# Patient Record
Sex: Male | Born: 1945
Health system: Southern US, Community
[De-identification: ages and names within clinical notes are randomized; demographics above are authoritative.]

## PROBLEM LIST (undated history)

## (undated) DIAGNOSIS — N183 Chronic kidney disease, stage 3 unspecified: Secondary | ICD-10-CM

## (undated) DIAGNOSIS — I10 Essential (primary) hypertension: Secondary | ICD-10-CM

## (undated) DIAGNOSIS — Z0189 Encounter for other specified special examinations: Secondary | ICD-10-CM

## (undated) DIAGNOSIS — D332 Benign neoplasm of brain, unspecified: Secondary | ICD-10-CM

## (undated) DIAGNOSIS — J9600 Acute respiratory failure, unspecified whether with hypoxia or hypercapnia: Secondary | ICD-10-CM

## (undated) DIAGNOSIS — N189 Chronic kidney disease, unspecified: Secondary | ICD-10-CM

## (undated) DIAGNOSIS — J189 Pneumonia, unspecified organism: Secondary | ICD-10-CM

## (undated) DIAGNOSIS — I469 Cardiac arrest, cause unspecified: Secondary | ICD-10-CM

## (undated) DIAGNOSIS — Z452 Encounter for adjustment and management of vascular access device: Secondary | ICD-10-CM

## (undated) DIAGNOSIS — R0602 Shortness of breath: Secondary | ICD-10-CM

## (undated) DIAGNOSIS — I4901 Ventricular fibrillation: Secondary | ICD-10-CM

## (undated) DIAGNOSIS — I5020 Unspecified systolic (congestive) heart failure: Secondary | ICD-10-CM

## (undated) DIAGNOSIS — Z86711 Personal history of pulmonary embolism: Secondary | ICD-10-CM

## (undated) DIAGNOSIS — I499 Cardiac arrhythmia, unspecified: Secondary | ICD-10-CM

## (undated) HISTORY — DX: Cardiac arrest, cause unspecified: I49.01

## (undated) HISTORY — DX: Chronic kidney disease, unspecified: N18.9

## (undated) HISTORY — DX: Pneumonia, unspecified organism: J18.9

## (undated) HISTORY — DX: Cardiac arrhythmia, unspecified: I49.9

## (undated) HISTORY — DX: Cardiac arrest, cause unspecified: I46.9

## (undated) HISTORY — DX: Unspecified systolic (congestive) heart failure: I50.20

## (undated) HISTORY — DX: Acute respiratory failure, unspecified whether with hypoxia or hypercapnia: J96.00

## (undated) HISTORY — DX: Encounter for other specified special examinations: Z01.89

## (undated) HISTORY — DX: Shortness of breath: R06.02

## (undated) HISTORY — DX: Chronic kidney disease, stage 3 unspecified: N18.30

## (undated) HISTORY — DX: Encounter for adjustment and management of vascular access device: Z45.2

## (undated) HISTORY — DX: Personal history of pulmonary embolism: Z86.711

## (undated) HISTORY — PX: CARDIAC CATHETERIZATION: SHX172

## (undated) HISTORY — DX: Benign neoplasm of brain, unspecified: D33.2

## (undated) HISTORY — PX: APPENDECTOMY: SHX54

## (undated) HISTORY — PX: HERNIA REPAIR: SHX51

---

## 2016-01-12 DIAGNOSIS — R42 Dizziness and giddiness: Secondary | ICD-10-CM | POA: Diagnosis not present

## 2016-01-12 DIAGNOSIS — D496 Neoplasm of unspecified behavior of brain: Secondary | ICD-10-CM | POA: Diagnosis not present

## 2016-03-30 DIAGNOSIS — E785 Hyperlipidemia, unspecified: Secondary | ICD-10-CM | POA: Diagnosis not present

## 2016-03-30 DIAGNOSIS — R42 Dizziness and giddiness: Secondary | ICD-10-CM | POA: Diagnosis not present

## 2016-03-30 DIAGNOSIS — E782 Mixed hyperlipidemia: Secondary | ICD-10-CM | POA: Diagnosis not present

## 2016-03-30 DIAGNOSIS — I2510 Atherosclerotic heart disease of native coronary artery without angina pectoris: Secondary | ICD-10-CM | POA: Diagnosis not present

## 2016-03-30 DIAGNOSIS — I447 Left bundle-branch block, unspecified: Secondary | ICD-10-CM | POA: Diagnosis not present

## 2016-03-30 DIAGNOSIS — I10 Essential (primary) hypertension: Secondary | ICD-10-CM | POA: Diagnosis not present

## 2016-03-30 DIAGNOSIS — N400 Enlarged prostate without lower urinary tract symptoms: Secondary | ICD-10-CM | POA: Diagnosis not present

## 2016-03-30 DIAGNOSIS — I82409 Acute embolism and thrombosis of unspecified deep veins of unspecified lower extremity: Secondary | ICD-10-CM | POA: Diagnosis not present

## 2016-04-27 DIAGNOSIS — I2510 Atherosclerotic heart disease of native coronary artery without angina pectoris: Secondary | ICD-10-CM | POA: Diagnosis not present

## 2016-04-27 DIAGNOSIS — I447 Left bundle-branch block, unspecified: Secondary | ICD-10-CM | POA: Diagnosis not present

## 2016-05-23 DIAGNOSIS — D496 Neoplasm of unspecified behavior of brain: Secondary | ICD-10-CM | POA: Diagnosis not present

## 2016-05-25 DIAGNOSIS — D496 Neoplasm of unspecified behavior of brain: Secondary | ICD-10-CM | POA: Diagnosis not present

## 2016-05-25 DIAGNOSIS — D320 Benign neoplasm of cerebral meninges: Secondary | ICD-10-CM | POA: Diagnosis not present

## 2016-06-17 DIAGNOSIS — I447 Left bundle-branch block, unspecified: Secondary | ICD-10-CM | POA: Diagnosis not present

## 2016-06-17 DIAGNOSIS — E785 Hyperlipidemia, unspecified: Secondary | ICD-10-CM | POA: Diagnosis not present

## 2016-06-17 DIAGNOSIS — I10 Essential (primary) hypertension: Secondary | ICD-10-CM | POA: Diagnosis not present

## 2016-06-17 DIAGNOSIS — I2510 Atherosclerotic heart disease of native coronary artery without angina pectoris: Secondary | ICD-10-CM | POA: Diagnosis not present

## 2016-08-11 DIAGNOSIS — Z23 Encounter for immunization: Secondary | ICD-10-CM | POA: Diagnosis not present

## 2016-10-13 DIAGNOSIS — I2510 Atherosclerotic heart disease of native coronary artery without angina pectoris: Secondary | ICD-10-CM | POA: Diagnosis not present

## 2016-10-17 DIAGNOSIS — E785 Hyperlipidemia, unspecified: Secondary | ICD-10-CM | POA: Diagnosis not present

## 2016-10-17 DIAGNOSIS — I447 Left bundle-branch block, unspecified: Secondary | ICD-10-CM | POA: Diagnosis not present

## 2016-10-17 DIAGNOSIS — I2510 Atherosclerotic heart disease of native coronary artery without angina pectoris: Secondary | ICD-10-CM | POA: Diagnosis not present

## 2016-10-17 DIAGNOSIS — E781 Pure hyperglyceridemia: Secondary | ICD-10-CM | POA: Diagnosis not present

## 2016-10-17 DIAGNOSIS — I10 Essential (primary) hypertension: Secondary | ICD-10-CM | POA: Diagnosis not present

## 2016-11-14 DIAGNOSIS — I447 Left bundle-branch block, unspecified: Secondary | ICD-10-CM | POA: Diagnosis not present

## 2017-01-13 DIAGNOSIS — M545 Low back pain: Secondary | ICD-10-CM | POA: Diagnosis not present

## 2017-02-15 DIAGNOSIS — L821 Other seborrheic keratosis: Secondary | ICD-10-CM | POA: Diagnosis not present

## 2017-04-17 DIAGNOSIS — I447 Left bundle-branch block, unspecified: Secondary | ICD-10-CM | POA: Diagnosis not present

## 2017-04-17 DIAGNOSIS — I10 Essential (primary) hypertension: Secondary | ICD-10-CM | POA: Diagnosis not present

## 2017-04-17 DIAGNOSIS — E781 Pure hyperglyceridemia: Secondary | ICD-10-CM | POA: Diagnosis not present

## 2017-04-17 DIAGNOSIS — I2510 Atherosclerotic heart disease of native coronary artery without angina pectoris: Secondary | ICD-10-CM | POA: Diagnosis not present

## 2017-04-17 DIAGNOSIS — E785 Hyperlipidemia, unspecified: Secondary | ICD-10-CM | POA: Diagnosis not present

## 2017-06-23 DIAGNOSIS — Z23 Encounter for immunization: Secondary | ICD-10-CM | POA: Diagnosis not present

## 2017-12-04 ENCOUNTER — Emergency Department (HOSPITAL_BASED_OUTPATIENT_CLINIC_OR_DEPARTMENT_OTHER): Payer: Medicare Other

## 2017-12-04 ENCOUNTER — Other Ambulatory Visit: Payer: Self-pay

## 2017-12-04 ENCOUNTER — Encounter (HOSPITAL_BASED_OUTPATIENT_CLINIC_OR_DEPARTMENT_OTHER): Payer: Self-pay | Admitting: Emergency Medicine

## 2017-12-04 ENCOUNTER — Observation Stay (HOSPITAL_BASED_OUTPATIENT_CLINIC_OR_DEPARTMENT_OTHER)
Admission: EM | Admit: 2017-12-04 | Discharge: 2017-12-05 | Disposition: A | Discharge status: Home / Self Care | Payer: Medicare Other | Attending: Internal Medicine | Admitting: Internal Medicine

## 2017-12-04 DIAGNOSIS — R9431 Abnormal electrocardiogram [ECG] [EKG]: Secondary | ICD-10-CM | POA: Insufficient documentation

## 2017-12-04 DIAGNOSIS — I493 Ventricular premature depolarization: Secondary | ICD-10-CM | POA: Insufficient documentation

## 2017-12-04 DIAGNOSIS — I700 Atherosclerosis of aorta: Secondary | ICD-10-CM | POA: Insufficient documentation

## 2017-12-04 DIAGNOSIS — Z86711 Personal history of pulmonary embolism: Secondary | ICD-10-CM | POA: Insufficient documentation

## 2017-12-04 DIAGNOSIS — R079 Chest pain, unspecified: Secondary | ICD-10-CM

## 2017-12-04 DIAGNOSIS — R911 Solitary pulmonary nodule: Secondary | ICD-10-CM

## 2017-12-04 DIAGNOSIS — I2510 Atherosclerotic heart disease of native coronary artery without angina pectoris: Secondary | ICD-10-CM | POA: Insufficient documentation

## 2017-12-04 DIAGNOSIS — I447 Left bundle-branch block, unspecified: Secondary | ICD-10-CM | POA: Insufficient documentation

## 2017-12-04 DIAGNOSIS — Z79899 Other long term (current) drug therapy: Secondary | ICD-10-CM | POA: Insufficient documentation

## 2017-12-04 DIAGNOSIS — I10 Essential (primary) hypertension: Secondary | ICD-10-CM | POA: Insufficient documentation

## 2017-12-04 DIAGNOSIS — R0789 Other chest pain: Secondary | ICD-10-CM | POA: Diagnosis not present

## 2017-12-04 HISTORY — DX: Essential (primary) hypertension: I10

## 2017-12-04 HISTORY — DX: Chest pain, unspecified: R07.9

## 2017-12-04 LAB — BASIC METABOLIC PANEL
ANION GAP: 8 (ref 5–15)
BUN: 24 mg/dL — ABNORMAL HIGH (ref 6–20)
CHLORIDE: 105 mmol/L (ref 101–111)
CO2: 23 mmol/L (ref 22–32)
Calcium: 8.8 mg/dL — ABNORMAL LOW (ref 8.9–10.3)
Creatinine, Ser: 1.38 mg/dL — ABNORMAL HIGH (ref 0.61–1.24)
GFR calc non Af Amer: 49 mL/min — ABNORMAL LOW (ref >60)
GFR, EST AFRICAN AMERICAN: 57 mL/min — AB (ref >60)
Glucose, Bld: 122 mg/dL — ABNORMAL HIGH (ref 65–99)
Potassium: 3.6 mmol/L (ref 3.5–5.1)
Sodium: 136 mmol/L (ref 135–145)

## 2017-12-04 LAB — CBC WITH DIFFERENTIAL/PLATELET
BASOS ABS: 0 10*3/uL (ref 0.0–0.1)
BASOS PCT: 0 %
Eosinophils Absolute: 0.1 10*3/uL (ref 0.0–0.7)
Eosinophils Relative: 2 %
HEMATOCRIT: 39.2 % (ref 39.0–52.0)
Hemoglobin: 13.5 g/dL (ref 13.0–17.0)
Lymphocytes Relative: 35 %
Lymphs Abs: 2.3 10*3/uL (ref 0.7–4.0)
MCH: 30.9 pg (ref 26.0–34.0)
MCHC: 34.4 g/dL (ref 30.0–36.0)
MCV: 89.7 fL (ref 78.0–100.0)
MONO ABS: 0.6 10*3/uL (ref 0.1–1.0)
MONOS PCT: 9 %
NEUTROS ABS: 3.5 10*3/uL (ref 1.7–7.7)
Neutrophils Relative %: 54 %
Platelets: 242 10*3/uL (ref 150–400)
RBC: 4.37 MIL/uL (ref 4.22–5.81)
RDW: 13.4 % (ref 11.5–15.5)
WBC: 6.5 10*3/uL (ref 4.0–10.5)

## 2017-12-04 LAB — TROPONIN I: Troponin I: <0.03 ng/mL (ref <0.03)

## 2017-12-04 MED ORDER — ASPIRIN 81 MG PO CHEW
324.0000 mg | CHEWABLE_TABLET | Freq: Once | ORAL | Status: AC
  Administered 2017-12-04: 324 mg via ORAL
  Filled 2017-12-04: qty 4

## 2017-12-04 MED ORDER — IOPAMIDOL (ISOVUE-370) INJECTION 76%
100.0000 mL | Freq: Once | INTRAVENOUS | Status: AC | PRN
  Administered 2017-12-04: 80 mL via INTRAVENOUS

## 2017-12-04 MED ORDER — KETOROLAC TROMETHAMINE 30 MG/ML IJ SOLN
15.0000 mg | Freq: Once | INTRAMUSCULAR | Status: AC
  Administered 2017-12-04: 15 mg via INTRAVENOUS
  Filled 2017-12-04: qty 1

## 2017-12-04 NOTE — ED Notes (Signed)
Pt. Wife came to RN and asked about Pt. Being admitted RN explained that it is a process and as soon as I hear about a bed I  Will come to them and let them know.

## 2017-12-04 NOTE — ED Provider Notes (Signed)
[facility] Provider Note   CSN: [id number] Arrival date & time: 12/04/17  1100     History   Chief Complaint Chief Complaint  Patient presents with  . Chest Pain    HPI Timothy Moran is a 72 y.o. male.  The history is provided by the patient.  Chest Pain   This is a new problem. The current episode started more than 2 days ago. The problem occurs rarely. The problem has not changed since onset.Associated with: UNKNOWN. The pain is present in the substernal region. The pain is moderate. The quality of the pain is described as dull. The pain does not radiate. Pertinent negatives include no abdominal pain, no back pain, no claudication, no cough, no diaphoresis, no dizziness, no exertional chest pressure, no fever, no headaches, no hemoptysis, no irregular heartbeat, no leg pain, no lower extremity edema, no malaise/fatigue, no nausea, no near-syncope, no numbness, no orthopnea, no palpitations, no PND, no shortness of breath, no sputum production, no syncope, no vomiting and no weakness. He has tried nothing for the symptoms. The treatment provided no relief. Risk factors include being elderly and male gender.  His past medical history is significant for hypertension and PE.  Pertinent negatives for past medical history include no valve disorder.  Pertinent negatives for family medical history include: no early MI.  Procedure history is negative for cardiac catheterization.    Past Medical History:  Diagnosis Date  . Hypertension     There are no active problems to display for this patient.     Home Medications    Prior to Admission medications   Medication Sig Start Date End Date Taking? Authorizing Provider  losartan (COZAAR) 25 MG tablet Take 25 mg by mouth daily.   Yes [provider]    Family History History reviewed. No pertinent family history.  Social History Social History   Tobacco Use  . Smoking status: Never  Smoker  . Smokeless tobacco: Never Used  Substance Use Topics  . Alcohol use: Not on file  . Drug use: Not on file     Allergies   Patient has no known allergies.   Review of Systems Review of Systems  Constitutional: Negative for diaphoresis, fever and malaise/fatigue.  Respiratory: Negative for cough, hemoptysis, sputum production and shortness of breath.   Cardiovascular: Positive for chest pain. Negative for palpitations, orthopnea, claudication, leg swelling, syncope, PND and near-syncope.  Gastrointestinal: Negative for abdominal pain, nausea and vomiting.  Musculoskeletal: Negative for back pain.  Neurological: Negative for dizziness, weakness, numbness and headaches.  All other systems reviewed and are negative.    Physical Exam Updated Vital Signs BP 128/87 (BP Location: Right Arm)   Pulse (!) 52   Temp 98.2 F (36.8 C) (Oral)   Resp 18   Ht 6\' 2"  (1.88 m)   Wt 95.3 kg (210 lb)   SpO2 95%   BMI 26.96 kg/m   Physical Exam  Constitutional: He is oriented to person, place, and time. He appears well-developed and well-nourished. No distress.  HENT:  Head: Normocephalic and atraumatic.  Mouth/Throat: No oropharyngeal exudate.  Eyes: Pupils are equal, round, and reactive to light. Conjunctivae are normal.  Neck: Normal range of motion. Neck supple.  Cardiovascular: Normal rate, regular rhythm, normal heart sounds and intact distal pulses.  Pulmonary/Chest: Effort normal and breath sounds normal. No stridor. He has no wheezes. He has no rales.  Abdominal: Soft. Bowel sounds are normal. He exhibits no mass. There  is no tenderness. There is no rebound and no guarding.  Musculoskeletal: Normal range of motion.  Neurological: He is alert and oriented to person, place, and time. He displays normal reflexes.  Skin: Skin is warm and dry. Capillary refill takes less than 2 seconds.  Psychiatric: He has a normal mood and affect.     ED Treatments / Results  Labs (all  labs ordered are listed, but only abnormal results are displayed)  Results for orders placed or performed during the hospital encounter of 12/04/17  CBC with Differential/Platelet  Result Value Ref Range   WBC 6.5 4.0 - 10.5 K/uL   RBC 4.37 4.22 - 5.81 MIL/uL   Hemoglobin 13.5 13.0 - 17.0 g/dL   HCT 39.2 39.0 - 52.0 %   MCV 89.7 78.0 - 100.0 fL   MCH 30.9 26.0 - 34.0 pg   MCHC 34.4 30.0 - 36.0 g/dL   RDW 13.4 11.5 - 15.5 %   Platelets 242 150 - 400 K/uL   Neutrophils Relative % 54 %   Neutro Abs 3.5 1.7 - 7.7 K/uL   Lymphocytes Relative 35 %   Lymphs Abs 2.3 0.7 - 4.0 K/uL   Monocytes Relative 9 %   Monocytes Absolute 0.6 0.1 - 1.0 K/uL   Eosinophils Relative 2 %   Eosinophils Absolute 0.1 0.0 - 0.7 K/uL   Basophils Relative 0 %   Basophils Absolute 0.0 0.0 - 0.1 K/uL  Basic metabolic panel  Result Value Ref Range   Sodium 136 135 - 145 mmol/L   Potassium 3.6 3.5 - 5.1 mmol/L   Chloride 105 101 - 111 mmol/L   CO2 23 22 - 32 mmol/L   Glucose, Bld 122 (H) 65 - 99 mg/dL   BUN 24 (H) 6 - 20 mg/dL   Creatinine, Ser 1.38 (H) 0.61 - 1.24 mg/dL   Calcium 8.8 (L) 8.9 - 10.3 mg/dL   GFR calc non Af Amer 49 (L) >60 mL/min   GFR calc Af Amer 57 (L) >60 mL/min   Anion gap 8 5 - 15  Troponin I  Result Value Ref Range   Troponin I <0.03 <0.03 ng/mL   Ct Angio Chest Pe W And/or Wo Contrast  Result Date: 12/04/2017 CLINICAL DATA:  Chest pain EXAM: CT ANGIOGRAPHY CHEST WITH CONTRAST TECHNIQUE: Multidetector CT imaging of the chest was performed using the standard protocol during bolus administration of intravenous contrast. Multiplanar CT image reconstructions and MIPs were obtained to evaluate the vascular anatomy. CONTRAST:  30mL ISOVUE-370 IOPAMIDOL (ISOVUE-370) INJECTION 76% COMPARISON:  None. FINDINGS: Cardiovascular: There is no demonstrable pulmonary embolus. There is no thoracic aortic aneurysm. No dissection is evident. It should be noted that the contrast bolus is less than optimal  for assessment for potential dissection. There are foci of atherosclerotic calcification in visualized great vessels. There is also atherosclerotic calcification in the aorta. There are multiple foci of coronary artery calcification. There is no pericardial effusion or pericardial thickening evident. Mediastinum/Nodes: Thyroid appears normal. There are subcentimeter mediastinal and hilar lymph nodes. There is no adenopathy size criteria in the thoracic region. No esophageal lesions are evident. Lungs/Pleura: There is a 1.0 x 0.8 x 0.7 cm nodular opacity in the posterior segment of the right upper lobe. This opacity is best seen on axial slice 24 series 5, coronal slice 96 series 7, and sagittal slice 55 series 8. No other focal parenchymal lung nodular lesions are evident. There is slight bibasilar atelectasis. No pleural effusion or pleural thickening evident.  Upper Abdomen: There is mild reflux of contrast into the inferior vena cava and hepatic veins. Visualized upper abdominal structures otherwise appear unremarkable. Musculoskeletal: There is degenerative change in the thoracic spine with a degree of diffuse idiopathic skeletal hyperostosis. No blastic or lytic bone lesions are evident. No chest wall lesions are appreciable. Review of the MIP images confirms the above findings. IMPRESSION: 1. 1.0 x 0.8 x 0.7 cm nodular opacity in the posterior segment right upper lobe. Consider one of the following in 3 months for both low-risk and high-risk individuals: (a) repeat chest CT, (b) follow-up PET-CT, or (c) tissue sampling. This recommendation follows the consensus statement: Guidelines for Management of Incidental Pulmonary Nodules Detected on CT Images: From the Fleischner Society 2017; Radiology 2017; 284:228-243. 2. No demonstrable pulmonary embolus. No thoracic aortic aneurysm. No dissection seen, although it must be noted that the contrast bolus is less than optimal for assessment for potential thoracic  aortic dissection. 3. No evident thoracic adenopathy by size criteria. Several subcentimeter mediastinal and hilar lymph nodes are noted. 4. There is aortic atherosclerosis. There is great vessel and coronary artery calcification. 5. Mild reflux of contrast into the inferior vena cava and hepatic veins may indicate a degree of increase in right heart pressure. 6. Diffuse idiopathic skeletal hyperostosis in the thoracic spine noted. Aortic Atherosclerosis (ICD10-I70.0). Electronically Signed   By: Lowella Grip III M.D.   On: 12/04/2017 12:31    EKG EKG Interpretation  Date/Time:  Monday December 04 2017 11:16:45 EDT Ventricular Rate:  60 PR Interval:  196 QRS Duration: 142 QT Interval:  446 QTC Calculation: 446 R Axis:   -39 Text Interpretation:  Sinus rhythm with occasional Premature ventricular complexes Left axis deviation Left bundle branch block Confirmed by Lamara Brecht ([id number]) on 12/04/2017 11:22:28 AM   Radiology Ct Angio Chest Pe W And/or Wo Contrast  Result Date: 12/04/2017 CLINICAL DATA:  Chest pain EXAM: CT ANGIOGRAPHY CHEST WITH CONTRAST TECHNIQUE: Multidetector CT imaging of the chest was performed using the standard protocol during bolus administration of intravenous contrast. Multiplanar CT image reconstructions and MIPs were obtained to evaluate the vascular anatomy. CONTRAST:  69mL ISOVUE-370 IOPAMIDOL (ISOVUE-370) INJECTION 76% COMPARISON:  None. FINDINGS: Cardiovascular: There is no demonstrable pulmonary embolus. There is no thoracic aortic aneurysm. No dissection is evident. It should be noted that the contrast bolus is less than optimal for assessment for potential dissection. There are foci of atherosclerotic calcification in visualized great vessels. There is also atherosclerotic calcification in the aorta. There are multiple foci of coronary artery calcification. There is no pericardial effusion or pericardial thickening evident. Mediastinum/Nodes: Thyroid appears  normal. There are subcentimeter mediastinal and hilar lymph nodes. There is no adenopathy size criteria in the thoracic region. No esophageal lesions are evident. Lungs/Pleura: There is a 1.0 x 0.8 x 0.7 cm nodular opacity in the posterior segment of the right upper lobe. This opacity is best seen on axial slice 24 series 5, coronal slice 96 series 7, and sagittal slice 55 series 8. No other focal parenchymal lung nodular lesions are evident. There is slight bibasilar atelectasis. No pleural effusion or pleural thickening evident. Upper Abdomen: There is mild reflux of contrast into the inferior vena cava and hepatic veins. Visualized upper abdominal structures otherwise appear unremarkable. Musculoskeletal: There is degenerative change in the thoracic spine with a degree of diffuse idiopathic skeletal hyperostosis. No blastic or lytic bone lesions are evident. No chest wall lesions are appreciable. Review of the MIP images confirms the  above findings. IMPRESSION: 1. 1.0 x 0.8 x 0.7 cm nodular opacity in the posterior segment right upper lobe. Consider one of the following in 3 months for both low-risk and high-risk individuals: (a) repeat chest CT, (b) follow-up PET-CT, or (c) tissue sampling. This recommendation follows the consensus statement: Guidelines for Management of Incidental Pulmonary Nodules Detected on CT Images: From the Fleischner Society 2017; Radiology 2017; 284:228-243. 2. No demonstrable pulmonary embolus. No thoracic aortic aneurysm. No dissection seen, although it must be noted that the contrast bolus is less than optimal for assessment for potential thoracic aortic dissection. 3. No evident thoracic adenopathy by size criteria. Several subcentimeter mediastinal and hilar lymph nodes are noted. 4. There is aortic atherosclerosis. There is great vessel and coronary artery calcification. 5. Mild reflux of contrast into the inferior vena cava and hepatic veins may indicate a degree of increase in  right heart pressure. 6. Diffuse idiopathic skeletal hyperostosis in the thoracic spine noted. Aortic Atherosclerosis (ICD10-I70.0). Electronically Signed   By: Lowella Grip III M.D.   On: 12/04/2017 12:31    Procedures Procedures (including critical care time)  Medications Ordered in ED Medications  iopamidol (ISOVUE-370) 76 % injection 100 mL (80 mLs Intravenous Contrast Given 12/04/17 1210)  aspirin chewable tablet 324 mg (324 mg Oral Given 12/04/17 1321)  ketorolac (TORADOL) 30 MG/ML injection 15 mg (15 mg Intravenous Given 12/04/17 1323)       Final Clinical Impressions(s) / ED Diagnoses   Final diagnoses:  Other chest pain  Pulmonary nodule  Abnormal EKG    Heart SCORE IS 6, GIVEN THIS SCORE WILL NEED INPATIENT RULE OUT.    [facility], no doctor in this area and will need medication management   Santana Edell, MD 12/04/17 1429

## 2017-12-04 NOTE — ED Notes (Signed)
Lights turned out for Pt. And comfort measures done for Pt. To assist for Pt. To rest while he awaits transport to [facility].

## 2017-12-04 NOTE — Progress Notes (Signed)
Patient is a 72yo who recently moved to [place] from [place].  Episodic CP lasting minutes x 2-3 days.  No DOE.  He has LBBB - ?new.  H/o PE, CTA negative.  Negative troponin.  Dr. Randal Buba does not feel comfortable with outpatient f/u.  Will observe on telemetry.  It is not clear that inpatient cardiology consult will be necessary based on the current information available.  Carlyon Shadow, M.D.

## 2017-12-04 NOTE — ED Notes (Signed)
Pt. Wife name is Delcie Roch and her number is [phone]

## 2017-12-04 NOTE — ED Triage Notes (Addendum)
Reports intermittent left sided chest pain, worsening in length today for the past week.  Denies shortness of breath, nausea, vomiting.  Sent from urgent care

## 2017-12-04 NOTE — ED Notes (Signed)
Placed pink mattress under Pt. For comfort due to Pt. Staying in ED so long.  Pt. Has been offered food and drink due to long stay in ED.

## 2017-12-05 DIAGNOSIS — R0789 Other chest pain: Secondary | ICD-10-CM | POA: Diagnosis not present

## 2017-12-05 LAB — CBC WITH DIFFERENTIAL/PLATELET
BASOS ABS: 0 10*3/uL (ref 0.0–0.1)
BASOS PCT: 0 %
EOS ABS: 0.2 10*3/uL (ref 0.0–0.7)
EOS PCT: 2 %
HEMATOCRIT: 40.2 % (ref 39.0–52.0)
Hemoglobin: 13.7 g/dL (ref 13.0–17.0)
Lymphocytes Relative: 30 %
Lymphs Abs: 1.9 10*3/uL (ref 0.7–4.0)
MCH: 30.9 pg (ref 26.0–34.0)
MCHC: 34.1 g/dL (ref 30.0–36.0)
MCV: 90.7 fL (ref 78.0–100.0)
MONO ABS: 0.5 10*3/uL (ref 0.1–1.0)
Monocytes Relative: 7 %
NEUTROS ABS: 3.8 10*3/uL (ref 1.7–7.7)
Neutrophils Relative %: 60 %
PLATELETS: 245 10*3/uL (ref 150–400)
RBC: 4.43 MIL/uL (ref 4.22–5.81)
RDW: 13.8 % (ref 11.5–15.5)
WBC: 6.4 10*3/uL (ref 4.0–10.5)

## 2017-12-05 LAB — BASIC METABOLIC PANEL
Anion gap: 9 (ref 5–15)
BUN: 27 mg/dL — ABNORMAL HIGH (ref 6–20)
CALCIUM: 8.9 mg/dL (ref 8.9–10.3)
CO2: 24 mmol/L (ref 22–32)
CREATININE: 1.35 mg/dL — AB (ref 0.61–1.24)
Chloride: 107 mmol/L (ref 101–111)
GFR, EST AFRICAN AMERICAN: 58 mL/min — AB (ref >60)
GFR, EST NON AFRICAN AMERICAN: 50 mL/min — AB (ref >60)
Glucose, Bld: 147 mg/dL — ABNORMAL HIGH (ref 65–99)
Potassium: 3.7 mmol/L (ref 3.5–5.1)
SODIUM: 140 mmol/L (ref 135–145)

## 2017-12-05 LAB — TROPONIN I: Troponin I: <0.03 ng/mL (ref <0.03)

## 2017-12-05 NOTE — ED Notes (Signed)
ED Provider at bedside. 

## 2017-12-05 NOTE — ED Provider Notes (Signed)
71 yo M who is awaiting admission for moderate risk chest pain with a heart score of 6.  The patient has had no continued symptoms while in the ED, he had a second troponin that was repeated 24 hours after he had had his first one.  Both of these are negative.  I feel that this is unlikely to be a acute cardiac event and feel that this is essentially ruled him out.  He had a PE scan done yesterday that was negative for PE as well.  The patient boarded in the emergency department for over 24 hours.  I discussed with him the risks and benefits of continued waiting for admission for possible stress testing.  At this time the patient elects for outpatient follow-up with a cardiologist.  Gave him the information for the cardiologist on call.  1:14 PM:  I have discussed the diagnosis/risks/treatment options with the patient and family and believe the pt to be eligible for discharge home to follow-up with Cards. We also discussed returning to the ED immediately if new or worsening sx occur. We discussed the sx which are most concerning (e.g., sudden worsening pain, fever, inability to tolerate by mouth) that necessitate immediate return. Medications administered to the patient during their visit and any new prescriptions provided to the patient are listed below.  Medications given during this visit Medications  iopamidol (ISOVUE-370) 76 % injection 100 mL (80 mLs Intravenous Contrast Given 12/04/17 1210)  aspirin chewable tablet 324 mg (324 mg Oral Given 12/04/17 1321)  ketorolac (TORADOL) 30 MG/ML injection 15 mg (15 mg Intravenous Given 12/04/17 1323)     The patient appears reasonably screen and/or stabilized for discharge and I doubt any other medical condition or other [facility] requiring further screening, evaluation, or treatment in the ED at this time prior to discharge.     Deno Etienne, DO 12/05/17 1314

## 2017-12-05 NOTE — Discharge Instructions (Signed)
Try Zantac or Pepcid twice a day for the next week.  Please follow-up with a cardiologist.  He had a lung nodule that was seen incidentally on your CT scan.  This needs follow-up with your family doctor.  You need a repeat image.

## 2017-12-13 DIAGNOSIS — N401 Enlarged prostate with lower urinary tract symptoms: Secondary | ICD-10-CM | POA: Diagnosis not present

## 2017-12-13 DIAGNOSIS — Z86711 Personal history of pulmonary embolism: Secondary | ICD-10-CM | POA: Diagnosis not present

## 2017-12-13 DIAGNOSIS — I447 Left bundle-branch block, unspecified: Secondary | ICD-10-CM | POA: Diagnosis not present

## 2017-12-13 DIAGNOSIS — N183 Chronic kidney disease, stage 3 (moderate): Secondary | ICD-10-CM | POA: Diagnosis not present

## 2017-12-13 DIAGNOSIS — D332 Benign neoplasm of brain, unspecified: Secondary | ICD-10-CM | POA: Diagnosis not present

## 2017-12-13 DIAGNOSIS — R079 Chest pain, unspecified: Secondary | ICD-10-CM | POA: Diagnosis not present

## 2017-12-13 DIAGNOSIS — I10 Essential (primary) hypertension: Secondary | ICD-10-CM | POA: Diagnosis not present

## 2017-12-19 DIAGNOSIS — D332 Benign neoplasm of brain, unspecified: Secondary | ICD-10-CM | POA: Insufficient documentation

## 2017-12-19 DIAGNOSIS — N183 Chronic kidney disease, stage 3 unspecified: Secondary | ICD-10-CM | POA: Insufficient documentation

## 2017-12-19 DIAGNOSIS — Z86711 Personal history of pulmonary embolism: Secondary | ICD-10-CM | POA: Insufficient documentation

## 2017-12-19 DIAGNOSIS — I447 Left bundle-branch block, unspecified: Secondary | ICD-10-CM | POA: Insufficient documentation

## 2017-12-19 HISTORY — DX: Personal history of pulmonary embolism: Z86.711

## 2017-12-19 HISTORY — DX: Left bundle-branch block, unspecified: I44.7

## 2017-12-19 HISTORY — DX: Benign neoplasm of brain, unspecified: D33.2

## 2017-12-19 HISTORY — DX: Chronic kidney disease, stage 3 unspecified: N18.30

## 2017-12-22 ENCOUNTER — Ambulatory Visit (INDEPENDENT_AMBULATORY_CARE_PROVIDER_SITE_OTHER): Payer: Medicare Other | Admitting: Cardiology

## 2017-12-22 ENCOUNTER — Encounter: Payer: Self-pay | Admitting: Cardiology

## 2017-12-22 VITALS — BP 122/72 | HR 66 | Ht 73.0 in | Wt 212.8 lb

## 2017-12-22 DIAGNOSIS — R072 Precordial pain: Secondary | ICD-10-CM | POA: Diagnosis not present

## 2017-12-22 DIAGNOSIS — I493 Ventricular premature depolarization: Secondary | ICD-10-CM

## 2017-12-22 DIAGNOSIS — N183 Chronic kidney disease, stage 3 unspecified: Secondary | ICD-10-CM

## 2017-12-22 DIAGNOSIS — I447 Left bundle-branch block, unspecified: Secondary | ICD-10-CM

## 2017-12-22 DIAGNOSIS — R001 Bradycardia, unspecified: Secondary | ICD-10-CM | POA: Diagnosis not present

## 2017-12-22 HISTORY — DX: Bradycardia, unspecified: R00.1

## 2017-12-22 MED ORDER — ASPIRIN EC 81 MG PO TBEC: 81.0000 mg | DELAYED_RELEASE_TABLET | Freq: Every day | ORAL | 3 refills | Status: DC

## 2017-12-22 NOTE — Patient Instructions (Signed)
Medication Instructions:  Your physician has recommended you make the following change in your medication:  START aspirin 81 mg daily   Labwork: Your physician recommends that you return for lab work when you come back to the office for your holter monitor appointment: lipid panel. Please fast before this appointment.   Testing/Procedures: You had an EKG today.   Your physician has requested that you have an echocardiogram. Echocardiography is a painless test that uses sound waves to create images of your heart. It provides your doctor with information about the size and shape of your heart and how well your heart's chambers and valves are working. This procedure takes approximately one hour. There are no restrictions for this procedure.  Your physician has recommended that you wear a holter monitor. Holter monitors are medical devices that record the heart's electrical activity. Doctors most often use these monitors to diagnose arrhythmias. Arrhythmias are problems with the speed or rhythm of the heartbeat. The monitor is a small, portable device. You can wear one while you do your normal daily activities. This is usually used to diagnose what is causing palpitations/syncope (passing out). Wear for 24 hours.   Follow-Up: Your physician recommends that you schedule a follow-up appointment in: 1 month.  Any Other Special Instructions Will Be Listed Below (If Applicable).     If you need a refill on your cardiac medications before your next appointment, please call your pharmacy.

## 2017-12-22 NOTE — Progress Notes (Signed)
Cardiology Consultation:    Date:  12/22/2017   ID:  Timothy Moran, DOB Feb 24, 1946, MRN [id number]  PCP:  Timothy Kid, MD  Cardiologist:  Timothy Campus, MD   Referring MD: Timothy Kid, MD   Chief Complaint  Patient presents with  . Chest Pain  I have chest pain  History of Present Illness:    Timothy Moran is a 72 y.o. male who is being seen today for the evaluation of atypical chest pain at the request of Via, [name], MD.  He recently relocated from [place].  Few days ago he started experiencing some chest pain.  Pain is short lasting only split-second not related to exertion happening at different situation 1 of the day he woke up in the morning he had back pain.  Again very short lasting not related to exercise.  There is no shortness of breath no tightness squeezing pressure burning in the chest.  He does have history of pulmonary emboli that happened in 2010 and then a year later.  It was assessed as being provoked pulmonary emboli secondary to the fact that he travels a lot and spent a lot of time in the plane.  Interestingly his cardiologist taking care of him wanted to continue his anticoagulation and indefinitely.  Patient did not like this idea make a long story short he ended up going hypercoagulation workup which was perfectly normal and that anticoagulant she has been discontinued.  He does not travel anymore.  Denies having any shortness of breath right now the symptoms while he was having his pulmonary emboli was shortness of breath.  He is very active he wears Fitbit and every single day he does at least 10,000 steps.  He does not have any exertional chest pain or shortness of breath.  Is not have swelling of lower extremities no pain in the calf. He never have any heart trouble.  He was told however a few years ago he does have extrasystole now he reports when he check his blood pressure he would feel sometimes skipped beats.  Past Medical History:  Diagnosis Date    . Benign brain tumor ([place])   . Chronic kidney disease    Stage 3  . History of pulmonary embolus (PE)   . Hypertension       Current Medications: Current Meds  Medication Sig  . B Complex-C (SUPER B COMPLEX PO) Take 1 tablet by mouth daily.  . cetirizine (ZYRTEC) 10 MG tablet Take 10 mg by mouth daily.  . Cholecalciferol (VITAMIN D3) 50000 units TABS Take 1 tablet by mouth once a week.  . losartan (COZAAR) 25 MG tablet Take 1 tablet by mouth daily.  . Magnesium 400 MG CAPS Take 400 mg by mouth daily.   Marland Kitchen oxymetazoline (AFRIN) 0.05 % nasal spray Place 1 spray into both nostrils daily as needed for congestion.  . Saw Palmetto, Serenoa repens, (SAW PALMETTO BERRY PO) Take 1 tablet by mouth daily.     Allergies:   Patient has no known allergies.   Social History   Socioeconomic History  . Marital status: Married    Spouse name: Not on file  . Number of children: Not on file  . Years of education: Not on file  . Highest education level: Not on file  Occupational History  . Not on file  Social Needs  . Financial resource strain: Not on file  . Food insecurity:    Worry: Not on file    Inability: Not on file  .  Transportation needs:    Medical: Not on file    Non-medical: Not on file  Tobacco Use  . Smoking status: Never Smoker  . Smokeless tobacco: Never Used  Substance and Sexual Activity  . Alcohol use: Yes  . Drug use: Never  . Sexual activity: Not on file  Lifestyle  . Physical activity:    Days per week: Not on file    Minutes per session: Not on file  . Stress: Not on file  Relationships  . Social connections:    Talks on phone: Not on file    Gets together: Not on file    Attends religious service: Not on file    Active member of club or organization: Not on file    Attends meetings of clubs or organizations: Not on file    Relationship status: Not on file  Other Topics Concern  . Not on file  Social History Narrative  . Not on file     Family  History: The patient's family history includes Brain cancer in his sister; Diabetes in his mother; Leukemia in his brother; Melanoma in his brother. ROS:   Please see the history of present illness.    All 14 point review of systems negative except as described per history of present illness.  EKGs/Labs/Other Studies Reviewed:    The following studies were reviewed today:   EKG:  EKG is  ordered today.  The ekg ordered today demonstrates EKG done in our office today shows sinus bradycardia PVCs left bundle branch block  Recent Labs: 12/05/2017: BUN 27; Creatinine, Ser 1.35; Hemoglobin 13.7; Platelets 245; Potassium 3.7; Sodium 140  Recent Lipid Panel No results found for: CHOL, TRIG, HDL, CHOLHDL, VLDL, LDLCALC, LDLDIRECT  Physical Exam:    VS:  BP 122/72 (BP Location: Right Arm)   Pulse 66   Ht 6\' 1"  (1.854 m)   Wt 212 lb 12.8 oz (96.5 kg)   SpO2 95%   BMI 28.08 kg/m     Wt Readings from Last 3 Encounters:  12/22/17 212 lb 12.8 oz (96.5 kg)  12/04/17 210 lb (95.3 kg)     GEN:  Well nourished, well developed in no acute distress HEENT: Normal NECK: No JVD; No carotid bruits LYMPHATICS: No lymphadenopathy CARDIAC: RRR, no murmurs, no rubs, no gallops RESPIRATORY:  Clear to auscultation without rales, wheezing or rhonchi  ABDOMEN: Soft, non-tender, non-distended MUSCULOSKELETAL:  No edema; No deformity  SKIN: Warm and dry NEUROLOGIC:  Alert and oriented x 3 PSYCHIATRIC:  Normal affect   ASSESSMENT:    1. Left bundle branch block   2. Chronic kidney disease, stage III (moderate) (HCC)   3. Precordial pain   4. Extrasystole, ventricular   5. Sinus bradycardia    PLAN:    In order of problems listed above:  1. Left bundle branch block: Incidental discovery he is not sure if this is new or old.  He remembers vaguely that he did have some extrasystole and somebody make some comments about abnormal EKG.  He did have a stress test done a few years ago and he was able to  walk on the treadmill which make me to think that he did not have left bundle branch block at that time.  I think the key will be trying to assess the significance of this finding.  Therefore I will ask him to have an echocardiogram.  I will also ask him to wear 24 hours Holter monitor to see how much extrasystole he  has.  In the future after those 2 tests will be done he will most likely require stress test which can be done only one way Lexiscan.  But decision about this test will be made after those first 2 test will come back.  I will ask him to start taking one baby aspirin every single day I will no change any other medications. 2. Chronic kidney disease his creatinine at baseline is 1.35 with GFR of 50.  That need to be followed we need to avoid any nephrotoxic medications. 3. Sinus bradycardia with no episode of syncope.  Still he does have to wear Holter monitor to see if he got any significant slowing down of his heart rate. 4. History of pulmonary emboli which was assessed as being provoked secondary to frequent long flights that he took.  On top of that he did have anticoagulation workup done which was negative we will try to get a copy of that workup.  Would like to see him back in my office in about 1 month or sooner if he has any problem also ask him to let me know if pain change characteristic if you have dizziness or passing out.   Medication Adjustments/Labs and Tests Ordered: Current medicines are reviewed at length with the patient today.  Concerns regarding medicines are outlined above.  No orders of the defined types were placed in this encounter.  No orders of the defined types were placed in this encounter.   Signed, Park Liter, MD, [facility]. 12/22/2017 3:28 PM    [place]

## 2017-12-27 ENCOUNTER — Ambulatory Visit (HOSPITAL_BASED_OUTPATIENT_CLINIC_OR_DEPARTMENT_OTHER)
Admission: RE | Admit: 2017-12-27 | Discharge: 2017-12-27 | Disposition: A | Discharge status: Home / Self Care | Payer: Medicare Other | Source: Ambulatory Visit | Attending: Cardiology | Admitting: Cardiology

## 2017-12-27 ENCOUNTER — Ambulatory Visit: Payer: Medicare Other

## 2017-12-27 DIAGNOSIS — I083 Combined rheumatic disorders of mitral, aortic and tricuspid valves: Secondary | ICD-10-CM | POA: Diagnosis not present

## 2017-12-27 DIAGNOSIS — N183 Chronic kidney disease, stage 3 unspecified: Secondary | ICD-10-CM

## 2017-12-27 DIAGNOSIS — I493 Ventricular premature depolarization: Secondary | ICD-10-CM

## 2017-12-27 DIAGNOSIS — I447 Left bundle-branch block, unspecified: Secondary | ICD-10-CM

## 2017-12-27 DIAGNOSIS — R001 Bradycardia, unspecified: Secondary | ICD-10-CM

## 2017-12-27 DIAGNOSIS — R072 Precordial pain: Secondary | ICD-10-CM

## 2017-12-27 NOTE — Progress Notes (Signed)
Echocardiogram 2D Echocardiogram has been performed.  Joelene Millin 12/27/2017, 3:41 PM

## 2018-01-08 ENCOUNTER — Telehealth: Payer: Self-pay | Admitting: *Deleted

## 2018-01-08 NOTE — Telephone Encounter (Signed)
Reminded patient's wife, Delcie Roch, to have patient come back into the office one day this week for repeat lab work. Monika verbalized understanding. No further questions.

## 2018-01-10 DIAGNOSIS — R072 Precordial pain: Secondary | ICD-10-CM | POA: Diagnosis not present

## 2018-01-10 DIAGNOSIS — N183 Chronic kidney disease, stage 3 (moderate): Secondary | ICD-10-CM | POA: Diagnosis not present

## 2018-01-10 DIAGNOSIS — I447 Left bundle-branch block, unspecified: Secondary | ICD-10-CM | POA: Diagnosis not present

## 2018-01-10 DIAGNOSIS — R001 Bradycardia, unspecified: Secondary | ICD-10-CM | POA: Diagnosis not present

## 2018-01-10 DIAGNOSIS — I493 Ventricular premature depolarization: Secondary | ICD-10-CM | POA: Diagnosis not present

## 2018-01-11 LAB — LIPID PANEL
CHOL/HDL RATIO: 5.1 ratio — AB (ref 0.0–5.0)
Cholesterol, Total: 203 mg/dL — ABNORMAL HIGH (ref 100–199)
HDL: 40 mg/dL (ref >39)
LDL CALC: 116 mg/dL — AB (ref 0–99)
TRIGLYCERIDES: 236 mg/dL — AB (ref 0–149)
VLDL CHOLESTEROL CAL: 47 mg/dL — AB (ref 5–40)

## 2018-01-24 DIAGNOSIS — L247 Irritant contact dermatitis due to plants, except food: Secondary | ICD-10-CM | POA: Diagnosis not present

## 2018-01-24 DIAGNOSIS — I447 Left bundle-branch block, unspecified: Secondary | ICD-10-CM | POA: Diagnosis not present

## 2018-01-24 DIAGNOSIS — D332 Benign neoplasm of brain, unspecified: Secondary | ICD-10-CM | POA: Diagnosis not present

## 2018-01-24 DIAGNOSIS — I10 Essential (primary) hypertension: Secondary | ICD-10-CM | POA: Diagnosis not present

## 2018-01-29 ENCOUNTER — Encounter: Payer: Self-pay | Admitting: Cardiology

## 2018-01-29 ENCOUNTER — Ambulatory Visit (INDEPENDENT_AMBULATORY_CARE_PROVIDER_SITE_OTHER): Payer: Medicare Other | Admitting: Cardiology

## 2018-01-29 ENCOUNTER — Encounter: Payer: Self-pay | Admitting: *Deleted

## 2018-01-29 VITALS — BP 130/66 | HR 68 | Ht 73.0 in | Wt 213.1 lb

## 2018-01-29 DIAGNOSIS — I712 Thoracic aortic aneurysm, without rupture: Secondary | ICD-10-CM | POA: Insufficient documentation

## 2018-01-29 DIAGNOSIS — N183 Chronic kidney disease, stage 3 unspecified: Secondary | ICD-10-CM

## 2018-01-29 DIAGNOSIS — Z86711 Personal history of pulmonary embolism: Secondary | ICD-10-CM | POA: Diagnosis not present

## 2018-01-29 DIAGNOSIS — I7121 Aneurysm of the ascending aorta, without rupture: Secondary | ICD-10-CM

## 2018-01-29 DIAGNOSIS — R001 Bradycardia, unspecified: Secondary | ICD-10-CM | POA: Diagnosis not present

## 2018-01-29 DIAGNOSIS — I447 Left bundle-branch block, unspecified: Secondary | ICD-10-CM

## 2018-01-29 HISTORY — DX: Thoracic aortic aneurysm, without rupture: I71.2

## 2018-01-29 HISTORY — DX: Aneurysm of the ascending aorta, without rupture: I71.21

## 2018-01-29 NOTE — Patient Instructions (Addendum)
Medication Instructions:  Your physician recommends that you continue on your current medications as directed. Please refer to the Current Medication list given to you today.   Labwork: Your physician recommends that you return for lab work today: BMP.  Testing/Procedures: Non-Cardiac CT scanning, (CAT scanning), is a noninvasive, special x-ray that produces cross-sectional images of the body using x-rays and a computer. CT scans help physicians diagnose and treat medical conditions. For some CT exams, a contrast material is used to enhance visibility in the area of the body being studied. CT scans provide greater clarity and reveal more details than regular x-ray exams.  Your physician has requested that you have a lexiscan myoview. For further information please visit [email or web address]. Please follow instruction sheet, as given.   Follow-Up: Your physician recommends that you schedule a follow-up appointment in: 1 month.   If you need a refill on your cardiac medications before your next appointment, please call your pharmacy.   Thank you for choosing [facility]! Robyne Peers, RN [phone]

## 2018-01-29 NOTE — Progress Notes (Signed)
Cardiology Office Note:    Date:  01/29/2018   ID:  Timothy Moran, DOB 1946-01-13, MRN [id number]  PCP:  Dineen Kid, MD  Cardiologist:  Jenne Campus, MD    Referring MD: Dineen Kid, MD   Chief Complaint  Patient presents with  . 1 month follow up  Doing well  History of Present Illness:    Timothy Moran is a 72 y.o. male with left bundle branch block.  Echocardiogram was done which showed ejection fraction 40 to 45%.  He was also identified to have a ascending aortic aneurysm measuring 4.3 cm.  Overall he is asymptomatic no chest pain tightness squeezing pressure burning chest.  He is very active.  He did have to monitor which showed plenty of supraventricular as well as ventricle ectopy.  No sustained arrhythmias.  First-degree AV block with bradycardia but no critical.  I will ask him to have a Lexiscan to make sure there is no inducible ischemia.  Because of the aneurysm of the ascending orotate will schedule him to have a CAT scan of the chest after Chem-7 will be checked.  Past Medical History:  Diagnosis Date  . Benign brain tumor ([place])   . Chronic kidney disease    Stage 3  . History of pulmonary embolus (PE)   . Hypertension       Current Medications: Current Meds  Medication Sig  . aspirin EC 81 MG tablet Take 1 tablet (81 mg total) by mouth daily.  . B Complex-C (SUPER B COMPLEX PO) Take 1 tablet by mouth daily.  . cetirizine (ZYRTEC) 10 MG tablet Take 10 mg by mouth daily.  . Cholecalciferol (VITAMIN D3) 50000 units TABS Take 1 tablet by mouth once a week.  . Magnesium 400 MG CAPS Take 400 mg by mouth daily.   Marland Kitchen oxymetazoline (AFRIN) 0.05 % nasal spray Place 1 spray into both nostrils daily as needed for congestion.  . Saw Palmetto, Serenoa repens, (SAW PALMETTO BERRY PO) Take 1 tablet by mouth daily.  . valsartan (DIOVAN) 160 MG tablet Take 1 tablet by mouth daily.     Allergies:   Patient has no known allergies.   Social History   Socioeconomic  History  . Marital status: Married    Spouse name: Not on file  . Number of children: Not on file  . Years of education: Not on file  . Highest education level: Not on file  Occupational History  . Not on file  Social Needs  . Financial resource strain: Not on file  . Food insecurity:    Worry: Not on file    Inability: Not on file  . Transportation needs:    Medical: Not on file    Non-medical: Not on file  Tobacco Use  . Smoking status: Never Smoker  . Smokeless tobacco: Never Used  Substance and Sexual Activity  . Alcohol use: Yes  . Drug use: Never  . Sexual activity: Not on file  Lifestyle  . Physical activity:    Days per week: Not on file    Minutes per session: Not on file  . Stress: Not on file  Relationships  . Social connections:    Talks on phone: Not on file    Gets together: Not on file    Attends religious service: Not on file    Active member of club or organization: Not on file    Attends meetings of clubs or organizations: Not on file    Relationship status: Not  on file  Other Topics Concern  . Not on file  Social History Narrative  . Not on file     Family History: The patient's family history includes Brain cancer in his sister; Diabetes in his mother; Leukemia in his brother; Melanoma in his brother. ROS:   Please see the history of present illness.    All 14 point review of systems negative except as described per history of present illness  EKGs/Labs/Other Studies Reviewed:      Recent Labs: 12/05/2017: BUN 27; Creatinine, Ser 1.35; Hemoglobin 13.7; Platelets 245; Potassium 3.7; Sodium 140  Recent Lipid Panel    Component Value Date/Time   CHOL 203 (H) 01/10/2018 0000   TRIG 236 (H) 01/10/2018 0000   HDL 40 01/10/2018 0000   CHOLHDL 5.1 (H) 01/10/2018 0000   LDLCALC 116 (H) 01/10/2018 0000    Physical Exam:    VS:  BP 130/66   Pulse 68   Ht 6\' 1"  (1.854 m)   Wt 213 lb 1.9 oz (96.7 kg)   SpO2 (!) 69%   BMI 28.12 kg/m      Wt Readings from Last 3 Encounters:  01/29/18 213 lb 1.9 oz (96.7 kg)  12/22/17 212 lb 12.8 oz (96.5 kg)  12/04/17 210 lb (95.3 kg)     GEN:  Well nourished, well developed in no acute distress HEENT: Normal NECK: No JVD; No carotid bruits LYMPHATICS: No lymphadenopathy CARDIAC: RRR, no murmurs, no rubs, no gallops RESPIRATORY:  Clear to auscultation without rales, wheezing or rhonchi  ABDOMEN: Soft, non-tender, non-distended MUSCULOSKELETAL:  No edema; No deformity  SKIN: Warm and dry LOWER EXTREMITIES: no swelling NEUROLOGIC:  Alert and oriented x 3 PSYCHIATRIC:  Normal affect   ASSESSMENT:    1. Left bundle branch block   2. Sinus bradycardia   3. Personal history of pulmonary embolism   4. Ascending aortic aneurysm (HCC)    PLAN:    In order of problems listed above:  1. Left bundle branch block: Ejection fraction 30 to 45%.  On ARB.  Will not put him on beta-blocker because of bradycardia and first-degree AV block. 2. History of PE.  Stable no evidence of pulmonary hypertension. 3. Ascending aortic aneurysm: Chem-7 will be done and then CT Angie of his chest to check on the size of the aneurysm.  We will see him back in 1 month or sooner if he has a problem   Medication Adjustments/Labs and Tests Ordered: Current medicines are reviewed at length with the patient today.  Concerns regarding medicines are outlined above.  No orders of the defined types were placed in this encounter.  Medication changes: No orders of the defined types were placed in this encounter.   Signed, Park Liter, MD, [facility] 01/29/2018 4:27 PM    [place]

## 2018-01-30 ENCOUNTER — Telehealth: Payer: Self-pay | Admitting: *Deleted

## 2018-01-30 LAB — BASIC METABOLIC PANEL
BUN/Creatinine Ratio: 16 (ref 10–24)
BUN: 19 mg/dL (ref 8–27)
CALCIUM: 9.7 mg/dL (ref 8.6–10.2)
CHLORIDE: 105 mmol/L (ref 96–106)
CO2: 22 mmol/L (ref 20–29)
Creatinine, Ser: 1.21 mg/dL (ref 0.76–1.27)
GFR calc Af Amer: 68 mL/min/{1.73_m2} (ref >59)
GFR, EST NON AFRICAN AMERICAN: 59 mL/min/{1.73_m2} — AB (ref >59)
GLUCOSE: 98 mg/dL (ref 65–99)
POTASSIUM: 4.8 mmol/L (ref 3.5–5.2)
Sodium: 142 mmol/L (ref 134–144)

## 2018-01-30 NOTE — Telephone Encounter (Signed)
Opened in error

## 2018-02-01 ENCOUNTER — Telehealth (HOSPITAL_COMMUNITY): Payer: Self-pay | Admitting: *Deleted

## 2018-02-01 NOTE — Telephone Encounter (Signed)
Left message on voicemail per DPR in reference to upcoming appointment scheduled on 02/06/18 with detailed instructions given per Myocardial Perfusion Study Information Sheet for the test. LM to arrive 15 minutes early, and that it is imperative to arrive on time for appointment to keep from having the test rescheduled. If you need to cancel or reschedule your appointment, please call the office within 24 hours of your appointment. Failure to do so may result in a cancellation of your appointment, and a $50 no show fee. Phone number given for call back for any questions. Samrat Hayward Jacqueline    

## 2018-02-06 ENCOUNTER — Telehealth (HOSPITAL_COMMUNITY): Payer: Self-pay | Admitting: *Deleted

## 2018-02-06 ENCOUNTER — Encounter (HOSPITAL_COMMUNITY): Payer: Medicare Other

## 2018-02-06 NOTE — Telephone Encounter (Signed)
Patient was a no show for nuclear appointment on 02/06/18. Called patient to reschedule appointment and reviewed instructions with patient.  Kirstie Peri, RN

## 2018-02-12 ENCOUNTER — Ambulatory Visit (HOSPITAL_COMMUNITY): Payer: Medicare Other | Attending: Cardiovascular Disease

## 2018-02-12 DIAGNOSIS — R9431 Abnormal electrocardiogram [ECG] [EKG]: Secondary | ICD-10-CM | POA: Diagnosis not present

## 2018-02-12 DIAGNOSIS — R079 Chest pain, unspecified: Secondary | ICD-10-CM | POA: Insufficient documentation

## 2018-02-12 DIAGNOSIS — I447 Left bundle-branch block, unspecified: Secondary | ICD-10-CM | POA: Diagnosis not present

## 2018-02-12 DIAGNOSIS — R9439 Abnormal result of other cardiovascular function study: Secondary | ICD-10-CM | POA: Diagnosis not present

## 2018-02-12 LAB — MYOCARDIAL PERFUSION IMAGING
CHL CUP NUCLEAR SDS: 1
CHL CUP NUCLEAR SRS: 14
CHL CUP RESTING HR STRESS: 54 {beats}/min
LHR: 0.31
LV dias vol: 147 mL (ref 62–150)
LV sys vol: 81 mL
NUC STRESS TID: 1.03
Peak HR: 78 {beats}/min
SSS: 15

## 2018-02-12 MED ORDER — REGADENOSON 0.4 MG/5ML IV SOLN
0.4000 mg | Freq: Once | INTRAVENOUS | Status: AC
  Administered 2018-02-12: 0.4 mg via INTRAVENOUS

## 2018-02-12 MED ORDER — TECHNETIUM TC 99M TETROFOSMIN IV KIT
32.0000 | PACK | Freq: Once | INTRAVENOUS | Status: AC | PRN
  Administered 2018-02-12: 32 via INTRAVENOUS
  Filled 2018-02-12: qty 32

## 2018-02-12 MED ORDER — TECHNETIUM TC 99M TETROFOSMIN IV KIT
10.9000 | PACK | Freq: Once | INTRAVENOUS | Status: AC | PRN
  Administered 2018-02-12: 10.9 via INTRAVENOUS
  Filled 2018-02-12: qty 11

## 2018-02-13 ENCOUNTER — Ambulatory Visit (INDEPENDENT_AMBULATORY_CARE_PROVIDER_SITE_OTHER)
Admission: RE | Admit: 2018-02-13 | Discharge: 2018-02-13 | Disposition: A | Discharge status: Home / Self Care | Payer: Medicare Other | Source: Ambulatory Visit | Attending: Cardiology | Admitting: Cardiology

## 2018-02-13 ENCOUNTER — Telehealth: Payer: Self-pay | Admitting: *Deleted

## 2018-02-13 DIAGNOSIS — I712 Thoracic aortic aneurysm, without rupture: Secondary | ICD-10-CM

## 2018-02-13 DIAGNOSIS — R911 Solitary pulmonary nodule: Secondary | ICD-10-CM

## 2018-02-13 MED ORDER — IOPAMIDOL (ISOVUE-370) INJECTION 76%
100.0000 mL | Freq: Once | INTRAVENOUS | Status: AC | PRN
  Administered 2018-02-13: 100 mL via INTRAVENOUS

## 2018-02-13 NOTE — Telephone Encounter (Signed)
Left message to return call regarding lexiscan results.

## 2018-02-14 NOTE — Telephone Encounter (Signed)
Patient informed of lexiscan results and CTA chest results. Patient advised of pulmonology referral per [name]. Informed patient that he would be contacted to schedule this appointment. Patient verbalized understanding. No further questions.

## 2018-02-14 NOTE — Addendum Note (Signed)
Addended by: Austin Miles on: 02/14/2018 08:56 AM   Modules accepted: Orders

## 2018-03-01 ENCOUNTER — Ambulatory Visit: Payer: Medicare Other | Admitting: Cardiology

## 2018-03-02 ENCOUNTER — Encounter: Payer: Self-pay | Admitting: Cardiology

## 2018-03-02 ENCOUNTER — Ambulatory Visit (INDEPENDENT_AMBULATORY_CARE_PROVIDER_SITE_OTHER): Payer: Medicare Other | Admitting: Cardiology

## 2018-03-02 VITALS — BP 140/80 | HR 58 | Ht 73.0 in | Wt 212.0 lb

## 2018-03-02 DIAGNOSIS — I447 Left bundle-branch block, unspecified: Secondary | ICD-10-CM

## 2018-03-02 DIAGNOSIS — Z86711 Personal history of pulmonary embolism: Secondary | ICD-10-CM

## 2018-03-02 DIAGNOSIS — I712 Thoracic aortic aneurysm, without rupture: Secondary | ICD-10-CM

## 2018-03-02 DIAGNOSIS — I7121 Aneurysm of the ascending aorta, without rupture: Secondary | ICD-10-CM

## 2018-03-02 DIAGNOSIS — N400 Enlarged prostate without lower urinary tract symptoms: Secondary | ICD-10-CM | POA: Diagnosis not present

## 2018-03-02 NOTE — Progress Notes (Signed)
Cardiology Office Note:    Date:  03/02/2018   ID:  Timothy Moran, DOB 1946-05-16, MRN [id number]  PCP:  Dineen Kid, MD  Cardiologist:  Jenne Campus, MD    Referring MD: Dineen Kid, MD   Chief Complaint  Patient presents with  . Follow-up  Doing well  History of Present Illness:    Timothy Moran is a 72 y.o. male with left bundle branch block.  He was sent to [place] for evaluation echo cardiac and was done showed ejection fraction 40 to 45%.  Stress test was done which showed no evidence of ischemia.  However, on echocardiogram he was identified to have enlargement of the aorta CT of his chest was done after that showed mild ascending aortic aneurysm measuring 4.1 cm.  Incidental finding was some nodule suspicious for cancer.  He is scheduled to have PET scan. Clinically he is doing well denies have any chest pain tightness squeezing pressure burning chest.  Work hard and have no difficulty doing it he is also planning to start exercise on the regular basis which I encouraged him to do.  Past Medical History:  Diagnosis Date  . Benign brain tumor ([place])   . Chronic kidney disease    Stage 3  . History of pulmonary embolus (PE)   . Hypertension     Past Surgical History:  Procedure Laterality Date  . APPENDECTOMY    . HERNIA REPAIR      Current Medications: Current Meds  Medication Sig  . aspirin EC 81 MG tablet Take 1 tablet (81 mg total) by mouth daily.  . B Complex-C (SUPER B COMPLEX PO) Take 1 tablet by mouth daily.  . cetirizine (ZYRTEC) 10 MG tablet Take 10 mg by mouth daily.  . Cholecalciferol (VITAMIN D3) 5000 units TABS Take 1 tablet by mouth daily.   . Magnesium 400 MG CAPS Take 400 mg by mouth daily.   Marland Kitchen oxymetazoline (AFRIN) 0.05 % nasal spray Place 1 spray into both nostrils daily as needed for congestion.  . Saw Palmetto, Serenoa repens, (SAW PALMETTO BERRY PO) Take 1 tablet by mouth daily.  . valsartan (DIOVAN) 160 MG tablet Take 1 tablet by mouth  daily.     Allergies:   Patient has no known allergies.   Social History   Socioeconomic History  . Marital status: Married    Spouse name: Not on file  . Number of children: Not on file  . Years of education: Not on file  . Highest education level: Not on file  Occupational History  . Not on file  Social Needs  . Financial resource strain: Not on file  . Food insecurity:    Worry: Not on file    Inability: Not on file  . Transportation needs:    Medical: Not on file    Non-medical: Not on file  Tobacco Use  . Smoking status: Never Smoker  . Smokeless tobacco: Never Used  Substance and Sexual Activity  . Alcohol use: Yes  . Drug use: Never  . Sexual activity: Not on file  Lifestyle  . Physical activity:    Days per week: Not on file    Minutes per session: Not on file  . Stress: Not on file  Relationships  . Social connections:    Talks on phone: Not on file    Gets together: Not on file    Attends religious service: Not on file    Active member of club or organization: Not on file  Attends meetings of clubs or organizations: Not on file    Relationship status: Not on file  Other Topics Concern  . Not on file  Social History Narrative  . Not on file     Family History: The patient's family history includes Brain cancer in his sister; Diabetes in his mother; Leukemia in his brother; Melanoma in his brother. ROS:   Please see the history of present illness.    All 14 point review of systems negative except as described per history of present illness  EKGs/Labs/Other Studies Reviewed:      Recent Labs: 12/05/2017: Hemoglobin 13.7; Platelets 245 01/29/2018: BUN 19; Creatinine, Ser 1.21; Potassium 4.8; Sodium 142  Recent Lipid Panel    Component Value Date/Time   CHOL 203 (H) 01/10/2018 0000   TRIG 236 (H) 01/10/2018 0000   HDL 40 01/10/2018 0000   CHOLHDL 5.1 (H) 01/10/2018 0000   LDLCALC 116 (H) 01/10/2018 0000    Physical Exam:    VS:  BP 140/80  (BP Location: Right Arm, Patient Position: Sitting, Cuff Size: Normal)   Pulse (!) 58   Ht 6\' 1"  (1.854 m)   Wt 212 lb (96.2 kg)   SpO2 98%   BMI 27.97 kg/m     Wt Readings from Last 3 Encounters:  03/02/18 212 lb (96.2 kg)  02/12/18 213 lb (96.6 kg)  01/29/18 213 lb 1.9 oz (96.7 kg)     GEN:  Well nourished, well developed in no acute distress HEENT: Normal NECK: No JVD; No carotid bruits LYMPHATICS: No lymphadenopathy CARDIAC: RRR, no murmurs, no rubs, no gallops RESPIRATORY:  Clear to auscultation without rales, wheezing or rhonchi  ABDOMEN: Soft, non-tender, non-distended MUSCULOSKELETAL:  No edema; No deformity  SKIN: Warm and dry LOWER EXTREMITIES: no swelling NEUROLOGIC:  Alert and oriented x 3 PSYCHIATRIC:  Normal affect   ASSESSMENT:    1. Left bundle branch block   2. Ascending aortic aneurysm (HCC)   3. Personal history of pulmonary embolism    PLAN:    In order of problems listed above:  1. Left bundle branch block: Ejection fraction 4045%.  We will get Chem-7 today and based on that we will decide if we can increase dose of Diovan.  He is not a candidate for beta-blocker because of bradycardia. 2. Stenting aortic aneurysm 4.1 cm will require yearly CT.  In the meantime will get his blood pressure under control. 3. History of pulmonary emboli.  Off anticoagulation feeling was that his PE was provoked.  He is to travel a lot spent a lot of time in the plane.  Now he does not do anymore.   Overall clinically he seems to be doing well concern obviously is a pulmonary nodule he will have a PET scan to clarify that.   Medication Adjustments/Labs and Tests Ordered: Current medicines are reviewed at length with the patient today.  Concerns regarding medicines are outlined above.  No orders of the defined types were placed in this encounter.  Medication changes: No orders of the defined types were placed in this encounter.   Signed, Park Liter, MD,  [facility] 03/02/2018 10:09 AM    [place]

## 2018-03-02 NOTE — Patient Instructions (Signed)
Medication Instructions:  Your physician recommends that you continue on your current medications as directed. Please refer to the Current Medication list given to you today.   Labwork: Your physician recommends that you return for lab work today: BMP.   Testing/Procedures: None  Follow-Up: Your physician wants you to follow-up in: 3 months. You will receive a reminder letter in the mail two months in advance. If you don't receive a letter, please call our office to schedule the follow-up appointment.   If you need a refill on your cardiac medications before your next appointment, please call your pharmacy.   Thank you for choosing [facility]! Catherine Lockhart, RN [phone]    

## 2018-03-03 LAB — BASIC METABOLIC PANEL
BUN/Creatinine Ratio: 14 (ref 10–24)
BUN: 18 mg/dL (ref 8–27)
CALCIUM: 9.6 mg/dL (ref 8.6–10.2)
CHLORIDE: 106 mmol/L (ref 96–106)
CO2: 23 mmol/L (ref 20–29)
Creatinine, Ser: 1.27 mg/dL (ref 0.76–1.27)
GFR calc Af Amer: 64 mL/min/{1.73_m2} (ref >59)
GFR calc non Af Amer: 55 mL/min/{1.73_m2} — ABNORMAL LOW (ref >59)
GLUCOSE: 113 mg/dL — AB (ref 65–99)
POTASSIUM: 4.9 mmol/L (ref 3.5–5.2)
Sodium: 145 mmol/L — ABNORMAL HIGH (ref 134–144)

## 2018-03-07 ENCOUNTER — Telehealth: Payer: Self-pay | Admitting: *Deleted

## 2018-03-07 DIAGNOSIS — I447 Left bundle-branch block, unspecified: Secondary | ICD-10-CM

## 2018-03-07 MED ORDER — VALSARTAN 320 MG PO TABS: 320.0000 mg | ORAL_TABLET | Freq: Every day | ORAL | 6 refills | Status: DC

## 2018-03-07 NOTE — Telephone Encounter (Signed)
Patient informed of results and advised to increase valsartan (diovan) from 160 mg to 320 mg daily. New prescription sent in to [facility] in [place] as requested. Patient informed to come into our office on Monday, 03/12/18 for follow up lab work. Patient verbalized understanding. No further questions.

## 2018-03-07 NOTE — Addendum Note (Signed)
Addended by: Austin Miles on: 03/07/2018 09:22 AM   Modules accepted: Orders

## 2018-03-07 NOTE — Telephone Encounter (Signed)
-----   Message from Park Liter, MD sent at 03/05/2018  8:28 AM EDT ----- Double Diovan to 320 mg every day, Chem-7 within next few days

## 2018-03-08 ENCOUNTER — Ambulatory Visit (INDEPENDENT_AMBULATORY_CARE_PROVIDER_SITE_OTHER): Payer: Medicare Other | Admitting: Internal Medicine

## 2018-03-08 ENCOUNTER — Encounter: Payer: Self-pay | Admitting: Internal Medicine

## 2018-03-08 VITALS — BP 112/66 | HR 54 | Ht 73.5 in | Wt 212.0 lb

## 2018-03-08 DIAGNOSIS — I2729 Other secondary pulmonary hypertension: Secondary | ICD-10-CM

## 2018-03-08 DIAGNOSIS — R911 Solitary pulmonary nodule: Secondary | ICD-10-CM | POA: Diagnosis not present

## 2018-03-08 DIAGNOSIS — I2699 Other pulmonary embolism without acute cor pulmonale: Secondary | ICD-10-CM

## 2018-03-08 HISTORY — DX: Solitary pulmonary nodule: R91.1

## 2018-03-08 NOTE — Assessment & Plan Note (Addendum)
CT 12/04/17 1.0 x 0.8 x 0.7 cm nodular opacity in the RUL vs not seen 03/16/10 posterior segment of the right upper lobe. Marland KitchenSpirometry 03/08/2018    FEV1 3.86 (108%)  Ratio 96 s prior rx  - PET ordered 03/08/2018   He is 10 years out from smoking cessation which places him at risk of lung ca which is the most likely dx here and PET scan is the next step though if neg due to relatively small size  would not get him completely cleared and still would need serial  f/u and consideration for early excisional bx (as a neg pet would also indicate no mets)   Discussed in detail all the  indications, usual  risks and alternatives  relative to the benefits with patient who agrees to proceed with w/u as outlined.     Total time devoted to counseling  > 50 % of initial 60 min office visit:  review case with pt/wife discussion of options/alternatives/ personally creating written customized instructions  in presence of pt  then going over those specific  Instructions directly with the pt including how to use all of the meds but in particular covering each new medication in detail and the difference between the maintenance= "automatic" meds and the prns using an action plan format for the latter (If this problem/symptom => do that organization reading Left to right).  Please see AVS from this visit for a full list of these instructions which I personally wrote for this pt and  are unique to this visit.

## 2018-03-08 NOTE — Progress Notes (Signed)
Timothy Moran, male    DOB: [birth year],   MRN: [id number]   Brief patient profile:  74 yowm  Quit smoking s/p PE in 2009 then again in 2011 eval at [facility] rec lifelong anticoag but after [place] hematologist did neg hemophilia w/u rec ok to stop as long as avoided freq travel and had CTa 12/04/17  due to transient non pleuritic cp showing RUL nodule  Which had not changed as of 02/14/18 so referred to pulmonary clinic 03/08/2018 by Dr   Agustin Cree   03/08/2018  ov/Timothy Moran re:  consultatoin re SPN  Chief Complaint  Patient presents with  . Pulmonary Consult    Referred by [name] for eval of incidental pulmonary nodule.   He denies any respiratory co's.   Dyspnea:  Not limited by breathing from desired activities  / very active no problem with yardwork including with pushing mower  Cough: none  SABA use: none    No obvious day to day or daytime variability or assoc excess/ purulent sputum or mucus plugs or hemoptysis or cp or chest tightness, subjective wheeze or overt sinus or hb symptoms.   Sleep: lie flat ok without nocturnal  or early am exacerbation  of respiratory  c/o's or need for noct saba. Also denies any obvious fluctuation of symptoms with weather or environmental changes or other aggravating or alleviating factors except as outlined above   No unusual exposure hx or h/o childhood pna/ asthma or knowledge of premature birth.  Current Allergies, Complete Past Medical History, Past Surgical History, Family History, and Social History were reviewed in [employer] record.  ROS  The following are not active complaints unless bolded Hoarseness, sore throat, dysphagia, dental problems, itching, sneezing,  nasal congestion or discharge of excess mucus or purulent secretions, ear ache,   fever, chills, sweats, unintended wt loss or wt gain, classically pleuritic or exertional cp,  orthopnea pnd or arm/hand swelling  or leg swelling, presyncope, palpitations,  abdominal pain, anorexia, nausea, vomiting, diarrhea  or change in bowel habits or change in bladder habits, change in stools or change in urine, dysuria, hematuria,  rash, arthralgias, visual complaints, headache, numbness, weakness or ataxia or problems with walking or coordination,  change in mood or  memory.            Past Medical History:  Diagnosis Date  . Benign brain tumor ([place])   . Chronic kidney disease    Stage 3  . History of pulmonary embolus (PE)   . Hypertension     Outpatient Medications Prior to Visit  Medication Sig Dispense Refill  . aspirin EC 81 MG tablet Take 1 tablet (81 mg total) by mouth daily. 90 tablet 3  . B Complex-C (SUPER B COMPLEX PO) Take 1 tablet by mouth daily.    . cetirizine (ZYRTEC) 10 MG tablet Take 10 mg by mouth daily.    . Cholecalciferol (VITAMIN D3) 5000 units TABS Take 1 tablet by mouth daily.     . Magnesium 400 MG CAPS Take 400 mg by mouth daily.     Marland Kitchen oxymetazoline (AFRIN) 0.05 % nasal spray Place 1 spray into both nostrils daily as needed for congestion.    . Saw Palmetto, Serenoa repens, (SAW PALMETTO BERRY PO) Take 1 tablet by mouth daily.    . valsartan (DIOVAN) 320 MG tablet Take 1 tablet (320 mg total) by mouth daily. 30 tablet 6   No facility-administered medications prior to visit.  Objective:     BP 112/66 (BP Location: Left Arm, Cuff Size: Normal)   Pulse (!) 54   Ht 6' 1.5" (1.867 m)   Wt 212 lb (96.2 kg)   SpO2 94%   BMI 27.59 kg/m   SpO2: 94 %  RA   Wt Readings from Last 3 Encounters:  03/08/18 212 lb (96.2 kg)  03/02/18 212 lb (96.2 kg)  02/12/18 213 lb (96.6 kg)      HEENT: nl dentition, turbinates bilaterally, and oropharynx. Nl external ear canals without cough reflex   NECK :  without JVD/Nodes/TM/ nl carotid upstrokes bilaterally   LUNGS: no acc muscle use,  Nl contour chest which is clear to A and P bilaterally without cough on insp or exp maneuvers   CV:  RRR  no s3 or murmur or  increase in P2, and no edema   ABD:  soft and nontender with nl inspiratory excursion in the supine position. No bruits or organomegaly appreciated, bowel sounds nl  MS:  Nl gait/ ext warm without deformities, calf tenderness, cyanosis or clubbing No obvious joint restrictions   SKIN: warm and dry without lesions    NEURO:  alert, approp, nl sensorium with  no motor or cerebellar deficits apparent.     I personally reviewed images and agree with radiology impression as follows:   Chest CTa 02/13/18 1. Ascending aortic aneurysm, stable  2. Right upper lobe nodule persists and is worrisome for primary bronchogenic carcinoma. Other lesion size is borderline for PET resolution, PET is recommended to guide further workup.   3. Oblong smoothly marginated nodule along the right hemidiaphragm. A subpleural lymph node could have this appearance. Continued attention on follow-up exams is warranted. 4. Enlarged pulmonary arteries, indicative of pulmonary arterial hypertension. 5. Aortic atherosclerosis Coronary artery calcification.      Assessment   Solitary pulmonary nodule on lung CT CT 12/04/17 1.0 x 0.8 x 0.7 cm nodular opacity in the RUL vs not seen 03/16/10 posterior segment of the right upper lobe. Marland KitchenSpirometry 03/08/2018    FEV1 3.86 (108%)  Ratio 96 s prior rx  - PET ordered 03/08/2018   He is 10 years out from smoking cessation which places him at risk of lung ca which is the most likely dx here and PET scan is the next step though if neg due to relatively small size  would not get him completely cleared and still would need serial  f/u and consideration for early excisional bx (as a neg pet would also indicate no mets)   Discussed in detail all the  indications, usual  risks and alternatives  relative to the benefits with patient who agrees to proceed with w/u as outlined.     Total time devoted to counseling  > 50 % of initial 60 min office visit:  review case with pt/wife  discussion of options/alternatives/ personally creating written customized instructions  in presence of pt  then going over those specific  Instructions directly with the pt including how to use all of the meds but in particular covering each new medication in detail and the difference between the maintenance= "automatic" meds and the prns using an action plan format for the latter (If this problem/symptom => do that organization reading Left to right).  Please see AVS from this visit for a full list of these instructions which I personally wrote for this pt and  are unique to this visit.   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 Marland KitchenSpirometry 03/08/2018    FEV1 3.86 (108%)  Ratio 96 s prior rx  - PET 03/08/2018 >>>    I suspect he did have [place] related to recurrent PE's but with echo now nl and exercise tolerance excellent he does not need further w/u but would be at risk for post op dvt/ PE which would need to be addressed.     Christinia Gully, MD 03/08/2018

## 2018-03-08 NOTE — Patient Instructions (Addendum)
Please see patient coordinator before you leave today  to schedule PET scan and I will call you with results and arrange appropriate follow up then

## 2018-03-09 ENCOUNTER — Encounter: Payer: Self-pay | Admitting: Internal Medicine

## 2018-03-09 DIAGNOSIS — I2729 Other secondary pulmonary hypertension: Secondary | ICD-10-CM

## 2018-03-09 DIAGNOSIS — I2699 Other pulmonary embolism without acute cor pulmonale: Secondary | ICD-10-CM | POA: Insufficient documentation

## 2018-03-09 HISTORY — DX: Other pulmonary embolism without acute cor pulmonale: I26.99

## 2018-03-09 HISTORY — DX: Other secondary pulmonary hypertension: I27.29

## 2018-03-09 NOTE — Assessment & Plan Note (Signed)
CT 12/04/17 1.0 x 0.8 x 0.7 cm nodular opacity in the RUL vs not seen 03/16/10 posterior segment of the right upper lobe. Marland KitchenSpirometry 03/08/2018    FEV1 3.86 (108%)  Ratio 96 s prior rx  - PET 03/08/2018 >>>    I suspect he did have [place] related to recurrent PE's but with echo now nl and exercise tolerance excellent he does not need further w/u but would be at risk for post op dvt/ PE which would need to be addressed.

## 2018-03-13 ENCOUNTER — Ambulatory Visit (HOSPITAL_COMMUNITY)
Admission: RE | Admit: 2018-03-13 | Discharge: 2018-03-13 | Disposition: A | Discharge status: Home / Self Care | Payer: Medicare Other | Source: Ambulatory Visit | Attending: Internal Medicine | Admitting: Internal Medicine

## 2018-03-13 ENCOUNTER — Telehealth: Payer: Self-pay | Admitting: *Deleted

## 2018-03-13 DIAGNOSIS — I447 Left bundle-branch block, unspecified: Secondary | ICD-10-CM | POA: Diagnosis not present

## 2018-03-13 DIAGNOSIS — R911 Solitary pulmonary nodule: Secondary | ICD-10-CM | POA: Insufficient documentation

## 2018-03-13 DIAGNOSIS — R918 Other nonspecific abnormal finding of lung field: Secondary | ICD-10-CM | POA: Diagnosis not present

## 2018-03-13 DIAGNOSIS — I712 Thoracic aortic aneurysm, without rupture: Secondary | ICD-10-CM | POA: Diagnosis not present

## 2018-03-13 LAB — GLUCOSE, CAPILLARY: GLUCOSE-CAPILLARY: 105 mg/dL — AB (ref 70–99)

## 2018-03-13 MED ORDER — FLUDEOXYGLUCOSE F - 18 (FDG) INJECTION
10.3000 | Freq: Once | INTRAVENOUS | Status: AC | PRN
  Administered 2018-03-13: 10.3 via INTRAVENOUS

## 2018-03-13 NOTE — Telephone Encounter (Signed)
Patient informed to come to our office for follow up lab work today or tomorrow. Patient advised he does not have to fast beforehand as we are checking a BMP. Patient verbalized understanding. No further questions.

## 2018-03-13 NOTE — Telephone Encounter (Signed)
Pt was supposed to get lab work 03/12/18 after increasing Valsartan but was unable to make it. Would like to get it done today but has been injected with dye for cat scan should he wait until tomorrow for labs, also should he be fasting. Please advise

## 2018-03-14 ENCOUNTER — Other Ambulatory Visit: Payer: Self-pay | Admitting: *Deleted

## 2018-03-14 DIAGNOSIS — R911 Solitary pulmonary nodule: Secondary | ICD-10-CM

## 2018-03-14 LAB — BASIC METABOLIC PANEL
BUN / CREAT RATIO: 16 (ref 10–24)
BUN: 20 mg/dL (ref 8–27)
CO2: 22 mmol/L (ref 20–29)
CREATININE: 1.27 mg/dL (ref 0.76–1.27)
Calcium: 9.3 mg/dL (ref 8.6–10.2)
Chloride: 102 mmol/L (ref 96–106)
GFR calc Af Amer: 64 mL/min/{1.73_m2} (ref >59)
GFR calc non Af Amer: 55 mL/min/{1.73_m2} — ABNORMAL LOW (ref >59)
Glucose: 95 mg/dL (ref 65–99)
Potassium: 4.6 mmol/L (ref 3.5–5.2)
Sodium: 140 mmol/L (ref 134–144)

## 2018-03-23 ENCOUNTER — Telehealth: Payer: Self-pay | Admitting: Internal Medicine

## 2018-03-23 NOTE — Telephone Encounter (Signed)
Called and spoke with pt regarding scheduling a PET scan; Pt verbalized this has already been handled; he had no further concerns. Nothing further needed.

## 2018-03-23 NOTE — Telephone Encounter (Signed)
PET Scan was done on 03/13/2018 maybe he wants the results

## 2018-03-28 ENCOUNTER — Encounter: Payer: Self-pay | Admitting: Thoracic Surgery (Cardiothoracic Vascular Surgery)

## 2018-03-28 ENCOUNTER — Institutional Professional Consult (permissible substitution) (INDEPENDENT_AMBULATORY_CARE_PROVIDER_SITE_OTHER): Payer: Medicare Other | Admitting: Thoracic Surgery (Cardiothoracic Vascular Surgery)

## 2018-03-28 ENCOUNTER — Other Ambulatory Visit: Payer: Self-pay

## 2018-03-28 VITALS — BP 130/77 | HR 62 | Resp 16 | Ht 73.5 in | Wt 212.0 lb

## 2018-03-28 DIAGNOSIS — I712 Thoracic aortic aneurysm, without rupture, unspecified: Secondary | ICD-10-CM

## 2018-03-28 DIAGNOSIS — D381 Neoplasm of uncertain behavior of trachea, bronchus and lung: Secondary | ICD-10-CM | POA: Diagnosis not present

## 2018-03-28 NOTE — Progress Notes (Signed)
PCP is Via, Timothy Bihari, MD Referring Provider is Timothy Rockers, MD  Chief Complaint  Patient presents with  . Lung Lesion    RULobe per CTA CHEST 02/13/18, PET 03/13/18...ECHO 12/27/17 detected TAA which led to CT CHEST...SPIROMETRY IN OFFICE ONLY    HPI: [name] is sent for consultation regarding a right upper lobe lung nodule.  Timothy Moran is a 72 year old retired gentleman with a past history of pulmonary embolus, possible pulmonary hypertension, hypertension, stage III chronic kidney disease, a benign brain tumor, and a left bundle branch block.  In March of this year he was experiencing chest pain.  That turned out to be reflux.  As part of his work-up he had a CT angiogram.  There is no evidence of pulmonary embolism.  There was a 10 x 8 x 7 mm opacity in the posterior segment of the right upper lobe.  There was a larger opacity in the right lower lobe along the diaphragm.  A follow-up CT measured the nodule at 5 x 12 mm but was essentially unchanged.  The lower lobe nodule measured 9 x 21 mm.  He was referred to Dr. Melvyn Moran.  A PET/CT was done.  It showed mild uptake in the right upper lobe nodule with an SUV of 1.7.  There is no uptake in the right lower lobe nodule.  There was faint uptake below the level of mediastinal blood pool and a right paratracheal lymph node.  He had an EKG done as well.  It showed a left bundle branch block.  An echocardiogram in April showed an ejection fraction of 40 to 45%.  There was dyssynergy of the septum.  Right ventricular function was normal and there was no evidence of pulmonary hypertension.  He smoked for while in college in early 68s.  He quit 50 years ago.  He does not have any shortness of breath with exertion and can walk up 2 flights of stairs easily.  He had the episode of reflux back in March but has not had any chest pain, pressure, or tightness since then.  His appetite is good.  He denies weight loss.  He does have some occasional dizziness  particular when he is up on a ladder. Zubrod Score: At the time of surgery this patient's most appropriate activity status/level should be described as: [x]     0    Normal activity, no symptoms []     1    Restricted in physical strenuous activity but ambulatory, able to do out light work []     2    Ambulatory and capable of self care, unable to do work activities, up and about >50 % of waking hours                              []     3    Only limited self care, in bed greater than 50% of waking hours []     4    Completely disabled, no self care, confined to bed or chair []     5    Moribund  Past Medical History:  Diagnosis Date  . Benign brain tumor ([place])   . Chronic kidney disease    Stage 3  . History of pulmonary embolus (PE)   . Hypertension     Past Surgical History:  Procedure Laterality Date  . APPENDECTOMY    . HERNIA REPAIR      Family History  Problem  Relation Age of Onset  . Diabetes Mother   . Brain cancer Sister   . Melanoma Brother        Radiation Exposure  . Leukemia Brother        Agent Orange    Social History Social History   Tobacco Use  . Smoking status: Former Smoker    Packs/day: 0.50    Years: 5.00    Pack years: 2.50    Types: Cigarettes    Last attempt to quit: 09/12/1968    Years since quitting: 49.5  . Smokeless tobacco: Never Used  Substance Use Topics  . Alcohol use: Yes    Comment: ONE PER WEEK  . Drug use: Never    Current Outpatient Medications  Medication Sig Dispense Refill  . aspirin EC 81 MG tablet Take 1 tablet (81 mg total) by mouth daily. 90 tablet 3  . B Complex-C (SUPER B COMPLEX PO) Take 1 tablet by mouth daily.    . cetirizine (ZYRTEC) 10 MG tablet Take 10 mg by mouth daily.    . Cholecalciferol (VITAMIN D3) 5000 units TABS Take 1 tablet by mouth daily.     . Magnesium 400 MG CAPS Take 400 mg by mouth daily.     Marland Kitchen oxymetazoline (AFRIN) 0.05 % nasal spray Place 1 spray into both nostrils daily as needed for  congestion.    . Saw Palmetto, Serenoa repens, (SAW PALMETTO BERRY PO) Take 1 tablet by mouth daily.    . valsartan (DIOVAN) 320 MG tablet Take 1 tablet (320 mg total) by mouth daily. 30 tablet 6   No current facility-administered medications for this visit.     No Known Allergies  Review of Systems  Constitutional: Negative for activity change, appetite change and unexpected weight change.  HENT: Negative for trouble swallowing.   Eyes: Negative for visual disturbance.  Respiratory: Negative for chest tightness, shortness of breath and wheezing.   Cardiovascular: Positive for chest pain (March 2019). Negative for palpitations and leg swelling.  Gastrointestinal: Positive for abdominal pain. Negative for abdominal distention and blood in stool.  Endocrine: Negative for polydipsia and polyphagia.  Genitourinary: Positive for difficulty urinating (BPH). Negative for dysuria.  Musculoskeletal: Negative for arthralgias and myalgias.  Neurological: Positive for dizziness. Negative for seizures and syncope.  Hematological: Negative for adenopathy. Does not bruise/bleed easily.  All other systems reviewed and are negative.   BP 130/77 (BP Location: Right Arm, Patient Position: Sitting, Cuff Size: Large)   Pulse 62   Resp 16   Ht 6' 1.5" (1.867 m)   Wt 212 lb (96.2 kg)   SpO2 94% Comment: ON RA  BMI 27.59 kg/m  Physical Exam  Constitutional: He is oriented to person, place, and time. He appears well-developed and well-nourished. No distress.  HENT:  Head: Normocephalic and atraumatic.  Mouth/Throat: No oropharyngeal exudate.  Eyes: Conjunctivae and EOM are normal. No scleral icterus.  Neck: Neck supple. No thyromegaly present.  Cardiovascular: Normal rate, regular rhythm and normal heart sounds. Exam reveals no gallop and no friction rub.  No murmur heard. Pulmonary/Chest: Effort normal and breath sounds normal. No respiratory distress. He has no wheezes. He has no rales.   Abdominal: Soft. He exhibits no distension. There is no tenderness.  Musculoskeletal: He exhibits no edema or deformity.  Lymphadenopathy:    He has no cervical adenopathy.  Neurological: He is alert and oriented to person, place, and time. No cranial nerve deficit. He exhibits normal muscle tone. Coordination normal.  Skin:  Skin is warm and dry.  Vitals reviewed.    Diagnostic Tests: CT ANGIOGRAPHY CHEST WITH CONTRAST  TECHNIQUE: Multidetector CT imaging of the chest was performed using the standard protocol during bolus administration of intravenous contrast. Multiplanar CT image reconstructions and MIPs were obtained to evaluate the vascular anatomy.  CONTRAST:  177mL ISOVUE-370 IOPAMIDOL (ISOVUE-370) INJECTION 76%  COMPARISON:  12/04/2017.  FINDINGS: Cardiovascular: Atherosclerotic calcification of the arterial vasculature, including three-vessel involvement of the coronary arteries. Ascending aorta measures 4.1 cm, as before. Pulmonary arteries are enlarged. Heart is at the upper limits of normal in size. No pericardial effusion.  Mediastinum/Nodes: Mediastinal lymph nodes measure up to 10 mm in the low right paratracheal station, as before. No hilar or axillary adenopathy. Esophagus is grossly unremarkable.  Lungs/Pleura: A 5 x 12 mm nodule in the right upper lobe (series 5, image 30) is unchanged from 12/04/2017. Oblong subpleural nodule along the right hemidiaphragm measures 9 x 21 mm, also unchanged. Lungs are otherwise clear. No pleural fluid. Airway is unremarkable.  Upper Abdomen: Visualized portions of the liver, gallbladder, adrenal glands, kidneys, spleen, pancreas, stomach and bowel are grossly unremarkable.  Musculoskeletal: Degenerative changes in the spine. No worrisome lytic or sclerotic lesions.  Review of the MIP images confirms the above findings.  IMPRESSION: 1. Ascending aortic aneurysm, stable. Recommend annual imaging followup  by CTA or MRA. This recommendation follows 2010 ACCF/AHA/AATS/ACR/ASA/SCA/SCAI/SIR/STS/SVM Guidelines for the Diagnosis and Management of Patients with Thoracic Aortic Disease. Circulation. 2010; 121: T625-W389. 2. Right upper lobe nodule persists and is worrisome for primary bronchogenic carcinoma. Other lesion size is borderline for PET resolution, PET is recommended to guide further workup. These results will be called to the ordering clinician or representative by the Radiologist Assistant, and communication documented in the PACS or zVision Dashboard. 3. Oblong smoothly marginated nodule along the right hemidiaphragm. A subpleural lymph node could have this appearance. Continued attention on follow-up exams is warranted. 4. Enlarged pulmonary arteries, indicative of pulmonary arterial hypertension. 5. Aortic atherosclerosis (ICD10-170.0). Coronary artery calcification.   Electronically Signed   By: Lorin Picket M.D.   On: 02/14/2018 07:07 NUCLEAR MEDICINE PET SKULL BASE TO THIGH  TECHNIQUE: 10.3 mCi F-18 FDG was injected intravenously. Full-ring PET imaging was performed from the skull base to thigh after the radiotracer. CT data was obtained and used for attenuation correction and anatomic localization.  Fasting blood glucose: 105 mg/dl  COMPARISON:  Chest CT on 02/13/2018  FINDINGS: (Reference/background mediastinal blood pool activity: SUV max = 2.7)  NECK:  No hypermetabolic lymph nodes or masses.  Incidental CT findings:  None.  CHEST: 12 mm ill-defined pulmonary nodule in the posterior right upper lobe shows mild FDG uptake with SUV max of 1.7. Oblong pulmonary nodule in the right lower lobe abutting the diaphragm on image 45/8 measures 2.0 x 0.8 cm, which is stable in size and shows no FDG uptake.  A 9 mm right paratracheal lymph node is seen with SUV max 2.6, which is slightly below mediastinal blood pool. No hypermetabolic lymph nodes  identified within the thorax.  Incidental CT findings: Stable aneurysm of ascending aorta measuring 4.1 cm. Aortic and coronary artery atherosclerosis.  ABDOMEN/PELVIS: No abnormal hypermetabolic activity within the liver, pancreas, adrenal glands, or spleen. No hypermetabolic lymph nodes in the abdomen or pelvis.  Incidental CT findings: Aortic atherosclerosis. Surgical mesh seen within the right lower quadrant anterior abdominal wall and inguinal region.  SKELETON: No focal hypermetabolic bone lesions to suggest skeletal metastasis.  Incidental CT  findings:  None.  IMPRESSION: 12 mm posterior right upper lobe pulmonary nodule shows mild FDG uptake, suspicious for low-grade adenocarcinoma.  9 mm right paratracheal lymph node shows minimal FDG uptake which is less than mediastinal blood pool. No hypermetabolic thoracic lymph nodes or evidence of distant metastatic disease.  2 cm right lower lobe nodule shows no FDG uptake, suggesting benign etiology such as a intrapulmonary lymph node. Recommend continued attention on follow-up imaging.  Stable 4.1 cm ascending thoracic aortic aneurysm. Recommend annual imaging followup by CTA or MRA. This recommendation follows 2010 ACCF/AHA/AATS/ACR/ASA/SCA/SCAI/SIR/STS/SVM Guidelines for the Diagnosis and Management of Patients with Thoracic Aortic Disease. Circulation. 2010; 121: C789-F810   Electronically Signed   By: Earle Gell M.D.   On: 03/13/2018 10:07 I personally reviewed the CT and PET/CT images and concur with the findings noted above.  Impression: [name] is a 72 year old non-smoker with a past history of pulmonary embolus, benign brain tumor, left bundle branch block, hypertension, and stage III chronic kidney disease.  He was found to have a 5 x 12 mm right upper lobe lung nodule on a CT was done to evaluate chest pain back in March.  There was a second 9 x 21 mm nodule in the right lower lobe.  On PET the  right upper lobe nodule has minimal uptake with an SUV of 1.7.  We do not have the films from his CT scan in 2011 where he was diagnosed with pulmonary emboli, but by report there were no parenchymal lesions present at that time.  Differential diagnosis includes primary bronchogenic carcinoma as well as infectious and inflammatory nodules.  I had a long discussion with Mr. Mrs. Snyder.  I reviewed the films with them.  We discussed 3 potential options.  First would be continued radiographic follow-up.  Second would be bronchoscopic biopsy.  And finally surgical resection.  Given the central location of the lesion he would need a lobectomy I do not think the lesion is amenable to a wedge resection.  We discussed the relative advantages and disadvantages of each of those approaches.  I did discuss the possibility of right VATS and right upper lobectomy with Schneiders.  I informed them of the general nature of the operation, the incisions to be used, the need for general anesthesia, use of a drainage tube postoperatively, the expected hospital stay, and the overall recovery.  He understands this would be amenable to a minimally invasive approach.  I reviewed the indications, risk, benefits, and alternatives.  They understand the risks include, but not limited to death, MI, DVT, PE, bleeding, possible need for transfusion, stroke, infection, prolonged air leak, cardiac arrhythmias, as well as possibility of other procedural complications.  They have a trip to [place] planned in late September.  He does not want to have any intervention done before that time.  He understands that there is a small possibility of progression at this is a carcinoma.  He is willing to accept that risk and wants to wait until after he returns from [place] before doing anything.  Given that that will be 3 months since his last scan I think he will need another CT of the chest, so we will do that when he gets back and I will see him back  in the office and we will discuss how to proceed.  Plan: Return in 3 months with CT chest  Melrose Nakayama, MD [facility] [phone]

## 2018-04-04 DIAGNOSIS — N130 Hydronephrosis with ureteropelvic junction obstruction: Secondary | ICD-10-CM | POA: Diagnosis not present

## 2018-04-04 DIAGNOSIS — R3914 Feeling of incomplete bladder emptying: Secondary | ICD-10-CM | POA: Diagnosis not present

## 2018-04-04 DIAGNOSIS — R3912 Poor urinary stream: Secondary | ICD-10-CM | POA: Diagnosis not present

## 2018-04-04 DIAGNOSIS — R350 Frequency of micturition: Secondary | ICD-10-CM | POA: Diagnosis not present

## 2018-04-05 DIAGNOSIS — S86811A Strain of other muscle(s) and tendon(s) at lower leg level, right leg, initial encounter: Secondary | ICD-10-CM | POA: Diagnosis not present

## 2018-05-03 DIAGNOSIS — N130 Hydronephrosis with ureteropelvic junction obstruction: Secondary | ICD-10-CM | POA: Diagnosis not present

## 2018-05-03 DIAGNOSIS — R3914 Feeling of incomplete bladder emptying: Secondary | ICD-10-CM | POA: Diagnosis not present

## 2018-05-03 DIAGNOSIS — R3912 Poor urinary stream: Secondary | ICD-10-CM | POA: Diagnosis not present

## 2018-05-23 ENCOUNTER — Other Ambulatory Visit: Payer: Self-pay | Admitting: Thoracic Surgery (Cardiothoracic Vascular Surgery)

## 2018-05-23 DIAGNOSIS — R918 Other nonspecific abnormal finding of lung field: Secondary | ICD-10-CM

## 2018-07-02 ENCOUNTER — Encounter: Payer: Self-pay | Admitting: Cardiology

## 2018-07-02 ENCOUNTER — Ambulatory Visit (INDEPENDENT_AMBULATORY_CARE_PROVIDER_SITE_OTHER): Payer: Medicare Other | Admitting: Cardiology

## 2018-07-02 VITALS — BP 138/78 | HR 60 | Ht 73.0 in | Wt 219.8 lb

## 2018-07-02 DIAGNOSIS — R911 Solitary pulmonary nodule: Secondary | ICD-10-CM | POA: Diagnosis not present

## 2018-07-02 DIAGNOSIS — I712 Thoracic aortic aneurysm, without rupture: Secondary | ICD-10-CM | POA: Diagnosis not present

## 2018-07-02 DIAGNOSIS — I7121 Aneurysm of the ascending aorta, without rupture: Secondary | ICD-10-CM

## 2018-07-02 DIAGNOSIS — Z86711 Personal history of pulmonary embolism: Secondary | ICD-10-CM | POA: Diagnosis not present

## 2018-07-02 DIAGNOSIS — I447 Left bundle-branch block, unspecified: Secondary | ICD-10-CM | POA: Diagnosis not present

## 2018-07-02 NOTE — Progress Notes (Signed)
Cardiology Office Note:    Date:  07/02/2018   ID:  Timothy Moran, DOB [birth year], MRN [id number]  PCP:  Dineen Kid, MD  Cardiologist:  Jenne Campus, MD    Referring MD: Dineen Kid, MD   No chief complaint on file. Doing well  History of Present Illness:    Timothy Moran is a 72 y.o. male who was originally referred to me because of left bundle branch block.  Echocardiogram was done showed ejection fraction 45% which is most likely related to bundle.  Stress test was done which showed no evidence of ischemia as a part of work-up echocardiogram was done which show aneurysm then CT of his chest was done which showed some nodule PET scan was done which showed some activity of the nodule and then he seen cardiothoracic surgery with consideration of potentially lobectomy.  He did not want to do it right away he preferred to go to [place] for vacation now he is back is scheduled to have repeated CT of his chest tomorrow.  And the decision about future therapy will be made based on results of those tests.  Cardiac wise doing well no chest pain tightness squeezing pressure burning chest.  No swelling of lower extremities no dizziness no passing out  Past Medical History:  Diagnosis Date  . Benign brain tumor ([place] [facility])   . Chronic kidney disease    Stage 3  . History of pulmonary embolus (PE)   . Hypertension     Past Surgical History:  Procedure Laterality Date  . APPENDECTOMY    . HERNIA REPAIR      Current Medications: Current Meds  Medication Sig  . aspirin EC 81 MG tablet Take 1 tablet (81 mg total) by mouth daily.  . B Complex-C (SUPER B COMPLEX PO) Take 1 tablet by mouth daily.  . cetirizine (ZYRTEC) 10 MG tablet Take 10 mg by mouth daily.  . Cholecalciferol (VITAMIN D3) 5000 units TABS Take 1 tablet by mouth daily.   . Magnesium 400 MG CAPS Take 400 mg by mouth daily.   Marland Kitchen oxymetazoline (AFRIN) 0.05 % nasal spray Place 1 spray into both nostrils daily as  needed for congestion.  . tamsulosin (FLOMAX) 0.4 MG CAPS capsule Take 2 capsules by mouth daily.  . valsartan (DIOVAN) 320 MG tablet Take 1 tablet (320 mg total) by mouth daily.     Allergies:   Patient has no known allergies.   Social History   Socioeconomic History  . Marital status: Married    Spouse name: Not on file  . Number of children: Not on file  . Years of education: Not on file  . Highest education level: Not on file  Occupational History  . Not on file  Social Needs  . Financial resource strain: Not on file  . Food insecurity:    Worry: Not on file    Inability: Not on file  . Transportation needs:    Medical: Not on file    Non-medical: Not on file  Tobacco Use  . Smoking status: Former Smoker    Packs/day: 0.50    Years: 5.00    Pack years: 2.50    Types: Cigarettes    Last attempt to quit: 09/12/1968    Years since quitting: 49.8  . Smokeless tobacco: Never Used  Substance and Sexual Activity  . Alcohol use: Yes    Comment: ONE PER WEEK  . Drug use: Never  . Sexual activity: Not on file  Lifestyle  . Physical activity:    Days per week: Not on file    Minutes per session: Not on file  . Stress: Not on file  Relationships  . Social connections:    Talks on phone: Not on file    Gets together: Not on file    Attends religious service: Not on file    Active member of club or organization: Not on file    Attends meetings of clubs or organizations: Not on file    Relationship status: Not on file  Other Topics Concern  . Not on file  Social History Narrative  . Not on file     Family History: The patient's family history includes Brain cancer in his sister; Diabetes in his mother; Leukemia in his brother; Melanoma in his brother. ROS:   Please see the history of present illness.    All 14 point review of systems negative except as described per history of present illness  EKGs/Labs/Other Studies Reviewed:      Recent Labs: 12/05/2017:  Hemoglobin 13.7; Platelets 245 03/13/2018: BUN 20; Creatinine, Ser 1.27; Potassium 4.6; Sodium 140  Recent Lipid Panel    Component Value Date/Time   CHOL 203 (H) 01/10/2018 0000   TRIG 236 (H) 01/10/2018 0000   HDL 40 01/10/2018 0000   CHOLHDL 5.1 (H) 01/10/2018 0000   LDLCALC 116 (H) 01/10/2018 0000    Physical Exam:    VS:  BP 138/78   Pulse 60   Ht 6\' 1"  (1.854 m)   Wt 219 lb 12.8 oz (99.7 kg)   SpO2 96%   BMI 29.00 kg/m     Wt Readings from Last 3 Encounters:  07/02/18 219 lb 12.8 oz (99.7 kg)  03/28/18 212 lb (96.2 kg)  03/08/18 212 lb (96.2 kg)     GEN:  Well nourished, well developed in no acute distress HEENT: Normal NECK: No JVD; No carotid bruits LYMPHATICS: No lymphadenopathy CARDIAC: RRR, no murmurs, no rubs, no gallops RESPIRATORY:  Clear to auscultation without rales, wheezing or rhonchi  ABDOMEN: Soft, non-tender, non-distended MUSCULOSKELETAL:  No edema; No deformity  SKIN: Warm and dry LOWER EXTREMITIES: no swelling NEUROLOGIC:  Alert and oriented x 3 PSYCHIATRIC:  Normal affect   ASSESSMENT:    No diagnosis found. PLAN:    In order of problems listed above:  1. Left bundle branch block.  I will schedule him to have an echocardiogram next April.  Last ejection fraction 45% on appropriate medications which I will continue not on beta-blocker because of bradycardia. 2. Ascending aortic aneurysm.  Will repeat CT year after last test has been taken.  Echo will concentrate on controlling his blood pressure. 3. Pulmonary nodule seen by CT surgeons.  He is scheduled to have CAT scan of his chest tomorrow to reevaluate the lesion and make a decision about management. 4. Enlargement of the pulmonary artery on the CT however echocardiogram did not show any evidence of pulmonary hypertension.   Medication Adjustments/Labs and Tests Ordered: Current medicines are reviewed at length with the patient today.  Concerns regarding medicines are outlined above.    No orders of the defined types were placed in this encounter.  Medication changes: No orders of the defined types were placed in this encounter.   Signed, Park Liter, MD, [facility] 07/02/2018 10:44 AM    [place]

## 2018-07-02 NOTE — Patient Instructions (Signed)
Medication Instructions:  Your physician recommends that you continue on your current medications as directed. Please refer to the Current Medication list given to you today.  If you need a refill on your cardiac medications before your next appointment, please call your pharmacy.   Lab work: None ordered If you have labs (blood work) drawn today and your tests are completely normal, you will receive your results only by: Marland Kitchen MyChart Message (if you have MyChart) OR . A paper copy in the mail If you have any lab test that is abnormal or we need to change your treatment, we will call you to review the results.  Testing/Procedures: Your physician has requested that you have an echocardiogram. Echocardiography is a painless test that uses sound waves to create images of your heart. It provides your doctor with information about the size and shape of your heart and how well your heart's chambers and valves are working. This procedure takes approximately one hour. There are no restrictions for this procedure. You will have this in 6 months (April)  Follow-Up: At [facility], you and your health needs are our priority.  As part of our continuing mission to provide you with exceptional heart care, we have created designated Provider Care Teams.  These Care Teams include your primary Cardiologist (physician) and Advanced Practice Providers (APPs -  Physician Assistants and Nurse Practitioners) who all work together to provide you with the care you need, when you need it. You will need a follow up appointment in 6 months.  Please call our office 2 months in advance to schedule this appointment.  You may see Jenne Campus, MD or another member of our [place] Provider Team in [place]: Shirlee More, MD . Jyl Heinz, MD  Any Other Special Instructions Will Be Listed Below (If Applicable).

## 2018-07-02 NOTE — Addendum Note (Signed)
Addended by: Aleatha Borer on: 07/02/2018 10:53 AM   Modules accepted: Orders

## 2018-07-03 ENCOUNTER — Ambulatory Visit
Admission: RE | Admit: 2018-07-03 | Discharge: 2018-07-03 | Disposition: A | Discharge status: Home / Self Care | Payer: Medicare Other | Source: Ambulatory Visit | Attending: Thoracic Surgery (Cardiothoracic Vascular Surgery) | Admitting: Thoracic Surgery (Cardiothoracic Vascular Surgery)

## 2018-07-03 ENCOUNTER — Ambulatory Visit: Payer: Medicare Other | Admitting: Thoracic Surgery (Cardiothoracic Vascular Surgery)

## 2018-07-03 DIAGNOSIS — R918 Other nonspecific abnormal finding of lung field: Secondary | ICD-10-CM

## 2018-07-03 DIAGNOSIS — R911 Solitary pulmonary nodule: Secondary | ICD-10-CM | POA: Diagnosis not present

## 2018-07-11 DIAGNOSIS — Z23 Encounter for immunization: Secondary | ICD-10-CM | POA: Diagnosis not present

## 2018-07-17 ENCOUNTER — Other Ambulatory Visit: Payer: Self-pay

## 2018-07-17 ENCOUNTER — Ambulatory Visit (INDEPENDENT_AMBULATORY_CARE_PROVIDER_SITE_OTHER): Payer: Medicare Other | Admitting: Thoracic Surgery (Cardiothoracic Vascular Surgery)

## 2018-07-17 ENCOUNTER — Encounter: Payer: Self-pay | Admitting: Thoracic Surgery (Cardiothoracic Vascular Surgery)

## 2018-07-17 VITALS — BP 126/72 | HR 66 | Resp 16 | Ht 73.0 in | Wt 215.0 lb

## 2018-07-17 DIAGNOSIS — I712 Thoracic aortic aneurysm, without rupture, unspecified: Secondary | ICD-10-CM

## 2018-07-17 DIAGNOSIS — D381 Neoplasm of uncertain behavior of trachea, bronchus and lung: Secondary | ICD-10-CM

## 2018-07-17 NOTE — Progress Notes (Signed)
[address]       [place] [address]             [phone]    HPI: Timothy Moran returns for follow-up regarding his right lung nodules.  Timothy Moran is a 72 year old man with a past history of bilateral pulmonary emboli, possible pulmonary hypertension, hypertension, stage III chronic kidney disease, benign brain tumor, and a left bundle branch block.  In March 2019 he experienced chest pain which turned out to be reflux.  During his work-up he had a CT angiogram which showed a 10 x 8 x 7 mm opacity in the posterior segment of the right upper lobe.  There was a larger nodule in the right lower lobe along the diaphragm.  On follow-up these nodules were essentially the same size.  PET/CT showed mild uptake in the right upper lobe nodule with an SUV of 1.7 there was no uptake in the lower lobe nodule.  I saw him in July.  We discussed the options of radiographic follow-up versus attempting to biopsy versus surgical resection.  He wished to opt for radiographic follow-up due to a trip he had planned to go to [place]  He feels well.  He has no complaints.    Past Medical History:  Diagnosis Date  . Benign brain tumor ([place])   . Chronic kidney disease    Stage 3  . History of pulmonary embolus (PE)   . Hypertension     Current Outpatient Medications  Medication Sig Dispense Refill  . aspirin EC 81 MG tablet Take 1 tablet (81 mg total) by mouth daily. 90 tablet 3  . B Complex-C (SUPER B COMPLEX PO) Take 1 tablet by mouth daily.    . cetirizine (ZYRTEC) 10 MG tablet Take 10 mg by mouth daily.    . Cholecalciferol (VITAMIN D3) 5000 units TABS Take 1 tablet by mouth daily.     . Magnesium 400 MG CAPS Take 400 mg by mouth daily.     Marland Kitchen oxymetazoline (AFRIN) 0.05 % nasal spray Place 1 spray into both nostrils daily as needed for congestion.    . tamsulosin (FLOMAX) 0.4 MG CAPS capsule Take 2 capsules by mouth daily.    . valsartan (DIOVAN) 320 MG tablet Take 1  tablet (320 mg total) by mouth daily. 30 tablet 6   No current facility-administered medications for this visit.     Physical Exam BP 126/72 (BP Location: Right Arm, Patient Position: Sitting, Cuff Size: Large)   Pulse 66   Resp 16   Ht 6\' 1"  (1.854 m)   Wt 215 lb (97.5 kg)   SpO2 93% Comment: ON RA  BMI 28.48 kg/m  72 year old man in no acute distress Alert and oriented x3 with no focal deficits Lungs clear with equal breath sounds bilaterally Cardiac regular rate and rhythm  Diagnostic Tests: CT CHEST WITHOUT CONTRAST  TECHNIQUE: Multidetector CT imaging of the chest was performed following the standard protocol without IV contrast.  COMPARISON:  Multiple exams, including 03/13/2018 PET-CT  FINDINGS: Cardiovascular: Coronary, aortic arch, and branch vessel atherosclerotic vascular disease. Ascending aortic aneurysm, 4.2 cm in diameter, previously measured 4.1 cm. Trace fluid anteriorly in the pericardium.  Mediastinum/Nodes: Small mediastinal lymph nodes are similar to prior not currently pathologically enlarged.  Lungs/Pleura: 1.3 by 0.7 by 0.6 cm right upper lobe pulmonary nodule on image 139/4 observed. On recent PET-CT this had a maximum SUV of 1.7. On the earliest available comparison chest  CT of 12/04/2017, I measure this nodule at 1.3 by 0.7 by 0.6 cm (stable).  The oblong pleural-based nodule along the right hemidiaphragm measures 2.2 by 0.8 cm on image 110/8, and was 2.1 by 0.9 cm on 12/04/2017, essentially stable. This did not have accentuated metabolic activity on the recent PET-CT.  No new nodules are identified.  No new pleural abnormality.  Upper Abdomen: Unremarkable  Musculoskeletal: Possible free osteochondral fragments in the right glenohumeral joint with advanced degenerative arthropathy. Thoracic spondylosis.  The T8 vertebral level, there is abnormal accentuated density in the spinal canal eccentric to the right measuring 1.1 by  0.7 by 1.8 cm, most likely to be a faintly calcified meningioma but not entirely specific.  A 9 mm faintly sclerotic lesion eccentric to the left in the posterior portion of the T9 vertebral body was not previously hypermetabolic and is likely benign.  IMPRESSION: 1. Stable 1.3 by 0.7 by 0.6 cm right upper lobe pulmonary nodule which previously had low-grade metabolic activity. A nodule is stable in size based on my measurements compared to the earliest available comparison of 12/04/2017. Given the activity, low-grade adenocarcinoma is a distinct possibility is despite the lack of growth. No appreciable pathologic adenopathy. 2. 2.2 by 0.8 cm pleural-based nodule along the right hemidiaphragm is not changed from the earliest available comparison and was not appreciably hypermetabolic. This may warrant surveillance but is most likely benign. 3. There is a dense lesion eccentric to the right in the spinal canal at the T8 vertebral level. I would favor this as being a meningioma based on morphology and appearance, although other etiologies are possible. Consider thoracic spine MRI with and without contrast for further characterization. 4. Advanced degenerative glenohumeral arthropathy on the right with moderate degenerative glenohumeral arthropathy on the left. Possible free osteochondral fragments especially on the right. 5.  Aortic Atherosclerosis.   Coronary atherosclerosis. 6. Ascending aortic aneurysm, 4.2 cm in diameter. Recommend annual imaging followup by CTA or MRA. This recommendation follows 2010 ACCF/AHA/AATS/ACR/ASA/SCA/SCAI/SIR/STS/SVM Guidelines for the Diagnosis and Management of Patients with Thoracic Aortic Disease. Circulation. 2010; 121: N361-W431   Electronically Signed   By: Van Clines M.D.   On: 07/03/2018 09:40 I personally reviewed the CT images and concur with the findings noted above  Impression: [name] is a 71 year old gentleman with a  past medical history of hypertension, bilateral pulmonary emboli, a benign brain tumor, stage III chronic kidney disease in the left bundle branch block.  He also has 2 right lung nodules and an ascending aneurysm.  Ascending aneurysm-stable at 4.1 to 4.2 cm.  Needs continued follow-up.  Lung nodules-nodules are unchanged going back to his CT in March 2019.  Low level activity in the upper lobe nodule on PET and no activity in the lower lobe nodule.  We again discussed intervention in the form of biopsy or resection versus radiographic follow-up.  He wishes to continue with radiographic follow-up.  Spinal canal lesion-added on CT.  In retrospect this was present on his CTs and PET going back to March.  I offered to do an MR and refer to neurosurgery.  He does not wish to do that at this time.  Plan: Return in 6 months with CT angiogram to follow-up ascending aneurysm, lung nodules, and thoracic spine abnormality.  Timothy Nakayama, MD [facility] [phone]

## 2018-08-02 DIAGNOSIS — I10 Essential (primary) hypertension: Secondary | ICD-10-CM | POA: Diagnosis not present

## 2018-08-02 DIAGNOSIS — N183 Chronic kidney disease, stage 3 (moderate): Secondary | ICD-10-CM | POA: Diagnosis not present

## 2018-08-02 DIAGNOSIS — N401 Enlarged prostate with lower urinary tract symptoms: Secondary | ICD-10-CM | POA: Diagnosis not present

## 2018-08-02 DIAGNOSIS — J069 Acute upper respiratory infection, unspecified: Secondary | ICD-10-CM | POA: Diagnosis not present

## 2018-08-06 DIAGNOSIS — Z1159 Encounter for screening for other viral diseases: Secondary | ICD-10-CM | POA: Diagnosis not present

## 2018-08-06 DIAGNOSIS — Z1389 Encounter for screening for other disorder: Secondary | ICD-10-CM | POA: Diagnosis not present

## 2018-08-06 DIAGNOSIS — Z0000 Encounter for general adult medical examination without abnormal findings: Secondary | ICD-10-CM | POA: Diagnosis not present

## 2018-08-06 DIAGNOSIS — Z7189 Other specified counseling: Secondary | ICD-10-CM | POA: Diagnosis not present

## 2018-08-13 DIAGNOSIS — Z23 Encounter for immunization: Secondary | ICD-10-CM | POA: Diagnosis not present

## 2018-08-13 DIAGNOSIS — H6983 Other specified disorders of Eustachian tube, bilateral: Secondary | ICD-10-CM | POA: Diagnosis not present

## 2018-08-20 ENCOUNTER — Telehealth: Payer: Self-pay | Admitting: Cardiology

## 2018-08-20 MED ORDER — VALSARTAN 320 MG PO TABS: 320.0000 mg | ORAL_TABLET | Freq: Every day | ORAL | 1 refills | Status: DC

## 2018-08-20 NOTE — Telephone Encounter (Signed)
Valsartan 320 mg daily refilled

## 2018-08-20 NOTE — Telephone Encounter (Signed)
° ° ° °  1. Which medications need to be refilled? (please list name of each medication and dose if known) valsartan 320mg  tablet  2. Which pharmacy/location (including street and city if local pharmacy) is medication to be sent to?[facility] on [address]  3. Do they need a 30 day or 90 day supply? [place]

## 2018-08-24 DIAGNOSIS — H6980 Other specified disorders of Eustachian tube, unspecified ear: Secondary | ICD-10-CM | POA: Diagnosis not present

## 2018-08-24 DIAGNOSIS — H6990 Unspecified Eustachian tube disorder, unspecified ear: Secondary | ICD-10-CM

## 2018-08-24 DIAGNOSIS — H900 Conductive hearing loss, bilateral: Secondary | ICD-10-CM | POA: Insufficient documentation

## 2018-08-24 HISTORY — DX: Unspecified eustachian tube disorder, unspecified ear: H69.90

## 2018-08-24 HISTORY — DX: Conductive hearing loss, bilateral: H90.0

## 2018-08-27 DIAGNOSIS — M25512 Pain in left shoulder: Secondary | ICD-10-CM | POA: Diagnosis not present

## 2018-09-13 DIAGNOSIS — Z713 Dietary counseling and surveillance: Secondary | ICD-10-CM | POA: Diagnosis not present

## 2018-09-13 DIAGNOSIS — Z6831 Body mass index (BMI) 31.0-31.9, adult: Secondary | ICD-10-CM | POA: Diagnosis not present

## 2018-09-13 DIAGNOSIS — M25512 Pain in left shoulder: Secondary | ICD-10-CM | POA: Diagnosis not present

## 2018-09-24 ENCOUNTER — Other Ambulatory Visit: Payer: Self-pay

## 2018-09-24 ENCOUNTER — Ambulatory Visit: Payer: Medicare Other | Attending: Family Medicine | Admitting: Physical Therapy

## 2018-09-24 DIAGNOSIS — M25612 Stiffness of left shoulder, not elsewhere classified: Secondary | ICD-10-CM | POA: Insufficient documentation

## 2018-09-24 DIAGNOSIS — M25512 Pain in left shoulder: Secondary | ICD-10-CM | POA: Insufficient documentation

## 2018-09-24 NOTE — Patient Instructions (Signed)
Access Code: [email or web address]  URL: [email or web address]  Date: 09/24/2018  Prepared by: Almyra Free Anneli Bing   Exercises  Scapular Retraction with Resistance - 10 reps - 3 sets - 1x daily - 7x weekly  Scapular Retraction with Resistance Advanced - 10 reps - 3 sets - 1x daily - 7x weekly

## 2018-09-24 NOTE — Therapy (Signed)
[address] [place], [place], [address] Phone: [phone]   Fax:  [phone]  Physical Therapy Evaluation  Patient Details  Name: Timothy Moran MRN: [id number] Date of Birth: March 14, 1946 Referring Provider (PT): Cari Caraway   Encounter Date: 09/24/2018  PT End of Session - 09/24/18 1104    Visit Number  1    Date for PT Re-Evaluation  11/19/18    Authorization Type  add KX    PT Start Time  1104    PT Stop Time  1150    PT Time Calculation (min)  46 min    Activity Tolerance  Patient tolerated treatment well    Behavior During Therapy  [facility] for tasks assessed/performed       Past Medical History:  Diagnosis Date  . Benign brain tumor ([place])   . Chronic kidney disease    Stage 3  . History of pulmonary embolus (PE)   . Hypertension     Past Surgical History:  Procedure Laterality Date  . APPENDECTOMY    . HERNIA REPAIR      There were no vitals filed for this visit.   Subjective Assessment - 09/24/18 1105    Subjective  Patient began experiencing left shoulder pain in December after using a sledge hammer to break up a toilet. He had xrays which were neg for fx at urgent care.     Pertinent History  benign brain tumor, left THA 2005    Diagnostic tests  xray    Patient Stated Goals  get rid of pain and get movment back    Currently in Pain?  Yes    Pain Score  6     Pain Location  Shoulder    Pain Orientation  Left;Anterior    Pain Descriptors / Indicators  Stabbing    Pain Type  Acute pain    Pain Onset  More than a month ago    Pain Frequency  Intermittent    Aggravating Factors   flexion from extension, protraction    Pain Relieving Factors  rest    Effect of Pain on Daily Activities  donning coats/shirts, lefting         [facility] PT Assessment - 09/24/18 0001      Assessment   Medical Diagnosis  left anterior shoulder pain    Referring Provider (PT)  Cari Caraway    Onset Date/Surgical Date  08/12/18    Hand Dominance  Right    Next MD Visit  none    Prior Therapy  no      Precautions   Precautions  None      Balance Screen   Has the patient fallen in the past 6 months  No    Has the patient had a decrease in activity level because of a fear of falling?   No    Is the patient reluctant to leave their home because of a fear of falling?   No      Home Film/video editor residence    Living Arrangements  Spouse/significant other      Prior Function   Level of Independence  Independent    Vocation  Retired    Leisure  Haematologist, putting xmas decs away in attic      Posture/Postural Control   Posture/Postural Control  Postural limitations    Postural Limitations  Rounded Shoulders;Forward head      ROM /  Strength   AROM / PROM / Strength  AROM;PROM;Strength      AROM   Overall AROM Comments  pain with flexion and at end range ER and with active IR in supine; pain with active ER in supine from IR position    AROM Assessment Site  Shoulder    Right/Left Shoulder  Left    Left Shoulder Extension  --   full   Left Shoulder Flexion  138 Degrees   right shoulder 143   Left Shoulder ABduction  132 Degrees    Left Shoulder Internal Rotation  75 Degrees    Left Shoulder External Rotation  75 Degrees      PROM   Overall PROM Comments  pain with active ER in supine from IR position    PROM Assessment Site  Shoulder    Right/Left Shoulder  Left    Left Shoulder Flexion  145 Degrees    Left Shoulder ABduction  138 Degrees    Left Shoulder Internal Rotation  78 Degrees    Left Shoulder External Rotation  77 Degrees      Strength   Overall Strength Comments  pain with resisted flex, abd and ER    Strength Assessment Site  Shoulder    Right/Left Shoulder  Left    Left Shoulder Flexion  4/5    Left Shoulder Extension  5/5    Left Shoulder ABduction  4+/5    Left Shoulder Internal Rotation  5/5    Left Shoulder External  Rotation  4+/5      Flexibility   Soft Tissue Assessment /Muscle Length  --   tight pectorals bil     Palpation   Spinal mobility  decreased thoracic extension    Palpation comment  mild tenderness with deep palpation to anterior shoulder, else unremarkable      Special Tests    Special Tests  Rotator Cuff Impingement    Rotator Cuff Impingment tests  Neer impingement test;Hawkins- Kennedy test;Lift- off test      Neer Impingement test    Findings  Positive    Side  Left      Hawkins-Kennedy test   Findings  Negative    Side  Left      Lift-Off test   Findings  Negative    Side  Left                Objective measurements completed on examination: See above findings.              PT Education - 09/24/18 1151    Education Details  HEP; doorway stretch painful to shoulder    Person(s) Educated  Patient    Methods  Explanation;Handout;Demonstration    Comprehension  Verbalized understanding;Returned demonstration       PT Short Term Goals - 09/24/18 1204      PT SHORT TERM GOAL #1   Title  Ind with initial HEP    Time  2    Period  Weeks    Status  New    Target Date  10/08/18      PT SHORT TERM GOAL #2   Title  decreased left shoulder pain with ADLS by 01%    Time  4    Period  Weeks    Status  New    Target Date  10/22/18        PT Long Term Goals - 09/24/18 1205      PT LONG TERM GOAL #1  Title  Patient to report decreased shoulder pain by 75% or more with ADLS.    Time  8    Period  Weeks    Status  New    Target Date  11/19/18      PT LONG TERM GOAL #2   Title  Patient able to don/doff clothing without pain in the left shoulder    Time  8    Period  Weeks    Status  New      PT LONG TERM GOAL #3   Title  Patient able to perform overhead ADLS with 8/29 pain or less in the left shoulder.    Time  8    Period  Weeks    Status  New      PT LONG TERM GOAL #4   Title  Patient to demo 5/5 left shoulder strength without  pain.    Time  8    Period  Weeks    Status  New      PT LONG TERM GOAL #5   Title  Patient ind with postural exercises to prevent further injury.    Time  8    Period  Weeks    Status  New             Plan - 09/24/18 1157    Clinical Impression Statement  Patient presents with complaints of left shoulder pain with activity after using a sledgehammer in December. He has pain with resisted flex, ABD and ER resulting in weakness with MMT and limiting his ADLS including donning/doffing clothing and lifting even light to med weight items. He has significant rounded shoulders, tight pectorals and decreased thoracic extension resulting in decreased shoulder flexion bil.      History and Personal Factors relevant to plan of care:  benign brain tumor, left THA 2005    Clinical Presentation  Stable    Clinical Decision Making  Low    Rehab Potential  Excellent    PT Frequency  2x / week    PT Duration  8 weeks    PT Treatment/Interventions  ADLs/Self Care Home Management;Electrical Stimulation;Cryotherapy;Iontophoresis 4mg /ml Dexamethasone;Moist Heat;Therapeutic exercise;Neuromuscular re-education;Patient/family education;Dry needling;Manual techniques    PT Next Visit Plan  begin shoulder/scapular strengthening; chest flexibility, thoracic extension; possibly ionto    PT Home Exercise Plan  [email or web address] rows/extension red band    Consulted and Agree with Plan of Care  Patient       Patient will benefit from skilled therapeutic intervention in order to improve the following deficits and impairments:  Pain, Postural dysfunction, Decreased range of motion, Decreased strength, Impaired UE functional use  Visit Diagnosis: Acute pain of left shoulder - Plan: PT plan of care cert/re-cert  Stiffness of left shoulder, not elsewhere classified - Plan: PT plan of care cert/re-cert     Problem List Patient Active Problem List   Diagnosis Date Noted  . Pulmonary hypertension due to  thromboembolism ([place]) 03/09/2018  . Solitary pulmonary nodule on lung CT 03/08/2018  . Ascending aortic aneurysm ([place]) 01/29/2018  . Sinus bradycardia 12/22/2017  . Left bundle branch block 12/19/2017  . Personal history of pulmonary embolism 12/19/2017  . Chronic kidney disease, stage III (moderate) ([place]) 12/19/2017  . Benign neoplasm of brain ([place]) 12/19/2017  . Chest pain 12/04/2017    Madelyn Flavors PT 09/24/2018, 12:13 PM  [address] [place], [place], [address] Phone: [phone]   Fax:  [phone]  Name: Dago Jungwirth MRN: [id number] Date of Birth: 09-26-1945

## 2018-09-26 ENCOUNTER — Ambulatory Visit: Payer: Medicare Other | Admitting: Physical Therapy

## 2018-09-26 DIAGNOSIS — M25512 Pain in left shoulder: Secondary | ICD-10-CM

## 2018-09-26 DIAGNOSIS — M25612 Stiffness of left shoulder, not elsewhere classified: Secondary | ICD-10-CM

## 2018-09-26 NOTE — Therapy (Signed)
[address] [place], [place], [address] Phone: [phone]   Fax:  [phone]  Physical Therapy Treatment  Patient Details  Name: Timothy Moran MRN: [id number] Date of Birth: 15-Dec-1945 Referring Provider (PT): Cari Caraway   Encounter Date: 09/26/2018  PT End of Session - 09/26/18 1511    Visit Number  2    Date for PT Re-Evaluation  11/19/18    PT Start Time  6767    PT Stop Time  1545    PT Time Calculation (min)  60 min       Past Medical History:  Diagnosis Date  . Benign brain tumor ([place])   . Chronic kidney disease    Stage 3  . History of pulmonary embolus (PE)   . Hypertension     Past Surgical History:  Procedure Laterality Date  . APPENDECTOMY    . HERNIA REPAIR      There were no vitals filed for this visit.  Subjective Assessment - 09/26/18 1439    Subjective  i was doing great and ex were helping until I lifted bags of mulch yesterday    Currently in Pain?  Yes    Pain Score  6     Pain Location  Shoulder    Pain Orientation  Anterior;Lateral                       [facility] Adult PT Treatment/Exercise - 09/26/18 0001      Exercises   Exercises  Shoulder      Shoulder Exercises: Standing   External Rotation  Strengthening;Both;15 reps;Theraband    Theraband Level (Shoulder External Rotation)  Level 2 (Red)    ABduction  Strengthening;Both;15 reps;Theraband    Theraband Level (Shoulder ABduction)  Level 2 (Red)    ABduction Limitations  some pain    Extension  Strengthening;Both;15 reps;Theraband    Theraband Level (Shoulder Extension)  Level 2 (Red)    Retraction  Strengthening;12 reps;Both;Theraband    Theraband Level (Shoulder Retraction)  Level 2 (Red)    Other Standing Exercises  empty can 10 times -very painful    Other Standing Exercises  PNF 10 times , 2# 10 times      Shoulder Exercises: ROM/Strengthening   UBE (Upper Arm Bike)  L 3 2 fwd/2  back      Modalities   Modalities  Electrical Stimulation;Iontophoresis;Cryotherapy      Cryotherapy   Number Minutes Cryotherapy  15 Minutes    Cryotherapy Location  Shoulder    Type of Cryotherapy  Ice pack      Electrical Stimulation   Electrical Stimulation Location  left shoulder    Electrical Stimulation Action  IFC    Electrical Stimulation Goals  Pain;Edema      Iontophoresis   Type of Iontophoresis  Dexamethasone    Location  left shld    Dose  1.2 cc dex    Time  4 hour patch 80 mA      Manual Therapy   Manual Therapy  Passive ROM;Joint mobilization    Manual therapy comments  difficulty relaxing    Passive ROM  left shld             PT Education - 09/26/18 1510    Education Details  added scap stab ex    Person(s) Educated  Patient    Methods  Explanation;Demonstration;Verbal cues;Handout    Comprehension  Verbalized understanding;Returned demonstration  PT Short Term Goals - 09/24/18 1204      PT SHORT TERM GOAL #1   Title  Ind with initial HEP    Time  2    Period  Weeks    Status  New    Target Date  10/08/18      PT SHORT TERM GOAL #2   Title  decreased left shoulder pain with ADLS by 56%    Time  4    Period  Weeks    Status  New    Target Date  10/22/18        PT Long Term Goals - 09/24/18 1205      PT LONG TERM GOAL #1   Title  Patient to report decreased shoulder pain by 75% or more with ADLS.    Time  8    Period  Weeks    Status  New    Target Date  11/19/18      PT LONG TERM GOAL #2   Title  Patient able to don/doff clothing without pain in the left shoulder    Time  8    Period  Weeks    Status  New      PT LONG TERM GOAL #3   Title  Patient able to perform overhead ADLS with 3/89 pain or less in the left shoulder.    Time  8    Period  Weeks    Status  New      PT LONG TERM GOAL #4   Title  Patient to demo 5/5 left shoulder strength without pain.    Time  8    Period  Weeks    Status  New      PT LONG  TERM GOAL #5   Title  Patient ind with postural exercises to prevent further injury.    Time  8    Period  Weeks    Status  New            Plan - 09/26/18 1511    Clinical Impression Statement  pt very guarded with mxmt esp laterally and IR. added to HEP . needed to modify ex in clinic d/t pain/weakness    PT Treatment/Interventions  ADLs/Self Care Home Management;Electrical Stimulation;Cryotherapy;Iontophoresis 4mg /ml Dexamethasone;Moist Heat;Therapeutic exercise;Neuromuscular re-education;Patient/family education;Dry needling;Manual techniques    PT Next Visit Plan  asses and progress       Patient will benefit from skilled therapeutic intervention in order to improve the following deficits and impairments:  Pain, Postural dysfunction, Decreased range of motion, Decreased strength, Impaired UE functional use  Visit Diagnosis: Acute pain of left shoulder  Stiffness of left shoulder, not elsewhere classified     Problem List Patient Active Problem List   Diagnosis Date Noted  . Pulmonary hypertension due to thromboembolism ([place]) 03/09/2018  . Solitary pulmonary nodule on lung CT 03/08/2018  . Ascending aortic aneurysm ([place]) 01/29/2018  . Sinus bradycardia 12/22/2017  . Left bundle branch block 12/19/2017  . Personal history of pulmonary embolism 12/19/2017  . Chronic kidney disease, stage III (moderate) ([place]) 12/19/2017  . Benign neoplasm of brain ([place]) 12/19/2017  . Chest pain 12/04/2017    PAYSEUR,ANGIE PTA 09/26/2018, 3:13 PM  [place] [address] [place], [place], [address] Phone: [phone]   Fax:  [phone]  Name: Murrell Dome MRN: [id number] Date of Birth: [birth year]

## 2018-10-01 ENCOUNTER — Ambulatory Visit: Payer: Medicare Other | Admitting: Physical Therapy

## 2018-10-01 ENCOUNTER — Encounter: Payer: Self-pay | Admitting: Physical Therapy

## 2018-10-01 DIAGNOSIS — M25512 Pain in left shoulder: Secondary | ICD-10-CM | POA: Diagnosis not present

## 2018-10-01 DIAGNOSIS — M25612 Stiffness of left shoulder, not elsewhere classified: Secondary | ICD-10-CM

## 2018-10-01 NOTE — Therapy (Signed)
[place] [address] [place], [place], [address] Phone: [phone]   Fax:  [phone]  Physical Therapy Treatment  Patient Details  Name: Timothy Moran MRN: [id number] Date of Birth: 1946/04/01 Referring Provider (PT): Cari Caraway   Encounter Date: 10/01/2018  PT End of Session - 10/01/18 0850    Visit Number  3    Date for PT Re-Evaluation  11/19/18    PT Start Time  0846    PT Stop Time  0940    PT Time Calculation (min)  54 min    Activity Tolerance  Patient limited by pain    Behavior During Therapy  [facility] for tasks assessed/performed       Past Medical History:  Diagnosis Date  . Benign brain tumor ([place])   . Chronic kidney disease    Stage 3  . History of pulmonary embolus (PE)   . Hypertension     Past Surgical History:  Procedure Laterality Date  . APPENDECTOMY    . HERNIA REPAIR      There were no vitals filed for this visit.  Subjective Assessment - 10/01/18 0848    Subjective  Pt reports he still has pain up to 7/10 in Lt shoulder with certain motions, but at rest it's a 0/10.  He has been doing his band exercises 2x/day, but is having difficulty with some.      Patient Stated Goals  get rid of pain and get movement back    Currently in Pain?  No/denies    Pain Score  0-No pain   no pain at rest.    Pain Relieving Factors  rest    Effect of Pain on Daily Activities  donning coats/shirts; lifting arm out to side.          [facility] PT Assessment - 10/01/18 0001      Assessment   Medical Diagnosis  left anterior shoulder pain    Referring Provider (PT)  Cari Caraway    Onset Date/Surgical Date  08/12/18    Hand Dominance  Right    Next MD Visit  none    Prior Therapy  no      [facility] Adult PT Treatment/Exercise - 10/01/18 0001      Shoulder Exercises: Supine   Horizontal ABduction  Both;15 reps;Theraband   painfree   Theraband Level (Shoulder Horizontal ABduction)  Level 2  (Red)    External Rotation  --    External Rotation Limitations  trial of red, then yellow - both increased pain -- switched to standing isometric    Diagonals  Left;Right;10 reps;Theraband    Theraband Level (Shoulder Diagonals)  Level 2 (Red)    Other Supine Exercises  trial of sustained horiz abdct stretch with arms ~30 deg (painful with LUE).       Shoulder Exercises: Standing   External Rotation  Strengthening;Left;10 reps   5 sec isometric   External Rotation Limitations  painful; switched to isometric    Extension  Strengthening;Both;10 reps;Theraband    Theraband Level (Shoulder Extension)  Level 2 (Red)    Row  Both;10 reps;Theraband    Theraband Level (Shoulder Row)  Level 2 (Red)      Shoulder Exercises: ROM/Strengthening   UBE (Upper Arm Bike)  L2:  2 fwd/2 back      Shoulder Exercises: Stretch   Other Shoulder Stretches  3 position doorway stretch x 20 sec, only able to tolerate low position, others were to  painful to sustain.     Other Shoulder Stretches  bilat shoulder ext AAROM with cane x 10, 5 sec pause (for stretch); shoulder flexion with cane x 10 in supine (some pain ~50 deg)      Modalities   Modalities  Electrical Stimulation;Iontophoresis;Moist Heat      Moist Heat Therapy   Number Minutes Moist Heat  15 Minutes    Moist Heat Location  Shoulder   Lt     Electrical Stimulation   Electrical Stimulation Location  Lt ant/lateral shoulder    Electrical Stimulation Action  IFC    Electrical Stimulation Parameters  to pt tolerance    Electrical Stimulation Goals  Pain      Iontophoresis   Type of Iontophoresis  Dexamethasone    Location  Lt lateral shoulder     Dose  1.0 cc    Time  80 mA patch - 4 Hrs             PT Education - 10/01/18 0933    Education Details  updated HEP     Person(s) Educated  Patient    Methods  Explanation;Handout;Demonstration;Verbal cues    Comprehension  Returned demonstration;Verbalized understanding       PT  Short Term Goals - 09/24/18 1204      PT SHORT TERM GOAL #1   Title  Ind with initial HEP    Time  2    Period  Weeks    Status  New    Target Date  10/08/18      PT SHORT TERM GOAL #2   Title  decreased left shoulder pain with ADLS by 85%    Time  4    Period  Weeks    Status  New    Target Date  10/22/18        PT Long Term Goals - 09/24/18 1205      PT LONG TERM GOAL #1   Title  Patient to report decreased shoulder pain by 75% or more with ADLS.    Time  8    Period  Weeks    Status  New    Target Date  11/19/18      PT LONG TERM GOAL #2   Title  Patient able to don/doff clothing without pain in the left shoulder    Time  8    Period  Weeks    Status  New      PT LONG TERM GOAL #3   Title  Patient able to perform overhead ADLS with 0/27 pain or less in the left shoulder.    Time  8    Period  Weeks    Status  New      PT LONG TERM GOAL #4   Title  Patient to demo 5/5 left shoulder strength without pain.    Time  8    Period  Weeks    Status  New      PT LONG TERM GOAL #5   Title  Patient ind with postural exercises to prevent further injury.    Time  8    Period  Weeks    Status  New            Plan - 10/01/18 7412    Clinical Impression Statement  Pt continues to have painful arc in Lt shoulder with active and active assisted flexion/ abdct in supine and standing.  HEP updated with modifications of resisted horiz abdct in supine  instead of standing, and ER isometric instead of band resistance; both modifications due to increased pain.  Pt reported decrease in pain with estim/MHP at end of session.  Progressing gradually towards therapy goals.     Rehab Potential  Excellent    PT Frequency  2x / week    PT Duration  8 weeks    PT Treatment/Interventions  ADLs/Self Care Home Management;Electrical Stimulation;Cryotherapy;Iontophoresis 4mg /ml Dexamethasone;Moist Heat;Therapeutic exercise;Neuromuscular re-education;Patient/family education;Dry  needling;Manual techniques    PT Next Visit Plan  continue progressive ROM / gentle strengthening exercises for Lt shoulder.     PT Home Exercise Plan  medbridge: access code [email or web address]    Consulted and Agree with Plan of Care  Patient       Patient will benefit from skilled therapeutic intervention in order to improve the following deficits and impairments:  Pain, Postural dysfunction, Decreased range of motion, Decreased strength, Impaired UE functional use  Visit Diagnosis: Acute pain of left shoulder  Stiffness of left shoulder, not elsewhere classified     Problem List Patient Active Problem List   Diagnosis Date Noted  . Pulmonary hypertension due to thromboembolism ([place]) 03/09/2018  . Solitary pulmonary nodule on lung CT 03/08/2018  . Ascending aortic aneurysm ([place]) 01/29/2018  . Sinus bradycardia 12/22/2017  . Left bundle branch block 12/19/2017  . Personal history of pulmonary embolism 12/19/2017  . Chronic kidney disease, stage III (moderate) ([place]) 12/19/2017  . Benign neoplasm of brain ([place]) 12/19/2017  . Chest pain 12/04/2017   Kerin Perna, PTA 10/01/18 11:19 AM  [place] [address] [place], [place], [address] Phone: [phone]   Fax:  [phone]  Name: Timothy Moran MRN: [id number] Date of Birth: [birth year]

## 2018-10-01 NOTE — Patient Instructions (Signed)
Access Code: [email or web address]  URL: [email or web address]  Date: 10/01/2018  Prepared by: Kerin Perna   Exercises  Scapular Retraction with Resistance - 10 reps - 2 sets - 1x daily - 7x weekly  Scapular Retraction with Resistance Advanced - 10 reps - 2 sets - 1x daily - 7x weekly  Isometric Shoulder External Rotation at Wall - 10 reps - 1 sets - 5 hold - 1-2x daily - 7x weekly  Supine Shoulder Horizontal Abduction with Resistance - 10 reps - 2 sets - 1x daily - 7x weekly  Doorway Pec Stretch at 60 Elevation - 3 reps - 1 sets - 20 hold - 1-2x daily - 7x weekly  Standing Shoulder Extension with [place] - 10 reps - 1 sets - 5-10 hold - 1-2x daily - 7x weekly

## 2018-10-03 ENCOUNTER — Ambulatory Visit: Payer: Medicare Other | Admitting: Physical Therapy

## 2018-10-03 ENCOUNTER — Encounter: Payer: Self-pay | Admitting: Physical Therapy

## 2018-10-03 DIAGNOSIS — M25612 Stiffness of left shoulder, not elsewhere classified: Secondary | ICD-10-CM | POA: Diagnosis not present

## 2018-10-03 DIAGNOSIS — M25512 Pain in left shoulder: Secondary | ICD-10-CM

## 2018-10-03 NOTE — Therapy (Signed)
[place] [address] [place], [place], [address] Phone: [phone]   Fax:  [phone]  Physical Therapy Treatment  Patient Details  Name: Timothy Moran MRN: [id number] Date of Birth: Jul 02, 1946 Referring Provider (PT): Cari Caraway   Encounter Date: 10/03/2018  PT End of Session - 10/03/18 0921    Visit Number  4    Date for PT Re-Evaluation  11/19/18    Authorization Type  add KX    PT Start Time  0844    PT Stop Time  0940    PT Time Calculation (min)  56 min    Activity Tolerance  Patient limited by pain;Patient tolerated treatment well    Behavior During Therapy  [facility] for tasks assessed/performed       Past Medical History:  Diagnosis Date  . Benign brain tumor ([place])   . Chronic kidney disease    Stage 3  . History of pulmonary embolus (PE)   . Hypertension     Past Surgical History:  Procedure Laterality Date  . APPENDECTOMY    . HERNIA REPAIR      There were no vitals filed for this visit.  Subjective Assessment - 10/03/18 0842    Subjective  "It seems to be getting better"    Currently in Pain?  No/denies         [facility] PT Assessment - 10/03/18 0001      AROM   Left Shoulder Flexion  140 Degrees    Left Shoulder ABduction  135 Degrees                   [facility] Adult PT Treatment/Exercise - 10/03/18 0001      Shoulder Exercises: Standing   External Rotation  Left;10 reps;Other (comment)   isometrics 3''   External Rotation Limitations  painful; switched to isometric    Internal Rotation  Left;Theraband;20 reps    Theraband Level (Shoulder Internal Rotation)  Level 2 (Red)    Flexion  Both;Weights;10 reps    Shoulder Flexion Weight (lbs)  2    ABduction  AROM;20 reps;Both    Extension  Strengthening;Both;10 reps;Theraband    Theraband Level (Shoulder Extension)  Level 2 (Red)    Row  Both;Theraband;20 reps    Theraband Level (Shoulder Row)  Level 3 (Green)      Shoulder Exercises: ROM/Strengthening   UBE (Upper Arm Bike)  L2:  68fwd/23back    Other ROM/Strengthening Exercises  25lb Rows & Lats 2x10       Modalities   Modalities  Electrical Stimulation;Iontophoresis;Moist Heat      Moist Heat Therapy   Number Minutes Moist Heat  15 Minutes    Moist Heat Location  Shoulder      Electrical Stimulation   Electrical Stimulation Location  Lt ant/lateral shoulder    Electrical Stimulation Action  IFC    Electrical Stimulation Parameters  to pt tolerance    Electrical Stimulation Goals  Pain      Iontophoresis   Type of Iontophoresis  Dexamethasone    Location  Lt lateral shoulder     Dose  1.0 cc    Time  80 mA patch - 4 Hrs      Manual Therapy   Manual Therapy  Passive ROM;Joint mobilization    Manual therapy comments  difficulty relaxing    Joint Mobilization  grades 3 all directions    Passive ROM  left shld  PT Short Term Goals - 10/03/18 3557      PT SHORT TERM GOAL #1   Title  Ind with initial HEP    Status  Achieved      PT SHORT TERM GOAL #2   Title  decreased left shoulder pain with ADLS by 32%    Status  On-going        PT Long Term Goals - 10/03/18 2025      PT LONG TERM GOAL #1   Title  Patient to report decreased shoulder pain by 75% or more with ADLS.    Status  On-going      PT LONG TERM GOAL #2   Title  Patient able to don/doff clothing without pain in the left shoulder    Status  On-going            Plan - 10/03/18 4270    Clinical Impression Statement  Overall pt did well with the activities. He does report pain with tband external rotation. Switches to ER isometrics with less pain. Pt also reported pain with L shoulder flexion more so when muscles fatigue. Cues needed to keep shoulder back with standing extensions and rows.     Rehab Potential  Excellent    PT Frequency  2x / week    PT Duration  8 weeks    PT Treatment/Interventions  ADLs/Self Care Home Management;Electrical  Stimulation;Cryotherapy;Iontophoresis 4mg /ml Dexamethasone;Moist Heat;Therapeutic exercise;Neuromuscular re-education;Patient/family education;Dry needling;Manual techniques    PT Next Visit Plan  continue progressive ROM / gentle strengthening exercises for Lt shoulder.        Patient will benefit from skilled therapeutic intervention in order to improve the following deficits and impairments:  Pain, Postural dysfunction, Decreased range of motion, Decreased strength, Impaired UE functional use  Visit Diagnosis: Acute pain of left shoulder  Stiffness of left shoulder, not elsewhere classified     Problem List Patient Active Problem List   Diagnosis Date Noted  . Pulmonary hypertension due to thromboembolism ([place]) 03/09/2018  . Solitary pulmonary nodule on lung CT 03/08/2018  . Ascending aortic aneurysm ([place]) 01/29/2018  . Sinus bradycardia 12/22/2017  . Left bundle branch block 12/19/2017  . Personal history of pulmonary embolism 12/19/2017  . Chronic kidney disease, stage III (moderate) ([place]) 12/19/2017  . Benign neoplasm of brain ([place]) 12/19/2017  . Chest pain 12/04/2017    Scot Jun, PTA 10/03/2018, 9:26 AM  [place] [address] [place], [place], [address] Phone: [phone]   Fax:  [phone]  Name: Timothy Moran MRN: [id number] Date of Birth: [birth year]

## 2018-10-08 ENCOUNTER — Encounter: Payer: Self-pay | Admitting: Physical Therapy

## 2018-10-08 ENCOUNTER — Ambulatory Visit: Payer: Medicare Other | Admitting: Physical Therapy

## 2018-10-08 DIAGNOSIS — M25612 Stiffness of left shoulder, not elsewhere classified: Secondary | ICD-10-CM

## 2018-10-08 DIAGNOSIS — M25512 Pain in left shoulder: Secondary | ICD-10-CM

## 2018-10-08 NOTE — Therapy (Signed)
[address] [place], [place], [address] Phone: [phone]   Fax:  [phone]  Physical Therapy Treatment  Patient Details  Name: Timothy Moran MRN: [id number] Date of Birth: Sep 30, 1945 Referring Provider (PT): Cari Caraway   Encounter Date: 10/08/2018  PT End of Session - 10/08/18 1004    Visit Number  5    Date for PT Re-Evaluation  11/19/18    PT Start Time  0930    PT Stop Time  1026    PT Time Calculation (min)  56 min    Activity Tolerance  Patient limited by pain;Patient tolerated treatment well    Behavior During Therapy  [facility] for tasks assessed/performed       Past Medical History:  Diagnosis Date  . Benign brain tumor ([place])   . Chronic kidney disease    Stage 3  . History of pulmonary embolus (PE)   . Hypertension     Past Surgical History:  Procedure Laterality Date  . APPENDECTOMY    . HERNIA REPAIR      There were no vitals filed for this visit.  Subjective Assessment - 10/08/18 0933    Subjective  "More pain than normal, but it is more at night when I am trying to sleep"    Currently in Pain?  No/denies                       [facility] Adult PT Treatment/Exercise - 10/08/18 0001      Shoulder Exercises: Standing   Flexion  Both;Weights;5 reps;Strengthening   x2   Shoulder Flexion Weight (lbs)  2    ABduction  AROM;20 reps;Both    Shoulder ABduction Weight (lbs)  2    Row  Both;Theraband;15 reps;Strengthening   x2   Theraband Level (Shoulder Row)  Level 4 (Blue)    Other Standing Exercises  Flex with cane 2x10      Shoulder Exercises: ROM/Strengthening   UBE (Upper Arm Bike)  L2.5:  29fd/23back    Other ROM/Strengthening Exercises  25lb Rows & Lats 2x10       Modalities   Modalities  Electrical Stimulation;Iontophoresis;Moist Heat      Moist Heat Therapy   Number Minutes Moist Heat  15 Minutes    Moist Heat Location  Shoulder      Electrical  Stimulation   Electrical Stimulation Location  Lt ant/lateral shoulder    Electrical Stimulation Action  IFC    Electrical Stimulation Parameters  supinr, to pt tolerance    Electrical Stimulation Goals  Pain      Iontophoresis   Type of Iontophoresis  Dexamethasone    Location  Lt lateral shoulder     Dose  1.0 cc    Time  80 mA patch - 4 Hrs      Manual Therapy   Manual Therapy  Passive ROM;Joint mobilization    Manual therapy comments  difficulty relaxing, some PROM taken to end range and held    Joint Mobilization  grades 3 all directions    Passive ROM  left shld               PT Short Term Goals - 10/03/18 09357     PT SHORT TERM GOAL #1   Title  Ind with initial HEP    Status  Achieved      PT SHORT TERM GOAL #2   Title  decreased left shoulder pain  with ADLS by 17%    Status  On-going        PT Long Term Goals - 10/08/18 5102      PT LONG TERM GOAL #1   Title  Patient to report decreased shoulder pain by 75% or more with ADLS.    Status  Partially Met      PT LONG TERM GOAL #2   Title  Patient able to don/doff clothing without pain in the left shoulder    Status  Partially Met      PT LONG TERM GOAL #5   Title  Patient ind with postural exercises to prevent further injury.    Status  On-going            Plan - 10/08/18 1005    Clinical Impression Statement  Pt tolerated most of the exercises well. Good AAROM with standing flexion but some L shoulder pain at end range. Only postural cues needed with seated and standing rows to keep shoulder back and head up. Positive response to MT evident by increase tissue elasticity and increase PROM.    Rehab Potential  Excellent    PT Frequency  2x / week    PT Duration  8 weeks    PT Treatment/Interventions  ADLs/Self Care Home Management;Electrical Stimulation;Cryotherapy;Iontophoresis 60m/ml Dexamethasone;Moist Heat;Therapeutic exercise;Neuromuscular re-education;Patient/family education;Dry  needling;Manual techniques    PT Next Visit Plan  continue progressive ROM / gentle strengthening exercises for Lt shoulder.        Patient will benefit from skilled therapeutic intervention in order to improve the following deficits and impairments:  Pain, Postural dysfunction, Decreased range of motion, Decreased strength, Impaired UE functional use  Visit Diagnosis: Acute pain of left shoulder  Stiffness of left shoulder, not elsewhere classified     Problem List Patient Active Problem List   Diagnosis Date Noted  . Pulmonary hypertension due to [place]boembolism ([place] 03/09/2018  . Solitary pulmonary nodule on lung CT 03/08/2018  . Ascending aortic aneurysm ([place] 01/29/2018  . Sinus bradycardia 12/22/2017  . Left bundle branch block 12/19/2017  . Personal history of pulmonary embolism 12/19/2017  . Chronic kidney disease, stage III (moderate) ([place] 12/19/2017  . Benign neoplasm of brain ([place] 12/19/2017  . Chest pain 12/04/2017    RScot Jun PTA 10/08/2018, 10:14 AM  [place] [address]: [phone]  Fax:  [phone] Name: RFabrizio FilipMRN: [id number] of Birth: [birth year]

## 2018-10-10 ENCOUNTER — Ambulatory Visit: Payer: Medicare Other | Admitting: Physical Therapy

## 2018-10-10 DIAGNOSIS — M25612 Stiffness of left shoulder, not elsewhere classified: Secondary | ICD-10-CM

## 2018-10-10 DIAGNOSIS — M25512 Pain in left shoulder: Secondary | ICD-10-CM

## 2018-10-10 NOTE — Therapy (Signed)
[address] [place], [place], [address] Phone: [phone]   Fax:  [phone]  Physical Therapy Treatment  Patient Details  Name: Timothy Moran MRN: [id number] Date of Birth: 04-Mar-1946 Referring Provider (PT): Cari Caraway   Encounter Date: 10/10/2018  PT End of Session - 10/10/18 1011    Visit Number  6    Date for PT Re-Evaluation  11/19/18    PT Start Time  0930    PT Stop Time  1030    PT Time Calculation (min)  60 min       Past Medical History:  Diagnosis Date  . Benign brain tumor ([place])   . Chronic kidney disease    Stage 3  . History of pulmonary embolus (PE)   . Hypertension     Past Surgical History:  Procedure Laterality Date  . APPENDECTOMY    . HERNIA REPAIR      There were no vitals filed for this visit.  Subjective Assessment - 10/10/18 0934    Subjective  better, pain with certain activities ( twisting) ie swept garage out and felt it. tx is helping- sleeping on shld, no more heat at night 60% better    Currently in Pain?  No/denies         [facility] PT Assessment - 10/10/18 0001      AROM   Right/Left Shoulder  Left    Left Shoulder Flexion  155 Degrees    Left Shoulder ABduction  165 Degrees    Left Shoulder Internal Rotation  70 Degrees   with pain   Left Shoulder External Rotation  90 Degrees      Strength   Left Shoulder Flexion  5/5    Left Shoulder Extension  5/5    Left Shoulder Internal Rotation  5/5    Left Shoulder External Rotation  4-/5   d/t pain                  [facility] Adult PT Treatment/Exercise - 10/10/18 0001      Shoulder Exercises: Standing   Other Standing Exercises  ER red tband 2 sets 10   weakness/pain   Other Standing Exercises  3# PNF 2 sets 10   3# 90/90 elbow shld ER 2 sets 10     Shoulder Exercises: Pulleys   Flexion  1 minute      Shoulder Exercises: ROM/Strengthening   UBE (Upper Arm Bike)  L 3 :  62fd/3back     Other ROM/Strengthening Exercises  25lb Rows & Lats 2x15      Electrical Stimulation   Electrical Stimulation Location  Lt ant/lateral shoulder    Electrical Stimulation Action  IFC    Electrical Stimulation Parameters  seated    Electrical Stimulation Goals  Pain      Manual Therapy   Manual Therapy  Passive ROM;Joint mobilization    Manual therapy comments  catch with IR/ER but full motion, very painful    Joint Mobilization  grades 3 all directions    Passive ROM  left shld               PT Short Term Goals - 10/10/18 0942      PT SHORT TERM GOAL #2   Title  decreased left shoulder pain with ADLS by 598%   Status  Achieved        PT Long Term Goals - 10/10/18 09211  PT LONG TERM GOAL #1   Title  Patient to report decreased shoulder pain by 75% or more with ADLS.    Status  On-going      PT LONG TERM GOAL #2   Title  Patient able to don/doff clothing without pain in the left shoulder    Status  Partially Met      PT LONG TERM GOAL #3   Title  Patient able to perform overhead ADLS with 1/77 pain or less in the left shoulder.    Status  Partially Met      PT LONG TERM GOAL #4   Title  Patient to demo 5/5 left shoulder strength without pain.    Baseline  all but ER weakness d/t pain    Status  Partially Met      PT LONG TERM GOAL #5   Title  Patient ind with postural exercises to prevent further injury.    Status  Achieved            Plan - 10/10/18 1012    Clinical Impression Statement  improved ROM and strength csince SOC and progressing withgoals however painful and limited rotation with catch and pain,pain with IR/ER exercises ? if pt has a tear rec calling PCP for MRI or ortho    PT Treatment/Interventions  ADLs/Self Care Home Management;Electrical Stimulation;Cryotherapy;Iontophoresis '4mg'$ /ml Dexamethasone;Moist Heat;Therapeutic exercise;Neuromuscular re-education;Patient/family education;Dry needling;Manual techniques    PT Next Visit Plan   see if called MD for MRI or follow up       Patient will benefit from skilled therapeutic intervention in order to improve the following deficits and impairments:  Pain, Postural dysfunction, Decreased range of motion, Decreased strength, Impaired UE functional use  Visit Diagnosis: Acute pain of left shoulder  Stiffness of left shoulder, not elsewhere classified     Problem List Patient Active Problem List   Diagnosis Date Noted  . Pulmonary hypertension due to thromboembolism ([place]) 03/09/2018  . Solitary pulmonary nodule on lung CT 03/08/2018  . Ascending aortic aneurysm ([place]) 01/29/2018  . Sinus bradycardia 12/22/2017  . Left bundle branch block 12/19/2017  . Personal history of pulmonary embolism 12/19/2017  . Chronic kidney disease, stage III (moderate) ([place]) 12/19/2017  . Benign neoplasm of brain ([place]) 12/19/2017  . Chest pain 12/04/2017    ,ANGIE PTA 10/10/2018, 10:14 AM  [place], [place], [address] Phone: [phone]   Fax:  [phone]  Name: Timothy Moran MRN: [id number] Date of Birth: 1946/01/11

## 2018-10-11 DIAGNOSIS — R3912 Poor urinary stream: Secondary | ICD-10-CM | POA: Diagnosis not present

## 2018-10-11 DIAGNOSIS — R3914 Feeling of incomplete bladder emptying: Secondary | ICD-10-CM | POA: Diagnosis not present

## 2018-10-17 ENCOUNTER — Other Ambulatory Visit: Payer: Self-pay | Admitting: Family Medicine

## 2018-10-17 DIAGNOSIS — M25512 Pain in left shoulder: Secondary | ICD-10-CM

## 2018-10-17 DIAGNOSIS — G8929 Other chronic pain: Secondary | ICD-10-CM

## 2018-10-19 ENCOUNTER — Ambulatory Visit
Admission: RE | Admit: 2018-10-19 | Discharge: 2018-10-19 | Disposition: A | Discharge status: Home / Self Care | Payer: Medicare Other | Source: Ambulatory Visit | Attending: Family Medicine | Admitting: Family Medicine

## 2018-10-19 DIAGNOSIS — M25512 Pain in left shoulder: Secondary | ICD-10-CM

## 2018-10-19 DIAGNOSIS — M75122 Complete rotator cuff tear or rupture of left shoulder, not specified as traumatic: Secondary | ICD-10-CM | POA: Diagnosis not present

## 2018-10-19 DIAGNOSIS — G8929 Other chronic pain: Secondary | ICD-10-CM

## 2018-10-24 DIAGNOSIS — M7542 Impingement syndrome of left shoulder: Secondary | ICD-10-CM | POA: Diagnosis not present

## 2018-10-24 DIAGNOSIS — M25512 Pain in left shoulder: Secondary | ICD-10-CM | POA: Diagnosis not present

## 2018-11-21 DIAGNOSIS — M25512 Pain in left shoulder: Secondary | ICD-10-CM | POA: Diagnosis not present

## 2018-11-21 DIAGNOSIS — M7542 Impingement syndrome of left shoulder: Secondary | ICD-10-CM | POA: Diagnosis not present

## 2018-12-13 ENCOUNTER — Other Ambulatory Visit: Payer: Self-pay | Admitting: *Deleted

## 2018-12-13 DIAGNOSIS — I712 Thoracic aortic aneurysm, without rupture, unspecified: Secondary | ICD-10-CM

## 2018-12-25 ENCOUNTER — Other Ambulatory Visit: Payer: Self-pay | Admitting: Cardiology

## 2018-12-25 MED ORDER — VALSARTAN 320 MG PO TABS: 320.0000 mg | ORAL_TABLET | Freq: Every day | ORAL | 1 refills | Status: DC

## 2018-12-25 NOTE — Telephone Encounter (Signed)
Valsartan 320 sent to [facility]

## 2018-12-25 NOTE — Telephone Encounter (Signed)
°*  STAT* If patient is at the pharmacy, call can be transferred to refill team.   1. Which medications need to be refilled? (please list name of each medication and dose if known) valsartan (DIOVAN) 320 MG    2. Which pharmacy/location (including street and city if local pharmacy) is medication to be sent to?  [place], [place]. [phone] (Phone) [phone] (Fax)    3. Do they need a 30 day or 90 day supply? 90 day

## 2018-12-31 ENCOUNTER — Other Ambulatory Visit: Payer: Self-pay

## 2018-12-31 ENCOUNTER — Encounter: Payer: Self-pay | Admitting: Cardiology

## 2018-12-31 ENCOUNTER — Telehealth: Payer: Self-pay | Admitting: Cardiology

## 2018-12-31 ENCOUNTER — Telehealth (INDEPENDENT_AMBULATORY_CARE_PROVIDER_SITE_OTHER): Payer: Medicare Other | Admitting: Cardiology

## 2018-12-31 VITALS — Wt 221.0 lb

## 2018-12-31 DIAGNOSIS — Z86711 Personal history of pulmonary embolism: Secondary | ICD-10-CM

## 2018-12-31 DIAGNOSIS — I447 Left bundle-branch block, unspecified: Secondary | ICD-10-CM | POA: Diagnosis not present

## 2018-12-31 DIAGNOSIS — I7121 Aneurysm of the ascending aorta, without rupture: Secondary | ICD-10-CM

## 2018-12-31 DIAGNOSIS — R079 Chest pain, unspecified: Secondary | ICD-10-CM

## 2018-12-31 DIAGNOSIS — I712 Thoracic aortic aneurysm, without rupture: Secondary | ICD-10-CM

## 2018-12-31 DIAGNOSIS — R001 Bradycardia, unspecified: Secondary | ICD-10-CM

## 2018-12-31 NOTE — Telephone Encounter (Signed)
° °  Cardiac Questionnaire: °  ° Since your last visit or hospitalization: ° °  1. Have you been having new or worsening chest pain? no °  2. Have you been having new or worsening shortness of breath? no °3. Have you been having new or worsening leg swelling, wt gain, or increase in abdominal girth (pants fitting more tightly)? no °  4. Have you had any passing out spells? no °   °*A YES to any of these questions would result in the appointment being kept. °*If all the answers to these questions are NO, we should indicate that given the current situation regarding the worldwide coronarvirus pandemic, at the recommendation of the CDC, we are looking to limit gatherings in our waiting area, and thus will reschedule their appointment beyond four weeks from today.   °_____________  ° °COVID-19 Pre-Screening Questions: ° °• Do you currently have a fever? no (yes = cancel and refer to pcp for e-visit) °• Have you recently travelled on a cruise, internationally, or to [place], [place], [place], [place], [place], or [place], [place] ([employer]) ? no (yes = cancel, stay home, monitor symptoms, and contact pcp or initiate e-visit if symptoms develop) °• Have you been in contact with someone that is currently pending confirmation of Covid19 testing or has been confirmed to have the Covid19 virus?  no (yes = cancel, stay home, away from tested individual, monitor symptoms, and contact pcp or initiate e-visit if symptoms develop) °• Are you currently experiencing fatigue or cough? no (yes = pt should be prepared to have a mask placed at the time of their visit). ° ° °   ° ° ° ° °

## 2018-12-31 NOTE — Patient Instructions (Signed)
Medication Instructions:  Your physician recommends that you continue on your current medications as directed. Please refer to the Current Medication list given to you today.  If you need a refill on your cardiac medications before your next appointment, please call your pharmacy.   Lab work: Your physician recommends that you return for lab work in:  [facility] BMP, LIPIDS(fasting)  If you have labs (blood work) drawn today and your tests are completely normal, you will receive your results only by: Marland Kitchen MyChart Message (if you have MyChart) OR . A paper copy in the mail If you have any lab test that is abnormal or we need to change your treatment, we will call you to review the results.  Testing/Procedures:None  Follow-Up: At [facility], you and your health needs are our priority.  As part of our continuing mission to provide you with exceptional heart care, we have created designated Provider Care Teams.  These Care Teams include your primary Cardiologist (physician) and Advanced Practice Providers (APPs -  Physician Assistants and Nurse Practitioners) who all work together to provide you with the care you need, when you need it. You will need a follow up appointment in 5 months.   Any Other Special Instructions Will Be Listed Below (If Applicable).

## 2018-12-31 NOTE — Progress Notes (Signed)
Virtual Visit via Video Note   This visit type was conducted due to national recommendations for restrictions regarding the COVID-19 Pandemic (e.g. social distancing) in an effort to limit this patient's exposure and mitigate transmission in our community.  Due to his co-morbid illnesses, this patient is at least at moderate risk for complications without adequate follow up.  This format is felt to be most appropriate for this patient at this time.  All issues noted in this document were discussed and addressed.  A limited physical exam was performed with this format.  Please refer to the patient's chart for his consent to telehealth for [facility].  Evaluation Performed:  Follow-up visit  This visit type was conducted due to national recommendations for restrictions regarding the COVID-19 Pandemic (e.g. social distancing).  This format is felt to be most appropriate for this patient at this time.  All issues noted in this document were discussed and addressed.  No physical exam was performed (except for noted visual exam findings with Video Visits).  Please refer to the patient's chart (MyChart message for video visits and phone note for telephone visits) for the patient's consent to telehealth for [facility].  Date:  12/31/2018  ID: Timothy Moran, DOB September 11, 1946, MRN [id number]   Patient Location: [place] [address]   Provider location:   [place] Office  PCP:  Via, [name], MD  Cardiologist:  Jenne Campus, MD     Chief Complaint:  Doing well  History of Present Illness:    Timothy Moran is a 73 y.o. male  who presents via audio/video conferencing for a telehealth visit today.  With history of pulmonary emboli, also ascending aortic aneurysm identified on CT, hypertension, dyslipidemia video visit today.  Overall he is doing well denies have any chest pain, tightness, squeezing, pressure, burning in the chest.  He sustained injury of  his shoulder he was scheduled to have surgery however surgery was canceled because of coronavirus situation.  He is doing some exercises at home but however he reinjured his shoulder again cardiac wise no complaints.   The patient does not have symptoms concerning for COVID-19 infection (fever, chills, cough, or new SHORTNESS OF BREATH).    Prior CV studies:   The following studies were reviewed today:  CT reviewed     Past Medical History:  Diagnosis Date  . Benign brain tumor ([place])   . Chronic kidney disease    Stage 3  . History of pulmonary embolus (PE)   . Hypertension     Past Surgical History:  Procedure Laterality Date  . APPENDECTOMY    . HERNIA REPAIR       Current Meds  Medication Sig  . aspirin EC 81 MG tablet Take 1 tablet (81 mg total) by mouth daily.  . B Complex-C (SUPER B COMPLEX PO) Take 1 tablet by mouth daily.  . cetirizine (ZYRTEC) 10 MG tablet Take 10 mg by mouth daily.  . Cholecalciferol (VITAMIN D3) 5000 units TABS Take 1 tablet by mouth daily.   . Magnesium 400 MG CAPS Take 400 mg by mouth daily.   Marland Kitchen oxymetazoline (AFRIN) 0.05 % nasal spray Place 1 spray into both nostrils daily as needed for congestion.  . tamsulosin (FLOMAX) 0.4 MG CAPS capsule Take 2 capsules by mouth daily.  . valsartan (DIOVAN) 320 MG tablet Take 1 tablet (320 mg total) by mouth daily.      Family History: The patient's family history includes  Brain cancer in his sister; Diabetes in his mother; Leukemia in his brother; Melanoma in his brother.   ROS:   Please see the history of present illness.     All other systems reviewed and are negative.   Labs/Other Tests and Data Reviewed:     Recent Labs: 03/13/2018: BUN 20; Creatinine, Ser 1.27; Potassium 4.6; Sodium 140  Recent Lipid Panel    Component Value Date/Time   CHOL 203 (H) 01/10/2018 0000   TRIG 236 (H) 01/10/2018 0000   HDL 40 01/10/2018 0000   CHOLHDL 5.1 (H) 01/10/2018 0000   LDLCALC 116 (H) 01/10/2018  0000      Exam:    Vital Signs:  Wt 221 lb (100.2 kg)   BMI 29.16 kg/m     Wt Readings from Last 3 Encounters:  12/31/18 221 lb (100.2 kg)  07/17/18 215 lb (97.5 kg)  07/02/18 219 lb 12.8 oz (99.7 kg)     Well nourished, well developed in no acute distress. I am talking to him to the video link is sitting in his living room alert awake oriented x3 without any problems there is no JVD there is no swelling of lower extremities who is quite cheerful and happy to be able to talk to me.  Diagnosis for this visit:   1. Left bundle branch block   2. Ascending aortic aneurysm ([place])   3. Sinus bradycardia   4. Personal history of pulmonary embolism      ASSESSMENT & PLAN:    1.  Left bundle branch block with diminished left ventricular ejection fraction.  Likely stress test negative for ischemia.  He is on appropriate medications which I will continue.  We had difficulty putting him on beta-blocker because of bradycardia but overall hemodynamically compensated. 2.  Ascending octagon was follow-up by CT surgeon and he is scheduled to have CT on 11 June with follow-up visit.  He asked me question if he can cut down his ARB I told him absolutely not we need to keep his blood pressure at reasonable numbers to prevent worsening of his aneurysm.  He understood that he will continue. 3.  Sinus bradycardia asymptomatic no dizziness no passing out. 4.  History of pulmonary emboli.  Echocardiogram did not show any pulmonary hypertension.  We will continue monitoring.  COVID-19 Education: The signs and symptoms of COVID-19 were discussed with the patient and how to seek care for testing (follow up with PCP or arrange E-visit).  The importance of social distancing was discussed today.  Patient Risk:   After full review of this patients clinical status, I feel that they are at least moderate risk at this time.  Time:   Today, I have spent 18 minutes with the patient with telehealth technology  discussing pt health issues.  I spent 5 minutes reviewing her chart before the visit.  Visit was finished at 10:14 AM.    Medication Adjustments/Labs and Tests Ordered: Current medicines are reviewed at length with the patient today.  Concerns regarding medicines are outlined above.  No orders of the defined types were placed in this encounter.  Medication changes: No orders of the defined types were placed in this encounter.    Disposition: Follow-up in 5 months.  He will be scheduled to have Chem-7 as well as fasting lipid profile  Signed, Park Liter, MD, [facility] 12/31/2018 10:16 AM    [place]

## 2019-01-02 DIAGNOSIS — M7542 Impingement syndrome of left shoulder: Secondary | ICD-10-CM | POA: Diagnosis not present

## 2019-01-31 ENCOUNTER — Encounter: Payer: Self-pay | Admitting: Physical Therapy

## 2019-01-31 ENCOUNTER — Other Ambulatory Visit: Payer: Self-pay

## 2019-01-31 ENCOUNTER — Ambulatory Visit: Payer: Medicare Other | Attending: Orthopedic Surgery | Admitting: Physical Therapy

## 2019-01-31 DIAGNOSIS — M25612 Stiffness of left shoulder, not elsewhere classified: Secondary | ICD-10-CM | POA: Insufficient documentation

## 2019-01-31 DIAGNOSIS — M25512 Pain in left shoulder: Secondary | ICD-10-CM

## 2019-01-31 NOTE — Therapy (Signed)
[place], [place], [address] Phone: [phone]   Fax:  [phone]  Physical Therapy Evaluation  Patient Details  Name: Timothy Moran MRN: [id number] Date of Birth: 01-20-1946 Referring Provider (PT): Jenetta Loges   Encounter Date: 01/31/2019  PT End of Session - 01/31/19 1426    Visit Number  1    Date for PT Re-Evaluation  04/02/19    PT Start Time  0947    PT Stop Time  1435    PT Time Calculation (min)  40 min    Activity Tolerance  Patient tolerated treatment well    Behavior During Therapy  [facility] for tasks assessed/performed       Past Medical History:  Diagnosis Date  . Benign brain tumor ([place])   . Chronic kidney disease    Stage 3  . History of pulmonary embolus (PE)   . Hypertension     Past Surgical History:  Procedure Laterality Date  . APPENDECTOMY    . HERNIA REPAIR      There were no vitals filed for this visit.   Subjective Assessment - 01/31/19 1359    Subjective  Patient was seen here 7 x in January for the left shoulder pain and decreased ROM.  He was going to have surgery on the left shoulder, Covid stopped that.  He reports that he was using a pole saw in February and felt a pop and pain in the left shoulder.  He reports that he has continued to have pain and some decreased ROM.  He spoke with the MD and feels like things have changed with his pain pattern from the shoulder joint in January to now in the left upper arm.    Pertinent History  benign brain tumor, left THA 2005    Patient Stated Goals  get rid of pain and get movement back    Currently in Pain?  Yes    Pain Score  0-No pain    Pain Location  Arm    Pain Orientation  Left;Upper;Lateral    Pain Descriptors / Indicators  Aching    Pain Type  Acute pain    Pain Onset  More than a month ago    Pain Frequency  Intermittent    Aggravating Factors   lying down, rest, reaching to put jacket on pain  up to 6-7/10    Pain Relieving Factors  Tylenol, pain cream activity, pain can be a 1-2/10    Effect of Pain on Daily Activities  difficulty sleeping, difficulty donning and donning shirts and jackets         [facility] PT Assessment - 01/31/19 0001      Assessment   Medical Diagnosis  left shoulder biceps tear and RC tear    Referring Provider (PT)  Olivia Mackie Shuford    Onset Date/Surgical Date  01/01/19    Hand Dominance  Right    Prior Therapy  about 3-4 months ago      Precautions   Precautions  None      Balance Screen   Has the patient fallen in the past 6 months  No    Has the patient had a decrease in activity level because of a fear of falling?   No    Is the patient reluctant to leave their home because of a fear of falling?   No      Home Environment   Additional Comments  does  yardowrk and housework      Prior Function   Level of Independence  Independent    Vocation  Retired      Mining engineer Comments  rounded shoulders and fwd head      AROM   Left Shoulder Flexion  115 Degrees    Left Shoulder ABduction  130 Degrees    Left Shoulder Internal Rotation  70 Degrees    Left Shoulder External Rotation  80 Degrees      Strength   Left Shoulder Flexion  4-/5    Left Shoulder ABduction  3+/5    Left Shoulder Internal Rotation  4+/5    Left Shoulder External Rotation  4-/5   pain     Palpation   Palpation comment  has a knot in the left upper/lateral arm      Special Tests   Other special tests  has pain with empty can test                Objective measurements completed on examination: See above findings.      [place] Adult PT Treatment/Exercise - 01/31/19 0001      Iontophoresis   Type of Iontophoresis  Dexamethasone    Location  left upper arm laterally    Dose  60mA dose    Time  4 hour patch             PT Education - 01/31/19 1425    Education Details  reviewed his HEP, my only concern was abduction and  flexion he was doing above 90 degrees I asked him to no go that high    Person(s) Educated  Patient    Methods  Explanation;Demonstration    Comprehension  Verbalized understanding       PT Short Term Goals - 01/31/19 1435      PT SHORT TERM GOAL #1   Title  Ind with initial HEP    Time  2    Period  Weeks    Status  New        PT Long Term Goals - 01/31/19 1435      PT LONG TERM GOAL #1   Title  Patient to report decreased shoulder pain by 75% or more with ADLS.    Time  8    Period  Weeks    Status  New      PT LONG TERM GOAL #2   Title  Patient able to don/doff clothing without pain in the left shoulder    Time  8    Period  Weeks    Status  New      PT LONG TERM GOAL #3   Title  Patient able to perform overhead ADLS with 1/61 pain or less in the left shoulder.    Time  8    Period  Weeks    Status  New      PT LONG TERM GOAL #4   Title  Patient to demo 5/5 left shoulder strength without pain.    Time  8    Period  Weeks    Status  New             Plan - 01/31/19 1427    Clinical Impression Statement  Patient was seen here earlier in the year for left shoulder pain.  He was scheduled for surgery but that was cancelled due to Covid.  He reports that in February he was using a pole saw  and felt a pop and pain in th eleft lateral upper arm.  The MD order speaks of small RC tear and a biceps tear.  HE lost ROM for flexion, other motions were about what they were.  He is very painful with ER resisted.  His biggest issue is trying to put on a jacket.      Stability/Clinical Decision Making  Stable/Uncomplicated    Clinical Decision Making  Low    Rehab Potential  Good    PT Frequency  2x / week    PT Duration  8 weeks    PT Treatment/Interventions  ADLs/Self Care Home Management;Electrical Stimulation;Cryotherapy;Iontophoresis 4mg /ml Dexamethasone;Moist Heat;Therapeutic exercise;Neuromuscular re-education;Patient/family education;Dry needling;Manual techniques     PT Next Visit Plan  See if we can help his ROM and his pain    Consulted and Agree with Plan of Care  Patient       Patient will benefit from skilled therapeutic intervention in order to improve the following deficits and impairments:  Pain, Postural dysfunction, Decreased range of motion, Decreased strength, Impaired UE functional use, Increased muscle spasms  Visit Diagnosis: Acute pain of left shoulder - Plan: PT plan of care cert/re-cert  Stiffness of left shoulder, not elsewhere classified - Plan: PT plan of care cert/re-cert     Problem List Patient Active Problem List   Diagnosis Date Noted  . Pulmonary hypertension due to thromboembolism ([place]) 03/09/2018  . Solitary pulmonary nodule on lung CT 03/08/2018  . Ascending aortic aneurysm ([place]) 01/29/2018  . Sinus bradycardia 12/22/2017  . Left bundle branch block 12/19/2017  . Personal history of pulmonary embolism 12/19/2017  . Chronic kidney disease, stage III (moderate) ([place]) 12/19/2017  . Benign neoplasm of brain ([place]) 12/19/2017  . Chest pain 12/04/2017    Sumner Boast., PT 01/31/2019, 2:38 PM  [place] [address] [place], [place], [address] Phone: [phone]   Fax:  [phone]  Name: Timothy Moran MRN: [id number] Date of Birth: [birth year]

## 2019-02-05 ENCOUNTER — Other Ambulatory Visit: Payer: Self-pay

## 2019-02-05 ENCOUNTER — Ambulatory Visit: Payer: Medicare Other | Admitting: Physical Therapy

## 2019-02-05 ENCOUNTER — Encounter: Payer: Self-pay | Admitting: Physical Therapy

## 2019-02-05 DIAGNOSIS — M25612 Stiffness of left shoulder, not elsewhere classified: Secondary | ICD-10-CM

## 2019-02-05 DIAGNOSIS — M25512 Pain in left shoulder: Secondary | ICD-10-CM

## 2019-02-05 NOTE — Therapy (Signed)
[address] [place], [place], [address] Phone: [phone]   Fax:  [phone]  Physical Therapy Treatment  Patient Details  Name: Timothy Moran MRN: [id number] Date of Birth: June 14, 1946 Referring Provider (PT): Jenetta Loges   Encounter Date: 02/05/2019  PT End of Session - 02/05/19 1648    Visit Number  2    Date for PT Re-Evaluation  04/02/19    PT Start Time  9407    PT Stop Time  1640    PT Time Calculation (min)  42 min       Past Medical History:  Diagnosis Date  . Benign brain tumor ([place])   . Chronic kidney disease    Stage 3  . History of pulmonary embolus (PE)   . Hypertension     Past Surgical History:  Procedure Laterality Date  . APPENDECTOMY    . HERNIA REPAIR      There were no vitals filed for this visit.  Subjective Assessment - 02/05/19 1606    Subjective  Pt reports no change since evaluation    Currently in Pain?  No/denies                       [facility] Adult PT Treatment/Exercise - 02/05/19 0001      Shoulder Exercises: Standing   External Rotation  Theraband;20 reps;Both;Strengthening    Theraband Level (Shoulder External Rotation)  Level 1 (Yellow)    Internal Rotation  Theraband;15 reps;Left   x2   Flexion  Weights;20 reps;Both    Shoulder Flexion Weight (lbs)  2    ABduction  20 reps;Both;Weights    Shoulder ABduction Weight (lbs)  2    Extension  Theraband;15 reps;Both   x2   Theraband Level (Shoulder Extension)  Level 2 (Red)    Row  Theraband;15 reps;Both   x2   Theraband Level (Shoulder Row)  Level 2 (Red)    Other Standing Exercises  triceps ext 25lb 2x10    Other Standing Exercises  biceps curls 15lb 2x10       Shoulder Exercises: ROM/Strengthening   UBE (Upper Arm Bike)  L1.5 3 min each       Iontophoresis   Type of Iontophoresis  Dexamethasone    Location  left upper arm laterally    Dose  24m dose    Time  4 hour patch       Manual Therapy   Manual Therapy  Passive ROM;Joint mobilization    Manual therapy comments  Full L shoulder ER/IR    Joint Mobilization  Inferior L GH glides grades 2-3    Passive ROM  L shoulder all Directions               PT Short Term Goals - 02/05/19 1649      PT SHORT TERM GOAL #1   Title  Ind with initial HEP    Status  Partially Met        PT Long Term Goals - 01/31/19 1435      PT LONG TERM GOAL #1   Title  Patient to report decreased shoulder pain by 75% or more with ADLS.    Time  8    Period  Weeks    Status  New      PT LONG TERM GOAL #2   Title  Patient able to don/doff clothing without pain in the left shoulder    Time  8    Period  Weeks    Status  New      PT LONG TERM GOAL #3   Title  Patient able to perform overhead ADLS with 4/23 pain or less in the left shoulder.    Time  8    Period  Weeks    Status  New      PT LONG TERM GOAL #4   Title  Patient to demo 5/5 left shoulder strength without pain.    Time  8    Period  Weeks    Status  New            Plan - 02/05/19 1649    Clinical Impression Statement  Pt tolerated an initial  progression to TE well. He did have some L shoulder elevation with flexion and abduction with some pain at end range. Postural compensation noted with external rotation. Some guarding with MT pain at end range of flex, scap, and abduction. Pt had full L shoulder internal and external rotation.     Rehab Potential  Good    PT Frequency  2x / week    PT Duration  8 weeks    PT Treatment/Interventions  ADLs/Self Care Home Management;Electrical Stimulation;Cryotherapy;Iontophoresis 38m/ml Dexamethasone;Moist Heat;Therapeutic exercise;Neuromuscular re-education;Patient/family education;Dry needling;Manual techniques    PT Next Visit Plan  Try to progress with ROM       Patient will benefit from skilled therapeutic intervention in order to improve the following deficits and impairments:  Pain, Postural  dysfunction, Decreased range of motion, Decreased strength, Impaired UE functional use, Increased muscle spasms  Visit Diagnosis: Stiffness of left shoulder, not elsewhere classified  Acute pain of left shoulder     Problem List Patient Active Problem List   Diagnosis Date Noted  . Pulmonary hypertension due to thromboembolism ([place] 03/09/2018  . Solitary pulmonary nodule on lung CT 03/08/2018  . Ascending aortic aneurysm ([place] 01/29/2018  . Sinus bradycardia 12/22/2017  . Left bundle branch block 12/19/2017  . Personal history of pulmonary embolism 12/19/2017  . Chronic kidney disease, stage III (moderate) ([place] 12/19/2017  . Benign [place] of brain [place] 12/19/2017  . Chest pain 12/04/2017    RScot Jun PTA 02/05/2019, 4:55 PM  [address] [place] [address]Phone: [phone]  Fax:  [phone] Name: Timothy QuintoMRN: [id number] of Birth: [birth year]

## 2019-02-12 ENCOUNTER — Other Ambulatory Visit: Payer: Self-pay

## 2019-02-12 ENCOUNTER — Encounter: Payer: Self-pay | Admitting: Physical Therapy

## 2019-02-12 ENCOUNTER — Ambulatory Visit: Payer: Medicare Other | Attending: Orthopedic Surgery | Admitting: Physical Therapy

## 2019-02-12 DIAGNOSIS — M25612 Stiffness of left shoulder, not elsewhere classified: Secondary | ICD-10-CM

## 2019-02-12 DIAGNOSIS — M25512 Pain in left shoulder: Secondary | ICD-10-CM | POA: Diagnosis not present

## 2019-02-12 NOTE — Therapy (Signed)
[address] [place], [place], [address] Phone: [phone]   Fax:  [phone]  Physical Therapy Treatment  Patient Details  Name: Jasyn Mey MRN: [id number] Date of Birth: 1946-08-30 Referring Provider (PT): Jenetta Loges   Encounter Date: 02/12/2019  PT End of Session - 02/12/19 5701    Visit Number  3    Date for PT Re-Evaluation  04/02/19    PT Start Time  7793    PT Stop Time  1100    PT Time Calculation (min)  45 min    Activity Tolerance  Patient tolerated treatment well    Behavior During Therapy  [facility] for tasks assessed/performed       Past Medical History:  Diagnosis Date  . Benign brain tumor ([place])   . Chronic kidney disease    Stage 3  . History of pulmonary embolus (PE)   . Hypertension     Past Surgical History:  Procedure Laterality Date  . APPENDECTOMY    . HERNIA REPAIR      There were no vitals filed for this visit.  Subjective Assessment - 02/12/19 1017    Subjective  Patient reports that he feels like it is improving, he is waking up lying on the left side which he was not able to, he also reports doing yardwork yesterday with no increase of pain, just taking Advil    Currently in Pain?  Yes    Pain Score  2     Pain Location  Arm    Pain Orientation  Left;Upper    Aggravating Factors   lying on the left side, twisting motions                       [facility] Adult PT Treatment/Exercise - 02/12/19 0001      Shoulder Exercises: Standing   External Rotation  Theraband;20 reps;Both;Strengthening    Theraband Level (Shoulder External Rotation)  Level 2 (Red)    Internal Rotation  Theraband;15 reps;Left    Theraband Level (Shoulder Internal Rotation)  Level 2 (Red)    Extension  Both;20 reps;Weights    Extension Weight (lbs)  10    Row  Both;20 reps;Weights    Row Weight (lbs)  10    Other Standing Exercises  triceps ext 25lb 2x10    Other Standing  Exercises  biceps curls 15lb 2x10 , some weighted ball throwing, this was difficult for him      Shoulder Exercises: ROM/Strengthening   UBE (Upper Arm Bike)  L4 3 min each     Other ROM/Strengthening Exercises  seated row 25#, lats 25# 2x10, chest press 15# 2x10      Iontophoresis   Type of Iontophoresis  Dexamethasone    Location  left upper arm laterally    Dose  54m dose    Time  4 hour patch      Manual Therapy   Manual Therapy  Passive ROM;Joint mobilization    Passive ROM  L shoulder all Directions focus on ER/IR and horizontal adduction               PT Short Term Goals - 02/05/19 1649      PT SHORT TERM GOAL #1   Title  Ind with initial HEP    Status  Partially Met        PT Long Term Goals - 01/31/19 1435      PT LONG TERM  GOAL #1   Title  Patient to report decreased shoulder pain by 75% or more with ADLS.    Time  8    Period  Weeks    Status  New      PT LONG TERM GOAL #2   Title  Patient able to don/doff clothing without pain in the left shoulder    Time  8    Period  Weeks    Status  New      PT LONG TERM GOAL #3   Title  Patient able to perform overhead ADLS with 0/10 pain or less in the left shoulder.    Time  8    Period  Weeks    Status  New      PT LONG TERM GOAL #4   Title  Patient to demo 5/5 left shoulder strength without pain.    Time  8    Period  Weeks    Status  New            Plan - 02/12/19 1241    Clinical Impression Statement  Patient reports that he feels like he is having some less pain, reports that he has been sleeping on the left side which he was not able to do this due to pain.  Reports a little less pain with dressing.  He is very tender in the lateral left upper arm    PT Next Visit Plan  Try to progress with ROM    Consulted and Agree with Plan of Care  Patient       Patient will benefit from skilled therapeutic intervention in order to improve the following deficits and impairments:  Pain, Postural  dysfunction, Decreased range of motion, Decreased strength, Impaired UE functional use, Increased muscle spasms  Visit Diagnosis: Stiffness of left shoulder, not elsewhere classified  Acute pain of left shoulder     Problem List Patient Active Problem List   Diagnosis Date Noted  . Pulmonary hypertension due to thromboembolism ([place]) 03/09/2018  . Solitary pulmonary nodule on lung CT 03/08/2018  . Ascending aortic aneurysm ([place]) 01/29/2018  . Sinus bradycardia 12/22/2017  . Left bundle branch block 12/19/2017  . Personal history of pulmonary embolism 12/19/2017  . Chronic kidney disease, stage III (moderate) ([place]) 12/19/2017  . Benign neoplasm of brain ([place]) 12/19/2017  . Chest pain 12/04/2017    Sumner Boast., PT 02/12/2019, 12:43 PM  [address] [place], [place], [address] Phone: [phone]   Fax:  [phone]  Name: Harvest Stanco MRN: [id number] Date of Birth: August 18, 1946

## 2019-02-19 ENCOUNTER — Ambulatory Visit: Payer: Medicare Other | Admitting: Physical Therapy

## 2019-02-19 ENCOUNTER — Encounter: Payer: Self-pay | Admitting: Physical Therapy

## 2019-02-19 ENCOUNTER — Other Ambulatory Visit: Payer: Self-pay

## 2019-02-19 DIAGNOSIS — M25612 Stiffness of left shoulder, not elsewhere classified: Secondary | ICD-10-CM | POA: Diagnosis not present

## 2019-02-19 DIAGNOSIS — M25512 Pain in left shoulder: Secondary | ICD-10-CM

## 2019-02-19 NOTE — Therapy (Signed)
[address] [place], [place], [address] Phone: [phone]   Fax:  [phone]  Physical Therapy Treatment  Patient Details  Name: Colon Rueth MRN: [id number] Date of Birth: October 15, 1945 Referring Provider (PT): Jenetta Loges   Encounter Date: 02/19/2019  PT End of Session - 02/19/19 1003    Visit Number  4    Date for PT Re-Evaluation  04/02/19    PT Start Time  0923    PT Stop Time  1018    PT Time Calculation (min)  55 min    Activity Tolerance  Patient tolerated treatment well    Behavior During Therapy  [facility] for tasks assessed/performed       Past Medical History:  Diagnosis Date  . Benign brain tumor ([place])   . Chronic kidney disease    Stage 3  . History of pulmonary embolus (PE)   . Hypertension     Past Surgical History:  Procedure Laterality Date  . APPENDECTOMY    . HERNIA REPAIR      There were no vitals filed for this visit.  Subjective Assessment - 02/19/19 0927    Subjective  Patient continues to report improvement, still reports difficulty sleeping and with any twisting motions    Currently in Pain?  Yes    Pain Score  3     Pain Location  Arm    Pain Orientation  Left;Upper    Pain Descriptors / Indicators  Aching    Aggravating Factors   twisting, trying to sleep                       [facility] Adult PT Treatment/Exercise - 02/19/19 0001      Shoulder Exercises: Standing   Extension  Both;20 reps;Weights    Extension Weight (lbs)  10    Row  Both;20 reps;Weights    Row Weight (lbs)  10    Other Standing Exercises  triceps ext 25lb 2x10    Other Standing Exercises  biceps curls 15lb 2x10 , some weighted ball throwing, this was difficult for him, overhead hang for end ROM, wand behind back extension with PT overpressure      Shoulder Exercises: ROM/Strengthening   UBE (Upper Arm Bike)  L4 3 min each     Other ROM/Strengthening Exercises  seated row  25#, lats 25# 2x10, chest press 15# 2x10      Moist Heat Therapy   Number Minutes Moist Heat  15 Minutes    Moist Heat Location  Shoulder      Electrical Stimulation   Electrical Stimulation Location  left shoulder    Electrical Stimulation Action  IFC    Electrical Stimulation Parameters  sitting    Electrical Stimulation Goals  Pain      Manual Therapy   Manual Therapy  Passive ROM;Joint mobilization    Passive ROM  L shoulder all Directions focus on ER/IR and horizontal adduction, did some contract relax at end ranges             PT Education - 02/19/19 1003    Education Details  added green tband    Person(s) Educated  Patient    Comprehension  Verbalized understanding       PT Short Term Goals - 02/05/19 1649      PT SHORT TERM GOAL #1   Title  Ind with initial HEP    Status  Partially Met  PT Long Term Goals - 02/19/19 1005      PT LONG TERM GOAL #1   Title  Patient to report decreased shoulder pain by 75% or more with ADLS.    Status  Partially Met      PT LONG TERM GOAL #2   Title  Patient able to don/doff clothing without pain in the left shoulder    Status  On-going      PT LONG TERM GOAL #3   Title  Patient able to perform overhead ADLS with 5/91 pain or less in the left shoulder.    Status  On-going            Plan - 02/19/19 1003    Clinical Impression Statement  Patient with reports of doing "much better", he still has pain at end ranges and with small ER/IR type motions, tried more contract relax at end ranges today, this did cause some increased soreness and pain at the end of the treatment soe we did modalities at the end    PT Next Visit Plan  work on ER/IR strength    Consulted and Agree with Plan of Care  Patient       Patient will benefit from skilled therapeutic intervention in order to improve the following deficits and impairments:  Pain, Postural dysfunction, Decreased range of motion, Decreased strength, Impaired UE  functional use, Increased muscle spasms  Visit Diagnosis: Stiffness of left shoulder, not elsewhere classified  Acute pain of left shoulder     Problem List Patient Active Problem List   Diagnosis Date Noted  . Pulmonary hypertension due to thromboembolism ([place]) 03/09/2018  . Solitary pulmonary nodule on lung CT 03/08/2018  . Ascending aortic aneurysm ([place]) 01/29/2018  . Sinus bradycardia 12/22/2017  . Left bundle branch block 12/19/2017  . Personal history of pulmonary embolism 12/19/2017  . Chronic kidney disease, stage III (moderate) ([place]) 12/19/2017  . Benign neoplasm of brain ([place]) 12/19/2017  . Chest pain 12/04/2017    Sumner Boast., PT 02/19/2019, 10:05 AM  [address] [place], [place], [address] Phone: [phone]   Fax:  [phone]  Name: Rajah Tagliaferro MRN: [id number] Date of Birth: 12/05/1945

## 2019-02-21 ENCOUNTER — Ambulatory Visit
Admission: RE | Admit: 2019-02-21 | Discharge: 2019-02-21 | Disposition: A | Discharge status: Home / Self Care | Payer: Medicare Other | Source: Ambulatory Visit | Attending: Thoracic Surgery (Cardiothoracic Vascular Surgery) | Admitting: Thoracic Surgery (Cardiothoracic Vascular Surgery)

## 2019-02-21 DIAGNOSIS — I712 Thoracic aortic aneurysm, without rupture, unspecified: Secondary | ICD-10-CM

## 2019-02-21 MED ORDER — IOPAMIDOL (ISOVUE-370) INJECTION 76%
75.0000 mL | Freq: Once | INTRAVENOUS | Status: AC | PRN
  Administered 2019-02-21: 75 mL via INTRAVENOUS

## 2019-02-25 ENCOUNTER — Other Ambulatory Visit: Payer: Self-pay

## 2019-02-26 ENCOUNTER — Other Ambulatory Visit: Payer: Self-pay | Admitting: *Deleted

## 2019-02-26 ENCOUNTER — Ambulatory Visit (INDEPENDENT_AMBULATORY_CARE_PROVIDER_SITE_OTHER): Payer: Medicare Other | Admitting: Thoracic Surgery (Cardiothoracic Vascular Surgery)

## 2019-02-26 ENCOUNTER — Encounter: Payer: Self-pay | Admitting: Thoracic Surgery (Cardiothoracic Vascular Surgery)

## 2019-02-26 VITALS — BP 129/83 | HR 70 | Temp 87.0°F | Resp 16 | Ht 72.0 in | Wt 224.0 lb

## 2019-02-26 DIAGNOSIS — I712 Thoracic aortic aneurysm, without rupture, unspecified: Secondary | ICD-10-CM

## 2019-02-26 DIAGNOSIS — M899 Disorder of bone, unspecified: Secondary | ICD-10-CM

## 2019-02-26 NOTE — Progress Notes (Unsigned)
mri

## 2019-02-26 NOTE — Progress Notes (Signed)
[address]       [place] [address]             [phone]     HPI: Timothy Moran returns for follow-up of his ascending aorta and lung nodules  Timothy Moran is a 73 year old gentleman with a past history of hypertension, pulmonary embolus, benign brain tumor, stage III chronic kidney disease, lung nodules, left bundle branch block, and ascending aortic ectasia.  He was evaluated for chest pain in March 2019.  A CT angios showed a 10 x 8 x 7 mm nodule in the right upper lobe.  There also was a nodule along the diaphragm in the lower lobe.  A follow-up CT showed no significant change in size.  On PET CT there was mild uptake in the upper lobe nodule with an SUV of 1.7.  There was no uptake in the lower lobe nodule.  I saw him in July 2019.  We discussed attempting biopsy or surgical resection versus radiographic follow-up.  [place] had [place] trip planned to [place] and opted for radiographic follow-up.  I saw him back in November and the nodules were unchanged.  His CT report did note lesion in the spinal canal but he refused any further work-up at that time.  He is has been feeling well.  He is not having any chest pain, pressure, tightness, or shortness of breath.  He has not had any weakness or back pain.  Past Medical History:  Diagnosis Date  . Benign brain tumor (Timothy Moran)   . Chronic kidney disease    Stage 3  . History of pulmonary embolus (PE)   . Hypertension     Current Outpatient Medications  Medication Sig Dispense Refill  . aspirin EC 81 MG tablet Take 1 tablet (81 mg total) by mouth daily. 90 tablet 3  . B Complex-C (SUPER B COMPLEX PO) Take 1 tablet by mouth daily.    . cetirizine (ZYRTEC) 10 MG tablet Take 10 mg by mouth daily.    . Cholecalciferol (VITAMIN D3) 5000 units TABS Take 1 tablet by mouth daily.     . Magnesium 400 MG CAPS Take 400 mg by mouth daily.     Marland Kitchen oxymetazoline (AFRIN) 0.05 % nasal spray Place 1 spray into both nostrils daily as  needed for congestion.    . tamsulosin (FLOMAX) 0.4 MG CAPS capsule Take 2 capsules by mouth daily.    . valsartan (DIOVAN) 320 MG tablet Take 1 tablet (320 mg total) by mouth daily. 60 tablet 1   No current facility-administered medications for this visit.     Physical Exam BP 129/83 (BP Location: Right Arm, Patient Position: Sitting, Cuff Size: Normal)   Pulse 70   Temp (!) 87 F (30.6 C) (Other (Comment)) Comment (Src): thermal  Resp 16   Ht 6' (1.829 m)   Wt 224 lb (101.6 kg)   SpO2 93% Comment: ON RA  BMI 30.53 kg/m  73 year old man in no acute distress, wearing surgical mask Alert and oriented x3 with no focal deficits No carotid bruits Cardiac regular rate and rhythm normal S1 and S2 Lungs clear with equal breath sounds bilaterally No peripheral edema  Diagnostic Tests: CT ANGIOGRAPHY CHEST WITH CONTRAST  TECHNIQUE: Multidetector CT imaging of the chest was performed using the standard protocol during bolus administration of intravenous contrast. Multiplanar CT image reconstructions and MIPs were obtained to evaluate the vascular anatomy.  Creatinine was obtained on site at [facility]  at [address]  Results: Creatinine 1.4 mg/dL.  GFR 49.  CONTRAST:  48mL ISOVUE-370 IOPAMIDOL (ISOVUE-370) INJECTION 76%  COMPARISON:  07/03/2018 and 02/13/2018, PET-CT 03/13/2018  FINDINGS: Cardiovascular: Mild cardiomegaly. Minimal calcified plaque over the left main and left anterior descending as well as lateral circumflex coronary arteries unchanged. Mild aneurysmal dilatation of the proximal and mid ascending thoracic aorta measuring 4.1 cm in AP diameter unchanged. Pulmonary arterial system is within normal with stable small with at a right lower lobar pulmonary artery bifurcation.  Mediastinum/Nodes: No mediastinal or hilar adenopathy. Remaining mediastinal structures are unremarkable.  Lungs/Pleura: Lungs are adequately inflated without  lobar consolidation or effusion. Stable 6 x 12 mm nodule over the right upper lobe and stable 0.7 x 2 cm nodule over the subpleural location of the right lower lobe. No new nodules. Airways are normal. Minimal atelectasis/scarring over the lingula.  Upper Abdomen: No acute findings.  Musculoskeletal: Degenerative change of the spine. Stable sclerotic focus over the left side of T9 vertebral body. There is a stable hyperdense mass over the right side of the spinal canal at the approximate T8 level measuring 6 x 9 mm.  Review of the MIP images confirms the above findings.  IMPRESSION: 1. Stable aneurysmal dilatation of the ascending thoracic aorta measuring 4.1 cm. Recommend annual imaging followup by CTA or MRA. This recommendation follows 2010 ACCF/AHA/AATS/ACR/ASA/SCA/SCAI/SIR/STS/SVM Guidelines for the Diagnosis and Management of Patients with Thoracic Aortic Disease. Circulation. 2010; 121: M086-P619. Aortic aneurysm NOS (ICD10-I71.9).  2. Stable 1.2 cm right upper lobe nodule and stable 2 cm peripheral right lower lobe nodule. These were previous evaluated by PET-CT as the upper lobe nodule has low level activity and remain slightly suspicious for low-grade adenocarcinoma. The lower lobe nodule had no significant metabolic activity.  3.  Atherosclerotic coronary artery disease.  Mild cardiomegaly.  4. Stable 9 mm hyperdense mass over the right side of the spinal canal at the T8 level. Continue to recommend MRI with without contrast for better characterization.  5. Stable sclerotic focus of T9 which is nonspecific and may represent a bone island versus metastatic disease. Recommend clinical correlation.   Electronically Signed   By: Marin Olp M.D.   On: 02/21/2019 11:33 I personally reviewed the CT images and concur with the findings noted above.  Impression: Timothy Moran is a 73 year old man with a history ofhistory of hypertension, pulmonary  emboli, benign brain tumor, stage III chronic kidney disease, lung nodules, left bundle branch block, and ascending aortic ectasia.  He was first found to have lung nodules a little over a year ago on CT of the chest.  On PET CT the upper lobe nodule was mildly hypermetabolic.  There was no activity in the lower lobe nodule.  He also was noted to have ascending aortic ectasia.  Lung nodules-stable dating back to March 2019.  Needs continued follow-up particularly in regards to the upper lobe nodule which was mildly hypermetabolic.  This still could turn out to be a low-grade adenocarcinoma.  He is not interested in a more aggressive approach to that nodule the present time.  Ascending aortic ectasia-stable at 4.1 cm.  Will need continued annual follow-up.  Spinal canal mass-noted previously on CT.  Stable today.  He refused further work-up of that at his last visit.  I was insistent that he get additional work-up of this area with an MR with and without contrast and neurosurgery evaluation.  He is amenable to that at this time.  Plan:  MR thoracic and lumbar spine with and without contrast Referral to neurosurgery I will plan to see him back in 6 months with CT of the chest  Melrose Nakayama, MD [facility] [phone]

## 2019-02-27 ENCOUNTER — Ambulatory Visit: Payer: Medicare Other | Admitting: Physical Therapy

## 2019-02-27 ENCOUNTER — Encounter: Payer: Self-pay | Admitting: Physical Therapy

## 2019-02-27 ENCOUNTER — Other Ambulatory Visit: Payer: Self-pay

## 2019-02-27 DIAGNOSIS — M25612 Stiffness of left shoulder, not elsewhere classified: Secondary | ICD-10-CM | POA: Diagnosis not present

## 2019-02-27 DIAGNOSIS — M25512 Pain in left shoulder: Secondary | ICD-10-CM

## 2019-02-27 NOTE — Therapy (Signed)
[place] [address] [place], [place], [address] Phone: [phone]   Fax:  [phone]  Physical Therapy Treatment  Patient Details  Name: Timothy Moran MRN: [id number] Date of Birth: [birth year] Referring Provider (PT): Jenetta Loges   Encounter Date: 02/27/2019  PT End of Session - 02/27/19 1050    Visit Number  5    Date for PT Re-Evaluation  04/02/19    PT Start Time  1010    PT Stop Time  1050    PT Time Calculation (min)  40 min    Activity Tolerance  Patient tolerated treatment well    Behavior During Therapy  [facility] for tasks assessed/performed       Past Medical History:  Diagnosis Date  . Benign brain tumor ([place])   . Chronic kidney disease    Stage 3  . History of pulmonary embolus (PE)   . Hypertension     Past Surgical History:  Procedure Laterality Date  . APPENDECTOMY    . HERNIA REPAIR      There were no vitals filed for this visit.  Subjective Assessment - 02/27/19 1016    Subjective  Patient reports that he still feels like he is getting better    Currently in Pain?  Yes    Pain Score  1     Pain Location  Arm    Pain Orientation  Left;Upper    Pain Descriptors / Indicators  Aching;Sore    Pain Relieving Factors  the treatment seems to be helping         [facility] PT Assessment - 02/27/19 0001      AROM   Left Shoulder Flexion  132 Degrees    Left Shoulder ABduction  142 Degrees    Left Shoulder Internal Rotation  70 Degrees    Left Shoulder External Rotation  80 Degrees                   [facility] Adult PT Treatment/Exercise - 02/27/19 0001      Shoulder Exercises: Standing   External Rotation  Theraband;20 reps;Both;Strengthening    Theraband Level (Shoulder External Rotation)  Level 3 (Green)    Other Standing Exercises  triceps ext 35lb 2x10    Other Standing Exercises  biceps curls 15lb 2x10 , some weighted ball throwing, this was difficult for him, overhead  hang for end ROM, wand behind back extension with PT overpressure      Shoulder Exercises: ROM/Strengthening   UBE (Upper Arm Bike)  L4 3 min each     Other ROM/Strengthening Exercises  seated row 25#, lats 25# 2x10, chest press 15# 2x10      Iontophoresis   Type of Iontophoresis  Dexamethasone    Location  left upper arm laterally    Dose  65m dose    Time  4 hour patch      Manual Therapy   Manual Therapy  Passive ROM;Joint mobilization    Joint Mobilization  all GH joint glides    Passive ROM  L shoulder all Directions focus on ER/IR and horizontal adduction, did some contract relax at end ranges               PT Short Term Goals - 02/05/19 1649      PT SHORT TERM GOAL #1   Title  Ind with initial HEP    Status  Partially Met        PT Long  Term Goals - 02/27/19 1056      PT LONG TERM GOAL #1   Title  Patient to report decreased shoulder pain by 75% or more with ADLS.    Status  Partially Met      PT LONG TERM GOAL #2   Title  Patient able to don/doff clothing without pain in the left shoulder    Status  Partially Met            Plan - 02/27/19 1051    Clinical Impression Statement  Patient continues to report improvement, his ROM is much better functionally and he reports no difficlty with putting on a jacket.  He continues to have pain with the doorway stretch    PT Next Visit Plan  continue 1x/week and see if he gets to the point he feels he can do on his own    Consulted and Agree with Plan of Care  Patient       Patient will benefit from skilled therapeutic intervention in order to improve the following deficits and impairments:  Pain, Postural dysfunction, Decreased range of motion, Decreased strength, Impaired UE functional use, Increased muscle spasms  Visit Diagnosis: 1. Stiffness of left shoulder, not elsewhere classified   2. Acute pain of left shoulder        Problem List Patient Active Problem List   Diagnosis Date Noted  .  Pulmonary hypertension due to thromboembolism ([place]) 03/09/2018  . Solitary pulmonary nodule on lung CT 03/08/2018  . Ascending aortic aneurysm ([place]) 01/29/2018  . Sinus bradycardia 12/22/2017  . Left bundle branch block 12/19/2017  . Personal history of pulmonary embolism 12/19/2017  . Chronic kidney disease, stage III (moderate) ([place]) 12/19/2017  . Benign neoplasm of brain ([place]) 12/19/2017  . Chest pain 12/04/2017    Sumner Boast., PT 02/27/2019, 10:57 AM  [address] [place], [place], [address] Phone: [phone]   Fax:  [phone]  Name: Timothy Moran MRN: [id number] Date of Birth: 04/10/1946

## 2019-03-06 ENCOUNTER — Ambulatory Visit: Payer: Medicare Other | Admitting: Physical Therapy

## 2019-03-06 ENCOUNTER — Encounter: Payer: Self-pay | Admitting: Physical Therapy

## 2019-03-06 ENCOUNTER — Other Ambulatory Visit: Payer: Self-pay

## 2019-03-06 DIAGNOSIS — M25512 Pain in left shoulder: Secondary | ICD-10-CM | POA: Diagnosis not present

## 2019-03-06 DIAGNOSIS — M25612 Stiffness of left shoulder, not elsewhere classified: Secondary | ICD-10-CM

## 2019-03-06 NOTE — Therapy (Signed)
[place] [address] [place], [place], [address] Phone: [phone]   Fax:  [phone]  Physical Therapy Treatment  Patient Details  Name: Timothy Moran MRN: [id number] Date of Birth: 1946/05/03 Referring Provider (PT): Jenetta Loges   Encounter Date: 03/06/2019  PT End of Session - 03/06/19 1201    Visit Number  6    Date for PT Re-Evaluation  04/02/19    PT Start Time  1012    PT Stop Time  1155    PT Time Calculation (min)  103 min    Activity Tolerance  Patient tolerated treatment well    Behavior During Therapy  [facility] for tasks assessed/performed       Past Medical History:  Diagnosis Date  . Benign brain tumor ([place])   . Chronic kidney disease    Stage 3  . History of pulmonary embolus (PE)   . Hypertension     Past Surgical History:  Procedure Laterality Date  . APPENDECTOMY    . HERNIA REPAIR      There were no vitals filed for this visit.  Subjective Assessment - 03/06/19 1013    Subjective  Patient reports that he has been focusing on doing better with exercises and feels he is getting much better    Currently in Pain?  Yes    Pain Score  1     Pain Location  Shoulder    Pain Orientation  Left    Aggravating Factors   reaching up, sleeping                       [facility] Adult PT Treatment/Exercise - 03/06/19 0001      Shoulder Exercises: Standing   Flexion  AAROM;10 reps    Shoulder Flexion Weight (lbs)  wall slides with stretch at top    ABduction  AAROM;20 reps    ABduction Limitations  wall slides with stretch at the top    Other Standing Exercises  cabinet reaching with 3 and 4# flexion and abduction      Shoulder Exercises: ROM/Strengthening   UBE (Upper Arm Bike)  L4 5 min each     Other ROM/Strengthening Exercises  seated row 35#, lats 35# 2x10, chest press 15# 2x10      Iontophoresis   Type of Iontophoresis  Dexamethasone    Location  left upper arm  laterally    Dose  64m dose    Time  4 hour patch      Manual Therapy   Manual Therapy  Passive ROM;Joint mobilization    Joint Mobilization  all GH joint glides    Passive ROM  L shoulder all Directions focus on ER/IR and horizontal adduction, did some contract relax at end ranges               PT Short Term Goals - 02/05/19 1649      PT SHORT TERM GOAL #1   Title  Ind with initial HEP    Status  Partially Met        PT Long Term Goals - 03/06/19 1203      PT LONG TERM GOAL #1   Title  Patient to report decreased shoulder pain by 75% or more with ADLS.    Status  Achieved      PT LONG TERM GOAL #2   Title  Patient able to don/doff clothing without pain in the left shoulder  PT LONG TERM GOAL #3   Title  Patient able to perform overhead ADLS with 3/55 pain or less in the left shoulder.    Status  Partially Met      PT LONG TERM GOAL #4   Title  Patient to demo 5/5 left shoulder strength without pain.    Status  Partially Met            Plan - 03/06/19 1201    Clinical Impression Statement  Patient with increase ROM, doing better with his pain and his sleep, reports that he is still taking anti-inflammatory prior to bed.  I gave him some new exercises for home and we discussed his progress    PT Next Visit Plan  he is to try this next week on his own, continue with the HEP and he is going to try to decrease the medication, if no issues we will d/c, he is to call if he has issues    Consulted and Agree with Plan of Care  Patient       Patient will benefit from skilled therapeutic intervention in order to improve the following deficits and impairments:  Pain, Postural dysfunction, Decreased range of motion, Decreased strength, Impaired UE functional use, Increased muscle spasms  Visit Diagnosis: 1. Stiffness of left shoulder, not elsewhere classified   2. Acute pain of left shoulder        Problem List Patient Active Problem List   Diagnosis  Date Noted  . Pulmonary hypertension due to thromboembolism ([place]) 03/09/2018  . Solitary pulmonary nodule on lung CT 03/08/2018  . Ascending aortic aneurysm ([place]) 01/29/2018  . Sinus bradycardia 12/22/2017  . Left bundle branch block 12/19/2017  . Personal history of pulmonary embolism 12/19/2017  . Chronic kidney disease, stage III (moderate) ([place]) 12/19/2017  . Benign neoplasm of brain ([place]) 12/19/2017  . Chest pain 12/04/2017    Sumner Boast., PT 03/06/2019, 12:04 PM  [place] [address] [place], [place], [address] Phone: [phone]   Fax:  [phone]  Name: Zakariyah Freimark MRN: [id number] Date of Birth: 12-15-1945

## 2019-03-20 ENCOUNTER — Ambulatory Visit
Admission: RE | Admit: 2019-03-20 | Discharge: 2019-03-20 | Disposition: A | Discharge status: Home / Self Care | Payer: Medicare Other | Source: Ambulatory Visit | Attending: Thoracic Surgery (Cardiothoracic Vascular Surgery) | Admitting: Thoracic Surgery (Cardiothoracic Vascular Surgery)

## 2019-03-20 ENCOUNTER — Other Ambulatory Visit: Payer: Self-pay

## 2019-03-20 DIAGNOSIS — G959 Disease of spinal cord, unspecified: Secondary | ICD-10-CM | POA: Diagnosis not present

## 2019-03-20 DIAGNOSIS — M899 Disorder of bone, unspecified: Secondary | ICD-10-CM

## 2019-03-20 MED ORDER — GADOBENATE DIMEGLUMINE 529 MG/ML IV SOLN
20.0000 mL | Freq: Once | INTRAVENOUS | Status: AC | PRN
  Administered 2019-03-20: 20 mL via INTRAVENOUS

## 2019-03-21 ENCOUNTER — Telehealth: Payer: Self-pay | Admitting: Thoracic Surgery (Cardiothoracic Vascular Surgery)

## 2019-03-21 NOTE — Telephone Encounter (Signed)
      [address]       [place] [address]             [phone]      I called Mr. Lord to update him on MR report.  IMPRESSION: 1. Homogeneously enhancing 12 x 9 x 22 mm intradural extramedullary mass occupying the right half the spinal canal at the lower T8 level. This is most compatible with Thoracic Meningioma. There is associated spinal stenosis and cord compression, but no abnormal cord signal. 2. Probable benign hemangioma with atypical T1 signal in the left posterior T9 vertebral body which appears unrelated. 3. Mild for age thoracic spine degeneration.   Electronically Signed   By: Genevie Ann M.D.   On: 03/21/2019 08:21  Initially very resistant but ultimately agreed to Neurosurgery evaluation.  Will make referral to Dr. Quentin Cornwall C. Roxan Hockey, MD [facility] [phone]

## 2019-03-30 IMAGING — CT CT ANGIO CHEST
2 of 7 series · 18 of 46 positions shown · IV contrast (iopamidol)
Comparison: 12/04/2017.

CLINICAL DATA: Ascending aortic aneurysm.

EXAM:
CT ANGIOGRAPHY CHEST WITH CONTRAST
TECHNIQUE: Multidetector CT imaging of the chest was performed using the
standard protocol during bolus administration of intravenous
contrast. Multiplanar CT image reconstructions and MIPs were
obtained to evaluate the vascular anatomy.
CONTRAST:  100mL K0INH2-9WB IOPAMIDOL (K0INH2-9WB) INJECTION 76%

[Series 4: aorta 3.0 i31f 2 · axial · 0.79mm/px · z∈[-341,-44]mm · 15 of 107 slices shown]
[im 4/107  lung]
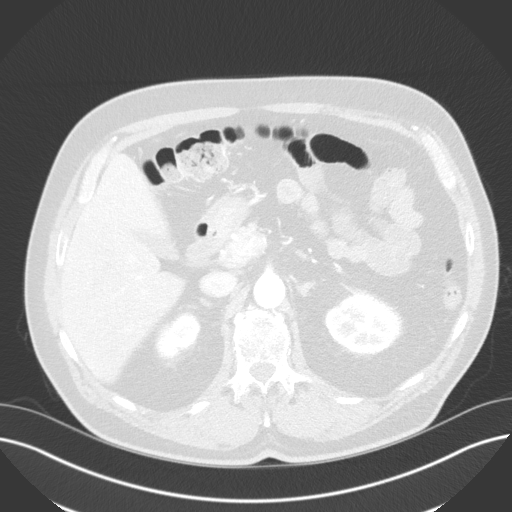
[im 12/107  soft-tissue]
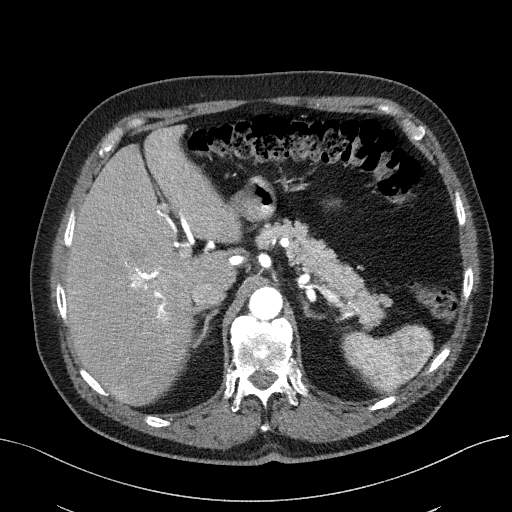
[im 20/107  lung]
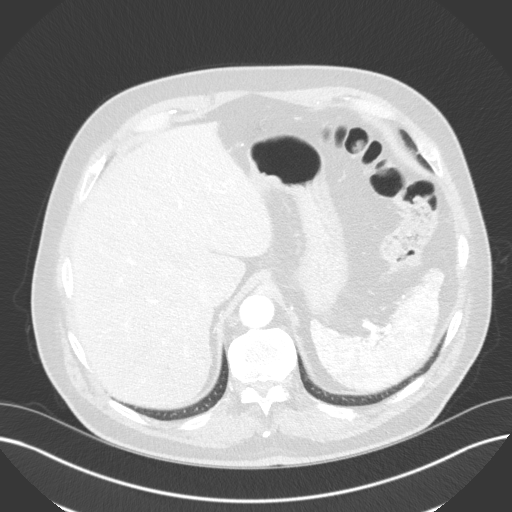
[im 28/107  soft-tissue]
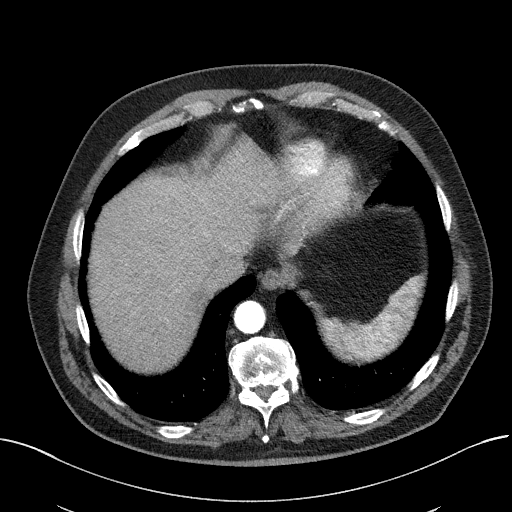
[im 32/107  lung]
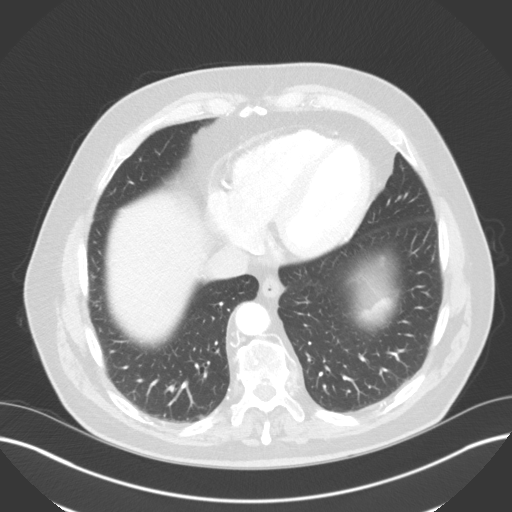
[im 40/107  soft-tissue]
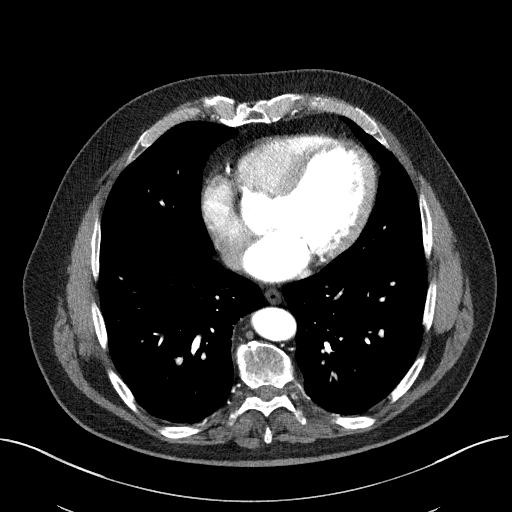
[im 48/107  lung]
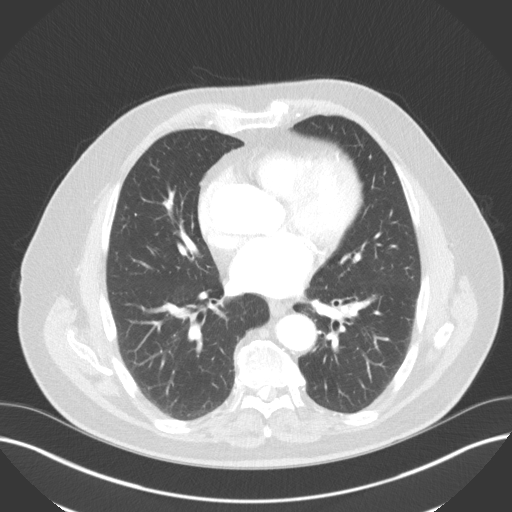
[im 55/107  soft-tissue]
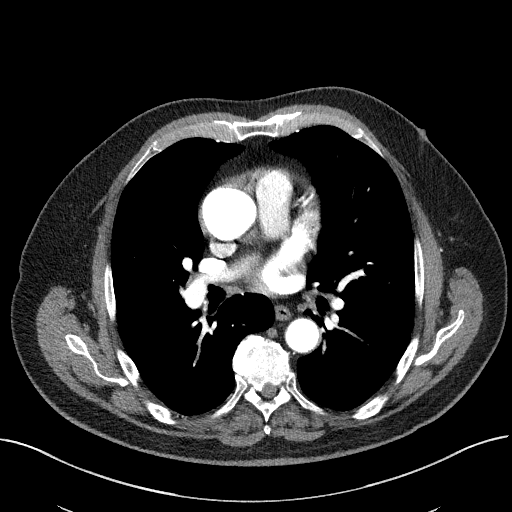
[im 59/107  lung]
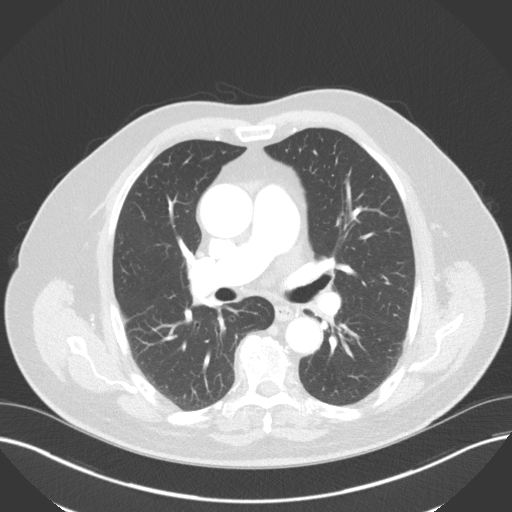
[im 67/107  soft-tissue]
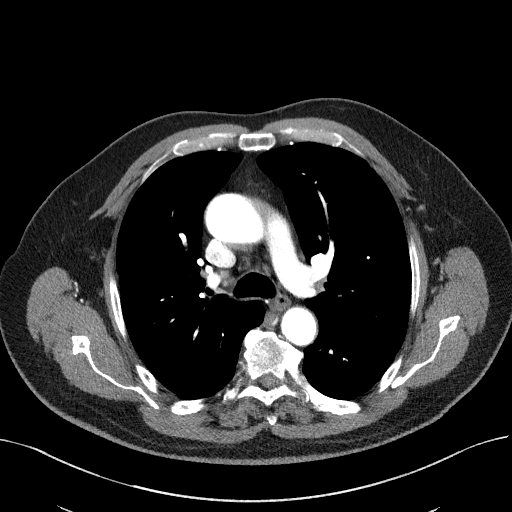
[im 75/107  lung]
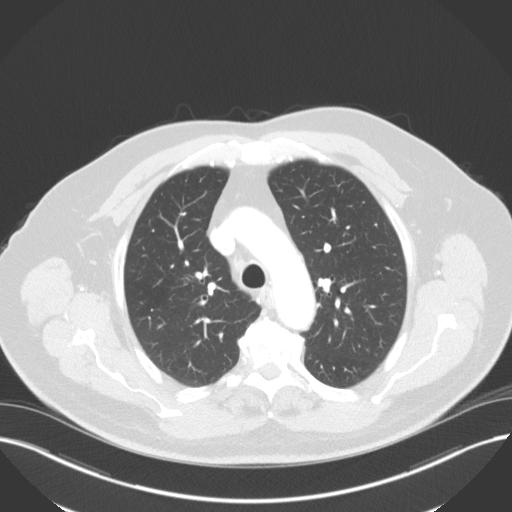
[im 79/107  soft-tissue]
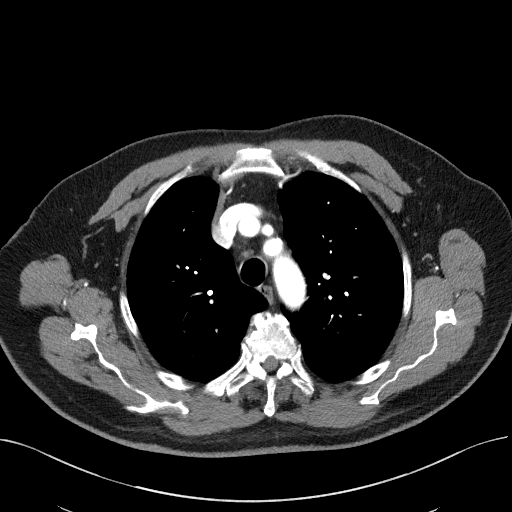
[im 87/107  lung]
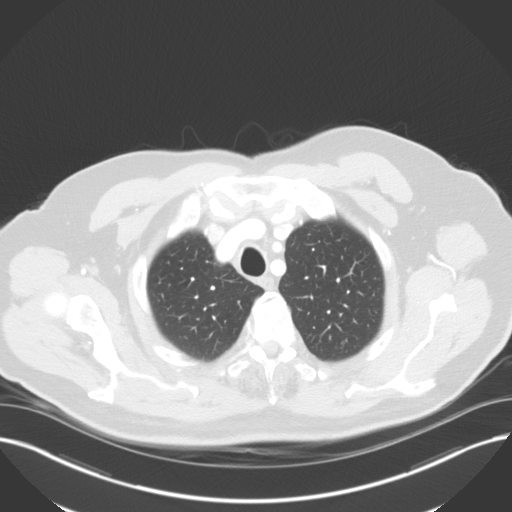
[im 95/107  soft-tissue]
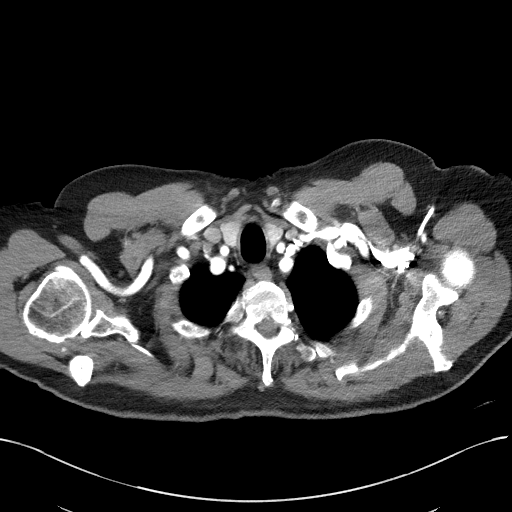
[im 103/107  lung]
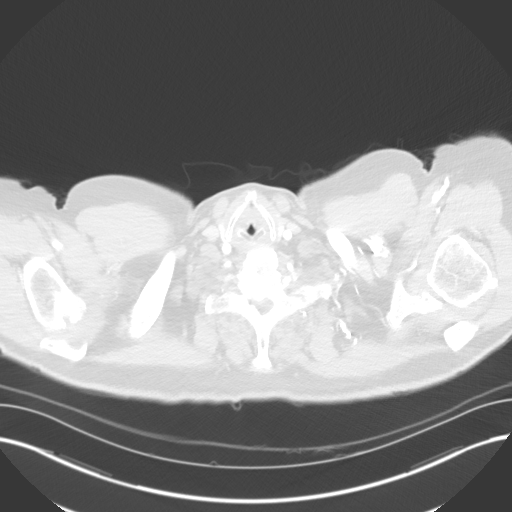

[Series 7: coronals · coronal · 0.63mm/px · 3 of 138 slices shown]
[im 35/138  soft-tissue]
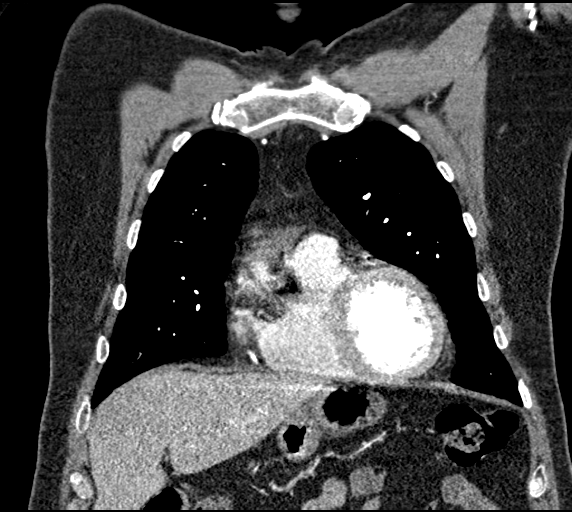
[im 69/138  soft-tissue]
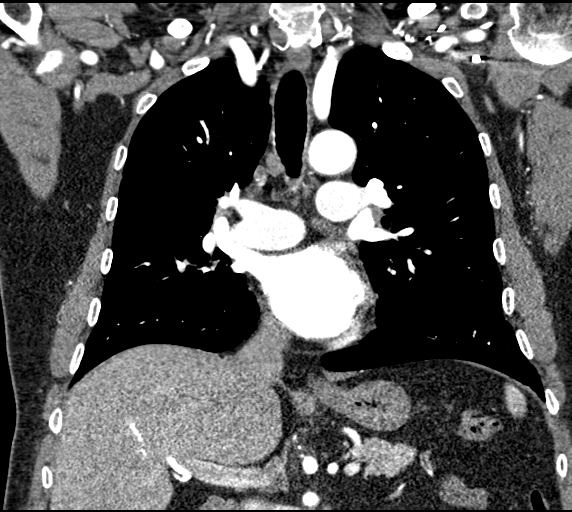
[im 103/138  soft-tissue]
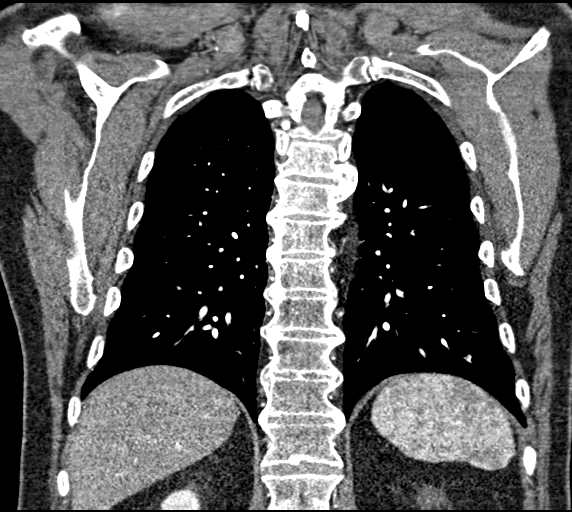

[18 of 46 positions shown; findings below may reference images not displayed]

FINDINGS: Cardiovascular: Atherosclerotic calcification of the arterial
vasculature, including three-vessel involvement of the coronary
arteries. Ascending aorta measures 4.1 cm, as before. Pulmonary
arteries are enlarged. Heart is at the upper limits of normal in
size. No pericardial effusion.

Mediastinum/Nodes: Mediastinal lymph nodes measure up to 10 mm in
the low right paratracheal station, as before. No hilar or axillary
adenopathy. Esophagus is grossly unremarkable.

Lungs/Pleura: A 5 x 12 mm nodule in the right upper lobe (series 5,
image 30) is unchanged from 12/04/2017. Oblong subpleural nodule
along the right hemidiaphragm measures 9 x 21 mm, also unchanged.
Lungs are otherwise clear. No pleural fluid. Airway is unremarkable.

Upper Abdomen: Visualized portions of the liver, gallbladder,
adrenal glands, kidneys, spleen, pancreas, stomach and bowel are
grossly unremarkable.

Musculoskeletal: Degenerative changes in the spine. No worrisome
lytic or sclerotic lesions.

Review of the MIP images confirms the above findings.
IMPRESSION: 1. Ascending aortic aneurysm, stable. Recommend annual imaging
followup by CTA or MRA. This recommendation follows 5272
ACCF/AHA/AATS/ACR/ASA/SCA/ESSING/OTONIEL/SUMATHI/ESADA Guidelines for the
Diagnosis and Management of Patients with Thoracic Aortic Disease.
Circulation. 5272; 121: e266-e369.
2. Right upper lobe nodule persists and is worrisome for primary
bronchogenic carcinoma. Other lesion size is borderline for PET
resolution, PET is recommended to guide further workup. These
results will be called to the ordering clinician or representative
by the Radiologist Assistant, and communication documented in the
PACS or zVision Dashboard.
3. Oblong smoothly marginated nodule along the right hemidiaphragm.
A subpleural lymph node could have this appearance. Continued
attention on follow-up exams is warranted.
4. Enlarged pulmonary arteries, indicative of pulmonary arterial
hypertension.
5. Aortic atherosclerosis ([id number]). Coronary artery
calcification.

## 2019-04-02 DIAGNOSIS — D492 Neoplasm of unspecified behavior of bone, soft tissue, and skin: Secondary | ICD-10-CM | POA: Diagnosis not present

## 2019-04-02 DIAGNOSIS — I10 Essential (primary) hypertension: Secondary | ICD-10-CM | POA: Diagnosis not present

## 2019-04-02 DIAGNOSIS — Z6829 Body mass index (BMI) 29.0-29.9, adult: Secondary | ICD-10-CM | POA: Diagnosis not present

## 2019-04-26 ENCOUNTER — Telehealth: Payer: Self-pay | Admitting: Cardiology

## 2019-04-26 NOTE — Telephone Encounter (Signed)
Needs Refill Valsartan

## 2019-04-29 ENCOUNTER — Other Ambulatory Visit: Payer: Self-pay | Admitting: Cardiology

## 2019-04-29 NOTE — Telephone Encounter (Signed)
Medication already refilled.

## 2019-05-28 DIAGNOSIS — R079 Chest pain, unspecified: Secondary | ICD-10-CM | POA: Diagnosis not present

## 2019-05-28 DIAGNOSIS — Z23 Encounter for immunization: Secondary | ICD-10-CM | POA: Diagnosis not present

## 2019-05-29 LAB — LIPID PANEL
Chol/HDL Ratio: 7.5 ratio — ABNORMAL HIGH (ref 0.0–5.0)
Cholesterol, Total: 234 mg/dL — ABNORMAL HIGH (ref 100–199)
HDL: 31 mg/dL — ABNORMAL LOW (ref >39)
LDL Chol Calc (NIH): 118 mg/dL — ABNORMAL HIGH (ref 0–99)
Triglycerides: 481 mg/dL — ABNORMAL HIGH (ref 0–149)
VLDL Cholesterol Cal: 85 mg/dL — ABNORMAL HIGH (ref 5–40)

## 2019-05-29 LAB — BASIC METABOLIC PANEL
BUN/Creatinine Ratio: 16 (ref 10–24)
BUN: 29 mg/dL — ABNORMAL HIGH (ref 8–27)
CO2: 21 mmol/L (ref 20–29)
Calcium: 9.3 mg/dL (ref 8.6–10.2)
Chloride: 104 mmol/L (ref 96–106)
Creatinine, Ser: 1.8 mg/dL — ABNORMAL HIGH (ref 0.76–1.27)
GFR calc Af Amer: 42 mL/min/{1.73_m2} — ABNORMAL LOW (ref >59)
GFR calc non Af Amer: 36 mL/min/{1.73_m2} — ABNORMAL LOW (ref >59)
Glucose: 103 mg/dL — ABNORMAL HIGH (ref 65–99)
Potassium: 4.7 mmol/L (ref 3.5–5.2)
Sodium: 140 mmol/L (ref 134–144)

## 2019-05-30 ENCOUNTER — Ambulatory Visit (INDEPENDENT_AMBULATORY_CARE_PROVIDER_SITE_OTHER): Payer: Medicare Other | Admitting: Cardiology

## 2019-05-30 ENCOUNTER — Other Ambulatory Visit: Payer: Self-pay

## 2019-05-30 ENCOUNTER — Encounter: Payer: Self-pay | Admitting: Cardiology

## 2019-05-30 VITALS — BP 122/70 | HR 60 | Ht 73.0 in | Wt 220.0 lb

## 2019-05-30 DIAGNOSIS — I447 Left bundle-branch block, unspecified: Secondary | ICD-10-CM | POA: Diagnosis not present

## 2019-05-30 DIAGNOSIS — I712 Thoracic aortic aneurysm, without rupture: Secondary | ICD-10-CM

## 2019-05-30 DIAGNOSIS — R001 Bradycardia, unspecified: Secondary | ICD-10-CM | POA: Diagnosis not present

## 2019-05-30 DIAGNOSIS — I2729 Other secondary pulmonary hypertension: Secondary | ICD-10-CM | POA: Diagnosis not present

## 2019-05-30 DIAGNOSIS — I2699 Other pulmonary embolism without acute cor pulmonale: Secondary | ICD-10-CM

## 2019-05-30 DIAGNOSIS — I7121 Aneurysm of the ascending aorta, without rupture: Secondary | ICD-10-CM

## 2019-05-30 NOTE — Addendum Note (Signed)
Addended by: Ashok Norris on: 05/30/2019 10:07 AM   Modules accepted: Orders

## 2019-05-30 NOTE — Patient Instructions (Signed)
Medication Instructions:  Your physician recommends that you continue on your current medications as directed. Please refer to the Current Medication list given to you today.  If you need a refill on your cardiac medications before your next appointment, please call your pharmacy.   Lab work: Your physician recommends that you return for lab work in 2 weeks: bmp and in 3 months: fasting lipids.   If you have labs (blood work) drawn today and your tests are completely normal, you will receive your results only by: Marland Kitchen MyChart Message (if you have MyChart) OR . A paper copy in the mail If you have any lab test that is abnormal or we need to change your treatment, we will call you to review the results.  Testing/Procedures: Your physician has requested that you have an echocardiogram. Echocardiography is a painless test that uses sound waves to create images of your heart. It provides your doctor with information about the size and shape of your heart and how well your heart's chambers and valves are working. This procedure takes approximately one hour. There are no restrictions for this procedure.    Follow-Up: At [facility], you and your health needs are our priority.  As part of our continuing mission to provide you with exceptional heart care, we have created designated Provider Care Teams.  These Care Teams include your primary Cardiologist (physician) and Advanced Practice Providers (APPs -  Physician Assistants and Nurse Practitioners) who all work together to provide you with the care you need, when you need it. You will need a follow up appointment in 5 months.  Please call our office 2 months in advance to schedule this appointment.  You may see Jenne Campus, MD or another member of our [place] Provider Team in [place]: Shirlee More, MD . Jyl Heinz, MD  Any Other Special Instructions Will Be Listed Below (If Applicable).   Echocardiogram An echocardiogram is a  procedure that uses painless sound waves (ultrasound) to produce an image of the heart. Images from an echocardiogram can provide important information about:  Signs of coronary artery disease (CAD).  Aneurysm detection. An aneurysm is a weak or damaged part of an artery wall that bulges out from the normal force of blood pumping through the body.  Heart size and shape. Changes in the size or shape of the heart can be associated with certain conditions, including heart failure, aneurysm, and CAD.  Heart muscle function.  Heart valve function.  Signs of a past heart attack.  Fluid buildup around the heart.  Thickening of the heart muscle.  A tumor or infectious growth around the heart valves. Tell a health care provider about:  Any allergies you have.  All medicines you are taking, including vitamins, herbs, eye drops, creams, and over-the-counter medicines.  Any blood disorders you have.  Any surgeries you have had.  Any medical conditions you have.  Whether you are pregnant or may be pregnant. What are the risks? Generally, this is a safe procedure. However, problems may occur, including:  Allergic reaction to dye (contrast) that may be used during the procedure. What happens before the procedure? No specific preparation is needed. You may eat and drink normally. What happens during the procedure?   An IV tube may be inserted into one of your veins.  You may receive contrast through this tube. A contrast is an injection that improves the quality of the pictures from your heart.  A gel will be applied to your  chest.  A wand-like tool (transducer) will be moved over your chest. The gel will help to transmit the sound waves from the transducer.  The sound waves will harmlessly bounce off of your heart to allow the heart images to be captured in real-time motion. The images will be recorded on a computer. The procedure may vary among health care providers and hospitals.  What happens after the procedure?  You may return to your normal, everyday life, including diet, activities, and medicines, unless your health care provider tells you not to do that. Summary  An echocardiogram is a procedure that uses painless sound waves (ultrasound) to produce an image of the heart.  Images from an echocardiogram can provide important information about the size and shape of your heart, heart muscle function, heart valve function, and fluid buildup around your heart.  You do not need to do anything to prepare before this procedure. You may eat and drink normally.  After the echocardiogram is completed, you may return to your normal, everyday life, unless your health care provider tells you not to do that. This information is not intended to replace advice given to you by your health care provider. Make sure you discuss any questions you have with your health care provider. Document Released: 08/26/2000 Document Revised: 12/20/2018 Document Reviewed: 10/01/2016 Elsevier Patient Education  2020 [employer].

## 2019-05-30 NOTE — Progress Notes (Signed)
Cardiology Office Note:    Date:  05/30/2019   ID:  Timothy Moran, DOB 05-Apr-1946, MRN [id number]  PCP:  Cari Caraway, MD  Cardiologist:  Jenne Campus, MD    Referring MD: Dineen Kid, MD   Chief Complaint  Patient presents with  . Follow-up  Doing very well  History of Present Illness:    Timothy Moran is a 73 y.o. male with history of pulmonary emboli, left bundle branch block, ejection fraction 45%.  Comes today to my office for follow-up will doing well he does have multiple questions we talked in length about high blood pressure.  He is worried about side effects of his medications.  We also noticed that his kidney function is diminished I offered him either to switching to different medications or simply recheck kidney function within next few weeks to make sure that we get stable creatinine he preferred to do later.  He does have ascending aortic aneurysm that being checked recently it stable we will continue present management.  Overall doing well try to exercise on a regular basis but admits that he needs to do more.  He does wear Fitbit and on the Fitbit he usually got 10,000 steps a day yesterday he had 20.000  Past Medical History:  Diagnosis Date  . Benign brain tumor ([place])   . Chronic kidney disease    Stage 3  . History of pulmonary embolus (PE)   . Hypertension     Past Surgical History:  Procedure Laterality Date  . APPENDECTOMY    . HERNIA REPAIR      Current Medications: Current Meds  Medication Sig  . aspirin EC 81 MG tablet Take 1 tablet (81 mg total) by mouth daily.  . B Complex-C (SUPER B COMPLEX PO) Take 1 tablet by mouth daily.  . Cholecalciferol (VITAMIN D3) 5000 units TABS Take 1 tablet by mouth daily.   . Magnesium 400 MG CAPS Take 400 mg by mouth daily.   Marland Kitchen oxymetazoline (AFRIN) 0.05 % nasal spray Place 1 spray into both nostrils daily as needed for congestion.  . tamsulosin (FLOMAX) 0.4 MG CAPS capsule Take 2 capsules by mouth  daily.  . valsartan (DIOVAN) 320 MG tablet Take 1 tablet by mouth once daily     Allergies:   Patient has no known allergies.   Social History   Socioeconomic History  . Marital status: Married    Spouse name: Not on file  . Number of children: Not on file  . Years of education: Not on file  . Highest education level: Not on file  Occupational History  . Not on file  Social Needs  . Financial resource strain: Not on file  . Food insecurity    Worry: Not on file    Inability: Not on file  . Transportation needs    Medical: Not on file    Non-medical: Not on file  Tobacco Use  . Smoking status: Former Smoker    Packs/day: 0.50    Years: 5.00    Pack years: 2.50    Types: Cigarettes    Quit date: 09/12/1968    Years since quitting: 50.7  . Smokeless tobacco: Never Used  Substance and Sexual Activity  . Alcohol use: Yes    Comment: ONE PER WEEK  . Drug use: Never  . Sexual activity: Not on file  Lifestyle  . Physical activity    Days per week: Not on file    Minutes per session: Not on file  .  Stress: Not on file  Relationships  . Social Herbalist on phone: Not on file    Gets together: Not on file    Attends religious service: Not on file    Active member of club or organization: Not on file    Attends meetings of clubs or organizations: Not on file    Relationship status: Not on file  Other Topics Concern  . Not on file  Social History Narrative  . Not on file     Family History: The patient's family history includes Brain cancer in his sister; Diabetes in his mother; Leukemia in his brother; Melanoma in his brother. ROS:   Please see the history of present illness.    All 14 point review of systems negative except as described per history of present illness  EKGs/Labs/Other Studies Reviewed:      Recent Labs: 05/28/2019: BUN 29; Creatinine, Ser 1.80; Potassium 4.7; Sodium 140  Recent Lipid Panel    Component Value Date/Time   CHOL 234 (H)  05/28/2019 0947   TRIG 481 (H) 05/28/2019 0947   HDL 31 (L) 05/28/2019 0947   CHOLHDL 7.5 (H) 05/28/2019 0947   LDLCALC 116 (H) 01/10/2018 0000    Physical Exam:    VS:  BP 122/70   Pulse 60   Ht 6\' 1"  (1.854 m)   Wt 220 lb (99.8 kg)   SpO2 98%   BMI 29.03 kg/m     Wt Readings from Last 3 Encounters:  05/30/19 220 lb (99.8 kg)  02/26/19 224 lb (101.6 kg)  12/31/18 221 lb (100.2 kg)     GEN:  Well nourished, well developed in no acute distress HEENT: Normal NECK: No JVD; No carotid bruits LYMPHATICS: No lymphadenopathy CARDIAC: RRR, no murmurs, no rubs, no gallops RESPIRATORY:  Clear to auscultation without rales, wheezing or rhonchi  ABDOMEN: Soft, non-tender, non-distended MUSCULOSKELETAL:  No edema; No deformity  SKIN: Warm and dry LOWER EXTREMITIES: no swelling NEUROLOGIC:  Alert and oriented x 3 PSYCHIATRIC:  Normal affect   ASSESSMENT:    1. Left bundle branch block   2. Pulmonary hypertension due to thromboembolism ([place])   3. Sinus bradycardia   4. Ascending aortic aneurysm ([facility])    PLAN:    In order of problems listed above:  1. Left bundle branch was noted.  A echocardiogram will be repeated to recheck left ventricular ejection fraction 2. Pulmonary hypertension echocardiogram will be repeated to check on that overall he is doing well asymptomatic no shortness of breath 3. Sinus bradycardia denies having any dizziness 4. Ascending aortic aneurysm recent CT showed no worsening of the problem continue present management 5. Dyslipidemia with high triglycerides we talked about need to exercise change in diet reducing sugar intake he will try to do it and then will recheck cholesterol.  I also told him he may start taking fish oil.  He is interested in it we will do it.   Medication Adjustments/Labs and Tests Ordered: Current medicines are reviewed at length with the patient today.  Concerns regarding medicines are outlined above.  No orders of the defined  types were placed in this encounter.  Medication changes: No orders of the defined types were placed in this encounter.   Signed, Park Liter, MD, [facility] 05/30/2019 9:58 AM    [place]

## 2019-06-05 ENCOUNTER — Ambulatory Visit (HOSPITAL_BASED_OUTPATIENT_CLINIC_OR_DEPARTMENT_OTHER)
Admission: RE | Admit: 2019-06-05 | Discharge: 2019-06-05 | Disposition: A | Discharge status: Home / Self Care | Payer: Medicare Other | Source: Ambulatory Visit | Attending: Cardiology | Admitting: Cardiology

## 2019-06-05 ENCOUNTER — Other Ambulatory Visit: Payer: Self-pay

## 2019-06-05 DIAGNOSIS — I447 Left bundle-branch block, unspecified: Secondary | ICD-10-CM | POA: Insufficient documentation

## 2019-06-05 DIAGNOSIS — R001 Bradycardia, unspecified: Secondary | ICD-10-CM

## 2019-06-05 NOTE — Progress Notes (Signed)
  Echocardiogram 2D Echocardiogram has been performed.    Timothy Moran 06/05/2019, 11:47 AM

## 2019-06-07 ENCOUNTER — Telehealth: Payer: Self-pay | Admitting: *Deleted

## 2019-06-07 ENCOUNTER — Other Ambulatory Visit: Payer: Self-pay

## 2019-06-07 DIAGNOSIS — N183 Chronic kidney disease, stage 3 unspecified: Secondary | ICD-10-CM

## 2019-06-07 DIAGNOSIS — R931 Abnormal findings on diagnostic imaging of heart and coronary circulation: Secondary | ICD-10-CM

## 2019-06-07 HISTORY — DX: Abnormal findings on diagnostic imaging of heart and coronary circulation: R93.1

## 2019-06-07 NOTE — Telephone Encounter (Signed)
-----   Message from Park Liter, MD sent at 06/06/2019  9:44 AM EDT ----- Echo showed normal lvef  lvh  But possibility of LV thrombus The best to clarify is MRI of the heart, so if he is willing to do it, please schedule. I have low level of suspicion that it is true

## 2019-06-07 NOTE — Telephone Encounter (Signed)
Patient informed of echocardiogram results and advised that [name] recommends further evaluation with a cardiac MRI. Patient is agreeable. Order has been placed and precert message has been sent. Patient advised he will be contacted to schedule this appointment once insurance approval has been received. Patient verbalized understanding. No further questions.

## 2019-06-20 ENCOUNTER — Telehealth: Payer: Self-pay | Admitting: *Deleted

## 2019-06-20 ENCOUNTER — Encounter: Payer: Self-pay | Admitting: *Deleted

## 2019-06-20 NOTE — Telephone Encounter (Signed)
Spokew ith patient regarding Cardiac MRI scheduled for Monday 07/01/19 at 12:00 pm.  Arrival time is 11:15 am at 1st floor admissions office at [facility].  Will also mail information to patient.

## 2019-06-28 ENCOUNTER — Telehealth (HOSPITAL_COMMUNITY): Payer: Self-pay | Admitting: Emergency Medicine

## 2019-06-28 NOTE — Telephone Encounter (Signed)
Reaching out to patient to offer assistance regarding upcoming cardiac imaging study; pt verbalizes understanding of appt date/time, parking situation and where to check in,  and verified current allergies; name and call back number provided for further questions should they arise Marchia Bond RN Navigator Cardiac Imaging [facility] [phone] office [phone] cell  Orthopedic implants from hip replacement 15 yrs ago

## 2019-07-01 ENCOUNTER — Other Ambulatory Visit: Payer: Self-pay

## 2019-07-01 ENCOUNTER — Ambulatory Visit (HOSPITAL_COMMUNITY)
Admission: RE | Admit: 2019-07-01 | Discharge: 2019-07-01 | Disposition: A | Discharge status: Home / Self Care | Payer: Medicare Other | Source: Ambulatory Visit | Attending: Cardiology | Admitting: Cardiology

## 2019-07-01 DIAGNOSIS — R9439 Abnormal result of other cardiovascular function study: Secondary | ICD-10-CM | POA: Diagnosis not present

## 2019-07-01 DIAGNOSIS — I447 Left bundle-branch block, unspecified: Secondary | ICD-10-CM | POA: Diagnosis not present

## 2019-07-01 DIAGNOSIS — R931 Abnormal findings on diagnostic imaging of heart and coronary circulation: Secondary | ICD-10-CM | POA: Diagnosis not present

## 2019-07-01 MED ORDER — GADOBUTROL 1 MMOL/ML IV SOLN
10.0000 mL | Freq: Once | INTRAVENOUS | Status: AC | PRN
  Administered 2019-07-01: 14:00:00 10 mL via INTRAVENOUS

## 2019-07-24 DIAGNOSIS — S6991XA Unspecified injury of right wrist, hand and finger(s), initial encounter: Secondary | ICD-10-CM | POA: Diagnosis not present

## 2019-07-24 DIAGNOSIS — S62101A Fracture of unspecified carpal bone, right wrist, initial encounter for closed fracture: Secondary | ICD-10-CM | POA: Diagnosis not present

## 2019-07-29 DIAGNOSIS — L814 Other melanin hyperpigmentation: Secondary | ICD-10-CM | POA: Diagnosis not present

## 2019-07-29 DIAGNOSIS — L821 Other seborrheic keratosis: Secondary | ICD-10-CM | POA: Diagnosis not present

## 2019-07-29 DIAGNOSIS — D1801 Hemangioma of skin and subcutaneous tissue: Secondary | ICD-10-CM | POA: Diagnosis not present

## 2019-07-29 DIAGNOSIS — D225 Melanocytic nevi of trunk: Secondary | ICD-10-CM | POA: Diagnosis not present

## 2019-07-29 DIAGNOSIS — D485 Neoplasm of uncertain behavior of skin: Secondary | ICD-10-CM | POA: Diagnosis not present

## 2019-07-31 ENCOUNTER — Other Ambulatory Visit: Payer: Self-pay | Admitting: Thoracic Surgery (Cardiothoracic Vascular Surgery)

## 2019-07-31 DIAGNOSIS — I7121 Aneurysm of the ascending aorta, without rupture: Secondary | ICD-10-CM

## 2019-07-31 DIAGNOSIS — I712 Thoracic aortic aneurysm, without rupture, unspecified: Secondary | ICD-10-CM

## 2019-08-21 ENCOUNTER — Ambulatory Visit
Admission: RE | Admit: 2019-08-21 | Discharge: 2019-08-21 | Disposition: A | Discharge status: Home / Self Care | Payer: Medicare Other | Source: Ambulatory Visit | Attending: Thoracic Surgery (Cardiothoracic Vascular Surgery) | Admitting: Thoracic Surgery (Cardiothoracic Vascular Surgery)

## 2019-08-21 DIAGNOSIS — I712 Thoracic aortic aneurysm, without rupture, unspecified: Secondary | ICD-10-CM

## 2019-08-21 MED ORDER — IOPAMIDOL (ISOVUE-370) INJECTION 76%
75.0000 mL | Freq: Once | INTRAVENOUS | Status: AC | PRN
  Administered 2019-08-21: 50 mL via INTRAVENOUS

## 2019-08-27 ENCOUNTER — Ambulatory Visit: Payer: Medicare Other | Admitting: Thoracic Surgery (Cardiothoracic Vascular Surgery)

## 2019-08-29 ENCOUNTER — Ambulatory Visit (INDEPENDENT_AMBULATORY_CARE_PROVIDER_SITE_OTHER): Payer: Medicare Other | Admitting: Thoracic Surgery (Cardiothoracic Vascular Surgery)

## 2019-08-29 ENCOUNTER — Other Ambulatory Visit: Payer: Self-pay

## 2019-08-29 ENCOUNTER — Encounter: Payer: Self-pay | Admitting: Thoracic Surgery (Cardiothoracic Vascular Surgery)

## 2019-08-29 VITALS — BP 160/79 | HR 84 | Temp 97.8°F | Resp 20 | Ht 73.0 in | Wt 220.0 lb

## 2019-08-29 DIAGNOSIS — I712 Thoracic aortic aneurysm, without rupture, unspecified: Secondary | ICD-10-CM

## 2019-08-29 DIAGNOSIS — R911 Solitary pulmonary nodule: Secondary | ICD-10-CM

## 2019-08-29 NOTE — Progress Notes (Signed)
[address]       [place] [address]             [phone]      HPI: [name] returns for a scheduled follow-up visit  Timothy Moran is a 73 year old man hypertension, PE, a benign brain tumor, stage III chronic kidney disease, lung nodules, ascending aortic ectasia, left bundle branch block, and a benign spinal tumor.  His first found to have lung nodules in March 2019.  He presented with chest pain.  A CT angiogram showed 10 x 8 x 7 mm right upper lobe lung nodule.  There was a second elongated nodule in the right lower lobe adjacent to the diaphragm which was approximately 5 x 12 mm.  On PET CT there was some mild uptake in the right upper lobe nodule with SUV of 1.7.  There was no uptake in the lower lobe nodule.  I saw him in consultation recommended biopsy, but he will had a trip [place] planned and opted for radiographic follow-up.  On follow-up the nodules were unchanged so we have been following him since then.  I last saw him in June 2020.  He was doing well at that time.  In the interim since his last visit he has been feeling well.  He slipped and fell about a week ago on a muddy spot.  He hit his head and was "dazed."  He had some neck pain but that seems to be getting better.  He has not had any chest pain, pressure, tightness, or shortness of breath.  Past Medical History:  Diagnosis Date  . Benign brain tumor ([place])   . Chronic kidney disease    Stage 3  . History of pulmonary embolus (PE)   . Hypertension     Current Outpatient Medications  Medication Sig Dispense Refill  . aspirin EC 81 MG tablet Take 1 tablet (81 mg total) by mouth daily. 90 tablet 3  . B Complex-C (SUPER B COMPLEX PO) Take 1 tablet by mouth daily.    . Cholecalciferol (VITAMIN D3) 5000 units TABS Take 1 tablet by mouth daily.     . Magnesium 400 MG CAPS Take 400 mg by mouth daily.     Marland Kitchen oxymetazoline (AFRIN) 0.05 % nasal spray Place 1 spray into both nostrils  daily as needed for congestion.    . tamsulosin (FLOMAX) 0.4 MG CAPS capsule Take 2 capsules by mouth daily.    . valsartan (DIOVAN) 320 MG tablet Take 1 tablet by mouth once daily 90 tablet 1   No current facility-administered medications for this visit.    Physical Exam BP (!) 160/79   Pulse 84   Temp 97.8 F (36.6 C) (Skin)   Resp 20   Ht 6\' 1"  (1.854 m)   Wt 220 lb (99.8 kg)   SpO2 93% Comment: RA  BMI 29.78 kg/m  73 year old man in no acute distress Alert and oriented x3 with no focal deficits Lungs clear with equal breath sounds bilaterally Cardiac regular rate and rhythm normal S1 and S2 No peripheral edema  Diagnostic Tests: CT ANGIOGRAPHY CHEST WITH CONTRAST  TECHNIQUE: Multidetector CT imaging of the chest was performed using the standard protocol during bolus administration of intravenous contrast. Multiplanar CT image reconstructions and MIPs were obtained to evaluate the vascular anatomy.  CONTRAST:  24mL ISOVUE-370 IOPAMIDOL (ISOVUE-370) INJECTION 76%  Creatinine was obtained on site at [facility] at [address]  Results:  Creatinine 1.6 mg/dL.  COMPARISON:  02/21/2019, 07/03/2018, 12/04/2017 and PET 03/13/2018. MR thoracic spine 03/20/2019.  FINDINGS: Cardiovascular: Ascending aorta measures 4.2 cm, similar. Atherosclerotic calcification of the aorta and coronary arteries. Pulmonic trunk and heart are enlarged. No pericardial effusion.  Mediastinum/Nodes: Mediastinal lymph nodes are not enlarged by CT size criteria and are unchanged from 02/21/2019. No hilar or axillary adenopathy. Esophagus is unremarkable.  Lungs/Pleura: Right upper lobe nodule measures 0.5 x 1.2 cm (6/38), stable from baseline examination of 12/04/2017. Ovoid nodule along the right hemidiaphragm measures 0.8 x 1.7 cm (6/98), also stable from 12/04/2017 and likely a subpleural lymph node. No new or worrisome pulmonary nodules. No pleural fluid. Airway  is unremarkable.  Upper Abdomen: Visualized portions of the liver, gallbladder and adrenal glands are unremarkable. Suspect tiny stones in the upper pole right kidney. Visualized portions of the kidneys, spleen, pancreas, stomach and bowel are otherwise unremarkable. No upper abdominal adenopathy.  Musculoskeletal: 8 mm hyperdense lesion in the right aspect of the spinal canal, at approximately the T8 level, as on 03/20/2019. Degenerative changes in the spine. No worrisome lytic or sclerotic lesions. Flowing anterior osteophytosis in the thoracic spine.  Review of the MIP images confirms the above findings.  IMPRESSION: 1. Right upper lobe nodule is unchanged from 12/04/2017, was not shown to be hypermetabolic on 0000000 and is therefore considered benign. 2. Ascending Aortic aneurysm NOS (ICD10-I71.9), stable. Recommend annual imaging followup by CTA or MRA. This recommendation follows 2010 ACCF/AHA/AATS/ACR/ASA/SCA/SCAI/SIR/STS/SVM Guidelines for the Diagnosis and Management of Patients with Thoracic Aortic Disease. Circulation. 2010; [id number]. Aortic aneurysm NOS (ICD10-I71.9). 3. Suspect tiny stones in the upper pole right kidney. 4. Hyperattenuating lesion in the spinal canal, the T8 level, better seen and described on MR thoracic spine 03/21/2019. 5. Aortic atherosclerosis (ICD10-170.0). Coronary artery calcification. 6. Enlarged pulmonic trunk, indicative of pulmonary arterial hypertension.   Electronically Signed   By: Lorin Picket M.D.   On: 08/21/2019 12:18 I personally reviewed the CT images and concur with the findings noted above  Impression: Timothy Moran is a 73 year old man with a past medical history significant for pulmonary embolus, benign brain tumor, hypertension, benign spinal tumor, lung nodules, and a 4.2 cm ascending aneurysm.  Lung nodules-right upper and lower lobe nodules.  Both elongated, cigar shaped.  Have been stable  dating back to March 2019.  We will continue to follow as he will be getting CT scans to follow his aneurysm.  Ascending aneurysm-stable at 4.2 cm.  Given the stability of the lung nodules that we can go to annual imaging at this point.  Importance of blood pressure control was emphasized.  Hypertension-blood pressure elevated today.  He said that he was in a hurry coming to the office.  He checks himself at home on a fairly regular basis and rarely sees a number above 130.  No change in medications.  Continue home blood pressure monitoring.  Plan: Return in 1 year with CT angiogram of chest to follow-up ascending aneurysm and also lung nodules.  Melrose Nakayama, MD [facility] [phone]

## 2019-09-11 ENCOUNTER — Other Ambulatory Visit: Payer: Self-pay

## 2019-09-11 ENCOUNTER — Ambulatory Visit (INDEPENDENT_AMBULATORY_CARE_PROVIDER_SITE_OTHER): Payer: Medicare Other | Admitting: Cardiology

## 2019-09-11 ENCOUNTER — Ambulatory Visit: Payer: Medicare Other

## 2019-09-11 ENCOUNTER — Encounter: Payer: Self-pay | Admitting: Cardiology

## 2019-09-11 VITALS — BP 152/92 | HR 66 | Ht 72.0 in | Wt 226.0 lb

## 2019-09-11 DIAGNOSIS — I2699 Other pulmonary embolism without acute cor pulmonale: Secondary | ICD-10-CM

## 2019-09-11 DIAGNOSIS — R001 Bradycardia, unspecified: Secondary | ICD-10-CM

## 2019-09-11 DIAGNOSIS — R42 Dizziness and giddiness: Secondary | ICD-10-CM

## 2019-09-11 DIAGNOSIS — I2729 Other secondary pulmonary hypertension: Secondary | ICD-10-CM | POA: Diagnosis not present

## 2019-09-11 DIAGNOSIS — I447 Left bundle-branch block, unspecified: Secondary | ICD-10-CM

## 2019-09-11 NOTE — Progress Notes (Signed)
Cardiology Office Note:    Date:  09/11/2019   ID:  Timothy Moran, DOB 1946-05-26, MRN [id number]  PCP:  Cari Caraway, MD  Cardiologist:  Jenne Campus, MD    Referring MD: Cari Caraway, MD   Chief Complaint  Patient presents with  . Follow-up    5 MO FU    History of Present Illness:    Timothy Moran is a 73 y.o. male with past medical history significant left bundle branch block, also history of pulmonary emboli, chronic kidney disease, comes today to my office for follow-up.  Overall doing well still exercise on the regular basis and doing well when asking if he can for himself today with himself a year ago how is he doing he said he is doing better.  He has few concerns 1 is dizziness he thinks is related to Flomax which could be the case he said when he gets up quickly he will get dizzy as well as he turned his head quickly he will get dizzy.  Denies having any passing out.  He fell down few weeks ago walking on the wet grass however he did not passed out.  Denies have any chest pain, tightness, pressure, burning the chest.  Recently he did see cardiothoracic surgeon for evaluation of his aneurysm.  It is a stable size.  Past Medical History:  Diagnosis Date  . Benign brain tumor ([place])   . Chronic kidney disease    Stage 3  . History of pulmonary embolus (PE)   . Hypertension     Past Surgical History:  Procedure Laterality Date  . APPENDECTOMY    . HERNIA REPAIR      Current Medications: Current Meds  Medication Sig  . aspirin EC 81 MG tablet Take 1 tablet (81 mg total) by mouth daily.  . B Complex-C (SUPER B COMPLEX PO) Take 1 tablet by mouth daily.  . Cholecalciferol (VITAMIN D3) 5000 units TABS Take 1 tablet by mouth daily.   . Magnesium 400 MG CAPS Take 400 mg by mouth daily.   . tamsulosin (FLOMAX) 0.4 MG CAPS capsule Take 2 capsules by mouth daily.  . valsartan (DIOVAN) 320 MG tablet Take 1 tablet by mouth once daily     Allergies:   Patient  has no known allergies.   Social History   Socioeconomic History  . Marital status: Married    Spouse name: Not on file  . Number of children: Not on file  . Years of education: Not on file  . Highest education level: Not on file  Occupational History  . Not on file  Tobacco Use  . Smoking status: Former Smoker    Packs/day: 0.50    Years: 5.00    Pack years: 2.50    Types: Cigarettes    Quit date: 09/12/1968    Years since quitting: 51.0  . Smokeless tobacco: Never Used  Substance and Sexual Activity  . Alcohol use: Yes    Comment: ONE PER WEEK  . Drug use: Never  . Sexual activity: Not on file  Other Topics Concern  . Not on file  Social History Narrative  . Not on file   Social Determinants of Health   Financial Resource Strain:   . Difficulty of Paying Living Expenses: Not on file  Food Insecurity:   . Worried About Charity fundraiser in the Last Year: Not on file  . Ran Out of Food in the Last Year: Not on file  Transportation Needs:   .  Lack of Transportation (Medical): Not on file  . Lack of Transportation (Non-Medical): Not on file  Physical Activity:   . Days of Exercise per Week: Not on file  . Minutes of Exercise per Session: Not on file  Stress:   . Feeling of Stress : Not on file  Social Connections:   . Frequency of Communication with Friends and Family: Not on file  . Frequency of Social Gatherings with Friends and Family: Not on file  . Attends Religious Services: Not on file  . Active Member of Clubs or Organizations: Not on file  . Attends Archivist Meetings: Not on file  . Marital Status: Not on file     Family History: The patient's family history includes Brain cancer in his sister; Diabetes in his mother; Leukemia in his brother; Melanoma in his brother. ROS:   Please see the history of present illness.    All 14 point review of systems negative except as described per history of present illness  EKGs/Labs/Other Studies  Reviewed:    EKG done today shows sinus rhythm with sinus arrhythmia, first-degree AV block, left bundle branch block  Recent Labs: 05/28/2019: BUN 29; Creatinine, Ser 1.80; Potassium 4.7; Sodium 140  Recent Lipid Panel    Component Value Date/Time   CHOL 234 (H) 05/28/2019 0947   TRIG 481 (H) 05/28/2019 0947   HDL 31 (L) 05/28/2019 0947   CHOLHDL 7.5 (H) 05/28/2019 0947   LDLCALC 118 (H) 05/28/2019 0947    Physical Exam:    VS:  BP (!) 152/92   Pulse 66   Ht 6' (1.829 m)   Wt 226 lb (102.5 kg)   SpO2 97%   BMI 30.65 kg/m     Wt Readings from Last 3 Encounters:  09/11/19 226 lb (102.5 kg)  08/29/19 220 lb (99.8 kg)  05/30/19 220 lb (99.8 kg)     GEN:  Well nourished, well developed in no acute distress HEENT: Normal NECK: No JVD; No carotid bruits LYMPHATICS: No lymphadenopathy CARDIAC: RRR, no murmurs, no rubs, no gallops RESPIRATORY:  Clear to auscultation without rales, wheezing or rhonchi  ABDOMEN: Soft, non-tender, non-distended MUSCULOSKELETAL:  No edema; No deformity  SKIN: Warm and dry LOWER EXTREMITIES: no swelling NEUROLOGIC:  Alert and oriented x 3 PSYCHIATRIC:  Normal affect   ASSESSMENT:    1. Left bundle branch block   2. Pulmonary hypertension due to thromboembolism ([place])   3. Sinus bradycardia   4. Dizziness    PLAN:    In order of problems listed above:  1. Left bundle branch block noted continue present management. 2. Pulmonary hypertension due to pulmonary emboli last echocardiogram did not show any evidence of pulmonary hypertension.  There was some suspicion for LV thrombus however MRI done after that did not confirm that. 3. Sinus bradycardia with left bundle branch block and some dizziness.  I will ask him to wear Zio patch for a week to make sure there is no significant bradycardia. 4. Dizziness plan as outlined above   Medication Adjustments/Labs and Tests Ordered: Current medicines are reviewed at length with the patient today.   Concerns regarding medicines are outlined above.  Orders Placed This Encounter  Procedures  . EKG 12-Lead   Medication changes: No orders of the defined types were placed in this encounter.   Signed, Park Liter, MD, [facility] 09/11/2019 9:26 AM    [place]

## 2019-09-11 NOTE — Patient Instructions (Addendum)
Medication Instructions:  Your physician recommends that you continue on your current medications as directed. Please refer to the Current Medication list given to you today.  *If you need a refill on your cardiac medications before your next appointment, please call your pharmacy*  Lab Work: None ordered  If you have labs (blood work) drawn today and your tests are completely normal, you will receive your results only by: Marland Kitchen MyChart Message (if you have MyChart) OR . A paper copy in the mail If you have any lab test that is abnormal or we need to change your treatment, we will call you to review the results.  Testing/Procedures: .ZIO XT- Long Term Monitor Instructions   Your physician has requested you wear your ZIO patch monitor_______days.   This is a single patch monitor.  [employer] supplies one patch monitor per enrollment.  Additional stickers are not available.   Please do not apply patch if you will be having a Nuclear Stress Test, Echocardiogram, Cardiac CT, MRI, or Chest Xray during the time frame you would be wearing the monitor. The patch cannot be worn during these tests.  You cannot remove and re-apply the ZIO XT patch monitor.   Your ZIO patch monitor will be sent USPS Priority mail from [facility] directly to your home address. The monitor may also be mailed to a PO BOX if home delivery is not available.   It may take 3-5 days to receive your monitor after you have been enrolled.   Once you have received you monitor, please review enclosed instructions.  Your monitor has already been registered assigning a specific monitor serial # to you.   Applying the monitor   Shave hair from upper left chest.   Hold abrader disc by orange tab.  Rub abrader in 40 strokes over left upper chest as indicated in your monitor instructions.   Clean area with 4 enclosed alcohol pads .  Use all pads to assure are is cleaned thoroughly.  Let dry.   Apply patch as indicated in  monitor instructions.  Patch will be place under collarbone on left side of chest with arrow pointing upward.   Rub patch adhesive wings for 2 minutes.Remove white label marked "1".  Remove white label marked "2".  Rub patch adhesive wings for 2 additional minutes.   While looking in a mirror, press and release button in center of patch.  A small green light will flash 3-4 times .  This will be your only indicator the monitor has been turned on.     Do not shower for the first 24 hours.  You may shower after the first 24 hours.   Press button if you feel a symptom. You will hear a small click.  Record Date, Time and Symptom in the Patient Log Book.   When you are ready to remove patch, follow instructions on last 2 pages of Patient Log Book.  Stick patch monitor onto last page of Patient Log Book.   Place Patient Log Book in Ong box.  Use locking tab on box and tape box closed securely.  The [employer] has [employer] on it.  Please place in mailbox as soon as possible.  Your physician should have your test results approximately 7 days after the monitor has been mailed back to [facility].   Call [place] at [phone] if you have questions regarding your ZIO XT patch monitor.  Call them immediately if you see an orange  light blinking on your monitor.   If your monitor falls off in less than 4 days contact our Monitor department at [phone].  If your monitor becomes loose or falls off after 4 days call [employer] at [phone] for suggestions on securing your monitor.    Follow-Up: At [facility], you and your health needs are our priority.  As part of our continuing mission to provide you with exceptional heart care, we have created designated Provider Care Teams.  These Care Teams include your primary Cardiologist (physician) and Advanced Practice Providers (APPs -  Physician Assistants and Nurse Practitioners) who all work together to provide  you with the care you need, when you need it.  Your next appointment:   6 month(s)  The format for your next appointment:   In Person  Provider:   Jenne Campus, MD  Other Instructions

## 2019-09-16 ENCOUNTER — Telehealth: Payer: Self-pay | Admitting: Cardiology

## 2019-09-16 ENCOUNTER — Other Ambulatory Visit: Payer: Self-pay | Admitting: *Deleted

## 2019-09-16 MED ORDER — VALSARTAN 320 MG PO TABS: 320.0000 mg | ORAL_TABLET | Freq: Every day | ORAL | 1 refills | Status: DC

## 2019-09-16 NOTE — Telephone Encounter (Signed)
Refill sent to [facility]  

## 2019-09-16 NOTE — Telephone Encounter (Signed)
*  STAT* If patient is at the pharmacy, call can be transferred to refill team.   1. Which medications need to be refilled? (please list name of each medication and dose if known) Valsartan 320mg   2. Which pharmacy/location (including street and city if local pharmacy) is medication to be sent to? [facility]  3. Do they need a 30 day or 90 day supply? [place]

## 2019-10-03 ENCOUNTER — Other Ambulatory Visit: Payer: Self-pay

## 2019-10-03 ENCOUNTER — Encounter: Payer: Self-pay | Admitting: Cardiology

## 2019-10-03 MED ORDER — VALSARTAN 320 MG PO TABS: 320.0000 mg | ORAL_TABLET | Freq: Every day | ORAL | 1 refills | Status: DC

## 2019-10-03 NOTE — Progress Notes (Signed)
[facility]

## 2019-10-03 NOTE — Telephone Encounter (Signed)
Patient is calling to follow up in regards to medication prescription refill because he has 1 week worth of medication remaining. Patient states medication prescription was sent to incorrect pharmacy [facility]) and he is unable to get it from there. Patient states medication prescription order for Valsartan should have been sent through [place]. Patient states [facility] member ID with [facility] is [id number].   Patient is also requesting long term monitor results. Please call.

## 2019-10-03 NOTE — Telephone Encounter (Signed)
error 

## 2019-10-03 NOTE — Telephone Encounter (Signed)
Called patient and apologized that rx was sent to wrong pharmacy. I told him [facility] and he thanked me. I told him that the monitor report has been sent to his Dr. And once it has been reviewed we will give him a call with those results. He thanked me.

## 2019-10-11 ENCOUNTER — Encounter: Payer: Self-pay | Admitting: Cardiology

## 2019-10-11 ENCOUNTER — Ambulatory Visit (INDEPENDENT_AMBULATORY_CARE_PROVIDER_SITE_OTHER): Payer: Medicare Other | Admitting: Cardiology

## 2019-10-11 ENCOUNTER — Other Ambulatory Visit: Payer: Self-pay

## 2019-10-11 VITALS — BP 146/92 | HR 59 | Ht 72.0 in | Wt 222.0 lb

## 2019-10-11 DIAGNOSIS — I447 Left bundle-branch block, unspecified: Secondary | ICD-10-CM

## 2019-10-11 DIAGNOSIS — R001 Bradycardia, unspecified: Secondary | ICD-10-CM | POA: Diagnosis not present

## 2019-10-11 DIAGNOSIS — R0600 Dyspnea, unspecified: Secondary | ICD-10-CM

## 2019-10-11 DIAGNOSIS — I472 Ventricular tachycardia: Secondary | ICD-10-CM

## 2019-10-11 DIAGNOSIS — I2699 Other pulmonary embolism without acute cor pulmonale: Secondary | ICD-10-CM

## 2019-10-11 DIAGNOSIS — I7121 Aneurysm of the ascending aorta, without rupture: Secondary | ICD-10-CM

## 2019-10-11 DIAGNOSIS — I4729 Other ventricular tachycardia: Secondary | ICD-10-CM

## 2019-10-11 DIAGNOSIS — I712 Thoracic aortic aneurysm, without rupture: Secondary | ICD-10-CM | POA: Diagnosis not present

## 2019-10-11 DIAGNOSIS — R42 Dizziness and giddiness: Secondary | ICD-10-CM

## 2019-10-11 DIAGNOSIS — I2729 Other secondary pulmonary hypertension: Secondary | ICD-10-CM

## 2019-10-11 DIAGNOSIS — R0609 Other forms of dyspnea: Secondary | ICD-10-CM

## 2019-10-11 HISTORY — DX: Dizziness and giddiness: R42

## 2019-10-11 HISTORY — DX: Other ventricular tachycardia: I47.29

## 2019-10-11 HISTORY — DX: Ventricular tachycardia: I47.2

## 2019-10-11 MED ORDER — VALSARTAN 160 MG PO TABS: 160.0000 mg | ORAL_TABLET | Freq: Every day | ORAL | 1 refills | Status: DC

## 2019-10-11 NOTE — Patient Instructions (Signed)
Medication Instructions:  Your physician has recommended you make the following change in your medication:   DECREASE: Valsartan to 160 mg daily   *If you need a refill on your cardiac medications before your next appointment, please call your pharmacy*  Lab Work: None.  If you have labs (blood work) drawn today and your tests are completely normal, you will receive your results only by: Marland Kitchen MyChart Message (if you have MyChart) OR . A paper copy in the mail If you have any lab test that is abnormal or we need to change your treatment, we will call you to review the results.  Testing/Procedures: Your physician has requested that you have a lexiscan myoview. For further information please visit [email or web address]. Please follow instruction sheet, as given.    Follow-Up: At [facility], you and your health needs are our priority.  As part of our continuing mission to provide you with exceptional heart care, we have created designated Provider Care Teams.  These Care Teams include your primary Cardiologist (physician) and Advanced Practice Providers (APPs -  Physician Assistants and Nurse Practitioners) who all work together to provide you with the care you need, when you need it.  Your next appointment:   3 month(s)  The format for your next appointment:   In Person  Provider:   You will see Jenne Campus, MD.  Or, you can be scheduled with the following Advanced Practice Provider on your designated Care Team (at our [facility]):  Laurann Montana, FNP    Other Instructions   Cardiac Nuclear Scan A cardiac nuclear scan is a test that measures blood flow to the heart when a person is resting and when he or she is exercising. The test looks for problems such as:  Not enough blood reaching a portion of the heart.  The heart muscle not working normally. You may need this test if:  You have heart disease.  You have had abnormal lab results.  You have had heart  surgery or a balloon procedure to open up blocked arteries (angioplasty).  You have chest pain.  You have shortness of breath. In this test, a radioactive dye (tracer) is injected into your bloodstream. After the tracer has traveled to your heart, an imaging device is used to measure how much of the tracer is absorbed by or distributed to various areas of your heart. This procedure is usually done at a hospital and takes 2-4 hours. Tell a health care provider about:  Any allergies you have.  All medicines you are taking, including vitamins, herbs, eye drops, creams, and over-the-counter medicines.  Any problems you or family members have had with anesthetic medicines.  Any blood disorders you have.  Any surgeries you have had.  Any medical conditions you have.  Whether you are pregnant or may be pregnant. What are the risks? Generally, this is a safe procedure. However, problems may occur, including:  Serious chest pain and heart attack. This is only a risk if the stress portion of the test is done.  Rapid heartbeat.  Sensation of warmth in your chest. This usually passes quickly.  Allergic reaction to the tracer. What happens before the procedure?  Ask your health care provider about changing or stopping your regular medicines. This is especially important if you are taking diabetes medicines or blood thinners.  Follow instructions from your health care provider about eating or drinking restrictions.  Remove your jewelry on the day of the procedure. What happens during the procedure?  An IV will be inserted into one of your veins.  Your health care provider will inject a small amount of radioactive tracer through the IV.  You will wait for 20-40 minutes while the tracer travels through your bloodstream.  Your heart activity will be monitored with an electrocardiogram (ECG).  You will lie down on an exam table.  Images of your heart will be taken for about 15-20  minutes.  You may also have a stress test. For this test, one of the following may be done: ? You will exercise on a treadmill or stationary bike. While you exercise, your heart's activity will be monitored with an ECG, and your blood pressure will be checked. ? You will be given medicines that will increase blood flow to parts of your heart. This is done if you are unable to exercise.  When blood flow to your heart has peaked, a tracer will again be injected through the IV.  After 20-40 minutes, you will get back on the exam table and have more images taken of your heart.  Depending on the type of tracer used, scans may need to be repeated 3-4 hours later.  Your IV line will be removed when the procedure is over. The procedure may vary among health care providers and hospitals. What happens after the procedure?  Unless your health care provider tells you otherwise, you may return to your normal schedule, including diet, activities, and medicines.  Unless your health care provider tells you otherwise, you may increase your fluid intake. This will help to flush the contrast dye from your body. Drink enough fluid to keep your urine pale yellow.  Ask your health care provider, or the department that is doing the test: ? When will my results be ready? ? How will I get my results? Summary  A cardiac nuclear scan measures the blood flow to the heart when a person is resting and when he or she is exercising.  Tell your health care provider if you are pregnant.  Before the procedure, ask your health care provider about changing or stopping your regular medicines. This is especially important if you are taking diabetes medicines or blood thinners.  After the procedure, unless your health care provider tells you otherwise, increase your fluid intake. This will help flush the contrast dye from your body.  After the procedure, unless your health care provider tells you otherwise, you may return  to your normal schedule, including diet, activities, and medicines. This information is not intended to replace advice given to you by your health care provider. Make sure you discuss any questions you have with your health care provider. Document Revised: 02/12/2018 Document Reviewed: 02/12/2018 Elsevier Patient Education  [place].

## 2019-10-11 NOTE — Progress Notes (Signed)
Cardiology Office Note:    Date:  10/11/2019   ID:  Timothy Moran, DOB 06-28-1946, MRN [id number]  PCP:  Cari Caraway, MD  Cardiologist:  Jenne Campus, MD    Referring MD: Cari Caraway, MD   Chief Complaint  Patient presents with  . Follow-up    Discuss Monitor Results     History of Present Illness:    Timothy Moran is a 74 y.o. male with past medical history significant for left bundle branch block, also history of pulmonary emboli, chronic kidney disease, essential hypertension.  Comes today to my office for follow-up.  He did have monitor done which showed some evidence of ventricular ectopy as well as 1 short run of nonsustained ventricular tachycardia.  Only 4 beats in the row.  He does feel some palpitations but very rarely.  He also complained of having dizziness.  Dizziness typically happen when he gets up very quickly.  He does not have any dizziness while sitting or laying down.  Denies have any chest pain tightness squeezing pressure burning chest he will try to be active but obviously coronavirus situation make this situation somewhat difficult.  Past Medical History:  Diagnosis Date  . Benign brain tumor ([place])   . Chronic kidney disease    Stage 3  . History of pulmonary embolus (PE)   . Hypertension     Past Surgical History:  Procedure Laterality Date  . APPENDECTOMY    . HERNIA REPAIR      Current Medications: Current Meds  Medication Sig  . B Complex-C (SUPER B COMPLEX PO) Take 1 tablet by mouth daily.  . Cholecalciferol (VITAMIN D3) 5000 units TABS Take 1 tablet by mouth daily.   . Magnesium 400 MG CAPS Take 400 mg by mouth daily.   Marland Kitchen oxymetazoline (AFRIN) 0.05 % nasal spray Place 1 spray into both nostrils daily as needed for congestion.  . tamsulosin (FLOMAX) 0.4 MG CAPS capsule Take 2 capsules by mouth daily.  . valsartan (DIOVAN) 320 MG tablet Take 1 tablet (320 mg total) by mouth daily.     Allergies:   Patient has no known  allergies.   Social History   Socioeconomic History  . Marital status: Married    Spouse name: Not on file  . Number of children: Not on file  . Years of education: Not on file  . Highest education level: Not on file  Occupational History  . Not on file  Tobacco Use  . Smoking status: Former Smoker    Packs/day: 0.50    Years: 5.00    Pack years: 2.50    Types: Cigarettes    Quit date: 09/12/1968    Years since quitting: 51.1  . Smokeless tobacco: Never Used  Substance and Sexual Activity  . Alcohol use: Yes    Comment: ONE PER WEEK  . Drug use: Never  . Sexual activity: Not on file  Other Topics Concern  . Not on file  Social History Narrative  . Not on file   Social Determinants of Health   Financial Resource Strain:   . Difficulty of Paying Living Expenses: Not on file  Food Insecurity:   . Worried About Charity fundraiser in the Last Year: Not on file  . Ran Out of Food in the Last Year: Not on file  Transportation Needs:   . Lack of Transportation (Medical): Not on file  . Lack of Transportation (Non-Medical): Not on file  Physical Activity:   . Days of  Exercise per Week: Not on file  . Minutes of Exercise per Session: Not on file  Stress:   . Feeling of Stress : Not on file  Social Connections:   . Frequency of Communication with Friends and Family: Not on file  . Frequency of Social Gatherings with Friends and Family: Not on file  . Attends Religious Services: Not on file  . Active Member of Clubs or Organizations: Not on file  . Attends Archivist Meetings: Not on file  . Marital Status: Not on file     Family History: The patient's family history includes Brain cancer in his sister; Diabetes in his mother; Leukemia in his brother; Melanoma in his brother. ROS:   Please see the history of present illness.    All 14 point review of systems negative except as described per history of present illness  EKGs/Labs/Other Studies Reviewed:       Recent Labs: 05/28/2019: BUN 29; Creatinine, Ser 1.80; Potassium 4.7; Sodium 140  Recent Lipid Panel    Component Value Date/Time   CHOL 234 (H) 05/28/2019 0947   TRIG 481 (H) 05/28/2019 0947   HDL 31 (L) 05/28/2019 0947   CHOLHDL 7.5 (H) 05/28/2019 0947   LDLCALC 118 (H) 05/28/2019 0947    Physical Exam:    VS:  BP (!) 146/92   Pulse (!) 59   Ht 6' (1.829 m)   Wt 222 lb (100.7 kg)   SpO2 96%   BMI 30.11 kg/m     Wt Readings from Last 3 Encounters:  10/11/19 222 lb (100.7 kg)  09/11/19 226 lb (102.5 kg)  08/29/19 220 lb (99.8 kg)     GEN:  Well nourished, well developed in no acute distress HEENT: Normal NECK: No JVD; No carotid bruits LYMPHATICS: No lymphadenopathy CARDIAC: RRR, no murmurs, no rubs, no gallops RESPIRATORY:  Clear to auscultation without rales, wheezing or rhonchi  ABDOMEN: Soft, non-tender, non-distended MUSCULOSKELETAL:  No edema; No deformity  SKIN: Warm and dry LOWER EXTREMITIES: no swelling NEUROLOGIC:  Alert and oriented x 3 PSYCHIATRIC:  Normal affect   ASSESSMENT:    1. Left bundle branch block   2. Ascending aortic aneurysm (HCC) 4.2 cm based on CT from December 2020   3. Pulmonary hypertension due to thromboembolism ([place])   4. Sinus bradycardia   5. Nonsustained ventricular tachycardia ([place])   6. Dizziness    PLAN:    In order of problems listed above:  1. Left bundle branch block.  Chronic finding. 2. Ascending aneurysm measuring 4.2 cm based on CT from December 2020.  Stable we will continue yearly checked. 3. Pulmonary hypertension stable, last echocardiogram did not show any tricuspid regurgitation, therefore I doubt the significance of pulmonary hypertension. 4. Sinus bradycardia described to have some dizziness but dizziness look like it is orthostatic may be related to Flomax. 5. Nonsustained ventricular tachycardia.  Incidental finding on the monitor.  Obviously very concerning.  His ejection fraction is mildly  diminished which is related to left bundle branch block.  He did have a stress test done more than a year ago.  Asking to repeat a nuclear stress test to make sure he does not have any significant ischemia.  That should be a part of stratification of his findings on the monitor and namely ventricular tachycardia. 6. Dizziness look like orthostasis.  Interestingly because his blood pressure today in the office 146/92 but he brought in show me blood pressure measurements from home in the situations majority  of time his blood pressure is less than 120/70.  Therefore, he does have appointment with his urologist next week I asked him to discuss the issue potentially switching him from Flomax to some different medications that help with his prostate without significant drop in blood pressure.  I will also cut down his Diovan to only 180 mg daily.   Medication Adjustments/Labs and Tests Ordered: Current medicines are reviewed at length with the patient today.  Concerns regarding medicines are outlined above.  No orders of the defined types were placed in this encounter.  Medication changes: No orders of the defined types were placed in this encounter.   Signed, Park Liter, MD, [facility] 10/11/2019 11:09 AM    [place]

## 2019-10-15 ENCOUNTER — Telehealth (HOSPITAL_COMMUNITY): Payer: Self-pay

## 2019-10-15 NOTE — Telephone Encounter (Signed)
Encounter complete. 

## 2019-10-17 ENCOUNTER — Other Ambulatory Visit: Payer: Self-pay

## 2019-10-17 ENCOUNTER — Ambulatory Visit (HOSPITAL_COMMUNITY)
Admission: RE | Admit: 2019-10-17 | Discharge: 2019-10-17 | Disposition: A | Discharge status: Home / Self Care | Payer: Medicare Other | Source: Ambulatory Visit | Attending: Cardiovascular Disease | Admitting: Cardiovascular Disease

## 2019-10-17 DIAGNOSIS — R0600 Dyspnea, unspecified: Secondary | ICD-10-CM | POA: Diagnosis not present

## 2019-10-17 DIAGNOSIS — I472 Ventricular tachycardia: Secondary | ICD-10-CM | POA: Insufficient documentation

## 2019-10-17 DIAGNOSIS — I712 Thoracic aortic aneurysm, without rupture: Secondary | ICD-10-CM | POA: Diagnosis not present

## 2019-10-17 DIAGNOSIS — I447 Left bundle-branch block, unspecified: Secondary | ICD-10-CM | POA: Insufficient documentation

## 2019-10-17 LAB — MYOCARDIAL PERFUSION IMAGING
LV dias vol: 163 mL (ref 62–150)
LV sys vol: 85 mL
Peak HR: 73 {beats}/min
Rest HR: 59 {beats}/min
SDS: 2
SRS: 10
SSS: 12
TID: 1.15

## 2019-10-17 MED ORDER — TECHNETIUM TC 99M TETROFOSMIN IV KIT
10.6000 | PACK | Freq: Once | INTRAVENOUS | Status: AC | PRN
  Administered 2019-10-17: 10.6 via INTRAVENOUS
  Filled 2019-10-17: qty 11

## 2019-10-17 MED ORDER — REGADENOSON 0.4 MG/5ML IV SOLN
0.4000 mg | Freq: Once | INTRAVENOUS | Status: AC
  Administered 2019-10-17: 0.4 mg via INTRAVENOUS

## 2019-10-17 MED ORDER — TECHNETIUM TC 99M TETROFOSMIN IV KIT
31.6000 | PACK | Freq: Once | INTRAVENOUS | Status: AC | PRN
  Administered 2019-10-17: 31.6 via INTRAVENOUS
  Filled 2019-10-17: qty 32

## 2019-12-06 ENCOUNTER — Other Ambulatory Visit: Payer: Self-pay

## 2019-12-06 ENCOUNTER — Encounter: Payer: Self-pay | Admitting: Cardiology

## 2019-12-06 ENCOUNTER — Ambulatory Visit (INDEPENDENT_AMBULATORY_CARE_PROVIDER_SITE_OTHER): Payer: Medicare Other | Admitting: Cardiology

## 2019-12-06 VITALS — BP 136/84 | HR 56 | Ht 73.0 in | Wt 223.5 lb

## 2019-12-06 DIAGNOSIS — I712 Thoracic aortic aneurysm, without rupture: Secondary | ICD-10-CM | POA: Diagnosis not present

## 2019-12-06 DIAGNOSIS — R001 Bradycardia, unspecified: Secondary | ICD-10-CM | POA: Diagnosis not present

## 2019-12-06 DIAGNOSIS — I447 Left bundle-branch block, unspecified: Secondary | ICD-10-CM | POA: Diagnosis not present

## 2019-12-06 DIAGNOSIS — I472 Ventricular tachycardia: Secondary | ICD-10-CM | POA: Diagnosis not present

## 2019-12-06 DIAGNOSIS — I4729 Other ventricular tachycardia: Secondary | ICD-10-CM

## 2019-12-06 DIAGNOSIS — I7121 Aneurysm of the ascending aorta, without rupture: Secondary | ICD-10-CM

## 2019-12-06 DIAGNOSIS — Z86711 Personal history of pulmonary embolism: Secondary | ICD-10-CM

## 2019-12-06 NOTE — Progress Notes (Signed)
Cardiology Office Note:    Date:  12/06/2019   ID:  Mick Achter, DOB 02-Mar-1946, MRN [id number]  PCP:  Cari Caraway, MD  Cardiologist:  Jenne Campus, MD    Referring MD: Cari Caraway, MD   No chief complaint on file. Doing very well  History of Present Illness:    Timothy Moran is a 74 y.o. male with past medical history significant for left bundle branch block, diminished ejection fraction 48%, history of nonsustained ventricular tachycardia, sinus bradycardia, history of pulmonary emboli a long time ago only on aspirin now.  Also essential hypertension.  Comes today to my office for follow-up.  Overall doing great.  Asymptomatic trying to exercise on the regular basis does get about 10,000 steps every single day.  Denies having any chest pain tightness squeezing pressure burning chest no shortness of breath no dizziness no passing out.  Last time I see him there was some issue with dizziness while getting up.  And that is what prompted [place] to put monitor on him and wife led to a diagnosis of ventricle tachycardia.  Some medication has been changed that he is taking different medication for his prostate and dizziness is practically gone.  He is doing well.  Past Medical History:  Diagnosis Date  . Benign brain tumor ([place])   . Chronic kidney disease    Stage 3  . History of pulmonary embolus (PE)   . Hypertension     Past Surgical History:  Procedure Laterality Date  . APPENDECTOMY    . HERNIA REPAIR      Current Medications: Current Meds  Medication Sig  . aspirin EC 81 MG tablet Take 1 tablet (81 mg total) by mouth daily.  . B Complex-C (SUPER B COMPLEX PO) Take 1 tablet by mouth daily.  . Cholecalciferol (VITAMIN D3) 5000 units TABS Take 1 tablet by mouth daily.   . finasteride (PROSCAR) 5 MG tablet Take 5 mg by mouth daily.  . Magnesium 400 MG CAPS Take 400 mg by mouth daily.   Marland Kitchen oxymetazoline (AFRIN) 0.05 % nasal spray Place 1 spray into both nostrils  daily as needed for congestion.  . tamsulosin (FLOMAX) 0.4 MG CAPS capsule Take 2 capsules by mouth daily.  . valsartan (DIOVAN) 160 MG tablet Take 1 tablet (160 mg total) by mouth daily.     Allergies:   Patient has no known allergies.   Social History   Socioeconomic History  . Marital status: Married    Spouse name: Not on file  . Number of children: Not on file  . Years of education: Not on file  . Highest education level: Not on file  Occupational History  . Not on file  Tobacco Use  . Smoking status: Former Smoker    Packs/day: 0.50    Years: 5.00    Pack years: 2.50    Types: Cigarettes    Quit date: 09/12/1968    Years since quitting: 51.2  . Smokeless tobacco: Never Used  Substance and Sexual Activity  . Alcohol use: Yes    Comment: ONE PER WEEK  . Drug use: Never  . Sexual activity: Not on file  Other Topics Concern  . Not on file  Social History Narrative  . Not on file   Social Determinants of Health   Financial Resource Strain:   . Difficulty of Paying Living Expenses:   Food Insecurity:   . Worried About Charity fundraiser in the Last Year:   . Ran  Out of Food in the Last Year:   Transportation Needs:   . Lack of Transportation (Medical):   Marland Kitchen Lack of Transportation (Non-Medical):   Physical Activity:   . Days of Exercise per Week:   . Minutes of Exercise per Session:   Stress:   . Feeling of Stress :   Social Connections:   . Frequency of Communication with Friends and Family:   . Frequency of Social Gatherings with Friends and Family:   . Attends Religious Services:   . Active Member of Clubs or Organizations:   . Attends Archivist Meetings:   Marland Kitchen Marital Status:      Family History: The patient's family history includes Brain cancer in his sister; Diabetes in his mother; Leukemia in his brother; Melanoma in his brother. ROS:   Please see the history of present illness.    All 14 point review of systems negative except as  described per history of present illness  EKGs/Labs/Other Studies Reviewed:      Recent Labs: 05/28/2019: BUN 29; Creatinine, Ser 1.80; Potassium 4.7; Sodium 140  Recent Lipid Panel    Component Value Date/Time   CHOL 234 (H) 05/28/2019 0947   TRIG 481 (H) 05/28/2019 0947   HDL 31 (L) 05/28/2019 0947   CHOLHDL 7.5 (H) 05/28/2019 0947   LDLCALC 118 (H) 05/28/2019 0947    Physical Exam:    VS:  BP 136/84   Pulse (!) 56   Ht 6\' 1"  (1.854 m)   Wt 223 lb 8 oz (101.4 kg)   SpO2 97%   BMI 29.49 kg/m     Wt Readings from Last 3 Encounters:  12/06/19 223 lb 8 oz (101.4 kg)  10/17/19 222 lb (100.7 kg)  10/11/19 222 lb (100.7 kg)     GEN:  Well nourished, well developed in no acute distress HEENT: Normal NECK: No JVD; No carotid bruits LYMPHATICS: No lymphadenopathy CARDIAC: RRR, no murmurs, no rubs, no gallops RESPIRATORY:  Clear to auscultation without rales, wheezing or rhonchi  ABDOMEN: Soft, non-tender, non-distended MUSCULOSKELETAL:  No edema; No deformity  SKIN: Warm and dry LOWER EXTREMITIES: no swelling NEUROLOGIC:  Alert and oriented x 3 PSYCHIATRIC:  Normal affect   ASSESSMENT:    1. Left bundle branch block   2. Sinus bradycardia   3. Ascending aortic aneurysm (HCC) 4.2 cm based on CT from December 2020   4. Nonsustained ventricular tachycardia (HCC)   5. Personal history of pulmonary embolism    PLAN:    In order of problems listed above:  1. Left bundle branch of chronic known ejection fraction 48% we will continue monitoring. 2. Sinus bradycardia, not critical not on beta-blocker because of that continue present management. 3. Ascending aortic monitor is measuring 4.2 cm continue present management her last assessment in December 2020. 4. Questionable history of pulmonary hypertension.  Last echocardiogram did not show any tricuspid regurgitation, therefore I doubt very much in significant/severe pulmonary hypertension. 5. Nonsustained ventricular  tachycardia, denies having any dizziness or palpitation.  His ejection fraction is mildly diminished because of an left bundle branch block, stress test showed no evidence of ischemia overall is a low risk.  We will continue monitoring. 6. History of pulmonary emboli felt to be provoked.  Not anticoagulated. 7. Dyslipidemia with high triglycerides, not controlled.  We talked about diet to talk about exercises.  I will ask him to start taking fish oil.  Fasting lipid profile will need to be rechecked.  If LDL will  be still elevated thereafter we will add some statin.   Medication Adjustments/Labs and Tests Ordered: Current medicines are reviewed at length with the patient today.  Concerns regarding medicines are outlined above.  No orders of the defined types were placed in this encounter.  Medication changes: No orders of the defined types were placed in this encounter.   Signed, Park Liter, MD, [facility] 12/06/2019 8:30 AM    [place]

## 2019-12-06 NOTE — Patient Instructions (Signed)
Medication Instructions:  Your physician recommends that you continue on your current medications as directed. Please refer to the Current Medication list given to you today.  *If you need a refill on your cardiac medications before your next appointment, please call your pharmacy*   Lab Work: Your physician recommends that you return for lab work in 3 months fasting: LIPIDS,  no appointment needed please come to office between 8am -430 pm Monday through Friday. Please avoids 1230 pm -130 pm hour   If you have labs (blood work) drawn today and your tests are completely normal, you will receive your results only by: Marland Kitchen MyChart Message (if you have MyChart) OR . A paper copy in the mail If you have any lab test that is abnormal or we need to change your treatment, we will call you to review the results.   Testing/Procedures: None.    Follow-Up: At [facility], you and your health needs are our priority.  As part of our continuing mission to provide you with exceptional heart care, we have created designated Provider Care Teams.  These Care Teams include your primary Cardiologist (physician) and Advanced Practice Providers (APPs -  Physician Assistants and Nurse Practitioners) who all work together to provide you with the care you need, when you need it.  We recommend signing up for the patient portal called "MyChart".  Sign up information is provided on this After Visit Summary.  MyChart is used to connect with patients for Virtual Visits (Telemedicine).  Patients are able to view lab/test results, encounter notes, upcoming appointments, etc.  Non-urgent messages can be sent to your provider as well.   To learn more about what you can do with MyChart, go to [email or web address].    Your next appointment:   6 month(s)  The format for your next appointment:   In Person  Provider:   Jenne Campus, MD   Other Instructions

## 2020-01-02 ENCOUNTER — Ambulatory Visit: Payer: Medicare Other | Admitting: Cardiology

## 2020-01-13 ENCOUNTER — Encounter: Payer: Self-pay | Admitting: Cardiology

## 2020-04-10 ENCOUNTER — Other Ambulatory Visit: Payer: Self-pay | Admitting: Cardiology

## 2020-05-27 ENCOUNTER — Telehealth: Payer: Self-pay | Admitting: Cardiology

## 2020-05-27 MED ORDER — NITROGLYCERIN 0.4 MG SL SUBL: 0.4000 mg | SUBLINGUAL_TABLET | SUBLINGUAL | 11 refills | Status: DC | PRN

## 2020-05-27 NOTE — Telephone Encounter (Signed)
Called patient. He reports that he has been having intermittent chest pain on the left side for 2 weeks. He described it as twinges. He reports no radiation of the pain and no shortness of breath associated with the pain. The pain is random at times but sometimes associated with exertion. No current chest pain while on the phone. Patient blood pressure on the phone was 163/89 and heart rate 58. Patient aware if having active chest pain to go to the emergency department. For further review, will consult with dod.

## 2020-05-27 NOTE — Telephone Encounter (Signed)
Attempted to call patient. No answer and no voicemail.

## 2020-05-27 NOTE — Telephone Encounter (Signed)
Called patient informed him per Dr. Harriet Masson that we will move up appointment and that we are sending in nitro for him. Patient verbally understands to only use nitro if having chest pain. Patient understands to go to the emergency department if chest pain returns. No further questions.

## 2020-05-27 NOTE — Telephone Encounter (Signed)
Follow Up:; ° ° °Returning your call. °

## 2020-05-27 NOTE — Telephone Encounter (Signed)
STAT if patient feels like he/she is going to faint   1) Are you dizzy now? Not now but has been getting more dizzy  2) Do you feel faint or have you passed out? no  3)  Do you have any other symptoms? Some twinges around his heart  4) Have you checked your HR and BP (record if available)? BP 150/84 HR 57

## 2020-05-29 DIAGNOSIS — N189 Chronic kidney disease, unspecified: Secondary | ICD-10-CM | POA: Insufficient documentation

## 2020-05-29 DIAGNOSIS — I10 Essential (primary) hypertension: Secondary | ICD-10-CM | POA: Insufficient documentation

## 2020-05-29 DIAGNOSIS — D332 Benign neoplasm of brain, unspecified: Secondary | ICD-10-CM | POA: Insufficient documentation

## 2020-05-29 DIAGNOSIS — Z86711 Personal history of pulmonary embolism: Secondary | ICD-10-CM | POA: Insufficient documentation

## 2020-05-29 DIAGNOSIS — M25552 Pain in left hip: Secondary | ICD-10-CM

## 2020-05-29 HISTORY — DX: Pain in left hip: M25.552

## 2020-06-01 ENCOUNTER — Encounter: Payer: Self-pay | Admitting: Cardiology

## 2020-06-01 ENCOUNTER — Other Ambulatory Visit: Payer: Self-pay

## 2020-06-01 ENCOUNTER — Ambulatory Visit (INDEPENDENT_AMBULATORY_CARE_PROVIDER_SITE_OTHER): Payer: Medicare Other | Admitting: Cardiology

## 2020-06-01 VITALS — BP 138/82 | HR 70 | Ht 73.5 in | Wt 222.1 lb

## 2020-06-01 DIAGNOSIS — I712 Thoracic aortic aneurysm, without rupture: Secondary | ICD-10-CM | POA: Diagnosis not present

## 2020-06-01 DIAGNOSIS — I7121 Aneurysm of the ascending aorta, without rupture: Secondary | ICD-10-CM

## 2020-06-01 DIAGNOSIS — R0600 Dyspnea, unspecified: Secondary | ICD-10-CM

## 2020-06-01 DIAGNOSIS — I472 Ventricular tachycardia: Secondary | ICD-10-CM | POA: Diagnosis not present

## 2020-06-01 DIAGNOSIS — R002 Palpitations: Secondary | ICD-10-CM | POA: Diagnosis not present

## 2020-06-01 DIAGNOSIS — R0609 Other forms of dyspnea: Secondary | ICD-10-CM

## 2020-06-01 DIAGNOSIS — R42 Dizziness and giddiness: Secondary | ICD-10-CM

## 2020-06-01 DIAGNOSIS — R001 Bradycardia, unspecified: Secondary | ICD-10-CM

## 2020-06-01 DIAGNOSIS — Z86711 Personal history of pulmonary embolism: Secondary | ICD-10-CM | POA: Diagnosis not present

## 2020-06-01 DIAGNOSIS — I4729 Other ventricular tachycardia: Secondary | ICD-10-CM

## 2020-06-01 NOTE — Patient Instructions (Signed)
Medication Instructions:  Your physician recommends that you continue on your current medications as directed. Please refer to the Current Medication list given to you today.  *If you need a refill on your cardiac medications before your next appointment, please call your pharmacy*   Lab Work: Your physician recommends that you return for lab work today: bmp, lipid   If you have labs (blood work) drawn today and your tests are completely normal, you will receive your results only by: Marland Kitchen MyChart Message (if you have MyChart) OR . A paper copy in the mail If you have any lab test that is abnormal or we need to change your treatment, we will call you to review the results.   Testing/Procedures: Your physician has requested that you have an echocardiogram. Echocardiography is a painless test that uses sound waves to create images of your heart. It provides your doctor with information about the size and shape of your heart and how well your heart's chambers and valves are working. This procedure takes approximately one hour. There are no restrictions for this procedure.  A zio monitor was ordered today. It will remain on for 7 days. You will then return monitor and event diary in provided box. It takes 1-2 weeks for report to be downloaded and returned to [place]. We will call you with the results. If monitor falls off or has orange flashing light, please call Zio for further instructions.      Follow-Up: At [facility], you and your health needs are our priority.  As part of our continuing mission to provide you with exceptional heart care, we have created designated Provider Care Teams.  These Care Teams include your primary Cardiologist (physician) and Advanced Practice Providers (APPs -  Physician Assistants and Nurse Practitioners) who all work together to provide you with the care you need, when you need it.  We recommend signing up for the patient portal called "MyChart".  Sign up  information is provided on this After Visit Summary.  MyChart is used to connect with patients for Virtual Visits (Telemedicine).  Patients are able to view lab/test results, encounter notes, upcoming appointments, etc.  Non-urgent messages can be sent to your provider as well.   To learn more about what you can do with MyChart, go to [email or web address].    Your next appointment:   2 month(s)  The format for your next appointment:   In Person  Provider:   Jenne Campus, MD   Other Instructions   Echocardiogram An echocardiogram is a procedure that uses painless sound waves (ultrasound) to produce an image of the heart. Images from an echocardiogram can provide important information about:  Signs of coronary artery disease (CAD).  Aneurysm detection. An aneurysm is a weak or damaged part of an artery wall that bulges out from the normal force of blood pumping through the body.  Heart size and shape. Changes in the size or shape of the heart can be associated with certain conditions, including heart failure, aneurysm, and CAD.  Heart muscle function.  Heart valve function.  Signs of a past heart attack.  Fluid buildup around the heart.  Thickening of the heart muscle.  A tumor or infectious growth around the heart valves. Tell a health care provider about:  Any allergies you have.  All medicines you are taking, including vitamins, herbs, eye drops, creams, and over-the-counter medicines.  Any blood disorders you have.  Any surgeries you have had.  Any medical conditions you have.  Whether you are pregnant or may be pregnant. What are the risks? Generally, this is a safe procedure. However, problems may occur, including:  Allergic reaction to dye (contrast) that may be used during the procedure. What happens before the procedure? No specific preparation is needed. You may eat and drink normally. What happens during the procedure?   An IV tube may be  inserted into one of your veins.  You may receive contrast through this tube. A contrast is an injection that improves the quality of the pictures from your heart.  A gel will be applied to your chest.  A wand-like tool (transducer) will be moved over your chest. The gel will help to transmit the sound waves from the transducer.  The sound waves will harmlessly bounce off of your heart to allow the heart images to be captured in real-time motion. The images will be recorded on a computer. The procedure may vary among health care providers and hospitals. What happens after the procedure?  You may return to your normal, everyday life, including diet, activities, and medicines, unless your health care provider tells you not to do that. Summary  An echocardiogram is a procedure that uses painless sound waves (ultrasound) to produce an image of the heart.  Images from an echocardiogram can provide important information about the size and shape of your heart, heart muscle function, heart valve function, and fluid buildup around your heart.  You do not need to do anything to prepare before this procedure. You may eat and drink normally.  After the echocardiogram is completed, you may return to your normal, everyday life, unless your health care provider tells you not to do that. This information is not intended to replace advice given to you by your health care provider. Make sure you discuss any questions you have with your health care provider. Document Revised: 12/20/2018 Document Reviewed: 10/01/2016 Elsevier Patient Education  [place].

## 2020-06-01 NOTE — Progress Notes (Signed)
Cardiology Office Note:    Date:  06/01/2020   ID:  Timothy Moran, DOB August 26, 1946, MRN [id number]  PCP:  Cari Caraway, MD  Cardiologist:  Jenne Campus, MD    Referring MD: Cari Caraway, MD   No chief complaint on file. I am weak and dizzy  History of Present Illness:    Timothy Moran is a 74 y.o. male with past medical history significant for left bundle branch block, ejection fraction 48%, history of nonsustained ventricular tachycardia, work-up has been negative which include stress test showing no evidence of ischemia, history of pulmonary emboli long time ago felt to be provoked, not long-term anticoagulation.  He is being always very active always walking a lot and doing at least 10,000 steps every single day have no difficulty doing it.  He also have a new friend with whom he walks every single day in the morning, however, lately he started noticing some issues #1 he described to have episode of chest pain pain is tingling like located on the left side of his chest typically happen at night when he is trying to lay down in the bed.  It never happened with exercise.  Does not have any characteristic of angina.  I did review stress test which was done in spring which showed no evidence of ischemia. Another issue is the fact that he said when he walks especially uphill he will get tired and exhausted and also some short of breath.  Not to the point that he cannot continue but he clearly noticed some changes.  No chest pain tightness squeezing pressure burning chest.  Also sometimes when he check his heart rate he will feel some skipping.  Also noted slow heart rate.  Another issue is the fact that he was stretching or doing something and he sustained some injury to his left hip.  In the matter-of-fact today he scheduled to have an x-ray of his hip with appoint with orthopedics.  Past Medical History:  Diagnosis Date  . Abnormal echocardiogram 06/07/2019  . Ascending aortic  aneurysm (HCC) 4.2 cm based on CT from December 2020 01/29/2018   4.3 cm by echo in 2019  . Benign brain tumor ([place])   . Benign neoplasm of brain ([place]) 12/19/2017  . Chest pain 12/04/2017  . Chronic kidney disease    Stage 3  . Chronic kidney disease, stage III (moderate) 12/19/2017  . Dizziness 10/11/2019  . History of pulmonary embolus (PE)   . Hypertension   . Left bundle branch block 12/19/2017  . Nonsustained ventricular tachycardia ([place]) 10/11/2019  . Personal history of pulmonary embolism 12/19/2017  . Pulmonary hypertension due to thromboembolism ([place]) 03/09/2018   See echo 12/27/17 with nl PAS vs CTa chest 02/23/18   . Sinus bradycardia 12/22/2017  . Solitary pulmonary nodule on lung CT 03/08/2018   CT 12/04/17 1.0 x 0.8 x 0.7 cm nodular opacity in the RUL vs not seen 03/16/10 posterior segment of the right upper lobe. Marland KitchenSpirometry 03/08/2018    FEV1 3.86 (108%)  Ratio 96 s prior rx  - PET  03/13/18   Low grade c/w adenoca > rec T surgery eval      Past Surgical History:  Procedure Laterality Date  . APPENDECTOMY    . HERNIA REPAIR      Current Medications: Current Meds  Medication Sig  . aspirin EC 81 MG tablet Take 81 mg by mouth every other day. Swallow whole.  . Azelastine HCl 137 MCG/SPRAY SOLN Place 1 spray  into both nostrils at bedtime.  . B Complex-C (SUPER B COMPLEX PO) Take 1 tablet by mouth daily.  . Cholecalciferol (VITAMIN D3) 5000 units TABS Take 1 tablet by mouth daily.   . finasteride (PROSCAR) 5 MG tablet Take 5 mg by mouth daily.  . Magnesium 400 MG CAPS Take 400 mg by mouth daily.   . nitroGLYCERIN (NITROSTAT) 0.4 MG SL tablet Place 1 tablet (0.4 mg total) under the tongue every 5 (five) minutes as needed for chest pain.  . Omega-3 Fatty Acids (FISH OIL) 1360 MG CAPS Take 2 capsules by mouth daily.  . tamsulosin (FLOMAX) 0.4 MG CAPS capsule Take 2 capsules by mouth daily.  . valsartan (DIOVAN) 160 MG tablet TAKE 1 TABLET DAILY     Allergies:   Patient has no known  allergies.   Social History   Socioeconomic History  . Marital status: Married    Spouse name: Not on file  . Number of children: Not on file  . Years of education: Not on file  . Highest education level: Not on file  Occupational History  . Not on file  Tobacco Use  . Smoking status: Former Smoker    Packs/day: 0.50    Years: 5.00    Pack years: 2.50    Types: Cigarettes    Quit date: 09/12/1968    Years since quitting: 51.7  . Smokeless tobacco: Never Used  Vaping Use  . Vaping Use: Never used  Substance and Sexual Activity  . Alcohol use: Yes    Comment: ONE PER WEEK  . Drug use: Never  . Sexual activity: Not on file  Other Topics Concern  . Not on file  Social History Narrative  . Not on file   Social Determinants of Health   Financial Resource Strain:   . Difficulty of Paying Living Expenses: Not on file  Food Insecurity:   . Worried About Charity fundraiser in the Last Year: Not on file  . Ran Out of Food in the Last Year: Not on file  Transportation Needs:   . Lack of Transportation (Medical): Not on file  . Lack of Transportation (Non-Medical): Not on file  Physical Activity:   . Days of Exercise per Week: Not on file  . Minutes of Exercise per Session: Not on file  Stress:   . Feeling of Stress : Not on file  Social Connections:   . Frequency of Communication with Friends and Family: Not on file  . Frequency of Social Gatherings with Friends and Family: Not on file  . Attends Religious Services: Not on file  . Active Member of Clubs or Organizations: Not on file  . Attends Archivist Meetings: Not on file  . Marital Status: Not on file     Family History: The patient's family history includes Brain cancer in his sister; Diabetes in his mother; Leukemia in his brother; Melanoma in his brother. ROS:   Please see the history of present illness.    All 14 point review of systems negative except as described per history of present  illness  EKGs/Labs/Other Studies Reviewed:      Recent Labs: No results found for requested labs within last 8760 hours.  Recent Lipid Panel    Component Value Date/Time   CHOL 234 (H) 05/28/2019 0947   TRIG 481 (H) 05/28/2019 0947   HDL 31 (L) 05/28/2019 0947   CHOLHDL 7.5 (H) 05/28/2019 0947   LDLCALC 118 (H) 05/28/2019 3500  Physical Exam:    VS:  BP 138/82   Pulse 70   Ht 6' 1.5" (1.867 m)   Wt 222 lb 1.3 oz (100.7 kg)   SpO2 95%   BMI 28.90 kg/m     Wt Readings from Last 3 Encounters:  06/01/20 222 lb 1.3 oz (100.7 kg)  12/06/19 223 lb 8 oz (101.4 kg)  10/17/19 222 lb (100.7 kg)     GEN:  Well nourished, well developed in no acute distress HEENT: Normal NECK: No JVD; No carotid bruits LYMPHATICS: No lymphadenopathy CARDIAC: RRR, no murmurs, no rubs, no gallops RESPIRATORY:  Clear to auscultation without rales, wheezing or rhonchi  ABDOMEN: Soft, non-tender, non-distended MUSCULOSKELETAL:  No edema; No deformity  SKIN: Warm and dry LOWER EXTREMITIES: no swelling NEUROLOGIC:  Alert and oriented x 3 PSYCHIATRIC:  Normal affect   ASSESSMENT:    1. Sinus bradycardia   2. Ascending aortic aneurysm (HCC) 4.2 cm based on CT from December 2020   3. History of pulmonary embolus (PE)   4. Dizziness   5. Nonsustained ventricular tachycardia (HCC)    PLAN:    In order of problems listed above:  1. Sinus bradycardia with weakness fatigue on top of that he does have some extrasystole.  I will ask him to wear Zio patch to see how much bradycardia he have and if he does have significant ventricular ectopy.  I will also ask him to have an echocardiogram done to recheck left ventricle ejection fraction as well as look at the ascending aortic aneurysm size. 2. Ascending aortic aneurysm again echocardiogram will be done to look into it. 3. History of pulmonary emboli no new issues.  Will wait for echocardiogram. 4. Dizziness probably multifactorial he does have  baseline bradycardia on top of that if he truly got frequent ventricular ectopy that could be responsible for his symptoms. 5. History of nonsustained ventricular tachycardia no passing out but dizziness of course is concerning.  Again monitor will be placed to delineate significance of this finding. 6. Dyslipidemia: Asking to have fasting lipid profile done again.  His LDL it to be probably better controlled.   Medication Adjustments/Labs and Tests Ordered: Current medicines are reviewed at length with the patient today.  Concerns regarding medicines are outlined above.  No orders of the defined types were placed in this encounter.  Medication changes: No orders of the defined types were placed in this encounter.   Signed, Park Liter, MD, [facility] 06/01/2020 8:48 AM    [place]

## 2020-06-02 LAB — BASIC METABOLIC PANEL
BUN/Creatinine Ratio: 13 (ref 10–24)
BUN: 20 mg/dL (ref 8–27)
CO2: 20 mmol/L (ref 20–29)
Calcium: 9.8 mg/dL (ref 8.6–10.2)
Chloride: 102 mmol/L (ref 96–106)
Creatinine, Ser: 1.51 mg/dL — ABNORMAL HIGH (ref 0.76–1.27)
GFR calc Af Amer: 51 mL/min/{1.73_m2} — ABNORMAL LOW (ref >59)
GFR calc non Af Amer: 44 mL/min/{1.73_m2} — ABNORMAL LOW (ref >59)
Glucose: 127 mg/dL — ABNORMAL HIGH (ref 65–99)
Potassium: 4.6 mmol/L (ref 3.5–5.2)
Sodium: 139 mmol/L (ref 134–144)

## 2020-06-02 LAB — LIPID PANEL
Chol/HDL Ratio: 7.5 ratio — ABNORMAL HIGH (ref 0.0–5.0)
Cholesterol, Total: 247 mg/dL — ABNORMAL HIGH (ref 100–199)
HDL: 33 mg/dL — ABNORMAL LOW (ref >39)
LDL Chol Calc (NIH): 151 mg/dL — ABNORMAL HIGH (ref 0–99)
Triglycerides: 338 mg/dL — ABNORMAL HIGH (ref 0–149)
VLDL Cholesterol Cal: 63 mg/dL — ABNORMAL HIGH (ref 5–40)

## 2020-06-03 ENCOUNTER — Ambulatory Visit: Payer: Medicare Other | Admitting: Cardiology

## 2020-06-05 ENCOUNTER — Telehealth: Payer: Self-pay | Admitting: Emergency Medicine

## 2020-06-05 ENCOUNTER — Ambulatory Visit (INDEPENDENT_AMBULATORY_CARE_PROVIDER_SITE_OTHER): Payer: Medicare Other

## 2020-06-05 ENCOUNTER — Telehealth: Payer: Self-pay | Admitting: Cardiology

## 2020-06-05 DIAGNOSIS — R002 Palpitations: Secondary | ICD-10-CM

## 2020-06-05 DIAGNOSIS — R0600 Dyspnea, unspecified: Secondary | ICD-10-CM

## 2020-06-05 DIAGNOSIS — R001 Bradycardia, unspecified: Secondary | ICD-10-CM

## 2020-06-05 DIAGNOSIS — R0609 Other forms of dyspnea: Secondary | ICD-10-CM

## 2020-06-05 DIAGNOSIS — E785 Hyperlipidemia, unspecified: Secondary | ICD-10-CM

## 2020-06-05 MED ORDER — ROSUVASTATIN CALCIUM 10 MG PO TABS: 10.0000 mg | ORAL_TABLET | Freq: Every day | ORAL | 1 refills | Status: DC

## 2020-06-05 MED ORDER — ROSUVASTATIN CALCIUM 10 MG PO TABS: 10.0000 mg | ORAL_TABLET | Freq: Every day | ORAL | 3 refills | Status: DC

## 2020-06-05 NOTE — Telephone Encounter (Signed)
-----   Message from Park Liter, MD sent at 06/04/2020 11:08 AM EDT ----- Cholesterol is clearly poorly controlled, I would suggest to start taking 10 mg of Crestor every day, fasting lipid profile, AST ALT need to be repeated in 6 weeks.  Kidney function actually improved compared to before

## 2020-06-05 NOTE — Telephone Encounter (Signed)
Spoke with patient and reviewed his lab results and recommendations. Advised that I would send in prescription to mail order pharmacy and confirmed follow up appointment in November. He verbalized understanding with no further questions at this time.

## 2020-06-05 NOTE — Telephone Encounter (Signed)
Called patient. Informed him of results advised him to start crestor 10 mg daily and in 6 weeks to have his lab work redrawn. He verbally understood no further questions.

## 2020-06-05 NOTE — Telephone Encounter (Signed)
Patient returning call for lab results. 

## 2020-06-08 ENCOUNTER — Telehealth: Payer: Self-pay | Admitting: Cardiology

## 2020-06-08 NOTE — Telephone Encounter (Signed)
New Message:     Pt wants to know if it will be alright for him to take a 8 hours trip, he will be driving.

## 2020-06-09 ENCOUNTER — Other Ambulatory Visit: Payer: Self-pay

## 2020-06-09 ENCOUNTER — Ambulatory Visit: Payer: Medicare Other | Attending: Student

## 2020-06-09 DIAGNOSIS — M6281 Muscle weakness (generalized): Secondary | ICD-10-CM

## 2020-06-09 DIAGNOSIS — M25652 Stiffness of left hip, not elsewhere classified: Secondary | ICD-10-CM | POA: Insufficient documentation

## 2020-06-09 DIAGNOSIS — M25552 Pain in left hip: Secondary | ICD-10-CM | POA: Insufficient documentation

## 2020-06-09 DIAGNOSIS — R29898 Other symptoms and signs involving the musculoskeletal system: Secondary | ICD-10-CM | POA: Diagnosis present

## 2020-06-09 NOTE — Telephone Encounter (Signed)
Should be fine as we discussed before as long as he takes frequent stops and rest

## 2020-06-09 NOTE — Therapy (Signed)
[address] [place], [place], [address] Phone: [phone]   Fax:  [phone]  Physical Therapy Evaluation  Patient Details  Name: Timothy Moran MRN: [id number] Date of Birth: 1945/12/13 Referring Provider (PT): Theresa Duty [place]   Encounter Date: 06/09/2020   PT End of Session - 06/09/20 1302    Visit Number 1    Number of Visits 12    Date for PT Re-Evaluation 07/21/20    Authorization Type Medicare    Progress Note Due on Visit 10    PT Start Time 0930    PT Stop Time 1015    PT Time Calculation (min) 45 min    Activity Tolerance Patient tolerated treatment well    Behavior During Therapy [facility] for tasks assessed/performed           Past Medical History:  Diagnosis Date   Abnormal echocardiogram 06/07/2019   Arrhythmia    Ascending aortic aneurysm ([place]) 4.2 cm based on CT from December 2020 01/29/2018   4.3 cm by echo in 2019   Benign brain tumor ([place])    Benign neoplasm of brain ([place]) 12/19/2017   Chest pain 12/04/2017   Chronic kidney disease    Stage 3   Chronic kidney disease, stage III (moderate) 12/19/2017   Dizziness 10/11/2019   History of pulmonary embolus (PE)    Hypertension    Left bundle branch block 12/19/2017   Nonsustained ventricular tachycardia ([place]) 10/11/2019   Personal history of pulmonary embolism 12/19/2017   Pulmonary hypertension due to thromboembolism ([place]) 03/09/2018   See echo 12/27/17 with nl PAS vs CTa chest 02/23/18    Sinus bradycardia 12/22/2017   Solitary pulmonary nodule on lung CT 03/08/2018   CT 12/04/17 1.0 x 0.8 x 0.7 cm nodular opacity in the RUL vs not seen 03/16/10 posterior segment of the right upper lobe. Marland KitchenSpirometry 03/08/2018    FEV1 3.86 (108%)  Ratio 96 s prior rx  - PET  03/13/18   Low grade c/w adenoca > rec T surgery eval      Past Surgical History:  Procedure Laterality Date   APPENDECTOMY     HERNIA REPAIR      There were no vitals  filed for this visit.    Subjective Assessment - 06/09/20 0934    Subjective Pt reports he had a hip replacement in 2005 and it was perfect from day 1. Pt began walking 3 months ago and was stretching on a step and thinks he pulled something a couple of weeks ago. He had imaging last Monday, which was negative for anything significant in his hip. Pain onset in side of hip which worried pt about his hip. Sitting to standing after sitting for a bit is irritating, a swell as ambulating. He has to walk "gingerly, being careful not to go too fast or twist". pt does not do any strengthening. Pt is wearing a heart monitor right now because pt has begun to have some dizziness and weakness after negotiating a few steps and having to stop and catch his breath.    Pertinent History L THA 2005, benign brain tumor    Limitations Walking;Other (comment)   stairs, inclines and declines   How long can you walk comfortably? feels pain the entire time    Diagnostic tests XR: negative    Patient Stated Goals I would like to continue walking    Pain Location Hip    Pain Orientation Left  [facility] PT Assessment - 06/09/20 0001      Assessment   Medical Diagnosis Left Hip Pain    Referring Provider (PT) Theresa Duty PA    Onset Date/Surgical Date 05/26/20    Hand Dominance Right    Next MD Visit PRN    Prior Therapy May 2020 R shoulder      Balance Screen   Has the patient fallen in the past 6 months No    Has the patient had a decrease in activity level because of a fear of falling?  Yes   last couple of weeks   Is the patient reluctant to leave their home because of a fear of falling?  No      ROM / Strength   AROM / PROM / Strength AROM;PROM;Strength      AROM   AROM Assessment Site Hip;Knee    Right/Left Hip Left    Left Hip Flexion 104   tender   Right/Left Knee Left    Left Knee Extension 0    Left Knee Flexion 140      PROM   PROM Assessment Site Hip    Right/Left Hip  Left    Left Hip External Rotation  55    Left Hip Internal Rotation  23                      Objective measurements completed on examination: See above findings.               PT Education - 06/09/20 1302    Education Details Diagnosis, Prognosis, POC, HEP    Person(s) Educated Patient    Methods Explanation;Demonstration;Tactile cues;Handout;Verbal cues    Comprehension Tactile cues required;Verbalized understanding;Returned demonstration;Verbal cues required;Need further instruction            PT Short Term Goals - 06/09/20 1303      PT SHORT TERM GOAL #1   Title Pt will be I and compliant with initial HEP.    Time 2    Period Weeks    Status New    Target Date 06/23/20             PT Long Term Goals - 06/09/20 1303      PT LONG TERM GOAL #1   Title Pt will return to walking for 45 minutes as daily exercise without pain.    Time 6    Period Weeks    Status New    Target Date 07/21/20      PT LONG TERM GOAL #2   Title Pt will increase L hip extensor/abductor/ER MMT to 4+/5.    Time 6    Period Weeks    Status New    Target Date 07/21/20      PT LONG TERM GOAL #3   Title Pt will negotiate 1 flight of stairs with no pain, as needed for home access.    Time 6    Period Weeks    Status New    Target Date 07/21/20      PT LONG TERM GOAL #4   Title Pt will demo proper squat form with no pain, for ADLs/IADLs.    Baseline quad dominance, minimal hip hinging, increased lumbar flexion    Time 6    Period Weeks    Status New    Target Date 07/21/20      PT LONG TERM GOAL #5   Title Pt will be I and compliant with long  term HEP for injury prevention.    Time 6    Period Weeks    Status New    Target Date 07/21/20                  Plan - 06/09/20 0949    Clinical Impression Statement Pt is a 74 yo male who presents to OP PT with L hip pain following instance of overstretching 2 weeks ago. Pt is currently being monitored for  potential heart issue and wearing a monitor. He did no c/o dizziness of weakness during evaluation (reports this some recently). He has history of a L THA in 2005 with no problems until now. Radiographs were negative. Pt demo impairments in posture, body mechanics in squatting with overuse of low back and knees, weakness in L hip ER/ABD/EXT, pain with AROM in L hip, and stiffness. Pt was educated on diagnosis, prognosis, POC and HEP verbalizing understanding and consent. Pt will benefit from skilled PT 2x/week for 6 weeks to address impairments and restore PLOF.    Personal Factors and Comorbidities Comorbidity 3+;Past/Current Experience;Time since onset of injury/illness/exacerbation;Age    Comorbidities Heart Arrhythmia, HTN, Kidney Disease    Examination-Activity Limitations Squat;Lift;Locomotion Level;Stairs    Examination-Participation Restrictions Interpersonal Relationship;Community Activity    Stability/Clinical Decision Making Evolving/Moderate complexity    Clinical Decision Making Moderate    Rehab Potential Good    PT Frequency 2x / week    PT Duration 6 weeks    PT Treatment/Interventions ADLs/Self Care Home Management;Electrical Stimulation;Cryotherapy;Iontophoresis 4mg /ml Dexamethasone;Moist Heat;Therapeutic exercise;Neuromuscular re-education;Patient/family education;Dry needling;Manual techniques;Vasopneumatic Device;Passive range of motion;Functional mobility training;Stair training;Gait training;Therapeutic activities    PT Next Visit Plan FOTO Hip, assess HEP, progress L hip flexibility and strength, manual as indicated    PT Home Exercise Plan [id number]: PPT, bridge, H/L Clams c RTB, Clams c RTB    Consulted and Agree with Plan of Care Patient           Patient will benefit from skilled therapeutic intervention in order to improve the following deficits and impairments:  Pain, Postural dysfunction, Increased fascial restricitons, Impaired flexibility, Decreased strength,  Decreased activity tolerance, Decreased range of motion, Impaired perceived functional ability, Improper body mechanics, Hypomobility, Difficulty walking, Decreased endurance  Visit Diagnosis: Pain in left hip - Plan: PT plan of care cert/re-cert  Stiffness of left hip, not elsewhere classified - Plan: PT plan of care cert/re-cert  Muscle weakness (generalized) - Plan: PT plan of care cert/re-cert  Poor body mechanics - Plan: PT plan of care cert/re-cert     Problem List Patient Active Problem List   Diagnosis Date Noted   Benign brain tumor [facility])    Chronic kidney disease    History of pulmonary embolus (PE)    Hypertension    Nonsustained ventricular tachycardia ([place]) 10/11/2019   Dizziness 10/11/2019   Abnormal echocardiogram 06/07/2019   Pulmonary hypertension due to thromboembolism ([place]) 03/09/2018   Solitary pulmonary nodule on lung CT 03/08/2018   Ascending aortic aneurysm (HCC) 4.2 cm based on CT from December 2020 01/29/2018   Sinus bradycardia 12/22/2017   Left bundle branch block 12/19/2017   Personal history of pulmonary embolism 12/19/2017   Chronic kidney disease, stage III (moderate) 12/19/2017   Benign neoplasm of brain ([place]) 12/19/2017   Chest pain 12/04/2017    Izell [place], PT, DPT 06/09/2020, 2:10 PM  [address] [place], [place], [address] Phone: [phone]   Fax:  [phone]  Name: Timothy Moran MRN: [id number] Date of Birth: [birth year]

## 2020-06-09 NOTE — Telephone Encounter (Signed)
Patient called to check status of call.

## 2020-06-09 NOTE — Telephone Encounter (Signed)
Called patient informed him of Dr. Wendy Poet note. No further questions.

## 2020-06-11 ENCOUNTER — Ambulatory Visit: Payer: Medicare Other | Admitting: Cardiology

## 2020-06-16 ENCOUNTER — Telehealth: Payer: Self-pay | Admitting: Cardiology

## 2020-06-16 NOTE — Telephone Encounter (Signed)
Called patient back. Informed him there are now new results yet once monitor results come in we will call him with them.

## 2020-06-16 NOTE — Telephone Encounter (Signed)
Patient states he is returning a call for results.

## 2020-06-17 ENCOUNTER — Other Ambulatory Visit: Payer: Self-pay

## 2020-06-17 ENCOUNTER — Ambulatory Visit: Payer: Medicare Other | Attending: Student | Admitting: Physical Therapy

## 2020-06-17 ENCOUNTER — Ambulatory Visit: Payer: Medicare Other | Admitting: Cardiology

## 2020-06-17 ENCOUNTER — Encounter: Payer: Self-pay | Admitting: Physical Therapy

## 2020-06-17 DIAGNOSIS — M25652 Stiffness of left hip, not elsewhere classified: Secondary | ICD-10-CM

## 2020-06-17 DIAGNOSIS — M25552 Pain in left hip: Secondary | ICD-10-CM | POA: Diagnosis present

## 2020-06-17 DIAGNOSIS — R29898 Other symptoms and signs involving the musculoskeletal system: Secondary | ICD-10-CM | POA: Insufficient documentation

## 2020-06-17 DIAGNOSIS — M6281 Muscle weakness (generalized): Secondary | ICD-10-CM

## 2020-06-17 NOTE — Therapy (Signed)
[place], [place], [address] Phone: [phone]   Fax:  [phone]  Physical Therapy Treatment  Patient Details  Name: Timothy Moran MRN: [id number] Date of Birth: 1946-08-26 Referring Provider (PT): Theresa Duty [place]   Encounter Date: 06/17/2020   PT End of Session - 06/17/20 0923    Visit Number 2    Number of Visits 12    Authorization Type Medicare    PT Start Time 0840    PT Stop Time 0923    PT Time Calculation (min) 43 min    Activity Tolerance Patient tolerated treatment well;Patient limited by fatigue    Behavior During Therapy [facility] for tasks assessed/performed           Past Medical History:  Diagnosis Date  . Abnormal echocardiogram 06/07/2019  . Arrhythmia   . Ascending aortic aneurysm (HCC) 4.2 cm based on CT from December 2020 01/29/2018   4.3 cm by echo in 2019  . Benign brain tumor ([place])   . Benign neoplasm of brain ([place]) 12/19/2017  . Chest pain 12/04/2017  . Chronic kidney disease    Stage 3  . Chronic kidney disease, stage III (moderate) ([place]) 12/19/2017  . Dizziness 10/11/2019  . History of pulmonary embolus (PE)   . Hypertension   . Left bundle branch block 12/19/2017  . Nonsustained ventricular tachycardia ([place]) 10/11/2019  . Personal history of pulmonary embolism 12/19/2017  . Pulmonary hypertension due to thromboembolism ([place]) 03/09/2018   See echo 12/27/17 with nl PAS vs CTa chest 02/23/18   . Sinus bradycardia 12/22/2017  . Solitary pulmonary nodule on lung CT 03/08/2018   CT 12/04/17 1.0 x 0.8 x 0.7 cm nodular opacity in the RUL vs not seen 03/16/10 posterior segment of the right upper lobe. Marland KitchenSpirometry 03/08/2018    FEV1 3.86 (108%)  Ratio 96 s prior rx  - PET  03/13/18   Low grade c/w adenoca > rec T surgery eval      Past Surgical History:  Procedure Laterality Date  . APPENDECTOMY    . HERNIA REPAIR      There were no vitals filed for this visit.   Subjective  Assessment - 06/17/20 0843    Subjective Pt reports that his hip is better overall, Main issues not is dizziness with exertion    Currently in Pain? No/denies                             [facility] Adult PT Treatment/Exercise - 06/17/20 0001      Exercises   Exercises Knee/Hip      Knee/Hip Exercises: Aerobic   Nustep L4 x 6 min       Knee/Hip Exercises: Machines for Strengthening   Cybex Knee Extension 10lb 2x10     Cybex Knee Flexion 25lb 2x10       Knee/Hip Exercises: Standing   Hip Flexion Both;2 sets;10 reps;Knee bent    Hip Abduction Both;2 sets;10 reps;Knee straight   HHA x2   Hip Extension 10 reps;Knee straight;Stengthening;2 sets    Forward Step Up 2 sets;Both;10 reps;Hand Hold: 1;Step Height: 4"      Knee/Hip Exercises: Seated   Ball Squeeze 2x10 3 sec hold     Sit to Sand 2 sets;5 reps;without UE support   from elevated mat table  PT Short Term Goals - 06/17/20 5997      PT SHORT TERM GOAL #1   Title Pt will be I and compliant with initial HEP.    Status Achieved             PT Long Term Goals - 06/17/20 7414      PT LONG TERM GOAL #1   Title Pt will return to walking for 45 minutes as daily exercise without pain.    Status On-going      PT LONG TERM GOAL #2   Title Pt will increase L hip extensor/abductor/ER MMT to 4+/5.    Status On-going                 Plan - 06/17/20 2395    Clinical Impression Statement Pt able to complete all the interventions. $ inch step ups was really taxing on patient requiring a length rest break afterwards. He seems to become unsteady after standing from sitting. SpO2 checked throughout session 94% -97%. Cues to complete the full ROM with curls and extensions.    Personal Factors and Comorbidities Comorbidity 3+;Past/Current Experience;Time since onset of injury/illness/exacerbation;Age    Comorbidities Heart Arrhythmia, HTN, Kidney Disease    Examination-Activity  Limitations Squat;Lift;Locomotion Level;Stairs    Examination-Participation Restrictions Interpersonal Relationship;Community Activity    Stability/Clinical Decision Making Evolving/Moderate complexity    Rehab Potential Good    PT Frequency 2x / week    PT Duration 6 weeks    PT Treatment/Interventions ADLs/Self Care Home Management;Electrical Stimulation;Cryotherapy;Iontophoresis 4mg /ml Dexamethasone;Moist Heat;Therapeutic exercise;Neuromuscular re-education;Patient/family education;Dry needling;Manual techniques;Vasopneumatic Device;Passive range of motion;Functional mobility training;Stair training;Gait training;Therapeutic activities    PT Next Visit Plan FOTO Hip,  progress L hip flexibility and strength, manual as indicated           Patient will benefit from skilled therapeutic intervention in order to improve the following deficits and impairments:  Pain, Postural dysfunction, Increased fascial restricitons, Impaired flexibility, Decreased strength, Decreased activity tolerance, Decreased range of motion, Impaired perceived functional ability, Improper body mechanics, Hypomobility, Difficulty walking, Decreased endurance  Visit Diagnosis: Stiffness of left hip, not elsewhere classified  Muscle weakness (generalized)  Pain in left hip     Problem List Patient Active Problem List   Diagnosis Date Noted  . Benign brain tumor ([place])   . Chronic kidney disease   . History of pulmonary embolus (PE)   . Hypertension   . Nonsustained ventricular tachycardia ([place]) 10/11/2019  . Dizziness 10/11/2019  . Abnormal echocardiogram 06/07/2019  . Pulmonary hypertension due to thromboembolism ([place]) 03/09/2018  . Solitary pulmonary nodule on lung CT 03/08/2018  . Ascending aortic aneurysm (HCC) 4.2 cm based on CT from December 2020 01/29/2018  . Sinus bradycardia 12/22/2017  . Left bundle branch block 12/19/2017  . Personal history of pulmonary embolism 12/19/2017  . Chronic kidney  disease, stage III (moderate) ([place]) 12/19/2017  . Benign neoplasm of brain ([place]) 12/19/2017  . Chest pain 12/04/2017    Scot Jun, PTA 06/17/2020, 9:26 AM  [place] [place], [place], [address] Phone: [phone]   Fax:  [phone]  Name: Timothy Moran MRN: [id number] Date of Birth: 1946/08/27

## 2020-06-19 ENCOUNTER — Ambulatory Visit (HOSPITAL_BASED_OUTPATIENT_CLINIC_OR_DEPARTMENT_OTHER)
Admission: RE | Admit: 2020-06-19 | Discharge: 2020-06-19 | Disposition: A | Discharge status: Home / Self Care | Payer: Medicare Other | Source: Ambulatory Visit | Attending: Cardiology | Admitting: Cardiology

## 2020-06-19 ENCOUNTER — Encounter: Payer: Self-pay | Admitting: Cardiology

## 2020-06-19 ENCOUNTER — Other Ambulatory Visit: Payer: Self-pay

## 2020-06-19 ENCOUNTER — Ambulatory Visit (INDEPENDENT_AMBULATORY_CARE_PROVIDER_SITE_OTHER): Payer: Medicare Other | Admitting: Cardiology

## 2020-06-19 VITALS — BP 160/88 | HR 70 | Ht 73.5 in | Wt 224.0 lb

## 2020-06-19 DIAGNOSIS — R42 Dizziness and giddiness: Secondary | ICD-10-CM

## 2020-06-19 DIAGNOSIS — R002 Palpitations: Secondary | ICD-10-CM | POA: Diagnosis present

## 2020-06-19 DIAGNOSIS — R0600 Dyspnea, unspecified: Secondary | ICD-10-CM

## 2020-06-19 DIAGNOSIS — R001 Bradycardia, unspecified: Secondary | ICD-10-CM | POA: Insufficient documentation

## 2020-06-19 DIAGNOSIS — Z86711 Personal history of pulmonary embolism: Secondary | ICD-10-CM

## 2020-06-19 DIAGNOSIS — I7121 Aneurysm of the ascending aorta, without rupture: Secondary | ICD-10-CM

## 2020-06-19 DIAGNOSIS — I10 Essential (primary) hypertension: Secondary | ICD-10-CM

## 2020-06-19 DIAGNOSIS — I447 Left bundle-branch block, unspecified: Secondary | ICD-10-CM | POA: Diagnosis not present

## 2020-06-19 DIAGNOSIS — R0609 Other forms of dyspnea: Secondary | ICD-10-CM

## 2020-06-19 DIAGNOSIS — I712 Thoracic aortic aneurysm, without rupture: Secondary | ICD-10-CM | POA: Diagnosis not present

## 2020-06-19 DIAGNOSIS — R079 Chest pain, unspecified: Secondary | ICD-10-CM

## 2020-06-19 MED ORDER — APIXABAN 5 MG PO TABS: 5.0000 mg | ORAL_TABLET | Freq: Two times a day (BID) | ORAL | 2 refills | Status: DC

## 2020-06-19 NOTE — Patient Instructions (Signed)
Medication Instructions:  Your physician has recommended you make the following change in your medication:   START: Eliquis 5 mg twice daily  *If you need a refill on your cardiac medications before your next appointment, please call your pharmacy*   Lab Work: Your physician recommends that you return for lab work today: cbc, cmp, ddimer   If you have labs (blood work) drawn today and your tests are completely normal, you will receive your results only by: Marland Kitchen MyChart Message (if you have MyChart) OR . A paper copy in the mail If you have any lab test that is abnormal or we need to change your treatment, we will call you to review the results.   Testing/Procedures: You will have a VQ scan.    Follow-Up: At [facility], you and your health needs are our priority.  As part of our continuing mission to provide you with exceptional heart care, we have created designated Provider Care Teams.  These Care Teams include your primary Cardiologist (physician) and Advanced Practice Providers (APPs -  Physician Assistants and Nurse Practitioners) who all work together to provide you with the care you need, when you need it.  We recommend signing up for the patient portal called "MyChart".  Sign up information is provided on this After Visit Summary.  MyChart is used to connect with patients for Virtual Visits (Telemedicine).  Patients are able to view lab/test results, encounter notes, upcoming appointments, etc.  Non-urgent messages can be sent to your provider as well.   To learn more about what you can do with MyChart, go to [email or web address].    Your next appointment:   1 month(s)  The format for your next appointment:   In Person  Provider:   Jenne Campus, MD   Other Instructions  Apixaban oral tablets What is this medicine? APIXABAN (a PIX a ban) is an anticoagulant (blood thinner). It is used to lower the chance of stroke in people with a medical condition called atrial  fibrillation. It is also used to treat or prevent blood clots in the lungs or in the veins. This medicine may be used for other purposes; ask your health care provider or pharmacist if you have questions. COMMON BRAND NAME(S): Eliquis What should I tell my health care provider before I take this medicine? They need to know if you have any of these conditions:  antiphospholipid antibody syndrome  bleeding disorders  bleeding in the brain  blood in your stools (black or tarry stools) or if you have blood in your vomit  history of blood clots  history of stomach bleeding  kidney disease  liver disease  mechanical heart valve  an unusual or allergic reaction to apixaban, other medicines, foods, dyes, or preservatives  pregnant or trying to get pregnant  breast-feeding How should I use this medicine? Take this medicine by mouth with a glass of water. Follow the directions on the prescription label. You can take it with or without food. If it upsets your stomach, take it with food. Take your medicine at regular intervals. Do not take it more often than directed. Do not stop taking except on your doctor's advice. Stopping this medicine may increase your risk of a blood clot. Be sure to refill your prescription before you run out of medicine. Talk to your pediatrician regarding the use of this medicine in children. Special care may be needed. Overdosage: If you think you have taken too much of this medicine contact a poison control center  or emergency room at once. NOTE: This medicine is only for you. Do not share this medicine with others. What if I miss a dose? If you miss a dose, take it as soon as you can. If it is almost time for your next dose, take only that dose. Do not take double or extra doses. What may interact with this medicine? This medicine may interact with the following:  aspirin and aspirin-like medicines  certain medicines for fungal infections like ketoconazole  and itraconazole  certain medicines for seizures like carbamazepine and phenytoin  certain medicines that treat or prevent blood clots like warfarin, enoxaparin, and dalteparin  clarithromycin  NSAIDs, medicines for pain and inflammation, like ibuprofen or naproxen  rifampin  ritonavir  St. John's wort This list may not describe all possible interactions. Give your health care provider a list of all the medicines, herbs, non-prescription drugs, or dietary supplements you use. Also tell them if you smoke, drink alcohol, or use illegal drugs. Some items may interact with your medicine. What should I watch for while using this medicine? Visit your healthcare professional for regular checks on your progress. You may need blood work done while you are taking this medicine. Your condition will be monitored carefully while you are receiving this medicine. It is important not to miss any appointments. Avoid sports and activities that might cause injury while you are using this medicine. Severe falls or injuries can cause unseen bleeding. Be careful when using sharp tools or knives. Consider using an Copy. Take special care brushing or flossing your teeth. Report any injuries, bruising, or red spots on the skin to your healthcare professional. If you are going to need surgery or other procedure, tell your healthcare professional that you are taking this medicine. Wear a medical ID bracelet or chain. Carry a card that describes your disease and details of your medicine and dosage times. What side effects may I notice from receiving this medicine? Side effects that you should report to your doctor or health care professional as soon as possible:  allergic reactions like skin rash, itching or hives, swelling of the face, lips, or tongue  signs and symptoms of bleeding such as bloody or black, tarry stools; red or dark-brown urine; spitting up blood or brown material that looks like coffee  grounds; red spots on the skin; unusual bruising or bleeding from the eye, gums, or nose  signs and symptoms of a blood clot such as chest pain; shortness of breath; pain, swelling, or warmth in the leg  signs and symptoms of a stroke such as changes in vision; confusion; trouble speaking or understanding; severe headaches; sudden numbness or weakness of the face, arm or leg; trouble walking; dizziness; loss of coordination This list may not describe all possible side effects. Call your doctor for medical advice about side effects. You may report side effects to FDA at 1-800-FDA-1088. Where should I keep my medicine? Keep out of the reach of children. Store at room temperature between 20 and 25 degrees C (68 and 77 degrees F). Throw away any unused medicine after the expiration date. NOTE: This sheet is a summary. It may not cover all possible information. If you have questions about this medicine, talk to your doctor, pharmacist, or health care provider.  2020 Elsevier/Gold Standard (2018-05-09 17:39:34)

## 2020-06-19 NOTE — Progress Notes (Signed)
Cardiology Office Note:    Date:  06/19/2020   ID:  Timothy Moran, DOB Nov 22, 1945, MRN [id number]  PCP:  Cari Caraway, MD  Cardiologist:  Jenne Campus, MD    Referring MD: Cari Caraway, MD   No chief complaint on file. I am short of breath  History of Present Illness:    Timothy Moran is a 74 y.o. male with past medical history significant for pulmonary emboli that he suffered from few years ago.  It was felt to be provoked, however when he ask his cardiologist to stop Eliquis and his cardiologist had some reservations.  Apparently hypercoagulability work-up has been done which was negative and his Eliquis has been withdrawn.  He has been experiencing progressive shortness of breath for last few weeks.  He used to walk under the bases with no difficulty now it became very difficult for him including he is having some dizziness.  He recently was in [place] he drove back from [place] on Monday when he was driving he had some difficulties he stopped only 1 time he complained of not feeling well he also complained of having some double vision when it happened.  Denies having any chest pain tightness squeezing pressure burning chest except for some atypical tingling in front of his left chest, no pain in his legs.  Past Medical History:  Diagnosis Date  . Abnormal echocardiogram 06/07/2019  . Arrhythmia   . Ascending aortic aneurysm (HCC) 4.2 cm based on CT from December 2020 01/29/2018   4.3 cm by echo in 2019  . Benign brain tumor ([place])   . Benign neoplasm of brain ([place]) 12/19/2017  . Chest pain 12/04/2017  . Chronic kidney disease    Stage 3  . Chronic kidney disease, stage III (moderate) ([place]) 12/19/2017  . Dizziness 10/11/2019  . History of pulmonary embolus (PE)   . Hypertension   . Left bundle branch block 12/19/2017  . Nonsustained ventricular tachycardia ([place]) 10/11/2019  . Personal history of pulmonary embolism 12/19/2017  . Pulmonary hypertension due to thromboembolism ([place])  03/09/2018   See echo 12/27/17 with nl PAS vs CTa chest 02/23/18   . Sinus bradycardia 12/22/2017  . Solitary pulmonary nodule on lung CT 03/08/2018   CT 12/04/17 1.0 x 0.8 x 0.7 cm nodular opacity in the RUL vs not seen 03/16/10 posterior segment of the right upper lobe. Marland KitchenSpirometry 03/08/2018    FEV1 3.86 (108%)  Ratio 96 s prior rx  - PET  03/13/18   Low grade c/w adenoca > rec T surgery eval      Past Surgical History:  Procedure Laterality Date  . APPENDECTOMY    . HERNIA REPAIR      Current Medications: Current Meds  Medication Sig  . aspirin EC 81 MG tablet Take 81 mg by mouth every other day. Swallow whole.  . Azelastine HCl 137 MCG/SPRAY SOLN Place 1 spray into both nostrils at bedtime.  . B Complex-C (SUPER B COMPLEX PO) Take 1 tablet by mouth daily.  . Cholecalciferol (VITAMIN D3) 5000 units TABS Take 1 tablet by mouth daily.   . finasteride (PROSCAR) 5 MG tablet Take 5 mg by mouth daily.  . Magnesium 400 MG CAPS Take 400 mg by mouth daily.   . nitroGLYCERIN (NITROSTAT) 0.4 MG SL tablet Place 1 tablet (0.4 mg total) under the tongue every 5 (five) minutes as needed for chest pain.  . Omega-3 Fatty Acids (FISH OIL) 1360 MG CAPS Take 2 capsules by mouth daily.  Marland Kitchen  rosuvastatin (CRESTOR) 10 MG tablet Take 1 tablet (10 mg total) by mouth daily.  . tamsulosin (FLOMAX) 0.4 MG CAPS capsule Take 2 capsules by mouth daily.   . valsartan (DIOVAN) 160 MG tablet TAKE 1 TABLET DAILY     Allergies:   Patient has no known allergies.   Social History   Socioeconomic History  . Marital status: Married    Spouse name: Not on file  . Number of children: Not on file  . Years of education: Not on file  . Highest education level: Not on file  Occupational History  . Not on file  Tobacco Use  . Smoking status: Former Smoker    Packs/day: 0.50    Years: 5.00    Pack years: 2.50    Types: Cigarettes    Quit date: 09/12/1968    Years since quitting: 51.8  . Smokeless tobacco: Never Used  Vaping  Use  . Vaping Use: Never used  Substance and Sexual Activity  . Alcohol use: Yes    Comment: ONE PER WEEK  . Drug use: Never  . Sexual activity: Not on file  Other Topics Concern  . Not on file  Social History Narrative  . Not on file   Social Determinants of Health   Financial Resource Strain:   . Difficulty of Paying Living Expenses: Not on file  Food Insecurity:   . Worried About Charity fundraiser in the Last Year: Not on file  . Ran Out of Food in the Last Year: Not on file  Transportation Needs:   . Lack of Transportation (Medical): Not on file  . Lack of Transportation (Non-Medical): Not on file  Physical Activity:   . Days of Exercise per Week: Not on file  . Minutes of Exercise per Session: Not on file  Stress:   . Feeling of Stress : Not on file  Social Connections:   . Frequency of Communication with Friends and Family: Not on file  . Frequency of Social Gatherings with Friends and Family: Not on file  . Attends Religious Services: Not on file  . Active Member of Clubs or Organizations: Not on file  . Attends Archivist Meetings: Not on file  . Marital Status: Not on file     Family History: The patient's family history includes Brain cancer in his sister; Diabetes in his mother; Leukemia in his brother; Melanoma in his brother. ROS:   Please see the history of present illness.    All 14 point review of systems negative except as described per history of present illness  EKGs/Labs/Other Studies Reviewed:      Recent Labs: 06/01/2020: BUN 20; Creatinine, Ser 1.51; Potassium 4.6; Sodium 139  Recent Lipid Panel    Component Value Date/Time   CHOL 247 (H) 06/01/2020 0858   TRIG 338 (H) 06/01/2020 0858   HDL 33 (L) 06/01/2020 0858   CHOLHDL 7.5 (H) 06/01/2020 0858   LDLCALC 151 (H) 06/01/2020 0858    Physical Exam:    VS:  BP (!) 160/88   Pulse 70   Ht 6' 1.5" (1.867 m)   Wt 224 lb (101.6 kg)   SpO2 91%   BMI 29.15 kg/m     Wt  Readings from Last 3 Encounters:  06/19/20 224 lb (101.6 kg)  06/01/20 222 lb 1.3 oz (100.7 kg)  12/06/19 223 lb 8 oz (101.4 kg)     GEN:  Well nourished, well developed in no acute distress HEENT: Normal NECK:  No JVD; No carotid bruits LYMPHATICS: No lymphadenopathy CARDIAC: RRR, no murmurs, no rubs, no gallops RESPIRATORY:  Clear to auscultation without rales, wheezing or rhonchi  ABDOMEN: Soft, non-tender, non-distended MUSCULOSKELETAL:  No edema; No deformity  SKIN: Warm and dry LOWER EXTREMITIES: no swelling NEUROLOGIC:  Alert and oriented x 3 PSYCHIATRIC:  Normal affect   ASSESSMENT:    1. Ascending aortic aneurysm (HCC) 4.2 cm based on CT from December 2020   2. Left bundle branch block   3. Primary hypertension   4. History of pulmonary embolus (PE)   5. Dizziness    PLAN:    In order of problems listed above:  1. Dyspnea on exertion in this gentleman with history of pulmonary emboli that was assessed as provoked.  I am worried about him.  He is does have pretty decent saturation with no critical drop with exercises but he clearly does have some shortness of breath.  I want him to be evaluated for PE.  Because of his kidney dysfunction we will go for VQ scan.  I will also put him on anticoagulation.  I will start Eliquis 5 mg twice daily we will do CBC proBNP today.  D-dimer will be done as well. 2. Primary hypertension blood pressure seems to be elevated today but he is excited and upset about this.  I will continue monitoring. 3. Dizziness, multifactorial.  I am worried about pulmonary emboli that need to be ruled out first.  I told him if shortness of breath get worse he need to go to the emergency room. 4. History of left bundle branch block with mild diminished ejection fraction echocardiogram will be done today.   Medication Adjustments/Labs and Tests Ordered: Current medicines are reviewed at length with the patient today.  Concerns regarding medicines are  outlined above.  No orders of the defined types were placed in this encounter.  Medication changes: No orders of the defined types were placed in this encounter.   Signed, Park Liter, MD, [facility] 06/19/2020 10:01 AM    [place]

## 2020-06-20 LAB — CBC
Hematocrit: 45.3 % (ref 37.5–51.0)
Hemoglobin: 15.4 g/dL (ref 13.0–17.7)
MCH: 30.8 pg (ref 26.6–33.0)
MCHC: 34 g/dL (ref 31.5–35.7)
MCV: 91 fL (ref 79–97)
Platelets: 245 10*3/uL (ref 150–450)
RBC: 5 x10E6/uL (ref 4.14–5.80)
RDW: 13.2 % (ref 11.6–15.4)
WBC: 8.3 10*3/uL (ref 3.4–10.8)

## 2020-06-20 LAB — COMPREHENSIVE METABOLIC PANEL
ALT: 30 IU/L (ref 0–44)
AST: 31 IU/L (ref 0–40)
Albumin/Globulin Ratio: 1.7 (ref 1.2–2.2)
Albumin: 4.7 g/dL (ref 3.7–4.7)
Alkaline Phosphatase: 71 IU/L (ref 44–121)
BUN/Creatinine Ratio: 12 (ref 10–24)
BUN: 16 mg/dL (ref 8–27)
Bilirubin Total: 0.4 mg/dL (ref 0.0–1.2)
CO2: 22 mmol/L (ref 20–29)
Calcium: 9.8 mg/dL (ref 8.6–10.2)
Chloride: 105 mmol/L (ref 96–106)
Creatinine, Ser: 1.38 mg/dL — ABNORMAL HIGH (ref 0.76–1.27)
GFR calc Af Amer: 57 mL/min/{1.73_m2} — ABNORMAL LOW (ref >59)
GFR calc non Af Amer: 49 mL/min/{1.73_m2} — ABNORMAL LOW (ref >59)
Globulin, Total: 2.8 g/dL (ref 1.5–4.5)
Glucose: 121 mg/dL — ABNORMAL HIGH (ref 65–99)
Potassium: 4.6 mmol/L (ref 3.5–5.2)
Sodium: 142 mmol/L (ref 134–144)
Total Protein: 7.5 g/dL (ref 6.0–8.5)

## 2020-06-20 LAB — D-DIMER, QUANTITATIVE: D-DIMER: 7.81 mg/L FEU — ABNORMAL HIGH (ref 0.00–0.49)

## 2020-06-20 LAB — ECHOCARDIOGRAM COMPLETE
Area-P 1/2: 2.03 cm2
Height: 73.5 in
P 1/2 time: 576 msec
S' Lateral: 3.54 cm
Weight: 3584 oz

## 2020-06-22 ENCOUNTER — Telehealth: Payer: Self-pay | Admitting: Emergency Medicine

## 2020-06-22 NOTE — Telephone Encounter (Signed)
Called patient informed him of results of echo, labs and monitor. Patient wanted to know Dr. Wendy Poet thoughts on fish oil causing dizziness ? HE has read this somewhere and wanted to know what [name] thought. Informed him [name] is off but I will ask him when he returns. He verbally understood. No further questions.

## 2020-06-23 ENCOUNTER — Other Ambulatory Visit: Payer: Self-pay

## 2020-06-23 ENCOUNTER — Encounter: Payer: Self-pay | Admitting: Physical Therapy

## 2020-06-23 ENCOUNTER — Ambulatory Visit: Payer: Medicare Other | Admitting: Physical Therapy

## 2020-06-23 DIAGNOSIS — M25652 Stiffness of left hip, not elsewhere classified: Secondary | ICD-10-CM

## 2020-06-23 DIAGNOSIS — M6281 Muscle weakness (generalized): Secondary | ICD-10-CM

## 2020-06-23 DIAGNOSIS — M25552 Pain in left hip: Secondary | ICD-10-CM

## 2020-06-23 NOTE — Therapy (Signed)
[place] [place], [place], [address] Phone: [phone]   Fax:  [phone]  Physical Therapy Treatment  Patient Details  Name: Timothy Moran MRN: [id number] Date of Birth: 1946-01-10 Referring Provider (PT): Theresa Duty [place]   Encounter Date: 06/23/2020   PT End of Session - 06/23/20 1012    Visit Number 3    Date for PT Re-Evaluation 07/21/20    Authorization Type Medicare    PT Start Time 0930    PT Stop Time 1013    PT Time Calculation (min) 43 min    Activity Tolerance Patient tolerated treatment well;Patient limited by fatigue    Behavior During Therapy [facility] for tasks assessed/performed           Past Medical History:  Diagnosis Date  . Abnormal echocardiogram 06/07/2019  . Arrhythmia   . Ascending aortic aneurysm (HCC) 4.2 cm based on CT from December 2020 01/29/2018   4.3 cm by echo in 2019  . Benign brain tumor ([place])   . Benign neoplasm of brain ([place]) 12/19/2017  . Chest pain 12/04/2017  . Chronic kidney disease    Stage 3  . Chronic kidney disease, stage III (moderate) ([place]) 12/19/2017  . Dizziness 10/11/2019  . History of pulmonary embolus (PE)   . Hypertension   . Left bundle branch block 12/19/2017  . Nonsustained ventricular tachycardia ([place]) 10/11/2019  . Personal history of pulmonary embolism 12/19/2017  . Pulmonary hypertension due to thromboembolism ([place]) 03/09/2018   See echo 12/27/17 with nl PAS vs CTa chest 02/23/18   . Sinus bradycardia 12/22/2017  . Solitary pulmonary nodule on lung CT 03/08/2018   CT 12/04/17 1.0 x 0.8 x 0.7 cm nodular opacity in the RUL vs not seen 03/16/10 posterior segment of the right upper lobe. [place] KitchenSpirometry 03/08/2018    FEV1 3.86 (108%)  Ratio 96 s prior rx  - PET  03/13/18   Low grade c/w adenoca > rec T surgery eval      Past Surgical History:  Procedure Laterality Date  . APPENDECTOMY    . HERNIA REPAIR      There were no vitals filed for this visit.    Subjective Assessment - 06/23/20 0931    Subjective Doing ok    Currently in Pain? No/denies                             [facility] Adult PT Treatment/Exercise - 06/23/20 0001      Knee/Hip Exercises: Aerobic   Recumbent Bike L1 x 4 min     Nustep L4 x 6 min       Knee/Hip Exercises: Machines for Strengthening   Cybex Leg Press 40lb 2x10       Knee/Hip Exercises: Standing   Hip Abduction Both;2 sets;10 reps;Knee straight    Abduction Limitations 2lb    Forward Step Up Both;2 sets;10 reps;Hand Hold: 1;Step Height: 6"   at stairs    Walking with Sports Cord 30lb 4 way x 3 each     Other Standing Knee Exercises Standing march 2lb 2x10                     PT Short Term Goals - 06/17/20 9622      PT SHORT TERM GOAL #1   Title Pt will be I and compliant with initial HEP.    Status Achieved  PT Long Term Goals - 06/17/20 3254      PT LONG TERM GOAL #1   Title Pt will return to walking for 45 minutes as daily exercise without pain.    Status On-going      PT LONG TERM GOAL #2   Title Pt will increase L hip extensor/abductor/ER MMT to 4+/5.    Status On-going                 Plan - 06/23/20 1012    Clinical Impression Statement Pt did well today, added additional aerobic warm up without issue. Progressed to leg press with some weakness. Some instability with resisted gait. Tactile cues needed for pt not to sway with hip abduction. Cues to increase hip flex with marches.    Personal Factors and Comorbidities Comorbidity 3+;Past/Current Experience;Time since onset of injury/illness/exacerbation;Age    Comorbidities Heart Arrhythmia, HTN, Kidney Disease    Examination-Activity Limitations Squat;Lift;Locomotion Level;Stairs    Examination-Participation Restrictions Interpersonal Relationship;Community Activity    Stability/Clinical Decision Making Evolving/Moderate complexity    Rehab Potential Good    PT Frequency 2x / week    PT  Duration 6 weeks    PT Treatment/Interventions ADLs/Self Care Home Management;Electrical Stimulation;Cryotherapy;Iontophoresis 4mg /ml Dexamethasone;Moist Heat;Therapeutic exercise;Neuromuscular re-education;Patient/family education;Dry needling;Manual techniques;Vasopneumatic Device;Passive range of motion;Functional mobility training;Stair training;Gait training;Therapeutic activities    PT Next Visit Plan FOTO Hip,  progress L hip flexibility and strength, manual as indicated           Patient will benefit from skilled therapeutic intervention in order to improve the following deficits and impairments:  Pain, Postural dysfunction, Increased fascial restricitons, Impaired flexibility, Decreased strength, Decreased activity tolerance, Decreased range of motion, Impaired perceived functional ability, Improper body mechanics, Hypomobility, Difficulty walking, Decreased endurance  Visit Diagnosis: Stiffness of left hip, not elsewhere classified  Muscle weakness (generalized)  Pain in left hip     Problem List Patient Active Problem List   Diagnosis Date Noted  . Benign brain tumor ([place])   . Chronic kidney disease   . History of pulmonary embolus (PE)   . Hypertension   . Nonsustained ventricular tachycardia ([place]) 10/11/2019  . Dizziness 10/11/2019  . Abnormal echocardiogram 06/07/2019  . Pulmonary hypertension due to thromboembolism ([place]) 03/09/2018  . Solitary pulmonary nodule on lung CT 03/08/2018  . Ascending aortic aneurysm (HCC) 4.2 cm based on CT from December 2020 01/29/2018  . Sinus bradycardia 12/22/2017  . Left bundle branch block 12/19/2017  . Personal history of pulmonary embolism 12/19/2017  . Chronic kidney disease, stage III (moderate) ([place]) 12/19/2017  . Benign neoplasm of brain ([place]) 12/19/2017  . Chest pain 12/04/2017    Scot Jun, PTA 06/23/2020, 10:14 AM  [place]. [place], [place], [address] Phone: [phone]   Fax:  [phone]  Name: Timothy Moran MRN: [id number] Date of Birth: 19-Sep-1945

## 2020-06-23 NOTE — Telephone Encounter (Signed)
I doubt very much that fish oil is responsible for all his symptoms.  Please continue anticoagulation lets wait for VQ scan which I understand this will be done in 2 days

## 2020-06-24 NOTE — Telephone Encounter (Signed)
Called patient. Informed him that [facility] does not think the dizziness is related to fish oil. He verbally understood. Confirmed patient aware appointment for VQ scan is tomorrow. No further questions.

## 2020-06-25 ENCOUNTER — Other Ambulatory Visit: Payer: Self-pay

## 2020-06-25 ENCOUNTER — Ambulatory Visit (HOSPITAL_COMMUNITY)
Admission: RE | Admit: 2020-06-25 | Discharge: 2020-06-25 | Disposition: A | Discharge status: Home / Self Care | Payer: Medicare Other | Source: Ambulatory Visit | Attending: Cardiology | Admitting: Cardiology

## 2020-06-25 ENCOUNTER — Other Ambulatory Visit: Payer: Self-pay | Admitting: Cardiology

## 2020-06-25 ENCOUNTER — Encounter: Payer: Self-pay | Admitting: Physical Therapy

## 2020-06-25 ENCOUNTER — Ambulatory Visit: Payer: Medicare Other | Admitting: Physical Therapy

## 2020-06-25 DIAGNOSIS — I712 Thoracic aortic aneurysm, without rupture: Secondary | ICD-10-CM | POA: Insufficient documentation

## 2020-06-25 DIAGNOSIS — M25652 Stiffness of left hip, not elsewhere classified: Secondary | ICD-10-CM

## 2020-06-25 DIAGNOSIS — Z86711 Personal history of pulmonary embolism: Secondary | ICD-10-CM

## 2020-06-25 DIAGNOSIS — I447 Left bundle-branch block, unspecified: Secondary | ICD-10-CM

## 2020-06-25 DIAGNOSIS — R079 Chest pain, unspecified: Secondary | ICD-10-CM | POA: Insufficient documentation

## 2020-06-25 DIAGNOSIS — R42 Dizziness and giddiness: Secondary | ICD-10-CM

## 2020-06-25 DIAGNOSIS — I10 Essential (primary) hypertension: Secondary | ICD-10-CM

## 2020-06-25 DIAGNOSIS — I7121 Aneurysm of the ascending aorta, without rupture: Secondary | ICD-10-CM

## 2020-06-25 DIAGNOSIS — M25552 Pain in left hip: Secondary | ICD-10-CM

## 2020-06-25 DIAGNOSIS — M6281 Muscle weakness (generalized): Secondary | ICD-10-CM

## 2020-06-25 MED ORDER — TECHNETIUM TO 99M ALBUMIN AGGREGATED
4.3000 | Freq: Once | INTRAVENOUS | Status: AC | PRN
  Administered 2020-06-25: 4.3 via INTRAVENOUS

## 2020-06-25 NOTE — Therapy (Signed)
[place] [place], [place], [address] Phone: [phone]   Fax:  [phone]  Physical Therapy Treatment  Patient Details  Name: Timothy Moran MRN: [id number] Date of Birth: [birth year] Referring Provider (PT): Theresa Duty [place]   Encounter Date: 06/25/2020   PT End of Session - 06/25/20 1007    Visit Number 4    Number of Visits 12    Date for PT Re-Evaluation 07/21/20    Authorization Type Medicare    PT Start Time 0930    PT Stop Time 1011    PT Time Calculation (min) 41 min    Activity Tolerance Patient tolerated treatment well    Behavior During Therapy [facility] for tasks assessed/performed           Past Medical History:  Diagnosis Date  . Abnormal echocardiogram 06/07/2019  . Arrhythmia   . Ascending aortic aneurysm (HCC) 4.2 cm based on CT from December 2020 01/29/2018   4.3 cm by echo in 2019  . Benign brain tumor ([place])   . Benign neoplasm of brain ([place]) 12/19/2017  . Chest pain 12/04/2017  . Chronic kidney disease    Stage 3  . Chronic kidney disease, stage III (moderate) ([place]) 12/19/2017  . Dizziness 10/11/2019  . History of pulmonary embolus (PE)   . Hypertension   . Left bundle branch block 12/19/2017  . Nonsustained ventricular tachycardia ([place]) 10/11/2019  . Personal history of pulmonary embolism 12/19/2017  . Pulmonary hypertension due to thromboembolism ([place]) 03/09/2018   See echo 12/27/17 with nl PAS vs CTa chest 02/23/18   . Sinus bradycardia 12/22/2017  . Solitary pulmonary nodule on lung CT 03/08/2018   CT 12/04/17 1.0 x 0.8 x 0.7 cm nodular opacity in the RUL vs not seen 03/16/10 posterior segment of the right upper lobe. [place] KitchenSpirometry 03/08/2018    FEV1 3.86 (108%)  Ratio 96 s prior rx  - PET  03/13/18   Low grade c/w adenoca > rec T surgery eval      Past Surgical History:  Procedure Laterality Date  . APPENDECTOMY    . HERNIA REPAIR      There were no vitals filed for this visit.    Subjective Assessment - 06/25/20 0926    Subjective "Not too bad" "Mowed the lawn yesterday, up and down the hill, not too much trouble"    Currently in Pain? No/denies                             [facility] Adult PT Treatment/Exercise - 06/25/20 0001      Knee/Hip Exercises: Aerobic   Recumbent Bike L2 x4 min     Nustep L5 x 6 min       Knee/Hip Exercises: Machines for Strengthening   Cybex Knee Extension 10lb 2x10     Cybex Knee Flexion 25lb 2x10     Cybex Leg Press 40lb 2x10       Knee/Hip Exercises: Standing   Heel Raises 2 sets;2 seconds;15 reps;Both    Hip Extension Both;2 sets;10 reps;Knee straight    Extension Limitations 2    Forward Step Up Both;2 sets;10 reps;Hand Hold: 1;Step Height: 8"    Walking with Sports Cord 30lb side ste x5 each     Other Standing Knee Exercises attempted sit to stands but were too painful  PT Short Term Goals - 06/17/20 1157      PT SHORT TERM GOAL #1   Title Pt will be I and compliant with initial HEP.    Status Achieved             PT Long Term Goals - 06/25/20 0940      PT LONG TERM GOAL #1   Title Pt will return to walking for 45 minutes as daily exercise without pain.    Status On-going      PT LONG TERM GOAL #2   Title Pt will increase L hip extensor/abductor/ER MMT to 4+/5.    Status On-going      PT LONG TERM GOAL #3   Title Pt will negotiate 1 flight of stairs with no pain, as needed for home access.    Status Partially Met                 Plan - 06/25/20 1008    Clinical Impression Statement Pt continues to have some fatigue with activity. Report some brain tumors in the past that he thinks needs to be checked out. Some instability today noted with some mobility between interventions, so SBA given throughout session. Reports hip pain has mostly resolved.    Personal Factors and Comorbidities Comorbidity 3+;Past/Current Experience;Time since onset of  injury/illness/exacerbation;Age    Comorbidities Heart Arrhythmia, HTN, Kidney Disease    Examination-Activity Limitations Squat;Lift;Locomotion Level;Stairs    Examination-Participation Restrictions Interpersonal Relationship;Community Activity    Stability/Clinical Decision Making Evolving/Moderate complexity    Rehab Potential Good    PT Frequency 2x / week    PT Duration 6 weeks    PT Treatment/Interventions ADLs/Self Care Home Management;Electrical Stimulation;Cryotherapy;Iontophoresis 4mg /ml Dexamethasone;Moist Heat;Therapeutic exercise;Neuromuscular re-education;Patient/family education;Dry needling;Manual techniques;Vasopneumatic Device;Passive range of motion;Functional mobility training;Stair training;Gait training;Therapeutic activities    PT Next Visit Plan progress L hip flexibility and strength, manual as indicated           Patient will benefit from skilled therapeutic intervention in order to improve the following deficits and impairments:     Visit Diagnosis: Stiffness of left hip, not elsewhere classified  Muscle weakness (generalized)  Pain in left hip     Problem List Patient Active Problem List   Diagnosis Date Noted  . Benign brain tumor ([place])   . Chronic kidney disease   . History of pulmonary embolus (PE)   . Hypertension   . Nonsustained ventricular tachycardia ([place]) 10/11/2019  . Dizziness 10/11/2019  . Abnormal echocardiogram 06/07/2019  . Pulmonary hypertension due to thromboembolism ([place]) 03/09/2018  . Solitary pulmonary nodule on lung CT 03/08/2018  . Ascending aortic aneurysm (HCC) 4.2 cm based on CT from December 2020 01/29/2018  . Sinus bradycardia 12/22/2017  . Left bundle branch block 12/19/2017  . Personal history of pulmonary embolism 12/19/2017  . Chronic kidney disease, stage III (moderate) ([place]) 12/19/2017  . Benign neoplasm of brain ([place]) 12/19/2017  . Chest pain 12/04/2017    Scot Jun 06/25/2020, 10:11 AM  [address] [place], [place], [address] Phone: [phone]   Fax:  [phone]  Name: Johnhenry Tippin MRN: [id number] Date of Birth: 1946-06-21

## 2020-06-30 ENCOUNTER — Encounter: Payer: Self-pay | Admitting: Physical Therapy

## 2020-06-30 ENCOUNTER — Ambulatory Visit: Payer: Medicare Other | Admitting: Physical Therapy

## 2020-06-30 ENCOUNTER — Other Ambulatory Visit: Payer: Self-pay

## 2020-06-30 DIAGNOSIS — R29898 Other symptoms and signs involving the musculoskeletal system: Secondary | ICD-10-CM

## 2020-06-30 DIAGNOSIS — M25652 Stiffness of left hip, not elsewhere classified: Secondary | ICD-10-CM

## 2020-06-30 DIAGNOSIS — M25552 Pain in left hip: Secondary | ICD-10-CM

## 2020-06-30 DIAGNOSIS — M6281 Muscle weakness (generalized): Secondary | ICD-10-CM

## 2020-06-30 NOTE — Therapy (Signed)
[place]. [place], [place], [address] Phone: [phone]   Fax:  [phone]  Physical Therapy Treatment  Patient Details  Name: Timothy Moran MRN: [id number] Date of Birth: [birth year] Referring Provider (PT): Theresa Duty [place]   Encounter Date: 06/30/2020   PT End of Session - 06/30/20 0959    Visit Number 5    Number of Visits 12    Date for PT Re-Evaluation 07/21/20    Authorization Type Medicare    PT Start Time 0922    PT Stop Time 1005    PT Time Calculation (min) 43 min    Activity Tolerance Patient tolerated treatment well    Behavior During Therapy [facility] for tasks assessed/performed           Past Medical History:  Diagnosis Date  . Abnormal echocardiogram 06/07/2019  . Arrhythmia   . Ascending aortic aneurysm (HCC) 4.2 cm based on CT from December 2020 01/29/2018   4.3 cm by echo in 2019  . Benign brain tumor ([place])   . Benign neoplasm of brain ([place]) 12/19/2017  . Chest pain 12/04/2017  . Chronic kidney disease    Stage 3  . Chronic kidney disease, stage III (moderate) ([place]) 12/19/2017  . Dizziness 10/11/2019  . History of pulmonary embolus (PE)   . Hypertension   . Left bundle branch block 12/19/2017  . Nonsustained ventricular tachycardia ([place]) 10/11/2019  . Personal history of pulmonary embolism 12/19/2017  . Pulmonary hypertension due to thromboembolism ([place]) 03/09/2018   See echo 12/27/17 with nl PAS vs CTa chest 02/23/18   . Sinus bradycardia 12/22/2017  . Solitary pulmonary nodule on lung CT 03/08/2018   CT 12/04/17 1.0 x 0.8 x 0.7 cm nodular opacity in the RUL vs not seen 03/16/10 posterior segment of the right upper lobe. [place] KitchenSpirometry 03/08/2018    FEV1 3.86 (108%)  Ratio 96 s prior rx  - PET  03/13/18   Low grade c/w adenoca > rec T surgery eval      Past Surgical History:  Procedure Laterality Date  . APPENDECTOMY    . HERNIA REPAIR      There were no vitals filed for this visit.    Subjective Assessment - 06/30/20 0923    Subjective Reports that he is feeling better, no issues over the weekend, does report some dizziness with activity    Currently in Pain? No/denies                             [facility] Adult PT Treatment/Exercise - 06/30/20 0001      Knee/Hip Exercises: Stretches   Passive Hamstring Stretch Both;3 reps;20 seconds    Piriformis Stretch Both;3 reps;20 seconds      Knee/Hip Exercises: Aerobic   Recumbent Bike L5 x6 min       Knee/Hip Exercises: Machines for Strengthening   Cybex Knee Extension 10lb 3x10     Cybex Knee Flexion 25lb 3x10     Cybex Leg Press 40lb 3x10       Knee/Hip Exercises: Supine   Other Supine Knee/Hip Exercises feet on ball K2C, trunk rotation, small bridge and isometric abs                    PT Short Term Goals - 06/17/20 1497      PT SHORT TERM GOAL #1   Title Pt will be I and compliant with initial HEP.  Status Achieved             PT Long Term Goals - 06/25/20 0940      PT LONG TERM GOAL #1   Title Pt will return to walking for 45 minutes as daily exercise without pain.    Status On-going      PT LONG TERM GOAL #2   Title Pt will increase L hip extensor/abductor/ER MMT to 4+/5.    Status On-going      PT LONG TERM GOAL #3   Title Pt will negotiate 1 flight of stairs with no pain, as needed for home access.    Status Partially Met                 Plan - 06/30/20 1000    Clinical Impression Statement I added a set to all exercises and some very specific hip extension and abduction, this was difficulty especially with the stance leg.  I also added some stretches.  He does have to pause with getting up due to some dizziness    PT Next Visit Plan will see later this week if all is good could do 1x the following week    Consulted and Agree with Plan of Care Patient           Patient will benefit from skilled therapeutic intervention in order to improve the following  deficits and impairments:  Pain, Postural dysfunction, Increased fascial restricitons, Impaired flexibility, Decreased strength, Decreased activity tolerance, Decreased range of motion, Impaired perceived functional ability, Improper body mechanics, Hypomobility, Difficulty walking, Decreased endurance  Visit Diagnosis: Stiffness of left hip, not elsewhere classified  Muscle weakness (generalized)  Pain in left hip  Poor body mechanics     Problem List Patient Active Problem List   Diagnosis Date Noted  . Benign brain tumor (HCC)   . Chronic kidney disease   . History of pulmonary embolus (PE)   . Hypertension   . Nonsustained ventricular tachycardia (HCC) 10/11/2019  . Dizziness 10/11/2019  . Abnormal echocardiogram 06/07/2019  . Pulmonary hypertension due to thromboembolism (HCC) 03/09/2018  . Solitary pulmonary nodule on lung CT 03/08/2018  . Ascending aortic aneurysm (HCC) 4.2 cm based on CT from December 2020 01/29/2018  . Sinus bradycardia 12/22/2017  . Left bundle branch block 12/19/2017  . Personal history of pulmonary embolism 12/19/2017  . Chronic kidney disease, stage III (moderate) (HCC) 12/19/2017  . Benign neoplasm of brain (HCC) 12/19/2017  . Chest pain 12/04/2017    , W., PT 06/30/2020, 10:07 AM  [facility] [address] [place], [place], [address] Phone: [phone]   Fax:  [phone]  Name: Timothy Moran MRN: [id number] Date of Birth: 07/10/1946   

## 2020-07-02 ENCOUNTER — Other Ambulatory Visit: Payer: Self-pay

## 2020-07-02 ENCOUNTER — Encounter: Payer: Self-pay | Admitting: Physical Therapy

## 2020-07-02 ENCOUNTER — Ambulatory Visit: Payer: Medicare Other | Admitting: Physical Therapy

## 2020-07-02 DIAGNOSIS — M25552 Pain in left hip: Secondary | ICD-10-CM

## 2020-07-02 DIAGNOSIS — M25652 Stiffness of left hip, not elsewhere classified: Secondary | ICD-10-CM

## 2020-07-02 DIAGNOSIS — M6281 Muscle weakness (generalized): Secondary | ICD-10-CM

## 2020-07-02 NOTE — Therapy (Signed)
[place]. [place], [place], [address] Phone: [phone]   Fax:  [phone]  Physical Therapy Treatment  Patient Details  Name: Timothy Moran MRN: [id number] Date of Birth: 1946/07/31 Referring Provider (PT): Theresa Duty [place]   Encounter Date: 07/02/2020   PT End of Session - 07/02/20 1223    Visit Number 6    Date for PT Re-Evaluation 07/21/20    Authorization Type Medicare    PT Start Time 1145    PT Stop Time 1226    PT Time Calculation (min) 41 min    Activity Tolerance Patient tolerated treatment well    Behavior During Therapy [facility] for tasks assessed/performed           Past Medical History:  Diagnosis Date  . Abnormal echocardiogram 06/07/2019  . Arrhythmia   . Ascending aortic aneurysm (HCC) 4.2 cm based on CT from December 2020 01/29/2018   4.3 cm by echo in 2019  . Benign brain tumor ([place])   . Benign neoplasm of brain ([place]) 12/19/2017  . Chest pain 12/04/2017  . Chronic kidney disease    Stage 3  . Chronic kidney disease, stage III (moderate) ([place]) 12/19/2017  . Dizziness 10/11/2019  . History of pulmonary embolus (PE)   . Hypertension   . Left bundle branch block 12/19/2017  . Nonsustained ventricular tachycardia ([place]) 10/11/2019  . Personal history of pulmonary embolism 12/19/2017  . Pulmonary hypertension due to thromboembolism ([place]) 03/09/2018   See echo 12/27/17 with nl PAS vs CTa chest 02/23/18   . Sinus bradycardia 12/22/2017  . Solitary pulmonary nodule on lung CT 03/08/2018   CT 12/04/17 1.0 x 0.8 x 0.7 cm nodular opacity in the RUL vs not seen 03/16/10 posterior segment of the right upper lobe. [place] KitchenSpirometry 03/08/2018    FEV1 3.86 (108%)  Ratio 96 s prior rx  - PET  03/13/18   Low grade c/w adenoca > rec T surgery eval      Past Surgical History:  Procedure Laterality Date  . APPENDECTOMY    . HERNIA REPAIR      There were no vitals filed for this visit.   Subjective Assessment -  07/02/20 1146    Subjective Doing ok, some dizziness when he closes his eyes    Currently in Pain? No/denies                             [facility] Adult PT Treatment/Exercise - 07/02/20 0001      High Level Balance   High Level Balance Activities Side stepping;Tandem walking   on balance beam with UE assist      Knee/Hip Exercises: Aerobic   Recumbent Bike L5 x6 min       Knee/Hip Exercises: Machines for Strengthening   Cybex Knee Extension 10lb 3x10     Cybex Knee Flexion 35lb 3x10     Cybex Leg Press 50lb 3x10       Knee/Hip Exercises: Standing   Heel Raises 2 sets;2 seconds;15 reps;Both    Lateral Step Up Both;1 set;10 reps;Hand Hold: 1;Step Height: 6"    Other Standing Knee Exercises Alt 8 inch box taps 2x10       Knee/Hip Exercises: Seated   Sit to Sand 1 set;10 reps;with UE support;without UE support   on Airex from elevated surface  PT Short Term Goals - 06/17/20 2229      PT SHORT TERM GOAL #1   Title Pt will be I and compliant with initial HEP.    Status Achieved             PT Long Term Goals - 06/25/20 0940      PT LONG TERM GOAL #1   Title Pt will return to walking for 45 minutes as daily exercise without pain.    Status On-going      PT LONG TERM GOAL #2   Title Pt will increase L hip extensor/abductor/ER MMT to 4+/5.    Status On-going      PT LONG TERM GOAL #3   Title Pt will negotiate 1 flight of stairs with no pain, as needed for home access.    Status Partially Met                 Plan - 07/02/20 1224    Clinical Impression Statement Pt continues to pause one standing before any mobility to establish balance. He reports that hip pain has resolved. Sone instability noted throughout session on non compliant surfaces. Increase fatigue and shortness of breath noted with functional activities.    Personal Factors and Comorbidities Comorbidity 3+;Past/Current Experience;Time since onset of  injury/illness/exacerbation;Age    Comorbidities Heart Arrhythmia, HTN, Kidney Disease    Examination-Activity Limitations Squat;Lift;Locomotion Level;Stairs    Examination-Participation Restrictions Interpersonal Relationship;Community Activity    Stability/Clinical Decision Making Evolving/Moderate complexity    PT Frequency 2x / week    PT Duration 2 weeks    PT Treatment/Interventions ADLs/Self Care Home Management;Electrical Stimulation;Cryotherapy;Iontophoresis 14m/ml Dexamethasone;Moist Heat;Therapeutic exercise;Neuromuscular re-education;Patient/family education;Dry needling;Manual techniques;Vasopneumatic Device;Passive range of motion;Functional mobility training;Stair training;Gait training;Therapeutic activities    PT Next Visit Plan possibel decrease to once per week.           Patient will benefit from skilled therapeutic intervention in order to improve the following deficits and impairments:  Pain, Postural dysfunction, Increased fascial restricitons, Impaired flexibility, Decreased strength, Decreased activity tolerance, Decreased range of motion, Impaired perceived functional ability, Improper body mechanics, Hypomobility, Difficulty walking, Decreased endurance  Visit Diagnosis: Pain in left hip  Muscle weakness (generalized)  Stiffness of left hip, not elsewhere classified     Problem List Patient Active Problem List   Diagnosis Date Noted  . Benign brain tumor ([place]   . Chronic kidney disease   . History of pulmonary embolus (PE)   . Hypertension   . Nonsustained ventricular tachycardia ([place] 10/11/2019  . Dizziness 10/11/2019  . Abnormal echocardiogram 06/07/2019  . Pulmonary hypertension due to thromboembolism ([place] 03/09/2018  . Solitary pulmonary nodule on lung CT 03/08/2018  . Ascending aortic aneurysm (HCC) 4.2 cm based on CT from December 2020 01/29/2018  . Sinus bradycardia 12/22/2017  . Left bundle branch block 12/19/2017  . Personal history of  pulmonary embolism 12/19/2017  . Chronic kidney disease, stage III (moderate) ([place] 12/19/2017  . Benign neoplasm of brain ([place] 12/19/2017  . Chest pain 12/04/2017    RScot Jun PTA 07/02/2020, 12:27 PM  [place] [address]Phone: [phone]  Fax:  [phone] Name: RZaydan PapeshMRN: [id number] of Birth: [birth year]

## 2020-07-03 ENCOUNTER — Other Ambulatory Visit: Payer: Self-pay | Admitting: Family Medicine

## 2020-07-03 DIAGNOSIS — D329 Benign neoplasm of meninges, unspecified: Secondary | ICD-10-CM

## 2020-07-08 ENCOUNTER — Ambulatory Visit: Payer: Medicare Other | Admitting: Physical Therapy

## 2020-07-08 ENCOUNTER — Encounter: Payer: Self-pay | Admitting: Physical Therapy

## 2020-07-08 ENCOUNTER — Other Ambulatory Visit: Payer: Self-pay

## 2020-07-08 DIAGNOSIS — R29898 Other symptoms and signs involving the musculoskeletal system: Secondary | ICD-10-CM

## 2020-07-08 DIAGNOSIS — M6281 Muscle weakness (generalized): Secondary | ICD-10-CM

## 2020-07-08 DIAGNOSIS — M25652 Stiffness of left hip, not elsewhere classified: Secondary | ICD-10-CM

## 2020-07-08 DIAGNOSIS — M25552 Pain in left hip: Secondary | ICD-10-CM

## 2020-07-08 NOTE — Therapy (Signed)
[place] [place], [place], [address] Phone: [phone]   Fax:  [phone]  Physical Therapy Treatment  Patient Details  Name: Timothy Moran MRN: [id number] Date of Birth: [birth year] Referring Provider (PT): Theresa Duty [place]   Encounter Date: 07/08/2020   PT End of Session - 07/08/20 0914    Visit Number 7    PT Start Time 0833    PT Stop Time 0918    PT Time Calculation (min) 45 min    Activity Tolerance Patient tolerated treatment well    Behavior During Therapy [facility] for tasks assessed/performed           Past Medical History:  Diagnosis Date   Abnormal echocardiogram 06/07/2019   Arrhythmia    Ascending aortic aneurysm ([place]) 4.2 cm based on CT from December 2020 01/29/2018   4.3 cm by echo in 2019   Benign brain tumor ([place])    Benign neoplasm of brain ([place]) 12/19/2017   Chest pain 12/04/2017   Chronic kidney disease    Stage 3   Chronic kidney disease, stage III (moderate) ([place]) 12/19/2017   Dizziness 10/11/2019   History of pulmonary embolus (PE)    Hypertension    Left bundle branch block 12/19/2017   Nonsustained ventricular tachycardia ([place]) 10/11/2019   Personal history of pulmonary embolism 12/19/2017   Pulmonary hypertension due to thromboembolism ([place]) 03/09/2018   See echo 12/27/17 with nl PAS vs CTa chest 02/23/18    Sinus bradycardia 12/22/2017   Solitary pulmonary nodule on lung CT 03/08/2018   CT 12/04/17 1.0 x 0.8 x 0.7 cm nodular opacity in the RUL vs not seen 03/16/10 posterior segment of the right upper lobe. Marland KitchenSpirometry 03/08/2018    FEV1 3.86 (108%)  Ratio 96 s prior rx  - PET  03/13/18   Low grade c/w adenoca > rec T surgery eval      Past Surgical History:  Procedure Laterality Date   APPENDECTOMY     HERNIA REPAIR      There were no vitals filed for this visit.   Subjective Assessment - 07/08/20 0837    Subjective No pain.  "I think the balance is a little  better"                             [facility] Adult PT Treatment/Exercise - 07/08/20 0001      Ambulation/Gait   Gait Comments outside around the back building, with a brisk pace, no issues      Knee/Hip Exercises: Stretches   Passive Hamstring Stretch Both;3 reps;20 seconds    Piriformis Stretch Both;3 reps;20 seconds    Gastroc Stretch Both;3 reps;20 seconds      Knee/Hip Exercises: Aerobic   Recumbent Bike L5 x6 min LE only      Knee/Hip Exercises: Machines for Strengthening   Cybex Knee Extension 10lb 3x10     Cybex Knee Flexion 35lb 3x10     Cybex Leg Press 50lb 3x10       Knee/Hip Exercises: Standing   Other Standing Knee Exercises core work with green tband straight arm pulls with cues to set core and then AR press, for his HEP                  PT Education - 07/08/20 0843    Education Details Went over HEP with tband for some UE and core, and for LEs, also went over  stretches, went over return to walking and the need to alternate sna not walk everyday, mixing things up    Person(s) Educated Patient    Methods Explanation;Demonstration;Tactile cues;Verbal cues    Comprehension Verbalized understanding;Returned demonstration            PT Short Term Goals - 06/17/20 0923      PT SHORT TERM GOAL #1   Title Pt will be I and compliant with initial HEP.    Status Achieved             PT Long Term Goals - 07/08/20 0917      PT LONG TERM GOAL #1   Title Pt will return to walking for 45 minutes as daily exercise without pain.    Status Achieved      PT LONG TERM GOAL #2   Title Pt will increase L hip extensor/abductor/ER MMT to 4+/5.    Status Achieved      PT LONG TERM GOAL #3   Title Pt will negotiate 1 flight of stairs with no pain, as needed for home access.    Status Achieved      PT LONG TERM GOAL #4   Title Pt will demo proper squat form with no pain, for ADLs/IADLs.    Status Achieved      PT LONG TERM GOAL #5   Title Pt  will be I and compliant with long term HEP for injury prevention.    Status Achieved                 Plan - 07/08/20 0915    Clinical Impression Statement Patient is still having some balance issues, he will be having a brain scan in two weeks, he describes some vision issues.  He feels like he can do the HEP on his own, we went over this very well, I think he knows that he pushes at times too hard and this is how he hurt his hip.  We reviewed light low load long duration stretches, core and hip strength and how to do at home    PT Next Visit Plan D/C with goals met    Consulted and Agree with Plan of Care Patient           Patient will benefit from skilled therapeutic intervention in order to improve the following deficits and impairments:  Pain, Postural dysfunction, Increased fascial restricitons, Impaired flexibility, Decreased strength, Decreased activity tolerance, Decreased range of motion, Impaired perceived functional ability, Improper body mechanics, Hypomobility, Difficulty walking, Decreased endurance  Visit Diagnosis: Pain in left hip  Muscle weakness (generalized)  Stiffness of left hip, not elsewhere classified  Poor body mechanics     Problem List Patient Active Problem List   Diagnosis Date Noted   Benign brain tumor [facility])    Chronic kidney disease    History of pulmonary embolus (PE)    Hypertension    Nonsustained ventricular tachycardia ([place]) 10/11/2019   Dizziness 10/11/2019   Abnormal echocardiogram 06/07/2019   Pulmonary hypertension due to thromboembolism ([place]) 03/09/2018   Solitary pulmonary nodule on lung CT 03/08/2018   Ascending aortic aneurysm (H[facility]) 4.2 cm based on CT from December 2020 01/29/2018   Sinus bradycardia 12/22/2017   Left bundle branch block 12/19/2017   Personal history of pulmonary embolism 12/19/2017   Chronic kidney disease, stage III (moderate) ([place]) 12/19/2017   Benign neoplasm of brain ([place]) 12/19/2017    Chest pain 12/04/2017    Sumner Boast., PT 07/08/2020, 9:19  [place]. [place], [place], [address] Phone: [phone]   Fax:  [phone]  Name: Timothy Moran MRN: [id number] Date of Birth: [birth year]

## 2020-07-26 ENCOUNTER — Other Ambulatory Visit: Payer: Self-pay

## 2020-07-26 ENCOUNTER — Ambulatory Visit
Admission: RE | Admit: 2020-07-26 | Discharge: 2020-07-26 | Disposition: A | Discharge status: Home / Self Care | Payer: Medicare Other | Source: Ambulatory Visit | Attending: Family Medicine | Admitting: Family Medicine

## 2020-07-26 DIAGNOSIS — D329 Benign neoplasm of meninges, unspecified: Secondary | ICD-10-CM

## 2020-07-26 MED ORDER — GADOBENATE DIMEGLUMINE 529 MG/ML IV SOLN
20.0000 mL | Freq: Once | INTRAVENOUS | Status: AC | PRN
  Administered 2020-07-26: 20 mL via INTRAVENOUS

## 2020-07-30 ENCOUNTER — Other Ambulatory Visit: Payer: Self-pay | Admitting: Thoracic Surgery (Cardiothoracic Vascular Surgery)

## 2020-07-30 DIAGNOSIS — I712 Thoracic aortic aneurysm, without rupture, unspecified: Secondary | ICD-10-CM

## 2020-07-31 DIAGNOSIS — I469 Cardiac arrest, cause unspecified: Secondary | ICD-10-CM | POA: Insufficient documentation

## 2020-07-31 DIAGNOSIS — I499 Cardiac arrhythmia, unspecified: Secondary | ICD-10-CM | POA: Insufficient documentation

## 2020-07-31 DIAGNOSIS — I4901 Ventricular fibrillation: Secondary | ICD-10-CM | POA: Insufficient documentation

## 2020-08-03 ENCOUNTER — Ambulatory Visit (INDEPENDENT_AMBULATORY_CARE_PROVIDER_SITE_OTHER): Payer: Medicare Other | Admitting: Cardiology

## 2020-08-03 ENCOUNTER — Encounter: Payer: Self-pay | Admitting: Cardiology

## 2020-08-03 ENCOUNTER — Other Ambulatory Visit: Payer: Self-pay

## 2020-08-03 VITALS — BP 122/84 | HR 44 | Ht 73.5 in | Wt 225.0 lb

## 2020-08-03 DIAGNOSIS — I447 Left bundle-branch block, unspecified: Secondary | ICD-10-CM | POA: Diagnosis not present

## 2020-08-03 DIAGNOSIS — I712 Thoracic aortic aneurysm, without rupture: Secondary | ICD-10-CM | POA: Diagnosis not present

## 2020-08-03 DIAGNOSIS — Z86711 Personal history of pulmonary embolism: Secondary | ICD-10-CM | POA: Diagnosis not present

## 2020-08-03 DIAGNOSIS — I7121 Aneurysm of the ascending aorta, without rupture: Secondary | ICD-10-CM

## 2020-08-03 MED ORDER — ROSUVASTATIN CALCIUM 20 MG PO TABS: 20.0000 mg | ORAL_TABLET | Freq: Every day | ORAL | 1 refills | Status: DC

## 2020-08-03 NOTE — Addendum Note (Signed)
Addended by: Senaida Ores on: 08/03/2020 09:14 AM   Modules accepted: Orders

## 2020-08-03 NOTE — Patient Instructions (Signed)
Medication Instructions:  Your physician has recommended you make the following change in your medication:   STOP: Eliquis in January 2022   INCREASE: Crestor to 20 mg daily   *If you need a refill on your cardiac medications before your next appointment, please call your pharmacy*   Lab Work: Your physician recommends that you return for lab work in 6 weeks fasting: lipid , ast , alt   If you have labs (blood work) drawn today and your tests are completely normal, you will receive your results only by: Marland Kitchen MyChart Message (if you have MyChart) OR . A paper copy in the mail If you have any lab test that is abnormal or we need to change your treatment, we will call you to review the results.   Testing/Procedures: None   Follow-Up: At [facility], you and your health needs are our priority.  As part of our continuing mission to provide you with exceptional heart care, we have created designated Provider Care Teams.  These Care Teams include your primary Cardiologist (physician) and Advanced Practice Providers (APPs -  Physician Assistants and Nurse Practitioners) who all work together to provide you with the care you need, when you need it.  We recommend signing up for the patient portal called "MyChart".  Sign up information is provided on this After Visit Summary.  MyChart is used to connect with patients for Virtual Visits (Telemedicine).  Patients are able to view lab/test results, encounter notes, upcoming appointments, etc.  Non-urgent messages can be sent to your provider as well.   To learn more about what you can do with MyChart, go to [email or web address].    Your next appointment:   5 month(s)  The format for your next appointment:   In Person  Provider:   Jenne Campus, MD   Other Instructions

## 2020-08-03 NOTE — Progress Notes (Signed)
Cardiology Office Note:    Date:  08/03/2020   ID:  Timothy Moran, DOB 12-16-1945, MRN [id number]  PCP:  Cari Caraway, MD  Cardiologist:  Jenne Campus, MD    Referring MD: Cari Caraway, MD   Chief Complaint  Patient presents with  . Follow-up  I am doing fine  History of Present Illness:    Timothy Moran is a 74 y.o. male with past medical history significant for 2 pulmonary emboli's both were felt to be provoked by long traveling, essential hypertension, ascending aortic aneurysm measuring 44 mm by echo from this year, left bundle branch block, ejection fraction 4045%.  Dyslipidemia.  Comes today 2 months of follow-up.  Last time I see her 1-1/34-month ago when he came back from a trip to [place] complained of having some shortness of breath.  Also interesting when he was driving he had some neurological event he said he was not able to see on the left eye he had to drive halfway with one eye closed. I did a D-dimer on him warning about potentially DVT and pulmonary emboli D-dimer came very elevated after that VQ scan was done which was negative.  I still put him on Eliquis prophylactically and we discussed this issue today.  He is taking Eliquis but he does not like it.  On top of that he does have history of meningioma in the recently he did have some MRI done which showed some new areas of concerns and he is scheduled to have discussion about it.  He also got scheduled appointment for CT of his chest to follow-up on the aneurysm.  Past Medical History:  Diagnosis Date  . Abnormal echocardiogram 06/07/2019  . Arrhythmia   . Ascending aortic aneurysm ([facility]) 4.2 cm based on CT from December 2020 01/29/2018   4.3 cm by echo in 2019  . Benign brain tumor ([place])   . Benign neoplasm of brain ([place]) 12/19/2017  . Chest pain 12/04/2017  . Chronic kidney disease    Stage 3  . Chronic kidney disease, stage III (moderate) ([place]) 12/19/2017  . Dizziness 10/11/2019  . History of pulmonary  embolus (PE)   . Hypertension   . Left bundle branch block 12/19/2017  . Nonsustained ventricular tachycardia ([place]) 10/11/2019  . Pain of left hip joint 05/29/2020  . Personal history of pulmonary embolism 12/19/2017  . Pulmonary hypertension due to thromboembolism ([place]) 03/09/2018   See echo 12/27/17 with nl PAS vs CTa chest 02/23/18   . Sinus bradycardia 12/22/2017  . Solitary pulmonary nodule on lung CT 03/08/2018   CT 12/04/17 1.0 x 0.8 x 0.7 cm nodular opacity in the RUL vs not seen 03/16/10 posterior segment of the right upper lobe. Marland KitchenSpirometry 03/08/2018    FEV1 3.86 (108%)  Ratio 96 s prior rx  - PET  03/13/18   Low grade c/w adenoca > rec T surgery eval      Past Surgical History:  Procedure Laterality Date  . APPENDECTOMY    . HERNIA REPAIR      Current Medications: Current Meds  Medication Sig  . apixaban (ELIQUIS) 5 MG TABS tablet Take 1 tablet (5 mg total) by mouth 2 (two) times daily.  . Azelastine HCl 137 MCG/SPRAY SOLN Place 1 spray into both nostrils at bedtime.  . B Complex-C (SUPER B COMPLEX PO) Take 1 tablet by mouth daily.  . Cholecalciferol (VITAMIN D3) 5000 units TABS Take 1 tablet by mouth daily.   . finasteride (PROSCAR) 5 MG tablet Take  5 mg by mouth daily.  . Magnesium 400 MG CAPS Take 400 mg by mouth daily.   . nitroGLYCERIN (NITROSTAT) 0.4 MG SL tablet Place 1 tablet (0.4 mg total) under the tongue every 5 (five) minutes as needed for chest pain.  . Omega-3 Fatty Acids (FISH OIL) 1360 MG CAPS Take 2 capsules by mouth daily.  . rosuvastatin (CRESTOR) 10 MG tablet Take 1 tablet (10 mg total) by mouth daily.  . tamsulosin (FLOMAX) 0.4 MG CAPS capsule Take 2 capsules by mouth daily.   . valsartan (DIOVAN) 160 MG tablet TAKE 1 TABLET DAILY     Allergies:   Patient has no known allergies.   Social History   Socioeconomic History  . Marital status: Married    Spouse name: Not on file  . Number of children: Not on file  . Years of education: Not on file  . Highest  education level: Not on file  Occupational History  . Not on file  Tobacco Use  . Smoking status: Former Smoker    Packs/day: 0.50    Years: 5.00    Pack years: 2.50    Types: Cigarettes    Quit date: 09/12/1968    Years since quitting: 51.9  . Smokeless tobacco: Never Used  Vaping Use  . Vaping Use: Never used  Substance and Sexual Activity  . Alcohol use: Yes    Comment: ONE PER WEEK  . Drug use: Never  . Sexual activity: Not on file  Other Topics Concern  . Not on file  Social History Narrative  . Not on file   Social Determinants of Health   Financial Resource Strain:   . Difficulty of Paying Living Expenses: Not on file  Food Insecurity:   . Worried About Charity fundraiser in the Last Year: Not on file  . Ran Out of Food in the Last Year: Not on file  Transportation Needs:   . Lack of Transportation (Medical): Not on file  . Lack of Transportation (Non-Medical): Not on file  Physical Activity:   . Days of Exercise per Week: Not on file  . Minutes of Exercise per Session: Not on file  Stress:   . Feeling of Stress : Not on file  Social Connections:   . Frequency of Communication with Friends and Family: Not on file  . Frequency of Social Gatherings with Friends and Family: Not on file  . Attends Religious Services: Not on file  . Active Member of Clubs or Organizations: Not on file  . Attends Archivist Meetings: Not on file  . Marital Status: Not on file     Family History: The patient's family history includes Brain cancer in his sister; Diabetes in his mother; Leukemia in his brother; Melanoma in his brother. ROS:   Please see the history of present illness.    All 14 point review of systems negative except as described per history of present illness  EKGs/Labs/Other Studies Reviewed:      Recent Labs: 06/19/2020: ALT 30; BUN 16; Creatinine, Ser 1.38; Hemoglobin 15.4; Platelets 245; Potassium 4.6; Sodium 142  Recent Lipid Panel      Component Value Date/Time   CHOL 247 (H) 06/01/2020 0858   TRIG 338 (H) 06/01/2020 0858   HDL 33 (L) 06/01/2020 0858   CHOLHDL 7.5 (H) 06/01/2020 0858   LDLCALC 151 (H) 06/01/2020 8299    Physical Exam:    VS:  BP 122/84 (BP Location: Right Arm, Patient Position: Sitting, Cuff  Size: Timothy)   Pulse (!) 44   Ht 6' 1.5" (1.867 m)   Wt 225 lb (102.1 kg)   SpO2 98%   BMI 29.28 kg/m     Wt Readings from Last 3 Encounters:  08/03/20 225 lb (102.1 kg)  06/19/20 224 lb (101.6 kg)  06/01/20 222 lb 1.3 oz (100.7 kg)     GEN:  Well nourished, well developed in no acute distress HEENT: Timothy NECK: No JVD; No carotid bruits LYMPHATICS: No lymphadenopathy CARDIAC: RRR, no murmurs, no rubs, no gallops RESPIRATORY:  Clear to auscultation without rales, wheezing or rhonchi  ABDOMEN: Soft, non-tender, non-distended MUSCULOSKELETAL:  No edema; No deformity  SKIN: Warm and dry LOWER EXTREMITIES: no swelling NEUROLOGIC:  Alert and oriented x 3 PSYCHIATRIC:  Timothy affect   ASSESSMENT:    1. Ascending aortic aneurysm (HCC) 4.2 cm based on CT from December 2020   2. Left bundle branch block   3. History of pulmonary embolus (PE)    PLAN:    In order of problems listed above:  1. Ascending arctic aneurysm CT being scheduled.  Will wait for results of it. 2. Left bundle branch block: Chronic noted. 3. History of pulmonary emboli he is anticoagulated right now he did have very elevated D-dimer likely VQ scan negative.  Had a long discussion with him regarding that issue.  He said he was put on anticoagulation twice secondary to pulmonary emboli but his cardiologist before thought that was provoked pulmonary emboli because he did travel a lot.  He is anticoagulation has been withdrawn his been doing well until last time I seen him.  I told him in his situation there is no clear-cut indication for anticoagulation in my opinion I think it would be beneficial to continue, however, he does not  like it.  He preferred to discontinue anticoagulation.  Therefore I asked him to complete 3 months of therapy and then discontinue. 4. Meningioma that be followed by internal medicine team and neurology. 5. Essential hypertension blood pressure well controlled. 6. Dyslipidemia: I will double his Crestor from 10-20 he will have fasting lipid profile done.  I did review K PN which show me his LDL 151 this is from June 01, 2020 with HDL 33.   Medication Adjustments/Labs and Tests Ordered: Current medicines are reviewed at length with the patient today.  Concerns regarding medicines are outlined above.  No orders of the defined types were placed in this encounter.  Medication changes: No orders of the defined types were placed in this encounter.   Signed, Park Liter, MD, [facility] 08/03/2020 9:08 AM    [place]

## 2020-08-14 DIAGNOSIS — D329 Benign neoplasm of meninges, unspecified: Secondary | ICD-10-CM | POA: Insufficient documentation

## 2020-08-14 DIAGNOSIS — D333 Benign neoplasm of cranial nerves: Secondary | ICD-10-CM | POA: Insufficient documentation

## 2020-08-14 HISTORY — DX: Benign neoplasm of meninges, unspecified: D32.9

## 2020-08-14 HISTORY — DX: Benign neoplasm of cranial nerves: D33.3

## 2020-08-20 ENCOUNTER — Other Ambulatory Visit: Payer: Medicare Other

## 2020-08-25 ENCOUNTER — Ambulatory Visit: Payer: Medicare Other | Admitting: Thoracic Surgery (Cardiothoracic Vascular Surgery)

## 2020-09-04 ENCOUNTER — Other Ambulatory Visit: Payer: Self-pay | Admitting: Cardiology

## 2020-09-18 ENCOUNTER — Ambulatory Visit
Admission: RE | Admit: 2020-09-18 | Discharge: 2020-09-18 | Disposition: A | Discharge status: Home / Self Care | Payer: Medicare Other | Source: Ambulatory Visit | Attending: Thoracic Surgery (Cardiothoracic Vascular Surgery) | Admitting: Thoracic Surgery (Cardiothoracic Vascular Surgery)

## 2020-09-18 ENCOUNTER — Other Ambulatory Visit: Payer: Self-pay

## 2020-09-18 DIAGNOSIS — I712 Thoracic aortic aneurysm, without rupture, unspecified: Secondary | ICD-10-CM

## 2020-09-18 DIAGNOSIS — I2510 Atherosclerotic heart disease of native coronary artery without angina pectoris: Secondary | ICD-10-CM | POA: Diagnosis not present

## 2020-09-18 DIAGNOSIS — I517 Cardiomegaly: Secondary | ICD-10-CM | POA: Diagnosis not present

## 2020-09-18 DIAGNOSIS — I700 Atherosclerosis of aorta: Secondary | ICD-10-CM | POA: Diagnosis not present

## 2020-09-18 MED ORDER — IOPAMIDOL (ISOVUE-370) INJECTION 76%
75.0000 mL | Freq: Once | INTRAVENOUS | Status: AC | PRN
  Administered 2020-09-18: 75 mL via INTRAVENOUS

## 2020-09-21 DIAGNOSIS — M5126 Other intervertebral disc displacement, lumbar region: Secondary | ICD-10-CM | POA: Diagnosis not present

## 2020-09-21 DIAGNOSIS — D320 Benign neoplasm of cerebral meninges: Secondary | ICD-10-CM | POA: Diagnosis not present

## 2020-09-21 DIAGNOSIS — R937 Abnormal findings on diagnostic imaging of other parts of musculoskeletal system: Secondary | ICD-10-CM | POA: Diagnosis not present

## 2020-09-21 DIAGNOSIS — G9589 Other specified diseases of spinal cord: Secondary | ICD-10-CM | POA: Diagnosis not present

## 2020-09-21 DIAGNOSIS — M47894 Other spondylosis, thoracic region: Secondary | ICD-10-CM | POA: Diagnosis not present

## 2020-09-21 DIAGNOSIS — R42 Dizziness and giddiness: Secondary | ICD-10-CM | POA: Diagnosis not present

## 2020-09-22 ENCOUNTER — Encounter: Payer: Self-pay | Admitting: Thoracic Surgery (Cardiothoracic Vascular Surgery)

## 2020-09-22 ENCOUNTER — Other Ambulatory Visit: Payer: Self-pay

## 2020-09-22 ENCOUNTER — Ambulatory Visit (INDEPENDENT_AMBULATORY_CARE_PROVIDER_SITE_OTHER): Payer: Medicare Other | Admitting: Thoracic Surgery (Cardiothoracic Vascular Surgery)

## 2020-09-22 VITALS — BP 150/88 | HR 65 | Resp 20 | Ht 73.0 in | Wt 227.0 lb

## 2020-09-22 DIAGNOSIS — I712 Thoracic aortic aneurysm, without rupture: Secondary | ICD-10-CM

## 2020-09-22 DIAGNOSIS — I7121 Aneurysm of the ascending aorta, without rupture: Secondary | ICD-10-CM

## 2020-09-22 DIAGNOSIS — R911 Solitary pulmonary nodule: Secondary | ICD-10-CM | POA: Diagnosis not present

## 2020-09-22 NOTE — Progress Notes (Signed)
[address]       [place] [address]             [phone]    HPI: Mr. Ellers returns for follow-up of his ascending aneurysm  Filbert Eckart is a 75 year old gentleman with a past medical history significant for hypertension, pulmonary embolus, meningiomas, stage III chronic kidney disease, right upper and lower lobe lung nodules, ascending aortic aneurysm, left bundle branch block, and a benign spinal tumor.    He presented with chest pain in March 2019.  CT angiogram showed slightly enlarged ascending aorta.  He also had nodules in the right upper lobe and right lower lobe.  On PET CT there was some mild uptake in the upper lobe nodule.  I recommended a biopsy but he opted for radiographic follow-up.  We have been following those and the ascending aneurysm since then.  I last saw him a year ago.  He recently had an episode where he was driving and had some dizziness and some visual changes and had to close his left eye to keep driving.  Work-up revealed 3 meningiomas.  He is scheduled to start radiation on those soon.  He also complained of some shortness of breath.  His D-dimer was very elevated.  His VQ scan was low probability.  he was started on Eliquis anyway.  He subsequently has stopped that medication.  Past Medical History:  Diagnosis Date  . Abnormal echocardiogram 06/07/2019  . Arrhythmia   . Ascending aortic aneurysm ([facility]) 4.2 cm based on CT from December 2020 01/29/2018   4.3 cm by echo in 2019  . Benign brain tumor ([place])   . Benign neoplasm of brain ([place]) 12/19/2017  . Chest pain 12/04/2017  . Chronic kidney disease    Stage 3  . Chronic kidney disease, stage III (moderate) ([place]) 12/19/2017  . Dizziness 10/11/2019  . History of pulmonary embolus (PE)   . Hypertension   . Left bundle branch block 12/19/2017  . Nonsustained ventricular tachycardia ([place]) 10/11/2019  . Pain of left hip joint 05/29/2020  . Personal history of pulmonary embolism  12/19/2017  . Pulmonary hypertension due to thromboembolism ([place]) 03/09/2018   See echo 12/27/17 with nl PAS vs CTa chest 02/23/18   . Sinus bradycardia 12/22/2017  . Solitary pulmonary nodule on lung CT 03/08/2018   CT 12/04/17 1.0 x 0.8 x 0.7 cm nodular opacity in the RUL vs not seen 03/16/10 posterior segment of the right upper lobe. Marland KitchenSpirometry 03/08/2018    FEV1 3.86 (108%)  Ratio 96 s prior rx  - PET  03/13/18   Low grade c/w adenoca > rec T surgery eval      Current Outpatient Medications  Medication Sig Dispense Refill  . Azelastine HCl 137 MCG/SPRAY SOLN Place 1 spray into both nostrils at bedtime.    . B Complex-C (SUPER B COMPLEX PO) Take 1 tablet by mouth daily.    . Cholecalciferol (VITAMIN D3) 5000 units TABS Take 1 tablet by mouth daily.     . finasteride (PROSCAR) 5 MG tablet Take 5 mg by mouth daily.    . Magnesium 400 MG CAPS Take 400 mg by mouth daily.     . nitroGLYCERIN (NITROSTAT) 0.4 MG SL tablet Place 1 tablet (0.4 mg total) under the tongue every 5 (five) minutes as needed for chest pain. 25 tablet 11  . Omega-3 Fatty Acids (FISH OIL) 1360 MG CAPS Take 2 capsules by mouth daily.    Marland Kitchen  rosuvastatin (CRESTOR) 20 MG tablet Take 1 tablet (20 mg total) by mouth daily. 90 tablet 1  . tamsulosin (FLOMAX) 0.4 MG CAPS capsule Take 2 capsules by mouth daily.     . valsartan (DIOVAN) 160 MG tablet Take 1 tablet (160 mg total) by mouth daily. 90 tablet 3   No current facility-administered medications for this visit.    Physical Exam BP (!) 150/88   Pulse 65   Resp 20   Ht 6\' 1"  (1.854 m)   Wt 227 lb (103 kg)   SpO2 95% Comment: RA  BMI 29.77 kg/m  75 year old man in no acute distress Alert and oriented x3 with no focal motor deficits Lungs clear with equal breath sounds bilaterally Cardiac regular rate and rhythm normal S1 and S2 no rubs or murmurs No carotid bruits  Diagnostic Tests: CT ANGIOGRAPHY CHEST WITH CONTRAST  TECHNIQUE: Multidetector CT imaging of the chest was  performed using the standard protocol during bolus administration of intravenous contrast. Multiplanar CT image reconstructions and MIPs were obtained to evaluate the vascular anatomy.  CONTRAST:  46mL ISOVUE-370 IOPAMIDOL (ISOVUE-370) INJECTION 76%  COMPARISON:  August 21, 2019.  FINDINGS: Cardiovascular: Grossly stable 4.2 cm ascending thoracic aortic aneurysm is noted. No dissection is noted. Great vessels are widely patent without significant stenosis. Stable cardiomegaly. No pericardial effusion. Mild coronary artery calcifications are noted.  Mediastinum/Nodes: No enlarged mediastinal, hilar, or axillary lymph nodes. Thyroid gland, trachea, and esophagus demonstrate no significant findings.  Lungs/Pleura: No pneumothorax or pleural effusion is noted. Left lung is clear. Grossly stable 12 x 6 mm nodule is noted in right upper lobe best seen on image number 38 of series 8; this can be considered benign at this point as noted on prior exam. Stable 19 x 7 mm nodule is seen along right hemidiaphragm most consistent with subpleural lymph node. This is best seen on image number 101 of series 8 and can be considered benign at this point as noted on prior exam.  Upper Abdomen: No acute abnormality.  Musculoskeletal: No chest wall abnormality. No acute or significant osseous findings.  Review of the MIP images confirms the above findings.  IMPRESSION: 1. Grossly stable 4.2 cm ascending thoracic aortic aneurysm. No dissection is noted. Recommend annual imaging followup by CTA or MRA. This recommendation follows 2010 ACCF/AHA/AATS/ACR/ASA/SCA/SCAI/SIR/STS/SVM Guidelines for the Diagnosis and Management of Patients with Thoracic Aortic Disease. Circulation. 2010; 121: A416-S063. Aortic aneurysm NOS (ICD10-I71.9). 2. Mild coronary artery calcifications are noted suggesting coronary artery disease. 3. Grossly stable right lung nodules are noted, with the  largest measuring 12 x 6 mm in the right upper lobe. These can be considered benign at this point as noted on prior exam. 4. Aortic atherosclerosis.  Aortic Atherosclerosis (ICD10-I70.0).   Electronically Signed   By: Marijo Conception M.D.   On: 09/18/2020 10:48 I personally reviewed the CT images.  The ascending aneurysm is unchanged as are the right lung nodules.  Impression: Timothy Moran is a 75 year old man with a past medical history significant for hypertension, pulmonary embolus, meningiomas, stage III chronic kidney disease, right upper and lower lobe lung nodules, ascending aortic aneurysm, left bundle branch block, and a benign spinal tumor.    Thoracic aortic atherosclerosis/ascending thoracic aneurysm.  Aorta stable at 4.2 cm.  Needs continued annual follow-up.  Blood pressure control is paramount.  Hypertension-blood pressure is elevated today.  It was normal at 122/84 when he saw [name] in November.  Advised him to check that at  home and if he is seeing values above 130 consistently might need additional medications.  Lung nodules-stable for 2 years.  Consistent with benign nodules.  Plan: Monitor blood pressure at home Return in 1 year with CT angiogram of chest  [place] Nakayama, MD [facility] [phone]

## 2020-10-04 ENCOUNTER — Emergency Department (HOSPITAL_COMMUNITY): Payer: Medicare Other

## 2020-10-04 ENCOUNTER — Encounter (HOSPITAL_COMMUNITY): Payer: Self-pay

## 2020-10-04 ENCOUNTER — Inpatient Hospital Stay (HOSPITAL_COMMUNITY)
Admission: EM | Admit: 2020-10-04 | Discharge: 2020-10-29 | DRG: 246 | Disposition: A | Discharge status: Rehabilitation Facility | Payer: Medicare Other | Attending: Internal Medicine | Admitting: Internal Medicine

## 2020-10-04 ENCOUNTER — Inpatient Hospital Stay (HOSPITAL_COMMUNITY): Payer: Medicare Other

## 2020-10-04 ENCOUNTER — Encounter (HOSPITAL_COMMUNITY)
Admission: EM | Disposition: A | Discharge status: Rehabilitation Facility | Payer: Self-pay | Source: Home / Self Care | Attending: Cardiology

## 2020-10-04 DIAGNOSIS — N1831 Chronic kidney disease, stage 3a: Secondary | ICD-10-CM | POA: Diagnosis present

## 2020-10-04 DIAGNOSIS — I2121 ST elevation (STEMI) myocardial infarction involving left circumflex coronary artery: Secondary | ICD-10-CM

## 2020-10-04 DIAGNOSIS — E119 Type 2 diabetes mellitus without complications: Secondary | ICD-10-CM

## 2020-10-04 DIAGNOSIS — D631 Anemia in chronic kidney disease: Secondary | ICD-10-CM | POA: Diagnosis present

## 2020-10-04 DIAGNOSIS — Z452 Encounter for adjustment and management of vascular access device: Secondary | ICD-10-CM | POA: Insufficient documentation

## 2020-10-04 DIAGNOSIS — R319 Hematuria, unspecified: Secondary | ICD-10-CM | POA: Diagnosis present

## 2020-10-04 DIAGNOSIS — J9600 Acute respiratory failure, unspecified whether with hypoxia or hypercapnia: Secondary | ICD-10-CM | POA: Diagnosis present

## 2020-10-04 DIAGNOSIS — R0600 Dyspnea, unspecified: Secondary | ICD-10-CM

## 2020-10-04 DIAGNOSIS — E872 Acidosis: Secondary | ICD-10-CM | POA: Diagnosis not present

## 2020-10-04 DIAGNOSIS — I213 ST elevation (STEMI) myocardial infarction of unspecified site: Secondary | ICD-10-CM | POA: Diagnosis present

## 2020-10-04 DIAGNOSIS — R4020 Unspecified coma: Secondary | ICD-10-CM | POA: Diagnosis not present

## 2020-10-04 DIAGNOSIS — D509 Iron deficiency anemia, unspecified: Secondary | ICD-10-CM | POA: Diagnosis not present

## 2020-10-04 DIAGNOSIS — I5043 Acute on chronic combined systolic (congestive) and diastolic (congestive) heart failure: Secondary | ICD-10-CM | POA: Diagnosis not present

## 2020-10-04 DIAGNOSIS — E1129 Type 2 diabetes mellitus with other diabetic kidney complication: Secondary | ICD-10-CM | POA: Diagnosis not present

## 2020-10-04 DIAGNOSIS — I462 Cardiac arrest due to underlying cardiac condition: Secondary | ICD-10-CM | POA: Diagnosis present

## 2020-10-04 DIAGNOSIS — R911 Solitary pulmonary nodule: Secondary | ICD-10-CM | POA: Diagnosis present

## 2020-10-04 DIAGNOSIS — Z86711 Personal history of pulmonary embolism: Secondary | ICD-10-CM | POA: Diagnosis present

## 2020-10-04 DIAGNOSIS — Z20822 Contact with and (suspected) exposure to covid-19: Secondary | ICD-10-CM | POA: Diagnosis present

## 2020-10-04 DIAGNOSIS — Z806 Family history of leukemia: Secondary | ICD-10-CM

## 2020-10-04 DIAGNOSIS — I7121 Aneurysm of the ascending aorta, without rupture: Secondary | ICD-10-CM | POA: Diagnosis present

## 2020-10-04 DIAGNOSIS — Z79899 Other long term (current) drug therapy: Secondary | ICD-10-CM

## 2020-10-04 DIAGNOSIS — R339 Retention of urine, unspecified: Secondary | ICD-10-CM | POA: Diagnosis not present

## 2020-10-04 DIAGNOSIS — Z87891 Personal history of nicotine dependence: Secondary | ICD-10-CM

## 2020-10-04 DIAGNOSIS — I472 Ventricular tachycardia: Secondary | ICD-10-CM | POA: Diagnosis present

## 2020-10-04 DIAGNOSIS — I5042 Chronic combined systolic (congestive) and diastolic (congestive) heart failure: Secondary | ICD-10-CM | POA: Diagnosis not present

## 2020-10-04 DIAGNOSIS — I517 Cardiomegaly: Secondary | ICD-10-CM | POA: Diagnosis not present

## 2020-10-04 DIAGNOSIS — D321 Benign neoplasm of spinal meninges: Secondary | ICD-10-CM | POA: Diagnosis present

## 2020-10-04 DIAGNOSIS — R34 Anuria and oliguria: Secondary | ICD-10-CM | POA: Diagnosis present

## 2020-10-04 DIAGNOSIS — G931 Anoxic brain damage, not elsewhere classified: Secondary | ICD-10-CM | POA: Diagnosis not present

## 2020-10-04 DIAGNOSIS — Z808 Family history of malignant neoplasm of other organs or systems: Secondary | ICD-10-CM | POA: Diagnosis not present

## 2020-10-04 DIAGNOSIS — I469 Cardiac arrest, cause unspecified: Secondary | ICD-10-CM | POA: Diagnosis present

## 2020-10-04 DIAGNOSIS — J9601 Acute respiratory failure with hypoxia: Secondary | ICD-10-CM | POA: Diagnosis present

## 2020-10-04 DIAGNOSIS — N170 Acute kidney failure with tubular necrosis: Secondary | ICD-10-CM | POA: Diagnosis not present

## 2020-10-04 DIAGNOSIS — D320 Benign neoplasm of cerebral meninges: Secondary | ICD-10-CM | POA: Diagnosis present

## 2020-10-04 DIAGNOSIS — I25118 Atherosclerotic heart disease of native coronary artery with other forms of angina pectoris: Secondary | ICD-10-CM | POA: Diagnosis present

## 2020-10-04 DIAGNOSIS — R1312 Dysphagia, oropharyngeal phase: Secondary | ICD-10-CM | POA: Diagnosis present

## 2020-10-04 DIAGNOSIS — G929 Unspecified toxic encephalopathy: Secondary | ICD-10-CM | POA: Diagnosis not present

## 2020-10-04 DIAGNOSIS — E870 Hyperosmolality and hypernatremia: Secondary | ICD-10-CM | POA: Diagnosis not present

## 2020-10-04 DIAGNOSIS — G9340 Encephalopathy, unspecified: Secondary | ICD-10-CM | POA: Diagnosis not present

## 2020-10-04 DIAGNOSIS — I4901 Ventricular fibrillation: Principal | ICD-10-CM | POA: Diagnosis present

## 2020-10-04 DIAGNOSIS — E1169 Type 2 diabetes mellitus with other specified complication: Secondary | ICD-10-CM | POA: Diagnosis not present

## 2020-10-04 DIAGNOSIS — D649 Anemia, unspecified: Secondary | ICD-10-CM | POA: Diagnosis not present

## 2020-10-04 DIAGNOSIS — I499 Cardiac arrhythmia, unspecified: Secondary | ICD-10-CM | POA: Diagnosis not present

## 2020-10-04 DIAGNOSIS — Y93H1 Activity, digging, shoveling and raking: Secondary | ICD-10-CM

## 2020-10-04 DIAGNOSIS — Z95810 Presence of automatic (implantable) cardiac defibrillator: Secondary | ICD-10-CM | POA: Diagnosis not present

## 2020-10-04 DIAGNOSIS — G4700 Insomnia, unspecified: Secondary | ICD-10-CM | POA: Diagnosis present

## 2020-10-04 DIAGNOSIS — Z4682 Encounter for fitting and adjustment of non-vascular catheter: Secondary | ICD-10-CM | POA: Diagnosis not present

## 2020-10-04 DIAGNOSIS — Z8674 Personal history of sudden cardiac arrest: Secondary | ICD-10-CM | POA: Diagnosis not present

## 2020-10-04 DIAGNOSIS — Z7982 Long term (current) use of aspirin: Secondary | ICD-10-CM

## 2020-10-04 DIAGNOSIS — Z833 Family history of diabetes mellitus: Secondary | ICD-10-CM | POA: Diagnosis not present

## 2020-10-04 DIAGNOSIS — N183 Chronic kidney disease, stage 3 unspecified: Secondary | ICD-10-CM | POA: Diagnosis not present

## 2020-10-04 DIAGNOSIS — J189 Pneumonia, unspecified organism: Secondary | ICD-10-CM | POA: Diagnosis present

## 2020-10-04 DIAGNOSIS — I272 Pulmonary hypertension, unspecified: Secondary | ICD-10-CM | POA: Diagnosis present

## 2020-10-04 DIAGNOSIS — I447 Left bundle-branch block, unspecified: Secondary | ICD-10-CM | POA: Diagnosis present

## 2020-10-04 DIAGNOSIS — I10 Essential (primary) hypertension: Secondary | ICD-10-CM | POA: Diagnosis not present

## 2020-10-04 DIAGNOSIS — J9602 Acute respiratory failure with hypercapnia: Secondary | ICD-10-CM | POA: Diagnosis not present

## 2020-10-04 DIAGNOSIS — I2510 Atherosclerotic heart disease of native coronary artery without angina pectoris: Secondary | ICD-10-CM | POA: Diagnosis not present

## 2020-10-04 DIAGNOSIS — J3489 Other specified disorders of nose and nasal sinuses: Secondary | ICD-10-CM | POA: Diagnosis not present

## 2020-10-04 DIAGNOSIS — R0789 Other chest pain: Secondary | ICD-10-CM | POA: Diagnosis not present

## 2020-10-04 DIAGNOSIS — I2119 ST elevation (STEMI) myocardial infarction involving other coronary artery of inferior wall: Secondary | ICD-10-CM | POA: Diagnosis present

## 2020-10-04 DIAGNOSIS — R579 Shock, unspecified: Secondary | ICD-10-CM | POA: Diagnosis present

## 2020-10-04 DIAGNOSIS — Z4901 Encounter for fitting and adjustment of extracorporeal dialysis catheter: Secondary | ICD-10-CM | POA: Diagnosis not present

## 2020-10-04 DIAGNOSIS — R4182 Altered mental status, unspecified: Secondary | ICD-10-CM | POA: Diagnosis not present

## 2020-10-04 DIAGNOSIS — N179 Acute kidney failure, unspecified: Secondary | ICD-10-CM | POA: Diagnosis present

## 2020-10-04 DIAGNOSIS — R0602 Shortness of breath: Secondary | ICD-10-CM

## 2020-10-04 DIAGNOSIS — E785 Hyperlipidemia, unspecified: Secondary | ICD-10-CM | POA: Diagnosis present

## 2020-10-04 DIAGNOSIS — D333 Benign neoplasm of cranial nerves: Secondary | ICD-10-CM | POA: Diagnosis present

## 2020-10-04 DIAGNOSIS — Z0189 Encounter for other specified special examinations: Secondary | ICD-10-CM | POA: Insufficient documentation

## 2020-10-04 DIAGNOSIS — R0683 Snoring: Secondary | ICD-10-CM | POA: Diagnosis present

## 2020-10-04 DIAGNOSIS — E1122 Type 2 diabetes mellitus with diabetic chronic kidney disease: Secondary | ICD-10-CM | POA: Diagnosis present

## 2020-10-04 DIAGNOSIS — Z86011 Personal history of benign neoplasm of the brain: Secondary | ICD-10-CM | POA: Diagnosis not present

## 2020-10-04 DIAGNOSIS — R42 Dizziness and giddiness: Secondary | ICD-10-CM | POA: Diagnosis present

## 2020-10-04 DIAGNOSIS — I509 Heart failure, unspecified: Secondary | ICD-10-CM

## 2020-10-04 DIAGNOSIS — I712 Thoracic aortic aneurysm, without rupture: Secondary | ICD-10-CM | POA: Diagnosis present

## 2020-10-04 DIAGNOSIS — I4891 Unspecified atrial fibrillation: Secondary | ICD-10-CM | POA: Diagnosis not present

## 2020-10-04 DIAGNOSIS — I130 Hypertensive heart and chronic kidney disease with heart failure and stage 1 through stage 4 chronic kidney disease, or unspecified chronic kidney disease: Secondary | ICD-10-CM | POA: Diagnosis not present

## 2020-10-04 DIAGNOSIS — R4189 Other symptoms and signs involving cognitive functions and awareness: Secondary | ICD-10-CM | POA: Diagnosis present

## 2020-10-04 DIAGNOSIS — I6782 Cerebral ischemia: Secondary | ICD-10-CM | POA: Diagnosis not present

## 2020-10-04 DIAGNOSIS — R079 Chest pain, unspecified: Secondary | ICD-10-CM | POA: Diagnosis not present

## 2020-10-04 DIAGNOSIS — Z955 Presence of coronary angioplasty implant and graft: Secondary | ICD-10-CM | POA: Diagnosis not present

## 2020-10-04 DIAGNOSIS — D638 Anemia in other chronic diseases classified elsewhere: Secondary | ICD-10-CM | POA: Diagnosis present

## 2020-10-04 DIAGNOSIS — G259 Extrapyramidal and movement disorder, unspecified: Secondary | ICD-10-CM | POA: Diagnosis not present

## 2020-10-04 DIAGNOSIS — D62 Acute posthemorrhagic anemia: Secondary | ICD-10-CM | POA: Diagnosis present

## 2020-10-04 DIAGNOSIS — J387 Other diseases of larynx: Secondary | ICD-10-CM | POA: Diagnosis not present

## 2020-10-04 DIAGNOSIS — J358 Other chronic diseases of tonsils and adenoids: Secondary | ICD-10-CM | POA: Diagnosis not present

## 2020-10-04 DIAGNOSIS — I255 Ischemic cardiomyopathy: Secondary | ICD-10-CM | POA: Diagnosis present

## 2020-10-04 DIAGNOSIS — Z95820 Peripheral vascular angioplasty status with implants and grafts: Secondary | ICD-10-CM

## 2020-10-04 DIAGNOSIS — R41840 Attention and concentration deficit: Secondary | ICD-10-CM | POA: Diagnosis present

## 2020-10-04 DIAGNOSIS — R0609 Other forms of dyspnea: Secondary | ICD-10-CM | POA: Diagnosis present

## 2020-10-04 DIAGNOSIS — E1165 Type 2 diabetes mellitus with hyperglycemia: Secondary | ICD-10-CM | POA: Diagnosis present

## 2020-10-04 DIAGNOSIS — J90 Pleural effusion, not elsewhere classified: Secondary | ICD-10-CM | POA: Diagnosis not present

## 2020-10-04 DIAGNOSIS — Z781 Physical restraint status: Secondary | ICD-10-CM

## 2020-10-04 DIAGNOSIS — D492 Neoplasm of unspecified behavior of bone, soft tissue, and skin: Secondary | ICD-10-CM | POA: Diagnosis present

## 2020-10-04 DIAGNOSIS — R2689 Other abnormalities of gait and mobility: Secondary | ICD-10-CM | POA: Diagnosis not present

## 2020-10-04 DIAGNOSIS — D72829 Elevated white blood cell count, unspecified: Secondary | ICD-10-CM | POA: Diagnosis not present

## 2020-10-04 HISTORY — PX: LEFT HEART CATH AND CORONARY ANGIOGRAPHY: CATH118249

## 2020-10-04 HISTORY — PX: CORONARY/GRAFT ACUTE MI REVASCULARIZATION: CATH118305

## 2020-10-04 HISTORY — DX: ST elevation (STEMI) myocardial infarction of unspecified site: I21.3

## 2020-10-04 LAB — MRSA PCR SCREENING: MRSA by PCR: NEGATIVE

## 2020-10-04 LAB — COMPREHENSIVE METABOLIC PANEL
ALT: 165 U/L — ABNORMAL HIGH (ref 0–44)
AST: 189 U/L — ABNORMAL HIGH (ref 15–41)
Albumin: 3.6 g/dL (ref 3.5–5.0)
Alkaline Phosphatase: 55 U/L (ref 38–126)
Anion gap: 13 (ref 5–15)
BUN: 20 mg/dL (ref 8–23)
CO2: 19 mmol/L — ABNORMAL LOW (ref 22–32)
Calcium: 8.6 mg/dL — ABNORMAL LOW (ref 8.9–10.3)
Chloride: 107 mmol/L (ref 98–111)
Creatinine, Ser: 1.86 mg/dL — ABNORMAL HIGH (ref 0.61–1.24)
GFR, Estimated: 37 mL/min — ABNORMAL LOW (ref >60)
Glucose, Bld: 217 mg/dL — ABNORMAL HIGH (ref 70–99)
Potassium: 4.3 mmol/L (ref 3.5–5.1)
Sodium: 139 mmol/L (ref 135–145)
Total Bilirubin: 0.7 mg/dL (ref 0.3–1.2)
Total Protein: 6.9 g/dL (ref 6.5–8.1)

## 2020-10-04 LAB — CBC WITH DIFFERENTIAL/PLATELET
Abs Immature Granulocytes: 0.21 10*3/uL — ABNORMAL HIGH (ref 0.00–0.07)
Basophils Absolute: 0.1 10*3/uL (ref 0.0–0.1)
Basophils Relative: 1 %
Eosinophils Absolute: 0.2 10*3/uL (ref 0.0–0.5)
Eosinophils Relative: 2 %
HCT: 45.2 % (ref 39.0–52.0)
Hemoglobin: 14.4 g/dL (ref 13.0–17.0)
Immature Granulocytes: 2 %
Lymphocytes Relative: 29 %
Lymphs Abs: 3.7 10*3/uL (ref 0.7–4.0)
MCH: 30.3 pg (ref 26.0–34.0)
MCHC: 31.9 g/dL (ref 30.0–36.0)
MCV: 95 fL (ref 80.0–100.0)
Monocytes Absolute: 0.6 10*3/uL (ref 0.1–1.0)
Monocytes Relative: 4 %
Neutro Abs: 8 10*3/uL — ABNORMAL HIGH (ref 1.7–7.7)
Neutrophils Relative %: 62 %
Platelets: 223 10*3/uL (ref 150–400)
RBC: 4.76 MIL/uL (ref 4.22–5.81)
RDW: 13.2 % (ref 11.5–15.5)
WBC: 12.7 10*3/uL — ABNORMAL HIGH (ref 4.0–10.5)
nRBC: 0 % (ref 0.0–0.2)

## 2020-10-04 LAB — PROTIME-INR
INR: 1.1 (ref 0.8–1.2)
Prothrombin Time: 13.5 seconds (ref 11.4–15.2)

## 2020-10-04 LAB — I-STAT ARTERIAL BLOOD GAS, ED
Acid-base deficit: 5 mmol/L — ABNORMAL HIGH (ref 0.0–2.0)
Bicarbonate: 21 mmol/L (ref 20.0–28.0)
Calcium, Ion: 1.21 mmol/L (ref 1.15–1.40)
HCT: 41 % (ref 39.0–52.0)
Hemoglobin: 13.9 g/dL (ref 13.0–17.0)
O2 Saturation: 100 %
Potassium: 4.2 mmol/L (ref 3.5–5.1)
Sodium: 139 mmol/L (ref 135–145)
TCO2: 22 mmol/L (ref 22–32)
pCO2 arterial: 40 mmHg (ref 32.0–48.0)
pH, Arterial: 7.329 — ABNORMAL LOW (ref 7.350–7.450)
pO2, Arterial: 201 mmHg — ABNORMAL HIGH (ref 83.0–108.0)

## 2020-10-04 LAB — I-STAT VENOUS BLOOD GAS, ED
Acid-base deficit: 7 mmol/L — ABNORMAL HIGH (ref 0.0–2.0)
Bicarbonate: 20 mmol/L (ref 20.0–28.0)
Calcium, Ion: 1.09 mmol/L — ABNORMAL LOW (ref 1.15–1.40)
HCT: 43 % (ref 39.0–52.0)
Hemoglobin: 14.6 g/dL (ref 13.0–17.0)
O2 Saturation: 86 %
Potassium: 4.3 mmol/L (ref 3.5–5.1)
Sodium: 140 mmol/L (ref 135–145)
TCO2: 21 mmol/L — ABNORMAL LOW (ref 22–32)
pCO2, Ven: 43.1 mmHg — ABNORMAL LOW (ref 44.0–60.0)
pH, Ven: 7.276 (ref 7.250–7.430)
pO2, Ven: 58 mmHg — ABNORMAL HIGH (ref 32.0–45.0)

## 2020-10-04 LAB — I-STAT CHEM 8, ED
BUN: 26 mg/dL — ABNORMAL HIGH (ref 8–23)
Calcium, Ion: 1.11 mmol/L — ABNORMAL LOW (ref 1.15–1.40)
Chloride: 109 mmol/L (ref 98–111)
Creatinine, Ser: 1.8 mg/dL — ABNORMAL HIGH (ref 0.61–1.24)
Glucose, Bld: 209 mg/dL — ABNORMAL HIGH (ref 70–99)
HCT: 43 % (ref 39.0–52.0)
Hemoglobin: 14.6 g/dL (ref 13.0–17.0)
Potassium: 4.3 mmol/L (ref 3.5–5.1)
Sodium: 141 mmol/L (ref 135–145)
TCO2: 20 mmol/L — ABNORMAL LOW (ref 22–32)

## 2020-10-04 LAB — SARS CORONAVIRUS 2 BY RT PCR (HOSPITAL ORDER, PERFORMED IN ~~LOC~~ HOSPITAL LAB): SARS Coronavirus 2: NEGATIVE

## 2020-10-04 LAB — BRAIN NATRIURETIC PEPTIDE: B Natriuretic Peptide: 51.6 pg/mL (ref 0.0–100.0)

## 2020-10-04 LAB — GLUCOSE, CAPILLARY: Glucose-Capillary: 179 mg/dL — ABNORMAL HIGH (ref 70–99)

## 2020-10-04 LAB — TROPONIN I (HIGH SENSITIVITY): Troponin I (High Sensitivity): 75 ng/L — ABNORMAL HIGH (ref <18)

## 2020-10-04 SURGERY — CORONARY/GRAFT ACUTE MI REVASCULARIZATION
Anesthesia: LOCAL

## 2020-10-04 MED ORDER — IOHEXOL 350 MG/ML SOLN
INTRAVENOUS | Status: DC | PRN
  Administered 2020-10-04: 130 mL

## 2020-10-04 MED ORDER — NITROGLYCERIN 1 MG/10 ML FOR IR/CATH LAB
INTRA_ARTERIAL | Status: AC
  Filled 2020-10-04: qty 10

## 2020-10-04 MED ORDER — VECURONIUM BROMIDE 10 MG IV SOLR
INTRAVENOUS | Status: AC
  Administered 2020-10-04: 10 mg
  Filled 2020-10-04: qty 10

## 2020-10-04 MED ORDER — SODIUM CHLORIDE 0.9% FLUSH
3.0000 mL | INTRAVENOUS | Status: DC | PRN
Start: 2020-10-05 — End: 2020-10-29

## 2020-10-04 MED ORDER — SODIUM CHLORIDE 0.9% FLUSH
3.0000 mL | Freq: Two times a day (BID) | INTRAVENOUS | Status: DC
  Administered 2020-10-05 – 2020-10-29 (×42): 3 mL via INTRAVENOUS

## 2020-10-04 MED ORDER — ADENOSINE 6 MG/2ML IV SOLN
INTRAVENOUS | Status: AC
  Filled 2020-10-04: qty 2

## 2020-10-04 MED ORDER — VERAPAMIL HCL 2.5 MG/ML IV SOLN
INTRAVENOUS | Status: AC
  Filled 2020-10-04: qty 2

## 2020-10-04 MED ORDER — FENTANYL CITRATE (PF) 100 MCG/2ML IJ SOLN: 25.0000 ug | Freq: Once | INTRAMUSCULAR | Status: DC

## 2020-10-04 MED ORDER — HEPARIN SODIUM (PORCINE) 1000 UNIT/ML IJ SOLN
INTRAMUSCULAR | Status: DC | PRN
  Administered 2020-10-04: 4000 [IU] via INTRAVENOUS
  Administered 2020-10-04: 5000 [IU] via INTRAVENOUS
  Administered 2020-10-04: 4000 [IU] via INTRAVENOUS

## 2020-10-04 MED ORDER — SODIUM CHLORIDE 0.9 % IV SOLN: INTRAVENOUS | Status: AC

## 2020-10-04 MED ORDER — CANGRELOR BOLUS VIA INFUSION
INTRAVENOUS | Status: DC | PRN
  Administered 2020-10-04: 3090 ug via INTRAVENOUS

## 2020-10-04 MED ORDER — ASPIRIN 300 MG RE SUPP
300.0000 mg | Freq: Once | RECTAL | Status: AC
  Administered 2020-10-04: 300 mg via RECTAL
  Filled 2020-10-04: qty 1

## 2020-10-04 MED ORDER — HEPARIN SODIUM (PORCINE) 5000 UNIT/ML IJ SOLN
4000.0000 [IU] | Freq: Once | INTRAMUSCULAR | Status: AC
  Administered 2020-10-04: 4000 [IU] via INTRAVENOUS
  Filled 2020-10-04: qty 1

## 2020-10-04 MED ORDER — NOREPINEPHRINE 4 MG/250ML-% IV SOLN: 0.0000 ug/min | INTRAVENOUS | Status: DC

## 2020-10-04 MED ORDER — CANGRELOR TETRASODIUM 50 MG IV SOLR
INTRAVENOUS | Status: AC
  Filled 2020-10-04: qty 50

## 2020-10-04 MED ORDER — FENTANYL BOLUS VIA INFUSION
25.0000 ug | INTRAVENOUS | Status: DC | PRN
  Filled 2020-10-04: qty 25

## 2020-10-04 MED ORDER — MIDAZOLAM HCL 2 MG/2ML IJ SOLN
INTRAMUSCULAR | Status: DC | PRN
  Administered 2020-10-04: 2 mg via INTRAVENOUS

## 2020-10-04 MED ORDER — TIROFIBAN HCL IV 12.5 MG/250 ML
0.0750 ug/kg/min | INTRAVENOUS | Status: AC
  Filled 2020-10-04: qty 250

## 2020-10-04 MED ORDER — FENTANYL 2500MCG IN NS 250ML (10MCG/ML) PREMIX INFUSION
25.0000 ug/h | INTRAVENOUS | Status: DC
  Administered 2020-10-05 (×3): 200 ug/h via INTRAVENOUS
  Filled 2020-10-04 (×4): qty 250

## 2020-10-04 MED ORDER — ENOXAPARIN SODIUM 40 MG/0.4ML ~~LOC~~ SOLN
40.0000 mg | SUBCUTANEOUS | Status: DC
  Administered 2020-10-06: 40 mg via SUBCUTANEOUS
  Filled 2020-10-04: qty 0.4

## 2020-10-04 MED ORDER — ASPIRIN 81 MG PO CHEW: 81.0000 mg | CHEWABLE_TABLET | Freq: Every day | ORAL | Status: DC

## 2020-10-04 MED ORDER — TICAGRELOR 90 MG PO TABS: 90.0000 mg | ORAL_TABLET | Freq: Two times a day (BID) | ORAL | Status: DC

## 2020-10-04 MED ORDER — MIDAZOLAM HCL 2 MG/2ML IJ SOLN
INTRAMUSCULAR | Status: AC
  Filled 2020-10-04: qty 2

## 2020-10-04 MED ORDER — NITROGLYCERIN 1 MG/10 ML FOR IR/CATH LAB
INTRA_ARTERIAL | Status: DC | PRN
  Administered 2020-10-04 (×2): 100 ug

## 2020-10-04 MED ORDER — HEPARIN (PORCINE) 25000 UT/250ML-% IV SOLN
1400.0000 [IU]/h | INTRAVENOUS | Status: DC
  Filled 2020-10-04: qty 250

## 2020-10-04 MED ORDER — LABETALOL HCL 5 MG/ML IV SOLN: 10.0000 mg | INTRAVENOUS | Status: AC | PRN

## 2020-10-04 MED ORDER — SODIUM CHLORIDE 0.9 % IV BOLUS: 1000.0000 mL | Freq: Once | INTRAVENOUS | Status: DC

## 2020-10-04 MED ORDER — FENTANYL CITRATE (PF) 100 MCG/2ML IJ SOLN
100.0000 ug | Freq: Once | INTRAMUSCULAR | Status: AC
  Administered 2020-10-04: 100 ug via INTRAVENOUS
  Filled 2020-10-04: qty 2

## 2020-10-04 MED ORDER — LIDOCAINE HCL (PF) 1 % IJ SOLN
INTRAMUSCULAR | Status: AC
  Filled 2020-10-04: qty 30

## 2020-10-04 MED ORDER — CHLORHEXIDINE GLUCONATE CLOTH 2 % EX PADS
6.0000 | MEDICATED_PAD | Freq: Every day | CUTANEOUS | Status: DC
  Administered 2020-10-06 – 2020-10-20 (×14): 6 via TOPICAL

## 2020-10-04 MED ORDER — ONDANSETRON HCL 4 MG/2ML IJ SOLN: 4.0000 mg | Freq: Four times a day (QID) | INTRAMUSCULAR | Status: DC | PRN

## 2020-10-04 MED ORDER — SODIUM CHLORIDE 0.9 % IV SOLN
250.0000 mL | INTRAVENOUS | Status: DC | PRN
  Administered 2020-10-05: 250 mL via INTRAVENOUS

## 2020-10-04 MED ORDER — SODIUM CHLORIDE 0.9 % IV SOLN
INTRAVENOUS | Status: DC | PRN
  Administered 2020-10-04: 4 ug/kg/min via INTRAVENOUS

## 2020-10-04 MED ORDER — "THROMBI-PAD 3""X3"" EX PADS"
1.0000 | MEDICATED_PAD | Freq: Once | CUTANEOUS | Status: DC
  Filled 2020-10-04 (×3): qty 1

## 2020-10-04 MED ORDER — VERAPAMIL HCL 2.5 MG/ML IV SOLN
INTRAVENOUS | Status: DC | PRN
  Administered 2020-10-04: 10 mL via INTRA_ARTERIAL

## 2020-10-04 MED ORDER — MIDAZOLAM HCL 2 MG/2ML IJ SOLN
2.0000 mg | INTRAMUSCULAR | Status: DC | PRN
  Administered 2020-10-04 – 2020-10-06 (×6): 2 mg via INTRAVENOUS
  Filled 2020-10-04 (×6): qty 2

## 2020-10-04 MED ORDER — LIDOCAINE HCL (PF) 1 % IJ SOLN
INTRAMUSCULAR | Status: DC | PRN
  Administered 2020-10-04: 2 mL

## 2020-10-04 MED ORDER — AMIODARONE HCL IN DEXTROSE 360-4.14 MG/200ML-% IV SOLN
30.0000 mg/h | INTRAVENOUS | Status: DC
  Administered 2020-10-04 – 2020-10-11 (×15): 30 mg/h via INTRAVENOUS
  Filled 2020-10-04 (×16): qty 200

## 2020-10-04 MED ORDER — ACETAMINOPHEN 325 MG PO TABS: 650.0000 mg | ORAL_TABLET | ORAL | Status: DC | PRN

## 2020-10-04 MED ORDER — TIROFIBAN HCL IN NACL 5-0.9 MG/100ML-% IV SOLN
INTRAVENOUS | Status: AC
  Filled 2020-10-04: qty 100

## 2020-10-04 MED ORDER — HEPARIN (PORCINE) IN NACL 1000-0.9 UT/500ML-% IV SOLN
INTRAVENOUS | Status: DC | PRN
  Administered 2020-10-04 (×2): 500 mL

## 2020-10-04 MED ORDER — TICAGRELOR 90 MG PO TABS
180.0000 mg | ORAL_TABLET | Freq: Once | ORAL | Status: AC
  Administered 2020-10-04: 180 mg
  Filled 2020-10-04: qty 2

## 2020-10-04 MED ORDER — TIROFIBAN (AGGRASTAT) BOLUS VIA INFUSION
INTRAVENOUS | Status: DC | PRN
  Administered 2020-10-04: 2575 ug via INTRAVENOUS

## 2020-10-04 MED ORDER — ADENOSINE (DIAGNOSTIC) FOR INTRACORONARY USE
INTRAVENOUS | Status: DC | PRN
  Administered 2020-10-04: 30 ug via INTRACORONARY
  Administered 2020-10-04 (×2): 60 ug via INTRACORONARY
  Administered 2020-10-04 (×3): 30 ug via INTRACORONARY

## 2020-10-04 MED ORDER — TIROFIBAN HCL IN NACL 5-0.9 MG/100ML-% IV SOLN
INTRAVENOUS | Status: AC | PRN
  Administered 2020-10-04: 0.075 ug/kg/min via INTRAVENOUS

## 2020-10-04 MED ORDER — HEPARIN SODIUM (PORCINE) 1000 UNIT/ML IJ SOLN
INTRAMUSCULAR | Status: AC
  Filled 2020-10-04: qty 1

## 2020-10-04 MED ORDER — PANTOPRAZOLE SODIUM 40 MG IV SOLR
40.0000 mg | Freq: Every day | INTRAVENOUS | Status: DC
  Administered 2020-10-05 – 2020-10-06 (×3): 40 mg via INTRAVENOUS
  Filled 2020-10-04 (×3): qty 40

## 2020-10-04 MED ORDER — VECURONIUM BROMIDE 10 MG IV SOLR: 10.0000 mg | Freq: Once | INTRAVENOUS | Status: AC

## 2020-10-04 MED ORDER — AMIODARONE HCL IN DEXTROSE 360-4.14 MG/200ML-% IV SOLN
60.0000 mg/h | INTRAVENOUS | Status: DC
  Administered 2020-10-04: 60 mg/h via INTRAVENOUS
  Filled 2020-10-04 (×2): qty 200

## 2020-10-04 MED ORDER — HYDRALAZINE HCL 20 MG/ML IJ SOLN: 10.0000 mg | INTRAMUSCULAR | Status: AC | PRN

## 2020-10-04 MED ORDER — ROSUVASTATIN CALCIUM 20 MG PO TABS: 20.0000 mg | ORAL_TABLET | Freq: Every day | ORAL | Status: DC

## 2020-10-04 MED ORDER — HEPARIN (PORCINE) IN NACL 1000-0.9 UT/500ML-% IV SOLN
INTRAVENOUS | Status: AC
  Filled 2020-10-04: qty 1000

## 2020-10-04 SURGICAL SUPPLY — 19 items
BAG SNAP BAND KOVER 36X36 (MISCELLANEOUS) ×2 IMPLANT
BALLN SAPPHIRE 2.0X12 (BALLOONS) ×2
BALLN SAPPHIRE ~~LOC~~ 2.5X15 (BALLOONS) ×2 IMPLANT
BALLOON SAPPHIRE 2.0X12 (BALLOONS) ×1 IMPLANT
CATH EXTRAC PRONTO 5.5F 138CM (CATHETERS) ×2 IMPLANT
CATH INFINITI 5 FR JL3.5 (CATHETERS) ×2 IMPLANT
CATH INFINITI JR4 5F (CATHETERS) ×2 IMPLANT
CATH LAUNCHER 6FR EBU 4 (CATHETERS) ×2 IMPLANT
COVER DOME SNAP 22 D (MISCELLANEOUS) ×2 IMPLANT
DEVICE RAD COMP TR BAND LRG (VASCULAR PRODUCTS) ×4 IMPLANT
GLIDESHEATH SLEND SS 6F .021 (SHEATH) ×2 IMPLANT
GUIDEWIRE INQWIRE 1.5J.035X260 (WIRE) ×1 IMPLANT
INQWIRE 1.5J .035X260CM (WIRE) ×2
KIT HEART LEFT (KITS) ×2 IMPLANT
PACK CARDIAC CATHETERIZATION (CUSTOM PROCEDURE TRAY) ×2 IMPLANT
STENT RESOLUTE ONYX 2.5X30 (Permanent Stent) ×2 IMPLANT
TRANSDUCER W/STOPCOCK (MISCELLANEOUS) ×2 IMPLANT
TUBING CIL FLEX 10 FLL-RA (TUBING) ×2 IMPLANT
WIRE RUNTHROUGH .014X180CM (WIRE) ×2 IMPLANT

## 2020-10-04 NOTE — Progress Notes (Signed)
ANTICOAGULATION CONSULT NOTE - Initial Consult  Pharmacy Consult for IV heparin > aggrastat Indication: STEMI  No Known Allergies  Patient Measurements: Height: 6\' 1"  (185.4 cm) Weight: 103 kg (227 lb 1.2 oz) IBW/kg (Calculated) : 79.9 Heparin Dosing Weight: 100 kg  Vital Signs: Temp: 97.4 F (36.3 C) (01/23 1830) Temp Source: Rectal (01/23 1756) BP: 102/69 (01/23 2130) Pulse Rate: 62 (01/23 2130)  Labs: Recent Labs    10/04/20 1802 10/04/20 1803 10/04/20 1808 10/04/20 1821  HGB 14.6 14.6 14.4 13.9  HCT 43.0 43.0 45.2 41.0  PLT  --   --  223  --   LABPROT  --   --  13.5  --   INR  --   --  1.1  --   CREATININE 1.80*  --  1.86*  --   TROPONINIHS  --   --  75*  --     Estimated Creatinine Clearance: 42.6 mL/min (A) (by C-G formula based on SCr of 1.86 mg/dL (H)).   Medical History: Past Medical History:  Diagnosis Date  . Abnormal echocardiogram 06/07/2019  . Arrhythmia   . Ascending aortic aneurysm (HCC) 4.2 cm based on CT from December 2020 01/29/2018   4.3 cm by echo in 2019  . Benign brain tumor ([place])   . Benign neoplasm of brain ([place]) 12/19/2017  . Chest pain 12/04/2017  . Chronic kidney disease    Stage 3  . Chronic kidney disease, stage III (moderate) ([place]) 12/19/2017  . Dizziness 10/11/2019  . History of pulmonary embolus (PE)   . Hypertension   . Left bundle branch block 12/19/2017  . Nonsustained ventricular tachycardia ([place]) 10/11/2019  . Pain of left hip joint 05/29/2020  . Personal history of pulmonary embolism 12/19/2017  . Pulmonary hypertension due to thromboembolism ([place]) 03/09/2018   See echo 12/27/17 with nl PAS vs CTa chest 02/23/18   . Sinus bradycardia 12/22/2017  . Solitary pulmonary nodule on lung CT 03/08/2018   CT 12/04/17 1.0 x 0.8 x 0.7 cm nodular opacity in the RUL vs not seen 03/16/10 posterior segment of the right upper lobe. Marland KitchenSpirometry 03/08/2018    FEV1 3.86 (108%)  Ratio 96 s prior rx  - PET  03/13/18   Low grade c/w adenoca > rec T surgery eval       Medications:  Infusions:  . sodium chloride    . [START ON 10/05/2020] sodium chloride    . amiodarone 60 mg/hr (10/04/20 1809)   Followed by  . [START ON 10/05/2020] amiodarone    . fentaNYL infusion INTRAVENOUS    . tirofiban      Assessment: 75 yo male s/p VF arrest,  STEMI, pharmacy asked to begin anticoagulation with IV heparin.  Awaiting cath lab.  Now s/p cath lab, heparin not resumed, planning tirofiban x total 4 hrs.  Goal of Therapy:  Heparin level 0.3-0.7 units/ml Monitor platelets by anticoagulation protocol: Yes   Plan:  Heparin off Tirofiban 0.075 mcg/kg/min x total 4 hrs.  [place] Crane, Roylene Reason, BCCP Clinical Pharmacist  10/04/2020 9:42 PM   [facility] pharmacy phone numbers are listed on [email or web address]

## 2020-10-04 NOTE — Progress Notes (Signed)
ETT tube advanced 2 cm per MD order now secured at 26 cm at lips.

## 2020-10-04 NOTE — Progress Notes (Signed)
Patient transported to CT and back to 030 without incidence by RT.

## 2020-10-04 NOTE — ED Provider Notes (Signed)
[facility] EMERGENCY DEPARTMENT Provider Note   CSN: [id number] Arrival date & time: 10/04/20  1746     History Chief Complaint  Patient presents with  . post cpr    Timothy Moran is a 75 y.o. male.  The history is provided by medical records and the spouse.   Timothy Moran is a 75 y.o. male who presents to the Emergency Department complaining of cardiac arrest. Level V caveat due to unresponsiveness. History is provided by EMS since the patient's wife. He presents the emergency department by EMS following witnessed cardiac arrest. Per report he complained of headache, chest pain to his wife as well as shortness of breath and EMS was called. While the wife was on the phone with dispatch the patient had a cardiac arrest and she started compressions per recommendations by dispatch. Fire was on scene within 2 to 3 minutes of call and he was different related once by fire and three times by EMS for a total of four different relations for V. fib arrest. Estimated downtime is 2 to 3 minutes per EMS. He received one epinephrine, 300 mg amnio and 2.5 mg and for said due to binding of the tube. He did have an endotracheal tube placed, 7-0 prior to ED arrival.      Past Medical History:  Diagnosis Date  . Abnormal echocardiogram 06/07/2019  . Arrhythmia   . Ascending aortic aneurysm (HCC) 4.2 cm based on CT from December 2020 01/29/2018   4.3 cm by echo in 2019  . Benign brain tumor ([place])   . Benign neoplasm of brain ([place]) 12/19/2017  . Chest pain 12/04/2017  . Chronic kidney disease    Stage 3  . Chronic kidney disease, stage III (moderate) ([place]) 12/19/2017  . Dizziness 10/11/2019  . History of pulmonary embolus (PE)   . Hypertension   . Left bundle branch block 12/19/2017  . Nonsustained ventricular tachycardia ([place]) 10/11/2019  . Pain of left hip joint 05/29/2020  . Personal history of pulmonary embolism 12/19/2017  . Pulmonary hypertension due to thromboembolism  ([place]) 03/09/2018   See echo 12/27/17 with nl PAS vs CTa chest 02/23/18   . Sinus bradycardia 12/22/2017  . Solitary pulmonary nodule on lung CT 03/08/2018   CT 12/04/17 1.0 x 0.8 x 0.7 cm nodular opacity in the RUL vs not seen 03/16/10 posterior segment of the right upper lobe. Marland KitchenSpirometry 03/08/2018    FEV1 3.86 (108%)  Ratio 96 s prior rx  - PET  03/13/18   Low grade c/w adenoca > rec T surgery eval      Patient Active Problem List   Diagnosis Date Noted  . Arrhythmia   . Pain of left hip joint 05/29/2020  . Benign brain tumor ([place])   . Chronic kidney disease   . History of pulmonary embolus (PE)   . Hypertension   . Nonsustained ventricular tachycardia ([place]) 10/11/2019  . Dizziness 10/11/2019  . Abnormal echocardiogram 06/07/2019  . Pulmonary hypertension due to thromboembolism ([place]) 03/09/2018  . Solitary pulmonary nodule on lung CT 03/08/2018  . Ascending aortic aneurysm (HCC) 4.2 cm based on CT from December 2020 01/29/2018  . Sinus bradycardia 12/22/2017  . Left bundle branch block 12/19/2017  . Personal history of pulmonary embolism 12/19/2017  . Chronic kidney disease, stage III (moderate) ([place]) 12/19/2017  . Benign neoplasm of brain ([place]) 12/19/2017  . Chest pain 12/04/2017    Past Surgical History:  Procedure Laterality Date  . APPENDECTOMY    .  HERNIA REPAIR         Family History  Problem Relation Age of Onset  . Diabetes Mother   . Brain cancer Sister   . Melanoma Brother        Radiation Exposure  . Leukemia Brother        Agent Orange    Social History   Tobacco Use  . Smoking status: Former Smoker    Packs/day: 0.50    Years: 5.00    Pack years: 2.50    Types: Cigarettes    Quit date: 09/12/1968    Years since quitting: 52.0  . Smokeless tobacco: Never Used  Vaping Use  . Vaping Use: Never used  Substance Use Topics  . Alcohol use: Yes    Comment: ONE PER WEEK  . Drug use: Never    Home Medications Prior to Admission medications   Medication Sig  Start Date End Date Taking? Authorizing Provider  Azelastine HCl 137 MCG/SPRAY SOLN Place 1 spray into both nostrils at bedtime. 08/13/18   [provider]  B Complex-C (SUPER B COMPLEX PO) Take 1 tablet by mouth daily.    [provider]  Cholecalciferol (VITAMIN D3) 5000 units TABS Take 1 tablet by mouth daily.     [provider]  finasteride (PROSCAR) 5 MG tablet Take 5 mg by mouth daily. 10/17/19   [provider]  Magnesium 400 MG CAPS Take 400 mg by mouth daily.     [provider]  nitroGLYCERIN (NITROSTAT) 0.4 MG SL tablet Place 1 tablet (0.4 mg total) under the tongue every 5 (five) minutes as needed for chest pain. 05/27/20 08/25/20  Tobb, Godfrey Pick, DO  Omega-3 Fatty Acids (FISH OIL) 1360 MG CAPS Take 2 capsules by mouth daily.    [provider]  rosuvastatin (CRESTOR) 20 MG tablet Take 1 tablet (20 mg total) by mouth daily. 08/03/20 11/01/20  Park Liter, MD  tamsulosin (FLOMAX) 0.4 MG CAPS capsule Take 2 capsules by mouth daily.  06/29/18   [provider]  valsartan (DIOVAN) 160 MG tablet Take 1 tablet (160 mg total) by mouth daily. 09/07/20   Park Liter, MD    Allergies    Patient has no known allergies.  Review of Systems   Review of Systems  Unable to perform ROS: Patient unresponsive    Physical Exam Updated Vital Signs BP 122/84   Pulse 73   Temp (!) 96.7 F (35.9 C) (Rectal)   Resp 18   Ht 6\' 1"  (1.854 m)   Wt 103 kg   SpO2 99%   BMI 29.96 kg/m   Physical Exam Vitals and nursing note reviewed.  Constitutional:      General: He is in acute distress.     Appearance: He is well-developed and well-nourished. He is ill-appearing.  HENT:     Head: Normocephalic and atraumatic.  Eyes:     Comments: Pupils midsize and reactive  Cardiovascular:     Rate and Rhythm: Normal rate and regular rhythm.     Heart sounds: No murmur heard.   Pulmonary:     Effort: Pulmonary effort is normal.  No respiratory distress.     Breath sounds: Normal breath sounds.     Comments: 7-0 endotracheal tube in place with good air movement bilaterally Abdominal:     Palpations: Abdomen is soft.     Tenderness: There is no abdominal tenderness. There is no guarding or rebound.  Musculoskeletal:  General: No tenderness or edema.     Comments: No significant lower extremity edema. There is an I/O in the left anterior tibia.  Skin:    General: Skin is warm and dry.     Capillary Refill: Capillary refill takes less than 2 seconds.  Neurological:     Comments: GCS 1-1-1, positive gag.  Psychiatric:        Mood and Affect: Mood and affect normal.     Comments: Unable to assess     ED Results / Procedures / Treatments   Labs (all labs ordered are listed, but only abnormal results are displayed) Labs Reviewed  I-STAT CHEM 8, ED - Abnormal; Notable for the following components:      Result Value   BUN 26 (*)    Creatinine, Ser 1.80 (*)    Glucose, Bld 209 (*)    Calcium, Ion 1.11 (*)    TCO2 20 (*)    All other components within normal limits  I-STAT VENOUS BLOOD GAS, ED - Abnormal; Notable for the following components:   pCO2, Ven 43.1 (*)    pO2, Ven 58.0 (*)    TCO2 21 (*)    Acid-base deficit 7.0 (*)    Calcium, Ion 1.09 (*)    All other components within normal limits  SARS CORONAVIRUS 2 BY RT PCR (HOSPITAL ORDER, [facility])  COMPREHENSIVE METABOLIC PANEL  BRAIN NATRIURETIC PEPTIDE  CBC WITH DIFFERENTIAL/PLATELET  PROTIME-INR  BLOOD GAS, VENOUS  TROPONIN I (HIGH SENSITIVITY)    EKG None  Radiology No results found.  Procedures OG placement  Date/Time: 10/04/2020 7:20 PM Performed by: Quintella Reichert, MD Authorized by: Quintella Reichert, MD  Consent: The procedure was performed in an emergent situation. Patient identity confirmed: arm band Local anesthesia used: no  Anesthesia: Local anesthesia used: no  Sedation: Patient  sedated: no  Patient tolerance: patient tolerated the procedure well with no immediate complications    (including critical care time) CRITICAL CARE Performed by: Quintella Reichert   Total critical care time: 45 minutes  Critical care time was exclusive of separately billable procedures and treating other patients.  Critical care was necessary to treat or prevent imminent or life-threatening deterioration.  Critical care was time spent personally by me on the following activities: development of treatment plan with patient and/or surrogate as well as nursing, discussions with consultants, evaluation of patient's response to treatment, examination of patient, obtaining history from patient or surrogate, ordering and performing treatments and interventions, ordering and review of laboratory studies, ordering and review of radiographic studies, pulse oximetry and re-evaluation of patient's condition.  Medications Ordered in ED Medications  amiodarone (NEXTERONE PREMIX) 360-4.14 MG/200ML-% (1.8 mg/mL) IV infusion (60 mg/hr Intravenous New Bag/Given 10/04/20 1809)    Followed by  amiodarone (NEXTERONE PREMIX) 360-4.14 MG/200ML-% (1.8 mg/mL) IV infusion (has no administration in time range)    ED Course  I have reviewed the triage vital signs and the nursing notes.  Pertinent labs & imaging results that were available during my care of the patient were reviewed by me and considered in my medical decision making (see chart for details).    MDM Rules/Calculators/A&P                         Patient presents to the emergency department status post V. fib arrest with return of spontaneous circulation. EKG on ED presentation with left bundle branch block with new ST elevations concerning  for acute ST elevation MI and setting of recent arrest. Discussed with STEMI physician, Dr. Saunders Revel, team will see the patient in the emergency department. He was treated with amiodarone drip plan to obtain CT head  prior to transfer to Cath Lab for emergent cath given reported history of meningiomas and recent headaches. Discussed with patient's wife critical nature of illness and plan to admit for further treatment.  Final Clinical Impression(s) / ED Diagnoses Final diagnoses:  Cardiac arrest with ventricular fibrillation [facility]    Rx / DC Orders ED Discharge Orders    None       Quintella Reichert, MD 10/04/20 2345

## 2020-10-04 NOTE — Consult Note (Signed)
NAME:  Quindarrius Joplin, MRN:  [id number], DOB:  [birth year], LOS: 0 ADMISSION DATE:  10/04/2020, CONSULTATION DATE: October 05, 2019 REFERRING MD: Cardiology, CHIEF COMPLAINT: Cardiac arrest  Brief History:  Patient 75 year old [place] male postcardiac arrest witnessed by wife V. fib shocked 4x2 times epi patient went to the Cath Lab he had stent and circumflex came up unresponsive nonpurposeful movements initiated hypothermia protocol.  History of Present Illness:  He is a 52 [place] male pulmonary embolus hypertension angioma stage III chronic kidney disease lung nodules ascending aortic aneurysm left bundle branch block Spine internal tumor was seen by cardiothoracic surgery for possible biopsy wedge resection today he was shoveling snow chest pain wife called EMS he had V. fib arrest estimated downtime 3 minutes received multiple epi 300 apnea and was shocked 4 times came here code STEMI was initiated he had went to the Cath Lab where he had stent in the circumflex artery  Past Medical History:  History of meningioma spinal tumor benign lung nodules PET positive chronic kidney disease stage IV hypertension hyperlipidemia ascending aortic aneurysm left bundle branch block  Significant Hospital Events:  Cardiac cath with cardiac stent  Consults:  Cardiology  Procedures:  Cardiac cath stent ET tube brain CT neck collar central line NG tube Foley catheter  Objective   Blood pressure 104/87, pulse 82, temperature (!) 96.7 F (35.9 C), temperature source Axillary, resp. rate 16, height 6\' 1"  (1.854 m), weight 103 kg, SpO2 100 %.    Vent Mode: PRVC FiO2 (%):  [100 %] 100 % Set Rate:  [16 bmp] 16 bmp Vt Set:  [630 mL] 630 mL PEEP:  [5 cmH20] 5 cmH20 Plateau Pressure:  [16 cmH20] 16 cmH20  No intake or output data in the 24 hours ending 10/04/20 2249 Filed Weights   10/04/20 1758  Weight: 103 kg    Examination: General:  Intubated sedated very unresponsive nonpurposeful  movements HEENT: Normal           Neck: Neck collar no evidence of trauma Lungs:  Resp regular and unlabored, CTA bilaterally. Heart: Regular rhythm, no s3, s4, or murmurs. Abdomen: Soft, non-tender, non-distended, BS +.  Extremities: Warm. No clubbing, cyanosis or edema. DP/PT/Radials 2+ and equal bilaterally. Psych: Cannot be assessed Neuro: Irritable agitated not responsive no purposeful movements   Assessment & Plan:  --Postcardiac arrest STEMI with post stent continue with anticoagulation cardiology is driving the plan. --Possible brain anoxia male with agitation nonpurposeful movement we will utilize hypothermia protocol 36 degrees line is applied. --Neck collar keep for now when patient more stable can go for cervical CAT scan. --Continue with Aggrastat Lovenox and Brilinta. --For lung nodules meningioma spinal cord tumor will can be managed as an outpatient. --We will start the patient on Protonix and Lovenox critical care time spent in the care of this patient is 49 minutes.    Labs   CBC: Recent Labs  Lab 10/04/20 1802 10/04/20 1803 10/04/20 1808 10/04/20 1821  WBC  --   --  12.7*  --   NEUTROABS  --   --  8.0*  --   HGB 14.6 14.6 14.4 13.9  HCT 43.0 43.0 45.2 41.0  MCV  --   --  95.0  --   PLT  --   --  223  --     Basic Metabolic Panel: Recent Labs  Lab 10/04/20 1802 10/04/20 1803 10/04/20 1808 10/04/20 1821  NA 141 140 139 139  K 4.3 4.3 4.3 4.2  CL 109  --  107  --   CO2  --   --  19*  --   GLUCOSE 209*  --  217*  --   BUN 26*  --  20  --   CREATININE 1.80*  --  1.86*  --   CALCIUM  --   --  8.6*  --    GFR: Estimated Creatinine Clearance: 42.6 mL/min (A) (by C-G formula based on SCr of 1.86 mg/dL (H)). Recent Labs  Lab 10/04/20 1808  WBC 12.7*    Liver Function Tests: Recent Labs  Lab 10/04/20 1808  AST 189*  ALT 165*  ALKPHOS 55  BILITOT 0.7  PROT 6.9  ALBUMIN 3.6   No results for input(s): LIPASE, AMYLASE in the last 168  hours. No results for input(s): AMMONIA in the last 168 hours.  ABG    Component Value Date/Time   PHART 7.329 (L) 10/04/2020 1821   PCO2ART 40.0 10/04/2020 1821   PO2ART 201 (H) 10/04/2020 1821   HCO3 21.0 10/04/2020 1821   TCO2 22 10/04/2020 1821   ACIDBASEDEF 5.0 (H) 10/04/2020 1821   O2SAT 100.0 10/04/2020 1821     Coagulation Profile: Recent Labs  Lab 10/04/20 1808  INR 1.1    Cardiac Enzymes: No results for input(s): CKTOTAL, CKMB, CKMBINDEX, TROPONINI in the last 168 hours.  HbA1C: No results found for: HGBA1C  CBG: No results for input(s): GLUCAP in the last 168 hours.  Review of Systems:   Unable to obtain due to patient condition  Past Medical History:  He,  has a past medical history of Abnormal echocardiogram (06/07/2019), Arrhythmia, Ascending aortic aneurysm (HCC) 4.2 cm based on CT from December 2020 (01/29/2018), Benign brain tumor [facility]), Benign neoplasm of brain ([place]) (12/19/2017), Chest pain (12/04/2017), Chronic kidney disease, Chronic kidney disease, stage III (moderate) ([place]) (12/19/2017), Dizziness (10/11/2019), History of pulmonary embolus (PE), Hypertension, Left bundle branch block (12/19/2017), Nonsustained ventricular tachycardia ([place]) (10/11/2019), Pain of left hip joint (05/29/2020), Personal history of pulmonary embolism (12/19/2017), Pulmonary hypertension due to thromboembolism ([place]) (03/09/2018), Sinus bradycardia (12/22/2017), and Solitary pulmonary nodule on lung CT (03/08/2018).   Surgical History:   Past Surgical History:  Procedure Laterality Date  . APPENDECTOMY    . HERNIA REPAIR       Social History:   reports that he quit smoking about 52 years ago. His smoking use included cigarettes. He has a 2.50 pack-year smoking history. He has never used smokeless tobacco. He reports current alcohol use. He reports that he does not use drugs.   Family History:  His family history includes Brain cancer in his sister; Diabetes in his mother; Leukemia in  his brother; Melanoma in his brother.   Allergies No Known Allergies   Home Medications  Prior to Admission medications   Medication Sig Start Date End Date Taking? Authorizing Provider  Azelastine HCl 137 MCG/SPRAY SOLN Place 1 spray into both nostrils at bedtime. 08/13/18   [provider]  B Complex-C (SUPER B COMPLEX PO) Take 1 tablet by mouth daily.    [provider]  Cholecalciferol (VITAMIN D3) 5000 units TABS Take 1 tablet by mouth daily.     [provider]  finasteride (PROSCAR) 5 MG tablet Take 5 mg by mouth daily. 10/17/19   [provider]  Magnesium 400 MG CAPS Take 400 mg by mouth daily.     [provider]  nitroGLYCERIN (NITROSTAT) 0.4 MG SL tablet Place 1 tablet (0.4 mg total) under the  tongue every 5 (five) minutes as needed for chest pain. 05/27/20 08/25/20  Tobb, Godfrey Pick, DO  Omega-3 Fatty Acids (FISH OIL) 1360 MG CAPS Take 2 capsules by mouth daily.    [provider]  rosuvastatin (CRESTOR) 20 MG tablet Take 1 tablet (20 mg total) by mouth daily. 08/03/20 11/01/20  Park Liter, MD  tamsulosin (FLOMAX) 0.4 MG CAPS capsule Take 2 capsules by mouth daily.  06/29/18   [provider]  valsartan (DIOVAN) 160 MG tablet Take 1 tablet (160 mg total) by mouth daily. 09/07/20   Park Liter, MD     Critical care time: Critical care time spent care of this patient is 49 minutes.

## 2020-10-04 NOTE — ED Triage Notes (Addendum)
Pt arrived via [facility] from home. Per EMS pt's wife told EMS pt c/o HA, chest pain then 10 mins later went unresponsive. CPR was performed on pt by wife then fire took over CPR. Pt had agonal respirations on scene and was in v-tach. Pt was shocked 4 times. EMS gave epi 1 mg, amiodarone 300 mg and ns 1 L. EMS gave versed 2.5 mg for intubation. EMS intubated pt with a 7.0 tube and 26 at the lips. Per EMS pt was down just a couple of mins. Pt is sinus w/ST elevation on monitor at present. VSS.

## 2020-10-04 NOTE — Progress Notes (Signed)
Patient transported to Cath Lab by RT.

## 2020-10-04 NOTE — Progress Notes (Signed)
ANTICOAGULATION CONSULT NOTE - Initial Consult  Pharmacy Consult for IV heparin Indication: STEMI  No Known Allergies  Patient Measurements: Height: 6\' 1"  (185.4 cm) Weight: 103 kg (227 lb 1.2 oz) IBW/kg (Calculated) : 79.9 Heparin Dosing Weight: 100 kg  Vital Signs: Temp: 97.4 F (36.3 C) (01/23 1830) Temp Source: Rectal (01/23 1756) BP: 140/96 (01/23 1830) Pulse Rate: 81 (01/23 1830)  Labs: Recent Labs    10/04/20 1802 10/04/20 1803 10/04/20 1808 10/04/20 1821  HGB 14.6 14.6 14.4 13.9  HCT 43.0 43.0 45.2 41.0  PLT  --   --  223  --   LABPROT  --   --  13.5  --   INR  --   --  1.1  --   CREATININE 1.80*  --   --   --     Estimated Creatinine Clearance: 44 mL/min (A) (by C-G formula based on SCr of 1.8 mg/dL (H)).   Medical History: Past Medical History:  Diagnosis Date  . Abnormal echocardiogram 06/07/2019  . Arrhythmia   . Ascending aortic aneurysm (HCC) 4.2 cm based on CT from December 2020 01/29/2018   4.3 cm by echo in 2019  . Benign brain tumor ([place])   . Benign neoplasm of brain ([place]) 12/19/2017  . Chest pain 12/04/2017  . Chronic kidney disease    Stage 3  . Chronic kidney disease, stage III (moderate) ([place]) 12/19/2017  . Dizziness 10/11/2019  . History of pulmonary embolus (PE)   . Hypertension   . Left bundle branch block 12/19/2017  . Nonsustained ventricular tachycardia ([place]) 10/11/2019  . Pain of left hip joint 05/29/2020  . Personal history of pulmonary embolism 12/19/2017  . Pulmonary hypertension due to thromboembolism ([place]) 03/09/2018   See echo 12/27/17 with nl PAS vs CTa chest 02/23/18   . Sinus bradycardia 12/22/2017  . Solitary pulmonary nodule on lung CT 03/08/2018   CT 12/04/17 1.0 x 0.8 x 0.7 cm nodular opacity in the RUL vs not seen 03/16/10 posterior segment of the right upper lobe. Marland KitchenSpirometry 03/08/2018    FEV1 3.86 (108%)  Ratio 96 s prior rx  - PET  03/13/18   Low grade c/w adenoca > rec T surgery eval      Medications:  Infusions:  . amiodarone  60 mg/hr (10/04/20 1809)   Followed by  . [START ON 10/05/2020] amiodarone    . heparin      Assessment: 75 yo male s/p VF arrest,  STEMI, pharmacy asked to begin anticoagulation with IV heparin.  Awaiting cath lab.  Goal of Therapy:  Heparin level 0.3-0.7 units/ml Monitor platelets by anticoagulation protocol: Yes   Plan:  Start IV heparin with bolus of 4000 units x 1, then start gtt at 1400 units/hr. Awaiting cath lab, will f/u plans for anticoagulation after cath.  [place] Crane, Roylene Reason, BCCP Clinical Pharmacist  10/04/2020 6:39 PM   [facility] pharmacy phone numbers are listed on [email or web address]

## 2020-10-04 NOTE — ED Notes (Signed)
Pgd Dr Saunders Revel [place]) to [place] @ [phone]

## 2020-10-04 NOTE — H&P (Signed)
Cardiology History & Physical    Patient ID: Timothy Moran MRN: [id number], DOB/AGE: 77/26/1947   Admit date: 10/04/2020  Primary Physician: Cari Caraway, MD Primary Cardiologist: Jenne Campus, MD  Patient Profile    Timothy Moran is a 75 year old male with a history of LBBB, mildly reduced LVEF, 4.2cm TAA, multiple symptomatic meningiomas (possible NF2 suspected), CKD stage III, two prior pulmonary embolisms (now off Eliquis), HTN, and pulmonary nodules, who presented as a resuscitated VF arrest at home and was found to have inferior ST elevation on ECG.  History of Present Illness    Timothy Moran wife reports that he was in his usual state of health today, and began having chest pain while shoveling snow this afternoon. The chest pain persisted, so she called EMS. While on the phone with the dispatcher, she states he collapsed and lost consciousness with no obvious injuries in the fall. No bystander CPR performed, though response time was reportedly 2-3 minutes. He was found with agonal breathing and an AED-shockable rhythm. He received a total of 4 defibrillations by fire and EMS with initial rhythm of VF on EMS arrival and ROSC at 16 minutes. Received epi 1mg  and amio 300mg . He has remained hemodynamically and electrically stable since. He was intubated by EMS with Versed 2mg . Noted to be gagging with OGT placement and withdrawing to any painful stimulus, but otherwise has not been responsive. His post-arrest ECG showed ST elevation in the inferior and apical leads, re-demonstrated on serial ECGs in the ED. Initial troponin was 75, Cr 1.8 from baseline of 1.4-1.5, pH 7.28, transaminases 100s. He has been started on an amiodarone infusion as well. We initially began cooling with ice packs, but removed once he started shivering and started fentanyl infusion.  Heparin bolus and rectal aspirin were given and he was taken emergently to the cath lab where he was found to have  thrombotic occlusion of a large, dominant, distal circumflex. This was treated with Onyx 2.5 x 19mm DES. There was initially no-reflow distally, but improved with IC adenosine and nitro, as well as Aggrastat. The procedure was otherwise uncomplicated with no further hemodynamic instability or VT/VF. He does also have residual obstructive disease of the mid-LAD.  Past Medical History   Past Medical History:  Diagnosis Date  . Abnormal echocardiogram 06/07/2019  . Arrhythmia   . Ascending aortic aneurysm (HCC) 4.2 cm based on CT from December 2020 01/29/2018   4.3 cm by echo in 2019  . Benign brain tumor ([place])   . Benign neoplasm of brain ([place]) 12/19/2017  . Chest pain 12/04/2017  . Chronic kidney disease    Stage 3  . Chronic kidney disease, stage III (moderate) ([place]) 12/19/2017  . Dizziness 10/11/2019  . History of pulmonary embolus (PE)   . Hypertension   . Left bundle branch block 12/19/2017  . Nonsustained ventricular tachycardia ([place]) 10/11/2019  . Pain of left hip joint 05/29/2020  . Personal history of pulmonary embolism 12/19/2017  . Pulmonary hypertension due to thromboembolism ([place]) 03/09/2018   See echo 12/27/17 with nl PAS vs CTa chest 02/23/18   . Sinus bradycardia 12/22/2017  . Solitary pulmonary nodule on lung CT 03/08/2018   CT 12/04/17 1.0 x 0.8 x 0.7 cm nodular opacity in the RUL vs not seen 03/16/10 posterior segment of the right upper lobe. Marland KitchenSpirometry 03/08/2018    FEV1 3.86 (108%)  Ratio 96 s prior rx  - PET  03/13/18   Low grade c/w adenoca > rec  T surgery eval      Past Surgical History:  Procedure Laterality Date  . APPENDECTOMY    . HERNIA REPAIR       Allergies No Known Allergies  Home Medications    Prior to Admission medications   Medication Sig Start Date End Date Taking? Authorizing Provider  Azelastine HCl 137 MCG/SPRAY SOLN Place 1 spray into both nostrils at bedtime. 08/13/18   [provider]  B Complex-C (SUPER B COMPLEX PO) Take 1 tablet by mouth daily.     [provider]  Cholecalciferol (VITAMIN D3) 5000 units TABS Take 1 tablet by mouth daily.     [provider]  finasteride (PROSCAR) 5 MG tablet Take 5 mg by mouth daily. 10/17/19   [provider]  Magnesium 400 MG CAPS Take 400 mg by mouth daily.     [provider]  nitroGLYCERIN (NITROSTAT) 0.4 MG SL tablet Place 1 tablet (0.4 mg total) under the tongue every 5 (five) minutes as needed for chest pain. 05/27/20 08/25/20  Tobb, Godfrey Pick, DO  Omega-3 Fatty Acids (FISH OIL) 1360 MG CAPS Take 2 capsules by mouth daily.    [provider]  rosuvastatin (CRESTOR) 20 MG tablet Take 1 tablet (20 mg total) by mouth daily. 08/03/20 11/01/20  Park Liter, MD  tamsulosin (FLOMAX) 0.4 MG CAPS capsule Take 2 capsules by mouth daily.  06/29/18   [provider]  valsartan (DIOVAN) 160 MG tablet Take 1 tablet (160 mg total) by mouth daily. 09/07/20   Park Liter, MD    Family History    Family History  Problem Relation Age of Onset  . Diabetes Mother   . Brain cancer Sister   . Melanoma Brother        Radiation Exposure  . Leukemia Brother        Agent Orange   He indicated that his mother is deceased. He indicated that his father is deceased. He indicated that only one of his two sisters is alive. He indicated that both of his brothers are deceased.   Social History    Social History   Socioeconomic History  . Marital status: Married    Spouse name: Not on file  . Number of children: Not on file  . Years of education: Not on file  . Highest education level: Not on file  Occupational History  . Not on file  Tobacco Use  . Smoking status: Former Smoker    Packs/day: 0.50    Years: 5.00    Pack years: 2.50    Types: Cigarettes    Quit date: 09/12/1968    Years since quitting: 52.0  . Smokeless tobacco: Never Used  Vaping Use  . Vaping Use: Never used  Substance and Sexual Activity  . Alcohol use: Yes    Comment: ONE  PER WEEK  . Drug use: Never  . Sexual activity: Not on file  Other Topics Concern  . Not on file  Social History Narrative  . Not on file   Social Determinants of Health   Financial Resource Strain: Not on file  Food Insecurity: Not on file  Transportation Needs: Not on file  Physical Activity: Not on file  Stress: Not on file  Social Connections: Not on file  Intimate Partner Violence: Not on file     Review of Systems    Unable to obtain due to patient condition.  Physical Exam    BP (!) 140/96   Pulse 81  Temp (!) 97.4 F (36.3 C)   Resp (!) 21   Ht 6\' 1"  (1.854 m)   Wt 103 kg   SpO2 100%   BMI 29.96 kg/m  General: Intubated, occasional spontaneous movements HEENT: Grossly normal, no evidence of traumatic injury  Neck: symmetric, trachea midline, unable to assess JVP Lungs:  Mostly synchronous with vent, patient triggered breaths with RR 22, symmetric chest rise, lungs CTA bilaterally. Heart: Regular rhythm, no appreciable murmurs or gallop. Abdomen: Soft, non-tender, non-distended, BS +.  Extremities: Warm. No clubbing, cyanosis or edema. Radial and femoral pulses 2+ and equal bilaterally. Neuro: Pupils 62mm, equal, sluggish. Intact gag. Localizes/withdraws to pain. RAAS -3 prior to initiation of fentanyl. Moves left arm more than right. No abnormal movements.  Labs    Troponin (Point of Care Test) No results for input(s): TROPIPOC in the last 72 hours. No results for input(s): CKTOTAL, CKMB, TROPONINI in the last 72 hours. Lab Results  Component Value Date   WBC 12.7 (H) 10/04/2020   HGB 13.9 10/04/2020   HCT 41.0 10/04/2020   MCV 95.0 10/04/2020   PLT 223 10/04/2020    Recent Labs  Lab 10/04/20 1808 10/04/20 1821  NA 139 139  K 4.3 4.2  CL 107  --   CO2 19*  --   BUN 20  --   CREATININE 1.86*  --   CALCIUM 8.6*  --   PROT 6.9  --   BILITOT 0.7  --   ALKPHOS 55  --   ALT 165*  --   AST 189*  --   GLUCOSE 217*  --    Lab Results   Component Value Date   CHOL 247 (H) 06/01/2020   HDL 33 (L) 06/01/2020   LDLCALC 151 (H) 06/01/2020   TRIG 338 (H) 06/01/2020   Lab Results  Component Value Date   DDIMER 7.81 (H) 06/19/2020     Radiology Studies    CT Head Wo Contrast  Result Date: 10/04/2020 CLINICAL DATA:  Initial evaluation for acute mental status change, history of cardiac arrest, STEMI. EXAM: CT HEAD WITHOUT CONTRAST TECHNIQUE: Contiguous axial images were obtained from the base of the skull through the vertex without intravenous contrast. COMPARISON:  Previous MRI from 07/26/2020. FINDINGS: Brain: Cerebral volume within normal limits for age. Underlying mild chronic small vessel ischemic disease. No acute intracranial hemorrhage. No acute large vessel territory infarct. Left parafalcine calcified meningioma seen at the posterior left frontoparietal region, measuring 2.7 x 2.1 cm (series 3, image 22). Finding is relatively unchanged from previous. No significant surrounding edema or mass effect. An additional on plaque meningioma overlying the anterior left frontal convexity measures approximately 3.7 x 1.1 cm, also not significantly changed. Scattered areas of internal calcification seen within this lesion as well. No significant localized edema or mass effect. No other mass lesion. No midline shift or hydrocephalus. No extra-axial fluid collection. Vascular: No hyperdense vessel. Skull: Scalp soft tissues within normal limits.  Calvarium intact. Sinuses/Orbits: Globes and orbital soft tissues within normal limits. Scattered mucosal thickening noted within the ethmoidal air cells. Endotracheal and enteric tubes partially visualized. No mastoid effusion. Other: None. IMPRESSION: 1. No acute intracranial abnormality. 2. Stable left parafalcine and anterior left frontal meningiomas as above. No significant localized edema or mass effect. 3. Underlying mild chronic small vessel ischemic disease. Electronically Signed   By:  Jeannine Boga M.D.   On: 10/04/2020 20:07   DG Chest Port 1 View  Result Date: 10/04/2020 CLINICAL DATA:  75 year old male with history of cardiac arrest status post CPR. EXAM: PORTABLE CHEST 1 VIEW COMPARISON:  Chest x-ray 06/25/2020. FINDINGS: An endotracheal tube is in place with tip 5.3 cm above the carina. Nasogastric tube extending into the proximal stomach. Defibrillator pad projecting over the lower left hemithorax. Lung volumes are low. No acute consolidative airspace disease. No pleural effusions. No pneumothorax. No evidence of pulmonary edema. Mild cardiomegaly. The patient is rotated to the right on today's exam, resulting in distortion of the mediastinal contours and reduced diagnostic sensitivity and specificity for mediastinal pathology. IMPRESSION: 1. Support apparatus, as above. 2. Mild cardiomegaly. Electronically Signed   By: Vinnie Langton M.D.   On: 10/04/2020 18:21   CT ANGIO CHEST AORTA W/CM & OR WO/CM  Result Date: 09/18/2020 CLINICAL DATA:  Thoracic aortic aneurysm.  Lung nodule. EXAM: CT ANGIOGRAPHY CHEST WITH CONTRAST TECHNIQUE: Multidetector CT imaging of the chest was performed using the standard protocol during bolus administration of intravenous contrast. Multiplanar CT image reconstructions and MIPs were obtained to evaluate the vascular anatomy. CONTRAST:  71mL ISOVUE-370 IOPAMIDOL (ISOVUE-370) INJECTION 76% COMPARISON:  August 21, 2019. FINDINGS: Cardiovascular: Grossly stable 4.2 cm ascending thoracic aortic aneurysm is noted. No dissection is noted. Great vessels are widely patent without significant stenosis. Stable cardiomegaly. No pericardial effusion. Mild coronary artery calcifications are noted. Mediastinum/Nodes: No enlarged mediastinal, hilar, or axillary lymph nodes. Thyroid gland, trachea, and esophagus demonstrate no significant findings. Lungs/Pleura: No pneumothorax or pleural effusion is noted. Left lung is clear. Grossly stable 12 x 6 mm nodule is  noted in right upper lobe best seen on image number 38 of series 8; this can be considered benign at this point as noted on prior exam. Stable 19 x 7 mm nodule is seen along right hemidiaphragm most consistent with subpleural lymph node. This is best seen on image number 101 of series 8 and can be considered benign at this point as noted on prior exam. Upper Abdomen: No acute abnormality. Musculoskeletal: No chest wall abnormality. No acute or significant osseous findings. Review of the MIP images confirms the above findings. IMPRESSION: 1. Grossly stable 4.2 cm ascending thoracic aortic aneurysm. No dissection is noted. Recommend annual imaging followup by CTA or MRA. This recommendation follows 2010 ACCF/AHA/AATS/ACR/ASA/SCA/SCAI/SIR/STS/SVM Guidelines for the Diagnosis and Management of Patients with Thoracic Aortic Disease. Circulation. 2010; [id number]. Aortic aneurysm NOS (ICD10-I71.9). 2. Mild coronary artery calcifications are noted suggesting coronary artery disease. 3. Grossly stable right lung nodules are noted, with the largest measuring 12 x 6 mm in the right upper lobe. These can be considered benign at this point as noted on prior exam. 4. Aortic atherosclerosis. Aortic Atherosclerosis (ICD10-I70.0). Electronically Signed   By: Marijo Conception M.D.   On: 09/18/2020 10:48    ECG & Cardiac Imaging    ECG: Sinus rhythm, borderline 1st degree AVB, LBBB, inferior and anteroapical ST elevation with reciprocal changes laterally (primarily aVL) - personally reviewed.  Limited TTE performed by myself showed severe inferior wall hypokinesis extending to the apex. No more than mild AI or MR. No evidence of aortic root dissection.  LHC, Coronary angiography, PCI: 1. Significant two-vessel coronary artery disease including thrombotic occlusion of the distal LCx, which is the culprit for the patient's inferior STEMI as well as sequential 50% and 80% proximal/mid LAD lesions. 2. Mildly elevated left  ventricular filling pressure (LVEDP 20-25 mmHg). 3. Successful PCI to distal LCx using Resolute Onyx 2.5 x 30 mm drug-eluting stent complicated by no reflow  treated with intracoronary adenosine and nitroglycerin, as well as aggressive antiplatelet therapy. Final angiogram shows 0% residual stenosis with TIMI-2 flow.  Assessment & Plan    Inferior STEMI: - s/p PCI of large, dominant distal circumflex culprit c/b no-reflow, successfully treated with Onyx 2.5 x 72mm DES, IC nitro/adenosine, and Aggrastat resulting in TIMI-2 flow.  - LVEDP elevated at 20-25, but not in cardiogenic shock and no significant pulmonary edema.  - Pending clinical course, will plan to stage mid-LAD (80% stenosis) - Obtain complete TTE and check lipid profile and A1c - Aggrastat for 6 hours - Continue aspirin and Brilinta for 1 year - Rosuvastatin 20mg  - Consider addition of beta blocker in the morning if able  VF cardiac arrest: - Due to STEMI, no recurrence since initial ROSC in the field - Continue amiodarone infusion, okay to decrease to 30mg /min at 6 hrs if no significant ventricular ectopy - Add lidocaine if needed - CCM managing TTM, agree with 36 degree target.  Acute hypoxic and hypercapnic respiratory failure: - due to cardiac arrest, breathing over vent, - last VBG 7.27 on PRVC 16/630 with PEEP 5, fiO2 weaned to 50% - CCM consulted for vent management, also managing sedation - would avoid propofol in setting of STEMI with reduced LV function  Acute encephalopathy, possible anoxic brain injury:  - witnessed arrest with estimated 2-3 minute no-flow time and approximately 15 minute low-flow.  - Brainstem reflexes appear intact and he localizes to any painful stimuli.  - CT negative for ICH, meningiomas appear stable - TTM as above, agree with 36 degree target. - EEG monitoring - C-collar in place pending CT neck.  AKI on CKD III:  - likely due to cardiac arrest - trend BMET and urine output - will  hold off on any RAAS blockade for now - avoid additional contrast for now  Nutrition: could consider initiation of tube feeds once rewarmed DVT ppx: Lovenox GI ppx: Protonix Advanced Care Planning: Full Code, confirmed with wife   Signed, Marykay Lex, MD 10/04/2020, 8:15 PM  '

## 2020-10-05 ENCOUNTER — Inpatient Hospital Stay (HOSPITAL_COMMUNITY): Payer: Medicare Other

## 2020-10-05 ENCOUNTER — Encounter (HOSPITAL_COMMUNITY): Payer: Self-pay | Admitting: Internal Medicine

## 2020-10-05 DIAGNOSIS — I469 Cardiac arrest, cause unspecified: Secondary | ICD-10-CM | POA: Diagnosis not present

## 2020-10-05 DIAGNOSIS — D62 Acute posthemorrhagic anemia: Secondary | ICD-10-CM | POA: Diagnosis present

## 2020-10-05 DIAGNOSIS — N1831 Chronic kidney disease, stage 3a: Secondary | ICD-10-CM

## 2020-10-05 DIAGNOSIS — R4020 Unspecified coma: Secondary | ICD-10-CM

## 2020-10-05 DIAGNOSIS — N179 Acute kidney failure, unspecified: Secondary | ICD-10-CM

## 2020-10-05 DIAGNOSIS — I4901 Ventricular fibrillation: Secondary | ICD-10-CM | POA: Diagnosis not present

## 2020-10-05 DIAGNOSIS — I447 Left bundle-branch block, unspecified: Secondary | ICD-10-CM

## 2020-10-05 DIAGNOSIS — Z86711 Personal history of pulmonary embolism: Secondary | ICD-10-CM

## 2020-10-05 DIAGNOSIS — I2121 ST elevation (STEMI) myocardial infarction involving left circumflex coronary artery: Secondary | ICD-10-CM | POA: Diagnosis not present

## 2020-10-05 HISTORY — DX: Acute kidney failure, unspecified: N17.9

## 2020-10-05 HISTORY — DX: Acute posthemorrhagic anemia: D62

## 2020-10-05 LAB — BASIC METABOLIC PANEL
Anion gap: 12 (ref 5–15)
Anion gap: 10 (ref 5–15)
BUN: 23 mg/dL (ref 8–23)
BUN: 25 mg/dL — ABNORMAL HIGH (ref 8–23)
CO2: 18 mmol/L — ABNORMAL LOW (ref 22–32)
CO2: 20 mmol/L — ABNORMAL LOW (ref 22–32)
Calcium: 7.8 mg/dL — ABNORMAL LOW (ref 8.9–10.3)
Calcium: 8 mg/dL — ABNORMAL LOW (ref 8.9–10.3)
Chloride: 105 mmol/L (ref 98–111)
Chloride: 107 mmol/L (ref 98–111)
Creatinine, Ser: 1.74 mg/dL — ABNORMAL HIGH (ref 0.61–1.24)
Creatinine, Ser: 1.66 mg/dL — ABNORMAL HIGH (ref 0.61–1.24)
GFR, Estimated: 40 mL/min — ABNORMAL LOW (ref >60)
GFR, Estimated: 42 mL/min — ABNORMAL LOW (ref >60)
Glucose, Bld: 218 mg/dL — ABNORMAL HIGH (ref 70–99)
Glucose, Bld: 261 mg/dL — ABNORMAL HIGH (ref 70–99)
Potassium: 4.9 mmol/L (ref 3.5–5.1)
Potassium: 5.1 mmol/L (ref 3.5–5.1)
Sodium: 135 mmol/L (ref 135–145)
Sodium: 137 mmol/L (ref 135–145)

## 2020-10-05 LAB — CBC
HCT: 38.5 % — ABNORMAL LOW (ref 39.0–52.0)
HCT: 40 % (ref 39.0–52.0)
Hemoglobin: 12.6 g/dL — ABNORMAL LOW (ref 13.0–17.0)
Hemoglobin: 13.3 g/dL (ref 13.0–17.0)
MCH: 30.4 pg (ref 26.0–34.0)
MCH: 30.9 pg (ref 26.0–34.0)
MCHC: 32.7 g/dL (ref 30.0–36.0)
MCHC: 33.3 g/dL (ref 30.0–36.0)
MCV: 92.8 fL (ref 80.0–100.0)
MCV: 93 fL (ref 80.0–100.0)
Platelets: 241 10*3/uL (ref 150–400)
Platelets: 243 10*3/uL (ref 150–400)
RBC: 4.14 MIL/uL — ABNORMAL LOW (ref 4.22–5.81)
RBC: 4.31 MIL/uL (ref 4.22–5.81)
RDW: 13.4 % (ref 11.5–15.5)
RDW: 13.5 % (ref 11.5–15.5)
WBC: 14.2 10*3/uL — ABNORMAL HIGH (ref 4.0–10.5)
WBC: 20.5 10*3/uL — ABNORMAL HIGH (ref 4.0–10.5)
nRBC: 0 % (ref 0.0–0.2)
nRBC: 0 % (ref 0.0–0.2)

## 2020-10-05 LAB — POCT I-STAT EG7
Acid-base deficit: 6 mmol/L — ABNORMAL HIGH (ref 0.0–2.0)
Bicarbonate: 21.9 mmol/L (ref 20.0–28.0)
Calcium, Ion: 1.17 mmol/L (ref 1.15–1.40)
HCT: 41 % (ref 39.0–52.0)
Hemoglobin: 13.9 g/dL (ref 13.0–17.0)
O2 Saturation: 66 %
Patient temperature: 36
Potassium: 5.1 mmol/L (ref 3.5–5.1)
Sodium: 142 mmol/L (ref 135–145)
TCO2: 23 mmol/L (ref 22–32)
pCO2, Ven: 47.6 mmHg (ref 44.0–60.0)
pH, Ven: 7.266 (ref 7.250–7.430)
pO2, Ven: 37 mmHg (ref 32.0–45.0)

## 2020-10-05 LAB — APTT
aPTT: >200 seconds (ref 24–36)
aPTT: 40 seconds — ABNORMAL HIGH (ref 24–36)

## 2020-10-05 LAB — PROTIME-INR
INR: 1.2 (ref 0.8–1.2)
INR: 1.3 — ABNORMAL HIGH (ref 0.8–1.2)
Prothrombin Time: 15.2 seconds (ref 11.4–15.2)
Prothrombin Time: 15.7 seconds — ABNORMAL HIGH (ref 11.4–15.2)

## 2020-10-05 LAB — GLUCOSE, CAPILLARY
Glucose-Capillary: 240 mg/dL — ABNORMAL HIGH (ref 70–99)
Glucose-Capillary: 235 mg/dL — ABNORMAL HIGH (ref 70–99)
Glucose-Capillary: 220 mg/dL — ABNORMAL HIGH (ref 70–99)
Glucose-Capillary: 173 mg/dL — ABNORMAL HIGH (ref 70–99)
Glucose-Capillary: 121 mg/dL — ABNORMAL HIGH (ref 70–99)
Glucose-Capillary: 148 mg/dL — ABNORMAL HIGH (ref 70–99)

## 2020-10-05 LAB — TROPONIN I (HIGH SENSITIVITY)
Troponin I (High Sensitivity): >27000 ng/L (ref <18)
Troponin I (High Sensitivity): >27000 ng/L (ref <18)

## 2020-10-05 LAB — ECHOCARDIOGRAM COMPLETE
Area-P 1/2: 2.87 cm2
Height: 73 in
S' Lateral: 4.4 cm
Weight: 3679.04 oz

## 2020-10-05 LAB — POCT ACTIVATED CLOTTING TIME
Activated Clotting Time: 220 seconds
Activated Clotting Time: 249 seconds
Activated Clotting Time: 434 seconds

## 2020-10-05 LAB — MAGNESIUM: Magnesium: 1.8 mg/dL (ref 1.7–2.4)

## 2020-10-05 LAB — PHOSPHORUS: Phosphorus: 5.5 mg/dL — ABNORMAL HIGH (ref 2.5–4.6)

## 2020-10-05 MED ORDER — ROSUVASTATIN CALCIUM 20 MG PO TABS
20.0000 mg | ORAL_TABLET | Freq: Every day | ORAL | Status: DC
  Administered 2020-10-05: 20 mg
  Filled 2020-10-05: qty 1

## 2020-10-05 MED ORDER — BUSPIRONE HCL 10 MG PO TABS
15.0000 mg | ORAL_TABLET | Freq: Three times a day (TID) | ORAL | Status: DC
  Administered 2020-10-05 (×3): 15 mg
  Filled 2020-10-05 (×3): qty 2

## 2020-10-05 MED ORDER — ACETAMINOPHEN 160 MG/5ML PO SOLN: 650.0000 mg | Freq: Four times a day (QID) | ORAL | Status: DC | PRN

## 2020-10-05 MED ORDER — ACETAMINOPHEN 160 MG/5ML PO SOLN
650.0000 mg | Freq: Four times a day (QID) | ORAL | Status: DC | PRN
  Administered 2020-10-05: 650 mg via ORAL
  Filled 2020-10-05: qty 20.3

## 2020-10-05 MED ORDER — DEXMEDETOMIDINE HCL IN NACL 400 MCG/100ML IV SOLN
0.4000 ug/kg/h | INTRAVENOUS | Status: DC
  Administered 2020-10-05: 0.4 ug/kg/h via INTRAVENOUS
  Administered 2020-10-06: 0.2 ug/kg/h via INTRAVENOUS
  Filled 2020-10-05 (×2): qty 100

## 2020-10-05 MED ORDER — ORAL CARE MOUTH RINSE
15.0000 mL | OROMUCOSAL | Status: DC
  Administered 2020-10-05 – 2020-10-06 (×9): 15 mL via OROMUCOSAL

## 2020-10-05 MED ORDER — INSULIN ASPART 100 UNIT/ML ~~LOC~~ SOLN
2.0000 [IU] | SUBCUTANEOUS | Status: DC
  Administered 2020-10-05: 4 [IU] via SUBCUTANEOUS
  Administered 2020-10-05 (×2): 2 [IU] via SUBCUTANEOUS
  Administered 2020-10-05: 6 [IU] via SUBCUTANEOUS
  Administered 2020-10-06: 4 [IU] via SUBCUTANEOUS
  Administered 2020-10-06: 3 [IU] via SUBCUTANEOUS

## 2020-10-05 MED ORDER — VITAL 1.5 CAL PO LIQD
1000.0000 mL | ORAL | Status: DC
  Administered 2020-10-05 – 2020-10-06 (×3): 1000 mL

## 2020-10-05 MED ORDER — CHLORHEXIDINE GLUCONATE 0.12% ORAL RINSE (MEDLINE KIT)
15.0000 mL | Freq: Two times a day (BID) | OROMUCOSAL | Status: DC
  Administered 2020-10-05 – 2020-10-07 (×4): 15 mL via OROMUCOSAL

## 2020-10-05 MED ORDER — MIDAZOLAM 50MG/50ML (1MG/ML) PREMIX INFUSION
0.5000 mg/h | INTRAVENOUS | Status: DC
  Administered 2020-10-05: 0.5 mg/h via INTRAVENOUS
  Filled 2020-10-05: qty 50

## 2020-10-05 MED ORDER — TICAGRELOR 90 MG PO TABS
90.0000 mg | ORAL_TABLET | Freq: Two times a day (BID) | ORAL | Status: DC
  Administered 2020-10-05 (×2): 90 mg
  Filled 2020-10-05 (×2): qty 1

## 2020-10-05 MED ORDER — MAGNESIUM SULFATE IN D5W 1-5 GM/100ML-% IV SOLN
1.0000 g | Freq: Once | INTRAVENOUS | Status: AC
  Administered 2020-10-05: 1 g via INTRAVENOUS
  Filled 2020-10-05: qty 100

## 2020-10-05 MED ORDER — ASPIRIN 81 MG PO CHEW
81.0000 mg | CHEWABLE_TABLET | Freq: Every day | ORAL | Status: DC
  Administered 2020-10-05: 81 mg
  Filled 2020-10-05: qty 1

## 2020-10-05 MED ORDER — PROSOURCE TF PO LIQD
90.0000 mL | Freq: Three times a day (TID) | ORAL | Status: DC
  Administered 2020-10-05 (×2): 90 mL
  Filled 2020-10-05 (×2): qty 90

## 2020-10-05 NOTE — Progress Notes (Signed)
Progress Note  Patient Name: Timothy Moran Date of Encounter: 10/05/2020  [facility] Cardiologist: Jenne Campus, MD  Subjective   Remains intubated and sedated. Does respond to noxious stimuli. Oozing at site of central line, dressed with pressure dressing.  Inpatient Medications    Scheduled Meds: . aspirin  81 mg Per Tube Daily  . busPIRone  15 mg Per Tube TID  . chlorhexidine gluconate (MEDLINE KIT)  15 mL Mouth Rinse BID  . Chlorhexidine Gluconate Cloth  6 each Topical Daily  . enoxaparin (LOVENOX) injection  40 mg Subcutaneous Q24H  . fentaNYL (SUBLIMAZE) injection  25 mcg Intravenous Once  . insulin aspart  2-6 Units Subcutaneous Q4H  . mouth rinse  15 mL Mouth Rinse 10 times per day  . pantoprazole (PROTONIX) IV  40 mg Intravenous QHS  . rosuvastatin  20 mg Per Tube Daily  . sodium chloride flush  3 mL Intravenous Q12H  . Thrombi-Pad  1 each Topical Once  . ticagrelor  90 mg Per Tube BID   Continuous Infusions: . sodium chloride    . amiodarone 30 mg/hr (10/05/20 0800)  . fentaNYL infusion INTRAVENOUS 200 mcg/hr (10/05/20 0800)  . midazolam 1 mg/hr (10/05/20 0800)  . norepinephrine (LEVOPHED) Adult infusion    . sodium chloride     PRN Meds: sodium chloride, acetaminophen (TYLENOL) oral liquid 160 mg/5 mL, fentaNYL, midazolam, ondansetron (ZOFRAN) IV, sodium chloride flush   Vital Signs    Vitals:   10/05/20 0800 10/05/20 0815 10/05/20 0824 10/05/20 1000  BP: 121/73 121/73    Pulse: (!) 58 (!) 54    Resp: 16 14    Temp:   (!) 96.6 F (35.9 C) (!) 97.3 F (36.3 C)  TempSrc:   Bladder   SpO2: (!) 84% 98%    Weight:      Height:        Intake/Output Summary (Last 24 hours) at 10/05/2020 1037 Last data filed at 10/05/2020 0800 Gross per 24 hour  Intake 1174.88 ml  Output 1100 ml  Net 74.88 ml   Last 3 Weights 10/04/2020 10/04/2020 09/22/2020  Weight (lbs) 229 lb 15 oz 227 lb 1.2 oz 227 lb  Weight (kg) 104.3 kg 103 kg 102.967 kg       Telemetry    NSR- Personally Reviewed  ECG    1/24 SR, 1st degree AV bock, LBBB, ST elevations resolved- Personally Reviewed  Physical Exam   GEN: intubated and sedated Neck: neck collar in place Cardiac: RRR, no murmurs, rubs, or gallops. Left upper chest site pressure dressed with evidence of oozing, margins marked Respiratory: Clear to auscultation bilaterally. Ventilated breath sounds GI: Soft, cooling pads in place MS: No edema; No deformity. Neuro:  responds to noxious stimuli Psych: sedated  Labs    High Sensitivity Troponin:   Recent Labs  Lab 10/04/20 1808 10/04/20 2324 10/05/20 0500  TROPONINIHS 75* >27,000* >27,000*      Chemistry Recent Labs  Lab 10/04/20 1808 10/04/20 1821 10/04/20 2324 10/04/20 2327 10/05/20 0500  NA 139   < > 135 142 137  K 4.3   < > 4.9 5.1 5.1  CL 107  --  105  --  107  CO2 19*  --  18*  --  20*  GLUCOSE 217*  --  218*  --  261*  BUN 20  --  23  --  25*  CREATININE 1.86*  --  1.74*  --  1.66*  CALCIUM 8.6*  --  7.8*  --  8.0*  PROT 6.9  --   --   --   --   ALBUMIN 3.6  --   --   --   --   AST 189*  --   --   --   --   ALT 165*  --   --   --   --   ALKPHOS 55  --   --   --   --   BILITOT 0.7  --   --   --   --   GFRNONAA 37*  --  40*  --  42*  ANIONGAP 13  --  12  --  10   < > = values in this interval not displayed.     Hematology Recent Labs  Lab 10/04/20 1808 10/04/20 1821 10/04/20 2327 10/04/20 2359 10/05/20 0500  WBC 12.7*  --   --  20.5* 14.2*  RBC 4.76  --   --  4.31 4.14*  HGB 14.4   < > 13.9 13.3 12.6*  HCT 45.2   < > 41.0 40.0 38.5*  MCV 95.0  --   --  92.8 93.0  MCH 30.3  --   --  30.9 30.4  MCHC 31.9  --   --  33.3 32.7  RDW 13.2  --   --  13.4 13.5  PLT 223  --   --  243 241   < > = values in this interval not displayed.    BNP Recent Labs  Lab 10/04/20 1809  BNP 51.6     DDimer No results for input(s): DDIMER in the last 168 hours.   Radiology    DG Chest 1 View  Result Date:  10/04/2020 CLINICAL DATA:  Central line placement EXAM: CHEST  1 VIEW COMPARISON:  10/04/2020 FINDINGS: Single frontal view of the chest demonstrates stable endotracheal tube and enteric catheter. Left subclavian central venous catheter tip overlies superior vena cava. The cardiac silhouette is enlarged but stable. Persistent central vascular congestion without consolidation, effusion, or pneumothorax. IMPRESSION: 1. No complication after left subclavian catheter placement. 2. Stable central vascular congestion. Electronically Signed   By: Randa Ngo M.D.   On: 10/04/2020 22:54   DG Abd 1 View  Result Date: 10/04/2020 CLINICAL DATA:  Enteric catheter placement, history of cardiac arrest EXAM: ABDOMEN - 1 VIEW COMPARISON:  None. FINDINGS: Supine frontal view of the upper abdomen was obtained, excluding the lower pelvis and right flank by collimation. Enteric catheter passes below diaphragm tip and side port projecting over the gastric fundus. External defibrillator pads overlie the lower chest. Excreted contrast within the kidneys. Paucity of bowel gas. IMPRESSION: 1. Enteric catheter overlying gastric fundus. Electronically Signed   By: Randa Ngo M.D.   On: 10/04/2020 22:15   CT Head Wo Contrast  Result Date: 10/04/2020 CLINICAL DATA:  Initial evaluation for acute mental status change, history of cardiac arrest, STEMI. EXAM: CT HEAD WITHOUT CONTRAST TECHNIQUE: Contiguous axial images were obtained from the base of the skull through the vertex without intravenous contrast. COMPARISON:  Previous MRI from 07/26/2020. FINDINGS: Brain: Cerebral volume within normal limits for age. Underlying mild chronic small vessel ischemic disease. No acute intracranial hemorrhage. No acute large vessel territory infarct. Left parafalcine calcified meningioma seen at the posterior left frontoparietal region, measuring 2.7 x 2.1 cm (series 3, image 22). Finding is relatively unchanged from previous. No significant  surrounding edema or mass effect. An additional on plaque meningioma overlying the anterior  left frontal convexity measures approximately 3.7 x 1.1 cm, also not significantly changed. Scattered areas of internal calcification seen within this lesion as well. No significant localized edema or mass effect. No other mass lesion. No midline shift or hydrocephalus. No extra-axial fluid collection. Vascular: No hyperdense vessel. Skull: Scalp soft tissues within normal limits.  Calvarium intact. Sinuses/Orbits: Globes and orbital soft tissues within normal limits. Scattered mucosal thickening noted within the ethmoidal air cells. Endotracheal and enteric tubes partially visualized. No mastoid effusion. Other: None. IMPRESSION: 1. No acute intracranial abnormality. 2. Stable left parafalcine and anterior left frontal meningiomas as above. No significant localized edema or mass effect. 3. Underlying mild chronic small vessel ischemic disease. Electronically Signed   By: Jeannine Boga M.D.   On: 10/04/2020 20:07   CARDIAC CATHETERIZATION  Result Date: 10/04/2020 Conclusions: 1. Significant two-vessel coronary artery disease including thrombotic occlusion of the distal LCx, which is the culprit for the patient's inferior STEMI as well as sequential 50% and 80% proximal/mid LAD lesions. 2. Mildly elevated left ventricular filling pressure (LVEDP 20-25 mmHg). 3. Successful PCI to distal LCx using Resolute Onyx 2.5 x 30 mm drug-eluting stent complicated by no reflow treated with intracoronary adenosine and nitroglycerin, as well as aggressive antiplatelet therapy. Final angiogram shows 0% residual stenosis with TIMI-2 flow. Recommendations: 1. Admit to 2H-ICU for post STEMI/cardiac arrest monitoring. Consider targeted temperature management; critical care medicine has been consulted to assist with targeted temperature and ventilator management. 2. Continue tirofiban infusion for 6 hours. 3. Loaded with ticagrelor when  patient arrives in ICU; patient should complete 12 months of dual antiplatelet therapy with aspirin and ticagrelor. 4. Obtain echocardiogram. 5. Aggressive secondary prevention of coronary artery disease including high intensity statin therapy. 6. Continue IV amiodarone overnight. 7. Consider staged PCI to LAD. Nelva Bush, MD [facility]   DG Chest Port 1 View  Result Date: 10/04/2020 CLINICAL DATA:  Intubated, cardiac arrest, myocardial infarction EXAM: PORTABLE CHEST 1 VIEW COMPARISON:  10/04/2020 at 6:05 p.m. FINDINGS: Single frontal view of the chest demonstrates endotracheal tube overlying tracheal air column tip at level of thoracic inlet. Enteric catheter tip and side port project over gastric fundus. Cardiac silhouette is enlarged but stable. Central vascular congestion without airspace disease, effusion, or pneumothorax. No acute bony abnormalities. IMPRESSION: 1. Central vascular congestion without overt edema. 2. Support devices as above. Electronically Signed   By: Randa Ngo M.D.   On: 10/04/2020 22:16   DG Chest Port 1 View  Result Date: 10/04/2020 CLINICAL DATA:  75 year old male with history of cardiac arrest status post CPR. EXAM: PORTABLE CHEST 1 VIEW COMPARISON:  Chest x-ray 06/25/2020. FINDINGS: An endotracheal tube is in place with tip 5.3 cm above the carina. Nasogastric tube extending into the proximal stomach. Defibrillator pad projecting over the lower left hemithorax. Lung volumes are low. No acute consolidative airspace disease. No pleural effusions. No pneumothorax. No evidence of pulmonary edema. Mild cardiomegaly. The patient is rotated to the right on today's exam, resulting in distortion of the mediastinal contours and reduced diagnostic sensitivity and specificity for mediastinal pathology. IMPRESSION: 1. Support apparatus, as above. 2. Mild cardiomegaly. Electronically Signed   By: Vinnie Langton M.D.   On: 10/04/2020 18:21    Cardiac Studies   Cath  10/04/20 Conclusions: 1. Significant two-vessel coronary artery disease including thrombotic occlusion of the distal LCx, which is the culprit for the patient's inferior STEMI as well as sequential 50% and 80% proximal/mid LAD lesions. 2. Mildly elevated left  ventricular filling pressure (LVEDP 20-25 mmHg). 3. Successful PCI to distal LCx using Resolute Onyx 2.5 x 30 mm drug-eluting stent complicated by no reflow treated with intracoronary adenosine and nitroglycerin, as well as aggressive antiplatelet therapy. Final angiogram shows 0% residual stenosis with TIMI-2 flow.  Recommendations: 1. Admit to 2H-ICU for post STEMI/cardiac arrest monitoring. Consider targeted temperature management; critical care medicine has been consulted to assist with targeted temperature and ventilator management. 2. Continue tirofiban infusion for 6 hours. 3. Loaded with ticagrelor when patient arrives in ICU; patient should complete 12 months of dual antiplatelet therapy with aspirin and ticagrelor. 4. Obtain echocardiogram. 5. Aggressive secondary prevention of coronary artery disease including high intensity statin therapy. 6. Continue IV amiodarone overnight. 7. Consider staged PCI to LAD.  Echocardiogram pending  Patient Profile     75 y.o. male with PMH LBBB, cardiomyopathy (EF 40-45%), TAA, multiple meningiomas, stage 3 chronic kidney disease, prior PE (x2, reported as provoked), hypertension who presented 10/04/20 as a VF cardiac arrest with inferior STEMI  Assessment & Plan    Inferior/inferolateral STEMI, complicated by cardiac arrest/VF as initial rhythm -s/p urgent PCI of distal LCx. Initial no-reflow, but improved with nitro, adenosine, and aggrastat -echo pending this AM -continue aspirin, ticagrelor -once extubated/clinically stable, consider staged PCI to LAD -he is bradycardic, will hold on beta blocker at this time -peak hsTn 27,000. Initial was 75 -on rosuvastatin currently. Initial LFTs  elevated slightly, monitor closely -lipids, A1c added for tomorrow AM -on targeted temperature management due to lack of purposeful movement. EEG underway this AM. Appreciate PCCM assistance with TTM and ventilator management  Acute kidney injury on chronic kidney disease stage 3: -presented Cr 1.86, down to 1.66 today. Prior was 1.38  Acute blood loss anemia -at site of central line -Hgb 13.9 -> 12.6 -will try to manage conservatively. Liked exacerbated by aggrastat. Now that this is off, will monitor closely. -will redress, thrombi pad, pressure dressing. -Reschedule lovenox for this afternoon if oozing has slowed.  History of prior pulmonary embolisms -pre Dr. Wendy Poet note 08/03/20, plan for 3 mons of anticoagulation with apixaban. However, I do not see this on his medicine list. Will attempt to get clarification from his wife.  LBBB -chronic  Cardiomyopathy, EF 40-45% prior to cardiac arrest -was on valsartan as an outpatient  Hypertension: -at goal currently. Will monitor. Will add back medications as renal function and blood pressure allow  CRITICAL CARE Patient is critically ill with multiple organ systems affected and requires high complexity decision making. Total critical care time: 45 minutes. This time includes gathering of history, evaluation of patient's response to treatment, examination of patient, review of laboratory and imaging studies, and coordination with consultants. Greater than 50% of time spent in direct patient care.  For questions or updates, please contact [place] Please consult [email or web address] for contact info under     Signed, Buford Dresser, MD  10/05/2020, 10:37 AM

## 2020-10-05 NOTE — Plan of Care (Signed)
  Problem: Education: Goal: Knowledge of General Education information will improve Description: Including pain rating scale, medication(s)/side effects and non-pharmacologic comfort measures Outcome: Progressing   Problem: Clinical Measurements: Goal: Ability to maintain clinical measurements within normal limits will improve Outcome: Progressing Goal: Will remain free from infection Outcome: Progressing Goal: Diagnostic test results will improve Outcome: Progressing Goal: Respiratory complications will improve Outcome: Progressing   Problem: Education: Goal: Understanding of CV disease, CV risk reduction, and recovery process will improve Outcome: Progressing Goal: Individualized Educational Video(s) Outcome: Progressing   Problem: Activity: Goal: Ability to return to baseline activity level will improve Outcome: Progressing   Problem: Cardiovascular: Goal: Ability to achieve and maintain adequate cardiovascular perfusion will improve Outcome: Progressing Goal: Vascular access site(s) Level 0-1 will be maintained Outcome: Progressing

## 2020-10-05 NOTE — Progress Notes (Signed)
Initial Nutrition Assessment  RD working remotely.  DOCUMENTATION CODES:   Not applicable  INTERVENTION:   Tube feeding via OG tube: - Vital 1.5 @ 50 ml/hr (1200 ml/day) - ProSource TF 90 ml TID  Tube feeding regimen provides 2040 kcal, 147 grams of protein, and 917 ml of H2O.   NUTRITION DIAGNOSIS:   Inadequate oral intake related to inability to eat as evidenced by NPO status.  GOAL:   Patient will meet greater than or equal to 90% of their needs  MONITOR:   Vent status,Labs,TF tolerance,Weight trends  REASON FOR ASSESSMENT:   Ventilator,Consult Enteral/tube feeding initiation and management  ASSESSMENT:   75 year old male who presented to the ED on 1/23 after cardiac arrest. PMH of LBBB, multiple symptomatic meningiomas, CKD stage III, 2 prior pulmonary embolisms, HTN, pulmonary nodules. Pt found to have post-cardiac arrest STEMI s/p urgent PCI of distal LCx. TTM 36 degrees initiated.   Received consult for tube feeding initiation and management. OG tube in place per x-ray, currently clamped.  Patient is currently intubated on ventilator support MV: 12.8 L/min Temp (24hrs), Avg:96.9 F (36.1 C), Min:96.1 F (35.6 C), Max:97.5 F (36.4 C) BP (cuff): 154/68 MAP (cuff): 91  Drips: Fentanyl Versed Amiodarone  Medications reviewed and include: SSI q 4 hours, IV protonix  Labs reviewed: BUN 25, creatinine 1.66 CBG's: 179-240  UOP: 1000 ml x 24 hours  NUTRITION - FOCUSED PHYSICAL EXAM:  Unable to complete at this time. RD working remotely.  Diet Order:   Diet Order            Diet NPO time specified  Diet effective now                 EDUCATION NEEDS:   No education needs have been identified at this time  Skin:  Skin Assessment: Reviewed RN Assessment  Last BM:  no documented BM  Height:   Ht Readings from Last 1 Encounters:  10/04/20 6\' 1"  (1.854 m)    Weight:   Wt Readings from Last 1 Encounters:  10/04/20 104.3 kg    BMI:   Body mass index is 30.34 kg/m.  Estimated Nutritional Needs:   Kcal:  2110  Protein:  140-160 grams  Fluid:  >/= 2.0 L    Gustavus Bryant, MS, RD, LDN Inpatient Clinical Dietitian Please see AMiON for contact information.

## 2020-10-05 NOTE — Progress Notes (Signed)
[facility] Progress Note Patient Name: Timothy Moran DOB: Sep 16, 1945 MRN: [id number]   Date of Service  10/05/2020  HPI/Events of Note  Hypomagnesemia - Mg++ = 1.8 and Creatinine = 1.66.   eICU Interventions  Will replace Mg++.     Intervention Category Major Interventions: Electrolyte abnormality - evaluation and management  Lysle Dingwall 10/05/2020, 7:33 PM

## 2020-10-05 NOTE — Procedures (Addendum)
Patient Name: Taivon Toomes  MRN: [id number]  Epilepsy Attending: Charlsie Quest  Referring Physician/Provider: Dr Gillermina Phy Date: 10/05/2020 Duration: 25.56 minutes  Patient history: 75 year old man status post cardiac arrest.  EEG to evaluate for seizures.  Level of alertness: Comatose  AEDs during EEG study: Versed  Technical aspects: This EEG study was done with scalp electrodes positioned according to the 10-20 International system of electrode placement. Electrical activity was acquired at a sampling rate of 500Hz  and reviewed with a high frequency filter of 70Hz  and a low frequency filter of 1Hz . EEG data were recorded continuously and digitally stored.   Description: EEG showed continuous generalized 3 to 5 Hz theta- delta slowing. EEG was reactive to noxious stimulation.  Hyperventilation and photic stimulation were not performed.     ABNORMALITY -Continuous slow, generalized  IMPRESSION: This study is suggestive of severe diffuse encephalopathy, nonspecific etiology.  No seizures or epileptiform discharges were seen throughout the recording.  Makoto Sellitto Annabelle Harman

## 2020-10-05 NOTE — Progress Notes (Signed)
EEG complete - results pending LTM to follow pt TTM

## 2020-10-05 NOTE — Progress Notes (Signed)
NAME:  Timothy Moran, MRN:  [id number], DOB:  July 07, 1946, LOS: 1 ADMISSION DATE:  10/04/2020, CONSULTATION DATE: October 05, 2019 REFERRING MD: Cardiology, CHIEF COMPLAINT: Cardiac arrest  Brief History:  75 year old male status post V. Fib cardiac arrest witnessed by wife, shocked 4 and 2 epi.  Patient was noted to have acute inferior/inferolateral STEMI, underwent cardiac cath with stent and circumflex came up unresponsive nonpurposeful movements initiated hypothermia protocol.  Past Medical History:  History of meningioma, spinal tumor, benign lung nodules PET positive, chronic kidney disease stage IV, hypertension, hyperlipidemia, ascending aortic aneurysm and left bundle branch block  Significant Hospital Events:  1/23 cardiac cath with cardiac stent 1/24 hypothermia protocol  Consults:  Cardiology  Procedures:  1/23 cardiac cath with a stent in left circumflex 1/23 ETT 1/23 right subclavian CVC  Objective   Blood pressure (!) 169/69, pulse 64, temperature (!) 97 F (36.1 C), resp. rate 16, height 6\' 1"  (1.854 m), weight 104.3 kg, SpO2 98 %.    Vent Mode: PRVC FiO2 (%):  [40 %-100 %] 40 % Set Rate:  [14 bmp-16 bmp] 16 bmp Vt Set:  [630 mL-640 mL] 640 mL PEEP:  [5 cmH20] 5 cmH20 Plateau Pressure:  [16 cmH20-21 cmH20] 20 cmH20   Intake/Output Summary (Last 24 hours) at 10/05/2020 1353 Last data filed at 10/05/2020 1300 Gross per 24 hour  Intake 1365.55 ml  Output 1175 ml  Net 190.55 ml   Filed Weights   10/04/20 1758 10/04/20 2115  Weight: 103 kg 104.3 kg    Examination: General:  Elderly Caucasian male, lying on the bed, orally intubated HEENT:  Atraumatic, normocephalic, ETT and OGT in place        Neck: Neck collar no evidence of trauma Lungs:   Bilateral faint crackles, no wheezes or rhonchi. Heart: Regular rhythm, no s3, s4, or murmurs. Abdomen: Soft, non-tender, non-distended, BS +.  Extremities: Warm. No clubbing, cyanosis or edema. DP/PT/Radials 2+  and equal bilaterally. Neuro: Intubated, sedated, not following commands. Skin: No rash   Assessment & Plan:  Acute inferior/inferior lateral ST elevation MI s/p DES Status post V. fib cardiac arrest on hypothermia protocol Acute hypoxic/hypercapnic respiratory failure Acute kidney injury on CKD stage IIIa Acute blood loss anemia Chronic left bundle branch block Chronic systolic congestive heart failure Hypertension Acute hypoxic/toxic encephalopathy  Continue aspirin, Brilinta and atorvastatin Echocardiogram this morning Continue TTM with targeted temperature to 36 degrees for 24 hours EEG stat Continue amiodarone Vent setting was adjusted to clear hypercapnia Monitor serum creatinine Patient is still oozing from central line site, monitor H&H Apply thrombin pad Continue sedation with RASS goal -1/-2 Monitor intake and output CT scan is negative for acute finding, consistent with chronic meningioma   Labs   CBC: Recent Labs  Lab 10/04/20 1808 10/04/20 1821 10/04/20 2327 10/04/20 2359 10/05/20 0500  WBC 12.7*  --   --  20.5* 14.2*  NEUTROABS 8.0*  --   --   --   --   HGB 14.4 13.9 13.9 13.3 12.6*  HCT 45.2 41.0 41.0 40.0 38.5*  MCV 95.0  --   --  92.8 93.0  PLT 223  --   --  243 [id number]    Basic Metabolic Panel: Recent Labs  Lab 10/04/20 1802 10/04/20 1803 10/04/20 1808 10/04/20 1821 10/04/20 2324 10/04/20 2327 10/05/20 0500  NA 141   < > 139 139 135 142 137  K 4.3   < > 4.3 4.2 4.9 5.1 5.1  CL 109  --  107  --  105  --  107  CO2  --   --  19*  --  18*  --  20*  GLUCOSE 209*  --  217*  --  218*  --  261*  BUN 26*  --  20  --  23  --  25*  CREATININE 1.80*  --  1.86*  --  1.74*  --  1.66*  CALCIUM  --   --  8.6*  --  7.8*  --  8.0*   < > = values in this interval not displayed.   GFR: Estimated Creatinine Clearance: 48 mL/min (A) (by C-G formula based on SCr of 1.66 mg/dL (H)). Recent Labs  Lab 10/04/20 1808 10/04/20 2359 10/05/20 0500  WBC 12.7*  20.5* 14.2*    Liver Function Tests: Recent Labs  Lab 10/04/20 1808  AST 189*  ALT 165*  ALKPHOS 55  BILITOT 0.7  PROT 6.9  ALBUMIN 3.6   No results for input(s): LIPASE, AMYLASE in the last 168 hours. No results for input(s): AMMONIA in the last 168 hours.  ABG    Component Value Date/Time   PHART 7.329 (L) 10/04/2020 1821   PCO2ART 40.0 10/04/2020 1821   PO2ART 201 (H) 10/04/2020 1821   HCO3 21.9 10/04/2020 2327   TCO2 23 10/04/2020 2327   ACIDBASEDEF 6.0 (H) 10/04/2020 2327   O2SAT 66.0 10/04/2020 2327     Coagulation Profile: Recent Labs  Lab 10/04/20 1808 10/04/20 2324 10/05/20 0054  INR 1.1 1.3* 1.2    Cardiac Enzymes: No results for input(s): CKTOTAL, CKMB, CKMBINDEX, TROPONINI in the last 168 hours.  HbA1C: No results found for: HGBA1C  CBG: Recent Labs  Lab 10/04/20 2353 10/05/20 0514 10/05/20 0827 10/05/20 1137  GLUCAP 179* 240* 235* 220*    Past Medical History:  He,  has a past medical history of Abnormal echocardiogram (06/07/2019), Arrhythmia, Ascending aortic aneurysm (HCC) 4.2 cm based on CT from December 2020 (01/29/2018), Benign brain tumor [facility]), Benign neoplasm of brain ([place]) (12/19/2017), Chest pain (12/04/2017), Chronic kidney disease, Chronic kidney disease, stage III (moderate) ([place]) (12/19/2017), Dizziness (10/11/2019), History of pulmonary embolus (PE), Hypertension, Left bundle branch block (12/19/2017), Nonsustained ventricular tachycardia ([place]) (10/11/2019), Pain of left hip joint (05/29/2020), Personal history of pulmonary embolism (12/19/2017), Pulmonary hypertension due to thromboembolism ([place]) (03/09/2018), Sinus bradycardia (12/22/2017), and Solitary pulmonary nodule on lung CT (03/08/2018).   Surgical History:   Past Surgical History:  Procedure Laterality Date  . APPENDECTOMY    . CORONARY/GRAFT ACUTE MI REVASCULARIZATION N/A 10/04/2020   Procedure: Coronary/Graft Acute MI Revascularization;  Surgeon: Nelva Bush, MD;  Location: [place];  Service: Cardiovascular;  Laterality: N/A;  . HERNIA REPAIR    . LEFT HEART CATH AND CORONARY ANGIOGRAPHY N/A 10/04/2020   Procedure: LEFT HEART CATH AND CORONARY ANGIOGRAPHY;  Surgeon: Nelva Bush, MD;  Location: [facility];  Service: Cardiovascular;  Laterality: N/A;     Social History:   reports that he quit smoking about 52 years ago. His smoking use included cigarettes. He has a 2.50 pack-year smoking history. He has never used smokeless tobacco. He reports current alcohol use. He reports that he does not use drugs.   Family History:  His family history includes Brain cancer in his sister; Diabetes in his mother; Leukemia in his brother; Melanoma in his brother.   Allergies No Known Allergies   Home Medications  Prior to Admission medications   Medication Sig Start Date End Date  Taking? Authorizing Provider  Azelastine HCl 137 MCG/SPRAY SOLN Place 1 spray into both nostrils at bedtime. 08/13/18   [provider]  B Complex-C (SUPER B COMPLEX PO) Take 1 tablet by mouth daily.    [provider]  Cholecalciferol (VITAMIN D3) 5000 units TABS Take 1 tablet by mouth daily.     [provider]  finasteride (PROSCAR) 5 MG tablet Take 5 mg by mouth daily. 10/17/19   [provider]  Magnesium 400 MG CAPS Take 400 mg by mouth daily.     [provider]  nitroGLYCERIN (NITROSTAT) 0.4 MG SL tablet Place 1 tablet (0.4 mg total) under the tongue every 5 (five) minutes as needed for chest pain. 05/27/20 08/25/20  Tobb, Godfrey Pick, DO  Omega-3 Fatty Acids (FISH OIL) 1360 MG CAPS Take 2 capsules by mouth daily.    [provider]  rosuvastatin (CRESTOR) 20 MG tablet Take 1 tablet (20 mg total) by mouth daily. 08/03/20 11/01/20  Park Liter, MD  tamsulosin (FLOMAX) 0.4 MG CAPS capsule Take 2 capsules by mouth daily.  06/29/18   [provider]  valsartan (DIOVAN) 160 MG tablet Take 1 tablet (160 mg total) by  mouth daily. 09/07/20   Park Liter, MD     Total critical care time: 44 minutes  Performed by: [place] care time was exclusive of separately billable procedures and treating other patients.   Critical care was necessary to treat or prevent imminent or life-threatening deterioration.   Critical care was time spent personally by me on the following activities: development of treatment plan with patient and/or surrogate as well as nursing, discussions with consultants, evaluation of patient's response to treatment, examination of patient, obtaining history from patient or surrogate, ordering and performing treatments and interventions, ordering and review of laboratory studies, ordering and review of radiographic studies, pulse oximetry and re-evaluation of patient's condition.   Jacky Kindle MD [facility] Pager: [phone] Mobile: [phone]

## 2020-10-05 NOTE — Progress Notes (Signed)
  Echocardiogram 2D Echocardiogram has been performed.  Timothy Moran 10/05/2020, 10:48 AM

## 2020-10-06 ENCOUNTER — Encounter (HOSPITAL_COMMUNITY): Payer: Self-pay | Admitting: Internal Medicine

## 2020-10-06 DIAGNOSIS — E119 Type 2 diabetes mellitus without complications: Secondary | ICD-10-CM | POA: Insufficient documentation

## 2020-10-06 DIAGNOSIS — I2121 ST elevation (STEMI) myocardial infarction involving left circumflex coronary artery: Secondary | ICD-10-CM | POA: Diagnosis not present

## 2020-10-06 DIAGNOSIS — D62 Acute posthemorrhagic anemia: Secondary | ICD-10-CM | POA: Diagnosis not present

## 2020-10-06 DIAGNOSIS — I4901 Ventricular fibrillation: Secondary | ICD-10-CM | POA: Diagnosis not present

## 2020-10-06 DIAGNOSIS — I469 Cardiac arrest, cause unspecified: Secondary | ICD-10-CM | POA: Diagnosis not present

## 2020-10-06 DIAGNOSIS — N179 Acute kidney failure, unspecified: Secondary | ICD-10-CM | POA: Diagnosis not present

## 2020-10-06 HISTORY — DX: Type 2 diabetes mellitus without complications: E11.9

## 2020-10-06 LAB — COMPREHENSIVE METABOLIC PANEL
ALT: 138 U/L — ABNORMAL HIGH (ref 0–44)
AST: 246 U/L — ABNORMAL HIGH (ref 15–41)
Albumin: 3.2 g/dL — ABNORMAL LOW (ref 3.5–5.0)
Alkaline Phosphatase: 34 U/L — ABNORMAL LOW (ref 38–126)
Anion gap: 10 (ref 5–15)
BUN: 40 mg/dL — ABNORMAL HIGH (ref 8–23)
CO2: 20 mmol/L — ABNORMAL LOW (ref 22–32)
Calcium: 8.2 mg/dL — ABNORMAL LOW (ref 8.9–10.3)
Chloride: 107 mmol/L (ref 98–111)
Creatinine, Ser: 2.63 mg/dL — ABNORMAL HIGH (ref 0.61–1.24)
GFR, Estimated: 24 mL/min — ABNORMAL LOW (ref >60)
Glucose, Bld: 190 mg/dL — ABNORMAL HIGH (ref 70–99)
Potassium: 4.9 mmol/L (ref 3.5–5.1)
Sodium: 137 mmol/L (ref 135–145)
Total Bilirubin: 0.6 mg/dL (ref 0.3–1.2)
Total Protein: 5.8 g/dL — ABNORMAL LOW (ref 6.5–8.1)

## 2020-10-06 LAB — PHOSPHORUS
Phosphorus: 5.6 mg/dL — ABNORMAL HIGH (ref 2.5–4.6)
Phosphorus: 4.6 mg/dL (ref 2.5–4.6)

## 2020-10-06 LAB — LIPID PANEL
Cholesterol: 114 mg/dL (ref 0–200)
HDL: 39 mg/dL — ABNORMAL LOW (ref >40)
LDL Cholesterol: 42 mg/dL (ref 0–99)
Total CHOL/HDL Ratio: 2.9 RATIO
Triglycerides: 163 mg/dL — ABNORMAL HIGH (ref <150)
VLDL: 33 mg/dL (ref 0–40)

## 2020-10-06 LAB — COOXEMETRY PANEL
Carboxyhemoglobin: 0.7 % (ref 0.5–1.5)
Methemoglobin: 1.1 % (ref 0.0–1.5)
O2 Saturation: 64.4 %
Total hemoglobin: 10.7 g/dL — ABNORMAL LOW (ref 12.0–16.0)

## 2020-10-06 LAB — CBC
HCT: 32.8 % — ABNORMAL LOW (ref 39.0–52.0)
Hemoglobin: 11 g/dL — ABNORMAL LOW (ref 13.0–17.0)
MCH: 31.2 pg (ref 26.0–34.0)
MCHC: 33.5 g/dL (ref 30.0–36.0)
MCV: 92.9 fL (ref 80.0–100.0)
Platelets: 191 10*3/uL (ref 150–400)
RBC: 3.53 MIL/uL — ABNORMAL LOW (ref 4.22–5.81)
RDW: 14 % (ref 11.5–15.5)
WBC: 12.8 10*3/uL — ABNORMAL HIGH (ref 4.0–10.5)
nRBC: 0 % (ref 0.0–0.2)

## 2020-10-06 LAB — GLUCOSE, CAPILLARY
Glucose-Capillary: 187 mg/dL — ABNORMAL HIGH (ref 70–99)
Glucose-Capillary: 221 mg/dL — ABNORMAL HIGH (ref 70–99)
Glucose-Capillary: 143 mg/dL — ABNORMAL HIGH (ref 70–99)
Glucose-Capillary: 128 mg/dL — ABNORMAL HIGH (ref 70–99)
Glucose-Capillary: 124 mg/dL — ABNORMAL HIGH (ref 70–99)

## 2020-10-06 LAB — MAGNESIUM
Magnesium: 2 mg/dL (ref 1.7–2.4)
Magnesium: 2 mg/dL (ref 1.7–2.4)

## 2020-10-06 LAB — TSH: TSH: 3.917 u[IU]/mL (ref 0.350–4.500)

## 2020-10-06 LAB — HEMOGLOBIN A1C
Hgb A1c MFr Bld: 7 % — ABNORMAL HIGH (ref 4.8–5.6)
Mean Plasma Glucose: 154.2 mg/dL

## 2020-10-06 MED ORDER — ALBUMIN HUMAN 25 % IV SOLN
12.5000 g | Freq: Once | INTRAVENOUS | Status: AC
  Administered 2020-10-06: 12.5 g via INTRAVENOUS
  Filled 2020-10-06: qty 50

## 2020-10-06 MED ORDER — ROSUVASTATIN CALCIUM 20 MG PO TABS: 20.0000 mg | ORAL_TABLET | Freq: Every day | ORAL | Status: DC

## 2020-10-06 MED ORDER — ENOXAPARIN SODIUM 30 MG/0.3ML ~~LOC~~ SOLN
30.0000 mg | SUBCUTANEOUS | Status: DC
  Administered 2020-10-07 – 2020-10-13 (×7): 30 mg via SUBCUTANEOUS
  Filled 2020-10-06 (×7): qty 0.3

## 2020-10-06 MED ORDER — SODIUM CHLORIDE 0.9 % IV SOLN
250.0000 mL | INTRAVENOUS | Status: DC | PRN
  Administered 2020-10-07 – 2020-10-10 (×2): 250 mL via INTRAVENOUS

## 2020-10-06 MED ORDER — ACETAMINOPHEN 650 MG RE SUPP
650.0000 mg | RECTAL | Status: DC | PRN
  Administered 2020-10-06: 650 mg via RECTAL
  Filled 2020-10-06: qty 1

## 2020-10-06 MED ORDER — ACETAMINOPHEN 325 MG PO TABS
650.0000 mg | ORAL_TABLET | Freq: Four times a day (QID) | ORAL | Status: DC | PRN
  Administered 2020-10-07: 650 mg via ORAL
  Filled 2020-10-06: qty 2

## 2020-10-06 MED ORDER — ENOXAPARIN SODIUM 30 MG/0.3ML ~~LOC~~ SOLN: 30.0000 mg | SUBCUTANEOUS | Status: DC

## 2020-10-06 MED ORDER — FENTANYL CITRATE (PF) 100 MCG/2ML IJ SOLN
25.0000 ug | Freq: Once | INTRAMUSCULAR | Status: AC
  Administered 2020-10-06: 25 ug via INTRAVENOUS
  Filled 2020-10-06: qty 2

## 2020-10-06 MED ORDER — BUSPIRONE HCL 10 MG PO TABS: 15.0000 mg | ORAL_TABLET | Freq: Three times a day (TID) | ORAL | Status: DC

## 2020-10-06 MED ORDER — INSULIN ASPART 100 UNIT/ML ~~LOC~~ SOLN
0.0000 [IU] | SUBCUTANEOUS | Status: DC
  Administered 2020-10-06 – 2020-10-07 (×5): 1 [IU] via SUBCUTANEOUS

## 2020-10-06 MED ORDER — ASPIRIN EC 81 MG PO TBEC: 81.0000 mg | DELAYED_RELEASE_TABLET | Freq: Every day | ORAL | Status: DC

## 2020-10-06 MED ORDER — TICAGRELOR 90 MG PO TABS
90.0000 mg | ORAL_TABLET | Freq: Two times a day (BID) | ORAL | Status: DC
  Administered 2020-10-07 (×2): 90 mg via ORAL
  Filled 2020-10-06 (×2): qty 1

## 2020-10-06 MED ORDER — SODIUM CHLORIDE 0.9 % IV SOLN
0.7500 ug/kg/min | INTRAVENOUS | Status: DC
  Administered 2020-10-06: 0.75 ug/kg/min via INTRAVENOUS
  Filled 2020-10-06 (×4): qty 50

## 2020-10-06 MED ORDER — SODIUM CHLORIDE 0.9 % IV SOLN: INTRAVENOUS | Status: AC

## 2020-10-06 MED ORDER — BUSPIRONE HCL 10 MG PO TABS
15.0000 mg | ORAL_TABLET | Freq: Three times a day (TID) | ORAL | Status: DC
  Administered 2020-10-07: 15 mg via ORAL
  Filled 2020-10-06: qty 2

## 2020-10-06 MED ORDER — ASPIRIN 81 MG PO CHEW
81.0000 mg | CHEWABLE_TABLET | Freq: Every day | ORAL | Status: DC
  Administered 2020-10-07: 81 mg via ORAL
  Filled 2020-10-06: qty 1

## 2020-10-06 MED ORDER — ROSUVASTATIN CALCIUM 20 MG PO TABS
20.0000 mg | ORAL_TABLET | Freq: Every day | ORAL | Status: DC
Start: 2020-10-07 — End: 2020-10-06

## 2020-10-06 MED ORDER — TICAGRELOR 90 MG PO TABS: 90.0000 mg | ORAL_TABLET | Freq: Two times a day (BID) | ORAL | Status: DC

## 2020-10-06 NOTE — Procedures (Signed)
Patient Name: Timothy Moran  MRN: [id number]  Epilepsy Attending: Lora Havens  Referring Physician/Provider: Dr Rollene Fare Duration: 10/05/2020 2263 to 10/06/2020 3354  Patient history: 75 year old man status post cardiac arrest.  EEG to evaluate for seizures.  Level of alertness: Comatose  AEDs during EEG study: Versed  Technical aspects: This EEG study was done with scalp electrodes positioned according to the 10-20 International system of electrode placement. Electrical activity was acquired at a sampling rate of 500Hz  and reviewed with a high frequency filter of 70Hz  and a low frequency filter of 1Hz . EEG data were recorded continuously and digitally stored.   Description: EEG showed continuous generalized 3 to 5 Hz theta- delta slowing. EEG was reactive to noxious stimulation.  Hyperventilation and photic stimulation were not performed.     ABNORMALITY -Continuous slow, generalized  IMPRESSION: This study is suggestive of severe diffuse encephalopathy, nonspecific etiology.  No seizures or epileptiform discharges were seen throughout the recording.  Timothy Moran [name]

## 2020-10-06 NOTE — Progress Notes (Signed)
Progress Note  Patient Name: Timothy Moran Date of Encounter: 10/06/2020  [facility] Cardiologist: Jenne Campus, MD  Subjective   Urine output decreased, received albumin overnight. CVP 3 this AM. Had bradycardia into the 40s with initiation of precedex, has been more stable in the 70s recently. He opens eyes to voice and follows commands, tries to communicate despite ET tube.  Inpatient Medications    Scheduled Meds: . aspirin  81 mg Per Tube Daily  . busPIRone  15 mg Per Tube TID  . chlorhexidine gluconate (MEDLINE KIT)  15 mL Mouth Rinse BID  . Chlorhexidine Gluconate Cloth  6 each Topical Daily  . [START ON 10/07/2020] enoxaparin (LOVENOX) injection  30 mg Subcutaneous Q24H  . feeding supplement (PROSource TF)  90 mL Per Tube TID  . insulin aspart  0-9 Units Subcutaneous Q4H  . mouth rinse  15 mL Mouth Rinse 10 times per day  . pantoprazole (PROTONIX) IV  40 mg Intravenous QHS  . rosuvastatin  20 mg Per Tube Daily  . sodium chloride flush  3 mL Intravenous Q12H  . Thrombi-Pad  1 each Topical Once  . ticagrelor  90 mg Per Tube BID   Continuous Infusions: . sodium chloride 10 mL/hr at 10/06/20 0900  . sodium chloride    . albumin human    . amiodarone 30 mg/hr (10/06/20 0900)  . feeding supplement (VITAL 1.5 CAL) Stopped (10/06/20 0744)  . sodium chloride     PRN Meds: sodium chloride, acetaminophen (TYLENOL) oral liquid 160 mg/5 mL, ondansetron (ZOFRAN) IV, sodium chloride flush   Vital Signs    Vitals:   10/06/20 0700 10/06/20 0800 10/06/20 0843 10/06/20 0900  BP: (!) 107/59 (!) 109/55 136/72 111/60  Pulse: (!) 54 (!) 56 62 (!) 58  Resp: _0 Temp:      TempSrc:      SpO2: 90% (!) 89% 93% 92%  Weight:      Height:        Intake/Output Summary (Last 24 hours) at 10/06/2020 1007 Last data filed at 10/06/2020 0900 Gross per 24 hour  Intake 1915.45 ml  Output 320 ml  Net 1595.45 ml   Last 3 Weights 10/06/2020 10/04/2020 10/04/2020  Weight  (lbs) 240 lb 4.8 oz 229 lb 15 oz 227 lb 1.2 oz  Weight (kg) 109 kg 104.3 kg 103 kg      Telemetry    Sinus bradycardia with intermittent sinus arrhythmia and PVCs. 4 beats NSVT yesterday- Personally Reviewed  ECG    1/24 SR, 1st degree AV bock, LBBB, ST elevations resolved- Personally Reviewed  Physical Exam   GEN: intubated but alert, responds to voice, attempts to communicate. In no apparent distress NECK: No JVD CARDIAC: regular rhythm, normal S1 and S2, no rubs or gallops. No murmur. Left subclavian central line site c/d/i today VASCULAR: Radial pulses 2+ bilaterally.  RESPIRATORY:  Clear to auscultation without rales, wheezing or rhonchi  ABDOMEN: Soft, non-tender, non-distended MUSCULOSKELETAL:  Moves all 4 limbs independently, moving head/neck despite c collar SKIN: Warm and dry, no edema NEUROLOGIC:  No focal neuro deficits noted. PSYCHIATRIC:  Normal affect   Labs    High Sensitivity Troponin:   Recent Labs  Lab 10/04/20 1808 10/04/20 2324 10/05/20 0500  TROPONINIHS 75* >27,000* >27,000*      Chemistry Recent Labs  Lab 10/04/20 1808 10/04/20 1821 10/04/20 2324 10/04/20 2327 10/05/20 0500 10/06/20 0317  NA 139   < > 135 142 137 137  K 4.3   < > 4.9 5.1 5.1 4.9  CL 107  --  105  --  107 107  CO2 19*  --  18*  --  20* 20*  GLUCOSE 217*  --  218*  --  261* 190*  BUN 20  --  23  --  25* 40*  CREATININE 1.86*  --  1.74*  --  1.66* 2.63*  CALCIUM 8.6*  --  7.8*  --  8.0* 8.2*  PROT 6.9  --   --   --   --  5.8*  ALBUMIN 3.6  --   --   --   --  3.2*  AST 189*  --   --   --   --  246*  ALT 165*  --   --   --   --  138*  ALKPHOS 55  --   --   --   --  34*  BILITOT 0.7  --   --   --   --  0.6  GFRNONAA 37*  --  40*  --  42* 24*  ANIONGAP 13  --  12  --  10 10   < > = values in this interval not displayed.     Hematology Recent Labs  Lab 10/04/20 2359 10/05/20 0500 10/06/20 0317  WBC 20.5* 14.2* 12.8*  RBC 4.31 4.14* 3.53*  HGB 13.3 12.6* 11.0*   HCT 40.0 38.5* 32.8*  MCV 92.8 93.0 92.9  MCH 30.9 30.4 31.2  MCHC 33.3 32.7 33.5  RDW 13.4 13.5 14.0  PLT 243 241 191    BNP Recent Labs  Lab 10/04/20 1809  BNP 51.6     DDimer No results for input(s): DDIMER in the last 168 hours.   Radiology    DG Chest 1 View  Result Date: 10/04/2020 CLINICAL DATA:  Central line placement EXAM: CHEST  1 VIEW COMPARISON:  10/04/2020 FINDINGS: Single frontal view of the chest demonstrates stable endotracheal tube and enteric catheter. Left subclavian central venous catheter tip overlies superior vena cava. The cardiac silhouette is enlarged but stable. Persistent central vascular congestion without consolidation, effusion, or pneumothorax. IMPRESSION: 1. No complication after left subclavian catheter placement. 2. Stable central vascular congestion. Electronically Signed   By: Randa Ngo M.D.   On: 10/04/2020 22:54   DG Abd 1 View  Result Date: 10/04/2020 CLINICAL DATA:  Enteric catheter placement, history of cardiac arrest EXAM: ABDOMEN - 1 VIEW COMPARISON:  None. FINDINGS: Supine frontal view of the upper abdomen was obtained, excluding the lower pelvis and right flank by collimation. Enteric catheter passes below diaphragm tip and side port projecting over the gastric fundus. External defibrillator pads overlie the lower chest. Excreted contrast within the kidneys. Paucity of bowel gas. IMPRESSION: 1. Enteric catheter overlying gastric fundus. Electronically Signed   By: Randa Ngo M.D.   On: 10/04/2020 22:15   CT Head Wo Contrast  Result Date: 10/04/2020 CLINICAL DATA:  Initial evaluation for acute mental status change, history of cardiac arrest, STEMI. EXAM: CT HEAD WITHOUT CONTRAST TECHNIQUE: Contiguous axial images were obtained from the base of the skull through the vertex without intravenous contrast. COMPARISON:  Previous MRI from 07/26/2020. FINDINGS: Brain: Cerebral volume within normal limits for age. Underlying mild chronic  small vessel ischemic disease. No acute intracranial hemorrhage. No acute large vessel territory infarct. Left parafalcine calcified meningioma seen at the posterior left frontoparietal region, measuring 2.7 x 2.1 cm (series 3, image 22). Finding is relatively  unchanged from previous. No significant surrounding edema or mass effect. An additional on plaque meningioma overlying the anterior left frontal convexity measures approximately 3.7 x 1.1 cm, also not significantly changed. Scattered areas of internal calcification seen within this lesion as well. No significant localized edema or mass effect. No other mass lesion. No midline shift or hydrocephalus. No extra-axial fluid collection. Vascular: No hyperdense vessel. Skull: Scalp soft tissues within normal limits.  Calvarium intact. Sinuses/Orbits: Globes and orbital soft tissues within normal limits. Scattered mucosal thickening noted within the ethmoidal air cells. Endotracheal and enteric tubes partially visualized. No mastoid effusion. Other: None. IMPRESSION: 1. No acute intracranial abnormality. 2. Stable left parafalcine and anterior left frontal meningiomas as above. No significant localized edema or mass effect. 3. Underlying mild chronic small vessel ischemic disease. Electronically Signed   By: Jeannine Boga M.D.   On: 10/04/2020 20:07   CARDIAC CATHETERIZATION  Result Date: 10/04/2020 Conclusions: 1. Significant two-vessel coronary artery disease including thrombotic occlusion of the distal LCx, which is the culprit for the patient's inferior STEMI as well as sequential 50% and 80% proximal/mid LAD lesions. 2. Mildly elevated left ventricular filling pressure (LVEDP 20-25 mmHg). 3. Successful PCI to distal LCx using Resolute Onyx 2.5 x 30 mm drug-eluting stent complicated by no reflow treated with intracoronary adenosine and nitroglycerin, as well as aggressive antiplatelet therapy. Final angiogram shows 0% residual stenosis with TIMI-2  flow. Recommendations: 1. Admit to 2H-ICU for post STEMI/cardiac arrest monitoring. Consider targeted temperature management; critical care medicine has been consulted to assist with targeted temperature and ventilator management. 2. Continue tirofiban infusion for 6 hours. 3. Loaded with ticagrelor when patient arrives in ICU; patient should complete 12 months of dual antiplatelet therapy with aspirin and ticagrelor. 4. Obtain echocardiogram. 5. Aggressive secondary prevention of coronary artery disease including high intensity statin therapy. 6. Continue IV amiodarone overnight. 7. Consider staged PCI to LAD. Nelva Bush, MD [facility]   DG Chest Port 1 View  Result Date: 10/04/2020 CLINICAL DATA:  Intubated, cardiac arrest, myocardial infarction EXAM: PORTABLE CHEST 1 VIEW COMPARISON:  10/04/2020 at 6:05 p.m. FINDINGS: Single frontal view of the chest demonstrates endotracheal tube overlying tracheal air column tip at level of thoracic inlet. Enteric catheter tip and side port project over gastric fundus. Cardiac silhouette is enlarged but stable. Central vascular congestion without airspace disease, effusion, or pneumothorax. No acute bony abnormalities. IMPRESSION: 1. Central vascular congestion without overt edema. 2. Support devices as above. Electronically Signed   By: Randa Ngo M.D.   On: 10/04/2020 22:16   DG Chest Port 1 View  Result Date: 10/04/2020 CLINICAL DATA:  75 year old male with history of cardiac arrest status post CPR. EXAM: PORTABLE CHEST 1 VIEW COMPARISON:  Chest x-ray 06/25/2020. FINDINGS: An endotracheal tube is in place with tip 5.3 cm above the carina. Nasogastric tube extending into the proximal stomach. Defibrillator pad projecting over the lower left hemithorax. Lung volumes are low. No acute consolidative airspace disease. No pleural effusions. No pneumothorax. No evidence of pulmonary edema. Mild cardiomegaly. The patient is rotated to the right on today's exam,  resulting in distortion of the mediastinal contours and reduced diagnostic sensitivity and specificity for mediastinal pathology. IMPRESSION: 1. Support apparatus, as above. 2. Mild cardiomegaly. Electronically Signed   By: Vinnie Langton M.D.   On: 10/04/2020 18:21   EEG adult  Result Date: 10/05/2020 Lora Havens, MD     10/06/2020  8:48 AM Patient Name: Timothy Moran MRN: [id number] Epilepsy Attending:  Lora Havens Referring Physician/Provider: Dr Rollene Fare Date: 10/05/2020 Duration: 25.56 minutes Patient history: 75 year old man status post cardiac arrest.  EEG to evaluate for seizures. Level of alertness: Comatose AEDs during EEG study: Versed Technical aspects: This EEG study was done with scalp electrodes positioned according to the 10-20 International system of electrode placement. Electrical activity was acquired at a sampling rate of 500Hz and reviewed with a high frequency filter of 70Hz and a low frequency filter of 1Hz. EEG data were recorded continuously and digitally stored. Description: EEG showed continuous generalized 3 to 5 Hz theta- delta slowing. EEG was reactive to noxious stimulation.  Hyperventilation and photic stimulation were not performed.   ABNORMALITY -Continuous slow, generalized IMPRESSION: This study is suggestive of severe diffuse encephalopathy, nonspecific etiology.  No seizures or epileptiform discharges were seen throughout the recording. Lora Havens   ECHOCARDIOGRAM COMPLETE  Result Date: 10/05/2020    ECHOCARDIOGRAM REPORT   Patient Name:   JC VERON Date of Exam: 10/05/2020 Medical Rec #:  [id number]          Height:       73.0 in Accession #:    [id number]         Weight:       229.9 lb Date of Birth:  1946-01-01           BSA:          2.283 m Patient Age:    62 years           BP:           121/73 mmHg Patient Gender: M                  HR:           61 bpm. Exam Location:  Inpatient Procedure: 2D Echo Indications:    cardiac arrest   History:        Patient has prior history of Echocardiogram examinations, most                 recent 06/19/2020. Chronic kidney disease, Arrythmias:LBBB and                 Ventricular Fibrillation; Signs/Symptoms:Shortness of Breath.  Sonographer:    Johny Chess Referring Phys: [place]  1. Since the study on 06/19/2020 LVEF has decreased, now severely impaired at 20-25% and diffuse hypokinesis.  2. Left ventricular ejection fraction, by estimation, is 20 to 25%. The left ventricle has severely decreased function. The left ventricle demonstrates global hypokinesis. There is mild concentric left ventricular hypertrophy. Left ventricular diastolic  parameters are consistent with Grade I diastolic dysfunction (impaired relaxation).  3. Right ventricular systolic function is normal. The right ventricular size is normal.  4. The mitral valve is normal in structure. Mild mitral valve regurgitation. No evidence of mitral stenosis.  5. The aortic valve is normal in structure. Aortic valve regurgitation is trivial. No aortic stenosis is present.  6. The inferior vena cava is normal in size with greater than 50% respiratory variability, suggesting right atrial pressure of 3 mmHg. FINDINGS  Left Ventricle: Left ventricular ejection fraction, by estimation, is 20 to 25%. The left ventricle has severely decreased function. The left ventricle demonstrates global hypokinesis. The left ventricular internal cavity size was normal in size. There is mild concentric left ventricular hypertrophy. Abnormal (paradoxical) septal motion, consistent with left bundle branch block. Left ventricular diastolic function could not be evaluated due to atrial fibrillation.  Left ventricular diastolic parameters are consistent with Grade I diastolic dysfunction (impaired relaxation). Normal left ventricular filling pressure. Right Ventricle: The right ventricular size is normal. No increase in right ventricular wall  thickness. Right ventricular systolic function is normal. Left Atrium: Left atrial size was normal in size. Right Atrium: Right atrial size was normal in size. Pericardium: There is no evidence of pericardial effusion. Mitral Valve: The mitral valve is normal in structure. Mild mitral valve regurgitation. No evidence of mitral valve stenosis. Tricuspid Valve: The tricuspid valve is normal in structure. Tricuspid valve regurgitation is not demonstrated. No evidence of tricuspid stenosis. Aortic Valve: The aortic valve is normal in structure. Aortic valve regurgitation is trivial. No aortic stenosis is present. Pulmonic Valve: The pulmonic valve was normal in structure. Pulmonic valve regurgitation is not visualized. No evidence of pulmonic stenosis. Aorta: The aortic root is normal in size and structure. Venous: The inferior vena cava is normal in size with greater than 50% respiratory variability, suggesting right atrial pressure of 3 mmHg. IAS/Shunts: No atrial level shunt detected by color flow Doppler.  LEFT VENTRICLE PLAX 2D LVIDd:         5.20 cm  Diastology LVIDs:         4.40 cm  LV e' medial:    5.22 cm/s LV PW:         1.20 cm  LV E/e' medial:  9.6 LV IVS:        1.20 cm  LV e' lateral:   4.03 cm/s LVOT diam:     2.50 cm  LV E/e' lateral: 12.4 LV SV:         81 LV SV Index:   36 LVOT Area:     4.91 cm  RIGHT VENTRICLE RV S prime:     18.40 cm/s TAPSE (M-mode): 1.9 cm LEFT ATRIUM             Index LA diam:        3.80 cm 1.66 cm/m LA Vol (A2C):   69.3 ml 30.35 ml/m LA Vol (A4C):   51.9 ml 22.73 ml/m LA Biplane Vol: 60.0 ml 26.28 ml/m  AORTIC VALVE LVOT Vmax:   78.30 cm/s LVOT Vmean:  50.400 cm/s LVOT VTI:    0.166 m  AORTA Ao Asc diam: 3.80 cm MITRAL VALVE MV Area (PHT): 2.87 cm    SHUNTS MV Decel Time: 264 msec    Systemic VTI:  0.17 m MV E velocity: 50.10 cm/s  Systemic Diam: 2.50 cm MV A velocity: 83.60 cm/s MV E/A ratio:  0.60 Ena Dawley MD Electronically signed by Ena Dawley MD Signature  Date/Time: 10/05/2020/11:54:46 AM    Final     Cardiac Studies   Cath 10/04/20 Conclusions: 1. Significant two-vessel coronary artery disease including thrombotic occlusion of the distal LCx, which is the culprit for the patient's inferior STEMI as well as sequential 50% and 80% proximal/mid LAD lesions. 2. Mildly elevated left ventricular filling pressure (LVEDP 20-25 mmHg). 3. Successful PCI to distal LCx using Resolute Onyx 2.5 x 30 mm drug-eluting stent complicated by no reflow treated with intracoronary adenosine and nitroglycerin, as well as aggressive antiplatelet therapy. Final angiogram shows 0% residual stenosis with TIMI-2 flow.  Recommendations: 1. Admit to 2H-ICU for post STEMI/cardiac arrest monitoring. Consider targeted temperature management; critical care medicine has been consulted to assist with targeted temperature and ventilator management. 2. Continue tirofiban infusion for 6 hours. 3. Loaded with ticagrelor when patient arrives in ICU; patient should complete 12  months of dual antiplatelet therapy with aspirin and ticagrelor. 4. Obtain echocardiogram. 5. Aggressive secondary prevention of coronary artery disease including high intensity statin therapy. 6. Continue IV amiodarone overnight. 7. Consider staged PCI to LAD.  Echocardiogram 10/05/20 1. Since the study on 06/19/2020 LVEF has decreased, now severely impaired  at 20-25% and diffuse hypokinesis.  2. Left ventricular ejection fraction, by estimation, is 20 to 25%. The  left ventricle has severely decreased function. The left ventricle  demonstrates global hypokinesis. There is mild concentric left ventricular  hypertrophy. Left ventricular diastolic  parameters are consistent with Grade I diastolic dysfunction (impaired  relaxation).  3. Right ventricular systolic function is normal. The right ventricular  size is normal.  4. The mitral valve is normal in structure. Mild mitral valve  regurgitation. No  evidence of mitral stenosis.  5. The aortic valve is normal in structure. Aortic valve regurgitation is  trivial. No aortic stenosis is present.  6. The inferior vena cava is normal in size with greater than 50%  respiratory variability, suggesting right atrial pressure of 3 mmHg.   Patient Profile     75 y.o. male with PMH LBBB, cardiomyopathy (EF 40-45%), TAA, multiple meningiomas, stage 3 chronic kidney disease, prior PE (x2, reported as provoked), hypertension who presented 10/04/20 as a VF cardiac arrest with inferior STEMI  Assessment & Plan    Inferior/inferolateral STEMI, complicated by cardiac arrest/VF as initial rhythm -s/p urgent PCI of distal LCx. Initial no-reflow, but improved with nitro, adenosine, and aggrastat -echo now with EF 20-25% -continue aspirin, ticagrelor -once extubated/clinically stable, consider staged PCI to LAD -he is bradycardic, will hold on beta blocker at this time. Monitoring closely given precedex -peak hsTn 27,000. Initial was 75 -on rosuvastatin currently. Transaminases up slightly, monitor closely. If worsens, will need to hold statin -lipids show LDL 42. A1c 7.0, consistent with diabetes. -completed targeted temperature management due to lack of purposeful movement. EEG 10/05/20 shows generalized slowing, consistent with severe diffuse encephalopathy. No seizures/epileptiform discharges -given VF arrest, remains on amiodarone  C-collar -placed at time of arrest. CT head did not include neck. If GCS returns to normal, can clear clinically, otherwise would need CT neck for clearance. Would prefer not to transport to CT while intubated unless no other option.  Acute hypoxic/hypercapnic respiratory failure -currently intubated, ventilator management per PCCM -he appears alert this AM. Will defer to PCCM on extubation based on SBT  Acute kidney injury on chronic kidney disease stage 3: -presented Cr 1.86, initially down but now up to 2.63 today.  Prior was 1.38 -received albumin overnight for low urine output -checked coox this AM, 64%. Suggests that he is not low output. May be dry given low CVP and low urine output. Will give gentle hydration.  Acute blood loss anemia -severe oozing 1/24 at site of central line, now resolved -Hgb 13.9 -> 12.6 -> 11.0 -will try to manage conservatively. Liked exacerbated by aggrastat.   History of prior pulmonary embolisms -pre Dr. Wendy Poet note 08/03/20, plan for 3 mons of anticoagulation with apixaban. However, I do not see this on his medicine list. Will attempt to get clarification from his wife.  LBBB -chronic  Cardiomyopathy, EF 40-45% prior to cardiac arrest -Echo 10/05/20 now 20-25% -was on valsartan as an outpatient -will add guideline directed medical therapy as clinical condition allows -now with diagnosis of diabetes, consider SGLT2i when more clinically stable -Coox today stable at 64%, does not suggest low output, similar to reading at cath 1/23  Hypertension: -has been labile, range 87/49-169/69. Will monitor. Will add back medications as renal function and blood pressure allow  CRITICAL CARE Patient is critically ill with multiple organ systems affected and requires high complexity decision making. Total critical care time: 40 minutes. This time includes gathering of history, evaluation of patient's response to treatment, examination of patient, review of laboratory and imaging studies, and coordination with consultants. Greater than 50% of time spent in direct patient care.  For questions or updates, please contact [place] Please consult [email or web address] for contact info under     Signed, Buford Dresser, MD  10/06/2020, 10:07 AM

## 2020-10-06 NOTE — Plan of Care (Signed)
  Problem: Education: Goal: Knowledge of General Education information will improve Description: Including pain rating scale, medication(s)/side effects and non-pharmacologic comfort measures Outcome: Progressing   Problem: Clinical Measurements: Goal: Ability to maintain clinical measurements within normal limits will improve Outcome: Progressing Goal: Will remain free from infection Outcome: Progressing Goal: Diagnostic test results will improve Outcome: Progressing Goal: Respiratory complications will improve Outcome: Progressing   Problem: Education: Goal: Understanding of CV disease, CV risk reduction, and recovery process will improve Outcome: Progressing Goal: Individualized Educational Video(s) Outcome: Progressing   Problem: Activity: Goal: Ability to return to baseline activity level will improve Outcome: Progressing   Problem: Cardiovascular: Goal: Ability to achieve and maintain adequate cardiovascular perfusion will improve Outcome: Progressing Goal: Vascular access site(s) Level 0-1 will be maintained Outcome: Progressing   Problem: Health Behavior/Discharge Planning: Goal: Ability to safely manage health-related needs after discharge will improve Outcome: Progressing   Problem: Activity: Goal: Ability to tolerate increased activity will improve Outcome: Progressing   Problem: Respiratory: Goal: Ability to maintain a clear airway and adequate ventilation will improve Outcome: Progressing   Problem: Role Relationship: Goal: Method of communication will improve Outcome: Progressing   

## 2020-10-06 NOTE — Progress Notes (Signed)
vLTM EEG complete. No skin breakdown 

## 2020-10-06 NOTE — Progress Notes (Signed)
CARDIAC REHAB PHASE I   MI education began with pt and wife. Pt educated on importance of ASA and Brilinta. Pt given MI book, stent card, and heart healthy diet. Questions and concerns addressed. Pts wife anxious, support provided.  [place], RN BSN 10/06/2020 2:57 PM

## 2020-10-06 NOTE — Progress Notes (Signed)
   NAME:  Timothy Moran, MRN:  [id number], DOB:  04-20-1946, LOS: 2 ADMISSION DATE:  10/04/2020, CONSULTATION DATE: October 05, 2019 REFERRING MD: Cardiology, CHIEF COMPLAINT: Cardiac arrest  Brief History:  75 year old male status post V. Fib cardiac arrest witnessed by wife, shocked 4 and 2 epi.  Patient was noted to have acute inferior/inferolateral STEMI, underwent cardiac cath with stent and circumflex came up unresponsive nonpurposeful movements initiated hypothermia protocol.  Past Medical History:  History of meningioma, spinal tumor, benign lung nodules PET positive, chronic kidney disease stage IV, hypertension, hyperlipidemia, ascending aortic aneurysm and left bundle branch block  Significant Hospital Events:  1/23 cardiac cath with cardiac stent 1/24 hypothermia protocol  Consults:  Cardiology  Procedures:  1/23 cardiac cath with a stent in left circumflex 1/23 ETT > 1/25 1/23 right subclavian CVC  Objective   Blood pressure 111/60, pulse (!) 58, temperature (!) 97.2 F (36.2 C), resp. rate 13, height 6\' 1"  (1.854 m), weight 109 kg, SpO2 92 %. CVP:  [3 mmHg-8 mmHg] 4 mmHg  Vent Mode: PSV;CPAP FiO2 (%):  [40 %] 40 % Set Rate:  [16 bmp] 16 bmp Vt Set:  [640 mL] 640 mL PEEP:  [5 cmH20] 5 cmH20 Pressure Support:  [5 cmH20] 5 cmH20 Plateau Pressure:  [20 cmH20-22 cmH20] 22 cmH20   Intake/Output Summary (Last 24 hours) at 10/06/2020 1007 Last data filed at 10/06/2020 0900 Gross per 24 hour  Intake 1915.45 ml  Output 320 ml  Net 1595.45 ml   Filed Weights   10/04/20 1758 10/04/20 2115 10/06/20 0500  Weight: 103 kg 104.3 kg 109 kg    Examination: General: Adult male, resting in bed, in NAD. Neuro: Awake on vent, following commands. HEENT: [place]/AT. Sclerae anicteric. ETT in place.  Cervical collar in place Cardiovascular: RRR, no M/R/G.  Lungs: Respirations even and unlabored.  CTA bilaterally, No W/R/R. Abdomen: BS x 4, soft, NT/ND.  Musculoskeletal: No gross  deformities, no edema.  Skin: Intact, warm, no rashes.   Assessment & Plan:   Acute inferior/inferior lateral ST elevation MI s/p DES. Status post V. fib cardiac arrest - s/p hypothermia protocol to 36 degrees. Hx LBB, Cardiomyopathy (EF now 20-25% on echo 1/24), HTN. - Cardiology following. - Continue ASA, Ticagrelor, Amiodarone. - Cards considering staged PCI to LAD.  Acute hypoxic/hypercapnic respiratory failure - s/p intubation. - Extubate now. - Bedside swallow eval following extubation.  Acute kidney injury on CKD stage IIIa - worsening with oliguria, did have some response to Albumin overnight. - Additional Albumin dose now. - Continue gentle hydration. - Follow I/O's and BMP.  Acute blood loss anemia from oozing CVL site - improved. - Transfuse for Hgb < 7.  Hx PE - had supposedly been prescribed APixaban x 3 months but not listed on MAR. - Cards to clarify with family.  C-Collar in place. - Clear clinically once extubated as he is already following commands.   CC time: 30 min.  Rest per primary team.  Will follow up after extubation and if remains stable, then PCCM will sign off at that point.  Please do not hesitate to call back if we can be of further assistance.   Montey Hora, [facility] For pager details, please see AMION 10/06/2020, 10:10 AM

## 2020-10-06 NOTE — Plan of Care (Signed)
  Problem: Education: Goal: Knowledge of General Education information will improve Description: Including pain rating scale, medication(s)/side effects and non-pharmacologic comfort measures Outcome: Progressing   Problem: Clinical Measurements: Goal: Ability to maintain clinical measurements within normal limits will improve Outcome: Progressing Goal: Will remain free from infection Outcome: Progressing Goal: Diagnostic test results will improve Outcome: Progressing Goal: Respiratory complications will improve Outcome: Progressing   Problem: Education: Goal: Understanding of CV disease, CV risk reduction, and recovery process will improve Outcome: Progressing Goal: Individualized Educational Video(s) Outcome: Progressing   Problem: Activity: Goal: Ability to return to baseline activity level will improve Outcome: Progressing   Problem: Cardiovascular: Goal: Ability to achieve and maintain adequate cardiovascular perfusion will improve Outcome: Progressing Goal: Vascular access site(s) Level 0-1 will be maintained Outcome: Progressing   Problem: Health Behavior/Discharge Planning: Goal: Ability to safely manage health-related needs after discharge will improve Outcome: Progressing   Problem: Activity: Goal: Ability to tolerate increased activity will improve Outcome: Progressing   Problem: Respiratory: Goal: Ability to maintain a clear airway and adequate ventilation will improve Outcome: Progressing   Problem: Role Relationship: Goal: Method of communication will improve Outcome: Progressing

## 2020-10-06 NOTE — Procedures (Signed)
Extubation Procedure Note  Patient Details:   Name: Atwood Adcock DOB: [birth year] MRN: [id number]   Airway Documentation:    Vent end date: 10/06/20 Vent end time: 1005   Evaluation  O2 sats: stable throughout Complications: No apparent complications Patient did tolerate procedure well. Bilateral Breath Sounds: Clear   Yes   Patient extubated to 6L nasal cannula per MD order.  Positive cuff leak noted.  No evidence of stridor.  Patient able to speak post extubation.  Sats currently 93%.  Vitals are stable.  No complications noted.   Judith Part 10/06/2020, 10:14 AM

## 2020-10-06 NOTE — Progress Notes (Signed)
[facility] Progress Note Patient Name: Dewayne Severe DOB: 1945-11-18 MRN: [id number]   Date of Service  10/06/2020  HPI/Events of Note  Oliguria - urine output down to 10 mL/hour. CVP = 4-6. LVEF = 20-25% and Hgb = 12.6. Albumin = 3.6.   eICU Interventions  Plan: 1. 25% Albumin 12.5 gm IV now.      Intervention Category Major Interventions: Other:  Dejah Droessler Cornelia Copa 10/06/2020, 1:26 AM

## 2020-10-07 DIAGNOSIS — J9600 Acute respiratory failure, unspecified whether with hypoxia or hypercapnia: Secondary | ICD-10-CM | POA: Diagnosis present

## 2020-10-07 DIAGNOSIS — N179 Acute kidney failure, unspecified: Secondary | ICD-10-CM | POA: Diagnosis not present

## 2020-10-07 DIAGNOSIS — J9601 Acute respiratory failure with hypoxia: Secondary | ICD-10-CM | POA: Diagnosis not present

## 2020-10-07 DIAGNOSIS — I2121 ST elevation (STEMI) myocardial infarction involving left circumflex coronary artery: Secondary | ICD-10-CM | POA: Diagnosis not present

## 2020-10-07 DIAGNOSIS — D62 Acute posthemorrhagic anemia: Secondary | ICD-10-CM | POA: Diagnosis not present

## 2020-10-07 DIAGNOSIS — J9602 Acute respiratory failure with hypercapnia: Secondary | ICD-10-CM

## 2020-10-07 DIAGNOSIS — I469 Cardiac arrest, cause unspecified: Secondary | ICD-10-CM | POA: Diagnosis not present

## 2020-10-07 LAB — COMPREHENSIVE METABOLIC PANEL
ALT: 98 U/L — ABNORMAL HIGH (ref 0–44)
AST: 164 U/L — ABNORMAL HIGH (ref 15–41)
Albumin: 3.1 g/dL — ABNORMAL LOW (ref 3.5–5.0)
Alkaline Phosphatase: 32 U/L — ABNORMAL LOW (ref 38–126)
Anion gap: 10 (ref 5–15)
BUN: 51 mg/dL — ABNORMAL HIGH (ref 8–23)
CO2: 20 mmol/L — ABNORMAL LOW (ref 22–32)
Calcium: 8.4 mg/dL — ABNORMAL LOW (ref 8.9–10.3)
Chloride: 110 mmol/L (ref 98–111)
Creatinine, Ser: 2.95 mg/dL — ABNORMAL HIGH (ref 0.61–1.24)
GFR, Estimated: 21 mL/min — ABNORMAL LOW (ref >60)
Glucose, Bld: 139 mg/dL — ABNORMAL HIGH (ref 70–99)
Potassium: 3.8 mmol/L (ref 3.5–5.1)
Sodium: 140 mmol/L (ref 135–145)
Total Bilirubin: 0.8 mg/dL (ref 0.3–1.2)
Total Protein: 5.8 g/dL — ABNORMAL LOW (ref 6.5–8.1)

## 2020-10-07 LAB — PHOSPHORUS: Phosphorus: 3.7 mg/dL (ref 2.5–4.6)

## 2020-10-07 LAB — GLUCOSE, CAPILLARY
Glucose-Capillary: 121 mg/dL — ABNORMAL HIGH (ref 70–99)
Glucose-Capillary: 130 mg/dL — ABNORMAL HIGH (ref 70–99)
Glucose-Capillary: 134 mg/dL — ABNORMAL HIGH (ref 70–99)
Glucose-Capillary: 138 mg/dL — ABNORMAL HIGH (ref 70–99)
Glucose-Capillary: 141 mg/dL — ABNORMAL HIGH (ref 70–99)
Glucose-Capillary: 208 mg/dL — ABNORMAL HIGH (ref 70–99)

## 2020-10-07 LAB — BLOOD GAS, ARTERIAL
Acid-base deficit: 10 mmol/L — ABNORMAL HIGH (ref 0.0–2.0)
Bicarbonate: 15.2 mmol/L — ABNORMAL LOW (ref 20.0–28.0)
FIO2: 100
O2 Saturation: 88.4 %
Patient temperature: 37.1
pCO2 arterial: 32.4 mmHg (ref 32.0–48.0)
pH, Arterial: 7.293 — ABNORMAL LOW (ref 7.350–7.450)
pO2, Arterial: 65.3 mmHg — ABNORMAL LOW (ref 83.0–108.0)

## 2020-10-07 LAB — CBC
HCT: 28.1 % — ABNORMAL LOW (ref 39.0–52.0)
HCT: 28 % — ABNORMAL LOW (ref 39.0–52.0)
Hemoglobin: 9.2 g/dL — ABNORMAL LOW (ref 13.0–17.0)
Hemoglobin: 9.3 g/dL — ABNORMAL LOW (ref 13.0–17.0)
MCH: 30.1 pg (ref 26.0–34.0)
MCH: 30.3 pg (ref 26.0–34.0)
MCHC: 32.7 g/dL (ref 30.0–36.0)
MCHC: 33.2 g/dL (ref 30.0–36.0)
MCV: 91.8 fL (ref 80.0–100.0)
MCV: 91.2 fL (ref 80.0–100.0)
Platelets: 182 10*3/uL (ref 150–400)
Platelets: 191 10*3/uL (ref 150–400)
RBC: 3.06 MIL/uL — ABNORMAL LOW (ref 4.22–5.81)
RBC: 3.07 MIL/uL — ABNORMAL LOW (ref 4.22–5.81)
RDW: 13.8 % (ref 11.5–15.5)
RDW: 13.8 % (ref 11.5–15.5)
WBC: 11.5 10*3/uL — ABNORMAL HIGH (ref 4.0–10.5)
WBC: 10.9 10*3/uL — ABNORMAL HIGH (ref 4.0–10.5)
nRBC: 0 % (ref 0.0–0.2)
nRBC: 0 % (ref 0.0–0.2)

## 2020-10-07 LAB — BASIC METABOLIC PANEL
Anion gap: 11 (ref 5–15)
BUN: 48 mg/dL — ABNORMAL HIGH (ref 8–23)
CO2: 19 mmol/L — ABNORMAL LOW (ref 22–32)
Calcium: 8.5 mg/dL — ABNORMAL LOW (ref 8.9–10.3)
Chloride: 110 mmol/L (ref 98–111)
Creatinine, Ser: 2.87 mg/dL — ABNORMAL HIGH (ref 0.61–1.24)
GFR, Estimated: 22 mL/min — ABNORMAL LOW (ref >60)
Glucose, Bld: 156 mg/dL — ABNORMAL HIGH (ref 70–99)
Potassium: 3.6 mmol/L (ref 3.5–5.1)
Sodium: 140 mmol/L (ref 135–145)

## 2020-10-07 LAB — MAGNESIUM: Magnesium: 1.9 mg/dL (ref 1.7–2.4)

## 2020-10-07 MED ORDER — ATORVASTATIN CALCIUM 80 MG PO TABS
80.0000 mg | ORAL_TABLET | Freq: Every day | ORAL | Status: DC
  Administered 2020-10-07: 80 mg via ORAL
  Filled 2020-10-07: qty 1

## 2020-10-07 MED ORDER — HALOPERIDOL LACTATE 5 MG/ML IJ SOLN
INTRAMUSCULAR | Status: AC
  Filled 2020-10-07: qty 1

## 2020-10-07 MED ORDER — INSULIN ASPART 100 UNIT/ML ~~LOC~~ SOLN
0.0000 [IU] | Freq: Three times a day (TID) | SUBCUTANEOUS | Status: DC
  Administered 2020-10-07: 1 [IU] via SUBCUTANEOUS
  Administered 2020-10-08 (×3): 2 [IU] via SUBCUTANEOUS
  Administered 2020-10-09: 1 [IU] via SUBCUTANEOUS

## 2020-10-07 MED ORDER — SODIUM BICARBONATE 8.4 % IV SOLN
50.0000 meq | Freq: Once | INTRAVENOUS | Status: AC
  Administered 2020-10-07: 50 meq via INTRAVENOUS

## 2020-10-07 MED ORDER — MAGNESIUM SULFATE IN D5W 1-5 GM/100ML-% IV SOLN
1.0000 g | Freq: Once | INTRAVENOUS | Status: AC
  Administered 2020-10-07: 1 g via INTRAVENOUS
  Filled 2020-10-07: qty 100

## 2020-10-07 MED ORDER — FUROSEMIDE 10 MG/ML IJ SOLN
40.0000 mg | Freq: Once | INTRAMUSCULAR | Status: AC
  Administered 2020-10-07: 40 mg via INTRAVENOUS
  Filled 2020-10-07: qty 4

## 2020-10-07 MED ORDER — POTASSIUM CHLORIDE 10 MEQ/50ML IV SOLN
10.0000 meq | INTRAVENOUS | Status: AC
  Administered 2020-10-07 – 2020-10-08 (×4): 10 meq via INTRAVENOUS
  Filled 2020-10-07: qty 50

## 2020-10-07 MED ORDER — ALBUTEROL SULFATE (2.5 MG/3ML) 0.083% IN NEBU
INHALATION_SOLUTION | RESPIRATORY_TRACT | Status: AC
  Administered 2020-10-07: 2.5 mg
  Filled 2020-10-07: qty 3

## 2020-10-07 MED ORDER — LORAZEPAM 2 MG/ML IJ SOLN
1.0000 mg | INTRAMUSCULAR | Status: DC | PRN
  Administered 2020-10-07 – 2020-10-21 (×14): 1 mg via INTRAVENOUS
  Filled 2020-10-07 (×18): qty 1

## 2020-10-07 MED ORDER — HALOPERIDOL LACTATE 5 MG/ML IJ SOLN
5.0000 mg | Freq: Once | INTRAMUSCULAR | Status: AC
  Administered 2020-10-07: 5 mg via INTRAVENOUS

## 2020-10-07 NOTE — Plan of Care (Signed)
  Problem: Education: Goal: Knowledge of General Education information will improve Description: Including pain rating scale, medication(s)/side effects and non-pharmacologic comfort measures Outcome: Not Progressing   

## 2020-10-07 NOTE — Evaluation (Signed)
Clinical/Bedside Swallow Evaluation Patient Details  Name: Timothy Moran MRN: [id number] Date of Birth: 1946/03/23  Today's Date: 10/07/2020 Time: SLP Start Time (ACUTE ONLY): 6578 SLP Stop Time (ACUTE ONLY): 0915 SLP Time Calculation (min) (ACUTE ONLY): 22 min  Past Medical History:  Past Medical History:  Diagnosis Date  . Abnormal echocardiogram 06/07/2019  . Arrhythmia   . Ascending aortic aneurysm (HCC) 4.2 cm based on CT from December 2020 01/29/2018   4.3 cm by echo in 2019  . Benign brain tumor ([place])   . Benign neoplasm of brain ([place]) 12/19/2017  . Chest pain 12/04/2017  . Chronic kidney disease    Stage 3  . Chronic kidney disease, stage III (moderate) ([place]) 12/19/2017  . Diabetes mellitus, type 2 ([place]) 10/06/2020   10/06/20 A1C 7%  . Dizziness 10/11/2019  . History of pulmonary embolus (PE)   . Hypertension   . Left bundle branch block 12/19/2017  . Nonsustained ventricular tachycardia ([place]) 10/11/2019  . Pain of left hip joint 05/29/2020  . Personal history of pulmonary embolism 12/19/2017  . Pulmonary hypertension due to thromboembolism ([place]) 03/09/2018   See echo 12/27/17 with nl PAS vs CTa chest 02/23/18   . Sinus bradycardia 12/22/2017  . Solitary pulmonary nodule on lung CT 03/08/2018   CT 12/04/17 1.0 x 0.8 x 0.7 cm nodular opacity in the RUL vs not seen 03/16/10 posterior segment of the right upper lobe. [place] KitchenSpirometry 03/08/2018    FEV1 3.86 (108%)  Ratio 96 s prior rx  - PET  03/13/18   Low grade c/w adenoca > rec T surgery eval     Past Surgical History:  Past Surgical History:  Procedure Laterality Date  . APPENDECTOMY    . CORONARY/GRAFT ACUTE MI REVASCULARIZATION N/A 10/04/2020   Procedure: Coronary/Graft Acute MI Revascularization;  Surgeon: Nelva Bush, MD;  Location: [facility];  Service: Cardiovascular;  Laterality: N/A;  . HERNIA REPAIR    . LEFT HEART CATH AND CORONARY ANGIOGRAPHY N/A 10/04/2020   Procedure: LEFT HEART CATH AND CORONARY ANGIOGRAPHY;   Surgeon: Nelva Bush, MD;  Location: [facility];  Service: Cardiovascular;  Laterality: N/A;   HPI:  Pt is a 75 y.o. male who presented to the ED on 1/23 after STEMI/cardiac arrest. Pt was intubated from 10/04/20 to 10/06/20. CT Head from 10/04/20 showed no acute intracranial abnormality and recent CXRs show no signs of infection. PMH of LBBB, multiple symptomatic meningiomas, CKD stage III, 2 prior pulmonary embolisms, HTN, pulmonary nodules, and bradycardia.   Assessment / Plan / Recommendation Clinical Impression  Pt demonstrated several s/s of dysphagia and potential aspiration during today's evaluation. Immediate throat clears were noted when consuming ice chips and purees. Immediate coughs were also noted throughout all trials of thin liquids. Pt also demonstrated multiple swallows when consuming purees. Recommend consumption of a few pieces of ice chips after oral care and small amounts of purees for medication administration to try to help pt's readiness for PO intake. Recomend pt remain NPO otherwise due to these signs and symtoms and will f/u for potential diet upgrade as pt recovers from intubation.   SLP Visit Diagnosis: Dysphagia, unspecified (R13.10)    Aspiration Risk  Moderate aspiration risk    Diet Recommendation NPO   Medication Administration: Crushed with puree    Other  Recommendations Oral Care Recommendations: Oral care QID   Follow up Recommendations None      Frequency and Duration min 2x/week  2 weeks  Prognosis Prognosis for Safe Diet Advancement: Fair Barriers to Reach Goals: Time post onset      Swallow Study   General HPI: Pt is a 75 y.o. male who presented to the ED on 1/23 after STEMI/cardiac arrest. Pt was intubated from 10/04/20 to 10/06/20. CT Head from 10/04/20 showed no acute intracranial abnormality and recent CXRs show no signs of infection. PMH of LBBB, multiple symptomatic meningiomas, CKD stage III, 2 prior pulmonary embolisms,  HTN, pulmonary nodules, and bradycardia. Type of Study: Bedside Swallow Evaluation Previous Swallow Assessment: Not in chart Diet Prior to this Study: NPO Temperature Spikes Noted: No Respiratory Status: Nasal cannula History of Recent Intubation: Yes Length of Intubations (days): 3 days Date extubated: 10/06/20 Behavior/Cognition: Alert;Pleasant mood;Cooperative Oral Cavity Assessment: Within Functional Limits Oral Care Completed by SLP: No Oral Cavity - Dentition: Adequate natural dentition Vision: Functional for self-feeding Self-Feeding Abilities: Able to feed self Patient Positioning: Upright in bed Baseline Vocal Quality: Hoarse Volitional Cough: Weak Volitional Swallow: Able to elicit    Oral/Motor/Sensory Function Overall Oral Motor/Sensory Function: Within functional limits   Ice Chips Ice chips: Impaired Presentation: Spoon Pharyngeal Phase Impairments: Throat Clearing - Immediate   Thin Liquid Thin Liquid: Impaired Presentation: Cup;Straw Pharyngeal  Phase Impairments: Cough - Immediate    Nectar Thick Nectar Thick Liquid: Not tested   Honey Thick Honey Thick Liquid: Not tested   Puree Puree: Impaired Presentation: Spoon Pharyngeal Phase Impairments: Multiple swallows;Throat Clearing - Immediate   Solid     Solid: Not tested     Jeanine Luz., SLP Student 10/07/2020,10:50 AM

## 2020-10-07 NOTE — Progress Notes (Signed)
   NAME:  Timothy Moran, MRN:  [id number], DOB:  1946-05-20, LOS: 3 ADMISSION DATE:  10/04/2020, CONSULTATION DATE: October 05, 2019 REFERRING MD: Cardiology, CHIEF COMPLAINT: Cardiac arrest  Brief History:  75 year old male status post V. Fib cardiac arrest witnessed by wife, shocked 4 and 2 epi.  Patient was noted to have acute inferior/inferolateral STEMI, underwent cardiac cath with stent and circumflex came up unresponsive nonpurposeful movements initiated hypothermia protocol.  Past Medical History:  History of meningioma, spinal tumor, benign lung nodules PET positive, chronic kidney disease stage IV, hypertension, hyperlipidemia, ascending aortic aneurysm and left bundle branch block  Significant Hospital Events:  1/23 cardiac cath with cardiac stent 1/24 hypothermia protocol  Consults:  Cardiology  Procedures:  1/23 cardiac cath with a stent in left circumflex 1/23 ETT > 1/25 1/23 right subclavian CVC  Objective   Blood pressure 140/86, pulse 76, temperature 100.22 F (37.9 C), resp. rate (!) 21, height 6\' 1"  (1.854 m), weight 109 kg, SpO2 93 %. CVP:  [1 mmHg-12 mmHg] 8 mmHg      Intake/Output Summary (Last 24 hours) at 10/07/2020 1044 Last data filed at 10/07/2020 1000 Gross per 24 hour  Intake 2158.87 ml  Output 1200 ml  Net 958.87 ml   Filed Weights   10/04/20 1758 10/04/20 2115 10/06/20 0500  Weight: 103 kg 104.3 kg 109 kg    General:  Elderly M, awake and in no distress HEENT: MM pink/moist Neuro: awake alert, oriented and conversational without obvious neuro deficits CV: s1s2 rrr, no m/r/g PULM:  On 2L [place], clear bilaterally GI: soft, bsx4 active  Extremities: warm/dry, no edema  Skin: no rashes or lesions    Assessment & Plan:   Acute inferior/inferior lateral ST elevation MI s/p DES. Status post V. fib cardiac arrest - s/p hypothermia protocol to 36 degrees. Hx LBB, Cardiomyopathy (EF now 20-25% on echo 1/24), HTN. P: -Cardiology managing,  considering staged PCI to LAD -continue ASA, Ticagrelor and Amiodarone  -Severely worsened EF compared to prior, 20-25%    Acute hypoxic/hypercapnic respiratory failure - s/p intubation. P: -Extubated yesterday to [place] and stable from respiratory standpoint, plan to transfer out of ICU today, PCCM will sign off as there are no current critical care needs   Acute kidney injury on CKD stage IIIa  P: -- Follow BMP  Acute blood loss anemia from oozing CVL site - improved. - Hgb stable, monitor for bleeding  Hx PE - had supposedly been prescribed APixaban x 3 months but not listed on MAR. -per cards note pt was to be anticoagulated for 3 months but self-discontinued   C-Collar in place. - now removed, cleared clinically   CRITICAL CARE Performed by: Otilio Carpen Chenee Munns   Total critical care time: 33 minutes  Critical care time was exclusive of separately billable procedures and treating other patients.  Critical care was necessary to treat or prevent imminent or life-threatening deterioration.  Critical care was time spent personally by me on the following activities: development of treatment plan with patient and/or surrogate as well as nursing, discussions with consultants, evaluation of patient's response to treatment, examination of patient, obtaining history from patient or surrogate, ordering and performing treatments and interventions, ordering and review of laboratory studies, ordering and review of radiographic studies, pulse oximetry and re-evaluation of patient's condition.  Otilio Carpen [place] Leap, PA-C [facility]  See AMION for pager details

## 2020-10-07 NOTE — Progress Notes (Addendum)
NAME:  Timothy Moran, MRN:  [id number], DOB:  September 10, 1946, LOS: 3 ADMISSION DATE:  10/04/2020, CONSULTATION DATE: October 05, 2019 REFERRING MD: Cardiology, CHIEF COMPLAINT: Cardiac arrest  Brief History:  75 year old male status post VF arrest witnessed by wife, shocked 4 and 2 epi.  Patient was noted to have acute inferior/inferolateral STEMI, underwent cardiac cath with stent deployment.  Returned from cath lab unresponsive, nonpurposeful movements. Initiated hypothermia protocol.  CTSP by Dr. Paticia Stack (Cardiology) re: altered mental status/agitation.  Patient is seen in 2C09, where he is on noninvasive positive pressure ventilatory support at the time of clinical interview.  He is awake, conversant, but he is confused.  He follows commands intermittently and is quite rambunctious, prompting the staff to apply soft restraints.  He is tachycardic (heart rate 110s), tachypneic (respiratory rate 30s).  He is normotensive.  SPO2 88-91% on 100% FiO2.  Ventilatory assistance is being provided in NIV pressure control f 15, 12+PEEP 8, FiO2 100%.  He remains on amiodarone infusion for his recent VF arrest.  2D echo (1/24) showed LVEF 20-25% with diffuse hypokinesis.  Lab Results  Component Value Date   PHART 7.293 (L) 10/07/2020   PCO2ART 32.4 10/07/2020   PO2ART 65.3 (L) 10/07/2020    Past Medical History:  History of meningioma, spinal tumor, benign lung nodules PET positive, chronic kidney disease stage IV, hypertension, hyperlipidemia, ascending aortic aneurysm and left bundle branch block  Significant Hospital Events:  1/23 cardiac cath with cardiac stent 1/24 hypothermia protocol  Consults:  Cardiology PCCM  Procedures:  1/23 cardiac cath with a stent in left circumflex 1/23 ETT > 1/25 1/23 right subclavian CVC  Objective   Blood pressure (!) 160/92, pulse 78, temperature 97.8 F (36.6 C), temperature source Oral, resp. rate 17, height 6\' 1"  (1.854 m), weight 109 kg, SpO2 93  %. CVP:  [3 mmHg-50 mmHg] 8 mmHg      Intake/Output Summary (Last 24 hours) at 10/07/2020 2330 Last data filed at 10/07/2020 1924 Gross per 24 hour  Intake 820.66 ml  Output 1945 ml  Net -1124.34 ml   Filed Weights   10/04/20 1758 10/04/20 2115 10/06/20 0500  Weight: 103 kg 104.3 kg 109 kg    General: Awake.  Appears uncomfortable, fidgeting. HEENT: MM pink/moist Neuro: Awake, alert, oriented to person.  Intermittently follows commands.  Nonlateralizing exam.   CV: s1s2 rrr, no m/r/g PULM: Diminished air entry with coarse crackles bilaterally.  No prolonged expiratory phase.  Tachypneic. Extremities: warm/dry, no edema  Skin: no rashes or lesions   Assessment & Plan:  Principal Problem:   Acute respiratory failure (HCC) Active Problems:   Left bundle branch block   STEMI (ST elevation myocardial infarction) (HCC)   SOB (shortness of breath)   Chronic kidney disease, stage III (moderate) (HCC)   Cardiac arrest with ventricular fibrillation (HCC)   AKI (acute kidney injury) ([place])   Personal history of pulmonary embolism   Hypertension   Acute blood loss anemia   Acute hypoxemic respiratory failure - s/p intubation, extubated 1/25 to [place]. P: -now requiring NIPPV support.  Titrate settings based on ABG results. -CXR -Diurese.  Will give 1 amp bicarbonate to temporize and alleviate the patient's work of breathing, as ABG shows partially compensated metabolic acidosis. -check lactate -albuterol PRN -haloperidol 5 mg IV single dose for agitation and 2.5 mg IV q 6 hr PRN.   Acute inferior/inferior lateral ST elevation MI s/p DES. Status post V. fib cardiac arrest - s/p hypothermia protocol to  36 degrees. Hx LBB, Cardiomyopathy (EF now 20-25% on echo 1/24), HTN. P: -Cardiology managing, considering staged PCI to LAD -continue ASA, Ticagrelor and Amiodarone  -Severely worsened EF compared to prior, 20-25%   Acute kidney injury on CKD stage IIIa  P: -monitor urine  output. -diurese (will give a dose of furosemide 40 mg IV now). May need to repeat dose if U/O does not increase.   Acute blood loss anemia from oozing CVL site - improved. - Hgb stable, monitor for bleeding  Hx PE - had supposedly been prescribed apixaban x 3 months but not listed on MAR. -per cards note pt was to be anticoagulated for 3 months but self-discontinued    C-Collar in place. - now removed, cleared clinically   Critical care time: 60 minutes.  The treatment and management of the patient's condition was required based on the threat of imminent deterioration. This time reflects time spent by the physician evaluating, providing care and managing the critically ill patient's care. The time was spent at the immediate bedside (or on the same floor/unit and dedicated to this patient's care). Time involved in separately billable procedures is NOT included int he critical care time indicated above. Family meeting and update time may be included above if and only if the patient is unable/incompetent to participate in clinical interview and/or decision making, and the discussion was necessary to determining treatment decisions.  Renee Pain, MD Board Certified by the ABIM, [place]

## 2020-10-07 NOTE — Progress Notes (Signed)
RT NOTE:   RT called to bedside for desaturation to low 70's on NRB. Upon arrival, pt in distress w/ increased WOB, coarse crackles throughout. NTS done with moderate, tan plugs cleared. Pt placed on BIPAP 20/8 w/ some improvement . SpO2 slowly increased to mid 80's. ABG collected and reported to CCM MD @ bedside. BIPAP settings adjusted & albuterol treatment given. MD at bedside. Pt to remain on BIPAP.

## 2020-10-07 NOTE — Progress Notes (Signed)
Progress Note  Patient Name: Timothy Moran Date of Encounter: 10/07/2020  [facility] Cardiologist: Jenne Campus, MD  Subjective   Extubated yesterday. Awake, alert, appropriate this AM. No pain in neck and no limited range of motion. Reviewed events to day. Planning for swallow study today.  Inpatient Medications    Scheduled Meds: . aspirin  81 mg Oral Daily  . busPIRone  15 mg Oral TID  . chlorhexidine gluconate (MEDLINE KIT)  15 mL Mouth Rinse BID  . Chlorhexidine Gluconate Cloth  6 each Topical Daily  . enoxaparin (LOVENOX) injection  30 mg Subcutaneous Q24H  . feeding supplement (PROSource TF)  90 mL Per Tube TID  . insulin aspart  0-9 Units Subcutaneous Q4H  . pantoprazole (PROTONIX) IV  40 mg Intravenous QHS  . rosuvastatin  20 mg Oral Daily  . sodium chloride flush  3 mL Intravenous Q12H  . Thrombi-Pad  1 each Topical Once  . ticagrelor  90 mg Oral BID   Continuous Infusions: . sodium chloride 20 mL/hr at 10/07/20 0800  . amiodarone 30 mg/hr (10/07/20 0800)  . cangrelor 50 mg in NS 250 mL 0.75 mcg/kg/min (10/07/20 0800)  . feeding supplement (VITAL 1.5 CAL) Stopped (10/06/20 0744)  . sodium chloride     PRN Meds: sodium chloride, acetaminophen, acetaminophen, ondansetron (ZOFRAN) IV, sodium chloride flush   Vital Signs    Vitals:   10/07/20 0500 10/07/20 0600 10/07/20 0700 10/07/20 0800  BP:  136/82  (!) 157/83  Pulse: 74 71 75 78  Resp: (!) 23 (!) 41 20 (!) 22  Temp: 100.22 F (37.9 C) 100.04 F (37.8 C) 99.86 F (37.7 C) 99.86 F (37.7 C)  TempSrc:      SpO2: 94% 94% 95% 92%  Weight:      Height:        Intake/Output Summary (Last 24 hours) at 10/07/2020 0903 Last data filed at 10/07/2020 0800 Gross per 24 hour  Intake 2036.93 ml  Output 1025 ml  Net 1011.93 ml   Last 3 Weights 10/06/2020 10/04/2020 10/04/2020  Weight (lbs) 240 lb 4.8 oz 229 lb 15 oz 227 lb 1.2 oz  Weight (kg) 109 kg 104.3 kg 103 kg      Telemetry    Sinus  bradycardia/sinus rhythm with intermittent PVCs- Personally Reviewed  ECG    1/24 SR, 1st degree AV bock, LBBB, ST elevations resolved- Personally Reviewed  Physical Exam   GEN: Well nourished, well developed in no acute distress. Alert and oriented. NECK: No JVD CARDIAC: regular rhythm, normal S1 and S2, no rubs or gallops. No murmur. L subclavian line c/d/i VASCULAR: Radial pulses 2+ bilaterally.  RESPIRATORY:  Clear to auscultation without rales, wheezing or rhonchi  ABDOMEN: Soft, non-tender, non-distended MUSCULOSKELETAL:  Moves all 4 limbs independently SKIN: Warm and dry, no edema NEUROLOGIC:  No focal neuro deficits noted. PSYCHIATRIC:  Normal affect   Labs    High Sensitivity Troponin:   Recent Labs  Lab 10/04/20 1808 10/04/20 2324 10/05/20 0500  TROPONINIHS 75* >27,000* >27,000*      Chemistry Recent Labs  Lab 10/04/20 1808 10/04/20 1821 10/05/20 0500 10/06/20 0317 10/07/20 0409  NA 139   < > 137 137 140  K 4.3   < > 5.1 4.9 3.8  CL 107   < > 107 107 110  CO2 19*   < > 20* 20* 20*  GLUCOSE 217*   < > 261* 190* 139*  BUN 20   < > 25* 40*  51*  CREATININE 1.86*   < > 1.66* 2.63* 2.95*  CALCIUM 8.6*   < > 8.0* 8.2* 8.4*  PROT 6.9  --   --  5.8* 5.8*  ALBUMIN 3.6  --   --  3.2* 3.1*  AST 189*  --   --  246* 164*  ALT 165*  --   --  138* 98*  ALKPHOS 55  --   --  34* 32*  BILITOT 0.7  --   --  0.6 0.8  GFRNONAA 37*   < > 42* 24* 21*  ANIONGAP 13   < > $R'10 10 10   'wx$ < > = values in this interval not displayed.     Hematology Recent Labs  Lab 10/05/20 0500 10/06/20 0317 10/07/20 0409  WBC 14.2* 12.8* 11.5*  RBC 4.14* 3.53* 3.06*  HGB 12.6* 11.0* 9.2*  HCT 38.5* 32.8* 28.1*  MCV 93.0 92.9 91.8  MCH 30.4 31.2 30.1  MCHC 32.7 33.5 32.7  RDW 13.5 14.0 13.8  PLT 241 191 182    BNP Recent Labs  Lab 10/04/20 1809  BNP 51.6     DDimer No results for input(s): DDIMER in the last 168 hours.   Radiology    EEG adult  Result Date:  10/05/2020 Lora Havens, MD     10/06/2020  8:48 AM Patient Name: Timothy Moran MRN: [id number] Epilepsy Attending: Lora Havens Referring Physician/Provider: Dr Rollene Fare Date: 10/05/2020 Duration: 25.56 minutes Patient history: 75 year old man status post cardiac arrest.  EEG to evaluate for seizures. Level of alertness: Comatose AEDs during EEG study: Versed Technical aspects: This EEG study was done with scalp electrodes positioned according to the 10-20 International system of electrode placement. Electrical activity was acquired at a sampling rate of $Remov'500Hz'jotVlA$  and reviewed with a high frequency filter of $RemoveB'70Hz'sOMpFrEC$  and a low frequency filter of $RemoveB'1Hz'xaQPCYhD$ . EEG data were recorded continuously and digitally stored. Description: EEG showed continuous generalized 3 to 5 Hz theta- delta slowing. EEG was reactive to noxious stimulation.  Hyperventilation and photic stimulation were not performed.   ABNORMALITY -Continuous slow, generalized IMPRESSION: This study is suggestive of severe diffuse encephalopathy, nonspecific etiology.  No seizures or epileptiform discharges were seen throughout the recording. Priyanka [name]   Overnight EEG with video  Result Date: 10/06/2020 Lora Havens, MD     10/06/2020 10:24 AM Patient Name: Timothy Moran MRN: [id number] Epilepsy Attending: Lora Havens Referring Physician/Provider: Dr Rollene Fare Duration: 10/05/2020 7829 to 10/06/2020 5621  Patient history: 75 year old man status post cardiac arrest.  EEG to evaluate for seizures.  Level of alertness: Comatose  AEDs during EEG study: Versed  Technical aspects: This EEG study was done with scalp electrodes positioned according to the 10-20 International system of electrode placement. Electrical activity was acquired at a sampling rate of $Remov'500Hz'sEPiam$  and reviewed with a high frequency filter of $RemoveB'70Hz'XScgHSUJ$  and a low frequency filter of $RemoveB'1Hz'UEMIUxpN$ . EEG data were recorded continuously and digitally stored.  Description: EEG showed  continuous generalized 3 to 5 Hz theta- delta slowing. EEG was reactive to noxious stimulation.  Hyperventilation and photic stimulation were not performed.    ABNORMALITY -Continuous slow, generalized  IMPRESSION: This study is suggestive of severe diffuse encephalopathy, nonspecific etiology.  No seizures or epileptiform discharges were seen throughout the recording.  Lora Havens   ECHOCARDIOGRAM COMPLETE  Result Date: 10/05/2020    ECHOCARDIOGRAM REPORT   Patient Name:   VERSHAWN WESTRUP Date of Exam: 10/05/2020 Medical Rec #:  [id number]          Height:       73.0 in Accession #:    [id number]         Weight:       229.9 lb Date of Birth:  [birth year]           BSA:          2.283 m Patient Age:    75 years           BP:           121/73 mmHg Patient Gender: M                  HR:           61 bpm. Exam Location:  Inpatient Procedure: 2D Echo Indications:    cardiac arrest  History:        Patient has prior history of Echocardiogram examinations, most                 recent 06/19/2020. Chronic kidney disease, Arrythmias:LBBB and                 Ventricular Fibrillation; Signs/Symptoms:Shortness of Breath.  Sonographer:    Johny Chess Referring Phys: [place]  1. Since the study on 06/19/2020 LVEF has decreased, now severely impaired at 20-25% and diffuse hypokinesis.  2. Left ventricular ejection fraction, by estimation, is 20 to 25%. The left ventricle has severely decreased function. The left ventricle demonstrates global hypokinesis. There is mild concentric left ventricular hypertrophy. Left ventricular diastolic  parameters are consistent with Grade I diastolic dysfunction (impaired relaxation).  3. Right ventricular systolic function is normal. The right ventricular size is normal.  4. The mitral valve is normal in structure. Mild mitral valve regurgitation. No evidence of mitral stenosis.  5. The aortic valve is normal in structure. Aortic valve regurgitation is  trivial. No aortic stenosis is present.  6. The inferior vena cava is normal in size with greater than 50% respiratory variability, suggesting right atrial pressure of 3 mmHg. FINDINGS  Left Ventricle: Left ventricular ejection fraction, by estimation, is 20 to 25%. The left ventricle has severely decreased function. The left ventricle demonstrates global hypokinesis. The left ventricular internal cavity size was normal in size. There is mild concentric left ventricular hypertrophy. Abnormal (paradoxical) septal motion, consistent with left bundle branch block. Left ventricular diastolic function could not be evaluated due to atrial fibrillation. Left ventricular diastolic parameters are consistent with Grade I diastolic dysfunction (impaired relaxation). Normal left ventricular filling pressure. Right Ventricle: The right ventricular size is normal. No increase in right ventricular wall thickness. Right ventricular systolic function is normal. Left Atrium: Left atrial size was normal in size. Right Atrium: Right atrial size was normal in size. Pericardium: There is no evidence of pericardial effusion. Mitral Valve: The mitral valve is normal in structure. Mild mitral valve regurgitation. No evidence of mitral valve stenosis. Tricuspid Valve: The tricuspid valve is normal in structure. Tricuspid valve regurgitation is not demonstrated. No evidence of tricuspid stenosis. Aortic Valve: The aortic valve is normal in structure. Aortic valve regurgitation is trivial. No aortic stenosis is present. Pulmonic Valve: The pulmonic valve was normal in structure. Pulmonic valve regurgitation is not visualized. No evidence of pulmonic stenosis. Aorta: The aortic root is normal in size and structure. Venous: The inferior vena cava is normal in size with greater than 50% respiratory variability, suggesting right atrial pressure of 3 mmHg. IAS/Shunts:  No atrial level shunt detected by color flow Doppler.  LEFT VENTRICLE PLAX 2D  LVIDd:         5.20 cm  Diastology LVIDs:         4.40 cm  LV e' medial:    5.22 cm/s LV PW:         1.20 cm  LV E/e' medial:  9.6 LV IVS:        1.20 cm  LV e' lateral:   4.03 cm/s LVOT diam:     2.50 cm  LV E/e' lateral: 12.4 LV SV:         81 LV SV Index:   36 LVOT Area:     4.91 cm  RIGHT VENTRICLE RV S prime:     18.40 cm/s TAPSE (M-mode): 1.9 cm LEFT ATRIUM             Index LA diam:        3.80 cm 1.66 cm/m LA Vol (A2C):   69.3 ml 30.35 ml/m LA Vol (A4C):   51.9 ml 22.73 ml/m LA Biplane Vol: 60.0 ml 26.28 ml/m  AORTIC VALVE LVOT Vmax:   78.30 cm/s LVOT Vmean:  50.400 cm/s LVOT VTI:    0.166 m  AORTA Ao Asc diam: 3.80 cm MITRAL VALVE MV Area (PHT): 2.87 cm    SHUNTS MV Decel Time: 264 msec    Systemic VTI:  0.17 m MV E velocity: 50.10 cm/s  Systemic Diam: 2.50 cm MV A velocity: 83.60 cm/s MV E/A ratio:  0.60 Ena Dawley MD Electronically signed by Ena Dawley MD Signature Date/Time: 10/05/2020/11:54:46 AM    Final     Cardiac Studies   Cath 10/04/20 Conclusions: 1. Significant two-vessel coronary artery disease including thrombotic occlusion of the distal LCx, which is the culprit for the patient's inferior STEMI as well as sequential 50% and 80% proximal/mid LAD lesions. 2. Mildly elevated left ventricular filling pressure (LVEDP 20-25 mmHg). 3. Successful PCI to distal LCx using Resolute Onyx 2.5 x 30 mm drug-eluting stent complicated by no reflow treated with intracoronary adenosine and nitroglycerin, as well as aggressive antiplatelet therapy. Final angiogram shows 0% residual stenosis with TIMI-2 flow.  Recommendations: 1. Admit to 2H-ICU for post STEMI/cardiac arrest monitoring. Consider targeted temperature management; critical care medicine has been consulted to assist with targeted temperature and ventilator management. 2. Continue tirofiban infusion for 6 hours. 3. Loaded with ticagrelor when patient arrives in ICU; patient should complete 12 months of dual antiplatelet  therapy with aspirin and ticagrelor. 4. Obtain echocardiogram. 5. Aggressive secondary prevention of coronary artery disease including high intensity statin therapy. 6. Continue IV amiodarone overnight. 7. Consider staged PCI to LAD.  Echocardiogram 10/05/20 1. Since the study on 06/19/2020 LVEF has decreased, now severely impaired  at 20-25% and diffuse hypokinesis.  2. Left ventricular ejection fraction, by estimation, is 20 to 25%. The  left ventricle has severely decreased function. The left ventricle  demonstrates global hypokinesis. There is mild concentric left ventricular  hypertrophy. Left ventricular diastolic  parameters are consistent with Grade I diastolic dysfunction (impaired  relaxation).  3. Right ventricular systolic function is normal. The right ventricular  size is normal.  4. The mitral valve is normal in structure. Mild mitral valve  regurgitation. No evidence of mitral stenosis.  5. The aortic valve is normal in structure. Aortic valve regurgitation is  trivial. No aortic stenosis is present.  6. The inferior vena cava is normal in size with greater than 50%  respiratory variability, suggesting right atrial pressure of 3 mmHg.   Patient Profile     75 y.o. male with PMH LBBB, cardiomyopathy (EF 40-45%), TAA, multiple meningiomas, stage 3 chronic kidney disease, prior PE (x2, reported as provoked), hypertension who presented 10/04/20 as a VF cardiac arrest with inferior STEMI  Assessment & Plan    Inferior/inferolateral STEMI, complicated by cardiac arrest/VF as initial rhythm -s/p urgent PCI of distal LCx. Initial no-reflow, but improved with nitro, adenosine, and aggrastat -echo now with EF 20-25% -continue aspirin, ticagrelor. Have to use cangrelor until cleared for swallow, ideally can change back to ticagrelor later today -will consider staged PCI to LAD this admission, though need kidney function to improve before this can be done safely -he is  intermittently bradycardic, holding on beta blocker but may be able to trial tomorrow -peak hsTn 27,000. Initial was 75 -on rosuvastatin currently. LFTs now downtrending, continue statin -lipids show LDL 42. A1c 7.0, consistent with diabetes. -given VF arrest, remains on amiodarone. IV for now, can transition to oral once cleared to swallow  Acute kidney injury on chronic kidney disease stage 3: -presented Cr 1.86, initially down but now up to 2.93 today. Prior was 1.38 -CVP 7 this AM, urine output improving -checked coox 1/25, 64%. Suggests that he is not low output. -continue to monitor  Acute blood loss anemia -severe oozing 1/24 at site of central line, now resolved -Hgb 13.9 -> 12.6 -> 11.0 but now 9.2 today. He did receive fluids yesterday. No evidence of bleeding currently, but he is on cangrelor -monitoring closely, will repeat CBC this PM  History of prior pulmonary embolisms -pre Dr. Wendy Poet note 08/03/20, plan for 3 mons of anticoagulation with apixaban. However, I do not see this on his medicine list. Per patient's wife, he self-discontinued this.   LBBB -chronic  Cardiomyopathy, EF 40-45% prior to cardiac arrest -Echo 10/05/20 now 20-25% -was on valsartan as an outpatient -will add guideline directed medical therapy as clinical condition allows -now with diagnosis of diabetes, consider SGLT2i when more clinically stable -Coox 1/25 stable at 64%, does not suggest low output, similar to reading at cath 1/23  Hypertension: -has been labile. Will monitor. Will add back medications as renal function and blood pressure allow  Resolved issues: C-collar -placed at time of arrest. Patient removed collar overnight 1/25, clinically cleared today to keep c-collar off.  Acute hypoxic/hypercapnic respiratory failure -extubated 10/06/20  Targeted temperature management -now complete  Once he is cleared to swallow, can transition to oral medications and transfer to the  floor. Would not transfer to the floor while on cangrelor if possible.  For questions or updates, please contact [place] Please consult [email or web address] for contact info under     Signed, Buford Dresser, MD  10/07/2020, 9:03 AM

## 2020-10-07 NOTE — Progress Notes (Signed)
Upon entering pt's room, C-collar on the floor next to bed and pt's oxygen was off. Reapplied nasal cannula, but pt refused to allow this RN to re-apply the C-collar despite extensive education about it's importance. Pt able to move all extremities equally at this time. Pt has been delirious and agitated all night. Now laying calmly in bed.  Will continue to monitor and promote sleep.

## 2020-10-08 ENCOUNTER — Inpatient Hospital Stay (HOSPITAL_COMMUNITY): Payer: Medicare Other

## 2020-10-08 DIAGNOSIS — J9601 Acute respiratory failure with hypoxia: Secondary | ICD-10-CM | POA: Diagnosis not present

## 2020-10-08 DIAGNOSIS — I469 Cardiac arrest, cause unspecified: Secondary | ICD-10-CM | POA: Diagnosis not present

## 2020-10-08 DIAGNOSIS — J189 Pneumonia, unspecified organism: Secondary | ICD-10-CM | POA: Diagnosis not present

## 2020-10-08 DIAGNOSIS — N179 Acute kidney failure, unspecified: Secondary | ICD-10-CM | POA: Diagnosis not present

## 2020-10-08 DIAGNOSIS — D62 Acute posthemorrhagic anemia: Secondary | ICD-10-CM | POA: Diagnosis not present

## 2020-10-08 DIAGNOSIS — E872 Acidosis: Secondary | ICD-10-CM

## 2020-10-08 LAB — BASIC METABOLIC PANEL
Anion gap: 14 (ref 5–15)
BUN: 47 mg/dL — ABNORMAL HIGH (ref 8–23)
CO2: 19 mmol/L — ABNORMAL LOW (ref 22–32)
Calcium: 8.3 mg/dL — ABNORMAL LOW (ref 8.9–10.3)
Chloride: 112 mmol/L — ABNORMAL HIGH (ref 98–111)
Creatinine, Ser: 2.71 mg/dL — ABNORMAL HIGH (ref 0.61–1.24)
GFR, Estimated: 24 mL/min — ABNORMAL LOW (ref >60)
Glucose, Bld: 149 mg/dL — ABNORMAL HIGH (ref 70–99)
Potassium: 3.6 mmol/L (ref 3.5–5.1)
Sodium: 145 mmol/L (ref 135–145)

## 2020-10-08 LAB — URINALYSIS, COMPLETE (UACMP) WITH MICROSCOPIC
Bilirubin Urine: NEGATIVE
Glucose, UA: NEGATIVE mg/dL
Ketones, ur: NEGATIVE mg/dL
Leukocytes,Ua: NEGATIVE
Nitrite: NEGATIVE
Protein, ur: 30 mg/dL — AB
RBC / HPF: >50 RBC/hpf — ABNORMAL HIGH (ref 0–5)
Specific Gravity, Urine: 1.013 (ref 1.005–1.030)
pH: 5 (ref 5.0–8.0)

## 2020-10-08 LAB — COMPREHENSIVE METABOLIC PANEL
ALT: 83 U/L — ABNORMAL HIGH (ref 0–44)
AST: 107 U/L — ABNORMAL HIGH (ref 15–41)
Albumin: 3.1 g/dL — ABNORMAL LOW (ref 3.5–5.0)
Alkaline Phosphatase: 45 U/L (ref 38–126)
Anion gap: 14 (ref 5–15)
BUN: 48 mg/dL — ABNORMAL HIGH (ref 8–23)
CO2: 18 mmol/L — ABNORMAL LOW (ref 22–32)
Calcium: 8.5 mg/dL — ABNORMAL LOW (ref 8.9–10.3)
Chloride: 108 mmol/L (ref 98–111)
Creatinine, Ser: 2.97 mg/dL — ABNORMAL HIGH (ref 0.61–1.24)
GFR, Estimated: 21 mL/min — ABNORMAL LOW (ref >60)
Glucose, Bld: 234 mg/dL — ABNORMAL HIGH (ref 70–99)
Potassium: 3.9 mmol/L (ref 3.5–5.1)
Sodium: 140 mmol/L (ref 135–145)
Total Bilirubin: 1.3 mg/dL — ABNORMAL HIGH (ref 0.3–1.2)
Total Protein: 6.2 g/dL — ABNORMAL LOW (ref 6.5–8.1)

## 2020-10-08 LAB — BLOOD GAS, ARTERIAL
Acid-base deficit: 3.9 mmol/L — ABNORMAL HIGH (ref 0.0–2.0)
Bicarbonate: 19.2 mmol/L — ABNORMAL LOW (ref 20.0–28.0)
Drawn by: 51185
FIO2: 80
O2 Saturation: 95.5 %
Patient temperature: 37
pCO2 arterial: 26.7 mmHg — ABNORMAL LOW (ref 32.0–48.0)
pH, Arterial: 7.469 — ABNORMAL HIGH (ref 7.350–7.450)
pO2, Arterial: 76.2 mmHg — ABNORMAL LOW (ref 83.0–108.0)

## 2020-10-08 LAB — CBC
HCT: 32.5 % — ABNORMAL LOW (ref 39.0–52.0)
HCT: 30.4 % — ABNORMAL LOW (ref 39.0–52.0)
Hemoglobin: 11.2 g/dL — ABNORMAL LOW (ref 13.0–17.0)
Hemoglobin: 10.3 g/dL — ABNORMAL LOW (ref 13.0–17.0)
MCH: 30.7 pg (ref 26.0–34.0)
MCH: 30.7 pg (ref 26.0–34.0)
MCHC: 34.5 g/dL (ref 30.0–36.0)
MCHC: 33.9 g/dL (ref 30.0–36.0)
MCV: 89 fL (ref 80.0–100.0)
MCV: 90.5 fL (ref 80.0–100.0)
Platelets: 225 10*3/uL (ref 150–400)
Platelets: 196 10*3/uL (ref 150–400)
RBC: 3.65 MIL/uL — ABNORMAL LOW (ref 4.22–5.81)
RBC: 3.36 MIL/uL — ABNORMAL LOW (ref 4.22–5.81)
RDW: 14 % (ref 11.5–15.5)
RDW: 14 % (ref 11.5–15.5)
WBC: 15.3 10*3/uL — ABNORMAL HIGH (ref 4.0–10.5)
WBC: 13.9 10*3/uL — ABNORMAL HIGH (ref 4.0–10.5)
nRBC: 0 % (ref 0.0–0.2)
nRBC: 0 % (ref 0.0–0.2)

## 2020-10-08 LAB — MRSA PCR SCREENING: MRSA by PCR: NEGATIVE

## 2020-10-08 LAB — COOXEMETRY PANEL
Carboxyhemoglobin: 0.8 % (ref 0.5–1.5)
Methemoglobin: 1 % (ref 0.0–1.5)
O2 Saturation: 62.3 %
Total hemoglobin: 10.8 g/dL — ABNORMAL LOW (ref 12.0–16.0)

## 2020-10-08 LAB — LACTIC ACID, PLASMA
Lactic Acid, Venous: 2.5 mmol/L (ref 0.5–1.9)
Lactic Acid, Venous: 2.9 mmol/L (ref 0.5–1.9)

## 2020-10-08 LAB — GLUCOSE, CAPILLARY
Glucose-Capillary: 221 mg/dL — ABNORMAL HIGH (ref 70–99)
Glucose-Capillary: 196 mg/dL — ABNORMAL HIGH (ref 70–99)
Glucose-Capillary: 191 mg/dL — ABNORMAL HIGH (ref 70–99)
Glucose-Capillary: 147 mg/dL — ABNORMAL HIGH (ref 70–99)

## 2020-10-08 LAB — MAGNESIUM: Magnesium: 2.1 mg/dL (ref 1.7–2.4)

## 2020-10-08 LAB — BRAIN NATRIURETIC PEPTIDE: B Natriuretic Peptide: 1434 pg/mL — ABNORMAL HIGH (ref 0.0–100.0)

## 2020-10-08 LAB — PROCALCITONIN: Procalcitonin: 4.16 ng/mL

## 2020-10-08 MED ORDER — VANCOMYCIN HCL 1250 MG/250ML IV SOLN: 1250.0000 mg | INTRAVENOUS | Status: DC

## 2020-10-08 MED ORDER — DEXMEDETOMIDINE HCL IN NACL 400 MCG/100ML IV SOLN
0.4000 ug/kg/h | INTRAVENOUS | Status: DC
  Administered 2020-10-08: 0.8 ug/kg/h via INTRAVENOUS
  Administered 2020-10-08: 20:00:00 0.4 ug/kg/h via INTRAVENOUS
  Administered 2020-10-09 (×2): 1 ug/kg/h via INTRAVENOUS
  Administered 2020-10-09: 0.7 ug/kg/h via INTRAVENOUS
  Administered 2020-10-09: 16:00:00 0.6 ug/kg/h via INTRAVENOUS
  Administered 2020-10-09: 1.2 ug/kg/h via INTRAVENOUS
  Administered 2020-10-10: 0.7 ug/kg/h via INTRAVENOUS
  Administered 2020-10-10: 02:00:00 1.2 ug/kg/h via INTRAVENOUS
  Administered 2020-10-10: 0.4 ug/kg/h via INTRAVENOUS
  Administered 2020-10-10 – 2020-10-11 (×3): 0.6 ug/kg/h via INTRAVENOUS
  Administered 2020-10-11: 0.4 ug/kg/h via INTRAVENOUS
  Administered 2020-10-12: 0.9 ug/kg/h via INTRAVENOUS
  Administered 2020-10-12: 0.6 ug/kg/h via INTRAVENOUS
  Administered 2020-10-13: 21:00:00 0.4 ug/kg/h via INTRAVENOUS
  Administered 2020-10-13: 23:00:00 1.2 ug/kg/h via INTRAVENOUS
  Administered 2020-10-14 (×2): 1 ug/kg/h via INTRAVENOUS
  Filled 2020-10-08 (×20): qty 100

## 2020-10-08 MED ORDER — SODIUM CHLORIDE 0.9 % IV SOLN: 2.0000 g | Freq: Two times a day (BID) | INTRAVENOUS | Status: DC

## 2020-10-08 MED ORDER — HALOPERIDOL LACTATE 5 MG/ML IJ SOLN
2.0000 mg | Freq: Four times a day (QID) | INTRAMUSCULAR | Status: DC | PRN
  Administered 2020-10-08: 2 mg via INTRAVENOUS
  Filled 2020-10-08: qty 1

## 2020-10-08 MED ORDER — SODIUM CHLORIDE 0.9 % IV SOLN
0.7500 ug/kg/min | INTRAVENOUS | Status: AC
  Administered 2020-10-08 – 2020-10-09 (×4): 0.75 ug/kg/min via INTRAVENOUS
  Filled 2020-10-08 (×6): qty 50

## 2020-10-08 MED ORDER — SODIUM CHLORIDE 0.9 % IV SOLN
2.0000 g | INTRAVENOUS | Status: DC
  Administered 2020-10-08 – 2020-10-13 (×6): 2 g via INTRAVENOUS
  Filled 2020-10-08 (×6): qty 2

## 2020-10-08 MED ORDER — VANCOMYCIN HCL 2000 MG/400ML IV SOLN
2000.0000 mg | Freq: Once | INTRAVENOUS | Status: AC
  Administered 2020-10-08: 2000 mg via INTRAVENOUS
  Filled 2020-10-08: qty 400

## 2020-10-08 MED ORDER — OLANZAPINE 10 MG IM SOLR
5.0000 mg | Freq: Once | INTRAMUSCULAR | Status: AC
  Administered 2020-10-08: 5 mg via INTRAMUSCULAR
  Filled 2020-10-08: qty 10

## 2020-10-08 MED ORDER — FENTANYL CITRATE (PF) 100 MCG/2ML IJ SOLN
12.5000 ug | INTRAMUSCULAR | Status: DC | PRN
  Administered 2020-10-08 – 2020-10-10 (×3): 12.5 ug via INTRAVENOUS
  Filled 2020-10-08 (×3): qty 2

## 2020-10-08 NOTE — Progress Notes (Addendum)
OT Cancellation Note  Patient Details Name: Timothy Moran MRN: [id number] DOB: January 09, 1946   Cancelled Treatment:    Reason Eval/Treat Not Completed: Medical issues which prohibited therapy. Pt currently on biPAP, agitation overnight. Plan to reattempt at a later time/date.  Tyrone Schimke, OT Acute Rehabilitation Services Pager: [phone] Office: [phone]  10/08/2020, 9:18 AM

## 2020-10-08 NOTE — Progress Notes (Signed)
[place] Progress Note Patient Name: Timothy Moran DOB: June 02, 1946 MRN: [id number]   Date of Service  10/08/2020  HPI/Events of Note  Patient is s/p cardiac arrest and hypothermia protocol, transferred back to the ICU for congestive heart failure resulting in respiratory compromise, has also experienced delirium.  eICU Interventions  Monitor patient closely.        Kerry Kass [facility] 10/08/2020, 8:05 PM

## 2020-10-08 NOTE — Progress Notes (Signed)
RT NOTE: RT trialed patient off bipap with salter [place] and then NRB. Pt's sats dropped into the low 80's with increased WOB and agitation. RT placed patient back on bipap with previous settings. Sats improved to mid 90's. Patient resting comfortably at this time. VSS. RT will continue to monitor.

## 2020-10-08 NOTE — Progress Notes (Signed)
1500: Mickel Baas, critical care PA trailed pt off bipap with salter [place].   1515:Pt's sats dropped to 85% with increase RR, and agitation. RN placed pt back on Bipap with previous settings. Notified provider. Will continue to monitor pt.

## 2020-10-08 NOTE — Progress Notes (Signed)
SLP Cancellation Note  Patient Details Name: Mackson Botz MRN: [id number] DOB: Oct 07, 1945   Cancelled treatment:       Reason Eval/Treat Not Completed: Medical issues which prohibited therapy/ Pt on BiPAP this morning. Per chart, he had increased confusion and congestion overnight with multiple attempts at NTS by RN. Will hold PO trials today but continue to follow.     Osie Bond., M.A. [place] Acute Rehabilitation Services Pager [phone] Office [phone]  10/08/2020, 8:46 AM

## 2020-10-08 NOTE — Progress Notes (Signed)
Pharmacy Antibiotic Note  Timothy Moran is a 75 y.o. male with possible pneumonia.  Pharmacy has been consulted for vancomycin and cefepime dosing. -WBC= 15.3, tmax= 101, CXR consistent with pneumonia -SCr= 2.97 (baseline ~ 1.3-1.5), CrCL ~ 30, UOP ~ 2.5L  Plan: -Vancomycin 2000mg  IV x1 follow by 1250mg  IV q48hr -Cefepime 2gm IV q24h -Will follow renal function and clinical progress   Height: 6\' 1"  (185.4 cm) Weight: 109 kg (240 lb 4.8 oz) IBW/kg (Calculated) : 79.9  Temp (24hrs), Avg:99.4 F (37.4 C), Min:97.8 F (36.6 C), Max:101 F (38.3 C)  Recent Labs  Lab 10/05/20 0500 10/06/20 0317 10/07/20 0409 10/07/20 1224 10/07/20 2337 10/08/20 0500 10/08/20 0520  WBC 14.2* 12.8* 11.5* 10.9*  --  15.3*  --   CREATININE 1.66* 2.63* 2.95* 2.87*  --  2.97*  --   LATICACIDVEN  --   --   --   --  2.9*  --  2.5*    Estimated Creatinine Clearance: 27.4 mL/min (A) (by C-G formula based on SCr of 2.97 mg/dL (H)).    No Known Allergies  Antimicrobials this admission: 1/27 vanc 1/27 cefepime  Dose adjustments this admission:   Microbiology results: none  Thank you for allowing pharmacy to be a part of this patient's care.  Hildred Laser, PharmD Clinical Pharmacist **Pharmacist phone directory can now be found on [email or web address] ([email or web address]).  Listed under [place].

## 2020-10-08 NOTE — Progress Notes (Signed)
Patient agitated and thrashing about when his pain medications wears off.   His oxgen immediately drops into the low 80s with movement.  Administered 1mg  ativan.  Patient's more calm oxygen is recovering 91%

## 2020-10-08 NOTE — Progress Notes (Signed)
PT Cancellation Note  Patient Details Name: Timothy Moran MRN: [id number] DOB: 1946/01/08   Cancelled Treatment:    Reason Eval/Treat Not Completed: Medical issues which prohibited therapy Pt currently on biPAP, agitation overnight. Plan to reattempt at a later time/date.  Lyanne Co, DPT Acute Rehabilitation Services [phone]   Timothy Moran 10/08/2020, 9:21 AM

## 2020-10-08 NOTE — Progress Notes (Signed)
Inpatient Diabetes Program Recommendations  AACE/ADA: New Consensus Statement on Inpatient Glycemic Control (2015)  Target Ranges:  Prepandial:   less than 140 mg/dL      Peak postprandial:   less than 180 mg/dL (1-2 hours)      Critically ill patients:  140 - 180 mg/dL   Lab Results  Component Value Date   GLUCAP 196 (H) 10/08/2020   HGBA1C 7.0 (H) 10/06/2020    Review of Glycemic Control  Diabetes history:  DM2 Outpatient Diabetes medications:  Current orders for Inpatient glycemic control: Novolog 0-9 units TID with meals  Inpatient Diabetes Program Recommendations:     Change Novolog to 0-9 units Q4H Add Lantus 10 units Q24H  If TFs begin, add Novolog 3 units Q4H.  Continue to follow.  Thank you. Lorenda Peck, RD, LDN, CDE Inpatient Diabetes Coordinator [phone]

## 2020-10-08 NOTE — Progress Notes (Addendum)
NAME:  Timothy Moran, MRN:  [id number], DOB:  1945/10/14, LOS: 4 ADMISSION DATE:  10/04/2020, CONSULTATION DATE: October 05, 2019 REFERRING MD: Cardiology, CHIEF COMPLAINT: Cardiac arrest  Brief History:  75 year old male status post V. Fib cardiac arrest witnessed by wife, shocked 4 and 2 epi.  Patient was noted to have acute inferior/inferolateral STEMI, underwent cardiac cath with stent and circumflex came up unresponsive nonpurposeful movements initiated hypothermia protocol.  Pt was extubated and was improving from a respiratory standpoint and was transferred out of intensive care on 1/26, however overnight had worsening hypoxia and was started on Bipap.  CXR with likely   Past Medical History:  History of meningioma, spinal tumor, benign lung nodules PET positive, chronic kidney disease stage IV, hypertension, hyperlipidemia, ascending aortic aneurysm and left bundle branch block  Significant Hospital Events:  1/23 cardiac cath with cardiac stent 1/24 hypothermia protocol 1/25 Extubated to nasal cannula 1/26 Transferred out of ICU, overnight worsening hypoxia and started on Bipap and abx 1/27 continue Bipap requirement, AMS though received Ativan/Haldol, transfer back to ICU  Consults:  Cardiology  Procedures:  1/23 cardiac cath with a stent in left circumflex 1/23 ETT > 1/25 1/23 right subclavian CVC  Micro: 1/27 BCx2>> 1/27 UC>> 1/27 Sputum culture   Antibiotics: Vancomycin 1/27- Cefepime 1/27-  Objective   Blood pressure 131/88, pulse 95, temperature 98.9 F (37.2 C), temperature source Axillary, resp. rate (!) 25, height 6\' 1"  (1.854 m), weight 109 kg, SpO2 94 %. CVP:  [8 mmHg-20 mmHg] 15 mmHg  Vent Mode: PCV;BIPAP FiO2 (%):  [80 %-100 %] 80 % Set Rate:  [15 bmp] 15 bmp PEEP:  [6 cmH20-8 cmH20] 6 cmH20   Intake/Output Summary (Last 24 hours) at 10/08/2020 1547 Last data filed at 10/08/2020 1300 Gross per 24 hour  Intake 766.66 ml  Output 2750 ml  Net  -1983.34 ml   Filed Weights   10/04/20 1758 10/04/20 2115 10/06/20 0500  Weight: 103 kg 104.3 kg 109 kg    General:  Elderly M, sleeping with bipap mask  HEENT: MM pink/dry, bipap mask in place Neuro: opens eyes to voice, but immediately falling back asleep CV: s1s2 rrr, no m/r/g PULM:  Decreased air movement and course breath sounds bilateral bases without wheezing or significant rhonchi GI: soft, bsx4 active  Extremities: warm/dry, no edema  Skin: no rashes or lesions     Assessment & Plan:    Acute hypoxic/hypercapnic respiratory failure - s/p intubation and extubation.  Now with multifocal infiltrates, likely developing VAP Started on Vanc/Cefepime overnight. Pt not able to tolerate transitioning from Bipap 16/10 80% to [place] HFNC. Somnolent after Haldol and Ativan P: -leukocytosis, fever, lactic acidosis and elevated procal consistent with PNA. Continue Vancomycin/Cefepime and follow blood and respiratory cultures -He becomes agitated with Bipap mask and received Haldol 5mg  overnight followed by  Ativan 2mg , he is fairly somnolent -Trial transitioning to HFNC and attempt to avoid further sedation -Transfer to ICU for close monitoring, repeat ABG and labs this evening -NPO, possible some element of aspiration -diurese per cardiology    Acute inferior/inferior lateral ST elevation MI s/p DES. Status post V. fib cardiac arrest - s/p hypothermia protocol to 36 degrees. Hx LBB, Cardiomyopathy (EF now 20-25% on echo 1/24), HTN.  P: -Cardiology managing, considering staged PCI to LAD -continue ASA, Ticagrelor and Amiodarone  -Severely worsened EF compared to prior, 20-25%    Acute kidney injury on CKD stage IIIa  P: -- Renal function slightly worsening today, possibly  related to diuresis, CVP we -continue to follow BMP -appears euvolemic on exam, further diuresis deferred to cardiology  Acute blood loss anemia from oozing CVL site - improved. - Hgb stable, monitor  for bleeding  Hx PE - had supposedly been prescribed APixaban x 3 months but not listed on MAR. -per cards note pt was to be anticoagulated for 3 months but self-discontinued      CRITICAL CARE Performed by: Timothy Moran   Total critical care time: 40 minutes  Critical care time was exclusive of separately billable procedures and treating other patients.  Critical care was necessary to treat or prevent imminent or life-threatening deterioration.  Critical care was time spent personally by me on the following activities: development of treatment plan with patient and/or surrogate as well as nursing, discussions with consultants, evaluation of patient's response to treatment, examination of patient, obtaining history from patient or surrogate, ordering and performing treatments and interventions, ordering and review of laboratory studies, ordering and review of radiographic studies, pulse oximetry and re-evaluation of patient's condition.  Timothy Moran Timothy Hofman, PA-C [facility]  See AMION for pager details

## 2020-10-08 NOTE — Progress Notes (Addendum)
Pt increasing aggitation, RR 30-40's, lungs congested, attempted NTS with mod amount of thick tan secretions, Oxygen sat on 5L dropping into the 70's. Pt placed on 100% NRB, sats remain in the 80's. Cardiology paged to come see pt.  RRT called.  Pt remains restrained for safety.  Safety checks performed.

## 2020-10-08 NOTE — Progress Notes (Signed)
Cardiology, Dr. Paticia Stack and CCM Dr. Carson Myrtle to see pt.  Pt remains aggitated, resp rate 30-40's, diaphoretic, bp elevated, pulses 110-120's, ST.  Pt continues to fight against restraints, lungs congested, attempted NTS several times.  Bipap placed by RT.  Sats up to 90's with bipap.  Orders by Dr. Carson Myrtle received and completed.  Will continue to monitor.

## 2020-10-08 NOTE — Progress Notes (Signed)
Progress Note  Patient Name: Timothy Moran Date of Encounter: 10/08/2020  Primary Cardiologist: Dr. Jenne Campus, MD   Subjective   Per ST, pt to remain NPO as he shows signs of potential aspiration with swallow study. Required 4 point restraints overnight with ativan and Haldol. Temp elevated this AM at 101 with jump in WBC from 10.9>>15.3. CXR pending however will start empiric abx and restart cangrelor due to NPO status    Inpatient Medications    Scheduled Meds: . aspirin  81 mg Oral Daily  . atorvastatin  80 mg Oral Daily  . Chlorhexidine Gluconate Cloth  6 each Topical Daily  . enoxaparin (LOVENOX) injection  30 mg Subcutaneous Q24H  . insulin aspart  0-9 Units Subcutaneous TID WC  . sodium chloride flush  3 mL Intravenous Q12H  . Thrombi-Pad  1 each Topical Once  . ticagrelor  90 mg Oral BID   Continuous Infusions: . sodium chloride 10 mL/hr at 10/08/20 0636  . amiodarone 30 mg/hr (10/08/20 0636)   PRN Meds: sodium chloride, acetaminophen, acetaminophen, haloperidol lactate, LORazepam, ondansetron (ZOFRAN) IV, sodium chloride flush   Vital Signs    Vitals:   10/08/20 0400 10/08/20 0430 10/08/20 0500 10/08/20 0600  BP: (!) 164/111 (!) 163/103 (!) 176/112 (!) 153/101  Pulse: (!) 103 (!) 104 (!) 105 99  Resp: (!) 21 (!) 22 (!) 31 (!) 30  Temp:      TempSrc:      SpO2: 95% 96% 93% 100%  Weight:      Height:        Intake/Output Summary (Last 24 hours) at 10/08/2020 0728 Last data filed at 10/08/2020 0700 Gross per 24 hour  Intake 1133.02 ml  Output 2995 ml  Net -1861.98 ml   Filed Weights   10/04/20 1758 10/04/20 2115 10/06/20 0500  Weight: 103 kg 104.3 kg 109 kg    Physical Exam   General: Ill appearing, NAD Skin: Warm, dry, intact  Neck: Negative for carotid bruits. No JVD Lungs: Diminished in bilateral upper and lower lobes. On Bipap  Cardiovascular: RRR with S1 S2. No murmurs Abdomen: Soft, non-tender, non-distended. No obvious  abdominal masses. Extremities: No edema. Radial pulses 2+ bilaterally Neuro: Unable to assess due to Roberts  Lab 10/06/20 0317 10/07/20 0409 10/07/20 1224 10/08/20 0500  NA 137 140 140 140  K 4.9 3.8 3.6 3.9  CL 107 110 110 108  CO2 20* 20* 19* 18*  GLUCOSE 190* 139* 156* 234*  BUN 40* 51* 48* 48*  CREATININE 2.63* 2.95* 2.87* 2.97*  CALCIUM 8.2* 8.4* 8.5* 8.5*  PROT 5.8* 5.8*  --  6.2*  ALBUMIN 3.2* 3.1*  --  3.1*  AST 246* 164*  --  107*  ALT 138* 98*  --  83*  ALKPHOS 34* 32*  --  45  BILITOT 0.6 0.8  --  1.3*  GFRNONAA 24* 21* 22* 21*  ANIONGAP 10 10 11 14      Hematology Recent Labs  Lab 10/07/20 0409 10/07/20 1224 10/08/20 0500  WBC 11.5* 10.9* 15.3*  RBC 3.06* 3.07* 3.65*  HGB 9.2* 9.3* 11.2*  HCT 28.1* 28.0* 32.5*  MCV 91.8 91.2 89.0  MCH 30.1 30.3 30.7  MCHC 32.7 33.2 34.5  RDW 13.8 13.8 14.0  PLT 182 191 225    Cardiac EnzymesNo results for input(s): TROPONINI in the last 168 hours. No results for input(s): TROPIPOC in the last 168 hours.  BNP Recent Labs  Lab 10/04/20 1809  BNP 51.6     DDimer No results for input(s): DDIMER in the last 168 hours.   Radiology    Overnight EEG with video  Result Date: 10/06/2020 Lora Havens, MD     10/06/2020 10:24 AM Patient Name: Jene Gutherie MRN: [id number] Epilepsy Attending: Lora Havens Referring Physician/Provider: Dr Rollene Fare Duration: 10/05/2020 [id number] to 10/06/2020 [id number]  Patient history: 75 year old man status post cardiac arrest.  EEG to evaluate for seizures.  Level of alertness: Comatose  AEDs during EEG study: Versed  Technical aspects: This EEG study was done with scalp electrodes positioned according to the 10-20 International system of electrode placement. Electrical activity was acquired at a sampling rate of 500Hz  and reviewed with a high frequency filter of 70Hz  and a low frequency filter of 1Hz . EEG data were recorded continuously and  digitally stored.  Description: EEG showed continuous generalized 3 to 5 Hz theta- delta slowing. EEG was reactive to noxious stimulation.  Hyperventilation and photic stimulation were not performed.    ABNORMALITY -Continuous slow, generalized  IMPRESSION: This study is suggestive of severe diffuse encephalopathy, nonspecific etiology.  No seizures or epileptiform discharges were seen throughout the recording.  Lora Havens   Telemetry    10/08/20 NSR/ST with HRs in the 80-110s- Personally Reviewed  ECG    No new tracing as of 10/08/20- Personally Reviewed  Cardiac Studies   Cath 10/04/20 Conclusions: 1. Significant two-vessel coronary artery disease including thrombotic occlusion of the distal LCx, which is the culprit for the patient's inferior STEMI as well as sequential 50% and 80% proximal/mid LAD lesions. 2. Mildly elevated left ventricular filling pressure (LVEDP 20-25 mmHg). 3. Successful PCI to distal LCx using Resolute Onyx 2.5 x 30 mm drug-eluting stent complicated by no reflow treated with intracoronary adenosine and nitroglycerin, as well as aggressive antiplatelet therapy. Final angiogram shows 0% residual stenosis with TIMI-2 flow.  Recommendations: 1. Admit to 2H-ICU for post STEMI/cardiac arrest monitoring. Consider targeted temperature management; critical care medicine has been consulted to assist with targeted temperature and ventilator management. 2. Continue tirofiban infusion for 6 hours. 3. Loaded with ticagrelor when patient arrives in ICU; patient should complete 12 months of dual antiplatelet therapy with aspirin and ticagrelor. 4. Obtain echocardiogram. 5. Aggressive secondary prevention of coronary artery disease including high intensity statin therapy. 6. Continue IV amiodarone overnight. 7. Consider staged PCI to LAD.  Echocardiogram 10/05/20 1. Since the study on 06/19/2020 LVEF has decreased, now severely impaired  at 20-25% and diffuse  hypokinesis.  2. Left ventricular ejection fraction, by estimation, is 20 to 25%. The  left ventricle has severely decreased function. The left ventricle  demonstrates global hypokinesis. There is mild concentric left ventricular  hypertrophy. Left ventricular diastolic  parameters are consistent with Grade I diastolic dysfunction (impaired  relaxation).  3. Right ventricular systolic function is normal. The right ventricular  size is normal.  4. The mitral valve is normal in structure. Mild mitral valve  regurgitation. No evidence of mitral stenosis.  5. The aortic valve is normal in structure. Aortic valve regurgitation is  trivial. No aortic stenosis is present.  6. The inferior vena cava is normal in size with greater than 50%  respiratory variability, suggesting right atrial pressure of 3 mmHg.   Patient Profile     75 y.o. male a hx of LBBB, cardiomyopathy (EF 40-45%), TAA, multiple meningiomas, CKD stage III, prior PE (x2, reported as provoked), and HTN  who presented 10/04/20 as a VF cardiac arrest with inferior STEMI treated with urgent PCI to dLCx  Assessment & Plan    1. Inferior STEMI complicated by VF cardiac arrest: -LHC performed 10/04/20 with PCI/DES to dLCx with initial no flow that improved with nitro, adenosine and aggrastat -Echo with poor LVEF at 20-25% -Plan is to consider staged PCI to LAD this admission however renal and respiratory function needs to improve>>Cr up to 2.97 today  -Previously on ASA, Brilinta however given acute infection and poor respiratory status>>>will restart cangrelor today and stop Brilinta  -LFTs down-trending>>continue statin therapy  -Given VF arrest>>remains on IV amiodarone>>no recurrent arrhthymias   2. Acute agitation with hypoxic respiratory failure: -Initally intubated on hospital presentation due to arrest>>extubated per CCM 10/06/20.  -Episodes of agitation yesterday evening for which PCCM has been re- consulted for the  management of Bipap although did not tolerate very well last night. Warren Lacy, PCCM and overnight cardiology fellow called for assessment due to increasing agitation and hypoxia.  -He was given ativan and haldol>>require soft restraints for patient safety  -Leukocytosis overnight>>from 10.9>>15.3 with temp peak at 101 this AM  -CXR with multifocal airspace opacity, R>L consistent with multifocal pneumonia>>questional atypical organism pneumonia. -Will start empiric abx with vanc and cefepime per pharmacy   3. Acute on chronic kidney disease stage III: -Cr on presentation, 1.86>>>up to 2.97 today  -May consider renal consult at this point -Continue to avoid nephrotoxic medications   4. Hx of PE: -Known hx of PE>>>per OP notes, he was to be on 3 months of Eliquis however he self-discontinued  -Acute hypoxia likely in the setting of #2, less likely due to recurrent PE  5. Anemia: -Hb down to 9.3 yesterday however improved to day to 11.2 -Transitioned to Brilinta yesterday however will transition back to cangrelor given confusion, Bipap and concern for aspiration PNA. -Continue to monitor for acute bleeding and follow CBC closely   6. Ischemic cardiomyopathy: -Echo this admission with EF at 20-25% -No GDMT due to worsening renal function   7. Fever with leukocytosis: -Pt with fever at 101 today>>worisome for infection given agitation and mildly elevated LA overnight  -WBC up from 10.9 to 15.3 today -CXR from today with pending results -Given hypoxia>>worisome for aspiration PNA development?    Signed, Kathyrn Drown NP-C [facility] Pager: [phone] 10/08/2020, 7:28 AM     For questions or updates, please contact   Please consult [email or web address] for contact info under Cardiology/STEMI.

## 2020-10-08 NOTE — Progress Notes (Signed)
Brief Note:  Called to bedside by RN at around 10:35 pm. Patient described as being agitated, pulling lines and having coarse crackles & making gurgling noises. Desats to low 70s. HR 110s, RR in the 30s and SBP in the 150s. Placed on BiPAP. ABG obtained. PCCM re-consulted. ABG c/w metabolic acidosis (partially compensated). Appreciate Dr. Virginia Crews (Critical Care) input on the case.  Sats improved on the BiPAP Suctioning cleared tan plugs Albuterol treatment given Haldol prn Lasix 40mg  IV x 1  Will continue to monitor.  Melina Schools, MD, [facility]

## 2020-10-08 NOTE — Progress Notes (Signed)
Pt confused and aggitated.  Pulling oxygen and CL, OOB several times, unable to redirect pt.  Pt only oriented to self.  Placed back in bed, day shift RN B. Fitts called cardiology and got order for ativan and restraints.  Pt placed in 4 point restraints and given 1 mg Ativan IV.  Pt continues to be aggitated and fighting restraints and attempting to get OOB.  Will continue to monitor.

## 2020-10-08 NOTE — Progress Notes (Signed)
1915Patient arrived to [facility] 01 removed bilateral wrist restraints and bipap.   Placed patient on HiFlow at 80% @ 35 liter, RR 35  Patient agitated but following commands. Patient able to state his nam  Patient states that his back is hurting. Asked CCM PA for IV paint medication.

## 2020-10-08 NOTE — Progress Notes (Signed)
RT NOTE: RT transported patient on bipap from room 2C09 to room 3P94 with no complications. RT in unit given report and received patient. VSS. RT will continue to monitor.

## 2020-10-08 NOTE — Progress Notes (Signed)
eLink Physician-Brief Progress Note Patient Name: Timothy Moran DOB: 03/16/1946 MRN: [id number]   Date of Service  10/08/2020  HPI/Events of Note  Persisting agitated delirium despite receiving 2 mg of Haldol approximately 2 hours ago, patient keeps trying to pull off his BIPAP mask.  eICU Interventions  Zyprexa 5 mg IM  x 1 ordered.        Kerry Kass Ogan 10/08/2020, 4:04 AM

## 2020-10-08 NOTE — Plan of Care (Signed)
  Problem: Cardiovascular: Goal: Vascular access site(s) Level 0-1 will be maintained Outcome: Progressing   

## 2020-10-08 NOTE — Progress Notes (Signed)
Pharmacy Antibiotic Note  Deronte Solis is a 75 y.o. male with possible pneumonia.  Pharmacy has been consulted for vancomycin and cefepime dosing. -WBC= 15.3, tmax= 101, CXR consistent with pneumonia -SCr= 2.97 (baseline ~ 1.3-1.5), CrCL ~ 30, UOP ~ 2.5L  Plan: -Vancomycin 2000mg  IV x1 follow by 1250mg  IV q48hr -Cefepime 2gm IV q12h -Will follow renal function and clinical progress   Height: 6\' 1"  (185.4 cm) Weight: 109 kg (240 lb 4.8 oz) IBW/kg (Calculated) : 79.9  Temp (24hrs), Avg:99.4 F (37.4 C), Min:97.8 F (36.6 C), Max:101 F (38.3 C)  Recent Labs  Lab 10/05/20 0500 10/06/20 0317 10/07/20 0409 10/07/20 1224 10/07/20 2337 10/08/20 0500 10/08/20 0520  WBC 14.2* 12.8* 11.5* 10.9*  --  15.3*  --   CREATININE 1.66* 2.63* 2.95* 2.87*  --  2.97*  --   LATICACIDVEN  --   --   --   --  2.9*  --  2.5*    Estimated Creatinine Clearance: 27.4 mL/min (A) (by C-G formula based on SCr of 2.97 mg/dL (H)).    No Known Allergies  Antimicrobials this admission: 1/27 vanc 1/27 cefepime  Dose adjustments this admission:   Microbiology results: none  Thank you for allowing pharmacy to be a part of this patient's care.  Hildred Laser, PharmD Clinical Pharmacist **Pharmacist phone directory can now be found on [email or web address] ([email or web address]).  Listed under [place].

## 2020-10-08 NOTE — Progress Notes (Signed)
Nutrition Follow-up  RD working remotely.  DOCUMENTATION CODES:   Not applicable  INTERVENTION:   If unable to advance diet, recommend Cortrak NG tube placement and initiation of enteral nutrition. Recommend: - Osmolite 1.5 @ 55 ml/hr (1320 ml/day) - ProSource TF 45 ml TID  Recommended tube feeding regimen would provide 2100 kcal, 116 grams of protein, and 1006 ml of H2O.   - Recommend bowel regimen, no BM documented since admission  NUTRITION DIAGNOSIS:   Inadequate oral intake related to inability to eat as evidenced by NPO status.  Ongoing  GOAL:   Patient will meet greater than or equal to 90% of their needs  Unmet at this time  MONITOR:   Diet advancement,Labs,Weight trends,I & O's  REASON FOR ASSESSMENT:   Ventilator,Consult Enteral/tube feeding initiation and management  ASSESSMENT:   75 year old male who presented to the ED on 1/23 after cardiac arrest. PMH of LBBB, multiple symptomatic meningiomas, CKD stage III, 2 prior pulmonary embolisms, HTN, pulmonary nodules. Pt found to have post-cardiac arrest STEMI s/p urgent PCI of distal LCx. TTM 36 degrees initiated.  1/25 - extubated 1/26 - SLP recommending NPO, transferred out of ICU, later requiring BiPAP  Per notes, pt became febrile and agitated overnight with elevated lactate and worsening leukocytosis. Pt required BiPAP placement and remains on BiPAP this morning. Cardiology MD concerned for infection, specifically pneumonia.  Pt remains NPO. SLP unable to evaluate pt this morning due to BiPAP requirements. Discussed recommendations for Cortrak placement with initiation of enteral nutritoin if unable to advance diet with MD. MD hopeful that pt will improve with initiation of antibiotics since pt was clear and lucid yesterday but will pursue Cortrak if needed. RD to leave tube feeding recommendations.  Admit weight: 103 kg Current weight: 109 kg  Weight trending up since admission. Per RN edema  assessment, pt with non-pitting generalized edema.  Medications reviewed and include: SSI, NS @ 10 ml/hr, amiodarone, cangrelor, IV abx  Labs reviewed: BUN 48, creatinine 2.97, elevated LFTs, lactic acid 2.5 CBG's: 130-221 x 24 hours  UOP: 2995 ml x 24 hours I/O's: +859 ml since admission  NUTRITION - FOCUSED PHYSICAL EXAM:  Unable to complete at this time. RD working remotely.  Diet Order:   Diet Order    None      EDUCATION NEEDS:   No education needs have been identified at this time  Skin:  Skin Assessment: Reviewed RN Assessment  Last BM:  no documented BM  Height:   Ht Readings from Last 1 Encounters:  10/06/20 6\' 1"  (1.854 m)    Weight:   Wt Readings from Last 1 Encounters:  10/06/20 109 kg    BMI:  Body mass index is 31.7 kg/m.  Estimated Nutritional Needs:   Kcal:  2100-2300  Protein:  115-130 grams  Fluid:  2.0 L/day    Gustavus Bryant, MS, RD, LDN Inpatient Clinical Dietitian Please see AMiON for contact information.

## 2020-10-09 ENCOUNTER — Other Ambulatory Visit: Payer: Self-pay

## 2020-10-09 ENCOUNTER — Encounter (HOSPITAL_COMMUNITY): Payer: Self-pay | Admitting: Internal Medicine

## 2020-10-09 DIAGNOSIS — N1831 Chronic kidney disease, stage 3a: Secondary | ICD-10-CM | POA: Diagnosis not present

## 2020-10-09 DIAGNOSIS — N179 Acute kidney failure, unspecified: Secondary | ICD-10-CM | POA: Diagnosis not present

## 2020-10-09 DIAGNOSIS — J9601 Acute respiratory failure with hypoxia: Secondary | ICD-10-CM | POA: Diagnosis not present

## 2020-10-09 DIAGNOSIS — I469 Cardiac arrest, cause unspecified: Secondary | ICD-10-CM | POA: Diagnosis not present

## 2020-10-09 LAB — GLUCOSE, CAPILLARY
Glucose-Capillary: 137 mg/dL — ABNORMAL HIGH (ref 70–99)
Glucose-Capillary: 139 mg/dL — ABNORMAL HIGH (ref 70–99)
Glucose-Capillary: 122 mg/dL — ABNORMAL HIGH (ref 70–99)
Glucose-Capillary: 138 mg/dL — ABNORMAL HIGH (ref 70–99)
Glucose-Capillary: 159 mg/dL — ABNORMAL HIGH (ref 70–99)
Glucose-Capillary: 133 mg/dL — ABNORMAL HIGH (ref 70–99)

## 2020-10-09 LAB — COMPREHENSIVE METABOLIC PANEL
ALT: 53 U/L — ABNORMAL HIGH (ref 0–44)
AST: 50 U/L — ABNORMAL HIGH (ref 15–41)
Albumin: 2.4 g/dL — ABNORMAL LOW (ref 3.5–5.0)
Alkaline Phosphatase: 39 U/L (ref 38–126)
Anion gap: 11 (ref 5–15)
BUN: 51 mg/dL — ABNORMAL HIGH (ref 8–23)
CO2: 19 mmol/L — ABNORMAL LOW (ref 22–32)
Calcium: 7.9 mg/dL — ABNORMAL LOW (ref 8.9–10.3)
Chloride: 114 mmol/L — ABNORMAL HIGH (ref 98–111)
Creatinine, Ser: 2.7 mg/dL — ABNORMAL HIGH (ref 0.61–1.24)
GFR, Estimated: 24 mL/min — ABNORMAL LOW (ref >60)
Glucose, Bld: 164 mg/dL — ABNORMAL HIGH (ref 70–99)
Potassium: 3.6 mmol/L (ref 3.5–5.1)
Sodium: 144 mmol/L (ref 135–145)
Total Bilirubin: 1.4 mg/dL — ABNORMAL HIGH (ref 0.3–1.2)
Total Protein: 5.3 g/dL — ABNORMAL LOW (ref 6.5–8.1)

## 2020-10-09 LAB — CBC
HCT: 29.1 % — ABNORMAL LOW (ref 39.0–52.0)
Hemoglobin: 9.5 g/dL — ABNORMAL LOW (ref 13.0–17.0)
MCH: 30 pg (ref 26.0–34.0)
MCHC: 32.6 g/dL (ref 30.0–36.0)
MCV: 91.8 fL (ref 80.0–100.0)
Platelets: 172 10*3/uL (ref 150–400)
RBC: 3.17 MIL/uL — ABNORMAL LOW (ref 4.22–5.81)
RDW: 14.2 % (ref 11.5–15.5)
WBC: 10.4 10*3/uL (ref 4.0–10.5)
nRBC: 0 % (ref 0.0–0.2)

## 2020-10-09 LAB — URINE CULTURE: Culture: NO GROWTH

## 2020-10-09 LAB — MAGNESIUM: Magnesium: 2 mg/dL (ref 1.7–2.4)

## 2020-10-09 LAB — PROCALCITONIN: Procalcitonin: 4.43 ng/mL

## 2020-10-09 MED ORDER — TICAGRELOR 90 MG PO TABS
90.0000 mg | ORAL_TABLET | Freq: Two times a day (BID) | ORAL | Status: DC
  Administered 2020-10-09 – 2020-10-19 (×20): 90 mg via NASOGASTRIC
  Filled 2020-10-09 (×20): qty 1

## 2020-10-09 MED ORDER — INSULIN ASPART 100 UNIT/ML ~~LOC~~ SOLN
0.0000 [IU] | SUBCUTANEOUS | Status: DC
  Administered 2020-10-09 (×2): 1 [IU] via SUBCUTANEOUS
  Administered 2020-10-09: 2 [IU] via SUBCUTANEOUS
  Administered 2020-10-09: 1 [IU] via SUBCUTANEOUS
  Administered 2020-10-10 (×4): 2 [IU] via SUBCUTANEOUS
  Administered 2020-10-10: 12:00:00 1 [IU] via SUBCUTANEOUS
  Administered 2020-10-10 – 2020-10-11 (×5): 2 [IU] via SUBCUTANEOUS
  Administered 2020-10-11: 1 [IU] via SUBCUTANEOUS
  Administered 2020-10-11: 04:00:00 2 [IU] via SUBCUTANEOUS
  Administered 2020-10-12 (×2): 3 [IU] via SUBCUTANEOUS
  Administered 2020-10-12: 16:00:00 2 [IU] via SUBCUTANEOUS
  Administered 2020-10-12: 1 [IU] via SUBCUTANEOUS
  Administered 2020-10-12 – 2020-10-13 (×5): 2 [IU] via SUBCUTANEOUS
  Administered 2020-10-14: 1 [IU] via SUBCUTANEOUS
  Administered 2020-10-14: 2 [IU] via SUBCUTANEOUS
  Administered 2020-10-14: 3 [IU] via SUBCUTANEOUS
  Administered 2020-10-14: 1 [IU] via SUBCUTANEOUS
  Administered 2020-10-14: 3 [IU] via SUBCUTANEOUS
  Administered 2020-10-14 – 2020-10-15 (×4): 2 [IU] via SUBCUTANEOUS
  Administered 2020-10-15: 3 [IU] via SUBCUTANEOUS
  Administered 2020-10-15 – 2020-10-16 (×2): 2 [IU] via SUBCUTANEOUS
  Administered 2020-10-16: 1 [IU] via SUBCUTANEOUS
  Administered 2020-10-16: 2 [IU] via SUBCUTANEOUS
  Administered 2020-10-16 – 2020-10-17 (×2): 1 [IU] via SUBCUTANEOUS
  Administered 2020-10-17: 2 [IU] via SUBCUTANEOUS

## 2020-10-09 MED ORDER — ORAL CARE MOUTH RINSE
15.0000 mL | Freq: Two times a day (BID) | OROMUCOSAL | Status: DC
  Administered 2020-10-09 – 2020-10-24 (×22): 15 mL via OROMUCOSAL

## 2020-10-09 MED ORDER — ASPIRIN 81 MG PO CHEW
81.0000 mg | CHEWABLE_TABLET | Freq: Every day | ORAL | Status: DC
  Administered 2020-10-09 – 2020-10-19 (×11): 81 mg
  Filled 2020-10-09 (×11): qty 1

## 2020-10-09 MED ORDER — PROSOURCE TF PO LIQD
45.0000 mL | Freq: Three times a day (TID) | ORAL | Status: DC
  Administered 2020-10-09 – 2020-10-16 (×21): 45 mL
  Filled 2020-10-09 (×24): qty 45

## 2020-10-09 MED ORDER — CHLORHEXIDINE GLUCONATE 0.12 % MT SOLN
15.0000 mL | Freq: Two times a day (BID) | OROMUCOSAL | Status: DC
  Administered 2020-10-09 – 2020-10-28 (×38): 15 mL via OROMUCOSAL
  Filled 2020-10-09 (×32): qty 15

## 2020-10-09 MED ORDER — ATORVASTATIN CALCIUM 80 MG PO TABS
80.0000 mg | ORAL_TABLET | Freq: Every day | ORAL | Status: DC
  Administered 2020-10-09 – 2020-10-19 (×11): 80 mg
  Filled 2020-10-09 (×11): qty 1

## 2020-10-09 MED ORDER — WHITE PETROLATUM EX OINT
TOPICAL_OINTMENT | CUTANEOUS | Status: DC | PRN
  Administered 2020-10-16: 0.2 via TOPICAL
  Filled 2020-10-09 (×2): qty 28.35

## 2020-10-09 MED ORDER — OSMOLITE 1.5 CAL PO LIQD
1000.0000 mL | ORAL | Status: DC
  Administered 2020-10-09 – 2020-10-14 (×6): 1000 mL
  Filled 2020-10-09 (×4): qty 1000

## 2020-10-09 MED ORDER — GUAIFENESIN 100 MG/5ML PO SOLN: 10.0000 mL | ORAL | Status: DC

## 2020-10-09 MED ORDER — GUAIFENESIN 100 MG/5ML PO SOLN
10.0000 mL | ORAL | Status: DC
  Administered 2020-10-09 – 2020-10-19 (×51): 200 mg
  Filled 2020-10-09 (×21): qty 10
  Filled 2020-10-09: qty 5
  Filled 2020-10-09: qty 10
  Filled 2020-10-09: qty 5
  Filled 2020-10-09: qty 10
  Filled 2020-10-09: qty 15
  Filled 2020-10-09 (×3): qty 10
  Filled 2020-10-09: qty 5
  Filled 2020-10-09 (×2): qty 10
  Filled 2020-10-09: qty 5
  Filled 2020-10-09 (×11): qty 10
  Filled 2020-10-09 (×2): qty 5
  Filled 2020-10-09 (×5): qty 10
  Filled 2020-10-09: qty 5
  Filled 2020-10-09 (×2): qty 10

## 2020-10-09 NOTE — Progress Notes (Signed)
PT Cancellation Note  Patient Details Name: Timothy Moran MRN: [id number] DOB: 01/05/1946   Cancelled Treatment:    Reason Eval/Treat Not Completed: Patient not medically ready Patient remains sedated, nursing requesting to hold.  Will follow up as time allows.   Marguarite Arbour A Eran Windish 10/09/2020, 9:04 AM Marisa Severin, PT, DPT Acute Rehabilitation Services Pager [phone] Office [phone]

## 2020-10-09 NOTE — Progress Notes (Signed)
Nutrition Follow-up  DOCUMENTATION CODES:   Not applicable  INTERVENTION:   Tube Feeding via Cortrak: - Osmolite 1.5 @ 55 ml/hr (1320 ml/day) - ProSource TF 45 ml TID Provides 2100 kcal, 116 grams of protein, and 1006 ml of H2O.   If continues without BM after initiation of TF, recommend aggressive bowel regimen   NUTRITION DIAGNOSIS:   Inadequate oral intake related to inability to eat as evidenced by NPO status.  Being addressed via TF   GOAL:   Patient will meet greater than or equal to 90% of their needs  Progressing  MONITOR:   Diet advancement,Labs,Weight trends,I & O's  REASON FOR ASSESSMENT:   Ventilator,Consult Enteral/tube feeding initiation and management  ASSESSMENT:   75 year old male who presented to the ED on 1/23 after cardiac arrest. PMH of LBBB, multiple symptomatic meningiomas, CKD stage III, 2 prior pulmonary embolisms, HTN, pulmonary nodules. Pt found to have post-cardiac arrest STEMI s/p urgent PCI of distal LCx. TTM 36 degrees initiated.  1/23 Cardiac cath, stent, V.fib arrest 1/25 - extubated 1/26 - SLP recommending NPO, transferred out of ICU, later requiring BiPAP  NPO, currently on HFNC, Cortrak placed today. Wife at bedside and discussed nutrition poc in detail  Pt is lethargic but arousable. Pt with 1:1 sitter  Current wt 107.8 kg; admit wt 103 kg  No recent BM  Labs: reviewed Meds: ss novolog  Diet Order:   Diet Order            Diet NPO time specified  Diet effective now                 EDUCATION NEEDS:   No education needs have been identified at this time  Skin:  Skin Assessment: Reviewed RN Assessment  Last BM:  1/23  Height:   Ht Readings from Last 1 Encounters:  10/06/20 6\' 1"  (1.854 m)    Weight:   Wt Readings from Last 1 Encounters:  10/09/20 107.8 kg    Ideal Body Weight:     BMI:  Body mass index is 31.35 kg/m.  Estimated Nutritional Needs:   Kcal:  2100-2300  Protein:  115-130  grams  Fluid:  2.0 L/day   Kerman Passey MS, RDN, LDN, CNSC Registered Dietitian III Clinical Nutrition RD Pager and On-Call Pager Number Located in [place]

## 2020-10-09 NOTE — Progress Notes (Signed)
OT Cancellation Note  Patient Details Name: Boyde Grieco MRN: [id number] DOB: [birth year]   Cancelled Treatment:    Reason Eval/Treat Not Completed: Patient not medically ready.  Patient remains sedated, nursing requesting to hold.  OT to continue efforts as appropriate.    Rahul Malinak D Kensleigh Gates 10/09/2020, 8:39 AM

## 2020-10-09 NOTE — Progress Notes (Signed)
SLP Cancellation Note  Patient Details Name: Timothy Moran MRN: [id number] DOB: Jul 15, 1946   Cancelled treatment:       Reason Eval/Treat Not Completed: Medical issues which prohibited therapy. Pt off BiPAP this morning but RN recommends holding therapy. She says that plans are to pursue Cortrak today. Will plan to f/u early next week for readiness - RN in agreement. Please reach out to our weekend services [phone]) if needed before then.    Osie Bond., M.A. [facility] Pager [phone] Office [phone]  10/09/2020, 11:00 AM

## 2020-10-09 NOTE — Procedures (Signed)
Cortrak  Person Inserting Tube:  Nelle Sayed E, RD Tube Type:  Cortrak - 43 inches Tube Location:  Right nare Initial Placement:  Stomach Secured by: Bridle Technique Used to Measure Tube Placement:  Documented cm marking at nare/ corner of mouth Cortrak Secured At:  70 cm    Cortrak Tube Team Note:  Consult received to place a Cortrak feeding tube.   No x-ray is required. RN may begin using tube.    If the tube becomes dislodged please keep the tube and contact the Cortrak team at [email or web address] ([email or web address]) for replacement.  If after hours and replacement cannot be delayed, place a NG tube and confirm placement with an abdominal x-ray.    Azahel Belcastro, MS, RD, LDN RD pager number and weekend/on-call pager number located in Amion.   

## 2020-10-09 NOTE — Progress Notes (Signed)
Progress Note  Patient Name: Timothy Moran Date of Encounter: 10/09/2020  [facility] Cardiologist: Jenne Campus, MD  Subjective   Transferred to ICU yesterday given persistent need for bipap, delerium. PCCM assisting with respiratory issues, now on high flow nasal cannula. Had delerium again overnight. Remains strict NPO. Discussed cortrak placment with wife for nutrition and medications, she is amenable.  Inpatient Medications    Scheduled Meds: . aspirin  81 mg Oral Daily  . atorvastatin  80 mg Oral Daily  . Chlorhexidine Gluconate Cloth  6 each Topical Daily  . enoxaparin (LOVENOX) injection  30 mg Subcutaneous Q24H  . insulin aspart  0-9 Units Subcutaneous TID WC  . sodium chloride flush  3 mL Intravenous Q12H  . Thrombi-Pad  1 each Topical Once   Continuous Infusions: . sodium chloride Stopped (10/08/20 0943)  . amiodarone 30 mg/hr (10/09/20 1000)  . cangrelor 50 mg in NS 250 mL 0.75 mcg/kg/min (10/09/20 1000)  . ceFEPime (MAXIPIME) IV 200 mL/hr at 10/09/20 1000  . dexmedetomidine (PRECEDEX) IV infusion 1 mcg/kg/hr (10/09/20 1000)   PRN Meds: sodium chloride, acetaminophen, acetaminophen, fentaNYL (SUBLIMAZE) injection, LORazepam, sodium chloride flush, white petrolatum   Vital Signs    Vitals:   10/09/20 0700 10/09/20 0730 10/09/20 0800 10/09/20 0900  BP: 120/86  102/63 (!) 145/89  Pulse: 68 (!) 25 (!) 59 78  Resp: 17 (!) 36 (!) 25 (!) 26  Temp:   98.8 F (37.1 C)   TempSrc:   Axillary   SpO2: 90% 92% 97% 93%  Weight:      Height:        Intake/Output Summary (Last 24 hours) at 10/09/2020 1035 Last data filed at 10/09/2020 1000 Gross per 24 hour  Intake 1602.23 ml  Output 1650 ml  Net -47.77 ml   Last 3 Weights 10/09/2020 10/06/2020 10/04/2020  Weight (lbs) 237 lb 10.5 oz 240 lb 4.8 oz 229 lb 15 oz  Weight (kg) 107.8 kg 109 kg 104.3 kg      Telemetry    Sinus rhythm with intermittent PACs and PVCs- Personally Reviewed  ECG    1/28 SR,  PACs, LBBB- Personally Reviewed  Physical Exam   GEN: Appears agitated, mitts and restraints in place, trying to pull at lines. NECK: No JVD appreciated CARDIAC: regular rhythm, normal S1 and S2, no rubs or gallops. No murmur. L subclavian line c/d/i VASCULAR: Radial pulses 2+ bilaterally.  RESPIRATORY:  Scattered coarseness but improved from yesterday's exam ABDOMEN: Soft, non-tender, non-distended MUSCULOSKELETAL:  Moves all 4 limbs independently SKIN: Warm and dry, no edema NEUROLOGIC:  No focal neuro deficits noted. PSYCHIATRIC:  Agitated, responds to voice  Labs    High Sensitivity Troponin:   Recent Labs  Lab 10/04/20 1808 10/04/20 2324 10/05/20 0500  TROPONINIHS 75* >27,000* >27,000*      Chemistry Recent Labs  Lab 10/07/20 0409 10/07/20 1224 10/08/20 0500 10/08/20 1635 10/09/20 0402  NA 140   < > 140 145 144  K 3.8   < > 3.9 3.6 3.6  CL 110   < > 108 112* 114*  CO2 20*   < > 18* 19* 19*  GLUCOSE 139*   < > 234* 149* 164*  BUN 51*   < > 48* 47* 51*  CREATININE 2.95*   < > 2.97* 2.71* 2.70*  CALCIUM 8.4*   < > 8.5* 8.3* 7.9*  PROT 5.8*  --  6.2*  --  5.3*  ALBUMIN 3.1*  --  3.1*  --  2.4*  AST 164*  --  107*  --  50*  ALT 98*  --  83*  --  53*  ALKPHOS 32*  --  45  --  39  BILITOT 0.8  --  1.3*  --  1.4*  GFRNONAA 21*   < > 21* 24* 24*  ANIONGAP 10   < > 14 14 11    < > = values in this interval not displayed.     Hematology Recent Labs  Lab 10/08/20 0500 10/08/20 1635 10/09/20 0402  WBC 15.3* 13.9* 10.4  RBC 3.65* 3.36* 3.17*  HGB 11.2* 10.3* 9.5*  HCT 32.5* 30.4* 29.1*  MCV 89.0 90.5 91.8  MCH 30.7 30.7 30.0  MCHC 34.5 33.9 32.6  RDW 14.0 14.0 14.2  PLT 225 196 172    BNP Recent Labs  Lab 10/04/20 1809 10/08/20 1456  BNP 51.6 1,434.0*     DDimer No results for input(s): DDIMER in the last 168 hours.   Radiology    DG Chest Port 1 View  Result Date: 10/08/2020 CLINICAL DATA:  Shortness of breath EXAM: PORTABLE CHEST 1 VIEW  COMPARISON:  October 04, 2020 FINDINGS: Endotracheal tube and nasogastric tube have been removed. Central catheter tip is in the superior vena cava slightly beyond the junction with the left innominate vein. No pneumothorax. There is airspace opacity in right mid and lower lung regions as well as in the left perihilar region. Heart is enlarged, stable, with pulmonary vascularity within normal limits. No adenopathy. No bone lesions. IMPRESSION: Multifocal airspace opacity, more on the right than the left. Appearance consistent with multifocal pneumonia. Question atypical organism pneumonia. Cardiomegaly is stable. Central catheter as described without pneumothorax. Electronically Signed   By: Lowella Grip III M.D.   On: 10/08/2020 08:01    Cardiac Studies   Cath 10/04/20 Conclusions: 1. Significant two-vessel coronary artery disease including thrombotic occlusion of the distal LCx, which is the culprit for the patient's inferior STEMI as well as sequential 50% and 80% proximal/mid LAD lesions. 2. Mildly elevated left ventricular filling pressure (LVEDP 20-25 mmHg). 3. Successful PCI to distal LCx using Resolute Onyx 2.5 x 30 mm drug-eluting stent complicated by no reflow treated with intracoronary adenosine and nitroglycerin, as well as aggressive antiplatelet therapy. Final angiogram shows 0% residual stenosis with TIMI-2 flow.  Recommendations: 1. Admit to 2H-ICU for post STEMI/cardiac arrest monitoring. Consider targeted temperature management; critical care medicine has been consulted to assist with targeted temperature and ventilator management. 2. Continue tirofiban infusion for 6 hours. 3. Loaded with ticagrelor when patient arrives in ICU; patient should complete 12 months of dual antiplatelet therapy with aspirin and ticagrelor. 4. Obtain echocardiogram. 5. Aggressive secondary prevention of coronary artery disease including high intensity statin therapy. 6. Continue IV amiodarone  overnight. 7. Consider staged PCI to LAD.  Echocardiogram 10/05/20 1. Since the study on 06/19/2020 LVEF has decreased, now severely impaired  at 20-25% and diffuse hypokinesis.  2. Left ventricular ejection fraction, by estimation, is 20 to 25%. The  left ventricle has severely decreased function. The left ventricle  demonstrates global hypokinesis. There is mild concentric left ventricular  hypertrophy. Left ventricular diastolic  parameters are consistent with Grade I diastolic dysfunction (impaired  relaxation).  3. Right ventricular systolic function is normal. The right ventricular  size is normal.  4. The mitral valve is normal in structure. Mild mitral valve  regurgitation. No evidence of mitral stenosis.  5. The aortic valve is normal in structure. Aortic valve  regurgitation is  trivial. No aortic stenosis is present.  6. The inferior vena cava is normal in size with greater than 50%  respiratory variability, suggesting right atrial pressure of 3 mmHg.   Patient Profile     75 y.o. male with PMH LBBB, cardiomyopathy (EF 40-45%), TAA, multiple meningiomas, stage 3 chronic kidney disease, prior PE (x2, reported as provoked), hypertension who presented 10/04/20 as a VF cardiac arrest with inferior STEMI. Hospital course complicated by agitation/delerium, pneumonia, AKI.  Assessment & Plan    Hypoxic respiratory failure Leukocytosis Fever Lactic acidosis Pneumonia on CXR Elevated procalcitonin -cultured, began broad spectrum antibiotics 1/27. Cultures NGTD. -suspect aspiration pneumonia vs. HCAP vs. VAP -last ABG 7.469/27/76/19 -white count improved, but procalcitonin still elevated today -now on HF [place] and maintaining O2 sats  Agitation/Delerium -was extremely lucid and appropriate 1/26, but likely due to infection his mental status worsened 1/27. He is improved but continues to have episodic delerium, especially at night -he does not appear appropriate for swallow  study today. Discussed cortrak with wife, and she agrees. Will assist [place] with nutrition and medications.  Inferior/inferolateral STEMI, complicated by cardiac arrest/VF as initial rhythm -s/p urgent PCI of distal LCx. Initial no-reflow, but improved with nitro, adenosine, and aggrastat -echo now with EF 20-25% -continue aspirin, ticagrelor. Have to use cangrelor until cleared for swallow or cortak placed -will consider staged PCI to LAD this admission, though need kidney function to improve before this can be done safely -he is intermittently bradycardic, holding on beta blocker  -peak hsTn 27,000. Initial was 75 -on rosuvastatin currently. LFTs now downtrending, continue statin -lipids show LDL 42. A1c 7.0, consistent with diabetes. -given VF arrest, remains on amiodarone. IV for now.  Acute kidney injury on chronic kidney disease stage 3: -presented Cr 1.86, peaked at 2.97 now downtrending. 2.70 today. Prior to admission was 1.38 -making good urine -checked coox 1/25, 64% and 1/27, 62%. -continue to monitor  Acute blood loss anemia -severe oozing 1/24 at site of central line, now resolved -Hgb has drifted, today similar to two days ago (was better yesterday). No evidence of bleeding currently, but he is on cangrelor -monitoring closely  History of prior pulmonary embolisms -pre Dr. Wendy Poet note 08/03/20, plan for 3 mons of anticoagulation with apixaban. However, I do not see this on his medicine list. Per patient's wife, he self-discontinued this.   LBBB -chronic  Cardiomyopathy, EF 40-45% prior to cardiac arrest -Echo 10/05/20 now 20-25% -was on valsartan as an outpatient -will add guideline directed medical therapy as clinical condition allows. Currently limited by blood pressure, renal function, and bradycardia -now with diagnosis of diabetes, consider SGLT2i when more clinically stable  Hypertension: -has been labile. Will monitor. Will add back medications as renal  function and blood pressure allow  Resolved issues: C-collar -placed at time of arrest. Patient removed collar overnight 1/25, clinically cleared today to keep c-collar off.  Targeted temperature management -now complete  CRITICAL CARE Patient is critically ill with multiple organ systems affected and requires high complexity decision making. Total critical care time: 50 minutes. This time includes gathering of history, evaluation of patient's response to treatment, examination of patient, review of laboratory and imaging studies, and coordination with consultants. Greater than 50% of time spent in direct patient care.  For questions or updates, please contact [place] Please consult [email or web address] for contact info under     Signed, Buford Dresser, MD  10/09/2020, 10:35 AM

## 2020-10-09 NOTE — Plan of Care (Signed)
  Problem: Clinical Measurements: Goal: Will remain free from infection Outcome: Not Progressing   Problem: Clinical Measurements: Goal: Respiratory complications will improve Outcome: Not Progressing   Bounceback to CVICU with respiratory distress and agitated delirium secondary to aspiration pneumonia vs VAP.  Now HHFNC dependent, unable to tolerate bipap.

## 2020-10-09 NOTE — Progress Notes (Signed)
NAME:  Timothy Moran, MRN:  [id number], DOB:  11/02/1945, LOS: 5 ADMISSION DATE:  10/04/2020, CONSULTATION DATE: October 05, 2019 REFERRING MD: Cardiology, CHIEF COMPLAINT: Cardiac arrest  Brief History:  75 year old male status post V. Fib cardiac arrest witnessed by wife, shocked 4 and 2 epi.  Patient was noted to have acute inferior/inferolateral STEMI, underwent cardiac cath with stent and circumflex came up unresponsive nonpurposeful movements initiated hypothermia protocol.  Pt was extubated and was improving from a respiratory standpoint and was transferred out of intensive care on 1/26, however overnight had worsening hypoxia and was started on Bipap.  CXR with likely pneumonia  Past Medical History:  History of meningioma, spinal tumor, benign lung nodules PET positive, chronic kidney disease stage IV, hypertension, hyperlipidemia, ascending aortic aneurysm and left bundle branch block  Significant Hospital Events:  1/23 cardiac cath with cardiac stent 1/24 hypothermia protocol 1/25 Extubated to nasal cannula 1/26 Transferred out of ICU, overnight worsening hypoxia and started on Bipap and abx 1/27 continue Bipap requirement, AMS though received Ativan/Haldol, transfer back to ICU  Consults:  Cardiology  Procedures:  1/23 cardiac cath with a stent in left circumflex 1/23 ETT > 1/25 1/23 right subclavian CVC  Micro: 1/27 BCx2>> ngtd 1/27 UC>> neg 1/27 Sputum culture: not provided  Antibiotics: Vancomycin 1/27-1/28 Cefepime 1/27-  INTERVAL EVENTS:  1/28: pt severely tachypneic, delirious on amio and precedex. On hhfnc. Wife at bedside. Aggressive goals. Close monitoring as may warrant ett today with wob but wife states this is what he appeared like yesterday as well. Not a great candidate for NIV 2/2 pna and delirium/restraints and wife endorses pt does not tolerate it well.  Objective   Blood pressure 120/86, pulse (!) 25, temperature 98.8 F (37.1 C), temperature  source Axillary, resp. rate (!) 36, height 6\' 1"  (1.854 m), weight 107.8 kg, SpO2 92 %.    FiO2 (%):  [50 %-100 %] 60 %   Intake/Output Summary (Last 24 hours) at 10/09/2020 0824 Last data filed at 10/09/2020 0700 Gross per 24 hour  Intake 1310.17 ml  Output 1500 ml  Net -189.83 ml   Filed Weights   10/04/20 2115 10/06/20 0500 10/09/20 0440  Weight: 104.3 kg 109 kg 107.8 kg    General:  Elderly M, sleeping with bipap mask  HEENT: MM pink/dry, bipap mask in place Neuro: opens eyes to voice, but immediately falling back asleep CV: s1s2 rrr, no m/r/g PULM:  Decreased air movement and course breath sounds bilateral bases without wheezing or significant rhonchi GI: soft, bsx4 active  Extremities: warm/dry, no edema  Skin: no rashes or lesions     Assessment & Plan:    Acute hypoxic/hypercapnic respiratory failure - s/p intubation and extubation.  Now with multifocal infiltrates, likely developing VAP Started on Vanc/Cefepime overnight. Pt not able to tolerate transitioning from Bipap to [facility].   P: -leukocytosis, fever, lactic acidosis and elevated procal consistent with PNA. -neg mrsa pcr so will stop vanc -cont cefepime 7 days.  -He becomes agitated with Bipap mask -on precedex infusion asn remains restless -on HHFNC with inability to wean - ICU for close monitoring -NPO, possible some element of aspiration -diurese per cardiology   Acute inferior/inferior lateral ST elevation MI s/p DES. Status post V. fib cardiac arrest - s/p hypothermia protocol to 36 degrees. Hx LBB, Cardiomyopathy (EF now 20-25% on echo 1/24), HTN.  P: -Cardiology managing, considering staged PCI to LAD -continue ASA, Ticagrelor and Amiodarone  -Severely worsened EF compared to prior,  20-25%    Acute kidney injury on CKD stage IIIa  P: -- Renal function stable and slowly improving -baseline Cr appears to be ~1.7 -continue to follow BMP -appears euvolemic on exam, further diuresis deferred  to cardiology  Acute blood loss anemia from oozing CVL site - improved. - Hgb cont to decrease 11->10->9  Hx PE - had supposedly been prescribed APixaban x 3 months but not listed on MAR. -per cards note pt was to be anticoagulated for 3 months but self-discontinued   Dm2:  -a1c 7 -monitor bs     CRITICAL CARE Performed by: Audria Nine   Total critical care time: 41 minutes  Critical care time was exclusive of separately billable procedures and treating other patients.  Critical care was necessary to treat or prevent imminent or life-threatening deterioration.  Critical care was time spent personally by me on the following activities: development of treatment plan with patient and/or surrogate as well as nursing, discussions with consultants, evaluation of patient's response to treatment, examination of patient, obtaining history from patient or surrogate, ordering and performing treatments and interventions, ordering and review of laboratory studies, ordering and review of radiographic studies, pulse oximetry and re-evaluation of patient's condition.

## 2020-10-10 DIAGNOSIS — I4901 Ventricular fibrillation: Secondary | ICD-10-CM | POA: Diagnosis not present

## 2020-10-10 DIAGNOSIS — I469 Cardiac arrest, cause unspecified: Secondary | ICD-10-CM | POA: Diagnosis not present

## 2020-10-10 DIAGNOSIS — N179 Acute kidney failure, unspecified: Secondary | ICD-10-CM | POA: Diagnosis not present

## 2020-10-10 LAB — COMPREHENSIVE METABOLIC PANEL
ALT: 51 U/L — ABNORMAL HIGH (ref 0–44)
AST: 41 U/L (ref 15–41)
Albumin: 2.4 g/dL — ABNORMAL LOW (ref 3.5–5.0)
Alkaline Phosphatase: 47 U/L (ref 38–126)
Anion gap: 11 (ref 5–15)
BUN: 60 mg/dL — ABNORMAL HIGH (ref 8–23)
CO2: 19 mmol/L — ABNORMAL LOW (ref 22–32)
Calcium: 8.1 mg/dL — ABNORMAL LOW (ref 8.9–10.3)
Chloride: 117 mmol/L — ABNORMAL HIGH (ref 98–111)
Creatinine, Ser: 2.66 mg/dL — ABNORMAL HIGH (ref 0.61–1.24)
GFR, Estimated: 24 mL/min — ABNORMAL LOW (ref >60)
Glucose, Bld: 176 mg/dL — ABNORMAL HIGH (ref 70–99)
Potassium: 3.5 mmol/L (ref 3.5–5.1)
Sodium: 147 mmol/L — ABNORMAL HIGH (ref 135–145)
Total Bilirubin: 1.5 mg/dL — ABNORMAL HIGH (ref 0.3–1.2)
Total Protein: 5.7 g/dL — ABNORMAL LOW (ref 6.5–8.1)

## 2020-10-10 LAB — CBC
HCT: 30.5 % — ABNORMAL LOW (ref 39.0–52.0)
Hemoglobin: 9.8 g/dL — ABNORMAL LOW (ref 13.0–17.0)
MCH: 29.9 pg (ref 26.0–34.0)
MCHC: 32.1 g/dL (ref 30.0–36.0)
MCV: 93 fL (ref 80.0–100.0)
Platelets: 184 10*3/uL (ref 150–400)
RBC: 3.28 MIL/uL — ABNORMAL LOW (ref 4.22–5.81)
RDW: 14.3 % (ref 11.5–15.5)
WBC: 10 10*3/uL (ref 4.0–10.5)
nRBC: 0 % (ref 0.0–0.2)

## 2020-10-10 LAB — POCT I-STAT 7, (LYTES, BLD GAS, ICA,H+H)
Acid-base deficit: 10 mmol/L — ABNORMAL HIGH (ref 0.0–2.0)
Bicarbonate: 15.4 mmol/L — ABNORMAL LOW (ref 20.0–28.0)
Calcium, Ion: 1.18 mmol/L (ref 1.15–1.40)
HCT: 32 % — ABNORMAL LOW (ref 39.0–52.0)
Hemoglobin: 10.9 g/dL — ABNORMAL LOW (ref 13.0–17.0)
O2 Saturation: 91 %
Patient temperature: 98.9
Potassium: 3.7 mmol/L (ref 3.5–5.1)
Sodium: 143 mmol/L (ref 135–145)
TCO2: 16 mmol/L — ABNORMAL LOW (ref 22–32)
pCO2 arterial: 31.4 mmHg — ABNORMAL LOW (ref 32.0–48.0)
pH, Arterial: 7.299 — ABNORMAL LOW (ref 7.350–7.450)
pO2, Arterial: 68 mmHg — ABNORMAL LOW (ref 83.0–108.0)

## 2020-10-10 LAB — GLUCOSE, CAPILLARY
Glucose-Capillary: 161 mg/dL — ABNORMAL HIGH (ref 70–99)
Glucose-Capillary: 152 mg/dL — ABNORMAL HIGH (ref 70–99)
Glucose-Capillary: 146 mg/dL — ABNORMAL HIGH (ref 70–99)
Glucose-Capillary: 179 mg/dL — ABNORMAL HIGH (ref 70–99)
Glucose-Capillary: 169 mg/dL — ABNORMAL HIGH (ref 70–99)
Glucose-Capillary: 189 mg/dL — ABNORMAL HIGH (ref 70–99)

## 2020-10-10 LAB — PROCALCITONIN: Procalcitonin: 2.64 ng/mL

## 2020-10-10 MED ORDER — OXYCODONE HCL 5 MG/5ML PO SOLN
5.0000 mg | Freq: Four times a day (QID) | ORAL | Status: DC | PRN
  Administered 2020-10-10 (×2): 5 mg via ORAL
  Filled 2020-10-10 (×3): qty 5

## 2020-10-10 MED ORDER — ISOSORBIDE DINITRATE 10 MG PO TABS
30.0000 mg | ORAL_TABLET | Freq: Two times a day (BID) | ORAL | Status: DC
  Administered 2020-10-10 – 2020-10-19 (×19): 30 mg via NASOGASTRIC
  Filled 2020-10-10 (×19): qty 3

## 2020-10-10 NOTE — Evaluation (Signed)
Physical Therapy Evaluation Patient Details Name: Timothy Moran MRN: [id number] DOB: [birth year] Today's Date: 10/10/2020   History of Present Illness  75 year old male with a history of LBBB, mildly reduced LVEF, 4.2cm TAA, multiple symptomatic meningiomas (possible NF2 suspected), CKD stage III, two prior pulmonary embolisms (now off Eliquis), HTN, and pulmonary nodules, who presented as a resuscitated VF arrest at home and was found to have inferior ST elevation on ECG. He began having chest pain while shoveling snow this afternoon. He collapsed and lost consciousness with no obvious injuries in the fall. No bystander CPR performed. He was found with agonal breathing and an AED-shockable rhythm. He received a total of 4 defibrillations by fire and EMS.  Patient is more alert, but remains confused with decreased awareness and safety.  Clinical Impression  Pt presents to PT with deficits in functional mobility, gait, balance, endurance, strength, power, cognition. Pt is generally weak at this time, slightly limited by pain in chest with coughing or mobility. Pt with impaired sitting and standing balance and demonstrates limited awareness of deficits at this time. Pt will benefit from aggressive mobilization and acute PT POC to improve balance and progress gait training. PT recommends CIR placement at the time of discharge as the pt was independent prior to admission and demonstrates the potential to return to a supervision level with high intensity inpatient PT services.    Follow Up Recommendations CIR    Equipment Recommendations  Rolling walker with 5" wheels;Wheelchair (measurements PT);Wheelchair cushion (measurements PT)    Recommendations for Other Services Rehab consult     Precautions / Restrictions Precautions Precautions: Fall Restrictions Weight Bearing Restrictions: No      Mobility  Bed Mobility Overal bed mobility: Needs Assistance Bed Mobility: Supine to Sit;Sit to  Supine     Supine to sit: Mod assist;HOB elevated Sit to supine: Mod assist;HOB elevated        Transfers Overall transfer level: Needs assistance Equipment used: 1 person hand held assist Transfers: Sit to/from Stand Sit to Stand: Mod assist         General transfer comment: pt with strong R lateral lean initially, becomes an anterior lean onto PT  Ambulation/Gait Ambulation/Gait assistance: Mod assist Gait Distance (Feet): 2 Feet Assistive device: 1 person hand held assist Gait Pattern/deviations: Step-to pattern Gait velocity: reduced Gait velocity interpretation: <1.31 ft/sec, indicative of household ambulator General Gait Details: 2 steps to right side at the edge of bed  Stairs            Wheelchair Mobility    Modified Rankin (Stroke Patients Only)       Balance Overall balance assessment: Needs assistance Sitting-balance support: Feet supported;Single extremity supported;Bilateral upper extremity supported Sitting balance-Leahy Scale: Poor Sitting balance - Comments: minG with UE support of bed   Standing balance support: Bilateral upper extremity supported Standing balance-Leahy Scale: Poor Standing balance comment: modA with BUE support of PT                             Pertinent Vitals/Pain Pain Assessment: Faces Faces Pain Scale: Hurts whole lot Pain Location: chest with coughing Pain Descriptors / Indicators: Grimacing;Moaning Pain Intervention(s): Monitored during session (encourage use of cardiac pillow)    Home Living Family/patient expects to be discharged to:: Private residence Living Arrangements: Spouse/significant other Available Help at Discharge: Family;Available 24 hours/day Type of Home: House Home Access: Stairs to enter Entrance Stairs-Rails: Right Entrance Stairs-Number of  Steps: 6 Home Layout: Two level;Able to live on main level with bedroom/bathroom Home Equipment: Cane - single point;Grab bars -  tub/shower      Prior Function Level of Independence: Independent               Hand Dominance   Dominant Hand: Right    Extremity/Trunk Assessment   Upper Extremity Assessment Upper Extremity Assessment: Defer to OT evaluation    Lower Extremity Assessment Lower Extremity Assessment: Generalized weakness    Cervical / Trunk Assessment Cervical / Trunk Assessment: Normal  Communication   Communication: No difficulties  Cognition Arousal/Alertness: Awake/alert Behavior During Therapy: WFL for tasks assessed/performed Overall Cognitive Status: Impaired/Different from baseline Area of Impairment: Orientation;Attention;Following commands;Memory;Safety/judgement;Awareness;Problem solving                 Orientation Level: Disoriented to;Place Current Attention Level: Sustained Memory: Decreased short-term memory Following Commands: Follows one step commands consistently Safety/Judgement: Decreased awareness of safety;Decreased awareness of deficits Awareness: Intellectual Problem Solving: Requires verbal cues;Requires tactile cues;Difficulty sequencing        General Comments General comments (skin integrity, edema, etc.): VSS, pt on 20L heated high flow 60% FiO2    Exercises     Assessment/Plan    PT Assessment Patient needs continued PT services  PT Problem List Decreased strength;Decreased activity tolerance;Decreased balance;Decreased mobility;Decreased cognition;Decreased knowledge of use of DME;Decreased safety awareness;Decreased knowledge of precautions;Cardiopulmonary status limiting activity       PT Treatment Interventions DME instruction;Gait training;Stair training;Functional mobility training;Therapeutic activities;Therapeutic exercise;Balance training;Neuromuscular re-education;Patient/family education    PT Goals (Current goals can be found in the Care Plan section)  Acute Rehab PT Goals Patient Stated Goal: to improve mobility and  return to independence PT Goal Formulation: With patient/family Time For Goal Achievement: 10/24/20 Potential to Achieve Goals: Good    Frequency Min 3X/week   Barriers to discharge        Co-evaluation               AM-PAC PT "6 Clicks" Mobility  Outcome Measure Help needed turning from your back to your side while in a flat bed without using bedrails?: A Lot Help needed moving from lying on your back to sitting on the side of a flat bed without using bedrails?: A Lot Help needed moving to and from a bed to a chair (including a wheelchair)?: A Lot Help needed standing up from a chair using your arms (e.g., wheelchair or bedside chair)?: A Lot Help needed to walk in hospital room?: A Lot Help needed climbing 3-5 steps with a railing? : Total 6 Click Score: 11    End of Session Equipment Utilized During Treatment: Oxygen Activity Tolerance: Patient tolerated treatment well Patient left: in bed;with call bell/phone within reach;with family/visitor present (bed alarm does not work, Therapist, sports is aware) Nurse Communication: Mobility status PT Visit Diagnosis: Unsteadiness on feet (R26.81);Other abnormalities of gait and mobility (R26.89);Muscle weakness (generalized) (M62.81)    Time: 5003-7048 PT Time Calculation (min) (ACUTE ONLY): 20 min   Charges:   PT Evaluation $PT Eval Moderate Complexity: 1 Mod          Zenaida Niece, PT, DPT Acute Rehabilitation Pager: [phone]   Zenaida Niece 10/10/2020, 3:18 PM

## 2020-10-10 NOTE — Evaluation (Signed)
Occupational Therapy Evaluation Patient Details Name: Timothy Moran MRN: [id number] DOB: 1946-03-18 Today's Date: 10/10/2020    History of Present Illness 75 year old male with a history of LBBB, mildly reduced LVEF, 4.2cm TAA, multiple symptomatic meningiomas (possible NF2 suspected), CKD stage III, two prior pulmonary embolisms (now off Eliquis), HTN, and pulmonary nodules, who presented as a resuscitated VF arrest at home and was found to have inferior ST elevation on ECG. He began having chest pain while shoveling snow this afternoon. He collapsed and lost consciousness with no obvious injuries in the fall. No bystander CPR performed. He was found with agonal breathing and an AED-shockable rhythm. He received a total of 4 defibrillations by fire and EMS.  Patient is more alert, but remains confused with decreased awareness and safety.   Clinical Impression   Patient admitted with the above diagnosis.  Presents with the deficits listed below.  PTA he states he was independent with all care and mobility.  Currently, he is needing up to [place] a of 2 with basic transfers due to decreased safety and awareness.  He is needing up to Mod A for lower body ADL.  OT to continue to see him in the acute setting to maximize his functional status.  At this point [facility] is recommended, but that could change based on progress and improved mentation.      Follow Up Recommendations  Home health OT    Equipment Recommendations  Tub/shower seat    Recommendations for Other Services       Precautions / Restrictions Precautions Precautions: Fall Restrictions Weight Bearing Restrictions: No      Mobility Bed Mobility Overal bed mobility: Needs Assistance Bed Mobility: Supine to Sit     Supine to sit: Mod assist     General bed mobility comments: back pain for initial sitting up.    Transfers Overall transfer level: Needs assistance   Transfers: Sit to/from Stand;Stand Pivot Transfers Sit to  Stand: Min assist;+2 safety/equipment Stand pivot transfers: Min assist;+2 safety/equipment       General transfer comment: patient is able to move under his own power, decreased awareness and safety.  Pain to ribs.    Balance Overall balance assessment: Needs assistance Sitting-balance support: No upper extremity supported Sitting balance-Leahy Scale: Poor Sitting balance - Comments: moves and shifts his position   Standing balance support: Bilateral upper extremity supported Standing balance-Leahy Scale: Poor                             ADL either performed or assessed with clinical judgement   ADL Overall ADL's : Needs assistance/impaired Eating/Feeding: NPO   Grooming: Wash/dry hands;Wash/dry face;Oral care;Minimal assistance;Sitting               Lower Body Dressing: Moderate assistance;Bed level Lower Body Dressing Details (indicate cue type and reason): placing socks             Functional mobility during ADLs: Minimal assistance;+2 for safety/equipment       Vision Patient Visual Report: No change from baseline       Perception     Praxis      Pertinent Vitals/Pain Faces Pain Scale: Hurts even more Pain Location: both sides of his chest and back. Pain Descriptors / Indicators: Grimacing;Discomfort Pain Intervention(s): Monitored during session     Hand Dominance Right   Extremity/Trunk Assessment Upper Extremity Assessment Upper Extremity Assessment: Overall WFL for tasks assessed  Lower Extremity Assessment Lower Extremity Assessment: Defer to PT evaluation   Cervical / Trunk Assessment Cervical / Trunk Assessment: Normal   Communication Communication Communication: No difficulties   Cognition Arousal/Alertness: Awake/alert Behavior During Therapy: Restless Overall Cognitive Status: Impaired/Different from baseline Area of Impairment: Orientation;Following commands;Safety/judgement;Attention                  Orientation Level: Person;Place Current Attention Level: Sustained   Following Commands: Follows one step commands with increased time Safety/Judgement: Decreased awareness of safety;Decreased awareness of deficits         General Comments  decreased safety and awareness.  HR to 103 with transfers.  O2 steady at 93% with high flow.     Exercises     Shoulder Instructions      Home Living Family/patient expects to be discharged to:: Private residence Living Arrangements: Spouse/significant other Available Help at Discharge: Family;Available 24 hours/day Type of Home: House Home Access: Stairs to enter [employer] of Steps: 3 or 4   Home Layout: Two level;Able to live on main level with bedroom/bathroom Alternate Level Stairs-Number of Steps: full flight   Bathroom Shower/Tub: Occupational psychologist: Standard     Home Equipment: Grab bars - tub/shower          Prior Functioning/Environment Level of Independence: Independent                 OT Problem List: Decreased activity tolerance;Impaired balance (sitting and/or standing);Decreased safety awareness;Decreased cognition;Pain      OT Treatment/Interventions: Self-care/ADL training;Therapeutic exercise;Balance training;Patient/family education;Cognitive remediation/compensation;Therapeutic activities    OT Goals(Current goals can be found in the care plan section) Acute Rehab OT Goals Patient Stated Goal: I want some water OT Goal Formulation: With patient Time For Goal Achievement: 10/24/20 Potential to Achieve Goals: Good ADL Goals Pt Will Perform Grooming: with supervision;standing Pt Will Perform Lower Body Bathing: with supervision;sit to/from stand Pt Will Perform Lower Body Dressing: with supervision;sit to/from stand Pt Will Transfer to Toilet: with supervision;ambulating Pt Will Perform Toileting - Clothing Manipulation and hygiene: with supervision;sit to/from stand  OT  Frequency: Min 2X/week   Barriers to D/C:    none noted       Co-evaluation              AM-PAC OT "6 Clicks" Daily Activity     Outcome Measure Help from another person eating meals?: Total Help from another person taking care of personal grooming?: A Little Help from another person toileting, which includes using toliet, bedpan, or urinal?: A Lot Help from another person bathing (including washing, rinsing, drying)?: A Lot Help from another person to put on and taking off regular upper body clothing?: A Lot Help from another person to put on and taking off regular lower body clothing?: A Lot 6 Click Score: 12   End of Session Equipment Utilized During Treatment: Gait belt Nurse Communication: Mobility status  Activity Tolerance: Patient limited by pain Patient left: in chair;with call bell/phone within reach;with chair alarm set;with nursing/sitter in room  OT Visit Diagnosis: Unsteadiness on feet (R26.81);Pain                Time: 6834-1962 OT Time Calculation (min): 32 min Charges:  OT General Charges $OT Visit: 1 Visit OT Evaluation $OT Eval Moderate Complexity: 1 Mod OT Treatments $Self Care/Home Management : 8-22 mins  10/10/2020  Rich, OTR/L  Acute Rehabilitation Services  Office:  [employer] 10/10/2020, 9:20 AM

## 2020-10-10 NOTE — Progress Notes (Signed)
  Speech Language Pathology Treatment: Dysphagia  Patient Details Name: Timothy Moran MRN: [id number] DOB: April 16, 1946 Today's Date: 10/10/2020 Time: 2353-6144 SLP Time Calculation (min) (ACUTE ONLY): 20 min  Assessment / Plan / Recommendation Clinical Impression  Patient seen to address dysphagia goals. RN had requested SLP see patient today as he is more alert, less confused and is asking for water. Patient now has [employer] for nutrition/meds. Patient's spouse present in room. SLP performed oral care and observed that patient's oral mucosa was dry and there were small amounts of dried dark secretions on hard palate. Patient's voice was very clear and strong. He fluctuated a little with RR, from 25-32 and at times reaching 37-38. Breathing was mainly clavicular. SpO2 was steady in mid to high 90%'s. Patient is impulsive but able to take relatively small cup sips of thin liquids (water). Swallow did appear discoordinated. He consumed small amount of ice chips (one at a time, 4 total). No immediate coughing noted but after two small cup sips of water and 4 ice chips, he started to exhibit an expiratory wheeze and bouts of coughing which caused him to grimace in pain and grab his forehead. His wife placed heart pillow (cardiac patient) when patient had to cough. SLP voiced concerns to wife regarding breathing, wheezing, coughing after PO's and she appeared understanding of risks. SLP is recommending patient continue NPO status and focus on oral care, keeping oral mucosa moist and SLP will continue to follow for PO readiness.   HPI HPI: Pt is a 75 y.o. male who presented to the ED on 1/23 after STEMI/cardiac arrest. Pt was intubated from 10/04/20 to 10/06/20. CT Head from 10/04/20 showed no acute intracranial abnormality and recent CXRs show no signs of infection. PMH of LBBB, multiple symptomatic meningiomas, CKD stage III, 2 prior pulmonary embolisms, HTN, pulmonary nodules, and bradycardia.      SLP  Plan  Continue with current plan of care       Recommendations  Diet recommendations: NPO Medication Administration: Via alternative means                Oral Care Recommendations: Oral care QID;Staff/trained caregiver to provide oral care Follow up Recommendations: Other (comment) (TBD pending progress) SLP Visit Diagnosis: Dysphagia, unspecified (R13.10) Plan: Continue with current plan of care       [place], MA, [place] Speech Therapy [facility] Acute Rehab

## 2020-10-10 NOTE — Plan of Care (Signed)
  Problem: Clinical Measurements: Goal: Will remain free from infection Outcome: Not Progressing   Problem: Clinical Measurements: Goal: Respiratory complications will improve Outcome: Progressing

## 2020-10-10 NOTE — Progress Notes (Signed)
Progress Note  Patient Name: Timothy Moran Date of Encounter: 10/10/2020  [facility] Cardiologist: Jenne Campus, MD   Subjective   Transferred on 10/08/2020 to ICU given increasing respiratory needs, delirium.  Inpatient Medications    Scheduled Meds: . aspirin  81 mg Per Tube Daily  . atorvastatin  80 mg Per Tube Daily  . chlorhexidine  15 mL Mouth Rinse BID  . Chlorhexidine Gluconate Cloth  6 each Topical Daily  . enoxaparin (LOVENOX) injection  30 mg Subcutaneous Q24H  . feeding supplement (PROSource TF)  45 mL Per Tube TID  . guaiFENesin  10 mL Per Tube Q4H  . insulin aspart  0-9 Units Subcutaneous Q4H  . mouth rinse  15 mL Mouth Rinse q12n4p  . sodium chloride flush  3 mL Intravenous Q12H  . Thrombi-Pad  1 each Topical Once  . ticagrelor  90 mg Per NG tube BID   Continuous Infusions: . sodium chloride Stopped (10/08/20 0943)  . amiodarone 30 mg/hr (10/10/20 0802)  . ceFEPime (MAXIPIME) IV Stopped (10/09/20 1002)  . dexmedetomidine (PRECEDEX) IV infusion 0.2 mcg/kg/hr (10/10/20 0802)  . feeding supplement (OSMOLITE 1.5 CAL) 30 mL/hr at 10/10/20 0600   PRN Meds: sodium chloride, acetaminophen, fentaNYL (SUBLIMAZE) injection, LORazepam, sodium chloride flush, white petrolatum   Vital Signs    Vitals:   10/10/20 0750 10/10/20 0756 10/10/20 0800 10/10/20 0804  BP:  135/78 (!) 156/95   Pulse:  77 81 79  Resp:  (!) 34 (!) 28 (!) 36  Temp: 97.6 F (36.4 C)     TempSrc:      SpO2:  98% 98% 99%  Weight:      Height:        Intake/Output Summary (Last 24 hours) at 10/10/2020 0819 Last data filed at 10/10/2020 0802 Gross per 24 hour  Intake 1858.69 ml  Output 1615 ml  Net 243.69 ml   Last 3 Weights 10/10/2020 10/09/2020 10/06/2020  Weight (lbs) 232 lb 5.8 oz 237 lb 10.5 oz 240 lb 4.8 oz  Weight (kg) 105.4 kg 107.8 kg 109 kg      Telemetry    Sinus rhythm, PVCs, PACs noted- Personally Reviewed  ECG    EKG on 1/28 showed sinus rhythm PACs with  left bundle branch block- Personally Reviewed  Physical Exam   GEN:  Laying in bed, conversant.  Seems more alert. Neck: No JVD high flow oxygen in place nasal cannula Cardiac: RRR, no murmurs, rubs, or gallops.  Respiratory:  Course bilaterally. GI: Soft, nontender, non-distended  MS: No edema; No deformity. Neuro:  Nonfocal  Psych: Normal affect   Labs    High Sensitivity Troponin:   Recent Labs  Lab 10/04/20 1808 10/04/20 2324 10/05/20 0500  TROPONINIHS 75* >27,000* >27,000*      Chemistry Recent Labs  Lab 10/08/20 0500 10/08/20 1635 10/09/20 0402 10/10/20 0311  NA 140 145 144 147*  K 3.9 3.6 3.6 3.5  CL 108 112* 114* 117*  CO2 18* 19* 19* 19*  GLUCOSE 234* 149* 164* 176*  BUN 48* 47* 51* 60*  CREATININE 2.97* 2.71* 2.70* 2.66*  CALCIUM 8.5* 8.3* 7.9* 8.1*  PROT 6.2*  --  5.3* 5.7*  ALBUMIN 3.1*  --  2.4* 2.4*  AST 107*  --  50* 41  ALT 83*  --  53* 51*  ALKPHOS 45  --  39 47  BILITOT 1.3*  --  1.4* 1.5*  GFRNONAA 21* 24* 24* 24*  ANIONGAP 14 14 11  11  Hematology Recent Labs  Lab 10/08/20 1635 10/09/20 0402 10/10/20 0436  WBC 13.9* 10.4 10.0  RBC 3.36* 3.17* 3.28*  HGB 10.3* 9.5* 9.8*  HCT 30.4* 29.1* 30.5*  MCV 90.5 91.8 93.0  MCH 30.7 30.0 29.9  MCHC 33.9 32.6 32.1  RDW 14.0 14.2 14.3  PLT 196 172 184    BNP Recent Labs  Lab 10/04/20 1809 10/08/20 1456  BNP 51.6 1,434.0*     DDimer No results for input(s): DDIMER in the last 168 hours.   Radiology    No results found.  Cardiac Studies   Cath 10/04/20 Conclusions: 1. Significant two-vessel coronary artery disease including thrombotic occlusion of the distal LCx, which is the culprit for the patient's inferior STEMI as well as sequential 50% and 80% proximal/mid LAD lesions. 2. Mildly elevated left ventricular filling pressure (LVEDP 20-25 mmHg). 3. Successful PCI to distal LCx using Resolute Onyx 2.5 x 30 mm drug-eluting stent complicated by no reflow treated with  intracoronary adenosine and nitroglycerin, as well as aggressive antiplatelet therapy. Final angiogram shows 0% residual stenosis with TIMI-2 flow.  Recommendations: 1. Admit to 2H-ICU for post STEMI/cardiac arrest monitoring. Consider targeted temperature management; critical care medicine has been consulted to assist with targeted temperature and ventilator management. 2. Continue tirofiban infusion for 6 hours. 3. Loaded with ticagrelor when patient arrives in ICU; patient should complete 12 months of dual antiplatelet therapy with aspirin and ticagrelor. 4. Obtain echocardiogram. 5. Aggressive secondary prevention of coronary artery disease including high intensity statin therapy. 6. Continue IV amiodarone overnight. 7. Consider staged PCI to LAD.  Echocardiogram 10/05/20 1. Since the study on 06/19/2020 LVEF has decreased, now severely impaired  at 20-25% and diffuse hypokinesis.  2. Left ventricular ejection fraction, by estimation, is 20 to 25%. The  left ventricle has severely decreased function. The left ventricle  demonstrates global hypokinesis. There is mild concentric left ventricular  hypertrophy. Left ventricular diastolic  parameters are consistent with Grade I diastolic dysfunction (impaired  relaxation).  3. Right ventricular systolic function is normal. The right ventricular  size is normal.  4. The mitral valve is normal in structure. Mild mitral valve  regurgitation. No evidence of mitral stenosis.  5. The aortic valve is normal in structure. Aortic valve regurgitation is  trivial. No aortic stenosis is present.  6. The inferior vena cava is normal in size with greater than 50%  respiratory variability, suggesting right atrial pressure of 3 mmHg.   Patient Profile     75 y.o. male with ventricular fib [place] cardiac arrest with inferior STEMI on 10/04/2020 with ischemic cardiomyopathy EF 20 to 25% with successful stent placement to circumflex, considering  staged PCI to LAD at a later date.  Complicated by increasing respiratory needs agitation delirium pneumonia acute kidney injury.  Assessment & Plan    Inferolateral STEMI/cardiac arrest/VF as initial rhythm -PCI to left circumflex.  Improved with nitro adenosine Aggrastat after initial no reflow.  EF now 20 to 25%.  He is on aspirin and ticagrelor, per NG tube -Consider staged PCI to LAD when can be done safely.  Kidney function agitation needs to improve. -Troponin greater than 27,000 -On atorvastatin.  LFTs are downtrending, elevated originally secondary to shock. -LDL 42, hemoglobin A1c 7.0 consistent with diabetes. -Amiodarone IV given VF arrest, post revascularization, will likely be able to discontinue.  No further episodes of VF noted. -Does not seem to be volume overloaded.  Left bundle branch block bradycardia -Heart rate seems to be improving. -  I will add low-dose Toprol XL 25 mg once a day.  Trying to slowly add goal-directed therapy.  Chest discomfort -Describes a red chest pain central spreading across chest that resolves fairly quickly.  Certainly could be cardiac.  Could also be GERD related with CorPak in place.  We will add isosorbide dinitrate 30 mg twice daily via NG tube.  Acute kidney injury on top of chronic kidney disease stage IIIb with baseline creatinine around 1.7 -Slowly resolving, creatinine gently decreasing.  Holding on angiotensin receptor blocker.  Blood pressure would allow Entresto when able.  Diabetes with coronary artery disease -Hemoglobin A1c 7.0.  Diabetic coordinator -Consider SGLT2 inhibitor once stable.  Acute blood loss anemia -Recently had some oozing around the central line site that had resolved.  Hemoglobin drifted.  No evidence of bleeding.  Monitoring closely.  Prior history of PEs -Plan was for 3 months of anticoagulation with Eliquis.  Per patient wife he discontinue the Eliquis at home by himself.  Dr. Wendy Poet note mentions PE  on 08/03/2020.  Acute chronic systolic heart failure -Was on valsartan as outpatient.  Add goal-directed medical therapy as he allows.  Renal function bradycardia blood pressure all inhibiting.  C-collar has been removed safely. Hypothermia protocol completed.  Critical care medicine on board.  CRITICAL CARE Performed by: Candee Furbish   Total critical care time: 40 minutes  Critical care time was exclusive of separately billable procedures and treating other patients.  Critical care was necessary to treat or prevent imminent or life-threatening deterioration.  Critical care was time spent personally by me on the following activities: development of treatment plan with patient and/or surrogate as well as nursing, discussions with consultants, evaluation of patient's response to treatment, examination of patient, obtaining history from patient or surrogate, ordering and performing treatments and interventions, ordering and review of laboratory studies, ordering and review of radiographic studies, pulse oximetry and re-evaluation of patient's condition.   For questions or updates, please contact [place] Please consult [email or web address] for contact info under        Signed, Candee Furbish, MD  10/10/2020, 8:19 AM

## 2020-10-10 NOTE — Progress Notes (Signed)
NAME:  Arbor Cohen, MRN:  [id number], DOB:  [birth year], LOS: 6 ADMISSION DATE:  10/04/2020, CONSULTATION DATE: October 05, 2019 REFERRING MD: Cardiology, CHIEF COMPLAINT: Cardiac arrest  Brief History:  75 year old male status post V. Fib cardiac arrest witnessed by wife, shocked 4 and 2 epi.  Patient was noted to have acute inferior/inferolateral STEMI, underwent cardiac cath with stent and circumflex came up unresponsive nonpurposeful movements initiated hypothermia protocol.  Pt was extubated and was improving from a respiratory standpoint and was transferred out of intensive care on 1/26, however overnight had worsening hypoxia and was started on Bipap.  CXR with likely pneumonia  Past Medical History:  History of meningioma, spinal tumor, benign lung nodules PET positive, chronic kidney disease stage IV, hypertension, hyperlipidemia, ascending aortic aneurysm and left bundle branch block  Significant Hospital Events:  1/23 cardiac cath with cardiac stent 1/24 hypothermia protocol 1/25 Extubated to nasal cannula 1/26 Transferred out of ICU, overnight worsening hypoxia and started on Bipap and abx 1/27 continue Bipap requirement, AMS though received Ativan/Haldol, transfer back to ICU  Consults:  Cardiology  Procedures:  1/23 cardiac cath with a stent in left circumflex 1/23 ETT > 1/25 1/23 right subclavian CVC  Micro: 1/27 BCx2>> ngtd 1/27 UC>> neg 1/27 Sputum culture: not provided  Antibiotics: Vancomycin 1/27-1/28 Cefepime 1/27-  INTERVAL EVENTS:  1/29: improved today, weaning HHFNC and oob in chair. Conversant. C/o chest pain esp with cough. Wife at bedside and endorses mentation improved as well as resp status.  1/28: pt severely tachypneic, delirious on amio and precedex. On hhfnc. Wife at bedside. Aggressive goals. Close monitoring as may warrant ett today with wob but wife states this is what he appeared like yesterday as well. Not a great candidate for NIV 2/2  pna and delirium/restraints and wife endorses pt does not tolerate it well.  Objective   Blood pressure (!) 156/95, pulse 90, temperature 98.8 F (37.1 C), temperature source Oral, resp. rate (!) 23, height 6\' 1"  (1.854 m), weight 105.4 kg, SpO2 96 %.    FiO2 (%):  [60 %-70 %] 60 %   Intake/Output Summary (Last 24 hours) at 10/10/2020 1134 Last data filed at 10/10/2020 1009 Gross per 24 hour  Intake 1632.68 ml  Output 1640 ml  Net -7.32 ml   Filed Weights   10/06/20 0500 10/09/20 0440 10/10/20 0330  Weight: 109 kg 107.8 kg 105.4 kg    General:  Elderly M, awake oob in chair with hhfnc in place HEENT: MM pink/dry, NCAT EOMI, perrl Neuro: no focal deficits, a bit restless in the chair but able to pusyh self back in chair CV: s1s2 rrr, no m/r/g PULM:  Decreased air movement and audible wheeze bilaterally GI: soft, bsx4 active  Extremities: warm/dry, no edema  Skin: no rashes or lesions     Assessment & Plan:    Acute hypoxic/hypercapnic respiratory failure - s/p intubation and extubation.  Now with multifocal infiltrates, likely developing VAP P: -tmax 100.7 -neg mrsa pcr so will stop vanc -cont cefepime 7 days.  -considerable improvement from 11/28 to 11/29 -on precedex but less today to 0.2 -on [facility] - ICU for close monitoring -NPO, possible some element of aspiration -diurese per cardiology   Acute inferior/inferior lateral ST elevation MI s/p DES. Status post V. fib cardiac arrest - s/p hypothermia protocol to 36 degrees. Hx LBB, Cardiomyopathy (EF now 20-25% on echo 1/24), HTN.  P: -Cardiology managing, considering staged PCI to LAD -continue ASA, Ticagrelor and Amiodarone  -Severely  worsened EF compared to prior, 20-25%   Acute kidney injury on CKD stage IIIa  P: -- Renal function stable today  -baseline Cr appears to be ~1.7 -continue to follow BMP -appears euvolemic on exam, further diuresis deferred to cardiology Hypernatremia:  -rising  sodium -follow may need to increase fwf if cont to rise  Acute blood loss anemia from oozing CVL site - improved. - Hgb stable today  Hx PE - had supposedly been prescribed APixaban x 3 months but not listed on MAR. -per cards note pt was to be anticoagulated for 3 months but self-discontinued   Dm2:  -a1c 7 -monitor bs     CRITICAL CARE Performed by: Audria Nine   Total critical care time: 34 minutes  Critical care time was exclusive of separately billable procedures and treating other patients.  Critical care was necessary to treat or prevent imminent or life-threatening deterioration.  Critical care was time spent personally by me on the following activities: development of treatment plan with patient and/or surrogate as well as nursing, discussions with consultants, evaluation of patient's response to treatment, examination of patient, obtaining history from patient or surrogate, ordering and performing treatments and interventions, ordering and review of laboratory studies, ordering and review of radiographic studies, pulse oximetry and re-evaluation of patient's condition.

## 2020-10-11 DIAGNOSIS — J9601 Acute respiratory failure with hypoxia: Secondary | ICD-10-CM | POA: Diagnosis not present

## 2020-10-11 LAB — BASIC METABOLIC PANEL
Anion gap: 12 (ref 5–15)
BUN: 63 mg/dL — ABNORMAL HIGH (ref 8–23)
CO2: 20 mmol/L — ABNORMAL LOW (ref 22–32)
Calcium: 8 mg/dL — ABNORMAL LOW (ref 8.9–10.3)
Chloride: 121 mmol/L — ABNORMAL HIGH (ref 98–111)
Creatinine, Ser: 2.66 mg/dL — ABNORMAL HIGH (ref 0.61–1.24)
GFR, Estimated: 24 mL/min — ABNORMAL LOW (ref >60)
Glucose, Bld: 195 mg/dL — ABNORMAL HIGH (ref 70–99)
Potassium: 3.6 mmol/L (ref 3.5–5.1)
Sodium: 153 mmol/L — ABNORMAL HIGH (ref 135–145)

## 2020-10-11 LAB — GLUCOSE, CAPILLARY
Glucose-Capillary: 184 mg/dL — ABNORMAL HIGH (ref 70–99)
Glucose-Capillary: 159 mg/dL — ABNORMAL HIGH (ref 70–99)
Glucose-Capillary: 197 mg/dL — ABNORMAL HIGH (ref 70–99)
Glucose-Capillary: 180 mg/dL — ABNORMAL HIGH (ref 70–99)
Glucose-Capillary: 185 mg/dL — ABNORMAL HIGH (ref 70–99)
Glucose-Capillary: 184 mg/dL — ABNORMAL HIGH (ref 70–99)

## 2020-10-11 MED ORDER — ACETAMINOPHEN 160 MG/5ML PO SOLN
650.0000 mg | Freq: Four times a day (QID) | ORAL | Status: DC | PRN
  Administered 2020-10-11 – 2020-10-16 (×8): 650 mg
  Filled 2020-10-11 (×8): qty 20.3

## 2020-10-11 MED ORDER — SENNOSIDES-DOCUSATE SODIUM 8.6-50 MG PO TABS
1.0000 | ORAL_TABLET | Freq: Two times a day (BID) | ORAL | Status: DC
  Administered 2020-10-11 – 2020-10-19 (×8): 1
  Filled 2020-10-11 (×9): qty 1

## 2020-10-11 MED ORDER — FREE WATER
200.0000 mL | Freq: Four times a day (QID) | Status: DC
  Administered 2020-10-11 – 2020-10-13 (×8): 200 mL

## 2020-10-11 MED ORDER — MELATONIN 3 MG PO TABS
3.0000 mg | ORAL_TABLET | Freq: Every evening | ORAL | Status: DC | PRN
  Administered 2020-10-11 – 2020-10-15 (×3): 3 mg
  Filled 2020-10-11 (×4): qty 1

## 2020-10-11 MED ORDER — OXYCODONE HCL 5 MG/5ML PO SOLN
5.0000 mg | Freq: Four times a day (QID) | ORAL | Status: DC | PRN
  Administered 2020-10-11 – 2020-10-16 (×11): 5 mg
  Filled 2020-10-11 (×11): qty 5

## 2020-10-11 NOTE — Progress Notes (Addendum)
Pharmacy Antibiotic Note  Timothy Moran is a 75 y.o. male with possible pneumonia.  Pharmacy has been consulted for cefepime dosing. Received 1 dose of vancomycin on 1/27. WBC wnl now at 10. Afebrile. Scr stable 2.66, CrCl 30. Will continue cefepime dose for now given borderline renal function with low threshold to adjust to Q12hr dosing if renal function improves.  Plan: -Continue cefepime 2g IV q24 hrs for 7 days through 2/2 -Monitor renal function, clinical progression, cultures/sensitivities   Height: 6\' 1"  (185.4 cm) Weight: 104.8 kg (231 lb 0.7 oz) IBW/kg (Calculated) : 79.9  Temp (24hrs), Avg:98.3 F (36.8 C), Min:97.9 F (36.6 C), Max:98.8 F (37.1 C)  Recent Labs  Lab 10/07/20 1224 10/07/20 2337 10/08/20 0500 10/08/20 0520 10/08/20 1635 10/09/20 0402 10/10/20 0311 10/10/20 0436 10/11/20 0413  WBC 10.9*  --  15.3*  --  13.9* 10.4  --  10.0  --   CREATININE 2.87*  --  2.97*  --  2.71* 2.70* 2.66*  --  2.66*  LATICACIDVEN  --  2.9*  --  2.5*  --   --   --   --   --     Estimated Creatinine Clearance: 30 mL/min (A) (by C-G formula based on SCr of 2.66 mg/dL (H)).    No Known Allergies  Antimicrobials this admission: 1/27 vanc x1 1/27 cefepime>>  Dose adjustments this admission: None  Microbiology results: 1/27 BCxL ngtd 1/27 UCx: negF 1/27 MRSA PCR neg  Richardine Service, PharmD, BCPS PGY2 Cardiology Pharmacy Resident Phone: [phone] 10/11/2020  9:54 AM  Please check [email or web address] for unit-specific pharmacy phone numbers.

## 2020-10-11 NOTE — Progress Notes (Signed)
NAME:  Timothy Moran, MRN:  [id number], DOB:  08-Oct-1945, LOS: 7 ADMISSION DATE:  10/04/2020, CONSULTATION DATE: October 05, 2019 REFERRING MD: Cardiology, CHIEF COMPLAINT: Cardiac arrest  Brief History:  75 year old male status post V. Fib cardiac arrest witnessed by wife, shocked 4 and 2 epi.  Patient was noted to have acute inferior/inferolateral STEMI, underwent cardiac cath with stent and circumflex came up unresponsive nonpurposeful movements initiated hypothermia protocol.  Pt was extubated and was improving from a respiratory standpoint and was transferred out of intensive care on 1/26, however overnight had worsening hypoxia and was started on Bipap.  CXR with likely pneumonia  Past Medical History:  History of meningioma, spinal tumor, benign lung nodules PET positive, chronic kidney disease stage IV, hypertension, hyperlipidemia, ascending aortic aneurysm and left bundle branch block  Significant Hospital Events:  1/23 cardiac cath with cardiac stent 1/24 hypothermia protocol 1/25 Extubated to nasal cannula 1/26 Transferred out of ICU, overnight worsening hypoxia and started on Bipap and abx 1/27 continue Bipap requirement, AMS though received Ativan/Haldol, transfer back to ICU  Consults:  Cardiology  Procedures:  1/23 cardiac cath with a stent in left circumflex 1/23 ETT > 1/25 1/23 right subclavian CVC  Micro: 1/27 BCx2>> ngtd 1/27 UC>> neg 1/27 Sputum culture: not provided  Antibiotics: Vancomycin 1/27-1/28 Cefepime 1/27-  INTERVAL EVENTS:  1/30: pt is continuing to improve. HHFNC can transition to [place]. Mentation cont to improve as well, hopefully will be able to wean off precedex today. Addition of oxy benefited pt for sternal pain.  1/29: improved today, weaning HHFNC and oob in chair. Conversant. C/o chest pain esp with cough. Wife at bedside and endorses mentation improved as well as resp status.  1/28: pt severely tachypneic, delirious on amio and  precedex. On hhfnc. Wife at bedside. Aggressive goals. Close monitoring as may warrant ett today with wob but wife states this is what he appeared like yesterday as well. Not a great candidate for NIV 2/2 pna and delirium/restraints and wife endorses pt does not tolerate it well.  Objective   Blood pressure (!) 102/55, pulse 64, temperature 98.5 F (36.9 C), resp. rate (!) 24, height 6\' 1"  (1.854 m), weight 104.8 kg, SpO2 100 %.    FiO2 (%):  [40 %-50 %] 40 %   Intake/Output Summary (Last 24 hours) at 10/11/2020 1023 Last data filed at 10/11/2020 0901 Gross per 24 hour  Intake 1782.46 ml  Output 1270 ml  Net 512.46 ml   Filed Weights   10/09/20 0440 10/10/20 0330 10/11/20 0449  Weight: 107.8 kg 105.4 kg 104.8 kg    General:  Elderly M, awake in bed conversant and appropriate on low dose precedex infusion. Wife at bedside  HEENT: MM pink/moist, NCAT EOMI, perrl Neuro: no focal deficits, impulsive otherwise CV: s1s2 rrr, no m/r/g PULM:  Diminished mild wheeze GI: soft, bsx4 active  Extremities: warm/dry, no edema  Skin: no rashes or lesions     Assessment & Plan:    Acute hypoxic/hypercapnic respiratory failure - s/p intubation and extubation.  Now with multifocal infiltrates, likely developing VAP P: -tmax 98.8 -cont cefepime 7 days total -cont improvement -on precedex but mentation cont to improve so hopefully will be able to stop today.  -on HHFNC, decreased to 40% and 15L. Will transition to HFNC - ICU for precedex wean -NPO, possible some element of aspiration -diurese per cardiology    Acute inferior/inferior lateral ST elevation MI s/p DES. Status post V. fib cardiac arrest - s/p  hypothermia protocol to 36 degrees. Hx LBB, Cardiomyopathy (EF now 20-25% on echo 1/24), HTN.  P: -Cardiology managing, considering staged PCI to LAD -continue ASA, Ticagrelor and Amiodarone  -Severely worsened EF compared to prior, 20-25%   Acute kidney injury on CKD stage IIIa   P:  -baseline Cr appears to be ~1.7 -continue to follow BMP, renal function stagnant at 2.6 and BUN in 60's.  -appears euvolemic Hypernatremia:  -rising sodium -fwf to 200q6  Acute blood loss anemia from oozing CVL site - improved. - Hgb stable today  Hx PE - had supposedly been prescribed APixaban x 3 months but not listed on MAR. -per cards note pt was to be anticoagulated for 3 months but self-discontinued   Dm2:  -a1c 7 -monitor bs     CRITICAL CARE Performed by: Audria Nine   Total critical care time: 32 minutes  Critical care time was exclusive of separately billable procedures and treating other patients.  Critical care was necessary to treat or prevent imminent or life-threatening deterioration.  Critical care was time spent personally by me on the following activities: development of treatment plan with patient and/or surrogate as well as nursing, discussions with consultants, evaluation of patient's response to treatment, examination of patient, obtaining history from patient or surrogate, ordering and performing treatments and interventions, ordering and review of laboratory studies, ordering and review of radiographic studies, pulse oximetry and re-evaluation of patient's condition.

## 2020-10-11 NOTE — Progress Notes (Signed)
Inpatient Rehab Admissions Coordinator Note:   Per therapy recommendations, pt was screened for CIR candidacy by Cecilia Nishikawa, MS CCC-SLP. At this time, Pt. Appears to have functional decline and is a good candidate for CIR. Will request order for rehab consult per protocol.  Please contact me with questions.   Neesha Langton, MS, CCC-SLP Rehab Admissions Coordinator  [phone] (celll) [phone] (office)  

## 2020-10-11 NOTE — Progress Notes (Signed)
[place] Progress Note Patient Name: Timothy Moran DOB: 02-18-1946 MRN: [id number]   Date of Service  10/11/2020  HPI/Events of Note  Request for sleep aid.  eICU Interventions  Plan: 1. Melatonin 3 mg per tube Q HS PRN sleep.      Intervention Category Major Interventions: Other:  Lysle Dingwall 10/11/2020, 7:59 PM

## 2020-10-11 NOTE — Progress Notes (Signed)
.   Progress Note  Patient Name: Timothy Moran Date of Encounter: 10/11/2020  [facility] Cardiologist: Jenne Campus, MD   Subjective   Speech evaluated yesterday.  Requested continued n.p.o. status.  They will continue to follow. Still trying to get up from the bed. His wife is here. Good conversation. Would like for him to try to be home at some point obviously she states. Has been experiencing some chest discomfort. Isosorbide added. Possibly MSK from resuscitative efforts. Pain medication has been added per critical care.  Inpatient Medications    Scheduled Meds: . aspirin  81 mg Per Tube Daily  . atorvastatin  80 mg Per Tube Daily  . chlorhexidine  15 mL Mouth Rinse BID  . Chlorhexidine Gluconate Cloth  6 each Topical Daily  . enoxaparin (LOVENOX) injection  30 mg Subcutaneous Q24H  . feeding supplement (PROSource TF)  45 mL Per Tube TID  . guaiFENesin  10 mL Per Tube Q4H  . insulin aspart  0-9 Units Subcutaneous Q4H  . isosorbide dinitrate  30 mg Per NG tube BID  . mouth rinse  15 mL Mouth Rinse q12n4p  . sodium chloride flush  3 mL Intravenous Q12H  . Thrombi-Pad  1 each Topical Once  . ticagrelor  90 mg Per NG tube BID   Continuous Infusions: . sodium chloride 10 mL/hr at 10/11/20 0800  . amiodarone 30 mg/hr (10/11/20 0800)  . ceFEPime (MAXIPIME) IV Stopped (10/10/20 0955)  . dexmedetomidine (PRECEDEX) IV infusion 0.4 mcg/kg/hr (10/11/20 0800)  . feeding supplement (OSMOLITE 1.5 CAL) 1,000 mL (10/10/20 1855)   PRN Meds: sodium chloride, acetaminophen (TYLENOL) oral liquid 160 mg/5 mL, fentaNYL (SUBLIMAZE) injection, LORazepam, oxyCODONE, sodium chloride flush, white petrolatum   Vital Signs    Vitals:   10/11/20 0700 10/11/20 0737 10/11/20 0753 10/11/20 0800  BP: (!) 145/76   (!) 142/72  Pulse: 63 66  70  Resp: (!) 27 (!) 23  18  Temp:   98.5 F (36.9 C)   TempSrc:      SpO2: 95% 96%  97%  Weight:      Height:        Intake/Output Summary  (Last 24 hours) at 10/11/2020 0843 Last data filed at 10/11/2020 0800 Gross per 24 hour  Intake 1691.87 ml  Output 1445 ml  Net 246.87 ml   Last 3 Weights 10/11/2020 10/10/2020 10/09/2020  Weight (lbs) 231 lb 0.7 oz 232 lb 5.8 oz 237 lb 10.5 oz  Weight (kg) 104.8 kg 105.4 kg 107.8 kg      Telemetry    Left bundle branch block, no VT, PACs noted- Personally Reviewed  ECG    Sinus rhythm with PACs left bundle branch block 79 bpm- Personally Reviewed  Physical Exam   GEN:  In bed, somewhat sleepy, confused, wife in room. Neck: No JVD, nasal cannula noted Cardiac: RRR, no murmurs, rubs, or gallops. Occasional ectopy Respiratory: Clear to auscultation bilaterally. GI: Soft, nontender, non-distended  MS: No edema; No deformity. Neuro:  Nonfocal  Psych: Normal affect   Labs    High Sensitivity Troponin:   Recent Labs  Lab 10/04/20 1808 10/04/20 2324 10/05/20 0500  TROPONINIHS 75* >27,000* >27,000*      Chemistry Recent Labs  Lab 10/08/20 0500 10/08/20 1635 10/09/20 0402 10/10/20 0311 10/11/20 0413  NA 140   < > 144 147* 153*  K 3.9   < > 3.6 3.5 3.6  CL 108   < > 114* 117* 121*  CO2 18*   < >  19* 19* 20*  GLUCOSE 234*   < > 164* 176* 195*  BUN 48*   < > 51* 60* 63*  CREATININE 2.97*   < > 2.70* 2.66* 2.66*  CALCIUM 8.5*   < > 7.9* 8.1* 8.0*  PROT 6.2*  --  5.3* 5.7*  --   ALBUMIN 3.1*  --  2.4* 2.4*  --   AST 107*  --  50* 41  --   ALT 83*  --  53* 51*  --   ALKPHOS 45  --  39 47  --   BILITOT 1.3*  --  1.4* 1.5*  --   GFRNONAA 21*   < > 24* 24* 24*  ANIONGAP 14   < > 11 11 12    < > = values in this interval not displayed.     Hematology Recent Labs  Lab 10/08/20 1635 10/09/20 0402 10/10/20 0436  WBC 13.9* 10.4 10.0  RBC 3.36* 3.17* 3.28*  HGB 10.3* 9.5* 9.8*  HCT 30.4* 29.1* 30.5*  MCV 90.5 91.8 93.0  MCH 30.7 30.0 29.9  MCHC 33.9 32.6 32.1  RDW 14.0 14.2 14.3  PLT 196 172 184    BNP Recent Labs  Lab 10/04/20 1809 10/08/20 1456  BNP 51.6  1,434.0*     DDimer No results for input(s): DDIMER in the last 168 hours.   Radiology    No results found.  Cardiac Studies    Cath 10/04/20 Conclusions: 1. Significant two-vessel coronary artery disease including thrombotic occlusion of the distal LCx, which is the culprit for the patient's inferior STEMI as well as sequential 50% and 80% proximal/mid LAD lesions. 2. Mildly elevated left ventricular filling pressure (LVEDP 20-25 mmHg). 3. Successful PCI to distal LCx using Resolute Onyx 2.5 x 30 mm drug-eluting stent complicated by no reflow treated with intracoronary adenosine and nitroglycerin, as well as aggressive antiplatelet therapy. Final angiogram shows 0% residual stenosis with TIMI-2 flow.  Recommendations: 1. Admit to 2H-ICU for post STEMI/cardiac arrest monitoring. Consider targeted temperature management; critical care medicine has been consulted to assist with targeted temperature and ventilator management. 2. Continue tirofiban infusion for 6 hours. 3. Loaded with ticagrelor when patient arrives in ICU; patient should complete 12 months of dual antiplatelet therapy with aspirin and ticagrelor. 4. Obtain echocardiogram. 5. Aggressive secondary prevention of coronary artery disease including high intensity statin therapy. 6. Continue IV amiodarone overnight. 7. Consider staged PCI to LAD.  Echocardiogram 10/05/20 1. Since the study on 06/19/2020 LVEF has decreased, now severely impaired  at 20-25% and diffuse hypokinesis.  2. Left ventricular ejection fraction, by estimation, is 20 to 25%. The  left ventricle has severely decreased function. The left ventricle  demonstrates global hypokinesis. There is mild concentric left ventricular  hypertrophy. Left ventricular diastolic  parameters are consistent with Grade I diastolic dysfunction (impaired  relaxation).  3. Right ventricular systolic function is normal. The right ventricular  size is normal.  4. The  mitral valve is normal in structure. Mild mitral valve  regurgitation. No evidence of mitral stenosis.  5. The aortic valve is normal in structure. Aortic valve regurgitation is  trivial. No aortic stenosis is present.  6. The inferior vena cava is normal in size with greater than 50%  respiratory variability, suggesting right atrial pressure of 3 mmHg.    Patient Profile     75 y.o. male  ventricular fib [place] cardiac arrest with inferior STEMI on 10/04/2020 with ischemic cardiomyopathy EF 20 to 25% with successful stent  placement to circumflex, considering staged PCI to LAD at a later date.  Complicated by increasing respiratory needs agitation delirium pneumonia acute kidney injury.   Assessment & Plan     Inferolateral STEMI/cardiac arrest/VF as initial rhythm -PCI to left circumflex.  Improved with nitro adenosine Aggrastat after initial no reflow.  EF now 20 to 25%.  He is on aspirin and ticagrelor, per NG tube -Consider staged PCI to LAD when can be done safely.  Kidney function agitation needs to improve. -Troponin greater than 27,000 -On atorvastatin.  LFTs are downtrending, elevated originally secondary to shock. -LDL 42, hemoglobin A1c 7.0 consistent with diabetes. -Amiodarone IV given VF arrest, post revascularization, will likely be able to discontinue.    Consider tomorrow.  No further episodes of VF noted. -Does not seem to be volume overloaded.  Stable.  Left bundle branch block bradycardia -Heart rate seems to be improving. -I added low-dose Toprol XL 25 mg 1/29.  Trying to slowly add goal-directed therapy.  Seems stable.  Chest discomfort -Describes a red chest pain central spreading across chest that resolves fairly quickly.   Could be musculoskeletal secondary to resuscitative efforts.  Could also be GERD related with CorPak in place.    Added on 1/29 isosorbide dinitrate 30 mg twice daily via NG tube.   Acute kidney injury on top of chronic kidney disease  stage IIIb with baseline creatinine around 1.7, continues to be about 2.6 -Slowly resolving, creatinine gently decreasing.  Holding on angiotensin receptor blocker.  Blood pressure would allow Entresto when able.  Diabetes with coronary artery disease -Hemoglobin A1c 7.0.  Diabetic coordinator -Consider SGLT2 inhibitor once stable.  Acute blood loss anemia -Recently had some oozing around the central line site that had resolved.  Hemoglobin drifted.  No evidence of bleeding.  Monitoring closely.  Prior history of PEs -Plan was for 3 months of anticoagulation with Eliquis.  Per patient wife he discontinue the Eliquis at home by himself.  Dr. Wendy Poet note mentions PE on 08/03/2020.  Acute chronic systolic heart failure -Was on valsartan as outpatient.  Add goal-directed medical therapy as he allows.  Renal function bradycardia blood pressure all inhibiting.  C-collar has been removed safely. Hypothermia protocol completed.  Critical care medicine on board. Discussed case. Continuing to work on speech therapy efforts. Swelling.  For questions or updates, please contact [place] Please consult [email or web address] for contact info under        Signed, Candee Furbish, MD  10/11/2020, 8:43 AM

## 2020-10-12 DIAGNOSIS — N179 Acute kidney failure, unspecified: Secondary | ICD-10-CM | POA: Diagnosis not present

## 2020-10-12 DIAGNOSIS — J9601 Acute respiratory failure with hypoxia: Secondary | ICD-10-CM | POA: Diagnosis not present

## 2020-10-12 DIAGNOSIS — I469 Cardiac arrest, cause unspecified: Secondary | ICD-10-CM | POA: Diagnosis not present

## 2020-10-12 DIAGNOSIS — N1831 Chronic kidney disease, stage 3a: Secondary | ICD-10-CM | POA: Diagnosis not present

## 2020-10-12 LAB — GLUCOSE, CAPILLARY
Glucose-Capillary: 115 mg/dL — ABNORMAL HIGH (ref 70–99)
Glucose-Capillary: 135 mg/dL — ABNORMAL HIGH (ref 70–99)
Glucose-Capillary: 151 mg/dL — ABNORMAL HIGH (ref 70–99)
Glucose-Capillary: 180 mg/dL — ABNORMAL HIGH (ref 70–99)
Glucose-Capillary: 200 mg/dL — ABNORMAL HIGH (ref 70–99)
Glucose-Capillary: 246 mg/dL — ABNORMAL HIGH (ref 70–99)

## 2020-10-12 LAB — BASIC METABOLIC PANEL
Anion gap: 11 (ref 5–15)
BUN: 59 mg/dL — ABNORMAL HIGH (ref 8–23)
CO2: 19 mmol/L — ABNORMAL LOW (ref 22–32)
Calcium: 8.2 mg/dL — ABNORMAL LOW (ref 8.9–10.3)
Chloride: 123 mmol/L — ABNORMAL HIGH (ref 98–111)
Creatinine, Ser: 2.39 mg/dL — ABNORMAL HIGH (ref 0.61–1.24)
GFR, Estimated: 27 mL/min — ABNORMAL LOW (ref >60)
Glucose, Bld: 122 mg/dL — ABNORMAL HIGH (ref 70–99)
Potassium: 4.1 mmol/L (ref 3.5–5.1)
Sodium: 153 mmol/L — ABNORMAL HIGH (ref 135–145)

## 2020-10-12 LAB — CBC
HCT: 31.6 % — ABNORMAL LOW (ref 39.0–52.0)
Hemoglobin: 9.8 g/dL — ABNORMAL LOW (ref 13.0–17.0)
MCH: 30.2 pg (ref 26.0–34.0)
MCHC: 31 g/dL (ref 30.0–36.0)
MCV: 97.5 fL (ref 80.0–100.0)
Platelets: 243 10*3/uL (ref 150–400)
RBC: 3.24 MIL/uL — ABNORMAL LOW (ref 4.22–5.81)
RDW: 14.8 % (ref 11.5–15.5)
WBC: 16.1 10*3/uL — ABNORMAL HIGH (ref 4.0–10.5)
nRBC: 0 % (ref 0.0–0.2)

## 2020-10-12 LAB — PHOSPHORUS: Phosphorus: 3.5 mg/dL (ref 2.5–4.6)

## 2020-10-12 LAB — MAGNESIUM: Magnesium: 2.2 mg/dL (ref 1.7–2.4)

## 2020-10-12 MED ORDER — BISACODYL 10 MG RE SUPP
10.0000 mg | Freq: Every day | RECTAL | Status: DC
  Administered 2020-10-12: 10 mg via RECTAL
  Filled 2020-10-12: qty 1

## 2020-10-12 MED ORDER — SORBITOL 70 % SOLN
30.0000 mL | Freq: Every day | Status: DC
  Administered 2020-10-12 – 2020-10-19 (×3): 30 mL
  Filled 2020-10-12 (×7): qty 30

## 2020-10-12 MED ORDER — ALBUTEROL SULFATE (2.5 MG/3ML) 0.083% IN NEBU
2.5000 mg | INHALATION_SOLUTION | Freq: Four times a day (QID) | RESPIRATORY_TRACT | Status: DC | PRN
  Administered 2020-10-16: 2.5 mg via RESPIRATORY_TRACT
  Filled 2020-10-12 (×2): qty 3

## 2020-10-12 MED ORDER — MORPHINE SULFATE (PF) 2 MG/ML IV SOLN
2.0000 mg | INTRAVENOUS | Status: DC | PRN
  Administered 2020-10-12 – 2020-10-19 (×3): 2 mg via INTRAVENOUS
  Filled 2020-10-12 (×3): qty 1

## 2020-10-12 NOTE — Clinical Note (Incomplete)
CC: Vfib arrest/STEMI HPI: Transferred to ICU yesterday given persistent need for bipap, delerium. ROS: NPO   Problem 1 - Hypoxic Respiratory distress Assessment: CXR consistent with pneumonia. HFNC  -D/c vanc (1/28)  - Day : cefepime 2g qd x7D (1/27>2/2)  MRSA PCR: neg  Febrile/Tmax: 97.6 (1/31)/ WBC wnl   Procal: dwn trnd (1/31)  Lactic acidosis: 2.5 - robitussin 89mL q4h (1/28>)  Plan  Problem 2 - s/p STEMI/ Afib arrest  Assessment:  BP stbl/ HR stbl / echo 20-25% Consider staged PCI to LAD during this admit if renal fxn improves  -bASA, brilinta, & amio ator80, Imdur 30  Vitals stbl (1/31)  I/O's: 1.8L Plan:    Problem 3- AKI on CKD stage IIIa -baseline Scr:~1.7 > 2.6 -BUN 60's.  Hypernatremia: (153) (up trnd) (1/31)  Free water 252mL q6h  K: 3.6 - neph monitoring Plan: inc free water fluid   Problem 4 - Glucose ctrl/ DM - A1c: 7   Sensitive SS aspart 0-9 tid ac  BG:200's (1/31)   Cortrak plced Plan:   Best Practice:   1. DVT ppx:   - Lovenox 30mg  q24h   CrCl: 30/plt stbl/ hgb dwn trnd but stbl 2. Infusions:   -amio gtt 30mg /hr  -precedex 10.9-32.51mL/hr 3. Bowel mvmt: senokot,   Last bowel mvmt: 1/30 @1000  4. Melatonin 3mg  prn

## 2020-10-12 NOTE — Progress Notes (Signed)
  Speech Language Pathology Treatment: Dysphagia  Patient Details Name: Timothy Moran MRN: [id number] DOB: 1946-01-07 Today's Date: 10/12/2020 Time: 2671-2458 SLP Time Calculation (min) (ACUTE ONLY): 10 min  Assessment / Plan / Recommendation Clinical Impression  Pt continues to present with concerns for adequate airway protection when swallowing.  HOB elevated; oral mucosa in better condition. Pt talkative, asking his wife questions about his cardiac arrest.  He consumed ice chips, which elicited consistent weak coughing then wheezing.  Voice quality is good, and RR was well under the threshold that would be concerning for respiratory/swallow dyssynchrony, however he still presents with concerns for aspiration.  Continue to keep NPO except occasional ice chips (no water); continue cortrak for nutrition.  SLP will follow for readiness for PO diet vs instrumental testing. D/W RN, wife.     HPI HPI: Pt is a 75 y.o. male who presented to the ED on 1/23 after STEMI/cardiac arrest. Pt was intubated from 10/04/20 to 10/06/20. CT Head from 10/04/20 showed no acute intracranial abnormality and recent CXRs show no signs of infection. PMH of LBBB, multiple symptomatic meningiomas, CKD stage III, 2 prior pulmonary embolisms, HTN, pulmonary nodules, and bradycardia.      SLP Plan  Continue with current plan of care       Recommendations  Diet recommendations: NPO Medication Administration: Via alternative means                Oral Care Recommendations: Oral care QID Follow up Recommendations: Other (comment) (tbd) Plan: Continue with current plan of care       GO              Timothy Moran L. Tivis Ringer, [place] CCC/SLP Acute Rehabilitation Services Office number [phone] Pager [phone]   Timothy Moran Laurice 10/12/2020, 10:07 AM

## 2020-10-12 NOTE — Progress Notes (Signed)
Inpatient Rehabilitation Admissions Coordinator  Inpatient rehab consult received. I met with patient with his wife at bedside. We discussed goals and expectations of a possible [facility] admit. They are in agreement. Patient noted to be very restless. I will follow his progress. I feel he could benefit from admit to [facility] when medically ready.  Danne Baxter, RN, MSN Rehab Admissions Coordinator [phone] 10/12/2020 1:12 PM

## 2020-10-12 NOTE — Plan of Care (Signed)
  Problem: Education: Goal: Knowledge of General Education information will improve Description: Including pain rating scale, medication(s)/side effects and non-pharmacologic comfort measures Outcome: Progressing   Problem: Clinical Measurements: Goal: Respiratory complications will improve Outcome: Progressing   Problem: Activity: Goal: Ability to return to baseline activity level will improve Outcome: Progressing

## 2020-10-12 NOTE — Progress Notes (Signed)
Occupational Therapy Treatment Patient Details Name: Timothy Moran MRN: [id number] DOB: [birth year] Today's Date: 10/12/2020    History of present illness 75 year old male with a history of LBBB, mildly reduced LVEF, 4.2cm TAA, multiple symptomatic meningiomas (possible NF2 suspected), CKD stage III, two prior pulmonary embolisms (now off Eliquis), HTN, and pulmonary nodules, who presented as a resuscitated VF arrest at home and was found to have inferior ST elevation on ECG. He began having chest pain while shoveling snow this afternoon. He collapsed and lost consciousness with no obvious injuries in the fall. No bystander CPR performed. He was found with agonal breathing and an AED-shockable rhythm. He received a total of 4 defibrillations by fire and EMS.  Patient is more alert, but remains confused with decreased awareness and safety.   OT comments  This 75 yo male admitted with above seen today with focus on increasing mobility and independence with transfers for basic ADLs. Pt sit<>stand min A from recliner, Mod A from chair without arms, Min-Mod A to ambulate with RW with A x2 to regain balance while ambulating (once when he took one hand off of RW). DOE only allowed him to go 12 feet before he needed to stop and cued to purse lip breath. Then able to go another 12 feet. Unable to get to feet at this time due to pain in chest from CPR. Pt also impulsive at times. D/C recommendation changed to CIR, will continue to follow.  Follow Up Recommendations  CIR;Supervision/Assistance - 24 hour    Equipment Recommendations  Tub/shower seat       Precautions / Restrictions Precautions Precautions: Fall Precaution Comments: watch O2 sats (tends to get SOB with activity but sats did not drop more than to 90% with ambulation in room) Restrictions Weight Bearing Restrictions: No       Mobility Bed Mobility               General bed mobility comments: Pt up in recliner upon my  arrival  Transfers Overall transfer level: Needs assistance Equipment used: Rolling walker (2 wheeled) Transfers: Sit to/from Stand Sit to Stand: Min assist;Mod assist         General transfer comment: Min A from recliner, Mod A from chair without arms. Ambulated in room 12 feet x2 with LOB x2 and needed A to correct    Balance Overall balance assessment: Needs assistance Sitting-balance support: No upper extremity supported;Feet supported Sitting balance-Leahy Scale: Fair     Standing balance support: Bilateral upper extremity supported Standing balance-Leahy Scale: Poor Standing balance comment: reliant on RW and external A                           ADL either performed or assessed with clinical judgement   ADL Overall ADL's : Independent                           Toilet Transfer Details (indicate cue type and reason): Min A sit<>stand from recliner, min -mod A for ambulation with A to recover x2 from LOB going recliner to chair 12 feet away (x2), Mod A to stand from chair without arms Toileting- Clothing Manipulation and Hygiene: Total assistance Toileting - Clothing Manipulation Details (indicate cue type and reason): min standing balance with hands on RW, Mod A for balance with one hand off of RW       General ADL Comments: educated on purse  lipped breathing throughout session     Vision Patient Visual Report: No change from baseline            Cognition Arousal/Alertness: Awake/alert Behavior During Therapy: Impulsive Overall Cognitive Status: Impaired/Different from baseline Area of Impairment: Attention;Memory;Following commands;Safety/judgement;Problem solving                   Current Attention Level: Sustained Memory: Decreased short-term memory Following Commands: Follows one step commands consistently Safety/Judgement: Decreased awareness of safety;Decreased awareness of deficits   Problem Solving: Requires verbal  cues;Requires tactile cues;Difficulty sequencing General Comments: Pt asking if he is staying in his current room, anwsered him yes until he is doing better then they will move him to another room. He then answered if that was his bed in the room and I said yes with his response being "I thought it looked like mine"                   Pertinent Vitals/ Pain       Pain Assessment: 0-10 Pain Score: 3  Pain Location: chest at rest, increases with coughing and moving but did not rate only said it was worse Pain Descriptors / Indicators: Grimacing;Moaning Pain Intervention(s): Limited activity within patient's tolerance;Monitored during session;Premedicated before session;Repositioned (using heart pillow when coughs)         Frequency  Min 2X/week        Progress Toward Goals  OT Goals(current goals can now be found in the care plan section)  Progress towards OT goals: Progressing toward goals  Acute Rehab OT Goals Patient Stated Goal: to get up and move OT Goal Formulation: With patient Time For Goal Achievement: 10/24/20 Potential to Achieve Goals: Good  Plan Discharge plan needs to be updated       AM-PAC OT "6 Clicks" Daily Activity     Outcome Measure   Help from another person eating meals?: A Little Help from another person taking care of personal grooming?: A Little Help from another person toileting, which includes using toliet, bedpan, or urinal?: A Lot Help from another person bathing (including washing, rinsing, drying)?: A Lot Help from another person to put on and taking off regular upper body clothing?: A Lot Help from another person to put on and taking off regular lower body clothing?: A Lot 6 Click Score: 14    End of Session Equipment Utilized During Treatment: Gait belt;Rolling walker  OT Visit Diagnosis: Unsteadiness on feet (R26.81);Pain;Other symptoms and signs involving cognitive function;Other abnormalities of gait and mobility (R26.89) Pain -  Right/Left:  (chest)   Activity Tolerance  (Limited by DOE--sats were good)   Patient Left in chair;with call bell/phone within reach;with chair alarm set;with family/visitor present   Nurse Communication Mobility status (chair alarm activated)        Time: 6301-6010 OT Time Calculation (min): 27 min  Charges: OT General Charges $OT Visit: 1 Visit OT Treatments $Self Care/Home Management : 23-37 mins  [place], OTR/L Acute [employer] Pager [phone] Office [phone]      Almon Register 10/12/2020, 5:11 PM

## 2020-10-12 NOTE — Progress Notes (Addendum)
.   Progress Note  Patient Name: Timothy Moran Date of Encounter: 10/12/2020  [facility] Cardiologist: Jenne Campus, MD   Subjective   Speech evaluated yesterday.  Requested continued n.p.o. status.  They will continue to follow. He swallowed water this morning and developed midsternal sharp 4/10 pain. Prior to that he had no chest pain.  Inpatient Medications    Scheduled Meds: . aspirin  81 mg Per Tube Daily  . atorvastatin  80 mg Per Tube Daily  . chlorhexidine  15 mL Mouth Rinse BID  . Chlorhexidine Gluconate Cloth  6 each Topical Daily  . enoxaparin (LOVENOX) injection  30 mg Subcutaneous Q24H  . feeding supplement (PROSource TF)  45 mL Per Tube TID  . free water  200 mL Per Tube Q6H  . guaiFENesin  10 mL Per Tube Q4H  . insulin aspart  0-9 Units Subcutaneous Q4H  . isosorbide dinitrate  30 mg Per NG tube BID  . mouth rinse  15 mL Mouth Rinse q12n4p  . senna-docusate  1 tablet Per Tube BID  . sodium chloride flush  3 mL Intravenous Q12H  . Thrombi-Pad  1 each Topical Once  . ticagrelor  90 mg Per NG tube BID   Continuous Infusions: . sodium chloride 10 mL/hr at 10/12/20 0700  . amiodarone 30 mg/hr (10/12/20 0700)  . ceFEPime (MAXIPIME) IV Stopped (10/11/20 0915)  . dexmedetomidine (PRECEDEX) IV infusion 0.3 mcg/kg/hr (10/12/20 0700)  . feeding supplement (OSMOLITE 1.5 CAL) 1,000 mL (10/11/20 1548)   PRN Meds: sodium chloride, acetaminophen (TYLENOL) oral liquid 160 mg/5 mL, fentaNYL (SUBLIMAZE) injection, LORazepam, melatonin, oxyCODONE, sodium chloride flush, white petrolatum   Vital Signs    Vitals:   10/12/20 0510 10/12/20 0600 10/12/20 0605 10/12/20 0700  BP: 132/74 117/72  128/78  Pulse: 69 65 63 75  Resp: (!) 28 (!) 21 19 (!) 26  Temp:    97.8 F (36.6 C)  TempSrc:      SpO2: 95% 97% 97% 98%  Weight:      Height:        Intake/Output Summary (Last 24 hours) at 10/12/2020 0825 Last data filed at 10/12/2020 0700 Gross per 24 hour  Intake  1712.32 ml  Output 1425 ml  Net 287.32 ml   Last 3 Weights 10/12/2020 10/11/2020 10/10/2020  Weight (lbs) 233 lb 11 oz 231 lb 0.7 oz 232 lb 5.8 oz  Weight (kg) 106 kg 104.8 kg 105.4 kg      Telemetry    Left bundle branch block, no VT, frequent PACs noted- Personally Reviewed  ECG    No new tracing today - Personally Reviewed  Physical Exam   GEN:  In bed, in mild distress sec to chest discomfort. Neck: No JVD, nasal cannula noted Cardiac: RRR, no murmurs, rubs, or gallops. Occasional ectopy Respiratory: Clear to auscultation bilaterally. GI: Soft, nontender, non-distended  MS: No edema; No deformity. Neuro:  Nonfocal  Psych: Normal affect   Labs    High Sensitivity Troponin:   Recent Labs  Lab 10/04/20 1808 10/04/20 2324 10/05/20 0500  TROPONINIHS 75* >27,000* >27,000*      Chemistry Recent Labs  Lab 10/08/20 0500 10/08/20 1635 10/09/20 0402 10/10/20 0311 10/11/20 0413  NA 140   < > 144 147* 153*  K 3.9   < > 3.6 3.5 3.6  CL 108   < > 114* 117* 121*  CO2 18*   < > 19* 19* 20*  GLUCOSE 234*   < > 164*  176* 195*  BUN 48*   < > 51* 60* 63*  CREATININE 2.97*   < > 2.70* 2.66* 2.66*  CALCIUM 8.5*   < > 7.9* 8.1* 8.0*  PROT 6.2*  --  5.3* 5.7*  --   ALBUMIN 3.1*  --  2.4* 2.4*  --   AST 107*  --  50* 41  --   ALT 83*  --  53* 51*  --   ALKPHOS 45  --  39 47  --   BILITOT 1.3*  --  1.4* 1.5*  --   GFRNONAA 21*   < > 24* 24* 24*  ANIONGAP 14   < > 11 11 12    < > = values in this interval not displayed.     Hematology Recent Labs  Lab 10/08/20 1635 10/09/20 0402 10/10/20 0436  WBC 13.9* 10.4 10.0  RBC 3.36* 3.17* 3.28*  HGB 10.3* 9.5* 9.8*  HCT 30.4* 29.1* 30.5*  MCV 90.5 91.8 93.0  MCH 30.7 30.0 29.9  MCHC 33.9 32.6 32.1  RDW 14.0 14.2 14.3  PLT 196 172 184    BNP Recent Labs  Lab 10/08/20 1456  BNP 1,434.0*     DDimer No results for input(s): DDIMER in the last 168 hours.   Radiology    No results found.  Cardiac Studies     Cath 10/04/20 Conclusions: 1. Significant two-vessel coronary artery disease including thrombotic occlusion of the distal LCx, which is the culprit for the patient's inferior STEMI as well as sequential 50% and 80% proximal/mid LAD lesions. 2. Mildly elevated left ventricular filling pressure (LVEDP 20-25 mmHg). 3. Successful PCI to distal LCx using Resolute Onyx 2.5 x 30 mm drug-eluting stent complicated by no reflow treated with intracoronary adenosine and nitroglycerin, as well as aggressive antiplatelet therapy. Final angiogram shows 0% residual stenosis with TIMI-2 flow.  Recommendations: 1. Admit to 2H-ICU for post STEMI/cardiac arrest monitoring. Consider targeted temperature management; critical care medicine has been consulted to assist with targeted temperature and ventilator management. 2. Continue tirofiban infusion for 6 hours. 3. Loaded with ticagrelor when patient arrives in ICU; patient should complete 12 months of dual antiplatelet therapy with aspirin and ticagrelor. 4. Obtain echocardiogram. 5. Aggressive secondary prevention of coronary artery disease including high intensity statin therapy. 6. Continue IV amiodarone overnight. 7. Consider staged PCI to LAD.  Echocardiogram 10/05/20 1. Since the study on 06/19/2020 LVEF has decreased, now severely impaired  at 20-25% and diffuse hypokinesis.  2. Left ventricular ejection fraction, by estimation, is 20 to 25%. The  left ventricle has severely decreased function. The left ventricle  demonstrates global hypokinesis. There is mild concentric left ventricular  hypertrophy. Left ventricular diastolic  parameters are consistent with Grade I diastolic dysfunction (impaired  relaxation).  3. Right ventricular systolic function is normal. The right ventricular  size is normal.  4. The mitral valve is normal in structure. Mild mitral valve  regurgitation. No evidence of mitral stenosis.  5. The aortic valve is normal in  structure. Aortic valve regurgitation is  trivial. No aortic stenosis is present.  6. The inferior vena cava is normal in size with greater than 50%  respiratory variability, suggesting right atrial pressure of 3 mmHg.    Patient Profile     75 y.o. male  ventricular fib Timothy Moran cardiac arrest with inferior STEMI on 10/04/2020 with ischemic cardiomyopathy EF 20 to 25% with successful stent placement to circumflex, considering staged PCI to LAD at a later date.  Complicated  by increasing respiratory needs agitation delirium pneumonia acute kidney injury.   Assessment & Plan    Inferolateral STEMI/cardiac arrest/VF as initial rhythm -PCI to left circumflex.  Improved with nitro adenosine Aggrastat after initial no reflow.  EF now 20 to 25%.  He is on aspirin and ticagrelor, per NG tube -Consider staged PCI to LAD when can be done safely.  Kidney function agitation needs to improve. No labs today yet, we will follow -Troponin greater than 27,000 -On atorvastatin.  LFTs are downtrending, elevated originally secondary to shock. -LDL 42, hemoglobin A1c 7.0 consistent with diabetes. -Amiodarone IV given VF arrest, post revascularization, we will discontinue. No further episodes of VF or VT noted. -Does not seem to be volume overloaded.  Stable.  Left bundle branch block bradycardia -Heart rate seems to be improving. -will add low-dose Toprol XL 25 mg once able to swallow  Chest discomfort -Describes a red chest pain central spreading across chest that resolves fairly quickly.   Could be musculoskeletal secondary to resuscitative efforts.  Could also be GERD related with CorPak in place.    Added on 1/29 isosorbide dinitrate 30 mg twice daily via NG tube. I will add morphine PRN.  Acute kidney injury on top of chronic kidney disease stage IIIb with baseline creatinine around 1.7, continues to be about 2.6 -Slowly resolving, creatinine gently decreasing.  Holding on angiotensin receptor  blocker.  Blood pressure would allow Entresto when able.  Diabetes with coronary artery disease -Hemoglobin A1c 7.0.  Diabetic coordinator -Consider SGLT2 inhibitor once stable.  Acute blood loss anemia -Recently had some oozing around the central line site that had resolved.  Hemoglobin drifted.  No evidence of bleeding.  Monitoring closely.  Prior history of PEs -Plan was for 3 months of anticoagulation with Eliquis.  Per patient wife he discontinue the Eliquis at home by himself.  Dr. Wendy Poet note mentions PE on 08/03/2020.  Acute chronic systolic heart failure -Was on valsartan as outpatient.  Add goal-directed medical therapy as he allows.  Renal function bradycardia blood pressure all inhibiting.  C-collar has been removed safely. Hypothermia protocol completed.  Critical care medicine on board. Discussed case. Continuing to work on speech therapy efforts. Swelling.  For questions or updates, please contact [place] Please consult [email or web address] for contact info under        Signed, Ena Dawley, MD  10/12/2020, 8:25 AM

## 2020-10-12 NOTE — Progress Notes (Signed)
NAME:  Timothy Moran, MRN:  [id number], DOB:  1945/09/29, LOS: 8 ADMISSION DATE:  10/04/2020, CONSULTATION DATE: October 05, 2019 REFERRING MD: Cardiology, CHIEF COMPLAINT: Cardiac arrest  Brief History:  75 year old male status post V. Fib cardiac arrest witnessed by wife, shocked x 4 and epi x 2.  Patient was noted to have acute inferior/inferolateral STEMI, underwent cardiac cath with stent and circumflex; came up unresponsive with nonpurposeful movements; initiated hypothermia protocol. Extubated 1/25 and was improving from a respiratory standpoint, transferred out of intensive care 1/26, transferred back to ICU 1/27 for worsening hypoxia. CXR demonstrated likely PNA.  Past Medical History:  History of meningioma, spinal tumor, benign lung nodules PET positive, chronic kidney disease stage IV, hypertension, hyperlipidemia, ascending aortic aneurysm and left bundle branch block  Significant Hospital Events:  1/23 Cardiac cath with stent 1/24 Unresponsive post-cath, hypothermia protocol initiated 1/25 Extubated to nasal cannula 1/26 Transferred out of ICU, overnight worsening hypoxia wit hCXR c/f PNA, started on BiPAP/abx 1/27 Continued Bipap requirement, AMS though received Ativan/Haldol, transfer back to ICU 1/31 Weaned to RA, minimal Precedex requirement  Consults:  Cardiology  Procedures:  Cardiac cath with a stent in left circumflex 1/23 ETT 1/23 >> 1/25 R subclavian CVC 1/23 >>  Micro:  1/27 BCx2 >>  1/27 UCx >> negative 1/27 Sputum culture >> not provided  Antibiotics:  Vancomycin 1/27 >> 1/28 Cefepime 1/27 >>  Interval History/Subjective:  No significant events overnight, requested sleep aid Afebrile with MAPs 80s O2 requirements continue to decrease [place] weaned to RA, sats > 90% Oriented x 4, improved mentation Minimal Precedex (0.3), trial off today If remaining off Precedex, could transfer out of ICU today  Objective   Blood pressure 128/78, pulse 75,  temperature 97.6 F (36.4 C), temperature source Axillary, resp. rate (!) 26, height 6\' 1"  (1.854 m), weight 106 kg, SpO2 98 %.        Intake/Output Summary (Last 24 hours) at 10/12/2020 0740 Last data filed at 10/12/2020 0700 Gross per 24 hour  Intake 1787.58 ml  Output 1575 ml  Net 212.58 ml   Filed Weights   10/10/20 0330 10/11/20 0449 10/12/20 0415  Weight: 105.4 kg 104.8 kg 106 kg   General: Elderly adult male, awake/alert/conversant, lying in bed in NAD. HEENT: Anicteric sclera, moist mucous membranes, EOMI, PERRL. Cortrak bridled in place. Neuro: A&Ox4, speaking in full sentences, answering questions appropriately, moving all 4 extremities spontaneously. No focal deficits noted. CV: S1S2, RRR, no m/g/r. PULM: Breathing even and minimally labored on RA. Good air movement in bilateral lung fields, slightly diminished at bases. Occasional wheeze, no rhonchi/rales. GI: Soft, nontender, nondistended. Slightly hypoactive bowel sounds. Extremities: No LE edema noted. Skin: Warm/dry, no rashes noted.  Assessment & Plan:   Acute hypoxic/hypercapnic respiratory failure - s/p intubation and extubation.  Now with multifocal infiltrates, likely developing VAP - Continued improvement in respiratory status - Continue Cefepime x 7 day course (end 2/3) - Albuterol neb Q6H PRN for wheezing - Diuresis per Cardiology - Precedex off today - If remaining off Precedex, PCCM to sign off  Acute inferior/inferior lateral ST elevation MI s/p DES. Status post V. fib cardiac arrest - s/p hypothermia protocol to 36 degrees. History of LBB, Cardiomyopathy (EF now 20-25% on echo 1/24), HTN.  - Echo 1/24 with EF 20-25% - Cardiology managing, appreciate assistance - Discussion of staged PCI to LAD - Continue ASA, Ticagrelor, Amio  Acute kidney injury on CKD stage IIIa  History of CKDIIIa, baseline Cr ~1.7 -  Trend BMP - UOP adequate, 1.6L/24H - Euvolemic on exam  Hypernatremia - Trend Na -  Continue FWF Q6H - Consider increasing FWF frequency vs. addition of D5 as appropriate  Acute blood loss anemia from oozing CVL site - improved - Trend H&H - Remains stable at present  History of PE Supposedly been prescribed Apixaban x 3 months, but not listed on MAR. Per Cards note, patient was to be anticoagulated for 3 months but self-discontinued. - Continue Lovenox at present - Continue ticagrelor/ASA  Type 2 diabetes  - Monitor blood glucoses on TFs - Continue SSI  Critical Care Time: 30 minutes  Critical care required in the setting of dexmedetomidine gtt titration.   Lestine Mount, PA-C [facility] 10/12/20 7:44 AM  Please see [email or web address] for pager details.

## 2020-10-13 DIAGNOSIS — D62 Acute posthemorrhagic anemia: Secondary | ICD-10-CM | POA: Diagnosis not present

## 2020-10-13 DIAGNOSIS — I469 Cardiac arrest, cause unspecified: Secondary | ICD-10-CM | POA: Diagnosis not present

## 2020-10-13 DIAGNOSIS — J9601 Acute respiratory failure with hypoxia: Secondary | ICD-10-CM | POA: Diagnosis not present

## 2020-10-13 DIAGNOSIS — N1831 Chronic kidney disease, stage 3a: Secondary | ICD-10-CM | POA: Diagnosis not present

## 2020-10-13 LAB — CULTURE, BLOOD (ROUTINE X 2)
Culture: NO GROWTH
Culture: NO GROWTH

## 2020-10-13 LAB — CBC
HCT: 30.4 % — ABNORMAL LOW (ref 39.0–52.0)
Hemoglobin: 9.8 g/dL — ABNORMAL LOW (ref 13.0–17.0)
MCH: 30.4 pg (ref 26.0–34.0)
MCHC: 32.2 g/dL (ref 30.0–36.0)
MCV: 94.4 fL (ref 80.0–100.0)
Platelets: 279 10*3/uL (ref 150–400)
RBC: 3.22 MIL/uL — ABNORMAL LOW (ref 4.22–5.81)
RDW: 15 % (ref 11.5–15.5)
WBC: 15.5 10*3/uL — ABNORMAL HIGH (ref 4.0–10.5)
nRBC: 0 % (ref 0.0–0.2)

## 2020-10-13 LAB — BASIC METABOLIC PANEL
Anion gap: 13 (ref 5–15)
BUN: 52 mg/dL — ABNORMAL HIGH (ref 8–23)
CO2: 18 mmol/L — ABNORMAL LOW (ref 22–32)
Calcium: 8.7 mg/dL — ABNORMAL LOW (ref 8.9–10.3)
Chloride: 122 mmol/L — ABNORMAL HIGH (ref 98–111)
Creatinine, Ser: 2.4 mg/dL — ABNORMAL HIGH (ref 0.61–1.24)
GFR, Estimated: 27 mL/min — ABNORMAL LOW (ref >60)
Glucose, Bld: 213 mg/dL — ABNORMAL HIGH (ref 70–99)
Potassium: 4.6 mmol/L (ref 3.5–5.1)
Sodium: 153 mmol/L — ABNORMAL HIGH (ref 135–145)

## 2020-10-13 LAB — GLUCOSE, CAPILLARY
Glucose-Capillary: 114 mg/dL — ABNORMAL HIGH (ref 70–99)
Glucose-Capillary: 192 mg/dL — ABNORMAL HIGH (ref 70–99)
Glucose-Capillary: 166 mg/dL — ABNORMAL HIGH (ref 70–99)
Glucose-Capillary: 185 mg/dL — ABNORMAL HIGH (ref 70–99)
Glucose-Capillary: 185 mg/dL — ABNORMAL HIGH (ref 70–99)

## 2020-10-13 MED ORDER — SODIUM CHLORIDE 0.9 % IV SOLN
2.0000 g | Freq: Two times a day (BID) | INTRAVENOUS | Status: AC
  Administered 2020-10-13 – 2020-10-14 (×3): 2 g via INTRAVENOUS
  Filled 2020-10-13 (×3): qty 2

## 2020-10-13 MED ORDER — FREE WATER
200.0000 mL | Status: DC
  Administered 2020-10-13 – 2020-10-16 (×18): 200 mL

## 2020-10-13 MED ORDER — METOPROLOL SUCCINATE ER 25 MG PO TB24
25.0000 mg | ORAL_TABLET | Freq: Every day | ORAL | Status: DC
  Administered 2020-10-13 – 2020-10-22 (×10): 25 mg via ORAL
  Filled 2020-10-13 (×10): qty 1

## 2020-10-13 NOTE — Progress Notes (Signed)
[place] Progress Note Patient Name: Timothy Moran DOB: April 09, 1946 MRN: [id number]   Date of Service  10/13/2020  HPI/Events of Note  Camera: Anxious, agitated. On nasal o2. VS stable. Alert, but not oriented. Got ativan.  Precedex gtt re started.  PCCM signed off already earlier. Notes, labs, meds reviewed   eICU Interventions  Asp /fall precautions. As above.      Intervention Category Intermediate Interventions: Other:  Elmer Sow 10/13/2020, 8:40 PM

## 2020-10-13 NOTE — Progress Notes (Signed)
NAME:  Timothy Moran, MRN:  [id number], DOB:  May 09, 1946, LOS: 9 ADMISSION DATE:  10/04/2020, CONSULTATION DATE: October 05, 2019 REFERRING MD: Cardiology, CHIEF COMPLAINT: Cardiac arrest  Brief History:  75 year old male status post V. Fib cardiac arrest witnessed by wife, shocked x 4 and epi x 2.  Patient was noted to have acute inferior/inferolateral STEMI, underwent cardiac cath with stent and circumflex; came up unresponsive with nonpurposeful movements; initiated hypothermia protocol. Extubated 1/25 and was improving from a respiratory standpoint, transferred out of intensive care 1/26, transferred back to ICU 1/27 for worsening hypoxia. CXR demonstrated likely PNA.  Past Medical History:  History of meningioma, spinal tumor, benign lung nodules PET positive, chronic kidney disease stage IV, hypertension, hyperlipidemia, ascending aortic aneurysm and left bundle branch block  Significant Hospital Events:  1/23 Cardiac cath with stent 1/24 Unresponsive post-cath, hypothermia protocol initiated 1/25 Extubated to nasal cannula 1/26 Transferred out of ICU, overnight worsening hypoxia wit hCXR c/f PNA, started on BiPAP/abx 1/27 Continued Bipap requirement, AMS though received Ativan/Haldol, transfer back to ICU 1/31 Weaned to RA, minimal Precedex requirement 2/01 Off Precedex, on RA, no further ICU needs, PCCM sign off  Consults:  Cardiology  Procedures:  Cardiac cath with a stent in left circumflex 1/23 ETT 1/23 >> 1/25 R subclavian CVC 1/23 >>  Micro:  1/27 BCx2 >>  1/27 UCx >> negative 1/27 Sputum culture >> not provided  Antibiotics:  Vancomycin 1/27 >> 1/28 Cefepime 1/27 >>  Interval History/Subjective:  No significant events overnight Reports dry mouth this morning, otherwise feeling ok Denies SOB, CP - remains on RA Motivated to get out of the ICU and "get stronger again" He and wife are amenable to CIR  Objective   Blood pressure (!) 155/94, pulse 79,  temperature 99.4 F (37.4 C), temperature source Axillary, resp. rate 20, height 6\' 1"  (1.854 m), weight 104.6 kg, SpO2 91 %.    FiO2 (%):  [21 %] 21 %   Intake/Output Summary (Last 24 hours) at 10/13/2020 0945 Last data filed at 10/13/2020 0900 Gross per 24 hour  Intake 1006.72 ml  Output 2075 ml  Net -1068.28 ml   Filed Weights   10/11/20 0449 10/12/20 0415 10/13/20 0400  Weight: 104.8 kg 106 kg 104.6 kg   General: Elderly adult male, awake/conversant, in NAD. HEENT: Anicteric sclera, dry mucous membranes, PERRL. Cortrak bridled in place. Neuro: A&Ox4, speaking in full sentences, moving all 4 extremities, no focal deficits noted. CV: S1S2, RRR, no m/g/r. PULM: Breathing even and unlabored on RA. Good air movement. Lung fields grossly CTAB, occasional expiratory wheeze noted. GI: Soft, nontender, nondistended. Normoactive BS. Extremities: No BLE edema noted. Skin: Warm/dry without rashes.  Assessment & Plan:   Acute hypoxic/hypercapnic respiratory failure S/p intubation, extubated 1/25. Now with multifocal infiltrates, c/f VAP - No further O2 needs - Continue cefepime through 2/3 - Diuresis per Cardiology - PCCM to sign off today  Acute inferior/inferior lateral ST elevation MI s/p DES. Status post V. fib cardiac arrest - s/p hypothermia protocol to 36 degrees. History of LBB, Cardiomyopathy (EF now 20-25% on echo 1/24), HTN.  - Cardiology managing - Ongoing discussion of staged PCI to LAD once clinically more stable - Amio discontinued per Cards - Continue ASA, ticagrelor  Acute kidney injury on CKD stage IIIa  History of CKDIIIa, baseline Cr ~1.7 - Trend BMP - Monitor I&Os  Hypernatremia - Trend Na - Increase FWF Q4H  Acute blood loss anemia from oozing CVL site - improved -  Trend H&H - Remains stable at present  History of PE Supposedly been prescribed Apixaban x 3 months, but not listed on MAR. Per Cards note, patient was to be anticoagulated for 3 months but  self-discontinued. - Continue Lovenox at present - Continue ticagrelor/ASA  Type 2 diabetes  - Monitor blood glucoses on TFs - Continue SSI  Physical deconditioning Remains physically deconditioned s/p STEMI/cardiac arrest, exacerbated by immobility post-arrest. - PT/OT following - Recommendation for CIR, pending medical readiness  PCCM to sign off, given patient's continued clinical improvement, resolved respiratory failure and no further ICU needs. Please reconsult as needed.  Critical Care Time: N/A   Rhae Lerner [facility] 10/13/20 9:45 AM  Please see [email or web address] for pager details.

## 2020-10-13 NOTE — Progress Notes (Signed)
  Speech Language Pathology Treatment: Dysphagia  Patient Details Name: Timothy Moran MRN: [id number] DOB: 06/08/1946 Today's Date: 10/13/2020 Time: 7867-5449 SLP Time Calculation (min) (ACUTE ONLY): 15 min  Assessment / Plan / Recommendation Clinical Impression  Pt consumed ice chips, small sips of water, and purees with throat clears and coughs  throughout all PO trials. Multiple swallows were also noted with purees during initial intake but resolved with other trials. Pt's breathing appeared to become more rapid and wheezing was noted during PO trials. He benefited from [place] cues for safety such as taking smaller sips. Provided education on aspiration precautions with pt's wife and suggested MBS given pt's continued potential s/s of aspiration during PO trials. Recommend pt remain NPO until further testing with MBS can be completed - pt's wife agreed to testing but also stated pt is very fatigued today. MBS will be completed pending pt's endurance. Discussed providing minimal ice chips after oral care with RN to continue to increase pt's readiness for POs.     HPI HPI: Pt is a 75 y.o. male who presented to the ED on 1/23 after STEMI/cardiac arrest. Pt was intubated from 10/04/20 to 10/06/20. CT Head from 10/04/20 showed no acute intracranial abnormality and recent CXRs show no signs of infection. CXR from 1/27 revealed multifocal airspace opacity. PMH of LBBB, multiple symptomatic meningiomas, CKD stage III, 2 prior pulmonary embolisms, HTN, pulmonary nodules, and bradycardia.      SLP Plan  Continue with current plan of care;MBS       Recommendations  Diet recommendations: NPO Medication Administration: Via alternative means                Oral Care Recommendations: Oral care QID Follow up Recommendations: Inpatient Rehab;24 hour supervision/assistance SLP Visit Diagnosis: Dysphagia, unspecified (R13.10) Plan: Continue with current plan of care;MBS       GO                Jeanine Luz., SLP Student 10/13/2020, 11:20 AM

## 2020-10-13 NOTE — Progress Notes (Signed)
Pt had walked with physical therapy around 1610 and was in chair with chair alarm on, feet were elevated. Pt had been confused throughout the shift. NT's found pt with his bottom on the ground with the chair alarm alarming. Pt stated he was trying to get back into bed. RN and NT's were able to assist pt to stand and lay down in bed. Pt claims that his bottom initially hurt when the incident occurred but that it now feels "okay." RN called pt's wife and left a voicemail. Pt's attending MD was paged at [phone], Rosaria Ferries PA returned call and no new orders. Will continue to monitor pt's bottom for bruising. Educated pt about alarms and safety precautions.

## 2020-10-13 NOTE — Progress Notes (Signed)
Pharmacy Antibiotic Note  Dejuan Elman is a 75 y.o. male with possible pneumonia.  Pharmacy has been consulted for cefepime dosing. Received 1 dose of vancomycin on 1/27. SCr has continued to improve, CrCl currently ~33 so will adjust cefepime for last two days.  Plan: -Adjust cefepime to 2g IV q12 hrs for 7 days through 2/2 -Monitor renal function, clinical progression, cultures/sensitivities   Height: 6\' 1"  (185.4 cm) Weight: 104.6 kg (230 lb 9.6 oz) IBW/kg (Calculated) : 79.9  Temp (24hrs), Avg:98.8 F (37.1 C), Min:98.4 F (36.9 C), Max:99.4 F (37.4 C)  Recent Labs  Lab 10/07/20 2337 10/08/20 0500 10/08/20 0520 10/08/20 1635 10/09/20 0402 10/10/20 0311 10/10/20 0436 10/11/20 0413 10/12/20 1151 10/12/20 1356 10/13/20 0553  WBC  --    < >  --  13.9* 10.4  --  10.0  --   --  16.1* 15.5*  CREATININE  --    < >  --  2.71* 2.70* 2.66*  --  2.66* 2.39*  --  2.40*  LATICACIDVEN 2.9*  --  2.5*  --   --   --   --   --   --   --   --    < > = values in this interval not displayed.    Estimated Creatinine Clearance: 33.3 mL/min (A) (by C-G formula based on SCr of 2.4 mg/dL (H)).    No Known Allergies  Antimicrobials this admission: 1/27 vanc x1 1/27 cefepime>>(2/2)  Dose adjustments this admission: None  Microbiology results: 1/27 BCxL ngtd 1/27 UCx: negF 1/27 MRSA PCR neg  Arrie Senate, PharmD, BCPS, [facility], [facility] Clinical Pharmacist [phone] Please check AMION for all [place] numbers 10/13/2020

## 2020-10-13 NOTE — Progress Notes (Addendum)
.   Progress Note  Patient Name: Timothy Moran Date of Encounter: 10/13/2020  [facility] Cardiologist: Jenne Campus, MD   Subjective   The patient continues to have chest pain with deep inspiration and position changes.  Inpatient Medications    Scheduled Meds: . aspirin  81 mg Per Tube Daily  . atorvastatin  80 mg Per Tube Daily  . bisacodyl  10 mg Rectal Q0600  . chlorhexidine  15 mL Mouth Rinse BID  . Chlorhexidine Gluconate Cloth  6 each Topical Daily  . enoxaparin (LOVENOX) injection  30 mg Subcutaneous Q24H  . feeding supplement (PROSource TF)  45 mL Per Tube TID  . free water  200 mL Per Tube Q4H  . guaiFENesin  10 mL Per Tube Q4H  . insulin aspart  0-9 Units Subcutaneous Q4H  . isosorbide dinitrate  30 mg Per NG tube BID  . mouth rinse  15 mL Mouth Rinse q12n4p  . senna-docusate  1 tablet Per Tube BID  . sodium chloride flush  3 mL Intravenous Q12H  . sorbitol  30 mL Per Tube Q0600  . Thrombi-Pad  1 each Topical Once  . ticagrelor  90 mg Per NG tube BID   Continuous Infusions: . sodium chloride Stopped (10/12/20 1441)  . ceFEPime (MAXIPIME) IV    . dexmedetomidine (PRECEDEX) IV infusion Stopped (10/12/20 1100)  . feeding supplement (OSMOLITE 1.5 CAL) 1,000 mL (10/13/20 0951)   PRN Meds: sodium chloride, acetaminophen (TYLENOL) oral liquid 160 mg/5 mL, albuterol, LORazepam, melatonin, morphine injection, oxyCODONE, sodium chloride flush, white petrolatum   Vital Signs    Vitals:   10/13/20 0600 10/13/20 0700 10/13/20 0800 10/13/20 0900  BP: (!) 165/89  (!) 157/92 (!) 155/94  Pulse: 81  79 79  Resp: 19  (!) 29 20  Temp:  99.4 F (37.4 C)    TempSrc:  Axillary    SpO2: 95%  94% 91%  Weight:      Height:        Intake/Output Summary (Last 24 hours) at 10/13/2020 1120 Last data filed at 10/13/2020 0900 Gross per 24 hour  Intake 1006.72 ml  Output 2075 ml  Net -1068.28 ml   Last 3 Weights 10/13/2020 10/12/2020 10/11/2020  Weight (lbs) 230 lb 9.6  oz 233 lb 11 oz 231 lb 0.7 oz  Weight (kg) 104.6 kg 106 kg 104.8 kg      Telemetry    Left bundle branch block, no VT, frequent PACs noted- Personally Reviewed  ECG    No new tracing today - Personally Reviewed  Physical Exam   GEN:  In bed, in mild distress sec to chest discomfort. Neck: No JVD, nasal cannula noted Cardiac: RRR, no murmurs, rubs, or gallops. Occasional ectopy Respiratory: Clear to auscultation bilaterally. GI: Soft, nontender, non-distended  MS: No edema; No deformity. Neuro:  Nonfocal  Psych: Normal affect   Labs    High Sensitivity Troponin:   Recent Labs  Lab 10/04/20 1808 10/04/20 2324 10/05/20 0500  TROPONINIHS 75* >27,000* >27,000*      Chemistry Recent Labs  Lab 10/08/20 0500 10/08/20 1635 10/09/20 0402 10/10/20 0311 10/11/20 0413 10/12/20 1151 10/13/20 0553  NA 140   < > 144 147* 153* 153* 153*  K 3.9   < > 3.6 3.5 3.6 4.1 4.6  CL 108   < > 114* 117* 121* 123* 122*  CO2 18*   < > 19* 19* 20* 19* 18*  GLUCOSE 234*   < > 164* 176*  195* 122* 213*  BUN 48*   < > 51* 60* 63* 59* 52*  CREATININE 2.97*   < > 2.70* 2.66* 2.66* 2.39* 2.40*  CALCIUM 8.5*   < > 7.9* 8.1* 8.0* 8.2* 8.7*  PROT 6.2*  --  5.3* 5.7*  --   --   --   ALBUMIN 3.1*  --  2.4* 2.4*  --   --   --   AST 107*  --  50* 41  --   --   --   ALT 83*  --  53* 51*  --   --   --   ALKPHOS 45  --  39 47  --   --   --   BILITOT 1.3*  --  1.4* 1.5*  --   --   --   GFRNONAA 21*   < > 24* 24* 24* 27* 27*  ANIONGAP 14   < > 11 11 12 11 13    < > = values in this interval not displayed.     Hematology Recent Labs  Lab 10/10/20 0436 10/12/20 1356 10/13/20 0553  WBC 10.0 16.1* 15.5*  RBC 3.28* 3.24* 3.22*  HGB 9.8* 9.8* 9.8*  HCT 30.5* 31.6* 30.4*  MCV 93.0 97.5 94.4  MCH 29.9 30.2 30.4  MCHC 32.1 31.0 32.2  RDW 14.3 14.8 15.0  PLT 184 243 279    BNP Recent Labs  Lab 10/08/20 1456  BNP 1,434.0*     DDimer No results for input(s): DDIMER in the last 168 hours.    Radiology    No results found.  Cardiac Studies    Cath 10/04/20 Conclusions: 1. Significant two-vessel coronary artery disease including thrombotic occlusion of the distal LCx, which is the culprit for the patient's inferior STEMI as well as sequential 50% and 80% proximal/mid LAD lesions. 2. Mildly elevated left ventricular filling pressure (LVEDP 20-25 mmHg). 3. Successful PCI to distal LCx using Resolute Onyx 2.5 x 30 mm drug-eluting stent complicated by no reflow treated with intracoronary adenosine and nitroglycerin, as well as aggressive antiplatelet therapy. Final angiogram shows 0% residual stenosis with TIMI-2 flow.  Recommendations: 1. Admit to 2H-ICU for post STEMI/cardiac arrest monitoring. Consider targeted temperature management; critical care medicine has been consulted to assist with targeted temperature and ventilator management. 2. Continue tirofiban infusion for 6 hours. 3. Loaded with ticagrelor when patient arrives in ICU; patient should complete 12 months of dual antiplatelet therapy with aspirin and ticagrelor. 4. Obtain echocardiogram. 5. Aggressive secondary prevention of coronary artery disease including high intensity statin therapy. 6. Continue IV amiodarone overnight. 7. Consider staged PCI to LAD.  Echocardiogram 10/05/20 1. Since the study on 06/19/2020 LVEF has decreased, now severely impaired  at 20-25% and diffuse hypokinesis.  2. Left ventricular ejection fraction, by estimation, is 20 to 25%. The  left ventricle has severely decreased function. The left ventricle  demonstrates global hypokinesis. There is mild concentric left ventricular  hypertrophy. Left ventricular diastolic  parameters are consistent with Grade I diastolic dysfunction (impaired  relaxation).  3. Right ventricular systolic function is normal. The right ventricular  size is normal.  4. The mitral valve is normal in structure. Mild mitral valve  regurgitation. No evidence  of mitral stenosis.  5. The aortic valve is normal in structure. Aortic valve regurgitation is  trivial. No aortic stenosis is present.  6. The inferior vena cava is normal in size with greater than 50%  respiratory variability, suggesting right atrial pressure of 3 mmHg.  Patient Profile     75 y.o. male  ventricular fib [place] cardiac arrest with inferior STEMI on 10/04/2020 with ischemic cardiomyopathy EF 20 to 25% with successful stent placement to circumflex, considering staged PCI to LAD at a later date.  Complicated by increasing respiratory needs agitation delirium pneumonia acute kidney injury.   Assessment & Plan    Inferolateral STEMI/cardiac arrest/VF as initial rhythm -PCI to left circumflex.  Improved with nitro adenosine Aggrastat after initial no reflow.  EF now 20 to 25%.  He is on aspirin and ticagrelor, per NG tube -Consider staged PCI to LAD when can be done safely.  Kidney function agitation needs to improve. No labs today yet, we will follow -Troponin greater than 27,000 -On atorvastatin.  LFTs are downtrending, elevated originally secondary to shock. -LDL 42, hemoglobin A1c 7.0 consistent with diabetes. -Amiodarone IV given VF arrest, post revascularization, we will discontinue. No further episodes of VF or VT noted. -Does not seem to be volume overloaded.  Stable.  Left bundle branch block bradycardia -Heart rate seems to be improving. -will add low-dose Toprol XL 25 mg  Chest discomfort -Describes a red chest pain central spreading across chest that resolves fairly quickly.   Could be musculoskeletal secondary to resuscitative efforts.  Could also be GERD related with CorPak in place.    Added on 1/29 isosorbide dinitrate 30 mg twice daily via NG tube. I will add Tramadol PRN.  Acute kidney injury on top of chronic kidney disease stage IIIb with baseline creatinine around 1.7, slowly improving 2.7-> 2.4 -Slowly resolving, creatinine gently decreasing.   Holding on angiotensin receptor blocker.  Blood pressure would allow Entresto when able.  Diabetes with coronary artery disease -Hemoglobin A1c 7.0.  Diabetic coordinator -Consider SGLT2 inhibitor once stable.  Acute blood loss anemia -Recently had some oozing around the central line site that had resolved.  Hemoglobin drifted.  No evidence of bleeding.  Monitoring closely.  Prior history of PEs -Plan was for 3 months of anticoagulation with Eliquis.  Per patient wife he discontinue the Eliquis at home by himself.  Dr. Wendy Poet note mentions PE on 08/03/2020.  Acute chronic systolic heart failure -Was on valsartan as outpatient.  Add goal-directed medical therapy as he allows.  Renal function bradycardia blood pressure all inhibiting.  C-collar has been removed safely. Hypothermia protocol completed.  Continuing to work on speech therapy efforts - he is still NPO  The patient can be transferred to a progressive care unit.  For questions or updates, please contact [place] Please consult [email or web address] for contact info under     Signed, Ena Dawley, MD  10/13/2020, 11:20 AM

## 2020-10-13 NOTE — Progress Notes (Signed)
Physical Therapy Treatment Patient Details Name: Timothy Moran MRN: [id number] DOB: 26-Oct-1945 Today's Date: 10/13/2020    History of Present Illness Pt is 75 year old male with a history of LBBB, mildly reduced LVEF, 4.2cm TAA, multiple symptomatic meningiomas (possible NF2 suspected), CKD stage III, two prior pulmonary embolisms (now off Eliquis), HTN, and pulmonary nodules, who presented as a resuscitated VF arrest at home and was found to have inferior ST elevation on ECG. He began having chest pain while shoveling snow this afternoon. He collapsed and lost consciousness with no obvious injuries in the fall. No bystander CPR performed. He was found with agonal breathing and an AED-shockable rhythm. He received a total of 4 defibrillations by fire and EMS.  Patient is more alert, but remains confused with decreased awareness and safety.  Pt s/p cardiac cath with stent on 1/23 and was intubated until 1/25.    PT Comments    Pt is very motivated and making good progress today.  He does have decreased safety awareness and impulsivity requiring frequent cues.  Pt ambulated 7' with RW and min-mod A for unsteadiness with frequent cues for safety.  Pt with increased DOE and RR with ambulation, sats briefly down to 89% but otherwise VSS.  Continue to progress as able.     Follow Up Recommendations  CIR     Equipment Recommendations  Rolling walker with 5" wheels;Wheelchair (measurements PT);Wheelchair cushion (measurements PT)    Recommendations for Other Services Rehab consult     Precautions / Restrictions Precautions Precautions: Fall    Mobility  Bed Mobility Overal bed mobility: Needs Assistance Bed Mobility: Supine to Sit     Supine to sit: Min assist     General bed mobility comments: Use of momentum and min A  Transfers Overall transfer level: Needs assistance Equipment used: Rolling walker (2 wheeled) Transfers: Sit to/from Stand Sit to Stand: Min assist          General transfer comment: Cues for safe hand placement and min A to rise from bed  Ambulation/Gait Ambulation/Gait assistance: Mod assist Gait Distance (Feet): 60 Feet Assistive device: Rolling walker (2 wheeled) Gait Pattern/deviations: Step-to pattern;Trunk flexed;Drifts right/left Gait velocity: reduced   General Gait Details: Pt required frequent cues for RW proximity, posture, and safety.  He mostly required min A to steady but did have 2 episodes requiring mod A to maintain balance.  Also, required cues to "stop and regain balance" frequently as pt with increased forward momentum and tends to push walker too far forward.   Stairs             Wheelchair Mobility    Modified Rankin (Stroke Patients Only)       Balance Overall balance assessment: Needs assistance Sitting-balance support: No upper extremity supported;Feet supported Sitting balance-Leahy Scale: Fair Sitting balance - Comments: Supervision at EOB   Standing balance support: Bilateral upper extremity supported Standing balance-Leahy Scale: Poor Standing balance comment: Required RW and min A                            Cognition Arousal/Alertness: Awake/alert Behavior During Therapy: Impulsive Overall Cognitive Status: Impaired/Different from baseline Area of Impairment: Attention;Memory;Following commands;Safety/judgement;Problem solving;Orientation;Awareness                 Orientation Level: Disoriented to;Time Current Attention Level: Sustained Memory: Decreased short-term memory Following Commands: Follows one step commands consistently Safety/Judgement: Decreased awareness of safety;Decreased awareness of deficits Awareness:  Emergent Problem Solving: Requires verbal cues;Requires tactile cues General Comments: Pt needed frequent cues to wait until therapist ready for transfers. Very motivated and states "wanting to take the next steps with therapy."  Decreased awareness of  safety requiring frequent cues that he is not safe to be up alone.      Exercises General Exercises - Lower Extremity Ankle Circles/Pumps: AROM;Both;10 reps;Supine Long Arc Quad: AROM;Both;10 reps;Seated Hip ABduction/ADduction: AROM;Both;10 reps;Seated (clam shell with pillow squeeze for ADD) Hip Flexion/Marching: AROM;10 reps;Both;Seated    General Comments General comments (skin integrity, edema, etc.): Pt on RA with sats 97% rest, 89% ambulation but quickly returned to upper 90's.  Pt with RR 18 rest and up to 32 with activity.  DOE of 3/4 requiring rest breaks and cues for relaxation/deep breaths.  HR and BP stable.      Pertinent Vitals/Pain Pain Assessment: 0-10 Pain Score: 2  Pain Location: Soreness in chest from CPR Pain Descriptors / Indicators: Sore Pain Intervention(s): Limited activity within patient's tolerance;Monitored during session;Repositioned (reports feels better OOB)    Home Living                      Prior Function            PT Goals (current goals can now be found in the care plan section) Acute Rehab PT Goals Patient Stated Goal: to get up and move PT Goal Formulation: With patient/family Time For Goal Achievement: 10/24/20 Potential to Achieve Goals: Good Progress towards PT goals: Progressing toward goals    Frequency    Min 3X/week      PT Plan Current plan remains appropriate    Co-evaluation              AM-PAC PT "6 Clicks" Mobility   Outcome Measure  Help needed turning from your back to your side while in a flat bed without using bedrails?: A Little Help needed moving from lying on your back to sitting on the side of a flat bed without using bedrails?: A Little Help needed moving to and from a bed to a chair (including a wheelchair)?: A Little Help needed standing up from a chair using your arms (e.g., wheelchair or bedside chair)?: A Little Help needed to walk in hospital room?: A Lot Help needed climbing 3-5  steps with a railing? : A Lot 6 Click Score: 2    End of Session Equipment Utilized During Treatment: Gait belt Activity Tolerance: Patient tolerated treatment well Patient left: with chair alarm set;in chair;with call bell/phone within reach;with family/visitor present Nurse Communication: Mobility status PT Visit Diagnosis: Unsteadiness on feet (R26.81);Other abnormalities of gait and mobility (R26.89);Muscle weakness (generalized) (M62.81)     Time: 7858-8502 PT Time Calculation (min) (ACUTE ONLY): 26 min  Charges:  $Gait Training: 8-22 mins $Therapeutic Exercise: 8-22 mins                     Abran Richard, PT Acute Rehab Services Pager [phone] [facility] [place] 10/13/2020, 4:07 PM

## 2020-10-14 DIAGNOSIS — N1831 Chronic kidney disease, stage 3a: Secondary | ICD-10-CM | POA: Diagnosis not present

## 2020-10-14 DIAGNOSIS — I469 Cardiac arrest, cause unspecified: Secondary | ICD-10-CM | POA: Diagnosis not present

## 2020-10-14 DIAGNOSIS — J9601 Acute respiratory failure with hypoxia: Secondary | ICD-10-CM | POA: Diagnosis not present

## 2020-10-14 DIAGNOSIS — D62 Acute posthemorrhagic anemia: Secondary | ICD-10-CM | POA: Diagnosis not present

## 2020-10-14 LAB — GLUCOSE, CAPILLARY
Glucose-Capillary: 124 mg/dL — ABNORMAL HIGH (ref 70–99)
Glucose-Capillary: 130 mg/dL — ABNORMAL HIGH (ref 70–99)
Glucose-Capillary: 169 mg/dL — ABNORMAL HIGH (ref 70–99)
Glucose-Capillary: 173 mg/dL — ABNORMAL HIGH (ref 70–99)
Glucose-Capillary: 202 mg/dL — ABNORMAL HIGH (ref 70–99)
Glucose-Capillary: 204 mg/dL — ABNORMAL HIGH (ref 70–99)

## 2020-10-14 LAB — BASIC METABOLIC PANEL
Anion gap: 11 (ref 5–15)
BUN: 53 mg/dL — ABNORMAL HIGH (ref 8–23)
CO2: 18 mmol/L — ABNORMAL LOW (ref 22–32)
Calcium: 8.6 mg/dL — ABNORMAL LOW (ref 8.9–10.3)
Chloride: 122 mmol/L — ABNORMAL HIGH (ref 98–111)
Creatinine, Ser: 2.25 mg/dL — ABNORMAL HIGH (ref 0.61–1.24)
GFR, Estimated: 29 mL/min — ABNORMAL LOW (ref >60)
Glucose, Bld: 210 mg/dL — ABNORMAL HIGH (ref 70–99)
Potassium: 4.4 mmol/L (ref 3.5–5.1)
Sodium: 151 mmol/L — ABNORMAL HIGH (ref 135–145)

## 2020-10-14 MED ORDER — ENOXAPARIN SODIUM 40 MG/0.4ML ~~LOC~~ SOLN
40.0000 mg | SUBCUTANEOUS | Status: DC
  Administered 2020-10-14 – 2020-10-17 (×4): 40 mg via SUBCUTANEOUS
  Filled 2020-10-14 (×4): qty 0.4

## 2020-10-14 NOTE — Progress Notes (Signed)
  Speech Language Pathology Treatment: Dysphagia  Patient Details Name: Timothy Moran MRN: [id number] DOB: Jan 21, 1946 Today's Date: 10/14/2020 Time: 0912-0929 SLP Time Calculation (min) (ACUTE ONLY): 17 min  Assessment / Plan / Recommendation Clinical Impression  Pt was observed with ice chips, thin liquids, and purees. Immediate throat clears were noted with ice chips and thin liquids and pt benefited from Min verbal cues to take small sips. Pt's wife noted that pt demonstrated throat clears prior to admission and stated that "he has always done that." Multiple swallows were noted with purees; however, pt was able to consume almost 4 oz of applesauce with no other overt s/s of aspiration. Recommend DYS 1 diet (floor stock puddings and purees) with no liquids (minimal ice chips are okay after oral care) given potential s/s of aspiration with thin liquids. Provided education on aspiration precautions and discussed completing a FEES tomorrow to further examine oropharyngeal swallowing - pt and wife agreed.    HPI HPI: Pt is a 75 y.o. male who presented to the ED on 1/23 after STEMI/cardiac arrest. Pt was intubated from 10/04/20 to 10/06/20. CT Head from 10/04/20 showed no acute intracranial abnormality and recent CXRs show no signs of infection. CXR from 1/27 revealed multifocal airspace opacity. PMH of LBBB, multiple symptomatic meningiomas, CKD stage III, 2 prior pulmonary embolisms, HTN, pulmonary nodules, and bradycardia.      SLP Plan  Continue with current plan of care       Recommendations  Diet recommendations: Dysphagia 1 (puree);Other(comment) (No liquids, floor stock puddings and purees) Supervision: Patient able to self feed;Full supervision/cueing for compensatory strategies Compensations: Slow rate;Small sips/bites Postural Changes and/or Swallow Maneuvers: Seated upright 90 degrees                Oral Care Recommendations: Oral care BID Follow up Recommendations: Inpatient  Rehab;24 hour supervision/assistance SLP Visit Diagnosis: Dysphagia, unspecified (R13.10) Plan: Continue with current plan of care       GO                Jeanine Luz., SLP Student 10/14/2020, 10:08 AM

## 2020-10-14 NOTE — Progress Notes (Signed)
Physical Therapy Treatment Patient Details Name: Timothy Moran MRN: [id number] DOB: July 01, 1946 Today's Date: 10/14/2020    History of Present Illness Pt is 75 year old male with a history of LBBB, mildly reduced LVEF, 4.2cm TAA, multiple symptomatic meningiomas (possible NF2 suspected), CKD stage III, two prior pulmonary embolisms (now off Eliquis), HTN, and pulmonary nodules, who presented as a resuscitated VF arrest at home and was found to have inferior ST elevation on ECG. He began having chest pain while shoveling snow this afternoon. He collapsed and lost consciousness with no obvious injuries in the fall. No bystander CPR performed. He was found with agonal breathing and an AED-shockable rhythm. He received a total of 4 defibrillations by fire and EMS.  Patient is more alert, but remains confused with decreased awareness and safety.  Pt s/p cardiac cath with stent on 1/23 and was intubated until 1/25.    PT Comments    Patient progressing slowly due to continued weakness, confusion and poor safety/deficit awareness.  Wife present throughout and supportive.  He had no balance reactions when trying to free his rectal tube from under his foot and had posterior LOB landing in the chair behind him.  Also lots of wobbles to side with ambulation with RW in hallway needing heavy mod A for safety.  Patient fatigued quickly as well.  Continue to feel he needs CIR level rehab prior to d/c home.  PT will continue to follow acutely.    Follow Up Recommendations  CIR     Equipment Recommendations  Rolling walker with 5" wheels;Wheelchair (measurements PT);Wheelchair cushion (measurements PT)    Recommendations for Other Services       Precautions / Restrictions Precautions Precautions: Fall Precaution Comments: core track, rectal pouch/flexiseal    Mobility  Bed Mobility Overal bed mobility: Needs Assistance Bed Mobility: Sit to Supine       Sit to supine: +2 for safety/equipment;Mod  assist   General bed mobility comments: RN assisted with shoulders and PT with legs to supine, pt confused to what he needed to do  Transfers Overall transfer level: Needs assistance Equipment used: Rolling walker (2 wheeled) Transfers: Sit to/from Stand Sit to Stand: Mod assist         General transfer comment: some lifting help to stand, then pt sat with uncontrolled descent due to posterior LOB and PT assisting with lines, etc as pt standing on flexiseal tube  Ambulation/Gait Ambulation/Gait assistance: Mod assist Gait Distance (Feet): 100 Feet Assistive device: Rolling walker (2 wheeled) Gait Pattern/deviations: Step-through pattern;Decreased stride length;Trunk flexed;Drifts right/left     General Gait Details: increased stride length wtih some epiosdes of knee buckling noted.  needed to stop and stand to regain balance and cues for PLB due to increased RR.  mod A also due to getting feet outside of walker on first turn in the room and LOB to the R x 2   Stairs             Wheelchair Mobility    Modified Rankin (Stroke Patients Only)       Balance Overall balance assessment: Needs assistance Sitting-balance support: No upper extremity supported;Feet supported Sitting balance-Leahy Scale: Fair Sitting balance - Comments: Supervision at EOB Postural control: Posterior lean   Standing balance-Leahy Scale: Poor Standing balance comment: no balance reactions noted when pt with LOB posterior and "fell" back into the chair after initial sit to stand  Cognition Arousal/Alertness: Awake/alert Behavior During Therapy: Impulsive Overall Cognitive Status: Impaired/Different from baseline Area of Impairment: Attention;Memory;Following commands;Safety/judgement;Problem solving                   Current Attention Level: Sustained Memory: Decreased short-term memory Following Commands: Follows one step commands  consistently;Follows one step commands with increased time Safety/Judgement: Decreased awareness of deficits   Problem Solving: Requires verbal cues;Requires tactile cues        Exercises      General Comments General comments (skin integrity, edema, etc.): SpO2 stable, but increased RR/SOB with ambulation cues for pursed lip breathing and stopping to rest during ambulation in hallway.  Urinated in urinal after returned to sitting, but took a long time to void and still dripping so donned brief and pad to walk.  Patient initially in recliner and had slid all the way down in chair with feet on extra chair in front; had to lower back of recliner and scoot up with RN assist to prevent falling when lowering footrest.      Pertinent Vitals/Pain Pain Assessment: Faces Faces Pain Scale: Hurts little more Pain Location: Soreness in chest from CPR; discomfort from flexiseal Pain Descriptors / Indicators: Grimacing;Guarding;Sore Pain Intervention(s): Monitored during session;Repositioned    Home Living                      Prior Function            PT Goals (current goals can now be found in the care plan section) Progress towards PT goals: Progressing toward goals    Frequency    Min 3X/week      PT Plan Current plan remains appropriate    Co-evaluation              AM-PAC PT "6 Clicks" Mobility   Outcome Measure  Help needed turning from your back to your side while in a flat bed without using bedrails?: A Little Help needed moving from lying on your back to sitting on the side of a flat bed without using bedrails?: A Little Help needed moving to and from a bed to a chair (including a wheelchair)?: A Little Help needed standing up from a chair using your arms (e.g., wheelchair or bedside chair)?: A Lot Help needed to walk in hospital room?: A Lot Help needed climbing 3-5 steps with a railing? : A Lot 6 Click Score: 15    End of Session Equipment Utilized  During Treatment: Gait belt Activity Tolerance: Patient limited by fatigue Patient left: in bed;with call bell/phone within reach;with family/visitor present;with nursing/sitter in room   PT Visit Diagnosis: Unsteadiness on feet (R26.81);Other abnormalities of gait and mobility (R26.89);Muscle weakness (generalized) (M62.81)     Time: 4098-1191 PT Time Calculation (min) (ACUTE ONLY): 27 min  Charges:  $Gait Training: 8-22 mins $Therapeutic Activity: 8-22 mins                     Magda Kiel, PT Acute Rehabilitation Services YNWGN:[phone] Office:[phone] 10/14/2020    Reginia Naas 10/14/2020, 2:06 PM

## 2020-10-14 NOTE — Progress Notes (Signed)
Report called to Joellen Jersey, RN on 4E and pt transferred to [place] via wheelchair with SWOT RN Verdis Frederickson, accompanied by wife.

## 2020-10-14 NOTE — Progress Notes (Signed)
Pt received from [place]. VSS. Telemetry applied. CHG completed. Pt and wife oriented to room and unit. Call light in reach.  Clyde Canterbury, RN

## 2020-10-14 NOTE — Progress Notes (Signed)
.   Progress Note  Patient Name: Timothy Moran Date of Encounter: 10/14/2020  [facility] Cardiologist: Jenne Campus, MD   Subjective   The patient feels much better today, he is on chair, he started to eat pureed food.   Inpatient Medications    Scheduled Meds: . aspirin  81 mg Per Tube Daily  . atorvastatin  80 mg Per Tube Daily  . bisacodyl  10 mg Rectal Q0600  . chlorhexidine  15 mL Mouth Rinse BID  . Chlorhexidine Gluconate Cloth  6 each Topical Daily  . enoxaparin (LOVENOX) injection  30 mg Subcutaneous Q24H  . feeding supplement (PROSource TF)  45 mL Per Tube TID  . free water  200 mL Per Tube Q4H  . guaiFENesin  10 mL Per Tube Q4H  . insulin aspart  0-9 Units Subcutaneous Q4H  . isosorbide dinitrate  30 mg Per NG tube BID  . mouth rinse  15 mL Mouth Rinse q12n4p  . metoprolol succinate  25 mg Oral Daily  . senna-docusate  1 tablet Per Tube BID  . sodium chloride flush  3 mL Intravenous Q12H  . sorbitol  30 mL Per Tube Q0600  . Thrombi-Pad  1 each Topical Once  . ticagrelor  90 mg Per NG tube BID   Continuous Infusions: . sodium chloride Stopped (10/13/20 2304)  . ceFEPime (MAXIPIME) IV Stopped (10/13/20 2258)  . dexmedetomidine (PRECEDEX) IV infusion 1 mcg/kg/hr (10/14/20 0603)  . feeding supplement (OSMOLITE 1.5 CAL) 1,000 mL (10/14/20 0456)   PRN Meds: sodium chloride, acetaminophen (TYLENOL) oral liquid 160 mg/5 mL, albuterol, LORazepam, melatonin, morphine injection, oxyCODONE, sodium chloride flush, white petrolatum   Vital Signs    Vitals:   10/14/20 0500 10/14/20 0600 10/14/20 0700 10/14/20 0750  BP: 133/72 121/67 125/70   Pulse: (!) 59 (!) 108 62   Resp: (!) 29 (!) 23 (!) 26   Temp:    97.9 F (36.6 C)  TempSrc:    Oral  SpO2: 95% 94% 92%   Weight:      Height:        Intake/Output Summary (Last 24 hours) at 10/14/2020 0917 Last data filed at 10/14/2020 0602 Gross per 24 hour  Intake 3730.54 ml  Output 1175 ml  Net 2555.54 ml    Last 3 Weights 10/14/2020 10/13/2020 10/12/2020  Weight (lbs) 231 lb 14.8 oz 230 lb 9.6 oz 233 lb 11 oz  Weight (kg) 105.2 kg 104.6 kg 106 kg      Telemetry    Left bundle branch block, no VT, frequent PACs noted- Personally Reviewed  ECG    No new tracing today - Personally Reviewed  Physical Exam   GEN:  In bed, in mild distress sec to chest discomfort. Neck: No JVD, nasal cannula noted Cardiac: RRR, no murmurs, rubs, or gallops. Occasional ectopy Respiratory: Clear to auscultation bilaterally. GI: Soft, nontender, non-distended  MS: No edema; No deformity. Neuro:  Nonfocal  Psych: Normal affect   Labs    High Sensitivity Troponin:   Recent Labs  Lab 10/04/20 1808 10/04/20 2324 10/05/20 0500  TROPONINIHS 75* >27,000* >27,000*      Chemistry Recent Labs  Lab 10/08/20 0500 10/08/20 1635 10/09/20 0402 10/10/20 0311 10/11/20 0413 10/12/20 1151 10/13/20 0553 10/14/20 0542  NA 140   < > 144 147*   < > 153* 153* 151*  K 3.9   < > 3.6 3.5   < > 4.1 4.6 4.4  CL 108   < >  114* 117*   < > 123* 122* 122*  CO2 18*   < > 19* 19*   < > 19* 18* 18*  GLUCOSE 234*   < > 164* 176*   < > 122* 213* 210*  BUN 48*   < > 51* 60*   < > 59* 52* 53*  CREATININE 2.97*   < > 2.70* 2.66*   < > 2.39* 2.40* 2.25*  CALCIUM 8.5*   < > 7.9* 8.1*   < > 8.2* 8.7* 8.6*  PROT 6.2*  --  5.3* 5.7*  --   --   --   --   ALBUMIN 3.1*  --  2.4* 2.4*  --   --   --   --   AST 107*  --  50* 41  --   --   --   --   ALT 83*  --  53* 51*  --   --   --   --   ALKPHOS 45  --  39 47  --   --   --   --   BILITOT 1.3*  --  1.4* 1.5*  --   --   --   --   GFRNONAA 21*   < > 24* 24*   < > 27* 27* 29*  ANIONGAP 14   < > 11 11   < > 11 13 11    < > = values in this interval not displayed.     Hematology Recent Labs  Lab 10/10/20 0436 10/12/20 1356 10/13/20 0553  WBC 10.0 16.1* 15.5*  RBC 3.28* 3.24* 3.22*  HGB 9.8* 9.8* 9.8*  HCT 30.5* 31.6* 30.4*  MCV 93.0 97.5 94.4  MCH 29.9 30.2 30.4  MCHC 32.1  31.0 32.2  RDW 14.3 14.8 15.0  PLT 184 243 279    BNP Recent Labs  Lab 10/08/20 1456  BNP 1,434.0*     DDimer No results for input(s): DDIMER in the last 168 hours.   Radiology    No results found.  Cardiac Studies    Cath 10/04/20 Conclusions: 1. Significant two-vessel coronary artery disease including thrombotic occlusion of the distal LCx, which is the culprit for the patient's inferior STEMI as well as sequential 50% and 80% proximal/mid LAD lesions. 2. Mildly elevated left ventricular filling pressure (LVEDP 20-25 mmHg). 3. Successful PCI to distal LCx using Resolute Onyx 2.5 x 30 mm drug-eluting stent complicated by no reflow treated with intracoronary adenosine and nitroglycerin, as well as aggressive antiplatelet therapy. Final angiogram shows 0% residual stenosis with TIMI-2 flow.  Recommendations: 1. Admit to 2H-ICU for post STEMI/cardiac arrest monitoring. Consider targeted temperature management; critical care medicine has been consulted to assist with targeted temperature and ventilator management. 2. Continue tirofiban infusion for 6 hours. 3. Loaded with ticagrelor when patient arrives in ICU; patient should complete 12 months of dual antiplatelet therapy with aspirin and ticagrelor. 4. Obtain echocardiogram. 5. Aggressive secondary prevention of coronary artery disease including high intensity statin therapy. 6. Continue IV amiodarone overnight. 7. Consider staged PCI to LAD.  Echocardiogram 10/05/20 1. Since the study on 06/19/2020 LVEF has decreased, now severely impaired  at 20-25% and diffuse hypokinesis.  2. Left ventricular ejection fraction, by estimation, is 20 to 25%. The  left ventricle has severely decreased function. The left ventricle  demonstrates global hypokinesis. There is mild concentric left ventricular  hypertrophy. Left ventricular diastolic  parameters are consistent with Grade I diastolic dysfunction (impaired  relaxation).  3.  Right ventricular systolic function is normal. The right ventricular  size is normal.  4. The mitral valve is normal in structure. Mild mitral valve  regurgitation. No evidence of mitral stenosis.  5. The aortic valve is normal in structure. Aortic valve regurgitation is  trivial. No aortic stenosis is present.  6. The inferior vena cava is normal in size with greater than 50%  respiratory variability, suggesting right atrial pressure of 3 mmHg.    Patient Profile     75 y.o. male  ventricular fib [place] cardiac arrest with inferior STEMI on 10/04/2020 with ischemic cardiomyopathy EF 20 to 25% with successful stent placement to circumflex, considering staged PCI to LAD at a later date.  Complicated by increasing respiratory needs agitation delirium pneumonia acute kidney injury.   Assessment & Plan    Inferolateral STEMI/cardiac arrest/VF as initial rhythm -PCI to left circumflex.  Improved with nitro adenosine Aggrastat after initial no reflow.  EF now 20 to 25%.  He is on aspirin and ticagrelor, per NG tube -Consider staged PCI to LAD when can be done safely.  Kidney function agitation needs to improve, we will follow, possibly early next week. -Troponin greater than 27,000 -On atorvastatin.  LFTs are downtrending, elevated originally secondary to shock. -LDL 42, hemoglobin A1c 7.0 consistent with diabetes. -Amiodarone IV given VF arrest, post revascularization, we will discontinue. No further episodes of VF or VT noted. -Does not seem to be volume overloaded.  Stable.  Left bundle branch block bradycardia -Heart rate seems to be improving. -will added low-dose Toprol XL 25 mg yesterday  Chest discomfort -Describes a red chest pain central spreading across chest that resolves fairly quickly.   Could be musculoskeletal secondary to resuscitative efforts.  Could also be GERD related with CorPak in place.    Added on 1/29 isosorbide dinitrate 30 mg twice daily via NG tube. I will  add Tramadol PRN.  Acute kidney injury on top of chronic kidney disease stage IIIb with baseline creatinine around 1.7, slowly improving 2.7-> 2.4->2.25 -Slowly resolving, creatinine gently decreasing.  Holding on angiotensin receptor blocker.  Blood pressure would allow Entresto when able.  Diabetes with coronary artery disease -Hemoglobin A1c 7.0.  Diabetic coordinator -Consider SGLT2 inhibitor once stable.  Acute blood loss anemia -Recently had some oozing around the central line site that had resolved.  Hemoglobin drifted.  No evidence of bleeding.  Monitoring closely.  Prior history of PEs -Plan was for 3 months of anticoagulation with Eliquis.  Per patient wife he discontinue the Eliquis at home by himself.  Dr. Wendy Poet note mentions PE on 08/03/2020.  Acute chronic systolic heart failure -Was on valsartan as outpatient.  Add goal-directed medical therapy as he allows.  Renal function bradycardia blood pressure all inhibiting.  C-collar has been removed safely. Hypothermia protocol completed.  Continuing to work on speech therapy efforts - he is still NPO  The patient can be transferred to a progressive care unit.  For questions or updates, please contact [place] Please consult [email or web address] for contact info under     Signed, Ena Dawley, MD  10/14/2020, 9:17 AM

## 2020-10-14 NOTE — Progress Notes (Signed)
Occupational Therapy Treatment Patient Details Name: Timothy Moran MRN: [id number] DOB: May 07, 1946 Today's Date: 10/14/2020    History of present illness Pt is 75 year old male with a history of LBBB, mildly reduced LVEF, 4.2cm TAA, multiple symptomatic meningiomas (possible NF2 suspected), CKD stage III, two prior pulmonary embolisms (now off Eliquis), HTN, and pulmonary nodules, who presented as a resuscitated VF arrest at home and was found to have inferior ST elevation on ECG. He began having chest pain while shoveling snow this afternoon. He collapsed and lost consciousness with no obvious injuries in the fall. No bystander CPR performed. He was found with agonal breathing and an AED-shockable rhythm. He received a total of 4 defibrillations by fire and EMS.  Patient is more alert, but remains confused with decreased awareness and safety.  Pt s/p cardiac cath with stent on 1/23 and was intubated until 1/25.   OT comments  Patient continues to participate with therapies.  He was a little more confused this date.  Increased difficulty understanding why he needed help to get up and why he needed rehab.  OT and wife explained remote history and why OT/PT/ST is seeing him.  Patient is agreeable, wife thinks increased confusion is due to sleeping med this date.  Patient continues to present with the deficts noted below, all of which are impacting his independence with toileting and ADL.  Discharge plan was changed to CIR, and OT will continue to see him to progress him to the next level of care.      Follow Up Recommendations  CIR;Supervision/Assistance - 24 hour    Equipment Recommendations  Tub/shower seat    Recommendations for Other Services      Precautions / Restrictions Precautions Precautions: Fall Restrictions Weight Bearing Restrictions: No       Mobility Bed Mobility Overal bed mobility: Needs Assistance Bed Mobility: Supine to Sit     Supine to sit: Min guard         Transfers Overall transfer level: Needs assistance   Transfers: Sit to/from Stand;Stand Pivot Transfers Sit to Stand: Min assist Stand pivot transfers: Min assist       General transfer comment: HHA and gait belt    Balance Overall balance assessment: Needs assistance Sitting-balance support: No upper extremity supported;Feet supported Sitting balance-Leahy Scale: Fair   Postural control: Posterior lean Standing balance support: Single extremity supported Standing balance-Leahy Scale: Poor Standing balance comment: Impulsive                           ADL either performed or assessed with clinical judgement   ADL       Grooming: Wash/dry hands;Wash/dry face;Sitting;Set up               Lower Body Dressing: Minimal assistance;Sitting/lateral leans Lower Body Dressing Details (indicate cue type and reason): placing socks             Functional mobility during ADLs: Minimal assistance  Pertinent Vitals/ Pain       Pain Assessment: No/denies pain Pain Intervention(s): Monitored during session                                                          Frequency  Min 2X/week        Progress Toward Goals  OT Goals(current goals can now be found in the care plan section)  Progress towards OT goals: Progressing toward goals  Acute Rehab OT Goals Patient Stated Goal: continue moving I guess OT Goal Formulation: With patient Time For Goal Achievement: 10/24/20 Potential to Achieve Goals: Good  Plan Discharge plan remains appropriate    Co-evaluation                 AM-PAC OT "6 Clicks" Daily Activity     Outcome Measure   Help from another person eating meals?: A Little Help from another person taking care of personal grooming?: A Little Help from another person toileting, which  includes using toliet, bedpan, or urinal?: A Little Help from another person bathing (including washing, rinsing, drying)?: A Lot Help from another person to put on and taking off regular upper body clothing?: A Little Help from another person to put on and taking off regular lower body clothing?: A Lot 6 Click Score: 16    End of Session Equipment Utilized During Treatment: Gait belt  OT Visit Diagnosis: Unsteadiness on feet (R26.81);Pain;Other symptoms and signs involving cognitive function;Other abnormalities of gait and mobility (R26.89)   Activity Tolerance Patient tolerated treatment well   Patient Left in chair;with call bell/phone within reach;with chair alarm set;with family/visitor present   Nurse Communication          Time: [phone] OT Time Calculation (min): 22 min  Charges: OT General Charges $OT Visit: 1 Visit OT Treatments $Self Care/Home Management : 8-22 mins  10/14/2020  Rich, OTR/L  Acute Rehabilitation Services  Office:  [place] 10/14/2020, 9:19 AM

## 2020-10-15 ENCOUNTER — Inpatient Hospital Stay (HOSPITAL_COMMUNITY): Payer: Medicare Other

## 2020-10-15 DIAGNOSIS — I469 Cardiac arrest, cause unspecified: Secondary | ICD-10-CM | POA: Diagnosis not present

## 2020-10-15 DIAGNOSIS — D62 Acute posthemorrhagic anemia: Secondary | ICD-10-CM | POA: Diagnosis not present

## 2020-10-15 DIAGNOSIS — J9601 Acute respiratory failure with hypoxia: Secondary | ICD-10-CM | POA: Diagnosis not present

## 2020-10-15 DIAGNOSIS — N1831 Chronic kidney disease, stage 3a: Secondary | ICD-10-CM | POA: Diagnosis not present

## 2020-10-15 LAB — GLUCOSE, CAPILLARY
Glucose-Capillary: 162 mg/dL — ABNORMAL HIGH (ref 70–99)
Glucose-Capillary: 166 mg/dL — ABNORMAL HIGH (ref 70–99)
Glucose-Capillary: 180 mg/dL — ABNORMAL HIGH (ref 70–99)
Glucose-Capillary: 185 mg/dL — ABNORMAL HIGH (ref 70–99)
Glucose-Capillary: 193 mg/dL — ABNORMAL HIGH (ref 70–99)
Glucose-Capillary: 227 mg/dL — ABNORMAL HIGH (ref 70–99)

## 2020-10-15 MED ORDER — IPRATROPIUM-ALBUTEROL 0.5-2.5 (3) MG/3ML IN SOLN
3.0000 mL | RESPIRATORY_TRACT | Status: DC
  Administered 2020-10-15 – 2020-10-16 (×4): 3 mL via RESPIRATORY_TRACT
  Filled 2020-10-15 (×4): qty 3

## 2020-10-15 MED ORDER — ENSURE ENLIVE PO LIQD
237.0000 mL | Freq: Two times a day (BID) | ORAL | Status: DC
  Administered 2020-10-15 – 2020-10-19 (×9): 237 mL via ORAL

## 2020-10-15 MED ORDER — HALOPERIDOL LACTATE 5 MG/ML IJ SOLN
INTRAMUSCULAR | Status: AC
  Administered 2020-10-15: 2 mg via INTRAVENOUS
  Filled 2020-10-15: qty 1

## 2020-10-15 MED ORDER — HALOPERIDOL LACTATE 5 MG/ML IJ SOLN
2.0000 mg | Freq: Four times a day (QID) | INTRAMUSCULAR | Status: DC | PRN
  Administered 2020-10-16: 2 mg via INTRAVENOUS
  Filled 2020-10-15 (×2): qty 1

## 2020-10-15 MED ORDER — HYDRALAZINE HCL 25 MG PO TABS
25.0000 mg | ORAL_TABLET | Freq: Three times a day (TID) | ORAL | Status: DC
  Administered 2020-10-15 – 2020-10-16 (×3): 25 mg via ORAL
  Filled 2020-10-15 (×3): qty 1

## 2020-10-15 MED ORDER — ADULT MULTIVITAMIN W/MINERALS CH
1.0000 | ORAL_TABLET | Freq: Every day | ORAL | Status: DC
  Administered 2020-10-16 – 2020-10-29 (×14): 1 via ORAL
  Filled 2020-10-15 (×14): qty 1

## 2020-10-15 NOTE — Progress Notes (Signed)
Patient dislodged his PICC line breaking one of the lumens. He continues to pull off telemetry leads and is attempting to pull out coretrack. MD notified and order received for soft wrist restraints. Charge nurse placed order for 1:1 sitter. None currently available. Second dose of PRN Ativan administered. Wife notified by RN about restraint placement. She verbalized agreement with plan of care. Fuller Canada, RN

## 2020-10-15 NOTE — Progress Notes (Signed)
Pt extremely restless and agitated this shift with increasing confusion at night. PRN Ativan given with no effectiveness. MD called and notified. Order for 2mg  Haldol given and administered. Some improvement noted. Patient also c/o soreness in chest area (from CPR). PRN Roxicodone given. Effectiveness noted. Pt. Is being closely supervised by nursing staff. VS remain stable.Fuller Canada, RN

## 2020-10-15 NOTE — Progress Notes (Signed)
Occupational Therapy Treatment Patient Details Name: Timothy Moran MRN: [id number] DOB: 1945/09/30 Today's Date: 10/15/2020    History of present illness Pt is 75 year old male with a history of LBBB, mildly reduced LVEF, 4.2cm TAA, multiple symptomatic meningiomas (possible NF2 suspected), CKD stage III, two prior pulmonary embolisms (now off Eliquis), HTN, and pulmonary nodules, who presented as a resuscitated VF arrest at home and was found to have inferior ST elevation on ECG. He began having chest pain while shoveling snow this afternoon. He collapsed and lost consciousness with no obvious injuries in the fall. No bystander CPR performed. He was found with agonal breathing and an AED-shockable rhythm. He received a total of 4 defibrillations by fire and EMS.  Patient is more alert, but remains confused with decreased awareness and safety.  Pt s/p cardiac cath with stent on 1/23 and was intubated until 1/25.   OT comments  Patient has been moved to the progressive unit and has less lines and leads, but the rectal pouch remains.  His cognition continues to wax an wane, he is following commands, but remains confused about his current situation and the need for feeding tube and IV's.  He did have wrist restraints placed last night, but they were off when I entered the room with the spouse present.  He was [employer] for stand pivot this date, and the plan will to begin progressing his ADL status sinkside.  CIR continues to be recommended.        Follow Up Recommendations  CIR;Supervision/Assistance - 24 hour    Equipment Recommendations  Tub/shower seat    Recommendations for Other Services      Precautions / Restrictions Precautions Precautions: Fall Precaution Comments: core track, rectal pouch/flexiseal Restrictions Weight Bearing Restrictions: No       Mobility Bed Mobility Overal bed mobility: Needs Assistance Bed Mobility: Sit to Supine     Supine to sit: Min guard         Transfers Overall transfer level: Needs assistance   Transfers: Sit to/from Stand;Stand Pivot Transfers Sit to Stand: Min assist Stand pivot transfers: Min guard            Balance   Sitting-balance support: No upper extremity supported;Feet supported Sitting balance-Leahy Scale: Fair   Postural control: Posterior lean Standing balance support: Single extremity supported Standing balance-Leahy Scale: Poor                             ADL either performed or assessed with clinical judgement   ADL       Grooming: Wash/dry hands;Wash/dry face;Sitting;Set up;Oral care           Upper Body Dressing : Minimal assistance;Sitting   Lower Body Dressing: Minimal assistance;Sitting/lateral leans               Functional mobility during ADLs: Minimal assistance                         Cognition Arousal/Alertness: Awake/alert Behavior During Therapy: Impulsive Overall Cognitive Status: Impaired/Different from baseline                         Following Commands: Follows one step commands consistently Safety/Judgement: Decreased awareness of deficits   Problem Solving: Requires verbal cues          Exercises     Shoulder Instructions  General Comments      Pertinent Vitals/ Pain       Pain Assessment: Faces Faces Pain Scale: Hurts a little bit Pain Location: Soreness in chest from CPR; discomfort from flexiseal Pain Descriptors / Indicators: Tender Pain Intervention(s): Monitored during session                                                          Frequency  Min 2X/week        Progress Toward Goals  OT Goals(current goals can now be found in the care plan section)  Progress towards OT goals: Progressing toward goals  Acute Rehab OT Goals Patient Stated Goal: patient is ready for rectal pouch to be removed. OT Goal Formulation: With patient Time For Goal Achievement:  10/24/20 Potential to Achieve Goals: Good  Plan Discharge plan remains appropriate    Co-evaluation                 AM-PAC OT "6 Clicks" Daily Activity     Outcome Measure   Help from another person eating meals?: A Little Help from another person taking care of personal grooming?: A Little Help from another person toileting, which includes using toliet, bedpan, or urinal?: A Little Help from another person bathing (including washing, rinsing, drying)?: A Lot Help from another person to put on and taking off regular upper body clothing?: A Little Help from another person to put on and taking off regular lower body clothing?: A Little 6 Click Score: 17    End of Session Equipment Utilized During Treatment: Gait belt;Oxygen  OT Visit Diagnosis: Unsteadiness on feet (R26.81);Pain;Other symptoms and signs involving cognitive function;Other abnormalities of gait and mobility (R26.89)   Activity Tolerance Patient tolerated treatment well   Patient Left in chair;with call bell/phone within reach;with family/visitor present   Nurse Communication Mobility status        Time: 1209-1227 OT Time Calculation (min): 18 min  Charges: OT General Charges $OT Visit: 1 Visit OT Treatments $Self Care/Home Management : 8-22 mins  10/15/2020  Rich, OTR/L  Acute Rehabilitation Services  Office:  [place] 10/15/2020, 1:57 PM

## 2020-10-15 NOTE — Procedures (Signed)
Objective Swallowing Evaluation: Type of Study: FEES-Fiberoptic Endoscopic Evaluation of Swallow   Patient Details  Name: Timothy Moran MRN: [id number] Date of Birth: 11/23/1945  Today's Date: 10/15/2020 Time: SLP Start Time (ACUTE ONLY): 32 -SLP Stop Time (ACUTE ONLY): 1200  SLP Time Calculation (min) (ACUTE ONLY): 30 min   Past Medical History:  Past Medical History:  Diagnosis Date  . Abnormal echocardiogram 06/07/2019  . Arrhythmia   . Ascending aortic aneurysm (HCC) 4.2 cm based on CT from December 2020 01/29/2018   4.3 cm by echo in 2019  . Benign brain tumor ([place])   . Benign neoplasm of brain ([place]) 12/19/2017  . Chest pain 12/04/2017  . Chronic kidney disease    Stage 3  . Chronic kidney disease, stage III (moderate) ([place]) 12/19/2017  . Diabetes mellitus, type 2 ([place]) 10/06/2020   10/06/20 A1C 7%  . Dizziness 10/11/2019  . History of pulmonary embolus (PE)   . Hypertension   . Left bundle branch block 12/19/2017  . Nonsustained ventricular tachycardia ([place]) 10/11/2019  . Pain of left hip joint 05/29/2020  . Personal history of pulmonary embolism 12/19/2017  . Pulmonary hypertension due to thromboembolism ([place]) 03/09/2018   See echo 12/27/17 with nl PAS vs CTa chest 02/23/18   . Sinus bradycardia 12/22/2017  . Solitary pulmonary nodule on lung CT 03/08/2018   CT 12/04/17 1.0 x 0.8 x 0.7 cm nodular opacity in the RUL vs not seen 03/16/10 posterior segment of the right upper lobe. Marland KitchenSpirometry 03/08/2018    FEV1 3.86 (108%)  Ratio 96 s prior rx  - PET  03/13/18   Low grade c/w adenoca > rec T surgery eval     Past Surgical History:  Past Surgical History:  Procedure Laterality Date  . APPENDECTOMY    . CORONARY/GRAFT ACUTE MI REVASCULARIZATION N/A 10/04/2020   Procedure: Coronary/Graft Acute MI Revascularization;  Surgeon: Nelva Bush, MD;  Location: [facility];  Service: Cardiovascular;  Laterality: N/A;  . HERNIA REPAIR    . LEFT HEART CATH AND CORONARY ANGIOGRAPHY N/A  10/04/2020   Procedure: LEFT HEART CATH AND CORONARY ANGIOGRAPHY;  Surgeon: Nelva Bush, MD;  Location: [facility];  Service: Cardiovascular;  Laterality: N/A;   HPI: Pt is a 75 y.o. male who presented to the ED on 1/23 after STEMI/cardiac arrest. Pt was intubated from 10/04/20 to 10/06/20. CT Head from 10/04/20 showed no acute intracranial abnormality and recent CXRs show no signs of infection. CXR from 1/27 revealed multifocal airspace opacity. PMH of LBBB, multiple symptomatic meningiomas, CKD stage III, 2 prior pulmonary embolisms, HTN, pulmonary nodules, and bradycardia.   Subjective: Pt was cooperative    Assessment / Plan / Recommendation  CHL IP CLINICAL IMPRESSIONS 10/15/2020  Clinical Impression Pt demonstrates a mild oropharyngeal dysphagia with instances of sensed flash and frank penetration within nectar and thin liquids. Pts consistent throat clearing can be explained by this finding, which is likely baseline for him. He was observed to have a structural bulge just left of midline in his posterior pharyngeal wall that impacts epiglottic deflection and bolus flow. There is mild solid residue around this area and pt benefits from a liquid wash. Plan to report this finding to MD. No prior cervical spine images available in pts history. Pt may consume a mechanical soft diet and thin liquids. Will follow for tolerance.  SLP Visit Diagnosis Dysphagia, oropharyngeal phase (R13.12)  Attention and concentration deficit following --  Frontal lobe and executive function deficit following --  Impact on safety and function Mild aspiration risk      CHL IP TREATMENT RECOMMENDATION 10/15/2020  Treatment Recommendations Therapy as outlined in treatment plan below     Prognosis 10/07/2020  Prognosis for Safe Diet Advancement Fair  Barriers to Reach Goals Time post onset  Barriers/Prognosis Comment --    CHL IP DIET RECOMMENDATION 10/15/2020  SLP Diet Recommendations Dysphagia 3 (Mech  soft) solids;Thin liquid  Liquid Administration via Straw;Cup  Medication Administration Whole meds with liquid  Compensations Slow rate;Small sips/bites  Postural Changes Remain semi-upright after after feeds/meals (Comment)      CHL IP OTHER RECOMMENDATIONS 10/15/2020  Recommended Consults --  Oral Care Recommendations Oral care BID  Other Recommendations --      CHL IP FOLLOW UP RECOMMENDATIONS 10/15/2020  Follow up Recommendations Inpatient Rehab;24 hour supervision/assistance      CHL IP FREQUENCY AND DURATION 10/15/2020  Speech Therapy Frequency (ACUTE ONLY) min 2x/week  Treatment Duration 2 weeks           CHL IP ORAL PHASE 10/15/2020  Oral Phase WFL  Oral - Pudding Teaspoon --  Oral - Pudding Cup --  Oral - Honey Teaspoon --  Oral - Honey Cup --  Oral - Nectar Teaspoon --  Oral - Nectar Cup --  Oral - Nectar Straw --  Oral - Thin Teaspoon --  Oral - Thin Cup --  Oral - Thin Straw --  Oral - Puree --  Oral - Mech Soft --  Oral - Regular --  Oral - Multi-Consistency --  Oral - Pill --  Oral Phase - Comment --    CHL IP PHARYNGEAL PHASE 10/15/2020  Pharyngeal Phase Impaired  Pharyngeal- Pudding Teaspoon --  Pharyngeal --  Pharyngeal- Pudding Cup --  Pharyngeal --  Pharyngeal- Honey Teaspoon --  Pharyngeal --  Pharyngeal- Honey Cup --  Pharyngeal --  Pharyngeal- Nectar Teaspoon --  Pharyngeal --  Pharyngeal- Nectar Cup Penetration/Aspiration before swallow;Delayed swallow initiation-vallecula  Pharyngeal Material enters airway, remains ABOVE vocal cords then ejected out;Material does not enter airway  Pharyngeal- Nectar Straw WFL  Pharyngeal --  Pharyngeal- Thin Teaspoon --  Pharyngeal --  Pharyngeal- Thin Cup Delayed swallow initiation-vallecula;Penetration/Aspiration before swallow  Pharyngeal Material enters airway, CONTACTS cords and then ejected out;Material enters airway, remains ABOVE vocal cords then ejected out;Material does not enter airway   Pharyngeal- Thin Straw --  Pharyngeal --  Pharyngeal- Puree WFL  Pharyngeal --  Pharyngeal- Mechanical Soft --  Pharyngeal --  Pharyngeal- Regular Pharyngeal residue - valleculae;Pharyngeal residue - posterior pharnyx  Pharyngeal --  Pharyngeal- Multi-consistency --  Pharyngeal --  Pharyngeal- Pill --  Pharyngeal --  Pharyngeal Comment --     No flowsheet data found.  Herbie Baltimore, MA CCC-SLP  Acute Rehabilitation Services Pager [phone] Office [phone]  Lynann Beaver 10/15/2020, 12:39 PM

## 2020-10-15 NOTE — Progress Notes (Signed)
Central line removal: Received consult pt pulled CVC and broke one lumen. Site erythematous, pt denied pain at site. No drainage noted. Catheter removed due to lumen damage. Vaseline gauze and pressure dressing applied.

## 2020-10-15 NOTE — Care Management Important Message (Signed)
Important Message  Patient Details  Name: Timothy Moran MRN: [id number] Date of Birth: 1945-11-27   Medicare Important Message Given:  Yes     [name] 10/15/2020, 11:58 AM

## 2020-10-15 NOTE — Progress Notes (Signed)
Progress Note  Patient Name: Timothy Moran Date of Encounter: 10/15/2020  [facility] Cardiologist: Jenne Campus, MD   Subjective   The patient feels more SOB today. Per wife he seems more confused today.  Inpatient Medications    Scheduled Meds: . aspirin  81 mg Per Tube Daily  . atorvastatin  80 mg Per Tube Daily  . bisacodyl  10 mg Rectal Q0600  . chlorhexidine  15 mL Mouth Rinse BID  . Chlorhexidine Gluconate Cloth  6 each Topical Daily  . enoxaparin (LOVENOX) injection  40 mg Subcutaneous Q24H  . feeding supplement (PROSource TF)  45 mL Per Tube TID  . free water  200 mL Per Tube Q4H  . guaiFENesin  10 mL Per Tube Q4H  . hydrALAZINE  25 mg Oral Q8H  . insulin aspart  0-9 Units Subcutaneous Q4H  . ipratropium-albuterol  3 mL Nebulization Q4H  . isosorbide dinitrate  30 mg Per NG tube BID  . mouth rinse  15 mL Mouth Rinse q12n4p  . metoprolol succinate  25 mg Oral Daily  . senna-docusate  1 tablet Per Tube BID  . sodium chloride flush  3 mL Intravenous Q12H  . sorbitol  30 mL Per Tube Q0600  . Thrombi-Pad  1 each Topical Once  . ticagrelor  90 mg Per NG tube BID   Continuous Infusions: . sodium chloride Stopped (10/14/20 1029)  . dexmedetomidine (PRECEDEX) IV infusion Stopped (10/14/20 1027)  . feeding supplement (OSMOLITE 1.5 CAL) 1,000 mL (10/14/20 1706)   PRN Meds: sodium chloride, acetaminophen (TYLENOL) oral liquid 160 mg/5 mL, albuterol, haloperidol lactate, LORazepam, melatonin, morphine injection, oxyCODONE, sodium chloride flush, white petrolatum   Vital Signs    Vitals:   10/15/20 0355 10/15/20 0503 10/15/20 0758 10/15/20 1045  BP: (!) 162/88  (!) 155/83   Pulse: 77  85   Resp: 20  (!) 25 20  Temp: 97.9 F (36.6 C)  98.5 F (36.9 C)   TempSrc: Oral  Axillary   SpO2: 94%  94%   Weight:  101.3 kg    Height:        Intake/Output Summary (Last 24 hours) at 10/15/2020 1132 Last data filed at 10/15/2020 0402 Gross per 24 hour  Intake  2301.85 ml  Output 1700 ml  Net 601.85 ml   Last 3 Weights 10/15/2020 10/14/2020 10/13/2020  Weight (lbs) 223 lb 5.2 oz 231 lb 14.8 oz 230 lb 9.6 oz  Weight (kg) 101.3 kg 105.2 kg 104.6 kg     Telemetry    Left bundle branch block, no VT, frequent PACs noted- Personally Reviewed  ECG    No new tracing today - Personally Reviewed  Physical Exam   GEN:  In bed, in mild distress sec to chest discomfort. Neck: No JVD, nasal cannula noted Cardiac: RRR, no murmurs, rubs, or gallops. Occasional ectopy Respiratory: B/L wheezing GI: Soft, nontender, non-distended  MS: No edema; No deformity. Neuro:  Nonfocal  Psych: Normal affect   Labs    High Sensitivity Troponin:   Recent Labs  Lab 10/04/20 1808 10/04/20 2324 10/05/20 0500  TROPONINIHS 75* >27,000* >27,000*     Chemistry Recent Labs  Lab 10/09/20 0402 10/10/20 0311 10/11/20 0413 10/12/20 1151 10/13/20 0553 10/14/20 0542  NA 144 147*   < > 153* 153* 151*  K 3.6 3.5   < > 4.1 4.6 4.4  CL 114* 117*   < > 123* 122* 122*  CO2 19* 19*   < > 19*  18* 18*  GLUCOSE 164* 176*   < > 122* 213* 210*  BUN 51* 60*   < > 59* 52* 53*  CREATININE 2.70* 2.66*   < > 2.39* 2.40* 2.25*  CALCIUM 7.9* 8.1*   < > 8.2* 8.7* 8.6*  PROT 5.3* 5.7*  --   --   --   --   ALBUMIN 2.4* 2.4*  --   --   --   --   AST 50* 41  --   --   --   --   ALT 53* 51*  --   --   --   --   ALKPHOS 39 47  --   --   --   --   BILITOT 1.4* 1.5*  --   --   --   --   GFRNONAA 24* 24*   < > 27* 27* 29*  ANIONGAP 11 11   < > 11 13 11    < > = values in this interval not displayed.    Hematology Recent Labs  Lab 10/10/20 0436 10/12/20 1356 10/13/20 0553  WBC 10.0 16.1* 15.5*  RBC 3.28* 3.24* 3.22*  HGB 9.8* 9.8* 9.8*  HCT 30.5* 31.6* 30.4*  MCV 93.0 97.5 94.4  MCH 29.9 30.2 30.4  MCHC 32.1 31.0 32.2  RDW 14.3 14.8 15.0  PLT 184 243 279    BNP Recent Labs  Lab 10/08/20 1456  BNP 1,434.0*     DDimer No results for input(s): DDIMER in the last 168  hours.   Radiology    No results found.  Cardiac Studies    Cath 10/04/20 Conclusions: 1. Significant two-vessel coronary artery disease including thrombotic occlusion of the distal LCx, which is the culprit for the patient's inferior STEMI as well as sequential 50% and 80% proximal/mid LAD lesions. 2. Mildly elevated left ventricular filling pressure (LVEDP 20-25 mmHg). 3. Successful PCI to distal LCx using Resolute Onyx 2.5 x 30 mm drug-eluting stent complicated by no reflow treated with intracoronary adenosine and nitroglycerin, as well as aggressive antiplatelet therapy. Final angiogram shows 0% residual stenosis with TIMI-2 flow.  Recommendations: 1. Admit to 2H-ICU for post STEMI/cardiac arrest monitoring. Consider targeted temperature management; critical care medicine has been consulted to assist with targeted temperature and ventilator management. 2. Continue tirofiban infusion for 6 hours. 3. Loaded with ticagrelor when patient arrives in ICU; patient should complete 12 months of dual antiplatelet therapy with aspirin and ticagrelor. 4. Obtain echocardiogram. 5. Aggressive secondary prevention of coronary artery disease including high intensity statin therapy. 6. Continue IV amiodarone overnight. 7. Consider staged PCI to LAD.  Echocardiogram 10/05/20 1. Since the study on 06/19/2020 LVEF has decreased, now severely impaired  at 20-25% and diffuse hypokinesis.  2. Left ventricular ejection fraction, by estimation, is 20 to 25%. The  left ventricle has severely decreased function. The left ventricle  demonstrates global hypokinesis. There is mild concentric left ventricular  hypertrophy. Left ventricular diastolic  parameters are consistent with Grade I diastolic dysfunction (impaired  relaxation).  3. Right ventricular systolic function is normal. The right ventricular  size is normal.  4. The mitral valve is normal in structure. Mild mitral valve  regurgitation. No  evidence of mitral stenosis.  5. The aortic valve is normal in structure. Aortic valve regurgitation is  trivial. No aortic stenosis is present.  6. The inferior vena cava is normal in size with greater than 50%  respiratory variability, suggesting right atrial pressure of 3 mmHg.  Patient Profile     75 y.o. male  ventricular fib [place] cardiac arrest with inferior STEMI on 10/04/2020 with ischemic cardiomyopathy EF 20 to 25% with successful stent placement to circumflex, considering staged PCI to LAD at a later date.  Complicated by increasing respiratory needs agitation delirium pneumonia acute kidney injury.  Assessment & Plan    Inferolateral STEMI/cardiac arrest/VF as initial rhythm -PCI to left circumflex.  Improved with nitro adenosine Aggrastat after initial no reflow.  EF now 20 to 25%.  He is on aspirin and ticagrelor, per NG tube -Consider staged PCI to LAD when can be done safely. Possibly early next week. Kidney function continues to improve, we will follow. -Troponin greater than 27,000 -On atorvastatin.  LFTs are downtrending, elevated originally secondary to shock. -LDL 42, hemoglobin A1c 7.0 consistent with diabetes. -Amiodarone IV given VF arrest, post revascularization, we will discontinue. No further episodes of VF or VT noted. -Does not seem to be volume overloaded.  Stable. Weight continues to go down with no diuretics. - he is wheezing today and SOB, we will start breathing treatments with atrovent/albuterol (Douneb) nebulizer Q4H and O2 via nasal cannula.  - BP is elevated, I will add hydralazine 25 mg po TID in addition to imdur 30 mg po daily, I will avoid ACEI, ARB with ARI, also avoid increasing BB while he is wheezing  Left bundle branch block bradycardia -Heart rate seems to be improving. -will added low-dose Toprol XL 25 mg yesterday  Chest discomfort -Describes a red chest pain central spreading across chest that resolves fairly quickly.   Could be  musculoskeletal secondary to resuscitative efforts.  Could also be GERD related with CorPak in place.    Added on 1/29 isosorbide dinitrate 30 mg twice daily via NG tube. I will add Tramadol PRN.  Acute kidney injury on top of chronic kidney disease stage IIIb with baseline creatinine around 1.7, slowly improving 2.7-> 2.4->2.25-> pending today -Slowly resolving, creatinine gently decreasing.  Holding on angiotensin receptor blocker.  Blood pressure would allow Entresto when able.  Diabetes with coronary artery disease -Hemoglobin A1c 7.0.  Diabetic coordinator -Consider SGLT2 inhibitor once stable.  Acute blood loss anemia -Recently had some oozing around the central line site that had resolved.  Hemoglobin drifted.  No evidence of bleeding.  Monitoring closely.  Prior history of PEs -Plan was for 3 months of anticoagulation with Eliquis.  Per patient wife he discontinue the Eliquis at home by himself.  Dr. Wendy Poet note mentions PE on 08/03/2020.  Acute chronic systolic heart failure -Was on valsartan as outpatient.  Add goal-directed medical therapy as he allows.   For questions or updates, please contact [place] Please consult [email or web address] for contact info under     Signed, Ena Dawley, MD  10/15/2020, 11:32 AM

## 2020-10-15 NOTE — Progress Notes (Signed)
Nutrition Follow-up  DOCUMENTATION CODES:   Not applicable  INTERVENTION:   Hold tube feeding, monitor PO intake, continue free water flushes as patient remains hypernatremic    Ensure Enlive po BID, each supplement provides 350 kcal and 20 grams of protein  Magic cup TID with meals, each supplement provides 290 kcal and 9 grams of protein  MVI daily   NUTRITION DIAGNOSIS:   Inadequate oral intake related to inability to eat as evidenced by NPO status.  Diet advanced   GOAL:   Patient will meet greater than or equal to 90% of their needs  Meeting PO  MONITOR:   Diet advancement,Labs,Weight trends,I & O's  REASON FOR ASSESSMENT:   Ventilator,Consult Enteral/tube feeding initiation and management  ASSESSMENT:   75 year old male who presented to the ED on 1/23 after cardiac arrest. PMH of LBBB, multiple symptomatic meningiomas, CKD stage III, 2 prior pulmonary embolisms, HTN, pulmonary nodules. Pt found to have post-cardiac arrest STEMI s/p urgent PCI of distal LCx. TTM 36 degrees initiated.  1/23 - cardiac cath, stent, V.fib arrest 1/25 - extubated 1/26 - SLP recommending NPO, transferred out of ICU, later requiring BiPAP 1/28 - gastric Cortrak placed   Diet advanced to dysphagia 3 with thin liquids after FEES this am. Consumed 90% of his first meal. Discussed with cardiology, plan to hold TF and monitor PO. RD to provide supplementation to maxmize kcal and protein. Would continue free water flushes as patient remains hypernatremic.   Admission weight: 103 kg  Current weight: 101.3 kg   UOP: 1700 ml x 24 hrs   Medications: dulcolax, FWF 200 ml Q4 hours, SS novolog, senokot Labs: CBG 124-227 Na 151 (H)   Diet Order:   Diet Order            DIET DYS 3 Room service appropriate? Yes; Fluid consistency: Thin  Diet effective now                 EDUCATION NEEDS:   No education needs have been identified at this time  Skin:  Skin Assessment: Reviewed RN  Assessment  Last BM:  2/3  Height:   Ht Readings from Last 1 Encounters:  10/06/20 6\' 1"  (1.854 m)    Weight:   Wt Readings from Last 1 Encounters:  10/15/20 101.3 kg    BMI:  Body mass index is 29.46 kg/m.  Estimated Nutritional Needs:   Kcal:  2100-2300  Protein:  115-130 grams  Fluid:  2.0 L/day  Mariana Single RD, LDN Clinical Nutrition Pager listed in [place]

## 2020-10-16 DIAGNOSIS — N1831 Chronic kidney disease, stage 3a: Secondary | ICD-10-CM | POA: Diagnosis not present

## 2020-10-16 DIAGNOSIS — I469 Cardiac arrest, cause unspecified: Secondary | ICD-10-CM | POA: Diagnosis not present

## 2020-10-16 DIAGNOSIS — J9601 Acute respiratory failure with hypoxia: Secondary | ICD-10-CM | POA: Diagnosis not present

## 2020-10-16 DIAGNOSIS — D62 Acute posthemorrhagic anemia: Secondary | ICD-10-CM | POA: Diagnosis not present

## 2020-10-16 LAB — GLUCOSE, CAPILLARY
Glucose-Capillary: 111 mg/dL — ABNORMAL HIGH (ref 70–99)
Glucose-Capillary: 131 mg/dL — ABNORMAL HIGH (ref 70–99)
Glucose-Capillary: 149 mg/dL — ABNORMAL HIGH (ref 70–99)
Glucose-Capillary: 153 mg/dL — ABNORMAL HIGH (ref 70–99)
Glucose-Capillary: 160 mg/dL — ABNORMAL HIGH (ref 70–99)
Glucose-Capillary: 166 mg/dL — ABNORMAL HIGH (ref 70–99)
Glucose-Capillary: 167 mg/dL — ABNORMAL HIGH (ref 70–99)

## 2020-10-16 MED ORDER — HYDRALAZINE HCL 50 MG PO TABS
50.0000 mg | ORAL_TABLET | Freq: Three times a day (TID) | ORAL | Status: DC
  Administered 2020-10-16 – 2020-10-18 (×5): 50 mg via ORAL
  Filled 2020-10-16 (×6): qty 1

## 2020-10-16 MED ORDER — DIPHENHYDRAMINE HCL 25 MG PO CAPS
50.0000 mg | ORAL_CAPSULE | Freq: Every evening | ORAL | Status: DC | PRN
  Administered 2020-10-16: 50 mg via ORAL
  Filled 2020-10-16: qty 2

## 2020-10-16 NOTE — Progress Notes (Signed)
Inpatient Rehabilitation Admissions Coordinator  I will follow up next week to assist with disposition once medical work up is complete.  Danne Baxter, RN, MSN Rehab Admissions Coordinator [phone] 10/16/2020 3:49 PM

## 2020-10-16 NOTE — Progress Notes (Signed)
  Speech Language Pathology Treatment: Dysphagia  Patient Details Name: Timothy Moran MRN: [id number] DOB: Mar 13, 1946 Today's Date: 10/16/2020 Time: 2831-5176 SLP Time Calculation (min) (ACUTE ONLY): 16 min  Assessment / Plan / Recommendation Clinical Impression  Pt demonstrates tolerance of thin liquids, eating meals. Able to masticate soft solids and wife agrees cut up food is adequate. Assisted pt with brushing his teeth. Pt needed 3 verbal cues from SLP/Wife during task to complete appropriate, but generally he sustained attention and sequenced task well. Pt needs frequent orientation cues, demonstrated how to use environmental cued to wife. Pt likely to need f/u cognitive therapy at next level of care, recommend CIR at f/u. No further dysphagia needs at this time.   HPI HPI: Pt is a 75 y.o. male who presented to the ED on 1/23 after STEMI/cardiac arrest. Pt was intubated from 10/04/20 to 10/06/20. CT Head from 10/04/20 showed no acute intracranial abnormality and recent CXRs show no signs of infection. CXR from 1/27 revealed multifocal airspace opacity. PMH of LBBB, multiple symptomatic meningiomas, CKD stage III, 2 prior pulmonary embolisms, HTN, pulmonary nodules, and bradycardia.      SLP Plan          Recommendations  Diet recommendations: Dysphagia 3 (mechanical soft);Thin liquid Liquids provided via: Cup;Straw Medication Administration: Whole meds with liquid Supervision: Staff to assist with self feeding;Full supervision/cueing for compensatory strategies Compensations: Slow rate;Small sips/bites Postural Changes and/or Swallow Maneuvers: Seated upright 90 degrees                Follow up Recommendations: Inpatient Rehab SLP Visit Diagnosis: Dysphagia, unspecified (R13.10)       GO               Herbie Baltimore, MA CCC-SLP  Acute Rehabilitation Services Pager [phone] Office [phone]  Lynann Beaver 10/16/2020, 12:59 PM

## 2020-10-16 NOTE — Progress Notes (Signed)
Progress Note  Patient Name: Timothy Moran Date of Encounter: 10/16/2020  [facility] Cardiologist: Jenne Campus, MD   Subjective   The patient states he feels better, he passed swallow test yesterday, he was able to eat today, he was also able to walk 80 feet with physical therapy.  However remains confused, he is pulling his lines.  Inpatient Medications    Scheduled Meds: . aspirin  81 mg Per Tube Daily  . atorvastatin  80 mg Per Tube Daily  . bisacodyl  10 mg Rectal Q0600  . chlorhexidine  15 mL Mouth Rinse BID  . Chlorhexidine Gluconate Cloth  6 each Topical Daily  . enoxaparin (LOVENOX) injection  40 mg Subcutaneous Q24H  . feeding supplement  237 mL Oral BID BM  . feeding supplement (PROSource TF)  45 mL Per Tube TID  . free water  200 mL Per Tube Q4H  . guaiFENesin  10 mL Per Tube Q4H  . hydrALAZINE  25 mg Oral Q8H  . insulin aspart  0-9 Units Subcutaneous Q4H  . isosorbide dinitrate  30 mg Per NG tube BID  . mouth rinse  15 mL Mouth Rinse q12n4p  . metoprolol succinate  25 mg Oral Daily  . multivitamin with minerals  1 tablet Oral Daily  . senna-docusate  1 tablet Per Tube BID  . sodium chloride flush  3 mL Intravenous Q12H  . sorbitol  30 mL Per Tube Q0600  . Thrombi-Pad  1 each Topical Once  . ticagrelor  90 mg Per NG tube BID   Continuous Infusions: . sodium chloride Stopped (10/14/20 1029)  . dexmedetomidine (PRECEDEX) IV infusion Stopped (10/14/20 1027)  . feeding supplement (OSMOLITE 1.5 CAL) 1,000 mL (10/14/20 1706)   PRN Meds: sodium chloride, acetaminophen (TYLENOL) oral liquid 160 mg/5 mL, albuterol, haloperidol lactate, LORazepam, melatonin, morphine injection, oxyCODONE, sodium chloride flush, white petrolatum   Vital Signs    Vitals:   10/15/20 2350 10/16/20 0337 10/16/20 0749 10/16/20 1008  BP: 110/77 (!) 151/76 (!) 160/87 127/79  Pulse: 81 76 79   Resp: (!) 22 20 (!) 25   Temp: 98.6 F (37 C) 98.6 F (37 C) 97.7 F (36.5 C)  98.6 F (37 C)  TempSrc: Oral Oral Axillary Oral  SpO2: 96% 95% 100% 100%  Weight:      Height:        Intake/Output Summary (Last 24 hours) at 10/16/2020 1048 Last data filed at 10/16/2020 1015 Gross per 24 hour  Intake 150 ml  Output 1650 ml  Net -1500 ml   Last 3 Weights 10/15/2020 10/14/2020 10/13/2020  Weight (lbs) 223 lb 5.2 oz 231 lb 14.8 oz 230 lb 9.6 oz  Weight (kg) 101.3 kg 105.2 kg 104.6 kg     Telemetry    Left bundle branch block, no VT, frequent PACs noted- Personally Reviewed  ECG    No new tracing today - Personally Reviewed  Physical Exam   GEN:  In bed, in mild distress sec to chest discomfort. Neck: No JVD, nasal cannula noted Cardiac: RRR, no murmurs, rubs, or gallops. Occasional ectopy Respiratory: B/L wheezing GI: Soft, nontender, non-distended  MS: No edema; No deformity. Neuro:  Nonfocal  Psych: Normal affect   Labs    High Sensitivity Troponin:   Recent Labs  Lab 10/04/20 1808 10/04/20 2324 10/05/20 0500  TROPONINIHS 75* >27,000* >27,000*     Chemistry Recent Labs  Lab 10/10/20 0311 10/11/20 0413 10/12/20 1151 10/13/20 0553 10/14/20 0542  NA  147*   < > 153* 153* 151*  K 3.5   < > 4.1 4.6 4.4  CL 117*   < > 123* 122* 122*  CO2 19*   < > 19* 18* 18*  GLUCOSE 176*   < > 122* 213* 210*  BUN 60*   < > 59* 52* 53*  CREATININE 2.66*   < > 2.39* 2.40* 2.25*  CALCIUM 8.1*   < > 8.2* 8.7* 8.6*  PROT 5.7*  --   --   --   --   ALBUMIN 2.4*  --   --   --   --   AST 41  --   --   --   --   ALT 51*  --   --   --   --   ALKPHOS 47  --   --   --   --   BILITOT 1.5*  --   --   --   --   GFRNONAA 24*   < > 27* 27* 29*  ANIONGAP 11   < > 11 13 11    < > = values in this interval not displayed.    Hematology Recent Labs  Lab 10/10/20 0436 10/12/20 1356 10/13/20 0553  WBC 10.0 16.1* 15.5*  RBC 3.28* 3.24* 3.22*  HGB 9.8* 9.8* 9.8*  HCT 30.5* 31.6* 30.4*  MCV 93.0 97.5 94.4  MCH 29.9 30.2 30.4  MCHC 32.1 31.0 32.2  RDW 14.3 14.8 15.0  PLT  184 243 279    BNP No results for input(s): BNP, PROBNP in the last 168 hours.   DDimer No results for input(s): DDIMER in the last 168 hours.   Radiology    DG Chest 1 View  Result Date: 10/15/2020 CLINICAL DATA:  Shortness of breath EXAM: CHEST  1 VIEW COMPARISON:  October 08, 2018 FINDINGS: The cardiomediastinal silhouette is unchanged and enlarged in contour. The enteric tube courses through the chest to the abdomen beyond the field-of-view. No pleural effusion. No pneumothorax. Decreased bibasilar heterogeneous opacities. Mild residual raises [place] lower nodular opacities. Visualized abdomen is unremarkable. Multilevel degenerative changes of the thoracic spine. IMPRESSION: Persistent but decreased bibasilar heterogeneous opacities. Electronically Signed   By: Valentino Saxon MD   On: 10/15/2020 12:45   CT SOFT TISSUE NECK WO CONTRAST  Result Date: 10/15/2020 CLINICAL DATA:  Non pulsatile pharyngeal mass. EXAM: CT NECK WITHOUT CONTRAST TECHNIQUE: Multidetector CT imaging of the neck was performed following the standard protocol without intravenous contrast. COMPARISON:  None. FINDINGS: Pharynx and larynx: Mild prominence of the pharyngeal tonsil bilaterally without mass or abscess. Glottis is closed without mass. Calcification in the base of the epiglottis. NG extends into the esophagus. Salivary glands: No inflammation, mass, or stone. Thyroid: Negative Lymph nodes: No enlarged lymph nodes in the neck. Vascular: Limited vascular evaluation without intravenous contrast. Limited intracranial: Negative Visualized orbits: Negative Mastoids and visualized paranasal sinuses: Mild mucosal edema paranasal sinuses. Skeleton: Cervical spondylosis without acute abnormality. Lucency around left upper molars. Upper chest: Patchy bilateral airspace disease with small bilateral effusions. Other: None IMPRESSION: Mild prominence of the pharyngeal tonsils bilaterally without mass or abscess. Glottis is closed.  Negative for soft tissue mass or adenopathy in neck Patchy airspace disease in the upper lobes bilaterally with small effusions. Findings suggestive of pneumonia. Correlate with COVID status. Electronically Signed   By: Franchot Gallo M.D.   On: 10/15/2020 16:10    Cardiac Studies   Cath 10/04/20 Conclusions: 1. Significant two-vessel coronary artery  disease including thrombotic occlusion of the distal LCx, which is the culprit for the patient's inferior STEMI as well as sequential 50% and 80% proximal/mid LAD lesions. 2. Mildly elevated left ventricular filling pressure (LVEDP 20-25 mmHg). 3. Successful PCI to distal LCx using Resolute Onyx 2.5 x 30 mm drug-eluting stent complicated by no reflow treated with intracoronary adenosine and nitroglycerin, as well as aggressive antiplatelet therapy. Final angiogram shows 0% residual stenosis with TIMI-2 flow.  Recommendations: 1. Admit to 2H-ICU for post STEMI/cardiac arrest monitoring. Consider targeted temperature management; critical care medicine has been consulted to assist with targeted temperature and ventilator management. 2. Continue tirofiban infusion for 6 hours. 3. Loaded with ticagrelor when patient arrives in ICU; patient should complete 12 months of dual antiplatelet therapy with aspirin and ticagrelor. 4. Obtain echocardiogram. 5. Aggressive secondary prevention of coronary artery disease including high intensity statin therapy. 6. Continue IV amiodarone overnight. 7. Consider staged PCI to LAD.  Echocardiogram 10/05/20 1. Since the study on 06/19/2020 LVEF has decreased, now severely impaired  at 20-25% and diffuse hypokinesis.  2. Left ventricular ejection fraction, by estimation, is 20 to 25%. The  left ventricle has severely decreased function. The left ventricle  demonstrates global hypokinesis. There is mild concentric left ventricular  hypertrophy. Left ventricular diastolic  parameters are consistent with Grade I  diastolic dysfunction (impaired  relaxation).  3. Right ventricular systolic function is normal. The right ventricular  size is normal.  4. The mitral valve is normal in structure. Mild mitral valve  regurgitation. No evidence of mitral stenosis.  5. The aortic valve is normal in structure. Aortic valve regurgitation is  trivial. No aortic stenosis is present.  6. The inferior vena cava is normal in size with greater than 50%  respiratory variability, suggesting right atrial pressure of 3 mmHg.    Patient Profile     75 y.o. male  ventricular fib [place] cardiac arrest with inferior STEMI on 10/04/2020 with ischemic cardiomyopathy EF 20 to 25% with successful stent placement to circumflex, considering staged PCI to LAD at a later date.  Complicated by increasing respiratory needs agitation delirium pneumonia acute kidney injury.  Assessment & Plan    Inferolateral STEMI/cardiac arrest/VF as initial rhythm -PCI to left circumflex.  Improved with nitro adenosine Aggrastat after initial no reflow.  EF now 20 to 25%.  He is on aspirin and ticagrelor, per NG tube -Consider staged PCI to LAD when can be done safely. Possibly early next week. Kidney function continues to improve, we will follow. -Troponin greater than 27,000 -On atorvastatin.  LFTs are downtrending, elevated originally secondary to shock. -LDL 42, hemoglobin A1c 7.0 consistent with diabetes. -Amiodarone IV given VF arrest, post revascularization, we will discontinue. No further episodes of VF or VT noted. -Does not seem to be volume overloaded.  Stable. Weight continues to go down with no diuretics.  Current weight 101 kg combination of diuresis and poor oral intake in the last week.  He is now able to eat.  I would continue to hold of diuretics. - he is wheezing today and SOB, we will start breathing treatments with atrovent/albuterol (Douneb) nebulizer Q4H and O2 via nasal cannula.  - BP is elevated, I will increase  hydralazine to 50 mg po TID in addition to imdur 30 mg po daily, I will avoid ACEI, ARB with ARI, continue Toprol XL 25 mg daily. Left bundle branch block bradycardia -Heart rate seems to be improving. -The patient remains confused, this is a result  of anoxic brain injury, he is overall improving and he started to walk however is occasionally hallucinating, pulling lines, he does not sleep at night, nurse tried Haldol and Ativan without any improvement, we will try to start him on Benadryl at night.  Chest discomfort -Describes a red chest pain central spreading across chest that resolves fairly quickly.   Could be musculoskeletal secondary to resuscitative efforts.  Could also be GERD related with CorPak in place.    Added on 1/29 isosorbide dinitrate 30 mg twice daily via NG tube. I will add Tramadol PRN.  Acute kidney injury on top of chronic kidney disease stage IIIb with baseline creatinine around 1.7, slowly improving 2.7-> 2.4->2.25-> not drawn today, will obtain.  -Slowly resolving, creatinine gently decreasing.  Holding on angiotensin receptor blocker.  Blood pressure would allow Entresto when able.  Diabetes with coronary artery disease -Hemoglobin A1c 7.0.  Diabetic coordinator -Consider SGLT2 inhibitor once stable.  Acute blood loss anemia -Recently had some oozing around the central line site that had resolved.  Hemoglobin drifted.  No evidence of bleeding.  Monitoring closely.  Prior history of PEs -Plan was for 3 months of anticoagulation with Eliquis.  Per patient wife he discontinue the Eliquis at home by himself.  Dr. Wendy Poet note mentions PE on 08/03/2020.  Acute chronic systolic heart failure -Was on valsartan as outpatient.  Add goal-directed medical therapy as he allows.   For questions or updates, please contact [place] Please consult [email or web address] for contact info under     Signed, Ena Dawley, MD  10/16/2020, 10:48 AM

## 2020-10-16 NOTE — Progress Notes (Signed)
Physical Therapy Treatment Patient Details Name: Timothy Moran MRN: [id number] DOB: 1946-01-09 Today's Date: 10/16/2020    History of Present Illness Pt is 75 year old male with a history of LBBB, mildly reduced LVEF, 4.2cm TAA, multiple symptomatic meningiomas (possible NF2 suspected), CKD stage III, two prior pulmonary embolisms (now off Eliquis), HTN, and pulmonary nodules, who presented as a resuscitated VF arrest at home and was found to have inferior ST elevation on ECG. He began having chest pain while shoveling snow this afternoon. He collapsed and lost consciousness with no obvious injuries in the fall. No bystander CPR performed. He was found with agonal breathing and an AED-shockable rhythm. He received a total of 4 defibrillations by fire and EMS.  Patient is more alert, but remains confused with decreased awareness and safety.  Pt s/p cardiac cath with stent on 1/23 and was intubated until 1/25.    PT Comments    Pt received in supine, agreeable to therapy session with good participation and tolerance for mobility. Increased time needed for safety/sequencing and due to multiple lines needed +2-3 staff assist for safety. Pt with decreased short term memory and benefits from multimodal cues. Pt able to progress gait distance to 59ft using RW but needs +76modA for safety due to impulsivity and decreased balance, especially when distracted in hallway. Pt SpO2 WNL on 3L O2 [place] during trial but respiratory rate elevated to 30's rpm (variable) and needs cues for slow, deep breathing at end of session. Pt remained up in chair with alarm set for safety in care of SLP team member who entered room at end of PT treatment session. Pt continues to benefit from PT services to progress toward functional mobility goals. Continue to recommend CIR.   Follow Up Recommendations  CIR     Equipment Recommendations  Rolling walker with 5" wheels;Wheelchair (measurements PT);Wheelchair cushion (measurements  PT)    Recommendations for Other Services       Precautions / Restrictions Precautions Precautions: Fall Precaution Comments: rectal pouch/flexiseal, elevated RR Restrictions Weight Bearing Restrictions: No    Mobility  Bed Mobility Overal bed mobility: Needs Assistance Bed Mobility: Supine to Sit     Supine to sit: Min guard;+2 for safety/equipment Sit to supine: Min assist   General bed mobility comments: cues for safety/line mgmt, use of bed features  Transfers Overall transfer level: Needs assistance Equipment used: Rolling walker (2 wheeled) Transfers: Sit to/from Stand Sit to Stand: +2 safety/equipment;Min assist         General transfer comment: from EOB>RW, needs minA to steady once upright and needs minA for safety with stand>sit and tactile assist to reach for armrests, pt impulsive to sit in chair  Ambulation/Gait Ambulation/Gait assistance: Mod assist;+2 safety/equipment (+3 chair follow +2 for lines/balance) Gait Distance (Feet): 85 Feet (3 standing breaks) Assistive device: Rolling walker (2 wheeled) Gait Pattern/deviations: Step-through pattern;Decreased stride length;Trunk flexed;Drifts right/left Gait velocity: reduced   General Gait Details: LOB to R x2 and needs manual assist to manage RW safely/mod cues to remain inside RW and chair follow for safety, pt tends to lose balance when distracted by things happening around him/scanning environment   Stairs             Wheelchair Mobility    Modified Rankin (Stroke Patients Only)       Balance Overall balance assessment: Needs assistance Sitting-balance support: No upper extremity supported;Feet supported Sitting balance-Leahy Scale: Fair Sitting balance - Comments: Supervision at EOB   Standing balance support: Bilateral  upper extremity supported Standing balance-Leahy Scale: Poor Standing balance comment: relies on RW and x2 LOB, needs +2 for safety                             Cognition Arousal/Alertness: Awake/alert Behavior During Therapy: Impulsive Overall Cognitive Status: Impaired/Different from baseline Area of Impairment: Attention;Orientation;Memory;Following commands;Safety/judgement;Awareness;Problem solving                 Orientation Level: Disoriented to;Place;Time   Memory: Decreased short-term memory Following Commands: Follows one step commands consistently;Follows one step commands with increased time Safety/Judgement: Decreased awareness of deficits;Decreased awareness of safety   Problem Solving: Requires verbal cues;Difficulty sequencing General Comments: pt remains with wrist and mitt restraints as he has been pulling on lines in AM, but during session restraints removed for ease of mobility and with good following of 1-step commands; pt confused and unable to remember name of daughter (kept giving granddaughter's name when asked for daughter's name), spouse present and supportive      Exercises General Exercises - Lower Extremity Ankle Circles/Pumps: AROM;Both;10 reps;Supine Long Arc Quad: AROM;Both;10 reps;Seated Hip Flexion/Marching: AROM;10 reps;Both;Seated    General Comments General comments (skin integrity, edema, etc.): SpO2 100% on 3L O2 [place] (only in L nares due to cortrak OK per RN); respiratory rate elevated high 30's-40 rpm after gait trial, able to improve with cues      Pertinent Vitals/Pain Pain Assessment: Faces Faces Pain Scale: Hurts a little bit Pain Location: Soreness in chest from CPR; discomfort from flexiseal Pain Descriptors / Indicators: Aching Pain Intervention(s): Monitored during session;Repositioned    Home Living                      Prior Function            PT Goals (current goals can now be found in the care plan section) Acute Rehab PT Goals Patient Stated Goal: unable to state PT Goal Formulation: With patient/family Time For Goal Achievement: 10/24/20 Potential to  Achieve Goals: Good Progress towards PT goals: Progressing toward goals    Frequency    Min 3X/week      PT Plan Current plan remains appropriate    Co-evaluation              AM-PAC PT "6 Clicks" Mobility   Outcome Measure  Help needed turning from your back to your side while in a flat bed without using bedrails?: A Little Help needed moving from lying on your back to sitting on the side of a flat bed without using bedrails?: A Little Help needed moving to and from a bed to a chair (including a wheelchair)?: A Little Help needed standing up from a chair using your arms (e.g., wheelchair or bedside chair)?: A Little Help needed to walk in hospital room?: A Lot Help needed climbing 3-5 steps with a railing? : Total 6 Click Score: 15    End of Session Equipment Utilized During Treatment: Gait belt;Oxygen Activity Tolerance: Patient tolerated treatment well Patient left: in chair;with call bell/phone within reach;with chair alarm set;with nursing/sitter in room;with family/visitor present (speech therapist entering room) Nurse Communication: Mobility status PT Visit Diagnosis: Unsteadiness on feet (R26.81);Other abnormalities of gait and mobility (R26.89);Muscle weakness (generalized) (M62.81)     Time: 8182-9937 PT Time Calculation (min) (ACUTE ONLY): 42 min  Charges:  $Gait Training: 8-22 mins $Therapeutic Exercise: 8-22 mins $Therapeutic Activity: 8-22 mins  Houston Siren., PTA Acute Rehabilitation Services Pager: [phone] Office: [place] 10/16/2020, 2:59 PM

## 2020-10-16 NOTE — Progress Notes (Signed)
Occupational Therapy Treatment Patient Details Name: Timothy Moran MRN: [id number] DOB: 1946-02-27 Today's Date: 10/16/2020    History of present illness Pt is 75 year old male with a history of LBBB, mildly reduced LVEF, 4.2cm TAA, multiple symptomatic meningiomas (possible NF2 suspected), CKD stage III, two prior pulmonary embolisms (now off Eliquis), HTN, and pulmonary nodules, who presented as a resuscitated VF arrest at home and was found to have inferior ST elevation on ECG. He began having chest pain while shoveling snow this afternoon. He collapsed and lost consciousness with no obvious injuries in the fall. No bystander CPR performed. He was found with agonal breathing and an AED-shockable rhythm. He received a total of 4 defibrillations by fire and EMS.  Patient is more alert, but remains confused with decreased awareness and safety.  Pt s/p cardiac cath with stent on 1/23 and was intubated until 1/25.   OT comments  Patient continues to progress with transfers and mobility at RW level.  Nursing approached OT to inquire about walking patient.  Patient with restlessness and wanting to move.  OT able to walk patient in the halls with recliner following to increase balance, activity tolerance to increase independence with toileting skills.  OT also worked on cognition and balance; had patient scanning the halls and looking for various objects and room numbers, including his own room.  Patient with decreased balance when scanning and turning his head side to side.  Skilled OT will continue to follow in the acute setting, CIR continues to be appropriate.    Follow Up Recommendations  CIR;Supervision/Assistance - 24 hour    Equipment Recommendations  Tub/shower seat    Recommendations for Other Services      Precautions / Restrictions Precautions Precautions: Fall Precaution Comments: rectal pouch/flexiseal Restrictions Weight Bearing Restrictions: No       Mobility Bed  Mobility Overal bed mobility: Needs Assistance Bed Mobility: Supine to Sit;Sit to Supine     Supine to sit: Min guard Sit to supine: Min assist      Transfers Overall transfer level: Needs assistance   Transfers: Sit to/from Stand Sit to Stand: Min guard;From elevated surface              Balance   Sitting-balance support: No upper extremity supported;Feet supported Sitting balance-Leahy Scale: Fair     Standing balance support: Bilateral upper extremity supported Standing balance-Leahy Scale: Poor Standing balance comment: relies on RW                              Vision Patient Visual Report: No change from baseline     Perception     Praxis      Cognition Arousal/Alertness: Awake/alert Behavior During Therapy: Impulsive Overall Cognitive Status: Impaired/Different from baseline                         Following Commands: Follows one step commands consistently Safety/Judgement: Decreased awareness of deficits;Decreased awareness of safety   Problem Solving: Requires verbal cues                      General Comments O2 94% on 2 L, patient walked on RA, O2 dropped to 81%, able to rebound to 91%.  RN with OT during trial, O2 returned.    Pertinent Vitals/ Pain       Faces Pain Scale: Hurts a little bit Pain Location: Soreness in chest  from CPR; discomfort from flexiseal Pain Descriptors / Indicators: Aching Pain Intervention(s): Monitored during session                                                          Frequency  Min 2X/week        Progress Toward Goals  OT Goals(current goals can now be found in the care plan section)  Progress towards OT goals: Progressing toward goals  Acute Rehab OT Goals Patient Stated Goal: patient is ready for rectal pouch to be removed. OT Goal Formulation: With patient Time For Goal Achievement: 10/24/20 Potential to Achieve Goals: Good  Plan Discharge  plan remains appropriate    Co-evaluation                 AM-PAC OT "6 Clicks" Daily Activity     Outcome Measure   Help from another person eating meals?: A Little Help from another person taking care of personal grooming?: A Little Help from another person toileting, which includes using toliet, bedpan, or urinal?: A Little Help from another person bathing (including washing, rinsing, drying)?: A Lot Help from another person to put on and taking off regular upper body clothing?: A Little Help from another person to put on and taking off regular lower body clothing?: A Lot 6 Click Score: 16    End of Session Equipment Utilized During Treatment: Gait belt;Rolling walker  OT Visit Diagnosis: Unsteadiness on feet (R26.81);Pain;Other symptoms and signs involving cognitive function;Other abnormalities of gait and mobility (R26.89)   Activity Tolerance Patient limited by fatigue   Patient Left with call bell/phone within reach;with family/visitor present;in bed;with restraints reapplied   Nurse Communication Mobility status        Time: 1350-1410 OT Time Calculation (min): 20 min  Charges: OT General Charges $OT Visit: 1 Visit OT Treatments $Therapeutic Activity: 8-22 mins  10/16/2020  Rich, OTR/L  Acute Rehabilitation Services  Office:  [place] 10/16/2020, 2:28 PM

## 2020-10-17 DIAGNOSIS — D62 Acute posthemorrhagic anemia: Secondary | ICD-10-CM | POA: Diagnosis not present

## 2020-10-17 DIAGNOSIS — I469 Cardiac arrest, cause unspecified: Secondary | ICD-10-CM | POA: Diagnosis not present

## 2020-10-17 DIAGNOSIS — N1831 Chronic kidney disease, stage 3a: Secondary | ICD-10-CM | POA: Diagnosis not present

## 2020-10-17 DIAGNOSIS — J9601 Acute respiratory failure with hypoxia: Secondary | ICD-10-CM | POA: Diagnosis not present

## 2020-10-17 LAB — BASIC METABOLIC PANEL
Anion gap: 14 (ref 5–15)
BUN: 67 mg/dL — ABNORMAL HIGH (ref 8–23)
CO2: 14 mmol/L — ABNORMAL LOW (ref 22–32)
Calcium: 8.4 mg/dL — ABNORMAL LOW (ref 8.9–10.3)
Chloride: 116 mmol/L — ABNORMAL HIGH (ref 98–111)
Creatinine, Ser: 3.89 mg/dL — ABNORMAL HIGH (ref 0.61–1.24)
GFR, Estimated: 15 mL/min — ABNORMAL LOW (ref >60)
Glucose, Bld: 158 mg/dL — ABNORMAL HIGH (ref 70–99)
Potassium: 4.7 mmol/L (ref 3.5–5.1)
Sodium: 144 mmol/L (ref 135–145)

## 2020-10-17 LAB — GLUCOSE, CAPILLARY
Glucose-Capillary: 141 mg/dL — ABNORMAL HIGH (ref 70–99)
Glucose-Capillary: 142 mg/dL — ABNORMAL HIGH (ref 70–99)
Glucose-Capillary: 144 mg/dL — ABNORMAL HIGH (ref 70–99)
Glucose-Capillary: 144 mg/dL — ABNORMAL HIGH (ref 70–99)
Glucose-Capillary: 145 mg/dL — ABNORMAL HIGH (ref 70–99)
Glucose-Capillary: 167 mg/dL — ABNORMAL HIGH (ref 70–99)

## 2020-10-17 MED ORDER — ENOXAPARIN SODIUM 30 MG/0.3ML ~~LOC~~ SOLN
30.0000 mg | SUBCUTANEOUS | Status: DC
  Administered 2020-10-18 – 2020-10-25 (×8): 30 mg via SUBCUTANEOUS
  Filled 2020-10-17 (×8): qty 0.3

## 2020-10-17 MED ORDER — INSULIN ASPART 100 UNIT/ML ~~LOC~~ SOLN
0.0000 [IU] | Freq: Three times a day (TID) | SUBCUTANEOUS | Status: DC
  Administered 2020-10-17 – 2020-10-18 (×3): 2 [IU] via SUBCUTANEOUS
  Administered 2020-10-18: 5 [IU] via SUBCUTANEOUS
  Administered 2020-10-18 – 2020-10-19 (×2): 2 [IU] via SUBCUTANEOUS
  Administered 2020-10-19: 3 [IU] via SUBCUTANEOUS
  Administered 2020-10-19: 2 [IU] via SUBCUTANEOUS
  Administered 2020-10-20 – 2020-10-22 (×4): 3 [IU] via SUBCUTANEOUS
  Administered 2020-10-22: 2 [IU] via SUBCUTANEOUS
  Administered 2020-10-23: 5 [IU] via SUBCUTANEOUS
  Administered 2020-10-23 – 2020-10-24 (×2): 2 [IU] via SUBCUTANEOUS
  Administered 2020-10-24: 3 [IU] via SUBCUTANEOUS
  Administered 2020-10-24 – 2020-10-25 (×4): 2 [IU] via SUBCUTANEOUS

## 2020-10-17 MED ORDER — INSULIN ASPART 100 UNIT/ML ~~LOC~~ SOLN: 0.0000 [IU] | Freq: Every day | SUBCUTANEOUS | Status: DC

## 2020-10-17 NOTE — Progress Notes (Signed)
Mobility Specialist - Progress Note   10/17/20 1456  Mobility  Activity Ambulated in room  Level of Assistance Minimal assist, patient does 75% or more  Assistive Device Front wheel walker  Distance Ambulated (ft) 24 ft (12 ft x 2)  Mobility Response Tolerated poorly  Mobility performed by Mobility specialist  $Mobility charge 1 Mobility   Pre-mobility: 84 HR, 127/74 BP, 95% SpO2 During mobility: 101 HR, 96% SpO2 Post-mobility: 98 HR, 112/101 BP, 96% SpO2  Pt stand-by assist to stand from bed w/ bed height elevated. Ambulated on 2L O2. He required frequent verbal cues for hand placement on RW as well as proximity to the RW. Pt c/o dizziness while ambulating and required a seated rest break. RR up to 39 during mobility, low 20s at rest. Pt back in bed after walk, sitter and wife in room.   Pricilla Handler Mobility Specialist Mobility Specialist Phone: [phone]

## 2020-10-17 NOTE — Progress Notes (Signed)
Progress Note  Patient Name: Timothy Moran Date of Encounter: 10/17/2020  [facility] Cardiologist: Jenne Campus, MD   Subjective   He slept better last night, he is still confused. Now sleeping.  Inpatient Medications    Scheduled Meds: . aspirin  81 mg Per Tube Daily  . atorvastatin  80 mg Per Tube Daily  . bisacodyl  10 mg Rectal Q0600  . chlorhexidine  15 mL Mouth Rinse BID  . Chlorhexidine Gluconate Cloth  6 each Topical Daily  . enoxaparin (LOVENOX) injection  40 mg Subcutaneous Q24H  . feeding supplement  237 mL Oral BID BM  . guaiFENesin  10 mL Per Tube Q4H  . hydrALAZINE  50 mg Oral Q8H  . insulin aspart  0-9 Units Subcutaneous Q4H  . isosorbide dinitrate  30 mg Per NG tube BID  . mouth rinse  15 mL Mouth Rinse q12n4p  . metoprolol succinate  25 mg Oral Daily  . multivitamin with minerals  1 tablet Oral Daily  . senna-docusate  1 tablet Per Tube BID  . sodium chloride flush  3 mL Intravenous Q12H  . sorbitol  30 mL Per Tube Q0600  . ticagrelor  90 mg Per NG tube BID   Continuous Infusions: . sodium chloride Stopped (10/14/20 1029)   PRN Meds: sodium chloride, acetaminophen (TYLENOL) oral liquid 160 mg/5 mL, albuterol, diphenhydrAMINE, haloperidol lactate, LORazepam, melatonin, morphine injection, oxyCODONE, sodium chloride flush, white petrolatum   Vital Signs    Vitals:   10/17/20 0001 10/17/20 0433 10/17/20 0700 10/17/20 0825  BP: 112/63 126/76  136/78  Pulse: 99 91  93  Resp: 20 20  (!) 22  Temp: 97.8 F (36.6 C) 98 F (36.7 C) (!) 97.5 F (36.4 C) (!) 97.5 F (36.4 C)  TempSrc: Oral Oral Oral Oral  SpO2: 99% 100%  94%  Weight:  99.8 kg    Height:        Intake/Output Summary (Last 24 hours) at 10/17/2020 1011 Last data filed at 10/17/2020 0829 Gross per 24 hour  Intake --  Output 1725 ml  Net -1725 ml   Last 3 Weights 10/17/2020 10/15/2020 10/14/2020  Weight (lbs) 220 lb 223 lb 5.2 oz 231 lb 14.8 oz  Weight (kg) 99.791 kg 101.3 kg  105.2 kg     Telemetry    Left bundle branch block, no VT, frequent PACs noted- Personally Reviewed  ECG    No new tracing today - Personally Reviewed  Physical Exam   GEN:  In bed, in mild distress sec to chest discomfort. Neck: No JVD, nasal cannula noted Cardiac: RRR, no murmurs, rubs, or gallops. Occasional ectopy Respiratory: B/L wheezing GI: Soft, nontender, non-distended  MS: No edema; No deformity. Neuro:  Nonfocal  Psych: Normal affect   Labs    High Sensitivity Troponin:   Recent Labs  Lab 10/04/20 1808 10/04/20 2324 10/05/20 0500  TROPONINIHS 75* >27,000* >27,000*     Chemistry Recent Labs  Lab 10/12/20 1151 10/13/20 0553 10/14/20 0542  NA 153* 153* 151*  K 4.1 4.6 4.4  CL 123* 122* 122*  CO2 19* 18* 18*  GLUCOSE 122* 213* 210*  BUN 59* 52* 53*  CREATININE 2.39* 2.40* 2.25*  CALCIUM 8.2* 8.7* 8.6*  GFRNONAA 27* 27* 29*  ANIONGAP 11 13 11     Hematology Recent Labs  Lab 10/12/20 1356 10/13/20 0553  WBC 16.1* 15.5*  RBC 3.24* 3.22*  HGB 9.8* 9.8*  HCT 31.6* 30.4*  MCV 97.5 94.4  MCH  30.2 30.4  MCHC 31.0 32.2  RDW 14.8 15.0  PLT 243 279    BNP No results for input(s): BNP, PROBNP in the last 168 hours.   DDimer No results for input(s): DDIMER in the last 168 hours.   Radiology    DG Chest 1 View  Result Date: 10/15/2020 CLINICAL DATA:  Shortness of breath EXAM: CHEST  1 VIEW COMPARISON:  October 08, 2018 FINDINGS: The cardiomediastinal silhouette is unchanged and enlarged in contour. The enteric tube courses through the chest to the abdomen beyond the field-of-view. No pleural effusion. No pneumothorax. Decreased bibasilar heterogeneous opacities. Mild residual raises Duke lower nodular opacities. Visualized abdomen is unremarkable. Multilevel degenerative changes of the thoracic spine. IMPRESSION: Persistent but decreased bibasilar heterogeneous opacities. Electronically Signed   By: Valentino Saxon MD   On: 10/15/2020 12:45   CT  SOFT TISSUE NECK WO CONTRAST  Result Date: 10/15/2020 CLINICAL DATA:  Non pulsatile pharyngeal mass. EXAM: CT NECK WITHOUT CONTRAST TECHNIQUE: Multidetector CT imaging of the neck was performed following the standard protocol without intravenous contrast. COMPARISON:  None. FINDINGS: Pharynx and larynx: Mild prominence of the pharyngeal tonsil bilaterally without mass or abscess. Glottis is closed without mass. Calcification in the base of the epiglottis. NG extends into the esophagus. Salivary glands: No inflammation, mass, or stone. Thyroid: Negative Lymph nodes: No enlarged lymph nodes in the neck. Vascular: Limited vascular evaluation without intravenous contrast. Limited intracranial: Negative Visualized orbits: Negative Mastoids and visualized paranasal sinuses: Mild mucosal edema paranasal sinuses. Skeleton: Cervical spondylosis without acute abnormality. Lucency around left upper molars. Upper chest: Patchy bilateral airspace disease with small bilateral effusions. Other: None IMPRESSION: Mild prominence of the pharyngeal tonsils bilaterally without mass or abscess. Glottis is closed. Negative for soft tissue mass or adenopathy in neck Patchy airspace disease in the upper lobes bilaterally with small effusions. Findings suggestive of pneumonia. Correlate with COVID status. Electronically Signed   By: Franchot Gallo M.D.   On: 10/15/2020 16:10    Cardiac Studies   Cath 10/04/20 Conclusions: 1. Significant two-vessel coronary artery disease including thrombotic occlusion of the distal LCx, which is the culprit for the patient's inferior STEMI as well as sequential 50% and 80% proximal/mid LAD lesions. 2. Mildly elevated left ventricular filling pressure (LVEDP 20-25 mmHg). 3. Successful PCI to distal LCx using Resolute Onyx 2.5 x 30 mm drug-eluting stent complicated by no reflow treated with intracoronary adenosine and nitroglycerin, as well as aggressive antiplatelet therapy. Final angiogram shows 0%  residual stenosis with TIMI-2 flow.  Recommendations: 1. Admit to 2H-ICU for post STEMI/cardiac arrest monitoring. Consider targeted temperature management; critical care medicine has been consulted to assist with targeted temperature and ventilator management. 2. Continue tirofiban infusion for 6 hours. 3. Loaded with ticagrelor when patient arrives in ICU; patient should complete 12 months of dual antiplatelet therapy with aspirin and ticagrelor. 4. Obtain echocardiogram. 5. Aggressive secondary prevention of coronary artery disease including high intensity statin therapy. 6. Continue IV amiodarone overnight. 7. Consider staged PCI to LAD.  Echocardiogram 10/05/20 1. Since the study on 06/19/2020 LVEF has decreased, now severely impaired  at 20-25% and diffuse hypokinesis.  2. Left ventricular ejection fraction, by estimation, is 20 to 25%. The  left ventricle has severely decreased function. The left ventricle  demonstrates global hypokinesis. There is mild concentric left ventricular  hypertrophy. Left ventricular diastolic  parameters are consistent with Grade I diastolic dysfunction (impaired  relaxation).  3. Right ventricular systolic function is normal. The right ventricular  size is normal.  4. The mitral valve is normal in structure. Mild mitral valve  regurgitation. No evidence of mitral stenosis.  5. The aortic valve is normal in structure. Aortic valve regurgitation is  trivial. No aortic stenosis is present.  6. The inferior vena cava is normal in size with greater than 50%  respiratory variability, suggesting right atrial pressure of 3 mmHg.    Patient Profile     75 y.o. male  ventricular fib [place] cardiac arrest with inferior STEMI on 10/04/2020 with ischemic cardiomyopathy EF 20 to 25% with successful stent placement to circumflex, considering staged PCI to LAD at a later date.  Complicated by increasing respiratory needs agitation delirium pneumonia acute  kidney injury.  Assessment & Plan    Inferolateral STEMI/cardiac arrest/VF as initial rhythm  -PCI to left circumflex.  Improved with nitro adenosine Aggrastat after initial no reflow.  EF now 20 to 25%.  He is on aspirin and ticagrelor, per NG tube -Consider staged PCI to LAD when can be done safely. Possibly early next week. Kidney function continues to improve, we will follow. -Troponin greater than 27,000 -On atorvastatin.  LFTs are downtrending, elevated originally secondary to shock. -LDL 42, hemoglobin A1c 7.0 consistent with diabetes. -Amiodarone IV given VF arrest, post revascularization, we will discontinue. No further episodes of VF or VT noted. -Does not seem to be volume overloaded.  Stable. Weight continues to go down with no diuretics.  Current weight 101 kg combination of diuresis and poor oral intake in the last week.  He is now able to eat.  I would continue to hold of diuretics. - he was wheezing yesterday, now improved post breathing treatments - BP is improved after increasing hydralazine to 50 mg po TID in addition to imdur 30 mg po daily, I will avoid ACEI, ARB with ARI, continue Toprol XL 25 mg daily. -The patient remains confused, this is a result of anoxic brain injury, he is overall improving and he started to walk however is occasionally hallucinating, pulling lines, he does not sleep at night, nurse tried Haldol and Ativan without any improvement, we will try to start him on Benadryl at night. - the main goal for the weekend would be mobilization and to improve his oral intake  Chest discomfort -Describes a red chest pain central spreading across chest that resolves fairly quickly.   Could be musculoskeletal secondary to resuscitative efforts.  Could also be GERD related with CorPak in place.    Added on 1/29 isosorbide dinitrate 30 mg twice daily via NG tube. I will add Tramadol PRN.  Acute kidney injury on top of chronic kidney disease stage IIIb with baseline  creatinine around 1.7, slowly improving 2.7-> 2.4->2.25-> not drawn today, will obtain.  -Slowly resolving, creatinine gently decreasing.  Holding on angiotensin receptor blocker.  Blood pressure would allow Entresto when able.  Diabetes with coronary artery disease -Hemoglobin A1c 7.0.  Diabetic coordinator -Consider SGLT2 inhibitor once stable.  Acute blood loss anemia -Recently had some oozing around the central line site that had resolved.  Hemoglobin drifted.  No evidence of bleeding.  Monitoring closely.  Prior history of PEs -Plan was for 3 months of anticoagulation with Eliquis.  Per patient wife he discontinue the Eliquis at home by himself.  Dr. Wendy Poet note mentions PE on 08/03/2020.  Acute chronic systolic heart failure -Was on valsartan as outpatient.  Add goal-directed medical therapy as he allows.   For questions or updates, please contact [place] Please  consult [email or web address] for contact info under     Signed, Ena Dawley, MD  10/17/2020, 10:11 AM

## 2020-10-18 ENCOUNTER — Inpatient Hospital Stay (HOSPITAL_COMMUNITY): Payer: Medicare Other

## 2020-10-18 DIAGNOSIS — J9601 Acute respiratory failure with hypoxia: Secondary | ICD-10-CM | POA: Diagnosis not present

## 2020-10-18 DIAGNOSIS — I469 Cardiac arrest, cause unspecified: Secondary | ICD-10-CM | POA: Diagnosis not present

## 2020-10-18 DIAGNOSIS — D62 Acute posthemorrhagic anemia: Secondary | ICD-10-CM | POA: Diagnosis not present

## 2020-10-18 DIAGNOSIS — N179 Acute kidney failure, unspecified: Secondary | ICD-10-CM | POA: Diagnosis not present

## 2020-10-18 DIAGNOSIS — I10 Essential (primary) hypertension: Secondary | ICD-10-CM

## 2020-10-18 LAB — CREATININE, URINE, RANDOM: Creatinine, Urine: 73.97 mg/dL

## 2020-10-18 LAB — CBC
HCT: 29.4 % — ABNORMAL LOW (ref 39.0–52.0)
Hemoglobin: 9.5 g/dL — ABNORMAL LOW (ref 13.0–17.0)
MCH: 30.6 pg (ref 26.0–34.0)
MCHC: 32.3 g/dL (ref 30.0–36.0)
MCV: 94.8 fL (ref 80.0–100.0)
Platelets: 443 10*3/uL — ABNORMAL HIGH (ref 150–400)
RBC: 3.1 MIL/uL — ABNORMAL LOW (ref 4.22–5.81)
RDW: 14.5 % (ref 11.5–15.5)
WBC: 21.2 10*3/uL — ABNORMAL HIGH (ref 4.0–10.5)
nRBC: 0 % (ref 0.0–0.2)

## 2020-10-18 LAB — URINALYSIS, ROUTINE W REFLEX MICROSCOPIC
Bilirubin Urine: NEGATIVE
Glucose, UA: NEGATIVE mg/dL
Ketones, ur: NEGATIVE mg/dL
Nitrite: NEGATIVE
Protein, ur: 30 mg/dL — AB
Specific Gravity, Urine: 1.012 (ref 1.005–1.030)
pH: 5 (ref 5.0–8.0)

## 2020-10-18 LAB — GLUCOSE, CAPILLARY
Glucose-Capillary: 111 mg/dL — ABNORMAL HIGH (ref 70–99)
Glucose-Capillary: 121 mg/dL — ABNORMAL HIGH (ref 70–99)
Glucose-Capillary: 131 mg/dL — ABNORMAL HIGH (ref 70–99)
Glucose-Capillary: 145 mg/dL — ABNORMAL HIGH (ref 70–99)
Glucose-Capillary: 149 mg/dL — ABNORMAL HIGH (ref 70–99)
Glucose-Capillary: 205 mg/dL — ABNORMAL HIGH (ref 70–99)

## 2020-10-18 LAB — SODIUM, URINE, RANDOM: Sodium, Ur: 65 mmol/L

## 2020-10-18 LAB — BASIC METABOLIC PANEL
Anion gap: 16 — ABNORMAL HIGH (ref 5–15)
BUN: 81 mg/dL — ABNORMAL HIGH (ref 8–23)
CO2: 13 mmol/L — ABNORMAL LOW (ref 22–32)
Calcium: 8.3 mg/dL — ABNORMAL LOW (ref 8.9–10.3)
Chloride: 113 mmol/L — ABNORMAL HIGH (ref 98–111)
Creatinine, Ser: 5.02 mg/dL — ABNORMAL HIGH (ref 0.61–1.24)
GFR, Estimated: 11 mL/min — ABNORMAL LOW (ref >60)
Glucose, Bld: 157 mg/dL — ABNORMAL HIGH (ref 70–99)
Potassium: 4.7 mmol/L (ref 3.5–5.1)
Sodium: 142 mmol/L (ref 135–145)

## 2020-10-18 MED ORDER — HYDRALAZINE HCL 25 MG PO TABS
25.0000 mg | ORAL_TABLET | Freq: Three times a day (TID) | ORAL | Status: DC
  Administered 2020-10-18 – 2020-10-29 (×32): 25 mg via ORAL
  Filled 2020-10-18 (×32): qty 1

## 2020-10-18 MED ORDER — SIMETHICONE 80 MG PO CHEW
80.0000 mg | CHEWABLE_TABLET | Freq: Four times a day (QID) | ORAL | Status: DC | PRN
  Administered 2020-10-18 – 2020-10-21 (×3): 80 mg via ORAL
  Filled 2020-10-18 (×3): qty 1

## 2020-10-18 MED ORDER — FUROSEMIDE 10 MG/ML IJ SOLN
40.0000 mg | Freq: Two times a day (BID) | INTRAMUSCULAR | Status: DC
  Administered 2020-10-18 – 2020-10-19 (×3): 40 mg via INTRAVENOUS
  Filled 2020-10-18 (×3): qty 4

## 2020-10-18 NOTE — Progress Notes (Signed)
10/18/20 1200  OT Visit Information  Last OT Received On 10/18/20  Assistance Needed +2 (safety/lines)  History of Present Illness Pt is 75 year old male with a history of LBBB, mildly reduced LVEF, 4.2cm TAA, multiple symptomatic meningiomas (possible NF2 suspected), CKD stage III, two prior pulmonary embolisms (now off Eliquis), HTN, and pulmonary nodules, who presented as a resuscitated VF arrest at home and was found to have inferior ST elevation on ECG. He began having chest pain while shoveling snow this afternoon. He collapsed and lost consciousness with no obvious injuries in the fall. No bystander CPR performed. He was found with agonal breathing and an AED-shockable rhythm. He received a total of 4 defibrillations by fire and EMS.  Patient is more alert, but remains confused with decreased awareness and safety.  Pt s/p cardiac cath with stent on 1/23 and was intubated until 1/25.  Precautions  Precautions Fall  Precaution Comments impulsive  Pain Assessment  Pain Assessment No/denies pain  Cognition  Arousal/Alertness Awake/alert  Behavior During Therapy Impulsive  Overall Cognitive Status Impaired/Different from baseline  Area of Impairment Attention;Following commands;Safety/judgement;Awareness;Problem solving  Current Attention Level Sustained  Following Commands Follows one step commands consistently;Follows one step commands with increased time  Safety/Judgement Decreased awareness of deficits;Decreased awareness of safety  Problem Solving Requires verbal cues;Difficulty sequencing  ADL  Overall ADL's  Needs assistance/impaired  Toilet Transfer Minimal assistance;Stand-pivot;BSC;RW  Toileting- Clothing Manipulation and Hygiene Total assistance  Bed Mobility  Overal bed mobility Needs Assistance  Bed Mobility Supine to Sit  Supine to sit Min assist  General bed mobility comments tactile cues for LEs over EOB, pulled up on therapist's hand to raise trunk  Balance   Overall balance assessment Needs assistance  Sitting balance-Leahy Scale Fair  Standing balance support Bilateral upper extremity supported  Standing balance-Leahy Scale Poor  Transfers  Overall transfer level Needs assistance  Equipment used Rolling walker (2 wheeled)  Transfers Sit to/from Stand  Sit to Stand Min assist  Stand pivot transfers Min assist  General transfer comment cues for hand placement and safety, elevated height of bed, assist to rise and steady  OT - End of Session  Equipment Utilized During Treatment Gait belt;Rolling walker  Activity Tolerance Patient tolerated treatment well  Patient left Other (comment);with call bell/phone within reach (on [facility] with wife)  Nurse Communication Mobility status  OT Assessment/Plan  OT Plan Discharge plan remains appropriate  OT Visit Diagnosis Unsteadiness on feet (R26.81);Pain;Other symptoms and signs involving cognitive function;Other abnormalities of gait and mobility (R26.89)  OT Frequency (ACUTE ONLY) Min 2X/week  Follow Up Recommendations CIR;Supervision/Assistance - 24 hour  OT Equipment Tub/shower seat  AM-PAC OT "6 Clicks" Daily Activity Outcome Measure (Version 2)  Help from another person eating meals? 3  Help from another person taking care of personal grooming? 3  Help from another person toileting, which includes using toliet, bedpan, or urinal? 3  Help from another person bathing (including washing, rinsing, drying)? 2  Help from another person to put on and taking off regular upper body clothing? 3  Help from another person to put on and taking off regular lower body clothing? 2  6 Click Score 16  OT Goal Progression  Progress towards OT goals Progressing toward goals  Acute Rehab OT Goals  Patient Stated Goal to use [facility]  OT Goal Formulation With patient  Time For Goal Achievement 10/24/20  Potential to Achieve Goals Good  OT Time Calculation  OT Start Time (ACUTE ONLY)  0940  OT Stop Time (ACUTE ONLY)  0950  OT Time Calculation (min) 10 min  OT General Charges  $OT Visit 1 Visit  OT Treatments  $Self Care/Home Management  8-22 mins  Nestor Lewandowsky, OTR/L Acute Rehabilitation Services Pager: [phone] Office: [phone]

## 2020-10-18 NOTE — Progress Notes (Signed)
Progress Note  Patient Name: Timothy Moran Date of Encounter: 10/18/2020  [facility] Cardiologist: Jenne Campus, MD   Subjective   He slept better last night, he is still confused. Now sleeping.  Inpatient Medications    Scheduled Meds: . aspirin  81 mg Per Tube Daily  . atorvastatin  80 mg Per Tube Daily  . bisacodyl  10 mg Rectal Q0600  . chlorhexidine  15 mL Mouth Rinse BID  . Chlorhexidine Gluconate Cloth  6 each Topical Daily  . enoxaparin (LOVENOX) injection  30 mg Subcutaneous Q24H  . feeding supplement  237 mL Oral BID BM  . guaiFENesin  10 mL Per Tube Q4H  . hydrALAZINE  50 mg Oral Q8H  . insulin aspart  0-15 Units Subcutaneous TID WC  . insulin aspart  0-5 Units Subcutaneous QHS  . isosorbide dinitrate  30 mg Per NG tube BID  . mouth rinse  15 mL Mouth Rinse q12n4p  . metoprolol succinate  25 mg Oral Daily  . multivitamin with minerals  1 tablet Oral Daily  . senna-docusate  1 tablet Per Tube BID  . sodium chloride flush  3 mL Intravenous Q12H  . sorbitol  30 mL Per Tube Q0600  . ticagrelor  90 mg Per NG tube BID   Continuous Infusions: . sodium chloride Stopped (10/14/20 1029)   PRN Meds: sodium chloride, acetaminophen (TYLENOL) oral liquid 160 mg/5 mL, albuterol, diphenhydrAMINE, haloperidol lactate, LORazepam, melatonin, morphine injection, oxyCODONE, sodium chloride flush, white petrolatum   Vital Signs    Vitals:   10/17/20 2350 10/18/20 0300 10/18/20 0756 10/18/20 1042  BP: 123/80 126/79 134/89 121/72  Pulse: 84 81 80 75  Resp: 18 18 20 20   Temp: 98.7 F (37.1 C) 98.7 F (37.1 C) 98.1 F (36.7 C)   TempSrc: Oral Oral Oral   SpO2: 98% 97% 96% 95%  Weight:  102.1 kg    Height:        Intake/Output Summary (Last 24 hours) at 10/18/2020 1053 Last data filed at 10/18/2020 0854 Gross per 24 hour  Intake -  Output 760 ml  Net -760 ml   Last 3 Weights 10/18/2020 10/17/2020 10/15/2020  Weight (lbs) 225 lb 1.4 oz 220 lb 223 lb 5.2 oz   Weight (kg) 102.1 kg 99.791 kg 101.3 kg     Telemetry    Left bundle branch block, no VT, frequent PACs noted- Personally Reviewed  ECG    No new tracing today - Personally Reviewed  Physical Exam   GEN:  In bed, in mild distress sec to chest discomfort. Neck: No JVD, nasal cannula noted Cardiac: RRR, no murmurs, rubs, or gallops. Occasional ectopy Respiratory: B/L wheezing GI: Soft, nontender, non-distended  MS: No edema; No deformity. Neuro:  Nonfocal  Psych: Normal affect   Labs    High Sensitivity Troponin:   Recent Labs  Lab 10/04/20 1808 10/04/20 2324 10/05/20 0500  TROPONINIHS 75* >27,000* >27,000*     Chemistry Recent Labs  Lab 10/14/20 0542 10/17/20 1420 10/18/20 0720  NA 151* 144 142  K 4.4 4.7 4.7  CL 122* 116* 113*  CO2 18* 14* 13*  GLUCOSE 210* 158* 157*  BUN 53* 67* 81*  CREATININE 2.25* 3.89* 5.02*  CALCIUM 8.6* 8.4* 8.3*  GFRNONAA 29* 15* 11*  ANIONGAP 11 14 16*    Hematology Recent Labs  Lab 10/12/20 1356 10/13/20 0553 10/18/20 0720  WBC 16.1* 15.5* 21.2*  RBC 3.24* 3.22* 3.10*  HGB 9.8* 9.8* 9.5*  HCT 31.6* 30.4* 29.4*  MCV 97.5 94.4 94.8  MCH 30.2 30.4 30.6  MCHC 31.0 32.2 32.3  RDW 14.8 15.0 14.5  PLT 243 279 443*    BNP No results for input(s): BNP, PROBNP in the last 168 hours.   DDimer No results for input(s): DDIMER in the last 168 hours.   Radiology    No results found.  Cardiac Studies   Cath 10/04/20 Conclusions: 1. Significant two-vessel coronary artery disease including thrombotic occlusion of the distal LCx, which is the culprit for the patient's inferior STEMI as well as sequential 50% and 80% proximal/mid LAD lesions. 2. Mildly elevated left ventricular filling pressure (LVEDP 20-25 mmHg). 3. Successful PCI to distal LCx using Resolute Onyx 2.5 x 30 mm drug-eluting stent complicated by no reflow treated with intracoronary adenosine and nitroglycerin, as well as aggressive antiplatelet therapy. Final  angiogram shows 0% residual stenosis with TIMI-2 flow.  Recommendations: 1. Admit to 2H-ICU for post STEMI/cardiac arrest monitoring. Consider targeted temperature management; critical care medicine has been consulted to assist with targeted temperature and ventilator management. 2. Continue tirofiban infusion for 6 hours. 3. Loaded with ticagrelor when patient arrives in ICU; patient should complete 12 months of dual antiplatelet therapy with aspirin and ticagrelor. 4. Obtain echocardiogram. 5. Aggressive secondary prevention of coronary artery disease including high intensity statin therapy. 6. Continue IV amiodarone overnight. 7. Consider staged PCI to LAD.  Echocardiogram 10/05/20 1. Since the study on 06/19/2020 LVEF has decreased, now severely impaired  at 20-25% and diffuse hypokinesis.  2. Left ventricular ejection fraction, by estimation, is 20 to 25%. The  left ventricle has severely decreased function. The left ventricle  demonstrates global hypokinesis. There is mild concentric left ventricular  hypertrophy. Left ventricular diastolic  parameters are consistent with Grade I diastolic dysfunction (impaired  relaxation).  3. Right ventricular systolic function is normal. The right ventricular  size is normal.  4. The mitral valve is normal in structure. Mild mitral valve  regurgitation. No evidence of mitral stenosis.  5. The aortic valve is normal in structure. Aortic valve regurgitation is  trivial. No aortic stenosis is present.  6. The inferior vena cava is normal in size with greater than 50%  respiratory variability, suggesting right atrial pressure of 3 mmHg.    Patient Profile     75 y.o. male  ventricular fib [place] cardiac arrest with inferior STEMI on 10/04/2020 with ischemic cardiomyopathy EF 20 to 25% with successful stent placement to circumflex, considering staged PCI to LAD at a later date.  Complicated by increasing respiratory needs agitation delirium  pneumonia acute kidney injury.    Assessment & Plan    Inferolateral STEMI/cardiac arrest/VF as initial rhythm  -PCI to left circumflex.  Improved with nitro adenosine Aggrastat after initial no reflow.  EF now 20 to 25%.  He is on aspirin and ticagrelor, per NG tube -Consider staged PCI to LAD when can be done safely. Currently prohibitive because of kidney function. -Troponin greater than 27,000 -On atorvastatin.  LFTs are downtrending, elevated originally secondary to shock. -LDL 42, hemoglobin A1c 7.0 consistent with diabetes. -Amiodarone IV given VF arrest, post revascularization, we will discontinue. No further episodes of VF or VT noted. -Does not seem to be volume overloaded.  Stable. Weight continues to go down with no diuretics.  Current weight 101 kg combination of diuresis and poor oral intake in the last week.  He is now able to eat.  I would continue to hold of diuretics. -  he was wheezing yesterday, now improved post breathing treatments - BP is improved after increasing hydralazine to 50 mg po TID in addition to imdur 30 mg po daily, I will avoid ACEI, ARB with ARI, continue Toprol XL 25 mg daily.Worsening kidney function, I will decrease hydralzine to improve kidney perfusion.  -The patient remains confused, this is a result of anoxic brain injury, he is overall improving and he started to walk however is occasionally hallucinating, pulling lines, he does not sleep at night, nurse tried Haldol and Ativan without any improvement, we will try to start him on Benadryl at night. - the main goal for the weekend would be mobilization and to improve his oral intake  Chest discomfort -Describes a red chest pain central spreading across chest that resolves fairly quickly.   Could be musculoskeletal secondary to resuscitative efforts.  Could also be GERD related with CorPak in place.    Added on 1/29 isosorbide dinitrate 30 mg twice daily via NG tube. I will add Tramadol PRN.  Acute  kidney injury on top of chronic kidney disease stage IIIb with baseline creatinine around 1.7, slowly improving 2.7-> 2.4->2.25->3.89->5.02  not drawn today, will obtain. - this is  Dramatic change, we will consult nephrology - Holding on angiotensin receptor blocker.  Acute combined syst and diast CHF - his weight is up and he appears more SOB, I will give Lasix 40 mg iv BID  Diabetes with coronary artery disease -Hemoglobin A1c 7.0.  Diabetic coordinator -Consider SGLT2 inhibitor once stable.  Acute blood loss anemia -Recently had some oozing around the central line site that had resolved.  Hemoglobin drifted.  No evidence of bleeding.  Monitoring closely.  Prior history of PEs -Plan was for 3 months of anticoagulation with Eliquis.  Per patient wife he discontinue the Eliquis at home by himself.  Dr. Wendy Poet note mentions PE on 08/03/2020.  Acute chronic systolic heart failure -Was on valsartan as outpatient.  Add goal-directed medical therapy as he allows.   For questions or updates, please contact [place] Please consult [email or web address] for contact info under     Signed, Ena Dawley, MD  10/18/2020, 10:53 AM

## 2020-10-18 NOTE — Consult Note (Signed)
Reason for Consult: Acute kidney injury on chronic kidney disease stage III Referring Physician: Ena Dawley, MD (cardiology)  HPI:  75 year old Caucasian man with past medical history significant for type 2 diabetes mellitus, hypertension, left bundle branch block, congestive heart failure with reduced ejection fraction, multiple symptomatic meningiomas, prior history of PE and chronic kidney disease stage III at baseline (creatinine 1.4-1.5).  He was admitted to the hospital about 2 weeks ago with complaints of chest pain provoked by shoveling snow.  He then had a witnessed collapse without any bystander CPR and after downtime of 2 to 3 minutes, found to have agonal breathing with AED shockable rhythm by EMS.  Upon presentation to the emergency room on 1/23, found to have ST elevation in the inferior and apical leads for which she was taken for emergent coronary angiogram and found to have thrombotic occlusion of a large dominant distal circumflex treated with DES.  He was extubated on 1/25.  Post coronary angiogram, creatinine appears to have peaked at 2.97 on 1/27 thereafter trending down to 2.2 on 10/14/2020 and when rechecked yesterday (2/5) was 3.9 and today is up to 5.0.  Also has worsening anion gap metabolic acidosis with normal potassium in the setting of ongoing furosemide 40 mg twice daily.  Urine output over the last 24 hours was 1.05 L and so far today has been 110 cc.  He is noted to have had some fluctuating blood pressures from as high as 185/92 on 2/3 2 as low as 93/62 today.  Past Medical History:  Diagnosis Date  . Abnormal echocardiogram 06/07/2019  . Arrhythmia   . Ascending aortic aneurysm (HCC) 4.2 cm based on CT from December 2020 01/29/2018   4.3 cm by echo in 2019  . Benign brain tumor ([place])   . Benign neoplasm of brain ([place]) 12/19/2017  . Chest pain 12/04/2017  . Chronic kidney disease    Stage 3  . Chronic kidney disease, stage III (moderate) ([place]) 12/19/2017  . Diabetes  mellitus, type 2 ([place]) 10/06/2020   10/06/20 A1C 7%  . Dizziness 10/11/2019  . History of pulmonary embolus (PE)   . Hypertension   . Left bundle branch block 12/19/2017  . Nonsustained ventricular tachycardia ([place]) 10/11/2019  . Pain of left hip joint 05/29/2020  . Personal history of pulmonary embolism 12/19/2017  . Pulmonary hypertension due to thromboembolism ([place]) 03/09/2018   See echo 12/27/17 with nl PAS vs CTa chest 02/23/18   . Sinus bradycardia 12/22/2017  . Solitary pulmonary nodule on lung CT 03/08/2018   CT 12/04/17 1.0 x 0.8 x 0.7 cm nodular opacity in the RUL vs not seen 03/16/10 posterior segment of the right upper lobe. [place] KitchenSpirometry 03/08/2018    FEV1 3.86 (108%)  Ratio 96 s prior rx  - PET  03/13/18   Low grade c/w adenoca > rec T surgery eval      Past Surgical History:  Procedure Laterality Date  . APPENDECTOMY    . CORONARY/GRAFT ACUTE MI REVASCULARIZATION N/A 10/04/2020   Procedure: Coronary/Graft Acute MI Revascularization;  Surgeon: Nelva Bush, MD;  Location: [facility];  Service: Cardiovascular;  Laterality: N/A;  . HERNIA REPAIR    . LEFT HEART CATH AND CORONARY ANGIOGRAPHY N/A 10/04/2020   Procedure: LEFT HEART CATH AND CORONARY ANGIOGRAPHY;  Surgeon: Nelva Bush, MD;  Location: [facility];  Service: Cardiovascular;  Laterality: N/A;    Family History  Problem Relation Age of Onset  . Diabetes Mother   .  Brain cancer Sister   . Melanoma Brother        Radiation Exposure  . Leukemia Brother        Agent Orange    Social History:  reports that he quit smoking about 52 years ago. His smoking use included cigarettes. He has a 2.50 pack-year smoking history. He has never used smokeless tobacco. He reports current alcohol use. He reports that he does not use drugs.  Allergies: No Known Allergies  Medications:  Scheduled: . aspirin  81 mg Per Tube Daily  . atorvastatin  80 mg Per Tube Daily  . bisacodyl  10 mg Rectal Q0600  . chlorhexidine  15 mL  Mouth Rinse BID  . Chlorhexidine Gluconate Cloth  6 each Topical Daily  . enoxaparin (LOVENOX) injection  30 mg Subcutaneous Q24H  . feeding supplement  237 mL Oral BID BM  . furosemide  40 mg Intravenous BID  . guaiFENesin  10 mL Per Tube Q4H  . hydrALAZINE  25 mg Oral Q8H  . insulin aspart  0-15 Units Subcutaneous TID WC  . insulin aspart  0-5 Units Subcutaneous QHS  . isosorbide dinitrate  30 mg Per NG tube BID  . mouth rinse  15 mL Mouth Rinse q12n4p  . metoprolol succinate  25 mg Oral Daily  . multivitamin with minerals  1 tablet Oral Daily  . senna-docusate  1 tablet Per Tube BID  . sodium chloride flush  3 mL Intravenous Q12H  . sorbitol  30 mL Per Tube Q0600  . ticagrelor  90 mg Per NG tube BID   BMP Latest Ref Rng & Units 10/18/2020 10/17/2020 10/14/2020  Glucose 70 - 99 mg/dL 920(F) 007(H) 219(X)  BUN 8 - 23 mg/dL 58(I) 32(P) 49(I)  Creatinine 0.61 - 1.24 mg/dL 2.64(B) 5.83(E) 9.40(H)  BUN/Creat Ratio 10 - 24 - - -  Sodium 135 - 145 mmol/L 142 144 151(H)  Potassium 3.5 - 5.1 mmol/L 4.7 4.7 4.4  Chloride 98 - 111 mmol/L 113(H) 116(H) 122(H)  CO2 22 - 32 mmol/L 13(L) 14(L) 18(L)  Calcium 8.9 - 10.3 mg/dL 8.3(L) 8.4(L) 8.6(L)   CBC Latest Ref Rng & Units 10/18/2020 10/13/2020 10/12/2020  WBC 4.0 - 10.5 K/uL 21.2(H) 15.5(H) 16.1(H)  Hemoglobin 13.0 - 17.0 g/dL 6.8(G) 8.8(P) 1.0(R)  Hematocrit 39.0 - 52.0 % 29.4(L) 30.4(L) 31.6(L)  Platelets 150 - 400 K/uL 443(H) 279 243     DG Chest 1 View  Result Date: 10/18/2020 CLINICAL DATA:  CHF. EXAM: CHEST  1 VIEW COMPARISON:  10/15/2020; 10/08/2018; 1/23/2 FINDINGS: Grossly unchanged enlarged cardiac silhouette and mediastinal contours with prominence of the pulmonary vasculature. Pulmonary vasculature remains indistinct with cephalization of flow. Grossly unchanged perihilar and bilateral medial basilar heterogeneous opacities. No new focal airspace opacities. No definite pleural effusion or pneumothorax. No acute osseous abnormalities.  Degenerative change of the right glenohumeral and acromioclavicular joints is suspected though incompletely evaluated. IMPRESSION: Similar findings of cardiomegaly, pulmonary edema and perihilar/bibasilar atelectasis. Electronically Signed   By: Simonne Come M.D.   On: 10/18/2020 12:54    Review of Systems  Constitutional: Positive for appetite change and fatigue. Negative for chills and fever.  HENT: Negative for congestion, ear pain, hearing loss, nosebleeds, sinus pressure and sinus pain.   Eyes: Negative for photophobia, pain and redness.  Respiratory: Negative for cough, choking and shortness of breath.   Cardiovascular: Positive for chest pain.  Gastrointestinal: Negative.   Endocrine: Negative.   Genitourinary: Positive for difficulty urinating.  Musculoskeletal: Negative.  Neurological: Negative.    Blood pressure 121/71, pulse 78, temperature 98.6 F (37 C), temperature source Oral, resp. rate 20, height 6\' 1"  (1.854 m), weight 102.1 kg, SpO2 97 %. Physical Exam Vitals and nursing note reviewed.  Constitutional:      General: He is not in acute distress.    Appearance: Normal appearance. He is normal weight. He is not ill-appearing.     Comments: Somnolent but awakens to conversation with appropriate responses  HENT:     Head: Normocephalic and atraumatic.     Right Ear: External ear normal.     Left Ear: External ear normal.     Nose: Nose normal.     Mouth/Throat:     Mouth: Mucous membranes are dry.  Eyes:     General: No scleral icterus.    Conjunctiva/sclera: Conjunctivae normal.  Cardiovascular:     Rate and Rhythm: Normal rate and regular rhythm.     Pulses: Normal pulses.     Heart sounds: Normal heart sounds. No murmur heard.   Pulmonary:     Effort: Pulmonary effort is normal.     Breath sounds: Normal breath sounds. No wheezing or rales.  Abdominal:     General: Abdomen is flat.     Palpations: Abdomen is soft.     Tenderness: There is no abdominal  tenderness.  Musculoskeletal:     Cervical back: Normal range of motion and neck supple.     Right lower leg: No edema.     Left lower leg: No edema.  Skin:    General: Skin is warm and dry.     Coloration: Skin is pale.     Assessment/Plan: 1.  Acute kidney injury on chronic kidney disease stage III: He appears to have been having gradual renal recovery following initial renal insult from cardiac arrest/contrast exposure however, somewhere between 2/2 and 2/5, he appears to have had an additional renal insult.  Unclear if this is hemodynamically mediated by his fluctuating blood pressure with consequent ATN and I agree with de-escalating antihypertensive dosing to allow improve renal perfusion in the setting of ongoing diuretic use.  I have requested for renal ultrasound to evaluate any obstruction and I will send off for urine analysis and urine electrolytes to further understand the mechanism of his renal injury. Avoid nephrotoxic medications including NSAIDs and iodinated intravenous contrast exposure unless the latter is absolutely indicated.  Preferred narcotic agents for pain control are hydromorphone, fentanyl, and methadone. Morphine should not be used. Avoid Baclofen and avoid oral sodium phosphate and magnesium citrate based laxatives / bowel preps. Continue strict Input and Output monitoring. Will monitor the patient closely with you and intervene or adjust therapy as indicated by changes in clinical status/labs. 2.  Cardiac arrest with inferolateral STEMI: Status post emergent coronary angiography on 1/23 with drug-eluting stent placement to dominant circumflex vessel and reestablishment of flow without any postprocedural complications noted.  Ongoing risk factor modification at this time with blood pressure control/lipid control and ARB on hold. 3.  Anoxic encephalopathy: With evidence of anoxic brain injury: Cardiac arrest-appears to be improving slowly.  Continue mobilizing and  nutrition per primary service. 4.  Acute exacerbation of congestive heart failure: This was suspected primarily due to his increasing weight since admission without any evidence of florid volume overload.  Would be cautious in the setting of intravascular volume contraction (. 5.  Anemia: With downtrend of hemoglobin and hematocrit since admission, no overt blood loss and will send off  for iron studies. 6.  Leukocytosis: With prolonged hospitalization and anoxic encephalopathy, raises concern for opportunistic versus hospital-acquired infection.  He is afebrile but would maintain low threshold for pancultures and empiric antibiotic if develops fever.  Jaliya Siegmann K. 10/18/2020, 2:42 PM

## 2020-10-18 NOTE — Progress Notes (Signed)
Physical Therapy Treatment Patient Details Name: Timothy Moran MRN: [id number] DOB: [birth year] Today's Date: 10/18/2020    History of Present Illness Pt is 75 year old male with a history of LBBB, mildly reduced LVEF, 4.2cm TAA, multiple symptomatic meningiomas (possible NF2 suspected), CKD stage III, two prior pulmonary embolisms (now off Eliquis), HTN, and pulmonary nodules, who presented as a resuscitated VF arrest at home and was found to have inferior ST elevation on ECG. He began having chest pain while shoveling snow this afternoon. He collapsed and lost consciousness with no obvious injuries in the fall. No bystander CPR performed. He was found with agonal breathing and an AED-shockable rhythm. He received a total of 4 defibrillations by fire and EMS.  Patient is more alert, but remains confused with decreased awareness and safety.  Pt s/p cardiac cath with stent on 1/23 and was intubated until 1/25.    PT Comments    Pt continues to make progress towards his goals through increasing his independence and safety with mobility as he only required +1 physical assistance this date, but would benefit from +2 for safety as pt is still impulsive with poor safety awareness. Pt is at risk for falls, demonstrating poor RW management, especially with turns and in tight spaces, often lifting it or reaching impulsively off BOS. Pt displays endurance deficits as his trunk sway and LOB increases with fatigue. He can only ambulate short household distances at this time, and anticipate he would need extensive assistance to navigate stairs safely, limiting his ability to enter/exit his home. Will continue to follow acutely. Current recommendations remain appropriate.   Follow Up Recommendations  CIR     Equipment Recommendations  Rolling walker with 5" wheels;Wheelchair (measurements PT);Wheelchair cushion (measurements PT)    Recommendations for Other Services       Precautions / Restrictions  Precautions Precautions: Fall Precaution Comments: impulsive Restrictions Weight Bearing Restrictions: No    Mobility  Bed Mobility Overal bed mobility: Needs Assistance Bed Mobility: Supine to Sit;Sit to Supine     Supine to sit: Min guard;HOB elevated Sit to supine: Min assist   General bed mobility comments: Extra time and repeated cues to initiate bed mob, needing cues to sequence leg and trunk movement. Min guard to come to sit EOB with HOB elevated and with use of rails. Return to supine with minA at legs.  Transfers Overall transfer level: Needs assistance Equipment used: Rolling walker (2 wheeled) Transfers: Sit to/from Stand Sit to Stand: Min assist Stand pivot transfers: Min assist       General transfer comment: Sit to stand 1x from EOB and 1x from low toilet, cuing pt to push up from current sitting surface or pull up on grab bars rather than RW, poor carryover. MinA and extra time to power up to stand. VCs and tactile cues for proper feet placement.  Ambulation/Gait Ambulation/Gait assistance: Mod assist Gait Distance (Feet): 90 Feet (x2 bouts of ~15 ft > ~90 ft) Assistive device: Rolling walker (2 wheeled) Gait Pattern/deviations: Step-through pattern;Decreased stride length;Trunk flexed;Drifts right/left;Wide base of support Gait velocity: reduced Gait velocity interpretation: <1.31 ft/sec, indicative of household ambulator General Gait Details: Pt impulsive reaching off BOS on occasion and lifting RW needing repeated reminders to keep hands on RW and RW on floor. Intermittent LOB posteriorly or laterally with min-modA to recover. ModA to manage RW with turns and posterior steps. Difficulty managing in tight spaces.   Stairs  Wheelchair Mobility    Modified Rankin (Stroke Patients Only)       Balance Overall balance assessment: Needs assistance Sitting-balance support: Bilateral upper extremity supported;Feet supported Sitting  balance-Leahy Scale: Poor Sitting balance - Comments: Prefers UE support at EOB, supervision for safety.   Standing balance support: Bilateral upper extremity supported;Single extremity supported Standing balance-Leahy Scale: Poor Standing balance comment: Reliant on UEs on RW and external assist. Able to reach 1 UE off BOS but trunk sway noted.                            Cognition Arousal/Alertness: Awake/alert Behavior During Therapy: Impulsive Overall Cognitive Status: Impaired/Different from baseline Area of Impairment: Attention;Following commands;Safety/judgement;Awareness;Problem solving;Memory                   Current Attention Level: Sustained Memory: Decreased short-term memory Following Commands: Follows one step commands consistently;Follows one step commands with increased time;Follows multi-step commands inconsistently Safety/Judgement: Decreased awareness of deficits;Decreased awareness of safety Awareness: Emergent Problem Solving: Requires verbal cues;Difficulty sequencing General Comments: Pt impulsive and needing repeated cues throughout to maintain safety.      Exercises      General Comments General comments (skin integrity, edema, etc.): SpO2 >/= 92% throughout on 3L/min O2 via [place]; HR 80-90s with mobility; systolic BP dropped a little with change in positions but not >/= 20      Pertinent Vitals/Pain Pain Assessment: Faces Faces Pain Scale: Hurts a little bit Pain Location: Soreness in chest from CPR Pain Descriptors / Indicators: Grimacing;Discomfort Pain Intervention(s): Limited activity within patient's tolerance;Monitored during session;Repositioned    Home Living                      Prior Function            PT Goals (current goals can now be found in the care plan section) Acute Rehab PT Goals Patient Stated Goal: to get stronger PT Goal Formulation: With patient/family Time For Goal Achievement:  10/24/20 Potential to Achieve Goals: Good Progress towards PT goals: Progressing toward goals    Frequency    Min 3X/week      PT Plan Current plan remains appropriate    Co-evaluation              AM-PAC PT "6 Clicks" Mobility   Outcome Measure  Help needed turning from your back to your side while in a flat bed without using bedrails?: A Little Help needed moving from lying on your back to sitting on the side of a flat bed without using bedrails?: A Little Help needed moving to and from a bed to a chair (including a wheelchair)?: A Little Help needed standing up from a chair using your arms (e.g., wheelchair or bedside chair)?: A Little Help needed to walk in hospital room?: A Lot Help needed climbing 3-5 steps with a railing? : Total 6 Click Score: 15    End of Session Equipment Utilized During Treatment: Gait belt;Oxygen Activity Tolerance: Patient tolerated treatment well Patient left: in bed;with call bell/phone within reach;with bed alarm set;with nursing/sitter in room;with family/visitor present (with bed in cahir like position) Nurse Communication: Mobility status PT Visit Diagnosis: Unsteadiness on feet (R26.81);Other abnormalities of gait and mobility (R26.89);Muscle weakness (generalized) (M62.81);Difficulty in walking, not elsewhere classified (R26.2)     Time: 1423-9532 PT Time Calculation (min) (ACUTE ONLY): 39 min  Charges:  $Gait Training: 23-37 mins $Therapeutic Activity:  8-22 mins                     Moishe Spice, PT, DPT Acute Rehabilitation Services  Pager: [phone] Office: [place] 10/18/2020, 2:05 PM

## 2020-10-19 DIAGNOSIS — N179 Acute kidney failure, unspecified: Secondary | ICD-10-CM | POA: Diagnosis not present

## 2020-10-19 DIAGNOSIS — I4901 Ventricular fibrillation: Secondary | ICD-10-CM | POA: Diagnosis not present

## 2020-10-19 DIAGNOSIS — D72829 Elevated white blood cell count, unspecified: Secondary | ICD-10-CM | POA: Diagnosis not present

## 2020-10-19 DIAGNOSIS — I469 Cardiac arrest, cause unspecified: Secondary | ICD-10-CM | POA: Diagnosis not present

## 2020-10-19 LAB — IRON AND TIBC
Iron: 26 ug/dL — ABNORMAL LOW (ref 45–182)
Saturation Ratios: 10 % — ABNORMAL LOW (ref 17.9–39.5)
TIBC: 249 ug/dL — ABNORMAL LOW (ref 250–450)
UIBC: 223 ug/dL

## 2020-10-19 LAB — GLUCOSE, CAPILLARY
Glucose-Capillary: 119 mg/dL — ABNORMAL HIGH (ref 70–99)
Glucose-Capillary: 130 mg/dL — ABNORMAL HIGH (ref 70–99)
Glucose-Capillary: 169 mg/dL — ABNORMAL HIGH (ref 70–99)
Glucose-Capillary: 138 mg/dL — ABNORMAL HIGH (ref 70–99)
Glucose-Capillary: 126 mg/dL — ABNORMAL HIGH (ref 70–99)

## 2020-10-19 LAB — RENAL FUNCTION PANEL
Albumin: 2.4 g/dL — ABNORMAL LOW (ref 3.5–5.0)
Anion gap: 17 — ABNORMAL HIGH (ref 5–15)
BUN: 96 mg/dL — ABNORMAL HIGH (ref 8–23)
CO2: 14 mmol/L — ABNORMAL LOW (ref 22–32)
Calcium: 8.2 mg/dL — ABNORMAL LOW (ref 8.9–10.3)
Chloride: 110 mmol/L (ref 98–111)
Creatinine, Ser: 6.24 mg/dL — ABNORMAL HIGH (ref 0.61–1.24)
GFR, Estimated: 9 mL/min — ABNORMAL LOW (ref >60)
Glucose, Bld: 125 mg/dL — ABNORMAL HIGH (ref 70–99)
Phosphorus: 7.8 mg/dL — ABNORMAL HIGH (ref 2.5–4.6)
Potassium: 4.7 mmol/L (ref 3.5–5.1)
Sodium: 141 mmol/L (ref 135–145)

## 2020-10-19 LAB — CBC WITH DIFFERENTIAL/PLATELET
Abs Immature Granulocytes: 0.13 10*3/uL — ABNORMAL HIGH (ref 0.00–0.07)
Basophils Absolute: 0.1 10*3/uL (ref 0.0–0.1)
Basophils Relative: 1 %
Eosinophils Absolute: 0.2 10*3/uL (ref 0.0–0.5)
Eosinophils Relative: 1 %
HCT: 27.5 % — ABNORMAL LOW (ref 39.0–52.0)
Hemoglobin: 9 g/dL — ABNORMAL LOW (ref 13.0–17.0)
Immature Granulocytes: 1 %
Lymphocytes Relative: 10 %
Lymphs Abs: 1.5 10*3/uL (ref 0.7–4.0)
MCH: 30.7 pg (ref 26.0–34.0)
MCHC: 32.7 g/dL (ref 30.0–36.0)
MCV: 93.9 fL (ref 80.0–100.0)
Monocytes Absolute: 0.7 10*3/uL (ref 0.1–1.0)
Monocytes Relative: 5 %
Neutro Abs: 12.3 10*3/uL — ABNORMAL HIGH (ref 1.7–7.7)
Neutrophils Relative %: 82 %
Platelets: 504 10*3/uL — ABNORMAL HIGH (ref 150–400)
RBC: 2.93 MIL/uL — ABNORMAL LOW (ref 4.22–5.81)
RDW: 14.4 % (ref 11.5–15.5)
WBC: 14.9 10*3/uL — ABNORMAL HIGH (ref 4.0–10.5)
nRBC: 0 % (ref 0.0–0.2)

## 2020-10-19 LAB — FERRITIN: Ferritin: 633 ng/mL — ABNORMAL HIGH (ref 24–336)

## 2020-10-19 MED ORDER — NEPRO/CARBSTEADY PO LIQD
237.0000 mL | Freq: Two times a day (BID) | ORAL | Status: DC
  Administered 2020-10-19 – 2020-10-21 (×3): 237 mL via ORAL

## 2020-10-19 MED ORDER — ASPIRIN 81 MG PO CHEW
81.0000 mg | CHEWABLE_TABLET | Freq: Every day | ORAL | Status: DC
  Administered 2020-10-20 – 2020-10-29 (×10): 81 mg via ORAL
  Filled 2020-10-19 (×10): qty 1

## 2020-10-19 MED ORDER — OXYCODONE HCL 5 MG PO TABS
5.0000 mg | ORAL_TABLET | Freq: Four times a day (QID) | ORAL | Status: DC | PRN
  Administered 2020-10-21: 5 mg via ORAL
  Filled 2020-10-19: qty 1

## 2020-10-19 MED ORDER — GUAIFENESIN 100 MG/5ML PO SOLN
10.0000 mL | ORAL | Status: DC
  Administered 2020-10-19 – 2020-10-27 (×17): 200 mg via ORAL
  Filled 2020-10-19: qty 5
  Filled 2020-10-19 (×5): qty 10
  Filled 2020-10-19: qty 5
  Filled 2020-10-19: qty 10
  Filled 2020-10-19: qty 5
  Filled 2020-10-19 (×2): qty 10
  Filled 2020-10-19: qty 5
  Filled 2020-10-19 (×8): qty 10
  Filled 2020-10-19: qty 5
  Filled 2020-10-19 (×2): qty 10

## 2020-10-19 MED ORDER — ISOSORBIDE DINITRATE 10 MG PO TABS
30.0000 mg | ORAL_TABLET | Freq: Two times a day (BID) | ORAL | Status: DC
  Administered 2020-10-19 – 2020-10-29 (×20): 30 mg via ORAL
  Filled 2020-10-19 (×21): qty 3

## 2020-10-19 MED ORDER — MELATONIN 3 MG PO TABS
3.0000 mg | ORAL_TABLET | Freq: Every evening | ORAL | Status: DC | PRN
  Administered 2020-10-21 – 2020-10-27 (×3): 3 mg via ORAL
  Filled 2020-10-19 (×3): qty 1

## 2020-10-19 MED ORDER — SODIUM BICARBONATE 650 MG PO TABS
1300.0000 mg | ORAL_TABLET | Freq: Two times a day (BID) | ORAL | Status: DC
  Administered 2020-10-19 – 2020-10-21 (×5): 1300 mg via ORAL
  Filled 2020-10-19 (×5): qty 2

## 2020-10-19 MED ORDER — TICAGRELOR 90 MG PO TABS
90.0000 mg | ORAL_TABLET | Freq: Two times a day (BID) | ORAL | Status: DC
  Administered 2020-10-19 – 2020-10-29 (×20): 90 mg via ORAL
  Filled 2020-10-19 (×20): qty 1

## 2020-10-19 MED ORDER — ATORVASTATIN CALCIUM 80 MG PO TABS
80.0000 mg | ORAL_TABLET | Freq: Every day | ORAL | Status: DC
  Administered 2020-10-20 – 2020-10-29 (×10): 80 mg via ORAL
  Filled 2020-10-19 (×10): qty 1

## 2020-10-19 MED ORDER — ACETAMINOPHEN 325 MG PO TABS
650.0000 mg | ORAL_TABLET | Freq: Four times a day (QID) | ORAL | Status: DC | PRN
  Administered 2020-10-19 – 2020-10-26 (×4): 650 mg via ORAL
  Filled 2020-10-19 (×6): qty 2

## 2020-10-19 MED ORDER — SENNOSIDES-DOCUSATE SODIUM 8.6-50 MG PO TABS
1.0000 | ORAL_TABLET | Freq: Every day | ORAL | Status: DC
  Administered 2020-10-20 – 2020-10-27 (×8): 1 via ORAL
  Filled 2020-10-19 (×8): qty 1

## 2020-10-19 MED ORDER — SENNOSIDES-DOCUSATE SODIUM 8.6-50 MG PO TABS: 1.0000 | ORAL_TABLET | Freq: Every day | ORAL | Status: DC

## 2020-10-19 NOTE — Progress Notes (Addendum)
[facility] NEPHROLOGY PROGRESS NOTE  Assessment/ Plan: Pt is a 75 y.o. yo male with DM, HTN, left bundle branch block, CHF with reduced EF, meningioma, CKD stage III with baseline creatinine level 1.4-1.5 admitted with Cardiac arrest with inferior STEMI, PCI to LAD and complicated by pneumonia and AKI.  #Acute kidney injury on CKD stage III: Due to cardiorenal syndrome. He had AKI after admission due to cardiac arrest/contrast which had some recovery however developed AKI again probably due to cardiorenal syndrome/reduced renal perfusion.  Urine output is marginal on IV Lasix.  The BUN and creatinine level trending up today.  Agree with holding diuretics as his shortness of breath could be due to atelectasis, also has worsening leukocytosis.  I have discussed with the patient and his wife today that if kidney function continue to worsen by tomorrow then he will need dialysis.  They agreed with the plan. No urgent need for HD today.  #Cardiac arrest with inferolateral STEMI: Status post emergent coronary angiography on 1/23 with drug-eluting stent placement to dominant circumflex vessel and reestablishment of flow without any postprocedural complications noted.  Ongoing risk factor modification at this time with blood pressure control/lipid control and ARB on hold.  # Acute encephalopathy: Mental status seems to be improving.  # Acute exacerbation of congestive heart failure: No lower extremity edema however has pulmonary edema in x-ray.  Most likely requires dialysis in 24 to 48 hours.  #Anemia: Check iron studies.  Monitor hemoglobin.  #Metabolic acidosis: Start sodium bicarbonate.  # Leukocytosis: Chest x-ray has some atelectasis.  Leukocytosis worsening.  Defer to primary team.  Subjective: Seen and examined at bedside.  Reports shortness of breath but denies chest pain, nausea vomiting.  Urine output 560 cc.  His wife at bedside. Objective Vital signs in last 24  hours: Vitals:   10/18/20 2300 10/19/20 0317 10/19/20 0500 10/19/20 0756  BP: 131/74 120/74  127/73  Pulse: 79 82  75  Resp: 20 20  19   Temp: 98.2 F (36.8 C) 98.1 F (36.7 C)  97.8 F (36.6 C)  TempSrc: Oral Oral  Oral  SpO2: 96% 96%  96%  Weight:   100.3 kg   Height:       Weight change: -1.8 kg  Intake/Output Summary (Last 24 hours) at 10/19/2020 1010 Last data filed at 10/19/2020 0954 Gross per 24 hour  Intake 203 ml  Output 450 ml  Net -247 ml       Labs: Basic Metabolic Panel: Recent Labs  Lab 10/12/20 1151 10/13/20 0553 10/17/20 1420 10/18/20 0720 10/19/20 0048  NA 153*   < > 144 142 141  K 4.1   < > 4.7 4.7 4.7  CL 123*   < > 116* 113* 110  CO2 19*   < > 14* 13* 14*  GLUCOSE 122*   < > 158* 157* 125*  BUN 59*   < > 67* 81* 96*  CREATININE 2.39*   < > 3.89* 5.02* 6.24*  CALCIUM 8.2*   < > 8.4* 8.3* 8.2*  PHOS 3.5  --   --   --  7.8*   < > = values in this interval not displayed.   Liver Function Tests: Recent Labs  Lab 10/19/20 0048  ALBUMIN 2.4*   No results for input(s): LIPASE, AMYLASE in the last 168 hours. No results for input(s): AMMONIA in the last 168 hours. CBC: Recent Labs  Lab 10/12/20 1356 10/13/20 0553 10/18/20 0720  WBC 16.1* 15.5* 21.2*  HGB 9.8* 9.8* 9.5*  HCT 31.6* 30.4* 29.4*  MCV 97.5 94.4 94.8  PLT 243 279 443*   Cardiac Enzymes: No results for input(s): CKTOTAL, CKMB, CKMBINDEX, TROPONINI in the last 168 hours. CBG: Recent Labs  Lab 10/18/20 1619 10/18/20 2020 10/18/20 2331 10/19/20 0338 10/19/20 0827  GLUCAP 131* 111* 121* 119* 130*    Iron Studies: No results for input(s): IRON, TIBC, TRANSFERRIN, FERRITIN in the last 72 hours. Studies/Results: DG Chest 1 View  Result Date: 10/18/2020 CLINICAL DATA:  CHF. EXAM: CHEST  1 VIEW COMPARISON:  10/15/2020; 10/08/2018; 1/23/2 FINDINGS: Grossly unchanged enlarged cardiac silhouette and mediastinal contours with prominence of the pulmonary vasculature. Pulmonary  vasculature remains indistinct with cephalization of flow. Grossly unchanged perihilar and bilateral medial basilar heterogeneous opacities. No new focal airspace opacities. No definite pleural effusion or pneumothorax. No acute osseous abnormalities. Degenerative change of the right glenohumeral and acromioclavicular joints is suspected though incompletely evaluated. IMPRESSION: Similar findings of cardiomegaly, pulmonary edema and perihilar/bibasilar atelectasis. Electronically Signed   By: Sandi Mariscal M.D.   On: 10/18/2020 12:54   US RENAL  Result Date: 10/18/2020 CLINICAL DATA:  Acute kidney injury. EXAM: RENAL / URINARY TRACT ULTRASOUND COMPLETE COMPARISON:  May 03, 2018. FINDINGS: Right Kidney: Renal measurements: 11.4 x 6.6 x 5.4 cm = volume: 210 mL. Echogenicity within normal limits. No mass or hydronephrosis visualized. Left Kidney: Renal measurements: 12.7 x 6.4 x 6.4 cm = volume: 270 mL. Echogenicity within normal limits. No mass or hydronephrosis visualized. Bladder: Appears normal for degree of bladder distention. Other: None. IMPRESSION: Normal renal ultrasound. Electronically Signed   By: Marijo Conception M.D.   On: 10/18/2020 16:26    Medications: Infusions: . sodium chloride Stopped (10/14/20 1029)    Scheduled Medications: . aspirin  81 mg Per Tube Daily  . atorvastatin  80 mg Per Tube Daily  . bisacodyl  10 mg Rectal Q0600  . chlorhexidine  15 mL Mouth Rinse BID  . Chlorhexidine Gluconate Cloth  6 each Topical Daily  . enoxaparin (LOVENOX) injection  30 mg Subcutaneous Q24H  . feeding supplement  237 mL Oral BID BM  . furosemide  40 mg Intravenous BID  . guaiFENesin  10 mL Per Tube Q4H  . hydrALAZINE  25 mg Oral Q8H  . insulin aspart  0-15 Units Subcutaneous TID WC  . insulin aspart  0-5 Units Subcutaneous QHS  . isosorbide dinitrate  30 mg Per NG tube BID  . mouth rinse  15 mL Mouth Rinse q12n4p  . metoprolol succinate  25 mg Oral Daily  . multivitamin with minerals   1 tablet Oral Daily  . senna-docusate  1 tablet Per Tube BID  . sodium chloride flush  3 mL Intravenous Q12H  . sorbitol  30 mL Per Tube Q0600  . ticagrelor  90 mg Per NG tube BID    have reviewed scheduled and prn medications.  Physical Exam: General:NAD, comfortable Heart:RRR, s1s2 nl, no rubs Lungs: Bibasal crackles, no increased work of breathing Abdomen:soft, Non-tender, non-distended Extremities:No edema Neurology: Alert awake and oriented  Parrish Daddario [employer] 10/19/2020,10:10 AM  LOS: 15 days

## 2020-10-19 NOTE — Progress Notes (Addendum)
Nutrition Follow-up  DOCUMENTATION CODES:   Not applicable  INTERVENTION:    Add double protein at meal times   Nepro Shake po BID, each supplement provides 425 kcal and 19 grams protein  Add Renal MVI if HD is indicated.   NUTRITION DIAGNOSIS:   Inadequate oral intake related to inability to eat as evidenced by NPO status.  Diet advanced   GOAL:   Patient will meet greater than or equal to 90% of their needs  Progressing   MONITOR:   Diet advancement,Labs,Weight trends,I & O's  REASON FOR ASSESSMENT:   Ventilator,Consult Enteral/tube feeding initiation and management  ASSESSMENT:   75 year old male who presented to the ED on 1/23 after cardiac arrest. PMH of LBBB, multiple symptomatic meningiomas, CKD stage III, 2 prior pulmonary embolisms, HTN, pulmonary nodules. Pt found to have post-cardiac arrest STEMI s/p urgent PCI of distal LCx. TTM 36 degrees initiated.  1/23 - cardiac cath, stent, V.fib arrest 1/25 - extubated 1/26 - SLP recommending NPO, transferred out of ICU, later requiring BiPAP 1/28 - gastric Cortrak placed 2/03 - diet advanced DYS 3/thins 2/04 - Cortrak pulled per provider    Renal function declining. If no improvement in Cr/BUN tomorrow plan iHD.   Intake progressing since diet advancement. Last three meal completions charted as 60%, 60%, and 80%. Patient does not like Ensure shakes. Will try Nepro and add double protein to meals.   Admission weight: 103 kg  Current weight: 100.3 kg   UOP: 560 ml x 24 hrs   Medications: dulcolax, SS novolog, senokot  Labs: Phosphorus 7.8 (H) Cr 6.24-trending up CBG 121-169  Diet Order:   Diet Order            DIET DYS 3 Room service appropriate? Yes with Assist; Fluid consistency: Thin  Diet effective now                 EDUCATION NEEDS:   No education needs have been identified at this time  Skin:  Skin Assessment: Reviewed RN Assessment  Last BM:  2/7  Height:   Ht Readings from Last 1  Encounters:  10/06/20 6\' 1"  (1.854 m)    Weight:   Wt Readings from Last 1 Encounters:  10/19/20 100.3 kg    BMI:  Body mass index is 29.17 kg/m.  Estimated Nutritional Needs:   Kcal:  2100-2300  Protein:  115-130 grams  Fluid:  2.0 L/day  Mariana Single RD, LDN Clinical Nutrition Pager listed in [place]

## 2020-10-19 NOTE — Progress Notes (Addendum)
Progress Note  Patient Name: Timothy Moran Date of Encounter: 10/19/2020  [facility] Cardiologist: Jenne Campus, MD   Subjective   Sitting in chair at sink getting cleaned up. Notes SOB with minimal activity. Still coughing though unchanged over the past several days. He also notes tremor in bilateral arms and generalized weakness. Tremors were not present prior to his MI. Chest discomfort is limited to pain with touching or coughing. No palpitations.   Inpatient Medications    Scheduled Meds: . aspirin  81 mg Per Tube Daily  . atorvastatin  80 mg Per Tube Daily  . bisacodyl  10 mg Rectal Q0600  . chlorhexidine  15 mL Mouth Rinse BID  . Chlorhexidine Gluconate Cloth  6 each Topical Daily  . enoxaparin (LOVENOX) injection  30 mg Subcutaneous Q24H  . feeding supplement  237 mL Oral BID BM  . furosemide  40 mg Intravenous BID  . guaiFENesin  10 mL Per Tube Q4H  . hydrALAZINE  25 mg Oral Q8H  . insulin aspart  0-15 Units Subcutaneous TID WC  . insulin aspart  0-5 Units Subcutaneous QHS  . isosorbide dinitrate  30 mg Per NG tube BID  . mouth rinse  15 mL Mouth Rinse q12n4p  . metoprolol succinate  25 mg Oral Daily  . multivitamin with minerals  1 tablet Oral Daily  . senna-docusate  1 tablet Per Tube BID  . sodium chloride flush  3 mL Intravenous Q12H  . sorbitol  30 mL Per Tube Q0600  . ticagrelor  90 mg Per NG tube BID   Continuous Infusions: . sodium chloride Stopped (10/14/20 1029)   PRN Meds: sodium chloride, acetaminophen (TYLENOL) oral liquid 160 mg/5 mL, albuterol, diphenhydrAMINE, haloperidol lactate, LORazepam, melatonin, morphine injection, oxyCODONE, simethicone, sodium chloride flush, white petrolatum   Vital Signs    Vitals:   10/18/20 2300 10/19/20 0317 10/19/20 0500 10/19/20 0756  BP: 131/74 120/74  127/73  Pulse: 79 82  75  Resp: 20 20  19   Temp: 98.2 F (36.8 C) 98.1 F (36.7 C)  97.8 F (36.6 C)  TempSrc: Oral Oral  Oral  SpO2: 96% 96%   96%  Weight:   100.3 kg   Height:        Intake/Output Summary (Last 24 hours) at 10/19/2020 0956 Last data filed at 10/19/2020 0954 Gross per 24 hour  Intake 203 ml  Output 450 ml  Net -247 ml   Last 3 Weights 10/19/2020 10/18/2020 10/17/2020  Weight (lbs) 221 lb 1.9 oz 225 lb 1.4 oz 220 lb  Weight (kg) 100.3 kg 102.1 kg 99.791 kg      Telemetry    NSR - Personally Reviewed  ECG    No new tracings - Personally Reviewed  Physical Exam   GEN: Sitting in chair at sink, appearing fatigued, though in no acute distress.   Neck: No JVD Cardiac: RRR, no murmurs, rubs, or gallops.  Respiratory: Mildly increased work of breathing with coarse breath sounds at lung bases without overt wheezing, rhonchi, or rales.  GI: Soft, nontender, non-distended  MS: No edema; No deformity. Neuro:  Nonfocal  Psych: Normal affect   Labs    High Sensitivity Troponin:   Recent Labs  Lab 10/04/20 1808 10/04/20 2324 10/05/20 0500  TROPONINIHS 75* >27,000* >27,000*      Chemistry Recent Labs  Lab 10/17/20 1420 10/18/20 0720 10/19/20 0048  NA 144 142 141  K 4.7 4.7 4.7  CL 116* 113* 110  CO2  14* 13* 14*  GLUCOSE 158* 157* 125*  BUN 67* 81* 96*  CREATININE 3.89* 5.02* 6.24*  CALCIUM 8.4* 8.3* 8.2*  ALBUMIN  --   --  2.4*  GFRNONAA 15* 11* 9*  ANIONGAP 14 16* 17*     Hematology Recent Labs  Lab 10/12/20 1356 10/13/20 0553 10/18/20 0720  WBC 16.1* 15.5* 21.2*  RBC 3.24* 3.22* 3.10*  HGB 9.8* 9.8* 9.5*  HCT 31.6* 30.4* 29.4*  MCV 97.5 94.4 94.8  MCH 30.2 30.4 30.6  MCHC 31.0 32.2 32.3  RDW 14.8 15.0 14.5  PLT 243 279 443*    BNPNo results for input(s): BNP, PROBNP in the last 168 hours.   DDimer No results for input(s): DDIMER in the last 168 hours.   Radiology    DG Chest 1 View  Result Date: 10/18/2020 CLINICAL DATA:  CHF. EXAM: CHEST  1 VIEW COMPARISON:  10/15/2020; 10/08/2018; 1/23/2 FINDINGS: Grossly unchanged enlarged cardiac silhouette and mediastinal contours with  prominence of the pulmonary vasculature. Pulmonary vasculature remains indistinct with cephalization of flow. Grossly unchanged perihilar and bilateral medial basilar heterogeneous opacities. No new focal airspace opacities. No definite pleural effusion or pneumothorax. No acute osseous abnormalities. Degenerative change of the right glenohumeral and acromioclavicular joints is suspected though incompletely evaluated. IMPRESSION: Similar findings of cardiomegaly, pulmonary edema and perihilar/bibasilar atelectasis. Electronically Signed   By: Sandi Mariscal M.D.   On: 10/18/2020 12:54   US RENAL  Result Date: 10/18/2020 CLINICAL DATA:  Acute kidney injury. EXAM: RENAL / URINARY TRACT ULTRASOUND COMPLETE COMPARISON:  May 03, 2018. FINDINGS: Right Kidney: Renal measurements: 11.4 x 6.6 x 5.4 cm = volume: 210 mL. Echogenicity within normal limits. No mass or hydronephrosis visualized. Left Kidney: Renal measurements: 12.7 x 6.4 x 6.4 cm = volume: 270 mL. Echogenicity within normal limits. No mass or hydronephrosis visualized. Bladder: Appears normal for degree of bladder distention. Other: None. IMPRESSION: Normal renal ultrasound. Electronically Signed   By: Marijo Conception M.D.   On: 10/18/2020 16:26    Cardiac Studies   Cath 10/04/20 Conclusions: 1. Significant two-vessel coronary artery disease including thrombotic occlusion of the distal LCx, which is the culprit for the patient'sinferior STEMIas well as sequential 50% and 80% proximal/mid LAD lesions. 2. Mildly elevated left ventricular filling pressure (LVEDP 20-25 mmHg). 3. SuccessfulPCI to distal LCx using Resolute Onyx 2.5 x 30 mm drug-eluting stent complicated by no reflow treated with intracoronary adenosine and nitroglycerin, as well as aggressive antiplatelet therapy. Final angiogram shows 0% residual stenosis with TIMI-2 flow.  Recommendations: 1. Admit to 2H-ICU for post STEMI/cardiac arrestmonitoring. Consider targeted temperature  management; critical care medicine has been consulted to assist with targeted temperature and ventilator management. 2. Continue tirofiban infusion for 6 hours. 3. Loaded with ticagrelor when patient arrives in ICU; patient should complete 12 months of dual antiplatelet therapy with aspirin and ticagrelor. 4. Obtain echocardiogram. 5. Aggressive secondary prevention of coronary artery disease including high intensity statin therapy. 6. Continue IV amiodarone overnight. 7. Consider staged PCI to LAD.  Echocardiogram 10/05/20 1. Since the study on 06/19/2020 LVEF has decreased, now severely impaired  at 20-25% and diffuse hypokinesis.  2. Left ventricular ejection fraction, by estimation, is 20 to 25%. The  left ventricle has severely decreased function. The left ventricle  demonstrates global hypokinesis. There is mild concentric left ventricular  hypertrophy. Left ventricular diastolic  parameters are consistent with Grade I diastolic dysfunction (impaired  relaxation).  3. Right ventricular systolic function is normal. The right  ventricular  size is normal.  4. The mitral valve is normal in structure. Mild mitral valve  regurgitation. No evidence of mitral stenosis.  5. The aortic valve is normal in structure. Aortic valve regurgitation is  trivial. No aortic stenosis is present.  6. The inferior vena cava is normal in size with greater than 50%  respiratory variability, suggesting right atrial pressure of 3 mmHg.    Patient Profile     75 y.o. male with a PMH of LBBB, mildly reduced LVEF, thoracic aortic aneurysm (4.2cm), HTN, multiple symptomatic meningiomas (possible NF2 suspected), 2 prior PE's no longer on anticoagulation, and CKD stage 3, who presented with out of hospital VF arrest with successful resuscitation with post resuscitation EKG revealing inferior STEMI.    Assessment & Plan    1. Inferolateral STEMI precipitated by VF arrest: patient had out of hospital VF  arrest with successful ROSC after 16 minutes of ACLS including multiple defibrillations. Post ROSC EKG with interolateral STE. He was taken emergently to the cath lab 10/04/20 where he was found to have significatn 2-vessel CAD with PCI/DES to LCx, started on aspirin and brilinta, with plans to consider staged PCI to LAD in the future. HsTrop peaked at >27000. He was maintained on IV amiodarone initially, though this was discontinued after revascularization. No further episodes of VF/VT noted on telemetry. Still with some ongoing chest discomfort which was felt to be MSK given resuscitative efforts - Continue aspirin and brilinta - Continue statin - Continue BBlocker - Continue isosorbide dinitrate.  - Plans for staged intervention will be delayed pending recovery of renal function and symptomology   2. Acute combined CHF: Echo this admission showed EF 20-25%, with diffuse hypokinesis, mild concentric LVH, G1DD, and mild MR. He was started on IV lasix 40mg  BID yesterday given increased weight and SOB. UOP is inaccurate with several unmeasured UOP occurrences. Weight is down to 221lbs today from 225lbs yesterday with peak weight 240lbs this admission. GDMT has been limited by AoCKD this admission - Cr 1.8 on admission, then up to 2.97 initially with some improvement, however now 3.89>5.02>6.24 over the past 3 days.  - Will hold lasix today given continued uptrending of Cr - May need HD in the next 24-48 hours for volume management given uptrending Cr and ongoing pulmonary edema on CXR yesterday. - Continue metoprolol succinate, hydralazine, and isordil  3. AoCKD stage 3: Cr 1.8 on admission, then up to 2.97 with some initial improvement, however over the past 3 days has risen significantly 3.89>5.02>6.24. Nephrology consulted yesterday. Urine studies suggestive of intrinsic etiology. Renal ultrasound was unremarkable. Started on sodium bicarb BID today given mild metabolic acidosis with anion gap 17 on  labs today. Holding diuresis. May need to initiate HD tomorrow if Cr continues to worsen - this was discussed with the patient/wife today.  - Appreciate nephrology's assistance - Will continue to monitor Cr closely and avoid nephrotoxic agents  4. Leukocytosis: WBC uptrended to 21.2 yesterday. Await repeat today. He has been coughing, though no significant change in the past several days. No clear infection on CXR yesterday and he remains afebrile. No complaints of dysuria, hematuria, chills/sweats.  - Low threshold to pan culture if fever, change in symptoms, or significant rise in WBC on repeat labs today - Encouraged incentive spirometer use - order placed.   5. Anemia: Hgb was 13 on admission, down to 9.2 in the setting of an oozing central line. Hgb increased to 11.2, then back down to 9.5, though  generally stable over the past few days. No evidence of bleeding on DAPT. - Will follow-up iron studies ordered for this morning.   6. DM type 2: Hgb A1C 7.0 this admission. This is a new diagnosis. On ISS.  - Will place consult to DM coordinator as previously recommended.   7. History of PE's: most recently noted to have PE 08/03/20 and recommended for 3 month course of eliquis, however patient self discontinued this at home. Anticoagulation not restarted this admission  8. Encephalopathy: felt to be 2/2 mild anoxic brain injury. Mental status continues to improve. He does have a mild tremor on bilateral upper extremities that was not present prior to his MI. Query whether this is related.  - Will discuss any additional work-up with Dr. Percival Spanish  9. Constipation: resolved, now having multiple BMs per day.  - Will de-escalate bowel regimen  For questions or updates, please contact [place] Please consult [email or web address] for contact info under     Signed, Abigail Butts, PA-C  10/19/2020, 9:56 AM    History and all data above reviewed.  He is somnolent but completely arouseble .  No  pain.  He has ambulated to the bathroom.  Patient examined.  I agree with the findings as above.    The patient exam reveals COR:RRR  ,  Lungs: Clear  ,  Abd: Positive bowel sounds, no rebound no guarding, Ext No edema  .  All available labs, radiology testing, previous records reviewed. Agree with documented assessment and plan.   AKI:  Dialysis will be started in the next one to two days.  ACUTE SYSTOLIC HF:  Plan is for volume management per dialysis.  I think we would be able to titrate hydral/nitrates/beta blocker but I will wait to see how his BP is with dialysis.  I will avoid ARB/ARNI with the hope that he will eventually have renal recover.   LEUKOCYTOSIS:  He has no fevers.  Urine and CXR without localizing infection.  Exam is unremarkable.  He has no pain or localizing symptoms. I am not clear on the etiology and will follow.    Jeneen Rinks Chaz Mcglasson  11:43 AM  10/19/2020

## 2020-10-19 NOTE — Progress Notes (Signed)
Mobility Specialist: Progress Note   10/19/20 1227  Mobility  Activity Ambulated in room;Ambulated in hall  Level of Assistance Minimal assist, patient does 75% or more  Assistive Device Front wheel walker  Distance Ambulated (ft) 132 ft (84' in room then 48' in hallway)  Mobility Response Tolerated fair  Mobility performed by Mobility specialist  $Mobility charge 1 Mobility   Pre-Mobility: 72 HR, 116/70 BP, 97% SpO2 During Mobility: 82 HR, 95% SpO2 Post-Mobility: 81 HR, 103/72 BP, 96% SpO2  Pt ambulated on 2 L/min . Pt minA to stand and contact guard when walking. Pt to BR and had a BM. Pt walked to door and back in room 84' before needing a seated rest break. Pt took brief standing breaks when turning around as well. Pt experienced LOB when standing up in the BR and when turning in the hallway. Pt required frequent verbal cues for hand placement and RW proximity.   Same Timothy Moran [facility] Steele Stracener Mobility Specialist Mobility Specialist Phone: [phone]

## 2020-10-20 DIAGNOSIS — I4901 Ventricular fibrillation: Secondary | ICD-10-CM | POA: Diagnosis not present

## 2020-10-20 DIAGNOSIS — I469 Cardiac arrest, cause unspecified: Secondary | ICD-10-CM | POA: Diagnosis not present

## 2020-10-20 LAB — RENAL FUNCTION PANEL
Albumin: 2.4 g/dL — ABNORMAL LOW (ref 3.5–5.0)
Anion gap: 19 — ABNORMAL HIGH (ref 5–15)
BUN: 121 mg/dL — ABNORMAL HIGH (ref 8–23)
CO2: 13 mmol/L — ABNORMAL LOW (ref 22–32)
Calcium: 7.9 mg/dL — ABNORMAL LOW (ref 8.9–10.3)
Chloride: 108 mmol/L (ref 98–111)
Creatinine, Ser: 8.02 mg/dL — ABNORMAL HIGH (ref 0.61–1.24)
GFR, Estimated: 6 mL/min — ABNORMAL LOW (ref >60)
Glucose, Bld: 110 mg/dL — ABNORMAL HIGH (ref 70–99)
Phosphorus: 10.3 mg/dL — ABNORMAL HIGH (ref 2.5–4.6)
Potassium: 4.6 mmol/L (ref 3.5–5.1)
Sodium: 140 mmol/L (ref 135–145)

## 2020-10-20 LAB — GLUCOSE, CAPILLARY
Glucose-Capillary: 109 mg/dL — ABNORMAL HIGH (ref 70–99)
Glucose-Capillary: 102 mg/dL — ABNORMAL HIGH (ref 70–99)
Glucose-Capillary: 189 mg/dL — ABNORMAL HIGH (ref 70–99)
Glucose-Capillary: 99 mg/dL (ref 70–99)
Glucose-Capillary: 153 mg/dL — ABNORMAL HIGH (ref 70–99)

## 2020-10-20 LAB — CBC WITH DIFFERENTIAL/PLATELET
Abs Immature Granulocytes: 0.09 10*3/uL — ABNORMAL HIGH (ref 0.00–0.07)
Basophils Absolute: 0 10*3/uL (ref 0.0–0.1)
Basophils Relative: 0 %
Eosinophils Absolute: 0.3 10*3/uL (ref 0.0–0.5)
Eosinophils Relative: 3 %
HCT: 25.7 % — ABNORMAL LOW (ref 39.0–52.0)
Hemoglobin: 8.6 g/dL — ABNORMAL LOW (ref 13.0–17.0)
Immature Granulocytes: 1 %
Lymphocytes Relative: 12 %
Lymphs Abs: 1.4 10*3/uL (ref 0.7–4.0)
MCH: 31.2 pg (ref 26.0–34.0)
MCHC: 33.5 g/dL (ref 30.0–36.0)
MCV: 93.1 fL (ref 80.0–100.0)
Monocytes Absolute: 0.8 10*3/uL (ref 0.1–1.0)
Monocytes Relative: 7 %
Neutro Abs: 8.7 10*3/uL — ABNORMAL HIGH (ref 1.7–7.7)
Neutrophils Relative %: 77 %
Platelets: 450 10*3/uL — ABNORMAL HIGH (ref 150–400)
RBC: 2.76 MIL/uL — ABNORMAL LOW (ref 4.22–5.81)
RDW: 14.4 % (ref 11.5–15.5)
WBC: 11.2 10*3/uL — ABNORMAL HIGH (ref 4.0–10.5)
nRBC: 0 % (ref 0.0–0.2)

## 2020-10-20 LAB — HEPATITIS B SURFACE ANTIGEN: Hepatitis B Surface Ag: NONREACTIVE

## 2020-10-20 LAB — UREA NITROGEN, URINE: Urea Nitrogen, Ur: 601 mg/dL

## 2020-10-20 MED ORDER — DARBEPOETIN ALFA 60 MCG/0.3ML IJ SOSY: 60.0000 ug | PREFILLED_SYRINGE | INTRAMUSCULAR | Status: DC

## 2020-10-20 MED ORDER — CHLORHEXIDINE GLUCONATE CLOTH 2 % EX PADS
6.0000 | MEDICATED_PAD | Freq: Every day | CUTANEOUS | Status: DC
  Administered 2020-10-21 – 2020-10-24 (×4): 6 via TOPICAL

## 2020-10-20 MED ORDER — DIPHENHYDRAMINE HCL 25 MG PO CAPS
50.0000 mg | ORAL_CAPSULE | Freq: Every evening | ORAL | Status: DC | PRN
  Administered 2020-10-22: 50 mg via ORAL
  Filled 2020-10-20: qty 2

## 2020-10-20 MED ORDER — DOCUSATE SODIUM 100 MG PO CAPS
200.0000 mg | ORAL_CAPSULE | Freq: Every day | ORAL | Status: DC
  Administered 2020-10-21 – 2020-10-29 (×9): 200 mg via ORAL
  Filled 2020-10-20 (×9): qty 2

## 2020-10-20 MED ORDER — SODIUM CHLORIDE 0.9 % IV SOLN
250.0000 mg | Freq: Every day | INTRAVENOUS | Status: AC
  Administered 2020-10-20 – 2020-10-22 (×3): 250 mg via INTRAVENOUS
  Filled 2020-10-20 (×3): qty 20

## 2020-10-20 MED ORDER — CEFAZOLIN SODIUM-DEXTROSE 2-4 GM/100ML-% IV SOLN: 2.0000 g | INTRAVENOUS | Status: AC

## 2020-10-20 NOTE — Consult Note (Addendum)
Chief Complaint: Patient was seen in consultation today for tunneled hemodialysis catheter placement Chief Complaint  Patient presents with  . post cpr    Referring Physician(s): Preston  Supervising Physician: Mir,F  Patient Status: [facility] - In-pt  History of Present Illness: Timothy Moran is a 75 y.o. yo male with hx DM, HTN, left bundle branch block, CHF with reduced EF, meningioma, CKD stage III with baseline creatinine level 1.4-1.5 admitted with cardiac arrest with inferior STEMI, PCI to LAD 03/13/62 and complicated by pneumonia and AKI. He had AKI after admission due to cardiac arrest/contrast which had some recovery however developed AKI again probably due to cardiorenal syndrome/reduced renal perfusion.   Urine output is decent however worsening renal labs associated with uremia and acidosis. Current creat 8.02(6.24). Never been dialyzed before. On brilinta, lovenox, aspirin. Request now received for tunneled HD cath placement.   Past Medical History:  Diagnosis Date  . Abnormal echocardiogram 06/07/2019  . Arrhythmia   . Ascending aortic aneurysm (HCC) 4.2 cm based on CT from December 2020 01/29/2018   4.3 cm by echo in 2019  . Benign brain tumor ([place])   . Benign neoplasm of brain ([place]) 12/19/2017  . Chest pain 12/04/2017  . Chronic kidney disease    Stage 3  . Chronic kidney disease, stage III (moderate) ([place]) 12/19/2017  . Diabetes mellitus, type 2 ([place]) 10/06/2020   10/06/20 A1C 7%  . Dizziness 10/11/2019  . History of pulmonary embolus (PE)   . Hypertension   . Left bundle branch block 12/19/2017  . Nonsustained ventricular tachycardia ([place]) 10/11/2019  . Pain of left hip joint 05/29/2020  . Personal history of pulmonary embolism 12/19/2017  . Pulmonary hypertension due to thromboembolism ([place]) 03/09/2018   See echo 12/27/17 with nl PAS vs CTa chest 02/23/18   . Sinus bradycardia 12/22/2017  . Solitary pulmonary nodule on lung CT 03/08/2018   CT 12/04/17 1.0 x 0.8 x 0.7  cm nodular opacity in the RUL vs not seen 03/16/10 posterior segment of the right upper lobe. Marland KitchenSpirometry 03/08/2018    FEV1 3.86 (108%)  Ratio 96 s prior rx  - PET  03/13/18   Low grade c/w adenoca > rec T surgery eval      Past Surgical History:  Procedure Laterality Date  . APPENDECTOMY    . CORONARY/GRAFT ACUTE MI REVASCULARIZATION N/A 10/04/2020   Procedure: Coronary/Graft Acute MI Revascularization;  Surgeon: Nelva Bush, MD;  Location: [facility];  Service: Cardiovascular;  Laterality: N/A;  . HERNIA REPAIR    . LEFT HEART CATH AND CORONARY ANGIOGRAPHY N/A 10/04/2020   Procedure: LEFT HEART CATH AND CORONARY ANGIOGRAPHY;  Surgeon: Nelva Bush, MD;  Location: [facility];  Service: Cardiovascular;  Laterality: N/A;    Allergies: Patient has no known allergies.  Medications: Prior to Admission medications   Medication Sig Start Date End Date Taking? Authorizing Provider  B Complex-C (SUPER B COMPLEX PO) Take 1 tablet by mouth daily.   Yes [provider]  Cholecalciferol (VITAMIN D3) 5000 units TABS Take 5,000 Units by mouth daily.   Yes [provider]  finasteride (PROSCAR) 5 MG tablet Take 5 mg by mouth daily. 10/17/19  Yes [provider]  Magnesium 400 MG CAPS Take 400 mg by mouth daily.    Yes [provider]  nitroGLYCERIN (NITROSTAT) 0.4 MG SL tablet Place 1 tablet (0.4 mg total) under the tongue every 5 (five) minutes as needed for chest pain. 05/27/20 08/25/20 Yes  Tobb, Kardie, DO  Omega-3 Fatty Acids (FISH OIL) 1360 MG CAPS Take 2 capsules by mouth daily.   Yes [provider]  oxymetazoline (AFRIN) 0.05 % nasal spray Place 1 spray into both nostrils 2 (two) times daily as needed for congestion.   Yes [provider]  rosuvastatin (CRESTOR) 20 MG tablet Take 1 tablet (20 mg total) by mouth daily. Patient taking differently: Take 20 mg by mouth every evening. 08/03/20 11/01/20 Yes Park Liter, MD   tamsulosin (FLOMAX) 0.4 MG CAPS capsule Take 0.4 mg by mouth daily. 06/29/18  Yes [provider]  valsartan (DIOVAN) 160 MG tablet Take 1 tablet (160 mg total) by mouth daily. 09/07/20  Yes Park Liter, MD     Family History  Problem Relation Age of Onset  . Diabetes Mother   . Brain cancer Sister   . Melanoma Brother        Radiation Exposure  . Leukemia Brother        Agent Orange    Social History   Socioeconomic History  . Marital status: Married    Spouse name: Not on file  . Number of children: Not on file  . Years of education: Not on file  . Highest education level: Not on file  Occupational History  . Not on file  Tobacco Use  . Smoking status: Former Smoker    Packs/day: 0.50    Years: 5.00    Pack years: 2.50    Types: Cigarettes    Quit date: 09/12/1968    Years since quitting: 52.1  . Smokeless tobacco: Never Used  Vaping Use  . Vaping Use: Never used  Substance and Sexual Activity  . Alcohol use: Yes    Comment: ONE PER WEEK  . Drug use: Never  . Sexual activity: Not on file  Other Topics Concern  . Not on file  Social History Narrative  . Not on file   Social Determinants of Health   Financial Resource Strain: Not on file  Food Insecurity: Not on file  Transportation Needs: Not on file  Physical Activity: Not on file  Stress: Not on file  Social Connections: Not on file      Review of Systems denies fever, dyspnea, cough, abd pain, back pain,N/V or bleeding; does have occ HA's, some CP whenever coughs hard  Vital Signs: BP 113/66 (BP Location: Right Arm)   Pulse 75   Temp 98.2 F (36.8 C) (Oral)   Resp 19   Ht 6\' 1"  (1.854 m)   Wt 221 lb 8 oz (100.5 kg)   SpO2 93%   BMI 29.22 kg/m   Physical Exam awake/answers questions ok; chest- dim BS bases; heart- RRR; abd- soft,+BS,NT; no LE edema  Imaging: DG Chest 1 View  Result Date: 10/18/2020 CLINICAL DATA:  CHF. EXAM: CHEST  1 VIEW COMPARISON:  10/15/2020;  10/08/2018; 1/23/2 FINDINGS: Grossly unchanged enlarged cardiac silhouette and mediastinal contours with prominence of the pulmonary vasculature. Pulmonary vasculature remains indistinct with cephalization of flow. Grossly unchanged perihilar and bilateral medial basilar heterogeneous opacities. No new focal airspace opacities. No definite pleural effusion or pneumothorax. No acute osseous abnormalities. Degenerative change of the right glenohumeral and acromioclavicular joints is suspected though incompletely evaluated. IMPRESSION: Similar findings of cardiomegaly, pulmonary edema and perihilar/bibasilar atelectasis. Electronically Signed   By: Sandi Mariscal M.D.   On: 10/18/2020 12:54   DG Chest 1 View  Result Date: 10/15/2020 CLINICAL DATA:  Shortness of breath EXAM: CHEST  1 VIEW COMPARISON:  October 08, 2018 FINDINGS: The cardiomediastinal silhouette is unchanged and enlarged in contour. The enteric tube courses through the chest to the abdomen beyond the field-of-view. No pleural effusion. No pneumothorax. Decreased bibasilar heterogeneous opacities. Mild residual raises Duke lower nodular opacities. Visualized abdomen is unremarkable. Multilevel degenerative changes of the thoracic spine. IMPRESSION: Persistent but decreased bibasilar heterogeneous opacities. Electronically Signed   By: Valentino Saxon MD   On: 10/15/2020 12:45   DG Chest 1 View  Result Date: 10/04/2020 CLINICAL DATA:  Central line placement EXAM: CHEST  1 VIEW COMPARISON:  10/04/2020 FINDINGS: Single frontal view of the chest demonstrates stable endotracheal tube and enteric catheter. Left subclavian central venous catheter tip overlies superior vena cava. The cardiac silhouette is enlarged but stable. Persistent central vascular congestion without consolidation, effusion, or pneumothorax. IMPRESSION: 1. No complication after left subclavian catheter placement. 2. Stable central vascular congestion. Electronically Signed   By:  Randa Ngo M.D.   On: 10/04/2020 22:54   DG Abd 1 View  Result Date: 10/04/2020 CLINICAL DATA:  Enteric catheter placement, history of cardiac arrest EXAM: ABDOMEN - 1 VIEW COMPARISON:  None. FINDINGS: Supine frontal view of the upper abdomen was obtained, excluding the lower pelvis and right flank by collimation. Enteric catheter passes below diaphragm tip and side port projecting over the gastric fundus. External defibrillator pads overlie the lower chest. Excreted contrast within the kidneys. Paucity of bowel gas. IMPRESSION: 1. Enteric catheter overlying gastric fundus. Electronically Signed   By: Randa Ngo M.D.   On: 10/04/2020 22:15   CT Head Wo Contrast  Result Date: 10/04/2020 CLINICAL DATA:  Initial evaluation for acute mental status change, history of cardiac arrest, STEMI. EXAM: CT HEAD WITHOUT CONTRAST TECHNIQUE: Contiguous axial images were obtained from the base of the skull through the vertex without intravenous contrast. COMPARISON:  Previous MRI from 07/26/2020. FINDINGS: Brain: Cerebral volume within normal limits for age. Underlying mild chronic small vessel ischemic disease. No acute intracranial hemorrhage. No acute large vessel territory infarct. Left parafalcine calcified meningioma seen at the posterior left frontoparietal region, measuring 2.7 x 2.1 cm (series 3, image 22). Finding is relatively unchanged from previous. No significant surrounding edema or mass effect. An additional on plaque meningioma overlying the anterior left frontal convexity measures approximately 3.7 x 1.1 cm, also not significantly changed. Scattered areas of internal calcification seen within this lesion as well. No significant localized edema or mass effect. No other mass lesion. No midline shift or hydrocephalus. No extra-axial fluid collection. Vascular: No hyperdense vessel. Skull: Scalp soft tissues within normal limits.  Calvarium intact. Sinuses/Orbits: Globes and orbital soft tissues within  normal limits. Scattered mucosal thickening noted within the ethmoidal air cells. Endotracheal and enteric tubes partially visualized. No mastoid effusion. Other: None. IMPRESSION: 1. No acute intracranial abnormality. 2. Stable left parafalcine and anterior left frontal meningiomas as above. No significant localized edema or mass effect. 3. Underlying mild chronic small vessel ischemic disease. Electronically Signed   By: Jeannine Boga M.D.   On: 10/04/2020 20:07   CT SOFT TISSUE NECK WO CONTRAST  Result Date: 10/15/2020 CLINICAL DATA:  Non pulsatile pharyngeal mass. EXAM: CT NECK WITHOUT CONTRAST TECHNIQUE: Multidetector CT imaging of the neck was performed following the standard protocol without intravenous contrast. COMPARISON:  None. FINDINGS: Pharynx and larynx: Mild prominence of the pharyngeal tonsil bilaterally without mass or abscess. Glottis is closed without mass. Calcification in the base of the epiglottis. NG extends into the esophagus. Salivary  glands: No inflammation, mass, or stone. Thyroid: Negative Lymph nodes: No enlarged lymph nodes in the neck. Vascular: Limited vascular evaluation without intravenous contrast. Limited intracranial: Negative Visualized orbits: Negative Mastoids and visualized paranasal sinuses: Mild mucosal edema paranasal sinuses. Skeleton: Cervical spondylosis without acute abnormality. Lucency around left upper molars. Upper chest: Patchy bilateral airspace disease with small bilateral effusions. Other: None IMPRESSION: Mild prominence of the pharyngeal tonsils bilaterally without mass or abscess. Glottis is closed. Negative for soft tissue mass or adenopathy in neck Patchy airspace disease in the upper lobes bilaterally with small effusions. Findings suggestive of pneumonia. Correlate with COVID status. Electronically Signed   By: Franchot Gallo M.D.   On: 10/15/2020 16:10   CARDIAC CATHETERIZATION  Result Date: 10/04/2020 Conclusions: 1. Significant  two-vessel coronary artery disease including thrombotic occlusion of the distal LCx, which is the culprit for the patient's inferior STEMI as well as sequential 50% and 80% proximal/mid LAD lesions. 2. Mildly elevated left ventricular filling pressure (LVEDP 20-25 mmHg). 3. Successful PCI to distal LCx using Resolute Onyx 2.5 x 30 mm drug-eluting stent complicated by no reflow treated with intracoronary adenosine and nitroglycerin, as well as aggressive antiplatelet therapy. Final angiogram shows 0% residual stenosis with TIMI-2 flow. Recommendations: 1. Admit to 2H-ICU for post STEMI/cardiac arrest monitoring. Consider targeted temperature management; critical care medicine has been consulted to assist with targeted temperature and ventilator management. 2. Continue tirofiban infusion for 6 hours. 3. Loaded with ticagrelor when patient arrives in ICU; patient should complete 12 months of dual antiplatelet therapy with aspirin and ticagrelor. 4. Obtain echocardiogram. 5. Aggressive secondary prevention of coronary artery disease including high intensity statin therapy. 6. Continue IV amiodarone overnight. 7. Consider staged PCI to LAD. Nelva Bush, MD [facility]   US RENAL  Result Date: 10/18/2020 CLINICAL DATA:  Acute kidney injury. EXAM: RENAL / URINARY TRACT ULTRASOUND COMPLETE COMPARISON:  May 03, 2018. FINDINGS: Right Kidney: Renal measurements: 11.4 x 6.6 x 5.4 cm = volume: 210 mL. Echogenicity within normal limits. No mass or hydronephrosis visualized. Left Kidney: Renal measurements: 12.7 x 6.4 x 6.4 cm = volume: 270 mL. Echogenicity within normal limits. No mass or hydronephrosis visualized. Bladder: Appears normal for degree of bladder distention. Other: None. IMPRESSION: Normal renal ultrasound. Electronically Signed   By: Marijo Conception M.D.   On: 10/18/2020 16:26   DG Chest Port 1 View  Result Date: 10/08/2020 CLINICAL DATA:  Shortness of breath EXAM: PORTABLE CHEST 1 VIEW COMPARISON:   October 04, 2020 FINDINGS: Endotracheal tube and nasogastric tube have been removed. Central catheter tip is in the superior vena cava slightly beyond the junction with the left innominate vein. No pneumothorax. There is airspace opacity in right mid and lower lung regions as well as in the left perihilar region. Heart is enlarged, stable, with pulmonary vascularity within normal limits. No adenopathy. No bone lesions. IMPRESSION: Multifocal airspace opacity, more on the right than the left. Appearance consistent with multifocal pneumonia. Question atypical organism pneumonia. Cardiomegaly is stable. Central catheter as described without pneumothorax. Electronically Signed   By: Lowella Grip III M.D.   On: 10/08/2020 08:01   DG Chest Port 1 View  Result Date: 10/04/2020 CLINICAL DATA:  Intubated, cardiac arrest, myocardial infarction EXAM: PORTABLE CHEST 1 VIEW COMPARISON:  10/04/2020 at 6:05 p.m. FINDINGS: Single frontal view of the chest demonstrates endotracheal tube overlying tracheal air column tip at level of thoracic inlet. Enteric catheter tip and side port project over gastric fundus. Cardiac silhouette  is enlarged but stable. Central vascular congestion without airspace disease, effusion, or pneumothorax. No acute bony abnormalities. IMPRESSION: 1. Central vascular congestion without overt edema. 2. Support devices as above. Electronically Signed   By: Randa Ngo M.D.   On: 10/04/2020 22:16   DG Chest Port 1 View  Result Date: 10/04/2020 CLINICAL DATA:  75 year old male with history of cardiac arrest status post CPR. EXAM: PORTABLE CHEST 1 VIEW COMPARISON:  Chest x-ray 06/25/2020. FINDINGS: An endotracheal tube is in place with tip 5.3 cm above the carina. Nasogastric tube extending into the proximal stomach. Defibrillator pad projecting over the lower left hemithorax. Lung volumes are low. No acute consolidative airspace disease. No pleural effusions. No pneumothorax. No evidence of  pulmonary edema. Mild cardiomegaly. The patient is rotated to the right on today's exam, resulting in distortion of the mediastinal contours and reduced diagnostic sensitivity and specificity for mediastinal pathology. IMPRESSION: 1. Support apparatus, as above. 2. Mild cardiomegaly. Electronically Signed   By: Vinnie Langton M.D.   On: 10/04/2020 18:21   EEG adult  Result Date: 10/05/2020 Lora Havens, MD     10/06/2020  8:48 AM Patient Name: Michelle Vanhise MRN: [id number] Epilepsy Attending: Lora Havens Referring Physician/Provider: Dr Rollene Fare Date: 10/05/2020 Duration: 25.56 minutes Patient history: 75 year old man status post cardiac arrest.  EEG to evaluate for seizures. Level of alertness: Comatose AEDs during EEG study: Versed Technical aspects: This EEG study was done with scalp electrodes positioned according to the 10-20 International system of electrode placement. Electrical activity was acquired at a sampling rate of 500Hz  and reviewed with a high frequency filter of 70Hz  and a low frequency filter of 1Hz . EEG data were recorded continuously and digitally stored. Description: EEG showed continuous generalized 3 to 5 Hz theta- delta slowing. EEG was reactive to noxious stimulation.  Hyperventilation and photic stimulation were not performed.   ABNORMALITY -Continuous slow, generalized IMPRESSION: This study is suggestive of severe diffuse encephalopathy, nonspecific etiology.  No seizures or epileptiform discharges were seen throughout the recording. Priyanka [name]   Overnight EEG with video  Result Date: 10/06/2020 Lora Havens, MD     10/06/2020 10:24 AM Patient Name: Kolbee Bogusz MRN: [id number] Epilepsy Attending: Lora Havens Referring Physician/Provider: Dr Rollene Fare Duration: 10/05/2020 7322 to 10/06/2020 0254  Patient history: 75 year old man status post cardiac arrest.  EEG to evaluate for seizures.  Level of alertness: Comatose  AEDs during EEG study:  Versed  Technical aspects: This EEG study was done with scalp electrodes positioned according to the 10-20 International system of electrode placement. Electrical activity was acquired at a sampling rate of 500Hz  and reviewed with a high frequency filter of 70Hz  and a low frequency filter of 1Hz . EEG data were recorded continuously and digitally stored.  Description: EEG showed continuous generalized 3 to 5 Hz theta- delta slowing. EEG was reactive to noxious stimulation.  Hyperventilation and photic stimulation were not performed.    ABNORMALITY -Continuous slow, generalized  IMPRESSION: This study is suggestive of severe diffuse encephalopathy, nonspecific etiology.  No seizures or epileptiform discharges were seen throughout the recording.  Lora Havens   ECHOCARDIOGRAM COMPLETE  Result Date: 10/05/2020    ECHOCARDIOGRAM REPORT   Patient Name:   CREG GILMER Date of Exam: 10/05/2020 Medical Rec #:  [id number]          Height:       73.0 in Accession #:    [id number]  Weight:       229.9 lb Date of Birth:  1946/08/30           BSA:          2.283 m Patient Age:    55 years           BP:           121/73 mmHg Patient Gender: M                  HR:           61 bpm. Exam Location:  Inpatient Procedure: 2D Echo Indications:    cardiac arrest  History:        Patient has prior history of Echocardiogram examinations, most                 recent 06/19/2020. Chronic kidney disease, Arrythmias:LBBB and                 Ventricular Fibrillation; Signs/Symptoms:Shortness of Breath.  Sonographer:    Johny Chess Referring Phys: [place]  1. Since the study on 06/19/2020 LVEF has decreased, now severely impaired at 20-25% and diffuse hypokinesis.  2. Left ventricular ejection fraction, by estimation, is 20 to 25%. The left ventricle has severely decreased function. The left ventricle demonstrates global hypokinesis. There is mild concentric left ventricular hypertrophy. Left  ventricular diastolic  parameters are consistent with Grade I diastolic dysfunction (impaired relaxation).  3. Right ventricular systolic function is normal. The right ventricular size is normal.  4. The mitral valve is normal in structure. Mild mitral valve regurgitation. No evidence of mitral stenosis.  5. The aortic valve is normal in structure. Aortic valve regurgitation is trivial. No aortic stenosis is present.  6. The inferior vena cava is normal in size with greater than 50% respiratory variability, suggesting right atrial pressure of 3 mmHg. FINDINGS  Left Ventricle: Left ventricular ejection fraction, by estimation, is 20 to 25%. The left ventricle has severely decreased function. The left ventricle demonstrates global hypokinesis. The left ventricular internal cavity size was normal in size. There is mild concentric left ventricular hypertrophy. Abnormal (paradoxical) septal motion, consistent with left bundle branch block. Left ventricular diastolic function could not be evaluated due to atrial fibrillation. Left ventricular diastolic parameters are consistent with Grade I diastolic dysfunction (impaired relaxation). Normal left ventricular filling pressure. Right Ventricle: The right ventricular size is normal. No increase in right ventricular wall thickness. Right ventricular systolic function is normal. Left Atrium: Left atrial size was normal in size. Right Atrium: Right atrial size was normal in size. Pericardium: There is no evidence of pericardial effusion. Mitral Valve: The mitral valve is normal in structure. Mild mitral valve regurgitation. No evidence of mitral valve stenosis. Tricuspid Valve: The tricuspid valve is normal in structure. Tricuspid valve regurgitation is not demonstrated. No evidence of tricuspid stenosis. Aortic Valve: The aortic valve is normal in structure. Aortic valve regurgitation is trivial. No aortic stenosis is present. Pulmonic Valve: The pulmonic valve was normal in  structure. Pulmonic valve regurgitation is not visualized. No evidence of pulmonic stenosis. Aorta: The aortic root is normal in size and structure. Venous: The inferior vena cava is normal in size with greater than 50% respiratory variability, suggesting right atrial pressure of 3 mmHg. IAS/Shunts: No atrial level shunt detected by color flow Doppler.  LEFT VENTRICLE PLAX 2D LVIDd:         5.20 cm  Diastology LVIDs:  4.40 cm  LV e' medial:    5.22 cm/s LV PW:         1.20 cm  LV E/e' medial:  9.6 LV IVS:        1.20 cm  LV e' lateral:   4.03 cm/s LVOT diam:     2.50 cm  LV E/e' lateral: 12.4 LV SV:         81 LV SV Index:   36 LVOT Area:     4.91 cm  RIGHT VENTRICLE RV S prime:     18.40 cm/s TAPSE (M-mode): 1.9 cm LEFT ATRIUM             Index LA diam:        3.80 cm 1.66 cm/m LA Vol (A2C):   69.3 ml 30.35 ml/m LA Vol (A4C):   51.9 ml 22.73 ml/m LA Biplane Vol: 60.0 ml 26.28 ml/m  AORTIC VALVE LVOT Vmax:   78.30 cm/s LVOT Vmean:  50.400 cm/s LVOT VTI:    0.166 m  AORTA Ao Asc diam: 3.80 cm MITRAL VALVE MV Area (PHT): 2.87 cm    SHUNTS MV Decel Time: 264 msec    Systemic VTI:  0.17 m MV E velocity: 50.10 cm/s  Systemic Diam: 2.50 cm MV A velocity: 83.60 cm/s MV E/A ratio:  0.60 Ena Dawley MD Electronically signed by Ena Dawley MD Signature Date/Time: 10/05/2020/11:54:46 AM    Final     Labs:  CBC: Recent Labs    10/13/20 0553 10/18/20 0720 10/19/20 1126 10/20/20 0752  WBC 15.5* 21.2* 14.9* 11.2*  HGB 9.8* 9.5* 9.0* 8.6*  HCT 30.4* 29.4* 27.5* 25.7*  PLT 279 443* 504* 450*    COAGS: Recent Labs    10/04/20 1808 10/04/20 2324 10/05/20 0054 10/05/20 0500  INR 1.1 1.3* 1.2  --   APTT  --   --  >200* 40*    BMP: Recent Labs    06/01/20 0858 06/19/20 1016 10/04/20 1802 10/17/20 1420 10/18/20 0720 10/19/20 0048 10/20/20 0752  NA 139 142   < > 144 142 141 140  K 4.6 4.6   < > 4.7 4.7 4.7 4.6  CL 102 105   < > 116* 113* 110 108  CO2 20 22   < > 14* 13* 14* 13*   GLUCOSE 127* 121*   < > 158* 157* 125* 110*  BUN 20 16   < > 67* 81* 96* 121*  CALCIUM 9.8 9.8   < > 8.4* 8.3* 8.2* 7.9*  CREATININE 1.51* 1.38*   < > 3.89* 5.02* 6.24* 8.02*  GFRNONAA 44* 49*   < > 15* 11* 9* 6*  GFRAA 51* 57*  --   --   --   --   --    < > = values in this interval not displayed.    LIVER FUNCTION TESTS: Recent Labs    10/07/20 0409 10/08/20 0500 10/09/20 0402 10/10/20 0311 10/19/20 0048 10/20/20 0752  BILITOT 0.8 1.3* 1.4* 1.5*  --   --   AST 164* 107* 50* 41  --   --   ALT 98* 83* 53* 51*  --   --   ALKPHOS 32* 45 39 47  --   --   PROT 5.8* 6.2* 5.3* 5.7*  --   --   ALBUMIN 3.1* 3.1* 2.4* 2.4* 2.4* 2.4*    TUMOR MARKERS: No results for input(s): AFPTM, CEA, CA199, CHROMGRNA in the last 8760 hours.  Assessment and Plan: 75 y.o.  yo male with hx DM, HTN, left bundle branch block, CHF with reduced EF, meningioma, CKD stage III with baseline creatinine level 1.4-1.5 admitted with cardiac arrest with inferior STEMI, PCI to LAD 9/68/95 and complicated by pneumonia and AKI. He had AKI after admission due to cardiac arrest/contrast which had some recovery however developed AKI again probably due to cardiorenal syndrome/reduced renal perfusion.   Urine output is decent however worsening renal labs associated with uremia and acidosis. Current creat 8.02(6.24). Never been dialyzed before. On brilinta, lovenox, aspirin.Request received for tunneled HD cath placement. Details/risks of procedure, incl but not limited to, internal bleeding, infection, injury to adjacent structures d/w pt/spouse with their understanding and consent. Procedure  planned for 2/9.    Thank you for this interesting consult.  I greatly enjoyed meeting Reznor Ferrando and look forward to participating in their care.  A copy of this report was sent to the requesting provider on this date.  Electronically Signed: D. Rowe Robert, PA-C 10/20/2020, 11:16 AM   I spent a total of 30 minutes   in  face to face in clinical consultation, greater than 50% of which was counseling/coordinating care for tunneled hemodialysis catheter placement

## 2020-10-20 NOTE — Progress Notes (Signed)
Text paged Dr. Paticia Stack that patient has not voided tonight and has tried. Bladder scan 957 ml Reden R.N. aware

## 2020-10-20 NOTE — Progress Notes (Signed)
Patient is confused wants to get out of bed and pulls tele. Off , wanting I.V. out Thinks he is in [place] 1 mg I.V. given w/ 10 ml Of n.s.

## 2020-10-20 NOTE — Progress Notes (Signed)
Informed of urinary retention. Same issue the night prior. I/O cath yielded around 1400cc of urine the night prior and today I/O cath yielded ~2L of urine. Understandably, is due to have a TDC placed via IR tomorrow with potential plans to start HD in the setting of uremia which may very well be reasonable for now. Recommend foley placement for now and AM labs to be drawn as early as possible to see if we should proceed with dialysis catheter placement and HD in AM. Will discuss with rounding nephrologist as well. Appreciate being informed and assistance from RN.  Gean Quint, MD [facility]

## 2020-10-20 NOTE — Progress Notes (Signed)
In and Out cath 1425.Patient tol. Well. R.N. aware

## 2020-10-20 NOTE — Progress Notes (Signed)
Physical Therapy Treatment Patient Details Name: Timothy Moran MRN: [id number] DOB: [birth year] Today's Date: 10/20/2020    History of Present Illness Pt is 75 year old male with a history of LBBB, mildly reduced LVEF, 4.2cm TAA, multiple symptomatic meningiomas (possible NF2 suspected), CKD stage III, two prior pulmonary embolisms (now off Eliquis), HTN, and pulmonary nodules, who presented as a resuscitated VF arrest at home and was found to have inferior ST elevation on ECG. He began having chest pain while shoveling snow this afternoon. He collapsed and lost consciousness with no obvious injuries in the fall. No bystander CPR performed. He was found with agonal breathing and an AED-shockable rhythm. He received a total of 4 defibrillations by fire and EMS.  Patient is more alert, but remains confused with decreased awareness and safety.  Pt s/p cardiac cath with stent on 1/23 and was intubated until 1/25. Plan for HD on 2/8 due to elevated Cr.    PT Comments    Pt received in supine, initially drowsy but easily awoken, A&O x2 and spouse present/supportive. Pt with good participation, eager to progress mobility but continues to need instruction on safety due to impulsivity/decreased balance with multi-tasking such as environmental scanning and head turns during mobility. Pt min/modA for transfers and modA for safety with gait using RW and chair follow. Pt respiratory rate remains elevated during mobility and pt given seated rest breaks when fatigued/tachypneic (mid-30's during mobility). Pt benefits from cues for slow, pursed-lip breathing, will need reinforcement due to short term memory deficit, but noted memory improvement from previous session. Will plan to continue gait/balance training next session with cognitive challenges as pt remains below functional/cognitive baseline. Pt continues to benefit from PT services to progress toward functional mobility goals. Continue to recommend CIR.  Follow  Up Recommendations  CIR     Equipment Recommendations  Rolling walker with 5" wheels;Wheelchair (measurements PT);Wheelchair cushion (measurements PT)    Recommendations for Other Services       Precautions / Restrictions Precautions Precautions: Fall Precaution Comments: impulsive; watch RR Restrictions Weight Bearing Restrictions: No    Mobility  Bed Mobility Overal bed mobility: Needs Assistance Bed Mobility: Supine to Sit;Sit to Supine     Supine to sit: Min guard;HOB elevated     General bed mobility comments: Extra time, use of bed features/rails to perform. remained up in chair at end  Transfers Overall transfer level: Needs assistance Equipment used: Rolling walker (2 wheeled) Transfers: Sit to/from Stand Sit to Stand: Min assist         General transfer comment: Sit to stand 1x from EOB and x2 from chair, cues not to pull up on RW. MinA and extra time to power up to stand, needs min/modA upon initially standing due to LOB but corrects with RW and external assist  Ambulation/Gait Ambulation/Gait assistance: Mod assist Gait Distance (Feet): 100 Feet (177ft, 71ft with seated break between) Assistive device: Rolling walker (2 wheeled) Gait Pattern/deviations: Step-through pattern;Decreased stride length;Trunk flexed;Drifts right/left;Wide base of support;Leaning posteriorly (stiff gait pattern) Gait velocity: reduced   General Gait Details: Pt impulsive reaching off BOS on occasion and lifting RW needing reminders to keep hands on RW and RW on floor. Intermittent LOB posteriorly or laterally with min-modA to recover. ModA to manage RW with turns and posterior steps. Difficulty managing in tight spaces. Chair follow for safety. Multiple standing breaks due to elevated RR ~32 rpm   Stairs             Wheelchair  Mobility    Modified Rankin (Stroke Patients Only)       Balance Overall balance assessment: Needs assistance Sitting-balance support:  Bilateral upper extremity supported;Feet supported Sitting balance-Leahy Scale: Poor Sitting balance - Comments: Prefers UE support at EOB, supervision for safety.   Standing balance support: Bilateral upper extremity supported;Single extremity supported Standing balance-Leahy Scale: Poor Standing balance comment: Reliant on UEs on RW and external assist. Able to reach 1 UE off BOS but trunk sway noted. Pt able to don mask in stance with modA; minA for static standing at [employer] Arousal/Alertness: Awake/alert Behavior During Therapy: Impulsive Overall Cognitive Status: Impaired/Different from baseline Area of Impairment: Attention;Following commands;Safety/judgement;Awareness;Problem solving;Memory;Orientation                 Orientation Level: Disoriented to;Place;Time Current Attention Level: Sustained Memory: Decreased short-term memory Following Commands: Follows one step commands consistently;Follows one step commands with increased time;Follows multi-step commands inconsistently Safety/Judgement: Decreased awareness of deficits;Decreased awareness of safety Awareness: Emergent Problem Solving: Requires verbal cues;Difficulty sequencing General Comments: Pt impulsive and needing repeated cues throughout to maintain safety. Pt able to state daughter's name this date (could not on Fri), not oriented to state/city but able to state he was in hospital, slightly improved tolerance to distraction but does lose balance with environmental scanning unless prompted to stop prior to looking around      Exercises      General Comments General comments (skin integrity, edema, etc.): respiratory rate varying 25 rpm at rest up to 40 rpm after gait trial; ~mid-30's during gait trial      Pertinent Vitals/Pain Pain Assessment: Faces Faces Pain Scale: Hurts a little bit Pain Location: Soreness in chest from CPR Pain Descriptors / Indicators:  Grimacing;Discomfort Pain Intervention(s): Monitored during session;Repositioned    Home Living                      Prior Function            PT Goals (current goals can now be found in the care plan section) Acute Rehab PT Goals Patient Stated Goal: to get stronger PT Goal Formulation: With patient/family Time For Goal Achievement: 10/24/20 Potential to Achieve Goals: Good Progress towards PT goals: Progressing toward goals    Frequency    Min 3X/week      PT Plan Current plan remains appropriate    Co-evaluation              AM-PAC PT "6 Clicks" Mobility   Outcome Measure  Help needed turning from your back to your side while in a flat bed without using bedrails?: A Little Help needed moving from lying on your back to sitting on the side of a flat bed without using bedrails?: A Little Help needed moving to and from a bed to a chair (including a wheelchair)?: A Little Help needed standing up from a chair using your arms (e.g., wheelchair or bedside chair)?: A Little Help needed to walk in hospital room?: A Lot Help needed climbing 3-5 steps with a railing? : Total 6 Click Score: 15    End of Session Equipment Utilized During Treatment: Gait belt Activity Tolerance: Patient tolerated treatment well Patient left: with call bell/phone within reach;with family/visitor present;in chair;with chair alarm set Nurse Communication: Mobility status PT Visit Diagnosis: Unsteadiness on feet (  R26.81);Other abnormalities of gait and mobility (R26.89);Muscle weakness (generalized) (M62.81);Difficulty in walking, not elsewhere classified (R26.2)     Time: 1130-1156 PT Time Calculation (min) (ACUTE ONLY): 26 min  Charges:  $Gait Training: 8-22 mins $Therapeutic Activity: 8-22 mins                     Rhythm Gubbels P., PTA Acute Rehabilitation Services Pager: [phone] Office: [place] 10/20/2020, 12:34 PM

## 2020-10-20 NOTE — Progress Notes (Signed)
Physical therapy goal update. Present during today's session with PTA and updated goals [place] Pager: [phone]: [phone]

## 2020-10-20 NOTE — Progress Notes (Signed)
[facility] NEPHROLOGY PROGRESS NOTE  Assessment/ Plan: Pt is a 75 y.o. yo male with DM, HTN, left bundle branch block, CHF with reduced EF, meningioma, CKD stage III with baseline creatinine level 1.4-1.5 admitted with Cardiac arrest with inferior STEMI, PCI to LAD and complicated by pneumonia and AKI.  #Acute kidney injury on CKD stage III: Due to cardiorenal syndrome. He had AKI after admission due to cardiac arrest/contrast which had some recovery however developed AKI again probably due to cardiorenal syndrome/reduced renal perfusion.   Urine output is decent however worsening renal labs associated with uremia and acidosis.  Discussed with the patient and his wife about starting dialysis.  They agreed with the plan. Consult IR to place tunneled catheter and initiate dialysis. He may need outpatient HD center arrangement for AKI.  #Cardiac arrest with inferolateral STEMI: Status post emergent coronary angiography on 1/23 with drug-eluting stent placement to dominant circumflex vessel and reestablishment of flow without any postprocedural complications noted.  Ongoing risk factor modification at this time with blood pressure control/lipid control and ARB on hold.  # Acute encephalopathy: Now more confused to because of uremia.  Plan to initiate dialysis as discussed above.  # Acute exacerbation of congestive heart failure: No lower extremity edema however has pulmonary edema in x-ray.  Volume management with dialysis.  #Anemia: Iron saturation 10%.  Start IV iron and ESA.  #Metabolic acidosis: On sodium bicarbonate, now we will manage with dialysis.  # Leukocytosis: Chest x-ray has some atelectasis.  Defer to primary team.  Subjective: Seen and examined at bedside.  Required a straight cath with 1.4 L of urine output.  He was more confused and little worsening shortness of breath.  BUN went up to 121 and creatinine 8.  His wife at bedside.  Agreed with starting  dialysis. Objective Vital signs in last 24 hours: Vitals:   10/19/20 2356 10/20/20 0449 10/20/20 0500 10/20/20 0814  BP: 118/76 125/78  113/66  Pulse: 76 76  75  Resp: 20 20  19   Temp: 97.8 F (36.6 C) 98.1 F (36.7 C)  98.2 F (36.8 C)  TempSrc: Oral Oral  Oral  SpO2: 94% 93%  93%  Weight:   100.5 kg   Height:       Weight change: 0.172 kg  Intake/Output Summary (Last 24 hours) at 10/20/2020 1025 Last data filed at 10/20/2020 0930 Gross per 24 hour  Intake 363 ml  Output 1427 ml  Net -1064 ml       Labs: Basic Metabolic Panel: Recent Labs  Lab 10/18/20 0720 10/19/20 0048 10/20/20 0752  NA 142 141 140  K 4.7 4.7 4.6  CL 113* 110 108  CO2 13* 14* 13*  GLUCOSE 157* 125* 110*  BUN 81* 96* 121*  CREATININE 5.02* 6.24* 8.02*  CALCIUM 8.3* 8.2* 7.9*  PHOS  --  7.8* 10.3*   Liver Function Tests: Recent Labs  Lab 10/19/20 0048 10/20/20 0752  ALBUMIN 2.4* 2.4*   No results for input(s): LIPASE, AMYLASE in the last 168 hours. No results for input(s): AMMONIA in the last 168 hours. CBC: Recent Labs  Lab 10/18/20 0720 10/19/20 1126 10/20/20 0752  WBC 21.2* 14.9* 11.2*  NEUTROABS  --  12.3* 8.7*  HGB 9.5* 9.0* 8.6*  HCT 29.4* 27.5* 25.7*  MCV 94.8 93.9 93.1  PLT 443* 504* 450*   Cardiac Enzymes: No results for input(s): CKTOTAL, CKMB, CKMBINDEX, TROPONINI in the last 168 hours. CBG: Recent Labs  Lab 10/19/20 1223 10/19/20  1616 10/19/20 2054 10/20/20 0640 10/20/20 0811  GLUCAP 169* 138* 126* 109* 102*    Iron Studies:  Recent Labs    10/19/20 1126  IRON 26*  TIBC 249*  FERRITIN 633*   Studies/Results: DG Chest 1 View  Result Date: 10/18/2020 CLINICAL DATA:  CHF. EXAM: CHEST  1 VIEW COMPARISON:  10/15/2020; 10/08/2018; 1/23/2 FINDINGS: Grossly unchanged enlarged cardiac silhouette and mediastinal contours with prominence of the pulmonary vasculature. Pulmonary vasculature remains indistinct with cephalization of flow. Grossly unchanged  perihilar and bilateral medial basilar heterogeneous opacities. No new focal airspace opacities. No definite pleural effusion or pneumothorax. No acute osseous abnormalities. Degenerative change of the right glenohumeral and acromioclavicular joints is suspected though incompletely evaluated. IMPRESSION: Similar findings of cardiomegaly, pulmonary edema and perihilar/bibasilar atelectasis. Electronically Signed   By: Sandi Mariscal M.D.   On: 10/18/2020 12:54   US RENAL  Result Date: 10/18/2020 CLINICAL DATA:  Acute kidney injury. EXAM: RENAL / URINARY TRACT ULTRASOUND COMPLETE COMPARISON:  May 03, 2018. FINDINGS: Right Kidney: Renal measurements: 11.4 x 6.6 x 5.4 cm = volume: 210 mL. Echogenicity within normal limits. No mass or hydronephrosis visualized. Left Kidney: Renal measurements: 12.7 x 6.4 x 6.4 cm = volume: 270 mL. Echogenicity within normal limits. No mass or hydronephrosis visualized. Bladder: Appears normal for degree of bladder distention. Other: None. IMPRESSION: Normal renal ultrasound. Electronically Signed   By: Marijo Conception M.D.   On: 10/18/2020 16:26    Medications: Infusions: . sodium chloride Stopped (10/14/20 1029)    Scheduled Medications: . aspirin  81 mg Oral Daily  . atorvastatin  80 mg Oral Daily  . bisacodyl  10 mg Rectal Q0600  . chlorhexidine  15 mL Mouth Rinse BID  . Chlorhexidine Gluconate Cloth  6 each Topical Daily  . enoxaparin (LOVENOX) injection  30 mg Subcutaneous Q24H  . feeding supplement (NEPRO CARB STEADY)  237 mL Oral BID BM  . guaiFENesin  10 mL Oral Q4H  . hydrALAZINE  25 mg Oral Q8H  . insulin aspart  0-15 Units Subcutaneous TID WC  . insulin aspart  0-5 Units Subcutaneous QHS  . isosorbide dinitrate  30 mg Oral BID  . mouth rinse  15 mL Mouth Rinse q12n4p  . metoprolol succinate  25 mg Oral Daily  . multivitamin with minerals  1 tablet Oral Daily  . senna-docusate  1 tablet Oral QHS  . sodium bicarbonate  1,300 mg Oral BID  . sodium  chloride flush  3 mL Intravenous Q12H  . ticagrelor  90 mg Oral BID    have reviewed scheduled and prn medications.  Physical Exam: General:NAD, somewhat confused Heart:RRR, s1s2 nl, no rubs Lungs: Bibasal crackles, no increased work of breathing Abdomen:soft, Non-tender, non-distended Extremities:No edema Neurology: Alert awake  Timothy Moran Tanna Furry 10/20/2020,10:25 AM  LOS: 16 days

## 2020-10-20 NOTE — Care Management Important Message (Signed)
Important Message  Patient Details  Name: Timothy Moran MRN: [id number] Date of Birth: 08-02-1946   Medicare Important Message Given:  Yes     [name] 10/20/2020, 11:00 AM

## 2020-10-20 NOTE — Progress Notes (Addendum)
Progress Note  Patient Name: Timothy Moran Date of Encounter: 10/20/2020  [facility] Cardiologist: Jenne Campus, MD   Subjective   Feels good.  No chest pain or SOB  Inpatient Medications    Scheduled Meds: . aspirin  81 mg Oral Daily  . atorvastatin  80 mg Oral Daily  . bisacodyl  10 mg Rectal Q0600  . chlorhexidine  15 mL Mouth Rinse BID  . Chlorhexidine Gluconate Cloth  6 each Topical Daily  . enoxaparin (LOVENOX) injection  30 mg Subcutaneous Q24H  . feeding supplement (NEPRO CARB STEADY)  237 mL Oral BID BM  . guaiFENesin  10 mL Oral Q4H  . hydrALAZINE  25 mg Oral Q8H  . insulin aspart  0-15 Units Subcutaneous TID WC  . insulin aspart  0-5 Units Subcutaneous QHS  . isosorbide dinitrate  30 mg Oral BID  . mouth rinse  15 mL Mouth Rinse q12n4p  . metoprolol succinate  25 mg Oral Daily  . multivitamin with minerals  1 tablet Oral Daily  . senna-docusate  1 tablet Oral QHS  . sodium bicarbonate  1,300 mg Oral BID  . sodium chloride flush  3 mL Intravenous Q12H  . ticagrelor  90 mg Oral BID   Continuous Infusions: . sodium chloride Stopped (10/14/20 1029)   PRN Meds: sodium chloride, acetaminophen, albuterol, diphenhydrAMINE, haloperidol lactate, LORazepam, melatonin, morphine injection, oxyCODONE, simethicone, sodium chloride flush, white petrolatum   Vital Signs    Vitals:   10/19/20 2356 10/20/20 0449 10/20/20 0500 10/20/20 0814  BP: 118/76 125/78  113/66  Pulse: 76 76  75  Resp: 20 20  19   Temp: 97.8 F (36.6 C) 98.1 F (36.7 C)  98.2 F (36.8 C)  TempSrc: Oral Oral  Oral  SpO2: 94% 93%  93%  Weight:   100.5 kg   Height:        Intake/Output Summary (Last 24 hours) at 10/20/2020 1022 Last data filed at 10/20/2020 0930 Gross per 24 hour  Intake 363 ml  Output 1427 ml  Net -1064 ml   Last 3 Weights 10/20/2020 10/19/2020 10/18/2020  Weight (lbs) 221 lb 8 oz 221 lb 1.9 oz 225 lb 1.4 oz  Weight (kg) 100.472 kg 100.3 kg 102.1 kg      Telemetry     SR with PACs - Personally Reviewed  ECG    SR LBBB - Personally Reviewed  Physical Exam   GEN: No acute distress.   Neck: No JVD Cardiac: RRR, no murmurs, rubs, or gallops.  Respiratory: Clear to dimihnished to auscultation bilaterally. GI: Soft, nontender, non-distended  MS: No edema; No deformity. Neuro:  Nonfocal  Psych: Normal affect   Labs    High Sensitivity Troponin:   Recent Labs  Lab 10/04/20 1808 10/04/20 2324 10/05/20 0500  TROPONINIHS 75* >27,000* >27,000*      Chemistry Recent Labs  Lab 10/18/20 0720 10/19/20 0048 10/20/20 0752  NA 142 141 140  K 4.7 4.7 4.6  CL 113* 110 108  CO2 13* 14* 13*  GLUCOSE 157* 125* 110*  BUN 81* 96* 121*  CREATININE 5.02* 6.24* 8.02*  CALCIUM 8.3* 8.2* 7.9*  ALBUMIN  --  2.4* 2.4*  GFRNONAA 11* 9* 6*  ANIONGAP 16* 17* 19*     Hematology Recent Labs  Lab 10/18/20 0720 10/19/20 1126 10/20/20 0752  WBC 21.2* 14.9* 11.2*  RBC 3.10* 2.93* 2.76*  HGB 9.5* 9.0* 8.6*  HCT 29.4* 27.5* 25.7*  MCV 94.8 93.9 93.1  MCH 30.6  30.7 31.2  MCHC 32.3 32.7 33.5  RDW 14.5 14.4 14.4  PLT 443* 504* 450*    BNPNo results for input(s): BNP, PROBNP in the last 168 hours.   DDimer No results for input(s): DDIMER in the last 168 hours.   Radiology    DG Chest 1 View  Result Date: 10/18/2020 CLINICAL DATA:  CHF. EXAM: CHEST  1 VIEW COMPARISON:  10/15/2020; 10/08/2018; 1/23/2 FINDINGS: Grossly unchanged enlarged cardiac silhouette and mediastinal contours with prominence of the pulmonary vasculature. Pulmonary vasculature remains indistinct with cephalization of flow. Grossly unchanged perihilar and bilateral medial basilar heterogeneous opacities. No new focal airspace opacities. No definite pleural effusion or pneumothorax. No acute osseous abnormalities. Degenerative change of the right glenohumeral and acromioclavicular joints is suspected though incompletely evaluated. IMPRESSION: Similar findings of cardiomegaly, pulmonary  edema and perihilar/bibasilar atelectasis. Electronically Signed   By: Sandi Mariscal M.D.   On: 10/18/2020 12:54   US RENAL  Result Date: 10/18/2020 CLINICAL DATA:  Acute kidney injury. EXAM: RENAL / URINARY TRACT ULTRASOUND COMPLETE COMPARISON:  May 03, 2018. FINDINGS: Right Kidney: Renal measurements: 11.4 x 6.6 x 5.4 cm = volume: 210 mL. Echogenicity within normal limits. No mass or hydronephrosis visualized. Left Kidney: Renal measurements: 12.7 x 6.4 x 6.4 cm = volume: 270 mL. Echogenicity within normal limits. No mass or hydronephrosis visualized. Bladder: Appears normal for degree of bladder distention. Other: None. IMPRESSION: Normal renal ultrasound. Electronically Signed   By: Marijo Conception M.D.   On: 10/18/2020 16:26    Cardiac Studies   Cath 10/04/20 Conclusions: 1. Significant two-vessel coronary artery disease including thrombotic occlusion of the distal LCx, which is the culprit for the patient'sinferior STEMIas well as sequential 50% and 80% proximal/mid LAD lesions. 2. Mildly elevated left ventricular filling pressure (LVEDP 20-25 mmHg). 3. SuccessfulPCI to distal LCx using Resolute Onyx 2.5 x 30 mm drug-eluting stent complicated by no reflow treated with intracoronary adenosine and nitroglycerin, as well as aggressive antiplatelet therapy. Final angiogram shows 0% residual stenosis with TIMI-2 flow.  Recommendations: 1. Admit to 2H-ICU for post STEMI/cardiac arrestmonitoring. Consider targeted temperature management; critical care medicine has been consulted to assist with targeted temperature and ventilator management. 2. Continue tirofiban infusion for 6 hours. 3. Loaded with ticagrelor when patient arrives in ICU; patient should complete 12 months of dual antiplatelet therapy with aspirin and ticagrelor. 4. Obtain echocardiogram. 5. Aggressive secondary prevention of coronary artery disease including high intensity statin therapy. 6. Continue IV amiodarone  overnight. 7. Consider staged PCI to LAD.  Echocardiogram 10/05/20 1. Since the study on 06/19/2020 LVEF has decreased, now severely impaired  at 20-25% and diffuse hypokinesis.  2. Left ventricular ejection fraction, by estimation, is 20 to 25%. The  left ventricle has severely decreased function. The left ventricle  demonstrates global hypokinesis. There is mild concentric left ventricular  hypertrophy. Left ventricular diastolic  parameters are consistent with Grade I diastolic dysfunction (impaired  relaxation).  3. Right ventricular systolic function is normal. The right ventricular  size is normal.  4. The mitral valve is normal in structure. Mild mitral valve  regurgitation. No evidence of mitral stenosis.  5. The aortic valve is normal in structure. Aortic valve regurgitation is  trivial. No aortic stenosis is present.  6. The inferior vena cava is normal in size with greater than 50%  respiratory variability, suggesting right atrial pressure of 3 mmHg.    Patient Profile     75 y.o. male with a PMH of LBBB,  mildly reduced LVEF, thoracic aortic aneurysm (4.2cm), HTN, multiple symptomatic meningiomas (possible NF2 suspected), 2 prior PE's no longer on anticoagulation, and CKD stage 3, who presented with out of hospital VF arrest with successful resuscitation with post resuscitation EKG revealing inferior STEMI.    Assessment & Plan      1. Inferolateral STEMI precipitated by VF arrest: patient had out of hospital VF arrest with successful ROSC after 16 minutes of ACLS including multiple defibrillations. Post ROSC EKG with interolateral STE. He was taken emergently to the cath lab 10/04/20 where he was found to have significatn 2-vessel CAD with PCI/DES to LCx, started on aspirin and brilinta, with plans to consider staged PCI to LAD in the future. HsTrop peaked at >27000. He was maintained on IV amiodarone initially, though this was discontinued after revascularization. No  further episodes of VF/VT noted on telemetry. Still with some ongoing chest discomfort which was felt to be MSK given resuscitative efforts - Continue aspirin and brilinta - Continue statin - Continue BBlocker - Continue isosorbide dinitrate.  - Plans for staged intervention will be delayed pending recovery of renal function and symptomology   2. Acute combined CHF/CM possible ischemic: Echo this admission showed EF 20-25%, with diffuse hypokinesis, mild concentric LVH, G1DD, and mild MR. He was started on IV lasix 40mg  BID yesterday given increased weight and SOB. UOP is inaccurate with several unmeasured UOP occurrences. Weight is down to 221lbs today from 225lbs yesterday with peak weight 240lbs this admission. GDMT has been limited by AoCKD this admission - Cr 1.8 on admission, then up to 2.97 initially with some improvement, however now 3.89>5.02>6.24 >8.02 over the past 4 days.  - held lasix given continued uptrending of Cr Nephrology managing  - May need HD in the next today for volume management given uptrending Cr and ongoing pulmonary edema on CXR yesterday. - Continue metoprolol succinate, hydralazine, and isordil  3. AKI/AoCKD stage 3: Cr 1.8 on admission, then up to 2.97 with some initial improvement, however over the past 3 days has risen significantly 3.89>5.02>6.24>8.02.. Nephrology following. Urine studies suggestive of intrinsic etiology. Renal ultrasound was unremarkable. Started on sodium bicarb BID today given mild metabolic acidosis with anion gap 17 on labs today. Holding diuretics. May need to initiate HD today if Cr continues to worsen - this was discussed with the patient/wife today.  Nephrology has seen and IR to place line catheter today - wife agreeable to dialyisis  - Appreciate nephrology's assistance - Will continue to monitor Cr closely and avoid nephrotoxic agents  4. Leukocytosis: WBC uptrended to 21.2 now at 11.2  He has been coughing, though no significant  change in the past several days. No clear infection on CXR yesterday and he remains afebrile. No complaints of dysuria, hematuria, chills/sweats.  - Low threshold to pan culture if fever, change in symptoms, or significant rise in WBC on repeat labs today - Encouraged incentive spirometer use - order placed.   5. Anemia: Hgb was 13 on admission, down to 9.2 in the setting of an oozing central line. Hgb increased to 11.2, then back down to 9.5 now 8.6, though generally stable over the past few days. No evidence of bleeding on DAPT. - iron studies with iron 26, iron sat 10, ferritin 633 Nephrology beginning IV iron and ESA.   6. DM type 2: Hgb A1C 7.0 this admission. This is a new diagnosis. On ISS.  - Will place consult to DM coordinator as previously recommended.   7. History  of PE's: most recently noted to have PE 08/03/20 and recommended for 3 month course of eliquis, however patient self discontinued this at home. Anticoagulation not restarted this admission  8. Encephalopathy: felt to be 2/2 mild anoxic brain injury. Mental status continues to improve. He does have a mild tremor on bilateral upper extremities that was not present prior to his MI. Query whether this is related.  Better today, ate BK.   - Will discuss any additional work-up with Dr. Percival Spanish  9. Constipation: resolved, now having multiple BMs per day.  - Will de-escalate bowel regimen  For questions or updates, please contact [place] Please consult [email or web address] for contact info under      Signed, Cecilie Kicks, NP  10/20/2020, 10:22 AM    History and all data above reviewed.  Patient examined.  I agree with the findings as above.  He is fatigued but more alert.  Chest pain where he had chest compressions.  No acute SOB.  Ambulated.  WBCs coming down.    More The patient exam reveals COR:RRR  ,  Lungs: Clear  ,  Abd: Positive bowel sounds, no rebound no guarding, Ext No edema  .  All available labs, radiology  testing, previous records reviewed. Agree with documented assessment and plan.   Cardiac arrest:  He is making slow progress.  Eventually he will need rehab and we talked about this and he has been seen by the rehab service.  Renal failure:  To start dialysis today or tomorrow.  WBCs:  Trending down without localizing source.    Jeneen Rinks Quinteria Chisum  12:55 PM  10/20/2020

## 2020-10-21 DIAGNOSIS — N179 Acute kidney failure, unspecified: Secondary | ICD-10-CM | POA: Diagnosis not present

## 2020-10-21 DIAGNOSIS — I469 Cardiac arrest, cause unspecified: Secondary | ICD-10-CM | POA: Diagnosis not present

## 2020-10-21 DIAGNOSIS — I4901 Ventricular fibrillation: Secondary | ICD-10-CM | POA: Diagnosis not present

## 2020-10-21 LAB — GLUCOSE, CAPILLARY
Glucose-Capillary: 111 mg/dL — ABNORMAL HIGH (ref 70–99)
Glucose-Capillary: 181 mg/dL — ABNORMAL HIGH (ref 70–99)
Glucose-Capillary: 126 mg/dL — ABNORMAL HIGH (ref 70–99)
Glucose-Capillary: 113 mg/dL — ABNORMAL HIGH (ref 70–99)

## 2020-10-21 LAB — CBC WITH DIFFERENTIAL/PLATELET
Abs Immature Granulocytes: 0.06 10*3/uL (ref 0.00–0.07)
Basophils Absolute: 0.1 10*3/uL (ref 0.0–0.1)
Basophils Relative: 1 %
Eosinophils Absolute: 0.2 10*3/uL (ref 0.0–0.5)
Eosinophils Relative: 3 %
HCT: 25.2 % — ABNORMAL LOW (ref 39.0–52.0)
Hemoglobin: 8.4 g/dL — ABNORMAL LOW (ref 13.0–17.0)
Immature Granulocytes: 1 %
Lymphocytes Relative: 15 %
Lymphs Abs: 1.4 10*3/uL (ref 0.7–4.0)
MCH: 31.2 pg (ref 26.0–34.0)
MCHC: 33.3 g/dL (ref 30.0–36.0)
MCV: 93.7 fL (ref 80.0–100.0)
Monocytes Absolute: 0.8 10*3/uL (ref 0.1–1.0)
Monocytes Relative: 8 %
Neutro Abs: 6.5 10*3/uL (ref 1.7–7.7)
Neutrophils Relative %: 72 %
Platelets: 462 10*3/uL — ABNORMAL HIGH (ref 150–400)
RBC: 2.69 MIL/uL — ABNORMAL LOW (ref 4.22–5.81)
RDW: 14.3 % (ref 11.5–15.5)
WBC: 8.9 10*3/uL (ref 4.0–10.5)
nRBC: 0 % (ref 0.0–0.2)

## 2020-10-21 LAB — RENAL FUNCTION PANEL
Albumin: 2.4 g/dL — ABNORMAL LOW (ref 3.5–5.0)
Anion gap: 18 — ABNORMAL HIGH (ref 5–15)
BUN: 118 mg/dL — ABNORMAL HIGH (ref 8–23)
CO2: 15 mmol/L — ABNORMAL LOW (ref 22–32)
Calcium: 7.8 mg/dL — ABNORMAL LOW (ref 8.9–10.3)
Chloride: 111 mmol/L (ref 98–111)
Creatinine, Ser: 7.24 mg/dL — ABNORMAL HIGH (ref 0.61–1.24)
GFR, Estimated: 7 mL/min — ABNORMAL LOW (ref >60)
Glucose, Bld: 110 mg/dL — ABNORMAL HIGH (ref 70–99)
Phosphorus: 8.5 mg/dL — ABNORMAL HIGH (ref 2.5–4.6)
Potassium: 4.2 mmol/L (ref 3.5–5.1)
Sodium: 144 mmol/L (ref 135–145)

## 2020-10-21 MED ORDER — DARBEPOETIN ALFA 60 MCG/0.3ML IJ SOSY
60.0000 ug | PREFILLED_SYRINGE | INTRAMUSCULAR | Status: DC
  Filled 2020-10-21: qty 0.3

## 2020-10-21 MED ORDER — DARBEPOETIN ALFA 100 MCG/0.5ML IJ SOSY
100.0000 ug | PREFILLED_SYRINGE | Freq: Once | INTRAMUSCULAR | Status: AC
  Administered 2020-10-21: 100 ug via SUBCUTANEOUS
  Filled 2020-10-21: qty 0.5

## 2020-10-21 MED ORDER — SODIUM BICARBONATE 650 MG PO TABS
1300.0000 mg | ORAL_TABLET | Freq: Three times a day (TID) | ORAL | Status: DC
  Administered 2020-10-21 – 2020-10-29 (×24): 1300 mg via ORAL
  Filled 2020-10-21 (×25): qty 2

## 2020-10-21 NOTE — Progress Notes (Addendum)
[facility] NEPHROLOGY PROGRESS NOTE  Assessment/ Plan: Pt is a 75 y.o. yo male with DM, HTN, left bundle branch block, CHF with reduced EF, meningioma, CKD stage III with baseline creatinine level 1.4-1.5 admitted with Cardiac arrest with inferior STEMI, PCI to LAD and complicated by pneumonia and AKI.  #Acute kidney injury on CKD stage III: Due to cardiorenal syndrome. He had AKI after admission due to cardiac arrest/contrast which had some recovery however developed AKI again probably due to cardiorenal syndrome/reduced renal perfusion concomitant with acute urine retention. Straight cath with 1.4 L of urine output. The initial plan was to place [facility] by IR and then start dialysis. Given downtrending BUN and creatinine level today and increasing urine output, I canceled HD catheter placement. However, I will keep him n.p.o. from midnight in case if he needs dialysis tomorrow. I spoke with the patient, nurse and IR as well. Continue indwelling Foley catheter, strict ins and out. Daily lab. He clinically looks stable today.  #Cardiac arrest with inferolateral STEMI: Status post emergent coronary angiography on 1/23 with drug-eluting stent placement to dominant circumflex vessel and reestablishment of flow without any postprocedural complications noted.  Ongoing risk factor modification at this time with blood pressure control/lipid control and ARB on hold.  # Acute encephalopathy: Alert awake and oriented today. Overall looks stable.   # Acute exacerbation of congestive heart failure: No lower extremity edema. Lasix on hold because of AKI. Continue monitor.  #Anemia: Iron saturation 10%.  Started IV iron x 3 doses and Aranesp dose on 2/9. Monitor hemoglobin.  #Metabolic acidosis: Increase sodium bicarbonate.  # Leukocytosis: Chest x-ray has some atelectasis. Improved.  Subjective: Seen and examined at bedside. Had a urinary retention with 1.4 L of urine, now has indwelling Foley  catheter. Labs improving. Holding catheter placement and dialysis for today. Daily assessment. Patient is alert awake and looks comfortable. Denies nausea vomiting chest pain shortness of breath. Objective Vital signs in last 24 hours: Vitals:   10/21/20 0005 10/21/20 0329 10/21/20 0459 10/21/20 0848  BP: 114/67 108/63  128/70  Pulse: 75 75  75  Resp: 20 18  17   Temp: 98 F (36.7 C) 98.4 F (36.9 C)  97.9 F (36.6 C)  TempSrc: Oral Oral  Oral  SpO2: 92% 95%  93%  Weight:   96.8 kg   Height:       Weight change: -3.719 kg  Intake/Output Summary (Last 24 hours) at 10/21/2020 0941 Last data filed at 10/21/2020 4656 Gross per 24 hour  Intake 243.22 ml  Output 1480 ml  Net -1236.78 ml       Labs: Basic Metabolic Panel: Recent Labs  Lab 10/19/20 0048 10/20/20 0752 10/21/20 0016  NA 141 140 144  K 4.7 4.6 4.2  CL 110 108 111  CO2 14* 13* 15*  GLUCOSE 125* 110* 110*  BUN 96* 121* 118*  CREATININE 6.24* 8.02* 7.24*  CALCIUM 8.2* 7.9* 7.8*  PHOS 7.8* 10.3* 8.5*   Liver Function Tests: Recent Labs  Lab 10/19/20 0048 10/20/20 0752 10/21/20 0016  ALBUMIN 2.4* 2.4* 2.4*   No results for input(s): LIPASE, AMYLASE in the last 168 hours. No results for input(s): AMMONIA in the last 168 hours. CBC: Recent Labs  Lab 10/18/20 0720 10/19/20 1126 10/20/20 0752 10/21/20 0016  WBC 21.2* 14.9* 11.2* 8.9  NEUTROABS  --  12.3* 8.7* 6.5  HGB 9.5* 9.0* 8.6* 8.4*  HCT 29.4* 27.5* 25.7* 25.2*  MCV 94.8 93.9 93.1 93.7  PLT  443* 504* 450* 462*   Cardiac Enzymes: No results for input(s): CKTOTAL, CKMB, CKMBINDEX, TROPONINI in the last 168 hours. CBG: Recent Labs  Lab 10/20/20 0811 10/20/20 1208 10/20/20 1556 10/20/20 2010 10/21/20 0608  GLUCAP 102* 189* 99 153* 111*    Iron Studies:  Recent Labs    10/19/20 1126  IRON 26*  TIBC 249*  FERRITIN 633*   Studies/Results: No results found.  Medications: Infusions: . sodium chloride Stopped (10/14/20 1029)  .   ceFAZolin (ANCEF) IV    . ferric gluconate (FERRLECIT/NULECIT) IV 250 mg (10/20/20 1302)    Scheduled Medications: . aspirin  81 mg Oral Daily  . atorvastatin  80 mg Oral Daily  . chlorhexidine  15 mL Mouth Rinse BID  . Chlorhexidine Gluconate Cloth  6 each Topical Q0600  . darbepoetin (ARANESP) injection - DIALYSIS  60 mcg Intravenous Q Wed-HD  . docusate sodium  200 mg Oral Daily  . enoxaparin (LOVENOX) injection  30 mg Subcutaneous Q24H  . feeding supplement (NEPRO CARB STEADY)  237 mL Oral BID BM  . guaiFENesin  10 mL Oral Q4H  . hydrALAZINE  25 mg Oral Q8H  . insulin aspart  0-15 Units Subcutaneous TID WC  . insulin aspart  0-5 Units Subcutaneous QHS  . isosorbide dinitrate  30 mg Oral BID  . mouth rinse  15 mL Mouth Rinse q12n4p  . metoprolol succinate  25 mg Oral Daily  . multivitamin with minerals  1 tablet Oral Daily  . senna-docusate  1 tablet Oral QHS  . sodium bicarbonate  1,300 mg Oral BID  . sodium chloride flush  3 mL Intravenous Q12H  . ticagrelor  90 mg Oral BID    have reviewed scheduled and prn medications.  Physical Exam: General:NAD, comfortable Heart:RRR, s1s2 nl, no rubs Lungs: Bibasal crackles, no increased work of breathing Abdomen:soft, Non-tender, non-distended Extremities:No edema Neurology: Alert awake and oriented  Alezandra Egli Tanna Furry 10/21/2020,9:41 AM  LOS: 17 days

## 2020-10-21 NOTE — Progress Notes (Addendum)
Inpatient Rehabilitation Admissions Coordinator  I met with patient at bedside with his wife. Cognitively much improved this week. I await medical workup completion before pursuing possible CIR admit. I need clarification if plans are for staged PCI to LAD prior to admit to CIR. Please advise.  I have asked his nurse to provide him with an Incentive spirometer for use and encouraged patient and wife to use as much as possible.  Danne Baxter, RN, MSN Rehab Admissions Coordinator [phone] 10/21/2020 1:47 PM

## 2020-10-21 NOTE — Progress Notes (Signed)
Inpatient Rehabilitation Admissions Coordinator  I continue to follow and await medical workup completion before pursuing admit to CIR.  Danne Baxter, RN, MSN Rehab Admissions Coordinator [phone] 10/21/2020 12:21 PM

## 2020-10-21 NOTE — Progress Notes (Incomplete)
Discussed visitation policy w/ wife; agreed that if pt's daughter comes in from [place] that switching our designated visitor is fine or if they preferred to have both of them for a short period of time, that would be approved based on hi

## 2020-10-21 NOTE — PMR Pre-admission (Signed)
PMR Admission Coordinator Pre-Admission Assessment  Patient: Timothy Moran is an 75 y.o., male MRN: [id number] DOB: Dec 25, 1945 Height: 6\' 1"  (185.4 cm) Weight: 93.4 kg  Insurance Information HMO:     PPO:      PCP:      IPA:      80/20:      OTHER:  PRIMARY: Medicare a and b      Policy#: [id number]      Subscriber: pt Benefits:  Phone #: passport one source     Name: 2/9 Eff. Date: a 03/12/2010 and b 11/11/2010     Deduct: $1556      Out of Pocket Max: none      Life Max: none CIR: 100%      SNF: 20 full days Outpatient: 80%     Co-Pay: 20% Home Health: 1005      Co-Pay: none DME: 80%     Co-Pay: 20% Providers: pt choice  SECONDARY: [employer]      Policy#: [id number]       Financial Counselor:       Phone#:   The "Data Collection Information Summary" for patients in Inpatient Rehabilitation Facilities with attached "Privacy Act [place] Records" was provided and verbally reviewed with: Family  Emergency Contact Information Contact Information    Name Relation Home Work [place], [place] [phone]        Current Medical History  Patient Admitting Diagnosis: Debility post cardiac arrest; anoxic brain injury  History of Present Illness: 75 year old male with history of LBBB, mildly reduced LVEF, 4.2 cm Thoracic aortic aneurysm, multiple symptomatic meningiomas, CKD stage III, two prior pulmonary embolisms, HTN and Pulmonary nodules. Presented on 10/04/2020 as a resuscitated VF arrest at home and was found to have a inferior ST elevation on ECG.  Taken emergently to the cath lab where he was found to have thrombotic occlusion of a large, dominant , distal circumflex. Treated with PCI to distal LCx. He does have residual obstructive disease of the mid-LAD with plans to consider staged PCI to LAD in the future pending renal function and symptomology.. No further episodes of VT/VF noted on telemetry. Still with ongoing chest discomfort which was felt  to be musculoskeletal given resuscitative efforts. To continue ASA and Brilinta, Statin, Beta Blocker, isosorbide dinitrate. LIFE VEST to be applied 2/17 prior to CIR admit.  Acute combined CHF/CM possibly ischemic. EF 20 to 25 % with diffuse hypokinesis, mild concentric LVH and mild MR. Began on lasix. Gradual increase in Creatinine with diuresis. Nephrology consulted for monitoring for possible need for hemodialysis for volume management given up trending Creatinine and ongoing pulmonary edema on CXR. Nephrology monitoring and avoiding nephrotoxic agents. Leukocytosis with patient coughing. No clear infection on CXR. Encouraged to use incentive spirometer.   Encephalopathy felt to be due to mild anoxic brain injury. Mental status continues to improve. He does have a mild tremor on bilateral upper extremities that was not present prior to this admission.   Patient's medical record from [facility] has been reviewed by the rehabilitation admission coordinator and physician.  Past Medical History  Past Medical History:  Diagnosis Date  . Abnormal echocardiogram 06/07/2019  . Acute on chronic combined systolic and diastolic HF (heart failure) ([place]) 10/26/2020  . Anoxic brain injury ([place]) 10/26/2020  . Arrhythmia   . Ascending aortic aneurysm (HCC) 4.2 cm based on CT from December 2020 01/29/2018   4.3 cm by echo in 2019  .  Benign brain tumor (HCC)   . Benign neoplasm of brain (HCC) 12/19/2017  . CAD in native artery residual disease of LAD, may need PCI, treating medically for now 10/26/2020  . Chest pain 12/04/2017  . Chronic kidney disease    Stage 3  . Chronic kidney disease, stage III (moderate) (HCC) 12/19/2017  . Diabetes mellitus, type 2 (HCC) 10/06/2020   10/06/20 A1C 7%  . Dizziness 10/11/2019  . Hematuria 10/26/2020  . History of pulmonary embolus (PE)   . Hypertension   . Iron deficiency anemia rec'd IV iron 10/26/2020  . Ischemic cardiomyopathy 10/26/2020  . Left bundle branch  block 12/19/2017  . Nonsustained ventricular tachycardia (HCC) 10/11/2019  . Pain of left hip joint 05/29/2020  . Personal history of pulmonary embolism 12/19/2017  . Pulmonary hypertension due to thromboembolism (HCC) 03/09/2018   See echo 12/27/17 with nl PAS vs CTa chest 02/23/18   . S/P angioplasty with stent 10/04/20 DES to LCX  10/26/2020  . Sinus bradycardia 12/22/2017  . Solitary pulmonary nodule on lung CT 03/08/2018   CT 12/04/17 1.0 x 0.8 x 0.7 cm nodular opacity in the RUL vs not seen 03/16/10 posterior segment of the right upper lobe. Marland KitchenSpirometry 03/08/2018    FEV1 3.86 (108%)  Ratio 96 s prior rx  - PET  03/13/18   Low grade c/w adenoca > rec T surgery eval    . Urinary retention 10/26/2020    Family History   family history includes Brain cancer in his sister; Diabetes in his mother; Leukemia in his brother; Melanoma in his brother.  Prior Rehab/Hospitalizations Has the patient had prior rehab or hospitalizations prior to admission? Yes  Has the patient had major surgery during 100 days prior to admission? Yes   Current Medications  Current Facility-Administered Medications:  .  0.9 %  sodium chloride infusion, 250 mL, Intravenous, PRN, Bhagat, Bhavinkumar, PA, Stopped at 10/14/20 1029 .  acetaminophen (TYLENOL) tablet 650 mg, 650 mg, Oral, Q6H PRN, Hammons, Kimberly B, RPH, 650 mg at 10/26/20 2325 .  albuterol (PROVENTIL) (2.5 MG/3ML) 0.083% nebulizer solution 2.5 mg, 2.5 mg, Nebulization, Q6H PRN, Bhagat, Bhavinkumar, PA, 2.5 mg at 10/16/20 1740 .  aspirin chewable tablet 81 mg, 81 mg, Oral, Daily, Hammons, Kimberly B, RPH, 81 mg at 10/29/20 0947 .  atorvastatin (LIPITOR) tablet 80 mg, 80 mg, Oral, Daily, Hammons, Kimberly B, RPH, 80 mg at 10/29/20 0945 .  chlorhexidine (PERIDEX) 0.12 % solution 15 mL, 15 mL, Mouth Rinse, BID, Bhagat, Bhavinkumar, PA, 15 mL at 10/28/20 0801 .  Chlorhexidine Gluconate Cloth 2 % PADS 6 each, 6 each, Topical, Daily, Parke Poisson, MD, 6 each at 10/28/20  0801 .  diphenhydrAMINE (BENADRYL) capsule 50 mg, 50 mg, Oral, QHS PRN, Leander Rams, RPH, 50 mg at 10/22/20 2248 .  docusate sodium (COLACE) capsule 200 mg, 200 mg, Oral, Daily, Nada Boozer R, NP, 200 mg at 10/29/20 0944 .  guaiFENesin (ROBITUSSIN) 100 MG/5ML solution 200 mg, 10 mL, Oral, Q4H, Hammons, Kimberly B, RPH, 200 mg at 10/27/20 2026 .  haloperidol lactate (HALDOL) injection 2 mg, 2 mg, Intravenous, Q6H PRN, Lonie Peak, MD, 2 mg at 10/16/20 0111 .  hydrALAZINE (APRESOLINE) tablet 25 mg, 25 mg, Oral, Q8H, Lars Masson, MD, 25 mg at 10/28/20 2202 .  insulin aspart (novoLOG) injection 0-5 Units, 0-5 Units, Subcutaneous, QHS, Elgergawy, Dawood S, MD .  insulin aspart (novoLOG) injection 0-9 Units, 0-9 Units, Subcutaneous, TID WC, Elgergawy, Leana Roe, MD, 2  Units at 10/29/20 1227 .  isosorbide dinitrate (ISORDIL) tablet 30 mg, 30 mg, Oral, BID, Hammons, Kimberly B, RPH, 30 mg at 10/29/20 0946 .  living well with diabetes book MISC, , Does not apply, Once, Elouise Munroe, MD .  LORazepam (ATIVAN) injection 1 mg, 1 mg, Intravenous, Q4H PRN, Bhagat, Bhavinkumar, PA, 1 mg at 10/21/20 2310 .  MEDLINE mouth rinse, 15 mL, Mouth Rinse, q12n4p, Bhagat, Bhavinkumar, PA, 15 mL at 10/24/20 1213 .  melatonin tablet 3 mg, 3 mg, Oral, QHS PRN, Hammons, Kimberly B, RPH, 3 mg at 10/27/20 2329 .  metoprolol succinate (TOPROL-XL) 24 hr tablet 50 mg, 50 mg, Oral, Daily, Minus Breeding, MD, 50 mg at 10/29/20 0946 .  morphine 2 MG/ML injection 2 mg, 2 mg, Intravenous, Q4H PRN, Bhagat, Bhavinkumar, PA, 2 mg at 10/19/20 1848 .  multivitamin with minerals tablet 1 tablet, 1 tablet, Oral, Daily, Dorothy Spark, MD, 1 tablet at 10/29/20 0945 .  oxyCODONE (Oxy IR/ROXICODONE) immediate release tablet 5 mg, 5 mg, Oral, Q6H PRN, Hammons, Kimberly B, RPH, 5 mg at 10/21/20 2311 .  senna-docusate (Senokot-S) tablet 1 tablet, 1 tablet, Oral, QHS, Hammons, Theone Murdoch, RPH, 1 tablet at 10/27/20 2028 .   simethicone (MYLICON) chewable tablet 80 mg, 80 mg, Oral, Q6H PRN, Doran Clay, MD, 80 mg at 10/21/20 2311 .  sodium bicarbonate tablet 1,300 mg, 1,300 mg, Oral, TID, Rosita Fire, MD, 1,300 mg at 10/29/20 0943 .  sodium chloride flush (NS) 0.9 % injection 3 mL, 3 mL, Intravenous, Q12H, Bhagat, Bhavinkumar, PA, 3 mL at 10/29/20 1022 .  sodium chloride flush (NS) 0.9 % injection 3 mL, 3 mL, Intravenous, PRN, Bhagat, Bhavinkumar, PA .  tamsulosin (FLOMAX) capsule 0.4 mg, 0.4 mg, Oral, QPC supper, Cecilie Kicks R, NP, 0.4 mg at 10/28/20 1805 .  ticagrelor (BRILINTA) tablet 90 mg, 90 mg, Oral, BID, Hammons, Kimberly B, RPH, 90 mg at 10/29/20 0954 .  white petrolatum (VASELINE) gel, , Topical, PRN, Bhagat, Bhavinkumar, PA, 0.2 application at 85/46/27 0110  Patients Current Diet:  Diet Order            Diet Carb Modified Fluid consistency: Thin; Room service appropriate? Yes; Fluid restriction: Other (see comments)  Diet effective now                 Precautions / Restrictions Precautions Precautions: Fall Precaution Comments: impulsive; watch RR/O2 Restrictions Weight Bearing Restrictions: No   Has the patient had 2 or more falls or a fall with injury in the past year? No  Prior Activity Level Community (5-7x/wk): Independ  Prior Functional Level Self Care: Did the patient need help bathing, dressing, using the toilet or eating? Independent  Indoor Mobility: Did the patient need assistance with walking from room to room (with or without device)? Independent  Stairs: Did the patient need assistance with internal or external stairs (with or without device)? Independent  Functional Cognition: Did the patient need help planning regular tasks such as shopping or remembering to take medications? Independent  Home Assistive Devices / Equipment Home Assistive Devices/Equipment: None Home Equipment: Cane - single point,Grab bars - tub/shower  Prior Device Use: Indicate  devices/aids used by the patient prior to current illness, exacerbation or injury? None of the above  Current Functional Level Cognition  Overall Cognitive Status: Impaired/Different from baseline Current Attention Level: Selective Orientation Level: Oriented to person,Oriented to place Following Commands: Follows one step commands consistently,Follows one step commands with increased time,Follows multi-step commands  inconsistently Safety/Judgement: Decreased awareness of deficits,Decreased awareness of safety General Comments: Pt continues to have some cognitive disfuction in areas of memory, following multistep commands and insight.    Extremity Assessment (includes Sensation/Coordination)  Upper Extremity Assessment: Overall WFL for tasks assessed  Lower Extremity Assessment: Defer to PT evaluation    ADLs  Overall ADL's : Needs assistance/impaired Eating/Feeding: Set up,Sitting Grooming: Wash/dry hands,Wash/dry face,Oral care,Set up,Sitting Grooming Details (indicate cue type and reason): attempted to stand.  Pt became very dizzy requesting to sit.  Pt sat for appx 5 minutes and then was able to finish in standing. Lower Body Bathing: Supervison/ safety,Sitting/lateral leans,Set up Lower Body Bathing Details (indicate cue type and reason): simulated via psoterior pericare from toilet via lateral leans Upper Body Dressing : Minimal assistance,Sitting Lower Body Dressing: Minimal assistance,Sitting/lateral leans Lower Body Dressing Details (indicate cue type and reason): Pt donned socks without assist but does require assist in standing to pull pants up and maintain balance. Pt gets very dizzy upon first standing and requires cues to take his time before walking when he first stands. Toilet Transfer: Minimal assistance,RW,Ambulation,Regular Toilet,Grab bars Armed forces technical officer Details (indicate cue type and reason): MIN A for balance and safety with mobility as pt reports dizziness with  mobility Toileting- Clothing Manipulation and Hygiene: Supervision/safety,Set up,Sitting/lateral lean Toileting - Clothing Manipulation Details (indicate cue type and reason): pt able to complete posterior pericare from sitting on toilet with set-up of wipes via lateral leans Functional mobility during ADLs: Minimal assistance,Rolling walker General ADL Comments: Pt continues to be very weak, requiring many rest breaks but is motivated to do more each session. Pt requires cues to remember not to get up to quickly and to sit when he becomes dizzy.    Mobility  Overal bed mobility: Needs Assistance Bed Mobility: Sit to Supine Supine to sit: HOB elevated,Supervision Sit to supine: Min guard,HOB elevated General bed mobility comments: Pt requires supervision because he continues to get dizzy with changes of position.    Transfers  Overall transfer level: Needs assistance Equipment used: Rolling walker (2 wheeled) Transfers: Sit to/from Stand Sit to Stand: Min assist Stand pivot transfers: Min assist General transfer comment: Pt required min assist today due to impulsivity and dizziness.  Pt quickly becomes dizzy.  Helpful to have a chair nearby.    Ambulation / Gait / Stairs / Wheelchair Mobility  Ambulation/Gait Ambulation/Gait assistance: Herbalist (Feet): 140 Feet (30 feet, 40 feet then 70 feet) Assistive device: Rolling walker (2 wheeled) Gait Pattern/deviations: Step-through pattern,Decreased stride length,Drifts right/left,Wide base of support,Leaning posteriorly General Gait Details: Overall steady with RW.  Had to take several standing rest breaks due to fatigue and dizziness per pt. Wife followed with chair so that pt would progress distance. Gait velocity: reduced Gait velocity interpretation: <1.31 ft/sec, indicative of household ambulator    Posture / Balance Dynamic Sitting Balance Sitting balance - Comments: Prefers UE support at EOB, supervision for  safety. Balance Overall balance assessment: Needs assistance Sitting-balance support: Bilateral upper extremity supported,Feet supported Sitting balance-Leahy Scale: Fair Sitting balance - Comments: Prefers UE support at EOB, supervision for safety. Postural control: Posterior lean Standing balance support: Bilateral upper extremity supported,During functional activity Standing balance-Leahy Scale: Fair Standing balance comment: minguard for safety to complete ADLS at sink    Special needs/care consideration Hgb A1c 7; new dx this admission Designated visitor is wife, Delcie Roch Staged PCI pending as an outpatient 16 FR indwelling catheter placed on 2/14 LIFE VEST to be applied  2/17 prior to CIR admit   Previous Home Environment  Living Arrangements: Spouse/significant other  Lives With: Spouse Available Help at Discharge: Family,Available 24 hours/day Type of Home: House Home Layout: Two level,Able to live on main level with bedroom/bathroom Alternate Level Stairs-Number of Steps: full flight Home Access: Stairs to enter Entrance Stairs-Rails: Right Entrance Stairs-Number of Steps: 6 Bathroom Shower/Tub: Health visitor: Standard Bathroom Accessibility: Yes How Accessible: Accessible via walker Home Care Services: No  Discharge Living Setting Plans for Discharge Living Setting: Patient's home,Lives with (comment) (wife) Type of Home at Discharge: House Discharge Home Layout: Two level,Able to live on main level with bedroom/bathroom Alternate Level Stairs-Number of Steps: full flight Discharge Home Access: Stairs to enter Entrance Stairs-Rails: Right Entrance Stairs-Number of Steps: 6 Discharge Bathroom Shower/Tub: Walk-in shower Discharge Bathroom Toilet: Standard Discharge Bathroom Accessibility: Yes How Accessible: Accessible via walker Does the patient have any problems obtaining your medications?: No  Social/Family/Support Systems Patient Roles:  Spouse Contact Information: wife, Marlana Latus Anticipated Caregiver: wife Anticipated Industrial/product designer Information: as above Ability/Limitations of Caregiver: no limitations Caregiver Availability: 24/7 Discharge Plan Discussed with Primary Caregiver: Yes Is Caregiver In Agreement with Plan?: Yes Does Caregiver/Family have Issues with Lodging/Transportation while Pt is in Rehab?: No  Goals Patient/Family Goal for Rehab: Mod I to supervision with PT, OT, and SLP Expected length of stay: ELOS 10 to 14 days Pt/Family Agrees to Admission and willing to participate: Yes Program Orientation Provided & Reviewed with Pt/Caregiver Including Roles  & Responsibilities: Yes  Decrease burden of Care through IP rehab admission: n/a  Possible need for SNF placement upon discharge: not anticipated  Patient Condition: I have reviewed medical records from [facility], spoken with CM, and patient and spouse. I met with patient at the bedside for inpatient rehabilitation assessment.  Patient will benefit from ongoing PT, OT and SLP, can actively participate in 3 hours of therapy a day 5 days of the week, and can make measurable gains during the admission.  Patient will also benefit from the coordinated team approach during an Inpatient Acute Rehabilitation admission.  The patient will receive intensive therapy as well as Rehabilitation physician, nursing, social worker, and care management interventions.  Due to bladder management, bowel management, safety, skin/wound care, disease management, medication administration, pain management and patient education the patient requires 24 hour a day rehabilitation nursing.  The patient is currently min assist overall with mobility and basic ADLs.  Discharge setting and therapy post discharge at home with home health is anticipated.  Patient has agreed to participate in the Acute Inpatient Rehabilitation Program and will admit today.  Preadmission Screen Completed  By:  Clois Dupes, 10/29/2020 12:41 PM ______________________________________________________________________   Discussed status with Dr. Berline Chough  on  10/29/2020 at  1242 and received approval for admission today.  Admission Coordinator:  Clois Dupes, RN, time  4847 Date  10/29/2020   Assessment/Plan: Diagnosis: 1. Does the need for close, 24 hr/day Medical supervision in concert with the patient's rehab needs make it unreasonable for this patient to be served in a less intensive setting? Yes 2. Co-Morbidities requiring supervision/potential complications: HTN, VF arrest s/p Lifevest before coming; CKD 3 with AKI Cr ~ 3.0, pulm modules, menigiomas with dizziness, Pes, EF 20-25%, anoxic brain injury 3. Due to bladder management, bowel management, safety, skin/wound care, disease management, medication administration, pain management and patient education, does the patient require 24 hr/day rehab nursing? Yes 4. Does the patient require coordinated care of  a physician, rehab nurse, PT, OT, and SLP to address physical and functional deficits in the context of the above medical diagnosis(es)? Yes Addressing deficits in the following areas: balance, endurance, locomotion, strength, transferring, bathing, dressing, feeding, grooming, toileting, cognition, speech and language 5. Can the patient actively participate in an intensive therapy program of at least 3 hrs of therapy 5 days a week? Yes 6. The potential for patient to make measurable gains while on inpatient rehab is fair 7. Anticipated functional outcomes upon discharge from inpatient rehab: modified independent and supervision PT, modified independent and supervision OT, modified independent and supervision SLP 8. Estimated rehab length of stay to reach the above functional goals is: 10-14 days 9. Anticipated discharge destination: Home 10. Overall Rehab/Functional Prognosis: fair   MD Signature:

## 2020-10-21 NOTE — Progress Notes (Signed)
Physical Therapy Treatment Patient Details Name: Timothy Moran MRN: [id number] DOB: [birth year] Today's Date: 10/21/2020    History of Present Illness Pt is 75 year old male with a history of LBBB, mildly reduced LVEF, 4.2cm TAA, multiple symptomatic meningiomas (possible NF2 suspected), CKD stage III, two prior pulmonary embolisms (now off Eliquis), HTN, and pulmonary nodules, who presented as a resuscitated VF arrest at home and was found to have inferior ST elevation on ECG. He began having chest pain while shoveling snow this afternoon. He collapsed and lost consciousness with no obvious injuries in the fall. No bystander CPR performed. He was found with agonal breathing and an AED-shockable rhythm. He received a total of 4 defibrillations by fire and EMS.  Patient is more alert, but remains confused with decreased awareness and safety.  Pt s/p cardiac cath with stent on 1/23 and was intubated until 1/25. Plan for HD on 2/8 due to elevated Cr.    PT Comments    Pt received seated up on [facility] in room with NT present, agreeable to therapy session and with good participation as able. Pt continues to report discomfort due to chest soreness from CPR and feeling "heaviness" with breathing, reviewed with pt slow pursed-lip breathing techniques to improve respiratory rate however HR/SpO2 WNL (RR varying 25-40 rpm during mobility). Due to tachypnea and pt fatigued after hygiene task at sink prior to mobility, pt performed gait trial ~88ft using RW and chair follow, needing up to [facility] for balance. Pt agreeable to sit up in chair at end of session as OT planning to come in shortly. Pt continues to benefit from PT services to progress toward functional mobility goals. Continue to recommend CIR.   Follow Up Recommendations  CIR     Equipment Recommendations  Rolling walker with 5" wheels;Wheelchair (measurements PT);Wheelchair cushion (measurements PT)    Recommendations for Other Services        Precautions / Restrictions Precautions Precautions: Fall Precaution Comments: impulsive; watch RR Restrictions Weight Bearing Restrictions: No    Mobility  Bed Mobility               General bed mobility comments: pt remained up in chair at end of session  Transfers Overall transfer level: Needs assistance Equipment used: Rolling walker (2 wheeled) Transfers: Sit to/from Stand Sit to Stand: Min assist         General transfer comment: Sit to stand 1x from [facility], EOB and from chair, cues not to pull up on RW. MinA and extra time to power up to stand, needs min/modA upon initially standing due to LOB but corrects with RW and external assist  Ambulation/Gait Ambulation/Gait assistance: Mod assist;Min assist Gait Distance (Feet): 50 Feet Assistive device: Rolling walker (2 wheeled) Gait Pattern/deviations: Step-through pattern;Decreased stride length;Drifts right/left;Wide base of support;Leaning posteriorly (stiff gait pattern) Gait velocity: reduced   General Gait Details: Pt impulsive reaching off BOS on occasion and lifting RW needing reminders to keep hands on RW and RW on floor. Intermittent LOB posteriorly or laterally with min-modA to recover. mostly minA for forward stepping and modA when turning and side-stepping. Difficulty managing in tight spaces. Chair follow for safety. Multiple standing breaks due to elevated RR ~28-35 rpm.   Stairs             Wheelchair Mobility    Modified Rankin (Stroke Patients Only)       Balance Overall balance assessment: Needs assistance Sitting-balance support: Bilateral upper extremity supported;Feet supported Sitting balance-Leahy Scale: Poor Sitting  balance - Comments: Prefers UE support at EOB, supervision for safety more due to restlessness/cognition than poor balance Postural control: Posterior lean Standing balance support: Bilateral upper extremity supported;Single extremity supported Standing balance-Leahy  Scale: Poor Standing balance comment: Reliant on UEs on RW and external assist  minA for static standing at RW but up to [place] with multiple LOB                            Cognition Arousal/Alertness: Awake/alert Behavior During Therapy: Impulsive Overall Cognitive Status: Impaired/Different from baseline Area of Impairment: Attention;Safety/judgement;Awareness;Problem solving;Memory;Orientation                   Current Attention Level: Sustained Memory: Decreased short-term memory Following Commands: Follows one step commands consistently;Follows one step commands with increased time;Follows multi-step commands inconsistently Safety/Judgement: Decreased awareness of deficits;Decreased awareness of safety Awareness: Emergent Problem Solving: Requires verbal cues;Difficulty sequencing General Comments: pt with continued word finding difficulty at time and increased processing time/decreased safety and insight but good participation. pt wanting to know now timeline for when he can go home.      Exercises General Exercises - Lower Extremity Ankle Circles/Pumps: AROM;Both;10 reps;Supine Hip Flexion/Marching: AROM;10 reps;Both;Seated    General Comments        Pertinent Vitals/Pain Pain Assessment: No/denies pain Faces Pain Scale: Hurts a little bit Pain Location: Soreness in chest from CPR Pain Descriptors / Indicators: Discomfort;Sore Pain Intervention(s): Monitored during session;Repositioned    Home Living                      Prior Function            PT Goals (current goals can now be found in the care plan section) Acute Rehab PT Goals Patient Stated Goal: to get stronger PT Goal Formulation: With patient/family Time For Goal Achievement: 10/24/20 Potential to Achieve Goals: Good Progress towards PT goals: Progressing toward goals    Frequency    Min 3X/week      PT Plan Current plan remains appropriate    Co-evaluation               AM-PAC PT "6 Clicks" Mobility   Outcome Measure  Help needed turning from your back to your side while in a flat bed without using bedrails?: A Little Help needed moving from lying on your back to sitting on the side of a flat bed without using bedrails?: A Little Help needed moving to and from a bed to a chair (including a wheelchair)?: A Little Help needed standing up from a chair using your arms (e.g., wheelchair or bedside chair)?: A Little Help needed to walk in hospital room?: A Lot Help needed climbing 3-5 steps with a railing? : Total 6 Click Score: 15    End of Session Equipment Utilized During Treatment: Gait belt Activity Tolerance: Patient tolerated treatment well;Other (comment) (fatigued after hygiene tasks sitting at sink but good participation as able) Patient left: with call bell/phone within reach;with family/visitor present;in chair;with chair alarm set Nurse Communication: Mobility status PT Visit Diagnosis: Unsteadiness on feet (R26.81);Other abnormalities of gait and mobility (R26.89);Muscle weakness (generalized) (M62.81);Difficulty in walking, not elsewhere classified (R26.2)     Time: 2694-8546 PT Time Calculation (min) (ACUTE ONLY): 16 min  Charges:  $Gait Training: 8-22 mins                     Mazal Ebey P., PTA  Acute Rehabilitation Services Pager: [phone] Office: [place] 10/21/2020, 3:49 PM

## 2020-10-21 NOTE — Progress Notes (Addendum)
Progress Note  Patient Name: Timothy Moran Date of Encounter: 10/21/2020  [facility] Cardiologist: Jenne Campus, MD    Subjective   Alert and oriented, feeling better though still with SOB with talking or much movement.    Inpatient Medications    Scheduled Meds: . aspirin  81 mg Oral Daily  . atorvastatin  80 mg Oral Daily  . chlorhexidine  15 mL Mouth Rinse BID  . Chlorhexidine Gluconate Cloth  6 each Topical Q0600  . darbepoetin (ARANESP) injection - DIALYSIS  60 mcg Intravenous Q Wed-HD  . docusate sodium  200 mg Oral Daily  . enoxaparin (LOVENOX) injection  30 mg Subcutaneous Q24H  . feeding supplement (NEPRO CARB STEADY)  237 mL Oral BID BM  . guaiFENesin  10 mL Oral Q4H  . hydrALAZINE  25 mg Oral Q8H  . insulin aspart  0-15 Units Subcutaneous TID WC  . insulin aspart  0-5 Units Subcutaneous QHS  . isosorbide dinitrate  30 mg Oral BID  . mouth rinse  15 mL Mouth Rinse q12n4p  . metoprolol succinate  25 mg Oral Daily  . multivitamin with minerals  1 tablet Oral Daily  . senna-docusate  1 tablet Oral QHS  . sodium bicarbonate  1,300 mg Oral BID  . sodium chloride flush  3 mL Intravenous Q12H  . ticagrelor  90 mg Oral BID   Continuous Infusions: . sodium chloride Stopped (10/14/20 1029)  .  ceFAZolin (ANCEF) IV    . ferric gluconate (FERRLECIT/NULECIT) IV 250 mg (10/20/20 1302)   PRN Meds: sodium chloride, acetaminophen, albuterol, diphenhydrAMINE, haloperidol lactate, LORazepam, melatonin, morphine injection, oxyCODONE, simethicone, sodium chloride flush, white petrolatum   Vital Signs    Vitals:   10/20/20 1936 10/21/20 0005 10/21/20 0329 10/21/20 0459  BP: 115/68 114/67 108/63   Pulse: 80 75 75   Resp: 20 20 18    Temp: 98 F (36.7 C) 98 F (36.7 C) 98.4 F (36.9 C)   TempSrc: Oral Oral Oral   SpO2: 92% 92% 95%   Weight:    96.8 kg  Height:        Intake/Output Summary (Last 24 hours) at 10/21/2020 0826 Last data filed at 10/21/2020  0500 Gross per 24 hour  Intake 243.22 ml  Output 1480 ml  Net -1236.78 ml   Last 3 Weights 10/21/2020 10/20/2020 10/19/2020  Weight (lbs) 213 lb 4.8 oz 221 lb 8 oz 221 lb 1.9 oz  Weight (kg) 96.752 kg 100.472 kg 100.3 kg      Telemetry    SR - Personally Reviewed  ECG    No new - Personally Reviewed  Physical Exam   GEN: No acute distress.   Neck: No JVD Cardiac: RRR, no murmurs, rubs, or gallops.  Respiratory: diminished in bases to auscultation bilaterally. GI: Soft, nontender, non-distended  MS: No edema; No deformity. Neuro:  Nonfocal  Psych: Normal affect   Labs    High Sensitivity Troponin:   Recent Labs  Lab 10/04/20 1808 10/04/20 2324 10/05/20 0500  TROPONINIHS 75* >27,000* >27,000*      Chemistry Recent Labs  Lab 10/19/20 0048 10/20/20 0752 10/21/20 0016  NA 141 140 144  K 4.7 4.6 4.2  CL 110 108 111  CO2 14* 13* 15*  GLUCOSE 125* 110* 110*  BUN 96* 121* 118*  CREATININE 6.24* 8.02* 7.24*  CALCIUM 8.2* 7.9* 7.8*  ALBUMIN 2.4* 2.4* 2.4*  GFRNONAA 9* 6* 7*  ANIONGAP 17* 19* 18*     Hematology  Recent Labs  Lab 10/19/20 1126 10/20/20 0752 10/21/20 0016  WBC 14.9* 11.2* 8.9  RBC 2.93* 2.76* 2.69*  HGB 9.0* 8.6* 8.4*  HCT 27.5* 25.7* 25.2*  MCV 93.9 93.1 93.7  MCH 30.7 31.2 31.2  MCHC 32.7 33.5 33.3  RDW 14.4 14.4 14.3  PLT 504* 450* 462*    BNPNo results for input(s): BNP, PROBNP in the last 168 hours.   DDimer No results for input(s): DDIMER in the last 168 hours.   Radiology    No results found.  Cardiac Studies   Cath 10/04/20 Conclusions: 1. Significant two-vessel coronary artery disease including thrombotic occlusion of the distal LCx, which is the culprit for the patient'sinferior STEMIas well as sequential 50% and 80% proximal/mid LAD lesions. 2. Mildly elevated left ventricular filling pressure (LVEDP 20-25 mmHg). 3. SuccessfulPCI to distal LCx using Resolute Onyx 2.5 x 30 mm drug-eluting stent complicated by no reflow  treated with intracoronary adenosine and nitroglycerin, as well as aggressive antiplatelet therapy. Final angiogram shows 0% residual stenosis with TIMI-2 flow.  Recommendations: 1. Admit to 2H-ICU for post STEMI/cardiac arrestmonitoring. Consider targeted temperature management; critical care medicine has been consulted to assist with targeted temperature and ventilator management. 2. Continue tirofiban infusion for 6 hours. 3. Loaded with ticagrelor when patient arrives in ICU; patient should complete 12 months of dual antiplatelet therapy with aspirin and ticagrelor. 4. Obtain echocardiogram. 5. Aggressive secondary prevention of coronary artery disease including high intensity statin therapy. 6. Continue IV amiodarone overnight. 7. Consider staged PCI to LAD.  Echocardiogram 10/05/20 1. Since the study on 06/19/2020 LVEF has decreased, now severely impaired  at 20-25% and diffuse hypokinesis.  2. Left ventricular ejection fraction, by estimation, is 20 to 25%. The  left ventricle has severely decreased function. The left ventricle  demonstrates global hypokinesis. There is mild concentric left ventricular  hypertrophy. Left ventricular diastolic  parameters are consistent with Grade I diastolic dysfunction (impaired  relaxation).  3. Right ventricular systolic function is normal. The right ventricular  size is normal.  4. The mitral valve is normal in structure. Mild mitral valve  regurgitation. No evidence of mitral stenosis.  5. The aortic valve is normal in structure. Aortic valve regurgitation is  trivial. No aortic stenosis is present.  6. The inferior vena cava is normal in size with greater than 50%  respiratory variability, suggesting right atrial pressure of 3 mmHg.    Patient Profile     75 y.o. male with a PMH of LBBB, mildly reduced LVEF, thoracic aortic aneurysm (4.2cm), HTN, multiple symptomatic meningiomas (possible NF2 suspected), 2 prior PE's no longer on  anticoagulation, and CKD stage 3, who presented with out of hospital VF arrest with successful resuscitation with post resuscitation EKG revealing inferior STEMI.  Assessment & Plan    1. Inferolateral STEMI precipitated by VF arrest:patient had out of hospital VF arrest with successful ROSC after 16 minutes of ACLS including multiple defibrillations. Post ROSC EKG with interolateral STE. He was taken emergently to the cath lab 10/04/20 where he was found to have significatn 2-vessel CAD with PCI/DES to LCx, started on aspirin and brilinta, with plans to consider staged PCI to LAD in the future. HsTrop peaked at >27000. He was maintained on IV amiodarone initially, though this was discontinued after revascularization. No further episodes of VF/VT noted on telemetry. Still with some ongoing chest discomfort which was felt to be MSK given resuscitative efforts - Continue aspirin and brilinta - Continue statin - Continue BBlocker -  Continue isosorbide dinitrate.  - Plans for staged intervention will be delayed pending recovery of renal function and symptomology -  - PT saw yesterday and once medically ready to go to CIR.   2. Acute combined CHF/CM possible ischemic:Echo this admission showed EF 20-25%, with diffuse hypokinesis, mild concentric LVH, G1DD, and mild MR. He was started on IV lasix 40mg  BID yesterday given increased weight and SOB. UOP is inaccurate with several unmeasured UOP occurrences. Weight is down to 221lbs today from 225lbs yesterday with peak weight 240lbs this admission. GDMT has been limited by AoCKD this admission - Cr 1.8 on admission, then up to 2.97 initially with some improvement, however now 3.89>5.02>6.24 >8.02 over the past 4 days.  - held lasix given continued uptrending of Cr Nephrology managing  - May need HD plan had been for today but slight improvement in CR for volume management given uptrending Cr and ongoing pulmonary edema on CXR --  If no HD will need to add  diuretic back, will defer to nephrology.  - Continue metoprolol succinate, hydralazine, and isordil  3. AKI/CKD stage 3:Cr 1.8 on admission, then up to 2.97 with some initial improvement, however over the past 3 days has risen significantly 3.89>5.02>6.24>8.02>7.24.. Nephrology following. Urine studies suggestive of intrinsic etiology. Renal ultrasound was unremarkable. Started on sodium bicarb BID today given mild metabolic acidosis with anion gap 17 on labs today. Holding diuretics. May need to initiate HD today if Cr continues to worsen - this was discussed with the patient/wife today.  Nephrology has seen and IR to place line catheter  - wife agreeable to dialyisis - this is on hold with slight decline in Cr.  - Appreciate nephrology's assistance - Will continue to monitor Cr closely and avoid nephrotoxic agents  4. Leukocytosis:WBC uptrended to 21.2 now at 11.2  He has been coughing, though no significant change in the past several days.No clear infection on CXR yesterday and he remains afebrile.No complaints of dysuria, hematuria, chills/sweats. - Low threshold to pan culture if fever, change in symptoms, or significant rise in WBC on repeat labs today - Encouraged incentive spirometer use - order placed.  5. Anemia:Hgb was 13 on admission, down to 9.2 in the setting of an oozing central line. Hgb increased to 11.2, then back down to 9.5 now 8.4, though generally stable over the past few days. No evidence of bleeding on DAPT. - iron studies with iron 26, iron sat 10, ferritin 633 Nephrology beginning IV iron and ESA.   6. DM type 2:Hgb A1C 7.0 this admission. This is a new diagnosis. On ISS.  - Will place consult to DM coordinator as previously recommended.   7. History of PE's:most recently noted to have PE 08/03/20 and recommended for 3 month course of eliquis, however patient self discontinued this at home. Anticoagulation not restarted this admission  8.  Encephalopathy:felt to be 2/2 mild anoxic brain injury. Mental status continues to improve. He does have a mild tremor on bilateral upper extremities that was not present prior to his MI. Query whether this is related.  Better today, ate BK.   - Will discuss any additional work-up with Dr. Percival Spanish  9. Constipation:resolved, now having multiple BMs per day.  - Will de-escalate bowel regimen  For questions or updates, please contact [place] Please consult [email or web address] for contact info under      Signed, Cecilie Kicks, NP  10/21/2020, 8:26 AM    History and all data above reviewed.  Patient examined.  I agree with the findings as above.    Doing much better today.  SOB with activity but making urine now.  Creat is starting to come down.  The patient exam reveals COR:RRR  ,  Lungs: Right basilar crackes,  Abd: Positive bowel sounds, no rebound no guarding, Ext No edema   .  All available labs, radiology testing, previous records reviewed. Agree with documented assessment and plan.   Ischemic cardiomyopathy:  Now starting to make urine.  Hopefully he will not need dialysis.  If he continues to make this kind of progress he would easily be ready for rehab by early next week or the weekend if possible.  BP is low so no med titration.  Will record O2 sats with ambulation on RA.    Jeneen Rinks Cyrilla Durkin  12:34 PM  10/21/2020

## 2020-10-21 NOTE — Progress Notes (Signed)
Attempted to send for pt for procedure. Per RN, Wannetta Sender,  pt is producing more urine and labs have improved so MD would like to wait another day to see if renal function continues to improve and dialysis catheter may not be needed.  IR aware. Rad PA aware.

## 2020-10-21 NOTE — Progress Notes (Signed)
Occupational Therapy Treatment Patient Details Name: Timothy Moran MRN: [id number] DOB: August 11, 1946 Today's Date: 10/21/2020    History of present illness Pt is 75 year old male with a history of LBBB, mildly reduced LVEF, 4.2cm TAA, multiple symptomatic meningiomas (possible NF2 suspected), CKD stage III, two prior pulmonary embolisms (now off Eliquis), HTN, and pulmonary nodules, who presented as a resuscitated VF arrest at home and was found to have inferior ST elevation on ECG. He began having chest pain while shoveling snow this afternoon. He collapsed and lost consciousness with no obvious injuries in the fall. No bystander CPR performed. He was found with agonal breathing and an AED-shockable rhythm. He received a total of 4 defibrillations by fire and EMS.  Patient is more alert, but remains confused with decreased awareness and safety.  Pt s/p cardiac cath with stent on 1/23 and was intubated until 1/25. Plan for HD on 2/8 due to elevated Cr.   OT comments  Pt making steady progress towards OT goals this session. Pt reports fatigue from earlier PT session declining out of chair mobility, session focus on seated therex to increase strength and AROM for higher level BADLs. Pt completed therex as indicated below but reporting fatigue with RR increasing to 33. Pt request to return to bed with pt needing MINA +2 for stand pivot back to bed with RW. Pt continues to present with decreased activity tolerance and generalized weakness. Pt would continue to benefit from skilled occupational therapy while admitted and after d/c to address the below listed limitations in order to improve overall functional mobility and facilitate independence with BADL participation. DC plan remains appropriate, will follow acutely per POC.     Follow Up Recommendations  CIR;Supervision/Assistance - 24 hour    Equipment Recommendations  Tub/shower seat    Recommendations for Other Services      Precautions /  Restrictions Precautions Precautions: Fall Precaution Comments: impulsive; watch RR Restrictions Weight Bearing Restrictions: No       Mobility Bed Mobility Overal bed mobility: Needs Assistance Bed Mobility: Sit to Supine       Sit to supine: Min guard;HOB elevated   General bed mobility comments: minguard for line mgmt  Transfers Overall transfer level: Needs assistance Equipment used: Rolling walker (2 wheeled) Transfers: Sit to/from [facility] Sit to Stand: Min assist;+2 physical assistance Stand pivot transfers: Min assist;+2 physical assistance       General transfer comment: pt complete sit<>stand from recliner needing cues for hand placement, heavy use of momentum to power up, MIN A +2 to pivot back to bed with RW, cues for safety awareness when descending onto bed    Balance Overall balance assessment: Needs assistance Sitting-balance support: Feet supported;No upper extremity supported Sitting balance-Leahy Scale: Fair Sitting balance - Comments: able to sit EOB with no UE support with close minguard Postural control: Posterior lean Standing balance support: Bilateral upper extremity supported Standing balance-Leahy Scale: Poor Standing balance comment: Reliant on UEs on RW                           ADL either performed or assessed with clinical judgement   ADL Overall ADL's : Needs assistance/impaired                         Toilet Transfer: Minimal assistance;Stand-pivot;RW;+2 for safety/equipment;+2 for physical assistance Toilet Transfer Details (indicate cue type and reason): simualted via functional mobility from recliner>EOB  with RW and MIN A +2 for safety and to power up into standing         Functional mobility during ADLs: Minimal assistance;+2 for physical assistance General ADL Comments: issued pt IS, education on functional exercies that can be done from recliner or bed to increase activity tolerance as  precursor to higher level BADLs. pt limited by fatigue this session after PT session     Vision       Perception     Praxis      Cognition Arousal/Alertness: Awake/alert Behavior During Therapy: WFL for tasks assessed/performed Overall Cognitive Status: Impaired/Different from baseline Area of Impairment: Safety/judgement;Awareness;Problem solving;Attention                   Current Attention Level: Sustained Memory: Decreased short-term memory Following Commands: Follows one step commands consistently;Follows one step commands with increased time;Follows multi-step commands inconsistently Safety/Judgement: Decreased awareness of deficits;Decreased awareness of safety Awareness: Emergent Problem Solving: Requires verbal cues;Difficulty sequencing General Comments: some cognitive deficits still persist although pt improving from previous sessions. pt can be easily distracted by extraneous topics but has difficulties with problem solving but following all commands and motivated to return to PLOF        Exercises Exercises: Other exercises (HEP handout in room, previously given) General Exercises - Lower Extremity Ankle Circles/Pumps: AROM;Both;10 reps;Supine Hip Flexion/Marching: AROM;10 reps;Both;Seated Other Exercises Other Exercises: pt completed x10 straight leg raises from long sitting in recliner with RLE, pt only able to tolerate x6 on LLE Other Exercises: demonstrated LAQ, Hip ABD/ ADD from chair for pt to complete as able Other Exercises: demonstrated placing BUEs on arm rests of chair and bringing trunk forward as well as chair pushups to be completed as pt feels able Other Exercises: pt completed x10 reps on IS pulling >1000 ml, encoaugred use every hour x10 reps   Shoulder Instructions       General Comments RR increase to as much as 33 with seated therex, issued pt IS with pt consistently pulling >1000 ml, pt on RA during session sats briefly drop to 85%  but able to rebound with PLB    Pertinent Vitals/ Pain       Pain Assessment: No/denies pain Faces Pain Scale: Hurts a little bit Pain Location: Soreness in chest from CPR Pain Descriptors / Indicators: Discomfort;Sore Pain Intervention(s): Monitored during session;Repositioned  Home Living                           Bathroom Accessibility: Yes How Accessible: Accessible via walker        Lives With: Spouse    Prior Functioning/Environment              Frequency  Min 2X/week        Progress Toward Goals  OT Goals(current goals can now be found in the care plan section)  Progress towards OT goals: Progressing toward goals  Acute Rehab OT Goals Patient Stated Goal: to get stronger OT Goal Formulation: With patient Time For Goal Achievement: 10/24/20 Potential to Achieve Goals: Good  Plan Discharge plan remains appropriate;Frequency remains appropriate    Co-evaluation                 AM-PAC OT "6 Clicks" Daily Activity     Outcome Measure   Help from another person eating meals?: None Help from another person taking care of personal grooming?: A Little Help from another person toileting, which  includes using toliet, bedpan, or urinal?: A Little Help from another person bathing (including washing, rinsing, drying)?: A Lot Help from another person to put on and taking off regular upper body clothing?: A Little Help from another person to put on and taking off regular lower body clothing?: A Lot 6 Click Score: 17    End of Session Equipment Utilized During Treatment: Gait belt;Rolling walker  OT Visit Diagnosis: Unsteadiness on feet (R26.81);Pain;Other symptoms and signs involving cognitive function;Other abnormalities of gait and mobility (R26.89)   Activity Tolerance Patient tolerated treatment well   Patient Left in bed;with call bell/phone within reach;with bed alarm set   Nurse Communication Mobility status        Time:  9794-8016 OT Time Calculation (min): 34 min  Charges: OT General Charges $OT Visit: 1 Visit OT Treatments $Therapeutic Activity: 23-37 mins  Harley Alto., COTA/L Acute Rehabilitation Services [phone]    Precious Haws 10/21/2020, 5:08 PM

## 2020-10-22 LAB — CBC WITH DIFFERENTIAL/PLATELET
Abs Immature Granulocytes: 0.06 10*3/uL (ref 0.00–0.07)
Basophils Absolute: 0 10*3/uL (ref 0.0–0.1)
Basophils Relative: 1 %
Eosinophils Absolute: 0.3 10*3/uL (ref 0.0–0.5)
Eosinophils Relative: 4 %
HCT: 26 % — ABNORMAL LOW (ref 39.0–52.0)
Hemoglobin: 8.6 g/dL — ABNORMAL LOW (ref 13.0–17.0)
Immature Granulocytes: 1 %
Lymphocytes Relative: 19 %
Lymphs Abs: 1.5 10*3/uL (ref 0.7–4.0)
MCH: 30.7 pg (ref 26.0–34.0)
MCHC: 33.1 g/dL (ref 30.0–36.0)
MCV: 92.9 fL (ref 80.0–100.0)
Monocytes Absolute: 0.8 10*3/uL (ref 0.1–1.0)
Monocytes Relative: 10 %
Neutro Abs: 5.1 10*3/uL (ref 1.7–7.7)
Neutrophils Relative %: 65 %
Platelets: 461 10*3/uL — ABNORMAL HIGH (ref 150–400)
RBC: 2.8 MIL/uL — ABNORMAL LOW (ref 4.22–5.81)
RDW: 14.1 % (ref 11.5–15.5)
WBC: 7.8 10*3/uL (ref 4.0–10.5)
nRBC: 0 % (ref 0.0–0.2)

## 2020-10-22 LAB — GLUCOSE, CAPILLARY
Glucose-Capillary: 160 mg/dL — ABNORMAL HIGH (ref 70–99)
Glucose-Capillary: 123 mg/dL — ABNORMAL HIGH (ref 70–99)
Glucose-Capillary: 152 mg/dL — ABNORMAL HIGH (ref 70–99)
Glucose-Capillary: 147 mg/dL — ABNORMAL HIGH (ref 70–99)

## 2020-10-22 LAB — RENAL FUNCTION PANEL
Albumin: 2.3 g/dL — ABNORMAL LOW (ref 3.5–5.0)
Anion gap: 16 — ABNORMAL HIGH (ref 5–15)
BUN: 99 mg/dL — ABNORMAL HIGH (ref 8–23)
CO2: 17 mmol/L — ABNORMAL LOW (ref 22–32)
Calcium: 8 mg/dL — ABNORMAL LOW (ref 8.9–10.3)
Chloride: 114 mmol/L — ABNORMAL HIGH (ref 98–111)
Creatinine, Ser: 5.73 mg/dL — ABNORMAL HIGH (ref 0.61–1.24)
GFR, Estimated: 10 mL/min — ABNORMAL LOW (ref >60)
Glucose, Bld: 152 mg/dL — ABNORMAL HIGH (ref 70–99)
Phosphorus: 6.6 mg/dL — ABNORMAL HIGH (ref 2.5–4.6)
Potassium: 4 mmol/L (ref 3.5–5.1)
Sodium: 147 mmol/L — ABNORMAL HIGH (ref 135–145)

## 2020-10-22 MED ORDER — METOPROLOL SUCCINATE ER 50 MG PO TB24
50.0000 mg | ORAL_TABLET | Freq: Every day | ORAL | Status: DC
  Administered 2020-10-23 – 2020-10-29 (×7): 50 mg via ORAL
  Filled 2020-10-22 (×7): qty 1

## 2020-10-22 NOTE — Progress Notes (Signed)
Progress Note  Patient Name: Timothy Moran Date of Encounter: 10/22/2020  Primary Cardiologist:   Jenne Campus, MD   Subjective   No chest pain.  Some breathing with ambulation.   Inpatient Medications    Scheduled Meds: . aspirin  81 mg Oral Daily  . atorvastatin  80 mg Oral Daily  . chlorhexidine  15 mL Mouth Rinse BID  . Chlorhexidine Gluconate Cloth  6 each Topical Q0600  . docusate sodium  200 mg Oral Daily  . enoxaparin (LOVENOX) injection  30 mg Subcutaneous Q24H  . feeding supplement (NEPRO CARB STEADY)  237 mL Oral BID BM  . guaiFENesin  10 mL Oral Q4H  . hydrALAZINE  25 mg Oral Q8H  . insulin aspart  0-15 Units Subcutaneous TID WC  . insulin aspart  0-5 Units Subcutaneous QHS  . isosorbide dinitrate  30 mg Oral BID  . mouth rinse  15 mL Mouth Rinse q12n4p  . metoprolol succinate  25 mg Oral Daily  . multivitamin with minerals  1 tablet Oral Daily  . senna-docusate  1 tablet Oral QHS  . sodium bicarbonate  1,300 mg Oral TID  . sodium chloride flush  3 mL Intravenous Q12H  . ticagrelor  90 mg Oral BID   Continuous Infusions: . sodium chloride Stopped (10/14/20 1029)  . ferric gluconate (FERRLECIT/NULECIT) IV 250 mg (10/21/20 0947)   PRN Meds: sodium chloride, acetaminophen, albuterol, diphenhydrAMINE, haloperidol lactate, LORazepam, melatonin, morphine injection, oxyCODONE, simethicone, sodium chloride flush, white petrolatum   Vital Signs    Vitals:   10/22/20 0411 10/22/20 0844 10/22/20 0846 10/22/20 1100  BP: 122/79  133/68 140/71  Pulse: 92  72 82  Resp: 19 20 19 16   Temp: 98.5 F (36.9 C)  97.6 F (36.4 C) 98.1 F (36.7 C)  TempSrc: Oral  Oral Oral  SpO2: 94%  98% 91%  Weight:      Height:        Intake/Output Summary (Last 24 hours) at 10/22/2020 1148 Last data filed at 10/22/2020 0409 Gross per 24 hour  Intake -  Output 2450 ml  Net -2450 ml   Filed Weights   10/19/20 0500 10/20/20 0500 10/21/20 0459  Weight: 100.3 kg 100.5  kg 96.8 kg    Telemetry    NSR, SB - Personally Reviewed  ECG    NA - Personally Reviewed  Physical Exam   GEN: No acute distress.   Neck: No  JVD Cardiac: RRR, no murmurs, rubs, or gallops.  Respiratory:    Few crackles and decreased basilar breath sounds GI: Soft, nontender, non-distended  MS: No  edema; No deformity. Neuro:  Nonfocal, mild short term memory disorder. Psych: Normal affect   Labs    Chemistry Recent Labs  Lab 10/20/20 0752 10/21/20 0016 10/22/20 0104  NA 140 144 147*  K 4.6 4.2 4.0  CL 108 111 114*  CO2 13* 15* 17*  GLUCOSE 110* 110* 152*  BUN 121* 118* 99*  CREATININE 8.02* 7.24* 5.73*  CALCIUM 7.9* 7.8* 8.0*  ALBUMIN 2.4* 2.4* 2.3*  GFRNONAA 6* 7* 10*  ANIONGAP 19* 18* 16*     Hematology Recent Labs  Lab 10/20/20 0752 10/21/20 0016 10/22/20 0104  WBC 11.2* 8.9 7.8  RBC 2.76* 2.69* 2.80*  HGB 8.6* 8.4* 8.6*  HCT 25.7* 25.2* 26.0*  MCV 93.1 93.7 92.9  MCH 31.2 31.2 30.7  MCHC 33.5 33.3 33.1  RDW 14.4 14.3 14.1  PLT 450* 462* 461*    Cardiac EnzymesNo  results for input(s): TROPONINI in the last 168 hours. No results for input(s): TROPIPOC in the last 168 hours.   BNPNo results for input(s): BNP, PROBNP in the last 168 hours.   DDimer No results for input(s): DDIMER in the last 168 hours.   Radiology    No results found.  Cardiac Studies    Cath 10/04/20 Conclusions: 1. Significant two-vessel coronary artery disease including thrombotic occlusion of the distal LCx, which is the culprit for the patient'sinferior STEMIas well as sequential 50% and 80% proximal/mid LAD lesions. 2. Mildly elevated left ventricular filling pressure (LVEDP 20-25 mmHg). 3. SuccessfulPCI to distal LCx using Resolute Onyx 2.5 x 30 mm drug-eluting stent complicated by no reflow treated with intracoronary adenosine and nitroglycerin, as well as aggressive antiplatelet therapy. Final angiogram shows 0% residual stenosis with TIMI-2  flow.  Recommendations: 1. Admit to 2H-ICU for post STEMI/cardiac arrestmonitoring. Consider targeted temperature management; critical care medicine has been consulted to assist with targeted temperature and ventilator management. 2. Continue tirofiban infusion for 6 hours. 3. Loaded with ticagrelor when patient arrives in ICU; patient should complete 12 months of dual antiplatelet therapy with aspirin and ticagrelor. 4. Obtain echocardiogram. 5. Aggressive secondary prevention of coronary artery disease including high intensity statin therapy. 6. Continue IV amiodarone overnight. 7. Consider staged PCI to LAD.  Echocardiogram 10/05/20 1. Since the study on 06/19/2020 LVEF has decreased, now severely impaired  at 20-25% and diffuse hypokinesis.  2. Left ventricular ejection fraction, by estimation, is 20 to 25%. The  left ventricle has severely decreased function. The left ventricle  demonstrates global hypokinesis. There is mild concentric left ventricular  hypertrophy. Left ventricular diastolic  parameters are consistent with Grade I diastolic dysfunction (impaired  relaxation).  3. Right ventricular systolic function is normal. The right ventricular  size is normal.  4. The mitral valve is normal in structure. Mild mitral valve  regurgitation. No evidence of mitral stenosis.  5. The aortic valve is normal in structure. Aortic valve regurgitation is  trivial. No aortic stenosis is present.  6. The inferior vena cava is normal in size with greater than 50%  respiratory variability, suggesting right atrial pressure of 3 mmHg.    Patient Profile     75 y.o. male with a PMH of LBBB, mildly reduced LVEF, thoracic aortic aneurysm (4.2cm), HTN, multiple symptomatic meningiomas (possible NF2 suspected), 2 prior PE's no longer on anticoagulation, and CKD stage 3, who presented with out of hospital VF arrest with successful resuscitation with post resuscitation EKG revealing inferior  STEMI.  Assessment & Plan    INFERIOR MI:  Slow recovery.    ACUTE SYSTOLIC HF:    Net negative 2.4 liters yesterday.  Sats drop with ambulation off of O2 but I suspect that this will get better.  I am going to increase his beta blocker.  Over time we can titrate his hydral/nitrates.  Avoiding ACE/ARB/ARNI.    Auto diuresis ongoing.    AKI:   Great news that he is making large amounts of urine and creat is improving and no plans any longer for dialysis.     LEUKOCYTOSIS:  Resolved.  No localizing etiology.  No further work up.   DM:  New diagnosis this admission   For questions or updates, please contact [facility] Please consult [email or web address] for contact info under Cardiology/STEMI.   Signed, Minus Breeding, MD  10/22/2020, 11:48 AM

## 2020-10-22 NOTE — Progress Notes (Signed)
Occupational Therapy Treatment Patient Details Name: Timothy Moran MRN: [id number] DOB: [birth year] Today's Date: 10/22/2020    History of present illness Pt is 75 year old male with a history of LBBB, mildly reduced LVEF, 4.2cm TAA, multiple symptomatic meningiomas (possible NF2 suspected), CKD stage III, two prior pulmonary embolisms (now off Eliquis), HTN, and pulmonary nodules, who presented as a resuscitated VF arrest at home and was found to have inferior ST elevation on ECG. He began having chest pain while shoveling snow this afternoon. He collapsed and lost consciousness with no obvious injuries in the fall. No bystander CPR performed. He was found with agonal breathing and an AED-shockable rhythm. He received a total of 4 defibrillations by fire and EMS.  Patient is more alert, but remains confused with decreased awareness and safety.  Pt s/p cardiac cath with stent on 1/23 and was intubated until 1/25. Plan for HD on 2/8 due to elevated Cr.   OT comments  Patient continues to progress toward stated goals.  His cognition is improving, and he is starting to recall events in the remote past, and is following commands better.  Deficits are listed below, and continue to impact independence in the acute setting.  Patient was found with O2 off, sats were 91% on RA, but he de-sats quickly with activity, down to 88%.  O2 reduced from 4 L to 2 L and patient was able to maintain 94%.  OT will continue to follow in the acute setting to maximize his functional status and continue to prepare him for CIR.    Follow Up Recommendations  CIR;Supervision/Assistance - 24 hour    Equipment Recommendations  Tub/shower seat    Recommendations for Other Services      Precautions / Restrictions Precautions Precautions: Fall Restrictions Weight Bearing Restrictions: No       Mobility Bed Mobility Overal bed mobility: Needs Assistance Bed Mobility: Sit to Supine     Supine to sit: Supervision         Transfers Overall transfer level: Needs assistance Equipment used: Rolling walker (2 wheeled) Transfers: Sit to/from Stand Sit to Stand: Min assist              Balance           Standing balance support: Bilateral upper extremity supported Standing balance-Leahy Scale: Poor Standing balance comment: Reliant on UEs on RW                           ADL either performed or assessed with clinical judgement   ADL       Grooming: Oral care;Supervision/safety Grooming Details (indicate cue type and reason): Mod A with shaving seated for thoroughness         Upper Body Dressing : Minimal assistance;Sitting                   Functional mobility during ADLs: Minimal assistance                         Cognition Arousal/Alertness: Awake/alert Behavior During Therapy: WFL for tasks assessed/performed Overall Cognitive Status: Impaired/Different from baseline Area of Impairment: Safety/judgement;Awareness;Problem solving;Attention                   Current Attention Level: Sustained   Following Commands: Follows one step commands consistently;Follows multi-step commands inconsistently Safety/Judgement: Decreased awareness of safety;Decreased awareness of deficits  Pertinent Vitals/ Pain       Pain Assessment: No/denies pain                                                          Frequency  Min 2X/week        Progress Toward Goals  OT Goals(current goals can now be found in the care plan section)  Progress towards OT goals: Progressing toward goals  Acute Rehab OT Goals Patient Stated Goal: to get stronger OT Goal Formulation: With patient Time For Goal Achievement: 10/24/20 Potential to Achieve Goals: Good  Plan Discharge plan remains appropriate;Frequency remains appropriate    Co-evaluation                 AM-PAC OT "6 Clicks" Daily  Activity     Outcome Measure   Help from another person eating meals?: None Help from another person taking care of personal grooming?: A Little Help from another person toileting, which includes using toliet, bedpan, or urinal?: A Little Help from another person bathing (including washing, rinsing, drying)?: A Lot Help from another person to put on and taking off regular upper body clothing?: A Little Help from another person to put on and taking off regular lower body clothing?: A Little 6 Click Score: 18    End of Session Equipment Utilized During Treatment: Rolling walker;Oxygen  OT Visit Diagnosis: Unsteadiness on feet (R26.81);Pain;Other symptoms and signs involving cognitive function;Other abnormalities of gait and mobility (R26.89)   Activity Tolerance Patient limited by fatigue   Patient Left in bed;with call bell/phone within reach;with bed alarm set;with family/visitor present   Nurse Communication          Time: 6962-9528 OT Time Calculation (min): 31 min  Charges: OT General Charges $OT Visit: 1 Visit OT Treatments $Self Care/Home Management : 23-37 mins  10/22/2020  Timothy Moran, OTR/L  Acute Rehabilitation Services  Office:  [phone]    Timothy Moran 10/22/2020, 10:32 AM

## 2020-10-22 NOTE — Progress Notes (Signed)
[facility] NEPHROLOGY PROGRESS NOTE  Assessment/ Plan: Pt is a 75 y.o. yo male with DM, HTN, left bundle branch block, CHF with reduced EF, meningioma, CKD stage III with baseline creatinine level 1.4-1.5 admitted with Cardiac arrest with inferior STEMI, PCI to LAD and complicated by pneumonia and AKI.  #Acute kidney injury on CKD stage III: Due to cardiorenal syndrome. He had AKI after admission due to cardiac arrest/contrast which had some recovery however developed AKI again probably due to cardiorenal syndrome/reduced renal perfusion concomitant with acute urine retention. He is having renal recovery with increasing urine output.  Clinically stable today.  No plan for dialysis therefore cancel HD catheter placement.  Start diet.  Discussed with the patient and his wife today. Continue indwelling Foley catheter, strict ins and out. Daily lab.   #Cardiac arrest with inferolateral STEMI: Status post emergent coronary angiography on 1/23 with drug-eluting stent placement to dominant circumflex vessel and reestablishment of flow without any postprocedural complications noted.  Ongoing risk factor modification at this time with blood pressure control/lipid control and ARB on hold.  # Acute encephalopathy: Alert awake and oriented today. Overall looks stable.   # Acute exacerbation of congestive heart failure: No lower extremity edema. Lasix on hold because of AKI. Continue monitor.  #Anemia: Iron saturation 10%.  Started IV iron x 3 doses and Aranesp dose on 2/9. Monitor hemoglobin.  #Metabolic acidosis: Increased sodium bicarbonate.  # Leukocytosis: Chest x-ray has some atelectasis. Improved.  Subjective: Seen and examined at bedside.  Urine output around 2.4 L.  Denies nausea vomiting chest pain shortness of breath.  His wife at bedside.  Creatinine level trending down to 5.7.  Objective Vital signs in last 24 hours: Vitals:   10/21/20 2357 10/22/20 0411 10/22/20 0844  10/22/20 0846  BP: 136/76 122/79  133/68  Pulse: 93 92  72  Resp: 20 19 20 19   Temp: 97.9 F (36.6 C) 98.5 F (36.9 C)  97.6 F (36.4 C)  TempSrc: Oral Oral  Oral  SpO2: 92% 94%  98%  Weight:      Height:       Weight change:   Intake/Output Summary (Last 24 hours) at 10/22/2020 0950 Last data filed at 10/22/2020 0409 Gross per 24 hour  Intake -  Output 2450 ml  Net -2450 ml       Labs: Basic Metabolic Panel: Recent Labs  Lab 10/20/20 0752 10/21/20 0016 10/22/20 0104  NA 140 144 147*  K 4.6 4.2 4.0  CL 108 111 114*  CO2 13* 15* 17*  GLUCOSE 110* 110* 152*  BUN 121* 118* 99*  CREATININE 8.02* 7.24* 5.73*  CALCIUM 7.9* 7.8* 8.0*  PHOS 10.3* 8.5* 6.6*   Liver Function Tests: Recent Labs  Lab 10/20/20 0752 10/21/20 0016 10/22/20 0104  ALBUMIN 2.4* 2.4* 2.3*   No results for input(s): LIPASE, AMYLASE in the last 168 hours. No results for input(s): AMMONIA in the last 168 hours. CBC: Recent Labs  Lab 10/18/20 0720 10/18/20 0720 10/19/20 1126 10/20/20 0752 10/21/20 0016 10/22/20 0104  WBC 21.2*  --  14.9* 11.2* 8.9 7.8  NEUTROABS  --    < > 12.3* 8.7* 6.5 5.1  HGB 9.5*  --  9.0* 8.6* 8.4* 8.6*  HCT 29.4*  --  27.5* 25.7* 25.2* 26.0*  MCV 94.8  --  93.9 93.1 93.7 92.9  PLT 443*  --  504* 450* 462* 461*   < > = values in this interval not displayed.  Cardiac Enzymes: No results for input(s): CKTOTAL, CKMB, CKMBINDEX, TROPONINI in the last 168 hours. CBG: Recent Labs  Lab 10/21/20 0608 10/21/20 1142 10/21/20 1605 10/21/20 2021 10/22/20 0629  GLUCAP 111* 181* 126* 113* 160*    Iron Studies:  Recent Labs    10/19/20 1126  IRON 26*  TIBC 249*  FERRITIN 633*   Studies/Results: No results found.  Medications: Infusions: . sodium chloride Stopped (10/14/20 1029)  . ferric gluconate (FERRLECIT/NULECIT) IV 250 mg (10/21/20 0947)    Scheduled Medications: . aspirin  81 mg Oral Daily  . atorvastatin  80 mg Oral Daily  . chlorhexidine   15 mL Mouth Rinse BID  . Chlorhexidine Gluconate Cloth  6 each Topical Q0600  . docusate sodium  200 mg Oral Daily  . enoxaparin (LOVENOX) injection  30 mg Subcutaneous Q24H  . feeding supplement (NEPRO CARB STEADY)  237 mL Oral BID BM  . guaiFENesin  10 mL Oral Q4H  . hydrALAZINE  25 mg Oral Q8H  . insulin aspart  0-15 Units Subcutaneous TID WC  . insulin aspart  0-5 Units Subcutaneous QHS  . isosorbide dinitrate  30 mg Oral BID  . mouth rinse  15 mL Mouth Rinse q12n4p  . metoprolol succinate  25 mg Oral Daily  . multivitamin with minerals  1 tablet Oral Daily  . senna-docusate  1 tablet Oral QHS  . sodium bicarbonate  1,300 mg Oral TID  . sodium chloride flush  3 mL Intravenous Q12H  . ticagrelor  90 mg Oral BID    have reviewed scheduled and prn medications.  Physical Exam: General:NAD, alert awake and comfortable Heart:RRR, s1s2 nl, no rubs Lungs: Clear bilateral, no increased work of breathing Abdomen:soft, Non-tender, non-distended Extremities:No LE edema Neurology: Alert awake and oriented  [facility] 10/22/2020,9:50 AM  LOS: 18 days

## 2020-10-22 NOTE — Progress Notes (Signed)
Mobility Specialist - Progress Note   10/22/20 1647  Mobility  Activity Refused mobility   Pt refusing mobility due to being upset that his wife has left for the day. He continued to refuse despite importance of mobility being explained to him.   Pricilla Handler Mobility Specialist Mobility Specialist Phone: [phone]

## 2020-10-22 NOTE — Progress Notes (Signed)
Inpatient Rehabilitation Admissions Coordinator  Discussed with Dr. Percival Spanish. PCI pending renal recovery. I do not have a CIR bed for this patient to admit to until  sometime next week. I met with patient and his wife at bedside and they are aware. We will follow up early next week to determine possible CIR admit pending bed availability, as well as renal function recovery to clarify further timing of possible PCI.  Danne Baxter, RN, MSN Rehab Admissions Coordinator [phone] 10/22/2020 12:55 PM

## 2020-10-23 LAB — CBC WITH DIFFERENTIAL/PLATELET
Abs Immature Granulocytes: 0.1 10*3/uL — ABNORMAL HIGH (ref 0.00–0.07)
Basophils Absolute: 0 10*3/uL (ref 0.0–0.1)
Basophils Relative: 0 %
Eosinophils Absolute: 0.4 10*3/uL (ref 0.0–0.5)
Eosinophils Relative: 5 %
HCT: 27.1 % — ABNORMAL LOW (ref 39.0–52.0)
Hemoglobin: 8.6 g/dL — ABNORMAL LOW (ref 13.0–17.0)
Immature Granulocytes: 1 %
Lymphocytes Relative: 20 %
Lymphs Abs: 1.8 10*3/uL (ref 0.7–4.0)
MCH: 29.7 pg (ref 26.0–34.0)
MCHC: 31.7 g/dL (ref 30.0–36.0)
MCV: 93.4 fL (ref 80.0–100.0)
Monocytes Absolute: 0.9 10*3/uL (ref 0.1–1.0)
Monocytes Relative: 9 %
Neutro Abs: 5.8 10*3/uL (ref 1.7–7.7)
Neutrophils Relative %: 65 %
Platelets: 472 10*3/uL — ABNORMAL HIGH (ref 150–400)
RBC: 2.9 MIL/uL — ABNORMAL LOW (ref 4.22–5.81)
RDW: 13.9 % (ref 11.5–15.5)
WBC: 9 10*3/uL (ref 4.0–10.5)
nRBC: 0 % (ref 0.0–0.2)

## 2020-10-23 LAB — RENAL FUNCTION PANEL
Albumin: 2.2 g/dL — ABNORMAL LOW (ref 3.5–5.0)
Anion gap: 13 (ref 5–15)
BUN: 81 mg/dL — ABNORMAL HIGH (ref 8–23)
CO2: 20 mmol/L — ABNORMAL LOW (ref 22–32)
Calcium: 8.1 mg/dL — ABNORMAL LOW (ref 8.9–10.3)
Chloride: 112 mmol/L — ABNORMAL HIGH (ref 98–111)
Creatinine, Ser: 4.58 mg/dL — ABNORMAL HIGH (ref 0.61–1.24)
GFR, Estimated: 13 mL/min — ABNORMAL LOW (ref >60)
Glucose, Bld: 144 mg/dL — ABNORMAL HIGH (ref 70–99)
Phosphorus: 4.7 mg/dL — ABNORMAL HIGH (ref 2.5–4.6)
Potassium: 3.7 mmol/L (ref 3.5–5.1)
Sodium: 145 mmol/L (ref 135–145)

## 2020-10-23 LAB — GLUCOSE, CAPILLARY
Glucose-Capillary: 113 mg/dL — ABNORMAL HIGH (ref 70–99)
Glucose-Capillary: 204 mg/dL — ABNORMAL HIGH (ref 70–99)
Glucose-Capillary: 134 mg/dL — ABNORMAL HIGH (ref 70–99)
Glucose-Capillary: 119 mg/dL — ABNORMAL HIGH (ref 70–99)

## 2020-10-23 MED ORDER — LIVING WELL WITH DIABETES BOOK
Freq: Once | Status: DC
  Filled 2020-10-23: qty 1

## 2020-10-23 NOTE — Progress Notes (Signed)
Foley to remain indwelling per nephrology.  Will cont plan of care

## 2020-10-23 NOTE — Progress Notes (Signed)
Inpatient Diabetes Program Recommendations  AACE/ADA: New Consensus Statement on Inpatient Glycemic Control (2015)  Target Ranges:  Prepandial:   less than 140 mg/dL      Peak postprandial:   less than 180 mg/dL (1-2 hours)      Critically ill patients:  140 - 180 mg/dL   Lab Results  Component Value Date   GLUCAP 204 (H) 10/23/2020   HGBA1C 7.0 (H) 10/06/2020    Review of Glycemic Control Results for Timothy Moran, Timothy "REIN" (MRN [id number]) as of 10/23/2020 16:08  Ref. Range 10/22/2020 11:12 10/22/2020 16:20 10/22/2020 21:09 10/23/2020 06:13 10/23/2020 11:18  Glucose-Capillary Latest Ref Range: 70 - 99 mg/dL 123 (H) 152 (H) 147 (H) 113 (H) 204 (H)   Diabetes history: DM 2-New diagnosis Current orders for Inpatient glycemic control:  Novolog moderate tid with meals and HS  Inpatient Diabetes Program Recommendations:    Spoke with patient regarding elevated A1C and new diagnosis of DM.  Spoke with pt about new diagnosis.  Discussed A1C results with him and explained what an A1C is, basic pathophysiology of DM Type 2, basic home care, importance of checking CBGs and maintaining good CBG control to prevent long-term and short-term complications.  Reviewed signs and symptoms of hyperglycemia and hypoglycemia.  RNs to provide ongoing basic DM education at bedside with this patient.  Have ordered educational booklet.  Thanks,  Adah Perl, RN, BC-ADM Inpatient Diabetes Coordinator Pager [phone] (8a-5p)

## 2020-10-23 NOTE — Progress Notes (Signed)
Physical Therapy Treatment Patient Details Name: Timothy Moran MRN: [id number] DOB: 1946/03/14 Today's Date: 10/23/2020    History of Present Illness Pt is 75 year old male with a history of LBBB, mildly reduced LVEF, 4.2cm TAA, multiple symptomatic meningiomas (possible NF2 suspected), CKD stage III, two prior pulmonary embolisms (now off Eliquis), HTN, and pulmonary nodules, who presented as a resuscitated VF arrest at home and was found to have inferior ST elevation on ECG. He began having chest pain while shoveling snow this afternoon. He collapsed and lost consciousness with no obvious injuries in the fall. No bystander CPR performed. He was found with agonal breathing and an AED-shockable rhythm. He received a total of 4 defibrillations by fire and EMS.  Patient is more alert, but remains confused with decreased awareness and safety.  Pt s/p cardiac cath with stent on 1/23 and was intubated until 1/25.    PT Comments    Pt received in supine, anxious but agreeable to therapy session with encouragement and with good participation and fair tolerance for mobility. Pt performed bed mobility with min guard/multimodal cues, transfers with up to +77minA and gait limited due to pt reporting symptoms of nausea in stance (no vomiting), SpO2 and HR WNL but respiratory rate remains elevated with mobility up to mid-30's rpm. Pt performed supine BLE A/AAROM therapeutic exercises with good activation and multimodal cues due to cognition, pt does better when encouraged to count out loud for reps. Pt continues to benefit from PT services to progress toward functional mobility goals. Continue to recommend CIR.  Follow Up Recommendations  CIR     Equipment Recommendations  Rolling walker with 5" wheels;Wheelchair (measurements PT);Wheelchair cushion (measurements PT)    Recommendations for Other Services       Precautions / Restrictions Precautions Precautions: Fall Precaution Comments: impulsive;  watch RR/O2 Restrictions Weight Bearing Restrictions: No    Mobility  Bed Mobility Overal bed mobility: Needs Assistance Bed Mobility: Supine to Sit     Supine to sit: Min guard;HOB elevated     General bed mobility comments: Extra time and repeated cues to initiate bed mob, needing cues to sequence leg and trunk movement. Min guard to come to sit EOB with HOB elevated and with use of rails.    Transfers Overall transfer level: Needs assistance Equipment used: Rolling walker (2 wheeled) Transfers: Sit to/from [facility] Sit to Stand: Min assist;+2 safety/equipment         General transfer comment: from EOB>BSC, then BSC>chair, pt c/o feeling dizzy and deferred further standing tasks, although SpO2 and HR WNL on 4L O2 [place], RR to mid-30's with mobility and pt reports feeling "pressure" in abdomen but unable to have BM.  Ambulation/Gait                 Stairs             Wheelchair Mobility    Modified Rankin (Stroke Patients Only)       Balance Overall balance assessment: Needs assistance Sitting-balance support: Bilateral upper extremity supported;Feet supported Sitting balance-Leahy Scale: Poor Sitting balance - Comments: Prefers UE support at EOB, supervision for safety.   Standing balance support: Bilateral upper extremity supported;Single extremity supported Standing balance-Leahy Scale: Poor Standing balance comment: Reliant on UEs on RW and external assist                            Cognition Arousal/Alertness: Awake/alert Behavior During Therapy: Impulsive;WFL for  tasks assessed/performed Overall Cognitive Status: Impaired/Different from baseline Area of Impairment: Attention;Following commands;Safety/judgement;Awareness;Problem solving;Memory                 Orientation Level: Disoriented to;Time Current Attention Level: Sustained Memory: Decreased short-term memory Following Commands: Follows one step  commands consistently;Follows one step commands with increased time;Follows multi-step commands inconsistently Safety/Judgement: Decreased awareness of deficits;Decreased awareness of safety Awareness: Emergent Problem Solving: Requires verbal cues;Difficulty sequencing General Comments: pt reports feeling frustrated with slow recovery but seems aware of some deficits, agreeable to session with encouragement and discussion of benefits of mobility      Exercises General Exercises - Lower Extremity Ankle Circles/Pumps: AROM;Both;10 reps;Supine Quad Sets: AROM;10 reps;Both;Supine Short Arc Quad: AROM;Both;10 reps;Supine Long Arc Quad: AROM;Both;10 reps;Seated Heel Slides: AROM;Both;10 reps;Other reps (comment) Hip ABduction/ADduction: AROM;Both;10 reps;Supine (pt reports discomfort at L hip/LE with this movement) Straight Leg Raises: Strengthening;Both;AAROM;10 reps;Supine    General Comments General comments (skin integrity, edema, etc.): geomat cushion in chair and chair alarm set once up, spouse in room and NT notified of pt position      Pertinent Vitals/Pain Pain Assessment: No/denies pain Pain Intervention(s): Monitored during session;Repositioned    Home Living                      Prior Function            PT Goals (current goals can now be found in the care plan section) Acute Rehab PT Goals Patient Stated Goal: to get stronger PT Goal Formulation: With patient/family Time For Goal Achievement: 10/24/20 Potential to Achieve Goals: Good Progress towards PT goals: Progressing toward goals    Frequency    Min 3X/week      PT Plan Current plan remains appropriate    Co-evaluation              AM-PAC PT "6 Clicks" Mobility   Outcome Measure  Help needed turning from your back to your side while in a flat bed without using bedrails?: A Little Help needed moving from lying on your back to sitting on the side of a flat bed without using bedrails?: A  Little Help needed moving to and from a bed to a chair (including a wheelchair)?: A Little Help needed standing up from a chair using your arms (e.g., wheelchair or bedside chair)?: A Little Help needed to walk in hospital room?: A Lot Help needed climbing 3-5 steps with a railing? : Total 6 Click Score: 15    End of Session Equipment Utilized During Treatment: Gait belt;Oxygen Activity Tolerance: Patient tolerated treatment well Patient left: with call bell/phone within reach;with family/visitor present;in chair;with chair alarm set Nurse Communication: Mobility status PT Visit Diagnosis: Unsteadiness on feet (R26.81);Other abnormalities of gait and mobility (R26.89);Muscle weakness (generalized) (M62.81);Difficulty in walking, not elsewhere classified (R26.2)     Time: 4599-7741 PT Time Calculation (min) (ACUTE ONLY): 42 min  Charges:  $Therapeutic Exercise: 23-37 mins $Therapeutic Activity: 8-22 mins                     Tashae Inda P., PTA Acute Rehabilitation Services Pager: [phone] Office: [place] 10/23/2020, 3:39 PM

## 2020-10-23 NOTE — Progress Notes (Signed)
Inpatient Rehab Admissions Coordinator:  There are no beds available for pt to admit to CIR today.  Will continue to follow.    Gayland Curry, [place], [place] Admissions Coordinator [phone]

## 2020-10-23 NOTE — Progress Notes (Addendum)
[facility] NEPHROLOGY PROGRESS NOTE  Assessment/ Plan: Pt is a 75 y.o. yo male with DM, HTN, left bundle branch block, CHF with reduced EF, meningioma, CKD stage III with baseline creatinine level 1.4-1.5 admitted with Cardiac arrest with inferior STEMI, PCI to LAD and complicated by pneumonia and AKI.  #Acute kidney injury on CKD stage III: Due to cardiorenal syndrome. He had AKI after admission due to cardiac arrest/contrast which had some recovery however developed AKI again probably due to cardiorenal syndrome/reduced renal perfusion concomitant with acute urine retention. Decent urine output and creatinine level continue to improve.  He is having renal recovery.  Clinically stable.  No need for dialysis.  Continue Foley catheter for ins and out.  Daily lab.  #Cardiac arrest with inferolateral STEMI: Status post emergent coronary angiography on 1/23 with drug-eluting stent placement to dominant circumflex vessel and reestablishment of flow without any postprocedural complications noted.  Ongoing risk factor modification at this time with blood pressure control/lipid control and ARB on hold.  # Acute encephalopathy: Alert awake and oriented today. Overall looks stable.   # Acute exacerbation of congestive heart failure: No lower extremity edema. Lasix on hold because of AKI. Continue monitor.  #Anemia: Iron saturation 10%.  Started IV iron x 3 doses and Aranesp dose on 2/9. Monitor hemoglobin.  #Metabolic acidosis: Increased sodium bicarbonate.  # Leukocytosis: Chest x-ray has some atelectasis. Improved.  Subjective: Seen and examined at bedside.  Urine output 1.3 L in 24 hours.  BUN 81 and creatinine level down to 4.5 today.  Denies nausea, vomiting, chest pain, shortness of breath.  His wife at bedside. Objective Vital signs in last 24 hours: Vitals:   10/23/20 0347 10/23/20 0540 10/23/20 0740 10/23/20 1114  BP: 132/76  132/83 121/68  Pulse: 72  73 72  Resp: 20  17  16   Temp: 98 F (36.7 C)   98.3 F (36.8 C)  TempSrc: Oral  Oral Oral  SpO2: 95%  93% 94%  Weight:  100.7 kg    Height:       Weight change:   Intake/Output Summary (Last 24 hours) at 10/23/2020 1149 Last data filed at 10/23/2020 1114 Gross per 24 hour  Intake -  Output 2175 ml  Net -2175 ml       Labs: Basic Metabolic Panel: Recent Labs  Lab 10/21/20 0016 10/22/20 0104 10/23/20 0029  NA 144 147* 145  K 4.2 4.0 3.7  CL 111 114* 112*  CO2 15* 17* 20*  GLUCOSE 110* 152* 144*  BUN 118* 99* 81*  CREATININE 7.24* 5.73* 4.58*  CALCIUM 7.8* 8.0* 8.1*  PHOS 8.5* 6.6* 4.7*   Liver Function Tests: Recent Labs  Lab 10/21/20 0016 10/22/20 0104 10/23/20 0029  ALBUMIN 2.4* 2.3* 2.2*   No results for input(s): LIPASE, AMYLASE in the last 168 hours. No results for input(s): AMMONIA in the last 168 hours. CBC: Recent Labs  Lab 10/19/20 1126 10/20/20 0752 10/21/20 0016 10/22/20 0104 10/23/20 0029  WBC 14.9* 11.2* 8.9 7.8 9.0  NEUTROABS 12.3* 8.7* 6.5 5.1 5.8  HGB 9.0* 8.6* 8.4* 8.6* 8.6*  HCT 27.5* 25.7* 25.2* 26.0* 27.1*  MCV 93.9 93.1 93.7 92.9 93.4  PLT 504* 450* 462* 461* 472*   Cardiac Enzymes: No results for input(s): CKTOTAL, CKMB, CKMBINDEX, TROPONINI in the last 168 hours. CBG: Recent Labs  Lab 10/22/20 1112 10/22/20 1620 10/22/20 2109 10/23/20 0613 10/23/20 1118  GLUCAP 123* 152* 147* 113* 204*    Iron Studies:  No  results for input(s): IRON, TIBC, TRANSFERRIN, FERRITIN in the last 72 hours. Studies/Results: No results found.  Medications: Infusions: . sodium chloride Stopped (10/14/20 1029)    Scheduled Medications: . aspirin  81 mg Oral Daily  . atorvastatin  80 mg Oral Daily  . chlorhexidine  15 mL Mouth Rinse BID  . Chlorhexidine Gluconate Cloth  6 each Topical Q0600  . docusate sodium  200 mg Oral Daily  . enoxaparin (LOVENOX) injection  30 mg Subcutaneous Q24H  . feeding supplement (NEPRO CARB STEADY)  237 mL Oral BID BM  .  guaiFENesin  10 mL Oral Q4H  . hydrALAZINE  25 mg Oral Q8H  . insulin aspart  0-15 Units Subcutaneous TID WC  . insulin aspart  0-5 Units Subcutaneous QHS  . isosorbide dinitrate  30 mg Oral BID  . mouth rinse  15 mL Mouth Rinse q12n4p  . metoprolol succinate  50 mg Oral Daily  . multivitamin with minerals  1 tablet Oral Daily  . senna-docusate  1 tablet Oral QHS  . sodium bicarbonate  1,300 mg Oral TID  . sodium chloride flush  3 mL Intravenous Q12H  . ticagrelor  90 mg Oral BID    have reviewed scheduled and prn medications.  Physical Exam: General:NAD, alert awake Heart:RRR, s1s2 nl, no rubs Lungs: Clear bilateral, no increased work of breathing Abdomen:soft, Non-tender, non-distended Extremities:No LE edema Neurology: Alert awake but confused, stable.  Zanya Lindo Prasad Samvel Zinn 10/23/2020,11:49 AM  LOS: 19 days

## 2020-10-23 NOTE — Progress Notes (Addendum)
Progress Note  Patient Name: Timothy Moran Date of Encounter: 10/23/2020  [facility] Cardiologist: Jenne Campus, MD   Subjective   No complaints, just bored. Anticipate discharge to SNF on Monday. Wife concerned about getting LAD PCI prior to discharge so he can participate in rehab better. I expressed that his renal function would likely not allow this. If is unable to tolerate therapies, we can titrate angi-anginals OP.   Inpatient Medications    Scheduled Meds: . aspirin  81 mg Oral Daily  . atorvastatin  80 mg Oral Daily  . chlorhexidine  15 mL Mouth Rinse BID  . Chlorhexidine Gluconate Cloth  6 each Topical Q0600  . docusate sodium  200 mg Oral Daily  . enoxaparin (LOVENOX) injection  30 mg Subcutaneous Q24H  . feeding supplement (NEPRO CARB STEADY)  237 mL Oral BID BM  . guaiFENesin  10 mL Oral Q4H  . hydrALAZINE  25 mg Oral Q8H  . insulin aspart  0-15 Units Subcutaneous TID WC  . insulin aspart  0-5 Units Subcutaneous QHS  . isosorbide dinitrate  30 mg Oral BID  . mouth rinse  15 mL Mouth Rinse q12n4p  . metoprolol succinate  50 mg Oral Daily  . multivitamin with minerals  1 tablet Oral Daily  . senna-docusate  1 tablet Oral QHS  . sodium bicarbonate  1,300 mg Oral TID  . sodium chloride flush  3 mL Intravenous Q12H  . ticagrelor  90 mg Oral BID   Continuous Infusions: . sodium chloride Stopped (10/14/20 1029)   PRN Meds: sodium chloride, acetaminophen, albuterol, diphenhydrAMINE, haloperidol lactate, LORazepam, melatonin, morphine injection, oxyCODONE, simethicone, sodium chloride flush, white petrolatum   Vital Signs    Vitals:   10/22/20 2341 10/23/20 0347 10/23/20 0540 10/23/20 0740  BP: 131/76 132/76  132/83  Pulse: 71 72  73  Resp: 20 20  17   Temp: 97.8 F (36.6 C) 98 F (36.7 C)    TempSrc: Oral Oral  Oral  SpO2: 93% 95%  93%  Weight:   100.7 kg   Height:        Intake/Output Summary (Last 24 hours) at 10/23/2020 1017 Last data  filed at 10/23/2020 0554 Gross per 24 hour  Intake --  Output 1325 ml  Net -1325 ml   Last 3 Weights 10/23/2020 10/21/2020 10/20/2020  Weight (lbs) 222 lb 0.1 oz 213 lb 4.8 oz 221 lb 8 oz  Weight (kg) 100.7 kg 96.752 kg 100.472 kg      Telemetry    Sinus rhythm in the 70s - Personally Reviewed  ECG    Sinus rhythm HR 70, LBBB, left axis deviation, TWI lateral leads - Personally Reviewed  Physical Exam   GEN: No acute distress.   Neck: No JVD Cardiac: RRR, no murmurs, rubs, or gallops.  Respiratory: respirations unlaobred GI: Soft, nontender, non-distended  MS: No edema; No deformity. Neuro:  Nonfocal  Psych: Normal affect   Labs    High Sensitivity Troponin:   Recent Labs  Lab 10/04/20 1808 10/04/20 2324 10/05/20 0500  TROPONINIHS 75* >27,000* >27,000*      Chemistry Recent Labs  Lab 10/21/20 0016 10/22/20 0104 10/23/20 0029  NA 144 147* 145  K 4.2 4.0 3.7  CL 111 114* 112*  CO2 15* 17* 20*  GLUCOSE 110* 152* 144*  BUN 118* 99* 81*  CREATININE 7.24* 5.73* 4.58*  CALCIUM 7.8* 8.0* 8.1*  ALBUMIN 2.4* 2.3* 2.2*  GFRNONAA 7* 10* 13*  ANIONGAP 18* 16*  13     Hematology Recent Labs  Lab 10/21/20 0016 10/22/20 0104 10/23/20 0029  WBC 8.9 7.8 9.0  RBC 2.69* 2.80* 2.90*  HGB 8.4* 8.6* 8.6*  HCT 25.2* 26.0* 27.1*  MCV 93.7 92.9 93.4  MCH 31.2 30.7 29.7  MCHC 33.3 33.1 31.7  RDW 14.3 14.1 13.9  PLT 462* 461* 472*    BNPNo results for input(s): BNP, PROBNP in the last 168 hours.   DDimer No results for input(s): DDIMER in the last 168 hours.   Radiology    No results found.  Cardiac Studies   Cath 10/04/20 Conclusions: 1. Significant two-vessel coronary artery disease including thrombotic occlusion of the distal LCx, which is the culprit for the patient'sinferior STEMIas well as sequential 50% and 80% proximal/mid LAD lesions. 2. Mildly elevated left ventricular filling pressure (LVEDP 20-25 mmHg). 3. SuccessfulPCI to distal LCx using  Resolute Onyx 2.5 x 30 mm drug-eluting stent complicated by no reflow treated with intracoronary adenosine and nitroglycerin, as well as aggressive antiplatelet therapy. Final angiogram shows 0% residual stenosis with TIMI-2 flow.  Recommendations: 1. Admit to 2H-ICU for post STEMI/cardiac arrestmonitoring. Consider targeted temperature management; critical care medicine has been consulted to assist with targeted temperature and ventilator management. 2. Continue tirofiban infusion for 6 hours. 3. Loaded with ticagrelor when patient arrives in ICU; patient should complete 12 months of dual antiplatelet therapy with aspirin and ticagrelor. 4. Obtain echocardiogram. 5. Aggressive secondary prevention of coronary artery disease including high intensity statin therapy. 6. Continue IV amiodarone overnight. 7. Consider staged PCI to LAD.   Echocardiogram 10/05/20 1. Since the study on 06/19/2020 LVEF has decreased, now severely impaired  at 20-25% and diffuse hypokinesis.  2. Left ventricular ejection fraction, by estimation, is 20 to 25%. The  left ventricle has severely decreased function. The left ventricle  demonstrates global hypokinesis. There is mild concentric left ventricular  hypertrophy. Left ventricular diastolic  parameters are consistent with Grade I diastolic dysfunction (impaired  relaxation).  3. Right ventricular systolic function is normal. The right ventricular  size is normal.  4. The mitral valve is normal in structure. Mild mitral valve  regurgitation. No evidence of mitral stenosis.  5. The aortic valve is normal in structure. Aortic valve regurgitation is  trivial. No aortic stenosis is present.  6. The inferior vena cava is normal in size with greater than 50%  respiratory variability, suggesting right atrial pressure of 3 mmHg.   Patient Profile     75 y.o. male a PMH of LBBB, mildly reduced LVEF, thoracic aortic aneurysm (4.2cm), HTN, multiple symptomatic  meningiomas (possible NF2 suspected), 2 prior PE's no longer on anticoagulation, and CKD stage 3, who presented with out of hospital VF arrest with successful resuscitation with post resuscitation EKG revealing inferior STEMI.  Assessment & Plan    Inferior STEMI CAD - thrombotic occlusion of the distal LCx - culprit lesion - 50-80% sequential lesion in the prox-mid LAD - DES to  LCx - could consider intervening on LAD in the future - pending symptoms and recovery of renal function - continue ASA and brilinta, 80 mg lipitor - no further chest pain   Acute on chronic systolic and diastolic heart failure - EF 20-25% with grade 1 DD --> further reduction in EF from echo 10/21 with EF 40-45% - given renal function, no ACEI/ARB/ARNI - continue hydralazine 25 mg TID and isordil 30 mg BID - continue toprol 50 mg daily - weight is 222 lbs from peak of  240 lbs - not on diuretic, auto diuresing - CO2 20 --> continue close monitoring   AKI - sCr improving to 4.58 from peak of 8.02 - K 3.7 - appreciate nephrology recs, no HD - normal renal ultrasound on 10/18/20   DM - new diagnosis this admission - A1c 7.0% - on SSI - if kidney function improves, good candidate for SGLT2i   Anemia - Hb 8.6 --> stable - consider adding nu-iron   Hypoxia - remains on supplemental O2 - did not take oxygen at home - continue auto-diuresis and wean a tolerated   For questions or updates, please contact [facility] Please consult [email or web address] for contact info under     Signed, Ledora Bottcher, PA  10/23/2020, 10:17 AM     History and all data above reviewed.  Patient examined.  I agree with the findings as above.  No pain.  Breathing slowly improved.  On 4 liters but might be able to start weaning down.  The patient exam reveals COR:RRR  ,  Lungs: Few basilar crackles  ,  Abd: Positive bowel sounds, no rebound no guarding, Ext No edema  .  All available labs, radiology testing, previous  records reviewed. Agree with documented assessment and plan.   AKI:  Try to get rid of the Foley today.  Follow.  Auto diuresing. CAD:  I will try to titrate the beta blocker over the weekend.  Plan is medical management and elective PCI of LAD in the future.  He might actually do quite well with OMT and ultimately decisions about PCI can be made at a later date.    Timothy Moran  12:08 PM  10/23/2020

## 2020-10-24 LAB — CBC WITH DIFFERENTIAL/PLATELET
Abs Immature Granulocytes: 0.15 10*3/uL — ABNORMAL HIGH (ref 0.00–0.07)
Basophils Absolute: 0 10*3/uL (ref 0.0–0.1)
Basophils Relative: 0 %
Eosinophils Absolute: 0.4 10*3/uL (ref 0.0–0.5)
Eosinophils Relative: 4 %
HCT: 27.8 % — ABNORMAL LOW (ref 39.0–52.0)
Hemoglobin: 9.4 g/dL — ABNORMAL LOW (ref 13.0–17.0)
Immature Granulocytes: 2 %
Lymphocytes Relative: 23 %
Lymphs Abs: 2.4 10*3/uL (ref 0.7–4.0)
MCH: 30.8 pg (ref 26.0–34.0)
MCHC: 33.8 g/dL (ref 30.0–36.0)
MCV: 91.1 fL (ref 80.0–100.0)
Monocytes Absolute: 0.9 10*3/uL (ref 0.1–1.0)
Monocytes Relative: 8 %
Neutro Abs: 6.5 10*3/uL (ref 1.7–7.7)
Neutrophils Relative %: 63 %
Platelets: 449 10*3/uL — ABNORMAL HIGH (ref 150–400)
RBC: 3.05 MIL/uL — ABNORMAL LOW (ref 4.22–5.81)
RDW: 13.7 % (ref 11.5–15.5)
WBC: 10.3 10*3/uL (ref 4.0–10.5)
nRBC: 0 % (ref 0.0–0.2)

## 2020-10-24 LAB — RENAL FUNCTION PANEL
Albumin: 2.2 g/dL — ABNORMAL LOW (ref 3.5–5.0)
Anion gap: 13 (ref 5–15)
BUN: 64 mg/dL — ABNORMAL HIGH (ref 8–23)
CO2: 19 mmol/L — ABNORMAL LOW (ref 22–32)
Calcium: 8.4 mg/dL — ABNORMAL LOW (ref 8.9–10.3)
Chloride: 113 mmol/L — ABNORMAL HIGH (ref 98–111)
Creatinine, Ser: 3.73 mg/dL — ABNORMAL HIGH (ref 0.61–1.24)
GFR, Estimated: 16 mL/min — ABNORMAL LOW (ref >60)
Glucose, Bld: 118 mg/dL — ABNORMAL HIGH (ref 70–99)
Phosphorus: 3.8 mg/dL (ref 2.5–4.6)
Potassium: 3.7 mmol/L (ref 3.5–5.1)
Sodium: 145 mmol/L (ref 135–145)

## 2020-10-24 LAB — GLUCOSE, CAPILLARY
Glucose-Capillary: 126 mg/dL — ABNORMAL HIGH (ref 70–99)
Glucose-Capillary: 177 mg/dL — ABNORMAL HIGH (ref 70–99)
Glucose-Capillary: 123 mg/dL — ABNORMAL HIGH (ref 70–99)
Glucose-Capillary: 109 mg/dL — ABNORMAL HIGH (ref 70–99)

## 2020-10-24 NOTE — Progress Notes (Signed)
Pt seems to be confused tonight, unable to get comfortable in bed and asking to lye down on the floor. Trying to make patient as much as comfortable possible and we'll continue to monitor.

## 2020-10-24 NOTE — Progress Notes (Signed)
[facility] NEPHROLOGY PROGRESS NOTE  Assessment/ Plan: Pt is a 75 y.o. yo male with DM, HTN, left bundle branch block, CHF with reduced EF, meningioma, CKD stage III with baseline creatinine level 1.4-1.5 admitted with Cardiac arrest with inferior STEMI, PCI to LAD and complicated by pneumonia and AKI.  #Acute kidney injury on CKD stage III: Due to cardiorenal syndrome. He had AKI after admission due to cardiac arrest/contrast which had some recovery however developed AKI again probably due to cardiorenal syndrome/reduced renal perfusion concomitant with acute urine retention. Increasing urine output and serum creatinine level continue to trend down to 3.7 from 8.  He is auto diuresing.  Clinically looks good.  Can discontinue Foley catheter in next 1 or 2 days and then do bladder scan.  Expect renal recovery to his baseline.  Nothing further to add.  We will sign off, please call back with question.  #Cardiac arrest with inferolateral STEMI: Status post emergent coronary angiography on 1/23 with drug-eluting stent placement to dominant circumflex vessel and reestablishment of flow without any postprocedural complications noted.  Ongoing risk factor modification at this time with blood pressure control/lipid control and ARB on hold.  # Acute encephalopathy: Alert awake and oriented today. Overall looks stable.   # Acute exacerbation of congestive heart failure: No lower extremity edema.  Euvolemic,continue monitor.  #Anemia: Iron saturation 10%.  Started IV iron x 3 doses and Aranesp dose on 2/9. Monitor hemoglobin.  #Metabolic acidosis: Increased sodium bicarbonate.  # Leukocytosis: Chest x-ray has some atelectasis. Improved.  Call back with question.  Subjective: Seen and examined at bedside.  Urine output 2.2 L, creatinine level trending down to 3.7.  No nausea, vomiting, chest pain or shortness of breath.  His wife at bedside.  Objective Vital signs in last 24  hours: Vitals:   10/23/20 2344 10/24/20 0447 10/24/20 0500 10/24/20 0810  BP: 96/73 128/73  125/72  Pulse: 76 70    Resp: 20 20  19   Temp: 98.8 F (37.1 C) 98.1 F (36.7 C)  97.6 F (36.4 C)  TempSrc: Oral Oral  Oral  SpO2: 91% 92%  93%  Weight:   94.8 kg   Height:       Weight change: -5.9 kg  Intake/Output Summary (Last 24 hours) at 10/24/2020 1046 Last data filed at 10/24/2020 1011 Gross per 24 hour  Intake --  Output 2750 ml  Net -2750 ml       Labs: Basic Metabolic Panel: Recent Labs  Lab 10/22/20 0104 10/23/20 0029 10/24/20 0228  NA 147* 145 145  K 4.0 3.7 3.7  CL 114* 112* 113*  CO2 17* 20* 19*  GLUCOSE 152* 144* 118*  BUN 99* 81* 64*  CREATININE 5.73* 4.58* 3.73*  CALCIUM 8.0* 8.1* 8.4*  PHOS 6.6* 4.7* 3.8   Liver Function Tests: Recent Labs  Lab 10/22/20 0104 10/23/20 0029 10/24/20 0228  ALBUMIN 2.3* 2.2* 2.2*   No results for input(s): LIPASE, AMYLASE in the last 168 hours. No results for input(s): AMMONIA in the last 168 hours. CBC: Recent Labs  Lab 10/20/20 0752 10/21/20 0016 10/22/20 0104 10/23/20 0029 10/24/20 0228  WBC 11.2* 8.9 7.8 9.0 10.3  NEUTROABS 8.7* 6.5 5.1 5.8 6.5  HGB 8.6* 8.4* 8.6* 8.6* 9.4*  HCT 25.7* 25.2* 26.0* 27.1* 27.8*  MCV 93.1 93.7 92.9 93.4 91.1  PLT 450* 462* 461* 472* 449*   Cardiac Enzymes: No results for input(s): CKTOTAL, CKMB, CKMBINDEX, TROPONINI in the last 168 hours. CBG: Recent  Labs  Lab 10/23/20 0613 10/23/20 1118 10/23/20 1609 10/23/20 2111 10/24/20 0620  GLUCAP 113* 204* 134* 119* 126*    Iron Studies:  No results for input(s): IRON, TIBC, TRANSFERRIN, FERRITIN in the last 72 hours. Studies/Results: No results found.  Medications: Infusions: . sodium chloride Stopped (10/14/20 1029)    Scheduled Medications: . aspirin  81 mg Oral Daily  . atorvastatin  80 mg Oral Daily  . chlorhexidine  15 mL Mouth Rinse BID  . Chlorhexidine Gluconate Cloth  6 each Topical Q0600  . docusate  sodium  200 mg Oral Daily  . enoxaparin (LOVENOX) injection  30 mg Subcutaneous Q24H  . feeding supplement (NEPRO CARB STEADY)  237 mL Oral BID BM  . guaiFENesin  10 mL Oral Q4H  . hydrALAZINE  25 mg Oral Q8H  . insulin aspart  0-15 Units Subcutaneous TID WC  . insulin aspart  0-5 Units Subcutaneous QHS  . isosorbide dinitrate  30 mg Oral BID  . living well with diabetes book   Does not apply Once  . mouth rinse  15 mL Mouth Rinse q12n4p  . metoprolol succinate  50 mg Oral Daily  . multivitamin with minerals  1 tablet Oral Daily  . senna-docusate  1 tablet Oral QHS  . sodium bicarbonate  1,300 mg Oral TID  . sodium chloride flush  3 mL Intravenous Q12H  . ticagrelor  90 mg Oral BID    have reviewed scheduled and prn medications.  Physical Exam: General: Alert awake, not in distress, able to lie flat Heart:RRR, s1s2 nl, no rubs Lungs: Clear bilateral, no increased work of breathing Abdomen:soft, Non-tender, non-distended Extremities:No LE edema Neurology: Alert awake but confused, stable.  Delaine Canter Prasad Marlene Pfluger 10/24/2020,10:46 AM  LOS: 20 days

## 2020-10-24 NOTE — Progress Notes (Addendum)
Progress Note  Patient Name: Koury Roddy Date of Encounter: 10/24/2020  [facility] Cardiologist: Jenne Campus, MD   Subjective   Confused last night.  He does not report SOB or pain.   Inpatient Medications    Scheduled Meds: . aspirin  81 mg Oral Daily  . atorvastatin  80 mg Oral Daily  . chlorhexidine  15 mL Mouth Rinse BID  . Chlorhexidine Gluconate Cloth  6 each Topical Q0600  . docusate sodium  200 mg Oral Daily  . enoxaparin (LOVENOX) injection  30 mg Subcutaneous Q24H  . feeding supplement (NEPRO CARB STEADY)  237 mL Oral BID BM  . guaiFENesin  10 mL Oral Q4H  . hydrALAZINE  25 mg Oral Q8H  . insulin aspart  0-15 Units Subcutaneous TID WC  . insulin aspart  0-5 Units Subcutaneous QHS  . isosorbide dinitrate  30 mg Oral BID  . living well with diabetes book   Does not apply Once  . mouth rinse  15 mL Mouth Rinse q12n4p  . metoprolol succinate  50 mg Oral Daily  . multivitamin with minerals  1 tablet Oral Daily  . senna-docusate  1 tablet Oral QHS  . sodium bicarbonate  1,300 mg Oral TID  . sodium chloride flush  3 mL Intravenous Q12H  . ticagrelor  90 mg Oral BID   Continuous Infusions: . sodium chloride Stopped (10/14/20 1029)   PRN Meds: sodium chloride, acetaminophen, albuterol, diphenhydrAMINE, haloperidol lactate, LORazepam, melatonin, morphine injection, oxyCODONE, simethicone, sodium chloride flush, white petrolatum   Vital Signs    Vitals:   10/23/20 2344 10/24/20 0447 10/24/20 0500 10/24/20 0810  BP: 96/73 128/73  125/72  Pulse: 76 70    Resp: 20 20  19   Temp: 98.8 F (37.1 C) 98.1 F (36.7 C)  97.6 F (36.4 C)  TempSrc: Oral Oral  Oral  SpO2: 91% 92%  93%  Weight:   94.8 kg   Height:        Intake/Output Summary (Last 24 hours) at 10/24/2020 1103 Last data filed at 10/24/2020 1011 Gross per 24 hour  Intake --  Output 2750 ml  Net -2750 ml   Last 3 Weights 10/24/2020 10/23/2020 10/21/2020  Weight (lbs) 208 lb 15.9 oz 222  lb 0.1 oz 213 lb 4.8 oz  Weight (kg) 94.8 kg 100.7 kg 96.752 kg      Telemetry    NSR with ectopy - Personally Reviewed  ECG    NA  Physical Exam   GEN: No  acute distress.   Neck: No  JVD Cardiac: RRR, no murmurs, rubs, or gallops.  Respiratory:     Decreased breath sounds at the right base.  GI: Soft, nontender, non-distended, normal bowel sounds  MS:  No edema; No deformity. Neuro:   Nonfocal  Psych: Oriented and appropriate.  There is definitely short term memory dysfunction and mild baseline confusion   Labs    High Sensitivity Troponin:   Recent Labs  Lab 10/04/20 1808 10/04/20 2324 10/05/20 0500  TROPONINIHS 75* >27,000* >27,000*      Chemistry Recent Labs  Lab 10/22/20 0104 10/23/20 0029 10/24/20 0228  NA 147* 145 145  K 4.0 3.7 3.7  CL 114* 112* 113*  CO2 17* 20* 19*  GLUCOSE 152* 144* 118*  BUN 99* 81* 64*  CREATININE 5.73* 4.58* 3.73*  CALCIUM 8.0* 8.1* 8.4*  ALBUMIN 2.3* 2.2* 2.2*  GFRNONAA 10* 13* 16*  ANIONGAP 16* 13 13     Hematology  Recent Labs  Lab 10/22/20 0104 10/23/20 0029 10/24/20 0228  WBC 7.8 9.0 10.3  RBC 2.80* 2.90* 3.05*  HGB 8.6* 8.6* 9.4*  HCT 26.0* 27.1* 27.8*  MCV 92.9 93.4 91.1  MCH 30.7 29.7 30.8  MCHC 33.1 31.7 33.8  RDW 14.1 13.9 13.7  PLT 461* 472* 449*    BNPNo results for input(s): BNP, PROBNP in the last 168 hours.   DDimer No results for input(s): DDIMER in the last 168 hours.   Radiology    No results found.  Cardiac Studies   Cath 10/04/20 Conclusions: 1. Significant two-vessel coronary artery disease including thrombotic occlusion of the distal LCx, which is the culprit for the patient'sinferior STEMIas well as sequential 50% and 80% proximal/mid LAD lesions. 2. Mildly elevated left ventricular filling pressure (LVEDP 20-25 mmHg). 3. SuccessfulPCI to distal LCx using Resolute Onyx 2.5 x 30 mm drug-eluting stent complicated by no reflow treated with intracoronary adenosine and  nitroglycerin, as well as aggressive antiplatelet therapy. Final angiogram shows 0% residual stenosis with TIMI-2 flow.  Recommendations: 1. Admit to 2H-ICU for post STEMI/cardiac arrestmonitoring. Consider targeted temperature management; critical care medicine has been consulted to assist with targeted temperature and ventilator management. 2. Continue tirofiban infusion for 6 hours. 3. Loaded with ticagrelor when patient arrives in ICU; patient should complete 12 months of dual antiplatelet therapy with aspirin and ticagrelor. 4. Obtain echocardiogram. 5. Aggressive secondary prevention of coronary artery disease including high intensity statin therapy. 6. Continue IV amiodarone overnight. 7. Consider staged PCI to LAD.   Echocardiogram 10/05/20 1. Since the study on 06/19/2020 LVEF has decreased, now severely impaired  at 20-25% and diffuse hypokinesis.  2. Left ventricular ejection fraction, by estimation, is 20 to 25%. The  left ventricle has severely decreased function. The left ventricle  demonstrates global hypokinesis. There is mild concentric left ventricular  hypertrophy. Left ventricular diastolic  parameters are consistent with Grade I diastolic dysfunction (impaired  relaxation).  3. Right ventricular systolic function is normal. The right ventricular  size is normal.  4. The mitral valve is normal in structure. Mild mitral valve  regurgitation. No evidence of mitral stenosis.  5. The aortic valve is normal in structure. Aortic valve regurgitation is  trivial. No aortic stenosis is present.  6. The inferior vena cava is normal in size with greater than 50%  respiratory variability, suggesting right atrial pressure of 3 mmHg.   Patient Profile     75 y.o. male a PMH of LBBB, mildly reduced LVEF, thoracic aortic aneurysm (4.2cm), HTN, multiple symptomatic meningiomas (possible NF2 suspected), 2 prior PE's no longer on anticoagulation, and CKD stage 3, who presented  with out of hospital VF arrest with successful resuscitation with post resuscitation EKG revealing inferior STEMI.  Assessment & Plan    Inferior STEMI CAD Rehab OK for Monday or when bed available.  We need to follow in in patient rehab.   Acute on chronic systolic and diastolic heart failure Avoiding ARB/ARNI.  Net negative 11 liters.    Continues to auto diurese.  No indication for diuretic.   AKI Creat is mildly better today.  Much improved over the week.  Improvement seems to have slowed.  Avoiding all nephrotoxins.   Remove Foley today.   DM Continue current therapy.   Anemia Minimize lab draws.  I will not have labs drawn tomorrow.  Please put them in as needed.    Hypoxia Down to 2 liters.  Ambulate.    Disposition to inpatient  rehab when a bed is ready.    For questions or updates, please contact [place] Please consult [email or web address] for contact info under     Signed, Minus Breeding, MD  10/24/2020, 11:03 AM

## 2020-10-25 LAB — RENAL FUNCTION PANEL
Albumin: 2.4 g/dL — ABNORMAL LOW (ref 3.5–5.0)
Anion gap: 13 (ref 5–15)
BUN: 51 mg/dL — ABNORMAL HIGH (ref 8–23)
CO2: 22 mmol/L (ref 22–32)
Calcium: 8.5 mg/dL — ABNORMAL LOW (ref 8.9–10.3)
Chloride: 112 mmol/L — ABNORMAL HIGH (ref 98–111)
Creatinine, Ser: 3.28 mg/dL — ABNORMAL HIGH (ref 0.61–1.24)
GFR, Estimated: 19 mL/min — ABNORMAL LOW (ref >60)
Glucose, Bld: 122 mg/dL — ABNORMAL HIGH (ref 70–99)
Phosphorus: 3.7 mg/dL (ref 2.5–4.6)
Potassium: 3.9 mmol/L (ref 3.5–5.1)
Sodium: 147 mmol/L — ABNORMAL HIGH (ref 135–145)

## 2020-10-25 LAB — CBC WITH DIFFERENTIAL/PLATELET
Abs Immature Granulocytes: 0.15 10*3/uL — ABNORMAL HIGH (ref 0.00–0.07)
Basophils Absolute: 0.1 10*3/uL (ref 0.0–0.1)
Basophils Relative: 1 %
Eosinophils Absolute: 0.6 10*3/uL — ABNORMAL HIGH (ref 0.0–0.5)
Eosinophils Relative: 5 %
HCT: 32.5 % — ABNORMAL LOW (ref 39.0–52.0)
Hemoglobin: 10 g/dL — ABNORMAL LOW (ref 13.0–17.0)
Immature Granulocytes: 2 %
Lymphocytes Relative: 27 %
Lymphs Abs: 2.7 10*3/uL (ref 0.7–4.0)
MCH: 29.9 pg (ref 26.0–34.0)
MCHC: 30.8 g/dL (ref 30.0–36.0)
MCV: 97 fL (ref 80.0–100.0)
Monocytes Absolute: 0.7 10*3/uL (ref 0.1–1.0)
Monocytes Relative: 7 %
Neutro Abs: 6 10*3/uL (ref 1.7–7.7)
Neutrophils Relative %: 58 %
Platelets: 493 10*3/uL — ABNORMAL HIGH (ref 150–400)
RBC: 3.35 MIL/uL — ABNORMAL LOW (ref 4.22–5.81)
RDW: 13.9 % (ref 11.5–15.5)
WBC: 10.2 10*3/uL (ref 4.0–10.5)
nRBC: 0 % (ref 0.0–0.2)

## 2020-10-25 LAB — GLUCOSE, CAPILLARY
Glucose-Capillary: 131 mg/dL — ABNORMAL HIGH (ref 70–99)
Glucose-Capillary: 148 mg/dL — ABNORMAL HIGH (ref 70–99)
Glucose-Capillary: 139 mg/dL — ABNORMAL HIGH (ref 70–99)
Glucose-Capillary: 100 mg/dL — ABNORMAL HIGH (ref 70–99)

## 2020-10-25 NOTE — Progress Notes (Signed)
Patient is put back on Oxygen and he seems to be less confused. We'll continue to monitor.

## 2020-10-25 NOTE — Progress Notes (Signed)
Mobility Specialist: Progress Note   10/25/20 1547  Mobility  Activity Transferred:  Bed to chair  Level of Assistance Minimal assist, patient does 75% or more  Assistive Device  (Hand held assist)  Distance Ambulated (ft) 3 ft  Mobility Response Tolerated well  Mobility performed by Mobility specialist  Bed Position Chair  $Mobility charge 1 Mobility   Post-Mobility: 77 HR, 107/73 BP, 96% SpO2  Pt c/o back pain during transfer, no rating given.   [facility] Timothy Moran Mobility Specialist Mobility Specialist Phone: [phone]

## 2020-10-25 NOTE — Progress Notes (Signed)
Pt due to void. Foley removed at 1100 today.  Pt states he does not fell urge to void. Bladder scan shows 216 ml. Pt earlier had multiple attempts to void shortly after foley removal.  Will pass along to PM RN and continue to monitor. Payton Emerald, RN

## 2020-10-26 ENCOUNTER — Encounter (HOSPITAL_COMMUNITY): Payer: Self-pay | Admitting: Internal Medicine

## 2020-10-26 DIAGNOSIS — I2510 Atherosclerotic heart disease of native coronary artery without angina pectoris: Secondary | ICD-10-CM

## 2020-10-26 DIAGNOSIS — R319 Hematuria, unspecified: Secondary | ICD-10-CM

## 2020-10-26 DIAGNOSIS — I5043 Acute on chronic combined systolic (congestive) and diastolic (congestive) heart failure: Secondary | ICD-10-CM | POA: Diagnosis not present

## 2020-10-26 DIAGNOSIS — G931 Anoxic brain damage, not elsewhere classified: Secondary | ICD-10-CM | POA: Diagnosis present

## 2020-10-26 DIAGNOSIS — E119 Type 2 diabetes mellitus without complications: Secondary | ICD-10-CM

## 2020-10-26 DIAGNOSIS — E785 Hyperlipidemia, unspecified: Secondary | ICD-10-CM | POA: Diagnosis present

## 2020-10-26 DIAGNOSIS — I25118 Atherosclerotic heart disease of native coronary artery with other forms of angina pectoris: Secondary | ICD-10-CM | POA: Diagnosis present

## 2020-10-26 DIAGNOSIS — I4901 Ventricular fibrillation: Secondary | ICD-10-CM | POA: Diagnosis not present

## 2020-10-26 DIAGNOSIS — D509 Iron deficiency anemia, unspecified: Secondary | ICD-10-CM

## 2020-10-26 DIAGNOSIS — I469 Cardiac arrest, cause unspecified: Secondary | ICD-10-CM | POA: Diagnosis not present

## 2020-10-26 DIAGNOSIS — N183 Chronic kidney disease, stage 3 unspecified: Secondary | ICD-10-CM

## 2020-10-26 DIAGNOSIS — I255 Ischemic cardiomyopathy: Secondary | ICD-10-CM

## 2020-10-26 DIAGNOSIS — Z95820 Peripheral vascular angioplasty status with implants and grafts: Secondary | ICD-10-CM

## 2020-10-26 DIAGNOSIS — R339 Retention of urine, unspecified: Secondary | ICD-10-CM | POA: Diagnosis not present

## 2020-10-26 DIAGNOSIS — E1169 Type 2 diabetes mellitus with other specified complication: Secondary | ICD-10-CM

## 2020-10-26 HISTORY — DX: Atherosclerotic heart disease of native coronary artery without angina pectoris: I25.10

## 2020-10-26 HISTORY — DX: Type 2 diabetes mellitus without complications: E11.9

## 2020-10-26 HISTORY — DX: Peripheral vascular angioplasty status with implants and grafts: Z95.820

## 2020-10-26 HISTORY — DX: Hematuria, unspecified: R31.9

## 2020-10-26 HISTORY — DX: Retention of urine, unspecified: R33.9

## 2020-10-26 HISTORY — DX: Hyperlipidemia, unspecified: E78.5

## 2020-10-26 HISTORY — DX: Iron deficiency anemia, unspecified: D50.9

## 2020-10-26 HISTORY — DX: Acute on chronic combined systolic (congestive) and diastolic (congestive) heart failure: I50.43

## 2020-10-26 HISTORY — DX: Anoxic brain damage, not elsewhere classified: G93.1

## 2020-10-26 HISTORY — DX: Ischemic cardiomyopathy: I25.5

## 2020-10-26 LAB — CBC
HCT: 30.6 % — ABNORMAL LOW (ref 39.0–52.0)
Hemoglobin: 10 g/dL — ABNORMAL LOW (ref 13.0–17.0)
MCH: 31.1 pg (ref 26.0–34.0)
MCHC: 32.7 g/dL (ref 30.0–36.0)
MCV: 95 fL (ref 80.0–100.0)
Platelets: 506 10*3/uL — ABNORMAL HIGH (ref 150–400)
RBC: 3.22 MIL/uL — ABNORMAL LOW (ref 4.22–5.81)
RDW: 14 % (ref 11.5–15.5)
WBC: 10 10*3/uL (ref 4.0–10.5)
nRBC: 0 % (ref 0.0–0.2)

## 2020-10-26 LAB — BASIC METABOLIC PANEL
Anion gap: 13 (ref 5–15)
BUN: 41 mg/dL — ABNORMAL HIGH (ref 8–23)
CO2: 21 mmol/L — ABNORMAL LOW (ref 22–32)
Calcium: 8.7 mg/dL — ABNORMAL LOW (ref 8.9–10.3)
Chloride: 109 mmol/L (ref 98–111)
Creatinine, Ser: 3.09 mg/dL — ABNORMAL HIGH (ref 0.61–1.24)
GFR, Estimated: 20 mL/min — ABNORMAL LOW (ref >60)
Glucose, Bld: 197 mg/dL — ABNORMAL HIGH (ref 70–99)
Potassium: 3.7 mmol/L (ref 3.5–5.1)
Sodium: 143 mmol/L (ref 135–145)

## 2020-10-26 LAB — GLUCOSE, CAPILLARY
Glucose-Capillary: 103 mg/dL — ABNORMAL HIGH (ref 70–99)
Glucose-Capillary: 210 mg/dL — ABNORMAL HIGH (ref 70–99)
Glucose-Capillary: 123 mg/dL — ABNORMAL HIGH (ref 70–99)

## 2020-10-26 MED ORDER — CHLORHEXIDINE GLUCONATE CLOTH 2 % EX PADS
6.0000 | MEDICATED_PAD | Freq: Every day | CUTANEOUS | Status: DC
  Administered 2020-10-26 – 2020-10-28 (×3): 6 via TOPICAL

## 2020-10-26 MED ORDER — INSULIN ASPART 100 UNIT/ML ~~LOC~~ SOLN: 0.0000 [IU] | Freq: Three times a day (TID) | SUBCUTANEOUS | Status: DC

## 2020-10-26 MED ORDER — GLUCERNA SHAKE PO LIQD: 237.0000 mL | Freq: Three times a day (TID) | ORAL | Status: DC

## 2020-10-26 MED ORDER — GLIMEPIRIDE 1 MG PO TABS
1.0000 mg | ORAL_TABLET | Freq: Every day | ORAL | Status: DC
  Administered 2020-10-26 – 2020-10-27 (×2): 1 mg via ORAL
  Filled 2020-10-26 (×2): qty 1

## 2020-10-26 MED ORDER — TAMSULOSIN HCL 0.4 MG PO CAPS
0.4000 mg | ORAL_CAPSULE | Freq: Every day | ORAL | Status: DC
  Administered 2020-10-26 – 2020-10-29 (×4): 0.4 mg via ORAL
  Filled 2020-10-26 (×4): qty 1

## 2020-10-26 MED ORDER — LIVING WELL WITH DIABETES BOOK
Freq: Once | Status: DC
  Filled 2020-10-26: qty 1

## 2020-10-26 NOTE — Progress Notes (Signed)
Patient attempted to void twice during the night with no success, bladder scan shows 853.intermittant cath done 800 cc removed.

## 2020-10-26 NOTE — Progress Notes (Signed)
Inpatient Rehab Admissions Coordinator:  Saw pt and wife at bedside. Informed them that there are no beds available in CIR today. Will continue to follow.    Gayland Curry, [place], [place] Admissions Coordinator [phone]

## 2020-10-26 NOTE — Progress Notes (Signed)
Several blood clots noted coming out of penis when doing  Peri care prior to insertion of foley. Perineal area cleaned prior to insertion of foley, foley catheter inserted without difficulty urine that returned was slightly pink initially then turned clear yellow no blood clots noted at this time. Will monitor. Bedside RN present. Lizzette Carbonell, Bettina Gavia RN

## 2020-10-26 NOTE — Progress Notes (Signed)
Mobility Specialist - Progress Note   10/26/20 1547  Mobility  Activity Stood at bedside  Level of Assistance Minimal assist, patient does 75% or more  Assistive Device Front wheel walker  Mobility Response Tolerated fair  Mobility performed by Mobility specialist  $Mobility charge 1 Mobility   Pre-mobility, RA: 72 HR, 112/100 BP, 95% SpO2 During mobility, RA: 101 HR, 89-92%SpO2 Post-mobility, RA: 75 HR, 130/78 BP, 94% SpO2  Pt taken off of O2 and was min assist to sit up on edge of bed. Once sitting up, he began c/o dizziness and was finally agreeable to stand after several minutes. When standing, pt stated the dizziness was worsening and after standing for one minute he refused any ambulation and got back into bed. Pt left on RA, wife in room.  Pricilla Handler Mobility Specialist Mobility Specialist Phone: [phone]

## 2020-10-26 NOTE — Consult Note (Signed)
[facility] Medical Consultation  Juanpablo Ciresi UXL:[id number] DOB: October 13, 1945 DOA: 10/04/2020 PCP: Cari Caraway, MD   Requesting physician: Cherlynn Kaiser, MD Date of consultation: 10/26/2020 Reason for consultation: New onset diabetes mellitus type 2  Impression/Recommendations Principal Problem:   Acute respiratory failure ([place]) Active Problems:   Left bundle branch block   Personal history of pulmonary embolism   Chronic kidney disease, stage III (moderate) ([place])   Hypertension   Cardiac arrest with ventricular fibrillation ([place])   STEMI (ST elevation myocardial infarction) ([place])   SOB (shortness of breath)   Acute blood loss anemia   AKI (acute kidney injury) ([place])   Pneumonia of both lungs due to infectious organism    1. Cardiac arrest with inferolateral STEMI precipitate it 5 V. fib arrest: Patient status post V. fib arrest with multiple defibrillations. Status post PCI with drug-eluting stent to the left circumflex with plans to consider staged PCI to LAD in the future depending on renal function. Patient was placed on aspirin, Brilinta, statin, beta-blocker, and isosorbide dinitrate.  -Per cardiology  2. Acute combined congestive heart failure/ICM: EF noted to be around 20-25%. -Per primary   3. Acute kidney injury superimposed on chronic kidney disease stage III: Thought secondary to cardiorenal syndrome. Creatinine initially elevated up to 8.02. Nephrology has been consulted and placed temporary hemodialysis catheter. However, patient's urine output and creatinine started to trend down last creatinine 3.28 on 2/13. -Continue to monitor kidney function   4. Diabetes mellitus type 2: Patient was found to be newly diabetic initial blood glucose was elevated into the 230s.  Hemoglobin A1c was noted to be 7.  Patient had initially been placed on a moderate sliding scale insulin with meals, but had not recently been requiring significant amounts of insulin.  Due to  the patient's poor kidney function Metformin is contraindicated. Discussed options with the patient who would like to be on pill instead of receiving insulin shots. -Continue hypoglycemic protocol -Discontinued sliding scale of insulin -Start Amaryl 1 mg daily  -Continue CBGs before every meal and at bedtime and keep a log  5. Iron deficiency anemia: Hemoglobin 10 g/dL today which appears to be improving. Iron saturation was 10%. Patient has been started on IV iron infusions x3 doses and Aranesp on 2/9.  6. Iron deficiency anemia: Hemoglobin 10 g/dL currently patient had been I will followup again tomorrow. Please contact me if I can be of assistance in the meanwhile. Thank you for this consultation.  Chief Complaint: Chest pain  HPI:  Timothy Moran is a 75 y.o. male with medical history significant of hypertension, congestive heart failure EF, meningiomas, history of pulmonary embolus, chronic kidney disease who had presented last month with complaints of chest pain after shoveling snow.  Patient was noted to go into cardiac arrest had inferior STEMI status post PCI with DES to distal LCx on 1/23.  Echocardiogram from 1/24 EF noted to be around 20-25% hospital course was complicated by an pneumonia and acute renal failure.  Creatinine trended up to 8.02.secondary to a cardiorenal syndrome.  Nephrology and IR had been consulted.  Hemodialysis catheter was placed, but patient creatinine started to trend down and urine output was increased. His initial glucose levels were elevated up to 230s and hemoglobin A1c was noted to be 7.  Patient was placed on sliding scale insulin during his hospitalization, but was not requiring significant amounts of insulin. Patient reports that he has never had diabetes before and would not like to give himself insulin  once discharged home.   Review of Systems  Respiratory: Negative for shortness of breath.   Cardiovascular: Negative for chest pain.     Past  Medical History:  Diagnosis Date  . Abnormal echocardiogram 06/07/2019  . Arrhythmia   . Ascending aortic aneurysm (HCC) 4.2 cm based on CT from December 2020 01/29/2018   4.3 cm by echo in 2019  . Benign brain tumor ([place])   . Benign neoplasm of brain ([place]) 12/19/2017  . Chest pain 12/04/2017  . Chronic kidney disease    Stage 3  . Chronic kidney disease, stage III (moderate) ([place]) 12/19/2017  . Diabetes mellitus, type 2 ([place]) 10/06/2020   10/06/20 A1C 7%  . Dizziness 10/11/2019  . History of pulmonary embolus (PE)   . Hypertension   . Left bundle branch block 12/19/2017  . Nonsustained ventricular tachycardia ([place]) 10/11/2019  . Pain of left hip joint 05/29/2020  . Personal history of pulmonary embolism 12/19/2017  . Pulmonary hypertension due to thromboembolism ([place]) 03/09/2018   See echo 12/27/17 with nl PAS vs CTa chest 02/23/18   . Sinus bradycardia 12/22/2017  . Solitary pulmonary nodule on lung CT 03/08/2018   CT 12/04/17 1.0 x 0.8 x 0.7 cm nodular opacity in the RUL vs not seen 03/16/10 posterior segment of the right upper lobe. [place] KitchenSpirometry 03/08/2018    FEV1 3.86 (108%)  Ratio 96 s prior rx  - PET  03/13/18   Low grade c/w adenoca > rec T surgery eval     Past Surgical History:  Procedure Laterality Date  . APPENDECTOMY    . CORONARY/GRAFT ACUTE MI REVASCULARIZATION N/A 10/04/2020   Procedure: Coronary/Graft Acute MI Revascularization;  Surgeon: Nelva Bush, MD;  Location: [facility];  Service: Cardiovascular;  Laterality: N/A;  . HERNIA REPAIR    . LEFT HEART CATH AND CORONARY ANGIOGRAPHY N/A 10/04/2020   Procedure: LEFT HEART CATH AND CORONARY ANGIOGRAPHY;  Surgeon: Nelva Bush, MD;  Location: [facility];  Service: Cardiovascular;  Laterality: N/A;   Social History:  reports that he quit smoking about 52 years ago. His smoking use included cigarettes. He has a 2.50 pack-year smoking history. He has never used smokeless tobacco. He reports current alcohol use. He reports  that he does not use drugs.  No Known Allergies Family History  Problem Relation Age of Onset  . Diabetes Mother   . Brain cancer Sister   . Melanoma Brother        Radiation Exposure  . Leukemia Brother        Agent Orange    Prior to Admission medications   Medication Sig Start Date End Date Taking? Authorizing Provider  B Complex-C (SUPER B COMPLEX PO) Take 1 tablet by mouth daily.   Yes [provider]  Cholecalciferol (VITAMIN D3) 5000 units TABS Take 5,000 Units by mouth daily.   Yes [provider]  finasteride (PROSCAR) 5 MG tablet Take 5 mg by mouth daily. 10/17/19  Yes [provider]  Magnesium 400 MG CAPS Take 400 mg by mouth daily.    Yes [provider]  nitroGLYCERIN (NITROSTAT) 0.4 MG SL tablet Place 1 tablet (0.4 mg total) under the tongue every 5 (five) minutes as needed for chest pain. 05/27/20 08/25/20 Yes Tobb, Kardie, DO  Omega-3 Fatty Acids (FISH OIL) 1360 MG CAPS Take 2 capsules by mouth daily.   Yes [provider]  oxymetazoline (AFRIN) 0.05 % nasal spray Place 1 spray into both nostrils 2 (two)  times daily as needed for congestion.   Yes [provider]  rosuvastatin (CRESTOR) 20 MG tablet Take 1 tablet (20 mg total) by mouth daily. Patient taking differently: Take 20 mg by mouth every evening. 08/03/20 11/01/20 Yes Park Liter, MD  tamsulosin (FLOMAX) 0.4 MG CAPS capsule Take 0.4 mg by mouth daily. 06/29/18  Yes [provider]  valsartan (DIOVAN) 160 MG tablet Take 1 tablet (160 mg total) by mouth daily. 09/07/20  Yes Park Liter, MD   Physical Exam:  Constitutional: Elderly male who appears to be in no acute distress at this time Vitals:   10/25/20 2136 10/26/20 0145 10/26/20 0620 10/26/20 0809  BP: 137/81  121/86 128/77  Pulse: 82 70  73  Resp: 18 15  19   Temp: 98.2 F (36.8 C) 98.4 F (36.9 C) 98.1 F (36.7 C) 98 F (36.7 C)  TempSrc: Oral Oral Oral Oral  SpO2: 96% 94%  96% 98%  Weight:   95.1 kg   Height:   6\' 1"  (1.854 m)    Eyes: PERRL, lids and conjunctivae normal ENMT: Mucous membranes are moist. Posterior pharynx clear of any exudate or lesions. .  Neck: normal, supple, no masses, no thyromegaly Respiratory: Clear to auscultation without any significant wheezes or rhonchi appreciated. Patient currently on 2 L nasal cannula oxygen with O2 saturations maintained. Able to talk in complete sentences. Cardiovascular: Regular rate and rhythm, no murmurs / rubs / gallops. No extremity edema.  Abdomen: no tenderness, no masses palpated. No hepatosplenomegaly. Bowel sounds positive.  Musculoskeletal: no clubbing / cyanosis. No joint deformity upper and lower extremities. Good ROM, no contractures. Normal muscle tone.  Skin: no rashes, lesions, ulcers. No induration Neurologic: CN 2-12 grossly intact. Sensation intact, DTR normal. Strength 5/5 in all 4.  Psychiatric: Normal judgment and insight. Alert and oriented x 3. Normal mood.   Labs on Admission:  Basic Metabolic Panel: Recent Labs  Lab 10/21/20 0016 10/22/20 0104 10/23/20 0029 10/24/20 0228 10/25/20 0530  NA 144 147* 145 145 147*  K 4.2 4.0 3.7 3.7 3.9  CL 111 114* 112* 113* 112*  CO2 15* 17* 20* 19* 22  GLUCOSE 110* 152* 144* 118* 122*  BUN 118* 99* 81* 64* 51*  CREATININE 7.24* 5.73* 4.58* 3.73* 3.28*  CALCIUM 7.8* 8.0* 8.1* 8.4* 8.5*  PHOS 8.5* 6.6* 4.7* 3.8 3.7   Liver Function Tests: Recent Labs  Lab 10/21/20 0016 10/22/20 0104 10/23/20 0029 10/24/20 0228 10/25/20 0530  ALBUMIN 2.4* 2.3* 2.2* 2.2* 2.4*   No results for input(s): LIPASE, AMYLASE in the last 168 hours. No results for input(s): AMMONIA in the last 168 hours. CBC: Recent Labs  Lab 10/21/20 0016 10/22/20 0104 10/23/20 0029 10/24/20 0228 10/25/20 0530  WBC 8.9 7.8 9.0 10.3 10.2  NEUTROABS 6.5 5.1 5.8 6.5 6.0  HGB 8.4* 8.6* 8.6* 9.4* 10.0*  HCT 25.2* 26.0* 27.1* 27.8* 32.5*  MCV 93.7 92.9 93.4 91.1 97.0   PLT 462* 461* 472* 449* 493*   Cardiac Enzymes: No results for input(s): CKTOTAL, CKMB, CKMBINDEX, TROPONINI in the last 168 hours. BNP: Invalid input(s): POCBNP CBG: Recent Labs  Lab 10/25/20 0617 10/25/20 1153 10/25/20 1625 10/25/20 2136 10/26/20 0629  GLUCAP 131* 148* 139* 100* 103*    Radiological Exams on Admission: No results found.  EKG: Independently reviewed. Sinus rhythm at 70 bpm with fusion complexes otherwise similar to previous tracings.  Time spent: >45 minutes  [facility]   If 7PM-7AM, please contact  night-coverage

## 2020-10-26 NOTE — Progress Notes (Signed)
Pt with hematuria, post I&O cath.  If needs repeat cath will leave in foley and will check CBC,  lovenox last dose was yesterday AM -- need to continue asa and brilinta

## 2020-10-26 NOTE — Progress Notes (Signed)
Inpatient Diabetes Program Recommendations  AACE/ADA: New Consensus Statement on Inpatient Glycemic Control (2015)  Target Ranges:  Prepandial:   less than 140 mg/dL      Peak postprandial:   less than 180 mg/dL (1-2 hours)      Critically ill patients:  140 - 180 mg/dL   Lab Results  Component Value Date   GLUCAP 210 (H) 10/26/2020   HGBA1C 7.0 (H) 10/06/2020    Review of Glycemic Control  Diabetes history: New-onset DM Outpatient Diabetes medications: None Current orders for Inpatient glycemic control: Amaryl 1 mg with breakfast  HgbA1C - 7%  Inpatient Diabetes Program Recommendations:     Novolog 0-6 units TID with meals  Spoke with pt and wife this morning about his new diagnosis of DM and HgbA1C of 7%. Pt states he and wife follow healthy diet at home. Discussed impact of nutrition, exercise, stress, sickness, and medications on diabetes control. Reviewed Living Well with diabetes booklet and encouraged patient to read through entire book. Told pt he might want to purchase glucose meter and check his blood sugars occasionally. Pt agrees. Will f/u with PCP for diabetes management.   Follow.  Thank you. Lorenda Peck, RD, LDN, CDE Inpatient Diabetes Coordinator [phone]

## 2020-10-26 NOTE — Progress Notes (Addendum)
Progress Note  Patient Name: Timothy Moran Date of Encounter: 10/26/2020  [facility] Cardiologist: Jenne Campus, MD   Subjective   No angina, no SOB, feeling well wanting to sit up in chair. waiitng for In pt rehab.   Inpatient Medications    Scheduled Meds: . aspirin  81 mg Oral Daily  . atorvastatin  80 mg Oral Daily  . chlorhexidine  15 mL Mouth Rinse BID  . docusate sodium  200 mg Oral Daily  . feeding supplement (NEPRO CARB STEADY)  237 mL Oral BID BM  . guaiFENesin  10 mL Oral Q4H  . hydrALAZINE  25 mg Oral Q8H  . insulin aspart  0-15 Units Subcutaneous TID WC  . insulin aspart  0-5 Units Subcutaneous QHS  . isosorbide dinitrate  30 mg Oral BID  . living well with diabetes book   Does not apply Once  . mouth rinse  15 mL Mouth Rinse q12n4p  . metoprolol succinate  50 mg Oral Daily  . multivitamin with minerals  1 tablet Oral Daily  . senna-docusate  1 tablet Oral QHS  . sodium bicarbonate  1,300 mg Oral TID  . sodium chloride flush  3 mL Intravenous Q12H  . ticagrelor  90 mg Oral BID   Continuous Infusions: . sodium chloride Stopped (10/14/20 1029)   PRN Meds: sodium chloride, acetaminophen, albuterol, diphenhydrAMINE, haloperidol lactate, LORazepam, melatonin, morphine injection, oxyCODONE, simethicone, sodium chloride flush, white petrolatum   Vital Signs    Vitals:   10/25/20 2136 10/26/20 0145 10/26/20 0620 10/26/20 0809  BP: 137/81  121/86 128/77  Pulse: 82 70  73  Resp: 18 15  19   Temp: 98.2 F (36.8 C) 98.4 F (36.9 C) 98.1 F (36.7 C) 98 F (36.7 C)  TempSrc: Oral Oral Oral Oral  SpO2: 96% 94% 96% 98%  Weight:   95.1 kg   Height:   6\' 1"  (1.854 m)     Intake/Output Summary (Last 24 hours) at 10/26/2020 0815 Last data filed at 10/25/2020 1300 Gross per 24 hour  Intake 220 ml  Output --  Net 220 ml   Last 3 Weights 10/26/2020 10/25/2020 10/24/2020  Weight (lbs) 209 lb 10.5 oz 208 lb 12.4 oz 208 lb 15.9 oz  Weight (kg) 95.1 kg  94.7 kg 94.8 kg      Telemetry    SR with occ PVC - Personally Reviewed  ECG    SR with LBBB LAD, lat twave inversions - Personally Reviewed  Physical Exam   GEN: No acute distress.   Neck: No JVD Cardiac: RRR, no murmurs, rubs, or gallops.  Respiratory: Clear to auscultation bilaterally. GI: Soft, nontender, non-distended  MS: No edema; No deformity. Neuro:  Nonfocal  Psych: Normal affect   Labs    High Sensitivity Troponin:   Recent Labs  Lab 10/04/20 1808 10/04/20 2324 10/05/20 0500  TROPONINIHS 75* >27,000* >27,000*      Chemistry Recent Labs  Lab 10/23/20 0029 10/24/20 0228 10/25/20 0530  NA 145 145 147*  K 3.7 3.7 3.9  CL 112* 113* 112*  CO2 20* 19* 22  GLUCOSE 144* 118* 122*  BUN 81* 64* 51*  CREATININE 4.58* 3.73* 3.28*  CALCIUM 8.1* 8.4* 8.5*  ALBUMIN 2.2* 2.2* 2.4*  GFRNONAA 13* 16* 19*  ANIONGAP 13 13 13      Hematology Recent Labs  Lab 10/23/20 0029 10/24/20 0228 10/25/20 0530  WBC 9.0 10.3 10.2  RBC 2.90* 3.05* 3.35*  HGB 8.6* 9.4* 10.0*  HCT  27.1* 27.8* 32.5*  MCV 93.4 91.1 97.0  MCH 29.7 30.8 29.9  MCHC 31.7 33.8 30.8  RDW 13.9 13.7 13.9  PLT 472* 449* 493*    BNPNo results for input(s): BNP, PROBNP in the last 168 hours.   DDimer No results for input(s): DDIMER in the last 168 hours.   Radiology    No results found.  Cardiac Studies   Cath 10/04/20 Conclusions: 1. Significant two-vessel coronary artery disease including thrombotic occlusion of the distal LCx, which is the culprit for the patient'sinferior STEMIas well as sequential 50% and 80% proximal/mid LAD lesions. 2. Mildly elevated left ventricular filling pressure (LVEDP 20-25 mmHg). 3. SuccessfulPCI to distal LCx using Resolute Onyx 2.5 x 30 mm drug-eluting stent complicated by no reflow treated with intracoronary adenosine and nitroglycerin, as well as aggressive antiplatelet therapy. Final angiogram shows 0% residual stenosis with TIMI-2  flow.  Recommendations: 1. Admit to 2H-ICU for post STEMI/cardiac arrestmonitoring. Consider targeted temperature management; critical care medicine has been consulted to assist with targeted temperature and ventilator management. 2. Continue tirofiban infusion for 6 hours. 3. Loaded with ticagrelor when patient arrives in ICU; patient should complete 12 months of dual antiplatelet therapy with aspirin and ticagrelor. 4. Obtain echocardiogram. 5. Aggressive secondary prevention of coronary artery disease including high intensity statin therapy. 6. Continue IV amiodarone overnight. 7. Consider staged PCI to LAD.   Echocardiogram 10/05/20 1. Since the study on 06/19/2020 LVEF has decreased, now severely impaired  at 20-25% and diffuse hypokinesis.  2. Left ventricular ejection fraction, by estimation, is 20 to 25%. The  left ventricle has severely decreased function. The left ventricle  demonstrates global hypokinesis. There is mild concentric left ventricular  hypertrophy. Left ventricular diastolic  parameters are consistent with Grade I diastolic dysfunction (impaired  relaxation).  3. Right ventricular systolic function is normal. The right ventricular  size is normal.  4. The mitral valve is normal in structure. Mild mitral valve  regurgitation. No evidence of mitral stenosis.  5. The aortic valve is normal in structure. Aortic valve regurgitation is  trivial. No aortic stenosis is present.  6. The inferior vena cava is normal in size with greater than 50%  respiratory variability, suggesting right atrial pressure of 3 mmHg.    Patient Profile     75 y.o. male a PMH of LBBB, mildly reduced LVEF, thoracic aortic aneurysm (4.2cm), HTN, multiple symptomatic meningiomas (possible NF2 suspected), 2 prior PE's no longer on anticoagulation, and CKD stage 3, who presented with out of hospital VF arrest with successful resuscitation with post resuscitation EKG revealing inferior  STEMI.   Assessment & Plan    Inferior STEMI/s/p stent to dominant LCX CAD-residual disease with sequential 50% and 80% proximal/mid LAD lesions-- has been pain free, may need PCI in future have held PCI for this reason Cardiac arrest (v fib with at least 4 shocks) out of hospital CPR 2-3 min, AED shockwave rhythm Rehab OK for Monday or when bed available.  We need to follow in in patient rehab.  --ASA Brilinta for 1 year --will ask EP to give recommendations for ICD.    Acute on chronic systolic and diastolic heart failure/ICM Avoiding ARB/ARNI.  Net negative 11 liters.  (neg 1.7 L yesterday)   Continues to auto diurese.  No indication for diuretic.  --on hydralazine 25 mg every 8 hours, isordil BID toprol XL 50 mg daily titrate up as needed for BP control --repeat echo in 40 days   AKI Creat is  mildly better today.  Much improved over the week. Now 3.28 pk was 8.02   Improvement seems to have slowed.  Avoiding all nephrotoxins.   Removed Foley on Sat   -Nephrology has signed off.  They expect renal recovery to his baseline. --on Na Bicarb  Urinary retention foley out Sat this AM unable to void and 853 ml in bladder by scan and 800 cc removed --was on flomax as outpt will add back   DM new diagnosis A1C of 7 on SSI  Continue current therapy.  will ask [facility] for recommendations going forward. They will see.  Anemia Minimize lab draws.  Please put them in as needed.   --Iron saturation 10%.  Started IV iron x 3 doses and Aranesp dose on 2/9. Monitor hemoglobin  Hypoxia/vent initially on admit Down to 2 liters.  Ambulate.    HLD on statin continue -lipitor 80   LBBB chronic  Anoxic brain injury - improving for rehab. Did have hypothermia protocol.   Hx of PEs 08/03/20 was to be on eliquis for 3 months but pt self discontinued. - we have not resumed.    Disposition to inpatient rehab when a bed is ready.     For questions or updates, please contact [place] Please consult [email or web address] for contact info under        Signed, Cecilie Kicks, NP  10/26/2020, 8:15 AM    Patient seen and examined with Cecilie Kicks NP.  Agree as above, with the following exceptions and changes as noted below. Feels well without chest pain currently. Gen: NAD, CV: distant heart sounds, RRR, no murmurs, Lungs: clear, Abd: soft, Extrem: Warm, well perfused, no edema, Neuro/Psych: alert and oriented x 3, normal mood and affect. All available labs, radiology testing, previous records reviewed.   Inferior STEMI presenting with VF arrest, dominant circumflex has been revascularized but there is a residual proximal to mid 50 to 80% LAD lesion.  He is chest pain-free but with minimal exertion in the hospital.  It will be telling when he goes to rehab what his symptoms are.  I discussed with him and his wife that if he has chest pain, and we distinguish this from pain from CPR, that would be a good indication for [place] to pursue staged PCI of the LAD lesion.  For now he will continue aspirin and Brilinta for DAPT for 1 year. I have instructed the patient that dual antiplatelet therapy should be taken for 1 year without interruption.  We have discussed the consequences of interrupted dual antiplatelet therapy and the risk for in-stent thrombosis.   He has acute on chronic systolic and diastolic heart failure and ischemic cardiomyopathy.  He had significant renal dysfunction on presentation which is slowly recovering but not yet normal.  This will impact timing of staged PCI as well.  He will need an outpatient EP consultation after 40 days of revascularization with repeat echocardiogram.  I have spoken to inpatient EP service who will arrange this follow-up.  Anticipate discharge to rehab facility, will coordinate whether that is today or tomorrow.   Elouise Munroe, MD 10/26/20 10:47 AM

## 2020-10-26 NOTE — Discharge Summary (Addendum)
Discharge Summary    Patient ID: Timothy Moran MRN: [id number]; DOB: [birth year]  Admit date: 10/04/2020 Discharge date: 10/29/2020  PCP:  Cari Caraway, [place]  Cardiologist:  Jenne Campus, MD  Advanced Practice Provider:  No care team member to display Electrophysiologist:  None [phone]    Discharge Diagnoses    Principal Problem:   Cardiac arrest with ventricular fibrillation [facility]) Active Problems:   Left bundle branch block   Personal history of pulmonary embolism   Chronic kidney disease, stage III (moderate) ([place])   Ascending aortic aneurysm ([place]) 4.2 cm based on CT from December 2020   Hypertension   STEMI (ST elevation myocardial infarction) ([place])   SOB (shortness of breath)   Acute blood loss anemia   AKI (acute kidney injury) ([place])   Acute respiratory failure ([place]), hypothermia therapy, vent - extubated 10/06/20   Pneumonia of both lungs due to infectious organism   Ischemic cardiomyopathy   DM (diabetes mellitus), type 2 ([place]) new   Hematuria   Urinary retention   Iron deficiency anemia rec'd IV iron   Anoxic brain injury ([place])   HLD (hyperlipidemia)   CAD in native artery residual disease of LAD, may need PCI, treating medically for now   S/P angioplasty with stent 10/04/20 DES to LCX    Acute on chronic combined systolic and diastolic HF (heart failure) ([place])    Diagnostic Studies/Procedures    Cardiac cath 10/04/20 and PCI Conclusions: 1. Significant two-vessel coronary artery disease including thrombotic occlusion of the distal LCx, which is the culprit for the patient's inferior STEMI as well as sequential 50% and 80% proximal/mid LAD lesions. 2. Mildly elevated left ventricular filling pressure (LVEDP 20-25 mmHg). 3. Successful PCI to distal LCx using Resolute Onyx 2.5 x 30 mm drug-eluting stent complicated by no reflow treated with intracoronary adenosine and nitroglycerin, as well as aggressive antiplatelet  therapy. Final angiogram shows 0% residual stenosis with TIMI-2 flow.  Recommendations: 1. Admit to 2H-ICU for post STEMI/cardiac arrest monitoring. Consider targeted temperature management; critical care medicine has been consulted to assist with targeted temperature and ventilator management. 2. Continue tirofiban infusion for 6 hours. 3. Loaded with ticagrelor when patient arrives in ICU; patient should complete 12 months of dual antiplatelet therapy with aspirin and ticagrelor. 4. Obtain echocardiogram. 5. Aggressive secondary prevention of coronary artery disease including high intensity statin therapy. 6. Continue IV amiodarone overnight. 7. Consider staged PCI to LAD.  Nelva Bush, MD  ____Diagnostic Dominance: Right    Intervention    _________    Echo 10/05/20 IMPRESSIONS    1. Since the study on 06/19/2020 LVEF has decreased, now severely impaired  at 20-25% and diffuse hypokinesis.  2. Left ventricular ejection fraction, by estimation, is 20 to 25%. The  left ventricle has severely decreased function. The left ventricle  demonstrates global hypokinesis. There is mild concentric left ventricular  hypertrophy. Left ventricular diastolic  parameters are consistent with Grade I diastolic dysfunction (impaired  relaxation).  3. Right ventricular systolic function is normal. The right ventricular  size is normal.  4. The mitral valve is normal in structure. Mild mitral valve  regurgitation. No evidence of mitral stenosis.  5. The aortic valve is normal in structure. Aortic valve regurgitation is  trivial. No aortic stenosis is present.  6. The inferior vena cava is normal in size with greater than 50%  respiratory variability, suggesting right atrial pressure of 3 mmHg.   FINDINGS  Left Ventricle:  Left ventricular ejection fraction, by estimation, is 20  to 25%. The left ventricle has severely decreased function. The left  ventricle demonstrates  global hypokinesis. The left ventricular internal  cavity size was normal in size. There  is mild concentric left ventricular hypertrophy. Abnormal (paradoxical)  septal motion, consistent with left bundle branch block. Left ventricular  diastolic function could not be evaluated due to atrial fibrillation. Left  ventricular diastolic parameters  are consistent with Grade I diastolic dysfunction (impaired relaxation).  Normal left ventricular filling pressure.   Right Ventricle: The right ventricular size is normal. No increase in  right ventricular wall thickness. Right ventricular systolic function is  normal.   Left Atrium: Left atrial size was normal in size.   Right Atrium: Right atrial size was normal in size.   Pericardium: There is no evidence of pericardial effusion.   Mitral Valve: The mitral valve is normal in structure. Mild mitral valve  regurgitation. No evidence of mitral valve stenosis.   Tricuspid Valve: The tricuspid valve is normal in structure. Tricuspid  valve regurgitation is not demonstrated. No evidence of tricuspid  stenosis.   Aortic Valve: The aortic valve is normal in structure. Aortic valve  regurgitation is trivial. No aortic stenosis is present.   Pulmonic Valve: The pulmonic valve was normal in structure. Pulmonic valve  regurgitation is not visualized. No evidence of pulmonic stenosis.   Aorta: The aortic root is normal in size and structure.   Venous: The inferior vena cava is normal in size with greater than 50%  respiratory variability, suggesting right atrial pressure of 3 mmHg.   IAS/Shunts: No atrial level shunt detected by color flow Doppler.     History of Present Illness     Timothy Moran is a 75 y.o. male with hx of LBBB, mildly reduced EF, 4.2 CM TAA, multiple symptomatic meningiomas (possible NF2 suspected), CKD stage III, two prior pulmonary embolisms (now off Eliquis), HTN, and pulmonary nodules that presented to ER 10/04/20  with a resuscitated VF arrest at home and found to have inferior ST elevation on EKG.     Pt had been usual state of health and began having chest pain while shoveling snow.  The pain persisted, she called EMS and while she was on the phone he collapsed,  No bystander CPR performed but EMS arrived in 2-3 min.  He rec'd 4 defibrillations by EMS for V fib.  ROSC at 16 min.  rec'd epid and amio.  In ER SKG with ST elevation in inf and apical leads.  Troponin 75, Cr 1.8 with baseline of 1.4 to 1.5, pH 7.28.  IV amiodarone was started.  IV heparin started and rectal ASA given. He was taken emergently to cath lab. Cath found to have thrombotic occlusion of a large, dominant, distal circumflex. This was treated with Onyx 2.5 x 25mm DES. There was initially no-reflow distally, but improved with IC adenosine and nitro, as well as Aggrastat.  He was stable and admitted to ICU for hypothermic protocol and Vent therapy by CCM. No further VT/VF.     He does have residual mLAD disease with plans for staged PCI but with developing AKI over next few days this was placed on hold.    Hospital Course     Consultants: CCM for hypothermia protocol and vent management. Nephrology for acute on chronic AKI and anemia  Hospitalist for new DM     Inferior STEMI/s/p stent to dominant LCX CAD-residual disease with sequential 50% and 80%  proximal/mid LAD lesions-- has been pain free, may need PCI in future; have held PCI for due to AKI  - initially planned staged LAD PCI, now medical therapy given no chest pain and Cr of 3  Cardiac arrest (v fib with at least 4 shocks) out of hospital  AED shockwave rhythm Discharge to rehab  --ASA Brilinta for 1 year --pending 6 week repeat echo and consult with EP on 12/15/20 at 41 with Dr. Quentin Ore to decide if need ICD. --Lifevest will be fitted prior to discharge  Acute on chronic systolic and diastolic heart failure/ICM EF 20-25% Avoiding ARB/ARNI. Net negative 11liters.(neg 1.7  L yesterday) Continues to auto diurese. No indication for diuretic.  --on hydralazine 25 mg every 8 hours, isordil BID toprol XL 50 mg daily titrate up as needed for BP control --repeat echo in 6 weeks  AKI Creat is mildly better today. Much improved over the week. Now 3.01 pk was 8.02 at peak.  Improvement seems to have slowed. Avoiding all nephrotoxins. Removed Foley on Sat   -Nephrology has signed off.  They expect renal recovery to his baseline. --on Na Bicarb  Urinary retention foley out Sat this AM unable to void and 853 ml in bladder by scan and 800 cc removed --was on flomax as outpt added back  --failed voiding trial, will need to retry voiding off of foley in a few days  Hematuria developed with in and out cath, lovenox stopped continue Brilinta and ASA  DM new diagnosis A1C of 7 on SSI  Continue current therapy.triad IM gave recommendations going forward.  Per IM, "Tradjenta 5 mg oral daily will be option to discharge from CIR if renal function does not improve, but would continue with insulin sliding scale while hospitalized and at CIR."  Anemia Minimize lab draws. Please put them in as needed.  --Iron saturation 10%. Started IV iron x 3 doses and Aranesp dose on 2/9. Monitor hemoglobin  Hypoxia/vent initially on admit  Extubated 1/25   Down to 2 liters. Ambulate.   HLD on statin continue -lipitor 80   LBBB chronic  Anoxic brain injury - improving for rehab. Did have hypothermia protocol.   Hx of PEs 08/03/20 was to be on eliquis for 3 months but pt self discontinued. - we have not resumed.   Did the patient have an acute coronary syndrome (MI, NSTEMI, STEMI, etc) this admission?:  Yes                               AHA/ACC Clinical Performance & Quality Measures: 8. Aspirin prescribed? - Yes 9. ADP Receptor Inhibitor (Plavix/Clopidogrel, Brilinta/Ticagrelor or Effient/Prasugrel) prescribed (includes medically managed patients)? - Yes 10. Beta  Blocker prescribed? - Yes 11. High Intensity Statin (Lipitor 40-80mg  or Crestor 20-40mg ) prescribed? - Yes 12. EF assessed during THIS hospitalization? - Yes 13. For EF <40%, was ACEI/ARB prescribed? - Yes 14. For EF <40%, Aldosterone Antagonist (Spironolactone or Eplerenone) prescribed? - No - Reason:  No BP room 15. Cardiac Rehab Phase II ordered (including medically managed patients)? - Yes       _____________  Discharge Vitals Blood pressure 107/71, pulse 66, temperature 98.2 F (36.8 C), temperature source Oral, resp. rate 18, height 6\' 1"  (1.854 m), weight 93.4 kg, SpO2 91 %.  Filed Weights   10/28/20 0500 10/28/20 0506 10/29/20 0440  Weight: 92.1 kg 92.1 kg 93.4 kg   Physical Exam   GEN:No acute distress.  Neck:No JVD Cardiac:RRR, no murmurs, rubs, or gallops.  Respiratory:Clear to auscultation bilaterally. JO:ACZY, nontender, non-distended  MS:No edema; No deformity. Neuro:Nonfocal  Psych: Normal affect    Labs & Radiologic Studies    CBC Recent Labs    10/27/20 0048  WBC 9.9  HGB 9.0*  HCT 27.6*  MCV 94.2  PLT 606*   Basic Metabolic Panel Recent Labs    10/28/20 0130  NA 142  K 4.2  CL 109  CO2 20*  GLUCOSE 82  BUN 34*  CREATININE 3.01*  CALCIUM 8.4*   Liver Function Tests No results for input(s): AST, ALT, ALKPHOS, BILITOT, PROT, ALBUMIN in the last 72 hours. No results for input(s): LIPASE, AMYLASE in the last 72 hours. High Sensitivity Troponin:   Recent Labs  Lab 10/04/20 1808 10/04/20 2324 10/05/20 0500  TROPONINIHS 75* >27,000* >27,000*    BNP Invalid input(s): POCBNP D-Dimer No results for input(s): DDIMER in the last 72 hours. Hemoglobin A1C No results for input(s): HGBA1C in the last 72 hours. Fasting Lipid Panel No results for input(s): CHOL, HDL, LDLCALC, TRIG, CHOLHDL, LDLDIRECT in the last 72 hours. Thyroid Function Tests No results for input(s): TSH, T4TOTAL, T3FREE, THYROIDAB in the last 72 hours.  Invalid  input(s): FREET3 _____________  DG Chest 1 View  Result Date: 10/18/2020 CLINICAL DATA:  CHF. EXAM: CHEST  1 VIEW COMPARISON:  10/15/2020; 10/08/2018; 1/23/2 FINDINGS: Grossly unchanged enlarged cardiac silhouette and mediastinal contours with prominence of the pulmonary vasculature. Pulmonary vasculature remains indistinct with cephalization of flow. Grossly unchanged perihilar and bilateral medial basilar heterogeneous opacities. No new focal airspace opacities. No definite pleural effusion or pneumothorax. No acute osseous abnormalities. Degenerative change of the right glenohumeral and acromioclavicular joints is suspected though incompletely evaluated. IMPRESSION: Similar findings of cardiomegaly, pulmonary edema and perihilar/bibasilar atelectasis. Electronically Signed   By: Sandi Mariscal M.D.   On: 10/18/2020 12:54   DG Chest 1 View  Result Date: 10/15/2020 CLINICAL DATA:  Shortness of breath EXAM: CHEST  1 VIEW COMPARISON:  October 08, 2018 FINDINGS: The cardiomediastinal silhouette is unchanged and enlarged in contour. The enteric tube courses through the chest to the abdomen beyond the field-of-view. No pleural effusion. No pneumothorax. Decreased bibasilar heterogeneous opacities. Mild residual raises Duke lower nodular opacities. Visualized abdomen is unremarkable. Multilevel degenerative changes of the thoracic spine. IMPRESSION: Persistent but decreased bibasilar heterogeneous opacities. Electronically Signed   By: Valentino Saxon MD   On: 10/15/2020 12:45   DG Chest 1 View  Result Date: 10/04/2020 CLINICAL DATA:  Central line placement EXAM: CHEST  1 VIEW COMPARISON:  10/04/2020 FINDINGS: Single frontal view of the chest demonstrates stable endotracheal tube and enteric catheter. Left subclavian central venous catheter tip overlies superior vena cava. The cardiac silhouette is enlarged but stable. Persistent central vascular congestion without consolidation, effusion, or pneumothorax.  IMPRESSION: 1. No complication after left subclavian catheter placement. 2. Stable central vascular congestion. Electronically Signed   By: Randa Ngo M.D.   On: 10/04/2020 22:54   DG Abd 1 View  Result Date: 10/04/2020 CLINICAL DATA:  Enteric catheter placement, history of cardiac arrest EXAM: ABDOMEN - 1 VIEW COMPARISON:  None. FINDINGS: Supine frontal view of the upper abdomen was obtained, excluding the lower pelvis and right flank by collimation. Enteric catheter passes below diaphragm tip and side port projecting over the gastric fundus. External defibrillator pads overlie the lower chest. Excreted contrast within the kidneys. Paucity of bowel gas. IMPRESSION: 1. Enteric catheter overlying gastric fundus. Electronically Signed  By: Randa Ngo M.D.   On: 10/04/2020 22:15   CT Head Wo Contrast  Result Date: 10/04/2020 CLINICAL DATA:  Initial evaluation for acute mental status change, history of cardiac arrest, STEMI. EXAM: CT HEAD WITHOUT CONTRAST TECHNIQUE: Contiguous axial images were obtained from the base of the skull through the vertex without intravenous contrast. COMPARISON:  Previous MRI from 07/26/2020. FINDINGS: Brain: Cerebral volume within normal limits for age. Underlying mild chronic small vessel ischemic disease. No acute intracranial hemorrhage. No acute large vessel territory infarct. Left parafalcine calcified meningioma seen at the posterior left frontoparietal region, measuring 2.7 x 2.1 cm (series 3, image 22). Finding is relatively unchanged from previous. No significant surrounding edema or mass effect. An additional on plaque meningioma overlying the anterior left frontal convexity measures approximately 3.7 x 1.1 cm, also not significantly changed. Scattered areas of internal calcification seen within this lesion as well. No significant localized edema or mass effect. No other mass lesion. No midline shift or hydrocephalus. No extra-axial fluid collection. Vascular: No  hyperdense vessel. Skull: Scalp soft tissues within normal limits.  Calvarium intact. Sinuses/Orbits: Globes and orbital soft tissues within normal limits. Scattered mucosal thickening noted within the ethmoidal air cells. Endotracheal and enteric tubes partially visualized. No mastoid effusion. Other: None. IMPRESSION: 1. No acute intracranial abnormality. 2. Stable left parafalcine and anterior left frontal meningiomas as above. No significant localized edema or mass effect. 3. Underlying mild chronic small vessel ischemic disease. Electronically Signed   By: Jeannine Boga M.D.   On: 10/04/2020 20:07   CT SOFT TISSUE NECK WO CONTRAST  Result Date: 10/15/2020 CLINICAL DATA:  Non pulsatile pharyngeal mass. EXAM: CT NECK WITHOUT CONTRAST TECHNIQUE: Multidetector CT imaging of the neck was performed following the standard protocol without intravenous contrast. COMPARISON:  None. FINDINGS: Pharynx and larynx: Mild prominence of the pharyngeal tonsil bilaterally without mass or abscess. Glottis is closed without mass. Calcification in the base of the epiglottis. NG extends into the esophagus. Salivary glands: No inflammation, mass, or stone. Thyroid: Negative Lymph nodes: No enlarged lymph nodes in the neck. Vascular: Limited vascular evaluation without intravenous contrast. Limited intracranial: Negative Visualized orbits: Negative Mastoids and visualized paranasal sinuses: Mild mucosal edema paranasal sinuses. Skeleton: Cervical spondylosis without acute abnormality. Lucency around left upper molars. Upper chest: Patchy bilateral airspace disease with small bilateral effusions. Other: None IMPRESSION: Mild prominence of the pharyngeal tonsils bilaterally without mass or abscess. Glottis is closed. Negative for soft tissue mass or adenopathy in neck Patchy airspace disease in the upper lobes bilaterally with small effusions. Findings suggestive of pneumonia. Correlate with COVID status. Electronically Signed    By: Franchot Gallo M.D.   On: 10/15/2020 16:10   CARDIAC CATHETERIZATION  Result Date: 10/04/2020 Conclusions: 1. Significant two-vessel coronary artery disease including thrombotic occlusion of the distal LCx, which is the culprit for the patient's inferior STEMI as well as sequential 50% and 80% proximal/mid LAD lesions. 2. Mildly elevated left ventricular filling pressure (LVEDP 20-25 mmHg). 3. Successful PCI to distal LCx using Resolute Onyx 2.5 x 30 mm drug-eluting stent complicated by no reflow treated with intracoronary adenosine and nitroglycerin, as well as aggressive antiplatelet therapy. Final angiogram shows 0% residual stenosis with TIMI-2 flow. Recommendations: 1. Admit to 2H-ICU for post STEMI/cardiac arrest monitoring. Consider targeted temperature management; critical care medicine has been consulted to assist with targeted temperature and ventilator management. 2. Continue tirofiban infusion for 6 hours. 3. Loaded with ticagrelor when patient arrives in ICU; patient should complete  12 months of dual antiplatelet therapy with aspirin and ticagrelor. 4. Obtain echocardiogram. 5. Aggressive secondary prevention of coronary artery disease including high intensity statin therapy. 6. Continue IV amiodarone overnight. 7. Consider staged PCI to LAD. Nelva Bush, MD [facility]   US RENAL  Result Date: 10/18/2020 CLINICAL DATA:  Acute kidney injury. EXAM: RENAL / URINARY TRACT ULTRASOUND COMPLETE COMPARISON:  May 03, 2018. FINDINGS: Right Kidney: Renal measurements: 11.4 x 6.6 x 5.4 cm = volume: 210 mL. Echogenicity within normal limits. No mass or hydronephrosis visualized. Left Kidney: Renal measurements: 12.7 x 6.4 x 6.4 cm = volume: 270 mL. Echogenicity within normal limits. No mass or hydronephrosis visualized. Bladder: Appears normal for degree of bladder distention. Other: None. IMPRESSION: Normal renal ultrasound. Electronically Signed   By: Marijo Conception M.D.   On: 10/18/2020  16:26   DG Chest Port 1 View  Result Date: 10/08/2020 CLINICAL DATA:  Shortness of breath EXAM: PORTABLE CHEST 1 VIEW COMPARISON:  October 04, 2020 FINDINGS: Endotracheal tube and nasogastric tube have been removed. Central catheter tip is in the superior vena cava slightly beyond the junction with the left innominate vein. No pneumothorax. There is airspace opacity in right mid and lower lung regions as well as in the left perihilar region. Heart is enlarged, stable, with pulmonary vascularity within normal limits. No adenopathy. No bone lesions. IMPRESSION: Multifocal airspace opacity, more on the right than the left. Appearance consistent with multifocal pneumonia. Question atypical organism pneumonia. Cardiomegaly is stable. Central catheter as described without pneumothorax. Electronically Signed   By: Lowella Grip III M.D.   On: 10/08/2020 08:01   DG Chest Port 1 View  Result Date: 10/04/2020 CLINICAL DATA:  Intubated, cardiac arrest, myocardial infarction EXAM: PORTABLE CHEST 1 VIEW COMPARISON:  10/04/2020 at 6:05 p.m. FINDINGS: Single frontal view of the chest demonstrates endotracheal tube overlying tracheal air column tip at level of thoracic inlet. Enteric catheter tip and side port project over gastric fundus. Cardiac silhouette is enlarged but stable. Central vascular congestion without airspace disease, effusion, or pneumothorax. No acute bony abnormalities. IMPRESSION: 1. Central vascular congestion without overt edema. 2. Support devices as above. Electronically Signed   By: Randa Ngo M.D.   On: 10/04/2020 22:16   DG Chest Port 1 View  Result Date: 10/04/2020 CLINICAL DATA:  75 year old male with history of cardiac arrest status post CPR. EXAM: PORTABLE CHEST 1 VIEW COMPARISON:  Chest x-ray 06/25/2020. FINDINGS: An endotracheal tube is in place with tip 5.3 cm above the carina. Nasogastric tube extending into the proximal stomach. Defibrillator pad projecting over the lower left  hemithorax. Lung volumes are low. No acute consolidative airspace disease. No pleural effusions. No pneumothorax. No evidence of pulmonary edema. Mild cardiomegaly. The patient is rotated to the right on today's exam, resulting in distortion of the mediastinal contours and reduced diagnostic sensitivity and specificity for mediastinal pathology. IMPRESSION: 1. Support apparatus, as above. 2. Mild cardiomegaly. Electronically Signed   By: Vinnie Langton M.D.   On: 10/04/2020 18:21   EEG adult  Result Date: 10/05/2020 Lora Havens, MD     10/06/2020  8:48 AM Patient Name: Timothy Moran MRN: [id number] Epilepsy Attending: Lora Havens Referring Physician/Provider: Dr Rollene Fare Date: 10/05/2020 Duration: 25.56 minutes Patient history: 75 year old man status post cardiac arrest.  EEG to evaluate for seizures. Level of alertness: Comatose AEDs during EEG study: Versed Technical aspects: This EEG study was done with scalp electrodes positioned according to the 10-20 International system  of electrode placement. Electrical activity was acquired at a sampling rate of 500Hz  and reviewed with a high frequency filter of 70Hz  and a low frequency filter of 1Hz . EEG data were recorded continuously and digitally stored. Description: EEG showed continuous generalized 3 to 5 Hz theta- delta slowing. EEG was reactive to noxious stimulation.  Hyperventilation and photic stimulation were not performed.   ABNORMALITY -Continuous slow, generalized IMPRESSION: This study is suggestive of severe diffuse encephalopathy, nonspecific etiology.  No seizures or epileptiform discharges were seen throughout the recording. Priyanka [name]   Overnight EEG with video  Result Date: 10/06/2020 Lora Havens, MD     10/06/2020 10:24 AM Patient Name: Timothy Moran MRN: [id number] Epilepsy Attending: Lora Havens Referring Physician/Provider: Dr Rollene Fare Duration: 10/05/2020 8828 to 10/06/2020 0034  Patient history:  75 year old man status post cardiac arrest.  EEG to evaluate for seizures.  Level of alertness: Comatose  AEDs during EEG study: Versed  Technical aspects: This EEG study was done with scalp electrodes positioned according to the 10-20 International system of electrode placement. Electrical activity was acquired at a sampling rate of 500Hz  and reviewed with a high frequency filter of 70Hz  and a low frequency filter of 1Hz . EEG data were recorded continuously and digitally stored.  Description: EEG showed continuous generalized 3 to 5 Hz theta- delta slowing. EEG was reactive to noxious stimulation.  Hyperventilation and photic stimulation were not performed.    ABNORMALITY -Continuous slow, generalized  IMPRESSION: This study is suggestive of severe diffuse encephalopathy, nonspecific etiology.  No seizures or epileptiform discharges were seen throughout the recording.  Lora Havens   ECHOCARDIOGRAM COMPLETE  Result Date: 10/05/2020    ECHOCARDIOGRAM REPORT   Patient Name:   Timothy Moran Date of Exam: 10/05/2020 Medical Rec #:  [id number]          Height:       73.0 in Accession #:    [id number]         Weight:       229.9 lb Date of Birth:  1945-12-27           BSA:          2.283 m Patient Age:    38 years           BP:           121/73 mmHg Patient Gender: M                  HR:           61 bpm. Exam Location:  Inpatient Procedure: 2D Echo Indications:    cardiac arrest  History:        Patient has prior history of Echocardiogram examinations, most                 recent 06/19/2020. Chronic kidney disease, Arrythmias:LBBB and                 Ventricular Fibrillation; Signs/Symptoms:Shortness of Breath.  Sonographer:    Johny Chess Referring Phys: [place]  1. Since the study on 06/19/2020 LVEF has decreased, now severely impaired at 20-25% and diffuse hypokinesis.  2. Left ventricular ejection fraction, by estimation, is 20 to 25%. The left ventricle has severely  decreased function. The left ventricle demonstrates global hypokinesis. There is mild concentric left ventricular hypertrophy. Left ventricular diastolic  parameters are consistent with Grade I diastolic dysfunction (impaired relaxation).  3. Right ventricular systolic function is  normal. The right ventricular size is normal.  4. The mitral valve is normal in structure. Mild mitral valve regurgitation. No evidence of mitral stenosis.  5. The aortic valve is normal in structure. Aortic valve regurgitation is trivial. No aortic stenosis is present.  6. The inferior vena cava is normal in size with greater than 50% respiratory variability, suggesting right atrial pressure of 3 mmHg. FINDINGS  Left Ventricle: Left ventricular ejection fraction, by estimation, is 20 to 25%. The left ventricle has severely decreased function. The left ventricle demonstrates global hypokinesis. The left ventricular internal cavity size was normal in size. There is mild concentric left ventricular hypertrophy. Abnormal (paradoxical) septal motion, consistent with left bundle branch block. Left ventricular diastolic function could not be evaluated due to atrial fibrillation. Left ventricular diastolic parameters are consistent with Grade I diastolic dysfunction (impaired relaxation). Normal left ventricular filling pressure. Right Ventricle: The right ventricular size is normal. No increase in right ventricular wall thickness. Right ventricular systolic function is normal. Left Atrium: Left atrial size was normal in size. Right Atrium: Right atrial size was normal in size. Pericardium: There is no evidence of pericardial effusion. Mitral Valve: The mitral valve is normal in structure. Mild mitral valve regurgitation. No evidence of mitral valve stenosis. Tricuspid Valve: The tricuspid valve is normal in structure. Tricuspid valve regurgitation is not demonstrated. No evidence of tricuspid stenosis. Aortic Valve: The aortic valve is normal in  structure. Aortic valve regurgitation is trivial. No aortic stenosis is present. Pulmonic Valve: The pulmonic valve was normal in structure. Pulmonic valve regurgitation is not visualized. No evidence of pulmonic stenosis. Aorta: The aortic root is normal in size and structure. Venous: The inferior vena cava is normal in size with greater than 50% respiratory variability, suggesting right atrial pressure of 3 mmHg. IAS/Shunts: No atrial level shunt detected by color flow Doppler.  LEFT VENTRICLE PLAX 2D LVIDd:         5.20 cm  Diastology LVIDs:         4.40 cm  LV e' medial:    5.22 cm/s LV PW:         1.20 cm  LV E/e' medial:  9.6 LV IVS:        1.20 cm  LV e' lateral:   4.03 cm/s LVOT diam:     2.50 cm  LV E/e' lateral: 12.4 LV SV:         81 LV SV Index:   36 LVOT Area:     4.91 cm  RIGHT VENTRICLE RV S prime:     18.40 cm/s TAPSE (M-mode): 1.9 cm LEFT ATRIUM             Index LA diam:        3.80 cm 1.66 cm/m LA Vol (A2C):   69.3 ml 30.35 ml/m LA Vol (A4C):   51.9 ml 22.73 ml/m LA Biplane Vol: 60.0 ml 26.28 ml/m  AORTIC VALVE LVOT Vmax:   78.30 cm/s LVOT Vmean:  50.400 cm/s LVOT VTI:    0.166 m  AORTA Ao Asc diam: 3.80 cm MITRAL VALVE MV Area (PHT): 2.87 cm    SHUNTS MV Decel Time: 264 msec    Systemic VTI:  0.17 m MV E velocity: 50.10 cm/s  Systemic Diam: 2.50 cm MV A velocity: 83.60 cm/s MV E/A ratio:  0.60 Ena Dawley MD Electronically signed by Ena Dawley MD Signature Date/Time: 10/05/2020/11:54:46 AM    Final    Disposition   Pt is being discharged  home today in good condition.  Follow-up Plans & Appointments     Follow-up Information    Liliane Shi, PA-C Follow up on 11/18/2020.   Specialties: Cardiology, Physician Assistant Why: 8:45AM. Earliest cardiology appt available at this time.  Contact information: [address] [address] [phone]        [place] [facility] Follow up on 12/10/2020.   Specialty: Cardiology Why:  11:00AM arrval for 11:15AM appointment to obtain repeat echo to see how strong the pumping function of heart. Contact information: [address] [place]       Vickie Epley, MD Follow up on 12/15/2020.   Specialties: Cardiology, Radiology Why: 10:00AM. Electrophysiology consult Contact information: [place] [address] [phone]              Discharge Instructions    AMB Referral to Cardiac Rehabilitation - Phase II   Complete by: As directed    Diagnosis:  Coronary Stents STEMI     After initial evaluation and assessments completed: Virtual Based Care may be provided alone or in conjunction with Phase 2 Cardiac Rehab based on patient barriers.: Yes      Discharge Medications   Allergies as of 10/29/2020   No Known Allergies     Medication List    STOP taking these medications   oxymetazoline 0.05 % nasal spray Commonly known as: AFRIN   rosuvastatin 20 MG tablet Commonly known as: CRESTOR   valsartan 160 MG tablet Commonly known as: DIOVAN     TAKE these medications   aspirin 81 MG chewable tablet Chew 1 tablet (81 mg total) by mouth daily. Start taking on: October 30, 2020   atorvastatin 80 MG tablet Commonly known as: LIPITOR Take 1 tablet (80 mg total) by mouth daily. Start taking on: October 30, 2020   finasteride 5 MG tablet Commonly known as: PROSCAR Take 5 mg by mouth daily.   Fish Oil 1360 MG Caps Take 2 capsules by mouth daily.   hydrALAZINE 25 MG tablet Commonly known as: APRESOLINE Take 1 tablet (25 mg total) by mouth every 8 (eight) hours.   isosorbide dinitrate 30 MG tablet Commonly known as: ISORDIL Take 1 tablet (30 mg total) by mouth 2 (two) times daily.   Magnesium 400 MG Caps Take 400 mg by mouth daily.   metoprolol succinate 50 MG 24 hr tablet Commonly known as: TOPROL-XL Take 1 tablet (50 mg total) by mouth daily. Take with or  immediately following a meal. Start taking on: October 30, 2020   nitroGLYCERIN 0.4 MG SL tablet Commonly known as: NITROSTAT Place 1 tablet (0.4 mg total) under the tongue every 5 (five) minutes as needed for chest pain.   SUPER B COMPLEX PO Take 1 tablet by mouth daily.   tamsulosin 0.4 MG Caps capsule Commonly known as: FLOMAX Take 0.4 mg by mouth daily.   ticagrelor 90 MG Tabs tablet Commonly known as: BRILINTA Take 1 tablet (90 mg total) by mouth 2 (two) times daily.   Vitamin D3 125 MCG (5000 UT) Tabs Take 5,000 Units by mouth daily.            Durable Medical Equipment  (From admission, onward)         Start     Ordered   10/29/20 0822  For home use only DME Vest life vest  Once  Comments: 3 month, start date 10/29/2020, 150J VT, 200J VF.   10/29/20 0823             Outstanding Labs/Studies   Echo in 6 weeks  Duration of Discharge Encounter   Greater than 30 minutes including physician time.  Hilbert Corrigan, [place] 10/29/2020, 4:11 PM  Patient seen and examined with [place] PA.  Agree as above, with the following exceptions and changes as noted below.  Patient plans to be discharged to CIR today.  We are awaiting LifeVest placement.. Gen: NAD, CV: RRR, no murmurs, Lungs: clear, Abd: soft, Extrem: Warm, well perfused, no edema, Neuro/Psych: alert and oriented x 3, normal mood and affect. All available labs, radiology testing, previous records reviewed.  I discussed LifeVest indications with the patient and his wife.  Given ischemic etiology of VF arrest, I think a LifeVest until EP follow-up in 6 weeks is indicated and patient and family agree.  Please contact cardiology if we can be helpful during his CIR stay.  Elouise Munroe, MD 10/29/20 4:54 PM

## 2020-10-26 NOTE — Progress Notes (Signed)
Pt attempted to void/or have BM and leaked some blood from penis onto bed pad. Second attempt to void/have BM passed small clot and leaked some more blood from penis.  Pt worked with PT and had large BM and felt better but passed 3-4 more small clots as well as some blood.  Pt had not voided since I/O at 0700.  Pt is sitting in chair and will get another bladder scan when he is ready to return to bed. Pt resting with call bell within reach.  Will continue to monitor. Wife at bedside and encouraging pt to stay awake. Payton Emerald, RN

## 2020-10-26 NOTE — Progress Notes (Signed)
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 He began having chest pain while shoveling snow this afternoon. He collapsed and lost consciousness with no obvious injuries in the fall. No bystander CPR performed. He was found with agonal breathing and an AED-shockable rhythm. He received a total of 4 defibrillations by fire and EMS.  Patient is more alert, but remains confused with decreased awareness and safety.  Pt s/p cardiac cath with stent on 1/23 and was intubated until 1/25.    PT Comments    Pt received in supine, agreeable to therapy session and with good participation, pt demonstrating less anxiety and subjectively feeling better this date than previous session. Pt performed bed mobility with Supervision, transfers with min guard and gait in room using RW with up to minA with mild LOB but able to self-correct. Pt performed supine/seated BLE AROM therapeutic exercises with good tolerance, straight leg raise without quad lag this date. Pt continues to benefit from PT services to progress toward functional mobility goals. Continue to recommend CIR.  Follow Up Recommendations  CIR     Equipment Recommendations  Rolling walker with 5" wheels;Wheelchair (measurements PT);Wheelchair cushion (measurements PT)    Recommendations for Other Services       Precautions / Restrictions Precautions Precautions: Fall Precaution Comments: impulsive; watch RR/O2 Restrictions Weight Bearing Restrictions: No    Mobility  Bed Mobility Overal bed mobility: Needs  Assistance Bed Mobility: Supine to Sit     Supine to sit: HOB elevated;Supervision     General bed mobility comments: Extra time to perform, did not need to use bed rails this date    Transfers Overall transfer level: Needs assistance Equipment used: Rolling walker (2 wheeled) Transfers: Sit to/from Stand Sit to Stand: Min guard         General transfer comment: from EOB height to RW  Ambulation/Gait Ambulation/Gait assistance: Min Web designer (Feet): 30 Feet Assistive device: Rolling walker (2 wheeled) Gait Pattern/deviations: Step-through pattern;Decreased stride length;Drifts right/left;Wide base of support;Leaning posteriorly Gait velocity: reduced   General Gait Details: x2 mild LOB when turning but able to correct with RW and minA; gait distance limited due to pt noted to have some drips of blood from penis (pt had in/out cath earlier in day per spouse) and RN notified, per RN pt is on lovenox still   Chief Strategy Officer    Modified Rankin (Stroke Patients Only)       Balance Overall balance assessment: Needs assistance Sitting-balance support: Bilateral upper extremity supported;Feet supported Sitting balance-Leahy Scale: Poor Sitting balance - Comments: Prefers UE support at EOB, supervision for safety.   Standing balance support: Bilateral upper extremity supported;Single extremity supported Standing balance-Leahy Scale: Poor Standing balance comment: Reliant on UEs on RW and external assist, some posterior LOB but minA to correct                            Cognition Arousal/Alertness: Awake/alert Behavior During Therapy: Impulsive;WFL for tasks assessed/performed Overall  Cognitive Status: Impaired/Different from baseline Area of Impairment: Attention;Following commands;Safety/judgement;Awareness;Problem solving;Memory                   Current Attention Level: Sustained Memory: Decreased  short-term memory Following Commands: Follows one step commands consistently;Follows one step commands with increased time;Follows multi-step commands inconsistently Safety/Judgement: Decreased awareness of deficits;Decreased awareness of safety Awareness: Emergent Problem Solving: Requires verbal cues;Difficulty sequencing General Comments: calm demeanor, motivated to progress mobility/OOB      Exercises General Exercises - Lower Extremity Ankle Circles/Pumps: AROM;Both;10 reps;Supine Long Arc Quad: AROM;Both;10 reps;Seated Heel Slides: AROM;Both;10 reps;Other reps (comment) Hip ABduction/ADduction: AROM;Both;10 reps;Supine (p) Straight Leg Raises: Strengthening;Both;10 reps;Supine;AROM Hip Flexion/Marching: AROM;10 reps;Both;Seated    General Comments General comments (skin integrity, edema, etc.): increased blood clots on bed pad noted when pt stood up, RN notified. Some small drips of blood on socks when ambulating.      Pertinent Vitals/Pain Pain Assessment: Faces Faces Pain Scale: Hurts a little bit Pain Location: mild pain where in/out cath was placed overnight, some blood clots on transfer pad when pt got up, RN notified Pain Descriptors / Indicators: Discomfort;Sore Pain Intervention(s): Monitored during session;Repositioned  HR 72 to 82 bpm during mobility, SpO2 95-100% on 2L O2 [place] during mobility tasks; BP 115/68 (83) supine, BP 118/74 (86) seated, no dizziness standing and BP not further assessed.  Home Living                      Prior Function            PT Goals (current goals can now be found in the care plan section) Acute Rehab PT Goals Patient Stated Goal: to get stronger PT Goal Formulation: With patient/family Time For Goal Achievement: 10/24/20 Potential to Achieve Goals: Good Progress towards PT goals: Progressing toward goals    Frequency    Min 3X/week      PT Plan Current plan remains appropriate    Co-evaluation               AM-PAC PT "6 Clicks" Mobility   Outcome Measure  Help needed turning from your back to your side while in a flat bed without using bedrails?: A Little Help needed moving from lying on your back to sitting on the side of a flat bed without using bedrails?: A Little Help needed moving to and from a bed to a chair (including a wheelchair)?: A Little Help needed standing up from a chair using your arms (e.g., wheelchair or bedside chair)?: A Little Help needed to walk in hospital room?: A Little Help needed climbing 3-5 steps with a railing? : Total 6 Click Score: 16    End of Session Equipment Utilized During Treatment: Gait belt;Oxygen (2.5L O2 [place] on wall and 2L O2  with amb) Activity Tolerance: Patient tolerated treatment well Patient left: with call bell/phone within reach;with family/visitor present;in chair;with chair alarm set Nurse Communication: Mobility status PT Visit Diagnosis: Unsteadiness on feet (R26.81);Other abnormalities of gait and mobility (R26.89);Muscle weakness (generalized) (M62.81);Difficulty in walking, not elsewhere classified (R26.2)     Time: 1137-1205 PT Time Calculation (min) (ACUTE ONLY): 28 min  Charges:  $Gait Training: 8-22 mins $Therapeutic Exercise: 8-22 mins                     Akosua Constantine P., PTA Acute Rehabilitation Services Pager: [phone] Office: [place] 10/26/2020, 12:20 PM

## 2020-10-26 NOTE — Progress Notes (Addendum)
Nutrition Follow-up  INTERVENTION:   -Double protein portions with meals  -D/c Nepro  NUTRITION DIAGNOSIS:   Inadequate oral intake related to inability to eat as evidenced by NPO status.  Now on CHO modified diet.  GOAL:   Patient will meet greater than or equal to 90% of their needs  Progressing.  MONITOR:   Diet advancement,Labs,Weight trends,I & O's, POs  ASSESSMENT:   75 year old male who presented to the ED on 1/23 after cardiac arrest. PMH of LBBB, multiple symptomatic meningiomas, CKD stage III, 2 prior pulmonary embolisms, HTN, pulmonary nodules. Pt found to have post-cardiac arrest STEMI s/p urgent PCI of distal LCx. TTM 36 degrees initiated.  1/23 - cardiac cath, stent, V.fib arrest 1/25 - extubated 1/26 - SLP recommending NPO, transferred out of ICU, later requiring BiPAP 1/28 - gastric Cortrak placed 2/03 - diet advanced DYS 3/thins 2/04 - Cortrak pulled per provider    Pt currently consuming 60% of meals. Patient not accepting Nepro over the last 4 days. Pt doesn't like Ensure or Glucerna. No HD catheter placed d/t improving urinary output. Nephrology signed off 2/12. Plan is for pt to discharge to CIR.   Admission weight: 227 lbs. Current weight: 209 lbs.  I/Os: -11.4L since 1/31  Medications: Colace, Multivitamin with minerals daily, Senokot  Labs reviewed: CBGs: 103-210  Diet Order:   Diet Order            Diet Carb Modified Fluid consistency: Thin; Room service appropriate? Yes; Fluid restriction: Other (see comments)  Diet effective now                 EDUCATION NEEDS:   No education needs have been identified at this time  Skin:  Skin Assessment: Reviewed RN Assessment  Last BM:  2/7  Height:   Ht Readings from Last 1 Encounters:  10/26/20 6\' 1"  (1.854 m)    Weight:   Wt Readings from Last 1 Encounters:  10/26/20 95.1 kg   BMI:  Body mass index is 27.66 kg/m.  Estimated Nutritional Needs:   Kcal:   2100-2300  Protein:  115-130 grams  Fluid:  2.0 L/day  Clayton Bibles, MS, RD, LDN Inpatient Clinical Dietitian Contact information available via Amion

## 2020-10-26 NOTE — Care Management Important Message (Signed)
Important Message  Patient Details  Name: Timothy Moran MRN: [id number] Date of Birth: [birth year]   Medicare Important Message Given:  Yes     [name] 10/26/2020, 10:26 AM

## 2020-10-27 DIAGNOSIS — E119 Type 2 diabetes mellitus without complications: Secondary | ICD-10-CM

## 2020-10-27 DIAGNOSIS — I469 Cardiac arrest, cause unspecified: Secondary | ICD-10-CM | POA: Diagnosis not present

## 2020-10-27 DIAGNOSIS — R339 Retention of urine, unspecified: Secondary | ICD-10-CM

## 2020-10-27 DIAGNOSIS — I4901 Ventricular fibrillation: Secondary | ICD-10-CM | POA: Diagnosis not present

## 2020-10-27 LAB — GLUCOSE, CAPILLARY
Glucose-Capillary: 74 mg/dL (ref 70–99)
Glucose-Capillary: 93 mg/dL (ref 70–99)
Glucose-Capillary: 120 mg/dL — ABNORMAL HIGH (ref 70–99)
Glucose-Capillary: 124 mg/dL — ABNORMAL HIGH (ref 70–99)
Glucose-Capillary: 100 mg/dL — ABNORMAL HIGH (ref 70–99)

## 2020-10-27 LAB — CBC
HCT: 27.6 % — ABNORMAL LOW (ref 39.0–52.0)
Hemoglobin: 9 g/dL — ABNORMAL LOW (ref 13.0–17.0)
MCH: 30.7 pg (ref 26.0–34.0)
MCHC: 32.6 g/dL (ref 30.0–36.0)
MCV: 94.2 fL (ref 80.0–100.0)
Platelets: 443 10*3/uL — ABNORMAL HIGH (ref 150–400)
RBC: 2.93 MIL/uL — ABNORMAL LOW (ref 4.22–5.81)
RDW: 13.9 % (ref 11.5–15.5)
WBC: 9.9 10*3/uL (ref 4.0–10.5)
nRBC: 0 % (ref 0.0–0.2)

## 2020-10-27 MED ORDER — INSULIN ASPART 100 UNIT/ML ~~LOC~~ SOLN: 0.0000 [IU] | Freq: Every day | SUBCUTANEOUS | Status: DC

## 2020-10-27 MED ORDER — INSULIN ASPART 100 UNIT/ML ~~LOC~~ SOLN
0.0000 [IU] | Freq: Three times a day (TID) | SUBCUTANEOUS | Status: DC
  Administered 2020-10-27: 1 [IU] via SUBCUTANEOUS
  Administered 2020-10-28: 2 [IU] via SUBCUTANEOUS
  Administered 2020-10-28: 1 [IU] via SUBCUTANEOUS
  Administered 2020-10-29: 2 [IU] via SUBCUTANEOUS

## 2020-10-27 MED ORDER — INSULIN ASPART 100 UNIT/ML ~~LOC~~ SOLN: 0.0000 [IU] | Freq: Three times a day (TID) | SUBCUTANEOUS | Status: DC

## 2020-10-27 NOTE — Progress Notes (Addendum)
PROGRESS NOTE                                                                             PROGRESS NOTE                                                                                                                                                                                                             Patient Demographics:    Timothy Moran, is a 75 y.o. male, DOB - 12-17-1945, OXB:[id number]  Outpatient Primary MD for the patient is Cari Caraway, MD    LOS - 23  Admit date - 10/04/2020    Chief Complaint  Patient presents with  . post cpr       Brief Narrative    Timothy Moran is a 75 y.o. male with medical history significant of hypertension, congestive heart failure EF, meningiomas, history of pulmonary embolus, chronic kidney disease who had presented last month with complaints of chest pain after shoveling snow.  Patient was noted to go into cardiac arrest had inferior STEMI status post PCI with DES to distal LCx on 1/23.  Echocardiogram from 1/24 EF noted to be around 20-25% hospital course was complicated by an pneumonia and acute renal failure.  Creatinine trended up to 8.02.secondary to a cardiorenal syndrome.  Nephrology and IR had been consulted.  Hemodialysis catheter was placed, but patient creatinine started to trend down and urine output was increased. His initial glucose levels were elevated up to 230s and hemoglobin A1c was noted to be 7.  Patient was placed on sliding scale insulin during his hospitalization, but was not requiring significant amounts of insulin. Patient reports that he has never had diabetes before and would not like to give himself insulin once discharged home.   Subjective:    Timothy Moran today evidence of some urinary retention, required in and out with some hematuria, required Foley catheter insertion, no further hematuria, patient with some confusion and delirium overnight.  Denies chest pain, he  denies shortness of breath.   Assessment  &  Plan :    Principal Problem:   Cardiac arrest with ventricular fibrillation (HCC) Active Problems:   Left bundle branch block   Personal history of pulmonary embolism   Chronic kidney disease, stage III (moderate) (HCC)   Ascending aortic aneurysm (HCC) 4.2 cm based on CT from December 2020   Hypertension   STEMI (ST elevation myocardial infarction) ([place])   SOB (shortness of breath)   Acute blood loss anemia   AKI (acute kidney injury) ([place])   Acute respiratory failure ([place]), hypothermia therapy, vent - extubated 10/06/20   Pneumonia of both lungs due to infectious organism   Ischemic cardiomyopathy   DM (diabetes mellitus), type 2 ([place]) new   Hematuria   Urinary retention   Iron deficiency anemia rec'd IV iron   Anoxic brain injury ([place])   HLD (hyperlipidemia)   CAD in native artery residual disease of LAD, may need PCI, treating medically for now   S/P angioplasty with stent 10/04/20 DES to LCX    Acute on chronic combined systolic and diastolic HF (heart failure) ([place])   Diabetes mellitus type 2:  -This is a new diagnosis, with A1c of 7, CBG elevated at 230. -I will discontinue Amaryl in the setting of AKI, and creatinine of 3, due to increased risk of hypoglycemia . -He is currently hospitalized, and plan to discharge to CIR, recommendation would be to continue with sensitive insulin sliding scale while he is hospitalized, as this decrease risk of hypoglycemia , if renal function improves and go back to baseline at  time of discharge from CIR, then Metformin 500 mg oral twice daily would be best option if GFR become > 45 at time of discharge from CIR, otherwise Tradjenta 5 mg oral daily will be option to discharge from CIR if renal function does not improve, but would continue with insulin sliding scale while hospitalized and at CIR.  -Urinary retention  -Required Foley catheter insertion overnight, continue with Flomax, attempt  voiding trial in 3 days when more ambulatory .  Acute encephalopathy/hospital delirium -Patient is more confused this morning, no focal deficits, this appears to be hospital delirium, minimize narcotics, frequent orientation, out of bed to chair, increase activity with physical therapy.  ardiac arrest with inferolateral STEMI precipitate it 5 V. fib arrest: -  Patient status post V. fib arrest with multiple defibrillations. Status post PCI with drug-eluting stent to the left circumflex with plans to consider staged PCI to LAD in the future depending on renal function. Patient was placed on aspirin, Brilinta, statin, beta-blocker, and isosorbide dinitrate.  -Mangement per cardiology  Acute combined congestive heart failure/ICM: EF noted to be around 20-25%. -Per primary   Acute kidney injury superimposed on chronic kidney disease stage III:  Thought secondary to cardiorenal syndrome. Creatinine initially elevated up to 8.02. Nephrology has been consulted and placed temporary hemodialysis catheter. However, patient's urine output and creatinine started to trend down last creatinine 3 this morning on 2/14 -Continue to monitor kidney function  Iron deficiency anemia: Hemoglobin 10 g/dL today which appears to be improving. Iron saturation was 10%. Patient has been started on IV iron infusions x3 doses and Aranesp on 2/9.   Thank you for allowing [place] to participate in patient's care, [facility] hospitalist will sign off, please reconsult [place] if any other questions arise.    SpO2: 97 % O2 Flow Rate (L/min): 3 L/min FiO2 (%): 21 %  Recent Labs  Lab 10/21/20 0016 10/22/20 0104 10/23/20 0029 10/24/20 0228 10/25/20 0530 10/26/20  0900 10/27/20 0048  WBC 8.9 7.8 9.0 10.3 10.2 10.0 9.9  PLT 462* 461* 472* 449* 493* 506* 443*  ALBUMIN 2.4* 2.3* 2.2* 2.2* 2.4*  --   --        ABG     Component Value Date/Time   PHART 7.469 (H) 10/08/2020 1720   PCO2ART 26.7 (L) 10/08/2020 1720   PO2ART 76.2  (L) 10/08/2020 1720   HCO3 19.2 (L) 10/08/2020 1720   TCO2 16 (L) 10/07/2020 2313   ACIDBASEDEF 3.9 (H) 10/08/2020 1720   O2SAT 95.5 10/08/2020 1720       Condition - stable  Family Communication  : D/W daughter at bedside  Code Status :  Full  Consults  : Cardiology is primary, [facility] hospitalist consulted for diabetes management.      Lab Results  Component Value Date   PLT 443 (H) 10/27/2020    Diet :  Diet Order            Diet Carb Modified Fluid consistency: Thin; Room service appropriate? Yes; Fluid restriction: Other (see comments)  Diet effective now                  Inpatient Medications  Scheduled Meds: . aspirin  81 mg Oral Daily  . atorvastatin  80 mg Oral Daily  . chlorhexidine  15 mL Mouth Rinse BID  . Chlorhexidine Gluconate Cloth  6 each Topical Daily  . docusate sodium  200 mg Oral Daily  . glimepiride  1 mg Oral Q breakfast  . guaiFENesin  10 mL Oral Q4H  . hydrALAZINE  25 mg Oral Q8H  . isosorbide dinitrate  30 mg Oral BID  . living well with diabetes book   Does not apply Once  . living well with diabetes book   Does not apply Once  . mouth rinse  15 mL Mouth Rinse q12n4p  . metoprolol succinate  50 mg Oral Daily  . multivitamin with minerals  1 tablet Oral Daily  . senna-docusate  1 tablet Oral QHS  . sodium bicarbonate  1,300 mg Oral TID  . sodium chloride flush  3 mL Intravenous Q12H  . tamsulosin  0.4 mg Oral QPC supper  . ticagrelor  90 mg Oral BID   Continuous Infusions: . sodium chloride Stopped (10/14/20 1029)   PRN Meds:.sodium chloride, acetaminophen, albuterol, diphenhydrAMINE, haloperidol lactate, LORazepam, melatonin, morphine injection, oxyCODONE, simethicone, sodium chloride flush, white petrolatum  Antibiotics  :    Anti-infectives (From admission, onward)   Start     Dose/Rate Route Frequency Ordered Stop   10/20/20 1530  ceFAZolin (ANCEF) IVPB 2g/100 mL premix        2 g 200 mL/hr over 30 Minutes Intravenous  To Radiology 10/20/20 1135 10/21/20 1530   10/13/20 2200  ceFEPIme (MAXIPIME) 2 g in sodium chloride 0.9 % 100 mL IVPB        2 g 200 mL/hr over 30 Minutes Intravenous Every 12 hours 10/13/20 0958 10/14/20 2146   10/10/20 1000  vancomycin (VANCOREADY) IVPB 1250 mg/250 mL  Status:  Discontinued        1,250 mg 166.7 mL/hr over 90 Minutes Intravenous Every 48 hours 10/08/20 0839 10/09/20 0844   10/08/20 1015  ceFEPIme (MAXIPIME) 2 g in sodium chloride 0.9 % 100 mL IVPB  Status:  Discontinued        2 g 200 mL/hr over 30 Minutes Intravenous Every 24 hours 10/08/20 0920 10/13/20 0958   10/08/20 1000  ceFEPIme (MAXIPIME)  2 g in sodium chloride 0.9 % 100 mL IVPB  Status:  Discontinued        2 g 200 mL/hr over 30 Minutes Intravenous Every 12 hours 10/08/20 0839 10/08/20 0920   10/08/20 0930  vancomycin (VANCOREADY) IVPB 2000 mg/400 mL        2,000 mg 200 mL/hr over 120 Minutes Intravenous  Once 10/08/20 0839 10/08/20 1036       Redford Behrle M.D on 10/27/2020 at 11:47 AM  To page go to [email or web address]  [facility] -  Office  [phone]     Objective:   Vitals:   10/27/20 0316 10/27/20 0436 10/27/20 0820 10/27/20 1131  BP: 109/66  110/80 123/71  Pulse: (!) 58  71 66  Resp: 15  16 17   Temp: 98.2 F (36.8 C)  97.9 F (36.6 C) 97.7 F (36.5 C)  TempSrc: Oral  Oral Oral  SpO2: 94%  95% 97%  Weight:  95.8 kg    Height:        Wt Readings from Last 3 Encounters:  10/27/20 95.8 kg  09/22/20 103 kg  08/03/20 102.1 kg     Intake/Output Summary (Last 24 hours) at 10/27/2020 1147 Last data filed at 10/27/2020 0855 Gross per 24 hour  Intake 3 ml  Output 775 ml  Net -772 ml     Physical Exam  Awake, alert x2, confused, pleasant, following commands.  Easily distracted.  Symmetrical Chest wall movement, Good air movement bilaterally. RRR,No Gallops,Rubs or new Murmurs, No Parasternal Heave +ve B.Sounds, Abd Soft, No tenderness,  No rebound - guarding or  rigidity. No Cyanosis, Clubbing or edema, No new Rash or bruise     Data Review:    CBC Recent Labs  Lab 10/21/20 0016 10/22/20 0104 10/23/20 0029 10/24/20 0228 10/25/20 0530 10/26/20 0900 10/27/20 0048  WBC 8.9 7.8 9.0 10.3 10.2 10.0 9.9  HGB 8.4* 8.6* 8.6* 9.4* 10.0* 10.0* 9.0*  HCT 25.2* 26.0* 27.1* 27.8* 32.5* 30.6* 27.6*  PLT 462* 461* 472* 449* 493* 506* 443*  MCV 93.7 92.9 93.4 91.1 97.0 95.0 94.2  MCH 31.2 30.7 29.7 30.8 29.9 31.1 30.7  MCHC 33.3 33.1 31.7 33.8 30.8 32.7 32.6  RDW 14.3 14.1 13.9 13.7 13.9 14.0 13.9  LYMPHSABS 1.4 1.5 1.8 2.4 2.7  --   --   MONOABS 0.8 0.8 0.9 0.9 0.7  --   --   EOSABS 0.2 0.3 0.4 0.4 0.6*  --   --   BASOSABS 0.1 0.0 0.0 0.0 0.1  --   --     Recent Labs  Lab 10/21/20 0016 10/22/20 0104 10/23/20 0029 10/24/20 0228 10/25/20 0530 10/26/20 0900  NA 144 147* 145 145 147* 143  K 4.2 4.0 3.7 3.7 3.9 3.7  CL 111 114* 112* 113* 112* 109  CO2 15* 17* 20* 19* 22 21*  GLUCOSE 110* 152* 144* 118* 122* 197*  BUN 118* 99* 81* 64* 51* 41*  CREATININE 7.24* 5.73* 4.58* 3.73* 3.28* 3.09*  CALCIUM 7.8* 8.0* 8.1* 8.4* 8.5* 8.7*  ALBUMIN 2.4* 2.3* 2.2* 2.2* 2.4*  --     ------------------------------------------------------------------------------------------------------------------ No results for input(s): CHOL, HDL, LDLCALC, TRIG, CHOLHDL, LDLDIRECT in the last 72 hours.  Lab Results  Component Value Date   HGBA1C 7.0 (H) 10/06/2020   ------------------------------------------------------------------------------------------------------------------ No results for input(s): TSH, T4TOTAL, T3FREE, THYROIDAB in the last 72 hours.  Invalid input(s): FREET3  Cardiac Enzymes No results for input(s): CKMB, TROPONINI, MYOGLOBIN in the last 168 hours.  Invalid  input(s): CK ------------------------------------------------------------------------------------------------------------------    Component Value Date/Time   BNP 1,434.0 (H)  10/08/2020 1456    Micro Results No results found for this or any previous visit (from the past 240 hour(s)).  Radiology Reports DG Chest 1 View  Result Date: 10/18/2020 CLINICAL DATA:  CHF. EXAM: CHEST  1 VIEW COMPARISON:  10/15/2020; 10/08/2018; 1/23/2 FINDINGS: Grossly unchanged enlarged cardiac silhouette and mediastinal contours with prominence of the pulmonary vasculature. Pulmonary vasculature remains indistinct with cephalization of flow. Grossly unchanged perihilar and bilateral medial basilar heterogeneous opacities. No new focal airspace opacities. No definite pleural effusion or pneumothorax. No acute osseous abnormalities. Degenerative change of the right glenohumeral and acromioclavicular joints is suspected though incompletely evaluated. IMPRESSION: Similar findings of cardiomegaly, pulmonary edema and perihilar/bibasilar atelectasis. Electronically Signed   By: Sandi Mariscal M.D.   On: 10/18/2020 12:54   DG Chest 1 View  Result Date: 10/15/2020 CLINICAL DATA:  Shortness of breath EXAM: CHEST  1 VIEW COMPARISON:  October 08, 2018 FINDINGS: The cardiomediastinal silhouette is unchanged and enlarged in contour. The enteric tube courses through the chest to the abdomen beyond the field-of-view. No pleural effusion. No pneumothorax. Decreased bibasilar heterogeneous opacities. Mild residual raises Duke lower nodular opacities. Visualized abdomen is unremarkable. Multilevel degenerative changes of the thoracic spine. IMPRESSION: Persistent but decreased bibasilar heterogeneous opacities. Electronically Signed   By: Valentino Saxon MD   On: 10/15/2020 12:45   DG Chest 1 View  Result Date: 10/04/2020 CLINICAL DATA:  Central line placement EXAM: CHEST  1 VIEW COMPARISON:  10/04/2020 FINDINGS: Single frontal view of the chest demonstrates stable endotracheal tube and enteric catheter. Left subclavian central venous catheter tip overlies superior vena cava. The cardiac silhouette is enlarged but  stable. Persistent central vascular congestion without consolidation, effusion, or pneumothorax. IMPRESSION: 1. No complication after left subclavian catheter placement. 2. Stable central vascular congestion. Electronically Signed   By: Randa Ngo M.D.   On: 10/04/2020 22:54   DG Abd 1 View  Result Date: 10/04/2020 CLINICAL DATA:  Enteric catheter placement, history of cardiac arrest EXAM: ABDOMEN - 1 VIEW COMPARISON:  None. FINDINGS: Supine frontal view of the upper abdomen was obtained, excluding the lower pelvis and right flank by collimation. Enteric catheter passes below diaphragm tip and side port projecting over the gastric fundus. External defibrillator pads overlie the lower chest. Excreted contrast within the kidneys. Paucity of bowel gas. IMPRESSION: 1. Enteric catheter overlying gastric fundus. Electronically Signed   By: Randa Ngo M.D.   On: 10/04/2020 22:15   CT Head Wo Contrast  Result Date: 10/04/2020 CLINICAL DATA:  Initial evaluation for acute mental status change, history of cardiac arrest, STEMI. EXAM: CT HEAD WITHOUT CONTRAST TECHNIQUE: Contiguous axial images were obtained from the base of the skull through the vertex without intravenous contrast. COMPARISON:  Previous MRI from 07/26/2020. FINDINGS: Brain: Cerebral volume within normal limits for age. Underlying mild chronic small vessel ischemic disease. No acute intracranial hemorrhage. No acute large vessel territory infarct. Left parafalcine calcified meningioma seen at the posterior left frontoparietal region, measuring 2.7 x 2.1 cm (series 3, image 22). Finding is relatively unchanged from previous. No significant surrounding edema or mass effect. An additional on plaque meningioma overlying the anterior left frontal convexity measures approximately 3.7 x 1.1 cm, also not significantly changed. Scattered areas of internal calcification seen within this lesion as well. No significant localized edema or mass effect. No  other mass lesion. No midline shift or hydrocephalus. No extra-axial fluid collection.  Vascular: No hyperdense vessel. Skull: Scalp soft tissues within normal limits.  Calvarium intact. Sinuses/Orbits: Globes and orbital soft tissues within normal limits. Scattered mucosal thickening noted within the ethmoidal air cells. Endotracheal and enteric tubes partially visualized. No mastoid effusion. Other: None. IMPRESSION: 1. No acute intracranial abnormality. 2. Stable left parafalcine and anterior left frontal meningiomas as above. No significant localized edema or mass effect. 3. Underlying mild chronic small vessel ischemic disease. Electronically Signed   By: Jeannine Boga M.D.   On: 10/04/2020 20:07   CT SOFT TISSUE NECK WO CONTRAST  Result Date: 10/15/2020 CLINICAL DATA:  Non pulsatile pharyngeal mass. EXAM: CT NECK WITHOUT CONTRAST TECHNIQUE: Multidetector CT imaging of the neck was performed following the standard protocol without intravenous contrast. COMPARISON:  None. FINDINGS: Pharynx and larynx: Mild prominence of the pharyngeal tonsil bilaterally without mass or abscess. Glottis is closed without mass. Calcification in the base of the epiglottis. NG extends into the esophagus. Salivary glands: No inflammation, mass, or stone. Thyroid: Negative Lymph nodes: No enlarged lymph nodes in the neck. Vascular: Limited vascular evaluation without intravenous contrast. Limited intracranial: Negative Visualized orbits: Negative Mastoids and visualized paranasal sinuses: Mild mucosal edema paranasal sinuses. Skeleton: Cervical spondylosis without acute abnormality. Lucency around left upper molars. Upper chest: Patchy bilateral airspace disease with small bilateral effusions. Other: None IMPRESSION: Mild prominence of the pharyngeal tonsils bilaterally without mass or abscess. Glottis is closed. Negative for soft tissue mass or adenopathy in neck Patchy airspace disease in the upper lobes bilaterally with  small effusions. Findings suggestive of pneumonia. Correlate with COVID status. Electronically Signed   By: Franchot Gallo M.D.   On: 10/15/2020 16:10   CARDIAC CATHETERIZATION  Result Date: 10/04/2020 Conclusions: 1. Significant two-vessel coronary artery disease including thrombotic occlusion of the distal LCx, which is the culprit for the patient's inferior STEMI as well as sequential 50% and 80% proximal/mid LAD lesions. 2. Mildly elevated left ventricular filling pressure (LVEDP 20-25 mmHg). 3. Successful PCI to distal LCx using Resolute Onyx 2.5 x 30 mm drug-eluting stent complicated by no reflow treated with intracoronary adenosine and nitroglycerin, as well as aggressive antiplatelet therapy. Final angiogram shows 0% residual stenosis with TIMI-2 flow. Recommendations: 1. Admit to 2H-ICU for post STEMI/cardiac arrest monitoring. Consider targeted temperature management; critical care medicine has been consulted to assist with targeted temperature and ventilator management. 2. Continue tirofiban infusion for 6 hours. 3. Loaded with ticagrelor when patient arrives in ICU; patient should complete 12 months of dual antiplatelet therapy with aspirin and ticagrelor. 4. Obtain echocardiogram. 5. Aggressive secondary prevention of coronary artery disease including high intensity statin therapy. 6. Continue IV amiodarone overnight. 7. Consider staged PCI to LAD. Nelva Bush, MD [facility]   US RENAL  Result Date: 10/18/2020 CLINICAL DATA:  Acute kidney injury. EXAM: RENAL / URINARY TRACT ULTRASOUND COMPLETE COMPARISON:  May 03, 2018. FINDINGS: Right Kidney: Renal measurements: 11.4 x 6.6 x 5.4 cm = volume: 210 mL. Echogenicity within normal limits. No mass or hydronephrosis visualized. Left Kidney: Renal measurements: 12.7 x 6.4 x 6.4 cm = volume: 270 mL. Echogenicity within normal limits. No mass or hydronephrosis visualized. Bladder: Appears normal for degree of bladder distention. Other: None.  IMPRESSION: Normal renal ultrasound. Electronically Signed   By: Marijo Conception M.D.   On: 10/18/2020 16:26   DG Chest Port 1 View  Result Date: 10/08/2020 CLINICAL DATA:  Shortness of breath EXAM: PORTABLE CHEST 1 VIEW COMPARISON:  October 04, 2020 FINDINGS: Endotracheal tube and  nasogastric tube have been removed. Central catheter tip is in the superior vena cava slightly beyond the junction with the left innominate vein. No pneumothorax. There is airspace opacity in right mid and lower lung regions as well as in the left perihilar region. Heart is enlarged, stable, with pulmonary vascularity within normal limits. No adenopathy. No bone lesions. IMPRESSION: Multifocal airspace opacity, more on the right than the left. Appearance consistent with multifocal pneumonia. Question atypical organism pneumonia. Cardiomegaly is stable. Central catheter as described without pneumothorax. Electronically Signed   By: Lowella Grip III M.D.   On: 10/08/2020 08:01   DG Chest Port 1 View  Result Date: 10/04/2020 CLINICAL DATA:  Intubated, cardiac arrest, myocardial infarction EXAM: PORTABLE CHEST 1 VIEW COMPARISON:  10/04/2020 at 6:05 p.m. FINDINGS: Single frontal view of the chest demonstrates endotracheal tube overlying tracheal air column tip at level of thoracic inlet. Enteric catheter tip and side port project over gastric fundus. Cardiac silhouette is enlarged but stable. Central vascular congestion without airspace disease, effusion, or pneumothorax. No acute bony abnormalities. IMPRESSION: 1. Central vascular congestion without overt edema. 2. Support devices as above. Electronically Signed   By: Randa Ngo M.D.   On: 10/04/2020 22:16   DG Chest Port 1 View  Result Date: 10/04/2020 CLINICAL DATA:  75 year old male with history of cardiac arrest status post CPR. EXAM: PORTABLE CHEST 1 VIEW COMPARISON:  Chest x-ray 06/25/2020. FINDINGS: An endotracheal tube is in place with tip 5.3 cm above the carina.  Nasogastric tube extending into the proximal stomach. Defibrillator pad projecting over the lower left hemithorax. Lung volumes are low. No acute consolidative airspace disease. No pleural effusions. No pneumothorax. No evidence of pulmonary edema. Mild cardiomegaly. The patient is rotated to the right on today's exam, resulting in distortion of the mediastinal contours and reduced diagnostic sensitivity and specificity for mediastinal pathology. IMPRESSION: 1. Support apparatus, as above. 2. Mild cardiomegaly. Electronically Signed   By: Vinnie Langton M.D.   On: 10/04/2020 18:21   EEG adult  Result Date: 10/05/2020 Lora Havens, MD     10/06/2020  8:48 AM Patient Name: Joeangel Jeanpaul MRN: [id number] Epilepsy Attending: Lora Havens Referring Physician/Provider: Dr Rollene Fare Date: 10/05/2020 Duration: 25.56 minutes Patient history: 75 year old man status post cardiac arrest.  EEG to evaluate for seizures. Level of alertness: Comatose AEDs during EEG study: Versed Technical aspects: This EEG study was done with scalp electrodes positioned according to the 10-20 International system of electrode placement. Electrical activity was acquired at a sampling rate of 500Hz  and reviewed with a high frequency filter of 70Hz  and a low frequency filter of 1Hz . EEG data were recorded continuously and digitally stored. Description: EEG showed continuous generalized 3 to 5 Hz theta- delta slowing. EEG was reactive to noxious stimulation.  Hyperventilation and photic stimulation were not performed.   ABNORMALITY -Continuous slow, generalized IMPRESSION: This study is suggestive of severe diffuse encephalopathy, nonspecific etiology.  No seizures or epileptiform discharges were seen throughout the recording. Priyanka [name]   Overnight EEG with video  Result Date: 10/06/2020 Lora Havens, MD     10/06/2020 10:24 AM Patient Name: Doctor Sheahan MRN: [id number] Epilepsy Attending: Lora Havens  Referring Physician/Provider: Dr Rollene Fare Duration: 10/05/2020 9629 to 10/06/2020 5284  Patient history: 75 year old man status post cardiac arrest.  EEG to evaluate for seizures.  Level of alertness: Comatose  AEDs during EEG study: Versed  Technical aspects: This EEG study was done with scalp electrodes positioned  according to the 10-20 International system of electrode placement. Electrical activity was acquired at a sampling rate of 500Hz  and reviewed with a high frequency filter of 70Hz  and a low frequency filter of 1Hz . EEG data were recorded continuously and digitally stored.  Description: EEG showed continuous generalized 3 to 5 Hz theta- delta slowing. EEG was reactive to noxious stimulation.  Hyperventilation and photic stimulation were not performed.    ABNORMALITY -Continuous slow, generalized  IMPRESSION: This study is suggestive of severe diffuse encephalopathy, nonspecific etiology.  No seizures or epileptiform discharges were seen throughout the recording.  Lora Havens   ECHOCARDIOGRAM COMPLETE  Result Date: 10/05/2020    ECHOCARDIOGRAM REPORT   Patient Name:   JAHBARI REPINSKI Date of Exam: 10/05/2020 Medical Rec #:  [id number]          Height:       73.0 in Accession #:    [id number]         Weight:       229.9 lb Date of Birth:  [birth year]           BSA:          2.283 m Patient Age:    33 years           BP:           121/73 mmHg Patient Gender: M                  HR:           61 bpm. Exam Location:  Inpatient Procedure: 2D Echo Indications:    cardiac arrest  History:        Patient has prior history of Echocardiogram examinations, most                 recent 06/19/2020. Chronic kidney disease, Arrythmias:LBBB and                 Ventricular Fibrillation; Signs/Symptoms:Shortness of Breath.  Sonographer:    Johny Chess Referring Phys: [place]  1. Since the study on 06/19/2020 LVEF has decreased, now severely impaired at 20-25% and diffuse  hypokinesis.  2. Left ventricular ejection fraction, by estimation, is 20 to 25%. The left ventricle has severely decreased function. The left ventricle demonstrates global hypokinesis. There is mild concentric left ventricular hypertrophy. Left ventricular diastolic  parameters are consistent with Grade I diastolic dysfunction (impaired relaxation).  3. Right ventricular systolic function is normal. The right ventricular size is normal.  4. The mitral valve is normal in structure. Mild mitral valve regurgitation. No evidence of mitral stenosis.  5. The aortic valve is normal in structure. Aortic valve regurgitation is trivial. No aortic stenosis is present.  6. The inferior vena cava is normal in size with greater than 50% respiratory variability, suggesting right atrial pressure of 3 mmHg. FINDINGS  Left Ventricle: Left ventricular ejection fraction, by estimation, is 20 to 25%. The left ventricle has severely decreased function. The left ventricle demonstrates global hypokinesis. The left ventricular internal cavity size was normal in size. There is mild concentric left ventricular hypertrophy. Abnormal (paradoxical) septal motion, consistent with left bundle branch block. Left ventricular diastolic function could not be evaluated due to atrial fibrillation. Left ventricular diastolic parameters are consistent with Grade I diastolic dysfunction (impaired relaxation). Normal left ventricular filling pressure. Right Ventricle: The right ventricular size is normal. No increase in right ventricular wall thickness. Right ventricular systolic function is normal. Left Atrium: Left atrial  size was normal in size. Right Atrium: Right atrial size was normal in size. Pericardium: There is no evidence of pericardial effusion. Mitral Valve: The mitral valve is normal in structure. Mild mitral valve regurgitation. No evidence of mitral valve stenosis. Tricuspid Valve: The tricuspid valve is normal in structure. Tricuspid valve  regurgitation is not demonstrated. No evidence of tricuspid stenosis. Aortic Valve: The aortic valve is normal in structure. Aortic valve regurgitation is trivial. No aortic stenosis is present. Pulmonic Valve: The pulmonic valve was normal in structure. Pulmonic valve regurgitation is not visualized. No evidence of pulmonic stenosis. Aorta: The aortic root is normal in size and structure. Venous: The inferior vena cava is normal in size with greater than 50% respiratory variability, suggesting right atrial pressure of 3 mmHg. IAS/Shunts: No atrial level shunt detected by color flow Doppler.  LEFT VENTRICLE PLAX 2D LVIDd:         5.20 cm  Diastology LVIDs:         4.40 cm  LV e' medial:    5.22 cm/s LV PW:         1.20 cm  LV E/e' medial:  9.6 LV IVS:        1.20 cm  LV e' lateral:   4.03 cm/s LVOT diam:     2.50 cm  LV E/e' lateral: 12.4 LV SV:         81 LV SV Index:   36 LVOT Area:     4.91 cm  RIGHT VENTRICLE RV S prime:     18.40 cm/s TAPSE (M-mode): 1.9 cm LEFT ATRIUM             Index LA diam:        3.80 cm 1.66 cm/m LA Vol (A2C):   69.3 ml 30.35 ml/m LA Vol (A4C):   51.9 ml 22.73 ml/m LA Biplane Vol: 60.0 ml 26.28 ml/m  AORTIC VALVE LVOT Vmax:   78.30 cm/s LVOT Vmean:  50.400 cm/s LVOT VTI:    0.166 m  AORTA Ao Asc diam: 3.80 cm MITRAL VALVE MV Area (PHT): 2.87 cm    SHUNTS MV Decel Time: 264 msec    Systemic VTI:  0.17 m MV E velocity: 50.10 cm/s  Systemic Diam: 2.50 cm MV A velocity: 83.60 cm/s MV E/A ratio:  0.60 Ena Dawley MD Electronically signed by Ena Dawley MD Signature Date/Time: 10/05/2020/11:54:46 AM    Final

## 2020-10-27 NOTE — Progress Notes (Signed)
Inpatient Rehabilitation Admissions Coordinator  I met with patient and his daughter at bedside. I wait bed availability to admit him to [facility]. They are in agreement. His dizziness they note is a chronic issue.  Danne Baxter, RN, MSN Rehab Admissions Coordinator [phone] 10/27/2020 12:30 PM

## 2020-10-27 NOTE — Progress Notes (Addendum)
Progress Note  Patient Name: Timothy Moran Date of Encounter: 10/27/2020  [facility] Cardiologist: Jenne Campus, MD   Subjective   Woke up w/ SOB while sleeping, now on O2, snores, may have OSA, not diagnosed  Inpatient Medications    Scheduled Meds: . aspirin  81 mg Oral Daily  . atorvastatin  80 mg Oral Daily  . chlorhexidine  15 mL Mouth Rinse BID  . Chlorhexidine Gluconate Cloth  6 each Topical Daily  . docusate sodium  200 mg Oral Daily  . guaiFENesin  10 mL Oral Q4H  . hydrALAZINE  25 mg Oral Q8H  . insulin aspart  0-5 Units Subcutaneous QHS  . insulin aspart  0-9 Units Subcutaneous TID WC  . isosorbide dinitrate  30 mg Oral BID  . living well with diabetes book   Does not apply Once  . living well with diabetes book   Does not apply Once  . mouth rinse  15 mL Mouth Rinse q12n4p  . metoprolol succinate  50 mg Oral Daily  . multivitamin with minerals  1 tablet Oral Daily  . senna-docusate  1 tablet Oral QHS  . sodium bicarbonate  1,300 mg Oral TID  . sodium chloride flush  3 mL Intravenous Q12H  . tamsulosin  0.4 mg Oral QPC supper  . ticagrelor  90 mg Oral BID   Continuous Infusions: . sodium chloride Stopped (10/14/20 1029)   PRN Meds: sodium chloride, acetaminophen, albuterol, diphenhydrAMINE, haloperidol lactate, LORazepam, melatonin, morphine injection, oxyCODONE, simethicone, sodium chloride flush, white petrolatum   Vital Signs    Vitals:   10/27/20 0316 10/27/20 0436 10/27/20 0820 10/27/20 1131  BP: 109/66  110/80 123/71  Pulse: (!) 58  71 66  Resp: 15  16 17   Temp: 98.2 F (36.8 C)  97.9 F (36.6 C) 97.7 F (36.5 C)  TempSrc: Oral  Oral Oral  SpO2: 94%  95% 97%  Weight:  95.8 kg    Height:        Intake/Output Summary (Last 24 hours) at 10/27/2020 1306 Last data filed at 10/27/2020 0855 Gross per 24 hour  Intake 3 ml  Output 775 ml  Net -772 ml   Last 3 Weights 10/27/2020 10/26/2020 10/25/2020  Weight (lbs) 211 lb 3.2 oz 209  lb 10.5 oz 208 lb 12.4 oz  Weight (kg) 95.8 kg 95.1 kg 94.7 kg      Telemetry   SR, SB, occ PVCs - Personally Reviewed  ECG    None today - Personally Reviewed  Physical Exam   GEN: No acute distress.   Neck: minimal JVD Cardiac: RRR, no murmur, no rubs, or gallops.  Respiratory: diminished to auscultation bilaterally  GI: Soft, nontender, non-distended  MS: No edema; No deformity. Neuro:  Nonfocal  Psych: Normal affect    Labs    High Sensitivity Troponin:   Recent Labs  Lab 10/04/20 1808 10/04/20 2324 10/05/20 0500  TROPONINIHS 75* >27,000* >27,000*      Chemistry Recent Labs  Lab 10/23/20 0029 10/24/20 0228 10/25/20 0530 10/26/20 0900  NA 145 145 147* 143  K 3.7 3.7 3.9 3.7  CL 112* 113* 112* 109  CO2 20* 19* 22 21*  GLUCOSE 144* 118* 122* 197*  BUN 81* 64* 51* 41*  CREATININE 4.58* 3.73* 3.28* 3.09*  CALCIUM 8.1* 8.4* 8.5* 8.7*  ALBUMIN 2.2* 2.2* 2.4*  --   GFRNONAA 13* 16* 19* 20*  ANIONGAP 13 13 13 13      Hematology Recent Labs  Lab 10/25/20 0530 10/26/20 0900 10/27/20 0048  WBC 10.2 10.0 9.9  RBC 3.35* 3.22* 2.93*  HGB 10.0* 10.0* 9.0*  HCT 32.5* 30.6* 27.6*  MCV 97.0 95.0 94.2  MCH 29.9 31.1 30.7  MCHC 30.8 32.7 32.6  RDW 13.9 14.0 13.9  PLT 493* 506* 443*   Lab Results  Component Value Date   IRON 26 (L) 10/19/2020   TIBC 249 (L) 10/19/2020   FERRITIN 633 (H) 10/19/2020    BNPNo results for input(s): BNP, PROBNP in the last 168 hours.   DDimer No results for input(s): DDIMER in the last 168 hours.   Radiology    DG Chest 1 View  Result Date: 10/18/2020 CLINICAL DATA:  CHF. EXAM: CHEST  1 VIEW COMPARISON:  10/15/2020; 10/08/2018; 1/23/2 FINDINGS: Grossly unchanged enlarged cardiac silhouette and mediastinal contours with prominence of the pulmonary vasculature. Pulmonary vasculature remains indistinct with cephalization of flow. Grossly unchanged perihilar and bilateral medial basilar heterogeneous opacities. No new focal  airspace opacities. No definite pleural effusion or pneumothorax. No acute osseous abnormalities. Degenerative change of the right glenohumeral and acromioclavicular joints is suspected though incompletely evaluated. IMPRESSION: Similar findings of cardiomegaly, pulmonary edema and perihilar/bibasilar atelectasis. Electronically Signed   By: Sandi Mariscal M.D.   On: 10/18/2020 12:54   DG Chest 1 View  Result Date: 10/15/2020 CLINICAL DATA:  Shortness of breath EXAM: CHEST  1 VIEW COMPARISON:  October 08, 2018 FINDINGS: The cardiomediastinal silhouette is unchanged and enlarged in contour. The enteric tube courses through the chest to the abdomen beyond the field-of-view. No pleural effusion. No pneumothorax. Decreased bibasilar heterogeneous opacities. Mild residual raises Duke lower nodular opacities. Visualized abdomen is unremarkable. Multilevel degenerative changes of the thoracic spine. IMPRESSION: Persistent but decreased bibasilar heterogeneous opacities. Electronically Signed   By: Valentino Saxon MD   On: 10/15/2020 12:45   DG Chest 1 View  Result Date: 10/04/2020 CLINICAL DATA:  Central line placement EXAM: CHEST  1 VIEW COMPARISON:  10/04/2020 FINDINGS: Single frontal view of the chest demonstrates stable endotracheal tube and enteric catheter. Left subclavian central venous catheter tip overlies superior vena cava. The cardiac silhouette is enlarged but stable. Persistent central vascular congestion without consolidation, effusion, or pneumothorax. IMPRESSION: 1. No complication after left subclavian catheter placement. 2. Stable central vascular congestion. Electronically Signed   By: Randa Ngo M.D.   On: 10/04/2020 22:54   DG Abd 1 View  Result Date: 10/04/2020 CLINICAL DATA:  Enteric catheter placement, history of cardiac arrest EXAM: ABDOMEN - 1 VIEW COMPARISON:  None. FINDINGS: Supine frontal view of the upper abdomen was obtained, excluding the lower pelvis and right flank by  collimation. Enteric catheter passes below diaphragm tip and side port projecting over the gastric fundus. External defibrillator pads overlie the lower chest. Excreted contrast within the kidneys. Paucity of bowel gas. IMPRESSION: 1. Enteric catheter overlying gastric fundus. Electronically Signed   By: Randa Ngo M.D.   On: 10/04/2020 22:15   CT Head Wo Contrast  Result Date: 10/04/2020 CLINICAL DATA:  Initial evaluation for acute mental status change, history of cardiac arrest, STEMI. EXAM: CT HEAD WITHOUT CONTRAST TECHNIQUE: Contiguous axial images were obtained from the base of the skull through the vertex without intravenous contrast. COMPARISON:  Previous MRI from 07/26/2020. FINDINGS: Brain: Cerebral volume within normal limits for age. Underlying mild chronic small vessel ischemic disease. No acute intracranial hemorrhage. No acute large vessel territory infarct. Left parafalcine calcified meningioma seen at the posterior left frontoparietal region, measuring 2.7  x 2.1 cm (series 3, image 22). Finding is relatively unchanged from previous. No significant surrounding edema or mass effect. An additional on plaque meningioma overlying the anterior left frontal convexity measures approximately 3.7 x 1.1 cm, also not significantly changed. Scattered areas of internal calcification seen within this lesion as well. No significant localized edema or mass effect. No other mass lesion. No midline shift or hydrocephalus. No extra-axial fluid collection. Vascular: No hyperdense vessel. Skull: Scalp soft tissues within normal limits.  Calvarium intact. Sinuses/Orbits: Globes and orbital soft tissues within normal limits. Scattered mucosal thickening noted within the ethmoidal air cells. Endotracheal and enteric tubes partially visualized. No mastoid effusion. Other: None. IMPRESSION: 1. No acute intracranial abnormality. 2. Stable left parafalcine and anterior left frontal meningiomas as above. No significant  localized edema or mass effect. 3. Underlying mild chronic small vessel ischemic disease. Electronically Signed   By: Jeannine Boga M.D.   On: 10/04/2020 20:07   CT SOFT TISSUE NECK WO CONTRAST  Result Date: 10/15/2020 CLINICAL DATA:  Non pulsatile pharyngeal mass. EXAM: CT NECK WITHOUT CONTRAST TECHNIQUE: Multidetector CT imaging of the neck was performed following the standard protocol without intravenous contrast. COMPARISON:  None. FINDINGS: Pharynx and larynx: Mild prominence of the pharyngeal tonsil bilaterally without mass or abscess. Glottis is closed without mass. Calcification in the base of the epiglottis. NG extends into the esophagus. Salivary glands: No inflammation, mass, or stone. Thyroid: Negative Lymph nodes: No enlarged lymph nodes in the neck. Vascular: Limited vascular evaluation without intravenous contrast. Limited intracranial: Negative Visualized orbits: Negative Mastoids and visualized paranasal sinuses: Mild mucosal edema paranasal sinuses. Skeleton: Cervical spondylosis without acute abnormality. Lucency around left upper molars. Upper chest: Patchy bilateral airspace disease with small bilateral effusions. Other: None IMPRESSION: Mild prominence of the pharyngeal tonsils bilaterally without mass or abscess. Glottis is closed. Negative for soft tissue mass or adenopathy in neck Patchy airspace disease in the upper lobes bilaterally with small effusions. Findings suggestive of pneumonia. Correlate with COVID status. Electronically Signed   By: Franchot Gallo M.D.   On: 10/15/2020 16:10   CARDIAC CATHETERIZATION  Result Date: 10/04/2020 Conclusions: 1. Significant two-vessel coronary artery disease including thrombotic occlusion of the distal LCx, which is the culprit for the patient's inferior STEMI as well as sequential 50% and 80% proximal/mid LAD lesions. 2. Mildly elevated left ventricular filling pressure (LVEDP 20-25 mmHg). 3. Successful PCI to distal LCx using Resolute  Onyx 2.5 x 30 mm drug-eluting stent complicated by no reflow treated with intracoronary adenosine and nitroglycerin, as well as aggressive antiplatelet therapy. Final angiogram shows 0% residual stenosis with TIMI-2 flow. Recommendations: 1. Admit to 2H-ICU for post STEMI/cardiac arrest monitoring. Consider targeted temperature management; critical care medicine has been consulted to assist with targeted temperature and ventilator management. 2. Continue tirofiban infusion for 6 hours. 3. Loaded with ticagrelor when patient arrives in ICU; patient should complete 12 months of dual antiplatelet therapy with aspirin and ticagrelor. 4. Obtain echocardiogram. 5. Aggressive secondary prevention of coronary artery disease including high intensity statin therapy. 6. Continue IV amiodarone overnight. 7. Consider staged PCI to LAD. Nelva Bush, MD [facility]   US RENAL  Result Date: 10/18/2020 CLINICAL DATA:  Acute kidney injury. EXAM: RENAL / URINARY TRACT ULTRASOUND COMPLETE COMPARISON:  May 03, 2018. FINDINGS: Right Kidney: Renal measurements: 11.4 x 6.6 x 5.4 cm = volume: 210 mL. Echogenicity within normal limits. No mass or hydronephrosis visualized. Left Kidney: Renal measurements: 12.7 x 6.4 x 6.4 cm =  volume: 270 mL. Echogenicity within normal limits. No mass or hydronephrosis visualized. Bladder: Appears normal for degree of bladder distention. Other: None. IMPRESSION: Normal renal ultrasound. Electronically Signed   By: Marijo Conception M.D.   On: 10/18/2020 16:26   DG Chest Port 1 View  Result Date: 10/08/2020 CLINICAL DATA:  Shortness of breath EXAM: PORTABLE CHEST 1 VIEW COMPARISON:  October 04, 2020 FINDINGS: Endotracheal tube and nasogastric tube have been removed. Central catheter tip is in the superior vena cava slightly beyond the junction with the left innominate vein. No pneumothorax. There is airspace opacity in right mid and lower lung regions as well as in the left perihilar region.  Heart is enlarged, stable, with pulmonary vascularity within normal limits. No adenopathy. No bone lesions. IMPRESSION: Multifocal airspace opacity, more on the right than the left. Appearance consistent with multifocal pneumonia. Question atypical organism pneumonia. Cardiomegaly is stable. Central catheter as described without pneumothorax. Electronically Signed   By: Lowella Grip III M.D.   On: 10/08/2020 08:01   DG Chest Port 1 View  Result Date: 10/04/2020 CLINICAL DATA:  Intubated, cardiac arrest, myocardial infarction EXAM: PORTABLE CHEST 1 VIEW COMPARISON:  10/04/2020 at 6:05 p.m. FINDINGS: Single frontal view of the chest demonstrates endotracheal tube overlying tracheal air column tip at level of thoracic inlet. Enteric catheter tip and side port project over gastric fundus. Cardiac silhouette is enlarged but stable. Central vascular congestion without airspace disease, effusion, or pneumothorax. No acute bony abnormalities. IMPRESSION: 1. Central vascular congestion without overt edema. 2. Support devices as above. Electronically Signed   By: Randa Ngo M.D.   On: 10/04/2020 22:16   DG Chest Port 1 View  Result Date: 10/04/2020 CLINICAL DATA:  75 year old male with history of cardiac arrest status post CPR. EXAM: PORTABLE CHEST 1 VIEW COMPARISON:  Chest x-ray 06/25/2020. FINDINGS: An endotracheal tube is in place with tip 5.3 cm above the carina. Nasogastric tube extending into the proximal stomach. Defibrillator pad projecting over the lower left hemithorax. Lung volumes are low. No acute consolidative airspace disease. No pleural effusions. No pneumothorax. No evidence of pulmonary edema. Mild cardiomegaly. The patient is rotated to the right on today's exam, resulting in distortion of the mediastinal contours and reduced diagnostic sensitivity and specificity for mediastinal pathology. IMPRESSION: 1. Support apparatus, as above. 2. Mild cardiomegaly. Electronically Signed   By: Vinnie Langton M.D.   On: 10/04/2020 18:21   EEG adult  Result Date: 10/05/2020 Lora Havens, MD     10/06/2020  8:48 AM Patient Name: Dontrel Smethers MRN: [id number] Epilepsy Attending: Lora Havens Referring Physician/Provider: Dr Rollene Fare Date: 10/05/2020 Duration: 25.56 minutes Patient history: 75 year old man status post cardiac arrest.  EEG to evaluate for seizures. Level of alertness: Comatose AEDs during EEG study: Versed Technical aspects: This EEG study was done with scalp electrodes positioned according to the 10-20 International system of electrode placement. Electrical activity was acquired at a sampling rate of 500Hz  and reviewed with a high frequency filter of 70Hz  and a low frequency filter of 1Hz . EEG data were recorded continuously and digitally stored. Description: EEG showed continuous generalized 3 to 5 Hz theta- delta slowing. EEG was reactive to noxious stimulation.  Hyperventilation and photic stimulation were not performed.   ABNORMALITY -Continuous slow, generalized IMPRESSION: This study is suggestive of severe diffuse encephalopathy, nonspecific etiology.  No seizures or epileptiform discharges were seen throughout the recording. Priyanka O Yadav   Overnight EEG with video  Result Date:  10/06/2020 Lora Havens, MD     10/06/2020 10:24 AM Patient Name: Jaspal Pultz MRN: [id number] Epilepsy Attending: Lora Havens Referring Physician/Provider: Dr Rollene Fare Duration: 10/05/2020 1856 to 10/06/2020 3149  Patient history: 75 year old man status post cardiac arrest.  EEG to evaluate for seizures.  Level of alertness: Comatose  AEDs during EEG study: Versed  Technical aspects: This EEG study was done with scalp electrodes positioned according to the 10-20 International system of electrode placement. Electrical activity was acquired at a sampling rate of 500Hz  and reviewed with a high frequency filter of 70Hz  and a low frequency filter of 1Hz . EEG data were  recorded continuously and digitally stored.  Description: EEG showed continuous generalized 3 to 5 Hz theta- delta slowing. EEG was reactive to noxious stimulation.  Hyperventilation and photic stimulation were not performed.    ABNORMALITY -Continuous slow, generalized  IMPRESSION: This study is suggestive of severe diffuse encephalopathy, nonspecific etiology.  No seizures or epileptiform discharges were seen throughout the recording.  Lora Havens   ECHOCARDIOGRAM COMPLETE  Result Date: 10/05/2020    ECHOCARDIOGRAM REPORT   Patient Name:   FAIZAN GERACI Date of Exam: 10/05/2020 Medical Rec #:  [id number]          Height:       73.0 in Accession #:    [id number]         Weight:       229.9 lb Date of Birth:  Jan 21, 1946           BSA:          2.283 m Patient Age:    65 years           BP:           121/73 mmHg Patient Gender: M                  HR:           61 bpm. Exam Location:  Inpatient Procedure: 2D Echo Indications:    cardiac arrest  History:        Patient has prior history of Echocardiogram examinations, most                 recent 06/19/2020. Chronic kidney disease, Arrythmias:LBBB and                 Ventricular Fibrillation; Signs/Symptoms:Shortness of Breath.  Sonographer:    Johny Chess Referring Phys: [place]  1. Since the study on 06/19/2020 LVEF has decreased, now severely impaired at 20-25% and diffuse hypokinesis.  2. Left ventricular ejection fraction, by estimation, is 20 to 25%. The left ventricle has severely decreased function. The left ventricle demonstrates global hypokinesis. There is mild concentric left ventricular hypertrophy. Left ventricular diastolic  parameters are consistent with Grade I diastolic dysfunction (impaired relaxation).  3. Right ventricular systolic function is normal. The right ventricular size is normal.  4. The mitral valve is normal in structure. Mild mitral valve regurgitation. No evidence of mitral stenosis.  5. The  aortic valve is normal in structure. Aortic valve regurgitation is trivial. No aortic stenosis is present.  6. The inferior vena cava is normal in size with greater than 50% respiratory variability, suggesting right atrial pressure of 3 mmHg. FINDINGS  Left Ventricle: Left ventricular ejection fraction, by estimation, is 20 to 25%. The left ventricle has severely decreased function. The left ventricle demonstrates global hypokinesis. The left ventricular internal cavity size was normal in  size. There is mild concentric left ventricular hypertrophy. Abnormal (paradoxical) septal motion, consistent with left bundle branch block. Left ventricular diastolic function could not be evaluated due to atrial fibrillation. Left ventricular diastolic parameters are consistent with Grade I diastolic dysfunction (impaired relaxation). Normal left ventricular filling pressure. Right Ventricle: The right ventricular size is normal. No increase in right ventricular wall thickness. Right ventricular systolic function is normal. Left Atrium: Left atrial size was normal in size. Right Atrium: Right atrial size was normal in size. Pericardium: There is no evidence of pericardial effusion. Mitral Valve: The mitral valve is normal in structure. Mild mitral valve regurgitation. No evidence of mitral valve stenosis. Tricuspid Valve: The tricuspid valve is normal in structure. Tricuspid valve regurgitation is not demonstrated. No evidence of tricuspid stenosis. Aortic Valve: The aortic valve is normal in structure. Aortic valve regurgitation is trivial. No aortic stenosis is present. Pulmonic Valve: The pulmonic valve was normal in structure. Pulmonic valve regurgitation is not visualized. No evidence of pulmonic stenosis. Aorta: The aortic root is normal in size and structure. Venous: The inferior vena cava is normal in size with greater than 50% respiratory variability, suggesting right atrial pressure of 3 mmHg. IAS/Shunts: No atrial  level shunt detected by color flow Doppler.  LEFT VENTRICLE PLAX 2D LVIDd:         5.20 cm  Diastology LVIDs:         4.40 cm  LV e' medial:    5.22 cm/s LV PW:         1.20 cm  LV E/e' medial:  9.6 LV IVS:        1.20 cm  LV e' lateral:   4.03 cm/s LVOT diam:     2.50 cm  LV E/e' lateral: 12.4 LV SV:         81 LV SV Index:   36 LVOT Area:     4.91 cm  RIGHT VENTRICLE RV S prime:     18.40 cm/s TAPSE (M-mode): 1.9 cm LEFT ATRIUM             Index LA diam:        3.80 cm 1.66 cm/m LA Vol (A2C):   69.3 ml 30.35 ml/m LA Vol (A4C):   51.9 ml 22.73 ml/m LA Biplane Vol: 60.0 ml 26.28 ml/m  AORTIC VALVE LVOT Vmax:   78.30 cm/s LVOT Vmean:  50.400 cm/s LVOT VTI:    0.166 m  AORTA Ao Asc diam: 3.80 cm MITRAL VALVE MV Area (PHT): 2.87 cm    SHUNTS MV Decel Time: 264 msec    Systemic VTI:  0.17 m MV E velocity: 50.10 cm/s  Systemic Diam: 2.50 cm MV A velocity: 83.60 cm/s MV E/A ratio:  0.60 Ena Dawley MD Electronically signed by Ena Dawley MD Signature Date/Time: 10/05/2020/11:54:46 AM    Final      Cardiac Studies   Cath 10/04/20 Conclusions: 1. Significant two-vessel coronary artery disease including thrombotic occlusion of the distal LCx, which is the culprit for the patient'sinferior STEMIas well as sequential 50% and 80% proximal/mid LAD lesions. 2. Mildly elevated left ventricular filling pressure (LVEDP 20-25 mmHg). 3. SuccessfulPCI to distal LCx using Resolute Onyx 2.5 x 30 mm drug-eluting stent complicated by no reflow treated with intracoronary adenosine and nitroglycerin, as well as aggressive antiplatelet therapy. Final angiogram shows 0% residual stenosis with TIMI-2 flow.  Recommendations: 1. Admit to 2H-ICU for post STEMI/cardiac arrestmonitoring. Consider targeted temperature management; critical care medicine has been consulted to assist  with targeted temperature and ventilator management. 2. Continue tirofiban infusion for 6 hours. 3. Loaded with ticagrelor when patient  arrives in ICU; patient should complete 12 months of dual antiplatelet therapy with aspirin and ticagrelor. 4. Obtain echocardiogram. 5. Aggressive secondary prevention of coronary artery disease including high intensity statin therapy. 6. Continue IV amiodarone overnight. 7. Consider staged PCI to LAD.   Echocardiogram 10/05/20 1. Since the study on 06/19/2020 LVEF has decreased, now severely impaired  at 20-25% and diffuse hypokinesis.  2. Left ventricular ejection fraction, by estimation, is 20 to 25%. The  left ventricle has severely decreased function. The left ventricle  demonstrates global hypokinesis. There is mild concentric left ventricular  hypertrophy. Left ventricular diastolic  parameters are consistent with Grade I diastolic dysfunction (impaired  relaxation).  3. Right ventricular systolic function is normal. The right ventricular  size is normal.  4. The mitral valve is normal in structure. Mild mitral valve  regurgitation. No evidence of mitral stenosis.  5. The aortic valve is normal in structure. Aortic valve regurgitation is  trivial. No aortic stenosis is present.  6. The inferior vena cava is normal in size with greater than 50%  respiratory variability, suggesting right atrial pressure of 3 mmHg.    Patient Profile     75 y.o. male a PMH of LBBB, mildly reduced LVEF, thoracic aortic aneurysm (4.2cm), HTN, multiple symptomatic meningiomas (possible NF2 suspected), 2 prior PE's no longer on anticoagulation, and CKD stage 3, who presented with out of hospital VF arrest with successful resuscitation with post resuscitation EKG revealing inferior STEMI.   Assessment & Plan    Inferior STEMI/s/p stent to dominant LCX CAD- - med rx for residual dz - would prefer he get rehab before considering PCI - on ASA, Brilinta - EF 20-25% at echo, repeat in 4-6 weeks - MD advise on Lifevest till then  Acute on chronic systolic and diastolic heart  failure/ICM - wt peak 240 lbs, nadir 208, now 211 - has not been on diuretic due to decreased renal function - continue Hydralazine, Isordil, Toprol XL at current doses as BP well-controlled  AKI - peak 8.02, now 3.09 - continue to follow - Nephrology signed off - continue oral Na bicarb  Urinary retention  - foley out but u/a to void and put back in - flomax restarted 02/14 -  After 3 more days on the Flomax, can do another voiding trial - if unsuccessful, reinsert and f/u with Urology  DM new diagnosis A1C of 7 on SSI  -   Anemia - + iron deficiency -  has had IV iron and now on Aranesp - per IM  Hypoxia/vent initially on admit - continue O2 at 2 lpm for now, may need nocturnal O2 as outpt   HLD  - continue Lipitor 80 mg qd  LBBB  - chronic  Anoxic brain injury  - s/p hypothermia protocol after arrest - improving, for CIR when bed available  Hx of PEs  - in 07/2020 - pt self-d/c'd Eliquis prior to 3 months  Plan: d/c to inpt rehab when bed available, perhaps tomorrow   For questions or updates, please contact [place] Please consult [email or web address] for contact info under        Signed, Rosaria Ferries, PA-C  10/27/2020, 1:06 PM    Patient seen and examined with Rosaria Ferries, PA-C.  Agree as above, with the following exceptions and changes as noted below.  Patient had some apnea overnight requiring oxygen, may  have undiagnosed sleep apnea, we should coordinate this as an outpatient.  Continue plans for staged PCI given renal failure and absence of angina.  Can be coordinated as an outpatient.  Gen: NAD, CV: RRR, no murmurs, Lungs: clear, Abd: soft, Extrem: Warm, well perfused, no edema, Neuro/Psych: alert and oriented x 3, normal mood and affect. All available labs, radiology testing, previous records reviewed.   VF arrest in the setting of inferior STEMI.  Continue dual antiplatelet therapy now status post PCI to the dominant circumflex.  EF is low,  will need repeat echo in 6 weeks with EP follow-up for possible secondary prevention ICD.  We will follow weights and volume status closely.  He needs nephrology follow-up as an outpatient.  Elouise Munroe, MD 10/27/20 1:43 PM

## 2020-10-27 NOTE — Progress Notes (Signed)
Patient states he no longer wants to take any of his scheduled medications because they are having a negative effect on him. Pt was asked what effects the medications were having and he stated "I'm seeing lights on the ceiling."  Attempted to educate patient.. he states he will talk it over with the doctor.  No obvious signs of distress at this time, will continue to monitor.

## 2020-10-28 LAB — GLUCOSE, CAPILLARY
Glucose-Capillary: 90 mg/dL (ref 70–99)
Glucose-Capillary: 153 mg/dL — ABNORMAL HIGH (ref 70–99)
Glucose-Capillary: 148 mg/dL — ABNORMAL HIGH (ref 70–99)
Glucose-Capillary: 108 mg/dL — ABNORMAL HIGH (ref 70–99)

## 2020-10-28 LAB — BASIC METABOLIC PANEL
Anion gap: 13 (ref 5–15)
BUN: 34 mg/dL — ABNORMAL HIGH (ref 8–23)
CO2: 20 mmol/L — ABNORMAL LOW (ref 22–32)
Calcium: 8.4 mg/dL — ABNORMAL LOW (ref 8.9–10.3)
Chloride: 109 mmol/L (ref 98–111)
Creatinine, Ser: 3.01 mg/dL — ABNORMAL HIGH (ref 0.61–1.24)
GFR, Estimated: 21 mL/min — ABNORMAL LOW (ref >60)
Glucose, Bld: 82 mg/dL (ref 70–99)
Potassium: 4.2 mmol/L (ref 3.5–5.1)
Sodium: 142 mmol/L (ref 135–145)

## 2020-10-28 NOTE — Progress Notes (Signed)
OT Cancellation Note  Patient Details Name: Harvir Patry MRN: [id number] DOB: Oct 11, 1945   Cancelled Treatment:    Reason Eval/Treat Not Completed: Fatigue/lethargy limiting ability to participate;Other (comment) RN present in room with pt reporting having just assisted pt to BR and to brush his teeth, pt now fatigued. Will check back as time allows for OT session.  Harley Alto., COTA/L Acute Rehabilitation Services [phone]   Precious Haws 10/28/2020, 8:06 AM

## 2020-10-28 NOTE — Progress Notes (Signed)
Physical Therapy Treatment Patient Details Name: Timothy Moran MRN: [id number] DOB: 03-25-1946 Today's Date: 10/28/2020    History of Present Illness Pt is 75 year old male with a history of LBBB, mildly reduced LVEF, 4.2cm TAA, multiple symptomatic meningiomas (possible NF2 suspected), CKD stage III, two prior pulmonary embolisms (now off Eliquis), HTN, and pulmonary nodules, who presented as a resuscitated VF arrest at home and was found to have inferior ST elevation on ECG. He began having chest pain while shoveling snow this afternoon. He collapsed and lost consciousness with no obvious injuries in the fall. No bystander CPR performed. He was found with agonal breathing and an AED-shockable rhythm. He received a total of 4 defibrillations by fire and EMS.  Patient is more alert, but remains confused with decreased awareness and safety.  Pt s/p cardiac cath with stent on 1/23 and was intubated until 1/25.    PT Comments    Pt admitted with above diagnosis. Pt continues to be limited by poor endurance and dizziness with activity. Pt needed several sitting rest breaks. Needs REhab for incr endurance and mobility progression. Will continue acute PT. Pt currently with functional limitations due to balance and endurance deficits. Pt will benefit from skilled PT to increase their independence and safety with mobility to allow discharge to the venue listed below.     Follow Up Recommendations  CIR     Equipment Recommendations  Rolling walker with 5" wheels;Wheelchair (measurements PT);Wheelchair cushion (measurements PT)    Recommendations for Other Services Rehab consult     Precautions / Restrictions Precautions Precautions: Fall Precaution Comments: impulsive; watch RR/O2 Restrictions Weight Bearing Restrictions: No    Mobility  Bed Mobility Overal bed mobility: Needs Assistance Bed Mobility: Supine to Sit     Supine to sit: HOB elevated;Supervision           Transfers Overall transfer level: Needs assistance Equipment used: Rolling walker (2 wheeled) Transfers: Sit to/from Stand Sit to Stand: Min guard         General transfer comment: from EOB height to RW no assisst needed.  C/o dizziness throughout treatment that limited mobility. wife States he has acoustic neuromas that cause chronic dizziness. He was supposed to go get treatment this week but is now in hospital for current illness.  Ambulation/Gait Ambulation/Gait assistance: Min assist Gait Distance (Feet): 140 Feet (30 feet, 40 feet then 70 feet) Assistive device: Rolling walker (2 wheeled) Gait Pattern/deviations: Step-through pattern;Decreased stride length;Drifts right/left;Wide base of support;Leaning posteriorly Gait velocity: reduced Gait velocity interpretation: <1.31 ft/sec, indicative of household ambulator General Gait Details: Overall steady with RW.  Had to take several standing rest breaks due to fatigue and dizziness per pt. Wife followed with chair so that pt would progress distance.   Stairs             Wheelchair Mobility    Modified Rankin (Stroke Patients Only)       Balance Overall balance assessment: Needs assistance Sitting-balance support: Bilateral upper extremity supported;Feet supported Sitting balance-Leahy Scale: Poor Sitting balance - Comments: Prefers UE support at EOB, supervision for safety.   Standing balance support: Bilateral upper extremity supported;Single extremity supported Standing balance-Leahy Scale: Poor Standing balance comment: Reliant on UEs on RW and external assist                            Cognition Arousal/Alertness: Awake/alert Behavior During Therapy: Impulsive;WFL for tasks assessed/performed Overall Cognitive Status: Impaired/Different from  baseline Area of Impairment: Attention;Following commands;Safety/judgement;Awareness;Problem solving;Memory                 Orientation Level:  Disoriented to;Time Current Attention Level: Sustained Memory: Decreased short-term memory Following Commands: Follows one step commands consistently;Follows one step commands with increased time;Follows multi-step commands inconsistently Safety/Judgement: Decreased awareness of deficits;Decreased awareness of safety Awareness: Emergent Problem Solving: Requires verbal cues;Difficulty sequencing        Exercises General Exercises - Lower Extremity Ankle Circles/Pumps: AROM;Both;10 reps;Supine Long Arc Quad: AROM;Both;10 reps;Seated Hip Flexion/Marching: AROM;10 reps;Both;Seated    General Comments        Pertinent Vitals/Pain Pain Assessment: Faces Faces Pain Scale: Hurts a little bit Pain Location: mild pain where in/out cath was placed overnight, some blood clots on transfer pad when pt got up, RN notified Pain Descriptors / Indicators: Discomfort;Sore Pain Intervention(s): Limited activity within patient's tolerance;Monitored during session;Repositioned    Home Living                      Prior Function            PT Goals (current goals can now be found in the care plan section) Acute Rehab PT Goals Patient Stated Goal: to get stronger PT Goal Formulation: With patient/family Time For Goal Achievement: 11/03/20 Potential to Achieve Goals: Good Progress towards PT goals: Progressing toward goals    Frequency    Min 3X/week      PT Plan Current plan remains appropriate    Co-evaluation              AM-PAC PT "6 Clicks" Mobility   Outcome Measure  Help needed turning from your back to your side while in a flat bed without using bedrails?: A Little Help needed moving from lying on your back to sitting on the side of a flat bed without using bedrails?: A Little Help needed moving to and from a bed to a chair (including a wheelchair)?: A Little Help needed standing up from a chair using your arms (e.g., wheelchair or bedside chair)?: A  Little Help needed to walk in hospital room?: A Little Help needed climbing 3-5 steps with a railing? : Total 6 Click Score: 16    End of Session Equipment Utilized During Treatment: Gait belt Activity Tolerance: Patient tolerated treatment well Patient left: with call bell/phone within reach;with family/visitor present;in chair;with chair alarm set Nurse Communication: Mobility status PT Visit Diagnosis: Unsteadiness on feet (R26.81);Other abnormalities of gait and mobility (R26.89);Muscle weakness (generalized) (M62.81);Difficulty in walking, not elsewhere classified (R26.2)     Time: 6440-3474 PT Time Calculation (min) (ACUTE ONLY): 24 min  Charges:  $Gait Training: 8-22 mins $Therapeutic Exercise: 8-22 mins                     Bryann Gentz W,PT Acute Rehabilitation Services Pager:  [phone]  Office:  [place] 10/28/2020, 1:16 PM

## 2020-10-28 NOTE — Progress Notes (Addendum)
Progress Note  Patient Name: Timothy Moran Date of Encounter: 10/28/2020  [facility] Cardiologist: Jenne Campus, MD   Subjective   Denies any CP or SOB.   Inpatient Medications    Scheduled Meds: . aspirin  81 mg Oral Daily  . atorvastatin  80 mg Oral Daily  . chlorhexidine  15 mL Mouth Rinse BID  . Chlorhexidine Gluconate Cloth  6 each Topical Daily  . docusate sodium  200 mg Oral Daily  . guaiFENesin  10 mL Oral Q4H  . hydrALAZINE  25 mg Oral Q8H  . insulin aspart  0-5 Units Subcutaneous QHS  . insulin aspart  0-9 Units Subcutaneous TID WC  . isosorbide dinitrate  30 mg Oral BID  . living well with diabetes book   Does not apply Once  . mouth rinse  15 mL Mouth Rinse q12n4p  . metoprolol succinate  50 mg Oral Daily  . multivitamin with minerals  1 tablet Oral Daily  . senna-docusate  1 tablet Oral QHS  . sodium bicarbonate  1,300 mg Oral TID  . sodium chloride flush  3 mL Intravenous Q12H  . tamsulosin  0.4 mg Oral QPC supper  . ticagrelor  90 mg Oral BID   Continuous Infusions: . sodium chloride Stopped (10/14/20 1029)   PRN Meds: sodium chloride, acetaminophen, albuterol, diphenhydrAMINE, haloperidol lactate, LORazepam, melatonin, morphine injection, oxyCODONE, simethicone, sodium chloride flush, white petrolatum   Vital Signs    Vitals:   10/27/20 2330 10/28/20 0500 10/28/20 0506 10/28/20 0806  BP: 107/65  116/75 125/64  Pulse: 68  69 73  Resp: 19  20 17   Temp: 98.2 F (36.8 C)  98.1 F (36.7 C) 97.8 F (36.6 C)  TempSrc: Oral  Oral Oral  SpO2: 96%  94% 97%  Weight:  92.1 kg 92.1 kg   Height:        Intake/Output Summary (Last 24 hours) at 10/28/2020 1007 Last data filed at 10/28/2020 0802 Gross per 24 hour  Intake 723 ml  Output 1250 ml  Net -527 ml   Last 3 Weights 10/28/2020 10/28/2020 10/27/2020  Weight (lbs) 203 lb 0.7 oz 203 lb 0.7 oz 211 lb 3.2 oz  Weight (kg) 92.1 kg 92.1 kg 95.8 kg      Telemetry    NSR with occasional  fast burst (looks more like artifact instead of true tachycardic episode) - Personally Reviewed  ECG    NSR without significant ST-T wave changes - Personally Reviewed  Physical Exam   GEN: No acute distress.   Neck: No JVD Cardiac: RRR, no murmurs, rubs, or gallops.  Respiratory: Clear to auscultation bilaterally. GI: Soft, nontender, non-distended  MS: No edema; No deformity. Neuro:  Nonfocal  Psych: Normal affect   Labs    High Sensitivity Troponin:   Recent Labs  Lab 10/04/20 1808 10/04/20 2324 10/05/20 0500  TROPONINIHS 75* >27,000* >27,000*      Chemistry Recent Labs  Lab 10/23/20 0029 10/24/20 0228 10/25/20 0530 10/26/20 0900 10/28/20 0130  NA 145 145 147* 143 142  K 3.7 3.7 3.9 3.7 4.2  CL 112* 113* 112* 109 109  CO2 20* 19* 22 21* 20*  GLUCOSE 144* 118* 122* 197* 82  BUN 81* 64* 51* 41* 34*  CREATININE 4.58* 3.73* 3.28* 3.09* 3.01*  CALCIUM 8.1* 8.4* 8.5* 8.7* 8.4*  ALBUMIN 2.2* 2.2* 2.4*  --   --   GFRNONAA 13* 16* 19* 20* 21*  ANIONGAP 13 13 13 13  13  Hematology Recent Labs  Lab 10/25/20 0530 10/26/20 0900 10/27/20 0048  WBC 10.2 10.0 9.9  RBC 3.35* 3.22* 2.93*  HGB 10.0* 10.0* 9.0*  HCT 32.5* 30.6* 27.6*  MCV 97.0 95.0 94.2  MCH 29.9 31.1 30.7  MCHC 30.8 32.7 32.6  RDW 13.9 14.0 13.9  PLT 493* 506* 443*    BNPNo results for input(s): BNP, PROBNP in the last 168 hours.   DDimer No results for input(s): DDIMER in the last 168 hours.   Radiology    No results found.  Cardiac Studies   Cath 10/04/2020 Conclusions: 1. Significant two-vessel coronary artery disease including thrombotic occlusion of the distal LCx, which is the culprit for the patient's inferior STEMI as well as sequential 50% and 80% proximal/mid LAD lesions. 2. Mildly elevated left ventricular filling pressure (LVEDP 20-25 mmHg). 3. Successful PCI to distal LCx using Resolute Onyx 2.5 x 30 mm drug-eluting stent complicated by no reflow treated with intracoronary  adenosine and nitroglycerin, as well as aggressive antiplatelet therapy. Final angiogram shows 0% residual stenosis with TIMI-2 flow.  Recommendations: 1. Admit to 2H-ICU for post STEMI/cardiac arrest monitoring. Consider targeted temperature management; critical care medicine has been consulted to assist with targeted temperature and ventilator management. 2. Continue tirofiban infusion for 6 hours. 3. Loaded with ticagrelor when patient arrives in ICU; patient should complete 12 months of dual antiplatelet therapy with aspirin and ticagrelor. 4. Obtain echocardiogram. 5. Aggressive secondary prevention of coronary artery disease including high intensity statin therapy. 6. Continue IV amiodarone overnight. 7. Consider staged PCI to LAD.     Echo 10/05/2020 1. Since the study on 06/19/2020 LVEF has decreased, now severely impaired  at 20-25% and diffuse hypokinesis.  2. Left ventricular ejection fraction, by estimation, is 20 to 25%. The  left ventricle has severely decreased function. The left ventricle  demonstrates global hypokinesis. There is mild concentric left ventricular  hypertrophy. Left ventricular diastolic  parameters are consistent with Grade I diastolic dysfunction (impaired  relaxation).  3. Right ventricular systolic function is normal. The right ventricular  size is normal.  4. The mitral valve is normal in structure. Mild mitral valve  regurgitation. No evidence of mitral stenosis.  5. The aortic valve is normal in structure. Aortic valve regurgitation is  trivial. No aortic stenosis is present.  6. The inferior vena cava is normal in size with greater than 50%  respiratory variability, suggesting right atrial pressure of 3 mmHg.  Patient Profile     75 y.o. male with PMH of LBBB, mildly reduced LVEF, thoracic aortic aneurysm, HTN, multiple symptomatic meningiomas (possible NF2 suspected), h/o PE x 2 and CKD stage III presented with out of hospital VF arrest  related to inferior STEMI. Hospital course complicated by PNA and AKI with Cr up to 8.   Assessment & Plan    1. Inferior STEMI  - cath 10/04/2020 DES to distal LCx  - continue aspirin and Brilinta. Discharge today, waiting on bed to d/c to inpatient rehab.   2. Chronic combined systolic and diastolic CHF  - Echo 00/05/2329 EF 40-45%, grade 1 DD, mild MR, mild AI, ascending aorta 97mm  - Echo 10/05/2020 EF 20-25%, mild LVH, grade 1 DD, mild MR, trivial AI.   - continue hydralazine and long acting nitrate, metoprolol succinate, unable to add ACEI or ARB due to renal dysfunction  - plan to repeat echo in 6 weeks, will defer to MD to consider if need lifevest  3. AKI  4. Newly diagnosed  DM II  5. Hypoxia: may need nocturnal O2  6. H/o PE  7. VF arrest: anoxic brain injury   For questions or updates, please contact [place] Please consult [email or web address] for contact info under        Signed, Almyra Deforest, [place]  10/28/2020, 10:07 AM    Patient seen and examined with Almyra Deforest PA.  Agree as above, with the following exceptions and changes as noted below. Gen: NAD, CV: RRR, no murmurs, Lungs: clear, Abd: soft, Extrem: Warm, well perfused, no edema, Neuro/Psych: alert and oriented x 3, normal mood and affect. All available labs, radiology testing, previous records reviewed.   VF arrest in setting of inferior STEMI, continue DAPT. Will plan for life vest, will plan to get this placed tomorrow if possible.   Elouise Munroe, MD 10/28/20 7:46 PM

## 2020-10-28 NOTE — Plan of Care (Signed)
  Problem: Education: Goal: Knowledge of General Education information will improve Description: Including pain rating scale, medication(s)/side effects and non-pharmacologic comfort measures Outcome: Progressing   Problem: Clinical Measurements: Goal: Ability to maintain clinical measurements within normal limits will improve Outcome: Progressing Goal: Will remain free from infection Outcome: Progressing Goal: Diagnostic test results will improve Outcome: Progressing Goal: Respiratory complications will improve Outcome: Progressing   Problem: Education: Goal: Individualized Educational Video(s) Outcome: Progressing   Problem: Activity: Goal: Ability to return to baseline activity level will improve Outcome: Progressing   Problem: Cardiovascular: Goal: Ability to achieve and maintain adequate cardiovascular perfusion will improve Outcome: Progressing   Problem: Health Behavior/Discharge Planning: Goal: Ability to safely manage health-related needs after discharge will improve Outcome: Progressing

## 2020-10-28 NOTE — Progress Notes (Signed)
Occupational Therapy Treatment Patient Details Name: Timothy Moran MRN: [id number] DOB: 1946-04-01 Today's Date: 10/28/2020    History of present illness Pt is 75 year old male with a history of LBBB, mildly reduced LVEF, 4.2cm TAA, multiple symptomatic meningiomas (possible NF2 suspected), CKD stage III, two prior pulmonary embolisms (now off Eliquis), HTN, and pulmonary nodules, who presented as a resuscitated VF arrest at home and was found to have inferior ST elevation on ECG. He began having chest pain while shoveling snow this afternoon. He collapsed and lost consciousness with no obvious injuries in the fall. No bystander CPR performed. He was found with agonal breathing and an AED-shockable rhythm. He received a total of 4 defibrillations by fire and EMS.  Patient is more alert, but remains confused with decreased awareness and safety.  Pt s/p cardiac cath with stent on 1/23 and was intubated until 1/25.   OT comments  Pt making steady progress towards OT goals this session. Pt received seated on toilet. Pt continues to present with  decreased activity tolerance and generalized weakness but making good progress towards OT goals with pt able to completed toileting hygiene with supervision- set- up assist via lateral leans and able to stand from toilet seat with minguard. Min A for household distance functional mobility with RW. Pt awaiting CIR bed, will continue to follow acutely per POC.     Follow Up Recommendations  CIR;Supervision/Assistance - 24 hour    Equipment Recommendations  Tub/shower seat    Recommendations for Other Services      Precautions / Restrictions Precautions Precautions: Fall Precaution Comments: impulsive; watch RR/O2 Restrictions Weight Bearing Restrictions: No       Mobility Bed Mobility Overal bed mobility: Needs Assistance Bed Mobility: Sit to Supine     Supine to sit: HOB elevated;Supervision Sit to supine: Min guard;HOB elevated   General  bed mobility comments: minguard for safety  Transfers Overall transfer level: Needs assistance Equipment used: Rolling walker (2 wheeled) Transfers: Sit to/from Stand Sit to Stand: Min guard         General transfer comment: minguard from toilet with cues to use hand rails for sit<>stand, initial min guard for steadying assist d/t reports of dizziness    Balance Overall balance assessment: Needs assistance Sitting-balance support: Bilateral upper extremity supported;Feet supported Sitting balance-Leahy Scale: Poor Sitting balance - Comments: Prefers UE support at EOB, supervision for safety.   Standing balance support: No upper extremity supported;During functional activity Standing balance-Leahy Scale: Fair Standing balance comment: minguard for safety to complete ADLS at sink                           ADL either performed or assessed with clinical judgement   ADL Overall ADL's : Needs assistance/impaired     Grooming: Wash/dry hands;Min guard;Standing Grooming Details (indicate cue type and reason): min guard for safety standing at sink     Lower Body Bathing: Supervison/ safety;Sitting/lateral leans;Set up Lower Body Bathing Details (indicate cue type and reason): simulated via psoterior pericare from toilet via lateral leans         Toilet Transfer: Minimal assistance;RW;Ambulation;Regular Toilet;Grab bars Toilet Transfer Details (indicate cue type and reason): MIN A for balance and safety with mobility as pt reports dizziness with mobility Toileting- Clothing Manipulation and Hygiene: Supervision/safety;Set up;Sitting/lateral lean Toileting - Clothing Manipulation Details (indicate cue type and reason): pt able to complete posterior pericare from sitting on toilet with set-up of wipes via lateral  leans     Functional mobility during ADLs: Minimal assistance;Rolling walker General ADL Comments: pt recieved seated on toilet, pt continues to be limited by  decreased activity tolerance and generalized weakness     Vision       Perception     Praxis      Cognition Arousal/Alertness: Awake/alert Behavior During Therapy: WFL for tasks assessed/performed;Flat affect Overall Cognitive Status: Impaired/Different from baseline Area of Impairment: Attention;Following commands;Safety/judgement;Awareness;Problem solving                 Orientation Level: Disoriented to;Time Current Attention Level: Sustained Memory: Decreased short-term memory Following Commands: Follows one step commands consistently;Follows one step commands with increased time;Follows multi-step commands inconsistently Safety/Judgement: Decreased awareness of deficits;Decreased awareness of safety Awareness: Emergent Problem Solving: Requires verbal cues;Difficulty sequencing General Comments: pt slightly more flat this session, likely d/ fatigue. Pt continues to need cues for safety awarness and requires increased time to follow commands        Exercises Exercises: General Lower Extremity General Exercises - Lower Extremity Ankle Circles/Pumps: AROM;Both;10 reps;Supine Long Arc Quad: AROM;Both;10 reps;Seated Hip Flexion/Marching: AROM;10 reps;Both;Seated   Shoulder Instructions       General Comments RR increase to 30 breaths per min with mobility tasks    Pertinent Vitals/ Pain       Pain Assessment: No/denies pain Faces Pain Scale: Hurts a little bit Pain Location: mild pain where in/out cath was placed overnight, some blood clots on transfer pad when pt got up, RN notified Pain Descriptors / Indicators: Discomfort;Sore Pain Intervention(s): Limited activity within patient's tolerance;Monitored during session;Repositioned  Home Living                                          Prior Functioning/Environment              Frequency  Min 2X/week        Progress Toward Goals  OT Goals(current goals can now be found in the  care plan section)  Progress towards OT goals: Progressing toward goals  Acute Rehab OT Goals Patient Stated Goal: to go to CIR OT Goal Formulation: With patient Time For Goal Achievement: 11/05/20 (seen by OTR 2/10) Potential to Achieve Goals: Good  Plan Discharge plan remains appropriate;Frequency remains appropriate    Co-evaluation                 AM-PAC OT "6 Clicks" Daily Activity     Outcome Measure   Help from another person eating meals?: None Help from another person taking care of personal grooming?: A Little Help from another person toileting, which includes using toliet, bedpan, or urinal?: A Little Help from another person bathing (including washing, rinsing, drying)?: A Little Help from another person to put on and taking off regular upper body clothing?: None Help from another person to put on and taking off regular lower body clothing?: A Little 6 Click Score: 20    End of Session Equipment Utilized During Treatment: Rolling walker  OT Visit Diagnosis: Unsteadiness on feet (R26.81);Pain;Other symptoms and signs involving cognitive function;Other abnormalities of gait and mobility (R26.89)   Activity Tolerance Patient tolerated treatment well   Patient Left in bed;with call bell/phone within reach;with family/visitor present   Nurse Communication Mobility status        Time: 1359-1415 OT Time Calculation (min): 16 min  Charges: OT General Charges $OT  Visit: 1 Visit OT Treatments $Self Care/Home Management : 8-22 mins  Harley Alto., COTA/L Acute Rehabilitation Services [phone]    Precious Haws 10/28/2020, 2:51 PM

## 2020-10-28 NOTE — Progress Notes (Signed)
Inpatient Rehabilitation Admissions Coordinator   I do not have a CIR bed available to admit him to today.  Danne Baxter, RN, MSN Rehab Admissions Coordinator [phone] 10/28/2020 12:14 PM

## 2020-10-29 ENCOUNTER — Other Ambulatory Visit: Payer: Self-pay

## 2020-10-29 ENCOUNTER — Inpatient Hospital Stay (HOSPITAL_COMMUNITY)
Admission: RE | Admit: 2020-10-29 | Discharge: 2020-11-06 | DRG: 091 | Disposition: A | Discharge status: Home / Self Care | Payer: Medicare Other | Source: Intra-hospital | Attending: Physical Medicine & Rehabilitation | Admitting: Physical Medicine & Rehabilitation

## 2020-10-29 ENCOUNTER — Other Ambulatory Visit: Payer: Self-pay | Admitting: Physician Assistant

## 2020-10-29 ENCOUNTER — Encounter (HOSPITAL_COMMUNITY): Payer: Self-pay | Admitting: Physical Medicine & Rehabilitation

## 2020-10-29 DIAGNOSIS — Z808 Family history of malignant neoplasm of other organs or systems: Secondary | ICD-10-CM

## 2020-10-29 DIAGNOSIS — D638 Anemia in other chronic diseases classified elsewhere: Secondary | ICD-10-CM | POA: Diagnosis present

## 2020-10-29 DIAGNOSIS — I5042 Chronic combined systolic (congestive) and diastolic (congestive) heart failure: Secondary | ICD-10-CM | POA: Diagnosis present

## 2020-10-29 DIAGNOSIS — D333 Benign neoplasm of cranial nerves: Secondary | ICD-10-CM | POA: Diagnosis present

## 2020-10-29 DIAGNOSIS — I25118 Atherosclerotic heart disease of native coronary artery with other forms of angina pectoris: Secondary | ICD-10-CM | POA: Diagnosis present

## 2020-10-29 DIAGNOSIS — Z955 Presence of coronary angioplasty implant and graft: Secondary | ICD-10-CM | POA: Diagnosis not present

## 2020-10-29 DIAGNOSIS — I2510 Atherosclerotic heart disease of native coronary artery without angina pectoris: Secondary | ICD-10-CM | POA: Diagnosis present

## 2020-10-29 DIAGNOSIS — E1122 Type 2 diabetes mellitus with diabetic chronic kidney disease: Secondary | ICD-10-CM | POA: Diagnosis present

## 2020-10-29 DIAGNOSIS — E872 Acidosis: Secondary | ICD-10-CM | POA: Diagnosis present

## 2020-10-29 DIAGNOSIS — N183 Chronic kidney disease, stage 3 unspecified: Secondary | ICD-10-CM | POA: Diagnosis not present

## 2020-10-29 DIAGNOSIS — Z833 Family history of diabetes mellitus: Secondary | ICD-10-CM

## 2020-10-29 DIAGNOSIS — Z8674 Personal history of sudden cardiac arrest: Secondary | ICD-10-CM

## 2020-10-29 DIAGNOSIS — N1831 Chronic kidney disease, stage 3a: Secondary | ICD-10-CM | POA: Diagnosis present

## 2020-10-29 DIAGNOSIS — D62 Acute posthemorrhagic anemia: Secondary | ICD-10-CM | POA: Diagnosis present

## 2020-10-29 DIAGNOSIS — R319 Hematuria, unspecified: Secondary | ICD-10-CM | POA: Diagnosis present

## 2020-10-29 DIAGNOSIS — R339 Retention of urine, unspecified: Secondary | ICD-10-CM | POA: Diagnosis present

## 2020-10-29 DIAGNOSIS — I519 Heart disease, unspecified: Secondary | ICD-10-CM

## 2020-10-29 DIAGNOSIS — Z86011 Personal history of benign neoplasm of the brain: Secondary | ICD-10-CM | POA: Diagnosis not present

## 2020-10-29 DIAGNOSIS — R2689 Other abnormalities of gait and mobility: Secondary | ICD-10-CM | POA: Diagnosis present

## 2020-10-29 DIAGNOSIS — Z806 Family history of leukemia: Secondary | ICD-10-CM | POA: Diagnosis not present

## 2020-10-29 DIAGNOSIS — Z87891 Personal history of nicotine dependence: Secondary | ICD-10-CM

## 2020-10-29 DIAGNOSIS — G931 Anoxic brain damage, not elsewhere classified: Secondary | ICD-10-CM | POA: Diagnosis present

## 2020-10-29 DIAGNOSIS — R4189 Other symptoms and signs involving cognitive functions and awareness: Secondary | ICD-10-CM | POA: Diagnosis present

## 2020-10-29 DIAGNOSIS — R42 Dizziness and giddiness: Secondary | ICD-10-CM | POA: Diagnosis present

## 2020-10-29 DIAGNOSIS — I255 Ischemic cardiomyopathy: Secondary | ICD-10-CM

## 2020-10-29 DIAGNOSIS — E119 Type 2 diabetes mellitus without complications: Secondary | ICD-10-CM | POA: Diagnosis not present

## 2020-10-29 DIAGNOSIS — I130 Hypertensive heart and chronic kidney disease with heart failure and stage 1 through stage 4 chronic kidney disease, or unspecified chronic kidney disease: Secondary | ICD-10-CM | POA: Diagnosis present

## 2020-10-29 DIAGNOSIS — Z86711 Personal history of pulmonary embolism: Secondary | ICD-10-CM

## 2020-10-29 DIAGNOSIS — I213 ST elevation (STEMI) myocardial infarction of unspecified site: Secondary | ICD-10-CM | POA: Diagnosis present

## 2020-10-29 LAB — URINALYSIS, ROUTINE W REFLEX MICROSCOPIC
Bilirubin Urine: NEGATIVE
Glucose, UA: NEGATIVE mg/dL
Hgb urine dipstick: NEGATIVE
Ketones, ur: NEGATIVE mg/dL
Nitrite: NEGATIVE
Protein, ur: 100 mg/dL — AB
Specific Gravity, Urine: 1.015 (ref 1.005–1.030)
pH: 7 (ref 5.0–8.0)

## 2020-10-29 LAB — GLUCOSE, CAPILLARY
Glucose-Capillary: 99 mg/dL (ref 70–99)
Glucose-Capillary: 159 mg/dL — ABNORMAL HIGH (ref 70–99)
Glucose-Capillary: 90 mg/dL (ref 70–99)
Glucose-Capillary: 113 mg/dL — ABNORMAL HIGH (ref 70–99)

## 2020-10-29 MED ORDER — LINAGLIPTIN 5 MG PO TABS: 5.0000 mg | ORAL_TABLET | Freq: Every day | ORAL | Status: DC

## 2020-10-29 MED ORDER — ADULT MULTIVITAMIN W/MINERALS CH
1.0000 | ORAL_TABLET | Freq: Every day | ORAL | Status: DC
  Administered 2020-10-30 – 2020-11-06 (×8): 1 via ORAL
  Filled 2020-10-29 (×8): qty 1

## 2020-10-29 MED ORDER — OXYCODONE HCL 5 MG PO TABS: 5.0000 mg | ORAL_TABLET | Freq: Four times a day (QID) | ORAL | Status: DC | PRN

## 2020-10-29 MED ORDER — ISOSORBIDE DINITRATE 30 MG PO TABS
30.0000 mg | ORAL_TABLET | Freq: Two times a day (BID) | ORAL | Status: DC
  Administered 2020-10-29 – 2020-11-06 (×16): 30 mg via ORAL
  Filled 2020-10-29 (×16): qty 1

## 2020-10-29 MED ORDER — ISOSORBIDE DINITRATE 30 MG PO TABS: 30.0000 mg | ORAL_TABLET | Freq: Two times a day (BID) | ORAL | 5 refills | Status: DC

## 2020-10-29 MED ORDER — ASPIRIN 81 MG PO CHEW: 81.0000 mg | CHEWABLE_TABLET | Freq: Every day | ORAL | Status: DC

## 2020-10-29 MED ORDER — ALBUTEROL SULFATE (2.5 MG/3ML) 0.083% IN NEBU: 2.5000 mg | INHALATION_SOLUTION | Freq: Four times a day (QID) | RESPIRATORY_TRACT | Status: DC | PRN

## 2020-10-29 MED ORDER — INSULIN ASPART 100 UNIT/ML ~~LOC~~ SOLN: 0.0000 [IU] | Freq: Every day | SUBCUTANEOUS | Status: DC

## 2020-10-29 MED ORDER — ENOXAPARIN SODIUM 40 MG/0.4ML ~~LOC~~ SOLN: 40.0000 mg | SUBCUTANEOUS | Status: DC

## 2020-10-29 MED ORDER — ATORVASTATIN CALCIUM 80 MG PO TABS: 80.0000 mg | ORAL_TABLET | Freq: Every day | ORAL | 3 refills | Status: DC

## 2020-10-29 MED ORDER — TAMSULOSIN HCL 0.4 MG PO CAPS
0.4000 mg | ORAL_CAPSULE | Freq: Every day | ORAL | Status: DC
  Administered 2020-10-30 – 2020-10-31 (×2): 0.4 mg via ORAL
  Filled 2020-10-29 (×2): qty 1

## 2020-10-29 MED ORDER — PROCHLORPERAZINE 25 MG RE SUPP: 12.5000 mg | Freq: Four times a day (QID) | RECTAL | Status: DC | PRN

## 2020-10-29 MED ORDER — TICAGRELOR 90 MG PO TABS: 90.0000 mg | ORAL_TABLET | Freq: Two times a day (BID) | ORAL | 3 refills | Status: DC

## 2020-10-29 MED ORDER — TICAGRELOR 90 MG PO TABS
90.0000 mg | ORAL_TABLET | Freq: Two times a day (BID) | ORAL | Status: DC
  Administered 2020-10-29 – 2020-11-06 (×16): 90 mg via ORAL
  Filled 2020-10-29 (×16): qty 1

## 2020-10-29 MED ORDER — SENNOSIDES-DOCUSATE SODIUM 8.6-50 MG PO TABS
1.0000 | ORAL_TABLET | Freq: Every day | ORAL | Status: DC
  Administered 2020-10-29 – 2020-11-05 (×8): 1 via ORAL
  Filled 2020-10-29 (×8): qty 1

## 2020-10-29 MED ORDER — SIMETHICONE 80 MG PO CHEW: 80.0000 mg | CHEWABLE_TABLET | Freq: Four times a day (QID) | ORAL | Status: DC | PRN

## 2020-10-29 MED ORDER — MELATONIN 3 MG PO TABS: 3.0000 mg | ORAL_TABLET | Freq: Every evening | ORAL | Status: DC | PRN

## 2020-10-29 MED ORDER — INSULIN ASPART 100 UNIT/ML ~~LOC~~ SOLN
0.0000 [IU] | Freq: Three times a day (TID) | SUBCUTANEOUS | Status: DC
  Administered 2020-10-30: 1 [IU] via SUBCUTANEOUS
  Administered 2020-10-31 – 2020-11-01 (×2): 2 [IU] via SUBCUTANEOUS
  Administered 2020-11-02: 1 [IU] via SUBCUTANEOUS
  Administered 2020-11-03 (×2): 2 [IU] via SUBCUTANEOUS
  Administered 2020-11-04 – 2020-11-05 (×2): 1 [IU] via SUBCUTANEOUS

## 2020-10-29 MED ORDER — ALUM & MAG HYDROXIDE-SIMETH 200-200-20 MG/5ML PO SUSP
30.0000 mL | ORAL | Status: DC | PRN
  Administered 2020-11-03: 30 mL via ORAL
  Filled 2020-10-29: qty 30

## 2020-10-29 MED ORDER — LIDOCAINE HCL URETHRAL/MUCOSAL 2 % EX GEL
1.0000 "application " | CUTANEOUS | Status: DC | PRN
  Filled 2020-10-29 (×3): qty 5

## 2020-10-29 MED ORDER — PROCHLORPERAZINE MALEATE 5 MG PO TABS: 5.0000 mg | ORAL_TABLET | Freq: Four times a day (QID) | ORAL | Status: DC | PRN

## 2020-10-29 MED ORDER — POLYETHYLENE GLYCOL 3350 17 G PO PACK: 17.0000 g | PACK | Freq: Every day | ORAL | Status: DC | PRN

## 2020-10-29 MED ORDER — ACETAMINOPHEN 325 MG PO TABS
325.0000 mg | ORAL_TABLET | ORAL | Status: DC | PRN
  Administered 2020-10-31: 650 mg via ORAL
  Filled 2020-10-29: qty 2

## 2020-10-29 MED ORDER — GUAIFENESIN 100 MG/5ML PO SOLN
10.0000 mL | ORAL | Status: DC
  Administered 2020-10-31 – 2020-11-05 (×5): 200 mg via ORAL
  Filled 2020-10-29: qty 10
  Filled 2020-10-29: qty 50
  Filled 2020-10-29 (×5): qty 10
  Filled 2020-10-29: qty 50
  Filled 2020-10-29: qty 5
  Filled 2020-10-29: qty 25
  Filled 2020-10-29: qty 10
  Filled 2020-10-29: qty 25
  Filled 2020-10-29: qty 10

## 2020-10-29 MED ORDER — ENOXAPARIN SODIUM 30 MG/0.3ML ~~LOC~~ SOLN
30.0000 mg | SUBCUTANEOUS | Status: DC
Start: 2020-10-30 — End: 2020-11-06
  Administered 2020-10-30 – 2020-11-06 (×8): 30 mg via SUBCUTANEOUS
  Filled 2020-10-29 (×8): qty 0.3

## 2020-10-29 MED ORDER — METOPROLOL SUCCINATE ER 50 MG PO TB24: 50.0000 mg | ORAL_TABLET | Freq: Every day | ORAL | 1 refills | Status: DC

## 2020-10-29 MED ORDER — ATORVASTATIN CALCIUM 80 MG PO TABS
80.0000 mg | ORAL_TABLET | Freq: Every day | ORAL | Status: DC
  Administered 2020-10-30 – 2020-11-05 (×7): 80 mg via ORAL
  Filled 2020-10-29 (×7): qty 1

## 2020-10-29 MED ORDER — NITROGLYCERIN 0.4 MG SL SUBL: 0.4000 mg | SUBLINGUAL_TABLET | SUBLINGUAL | 3 refills | Status: DC | PRN

## 2020-10-29 MED ORDER — FLEET ENEMA 7-19 GM/118ML RE ENEM: 1.0000 | ENEMA | Freq: Once | RECTAL | Status: DC | PRN

## 2020-10-29 MED ORDER — PROSOURCE PLUS PO LIQD
30.0000 mL | Freq: Two times a day (BID) | ORAL | Status: DC
  Administered 2020-10-30 – 2020-11-05 (×14): 30 mL via ORAL
  Filled 2020-10-29 (×15): qty 30

## 2020-10-29 MED ORDER — ASPIRIN 81 MG PO CHEW
81.0000 mg | CHEWABLE_TABLET | Freq: Every day | ORAL | Status: DC
  Administered 2020-10-30 – 2020-11-06 (×8): 81 mg via ORAL
  Filled 2020-10-29 (×8): qty 1

## 2020-10-29 MED ORDER — HYDRALAZINE HCL 25 MG PO TABS: 25.0000 mg | ORAL_TABLET | Freq: Three times a day (TID) | ORAL | 5 refills | Status: DC

## 2020-10-29 MED ORDER — METOPROLOL SUCCINATE ER 50 MG PO TB24
50.0000 mg | ORAL_TABLET | Freq: Every day | ORAL | Status: DC
  Administered 2020-10-30 – 2020-11-06 (×8): 50 mg via ORAL
  Filled 2020-10-29 (×8): qty 1

## 2020-10-29 MED ORDER — WHITE PETROLATUM EX OINT: 1.0000 "application " | TOPICAL_OINTMENT | CUTANEOUS | Status: DC | PRN

## 2020-10-29 MED ORDER — PROCHLORPERAZINE EDISYLATE 10 MG/2ML IJ SOLN: 5.0000 mg | Freq: Four times a day (QID) | INTRAMUSCULAR | Status: DC | PRN

## 2020-10-29 MED ORDER — HYDRALAZINE HCL 25 MG PO TABS
25.0000 mg | ORAL_TABLET | Freq: Three times a day (TID) | ORAL | Status: DC
  Administered 2020-10-29 – 2020-11-06 (×23): 25 mg via ORAL
  Filled 2020-10-29 (×24): qty 1

## 2020-10-29 MED ORDER — NITROGLYCERIN 0.4 MG SL SUBL: 0.4000 mg | SUBLINGUAL_TABLET | SUBLINGUAL | Status: DC | PRN

## 2020-10-29 MED ORDER — DIPHENHYDRAMINE HCL 12.5 MG/5ML PO ELIX: 12.5000 mg | ORAL_SOLUTION | Freq: Four times a day (QID) | ORAL | Status: DC | PRN

## 2020-10-29 MED ORDER — SODIUM BICARBONATE 650 MG PO TABS
1300.0000 mg | ORAL_TABLET | Freq: Three times a day (TID) | ORAL | Status: DC
  Administered 2020-10-30 – 2020-11-06 (×22): 1300 mg via ORAL
  Filled 2020-10-29 (×24): qty 2

## 2020-10-29 MED ORDER — BISACODYL 10 MG RE SUPP
10.0000 mg | Freq: Every day | RECTAL | Status: DC | PRN
Start: 2020-10-29 — End: 2020-11-06

## 2020-10-29 MED ORDER — GUAIFENESIN-DM 100-10 MG/5ML PO SYRP
5.0000 mL | ORAL_SOLUTION | Freq: Four times a day (QID) | ORAL | Status: DC | PRN
  Administered 2020-11-05: 10 mL via ORAL
  Filled 2020-10-29: qty 10

## 2020-10-29 MED ORDER — ENSURE MAX PROTEIN PO LIQD
11.0000 [oz_av] | Freq: Two times a day (BID) | ORAL | Status: DC
  Administered 2020-10-31 – 2020-11-03 (×2): 11 [oz_av] via ORAL

## 2020-10-29 MED ORDER — DOCUSATE SODIUM 100 MG PO CAPS
200.0000 mg | ORAL_CAPSULE | Freq: Every day | ORAL | Status: DC
  Administered 2020-10-30 – 2020-11-06 (×8): 200 mg via ORAL
  Filled 2020-10-29 (×8): qty 2

## 2020-10-29 MED ORDER — LINAGLIPTIN 5 MG PO TABS: 5.0000 mg | ORAL_TABLET | Freq: Every day | ORAL | 1 refills | Status: DC

## 2020-10-29 NOTE — Progress Notes (Signed)
1445 Pt for CIR. Pt has CRP 2 order by Dr End to meet requirement for stent and MI. Not appropriate at this time for program. Cardiologist can update program if pt becomes more appropriate after completing Rehab. Graylon Good RN BSN 10/29/2020 2:48 PM

## 2020-10-29 NOTE — Progress Notes (Signed)
Inpatient Rehabilitation Admissions Coordinator  CIR bed is available to admit patient today after his LIFE VEST is applied. I have discussed with patient, wife, acute team and TOC. I will make the arrangements to admit today.   Danne Baxter, RN, MSN Rehab Admissions Coordinator [phone] 10/29/2020 12:38 PM

## 2020-10-29 NOTE — H&P (Signed)
Physical Medicine and Rehabilitation Admission H&P    Chief Complaint  Patient presents with  . Functional deficits due to ABI    HPI: Timothy Moran is a 75 year old male with history of  multiple meningiomas, acoustic neuroma,  CKDIII, PE, CHF with CM who was admitted on 10/04/20 after witnessed collapse,  had agonal breathing with  V Fib treated with shock X 4 and epi. Apparently patient developed chest pain while shoving snow, this persisted and wife activated EMS this was followed by collapse while EMS presented. Estimated downtime 3 minutes and EKG showed STEMI. He was started on IV amiodarone  and he underwent cardiac cath revealing 2 vessel CAD with thrombotic occlusion of distal LCx-->treated with PCI with drug eluting stent . He was treated with cooling protocol and cards recommends ASA/tricagrelor X 12 months. Patient was unresponsive with nonpurposeful movements and EEG showed severe diffuse encephalopathy without seizures. CT head shwoed stale left parafalcine and anterior left frontal meningiomas without mass effect.  He tolerated extubation on 01/26 and lethargy resolved but he continued to have confusion with bouts of agitation.   He developed fevers with leucocytosis, metabolic acidosis, CHF and hypoxemia requiring BIPAP.  He was started on IV antibiotics due to concerns of VAP/aspiration PNA, precedex as well as HFNC.  Dr. Patel/nephrology consulted for input felt that  acute on chronic renal failure secondary to cardiorenal syndrome and recommended strict I/ O as well as plans for HD. He was found to have urinary retention therefore foley placed with return of 1.4 L urine and downward trend in BUN/SCr.  Plans for HD cancelled with recommendations for strict I/O.  He was also found to have DM with A1C-7.0 and BS in 200's being managed with SSI--amaryl added briefly but d/c due to BS drop to 70's. . Other issues include episode of hematuria,  issues with insomnia and ongoing  confusion--agitation has resolved.  Patient continues to have cognitive deficits, weakness as well as chronic dizziness affecting mobility and functional status. CIR recommended due to functional decline.   Pt doesn't like Foley- was hopeful it would come out soon.  LBM yesterday- pt thought it was today- wife had to remind him.  Pt reports no significant pain.   Review of Systems  Constitutional: Negative for chills and fever.  HENT: Negative for hearing loss and tinnitus.   Eyes: Negative for blurred vision and double vision.  Respiratory: Negative for cough and shortness of breath.   Cardiovascular: Positive for chest pain (chest wall discomfort). Negative for palpitations and leg swelling.  Gastrointestinal: Negative for constipation, heartburn and nausea.  Genitourinary: Negative for dysuria.       Had "peeing problems" per wife  Musculoskeletal: Negative for back pain, joint pain, myalgias and neck pain.  Skin: Negative for itching and rash.  Neurological: Positive for dizziness, weakness and headaches.  Psychiatric/Behavioral: Positive for memory loss. The patient does not have insomnia.   All other systems reviewed and are negative.    Past Medical History:  Diagnosis Date  . Abnormal echocardiogram 06/07/2019  . Acute on chronic combined systolic and diastolic HF (heart failure) ([place]) 10/26/2020  . Anoxic brain injury ([place]) 10/26/2020  . Arrhythmia   . Ascending aortic aneurysm ([facility]) 4.2 cm based on CT from December 2020 01/29/2018   4.3 cm by echo in 2019  . Benign brain tumor ([place])   . Benign neoplasm of brain ([place]) 12/19/2017  . CAD in native artery residual disease of LAD, may  need PCI, treating medically for now 10/26/2020  . Chest pain 12/04/2017  . Chronic kidney disease    Stage 3  . Chronic kidney disease, stage III (moderate) ([place]) 12/19/2017  . Diabetes mellitus, type 2 ([place] [facility]) 10/06/2020   10/06/20 A1C 7%  . Dizziness 10/11/2019  . Hematuria 10/26/2020  . History of  pulmonary embolus (PE)   . Hypertension   . Iron deficiency anemia rec'd IV iron 10/26/2020  . Ischemic cardiomyopathy 10/26/2020  . Left bundle branch block 12/19/2017  . Nonsustained ventricular tachycardia ([facility]) 10/11/2019  . Pain of left hip joint 05/29/2020  . Personal history of pulmonary embolism 12/19/2017  . Pulmonary hypertension due to thromboembolism ([facility]) 03/09/2018   See echo 12/27/17 with nl PAS vs CTa chest 02/23/18   . S/P angioplasty with stent 10/04/20 DES to LCX  10/26/2020  . Sinus bradycardia 12/22/2017  . Solitary pulmonary nodule on lung CT 03/08/2018   CT 12/04/17 1.0 x 0.8 x 0.7 cm nodular opacity in the RUL vs not seen 03/16/10 posterior segment of the right upper lobe. Marland KitchenSpirometry 03/08/2018    FEV1 3.86 (108%)  Ratio 96 s prior rx  - PET  03/13/18   Low grade c/w adenoca > rec T surgery eval    . Urinary retention 10/26/2020    Past Surgical History:  Procedure Laterality Date  . APPENDECTOMY    . CORONARY/GRAFT ACUTE MI REVASCULARIZATION N/A 10/04/2020   Procedure: Coronary/Graft Acute MI Revascularization;  Surgeon: Nelva Bush, MD;  Location: [facility];  Service: Cardiovascular;  Laterality: N/A;  . HERNIA REPAIR    . LEFT HEART CATH AND CORONARY ANGIOGRAPHY N/A 10/04/2020   Procedure: LEFT HEART CATH AND CORONARY ANGIOGRAPHY;  Surgeon: Nelva Bush, MD;  Location: [facility];  Service: Cardiovascular;  Laterality: N/A;    Family History  Problem Relation Age of Onset  . Diabetes Mother   . Brain cancer Sister   . Melanoma Brother        Radiation Exposure  . Leukemia Brother        Agent Orange    Social History:  Married. Retired Presenter, broadcasting friends in town. He reports that he quit smoking about 52 years ago. His smoking use included cigarettes. He has a 2.50 pack-year smoking history. He has never used smokeless tobacco. He reports current alcohol use--once a week has mixed drink or beer. He reports that he does not use drugs.     Allergies: No Known Allergies    Medications Prior to Admission  Medication Sig Dispense Refill  . B Complex-C (SUPER B COMPLEX PO) Take 1 tablet by mouth daily.    . Cholecalciferol (VITAMIN D3) 5000 units TABS Take 5,000 Units by mouth daily.    . finasteride (PROSCAR) 5 MG tablet Take 5 mg by mouth daily.    . Magnesium 400 MG CAPS Take 400 mg by mouth daily.     . nitroGLYCERIN (NITROSTAT) 0.4 MG SL tablet Place 1 tablet (0.4 mg total) under the tongue every 5 (five) minutes as needed for chest pain. 25 tablet 11  . Omega-3 Fatty Acids (FISH OIL) 1360 MG CAPS Take 2 capsules by mouth daily.    Marland Kitchen oxymetazoline (AFRIN) 0.05 % nasal spray Place 1 spray into both nostrils 2 (two) times daily as needed for congestion.    . rosuvastatin (CRESTOR) 20 MG tablet Take 1 tablet (20 mg total) by mouth daily. (Patient taking differently: Take 20 mg by mouth every evening.) 90 tablet 1  .  tamsulosin (FLOMAX) 0.4 MG CAPS capsule Take 0.4 mg by mouth daily.    . valsartan (DIOVAN) 160 MG tablet Take 1 tablet (160 mg total) by mouth daily. 90 tablet 3    Drug Regimen Review  Drug regimen was reviewed and remains appropriate with no significant issues identified  Home: Home Living Family/patient expects to be discharged to:: Private residence Living Arrangements: Spouse/significant other Available Help at Discharge: Family,Available 24 hours/day Type of Home: House Home Access: Stairs to enter [employer] of Steps: 6 Entrance Stairs-Rails: Right Home Layout: Two level,Able to live on main level with bedroom/bathroom Alternate Level Stairs-Number of Steps: full flight Bathroom Shower/Tub: Multimedia programmer: Standard Bathroom Accessibility: Yes Home Equipment: Cane - single point,Grab bars - tub/shower  Lives With: Spouse   Functional History: Prior Function Level of Independence: Independent  Functional Status:  Mobility: Bed Mobility Overal bed mobility:  Needs Assistance Bed Mobility: Sit to Supine Supine to sit: HOB elevated,Supervision Sit to supine: Min guard,HOB elevated General bed mobility comments: Pt requires supervision because he continues to get dizzy with changes of position. Transfers Overall transfer level: Needs assistance Equipment used: Rolling walker (2 wheeled) Transfers: Sit to/from Stand Sit to Stand: Min assist Stand pivot transfers: Min assist General transfer comment: Pt required min assist today due to impulsivity and dizziness.  Pt quickly becomes dizzy.  Helpful to have a chair nearby. Ambulation/Gait Ambulation/Gait assistance: Min assist Gait Distance (Feet): 140 Feet (30 feet, 40 feet then 70 feet) Assistive device: Rolling walker (2 wheeled) Gait Pattern/deviations: Step-through pattern,Decreased stride length,Drifts right/left,Wide base of support,Leaning posteriorly General Gait Details: Overall steady with RW.  Had to take several standing rest breaks due to fatigue and dizziness per pt. Wife followed with chair so that pt would progress distance. Gait velocity: reduced Gait velocity interpretation: <1.31 ft/sec, indicative of household ambulator    ADL: ADL Overall ADL's : Needs assistance/impaired Eating/Feeding: Set up,Sitting Grooming: Wash/dry hands,Wash/dry face,Oral care,Set up,Sitting Grooming Details (indicate cue type and reason): attempted to stand.  Pt became very dizzy requesting to sit.  Pt sat for appx 5 minutes and then was able to finish in standing. Lower Body Bathing: Supervison/ safety,Sitting/lateral leans,Set up Lower Body Bathing Details (indicate cue type and reason): simulated via psoterior pericare from toilet via lateral leans Upper Body Dressing : Minimal assistance,Sitting Lower Body Dressing: Minimal assistance,Sitting/lateral leans Lower Body Dressing Details (indicate cue type and reason): Pt donned socks without assist but does require assist in standing to pull pants  up and maintain balance. Pt gets very dizzy upon first standing and requires cues to take his time before walking when he first stands. Toilet Transfer: Minimal assistance,RW,Ambulation,Regular Toilet,Grab bars Armed forces technical officer Details (indicate cue type and reason): MIN A for balance and safety with mobility as pt reports dizziness with mobility Toileting- Clothing Manipulation and Hygiene: Supervision/safety,Set up,Sitting/lateral lean Toileting - Clothing Manipulation Details (indicate cue type and reason): pt able to complete posterior pericare from sitting on toilet with set-up of wipes via lateral leans Functional mobility during ADLs: Minimal assistance,Rolling walker General ADL Comments: Pt continues to be very weak, requiring many rest breaks but is motivated to do more each session. Pt requires cues to remember not to get up to quickly and to sit when he becomes dizzy.  Cognition: Cognition Overall Cognitive Status: Impaired/Different from baseline Orientation Level: Oriented to person,Oriented to place Cognition Arousal/Alertness: Awake/alert Behavior During Therapy: WFL for tasks assessed/performed,Flat affect Overall Cognitive Status: Impaired/Different from baseline Area of Impairment:  Attention,Memory,Safety/judgement Orientation Level: Disoriented to,Time Current Attention Level: Selective Memory: Decreased short-term memory Following Commands: Follows one step commands consistently,Follows one step commands with increased time,Follows multi-step commands inconsistently Safety/Judgement: Decreased awareness of deficits,Decreased awareness of safety Awareness: Emergent Problem Solving: Requires verbal cues,Difficulty sequencing General Comments: Pt continues to have some cognitive disfuction in areas of memory, following multistep commands and insight.    Blood pressure 128/86, pulse 67, temperature 98.3 F (36.8 C), temperature source Oral, resp. rate 18, height 6\' 1"   (1.854 m), weight 93.4 kg, SpO2 91 %. Physical Exam Vitals and nursing note reviewed. Exam conducted with a chaperone present.  Constitutional:      Appearance: Normal appearance.     Comments: Pt sitting up in bed- appropriate, poor memory, wife at bedside, NAD  HENT:     Head: Normocephalic and atraumatic.     Comments: Smile equal; tongue midline    Right Ear: External ear normal.     Left Ear: External ear normal.     Nose: Nose normal. No congestion.     Mouth/Throat:     Mouth: Mucous membranes are moist.     Pharynx: Oropharynx is clear. No oropharyngeal exudate.  Eyes:     General:        Right eye: No discharge.        Left eye: No discharge.     Extraocular Movements: Extraocular movements intact.     Conjunctiva/sclera: Conjunctivae normal.  Cardiovascular:     Rate and Rhythm: Normal rate and regular rhythm.     Heart sounds: Normal heart sounds. No murmur heard. No gallop.   Pulmonary:     Comments: CTA B/L- no W/R/R- good air movement Abdominal:     Comments: Soft, NT, ND, (+)BS -hypoactive BS  Genitourinary:    Comments: Foley in place- draining light amber urine Musculoskeletal:     Cervical back: Normal range of motion and neck supple.     Comments: UEs- 5-/5 in Biceps, triceps, grip and finger abd B/L LEs- 5-/5 in HF, KE, DF and PF B/L  Skin:    General: Skin is warm and dry.     Comments: L forearm IV- looks OK No skin breakdown on heels or backside  Neurological:     Mental Status: He is oriented to person, place, and time.     Comments: Speech clear. Able to follow basic commands without difficulty. Able to recall state but had difficulty recalling city (moved from [place] 2 years ago).   Appropriate, however poor memory- couldn't recall when had LBM, if had foley or condom catheter, much info about current hospitalization.  Was Ox2- not to time Rapid alternating movements decreased/slowed B/L  Psychiatric:     Comments: Appropriate, very cordial      Results for orders placed or performed during the hospital encounter of 10/04/20 (from the past 48 hour(s))  Glucose, capillary     Status: Abnormal   Collection Time: 10/27/20 11:29 AM  Result Value Ref Range   Glucose-Capillary 120 (H) 70 - 99 mg/dL    Comment: Glucose reference range applies only to samples taken after fasting for at least 8 hours.  Glucose, capillary     Status: Abnormal   Collection Time: 10/27/20  4:56 PM  Result Value Ref Range   Glucose-Capillary 124 (H) 70 - 99 mg/dL    Comment: Glucose reference range applies only to samples taken after fasting for at least 8 hours.  Glucose, capillary     Status:  Abnormal   Collection Time: 10/27/20  8:37 PM  Result Value Ref Range   Glucose-Capillary 100 (H) 70 - 99 mg/dL    Comment: Glucose reference range applies only to samples taken after fasting for at least 8 hours.  Basic metabolic panel     Status: Abnormal   Collection Time: 10/28/20  1:30 AM  Result Value Ref Range   Sodium 142 135 - 145 mmol/L   Potassium 4.2 3.5 - 5.1 mmol/L   Chloride 109 98 - 111 mmol/L   CO2 20 (L) 22 - 32 mmol/L   Glucose, Bld 82 70 - 99 mg/dL    Comment: Glucose reference range applies only to samples taken after fasting for at least 8 hours.   BUN 34 (H) 8 - 23 mg/dL   Creatinine, Ser 3.01 (H) 0.61 - 1.24 mg/dL   Calcium 8.4 (L) 8.9 - 10.3 mg/dL   GFR, Estimated 21 (L) >60 mL/min    Comment: (NOTE) Calculated using the CKD-EPI Creatinine Equation (2021)    Anion gap 13 5 - 15    Comment: Performed at [facility] [address] [place], [place] [address]  Glucose, capillary     Status: None   Collection Time: 10/28/20  6:10 AM  Result Value Ref Range   Glucose-Capillary 90 70 - 99 mg/dL    Comment: Glucose reference range applies only to samples taken after fasting for at least 8 hours.  Glucose, capillary     Status: Abnormal   Collection Time: 10/28/20 11:14 AM  Result Value Ref Range   Glucose-Capillary 153  (H) 70 - 99 mg/dL    Comment: Glucose reference range applies only to samples taken after fasting for at least 8 hours.  Glucose, capillary     Status: Abnormal   Collection Time: 10/28/20  4:38 PM  Result Value Ref Range   Glucose-Capillary 148 (H) 70 - 99 mg/dL    Comment: Glucose reference range applies only to samples taken after fasting for at least 8 hours.  Glucose, capillary     Status: Abnormal   Collection Time: 10/28/20  9:30 PM  Result Value Ref Range   Glucose-Capillary 108 (H) 70 - 99 mg/dL    Comment: Glucose reference range applies only to samples taken after fasting for at least 8 hours.  Glucose, capillary     Status: None   Collection Time: 10/29/20  6:10 AM  Result Value Ref Range   Glucose-Capillary 99 70 - 99 mg/dL    Comment: Glucose reference range applies only to samples taken after fasting for at least 8 hours.   No results found.     Medical Problem List and Plan: 1.  Anoxic brain injury secondary to VF cardiac arrest with EF of 20-25% requiring Lifevest  -patient may  Shower if can be done- have to deal with lifevest issue- cannot get wet  -ELOS/Goals: 10-14 days- mod I to supervision- might be Supervision to min A for SLP 2.  Antithrombotics: -DVT/anticoagulation:  Pharmaceutical: Lovenox--added 02/17 due to immobility and prior hx of PE (hx of PE ~ 10 years ago per wife).   -antiplatelet therapy: ASA/Brillinta  3. Pain Management: Oxycodone prn for chest wall pain.  4. Mood: LCSW to follow for evaluation and support.   -antipsychotic agents: N/a 5. Neuropsych: This patient is not capable of making decisions on his own behalf. 6. Skin/Wound Care: Routine pressure relief measures. 7. Fluids/Electrolytes/Nutrition: Monitor I/O. Check Lytes in am.   --Add Ensure  max to help with protein stores--->albumin 2.4.  8. CAD s/p PCI/stent: On ASA/Ticagrelor Lipitor, Isordil, hydralazine and metoprolol.  --monitor for symptoms with increase in activity.  9. VT  arrest: Monitor HR tid--on metoprolol daily.  --LifeVest to be placed on 02/17 10. T2DM--new diagnosis: Hgb A1c-7. BS relatively controlled.   --continue to monitor BS ac/hs and use SSI as indicated.   --not a candidate for metformin with CKD.   --May be able to trial amaryl again if intake improves.   11.BPH/ Urinary retention/hematuria: Foley replaced on 02/17--will order UA/UCS for work up  --Remove foley in am and start bladder training. Has seen Dr. Matilde Sprang in the past  --continue Flomax.     12. Acoustic neuroma and other menigioma- loss of hearing on R? side: Chronic dizziness/HA for months per records review.   --followed by Dr. Dema Severin at [facility] (was scheduled for Gamma knife radiosurgery) 13. Acute on chronic renal failure Stage IIIa: Avoid nephrotoxic medications. SCr was 1.38 at admission and has improved from the peak of 8.02-->>3.01  --Monitor voiding with PVR checks once foley d/c- needs foley removal when allowed  --Continue bicarb for metabolic acidosis.  14. Anemia of chronic disease: Monitor H/H for trends ---11.2-->9.0  --low iron/TIBC with elevated Ferritin-->  --received Nulecit X 3 doses and Aranesp  SQ 02/09.      Bary Leriche, PA-C 10/29/2020    I have personally performed a face to face diagnostic evaluation of this patient and formulated the key components of the plan.  Additionally, I have personally reviewed laboratory data, imaging studies, as well as relevant notes and concur with the physician assistant's documentation above.   The patient's status has not changed from the original H&P.  Any changes in documentation from the acute care chart have been noted above.

## 2020-10-29 NOTE — Progress Notes (Signed)
Patient admitted to unit via bed transport. Oriented to room and call bell. Denies pain or discomfort.

## 2020-10-29 NOTE — Progress Notes (Addendum)
Pt ahd been on and off sleeping. When offered Melatonin, pt said he cannot take that anymore bc it harmed him. He was confused and kept saying he wants to be disconnected  from hose that he has (Foley). Wanted to get rid of catheter. Reeducated, reinforced pt. Resting in bed, call bell within reach to  monitor.

## 2020-10-29 NOTE — Care Management Important Message (Signed)
Important Message  Patient Details  Name: Timothy Moran MRN: [id number] Date of Birth: Jul 12, 1946   Medicare Important Message Given:  Yes     [name] 10/29/2020, 10:39 AM

## 2020-10-29 NOTE — Progress Notes (Signed)
Timothy Heys, MD  Physician  Physical Medicine and Rehabilitation  PMR Pre-admission     Signed  Date of Service:  10/21/2020  4:15 PM      Related encounter: ED to Hosp-Admission (Current) from 10/04/2020 in [facility] 4E CV SURGICAL PROGRESSIVE CARE       Signed          Show:Clear all $RemoveBefore'[x]'WpzOJqrBsvImI$ Manual$R'[x]'BS$ Templa'[]'$ Copied  Added by: $RemoveB'[x]'oFlKhtTm$ Cristina Gong, RN$RemoveBeforeDE'[x]'pHReOhfQajBOPCj$ Timothy Heys, MD   '[]'$ Hover for details  PMR Admission Coordinator Pre-Admission Assessment   Patient: Timothy Moran is an 75 y.o., male MRN: [id number] DOB: [birth year] Height: $RemoveBeforeDE'6\' 1"'laMjFaciCwcbrli$  (185.4 cm) Weight: 93.4 kg   Insurance Information HMO:     PPO:      PCP:      IPA:      80/20:      OTHER:  PRIMARY: Medicare a and b      Policy#: [id number]      Subscriber: pt Benefits:  Phone #: passport one source     Name: 2/9 Eff. Date: a 03/12/2010 and b 11/11/2010     Deduct: $1556      Out of Pocket Max: none      Life Max: none CIR: 100%      SNF: 20 full days Outpatient: 80%     Co-Pay: 20% Home Health: 1005      Co-Pay: none DME: 80%     Co-Pay: 20% Providers: pt choice  SECONDARY: [employer]      Policy#: [id number]        Financial Counselor:       Phone#:    The "Data Collection Information Summary" for patients in Inpatient Rehabilitation Facilities with attached "Privacy Act [place] Records" was provided and verbally reviewed with: Family   Emergency Contact Information         Contact Information     Name Relation Home Work [place], [place] [phone]             Current Medical History  Patient Admitting Diagnosis: Debility post cardiac arrest; anoxic brain injury   History of Present Illness: 75 year old male with history of LBBB, mildly reduced LVEF, 4.2 cm Thoracic aortic aneurysm, multiple symptomatic meningiomas, CKD stage III, two prior pulmonary embolisms, HTN and Pulmonary nodules. Presented on 10/04/2020 as a resuscitated VF arrest at home and was found to have a  inferior ST elevation on ECG.   Taken emergently to the cath lab where he was found to have thrombotic occlusion of a large, dominant , distal circumflex. Treated with PCI to distal LCx. He does have residual obstructive disease of the mid-LAD with plans to consider staged PCI to LAD in the future pending renal function and symptomology.. No further episodes of VT/VF noted on telemetry. Still with ongoing chest discomfort which was felt to be musculoskeletal given resuscitative efforts. To continue ASA and Brilinta, Statin, Beta Blocker, isosorbide dinitrate. LIFE VEST to be applied 2/17 prior to CIR admit.   Acute combined CHF/CM possibly ischemic. EF 20 to 25 % with diffuse hypokinesis, mild concentric LVH and mild MR. Began on lasix. Gradual increase in Creatinine with diuresis. Nephrology consulted for monitoring for possible need for hemodialysis for volume management given up trending Creatinine and ongoing pulmonary edema on CXR. Nephrology monitoring and avoiding nephrotoxic agents. Leukocytosis with patient coughing. No clear infection on CXR. Encouraged to use incentive spirometer.    Encephalopathy felt to be due to mild anoxic brain injury. Mental status  continues to improve. He does have a mild tremor on bilateral upper extremities that was not present prior to this admission.    Patient's medical record from [facility] has been reviewed by the rehabilitation admission coordinator and physician.   Past Medical History      Past Medical History:  Diagnosis Date  . Abnormal echocardiogram 06/07/2019  . Acute on chronic combined systolic and diastolic HF (heart failure) ([place]) 10/26/2020  . Anoxic brain injury ([place]) 10/26/2020  . Arrhythmia    . Ascending aortic aneurysm (HCC) 4.2 cm based on CT from December 2020 01/29/2018    4.3 cm by echo in 2019  . Benign brain tumor ([place])    . Benign neoplasm of brain ([place]) 12/19/2017  . CAD in native artery residual disease of LAD, may need  PCI, treating medically for now 10/26/2020  . Chest pain 12/04/2017  . Chronic kidney disease      Stage 3  . Chronic kidney disease, stage III (moderate) ([place]) 12/19/2017  . Diabetes mellitus, type 2 ([place]) 10/06/2020    10/06/20 A1C 7%  . Dizziness 10/11/2019  . Hematuria 10/26/2020  . History of pulmonary embolus (PE)    . Hypertension    . Iron deficiency anemia rec'd IV iron 10/26/2020  . Ischemic cardiomyopathy 10/26/2020  . Left bundle branch block 12/19/2017  . Nonsustained ventricular tachycardia ([place]) 10/11/2019  . Pain of left hip joint 05/29/2020  . Personal history of pulmonary embolism 12/19/2017  . Pulmonary hypertension due to thromboembolism ([place]) 03/09/2018    See echo 12/27/17 with nl PAS vs CTa chest 02/23/18   . S/P angioplasty with stent 10/04/20 DES to LCX  10/26/2020  . Sinus bradycardia 12/22/2017  . Solitary pulmonary nodule on lung CT 03/08/2018    CT 12/04/17 1.0 x 0.8 x 0.7 cm nodular opacity in the RUL vs not seen 03/16/10 posterior segment of the right upper lobe. Marland KitchenSpirometry 03/08/2018    FEV1 3.86 (108%)  Ratio 96 s prior rx  - PET  03/13/18   Low grade c/w adenoca > rec T surgery eval    . Urinary retention 10/26/2020      Family History   family history includes Brain cancer in his sister; Diabetes in his mother; Leukemia in his brother; Melanoma in his brother.   Prior Rehab/Hospitalizations Has the patient had prior rehab or hospitalizations prior to admission? Yes   Has the patient had major surgery during 100 days prior to admission? Yes              Current Medications   Current Facility-Administered Medications:  .  0.9 %  sodium chloride infusion, 250 mL, Intravenous, PRN, Bhagat, Bhavinkumar, PA, Stopped at 10/14/20 1029 .  acetaminophen (TYLENOL) tablet 650 mg, 650 mg, Oral, Q6H PRN, Hammons, Kimberly B, RPH, 650 mg at 10/26/20 2325 .  albuterol (PROVENTIL) (2.5 MG/3ML) 0.083% nebulizer solution 2.5 mg, 2.5 mg, Nebulization, Q6H PRN, Bhagat, Bhavinkumar, PA, 2.5  mg at 10/16/20 1740 .  aspirin chewable tablet 81 mg, 81 mg, Oral, Daily, Hammons, Kimberly B, RPH, 81 mg at 10/29/20 0947 .  atorvastatin (LIPITOR) tablet 80 mg, 80 mg, Oral, Daily, Hammons, Kimberly B, RPH, 80 mg at 10/29/20 0945 .  chlorhexidine (PERIDEX) 0.12 % solution 15 mL, 15 mL, Mouth Rinse, BID, Bhagat, Bhavinkumar, PA, 15 mL at 10/28/20 0801 .  Chlorhexidine Gluconate Cloth 2 % PADS 6 each, 6 each, Topical, Daily, Elouise Munroe, MD, 6 each at 10/28/20 0801 .  diphenhydrAMINE (BENADRYL) capsule 50 mg, 50 mg, Oral, QHS PRN, Donnamae Jude, RPH, 50 mg at 10/22/20 2248 .  docusate sodium (COLACE) capsule 200 mg, 200 mg, Oral, Daily, Cecilie Kicks R, NP, 200 mg at 10/29/20 0944 .  guaiFENesin (ROBITUSSIN) 100 MG/5ML solution 200 mg, 10 mL, Oral, Q4H, Hammons, Kimberly B, RPH, 200 mg at 10/27/20 2026 .  haloperidol lactate (HALDOL) injection 2 mg, 2 mg, Intravenous, Q6H PRN, Meade Maw, MD, 2 mg at 10/16/20 0111 .  hydrALAZINE (APRESOLINE) tablet 25 mg, 25 mg, Oral, Q8H, Dorothy Spark, MD, 25 mg at 10/28/20 2202 .  insulin aspart (novoLOG) injection 0-5 Units, 0-5 Units, Subcutaneous, QHS, Elgergawy, Dawood S, MD .  insulin aspart (novoLOG) injection 0-9 Units, 0-9 Units, Subcutaneous, TID WC, Elgergawy, Silver Huguenin, MD, 2 Units at 10/29/20 1227 .  isosorbide dinitrate (ISORDIL) tablet 30 mg, 30 mg, Oral, BID, Hammons, Kimberly B, RPH, 30 mg at 10/29/20 0946 .  living well with diabetes book MISC, , Does not apply, Once, Elouise Munroe, MD .  LORazepam (ATIVAN) injection 1 mg, 1 mg, Intravenous, Q4H PRN, Bhagat, Bhavinkumar, PA, 1 mg at 10/21/20 2310 .  MEDLINE mouth rinse, 15 mL, Mouth Rinse, q12n4p, Bhagat, Bhavinkumar, PA, 15 mL at 10/24/20 1213 .  melatonin tablet 3 mg, 3 mg, Oral, QHS PRN, Hammons, Kimberly B, RPH, 3 mg at 10/27/20 2329 .  metoprolol succinate (TOPROL-XL) 24 hr tablet 50 mg, 50 mg, Oral, Daily, Minus Breeding, MD, 50 mg at 10/29/20 0946 .  morphine 2  MG/ML injection 2 mg, 2 mg, Intravenous, Q4H PRN, Bhagat, Bhavinkumar, PA, 2 mg at 10/19/20 1848 .  multivitamin with minerals tablet 1 tablet, 1 tablet, Oral, Daily, Dorothy Spark, MD, 1 tablet at 10/29/20 0945 .  oxyCODONE (Oxy IR/ROXICODONE) immediate release tablet 5 mg, 5 mg, Oral, Q6H PRN, Hammons, Kimberly B, RPH, 5 mg at 10/21/20 2311 .  senna-docusate (Senokot-S) tablet 1 tablet, 1 tablet, Oral, QHS, Hammons, Theone Murdoch, RPH, 1 tablet at 10/27/20 2028 .  simethicone (MYLICON) chewable tablet 80 mg, 80 mg, Oral, Q6H PRN, Doran Clay, MD, 80 mg at 10/21/20 2311 .  sodium bicarbonate tablet 1,300 mg, 1,300 mg, Oral, TID, Rosita Fire, MD, 1,300 mg at 10/29/20 0943 .  sodium chloride flush (NS) 0.9 % injection 3 mL, 3 mL, Intravenous, Q12H, Bhagat, Bhavinkumar, PA, 3 mL at 10/29/20 1022 .  sodium chloride flush (NS) 0.9 % injection 3 mL, 3 mL, Intravenous, PRN, Bhagat, Bhavinkumar, PA .  tamsulosin (FLOMAX) capsule 0.4 mg, 0.4 mg, Oral, QPC supper, Cecilie Kicks R, NP, 0.4 mg at 10/28/20 1805 .  ticagrelor (BRILINTA) tablet 90 mg, 90 mg, Oral, BID, Hammons, Kimberly B, RPH, 90 mg at 10/29/20 0954 .  white petrolatum (VASELINE) gel, , Topical, PRN, Bhagat, Bhavinkumar, PA, 0.2 application at 76/16/07 0110   Patients Current Diet:     Diet Order                      Diet Carb Modified Fluid consistency: Thin; Room service appropriate? Yes; Fluid restriction: Other (see comments)  Diet effective now                      Precautions / Restrictions Precautions Precautions: Fall Precaution Comments: impulsive; watch RR/O2 Restrictions Weight Bearing Restrictions: No    Has the patient had 2 or more falls or a fall with injury in the past year? No   Prior  Activity Level Community (5-7x/wk): Independ   Prior Functional Level Self Care: Did the patient need help bathing, dressing, using the toilet or eating? Independent   Indoor Mobility: Did the patient need  assistance with walking from room to room (with or without device)? Independent   Stairs: Did the patient need assistance with internal or external stairs (with or without device)? Independent   Functional Cognition: Did the patient need help planning regular tasks such as shopping or remembering to take medications? Independent   Home Assistive Devices / Equipment Home Assistive Devices/Equipment: None Home Equipment: Cane - single point,Grab bars - tub/shower   Prior Device Use: Indicate devices/aids used by the patient prior to current illness, exacerbation or injury? None of the above   Current Functional Level Cognition   Overall Cognitive Status: Impaired/Different from baseline Current Attention Level: Selective Orientation Level: Oriented to person,Oriented to place Following Commands: Follows one step commands consistently,Follows one step commands with increased time,Follows multi-step commands inconsistently Safety/Judgement: Decreased awareness of deficits,Decreased awareness of safety General Comments: Pt continues to have some cognitive disfuction in areas of memory, following multistep commands and insight.    Extremity Assessment (includes Sensation/Coordination)   Upper Extremity Assessment: Overall WFL for tasks assessed  Lower Extremity Assessment: Defer to PT evaluation     ADLs   Overall ADL's : Needs assistance/impaired Eating/Feeding: Set up,Sitting Grooming: Wash/dry hands,Wash/dry face,Oral care,Set up,Sitting Grooming Details (indicate cue type and reason): attempted to stand.  Pt became very dizzy requesting to sit.  Pt sat for appx 5 minutes and then was able to finish in standing. Lower Body Bathing: Supervison/ safety,Sitting/lateral leans,Set up Lower Body Bathing Details (indicate cue type and reason): simulated via psoterior pericare from toilet via lateral leans Upper Body Dressing : Minimal assistance,Sitting Lower Body Dressing: Minimal  assistance,Sitting/lateral leans Lower Body Dressing Details (indicate cue type and reason): Pt donned socks without assist but does require assist in standing to pull pants up and maintain balance. Pt gets very dizzy upon first standing and requires cues to take his time before walking when he first stands. Toilet Transfer: Minimal assistance,RW,Ambulation,Regular Toilet,Grab bars Armed forces technical officer Details (indicate cue type and reason): MIN A for balance and safety with mobility as pt reports dizziness with mobility Toileting- Clothing Manipulation and Hygiene: Supervision/safety,Set up,Sitting/lateral lean Toileting - Clothing Manipulation Details (indicate cue type and reason): pt able to complete posterior pericare from sitting on toilet with set-up of wipes via lateral leans Functional mobility during ADLs: Minimal assistance,Rolling walker General ADL Comments: Pt continues to be very weak, requiring many rest breaks but is motivated to do more each session. Pt requires cues to remember not to get up to quickly and to sit when he becomes dizzy.     Mobility   Overal bed mobility: Needs Assistance Bed Mobility: Sit to Supine Supine to sit: HOB elevated,Supervision Sit to supine: Min guard,HOB elevated General bed mobility comments: Pt requires supervision because he continues to get dizzy with changes of position.     Transfers   Overall transfer level: Needs assistance Equipment used: Rolling walker (2 wheeled) Transfers: Sit to/from Stand Sit to Stand: Min assist Stand pivot transfers: Min assist General transfer comment: Pt required min assist today due to impulsivity and dizziness.  Pt quickly becomes dizzy.  Helpful to have a chair nearby.     Ambulation / Gait / Stairs / Wheelchair Mobility   Ambulation/Gait Ambulation/Gait assistance: Herbalist (Feet): 140 Feet (30 feet, 40 feet then 70 feet)  Assistive device: Rolling walker (2 wheeled) Gait Pattern/deviations:  Step-through pattern,Decreased stride length,Drifts right/left,Wide base of support,Leaning posteriorly General Gait Details: Overall steady with RW.  Had to take several standing rest breaks due to fatigue and dizziness per pt. Wife followed with chair so that pt would progress distance. Gait velocity: reduced Gait velocity interpretation: <1.31 ft/sec, indicative of household ambulator     Posture / Balance Dynamic Sitting Balance Sitting balance - Comments: Prefers UE support at EOB, supervision for safety. Balance Overall balance assessment: Needs assistance Sitting-balance support: Bilateral upper extremity supported,Feet supported Sitting balance-Leahy Scale: Fair Sitting balance - Comments: Prefers UE support at EOB, supervision for safety. Postural control: Posterior lean Standing balance support: Bilateral upper extremity supported,During functional activity Standing balance-Leahy Scale: Fair Standing balance comment: minguard for safety to complete ADLS at sink     Special needs/care consideration Hgb A1c 7; new dx this admission Designated visitor is wife, Delcie Roch Staged PCI pending as an outpatient 16 FR indwelling catheter placed on 2/14 LIFE VEST to be applied 2/17 prior to CIR admit    Previous Home Environment  Living Arrangements: Spouse/significant other  Lives With: Spouse Available Help at Discharge: Family,Available 24 hours/day Type of Home: [place]: Two level,Able to live on main level with bedroom/bathroom Alternate Level Stairs-Number of Steps: full flight Home Access: Stairs to enter Entrance Stairs-Rails: Right Entrance Stairs-Number of Steps: 6 Bathroom Shower/Tub: Multimedia programmer: Standard Bathroom Accessibility: Yes How Accessible: Accessible via walker [place]: No   Discharge Living Setting Plans for Discharge Living Setting: Patient's home,Lives with (comment) (wife) Type of Home at Discharge: House Discharge  Home Layout: Two level,Able to live on main level with bedroom/bathroom Alternate Level Stairs-Number of Steps: full flight Discharge Home Access: Stairs to enter Entrance Stairs-Rails: Right Entrance Stairs-Number of Steps: 6 Discharge Bathroom Shower/Tub: Walk-in shower Discharge Bathroom Toilet: Standard Discharge Bathroom Accessibility: Yes How Accessible: Accessible via walker Does the patient have any problems obtaining your medications?: No   Social/Family/Support Systems Patient Roles: Spouse Contact Information: wife, Delcie Roch Anticipated Caregiver: wife Anticipated Ambulance person Information: as above Ability/Limitations of Caregiver: no limitations Caregiver Availability: 24/7 Discharge Plan Discussed with Primary Caregiver: Yes Is Caregiver In Agreement with Plan?: Yes Does Caregiver/Family have Issues with Lodging/Transportation while Pt is in Rehab?: No   Goals Patient/Family Goal for Rehab: Mod I to supervision with PT, OT, and SLP Expected length of stay: ELOS 10 to 14 days Pt/Family Agrees to Admission and willing to participate: Yes Program Orientation Provided & Reviewed with Pt/Caregiver Including Roles  & Responsibilities: Yes   Decrease burden of Care through IP rehab admission: n/a   Possible need for SNF placement upon discharge: not anticipated   Patient Condition: I have reviewed [facility], spoken with CM, and patient and spouse. I met with patient at the bedside for inpatient rehabilitation assessment.  Patient will benefit from ongoing PT, OT and SLP, can actively participate in 3 hours of therapy a day 5 days of the week, and can make measurable gains during the admission.  Patient will also benefit from the coordinated team approach during an Inpatient Acute Rehabilitation admission.  The patient will receive intensive therapy as well as Rehabilitation physician, nursing, social worker, and care management interventions.   Due to bladder management, bowel management, safety, skin/wound care, disease management, medication administration, pain management and patient education the patient requires 24 hour a day rehabilitation nursing.  The patient is currently min assist  overall with mobility and basic ADLs.  Discharge setting and therapy post discharge at home with home health is anticipated.  Patient has agreed to participate in the Acute Inpatient Rehabilitation Program and will admit today.   Preadmission Screen Completed By:  Cleatrice Burke, 10/29/2020 12:41 PM ______________________________________________________________________   Discussed status with Dr. Dagoberto Ligas  on  10/29/2020 at  1242 and received approval for admission today.   Admission Coordinator:  Cleatrice Burke, RN, time  7159 Date  10/29/2020    Assessment/Plan: Diagnosis: 1. Does the need for close, 24 hr/day Medical supervision in concert with the patient's rehab needs make it unreasonable for this patient to be served in a less intensive setting? Yes 2. Co-Morbidities requiring supervision/potential complications: HTN, VF arrest s/p Lifevest before coming; CKD 3 with AKI Cr ~ 3.0, pulm modules, menigiomas with dizziness, Pes, EF 20-25%, anoxic brain injury 3. Due to bladder management, bowel management, safety, skin/wound care, disease management, medication administration, pain management and patient education, does the patient require 24 hr/day rehab nursing? Yes 4. Does the patient require coordinated care of a physician, rehab nurse, PT, OT, and SLP to address physical and functional deficits in the context of the above medical diagnosis(es)? Yes Addressing deficits in the following areas: balance, endurance, locomotion, strength, transferring, bathing, dressing, feeding, grooming, toileting, cognition, speech and language 5. Can the patient actively participate in an intensive therapy program of at least 3 hrs of therapy 5 days a  week? Yes 6. The potential for patient to make measurable gains while on inpatient rehab is fair 7. Anticipated functional outcomes upon discharge from inpatient rehab: modified independent and supervision PT, modified independent and supervision OT, modified independent and supervision SLP 8. Estimated rehab length of stay to reach the above functional goals is: 10-14 days 9. Anticipated discharge destination: Home 10. Overall Rehab/Functional Prognosis: fair     MD Signature:            Revision History                                            Routing History              Note Details  Jan Fireman, MD File Time 10/29/2020 12:54 PM  Author Type Physician Status Signed  Last Editor Timothy Heys, MD Service Physical Medicine and Rehabilitation

## 2020-10-29 NOTE — Progress Notes (Addendum)
Inpatient Rehabilitation Medication Review by a Pharmacist  A complete drug regimen review was completed for this patient to identify any potential clinically significant medication issues.  Clinically significant medication issues were identified:  Yes   Type of Medication Issue Identified Description of Issue Urgent (address now) Non-Urgent (address on AM team rounds) Plan Plan Accepted by Provider? (Yes / No / Pending AM Rounds)  Drug Interaction(s) (clinically significant)       Duplicate Therapy       Allergy       No Medication Administration End Date       Incorrect Dose  Enoxaparin dose should be 30 mg/day, due to TBW CrCl of 29 ml/min (this was the dose the pt most recently rec'd at [facility] before it was stopped due to hematuria)     Additional Drug Therapy Needed       Other  Fleet sodium phosphate enema was ordered PRN severe constipation; this product is not recommended in pts with significant renal insufficiency (pt's CrCl ~26 ml/min); suggest using another medication PRN severe constipation (did not verify order; day pharmacist will F/U with CIR provider on 2/18)  With CrCl ~26 ml/min, would reconsider PRN antacid to use product without magnesium if pt requires frequent administration of PRN antacid  Enoxaparin has been reordered, due to immobility; [facility] discharge summary stated that enoxaparin was discontinued due to hematuria with in/out cath  The following medications were listed on pt's discharge summary from [facility], but were not ordered on admission to CIR: Finasteride, vitamin D3, fish oil (these meds were on pt's PTA med list, but he did not receive them at [facility] and they were not re-ordered on admission to CIR; magnesium was also on discharge med list - if used, suggest monitoring magnesium frequently, due to renal dysfunction) Non urgent Pending review by CIR team on AM rounds Pending AM rounds    Name of provider  notified for urgent issues identified:  N/A  For non-urgent medication issues to be resolved on team rounds tomorrow morning a [facility] Secure Chat Handoff was sent to:  N/A (pt transferred to [place] after 2100 PM)  Time spent performing this drug regimen review (minutes): [place], PharmD, BCPS, [facility] Clinical Pharmacist 10/29/2020 10:24 PM   Addendum (2350): Dr. Erling Cruz called back and okay to change Lovenox to 30mg  daily with CrCl 26 ml/min. Discussed the one time prn Fleet enema and she is okay with leaving this in since it is only a one-time dose and will only be given if needed.  Sherlon Handing, PharmD, BCPS Please see amion for complete clinical pharmacist phone list 10/29/2020 11:50 PM

## 2020-10-29 NOTE — TOC Transition Note (Signed)
Transition of Care (TOC) - CM/SW Discharge Note Marvetta Gibbons RN, BSN Transitions of Care Unit 4E- RN Case Manager See Treatment Team for direct phone #    Patient Details  Name: Timothy Moran MRN: [id number] Date of Birth: 02-Nov-1945  Transition of Care [facility]) CM/SW Contact:  Dawayne Patricia, RN Phone Number: 10/29/2020, 3:33 PM   Clinical Narrative:    Notified this am that pt will need Life Vest for discharge- order has been signed- spoke with Dorian Pod with [employer] and order along with clinicals faxed to [place] for approval. Plan will be to have pt fitted with Life Vest later today around 5pm.   Also notified by Pamala Hurry with [facility] that pt will have bed available today for INPT admit- per Pamala Hurry can admit later today to [place] [facility] [facility] after pt is fitted with Life Vest.  MD has agreed to transition to [place] [facility] later today.    Final next level of care: IP Rehab Facility Barriers to Discharge: No Barriers Identified   Patient Goals and CMS Choice Patient states their goals for this hospitalization and ongoing recovery are:: rehab      Discharge Placement                 [facility]      Discharge Plan and Services   Discharge Planning Services: CM Consult Post Acute Care Choice: IP Rehab          DME Arranged: Life vest DME Agency: [employer] Date DME Agency Contacted: 10/29/20 Time DME Agency Contacted: 1100 Representative spoke with at DME Agency: Dorian Pod HH Arranged: NA [place] Agency: NA        Social Determinants of Health ([place]) Interventions     Readmission Risk Interventions Readmission Risk Prevention Plan 10/29/2020  Transportation Screening Complete  Medication Review Press photographer) Complete  PCP or Specialist appointment within 3-5 days of discharge Not Complete  PCP/Specialist Appt Not Complete comments going to [place] [facility]  [place] or [place] Complete  SW Recovery Care/Counseling Consult Complete  [place] Not Applicable  Some recent data might be hidden

## 2020-10-29 NOTE — Progress Notes (Signed)
Occupational Therapy Treatment Patient Details Name: Timothy Moran MRN: [id number] DOB: 06/08/1946 Today's Date: 10/29/2020    History of present illness Pt is 75 year old male with a history of LBBB, mildly reduced LVEF, 4.2cm TAA, multiple symptomatic meningiomas (possible NF2 suspected), CKD stage III, two prior pulmonary embolisms (now off Eliquis), HTN, and pulmonary nodules, who presented as a resuscitated VF arrest at home and was found to have inferior ST elevation on ECG. He began having chest pain while shoveling snow this afternoon. He collapsed and lost consciousness with no obvious injuries in the fall. No bystander CPR performed. He was found with agonal breathing and an AED-shockable rhythm. He received a total of 4 defibrillations by fire and EMS.  Patient is more alert, but remains confused with decreased awareness and safety.  Pt s/p cardiac cath with stent on 1/23 and was intubated until 1/25.   OT comments  Pt making steady progress with adls and functional mobility. Pt becomes very dizzy when transferring supine to sit and from sit to stand requiring reminders to take his time with these transitions and give his body some time to readjust. Wife states pt spends a lot of time flat in the bed.  Recommend that pt be in the chair a minimum of 2-3 hours a day (broken up) to get his body accustomed to being in standing and sitting positions.  Pt left in chair after session.  Will continue to follow with focus on more adls in standing.    Follow Up Recommendations  CIR;Supervision/Assistance - 24 hour    Equipment Recommendations  Tub/shower seat    Recommendations for Other Services      Precautions / Restrictions Precautions Precautions: Fall Precaution Comments: impulsive; watch RR/O2 Restrictions Weight Bearing Restrictions: No       Mobility Bed Mobility Overal bed mobility: Needs Assistance Bed Mobility: Sit to Supine     Supine to sit: HOB  elevated;Supervision     General bed mobility comments: Pt requires supervision because he continues to get dizzy with changes of position.  Transfers Overall transfer level: Needs assistance Equipment used: Rolling walker (2 wheeled) Transfers: Sit to/from Stand Sit to Stand: Min assist Stand pivot transfers: Min assist       General transfer comment: Pt required min assist today due to impulsivity and dizziness.  Pt quickly becomes dizzy.  Helpful to have a chair nearby.    Balance Overall balance assessment: Needs assistance Sitting-balance support: Bilateral upper extremity supported;Feet supported Sitting balance-Leahy Scale: Fair     Standing balance support: Bilateral upper extremity supported;During functional activity Standing balance-Leahy Scale: Fair Standing balance comment: minguard for safety to complete ADLS at sink                           ADL either performed or assessed with clinical judgement   ADL Overall ADL's : Needs assistance/impaired Eating/Feeding: Set up;Sitting   Grooming: Wash/dry hands;Wash/dry face;Oral care;Set up;Sitting Grooming Details (indicate cue type and reason): attempted to stand.  Pt became very dizzy requesting to sit.  Pt sat for appx 5 minutes and then was able to finish in standing.             Lower Body Dressing: Minimal assistance;Sitting/lateral leans Lower Body Dressing Details (indicate cue type and reason): Pt donned socks without assist but does require assist in standing to pull pants up and maintain balance. Pt gets very dizzy upon first standing and requires cues  to take his time before walking when he first stands. Toilet Transfer: Minimal assistance;RW;Ambulation;Regular Toilet;Grab bars Toilet Transfer Details (indicate cue type and reason): MIN A for balance and safety with mobility as pt reports dizziness with mobility Toileting- Clothing Manipulation and Hygiene: Supervision/safety;Set  up;Sitting/lateral lean Toileting - Clothing Manipulation Details (indicate cue type and reason): pt able to complete posterior pericare from sitting on toilet with set-up of wipes via lateral leans     Functional mobility during ADLs: Minimal assistance;Rolling walker General ADL Comments: Pt continues to be very weak, requiring many rest breaks but is motivated to do more each session. Pt requires cues to remember not to get up to quickly and to sit when he becomes dizzy.     Vision   Vision Assessment?: No apparent visual deficits   Perception     Praxis      Cognition Arousal/Alertness: Awake/alert Behavior During Therapy: WFL for tasks assessed/performed;Flat affect Overall Cognitive Status: Impaired/Different from baseline Area of Impairment: Attention;Memory;Safety/judgement                   Current Attention Level: Selective Memory: Decreased short-term memory   Safety/Judgement: Decreased awareness of deficits;Decreased awareness of safety   Problem Solving: Requires verbal cues;Difficulty sequencing General Comments: Pt continues to have some cognitive disfuction in areas of memory, following multistep commands and insight.        Exercises     Shoulder Instructions       General Comments Pt making progress toward doing more adls on his feet. Pt fatigues quickly.    Pertinent Vitals/ Pain       Pain Assessment: No/denies pain  Home Living                                          Prior Functioning/Environment              Frequency  Min 2X/week        Progress Toward Goals  OT Goals(current goals can now be found in the care plan section)  Progress towards OT goals: Progressing toward goals  Acute Rehab OT Goals Patient Stated Goal: to go to CIR OT Goal Formulation: With patient Time For Goal Achievement: 11/05/20 Potential to Achieve Goals: Good ADL Goals Pt Will Perform Grooming: with supervision;standing Pt  Will Perform Lower Body Bathing: with supervision;sit to/from stand Pt Will Perform Lower Body Dressing: with supervision;sit to/from stand Pt Will Transfer to Toilet: with supervision;ambulating Pt Will Perform Toileting - Clothing Manipulation and hygiene: with supervision;sit to/from stand  Plan Discharge plan remains appropriate;Frequency remains appropriate    Co-evaluation                 AM-PAC OT "6 Clicks" Daily Activity     Outcome Measure   Help from another person eating meals?: None Help from another person taking care of personal grooming?: A Little Help from another person toileting, which includes using toliet, bedpan, or urinal?: A Little Help from another person bathing (including washing, rinsing, drying)?: A Little Help from another person to put on and taking off regular upper body clothing?: None Help from another person to put on and taking off regular lower body clothing?: A Little 6 Click Score: 20    End of Session Equipment Utilized During Treatment: Rolling walker  OT Visit Diagnosis: Unsteadiness on feet (R26.81);Pain;Other symptoms and signs involving cognitive function;Other  abnormalities of gait and mobility (R26.89)   Activity Tolerance Patient limited by fatigue   Patient Left in chair;with call bell/phone within reach;with family/visitor present   Nurse Communication Mobility status        Time: 1443-6016 OT Time Calculation (min): 28 min  Charges: OT General Charges $OT Visit: 1 Visit OT Treatments $Self Care/Home Management : 23-37 mins   Glenford Peers 10/29/2020, 10:29 AM

## 2020-10-30 DIAGNOSIS — R339 Retention of urine, unspecified: Secondary | ICD-10-CM

## 2020-10-30 DIAGNOSIS — N183 Chronic kidney disease, stage 3 unspecified: Secondary | ICD-10-CM

## 2020-10-30 DIAGNOSIS — E119 Type 2 diabetes mellitus without complications: Secondary | ICD-10-CM

## 2020-10-30 DIAGNOSIS — G931 Anoxic brain damage, not elsewhere classified: Secondary | ICD-10-CM

## 2020-10-30 DIAGNOSIS — D638 Anemia in other chronic diseases classified elsewhere: Secondary | ICD-10-CM

## 2020-10-30 LAB — CBC WITH DIFFERENTIAL/PLATELET
Abs Immature Granulocytes: 0.08 10*3/uL — ABNORMAL HIGH (ref 0.00–0.07)
Basophils Absolute: 0 10*3/uL (ref 0.0–0.1)
Basophils Relative: 1 %
Eosinophils Absolute: 0.4 10*3/uL (ref 0.0–0.5)
Eosinophils Relative: 5 %
HCT: 27.3 % — ABNORMAL LOW (ref 39.0–52.0)
Hemoglobin: 9 g/dL — ABNORMAL LOW (ref 13.0–17.0)
Immature Granulocytes: 1 %
Lymphocytes Relative: 31 %
Lymphs Abs: 2.6 10*3/uL (ref 0.7–4.0)
MCH: 31 pg (ref 26.0–34.0)
MCHC: 33 g/dL (ref 30.0–36.0)
MCV: 94.1 fL (ref 80.0–100.0)
Monocytes Absolute: 0.7 10*3/uL (ref 0.1–1.0)
Monocytes Relative: 9 %
Neutro Abs: 4.5 10*3/uL (ref 1.7–7.7)
Neutrophils Relative %: 53 %
Platelets: 333 10*3/uL (ref 150–400)
RBC: 2.9 MIL/uL — ABNORMAL LOW (ref 4.22–5.81)
RDW: 14.2 % (ref 11.5–15.5)
WBC: 8.4 10*3/uL (ref 4.0–10.5)
nRBC: 0 % (ref 0.0–0.2)

## 2020-10-30 LAB — COMPREHENSIVE METABOLIC PANEL
ALT: 50 U/L — ABNORMAL HIGH (ref 0–44)
AST: 43 U/L — ABNORMAL HIGH (ref 15–41)
Albumin: 2.5 g/dL — ABNORMAL LOW (ref 3.5–5.0)
Alkaline Phosphatase: 77 U/L (ref 38–126)
Anion gap: 12 (ref 5–15)
BUN: 28 mg/dL — ABNORMAL HIGH (ref 8–23)
CO2: 22 mmol/L (ref 22–32)
Calcium: 8.5 mg/dL — ABNORMAL LOW (ref 8.9–10.3)
Chloride: 105 mmol/L (ref 98–111)
Creatinine, Ser: 2.83 mg/dL — ABNORMAL HIGH (ref 0.61–1.24)
GFR, Estimated: 22 mL/min — ABNORMAL LOW (ref >60)
Glucose, Bld: 117 mg/dL — ABNORMAL HIGH (ref 70–99)
Potassium: 3.9 mmol/L (ref 3.5–5.1)
Sodium: 139 mmol/L (ref 135–145)
Total Bilirubin: 0.4 mg/dL (ref 0.3–1.2)
Total Protein: 6.6 g/dL (ref 6.5–8.1)

## 2020-10-30 LAB — GLUCOSE, CAPILLARY
Glucose-Capillary: 114 mg/dL — ABNORMAL HIGH (ref 70–99)
Glucose-Capillary: 128 mg/dL — ABNORMAL HIGH (ref 70–99)
Glucose-Capillary: 120 mg/dL — ABNORMAL HIGH (ref 70–99)
Glucose-Capillary: 107 mg/dL — ABNORMAL HIGH (ref 70–99)

## 2020-10-30 NOTE — Evaluation (Signed)
Physical Therapy Assessment and Plan  Patient Details  Name: Timothy Moran MRN: [id number] Date of Birth: Jan 01, 1946  PT Diagnosis: Abnormality of gait, Cognitive deficits and Vertigo of central origin Rehab Potential: Good ELOS: 14 days   Today's Date: 10/30/2020 PT Individual Time: 3557-3220 PT Individual Time Calculation (min): 75 min    Hospital Problem: Active Problems:   Anoxic brain injury [facility])   Past Medical History:  Past Medical History:  Diagnosis Date  . Abnormal echocardiogram 06/07/2019  . Acute on chronic combined systolic and diastolic HF (heart failure) ([place]) 10/26/2020  . Anoxic brain injury ([facility]) 10/26/2020  . Arrhythmia   . Ascending aortic aneurysm ([facility]) 4.2 cm based on CT from December 2020 01/29/2018   4.3 cm by echo in 2019  . Benign brain tumor ([place])   . Benign neoplasm of brain ([place]) 12/19/2017  . CAD in native artery residual disease of LAD, may need PCI, treating medically for now 10/26/2020  . Chest pain 12/04/2017  . Chronic kidney disease    Stage 3  . Chronic kidney disease, stage III (moderate) ([place]) 12/19/2017  . Diabetes mellitus, type 2 ([place]) 10/06/2020   10/06/20 A1C 7%  . Dizziness 10/11/2019  . Hematuria 10/26/2020  . History of pulmonary embolus (PE)   . Hypertension   . Iron deficiency anemia rec'd IV iron 10/26/2020  . Ischemic cardiomyopathy 10/26/2020  . Left bundle branch block 12/19/2017  . Nonsustained ventricular tachycardia ([place]) 10/11/2019  . Pain of left hip joint 05/29/2020  . Personal history of pulmonary embolism 12/19/2017  . Pulmonary hypertension due to thromboembolism ([place]) 03/09/2018   See echo 12/27/17 with nl PAS vs CTa chest 02/23/18   . S/P angioplasty with stent 10/04/20 DES to LCX  10/26/2020  . Sinus bradycardia 12/22/2017  . Solitary pulmonary nodule on lung CT 03/08/2018   CT 12/04/17 1.0 x 0.8 x 0.7 cm nodular opacity in the RUL vs not seen 03/16/10 posterior segment of the right upper lobe. Marland KitchenSpirometry 03/08/2018    FEV1 3.86  (108%)  Ratio 96 s prior rx  - PET  03/13/18   Low grade c/w adenoca > rec T surgery eval    . Urinary retention 10/26/2020   Past Surgical History:  Past Surgical History:  Procedure Laterality Date  . APPENDECTOMY    . CORONARY/GRAFT ACUTE MI REVASCULARIZATION N/A 10/04/2020   Procedure: Coronary/Graft Acute MI Revascularization;  Surgeon: Nelva Bush, MD;  Location: [place] [facility];  Service: Cardiovascular;  Laterality: N/A;  . HERNIA REPAIR    . LEFT HEART CATH AND CORONARY ANGIOGRAPHY N/A 10/04/2020   Procedure: LEFT HEART CATH AND CORONARY ANGIOGRAPHY;  Surgeon: Nelva Bush, MD;  Location: [place] [facility];  Service: Cardiovascular;  Laterality: N/A;    Assessment & Plan Clinical Impression:Timothy Moran is a 75 year old male with history of  multiple meningiomas, acoustic neuroma,  CKDIII, PE, CHF with CM who was admitted on 10/04/20 after witnessed collapse,  had agonal breathing with  V Fib treated with shock X 4 and epi. Apparently patient developed chest pain while shoving snow, this persisted and wife activated EMS this was followed by collapse while EMS presented. Estimated downtime 3 minutes and EKG showed STEMI. He was started on IV amiodarone  and he underwent cardiac cath revealing 2 vessel CAD with thrombotic occlusion of distal LCx-->treated with PCI with drug eluting stent . He was treated with cooling protocol and cards recommends ASA/tricagrelor X 12 months. Patient was unresponsive with nonpurposeful movements  and EEG showed severe diffuse encephalopathy without seizures. CT head shwoed stale left parafalcine and anterior left frontal meningiomas without mass effect.  He tolerated extubation on 01/26 and lethargy resolved but he continued to have confusion with bouts of agitation.   He developed fevers with leucocytosis, metabolic acidosis, CHF and hypoxemia requiring BIPAP.  He was started on IV antibiotics due to concerns of VAP/aspiration PNA, precedex as  well as HFNC.  Dr. Patel/nephrology consulted for input felt that  acute on chronic renal failure secondary to cardiorenal syndrome and recommended strict I/ O as well as plans for HD. He was found to have urinary retention therefore foley placed with return of 1.4 L urine and downward trend in BUN/SCr.  Plans for HD cancelled with recommendations for strict I/O.  He was also found to have DM with A1C-7.0 and BS in 200's being managed with SSI--amaryl added briefly but d/c due to BS drop to 70's. . Other issues include episode of hematuria,  issues with insomnia and ongoing confusion--agitation has resolved. .  Patient transferred to CIR on 10/29/2020 .   Patient currently requires min with mobility secondary to muscle weakness, decreased cardiorespiratoy endurance, anxiety, decreased attention, decreased problem solving and decreased memory, central origin and decreased standing balance and decreased balance strategies.  And dizzyness.  Prior to hospitalization, patient was independent  with mobility and lived with Spouse in a House home.  Home access is 6Stairs to enter.  Pt required significant additional time during assessment due to anxiety which he described as Overwhelming sense that he cannot explain.Marland KitchenMarland KitchenResponded well to calming approach, reassurance, education. Denied hx of anxiety.  Also discussed w/MD at end of session.   Patient will benefit from skilled PT intervention to maximize safe functional mobility, minimize fall risk and decrease caregiver burden for planned discharge home with 24 hour supervision.  Anticipate patient will benefit from follow up [place] at discharge.  PT - End of Session Activity Tolerance: Tolerates 30+ min activity with multiple rests Endurance Deficit: Yes Endurance Deficit Description: pt limited by mult factors including dizzyness and anxiety as well as fatigue w/activity PT Assessment Rehab Potential (ACUTE/IP ONLY): Good PT Barriers to Discharge: Inaccessible home  environment;Behavior PT Barriers to Discharge Comments: anxiety PT Patient demonstrates impairments in the following area(s): Balance;Behavior;Endurance;Motor;Safety;Perception PT Transfers Functional Problem(s): Bed Mobility;Bed to Chair;Car;Furniture PT Locomotion Functional Problem(s): Ambulation;Stairs PT Plan PT Intensity: Minimum of 1-2 x/day ,45 to 90 minutes PT Duration Estimated Length of Stay: 14 days PT Treatment/Interventions: Ambulation/gait training;DME/adaptive equipment instruction;Neuromuscular re-education;Psychosocial support;Stair training;UE/LE Strength taining/ROM;Wheelchair propulsion/positioning;Balance/vestibular training;Discharge planning;Therapeutic Activities;UE/LE Coordination activities;Cognitive remediation/compensation;Disease management/prevention;Functional mobility training;Patient/family education;Therapeutic Exercise PT Transfers Anticipated Outcome(s): supervision PT Locomotion Anticipated Outcome(s): supervision PT Recommendation Recommendations for Other Services: Neuropsych consult Follow Up Recommendations: Home health PT Patient destination: Home Equipment Recommended: To be determined Equipment Details: has SPC per wife   PT Evaluation Precautions/Restrictions Precautions Precautions: Fall Precaution Comments: cardiac monitor Restrictions Weight Bearing Restrictions: No General   Vital Signs  Pain Pain Assessment Pain Score: 0-No pain Home Living/Prior Functioning Home Living Available Help at Discharge: Family;Available 24 hours/day Type of Home: House Home Access: Stairs to enter [employer] of Steps: 6 Entrance Stairs-Rails: Right Home Layout: Two level;Able to live on main level with bedroom/bathroom Alternate Level Stairs-Number of Steps: full flight Bathroom Shower/Tub: Walk-in shower;Door (built in Clinical cytogeneticist) Bathroom Toilet: Handicapped height Bathroom Accessibility: Yes Additional Comments: grab bars;  water closet  Lives With: Spouse Prior Function Level of Independence: Independent with basic ADLs;Independent  with gait;Independent with homemaking with ambulation;Independent with transfers  Able to Take Stairs?: Yes Driving: Yes Vocation: Retired Leisure: Hobbies-yes (Comment) Comments: yard work, Science writer things Vision/Perception  Vision - Assessment Additional Comments: pt has double vision at baseline and prisms in lenses Perception Perception: Within Functional Limits Praxis Praxis: Intact  Cognition Overall Cognitive Status: Impaired/Different from baseline Arousal/Alertness: Awake/alert Attention: Focused Focused Attention: Impaired Memory: Impaired Memory Impairment: Decreased recall of new information Immediate Memory Recall: Sock;Blue;Bed Memory Recall Sock: Without Cue Memory Recall Blue: Not able to recall Memory Recall Bed: Not able to recall Awareness: Impaired Safety/Judgment: Impaired Sensation Sensation Light Touch: Appears Intact Proprioception: Appears Intact Coordination Gross Motor Movements are Fluid and Coordinated: No Fine Motor Movements are Fluid and Coordinated: No Motor  Motor Motor: Within Functional Limits   Trunk/Postural Assessment  Cervical Assessment Cervical Assessment:  (forward head, age related changes) Thoracic Assessment Thoracic Assessment:  (rounded shoulders, mild kyphosis) Lumbar Assessment Lumbar Assessment:  (post pelvic tilt in sitting) Postural Control Postural Control: Deficits on evaluation (delayed and ineffective balance strategies in standing)  Balance Balance Balance Assessed: Yes Dynamic Sitting Balance Dynamic Sitting - Balance Support: During functional activity;Feet supported Dynamic Sitting - Level of Assistance: 5: Stand by assistance Dynamic Sitting - Balance Activities: Lateral lean/weight shifting;Forward lean/weight shifting Sitting balance - Comments: while dressing at edge of bed, pt  w/signficant dizzyness but no balance loss Static Standing Balance Static Standing - Balance Support: Right upper extremity supported;Left upper extremity supported Static Standing - Level of Assistance: 4: Min assist Static Standing - Comment/# of Minutes: 1 Extremity Assessment  RUE Assessment General Strength Comments: generalized weakness LUE Assessment General Strength Comments: generalized weakness RLE Assessment General Strength Comments: generalized weakness proximal > distal LLE Assessment General Strength Comments: generalized weakness proximal > distal  Care Tool Care Tool Bed Mobility Roll left and right activity   Roll left and right assist level: Supervision/Verbal cueing    Sit to lying activity   Sit to lying assist level: Contact Guard/Touching assist    Lying to sitting edge of bed activity   Lying to sitting edge of bed assist level: Contact Guard/Touching assist     Care Tool Transfers Sit to stand transfer   Sit to stand assist level: Minimal Assistance - Patient > 75%    Chair/bed transfer   Chair/bed transfer assist level: Minimal Assistance - Patient > 75%     Toilet transfer Toilet transfer activity did not occur: Social worker transfer activity did not occur: Safety/medical concerns (anxiety limiting ability to participate)        Care Tool Locomotion Ambulation   Assist level: Minimal Assistance - Patient > 75% Assistive device: Walker-rolling Max distance: 10  Walk 10 feet activity   Assist level: Minimal Assistance - Patient > 75% Assistive device: Walker-rolling   Walk 50 feet with 2 turns activity Walk 50 feet with 2 turns activity did not occur: Safety/medical concerns (limited by anxiety)      Walk 150 feet activity Walk 150 feet activity did not occur: Safety/medical concerns      Walk 10 feet on uneven surfaces activity Walk 10 feet on uneven surfaces activity did not occur: Safety/medical concerns       Stairs Stair activity did not occur: Safety/medical concerns (could not assess due to anxiety)        Walk up/down 1 step activity Walk up/down 1 step or curb (drop down) activity did not occur: Safety/medical  concerns     Walk up/down 4 steps activity did not occuR: Safety/medical concerns  Walk up/down 4 steps activity      Walk up/down 12 steps activity Walk up/down 12 steps activity did not occur: Safety/medical concerns      Pick up small objects from floor Pick up small object from the floor (from standing position) activity did not occur: Safety/medical concerns      Wheelchair Will patient use wheelchair at discharge?: No          Wheel 50 feet with 2 turns activity      Wheel 150 feet activity        Refer to Care Plan for Long Term Goals  SHORT TERM GOAL WEEK 1 PT Short Term Goal 1 (Week 1): Pt will tolerate assessment of stairs PT Short Term Goal 2 (Week 1): Pt will tolerate assessment of car transfers PT Short Term Goal 3 (Week 1): Pt will ambulate 97f w/RW and cga PT Short Term Goal 4 (Week 1): Pt will perform bed to/from wc w/RW and cga  Recommendations for other services: Neuropsych  Skilled Therapeutic Intervention Pt initially supine receiving am meds.  Wife in room and confirms home set up/assist avail at dc.  Therapist obtained 20x 18 wc and cushion for support in sitting/transport needs.   Pt supine to sit w/cga and use of rails.  Pt becomes very dizzy w/transition and requires increased time in sitting for recovery.  Static sitting S only.   Pt able to perform upper body dressing w/set up and supervision, pants and underwear w/min assist.  Total assist for shoes and socks due to dizzyness. Sit to stand w/min assist and pulls up pants w/min assist for balance.  Returns to sitting due to dizzyness. stand pivot transfer to wc w/min assist of 1, slow transition but completes w/safe technique, cues.  Pt transported to gym for continued session.  Sit to  stand from wc to walker w/min assist, pt began shaking, panting, and returns to sitting w/min assist.  Vitals assessed and HR 80, 02 sats 98%.  Pt appears very anxious and voices feelings of "panic" and confirms feeling "overwhelmed".  Emotional support, breathing strategies, calming discussion regarding anxiety/adjustment to new setting ensued and pt receptive.  Extended time required to calm pt.  Pt then able to perform Sit to stand and gait x 153fw/RW, wc follow before again becoming anxious and returning to sitting.  Vitals reassessed w/no significant change.    Pt returned to room.  MD in to round, discussed anxiety limiting session this am w/MD.  MD suggested monitoring to allow pt to adjust to rehab.   stand pivot transfer wc to recliner w/min assist.  Pt left oob in recliner w/chair alarm set and needs in reach.   Mobility Bed Mobility Rolling Right: Supervision/verbal cueing Rolling Left: Supervision/Verbal cueing Right Sidelying to Sit: Supervision/Verbal cueing Sit to Supine: Supervision/Verbal cueing Transfers Transfers: Sit to Stand;Stand Pivot Transfers;Stand to Sit Sit to Stand: Minimal Assistance - Patient > 75% Stand to Sit: Minimal Assistance - Patient > 75% Stand Pivot Transfers: Minimal Assistance - Patient > 75% Stand Pivot Transfer Details: Manual facilitation for weight bearing;Manual facilitation for weight shifting;Verbal cues for precautions/safety Transfer (Assistive device): 1 person hand held assist Locomotion  Gait Ambulation: Yes Gait Assistance: Minimal Assistance - Patient > 75% Gait Distance (Feet): 10 Feet Assistive device: Rolling walker Gait Assistance Details: Tactile cues for initiation;Verbal cues for technique;Tactile cues for posture Gait Assistance Details: min  assist and verbal encouragment to continue forward progression due to anxiety w/task.  Firs attempt pt unable due to anxiety w/short shallow quick breaths, cues for paced  breathing. Gait Gait: Yes Gait Pattern: Decreased step length - left;Decreased step length - right;Trunk flexed Gait velocity: reduced Stairs / Additional Locomotion Stairs: No (was unable to attempt due to significant anxiety during session) Wheelchair Mobility Wheelchair Mobility: No   Discharge Criteria: Patient will be discharged from PT if patient refuses treatment 3 consecutive times without medical reason, if treatment goals not met, if there is a change in medical status, if patient makes no progress towards goals or if patient is discharged from hospital.  The above assessment, treatment plan, treatment alternatives and goals were discussed and mutually agreed upon: by patient and by family  Jerrilyn Cairo 10/30/2020, 1:33 PM

## 2020-10-30 NOTE — H&P (Signed)
Physical Medicine and Rehabilitation Admission H&P        Chief Complaint  Patient presents with  . Functional deficits due to ABI      HPI: Timothy Moran is a 75 year old male with history of  multiple meningiomas, acoustic neuroma,  CKDIII, PE, CHF with CM who was admitted on 10/04/20 after witnessed collapse,  had agonal breathing with  V Fib treated with shock X 4 and epi. Apparently patient developed chest pain while shoving snow, this persisted and wife activated EMS this was followed by collapse while EMS presented. Estimated downtime 3 minutes and EKG showed STEMI. He was started on IV amiodarone  and he underwent cardiac cath revealing 2 vessel CAD with thrombotic occlusion of distal LCx-->treated with PCI with drug eluting stent . He was treated with cooling protocol and cards recommends ASA/tricagrelor X 12 months. Patient was unresponsive with nonpurposeful movements and EEG showed severe diffuse encephalopathy without seizures. CT head shwoed stale left parafalcine and anterior left frontal meningiomas without mass effect.  He tolerated extubation on 01/26 and lethargy resolved but he continued to have confusion with bouts of agitation.    He developed fevers with leucocytosis, metabolic acidosis, CHF and hypoxemia requiring BIPAP.  He was started on IV antibiotics due to concerns of VAP/aspiration PNA, precedex as well as HFNC.  Dr. Patel/nephrology consulted for input felt that  acute on chronic renal failure secondary to cardiorenal syndrome and recommended strict I/ O as well as plans for HD. He was found to have urinary retention therefore foley placed with return of 1.4 L urine and downward trend in BUN/SCr.  Plans for HD cancelled with recommendations for strict I/O.  He was also found to have DM with A1C-7.0 and BS in 200's being managed with SSI--amaryl added briefly but d/c due to BS drop to 70's. . Other issues include episode of hematuria,  issues with insomnia and  ongoing confusion--agitation has resolved.  Patient continues to have cognitive deficits, weakness as well as chronic dizziness affecting mobility and functional status. CIR recommended due to functional decline.    Pt doesn't like Foley- was hopeful it would come out soon.  LBM yesterday- pt thought it was today- wife had to remind him.  Pt reports no significant pain.    Review of Systems  Constitutional: Negative for chills and fever.  HENT: Negative for hearing loss and tinnitus.   Eyes: Negative for blurred vision and double vision.  Respiratory: Negative for cough and shortness of breath.   Cardiovascular: Positive for chest pain (chest wall discomfort). Negative for palpitations and leg swelling.  Gastrointestinal: Negative for constipation, heartburn and nausea.  Genitourinary: Negative for dysuria.       Had "peeing problems" per wife  Musculoskeletal: Negative for back pain, joint pain, myalgias and neck pain.  Skin: Negative for itching and rash.  Neurological: Positive for dizziness, weakness and headaches.  Psychiatric/Behavioral: Positive for memory loss. The patient does not have insomnia.   All other systems reviewed and are negative.         Past Medical History:  Diagnosis Date  . Abnormal echocardiogram 06/07/2019  . Acute on chronic combined systolic and diastolic HF (heart failure) ([place]) 10/26/2020  . Anoxic brain injury ([place]) 10/26/2020  . Arrhythmia    . Ascending aortic aneurysm ([facility]) 4.2 cm based on CT from December 2020 01/29/2018    4.3 cm by echo in 2019  . Benign brain tumor ([place])    .  Benign neoplasm of brain ([place]) 12/19/2017  . CAD in native artery residual disease of LAD, may need PCI, treating medically for now 10/26/2020  . Chest pain 12/04/2017  . Chronic kidney disease      Stage 3  . Chronic kidney disease, stage III (moderate) ([place]) 12/19/2017  . Diabetes mellitus, type 2 ([place]) 10/06/2020    10/06/20 A1C 7%  . Dizziness 10/11/2019  . Hematuria  10/26/2020  . History of pulmonary embolus (PE)    . Hypertension    . Iron deficiency anemia rec'd IV iron 10/26/2020  . Ischemic cardiomyopathy 10/26/2020  . Left bundle branch block 12/19/2017  . Nonsustained ventricular tachycardia ([place]) 10/11/2019  . Pain of left hip joint 05/29/2020  . Personal history of pulmonary embolism 12/19/2017  . Pulmonary hypertension due to thromboembolism ([place]) 03/09/2018    See echo 12/27/17 with nl PAS vs CTa chest 02/23/18   . S/P angioplasty with stent 10/04/20 DES to LCX  10/26/2020  . Sinus bradycardia 12/22/2017  . Solitary pulmonary nodule on lung CT 03/08/2018    CT 12/04/17 1.0 x 0.8 x 0.7 cm nodular opacity in the RUL vs not seen 03/16/10 posterior segment of the right upper lobe. Marland KitchenSpirometry 03/08/2018    FEV1 3.86 (108%)  Ratio 96 s prior rx  - PET  03/13/18   Low grade c/w adenoca > rec T surgery eval    . Urinary retention 10/26/2020           Past Surgical History:  Procedure Laterality Date  . APPENDECTOMY      . CORONARY/GRAFT ACUTE MI REVASCULARIZATION N/A 10/04/2020    Procedure: Coronary/Graft Acute MI Revascularization;  Surgeon: Nelva Bush, MD;  Location: [facility];  Service: Cardiovascular;  Laterality: N/A;  . HERNIA REPAIR      . LEFT HEART CATH AND CORONARY ANGIOGRAPHY N/A 10/04/2020    Procedure: LEFT HEART CATH AND CORONARY ANGIOGRAPHY;  Surgeon: Nelva Bush, MD;  Location: [facility];  Service: Cardiovascular;  Laterality: N/A;           Family History  Problem Relation Age of Onset  . Diabetes Mother    . Brain cancer Sister    . Melanoma Brother          Radiation Exposure  . Leukemia Brother          Agent Orange      Social History:  Married. Retired Presenter, broadcasting friends in town. He reports that he quit smoking about 52 years ago. His smoking use included cigarettes. He has a 2.50 pack-year smoking history. He has never used smokeless tobacco. He reports current alcohol use--once a week has mixed drink or  beer. He reports that he does not use drugs.      Allergies: No Known Allergies            Medications Prior to Admission  Medication Sig Dispense Refill  . B Complex-C (SUPER B COMPLEX PO) Take 1 tablet by mouth daily.      . Cholecalciferol (VITAMIN D3) 5000 units TABS Take 5,000 Units by mouth daily.      . finasteride (PROSCAR) 5 MG tablet Take 5 mg by mouth daily.      . Magnesium 400 MG CAPS Take 400 mg by mouth daily.       . nitroGLYCERIN (NITROSTAT) 0.4 MG SL tablet Place 1 tablet (0.4 mg total) under the tongue every 5 (five) minutes as needed for chest pain. 25 tablet 11  .  Omega-3 Fatty Acids (FISH OIL) 1360 MG CAPS Take 2 capsules by mouth daily.      Marland Kitchen oxymetazoline (AFRIN) 0.05 % nasal spray Place 1 spray into both nostrils 2 (two) times daily as needed for congestion.      . rosuvastatin (CRESTOR) 20 MG tablet Take 1 tablet (20 mg total) by mouth daily. (Patient taking differently: Take 20 mg by mouth every evening.) 90 tablet 1  . tamsulosin (FLOMAX) 0.4 MG CAPS capsule Take 0.4 mg by mouth daily.      . valsartan (DIOVAN) 160 MG tablet Take 1 tablet (160 mg total) by mouth daily. 90 tablet 3      Drug Regimen Review  Drug regimen was reviewed and remains appropriate with no significant issues identified   Home: Home Living Family/patient expects to be discharged to:: Private residence Living Arrangements: Spouse/significant other Available Help at Discharge: Family,Available 24 hours/day Type of Home: House Home Access: Stairs to enter [employer] of Steps: 6 Entrance Stairs-Rails: Right Home Layout: Two level,Able to live on main level with bedroom/bathroom Alternate Level Stairs-Number of Steps: full flight Bathroom Shower/Tub: Multimedia programmer: Standard Bathroom Accessibility: Yes Home Equipment: Cane - single point,Grab bars - tub/shower  Lives With: Spouse   Functional History: Prior Function Level of Independence:  Independent   Functional Status:  Mobility: Bed Mobility Overal bed mobility: Needs Assistance Bed Mobility: Sit to Supine Supine to sit: HOB elevated,Supervision Sit to supine: Min guard,HOB elevated General bed mobility comments: Pt requires supervision because he continues to get dizzy with changes of position. Transfers Overall transfer level: Needs assistance Equipment used: Rolling walker (2 wheeled) Transfers: Sit to/from Stand Sit to Stand: Min assist Stand pivot transfers: Min assist General transfer comment: Pt required min assist today due to impulsivity and dizziness.  Pt quickly becomes dizzy.  Helpful to have a chair nearby. Ambulation/Gait Ambulation/Gait assistance: Min assist Gait Distance (Feet): 140 Feet (30 feet, 40 feet then 70 feet) Assistive device: Rolling walker (2 wheeled) Gait Pattern/deviations: Step-through pattern,Decreased stride length,Drifts right/left,Wide base of support,Leaning posteriorly General Gait Details: Overall steady with RW.  Had to take several standing rest breaks due to fatigue and dizziness per pt. Wife followed with chair so that pt would progress distance. Gait velocity: reduced Gait velocity interpretation: <1.31 ft/sec, indicative of household ambulator   ADL: ADL Overall ADL's : Needs assistance/impaired Eating/Feeding: Set up,Sitting Grooming: Wash/dry hands,Wash/dry face,Oral care,Set up,Sitting Grooming Details (indicate cue type and reason): attempted to stand.  Pt became very dizzy requesting to sit.  Pt sat for appx 5 minutes and then was able to finish in standing. Lower Body Bathing: Supervison/ safety,Sitting/lateral leans,Set up Lower Body Bathing Details (indicate cue type and reason): simulated via psoterior pericare from toilet via lateral leans Upper Body Dressing : Minimal assistance,Sitting Lower Body Dressing: Minimal assistance,Sitting/lateral leans Lower Body Dressing Details (indicate cue type and reason):  Pt donned socks without assist but does require assist in standing to pull pants up and maintain balance. Pt gets very dizzy upon first standing and requires cues to take his time before walking when he first stands. Toilet Transfer: Minimal assistance,RW,Ambulation,Regular Toilet,Grab bars Armed forces technical officer Details (indicate cue type and reason): MIN A for balance and safety with mobility as pt reports dizziness with mobility Toileting- Clothing Manipulation and Hygiene: Supervision/safety,Set up,Sitting/lateral lean Toileting - Clothing Manipulation Details (indicate cue type and reason): pt able to complete posterior pericare from sitting on toilet with set-up of wipes via lateral leans Functional mobility  during ADLs: Minimal assistance,Rolling walker General ADL Comments: Pt continues to be very weak, requiring many rest breaks but is motivated to do more each session. Pt requires cues to remember not to get up to quickly and to sit when he becomes dizzy.   Cognition: Cognition Overall Cognitive Status: Impaired/Different from baseline Orientation Level: Oriented to person,Oriented to place Cognition Arousal/Alertness: Awake/alert Behavior During Therapy: WFL for tasks assessed/performed,Flat affect Overall Cognitive Status: Impaired/Different from baseline Area of Impairment: Attention,Memory,Safety/judgement Orientation Level: Disoriented to,Time Current Attention Level: Selective Memory: Decreased short-term memory Following Commands: Follows one step commands consistently,Follows one step commands with increased time,Follows multi-step commands inconsistently Safety/Judgement: Decreased awareness of deficits,Decreased awareness of safety Awareness: Emergent Problem Solving: Requires verbal cues,Difficulty sequencing General Comments: Pt continues to have some cognitive disfuction in areas of memory, following multistep commands and insight.       Blood pressure 128/86, pulse 67,  temperature 98.3 F (36.8 C), temperature source Oral, resp. rate 18, height 6\' 1"  (1.854 m), weight 93.4 kg, SpO2 91 %. Physical Exam Vitals and nursing note reviewed. Exam conducted with a chaperone present.  Constitutional:      Appearance: Normal appearance.     Comments: Pt sitting up in bed- appropriate, poor memory, wife at bedside, NAD  HENT:     Head: Normocephalic and atraumatic.     Comments: Smile equal; tongue midline    Right Ear: External ear normal.     Left Ear: External ear normal.     Nose: Nose normal. No congestion.     Mouth/Throat:     Mouth: Mucous membranes are moist.     Pharynx: Oropharynx is clear. No oropharyngeal exudate.  Eyes:     General:        Right eye: No discharge.        Left eye: No discharge.     Extraocular Movements: Extraocular movements intact.     Conjunctiva/sclera: Conjunctivae normal.  Cardiovascular:     Rate and Rhythm: Normal rate and regular rhythm.     Heart sounds: Normal heart sounds. No murmur heard. No gallop.   Pulmonary:     Comments: CTA B/L- no W/R/R- good air movement Abdominal:     Comments: Soft, NT, ND, (+)BS -hypoactive BS  Genitourinary:    Comments: Foley in place- draining light amber urine Musculoskeletal:     Cervical back: Normal range of motion and neck supple.     Comments: UEs- 5-/5 in Biceps, triceps, grip and finger abd B/L LEs- 5-/5 in HF, KE, DF and PF B/L  Skin:    General: Skin is warm and dry.     Comments: L forearm IV- looks OK No skin breakdown on heels or backside  Neurological:     Mental Status: He is oriented to person, place, and time.     Comments: Speech clear. Able to follow basic commands without difficulty. Able to recall state but had difficulty recalling city (moved from [place] 2 years ago).   Appropriate, however poor memory- couldn't recall when had LBM, if had foley or condom catheter, much info about current hospitalization.  Was Ox2- not to time Rapid alternating movements  decreased/slowed B/L  Psychiatric:     Comments: Appropriate, very cordial        Lab Results Last 48 Hours        Results for orders placed or performed during the hospital encounter of 10/04/20 (from the past 48 hour(s))  Glucose, capillary     Status:  Abnormal    Collection Time: 10/27/20 11:29 AM  Result Value Ref Range    Glucose-Capillary 120 (H) 70 - 99 mg/dL      Comment: Glucose reference range applies only to samples taken after fasting for at least 8 hours.  Glucose, capillary     Status: Abnormal    Collection Time: 10/27/20  4:56 PM  Result Value Ref Range    Glucose-Capillary 124 (H) 70 - 99 mg/dL      Comment: Glucose reference range applies only to samples taken after fasting for at least 8 hours.  Glucose, capillary     Status: Abnormal    Collection Time: 10/27/20  8:37 PM  Result Value Ref Range    Glucose-Capillary 100 (H) 70 - 99 mg/dL      Comment: Glucose reference range applies only to samples taken after fasting for at least 8 hours.  Basic metabolic panel     Status: Abnormal    Collection Time: 10/28/20  1:30 AM  Result Value Ref Range    Sodium 142 135 - 145 mmol/L    Potassium 4.2 3.5 - 5.1 mmol/L    Chloride 109 98 - 111 mmol/L    CO2 20 (L) 22 - 32 mmol/L    Glucose, Bld 82 70 - 99 mg/dL      Comment: Glucose reference range applies only to samples taken after fasting for at least 8 hours.    BUN 34 (H) 8 - 23 mg/dL    Creatinine, Ser 3.01 (H) 0.61 - 1.24 mg/dL    Calcium 8.4 (L) 8.9 - 10.3 mg/dL    GFR, Estimated 21 (L) >60 mL/min      Comment: (NOTE) Calculated using the CKD-EPI Creatinine Equation (2021)      Anion gap 13 5 - 15      Comment: Performed at [facility] [address], [place], [place] [address]  Glucose, capillary     Status: None    Collection Time: 10/28/20  6:10 AM  Result Value Ref Range    Glucose-Capillary 90 70 - 99 mg/dL      Comment: Glucose reference range applies only to samples taken after fasting for  at least 8 hours.  Glucose, capillary     Status: Abnormal    Collection Time: 10/28/20 11:14 AM  Result Value Ref Range    Glucose-Capillary 153 (H) 70 - 99 mg/dL      Comment: Glucose reference range applies only to samples taken after fasting for at least 8 hours.  Glucose, capillary     Status: Abnormal    Collection Time: 10/28/20  4:38 PM  Result Value Ref Range    Glucose-Capillary 148 (H) 70 - 99 mg/dL      Comment: Glucose reference range applies only to samples taken after fasting for at least 8 hours.  Glucose, capillary     Status: Abnormal    Collection Time: 10/28/20  9:30 PM  Result Value Ref Range    Glucose-Capillary 108 (H) 70 - 99 mg/dL      Comment: Glucose reference range applies only to samples taken after fasting for at least 8 hours.  Glucose, capillary     Status: None    Collection Time: 10/29/20  6:10 AM  Result Value Ref Range    Glucose-Capillary 99 70 - 99 mg/dL      Comment: Glucose reference range applies only to samples taken after fasting for at least 8 hours.  Imaging Results (Last 48 hours)  No results found.           Medical Problem List and Plan: 1.  Anoxic brain injury secondary to VF cardiac arrest with EF of 20-25% requiring Lifevest             -patient may  Shower if can be done- have to deal with lifevest issue- cannot get wet             -ELOS/Goals: 10-14 days- mod I to supervision- might be Supervision to min A for SLP 2.  Antithrombotics: -DVT/anticoagulation:  Pharmaceutical: Lovenox--added 02/17 due to immobility and prior hx of PE (hx of PE ~ 10 years ago per wife).              -antiplatelet therapy: ASA/Brillinta  3. Pain Management: Oxycodone prn for chest wall pain.  4. Mood: LCSW to follow for evaluation and support.              -antipsychotic agents: N/a 5. Neuropsych: This patient is not capable of making decisions on his own behalf. 6. Skin/Wound Care: Routine pressure relief measures. 7.  Fluids/Electrolytes/Nutrition: Monitor I/O. Check Lytes in am.              --Add Ensure max to help with protein stores--->albumin 2.4.  8. CAD s/p PCI/stent: On ASA/Ticagrelor Lipitor, Isordil, hydralazine and metoprolol.             --monitor for symptoms with increase in activity.  9. VT arrest: Monitor HR tid--on metoprolol daily.             --LifeVest to be placed on 02/17 10. T2DM--new diagnosis: Hgb A1c-7. BS relatively controlled.              --continue to monitor BS ac/hs and use SSI as indicated.              --not a candidate for metformin with CKD.              --May be able to trial amaryl again if intake improves.   11.BPH/ Urinary retention/hematuria: Foley replaced on 02/17--will order UA/UCS for work up             --Remove foley in am and start bladder training. Has seen Dr. Matilde Sprang in the past             --continue Flomax.     12. Acoustic neuroma and other menigioma- loss of hearing on R? side: Chronic dizziness/HA for months per records review.              --followed by Dr. Dema Severin at [facility] (was scheduled for Gamma knife radiosurgery) 13. Acute on chronic renal failure Stage IIIa: Avoid nephrotoxic medications. SCr was 1.38 at admission and has improved from the peak of 8.02-->>3.01             --Monitor voiding with PVR checks once foley d/c- needs foley removal when allowed             --Continue bicarb for metabolic acidosis.  14. Anemia of chronic disease: Monitor H/H for trends ---11.2-->9.0             --low iron/TIBC with elevated Ferritin-->             --received Nulecit X 3 doses and Aranesp  SQ 02/09.         Bary Leriche, PA-C 10/29/2020      I have personally performed a face  to face diagnostic evaluation of this patient and formulated the key components of the plan.  Additionally, I have personally reviewed laboratory data, imaging studies, as well as relevant notes and concur with the physician assistant's documentation above.   The patient's  status has not changed from the original H&P.  Any changes in documentation from the acute care chart have been noted above.

## 2020-10-30 NOTE — Evaluation (Signed)
Speech Language Pathology Assessment and Plan  Patient Details  Name: Timothy Moran MRN: [id number] Date of Birth: 1946-01-04  SLP Diagnosis: Cognitive Impairments  Rehab Potential: Good ELOS: 14 days    Today's Date: 10/30/2020 SLP Individual Time: 1105-1200 SLP Individual Time Calculation (min): 66 min   Hospital Problem: Principal Problem:   Anoxic brain injury [facility])  Past Medical History:  Past Medical History:  Diagnosis Date  . Abnormal echocardiogram 06/07/2019  . Acute on chronic combined systolic and diastolic HF (heart failure) ([place]) 10/26/2020  . Anoxic brain injury ([place]) 10/26/2020  . Arrhythmia   . Ascending aortic aneurysm (HCC) 4.2 cm based on CT from December 2020 01/29/2018   4.3 cm by echo in 2019  . Benign brain tumor ([place])   . Benign neoplasm of brain ([place]) 12/19/2017  . CAD in native artery residual disease of LAD, may need PCI, treating medically for now 10/26/2020  . Chest pain 12/04/2017  . Chronic kidney disease    Stage 3  . Chronic kidney disease, stage III (moderate) ([place]) 12/19/2017  . Diabetes mellitus, type 2 ([place]) 10/06/2020   10/06/20 A1C 7%  . Dizziness 10/11/2019  . Hematuria 10/26/2020  . History of pulmonary embolus (PE)   . Hypertension   . Iron deficiency anemia rec'd IV iron 10/26/2020  . Ischemic cardiomyopathy 10/26/2020  . Left bundle branch block 12/19/2017  . Nonsustained ventricular tachycardia ([place]) 10/11/2019  . Pain of left hip joint 05/29/2020  . Personal history of pulmonary embolism 12/19/2017  . Pulmonary hypertension due to thromboembolism ([place]) 03/09/2018   See echo 12/27/17 with nl PAS vs CTa chest 02/23/18   . S/P angioplasty with stent 10/04/20 DES to LCX  10/26/2020  . Sinus bradycardia 12/22/2017  . Solitary pulmonary nodule on lung CT 03/08/2018   CT 12/04/17 1.0 x 0.8 x 0.7 cm nodular opacity in the RUL vs not seen 03/16/10 posterior segment of the right upper lobe. Marland KitchenSpirometry 03/08/2018    FEV1 3.86 (108%)  Ratio 96 s prior rx  -  PET  03/13/18   Low grade c/w adenoca > rec T surgery eval    . Urinary retention 10/26/2020   Past Surgical History:  Past Surgical History:  Procedure Laterality Date  . APPENDECTOMY    . CORONARY/GRAFT ACUTE MI REVASCULARIZATION N/A 10/04/2020   Procedure: Coronary/Graft Acute MI Revascularization;  Surgeon: Nelva Bush, MD;  Location: [place] [facility];  Service: Cardiovascular;  Laterality: N/A;  . HERNIA REPAIR    . LEFT HEART CATH AND CORONARY ANGIOGRAPHY N/A 10/04/2020   Procedure: LEFT HEART CATH AND CORONARY ANGIOGRAPHY;  Surgeon: Nelva Bush, MD;  Location: [place] [facility];  Service: Cardiovascular;  Laterality: N/A;    Assessment / Plan / Recommendation Clinical Impression   Timothy Moran is a 75 year old male with history of multiple meningiomas, acoustic neuroma, CKDIII, PE, CHF with CM who was admitted on 10/04/20 after witnessed collapse,had agonal breathing with V Fib treated with shock X 4 and epi. Apparently patient developed chest pain while shoving snow, this persisted and wife activated EMS this was followed by collapse while EMS presented.Estimated downtime 3 minutes and EKG showed STEMI. He was started on IV amiodarone and he underwent cardiac cath revealing 2 vessel CAD with thrombotic occlusion of distal LCx-->treated with PCI with drug eluting stent . He was treated with cooling protocol and cards recommends ASA/tricagrelor X 12 months. Patient was unresponsive with nonpurposeful movements and EEG showed severe diffuse encephalopathy without seizures. CT  head shwoed stale left parafalcine and anterior left frontal meningiomas without mass effect. He tolerated extubation on 01/26 and lethargy resolved but he continued to have confusion with bouts of agitation.   He developed fevers with leucocytosis, metabolic acidosis, CHF and hypoxemia requiring BIPAP. He was started on IV antibiotics due to concerns of VAP/aspiration PNA, precedex as well as HFNC.  Dr. Patel/nephrology consulted for input felt that acute on chronic renal failure secondary to cardiorenal syndrome and recommended strict I/ O as well as plans for HD. He was found to have urinary retention therefore foley placed with return of 1.4 L urine and downward trend in BUN/SCr. Plans for HD cancelled with recommendations for strict I/O. He was also found to have DM with A1C-7.0 and BS in 200's being managed with SSI--amaryl added briefly but d/c due to BS drop to 70's.. Other issues include episode of hematuria,issues with insomnia and ongoing confusion--agitation has resolved. Patient continues to have cognitive deficits, weakness as well as chronic dizziness affecting mobility and functional status. CIR recommended due to functional decline. SLP evaluation completed on 10/30/2020 with results as follows:   Bedside Swallow Evaluation:  Pt presents with excellent toleration of regular textures and thin liquids.  He exhibits no overt s/s of aspiration with solids or liquids and he is able to clear boluses completely from the oral cavity without difficulty.  As a result, recommend that pt remain on his currently prescribed diet of regular textures and thin liquids.  No further ST needs indicated for dysphagia at this time.    Cognitive-linguistic Evaluation: Pt presents with mild cognitive deficits as supported by a score of 20/30 on the [place] Exam.  Results were most notable on clock drawing and delayed recall subtests.  Pt's performance is definitely impacted by fatigue resulting from his impaired sleep-wake cycle s/p prolonged hospitalization.  SLP provided skilled education regarding strategies to maximize alertness during the day and restfulness at night, including lights on/blinds open during the day, limiting daytime naps, participating in calmly stimulating activities during the day, etc.    Given the abovementioned results, pt would benefit from skilled ST  while inpatient in order to maximize functional independence and reduce burden of care prior to discharge.  Anticipate that pt may need ST services at next level of care.     Skilled Therapeutic Interventions          Cognitive-linguistic evaluation completed with results and recommendations reviewed with family.     SLP Assessment  Patient will need skilled Speech Lanaguage Pathology Services during CIR admission    Recommendations  SLP Diet Recommendations: Age appropriate regular solids;Thin Liquid Administration via: Cup;Straw Medication Administration: Whole meds with liquid Supervision: Patient able to self feed Compensations: Slow rate;Small sips/bites Postural Changes and/or Swallow Maneuvers: Seated upright 90 degrees Oral Care Recommendations: Oral care BID Recommendations for Other Services: Neuropsych consult Patient destination: Home Follow up Recommendations: Home Health SLP Equipment Recommended: None recommended by SLP    SLP Frequency 3 to 5 out of 7 days   SLP Duration  SLP Intensity  SLP Treatment/Interventions 14 days  Minumum of 1-2 x/day, 30 to 90 minutes  Cognitive remediation/compensation;Cueing hierarchy;Functional tasks;Patient/family education;Environmental controls;Speech/Language facilitation;Internal/external aids    Pain Pain Assessment Pain Scale: 0-10 Pain Score: 0-No pain  Prior Functioning Cognitive/Linguistic Baseline: Within functional limits Type of Home: House  Lives With: Spouse Available Help at Discharge: Family;Available 24 hours/day Vocation: Retired  Programmer, systems Overall Cognitive Status: Impaired/Different from  baseline Arousal/Alertness: Awake/alert Orientation Level: Oriented X4 Attention: Sustained Focused Attention: Impaired Sustained Attention: Impaired Sustained Attention Impairment: Functional basic Memory: Impaired Memory Impairment: Decreased recall of new information;Retrieval deficit Immediate  Memory Recall: Sock;Blue;Bed Memory Recall Sock: Without Cue Memory Recall Blue: Not able to recall Memory Recall Bed: Not able to recall Awareness: Impaired Awareness Impairment: Emergent impairment Problem Solving: Impaired Safety/Judgment: Impaired  Comprehension Auditory Comprehension Overall Auditory Comprehension: Appears within functional limits for tasks assessed Expression Expression Primary Mode of Expression: Verbal Verbal Expression Overall Verbal Expression: Appears within functional limits for tasks assessed Written Expression Dominant Hand: Right Oral Motor Oral Motor/Sensory Function Overall Oral Motor/Sensory Function: Within functional limits Motor Speech Overall Motor Speech: Appears within functional limits for tasks assessed  Care Tool Care Tool Cognition Expression of Ideas and Wants Expression of Ideas and Wants: Without difficulty (complex and basic) - expresses complex messages without difficulty and with speech that is clear and easy to understand   Understanding Verbal and Non-Verbal Content Understanding Verbal and Non-Verbal Content: Usually understands - understands most conversations, but misses some part/intent of message. Requires cues at times to understand   Memory/Recall Ability *first 3 days only Memory/Recall Ability *first 3 days only: Current season;That he or she is in a hospital/hospital unit     Bedside Swallowing Assessment General Previous Swallow Assessment: FEES 10/15/2020 Diet Prior to this Study: Regular;Thin liquids Temperature Spikes Noted: No Respiratory Status: Room air History of Recent Intubation: Yes Length of Intubations (days): 3 days Date extubated: 10/06/20 Behavior/Cognition: Alert;Cooperative;Pleasant mood Oral Cavity - Dentition: Adequate natural dentition Self-Feeding Abilities: Able to feed self Vision: Functional for self-feeding Patient Positioning: Upright in chair/Tumbleform Baseline Vocal Quality:  Normal Volitional Cough: Strong Volitional Swallow: Able to elicit  Oral Care Assessment   Ice Chips   Thin Liquid Thin Liquid: Within functional limits Nectar Thick   Honey Thick   Puree   Solid Solid: Within functional limits BSE Assessment Risk for Aspiration Impact on safety and function: Mild aspiration risk Other Related Risk Factors: Prolonged intubation;Deconditioning  Short Term Goals: Week 1: SLP Short Term Goal 1 (Week 1): Pt will use external memory aids to facilitate recall of daily information with supervision verbal cues. SLP Short Term Goal 2 (Week 1): Pt will complete mildly complex functional tasks wtih supervision verbal cues for functional problem solving. SLP Short Term Goal 3 (Week 1): Pt will recognize and correct errors in the moment during functional tasks with supervision verbal cues. SLP Short Term Goal 4 (Week 1): Pt will sustain his attention to functional tasks for 15 minute intervals with supervision verbal cues for redirection.  Refer to Care Plan for Long Term Goals  Recommendations for other services: Neuropsych  Discharge Criteria: Patient will be discharged from SLP if patient refuses treatment 3 consecutive times without medical reason, if treatment goals not met, if there is a change in medical status, if patient makes no progress towards goals or if patient is discharged from hospital.  The above assessment, treatment plan, treatment alternatives and goals were discussed and mutually agreed upon: by patient and by family  , Selinda Orion 10/30/2020, 2:23 PM

## 2020-10-30 NOTE — Progress Notes (Signed)
Inpatient Rehabilitation  Patient information reviewed and entered into eRehab system by Dennisha Mouser M. Arlando Leisinger, M.A., CCC/SLP, PPS Coordinator.  Information including medical coding, functional ability and quality indicators will be reviewed and updated through discharge.    

## 2020-10-30 NOTE — Progress Notes (Addendum)
PROGRESS NOTE   Subjective/Complaints: Didn't sleep that well. Days and nights have run together a bit since being in the hospital. Became very anxious while up with PT this morning. Wasn't sure exactly while. Says he's not an anxious person. Appetite fair  ROS: Patient denies fever, rash, sore throat, blurred vision, nausea, vomiting, diarrhea, cough,   chest pain, joint or back pain, headache, or mood change.    Objective:   No results found. Recent Labs    10/30/20 0504  WBC 8.4  HGB 9.0*  HCT 27.3*  PLT 333   Recent Labs    10/28/20 0130 10/30/20 0504  NA 142 139  K 4.2 3.9  CL 109 105  CO2 20* 22  GLUCOSE 82 117*  BUN 34* 28*  CREATININE 3.01* 2.83*  CALCIUM 8.4* 8.5*    Intake/Output Summary (Last 24 hours) at 10/30/2020 1137 Last data filed at 10/30/2020 0830 Gross per 24 hour  Intake 120 ml  Output 450 ml  Net -330 ml        Physical Exam: Vital Signs Blood pressure 123/76, pulse 65, temperature 98.6 F (37 C), temperature source Oral, resp. rate 17, height 6\' 1"  (1.854 m), weight 98.4 kg, SpO2 94 %.  General: Alert and oriented x 3, No apparent distress HEENT: Head is normocephalic, atraumatic, PERRLA, EOMI, sclera anicteric, oral mucosa pink and moist, dentition intact, ext ear canals clear,  Neck: Supple without JVD or lymphadenopathy Heart: Reg rate and rhythm. No murmurs rubs or gallops Chest: CTA bilaterally without wheezes, rales, or rhonchi; no distress Abdomen: Soft, non-tender, non-distended, bowel sounds positive. Extremities: No clubbing, cyanosis, or edema. Pulses are 2+ Psych: Pt's affect is appropriate. Pt is cooperative. A little flat Skin: Clean and intact without signs of breakdown Neuro: reasonable insight and memory. Sometimes a little slow to process. normal language and speech. STM spotty. Cranial nerves 2-12 are intact. Sensory exam is normal. Reflexes are 2+ in all 4's.  Fine motor coordination is intact. No tremors. Motor function is grossly 4+/5 UE and 4- to 4/5 LE's.  Musculoskeletal: Full ROM, No pain with AROM or PROM in the neck, trunk, or extremities. Posture appropriate     Assessment/Plan: 1. Functional deficits which require 3+ hours per day of interdisciplinary therapy in a comprehensive inpatient rehab setting.  Physiatrist is providing close team supervision and 24 hour management of active medical problems listed below.  Physiatrist and rehab team continue to assess barriers to discharge/monitor patient progress toward functional and medical goals  Care Tool:  Bathing  Bathing activity did not occur: Refused           Bathing assist       Upper Body Dressing/Undressing Upper body dressing        Upper body assist      Lower Body Dressing/Undressing Lower body dressing            Lower body assist       Toileting Toileting Toileting Activity did not occur (Clothing management and hygiene only): Refused  Toileting assist       Transfers Chair/bed transfer  Transfers assist     Chair/bed transfer assist level: Minimal Assistance -  Patient > 75%     Locomotion Ambulation   Ambulation assist              Walk 10 feet activity   Assist           Walk 50 feet activity   Assist           Walk 150 feet activity   Assist           Walk 10 feet on uneven surface  activity   Assist           Wheelchair     Assist               Wheelchair 50 feet with 2 turns activity    Assist            Wheelchair 150 feet activity     Assist          Blood pressure 123/76, pulse 65, temperature 98.6 F (37 C), temperature source Oral, resp. rate 17, height 6\' 1"  (1.854 m), weight 98.4 kg, SpO2 94 %.  Medical Problem List and Plan: 1.  Anoxic brain injury secondary to VF cardiac arrest with EF of 20-25% requiring Lifevest             -patient may shower               -ELOS/Goals: 10-14 days- mod I to supervision- 2.  Antithrombotics: -DVT/anticoagulation:  Pharmaceutical: Lovenox--added 02/17               -antiplatelet therapy: ASA/Brillinta  3. Pain Management: Oxycodone prn for chest wall pain.  4. Mood: LCSW to follow for evaluation and support.              -antipsychotic agents: N/a  -2/18 observe for any more anxiety episodes. Pt says this is out of character for him. Hopefully he won't have many more problems as he meets his therapists and is out of bed more.  5. Neuropsych: This patient is not capable of making decisions on his own behalf. 6. Skin/Wound Care: Routine pressure relief measures. 7. Fluids/Electrolytes/Nutrition: Monitor I/O.  .              --Added Ensure max to help with protein stores--->albumin 2.4.  8. CAD s/p PCI/stent: On ASA/Ticagrelor Lipitor, Isordil, hydralazine and metoprolol.             --monitor for symptoms with increase in activity.   -CV parameters stable today 9. VT arrest: Monitor HR tid--on metoprolol daily.             --LifeVest placed on 02/17--may remove to shower with RN or therapist present 10. T2DM--new diagnosis: Hgb A1c-7.               --continue to monitor BS ac/hs and use SSI as indicated.              --not a candidate for metformin with CKD.              --CBG's controlled at present but hasn't been eating much until this morning 11.BPH/ Urinary retention/hematuria: Foley replaced on 02/17-              --Remove foley today and start bladder training, check PVR's   -Has seen Dr. Matilde Sprang in the past             --continue Flomax.      -UA +/-, await ucx  12. Acoustic neuroma and other menigioma-  loss of hearing on R? side: Chronic dizziness/HA for months per records review.              --followed by Dr. Dema Severin at [facility] (was scheduled for Gamma knife radiosurgery) 13. Acute on chronic renal failure Stage IIIa: Avoid nephrotoxic medications. SCr was 1.38 at admission and has  improved from the peak of 8.02-->>3.01             -Cr 2.8 2/18             --Continue bicarb for metabolic acidosis.  14. Anemia of chronic disease: Monitor H/H for trends ---11.2-->9.0             --low iron/TIBC with elevated Ferritin-->             --received Nulecit X 3 doses and Aranesp  SQ 02/09.     -hgb 9.0 2/18--stable  LOS: 1 days A FACE TO FACE EVALUATION WAS PERFORMED  Meredith Staggers 10/30/2020, 11:37 AM

## 2020-10-30 NOTE — IPOC Note (Signed)
Overall Plan of Care [facility]) Patient Details Name: Athanasius Kesling MRN: [id number] DOB: 05-11-1946  Admitting Diagnosis: Anoxic brain injury (HCC)Anoxic brain injury  Hospital Problems: Principal Problem:   Anoxic brain injury [facility])     Functional Problem List: Nursing Endurance,Medication Management,Nutrition,Pain,Skin Integrity,Sensory,Safety  PT Balance,Behavior,Endurance,Motor,Safety,Perception  OT Balance,Cognition,Endurance,Pain,Safety  SLP Cognition  TR         Basic ADL's: OT Grooming,Bathing,Dressing,Toileting     Advanced  ADL's: OT       Transfers: PT Bed Mobility,Bed to Chair,Car,Furniture  OT Toilet,Tub/Shower     Locomotion: PT Ambulation,Stairs     Additional Impairments: OT    SLP Social Cognition   Problem Solving,Memory,Attention,Awareness  TR      Anticipated Outcomes Item Anticipated Outcome  Self Feeding no goal  Swallowing      Basic self-care  S  Toileting  S   Bathroom Transfers S  Bowel/Bladder  Continent with min assist.  Transfers  supervision  Locomotion  supervision  Communication     Cognition  mod I  Pain  pain less than 3  Safety/Judgment  No falls during this admission.   Therapy Plan: PT Intensity: Minimum of 1-2 x/day ,45 to 90 minutes PT Duration Estimated Length of Stay: 14 days OT Intensity: Minimum of 1-2 x/day, 45 to 90 minutes OT Frequency: 5 out of 7 days OT Duration/Estimated Length of Stay: 10-14 SLP Intensity: Minumum of 1-2 x/day, 30 to 90 minutes SLP Frequency: 3 to 5 out of 7 days SLP Duration/Estimated Length of Stay: 14 days   Due to the current state of emergency, patients may not be receiving their 3-hours of Medicare-mandated therapy.   Team Interventions: Nursing Interventions Patient/Family Education,Bladder Management,Bowel Management,Disease Management/Prevention,Pain Management,Medication Management,Skin Care/Wound Health visitor  PT interventions Ambulation/gait  training,DME/adaptive equipment instruction,Neuromuscular re-education,Psychosocial support,Stair training,UE/LE Strength taining/ROM,Wheelchair propulsion/positioning,Balance/vestibular training,Discharge planning,Therapeutic Activities,UE/LE Coordination activities,Cognitive remediation/compensation,Disease management/prevention,Functional mobility training,Patient/family education,Therapeutic Exercise  OT Interventions Balance/vestibular training,Discharge planning,Pain management,Self Care/advanced ADL retraining,Therapeutic Activities,UE/LE Coordination activities,Therapeutic Exercise,Skin care/wound managment,Patient/family education,Functional mobility training,Disease mangement/prevention,Cognitive remediation/compensation,Community Corporate treasurer re-education,Psychosocial support,Splinting/orthotics,UE/LE Strength taining/ROM,Wheelchair propulsion/positioning  SLP Interventions Cognitive remediation/compensation,Cueing hierarchy,Functional tasks,Patient/family education,Environmental controls,Speech/Language facilitation,Internal/external aids  TR Interventions    SW/CM Interventions Discharge Planning,Patient/Family Education,Psychosocial Support   Barriers to Discharge MD  Medical stability  Nursing      PT Inaccessible home environment,Behavior anxiety  OT Other (comments) anxiety  SLP      SW Decreased caregiver support,Lack of/limited family support     Team Discharge Planning: Destination: PT-Home ,OT- Home , SLP-Home Projected Follow-up: PT-Home health PT, OT-  Home health OT, SLP-Home Health SLP Projected Equipment Needs: PT-To be determined, OT- To be determined,3 in 1 bedside comode, SLP-None recommended by SLP Equipment Details: PT-has SPC per wife, OT-likely none Patient/family involved in discharge planning: PT- Diplomatic Services operational officer,  OT-Patient,Family Midwife, Civil Service fast streamer  MD  ELOS: 12-14 days Medical Rehab Prognosis:  Excellent Assessment: The patient has been admitted for CIR therapies with the diagnosis of anoxic brain injury after cardiac arrest. The team will be addressing functional mobility, strength, stamina, balance, safety, adaptive techniques and equipment, self-care, bowel and bladder mgt, patient and caregiver education, NMR, activity tolerance, memory, cognition. Goals have been set at supervision for mobility and self-care, and mod I to supervision with SLP.   Due to the current state of emergency, patients may not be receiving their 3 hours per day of Medicare-mandated therapy.    Meredith Staggers, MD, [facility]      See Team Conference Notes for weekly updates to the  plan of care

## 2020-10-30 NOTE — Progress Notes (Signed)
Foley removed per order, patient tolerated well, no complaints of pain. Patient resting in bed with call bell within reach.

## 2020-10-30 NOTE — Evaluation (Signed)
Occupational Therapy Assessment and Plan  Patient Details  Name: Timothy Moran MRN: [id number] Date of Birth: [birth year]  OT Diagnosis: abnormal posture, acute pain, cognitive deficits, muscle weakness (generalized) and pain in joint Rehab Potential: Rehab Potential (ACUTE ONLY): Good ELOS: 10-14   Today's Date: 10/30/2020 OT Individual Time: 1000-1055 OT Individual Time Calculation (min): 55 min     Hospital Problem: Active Problems:   Anoxic brain injury [facility])   Past Medical History:  Past Medical History:  Diagnosis Date  . Abnormal echocardiogram 06/07/2019  . Acute on chronic combined systolic and diastolic HF (heart failure) ([place]) 10/26/2020  . Anoxic brain injury ([place]) 10/26/2020  . Arrhythmia   . Ascending aortic aneurysm (HCC) 4.2 cm based on CT from December 2020 01/29/2018   4.3 cm by echo in 2019  . Benign brain tumor ([place])   . Benign neoplasm of brain ([place]) 12/19/2017  . CAD in native artery residual disease of LAD, may need PCI, treating medically for now 10/26/2020  . Chest pain 12/04/2017  . Chronic kidney disease    Stage 3  . Chronic kidney disease, stage III (moderate) ([place]) 12/19/2017  . Diabetes mellitus, type 2 ([place]) 10/06/2020   10/06/20 A1C 7%  . Dizziness 10/11/2019  . Hematuria 10/26/2020  . History of pulmonary embolus (PE)   . Hypertension   . Iron deficiency anemia rec'd IV iron 10/26/2020  . Ischemic cardiomyopathy 10/26/2020  . Left bundle branch block 12/19/2017  . Nonsustained ventricular tachycardia ([place]) 10/11/2019  . Pain of left hip joint 05/29/2020  . Personal history of pulmonary embolism 12/19/2017  . Pulmonary hypertension due to thromboembolism ([place]) 03/09/2018   See echo 12/27/17 with nl PAS vs CTa chest 02/23/18   . S/P angioplasty with stent 10/04/20 DES to LCX  10/26/2020  . Sinus bradycardia 12/22/2017  . Solitary pulmonary nodule on lung CT 03/08/2018   CT 12/04/17 1.0 x 0.8 x 0.7 cm nodular opacity in the RUL vs not seen 03/16/10 posterior  segment of the right upper lobe. Marland KitchenSpirometry 03/08/2018    FEV1 3.86 (108%)  Ratio 96 s prior rx  - PET  03/13/18   Low grade c/w adenoca > rec T surgery eval    . Urinary retention 10/26/2020   Past Surgical History:  Past Surgical History:  Procedure Laterality Date  . APPENDECTOMY    . CORONARY/GRAFT ACUTE MI REVASCULARIZATION N/A 10/04/2020   Procedure: Coronary/Graft Acute MI Revascularization;  Surgeon: Nelva Bush, MD;  Location: [place] [facility];  Service: Cardiovascular;  Laterality: N/A;  . HERNIA REPAIR    . LEFT HEART CATH AND CORONARY ANGIOGRAPHY N/A 10/04/2020   Procedure: LEFT HEART CATH AND CORONARY ANGIOGRAPHY;  Surgeon: Nelva Bush, MD;  Location: [employer] [facility];  Service: Cardiovascular;  Laterality: N/A;    Assessment & Plan Clinical Impression:Pt is 75 year old male with a history of LBBB, mildly reduced LVEF, 4.2cm TAA, multiple symptomatic meningiomas (possible NF2 suspected), CKD stage III, two prior pulmonary embolisms (now off Eliquis), HTN, and pulmonary nodules, who presented as a resuscitated VF arrest at home and was found to have inferior ST elevation on ECG. He began having chest pain while shoveling snow this afternoon. He collapsed and lost consciousness with no obvious injuries in the fall. No bystander CPR performed. He was found with agonal breathing and an AED-shockable rhythm. He received a total of 4 defibrillations by fire and EMS.  Patient is more alert, but remains confused with decreased awareness and safety.  Pt s/p cardiac cath with stent on 1/23 and was intubated until 1/25.   Patient currently requires mod with basic self-care skills secondary to muscle weakness, decreased cardiorespiratoy endurance, decreased awareness, decreased problem solving, decreased safety awareness and decreased memory and decreased standing balance, decreased postural control and decreased balance strategies.  Prior to hospitalization, patient could complete  ADL/IADL with independent .  Patient will benefit from skilled intervention to decrease level of assist with basic self-care skills and increase independence with basic self-care skills prior to discharge home with care partner.  Anticipate patient will require 24 hour supervision and follow up outpatient.  OT - End of Session Activity Tolerance: Tolerates < 10 min activity, no significant change in vital signs Endurance Deficit: Yes OT Assessment Rehab Potential (ACUTE ONLY): Good OT Barriers to Discharge: Other (comments) OT Barriers to Discharge Comments: anxiety OT Patient demonstrates impairments in the following area(s): Balance;Cognition;Endurance;Pain;Safety OT Basic ADL's Functional Problem(s): Grooming;Bathing;Dressing;Toileting OT Transfers Functional Problem(s): Toilet;Tub/Shower OT Plan OT Intensity: Minimum of 1-2 x/day, 45 to 90 minutes OT Frequency: 5 out of 7 days OT Duration/Estimated Length of Stay: 10-14 OT Treatment/Interventions: Balance/vestibular training;Discharge planning;Pain management;Self Care/advanced ADL retraining;Therapeutic Activities;UE/LE Coordination activities;Therapeutic Exercise;Skin care/wound managment;Patient/family education;Functional mobility training;Disease mangement/prevention;Cognitive remediation/compensation;Community reintegration;DME/adaptive equipment instruction;Neuromuscular re-education;Psychosocial support;Splinting/orthotics;UE/LE Strength taining/ROM;Wheelchair propulsion/positioning OT Self Feeding Anticipated Outcome(s): no goal OT Basic Self-Care Anticipated Outcome(s): S OT Toileting Anticipated Outcome(s): S OT Bathroom Transfers Anticipated Outcome(s): S OT Recommendation Patient destination: Home Follow Up Recommendations: Home health OT Equipment Recommended: To be determined;3 in 1 bedside comode Equipment Details: likely none   OT Evaluation Precautions/Restrictions  Precautions Precautions: Fall Precaution  Comments: cardiac monitor Restrictions Weight Bearing Restrictions: No General Chart Reviewed: Yes Family/Caregiver Present: Yes Vital Signs   Pain Pain Assessment Pain Scale: 0-10 Pain Score: 0-No pain Home Living/Prior Functioning Home Living Family/patient expects to be discharged to:: Private residence Living Arrangements: Spouse/significant other Available Help at Discharge: Family,Available 24 hours/day Type of Home: House Home Access: Stairs to enter [employer] of Steps: 6 Entrance Stairs-Rails: Right Home Layout: Two level,Able to live on main level with bedroom/bathroom Alternate Level Stairs-Number of Steps: full flight Bathroom Shower/Tub: Walk-in shower,Door (built in Garment/textile technologist) Firefighter: Handicapped height Bathroom Accessibility: Yes Additional Comments: grab bars; water closet  Lives With: Spouse Prior Function Level of Independence: Independent with basic ADLs,Independent with gait,Independent with homemaking with ambulation,Independent with transfers  Able to Take Stairs?: Yes Driving: Yes Vocation: Retired Leisure: Hobbies-yes (Comment) Comments: yard work, Industrial/product designer things Vision Baseline Vision/History: Wears glasses Wears Glasses: At all times Patient Visual Report: No change from baseline Vision Assessment?: No apparent visual deficits Additional Comments: pt has double vision at baseline and prisms in lenses Perception  Perception: Within Functional Limits Praxis Praxis: Intact Cognition Overall Cognitive Status: Impaired/Different from baseline Arousal/Alertness: Awake/alert Orientation Level: Person;Place;Situation Person: Oriented Place: Oriented Year: 2022 Month: February Day of Week: Incorrect Immediate Memory Recall: Sock;Blue;Bed Memory Recall Sock: Without Cue Memory Recall Blue: Not able to recall Memory Recall Bed: Not able to recall Attention: Focused Focused Attention:  Impaired Safety/Judgment: Impaired Sensation Sensation Light Touch: Appears Intact Proprioception: Appears Intact Coordination Gross Motor Movements are Fluid and Coordinated: No Fine Motor Movements are Fluid and Coordinated: No Motor  Motor Motor: Within Functional Limits  Trunk/Postural Assessment  Cervical Assessment Cervical Assessment:  (head forward) Thoracic Assessment Thoracic Assessment:  (rounded shoudlers) Lumbar Assessment Lumbar Assessment:  (post pelvic tilt) Postural Control Postural Control: Deficits on evaluation (insufficient)  Balance Balance Balance Assessed:  Yes Dynamic Sitting Balance Dynamic Sitting - Level of Assistance: 5: Stand by assistance Sitting balance - Comments: Prefers UE support at EOB, supervision for safety. Static Standing Balance Static Standing - Level of Assistance: 4: Min assist;3: Mod assist Extremity/Trunk Assessment RUE Assessment General Strength Comments: generalized weakness LUE Assessment General Strength Comments: generalized weakness  Care Tool Care Tool Self Care Eating        Oral Care         Bathing Bathing activity did not occur: Refused            Upper Body Dressing(including orthotics)            Lower Body Dressing (excluding footwear)          Putting on/Taking off footwear             Care Tool Toileting Toileting activity Toileting Activity did not occur (Clothing management and hygiene only): Refused       Care Tool Bed Mobility Roll left and right activity        Sit to lying activity        Lying to sitting edge of bed activity         Care Tool Transfers Sit to stand transfer   Sit to stand assist level: Minimal Assistance - Patient > 75%    Chair/bed transfer   Chair/bed transfer assist level: Minimal Assistance - Patient > 75%     Toilet transfer Toilet transfer activity did not occur: Refused       Care Tool Cognition Expression of Ideas and Wants      Understanding Verbal and Non-Verbal Content     Memory/Recall Ability *first 3 days only      Refer to Care Plan for Long Term Goals  SHORT TERM GOAL WEEK 1 OT Short Term Goal 1 (Week 1): pt will transfer to toilet wiht CGA consistently OT Short Term Goal 2 (Week 1): pt will stand for clothing managment wiht no symptoms of dizziness OT Short Term Goal 3 (Week 1): Pt will use breathing techniques to manage anxiety PRN with no more than min cues OT Short Term Goal 4 (Week 1): Pt will don shirt wiht set up  Recommendations for other services: Neuropsych and Therapeutic Recreation  Pet therapy and Stress management   Skilled Therapeutic Intervention  1:1. Pt and wife present in room. Pt seemingly mid panic attack as pt states he is unable to breath and chest feels tight/painful. OT checks vitals and WNL. Pt also very uncomfortable in recliner and OT provides MIN A to stand pivot to which pt nearly throws himself back onto bed. Pt guided through pursed lip breathing into, ujjayi breathe (longer exhale though nose like fogging up a mirror to calm CNS) and educated on alternate nostril breathing as another technique to improve anxiety. Wife verbalized understanding and eventually pt able to use ujjayi breath without cuing to calm himself. Pt requires up to 30 min to relax and breathe at natural rate with OT educating on adition of calming stimuli (beach wave sounds, light acoustic music etc) to assist with managing anxiety as well as bringing in nueropsych to assist with coping. Pt remains comfortable in bed at end of session. Exited session with pt seated in bed, exit alarm on and call light in reach   Mobility  Bed Mobility Bed Mobility: (P) Rolling Right;Rolling Left;Right Sidelying to Sit;Sit to Supine Transfers Sit to Stand: Minimal Assistance - Patient > 75% Stand to Sit: Minimal  Assistance - Patient > 75%   Discharge Criteria: Patient will be discharged from OT if patient refuses  treatment 3 consecutive times without medical reason, if treatment goals not met, if there is a change in medical status, if patient makes no progress towards goals or if patient is discharged from hospital.  The above assessment, treatment plan, treatment alternatives and goals were discussed and mutually agreed upon: by patient  Tonny Branch 10/30/2020, 11:00 AM

## 2020-10-30 NOTE — Progress Notes (Signed)
Inpatient Rehabilitation Care Coordinator Assessment and Plan Patient Details  Name: Timothy Moran MRN: [id number] Date of Birth: 11-Aug-1946  Today's Date: 10/30/2020  Hospital Problems: Principal Problem:   Anoxic brain injury [facility])  Past Medical History:  Past Medical History:  Diagnosis Date  . Abnormal echocardiogram 06/07/2019  . Acute on chronic combined systolic and diastolic HF (heart failure) ([place]) 10/26/2020  . Anoxic brain injury ([place]) 10/26/2020  . Arrhythmia   . Ascending aortic aneurysm (HCC) 4.2 cm based on CT from December 2020 01/29/2018   4.3 cm by echo in 2019  . Benign brain tumor ([place])   . Benign neoplasm of brain ([place]) 12/19/2017  . CAD in native artery residual disease of LAD, may need PCI, treating medically for now 10/26/2020  . Chest pain 12/04/2017  . Chronic kidney disease    Stage 3  . Chronic kidney disease, stage III (moderate) ([place]) 12/19/2017  . Diabetes mellitus, type 2 ([place]) 10/06/2020   10/06/20 A1C 7%  . Dizziness 10/11/2019  . Hematuria 10/26/2020  . History of pulmonary embolus (PE)   . Hypertension   . Iron deficiency anemia rec'd IV iron 10/26/2020  . Ischemic cardiomyopathy 10/26/2020  . Left bundle branch block 12/19/2017  . Nonsustained ventricular tachycardia ([place]) 10/11/2019  . Pain of left hip joint 05/29/2020  . Personal history of pulmonary embolism 12/19/2017  . Pulmonary hypertension due to thromboembolism ([place]) 03/09/2018   See echo 12/27/17 with nl PAS vs CTa chest 02/23/18   . S/P angioplasty with stent 10/04/20 DES to LCX  10/26/2020  . Sinus bradycardia 12/22/2017  . Solitary pulmonary nodule on lung CT 03/08/2018   CT 12/04/17 1.0 x 0.8 x 0.7 cm nodular opacity in the RUL vs not seen 03/16/10 posterior segment of the right upper lobe. Marland KitchenSpirometry 03/08/2018    FEV1 3.86 (108%)  Ratio 96 s prior rx  - PET  03/13/18   Low grade c/w adenoca > rec T surgery eval    . Urinary retention 10/26/2020   Past Surgical History:  Past Surgical History:   Procedure Laterality Date  . APPENDECTOMY    . CORONARY/GRAFT ACUTE MI REVASCULARIZATION N/A 10/04/2020   Procedure: Coronary/Graft Acute MI Revascularization;  Surgeon: Nelva Bush, MD;  Location: [place] [facility];  Service: Cardiovascular;  Laterality: N/A;  . HERNIA REPAIR    . LEFT HEART CATH AND CORONARY ANGIOGRAPHY N/A 10/04/2020   Procedure: LEFT HEART CATH AND CORONARY ANGIOGRAPHY;  Surgeon: Nelva Bush, MD;  Location: [place] [facility];  Service: Cardiovascular;  Laterality: N/A;   Social History:  reports that he quit smoking about 52 years ago. His smoking use included cigarettes. He has a 2.50 pack-year smoking history. He has never used smokeless tobacco. He reports current alcohol use. He reports that he does not use drugs.  Family / Support Systems Marital Status: Married How Long?: 23 years Patient Roles: Spouse,Parent Spouse/Significant Other: Coralie Common (wife): [phone] Children: 1 adult dtr lives in [place], [place] Other Supports: friends Anticipated Caregiver: wife Ability/Limitations of Caregiver: 24/7 care Caregiver Availability: 24/7 Family Dynamics: Pt is retired and lives with his wife.  Social History Preferred language: English Religion:  Cultural Background: Pt worked as an Arboriculturist for 50 years Education: college grad Read: Yes Write: Yes Employment Status: Retired Date Retired/Disabled/Unemployed: 2012 Age Retired: 64 Public relations account executive Issues: Denies Guardian/Conservator: N/A   Abuse/Neglect Abuse/Neglect Assessment Can Be Completed: Yes Physical Abuse: Denies Verbal Abuse: Denies Sexual Abuse: Denies Exploitation of patient/patient's resources: Denies Self-Neglect:  Denies  Emotional Status Pt's affect, behavior and adjustment status: Pt had melancholic disposition as he appeared to be disappointed on how he performed in therapy today due to dizziness. Recent Psychosocial Issues: Denies Psychiatric History:  Denies Substance Abuse History: Pt admits he quit smoking ciagerettees 50 years ago; and once week bourbon mixed drink.  Patient / Family Perceptions, Expectations & Goals Pt/Family understanding of illness & functional limitations: Pt and family have a general understanding of care needs Premorbid pt/family roles/activities: independent Anticipated changes in roles/activities/participation: Assistance with ADLs/IADLs Pt/family expectations/goals: pt goal is to "get physically fit again; get over dizziness due to tumor in R eer."  [employer]: None Premorbid Home Care/DME Agencies: None Transportation available at discharge: wife Resource referrals recommended: Neuropsychology  Discharge Planning Living Arrangements: Spouse/significant other Support Systems: Spouse/significant other,Friends/neighbors Type of Residence: Private residence Insurance underwriter Resources: [employer] (specify) ([employer]) Museum/gallery curator Resources: Therapist, art Screen Referred: No Living Expenses: Own Money Management: Spouse Does the patient have any problems obtaining your medications?: No Home Management: Wife managed inside home need such as cooking and cleaning. Pt would perform yardwork duties, Artist. Patient/Family Preliminary Plans: TBD Care Coordinator Barriers to Discharge: Decreased caregiver support,Lack of/limited family support Care Coordinator Anticipated Follow Up Needs: HH/OP  Clinical Disposition: SW met with pt and pt wife in room to introduce self, explain role, and discuss discharge process. Pt is not a English as a second language teacher. No HCPOA- would like information. DME: cane (does not use) and zoll vest. Wife reports that there are 6 steps to enter the home from each entrance, and pt will need to be able to get into the home. They are hopeful a ramp is not needed.                                                                                                                                                                                                       Holbert Caples A Johnjoseph Rolfe 10/30/2020, 2:35 PM

## 2020-10-31 LAB — URINE CULTURE: Culture: <10000 — AB

## 2020-10-31 LAB — GLUCOSE, CAPILLARY
Glucose-Capillary: 101 mg/dL — ABNORMAL HIGH (ref 70–99)
Glucose-Capillary: 156 mg/dL — ABNORMAL HIGH (ref 70–99)
Glucose-Capillary: 104 mg/dL — ABNORMAL HIGH (ref 70–99)
Glucose-Capillary: 132 mg/dL — ABNORMAL HIGH (ref 70–99)

## 2020-10-31 NOTE — Progress Notes (Signed)
Physical Therapy Session Note  Patient Details  Name: Timothy Moran MRN: [id number] Date of Birth: Jul 11, 1946  Today's Date: 10/31/2020 PT Individual Time: 0800-0915 PT Individual Time Calculation (min): 75 min   Short Term Goals: Week 1:  PT Short Term Goal 1 (Week 1): Pt will tolerate assessment of stairs PT Short Term Goal 2 (Week 1): Pt will tolerate assessment of car transfers PT Short Term Goal 3 (Week 1): Pt will ambulate 18ft w/RW and cga PT Short Term Goal 4 (Week 1): Pt will perform bed to/from wc w/RW and cga  Skilled Therapeutic Interventions/Progress Updates:  Patient standing at sink with OT upon PT arrival to room. Pt finishes task with OT and sits in w/c to complete brushing teeth. RN arrives to provide am medicines. Patient alert and agreeable to continue into PT session following meds. Patient denied pain throughout PT session. Mainly struggles with HR and respiratory rate during session with efforts requiring extensive seated therapeutic rest breaks throughout.  Therapeutic Activity: Transfers: Patient performed STS and SPVT transfers with CGA/ min A.  Provided verbal cues for hand placement to armrests for push-to-stand technique and forward lean. Guided in car transfer with height set to pt's requested height for wife's car. Using RW, pt is able to complete SPVT with CGA, requires Min A for bringing BLE into car. Return to w/c CGA.   Gait Training:  Patient ambulated 10' x 2/ 25' x1 using RW with CGA. Ambulated with very slow pace, low step height/ length, reduced self efficacy, and increased BUE pressure into RW. Provided verbal cues for upright posture, maintaining level gaze, utilizing slow, deep breaths.  Pt verbally instructed with visual demonstration on negotiation of 4" curb step. Pt willing to attempt. Pt guided in performance and requires vc for technique throughout and Min A for balance as well as with assist for powering up with step using RLE. Seated rest  break required following performance with HR at 124 and respiratory rate reaching 30 breaths/ min but reduces to pt's resting ~20-22 breaths/ min throughout session.   Neuromuscular Re-ed: Standing balance challenged with performance of Berg Balance test. Results below. Pt required use of RW for balance and demo'd increase in anxiety with request to perform any standing activity without use of any UE support.   Balance: Standardized Balance Assessment Standardized Balance Assessment: Berg Balance Test Berg Balance Test Sit to Stand: Able to stand using hands after several tries Standing Unsupported: Unable to stand 30 seconds unassisted Sitting with Back Unsupported but Feet Supported on Floor or Stool: Able to sit 2 minutes under supervision Stand to Sit: Uses backs of legs against chair to control descent Transfers: Needs one person to assist Standing Unsupported with Eyes Closed: Unable to keep eyes closed 3 seconds but stays steady Standing Ubsupported with Feet Together: Needs help to attain position and unable to hold for 15 seconds From Standing, Reach Forward with Outstretched Arm: Loses balance while trying/requires external support From Standing Position, Pick up Object from Floor: Unable to try/needs assist to keep balance From Standing Position, Turn to Look Behind Over each Shoulder: Needs supervision when turning Turn 360 Degrees: Needs assistance while turning Standing Unsupported, Alternately Place Feet on Step/Stool: Needs assistance to keep from falling or unable to try Standing Unsupported, One Foot in Front: Loses balance while stepping or standing Standing on One Leg: Unable to try or needs assist to prevent fall Total Score: 10  Patient  at end of session with brakes locked, belt alarm  set, and all needs within reach.    Therapy Documentation Precautions:  Precautions Precautions: Fall Precaution Comments: cardiac monitor Restrictions Weight Bearing  Restrictions: No    Therapy/Group: Individual Therapy  [name] 10/31/2020, 12:25 PM

## 2020-10-31 NOTE — Progress Notes (Signed)
Patient was taken to the restroom, but unable to void. Bladder scan performed showed 0. Patient denies feeling full. Patient and wife refused in and out cathing at this time.

## 2020-10-31 NOTE — Plan of Care (Signed)
  Problem: Consults Goal: RH GENERAL PATIENT EDUCATION Description: See Patient Education module for education specifics. Outcome: Progressing Goal: Diabetes Guidelines if Diabetic/Glucose > 140 Description: If diabetic or lab glucose is > 140 mg/dl - Initiate Diabetes/Hyperglycemia Guidelines & Document Interventions  Outcome: Progressing   Problem: RH BOWEL ELIMINATION Goal: RH STG MANAGE BOWEL WITH ASSISTANCE Description: STG Manage Bowel with Assistance. Outcome: Progressing   Problem: RH BLADDER ELIMINATION Goal: RH STG MANAGE BLADDER WITH ASSISTANCE Description: STG Manage Bladder With Assistance Outcome: Progressing   Problem: RH SKIN INTEGRITY Goal: RH STG SKIN FREE OF INFECTION/BREAKDOWN Outcome: Progressing Goal: RH STG MAINTAIN SKIN INTEGRITY WITH ASSISTANCE Description: STG Maintain Skin Integrity With Assistance. Outcome: Progressing   Problem: RH SAFETY Goal: RH STG ADHERE TO SAFETY PRECAUTIONS W/ASSISTANCE/DEVICE Description: STG Adhere to Safety Precautions With Assistance/Device. Outcome: Progressing   Problem: RH PAIN MANAGEMENT Goal: RH STG PAIN MANAGED AT OR BELOW PT'S PAIN GOAL Outcome: Progressing

## 2020-10-31 NOTE — Progress Notes (Signed)
Occupational Therapy Session Note  Patient Details  Name: Timothy Moran MRN: [id number] Date of Birth: 06/29/1946  Today's Date: 10/31/2020 Session 1 OT Individual Time: 5638-7564 OT Individual Time Calculation (min): 27 min   Session 2 OT Individual Time: 3329-5188 OT Individual Time Calculation (min): 55 min    Short Term Goals: Week 1:  OT Short Term Goal 1 (Week 1): pt will transfer to toilet wiht CGA consistently OT Short Term Goal 2 (Week 1): pt will stand for clothing managment wiht no symptoms of dizziness OT Short Term Goal 3 (Week 1): Pt will use breathing techniques to manage anxiety PRN with no more than min cues OT Short Term Goal 4 (Week 1): Pt will don shirt wiht set up  Skilled Therapeutic Interventions/Progress Updates:    Session 1 Pt greeted seated on commode in bathroom, but was unable to have a BM. Pt completed sit<>stand from commode with min A, then ambulated out of bathroom w/ RW and CGA> Educated on RW placement at the sink to wash his hands. Pt tolerated standing for 30 seconds then needed to sit. Bathing/dressing completed from wc at the sink. UB bathing and dressing with set-up A and increased time. Pt with increased dizziness with any sit<>stands or while standing. Pt unable to tolerate standing long enough to wash buttocks requiring 2 sit<>stands to get this done. Educated on gaze fixation during sit<>stands with little improvement. Min A overalll for LB self-care. Pt handoff to PT for next therapy session.   Session 2 Pt greeted semi-reclined in bed, and needed encouragement from wife and OT to get OOB. Pt completed bed mobility with min A. Pt needed min A to don zip up sweat shirt at EOB, then OT lengthened batter pack for Life Vest to place cross body. Sit<>stand w/ RW and CGA. Pt ambulated in room CGA to get to the wc. Pt declined need to use the bathroom. Pt brought down to therapy gym for there-ex. Pt completed 3 minutes on [employer] arm bike on level 1  before reaching max fatigue. Pt reported feeling very out of shape, and that his heart felt different. Educated on pursed lip breathing techniques as well. Pt reported need to use the bathroom. OT pushed pt back to room for time management. Pt ambulated into bathroom w/ RW and CGA. Pt able to manage clothing and sit on commode. Pt with successful BM and was able to complete peri-care with set-up A. Pt then ambulated out of bathroom w. RW and CGA and recalled RW placement at the sink to wash hands. Pt reported he was extremely dizzy and sat back down at EOB. Pt returned to supine and nurse tech entered to complete bladder scan. Pt left semi-reclined in bed with bed alarm on, spouse present, and needs met.    Therapy Documentation Precautions:  Precautions Precautions: Fall Precaution Comments: cardiac monitor Restrictions Weight Bearing Restrictions: No Pain: Pt reports mild discomfort in chest, rest and repositioned for comfort  Therapy/Group: Individual Therapy  Valma Cava 10/31/2020, 12:32 PM

## 2020-10-31 NOTE — Progress Notes (Signed)
Patient due to be cathed at 6pm. He has refused and has not voided. He says he will allow scan but not cathed right now.

## 2020-10-31 NOTE — Progress Notes (Signed)
Patient would not let tech obtain vital signs. Nurse went in and spoke with patient, was able to get the vital signs. However, patient continued to refuse ortho vital signs. Patient resting in bed with call bell within reach.

## 2020-10-31 NOTE — Progress Notes (Signed)
Physical Therapy Session Note  Patient Details  Name: Timothy Moran MRN: [id number] Date of Birth: 1945-09-23  Today's Date: 10/31/2020 PT Individual Time: 1535-1600 PT Individual Time Calculation (min): 25 min   Short Term Goals: Week 1:  PT Short Term Goal 1 (Week 1): Pt will tolerate assessment of stairs PT Short Term Goal 2 (Week 1): Pt will tolerate assessment of car transfers PT Short Term Goal 3 (Week 1): Pt will ambulate 30ft w/RW and cga PT Short Term Goal 4 (Week 1): Pt will perform bed to/from wc w/RW and cga  Skilled Therapeutic Interventions/Progress Updates:    Pt received supine in bed with his wife present - pt requires slight encouragement to participate in therapy session due to fatigue. Supine>sitting L EOB with supervision - requires short seated rest break due to onset of dizziness during this transition, this is a symptom of pt's meningioma. Sit<>stands using RW CGA for safety during session - pt states his dizziness usually occurs with transfers. Short distance ~47ft ambulatory transfer EOB>w/c using RW with CGA for safety/steadying.  Transported to/from gym in w/c for time management and energy conservation. Stand pivot w/c>Nustep using RW, CGA for steadying. Performed B LE reciprocal movement patterns for strengthening using Nustep on level 5 resistance for 5 minutes totaling 265steps - pt reports feeling muscle fatigue in gastroc/soleus, quads, and glutes. Gait training ~22ft using RW with CGA for steadying - decreased gait speed with increased forward trunk flexion - upon turning to sit in chair pt again had onset of dizziness associated with his meningioma. Transported back to room and pt requesting to return to bed. Short distance ~75ft ambulatory transfer back to bed using RW with CGA. Pt left sitting EOB in the care of his wife with NT aware.  Therapy Documentation Precautions:  Precautions Precautions: Fall Precaution Comments: cardiac  monitor Restrictions Weight Bearing Restrictions: No  Pain:   No reports of pain throughout session.  Therapy/Group: Individual Therapy  Tawana Scale , PT, DPT, CSRS  10/31/2020, 3:17 PM

## 2020-10-31 NOTE — Progress Notes (Signed)
PROGRESS NOTE   Subjective/Complaints: Disturbed by hospital beeping and wants to go home, but also wants to maximize his time here. He and his wife live in a 1-floor home and they have installed walk-in shower. He has no other complaints  ROS: Denies pain  Objective:   No results found. Recent Labs    10/30/20 0504  WBC 8.4  HGB 9.0*  HCT 27.3*  PLT 333   Recent Labs    10/30/20 0504  NA 139  K 3.9  CL 105  CO2 22  GLUCOSE 117*  BUN 28*  CREATININE 2.83*  CALCIUM 8.5*    Intake/Output Summary (Last 24 hours) at 10/31/2020 1811 Last data filed at 10/31/2020 0636 Gross per 24 hour  Intake --  Output 850 ml  Net -850 ml        Physical Exam: Vital Signs Blood pressure (!) 121/58, pulse 70, temperature 98.4 F (36.9 C), resp. rate 17, height 6\' 1"  (1.854 m), weight 98.4 kg, SpO2 94 %. Gen: no distress, normal appearing HEENT: oral mucosa pink and moist, NCAT Cardio: Reg rate Pulmonary:  Comments: CTA B/L- no W/R/R- good air movement Abdominal:  Comments: Soft, NT, ND, (+)BS -hypoactive BS Genitourinary: Comments: Foley in place- draining light amber urine Musculoskeletal:  Cervical back: Normal range of motionand neck supple.  Comments: UEs- 5-/5 in Biceps, triceps, grip and finger abd B/L LEs- 5-/5 in HF, KE, DF and PF B/L Skin: General: Skin is warmand dry.  Comments: L forearm IV- looks OK No skin breakdown on heels or backside Neurological:  Mental Status: He is oriented to person, place, and time.  Comments: Speech clear. Able to follow basic commands without difficulty. Able to recall state but had difficulty recalling city (moved from [place] 2 years ago).   Appropriate, however poor memory- couldn't recall when had LBM, if had foley or condom catheter, much info about current hospitalization.  Was Ox2- not to time Rapid alternating movements decreased/slowed  B/L Psychiatric:    Assessment/Plan: 1. Functional deficits which require 3+ hours per day of interdisciplinary therapy in a comprehensive inpatient rehab setting.  Physiatrist is providing close team supervision and 24 hour management of active medical problems listed below.  Physiatrist and rehab team continue to assess barriers to discharge/monitor patient progress toward functional and medical goals  Care Tool:  Bathing  Bathing activity did not occur: Refused Body parts bathed by patient: Right arm,Left arm,Right upper leg,Left upper leg,Face,Chest,Abdomen   Body parts bathed by helper: Front perineal area,Buttocks,Left lower leg,Right lower leg     Bathing assist Assist Level: Minimal Assistance - Patient > 75%     Upper Body Dressing/Undressing Upper body dressing   What is the patient wearing?: Pull over shirt    Upper body assist Assist Level: Minimal Assistance - Patient > 75%    Lower Body Dressing/Undressing Lower body dressing      What is the patient wearing?: Pants,Underwear/pull up     Lower body assist Assist for lower body dressing: Minimal Assistance - Patient > 75%     Toileting Toileting Toileting Activity did not occur (Clothing management and hygiene only): Refused  Toileting assist Assist for toileting:  Moderate Assistance - Patient 50 - 74%     Transfers Chair/bed transfer  Transfers assist     Chair/bed transfer assist level: Contact Guard/Touching assist Chair/bed transfer assistive device: Walker,Armrests   Locomotion Ambulation   Ambulation assist      Assist level: Contact Guard/Touching assist Assistive device: Walker-rolling Max distance: 22ft   Walk 10 feet activity   Assist     Assist level: Contact Guard/Touching assist Assistive device: Walker-rolling   Walk 50 feet activity   Assist Walk 50 feet with 2 turns activity did not occur: Safety/medical concerns (limited by anxiety)         Walk 150  feet activity   Assist Walk 150 feet activity did not occur: Safety/medical concerns         Walk 10 feet on uneven surface  activity   Assist Walk 10 feet on uneven surfaces activity did not occur: Safety/medical concerns         Wheelchair     Assist Will patient use wheelchair at discharge?: No             Wheelchair 50 feet with 2 turns activity    Assist            Wheelchair 150 feet activity     Assist          Blood pressure (!) 121/58, pulse 70, temperature 98.4 F (36.9 C), resp. rate 17, height 6\' 1"  (1.854 m), weight 98.4 kg, SpO2 94 %.  Medical Problem List and Plan: 1.Anoxic brain injurysecondary to VF cardiac arrest with EF of 20-25% requiring Lifevest -patient may Shower if can be done- have to deal with lifevest issue- cannot get wet -ELOS/Goals: 10-14 days- mod I to supervision- might be Supervision to min A for SLP  -Initial CIR evaluations today.  2. Antithrombotics: -DVT/anticoagulation:Pharmaceutical:Continue Lovenox--added 02/17 due to immobility and prior hx of PE(hx of PE ~ 10 years ago per wife). -antiplatelet therapy: ASA/Brillinta 3. Pain Management:Continue Oxycodone prn for chest wall pain. 4. Mood:LCSW to follow for evaluation and support. -antipsychotic agents: N/a 5. Neuropsych: This patientis notcapable of making decisions on hisown behalf. 6. Skin/Wound Care:Routine pressure relief measures. 7. Fluids/Electrolytes/Nutrition:Monitor I/O. Check Lytes in am.  --Add Ensure max to help with protein stores--->albumin 2.4.  8. CAD s/p PCI/stent: On ASA/Ticagrelor Lipitor, Isordil, hydralazine and metoprolol. --monitor for symptoms with increase in activity.  9. VT arrest: Monitor HR tid --LifeVest to be placed on 02/17  -HR well controlled- continue metoprolol QD 10. T2DM--new diagnosis: Hgb A1c-7. BS  relatively controlled.  --continue to monitor BS ac/hs and use SSI as indicated.  --not a candidate for metformin with CKD.  --May be able to trial amaryl again if intake improves.  11.BPH/Urinary retention/hematuria: Foley replaced on 02/17--will order UA/UCS for work up --Remove foley in am and start bladder training. Has seen Dr. Matilde Sprang in the past --continue Flomax. 12. Acoustic neuromaand other menigioma- loss of hearing on R? side: Chronic dizziness/HA for months per records review. --followed by Dr. Dema Severin at [facility] (was scheduled for Gamma knife radiosurgery) 13. Acute on chronic renal failureStageIIIa: Avoid nephrotoxic medications. SCr was 1.38 at admission and has improved from the peak of 8.02-->>3.01 --Monitor voiding with PVR checks once foley d/c- needs foley removal when allowed --Continue bicarb for metabolic acidosis.  14. Anemia of chronic disease: Monitor H/H for trends ---11.2-->9.0 --low iron/TIBC with elevated Ferritin--> --received Nulecit X 3 doses and Aranesp SQ 02/09.   LOS: 2 days A FACE TO  FACE EVALUATION WAS PERFORMED  Izora Ribas 10/31/2020, 6:11 PM

## 2020-10-31 NOTE — Progress Notes (Addendum)
Patient taken to restroom by staff. Patient did not void. This nurse offered in and out cath since patient has not voided. Patient and wife both refused in and out cathing at this time. MD Dr. Ranell Patrick made aware.

## 2020-11-01 LAB — GLUCOSE, CAPILLARY
Glucose-Capillary: 97 mg/dL (ref 70–99)
Glucose-Capillary: 164 mg/dL — ABNORMAL HIGH (ref 70–99)
Glucose-Capillary: 119 mg/dL — ABNORMAL HIGH (ref 70–99)
Glucose-Capillary: 129 mg/dL — ABNORMAL HIGH (ref 70–99)

## 2020-11-01 MED ORDER — TAMSULOSIN HCL 0.4 MG PO CAPS
0.8000 mg | ORAL_CAPSULE | Freq: Every day | ORAL | Status: DC
  Administered 2020-11-01 – 2020-11-05 (×5): 0.8 mg via ORAL
  Filled 2020-11-01 (×6): qty 2

## 2020-11-01 NOTE — Progress Notes (Signed)
PROGRESS NOTE   Subjective/Complaints: Refusing urinary catheterization Wants to go home  Able to ambulated around room with device Has stairs to enter home and has not attempted stairs yet, encouraged him to work on this today  ROS: Denies pain, +urinary retention  Objective:   No results found. Recent Labs    10/30/20 0504  WBC 8.4  HGB 9.0*  HCT 27.3*  PLT 333   Recent Labs    10/30/20 0504  NA 139  K 3.9  CL 105  CO2 22  GLUCOSE 117*  BUN 28*  CREATININE 2.83*  CALCIUM 8.5*    Intake/Output Summary (Last 24 hours) at 11/01/2020 0920 Last data filed at 11/01/2020 0830 Gross per 24 hour  Intake 600 ml  Output 1800 ml  Net -1200 ml        Physical Exam: Vital Signs Blood pressure 117/71, pulse (!) 59, temperature 98.6 F (37 C), resp. rate 18, height 6\' 1"  (1.854 m), weight 98.4 kg, SpO2 94 %.  Gen: no distress, normal appearing HEENT: oral mucosa pink and moist, NCAT Cardio: Reg rate Chest: normal effort, normal rate of breathing Abd: soft, non-distended Ext: no edema Psych: pleasant, normal affect Skin: intact Musculoskeletal:  Cervical back: Normal range of motionand neck supple.  Comments: UEs- 5-/5 in Biceps, triceps, grip and finger abd B/L LEs- 5-/5 in HF, KE, DF and PF B/L Skin: General: Skin is warmand dry.  Comments: L forearm IV- looks OK No skin breakdown on heels or backside Neurological:  Mental Status: He is oriented to person, place, and time.  Comments: Speech clear. Able to follow basic commands without difficulty. Able to recall state but had difficulty recalling city (moved from [place] 2 years ago).   Appropriate, however poor memory- couldn't recall when had LBM, if had foley or condom catheter, much info about current hospitalization.  Was Ox2- not to time Rapid alternating movements decreased/slowed B/L   Assessment/Plan: 1. Functional deficits  which require 3+ hours per day of interdisciplinary therapy in a comprehensive inpatient rehab setting.  Physiatrist is providing close team supervision and 24 hour management of active medical problems listed below.  Physiatrist and rehab team continue to assess barriers to discharge/monitor patient progress toward functional and medical goals  Care Tool:  Bathing  Bathing activity did not occur: Refused Body parts bathed by patient: Right arm,Left arm,Right upper leg,Left upper leg,Face,Chest,Abdomen   Body parts bathed by helper: Front perineal area,Buttocks,Left lower leg,Right lower leg     Bathing assist Assist Level: Minimal Assistance - Patient > 75%     Upper Body Dressing/Undressing Upper body dressing   What is the patient wearing?: Pull over shirt    Upper body assist Assist Level: Minimal Assistance - Patient > 75%    Lower Body Dressing/Undressing Lower body dressing      What is the patient wearing?: Pants,Underwear/pull up     Lower body assist Assist for lower body dressing: Minimal Assistance - Patient > 75%     Toileting Toileting Toileting Activity did not occur (Clothing management and hygiene only): Refused  Toileting assist Assist for toileting: Moderate Assistance - Patient 50 - 74%  Transfers Chair/bed transfer  Transfers assist     Chair/bed transfer assist level: Contact Guard/Touching assist Chair/bed transfer assistive device: Walker,Armrests   Locomotion Ambulation   Ambulation assist      Assist level: Contact Guard/Touching assist Assistive device: Walker-rolling Max distance: 69ft   Walk 10 feet activity   Assist     Assist level: Contact Guard/Touching assist Assistive device: Walker-rolling   Walk 50 feet activity   Assist Walk 50 feet with 2 turns activity did not occur: Safety/medical concerns (limited by anxiety)         Walk 150 feet activity   Assist Walk 150 feet activity did not occur:  Safety/medical concerns         Walk 10 feet on uneven surface  activity   Assist Walk 10 feet on uneven surfaces activity did not occur: Safety/medical concerns         Wheelchair     Assist Will patient use wheelchair at discharge?: No             Wheelchair 50 feet with 2 turns activity    Assist            Wheelchair 150 feet activity     Assist          Blood pressure 117/71, pulse (!) 59, temperature 98.6 F (37 C), resp. rate 18, height 6\' 1"  (1.854 m), weight 98.4 kg, SpO2 94 %.  Medical Problem List and Plan: 1.Anoxic brain injurysecondary to VF cardiac arrest with EF of 20-25% requiring Lifevest -patient may Shower if can be done- have to deal with lifevest issue- cannot get wet -ELOS/Goals: 10-14 days- mod I to supervision- might be Supervision to min A for SLP  -Continue CIR  2. Antithrombotics: -DVT/anticoagulation:Pharmaceutical:Continue Lovenox--added 02/17 due to immobility and prior hx of PE(hx of PE ~ 10 years ago per wife). -antiplatelet therapy: ASA/Brillinta 3. Pain Management:Continue Oxycodone prn for chest wall pain. 4. Mood:LCSW to follow for evaluation and support. -antipsychotic agents: N/a 5. Neuropsych: This patientis notcapable of making decisions on hisown behalf. 6. Skin/Wound Care:Routine pressure relief measures. 7. Fluids/Electrolytes/Nutrition:Monitor I/O. Check Lytes in am.  --Add Ensure max to help with protein stores--->albumin 2.4.  8. CAD s/p PCI/stent: On ASA/Ticagrelor Lipitor, Isordil, hydralazine and metoprolol. --monitor for symptoms with increase in activity.  9. VT arrest: Monitor HR tid --LifeVest to be placed on 02/17  -HR well controlled- continue metoprolol QD 10. T2DM--new diagnosis: Hgb A1c-7. BS relatively controlled.  --continue to monitor BS ac/hs and use SSI as indicated.   --not a candidate for metformin with CKD.  --May be able to trial amaryl again if intake improves.  11.BPH/Urinary retention/hematuria: Foley replaced on 02/17--will order UA/UCS for work up --Has seen Dr. Matilde Sprang in the past --continue Flomax.  Not voiding, refusing cath, continue bladder scans, increase flomax to 0.8mg - monitor BP since diastolic already soft 12. Acoustic neuromaand other menigioma- loss of hearing on R? side: Chronic dizziness/HA for months per records review. --followed by Dr. Dema Severin at [facility] (was scheduled for Gamma knife radiosurgery) 13. Acute on chronic renal failureStageIIIa: Avoid nephrotoxic medications. SCr was 1.38 at admission and has improved from the peak of 8.02-->>3.01 --Monitor voiding with PVR checks  -Refusing catheterization but not voiding.  --Continue bicarb for metabolic acidosis.  14. Anemia of chronic disease: Monitor H/H for trends ---11.2-->9.0 --low iron/TIBC with elevated Ferritin--> --received Nulecit X 3 doses and Aranesp SQ 02/09 15. Poor sleep: does not like current bed- messaged nursing to see  if there are any other mattress options we can offer him  16. Disposition: prefers to go home sooner. Encouraged trial of stairs today as he will have to navigate these at home  LOS: 3 days A FACE TO FACE EVALUATION WAS [place] 11/01/2020, 9:20 AM

## 2020-11-01 NOTE — Progress Notes (Signed)
Physical Therapy Session Note  Patient Details  Name: Timothy Moran MRN: [id number] Date of Birth: [birth year]  Today's Date: 11/01/2020 PT Individual Time: 0900-1000 PT Individual Time Calculation (min): 60 min   Short Term Goals: Week 1:  PT Short Term Goal 1 (Week 1): Pt will tolerate assessment of stairs PT Short Term Goal 2 (Week 1): Pt will tolerate assessment of car transfers PT Short Term Goal 3 (Week 1): Pt will ambulate 34ft w/RW and cga PT Short Term Goal 4 (Week 1): Pt will perform bed to/from wc w/RW and cga  Skilled Therapeutic Interventions/Progress Updates:  Pt resting in bed.  He denied pain.    Supine> sit with HOB raised, quickly, with resulting dizziness, supervision.  PT educated pt and wife on slowing this movement down; he repeated with rolling L , then bil LEs off of bed, then pushing up , with less dizziness.  Sit> stand with CGA, to RW.  After standing x 10 seconds, dizziness increased and pt became anxious.  He sat with CGA.  2nd trial, slower, without increased dizziness.  Gait with RW x 10' including turns, CGA without increased dizziness.  BP 112/92; HR 91, O2 sats 98%.  For activity tolerance, use of Kinetron from wc level, at resistance 60 cm/sec, x 25 cylcles, x 30 cycles.  HR 72, O2 sats 95%; dyspneic.   PT educated pt on relaxation imagery for anxiety and dyspnea, with fair carryover.  Pt needs cues throughout to slow down movements to prevent dizziness  Sit> stand at steps with increased dizziness, requiring pt to sit down.  When standing 2nd trial very slowly, no increased dizziness.  Gait up/down 4 4" high steps, 2 rails x 1, 1 rail R x 1  as per home entrance.  Gait x 25' on level tile with RW, CG to recliner in room.  At end of session, pt resting in recliner with seat pad alarm set, cushion from wc in recliner and all needs within reach.  Wife present. Pt and wife very pleased with his progress.     Therapy Documentation Precautions:   Precautions Precautions: Fall Precaution Comments: cardiac monitor Restrictions Weight Bearing Restrictions: No        Therapy/Group: Individual Therapy  Adriene Knipfer 11/01/2020, 10:16 AM

## 2020-11-02 LAB — CBC
HCT: 27.2 % — ABNORMAL LOW (ref 39.0–52.0)
Hemoglobin: 9 g/dL — ABNORMAL LOW (ref 13.0–17.0)
MCH: 31.1 pg (ref 26.0–34.0)
MCHC: 33.1 g/dL (ref 30.0–36.0)
MCV: 94.1 fL (ref 80.0–100.0)
Platelets: 284 10*3/uL (ref 150–400)
RBC: 2.89 MIL/uL — ABNORMAL LOW (ref 4.22–5.81)
RDW: 14.6 % (ref 11.5–15.5)
WBC: 6.6 10*3/uL (ref 4.0–10.5)
nRBC: 0 % (ref 0.0–0.2)

## 2020-11-02 LAB — GLUCOSE, CAPILLARY
Glucose-Capillary: 93 mg/dL (ref 70–99)
Glucose-Capillary: 121 mg/dL — ABNORMAL HIGH (ref 70–99)
Glucose-Capillary: 103 mg/dL — ABNORMAL HIGH (ref 70–99)
Glucose-Capillary: 123 mg/dL — ABNORMAL HIGH (ref 70–99)

## 2020-11-02 LAB — BASIC METABOLIC PANEL
Anion gap: 12 (ref 5–15)
BUN: 28 mg/dL — ABNORMAL HIGH (ref 8–23)
CO2: 20 mmol/L — ABNORMAL LOW (ref 22–32)
Calcium: 8.4 mg/dL — ABNORMAL LOW (ref 8.9–10.3)
Chloride: 106 mmol/L (ref 98–111)
Creatinine, Ser: 2.74 mg/dL — ABNORMAL HIGH (ref 0.61–1.24)
GFR, Estimated: 23 mL/min — ABNORMAL LOW (ref >60)
Glucose, Bld: 103 mg/dL — ABNORMAL HIGH (ref 70–99)
Potassium: 3.6 mmol/L (ref 3.5–5.1)
Sodium: 138 mmol/L (ref 135–145)

## 2020-11-02 MED ORDER — MELATONIN 5 MG PO TABS
5.0000 mg | ORAL_TABLET | Freq: Every day | ORAL | Status: DC
  Administered 2020-11-02 – 2020-11-05 (×4): 5 mg via ORAL
  Filled 2020-11-02 (×4): qty 1

## 2020-11-02 MED ORDER — MECLIZINE HCL 25 MG PO TABS: 25.0000 mg | ORAL_TABLET | Freq: Three times a day (TID) | ORAL | Status: DC | PRN

## 2020-11-02 NOTE — Progress Notes (Signed)
Occupational Therapy Session Note  Patient Details  Name: Timothy Moran MRN: [id number] Date of Birth: 1946-04-06  Today's Date: 11/02/2020 OT Individual Time: 7654-6503 OT Individual Time Calculation (min): 57 min    Short Term Goals: Week 1:  OT Short Term Goal 1 (Week 1): pt will transfer to toilet wiht CGA consistently OT Short Term Goal 2 (Week 1): pt will stand for clothing managment wiht no symptoms of dizziness OT Short Term Goal 3 (Week 1): Pt will use breathing techniques to manage anxiety PRN with no more than min cues OT Short Term Goal 4 (Week 1): Pt will don shirt wiht set up  Skilled Therapeutic Interventions/Progress Updates:    Pt greeted semi-reclined in bed and reported need to go to the bathroom. Pt completed bed mobility with CGA. Sit<>stand w/ RW and ambulation into bathroom with min cues to make sure he had life vest battery pack and CGA. Pt with successful BM but unable to void. Pt able to complete peri-care with supervision. Standing balance at the sink to wash hands, then reported dizziness and need to sit. Pt ate breakfast while OT discussed home set-up and DME needs with pt and spouse. Pt then brought down to therapy gym and worked on standing balance/endurance with standing card matching activity. Had pt focus gaze with sit<>stands with some improvement with dizziness. Pt completed obstacle course with cone weaves and stepping over objects. Pt with increased dizzines with turns despite OT guiding pt through gaze focus. Pt completed 5 sit<>stands from wc while trying to maintain gaze on sticker on wall. OT provided pt with RW bag to give pt options of battery pack storage. Pt returned to room and ambulated to the recliner with RW and close supervision. Pt left seated in recliner with chair alarm on and spouse present,.    Therapy Documentation Precautions:  Precautions Precautions: Fall Precaution Comments: cardiac monitor Restrictions Weight Bearing  Restrictions: No Pain:   denies pain  Therapy/Group: Individual Therapy  Valma Cava 11/02/2020, 8:39 AM

## 2020-11-02 NOTE — Progress Notes (Signed)
Physical Therapy Session Note  Patient Details  Name: Timothy Moran MRN: [id number] Date of Birth: 07/30/1946  Today's Date: 11/02/2020 PT Individual Time: 0905-1009 PT Individual Time Calculation (min): 64 min   Short Term Goals: Week 1:  PT Short Term Goal 1 (Week 1): Pt will tolerate assessment of stairs PT Short Term Goal 2 (Week 1): Pt will tolerate assessment of car transfers PT Short Term Goal 3 (Week 1): Pt will ambulate 47ft w/RW and cga PT Short Term Goal 4 (Week 1): Pt will perform bed to/from wc w/RW and cga  Skilled Therapeutic Interventions/Progress Updates:     Pt received seated in recliner and requesting to go back to bed, but agreeable to therapy with gentle encouragement. No complaint of pain. Pt performs sit to stand from recliner with supervision and cues for upright gaze to improve posture and balance. Pt almost immediately begins complaining of dizziness and attempting to step away form chair. PT provides minA for stability and educates pt on remaining stationary for safety. Pt sits back down and complains of intense dizziness, also presenting with tachypnea. Dizziness resolves fairly quickly in sitting. Pt stands back up and again almost immdiately experiences dizziness symptoms, this time complaining of face becoming "hot". Following rest break, PT cues pt to quickly perform stand pivot transfer to [facility] to limit symptoms from standing. Pt transfers with CGA from recliner>WC>Nustep. Nustep performed for strength and endurance training as well as reciprocal coordination. Pt coimpletes at workload of 6 with SPM >50, taking rest breaks every 3:00. Following 2nd rest break, pt begins to breath rapidly and verbalizes feeling very SOB, appearing "panicky". Vitals taken with pt's O2 sats 100% on room air and HR 80 BPM. PT cues pt for pursed lip breathing to calm anxiety symptoms and provides pt with multiple external cues to alter symptoms, including distraction, change of  environment, and guided imagery. Pt demos improved symptoms while being distracted but quickly returns to tachypneic breathing pattern when not cued. Pt handed off to OT at end of session.  Therapy Documentation Precautions:  Precautions Precautions: Fall Precaution Comments: cardiac monitor Restrictions Weight Bearing Restrictions: No    Therapy/Group: Individual Therapy  Breck Coons, PT, DPT 11/02/2020, 9:35 AM

## 2020-11-02 NOTE — Progress Notes (Signed)
PROGRESS NOTE   Subjective/Complaints: Pt up in chair. Anxious to do some more therapy. Notes ongoing dizziness. Denies anxiety. Breathing ok. Eating better  ROS: Patient denies fever, rash, sore throat, blurred vision, nausea, vomiting, diarrhea, cough, shortness of breath or chest pain, joint or back pain, headache, or mood change.     Objective:   No results found. Recent Labs    11/02/20 0536  WBC 6.6  HGB 9.0*  HCT 27.2*  PLT 284   Recent Labs    11/02/20 0536  NA 138  K 3.6  CL 106  CO2 20*  GLUCOSE 103*  BUN 28*  CREATININE 2.74*  CALCIUM 8.4*    Intake/Output Summary (Last 24 hours) at 11/02/2020 1359 Last data filed at 11/02/2020 1311 Gross per 24 hour  Intake 680 ml  Output 900 ml  Net -220 ml        Physical Exam: Vital Signs Blood pressure 116/62, pulse 68, temperature 98.2 F (36.8 C), temperature source Oral, resp. rate 18, height 6\' 1"  (1.854 m), weight 98.4 kg, SpO2 94 %.  Constitutional: No distress . Vital signs reviewed. HEENT: EOMI, oral membranes moist Neck: supple Cardiovascular: RRR without murmur. No JVD    Respiratory/Chest: CTA Bilaterally without wheezes or rales. Normal effort    GI/Abdomen: BS +, non-tender, non-distended Ext: no clubbing, cyanosis, or edema Psych: pleasant and cooperative Skin: intact Musculoskeletal:  Cervical/ back: Normal range of motionand neck supple.  Neurological:  Mental Status: He is oriented to person, place, and time.  Comments: follows commands. Some STM deficits. Improving insight and awareness. Motor 4 to 4+/5 all 4's. No sensory findings  Assessment/Plan: 1. Functional deficits which require 3+ hours per day of interdisciplinary therapy in a comprehensive inpatient rehab setting.  Physiatrist is providing close team supervision and 24 hour management of active medical problems listed below.  Physiatrist and rehab team  continue to assess barriers to discharge/monitor patient progress toward functional and medical goals  Care Tool:  Bathing  Bathing activity did not occur: Refused Body parts bathed by patient: Right arm,Left arm,Right upper leg,Left upper leg,Face,Chest,Abdomen   Body parts bathed by helper: Front perineal area,Buttocks,Left lower leg,Right lower leg     Bathing assist Assist Level: Minimal Assistance - Patient > 75%     Upper Body Dressing/Undressing Upper body dressing   What is the patient wearing?: Pull over shirt    Upper body assist Assist Level: Minimal Assistance - Patient > 75%    Lower Body Dressing/Undressing Lower body dressing      What is the patient wearing?: Pants,Underwear/pull up     Lower body assist Assist for lower body dressing: Minimal Assistance - Patient > 75%     Toileting Toileting Toileting Activity did not occur (Clothing management and hygiene only): Refused  Toileting assist Assist for toileting: Moderate Assistance - Patient 50 - 74%     Transfers Chair/bed transfer  Transfers assist     Chair/bed transfer assist level: Contact Guard/Touching assist Chair/bed transfer assistive device: Programmer, multimedia   Ambulation assist      Assist level: Contact Guard/Touching assist Assistive device: Walker-rolling Max distance: 25   Walk  10 feet activity   Assist     Assist level: Contact Guard/Touching assist Assistive device: Walker-rolling   Walk 50 feet activity   Assist Walk 50 feet with 2 turns activity did not occur: Safety/medical concerns (limited by anxiety)         Walk 150 feet activity   Assist Walk 150 feet activity did not occur: Safety/medical concerns         Walk 10 feet on uneven surface  activity   Assist Walk 10 feet on uneven surfaces activity did not occur: Safety/medical concerns         Wheelchair     Assist Will patient use wheelchair at discharge?: No              Wheelchair 50 feet with 2 turns activity    Assist            Wheelchair 150 feet activity     Assist          Blood pressure 116/62, pulse 68, temperature 98.2 F (36.8 C), temperature source Oral, resp. rate 18, height 6\' 1"  (1.854 m), weight 98.4 kg, SpO2 94 %.  Medical Problem List and Plan: 1.Anoxic brain injurysecondary to VF cardiac arrest with EF of 20-25% requiring Lifevest -patient may remove life vest to shower -ELOS/Goals: 10-14 days- mod I to supervision- -Continue CIR    -pt is itching to get home 2. Antithrombotics: -DVT/anticoagulation:Pharmaceutical:Continue Lovenox--added 02/17 due to immobility and prior hx of PE(hx of PE ~ 10 years ago per wife). -antiplatelet therapy: ASA/Brillinta 3. Pain Management:Continue Oxycodone prn for chest wall pain. 4. Mood:LCSW to follow for evaluation and support. -antipsychotic agents: N/a  -hopefully will sleep better with a busy physical day ahead of him 5. Neuropsych: This patientis notcapable of making decisions on hisown behalf. 6. Skin/Wound Care:Routine pressure relief measures. 7. Fluids/Electrolytes/Nutrition:Monitor I/O. Check Lytes in am.  --Added Ensure max to help with protein stores--->albumin 2.4.  8. CAD s/p PCI/stent: On ASA/Ticagrelor Lipitor, Isordil, hydralazine and metoprolol. --monitor for symptoms with increase in activity.  9. VT arrest: Monitor HR tid --LifeVest to be placed on 02/17  -HR well controlled- continue metoprolol QD 10. T2DM--new diagnosis: Hgb A1c-7. BS relatively controlled.  --continue to monitor BS ac/hs and use SSI as indicated.  --consider amaryl trial if needed 11.BPH/Urinary retention/hematuria: Foley replaced on 02/17--will order UA/UCS for work up --Has seen Dr. Matilde Sprang in the past --continue  Flomax.  Still not emptying. flomax increased to 0.8mg   -may have to replace foley depending upon acceptance if I/O caths or being able to void on his own. 12. Acoustic neuromaand other menigioma- loss of hearing on R? side: Chronic dizziness/HA for months per records review. --followed by Dr. Dema Severin at [facility] (was scheduled for Gamma knife radiosurgery)  -add meclizine prn 13. Acute on chronic renal failureStageIIIa: Avoid nephrotoxic medications. SCr was 1.38 at admission and has improved to 2.74 today 2/21 --Monitor voiding with PVR checks  -Refusing catheterizations at times.  --Continue bicarb for metabolic acidosis.  14. Anemia of chronic disease: Monitor H/H for trends ---11.2-->9.0 --low iron/TIBC with elevated Ferritin--> --received Nulecit X 3 doses and Aranesp SQ 02/09      LOS: 4 days A FACE TO [place] 11/02/2020, 1:59 PM

## 2020-11-02 NOTE — Progress Notes (Signed)
Occupational Therapy Session Note  Patient Details  Name: Timothy Moran MRN: [id number] Date of Birth: 1946-08-15  Today's Date: 11/02/2020 OT Individual Time: 1010-1040 OT Individual Time Calculation (min): 30 min    Short Term Goals: Week 1:  OT Short Term Goal 1 (Week 1): pt will transfer to toilet wiht CGA consistently OT Short Term Goal 2 (Week 1): pt will stand for clothing managment wiht no symptoms of dizziness OT Short Term Goal 3 (Week 1): Pt will use breathing techniques to manage anxiety PRN with no more than min cues OT Short Term Goal 4 (Week 1): Pt will don shirt wiht set up  Skilled Therapeutic Interventions/Progress Updates:    Pt received from PT in hallway in w/c, reporting anxiety a limiting factor in their session. Also reporting all VSS and pt often begins hyperventilating d/t anxiety. Pt was taken via w/c to the therapy apt to provide a less sterile, more familiar environment to ease anxiety. Pt completed stand pivot transfer to the recliner with min A. He was able to relax seated, stating the chair was similar to his home one. He engaged in conversation re anxiety reducing strategies, including meditation and guided visualizations. Pt was returned to his room and he transferred back to bed with min A. Assisted him in downloading meditation app to his phone and discussing options/recommendations. Pt was left supine with all needs met, life vest on. Bed alarm set and wife present.   Therapy Documentation Precautions:  Precautions Precautions: Fall Precaution Comments: cardiac monitor Restrictions Weight Bearing Restrictions: No   Therapy/Group: Individual Therapy  Curtis Sites 11/02/2020, 6:29 AM

## 2020-11-02 NOTE — Progress Notes (Signed)
Speech Language Pathology Daily Session Note  Patient Details  Name: Timothy Moran MRN: [id number] Date of Birth: 06-23-1946  Today's Date: 11/02/2020 SLP Individual Time: 1045-1130 SLP Individual Time Calculation (min): 45 min  Short Term Goals: Week 1: SLP Short Term Goal 1 (Week 1): Pt will use external memory aids to facilitate recall of daily information with supervision verbal cues. SLP Short Term Goal 2 (Week 1): Pt will complete mildly complex functional tasks wtih supervision verbal cues for functional problem solving. SLP Short Term Goal 3 (Week 1): Pt will recognize and correct errors in the moment during functional tasks with supervision verbal cues. SLP Short Term Goal 4 (Week 1): Pt will sustain his attention to functional tasks for 15 minute intervals with supervision verbal cues for redirection.  Skilled Therapeutic Interventions: Pt seen for skilled ST with focus on cognitive goals, wife present throughout. Pt continues to state he wants to d/c home and thinks he will be fine there. Pt benefits from gentle education re: current medical impairments including significant dizziness reported recently and how this will impact safety at home. Education mildly effective, pt remains with impaired awareness of deficits and safety. Pt completing simple problem solving tasks benefiting from min A verbal cues for 85% accuracy. Pt demonstrating sustained attention averaging ~10 minutes at beginning of session, significantly decreased toward end with patient closing eyes during sentences resulting in decreased topic maintenance. SLP, pt and wife initiated medication management task to increase awareness of current meds/rationale behind taking them, pt would benefit from ongoing education and training. Pt left in bed with alarm set with wife at bedside, cont ST POC.  Pain Pain Assessment Pain Scale: 0-10 Pain Score: 0-No pain  Therapy/Group: Individual Therapy  [name] 11/02/2020, 11:42 AM

## 2020-11-02 NOTE — Care Management (Signed)
Inpatient [facility] Individual Statement of Services  Patient Name:  Wlliam Grosso  Date:  11/02/2020  Welcome to the [facility].  Our goal is to provide you with an individualized program based on your diagnosis and situation, designed to meet your specific needs.  With this comprehensive rehabilitation program, you will be expected to participate in at least 3 hours of rehabilitation therapies Monday-Friday, with modified therapy programming on the weekends.  Your rehabilitation program will include the following services:  Physical Therapy (PT), Occupational Therapy (OT), Speech Therapy (ST), 24 hour per day rehabilitation nursing, Therapeutic Recreaction (TR), Psychology, Neuropsychology, Care Coordinator, Rehabilitation Medicine, Nutrition Services, Pharmacy Services and Other  Weekly team conferences will be held on Tuesdays to discuss your progress.  Your Inpatient Rehabilitation Care Coordinator will talk with you frequently to get your input and to update you on team discussions.  Team conferences with you and your family in attendance may also be held.  Expected length of stay: 10-14 days    Overall anticipated outcome: Supervision  Depending on your progress and recovery, your program may change. Your Inpatient Rehabilitation Care Coordinator will coordinate services and will keep you informed of any changes. Your Inpatient Rehabilitation Care Coordinator's name and contact numbers are listed  below.  The following services may also be recommended but are not provided by the [facility] will be made to provide these services after discharge if needed.  Arrangements include referral to agencies that provide these services.  Your insurance has been verified to be:  Medicare A/B  Your  primary doctor is:  Cari Caraway  Pertinent information will be shared with your doctor and your insurance company.  Inpatient Rehabilitation Care Coordinator:  Cathleen Corti [phone] or [phone]  Information discussed with and copy given to patient by: Rana Snare, 11/02/2020, 1:18 PM

## 2020-11-03 LAB — GLUCOSE, CAPILLARY
Glucose-Capillary: 102 mg/dL — ABNORMAL HIGH (ref 70–99)
Glucose-Capillary: 152 mg/dL — ABNORMAL HIGH (ref 70–99)
Glucose-Capillary: 159 mg/dL — ABNORMAL HIGH (ref 70–99)
Glucose-Capillary: 135 mg/dL — ABNORMAL HIGH (ref 70–99)

## 2020-11-03 MED ORDER — SCOPOLAMINE 1 MG/3DAYS TD PT72
1.0000 | MEDICATED_PATCH | TRANSDERMAL | Status: DC
  Administered 2020-11-03 – 2020-11-06 (×2): 1.5 mg via TRANSDERMAL
  Filled 2020-11-03 (×2): qty 1

## 2020-11-03 MED ORDER — TRAZODONE HCL 50 MG PO TABS
50.0000 mg | ORAL_TABLET | Freq: Every day | ORAL | Status: DC
  Administered 2020-11-03: 50 mg via ORAL

## 2020-11-03 NOTE — Progress Notes (Signed)
Occupational Therapy Session Note  Patient Details  Name: Timothy Moran MRN: [id number] Date of Birth: 02-12-1946  Today's Date: 11/03/2020 OT Individual Time: 1432-1530 OT Individual Time Calculation (min): 58 min   Short Term Goals: Week 1:  OT Short Term Goal 1 (Week 1): pt will transfer to toilet wiht CGA consistently OT Short Term Goal 2 (Week 1): pt will stand for clothing managment wiht no symptoms of dizziness OT Short Term Goal 3 (Week 1): Pt will use breathing techniques to manage anxiety PRN with no more than min cues OT Short Term Goal 4 (Week 1): Pt will don shirt wiht set up  Skilled Therapeutic Interventions/Progress Updates:    Pt greeted semi-reclined in bed with spouse present and agreeable to shower today. OT obtained LifeVest patient guidebook and opened book to shower directions page. Talked pt's wife through directions for removing LifeVest to shower. OT placed sticky note to mark page for next time. Pt and spouse practiced taking in and out battery, as well as donning/doffing Life Vest. Pt ambulated throughout session with RW and close supervision-intermittent CGA when turning to get into shower. Bathing completed with set-up A and CGA when standing to wash buttocks. Dressing completed from wc with CGA for balance again 2.2 dizziness. Pt returned to bed at end of session with close supervision and left semi-reclined in bed with bed alarm on, call bell in reach, andn needs met.   Therapy Documentation Precautions:  Precautions Precautions: Fall Precaution Comments: cardiac monitor Restrictions Weight Bearing Restrictions: No Pain: Denies pain  Therapy/Group: Individual Therapy  Valma Cava 11/03/2020, 3:29 PM

## 2020-11-03 NOTE — Patient Care Conference (Signed)
Inpatient RehabilitationTeam Conference and Plan of Care Update Date: 11/03/2020   Time: 10:49 AM    Patient Name: Timothy Moran      Medical Record Number: [id number]  Date of Birth: 05-Jul-1946 Sex: Male         Room/Bed: 4W24C/4W24C-01 Payor Info: Payor: MEDICARE / Plan: MEDICARE PART A AND B / Product Type: *No Product type* /    Admit Date/Time:  10/29/2020  8:36 PM  Primary Diagnosis:  Anoxic brain injury [facility])  Hospital Problems: Principal Problem:   Anoxic brain injury [facility])    Expected Discharge Date: Expected Discharge Date: 11/06/20  Team Members Present: Physician leading conference: Dr. [name] Care Coodinator Present: Loralee Pacas, LCSWA;Madell Heino Creig Hines, RN, BSN, [place] Nurse Present: Mohammed Kindle, RN PT Present: Tereasa Coop, PT OT Present: Cherylynn Ridges, OT SLP Present: Weston Anna, SLP PPS Coordinator present : Gunnar Fusi, SLP     Current Status/Progress Goal Weekly Team Focus  Bowel/Bladder             Swallow/Nutrition/ Hydration             ADL's   Min/CGA  Supervision  activity tolerance, general strengthening, self-care retraining, pt/family ed, dc planning   Mobility             Communication             Safety/Cognition/ Behavioral Observations  Supervision  Mod I  complex problem solving, recall, attention   Pain             Skin               Discharge Planning:  D/c to home with 24/7 care from wife.   Team Discussion: Can take Lifevest off for showering. Doesn't want to be here, wife is supportive of therapy. Has urinary retention, may have to go home self-cathing. MD to add a Scopolamine patch for dizziness. Patient complained of indigestion earlier, Mylanta given. Continent bowel, I&O cath q 6 hr, no c/o pain. Plan is to discharge home with wife. Patient on target to meet rehab goals: Wants to do things for himself, has lots of dizziness. Teaching coping mechanisms. Had anxiety yesterday with PT, and remained  focused on that. Today was much better. SLP working on higher level of cognition but patient and wife feel he is at baseline.  *See Care Plan and progress notes for long and short-term goals.   Revisions to Treatment Plan:  Scopolamine patch ordered for dizziness, Trazodone ordered for sleep. Teaching Needs: Family education, medication management, skin/wound care, transfer training, gait training, safety awareness, I&O caths, coping mechanisms for anxiety and dizziness.  Current Barriers to Discharge: Inaccessible home environment, Decreased caregiver support, Home enviroment access/layout, Incontinence, Wound care, Lack of/limited family support, Medication compliance and Behavior  Possible Resolutions to Barriers: Continue current medications, teach I/O caths, provide emotional support to patient and family.     Medical Summary Current Status: anoxic BI after cardiac arrest. urine retention. anxiety/insomnia. acoustic neuroma with chronic dizziness  Barriers to Discharge: Medical stability   Possible Resolutions to [employer] Focus: teaching I/O caths. improve sleep, anxiety. rx dizziness   Continued Need for Acute Rehabilitation Level of Care: The patient requires daily medical management by a physician with specialized training in physical medicine and rehabilitation for the following reasons: Direction of a multidisciplinary physical rehabilitation program to maximize functional independence : Yes Medical management of patient stability for increased activity during participation in an intensive rehabilitation regime.: Yes Analysis of laboratory  values and/or radiology reports with any subsequent need for medication adjustment and/or medical intervention. : Yes   I attest that I was present, lead the team conference, and concur with the assessment and plan of the team.   Cristi Loron 11/03/2020, 4:03 PM

## 2020-11-03 NOTE — Progress Notes (Signed)
Speech Language Pathology Daily Session Note  Patient Details  Name: Timothy Moran MRN: [id number] Date of Birth: [birth year]  Today's Date: 11/03/2020 SLP Individual Time: 0800-0855 SLP Individual Time Calculation (min): 55 min  Short Term Goals: Week 1: SLP Short Term Goal 1 (Week 1): Pt will use external memory aids to facilitate recall of daily information with supervision verbal cues. SLP Short Term Goal 2 (Week 1): Pt will complete mildly complex functional tasks wtih supervision verbal cues for functional problem solving. SLP Short Term Goal 3 (Week 1): Pt will recognize and correct errors in the moment during functional tasks with supervision verbal cues. SLP Short Term Goal 4 (Week 1): Pt will sustain his attention to functional tasks for 15 minute intervals with supervision verbal cues for redirection.  Skilled Therapeutic Interventions: Skilled treatment session focused on cognitive goals. SLP facilitated session by providing Min A verbal cues to recall functions of current medications. Both the patient and his wife had multiple questions regarding medications, etc. Educated to direct question to physician. Both the patient and his wife report they feel the patient is at his cognitive baseline and that he is more impacted by fatigue right now. Educated on need to focus on higher level cognitive tasks to simulate real life experiences. Both verbalized understanding and agreement. Patient left upright in bed with alarm on and all needs within reach. Continue with current plan of care.      Pain No/Denies Pain   Therapy/Group: Individual Therapy  Nicol Herbig 11/03/2020, 10:26 AM

## 2020-11-03 NOTE — Progress Notes (Signed)
PROGRESS NOTE   Subjective/Complaints: Didn't sleep well last night. Melatonin doesn't help. Still feels dizzy when up. Denies significant pain. Still needing I/O caths  ROS: Patient denies fever, rash, sore throat, blurred vision, nausea, vomiting, diarrhea, cough, shortness of breath or chest pain, joint or back pain, headache, or mood change.    Objective:   No results found. Recent Labs    11/02/20 0536  WBC 6.6  HGB 9.0*  HCT 27.2*  PLT 284   Recent Labs    11/02/20 0536  NA 138  K 3.6  CL 106  CO2 20*  GLUCOSE 103*  BUN 28*  CREATININE 2.74*  CALCIUM 8.4*    Intake/Output Summary (Last 24 hours) at 11/03/2020 2426 Last data filed at 11/03/2020 0820 Gross per 24 hour  Intake 600 ml  Output 1540 ml  Net -940 ml        Physical Exam: Vital Signs Blood pressure 111/75, pulse 61, temperature 98.7 F (37.1 C), resp. rate 18, height 6\' 1"  (1.854 m), weight 98.4 kg, SpO2 96 %.  Constitutional: No distress . Vital signs reviewed. HEENT: EOMI, oral membranes moist Neck: supple Cardiovascular: RRR without murmur. No JVD    Respiratory/Chest: CTA Bilaterally without wheezes or rales. Normal effort    GI/Abdomen: BS +, non-tender, non-distended Ext: no clubbing, cyanosis, or edema Psych: generally pleasant and cooperative Skin: intact Musculoskeletal:  Cervical/ back: Normal range of motionand neck supple.  Neurological:  Mental Status: He is oriented to person, place, and time.  Comments: follows commands. Some STM deficits. Improving insight and awareness. Motor 4 to 4+/5 all 4's. No sensory findings  Assessment/Plan: 1. Functional deficits which require 3+ hours per day of interdisciplinary therapy in a comprehensive inpatient rehab setting.  Physiatrist is providing close team supervision and 24 hour management of active medical problems listed below.  Physiatrist and rehab team continue  to assess barriers to discharge/monitor patient progress toward functional and medical goals  Care Tool:  Bathing  Bathing activity did not occur: Refused Body parts bathed by patient: Right arm,Left arm,Right upper leg,Left upper leg,Face,Chest,Abdomen   Body parts bathed by helper: Front perineal area,Buttocks,Left lower leg,Right lower leg     Bathing assist Assist Level: Minimal Assistance - Patient > 75%     Upper Body Dressing/Undressing Upper body dressing   What is the patient wearing?: Pull over shirt    Upper body assist Assist Level: Minimal Assistance - Patient > 75%    Lower Body Dressing/Undressing Lower body dressing      What is the patient wearing?: Pants,Underwear/pull up     Lower body assist Assist for lower body dressing: Minimal Assistance - Patient > 75%     Toileting Toileting Toileting Activity did not occur (Clothing management and hygiene only): Refused  Toileting assist Assist for toileting: Moderate Assistance - Patient 50 - 74%     Transfers Chair/bed transfer  Transfers assist     Chair/bed transfer assist level: Contact Guard/Touching assist Chair/bed transfer assistive device: Programmer, multimedia   Ambulation assist      Assist level: Contact Guard/Touching assist Assistive device: Walker-rolling Max distance: 25   Walk 10 feet  activity   Assist     Assist level: Contact Guard/Touching assist Assistive device: Walker-rolling   Walk 50 feet activity   Assist Walk 50 feet with 2 turns activity did not occur: Safety/medical concerns (limited by anxiety)         Walk 150 feet activity   Assist Walk 150 feet activity did not occur: Safety/medical concerns         Walk 10 feet on uneven surface  activity   Assist Walk 10 feet on uneven surfaces activity did not occur: Safety/medical concerns         Wheelchair     Assist Will patient use wheelchair at discharge?: No              Wheelchair 50 feet with 2 turns activity    Assist            Wheelchair 150 feet activity     Assist          Blood pressure 111/75, pulse 61, temperature 98.7 F (37.1 C), resp. rate 18, height 6\' 1"  (1.854 m), weight 98.4 kg, SpO2 96 %.  Medical Problem List and Plan: 1.Anoxic brain injurysecondary to VF cardiac arrest with EF of 20-25% requiring Lifevest -patient may remove life vest to shower -ELOS/Goals: 10-14 days- mod I to supervision- -Continue CIR    -team conference today 2. Antithrombotics: -DVT/anticoagulation:Pharmaceutical:Continue Lovenox--added 02/17 due to immobility and prior hx of PE(hx of PE ~ 10 years ago per wife). -antiplatelet therapy: ASA/Brillinta 3. Pain Management:Continue Oxycodone prn for chest wall pain. 4. Mood/Sleep:LCSW to follow for evaluation and support. -antipsychotic agents: N/a  -continue melatonin at HS. Add scheduled trazodone 50mg  as well 5. Neuropsych: This patientis notcapable of making decisions on hisown behalf. 6. Skin/Wound Care:Routine pressure relief measures. 7. Fluids/Electrolytes/Nutrition:Monitor I/O. Check Lytes in am.  --Added Ensure max to help with protein stores--->albumin 2.4.  8. CAD s/p PCI/stent: On ASA/Ticagrelor Lipitor, Isordil, hydralazine and metoprolol. --monitor for symptoms with increase in activity.  9. VT arrest: Monitor HR tid --LifeVest to be placed on 02/17  -HR well controlled- continue metoprolol QD 10. T2DM--new diagnosis: Hgb A1c-7. BS controlled 2/22.  --continue to monitor BS ac/hs and use SSI as indicated.  --consider amaryl trial if needed 11.BPH/Urinary retention/hematuria: Foley replaced on 02/17--will order UA/UCS for work up --Has seen Dr. Matilde Sprang in the past --continue Flomax.  Still not emptying. flomax increased  to 0.8mg   -pt seems ok right now with I/O caths  -explained to him options are home with I/O caths or foley if he doesn't start emptying by discharge   -no cholinergics d/t CAD/CV 12. Acoustic neuromaand other menigioma- loss of hearing on R? side: Chronic dizziness/HA for months per records review. --followed by Dr. Dema Severin at [facility] (was scheduled for Gamma knife radiosurgery)  -  meclizine prn  -2/22 scopolamine patch trial 13. Acute on chronic renal failureStageIIIa: Avoid nephrotoxic medications. SCr was 1.38 at admission and has improved to 2.74  2/21 --Continue bicarb for metabolic acidosis.  14. Anemia of chronic disease: Monitor H/H for trends ---11.2-->9.0 --low iron/TIBC with elevated Ferritin--> --received Nulecit X 3 doses and Aranesp SQ 02/09      LOS: 5 days A FACE TO FACE EVALUATION WAS PERFORMED  Meredith Staggers 11/03/2020, 9:27 AM

## 2020-11-03 NOTE — Progress Notes (Signed)
Occupational Therapy Session Note  Patient Details  Name: Taylon Coole MRN: [id number] Date of Birth: 01/13/1946  Today's Date: 11/03/2020 OT Individual Time: 1400-1430 OT Individual Time Calculation (min): 30 min    Short Term Goals: Week 1:  OT Short Term Goal 1 (Week 1): pt will transfer to toilet wiht CGA consistently OT Short Term Goal 2 (Week 1): pt will stand for clothing managment wiht no symptoms of dizziness OT Short Term Goal 3 (Week 1): Pt will use breathing techniques to manage anxiety PRN with no more than min cues OT Short Term Goal 4 (Week 1): Pt will don shirt wiht set up  Skilled Therapeutic Interventions/Progress Updates:    Pt received supine with his wife present, agreeable to OT session. Pt completed bed mobility with CGA, min cueing for lifevest management. Pt completed sit > stand with cueing for reducing BUE support to stand. He required min cueing for technique and min A. Pt completed reciprocal stepping with isometric stance phase with BUE support on the RW to challenge dynamic standing balance with CGA overall. Pt x2 trials with extended rest break required between each. Pt left supine with all needs met.   Therapy Documentation Precautions:  Precautions Precautions: Fall Precaution Comments: cardiac monitor Restrictions Weight Bearing Restrictions: No Therapy/Group: Individual Therapy  Curtis Sites 11/03/2020, 6:26 AM

## 2020-11-03 NOTE — Progress Notes (Signed)
Physical Therapy Session Note  Patient Details  Name: Timothy Moran MRN: [id number] Date of Birth: 1945/12/10  Today's Date: 11/03/2020 PT Individual Time: 1030-1058 PT Individual Time Calculation (min): 28 min   Short Term Goals: Week 1:  PT Short Term Goal 1 (Week 1): Pt will tolerate assessment of stairs PT Short Term Goal 2 (Week 1): Pt will tolerate assessment of car transfers PT Short Term Goal 3 (Week 1): Pt will ambulate 61ft w/RW and cga PT Short Term Goal 4 (Week 1): Pt will perform bed to/from wc w/RW and cga  Skilled Therapeutic Interventions/Progress Updates:    Pt received sitting in w/c at the sink, finishing up brushing his teeth with his wife providing supervision. Pt reports no pain. W/c transport to day room gym for time management. Sit<>stand with CGA to RW from w/c and ambulated ~28ft with CGA and RW to mat table, able to stand>sit with CGA and RW. Performed lateral stepping, L<>R, 2 sets of 4x68ft with no AD and CGA/minA for steadying; cues for increasing step height, forward gaze, and paced breathing. Posterior bias noted and difficulty self correcting. Completed seated there-act with overhead reaching with soccer ball, 1x10 and seated ball toss with soccer ball, 1x15 in forward direction, 1x10 laterally (to R), and 1x10 laterally (to L). Stand<>pivot with CGA assist back to his w/c and returned to his room where he remained seated in w/c with wife at bedside and safety belt alarm on. Pt denied dizziness during session.  Therapy Documentation Precautions:  Precautions Precautions: Fall Precaution Comments: cardiac monitor Restrictions Weight Bearing Restrictions: No  Therapy/Group: Individual Therapy  Tressy Kunzman P Derisha Funderburke PT 11/03/2020, 7:42 AM

## 2020-11-03 NOTE — Progress Notes (Signed)
Physical Therapy Session Note  Patient Details  Name: Timothy Moran MRN: [id number] Date of Birth: 22-Jul-1946  Today's Date: 11/03/2020 PT Individual Time: 1007-1032 PT Individual Time Calculation (min): 25 min   Skilled Therapeutic Interventions/Progress Updates:     Pt received supine in bed and agrees to therapy. No complaint of pain. Supine to sit with bed rails and verbal cues on positioning at EOB. Pt performs stand step transfer to [facility] with CGA and no complaint of dizziness. WC transport to gym for time management. Pt performs block training for stairs on 6" steps. Pt completes 1x4 and 2x8 steps with R hand rail and CGA. Pt verbalizes fatigue in legs following 2nd bout but no complaint of dizziness. Extended rest breaks between bouts and PT provides cues for pursed lip breathing to optimize recovery. Pt instructed to perform final bout " to fatigue" and pt completes x4 steps. WC transport back to room. Pt left seated at sink in [facility] with alarm intact.  Therapy Documentation Precautions:  Precautions Precautions: Fall Precaution Comments: cardiac monitor Restrictions Weight Bearing Restrictions: No   Therapy/Group: Individual Therapy  Breck Coons, PT, DPT 11/03/2020, 4:33 PM

## 2020-11-03 NOTE — Progress Notes (Signed)
Patient ID: Timothy Moran, male   DOB: 04-12-1946, 75 y.o.   MRN: [id number]  SW met with pt and pt wife in room to provide updates from team conference, and d/c date 2/25. Pt concerned if he will d/c to home self-cathing. SW informed will know more by Thursday if he will d/c with catheters and/or foley. SW to follow-up once there are more updates on d/c recs.   Loralee Pacas, MSW, [place] Office: [phone] Cell: [phone] Fax: [phone]

## 2020-11-03 NOTE — Progress Notes (Signed)
Spoke with staff on unit.  Unit secretary will give AD to patient. Pt. Will have chaplain called if assistance is needed.  Jaclynn Major, [place], [facility], Pager [phone]

## 2020-11-04 LAB — GLUCOSE, CAPILLARY
Glucose-Capillary: 97 mg/dL (ref 70–99)
Glucose-Capillary: 126 mg/dL — ABNORMAL HIGH (ref 70–99)
Glucose-Capillary: 106 mg/dL — ABNORMAL HIGH (ref 70–99)
Glucose-Capillary: 108 mg/dL — ABNORMAL HIGH (ref 70–99)

## 2020-11-04 MED ORDER — OXYMETAZOLINE HCL 0.05 % NA SOLN
1.0000 | Freq: Two times a day (BID) | NASAL | Status: DC
  Administered 2020-11-04 – 2020-11-05 (×4): 1 via NASAL
  Filled 2020-11-04: qty 30

## 2020-11-04 MED ORDER — TRAZODONE HCL 50 MG PO TABS
50.0000 mg | ORAL_TABLET | Freq: Every day | ORAL | Status: DC
  Administered 2020-11-04 – 2020-11-05 (×2): 50 mg via ORAL
  Filled 2020-11-04 (×2): qty 1

## 2020-11-04 NOTE — Progress Notes (Signed)
Physical Therapy Session Note  Patient Details  Name: Timothy Moran MRN: [id number] Date of Birth: Aug 14, 1946  Today's Date: 11/04/2020 PT Individual Time: 1510-1540 PT Individual Time Calculation (min): 30 min   Short Term Goals: Week 1:  PT Short Term Goal 1 (Week 1): Pt will tolerate assessment of stairs PT Short Term Goal 2 (Week 1): Pt will tolerate assessment of car transfers PT Short Term Goal 3 (Week 1): Pt will ambulate 58ft w/RW and cga PT Short Term Goal 4 (Week 1): Pt will perform bed to/from wc w/RW and cga  Skilled Therapeutic Interventions/Progress Updates:     Patient in bed with life vest in place and his wife at bedside upon PT arrival. Patient alert and agreeable to PT session. Patient denied pain during session.  Focused session on vestibular evaluation due to history of "dizziness" with mobility. Patient with PMH of multiple meningiomas to which patient attributes his symptoms. Patient does take meclizine PRN at home for symptoms. Per patient's chart, patient did not receive meclizine today. Patient symptoms only provoked during sit-to-stand lasting >2 min in standing and sitting after. Describes symptoms as feeling like he is spinning or shaking. See vestibular eval below.   Vestibular Assessment - 11/04/20 0001      Symptom Behavior   Subjective history of current problem Onset of dizziness with dx of Meningiomas    Type of Dizziness  Imbalance;Unsteady with head/body turns;"Funny feeling in head"    Frequency of Dizziness Intermittent    Duration of Dizziness variable, 30 sec to several minutes    Symptom Nature Positional;Spontaneous;Variable;Intermittent    Aggravating Factors Turning body quickly;Sit to stand    Relieving Factors Head stationary;Medication;Slow movements    Progression of Symptoms No change since onset      Oculomotor Exam   Oculomotor Alignment Normal    Ocular ROM WFL    Spontaneous Absent    Gaze-induced  Age appropriate nystagmus  at end range    Head shaking Horizontal Absent    Head Shaking Vertical Absent    Smooth Pursuits Intact    Saccades Intact      Vestibulo-Ocular Reflex   VOR 1 Head Only (x 1 viewing) asymptomatic    VOR 2 Head and Object (x 2 viewing) asymptomatic      Positional Testing   Dix-Hallpike Dix-Hallpike Right;Dix-Hallpike Left      Dix-Hallpike Right   Dix-Hallpike Right Symptoms No nystagmus      Dix-Hallpike Left   Dix-Hallpike Left Symptoms No nystagmus      Cognition   Cognition Orientation Level Appropriate for developmental age      Positional Sensitivities   Sit to Supine No dizziness    Supine to Left Side No dizziness    Supine to Right Side No dizziness    Supine to Sitting No dizziness    Right Hallpike No dizziness    Up from Right Hallpike No dizziness    Up from Left Hallpike No dizziness    Head Turning x 5 No dizziness    Head Nodding x 5 No dizziness    Positional Sensitivities Comments sit to stand induced symptoms with visable imbalance and mild trembling           Noted minimal provocation of symptoms with testing. Patient with intermittent symptoms at baseline. Recommended habituation exercises with sit-to stand x5 3x per day when symptoms <5/10 on 0-10 dizziness scale and to follow-up with neurologist on symptom management.   Patient in bed with his  wife at bedside at end of session with breaks locked, bed alarm off per patient and his wife's request, RN in agreement, and all needs within reach.    Therapy Documentation Precautions:  Precautions Precautions: Fall Precaution Comments: cardiac monitor Restrictions Weight Bearing Restrictions: No   Therapy/Group: Individual Therapy  Corleen Otwell L Mir Fullilove PT, DPT  11/04/2020, 5:17 PM

## 2020-11-04 NOTE — Progress Notes (Addendum)
Patient ID: Timothy Moran, male   DOB: 1945/11/21, 75 y.o.   MRN: [id number]  SW met with pt and pt wife in room to discuss d/c recommendations: outpatient PT/OT and DME: 3in1 BSC and RW. Preferred outpatient location is [facility]. SW to order DME.  SW sent orders to [facility] via parachute.   SW faxed outpatient PT/OT referral to [facility] ([phone]/V:[phone]).  Loralee Pacas, MSW, [place] Office: [phone] Cell: [phone] Fax: [phone]

## 2020-11-04 NOTE — Evaluation (Signed)
Recreational Therapy Assessment and Plan  Patient Details  Name: Timothy Moran MRN: [id number] Date of Birth: Oct 31, 1945 Today's Date: 11/04/2020  Rehab Potential:  Good ELOS:   d/c 11/06/20  Assessment   Hospital Problem: Active Problems:   Anoxic brain injury [facility])   Past Medical History:      Past Medical History:  Diagnosis Date  . Abnormal echocardiogram 06/07/2019  . Acute on chronic combined systolic and diastolic HF (heart failure) ([place]) 10/26/2020  . Anoxic brain injury ([place]) 10/26/2020  . Arrhythmia   . Ascending aortic aneurysm (HCC) 4.2 cm based on CT from December 2020 01/29/2018   4.3 cm by echo in 2019  . Benign brain tumor ([place])   . Benign neoplasm of brain ([place]) 12/19/2017  . CAD in native artery residual disease of LAD, may need PCI, treating medically for now 10/26/2020  . Chest pain 12/04/2017  . Chronic kidney disease    Stage 3  . Chronic kidney disease, stage III (moderate) ([place]) 12/19/2017  . Diabetes mellitus, type 2 ([place]) 10/06/2020   10/06/20 A1C 7%  . Dizziness 10/11/2019  . Hematuria 10/26/2020  . History of pulmonary embolus (PE)   . Hypertension   . Iron deficiency anemia rec'd IV iron 10/26/2020  . Ischemic cardiomyopathy 10/26/2020  . Left bundle branch block 12/19/2017  . Nonsustained ventricular tachycardia ([place]) 10/11/2019  . Pain of left hip joint 05/29/2020  . Personal history of pulmonary embolism 12/19/2017  . Pulmonary hypertension due to thromboembolism ([place]) 03/09/2018   See echo 12/27/17 with nl PAS vs CTa chest 02/23/18   . S/P angioplasty with stent 10/04/20 DES to LCX  10/26/2020  . Sinus bradycardia 12/22/2017  . Solitary pulmonary nodule on lung CT 03/08/2018   CT 12/04/17 1.0 x 0.8 x 0.7 cm nodular opacity in the RUL vs not seen 03/16/10 posterior segment of the right upper lobe. Marland KitchenSpirometry 03/08/2018    FEV1 3.86 (108%)  Ratio 96 s prior rx  - PET  03/13/18   Low grade c/w adenoca > rec T surgery eval    . Urinary retention 10/26/2020    Past Surgical History:       Past Surgical History:  Procedure Laterality Date  . APPENDECTOMY    . CORONARY/GRAFT ACUTE MI REVASCULARIZATION N/A 10/04/2020   Procedure: Coronary/Graft Acute MI Revascularization;  Surgeon: Nelva Bush, MD;  Location: [place] [facility];  Service: Cardiovascular;  Laterality: N/A;  . HERNIA REPAIR    . LEFT HEART CATH AND CORONARY ANGIOGRAPHY N/A 10/04/2020   Procedure: LEFT HEART CATH AND CORONARY ANGIOGRAPHY;  Surgeon: Nelva Bush, MD;  Location: [facility];  Service: Cardiovascular;  Laterality: N/A;    Assessment & Plan Clinical Impression:Pt is 75 year old male with a history of LBBB, mildly reduced LVEF, 4.2cm TAA, multiple symptomatic meningiomas (possible NF2 suspected), CKD stage III, two prior pulmonary embolisms (now off Eliquis), HTN, and pulmonary nodules, who presented as a resuscitated VF arrest at home and was found to have inferior ST elevation on ECG. He began having chest pain while shoveling snow this afternoon. He collapsed and lost consciousness with no obvious injuries in the fall. No bystander CPR performed. He was found with agonal breathing and an AED-shockable rhythm. He received a total of 4 defibrillations by fire and EMS.  Patient is more alert, but remains confused with decreased awareness and safety.  Pt s/p cardiac cath with stent on 1/23 and was intubated until 1/25.  Pt transferred to CIR on  10/29/20.  Pt presents with decreased activity tolerance, decreased functional mobility, decreased balance, decreased problem solving, decreased safety awareness and decreased memory and expresses feelings of anxiety Limiting pt's independence with leisure/community pursuits.  Met with pt and wife at bedside to discuss leisure interests, activity analysis with potential modifications and coping strategies.  Discussion included importance of recognizing symptoms of anxiety as they are building up and early use of  strategies to manage these feelings, emphasis on diaphragmatic breathing.  Both stated understanding and are looking forward to discharge home tomorrow.  Plan  No further TR as pt is discharging home tomorrow.  Recommendations for other services: None   Discharge Criteria: Patient will be discharged from TR if patient refuses treatment 3 consecutive times without medical reason.  If treatment goals not met, if there is a change in medical status, if patient makes no progress towards goals or if patient is discharged from hospital.  The above assessment, treatment plan, treatment alternatives and goals were discussed and mutually agreed upon: by patient  [place] 11/04/2020, 8:36 AM

## 2020-11-04 NOTE — Progress Notes (Signed)
PROGRESS NOTE   Subjective/Complaints: Feels congested this morning. Had this problem at home and used to take Afrin. He has no other complaints this morning He felt that therapy went very well!  ROS: Patient denies fever, rash, sore throat, blurred vision, nausea, vomiting, diarrhea, cough, shortness of breath or chest pain, joint or back pain, headache, or mood change.    Objective:   No results found. Recent Labs    11/02/20 0536  WBC 6.6  HGB 9.0*  HCT 27.2*  PLT 284   Recent Labs    11/02/20 0536  NA 138  K 3.6  CL 106  CO2 20*  GLUCOSE 103*  BUN 28*  CREATININE 2.74*  CALCIUM 8.4*    Intake/Output Summary (Last 24 hours) at 11/04/2020 1121 Last data filed at 11/04/2020 0600 Gross per 24 hour  Intake 240 ml  Output 1500 ml  Net -1260 ml        Physical Exam: Vital Signs Blood pressure 122/77, pulse 67, temperature 98 F (36.7 C), temperature source Oral, resp. rate 17, height 6\' 1"  (1.854 m), weight 98.1 kg, SpO2 94 %.  Gen: no distress, normal appearing HEENT: oral mucosa pink and moist, NCAT Cardio: Reg rate Chest: normal effort, normal rate of breathing Abd: soft, non-distended Ext: no edema Psych: pleasant, normal affect Skin: intact Musculoskeletal:     Cervical/ back: Normal range of motion and neck supple.    Neurological:     Mental Status: He is oriented to person, place, and time.     Comments: follows commands. Some STM deficits. Improving insight and awareness. Motor 4 to 4+/5 all 4's. No sensory findings  Assessment/Plan: 1. Functional deficits which require 3+ hours per day of interdisciplinary therapy in a comprehensive inpatient rehab setting.  Physiatrist is providing close team supervision and 24 hour management of active medical problems listed below.  Physiatrist and rehab team continue to assess barriers to discharge/monitor patient progress toward functional and  medical goals  Care Tool:  Bathing  Bathing activity did not occur: Refused Body parts bathed by patient: Right arm,Left arm,Right upper leg,Left upper leg,Face,Chest,Abdomen   Body parts bathed by helper: Front perineal area,Buttocks,Left lower leg,Right lower leg     Bathing assist Assist Level: Minimal Assistance - Patient > 75%     Upper Body Dressing/Undressing Upper body dressing   What is the patient wearing?: Pull over shirt    Upper body assist Assist Level: Minimal Assistance - Patient > 75%    Lower Body Dressing/Undressing Lower body dressing      What is the patient wearing?: Pants,Underwear/pull up     Lower body assist Assist for lower body dressing: Minimal Assistance - Patient > 75%     Toileting Toileting Toileting Activity did not occur (Clothing management and hygiene only): Refused  Toileting assist Assist for toileting: Moderate Assistance - Patient 50 - 74%     Transfers Chair/bed transfer  Transfers assist     Chair/bed transfer assist level: Contact Guard/Touching assist Chair/bed transfer assistive device: Programmer, multimedia   Ambulation assist      Assist level: Contact Guard/Touching assist Assistive device: Walker-rolling Max distance: 25  Walk 10 feet activity   Assist     Assist level: Contact Guard/Touching assist Assistive device: Walker-rolling   Walk 50 feet activity   Assist Walk 50 feet with 2 turns activity did not occur: Safety/medical concerns (limited by anxiety)         Walk 150 feet activity   Assist Walk 150 feet activity did not occur: Safety/medical concerns         Walk 10 feet on uneven surface  activity   Assist Walk 10 feet on uneven surfaces activity did not occur: Safety/medical concerns         Wheelchair     Assist Will patient use wheelchair at discharge?: No             Wheelchair 50 feet with 2 turns activity    Assist             Wheelchair 150 feet activity     Assist          Blood pressure 122/77, pulse 67, temperature 98 F (36.7 C), temperature source Oral, resp. rate 17, height 6\' 1"  (1.854 m), weight 98.1 kg, SpO2 94 %.  Medical Problem List and Plan: 1.  Anoxic brain injury secondary to VF cardiac arrest with EF of 20-25% requiring Lifevest             -patient may remove life vest to shower             -ELOS/Goals: 10-14 days- mod I to supervision            Continue CIR  2.  Antithrombotics: -DVT/anticoagulation:  Pharmaceutical: Continue Lovenox--added 02/17 due to immobility and prior hx of PE (hx of PE ~ 10 years ago per wife).              -antiplatelet therapy: ASA/Brillinta  3. Pain Management: Continue Oxycodone prn for chest wall pain.  4. Mood/Sleep: LCSW to follow for evaluation and support.              -antipsychotic agents: N/a  -continue melatonin at HS. Add scheduled trazodone 50mg  as well, change time to 8pm.  5. Neuropsych: This patient is not capable of making decisions on his own behalf. 6. Skin/Wound Care: Routine pressure relief measures. 7. Fluids/Electrolytes/Nutrition: Monitor I/O. Check Lytes in am.              --Added Ensure max to help with protein stores--->albumin 2.4.  8. CAD s/p PCI/stent: On ASA/Ticagrelor Lipitor, Isordil, hydralazine and metoprolol.             --monitor for symptoms with increase in activity.  9. VT arrest: Monitor HR tid             --LifeVest to be placed on 02/17  -HR well controlled- continue metoprolol QD 10. T2DM--new diagnosis: Hgb A1c-7. BS controlled 2/22.              --continue to monitor BS ac/hs and use SSI as indicated.              --consider amaryl trial if needed 11.BPH/ Urinary retention/hematuria: Foley replaced on 02/17--will order UA/UCS for work up             --Has seen Dr. Matilde Sprang in the past             --continue Flomax.      Still not emptying. flomax increased to 0.8mg   -pt seems ok right now with I/O  caths  -explained  to him options are home with I/O caths or foley if he doesn't start emptying by discharge   -no cholinergics d/t CAD/CV 12. Acoustic neuroma and other menigioma- loss of hearing on R? side: Chronic dizziness/HA for months per records review.              --followed by Dr. Dema Severin at [facility] (was scheduled for Gamma knife radiosurgery)  -  meclizine prn  -2/22 scopolamine patch trial 13. Acute on chronic renal failure Stage IIIa: Avoid nephrotoxic medications. SCr was 1.38 at admission and has improved to 2.74  2/21               --Continue bicarb for metabolic acidosis.  14. Anemia of chronic disease: Monitor H/H for trends ---11.2-->9.0             --low iron/TIBC with elevated Ferritin-->             --received Nulecit X 3 doses and Aranesp  SQ 02/09      LOS: 6 days A FACE TO FACE EVALUATION WAS PERFORMED  Krutika P Raulkar 11/04/2020, 11:21 AM

## 2020-11-04 NOTE — Progress Notes (Signed)
Occupational Therapy Session Note  Patient Details  Name: Timothy Moran MRN: [id number] Date of Birth: 27-Feb-1946  Today's Date: 11/04/2020 OT Individual Time: 1340-1435 OT Individual Time Calculation (min): 55 min    Short Term Goals: Week 1:  OT Short Term Goal 1 (Week 1): pt will transfer to toilet wiht CGA consistently OT Short Term Goal 2 (Week 1): pt will stand for clothing managment wiht no symptoms of dizziness OT Short Term Goal 3 (Week 1): Pt will use breathing techniques to manage anxiety PRN with no more than min cues OT Short Term Goal 4 (Week 1): Pt will don shirt wiht set up  Skilled Therapeutic Interventions/Progress Updates:    1:1. Pt received in bed agreeable to OT. Pt completes ambulatory transfer to/from bathroom without AD and CGA with pt reaching for walls and furniture and VC for anterior weight shift during sit to stand transition. Pt requires steadying A during CM and VC for pulling pants to knees prior to standing up after attempted BM. Pt completes stanidng/balance activites in tx gym seated EOM and high low table wit frequent/significnat rest breaks d/t poor activity tolerance. 2x5e sit to stands with 1LE elevated on block, 3x5 standing alternating toe taps. Standing trials on foam wedge with focus on weight shift forward all with MIN A overall. 1 mod LOB posteriorly during alternating toe taps. Exited session with pt seated in bed, exit alarm on and call light in reach   Therapy Documentation Precautions:  Precautions Precautions: Fall Precaution Comments: cardiac monitor Restrictions Weight Bearing Restrictions: No General:   Vital Signs: Therapy Vitals Temp: 98 F (36.7 C) Temp Source: Oral Pulse Rate: 67 Resp: 17 BP: 122/77 Patient Position (if appropriate): Lying Oxygen Therapy SpO2: 94 % Pain:   ADL:   Vision   Perception    Praxis   Exercises:   Other Treatments:     Therapy/Group: Individual Therapy  Tonny Branch 11/04/2020, 6:55 AM

## 2020-11-04 NOTE — Progress Notes (Signed)
Physical Therapy Session Note  Patient Details  Name: Timothy Moran MRN: [id number] Date of Birth: May 06, 1946  Today's Date: 11/04/2020 PT Individual Time: 0804-0900 PT Individual Time Calculation (min): 56 min   Short Term Goals: Week 1:  PT Short Term Goal 1 (Week 1): Pt will tolerate assessment of stairs PT Short Term Goal 2 (Week 1): Pt will tolerate assessment of car transfers PT Short Term Goal 3 (Week 1): Pt will ambulate 18ft w/RW and cga PT Short Term Goal 4 (Week 1): Pt will perform bed to/from wc w/RW and cga  Skilled Therapeutic Interventions/Progress Updates:     Pt received supine in bed and agrees to therapy. Reports fatigue from lack of sleep but no complaint of pain. Supine to sit with bed features and cues to move slowly due to tendency to become dizzy. Pt performs stand pivot transfer to [facility] with CGA and no AD. WC transport outside for mobility training on open environment, with ambulation over unlevel and varying surfaces. Pt performs sit to stand to RW with supervision and cues for hand placement. Pt ambulates 3x80' with RW and CGA, verbalizing fatigue and demonstrating decreased safety when transferring back to [facility], "throwing" self toward chair prior to being safely in front of WC. PT provides education on sequencing and safety and pt verbalizes understanding. Pt then performs transfer training, initially with use of bilateral upper extremities, completing with supervision with cue son body mechanics and increased eccentric control while lowering self to seat. x10 total. Pt then attempts sit to stand without use of arms, to increase challenge and facilitate improved sequencing and body mechanics. Pt initially does not shift weight sufficiently forward to complete transfer but with successive trials is able to complete with minA, then standing without arm support fo ~30 seconds to challenge balance. Pt completes x3 trials.  Pt left seated in WC with alarm intact and all needs  within reach.  Therapy Documentation Precautions:  Precautions Precautions: Fall Precaution Comments: cardiac monitor Restrictions Weight Bearing Restrictions: No    Therapy/Group: Individual Therapy  Breck Coons, PT, DPT 11/04/2020, 12:33 PM

## 2020-11-04 NOTE — Progress Notes (Signed)
Speech Language Pathology Daily Session Note  Patient Details  Name: Timothy Moran MRN: [id number] Date of Birth: July 09, 1946  Today's Date: 11/04/2020 SLP Individual Time: 1030-1055 SLP Individual Time Calculation (min): 25 min  Short Term Goals: Week 1: SLP Short Term Goal 1 (Week 1): Pt will use external memory aids to facilitate recall of daily information with supervision verbal cues. SLP Short Term Goal 2 (Week 1): Pt will complete mildly complex functional tasks wtih supervision verbal cues for functional problem solving. SLP Short Term Goal 3 (Week 1): Pt will recognize and correct errors in the moment during functional tasks with supervision verbal cues. SLP Short Term Goal 4 (Week 1): Pt will sustain his attention to functional tasks for 15 minute intervals with supervision verbal cues for redirection.  Skilled Therapeutic Interventions: Skilled treatment session focused on cognitive goals. SLP facilitated session with a functional conversation that focused on recall and anticipatory awareness. Patient independently recalled events from previous therapy session and asked appropriate questions regarding f/u with his life vest, difficulty with voiding, etc. SLP answered some questions but also educated on directing questions to physician and PA. Patient was overall Mod I for selective attention to conversation. Both the patient and his wife report that feel he is at his baseline level of cognitive functioning, therefore, skilled SLP f/u is not warranted at this time. Patient left upright in bed with alarm on and all needs within reach. Continue with current plan of care.      Pain No/Denies Pain   Therapy/Group: Individual Therapy  PAYNE, COURTNEY 11/04/2020, 12:49 PM

## 2020-11-05 LAB — GLUCOSE, CAPILLARY
Glucose-Capillary: 97 mg/dL (ref 70–99)
Glucose-Capillary: 133 mg/dL — ABNORMAL HIGH (ref 70–99)
Glucose-Capillary: 108 mg/dL — ABNORMAL HIGH (ref 70–99)
Glucose-Capillary: 112 mg/dL — ABNORMAL HIGH (ref 70–99)

## 2020-11-05 MED ORDER — CHLORHEXIDINE GLUCONATE CLOTH 2 % EX PADS
6.0000 | MEDICATED_PAD | Freq: Every day | CUTANEOUS | Status: DC
  Administered 2020-11-05: 6 via TOPICAL

## 2020-11-05 MED ORDER — LIDOCAINE HCL URETHRAL/MUCOSAL 2 % EX GEL: CUTANEOUS | Status: DC | PRN

## 2020-11-05 MED ORDER — CLONAZEPAM 0.25 MG PO TBDP: 0.2500 mg | ORAL_TABLET | Freq: Three times a day (TID) | ORAL | Status: DC | PRN

## 2020-11-05 NOTE — Progress Notes (Signed)
Physical Therapy Discharge Summary  Patient Details  Name: Timothy Moran MRN: [id number] Date of Birth: 09-21-1945  Today's Date: 11/05/2020 PT Individual Time: [phone] and [id number] PT Individual Time Calculation (min): 55 min and 56 min   Patient has met 10 of 10 long term goals due to improved activity tolerance, improved balance and increased strength.  Patient to discharge at an ambulatory level Independent.   Patient's care partner is independent to provide the necessary physical assistance at discharge.  Reasons goals not met: NA  Recommendation:  Patient will benefit from ongoing skilled PT services in outpatient setting to continue to advance safe functional mobility, address ongoing impairments in strength, balance, ambulation, and minimize fall risk.  Equipment: RW  Reasons for discharge: treatment goals met and discharge from hospital  Patient/family agrees with progress made and goals achieved: Yes   Skilled Therapeutic Interventions: 1st Session: Pt received supine in bed and agrees to therapy. Wife present for family education. Pt performs bed mobility from flat bed with verbal cues on sequencing and positioning. Pt performs sit to stand with RW with cues on hand placement. In standing pt verbalizes feeling "heat in ears and forehead", and sits back down. Following rest break, pt performs stand step transfer to [facility] with cues on RW management and pt demonstrating decreased safety and eccentric control when sitting on WC. WC transport to gym for time management. Pt performs car transfer and ramp navigation with PT demo prior to completing, and verbal cues on increasing proximity to RW for safety. Following extended seated rest break, pt ambulates x30' with RW, stopping and verbalizing increasing head in forehead and feeling that he "will black out". Pt's wife brings WC for pt as PT provides CGA for safety. PT educates pt on anxiety management and wife on ensuring chairs are  available around house in case pt has similar episodes at home. Wife verbalizes understanding. Pt requires increased time to recover from episode, with cues for relaxation and guided imagery. Pt left seated in WC with alarm intact and all needs within reach.  2nd Session: Pt received supine in bed and agrees to therapy. Bed mobility independent. Stand pivot transfer to [facility] with close supervision and cues for eccentric control of sitting. WC transport to gym for time management. Pt performs x4 steps with R hand rail. At top of platform pt does not get R foot fully on step and has LOB backward, requiring minA from PT to stabilize. PT educates pt on taking time during steps and shifting weight forward. On 2nd attempt pt completes x8 steps with R hand rail and close supervision. PT educates wife on safe guarding of pt during stair training and ambulation. Pt ambulates x150' with RW with cues for upright gaze to improve posture and balance and increasing bilateral stride length for improved safety and decreased risk for falls. Pt completes 3x5:00 on Nustep for endurance and strengthening as well as reciprocal coordination. Pt completes at workload of 5 with steps per minute >50.  Stand pivot from Nustep>WC>bed with cues on positioning. Pt left supine in bed with alarm intact and all needs within reach.  PT Discharge Precautions/Restrictions Precautions Precautions: Fall Precaution Comments: cardiac monitor Restrictions Weight Bearing Restrictions: No Vision/Perception  Perception Perception: Within Functional Limits Praxis Praxis: Intact  Cognition Overall Cognitive Status: Within Functional Limits for tasks assessed Arousal/Alertness: Awake/alert Orientation Level: Oriented X4 Safety/Judgment: Appears intact Sensation Sensation Light Touch: Appears Intact Coordination Gross Motor Movements are Fluid and Coordinated: Yes Fine Motor Movements are  Fluid and Coordinated: Yes Motor  Motor Motor:  Within Functional Limits  Mobility Bed Mobility Bed Mobility: Supine to Sit;Sit to Supine Rolling Right: Independent Rolling Left: Independent Supine to Sit: Independent Sit to Supine: Independent Transfers Transfers: Sit to Stand;Stand Pivot Transfers;Stand to Sit Sit to Stand: Supervision/Verbal cueing Stand to Sit: Supervision/Verbal cueing Stand Pivot Transfers: Supervision/Verbal cueing Stand Pivot Transfer Details: Verbal cues for safe use of DME/AE;Verbal cues for sequencing;Verbal cues for gait pattern Transfer (Assistive device): Rolling walker Locomotion  Gait Ambulation: Yes Gait Assistance: Supervision/Verbal cueing Gait Distance (Feet): 150 Feet Assistive device: Rolling walker Gait Assistance Details: Verbal cues for technique;Verbal cues for precautions/safety;Verbal cues for safe use of DME/AE Gait Gait: Yes Gait Pattern: Impaired Gait Pattern: Decreased step length - left;Decreased step length - right;Trunk flexed Gait velocity: reduced Stairs / Additional Locomotion Stairs: Yes Stairs Assistance: Supervision/Verbal cueing Stair Management Technique: One rail Right Number of Stairs: 8 Height of Stairs: 6 Ramp: Supervision/Verbal cueing Curb: Supervision/Verbal cueing Wheelchair Mobility Wheelchair Mobility: No  Trunk/Postural Assessment  Cervical Assessment Cervical Assessment:  (forward head) Thoracic Assessment Thoracic Assessment:  (rounded shoulders) Lumbar Assessment Lumbar Assessment:  (posterior pelvic tilt) Postural Control Postural Control: Deficits on evaluation (delayed balance strategies. improved from eval.)  Balance Balance Balance Assessed: Yes Dynamic Sitting Balance Dynamic Sitting - Balance Support: During functional activity;Feet supported Dynamic Sitting - Level of Assistance: 7: Independent Static Standing Balance Static Standing - Balance Support: During functional activity Static Standing - Level of Assistance: 5: Stand by  assistance Extremity Assessment   RLE Assessment General Strength Comments: grossly 4+/5 LLE Assessment General Strength Comments: grossly 4+/5    Breck Coons, PT, DPT 11/05/2020, 3:48 PM

## 2020-11-05 NOTE — Plan of Care (Signed)
  Problem: RH Balance Goal: LTG: Patient will maintain dynamic sitting balance (OT) Description: LTG:  Patient will maintain dynamic sitting balance with assistance during activities of daily living (OT) Outcome: Completed/Met Goal: LTG Patient will maintain dynamic standing with ADLs (OT) Description: LTG:  Patient will maintain dynamic standing balance with assist during activities of daily living (OT)  Outcome: Completed/Met   Problem: RH Grooming Goal: LTG Patient will perform grooming w/assist,cues/equip (OT) Description: LTG: Patient will perform grooming with assist, with/without cues using equipment (OT) Outcome: Completed/Met   Problem: RH Bathing Goal: LTG Patient will bathe all body parts with assist levels (OT) Description: LTG: Patient will bathe all body parts with assist levels (OT) Outcome: Completed/Met   Problem: RH Dressing Goal: LTG Patient will perform upper body dressing (OT) Description: LTG Patient will perform upper body dressing with assist, with/without cues (OT). Outcome: Completed/Met Goal: LTG Patient will perform lower body dressing w/assist (OT) Description: LTG: Patient will perform lower body dressing with assist, with/without cues in positioning using equipment (OT) Outcome: Completed/Met   Problem: RH Toilet Transfers Goal: LTG Patient will perform toilet transfers w/assist (OT) Description: LTG: Patient will perform toilet transfers with assist, with/without cues using equipment (OT) Outcome: Completed/Met   Problem: RH Tub/Shower Transfers Goal: LTG Patient will perform tub/shower transfers w/assist (OT) Description: LTG: Patient will perform tub/shower transfers with assist, with/without cues using equipment (OT) Outcome: Completed/Met   Problem: RH Memory Goal: LTG Patient will demonstrate ability for day to day recall/carry over during activities of daily living with assistance level (OT) Description: LTG:  Patient will demonstrate  ability for day to day recall/carry over during activities of daily living with assistance level (OT). Outcome: Completed/Met   Problem: RH Attention Goal: LTG Patient will demonstrate this level of attention during functional activites (OT) Description: LTG:  Patient will demonstrate this level of attention during functional activites  (OT) Outcome: Completed/Met   Problem: RH Awareness Goal: LTG: Patient will demonstrate awareness during functional activites type of (OT) Description: LTG: Patient will demonstrate awareness during functional activites type of (OT) Outcome: Completed/Met

## 2020-11-05 NOTE — Progress Notes (Signed)
Speech Language Pathology Discharge Summary  Patient Details  Name: Timothy Moran MRN: [id number] Date of Birth: Jun 13, 1946   Patient has met 4 of 4 long term goals.  Patient to discharge at overall Modified Independent level.   Reasons goals not met: N/A   Clinical Impression/Discharge Summary: Patient has made excellent gains and has met 4 of 4 LTGs this admission. Currently, patient is overall Mod I for functional problem solving, recall of daily information, sustained attention and emergent awareness. Patient and family education is complete and patient will discharge home with 24 hour supervision from family. Patient and wife report patient is at his baseline level of cognitive functioning, therefore, skilled SLP f/u is not warranted at this time. Patient and wife verbalized understanding and agreement.   Care Partner:  Caregiver Able to Provide Assistance: Yes  Type of Caregiver Assistance: Physical  Recommendation:  None      Equipment: N/A   Reasons for discharge: Discharged from hospital;Treatment goals met   Patient/Family Agrees with Progress Made and Goals Achieved: Yes    [place], Mellette 11/05/2020, 6:17 AM

## 2020-11-05 NOTE — Progress Notes (Signed)
Occupational Therapy Discharge Summary  Patient Details  Name: Timothy Moran MRN: [id number] Date of Birth: June 12, 1946  Today's Date: 11/05/2020 OT Individual Time: 1000-1100 OT Individual Time Calculation (min): 60 min    OT treatment session focused on family education and dc planning. Pt reported continued anxiety and states he feels like he is going to black out at times due to it. Pt states he feels it coming on. OT educated on deep breathing and visualization strategies to decrease anxiety prior to ambulation. Pt brought down to therapy apartment and practiced simulated walk-in shower transfers. Educated on uses of 3-in-1 BSC at home over commode or in shower as needed. Pt then brought to dayroom and was issued UB home exercise program using level 2 theraband. OT went through each exercise with pt and had him complete 5 reps of each. Pt ambulated 20 feet in gym, then became very anxious and needed to sit, wife following with wc. After deep breathing exercises and rest, pt able to ambulate back to room with RW and close supervision. Pt returned to bed at end of session and left with bed alarm on, call bell in reach, and needs met.   Patient has met 11 of 11 long term goals due to improved activity tolerance, improved balance, postural control, ability to compensate for deficits, improved attention, improved awareness and improved coordination.  Patient to discharge at overall Supervision level.  Patient's care partner is independent to provide the necessary physical assistance at discharge.    Reasons goals not met: n/a  Recommendation:  Patient will benefit from ongoing skilled OT services in outpatient setting to continue to advance functional skills in the area of BADL.  Equipment: RW, 3-in-1 BSC  Reasons for discharge: treatment goals met and discharge from hospital  Patient/family agrees with progress made and goals achieved: Yes  OT Discharge Precautions/Restrictions   Precautions Precautions: Fall Precaution Comments: cardiac monitor Restrictions Weight Bearing Restrictions: No General   Vital Signs Therapy Vitals Temp: 98.3 F (36.8 C) Pulse Rate: 60 Resp: 17 BP: 113/62 Patient Position (if appropriate): Lying Oxygen Therapy SpO2: 96 % O2 Device: Room Air Pain   ADL ADL Eating: Independent Grooming: Independent Upper Body Bathing: Supervision/safety Lower Body Bathing: Supervision/safety Upper Body Dressing: Supervision/safety Lower Body Dressing: Supervision/safety Toileting: Supervision/safety Toilet Transfer: Close supervision Tub/Shower Transfer: Close supervison Vision Baseline Vision/History: Wears glasses Perception  Perception: Within Functional Limits Praxis Praxis: Intact Cognition Overall Cognitive Status: Within Functional Limits for tasks assessed Arousal/Alertness: Awake/alert Orientation Level: Oriented X4 Attention: Sustained Awareness: Appears intact Problem Solving: Appears intact Safety/Judgment: Appears intact Sensation Sensation Light Touch: Appears Intact Proprioception: Appears Intact Coordination Gross Motor Movements are Fluid and Coordinated: Yes Fine Motor Movements are Fluid and Coordinated: Yes Motor  Motor Motor: Within Functional Limits Mobility  Bed Mobility Bed Mobility: Supine to Sit;Sit to Supine Rolling Right: Independent Rolling Left: Independent Right Sidelying to Sit: Supervision/Verbal cueing Supine to Sit: Independent Sit to Supine: Independent Transfers Sit to Stand: Supervision/Verbal cueing Stand to Sit: Supervision/Verbal cueing  Trunk/Postural Assessment  Cervical Assessment Cervical Assessment:  (forward head) Thoracic Assessment Thoracic Assessment:  (rounded shoulders) Lumbar Assessment Lumbar Assessment:  (posterior pelvic tilt) Postural Control Postural Control: Deficits on evaluation (delayed balance strategies. improved from eval.)   Balance Balance Balance Assessed: Yes Dynamic Sitting Balance Dynamic Sitting - Balance Support: During functional activity;Feet supported Dynamic Sitting - Level of Assistance: 7: Independent Static Standing Balance Static Standing - Balance Support: During functional activity Static Standing - Level of Assistance:  5: Stand by assistance Extremity/Trunk Assessment RUE Assessment RUE Assessment: Within Functional Limits General Strength Comments: generalized weakness LUE Assessment LUE Assessment: Within Functional Limits General Strength Comments: generalized weakness   Valma Cava 11/05/2020, 3:49 PM

## 2020-11-05 NOTE — Progress Notes (Signed)
PROGRESS NOTE   Subjective/Complaints: Wife and patient are concerned regarding urinary retention- he still has not voided. Urinary catheterization is very painful for him and so his wife does not feel comfortable doing this. She provided me with his urologist's phone number, Dr. Vikki Ports, [phone]  ROS: Patient denies fever, rash, sore throat, blurred vision, nausea, vomiting, diarrhea, cough, shortness of breath or chest pain, joint or back pain, headache, or mood change.    Objective:   No results found. No results for input(s): WBC, HGB, HCT, PLT in the last 72 hours. No results for input(s): NA, K, CL, CO2, GLUCOSE, BUN, CREATININE, CALCIUM in the last 72 hours.  Intake/Output Summary (Last 24 hours) at 11/05/2020 1116 Last data filed at 11/05/2020 0900 Gross per 24 hour  Intake 560 ml  Output 2125 ml  Net -1565 ml        Physical Exam: Vital Signs Blood pressure 129/77, pulse 64, temperature 98.1 F (36.7 C), resp. rate 16, height 6\' 1"  (1.854 m), weight 97.6 kg, SpO2 94 %.  Gen: no distress, normal appearing HEENT: oral mucosa pink and moist, NCAT Cardio: Reg rate Chest: normal effort, normal rate of breathing Abd: soft, non-distended Ext: no edema Psych: pleasant, normal affect Skin: intact  Musculoskeletal:     Cervical/ back: Normal range of motion and neck supple.    Neurological:     Mental Status: He is oriented to person, place, and time.     Comments: follows commands. Some STM deficits. Improving insight and awareness. Motor 4 to 4+/5 all 4's. No sensory findings  Assessment/Plan: 1. Functional deficits which require 3+ hours per day of interdisciplinary therapy in a comprehensive inpatient rehab setting.  Physiatrist is providing close team supervision and 24 hour management of active medical problems listed below.  Physiatrist and rehab team continue to assess barriers to discharge/monitor  patient progress toward functional and medical goals  Care Tool:  Bathing  Bathing activity did not occur: Refused Body parts bathed by patient: Right arm,Left arm,Right upper leg,Left upper leg,Face,Chest,Abdomen   Body parts bathed by helper: Front perineal area,Buttocks,Left lower leg,Right lower leg     Bathing assist Assist Level: Minimal Assistance - Patient > 75%     Upper Body Dressing/Undressing Upper body dressing   What is the patient wearing?: Pull over shirt    Upper body assist Assist Level: Minimal Assistance - Patient > 75%    Lower Body Dressing/Undressing Lower body dressing      What is the patient wearing?: Pants,Underwear/pull up     Lower body assist Assist for lower body dressing: Minimal Assistance - Patient > 75%     Toileting Toileting Toileting Activity did not occur (Clothing management and hygiene only): Refused  Toileting assist Assist for toileting: Moderate Assistance - Patient 50 - 74%     Transfers Chair/bed transfer  Transfers assist     Chair/bed transfer assist level: Contact Guard/Touching assist Chair/bed transfer assistive device: Programmer, multimedia   Ambulation assist      Assist level: Contact Guard/Touching assist Assistive device: Walker-rolling Max distance: 80'   Walk 10 feet activity   Assist     Assist  level: Contact Guard/Touching assist Assistive device: Walker-rolling   Walk 50 feet activity   Assist Walk 50 feet with 2 turns activity did not occur: Safety/medical concerns (limited by anxiety)  Assist level: Contact Guard/Touching assist Assistive device: Walker-rolling    Walk 150 feet activity   Assist Walk 150 feet activity did not occur: Safety/medical concerns         Walk 10 feet on uneven surface  activity   Assist Walk 10 feet on uneven surfaces activity did not occur: Safety/medical concerns         Wheelchair     Assist Will patient use wheelchair  at discharge?: No             Wheelchair 50 feet with 2 turns activity    Assist            Wheelchair 150 feet activity     Assist          Blood pressure 129/77, pulse 64, temperature 98.1 F (36.7 C), resp. rate 16, height 6\' 1"  (1.854 m), weight 97.6 kg, SpO2 94 %.  Medical Problem List and Plan: 1.  Anoxic brain injury secondary to VF cardiac arrest with EF of 20-25% requiring Lifevest             -patient may remove life vest to shower             -ELOS/Goals: 10-14 days- mod I to supervision            Continue CIR  2.  Antithrombotics: -DVT/anticoagulation:  Pharmaceutical: Continue Lovenox--added 02/17 due to immobility and prior hx of PE (hx of PE ~ 10 years ago per wife).              -antiplatelet therapy: ASA/Brillinta  3. Pain Management: Continue Oxycodone prn for chest wall pain.  4. Mood/Sleep: LCSW to follow for evaluation and support.              -antipsychotic agents: N/a  -continue melatonin at HS. Add scheduled trazodone 50mg  as well, change time to 8pm.  5. Neuropsych: This patient is not capable of making decisions on his own behalf. 6. Skin/Wound Care: Routine pressure relief measures. 7. Fluids/Electrolytes/Nutrition: Monitor I/O. Check Lytes in am.              --Added Ensure max to help with protein stores--->albumin 2.4.  8. CAD s/p PCI/stent: On ASA/Ticagrelor Lipitor, Isordil, hydralazine and metoprolol.             --monitor for symptoms with increase in activity.  9. VT arrest: Monitor HR tid             --LifeVest to be placed on 02/17  -HR well controlled- continue metoprolol QD 10. T2DM--new diagnosis: Hgb A1c-7. BS controlled 2/22.              --continue to monitor BS ac/hs and use SSI as indicated.              --consider amaryl trial if needed 11.BPH/ Urinary retention/hematuria: Foley replaced on 02/17--will order UA/UCS for work up             --Has seen Dr. Matilde Sprang in the past             --continue Flomax.       Still not emptying. flomax increased to 0.8mg   -pt seems ok right now with I/O caths  -explained to him options are home with I/O caths or foley if  he doesn't start emptying by discharge   -no cholinergics d/t CAD/CV  -will call urology to see if they have any additional ideas. Discussed above options again with patient.  12. Acoustic neuroma and other menigioma- loss of hearing on R? side: Chronic dizziness/HA for months per records review.              --followed by Dr. Dema Severin at [facility] (was scheduled for Gamma knife radiosurgery)  -  meclizine prn  -2/22 scopolamine patch trial 13. Acute on chronic renal failure Stage IIIa: Avoid nephrotoxic medications. SCr was 1.38 at admission and has improved to 2.74  2/21               --Continue bicarb for metabolic acidosis.  14. Anemia of chronic disease: Monitor H/H for trends ---11.2-->9.0             --low iron/TIBC with elevated Ferritin-->             --received Nulecit X 3 doses and Aranesp  SQ 02/09      LOS: 7 days A FACE TO FACE EVALUATION WAS PERFORMED  Alyzah Pelly P Kailiana Granquist 11/05/2020, 11:16 AM

## 2020-11-06 LAB — GLUCOSE, CAPILLARY: Glucose-Capillary: 93 mg/dL (ref 70–99)

## 2020-11-06 MED ORDER — HYDRALAZINE HCL 25 MG PO TABS: 25.0000 mg | ORAL_TABLET | Freq: Three times a day (TID) | ORAL | 0 refills | Status: DC

## 2020-11-06 MED ORDER — DOCUSATE SODIUM 100 MG PO CAPS: 200.0000 mg | ORAL_CAPSULE | Freq: Every day | ORAL | 0 refills | Status: DC

## 2020-11-06 MED ORDER — ACETAMINOPHEN 325 MG PO TABS: 325.0000 mg | ORAL_TABLET | ORAL | Status: DC | PRN

## 2020-11-06 MED ORDER — ISOSORBIDE DINITRATE 30 MG PO TABS: 30.0000 mg | ORAL_TABLET | Freq: Two times a day (BID) | ORAL | 0 refills | Status: DC

## 2020-11-06 MED ORDER — TAMSULOSIN HCL 0.4 MG PO CAPS: 0.4000 mg | ORAL_CAPSULE | Freq: Every day | ORAL | 0 refills | Status: DC

## 2020-11-06 MED ORDER — ATORVASTATIN CALCIUM 80 MG PO TABS: 80.0000 mg | ORAL_TABLET | Freq: Every day | ORAL | 0 refills | Status: DC

## 2020-11-06 MED ORDER — SCOPOLAMINE 1 MG/3DAYS TD PT72: 1.0000 | MEDICATED_PATCH | TRANSDERMAL | 0 refills | Status: DC

## 2020-11-06 MED ORDER — TRAZODONE HCL 50 MG PO TABS: 50.0000 mg | ORAL_TABLET | Freq: Every day | ORAL | 0 refills | Status: DC

## 2020-11-06 MED ORDER — TICAGRELOR 90 MG PO TABS: 90.0000 mg | ORAL_TABLET | Freq: Two times a day (BID) | ORAL | 0 refills | Status: DC

## 2020-11-06 MED ORDER — METOPROLOL SUCCINATE ER 50 MG PO TB24: 50.0000 mg | ORAL_TABLET | Freq: Every day | ORAL | 0 refills | Status: DC

## 2020-11-06 MED ORDER — SENNOSIDES-DOCUSATE SODIUM 8.6-50 MG PO TABS: 1.0000 | ORAL_TABLET | Freq: Every day | ORAL | 0 refills | Status: DC

## 2020-11-06 MED ORDER — MELATONIN 5 MG PO TABS: 5.0000 mg | ORAL_TABLET | Freq: Every day | ORAL | 0 refills | Status: AC

## 2020-11-06 MED ORDER — SODIUM BICARBONATE 650 MG PO TABS: 1300.0000 mg | ORAL_TABLET | Freq: Three times a day (TID) | ORAL | 0 refills | Status: DC

## 2020-11-06 MED ORDER — ADULT MULTIVITAMIN W/MINERALS CH: 1.0000 | ORAL_TABLET | Freq: Every day | ORAL | Status: AC

## 2020-11-06 NOTE — Progress Notes (Signed)
PROGRESS NOTE   Subjective/Complaints: Ready for d/c today Discussed his urinary retention- foley w/ follow-up with urology He feels that the Klonopin helped with his anxiety and helped him to sleep better last night- he does not fee that he will need it when he returns home.   ROS: Patient denies fever, rash, sore throat, blurred vision, nausea, vomiting, diarrhea, cough, shortness of breath or chest pain, joint or back pain, headache, or mood change.    Objective:   No results found. No results for input(s): WBC, HGB, HCT, PLT in the last 72 hours. No results for input(s): NA, K, CL, CO2, GLUCOSE, BUN, CREATININE, CALCIUM in the last 72 hours.  Intake/Output Summary (Last 24 hours) at 11/06/2020 0919 Last data filed at 11/06/2020 0900 Gross per 24 hour  Intake 400 ml  Output 1400 ml  Net -1000 ml        Physical Exam: Vital Signs Blood pressure 117/69, pulse 62, temperature 98 F (36.7 C), temperature source Oral, resp. rate 18, height 6\' 1"  (1.854 m), weight 96.8 kg, SpO2 93 %.  Gen: no distress, normal appearing HEENT: oral mucosa pink and moist, NCAT Cardio: Reg rate Chest: normal effort, normal rate of breathing Abd: soft, non-distended Ext: no edema Psych: pleasant, normal affect Skin: intact Musculoskeletal:     Cervical/ back: Normal range of motion and neck supple.    Neurological:     Mental Status: He is oriented to person, place, and time.     Comments: follows commands. Some STM deficits. Improving insight and awareness. Motor 4 to 4+/5 all 4's. No sensory findings  Assessment/Plan: 1. Functional deficits which require 3+ hours per day of interdisciplinary therapy in a comprehensive inpatient rehab setting.  Physiatrist is providing close team supervision and 24 hour management of active medical problems listed below.  Physiatrist and rehab team continue to assess barriers to discharge/monitor  patient progress toward functional and medical goals  Care Tool:  Bathing  Bathing activity did not occur: Refused Body parts bathed by patient: Right arm,Left arm,Right upper leg,Left upper leg,Face,Chest,Abdomen,Buttocks,Front perineal area,Right lower leg,Left lower leg   Body parts bathed by helper: Front perineal area,Buttocks,Left lower leg,Right lower leg     Bathing assist Assist Level: Supervision/Verbal cueing     Upper Body Dressing/Undressing Upper body dressing   What is the patient wearing?: Pull over shirt    Upper body assist Assist Level: Supervision/Verbal cueing    Lower Body Dressing/Undressing Lower body dressing      What is the patient wearing?: Underwear/pull up,Pants     Lower body assist Assist for lower body dressing: Supervision/Verbal cueing     Toileting Toileting Toileting Activity did not occur (Clothing management and hygiene only): Refused  Toileting assist Assist for toileting: Supervision/Verbal cueing     Transfers Chair/bed transfer  Transfers assist     Chair/bed transfer assist level: Supervision/Verbal cueing Chair/bed transfer assistive device: Programmer, multimedia   Ambulation assist      Assist level: Supervision/Verbal cueing Assistive device: Walker-rolling Max distance: 150'   Walk 10 feet activity   Assist     Assist level: Supervision/Verbal cueing Assistive device: Walker-rolling  Walk 50 feet activity   Assist Walk 50 feet with 2 turns activity did not occur: Safety/medical concerns (limited by anxiety)  Assist level: Supervision/Verbal cueing Assistive device: Walker-rolling    Walk 150 feet activity   Assist Walk 150 feet activity did not occur: Safety/medical concerns  Assist level: Supervision/Verbal cueing Assistive device: Walker-rolling    Walk 10 feet on uneven surface  activity   Assist Walk 10 feet on uneven surfaces activity did not occur: Safety/medical  concerns   Assist level: Supervision/Verbal cueing Assistive device: Aeronautical engineer Will patient use wheelchair at discharge?: No             Wheelchair 50 feet with 2 turns activity    Assist            Wheelchair 150 feet activity     Assist          Blood pressure 117/69, pulse 62, temperature 98 F (36.7 C), temperature source Oral, resp. rate 18, height 6\' 1"  (1.854 m), weight 96.8 kg, SpO2 93 %.  Medical Problem List and Plan: 1.  Anoxic brain injury secondary to VF cardiac arrest with EF of 20-25% requiring Lifevest             -patient may remove life vest to shower             -ELOS/Goals: 10-14 days- mod I to supervision            Continue CIR  2.  Antithrombotics: -DVT/anticoagulation:  Pharmaceutical: Continue Lovenox--added 02/17 due to immobility and prior hx of PE (hx of PE ~ 10 years ago per wife).              -antiplatelet therapy: ASA/Brillinta  3. Pain Management: Continue Oxycodone prn for chest wall pain.  4. Mood/Sleep: LCSW to follow for evaluation and support.              -antipsychotic agents: N/a  -continue melatonin at HS. Add scheduled trazodone 50mg  as well, change time to 8pm.  5. Neuropsych: This patient is not capable of making decisions on his own behalf. 6. Skin/Wound Care: Routine pressure relief measures. 7. Fluids/Electrolytes/Nutrition: Monitor I/O. Check Lytes in am.              --Added Ensure max to help with protein stores--->albumin 2.4.  8. CAD s/p PCI/stent: On ASA/Ticagrelor Lipitor, Isordil, hydralazine and metoprolol.             --monitor for symptoms with increase in activity.  9. VT arrest: Monitor HR tid             --LifeVest to be placed on 02/17  -HR well controlled- continue metoprolol QD 10. T2DM--new diagnosis: Hgb A1c-7. BS controlled 2/22.              --continue to monitor BS ac/hs and use SSI as indicated.              --consider amaryl trial if needed 11.BPH/  Urinary retention/hematuria: Foley replaced on 02/17--will order UA/UCS for work up             --Has seen Dr. Matilde Sprang in the past             --continue Flomax.      Still not emptying. flomax increased to 0.8mg   -pt seems ok right now with I/O caths  -explained to him options are home with I/O caths or foley if he  doesn't start emptying by discharge   -no cholinergics d/t CAD/CV  -foley placed, f/u with urology scheduled.  12. Acoustic neuroma and other menigioma- loss of hearing on R? side: Chronic dizziness/HA for months per records review.              --followed by Dr. Dema Severin at [facility] (was scheduled for Gamma knife radiosurgery)  -  meclizine prn  -2/22 scopolamine patch trial 13. Acute on chronic renal failure Stage IIIa: Avoid nephrotoxic medications. SCr was 1.38 at admission and has improved to 2.74  2/21               --Continue bicarb for metabolic acidosis.  14. Anemia of chronic disease: Monitor H/H for trends ---11.2-->9.0             --low iron/TIBC with elevated Ferritin-->             --received Nulecit X 3 doses and Aranesp  SQ 02/09    >30 minutes spent in discharge of patient including review of medications and follow-up appointments, physical examination, and in answering all patient's questions    LOS: 8 days A FACE TO FACE EVALUATION WAS PERFORMED  Clide Deutscher Kasen Adduci 11/06/2020, 9:19 AM

## 2020-11-06 NOTE — Discharge Summary (Signed)
Physician Discharge Summary  Patient ID: Timothy Moran MRN: [id number] DOB/AGE: May 21, 1946 75 y.o.  Admit date: 10/29/2020 Discharge date: 11/06/2020  Discharge Diagnoses:  Principal Problem:   Anoxic brain injury [facility]) Active Problems:   Chronic kidney disease, stage III (moderate) (HCC)   Acute blood loss anemia   DM (diabetes mellitus), type 2 ([place]) new   Urinary retention   CAD in native artery residual disease of LAD, may need PCI, treating medically for now   Discharged Condition: Stable  Significant Diagnostic Studies:   Labs:  Basic Metabolic Panel: BMP Latest Ref Rng & Units 11/02/2020 10/30/2020 10/28/2020  Glucose 70 - 99 mg/dL 103(H) 117(H) 82  BUN 8 - 23 mg/dL 28(H) 28(H) 34(H)  Creatinine 0.61 - 1.24 mg/dL 2.74(H) 2.83(H) 3.01(H)  BUN/Creat Ratio 10 - 24 - - -  Sodium 135 - 145 mmol/L 138 139 142  Potassium 3.5 - 5.1 mmol/L 3.6 3.9 4.2  Chloride 98 - 111 mmol/L 106 105 109  CO2 22 - 32 mmol/L 20(L) 22 20(L)  Calcium 8.9 - 10.3 mg/dL 8.4(L) 8.5(L) 8.4(L)    CBC: CBC Latest Ref Rng & Units 11/02/2020 10/30/2020 10/27/2020  WBC 4.0 - 10.5 K/uL 6.6 8.4 9.9  Hemoglobin 13.0 - 17.0 g/dL 9.0(L) 9.0(L) 9.0(L)  Hematocrit 39.0 - 52.0 % 27.2(L) 27.3(L) 27.6(L)  Platelets 150 - 400 K/uL 284 333 443(H)    CBG: Recent Labs  Lab 11/05/20 0645 11/05/20 1135 11/05/20 1625 11/05/20 2114 11/06/20 0606  GLUCAP 97 133* 108* 112* 93    Brief HPI:   Jareth Pardee is a 75 y.o. male with history of multiple meningiomas, acoustic neuroma, CKD 3, PE, CHF and cardiomyopathy who was admitted on 10/04/2018 after witnessed collapse with V. fib arrest requiring ACLS protocol.  He was started on IV amiodarone, found to have STEMI and underwent cardiac cath revealing two-vessel CAD with thrombotic occlusion of distal left circumflex which was treated with PCI and drug-eluting stent.  He was treated with cooling protocol and noted to be unresponsive with nonpurposeful movement.   CT of head done revealing stable left parafalcine and anterior left frontal meningiomas without mass-effect.  He tolerated extubation without difficulty however had bouts of agitation with confusion.   Hospital course was significant for issues with leukocytosis, metabolic acidosis as well as CHF with hypoxemia requiring BiPAP.  He was treated with IV antibiotics due to concerns of VAP and was found to have urinary retention contributing to acute on chronic renal failure.  Foley was placed with improvement in BUN and serum creatinine and nephrology recommended strict monitoring of I's and O's.  He has also had issues with episode of hematuria, insomnia with ongoing confusion as well as cognitive deficits with weakness.  He was noted to have limitations in mobility and ADLs and  CIR was recommended due to functional decline.   Hospital Course: Jamesyn Lindell was admitted to rehab 10/29/2020 for inpatient therapies to consist of PT, ST and OT at least three hours five days a week. Past admission physiatrist, therapy team and rehab RN have worked together to provide customized collaborative inpatient rehab. His blood pressures were monitored on TID basis and has been reasonably controlled.  He continued to have issues with dizziness affecting activity tolerance and scopolamine patch was added with improvement in symptoms.  Ensure Max  was added to help with low protein stores.  He continues on aspirin/Brilinta and is tolerating these without side effects.  He has tolerated increase in activity without reports of  chest pain or arrhythmias.  Acute on chronic renal failure is resolving with serial check of electrolytes revealing slow steady improvement.  Follow-up CBC shows acute blood loss anemia is stable.   Foley was discontinued and Flomax was increased to 0.8 mg to help with voiding function.  He continued to have urinary retention requiring in and out catheterizations however these were very painful to him  and wife did not feel that she could perform them therefore Foley was replaced on 02/24.  He has been set up with urology for voiding trial after discharge.  Patient with new diagnosis of diabetes with hemoglobin A1c 7.0.  His blood sugars have been monitored with ac/hs CBG checks and SSI was use prn for tighter BS control.  Blood sugars are reasonably controlled on diet alone and he is to follow with PCP for further management of hypoglycemic regimen.   Rehab course: During patient's stay in rehab weekly team conference was held to monitor patient's progress, set goals and discuss barriers to discharge. At admission, patient required mod assist with basic ADL tasks and min assist with mobility.  He exhibited mild cognitive deficits and no overt signs symptoms of aspiration noted with current diet. He  has had improvement in activity tolerance, balance, postural control as well as ability to compensate for deficits.  He is able to complete ADL tasks at modified independent level.  He requires supervision for transfers and to ambulate 150 feet with rolling walker.  He is able to complete functional problem-solving tasks and sustain attention at modified independent level.  Family education was completed with wife.  Disposition: Home  Diet: heart healthy/Carb modified.   Special Instructions: 1. No driving or strenuous activity till cleared by MD.  2. Needs to wear LifeVest at all times--may remove to shower with supervision.   Discharge Instructions    Ambulatory referral to Occupational Therapy   Complete by: As directed    Eval and treat   Ambulatory referral to Physical Medicine Rehab   Complete by: As directed    3-4 week f/u appt   Ambulatory referral to Physical Therapy   Complete by: As directed    Eval and treat   Iontophoresis - 4 mg/ml of dexamethasone: No   T.E.N.S. Unit Evaluation and Dispense as Indicated: No     Allergies as of 11/06/2020   No Known Allergies     Medication  List    STOP taking these medications   finasteride 5 MG tablet Commonly known as: PROSCAR   Fish Oil 1360 MG Caps   linagliptin 5 MG Tabs tablet Commonly known as: TRADJENTA   Magnesium 400 MG Caps   Vitamin D3 125 MCG (5000 UT) Tabs     TAKE these medications   acetaminophen 325 MG tablet Commonly known as: TYLENOL Take 1-2 tablets (325-650 mg total) by mouth every 4 (four) hours as needed for mild pain.   aspirin 81 MG chewable tablet Chew 1 tablet (81 mg total) by mouth daily.   atorvastatin 80 MG tablet Commonly known as: LIPITOR Take 1 tablet (80 mg total) by mouth daily.   docusate sodium 100 MG capsule Commonly known as: COLACE Take 2 capsules (200 mg total) by mouth daily. Notes to patient: Over the counter   hydrALAZINE 25 MG tablet Commonly known as: APRESOLINE Take 1 tablet (25 mg total) by mouth every 8 (eight) hours.   isosorbide dinitrate 30 MG tablet Commonly known as: ISORDIL Take 1 tablet (30 mg total) by mouth  2 (two) times daily.   melatonin 5 MG Tabs Take 1 tablet (5 mg total) by mouth at bedtime.   metoprolol succinate 50 MG 24 hr tablet Commonly known as: TOPROL-XL Take 1 tablet (50 mg total) by mouth daily. Take with or immediately following a meal.   multivitamin with minerals Tabs tablet Take 1 tablet by mouth daily.   nitroGLYCERIN 0.4 MG SL tablet Commonly known as: NITROSTAT Place 1 tablet (0.4 mg total) under the tongue every 5 (five) minutes as needed for chest pain.   scopolamine 1 MG/3DAYS Commonly known as: TRANSDERM-SCOP Place 1 patch (1.5 mg total) onto the skin every 3 (three) days.   senna-docusate 8.6-50 MG tablet Commonly known as: Senokot-S Take 1 tablet by mouth at bedtime.   sodium bicarbonate 650 MG tablet Take 2 tablets (1,300 mg total) by mouth 3 (three) times daily.   SUPER B COMPLEX PO Take 1 tablet by mouth daily.   tamsulosin 0.4 MG Caps capsule Commonly known as: FLOMAX Take 1 capsule (0.4 mg  total) by mouth daily.   ticagrelor 90 MG Tabs tablet Commonly known as: BRILINTA Take 1 tablet (90 mg total) by mouth 2 (two) times daily.   traZODone 50 MG tablet Commonly known as: DESYREL Take 1 tablet (50 mg total) by mouth at bedtime.       Follow-up Information    Meredith Staggers, MD Follow up.   Specialty: Physical Medicine and Rehabilitation Why: Office will call you with follow up appointment Contact information: [address] [place] [address] [phone]        Richardson Dopp T, PA-C Follow up on 11/18/2020.   Specialties: Cardiology, Physician Assistant Why: Appointment at 8:45 Contact information: [address] [place] [address] [phone]        Cari Caraway, MD. Call.   Specialty: Family Medicine Why: for post hospital follow up Contact information: [place] [address] [phone]        Karen Kays, NP Follow up on 11/17/2020.   Specialty: Nurse Practitioner Why: Be there at 9:15 for 9:30 appointment/voiding trial.  Contact information: [address] [place] [address] [phone]               Signed: Bary Leriche 11/09/2020, 12:57 PM

## 2020-11-06 NOTE — Discharge Instructions (Signed)
   Inpatient Rehab Discharge Instructions  Deryck Hippler Discharge date and time: 11/06/20   Activities/Precautions/ Functional Status: Activity: no lifting, driving, or strenuous exercise till cleared bu MD Diet: cardiac diet and diabetic diet Wound Care: keep wound clean and dry    Functional status:  ___ No restrictions     ___ Walk up steps independently _X__ 24/7 supervision/assistance   ___ Walk up steps with assistance ___ Intermittent supervision/assistance  ___ Bathe/dress independently ___ Walk with walker     ___ Bathe/dress with assistance ___ Walk Independently    _X__ Shower independently ___ Walk with assistance    ___ Shower with assistance _X__ No alcohol     ___ Return to work/school ________    Special Instructions: 1. No driving for 6 months or till cleared by cardiology.    COMMUNITY REFERRALS UPON DISCHARGE:    Outpatient: PT     OT             Agency: [facility] Phone: [phone]             Appointment Date/Time:*Please expect follow-up within 7-10 business days to schedule your appointment. If you have not received follow-up, be sure to contact the site directly.*  Medical Equipment/Items Ordered: 3in1 bedside commode and rolling walker                                                 Agency/Supplier: [place] [phone]    My questions have been answered and I understand these instructions. I will adhere to these goals and the provided educational materials after my discharge from the hospital.  Patient/Caregiver Signature _______________________________ Date __________  Clinician Signature _______________________________________ Date __________  Please bring this form and your medication list with you to all your follow-up doctor's appointments.

## 2020-11-06 NOTE — Progress Notes (Signed)
Pt discharged home with family. DC instructions given by Jeannene Patella, PA. Foley care education and demonstration done with pt and spouse. Instructions handouts given. No further questions from pt ir family. Belongings and equipment sent with pt.   Gerald Stabs, RN

## 2020-11-06 NOTE — Progress Notes (Signed)
Inpatient Rehabilitation Care Coordinator Discharge Note  The overall goal for the admission was met for:   Discharge location: Yes. D/c to home with 24/7 care from wife.   Length of Stay: Yes. 7 days.   Discharge activity level: Yes. Independent.  Home/community participation: Yes. Limited.   Services provided included: MD, RD, PT, OT, SLP, RN, CM, TR, Pharmacy, Neuropsych and SW  Financial Services: Medicare and Private Insurance: [place] offered to/list presented to:Yes  Follow-up services arranged: Outpatient: [facility] for Outpatient PT/OT and DME: RW and [facility]in[facility] with [employer]  Comments (or additional information):  Patient/Family verbalized understanding of follow-up arrangements: Yes  Individual responsible for coordination of the follow-up plan: Contact pt wife Delcie Roch [phone]  Confirmed correct DME delivered: Rana Snare 11/06/2020    Rana Snare

## 2020-11-11 ENCOUNTER — Encounter: Payer: Self-pay | Admitting: Occupational Therapy

## 2020-11-11 ENCOUNTER — Other Ambulatory Visit: Payer: Self-pay

## 2020-11-11 ENCOUNTER — Ambulatory Visit: Payer: Medicare Other | Attending: Physical Medicine and Rehabilitation | Admitting: Occupational Therapy

## 2020-11-11 DIAGNOSIS — M25552 Pain in left hip: Secondary | ICD-10-CM | POA: Diagnosis not present

## 2020-11-11 DIAGNOSIS — M25652 Stiffness of left hip, not elsewhere classified: Secondary | ICD-10-CM | POA: Insufficient documentation

## 2020-11-11 DIAGNOSIS — G931 Anoxic brain damage, not elsewhere classified: Secondary | ICD-10-CM | POA: Diagnosis not present

## 2020-11-11 DIAGNOSIS — R2689 Other abnormalities of gait and mobility: Secondary | ICD-10-CM | POA: Insufficient documentation

## 2020-11-11 DIAGNOSIS — M6281 Muscle weakness (generalized): Secondary | ICD-10-CM | POA: Diagnosis not present

## 2020-11-11 DIAGNOSIS — R29898 Other symptoms and signs involving the musculoskeletal system: Secondary | ICD-10-CM | POA: Diagnosis not present

## 2020-11-12 ENCOUNTER — Telehealth: Payer: Self-pay | Admitting: *Deleted

## 2020-11-12 NOTE — Telephone Encounter (Signed)
Scopolamine patch APPROVED until further notice

## 2020-11-12 NOTE — Therapy (Signed)
[place]. [place], [place], [address] Phone: [phone]   Fax:  [phone]  Occupational Therapy Evaluation  Patient Details  Name: Timothy Moran MRN: [id number] Date of Birth: 13-Aug-1946 Referring Provider (OT): Reesa Chew, [place]   Encounter Date: 11/11/2020   OT End of Session - 11/11/20 1553    Visit Number 1    Number of Visits 5    Date for OT Re-Evaluation 12/25/20    Authorization Type Medicare    OT Start Time 1530    OT Stop Time 1618    OT Time Calculation (min) 48 min    Activity Tolerance Patient tolerated treatment well    Behavior During Therapy [facility] for tasks assessed/performed           Past Medical History:  Diagnosis Date  . Abnormal echocardiogram 06/07/2019  . Acute on chronic combined systolic and diastolic HF (heart failure) ([place]) 10/26/2020  . Anoxic brain injury ([place]) 10/26/2020  . Arrhythmia   . Ascending aortic aneurysm (HCC) 4.2 cm based on CT from December 2020 01/29/2018   4.3 cm by echo in 2019  . Benign brain tumor ([place])   . Benign neoplasm of brain ([place]) 12/19/2017  . CAD in native artery residual disease of LAD, may need PCI, treating medically for now 10/26/2020  . Chest pain 12/04/2017  . Chronic kidney disease    Stage 3  . Chronic kidney disease, stage III (moderate) ([place]) 12/19/2017  . Diabetes mellitus, type 2 ([place]) 10/06/2020   10/06/20 A1C 7%  . Dizziness 10/11/2019  . Hematuria 10/26/2020  . History of pulmonary embolus (PE)   . Hypertension   . Iron deficiency anemia rec'd IV iron 10/26/2020  . Ischemic cardiomyopathy 10/26/2020  . Left bundle branch block 12/19/2017  . Nonsustained ventricular tachycardia ([place]) 10/11/2019  . Pain of left hip joint 05/29/2020  . Personal history of pulmonary embolism 12/19/2017  . Pulmonary hypertension due to thromboembolism ([place]) 03/09/2018   See echo 12/27/17 with nl PAS vs CTa chest 02/23/18   . S/P angioplasty with stent 10/04/20 DES  to LCX  10/26/2020  . Sinus bradycardia 12/22/2017  . Solitary pulmonary nodule on lung CT 03/08/2018   CT 12/04/17 1.0 x 0.8 x 0.7 cm nodular opacity in the RUL vs not seen 03/16/10 posterior segment of the right upper lobe. [place]land KitchenSpirometry 03/08/2018    FEV1 3.86 (108%)  Ratio 96 s prior rx  - PET  03/13/18   Low grade c/w adenoca > rec T surgery eval    . Urinary retention 10/26/2020    Past Surgical History:  Procedure Laterality Date  . APPENDECTOMY    . CORONARY/GRAFT ACUTE MI REVASCULARIZATION N/A 10/04/2020   Procedure: Coronary/Graft Acute MI Revascularization;  Surgeon: Nelva Bush, MD;  Location: [facility];  Service: Cardiovascular;  Laterality: N/A;  . HERNIA REPAIR    . LEFT HEART CATH AND CORONARY ANGIOGRAPHY N/A 10/04/2020   Procedure: LEFT HEART CATH AND CORONARY ANGIOGRAPHY;  Surgeon: Nelva Bush, MD;  Location: [facility];  Service: Cardiovascular;  Laterality: N/A;    There were no vitals filed for this visit.   Subjective Assessment - 11/11/20 1535    Subjective  Pt reports he was shoveling snow when he noticed chest pains; his wife called EMS and while on the phone she reports he collapsed and was not responsive for approx 3 minutes before EMS arrived and initiated CPR. According to cardio ICU H&P, "he  received a total of 4 defibrillations by fire and EMS with initial rhythm of VF on EMS arrival and ROSC at 16 minutes." Pt reports he does not remember the 2 weeks or so that he spent in the hospital and that, according to medical personnel and his wife, he experienced significant confusion and agitation.    Patient is accompanied by: Family member   Timothy Moran (wife)   Pertinent History HTN, arrhythmia, hx of pulmonary embolus, meningiomas, stage III CKD, R upper and lower lobe lung nodules, ascending aortic aneurysm, and L bundle branch block    Patient Stated Goals Improve balance and endurance; "get back to the things I was doing before this happened"     Currently in Pain? No/denies             [facility] OT Assessment - 11/11/20 1542      Assessment   Medical Diagnosis Anoxic brain injury 2/2 cardiac arrest    Referring Provider (OT) Reesa Chew, PA-C    Onset Date/Surgical Date 10/04/20    Next MD Visit 11/18/20    Prior Therapy Yes      Precautions   Precaution Comments "No driving or strenuous activity till cleared by MD. Needs to wear LifeVest at all times--may remove to shower with supervision" (per inpatient rehab d/c summary)      Balance Screen   Has the patient fallen in the past 6 months No      Home  Environment   Family/patient expects to be discharged to: Private residence    Type of [place] Two level   Primary bed/bath on main level   Alternate Level Stairs - Number of Steps 13/14    [place] - 2 wheels;Shower seat;Tub bench;Grab bars - tub/shower;Hand held shower head   Has BSC but does not use it   Additional Comments Elevated toilet in primary bath    Lives With Spouse      Prior Function   Level of Independence Independent    Vocation Retired   Probation officer, Arts administrator games w/ friends, spending time w/ family, working in the yard      ADL   Eating/Feeding Independent    Grooming Independent    Upper Body Bathing Supervision/safety   Due to cardiovascular status   Lower Body Bathing Supervision/safety    Upper Body Dressing Supervision/safety   Occasionally requires assist to help manage LifeVest   Lower Body Dressing Supervision/safety   Occasionally requires assistance to help manage foley   Toilet Transfer Modified independent    Toileting - Clothing Manipulation Modified independent   Requires stable surface for stability/balance   Corporate investment banker seat with back;Transfer tub bench;Grab bars;Walk in shower      Mobility   Mobility Status Comments  Ambulation w/ RW at standby assist      Vision - History   Baseline Vision Wears glasses all the time   Bifocals     Vision Assessment   Ocular Range of Motion Within Functional Limits    Tracking/Visual Pursuits Able to track stimulus in all quads without difficulty      Activity Tolerance   Activity Tolerance Comments Dyspnea on exertion after ambulating short distance w/ RW      Cognition   Overall Cognitive Status Within Functional Limits for tasks assessed   Pt  does not recall the first 2 weeks or so in the hospital     Coordination   Gross Motor Movements are Fluid and Coordinated Yes    Fine Motor Movements are Fluid and Coordinated Yes    9 Hole Peg Test Right;Left    Right 9 Hole Peg Test 19 sec    Left 9 Hole Peg Test 22 sec    Coordination WFL      AROM   Overall AROM  Within functional limits for tasks performed   Assessed bilateral shoulders, elbows/forearms, wrists     Strength   Overall Strength Within functional limits for tasks performed    Overall Strength Comments Gross screening of BUE completed due to decreased endurance and precautions against strenuous activity      Hand Function   Right Hand Gross Grasp Functional    Left Hand Gross Grasp Functional            OT Education - 11/11/20 1541    Education Details Education provided on role and purpose of OT, as well as potential interventions and goals for therapy.    Person(s) Educated Patient;Spouse    Methods Explanation    Comprehension Verbalized understanding            OT Short Term Goals - 11/12/20 2049      OT SHORT TERM GOAL #1   Title Pt will be able to independently identify at least two energy conservation strategies to implement at home to improve safety w/ ADLs    Baseline Reports getting fatigued quickly during ADLs    Time 3    Period Weeks    Status New    Target Date 12/02/20      OT SHORT TERM GOAL #2   Title Pt will demonstrate safe transfer in/out of shower w/ Mod I     Baseline Currently standby assist for shower transfer    Time 3    Period Weeks    Status New             OT Long Term Goals - 11/12/20 2122      OT LONG TERM GOAL #1   Title Pt will be independent with HEP designed for light BUE strengthening to facilitate return to leisure activities    Baseline No current HEP due to medical precautions    Time 4    Period Weeks    Status New    Target Date 12/09/20      OT LONG TERM GOAL #2   Title Pt will demonstrate understanding of compensatory and safety strategies, using AE prn, to implement during higher level leisure activities (e.g. yardwork)    Baseline Unable to participate in higher level leisure activities at this time    Time 4    Period Weeks    Status New            Plan - 11/11/20 1554    Clinical Impression Statement Pt is a 75 y.o. male who presents to OP OT s/p cardiac arrest and anoxic brain injury that occurred 10/04/20. Pt was transferred from cardiovascular ICU to inpatient rehab on 10/29/20 and then was d/c home 11/06/20. Pt currently lives with his wife Timothy Moran) in a two-story house and is retired, previously working as an Arboriculturist. PMHx includes HTN, arrhythmia, ascending aortic aneurysm, L bundle branch block, hx of pulmonary emboli, meningiomas, stage III chronic kidney disease, R upper and lower lobe lung nodules, and a benign brain tumor. Pt will benefit  from skilled occupational therapy services to address decreased strength and endurance, energy conservation, and adaptive strategies to improve participation and safety during ADLs and IADLs.    Occupational performance deficits (Please refer to evaluation for details): ADL's;IADL's;Leisure    Body Structure / Function / Physical Skills ADL;Decreased knowledge of precautions;Balance;Decreased knowledge of use of DME;Body mechanics;Cardiopulmonary status limiting activity;Strength;Pain;Endurance;IADL    Rehab Potential Good    Clinical Decision Making Several  treatment options, min-mod task modification necessary    Comorbidities Affecting Occupational Performance: Presence of comorbidities impacting occupational performance    Modification or Assistance to Complete Evaluation  No modification of tasks or assist necessary to complete eval    OT Frequency 1x / week    OT Duration 4 weeks    OT Treatment/Interventions Self-care/ADL training;Therapeutic exercise;Patient/family education;Energy conservation;Therapeutic activities;DME and/or AE instruction;Cognitive remediation/compensation    Plan Introduce energy conservation during BADLs    Consulted and Agree with Plan of Care Patient;Family member/caregiver    Family Member Consulted Timothy Moran (wife)           Patient will benefit from skilled therapeutic intervention in order to improve the following deficits and impairments:   Body Structure / Function / Physical Skills: ADL,Decreased knowledge of precautions,Balance,Decreased knowledge of use of DME,Body mechanics,Cardiopulmonary status limiting activity,Strength,Pain,Endurance,IADL       Visit Diagnosis: Muscle weakness (generalized) - Plan: Ot plan of care cert/re-cert  Other symptoms and signs involving the musculoskeletal system - Plan: Ot plan of care cert/re-cert    Problem List Patient Active Problem List   Diagnosis Date Noted  . Ischemic cardiomyopathy 10/26/2020  . DM (diabetes mellitus), type 2 ([place]) new 10/26/2020  . Hematuria 10/26/2020  . Urinary retention 10/26/2020  . Iron deficiency anemia rec'd IV iron 10/26/2020  . Anoxic brain injury ([place]) 10/26/2020  . HLD (hyperlipidemia) 10/26/2020  . CAD in native artery residual disease of LAD, may need PCI, treating medically for now 10/26/2020  . S/P angioplasty with stent 10/04/20 DES to LCX  10/26/2020  . Acute on chronic combined systolic and diastolic HF (heart failure) ([place]) 10/26/2020  . Pneumonia of both lungs due to infectious organism   . Acute respiratory  failure ([place]), hypothermia therapy, vent - extubated 10/06/20   . Acute blood loss anemia 10/05/2020  . AKI (acute kidney injury) ([place]) 10/05/2020  . STEMI (ST elevation myocardial infarction) ([place]) 10/04/2020  . Encounter for central line placement   . Encounter for imaging study to confirm orogastric (OG) tube placement   . SOB (shortness of breath)   . Cardiac arrest with ventricular fibrillation ([place])   . Pain of left hip joint 05/29/2020  . Benign brain tumor ([place])   . Chronic kidney disease   . History of pulmonary embolus (PE)   . Hypertension   . Nonsustained ventricular tachycardia ([place]) 10/11/2019  . Dizziness 10/11/2019  . Abnormal echocardiogram 06/07/2019  . Pulmonary hypertension due to thromboembolism ([place]) 03/09/2018  . Solitary pulmonary nodule on lung CT 03/08/2018  . Ascending aortic aneurysm (HCC) 4.2 cm based on CT from December 2020 01/29/2018  . Sinus bradycardia 12/22/2017  . Left bundle branch block 12/19/2017  . Personal history of pulmonary embolism 12/19/2017  . Chronic kidney disease, stage III (moderate) ([place]) 12/19/2017  . Benign neoplasm of brain ([place]) 12/19/2017  . Chest pain 12/04/2017    Kathrine Cords, OTR/L, MSOT 11/12/2020, 9:32 PM  [facility]. [place], [place], [address] Phone: [phone]   Fax:  [phone]  Name: Timothy Moran MRN: [id number] Date of Birth: [birth year]

## 2020-11-17 DIAGNOSIS — R338 Other retention of urine: Secondary | ICD-10-CM | POA: Diagnosis not present

## 2020-11-17 DIAGNOSIS — N401 Enlarged prostate with lower urinary tract symptoms: Secondary | ICD-10-CM | POA: Diagnosis not present

## 2020-11-17 NOTE — Progress Notes (Signed)
Cardiology Office Note:    Date:  11/18/2020   ID:  Timothy Moran Moran, DOB Sep 28, 1945, MRN [id number]  PCP:  Timothy Moran Moran, [place]  Cardiologist:  Timothy Campus, MD   Electrophysiologist:  None       Referring MD: Timothy Caraway, MD   Chief Complaint:  Hospitalization Follow-up (S/p cardiac arrest, STEMI >> PCI; DC to inpt Rehab)    Patient Profile:    Timothy Moran Moran is a 75 y.o. male with:   Coronary artery disease   Inf STEMI in 09/2020 s/p DES to dLCx  pLAD 50, mLAD 80 >>>Med Rx due to AKI; no chest pain   (HFrEF) heart failure with reduced ejection fraction   Echocardiogram 10/21: EF 40-45  Ischemic CM; Echocardiogram 1/22: EF 20-25  OOH VF arrest at time of MI 09/2020>>DC on LifeVest  Hx of recurrent pulmonary embolism (provoked, long travels)  Hypertension   Ascending thoracic aortic aneurysm  LBBB  Hyperlipidemia   Meningioma   Chronic kidney disease   NSVT  Lung nodule   Prior CV studies: Echocardiogram 10/05/20 EF 20-25, diff HK, mild LVH, Gr 1 DD, normal RVSF, mild MR, trivial AI  Cardiac catheterization 10/04/20 LM mild disease LAD prox 50, mid 80 LCx prox 25, dist 100 (thrombotic); OM2 25 RCA mild diffuse dz PCI:  2.5 x 30 mm Resolute onyx DES to dLCx   Chest CTA 09/18/20 TAA 4.2 cm  Aortic atherosclerosis  R lung nodules; stable/benign  Mild CAD   History of Present Illness:    Timothy Moran Moran was last seen by Dr. Agustin Moran in 11/21.  The patient had had a recent event while traveling to [place] that was concerning for an embolism.  He was placed on anticoagulation but preferred to stop it. It was recommended that he complete a total of 3 mos.    He was admitted 1/23-2/17 with an inferior STEMI c/b OOH VF arrest requiring defib x 4, CPR. He achieved ROSC after 16 min.  Cardiac catheterization demonstrated 2 vessel CAD with thrombotic occlusion of the dLCx which was the culprit and sequential 50% and  80% proximal and mid LAD lesions.  He underwent PCI with DES to the LCx.  He was managed with hypothermia protocol.  His hospital course was c/b anoxic brain injury, AKI (peak SCr 8), urinary retention and hematuria.  Initially, it was planned to proceed with staged PCI of the LAD, but this was deferred due to AKI.  His EF was severely depressed on echocardiogram at 20-25%.   He was DC on LifeVest.  He has f/u with EP (Dr. Quentin Moran) in April to decided +/- ICD.  He was dx with new onset diabetes.  He was not placed on ACE/ARB/ARNI/MRA due to reduced renal function.  He was DC to inpt rehab (2/17-2/25).    He returns for f/u.  He is here today with his wife..  Since DC from inpt rehab, he has been doing well.  He feels he is getting stronger.  He has not had chest pain, syncope, orthopnea, leg edema.  He has not had significant shortness of breath.  He is starting outpatient physical therapy soon.  He does walk with a walker and sometimes without a walker.  His appetite is good.  He has not had any shocks or alarms from his LifeVest.      Past Medical History:  Diagnosis Date  . Abnormal echocardiogram 06/07/2019  . Acute on chronic combined systolic and diastolic HF (  heart failure) ([place]) 10/26/2020  . Anoxic brain injury ([place]) 10/26/2020  . Arrhythmia   . Ascending aortic aneurysm (HCC) 4.2 cm based on CT from December 2020 01/29/2018   4.3 cm by echo in 2019  . Benign brain tumor ([place])   . Benign neoplasm of brain ([place]) 12/19/2017  . CAD in native artery residual disease of LAD, may need PCI, treating medically for now 10/26/2020  . Chest pain 12/04/2017  . Chronic kidney disease    Stage 3  . Chronic kidney disease, stage III (moderate) ([place]) 12/19/2017  . Diabetes mellitus, type 2 ([place]) 10/06/2020   10/06/20 A1C 7%  . Dizziness 10/11/2019  . Hematuria 10/26/2020  . History of pulmonary embolus ([facility])   . Hypertension   . Iron deficiency anemia rec'd IV iron 10/26/2020  . Ischemic cardiomyopathy 10/26/2020   . Left bundle branch block 12/19/2017  . Nonsustained ventricular tachycardia ([place]) 10/11/2019  . Pain of left hip joint 05/29/2020  . Personal history of pulmonary embolism 12/19/2017  . Pulmonary hypertension due to thromboembolism ([place]) 03/09/2018   See echo 12/27/17 with nl PAS vs CTa chest 02/23/18   . S/P angioplasty with stent 10/04/20 DES to LCX  10/26/2020  . Sinus bradycardia 12/22/2017  . Solitary pulmonary nodule on lung CT 03/08/2018   CT 12/04/17 1.0 x 0.8 x 0.7 cm nodular opacity in the RUL vs not seen 03/16/10 posterior segment of the right upper lobe. Marland KitchenSpirometry 03/08/2018    FEV1 3.86 (108%)  Ratio 96 s prior rx  - PET  03/13/18   Low grade c/w adenoca > rec T surgery eval    . Urinary retention 10/26/2020    Current Medications: Current Meds  Medication Sig  . acetaminophen (TYLENOL) 325 MG tablet Take 1-2 tablets (325-650 mg total) by mouth every 4 (four) hours as needed for mild pain.  Marland Kitchen aspirin 81 MG chewable tablet Chew 1 tablet (81 mg total) by mouth daily.  . B Complex-C (SUPER B COMPLEX PO) Take 1 tablet by mouth daily.  Marland Kitchen docusate sodium (COLACE) 100 MG capsule Take 2 capsules (200 mg total) by mouth daily.  . melatonin 5 MG TABS Take 1 tablet (5 mg total) by mouth at bedtime.  . Multiple Vitamin (MULTIVITAMIN WITH MINERALS) TABS tablet Take 1 tablet by mouth daily.  Marland Kitchen senna-docusate (SENOKOT-S) 8.6-50 MG tablet Take 1 tablet by mouth at bedtime.  . sodium bicarbonate 650 MG tablet Take 2 tablets (1,300 mg total) by mouth 3 (three) times daily.  . tamsulosin (FLOMAX) 0.4 MG CAPS capsule Take 1 capsule (0.4 mg total) by mouth daily.  . traZODone (DESYREL) 50 MG tablet Take 1 tablet (50 mg total) by mouth at bedtime.  . [DISCONTINUED] atorvastatin (LIPITOR) 80 MG tablet Take 1 tablet (80 mg total) by mouth daily.  . [DISCONTINUED] hydrALAZINE (APRESOLINE) 25 MG tablet Take 1 tablet (25 mg total) by mouth every 8 (eight) hours.  . [DISCONTINUED] isosorbide dinitrate (ISORDIL) 30 MG  tablet Take 1 tablet (30 mg total) by mouth 2 (two) times daily.  . [DISCONTINUED] metoprolol succinate (TOPROL-XL) 50 MG 24 hr tablet Take 1 tablet (50 mg total) by mouth daily. Take with or immediately following a meal.  . [DISCONTINUED] nitroGLYCERIN (NITROSTAT) 0.4 MG SL tablet Place 1 tablet (0.4 mg total) under the tongue every 5 (five) minutes as needed for chest pain.  . [DISCONTINUED] ticagrelor (BRILINTA) 90 MG TABS tablet Take 1 tablet (90 mg total) by mouth 2 (two) times daily.  Allergies:   Patient has no known allergies.   Social History   Tobacco Use  . Smoking status: Former Smoker    Packs/day: 0.50    Years: 5.00    Pack years: 2.50    Types: Cigarettes    Quit date: 09/12/1968    Years since quitting: 52.2  . Smokeless tobacco: Never Used  Vaping Use  . Vaping Use: Never used  Substance Use Topics  . Alcohol use: Yes    Comment: ONE PER WEEK  . Drug use: Never     Family Hx: The patient's family history includes Brain cancer in his sister; Diabetes in his mother; Leukemia in his brother; Melanoma in his brother.  ROS   EKGs/Labs/Other Test Reviewed:    EKG:  EKG is   ordered today.  The ekg ordered today demonstrates normal sinus rhythm, heart rate 67, left bundle branch block, first-degree block, PR 210, QTC 477  Recent Labs: 10/06/2020: TSH 3.917 10/08/2020: B Natriuretic Peptide 1,434.0 10/12/2020: Magnesium 2.2 10/30/2020: ALT 50 11/02/2020: BUN 28; Creatinine, Ser 2.74; Hemoglobin 9.0; Platelets 284; Potassium 3.6; Sodium 138   Recent Lipid Panel Lab Results  Component Value Date/Time   CHOL 114 10/06/2020 03:17 AM   CHOL 247 (H) 06/01/2020 08:58 AM   TRIG 163 (H) 10/06/2020 03:17 AM   HDL 39 (L) 10/06/2020 03:17 AM   HDL 33 (L) 06/01/2020 08:58 AM   CHOLHDL 2.9 10/06/2020 03:17 AM   LDLCALC 42 10/06/2020 03:17 AM   LDLCALC 151 (H) 06/01/2020 08:58 AM   Estimated Creatinine Clearance: 27.8 mL/min (A) (by C-G formula based on SCr of 2.74 mg/dL  (H)).    Risk Assessment/Calculations:      Physical Exam:    VS:  BP 110/60   Pulse 71   Ht 6\' 1"  (1.854 m)   Wt 215 lb 6.4 oz (97.7 kg)   SpO2 95%   BMI 28.42 kg/m     Wt Readings from Last 3 Encounters:  11/18/20 215 lb 6.4 oz (97.7 kg)  11/06/20 213 lb 6.5 oz (96.8 kg)  10/29/20 205 lb 14.6 oz (93.4 kg)     Constitutional:      Appearance: Healthy appearance. Not in distress.  Neck:     Vascular: No JVR. JVD normal.  Pulmonary:     Effort: Pulmonary effort is normal.     Breath sounds: No wheezing. No rales.  Cardiovascular:     Normal rate. Regular rhythm. Normal S1. Normal S2.     Murmurs: There is no murmur.  Edema:    Peripheral edema absent.  Abdominal:     Palpations: Abdomen is soft. There is no hepatomegaly.  Skin:    General: Skin is warm and dry.  Neurological:     General: No focal deficit present.     Mental Status: Alert and oriented to person, place and time.     Cranial Nerves: Cranial nerves are intact.       ASSESSMENT & PLAN:    1. HFrEF (heart failure with reduced ejection fraction) (HCC) Ischemic cardiomyopathy.  EF 20-25.  Volume status appears stable.  He is not on diuretic therapy.  He is not on ACE inhibitor/ARB/MRA/ARNI due to chronic kidney disease and AKI.  Continue current dose of hydralazine, isosorbide dinitrate, metoprolol succinate.  GFR is 27.  As his renal function improves, we can consider adding dapagliflozin.  We discussed the importance of daily weights.  We also discussed the importance of limiting salt.  Follow-up with  Dr. Agustin Moran in 4 to 6 weeks.  2. Coronary artery disease involving native coronary artery of native heart without angina pectoris Status post recent cardiac arrest.  Cardiac catheterization demonstrated severe two-vessel CAD.  He did undergo DES placement to the LCx as this was the culprit vessel.  He has residual mid LAD 80% stenosis.  Initial plan was to proceed with staged PCI.  However, this was  deferred due to worsening renal function.  He is currently doing well without anginal symptoms.  Continue current therapy with aspirin, atorvastatin, isosorbide, metoprolol succinate, ticagrelor.  We discussed the importance of uninterrupted dual antiplatelet therapy for the next 12 months.  He is not in cardiac rehab at this time but is pursuing outpatient physical therapy.  3. Thoracic aortic aneurysm without rupture (HCC) 4.2 cm by recent CT.  4. Essential hypertension The patient's blood pressure is controlled on his current regimen.  Continue current therapy.   5. Stage 3b chronic kidney disease ([place]) Most recent creatinine 2.74.  Obtain follow-up BMET today.  6. Mixed hyperlipidemia Recent LDL 42.  Continue high intensity statin therapy    Dispo:  Return in about 4 weeks (around 12/16/2020) for Routine Follow Up w/ Dr. Agustin Moran.   Medication Adjustments/Labs and Tests Ordered: Current medicines are reviewed at length with the patient today.  Concerns regarding medicines are outlined above.  Tests Ordered: Orders Placed This Encounter  Procedures  . Basic Metabolic Panel (BMET)  . EKG 12-Lead   Medication Changes: Meds ordered this encounter  Medications  . atorvastatin (LIPITOR) 80 MG tablet    Sig: Take 1 tablet (80 mg total) by mouth daily.    Dispense:  90 tablet    Refill:  3  . ticagrelor (BRILINTA) 90 MG TABS tablet    Sig: Take 1 tablet (90 mg total) by mouth 2 (two) times daily.    Dispense:  180 tablet    Refill:  3  . hydrALAZINE (APRESOLINE) 25 MG tablet    Sig: Take 1 tablet (25 mg total) by mouth every 8 (eight) hours.    Dispense:  252 tablet    Refill:  3  . isosorbide dinitrate (ISORDIL) 30 MG tablet    Sig: Take 1 tablet (30 mg total) by mouth 2 (two) times daily.    Dispense:  180 tablet    Refill:  3  . metoprolol succinate (TOPROL-XL) 50 MG 24 hr tablet    Sig: Take 1 tablet (50 mg total) by mouth daily. Take with or immediately following a meal.     Dispense:  90 tablet    Refill:  3  . nitroGLYCERIN (NITROSTAT) 0.4 MG SL tablet    Sig: Place 1 tablet (0.4 mg total) under the tongue every 5 (five) minutes as needed for chest pain.    Dispense:  25 tablet    Refill:  3    Signed, Richardson Dopp, PA-C  11/18/2020 9:25 AM    [address], [place], [place]  [address] Phone: [phone]; Fax: [phone]

## 2020-11-18 ENCOUNTER — Ambulatory Visit: Payer: Medicare Other | Admitting: Occupational Therapy

## 2020-11-18 ENCOUNTER — Ambulatory Visit: Payer: Medicare Other

## 2020-11-18 ENCOUNTER — Encounter: Payer: Self-pay | Admitting: Occupational Therapy

## 2020-11-18 ENCOUNTER — Encounter: Payer: Self-pay | Admitting: Physician Assistant

## 2020-11-18 ENCOUNTER — Ambulatory Visit (INDEPENDENT_AMBULATORY_CARE_PROVIDER_SITE_OTHER): Payer: Medicare Other | Admitting: Physician Assistant

## 2020-11-18 ENCOUNTER — Other Ambulatory Visit: Payer: Self-pay

## 2020-11-18 VITALS — BP 110/60 | HR 71 | Ht 73.0 in | Wt 215.4 lb

## 2020-11-18 VITALS — BP 101/62 | HR 62

## 2020-11-18 DIAGNOSIS — G931 Anoxic brain damage, not elsewhere classified: Secondary | ICD-10-CM | POA: Diagnosis not present

## 2020-11-18 DIAGNOSIS — I10 Essential (primary) hypertension: Secondary | ICD-10-CM | POA: Diagnosis not present

## 2020-11-18 DIAGNOSIS — I5020 Unspecified systolic (congestive) heart failure: Secondary | ICD-10-CM | POA: Diagnosis not present

## 2020-11-18 DIAGNOSIS — E782 Mixed hyperlipidemia: Secondary | ICD-10-CM

## 2020-11-18 DIAGNOSIS — M25652 Stiffness of left hip, not elsewhere classified: Secondary | ICD-10-CM

## 2020-11-18 DIAGNOSIS — I2510 Atherosclerotic heart disease of native coronary artery without angina pectoris: Secondary | ICD-10-CM

## 2020-11-18 DIAGNOSIS — I255 Ischemic cardiomyopathy: Secondary | ICD-10-CM

## 2020-11-18 DIAGNOSIS — M25552 Pain in left hip: Secondary | ICD-10-CM

## 2020-11-18 DIAGNOSIS — N1832 Chronic kidney disease, stage 3b: Secondary | ICD-10-CM | POA: Diagnosis not present

## 2020-11-18 DIAGNOSIS — R2689 Other abnormalities of gait and mobility: Secondary | ICD-10-CM

## 2020-11-18 DIAGNOSIS — M6281 Muscle weakness (generalized): Secondary | ICD-10-CM

## 2020-11-18 DIAGNOSIS — R29898 Other symptoms and signs involving the musculoskeletal system: Secondary | ICD-10-CM

## 2020-11-18 DIAGNOSIS — I712 Thoracic aortic aneurysm, without rupture, unspecified: Secondary | ICD-10-CM

## 2020-11-18 LAB — BASIC METABOLIC PANEL
BUN/Creatinine Ratio: 14 (ref 10–24)
BUN: 27 mg/dL (ref 8–27)
CO2: 19 mmol/L — ABNORMAL LOW (ref 20–29)
Calcium: 9.2 mg/dL (ref 8.6–10.2)
Chloride: 103 mmol/L (ref 96–106)
Creatinine, Ser: 1.95 mg/dL — ABNORMAL HIGH (ref 0.76–1.27)
Glucose: 111 mg/dL — ABNORMAL HIGH (ref 65–99)
Potassium: 4.8 mmol/L (ref 3.5–5.2)
Sodium: 139 mmol/L (ref 134–144)
eGFR: 35 mL/min/{1.73_m2} — ABNORMAL LOW (ref >59)

## 2020-11-18 MED ORDER — TICAGRELOR 90 MG PO TABS: 90.0000 mg | ORAL_TABLET | Freq: Two times a day (BID) | ORAL | 3 refills | Status: DC

## 2020-11-18 MED ORDER — NITROGLYCERIN 0.4 MG SL SUBL: 0.4000 mg | SUBLINGUAL_TABLET | SUBLINGUAL | 3 refills | Status: DC | PRN

## 2020-11-18 MED ORDER — ISOSORBIDE DINITRATE 30 MG PO TABS: 30.0000 mg | ORAL_TABLET | Freq: Two times a day (BID) | ORAL | 3 refills | Status: DC

## 2020-11-18 MED ORDER — ATORVASTATIN CALCIUM 80 MG PO TABS: 80.0000 mg | ORAL_TABLET | Freq: Every day | ORAL | 3 refills | Status: DC

## 2020-11-18 MED ORDER — HYDRALAZINE HCL 25 MG PO TABS: 25.0000 mg | ORAL_TABLET | Freq: Three times a day (TID) | ORAL | 3 refills | Status: DC

## 2020-11-18 MED ORDER — METOPROLOL SUCCINATE ER 50 MG PO TB24: 50.0000 mg | ORAL_TABLET | Freq: Every day | ORAL | 3 refills | Status: DC

## 2020-11-18 NOTE — Patient Instructions (Signed)
Medication Instructions:  Your physician recommends that you continue on your current medications as directed. Please refer to the Current Medication list given to you today.  1. All refills sent in today for #90 to mail order.    *If you need a refill on your cardiac medications before your next appointment, please call your pharmacy*   Lab Work: Your physician recommends that you have lab work today: BMET  If you have labs (blood work) drawn today and your tests are completely normal, you will receive your results only by: Marland Kitchen MyChart Message (if you have MyChart) OR . A paper copy in the mail If you have any lab test that is abnormal or we need to change your treatment, we will call you to review the results.   Testing/Procedures: -None   Follow-Up: At [facility], you and your health needs are our priority.  As part of our continuing mission to provide you with exceptional heart care, we have created designated Provider Care Teams.  These Care Teams include your primary Cardiologist (physician) and Advanced Practice Providers (APPs -  Physician Assistants and Nurse Practitioners) who all work together to provide you with the care you need, when you need it.  We recommend signing up for the patient portal called "MyChart".  Sign up information is provided on this After Visit Summary.  MyChart is used to connect with patients for Virtual Visits (Telemedicine).  Patients are able to view lab/test results, encounter notes, upcoming appointments, etc.  Non-urgent messages can be sent to your provider as well.   To learn more about what you can do with MyChart, go to [email or web address].    Your next appointment:   6 week(s)  The format for your next appointment:   In Person  Provider:   Jenne Campus, MD   Other Instructions

## 2020-11-18 NOTE — Patient Instructions (Signed)
Access code:

## 2020-11-18 NOTE — Therapy (Signed)
[place] [place], [place], [address] Phone: [phone]   Fax:  [phone]  Physical Therapy Evaluation  Patient Details  Name: Timothy Moran MRN: [id number] Date of Birth: [birth year] Referring Provider (PT): Reesa Chew PA-C   Encounter Date: 11/18/2020   PT End of Session - 11/18/20 2215    Visit Number 1    Number of Visits 17    Date for PT Re-Evaluation 01/13/21    Authorization Type Medicare    PT Start Time 1615    PT Stop Time 1700    PT Time Calculation (min) 45 min    Activity Tolerance Patient tolerated treatment well    Behavior During Therapy [facility] for tasks assessed/performed           Past Medical History:  Diagnosis Date  . Abnormal echocardiogram 06/07/2019  . Acute on chronic combined systolic and diastolic HF (heart failure) ([place]) 10/26/2020  . Anoxic brain injury ([place]) 10/26/2020  . Arrhythmia   . Ascending aortic aneurysm (HCC) 4.2 cm based on CT from December 2020 01/29/2018   4.3 cm by echo in 2019  . Benign brain tumor ([place])   . Benign neoplasm of brain ([place]) 12/19/2017  . CAD in native artery residual disease of LAD, may need PCI, treating medically for now 10/26/2020  . Chest pain 12/04/2017  . Chronic kidney disease    Stage 3  . Chronic kidney disease, stage III (moderate) ([place]) 12/19/2017  . Diabetes mellitus, type 2 ([place]) 10/06/2020   10/06/20 A1C 7%  . Dizziness 10/11/2019  . Hematuria 10/26/2020  . History of pulmonary embolus (PE)   . Hypertension   . Iron deficiency anemia rec'd IV iron 10/26/2020  . Ischemic cardiomyopathy 10/26/2020  . Left bundle branch block 12/19/2017  . Nonsustained ventricular tachycardia ([place]) 10/11/2019  . Pain of left hip joint 05/29/2020  . Personal history of pulmonary embolism 12/19/2017  . Pulmonary hypertension due to thromboembolism ([place]) 03/09/2018   See echo 12/27/17 with nl PAS vs CTa chest 02/23/18   . S/P angioplasty with stent 10/04/20 DES to  LCX  10/26/2020  . Sinus bradycardia 12/22/2017  . Solitary pulmonary nodule on lung CT 03/08/2018   CT 12/04/17 1.0 x 0.8 x 0.7 cm nodular opacity in the RUL vs not seen 03/16/10 posterior segment of the right upper lobe. Marland KitchenSpirometry 03/08/2018    FEV1 3.86 (108%)  Ratio 96 s prior rx  - PET  03/13/18   Low grade c/w adenoca > rec T surgery eval    . Urinary retention 10/26/2020    Past Surgical History:  Procedure Laterality Date  . APPENDECTOMY    . CORONARY/GRAFT ACUTE MI REVASCULARIZATION N/A 10/04/2020   Procedure: Coronary/Graft Acute MI Revascularization;  Surgeon: Nelva Bush, MD;  Location: [place] [facility];  Service: Cardiovascular;  Laterality: N/A;  . HERNIA REPAIR    . LEFT HEART CATH AND CORONARY ANGIOGRAPHY N/A 10/04/2020   Procedure: LEFT HEART CATH AND CORONARY ANGIOGRAPHY;  Surgeon: Nelva Bush, MD;  Location: [place] [facility];  Service: Cardiovascular;  Laterality: N/A;    Vitals:   11/18/20 2249 11/18/20 2251 11/18/20 2252  BP: (!) 85/60 91/60 101/62  Pulse: (!) 57 61 62  SpO2: 99% 98% 98%      Subjective Assessment - 11/18/20 1617    Subjective Cardiac arrest 6 weeks ago 10/04/2020 with anoxic brain injury. Pt reports he was shoveling snow when he noticed chest pains; his wife called  EMS and while on the phone she reports he collapsed and was not responsive for approx 3 minutes before EMS arrived and initiated CPR. According to cardio ICU H&P, "he received a total of 4 defibrillations by fire and EMS with initial rhythm of VF on EMS arrival and ROSC at 16 minutes." Pt reports he does not remember the 2 weeks or so that he spent in the hospital and that, according to medical personnel and his wife, he experienced significant confusion and agitation. On the LifeVest all the time other then when showering ( will beep when off or not on properly).    Patient is accompained by: Family member   Timothy Moran, wife   Pertinent History HTN, arrhythmia, hx of pulmonary embolus,  meningiomas, stage III CKD, R upper and lower lobe lung nodules, ascending aortic aneurysm, and L bundle branch block    Patient Stated Goals Getting back to normal - doing activities around the house, yard work. Improve endurance strength and balance.    Currently in Pain? No/denies              [facility] PT Assessment - 11/18/20 1619      Assessment   Medical Diagnosis Anoxic brain injury 2/2 cardiac arrest    Referring Provider (PT) Reesa Chew PA-C    Onset Date/Surgical Date 10/04/20    Next MD Visit 11/18/20    Prior Therapy Yes      Precautions   Precaution Comments "No driving or strenuous activity till cleared by MD. Needs to wear LifeVest at all times--may remove to shower with supervision" (per inpatient rehab d/c summary)      Balance Screen   Has the patient fallen in the past 6 months No    Has the patient had a decrease in activity level because of a fear of falling?  No    Is the patient reluctant to leave their home because of a fear of falling?  No      Home Environment   Additional Comments House with wife. House has 2 level but living on the first level. 6 steps to get into house doing good managing the stairs with handrail on the RIGHT      Prior Function   Level of Independence Independent    Vocation Retired   Probation officer, Arts administrator games w/ friends, spending time w/ family, working in the yard      Cognition   Overall Cognitive Status Within Functional Limits for tasks assessed      Coordination   Gross Motor Movements are Fluid and Coordinated Yes      ROM / Strength   AROM / PROM / Strength Strength      Strength   Overall Strength Comments Gross BLE strength 3+/5      Ambulation/Gait   Gait velocity 0.9 m/s with FWW    Stairs Yes    Stairs Assistance 6: Modified independent (Device/Increase time)    Stair Management Technique One rail Right;Alternating pattern;Forwards   decreased eccentric control, some SOB with increased repetitions. 4  and 6 inch   Gait Comments 3 MWT: RPE/Borg dyspnea ranged 3-5/10 by end of 3rd minute. 532 feet total = 51 m   with RW                     Objective measurements completed on examination: See above findings.       [facility] Adult PT Treatment/Exercise - 11/18/20 1619  Exercises   Exercises Lumbar;Knee/Hip      Lumbar Exercises: Seated   Long Arc Quad on Chair Both;AROM;3 sets;5 reps    Other Seated Lumbar Exercises Seated ankle pumps 10 x 2. Seated march      Knee/Hip Exercises: Standing   Other Standing Knee Exercises Standing marches B 5 x 3  with BUE support on walker                  PT Education - 11/18/20 2252    Education Details PT POC and initial HEP. Access Code: [email or web address]  URL: [email or web address]      Exercises  Seated Heel Toe Raises - 1 x daily - 5 x weekly - 2 sets - 10 reps  Seated Long Arc Quad - 1 x daily - 5 x weekly - 2 sets - 10 reps  Sit to Stand with Counter Support - 1 x daily - 7 x weekly - 3 sets - 5 reps  Standing March with Counter Support - 1 x daily - 7 x weekly - 3 sets - 5 reps. Also discussed VS monitoring at home more regularly as well as assessing effort with exercises, pacing as needed for energy conservaiton. AHA blood pressure log provided for home    Person(s) Educated Patient;Spouse   WIfe Timothy Moran   Methods Explanation;Demonstration;Handout    Comprehension Verbalized understanding;Returned demonstration            PT Short Term Goals - 11/18/20 2242      PT SHORT TERM GOAL #1   Title Pt will be I and compliant with initial HEP.    Time 2    Period Weeks    Status New    Target Date 12/02/20             PT Long Term Goals - 11/18/20 2242      PT LONG TERM GOAL #1   Title Pt will return to walking for 45 minutes as daily exercise not limited by shortness of breath    Time 8    Period Weeks    Status New    Target Date 01/13/21      PT LONG TERM GOAL #2   Title BLE strength increased to  grossly 4+/5    Time 8    Status New    Target Date 01/13/21      PT LONG TERM GOAL #3   Title Pt will be able to complete 6 MWT distance of at least 500 m with no greater than 4/10 RPE or Borg Dypsnea scale rating    Time 8    Period Weeks    Status New    Target Date 01/13/21      PT LONG TERM GOAL #4   Title Pt will score at least 7 points higher than initial berg score to demo decreased falls risk    Time 8    Period Weeks    Status New    Target Date 01/13/21                  Plan - 11/18/20 2216    Clinical Impression Statement Pt is a 75 yo male who presents to physical therapy evaluation post anoxic brain injury with cardiac arrest 6 weeks ago (10/04/2020). Pt is currently wears LifeVest wearable defibrillator at all times other than bathing. He presents with decreased overall aerobic capacity and endurance as evidenced by distance traveled on 3MWT which was 76 m (6MWT norm for age  is 51 m). He also demonstrates grossly decreased BLE strength and balance with dependence on UE  support with mobility using HR on stairs or FWW with walking.  RPE/Dyspnea and VS monitored throughout session and BP was on the lower side to begin although asymptomatic. BP improved with some exercises in sitting before attempting standing tests and mobility. Pt reported he gets some lightheadedness at times especially when going sit <> stand. Pt and wife educated on BP monitoring to get a better idea of daily baseline as they are not currently doing so, [name] would benefit from skilled PT at this time to work on improving strength, cardiovascular and muscular endurance, balance with progressing to LRAD/independence, and gait.    Personal Factors and Comorbidities Age;Time since onset of injury/illness/exacerbation;Comorbidity 3+;Fitness;Past/Current Experience    Stability/Clinical Decision Making Evolving/Moderate complexity    Clinical Decision Making Moderate    Rehab Potential Good    PT  Frequency 2x / week    PT Duration 8 weeks    PT Treatment/Interventions ADLs/Self Care Home Management;Neuromuscular re-education;Balance training;Therapeutic exercise;Therapeutic activities;Functional mobility training;Patient/family education;Stair training;Manual techniques;Energy conservation;Taping;Vestibular    PT Next Visit Plan Reassess HEP. Complete Berg next visit. Monitor VS  as needed. Slowly progress endurance strength and balance as tolerated. Monitor dyspnea/SOB on exertion and utilize RPE to grade activity    PT Home Exercise Plan see pt edu    Consulted and Agree with Plan of Care Patient;Family member/caregiver    Family Member Consulted Wife, Timothy Moran           Patient will benefit from skilled therapeutic intervention in order to improve the following deficits and impairments:  Abnormal gait,Decreased strength,Improper body mechanics,Postural dysfunction,Difficulty walking,Cardiopulmonary status limiting activity,Decreased activity tolerance,Decreased balance,Pain,Decreased endurance  Visit Diagnosis: Muscle weakness (generalized) - Plan: PT plan of care cert/re-cert  Anoxic brain injury ([place]) - Plan: PT plan of care cert/re-cert  Pain in left hip - Plan: PT plan of care cert/re-cert  Stiffness of left hip, not elsewhere classified - Plan: PT plan of care cert/re-cert  Other abnormalities of gait and mobility - Plan: PT plan of care cert/re-cert     Problem List Patient Active Problem List   Diagnosis Date Noted  . Ischemic cardiomyopathy 10/26/2020  . DM (diabetes mellitus), type 2 ([place]) new 10/26/2020  . Hematuria 10/26/2020  . Urinary retention 10/26/2020  . Iron deficiency anemia rec'd IV iron 10/26/2020  . Anoxic brain injury ([place]) 10/26/2020  . HLD (hyperlipidemia) 10/26/2020  . CAD in native artery residual disease of LAD, may need PCI, treating medically for now 10/26/2020  . S/P angioplasty with stent 10/04/20 DES to LCX  10/26/2020  . Acute on  chronic combined systolic and diastolic HF (heart failure) ([place]) 10/26/2020  . Pneumonia of both lungs due to infectious organism   . Acute respiratory failure ([place]), hypothermia therapy, vent - extubated 10/06/20   . Acute blood loss anemia 10/05/2020  . AKI (acute kidney injury) ([place]) 10/05/2020  . STEMI (ST elevation myocardial infarction) ([place]) 10/04/2020  . Encounter for central line placement   . Encounter for imaging study to confirm orogastric (OG) tube placement   . SOB (shortness of breath)   . Cardiac arrest with ventricular fibrillation ([place])   . Pain of left hip joint 05/29/2020  . Benign brain tumor ([place])   . Chronic kidney disease   . History of pulmonary embolus (PE)   . Hypertension   . Nonsustained ventricular tachycardia ([place]) 10/11/2019  . Dizziness 10/11/2019  . Abnormal  echocardiogram 06/07/2019  . Pulmonary hypertension due to thromboembolism ([place]) 03/09/2018  . Solitary pulmonary nodule on lung CT 03/08/2018  . Ascending aortic aneurysm (HCC) 4.2 cm based on CT from December 2020 01/29/2018  . Sinus bradycardia 12/22/2017  . Left bundle branch block 12/19/2017  . Personal history of pulmonary embolism 12/19/2017  . Chronic kidney disease, stage III (moderate) ([place]) 12/19/2017  . Benign neoplasm of brain ([place]) 12/19/2017  . Chest pain 12/04/2017    Hall Busing, PT, DPT 11/19/2020, 12:08 PM  [address] [place], [place], [address] Phone: [phone]   Fax:  [phone]  Name: Timothy Moran MRN: [id number] Date of Birth: 10-27-1945

## 2020-11-19 NOTE — Patient Instructions (Signed)
Shoulder Forward Flexion to 90     With arms at sides, thumbs forward, lift both arms forward to chest level. Repeat __2__ sets of __10__ repetitions each session. Do __2__ sessions per day.   SHOULDER: Abduction Unilateral     Raise arm out and up palm up. Keep elbow straight. Do not shrug shoulders. __10__ reps per set, __2__ sets per session, __2__ sessions per day.

## 2020-11-19 NOTE — Therapy (Signed)
[address]. [place], [place], [address] Phone: [phone]   Fax:  [phone]  Occupational Therapy Treatment  Patient Details  Name: Timothy Moran MRN: [id number] Date of Birth: [birth year] Referring Provider (OT): Reesa Chew, [place]   Encounter Date: 11/18/2020   OT End of Session - 11/18/20 1535    Visit Number 2    Number of Visits 5    Date for OT Re-Evaluation 12/25/20    Authorization Type Medicare    OT Start Time 4944    OT Stop Time 1610   Ended session early due to pt fatigue   OT Time Calculation (min) 40 min    Activity Tolerance Patient tolerated treatment well;Other (comment)   Patient limited by cardiopulmonary status   Behavior During Therapy [facility] for tasks assessed/performed           Past Medical History:  Diagnosis Date  . Abnormal echocardiogram 06/07/2019  . Acute on chronic combined systolic and diastolic HF (heart failure) ([place]) 10/26/2020  . Anoxic brain injury ([place]) 10/26/2020  . Arrhythmia   . Ascending aortic aneurysm (HCC) 4.2 cm based on CT from December 2020 01/29/2018   4.3 cm by echo in 2019  . Benign brain tumor ([place])   . Benign neoplasm of brain ([place]) 12/19/2017  . CAD in native artery residual disease of LAD, may need PCI, treating medically for now 10/26/2020  . Chest pain 12/04/2017  . Chronic kidney disease    Stage 3  . Chronic kidney disease, stage III (moderate) ([place]) 12/19/2017  . Diabetes mellitus, type 2 ([place]) 10/06/2020   10/06/20 A1C 7%  . Dizziness 10/11/2019  . Hematuria 10/26/2020  . History of pulmonary embolus (PE)   . Hypertension   . Iron deficiency anemia rec'd IV iron 10/26/2020  . Ischemic cardiomyopathy 10/26/2020  . Left bundle branch block 12/19/2017  . Nonsustained ventricular tachycardia ([place]) 10/11/2019  . Pain of left hip joint 05/29/2020  . Personal history of pulmonary embolism 12/19/2017  . Pulmonary hypertension due to thromboembolism ([place]) 03/09/2018    See echo 12/27/17 with nl PAS vs CTa chest 02/23/18   . S/P angioplasty with stent 10/04/20 DES to LCX  10/26/2020  . Sinus bradycardia 12/22/2017  . Solitary pulmonary nodule on lung CT 03/08/2018   CT 12/04/17 1.0 x 0.8 x 0.7 cm nodular opacity in the RUL vs not seen 03/16/10 posterior segment of the right upper lobe. Marland KitchenSpirometry 03/08/2018    FEV1 3.86 (108%)  Ratio 96 s prior rx  - PET  03/13/18   Low grade c/w adenoca > rec T surgery eval    . Urinary retention 10/26/2020    Past Surgical History:  Procedure Laterality Date  . APPENDECTOMY    . CORONARY/GRAFT ACUTE MI REVASCULARIZATION N/A 10/04/2020   Procedure: Coronary/Graft Acute MI Revascularization;  Surgeon: Nelva Bush, MD;  Location: [place] [facility];  Service: Cardiovascular;  Laterality: N/A;  . HERNIA REPAIR    . LEFT HEART CATH AND CORONARY ANGIOGRAPHY N/A 10/04/2020   Procedure: LEFT HEART CATH AND CORONARY ANGIOGRAPHY;  Surgeon: Nelva Bush, MD;  Location: [place] [facility];  Service: Cardiovascular;  Laterality: N/A;    There were no vitals filed for this visit.   Subjective Assessment - 11/18/20 1535    Subjective  Pt reports feeling like he is continuing to slowly improve. He also had a follow-up appt w/ cardiology this morning and states that appt went well.  Patient is accompanied by: Family member   Delcie Roch (wife)   Pertinent History HTN, arrhythmia, hx of pulmonary embolus, meningiomas, stage III CKD, R upper and lower lobe lung nodules, ascending aortic aneurysm, and L bundle branch block    Patient Stated Goals Improve balance and endurance; "get back to the things I was doing before this happened"    Currently in Pain? No/denies            OT Treatments/Exercises (OP) - 11/18/20 1535      ADLs   ADL Education Given Yes    Compensatory Strategies OT reviewed energy conservation strategies and discussed carryover to ADL/IADLs at home   see 'OT Education' section for details     Exercises   Exercises  Shoulder;Elbow      Shoulder Exercises: Seated   Flexion AROM;Both;10 reps;Limitations   2 sets of 10 reps w/ rest break in between due to dyspnea on exertion; OT instructed pt to include exercise in HEP, only going to 90 degrees of flexion and to complete repetitions slowly   Abduction AROM;Both;10 reps;Limitations   2 sets of 10 reps w/ rest break in between due to dyspnea on exertion; OT instructed pt to include exercise in HEP, only going to 90 degrees of abduction and to complete repetitions slowly     Elbow Exercises   Other elbow exercises Bilateral bicep curls w/out added resistance completed in seated position; 1 set of 25 reps            OT Education - 11/18/20 1530    Education Details OT provided education on energy conservation strategies (planning, pacing, prioritizing, positioning), as well as on metabolic equivalents (METs); OT reviewed how these strategies can apply/be carried over to home and discussed examples of how to incorporate strategies into daily activities. OT also reviewed all goals w/ pt and his spouse and discussed POC for occupational therapy.    Person(s) Educated Patient;Spouse    Methods Explanation;Handout    Comprehension Verbalized understanding            OT Short Term Goals - 11/18/20 1545      OT SHORT TERM GOAL #1   Title Pt will be able to independently identify at least two energy conservation strategies to implement at home to improve safety w/ ADLs    Baseline Reports getting fatigued quickly during ADLs    Time 3    Period Weeks    Status On-going    Target Date 12/02/20      OT SHORT TERM GOAL #2   Title Pt will demonstrate safe transfer in/out of shower w/ Mod I    Baseline Currently standby assist for shower transfer    Time 3    Period Weeks    Status On-going            OT Long Term Goals - 11/18/20 1545      OT LONG TERM GOAL #1   Title Pt will be independent with HEP designed for light BUE strengthening to facilitate  return to leisure activities    Baseline No current HEP due to medical precautions    Time 4    Period Weeks    Status On-going      OT LONG TERM GOAL #2   Title Pt will demonstrate understanding of compensatory and safety strategies, using AE prn, to implement during higher level leisure activities (e.g. yardwork)    Baseline Unable to participate in higher level leisure activities at this time  Time 4    Period Weeks    Status On-going            Plan - 11/18/20 1540    Clinical Impression Statement Pt demonstrated dyspnea on exertion with light BUE AROM exercises, requiring rest breaks between sets and encouragement to completed exercises slowly at home; initial HEP handout provided to pt designed to facilitate light BUE strengthening. OT thoroughly discussed energy conservation strategies and pt verbalized understanding.    OT Occupational Profile and History Detailed Assessment- Review of Records and additional review of physical, cognitive, psychosocial history related to current functional performance    Occupational performance deficits (Please refer to evaluation for details): ADL's;IADL's;Leisure    Body Structure / Function / Physical Skills ADL;Decreased knowledge of precautions;Balance;Decreased knowledge of use of DME;Body mechanics;Cardiopulmonary status limiting activity;Strength;Pain;Endurance;IADL    Rehab Potential Good    Clinical Decision Making Several treatment options, min-mod task modification necessary    Comorbidities Affecting Occupational Performance: Presence of comorbidities impacting occupational performance    Modification or Assistance to Complete Evaluation  No modification of tasks or assist necessary to complete eval    OT Frequency 1x / week    OT Duration 4 weeks    OT Treatment/Interventions Self-care/ADL training;Therapeutic exercise;Patient/family education;Energy conservation;Therapeutic activities;DME and/or AE instruction;Cognitive  remediation/compensation    Plan UE HEP    Consulted and Agree with Plan of Care Patient;Family member/caregiver    Family Member Consulted Delcie Roch (wife)           Patient will benefit from skilled therapeutic intervention in order to improve the following deficits and impairments:   Body Structure / Function / Physical Skills: ADL,Decreased knowledge of precautions,Balance,Decreased knowledge of use of DME,Body mechanics,Cardiopulmonary status limiting activity,Strength,Pain,Endurance,IADL       Visit Diagnosis: Muscle weakness (generalized)  Other symptoms and signs involving the musculoskeletal system    Problem List Patient Active Problem List   Diagnosis Date Noted  . Ischemic cardiomyopathy 10/26/2020  . DM (diabetes mellitus), type 2 ([place]) new 10/26/2020  . Hematuria 10/26/2020  . Urinary retention 10/26/2020  . Iron deficiency anemia rec'd IV iron 10/26/2020  . Anoxic brain injury ([place]) 10/26/2020  . HLD (hyperlipidemia) 10/26/2020  . CAD in native artery residual disease of LAD, may need PCI, treating medically for now 10/26/2020  . S/P angioplasty with stent 10/04/20 DES to LCX  10/26/2020  . Acute on chronic combined systolic and diastolic HF (heart failure) ([place]) 10/26/2020  . Pneumonia of both lungs due to infectious organism   . Acute respiratory failure ([place]), hypothermia therapy, vent - extubated 10/06/20   . Acute blood loss anemia 10/05/2020  . AKI (acute kidney injury) ([place]) 10/05/2020  . STEMI (ST elevation myocardial infarction) ([place]) 10/04/2020  . Encounter for central line placement   . Encounter for imaging study to confirm orogastric (OG) tube placement   . SOB (shortness of breath)   . Cardiac arrest with ventricular fibrillation ([place])   . Pain of left hip joint 05/29/2020  . Benign brain tumor ([place])   . Chronic kidney disease   . History of pulmonary embolus (PE)   . Hypertension   . Nonsustained ventricular tachycardia ([place]) 10/11/2019  .  Dizziness 10/11/2019  . Abnormal echocardiogram 06/07/2019  . Pulmonary hypertension due to thromboembolism ([place]) 03/09/2018  . Solitary pulmonary nodule on lung CT 03/08/2018  . Ascending aortic aneurysm (HCC) 4.2 cm based on CT from December 2020 01/29/2018  . Sinus bradycardia 12/22/2017  . Left bundle branch block 12/19/2017  .  Personal history of pulmonary embolism 12/19/2017  . Chronic kidney disease, stage III (moderate) ([place]) 12/19/2017  . Benign neoplasm of brain ([place]) 12/19/2017  . Chest pain 12/04/2017     Kathrine Cords, OTR/L, MSOT 11/19/2020, 1:28 PM  [address]. [place], [place], [address] Phone: [phone]   Fax:  [phone]  Name: Timothy Moran MRN: [id number] Date of Birth: 10-01-1945

## 2020-11-24 ENCOUNTER — Ambulatory Visit: Payer: Medicare Other | Admitting: Occupational Therapy

## 2020-11-24 ENCOUNTER — Encounter: Payer: Self-pay | Admitting: Occupational Therapy

## 2020-11-24 ENCOUNTER — Ambulatory Visit: Payer: Medicare Other

## 2020-11-24 ENCOUNTER — Other Ambulatory Visit: Payer: Self-pay

## 2020-11-24 DIAGNOSIS — M6281 Muscle weakness (generalized): Secondary | ICD-10-CM | POA: Diagnosis not present

## 2020-11-24 DIAGNOSIS — M25552 Pain in left hip: Secondary | ICD-10-CM

## 2020-11-24 DIAGNOSIS — R29898 Other symptoms and signs involving the musculoskeletal system: Secondary | ICD-10-CM

## 2020-11-24 DIAGNOSIS — G931 Anoxic brain damage, not elsewhere classified: Secondary | ICD-10-CM | POA: Diagnosis not present

## 2020-11-24 DIAGNOSIS — R2689 Other abnormalities of gait and mobility: Secondary | ICD-10-CM | POA: Diagnosis not present

## 2020-11-24 DIAGNOSIS — M25652 Stiffness of left hip, not elsewhere classified: Secondary | ICD-10-CM

## 2020-11-24 NOTE — Therapy (Signed)
[place]. [place], [place], [address] Phone: [phone]   Fax:  [phone]  Physical Therapy Treatment  Patient Details  Name: Timothy Moran MRN: [id number] Date of Birth: [birth year] Referring Provider (PT): Reesa Chew PA-C   Encounter Date: 11/24/2020   PT End of Session - 11/24/20 1044    Visit Number 2    Number of Visits 17    Date for PT Re-Evaluation 01/13/21    Authorization Type Medicare    PT Start Time 0845    PT Stop Time 0930    PT Time Calculation (min) 45 min    Equipment Utilized During Treatment Gait belt    Activity Tolerance Patient tolerated treatment well    Behavior During Therapy [facility] for tasks assessed/performed           Past Medical History:  Diagnosis Date  . Abnormal echocardiogram 06/07/2019  . Acute on chronic combined systolic and diastolic HF (heart failure) ([place]) 10/26/2020  . Anoxic brain injury ([place]) 10/26/2020  . Arrhythmia   . Ascending aortic aneurysm (HCC) 4.2 cm based on CT from December 2020 01/29/2018   4.3 cm by echo in 2019  . Benign brain tumor ([place])   . Benign neoplasm of brain ([place]) 12/19/2017  . CAD in native artery residual disease of LAD, may need PCI, treating medically for now 10/26/2020  . Chest pain 12/04/2017  . Chronic kidney disease    Stage 3  . Chronic kidney disease, stage III (moderate) ([place]) 12/19/2017  . Diabetes mellitus, type 2 ([place]) 10/06/2020   10/06/20 A1C 7%  . Dizziness 10/11/2019  . Hematuria 10/26/2020  . History of pulmonary embolus (PE)   . Hypertension   . Iron deficiency anemia rec'd IV iron 10/26/2020  . Ischemic cardiomyopathy 10/26/2020  . Left bundle branch block 12/19/2017  . Nonsustained ventricular tachycardia ([place]) 10/11/2019  . Pain of left hip joint 05/29/2020  . Personal history of pulmonary embolism 12/19/2017  . Pulmonary hypertension due to thromboembolism ([place]) 03/09/2018   See echo 12/27/17 with nl PAS vs CTa chest  02/23/18   . S/P angioplasty with stent 10/04/20 DES to LCX  10/26/2020  . Sinus bradycardia 12/22/2017  . Solitary pulmonary nodule on lung CT 03/08/2018   CT 12/04/17 1.0 x 0.8 x 0.7 cm nodular opacity in the RUL vs not seen 03/16/10 posterior segment of the right upper lobe. [place] KitchenSpirometry 03/08/2018    FEV1 3.86 (108%)  Ratio 96 s prior rx  - PET  03/13/18   Low grade c/w adenoca > rec T surgery eval    . Urinary retention 10/26/2020    Past Surgical History:  Procedure Laterality Date  . APPENDECTOMY    . CORONARY/GRAFT ACUTE MI REVASCULARIZATION N/A 10/04/2020   Procedure: Coronary/Graft Acute MI Revascularization;  Surgeon: Nelva Bush, MD;  Location: [facility];  Service: Cardiovascular;  Laterality: N/A;  . HERNIA REPAIR    . LEFT HEART CATH AND CORONARY ANGIOGRAPHY N/A 10/04/2020   Procedure: LEFT HEART CATH AND CORONARY ANGIOGRAPHY;  Surgeon: Nelva Bush, MD;  Location: [facility];  Service: Cardiovascular;  Laterality: N/A;    There were no vitals filed for this visit.   Subjective Assessment - 11/24/20 1042    Subjective Exercises going well at home. Some difficulty and increased leg soreness with sit to stands - reports premorbid diffiuclty with that and with balance because of meningiomas    Patient is accompained by: Family member  Pertinent History HTN, arrhythmia, hx of pulmonary embolus, meningiomas, stage III CKD, R upper and lower lobe lung nodules, ascending aortic aneurysm, and L bundle branch block    Patient Stated Goals Getting back to normal - doing activities around the house, yard work. Improve endurance strength and balance.    Currently in Pain? No/denies             [facility] Adult PT Treatment/Exercise - 11/24/20 0848      Ambulation/Gait   Gait Comments 4/10 RPE 4 laps x 80 ft - CGA with min A x 3 for small LOB - without FWW today      Berg Balance Test   Sit to Stand Able to stand using hands after several tries    Standing Unsupported  Able to stand safely 2 minutes    Sitting with Back Unsupported but Feet Supported on Floor or Stool Able to sit safely and securely 2 minutes    Stand to Sit Controls descent by using hands    Transfers Able to transfer safely, definite need of hands    Standing Unsupported with Eyes Closed Able to stand 3 seconds    Standing Ubsupported with Feet Together Able to place feet together independently but unable to hold for 30 seconds    From Standing, Reach Forward with Outstretched Arm Can reach forward >5 cm safely (2")    From Standing Position, Pick up Object from Floor Able to pick up shoe, needs supervision    From Standing Position, Turn to Look Behind Over each Shoulder Needs supervision when turning    Turn 360 Degrees Needs close supervision or verbal cueing   sways upon stopping   Standing Unsupported, Alternately Place Feet on Step/Stool Able to stand independently and complete 8 steps >20 seconds    Standing Unsupported, One Foot in [employer] balance while stepping or standing    Standing on One Leg Unable to try or needs assist to prevent fall    Total Score 30      High Level Balance   High Level Balance Activities Marching forwards;Marching backwards;Negotitating around obstacles   in  bars     Lumbar Exercises: Seated   Long Arc Quad on Chair Both;AROM;3 sets;5 reps    Other Seated Lumbar Exercises Seated ankle pumps 10 x 2. Seated march      Knee/Hip Exercises: Standing   Hip Abduction Stengthening;Both;2 sets;10 reps    Other Standing Knee Exercises Standing marches B 10 x 2  bars               Balance Exercises - 11/24/20 0001      OTAGO PROGRAM   Backwards Walking --   Backwards stepping x 10 B alternating. backwards walk with UE support  bars x 4 laps   Walking and Turning Around --   walkig around cones min A o prevent LOB with turns - often crossing feet   Sideways Walking --   4 laps              PT Short Term Goals - 11/18/20 2242       PT SHORT TERM GOAL #1   Title Pt will be I and compliant with initial HEP.    Time 2    Period Weeks    Status New    Target Date 12/02/20             PT Long Term Goals - 11/18/20 2242      PT  LONG TERM GOAL #1   Title Pt will return to walking for 45 minutes as daily exercise not limited by shortness of breath    Time 8    Period Weeks    Status New    Target Date 01/13/21      PT LONG TERM GOAL #2   Title BLE strength increased to grossly 4+/5    Time 8    Status New    Target Date 01/13/21      PT LONG TERM GOAL #3   Title Pt will be able to complete 6 MWT distance of at least 500 m with no greater than 4/10 RPE or Borg Dypsnea scale rating    Time 8    Period Weeks    Status New    Target Date 01/13/21      PT LONG TERM GOAL #4   Title Pt will score at least 7 points higher than initial berg score to demo decreased falls risk    Time 8    Period Weeks    Status New    Target Date 01/13/21                 Plan - 11/24/20 1046    Clinical Impression Statement Rein tolerated today's session well. Berg completed today and he scored 30/56 indicating high falls risk at this time. He was able to reverse demo home exercises well, and pt/wife report they have been monitoring blood pressure at home and it has been pretty steady. BP assessed start of session today and RPE monitroed throughout to assess activity tolerance and WFL throughout. He reports he is doing more walking without walker inside the home  for short distances- practiced gait training without walker longer distances in clinic with 4 instances of instability/small LOB with fatigue. With practice of short distance walks no large LOB with overall good safety but recommend definite use of walker with longer distances in home and community for safety.    Personal Factors and Comorbidities Age;Time since onset of injury/illness/exacerbation;Comorbidity 3+;Fitness;Past/Current Experience    Rehab Potential  Good    PT Frequency 2x / week    PT Duration 8 weeks    PT Treatment/Interventions ADLs/Self Care Home Management;Neuromuscular re-education;Balance training;Therapeutic exercise;Therapeutic activities;Functional mobility training;Patient/family education;Stair training;Manual techniques;Energy conservation;Taping;Vestibular    PT Next Visit Plan Update HEP to more standing exercises/balance as tolerated next visit. Monitor VS as needed. Slowly progress endurance strength and balance as tolerated. Monitor of dyspnea/SOB on exertion and utilize RPE to grade activity    Consulted and Agree with Plan of Care Patient;Family member/caregiver    Family Member Consulted Wife, Delcie Roch           Patient will benefit from skilled therapeutic intervention in order to improve the following deficits and impairments:  Abnormal gait,Decreased strength,Improper body mechanics,Postural dysfunction,Difficulty walking,Cardiopulmonary status limiting activity,Decreased activity tolerance,Decreased balance,Pain,Decreased endurance  Visit Diagnosis: Muscle weakness (generalized)  Anoxic brain injury ([place])  Pain in left hip  Stiffness of left hip, not elsewhere classified  Other abnormalities of gait and mobility     Problem List Patient Active Problem List   Diagnosis Date Noted  . Ischemic cardiomyopathy 10/26/2020  . DM (diabetes mellitus), type 2 ([place]) new 10/26/2020  . Hematuria 10/26/2020  . Urinary retention 10/26/2020  . Iron deficiency anemia rec'd IV iron 10/26/2020  . Anoxic brain injury ([place]) 10/26/2020  . HLD (hyperlipidemia) 10/26/2020  . CAD in native artery residual disease of LAD, may need PCI, treating  medically for now 10/26/2020  . S/P angioplasty with stent 10/04/20 DES to LCX  10/26/2020  . Acute on chronic combined systolic and diastolic HF (heart failure) ([place]) 10/26/2020  . Pneumonia of both lungs due to infectious organism   . Acute respiratory failure ([place]), hypothermia  therapy, vent - extubated 10/06/20   . Acute blood loss anemia 10/05/2020  . AKI (acute kidney injury) ([place]) 10/05/2020  . STEMI (ST elevation myocardial infarction) ([place]) 10/04/2020  . Encounter for central line placement   . Encounter for imaging study to confirm orogastric (OG) tube placement   . SOB (shortness of breath)   . Cardiac arrest with ventricular fibrillation ([place])   . Pain of left hip joint 05/29/2020  . Benign brain tumor ([place])   . Chronic kidney disease   . History of pulmonary embolus (PE)   . Hypertension   . Nonsustained ventricular tachycardia ([place]) 10/11/2019  . Dizziness 10/11/2019  . Abnormal echocardiogram 06/07/2019  . Pulmonary hypertension due to thromboembolism ([place]) 03/09/2018  . Solitary pulmonary nodule on lung CT 03/08/2018  . Ascending aortic aneurysm (HCC) 4.2 cm based on CT from December 2020 01/29/2018  . Sinus bradycardia 12/22/2017  . Left bundle branch block 12/19/2017  . Personal history of pulmonary embolism 12/19/2017  . Chronic kidney disease, stage III (moderate) ([place]) 12/19/2017  . Benign neoplasm of brain ([place]) 12/19/2017  . Chest pain 12/04/2017    Hall Busing, PT, DPT 11/24/2020, 10:59 AM  [place]. [place], [place], [address] Phone: [phone]   Fax:  [phone]  Name: Timothy Moran MRN: [id number] Date of Birth: 27-Oct-1945

## 2020-11-25 ENCOUNTER — Encounter: Payer: Medicare Other | Attending: Physical Medicine & Rehabilitation | Admitting: Physical Medicine & Rehabilitation

## 2020-11-25 ENCOUNTER — Other Ambulatory Visit: Payer: Self-pay

## 2020-11-25 ENCOUNTER — Encounter: Payer: Self-pay | Admitting: Physical Medicine & Rehabilitation

## 2020-11-25 VITALS — BP 107/68 | HR 64 | Temp 97.9°F | Ht 73.0 in | Wt 216.0 lb

## 2020-11-25 DIAGNOSIS — R339 Retention of urine, unspecified: Secondary | ICD-10-CM | POA: Insufficient documentation

## 2020-11-25 DIAGNOSIS — G931 Anoxic brain damage, not elsewhere classified: Secondary | ICD-10-CM | POA: Insufficient documentation

## 2020-11-25 DIAGNOSIS — I255 Ischemic cardiomyopathy: Secondary | ICD-10-CM

## 2020-11-25 NOTE — Progress Notes (Signed)
Subjective:    Patient ID: Timothy Moran, male    DOB: 1946-08-14, 75 y.o.   MRN: [id number]  HPI  Timothy Moran is here in follow-up of his V. fib cardiac arrest and associated anoxic brain injury.  I last saw him I was in the hospital on inpatient rehab.  His continue to do quite well to make nice gains.  He still has his LifeVest in place per cardiology.  He still is working with outpatient therapy but wrapping up there at neuro rehab.  He denies any pain.  He feels that his cognition is nearing baseline.  He does have some baseline balance deficits led to his acoustic neuromas and meningiomas.  He was scheduled for surgery prior to his event leading up to this most recent hospitalization.  He is sleeping fairly well.  He continues to wear only catheter due to his urine retention and prostate hypertrophy.  He is supposed to be seeing urology regarding potential intervention.  Pain Inventory Average Pain 0 Pain Right Now 0 My pain is No pain   In the last 24 hours, has pain interfered with the following? General activity No pain Relation with others No pain Enjoyment of life No pain What TIME of day is your pain at its worst? No pain Sleep (in general) Fair  Pain is worse with: No pain Pain improves with: No pain Relief from Meds: No pain  how many minutes can you walk? 30 mins ability to climb steps?  yes do you drive?  no  retired  bladder control problems bowel control problems  Any changes since last visit?  no New Patient  Any changes since last visit?  no New Patient    Family History  Problem Relation Age of Onset  . Diabetes Mother   . Brain cancer Sister   . Melanoma Brother        Radiation Exposure  . Leukemia Brother        Agent Orange   Social History   Socioeconomic History  . Marital status: Married    Spouse name: Timothy Moran   . Number of children: 1  . Years of education: Not on file  . Highest education level: Not on file   Occupational History  . Not on file  Tobacco Use  . Smoking status: Former Smoker    Packs/day: 0.50    Years: 5.00    Pack years: 2.50    Types: Cigarettes    Quit date: 09/12/1968    Years since quitting: 52.2  . Smokeless tobacco: Never Used  Vaping Use  . Vaping Use: Never used  Substance and Sexual Activity  . Alcohol use: Yes    Comment: ONE PER WEEK  . Drug use: Never  . Sexual activity: Not on file  Other Topics Concern  . Not on file  Social History Narrative  . Not on file   Social Determinants of Health   Financial Resource Strain: Not on file  Food Insecurity: Not on file  Transportation Needs: Not on file  Physical Activity: Not on file  Stress: Not on file  Social Connections: Not on file   Past Surgical History:  Procedure Laterality Date  . APPENDECTOMY    . CORONARY/GRAFT ACUTE MI REVASCULARIZATION N/A 10/04/2020   Procedure: Coronary/Graft Acute MI Revascularization;  Surgeon: Nelva Bush, MD;  Location: [facility];  Service: Cardiovascular;  Laterality: N/A;  . HERNIA REPAIR    . LEFT HEART CATH AND CORONARY ANGIOGRAPHY  N/A 10/04/2020   Procedure: LEFT HEART CATH AND CORONARY ANGIOGRAPHY;  Surgeon: Nelva Bush, MD;  Location: [facility];  Service: Cardiovascular;  Laterality: N/A;   Past Medical History:  Diagnosis Date  . Abnormal echocardiogram 06/07/2019  . Acute on chronic combined systolic and diastolic HF (heart failure) ([place]) 10/26/2020  . Anoxic brain injury ([place]) 10/26/2020  . Arrhythmia   . Ascending aortic aneurysm (HCC) 4.2 cm based on CT from December 2020 01/29/2018   4.3 cm by echo in 2019  . Benign brain tumor ([place])   . Benign neoplasm of brain ([place]) 12/19/2017  . CAD in native artery residual disease of LAD, may need PCI, treating medically for now 10/26/2020  . Chest pain 12/04/2017  . Chronic kidney disease    Stage 3  . Chronic kidney disease, stage III (moderate) ([place]) 12/19/2017  . Diabetes mellitus, type 2  ([place]) 10/06/2020   10/06/20 A1C 7%  . Dizziness 10/11/2019  . Hematuria 10/26/2020  . History of pulmonary embolus (PE)   . Hypertension   . Iron deficiency anemia rec'd IV iron 10/26/2020  . Ischemic cardiomyopathy 10/26/2020  . Left bundle branch block 12/19/2017  . Nonsustained ventricular tachycardia ([place]) 10/11/2019  . Pain of left hip joint 05/29/2020  . Personal history of pulmonary embolism 12/19/2017  . Pulmonary hypertension due to thromboembolism ([place]) 03/09/2018   See echo 12/27/17 with nl PAS vs CTa chest 02/23/18   . S/P angioplasty with stent 10/04/20 DES to LCX  10/26/2020  . Sinus bradycardia 12/22/2017  . Solitary pulmonary nodule on lung CT 03/08/2018   CT 12/04/17 1.0 x 0.8 x 0.7 cm nodular opacity in the RUL vs not seen 03/16/10 posterior segment of the right upper lobe. Marland KitchenSpirometry 03/08/2018    FEV1 3.86 (108%)  Ratio 96 s prior rx  - PET  03/13/18   Low grade c/w adenoca > rec T surgery eval    . Urinary retention 10/26/2020   BP 107/68   Pulse 64   Temp 97.9 F (36.6 C)   Ht 6\' 1"  (1.854 m)   Wt 216 lb (98 kg)   SpO2 95%   BMI 28.50 kg/m   Opioid Risk Score:   Fall Risk Score:  `1  Depression screen PHQ 2/9  Depression screen PHQ 2/9 11/25/2020  Decreased Interest 0  Down, Depressed, Hopeless 0  PHQ - 2 Score 0  Altered sleeping 0  Tired, decreased energy 0  Change in appetite 0  Feeling bad or failure about yourself  0  Trouble concentrating 0  Moving slowly or fidgety/restless 0  Suicidal thoughts 0  PHQ-9 Score 0   Review of Systems  Gastrointestinal: Positive for constipation.  Genitourinary:       Foley in-place  All other systems reviewed and are negative.      Objective:   Physical Exam    General: Alert and oriented x 3, No apparent distress HEENT: Head is normocephalic, atraumatic, PERRLA, EOMI, sclera anicteric, oral mucosa pink and moist, dentition intact, ext ear canals clear,  Neck: Supple without JVD or lymphadenopathy Heart: Reg rate and  rhythm. No murmurs rubs or gallops Chest: CTA bilaterally without wheezes, rales, or rhonchi; no distress.  LifeVest in place Abdomen: Soft, non-tender, non-distended, bowel sounds positive. Extremities: No clubbing, cyanosis, or edema. Pulses are 2+ Psych: Pt's affect is appropriate. Pt is cooperative Skin: Clean and intact without signs of breakdown Neuro: Pt is cognitively appropriate with normal insight, memory, and awareness.  He  remembered 2 out of 3 objects after 5 minutes.  Is able to sequence serial sevens and basic numbers and words with extra time.  He is aware of current events.  Abstract thinking was appropriate.  He demonstrated reasonable insight and awareness and concentration.  Cranial nerves 2-12 are intact. Sensory exam is normal. Reflexes are 2+ in all 4's. Fine motor coordination is intact. No tremors. Motor function is grossly 5/5.  He did struggle occasionally with quick changes in direction in had difficulty with heel-to-toe gait dated that this was his baseline. Musculoskeletal: Full ROM, No pain with AROM or PROM in the neck, trunk, or extremities. Posture appropriate      ASSESSMENT and PLAN:  1.  Anoxic brain injury secondary to VF cardiac arrest with EF of 20-25% requiring Lifevest             -Continue outpatient therapies to completion  -I think he is appropriate to drive at this point from a neuromuscular standpoint.  Provided basic driving instruction and directions today.  He should be driving only community distances during the daytime. 2.  Antithrombotics:              -antiplatelet therapy: ASA/Brillinta  3. Pain Management: n/a  4. Mood/Sleep: improved  5. CAD s/p PCI/stent: On ASA/Ticagrelor Lipitor, Isordil, hydralazine and metoprolol.             --Lifevest.  9. VT arrest: Monitor HR tid             --LifeVest              -HR well controlled- continue metoprolol QD 10. BPH/ Urinary retention/hematuria: Foley per Dr. Matilde Sprang in the past              --continue Flomax.                  12. Acoustic neuroma and other menigioma- loss of hearing on R? side: Chronic dizziness/HA .              --followed by Dr. Dema Severin at [facility] (was scheduled for Gamma knife radiosurgery)             -Surgery perhaps at some point given his cardiac issues.  13. Acute on chronic renal failure Stage IIIa: A                 14. Anemia of chronic disease:     Fifteen minutes of face to face patient care time were spent during this visit. All questions were encouraged and answered.  Follow up with me PRN .

## 2020-11-25 NOTE — Therapy (Signed)
[facility]. [place], [place], [address] Phone: [phone]   Fax:  [phone]  Occupational Therapy Treatment  Patient Details  Name: Timothy Moran MRN: [id number] Date of Birth: April 01, 1946 Referring Provider (OT): Reesa Chew, [place]   Encounter Date: 11/24/2020   OT End of Session - 11/24/20 1732    Visit Number 3    Number of Visits 5    Date for OT Re-Evaluation 12/25/20    Authorization Type Medicare    OT Start Time 0930    OT Stop Time 1000    OT Time Calculation (min) 30 min    Activity Tolerance Patient tolerated treatment well;Other (comment)   Patient limited by cardiopulmonary status   Behavior During Therapy [facility] for tasks assessed/performed           Past Medical History:  Diagnosis Date  . Abnormal echocardiogram 06/07/2019  . Acute on chronic combined systolic and diastolic HF (heart failure) ([place]) 10/26/2020  . Anoxic brain injury ([place]) 10/26/2020  . Arrhythmia   . Ascending aortic aneurysm (HCC) 4.2 cm based on CT from December 2020 01/29/2018   4.3 cm by echo in 2019  . Benign brain tumor ([place])   . Benign neoplasm of brain ([place]) 12/19/2017  . CAD in native artery residual disease of LAD, may need PCI, treating medically for now 10/26/2020  . Chest pain 12/04/2017  . Chronic kidney disease    Stage 3  . Chronic kidney disease, stage III (moderate) ([place]) 12/19/2017  . Diabetes mellitus, type 2 ([place]) 10/06/2020   10/06/20 A1C 7%  . Dizziness 10/11/2019  . Hematuria 10/26/2020  . History of pulmonary embolus (PE)   . Hypertension   . Iron deficiency anemia rec'd IV iron 10/26/2020  . Ischemic cardiomyopathy 10/26/2020  . Left bundle branch block 12/19/2017  . Nonsustained ventricular tachycardia ([place]) 10/11/2019  . Pain of left hip joint 05/29/2020  . Personal history of pulmonary embolism 12/19/2017  . Pulmonary hypertension due to thromboembolism ([place]) 03/09/2018   See echo 12/27/17 with nl PAS vs  CTa chest 02/23/18   . S/P angioplasty with stent 10/04/20 DES to LCX  10/26/2020  . Sinus bradycardia 12/22/2017  . Solitary pulmonary nodule on lung CT 03/08/2018   CT 12/04/17 1.0 x 0.8 x 0.7 cm nodular opacity in the RUL vs not seen 03/16/10 posterior segment of the right upper lobe. Marland KitchenSpirometry 03/08/2018    FEV1 3.86 (108%)  Ratio 96 s prior rx  - PET  03/13/18   Low grade c/w adenoca > rec T surgery eval    . Urinary retention 10/26/2020    Past Surgical History:  Procedure Laterality Date  . APPENDECTOMY    . CORONARY/GRAFT ACUTE MI REVASCULARIZATION N/A 10/04/2020   Procedure: Coronary/Graft Acute MI Revascularization;  Surgeon: Nelva Bush, MD;  Location: [place] [facility];  Service: Cardiovascular;  Laterality: N/A;  . HERNIA REPAIR    . LEFT HEART CATH AND CORONARY ANGIOGRAPHY N/A 10/04/2020   Procedure: LEFT HEART CATH AND CORONARY ANGIOGRAPHY;  Surgeon: Nelva Bush, MD;  Location: [place] [facility];  Service: Cardiovascular;  Laterality: N/A;    There were no vitals filed for this visit.   Subjective Assessment - 11/24/20 1730    Subjective  Pt's spouse reported he was able to get into the shower independently over the weekend for the first time since onset/hospitalization    Patient is accompanied by: Family member   Timothy Moran (wife)   Pertinent  History HTN, arrhythmia, hx of pulmonary embolus, meningiomas, stage III CKD, R upper and lower lobe lung nodules, ascending aortic aneurysm, and L bundle branch block    Patient Stated Goals Improve balance and endurance; "get back to the things I was doing before this happened"    Currently in Pain? No/denies            OT Treatments/Exercises (OP) - 11/24/20 1000      ADLs   ADL Education Given Yes    Compensatory Strategies OT reviewed energy conservation strategies w/ pt and spouse and discussed carryover to  higher level IADLs at home    General Comments Pt reviewed goals w/ pt and discussed updated/progress of HEP and d/c       Shoulder Exercises: Seated   Row Strengthening;Right;Left;10 reps;Weights   Chest press w/ each UE individually; 2 sets of 10 reps w/ rest break in between due to dyspnea on exertion   Row Weight (lbs) 1#    Flexion Strengthening;Right;Left;10 reps;Weights   Overhead press w/ each UE individually; 2 sets of 10 reps w/ rest break in between due to dyspnea on exertion   Flexion Weight (lbs) 1#      Elbow Exercises   Other elbow exercises BUE bicep curls w/ 1lb dumbbells; 1 set of 20   Pt instructed to progressively increase weight at home prn and per medical recommendations regarding any potential lifting/activity restrictions           OT Education - 11/24/20 1734    Education Details OT reviewed energy conservation strategies and initial HEP; handout provided for additional exercises to include in HEP    Person(s) Educated Patient;Spouse    Methods Explanation;Handout;Demonstration    Comprehension Verbalized understanding;Returned demonstration            OT Short Term Goals - 11/24/20 1738      OT SHORT TERM GOAL #1   Title Pt will be able to independently identify at least two energy conservation strategies to implement at home to improve safety w/ ADLs    Baseline Reports getting fatigued quickly during ADLs    Time 3    Period Weeks    Status Not Met   11/24/20 - pt unable to recall specific strateiges, but is aware of them and has appropriate handouts; OT reviewed energy conservation strategies w/ pt and his spouse   Target Date 12/02/20      OT SHORT TERM GOAL #2   Title Pt will demonstrate safe transfer in/out of shower w/ Mod I    Baseline Currently standby assist for shower transfer    Time 3    Period Weeks    Status Achieved   11/24/20 - per pt/spouse report, pt is Ind w/ transfer in/out of walk-in shower at this time           OT Long Term Goals - 11/24/20 1740      OT LONG TERM GOAL #1   Title Pt will be independent with HEP designed for light BUE  strengthening to facilitate return to leisure activities    Baseline No current HEP due to medical precautions    Time 4    Period Weeks    Status Partially Met   11/24/20 - pt Ind w/ initial HEP and reports compliance; HEP upgraded this session and was encouraged to modify prn     OT LONG TERM GOAL #2   Title Pt will demonstrate understanding of compensatory and safety strategies, using AE  prn, to implement during higher level leisure activities (e.g. yardwork)    Baseline Unable to participate in higher level leisure activities at this time    Time 4    Period Weeks    Status Unable to assess   11/24/20 - pt awaiting medical clearance to initiate mod-strenuous activities at this time           Plan - 11/24/20 1737    Clinical Impression Statement Pt is a 75 y.o. male seen in OP OT due to decreased strength and endurance 2/2 cardiac arrest that occurred 10/04/20. Tx has incorporated education on energy conservation and adaptive strategies as well as implementation of an HEP designed for BUE strengthening. Pt has shown improvements in overall strength and endurance and report improvements ADL/IADL participation at home. Due to improvements in functional activities and consistency of PT services to continue to address endurance, skilled occupational therapy services are not warranted at this time. OT discussed this with pt and he was receptive. Pt encouraged to call back if he notices any specific changes or development of limitations during functional activities and continue to follow-up with his physician, as needed.    OT Occupational Profile and History Detailed Assessment- Review of Records and additional review of physical, cognitive, psychosocial history related to current functional performance    Occupational performance deficits (Please refer to evaluation for details): ADL's;IADL's;Leisure    Body Structure / Function / Physical Skills ADL;Decreased knowledge of  precautions;Balance;Decreased knowledge of use of DME;Body mechanics;Cardiopulmonary status limiting activity;Strength;Pain;Endurance;IADL    Rehab Potential Good    Clinical Decision Making Several treatment options, min-mod task modification necessary    Comorbidities Affecting Occupational Performance: Presence of comorbidities impacting occupational performance    Modification or Assistance to Complete Evaluation  No modification of tasks or assist necessary to complete eval    OT Frequency 1x / week    OT Duration 4 weeks    OT Treatment/Interventions Self-care/ADL training;Therapeutic exercise;Patient/family education;Energy conservation;Therapeutic activities;DME and/or AE instruction;Cognitive remediation/compensation    Plan D/C    Consulted and Agree with Plan of Care Patient;Family member/caregiver    Family Member Consulted Timothy Moran (wife)           Patient will benefit from skilled therapeutic intervention in order to improve the following deficits and impairments:   Body Structure / Function / Physical Skills: ADL,Decreased knowledge of precautions,Balance,Decreased knowledge of use of DME,Body mechanics,Cardiopulmonary status limiting activity,Strength,Pain,Endurance,IADL      OCCUPATIONAL THERAPY DISCHARGE SUMMARY  Visits from Start of Care: 2  Current functional level related to goals / functional outcomes: Per pt and his wife, pt is able to complete BADLs at home independently w/out difficulty and is consistent w/ basic BUE HEP at home. Pt also verbalizes understanding of energy conservation strategies.   Remaining deficits: Decreased overall strength and endurance   Education / Equipment: Energy conservation, adaptive/compensatory strategies, MET levels Plan: Patient agrees to discharge.  Patient goals were partially met. Patient is being discharged due to being pleased with the current functional level.  Pt and his wife report they are pleased w/ current  functional level and that pt is not experiencing difficulty w/ functional activities at home at this time ?????      Visit Diagnosis: Muscle weakness (generalized)  Other symptoms and signs involving the musculoskeletal system    Problem List Patient Active Problem List   Diagnosis Date Noted  . Ischemic cardiomyopathy 10/26/2020  . DM (diabetes mellitus), type 2 ([place]) new 10/26/2020  . Hematuria  10/26/2020  . Urinary retention 10/26/2020  . Iron deficiency anemia rec'd IV iron 10/26/2020  . Anoxic brain injury ([place]) 10/26/2020  . HLD (hyperlipidemia) 10/26/2020  . CAD in native artery residual disease of LAD, may need PCI, treating medically for now 10/26/2020  . S/P angioplasty with stent 10/04/20 DES to LCX  10/26/2020  . Acute on chronic combined systolic and diastolic HF (heart failure) ([place]) 10/26/2020  . Pneumonia of both lungs due to infectious organism   . Acute respiratory failure ([place]), hypothermia therapy, vent - extubated 10/06/20   . Acute blood loss anemia 10/05/2020  . AKI (acute kidney injury) ([place]) 10/05/2020  . STEMI (ST elevation myocardial infarction) ([place]) 10/04/2020  . Encounter for central line placement   . Encounter for imaging study to confirm orogastric (OG) tube placement   . SOB (shortness of breath)   . Cardiac arrest with ventricular fibrillation ([place])   . Pain of left hip joint 05/29/2020  . Benign brain tumor ([place])   . Chronic kidney disease   . History of pulmonary embolus (PE)   . Hypertension   . Nonsustained ventricular tachycardia ([place]) 10/11/2019  . Dizziness 10/11/2019  . Abnormal echocardiogram 06/07/2019  . Pulmonary hypertension due to thromboembolism ([place]) 03/09/2018  . Solitary pulmonary nodule on lung CT 03/08/2018  . Ascending aortic aneurysm (HCC) 4.2 cm based on CT from December 2020 01/29/2018  . Sinus bradycardia 12/22/2017  . Left bundle branch block 12/19/2017  . Personal history of pulmonary embolism 12/19/2017  .  Chronic kidney disease, stage III (moderate) ([place]) 12/19/2017  . Benign neoplasm of brain ([place]) 12/19/2017  . Chest pain 12/04/2017     Kathrine Cords, OTR/L, MSOT 11/25/2020, 1:46 PM  [facility]. [place], [place], [address] Phone: [phone]   Fax:  [phone]  Name: Timothy Moran MRN: [id number] Date of Birth: 1946/07/28

## 2020-11-25 NOTE — Patient Instructions (Signed)
RETURN TO DRIVING PLAN:  WITH THE SUPERVISION OF A LICENSED DRIVER, PLEASE DRIVE IN AN EMPTY PARKING LOT FOR AT LEAST 2-3 TRIALS TO TEST REACTION TIME, VISION, USE OF EQUIPMENT IN CAR, ETC.  IF SUCCESSFUL WITH THE PARKING LOT DRIVING, PROCEED TO SUPERVISED DRIVING TRIALS IN YOUR NEIGHBORHOOD STREETS AT LOW TRAFFIC TIMES TO TEST OBSERVATION TO TRAFFIC SIGNALS, REACTION TIME, ETC. PLEASE ATTEMPT AT LEAST 2-3 TRIALS IN YOUR NEIGHBORHOOD.  IF NEIGHBORHOOD DRIVING IS SUCCESSFUL, YOU MAY PROCEED TO DRIVING IN BUSIER AREAS IN YOUR COMMUNITY WITH SUPERVISION OF A LICENSED DRIVER. PLEASE ATTEMPT AT LEAST 4-5 TRIALS.  IF COMMUNITY DRIVING IS SUCCESSFUL, YOU MAY PROCEED TO DRIVING ALONE, DURING THE DAY TIME, IN NON-PEAK TRAFFIC TIMES. YOU SHOULD DRIVE NO FURTHER THAN 25 MINUTES IN ONE DIRECTION. PLEASE DO NOT DRIVE IF YOU FEEL FATIGUED OR UNDER THE INFLUENCE OF MEDICATION.

## 2020-11-30 ENCOUNTER — Ambulatory Visit: Payer: Medicare Other

## 2020-11-30 ENCOUNTER — Other Ambulatory Visit: Payer: Self-pay

## 2020-11-30 ENCOUNTER — Ambulatory Visit: Payer: Medicare Other | Admitting: Occupational Therapy

## 2020-11-30 DIAGNOSIS — M6281 Muscle weakness (generalized): Secondary | ICD-10-CM | POA: Diagnosis not present

## 2020-11-30 DIAGNOSIS — R2689 Other abnormalities of gait and mobility: Secondary | ICD-10-CM | POA: Diagnosis not present

## 2020-11-30 DIAGNOSIS — G931 Anoxic brain damage, not elsewhere classified: Secondary | ICD-10-CM

## 2020-11-30 DIAGNOSIS — M25652 Stiffness of left hip, not elsewhere classified: Secondary | ICD-10-CM | POA: Diagnosis not present

## 2020-11-30 DIAGNOSIS — M25552 Pain in left hip: Secondary | ICD-10-CM

## 2020-11-30 DIAGNOSIS — R29898 Other symptoms and signs involving the musculoskeletal system: Secondary | ICD-10-CM | POA: Diagnosis not present

## 2020-11-30 NOTE — Therapy (Signed)
[facility]. [place], [place], [address] Phone: [phone]   Fax:  [phone]  Physical Therapy Treatment  Patient Details  Name: Timothy Moran MRN: [id number] Date of Birth: 1945/12/11 Referring Provider (PT): Reesa Chew PA-C   Encounter Date: 11/30/2020   PT End of Session - 11/30/20 1612    Visit Number 3    Number of Visits 17    Date for PT Re-Evaluation 01/13/21    Authorization Type Medicare    PT Start Time 3532    PT Stop Time 1610    PT Time Calculation (min) 40 min    Equipment Utilized During Treatment Gait belt    Activity Tolerance Patient tolerated treatment well    Behavior During Therapy [facility] for tasks assessed/performed           Past Medical History:  Diagnosis Date  . Abnormal echocardiogram 06/07/2019  . Acute on chronic combined systolic and diastolic HF (heart failure) ([place]) 10/26/2020  . Anoxic brain injury ([place]) 10/26/2020  . Arrhythmia   . Ascending aortic aneurysm (HCC) 4.2 cm based on CT from December 2020 01/29/2018   4.3 cm by echo in 2019  . Benign brain tumor ([place])   . Benign neoplasm of brain ([place]) 12/19/2017  . CAD in native artery residual disease of LAD, may need PCI, treating medically for now 10/26/2020  . Chest pain 12/04/2017  . Chronic kidney disease    Stage 3  . Chronic kidney disease, stage III (moderate) ([place]) 12/19/2017  . Diabetes mellitus, type 2 ([place]) 10/06/2020   10/06/20 A1C 7%  . Dizziness 10/11/2019  . Hematuria 10/26/2020  . History of pulmonary embolus (PE)   . Hypertension   . Iron deficiency anemia rec'd IV iron 10/26/2020  . Ischemic cardiomyopathy 10/26/2020  . Left bundle branch block 12/19/2017  . Nonsustained ventricular tachycardia ([place]) 10/11/2019  . Pain of left hip joint 05/29/2020  . Personal history of pulmonary embolism 12/19/2017  . Pulmonary hypertension due to thromboembolism ([place]) 03/09/2018   See echo 12/27/17 with nl PAS vs CTa chest  02/23/18   . S/P angioplasty with stent 10/04/20 DES to LCX  10/26/2020  . Sinus bradycardia 12/22/2017  . Solitary pulmonary nodule on lung CT 03/08/2018   CT 12/04/17 1.0 x 0.8 x 0.7 cm nodular opacity in the RUL vs not seen 03/16/10 posterior segment of the right upper lobe. Marland KitchenSpirometry 03/08/2018    FEV1 3.86 (108%)  Ratio 96 s prior rx  - PET  03/13/18   Low grade c/w adenoca > rec T surgery eval    . Urinary retention 10/26/2020    Past Surgical History:  Procedure Laterality Date  . APPENDECTOMY    . CORONARY/GRAFT ACUTE MI REVASCULARIZATION N/A 10/04/2020   Procedure: Coronary/Graft Acute MI Revascularization;  Surgeon: Nelva Bush, MD;  Location: [place] [facility];  Service: Cardiovascular;  Laterality: N/A;  . HERNIA REPAIR    . LEFT HEART CATH AND CORONARY ANGIOGRAPHY N/A 10/04/2020   Procedure: LEFT HEART CATH AND CORONARY ANGIOGRAPHY;  Surgeon: Nelva Bush, MD;  Location: [place] [facility];  Service: Cardiovascular;  Laterality: N/A;    There were no vitals filed for this visit.   Subjective Assessment - 11/30/20 1532    Subjective Exercises going well at home. Feels like it is getting a litle easier to stand up, stronger.    Pertinent History HTN, arrhythmia, hx of pulmonary embolus, meningiomas, stage III CKD, R upper and lower  lobe lung nodules, ascending aortic aneurysm, and L bundle branch block    Patient Stated Goals Getting back to normal - doing activities around the house, yard work. Improve endurance strength and balance.    Currently in Pain? No/denies              [facility] PT Assessment - 11/30/20 1533      Ambulation/Gait   Gait Comments 5 laps x 80 ft - CGA with min A x 3 for small LOB - without AD            [facility] Adult PT Treatment/Exercise - 11/30/20 1533      Lumbar Exercises: Standing   Other Standing Lumbar Exercises sit to stand: 5 x 2 with UE support. 5 x 3 from high perch elevated mat table.    Other Standing Lumbar Exercises alternaitng  backward steps x 5  - dizziness began requiring seated rest      Knee/Hip Exercises: Standing   Hip Flexion Limitations Standing marches with ski poles 10 x 2    Hip Abduction Stengthening;Both;2 sets;10 reps               Balance Exercises - 11/30/20 0001      OTAGO PROGRAM   Knee Bends --   5 reps x 3 sets with BUE support   Backwards Walking --   Bakcwards  walk x 4 laps with UE suport on elevated mat table   Sideways Walking --   6 laps at elevated mt table - some reports of lightheadednes reqiring seated rest at end of 6th lap   Tandem Walk --   Semitandem x 4 along elevated mat table              PT Short Term Goals - 11/18/20 2242      PT SHORT TERM GOAL #1   Title Pt will be I and compliant with initial HEP.    Time 2    Period Weeks    Status New    Target Date 12/02/20             PT Long Term Goals - 11/18/20 2242      PT LONG TERM GOAL #1   Title Pt will return to walking for 45 minutes as daily exercise not limited by shortness of breath    Time 8    Period Weeks    Status New    Target Date 01/13/21      PT LONG TERM GOAL #2   Title BLE strength increased to grossly 4+/5    Time 8    Status New    Target Date 01/13/21      PT LONG TERM GOAL #3   Title Pt will be able to complete 6 MWT distance of at least 500 m with no greater than 4/10 RPE or Borg Dypsnea scale rating    Time 8    Period Weeks    Status New    Target Date 01/13/21      PT LONG TERM GOAL #4   Title Pt will score at least 7 points higher than initial berg score to demo decreased falls risk    Time 8    Period Weeks    Status New    Target Date 01/13/21                 Plan - 11/30/20 1612    Clinical Impression Statement Rein tolerated today's session fairly. BP regularly assessed  throughout session and was stable at 121/75 - 123/72 throughout, O2 and HR stable as well. We did more standing exercises today. Timothy Moran reported 1 instance of dizziness with  backwards stepping today requiring guarding and seated rest break - dizziness quickly resolved with seated rest and water. He c/o some lightheadedness after standing exercises with sidestepping , observed to be a bit unstable but VS assessed and WNL. Session ended with therapeutic rest break and VS monitoring for recovery and finished session without reports of lightheadedness. Continue to monitor tolerance to prolonged upright activity    Personal Factors and Comorbidities Age;Time since onset of injury/illness/exacerbation;Comorbidity 3+;Fitness;Past/Current Experience    Rehab Potential Good    PT Frequency 2x / week    PT Duration 8 weeks    PT Treatment/Interventions ADLs/Self Care Home Management;Neuromuscular re-education;Balance training;Therapeutic exercise;Therapeutic activities;Functional mobility training;Patient/family education;Stair training;Manual techniques;Energy conservation;Taping;Vestibular    PT Next Visit Plan Update HEP to more standing exercises/balance as tolerated next visit. Monitor VS as needed. Slowly progress endurance strength and balance as tolerated. Monitor of dyspnea/SOB on exertion and utilize RPE to grade activity    Consulted and Agree with Plan of Care Patient           Patient will benefit from skilled therapeutic intervention in order to improve the following deficits and impairments:  Abnormal gait,Decreased strength,Improper body mechanics,Postural dysfunction,Difficulty walking,Cardiopulmonary status limiting activity,Decreased activity tolerance,Decreased balance,Pain,Decreased endurance  Visit Diagnosis: Muscle weakness (generalized)  Anoxic brain injury ([place])  Pain in left hip  Stiffness of left hip, not elsewhere classified  Other abnormalities of gait and mobility     Problem List Patient Active Problem List   Diagnosis Date Noted  . Ischemic cardiomyopathy 10/26/2020  . DM (diabetes mellitus), type 2 ([place]) new 10/26/2020  .  Hematuria 10/26/2020  . Urinary retention 10/26/2020  . Iron deficiency anemia rec'd IV iron 10/26/2020  . Anoxic brain injury ([place]) 10/26/2020  . HLD (hyperlipidemia) 10/26/2020  . CAD in native artery residual disease of LAD, may need PCI, treating medically for now 10/26/2020  . S/P angioplasty with stent 10/04/20 DES to LCX  10/26/2020  . Acute on chronic combined systolic and diastolic HF (heart failure) ([place]) 10/26/2020  . Pneumonia of both lungs due to infectious organism   . Acute respiratory failure ([place]), hypothermia therapy, vent - extubated 10/06/20   . Acute blood loss anemia 10/05/2020  . AKI (acute kidney injury) ([place]) 10/05/2020  . STEMI (ST elevation myocardial infarction) ([place]) 10/04/2020  . Encounter for central line placement   . Encounter for imaging study to confirm orogastric (OG) tube placement   . SOB (shortness of breath)   . Cardiac arrest with ventricular fibrillation ([place])   . Pain of left hip joint 05/29/2020  . Benign brain tumor ([place])   . Chronic kidney disease   . History of pulmonary embolus (PE)   . Hypertension   . Nonsustained ventricular tachycardia ([place]) 10/11/2019  . Dizziness 10/11/2019  . Abnormal echocardiogram 06/07/2019  . Pulmonary hypertension due to thromboembolism ([place]) 03/09/2018  . Solitary pulmonary nodule on lung CT 03/08/2018  . Ascending aortic aneurysm (HCC) 4.2 cm based on CT from December 2020 01/29/2018  . Sinus bradycardia 12/22/2017  . Left bundle branch block 12/19/2017  . Personal history of pulmonary embolism 12/19/2017  . Chronic kidney disease, stage III (moderate) ([place]) 12/19/2017  . Benign neoplasm of brain ([place]) 12/19/2017  . Chest pain 12/04/2017    Hall Busing, PT, DPT 11/30/2020, 6:14 PM  [facility]  [address]. [place], [place], [address] Phone: [phone]   Fax:  [phone]  Name: Timothy Moran MRN: [id number] Date of Birth:  02/08/1946

## 2020-12-01 DIAGNOSIS — R338 Other retention of urine: Secondary | ICD-10-CM | POA: Diagnosis not present

## 2020-12-02 ENCOUNTER — Ambulatory Visit: Payer: Medicare Other

## 2020-12-03 ENCOUNTER — Telehealth: Payer: Self-pay | Admitting: Cardiology

## 2020-12-03 NOTE — Telephone Encounter (Signed)
Spoke with pt regarding new prescription for Brilinta that he received in the mail today. Pt is not aware of why he was sent this medication and the reasoning for taking this new medication. Explained to pt that since he received a stent in his heart artery that this would help prevent that stent from clotting off.  Pt states that he was not told at his last cardiology appointment why he was getting this medication. Answered all of pt's questions regarding brilinta and pt verbalizes understand.

## 2020-12-03 NOTE — Telephone Encounter (Signed)
The patient was discharged from the hospital on Brilinta. This was not a new medication at his follow up appointment. We refilled it for him so he would not run out. Has he been taking Brilinta since discharge from the hospital? Richardson Dopp, PA-C 12/03/2020 18:07

## 2020-12-03 NOTE — Telephone Encounter (Signed)
Pt c/o medication issue:  1. Name of Medication:  ticagrelor (BRILINTA) 90 MG TABS tablet  2. How are you currently taking this medication (dosage and times per day)? N/a   3. Are you having a reaction (difficulty breathing--STAT)? No   4. What is your medication issue?  Patient states he does not know why he has been prescribed this medication. He states he has never had to take a blood thinner and he also has concerns with the side effects. Looks like [place], [place] prescribed. Please return call to discuss.

## 2020-12-04 NOTE — Telephone Encounter (Signed)
Spoke with pt and he is aware to bring medication bottles with him to next appt.  Medication list up to date.

## 2020-12-04 NOTE — Telephone Encounter (Addendum)
Brilinta was on his medication list when he had his appointment. I reviewed his AVS from when he was discharged. It is listed by the generic (Ticagrelor). We have it on his med list from the office visit with me as a medication he told us he was taking.  I called the patient and he reviewed his medications. He is certain he was not taking Brilinta after DC.  The patient notes he is doing well.  He has appointments next month with Dr. Quentin Ore and [name].  I reviewed with one of our interventional cardiologists.  No loading dose is needed.    Please tell the patient to continue taking Brilinta and all of his other meds. Make sure we have his med list up to date in [place]. He should bring all of his medication bottles to all of his appointments.  Richardson Dopp, PA-C    12/04/2020 10:34 AM

## 2020-12-04 NOTE — Telephone Encounter (Signed)
Spoke with pt and he states that he just started the Brilinta 2 days ago when he received it.  Said he never received Brilinta after being discharged from the hospital.  Denies CP or SOB.  Will route to Richardson Dopp, PA-C to make him aware.  Advised pt to continue Brilinta.

## 2020-12-07 ENCOUNTER — Other Ambulatory Visit: Payer: Self-pay

## 2020-12-07 ENCOUNTER — Ambulatory Visit: Payer: Medicare Other | Admitting: Occupational Therapy

## 2020-12-07 ENCOUNTER — Ambulatory Visit: Payer: Medicare Other

## 2020-12-07 DIAGNOSIS — M25552 Pain in left hip: Secondary | ICD-10-CM | POA: Diagnosis not present

## 2020-12-07 DIAGNOSIS — M6281 Muscle weakness (generalized): Secondary | ICD-10-CM

## 2020-12-07 DIAGNOSIS — G931 Anoxic brain damage, not elsewhere classified: Secondary | ICD-10-CM | POA: Diagnosis not present

## 2020-12-07 DIAGNOSIS — R2689 Other abnormalities of gait and mobility: Secondary | ICD-10-CM

## 2020-12-07 DIAGNOSIS — M25652 Stiffness of left hip, not elsewhere classified: Secondary | ICD-10-CM | POA: Diagnosis not present

## 2020-12-07 DIAGNOSIS — R29898 Other symptoms and signs involving the musculoskeletal system: Secondary | ICD-10-CM | POA: Diagnosis not present

## 2020-12-07 NOTE — Therapy (Signed)
[address] [place], [place], [address] Phone: [phone]   Fax:  [phone]  Physical Therapy Treatment  Patient Details  Name: Timothy Moran MRN: [id number] Date of Birth: [birth year] Referring Provider (PT): Reesa Chew PA-C   Encounter Date: 12/07/2020   PT End of Session - 12/07/20 1324    Visit Number 4    Number of Visits 17    Date for PT Re-Evaluation 01/13/21    Authorization Type Medicare    PT Start Time 1316    PT Stop Time 1400    PT Time Calculation (min) 44 min    Equipment Utilized During Treatment Gait belt    Activity Tolerance Patient tolerated treatment well    Behavior During Therapy [facility] for tasks assessed/performed           Past Medical History:  Diagnosis Date  . Abnormal echocardiogram 06/07/2019  . Acute on chronic combined systolic and diastolic HF (heart failure) ([place]) 10/26/2020  . Anoxic brain injury ([place]) 10/26/2020  . Arrhythmia   . Ascending aortic aneurysm (HCC) 4.2 cm based on CT from December 2020 01/29/2018   4.3 cm by echo in 2019  . Benign brain tumor ([place])   . Benign neoplasm of brain ([place]) 12/19/2017  . CAD in native artery residual disease of LAD, may need PCI, treating medically for now 10/26/2020  . Chest pain 12/04/2017  . Chronic kidney disease    Stage 3  . Chronic kidney disease, stage III (moderate) ([place]) 12/19/2017  . Diabetes mellitus, type 2 ([place]) 10/06/2020   10/06/20 A1C 7%  . Dizziness 10/11/2019  . Hematuria 10/26/2020  . History of pulmonary embolus (PE)   . Hypertension   . Iron deficiency anemia rec'd IV iron 10/26/2020  . Ischemic cardiomyopathy 10/26/2020  . Left bundle branch block 12/19/2017  . Nonsustained ventricular tachycardia ([place]) 10/11/2019  . Pain of left hip joint 05/29/2020  . Personal history of pulmonary embolism 12/19/2017  . Pulmonary hypertension due to thromboembolism ([place]) 03/09/2018   See echo 12/27/17 with nl PAS vs CTa chest  02/23/18   . S/P angioplasty with stent 10/04/20 DES to LCX  10/26/2020  . Sinus bradycardia 12/22/2017  . Solitary pulmonary nodule on lung CT 03/08/2018   CT 12/04/17 1.0 x 0.8 x 0.7 cm nodular opacity in the RUL vs not seen 03/16/10 posterior segment of the right upper lobe. [place] KitchenSpirometry 03/08/2018    FEV1 3.86 (108%)  Ratio 96 s prior rx  - PET  03/13/18   Low grade c/w adenoca > rec T surgery eval    . Urinary retention 10/26/2020    Past Surgical History:  Procedure Laterality Date  . APPENDECTOMY    . CORONARY/GRAFT ACUTE MI REVASCULARIZATION N/A 10/04/2020   Procedure: Coronary/Graft Acute MI Revascularization;  Surgeon: Nelva Bush, MD;  Location: [facility];  Service: Cardiovascular;  Laterality: N/A;  . HERNIA REPAIR    . LEFT HEART CATH AND CORONARY ANGIOGRAPHY N/A 10/04/2020   Procedure: LEFT HEART CATH AND CORONARY ANGIOGRAPHY;  Surgeon: Nelva Bush, MD;  Location: [facility];  Service: Cardiovascular;  Laterality: N/A;    There were no vitals filed for this visit.   Subjective Assessment - 12/07/20 1317    Subjective Not a very good week. they Tried to remove catheter and was in alot of pain and was placed again. Has a UTI and taking medication 3x/day. has been feeling more dizzines/lighthededness particularly when getting into  standing from sitting down.    Pertinent History HTN, arrhythmia, hx of pulmonary embolus, meningiomas, stage III CKD, R upper and lower lobe lung nodules, ascending aortic aneurysm, and L bundle branch block    Patient Stated Goals Getting back to normal - doing activities around the house, yard work. Improve endurance strength and balance.    Currently in Pain? No/denies                             [facility] Adult PT Treatment/Exercise - 12/07/20 1321      Ambulation/Gait   Gait Comments 80 ft x 1 - increasing instability with mild lightheadedness reqiring seated rest/recovery.      Therapeutic Activites     Therapeutic Activities Other Therapeutic Activities    Other Therapeutic Activities VS monitoring: initial 104/66 HR 66, O2 98.      Lumbar Exercises: Aerobic   Nustep L1 x 5 minutes      Lumbar Exercises: Standing   Other Standing Lumbar Exercises Sit to stand: 5 x 3 from high perch - holding small playground ball. 5-6/10 RPE      Knee/Hip Exercises: Seated   Long Arc Quad Strengthening;Both;2 sets;10 reps    Long Arc Quad Weight 2 lbs.    Marching Strengthening;Both;2 sets;10 reps    Marching Weights 2 lbs.    Hamstring Curl Strengthening;Both;2 sets;10 reps    Hamstring Limitations redTB    Abduction/Adduction  Strengthening;2 sets;10 reps    Abd/Adduction Limitations green TB                    PT Short Term Goals - 11/18/20 2242      PT SHORT TERM GOAL #1   Title Pt will be I and compliant with initial HEP.    Time 2    Period Weeks    Status New    Target Date 12/02/20             PT Long Term Goals - 11/18/20 2242      PT LONG TERM GOAL #1   Title Pt will return to walking for 45 minutes as daily exercise not limited by shortness of breath    Time 8    Period Weeks    Status New    Target Date 01/13/21      PT LONG TERM GOAL #2   Title BLE strength increased to grossly 4+/5    Time 8    Status New    Target Date 01/13/21      PT LONG TERM GOAL #3   Title Pt will be able to complete 6 MWT distance of at least 500 m with no greater than 4/10 RPE or Borg Dypsnea scale rating    Time 8    Period Weeks    Status New    Target Date 01/13/21      PT LONG TERM GOAL #4   Title Pt will score at least 7 points higher than initial berg score to demo decreased falls risk    Time 8    Period Weeks    Status New    Target Date 01/13/21                 Plan - 12/07/20 1325    Clinical Impression Statement Rein tolerated todays session well. BpPinitially lower today, reassessed throughout session and steady without lightheadedness for all  sitting exercises some lightheadedness with prolonged standing.  He did not report significant SOB or effore with seated exercisesbut was observably slightly  more breathless. Limited tolerance to stadning/walking today so emphasis of todays session on seated TE with good tolerance. Advised in proper hydration and use of AD if needed for longer distances for safety.    Personal Factors and Comorbidities Age;Time since onset of injury/illness/exacerbation;Comorbidity 3+;Fitness;Past/Current Experience    Rehab Potential Good    PT Frequency 2x / week    PT Duration 8 weeks    PT Treatment/Interventions ADLs/Self Care Home Management;Neuromuscular re-education;Balance training;Therapeutic exercise;Therapeutic activities;Functional mobility training;Patient/family education;Stair training;Manual techniques;Energy conservation;Taping;Vestibular    PT Next Visit Plan Update HEP to more standing exercises/balance as tolerated next visit. Monitor VS as needed. Slowly progress endurance strength and balance as tolerated. Monitor of dyspnea/SOB on exertion and utilize RPE to grade activity    PT Home Exercise Plan see pt edu    Consulted and Agree with Plan of Care Patient           Patient will benefit from skilled therapeutic intervention in order to improve the following deficits and impairments:  Abnormal gait,Decreased strength,Improper body mechanics,Postural dysfunction,Difficulty walking,Cardiopulmonary status limiting activity,Decreased activity tolerance,Decreased balance,Pain,Decreased endurance  Visit Diagnosis: Muscle weakness (generalized)  Anoxic brain injury ([place])  Pain in left hip  Stiffness of left hip, not elsewhere classified  Other abnormalities of gait and mobility     Problem List Patient Active Problem List   Diagnosis Date Noted  . Ischemic cardiomyopathy 10/26/2020  . DM (diabetes mellitus), type 2 ([place]) new 10/26/2020  . Hematuria 10/26/2020  . Urinary retention  10/26/2020  . Iron deficiency anemia rec'd IV iron 10/26/2020  . Anoxic brain injury ([place]) 10/26/2020  . HLD (hyperlipidemia) 10/26/2020  . CAD in native artery residual disease of LAD, may need PCI, treating medically for now 10/26/2020  . S/P angioplasty with stent 10/04/20 DES to LCX  10/26/2020  . Acute on chronic combined systolic and diastolic HF (heart failure) ([place]) 10/26/2020  . Pneumonia of both lungs due to infectious organism   . Acute respiratory failure ([place]), hypothermia therapy, vent - extubated 10/06/20   . Acute blood loss anemia 10/05/2020  . AKI (acute kidney injury) ([place]) 10/05/2020  . STEMI (ST elevation myocardial infarction) ([place]) 10/04/2020  . Encounter for central line placement   . Encounter for imaging study to confirm orogastric (OG) tube placement   . SOB (shortness of breath)   . Cardiac arrest with ventricular fibrillation ([place])   . Pain of left hip joint 05/29/2020  . Benign brain tumor ([place])   . Chronic kidney disease   . History of pulmonary embolus (PE)   . Hypertension   . Nonsustained ventricular tachycardia ([place]) 10/11/2019  . Dizziness 10/11/2019  . Abnormal echocardiogram 06/07/2019  . Pulmonary hypertension due to thromboembolism ([place]) 03/09/2018  . Solitary pulmonary nodule on lung CT 03/08/2018  . Ascending aortic aneurysm (HCC) 4.2 cm based on CT from December 2020 01/29/2018  . Sinus bradycardia 12/22/2017  . Left bundle branch block 12/19/2017  . Personal history of pulmonary embolism 12/19/2017  . Chronic kidney disease, stage III (moderate) ([place]) 12/19/2017  . Benign neoplasm of brain ([place]) 12/19/2017  . Chest pain 12/04/2017    Hall Busing, PT, DPT 12/07/2020, 2:50 PM  [address] [place], [place], [address] Phone: [phone]   Fax:  [phone]  Name: Sherman Donaldson MRN: [id number] Date of Birth: 1946/06/25

## 2020-12-09 ENCOUNTER — Other Ambulatory Visit: Payer: Self-pay

## 2020-12-09 ENCOUNTER — Ambulatory Visit: Payer: Medicare Other

## 2020-12-09 DIAGNOSIS — G931 Anoxic brain damage, not elsewhere classified: Secondary | ICD-10-CM | POA: Diagnosis not present

## 2020-12-09 DIAGNOSIS — M6281 Muscle weakness (generalized): Secondary | ICD-10-CM

## 2020-12-09 DIAGNOSIS — R2689 Other abnormalities of gait and mobility: Secondary | ICD-10-CM | POA: Diagnosis not present

## 2020-12-09 DIAGNOSIS — M25552 Pain in left hip: Secondary | ICD-10-CM | POA: Diagnosis not present

## 2020-12-09 DIAGNOSIS — R29898 Other symptoms and signs involving the musculoskeletal system: Secondary | ICD-10-CM | POA: Diagnosis not present

## 2020-12-09 DIAGNOSIS — M25652 Stiffness of left hip, not elsewhere classified: Secondary | ICD-10-CM | POA: Diagnosis not present

## 2020-12-09 NOTE — Therapy (Signed)
[address] [place], [place], [address] Phone: [phone]   Fax:  [phone]  Physical Therapy Treatment  Patient Details  Name: Timothy Moran MRN: [id number] Date of Birth: 04-May-1946 Referring Provider (PT): Timothy Chew PA-C   Encounter Date: 12/09/2020   PT End of Session - 12/09/20 1325    Visit Number 5    Number of Visits 17    Date for PT Re-Evaluation 01/13/21    Authorization Type Medicare    PT Start Time 1316    PT Stop Time 1400    PT Time Calculation (min) 44 min    Activity Tolerance Patient tolerated treatment well    Behavior During Therapy [facility] for tasks assessed/performed           Past Medical History:  Diagnosis Date  . Abnormal echocardiogram 06/07/2019  . Acute on chronic combined systolic and diastolic HF (heart failure) ([place]) 10/26/2020  . Anoxic brain injury ([place]) 10/26/2020  . Arrhythmia   . Ascending aortic aneurysm (HCC) 4.2 cm based on CT from December 2020 01/29/2018   4.3 cm by echo in 2019  . Benign brain tumor ([place])   . Benign neoplasm of brain ([place]) 12/19/2017  . CAD in native artery residual disease of LAD, may need PCI, treating medically for now 10/26/2020  . Chest pain 12/04/2017  . Chronic kidney disease    Stage 3  . Chronic kidney disease, stage III (moderate) ([place]) 12/19/2017  . Diabetes mellitus, type 2 ([place]) 10/06/2020   10/06/20 A1C 7%  . Dizziness 10/11/2019  . Hematuria 10/26/2020  . History of pulmonary embolus (PE)   . Hypertension   . Iron deficiency anemia rec'd IV iron 10/26/2020  . Ischemic cardiomyopathy 10/26/2020  . Left bundle branch block 12/19/2017  . Nonsustained ventricular tachycardia ([place]) 10/11/2019  . Pain of left hip joint 05/29/2020  . Personal history of pulmonary embolism 12/19/2017  . Pulmonary hypertension due to thromboembolism ([place]) 03/09/2018   See echo 12/27/17 with nl PAS vs CTa chest 02/23/18   . S/P angioplasty with stent 10/04/20 DES to  LCX  10/26/2020  . Sinus bradycardia 12/22/2017  . Solitary pulmonary nodule on lung CT 03/08/2018   CT 12/04/17 1.0 x 0.8 x 0.7 cm nodular opacity in the RUL vs not seen 03/16/10 posterior segment of the right upper lobe. Marland KitchenSpirometry 03/08/2018    FEV1 3.86 (108%)  Ratio 96 s prior rx  - PET  03/13/18   Low grade c/w adenoca > rec T surgery eval    . Urinary retention 10/26/2020    Past Surgical History:  Procedure Laterality Date  . APPENDECTOMY    . CORONARY/GRAFT ACUTE MI REVASCULARIZATION N/A 10/04/2020   Procedure: Coronary/Graft Acute MI Revascularization;  Surgeon: Nelva Bush, MD;  Location: [facility];  Service: Cardiovascular;  Laterality: N/A;  . HERNIA REPAIR    . LEFT HEART CATH AND CORONARY ANGIOGRAPHY N/A 10/04/2020   Procedure: LEFT HEART CATH AND CORONARY ANGIOGRAPHY;  Surgeon: Nelva Bush, MD;  Location: [facility];  Service: Cardiovascular;  Laterality: N/A;    There were no vitals filed for this visit.   Subjective Assessment - 12/09/20 1319    Subjective has been feeling pretty lightheaded since yesterday    Pertinent History HTN, arrhythmia, hx of pulmonary embolus, meningiomas, stage III CKD, R upper and lower lobe lung nodules, ascending aortic aneurysm, and L bundle branch block    Patient Stated Goals Getting back  to normal - doing activities around the house, yard work. Improve endurance strength and balance.    Currently in Pain? No/denies               [facility] Adult PT Treatment/Exercise - 12/09/20 0001      High Level Balance   High Level Balance Activities Marching forwards;Marching backwards;Negotitating around obstacles    High Level Balance Comments semitandem balance x 15 sec B with 1 UE support.      Therapeutic Activites    Therapeutic Activities Other Therapeutic Activities    Other Therapeutic Activities VS monitoring: initial 120/64 HR 66, O2 98. 116/62 with some sitting exercises.  Therapeutic rest breaks provided throuhgout  session to manage fatigue and monitor for ay lightheadedness. 125/64 with steady 3/10 lightheadedness after standing exercises                        Knee/Hip Exercises: Standing   Hip Flexion Limitations Standing marches  bars 5x2 B    Hip Abduction Stengthening;Both;2 sets;10 reps    Other Standing Knee Exercises Standing hip ext toe taps in  bars x 10 B.      Knee/Hip Exercises: Seated   Long Arc Quad Strengthening;Both;2 sets;10 reps    Long Arc Quad Weight 2 lbs.    Marching Strengthening;Both;2 sets;10 reps    Marching Weights 2 lbs.    Hamstring Curl Strengthening;Both;2 sets;10 reps    Hamstring Limitations redTB    Abduction/Adduction  Strengthening;2 sets;10 reps    Abd/Adduction Limitations green TB                    PT Short Term Goals - 11/18/20 2242      PT SHORT TERM GOAL #1   Title Pt will be I and compliant with initial HEP.    Time 2    Period Weeks    Status New    Target Date 12/02/20             PT Long Term Goals - 11/18/20 2242      PT LONG TERM GOAL #1   Title Pt will return to walking for 45 minutes as daily exercise not limited by shortness of breath    Time 8    Period Weeks    Status New    Target Date 01/13/21      PT LONG TERM GOAL #2   Title BLE strength increased to grossly 4+/5    Time 8    Status New    Target Date 01/13/21      PT LONG TERM GOAL #3   Title Pt will be able to complete 6 MWT distance of at least 500 m with no greater than 4/10 RPE or Borg Dypsnea scale rating    Time 8    Period Weeks    Status New    Target Date 01/13/21      PT LONG TERM GOAL #4   Title Pt will score at least 7 points higher than initial berg score to demo decreased falls risk    Time 8    Period Weeks    Status New    Target Date 01/13/21                 Plan - 12/09/20 1326    Clinical Impression Statement Timothy Moran came in today with unsteadiness and c/o lightheadedness when walking from lobby to treatment  area. BP assesses pre and post  exercises and was steadily in the range of 116/62 - 125/64. He is following up wiht the cardiologist next week and has an echo scheduled for tomorrow - it was strongly advised that he notify the cardiologist about this lightheadedness and lower blood pressure and he was in agreement. As educated in initial eval he has been taking BP and recording the readings on paper and it was advised that this also be shown to cardiologist. He tolerated all exercises well today but required alot of planned therapeutic rest breaks - he was motivated to kee going despite unsteadiness an dneeded cues for breaks for safety. It was further reinforced that he would benefit from using the walker or other AD for UE support/stability for safety wiht walking especially when feeling more unsteady, verbalizes understanding but seems to not be as open to using AD at this time.    Personal Factors and Comorbidities Age;Time since onset of injury/illness/exacerbation;Comorbidity 3+;Fitness;Past/Current Experience    Rehab Potential Good    PT Frequency 2x / week    PT Duration 8 weeks    PT Treatment/Interventions ADLs/Self Care Home Management;Neuromuscular re-education;Balance training;Therapeutic exercise;Therapeutic activities;Functional mobility training;Patient/family education;Stair training;Manual techniques;Energy conservation;Taping;Vestibular    PT Next Visit Plan Follow up regarding any lightheaded/dizzy symptoms next visit. Update HEP to more standing exercises/balance as tolerated next visit. Monitor VS as needed. Slowly progress endurance strength and balance as tolerated. Monitor of dyspnea/SOB on exertion and utilize RPE to grade activity    Consulted and Agree with Plan of Care Patient           Patient will benefit from skilled therapeutic intervention in order to improve the following deficits and impairments:  Abnormal gait,Decreased strength,Improper body mechanics,Postural  dysfunction,Difficulty walking,Cardiopulmonary status limiting activity,Decreased activity tolerance,Decreased balance,Pain,Decreased endurance  Visit Diagnosis: Muscle weakness (generalized)  Anoxic brain injury ([place])  Pain in left hip  Stiffness of left hip, not elsewhere classified  Other abnormalities of gait and mobility     Problem List Patient Active Problem List   Diagnosis Date Noted  . Ischemic cardiomyopathy 10/26/2020  . DM (diabetes mellitus), type 2 ([place]) new 10/26/2020  . Hematuria 10/26/2020  . Urinary retention 10/26/2020  . Iron deficiency anemia rec'd IV iron 10/26/2020  . Anoxic brain injury ([place]) 10/26/2020  . HLD (hyperlipidemia) 10/26/2020  . CAD in native artery residual disease of LAD, may need PCI, treating medically for now 10/26/2020  . S/P angioplasty with stent 10/04/20 DES to LCX  10/26/2020  . Acute on chronic combined systolic and diastolic HF (heart failure) ([place]) 10/26/2020  . Pneumonia of both lungs due to infectious organism   . Acute respiratory failure ([place]), hypothermia therapy, vent - extubated 10/06/20   . Acute blood loss anemia 10/05/2020  . AKI (acute kidney injury) ([place]) 10/05/2020  . STEMI (ST elevation myocardial infarction) ([place]) 10/04/2020  . Encounter for central line placement   . Encounter for imaging study to confirm orogastric (OG) tube placement   . SOB (shortness of breath)   . Cardiac arrest with ventricular fibrillation ([place])   . Pain of left hip joint 05/29/2020  . Benign brain tumor ([place])   . Chronic kidney disease   . History of pulmonary embolus (PE)   . Hypertension   . Nonsustained ventricular tachycardia ([place]) 10/11/2019  . Dizziness 10/11/2019  . Abnormal echocardiogram 06/07/2019  . Pulmonary hypertension due to thromboembolism ([place]) 03/09/2018  . Solitary pulmonary nodule on lung CT 03/08/2018  . Ascending aortic aneurysm (HCC) 4.2 cm based on  CT from December 2020 01/29/2018  . Sinus bradycardia  12/22/2017  . Left bundle branch block 12/19/2017  . Personal history of pulmonary embolism 12/19/2017  . Chronic kidney disease, stage III (moderate) ([place]) 12/19/2017  . Benign neoplasm of brain ([place]) 12/19/2017  . Chest pain 12/04/2017    Hall Busing, PT, DPT 12/09/2020, 2:04 PM  [address] [place], [place], [address] Phone: [phone]   Fax:  [phone]  Name: Timothy Moran MRN: [id number] Date of Birth: [birth year]

## 2020-12-10 ENCOUNTER — Ambulatory Visit (HOSPITAL_COMMUNITY): Payer: Medicare Other | Attending: Cardiovascular Disease

## 2020-12-10 DIAGNOSIS — I255 Ischemic cardiomyopathy: Secondary | ICD-10-CM | POA: Insufficient documentation

## 2020-12-10 DIAGNOSIS — I519 Heart disease, unspecified: Secondary | ICD-10-CM | POA: Diagnosis not present

## 2020-12-10 LAB — ECHOCARDIOGRAM LIMITED
Area-P 1/2: 3.49 cm2
S' Lateral: 4.5 cm

## 2020-12-10 MED ORDER — PERFLUTREN LIPID MICROSPHERE
1.0000 mL | INTRAVENOUS | Status: AC | PRN
  Administered 2020-12-10: 2 mL via INTRAVENOUS

## 2020-12-14 ENCOUNTER — Ambulatory Visit: Payer: Medicare Other

## 2020-12-14 ENCOUNTER — Telehealth: Payer: Self-pay

## 2020-12-14 NOTE — Telephone Encounter (Addendum)
Left voice message for patient to give office a call and if he doesn't call the office back that he can discuss the results of his Echo at his appointment with Dr. Lysbeth Galas on 12/15/20 at 10 AM.  ----- Message from Almyra Deforest, [place] sent at 12/11/2020 12:33 PM EDT ----- Pumping function of heart still low, has upcoming visit with Dr. Quentin Ore to discuss ICD

## 2020-12-15 ENCOUNTER — Other Ambulatory Visit: Payer: Self-pay

## 2020-12-15 ENCOUNTER — Ambulatory Visit (INDEPENDENT_AMBULATORY_CARE_PROVIDER_SITE_OTHER): Payer: Medicare Other | Admitting: Cardiology

## 2020-12-15 VITALS — BP 106/64 | HR 73 | Ht 73.0 in | Wt 221.8 lb

## 2020-12-15 DIAGNOSIS — I5042 Chronic combined systolic (congestive) and diastolic (congestive) heart failure: Secondary | ICD-10-CM | POA: Diagnosis not present

## 2020-12-15 DIAGNOSIS — I2510 Atherosclerotic heart disease of native coronary artery without angina pectoris: Secondary | ICD-10-CM

## 2020-12-15 DIAGNOSIS — N1832 Chronic kidney disease, stage 3b: Secondary | ICD-10-CM | POA: Diagnosis not present

## 2020-12-15 DIAGNOSIS — I4901 Ventricular fibrillation: Secondary | ICD-10-CM | POA: Diagnosis not present

## 2020-12-15 NOTE — Patient Instructions (Addendum)
Medication Instructions:  Your physician recommends that you continue on your current medications as directed. Please refer to the Current Medication list given to you today.  Labwork: None ordered.  Testing/Procedures: None ordered.  Follow-Up: Your physician wants you to follow-up in: with [name] as scheduled and Dr. Quentin Ore as needed  Any Other Special Instructions Will Be Listed Below (If Applicable).  If you need a refill on your cardiac medications before your next appointment, please call your pharmacy.

## 2020-12-15 NOTE — Progress Notes (Signed)
Electrophysiology Office Note:    Date:  12/15/2020   ID:  Timothy Moran, DOB 05-31-1946, MRN [id number]  PCP:  Cari Caraway, MD  [facility] Cardiologist:  Jenne Campus, MD  [facility] Electrophysiologist:  Vickie Epley, MD   Referring MD: Cari Caraway, MD   Chief Complaint: Ischemic cardiomyopathy and history of out of hospital cardiac arrest  History of Present Illness:    Timothy Moran is a 75 y.o. male who presents for an evaluation of ischemic cardiomyopathy at the request of Dr. Audie Box and Agustin Cree. Their medical history includes coronary artery disease, CKD 3, diabetes, history of PE, hypertension.  Patient has a history of an inferior STEMI in January 2022 which was treated with drug-eluting stents to the distal left circumflex.  Medical therapy was recommended for a mid LAD lesion.  His MI was complicated by an out of hospital ventricular fibrillation arrest requiring CPR and defibrillations.  His hospital course was complicated by anoxic brain injury, urinary retention and hematuria.  He is worn a LifeVest since discharge.  He is now home from rehab.  He is with his wife today in clinic.  He has not had a ICD shock from his LifeVest.  His biggest concerns today are getting the LifeVest off and resolving the urinary obstruction which is required him to wear a Foley bag for the last 7 weeks.  He is tolerating his medications well.  He brings a blood pressure log with him today which shows systolic blood pressures consistently in the 100-110s range.  No syncope or presyncope.  He also has a scheduled brain radiation treatment for a "benign" brain tumor.   Past Medical History:  Diagnosis Date  . Abnormal echocardiogram 06/07/2019  . Acute on chronic combined systolic and diastolic HF (heart failure) ([place]) 10/26/2020  . Anoxic brain injury ([place]) 10/26/2020  . Arrhythmia   . Ascending aortic aneurysm (HCC) 4.2 cm based on CT from December 2020 01/29/2018    4.3 cm by echo in 2019  . Benign brain tumor ([place])   . Benign neoplasm of brain ([place]) 12/19/2017  . CAD in native artery residual disease of LAD, may need PCI, treating medically for now 10/26/2020  . Chest pain 12/04/2017  . Chronic kidney disease    Stage 3  . Chronic kidney disease, stage III (moderate) ([place]) 12/19/2017  . Diabetes mellitus, type 2 ([place]) 10/06/2020   10/06/20 A1C 7%  . Dizziness 10/11/2019  . Hematuria 10/26/2020  . History of pulmonary embolus (PE)   . Hypertension   . Iron deficiency anemia rec'd IV iron 10/26/2020  . Ischemic cardiomyopathy 10/26/2020  . Left bundle branch block 12/19/2017  . Nonsustained ventricular tachycardia ([place]) 10/11/2019  . Pain of left hip joint 05/29/2020  . Personal history of pulmonary embolism 12/19/2017  . Pulmonary hypertension due to thromboembolism ([place]) 03/09/2018   See echo 12/27/17 with nl PAS vs CTa chest 02/23/18   . S/P angioplasty with stent 10/04/20 DES to LCX  10/26/2020  . Sinus bradycardia 12/22/2017  . Solitary pulmonary nodule on lung CT 03/08/2018   CT 12/04/17 1.0 x 0.8 x 0.7 cm nodular opacity in the RUL vs not seen 03/16/10 posterior segment of the right upper lobe. Marland KitchenSpirometry 03/08/2018    FEV1 3.86 (108%)  Ratio 96 s prior rx  - PET  03/13/18   Low grade c/w adenoca > rec T surgery eval    . Urinary retention 10/26/2020    Past Surgical History:  Procedure Laterality Date  .  APPENDECTOMY    . CORONARY/GRAFT ACUTE MI REVASCULARIZATION N/A 10/04/2020   Procedure: Coronary/Graft Acute MI Revascularization;  Surgeon: Nelva Bush, MD;  Location: [place] [facility];  Service: Cardiovascular;  Laterality: N/A;  . HERNIA REPAIR    . LEFT HEART CATH AND CORONARY ANGIOGRAPHY N/A 10/04/2020   Procedure: LEFT HEART CATH AND CORONARY ANGIOGRAPHY;  Surgeon: Nelva Bush, MD;  Location: [place] [facility];  Service: Cardiovascular;  Laterality: N/A;    Current Medications: Current Meds  Medication Sig  . acetaminophen (TYLENOL) 325 MG  tablet Take 1-2 tablets (325-650 mg total) by mouth every 4 (four) hours as needed for mild pain.  Marland Kitchen ampicillin (PRINCIPEN) 500 MG capsule Take 500 mg by mouth 3 (three) times daily.  Marland Kitchen aspirin 81 MG chewable tablet Chew 1 tablet (81 mg total) by mouth daily.  Marland Kitchen atorvastatin (LIPITOR) 80 MG tablet Take 1 tablet (80 mg total) by mouth daily.  . B Complex-C (SUPER B COMPLEX PO) Take 1 tablet by mouth daily.  Marland Kitchen docusate sodium (COLACE) 100 MG capsule Take 2 capsules (200 mg total) by mouth daily.  . hydrALAZINE (APRESOLINE) 25 MG tablet Take 1 tablet (25 mg total) by mouth every 8 (eight) hours.  . isosorbide dinitrate (ISORDIL) 30 MG tablet Take 1 tablet (30 mg total) by mouth 2 (two) times daily.  . melatonin 5 MG TABS Take 1 tablet (5 mg total) by mouth at bedtime.  . metoprolol succinate (TOPROL-XL) 50 MG 24 hr tablet Take 1 tablet (50 mg total) by mouth daily. Take with or immediately following a meal.  . Multiple Vitamin (MULTIVITAMIN WITH MINERALS) TABS tablet Take 1 tablet by mouth daily.  . nitroGLYCERIN (NITROSTAT) 0.4 MG SL tablet Place 1 tablet (0.4 mg total) under the tongue every 5 (five) minutes as needed for chest pain.  Marland Kitchen senna-docusate (SENOKOT-S) 8.6-50 MG tablet Take 1 tablet by mouth at bedtime.  . sodium bicarbonate 650 MG tablet Take 2 tablets (1,300 mg total) by mouth 3 (three) times daily.  . tamsulosin (FLOMAX) 0.4 MG CAPS capsule Take 1 capsule (0.4 mg total) by mouth daily.  . ticagrelor (BRILINTA) 90 MG TABS tablet Take 1 tablet (90 mg total) by mouth 2 (two) times daily.  . TRADJENTA 5 MG TABS tablet Take 5 mg by mouth daily.  . traZODone (DESYREL) 50 MG tablet Take 1 tablet (50 mg total) by mouth at bedtime.  . valsartan (DIOVAN) 160 MG tablet Take 160 mg by mouth daily.     Allergies:   Patient has no known allergies.   Social History   Socioeconomic History  . Marital status: Married    Spouse name: Mekhi Sonn   . Number of children: 1  . Years of  education: Not on file  . Highest education level: Not on file  Occupational History  . Not on file  Tobacco Use  . Smoking status: Former Smoker    Packs/day: 0.50    Years: 5.00    Pack years: 2.50    Types: Cigarettes    Quit date: 09/12/1968    Years since quitting: 52.2  . Smokeless tobacco: Never Used  Vaping Use  . Vaping Use: Never used  Substance and Sexual Activity  . Alcohol use: Yes    Comment: ONE PER WEEK  . Drug use: Never  . Sexual activity: Not on file  Other Topics Concern  . Not on file  Social History Narrative  . Not on file   Social Determinants of  Health   Financial Resource Strain: Not on file  Food Insecurity: Not on file  Transportation Needs: Not on file  Physical Activity: Not on file  Stress: Not on file  Social Connections: Not on file     Family History: The patient's family history includes Brain cancer in his sister; Diabetes in his mother; Leukemia in his brother; Melanoma in his brother.  ROS:   Please see the history of present illness.    All other systems reviewed and are negative.  EKGs/Labs/Other Studies Reviewed:    The following studies were reviewed today:  December 10, 2020 echo personally reviewed Left ventricular function severely reduced, 25% Right ventricular function normal No significant valvular abnormalities   EKG:  The ekg ordered today demonstrates sinus rhythm, left bundle branch block  Recent Labs: 10/06/2020: TSH 3.917 10/08/2020: B Natriuretic Peptide 1,434.0 10/12/2020: Magnesium 2.2 10/30/2020: ALT 50 11/02/2020: Hemoglobin 9.0; Platelets 284 11/18/2020: BUN 27; Creatinine, Ser 1.95; Potassium 4.8; Sodium 139  Recent Lipid Panel    Component Value Date/Time   CHOL 114 10/06/2020 0317   CHOL 247 (H) 06/01/2020 0858   TRIG 163 (H) 10/06/2020 0317   HDL 39 (L) 10/06/2020 0317   HDL 33 (L) 06/01/2020 0858   CHOLHDL 2.9 10/06/2020 0317   VLDL 33 10/06/2020 0317   LDLCALC 42 10/06/2020 0317   LDLCALC 151  (H) 06/01/2020 0858    Physical Exam:    VS:  BP 106/64   Pulse 73   Ht 6\' 1"  (1.854 m)   Wt 221 lb 12.8 oz (100.6 kg)   SpO2 95%   BMI 29.26 kg/m     Wt Readings from Last 3 Encounters:  12/15/20 221 lb 12.8 oz (100.6 kg)  11/25/20 216 lb (98 kg)  11/18/20 215 lb 6.4 oz (97.7 kg)     GEN:  Well nourished, well developed in no acute distress HEENT: Normal NECK: No JVD; No carotid bruits LYMPHATICS: No lymphadenopathy CARDIAC: RRR, no murmurs, rubs, gallops.  Wearing LifeVest. RESPIRATORY:  Clear to auscultation without rales, wheezing or rhonchi  ABDOMEN: Soft, non-tender, non-distended MUSCULOSKELETAL:  No edema; No deformity  SKIN: Warm and dry NEUROLOGIC:  Alert and oriented x 3 PSYCHIATRIC:  Normal affect   ASSESSMENT:    1. Ventricular fibrillation ([place])   2. Chronic combined systolic (congestive) and diastolic (congestive) heart failure ([place])   3. Coronary artery disease involving native coronary artery of native heart without angina pectoris   4. Stage 3b chronic kidney disease ([place])    PLAN:    In order of problems listed above:  1. History of ventricular fibrillation cardiac arrest in setting of STEMI Now post stenting of his circumflex.  Has been electrically stable since PCI.  Does have a residual LAD stenosis that was delayed because of severe renal dysfunction after his arrest.  He has NYHA class II symptoms today.  Seems warm and dry on my exam.  We had a long discussion during today's appointment about his risk of life-threatening heart rhythms.  He certainly is at an increased risk of life-threatening heart rhythms given his history of coronary artery disease, residual LAD stenosis and left ventricular dysfunction.  That being said, he is at an increased risk of complications with defibrillator implant given his indwelling Foley catheter, age, DAPT and brain tumor.    After long discussion, we decided that we would not pursue defibrillator implant.  I  had the chance to discuss his case with [name] who is his  primary cardiologist.  He has an appointment with him in 2 weeks.  At that appointment they will discuss whether or not to pursue PCI of the residual LAD stenosis.  In theory, stenting this lesion could help reduce his residual risk of sudden cardiac death and ventricular arrhythmias.  2.  Chronic combined systolic and diastolic heart failure secondary to ischemic cardiomyopathy and coronary artery disease NYHA class II.  Warm and dry on my exam.  Continue medical therapy with hydralazine, Isordil, metoprolol, valsartan.   Total time spent with patient today 65 minutes. This includes reviewing records, evaluating the patient and coordinating care.  Medication Adjustments/Labs and Tests Ordered: Current medicines are reviewed at length with the patient today.  Concerns regarding medicines are outlined above.  No orders of the defined types were placed in this encounter.  No orders of the defined types were placed in this encounter.    Signed, Lars Mage, MD, [facility]  12/15/2020 11:05 AM    Electrophysiology [place]

## 2020-12-16 ENCOUNTER — Ambulatory Visit: Payer: Medicare Other | Attending: Physical Medicine and Rehabilitation

## 2020-12-16 ENCOUNTER — Telehealth: Payer: Self-pay | Admitting: Cardiology

## 2020-12-16 DIAGNOSIS — G931 Anoxic brain damage, not elsewhere classified: Secondary | ICD-10-CM | POA: Diagnosis not present

## 2020-12-16 DIAGNOSIS — M25652 Stiffness of left hip, not elsewhere classified: Secondary | ICD-10-CM | POA: Diagnosis not present

## 2020-12-16 DIAGNOSIS — M25552 Pain in left hip: Secondary | ICD-10-CM | POA: Insufficient documentation

## 2020-12-16 DIAGNOSIS — M6281 Muscle weakness (generalized): Secondary | ICD-10-CM | POA: Diagnosis not present

## 2020-12-16 DIAGNOSIS — R2689 Other abnormalities of gait and mobility: Secondary | ICD-10-CM | POA: Diagnosis not present

## 2020-12-16 DIAGNOSIS — N401 Enlarged prostate with lower urinary tract symptoms: Secondary | ICD-10-CM | POA: Diagnosis not present

## 2020-12-16 DIAGNOSIS — R29898 Other symptoms and signs involving the musculoskeletal system: Secondary | ICD-10-CM | POA: Diagnosis not present

## 2020-12-16 DIAGNOSIS — R3914 Feeling of incomplete bladder emptying: Secondary | ICD-10-CM | POA: Diagnosis not present

## 2020-12-16 NOTE — Therapy (Signed)
[place]. [place], [place], [address] Phone: [phone]   Fax:  [phone]  Physical Therapy Treatment  Patient Details  Name: Timothy Moran MRN: [id number] Date of Birth: March 09, 1946 Referring Provider (PT): Reesa Chew PA-C   Encounter Date: 12/16/2020   PT End of Session - 12/16/20 1321    Visit Number 6    Number of Visits 17    Date for PT Re-Evaluation 01/13/21    Authorization Type Medicare    PT Start Time 1315    PT Stop Time 1356    PT Time Calculation (min) 41 min    Equipment Utilized During Treatment Gait belt    Activity Tolerance Patient tolerated treatment well    Behavior During Therapy [facility] for tasks assessed/performed           Past Medical History:  Diagnosis Date  . Abnormal echocardiogram 06/07/2019  . Acute on chronic combined systolic and diastolic HF (heart failure) ([place]) 10/26/2020  . Anoxic brain injury ([place]) 10/26/2020  . Arrhythmia   . Ascending aortic aneurysm (HCC) 4.2 cm based on CT from December 2020 01/29/2018   4.3 cm by echo in 2019  . Benign brain tumor ([place])   . Benign neoplasm of brain ([place]) 12/19/2017  . CAD in native artery residual disease of LAD, may need PCI, treating medically for now 10/26/2020  . Chest pain 12/04/2017  . Chronic kidney disease    Stage 3  . Chronic kidney disease, stage III (moderate) ([place]) 12/19/2017  . Diabetes mellitus, type 2 ([place]) 10/06/2020   10/06/20 A1C 7%  . Dizziness 10/11/2019  . Hematuria 10/26/2020  . History of pulmonary embolus (PE)   . Hypertension   . Iron deficiency anemia rec'd IV iron 10/26/2020  . Ischemic cardiomyopathy 10/26/2020  . Left bundle branch block 12/19/2017  . Nonsustained ventricular tachycardia ([place]) 10/11/2019  . Pain of left hip joint 05/29/2020  . Personal history of pulmonary embolism 12/19/2017  . Pulmonary hypertension due to thromboembolism ([place]) 03/09/2018   See echo 12/27/17 with nl PAS vs CTa chest  02/23/18   . S/P angioplasty with stent 10/04/20 DES to LCX  10/26/2020  . Sinus bradycardia 12/22/2017  . Solitary pulmonary nodule on lung CT 03/08/2018   CT 12/04/17 1.0 x 0.8 x 0.7 cm nodular opacity in the RUL vs not seen 03/16/10 posterior segment of the right upper lobe. Marland KitchenSpirometry 03/08/2018    FEV1 3.86 (108%)  Ratio 96 s prior rx  - PET  03/13/18   Low grade c/w adenoca > rec T surgery eval    . Urinary retention 10/26/2020    Past Surgical History:  Procedure Laterality Date  . APPENDECTOMY    . CORONARY/GRAFT ACUTE MI REVASCULARIZATION N/A 10/04/2020   Procedure: Coronary/Graft Acute MI Revascularization;  Surgeon: Nelva Bush, MD;  Location: [place] [facility];  Service: Cardiovascular;  Laterality: N/A;  . HERNIA REPAIR    . LEFT HEART CATH AND CORONARY ANGIOGRAPHY N/A 10/04/2020   Procedure: LEFT HEART CATH AND CORONARY ANGIOGRAPHY;  Surgeon: Nelva Bush, MD;  Location: [place] [facility];  Service: Cardiovascular;  Laterality: N/A;    There were no vitals filed for this visit.   Subjective Assessment - 12/16/20 1320    Subjective Saw cardiologist - Lifevest taken off. Still has catheter in for few more months until he gets prostate surgery. Says MD told him to take it easy if he gets lightheadedness but no changes to medications  made    Pertinent History HTN, arrhythmia, hx of pulmonary embolus, meningiomas, stage III CKD, R upper and lower lobe lung nodules, ascending aortic aneurysm, and L bundle branch block    Patient Stated Goals Getting back to normal - doing activities around the house, yard work. Improve endurance strength and balance.    Currently in Pain? No/denies                             [facility] Adult PT Treatment/Exercise - 12/16/20 0001      High Level Balance   High Level Balance Activities Marching forwards;Marching backwards;Tandem walking;Turns      Therapeutic Activites    Therapeutic Activities Other Therapeutic Activities     Other Therapeutic Activities VS monitoring: initial 125/64 HR 66, O2 98. VSS throughout session. Therapeutic rest breaks provided throuhgout session to manage fatigue and monitor for ay lightheadedness.      Lumbar Exercises: Aerobic   Nustep L2 x 5 minutes      Lumbar Exercises: Standing   Row Strengthening;Both;10 reps   red 2 sets   Other Standing Lumbar Exercises Sit to stand: 5 x 2 from chair.  5 x 2 from high perch - holding small playground ball. 5-6/10 RPE      Knee/Hip Exercises: Standing   Knee Flexion Strengthening;Both;5 reps;3 sets    Hip Flexion Limitations --    Hip Abduction Stengthening;Both;3 sets;5 reps   2#   Hip Extension Stengthening;1 set;5 reps;Both;Knee straight   BUE supported, 2#     Knee/Hip Exercises: Seated   Long Arc Quad Strengthening;Both;2 sets;10 reps    Long Arc Quad Weight 3 lbs.    Marching Strengthening;Both;2 sets;10 reps    Marching Weights 3 lbs.    Hamstring Curl Strengthening;Both;2 sets;10 reps    Hamstring Limitations redTB    Abduction/Adduction  Strengthening;2 sets;10 reps    Abd/Adduction Limitations green TB                    PT Short Term Goals - 11/18/20 2242      PT SHORT TERM GOAL #1   Title Pt will be I and compliant with initial HEP.    Time 2    Period Weeks    Status New    Target Date 12/02/20             PT Long Term Goals - 11/18/20 2242      PT LONG TERM GOAL #1   Title Pt will return to walking for 45 minutes as daily exercise not limited by shortness of breath    Time 8    Period Weeks    Status New    Target Date 01/13/21      PT LONG TERM GOAL #2   Title BLE strength increased to grossly 4+/5    Time 8    Status New    Target Date 01/13/21      PT LONG TERM GOAL #3   Title Pt will be able to complete 6 MWT distance of at least 500 m with no greater than 4/10 RPE or Borg Dypsnea scale rating    Time 8    Period Weeks    Status New    Target Date 01/13/21      PT LONG TERM GOAL  #4   Title Pt will score at least 7 points higher than initial berg score to demo decreased falls risk  Time 8    Period Weeks    Status New    Target Date 01/13/21                 Plan - 12/16/20 1322    Clinical Impression Statement Rein tolerated today's exercises and activities nicely with VSS throughout. Continues to require alot of cues for slow controlled movements. Educated in use of incentive spirometer at home as well - may bring in to [place] sessions and will benefit from further instruction.    Personal Factors and Comorbidities Age;Time since onset of injury/illness/exacerbation;Comorbidity 3+;Fitness;Past/Current Experience    Rehab Potential Good    PT Frequency 2x / week    PT Duration 8 weeks    PT Treatment/Interventions ADLs/Self Care Home Management;Neuromuscular re-education;Balance training;Therapeutic exercise;Therapeutic activities;Functional mobility training;Patient/family education;Stair training;Manual techniques;Energy conservation;Taping;Vestibular    PT Next Visit Plan Follow up regarding any lightheaded/dizzy symptoms next visit. Update HEP to more standing exercises/balance as tolerated next visit. Monitor VS as needed. Slowly progress endurance strength and balance as tolerated. Monitor of dyspnea/SOB on exertion and utilize RPE to grade activity    Consulted and Agree with Plan of Care Patient           Patient will benefit from skilled therapeutic intervention in order to improve the following deficits and impairments:  Abnormal gait,Decreased strength,Improper body mechanics,Postural dysfunction,Difficulty walking,Cardiopulmonary status limiting activity,Decreased activity tolerance,Decreased balance,Pain,Decreased endurance  Visit Diagnosis: Muscle weakness (generalized)  Anoxic brain injury ([place])  Pain in left hip  Stiffness of left hip, not elsewhere classified  Other abnormalities of gait and mobility  Other symptoms and signs  involving the musculoskeletal system     Problem List Patient Active Problem List   Diagnosis Date Noted  . Ischemic cardiomyopathy 10/26/2020  . DM (diabetes mellitus), type 2 ([place]) new 10/26/2020  . Hematuria 10/26/2020  . Urinary retention 10/26/2020  . Iron deficiency anemia rec'd IV iron 10/26/2020  . Anoxic brain injury ([place]) 10/26/2020  . HLD (hyperlipidemia) 10/26/2020  . CAD in native artery residual disease of LAD, may need PCI, treating medically for now 10/26/2020  . S/P angioplasty with stent 10/04/20 DES to LCX  10/26/2020  . Acute on chronic combined systolic and diastolic HF (heart failure) ([place]) 10/26/2020  . Pneumonia of both lungs due to infectious organism   . Acute respiratory failure ([place]), hypothermia therapy, vent - extubated 10/06/20   . Acute blood loss anemia 10/05/2020  . AKI (acute kidney injury) ([place]) 10/05/2020  . STEMI (ST elevation myocardial infarction) ([employer] [place]) 10/04/2020  . Encounter for central line placement   . Encounter for imaging study to confirm orogastric (OG) tube placement   . SOB (shortness of breath)   . Cardiac arrest with ventricular fibrillation ([place])   . Pain of left hip joint 05/29/2020  . Benign brain tumor ([place])   . Chronic kidney disease   . History of pulmonary embolus (PE)   . Hypertension   . Nonsustained ventricular tachycardia ([place]) 10/11/2019  . Dizziness 10/11/2019  . Abnormal echocardiogram 06/07/2019  . Pulmonary hypertension due to thromboembolism ([place]) 03/09/2018  . Solitary pulmonary nodule on lung CT 03/08/2018  . Ascending aortic aneurysm (HCC) 4.2 cm based on CT from December 2020 01/29/2018  . Sinus bradycardia 12/22/2017  . Left bundle branch block 12/19/2017  . Personal history of pulmonary embolism 12/19/2017  . Chronic kidney disease, stage III (moderate) ([place]) 12/19/2017  . Benign neoplasm of brain ([place]) 12/19/2017  . Chest pain 12/04/2017    [facility]  L Hollan Philipp, PT, DPT 12/16/2020, 2:01 PM  [place]. [place], [place], [address] Phone: [phone]   Fax:  [phone]  Name: Detravion Tester MRN: [id number] Date of Birth: 05-Apr-1946

## 2020-12-16 NOTE — Addendum Note (Signed)
Addended by: Gar Ponto on: 12/16/2020 09:21 AM   Modules accepted: Orders

## 2020-12-16 NOTE — Telephone Encounter (Signed)
Patient was calling to make sure its okay for him to get his 2nd booster of the covid shot. Please advise

## 2020-12-17 ENCOUNTER — Other Ambulatory Visit: Payer: Self-pay | Admitting: Physician Assistant

## 2020-12-17 NOTE — Telephone Encounter (Signed)
Patient informed that from Dr. Wendy Poet viewpoint it is fine to get booster he understood no further questions.

## 2020-12-17 NOTE — Telephone Encounter (Signed)
It is fine from my point of view to get booster

## 2020-12-22 ENCOUNTER — Ambulatory Visit: Payer: Medicare Other

## 2020-12-22 ENCOUNTER — Other Ambulatory Visit: Payer: Self-pay

## 2020-12-22 DIAGNOSIS — M25652 Stiffness of left hip, not elsewhere classified: Secondary | ICD-10-CM

## 2020-12-22 DIAGNOSIS — R2689 Other abnormalities of gait and mobility: Secondary | ICD-10-CM

## 2020-12-22 DIAGNOSIS — M6281 Muscle weakness (generalized): Secondary | ICD-10-CM

## 2020-12-22 DIAGNOSIS — M25552 Pain in left hip: Secondary | ICD-10-CM

## 2020-12-22 DIAGNOSIS — G931 Anoxic brain damage, not elsewhere classified: Secondary | ICD-10-CM

## 2020-12-22 DIAGNOSIS — R29898 Other symptoms and signs involving the musculoskeletal system: Secondary | ICD-10-CM | POA: Diagnosis not present

## 2020-12-22 NOTE — Therapy (Signed)
[place]. [place], [place], [address] Phone: [phone]   Fax:  [phone]  Physical Therapy Treatment  Patient Details  Name: Timothy Moran MRN: [id number] Date of Birth: Aug 05, 1946 Referring Provider (PT): Reesa Chew PA-C   Encounter Date: 12/22/2020   PT End of Session - 12/22/20 1331    Visit Number 7    Number of Visits 17    Date for PT Re-Evaluation 01/13/21    Authorization Type Medicare    PT Start Time 1315    PT Stop Time 1400    PT Time Calculation (min) 45 min    Equipment Utilized During Treatment Gait belt    Activity Tolerance Patient tolerated treatment well    Behavior During Therapy [facility] for tasks assessed/performed           Past Medical History:  Diagnosis Date  . Abnormal echocardiogram 06/07/2019  . Acute on chronic combined systolic and diastolic HF (heart failure) ([place]) 10/26/2020  . Anoxic brain injury ([place]) 10/26/2020  . Arrhythmia   . Ascending aortic aneurysm (HCC) 4.2 cm based on CT from December 2020 01/29/2018   4.3 cm by echo in 2019  . Benign brain tumor ([place])   . Benign neoplasm of brain ([place]) 12/19/2017  . CAD in native artery residual disease of LAD, may need PCI, treating medically for now 10/26/2020  . Chest pain 12/04/2017  . Chronic kidney disease    Stage 3  . Chronic kidney disease, stage III (moderate) ([place]) 12/19/2017  . Diabetes mellitus, type 2 ([place]) 10/06/2020   10/06/20 A1C 7%  . Dizziness 10/11/2019  . Hematuria 10/26/2020  . History of pulmonary embolus (PE)   . Hypertension   . Iron deficiency anemia rec'd IV iron 10/26/2020  . Ischemic cardiomyopathy 10/26/2020  . Left bundle branch block 12/19/2017  . Nonsustained ventricular tachycardia ([place]) 10/11/2019  . Pain of left hip joint 05/29/2020  . Personal history of pulmonary embolism 12/19/2017  . Pulmonary hypertension due to thromboembolism ([place]) 03/09/2018   See echo 12/27/17 with nl PAS vs CTa chest  02/23/18   . S/P angioplasty with stent 10/04/20 DES to LCX  10/26/2020  . Sinus bradycardia 12/22/2017  . Solitary pulmonary nodule on lung CT 03/08/2018   CT 12/04/17 1.0 x 0.8 x 0.7 cm nodular opacity in the RUL vs not seen 03/16/10 posterior segment of the right upper lobe. [place] KitchenSpirometry 03/08/2018    FEV1 3.86 (108%)  Ratio 96 s prior rx  - PET  03/13/18   Low grade c/w adenoca > rec T surgery eval    . Urinary retention 10/26/2020    Past Surgical History:  Procedure Laterality Date  . APPENDECTOMY    . CORONARY/GRAFT ACUTE MI REVASCULARIZATION N/A 10/04/2020   Procedure: Coronary/Graft Acute MI Revascularization;  Surgeon: Nelva Bush, MD;  Location: [place] [facility];  Service: Cardiovascular;  Laterality: N/A;  . HERNIA REPAIR    . LEFT HEART CATH AND CORONARY ANGIOGRAPHY N/A 10/04/2020   Procedure: LEFT HEART CATH AND CORONARY ANGIOGRAPHY;  Surgeon: Nelva Bush, MD;  Location: [place] [facility];  Service: Cardiovascular;  Laterality: N/A;    There were no vitals filed for this visit.   Subjective Assessment - 12/22/20 1317    Subjective reports having consistent dizziness everyday. meclizine seems to help. Feel it most when he is moving. when outside pulling weeds this morning had a hard time standing up    Pertinent History HTN, arrhythmia, hx  of pulmonary embolus, meningiomas, stage III CKD, R upper and lower lobe lung nodules, ascending aortic aneurysm, and L bundle branch block    Patient Stated Goals Getting back to normal - doing activities around the house, yard work. Improve endurance strength and balance.    Currently in Pain? No/denies                   Vestibular Assessment - 12/22/20 0001      Symptom Behavior   Type of Dizziness  Imbalance;Unsteady with head/body turns;"Funny feeling in head"    Duration of Dizziness variable, 30 sec to several minutes      Vestibulo-Ocular Reflex   VOR 1 Head Only (x 1 viewing) increased dizziness horizontal. no  dizziness vertical.                    [facility] Adult PT Treatment/Exercise - 12/22/20 0001      High Level Balance   High Level Balance Activities Marching forwards;Marching backwards;Tandem walking;Turns      Therapeutic Activites    Therapeutic Activities Other Therapeutic Activities    Other Therapeutic Activities VS monitoring: initial 102/70 HR 66, O2 98. VSS throughout session. Therapeutic rest breaks provided throuhgout session to manage fatigue and monitor for ay lightheadedness.      Lumbar Exercises: Aerobic   UBE (Upper Arm Bike) L1 - 2 min fwd, 2 min bwd   3/10 RPE   Nustep L3 x 6 minutes   3/10 RPE     Lumbar Exercises: Standing   Row Strengthening;Both;10 reps   red 2 sets   Other Standing Lumbar Exercises Sit to stand: 5 x 2 from chair.  5 x 2 from high perch - holding small playground ball. 5-6/10 RPE      Knee/Hip Exercises: Standing   Knee Flexion Strengthening;Both;5 reps;3 sets    Hip Abduction Stengthening;Both;3 sets;5 reps   2#   Hip Extension Stengthening;1 set;5 reps;Both;Knee straight   BUE supported, 2#     Knee/Hip Exercises: Seated   Long Arc Quad Strengthening;Both;2 sets;15 reps    Long Arc Quad Weight 3 lbs.    Marching Strengthening;Both;2 sets;10 reps    Marching Weights 3 lbs.                           Vestibular Treatment/Exercise - 12/22/20 0001      Vestibular Treatment/Exercise   Vestibular Treatment Provided Gaze    Habituation Exercises Seated Horizontal Head Turns;Comment    Gaze Exercises X1 Viewing Horizontal;Eye/Head Exercise Horizontal;Comment      Seated Horizontal Head Turns   Number of Reps  5      Eye/Head Exercise Horizontal   Comments 5 x 3 head turns standing in  bars with CGA, nausea reported after 3rd set.      Eye/Head Exercise Vertical   Comment forward bends partial range x 10, x10 full range              Balance Exercises - 12/22/20 0001      Balance Exercises: Standing   Gait with Head  Turns Forward   traled - unable - significant instabiltiy and dizziness.   Step Over Hurdles / Cones x 7 laps, CS to CGA - intermittnet dizziness and instability               PT Short Term Goals - 11/18/20 2242      PT SHORT TERM GOAL #1  Title Pt will be I and compliant with initial HEP.    Time 2    Period Weeks    Status New    Target Date 12/02/20             PT Long Term Goals - 11/18/20 2242      PT LONG TERM GOAL #1   Title Pt will return to walking for 45 minutes as daily exercise not limited by shortness of breath    Time 8    Period Weeks    Status New    Target Date 01/13/21      PT LONG TERM GOAL #2   Title BLE strength increased to grossly 4+/5    Time 8    Status New    Target Date 01/13/21      PT LONG TERM GOAL #3   Title Pt will be able to complete 6 MWT distance of at least 500 m with no greater than 4/10 RPE or Borg Dypsnea scale rating    Time 8    Period Weeks    Status New    Target Date 01/13/21      PT LONG TERM GOAL #4   Title Pt will score at least 7 points higher than initial berg score to demo decreased falls risk    Time 8    Period Weeks    Status New    Target Date 01/13/21                 Plan - 12/22/20 1332    Clinical Impression Statement Timothy Moran continues to tolerate TE well. More limited by dizziness today. Dizzines brought on most with horizontal VOR x 1, bend forward in sitting, and with transitions into standing. Educated on habituation exercises to work on decreasing dizziness, imbalance with fair tolerance, some nausea with repeated head turn exercises. Symptoms dizzipated after 30 sec to 2 min rest breaks - educated pt on monitoring for tolerance post session and report back to PT how he felt after today next sessions, pt VU and end of session with cold water and seated rest break with visual fixatiion to diminish symptoms. Pt demo'd minimal instability , report no sx end of session.    Personal Factors and  Comorbidities Age;Time since onset of injury/illness/exacerbation;Comorbidity 3+;Fitness;Past/Current Experience    Rehab Potential Good    PT Frequency 2x / week    PT Duration 8 weeks    PT Treatment/Interventions ADLs/Self Care Home Management;Neuromuscular re-education;Balance training;Therapeutic exercise;Therapeutic activities;Functional mobility training;Patient/family education;Stair training;Manual techniques;Energy conservation;Taping;Vestibular    PT Next Visit Plan Follow up regarding any lightheaded/dizzy symptoms next visit. Update HEP to more standing exercises/balance as tolerated next visit. Monitor VS as needed. Slowly progress endurance strength and balance as tolerated. Monitor of dyspnea/SOB on exertion and utilize RPE to grade activity    Consulted and Agree with Plan of Care Patient           Patient will benefit from skilled therapeutic intervention in order to improve the following deficits and impairments:  Abnormal gait,Decreased strength,Improper body mechanics,Postural dysfunction,Difficulty walking,Cardiopulmonary status limiting activity,Decreased activity tolerance,Decreased balance,Pain,Decreased endurance  Visit Diagnosis: Muscle weakness (generalized)  Anoxic brain injury ([place])  Pain in left hip  Stiffness of left hip, not elsewhere classified  Other abnormalities of gait and mobility  Other symptoms and signs involving the musculoskeletal system  Poor body mechanics     Problem List Patient Active Problem List   Diagnosis Date Noted  . Ischemic cardiomyopathy  10/26/2020  . DM (diabetes mellitus), type 2 ([place]) new 10/26/2020  . Hematuria 10/26/2020  . Urinary retention 10/26/2020  . Iron deficiency anemia rec'd IV iron 10/26/2020  . Anoxic brain injury ([place]) 10/26/2020  . HLD (hyperlipidemia) 10/26/2020  . CAD in native artery residual disease of LAD, may need PCI, treating medically for now 10/26/2020  . S/P angioplasty with stent 10/04/20  DES to LCX  10/26/2020  . Acute on chronic combined systolic and diastolic HF (heart failure) ([place]) 10/26/2020  . Pneumonia of both lungs due to infectious organism   . Acute respiratory failure ([place]), hypothermia therapy, vent - extubated 10/06/20   . Acute blood loss anemia 10/05/2020  . AKI (acute kidney injury) ([place]) 10/05/2020  . STEMI (ST elevation myocardial infarction) ([place]) 10/04/2020  . Encounter for central line placement   . Encounter for imaging study to confirm orogastric (OG) tube placement   . SOB (shortness of breath)   . Cardiac arrest with ventricular fibrillation ([place])   . Pain of left hip joint 05/29/2020  . Benign brain tumor ([place])   . Chronic kidney disease   . History of pulmonary embolus (PE)   . Hypertension   . Nonsustained ventricular tachycardia ([place]) 10/11/2019  . Dizziness 10/11/2019  . Abnormal echocardiogram 06/07/2019  . Pulmonary hypertension due to thromboembolism ([place]) 03/09/2018  . Solitary pulmonary nodule on lung CT 03/08/2018  . Ascending aortic aneurysm (HCC) 4.2 cm based on CT from December 2020 01/29/2018  . Sinus bradycardia 12/22/2017  . Left bundle branch block 12/19/2017  . Personal history of pulmonary embolism 12/19/2017  . Chronic kidney disease, stage III (moderate) ([place]) 12/19/2017  . Benign neoplasm of brain ([place]) 12/19/2017  . Chest pain 12/04/2017    Hall Busing , PT, DPT 12/22/2020, 2:15 PM  [address]. [place], [place], [address] Phone: [phone]   Fax:  [phone]  Name: Timothy Moran MRN: [id number] Date of Birth: Feb 02, 1946

## 2020-12-24 ENCOUNTER — Telehealth: Payer: Self-pay | Admitting: Cardiology

## 2020-12-24 ENCOUNTER — Other Ambulatory Visit: Payer: Self-pay

## 2020-12-24 ENCOUNTER — Ambulatory Visit: Payer: Medicare Other

## 2020-12-24 ENCOUNTER — Inpatient Hospital Stay: Payer: Medicare Other | Admitting: Physical Medicine and Rehabilitation

## 2020-12-24 DIAGNOSIS — R2689 Other abnormalities of gait and mobility: Secondary | ICD-10-CM | POA: Diagnosis not present

## 2020-12-24 DIAGNOSIS — R29898 Other symptoms and signs involving the musculoskeletal system: Secondary | ICD-10-CM | POA: Diagnosis not present

## 2020-12-24 DIAGNOSIS — M6281 Muscle weakness (generalized): Secondary | ICD-10-CM | POA: Diagnosis not present

## 2020-12-24 DIAGNOSIS — Z23 Encounter for immunization: Secondary | ICD-10-CM | POA: Diagnosis not present

## 2020-12-24 DIAGNOSIS — G931 Anoxic brain damage, not elsewhere classified: Secondary | ICD-10-CM

## 2020-12-24 DIAGNOSIS — M25552 Pain in left hip: Secondary | ICD-10-CM

## 2020-12-24 DIAGNOSIS — M25652 Stiffness of left hip, not elsewhere classified: Secondary | ICD-10-CM | POA: Diagnosis not present

## 2020-12-24 NOTE — Therapy (Signed)
[address]. [place], [place], [address] Phone: [phone]   Fax:  [phone]  Physical Therapy Treatment  Patient Details  Name: Timothy Moran MRN: [id number] Date of Birth: [birth year] Referring Provider (PT): Reesa Chew PA-C   Encounter Date: 12/24/2020   PT End of Session - 12/24/20 1403    Visit Number 8    Number of Visits 17    Date for PT Re-Evaluation 01/13/21    Authorization Type Medicare    PT Start Time 1315    PT Stop Time 1356    PT Time Calculation (min) 41 min    Equipment Utilized During Treatment Gait belt    Activity Tolerance Patient tolerated treatment well    Behavior During Therapy [facility] for tasks assessed/performed           Past Medical History:  Diagnosis Date  . Abnormal echocardiogram 06/07/2019  . Acute on chronic combined systolic and diastolic HF (heart failure) ([place]) 10/26/2020  . Anoxic brain injury ([place]) 10/26/2020  . Arrhythmia   . Ascending aortic aneurysm (HCC) 4.2 cm based on CT from December 2020 01/29/2018   4.3 cm by echo in 2019  . Benign brain tumor ([place])   . Benign neoplasm of brain ([place]) 12/19/2017  . CAD in native artery residual disease of LAD, may need PCI, treating medically for now 10/26/2020  . Chest pain 12/04/2017  . Chronic kidney disease    Stage 3  . Chronic kidney disease, stage III (moderate) ([place]) 12/19/2017  . Diabetes mellitus, type 2 ([place]) 10/06/2020   10/06/20 A1C 7%  . Dizziness 10/11/2019  . Hematuria 10/26/2020  . History of pulmonary embolus (PE)   . Hypertension   . Iron deficiency anemia rec'd IV iron 10/26/2020  . Ischemic cardiomyopathy 10/26/2020  . Left bundle branch block 12/19/2017  . Nonsustained ventricular tachycardia ([place]) 10/11/2019  . Pain of left hip joint 05/29/2020  . Personal history of pulmonary embolism 12/19/2017  . Pulmonary hypertension due to thromboembolism ([place]) 03/09/2018   See echo 12/27/17 with nl PAS vs CTa chest  02/23/18   . S/P angioplasty with stent 10/04/20 DES to LCX  10/26/2020  . Sinus bradycardia 12/22/2017  . Solitary pulmonary nodule on lung CT 03/08/2018   CT 12/04/17 1.0 x 0.8 x 0.7 cm nodular opacity in the RUL vs not seen 03/16/10 posterior segment of the right upper lobe. [place] KitchenSpirometry 03/08/2018    FEV1 3.86 (108%)  Ratio 96 s prior rx  - PET  03/13/18   Low grade c/w adenoca > rec T surgery eval    . Urinary retention 10/26/2020    Past Surgical History:  Procedure Laterality Date  . APPENDECTOMY    . CORONARY/GRAFT ACUTE MI REVASCULARIZATION N/A 10/04/2020   Procedure: Coronary/Graft Acute MI Revascularization;  Surgeon: Nelva Bush, MD;  Location: [place] [facility];  Service: Cardiovascular;  Laterality: N/A;  . HERNIA REPAIR    . LEFT HEART CATH AND CORONARY ANGIOGRAPHY N/A 10/04/2020   Procedure: LEFT HEART CATH AND CORONARY ANGIOGRAPHY;  Surgeon: Nelva Bush, MD;  Location: [place] [facility];  Service: Cardiovascular;  Laterality: N/A;    There were no vitals filed for this visit.   Subjective Assessment - 12/24/20 1322    Subjective reports having consistent dizziness everyday. meclizine seems to help. Feel it most when he is moving. when outside pulling weeds this morning had a hard time standing up    Pertinent History HTN, arrhythmia, hx  of pulmonary embolus, meningiomas, stage III CKD, R upper and lower lobe lung nodules, ascending aortic aneurysm, and L bundle branch block    Patient Stated Goals Getting back to normal - doing activities around the house, yard work. Improve endurance strength and balance.             [facility] Adult PT Treatment/Exercise - 12/24/20 0001      Therapeutic Activites    Other Therapeutic Activities VS monitoring: initial 123/65, HR 65, pulse strong/normal, regular. 117/67 67 bpm. VSS throughout session. Therapeutic rest breaks provided throuhgout session to manage fatigue and monitor for ay lightheadedness.      Lumbar Exercises: Aerobic    UBE (Upper Arm Bike) L1 - 2 min fwd, 2 min bwd    Nustep L3 x 5 min      Lumbar Exercises: Standing   Other Standing Lumbar Exercises Sit to stand: 5 x 2 from mat.  Fatigued      Knee/Hip Exercises: Seated   Long Arc Quad Strengthening;Both;2 sets;15 reps    Long Arc [employer] --   some wooziness after trial of 1st set - no weight seconds set         Vestibular screen:  No nystagmus noted at rest or with smoothpursuits. Saccades WNL asymptomatic. C/o dizziness with head turns and with moving/walking in standing.      PT Short Term Goals - 12/24/20 1542      PT SHORT TERM GOAL #1   Title Pt will be I and compliant with initial HEP.    Time 2    Period Weeks    Status Achieved    Target Date 12/02/20             PT Long Term Goals - 11/18/20 2242      PT LONG TERM GOAL #1   Title Pt will return to walking for 45 minutes as daily exercise not limited by shortness of breath    Time 8    Period Weeks    Status New    Target Date 01/13/21      PT LONG TERM GOAL #2   Title BLE strength increased to grossly 4+/5    Time 8    Status New    Target Date 01/13/21      PT LONG TERM GOAL #3   Title Pt will be able to complete 6 MWT distance of at least 500 m with no greater than 4/10 RPE or Borg Dypsnea scale rating    Time 8    Period Weeks    Status New    Target Date 01/13/21      PT LONG TERM GOAL #4   Title Pt will score at least 7 points higher than initial berg score to demo decreased falls risk    Time 8    Period Weeks    Status New    Target Date 01/13/21                 Plan - 12/24/20 1350    Clinical Impression Statement Todays session limited by symptoms of imbalance,lightheadedness. With effort he would c/o some nausea, head pressure. Blood pressure, O2 and HR grossly stable throughout session despite symptoms reported . End of session 126/74 HR 71 bpm, O2 98%. He mentioned during session he was supposed to see neuro in february for radiation  but that was cancelled since he was in hospital. Advised patient and pt wife to follow up with neurologist and  cardiologist regarding worsening persistent symptoms of dizziness and lightheadedness, and episode of SOB when laying down at night.    Personal Factors and Comorbidities Age;Time since onset of injury/illness/exacerbation;Comorbidity 3+;Fitness;Past/Current Experience    Rehab Potential Good    PT Frequency 2x / week    PT Duration 8 weeks    PT Treatment/Interventions ADLs/Self Care Home Management;Neuromuscular re-education;Balance training;Therapeutic exercise;Therapeutic activities;Functional mobility training;Patient/family education;Stair training;Manual techniques;Energy conservation;Taping;Vestibular    PT Next Visit Plan Follow up regarding pt symptoms and follow up with cardio/neuro. Modify PT POC as needed once pt follows up wth specialists.  Update HEP to more standing exercises/balance as tolerated next visit. Monitor VS as needed. Slowly progress endurance strength and balance as tolerated. Monitor of dyspnea/SOB on exertion and utilize RPE to grade activity    Consulted and Agree with Plan of Care Patient;Family member/caregiver    Family Member Consulted Wife, Delcie Roch           Patient will benefit from skilled therapeutic intervention in order to improve the following deficits and impairments:  Abnormal gait,Decreased strength,Improper body mechanics,Postural dysfunction,Difficulty walking,Cardiopulmonary status limiting activity,Decreased activity tolerance,Decreased balance,Pain,Decreased endurance  Visit Diagnosis: Muscle weakness (generalized)  Anoxic brain injury ([place])  Pain in left hip  Stiffness of left hip, not elsewhere classified  Other abnormalities of gait and mobility     Problem List Patient Active Problem List   Diagnosis Date Noted  . Ischemic cardiomyopathy 10/26/2020  . DM (diabetes mellitus), type 2 ([place]) new 10/26/2020  . Hematuria  10/26/2020  . Urinary retention 10/26/2020  . Iron deficiency anemia rec'd IV iron 10/26/2020  . Anoxic brain injury ([place]) 10/26/2020  . HLD (hyperlipidemia) 10/26/2020  . CAD in native artery residual disease of LAD, may need PCI, treating medically for now 10/26/2020  . S/P angioplasty with stent 10/04/20 DES to LCX  10/26/2020  . Acute on chronic combined systolic and diastolic HF (heart failure) ([place]) 10/26/2020  . Pneumonia of both lungs due to infectious organism   . Acute respiratory failure ([place]), hypothermia therapy, vent - extubated 10/06/20   . Acute blood loss anemia 10/05/2020  . AKI (acute kidney injury) ([place]) 10/05/2020  . STEMI (ST elevation myocardial infarction) ([place]) 10/04/2020  . Encounter for central line placement   . Encounter for imaging study to confirm orogastric (OG) tube placement   . SOB (shortness of breath)   . Cardiac arrest with ventricular fibrillation ([place])   . Pain of left hip joint 05/29/2020  . Benign brain tumor ([place])   . Chronic kidney disease   . History of pulmonary embolus (PE)   . Hypertension   . Nonsustained ventricular tachycardia ([place]) 10/11/2019  . Dizziness 10/11/2019  . Abnormal echocardiogram 06/07/2019  . Pulmonary hypertension due to thromboembolism ([place]) 03/09/2018  . Solitary pulmonary nodule on lung CT 03/08/2018  . Ascending aortic aneurysm (HCC) 4.2 cm based on CT from December 2020 01/29/2018  . Sinus bradycardia 12/22/2017  . Left bundle branch block 12/19/2017  . Personal history of pulmonary embolism 12/19/2017  . Chronic kidney disease, stage III (moderate) ([place]) 12/19/2017  . Benign neoplasm of brain ([place]) 12/19/2017  . Chest pain 12/04/2017    Hall Busing, PT, DPT 12/24/2020, 3:48 PM  [address] [place], [place], [address] Phone: [phone]   Fax:  [phone]  Name: Kanden Carey MRN: [id number] Date of Birth: 09-18-1945

## 2020-12-24 NOTE — Telephone Encounter (Signed)
Pt c/o Shortness Of Breath: STAT if SOB developed within the last 24 hours or pt is noticeably SOB on the phone  1. Are you currently SOB (can you hear that pt is SOB on the phone)? Yes, I can not hear anything over the phone  2. How long have you been experiencing SOB? Within the last week  3. Are you SOB when sitting or when up moving around? Moving around  4. Are you currently experiencing any other symptoms? Chest pain  Pt c/o of Chest Pain: STAT if CP now or developed within 24 hours  1. Are you having CP right now? No  2. Are you experiencing any other symptoms (ex. SOB, nausea, vomiting, sweating)? sob  3. How long have you been experiencing CP? Short period of time/lastnight  4. Is your CP continuous or coming and going? Just one time/lastnight  5. Have you taken Nitroglycerin? No wasn't bad enough for Nitroglycerin ?

## 2020-12-24 NOTE — Telephone Encounter (Signed)
Spoke to the patient just now and he let me know that he had an episode of chest pain last night 2-3 minutes. It felt like a sharp pain but it went away on its own without any medications.   His main concern right now is his shortness of breath and he states that this shortness of breath is getting worse. He states that the shortness of breath happens with activity and also when he is laying down in bed. He gets dizzy when standing up. When he is sitting down he feels fine.   His blood pressure this morning was 125/70 while exercising with PT today. He states that he is averaging around 100/60 on other days/times.   His blood pressure right now is 122/70 sitting and 98/67.   I will route to [name] to get his recommendations from here.

## 2020-12-25 NOTE — Telephone Encounter (Signed)
Patient scheduled for Monday. Advised patient if things get worse over thew weekend to go to the emergency department.

## 2020-12-28 ENCOUNTER — Ambulatory Visit (INDEPENDENT_AMBULATORY_CARE_PROVIDER_SITE_OTHER): Payer: Medicare Other | Admitting: Cardiology

## 2020-12-28 ENCOUNTER — Other Ambulatory Visit: Payer: Self-pay

## 2020-12-28 VITALS — BP 110/74 | HR 70 | Ht 73.0 in | Wt 216.0 lb

## 2020-12-28 DIAGNOSIS — I7121 Aneurysm of the ascending aorta, without rupture: Secondary | ICD-10-CM

## 2020-12-28 DIAGNOSIS — Z95820 Peripheral vascular angioplasty status with implants and grafts: Secondary | ICD-10-CM | POA: Diagnosis not present

## 2020-12-28 DIAGNOSIS — Z79899 Other long term (current) drug therapy: Secondary | ICD-10-CM | POA: Diagnosis not present

## 2020-12-28 DIAGNOSIS — I712 Thoracic aortic aneurysm, without rupture: Secondary | ICD-10-CM | POA: Diagnosis not present

## 2020-12-28 DIAGNOSIS — I2729 Other secondary pulmonary hypertension: Secondary | ICD-10-CM | POA: Diagnosis not present

## 2020-12-28 DIAGNOSIS — I2510 Atherosclerotic heart disease of native coronary artery without angina pectoris: Secondary | ICD-10-CM

## 2020-12-28 DIAGNOSIS — I469 Cardiac arrest, cause unspecified: Secondary | ICD-10-CM

## 2020-12-28 DIAGNOSIS — R0609 Other forms of dyspnea: Secondary | ICD-10-CM

## 2020-12-28 DIAGNOSIS — I499 Cardiac arrhythmia, unspecified: Secondary | ICD-10-CM | POA: Insufficient documentation

## 2020-12-28 DIAGNOSIS — R0600 Dyspnea, unspecified: Secondary | ICD-10-CM | POA: Diagnosis not present

## 2020-12-28 DIAGNOSIS — I4901 Ventricular fibrillation: Secondary | ICD-10-CM

## 2020-12-28 DIAGNOSIS — I2699 Other pulmonary embolism without acute cor pulmonale: Secondary | ICD-10-CM | POA: Diagnosis not present

## 2020-12-28 MED ORDER — ISOSORBIDE DINITRATE 30 MG PO TABS: 30.0000 mg | ORAL_TABLET | Freq: Two times a day (BID) | ORAL | 1 refills | Status: DC

## 2020-12-28 MED ORDER — METOPROLOL SUCCINATE ER 50 MG PO TB24: 50.0000 mg | ORAL_TABLET | Freq: Every day | ORAL | 1 refills | Status: DC

## 2020-12-28 MED ORDER — HYDRALAZINE HCL 25 MG PO TABS: 25.0000 mg | ORAL_TABLET | Freq: Three times a day (TID) | ORAL | 1 refills | Status: DC

## 2020-12-28 MED ORDER — ATORVASTATIN CALCIUM 80 MG PO TABS: 80.0000 mg | ORAL_TABLET | Freq: Every day | ORAL | 1 refills | Status: DC

## 2020-12-28 MED ORDER — TICAGRELOR 90 MG PO TABS: 90.0000 mg | ORAL_TABLET | Freq: Two times a day (BID) | ORAL | 1 refills | Status: DC

## 2020-12-28 NOTE — Patient Instructions (Signed)
Medication Instructions:  Your physician recommends that you continue on your current medications as directed. Please refer to the Current Medication list given to you today.  *If you need a refill on your cardiac medications before your next appointment, please call your pharmacy*   Lab Work: Your physician recommends that you return for lab work today: bmp, pro bnp  If you have labs (blood work) drawn today and your tests are completely normal, you will receive your results only by: Marland Kitchen MyChart Message (if you have MyChart) OR . A paper copy in the mail If you have any lab test that is abnormal or we need to change your treatment, we will call you to review the results.   Testing/Procedures: None    Follow-Up: At [facility], you and your health needs are our priority.  As part of our continuing mission to provide you with exceptional heart care, we have created designated Provider Care Teams.  These Care Teams include your primary Cardiologist (physician) and Advanced Practice Providers (APPs -  Physician Assistants and Nurse Practitioners) who all work together to provide you with the care you need, when you need it.  We recommend signing up for the patient portal called "MyChart".  Sign up information is provided on this After Visit Summary.  MyChart is used to connect with patients for Virtual Visits (Telemedicine).  Patients are able to view lab/test results, encounter notes, upcoming appointments, etc.  Non-urgent messages can be sent to your provider as well.   To learn more about what you can do with MyChart, go to [email or web address].    Your next appointment:   1 month(s)  The format for your next appointment:   In Person  Provider:   Jenne Campus, MD   Other Instructions

## 2020-12-28 NOTE — Progress Notes (Signed)
Cardiology Office Note:    Date:  12/28/2020   ID:  Timothy Moran, DOB Apr 21, 1946, MRN [id number]  PCP:  Timothy Caraway, MD  Cardiologist:  Timothy Campus, MD    Referring MD: Timothy Caraway, MD   Chief Complaint  Patient presents with  . Shortness of Breath  . Chest Pain  . Dizziness    History of Present Illness:    Timothy Moran is a 75 y.o. male with quite complex past medical history.  Initially I was seeing him because of history of pulmonary emboli.  He did have mild pulmonary hypertension overall seems to be doing well however in January of this year he was trying to shovel the snow started having chest pain went back home and collapsed narrowing around being called he was found to be in V. fib required defibrillation, then he ended up coming to [facility] he was found to have acute STEMI involving the circumflex artery that was addressed with PTCA and drug-eluting stent.  He also have residual 80% stenosis of the mid LAD.  He is end up having diminished ejection fraction in the rate of 25%, he did wear LifeVest however he absolutely hated this and now he is without it. He comes today to my office for follow-up overall he seems to be doing well I am actually honestly find him in a better shape that I expected.  He talks and jokes with me he comes today with his wife.  He denies have any chest pain tightness squeezing pressure burning chest but he described the fact that he is disappointed with speed of his recovery.  He is upset because he cannot work in the garden as much as he want to he gets short of breath quite easily also when he gets up he gets dizzy quite easily not to the point of syncope or passing out.  Past Medical History:  Diagnosis Date  . Abnormal echocardiogram 06/07/2019  . Acute on chronic combined systolic and diastolic HF (heart failure) ([place]) 10/26/2020  . Acute respiratory failure ([place]), hypothermia therapy, vent - extubated 10/06/20   . AKI (acute  kidney injury) ([place]) 10/05/2020  . Anoxic brain injury ([place]) 10/26/2020  . Arrhythmia   . Ascending aortic aneurysm (HCC) 4.2 cm based on CT from December 2020 01/29/2018   4.3 cm by echo in 2019  . Benign brain tumor ([place])   . Benign neoplasm of brain ([place]) 12/19/2017  . CAD in native artery residual disease of LAD, may need PCI, treating medically for now 10/26/2020  . Cardiac arrest with ventricular fibrillation ([place])   . Chest pain 12/04/2017  . Chronic kidney disease    Stage 3  . Chronic kidney disease, stage III (moderate) ([place]) 12/19/2017  . Diabetes mellitus, type 2 ([place]) 10/06/2020   10/06/20 A1C 7%  . Dizziness 10/11/2019  . DM (diabetes mellitus), type 2 ([place]) new 10/26/2020  . Hematuria 10/26/2020  . History of pulmonary embolus (PE)   . HLD (hyperlipidemia) 10/26/2020  . Hypertension   . Iron deficiency anemia rec'd IV iron 10/26/2020  . Ischemic cardiomyopathy 10/26/2020  . Left bundle branch block 12/19/2017  . Meningioma ([place]) 08/14/2020  . Nonsustained ventricular tachycardia ([place]) 10/11/2019  . Pain of left hip joint 05/29/2020  . Personal history of pulmonary embolism 12/19/2017  . Pneumonia of both lungs due to infectious organism   . Pulmonary hypertension due to thromboembolism ([place]) 03/09/2018   See echo 12/27/17 with nl PAS vs CTa chest 02/23/18   .  S/P angioplasty with stent 10/04/20 DES to LCX  10/26/2020  . Sinus bradycardia 12/22/2017  . Solitary pulmonary nodule on lung CT 03/08/2018   CT 12/04/17 1.0 x 0.8 x 0.7 cm nodular opacity in the RUL vs not seen 03/16/10 posterior segment of the right upper lobe. Marland KitchenSpirometry 03/08/2018    FEV1 3.86 (108%)  Ratio 96 s prior rx  - PET  03/13/18   Low grade c/w adenoca > rec T surgery eval    . STEMI (ST elevation myocardial infarction) ([place]) 10/04/2020  . Urinary retention 10/26/2020  . Vestibular schwannoma ([place]) 08/14/2020    Past Surgical History:  Procedure Laterality Date  . APPENDECTOMY    . CORONARY/GRAFT ACUTE MI REVASCULARIZATION N/A  10/04/2020   Procedure: Coronary/Graft Acute MI Revascularization;  Surgeon: Nelva Bush, MD;  Location: [facility];  Service: Cardiovascular;  Laterality: N/A;  . HERNIA REPAIR    . LEFT HEART CATH AND CORONARY ANGIOGRAPHY N/A 10/04/2020   Procedure: LEFT HEART CATH AND CORONARY ANGIOGRAPHY;  Surgeon: Nelva Bush, MD;  Location: [facility];  Service: Cardiovascular;  Laterality: N/A;    Current Medications: Current Meds  Medication Sig  . acetaminophen (TYLENOL) 325 MG tablet Take 1-2 tablets (325-650 mg total) by mouth every 4 (four) hours as needed for mild pain.  Marland Kitchen aspirin 81 MG chewable tablet Chew 1 tablet (81 mg total) by mouth daily.  . B Complex-C (SUPER B COMPLEX PO) Take 1 tablet by mouth daily. Unknown strength  . docusate sodium (COLACE) 100 MG capsule Take 2 capsules (200 mg total) by mouth daily.  . melatonin 5 MG TABS Take 1 tablet (5 mg total) by mouth at bedtime.  . Multiple Vitamin (MULTIVITAMIN WITH MINERALS) TABS tablet Take 1 tablet by mouth daily. (Patient taking differently: Take 1 tablet by mouth daily. Unknown strength)  . nitroGLYCERIN (NITROSTAT) 0.4 MG SL tablet Place 1 tablet (0.4 mg total) under the tongue every 5 (five) minutes as needed for chest pain.  Marland Kitchen senna-docusate (SENOKOT-S) 8.6-50 MG tablet Take 1 tablet by mouth at bedtime.  . sodium bicarbonate 650 MG tablet Take 2 tablets (1,300 mg total) by mouth 3 (three) times daily.  . tamsulosin (FLOMAX) 0.4 MG CAPS capsule Take 1 capsule (0.4 mg total) by mouth daily.  . traZODone (DESYREL) 50 MG tablet Take 1 tablet (50 mg total) by mouth at bedtime.  . [DISCONTINUED] atorvastatin (LIPITOR) 80 MG tablet Take 1 tablet (80 mg total) by mouth daily.  . [DISCONTINUED] hydrALAZINE (APRESOLINE) 25 MG tablet Take 1 tablet (25 mg total) by mouth every 8 (eight) hours.  . [DISCONTINUED] isosorbide dinitrate (ISORDIL) 30 MG tablet Take 1 tablet (30 mg total) by mouth 2 (two) times daily.  .  [DISCONTINUED] metoprolol succinate (TOPROL-XL) 50 MG 24 hr tablet Take 1 tablet (50 mg total) by mouth daily. Take with or immediately following a meal.  . [DISCONTINUED] ticagrelor (BRILINTA) 90 MG TABS tablet Take 1 tablet (90 mg total) by mouth 2 (two) times daily.     Allergies:   Patient has no known allergies.   Social History   Socioeconomic History  . Marital status: Married    Spouse name: Jermon Chalfant   . Number of children: 1  . Years of education: Not on file  . Highest education level: Not on file  Occupational History  . Not on file  Tobacco Use  . Smoking status: Former Smoker    Packs/day: 0.50    Years: 5.00  Pack years: 2.50    Types: Cigarettes    Quit date: 09/12/1968    Years since quitting: 52.3  . Smokeless tobacco: Never Used  Vaping Use  . Vaping Use: Never used  Substance and Sexual Activity  . Alcohol use: Yes    Comment: ONE PER WEEK  . Drug use: Never  . Sexual activity: Not on file  Other Topics Concern  . Not on file  Social History Narrative  . Not on file   Social Determinants of Health   Financial Resource Strain: Not on file  Food Insecurity: Not on file  Transportation Needs: Not on file  Physical Activity: Not on file  Stress: Not on file  Social Connections: Not on file     Family History: The patient's family history includes Brain cancer in his sister; Diabetes in his mother; Leukemia in his brother; Melanoma in his brother. ROS:   Please see the history of present illness.    All 14 point review of systems negative except as described per history of present illness  EKGs/Labs/Other Studies Reviewed:      Recent Labs: 10/06/2020: TSH 3.917 10/08/2020: B Natriuretic Peptide 1,434.0 10/12/2020: Magnesium 2.2 10/30/2020: ALT 50 11/02/2020: Hemoglobin 9.0; Platelets 284 11/18/2020: BUN 27; Creatinine, Ser 1.95; Potassium 4.8; Sodium 139  Recent Lipid Panel    Component Value Date/Time   CHOL 114 10/06/2020 0317    CHOL 247 (H) 06/01/2020 0858   TRIG 163 (H) 10/06/2020 0317   HDL 39 (L) 10/06/2020 0317   HDL 33 (L) 06/01/2020 0858   CHOLHDL 2.9 10/06/2020 0317   VLDL 33 10/06/2020 0317   LDLCALC 42 10/06/2020 0317   LDLCALC 151 (H) 06/01/2020 0858    Physical Exam:    VS:  BP 110/74 (BP Location: Left Arm, Patient Position: Sitting)   Pulse 70   Ht 6\' 1"  (1.854 m)   Wt 216 lb (98 kg)   SpO2 96%   BMI 28.50 kg/m     Wt Readings from Last 3 Encounters:  12/28/20 216 lb (98 kg)  12/15/20 221 lb 12.8 oz (100.6 kg)  11/25/20 216 lb (98 kg)     GEN:  Well nourished, well developed in no acute distress HEENT: Normal NECK: No JVD; No carotid bruits LYMPHATICS: No lymphadenopathy CARDIAC: RRR, no murmurs, no rubs, no gallops RESPIRATORY:  Clear to auscultation without rales, wheezing or rhonchi  ABDOMEN: Soft, non-tender, non-distended MUSCULOSKELETAL:  No edema; No deformity  SKIN: Warm and dry LOWER EXTREMITIES: no swelling NEUROLOGIC:  Alert and oriented x 3 PSYCHIATRIC:  Normal affect   ASSESSMENT:    1. Dyspnea on exertion   2. Medication management   3. Cardiac arrest with ventricular fibrillation ([place])   4. CAD in native artery residual disease of LAD, may need PCI, treating medically for now   5. Ascending aortic aneurysm (HCC) 4.2 cm based on CT from December 2020   6. Pulmonary hypertension due to thromboembolism ([place])   7. S/P angioplasty with stent 10/04/20 DES to LCX     PLAN:    In order of problems listed above:  1. Coronary artery disease status post PTCA and stenting of the circumflex artery in face of acute myocardial infarction.  He still gets residual 80% of mid LAD.  However he also does have significant kidney dysfunction.  After his cardiac catheterization he end up having acute kidney injury with residual creatinine in the neighborhood of 1.9..  Plan is to repeat his Chem-7 today and  based on that decide if we need to get his LAD fixed. 2. Cardiomyopathy with  ejection fraction of 25%.  He is on all appropriate medication that he can tolerate.  The biggest obstacle is blood pressure being low as well as his kidney function being diminished.  We will continue present medications.  I warned him about potential orthostatic hypotension.  He can also read about salt reduction as well as being careful with the fluids.  On the physical exam I do not see any evidence of decompensation he is euvolemic. 3. Pulmonary hypertension stable noted. 4. Dyslipidemia he is on statin which I will continue. 5. Need for ICD I brought the topic up now because he does have urinary retention and indwelling urine catheter decision has been made to postpone potential ICD implant.  Also I hope in the future he will improve his left ventricle ejection fraction.  We will simply need to continue his medication at appropriate dosages and then repeat his echocardiogram. 6. Overall the plan is to repeat his Chem-7 as well as proBNP today.  I will see him back in about a month at that time most likely will repeat his echocardiogram to recheck left ventricle ejection fraction.   Medication Adjustments/Labs and Tests Ordered: Current medicines are reviewed at length with the patient today.  Concerns regarding medicines are outlined above.  Orders Placed This Encounter  Procedures  . Basic metabolic panel  . Pro b natriuretic peptide (BNP)  . EKG 12-Lead   Medication changes:  Meds ordered this encounter  Medications  . metoprolol succinate (TOPROL-XL) 50 MG 24 hr tablet    Sig: Take 1 tablet (50 mg total) by mouth daily. Take with or immediately following a meal.    Dispense:  90 tablet    Refill:  1  . ticagrelor (BRILINTA) 90 MG TABS tablet    Sig: Take 1 tablet (90 mg total) by mouth 2 (two) times daily.    Dispense:  180 tablet    Refill:  1  . atorvastatin (LIPITOR) 80 MG tablet    Sig: Take 1 tablet (80 mg total) by mouth daily.    Dispense:  90 tablet    Refill:  1  .  hydrALAZINE (APRESOLINE) 25 MG tablet    Sig: Take 1 tablet (25 mg total) by mouth every 8 (eight) hours.    Dispense:  270 tablet    Refill:  1  . isosorbide dinitrate (ISORDIL) 30 MG tablet    Sig: Take 1 tablet (30 mg total) by mouth 2 (two) times daily.    Dispense:  180 tablet    Refill:  1    Signed, Park Liter, MD, [facility] 12/28/2020 4:09 PM    [facility]

## 2020-12-29 ENCOUNTER — Ambulatory Visit: Payer: Medicare Other

## 2020-12-29 DIAGNOSIS — M25652 Stiffness of left hip, not elsewhere classified: Secondary | ICD-10-CM | POA: Diagnosis not present

## 2020-12-29 DIAGNOSIS — M6281 Muscle weakness (generalized): Secondary | ICD-10-CM

## 2020-12-29 DIAGNOSIS — G931 Anoxic brain damage, not elsewhere classified: Secondary | ICD-10-CM

## 2020-12-29 DIAGNOSIS — M25552 Pain in left hip: Secondary | ICD-10-CM | POA: Diagnosis not present

## 2020-12-29 DIAGNOSIS — R29898 Other symptoms and signs involving the musculoskeletal system: Secondary | ICD-10-CM

## 2020-12-29 DIAGNOSIS — R2689 Other abnormalities of gait and mobility: Secondary | ICD-10-CM

## 2020-12-29 LAB — BASIC METABOLIC PANEL
BUN/Creatinine Ratio: 15 (ref 10–24)
BUN: 32 mg/dL — ABNORMAL HIGH (ref 8–27)
CO2: 17 mmol/L — ABNORMAL LOW (ref 20–29)
Calcium: 9.3 mg/dL (ref 8.6–10.2)
Chloride: 107 mmol/L — ABNORMAL HIGH (ref 96–106)
Creatinine, Ser: 2.07 mg/dL — ABNORMAL HIGH (ref 0.76–1.27)
Glucose: 109 mg/dL — ABNORMAL HIGH (ref 65–99)
Potassium: 4.7 mmol/L (ref 3.5–5.2)
Sodium: 141 mmol/L (ref 134–144)
eGFR: 32 mL/min/{1.73_m2} — ABNORMAL LOW (ref >59)

## 2020-12-29 LAB — PRO B NATRIURETIC PEPTIDE: NT-Pro BNP: 2733 pg/mL — ABNORMAL HIGH (ref 0–486)

## 2020-12-29 NOTE — Therapy (Signed)
[facility] [place], [place], [address] Phone: [phone]   Fax:  [phone]  Physical Therapy Treatment  Patient Details  Name: Timothy Moran MRN: [id number] Date of Birth: Jan 13, 1946 Referring Provider (PT): Reesa Chew PA-C   Encounter Date: 12/29/2020   PT End of Session - 12/29/20 1321    Visit Number 9    Number of Visits 17    Date for PT Re-Evaluation 01/13/21    Authorization Type Medicare    PT Start Time 1316    PT Stop Time 1356    PT Time Calculation (min) 40 min    Equipment Utilized During Treatment Gait belt    Activity Tolerance Patient tolerated treatment well    Behavior During Therapy [facility] for tasks assessed/performed           Past Medical History:  Diagnosis Date  . Abnormal echocardiogram 06/07/2019  . Acute on chronic combined systolic and diastolic HF (heart failure) ([place]) 10/26/2020  . Acute respiratory failure ([place]), hypothermia therapy, vent - extubated 10/06/20   . AKI (acute kidney injury) ([place]) 10/05/2020  . Anoxic brain injury ([place]) 10/26/2020  . Arrhythmia   . Ascending aortic aneurysm (HCC) 4.2 cm based on CT from December 2020 01/29/2018   4.3 cm by echo in 2019  . Benign brain tumor ([place])   . Benign neoplasm of brain ([place]) 12/19/2017  . CAD in native artery residual disease of LAD, may need PCI, treating medically for now 10/26/2020  . Cardiac arrest with ventricular fibrillation ([place])   . Chest pain 12/04/2017  . Chronic kidney disease    Stage 3  . Chronic kidney disease, stage III (moderate) ([place]) 12/19/2017  . Diabetes mellitus, type 2 ([place]) 10/06/2020   10/06/20 A1C 7%  . Dizziness 10/11/2019  . DM (diabetes mellitus), type 2 ([place]) new 10/26/2020  . Hematuria 10/26/2020  . History of pulmonary embolus (PE)   . HLD (hyperlipidemia) 10/26/2020  . Hypertension   . Iron deficiency anemia rec'd IV iron 10/26/2020  . Ischemic cardiomyopathy 10/26/2020  . Left bundle branch  block 12/19/2017  . Meningioma ([place]) 08/14/2020  . Nonsustained ventricular tachycardia ([place]) 10/11/2019  . Pain of left hip joint 05/29/2020  . Personal history of pulmonary embolism 12/19/2017  . Pneumonia of both lungs due to infectious organism   . Pulmonary hypertension due to thromboembolism ([place]) 03/09/2018   See echo 12/27/17 with nl PAS vs CTa chest 02/23/18   . S/P angioplasty with stent 10/04/20 DES to LCX  10/26/2020  . Sinus bradycardia 12/22/2017  . Solitary pulmonary nodule on lung CT 03/08/2018   CT 12/04/17 1.0 x 0.8 x 0.7 cm nodular opacity in the RUL vs not seen 03/16/10 posterior segment of the right upper lobe. Marland KitchenSpirometry 03/08/2018    FEV1 3.86 (108%)  Ratio 96 s prior rx  - PET  03/13/18   Low grade c/w adenoca > rec T surgery eval    . STEMI (ST elevation myocardial infarction) ([place]) 10/04/2020  . Urinary retention 10/26/2020  . Vestibular schwannoma ([place]) 08/14/2020    Past Surgical History:  Procedure Laterality Date  . APPENDECTOMY    . CORONARY/GRAFT ACUTE MI REVASCULARIZATION N/A 10/04/2020   Procedure: Coronary/Graft Acute MI Revascularization;  Surgeon: Nelva Bush, MD;  Location: [facility];  Service: Cardiovascular;  Laterality: N/A;  . HERNIA REPAIR    . LEFT HEART CATH AND CORONARY ANGIOGRAPHY N/A 10/04/2020   Procedure: LEFT HEART CATH AND CORONARY  ANGIOGRAPHY;  Surgeon: Nelva Bush, MD;  Location: [facility];  Service: Cardiovascular;  Laterality: N/A;    There were no vitals filed for this visit.   Subjective Assessment - 12/29/20 1320    Subjective Saw cardiologist yesterday, took blood pressure while there and seems to have dropped (orthostatic?)    Pertinent History HTN, arrhythmia, hx of pulmonary embolus, meningiomas, stage III CKD, R upper and lower lobe lung nodules, ascending aortic aneurysm, and L bundle branch block    Patient Stated Goals Getting back to normal - doing activities around the house, yard work. Improve endurance strength  and balance.    Currently in Pain? No/denies                             [facility] Adult PT Treatment/Exercise - 12/29/20 0001      Lumbar Exercises: Aerobic   Recumbent Bike L1.5 x 5 minutes      Lumbar Exercises: Machines for Strengthening   Leg Press 15# x 10, 20# x 10      Lumbar Exercises: Standing   Other Standing Lumbar Exercises Sit to stand: 5 x 3 from mat. a little dizzy/off balance by last set. PLB in sittng and supine x 10, slow and controlled.      Lumbar Exercises: Seated   Other Seated Lumbar Exercises row red tb 10 x 2      Lumbar Exercises: Supine   Clam --   2 x 10 red TB.   Bridge --   2 x 10   Straight Leg Raise --   2 x 10 B     Knee/Hip Exercises: Seated   Other Seated Knee/Hip Exercises heel/toe raises x 20    Marching Strengthening;Both;1 set;10 reps   2 sec holds          Vestibular Treatment/Exercise - 12/29/20 0001      Seated Horizontal Head Turns   Number of Reps  5                   PT Short Term Goals - 12/24/20 1542      PT SHORT TERM GOAL #1   Title Pt will be I and compliant with initial HEP.    Time 2    Period Weeks    Status Achieved    Target Date 12/02/20             PT Long Term Goals - 11/18/20 2242      PT LONG TERM GOAL #1   Title Pt will return to walking for 45 minutes as daily exercise not limited by shortness of breath    Time 8    Period Weeks    Status New    Target Date 01/13/21      PT LONG TERM GOAL #2   Title BLE strength increased to grossly 4+/5    Time 8    Status New    Target Date 01/13/21      PT LONG TERM GOAL #3   Title Pt will be able to complete 6 MWT distance of at least 500 m with no greater than 4/10 RPE or Borg Dypsnea scale rating    Time 8    Period Weeks    Status New    Target Date 01/13/21      PT LONG TERM GOAL #4   Title Pt will score at least 7 points higher than initial berg  score to demo decreased falls risk    Time 8    Period Weeks     Status New    Target Date 01/13/21                 Plan - 12/29/20 1322    Clinical Impression Statement Orthostatics taken today but BP initially low and stayed consistent. Started with supine exercises today. Some left hip pain with SLRs that resolved with rest. Tolerated progression of supine strength exercises to seated and standing/upright machines well with good VS stability today and minimal symptoms.    Personal Factors and Comorbidities Age;Time since onset of injury/illness/exacerbation;Comorbidity 3+;Fitness;Past/Current Experience    Rehab Potential Good    PT Frequency 2x / week    PT Duration 8 weeks    PT Treatment/Interventions ADLs/Self Care Home Management;Neuromuscular re-education;Balance training;Therapeutic exercise;Therapeutic activities;Functional mobility training;Patient/family education;Stair training;Manual techniques;Energy conservation;Taping;Vestibular    PT Next Visit Plan Follow up regarding pt symptoms and follow up with cardio/neuro. Modify PT POC as needed once pt follows up wth specialists.  Update HEP to more standing exercises/balance as tolerated next visit. Monitor VS as needed. Slowly progress endurance strength and balance as tolerated. Monitor of dyspnea/SOB on exertion and utilize RPE to grade activity    Consulted and Agree with Plan of Care Patient;Family member/caregiver    Family Member Consulted Wife, Delcie Roch           Patient will benefit from skilled therapeutic intervention in order to improve the following deficits and impairments:  Abnormal gait,Decreased strength,Improper body mechanics,Postural dysfunction,Difficulty walking,Cardiopulmonary status limiting activity,Decreased activity tolerance,Decreased balance,Pain,Decreased endurance  Visit Diagnosis: Muscle weakness (generalized)  Anoxic brain injury ([place])  Pain in left hip  Stiffness of left hip, not elsewhere classified  Other abnormalities of gait and  mobility  Other symptoms and signs involving the musculoskeletal system     Problem List Patient Active Problem List   Diagnosis Date Noted  . Arrhythmia   . DM (diabetes mellitus), type 2 ([place]) new 10/26/2020  . Hematuria 10/26/2020  . Urinary retention 10/26/2020  . Iron deficiency anemia rec'd IV iron 10/26/2020  . Anoxic brain injury ([place]) 10/26/2020  . HLD (hyperlipidemia) 10/26/2020  . CAD in native artery residual disease of LAD, may need PCI, treating medically for now 10/26/2020  . S/P angioplasty with stent 10/04/20 DES to LCX  10/26/2020  . Acute on chronic combined systolic and diastolic HF (heart failure) ([place]) 10/26/2020  . Pneumonia of both lungs due to infectious organism   . Acute respiratory failure ([place]), hypothermia therapy, vent - extubated 10/06/20   . Diabetes mellitus, type 2 ([place]) 10/06/2020  . Acute blood loss anemia 10/05/2020  . AKI (acute kidney injury) ([place]) 10/05/2020  . STEMI (ST elevation myocardial infarction) ([place]) 10/04/2020  . Encounter for central line placement   . Encounter for imaging study to confirm orogastric (OG) tube placement   . SOB (shortness of breath)   . Meningioma ([place]) 08/14/2020  . Vestibular schwannoma ([place]) 08/14/2020  . Cardiac arrest with ventricular fibrillation ([place])   . Pain of left hip joint 05/29/2020  . Benign brain tumor ([place])   . Chronic kidney disease   . History of pulmonary embolus (PE)   . Hypertension   . Nonsustained ventricular tachycardia ([place]) 10/11/2019  . Dizziness 10/11/2019  . Abnormal echocardiogram 06/07/2019  . Pulmonary hypertension due to thromboembolism ([place]) 03/09/2018  . Solitary pulmonary nodule on lung CT 03/08/2018  . Ascending aortic aneurysm (HCC) 4.2 cm based  on CT from December 2020 01/29/2018  . Sinus bradycardia 12/22/2017  . Left bundle branch block 12/19/2017  . Personal history of pulmonary embolism 12/19/2017  . Chronic kidney disease, stage III (moderate) ([place]) 12/19/2017   . Benign neoplasm of brain ([place]) 12/19/2017  . Chest pain 12/04/2017    Hall Busing, PT, DPT 12/29/2020, 1:58 PM  [address] [place], [place], [address] Phone: [phone]   Fax:  [phone]  Name: Belen Zwahlen MRN: [id number] Date of Birth: [birth year]

## 2020-12-30 ENCOUNTER — Other Ambulatory Visit: Payer: Self-pay | Admitting: Physical Medicine and Rehabilitation

## 2020-12-30 ENCOUNTER — Ambulatory Visit: Payer: Medicare Other | Admitting: Cardiology

## 2020-12-31 ENCOUNTER — Other Ambulatory Visit: Payer: Self-pay

## 2020-12-31 ENCOUNTER — Ambulatory Visit: Payer: Medicare Other

## 2020-12-31 VITALS — BP 107/66

## 2020-12-31 DIAGNOSIS — R2689 Other abnormalities of gait and mobility: Secondary | ICD-10-CM | POA: Diagnosis not present

## 2020-12-31 DIAGNOSIS — M25652 Stiffness of left hip, not elsewhere classified: Secondary | ICD-10-CM | POA: Diagnosis not present

## 2020-12-31 DIAGNOSIS — M6281 Muscle weakness (generalized): Secondary | ICD-10-CM

## 2020-12-31 DIAGNOSIS — G931 Anoxic brain damage, not elsewhere classified: Secondary | ICD-10-CM

## 2020-12-31 DIAGNOSIS — M25552 Pain in left hip: Secondary | ICD-10-CM

## 2020-12-31 DIAGNOSIS — R29898 Other symptoms and signs involving the musculoskeletal system: Secondary | ICD-10-CM

## 2020-12-31 NOTE — Therapy (Signed)
[facility] [place], [place], [address] Phone: [phone]   Fax:  [phone]  Physical Therapy Treatment Progress Note Reporting Period 11/18/20 to 12/31/20  See note below for Objective Data and Assessment of Progress/Goals.       Patient Details  Name: Timothy Moran MRN: [id number] Date of Birth: April 06, 1946 Referring Provider (PT): Reesa Chew PA-C   Encounter Date: 12/31/2020   PT End of Session - 12/31/20 1322    Visit Number 10    Number of Visits 17    Date for PT Re-Evaluation 01/13/21    Authorization Type Medicare    PT Start Time 1315    PT Stop Time 1400    PT Time Calculation (min) 45 min    Equipment Utilized During Treatment Gait belt    Activity Tolerance Patient tolerated treatment well    Behavior During Therapy WFL for tasks assessed/performed           Past Medical History:  Diagnosis Date  . Abnormal echocardiogram 06/07/2019  . Acute on chronic combined systolic and diastolic HF (heart failure) ([place]) 10/26/2020  . Acute respiratory failure ([place]), hypothermia therapy, vent - extubated 10/06/20   . AKI (acute kidney injury) ([place]) 10/05/2020  . Anoxic brain injury ([place]) 10/26/2020  . Arrhythmia   . Ascending aortic aneurysm (HCC) 4.2 cm based on CT from December 2020 01/29/2018   4.3 cm by echo in 2019  . Benign brain tumor ([place])   . Benign neoplasm of brain ([place]) 12/19/2017  . CAD in native artery residual disease of LAD, may need PCI, treating medically for now 10/26/2020  . Cardiac arrest with ventricular fibrillation ([place])   . Chest pain 12/04/2017  . Chronic kidney disease    Stage 3  . Chronic kidney disease, stage III (moderate) ([place]) 12/19/2017  . Diabetes mellitus, type 2 ([place]) 10/06/2020   10/06/20 A1C 7%  . Dizziness 10/11/2019  . DM (diabetes mellitus), type 2 ([place]) new 10/26/2020  . Hematuria 10/26/2020  . History of pulmonary embolus (PE)   . HLD (hyperlipidemia)  10/26/2020  . Hypertension   . Iron deficiency anemia rec'd IV iron 10/26/2020  . Ischemic cardiomyopathy 10/26/2020  . Left bundle branch block 12/19/2017  . Meningioma ([place]) 08/14/2020  . Nonsustained ventricular tachycardia ([place]) 10/11/2019  . Pain of left hip joint 05/29/2020  . Personal history of pulmonary embolism 12/19/2017  . Pneumonia of both lungs due to infectious organism   . Pulmonary hypertension due to thromboembolism ([place]) 03/09/2018   See echo 12/27/17 with nl PAS vs CTa chest 02/23/18   . S/P angioplasty with stent 10/04/20 DES to LCX  10/26/2020  . Sinus bradycardia 12/22/2017  . Solitary pulmonary nodule on lung CT 03/08/2018   CT 12/04/17 1.0 x 0.8 x 0.7 cm nodular opacity in the RUL vs not seen 03/16/10 posterior segment of the right upper lobe. [facility] KitchenSpirometry 03/08/2018    FEV1 3.86 (108%)  Ratio 96 s prior rx  - PET  03/13/18   Low grade c/w adenoca > rec T surgery eval    . STEMI (ST elevation myocardial infarction) ([place]) 10/04/2020  . Urinary retention 10/26/2020  . Vestibular schwannoma ([place]) 08/14/2020    Past Surgical History:  Procedure Laterality Date  . APPENDECTOMY    . CORONARY/GRAFT ACUTE MI REVASCULARIZATION N/A 10/04/2020   Procedure: Coronary/Graft Acute MI Revascularization;  Surgeon: Nelva Bush, MD;  Location: [facility];  Service: Cardiovascular;  Laterality: N/A;  .  HERNIA REPAIR    . LEFT HEART CATH AND CORONARY ANGIOGRAPHY N/A 10/04/2020   Procedure: LEFT HEART CATH AND CORONARY ANGIOGRAPHY;  Surgeon: Nelva Bush, MD;  Location: [facility];  Service: Cardiovascular;  Laterality: N/A;    Vitals:   12/31/20 1735  BP: 107/66     Subjective Assessment - 12/31/20 1322    Subjective Still having some dizziness when up and moving around that hasnt changed. BP was high this morning 135/80s    Pertinent History HTN, arrhythmia, hx of pulmonary embolus, meningiomas, stage III CKD, R upper and lower lobe lung nodules, ascending aortic aneurysm, and  L bundle branch block    Patient Stated Goals Getting back to normal - doing activities around the house, yard work. Improve endurance strength and balance.    Currently in Pain? No/denies              [facility] PT Assessment - 12/31/20 0001      Strength   Overall Strength Comments gross BLE improved to 4/5.      6 Minute walk- Post Test   6 Minute Walk Post Test yes    BP (mmHg) 118/64    HR (bpm) 76    02 Sat (%RA) 95 %      6 minute walk test results    Aerobic Endurance Distance Walked --   15.5 laps x 70 ft= 1085 ft or 330 m                        [facility] Adult PT Treatment/Exercise - 12/31/20 0001      Berg Balance Test   Sit to Stand Able to stand  independently using hands    Standing Unsupported Able to stand safely 2 minutes    Sitting with Back Unsupported but Feet Supported on Floor or Stool Able to sit safely and securely 2 minutes    Stand to Sit Sits safely with minimal use of hands   some knee pain, self seleects use of hands   Transfers Able to transfer safely, definite need of hands    Standing Unsupported with Eyes Closed Able to stand 3 seconds    Standing Ubsupported with Feet Together Able to place feet together independently and stand for 1 minute with supervision    From Standing, Reach Forward with Outstretched Arm Can reach forward >12 cm safely (5")    From Standing Position, Pick up Object from Floor Able to pick up shoe, needs supervision    From Standing Position, Turn to Look Behind Over each Shoulder Looks behind one side only/other side shows less weight shift    Turn 360 Degrees Able to turn 360 degrees safely but slowly    Standing Unsupported, Alternately Place Feet on Step/Stool Able to stand independently and complete 8 steps >20 seconds    Standing Unsupported, One Foot in Front Needs help to step but can hold 15 seconds   small LOB once stopping   Standing on One Leg Unable to try or needs assist to prevent fall    Total Score  38      High Level Balance   High Level Balance Activities Marching forwards;Marching backwards;Turns          Lumbar Exercises: Standing   Other Standing Lumbar Exercises Sit to stand: 5 x 3 from mat. a little dizzy/off balance by last set. PLB in sittng and supine x 10, slow and controlled.  Lumbar Exercises: Supine   Clam --   2 x 10 red TB.   Bridge --   2 x 10   Straight Leg Raise --   2 x 10 B   Other Supine Lumbar Exercises supine  march w/ red TB 10 x 2      Knee/Hip Exercises: Standing   Hip Abduction Both;2 sets;10 reps   2 # AW BUE support   Hip Extension Both;Stengthening;1 set;10 reps   2 # AW   Forward Step Up 1 set;Both;10 reps      Knee/Hip Exercises: Seated   Other Seated Knee/Hip Exercises heel/toe raises x 20    Marching Strengthening;Both;1 set;10 reps   2 sec holds                   PT Short Term Goals - 12/31/20 1325      PT SHORT TERM GOAL #1   Title Pt will be I and compliant with initial HEP.    Status Achieved             PT Long Term Goals - 12/31/20 1325      PT LONG TERM GOAL #1   Title Pt will return to walking for 45 minutes as daily exercise not limited by shortness of breath    Time 8    Period Weeks    Status On-going      PT LONG TERM GOAL #2   Title BLE strength increased to grossly 4+/5    Time 8    Status On-going      PT LONG TERM GOAL #3   Title Pt will be able to complete 6 MWT distance of at least 500 m with no greater than 4/10 RPE or Borg Dypsnea scale rating    Baseline 162 m with 3MWT initial eval. 12/31/20: 330 meters on 6MWT able to tolerate without standing rest, RPE 3-4/10    Time 8    Period Weeks    Status On-going      PT LONG TERM GOAL #4   Title Pt will score at least 7 points higher than initial berg score to demo decreased falls risk    Baseline 30/56 initial. 12/31/20: 38/56.    Time 8    Period Weeks    Status On-going                 Plan - 12/31/20 1323    Clinical  Impression Statement Pt is making fair progress limited most by some suspected orthostatic hypotension and reports of imbalance. Over past few sessions have had to incorporate more seated/supine exercises to manage this. VS have been stable with no significant orthostatic hypotension today. He has made improvements in strength and endurance and is making good progress towards LTGs. Balance continues to be most challenging wiht slow progress noted per the berg and with functional mobility, direction changes. Pt reports he has not followed up with his neurologist and was supposed to get radiation treatment in february which was missed d/t him being hsopitalized - I encouraged him to contact neuro to make sure he has a follow up. He will benefit from continued skilled PT to work on improving endurance strength and balance    Personal Factors and Comorbidities Age;Time since onset of injury/illness/exacerbation;Comorbidity 3+;Fitness;Past/Current Experience    Rehab Potential Good    PT Frequency 2x / week    PT Duration 8 weeks    PT Treatment/Interventions ADLs/Self Care Home Management;Neuromuscular re-education;Balance training;Therapeutic exercise;Therapeutic  activities;Functional mobility training;Patient/family education;Stair training;Manual techniques;Energy conservation;Taping;Vestibular    PT Next Visit Plan Follow up regarding pt symptoms and follow up with cardio/neuro. Modify PT POC as needed once pt follows up wth specialists.  Update HEP to more standing exercises/balance as tolerated next visit. Monitor VS as needed. Slowly progress endurance strength and balance as tolerated. Monitor of dyspnea/SOB on exertion and utilize RPE to grade activity    Consulted and Agree with Plan of Care Patient;Family member/caregiver    Family Member Consulted Wife, Delcie Roch           Patient will benefit from skilled therapeutic intervention in order to improve the following deficits and impairments:   Abnormal gait,Decreased strength,Improper body mechanics,Postural dysfunction,Difficulty walking,Cardiopulmonary status limiting activity,Decreased activity tolerance,Decreased balance,Pain,Decreased endurance  Visit Diagnosis: Muscle weakness (generalized)  Anoxic brain injury ([place])  Pain in left hip  Stiffness of left hip, not elsewhere classified  Other abnormalities of gait and mobility  Other symptoms and signs involving the musculoskeletal system     Problem List Patient Active Problem List   Diagnosis Date Noted  . Arrhythmia   . DM (diabetes mellitus), type 2 ([place]) new 10/26/2020  . Hematuria 10/26/2020  . Urinary retention 10/26/2020  . Iron deficiency anemia rec'd IV iron 10/26/2020  . Anoxic brain injury ([place]) 10/26/2020  . HLD (hyperlipidemia) 10/26/2020  . CAD in native artery residual disease of LAD, may need PCI, treating medically for now 10/26/2020  . S/P angioplasty with stent 10/04/20 DES to LCX  10/26/2020  . Acute on chronic combined systolic and diastolic HF (heart failure) ([place]) 10/26/2020  . Pneumonia of both lungs due to infectious organism   . Acute respiratory failure ([place]), hypothermia therapy, vent - extubated 10/06/20   . Diabetes mellitus, type 2 ([place]) 10/06/2020  . Acute blood loss anemia 10/05/2020  . AKI (acute kidney injury) ([place]) 10/05/2020  . STEMI (ST elevation myocardial infarction) ([place]) 10/04/2020  . Encounter for central line placement   . Encounter for imaging study to confirm orogastric (OG) tube placement   . SOB (shortness of breath)   . Meningioma ([place]) 08/14/2020  . Vestibular schwannoma ([place]) 08/14/2020  . Cardiac arrest with ventricular fibrillation ([place])   . Pain of left hip joint 05/29/2020  . Benign brain tumor ([place])   . Chronic kidney disease   . History of pulmonary embolus (PE)   . Hypertension   . Nonsustained ventricular tachycardia ([place]) 10/11/2019  . Dizziness 10/11/2019  . Abnormal echocardiogram 06/07/2019   . Pulmonary hypertension due to thromboembolism ([place]) 03/09/2018  . Solitary pulmonary nodule on lung CT 03/08/2018  . Ascending aortic aneurysm (HCC) 4.2 cm based on CT from December 2020 01/29/2018  . Sinus bradycardia 12/22/2017  . Left bundle branch block 12/19/2017  . Personal history of pulmonary embolism 12/19/2017  . Chronic kidney disease, stage III (moderate) ([place]) 12/19/2017  . Benign neoplasm of brain ([place]) 12/19/2017  . Chest pain 12/04/2017    Hall Busing, PT, DPT 12/31/2020, 5:41 PM  [facility]. [place], [place], [address] Phone: [phone]   Fax:  [phone]  Name: Mohmmad Saleeby MRN: [id number] Date of Birth: [birth year]

## 2021-01-01 ENCOUNTER — Telehealth: Payer: Self-pay

## 2021-01-01 NOTE — Telephone Encounter (Signed)
-----   Message from Park Liter, MD sent at 12/31/2020 12:55 PM EDT ----- Kidney function still diminished but stable his creatinine is in the neighborhood of 2.07.  He does have evidence of congestive heart failure but overall was asymptomatic when I see him last time.  We will continue discussion about potentially fixing his left anterior descending artery in a month during office visit

## 2021-01-01 NOTE — Telephone Encounter (Signed)
Patient notified of results.

## 2021-01-05 ENCOUNTER — Ambulatory Visit: Payer: Medicare Other

## 2021-01-05 ENCOUNTER — Other Ambulatory Visit: Payer: Self-pay

## 2021-01-05 DIAGNOSIS — M25652 Stiffness of left hip, not elsewhere classified: Secondary | ICD-10-CM | POA: Diagnosis not present

## 2021-01-05 DIAGNOSIS — M6281 Muscle weakness (generalized): Secondary | ICD-10-CM | POA: Diagnosis not present

## 2021-01-05 DIAGNOSIS — M25552 Pain in left hip: Secondary | ICD-10-CM

## 2021-01-05 DIAGNOSIS — R2689 Other abnormalities of gait and mobility: Secondary | ICD-10-CM | POA: Diagnosis not present

## 2021-01-05 DIAGNOSIS — G931 Anoxic brain damage, not elsewhere classified: Secondary | ICD-10-CM

## 2021-01-05 DIAGNOSIS — R29898 Other symptoms and signs involving the musculoskeletal system: Secondary | ICD-10-CM | POA: Diagnosis not present

## 2021-01-05 NOTE — Therapy (Signed)
[facility]. [place], [place], [address] Phone: [phone]   Fax:  [phone]  Physical Therapy Treatment  Patient Details  Name: Timothy Moran MRN: [id number] Date of Birth: 1946-07-17 Referring Provider (PT): Reesa Chew PA-C   Encounter Date: 01/05/2021   PT End of Session - 01/05/21 1338    Visit Number 11    Number of Visits 17    Date for PT Re-Evaluation 01/13/21    Authorization Type Medicare    PT Start Time 1316    PT Stop Time 1358    PT Time Calculation (min) 42 min    Equipment Utilized During Treatment Gait belt    Activity Tolerance Patient tolerated treatment well    Behavior During Therapy [facility] for tasks assessed/performed           Past Medical History:  Diagnosis Date  . Abnormal echocardiogram 06/07/2019  . Acute on chronic combined systolic and diastolic HF (heart failure) ([place]) 10/26/2020  . Acute respiratory failure ([place]), hypothermia therapy, vent - extubated 10/06/20   . AKI (acute kidney injury) ([place]) 10/05/2020  . Anoxic brain injury ([place]) 10/26/2020  . Arrhythmia   . Ascending aortic aneurysm (HCC) 4.2 cm based on CT from December 2020 01/29/2018   4.3 cm by echo in 2019  . Benign brain tumor ([place])   . Benign neoplasm of brain ([place]) 12/19/2017  . CAD in native artery residual disease of LAD, may need PCI, treating medically for now 10/26/2020  . Cardiac arrest with ventricular fibrillation ([place])   . Chest pain 12/04/2017  . Chronic kidney disease    Stage 3  . Chronic kidney disease, stage III (moderate) ([place]) 12/19/2017  . Diabetes mellitus, type 2 ([place]) 10/06/2020   10/06/20 A1C 7%  . Dizziness 10/11/2019  . DM (diabetes mellitus), type 2 ([place]) new 10/26/2020  . Hematuria 10/26/2020  . History of pulmonary embolus (PE)   . HLD (hyperlipidemia) 10/26/2020  . Hypertension   . Iron deficiency anemia rec'd IV iron 10/26/2020  . Ischemic cardiomyopathy 10/26/2020  . Left bundle branch  block 12/19/2017  . Meningioma ([place]) 08/14/2020  . Nonsustained ventricular tachycardia ([place]) 10/11/2019  . Pain of left hip joint 05/29/2020  . Personal history of pulmonary embolism 12/19/2017  . Pneumonia of both lungs due to infectious organism   . Pulmonary hypertension due to thromboembolism ([place]) 03/09/2018   See echo 12/27/17 with nl PAS vs CTa chest 02/23/18   . S/P angioplasty with stent 10/04/20 DES to LCX  10/26/2020  . Sinus bradycardia 12/22/2017  . Solitary pulmonary nodule on lung CT 03/08/2018   CT 12/04/17 1.0 x 0.8 x 0.7 cm nodular opacity in the RUL vs not seen 03/16/10 posterior segment of the right upper lobe. [facility] KitchenSpirometry 03/08/2018    FEV1 3.86 (108%)  Ratio 96 s prior rx  - PET  03/13/18   Low grade c/w adenoca > rec T surgery eval    . STEMI (ST elevation myocardial infarction) ([place]) 10/04/2020  . Urinary retention 10/26/2020  . Vestibular schwannoma ([place]) 08/14/2020    Past Surgical History:  Procedure Laterality Date  . APPENDECTOMY    . CORONARY/GRAFT ACUTE MI REVASCULARIZATION N/A 10/04/2020   Procedure: Coronary/Graft Acute MI Revascularization;  Surgeon: Nelva Bush, MD;  Location: [facility];  Service: Cardiovascular;  Laterality: N/A;  . HERNIA REPAIR    . LEFT HEART CATH AND CORONARY ANGIOGRAPHY N/A 10/04/2020   Procedure: LEFT HEART CATH AND CORONARY  ANGIOGRAPHY;  Surgeon: Nelva Bush, MD;  Location: [facility];  Service: Cardiovascular;  Laterality: N/A;    There were no vitals filed for this visit.   Subjective Assessment - 01/05/21 1318    Subjective No big changes. Reports getting more active but still hasnt done stairs at home. Feels like he is doing okay.    Pertinent History HTN, arrhythmia, hx of pulmonary embolus, meningiomas, stage III CKD, R upper and lower lobe lung nodules, ascending aortic aneurysm, and L bundle branch block    Patient Stated Goals Getting back to normal - doing activities around the house, yard work. Improve endurance  strength and balance.    Currently in Pain? No/denies               [facility] Adult PT Treatment/Exercise - 01/05/21 0001      High Level Balance   High Level Balance Activities Marching forwards;Marching backwards;Turns    High Level Balance Comments modified SLS with one foot on 6" step - CS to CGA - more difficult with RLE . Short ball tosses with mod SLS     Lumbar Exercises: Machines for Strengthening   Leg Press 20# x 10, 40# x 10      Lumbar Exercises: Standing   Other Standing Lumbar Exercises Sit to stand: 5 x 3 from mat table (slightly elevated) holding yellow med ball. no incr dizziness today      Lumbar Exercises: Supine   Clam 20 reps   green TB   Bridge --   2 x 10 with green TB around thighs   Other Supine Lumbar Exercises supine  march w/ green TB 10 x 2      Knee/Hip Exercises: Standing   Lateral Step Up Both;2 sets;5 reps   lateral step up and overs 6" step in  bars.   Forward Step Up Both;1 set;10 reps   each leg- 2 sets total.   Walking with Sports Cord 40# x 3 each direction. CGA with occasional min A for LOB espeically with sidestepping R & L.                    PT Short Term Goals - 12/31/20 1325      PT SHORT TERM GOAL #1   Title Pt will be I and compliant with initial HEP.    Status Achieved             PT Long Term Goals - 12/31/20 1325      PT LONG TERM GOAL #1   Title Pt will return to walking for 45 minutes as daily exercise not limited by shortness of breath    Time 8    Period Weeks    Status On-going      PT LONG TERM GOAL #2   Title BLE strength increased to grossly 4+/5    Time 8    Status On-going      PT LONG TERM GOAL #3   Title Pt will be able to complete 6 MWT distance of at least 500 m with no greater than 4/10 RPE or Borg Dypsnea scale rating    Baseline 162 m with 3MWT initial eval. 12/31/20: 330 meters on 6MWT able to tolerate without standing rest, RPE 3-4/10    Time 8    Period Weeks    Status On-going       PT LONG TERM GOAL #4   Title Pt will score at least 7 points higher than  initial berg score to demo decreased falls risk    Baseline 30/56 initial. 12/31/20: 38/56.    Time 8    Period Weeks    Status On-going                 Plan - 01/05/21 1339    Clinical Impression Statement Timothy Moran did very nicely today with all exercise progressions - no dizzines reported today or lightheadedness. Was able to progress with sit to stands without LOB. Required CGA to intermittent min A for LOB with addition of resisted walking. We progressed step ups forward and lateral step up/overs with CGA/CS and standing rest breaks as needed between sets. Overall endurance seems to be improving.  He reports he has been doing more at home but still not full flight of stairs d/t balance concerns. We discussed continued balance difficulties and agreed to continue with PT for 1x/wk for another month  to work more on balance.    Personal Factors and Comorbidities Age;Time since onset of injury/illness/exacerbation;Comorbidity 3+;Fitness;Past/Current Experience    Rehab Potential Good    PT Frequency 1x / week    PT Duration 4 weeks    PT Treatment/Interventions ADLs/Self Care Home Management;Neuromuscular re-education;Balance training;Therapeutic exercise;Therapeutic activities;Functional mobility training;Patient/family education;Stair training;Manual techniques;Energy conservation;Taping;Vestibular    PT Next Visit Plan Follow up regarding pt symptoms and follow up with cardio/neuro. Modify PT POC as needed once pt follows up wth specialists.  Update HEP to more standing exercises/balance as tolerated next visit. Monitor VS as needed. Slowly progress endurance strength and balance as tolerated. Monitor of dyspnea/SOB on exertion and utilize RPE to grade activity    Consulted and Agree with Plan of Care Patient    Family Member Consulted --           Patient will benefit from skilled therapeutic intervention in  order to improve the following deficits and impairments:  Abnormal gait,Decreased strength,Improper body mechanics,Postural dysfunction,Difficulty walking,Cardiopulmonary status limiting activity,Decreased activity tolerance,Decreased balance,Pain,Decreased endurance  Visit Diagnosis: Muscle weakness (generalized)  Anoxic brain injury ([place])  Pain in left hip  Stiffness of left hip, not elsewhere classified  Other abnormalities of gait and mobility     Problem List Patient Active Problem List   Diagnosis Date Noted  . Arrhythmia   . DM (diabetes mellitus), type 2 ([place]) new 10/26/2020  . Hematuria 10/26/2020  . Urinary retention 10/26/2020  . Iron deficiency anemia rec'd IV iron 10/26/2020  . Anoxic brain injury ([place]) 10/26/2020  . HLD (hyperlipidemia) 10/26/2020  . CAD in native artery residual disease of LAD, may need PCI, treating medically for now 10/26/2020  . S/P angioplasty with stent 10/04/20 DES to LCX  10/26/2020  . Acute on chronic combined systolic and diastolic HF (heart failure) ([place]) 10/26/2020  . Pneumonia of both lungs due to infectious organism   . Acute respiratory failure ([place]), hypothermia therapy, vent - extubated 10/06/20   . Diabetes mellitus, type 2 ([place]) 10/06/2020  . Acute blood loss anemia 10/05/2020  . AKI (acute kidney injury) ([place]) 10/05/2020  . STEMI (ST elevation myocardial infarction) ([place]) 10/04/2020  . Encounter for central line placement   . Encounter for imaging study to confirm orogastric (OG) tube placement   . SOB (shortness of breath)   . Meningioma ([place]) 08/14/2020  . Vestibular schwannoma ([place]) 08/14/2020  . Cardiac arrest with ventricular fibrillation ([place])   . Pain of left hip joint 05/29/2020  . Benign brain tumor ([place])   . Chronic kidney disease   .  History of pulmonary embolus (PE)   . Hypertension   . Nonsustained ventricular tachycardia ([place]) 10/11/2019  . Dizziness 10/11/2019  . Abnormal echocardiogram 06/07/2019  .  Pulmonary hypertension due to thromboembolism ([place]) 03/09/2018  . Solitary pulmonary nodule on lung CT 03/08/2018  . Ascending aortic aneurysm (HCC) 4.2 cm based on CT from December 2020 01/29/2018  . Sinus bradycardia 12/22/2017  . Left bundle branch block 12/19/2017  . Personal history of pulmonary embolism 12/19/2017  . Chronic kidney disease, stage III (moderate) ([place]) 12/19/2017  . Benign neoplasm of brain ([place]) 12/19/2017  . Chest pain 12/04/2017    Hall Busing , PT, DPT 01/05/2021, 2:01 PM  [facility]. [place], [place], [address] Phone: [phone]   Fax:  [phone]  Name: Timothy Moran MRN: [id number] Date of Birth: 29-Aug-1946

## 2021-01-07 ENCOUNTER — Telehealth: Payer: Self-pay | Admitting: Cardiology

## 2021-01-07 NOTE — Telephone Encounter (Signed)
Patient calling for his lab results

## 2021-01-07 NOTE — Telephone Encounter (Signed)
Patient re-informed of lab results from 01/01/21

## 2021-01-12 ENCOUNTER — Other Ambulatory Visit: Payer: Self-pay

## 2021-01-12 ENCOUNTER — Ambulatory Visit: Payer: Medicare Other | Attending: Physical Medicine and Rehabilitation

## 2021-01-12 VITALS — BP 130/81

## 2021-01-12 DIAGNOSIS — G931 Anoxic brain damage, not elsewhere classified: Secondary | ICD-10-CM | POA: Diagnosis not present

## 2021-01-12 DIAGNOSIS — R2689 Other abnormalities of gait and mobility: Secondary | ICD-10-CM | POA: Diagnosis not present

## 2021-01-12 DIAGNOSIS — M25552 Pain in left hip: Secondary | ICD-10-CM | POA: Insufficient documentation

## 2021-01-12 DIAGNOSIS — R29898 Other symptoms and signs involving the musculoskeletal system: Secondary | ICD-10-CM | POA: Insufficient documentation

## 2021-01-12 DIAGNOSIS — R338 Other retention of urine: Secondary | ICD-10-CM | POA: Diagnosis not present

## 2021-01-12 DIAGNOSIS — M25652 Stiffness of left hip, not elsewhere classified: Secondary | ICD-10-CM | POA: Diagnosis not present

## 2021-01-12 DIAGNOSIS — M6281 Muscle weakness (generalized): Secondary | ICD-10-CM | POA: Diagnosis not present

## 2021-01-12 NOTE — Patient Instructions (Signed)
Access Code: [email or web address] URL: [email or web address] Date: 01/12/2021 Prepared by: Sherlynn Stalls  Exercises Sit to Stand with Counter Support - 1 x daily - 7 x weekly - 2 sets - 10 reps Standing March with Counter Support - 1 x daily - 7 x weekly - 2 sets - 10 reps Side Stepping with Counter Support - 1 x daily - 7 x weekly - 5 sets Standing Hip Extension with Counter Support - 1 x daily - 7 x weekly - 2 sets - 10 reps

## 2021-01-12 NOTE — Therapy (Signed)
[place] [place], [place], [address] Phone: [phone]   Fax:  [phone]  Physical Therapy Treatment/Recertification   Patient Details  Name: Timothy Moran MRN: [id number] Date of Birth: July 11, 1946 Referring Provider (PT): Reesa Chew PA-C   Encounter Date: 01/12/2021   PT End of Session - 01/12/21 1622    Visit Number 12    Date for PT Re-Evaluation [id number]   Recert [id number]   Authorization Type Medicare    PT Start Time 1615    PT Stop Time 1700    PT Time Calculation (min) 45 min    Equipment Utilized During Treatment Gait belt    Activity Tolerance Patient tolerated treatment well    Behavior During Therapy [facility] for tasks assessed/performed           Past Medical History:  Diagnosis Date  . Abnormal echocardiogram 06/07/2019  . Acute on chronic combined systolic and diastolic HF (heart failure) ([place]) 10/26/2020  . Acute respiratory failure ([place]), hypothermia therapy, vent - extubated 10/06/20   . AKI (acute kidney injury) ([place]) 10/05/2020  . Anoxic brain injury ([place]) 10/26/2020  . Arrhythmia   . Ascending aortic aneurysm (HCC) 4.2 cm based on CT from December 2020 01/29/2018   4.3 cm by echo in 2019  . Benign brain tumor ([place])   . Benign neoplasm of brain ([place]) 12/19/2017  . CAD in native artery residual disease of LAD, may need PCI, treating medically for now 10/26/2020  . Cardiac arrest with ventricular fibrillation ([place])   . Chest pain 12/04/2017  . Chronic kidney disease    Stage 3  . Chronic kidney disease, stage III (moderate) ([place]) 12/19/2017  . Diabetes mellitus, type 2 ([place]) 10/06/2020   10/06/20 A1C 7%  . Dizziness 10/11/2019  . DM (diabetes mellitus), type 2 ([place]) new 10/26/2020  . Hematuria 10/26/2020  . History of pulmonary embolus (PE)   . HLD (hyperlipidemia) 10/26/2020  . Hypertension   . Iron deficiency anemia rec'd IV iron 10/26/2020  . Ischemic cardiomyopathy 10/26/2020  . Left  bundle branch block 12/19/2017  . Meningioma ([place]) 08/14/2020  . Nonsustained ventricular tachycardia ([place]) 10/11/2019  . Pain of left hip joint 05/29/2020  . Personal history of pulmonary embolism 12/19/2017  . Pneumonia of both lungs due to infectious organism   . Pulmonary hypertension due to thromboembolism ([place]) 03/09/2018   See echo 12/27/17 with nl PAS vs CTa chest 02/23/18   . S/P angioplasty with stent 10/04/20 DES to LCX  10/26/2020  . Sinus bradycardia 12/22/2017  . Solitary pulmonary nodule on lung CT 03/08/2018   CT 12/04/17 1.0 x 0.8 x 0.7 cm nodular opacity in the RUL vs not seen 03/16/10 posterior segment of the right upper lobe. Marland KitchenSpirometry 03/08/2018    FEV1 3.86 (108%)  Ratio 96 s prior rx  - PET  03/13/18   Low grade c/w adenoca > rec T surgery eval    . STEMI (ST elevation myocardial infarction) ([place]) 10/04/2020  . Urinary retention 10/26/2020  . Vestibular schwannoma ([place]) 08/14/2020    Past Surgical History:  Procedure Laterality Date  . APPENDECTOMY    . CORONARY/GRAFT ACUTE MI REVASCULARIZATION N/A 10/04/2020   Procedure: Coronary/Graft Acute MI Revascularization;  Surgeon: Nelva Bush, MD;  Location: [facility];  Service: Cardiovascular;  Laterality: N/A;  . HERNIA REPAIR    . LEFT HEART CATH AND CORONARY ANGIOGRAPHY N/A 10/04/2020   Procedure: LEFT HEART CATH AND CORONARY ANGIOGRAPHY;  Surgeon:  End, Harrell Gave, MD;  Location: [facility];  Service: Cardiovascular;  Laterality: N/A;    Vitals:   01/12/21 1620  BP: 130/81     Subjective Assessment - 01/12/21 1618    Subjective Have been more lightheaded. Breathing is hard. Spoke to cardiologist about this and he said he thought eventually other stent can get put in. Have not called neurologist (per report previous supposed to have radiation around february which was cancelled d/t hospitalization)    Pertinent History HTN, arrhythmia, hx of pulmonary embolus, meningiomas, stage III CKD, R upper and lower lobe  lung nodules, ascending aortic aneurysm, and L bundle branch block    Patient Stated Goals Getting back to normal - doing activities around the house, yard work. Improve endurance strength and balance.    Currently in Pain? No/denies              [facility] PT Assessment - 01/12/21 0001      Assessment   Medical Diagnosis Anoxic brain injury 2/2 cardiac arrest    Referring Provider (PT) Reesa Chew PA-C    Onset Date/Surgical Date 10/04/20      6 Minute Walk- Baseline   6 Minute Walk- Baseline yes    BP (mmHg) 126/81    Modified Borg Scale for Dyspnea 0- Nothing at all      6 Minute walk- Post Test   6 Minute Walk Post Test yes    BP (mmHg) 142/81    HR (bpm) 67    02 Sat (%RA) 96 %      6 minute walk test results    Aerobic Endurance Distance Walked --   14 laps x 90 ft= 1260 ft                        [facility] Adult PT Treatment/Exercise - 01/12/21 0001      Ambulation/Gait   Stairs Yes    Stairs Assistance --   1 HR: 2, 6" steps x 4 reps.     Berg Balance Test   Sit to Stand Able to stand  independently using hands    Standing Unsupported Able to stand safely 2 minutes    Sitting with Back Unsupported but Feet Supported on Floor or Stool Able to sit safely and securely 2 minutes    Stand to Sit Sits safely with minimal use of hands    Transfers Able to transfer safely, minor use of hands    Standing Unsupported with Eyes Closed Able to stand 10 seconds with supervision    Standing Ubsupported with Feet Together Able to place feet together independently but unable to hold for 30 seconds    From Standing, Reach Forward with Outstretched Arm Can reach forward >12 cm safely (5")    From Standing Position, Pick up Object from Floor Able to pick up shoe, needs supervision    From Standing Position, Turn to Look Behind Over each Shoulder Looks behind one side only/other side shows less weight shift    Turn 360 Degrees Needs close supervision or verbal cueing   LOB  to the right   Standing Unsupported, Alternately Place Feet on Step/Stool Able to stand independently and safely and complete 8 steps in 20 seconds    Standing Unsupported, One Foot in Front Needs help to step but can hold 15 seconds   small LOB once stopping   Standing on One Leg Unable to try or needs assist to prevent fall  Total Score 39      High Level Balance   High Level Balance Activities Marching forwards;Marching backwards;Turns      Lumbar Exercises: Aerobic   Nustep L3 x 7 min                  PT Education - 01/12/21 1659    Education Details Updated HEP for standing strength and balance. [id number]. Educated on importance of following up with neuro (has not contacted neuro despite numerous reminders and discussions with PT)    Person(s) Educated Patient    Methods Explanation;Demonstration;Handout    Comprehension Verbalized understanding;Returned demonstration            PT Short Term Goals - 12/31/20 1325      PT SHORT TERM GOAL #1   Title Pt will be I and compliant with initial HEP.    Status Achieved             PT Long Term Goals - 01/12/21 1640      PT LONG TERM GOAL #1   Title Pt will return to walking for 45 minutes as daily exercise not limited by shortness of breath    Time 8    Period Weeks    Status On-going   has been doing short walks when he goes out with wife. about 10 min at a time     PT LONG TERM GOAL #2   Title BLE strength increased to grossly 4+/5    Time 8    Status On-going      PT LONG TERM GOAL #3   Title Pt will be able to complete 6 MWT distance of at least 500 m with no greater than 4/10 RPE or Borg Dypsnea scale rating    Baseline 162 m with 3MWT initial eval. 12/31/20: 330 meters on 6MWT able to tolerate without standing rest, RPE 3-4/10. 01/12/2021: 384 m without standing rest    Time 8    Period Weeks    Status On-going      PT LONG TERM GOAL #4   Title Pt will score at least 7 points higher than initial berg  score to demo decreased falls risk    Baseline 12/31/20: 38/56. 01/12/2021: 39/56    Time 8    Period Weeks    Status On-going                 Plan - 01/12/21 1627    Clinical Impression Statement Timothy Moran did very nicely today with all exercise progressions, although some lightheadedness reported initially with VSS. Was able to progress with sit to stands without LOB. Overall endurance seems to be improving as per scores on 6MWT. He reports he has been doing more at home but still not full flight of stairs d/t balance concerns. He continues to score lower on the berg 39/56 and demonstrates LOB especially with NBOS, turns. Balance is primary concern at this time.  We discussed continued balance difficulties and agreed to continue with PT for 1x/wk for another month to work more on balance. Also discussed improtance of follow up with neurologist which they have not established  yet (was supposed to see neuro in feb for radiation per pt report, never called to reschedule).    Personal Factors and Comorbidities Age;Time since onset of injury/illness/exacerbation;Comorbidity 3+;Fitness;Past/Current Experience    Rehab Potential Good    PT Frequency 1x / week    PT Duration 4 weeks    PT Treatment/Interventions ADLs/Self Care Home  Management;Neuromuscular re-education;Balance training;Therapeutic exercise;Therapeutic activities;Functional mobility training;Patient/family education;Stair training;Manual techniques;Energy conservation;Taping;Vestibular    PT Next Visit Plan Follow up regarding pt symptoms and follow up with cardio/neuro. Modify PT POC as needed once pt follows up wth specialists. Monitor VS as needed. Slowly progress endurance strength and balance as tolerated. Monitor of dyspnea/SOB on exertion and utilize RPE to grade activity    PT Home Exercise Plan Updated HEp provided today    Consulted and Agree with Plan of Care Patient           Patient will benefit from skilled therapeutic  intervention in order to improve the following deficits and impairments:  Abnormal gait,Decreased strength,Improper body mechanics,Postural dysfunction,Difficulty walking,Cardiopulmonary status limiting activity,Decreased activity tolerance,Decreased balance,Pain,Decreased endurance  Visit Diagnosis: Muscle weakness (generalized) - Plan: PT plan of care cert/re-cert  Anoxic brain injury ([place]) - Plan: PT plan of care cert/re-cert  Pain in left hip - Plan: PT plan of care cert/re-cert  Stiffness of left hip, not elsewhere classified - Plan: PT plan of care cert/re-cert  Other abnormalities of gait and mobility - Plan: PT plan of care cert/re-cert     Problem List Patient Active Problem List   Diagnosis Date Noted  . Arrhythmia   . DM (diabetes mellitus), type 2 ([place]) new 10/26/2020  . Hematuria 10/26/2020  . Urinary retention 10/26/2020  . Iron deficiency anemia rec'd IV iron 10/26/2020  . Anoxic brain injury ([place]) 10/26/2020  . HLD (hyperlipidemia) 10/26/2020  . CAD in native artery residual disease of LAD, may need PCI, treating medically for now 10/26/2020  . S/P angioplasty with stent 10/04/20 DES to LCX  10/26/2020  . Acute on chronic combined systolic and diastolic HF (heart failure) ([place]) 10/26/2020  . Pneumonia of both lungs due to infectious organism   . Acute respiratory failure ([place]), hypothermia therapy, vent - extubated 10/06/20   . Diabetes mellitus, type 2 ([place]) 10/06/2020  . Acute blood loss anemia 10/05/2020  . AKI (acute kidney injury) ([place]) 10/05/2020  . STEMI (ST elevation myocardial infarction) ([place]) 10/04/2020  . Encounter for central line placement   . Encounter for imaging study to confirm orogastric (OG) tube placement   . SOB (shortness of breath)   . Meningioma ([place]) 08/14/2020  . Vestibular schwannoma ([place]) 08/14/2020  . Cardiac arrest with ventricular fibrillation ([place])   . Pain of left hip joint 05/29/2020  . Benign brain tumor ([place])   . Chronic  kidney disease   . History of pulmonary embolus (PE)   . Hypertension   . Nonsustained ventricular tachycardia ([place]) 10/11/2019  . Dizziness 10/11/2019  . Abnormal echocardiogram 06/07/2019  . Pulmonary hypertension due to thromboembolism ([place]) 03/09/2018  . Solitary pulmonary nodule on lung CT 03/08/2018  . Ascending aortic aneurysm (HCC) 4.2 cm based on CT from December 2020 01/29/2018  . Sinus bradycardia 12/22/2017  . Left bundle branch block 12/19/2017  . Personal history of pulmonary embolism 12/19/2017  . Chronic kidney disease, stage III (moderate) ([place]) 12/19/2017  . Benign neoplasm of brain ([place]) 12/19/2017  . Chest pain 12/04/2017    Hall Busing, PT, DPT 01/12/2021, 5:58 PM  [address] [place], [place], [address] Phone: [phone]   Fax:  [phone]  Name: Timothy Moran MRN: [id number] Date of Birth: September 15, 1945

## 2021-01-13 ENCOUNTER — Telehealth: Payer: Self-pay | Admitting: Cardiology

## 2021-01-13 NOTE — Telephone Encounter (Signed)
STAT if patient feels like he/she is going to faint   1) Are you dizzy now? no  2) Do you feel faint or have you passed out? no  3) Do you have any other symptoms? no  4) Have you checked your HR and BP (record if available)? 109/67, 116/71, 103/56   Patient states he has been having dizziness for the last week or so. He states he has been going to physical therapy and get lightheaded there as well. He states it happens only when he is physically active.

## 2021-01-13 NOTE — Telephone Encounter (Signed)
Spoke to patient just now and he let me know that his dizziness is primarily when he is standing up. He has to stand for a while before he can walk or move or he feels like he is going to pass out. He also states that he can not stand on one foot or he will fall. He states that his blood pressure this morning was 103/56 sitting down.   Right now his blood pressures are as follows:  Sitting:  148/86  Standing: 114/75  Patient is still taking his metoprolol and hydralazine as prescribed.

## 2021-01-14 NOTE — Telephone Encounter (Signed)
Timothy Moran has already answered this question. The answer is below.    Spoke to patient just now and he let me know that his dizziness is primarily when he is standing up. He has to stand for a while before he can walk or move or he feels like he is going to pass out. He also states that he can not stand on one foot or he will fall. He states that his blood pressure this morning was 103/56 sitting down.    Right now his blood pressures are as follows:   Sitting:  148/86   Standing: 114/75   Patient is still taking his metoprolol and hydralazine as prescribed.

## 2021-01-15 MED ORDER — HYDRALAZINE HCL 10 MG PO TABS: 10.0000 mg | ORAL_TABLET | Freq: Three times a day (TID) | ORAL | 1 refills | Status: DC

## 2021-01-15 NOTE — Telephone Encounter (Signed)
Called patient informed him to decrease hydralazine to 10 mg 3 times daily. He understood no further questions.

## 2021-01-15 NOTE — Addendum Note (Signed)
Addended by: Senaida Ores on: 01/15/2021 04:19 PM   Modules accepted: Orders

## 2021-01-20 ENCOUNTER — Ambulatory Visit: Payer: Medicare Other

## 2021-01-20 ENCOUNTER — Other Ambulatory Visit: Payer: Self-pay

## 2021-01-20 VITALS — BP 109/65

## 2021-01-20 DIAGNOSIS — M25652 Stiffness of left hip, not elsewhere classified: Secondary | ICD-10-CM

## 2021-01-20 DIAGNOSIS — G931 Anoxic brain damage, not elsewhere classified: Secondary | ICD-10-CM

## 2021-01-20 DIAGNOSIS — R29898 Other symptoms and signs involving the musculoskeletal system: Secondary | ICD-10-CM | POA: Diagnosis not present

## 2021-01-20 DIAGNOSIS — M25552 Pain in left hip: Secondary | ICD-10-CM

## 2021-01-20 DIAGNOSIS — M6281 Muscle weakness (generalized): Secondary | ICD-10-CM

## 2021-01-20 DIAGNOSIS — R2689 Other abnormalities of gait and mobility: Secondary | ICD-10-CM | POA: Diagnosis not present

## 2021-01-20 NOTE — Therapy (Signed)
[place]. [place], [place], [address] Phone: [phone]   Fax:  [phone]  Physical Therapy Treatment  Patient Details  Name: Timothy Moran MRN: [id number] Date of Birth: 18-Nov-1945 Referring Provider (PT): Reesa Chew PA-C   Encounter Date: 01/20/2021   PT End of Session - 01/20/21 1321    Visit Number 13    Date for PT Re-Evaluation 17/51/02   Recert 01/18/5276   Authorization Type Medicare    PT Start Time 1316    PT Stop Time 1400    PT Time Calculation (min) 44 min    Equipment Utilized During Treatment Gait belt    Activity Tolerance Patient tolerated treatment well    Behavior During Therapy [facility] for tasks assessed/performed           Past Medical History:  Diagnosis Date  . Abnormal echocardiogram 06/07/2019  . Acute on chronic combined systolic and diastolic HF (heart failure) ([place]) 10/26/2020  . Acute respiratory failure ([place]), hypothermia therapy, vent - extubated 10/06/20   . AKI (acute kidney injury) ([place]) 10/05/2020  . Anoxic brain injury ([place]) 10/26/2020  . Arrhythmia   . Ascending aortic aneurysm (HCC) 4.2 cm based on CT from December 2020 01/29/2018   4.3 cm by echo in 2019  . Benign brain tumor ([place])   . Benign neoplasm of brain ([place]) 12/19/2017  . CAD in native artery residual disease of LAD, may need PCI, treating medically for now 10/26/2020  . Cardiac arrest with ventricular fibrillation ([place])   . Chest pain 12/04/2017  . Chronic kidney disease    Stage 3  . Chronic kidney disease, stage III (moderate) ([place]) 12/19/2017  . Diabetes mellitus, type 2 ([place]) 10/06/2020   10/06/20 A1C 7%  . Dizziness 10/11/2019  . DM (diabetes mellitus), type 2 ([place]) new 10/26/2020  . Hematuria 10/26/2020  . History of pulmonary embolus (PE)   . HLD (hyperlipidemia) 10/26/2020  . Hypertension   . Iron deficiency anemia rec'd IV iron 10/26/2020  . Ischemic cardiomyopathy 10/26/2020  . Left bundle branch block  12/19/2017  . Meningioma ([place]) 08/14/2020  . Nonsustained ventricular tachycardia ([place]) 10/11/2019  . Pain of left hip joint 05/29/2020  . Personal history of pulmonary embolism 12/19/2017  . Pneumonia of both lungs due to infectious organism   . Pulmonary hypertension due to thromboembolism ([place]) 03/09/2018   See echo 12/27/17 with nl PAS vs CTa chest 02/23/18   . S/P angioplasty with stent 10/04/20 DES to LCX  10/26/2020  . Sinus bradycardia 12/22/2017  . Solitary pulmonary nodule on lung CT 03/08/2018   CT 12/04/17 1.0 x 0.8 x 0.7 cm nodular opacity in the RUL vs not seen 03/16/10 posterior segment of the right upper lobe. Marland KitchenSpirometry 03/08/2018    FEV1 3.86 (108%)  Ratio 96 s prior rx  - PET  03/13/18   Low grade c/w adenoca > rec T surgery eval    . STEMI (ST elevation myocardial infarction) ([place]) 10/04/2020  . Urinary retention 10/26/2020  . Vestibular schwannoma ([place]) 08/14/2020    Past Surgical History:  Procedure Laterality Date  . APPENDECTOMY    . CORONARY/GRAFT ACUTE MI REVASCULARIZATION N/A 10/04/2020   Procedure: Coronary/Graft Acute MI Revascularization;  Surgeon: Nelva Bush, MD;  Location: [facility];  Service: Cardiovascular;  Laterality: N/A;  . HERNIA REPAIR    . LEFT HEART CATH AND CORONARY ANGIOGRAPHY N/A 10/04/2020   Procedure: LEFT HEART CATH AND CORONARY ANGIOGRAPHY;  Surgeon: End,  Harrell Gave, MD;  Location: [facility];  Service: Cardiovascular;  Laterality: N/A;    Vitals:   01/20/21 1319 01/20/21 1357  BP: 122/72 109/65     Subjective Assessment - 01/20/21 1318    Subjective Called cardiologist regaridn change in BP - meds changed. still getting dizzy or lightheaded, no falls    Pertinent History HTN, arrhythmia, hx of pulmonary embolus, meningiomas, stage III CKD, R upper and lower lobe lung nodules, ascending aortic aneurysm, and L bundle branch block    Patient Stated Goals Getting back to normal - doing activities around the house, yard work. Improve  endurance strength and balance.    Currently in Pain? No/denies                             [facility] Adult PT Treatment/Exercise - 01/20/21 0001      High Level Balance   High Level Balance Activities Tandem walking;Backward walking   tandem w/ 1 hand on, backward with CGA   High Level Balance Comments 180 deg turns x5 back and forth each side in  bars . Increased instabiltiy and LOB with turns toward the left.  Seated head turns x 10B. Clock arms feet hip width vs feet together - more diifuclty with balace with left arm mvmts.      Lumbar Exercises: Standing   Other Standing Lumbar Exercises staggered stance with yellow med ball x 5 right lead (some incr knee pain), x 10 regular stance without pain.      Lumbar Exercises: Supine   Clam --   2 x 15 with green TB   Bridge Compliant   2 x15   Other Supine Lumbar Exercises supine  march w/ green TB 15 x 2      Knee/Hip Exercises: Standing   Other Standing Knee Exercises hip abd and march x 10 alt B      Knee/Hip Exercises: Seated   Long Arc Quad Strengthening;Both;2 sets;15 reps   red TB   Other Seated Knee/Hip Exercises heel/toe raises x 20           Vestibular Treatment/Exercise - 01/20/21 0001      Vestibular Treatment/Exercise   Vestibular Treatment Provided Gaze;Habituation      Seated Horizontal Head Turns   Number of Reps  10      Eye/Head Exercise Vertical   Comment smooth prusuits horizontal/vert x 10 each at incr speed 1 sec each direction. educated in adding these                   PT Short Term Goals - 12/31/20 1325      PT SHORT TERM GOAL #1   Title Pt will be I and compliant with initial HEP.    Status Achieved             PT Long Term Goals - 01/12/21 1640      PT LONG TERM GOAL #1   Title Pt will return to walking for 45 minutes as daily exercise not limited by shortness of breath    Time 8    Period Weeks    Status On-going   has been doing short walks when he goes out  with wife. about 10 min at a time     PT LONG TERM GOAL #2   Title BLE strength increased to grossly 4+/5    Time 8    Status On-going  PT LONG TERM GOAL #3   Title Pt will be able to complete 6 MWT distance of at least 500 m with no greater than 4/10 RPE or Borg Dypsnea scale rating    Baseline 162 m with 3MWT initial eval. 12/31/20: 330 meters on 6MWT able to tolerate without standing rest, RPE 3-4/10. 01/12/2021: 384 m without standing rest    Time 8    Period Weeks    Status On-going      PT LONG TERM GOAL #4   Title Pt will score at least 7 points higher than initial berg score to demo decreased falls risk    Baseline 12/31/20: 38/56. 01/12/2021: 39/56    Time 8    Period Weeks    Status On-going                 Plan - 01/20/21 1358    Clinical Impression Statement Rein reports change in BP meds. Bp assessed start of session and at end of session, a bit lower at the end but not reporting any symptoms. He tolerated exercises well today with increased instability with head turns or body turns toward the left today. We added smooth pursuits to HEP to work on vestib function    Personal Factors and Comorbidities Age;Time since onset of injury/illness/exacerbation;Comorbidity 3+;Fitness;Past/Current Experience    Rehab Potential Good    PT Frequency 1x / week    PT Duration 4 weeks    PT Treatment/Interventions ADLs/Self Care Home Management;Neuromuscular re-education;Balance training;Therapeutic exercise;Therapeutic activities;Functional mobility training;Patient/family education;Stair training;Manual techniques;Energy conservation;Taping;Vestibular    PT Next Visit Plan Follow up regarding pt symptoms and follow up with cardio/neuro. Modify PT POC as needed once pt follows up wth specialists. Monitor VS as needed. Slowly progress endurance strength and balance as tolerated. Monitor of dyspnea/SOB on exertion and utilize RPE to grade activity    Consulted and Agree with Plan of  Care Patient           Patient will benefit from skilled therapeutic intervention in order to improve the following deficits and impairments:  Abnormal gait,Decreased strength,Improper body mechanics,Postural dysfunction,Difficulty walking,Cardiopulmonary status limiting activity,Decreased activity tolerance,Decreased balance,Pain,Decreased endurance  Visit Diagnosis: Muscle weakness (generalized)  Anoxic brain injury ([place])  Pain in left hip  Stiffness of left hip, not elsewhere classified  Other abnormalities of gait and mobility  Other symptoms and signs involving the musculoskeletal system     Problem List Patient Active Problem List   Diagnosis Date Noted  . Arrhythmia   . DM (diabetes mellitus), type 2 ([place]) new 10/26/2020  . Hematuria 10/26/2020  . Urinary retention 10/26/2020  . Iron deficiency anemia rec'd IV iron 10/26/2020  . Anoxic brain injury ([place]) 10/26/2020  . HLD (hyperlipidemia) 10/26/2020  . CAD in native artery residual disease of LAD, may need PCI, treating medically for now 10/26/2020  . S/P angioplasty with stent 10/04/20 DES to LCX  10/26/2020  . Acute on chronic combined systolic and diastolic HF (heart failure) ([place]) 10/26/2020  . Pneumonia of both lungs due to infectious organism   . Acute respiratory failure ([place]), hypothermia therapy, vent - extubated 10/06/20   . Diabetes mellitus, type 2 ([place]) 10/06/2020  . Acute blood loss anemia 10/05/2020  . AKI (acute kidney injury) ([place]) 10/05/2020  . STEMI (ST elevation myocardial infarction) ([place]) 10/04/2020  . Encounter for central line placement   . Encounter for imaging study to confirm orogastric (OG) tube placement   . SOB (shortness of breath)   . Meningioma (  [place]) 08/14/2020  . Vestibular schwannoma ([place]) 08/14/2020  . Cardiac arrest with ventricular fibrillation ([place])   . Pain of left hip joint 05/29/2020  . Benign brain tumor ([place])   . Chronic kidney disease   . History of pulmonary  embolus (PE)   . Hypertension   . Nonsustained ventricular tachycardia ([place]) 10/11/2019  . Dizziness 10/11/2019  . Abnormal echocardiogram 06/07/2019  . Pulmonary hypertension due to thromboembolism ([place]) 03/09/2018  . Solitary pulmonary nodule on lung CT 03/08/2018  . Ascending aortic aneurysm (HCC) 4.2 cm based on CT from December 2020 01/29/2018  . Sinus bradycardia 12/22/2017  . Left bundle branch block 12/19/2017  . Personal history of pulmonary embolism 12/19/2017  . Chronic kidney disease, stage III (moderate) ([place]) 12/19/2017  . Benign neoplasm of brain ([employer]) 12/19/2017  . Chest pain 12/04/2017    Hall Busing, PT, DPT 01/20/2021, 1:59 PM  [place]. [place], [place], [address] Phone: [phone]   Fax:  [phone]  Name: Timothy Moran MRN: [id number] Date of Birth: 02-04-1946

## 2021-01-21 ENCOUNTER — Other Ambulatory Visit: Payer: Self-pay

## 2021-01-26 ENCOUNTER — Other Ambulatory Visit: Payer: Self-pay

## 2021-01-26 ENCOUNTER — Encounter: Payer: Self-pay | Admitting: Cardiology

## 2021-01-26 ENCOUNTER — Ambulatory Visit (INDEPENDENT_AMBULATORY_CARE_PROVIDER_SITE_OTHER): Payer: Medicare Other | Admitting: Cardiology

## 2021-01-26 VITALS — BP 138/82 | HR 41 | Ht 73.0 in | Wt 224.0 lb

## 2021-01-26 DIAGNOSIS — I2729 Other secondary pulmonary hypertension: Secondary | ICD-10-CM

## 2021-01-26 DIAGNOSIS — I472 Ventricular tachycardia: Secondary | ICD-10-CM | POA: Diagnosis not present

## 2021-01-26 DIAGNOSIS — I4729 Other ventricular tachycardia: Secondary | ICD-10-CM

## 2021-01-26 DIAGNOSIS — I469 Cardiac arrest, cause unspecified: Secondary | ICD-10-CM | POA: Diagnosis not present

## 2021-01-26 DIAGNOSIS — I2510 Atherosclerotic heart disease of native coronary artery without angina pectoris: Secondary | ICD-10-CM | POA: Diagnosis not present

## 2021-01-26 DIAGNOSIS — I447 Left bundle-branch block, unspecified: Secondary | ICD-10-CM | POA: Diagnosis not present

## 2021-01-26 DIAGNOSIS — R001 Bradycardia, unspecified: Secondary | ICD-10-CM | POA: Diagnosis not present

## 2021-01-26 DIAGNOSIS — I255 Ischemic cardiomyopathy: Secondary | ICD-10-CM | POA: Diagnosis not present

## 2021-01-26 DIAGNOSIS — I4901 Ventricular fibrillation: Secondary | ICD-10-CM | POA: Diagnosis not present

## 2021-01-26 DIAGNOSIS — I2699 Other pulmonary embolism without acute cor pulmonale: Secondary | ICD-10-CM

## 2021-01-26 NOTE — Progress Notes (Signed)
Cardiology Office Note:    Date:  01/26/2021   ID:  [name], DOB [birth year], MRN [id number]  PCP:  Cari Caraway, MD  Cardiologist:  Jenne Campus, MD    Referring MD: Cari Caraway, MD   Chief Complaint  Patient presents with  . Shortness of Breath  . Dizziness    History of Present Illness:    Korben Carcione is a 75 y.o. male with incredibly complex past medical history.  Initially he was seen in my office because of history of pulmonary emboli he did have mild pulmonary hypertension but overall was doing quite well.  However at the end of January he was shoveling the snow started having chest pain 1 back and collapse he was found to be in V. fib he was defibrillated brought to [facility] he was found to have acute STEMI involving circumflex artery that was addressed with PTCA and drug-eluting stent.  He does have residual 80% stenosis of the mid LAD he ended up having diminished ejection fraction with level of 25%.  He did wear LifeVest however absolutely hated this and does not want to wear it anymore.  He comes today to my office complaining of having some fatigue tiredness as well as dizziness" upon getting up.  Interesting, he also got some history of meningioma and radiation has been planned for his head.  However the being postponed because of coronary disease.  Past Medical History:  Diagnosis Date  . Abnormal echocardiogram 06/07/2019  . Acute blood loss anemia 10/05/2020  . Acute on chronic combined systolic and diastolic HF (heart failure) ([place]) 10/26/2020  . Acute respiratory failure ([place]), hypothermia therapy, vent - extubated 10/06/20   . AKI (acute kidney injury) ([place]) 10/05/2020  . Anoxic brain injury ([place]) 10/26/2020  . Arrhythmia   . Ascending aortic aneurysm (HCC) 4.2 cm based on CT from December 2020 01/29/2018   4.3 cm by echo in 2019  . Benign brain tumor ([place])   . Benign neoplasm of brain ([place]) 12/19/2017  . CAD in native artery residual disease of  LAD, may need PCI, treating medically for now 10/26/2020  . Cardiac arrest with ventricular fibrillation ([place])   . Chest pain 12/04/2017  . Chronic kidney disease    Stage 3  . Chronic kidney disease, stage III (moderate) ([place]) 12/19/2017  . Diabetes mellitus, type 2 ([place]) 10/06/2020   10/06/20 A1C 7%  . Dizziness 10/11/2019  . DM (diabetes mellitus), type 2 ([place]) new 10/26/2020  . Encounter for central line placement   . Encounter for imaging study to confirm orogastric (OG) tube placement   . Hematuria 10/26/2020  . History of pulmonary embolus (PE)   . HLD (hyperlipidemia) 10/26/2020  . Hypertension   . Iron deficiency anemia rec'd IV iron 10/26/2020  . Ischemic cardiomyopathy 10/26/2020  . Left bundle branch block 12/19/2017  . Meningioma ([place]) 08/14/2020  . Nonsustained ventricular tachycardia ([place]) 10/11/2019  . Pain of left hip joint 05/29/2020  . Personal history of pulmonary embolism 12/19/2017  . Pneumonia of both lungs due to infectious organism   . Pulmonary hypertension due to thromboembolism ([place]) 03/09/2018   See echo 12/27/17 with nl PAS vs CTa chest 02/23/18   . S/P angioplasty with stent 10/04/20 DES to LCX  10/26/2020  . Sinus bradycardia 12/22/2017  . SOB (shortness of breath)   . Solitary pulmonary nodule on lung CT 03/08/2018   CT 12/04/17 1.0 x 0.8 x 0.7 cm nodular opacity in the RUL vs not seen  03/16/10 posterior segment of the right upper lobe. Marland KitchenSpirometry 03/08/2018    FEV1 3.86 (108%)  Ratio 96 s prior rx  - PET  03/13/18   Low grade c/w adenoca > rec T surgery eval    . STEMI (ST elevation myocardial infarction) ([place]) 10/04/2020  . Urinary retention 10/26/2020  . Vestibular schwannoma ([place]) 08/14/2020    Past Surgical History:  Procedure Laterality Date  . APPENDECTOMY    . CORONARY/GRAFT ACUTE MI REVASCULARIZATION N/A 10/04/2020   Procedure: Coronary/Graft Acute MI Revascularization;  Surgeon: Nelva Bush, MD;  Location: [facility];  Service: Cardiovascular;   Laterality: N/A;  . HERNIA REPAIR    . LEFT HEART CATH AND CORONARY ANGIOGRAPHY N/A 10/04/2020   Procedure: LEFT HEART CATH AND CORONARY ANGIOGRAPHY;  Surgeon: Nelva Bush, MD;  Location: [facility];  Service: Cardiovascular;  Laterality: N/A;    Current Medications: Current Meds  Medication Sig  . acetaminophen (TYLENOL) 325 MG tablet Take 1-2 tablets (325-650 mg total) by mouth every 4 (four) hours as needed for mild pain.  Marland Kitchen aspirin 81 MG chewable tablet Chew 1 tablet (81 mg total) by mouth daily.  Marland Kitchen atorvastatin (LIPITOR) 80 MG tablet Take 1 tablet (80 mg total) by mouth daily.  . B Complex-C (SUPER B COMPLEX PO) Take 1 tablet by mouth daily. Unknown strength  . docusate sodium (COLACE) 100 MG capsule Take 2 capsules (200 mg total) by mouth daily.  . finasteride (PROSCAR) 5 MG tablet Take 5 mg by mouth daily.  . hydrALAZINE (APRESOLINE) 10 MG tablet Take 1 tablet (10 mg total) by mouth 3 (three) times daily.  . isosorbide dinitrate (ISORDIL) 30 MG tablet Take 1 tablet (30 mg total) by mouth 2 (two) times daily.  . melatonin 5 MG TABS Take 1 tablet (5 mg total) by mouth at bedtime.  . metoprolol succinate (TOPROL-XL) 50 MG 24 hr tablet Take 1 tablet (50 mg total) by mouth daily. Take with or immediately following a meal.  . Multiple Vitamin (MULTIVITAMIN WITH MINERALS) TABS tablet Take 1 tablet by mouth daily. (Patient taking differently: Take 1 tablet by mouth daily. Unknown strength)  . nitroGLYCERIN (NITROSTAT) 0.4 MG SL tablet Place 1 tablet (0.4 mg total) under the tongue every 5 (five) minutes as needed for chest pain.  Marland Kitchen senna-docusate (SENOKOT-S) 8.6-50 MG tablet Take 1 tablet by mouth at bedtime.  . sodium bicarbonate 650 MG tablet Take 2 tablets (1,300 mg total) by mouth 3 (three) times daily.  . tamsulosin (FLOMAX) 0.4 MG CAPS capsule Take 1 capsule (0.4 mg total) by mouth daily.  . ticagrelor (BRILINTA) 90 MG TABS tablet Take 1 tablet (90 mg total) by mouth 2 (two)  times daily.  . traZODone (DESYREL) 50 MG tablet Take 1 tablet (50 mg total) by mouth at bedtime.     Allergies:   Patient has no known allergies.   Social History   Socioeconomic History  . Marital status: Married    Spouse name: Amar Sippel   . Number of children: 1  . Years of education: Not on file  . Highest education level: Not on file  Occupational History  . Not on file  Tobacco Use  . Smoking status: Former Smoker    Packs/day: 0.50    Years: 5.00    Pack years: 2.50    Types: Cigarettes    Quit date: 09/12/1968    Years since quitting: 52.4  . Smokeless tobacco: Never Used  Vaping Use  .  Vaping Use: Never used  Substance and Sexual Activity  . Alcohol use: Yes    Comment: ONE PER WEEK  . Drug use: Never  . Sexual activity: Not on file  Other Topics Concern  . Not on file  Social History Narrative  . Not on file   Social Determinants of Health   Financial Resource Strain: Not on file  Food Insecurity: Not on file  Transportation Needs: Not on file  Physical Activity: Not on file  Stress: Not on file  Social Connections: Not on file     Family History: The patient's family history includes Brain cancer in his sister; Diabetes in his mother; Leukemia in his brother; Melanoma in his brother. ROS:   Please see the history of present illness.    All 14 point review of systems negative except as described per history of present illness  EKGs/Labs/Other Studies Reviewed:      Recent Labs: 10/06/2020: TSH 3.917 10/08/2020: B Natriuretic Peptide 1,434.0 10/12/2020: Magnesium 2.2 10/30/2020: ALT 50 11/02/2020: Hemoglobin 9.0; Platelets 284 12/28/2020: BUN 32; Creatinine, Ser 2.07; NT-Pro BNP 2,733; Potassium 4.7; Sodium 141  Recent Lipid Panel    Component Value Date/Time   CHOL 114 10/06/2020 0317   CHOL 247 (H) 06/01/2020 0858   TRIG 163 (H) 10/06/2020 0317   HDL 39 (L) 10/06/2020 0317   HDL 33 (L) 06/01/2020 0858   CHOLHDL 2.9 10/06/2020 0317    VLDL 33 10/06/2020 0317   LDLCALC 42 10/06/2020 0317   LDLCALC 151 (H) 06/01/2020 0858    Physical Exam:    VS:  BP 138/82 (BP Location: Right Arm, Patient Position: Sitting)   Pulse (!) 41   Ht 6\' 1"  (1.854 m)   Wt 224 lb (101.6 kg)   SpO2 94%   BMI 29.55 kg/m     Wt Readings from Last 3 Encounters:  01/26/21 224 lb (101.6 kg)  12/28/20 216 lb (98 kg)  12/15/20 221 lb 12.8 oz (100.6 kg)     GEN:  Well nourished, well developed in no acute distress HEENT: Normal NECK: No JVD; No carotid bruits LYMPHATICS: No lymphadenopathy CARDIAC: RRR, no murmurs, no rubs, no gallops RESPIRATORY:  Clear to auscultation without rales, wheezing or rhonchi  ABDOMEN: Soft, non-tender, non-distended MUSCULOSKELETAL:  No edema; No deformity  SKIN: Warm and dry LOWER EXTREMITIES: no swelling NEUROLOGIC:  Alert and oriented x 3 PSYCHIATRIC:  Normal affect   ASSESSMENT:    1. Sinus bradycardia   2. CAD in native artery residual disease of LAD, may need PCI, treating medically for now   3. Cardiac arrest with ventricular fibrillation ([place])   4. Ischemic cardiomyopathy   5. Left bundle branch block   6. Nonsustained ventricular tachycardia (HCC)   7. Pulmonary hypertension due to thromboembolism ([place])    PLAN:    In order of problems listed above:  1. Sinus bradycardia, EKG done today showed left bundle branch was sinus bradycardia, frequent ventricular ectopy we will continue small dose of beta-blocker. 2. Coronary disease with residual 80% mid LAD lesion of her situation is horribly difficult.  He does have kidney dysfunction with creatinine of 2 during cardiac catheterization fix it made LAD lesion can create a problem.  The plan is to repeat his echocardiogram to look at his left ventricle ejection fraction if ejection fraction still diminished we may pursue cardiac catheterization with mid LAD lesion fixing.  I will refer him for evaluation by interventional cardiologist for that.  In the  meantime  likely he does not have any chest pain tightness squeezing pressure burning chest. 3. Ischemic cardiomyopathy with difficulty tolerating medication I cannot put him on Entresto or ACE inhibitor or ARB secondary to kidney dysfunction.  He is already on isosorbide hydralazine metoprolol.  But have difficulty tolerating it.  He does have some dizziness upon getting up still was quite more complicated he also got some meningioma involving close location to his right ear which may contribute to his dizziness. 4. History of nonsustained ventricular tachycardia only on beta-blocker I am afraid to put him on amiodarone on top of beta-blocker because of resting bradycardia already.  However if he still has significantly diminished left ventricle ejection fraction we may be forced to do that.  He eventually required ICD.  The reason why it was not done is the fact that he does have indwelling catheter.  At the same time since his recent coronary event as well as dual antiplatelet therapy doing turp is probably not the best idea.  Overall it is a very difficult situation.   Medication Adjustments/Labs and Tests Ordered: Current medicines are reviewed at length with the patient today.  Concerns regarding medicines are outlined above.  No orders of the defined types were placed in this encounter.  Medication changes: No orders of the defined types were placed in this encounter.   Signed, Park Liter, MD, [facility] 01/26/2021 4:41 PM    [facility]

## 2021-01-26 NOTE — Patient Instructions (Signed)
Medication Instructions:  Your physician recommends that you continue on your current medications as directed. Please refer to the Current Medication list given to you today.  *If you need a refill on your cardiac medications before your next appointment, please call your pharmacy*   Lab Work: None If you have labs (blood work) drawn today and your tests are completely normal, you will receive your results only by: Marland Kitchen MyChart Message (if you have MyChart) OR . A paper copy in the mail If you have any lab test that is abnormal or we need to change your treatment, we will call you to review the results.   Testing/Procedures: Your physician has requested that you have an echocardiogram. Echocardiography is a painless test that uses sound waves to create images of your heart. It provides your doctor with information about the size and shape of your heart and how well your heart's chambers and valves are working. This procedure takes approximately one hour. There are no restrictions for this procedure.     Follow-Up: At [facility], you and your health needs are our priority.  As part of our continuing mission to provide you with exceptional heart care, we have created designated Provider Care Teams.  These Care Teams include your primary Cardiologist (physician) and Advanced Practice Providers (APPs -  Physician Assistants and Nurse Practitioners) who all work together to provide you with the care you need, when you need it.  We recommend signing up for the patient portal called "MyChart".  Sign up information is provided on this After Visit Summary.  MyChart is used to connect with patients for Virtual Visits (Telemedicine).  Patients are able to view lab/test results, encounter notes, upcoming appointments, etc.  Non-urgent messages can be sent to your provider as well.   To learn more about what you can do with MyChart, go to [email or web address].    Your next appointment:   6  week(s)  The format for your next appointment:   In Person  Provider:   Jenne Campus, MD   Other Instructions   Echocardiogram An echocardiogram is a test that uses sound waves (ultrasound) to produce images of the heart. Images from an echocardiogram can provide important information about:  Heart size and shape.  The size and thickness and movement of your heart's walls.  Heart muscle function and strength.  Heart valve function or if you have stenosis. Stenosis is when the heart valves are too narrow.  If blood is flowing backward through the heart valves (regurgitation).  A tumor or infectious growth around the heart valves.  Areas of heart muscle that are not working well because of poor blood flow or injury from a heart attack.  Aneurysm detection. An aneurysm is a weak or damaged part of an artery wall. The wall bulges out from the normal force of blood pumping through the body. Tell a health care provider about:  Any allergies you have.  All medicines you are taking, including vitamins, herbs, eye drops, creams, and over-the-counter medicines.  Any blood disorders you have.  Any surgeries you have had.  Any medical conditions you have.  Whether you are pregnant or may be pregnant. What are the risks? Generally, this is a safe test. However, problems may occur, including an allergic reaction to dye (contrast) that may be used during the test. What happens before the test? No specific preparation is needed. You may eat and drink normally. What happens during the test?  You will take off your  clothes from the waist up and put on a hospital gown.  Electrodes or electrocardiogram (ECG)patches may be placed on your chest. The electrodes or patches are then connected to a device that monitors your heart rate and rhythm.  You will lie down on a table for an ultrasound exam. A gel will be applied to your chest to help sound waves pass through your skin.  A  handheld device, called a transducer, will be pressed against your chest and moved over your heart. The transducer produces sound waves that travel to your heart and bounce back (or "echo" back) to the transducer. These sound waves will be captured in real-time and changed into images of your heart that can be viewed on a video monitor. The images will be recorded on a computer and reviewed by your health care provider.  You may be asked to change positions or hold your breath for a short time. This makes it easier to get different views or better views of your heart.  In some cases, you may receive contrast through an IV in one of your veins. This can improve the quality of the pictures from your heart. The procedure may vary among health care providers and hospitals.   What can I expect after the test? You may return to your normal, everyday life, including diet, activities, and medicines, unless your health care provider tells you not to do that. Follow these instructions at home:  It is up to you to get the results of your test. Ask your health care provider, or the department that is doing the test, when your results will be ready.  Keep all follow-up visits. This is important. Summary  An echocardiogram is a test that uses sound waves (ultrasound) to produce images of the heart.  Images from an echocardiogram can provide important information about the size and shape of your heart, heart muscle function, heart valve function, and other possible heart problems.  You do not need to do anything to prepare before this test. You may eat and drink normally.  After the echocardiogram is completed, you may return to your normal, everyday life, unless your health care provider tells you not to do that. This information is not intended to replace advice given to you by your health care provider. Make sure you discuss any questions you have with your health care provider. Document Revised:  04/21/2020 Document Reviewed: 04/21/2020 Elsevier Patient Education  2021 Elsevier Inc.   

## 2021-01-27 ENCOUNTER — Telehealth: Payer: Self-pay | Admitting: Cardiology

## 2021-01-27 ENCOUNTER — Ambulatory Visit: Payer: Medicare Other

## 2021-01-27 VITALS — BP 119/75

## 2021-01-27 DIAGNOSIS — R29898 Other symptoms and signs involving the musculoskeletal system: Secondary | ICD-10-CM | POA: Diagnosis not present

## 2021-01-27 DIAGNOSIS — M25652 Stiffness of left hip, not elsewhere classified: Secondary | ICD-10-CM

## 2021-01-27 DIAGNOSIS — M6281 Muscle weakness (generalized): Secondary | ICD-10-CM | POA: Diagnosis not present

## 2021-01-27 DIAGNOSIS — G931 Anoxic brain damage, not elsewhere classified: Secondary | ICD-10-CM | POA: Diagnosis not present

## 2021-01-27 DIAGNOSIS — R2689 Other abnormalities of gait and mobility: Secondary | ICD-10-CM | POA: Diagnosis not present

## 2021-01-27 DIAGNOSIS — M25552 Pain in left hip: Secondary | ICD-10-CM | POA: Diagnosis not present

## 2021-01-27 NOTE — Therapy (Signed)
[place] [place], [place], [address] Phone: [phone]   Fax:  [phone]  Physical Therapy Treatment  Patient Details  Name: Timothy Moran MRN: [id number] Date of Birth: December 30, 1945 Referring Provider (PT): Reesa Chew PA-C   Encounter Date: 01/27/2021   PT End of Session - 01/27/21 1320    Visit Number 14    Date for PT Re-Evaluation 69/62/95   Recert 10/20/4130   Authorization Type Medicare    PT Start Time 1315    PT Stop Time 1400    PT Time Calculation (min) 45 min    Equipment Utilized During Treatment Gait belt    Activity Tolerance Patient tolerated treatment well    Behavior During Therapy [facility] for tasks assessed/performed           Past Medical History:  Diagnosis Date  . Abnormal echocardiogram 06/07/2019  . Acute blood loss anemia 10/05/2020  . Acute on chronic combined systolic and diastolic HF (heart failure) ([place]) 10/26/2020  . Acute respiratory failure ([place]), hypothermia therapy, vent - extubated 10/06/20   . AKI (acute kidney injury) ([place]) 10/05/2020  . Anoxic Timothy injury ([place]) 10/26/2020  . Arrhythmia   . Ascending aortic aneurysm (HCC) 4.2 cm based on CT from December 2020 01/29/2018   4.3 cm by echo in 2019  . 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 Benign neoplasm of Timothy ([place]) 12/19/2017  . CAD in native artery residual disease of LAD, may need PCI, treating medically for now 10/26/2020  . Cardiac arrest with ventricular fibrillation ([place])   . Chest pain 12/04/2017  . Chronic kidney disease    Stage 3  . Chronic kidney disease, stage III (moderate) ([place]) 12/19/2017  . Diabetes mellitus, type 2 ([place]) 10/06/2020   10/06/20 A1C 7%  . Dizziness 10/11/2019  . DM (diabetes mellitus), type 2 ([place]) new 10/26/2020  . Encounter for central line placement   . Encounter for imaging study to confirm orogastric (OG) tube placement   . Hematuria 10/26/2020  . History of pulmonary embolus (PE)   . HLD  (hyperlipidemia) 10/26/2020  . Hypertension   . Iron deficiency anemia rec'd IV iron 10/26/2020  . Ischemic cardiomyopathy 10/26/2020  . Left bundle branch block 12/19/2017  . Meningioma ([place]) 08/14/2020  . Nonsustained ventricular tachycardia ([place]) 10/11/2019  . Pain of left hip joint 05/29/2020  . Personal history of pulmonary embolism 12/19/2017  . Pneumonia of both lungs due to infectious organism   . Pulmonary hypertension due to thromboembolism ([place]) 03/09/2018   See echo 12/27/17 with nl PAS vs CTa chest 02/23/18   . S/P angioplasty with stent 10/04/20 DES to LCX  10/26/2020  . Sinus bradycardia 12/22/2017  . SOB (shortness of breath)   . Solitary pulmonary nodule on lung CT 03/08/2018   CT 12/04/17 1.0 x 0.8 x 0.7 cm nodular opacity in the RUL vs not seen 03/16/10 posterior segment of the right upper lobe. Marland KitchenSpirometry 03/08/2018    FEV1 3.86 (108%)  Ratio 96 s prior rx  - PET  03/13/18   Low grade c/w adenoca > rec T surgery eval    . STEMI (ST elevation myocardial infarction) ([place]) 10/04/2020  . Urinary retention 10/26/2020  . Vestibular schwannoma ([place]) 08/14/2020    Past Surgical History:  Procedure Laterality Date  . APPENDECTOMY    . CORONARY/GRAFT ACUTE MI REVASCULARIZATION N/A 10/04/2020   Procedure: Coronary/Graft Acute MI Revascularization;  Surgeon: Nelva Bush, MD;  Location: [facility];  Service: Cardiovascular;  Laterality: N/A;  . HERNIA REPAIR    . LEFT HEART CATH AND CORONARY ANGIOGRAPHY N/A 10/04/2020   Procedure: LEFT HEART CATH AND CORONARY ANGIOGRAPHY;  Surgeon: Nelva Bush, MD;  Location: [facility];  Service: Cardiovascular;  Laterality: N/A;    Vitals:   01/27/21 1328  BP: 119/75     Subjective Assessment - 01/27/21 1317    Subjective has appointment for imaging with neurologist next week the 23rd. Saw cardiologist recently and he says he was told okay to schedule radiation. Denies any falls. Still getting lightheaded    Pertinent History HTN,  arrhythmia, hx of pulmonary embolus, meningiomas, stage III CKD, R upper and lower lobe lung nodules, ascending aortic aneurysm, and L bundle branch block    Patient Stated Goals Getting back to normal - doing activities around the house, yard work. Improve endurance strength and balance.    Currently in Pain? No/denies              [facility] PT Assessment - 01/27/21 0001      6 Minute Walk- Baseline   BP (mmHg) 120/75      6 Minute walk- Post Test   6 Minute Walk Post Test yes    BP (mmHg) 141/77    02 Sat (%RA) 96 %      6 minute walk test results    Aerobic Endurance Distance Walked --   1350 ft = 411 m                        [facility] Adult PT Treatment/Exercise - 01/27/21 0001      Lumbar Exercises: Aerobic   Nustep L5 x 6 min      Lumbar Exercises: Seated   Sit to Stand Limitations x15 with red med ball from elevated mat table      Knee/Hip Exercises: Standing   Walking with Sports Cord 30# x 3 each direction. CGA with occasional min A for LOB espeically with sidestepping R & L.      Knee/Hip Exercises: Seated   Long Arc Quad Strengthening;Both;2 sets;10 reps   5#   Other Seated Knee/Hip Exercises heel/toe raises x 15 with 5# AWs    Hamstring Curl Strengthening;Both;2 sets;10 reps    Hamstring Limitations green TB                    PT Short Term Goals - 12/31/20 1325      PT SHORT TERM GOAL #1   Title Pt will be I and compliant with initial HEP.    Status Achieved             PT Long Term Goals - 01/27/21 1353      PT LONG TERM GOAL #1   Title Pt will return to walking for 45 minutes as daily exercise not limited by shortness of breath    Time 8    Period Weeks    Status On-going   has been doing short walks when he goes out with wife. about 15 min at a time and occasional yard work now     PT [place] #2   Title BLE strength increased to grossly 4+/5    Time 8    Status On-going      PT LONG TERM GOAL #3   Title Pt will  be able to complete 6 MWT distance of at least 500 m with no greater  than 4/10 RPE or Borg Dypsnea scale rating    Baseline 162 m with 3MWT initial eval. 12/31/20: 330 meters on 6MWT able to tolerate without standing rest, RPE 3-4/10. 01/12/2021: 384 m without standing rest. 01/27/21: 411 m    Time 8    Period Weeks    Status On-going      PT LONG TERM GOAL #4   Title Pt will score at least 7 points higher than initial berg score to demo decreased falls risk    Baseline 12/31/20: 38/56. 01/12/2021: 39/56    Time 8    Period Weeks    Status On-going                 Plan - 01/27/21 1321    Clinical Impression Statement Continued reports of lightheadedness which was monitored t/o session and given rest breaks as needed. completed 6 MWT increased distance to 470m making excellent progress toward goal, also no LOB noted today. Next visit is last scheduled and plan to discuss any changes to POC with pt and wife at that time    Personal Factors and Comorbidities Age;Time since onset of injury/illness/exacerbation;Comorbidity 3+;Fitness;Past/Current Experience    Rehab Potential Good    PT Frequency 1x / week    PT Duration 4 weeks    PT Treatment/Interventions ADLs/Self Care Home Management;Neuromuscular re-education;Balance training;Therapeutic exercise;Therapeutic activities;Functional mobility training;Patient/family education;Stair training;Manual techniques;Energy conservation;Taping;Vestibular    PT Next Visit Plan Follow up regarding pt symptoms and follow up with cardio/neuro. Modify PT POC as needed once pt follows up wth specialists. Monitor VS as needed. Slowly progress endurance strength and balance as tolerated. Monitor of dyspnea/SOB on exertion and utilize RPE to grade activity    Consulted and Agree with Plan of Care Patient           Patient will benefit from skilled therapeutic intervention in order to improve the following deficits and impairments:  Abnormal gait,Decreased  strength,Improper body mechanics,Postural dysfunction,Difficulty walking,Cardiopulmonary status limiting activity,Decreased activity tolerance,Decreased balance,Pain,Decreased endurance  Visit Diagnosis: Muscle weakness (generalized)  Anoxic Timothy injury ([place])  Stiffness of left hip, not elsewhere classified  Other abnormalities of gait and mobility  Other symptoms and signs involving the musculoskeletal system     Problem List Patient Active Problem List   Diagnosis Date Noted  . Arrhythmia   . DM (diabetes mellitus), type 2 ([place]) new 10/26/2020  . Hematuria 10/26/2020  . Urinary retention 10/26/2020  . Iron deficiency anemia rec'd IV iron 10/26/2020  . Anoxic Timothy injury ([place]) 10/26/2020  . HLD (hyperlipidemia) 10/26/2020  . CAD in native artery residual disease of LAD, may need PCI, treating medically for now 10/26/2020  . S/P angioplasty with stent 10/04/20 DES to LCX  10/26/2020  . Acute on chronic combined systolic and diastolic HF (heart failure) ([place]) 10/26/2020  . Ischemic cardiomyopathy 10/26/2020  . Pneumonia of both lungs due to infectious organism   . Acute respiratory failure ([place]), hypothermia therapy, vent - extubated 10/06/20   . Diabetes mellitus, type 2 ([place]) 10/06/2020  . AKI (acute kidney injury) ([place]) 10/05/2020  . STEMI (ST elevation myocardial infarction) () 10/04/2020  . Encounter for central line placement   . Encounter for imaging study to confirm orogastric (OG) tube placement   . Meningioma ([place]) 08/14/2020  . Vestibular schwannoma ([place]) 08/14/2020  . Cardiac arrest with ventricular fibrillation ([place])   . Pain of left hip joint 05/29/2020  . 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 Chronic kidney disease   .  History of pulmonary embolus (PE)   . Hypertension   . Nonsustained ventricular tachycardia ([place]) 10/11/2019  . Dizziness 10/11/2019  . Abnormal echocardiogram 06/07/2019  . Pulmonary hypertension due to thromboembolism ([place]) 03/09/2018  .  Solitary pulmonary nodule on lung CT 03/08/2018  . Ascending aortic aneurysm (HCC) 4.2 cm based on CT from December 2020 01/29/2018  . Sinus bradycardia 12/22/2017  . Left bundle branch block 12/19/2017  . Personal history of pulmonary embolism 12/19/2017  . Chronic kidney disease, stage III (moderate) ([place]) 12/19/2017  . Benign neoplasm of Timothy ([place]) 12/19/2017  . Chest pain 12/04/2017    Hall Busing, PT, DPT 01/27/2021, 2:01 PM  [address] [place], [place], [address] Phone: [phone]   Fax:  [phone]  Name: Timothy Moran MRN: [id number] Date of Birth: 08-13-1946

## 2021-01-27 NOTE — Telephone Encounter (Signed)
STAT if patient feels like he/she is going to faint   1) Are you dizzy now? A little bit just got back from physical therapy   2) Do you feel faint or have you passed out? No   3) Do you have any other symptoms? SOB on exertion  4) Have you checked your HR and BP (record if available)? 115/75 130/75 at physical therapy today    Wants appt in regards to this. If Dr. Raliegh Ip cannot seem him requesting a Cardiologist that saw him while hospitalized for heart attack can work him in for sooner. States he's unable to move his head much without getting lightheaded and dizzy.

## 2021-01-28 ENCOUNTER — Telehealth: Payer: Self-pay | Admitting: Cardiology

## 2021-01-28 NOTE — Telephone Encounter (Signed)
New message:     Patient calling to see if a can get a referral. Please call patient.

## 2021-01-28 NOTE — Telephone Encounter (Signed)
Spoke to the patient just now and he let me know that he was needing a referral to a urologist. I advised that he needed to contact his PCP in regards to this and he verbalizes understanding.

## 2021-01-29 NOTE — Telephone Encounter (Signed)
Called patient informed him of Dr. Wendy Poet message below. He understood he will check with neurologist and let us know if he needs anything further.

## 2021-02-02 ENCOUNTER — Encounter: Payer: Self-pay | Admitting: Internal Medicine

## 2021-02-02 DIAGNOSIS — R001 Bradycardia, unspecified: Secondary | ICD-10-CM | POA: Diagnosis not present

## 2021-02-02 DIAGNOSIS — I259 Chronic ischemic heart disease, unspecified: Secondary | ICD-10-CM | POA: Diagnosis not present

## 2021-02-02 DIAGNOSIS — D320 Benign neoplasm of cerebral meninges: Secondary | ICD-10-CM | POA: Diagnosis not present

## 2021-02-02 DIAGNOSIS — Z8679 Personal history of other diseases of the circulatory system: Secondary | ICD-10-CM | POA: Diagnosis not present

## 2021-02-02 DIAGNOSIS — Z8674 Personal history of sudden cardiac arrest: Secondary | ICD-10-CM | POA: Diagnosis not present

## 2021-02-02 DIAGNOSIS — D62 Acute posthemorrhagic anemia: Secondary | ICD-10-CM | POA: Diagnosis not present

## 2021-02-02 DIAGNOSIS — I509 Heart failure, unspecified: Secondary | ICD-10-CM | POA: Diagnosis not present

## 2021-02-02 DIAGNOSIS — N401 Enlarged prostate with lower urinary tract symptoms: Secondary | ICD-10-CM | POA: Diagnosis not present

## 2021-02-02 DIAGNOSIS — I25118 Atherosclerotic heart disease of native coronary artery with other forms of angina pectoris: Secondary | ICD-10-CM | POA: Diagnosis not present

## 2021-02-02 DIAGNOSIS — N1832 Chronic kidney disease, stage 3b: Secondary | ICD-10-CM | POA: Diagnosis not present

## 2021-02-02 DIAGNOSIS — I712 Thoracic aortic aneurysm, without rupture: Secondary | ICD-10-CM | POA: Diagnosis not present

## 2021-02-03 ENCOUNTER — Ambulatory Visit: Payer: Medicare Other

## 2021-02-03 ENCOUNTER — Other Ambulatory Visit: Payer: Self-pay

## 2021-02-03 DIAGNOSIS — G931 Anoxic brain damage, not elsewhere classified: Secondary | ICD-10-CM

## 2021-02-03 DIAGNOSIS — M6281 Muscle weakness (generalized): Secondary | ICD-10-CM | POA: Diagnosis not present

## 2021-02-03 DIAGNOSIS — R2689 Other abnormalities of gait and mobility: Secondary | ICD-10-CM | POA: Diagnosis not present

## 2021-02-03 DIAGNOSIS — M25652 Stiffness of left hip, not elsewhere classified: Secondary | ICD-10-CM | POA: Diagnosis not present

## 2021-02-03 DIAGNOSIS — R29898 Other symptoms and signs involving the musculoskeletal system: Secondary | ICD-10-CM | POA: Diagnosis not present

## 2021-02-03 DIAGNOSIS — M25552 Pain in left hip: Secondary | ICD-10-CM

## 2021-02-03 NOTE — Patient Instructions (Signed)
Access Code: [email or web address] URL: [email or web address] Date: 02/03/2021 Prepared by: Sherlynn Stalls  Please complete at least 3 standing and/or laying down exercises, minimum 3-4 times each week Standing exercises: Sit to Stand with Counter Support - 1 x daily - 7 x weekly - 2 sets - 10 reps Standing March with Counter Support - 1 x daily - 7 x weekly - 2 sets - 10 reps Tandem Walking with Counter Support - 1 x daily - 7 x weekly - 10 sets Side Stepping with Counter Support - 1 x daily - 7 x weekly - 10 sets Standing Single Leg Stance with Counter Support - 1 x daily - 7 x weekly - 3 sets - 10 reps Standing Knee Flexion with Counter Support - 1 x daily - 7 x weekly - 2 sets - 10 reps   Supine exercises: Supine March with Resistance Band - 1 x daily - 7 x weekly - 3 sets - 10 reps Supine Bridge - 1 x daily - 7 x weekly - 3 sets - 10 reps Active Straight Leg Raise with Quad Set - 1 x daily - 7 x weekly - 3 sets - 10 reps

## 2021-02-03 NOTE — Therapy (Signed)
[facility]. [place], [place], [address] Phone: [phone]   Fax:  [phone]  Physical Therapy Treatment  Patient Details  Name: Timothy Moran MRN: [id number] Date of Birth: 1945-09-21 Referring Provider (PT): Reesa Chew PA-C   Encounter Date: 02/03/2021   PT End of Session - 02/03/21 1316    Visit Number 16    Date for PT Re-Evaluation 26/71/24   Recert 01/17/997   Authorization Type Medicare    PT Start Time 1310    PT Stop Time 1350    PT Time Calculation (min) 40 min    Equipment Utilized During Treatment --    Activity Tolerance Patient tolerated treatment well    Behavior During Therapy [facility] for tasks assessed/performed           Past Medical History:  Diagnosis Date  . Abnormal echocardiogram 06/07/2019  . Acute blood loss anemia 10/05/2020  . Acute on chronic combined systolic and diastolic HF (heart failure) ([place]) 10/26/2020  . Acute respiratory failure ([place]), hypothermia therapy, vent - extubated 10/06/20   . AKI (acute kidney injury) ([place]) 10/05/2020  . Anoxic brain injury ([place]) 10/26/2020  . Arrhythmia   . Ascending aortic aneurysm (HCC) 4.2 cm based on CT from December 2020 01/29/2018   4.3 cm by echo in 2019  . Benign brain tumor ([place])   . Benign neoplasm of brain ([place]) 12/19/2017  . CAD in native artery residual disease of LAD, may need PCI, treating medically for now 10/26/2020  . Cardiac arrest with ventricular fibrillation ([place])   . Chest pain 12/04/2017  . Chronic kidney disease    Stage 3  . Chronic kidney disease, stage III (moderate) ([place]) 12/19/2017  . Diabetes mellitus, type 2 ([place]) 10/06/2020   10/06/20 A1C 7%  . Dizziness 10/11/2019  . DM (diabetes mellitus), type 2 ([place]) new 10/26/2020  . Encounter for central line placement   . Encounter for imaging study to confirm orogastric (OG) tube placement   . Hematuria 10/26/2020  . History of pulmonary embolus (PE)   . HLD  (hyperlipidemia) 10/26/2020  . Hypertension   . Iron deficiency anemia rec'd IV iron 10/26/2020  . Ischemic cardiomyopathy 10/26/2020  . Left bundle branch block 12/19/2017  . Meningioma ([place]) 08/14/2020  . Nonsustained ventricular tachycardia ([place]) 10/11/2019  . Pain of left hip joint 05/29/2020  . Personal history of pulmonary embolism 12/19/2017  . Pneumonia of both lungs due to infectious organism   . Pulmonary hypertension due to thromboembolism ([place]) 03/09/2018   See echo 12/27/17 with nl PAS vs CTa chest 02/23/18   . S/P angioplasty with stent 10/04/20 DES to LCX  10/26/2020  . Sinus bradycardia 12/22/2017  . SOB (shortness of breath)   . Solitary pulmonary nodule on lung CT 03/08/2018   CT 12/04/17 1.0 x 0.8 x 0.7 cm nodular opacity in the RUL vs not seen 03/16/10 posterior segment of the right upper lobe. Marland KitchenSpirometry 03/08/2018    FEV1 3.86 (108%)  Ratio 96 s prior rx  - PET  03/13/18   Low grade c/w adenoca > rec T surgery eval    . STEMI (ST elevation myocardial infarction) ([place]) 10/04/2020  . Urinary retention 10/26/2020  . Vestibular schwannoma ([place]) 08/14/2020    Past Surgical History:  Procedure Laterality Date  . APPENDECTOMY    . CORONARY/GRAFT ACUTE MI REVASCULARIZATION N/A 10/04/2020   Procedure: Coronary/Graft Acute MI Revascularization;  Surgeon: Nelva Bush, MD;  Location: [facility];  Service: Cardiovascular;  Laterality: N/A;  . HERNIA REPAIR    . LEFT HEART CATH AND CORONARY ANGIOGRAPHY N/A 10/04/2020   Procedure: LEFT HEART CATH AND CORONARY ANGIOGRAPHY;  Surgeon: Nelva Bush, MD;  Location: [facility];  Service: Cardiovascular;  Laterality: N/A;    There were no vitals filed for this visit.   Subjective Assessment - 02/03/21 1311    Subjective NEURO apptmt tomorrow at [facility]. Saw PCP. Still lightheaded/dizzy.    Pertinent History HTN, arrhythmia, hx of pulmonary embolus, meningiomas, stage III CKD, R upper and lower lobe lung nodules, ascending aortic  aneurysm, and L bundle branch block    Patient Stated Goals Getting back to normal - doing activities around the house, yard work. Improve endurance strength and balance.    Currently in Pain? No/denies                             [facility] Adult PT Treatment/Exercise - 02/03/21 0001      High Level Balance   High Level Balance Activities Tandem walking;Backward walking    High Level Balance Comments sidestepping up and over airex x 5 laps back and forth. SLS with BUE support in  bars 10 sec x 5 reps each leg      Lumbar Exercises: Seated   Sit to Stand 10 reps   from armchair, UE assist     Knee/Hip Exercises: Standing   Heel Raises Both;2 sets;10 reps    Heel Raises Limitations 3# AW    Other Standing Knee Exercises knee bends, hip abd and march 2 x  10 alt B, 3# AW                  PT Education - 02/03/21 1401    Education Details Access Code: [email or web address]. Updated HEP for continued strength and balance. Discussed utilization of supine exercises moreso especially on days where he may feel more dizzy. Also educated on use of AD as needed for stability in walking to prevent falls    Person(s) Educated Patient    Methods Explanation;Demonstration;Handout    Comprehension Verbalized understanding            PT Short Term Goals - 12/31/20 1325      PT SHORT TERM GOAL #1   Title Pt will be I and compliant with initial HEP.    Status Achieved             PT Long Term Goals - 01/27/21 1353      PT LONG TERM GOAL #1   Title Pt will return to walking for 45 minutes as daily exercise not limited by shortness of breath    Time 8    Period Weeks    Status On-going   has been doing short walks when he goes out with wife. about 15 min at a time and occasional yard work now     [place] #2   Title BLE strength increased to grossly 4+/5    Time 8    Status On-going      PT LONG TERM GOAL #3   Title Pt will be able to complete 6 MWT distance of  at least 500 m with no greater than 4/10 RPE or Borg Dypsnea scale rating    Baseline 162 m with 3MWT initial eval. 12/31/20: 330 meters on 6MWT able to tolerate without standing rest, RPE 3-4/10. 01/12/2021: 384 m without standing  rest. 01/27/21: 411 m    Time 8    Period Weeks    Status On-going      PT LONG TERM GOAL #4   Title Pt will score at least 7 points higher than initial berg score to demo decreased falls risk    Baseline 12/31/20: 38/56. 01/12/2021: 39/56    Time 8    Period Weeks    Status On-going                 Plan - 02/03/21 1318    Clinical Impression Statement Rein overall doing fairly well form a strength and endurance perspective, abel to toelrate standing exercises without exacberation of lightheadedness or dizzy symptoms today. Head turns continue to exacerbate symptoms. Discussed with pt and wife hold from PT at this time with updated HEP, they to plan to consult with neurologist regarding dizziness ( Pt is scheduled for follow up with neurologist tomorrow). Pt/wife to contact clinic regarding continued PT.    Personal Factors and Comorbidities Age;Time since onset of injury/illness/exacerbation;Comorbidity 3+;Fitness;Past/Current Experience    Rehab Potential Good    PT Frequency 1x / week    PT Duration 4 weeks    PT Treatment/Interventions ADLs/Self Care Home Management;Neuromuscular re-education;Balance training;Therapeutic exercise;Therapeutic activities;Functional mobility training;Patient/family education;Stair training;Manual techniques;Energy conservation;Taping;Vestibular    PT Next Visit Plan Plan to place on hold for PT  for 30 days, will be seeing neurologist tomorrow and will determine at that time medical management and/or changes to PT POC    Consulted and Agree with Plan of Care Patient           Patient will benefit from skilled therapeutic intervention in order to improve the following deficits and impairments:  Abnormal gait,Decreased  strength,Improper body mechanics,Postural dysfunction,Difficulty walking,Cardiopulmonary status limiting activity,Decreased activity tolerance,Decreased balance,Pain,Decreased endurance  Visit Diagnosis: Muscle weakness (generalized)  Anoxic brain injury ([place])  Stiffness of left hip, not elsewhere classified  Other abnormalities of gait and mobility  Pain in left hip     Problem List Patient Active Problem List   Diagnosis Date Noted  . Arrhythmia   . DM (diabetes mellitus), type 2 ([place]) new 10/26/2020  . Hematuria 10/26/2020  . Urinary retention 10/26/2020  . Iron deficiency anemia rec'd IV iron 10/26/2020  . Anoxic brain injury ([place]) 10/26/2020  . HLD (hyperlipidemia) 10/26/2020  . CAD in native artery residual disease of LAD, may need PCI, treating medically for now 10/26/2020  . S/P angioplasty with stent 10/04/20 DES to LCX  10/26/2020  . Acute on chronic combined systolic and diastolic HF (heart failure) ([place]) 10/26/2020  . Ischemic cardiomyopathy 10/26/2020  . Pneumonia of both lungs due to infectious organism   . Acute respiratory failure ([place]), hypothermia therapy, vent - extubated 10/06/20   . Diabetes mellitus, type 2 ([place]) 10/06/2020  . AKI (acute kidney injury) ([place]) 10/05/2020  . STEMI (ST elevation myocardial infarction) ([place]) 10/04/2020  . Encounter for central line placement   . Encounter for imaging study to confirm orogastric (OG) tube placement   . Meningioma ([place]) 08/14/2020  . Vestibular schwannoma ([place]) 08/14/2020  . Cardiac arrest with ventricular fibrillation ([place])   . Pain of left hip joint 05/29/2020  . Benign brain tumor ([place])   . Chronic kidney disease   . History of pulmonary embolus (PE)   . Hypertension   . Nonsustained ventricular tachycardia ([place]) 10/11/2019  . Dizziness 10/11/2019  . Abnormal echocardiogram 06/07/2019  . Pulmonary hypertension due to thromboembolism ([place]) 03/09/2018  .  Solitary pulmonary nodule on lung CT 03/08/2018   . Ascending aortic aneurysm (HCC) 4.2 cm based on CT from December 2020 01/29/2018  . Sinus bradycardia 12/22/2017  . Left bundle branch block 12/19/2017  . Personal history of pulmonary embolism 12/19/2017  . Chronic kidney disease, stage III (moderate) ([place]) 12/19/2017  . Benign neoplasm of brain ([place]) 12/19/2017  . Chest pain 12/04/2017    Hall Busing, PT, DPT 02/03/2021, 2:25 PM  [facility] [place], [place], [address] Phone: [phone]   Fax:  [phone]  Name: Laurence Crofford MRN: [id number] Date of Birth: 05/18/1946

## 2021-02-04 DIAGNOSIS — D321 Benign neoplasm of spinal meninges: Secondary | ICD-10-CM | POA: Diagnosis not present

## 2021-02-10 NOTE — Addendum Note (Signed)
Addended by: Truddie Hidden on: 02/10/2021 08:55 AM   Modules accepted: Orders

## 2021-02-16 DIAGNOSIS — R338 Other retention of urine: Secondary | ICD-10-CM | POA: Diagnosis not present

## 2021-02-19 ENCOUNTER — Other Ambulatory Visit: Payer: Self-pay | Admitting: Physical Medicine and Rehabilitation

## 2021-02-23 ENCOUNTER — Telehealth: Payer: Self-pay | Admitting: Cardiology

## 2021-02-23 DIAGNOSIS — I2510 Atherosclerotic heart disease of native coronary artery without angina pectoris: Secondary | ICD-10-CM | POA: Diagnosis not present

## 2021-02-23 DIAGNOSIS — I129 Hypertensive chronic kidney disease with stage 1 through stage 4 chronic kidney disease, or unspecified chronic kidney disease: Secondary | ICD-10-CM | POA: Diagnosis not present

## 2021-02-23 DIAGNOSIS — N400 Enlarged prostate without lower urinary tract symptoms: Secondary | ICD-10-CM | POA: Diagnosis not present

## 2021-02-23 DIAGNOSIS — N189 Chronic kidney disease, unspecified: Secondary | ICD-10-CM | POA: Diagnosis not present

## 2021-02-23 DIAGNOSIS — N1832 Chronic kidney disease, stage 3b: Secondary | ICD-10-CM | POA: Diagnosis not present

## 2021-02-23 DIAGNOSIS — I5022 Chronic systolic (congestive) heart failure: Secondary | ICD-10-CM | POA: Diagnosis not present

## 2021-02-23 DIAGNOSIS — D631 Anemia in chronic kidney disease: Secondary | ICD-10-CM | POA: Diagnosis not present

## 2021-02-23 DIAGNOSIS — N179 Acute kidney failure, unspecified: Secondary | ICD-10-CM | POA: Diagnosis not present

## 2021-02-23 NOTE — Telephone Encounter (Signed)
New Message:     Pt wants to know who put his stent in when he was in [facility] in January?

## 2021-02-23 NOTE — Telephone Encounter (Signed)
Spoke with patient about the provider who inserted his stent. Patient verbalizes understanding. No further questions or concerns at this time.

## 2021-02-26 ENCOUNTER — Other Ambulatory Visit: Payer: Self-pay

## 2021-02-26 ENCOUNTER — Ambulatory Visit (HOSPITAL_COMMUNITY): Payer: Medicare Other | Attending: Cardiology

## 2021-02-26 DIAGNOSIS — I255 Ischemic cardiomyopathy: Secondary | ICD-10-CM | POA: Insufficient documentation

## 2021-02-26 DIAGNOSIS — I2510 Atherosclerotic heart disease of native coronary artery without angina pectoris: Secondary | ICD-10-CM | POA: Insufficient documentation

## 2021-02-26 DIAGNOSIS — R001 Bradycardia, unspecified: Secondary | ICD-10-CM | POA: Diagnosis not present

## 2021-02-26 DIAGNOSIS — I4901 Ventricular fibrillation: Secondary | ICD-10-CM | POA: Diagnosis not present

## 2021-02-26 DIAGNOSIS — N1832 Chronic kidney disease, stage 3b: Secondary | ICD-10-CM | POA: Diagnosis not present

## 2021-02-26 DIAGNOSIS — I4729 Other ventricular tachycardia: Secondary | ICD-10-CM

## 2021-02-26 DIAGNOSIS — I2729 Other secondary pulmonary hypertension: Secondary | ICD-10-CM | POA: Insufficient documentation

## 2021-02-26 DIAGNOSIS — I447 Left bundle-branch block, unspecified: Secondary | ICD-10-CM | POA: Diagnosis not present

## 2021-02-26 DIAGNOSIS — I472 Ventricular tachycardia: Secondary | ICD-10-CM | POA: Insufficient documentation

## 2021-02-26 DIAGNOSIS — I2699 Other pulmonary embolism without acute cor pulmonale: Secondary | ICD-10-CM

## 2021-02-26 DIAGNOSIS — I469 Cardiac arrest, cause unspecified: Secondary | ICD-10-CM | POA: Diagnosis not present

## 2021-02-26 DIAGNOSIS — N390 Urinary tract infection, site not specified: Secondary | ICD-10-CM | POA: Diagnosis not present

## 2021-02-26 LAB — ECHOCARDIOGRAM LIMITED
Area-P 1/2: 2.5 cm2
S' Lateral: 4.7 cm

## 2021-02-26 MED ORDER — PERFLUTREN LIPID MICROSPHERE
1.0000 mL | INTRAVENOUS | Status: AC | PRN
  Administered 2021-02-26: 2 mL via INTRAVENOUS

## 2021-03-17 DIAGNOSIS — R350 Frequency of micturition: Secondary | ICD-10-CM | POA: Diagnosis not present

## 2021-03-17 DIAGNOSIS — N401 Enlarged prostate with lower urinary tract symptoms: Secondary | ICD-10-CM | POA: Diagnosis not present

## 2021-03-17 DIAGNOSIS — R338 Other retention of urine: Secondary | ICD-10-CM | POA: Diagnosis not present

## 2021-03-23 ENCOUNTER — Telehealth: Payer: Self-pay | Admitting: *Deleted

## 2021-03-23 NOTE — Telephone Encounter (Signed)
Called and spoke with patient to get a better understanding for his visit with Dr. Saunders Revel next week. Per patient he would like to come in to discuss maybe having another stent placed because when the first one was done he had a 80% blockage but could not proceed due to some underlying issues with his kidney. Now that his kidney is doing better he would like to discuss having the procedure done and since Dr. Saunders Revel performed the first one, he would like for him to do the next one as well if necessary.

## 2021-03-25 NOTE — Telephone Encounter (Signed)
We will discuss further workup/intervention at visit scheduled next week.  Nelva Bush, MD [facility]

## 2021-03-26 ENCOUNTER — Other Ambulatory Visit: Payer: Self-pay | Admitting: Physical Medicine and Rehabilitation

## 2021-03-29 NOTE — Progress Notes (Signed)
Follow-up Outpatient Visit Date: 03/31/2021  Primary Care Provider: Cari Caraway, [place] [address]  Chief Complaint: Heart blockage  HPI:  Timothy Moran is a 75 y.o. male with history of coronary artery disease with inferior STEMI complicated by cardiac arrest status post primary PCI to distal LCx with residual LAD disease, chronic HFrEF, PE x2, hypertension, hyperlipidemia, thoracic aortic aneurysm, and meningioma who presents for discussion of possible PCI to LAD.  His hospitalization at the time of his inferior STEMI and cardiac arrest in January was complicated by acute kidney injury.  His renal function has improved with creatinine hovering near 2.  Since leaving the hospital, Timothy Moran has experienced continued generalized weakness and exertional dyspnea with mild activity.  He notes sporadic pain in the left side of his chest with sneezing or coughing but no exertional chest discomfort.  He has not had any orthopnea or edema.  Timothy Moran has an indwelling Foley catheter due to urinary tract obstruction related to his prostate.  He would like to undergo urologic intervention, though this has been deferred thus far due to ongoing DAPT as well as his underlying cardiac disease.  --------------------------------------------------------------------------------------------------  Past Medical History:  Diagnosis Date   Abnormal echocardiogram 06/07/2019   Acute blood loss anemia 10/05/2020   Acute on chronic combined systolic and diastolic HF (heart failure) ([place]) 10/26/2020   Acute respiratory failure ([place]), hypothermia therapy, vent - extubated 10/06/20    AKI (acute kidney injury) ([place]) 10/05/2020   Anoxic brain injury ([place]) 10/26/2020   Arrhythmia    Ascending aortic aneurysm (HCC) 4.2 cm based on CT from December 2020 01/29/2018   4.3 cm by echo in 2019   Benign brain tumor ([place])    Benign neoplasm of brain ([place]) 12/19/2017   CAD in native artery  residual disease of LAD, may need PCI, treating medically for now 10/26/2020   Cardiac arrest with ventricular fibrillation (HCC)    Chest pain 12/04/2017   Chronic kidney disease    Stage 3   Chronic kidney disease, stage III (moderate) ([place]) 12/19/2017   Diabetes mellitus, type 2 ([place]) 10/06/2020   10/06/20 A1C 7%   Dizziness 10/11/2019   DM (diabetes mellitus), type 2 ([place]) new 10/26/2020   Encounter for central line placement    Encounter for imaging study to confirm orogastric (OG) tube placement    Hematuria 10/26/2020   History of pulmonary embolus (PE)    HLD (hyperlipidemia) 10/26/2020   Hypertension    Iron deficiency anemia rec'd IV iron 10/26/2020   Ischemic cardiomyopathy 10/26/2020   Left bundle branch block 12/19/2017   Meningioma ([place]) 08/14/2020   Nonsustained ventricular tachycardia ([place]) 10/11/2019   Pain of left hip joint 05/29/2020   Personal history of pulmonary embolism 12/19/2017   Pneumonia of both lungs due to infectious organism    Pulmonary hypertension due to thromboembolism ([place]) 03/09/2018   See echo 12/27/17 with nl PAS vs CTa chest 02/23/18    S/P angioplasty with stent 10/04/20 DES to LCX  10/26/2020   Sinus bradycardia 12/22/2017   SOB (shortness of breath)    Solitary pulmonary nodule on lung CT 03/08/2018   CT 12/04/17 1.0 x 0.8 x 0.7 cm nodular opacity in the RUL vs not seen 03/16/10 posterior segment of the right upper lobe. Marland KitchenSpirometry 03/08/2018    FEV1 3.86 (108%)  Ratio 96 s prior rx  - PET  03/13/18   Low grade c/w adenoca > rec T surgery eval  STEMI (ST elevation myocardial infarction) ([place]) 10/04/2020   Urinary retention 10/26/2020   Vestibular schwannoma ([place]) 08/14/2020   Past Surgical History:  Procedure Laterality Date   APPENDECTOMY     CARDIAC CATHETERIZATION     CORONARY/GRAFT ACUTE MI REVASCULARIZATION N/A 10/04/2020   Procedure: Coronary/Graft Acute MI Revascularization;  Surgeon: Nelva Bush, MD;  Location: [facility];  Service:  Cardiovascular;  Laterality: N/A;   HERNIA REPAIR     LEFT HEART CATH AND CORONARY ANGIOGRAPHY N/A 10/04/2020   Procedure: LEFT HEART CATH AND CORONARY ANGIOGRAPHY;  Surgeon: Nelva Bush, MD;  Location: [facility];  Service: Cardiovascular;  Laterality: N/A;    Current Meds  Medication Sig   acetaminophen (TYLENOL) 325 MG tablet Take 1-2 tablets (325-650 mg total) by mouth every 4 (four) hours as needed for mild pain.   aspirin 81 MG chewable tablet Chew 1 tablet (81 mg total) by mouth daily.   atorvastatin (LIPITOR) 80 MG tablet Take 1 tablet (80 mg total) by mouth daily.   B Complex-C (SUPER B COMPLEX PO) Take 1 tablet by mouth daily. Unknown strength   docusate sodium (COLACE) 100 MG capsule Take 2 capsules (200 mg total) by mouth daily.   finasteride (PROSCAR) 5 MG tablet Take 5 mg by mouth daily.   hydrALAZINE (APRESOLINE) 10 MG tablet Take 1 tablet (10 mg total) by mouth 3 (three) times daily.   hydrALAZINE (APRESOLINE) 25 MG tablet Take 25 mg by mouth 3 (three) times daily.   isosorbide dinitrate (ISORDIL) 30 MG tablet Take 1 tablet (30 mg total) by mouth 2 (two) times daily.   melatonin 5 MG TABS Take 1 tablet (5 mg total) by mouth at bedtime.   metoprolol succinate (TOPROL-XL) 50 MG 24 hr tablet Take 1 tablet (50 mg total) by mouth daily. Take with or immediately following a meal.   Multiple Vitamin (MULTIVITAMIN WITH MINERALS) TABS tablet Take 1 tablet by mouth daily.   rosuvastatin (CRESTOR) 20 MG tablet Take 20 mg by mouth at bedtime.   senna-docusate (SENOKOT-S) 8.6-50 MG tablet Take 1 tablet by mouth at bedtime.   sodium bicarbonate 650 MG tablet Take 2 tablets (1,300 mg total) by mouth 3 (three) times daily.   tamsulosin (FLOMAX) 0.4 MG CAPS capsule Take 1 capsule (0.4 mg total) by mouth daily.   ticagrelor (BRILINTA) 90 MG TABS tablet Take 1 tablet (90 mg total) by mouth 2 (two) times daily.   traZODone (DESYREL) 50 MG tablet Take 1 tablet (50 mg total) by mouth  at bedtime.   valsartan (DIOVAN) 160 MG tablet Take 160 mg by mouth daily.    Allergies: Patient has no known allergies.  Social History   Tobacco Use   Smoking status: Former    Packs/day: 0.50    Years: 5.00    Pack years: 2.50    Types: Cigarettes    Quit date: 09/12/1968    Years since quitting: 52.5   Smokeless tobacco: Never  Vaping Use   Vaping Use: Never used  Substance Use Topics   Alcohol use: Yes    Comment: ONE PER WEEK   Drug use: Never    Family History  Problem Relation Age of Onset   Diabetes Mother    Brain cancer Sister    Melanoma Brother        Radiation Exposure   Leukemia Brother        Agent Orange    Review of Systems: A 12-system review of systems was  performed and was negative except as noted in the HPI.  --------------------------------------------------------------------------------------------------  Physical Exam: BP 110/70 (BP Location: Left Arm, Patient Position: Sitting, Cuff Size: Large)   Pulse 67   Ht 6\' 1"  (1.854 m)   Wt 227 lb (103 kg)   SpO2 97%   BMI 29.95 kg/m   General:  NAD. Neck: No JVD or HJR. Lungs: Clear to auscultation bilaterally without wheezes or crackles. Heart: Regular rate and rhythm without murmurs, rubs, or gallops. Abdomen: Soft, nontender, nondistended. Extremities: No lower extremity edema.  EKG: Normal sinus rhythm with PACs, first-degree AV block, left axis deviation, and left bundle branch block.  Compared with prior tracing on 01/26/2021, PVCs are no longer present.  Otherwise, no significant interval change.  Lab Results  Component Value Date   WBC 6.6 11/02/2020   HGB 9.0 (L) 11/02/2020   HCT 27.2 (L) 11/02/2020   MCV 94.1 11/02/2020   PLT 284 11/02/2020    Lab Results  Component Value Date   NA 141 12/28/2020   K 4.7 12/28/2020   CL 107 (H) 12/28/2020   CO2 17 (L) 12/28/2020   BUN 32 (H) 12/28/2020   CREATININE 2.07 (H) 12/28/2020   GLUCOSE 109 (H) 12/28/2020   ALT 50 (H)  10/30/2020    Lab Results  Component Value Date   CHOL 114 10/06/2020   HDL 39 (L) 10/06/2020   LDLCALC 42 10/06/2020   TRIG 163 (H) 10/06/2020   CHOLHDL 2.9 10/06/2020    --------------------------------------------------------------------------------------------------  ASSESSMENT AND PLAN: Coronary artery disease: Timothy Moran does not have typical angina but has continued exertional dyspnea with mild activity.  Query if this represents his anginal equivalent or is predominantly due to his chronic HFrEF.  We have reviewed his catheterization films from January in depth, which showed multifocal proximal/mid LAD disease of up to 80%, which was not intervened upon at the time of his STEMI.  Treatment options include continued GDMT, noninvasive ischemia testing to assess ischemic burden, and repeat catheterization with plan for PCI to the LAD.  As Timothy Moran is already on good antianginal and evidence-based heart failure therapy with continued symptoms and severely reduced LVEF, I think revascularization of the LAD would offer him the greatest potential for improvement in his cardiomyopathy and symptoms.  We discussed his elevated risk of contrast-induced nephropathy in the setting of chronic kidney disease.  We will plan for 4 hours of prehydration and overnight admission for gentle postprocedure hydration and monitoring in the setting of his severely reduced LVEF.  Timothy Moran will need to remain on dual antiplatelet therapy with aspirin and ticagrelor.  Chronic HFrEF and dyspnea on exertion: LVEF remains severely reduced (25-30% by echo last month).  Timothy Moran reports NYHA class III symptoms but appears euvolemic on examination today.  I will defer escalation of his evidence-based heart failure therapy today, which has been managed by Dr. Agustin Moran.  As outlined above, we have agreed to pursue repeat catheterization with likely PCI to the LAD, as outlined above.  If his LVEF does not  improve in 3 months after PCI, CRT-D will need to be considered.  Chronic kidney disease: Creatinine relatively stable on most recent follow-up with nephrology.  We will continue current GDMT including valsartan.  In anticipation of upcoming catheterization, we will repeat BMP a few days before his catheterization with plans for gentle rehydration x4 hours.  Other nephrotoxic agents should be avoided.  Hyperlipidemia: Continue atorvastatin 80 mg daily.  Urinary retention: Continue indwelling Foley.  I would favor deferring nonemergent urologic procedures until after revascularization of the LAD and hopefully improvement in Timothy Moran LVEF.  If possible, he should continue with dual antiplatelet therapy through 09/2021.  Shared Decision Making/Informed Consent The risks [stroke (1 in 1000), death (1 in 1000), kidney failure [usually temporary] (1 in 500), bleeding (1 in 200), allergic reaction [possibly serious] (1 in 200)], benefits (diagnostic support and management of coronary artery disease) and alternatives of a cardiac catheterization were discussed in detail with Timothy Moran and he is willing to proceed.  Follow-up: To be determined based on cath/PCI.  Approximately 45 minutes were spent on this encounter, of which more than half was spent counseling the patient and his wife face-to-face.  Nelva Bush, MD 03/31/2021 9:27 AM

## 2021-03-29 NOTE — H&P (View-Only) (Signed)
Follow-up Outpatient Visit Date: 03/31/2021  Primary Care Provider: Cari Caraway, [place] [address]  Chief Complaint: Heart blockage  HPI:  Mr. Doo is a 75 y.o. male with history of coronary artery disease with inferior STEMI complicated by cardiac arrest status post primary PCI to distal LCx with residual LAD disease, chronic HFrEF, PE x2, hypertension, hyperlipidemia, thoracic aortic aneurysm, and meningioma who presents for discussion of possible PCI to LAD.  His hospitalization at the time of his inferior STEMI and cardiac arrest in January was complicated by acute kidney injury.  His renal function has improved with creatinine hovering near 2.  Since leaving the hospital, Mr. Seeman has experienced continued generalized weakness and exertional dyspnea with mild activity.  He notes sporadic pain in the left side of his chest with sneezing or coughing but no exertional chest discomfort.  He has not had any orthopnea or edema.  Mr. Quevedo has an indwelling Foley catheter due to urinary tract obstruction related to his prostate.  He would like to undergo urologic intervention, though this has been deferred thus far due to ongoing DAPT as well as his underlying cardiac disease.  --------------------------------------------------------------------------------------------------  Past Medical History:  Diagnosis Date   Abnormal echocardiogram 06/07/2019   Acute blood loss anemia 10/05/2020   Acute on chronic combined systolic and diastolic HF (heart failure) ([place]) 10/26/2020   Acute respiratory failure ([place]), hypothermia therapy, vent - extubated 10/06/20    AKI (acute kidney injury) ([place]) 10/05/2020   Anoxic brain injury ([place]) 10/26/2020   Arrhythmia    Ascending aortic aneurysm (HCC) 4.2 cm based on CT from December 2020 01/29/2018   4.3 cm by echo in 2019   Benign brain tumor ([place])    Benign neoplasm of brain ([place]) 12/19/2017   CAD in native artery  residual disease of LAD, may need PCI, treating medically for now 10/26/2020   Cardiac arrest with ventricular fibrillation (HCC)    Chest pain 12/04/2017   Chronic kidney disease    Stage 3   Chronic kidney disease, stage III (moderate) ([place]) 12/19/2017   Diabetes mellitus, type 2 ([place]) 10/06/2020   10/06/20 A1C 7%   Dizziness 10/11/2019   DM (diabetes mellitus), type 2 ([place]) new 10/26/2020   Encounter for central line placement    Encounter for imaging study to confirm orogastric (OG) tube placement    Hematuria 10/26/2020   History of pulmonary embolus (PE)    HLD (hyperlipidemia) 10/26/2020   Hypertension    Iron deficiency anemia rec'd IV iron 10/26/2020   Ischemic cardiomyopathy 10/26/2020   Left bundle branch block 12/19/2017   Meningioma ([place]) 08/14/2020   Nonsustained ventricular tachycardia ([place]) 10/11/2019   Pain of left hip joint 05/29/2020   Personal history of pulmonary embolism 12/19/2017   Pneumonia of both lungs due to infectious organism    Pulmonary hypertension due to thromboembolism ([place]) 03/09/2018   See echo 12/27/17 with nl PAS vs CTa chest 02/23/18    S/P angioplasty with stent 10/04/20 DES to LCX  10/26/2020   Sinus bradycardia 12/22/2017   SOB (shortness of breath)    Solitary pulmonary nodule on lung CT 03/08/2018   CT 12/04/17 1.0 x 0.8 x 0.7 cm nodular opacity in the RUL vs not seen 03/16/10 posterior segment of the right upper lobe. Marland KitchenSpirometry 03/08/2018    FEV1 3.86 (108%)  Ratio 96 s prior rx  - PET  03/13/18   Low grade c/w adenoca > rec T surgery eval  STEMI (ST elevation myocardial infarction) ([place]) 10/04/2020   Urinary retention 10/26/2020   Vestibular schwannoma ([place]) 08/14/2020   Past Surgical History:  Procedure Laterality Date   APPENDECTOMY     CARDIAC CATHETERIZATION     CORONARY/GRAFT ACUTE MI REVASCULARIZATION N/A 10/04/2020   Procedure: Coronary/Graft Acute MI Revascularization;  Surgeon: Nelva Bush, MD;  Location: [facility];  Service:  Cardiovascular;  Laterality: N/A;   HERNIA REPAIR     LEFT HEART CATH AND CORONARY ANGIOGRAPHY N/A 10/04/2020   Procedure: LEFT HEART CATH AND CORONARY ANGIOGRAPHY;  Surgeon: Nelva Bush, MD;  Location: [facility];  Service: Cardiovascular;  Laterality: N/A;    Current Meds  Medication Sig   acetaminophen (TYLENOL) 325 MG tablet Take 1-2 tablets (325-650 mg total) by mouth every 4 (four) hours as needed for mild pain.   aspirin 81 MG chewable tablet Chew 1 tablet (81 mg total) by mouth daily.   atorvastatin (LIPITOR) 80 MG tablet Take 1 tablet (80 mg total) by mouth daily.   B Complex-C (SUPER B COMPLEX PO) Take 1 tablet by mouth daily. Unknown strength   docusate sodium (COLACE) 100 MG capsule Take 2 capsules (200 mg total) by mouth daily.   finasteride (PROSCAR) 5 MG tablet Take 5 mg by mouth daily.   hydrALAZINE (APRESOLINE) 10 MG tablet Take 1 tablet (10 mg total) by mouth 3 (three) times daily.   hydrALAZINE (APRESOLINE) 25 MG tablet Take 25 mg by mouth 3 (three) times daily.   isosorbide dinitrate (ISORDIL) 30 MG tablet Take 1 tablet (30 mg total) by mouth 2 (two) times daily.   melatonin 5 MG TABS Take 1 tablet (5 mg total) by mouth at bedtime.   metoprolol succinate (TOPROL-XL) 50 MG 24 hr tablet Take 1 tablet (50 mg total) by mouth daily. Take with or immediately following a meal.   Multiple Vitamin (MULTIVITAMIN WITH MINERALS) TABS tablet Take 1 tablet by mouth daily.   rosuvastatin (CRESTOR) 20 MG tablet Take 20 mg by mouth at bedtime.   senna-docusate (SENOKOT-S) 8.6-50 MG tablet Take 1 tablet by mouth at bedtime.   sodium bicarbonate 650 MG tablet Take 2 tablets (1,300 mg total) by mouth 3 (three) times daily.   tamsulosin (FLOMAX) 0.4 MG CAPS capsule Take 1 capsule (0.4 mg total) by mouth daily.   ticagrelor (BRILINTA) 90 MG TABS tablet Take 1 tablet (90 mg total) by mouth 2 (two) times daily.   traZODone (DESYREL) 50 MG tablet Take 1 tablet (50 mg total) by mouth  at bedtime.   valsartan (DIOVAN) 160 MG tablet Take 160 mg by mouth daily.    Allergies: Patient has no known allergies.  Social History   Tobacco Use   Smoking status: Former    Packs/day: 0.50    Years: 5.00    Pack years: 2.50    Types: Cigarettes    Quit date: 09/12/1968    Years since quitting: 52.5   Smokeless tobacco: Never  Vaping Use   Vaping Use: Never used  Substance Use Topics   Alcohol use: Yes    Comment: ONE PER WEEK   Drug use: Never    Family History  Problem Relation Age of Onset   Diabetes Mother    Brain cancer Sister    Melanoma Brother        Radiation Exposure   Leukemia Brother        Agent Orange    Review of Systems: A 12-system review of systems was  performed and was negative except as noted in the HPI.  --------------------------------------------------------------------------------------------------  Physical Exam: BP 110/70 (BP Location: Left Arm, Patient Position: Sitting, Cuff Size: Large)   Pulse 67   Ht 6\' 1"  (1.854 m)   Wt 227 lb (103 kg)   SpO2 97%   BMI 29.95 kg/m   General:  NAD. Neck: No JVD or HJR. Lungs: Clear to auscultation bilaterally without wheezes or crackles. Heart: Regular rate and rhythm without murmurs, rubs, or gallops. Abdomen: Soft, nontender, nondistended. Extremities: No lower extremity edema.  EKG: Normal sinus rhythm with PACs, first-degree AV block, left axis deviation, and left bundle branch block.  Compared with prior tracing on 01/26/2021, PVCs are no longer present.  Otherwise, no significant interval change.  Lab Results  Component Value Date   WBC 6.6 11/02/2020   HGB 9.0 (L) 11/02/2020   HCT 27.2 (L) 11/02/2020   MCV 94.1 11/02/2020   PLT 284 11/02/2020    Lab Results  Component Value Date   NA 141 12/28/2020   K 4.7 12/28/2020   CL 107 (H) 12/28/2020   CO2 17 (L) 12/28/2020   BUN 32 (H) 12/28/2020   CREATININE 2.07 (H) 12/28/2020   GLUCOSE 109 (H) 12/28/2020   ALT 50 (H)  10/30/2020    Lab Results  Component Value Date   CHOL 114 10/06/2020   HDL 39 (L) 10/06/2020   LDLCALC 42 10/06/2020   TRIG 163 (H) 10/06/2020   CHOLHDL 2.9 10/06/2020    --------------------------------------------------------------------------------------------------  ASSESSMENT AND PLAN: Coronary artery disease: Mr. Wilhoite does not have typical angina but has continued exertional dyspnea with mild activity.  Query if this represents his anginal equivalent or is predominantly due to his chronic HFrEF.  We have reviewed his catheterization films from January in depth, which showed multifocal proximal/mid LAD disease of up to 80%, which was not intervened upon at the time of his STEMI.  Treatment options include continued GDMT, noninvasive ischemia testing to assess ischemic burden, and repeat catheterization with plan for PCI to the LAD.  As Mr. Gaida is already on good antianginal and evidence-based heart failure therapy with continued symptoms and severely reduced LVEF, I think revascularization of the LAD would offer him the greatest potential for improvement in his cardiomyopathy and symptoms.  We discussed his elevated risk of contrast-induced nephropathy in the setting of chronic kidney disease.  We will plan for 4 hours of prehydration and overnight admission for gentle postprocedure hydration and monitoring in the setting of his severely reduced LVEF.  Mr. Maher will need to remain on dual antiplatelet therapy with aspirin and ticagrelor.  Chronic HFrEF and dyspnea on exertion: LVEF remains severely reduced (25-30% by echo last month).  Mr. Thone reports NYHA class III symptoms but appears euvolemic on examination today.  I will defer escalation of his evidence-based heart failure therapy today, which has been managed by [name].  As outlined above, we have agreed to pursue repeat catheterization with likely PCI to the LAD, as outlined above.  If his LVEF does not  improve in 3 months after PCI, CRT-D will need to be considered.  Chronic kidney disease: Creatinine relatively stable on most recent follow-up with nephrology.  We will continue current GDMT including valsartan.  In anticipation of upcoming catheterization, we will repeat BMP a few days before his catheterization with plans for gentle rehydration x4 hours.  Other nephrotoxic agents should be avoided.  Hyperlipidemia: Continue atorvastatin 80 mg daily.  Urinary retention: Continue indwelling Foley.  I would favor deferring nonemergent urologic procedures until after revascularization of the LAD and hopefully improvement in Mr. Gariepy LVEF.  If possible, he should continue with dual antiplatelet therapy through 09/2021.  Shared Decision Making/Informed Consent The risks [stroke (1 in 1000), death (1 in 1000), kidney failure [usually temporary] (1 in 500), bleeding (1 in 200), allergic reaction [possibly serious] (1 in 200)], benefits (diagnostic support and management of coronary artery disease) and alternatives of a cardiac catheterization were discussed in detail with Mr. Camille and he is willing to proceed.  Follow-up: To be determined based on cath/PCI.  Approximately 45 minutes were spent on this encounter, of which more than half was spent counseling the patient and his wife face-to-face.  Nelva Bush, MD 03/31/2021 9:27 AM

## 2021-03-30 ENCOUNTER — Other Ambulatory Visit: Payer: Self-pay

## 2021-03-31 ENCOUNTER — Encounter: Payer: Self-pay | Admitting: Internal Medicine

## 2021-03-31 ENCOUNTER — Other Ambulatory Visit: Payer: Self-pay

## 2021-03-31 ENCOUNTER — Ambulatory Visit (INDEPENDENT_AMBULATORY_CARE_PROVIDER_SITE_OTHER): Payer: Medicare Other | Admitting: Cardiology

## 2021-03-31 ENCOUNTER — Encounter: Payer: Self-pay | Admitting: Cardiology

## 2021-03-31 ENCOUNTER — Ambulatory Visit (INDEPENDENT_AMBULATORY_CARE_PROVIDER_SITE_OTHER): Payer: Medicare Other | Admitting: Internal Medicine

## 2021-03-31 VITALS — BP 110/70 | HR 67 | Ht 73.0 in | Wt 227.0 lb

## 2021-03-31 VITALS — HR 72 | Ht 73.0 in | Wt 228.1 lb

## 2021-03-31 DIAGNOSIS — E785 Hyperlipidemia, unspecified: Secondary | ICD-10-CM | POA: Diagnosis not present

## 2021-03-31 DIAGNOSIS — N1832 Chronic kidney disease, stage 3b: Secondary | ICD-10-CM

## 2021-03-31 DIAGNOSIS — I469 Cardiac arrest, cause unspecified: Secondary | ICD-10-CM | POA: Diagnosis not present

## 2021-03-31 DIAGNOSIS — I712 Thoracic aortic aneurysm, without rupture: Secondary | ICD-10-CM | POA: Diagnosis not present

## 2021-03-31 DIAGNOSIS — I4901 Ventricular fibrillation: Secondary | ICD-10-CM

## 2021-03-31 DIAGNOSIS — I7121 Aneurysm of the ascending aorta, without rupture: Secondary | ICD-10-CM

## 2021-03-31 DIAGNOSIS — I447 Left bundle-branch block, unspecified: Secondary | ICD-10-CM

## 2021-03-31 DIAGNOSIS — I10 Essential (primary) hypertension: Secondary | ICD-10-CM

## 2021-03-31 DIAGNOSIS — I5022 Chronic systolic (congestive) heart failure: Secondary | ICD-10-CM

## 2021-03-31 DIAGNOSIS — I25118 Atherosclerotic heart disease of native coronary artery with other forms of angina pectoris: Secondary | ICD-10-CM

## 2021-03-31 DIAGNOSIS — R339 Retention of urine, unspecified: Secondary | ICD-10-CM

## 2021-03-31 DIAGNOSIS — Z86711 Personal history of pulmonary embolism: Secondary | ICD-10-CM | POA: Diagnosis not present

## 2021-03-31 DIAGNOSIS — I2510 Atherosclerotic heart disease of native coronary artery without angina pectoris: Secondary | ICD-10-CM

## 2021-03-31 DIAGNOSIS — R0600 Dyspnea, unspecified: Secondary | ICD-10-CM

## 2021-03-31 DIAGNOSIS — R0609 Other forms of dyspnea: Secondary | ICD-10-CM

## 2021-03-31 NOTE — Patient Instructions (Addendum)
Medication Instructions:   Your physician recommends that you continue on your current medications as directed. Please refer to the Current Medication list given to you today.  *If you need a refill on your cardiac medications before your next appointment, please call your pharmacy*   Lab Work:  You will need to have blood drawn on Thursday, July 28 at the [facility]). Please arrive at 10:45 AM for BMET / CBC. You do not need to be fasting for these labs.    Testing/Procedures:  You are scheduled for a Cardiac Catheterization on Monday, August 1 with Dr. Harrell Gave End.  1. Please arrive at the [facility] (Main Entrance A) at [facility]: [place],  [address] at 5:30 AM (This time is four hours before your procedure to ensure your preparation). Free valet parking service is available.   Special note: Every effort is made to have your procedure done on time. Please understand that emergencies sometimes delay scheduled procedures.  2. Diet: Do not eat solid foods after midnight.  The patient may have clear liquids until 5am upon the day of the procedure.  3. Labs: You will need to have blood drawn on Thursday, July 28 at the [facility]). Please arrive at 10:45 AM for BMET / CBC. You do not need to be fasting for these labs.    4. Medication instructions in preparation for your procedure:   Contrast Allergy: No   On the morning of your procedure, take your Aspirin and Brilinta/Ticagrelor and any morning medicines NOT listed above.  You may use sips of water.  5. Plan for one night stay--bring personal belongings. 6. Bring a current list of your medications and current insurance cards. 7. You MUST have a responsible person to drive you home. 8. Someone MUST be with you the first 24 hours after you arrive home or your discharge will be delayed. 9.  Please wear clothes that are easy to get on and off and wear slip-on shoes.  Thank you for allowing [place] to care for you!   -- [facility]    Follow-Up: At [facility], you and your health needs are our priority.  As part of our continuing mission to provide you with exceptional heart care, we have created designated Provider Care Teams.  These Care Teams include your primary Cardiologist (physician) and Advanced Practice Providers (APPs -  Physician Assistants and Nurse Practitioners) who all work together to provide you with the care you need, when you need it.  We recommend signing up for the patient portal called "MyChart".  Sign up information is provided on this After Visit Summary.  MyChart is used to connect with patients for Virtual Visits (Telemedicine).  Patients are able to view lab/test results, encounter notes, upcoming appointments, etc.  Non-urgent messages can be sent to your provider as well.   To learn more about what you can do with MyChart, go to [email or web address].    Your next appointment:    Follow up after cath procedure  The format for your next appointment:   In Person  Provider:   You may see Dr. Harrell Gave End or one of the following Advanced Practice Providers on your designated Care Team:   Murray Hodgkins, NP Christell Faith, PA-C Marrianne Mood, PA-C Cadence [place], [place]

## 2021-03-31 NOTE — Progress Notes (Signed)
Cardiology Office Note:    Date:  03/31/2021   ID:  Timothy Moran, DOB 24-Jul-1946, MRN [id number]  PCP:  Cari Caraway, MD  Cardiologist:  Jenne Campus, MD    Referring MD: Cari Caraway, MD   No chief complaint on file.   History of Present Illness:    Timothy Moran is a 75 y.o. male   with incredibly complex past medical history.  Initially he was seen in my office because of history of pulmonary emboli he did have mild pulmonary hypertension but overall was doing quite well.  However at the end of January he was shoveling the snow started having chest pain 1 back and collapse he was found to be in V. fib he was defibrillated brought to [facility] he was found to have acute STEMI involving circumflex artery that was addressed with PTCA and drug-eluting stent.  He does have residual 80% stenosis of the mid LAD he ended up having diminished ejection fraction with level of 25%.  He did wear LifeVest however absolutely hated this and does not want to wear it anymore.  He comes today to my office complaining of having some fatigue tiredness as well as dizziness" upon getting up.   Is coming today to my office for follow-up earlier in the morning he did see Dr. Saunders Revel.  Decision has been made to pursue intervention on his mid left anterior descending artery which is significantly narrow I think this is the right decision because in spite of appropriate medical therapy that he is able to tolerate we are unable to improve left ventricle ejection fraction.  Risk of intervention are high secondary to the fact that he does have some significant kidney dysfunction but if he will be careful it is reasonable approach.  Overall he complained of being tired exhausted he does have some shortness of breath no dizziness no passing out only when he gets up quickly he will get somewhat dizzy.  Denies having the palpitations  Past Medical History:  Diagnosis Date   Abnormal echocardiogram 06/07/2019    Acute blood loss anemia 10/05/2020   Acute on chronic combined systolic and diastolic HF (heart failure) ([place]) 10/26/2020   Acute respiratory failure ([place]), hypothermia therapy, vent - extubated 10/06/20    AKI (acute kidney injury) ([place]) 10/05/2020   Anoxic brain injury ([place]) 10/26/2020   Arrhythmia    Ascending aortic aneurysm (HCC) 4.2 cm based on CT from December 2020 01/29/2018   4.3 cm by echo in 2019   Benign brain tumor ([place])    Benign neoplasm of brain ([place]) 12/19/2017   CAD in native artery residual disease of LAD, may need PCI, treating medically for now 10/26/2020   Cardiac arrest with ventricular fibrillation (HCC)    Chest pain 12/04/2017   Chronic kidney disease    Stage 3   Chronic kidney disease, stage III (moderate) ([place]) 12/19/2017   Diabetes mellitus, type 2 ([place]) 10/06/2020   10/06/20 A1C 7%   Dizziness 10/11/2019   DM (diabetes mellitus), type 2 ([place]) new 10/26/2020   Encounter for central line placement    Encounter for imaging study to confirm orogastric (OG) tube placement    Hematuria 10/26/2020   History of pulmonary embolus ([place])    HLD (hyperlipidemia) 10/26/2020   Hypertension    Iron deficiency anemia rec'd IV iron 10/26/2020   Ischemic cardiomyopathy 10/26/2020   Left bundle branch block 12/19/2017   Meningioma ([place]) 08/14/2020   Nonsustained ventricular tachycardia ([place]) 10/11/2019   Pain  of left hip joint 05/29/2020   Personal history of pulmonary embolism 12/19/2017   Pneumonia of both lungs due to infectious organism    Pulmonary hypertension due to thromboembolism ([place]) 03/09/2018   See echo 12/27/17 with nl PAS vs CTa chest 02/23/18    S/P angioplasty with stent 10/04/20 DES to LCX  10/26/2020   Sinus bradycardia 12/22/2017   SOB (shortness of breath)    Solitary pulmonary nodule on lung CT 03/08/2018   CT 12/04/17 1.0 x 0.8 x 0.7 cm nodular opacity in the RUL vs not seen 03/16/10 posterior segment of the right upper lobe. [place] KitchenSpirometry 03/08/2018    FEV1 3.86 (108%)  Ratio 96 s  prior rx  - PET  03/13/18   Low grade c/w adenoca > rec T surgery eval     STEMI (ST elevation myocardial infarction) ([place]) 10/04/2020   Urinary retention 10/26/2020   Vestibular schwannoma ([place]) 08/14/2020    Past Surgical History:  Procedure Laterality Date   APPENDECTOMY     CARDIAC CATHETERIZATION     CORONARY/GRAFT ACUTE MI REVASCULARIZATION N/A 10/04/2020   Procedure: Coronary/Graft Acute MI Revascularization;  Surgeon: Nelva Bush, MD;  Location: [facility];  Service: Cardiovascular;  Laterality: N/A;   HERNIA REPAIR     LEFT HEART CATH AND CORONARY ANGIOGRAPHY N/A 10/04/2020   Procedure: LEFT HEART CATH AND CORONARY ANGIOGRAPHY;  Surgeon: Nelva Bush, MD;  Location: [facility];  Service: Cardiovascular;  Laterality: N/A;    Current Medications: Current Meds  Medication Sig   acetaminophen (TYLENOL) 325 MG tablet Take 1-2 tablets (325-650 mg total) by mouth every 4 (four) hours as needed for mild pain.   aspirin 81 MG chewable tablet Chew 1 tablet (81 mg total) by mouth daily.   atorvastatin (LIPITOR) 80 MG tablet Take 1 tablet (80 mg total) by mouth daily.   B Complex-C (SUPER B COMPLEX PO) Take 1 tablet by mouth daily. Unknown strength   docusate sodium (COLACE) 100 MG capsule Take 2 capsules (200 mg total) by mouth daily.   finasteride (PROSCAR) 5 MG tablet Take 5 mg by mouth daily.   hydrALAZINE (APRESOLINE) 25 MG tablet Take 25 mg by mouth 3 (three) times daily.   isosorbide dinitrate (ISORDIL) 30 MG tablet Take 1 tablet (30 mg total) by mouth 2 (two) times daily.   melatonin 5 MG TABS Take 1 tablet (5 mg total) by mouth at bedtime.   metoprolol succinate (TOPROL-XL) 50 MG 24 hr tablet Take 1 tablet (50 mg total) by mouth daily. Take with or immediately following a meal.   Multiple Vitamin (MULTIVITAMIN WITH MINERALS) TABS tablet Take 1 tablet by mouth daily. (Patient taking differently: Take 1 tablet by mouth daily. Unknown strenght)   nitroGLYCERIN  (NITROSTAT) 0.4 MG SL tablet Place 1 tablet (0.4 mg total) under the tongue every 5 (five) minutes as needed for chest pain.   rosuvastatin (CRESTOR) 20 MG tablet Take 20 mg by mouth at bedtime.   sodium bicarbonate 650 MG tablet Take 2 tablets (1,300 mg total) by mouth 3 (three) times daily.   ticagrelor (BRILINTA) 90 MG TABS tablet Take 1 tablet (90 mg total) by mouth 2 (two) times daily.   traZODone (DESYREL) 50 MG tablet Take 1 tablet (50 mg total) by mouth at bedtime.   valsartan (DIOVAN) 160 MG tablet Take 160 mg by mouth daily.     Allergies:   Patient has no known allergies.   Social History   Socioeconomic History  Marital status: Married    Spouse name: Elgie Maziarz    Number of children: 1   Years of education: Not on file   Highest education level: Not on file  Occupational History   Not on file  Tobacco Use   Smoking status: Former    Packs/day: 0.50    Years: 5.00    Pack years: 2.50    Types: Cigarettes    Quit date: 09/12/1968    Years since quitting: 52.5   Smokeless tobacco: Never  Vaping Use   Vaping Use: Never used  Substance and Sexual Activity   Alcohol use: Yes    Comment: ONE PER WEEK   Drug use: Never   Sexual activity: Not on file  Other Topics Concern   Not on file  Social History Narrative   Not on file   Social Determinants of Health   Financial Resource Strain: Not on file  Food Insecurity: Not on file  Transportation Needs: Not on file  Physical Activity: Not on file  Stress: Not on file  Social Connections: Not on file     Family History: The patient's family history includes Brain cancer in his sister; Diabetes in his mother; Leukemia in his brother; Melanoma in his brother. ROS:   Please see the history of present illness.    All 14 point review of systems negative except as described per history of present illness  EKGs/Labs/Other Studies Reviewed:      Recent Labs: 10/06/2020: TSH 3.917 10/08/2020: B Natriuretic  Peptide 1,434.0 10/12/2020: Magnesium 2.2 10/30/2020: ALT 50 11/02/2020: Hemoglobin 9.0; Platelets 284 12/28/2020: BUN 32; Creatinine, Ser 2.07; NT-Pro BNP 2,733; Potassium 4.7; Sodium 141  Recent Lipid Panel    Component Value Date/Time   CHOL 114 10/06/2020 0317   CHOL 247 (H) 06/01/2020 0858   TRIG 163 (H) 10/06/2020 0317   HDL 39 (L) 10/06/2020 0317   HDL 33 (L) 06/01/2020 0858   CHOLHDL 2.9 10/06/2020 0317   VLDL 33 10/06/2020 0317   LDLCALC 42 10/06/2020 0317   LDLCALC 151 (H) 06/01/2020 0858    Physical Exam:    VS:  BP (P) 110/64 (BP Location: Left Arm, Patient Position: Sitting)   Pulse 72   Ht 6\' 1"  (1.854 m)   Wt 228 lb 1.3 oz (103.5 kg)   SpO2 98%   BMI 30.09 kg/m     Wt Readings from Last 3 Encounters:  03/31/21 228 lb 1.3 oz (103.5 kg)  03/31/21 227 lb (103 kg)  01/26/21 224 lb (101.6 kg)     GEN:  Well nourished, well developed in no acute distress HEENT: Normal NECK: No JVD; No carotid bruits LYMPHATICS: No lymphadenopathy CARDIAC: RRR, no murmurs, no rubs, no gallops RESPIRATORY:  Clear to auscultation without rales, wheezing or rhonchi  ABDOMEN: Soft, non-tender, non-distended MUSCULOSKELETAL:  No edema; No deformity  SKIN: Warm and dry LOWER EXTREMITIES: no swelling NEUROLOGIC:  Alert and oriented x 3 PSYCHIATRIC:  Normal affect   ASSESSMENT:    1. CAD in native artery residual disease of LAD, may need PCI, treating medically for now   2. Cardiac arrest with ventricular fibrillation ([place])   3. Primary hypertension   4. Ascending aortic aneurysm (HCC) 4.2 cm based on CT from December 2020   5. Left bundle branch block   6. Personal history of pulmonary embolism    PLAN:    In order of problems listed above:  Coronary artery disease: Plan is to intervene of his left anterior  descending artery.  I did review procedure with him risks again were stressed including risk of kidney dysfunction.  Hopefully will be careful and will be able to use very  little diet and that will prevent [place] from having some kidney injury.  He also need to be hydrated before that History of cardiac arrest.  That was in face of acute myocardial infarction.  No long-term risk of it Cardiomyopathy with significantly diminished ejection fraction he is on all medication that he can tolerate the problem is blood pressure being low as well as kidney dysfunction.  Plan is to intervene on his coronary arteries hope is that that will improve his left ventricle ejection fraction if not then we need to consider ICD. Left bundle branch block.  Noted, in the future may require BiV pacing   Medication Adjustments/Labs and Tests Ordered: Current medicines are reviewed at length with the patient today.  Concerns regarding medicines are outlined above.  No orders of the defined types were placed in this encounter.  Medication changes: No orders of the defined types were placed in this encounter.   Signed, Park Liter, MD, [facility] 03/31/2021 3:04 PM    [facility]

## 2021-03-31 NOTE — Patient Instructions (Signed)

## 2021-04-02 ENCOUNTER — Other Ambulatory Visit: Payer: Self-pay | Admitting: Internal Medicine

## 2021-04-02 DIAGNOSIS — I5022 Chronic systolic (congestive) heart failure: Secondary | ICD-10-CM

## 2021-04-02 DIAGNOSIS — I25118 Atherosclerotic heart disease of native coronary artery with other forms of angina pectoris: Secondary | ICD-10-CM

## 2021-04-06 ENCOUNTER — Telehealth: Payer: Self-pay | Admitting: Cardiology

## 2021-04-06 ENCOUNTER — Telehealth: Payer: Self-pay | Admitting: Internal Medicine

## 2021-04-06 NOTE — Telephone Encounter (Signed)
Spoke to the patient just now and let him know Dr. Wendy Poet recommendations. He verbalizes understanding and thanks me for the call back.

## 2021-04-06 NOTE — Telephone Encounter (Signed)
Spoke to pt, reviewed cath instructions again with pt. Pt does also have written instructions as well.  Pt verbalized understanding that he will:   1. Please arrive at the [facility] (Main Entrance A) at [facility]: [address] [place], [place] [address] at 5:30 AM (This time is four hours before your procedure to ensure your preparation). Free valet parking service is available.  Also reminded pt of lab appt: You will need to have blood drawn on Thursday, July 28 at the [facility]). Please arrive at 10:45 AM for BMET / CBC. You do not need to be fasting for these labs.   Pt has no further questions.

## 2021-04-06 NOTE — Telephone Encounter (Signed)
Patient calling back to go over arrival time location for procedure .

## 2021-04-06 NOTE — Telephone Encounter (Signed)
Pt c/o medication issue:  1. Name of Medication: atorvastatin (LIPITOR) 80 MG tablet rosuvastatin (CRESTOR) 20 MG tablet  2. How are you currently taking this medication (dosage and times per day)? As directed   3. Are you having a reaction (difficulty breathing--STAT)? No   4. What is your medication issue? Patient was preparing to have a Stent done Monday 04/12/21. The Patient was going over his medication lit and was told that these are both statins. He is not sure that he is supposed to be taking both.   Please let the patient know if he should be taking both medications

## 2021-04-08 ENCOUNTER — Other Ambulatory Visit: Payer: Medicare Other | Admitting: *Deleted

## 2021-04-08 ENCOUNTER — Other Ambulatory Visit: Payer: Self-pay

## 2021-04-08 DIAGNOSIS — R0609 Other forms of dyspnea: Secondary | ICD-10-CM

## 2021-04-08 DIAGNOSIS — R0600 Dyspnea, unspecified: Secondary | ICD-10-CM

## 2021-04-08 DIAGNOSIS — I25118 Atherosclerotic heart disease of native coronary artery with other forms of angina pectoris: Secondary | ICD-10-CM | POA: Diagnosis not present

## 2021-04-08 LAB — BASIC METABOLIC PANEL
BUN/Creatinine Ratio: 13 (ref 10–24)
BUN: 29 mg/dL — ABNORMAL HIGH (ref 8–27)
CO2: 17 mmol/L — ABNORMAL LOW (ref 20–29)
Calcium: 9.3 mg/dL (ref 8.6–10.2)
Chloride: 107 mmol/L — ABNORMAL HIGH (ref 96–106)
Creatinine, Ser: 2.3 mg/dL — ABNORMAL HIGH (ref 0.76–1.27)
Glucose: 98 mg/dL (ref 65–99)
Potassium: 4.4 mmol/L (ref 3.5–5.2)
Sodium: 139 mmol/L (ref 134–144)
eGFR: 29 mL/min/{1.73_m2} — ABNORMAL LOW (ref >59)

## 2021-04-08 LAB — CBC
Hematocrit: 33.4 % — ABNORMAL LOW (ref 37.5–51.0)
Hemoglobin: 11.4 g/dL — ABNORMAL LOW (ref 13.0–17.7)
MCH: 30.9 pg (ref 26.6–33.0)
MCHC: 34.1 g/dL (ref 31.5–35.7)
MCV: 91 fL (ref 79–97)
Platelets: 275 10*3/uL (ref 150–450)
RBC: 3.69 x10E6/uL — ABNORMAL LOW (ref 4.14–5.80)
RDW: 13.1 % (ref 11.6–15.4)
WBC: 8.9 10*3/uL (ref 3.4–10.8)

## 2021-04-12 ENCOUNTER — Ambulatory Visit (HOSPITAL_COMMUNITY)
Admission: RE | Admit: 2021-04-12 | Discharge: 2021-04-13 | Disposition: A | Discharge status: Home / Self Care | Payer: Medicare Other | Attending: Internal Medicine | Admitting: Internal Medicine

## 2021-04-12 ENCOUNTER — Other Ambulatory Visit: Payer: Self-pay

## 2021-04-12 ENCOUNTER — Encounter (HOSPITAL_COMMUNITY): Payer: Self-pay | Admitting: Internal Medicine

## 2021-04-12 ENCOUNTER — Ambulatory Visit (HOSPITAL_COMMUNITY)
Admission: RE | Disposition: A | Discharge status: Home / Self Care | Payer: Self-pay | Source: Home / Self Care | Attending: Internal Medicine

## 2021-04-12 DIAGNOSIS — I5022 Chronic systolic (congestive) heart failure: Secondary | ICD-10-CM

## 2021-04-12 DIAGNOSIS — Z79899 Other long term (current) drug therapy: Secondary | ICD-10-CM | POA: Diagnosis not present

## 2021-04-12 DIAGNOSIS — I255 Ischemic cardiomyopathy: Secondary | ICD-10-CM | POA: Insufficient documentation

## 2021-04-12 DIAGNOSIS — Z87891 Personal history of nicotine dependence: Secondary | ICD-10-CM | POA: Insufficient documentation

## 2021-04-12 DIAGNOSIS — I25118 Atherosclerotic heart disease of native coronary artery with other forms of angina pectoris: Secondary | ICD-10-CM | POA: Diagnosis not present

## 2021-04-12 DIAGNOSIS — Z8674 Personal history of sudden cardiac arrest: Secondary | ICD-10-CM | POA: Diagnosis not present

## 2021-04-12 DIAGNOSIS — I10 Essential (primary) hypertension: Secondary | ICD-10-CM | POA: Diagnosis present

## 2021-04-12 DIAGNOSIS — Z955 Presence of coronary angioplasty implant and graft: Secondary | ICD-10-CM | POA: Diagnosis not present

## 2021-04-12 DIAGNOSIS — Z808 Family history of malignant neoplasm of other organs or systems: Secondary | ICD-10-CM | POA: Insufficient documentation

## 2021-04-12 DIAGNOSIS — Z806 Family history of leukemia: Secondary | ICD-10-CM | POA: Diagnosis not present

## 2021-04-12 DIAGNOSIS — I5042 Chronic combined systolic (congestive) and diastolic (congestive) heart failure: Secondary | ICD-10-CM | POA: Insufficient documentation

## 2021-04-12 DIAGNOSIS — I130 Hypertensive heart and chronic kidney disease with heart failure and stage 1 through stage 4 chronic kidney disease, or unspecified chronic kidney disease: Secondary | ICD-10-CM | POA: Diagnosis not present

## 2021-04-12 DIAGNOSIS — I272 Pulmonary hypertension, unspecified: Secondary | ICD-10-CM | POA: Diagnosis not present

## 2021-04-12 DIAGNOSIS — Z86018 Personal history of other benign neoplasm: Secondary | ICD-10-CM | POA: Diagnosis not present

## 2021-04-12 DIAGNOSIS — Z7982 Long term (current) use of aspirin: Secondary | ICD-10-CM | POA: Diagnosis not present

## 2021-04-12 DIAGNOSIS — N183 Chronic kidney disease, stage 3 unspecified: Secondary | ICD-10-CM | POA: Diagnosis not present

## 2021-04-12 DIAGNOSIS — I2510 Atherosclerotic heart disease of native coronary artery without angina pectoris: Secondary | ICD-10-CM | POA: Insufficient documentation

## 2021-04-12 DIAGNOSIS — I2584 Coronary atherosclerosis due to calcified coronary lesion: Secondary | ICD-10-CM | POA: Diagnosis not present

## 2021-04-12 DIAGNOSIS — Z86711 Personal history of pulmonary embolism: Secondary | ICD-10-CM | POA: Diagnosis not present

## 2021-04-12 DIAGNOSIS — I252 Old myocardial infarction: Secondary | ICD-10-CM | POA: Insufficient documentation

## 2021-04-12 DIAGNOSIS — E1122 Type 2 diabetes mellitus with diabetic chronic kidney disease: Secondary | ICD-10-CM | POA: Insufficient documentation

## 2021-04-12 DIAGNOSIS — Z833 Family history of diabetes mellitus: Secondary | ICD-10-CM | POA: Insufficient documentation

## 2021-04-12 DIAGNOSIS — E785 Hyperlipidemia, unspecified: Secondary | ICD-10-CM | POA: Insufficient documentation

## 2021-04-12 DIAGNOSIS — R338 Other retention of urine: Secondary | ICD-10-CM | POA: Insufficient documentation

## 2021-04-12 HISTORY — PX: INTRAVASCULAR ULTRASOUND/IVUS: CATH118244

## 2021-04-12 HISTORY — PX: CORONARY ULTRASOUND/IVUS: CATH118244

## 2021-04-12 HISTORY — PX: CORONARY STENT INTERVENTION: CATH118234

## 2021-04-12 HISTORY — PX: CORONARY PRESSURE/FFR STUDY: CATH118243

## 2021-04-12 HISTORY — PX: RIGHT/LEFT HEART CATH AND CORONARY ANGIOGRAPHY: CATH118266

## 2021-04-12 HISTORY — PX: CORONARY ANGIOPLASTY WITH STENT PLACEMENT: SHX49

## 2021-04-12 HISTORY — PX: INTRAVASCULAR PRESSURE WIRE/FFR STUDY: CATH118243

## 2021-04-12 LAB — POCT ACTIVATED CLOTTING TIME
Activated Clotting Time: 225 seconds
Activated Clotting Time: 248 seconds
Activated Clotting Time: 260 seconds
Activated Clotting Time: 254 seconds
Activated Clotting Time: 271 seconds

## 2021-04-12 LAB — POCT I-STAT 7, (LYTES, BLD GAS, ICA,H+H)
Acid-base deficit: 5 mmol/L — ABNORMAL HIGH (ref 0.0–2.0)
Bicarbonate: 19.9 mmol/L — ABNORMAL LOW (ref 20.0–28.0)
Calcium, Ion: 1.29 mmol/L (ref 1.15–1.40)
HCT: 32 % — ABNORMAL LOW (ref 39.0–52.0)
Hemoglobin: 10.9 g/dL — ABNORMAL LOW (ref 13.0–17.0)
O2 Saturation: 98 %
Potassium: 4.3 mmol/L (ref 3.5–5.1)
Sodium: 144 mmol/L (ref 135–145)
TCO2: 21 mmol/L — ABNORMAL LOW (ref 22–32)
pCO2 arterial: 36 mmHg (ref 32.0–48.0)
pH, Arterial: 7.351 (ref 7.350–7.450)
pO2, Arterial: 102 mmHg (ref 83.0–108.0)

## 2021-04-12 LAB — POCT I-STAT EG7
Acid-base deficit: 5 mmol/L — ABNORMAL HIGH (ref 0.0–2.0)
Bicarbonate: 20.8 mmol/L (ref 20.0–28.0)
Calcium, Ion: 1.24 mmol/L (ref 1.15–1.40)
HCT: 31 % — ABNORMAL LOW (ref 39.0–52.0)
Hemoglobin: 10.5 g/dL — ABNORMAL LOW (ref 13.0–17.0)
O2 Saturation: 72 %
Potassium: 4.1 mmol/L (ref 3.5–5.1)
Sodium: 145 mmol/L (ref 135–145)
TCO2: 22 mmol/L (ref 22–32)
pCO2, Ven: 38.9 mmHg — ABNORMAL LOW (ref 44.0–60.0)
pH, Ven: 7.336 (ref 7.250–7.430)
pO2, Ven: 40 mmHg (ref 32.0–45.0)

## 2021-04-12 LAB — GLUCOSE, CAPILLARY: Glucose-Capillary: 112 mg/dL — ABNORMAL HIGH (ref 70–99)

## 2021-04-12 SURGERY — CORONARY STENT INTERVENTION
Anesthesia: LOCAL

## 2021-04-12 MED ORDER — LIDOCAINE HCL (PF) 1 % IJ SOLN
INTRAMUSCULAR | Status: DC | PRN
  Administered 2021-04-12 (×2): 2 mL

## 2021-04-12 MED ORDER — VERAPAMIL HCL 2.5 MG/ML IV SOLN
INTRAVENOUS | Status: AC
  Filled 2021-04-12: qty 2

## 2021-04-12 MED ORDER — FENTANYL CITRATE (PF) 100 MCG/2ML IJ SOLN
INTRAMUSCULAR | Status: DC | PRN
  Administered 2021-04-12: 25 ug via INTRAVENOUS

## 2021-04-12 MED ORDER — NITROGLYCERIN 1 MG/10 ML FOR IR/CATH LAB
INTRA_ARTERIAL | Status: AC
  Filled 2021-04-12: qty 10

## 2021-04-12 MED ORDER — HEPARIN (PORCINE) IN NACL 1000-0.9 UT/500ML-% IV SOLN
INTRAVENOUS | Status: AC
  Filled 2021-04-12: qty 1000

## 2021-04-12 MED ORDER — METOPROLOL SUCCINATE ER 25 MG PO TB24
50.0000 mg | ORAL_TABLET | Freq: Every day | ORAL | Status: DC
  Administered 2021-04-12 – 2021-04-13 (×2): 50 mg via ORAL
  Filled 2021-04-12 (×2): qty 2

## 2021-04-12 MED ORDER — ISOSORBIDE DINITRATE 30 MG PO TABS
30.0000 mg | ORAL_TABLET | Freq: Two times a day (BID) | ORAL | Status: DC
  Administered 2021-04-12 – 2021-04-13 (×3): 30 mg via ORAL
  Filled 2021-04-12 (×6): qty 1

## 2021-04-12 MED ORDER — NITROGLYCERIN 0.4 MG SL SUBL: 0.4000 mg | SUBLINGUAL_TABLET | SUBLINGUAL | Status: DC | PRN

## 2021-04-12 MED ORDER — FENTANYL CITRATE (PF) 100 MCG/2ML IJ SOLN
INTRAMUSCULAR | Status: AC
  Filled 2021-04-12: qty 2

## 2021-04-12 MED ORDER — ASPIRIN 81 MG PO CHEW: 81.0000 mg | CHEWABLE_TABLET | ORAL | Status: DC

## 2021-04-12 MED ORDER — LIDOCAINE HCL (PF) 1 % IJ SOLN
INTRAMUSCULAR | Status: AC
  Filled 2021-04-12: qty 30

## 2021-04-12 MED ORDER — SODIUM CHLORIDE 0.9% FLUSH: 3.0000 mL | INTRAVENOUS | Status: DC | PRN

## 2021-04-12 MED ORDER — NITROGLYCERIN 1 MG/10 ML FOR IR/CATH LAB
INTRA_ARTERIAL | Status: DC | PRN
  Administered 2021-04-12 (×3): 200 ug via INTRACORONARY

## 2021-04-12 MED ORDER — HYDRALAZINE HCL 10 MG PO TABS
10.0000 mg | ORAL_TABLET | Freq: Three times a day (TID) | ORAL | Status: DC
  Administered 2021-04-12 – 2021-04-13 (×3): 10 mg via ORAL
  Filled 2021-04-12 (×4): qty 1

## 2021-04-12 MED ORDER — IOHEXOL 350 MG/ML SOLN
INTRAVENOUS | Status: DC | PRN
  Administered 2021-04-12: 75 mL

## 2021-04-12 MED ORDER — SODIUM CHLORIDE 0.9 % IV SOLN: 250.0000 mL | INTRAVENOUS | Status: DC | PRN

## 2021-04-12 MED ORDER — ACETAMINOPHEN 325 MG PO TABS
650.0000 mg | ORAL_TABLET | ORAL | Status: DC | PRN
  Administered 2021-04-12: 650 mg via ORAL
  Filled 2021-04-12: qty 2

## 2021-04-12 MED ORDER — VERAPAMIL HCL 2.5 MG/ML IV SOLN
INTRAVENOUS | Status: DC | PRN
  Administered 2021-04-12: 10 mL via INTRA_ARTERIAL

## 2021-04-12 MED ORDER — SODIUM CHLORIDE 0.9% FLUSH: 3.0000 mL | Freq: Two times a day (BID) | INTRAVENOUS | Status: DC

## 2021-04-12 MED ORDER — ONDANSETRON HCL 4 MG/2ML IJ SOLN: 4.0000 mg | Freq: Four times a day (QID) | INTRAMUSCULAR | Status: DC | PRN

## 2021-04-12 MED ORDER — HYDRALAZINE HCL 20 MG/ML IJ SOLN: 10.0000 mg | INTRAMUSCULAR | Status: AC | PRN

## 2021-04-12 MED ORDER — ASPIRIN 81 MG PO CHEW
81.0000 mg | CHEWABLE_TABLET | Freq: Every day | ORAL | Status: DC
  Administered 2021-04-13: 81 mg via ORAL
  Filled 2021-04-12: qty 1

## 2021-04-12 MED ORDER — SODIUM CHLORIDE 0.9 % IV SOLN: INTRAVENOUS | Status: DC

## 2021-04-12 MED ORDER — MELATONIN 5 MG PO TABS
5.0000 mg | ORAL_TABLET | Freq: Every day | ORAL | Status: DC
  Administered 2021-04-12: 5 mg via ORAL
  Filled 2021-04-12: qty 1

## 2021-04-12 MED ORDER — HEPARIN SODIUM (PORCINE) 1000 UNIT/ML IJ SOLN
INTRAMUSCULAR | Status: AC
  Filled 2021-04-12: qty 1

## 2021-04-12 MED ORDER — MIDAZOLAM HCL 2 MG/2ML IJ SOLN
INTRAMUSCULAR | Status: DC | PRN
  Administered 2021-04-12: 1 mg via INTRAVENOUS

## 2021-04-12 MED ORDER — TICAGRELOR 90 MG PO TABS
90.0000 mg | ORAL_TABLET | Freq: Two times a day (BID) | ORAL | Status: DC
  Administered 2021-04-12 – 2021-04-13 (×2): 90 mg via ORAL
  Filled 2021-04-12 (×2): qty 1

## 2021-04-12 MED ORDER — HEPARIN SODIUM (PORCINE) 5000 UNIT/ML IJ SOLN
5000.0000 [IU] | Freq: Three times a day (TID) | INTRAMUSCULAR | Status: DC
  Administered 2021-04-12 – 2021-04-13 (×2): 5000 [IU] via SUBCUTANEOUS
  Filled 2021-04-12 (×2): qty 1

## 2021-04-12 MED ORDER — MIDAZOLAM HCL 2 MG/2ML IJ SOLN
INTRAMUSCULAR | Status: AC
  Filled 2021-04-12: qty 2

## 2021-04-12 MED ORDER — HEPARIN (PORCINE) IN NACL 1000-0.9 UT/500ML-% IV SOLN
INTRAVENOUS | Status: DC | PRN
  Administered 2021-04-12: 500 mL

## 2021-04-12 MED ORDER — HEPARIN SODIUM (PORCINE) 1000 UNIT/ML IJ SOLN
INTRAMUSCULAR | Status: DC | PRN
  Administered 2021-04-12: 4000 [IU] via INTRAVENOUS
  Administered 2021-04-12: 5000 [IU] via INTRAVENOUS
  Administered 2021-04-12: 3000 [IU] via INTRAVENOUS
  Administered 2021-04-12 (×2): 2000 [IU] via INTRAVENOUS
  Administered 2021-04-12: 3000 [IU] via INTRAVENOUS

## 2021-04-12 MED ORDER — TICAGRELOR 90 MG PO TABS
90.0000 mg | ORAL_TABLET | ORAL | Status: AC
  Administered 2021-04-12: 90 mg via ORAL
  Filled 2021-04-12: qty 1

## 2021-04-12 MED ORDER — SODIUM CHLORIDE 0.9 % IV SOLN: INTRAVENOUS | Status: AC

## 2021-04-12 MED ORDER — ATORVASTATIN CALCIUM 80 MG PO TABS
80.0000 mg | ORAL_TABLET | Freq: Every day | ORAL | Status: DC
  Administered 2021-04-12 – 2021-04-13 (×2): 80 mg via ORAL
  Filled 2021-04-12 (×2): qty 1

## 2021-04-12 MED ORDER — SODIUM CHLORIDE 0.9% FLUSH
3.0000 mL | Freq: Two times a day (BID) | INTRAVENOUS | Status: DC
  Administered 2021-04-12: 3 mL via INTRAVENOUS

## 2021-04-12 SURGICAL SUPPLY — 21 items
BALLN SAPPHIRE 2.5X15 (BALLOONS) ×2
BALLN SAPPHIRE ~~LOC~~ 3.0X18 (BALLOONS) ×2 IMPLANT
BALLOON SAPPHIRE 2.5X15 (BALLOONS) ×1 IMPLANT
CATH BALLN WEDGE 5F 110CM (CATHETERS) ×2 IMPLANT
CATH INFINITI JR4 5F (CATHETERS) ×2 IMPLANT
CATH LAUNCHER 6FR EBU 3.75 (CATHETERS) ×2 IMPLANT
CATH OPTICROSS HD (CATHETERS) ×2 IMPLANT
DEVICE RAD COMP TR BAND LRG (VASCULAR PRODUCTS) ×2 IMPLANT
ELECT DEFIB PAD ADLT CADENCE (PAD) ×2 IMPLANT
GLIDESHEATH SLEND SS 6F .021 (SHEATH) ×2 IMPLANT
GUIDEWIRE INQWIRE 1.5J.035X260 (WIRE) ×1 IMPLANT
GUIDEWIRE PRESSURE X 175 (WIRE) ×2 IMPLANT
INQWIRE 1.5J .035X260CM (WIRE) ×2
KIT ENCORE 26 ADVANTAGE (KITS) ×2 IMPLANT
KIT HEART LEFT (KITS) ×2 IMPLANT
PACK CARDIAC CATHETERIZATION (CUSTOM PROCEDURE TRAY) ×2 IMPLANT
SHEATH GLIDE SLENDER 4/5FR (SHEATH) ×2 IMPLANT
SLED PULL BACK IVUS (MISCELLANEOUS) ×2 IMPLANT
STENT ONYX FRONTIER 2.5X30 (Permanent Stent) ×2 IMPLANT
TRANSDUCER W/STOPCOCK (MISCELLANEOUS) ×2 IMPLANT
TUBING CIL FLEX 10 FLL-RA (TUBING) ×2 IMPLANT

## 2021-04-12 NOTE — Progress Notes (Signed)
Mobility Specialist: Progress Note   04/12/21 1748  Mobility  Activity Ambulated in hall  Level of Assistance Contact guard assist, steadying assist  Assistive Device  (IV Pole)  Distance Ambulated (ft) 470 ft  Mobility Ambulated with assistance in hallway  Mobility Response Tolerated well  Mobility performed by Mobility specialist  Bed Position Chair  $Mobility charge 1 Mobility   Pre-Mobility: 68 HR Post-Mobility: 71 HR, 96% SpO2  Pt unsteady during ambulation crossing his feet a few times during walk, RN notified. Pt asx throughout. Pt back to the chair after walk and is set-up with his dinner.   [facility] Yolando Gillum Mobility Specialist Mobility Specialist Phone: [phone]

## 2021-04-12 NOTE — Interval H&P Note (Signed)
History and Physical Interval Note:  04/12/2021 10:13 AM  Timothy Moran  has presented today for surgery, with the diagnosis of shortness of breath, congestive heart failure.  The various methods of treatment have been discussed with the patient and family. After consideration of risks (particularly worsening renal failure), benefits and other options for treatment, the patient has consented to  Procedure(s): CORONARY STENT INTERVENTION (N/A) as a surgical intervention.  Given that symptoms are most consistent with heart failure, we have also agreed to preform a right heart catheterization.  The patient's history has been reviewed, patient examined, no change in status, stable for surgery.  I have reviewed the patient's chart and labs.  Questions were answered to the patient's satisfaction.    Cath Lab Visit (complete for each Cath Lab visit)  Clinical Evaluation Leading to the Procedure:   ACS: No.  Non-ACS:    Anginal/Heart Failure Classification: CCS/NYHA III  Anti-ischemic medical therapy: Maximal Therapy (2 or more classes of medications)  Non-Invasive Test Results: No non-invasive testing performed (LVEF 25-30% -> high risk)  Prior CABG: No previous CABG  [place] Esch

## 2021-04-12 NOTE — Plan of Care (Signed)
  Problem: Cardiovascular: Goal: Vascular access site(s) Level 0-1 will be maintained Reactivated

## 2021-04-13 ENCOUNTER — Encounter (HOSPITAL_COMMUNITY): Payer: Self-pay | Admitting: Internal Medicine

## 2021-04-13 ENCOUNTER — Other Ambulatory Visit: Payer: Self-pay | Admitting: Home Health

## 2021-04-13 ENCOUNTER — Other Ambulatory Visit: Payer: Self-pay | Admitting: Physician Assistant

## 2021-04-13 DIAGNOSIS — Z87891 Personal history of nicotine dependence: Secondary | ICD-10-CM | POA: Diagnosis not present

## 2021-04-13 DIAGNOSIS — I5042 Chronic combined systolic (congestive) and diastolic (congestive) heart failure: Secondary | ICD-10-CM | POA: Diagnosis not present

## 2021-04-13 DIAGNOSIS — I255 Ischemic cardiomyopathy: Secondary | ICD-10-CM | POA: Diagnosis not present

## 2021-04-13 DIAGNOSIS — I2510 Atherosclerotic heart disease of native coronary artery without angina pectoris: Secondary | ICD-10-CM | POA: Diagnosis not present

## 2021-04-13 DIAGNOSIS — I25118 Atherosclerotic heart disease of native coronary artery with other forms of angina pectoris: Secondary | ICD-10-CM | POA: Diagnosis not present

## 2021-04-13 DIAGNOSIS — Z5189 Encounter for other specified aftercare: Secondary | ICD-10-CM

## 2021-04-13 DIAGNOSIS — I130 Hypertensive heart and chronic kidney disease with heart failure and stage 1 through stage 4 chronic kidney disease, or unspecified chronic kidney disease: Secondary | ICD-10-CM | POA: Diagnosis not present

## 2021-04-13 DIAGNOSIS — E785 Hyperlipidemia, unspecified: Secondary | ICD-10-CM | POA: Diagnosis not present

## 2021-04-13 DIAGNOSIS — I2584 Coronary atherosclerosis due to calcified coronary lesion: Secondary | ICD-10-CM | POA: Diagnosis not present

## 2021-04-13 DIAGNOSIS — Z955 Presence of coronary angioplasty implant and graft: Secondary | ICD-10-CM | POA: Diagnosis not present

## 2021-04-13 DIAGNOSIS — E1122 Type 2 diabetes mellitus with diabetic chronic kidney disease: Secondary | ICD-10-CM | POA: Diagnosis not present

## 2021-04-13 DIAGNOSIS — N183 Chronic kidney disease, stage 3 unspecified: Secondary | ICD-10-CM | POA: Diagnosis not present

## 2021-04-13 DIAGNOSIS — I252 Old myocardial infarction: Secondary | ICD-10-CM | POA: Diagnosis not present

## 2021-04-13 LAB — BASIC METABOLIC PANEL
Anion gap: 9 (ref 5–15)
BUN: 27 mg/dL — ABNORMAL HIGH (ref 8–23)
CO2: 19 mmol/L — ABNORMAL LOW (ref 22–32)
Calcium: 8.7 mg/dL — ABNORMAL LOW (ref 8.9–10.3)
Chloride: 112 mmol/L — ABNORMAL HIGH (ref 98–111)
Creatinine, Ser: 2.2 mg/dL — ABNORMAL HIGH (ref 0.61–1.24)
GFR, Estimated: 30 mL/min — ABNORMAL LOW (ref >60)
Glucose, Bld: 89 mg/dL (ref 70–99)
Potassium: 4.3 mmol/L (ref 3.5–5.1)
Sodium: 140 mmol/L (ref 135–145)

## 2021-04-13 LAB — LIPID PANEL
Cholesterol: 84 mg/dL (ref 0–200)
HDL: 26 mg/dL — ABNORMAL LOW (ref >40)
LDL Cholesterol: 18 mg/dL (ref 0–99)
Total CHOL/HDL Ratio: 3.2 RATIO
Triglycerides: 201 mg/dL — ABNORMAL HIGH (ref <150)
VLDL: 40 mg/dL (ref 0–40)

## 2021-04-13 LAB — CBC
HCT: 30.3 % — ABNORMAL LOW (ref 39.0–52.0)
Hemoglobin: 10 g/dL — ABNORMAL LOW (ref 13.0–17.0)
MCH: 31 pg (ref 26.0–34.0)
MCHC: 33 g/dL (ref 30.0–36.0)
MCV: 93.8 fL (ref 80.0–100.0)
Platelets: 242 10*3/uL (ref 150–400)
RBC: 3.23 MIL/uL — ABNORMAL LOW (ref 4.22–5.81)
RDW: 13.6 % (ref 11.5–15.5)
WBC: 9.8 10*3/uL (ref 4.0–10.5)
nRBC: 0 % (ref 0.0–0.2)

## 2021-04-13 MED ORDER — NITROGLYCERIN 0.4 MG SL SUBL: 0.4000 mg | SUBLINGUAL_TABLET | SUBLINGUAL | 1 refills | Status: DC | PRN

## 2021-04-13 MED ORDER — CHLORHEXIDINE GLUCONATE CLOTH 2 % EX PADS
6.0000 | MEDICATED_PAD | Freq: Every day | CUTANEOUS | Status: DC
  Administered 2021-04-13: 6 via TOPICAL

## 2021-04-13 NOTE — Progress Notes (Signed)
CARDIAC REHAB PHASE I   Offered to walk with pt. Pt states he is waiting on breakfast to be delivered, has been ambulating without difficulty. Pt and wife educated on importance of ASA and Brilinta. Pt given heart healthy and diabetic diets. Reviewed site care, restrictions, and exercise guidelines. Will refer to CRP II Hardesty Rufina Falco, RN BSN 04/13/2021 9:27 AM

## 2021-04-13 NOTE — Discharge Summary (Signed)
Discharge Summary    Patient ID: Timothy Moran MRN: [id number]; DOB: 08/16/1946  Admit date: 04/12/2021 Discharge date: 04/13/2021  PCP:  Cari Caraway, MD   [facility] Providers Cardiologist:  Jenne Campus, MD  Electrophysiologist:  Vickie Epley, MD  {     Discharge Diagnoses    Principal Problem:   Coronary artery disease of native artery of native heart with stable angina pectoris [facility]) Active Problems:   Chronic kidney disease, stage III (moderate) ([place])   Hypertension   Chronic HFrEF (heart failure with reduced ejection fraction) ([place])    Diagnostic Studies/Procedures    Coronary stent intervention 04/12/21:  Conclusions: Severe single-vessel coronary artery disease with multifocal disease of the proximal and mid LAD of up to 80% that is hemodynamically significant by RFR and IVUS criteria. Moderate LCx disease with 50% de novo stenosis proximal to stent placed in 09/2020.  There is also 40% in-stent restenosis in the LCx/OM2 stent.  These lesions are not hemodynamically significant (RFR = 0.98). Mildly elevated left heart, right heart, and pulmonary artery pressures. Normal Fick cardiac output/index. Successful IVUS- and RFR-guided PCI to 80% mid LAD stenosis using Onyx Frontier 2.5 x 30 mm drug-eluting stent (postdilated to 3.2 mm) with 0% residual stenosis and TIMI-3 flow.   Recommendations: Continue dual antiplatelet therapy with aspirin and ticagrelor for at least 12 months from the time of STEMI in 09/2020, ideally longer. Aggressive secondary prevention of CAD. Optimize goal-directed medical therapy for chronic HFrEF with normal Fick cardiac output.  I will hold valsartan today pending follow-up BMP in the setting of chronic kidney disease stage IV. Overnight extended recovery for gentle post catheterization hydration and repeat labs tomorrow. Diagnostic Dominance: Right    Intervention     _____________   History of Present Illness      Timothy Moran is a 75 y.o. male  with complex past medical history of CAD,  chronic combined HFrEF, PE, HTN, HLD, horacic aortic aneurysm, and meningioma, who presented to office on 03/31/21 for discussion of possible PCI to LAD. He suffered  inferior STEMI complicated by cardiac arrest s/p PCI to distal LCX with residual LAD disease in Jan 2022. He ws found to have diminished EF 25%, did wear LifeVest for sometime but absolutely hated this and stopped wearing it.That hospital course was complicated by AKI, his renal function eventually improved with creatinine hovering near 2. He was seen by [name] and Dr End, complaining fatigue, dizziness, exertional dyspnea with mild activity since he was discharged from the hospital.  He noted sporadic pain in the left side of his chest with sneezing or coughing but no exertional chest discomfort.  He has not had any orthopnea or edema.  He as an indwelling Foley catheter due to urinary tract obstruction related to his prostate.  He would like to undergo urologic intervention, though this has been deferred thus far due to ongoing DAPT as well as his underlying cardiac disease.   As he was already on good antianginal and evidence-based heart failure therapy with continued symptoms and severely reduced LVEF, decision was made to pursue revascularization of the LAD. He presented to the [facility] on 04/12/21 for elective PCI, given gentle IVF hydration peri-procedure and observed overnight post procedure.    Hospital Course     Consultants: N/A  CAD - hx of STEMI in Jan 2022, s/p PCI to distal Lcx with residual LAD disease; presented with continued fatigue, generalized weakness, exertional dyspnea since discharge; decision was made  to pursue revascularization of the LAD on 04/12/21 - PCI on 04/12/21 done by Dr End, severe single-vessel CAD with multifocal disease of the proximal and mid LAD of up to 80% that is hemodynamically significant by RFR and IVUS  criteria; Moderate LCx disease with 50% de novo stenosis proximal to stent placed in 09/2020.  There is also 40% in-stent restenosis in the LCx/OM2 stent.  These lesions are not hemodynamically significant (RFR = 0.98). Mildly elevated left heart, right heart, and pulmonary artery pressures; Normal Fick cardiac output/index; Successful IVUS- and RFR-guided PCI to 80% mid LAD stenosis using Onyx Frontier 2.5 x 30 mm drug-eluting stent (postdilated to 3.2 mm) with 0% residual stenosis and TIMI-3 flow. - recommend medical therapy with DAPT with ASA '81mg'$  and ticagrelor '90mg'$  BID for at least 12 month from 09/2020 (STEMI); continue metoprolol XL '50mg'$ , lipitor '80mg'$ , isosorbide '30mg'$ , Valsartan '160mg'$ , Nitro PRN.  - discussed post cath and wrist care - patient does not need any refill, will sent PRN nitro  - follow up has been arranged on 04/29/21 with cardiology  - repeat BMP on Friday 04/16/21 per Dr End request, script provided, patient prefers to get it at [place] office, no appt needed   Chronic combined CHF Ischemic Cardiomyopathy  - EF  25-30% from Echo 02/26/21 , unchanged from March Echo, slightly improved from Jan Echo  - He did not want wear life vest, consider AICD if no further improvement after above PCI - HE is euvolemic today on exam  - GDMT: continue on metoprolol XL '50mg'$ ,  valsartan '160mg'$ , hydralazine '10mg'$  TID, and isosorbide '30mg'$  BID. Historically limited drug therapy due to borderline low BP and CKD; will not add MRA and SGLT2I at this time, defer to follow up.   CKD stage III - renal index stable near baseline - gentle IVF hydration was given for cath - will resume ARB today, was held before procedure   HTN - BP 107/63 -118/75 over the past 24 hours, well controlled - continue metoprolol, valsartan, hydralazine, and  isosorbide as above   HLD - LDL 42 from 10/06/20, will add on lipid panel today, follow up with outpaient visit - continue Lipitor '80mg'$  daily   Urinary retention - follow up  with urology as scheduled    Did the patient have an acute coronary syndrome (MI, NSTEMI, STEMI, etc) this admission?:  No                               Did the patient have a percutaneous coronary intervention (stent / angioplasty)?:  Yes.     Cath/PCI Registry Performance & Quality Measures: Aspirin prescribed? - Yes ADP Receptor Inhibitor (Plavix/Clopidogrel, Brilinta/Ticagrelor or Effient/Prasugrel) prescribed (includes medically managed patients)? - Yes High Intensity Statin (Lipitor 40-'80mg'$  or Crestor 20-'40mg'$ ) prescribed? - Yes For EF <40%, was ACEI/ARB prescribed? - Yes For EF <40%, Aldosterone Antagonist (Spironolactone or Eplerenone) prescribed? - No - Reason:  BP low normal and CKD IIIb Cardiac Rehab Phase II ordered? - Yes      _____________  Discharge Vitals Blood pressure 127/65, pulse 69, temperature 98.4 F (36.9 C), temperature source Oral, resp. rate 14, height '6\' 1"'$  (1.854 m), weight 102.1 kg, SpO2 93 %.  Filed Weights   04/12/21 0546  Weight: 102.1 kg   Vitals:  Vitals:   04/13/21 0745 04/13/21 1030  BP:  127/65  Pulse: 71 69  Resp:    Temp: 98.4 F (36.9 C)  SpO2:  93%   General Appearance: In no apparent distress, sitting in bed HEENT: Normocephalic, atraumatic. EOMs intact. Wears glasses Neck: Supple, trachea midline, no JVDs Cardiovascular: Regular rate and rhythm, normal S1-S2,  no murmur/rub/gallop Respiratory: Resting breathing unlabored, lungs sounds clear to auscultation bilaterally, no use of accessory muscles. On room air.  No wheezes, rales or rhonchi.   Gastrointestinal: Bowel sounds positive, abdomen soft, non-tender, non-distended.  Extremities: Able to move all extremities in bed without difficulty, no edema/cyanosis/clubbing Genitourinary: genital exam not performed Musculoskeletal: Normal muscle bulk and tone, muscle strength 5/5 throughout Skin: Intact, warm, dry. No rashes or petechiae noted in exposed areas.  Neurologic: Alert,  oriented to person, place and time. Fluent speech, no cognitive deficit, no gross focal neuro deficit Psychiatric: Normal affect. Mood is appropriate.  Right wrist with dressing in place, small bruise noted, no hematoma or bleeding, strong grib, no neurovascular deficit     Labs & Radiologic Studies    CBC Recent Labs    04/12/21 1033 04/13/21 0317  WBC  --  9.8  HGB 10.9* 10.0*  HCT 32.0* 30.3*  MCV  --  93.8  PLT  --  [id number]   Basic Metabolic Panel Recent Labs    04/12/21 1033 04/13/21 0317  NA 144 140  K 4.3 4.3  CL  --  112*  CO2  --  19*  GLUCOSE  --  89  BUN  --  27*  CREATININE  --  2.20*  CALCIUM  --  8.7*   Liver Function Tests No results for input(s): AST, ALT, ALKPHOS, BILITOT, PROT, ALBUMIN in the last 72 hours. No results for input(s): LIPASE, AMYLASE in the last 72 hours. High Sensitivity Troponin:   No results for input(s): TROPONINIHS in the last 720 hours.  BNP Invalid input(s): POCBNP D-Dimer No results for input(s): DDIMER in the last 72 hours. Hemoglobin A1C No results for input(s): HGBA1C in the last 72 hours. Fasting Lipid Panel Recent Labs    04/13/21 0317  CHOL 84  HDL 26*  LDLCALC 18  TRIG 201*  CHOLHDL 3.2   Thyroid Function Tests No results for input(s): TSH, T4TOTAL, T3FREE, THYROIDAB in the last 72 hours.  Invalid input(s): FREET3 _____________  CARDIAC CATHETERIZATION  Result Date: 04/12/2021 Conclusions: Severe single-vessel coronary artery disease with multifocal disease of the proximal and mid LAD of up to 80% that is hemodynamically significant by RFR and IVUS criteria. Moderate LCx disease with 50% de novo stenosis proximal to stent placed in 09/2020.  There is also 40% in-stent restenosis in the LCx/OM2 stent.  These lesions are not hemodynamically significant (RFR = 0.98). Mildly elevated left heart, right heart, and pulmonary artery pressures. Normal Fick cardiac output/index. Successful IVUS- and RFR-guided PCI to 80% mid  LAD stenosis using Onyx Frontier 2.5 x 30 mm drug-eluting stent (postdilated to 3.2 mm) with 0% residual stenosis and TIMI-3 flow. Recommendations: Continue dual antiplatelet therapy with aspirin and ticagrelor for at least 12 months from the time of STEMI in 09/2020, ideally longer. Aggressive secondary prevention of CAD. Optimize goal-directed medical therapy for chronic HFrEF with normal Fick cardiac output.  I will hold valsartan today pending follow-up BMP in the setting of chronic kidney disease stage IV. Overnight extended recovery for gentle post catheterization hydration and repeat labs tomorrow. Nelva Bush, MD [facility]  Disposition   Patient states he is doing well. Denied any chest pain, SOB, dizziness. He states he is able to tolerate ambulation. He is agreeable  with medication therapy and to follow up on 04/29/21. All questions answered at bedside to satisfaction. Pt is being discharged home today in good condition.  Follow-up Plans & Appointments     Follow-up Information     Theora Gianotti, NP Follow up on 04/29/2021.   Specialties: Nurse Practitioner, Cardiology, Radiology Why: please arrive 15 min early to 9:15 AM for your post hospital cardiology follow up appointment Contact information: [place] [address] [phone]         [facility] Follow up on 04/16/2021.   Specialty: Cardiology Why: please arrive between 8-4 for your blood work, no fasting needed Contact information: [address] [place] [phone]               Discharge Instructions     Amb Referral to Cardiac Rehabilitation   Complete by: As directed    Diagnosis: Coronary Stents   After initial evaluation and assessments completed: Virtual Based Care may be provided alone or in conjunction with Phase 2 Cardiac Rehab based on patient barriers.: Yes   Diet - low sodium heart healthy    Complete by: As directed    Increase activity slowly   Complete by: As directed        Discharge Medications   Allergies as of 04/13/2021   No Known Allergies      Medication List     STOP taking these medications    acetaminophen 325 MG tablet Commonly known as: TYLENOL   docusate sodium 100 MG capsule Commonly known as: COLACE   senna-docusate 8.6-50 MG tablet Commonly known as: Senokot-S   sodium bicarbonate 650 MG tablet   traZODone 50 MG tablet Commonly known as: DESYREL       TAKE these medications    aspirin 81 MG chewable tablet Chew 1 tablet (81 mg total) by mouth daily.   atorvastatin 80 MG tablet Commonly known as: LIPITOR Take 1 tablet (80 mg total) by mouth daily.   hydrALAZINE 10 MG tablet Commonly known as: APRESOLINE Take 1 tablet (10 mg total) by mouth 3 (three) times daily.   isosorbide dinitrate 30 MG tablet Commonly known as: ISORDIL Take 1 tablet (30 mg total) by mouth 2 (two) times daily.   melatonin 5 MG Tabs Take 1 tablet (5 mg total) by mouth at bedtime.   metoprolol succinate 50 MG 24 hr tablet Commonly known as: TOPROL-XL Take 1 tablet (50 mg total) by mouth daily. Take with or immediately following a meal.   multivitamin with minerals Tabs tablet Take 1 tablet by mouth daily. What changed: additional instructions   nitroGLYCERIN 0.4 MG SL tablet Commonly known as: NITROSTAT Place 1 tablet (0.4 mg total) under the tongue every 5 (five) minutes as needed for chest pain.   SUPER B COMPLEX PO Take 1 tablet by mouth daily.   tamsulosin 0.4 MG Caps capsule Commonly known as: FLOMAX Take 1 capsule (0.4 mg total) by mouth daily.   ticagrelor 90 MG Tabs tablet Commonly known as: BRILINTA Take 1 tablet (90 mg total) by mouth 2 (two) times daily.   valsartan 160 MG tablet Commonly known as: DIOVAN Take 160 mg by mouth daily.           Outstanding Labs/Studies   BMP ordered for 04/16/21  Duration of Discharge  Encounter   Greater than 30 minutes including physician time.  Signed, Margie Billet, NP 04/13/2021, 11:42 AM   History and  all data above reviewed.  Patient examined.  I agree with the findings as above.  No pain.  No SOB. The patient exam reveals COR:RRR  ,  Lungs: Clear  ,  Abd: Positive bowel sounds, no rebound no guarding, Ext No edema, right radial site stable  .  All available labs, radiology testing, previous records reviewed. Agree with documented assessment and plan.   CAD:  Post elective PCI.  OK for discharge with plans as above.   Margie Billet  11:42 AM  04/13/2021

## 2021-04-13 NOTE — Progress Notes (Signed)
BMP ordered for Friday 04/16/21 per Dr End request, follow up at the upcoming office visit.

## 2021-04-13 NOTE — Discharge Instructions (Signed)
PLEASE REMEMBER TO BRING ALL OF YOUR MEDICATIONS TO EACH OF YOUR FOLLOW-UP OFFICE VISITS.  PLEASE ATTEND ALL SCHEDULED FOLLOW-UP APPOINTMENTS.   Activity: Increase activity slowly as tolerated. You may shower, but no soaking baths (or swimming) for 1 week. No driving for 24 hours. No lifting over 5 lbs for 1 week. No sexual activity for 1 week.   You May Return to Work: in 1 week (if applicable)  Wound Care: You may wash cath site gently with soap and water. Keep cath site clean and dry. If you notice pain, swelling, bleeding or pus at your cath site, please call [phone].  PLEASE DO NOT MISS ANY DOSES OF YOUR ASPIRIN/BRILINTA/!!!!!   Also keep a log of you blood pressures and bring back to your follow up appt. Please call the office with any questions.   Patients taking blood thinners should generally stay away from medicines like ibuprofen, Advil, Motrin, naproxen, and Aleve due to risk of stomach bleeding. You may take Tylenol as directed or talk to your primary doctor about alternatives.    PLEASE ENSURE THAT YOU DO NOT RUN OUT OF YOUR ASPIRIN/BRILINTA. This medication is very important to remain on for at least one year. IF you have issues obtaining this medication due to cost please CALL the office 3-5 business days prior to running out in order to prevent missing doses of this medication.    Radial Site Care  Refer to this sheet in the next few weeks. These instructions provide you with information on caring for yourself after your procedure. Your caregiver may also give you more specific instructions. Your treatment has been planned according to current medical practices, but problems sometimes occur. Call your caregiver if you have any problems or questions after your procedure.  HOME CARE INSTRUCTIONS You may shower the day after the procedure. Remove the bandage (dressing) and gently wash the site with plain soap and water. Gently pat the site dry.  Do not apply powder or  lotion to the site.  Do not submerge the affected site in water for 3 to 5 days.  Inspect the site at least twice daily.  Do not flex or bend the affected arm for 24 hours.  No lifting over 5 pounds (2.3 kg) for 5 days after your procedure.  Do not drive home if you are discharged the same day of the procedure. Have someone else drive you.  You may drive 24 hours after the procedure unless otherwise instructed by your caregiver.   What to expect: Any bruising will usually fade within 1 to 2 weeks.  Blood that collects in the tissue (hematoma) may be painful to the touch. It should usually decrease in size and tenderness within 1 to 2 weeks.   SEEK IMMEDIATE MEDICAL CARE IF: You have unusual pain at the radial site.  You have redness, warmth, swelling, or pain at the radial site.  You have drainage (other than a small amount of blood on the dressing).  You have chills.  You have a fever or persistent symptoms for more than 72 hours.  You have a fever and your symptoms suddenly get worse.  Your arm becomes pale, cool, tingly, or numb.  You have heavy bleeding from the site. Hold pressure on the site.

## 2021-04-14 ENCOUNTER — Encounter (HOSPITAL_COMMUNITY): Payer: Self-pay | Admitting: Internal Medicine

## 2021-04-16 ENCOUNTER — Other Ambulatory Visit: Payer: Self-pay

## 2021-04-16 DIAGNOSIS — Z5189 Encounter for other specified aftercare: Secondary | ICD-10-CM

## 2021-04-17 LAB — BASIC METABOLIC PANEL
BUN/Creatinine Ratio: 11 (ref 10–24)
BUN: 21 mg/dL (ref 8–27)
CO2: 17 mmol/L — ABNORMAL LOW (ref 20–29)
Calcium: 9.6 mg/dL (ref 8.6–10.2)
Chloride: 107 mmol/L — ABNORMAL HIGH (ref 96–106)
Creatinine, Ser: 1.98 mg/dL — ABNORMAL HIGH (ref 0.76–1.27)
Glucose: 104 mg/dL — ABNORMAL HIGH (ref 65–99)
Potassium: 4.8 mmol/L (ref 3.5–5.2)
Sodium: 142 mmol/L (ref 134–144)
eGFR: 34 mL/min/{1.73_m2} — ABNORMAL LOW (ref >59)

## 2021-04-19 ENCOUNTER — Telehealth (HOSPITAL_COMMUNITY): Payer: Self-pay

## 2021-04-19 MED FILL — Heparin Sod (Porcine)-NaCl IV Soln 1000 Unit/500ML-0.9%: INTRAVENOUS | Qty: 500 | Status: AC

## 2021-04-19 NOTE — Telephone Encounter (Signed)
Called patient to see if he is interested in the Cardiac Rehab Program. Patient expressed interest. Explained scheduling process and went over insurance, patient verbalized understanding. Will contact patient for scheduling once f/u has been completed.  °

## 2021-04-19 NOTE — Telephone Encounter (Signed)
Pt insurance is active and benefits verified through Medicare a/b Co-pay 0, DED $233/$233 met, out of pocket 0/0 met, co-insurance 20%. no pre-authorization required. Passport, 04/19/2021@3 :44pm, REF# [id number]  2ndary insurance is active and benefits verified through [place]. Co-pay 0, DED 0/0 met, out of pocket 0/0 met, co-insurance 0. No pre-authorization required. Passport, 04/20/2019@3 :46pm, REF# [id number]   Will contact patient to see if he is interested in the Cardiac Rehab Program. If interested, patient will need to complete follow up appt. Once completed, patient will be contacted for scheduling upon review by the RN Navigator.

## 2021-04-20 DIAGNOSIS — R338 Other retention of urine: Secondary | ICD-10-CM | POA: Diagnosis not present

## 2021-04-29 ENCOUNTER — Ambulatory Visit (INDEPENDENT_AMBULATORY_CARE_PROVIDER_SITE_OTHER): Payer: Medicare Other | Admitting: Nurse Practitioner

## 2021-04-29 ENCOUNTER — Encounter: Payer: Self-pay | Admitting: Nurse Practitioner

## 2021-04-29 ENCOUNTER — Other Ambulatory Visit: Payer: Self-pay

## 2021-04-29 ENCOUNTER — Telehealth: Payer: Self-pay | Admitting: Cardiology

## 2021-04-29 VITALS — BP 92/64 | HR 71 | Ht 73.5 in | Wt 226.5 lb

## 2021-04-29 DIAGNOSIS — I712 Thoracic aortic aneurysm, without rupture, unspecified: Secondary | ICD-10-CM

## 2021-04-29 DIAGNOSIS — I2510 Atherosclerotic heart disease of native coronary artery without angina pectoris: Secondary | ICD-10-CM | POA: Diagnosis not present

## 2021-04-29 DIAGNOSIS — I5022 Chronic systolic (congestive) heart failure: Secondary | ICD-10-CM | POA: Diagnosis not present

## 2021-04-29 DIAGNOSIS — N183 Chronic kidney disease, stage 3 unspecified: Secondary | ICD-10-CM | POA: Diagnosis not present

## 2021-04-29 DIAGNOSIS — I255 Ischemic cardiomyopathy: Secondary | ICD-10-CM | POA: Diagnosis not present

## 2021-04-29 DIAGNOSIS — E785 Hyperlipidemia, unspecified: Secondary | ICD-10-CM | POA: Diagnosis not present

## 2021-04-29 DIAGNOSIS — I10 Essential (primary) hypertension: Secondary | ICD-10-CM

## 2021-04-29 MED ORDER — METOPROLOL SUCCINATE ER 50 MG PO TB24: ORAL_TABLET | ORAL | 3 refills | Status: DC

## 2021-04-29 NOTE — Patient Instructions (Addendum)
Medication Instructions:  No changes at this time.  If not taking Metoprolol please notify us or Dr. Joycelyn Rua  *If you need a refill on your cardiac medications before your next appointment, please call your pharmacy*   Lab Work: None  If you have labs (blood work) drawn today and your tests are completely normal, you will receive your results only by: [place] (if you have MyChart) OR A paper copy in the mail If you have any lab test that is abnormal or we need to change your treatment, we will call you to review the results.   Testing/Procedures: None   Follow-Up: At [facility], you and your health needs are our priority.  As part of our continuing mission to provide you with exceptional heart care, we have created designated Provider Care Teams.  These Care Teams include your primary Cardiologist (physician) and Advanced Practice Providers (APPs -  Physician Assistants and Nurse Practitioners) who all work together to provide you with the care you need, when you need it.   Your next appointment:   6-8 week(s) in [facility] office  The format for your next appointment:   In Person  Provider:   Jenne Campus, MD

## 2021-04-29 NOTE — Telephone Encounter (Signed)
Refill sent in per request.  

## 2021-04-29 NOTE — Progress Notes (Signed)
Office Visit    Patient Name: Timothy Moran Date of Encounter: 04/29/2021  Primary Care Provider:  Cari Caraway, MD Primary Cardiologist:  Jenne Campus, MD  Chief Complaint    75 year old male with a history of CAD status post inferior STEMI complicated by cardiac arrest and PCI to the distal circumflex in January 2022, HFrEF, ischemic cardiomyopathy, PE x2, hypertension, hyperlipidemia, thoracic aortic aneurysm, CKD III-IV, and meningioma, who presents for follow-up after recent elective PCI to the LAD.  Past Medical History    Past Medical History:  Diagnosis Date   Acute blood loss anemia 10/05/2020   Acute respiratory failure ([place]), hypothermia therapy, vent - extubated 10/06/20    AKI (acute kidney injury) ([place]) 10/05/2020   Anoxic brain injury ([place]) 10/26/2020   Arrhythmia    Ascending aortic aneurysm ([place]) 01/29/2018   a. 2019 4.3 cm by echo; b. 02/2021 Echo: Ao root 23m.   Benign brain tumor ([place]    Benign neoplasm of brain ([facility] 12/19/2017   Cardiac arrest with ventricular fibrillation ([place]    Chest pain 12/04/2017   Chronic kidney disease, stage III (moderate) ([place] 12/19/2017   CKD (chronic kidney disease), stage III - IV (HCC)    Coronary Artery Disease    a. 10000000Inf STEMI complicated by cardiac arrest/PCI: LAD 50p/m, LCX 100d (2.5 x 30 Resolute Onyx DES); b. 04/2021 PCI: 04/2021 LM nl, LAD 50p/842m2.5x30 Onyx Frontier DES), LCX 25p, 5070mFR 0.98), 40d ISR (RFR 0.98), OM2 25, RCA mild diff dzs.   Diabetes mellitus, type 2 ([place]1/25/2022   10/06/20 A1C 7%   Dizziness 10/11/2019   Encounter for imaging study to confirm orogastric (OG) tube placement    Hematuria 10/26/2020   HFrEF (heart failure with reduced ejection fraction) ([facility]  a. 11/2020 Echo: EF 25-30%; b. 02/2021 Echo: EF 25-30%, glob HK. Mild LVH. Nl RV fxn. Triv AI. Ao root 30m67m History of pulmonary embolus (PE)    HLD (hyperlipidemia) 10/26/2020   Hypertension    Iron deficiency  anemia rec'd IV iron 10/26/2020   Ischemic cardiomyopathy    a. 11/2020 Echo: EF 25-30%; b. 02/2021 Echo: EF 25-30%, glob HK.   Left bundle branch block 12/19/2017   Meningioma (HCC)[place]/11/2019   Nonsustained ventricular tachycardia (HCC)[place]/29/2021   Pain of left hip joint 05/29/2020   Personal history of pulmonary embolism 12/19/2017   Pneumonia of both lungs due to infectious organism    Pulmonary hypertension due to thromboembolism (HCC)[place]/28/2019   See echo 12/27/17 with nl PAS vs CTa chest 02/23/18    S/P angioplasty with stent 10/04/20 DES to LCX  10/26/2020   Sinus bradycardia 12/22/2017   SOB (shortness of breath)    Solitary pulmonary nodule on lung CT 03/08/2018   CT 12/04/17 1.0 x 0.8 x 0.7 cm nodular opacity in the RUL vs not seen 03/16/10 posterior segment of the right upper lobe. .SpiMarland Kitchenometry 03/08/2018    FEV1 3.86 (108%)  Ratio 96 s prior rx  - PET  03/13/18   Low grade c/w adenoca > rec T surgery eval     STEMI (ST elevation myocardial infarction) (HCC)[place]/23/2022   Urinary retention 10/26/2020   Vestibular schwannoma (HCC)[place]/11/2019   Past Surgical History:  Procedure Laterality Date   APPENDECTOMY     CARDIAC CATHETERIZATION     CORONARY ANGIOPLASTY WITH STENT PLACEMENT Left 04/12/2021   LAD stent placement   CORONARY STENT INTERVENTION N/A 04/12/2021   Procedure: CORONARY  STENT INTERVENTION;  Surgeon: Nelva Bush, MD;  Location: [facility];  Service: Cardiovascular;  Laterality: N/A;   CORONARY/GRAFT ACUTE MI REVASCULARIZATION N/A 10/04/2020   Procedure: Coronary/Graft Acute MI Revascularization;  Surgeon: Nelva Bush, MD;  Location: [facility];  Service: Cardiovascular;  Laterality: N/A;   HERNIA REPAIR     INTRAVASCULAR PRESSURE WIRE/FFR STUDY N/A 04/12/2021   Procedure: INTRAVASCULAR PRESSURE WIRE/FFR STUDY;  Surgeon: Nelva Bush, MD;  Location: [place];  Service: Cardiovascular;  Laterality: N/A;   INTRAVASCULAR ULTRASOUND/IVUS N/A  04/12/2021   Procedure: Intravascular Ultrasound/IVUS;  Surgeon: Nelva Bush, MD;  Location: [facility];  Service: Cardiovascular;  Laterality: N/A;   LEFT HEART CATH AND CORONARY ANGIOGRAPHY N/A 10/04/2020   Procedure: LEFT HEART CATH AND CORONARY ANGIOGRAPHY;  Surgeon: Nelva Bush, MD;  Location: [facility];  Service: Cardiovascular;  Laterality: N/A;   RIGHT/LEFT HEART CATH AND CORONARY ANGIOGRAPHY N/A 04/12/2021   Procedure: RIGHT/LEFT HEART CATH AND CORONARY ANGIOGRAPHY;  Surgeon: Nelva Bush, MD;  Location: [facility];  Service: Cardiovascular;  Laterality: N/A;    Allergies  No Known Allergies  History of Present Illness    75 year old male with the above complex past medical history including CAD, HFrEF, ischemic cardiomyopathy, PE x2, hypertension, hyperlipidemia, thoracic aortic aneurysm, stage III-IV chronic kidney disease, and meningioma.  Patient suffered an inferior STEMI in January [id number] which was complicated by cardiac arrest and acute kidney injury.  He was found to have an occlusion of the distal left circumflex with moderate to severe mid LAD disease.  The circumflex was the infarct vessel successfully treated with a drug-eluting stent.  He had persistent LV dysfunction post MI with an EF of 25 to 30% by echo was in both March 2022 and again in June 2022.  He continues to experience weakness and exertional dyspnea prompting evaluation for PCI of the LAD by Dr. Saunders Revel in July.  Patient admitted to [facility] on August 1 for IV hydration and subsequently underwent diagnostic catheterization with PCI and drug-eluting stent placement to the mid LAD.  Recommendation was made for dual antiplatelet therapy for at least 12 months following the index event in January.  Plan for follow-up echo in approximately 3 months to reevaluate LV function with consideration of CRT-D at that time.  Since his LAD PCI, he has been feeling well.  He has noted significant  improvement in dyspnea and fatigue.  He is interested in cardiac rehab and looking forward to enrolling.  He denies palpitations, PND, orthopnea, dizziness, syncope, edema, or early satiety.  Home Medications    Current Outpatient Medications  Medication Sig Dispense Refill   aspirin 81 MG chewable tablet Chew 1 tablet (81 mg total) by mouth daily.     atorvastatin (LIPITOR) 80 MG tablet Take 1 tablet (80 mg total) by mouth daily. 90 tablet 1   B Complex-C (SUPER B COMPLEX PO) Take 1 tablet by mouth daily.     hydrALAZINE (APRESOLINE) 10 MG tablet Take 1 tablet (10 mg total) by mouth 3 (three) times daily. 270 tablet 1   isosorbide dinitrate (ISORDIL) 30 MG tablet Take 1 tablet (30 mg total) by mouth 2 (two) times daily. 180 tablet 1   melatonin 5 MG TABS Take 1 tablet (5 mg total) by mouth at bedtime. 30 tablet 0   metoprolol succinate (TOPROL-XL) 50 MG 24 hr tablet TAKE 1 TABLET DAILY, WITH  OR IMMEDIATELY FOLLOWING A MEAL 90 tablet 1  Multiple Vitamin (MULTIVITAMIN WITH MINERALS) TABS tablet Take 1 tablet by mouth daily.     nitroGLYCERIN (NITROSTAT) 0.4 MG SL tablet Place 1 tablet (0.4 mg total) under the tongue every 5 (five) minutes as needed for chest pain. 25 tablet 1   ticagrelor (BRILINTA) 90 MG TABS tablet Take 1 tablet (90 mg total) by mouth 2 (two) times daily. 180 tablet 1   valsartan (DIOVAN) 160 MG tablet Take 160 mg by mouth daily.     tamsulosin (FLOMAX) 0.4 MG CAPS capsule Take 1 capsule (0.4 mg total) by mouth daily. (Patient not taking: Reported on 04/29/2021) 30 capsule 0   No current facility-administered medications for this visit.     Review of Systems    Improvement in fatigue and dyspnea.  He denies chest pain, palpitations, PND, orthopnea, dizziness, syncope, edema, or early satiety.  All other systems reviewed and are otherwise negative except as noted above.  Physical Exam    VS:  BP 92/64 (BP Location: Left Arm, Patient Position: Sitting, Cuff Size: Normal)    Pulse 71   Ht 6' 1.5" (1.867 m)   Wt 226 lb 8 oz (102.7 kg)   SpO2 98%   BMI 29.48 kg/m  , BMI Body mass index is 29.48 kg/m.     GEN: Well nourished, well developed, in no acute distress. HEENT: normal. Neck: Supple, no JVD, carotid bruits, or masses. Cardiac: RRR, no murmurs, rubs, or gallops. No clubbing, cyanosis, edema.  Radials/PT 2+ and equal bilaterally.  Right radial catheterization site without bleeding, bruit, or hematoma. Respiratory:  Respirations regular and unlabored, clear to auscultation bilaterally. GI: Soft, nontender, nondistended, BS + x 4. MS: no deformity or atrophy. Skin: warm and dry, no rash. Neuro:  Strength and sensation are intact. Psych: Normal affect.  Accessory Clinical Findings    ECG personally reviewed by me today -sinus rhythm, 71, first-degree AV block, frequent PACs, left axis deviation, left bundle branch block- no acute changes.  Rhythm confirmed by rhythm strip  Lab Results  Component Value Date   WBC 9.8 04/13/2021   HGB 10.0 (L) 04/13/2021   HCT 30.3 (L) 04/13/2021   MCV 93.8 04/13/2021   PLT 242 04/13/2021   Lab Results  Component Value Date   CREATININE 1.98 (H) 04/16/2021   BUN 21 04/16/2021   NA 142 04/16/2021   K 4.8 04/16/2021   CL 107 (H) 04/16/2021   CO2 17 (L) 04/16/2021   Lab Results  Component Value Date   ALT 50 (H) 10/30/2020   AST 43 (H) 10/30/2020   ALKPHOS 77 10/30/2020   BILITOT 0.4 10/30/2020   Lab Results  Component Value Date   CHOL 84 04/13/2021   HDL 26 (L) 04/13/2021   LDLCALC 18 04/13/2021   TRIG 201 (H) 04/13/2021   CHOLHDL 3.2 04/13/2021    Lab Results  Component Value Date   HGBA1C 7.0 (H) 10/06/2020    Assessment & Plan    1.  Coronary artery disease: Status post inferior STEMI in January with circumflex stenting at that time.  In the setting of ongoing dyspnea and fatigue, he underwent staged PCI of the LAD earlier this month.  Since then, he has been feeling well without chest pain  and with improvement in dyspnea/fatigue.  He remains on aspirin, statin, and Brilinta therapy.  He is supposed be taking metoprolol but is not sure if he is taking it.  I encouraged him to check his medicines at home and let us know  as we would like him on metoprolol in the setting of HFrEF and prior VF arrest.  He previously went through cardiac rehabilitation and enjoyed the experience.  He is interested in participating again.  A referral was made at the time of discharge.  He has not heard from them yet and I advised that if he does not hear anything within the next week or so, to let us know and we can send another referral.  2.  Ischemic cardiomyopathy/HFrEF: EF 25 to 30% with global hypokinesis by echo in June.  As above, status post revascularization of the LAD.  He has had significant improvement in fatigue and dyspnea.  He is euvolemic on examination today.  He is unsure if he has been taking his metoprolol or not.  I have reviewed recent notes and all of our notes indicate that he is to remain on it.  As his blood pressure is soft today, I asked him to go home and check his medicines and let us know exactly what he is taking.  We may need to reduce or discontinue his hydralazine/nitrate in order to make room for metoprolol if he is not currently taking.  Continue ARB.  Pressure is too soft to consider transition to [facility].  Likewise, I will hold off on MRA and/or SGLT2 inhibitor in the setting of soft blood pressure and risk for orthostasis.  3.  Essential hypertension: Blood pressure soft today at 92/64.  He sometimes checks it at home and has never seen at this low.  He is asymptomatic.  Continue current regimen including beta-blocker, ARB, hydralazine, and nitrate.  4.  Hyperlipidemia/hypertriglyceridemia: LDL of 18 and August.  He remains on atorvastatin therapy.  Consider Vascepa-defer to primary cardiologist.  5.  Thoracic aortic aneurysm: Stable at 4.0 cm on recent echo.  6.  BPH:  Patient with BPH and urinary catheter.  He has been followed by urology and he would like to have urologic procedure.  We discussed ongoing risks in the setting of dual antiplatelet therapy which at this time cannot be interrupted, as well as cardiomyopathy and coronary artery disease.  7.  CKD III-IV: Creatinine stable at 1.98 on August 5, following intervention.    8.  Disposition: Follow-up in [facility] office in approximately 4 to 6 weeks.   Murray Hodgkins, NP 04/29/2021, 9:25 AM

## 2021-04-29 NOTE — Telephone Encounter (Signed)
  *  STAT* If patient is at the pharmacy, call can be transferred to refill team.   1. Which medications need to be refilled? (please list name of each medication and dose if known) metoprolol succinate (TOPROL-XL) 50 MG 24 hr tablet  2. Which pharmacy/location (including street and city if local pharmacy) is medication to be sent to? [facility] [place], [place] AT Portal to Registered [employer] Sites  3. Do they need a 30 day or 90 day supply? 90 days

## 2021-05-03 ENCOUNTER — Telehealth (HOSPITAL_COMMUNITY): Payer: Self-pay | Admitting: *Deleted

## 2021-05-03 NOTE — Telephone Encounter (Signed)
-----   Message from Theora Gianotti, NP sent at 04/30/2021  1:53 PM EDT ----- Regarding: RE: Cardiac Rehab Hey Devon Pretty,  At 4 cm, this is only very mildly dilated, and has been stable.  I doubt you guys will have him lifting more than what's recommended (75-100 lbs max), and I don't think there are any activities that he needs to avoid, unless you have them doing push ups or something where they'll have to support their entire body weight.  Ideally, BP should remain <140/90.  Take care,  Gerald Stabs ----- Message ----- From: Rowe Pavy, RN Sent: 04/29/2021   2:08 PM EDT To: Theora Gianotti, NP Subject: Cardiac Rehab                                   Gerald Stabs,  The above pt who is eligible for cardiac rehab s/p 8/1 DES LAD.  Pt was contacted on 8/8 and he expressed interest in participating in Cardiac Rehab.  Noted in his history 4.0 cm stable thoracic aneurysm. Any bp parameters we should observe at rest and exertional?  Any activities he should avoid? Any hand weight restriction?  Thanks so much Psychologist, clinical, BSN Cardiac and Training and development officer

## 2021-05-04 ENCOUNTER — Telehealth: Payer: Self-pay | Admitting: Internal Medicine

## 2021-05-04 NOTE — Telephone Encounter (Signed)
Patient calling wants rehab referral to be changed to [facility], [facility]

## 2021-05-04 NOTE — Telephone Encounter (Signed)
Spoke with pt.  He requests cardiac rehab to be changed to [facility] location. Notified pt [facility] does have rehabilitation, however, this is not the same as cardiac rehab program. Pt referred for cardiac rehab s/p stent placement 04/12/21. Pt voiced understanding and states he may discuss need for continuing rehab at next office visit with [name] in October.

## 2021-05-05 ENCOUNTER — Ambulatory Visit: Payer: Medicare Other

## 2021-05-15 ENCOUNTER — Other Ambulatory Visit: Payer: Self-pay | Admitting: Cardiology

## 2021-05-18 DIAGNOSIS — R3914 Feeling of incomplete bladder emptying: Secondary | ICD-10-CM | POA: Diagnosis not present

## 2021-05-18 NOTE — Telephone Encounter (Signed)
Refill for Rosuvastatin 20 mg refused, per note medication was d/c 10/29/20 by Almyra Deforest, PS

## 2021-05-25 DIAGNOSIS — H532 Diplopia: Secondary | ICD-10-CM | POA: Diagnosis not present

## 2021-05-25 DIAGNOSIS — H53483 Generalized contraction of visual field, bilateral: Secondary | ICD-10-CM | POA: Diagnosis not present

## 2021-05-25 DIAGNOSIS — D239 Other benign neoplasm of skin, unspecified: Secondary | ICD-10-CM | POA: Diagnosis not present

## 2021-05-25 DIAGNOSIS — H5000 Unspecified esotropia: Secondary | ICD-10-CM | POA: Diagnosis not present

## 2021-05-25 DIAGNOSIS — D320 Benign neoplasm of cerebral meninges: Secondary | ICD-10-CM | POA: Diagnosis not present

## 2021-05-26 DIAGNOSIS — Z23 Encounter for immunization: Secondary | ICD-10-CM | POA: Diagnosis not present

## 2021-05-27 DIAGNOSIS — N189 Chronic kidney disease, unspecified: Secondary | ICD-10-CM | POA: Diagnosis not present

## 2021-05-27 DIAGNOSIS — N1832 Chronic kidney disease, stage 3b: Secondary | ICD-10-CM | POA: Diagnosis not present

## 2021-05-31 ENCOUNTER — Telehealth: Payer: Self-pay | Admitting: Cardiology

## 2021-05-31 DIAGNOSIS — I2510 Atherosclerotic heart disease of native coronary artery without angina pectoris: Secondary | ICD-10-CM | POA: Diagnosis not present

## 2021-05-31 DIAGNOSIS — D631 Anemia in chronic kidney disease: Secondary | ICD-10-CM | POA: Diagnosis not present

## 2021-05-31 DIAGNOSIS — I5022 Chronic systolic (congestive) heart failure: Secondary | ICD-10-CM | POA: Diagnosis not present

## 2021-05-31 DIAGNOSIS — N400 Enlarged prostate without lower urinary tract symptoms: Secondary | ICD-10-CM | POA: Diagnosis not present

## 2021-05-31 DIAGNOSIS — N179 Acute kidney failure, unspecified: Secondary | ICD-10-CM | POA: Diagnosis not present

## 2021-05-31 DIAGNOSIS — I129 Hypertensive chronic kidney disease with stage 1 through stage 4 chronic kidney disease, or unspecified chronic kidney disease: Secondary | ICD-10-CM | POA: Diagnosis not present

## 2021-05-31 DIAGNOSIS — N1832 Chronic kidney disease, stage 3b: Secondary | ICD-10-CM | POA: Diagnosis not present

## 2021-05-31 NOTE — Telephone Encounter (Signed)
Pt c/o medication issue:  1. Name of Medication: ticagrelor (BRILINTA) 90 MG TABS tablet  2. How are you currently taking this medication (dosage and times per day)? 1 tablet twice a day  3. Are you having a reaction (difficulty breathing--STAT)? no  4. What is your medication issue? Patient states the medication is very expensive and would like to know if he could take a generic.

## 2021-06-01 NOTE — Telephone Encounter (Signed)
Patient called back looking for update. Please advise

## 2021-06-02 NOTE — Telephone Encounter (Signed)
Pt called but no answer or VM.

## 2021-06-03 MED ORDER — TICAGRELOR 90 MG PO TABS
90.0000 mg | ORAL_TABLET | Freq: Two times a day (BID) | ORAL | 1 refills | Status: DC
Start: 2021-06-03 — End: 2021-08-23

## 2021-06-03 NOTE — Telephone Encounter (Signed)
Spoke with the patient just now and let him know Dr. Wendy Poet recommendations. He states that he will stay on the Brilinta for now. I sent in a refill for him at this request.    Encouraged patient to call back with any questions or concerns.

## 2021-06-03 NOTE — Addendum Note (Signed)
Addended by: Resa Miner I on: 06/03/2021 08:05 AM   Modules accepted: Orders

## 2021-06-04 ENCOUNTER — Telehealth (HOSPITAL_COMMUNITY): Payer: Self-pay

## 2021-06-04 NOTE — Telephone Encounter (Signed)
Called and spoke with pt in regards to CR, pt stated he is not interested at this time.   Closed referral 

## 2021-06-07 ENCOUNTER — Telehealth: Payer: Self-pay | Admitting: Cardiology

## 2021-06-07 NOTE — Telephone Encounter (Signed)
New Message:     Patient wants to know if Dr Agustin Cree thinks it is alright for him to do Cardiac Rehab?

## 2021-06-08 NOTE — Telephone Encounter (Signed)
Left message for patient to return call.

## 2021-06-09 NOTE — Telephone Encounter (Signed)
Called patient. Informed him that [name] said it is ok to do cardaic rehab. I placed referral as patient requested. No further questions.

## 2021-06-14 DIAGNOSIS — R3914 Feeling of incomplete bladder emptying: Secondary | ICD-10-CM | POA: Diagnosis not present

## 2021-06-28 DIAGNOSIS — I5020 Unspecified systolic (congestive) heart failure: Secondary | ICD-10-CM | POA: Insufficient documentation

## 2021-06-30 ENCOUNTER — Other Ambulatory Visit: Payer: Self-pay

## 2021-06-30 ENCOUNTER — Ambulatory Visit (INDEPENDENT_AMBULATORY_CARE_PROVIDER_SITE_OTHER): Payer: Medicare Other | Admitting: Cardiology

## 2021-06-30 ENCOUNTER — Encounter: Payer: Self-pay | Admitting: Cardiology

## 2021-06-30 VITALS — BP 100/76 | HR 64 | Ht 73.0 in | Wt 228.0 lb

## 2021-06-30 DIAGNOSIS — I25118 Atherosclerotic heart disease of native coronary artery with other forms of angina pectoris: Secondary | ICD-10-CM

## 2021-06-30 DIAGNOSIS — I2699 Other pulmonary embolism without acute cor pulmonale: Secondary | ICD-10-CM

## 2021-06-30 DIAGNOSIS — I447 Left bundle-branch block, unspecified: Secondary | ICD-10-CM

## 2021-06-30 DIAGNOSIS — I7121 Aneurysm of the ascending aorta, without rupture: Secondary | ICD-10-CM

## 2021-06-30 DIAGNOSIS — I4901 Ventricular fibrillation: Secondary | ICD-10-CM

## 2021-06-30 DIAGNOSIS — I469 Cardiac arrest, cause unspecified: Secondary | ICD-10-CM | POA: Diagnosis not present

## 2021-06-30 DIAGNOSIS — I2729 Other secondary pulmonary hypertension: Secondary | ICD-10-CM

## 2021-06-30 NOTE — Progress Notes (Signed)
Cardiology Office Note:    Date:  06/30/2021   ID:  Timothy Moran, DOB 11/30/1945, MRN [id number]  PCP:  Cari Caraway, MD  Cardiologist:  Jenne Campus, MD    Referring MD: Cari Caraway, MD   Chief Complaint  Patient presents with   Follow-up  I am doing very well  History of Present Illness:    Timothy Moran is a 75 y.o. male   with incredibly complex past medical history.  Initially he was seen in my office because of history of pulmonary emboli he did have mild pulmonary hypertension but overall was doing quite well.  However at the end of January he was shoveling the snow started having chest pain 1 back and collapse he was found to be in V. fib he was defibrillated brought to [facility] he was found to have acute STEMI involving circumflex artery that was addressed with PTCA and drug-eluting stent.  He does have residual 80% stenosis of the mid LAD he ended up having diminished ejection fraction with level of 25%.  He did wear LifeVest however absolutely hated this and does not want to wear it anymore.  He comes today to my office complaining of having some fatigue tiredness as well as dizziness" upon getting up. In August he did have PTCA and stenting of the LAD lesion he tolerated quite well and since that time he is doing much better.  He said he got much more energy.  He does not get dizzy anymore.  He actually probably told me that he was cutting grass with self-propelled lawnmower.  Denies have any palpitation no tightness squeezing pressure burning chest no swelling of lower extremities overall looks good.  Past Medical History:  Diagnosis Date   Acute blood loss anemia 10/05/2020   Acute respiratory failure ([place]), hypothermia therapy, vent - extubated 10/06/20    AKI (acute kidney injury) ([place]) 10/05/2020   Anoxic brain injury ([place]) 10/26/2020   Arrhythmia    Ascending aortic aneurysm 01/29/2018   a. 2019 4.3 cm by echo; b. 02/2021 Echo: Ao root 63mm.   Benign  brain tumor ([place])    Benign neoplasm of brain ([place]) 12/19/2017   Cardiac arrest with ventricular fibrillation ([place])    Chest pain 12/04/2017   Chronic kidney disease, stage III (moderate) ([place]) 12/19/2017   CKD (chronic kidney disease), stage III - IV (HCC)    Coronary Artery Disease    a. 10/8364 Inf STEMI complicated by cardiac arrest/PCI: LAD 50p/m, LCX 100d (2.5 x 30 Resolute Onyx DES); b. 04/2021 PCI: 04/2021 LM nl, LAD 50p/91m (2.5x30 Onyx Frontier DES), LCX 25p, 26m (RFR 0.98), 40d ISR (RFR 0.98), OM2 25, RCA mild diff dzs.   Diabetes mellitus, type 2 ([place]) 10/06/2020   10/06/20 A1C 7%   Dizziness 10/11/2019   Encounter for imaging study to confirm orogastric (OG) tube placement    Hematuria 10/26/2020   HFrEF (heart failure with reduced ejection fraction) ([place])    a. 11/2020 Echo: EF 25-30%; b. 02/2021 Echo: EF 25-30%, glob HK. Mild LVH. Nl RV fxn. Triv AI. Ao root 74mm.   History of pulmonary embolus (PE)    HLD (hyperlipidemia) 10/26/2020   Hypertension    Iron deficiency anemia rec'd IV iron 10/26/2020   Ischemic cardiomyopathy    a. 11/2020 Echo: EF 25-30%; b. 02/2021 Echo: EF 25-30%, glob HK.   Left bundle branch block 12/19/2017   Meningioma ([place]) 08/14/2020   Nonsustained ventricular tachycardia 10/11/2019   Pain of left hip joint  05/29/2020   Personal history of pulmonary embolism 12/19/2017   Pneumonia of both lungs due to infectious organism    Pulmonary hypertension due to thromboembolism ([place]) 03/09/2018   See echo 12/27/17 with nl PAS vs CTa chest 02/23/18    S/P angioplasty with stent 10/04/20 DES to LCX  10/26/2020   Sinus bradycardia 12/22/2017   SOB (shortness of breath)    Solitary pulmonary nodule on lung CT 03/08/2018   CT 12/04/17 1.0 x 0.8 x 0.7 cm nodular opacity in the RUL vs not seen 03/16/10 posterior segment of the right upper lobe. Marland KitchenSpirometry 03/08/2018    FEV1 3.86 (108%)  Ratio 96 s prior rx  - PET  03/13/18   Low grade c/w adenoca > rec T surgery eval      STEMI (ST elevation myocardial infarction) ([place]) 10/04/2020   Urinary retention 10/26/2020   Vestibular schwannoma ([place]) 08/14/2020    Past Surgical History:  Procedure Laterality Date   APPENDECTOMY     CARDIAC CATHETERIZATION     CORONARY ANGIOPLASTY WITH STENT PLACEMENT Left 04/12/2021   LAD stent placement   CORONARY STENT INTERVENTION N/A 04/12/2021   Procedure: CORONARY STENT INTERVENTION;  Surgeon: Nelva Bush, MD;  Location: [facility];  Service: Cardiovascular;  Laterality: N/A;   CORONARY/GRAFT ACUTE MI REVASCULARIZATION N/A 10/04/2020   Procedure: Coronary/Graft Acute MI Revascularization;  Surgeon: Nelva Bush, MD;  Location: [facility];  Service: Cardiovascular;  Laterality: N/A;   HERNIA REPAIR     INTRAVASCULAR PRESSURE WIRE/FFR STUDY N/A 04/12/2021   Procedure: INTRAVASCULAR PRESSURE WIRE/FFR STUDY;  Surgeon: Nelva Bush, MD;  Location: [facility];  Service: Cardiovascular;  Laterality: N/A;   INTRAVASCULAR ULTRASOUND/IVUS N/A 04/12/2021   Procedure: Intravascular Ultrasound/IVUS;  Surgeon: Nelva Bush, MD;  Location: [facility];  Service: Cardiovascular;  Laterality: N/A;   LEFT HEART CATH AND CORONARY ANGIOGRAPHY N/A 10/04/2020   Procedure: LEFT HEART CATH AND CORONARY ANGIOGRAPHY;  Surgeon: Nelva Bush, MD;  Location: [facility];  Service: Cardiovascular;  Laterality: N/A;   RIGHT/LEFT HEART CATH AND CORONARY ANGIOGRAPHY N/A 04/12/2021   Procedure: RIGHT/LEFT HEART CATH AND CORONARY ANGIOGRAPHY;  Surgeon: Nelva Bush, MD;  Location: [facility];  Service: Cardiovascular;  Laterality: N/A;    Current Medications: Current Meds  Medication Sig   aspirin 81 MG chewable tablet Chew 1 tablet (81 mg total) by mouth daily.   atorvastatin (LIPITOR) 80 MG tablet TAKE 1 TABLET BY MOUTH EVERY DAY (Patient taking differently: Take 80 mg by mouth daily.)   B Complex-C (SUPER B COMPLEX PO) Take 1 tablet by mouth  daily.   hydrALAZINE (APRESOLINE) 10 MG tablet Take 1 tablet (10 mg total) by mouth 3 (three) times daily.   isosorbide dinitrate (ISORDIL) 30 MG tablet Take 1 tablet (30 mg total) by mouth 2 (two) times daily.   melatonin 5 MG TABS Take 1 tablet (5 mg total) by mouth at bedtime.   metoprolol succinate (TOPROL-XL) 50 MG 24 hr tablet TAKE 1 TABLET DAILY, WITH  OR IMMEDIATELY FOLLOWING A MEAL (Patient taking differently: Take 50 mg by mouth daily. TAKE 1 TABLET DAILY, WITH  OR IMMEDIATELY FOLLOWING A MEAL)   Multiple Vitamin (MULTIVITAMIN WITH MINERALS) TABS tablet Take 1 tablet by mouth daily.   ticagrelor (BRILINTA) 90 MG TABS tablet Take 1 tablet (90 mg total) by mouth 2 (two) times daily.   valsartan (DIOVAN) 160 MG tablet Take 160 mg by mouth daily.  Allergies:   Patient has no known allergies.   Social History   Socioeconomic History   Marital status: Married    Spouse name: Jace Fermin    Number of children: 1   Years of education: Not on file   Highest education level: Not on file  Occupational History   Not on file  Tobacco Use   Smoking status: Former    Packs/day: 0.50    Years: 5.00    Pack years: 2.50    Types: Cigarettes    Quit date: 09/12/1968    Years since quitting: 52.8   Smokeless tobacco: Never  Vaping Use   Vaping Use: Never used  Substance and Sexual Activity   Alcohol use: Yes    Comment: ONE PER WEEK   Drug use: Never   Sexual activity: Not Currently  Other Topics Concern   Not on file  Social History Narrative   Not on file   Social Determinants of Health   Financial Resource Strain: Not on file  Food Insecurity: Not on file  Transportation Needs: Not on file  Physical Activity: Not on file  Stress: Not on file  Social Connections: Not on file     Family History: The patient's family history includes Brain cancer in his sister; Diabetes in his mother; Leukemia in his brother; Melanoma in his brother. ROS:   Please see the history  of present illness.    All 14 point review of systems negative except as described per history of present illness  EKGs/Labs/Other Studies Reviewed:      Recent Labs: 10/06/2020: TSH 3.917 10/08/2020: B Natriuretic Peptide 1,434.0 10/12/2020: Magnesium 2.2 10/30/2020: ALT 50 12/28/2020: NT-Pro BNP 2,733 04/13/2021: Hemoglobin 10.0; Platelets 242 04/16/2021: BUN 21; Creatinine, Ser 1.98; Potassium 4.8; Sodium 142  Recent Lipid Panel    Component Value Date/Time   CHOL 84 04/13/2021 0317   CHOL 247 (H) 06/01/2020 0858   TRIG 201 (H) 04/13/2021 0317   HDL 26 (L) 04/13/2021 0317   HDL 33 (L) 06/01/2020 0858   CHOLHDL 3.2 04/13/2021 0317   VLDL 40 04/13/2021 0317   LDLCALC 18 04/13/2021 0317   LDLCALC 151 (H) 06/01/2020 0858    Physical Exam:    VS:  BP 100/76 (BP Location: Left Arm, Patient Position: Sitting)   Pulse 64   Ht 6\' 1"  (1.854 m)   Wt 228 lb (103.4 kg)   SpO2 96%   BMI 30.08 kg/m     Wt Readings from Last 3 Encounters:  06/30/21 228 lb (103.4 kg)  04/29/21 226 lb 8 oz (102.7 kg)  04/12/21 225 lb (102.1 kg)     GEN:  Well nourished, well developed in no acute distress HEENT: Normal NECK: No JVD; No carotid bruits LYMPHATICS: No lymphadenopathy CARDIAC: RRR, no murmurs, no rubs, no gallops RESPIRATORY:  Clear to auscultation without rales, wheezing or rhonchi  ABDOMEN: Soft, non-tender, non-distended MUSCULOSKELETAL:  No edema; No deformity  SKIN: Warm and dry LOWER EXTREMITIES: no swelling NEUROLOGIC:  Alert and oriented x 3 PSYCHIATRIC:  Normal affect   ASSESSMENT:    1. Coronary artery disease of native artery of native heart with stable angina pectoris ([place])   2. Cardiac arrest with ventricular fibrillation ([place])   3. Pulmonary hypertension due to thromboembolism ([place])   4. Aneurysm of ascending aorta without rupture   5. Left bundle branch block    PLAN:    In order of problems listed above:  Coronary disease status post recent PTCA and  stenting  of the LAD disease with drug-eluting stent.  He need to be on dual antiplatelet therapy for the minimum 6 months in his case I would prefer 1 year.  He does have chronic prostate problem he does have indwelling urinary catheter and he will need to have TURP done.  However, I prefer not to interrupt his dual antiplatelet therapy until minimum 6 months since his recent stent implantation.  And that is what I told him.  He denies having any anginal symptoms.  Overall he is doing very well. Status post cardiac arrest.  I will repeat his echocardiogram.  Based on echocardiogram will decide if we need to get an ICD. Pulmonary hypertension stable disease.  Prior thromboembolic events. Left bundle branch block.  Noted Chronic kidney failure, will check his Chem-7 today Cardiomyopathy with severely reduced left ventricle ejection fraction difficulty I have is the fact that he does have kidney dysfunction as well as the fact that his prep is very soft.  I will repeat his echocardiogram based on that decide what will be neck step in terms of management of his cardiomyopathy.  He may require an ICD.   Medication Adjustments/Labs and Tests Ordered: Current medicines are reviewed at length with the patient today.  Concerns regarding medicines are outlined above.  No orders of the defined types were placed in this encounter.  Medication changes: No orders of the defined types were placed in this encounter.   Signed, Park Liter, MD, [facility] 06/30/2021 10:34 AM    [place]

## 2021-06-30 NOTE — Addendum Note (Signed)
Addended by: Senaida Ores on: 06/30/2021 10:43 AM   Modules accepted: Orders

## 2021-06-30 NOTE — Patient Instructions (Signed)
Medication Instructions:  Your physician recommends that you continue on your current medications as directed. Please refer to the Current Medication list given to you today.  *If you need a refill on your cardiac medications before your next appointment, please call your pharmacy*   Lab Work: Your physician recommends that you return for lab work today: [place]  If you have labs (blood work) drawn today and your tests are completely normal, you will receive your results only by: [place] (if you have MyChart) OR A paper copy in the mail If you have any lab test that is abnormal or we need to change your treatment, we will call you to review the results.   Testing/Procedures: Your physician has requested that you have an echocardiogram. Echocardiography is a painless test that uses sound waves to create images of your heart. It provides your doctor with information about the size and shape of your heart and how well your heart's chambers and valves are working. This procedure takes approximately one hour. There are no restrictions for this procedure.    Follow-Up: At [facility], you and your health needs are our priority.  As part of our continuing mission to provide you with exceptional heart care, we have created designated Provider Care Teams.  These Care Teams include your primary Cardiologist (physician) and Advanced Practice Providers (APPs -  Physician Assistants and Nurse Practitioners) who all work together to provide you with the care you need, when you need it.  We recommend signing up for the patient portal called "MyChart".  Sign up information is provided on this After Visit Summary.  MyChart is used to connect with patients for Virtual Visits (Telemedicine).  Patients are able to view lab/test results, encounter notes, upcoming appointments, etc.  Non-urgent messages can be sent to your provider as well.   To learn more about what you can do with MyChart, go to  [email or web address].    Your next appointment:   3 month(s)  The format for your next appointment:   In Person  Provider:   Jenne Campus, MD   Other Instructions

## 2021-07-01 LAB — BASIC METABOLIC PANEL
BUN/Creatinine Ratio: 14 (ref 10–24)
BUN: 30 mg/dL — ABNORMAL HIGH (ref 8–27)
CO2: 18 mmol/L — ABNORMAL LOW (ref 20–29)
Calcium: 9.2 mg/dL (ref 8.6–10.2)
Chloride: 106 mmol/L (ref 96–106)
Creatinine, Ser: 2.15 mg/dL — ABNORMAL HIGH (ref 0.76–1.27)
Glucose: 105 mg/dL — ABNORMAL HIGH (ref 70–99)
Potassium: 4.9 mmol/L (ref 3.5–5.2)
Sodium: 141 mmol/L (ref 134–144)
eGFR: 31 mL/min/{1.73_m2} — ABNORMAL LOW (ref >59)

## 2021-07-02 ENCOUNTER — Telehealth: Payer: Self-pay | Admitting: Emergency Medicine

## 2021-07-02 DIAGNOSIS — I4901 Ventricular fibrillation: Secondary | ICD-10-CM

## 2021-07-02 DIAGNOSIS — R7989 Other specified abnormal findings of blood chemistry: Secondary | ICD-10-CM

## 2021-07-02 NOTE — Telephone Encounter (Signed)
-----   Message from Truddie Hidden, RN sent at 07/01/2021 11:47 AM EDT -----  ----- Message ----- From: Park Liter, MD Sent: 07/01/2021  11:39 AM EDT To: Truddie Hidden, RN  Chem-7 showing increased creatinine again still better than before but somewhat concerning.  I recommend to repeat Chem-7 in about 10 days

## 2021-07-02 NOTE — Telephone Encounter (Signed)
Patient informed of results. He will repeat labs in 10 days. Patient also asking for cardiac rehab number, placed new referral and sent Danae Orleans another message regarding this.

## 2021-07-08 DIAGNOSIS — R7989 Other specified abnormal findings of blood chemistry: Secondary | ICD-10-CM | POA: Diagnosis not present

## 2021-07-09 DIAGNOSIS — D320 Benign neoplasm of cerebral meninges: Secondary | ICD-10-CM | POA: Diagnosis not present

## 2021-07-09 DIAGNOSIS — D329 Benign neoplasm of meninges, unspecified: Secondary | ICD-10-CM | POA: Diagnosis not present

## 2021-07-09 DIAGNOSIS — Z510 Encounter for antineoplastic radiation therapy: Secondary | ICD-10-CM | POA: Diagnosis not present

## 2021-07-09 LAB — BASIC METABOLIC PANEL
BUN/Creatinine Ratio: 13 (ref 10–24)
BUN: 32 mg/dL — ABNORMAL HIGH (ref 8–27)
CO2: 17 mmol/L — ABNORMAL LOW (ref 20–29)
Calcium: 9.3 mg/dL (ref 8.6–10.2)
Chloride: 111 mmol/L — ABNORMAL HIGH (ref 96–106)
Creatinine, Ser: 2.48 mg/dL — ABNORMAL HIGH (ref 0.76–1.27)
Glucose: 99 mg/dL (ref 70–99)
Potassium: 4.7 mmol/L (ref 3.5–5.2)
Sodium: 144 mmol/L (ref 134–144)
eGFR: 26 mL/min/{1.73_m2} — ABNORMAL LOW (ref >59)

## 2021-07-12 ENCOUNTER — Other Ambulatory Visit: Payer: Self-pay | Admitting: Physical Medicine and Rehabilitation

## 2021-07-15 ENCOUNTER — Telehealth: Payer: Self-pay | Admitting: Emergency Medicine

## 2021-07-15 DIAGNOSIS — Z79899 Other long term (current) drug therapy: Secondary | ICD-10-CM

## 2021-07-15 NOTE — Telephone Encounter (Signed)
-----   Message from Park Liter, MD sent at 07/13/2021  2:26 PM EDT ----- Kidney function seems to be worse, please cut down valsartan to only half a tablet daily, Chem-7 need to be repeated in 2 weeks

## 2021-07-15 NOTE — Telephone Encounter (Signed)
Patient informed of results he will decrease valsartan to 80 mg daily and repeat labs in 1 week. He understood. No further questions.

## 2021-07-16 ENCOUNTER — Ambulatory Visit (HOSPITAL_BASED_OUTPATIENT_CLINIC_OR_DEPARTMENT_OTHER)
Admission: RE | Admit: 2021-07-16 | Discharge: 2021-07-16 | Disposition: A | Discharge status: Home / Self Care | Payer: Medicare Other | Source: Ambulatory Visit | Attending: Cardiology | Admitting: Cardiology

## 2021-07-16 ENCOUNTER — Other Ambulatory Visit: Payer: Self-pay

## 2021-07-16 DIAGNOSIS — I2729 Other secondary pulmonary hypertension: Secondary | ICD-10-CM | POA: Insufficient documentation

## 2021-07-16 DIAGNOSIS — I447 Left bundle-branch block, unspecified: Secondary | ICD-10-CM | POA: Diagnosis not present

## 2021-07-16 DIAGNOSIS — I4901 Ventricular fibrillation: Secondary | ICD-10-CM | POA: Diagnosis not present

## 2021-07-16 DIAGNOSIS — I7121 Aneurysm of the ascending aorta, without rupture: Secondary | ICD-10-CM | POA: Diagnosis not present

## 2021-07-16 DIAGNOSIS — I25118 Atherosclerotic heart disease of native coronary artery with other forms of angina pectoris: Secondary | ICD-10-CM | POA: Insufficient documentation

## 2021-07-16 DIAGNOSIS — I469 Cardiac arrest, cause unspecified: Secondary | ICD-10-CM | POA: Diagnosis not present

## 2021-07-16 DIAGNOSIS — I2699 Other pulmonary embolism without acute cor pulmonale: Secondary | ICD-10-CM | POA: Diagnosis not present

## 2021-07-16 LAB — ECHOCARDIOGRAM COMPLETE
AR max vel: 2.86 cm2
AV Area VTI: 3.05 cm2
AV Area mean vel: 3.02 cm2
AV Mean grad: 5 mmHg
AV Peak grad: 9.4 mmHg
Ao pk vel: 1.53 m/s
Area-P 1/2: 3.72 cm2
P 1/2 time: 1029 msec
S' Lateral: 5.2 cm

## 2021-07-16 MED ORDER — PERFLUTREN LIPID MICROSPHERE
1.0000 mL | INTRAVENOUS | Status: AC | PRN
  Administered 2021-07-16: 4 mL via INTRAVENOUS

## 2021-07-19 ENCOUNTER — Telehealth: Payer: Self-pay | Admitting: Cardiology

## 2021-07-19 MED ORDER — HYDRALAZINE HCL 10 MG PO TABS: 10.0000 mg | ORAL_TABLET | Freq: Three times a day (TID) | ORAL | 1 refills | Status: DC

## 2021-07-19 NOTE — Telephone Encounter (Signed)
*  STAT* If patient is at the pharmacy, call can be transferred to refill team.   1. Which medications need to be refilled? (please list name of each medication and dose if known)  hydrALAZINE (APRESOLINE) 10 MG tablet  2. Which pharmacy/location (including street and city if local pharmacy) is medication to be sent to? [facility] [place] [place] to Registered [facility] Sites  3. Do they need a 30 day or 90 day supply? 90 day supply

## 2021-07-19 NOTE — Telephone Encounter (Signed)
Medication filled.  

## 2021-07-20 ENCOUNTER — Telehealth: Payer: Self-pay | Admitting: Emergency Medicine

## 2021-07-20 ENCOUNTER — Other Ambulatory Visit: Payer: Self-pay | Admitting: Cardiology

## 2021-07-20 DIAGNOSIS — I4901 Ventricular fibrillation: Secondary | ICD-10-CM

## 2021-07-20 DIAGNOSIS — R338 Other retention of urine: Secondary | ICD-10-CM | POA: Diagnosis not present

## 2021-07-20 DIAGNOSIS — I469 Cardiac arrest, cause unspecified: Secondary | ICD-10-CM

## 2021-07-20 DIAGNOSIS — I5022 Chronic systolic (congestive) heart failure: Secondary | ICD-10-CM

## 2021-07-20 NOTE — Telephone Encounter (Signed)
Patient informed of results. Referral placed.

## 2021-07-20 NOTE — Telephone Encounter (Signed)
-----   Message from Park Liter, MD sent at 07/20/2021 11:39 AM EST ----- Echocardiogram showed improvement left ventricle ejection fraction 30 to 35% as compared to before 25 to 30%.  Ascending aorta measuring 44 mm.  Medical therapy.  Patient need to have a referral to EP for consideration of ICD

## 2021-07-22 ENCOUNTER — Encounter (HOSPITAL_COMMUNITY)
Admission: RE | Admit: 2021-07-22 | Discharge: 2021-07-22 | Disposition: A | Discharge status: Home / Self Care | Payer: Medicare Other | Source: Ambulatory Visit | Attending: Cardiology | Admitting: Cardiology

## 2021-07-22 ENCOUNTER — Encounter (HOSPITAL_COMMUNITY): Payer: Self-pay

## 2021-07-22 ENCOUNTER — Other Ambulatory Visit: Payer: Self-pay

## 2021-07-22 ENCOUNTER — Telehealth (HOSPITAL_COMMUNITY): Payer: Self-pay | Admitting: *Deleted

## 2021-07-22 ENCOUNTER — Telehealth (HOSPITAL_COMMUNITY): Payer: Self-pay

## 2021-07-22 VITALS — BP 122/64 | HR 69 | Ht 72.25 in | Wt 226.9 lb

## 2021-07-22 DIAGNOSIS — Z955 Presence of coronary angioplasty implant and graft: Secondary | ICD-10-CM | POA: Insufficient documentation

## 2021-07-22 NOTE — Telephone Encounter (Signed)
Spoke with Timothy Moran. Clearance granted by Dr Agustin Cree for the patient to proceed with exercise at cardiac rehab. Patient will arrive today at 10:30 to proceed with orientation.Barnet Pall, RN,BSN 07/22/2021 8:59 AM

## 2021-07-22 NOTE — Telephone Encounter (Signed)
Per [name] patient is to go ahead and start cardiac rehab before he gets his icd. Verdis Frederickson, RN aware.

## 2021-07-22 NOTE — Telephone Encounter (Signed)
Pt insurance is active and benefits verified through Medicare a/b Co-pay 0, DED $233/$233 met, out of pocket 0/0 met, co-insurance 20%. mp pre-authorization required. Passport, 07/22/2021_0 :11am, REF# [id number]   2ndary insurance is active and benefits verified through [place]. Co-pay 0, DED 0/0 met, out of pocket 0/0 met, co-insurance 0%. No pre-authorization required. Passport, 07/22/2021_1 :19am, REF# [id number]   Will contact patient to see if he is interested in the Cardiac Rehab Program. If interested, patient will need to complete follow up appt. Once completed, patient will be contacted for scheduling upon review by the RN Navigator.

## 2021-07-22 NOTE — Progress Notes (Signed)
Cardiac Rehab Medication Review by a Nurse Does the patient  feel that his/her medications are working for him/her?  YES   Has the patient been experiencing any side effects to the medications prescribed?  NO  Does the patient measure his/her own blood pressure or blood glucose at home?  YES   Does the patient have any problems obtaining medications due to transportation or finances?    NO  Understanding of regimen: good Understanding of indications: good Potential of compliance: good    Nurse comments: Timothy Moran is taking his medications as prescribed and has a good understanding of what his medications are for.    Harrell Gave RN 07/22/2021 4:44 PM

## 2021-07-22 NOTE — Progress Notes (Addendum)
Intermittent PVC's noted frequent at times. [place] noted after walk test completed  Patient asymptomatic. Blood pressure 122/64 resting heart rate 71. Oxygen saturations 97% on room air. Angie Duke PA paged and notified. Angie said okay to proceed with 6 minute walk test. Will need to get clearance from Dr Agustin Cree before proceeding with exercise regarding PVC's will fax today's ECG  tracings to Dr Orpah Clinton office after 6 minute walk test and get input. Discussed with the patient we will contact him as to whether he can begin exercise or not. Patient states understanding. Barnet Pall, RN,BSN 07/22/2021 11:38 AM

## 2021-07-23 ENCOUNTER — Telehealth (HOSPITAL_COMMUNITY): Payer: Self-pay | Admitting: *Deleted

## 2021-07-23 NOTE — Telephone Encounter (Signed)
Patient notified not to start until after seen by Electrophysiologist. Mr Redfield states understanding.Barnet Pall, RN,BSN 07/23/2021 12:34 PM

## 2021-07-23 NOTE — Telephone Encounter (Signed)
Left message to call cardiac rehab. Received notification From Dr Marcelline Deist nurse Angie Fava that Dr Wendy Poet office to hold off beginning exercise at cardiac rehab for now.Barnet Pall, RN,BSN 07/23/2021 10:15 AM

## 2021-07-25 ENCOUNTER — Other Ambulatory Visit: Payer: Self-pay | Admitting: Cardiology

## 2021-07-26 ENCOUNTER — Ambulatory Visit (HOSPITAL_COMMUNITY): Payer: Medicare Other

## 2021-07-26 DIAGNOSIS — Z79899 Other long term (current) drug therapy: Secondary | ICD-10-CM | POA: Diagnosis not present

## 2021-07-27 LAB — BASIC METABOLIC PANEL
BUN/Creatinine Ratio: 12 (ref 10–24)
BUN: 26 mg/dL (ref 8–27)
CO2: 17 mmol/L — ABNORMAL LOW (ref 20–29)
Calcium: 9.2 mg/dL (ref 8.6–10.2)
Chloride: 111 mmol/L — ABNORMAL HIGH (ref 96–106)
Creatinine, Ser: 2.1 mg/dL — ABNORMAL HIGH (ref 0.76–1.27)
Glucose: 112 mg/dL — ABNORMAL HIGH (ref 70–99)
Potassium: 4.3 mmol/L (ref 3.5–5.2)
Sodium: 143 mmol/L (ref 134–144)
eGFR: 32 mL/min/{1.73_m2} — ABNORMAL LOW (ref >59)

## 2021-07-28 ENCOUNTER — Ambulatory Visit (HOSPITAL_COMMUNITY): Payer: Medicare Other

## 2021-07-29 DIAGNOSIS — N281 Cyst of kidney, acquired: Secondary | ICD-10-CM | POA: Diagnosis not present

## 2021-07-29 DIAGNOSIS — R3914 Feeling of incomplete bladder emptying: Secondary | ICD-10-CM | POA: Diagnosis not present

## 2021-07-29 DIAGNOSIS — R3912 Poor urinary stream: Secondary | ICD-10-CM | POA: Diagnosis not present

## 2021-07-30 ENCOUNTER — Ambulatory Visit (HOSPITAL_COMMUNITY): Payer: Medicare Other

## 2021-08-02 ENCOUNTER — Ambulatory Visit (HOSPITAL_COMMUNITY): Payer: Medicare Other

## 2021-08-02 DIAGNOSIS — N1832 Chronic kidney disease, stage 3b: Secondary | ICD-10-CM | POA: Diagnosis not present

## 2021-08-02 DIAGNOSIS — M545 Low back pain, unspecified: Secondary | ICD-10-CM | POA: Diagnosis not present

## 2021-08-04 ENCOUNTER — Ambulatory Visit (HOSPITAL_COMMUNITY): Payer: Medicare Other

## 2021-08-06 ENCOUNTER — Ambulatory Visit (HOSPITAL_COMMUNITY): Payer: Medicare Other

## 2021-08-09 ENCOUNTER — Ambulatory Visit (HOSPITAL_COMMUNITY): Payer: Medicare Other

## 2021-08-11 ENCOUNTER — Ambulatory Visit (HOSPITAL_COMMUNITY): Payer: Medicare Other

## 2021-08-13 ENCOUNTER — Ambulatory Visit (HOSPITAL_COMMUNITY): Payer: Medicare Other

## 2021-08-16 ENCOUNTER — Ambulatory Visit (HOSPITAL_COMMUNITY): Payer: Medicare Other

## 2021-08-16 DIAGNOSIS — M7918 Myalgia, other site: Secondary | ICD-10-CM | POA: Diagnosis not present

## 2021-08-16 DIAGNOSIS — M545 Low back pain, unspecified: Secondary | ICD-10-CM | POA: Diagnosis not present

## 2021-08-16 DIAGNOSIS — Z6831 Body mass index (BMI) 31.0-31.9, adult: Secondary | ICD-10-CM | POA: Diagnosis not present

## 2021-08-17 ENCOUNTER — Other Ambulatory Visit: Payer: Self-pay | Admitting: *Deleted

## 2021-08-17 DIAGNOSIS — I712 Thoracic aortic aneurysm, without rupture, unspecified: Secondary | ICD-10-CM

## 2021-08-17 NOTE — Progress Notes (Unsigned)
Ct

## 2021-08-18 ENCOUNTER — Ambulatory Visit (HOSPITAL_COMMUNITY): Payer: Medicare Other

## 2021-08-20 ENCOUNTER — Ambulatory Visit (HOSPITAL_COMMUNITY): Payer: Medicare Other

## 2021-08-23 ENCOUNTER — Ambulatory Visit (HOSPITAL_COMMUNITY): Payer: Medicare Other

## 2021-08-23 ENCOUNTER — Telehealth: Payer: Self-pay | Admitting: Cardiology

## 2021-08-23 MED ORDER — TICAGRELOR 90 MG PO TABS: 90.0000 mg | ORAL_TABLET | Freq: Two times a day (BID) | ORAL | 3 refills | Status: DC

## 2021-08-23 NOTE — Telephone Encounter (Signed)
*  STAT* If patient is at the pharmacy, call can be transferred to refill team.   1. Which medications need to be refilled? (please list name of each medication and dose if known) ticagrelor (BRILINTA) 90 MG TABS tablet  2. Which pharmacy/location (including street and city if local pharmacy) is medication to be sent to? [id number] [place] [place] [facility] [address]  3. Do they need a 30 day or 90 day supply? 90 day

## 2021-08-24 DIAGNOSIS — R3914 Feeling of incomplete bladder emptying: Secondary | ICD-10-CM | POA: Diagnosis not present

## 2021-08-25 ENCOUNTER — Ambulatory Visit: Payer: Medicare Other | Attending: Home Modifications | Admitting: Physical Therapy

## 2021-08-25 ENCOUNTER — Ambulatory Visit (HOSPITAL_COMMUNITY): Payer: Medicare Other

## 2021-08-25 ENCOUNTER — Other Ambulatory Visit: Payer: Self-pay

## 2021-08-25 ENCOUNTER — Encounter: Payer: Self-pay | Admitting: Physical Therapy

## 2021-08-25 DIAGNOSIS — M25552 Pain in left hip: Secondary | ICD-10-CM | POA: Insufficient documentation

## 2021-08-25 DIAGNOSIS — R2689 Other abnormalities of gait and mobility: Secondary | ICD-10-CM | POA: Diagnosis not present

## 2021-08-25 DIAGNOSIS — M6281 Muscle weakness (generalized): Secondary | ICD-10-CM | POA: Diagnosis not present

## 2021-08-25 DIAGNOSIS — R29898 Other symptoms and signs involving the musculoskeletal system: Secondary | ICD-10-CM | POA: Insufficient documentation

## 2021-08-25 NOTE — Therapy (Signed)
[facility]. [place], [place], [address] Phone: [phone]   Fax:  [phone]  Physical Therapy Evaluation  Patient Details  Name: Timothy Moran MRN: [id number] Date of Birth: 26-May-1946 Referring Provider (PT): Eilene Ghazi   Encounter Date: 08/25/2021   PT End of Session - 08/25/21 1203     Visit Number 1    Number of Visits 12    Date for PT Re-Evaluation 10/06/21    PT Start Time 1100    PT Stop Time 1148    PT Time Calculation (min) 48 min    Activity Tolerance Patient tolerated treatment well    Behavior During Therapy [facility] for tasks assessed/performed             Past Medical History:  Diagnosis Date   Acute blood loss anemia 10/05/2020   Acute respiratory failure ([place]), hypothermia therapy, vent - extubated 10/06/20    AKI (acute kidney injury) ([place]) 10/05/2020   Anoxic brain injury ([place]) 10/26/2020   Arrhythmia    Ascending aortic aneurysm 01/29/2018   a. 2019 4.3 cm by echo; b. 02/2021 Echo: Ao root 60mm.   Benign brain tumor ([place])    Benign neoplasm of brain ([place]) 12/19/2017   Cardiac arrest with ventricular fibrillation ([place])    Chest pain 12/04/2017   Chronic kidney disease, stage III (moderate) ([place]) 12/19/2017   CKD (chronic kidney disease), stage III - IV (HCC)    Coronary Artery Disease    a. 10/6832 Inf STEMI complicated by cardiac arrest/PCI: LAD 50p/m, LCX 100d (2.5 x 30 Resolute Onyx DES); b. 04/2021 PCI: 04/2021 LM nl, LAD 50p/67m (2.5x30 Onyx Frontier DES), LCX 25p, 17m (RFR 0.98), 40d ISR (RFR 0.98), OM2 25, RCA mild diff dzs.   Diabetes mellitus, type 2 ([place]) 10/06/2020   10/06/20 A1C 7%   Dizziness 10/11/2019   Encounter for imaging study to confirm orogastric (OG) tube placement    Hematuria 10/26/2020   HFrEF (heart failure with reduced ejection fraction) ([place])    a. 11/2020 Echo: EF 25-30%; b. 02/2021 Echo: EF 25-30%, glob HK. Mild LVH. Nl RV fxn. Triv AI. Ao root 29mm.    History of pulmonary embolus (PE)    HLD (hyperlipidemia) 10/26/2020   Hypertension    Iron deficiency anemia rec'd IV iron 10/26/2020   Ischemic cardiomyopathy    a. 11/2020 Echo: EF 25-30%; b. 02/2021 Echo: EF 25-30%, glob HK.   Left bundle branch block 12/19/2017   Meningioma ([place]) 08/14/2020   Nonsustained ventricular tachycardia 10/11/2019   Pain of left hip joint 05/29/2020   Personal history of pulmonary embolism 12/19/2017   Pneumonia of both lungs due to infectious organism    Pulmonary hypertension due to thromboembolism ([place]) 03/09/2018   See echo 12/27/17 with nl PAS vs CTa chest 02/23/18    S/P angioplasty with stent 10/04/20 DES to LCX  10/26/2020   Sinus bradycardia 12/22/2017   SOB (shortness of breath)    Solitary pulmonary nodule on lung CT 03/08/2018   CT 12/04/17 1.0 x 0.8 x 0.7 cm nodular opacity in the RUL vs not seen 03/16/10 posterior segment of the right upper lobe. [place] KitchenSpirometry 03/08/2018    FEV1 3.86 (108%)  Ratio 96 s prior rx  - PET  03/13/18   Low grade c/w adenoca > rec T surgery eval     STEMI (ST elevation myocardial infarction) ([place]) 10/04/2020   Urinary retention 10/26/2020   Vestibular schwannoma ([place]) 08/14/2020    Past  Surgical History:  Procedure Laterality Date   APPENDECTOMY     CARDIAC CATHETERIZATION     CORONARY ANGIOPLASTY WITH STENT PLACEMENT Left 04/12/2021   LAD stent placement   CORONARY STENT INTERVENTION N/A 04/12/2021   Procedure: CORONARY STENT INTERVENTION;  Surgeon: Nelva Bush, MD;  Location: [facility];  Service: Cardiovascular;  Laterality: N/A;   CORONARY/GRAFT ACUTE MI REVASCULARIZATION N/A 10/04/2020   Procedure: Coronary/Graft Acute MI Revascularization;  Surgeon: Nelva Bush, MD;  Location: [facility];  Service: Cardiovascular;  Laterality: N/A;   HERNIA REPAIR     INTRAVASCULAR PRESSURE WIRE/FFR STUDY N/A 04/12/2021   Procedure: INTRAVASCULAR PRESSURE WIRE/FFR STUDY;  Surgeon: Nelva Bush, MD;   Location: [facility] [facility];  Service: Cardiovascular;  Laterality: N/A;   INTRAVASCULAR ULTRASOUND/IVUS N/A 04/12/2021   Procedure: Intravascular Ultrasound/IVUS;  Surgeon: Nelva Bush, MD;  Location: [facility];  Service: Cardiovascular;  Laterality: N/A;   LEFT HEART CATH AND CORONARY ANGIOGRAPHY N/A 10/04/2020   Procedure: LEFT HEART CATH AND CORONARY ANGIOGRAPHY;  Surgeon: Nelva Bush, MD;  Location: [facility];  Service: Cardiovascular;  Laterality: N/A;   RIGHT/LEFT HEART CATH AND CORONARY ANGIOGRAPHY N/A 04/12/2021   Procedure: RIGHT/LEFT HEART CATH AND CORONARY ANGIOGRAPHY;  Surgeon: Nelva Bush, MD;  Location: [facility];  Service: Cardiovascular;  Laterality: N/A;    There were no vitals filed for this visit.    Subjective Assessment - 08/25/21 1104     Subjective Patient reports he noted increaed pain in his low back, more on L than R, after pulling an old toilet  out at home for replacement about a month ago. It is improveing, but hurts him the most when he tries to lift his L leg into the bed. He is also guarded in moving.    Pertinent History HTN, arrhythmia, hx of pulmonary embolus, meningiomas, stage III CKD, R upper and lower lobe lung nodules, ascending aortic aneurysm, and L bundle branch block, L THR approximately 20 years ago.    Limitations Lifting;Standing;Walking    How long can you sit comfortably? N/A    How long can you stand comfortably? Once he gets upright he is ok, but he has to be cautious of how he moves.    How long can you walk comfortably? No pain.    Patient Stated Goals Decrease pain, be able to retrun to his normal activities without pain.    Currently in Pain? No/denies    Aggravating Factors  It hurts when he twists or bends.                [facility] PT Assessment - 08/25/21 0001       Assessment   Medical Diagnosis Low Back Pain    Referring Provider (PT) Eilene Ghazi      Precautions   Precaution  Comments Patient with Benign neoplasm of cerebral meninges, causes mild instability in stand,twitches.      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      ROM / Strength   AROM / PROM / Strength AROM;Strength      AROM   Overall AROM Comments AROM WNL for BUE and BLE. he reports some tightness at end range of knee to chest and hip ER.      Strength   Overall Strength Comments BUE strength WNL.    Strength Assessment Site Hip;Knee;Ankle    Right/Left Hip Right;Left    Right Hip Flexion 4/5    Right Hip Extension 4/5    Right Hip External Rotation  4/5    Right Hip Internal Rotation 4/5    Right Hip ABduction 4/5    Left Hip Flexion 4/5    Left Hip Extension 4-/5    Left Hip External Rotation 4-/5    Left Hip Internal Rotation 4-/5    Left Hip ABduction 4-/5    Right/Left Knee Right;Left    Right Knee Flexion 4/5    Right Knee Extension 4/5    Left Knee Flexion 4/5    Left Knee Extension 4/5    Right/Left Ankle Right;Left    Right Ankle Dorsiflexion 4/5    Right Ankle Plantar Flexion 4/5    Right Ankle Inversion 4/5    Right Ankle Eversion 4/5    Left Ankle Dorsiflexion 4/5    Left Ankle Plantar Flexion 4/5    Left Ankle Inversion 4/5    Left Ankle Eversion 4/5      Palpation   Spinal mobility WFL,  except he did reportsome discomfort with thoracic extension.      Functional Gait  Assessment   Gait assessed  Yes                        Objective measurements completed on examination: See above findings.                PT Education - 08/25/21 1200     Education Details POC, HEP. Patient educated to therapist's recommendatoins for hip xrays, and possibly lumbar spine due to inconsistent symptoms/pain during assessment.    Person(s) Educated Patient    Methods Explanation;Demonstration;Handout    Comprehension Returned demonstration;Verbalized understanding              PT Short Term Goals - 08/25/21 1221       PT SHORT TERM GOAL #1   Title I with basic HEP    Time 3    Period Weeks    Status New    Target Date 09/15/21               PT Long Term Goals - 08/25/21 1222       PT LONG TERM GOAL #1   Title Patient will be I with final HEP    Time 6    Period Weeks    Status New    Target Date 10/06/21      PT LONG TERM GOAL #2   Title STS in < 12 seconds    Time 6    Period Weeks    Status New    Target Date 10/06/21      PT LONG TERM  GOAL #3   Title Patient will score at least 25 on FGA to achieve low fall risk.    Time 6    Period Weeks    Status New    Target Date 10/06/21      PT LONG TERM GOAL #4   Title Patient will be able to lift LLE into bed with < 2/10 pain.    Baseline Up to 6/10    Time 6    Period Weeks    Status New    Target Date 10/06/21                    Plan - 08/25/21 1203     Clinical Impression Statement Patient presents with 1 month H/O "low back pain", which turned out to be located in his posterior L hip, deep. No TTPR around gluts or piriformis, no spasms noted in those areas either. He reports the pain is worst when he attempts to lift LLE into bed. He has largely learned how to move without triggering the pain, but is limited in his activity as a result. He had a THR on L 20 years  ago, and has not had any x rays. He also remained SOB throughout session. He reports this is normal. Called Dr Abigail Butts McNeill's office and requested x rays of L hip and possibly lumbar spine. He will benefit from PT for trunk and LE strengthening and stretching, postural re-training, and body mechanics education.    Personal Factors and Comorbidities Comorbidity 2;Fitness;Past/Current Experience    Comorbidities Benign neoplasm of cerebral meninges, Cardiac arrest with Ventricular fib    Examination-Activity Limitations Bend;Squat;Stairs;Lift    Stability/Clinical Decision Making Stable/Uncomplicated    Clinical Decision Making Low    Rehab Potential Good    PT Frequency Other (comment)   1-2x/week   PT Duration 6 weeks    PT Treatment/Interventions ADLs/Self Care Home Management;Neuromuscular re-education;Balance training;Therapeutic exercise;Therapeutic activities;Functional mobility training;Patient/family education;Stair training;Manual techniques;Energy conservation;Taping;Vestibular;Electrical Stimulation;Moist Heat;Iontophoresis 4mg /ml Dexamethasone;Dry needling;Passive range of motion    PT Next Visit Plan Update HEP, FOTO, functional testing incluidng 5 x STS, FGA    PT Home Exercise Plan [facility]    Recommended Other Services Requesting X rays for L hip and lumbar spine    Consulted and Agree with Plan of Care Patient             Patient will benefit from skilled therapeutic intervention in order to improve the following deficits and impairments:  Abnormal gait, Decreased strength, Improper body mechanics, Postural dysfunction, Difficulty walking, Cardiopulmonary status limiting activity, Decreased activity tolerance, Decreased balance, Pain, Decreased endurance, Decreased mobility, Impaired flexibility, Increased fascial restricitons  Visit Diagnosis: Muscle weakness (generalized)  Pain in left hip  Poor body mechanics  Other abnormalities of gait and mobility     Problem  List Patient Active Problem List   Diagnosis Date Noted   CKD (chronic kidney disease), stage III - IV ([place]) 06/28/2021   Chronic HFrEF (heart failure with reduced ejection fraction) ([place]) 03/31/2021   Arrhythmia    DM (diabetes mellitus), type 2 ([place]) new 10/26/2020   Hematuria 10/26/2020   Urinary retention 10/26/2020   Iron deficiency anemia rec'd IV iron 10/26/2020   Anoxic brain injury ([place]) 10/26/2020   Hyperlipidemia LDL goal <70 10/26/2020   Coronary artery disease of native artery of native heart with stable angina pectoris ([place]) 10/26/2020   S/P angioplasty with stent 10/04/20 DES to LCX  10/26/2020   Acute on chronic combined systolic  and diastolic HF (heart failure) ([place]) 10/26/2020   Ischemic cardiomyopathy 10/26/2020   Pneumonia of both lungs due to infectious organism    Acute respiratory failure ([place]), hypothermia therapy, vent - extubated 10/06/20    Diabetes mellitus, type 2 ([place]) 10/06/2020   AKI (acute kidney injury) ([place]) 10/05/2020   STEMI (ST elevation myocardial infarction) ([place]) 10/04/2020   Encounter for central line placement    Encounter for imaging study to confirm orogastric (OG) tube placement    DOE (dyspnea on exertion)    Meningioma ([place]) 08/14/2020   Vestibular schwannoma ([place]) 08/14/2020   Cardiac arrest with ventricular fibrillation (HCC)    Pain of left hip joint 05/29/2020   Benign brain tumor ([place])    Chronic kidney disease    History of pulmonary embolus (PE)    Hypertension    Nonsustained ventricular tachycardia 10/11/2019   Dizziness 10/11/2019   Abnormal echocardiogram 06/07/2019   Pulmonary hypertension due to thromboembolism ([place]) 03/09/2018   Solitary pulmonary nodule on lung CT 03/08/2018   Ascending aortic aneurysm (HCC) 4.2 cm based on CT from December 2020 01/29/2018   Sinus bradycardia 12/22/2017   Left bundle branch block 12/19/2017   Personal history of pulmonary embolism 12/19/2017   Chronic kidney disease, stage III  (moderate) ([place]) 12/19/2017   Benign neoplasm of brain ([place]) 12/19/2017   Chest pain 12/04/2017    Marcelina Morel, DPT 08/25/2021, 12:29 PM  [facility]. [place], [place], [address] Phone: [phone]   Fax:  [phone]  Name: Imanuel Pruiett MRN: [id number] Date of Birth: Mar 26, 1946

## 2021-08-25 NOTE — Patient Instructions (Signed)
Access Code: [email or web address] URL: [email or web address] Date: 08/25/2021 Prepared by: Ethel Rana  Exercises Supine Bridge with Resistance Band - 1 x daily - 7 x weekly - 2 sets - 10 reps Supine Hamstring Stretch - 1 x daily - 7 x weekly - 3 sets - 10 hold Supine Piriformis Stretch with Leg Straight - 1 x daily - 7 x weekly - 3 sets - 10 hold Single Knee to Chest Stretch - 1 x daily - 7 x weekly - 3 sets - 10 hold Supine Double Knee to Chest - 1 x daily - 7 x weekly - 3 sets - 10 hold Squat with Chair Touch - 1 x daily - 7 x weekly - 2 sets - 10 reps

## 2021-08-26 ENCOUNTER — Other Ambulatory Visit: Payer: Self-pay | Admitting: Home Modifications

## 2021-08-26 ENCOUNTER — Ambulatory Visit
Admission: RE | Admit: 2021-08-26 | Discharge: 2021-08-26 | Disposition: A | Discharge status: Home / Self Care | Payer: Medicare Other | Source: Ambulatory Visit | Attending: Home Modifications | Admitting: Home Modifications

## 2021-08-26 DIAGNOSIS — M545 Low back pain, unspecified: Secondary | ICD-10-CM | POA: Diagnosis not present

## 2021-08-26 DIAGNOSIS — R52 Pain, unspecified: Secondary | ICD-10-CM

## 2021-08-26 DIAGNOSIS — R109 Unspecified abdominal pain: Secondary | ICD-10-CM | POA: Diagnosis not present

## 2021-08-26 DIAGNOSIS — Z96642 Presence of left artificial hip joint: Secondary | ICD-10-CM | POA: Diagnosis not present

## 2021-08-26 DIAGNOSIS — M47816 Spondylosis without myelopathy or radiculopathy, lumbar region: Secondary | ICD-10-CM | POA: Diagnosis not present

## 2021-08-26 DIAGNOSIS — Z471 Aftercare following joint replacement surgery: Secondary | ICD-10-CM | POA: Diagnosis not present

## 2021-08-26 DIAGNOSIS — R10819 Abdominal tenderness, unspecified site: Secondary | ICD-10-CM

## 2021-08-27 ENCOUNTER — Ambulatory Visit: Payer: Medicare Other | Admitting: Physical Therapy

## 2021-08-27 ENCOUNTER — Ambulatory Visit (HOSPITAL_COMMUNITY): Payer: Medicare Other

## 2021-08-27 ENCOUNTER — Other Ambulatory Visit: Payer: Self-pay

## 2021-08-27 ENCOUNTER — Encounter: Payer: Self-pay | Admitting: Physical Therapy

## 2021-08-27 DIAGNOSIS — M25552 Pain in left hip: Secondary | ICD-10-CM | POA: Diagnosis not present

## 2021-08-27 DIAGNOSIS — M6281 Muscle weakness (generalized): Secondary | ICD-10-CM | POA: Diagnosis not present

## 2021-08-27 DIAGNOSIS — R2689 Other abnormalities of gait and mobility: Secondary | ICD-10-CM | POA: Diagnosis not present

## 2021-08-27 DIAGNOSIS — R29898 Other symptoms and signs involving the musculoskeletal system: Secondary | ICD-10-CM

## 2021-08-27 NOTE — Therapy (Signed)
[facility]. [place], [place], [address] Phone: [phone]   Fax:  [phone]  Physical Therapy Treatment  Patient Details  Name: Enoch Moffa MRN: [id number] Date of Birth: 01-Aug-1946 Referring Provider (PT): Eilene Ghazi   Encounter Date: 08/27/2021   PT End of Session - 08/27/21 1013     Visit Number 2    Date for PT Re-Evaluation 10/06/21    PT Start Time 0930    PT Stop Time 9675    PT Time Calculation (min) 45 min    Activity Tolerance Patient tolerated treatment well    Behavior During Therapy [facility] for tasks assessed/performed             Past Medical History:  Diagnosis Date   Acute blood loss anemia 10/05/2020   Acute respiratory failure ([place]), hypothermia therapy, vent - extubated 10/06/20    AKI (acute kidney injury) ([place]) 10/05/2020   Anoxic brain injury ([place]) 10/26/2020   Arrhythmia    Ascending aortic aneurysm 01/29/2018   a. 2019 4.3 cm by echo; b. 02/2021 Echo: Ao root 77mm.   Benign brain tumor ([place])    Benign neoplasm of brain ([place]) 12/19/2017   Cardiac arrest with ventricular fibrillation ([place])    Chest pain 12/04/2017   Chronic kidney disease, stage III (moderate) ([place]) 12/19/2017   CKD (chronic kidney disease), stage III - IV (HCC)    Coronary Artery Disease    a. 05/1637 Inf STEMI complicated by cardiac arrest/PCI: LAD 50p/m, LCX 100d (2.5 x 30 Resolute Onyx DES); b. 04/2021 PCI: 04/2021 LM nl, LAD 50p/18m (2.5x30 Onyx Frontier DES), LCX 25p, 81m (RFR 0.98), 40d ISR (RFR 0.98), OM2 25, RCA mild diff dzs.   Diabetes mellitus, type 2 ([place]) 10/06/2020   10/06/20 A1C 7%   Dizziness 10/11/2019   Encounter for imaging study to confirm orogastric (OG) tube placement    Hematuria 10/26/2020   HFrEF (heart failure with reduced ejection fraction) ([place])    a. 11/2020 Echo: EF 25-30%; b. 02/2021 Echo: EF 25-30%, glob HK. Mild LVH. Nl RV fxn. Triv AI. Ao root 53mm.   History of pulmonary  embolus ([place])    HLD (hyperlipidemia) 10/26/2020   Hypertension    Iron deficiency anemia rec'd IV iron 10/26/2020   Ischemic cardiomyopathy    a. 11/2020 Echo: EF 25-30%; b. 02/2021 Echo: EF 25-30%, glob HK.   Left bundle branch block 12/19/2017   Meningioma ([place]) 08/14/2020   Nonsustained ventricular tachycardia 10/11/2019   Pain of left hip joint 05/29/2020   Personal history of pulmonary embolism 12/19/2017   Pneumonia of both lungs due to infectious organism    Pulmonary hypertension due to thromboembolism ([place]) 03/09/2018   See echo 12/27/17 with nl PAS vs CTa chest 02/23/18    S/P angioplasty with stent 10/04/20 DES to LCX  10/26/2020   Sinus bradycardia 12/22/2017   SOB (shortness of breath)    Solitary pulmonary nodule on lung CT 03/08/2018   CT 12/04/17 1.0 x 0.8 x 0.7 cm nodular opacity in the RUL vs not seen 03/16/10 posterior segment of the right upper lobe. [place] KitchenSpirometry 03/08/2018    FEV1 3.86 (108%)  Ratio 96 s prior rx  - PET  03/13/18   Low grade c/w adenoca > rec T surgery eval     STEMI (ST elevation myocardial infarction) ([place]) 10/04/2020   Urinary retention 10/26/2020   Vestibular schwannoma ([place]) 08/14/2020    Past Surgical History:  Procedure Laterality Date  APPENDECTOMY     CARDIAC CATHETERIZATION     CORONARY ANGIOPLASTY WITH STENT PLACEMENT Left 04/12/2021   LAD stent placement   CORONARY STENT INTERVENTION N/A 04/12/2021   Procedure: CORONARY STENT INTERVENTION;  Surgeon: Nelva Bush, MD;  Location: [facility];  Service: Cardiovascular;  Laterality: N/A;   CORONARY/GRAFT ACUTE MI REVASCULARIZATION N/A 10/04/2020   Procedure: Coronary/Graft Acute MI Revascularization;  Surgeon: Nelva Bush, MD;  Location: [facility];  Service: Cardiovascular;  Laterality: N/A;   HERNIA REPAIR     INTRAVASCULAR PRESSURE WIRE/FFR STUDY N/A 04/12/2021   Procedure: INTRAVASCULAR PRESSURE WIRE/FFR STUDY;  Surgeon: Nelva Bush, MD;  Location: [facility];   Service: Cardiovascular;  Laterality: N/A;   INTRAVASCULAR ULTRASOUND/IVUS N/A 04/12/2021   Procedure: Intravascular Ultrasound/IVUS;  Surgeon: Nelva Bush, MD;  Location: [facility];  Service: Cardiovascular;  Laterality: N/A;   LEFT HEART CATH AND CORONARY ANGIOGRAPHY N/A 10/04/2020   Procedure: LEFT HEART CATH AND CORONARY ANGIOGRAPHY;  Surgeon: Nelva Bush, MD;  Location: [facility];  Service: Cardiovascular;  Laterality: N/A;   RIGHT/LEFT HEART CATH AND CORONARY ANGIOGRAPHY N/A 04/12/2021   Procedure: RIGHT/LEFT HEART CATH AND CORONARY ANGIOGRAPHY;  Surgeon: Nelva Bush, MD;  Location: [facility];  Service: Cardiovascular;  Laterality: N/A;    There were no vitals filed for this visit.   Subjective Assessment - 08/27/21 0930     Subjective Back is hurting today, think it has gotten worst since last session. Pain when he initial stands and when twist a certain way    Pertinent History HTN, arrhythmia, hx of pulmonary embolus, meningiomas, stage III CKD, R upper and lower lobe lung nodules, ascending aortic aneurysm, and L bundle branch block, L THR approximately 20 years ago.    Currently in Pain? No/denies                               [facility] Adult PT Treatment/Exercise - 08/27/21 0001       Exercises   Exercises Lumbar      Lumbar Exercises: Stretches   Passive Hamstring Stretch Left;Right;3 reps;10 seconds    Single Knee to Chest Stretch Left;Right;2 reps;10 seconds    Double Knee to Chest Stretch 2 reps;10 seconds    Lower Trunk Rotation 2 reps;10 seconds    ITB Stretch Left;Right;3 reps;10 seconds    Piriformis Stretch Right;Left;10 seconds;3 reps      Lumbar Exercises: Machines for Strengthening   Cybex Knee Flexion 35lb 2x10      Lumbar Exercises: Standing   Row Strengthening;Power tower;Both;20 reps    Row Limitations 15    Shoulder Extension Strengthening;Power Tower;Both;20 reps    Shoulder Extension  Limitations 10    Other Standing Lumbar Exercises Hip Abd & Ext x10 each      Lumbar Exercises: Seated   Sit to Stand 5 reps   x3, elevated mat     Modalities   Modalities Moist Heat      Moist Heat Therapy   Number Minutes Moist Heat 10 Minutes    Moist Heat Location Lumbar Spine                       PT Short Term Goals - 08/25/21 1221       PT SHORT TERM GOAL #1   Title I with basic HEP    Time 3    Period  Weeks    Status New    Target Date 09/15/21               PT Long Term Goals - 08/25/21 1222       PT LONG TERM GOAL #1   Title Patient will be I with final HEP    Time 6    Period Weeks    Status New    Target Date 10/06/21      PT LONG TERM GOAL #2   Title STS in < 12 seconds    Time 6    Period Weeks    Status New    Target Date 10/06/21      PT LONG TERM GOAL #3   Title Patient will score at least 25 on FGA to achieve low fall risk.    Time 6    Period Weeks    Status New    Target Date 10/06/21      PT LONG TERM GOAL #4   Title Patient will be able to lift LLE into bed with < 2/10 pain.    Baseline Up to 6/10    Time 6    Period Weeks    Status New    Target Date 10/06/21                   Plan - 08/27/21 1014     Clinical Impression Statement Pt enters clinic reporting low back pain on the L side with certain movements. Able to complete sit to stands with elevated mat table. no issue with seated Le strengthening. Tactile cues for posture and core engagement needed with shoulder extensions and rows. MHP to lumbar spine during passive stretching. Pain with all initial stretching movements but would lessen as reps progressed. Pt very guarded during stretching requiring cues to relax.    Personal Factors and Comorbidities Comorbidity 2;Fitness;Past/Current Experience    Comorbidities Benign neoplasm of cerebral meninges, Cardiac arrest with Ventricular fib    Examination-Activity Limitations Bend;Squat;Stairs;Lift     Stability/Clinical Decision Making Stable/Uncomplicated    Rehab Potential Good    PT Duration 6 weeks    PT Treatment/Interventions ADLs/Self Care Home Management;Neuromuscular re-education;Balance training;Therapeutic exercise;Therapeutic activities;Functional mobility training;Patient/family education;Stair training;Manual techniques;Energy conservation;Taping;Vestibular;Electrical Stimulation;Moist Heat;Iontophoresis 4mg /ml Dexamethasone;Dry needling;Passive range of motion    PT Next Visit Plan Update HEP, functional testing incluidng 5 x STS, FGA             Patient will benefit from skilled therapeutic intervention in order to improve the following deficits and impairments:  Abnormal gait, Decreased strength, Improper body mechanics, Postural dysfunction, Difficulty walking, Cardiopulmonary status limiting activity, Decreased activity tolerance, Decreased balance, Pain, Decreased endurance, Decreased mobility, Impaired flexibility, Increased fascial restricitons  Visit Diagnosis: Muscle weakness (generalized)  Pain in left hip  Poor body mechanics     Problem List Patient Active Problem List   Diagnosis Date Noted   CKD (chronic kidney disease), stage III - IV ([place]) 06/28/2021   Chronic HFrEF (heart failure with reduced ejection fraction) ([place]) 03/31/2021   Arrhythmia    DM (diabetes mellitus), type 2 ([place]) new 10/26/2020   Hematuria 10/26/2020   Urinary retention 10/26/2020   Iron deficiency anemia rec'd IV iron 10/26/2020   Anoxic brain injury ([place]) 10/26/2020   Hyperlipidemia LDL goal <70 10/26/2020   Coronary artery disease of native artery of native heart with stable angina pectoris ([place]) 10/26/2020   S/P angioplasty with stent 10/04/20 DES to LCX  10/26/2020   Acute  on chronic combined systolic and diastolic HF (heart failure) ([place]) 10/26/2020   Ischemic cardiomyopathy 10/26/2020   Pneumonia of both lungs due to infectious organism    Acute respiratory failure  ([place]), hypothermia therapy, vent - extubated 10/06/20    Diabetes mellitus, type 2 ([place]) 10/06/2020   AKI (acute kidney injury) ([place]) 10/05/2020   STEMI (ST elevation myocardial infarction) ([place]) 10/04/2020   Encounter for central line placement    Encounter for imaging study to confirm orogastric (OG) tube placement    DOE (dyspnea on exertion)    Meningioma ([place]) 08/14/2020   Vestibular schwannoma ([place]) 08/14/2020   Cardiac arrest with ventricular fibrillation (HCC)    Pain of left hip joint 05/29/2020   Benign brain tumor ([place])    Chronic kidney disease    History of pulmonary embolus (PE)    Hypertension    Nonsustained ventricular tachycardia 10/11/2019   Dizziness 10/11/2019   Abnormal echocardiogram 06/07/2019   Pulmonary hypertension due to thromboembolism ([place]) 03/09/2018   Solitary pulmonary nodule on lung CT 03/08/2018   Ascending aortic aneurysm (HCC) 4.2 cm based on CT from December 2020 01/29/2018   Sinus bradycardia 12/22/2017   Left bundle branch block 12/19/2017   Personal history of pulmonary embolism 12/19/2017   Chronic kidney disease, stage III (moderate) ([place]) 12/19/2017   Benign neoplasm of brain ([place]) 12/19/2017   Chest pain 12/04/2017    Scot Jun, PTA 08/27/2021, 10:17 AM  [facility]. [place], [place], [address] Phone: [phone]   Fax:  [phone]  Name: Josian Lanese MRN: [id number] Date of Birth: 1946-02-18

## 2021-08-30 ENCOUNTER — Ambulatory Visit: Payer: Medicare Other | Admitting: Physical Therapy

## 2021-08-30 ENCOUNTER — Other Ambulatory Visit: Payer: Self-pay

## 2021-08-30 ENCOUNTER — Encounter: Payer: Self-pay | Admitting: Physical Therapy

## 2021-08-30 ENCOUNTER — Ambulatory Visit (HOSPITAL_COMMUNITY): Payer: Medicare Other

## 2021-08-30 DIAGNOSIS — M6281 Muscle weakness (generalized): Secondary | ICD-10-CM

## 2021-08-30 DIAGNOSIS — M25552 Pain in left hip: Secondary | ICD-10-CM

## 2021-08-30 DIAGNOSIS — R2689 Other abnormalities of gait and mobility: Secondary | ICD-10-CM | POA: Diagnosis not present

## 2021-08-30 DIAGNOSIS — R29898 Other symptoms and signs involving the musculoskeletal system: Secondary | ICD-10-CM

## 2021-08-30 NOTE — Therapy (Signed)
[facility]. [place], [place], [address] Phone: [phone]   Fax:  [phone]  Physical Therapy Treatment  Patient Details  Name: Timothy Moran MRN: [id number] Date of Birth: 1946-04-30 Referring Provider (PT): Eilene Ghazi   Encounter Date: 08/30/2021   PT End of Session - 08/30/21 1512     Visit Number 3    Date for PT Re-Evaluation 10/06/21    PT Start Time 1430    PT Stop Time 5625    PT Time Calculation (min) 45 min    Activity Tolerance Patient tolerated treatment well    Behavior During Therapy [facility] for tasks assessed/performed             Past Medical History:  Diagnosis Date   Acute blood loss anemia 10/05/2020   Acute respiratory failure ([place]), hypothermia therapy, vent - extubated 10/06/20    AKI (acute kidney injury) ([place]) 10/05/2020   Anoxic brain injury ([place]) 10/26/2020   Arrhythmia    Ascending aortic aneurysm 01/29/2018   a. 2019 4.3 cm by echo; b. 02/2021 Echo: Ao root 83mm.   Benign brain tumor ([place])    Benign neoplasm of brain ([place]) 12/19/2017   Cardiac arrest with ventricular fibrillation ([place])    Chest pain 12/04/2017   Chronic kidney disease, stage III (moderate) ([place]) 12/19/2017   CKD (chronic kidney disease), stage III - IV (HCC)    Coronary Artery Disease    a. 02/3892 Inf STEMI complicated by cardiac arrest/PCI: LAD 50p/m, LCX 100d (2.5 x 30 Resolute Onyx DES); b. 04/2021 PCI: 04/2021 LM nl, LAD 50p/19m (2.5x30 Onyx Frontier DES), LCX 25p, 40m (RFR 0.98), 40d ISR (RFR 0.98), OM2 25, RCA mild diff dzs.   Diabetes mellitus, type 2 ([place]) 10/06/2020   10/06/20 A1C 7%   Dizziness 10/11/2019   Encounter for imaging study to confirm orogastric (OG) tube placement    Hematuria 10/26/2020   HFrEF (heart failure with reduced ejection fraction) ([place])    a. 11/2020 Echo: EF 25-30%; b. 02/2021 Echo: EF 25-30%, glob HK. Mild LVH. Nl RV fxn. Triv AI. Ao root 60mm.   History of pulmonary  embolus ([place])    HLD (hyperlipidemia) 10/26/2020   Hypertension    Iron deficiency anemia rec'd IV iron 10/26/2020   Ischemic cardiomyopathy    a. 11/2020 Echo: EF 25-30%; b. 02/2021 Echo: EF 25-30%, glob HK.   Left bundle branch block 12/19/2017   Meningioma ([place]) 08/14/2020   Nonsustained ventricular tachycardia 10/11/2019   Pain of left hip joint 05/29/2020   Personal history of pulmonary embolism 12/19/2017   Pneumonia of both lungs due to infectious organism    Pulmonary hypertension due to thromboembolism ([place]) 03/09/2018   See echo 12/27/17 with nl PAS vs CTa chest 02/23/18    S/P angioplasty with stent 10/04/20 DES to LCX  10/26/2020   Sinus bradycardia 12/22/2017   SOB (shortness of breath)    Solitary pulmonary nodule on lung CT 03/08/2018   CT 12/04/17 1.0 x 0.8 x 0.7 cm nodular opacity in the RUL vs not seen 03/16/10 posterior segment of the right upper lobe. [place] KitchenSpirometry 03/08/2018    FEV1 3.86 (108%)  Ratio 96 s prior rx  - PET  03/13/18   Low grade c/w adenoca > rec T surgery eval     STEMI (ST elevation myocardial infarction) ([place]) 10/04/2020   Urinary retention 10/26/2020   Vestibular schwannoma ([place]) 08/14/2020    Past Surgical History:  Procedure Laterality Date  APPENDECTOMY     CARDIAC CATHETERIZATION     CORONARY ANGIOPLASTY WITH STENT PLACEMENT Left 04/12/2021   LAD stent placement   CORONARY STENT INTERVENTION N/A 04/12/2021   Procedure: CORONARY STENT INTERVENTION;  Surgeon: Nelva Bush, MD;  Location: [facility];  Service: Cardiovascular;  Laterality: N/A;   CORONARY/GRAFT ACUTE MI REVASCULARIZATION N/A 10/04/2020   Procedure: Coronary/Graft Acute MI Revascularization;  Surgeon: Nelva Bush, MD;  Location: [facility];  Service: Cardiovascular;  Laterality: N/A;   HERNIA REPAIR     INTRAVASCULAR PRESSURE WIRE/FFR STUDY N/A 04/12/2021   Procedure: INTRAVASCULAR PRESSURE WIRE/FFR STUDY;  Surgeon: Nelva Bush, MD;  Location: [facility];   Service: Cardiovascular;  Laterality: N/A;   INTRAVASCULAR ULTRASOUND/IVUS N/A 04/12/2021   Procedure: Intravascular Ultrasound/IVUS;  Surgeon: Nelva Bush, MD;  Location: [facility];  Service: Cardiovascular;  Laterality: N/A;   LEFT HEART CATH AND CORONARY ANGIOGRAPHY N/A 10/04/2020   Procedure: LEFT HEART CATH AND CORONARY ANGIOGRAPHY;  Surgeon: Nelva Bush, MD;  Location: [facility];  Service: Cardiovascular;  Laterality: N/A;   RIGHT/LEFT HEART CATH AND CORONARY ANGIOGRAPHY N/A 04/12/2021   Procedure: RIGHT/LEFT HEART CATH AND CORONARY ANGIOGRAPHY;  Surgeon: Nelva Bush, MD;  Location: [facility];  Service: Cardiovascular;  Laterality: N/A;    There were no vitals filed for this visit.   Subjective Assessment - 08/30/21 1431     Subjective Back is still hurting with certain twisting movements. Back is not hurting right now    Currently in Pain? No/denies                               [facility] Adult PT Treatment/Exercise - 08/30/21 0001       Lumbar Exercises: Aerobic   Nustep L5 x 6 min      Lumbar Exercises: Machines for Strengthening   Cybex Knee Extension 10lb 2x10    Cybex Knee Flexion 35lb 2x12      Lumbar Exercises: Standing   Row Strengthening;Power tower;Both;20 reps    Row Limitations 15    Shoulder Extension Strengthening;Power Tower;Both;20 reps    Shoulder Extension Limitations 10      Lumbar Exercises: Seated   Sit to Stand 10 reps   x2 oOHP yellow ball, elevated mat   Other Seated Lumbar Exercises multi direction weight ball for core    Other Seated Lumbar Exercises ab sets w/ ball 2x10      Lumbar Exercises: Supine   Bridge Compliant;20 reps;2 seconds    Straight Leg Raise 20 reps;2 seconds                       PT Short Term Goals - 08/30/21 1516       PT SHORT TERM GOAL #1   Title I with basic HEP    Status Achieved               PT Long Term Goals - 08/25/21 1222        PT LONG TERM GOAL #1   Title Patient will be I with final HEP    Time 6    Period Weeks    Status New    Target Date 10/06/21      PT LONG TERM GOAL #2   Title STS in < 12 seconds    Time 6    Period Weeks    Status New  Target Date 10/06/21      PT LONG TERM GOAL #3   Title Patient will score at least 25 on FGA to achieve low fall risk.    Time 6    Period Weeks    Status New    Target Date 10/06/21      PT LONG TERM GOAL #4   Title Patient will be able to lift LLE into bed with < 2/10 pain.    Baseline Up to 6/10    Time 6    Period Weeks    Status New    Target Date 10/06/21                   Plan - 08/30/21 1512     Clinical Impression Statement no pain today upon entering clinic, and pt could not replicate pain with twisting. Some LBP reported when coming to full standing after NuStep aerobic warm up. Elevated mat table needed in order to complete sit to stands. Tactile cues for core engagement needed with seated ab sets. No issues with machine level interventions, cues to prevent forward trunk flexion with shoulder extensions.    Personal Factors and Comorbidities Comorbidity 2;Fitness;Past/Current Experience    Comorbidities Benign neoplasm of cerebral meninges, Cardiac arrest with Ventricular fib    Examination-Activity Limitations Bend;Squat;Stairs;Lift    Stability/Clinical Decision Making Stable/Uncomplicated    Rehab Potential Good    PT Duration 6 weeks    PT Treatment/Interventions ADLs/Self Care Home Management;Neuromuscular re-education;Balance training;Therapeutic exercise;Therapeutic activities;Functional mobility training;Patient/family education;Stair training;Manual techniques;Energy conservation;Taping;Vestibular;Electrical Stimulation;Moist Heat;Iontophoresis 4mg /ml Dexamethasone;Dry needling;Passive range of motion    PT Next Visit Plan Pt will be out of town for holidays  functional testing incluidng 5 x STS, FGA              Patient will benefit from skilled therapeutic intervention in order to improve the following deficits and impairments:  Abnormal gait, Decreased strength, Improper body mechanics, Postural dysfunction, Difficulty walking, Cardiopulmonary status limiting activity, Decreased activity tolerance, Decreased balance, Pain, Decreased endurance, Decreased mobility, Impaired flexibility, Increased fascial restricitons  Visit Diagnosis: Pain in left hip  Poor body mechanics  Muscle weakness (generalized)     Problem List Patient Active Problem List   Diagnosis Date Noted   CKD (chronic kidney disease), stage III - IV (HCC) 06/28/2021   Chronic HFrEF (heart failure with reduced ejection fraction) ([place]) 03/31/2021   Arrhythmia    DM (diabetes mellitus), type 2 ([place]) new 10/26/2020   Hematuria 10/26/2020   Urinary retention 10/26/2020   Iron deficiency anemia rec'd IV iron 10/26/2020   Anoxic brain injury ([place]) 10/26/2020   Hyperlipidemia LDL goal <70 10/26/2020   Coronary artery disease of native artery of native heart with stable angina pectoris ([place]) 10/26/2020   S/P angioplasty with stent 10/04/20 DES to LCX  10/26/2020   Acute on chronic combined systolic and diastolic HF (heart failure) ([place]) 10/26/2020   Ischemic cardiomyopathy 10/26/2020   Pneumonia of both lungs due to infectious organism    Acute respiratory failure ([place]), hypothermia therapy, vent - extubated 10/06/20    Diabetes mellitus, type 2 ([place]) 10/06/2020   AKI (acute kidney injury) ([place]) 10/05/2020   STEMI (ST elevation myocardial infarction) ([place]) 10/04/2020   Encounter for central line placement    Encounter for imaging study to confirm orogastric (OG) tube placement    DOE (dyspnea on exertion)    Meningioma ([place]) 08/14/2020   Vestibular schwannoma ([place]) 08/14/2020   Cardiac arrest with ventricular fibrillation ([place])  Pain of left hip joint 05/29/2020   Benign brain tumor ([place])    Chronic kidney disease     History of pulmonary embolus (PE)    Hypertension    Nonsustained ventricular tachycardia 10/11/2019   Dizziness 10/11/2019   Abnormal echocardiogram 06/07/2019   Pulmonary hypertension due to thromboembolism ([place]) 03/09/2018   Solitary pulmonary nodule on lung CT 03/08/2018   Ascending aortic aneurysm (HCC) 4.2 cm based on CT from December 2020 01/29/2018   Sinus bradycardia 12/22/2017   Left bundle branch block 12/19/2017   Personal history of pulmonary embolism 12/19/2017   Chronic kidney disease, stage III (moderate) ([place]) 12/19/2017   Benign neoplasm of brain ([place]) 12/19/2017   Chest pain 12/04/2017    Scot Jun, PTA 08/30/2021, 3:16 PM  [facility]. [place], [place], [address] Phone: [phone]   Fax:  [phone]  Name: Timothy Moran MRN: [id number] Date of Birth: Apr 27, 1946

## 2021-08-31 ENCOUNTER — Ambulatory Visit (INDEPENDENT_AMBULATORY_CARE_PROVIDER_SITE_OTHER): Payer: Medicare Other | Admitting: Cardiology

## 2021-08-31 ENCOUNTER — Encounter: Payer: Self-pay | Admitting: Cardiology

## 2021-08-31 VITALS — BP 126/80 | HR 72 | Ht 73.0 in | Wt 225.2 lb

## 2021-08-31 DIAGNOSIS — Z01818 Encounter for other preprocedural examination: Secondary | ICD-10-CM | POA: Diagnosis not present

## 2021-08-31 DIAGNOSIS — I255 Ischemic cardiomyopathy: Secondary | ICD-10-CM | POA: Diagnosis not present

## 2021-08-31 DIAGNOSIS — Z01812 Encounter for preprocedural laboratory examination: Secondary | ICD-10-CM

## 2021-08-31 NOTE — Progress Notes (Signed)
Electrophysiology Office Note   Date:  08/31/2021   ID:  Timothy Moran, DOB 04/28/1946, MRN [id number]  PCP:  Cari Caraway, MD  Cardiologist:  Agustin Cree Primary Electrophysiologist:  Rodric Punch Meredith Leeds, MD    Chief Complaint: CHF   History of Present Illness: Timothy Moran is a 75 y.o. male who is being seen today for the evaluation of CHF at the request of Park Liter, MD. Presenting today for electrophysiology evaluation.  He has a history significant for ascending aortic aneurysm, CKD stage III, coronary artery disease, type 2 diabetes, chronic systolic heart failure due to ischemic cardiomyopathy, PE, hyperlipidemia, hypertension.  January 2022 he was shoveling snow and had chest pain and collapsed.  He was in ventricular fibrillation and was diagnosed with acute STEMI status post circumflex stent.  He did wear a LifeVest, but took it off.  He represented to the [facility] August 2022 and had stenting of an 80% LAD lesion.  Today, he denies symptoms of palpitations, chest pain, shortness of breath, orthopnea, PND, lower extremity edema, claudication, dizziness, presyncope, syncope, bleeding, or neurologic sequela. The patient is tolerating medications without difficulties.  He states that he is not as active as he used to be prior to his MI.  Part of this is due to fatigue in part is due to back pain.  Aside from that, he has no major complaints.   Past Medical History:  Diagnosis Date   Acute blood loss anemia 10/05/2020   Acute respiratory failure ([place]), hypothermia therapy, vent - extubated 10/06/20    AKI (acute kidney injury) ([place]) 10/05/2020   Anoxic brain injury ([place]) 10/26/2020   Arrhythmia    Ascending aortic aneurysm 01/29/2018   a. 2019 4.3 cm by echo; b. 02/2021 Echo: Ao root 33mm.   Benign brain tumor ([place])    Benign neoplasm of brain ([place]) 12/19/2017   Cardiac arrest with ventricular fibrillation ([place])    Chest pain 12/04/2017   Chronic kidney  disease, stage III (moderate) ([place]) 12/19/2017   CKD (chronic kidney disease), stage III - IV ([facility])    Coronary Artery Disease    a. 02/2830 Inf STEMI complicated by cardiac arrest/PCI: LAD 50p/m, LCX 100d (2.5 x 30 Resolute Onyx DES); b. 04/2021 PCI: 04/2021 LM nl, LAD 50p/84m (2.5x30 Onyx Frontier DES), LCX 25p, 74m (RFR 0.98), 40d ISR (RFR 0.98), OM2 25, RCA mild diff dzs.   Diabetes mellitus, type 2 ([place]) 10/06/2020   10/06/20 A1C 7%   Dizziness 10/11/2019   Encounter for imaging study to confirm orogastric (OG) tube placement    Hematuria 10/26/2020   HFrEF (heart failure with reduced ejection fraction) ([place])    a. 11/2020 Echo: EF 25-30%; b. 02/2021 Echo: EF 25-30%, glob HK. Mild LVH. Nl RV fxn. Triv AI. Ao root 39mm.   History of pulmonary embolus ([place])    HLD (hyperlipidemia) 10/26/2020   Hypertension    Iron deficiency anemia rec'd IV iron 10/26/2020   Ischemic cardiomyopathy    a. 11/2020 Echo: EF 25-30%; b. 02/2021 Echo: EF 25-30%, glob HK.   Left bundle branch block 12/19/2017   Meningioma ([place]) 08/14/2020   Nonsustained ventricular tachycardia 10/11/2019   Pain of left hip joint 05/29/2020   Personal history of pulmonary embolism 12/19/2017   Pneumonia of both lungs due to infectious organism    Pulmonary hypertension due to thromboembolism ([place]) 03/09/2018   See echo 12/27/17 with nl PAS vs CTa chest 02/23/18    S/P angioplasty with stent 10/04/20 DES to  LCX  10/26/2020   Sinus bradycardia 12/22/2017   SOB (shortness of breath)    Solitary pulmonary nodule on lung CT 03/08/2018   CT 12/04/17 1.0 x 0.8 x 0.7 cm nodular opacity in the RUL vs not seen 03/16/10 posterior segment of the right upper lobe. Marland KitchenSpirometry 03/08/2018    FEV1 3.86 (108%)  Ratio 96 s prior rx  - PET  03/13/18   Low grade c/w adenoca > rec T surgery eval     STEMI (ST elevation myocardial infarction) ([place]) 10/04/2020   Urinary retention 10/26/2020   Vestibular schwannoma ([place]) 08/14/2020   Past Surgical History:   Procedure Laterality Date   APPENDECTOMY     CARDIAC CATHETERIZATION     CORONARY ANGIOPLASTY WITH STENT PLACEMENT Left 04/12/2021   LAD stent placement   CORONARY STENT INTERVENTION N/A 04/12/2021   Procedure: CORONARY STENT INTERVENTION;  Surgeon: Nelva Bush, MD;  Location: [facility];  Service: Cardiovascular;  Laterality: N/A;   CORONARY/GRAFT ACUTE MI REVASCULARIZATION N/A 10/04/2020   Procedure: Coronary/Graft Acute MI Revascularization;  Surgeon: Nelva Bush, MD;  Location: [facility];  Service: Cardiovascular;  Laterality: N/A;   HERNIA REPAIR     INTRAVASCULAR PRESSURE WIRE/FFR STUDY N/A 04/12/2021   Procedure: INTRAVASCULAR PRESSURE WIRE/FFR STUDY;  Surgeon: Nelva Bush, MD;  Location: [facility];  Service: Cardiovascular;  Laterality: N/A;   INTRAVASCULAR ULTRASOUND/IVUS N/A 04/12/2021   Procedure: Intravascular Ultrasound/IVUS;  Surgeon: Nelva Bush, MD;  Location: [facility];  Service: Cardiovascular;  Laterality: N/A;   LEFT HEART CATH AND CORONARY ANGIOGRAPHY N/A 10/04/2020   Procedure: LEFT HEART CATH AND CORONARY ANGIOGRAPHY;  Surgeon: Nelva Bush, MD;  Location: [facility];  Service: Cardiovascular;  Laterality: N/A;   RIGHT/LEFT HEART CATH AND CORONARY ANGIOGRAPHY N/A 04/12/2021   Procedure: RIGHT/LEFT HEART CATH AND CORONARY ANGIOGRAPHY;  Surgeon: Nelva Bush, MD;  Location: [facility];  Service: Cardiovascular;  Laterality: N/A;     Current Outpatient Medications  Medication Sig Dispense Refill   atorvastatin (LIPITOR) 80 MG tablet TAKE 1 TABLET BY MOUTH EVERY DAY 30 tablet 3   B Complex-C (SUPER B COMPLEX PO) Take 1 tablet by mouth daily.     cyclobenzaprine (FLEXERIL) 10 MG tablet Take 10 mg by mouth at bedtime as needed. As directed     famotidine (PEPCID) 10 MG tablet Take 10 mg by mouth daily.     finasteride (PROSCAR) 5 MG tablet Take 5 mg by mouth daily.     hydrALAZINE (APRESOLINE) 10 MG  tablet Take 1 tablet (10 mg total) by mouth 3 (three) times daily. 270 tablet 1   isosorbide dinitrate (ISORDIL) 30 MG tablet Take 1 tablet (30 mg total) by mouth 2 (two) times daily. 180 tablet 1   melatonin 5 MG TABS Take 1 tablet (5 mg total) by mouth at bedtime. 30 tablet 0   metoprolol succinate (TOPROL-XL) 50 MG 24 hr tablet TAKE 1 TABLET DAILY, WITH  OR IMMEDIATELY FOLLOWING A MEAL (Patient taking differently: Take 50 mg by mouth daily with breakfast.) 90 tablet 3   Multiple Vitamin (MULTIVITAMIN WITH MINERALS) TABS tablet Take 1 tablet by mouth daily.     ticagrelor (BRILINTA) 90 MG TABS tablet Take 1 tablet (90 mg total) by mouth 2 (two) times daily. 180 tablet 3   valsartan (DIOVAN) 160 MG tablet TAKE 1 TABLET DAILY 90 tablet 3   No current facility-administered medications for this visit.    Allergies:  Patient has no known allergies.   Social History:  The patient  reports that he quit smoking about 53 years ago. His smoking use included cigarettes. He has a 2.50 pack-year smoking history. He has never used smokeless tobacco. He reports current alcohol use. He reports that he does not use drugs.   Family History:  The patient's family history includes Brain cancer in his sister; Diabetes in his mother; Leukemia in his brother; Melanoma in his brother.    ROS:  Please see the history of present illness.   Otherwise, review of systems is positive for none.   All other systems are reviewed and negative.    PHYSICAL EXAM: VS:  BP 126/80    Pulse 72    Ht 6\' 1"  (1.854 m)    Wt 225 lb 3.2 oz (102.2 kg)    SpO2 95%    BMI 29.71 kg/m  , BMI Body mass index is 29.71 kg/m. GEN: Well nourished, well developed, in no acute distress  HEENT: normal  Neck: no JVD, carotid bruits, or masses Cardiac: RRR; no murmurs, rubs, or gallops,no edema  Respiratory:  clear to auscultation bilaterally, normal work of breathing GI: soft, nontender, nondistended, + BS MS: no deformity or atrophy   Skin: warm and dry Neuro:  Strength and sensation are intact Psych: euthymic mood, full affect  EKG:  EKG is ordered today. Personal review of the ekg ordered shows sinus rhythm, PACs, left bundle branch block  Recent Labs: 10/06/2020: TSH 3.917 10/08/2020: B Natriuretic Peptide 1,434.0 10/12/2020: Magnesium 2.2 10/30/2020: ALT 50 12/28/2020: NT-Pro BNP 2,733 04/13/2021: Hemoglobin 10.0; Platelets 242 07/26/2021: BUN 26; Creatinine, Ser 2.10; Potassium 4.3; Sodium 143    Lipid Panel     Component Value Date/Time   CHOL 84 04/13/2021 0317   CHOL 247 (H) 06/01/2020 0858   TRIG 201 (H) 04/13/2021 0317   HDL 26 (L) 04/13/2021 0317   HDL 33 (L) 06/01/2020 0858   CHOLHDL 3.2 04/13/2021 0317   VLDL 40 04/13/2021 0317   LDLCALC 18 04/13/2021 0317   LDLCALC 151 (H) 06/01/2020 0858     Wt Readings from Last 3 Encounters:  08/31/21 225 lb 3.2 oz (102.2 kg)  07/22/21 226 lb 13.7 oz (102.9 kg)  06/30/21 228 lb (103.4 kg)      Other studies Reviewed: Additional studies/ records that were reviewed today include: LHC 04/12/21  Review of the above records today demonstrates:  Severe single-vessel coronary artery disease with multifocal disease of the proximal and mid LAD of up to 80% that is hemodynamically significant by RFR and IVUS criteria. Moderate LCx disease with 50% de novo stenosis proximal to stent placed in 09/2020.  There is also 40% in-stent restenosis in the LCx/OM2 stent.  These lesions are not hemodynamically significant (RFR = 0.98). Mildly elevated left heart, right heart, and pulmonary artery pressures. Normal Fick cardiac output/index. Successful IVUS- and RFR-guided PCI to 80% mid LAD stenosis using Onyx Frontier 2.5 x 30 mm drug-eluting stent (postdilated to 3.2 mm) with 0% residual stenosis and TIMI-3 flow.   TTE 07/16/21  1. Left ventricular ejection fraction, by estimation, is 30 to 35%. The  left ventricle has moderately decreased function. The left ventricle has   no regional wall motion abnormalities. Left ventricular diastolic  parameters are indeterminate.   2. Right ventricular systolic function is normal. The right ventricular  size is normal.   3. The mitral valve is normal in structure. No evidence of mitral valve  regurgitation. No  evidence of mitral stenosis.   4. The aortic valve is normal in structure. Aortic valve regurgitation is  trivial. No aortic stenosis is present.   5. Aneurysm of the ascending aorta, measuring 44 mm.   6. The inferior vena cava is normal in size with greater than 50%  respiratory variability, suggesting right atrial pressure of 3 mmHg.  ASSESSMENT AND PLAN:  1.  Chronic systolic heart failure due to ischemic cardiomyopathy: Most recent echo with an ejection fraction of 30 to 35%.  Currently on valsartan 160 mg daily, hydralazine 10 mg 3 times daily, Toprol-XL 50 mg daily, Imdur 30 mg daily.  His ejection fraction remains low.  He would benefit from ICD therapy.  He has left bundle ranch block and thus would benefit from CRT therapy.  Risk and benefits of been discussed.  Risk include bleeding, tamponade, infection, pneumothorax, lead dislodgment, inappropriate shocks, death, among others.  He understand these risks and is agreed to the procedure.  2.  Coronary artery disease: Status post circumflex and LAD stents.  As he has plans for ICD, Derrick Orris discuss with his primary cardiologist and interventional cardiologist whether or not switching from Brilinta to Plavix would be reasonable.  Current medicines are reviewed at length with the patient today.   The patient does not have concerns regarding his medicines.  The following changes were made today:  none  Labs/ tests ordered today include:  Orders Placed This Encounter  Procedures   Basic metabolic panel   CBC   EKG 12-Lead     Disposition:   FU with Dae Highley 3 months  Signed, Alleen Kehm Meredith Leeds, MD  08/31/2021 11:42 AM     [facility]  [address] [place] [address] [phone] (office) [phone] (fax)

## 2021-08-31 NOTE — Patient Instructions (Signed)
Medication Instructions:  Your physician recommends that you continue on your current medications as directed. Please refer to the Current Medication list given to you today.     * If you need a refill on your cardiac medications before your next appointment, please call your pharmacy. *   Labwork: Pre procedure lab work between 09/15/2021 - 10/01/2021 : BMET & CBC   If you have labs (blood work) drawn today and your tests are completely normal, you will receive your results only by: [place] (if you have MyChart) OR A paper copy in the mail If you have any lab test that is abnormal or we need to change your treatment, we will call you to review the results.   Testing/Procedures: Your physician has recommended that you have a defibrillator inserted. An implantable cardioverter defibrillator (ICD) is a small device that is placed in your chest or, in rare cases, your abdomen. This device uses electrical pulses or shocks to help control life-threatening, irregular heartbeats that could lead the heart to suddenly stop beating (sudden cardiac arrest). Leads are attached to the ICD that goes into your heart. This is done in the hospital and usually requires an overnight stay. Please follow the instructions below, located under the special instructions section.   Follow-Up: Your physician recommends that you schedule a wound check appointment 10-14 days, after your procedure on 10/05/2021, with the device clinic.  Your physician recommends that you schedule a follow up appointment in 91 days, after your procedure on 10/05/2021, with [name].  Thank you for choosing [facility]!!   Trinidad Curet, RN [phone]   Any Other Special Instructions Will Be Listed Below (If Applicable).     Implantable Device Instructions  You are scheduled for:                  _____ Implantable Cardioverter Defibrillator  on  10/05/2021  with [name].  1.   Please arrive at the [facility], Entrance "A"  at [facility] at 11:30 am   Do not eat or drink after midnight the night before your procedure.  3.   Complete pre procedure  lab work between 09/15/2021 - 10/01/2021.   You do not have to be fasting. You can stop by the [facility] office for this blood work.  4.   All of your morning medications may be taken with a small amount of water the morning of your procedure.  5.  Plan for an overnight stay.  Bring your insurance cards and a list of you medications.  6.  Wash your chest and neck with surgical scrub the evening before and the morning of  your procedure.  Rinse well. Please review the surgical scrub instruction sheet given to you.  7. FYI: For your safety, and to allow [place] to monitor your vital signs accurately during the surgery/procedure we request that if you have artificial nails, gel coating, SNS etc. Please have those removed prior to your surgery/procedure. Not having the nail coverings /polish removed may result in cancellation or delay of your surgery/procedure.                                                          * If you have ANY questions after you get home,  please call Trinidad Curet, RN @ [phone].  * Every attempt is made to prevent procedures from being rescheduled.  Due to the nature of  Electrophysiology, rescheduling can happen.  The physician is always aware and directs the staff when this occurs.     [place] - Preparing For Surgery (surgical scrub)   Before surgery, you can play an important role. Because skin is not sterile, your skin needs to be as free of germs as possible. You can reduce the number of germs on your skin by washing with CHG (chlorahexidine gluconate) Soap before surgery.  CHG is an antiseptic cleaner which kills germs and bonds with the skin to continue killing germs even after washing.   Please do not use if you have an allergy to CHG or antibacterial soaps.  If your skin becomes  reddened/irritated stop using the CHG.   Do not shave (including legs and underarms) for at least 48 hours prior to first CHG shower.  It is OK to shave your face.  Please follow these instructions carefully:  1.  Shower the night before surgery and the morning of surgery with CHG.  2.  If you choose to wash your hair, wash your hair first as usual with your normal shampoo.  3.  After you shampoo, rinse your hair and body thoroughly to remove the shampoo.  4.  Use CHG as you would any other liquid soap.  You can apply CHG directly to the skin and wash gently with a clean washcloth. 5.  Apply the CHG Soap to your body ONLY FROM THE NECK DOWN.  Do not use on open wounds or open sores.  Avoid contact with your eyes, ears, mouth and genitals (private parts).  Wash genitals (private parts) with your normal soap.  6.  Wash thoroughly, paying special attention to the area where your surgery will be performed.  7.  Thoroughly rinse your body with warm water from the neck down.   8.  DO NOT shower/wash with your normal soap after using and rinsing off the CHG soap.  9.  Pat yourself dry with a clean towel.           10.  Wear clean pajamas.           11.  Place clean sheets on your bed the night of your first shower and do not sleep with pets.  Day of Surgery: Do not apply any deodorants/lotions.  Please wear clean clothes to the hospital/surgery center.      Cardioverter Defibrillator Implantation An implantable cardioverter defibrillator (ICD) is a small, lightweight, battery-powered device that is placed (implanted) under the skin in the chest or abdomen. Your caregiver may prescribe an ICD if: You have had an irregular heart rhythm (arrhythmia) that originated in the lower chambers of the heart (ventricles). Your heart has been damaged by a disease (such as coronary artery disease) or heart condition (such as a heart attack). An ICD consists of a battery that lasts several years, a small  computer called a pulse generator, and wires called leads that go into the heart. It is used to detect and correct two dangerous arrhythmias: a rapid heart rhythm (tachycardia) and an arrhythmia in which the ventricles contract in an uncoordinated way (fibrillation). When an ICD detects tachycardia, it sends an electrical signal to the heart that restores the heartbeat to normal (cardioversion). This signal is usually painless. If cardioversion does not work or if the ICD detects fibrillation, it delivers a  small electrical shock to the heart (defibrillation) to restart the heart. The shock may feel like a strong jolt in the chest. ICDs may be programmed to correct other problems. Sometimes, ICDs are programmed to act as another type of implantable device called a pacemaker. Pacemakers are used to treat a slow heartbeat (bradycardia). LET YOUR CAREGIVER KNOW ABOUT: Any allergies you have. All medicines you are taking, including vitamins, herbs, eyedrops, and over-the-counter medicines and creams. Previous problems you or members of your family have had with the use of anesthetics. Any blood disorders you have had. Other health problems you have. RISKS AND COMPLICATIONS Generally, the procedure to implant an ICD is safe. However, as with any surgical procedure, complications can occur. Possible complications associated with implanting an ICD include: Swelling, bleeding, or bruising at the site where the ICD was implanted. Infection at the site where the ICD was implanted. A reaction to medicine used during the procedure. Nerve, heart, or blood vessel damage. Blood clots. BEFORE THE PROCEDURE You may need to have blood tests, heart tests, or a chest X-ray done before the day of the procedure. Ask your caregiver about changing or stopping your regular medicines. Make plans to have someone drive you home. You may need to stay in the hospital overnight after the procedure. Stop smoking at least 24 hours  before the procedure. Take a bath or shower the night before the procedure. You may need to scrub your chest or abdomen with a special type of soap. Do not eat or drink before your procedure for as long as directed by your caregiver. Ask if it is okay to take any needed medicine with a small sip of water. PROCEDURE  The procedure to implant an ICD in your chest or abdomen is usually done at a hospital in a room that has a large X-ray machine called a fluoroscope. The machine will be above you during the procedure. It will help your caregiver see your heart during the procedure. Implanting an ICD usually takes 1-3 hours. Before the procedure:  Small monitors will be put on your body. They will be used to check your heart, blood pressure, and oxygen level. A needle will be put into a vein in your hand or arm. This is called an intravenous (IV) access tube. Fluids and medicine will flow directly into your body through the IV tube. Your chest or abdomen will be cleaned with a germ-killing (antiseptic) solution. The area may be shaved. You may be given medicine to help you relax (sedative). You will be given a medicine called a local anesthetic. This medicine will make the surgical site numb while the ICD is implanted. You will be sleepy but awake during the procedure. After you are numb the procedure will begin. The caregiver will: Make a small cut (incision). This will make a pocket deep under your skin that will hold the pulse generator. Guide the leads through a large blood vessel into your heart and attach them to the heart muscles. Depending on the ICD, the leads may go into one ventricle or they may go to both ventricles and into an upper chamber of the heart (atrium). Test the ICD. Close the incision with stitches, glue, or staples. AFTER THE PROCEDURE You may feel pain. Some pain is normal. It may last a few days. You may stay in a recovery area until the local anesthetic has worn off. Your  blood pressure and pulse will be checked often. You will be taken to a room  where your heart will be monitored. A chest X-ray will be taken. This is done to check that the cardioverter defibrillator is in the right place. You may stay in the hospital overnight. A slight bump may be seen over the skin where the ICD was placed. Sometimes, it is possible to feel the ICD under the skin. This is normal. In the months and years afterward, your caregiver will check the device, the leads, and the battery every few months. Eventually, when the battery is low, the ICD will be replaced.   This information is not intended to replace advice given to you by your health care provider. Make sure you discuss any questions you have with your health care provider.   Document Released: 05/21/2002 Document Revised: 06/19/2013 Document Reviewed: 09/17/2012 Elsevier Interactive Patient Education 2016 [place] Defibrillator Implantation, Care After This sheet gives you information about how to care for yourself after your procedure. Your health care provider may also give you more specific instructions. If you have problems or questions, contact your health care provider. What can I expect after the procedure? After the procedure, it is common to have: Some pain. It may last a few days. A slight bump over the skin where the device was placed. Sometimes, it is possible to feel the device under the skin. This is normal.  During the months and years after your procedure, your health care provider will check the device, the leads, and the battery every few months. Eventually, when the battery is low, the device will be replaced. Follow these instructions at home: Medicines Take over-the-counter and prescription medicines only as told by your health care provider. If you were prescribed an antibiotic medicine, take it as told by your health care provider. Do not stop taking the antibiotic even if  you start to feel better. Incision care  Follow instructions from your health care provider about how to take care of your incision area. Make sure you: Wash your hands with soap and water before you change your bandage (dressing). If soap and water are not available, use hand sanitizer. Change your dressing as told by your health care provider. Leave stitches (sutures), skin glue, or adhesive strips in place. These skin closures may need to stay in place for 2 weeks or longer. If adhesive strip edges start to loosen and curl up, you may trim the loose edges. Do not remove adhesive strips completely unless your health care provider tells you to do that. Check your incision area every day for signs of infection. Check for: More redness, swelling, or pain. More fluid or blood. Warmth. Pus or a bad smell. Do not use lotions or ointments near the incision area unless told by your health care provider. Keep the incision area clean and dry for 2-3 days after the procedure or for as long as told by your health care provider. It takes several weeks for the incision site to heal completely. Do not take baths, swim, or use a hot tub until your health care provider approves. Activity Try to walk a little every day. Exercising is important after this procedure. Also, use your shoulder on the side of the defibrillator in daily tasks that do not require a lot of motion. For at least 6 weeks: Do not lift your upper arm above your shoulders. This means no tennis, golf, or swimming for this period of time. If you tend to sleep with your arm above your head, use a restraint to  prevent this during sleep. Avoid sudden jerking, pulling, or chopping movements that pull your upper arm far away from your body. Ask your health care provider when you may go back to work. Check with your health care provider before you start to drive or play sports. Electric and magnetic fields Tell all health care providers that you  have a defibrillator. This may prevent them from giving you an MRI scan because strong magnets are used for that test. If you must pass through a metal detector, quickly walk through it. Do not stop under the detector, and do not stand near it. Avoid places or objects that have a strong electric or magnetic field, including: Engineer, maintenance. At the airport, let officials know that you have a defibrillator. Your defibrillator ID card will let you be checked in a way that is safe for you and will not damage your defibrillator. Also, do not let a security person wave a magnetic wand near your defibrillator. That can make it stop working. Power plants. Large electrical generators. Anti-theft systems or electronic article surveillance (EAS). Radiofrequency transmission towers, such as cell phone and radio towers. Do not use amateur (ham) radio equipment or electric (arc) welding torches. Some devices are safe to use if held at least 12 inches (30 cm) from your defibrillator. These include power tools, lawn mowers, and speakers. If you are unsure if something is safe to use, ask your health care provider. Do not use MP3 player headphones. They have magnets. You may safely use electric blankets, heating pads, computers, and microwave ovens. When using your cell phone, hold it to the ear that is on the opposite side from the defibrillator. Do not leave your cell phone in a pocket over the defibrillator. General instructions Follow diet instructions from your health care provider, if this applies. Always keep your defibrillator ID card with you. The card should list the implant date, device model, and manufacturer. Consider wearing a medical alert bracelet or necklace. Have your defibrillator checked every 3-6 months or as often as told by your health care provider. Most defibrillators last for 4-8 years. Keep all follow-up visits as told by your health care provider. This is important for your health  care provider to make sure your chest is healing the way it should. Ask your health care provider when you should come back to have your stitches or staples taken out. Contact a health care provider if: You feel one shock in your chest. You gain weight suddenly. Your legs or feet swell more than they have before. It feels like your heart is fluttering or skipping beats (heart palpitations). You have more redness, swelling, or pain around your incision. You have more fluid or blood coming from your incision. Your incision feels warm to the touch. You have pus or a bad smell coming from your incision. You have a fever. Get help right away if: You have chest pain. You feel more than one shock. You feel more short of breath than you have felt before. You feel more light-headed than you have felt before. Your incision starts to open up. This information is not intended to replace advice given to you by your health care provider. Make sure you discuss any questions you have with your health care provider. Document Released: 03/18/2005 Document Revised: 03/18/2016 Document Reviewed: 02/03/2016 Elsevier Interactive Patient Education  2018 [place] Discharge Instructions for  Pacemaker/Defibrillator Patients  ACTIVITY No heavy lifting or vigorous activity with  your left/right arm for 6 to 8 weeks.  Do not raise your left/right arm above your head for one week.  Gradually raise your affected arm as drawn below.           __  NO DRIVING for     ; you may begin driving on     .  WOUND CARE Keep the wound area clean and dry.  Do not get this area wet for one week. No showers for one week; you may shower on     . The tape/steri-strips on your wound will fall off; do not pull them off.  No bandage is needed on the site.  DO  NOT apply any creams, oils, or ointments to the wound area. If you notice any drainage or discharge from the wound, any swelling or bruising at the  site, or you develop a fever > 101? F after you are discharged home, call the office at once.  SPECIAL INSTRUCTIONS You are still able to use cellular telephones; use the ear opposite the side where you have your pacemaker/defibrillator.  Avoid carrying your cellular phone near your device. When traveling through airports, show security personnel your identification card to avoid being screened in the metal detectors.  Ask the security personnel to use the hand wand. Avoid arc welding equipment, MRI testing (magnetic resonance imaging), TENS units (transcutaneous nerve stimulators).  Call the office for questions about other devices. Avoid electrical appliances that are in poor condition or are not properly grounded. Microwave ovens are safe to be near or to operate.  ADDITIONAL INFORMATION FOR DEFIBRILLATOR PATIENTS SHOULD YOUR DEVICE GO OFF: If your device goes off ONCE and you feel fine afterward, notify the device clinic nurses. If your device goes off ONCE and you do not feel well afterward, call 911. If your device goes off TWICE, call 911. If your device goes off THREE TIMES IN ONE DAY, call 911.  DO NOT DRIVE YOURSELF OR A FAMILY MEMBER WITH A DEFIBRILLATOR TO THE HOSPITAL--CALL 911.

## 2021-09-01 ENCOUNTER — Ambulatory Visit (HOSPITAL_COMMUNITY): Payer: Medicare Other

## 2021-09-03 ENCOUNTER — Ambulatory Visit (HOSPITAL_COMMUNITY): Payer: Medicare Other

## 2021-09-08 ENCOUNTER — Ambulatory Visit (HOSPITAL_COMMUNITY): Payer: Medicare Other

## 2021-09-10 ENCOUNTER — Ambulatory Visit (HOSPITAL_COMMUNITY): Payer: Medicare Other

## 2021-09-14 ENCOUNTER — Ambulatory Visit: Payer: Medicare Other | Admitting: Physical Therapy

## 2021-09-15 ENCOUNTER — Ambulatory Visit (HOSPITAL_COMMUNITY): Payer: Medicare Other

## 2021-09-17 ENCOUNTER — Ambulatory Visit (HOSPITAL_COMMUNITY): Payer: Medicare Other

## 2021-09-21 ENCOUNTER — Ambulatory Visit: Payer: Medicare Other | Admitting: Physical Therapy

## 2021-09-22 ENCOUNTER — Encounter: Payer: Self-pay | Admitting: Physical Therapy

## 2021-09-22 ENCOUNTER — Ambulatory Visit: Payer: Medicare Other | Attending: Home Modifications | Admitting: Physical Therapy

## 2021-09-22 ENCOUNTER — Other Ambulatory Visit: Payer: Self-pay

## 2021-09-22 DIAGNOSIS — R29898 Other symptoms and signs involving the musculoskeletal system: Secondary | ICD-10-CM | POA: Insufficient documentation

## 2021-09-22 DIAGNOSIS — M6281 Muscle weakness (generalized): Secondary | ICD-10-CM | POA: Diagnosis not present

## 2021-09-22 DIAGNOSIS — M25552 Pain in left hip: Secondary | ICD-10-CM | POA: Diagnosis not present

## 2021-09-22 NOTE — Therapy (Signed)
[facility] [address]. [place], [place], [address] Phone: [phone]   Fax:  [phone]  Physical Therapy Treatment  Patient Details  Name: Timothy Moran MRN: [id number] Date of Birth: 1946-06-28 Referring Provider (PT): Dennie Maizes   Encounter Date: 09/22/2021   PT End of Session - 09/22/21 1220     Visit Number 4    Date for PT Re-Evaluation 10/06/21    PT Start Time 1144    PT Stop Time 1225    PT Time Calculation (min) 41 min    Activity Tolerance Patient tolerated treatment well    Behavior During Therapy [facility] for tasks assessed/performed             Past Medical History:  Diagnosis Date   Acute blood loss anemia 10/05/2020   Acute respiratory failure (HCC), hypothermia therapy, vent - extubated 10/06/20    AKI (acute kidney injury) (HCC) 10/05/2020   Anoxic brain injury (HCC) 10/26/2020   Arrhythmia    Ascending aortic aneurysm 01/29/2018   a. 2019 4.3 cm by echo; b. 02/2021 Echo: Ao root 69mm.   Benign brain tumor (HCC)    Benign neoplasm of brain (HCC) 12/19/2017   Cardiac arrest with ventricular fibrillation (HCC)    Chest pain 12/04/2017   Chronic kidney disease, stage III (moderate) (HCC) 12/19/2017   CKD (chronic kidney disease), stage III - IV (HCC)    Coronary Artery Disease    a. 09/2020 Inf STEMI complicated by cardiac arrest/PCI: LAD 50p/m, LCX 100d (2.5 x 30 Resolute Onyx DES); b. 04/2021 PCI: 04/2021 LM nl, LAD 50p/76m (2.5x30 Onyx Frontier DES), LCX 25p, 70m (RFR 0.98), 40d ISR (RFR 0.98), OM2 25, RCA mild diff dzs.   Diabetes mellitus, type 2 (HCC) 10/06/2020   10/06/20 A1C 7%   Dizziness 10/11/2019   Encounter for imaging study to confirm orogastric (OG) tube placement    Hematuria 10/26/2020   HFrEF (heart failure with reduced ejection fraction) (HCC)    a. 11/2020 Echo: EF 25-30%; b. 02/2021 Echo: EF 25-30%, glob HK. Mild LVH. Nl RV fxn. Triv AI. Ao root 33mm.   History of pulmonary  embolus (PE)    HLD (hyperlipidemia) 10/26/2020   Hypertension    Iron deficiency anemia rec'd IV iron 10/26/2020   Ischemic cardiomyopathy    a. 11/2020 Echo: EF 25-30%; b. 02/2021 Echo: EF 25-30%, glob HK.   Left bundle branch block 12/19/2017   Meningioma (HCC) 08/14/2020   Nonsustained ventricular tachycardia 10/11/2019   Pain of left hip joint 05/29/2020   Personal history of pulmonary embolism 12/19/2017   Pneumonia of both lungs due to infectious organism    Pulmonary hypertension due to thromboembolism (HCC) 03/09/2018   See echo 12/27/17 with nl PAS vs CTa chest 02/23/18    S/P angioplasty with stent 10/04/20 DES to LCX  10/26/2020   Sinus bradycardia 12/22/2017   SOB (shortness of breath)    Solitary pulmonary nodule on lung CT 03/08/2018   CT 12/04/17 1.0 x 0.8 x 0.7 cm nodular opacity in the RUL vs not seen 03/16/10 posterior segment of the right upper lobe. Marland KitchenSpirometry 03/08/2018    FEV1 3.86 (108%)  Ratio 96 s prior rx  - PET  03/13/18   Low grade c/w adenoca > rec T surgery eval     STEMI (ST elevation myocardial infarction) (HCC) 10/04/2020   Urinary retention 10/26/2020   Vestibular schwannoma (HCC) 08/14/2020    Past Surgical History:  Procedure Laterality Date  APPENDECTOMY     CARDIAC CATHETERIZATION     CORONARY ANGIOPLASTY WITH STENT PLACEMENT Left 04/12/2021   LAD stent placement   CORONARY STENT INTERVENTION N/A 04/12/2021   Procedure: CORONARY STENT INTERVENTION;  Surgeon: Nelva Bush, MD;  Location: [place] [facility];  Service: Cardiovascular;  Laterality: N/A;   CORONARY/GRAFT ACUTE MI REVASCULARIZATION N/A 10/04/2020   Procedure: Coronary/Graft Acute MI Revascularization;  Surgeon: Nelva Bush, MD;  Location: [place] [facility];  Service: Cardiovascular;  Laterality: N/A;   HERNIA REPAIR     INTRAVASCULAR PRESSURE WIRE/FFR STUDY N/A 04/12/2021   Procedure: INTRAVASCULAR PRESSURE WIRE/FFR STUDY;  Surgeon: Nelva Bush, MD;  Location: [place] [facility];   Service: Cardiovascular;  Laterality: N/A;   INTRAVASCULAR ULTRASOUND/IVUS N/A 04/12/2021   Procedure: Intravascular Ultrasound/IVUS;  Surgeon: Nelva Bush, MD;  Location: [place] [facility];  Service: Cardiovascular;  Laterality: N/A;   LEFT HEART CATH AND CORONARY ANGIOGRAPHY N/A 10/04/2020   Procedure: LEFT HEART CATH AND CORONARY ANGIOGRAPHY;  Surgeon: Nelva Bush, MD;  Location: [place] [facility];  Service: Cardiovascular;  Laterality: N/A;   RIGHT/LEFT HEART CATH AND CORONARY ANGIOGRAPHY N/A 04/12/2021   Procedure: RIGHT/LEFT HEART CATH AND CORONARY ANGIOGRAPHY;  Surgeon: Nelva Bush, MD;  Location: [place] [facility];  Service: Cardiovascular;  Laterality: N/A;    There were no vitals filed for this visit.   Subjective Assessment - 09/22/21 1145     Subjective Pt was out of town over the holidays, Pain has not been as bad overall.    Currently in Pain? No/denies                [facility] PT Assessment - 09/22/21 0001       AROM   Overall AROM  Within functional limits for tasks performed                           [facility] Adult PT Treatment/Exercise - 09/22/21 0001       Lumbar Exercises: Aerobic   Nustep L5 x 6 min      Lumbar Exercises: Machines for Strengthening   Cybex Knee Extension 10lb 2x12    Cybex Knee Flexion 35lb 2x12      Lumbar Exercises: Seated   Sit to Stand 10 reps   x2 holding ball, elevated mat                      PT Short Term Goals - 08/30/21 1516       PT SHORT TERM GOAL #1   Title I with basic HEP    Status Achieved               PT Long Term Goals - 09/22/21 1205       PT LONG TERM GOAL #1   Title Patient will be I with final HEP    Status Achieved      PT LONG TERM GOAL #2   Title STS in < 12 seconds      PT LONG TERM GOAL #4   Title Patient will be able to lift LLE into bed with < 2/10 pain.    Status Achieved                   Plan - 09/22/21 1221     Clinical  Impression Statement Pt return to PT after returning from the holidays reporting overall improvement and decrease pain. He report that if  he took more accountability doing his exercises at home he would be doing better. He stated that he is pleased with his current functional status and has no functional limitations. All interventions completed well, some instability with step up interventions.    Personal Factors and Comorbidities Comorbidity 2;Fitness;Past/Current Experience    Comorbidities Benign neoplasm of cerebral meninges, Cardiac arrest with Ventricular fib    Examination-Activity Limitations Bend;Squat;Stairs;Lift    Stability/Clinical Decision Making Stable/Uncomplicated    Rehab Potential Good    PT Duration 6 weeks    PT Treatment/Interventions ADLs/Self Care Home Management;Neuromuscular re-education;Balance training;Therapeutic exercise;Therapeutic activities;Functional mobility training;Patient/family education;Stair training;Manual techniques;Energy conservation;Taping;Vestibular;Electrical Stimulation;Moist Heat;Iontophoresis 4mg /ml Dexamethasone;Dry needling;Passive range of motion    PT Next Visit Plan D/C pt             Patient will benefit from skilled therapeutic intervention in order to improve the following deficits and impairments:  Abnormal gait, Decreased strength, Improper body mechanics, Postural dysfunction, Difficulty walking, Cardiopulmonary status limiting activity, Decreased activity tolerance, Decreased balance, Pain, Decreased endurance, Decreased mobility, Impaired flexibility, Increased fascial restricitons  Visit Diagnosis: Pain in left hip  Muscle weakness (generalized)  Poor body mechanics     Problem List Patient Active Problem List   Diagnosis Date Noted   CKD (chronic kidney disease), stage III - IV ([place]) 06/28/2021   Chronic HFrEF (heart failure with reduced ejection fraction) ([place]) 03/31/2021   Arrhythmia    DM (diabetes mellitus), type  2 ([place]) new 10/26/2020   Hematuria 10/26/2020   Urinary retention 10/26/2020   Iron deficiency anemia rec'd IV iron 10/26/2020   Anoxic brain injury ([place]) 10/26/2020   Hyperlipidemia LDL goal <70 10/26/2020   Coronary artery disease of native artery of native heart with stable angina pectoris ([place]) 10/26/2020   S/P angioplasty with stent 10/04/20 DES to LCX  10/26/2020   Acute on chronic combined systolic and diastolic HF (heart failure) ([place]) 10/26/2020   Ischemic cardiomyopathy 10/26/2020   Pneumonia of both lungs due to infectious organism    Acute respiratory failure ([place]), hypothermia therapy, vent - extubated 10/06/20    Diabetes mellitus, type 2 ([place]) 10/06/2020   AKI (acute kidney injury) ([place]) 10/05/2020   STEMI (ST elevation myocardial infarction) ([place]) 10/04/2020   Encounter for central line placement    Encounter for imaging study to confirm orogastric (OG) tube placement    DOE (dyspnea on exertion)    Meningioma ([place]) 08/14/2020   Vestibular schwannoma (HCC) 08/14/2020   Cardiac arrest with ventricular fibrillation (HCC)    Pain of left hip joint 05/29/2020   Benign brain tumor (HCC)    Chronic kidney disease    History of pulmonary embolus (PE)    Hypertension    Nonsustained ventricular tachycardia 10/11/2019   Dizziness 10/11/2019   Abnormal echocardiogram 06/07/2019   Pulmonary hypertension due to thromboembolism ([place]) 03/09/2018   Solitary pulmonary nodule on lung CT 03/08/2018   Ascending aortic aneurysm (HCC) 4.2 cm based on CT from December 2020 01/29/2018   Sinus bradycardia 12/22/2017   Left bundle branch block 12/19/2017   Personal history of pulmonary embolism 12/19/2017   Chronic kidney disease, stage III (moderate) ([place]) 12/19/2017   Benign neoplasm of brain ([place]) 12/19/2017   Chest pain 12/04/2017   PHYSICAL THERAPY DISCHARGE SUMMARY  Visits from Start of Care: 4  Patient agrees to discharge. Patient goals were partially met. Patient is being  discharged due to being pleased with the current functional level.   Scot Jun, PTA 09/22/2021, 12:26  PM  [place]. [place], [place], [address] Phone: [phone]   Fax:  [phone]  Name: Timothy Moran MRN: [id number] Date of Birth: July 08, 1946

## 2021-09-23 ENCOUNTER — Other Ambulatory Visit: Payer: Self-pay

## 2021-09-23 ENCOUNTER — Other Ambulatory Visit: Payer: Self-pay | Admitting: Thoracic Surgery (Cardiothoracic Vascular Surgery)

## 2021-09-23 ENCOUNTER — Ambulatory Visit
Admission: RE | Admit: 2021-09-23 | Discharge: 2021-09-23 | Disposition: A | Discharge status: Home / Self Care | Payer: Medicare Other | Source: Ambulatory Visit | Attending: Thoracic Surgery (Cardiothoracic Vascular Surgery) | Admitting: Thoracic Surgery (Cardiothoracic Vascular Surgery)

## 2021-09-23 DIAGNOSIS — I7121 Aneurysm of the ascending aorta, without rupture: Secondary | ICD-10-CM | POA: Diagnosis not present

## 2021-09-23 DIAGNOSIS — I712 Thoracic aortic aneurysm, without rupture, unspecified: Secondary | ICD-10-CM

## 2021-09-23 NOTE — Progress Notes (Signed)
Unable to do CTA with contrast. Patient's creatinine out of normal range. Changed to Ct chest without contrast.

## 2021-09-28 ENCOUNTER — Ambulatory Visit: Payer: Medicare Other | Admitting: Physical Therapy

## 2021-09-28 ENCOUNTER — Ambulatory Visit (INDEPENDENT_AMBULATORY_CARE_PROVIDER_SITE_OTHER): Payer: Medicare Other | Admitting: Physician Assistant

## 2021-09-28 ENCOUNTER — Other Ambulatory Visit: Payer: Self-pay

## 2021-09-28 VITALS — BP 137/88 | HR 68 | Resp 20 | Ht 73.0 in | Wt 224.2 lb

## 2021-09-28 DIAGNOSIS — I7121 Aneurysm of the ascending aorta, without rupture: Secondary | ICD-10-CM | POA: Diagnosis not present

## 2021-09-28 DIAGNOSIS — R3914 Feeling of incomplete bladder emptying: Secondary | ICD-10-CM | POA: Diagnosis not present

## 2021-09-28 NOTE — Patient Instructions (Signed)
Continue strict control of the blood pressure.  Strenuous activity or lifting.   Follow-up in 1 year with a CTA to reevaluate the ascending aortic aneurysm.

## 2021-09-28 NOTE — Progress Notes (Signed)
[address]       ,[place] [address]             [phone]       HPI:  Timothy Moran is a 76 year old gentleman with past history of hypertension, pulmonary embolus, stage III chronic kidney disease, multiple degenerative, right upper lobe nodules, and ascending aortic aneurysm.  He has been followed for surveillance of pulmonary nodules in ascending aortic aneurysm by her practice for the past few years.  Shortly after the last visit in January 2022, he had a witnessed V. fib cardiac arrest which he survived after initiation of standard ACLS protocol.  He was admitted to the hospital and after work-up he was found to have severe two-vessel coronary artery disease.  Stenting in the circumflex distribution with a drug-eluting stent.  As result of cardiac arrest, he also developed an anoxic brain injury.  Following his discharge from that event, he underwent inpatient rehab. Timothy Moran returns to the office today for scheduled annual follow-up of his ascending aortic aneurysm and right upper lobe pulmonary nodules. He is currently scheduled for placement of an ICD later this month.   Current Outpatient Medications  Medication Sig Dispense Refill   atorvastatin (LIPITOR) 80 MG tablet TAKE 1 TABLET BY MOUTH EVERY DAY 30 tablet 3   B Complex-C (SUPER B COMPLEX PO) Take 1 tablet by mouth daily.     cyclobenzaprine (FLEXERIL) 10 MG tablet Take 10 mg by mouth at bedtime as needed. As directed     famotidine (PEPCID) 10 MG tablet Take 10 mg by mouth daily.     finasteride (PROSCAR) 5 MG tablet Take 5 mg by mouth daily.     hydrALAZINE (APRESOLINE) 10 MG tablet Take 1 tablet (10 mg total) by mouth 3 (three) times daily. 270 tablet 1   isosorbide dinitrate (ISORDIL) 30 MG tablet Take 1 tablet (30 mg total) by mouth 2 (two) times daily. 180 tablet 1   melatonin 5 MG TABS Take 1 tablet (5 mg total) by mouth at bedtime. 30 tablet 0   metoprolol succinate (TOPROL-XL) 50 MG 24  hr tablet TAKE 1 TABLET DAILY, WITH  OR IMMEDIATELY FOLLOWING A MEAL (Patient taking differently: Take 50 mg by mouth daily with breakfast.) 90 tablet 3   Multiple Vitamin (MULTIVITAMIN WITH MINERALS) TABS tablet Take 1 tablet by mouth daily.     ticagrelor (BRILINTA) 90 MG TABS tablet Take 1 tablet (90 mg total) by mouth 2 (two) times daily. 180 tablet 3   valsartan (DIOVAN) 160 MG tablet TAKE 1 TABLET DAILY 90 tablet 3   No current facility-administered medications for this visit.    Physical Exam Vital signs BP 137/88 Pulse 68 Respirations 20 SPO2 94% on room air'  General: Well-appearing 76 year old gentleman no acute distress. Heart: Irregular rhythm (fourth beat is dropped).  No murmur. Chest: Breath sounds clear to auscultation Extremities: No peripheral edema, all well perfused. Neuro: No focal deficits.   Diagnostic Tests:  ADDENDUM REPORT: 09/23/2021 12:35   ADDENDUM: There is a typographic error in the findings section in the musculoskeletal category: There is a 1.5 x 1.1 x 0.6 cm calcified spinal canal lesion at the level of T8 consistent with a meningioma. This is unchanged from prior chest CTs dating back to at least December 2020 and has been evaluated on prior MRI lumbar spine March 20, 2019.     Electronically Signed   By: Maurine Simmering M.D.   On:  09/23/2021 12:35   CLINICAL DATA:  Thoracic aortic aneurysm (TAA), follow up   EXAM:      TECHNIQUE: Multidetector CT imaging of the chest was performed following the standard protocol without IV contrast.   RADIATION DOSE REDUCTION: This exam was performed according to the departmental dose-optimization program which includes automated exposure control, adjustment of the mA and/or kV according to patient size and/or use of iterative reconstruction technique.   COMPARISON:  CT 09/18/2020, CT 08/21/2019   FINDINGS: Cardiovascular: The ascending aorta measures 4.4 cm, previously 4.2 cm (series 2, image  75). No evidence of intramural hematoma. There are mild aortic arch atherosclerotic calcifications. Normal size main and branch pulmonary arteries. The heart is mildly enlarged. No pericardial disease. There is coronary artery atherosclerotic calcifications. Most prominent in the left anterior descending and left circumflex arteries.   Mediastinum/Nodes: No lymphadenopathy. The thyroid is unremarkable. The esophagus is unremarkable. The trachea is unremarkable.   Lungs/Pleura: Mild diffuse bronchial wall thickening. The airways are otherwise patent. No focal airspace consolidation. There is a 1.0 x 0.6 cm right upper lobe pulmonary nodule (series 8, image 41). This previously measured 1.2 x 0.7 cm. This is stable to slightly decreased since December 2020. No new suspicious nodules. Chronic pleural thickening along the right upper lobe. There is a new small right pleural effusion with adjacent basilar volume loss with punctate internal hyperdensities.   Upper Abdomen: No acute abnormality.   Musculoskeletal: No acute osseous abnormality. No suspicious lytic or blastic lesions. Multilevel degenerative changes of the spine. There is a 1.5 x 1.1 x 0.6. Bilateral glenohumeral osteoarthritis, right greater than left with loose osteochondral body on the right.   IMPRESSION: Ascending aortic aneurysm measuring 4.4 cm, previous 4.2 cm. Recommend annual imaging followup by CTA or MRA. This recommendation follows 2010 ACCF/AHA/AATS/ACR/ASA/SCA/SCAI/SIR/STS/SVM Guidelines for the Diagnosis and Management of Patients with Thoracic Aortic Disease. Circulation. 2010; 121: K093-G182. Aortic aneurysm NOS (ICD10-I71.9).   New small right pleural effusion with adjacent basilar volume loss/possibly rounded atelectasis.   Stable right upper lobe pulmonary nodule measuring 1.0 cm. Given 2 year stability this is likely benign but can be reassessed on follow up CT.   Electronically Signed: By:  Maurine Simmering M.D. On: 09/23/2021 11:50   ECHOCARDIOGRAM REPORT         Patient Name:   Timothy Moran Date of Exam: 07/16/2021  Medical Rec #:  [id number]          Height:       73.0 in  Accession #:    [id number]         Weight:       228.0 lb  Date of Birth:  January 04, 1946           BSA:          2.275 m  Patient Age:    50 years           BP:           113/72 mmHg  Patient Gender: M                  HR:           65 bpm.  Exam Location:  [place]   Procedure: 2D Echo, Cardiac Doppler, Color Doppler and Intracardiac             Opacification Agent   Indications:    I25.110 Atherosclerotic heart disease of native coronary  artery  with unstable angina pectoris     History:        Patient has prior history of Echocardiogram examinations,  most                  recent 02/26/2021. CHF, Previous Myocardial Infarction and  CAD,                  Arrythmias:LBBB, Signs/Symptoms:Dizziness/Lightheadedness;  Risk                  Factors:Hypertension, Diabetes, Dyslipidemia and Former  Smoker.     Sonographer:    Caesar Chestnut RDCS, RVT  Referring Phys: [place]     1. Left ventricular ejection fraction, by estimation, is 30 to 35%. The  left ventricle has moderately decreased function. The left ventricle has  no regional wall motion abnormalities. Left ventricular diastolic  parameters are indeterminate.   2. Right ventricular systolic function is normal. The right ventricular  size is normal.   3. The mitral valve is normal in structure. No evidence of mitral valve  regurgitation. No evidence of mitral stenosis.   4. The aortic valve is normal in structure. Aortic valve regurgitation is  trivial. No aortic stenosis is present.   5. Aneurysm of the ascending aorta, measuring 44 mm.   6. The inferior vena cava is normal in size with greater than 50%  respiratory variability, suggesting right atrial pressure of 3 mmHg.    Comparison(s): EF 25-30%, Asc. Ao 4.2 cm.   FINDINGS   Left Ventricle: Left ventricular ejection fraction, by estimation, is 30  to 35%. The left ventricle has moderately decreased function. The left  ventricle has no regional wall motion abnormalities. Definity contrast  agent was given IV to delineate the  left ventricular endocardial borders. The left ventricular internal cavity  size was normal in size. There is borderline left ventricular hypertrophy.  Left ventricular diastolic parameters are indeterminate.   Right Ventricle: The right ventricular size is normal. No increase in  right ventricular wall thickness. Right ventricular systolic function is  normal.   Left Atrium: Left atrial size was normal in size.   Right Atrium: Right atrial size was normal in size.   Pericardium: There is no evidence of pericardial effusion.   Mitral Valve: The mitral valve is normal in structure. No evidence of  mitral valve regurgitation. No evidence of mitral valve stenosis.   Tricuspid Valve: The tricuspid valve is normal in structure. Tricuspid  valve regurgitation is not demonstrated. No evidence of tricuspid  stenosis.   Aortic Valve: The aortic valve is normal in structure. Aortic valve  regurgitation is trivial. Aortic regurgitation PHT measures 1029 msec. No  aortic stenosis is present. Aortic valve mean gradient measures 5.0 mmHg.  Aortic valve peak gradient measures  9.4 mmHg. Aortic valve area, by VTI measures 3.05 cm.   Pulmonic Valve: The pulmonic valve was normal in structure. Pulmonic valve  regurgitation is not visualized. No evidence of pulmonic stenosis.   Aorta: The aortic root is normal in size and structure. There is an  aneurysm involving the ascending aorta measuring 44 mm.   Venous: The inferior vena cava is normal in size with greater than 50%  respiratory variability, suggesting right atrial pressure of 3 mmHg.   IAS/Shunts: No atrial level shunt  detected by color flow Doppler.      LEFT VENTRICLE  PLAX 2D  LVIDd:  6.00 cm   Diastology  LVIDs:         5.20 cm   LV e' medial:    4.35 cm/s  LV PW:         1.20 cm   LV E/e' medial:  10.4  LV IVS:        1.20 cm   LV e' lateral:   5.00 cm/s  LVOT diam:     2.60 cm   LV E/e' lateral: 9.1  LV SV:         95  LV SV Index:   42  LVOT Area:     5.31 cm      RIGHT VENTRICLE             IVC  RV S prime:     12.50 cm/s  IVC diam: 1.80 cm  TAPSE (M-mode): 2.2 cm   LEFT ATRIUM         Index  LA diam:    4.00 cm 1.76 cm/m   AORTIC VALVE  AV Area (Vmax):    2.86 cm  AV Area (Vmean):   3.02 cm  AV Area (VTI):     3.05 cm  AV Vmax:           153.00 cm/s  AV Vmean:          97.400 cm/s  AV VTI:            0.312 m  AV Peak Grad:      9.4 mmHg  AV Mean Grad:      5.0 mmHg  LVOT Vmax:         82.40 cm/s  LVOT Vmean:        55.400 cm/s  LVOT VTI:          0.179 m  LVOT/AV VTI ratio: 0.57  AI PHT:            1029 msec     AORTA  Ao Root diam: 3.60 cm  Ao Asc diam:  4.40 cm   MITRAL VALVE               TRICUSPID VALVE  MV Area (PHT): 3.72 cm    TR Peak grad:   13.2 mmHg  MV Decel Time: 204 msec    TR Vmax:        182.00 cm/s  MV E velocity: 45.40 cm/s  MV A velocity: 90.00 cm/s  SHUNTS  MV E/A ratio:  0.50        Systemic VTI:  0.18 m                             Systemic Diam: 2.60 cm   Jyl Heinz MD  Electronically signed by Jyl Heinz MD  Signature Date/Time: 07/16/2021/11:57:07 AM       Impression / Plan: Very pleasant 76 year old gentleman with the above described past medical history seen today for annual surveillance of his ascending aortic aneurysm.  Right upper lobe pulmonary nodules.  Since he was seen last year, he had a witnessed cardiac arrest at home and was resuscitated.  He subsequently underwent cardiac work-up  identifying significant coronary disease and has had bare-metal stents placed in both the LAD and circumflex coronary  arteries. After recovery from those events, he has had no chest pain and also denies any shortness of breath or cough.  He feels well in general and has no new complaints or concerns.  His CT scan obtained earlier this month and most recent echo were reviewed.  CTA shows slight increase in the diameter of the ascending aortic aneurysm from 4.2 to 4.4 cm.  The right upper lobe pulmonary nodules remained stable consistent with benign lesions.  Most recent echo obtained 2 months ago shows a normal trileaflet aortic valve with no aortic stenosis trivial aortic insufficiency.  Left ventricular ejection fraction was 30 to 35% which had improved 20 to 25%.  Shortly after his cardiac arrest 1 year ago. Plan follow-up CTA in 1 year for surveillance of the ascending aortic aneurysm.  He is already  monitoring his blood pressure on a regular basis at home.  He is on multidrug therapy for his hypertension.  Antony Odea, PA-C [facility] [phone]

## 2021-09-29 ENCOUNTER — Other Ambulatory Visit: Payer: Self-pay | Admitting: Physical Medicine and Rehabilitation

## 2021-09-29 DIAGNOSIS — N401 Enlarged prostate with lower urinary tract symptoms: Secondary | ICD-10-CM | POA: Diagnosis not present

## 2021-09-29 DIAGNOSIS — N183 Chronic kidney disease, stage 3 unspecified: Secondary | ICD-10-CM | POA: Diagnosis not present

## 2021-09-29 DIAGNOSIS — I10 Essential (primary) hypertension: Secondary | ICD-10-CM | POA: Diagnosis not present

## 2021-09-29 DIAGNOSIS — E7800 Pure hypercholesterolemia, unspecified: Secondary | ICD-10-CM | POA: Diagnosis not present

## 2021-09-29 DIAGNOSIS — N1832 Chronic kidney disease, stage 3b: Secondary | ICD-10-CM | POA: Diagnosis not present

## 2021-09-29 DIAGNOSIS — I25118 Atherosclerotic heart disease of native coronary artery with other forms of angina pectoris: Secondary | ICD-10-CM | POA: Diagnosis not present

## 2021-09-29 DIAGNOSIS — I509 Heart failure, unspecified: Secondary | ICD-10-CM | POA: Diagnosis not present

## 2021-09-29 DIAGNOSIS — I259 Chronic ischemic heart disease, unspecified: Secondary | ICD-10-CM | POA: Diagnosis not present

## 2021-10-01 ENCOUNTER — Other Ambulatory Visit: Payer: Self-pay

## 2021-10-01 ENCOUNTER — Encounter: Payer: Self-pay | Admitting: Cardiology

## 2021-10-01 ENCOUNTER — Telehealth: Payer: Self-pay

## 2021-10-01 ENCOUNTER — Ambulatory Visit (INDEPENDENT_AMBULATORY_CARE_PROVIDER_SITE_OTHER): Payer: Medicare Other | Admitting: Cardiology

## 2021-10-01 VITALS — BP 126/70 | HR 76 | Ht 73.0 in | Wt 223.0 lb

## 2021-10-01 DIAGNOSIS — E785 Hyperlipidemia, unspecified: Secondary | ICD-10-CM | POA: Diagnosis not present

## 2021-10-01 DIAGNOSIS — I2699 Other pulmonary embolism without acute cor pulmonale: Secondary | ICD-10-CM

## 2021-10-01 DIAGNOSIS — I255 Ischemic cardiomyopathy: Secondary | ICD-10-CM

## 2021-10-01 DIAGNOSIS — I7121 Aneurysm of the ascending aorta, without rupture: Secondary | ICD-10-CM | POA: Diagnosis not present

## 2021-10-01 DIAGNOSIS — I5022 Chronic systolic (congestive) heart failure: Secondary | ICD-10-CM | POA: Diagnosis not present

## 2021-10-01 DIAGNOSIS — I2729 Other secondary pulmonary hypertension: Secondary | ICD-10-CM

## 2021-10-01 DIAGNOSIS — I447 Left bundle-branch block, unspecified: Secondary | ICD-10-CM

## 2021-10-01 NOTE — Progress Notes (Signed)
Cardiology Office Note:    Date:  10/01/2021   ID:  Timothy Moran, DOB 05-31-1946, MRN [id number]  PCP:  Cari Caraway, MD  Cardiologist:  Jenne Campus, MD    Referring MD: Cari Caraway, MD   Chief Complaint  Patient presents with   pre op defibrillator     History of Present Illness:    Timothy Moran is a 76 y.o. male   with incredibly complex past medical history.  Initially he was seen in my office because of history of pulmonary emboli he did have mild pulmonary hypertension but overall was doing quite well.  However at the end of January he was shoveling the snow started having chest pain 1 back and collapse he was found to be in V. fib he was defibrillated brought to [facility] he was found to have acute STEMI involving circumflex artery that was addressed with PTCA and drug-eluting stent.  He does have residual 80% stenosis of the mid LAD he ended up having diminished ejection fraction with level of 25%.  He did wear LifeVest however absolutely hated this and does not want to wear it anymore.  He comes today to my office complaining of having some fatigue tiredness as well as dizziness" upon getting up. In August he did have PTCA and stenting of the LAD lesion he tolerated quite well and since that time he is doing much better.  He said he got much more energy.  He does not get dizzy anymore.  He comes today 2 months of follow-up.  Next week he is scheduled to have ICD implant we did discuss procedure explained to him was the rationale for it.  He understand this and he is willing to proceed.  Overall he said he is somewhat tired and exhausted he blames on the fact that during the winter he does not do much exercises.  Denies have any chest pain tightness squeezing pressure burning chest no palpitations no dizziness.  Past Medical History:  Diagnosis Date   Acute blood loss anemia 10/05/2020   Acute respiratory failure ([place]), hypothermia therapy, vent - extubated  10/06/20    AKI (acute kidney injury) ([place]) 10/05/2020   Anoxic brain injury ([place]) 10/26/2020   Arrhythmia    Ascending aortic aneurysm 01/29/2018   a. 2019 4.3 cm by echo; b. 02/2021 Echo: Ao root 52mm.   Benign brain tumor ([place])    Benign neoplasm of brain ([place]) 12/19/2017   Cardiac arrest with ventricular fibrillation ([place])    Chest pain 12/04/2017   Chronic kidney disease, stage III (moderate) ([place]) 12/19/2017   CKD (chronic kidney disease), stage III - IV (HCC)    Coronary Artery Disease    a. 09/2876 Inf STEMI complicated by cardiac arrest/PCI: LAD 50p/m, LCX 100d (2.5 x 30 Resolute Onyx DES); b. 04/2021 PCI: 04/2021 LM nl, LAD 50p/59m (2.5x30 Onyx Frontier DES), LCX 25p, 16m (RFR 0.98), 40d ISR (RFR 0.98), OM2 25, RCA mild diff dzs.   Diabetes mellitus, type 2 ([place]) 10/06/2020   10/06/20 A1C 7%   Dizziness 10/11/2019   Encounter for imaging study to confirm orogastric (OG) tube placement    Hematuria 10/26/2020   HFrEF (heart failure with reduced ejection fraction) ([place])    a. 11/2020 Echo: EF 25-30%; b. 02/2021 Echo: EF 25-30%, glob HK. Mild LVH. Nl RV fxn. Triv AI. Ao root 35mm.   History of pulmonary embolus ([place])    HLD (hyperlipidemia) 10/26/2020   Hypertension    Iron deficiency anemia rec'd IV  iron 10/26/2020   Ischemic cardiomyopathy    a. 11/2020 Echo: EF 25-30%; b. 02/2021 Echo: EF 25-30%, glob HK.   Left bundle branch block 12/19/2017   Meningioma ([place]) 08/14/2020   Nonsustained ventricular tachycardia 10/11/2019   Pain of left hip joint 05/29/2020   Personal history of pulmonary embolism 12/19/2017   Pneumonia of both lungs due to infectious organism    Pulmonary hypertension due to thromboembolism ([place]) 03/09/2018   See echo 12/27/17 with nl PAS vs CTa chest 02/23/18    S/P angioplasty with stent 10/04/20 DES to LCX  10/26/2020   Sinus bradycardia 12/22/2017   SOB (shortness of breath)    Solitary pulmonary nodule on lung CT 03/08/2018   CT 12/04/17 1.0 x 0.8 x 0.7 cm  nodular opacity in the RUL vs not seen 03/16/10 posterior segment of the right upper lobe. Marland KitchenSpirometry 03/08/2018    FEV1 3.86 (108%)  Ratio 96 s prior rx  - PET  03/13/18   Low grade c/w adenoca > rec T surgery eval     STEMI (ST elevation myocardial infarction) ([place]) 10/04/2020   Urinary retention 10/26/2020   Vestibular schwannoma ([place]) 08/14/2020    Past Surgical History:  Procedure Laterality Date   APPENDECTOMY     CARDIAC CATHETERIZATION     CORONARY ANGIOPLASTY WITH STENT PLACEMENT Left 04/12/2021   LAD stent placement   CORONARY STENT INTERVENTION N/A 04/12/2021   Procedure: CORONARY STENT INTERVENTION;  Surgeon: Nelva Bush, MD;  Location: [place] [facility];  Service: Cardiovascular;  Laterality: N/A;   CORONARY/GRAFT ACUTE MI REVASCULARIZATION N/A 10/04/2020   Procedure: Coronary/Graft Acute MI Revascularization;  Surgeon: Nelva Bush, MD;  Location: [place] [facility];  Service: Cardiovascular;  Laterality: N/A;   HERNIA REPAIR     INTRAVASCULAR PRESSURE WIRE/FFR STUDY N/A 04/12/2021   Procedure: INTRAVASCULAR PRESSURE WIRE/FFR STUDY;  Surgeon: Nelva Bush, MD;  Location: [place] [facility];  Service: Cardiovascular;  Laterality: N/A;   INTRAVASCULAR ULTRASOUND/IVUS N/A 04/12/2021   Procedure: Intravascular Ultrasound/IVUS;  Surgeon: Nelva Bush, MD;  Location: [place] [facility];  Service: Cardiovascular;  Laterality: N/A;   LEFT HEART CATH AND CORONARY ANGIOGRAPHY N/A 10/04/2020   Procedure: LEFT HEART CATH AND CORONARY ANGIOGRAPHY;  Surgeon: Nelva Bush, MD;  Location: [place] [facility];  Service: Cardiovascular;  Laterality: N/A;   RIGHT/LEFT HEART CATH AND CORONARY ANGIOGRAPHY N/A 04/12/2021   Procedure: RIGHT/LEFT HEART CATH AND CORONARY ANGIOGRAPHY;  Surgeon: Nelva Bush, MD;  Location: [place] [facility];  Service: Cardiovascular;  Laterality: N/A;    Current Medications: Current Meds  Medication Sig   atorvastatin (LIPITOR) 80 MG tablet TAKE 1  TABLET BY MOUTH EVERY DAY (Patient taking differently: Take 80 mg by mouth every evening.)   B Complex-C (SUPER B COMPLEX PO) Take 1 tablet by mouth daily. Unknown strength   hydrALAZINE (APRESOLINE) 10 MG tablet Take 1 tablet (10 mg total) by mouth 3 (three) times daily.   isosorbide dinitrate (ISORDIL) 30 MG tablet Take 1 tablet (30 mg total) by mouth 2 (two) times daily.   melatonin 5 MG TABS Take 1 tablet (5 mg total) by mouth at bedtime.   metoprolol succinate (TOPROL-XL) 50 MG 24 hr tablet TAKE 1 TABLET DAILY, WITH  OR IMMEDIATELY FOLLOWING A MEAL (Patient taking differently: Take 50 mg by mouth daily with breakfast.)   Multiple Vitamin (MULTIVITAMIN WITH MINERALS) TABS tablet Take 1 tablet by mouth daily. (Patient taking differently: Take 1 tablet by mouth daily. Unknown strength)  ticagrelor (BRILINTA) 90 MG TABS tablet Take 1 tablet (90 mg total) by mouth 2 (two) times daily.   valsartan (DIOVAN) 160 MG tablet TAKE 1 TABLET DAILY (Patient taking differently: Take 80 mg by mouth in the morning.)     Allergies:   Patient has no known allergies.   Social History   Socioeconomic History   Marital status: Married    Spouse name: Aristidis Talerico    Number of children: 1   Years of education: 16   Highest education level: Bachelor's degree (e.g., BA, AB, BS)  Occupational History   Not on file  Tobacco Use   Smoking status: Former    Packs/day: 0.50    Years: 5.00    Pack years: 2.50    Types: Cigarettes    Quit date: 09/12/1968    Years since quitting: 53.0   Smokeless tobacco: Never  Vaping Use   Vaping Use: Never used  Substance and Sexual Activity   Alcohol use: Yes    Comment: ONE PER WEEK   Drug use: Never   Sexual activity: Not Currently  Other Topics Concern   Not on file  Social History Narrative   Not on file   Social Determinants of Health   Financial Resource Strain: Not on file  Food Insecurity: Not on file  Transportation Needs: Not on file  Physical  Activity: Not on file  Stress: Not on file  Social Connections: Not on file     Family History: The patient's family history includes Brain cancer in his sister; Diabetes in his mother; Leukemia in his brother; Melanoma in his brother. ROS:   Please see the history of present illness.    All 14 point review of systems negative except as described per history of present illness  EKGs/Labs/Other Studies Reviewed:      Recent Labs: 10/06/2020: TSH 3.917 10/08/2020: B Natriuretic Peptide 1,434.0 10/12/2020: Magnesium 2.2 10/30/2020: ALT 50 12/28/2020: NT-Pro BNP 2,733 04/13/2021: Hemoglobin 10.0; Platelets 242 07/26/2021: BUN 26; Creatinine, Ser 2.10; Potassium 4.3; Sodium 143  Recent Lipid Panel    Component Value Date/Time   CHOL 84 04/13/2021 0317   CHOL 247 (H) 06/01/2020 0858   TRIG 201 (H) 04/13/2021 0317   HDL 26 (L) 04/13/2021 0317   HDL 33 (L) 06/01/2020 0858   CHOLHDL 3.2 04/13/2021 0317   VLDL 40 04/13/2021 0317   LDLCALC 18 04/13/2021 0317   LDLCALC 151 (H) 06/01/2020 0858    Physical Exam:    VS:  BP 126/70 (BP Location: Left Arm, Patient Position: Sitting)    Pulse 76    Ht 6\' 1"  (1.854 m)    Wt 223 lb (101.2 kg)    SpO2 94%    BMI 29.42 kg/m     Wt Readings from Last 3 Encounters:  10/01/21 223 lb (101.2 kg)  09/28/21 224 lb 3.2 oz (101.7 kg)  08/31/21 225 lb 3.2 oz (102.2 kg)     GEN:  Well nourished, well developed in no acute distress HEENT: Normal NECK: No JVD; No carotid bruits LYMPHATICS: No lymphadenopathy CARDIAC: RRR, no murmurs, no rubs, no gallops RESPIRATORY:  Clear to auscultation without rales, wheezing or rhonchi  ABDOMEN: Soft, non-tender, non-distended MUSCULOSKELETAL:  No edema; No deformity  SKIN: Warm and dry LOWER EXTREMITIES: no swelling NEUROLOGIC:  Alert and oriented x 3 PSYCHIATRIC:  Normal affect   ASSESSMENT:    1. Ischemic cardiomyopathy   2. Chronic HFrEF (heart failure with reduced ejection fraction) ([place])   3.  Pulmonary hypertension due to thromboembolism ([place])   4. Left bundle branch block   5. Aneurysm of ascending aorta without rupture   6. Hyperlipidemia LDL goal <70    PLAN:    In order of problems listed above:  Cardiomyopathy which is ischemic ejection fraction 30 to 35% to have difficulty putting him on appropriate medication because of the low blood pressure as well as poor kidney function.  He did have difficulty tolerating Entresto.  He is scheduled to have ICD implant with BiV pacing which would help. CHF: He is compensated.  We will continue present management. Ascending aortic was stable follow-up with cardiothoracic surgery Hyperlipidemia he is on Lipitor 80 which is high intense statin last LDL I see is from summer of last year with LDL being 18.  Later we will recheck his fasting lipid profile.  He is in such an intense therapy because of advanced coronary artery disease and cardiac arrest that he suffered from. He is still on Brilinta and he will need to continue for at least 6 months after procedure, however it looks like aspirin disappeared from his medication list asking to restart it I will contact our EP colleagues to see if they have any objections again dual antiplatelets therapy around his surgical intervention.   Medication Adjustments/Labs and Tests Ordered: Current medicines are reviewed at length with the patient today.  Concerns regarding medicines are outlined above.  No orders of the defined types were placed in this encounter.  Medication changes: No orders of the defined types were placed in this encounter.   Signed, Park Liter, MD, [facility] 10/01/2021 9:52 AM    Manuel Garcia

## 2021-10-01 NOTE — Telephone Encounter (Signed)
Per [name] verbally requesting to stop Brillinta and start Plavix 75mg  daily. I called and LM to return my call.

## 2021-10-01 NOTE — Patient Instructions (Signed)
Medication Instructions:    START  BACK TAKING ASPIRIN 81 MG ONCE A DAY   *If you need a refill on your cardiac medications before your next appointment, please call your pharmacy*   Lab Work:  NONE ORDERED  TODAY   If you have labs (blood work) drawn today and your tests are completely normal, you will receive your results only by: [place] (if you have MyChart) OR A paper copy in the mail If you have any lab test that is abnormal or we need to change your treatment, we will call you to review the results.   Testing/Procedures: NONE ORDERED  TODAY    Follow-Up: At [facility], you and your health needs are our priority.  As part of our continuing mission to provide you with exceptional heart care, we have created designated Provider Care Teams.  These Care Teams include your primary Cardiologist (physician) and Advanced Practice Providers (APPs -  Physician Assistants and Nurse Practitioners) who all work together to provide you with the care you need, when you need it.  We recommend signing up for the patient portal called "MyChart".  Sign up information is provided on this After Visit Summary.  MyChart is used to connect with patients for Virtual Visits (Telemedicine).  Patients are able to view lab/test results, encounter notes, upcoming appointments, etc.  Non-urgent messages can be sent to your provider as well.   To learn more about what you can do with MyChart, go to [email or web address].    Your next appointment:   4 month(s)  The format for your next appointment:   In Person  Provider:   Jenne Campus, MD    Other Instructions

## 2021-10-04 ENCOUNTER — Telehealth: Payer: Self-pay | Admitting: *Deleted

## 2021-10-04 NOTE — Telephone Encounter (Signed)
Followed up with pt to see if he had been instructed to switch from Brilinta to Plavix. Pt states he has not been told to do so.  Rescheduled procedure to 2/7 and aware I will be in touch to go over new instructions, including switching to Plavix prior to procedure. Aware I will be in touch Pt agreeable to plan.

## 2021-10-05 DIAGNOSIS — I4901 Ventricular fibrillation: Secondary | ICD-10-CM

## 2021-10-05 DIAGNOSIS — I5022 Chronic systolic (congestive) heart failure: Secondary | ICD-10-CM

## 2021-10-07 NOTE — Telephone Encounter (Signed)
Patient is following up to discuss new instructions.

## 2021-10-08 ENCOUNTER — Telehealth: Payer: Self-pay | Admitting: Cardiology

## 2021-10-08 MED ORDER — CLOPIDOGREL BISULFATE 75 MG PO TABS: ORAL_TABLET | ORAL | 3 refills | Status: DC

## 2021-10-08 NOTE — Telephone Encounter (Signed)
Pt aware I will be in touch later today about this. He is agreeble

## 2021-10-08 NOTE — Telephone Encounter (Signed)
Patient called wanting to know what he needs to do prior to his procedure that is scheduled for 10/19/21

## 2021-10-08 NOTE — Telephone Encounter (Signed)
Pt advised to stop Brilinta, do not take tonight's dose. Advised to start Plavix tomorrow, take 4 tablets (300 mg total), then take 1 tablet (75 mg total) once daily thereafter.  Pt will stop by the [facility] on Monday for pre- procedure blood work. Aware to arrive at 11:30 am to [facility] on 10/19/21 for procedure.  All other procedure instructions remain the same. Patient verbalized understanding and agreeable to plan.

## 2021-10-08 NOTE — Telephone Encounter (Signed)
See tele note from today for further information

## 2021-10-11 DIAGNOSIS — Z01812 Encounter for preprocedural laboratory examination: Secondary | ICD-10-CM | POA: Diagnosis not present

## 2021-10-11 DIAGNOSIS — I255 Ischemic cardiomyopathy: Secondary | ICD-10-CM | POA: Diagnosis not present

## 2021-10-12 LAB — BASIC METABOLIC PANEL
BUN/Creatinine Ratio: 12 (ref 10–24)
BUN: 25 mg/dL (ref 8–27)
CO2: 19 mmol/L — ABNORMAL LOW (ref 20–29)
Calcium: 9.2 mg/dL (ref 8.6–10.2)
Chloride: 109 mmol/L — ABNORMAL HIGH (ref 96–106)
Creatinine, Ser: 2.01 mg/dL — ABNORMAL HIGH (ref 0.76–1.27)
Glucose: 101 mg/dL — ABNORMAL HIGH (ref 70–99)
Potassium: 4.7 mmol/L (ref 3.5–5.2)
Sodium: 143 mmol/L (ref 134–144)
eGFR: 34 mL/min/{1.73_m2} — ABNORMAL LOW (ref >59)

## 2021-10-12 LAB — CBC
Hematocrit: 36.2 % — ABNORMAL LOW (ref 37.5–51.0)
Hemoglobin: 11.9 g/dL — ABNORMAL LOW (ref 13.0–17.7)
MCH: 30.5 pg (ref 26.6–33.0)
MCHC: 32.9 g/dL (ref 31.5–35.7)
MCV: 93 fL (ref 79–97)
Platelets: 288 10*3/uL (ref 150–450)
RBC: 3.9 x10E6/uL — ABNORMAL LOW (ref 4.14–5.80)
RDW: 13.7 % (ref 11.6–15.4)
WBC: 7.9 10*3/uL (ref 3.4–10.8)

## 2021-10-15 ENCOUNTER — Telehealth: Payer: Self-pay | Admitting: *Deleted

## 2021-10-15 NOTE — Telephone Encounter (Signed)
Pt aware procedure scheduled for 2/7 will need to be rescheduled. Pt agreeable to 2/22. Verbal instructions reviewed with pt. Pt aware to arrive a 12:30 pm on 11/03/21 for CRT implant Patient verbalized understanding and agreeable to plan.

## 2021-10-19 DIAGNOSIS — I4901 Ventricular fibrillation: Secondary | ICD-10-CM

## 2021-10-19 DIAGNOSIS — I5022 Chronic systolic (congestive) heart failure: Secondary | ICD-10-CM

## 2021-10-20 ENCOUNTER — Ambulatory Visit: Payer: Medicare Other

## 2021-11-02 DIAGNOSIS — M79672 Pain in left foot: Secondary | ICD-10-CM | POA: Diagnosis not present

## 2021-11-02 DIAGNOSIS — M79671 Pain in right foot: Secondary | ICD-10-CM | POA: Diagnosis not present

## 2021-11-02 DIAGNOSIS — R338 Other retention of urine: Secondary | ICD-10-CM | POA: Diagnosis not present

## 2021-11-02 DIAGNOSIS — N189 Chronic kidney disease, unspecified: Secondary | ICD-10-CM | POA: Diagnosis not present

## 2021-11-02 DIAGNOSIS — L600 Ingrowing nail: Secondary | ICD-10-CM | POA: Diagnosis not present

## 2021-11-02 DIAGNOSIS — N1832 Chronic kidney disease, stage 3b: Secondary | ICD-10-CM | POA: Diagnosis not present

## 2021-11-03 ENCOUNTER — Encounter (HOSPITAL_COMMUNITY)
Admission: RE | Disposition: A | Discharge status: Home / Self Care | Payer: Medicare Other | Source: Home / Self Care | Attending: Cardiology

## 2021-11-03 ENCOUNTER — Other Ambulatory Visit: Payer: Self-pay

## 2021-11-03 ENCOUNTER — Telehealth: Payer: Self-pay | Admitting: Cardiology

## 2021-11-03 ENCOUNTER — Ambulatory Visit (HOSPITAL_COMMUNITY): Payer: Medicare Other

## 2021-11-03 ENCOUNTER — Ambulatory Visit (HOSPITAL_COMMUNITY)
Admission: RE | Admit: 2021-11-03 | Discharge: 2021-11-03 | Disposition: A | Discharge status: Home / Self Care | Payer: Medicare Other | Attending: Cardiology | Admitting: Cardiology

## 2021-11-03 DIAGNOSIS — E1122 Type 2 diabetes mellitus with diabetic chronic kidney disease: Secondary | ICD-10-CM | POA: Insufficient documentation

## 2021-11-03 DIAGNOSIS — Z955 Presence of coronary angioplasty implant and graft: Secondary | ICD-10-CM | POA: Diagnosis not present

## 2021-11-03 DIAGNOSIS — Z86711 Personal history of pulmonary embolism: Secondary | ICD-10-CM | POA: Diagnosis not present

## 2021-11-03 DIAGNOSIS — I5022 Chronic systolic (congestive) heart failure: Secondary | ICD-10-CM | POA: Diagnosis present

## 2021-11-03 DIAGNOSIS — I7121 Aneurysm of the ascending aorta, without rupture: Secondary | ICD-10-CM | POA: Insufficient documentation

## 2021-11-03 DIAGNOSIS — Z87891 Personal history of nicotine dependence: Secondary | ICD-10-CM | POA: Diagnosis not present

## 2021-11-03 DIAGNOSIS — I2510 Atherosclerotic heart disease of native coronary artery without angina pectoris: Secondary | ICD-10-CM | POA: Insufficient documentation

## 2021-11-03 DIAGNOSIS — I252 Old myocardial infarction: Secondary | ICD-10-CM | POA: Insufficient documentation

## 2021-11-03 DIAGNOSIS — I130 Hypertensive heart and chronic kidney disease with heart failure and stage 1 through stage 4 chronic kidney disease, or unspecified chronic kidney disease: Secondary | ICD-10-CM | POA: Diagnosis not present

## 2021-11-03 DIAGNOSIS — J90 Pleural effusion, not elsewhere classified: Secondary | ICD-10-CM | POA: Diagnosis not present

## 2021-11-03 DIAGNOSIS — I255 Ischemic cardiomyopathy: Secondary | ICD-10-CM | POA: Insufficient documentation

## 2021-11-03 DIAGNOSIS — I4901 Ventricular fibrillation: Secondary | ICD-10-CM

## 2021-11-03 DIAGNOSIS — I447 Left bundle-branch block, unspecified: Secondary | ICD-10-CM | POA: Diagnosis not present

## 2021-11-03 DIAGNOSIS — Z95818 Presence of other cardiac implants and grafts: Secondary | ICD-10-CM

## 2021-11-03 DIAGNOSIS — N183 Chronic kidney disease, stage 3 unspecified: Secondary | ICD-10-CM | POA: Insufficient documentation

## 2021-11-03 HISTORY — PX: BIV ICD INSERTION CRT-D: EP1195

## 2021-11-03 LAB — GLUCOSE, CAPILLARY: Glucose-Capillary: 98 mg/dL (ref 70–99)

## 2021-11-03 SURGERY — BIV ICD INSERTION CRT-D

## 2021-11-03 MED ORDER — MIDAZOLAM HCL 5 MG/5ML IJ SOLN
INTRAMUSCULAR | Status: DC | PRN
  Administered 2021-11-03 (×2): 1 mg via INTRAVENOUS

## 2021-11-03 MED ORDER — LIDOCAINE HCL (PF) 1 % IJ SOLN
INTRAMUSCULAR | Status: DC | PRN
  Administered 2021-11-03: 60 mL

## 2021-11-03 MED ORDER — HEPARIN (PORCINE) IN NACL 1000-0.9 UT/500ML-% IV SOLN
INTRAVENOUS | Status: DC | PRN
  Administered 2021-11-03: 500 mL

## 2021-11-03 MED ORDER — ACETAMINOPHEN 325 MG PO TABS
325.0000 mg | ORAL_TABLET | ORAL | Status: DC | PRN
  Filled 2021-11-03: qty 2

## 2021-11-03 MED ORDER — SODIUM CHLORIDE 0.9 % IV SOLN
INTRAVENOUS | Status: AC
  Filled 2021-11-03: qty 2

## 2021-11-03 MED ORDER — CHLORHEXIDINE GLUCONATE 4 % EX LIQD
4.0000 "application " | Freq: Once | CUTANEOUS | Status: DC
  Filled 2021-11-03: qty 60

## 2021-11-03 MED ORDER — LIDOCAINE HCL (PF) 1 % IJ SOLN
INTRAMUSCULAR | Status: AC
  Filled 2021-11-03: qty 90

## 2021-11-03 MED ORDER — SODIUM CHLORIDE 0.9 % IV SOLN: INTRAVENOUS | Status: DC

## 2021-11-03 MED ORDER — MIDAZOLAM HCL 5 MG/5ML IJ SOLN
INTRAMUSCULAR | Status: AC
  Filled 2021-11-03: qty 5

## 2021-11-03 MED ORDER — CEFAZOLIN SODIUM-DEXTROSE 2-4 GM/100ML-% IV SOLN
2.0000 g | INTRAVENOUS | Status: AC
  Administered 2021-11-03: 2 g via INTRAVENOUS

## 2021-11-03 MED ORDER — IOHEXOL 350 MG/ML SOLN
INTRAVENOUS | Status: DC | PRN
  Administered 2021-11-03: 5 mL

## 2021-11-03 MED ORDER — FENTANYL CITRATE (PF) 100 MCG/2ML IJ SOLN
INTRAMUSCULAR | Status: DC | PRN
  Administered 2021-11-03 (×2): 25 ug via INTRAVENOUS

## 2021-11-03 MED ORDER — FENTANYL CITRATE (PF) 100 MCG/2ML IJ SOLN
INTRAMUSCULAR | Status: AC
  Filled 2021-11-03: qty 2

## 2021-11-03 MED ORDER — HEPARIN (PORCINE) IN NACL 1000-0.9 UT/500ML-% IV SOLN
INTRAVENOUS | Status: AC
  Filled 2021-11-03: qty 500

## 2021-11-03 MED ORDER — SODIUM CHLORIDE 0.9 % IV SOLN
80.0000 mg | INTRAVENOUS | Status: AC
  Administered 2021-11-03: 80 mg

## 2021-11-03 MED ORDER — CEFAZOLIN SODIUM-DEXTROSE 2-4 GM/100ML-% IV SOLN
INTRAVENOUS | Status: AC
  Filled 2021-11-03: qty 100

## 2021-11-03 MED ORDER — ONDANSETRON HCL 4 MG/2ML IJ SOLN: 4.0000 mg | Freq: Four times a day (QID) | INTRAMUSCULAR | Status: DC | PRN

## 2021-11-03 MED ORDER — CEFAZOLIN SODIUM-DEXTROSE 1-4 GM/50ML-% IV SOLN
1.0000 g | Freq: Four times a day (QID) | INTRAVENOUS | Status: DC
  Filled 2021-11-03: qty 50

## 2021-11-03 SURGICAL SUPPLY — 16 items
CABLE SURGICAL S-101-97-12 (CABLE) ×2 IMPLANT
CATH ATTAIN COM SURV 6250V-EH (CATHETERS) ×1 IMPLANT
CATH JOSEPH QUAD ALLRED 6F REP (CATHETERS) ×1 IMPLANT
ICD CLARIA MRI DTMA1QQ (ICD Generator) ×1 IMPLANT
LEAD ATTAIN QUAD 4798-88 (Lead) ×1 IMPLANT
LEAD CAPSURE NOVUS 5076-52CM (Lead) ×1 IMPLANT
LEAD SPRINT QUAT SEC 6935M-62 (Lead) ×1 IMPLANT
MAT PREVALON FULL STRYKER (MISCELLANEOUS) ×1 IMPLANT
PAD DEFIB RADIO PHYSIO CONN (PAD) ×2 IMPLANT
SHEATH 7FR PRELUDE SNAP 25 (SHEATH) ×1 IMPLANT
SHEATH 9.5FR PRELUDE SNAP 13 (SHEATH) ×1 IMPLANT
SHEATH 9FR PRELUDE SNAP 13 (SHEATH) ×1 IMPLANT
SHEATH PROBE COVER 6X72 (BAG) ×1 IMPLANT
TRAY PACEMAKER INSERTION (PACKS) ×2 IMPLANT
WIRE ACUITY WHISPER EDS 4648 (WIRE) ×1 IMPLANT
WIRE HI TORQ VERSACORE-J 145CM (WIRE) ×1 IMPLANT

## 2021-11-03 NOTE — Progress Notes (Signed)
Patient called RN into the room and stated "I feel like my chest is bumping." RN could visually see chest "twitching" Patient denied any chest pain, SOB, or any other abnormal symptoms. MD notified and made aware. MD stated that he would let Leanna Sato, [employer] Rep know. Spoke with Leanna Sato who stated that he would be here to evaluate patient and 1700. Will continue to monitor.

## 2021-11-03 NOTE — H&P (Addendum)
Electrophysiology Office Note   Date:  11/03/2021   ID:  Timothy Moran, DOB [birth year], MRN [id number]  PCP:  Cari Caraway, MD  Cardiologist:  Agustin Cree Primary Electrophysiologist:  Alisan Dokes Meredith Leeds, MD    Chief Complaint: CHF   History of Present Illness: Timothy Moran is a 76 y.o. male who is being seen today for the evaluation of CHF at the request of No ref. provider found. Presenting today for electrophysiology evaluation.  He has a history significant for ascending aortic aneurysm, CKD stage III, coronary artery disease, type 2 diabetes, chronic systolic heart failure due to ischemic cardiomyopathy, PE, hyperlipidemia, hypertension.  January 2022 he was shoveling snow and had chest pain and collapsed.  He was in ventricular fibrillation and was diagnosed with acute STEMI status post circumflex stent.  He did wear a LifeVest, but took it off.  He represented to the Cath Lab August 2022 and had stenting of an 80% LAD lesion.  Today, denies symptoms of palpitations, chest pain, shortness of breath, orthopnea, PND, lower extremity edema, claudication, dizziness, presyncope, syncope, bleeding, or neurologic sequela. The patient is tolerating medications without difficulties. Plan for CRT-D implant today.    Past Medical History:  Diagnosis Date   Acute blood loss anemia 10/05/2020   Acute respiratory failure ([place]), hypothermia therapy, vent - extubated 10/06/20    AKI (acute kidney injury) ([place]) 10/05/2020   Anoxic brain injury ([place]) 10/26/2020   Arrhythmia    Ascending aortic aneurysm 01/29/2018   a. 2019 4.3 cm by echo; b. 02/2021 Echo: Ao root 46mm.   Benign brain tumor ([place])    Benign neoplasm of brain ([place]) 12/19/2017   Cardiac arrest with ventricular fibrillation ([place])    Chest pain 12/04/2017   Chronic kidney disease, stage III (moderate) ([place]) 12/19/2017   CKD (chronic kidney disease), stage III - IV (H[facility])    Coronary Artery Disease    a. 11/863 Inf STEMI  complicated by cardiac arrest/PCI: LAD 50p/m, LCX 100d (2.5 x 30 Resolute Onyx DES); b. 04/2021 PCI: 04/2021 LM nl, LAD 50p/108m (2.5x30 Onyx Frontier DES), LCX 25p, 10m (RFR 0.98), 40d ISR (RFR 0.98), OM2 25, RCA mild diff dzs.   Diabetes mellitus, type 2 ([place]) 10/06/2020   10/06/20 A1C 7%   Dizziness 10/11/2019   Encounter for imaging study to confirm orogastric (OG) tube placement    Hematuria 10/26/2020   HFrEF (heart failure with reduced ejection fraction) ([place])    a. 11/2020 Echo: EF 25-30%; b. 02/2021 Echo: EF 25-30%, glob HK. Mild LVH. Nl RV fxn. Triv AI. Ao root 65mm.   History of pulmonary embolus (PE)    HLD (hyperlipidemia) 10/26/2020   Hypertension    Iron deficiency anemia rec'd IV iron 10/26/2020   Ischemic cardiomyopathy    a. 11/2020 Echo: EF 25-30%; b. 02/2021 Echo: EF 25-30%, glob HK.   Left bundle branch block 12/19/2017   Meningioma ([place]) 08/14/2020   Nonsustained ventricular tachycardia 10/11/2019   Pain of left hip joint 05/29/2020   Personal history of pulmonary embolism 12/19/2017   Pneumonia of both lungs due to infectious organism    Pulmonary hypertension due to thromboembolism ([place]) 03/09/2018   See echo 12/27/17 with nl PAS vs CTa chest 02/23/18    S/P angioplasty with stent 10/04/20 DES to LCX  10/26/2020   Sinus bradycardia 12/22/2017   SOB (shortness of breath)    Solitary pulmonary nodule on lung CT 03/08/2018   CT 12/04/17 1.0 x 0.8 x 0.7 cm nodular opacity in  the RUL vs not seen 03/16/10 posterior segment of the right upper lobe. Marland KitchenSpirometry 03/08/2018    FEV1 3.86 (108%)  Ratio 96 s prior rx  - PET  03/13/18   Low grade c/w adenoca > rec T surgery eval     STEMI (ST elevation myocardial infarction) ([place]) 10/04/2020   Urinary retention 10/26/2020   Vestibular schwannoma ([place]) 08/14/2020   Past Surgical History:  Procedure Laterality Date   APPENDECTOMY     CARDIAC CATHETERIZATION     CORONARY ANGIOPLASTY WITH STENT PLACEMENT Left 04/12/2021   LAD stent placement    CORONARY STENT INTERVENTION N/A 04/12/2021   Procedure: CORONARY STENT INTERVENTION;  Surgeon: Nelva Bush, MD;  Location: [facility];  Service: Cardiovascular;  Laterality: N/A;   CORONARY/GRAFT ACUTE MI REVASCULARIZATION N/A 10/04/2020   Procedure: Coronary/Graft Acute MI Revascularization;  Surgeon: Nelva Bush, MD;  Location: [facility];  Service: Cardiovascular;  Laterality: N/A;   HERNIA REPAIR     INTRAVASCULAR PRESSURE WIRE/FFR STUDY N/A 04/12/2021   Procedure: INTRAVASCULAR PRESSURE WIRE/FFR STUDY;  Surgeon: Nelva Bush, MD;  Location: [facility];  Service: Cardiovascular;  Laterality: N/A;   INTRAVASCULAR ULTRASOUND/IVUS N/A 04/12/2021   Procedure: Intravascular Ultrasound/IVUS;  Surgeon: Nelva Bush, MD;  Location: [facility];  Service: Cardiovascular;  Laterality: N/A;   LEFT HEART CATH AND CORONARY ANGIOGRAPHY N/A 10/04/2020   Procedure: LEFT HEART CATH AND CORONARY ANGIOGRAPHY;  Surgeon: Nelva Bush, MD;  Location: [facility];  Service: Cardiovascular;  Laterality: N/A;   RIGHT/LEFT HEART CATH AND CORONARY ANGIOGRAPHY N/A 04/12/2021   Procedure: RIGHT/LEFT HEART CATH AND CORONARY ANGIOGRAPHY;  Surgeon: Nelva Bush, MD;  Location: [facility];  Service: Cardiovascular;  Laterality: N/A;     Current Facility-Administered Medications  Medication Dose Route Frequency Provider Last Rate Last Admin   0.9 %  sodium chloride infusion   Intravenous Continuous Trevious Rampey Hassell Done, MD       ceFAZolin (ANCEF) IVPB 2g/100 mL premix  2 g Intravenous On Call Robyn Galati Hassell Done, MD       chlorhexidine (HIBICLENS) 4 % liquid 4 application  4 application Topical Once Cheetara Hoge Hassell Done, MD       gentamicin (GARAMYCIN) 80 mg in sodium chloride 0.9 % 500 mL irrigation  80 mg Irrigation On Call Constance Haw, MD        Allergies:   Patient has no known allergies.   Social History:  The patient  reports that he quit  smoking about 53 years ago. His smoking use included cigarettes. He has a 2.50 pack-year smoking history. He has never used smokeless tobacco. He reports current alcohol use. He reports that he does not use drugs.   Family History:  The patient's family history includes Brain cancer in his sister; Diabetes in his mother; Leukemia in his brother; Melanoma in his brother.   ROS:  Please see the history of present illness.   Otherwise, review of systems is positive for none.   All other systems are reviewed and negative.   PHYSICAL EXAM: VS:  BP 131/78    Pulse (!) 59    Temp 98.1 F (36.7 C) (Oral)    Ht 6\' 1"  (1.854 m)    Wt 99.8 kg    SpO2 99%    BMI 29.03 kg/m  , BMI Body mass index is 29.03 kg/m. GEN: Well nourished, well developed, in no acute distress  HEENT: normal  Neck: no JVD, carotid  bruits, or masses Cardiac: RRR; no murmurs, rubs, or gallops,no edema  Respiratory:  clear to auscultation bilaterally, normal work of breathing GI: soft, nontender, nondistended, + BS MS: no deformity or atrophy  Skin: warm and dry Neuro:  Strength and sensation are intact Psych: euthymic mood, full affect  Recent Labs: 12/28/2020: NT-Pro BNP 2,733 10/11/2021: BUN 25; Creatinine, Ser 2.01; Hemoglobin 11.9; Platelets 288; Potassium 4.7; Sodium 143    Lipid Panel     Component Value Date/Time   CHOL 84 04/13/2021 0317   CHOL 247 (H) 06/01/2020 0858   TRIG 201 (H) 04/13/2021 0317   HDL 26 (L) 04/13/2021 0317   HDL 33 (L) 06/01/2020 0858   CHOLHDL 3.2 04/13/2021 0317   VLDL 40 04/13/2021 0317   LDLCALC 18 04/13/2021 0317   LDLCALC 151 (H) 06/01/2020 0858     Wt Readings from Last 3 Encounters:  11/03/21 99.8 kg  10/01/21 101.2 kg  09/28/21 101.7 kg      Other studies Reviewed: Additional studies/ records that were reviewed today include: LHC 04/12/21  Review of the above records today demonstrates:  Severe single-vessel coronary artery disease with multifocal disease of the proximal  and mid LAD of up to 80% that is hemodynamically significant by RFR and IVUS criteria. Moderate LCx disease with 50% de novo stenosis proximal to stent placed in 09/2020.  There is also 40% in-stent restenosis in the LCx/OM2 stent.  These lesions are not hemodynamically significant (RFR = 0.98). Mildly elevated left heart, right heart, and pulmonary artery pressures. Normal Fick cardiac output/index. Successful IVUS- and RFR-guided PCI to 80% mid LAD stenosis using Onyx Frontier 2.5 x 30 mm drug-eluting stent (postdilated to 3.2 mm) with 0% residual stenosis and TIMI-3 flow.   TTE 07/16/21  1. Left ventricular ejection fraction, by estimation, is 30 to 35%. The  left ventricle has moderately decreased function. The left ventricle has  no regional wall motion abnormalities. Left ventricular diastolic  parameters are indeterminate.   2. Right ventricular systolic function is normal. The right ventricular  size is normal.   3. The mitral valve is normal in structure. No evidence of mitral valve  regurgitation. No evidence of mitral stenosis.   4. The aortic valve is normal in structure. Aortic valve regurgitation is  trivial. No aortic stenosis is present.   5. Aneurysm of the ascending aorta, measuring 44 mm.   6. The inferior vena cava is normal in size with greater than 50%  respiratory variability, suggesting right atrial pressure of 3 mmHg.  ASSESSMENT AND PLAN:  1.  Chronic systolic heart failure due to ischemic cardiomyopathy:  ICD Criteria  Current LVEF:33%. Within 12 months prior to implant: Yes   Heart failure history: Yes, Class II  Cardiomyopathy history: Yes, Ischemic Cardiomyopathy - Prior MI.  Atrial Fibrillation/Atrial Flutter: No.  Ventricular tachycardia history: No.  Cardiac arrest history: No.  History of syndromes with risk of sudden death: No.  Previous ICD: No.  Current ICD indication: Primary  PPM indication: No.  Class I or II Bradycardia indication  present: No  Beta Blocker therapy for 3 or more months: Yes, prescribed.   Ace Inhibitor/ARB therapy for 3 or more months: Yes, prescribed.    I have seen Britain Saber is a 76 y.o. malepre-procedural and has been referred by Agustin Cree for consideration of ICD implant for primary prevention of sudden death.  The patient's chart has been reviewed and they meet criteria for ICD implant.  I have had a thorough  discussion with the patient reviewing options.  The patient and their family (if available) have had opportunities to ask questions and have them answered. The patient and I have decided together through the [employer] to implant ICD at this time.  Risks, benefits, alternatives to ICD implantation were discussed in detail with the patient today. The patient  understands that the risks include but are not limited to bleeding, infection, pneumothorax, perforation, tamponade, vascular damage, renal failure, MI, stroke, death, inappropriate shocks, and lead dislodgement and wishes to proceed.

## 2021-11-03 NOTE — Telephone Encounter (Signed)
Re-reviewed procedure instructions with pt. Pt reports he took his morning medications this morning.   Called hospital and spoke to nurse in EP lab that confirmed w/ Camnitz that pt should still arrive at 12:30 pm today for his planned procedure.  Pt aware and agreeable to plan, aware MD will evaluate when he arrive to determine if ok to proceed with procedure.

## 2021-11-03 NOTE — Discharge Instructions (Signed)
After Your ICD (Implantable Cardiac Defibrillator)   You have a [employer]onic ICD  ACTIVITY Do not lift your arm above shoulder height for 1 week after your procedure. After 7 days, you may progress as below.  You should remove your sling 24 hours after your procedure, unless otherwise instructed by your provider.     Wednesday November 10, 2021  Thursday November 11, 2021 Friday November 12, 2021 Saturday November 13, 2021   Do not lift, push, pull, or carry anything over 10 pounds with the affected arm until 6 weeks (Wednesday December 15, 2021 ) after your procedure.   You may drive AFTER your wound check, unless you have been told otherwise by your provider.   Ask your healthcare provider when you can go back to work   INCISION/Dressing If you are on a blood thinner such as Coumadin, Xarelto, Eliquis, Plavix, or Pradaxa please confirm with your provider when this should be resumed.   If large square, outer bandage is left in place, this can be removed after 24 hours from your procedure. Do not remove steri-strips or glue as below.   Monitor your defibrillator site for redness, swelling, and drainage. Call the device clinic at [phone] if you experience these symptoms or fever/chills.  If your incision is sealed with Steri-strips or staples, you may shower 10 days after your procedure or when told by your provider. Do not remove the steri-strips or let the shower hit directly on your site. You may wash around your site with soap and water.    If you were discharged in a sling, please do not wear this during the day more than 48 hours after your surgery unless otherwise instructed. This may increase the risk of stiffness and soreness in your shoulder.   Avoid lotions, ointments, or perfumes over your incision until it is well-healed.  You may use a hot tub or a pool AFTER your wound check appointment if the incision is completely closed.  Your ICD is designed to protect you from life threatening  heart rhythms. Because of this, you may receive a shock.   1 shock with no symptoms:  Call the office during business hours. 1 shock with symptoms (chest pain, chest pressure, dizziness, lightheadedness, shortness of breath, overall feeling unwell):  Call 911. If you experience 2 or more shocks in 24 hours:  Call 911. If you receive a shock, you should not drive for 6 months per the [place] DMV IF you receive appropriate therapy from your ICD.   ICD Alerts:  Some alerts are vibratory and others beep. These are NOT emergencies. Please call our office to let us know. If this occurs at night or on weekends, it can wait until the next business day. Send a remote transmission.  If your device is capable of reading fluid status (for heart failure), you will be offered monthly monitoring to review this with you.   DEVICE MANAGEMENT Remote monitoring is used to monitor your ICD from home. This monitoring is scheduled every 91 days by our office. It allows [place] to keep an eye on the functioning of your device to ensure it is working properly. You will routinely see your Electrophysiologist annually (more often if necessary).   You should receive your ID card for your new device in 4-8 weeks. Keep this card with you at all times once received. Consider wearing a medical alert bracelet or necklace.  Your ICD  may be MRI compatible. This will be discussed at your next  office visit/wound check.  You should avoid contact with strong electric or magnetic fields.   Do not use amateur (ham) radio equipment or electric (arc) welding torches. MP3 player headphones with magnets should not be used. Some devices are safe to use if held at least 12 inches (30 cm) from your defibrillator. These include power tools, lawn mowers, and speakers. If you are unsure if something is safe to use, ask your health care provider.  When using your cell phone, hold it to the ear that is on the opposite side from the defibrillator. Do not  leave your cell phone in a pocket over the defibrillator.  You may safely use electric blankets, heating pads, computers, and microwave ovens.  Call the office right away if: You have chest pain. You feel more than one shock. You feel more short of breath than you have felt before. You feel more light-headed than you have felt before. Your incision starts to open up.  This information is not intended to replace advice given to you by your health care provider. Make sure you discuss any questions you have with your health care provider.

## 2021-11-03 NOTE — Telephone Encounter (Signed)
° °  Pt is calling to ask what he needs to do before his ICD implant today

## 2021-11-04 ENCOUNTER — Telehealth: Payer: Self-pay

## 2021-11-04 ENCOUNTER — Encounter (HOSPITAL_COMMUNITY): Payer: Self-pay | Admitting: Cardiology

## 2021-11-04 NOTE — Telephone Encounter (Signed)
-----   Message from Shirley Friar, PA-C sent at 11/03/2021  3:51 PM EST ----- Same day BIV d/c WC

## 2021-11-04 NOTE — Telephone Encounter (Signed)
Spoke with patient regarding same day discharge instructions of Bi-V device patient voiced understanding of lifting restrictions, to leave steri-strips in place until wound check on 11/18/21 at 2:00pm and to call for drainage/bleeding fever or chills.

## 2021-11-08 ENCOUNTER — Other Ambulatory Visit: Payer: Self-pay

## 2021-11-08 ENCOUNTER — Telehealth: Payer: Self-pay | Admitting: Cardiology

## 2021-11-08 MED ORDER — HYDRALAZINE HCL 10 MG PO TABS: 10.0000 mg | ORAL_TABLET | Freq: Three times a day (TID) | ORAL | 3 refills | Status: DC

## 2021-11-08 NOTE — Telephone Encounter (Signed)
Called patient and informed him that [name] would like him to continue taking his hydralazine. Patient stated he understood and had no other questions.

## 2021-11-08 NOTE — Telephone Encounter (Signed)
New Message:    Pt c/o medication issue:  1. Name of Medication: Hydralazine  2. How are you currently taking this medication (dosage and times per day)? 3 times a day  3. Are you having a reaction (difficulty breathing--STAT)?   4. What is your medication issue?  Patient wants to know if he  needs to continue to take it?

## 2021-11-11 DIAGNOSIS — I2510 Atherosclerotic heart disease of native coronary artery without angina pectoris: Secondary | ICD-10-CM | POA: Diagnosis not present

## 2021-11-11 DIAGNOSIS — N400 Enlarged prostate without lower urinary tract symptoms: Secondary | ICD-10-CM | POA: Diagnosis not present

## 2021-11-11 DIAGNOSIS — N179 Acute kidney failure, unspecified: Secondary | ICD-10-CM | POA: Diagnosis not present

## 2021-11-11 DIAGNOSIS — D631 Anemia in chronic kidney disease: Secondary | ICD-10-CM | POA: Diagnosis not present

## 2021-11-11 DIAGNOSIS — I5022 Chronic systolic (congestive) heart failure: Secondary | ICD-10-CM | POA: Diagnosis not present

## 2021-11-11 DIAGNOSIS — I129 Hypertensive chronic kidney disease with stage 1 through stage 4 chronic kidney disease, or unspecified chronic kidney disease: Secondary | ICD-10-CM | POA: Diagnosis not present

## 2021-11-11 DIAGNOSIS — N1832 Chronic kidney disease, stage 3b: Secondary | ICD-10-CM | POA: Diagnosis not present

## 2021-11-16 DIAGNOSIS — M79674 Pain in right toe(s): Secondary | ICD-10-CM | POA: Diagnosis not present

## 2021-11-16 DIAGNOSIS — M79675 Pain in left toe(s): Secondary | ICD-10-CM | POA: Diagnosis not present

## 2021-11-16 DIAGNOSIS — L600 Ingrowing nail: Secondary | ICD-10-CM | POA: Diagnosis not present

## 2021-11-16 DIAGNOSIS — B351 Tinea unguium: Secondary | ICD-10-CM | POA: Diagnosis not present

## 2021-11-18 ENCOUNTER — Ambulatory Visit (INDEPENDENT_AMBULATORY_CARE_PROVIDER_SITE_OTHER): Payer: Medicare Other

## 2021-11-18 ENCOUNTER — Ambulatory Visit: Payer: Medicare Other

## 2021-11-18 ENCOUNTER — Other Ambulatory Visit: Payer: Self-pay

## 2021-11-18 DIAGNOSIS — I255 Ischemic cardiomyopathy: Secondary | ICD-10-CM | POA: Diagnosis not present

## 2021-11-18 NOTE — Patient Instructions (Signed)

## 2021-11-24 ENCOUNTER — Other Ambulatory Visit: Payer: Self-pay | Admitting: Physician Assistant

## 2021-11-25 DIAGNOSIS — R3914 Feeling of incomplete bladder emptying: Secondary | ICD-10-CM | POA: Diagnosis not present

## 2021-11-25 DIAGNOSIS — R3912 Poor urinary stream: Secondary | ICD-10-CM | POA: Diagnosis not present

## 2021-11-25 NOTE — Telephone Encounter (Signed)
Isosorbide dinitrate 30 mg # 180 x 1 refill sent to [facility] ?

## 2021-11-29 LAB — CUP PACEART INCLINIC DEVICE CHECK
Battery Remaining Longevity: 108 mo
Battery Voltage: 3.11 V
Brady Statistic AP VP Percent: 6.2 %
Brady Statistic AP VS Percent: 0.09 %
Brady Statistic AS VP Percent: 92.09 %
Brady Statistic AS VS Percent: 1.62 %
Brady Statistic RA Percent Paced: 6.22 %
Brady Statistic RV Percent Paced: 68.26 %
Date Time Interrogation Session: 20230309122700
HighPow Impedance: 59 Ohm
Implantable Lead Implant Date: 20230222
Implantable Lead Implant Date: 20230222
Implantable Lead Implant Date: 20230222
Implantable Lead Location: 753858
Implantable Lead Location: 753859
Implantable Lead Location: 753860
Implantable Lead Model: 4798
Implantable Lead Model: 5076
Implantable Pulse Generator Implant Date: 20230222
Lead Channel Impedance Value: 194.634
Lead Channel Impedance Value: 194.634
Lead Channel Impedance Value: 207.273
Lead Channel Impedance Value: 212.8 Ohm
Lead Channel Impedance Value: 212.8 Ohm
Lead Channel Impedance Value: 323 Ohm
Lead Channel Impedance Value: 380 Ohm
Lead Channel Impedance Value: 399 Ohm
Lead Channel Impedance Value: 399 Ohm
Lead Channel Impedance Value: 399 Ohm
Lead Channel Impedance Value: 437 Ohm
Lead Channel Impedance Value: 456 Ohm
Lead Channel Impedance Value: 589 Ohm
Lead Channel Impedance Value: 722 Ohm
Lead Channel Impedance Value: 722 Ohm
Lead Channel Impedance Value: 760 Ohm
Lead Channel Impedance Value: 779 Ohm
Lead Channel Impedance Value: 836 Ohm
Lead Channel Pacing Threshold Amplitude: 0.5 V
Lead Channel Pacing Threshold Amplitude: 0.625 V
Lead Channel Pacing Threshold Amplitude: 1.25 V
Lead Channel Pacing Threshold Pulse Width: 0.4 ms
Lead Channel Pacing Threshold Pulse Width: 0.4 ms
Lead Channel Pacing Threshold Pulse Width: 0.4 ms
Lead Channel Sensing Intrinsic Amplitude: 1.375 mV
Lead Channel Sensing Intrinsic Amplitude: 1.625 mV
Lead Channel Sensing Intrinsic Amplitude: 12 mV
Lead Channel Sensing Intrinsic Amplitude: 12.125 mV
Lead Channel Setting Pacing Amplitude: 2 V
Lead Channel Setting Pacing Amplitude: 3.5 V
Lead Channel Setting Pacing Amplitude: 3.5 V
Lead Channel Setting Pacing Pulse Width: 0.4 ms
Lead Channel Setting Pacing Pulse Width: 0.4 ms
Lead Channel Setting Sensing Sensitivity: 0.3 mV

## 2021-11-29 IMAGING — DX DG CHEST 1V
1 series · 1 of 1 positions shown · non-contrast
Comparison: October 08, 2018

CLINICAL DATA: Shortness of breath

EXAM:
CHEST  1 VIEW

[chest ap]
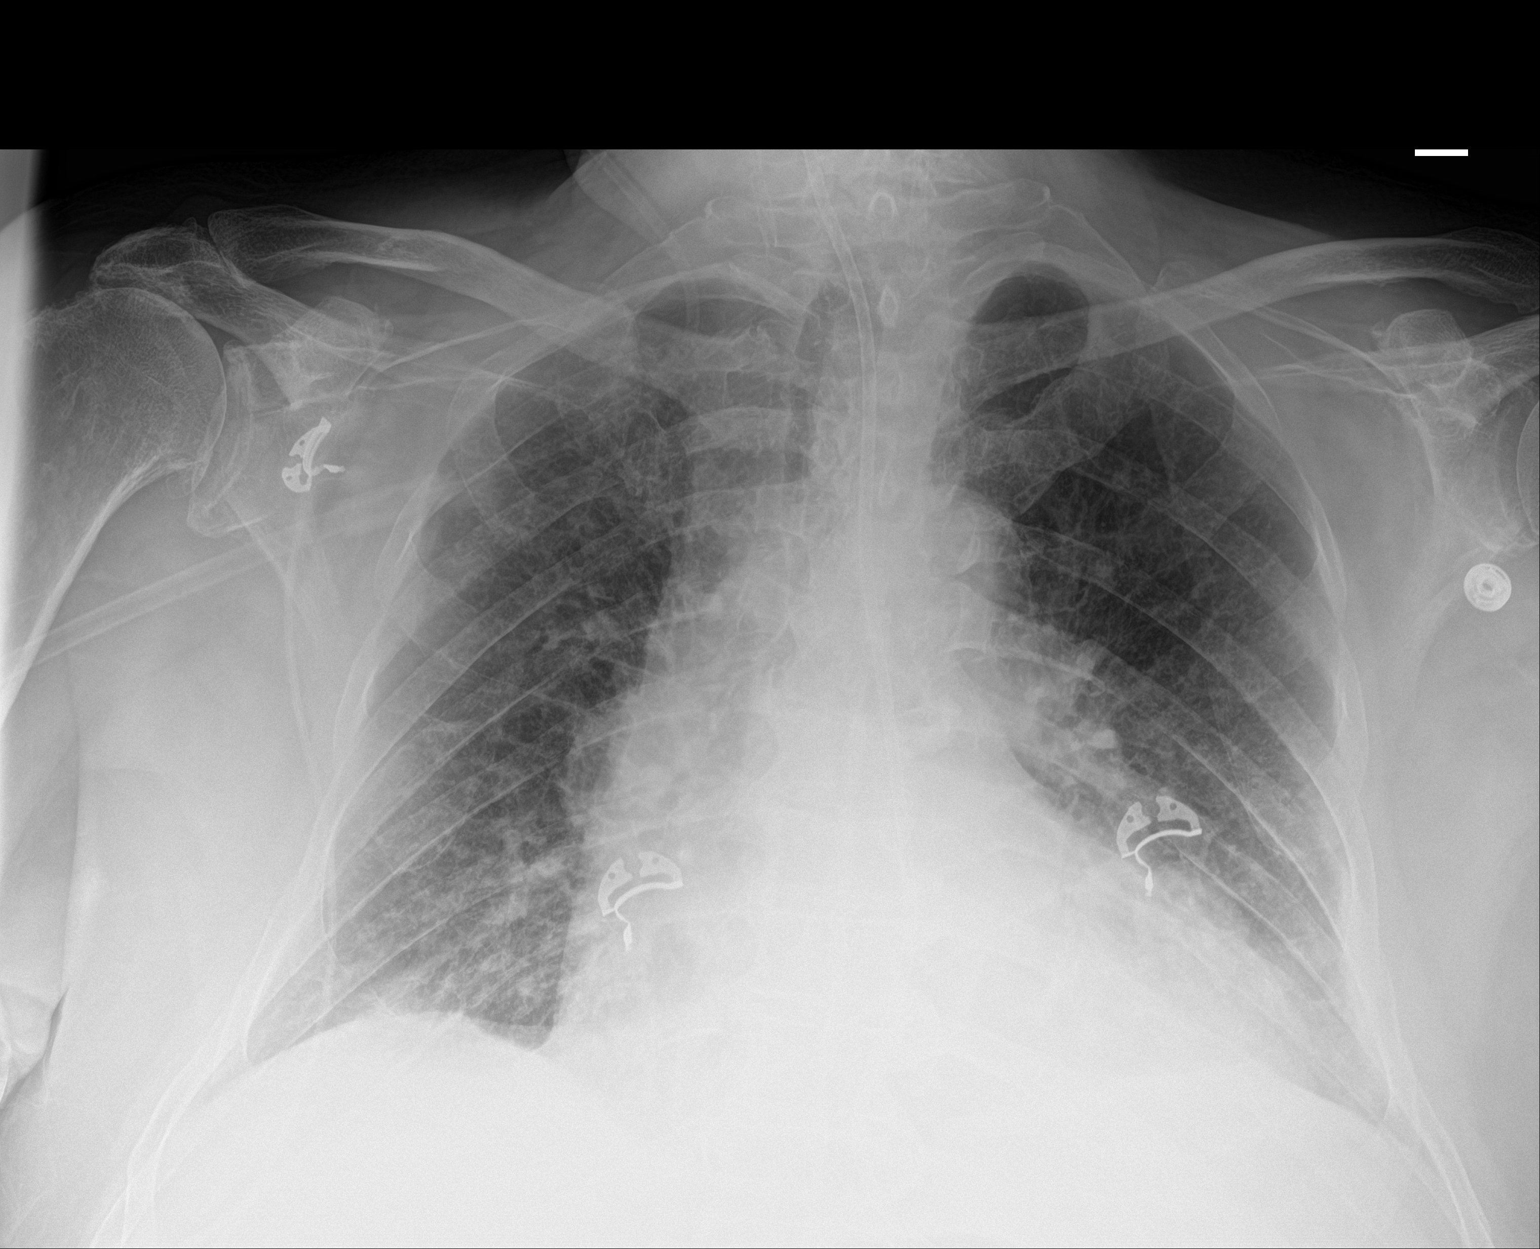

[1 of 1 positions shown; findings below may reference images not displayed]

FINDINGS: The cardiomediastinal silhouette is unchanged and enlarged in
contour. The enteric tube courses through the chest to the abdomen
beyond the field-of-view. No pleural effusion. No pneumothorax.
Decreased bibasilar heterogeneous opacities. Mild residual raises
Agaetisson lower nodular opacities. Visualized abdomen is unremarkable.
Multilevel degenerative changes of the thoracic spine.
IMPRESSION: Persistent but decreased bibasilar heterogeneous opacities.

## 2021-11-29 NOTE — Progress Notes (Signed)
Wound check appointment. Dermabond removed. Wound without redness or edema. Incision edges approximated, wound well healed. Normal device function. Thresholds, sensing, and impedances consistent with implant measurements. Device programmed at 3.5V for extra safety margin until 3 month visit. Histogram distribution appropriate for patient and level of activity. No mode switches or ventricular arrhythmias noted. Patient educated about wound care, arm mobility, lifting restrictions, shock plan. ROV in 3 months with implanting physician. 

## 2021-11-30 DIAGNOSIS — R3914 Feeling of incomplete bladder emptying: Secondary | ICD-10-CM | POA: Diagnosis not present

## 2021-12-09 ENCOUNTER — Telehealth: Payer: Self-pay | Admitting: Cardiology

## 2021-12-09 NOTE — Telephone Encounter (Signed)
?*  STAT* If patient is at the pharmacy, call can be transferred to refill team. ? ? ?1. Which medications need to be refilled? (please list name of each medication and dose if known)  ?isosorbide dinitrate (ISORDIL) 30 MG tablet ?atorvastatin (LIPITOR) 80 MG tablet ? ?2. Which pharmacy/location (including street and city if local pharmacy) is medication to be sent to? [id number] [place] ?3. Do they need a 30 day or 90 day supply?  ? 90 day supply ? ?Patient states he only has a few tablets remaining. ? ?

## 2021-12-10 ENCOUNTER — Other Ambulatory Visit: Payer: Self-pay

## 2021-12-10 MED ORDER — ATORVASTATIN CALCIUM 80 MG PO TABS: 80.0000 mg | ORAL_TABLET | Freq: Every day | ORAL | 3 refills | Status: DC

## 2021-12-10 MED ORDER — ISOSORBIDE DINITRATE 30 MG PO TABS: 30.0000 mg | ORAL_TABLET | Freq: Two times a day (BID) | ORAL | 2 refills | Status: DC

## 2021-12-10 NOTE — Telephone Encounter (Signed)
Medications refilled for the patient. ?

## 2021-12-14 DIAGNOSIS — L600 Ingrowing nail: Secondary | ICD-10-CM | POA: Diagnosis not present

## 2021-12-14 DIAGNOSIS — M79674 Pain in right toe(s): Secondary | ICD-10-CM | POA: Diagnosis not present

## 2021-12-14 DIAGNOSIS — B351 Tinea unguium: Secondary | ICD-10-CM | POA: Diagnosis not present

## 2021-12-14 DIAGNOSIS — M79675 Pain in left toe(s): Secondary | ICD-10-CM | POA: Diagnosis not present

## 2021-12-16 DIAGNOSIS — R3914 Feeling of incomplete bladder emptying: Secondary | ICD-10-CM | POA: Diagnosis not present

## 2021-12-31 DIAGNOSIS — R350 Frequency of micturition: Secondary | ICD-10-CM | POA: Diagnosis not present

## 2021-12-31 DIAGNOSIS — N401 Enlarged prostate with lower urinary tract symptoms: Secondary | ICD-10-CM | POA: Diagnosis not present

## 2022-01-03 DIAGNOSIS — R338 Other retention of urine: Secondary | ICD-10-CM | POA: Diagnosis not present

## 2022-01-03 DIAGNOSIS — N401 Enlarged prostate with lower urinary tract symptoms: Secondary | ICD-10-CM | POA: Diagnosis not present

## 2022-01-04 ENCOUNTER — Telehealth: Payer: Self-pay | Admitting: Cardiology

## 2022-01-04 NOTE — Telephone Encounter (Signed)
? ?  Pre-operative Risk Assessment  ?  ?Patient Name: Timothy Moran  ?DOB: [birth year] ?MRN: [id number]  ? ?  ? ?Request for Surgical Clearance   ? ?Procedure:   Rezum BPH ? ?Date of Surgery:  Clearance 02/21/22                              ?   ?Surgeon:  Dr. Gloriann Loan ?Surgeon's Group or Practice Name:  [facility]   ?Phone number:  [phone] ?Fax number:  [phone] ?  ?Type of Clearance Requested:   ?- Medical  ?- Pharmacy:  Hold Aspirin   ?  ?Type of Anesthesia:   nitrous ?  ?Additional requests/questions:     ? ?Signed, ?Ermelinda Das   ?01/04/2022, 2:58 PM   ?

## 2022-01-05 NOTE — Telephone Encounter (Signed)
Please review surgical clearance during office visit 02/02/22.  ?

## 2022-01-10 DIAGNOSIS — B351 Tinea unguium: Secondary | ICD-10-CM | POA: Diagnosis not present

## 2022-01-10 DIAGNOSIS — M79675 Pain in left toe(s): Secondary | ICD-10-CM | POA: Diagnosis not present

## 2022-01-10 DIAGNOSIS — M79674 Pain in right toe(s): Secondary | ICD-10-CM | POA: Diagnosis not present

## 2022-01-10 DIAGNOSIS — L600 Ingrowing nail: Secondary | ICD-10-CM | POA: Diagnosis not present

## 2022-01-10 NOTE — Telephone Encounter (Signed)
? ?  Patient Name: Timothy Moran  ?DOB: 01-Sep-1946 ?MRN: [id number] ? ?Primary Cardiologist: Jenne Campus, MD ? ?Chart reviewed as part of pre-operative protocol coverage.  ? ?The patient already has an upcoming appointment scheduled 02/02/22 with [name] at which time this clearance can be addressed in case there are any new issues addressed that would impact pre-operative recommendations. (Procedure date of 02/21/22 falls after appointment.) ? ?- I added "preop" comment to appointment notes so that provider is aware to address at time of OV. APP below also already routed to primary cardiologist as Juluis Rainier. Per office protocol, the provider seeing this patient should forward their finalized clearance decision to requesting party below. ? ?- Will fax update to requesting surgeon so they are aware and remove from preop box as separate preop APP input not necessary at this time. ? ?Charlie Pitter, PA-C ?01/10/2022, 11:36 AM ? ?

## 2022-01-21 ENCOUNTER — Emergency Department (HOSPITAL_COMMUNITY): Payer: Medicare Other

## 2022-01-21 ENCOUNTER — Inpatient Hospital Stay (HOSPITAL_COMMUNITY)
Admission: EM | Admit: 2022-01-21 | Discharge: 2022-01-25 | DRG: 081 | Disposition: A | Discharge status: Rehabilitation Facility | Payer: Medicare Other | Attending: Internal Medicine | Admitting: Internal Medicine

## 2022-01-21 ENCOUNTER — Other Ambulatory Visit: Payer: Self-pay

## 2022-01-21 ENCOUNTER — Encounter (HOSPITAL_COMMUNITY): Payer: Self-pay | Admitting: Emergency Medicine

## 2022-01-21 DIAGNOSIS — Z955 Presence of coronary angioplasty implant and graft: Secondary | ICD-10-CM | POA: Diagnosis not present

## 2022-01-21 DIAGNOSIS — Z8674 Personal history of sudden cardiac arrest: Secondary | ICD-10-CM | POA: Diagnosis not present

## 2022-01-21 DIAGNOSIS — Z833 Family history of diabetes mellitus: Secondary | ICD-10-CM | POA: Diagnosis not present

## 2022-01-21 DIAGNOSIS — Y92009 Unspecified place in unspecified non-institutional (private) residence as the place of occurrence of the external cause: Secondary | ICD-10-CM | POA: Diagnosis not present

## 2022-01-21 DIAGNOSIS — R102 Pelvic and perineal pain: Secondary | ICD-10-CM | POA: Diagnosis not present

## 2022-01-21 DIAGNOSIS — Z95810 Presence of automatic (implantable) cardiac defibrillator: Secondary | ICD-10-CM | POA: Diagnosis not present

## 2022-01-21 DIAGNOSIS — R270 Ataxia, unspecified: Secondary | ICD-10-CM | POA: Diagnosis present

## 2022-01-21 DIAGNOSIS — S143XXD Injury of brachial plexus, subsequent encounter: Secondary | ICD-10-CM | POA: Diagnosis not present

## 2022-01-21 DIAGNOSIS — N281 Cyst of kidney, acquired: Secondary | ICD-10-CM | POA: Diagnosis not present

## 2022-01-21 DIAGNOSIS — D329 Benign neoplasm of meninges, unspecified: Secondary | ICD-10-CM | POA: Diagnosis present

## 2022-01-21 DIAGNOSIS — I252 Old myocardial infarction: Secondary | ICD-10-CM | POA: Diagnosis not present

## 2022-01-21 DIAGNOSIS — E1122 Type 2 diabetes mellitus with diabetic chronic kidney disease: Secondary | ICD-10-CM | POA: Diagnosis present

## 2022-01-21 DIAGNOSIS — Z87891 Personal history of nicotine dependence: Secondary | ICD-10-CM | POA: Diagnosis not present

## 2022-01-21 DIAGNOSIS — W19XXXA Unspecified fall, initial encounter: Secondary | ICD-10-CM

## 2022-01-21 DIAGNOSIS — I6503 Occlusion and stenosis of bilateral vertebral arteries: Secondary | ICD-10-CM | POA: Diagnosis not present

## 2022-01-21 DIAGNOSIS — I5022 Chronic systolic (congestive) heart failure: Secondary | ICD-10-CM | POA: Diagnosis not present

## 2022-01-21 DIAGNOSIS — K8020 Calculus of gallbladder without cholecystitis without obstruction: Secondary | ICD-10-CM | POA: Diagnosis not present

## 2022-01-21 DIAGNOSIS — N390 Urinary tract infection, site not specified: Secondary | ICD-10-CM | POA: Diagnosis not present

## 2022-01-21 DIAGNOSIS — Z950 Presence of cardiac pacemaker: Secondary | ICD-10-CM

## 2022-01-21 DIAGNOSIS — Z20822 Contact with and (suspected) exposure to covid-19: Secondary | ICD-10-CM | POA: Diagnosis present

## 2022-01-21 DIAGNOSIS — G8191 Hemiplegia, unspecified affecting right dominant side: Secondary | ICD-10-CM | POA: Diagnosis present

## 2022-01-21 DIAGNOSIS — I2510 Atherosclerotic heart disease of native coronary artery without angina pectoris: Secondary | ICD-10-CM | POA: Diagnosis not present

## 2022-01-21 DIAGNOSIS — M545 Low back pain, unspecified: Secondary | ICD-10-CM | POA: Diagnosis not present

## 2022-01-21 DIAGNOSIS — I25810 Atherosclerosis of coronary artery bypass graft(s) without angina pectoris: Secondary | ICD-10-CM | POA: Diagnosis not present

## 2022-01-21 DIAGNOSIS — Z978 Presence of other specified devices: Secondary | ICD-10-CM | POA: Diagnosis not present

## 2022-01-21 DIAGNOSIS — Z86711 Personal history of pulmonary embolism: Secondary | ICD-10-CM | POA: Diagnosis present

## 2022-01-21 DIAGNOSIS — I255 Ischemic cardiomyopathy: Secondary | ICD-10-CM | POA: Diagnosis present

## 2022-01-21 DIAGNOSIS — R29705 NIHSS score 5: Secondary | ICD-10-CM | POA: Diagnosis present

## 2022-01-21 DIAGNOSIS — I272 Pulmonary hypertension, unspecified: Secondary | ICD-10-CM | POA: Diagnosis present

## 2022-01-21 DIAGNOSIS — R5381 Other malaise: Secondary | ICD-10-CM | POA: Diagnosis present

## 2022-01-21 DIAGNOSIS — J90 Pleural effusion, not elsewhere classified: Secondary | ICD-10-CM | POA: Diagnosis not present

## 2022-01-21 DIAGNOSIS — R29898 Other symptoms and signs involving the musculoskeletal system: Secondary | ICD-10-CM | POA: Diagnosis not present

## 2022-01-21 DIAGNOSIS — R339 Retention of urine, unspecified: Secondary | ICD-10-CM | POA: Diagnosis not present

## 2022-01-21 DIAGNOSIS — R079 Chest pain, unspecified: Secondary | ICD-10-CM | POA: Diagnosis not present

## 2022-01-21 DIAGNOSIS — G936 Cerebral edema: Principal | ICD-10-CM | POA: Diagnosis present

## 2022-01-21 DIAGNOSIS — Z806 Family history of leukemia: Secondary | ICD-10-CM | POA: Diagnosis not present

## 2022-01-21 DIAGNOSIS — S50311A Abrasion of right elbow, initial encounter: Secondary | ICD-10-CM | POA: Diagnosis present

## 2022-01-21 DIAGNOSIS — R001 Bradycardia, unspecified: Secondary | ICD-10-CM | POA: Diagnosis not present

## 2022-01-21 DIAGNOSIS — Z7982 Long term (current) use of aspirin: Secondary | ICD-10-CM | POA: Diagnosis not present

## 2022-01-21 DIAGNOSIS — E785 Hyperlipidemia, unspecified: Secondary | ICD-10-CM | POA: Diagnosis present

## 2022-01-21 DIAGNOSIS — I7121 Aneurysm of the ascending aorta, without rupture: Secondary | ICD-10-CM | POA: Diagnosis present

## 2022-01-21 DIAGNOSIS — D320 Benign neoplasm of cerebral meninges: Secondary | ICD-10-CM | POA: Diagnosis present

## 2022-01-21 DIAGNOSIS — Z808 Family history of malignant neoplasm of other organs or systems: Secondary | ICD-10-CM

## 2022-01-21 DIAGNOSIS — I6523 Occlusion and stenosis of bilateral carotid arteries: Secondary | ICD-10-CM | POA: Diagnosis not present

## 2022-01-21 DIAGNOSIS — Z7902 Long term (current) use of antithrombotics/antiplatelets: Secondary | ICD-10-CM | POA: Diagnosis not present

## 2022-01-21 DIAGNOSIS — W19XXXD Unspecified fall, subsequent encounter: Secondary | ICD-10-CM | POA: Diagnosis present

## 2022-01-21 DIAGNOSIS — R200 Anesthesia of skin: Secondary | ICD-10-CM | POA: Diagnosis not present

## 2022-01-21 DIAGNOSIS — Z79899 Other long term (current) drug therapy: Secondary | ICD-10-CM

## 2022-01-21 DIAGNOSIS — J984 Other disorders of lung: Secondary | ICD-10-CM | POA: Diagnosis not present

## 2022-01-21 DIAGNOSIS — R531 Weakness: Secondary | ICD-10-CM

## 2022-01-21 DIAGNOSIS — M47814 Spondylosis without myelopathy or radiculopathy, thoracic region: Secondary | ICD-10-CM | POA: Diagnosis present

## 2022-01-21 DIAGNOSIS — N1832 Chronic kidney disease, stage 3b: Secondary | ICD-10-CM | POA: Diagnosis not present

## 2022-01-21 DIAGNOSIS — I10 Essential (primary) hypertension: Secondary | ICD-10-CM | POA: Diagnosis not present

## 2022-01-21 DIAGNOSIS — Y92239 Unspecified place in hospital as the place of occurrence of the external cause: Secondary | ICD-10-CM | POA: Diagnosis present

## 2022-01-21 DIAGNOSIS — G8311 Monoplegia of lower limb affecting right dominant side: Secondary | ICD-10-CM | POA: Diagnosis present

## 2022-01-21 DIAGNOSIS — G319 Degenerative disease of nervous system, unspecified: Secondary | ICD-10-CM | POA: Diagnosis not present

## 2022-01-21 DIAGNOSIS — M47812 Spondylosis without myelopathy or radiculopathy, cervical region: Secondary | ICD-10-CM | POA: Diagnosis not present

## 2022-01-21 DIAGNOSIS — D649 Anemia, unspecified: Secondary | ICD-10-CM | POA: Diagnosis present

## 2022-01-21 DIAGNOSIS — S46001S Unspecified injury of muscle(s) and tendon(s) of the rotator cuff of right shoulder, sequela: Secondary | ICD-10-CM | POA: Diagnosis not present

## 2022-01-21 DIAGNOSIS — W1830XA Fall on same level, unspecified, initial encounter: Secondary | ICD-10-CM | POA: Diagnosis present

## 2022-01-21 DIAGNOSIS — S199XXA Unspecified injury of neck, initial encounter: Secondary | ICD-10-CM | POA: Diagnosis not present

## 2022-01-21 DIAGNOSIS — Z79890 Hormone replacement therapy (postmenopausal): Secondary | ICD-10-CM | POA: Diagnosis not present

## 2022-01-21 DIAGNOSIS — Z7901 Long term (current) use of anticoagulants: Secondary | ICD-10-CM | POA: Diagnosis not present

## 2022-01-21 DIAGNOSIS — M25561 Pain in right knee: Secondary | ICD-10-CM | POA: Diagnosis not present

## 2022-01-21 DIAGNOSIS — N183 Chronic kidney disease, stage 3 unspecified: Secondary | ICD-10-CM | POA: Diagnosis present

## 2022-01-21 DIAGNOSIS — Z86011 Personal history of benign neoplasm of the brain: Secondary | ICD-10-CM | POA: Diagnosis not present

## 2022-01-21 DIAGNOSIS — I130 Hypertensive heart and chronic kidney disease with heart failure and stage 1 through stage 4 chronic kidney disease, or unspecified chronic kidney disease: Secondary | ICD-10-CM | POA: Diagnosis present

## 2022-01-21 DIAGNOSIS — Z9049 Acquired absence of other specified parts of digestive tract: Secondary | ICD-10-CM

## 2022-01-21 DIAGNOSIS — I672 Cerebral atherosclerosis: Secondary | ICD-10-CM | POA: Diagnosis not present

## 2022-01-21 DIAGNOSIS — N2889 Other specified disorders of kidney and ureter: Secondary | ICD-10-CM | POA: Diagnosis not present

## 2022-01-21 DIAGNOSIS — M542 Cervicalgia: Secondary | ICD-10-CM | POA: Diagnosis not present

## 2022-01-21 DIAGNOSIS — M47816 Spondylosis without myelopathy or radiculopathy, lumbar region: Secondary | ICD-10-CM | POA: Diagnosis not present

## 2022-01-21 DIAGNOSIS — D72829 Elevated white blood cell count, unspecified: Secondary | ICD-10-CM | POA: Diagnosis present

## 2022-01-21 DIAGNOSIS — M549 Dorsalgia, unspecified: Secondary | ICD-10-CM | POA: Diagnosis not present

## 2022-01-21 DIAGNOSIS — M25511 Pain in right shoulder: Secondary | ICD-10-CM | POA: Diagnosis not present

## 2022-01-21 DIAGNOSIS — S0990XA Unspecified injury of head, initial encounter: Secondary | ICD-10-CM | POA: Diagnosis not present

## 2022-01-21 MED ORDER — DEXAMETHASONE SODIUM PHOSPHATE 10 MG/ML IJ SOLN
10.0000 mg | Freq: Once | INTRAMUSCULAR | Status: AC
  Administered 2022-01-22: 10 mg via INTRAVENOUS
  Filled 2022-01-21: qty 1

## 2022-01-21 NOTE — ED Notes (Signed)
Xray technicians informed staff that pt is "angry, agitated, and refusing imaging at this time."  ?

## 2022-01-21 NOTE — Consult Note (Signed)
NEUROLOGY CONSULTATION NOTE  ? ?Date of service: Jan 21, 2022 ?Patient Name: Timothy Moran ?MRN:  [id number] ?DOB:  1946/06/04 ?Reason for consult: "Stroke code for R sided weakness." ?Requesting Provider: Veatrice Kells, MD ?_ _ _   _ __   _ __ _ _  __ __   _ __   __ _ ? ?History of Present Illness  ?Ancel Easler is a 76 y.o. male with PMH significant for L parafalcine meningioma with associated vasogenic edema in the left posterior frontal and parietal lobes, hx of Afibb not on [facility], hx of cardiac arrest in Jan 2022 s/p ICD placement, CAD on Aspirin and plavix, HLD, HTN, hx of PE, ascending aortic aneurysm who presents to the ED after a fall.  ? ?He reports that he had just walked out the door onto the patio and someone was behind him so he stepped to the side and did not realize that the step ended. He fell 18 inches onto concrete and hit the back of his head and reports that his neck was completely bent. He was unable to get up immediately after the fall. He reports that his RUE and RLE was completely paralyzed. This happened around 2200 on 01/21/22. He was put in C spine precautions. He still reports neck pain. He did not lose consciousness. His RUE weakness is modestly improved and he can lift his RUE off the bed but it is still significantly weak and clumsy. He is also numb in his RUE and in his RLE. ? ?Of note, cardiac monitoring in the ED with Afibb. Denies any prior hx of Afibb. ? ?LKW: 2200 on 01/21/22. ?mRS: 0 ?tNKASE: not offered due to fall on concrete surface, neck pain and hit his head on the wall. ?Thrombectomy: not offered, no LVO ?NIHSS components Score: Comment  ?1a Level of Conscious 0'[]'$  1'[]'$  2'[]'$  3'[]'$      ?1b LOC Questions 0'[x]'$  1'[]'$  2'[]'$       ?1c LOC Commands 0'[x]'$  1'[]'$  2'[]'$       ?2 Best Gaze 0'[x]'$  1'[]'$  2'[]'$       ?3 Visual 0'[x]'$  1'[]'$  2'[]'$  3'[]'$      ?4 Facial Palsy 0'[x]'$  1'[]'$  2'[]'$  3'[]'$      ?5a Motor Arm - left 0'[x]'$  1'[]'$  2'[]'$  3'[]'$  4'[]'$  UN'[]'$    ?5b Motor Arm - Right 0'[]'$  1'[x]'$  2'[]'$  3'[]'$  4'[]'$  UN'[]'$    ?6a Motor Leg - Left  0'[x]'$  1'[]'$  2'[]'$  3'[]'$  4'[]'$  UN'[]'$    ?6b Motor Leg - Right 0'[x]'$  1'[]'$  2'[]'$  3'[]'$  4'[]'$  UN'[]'$    ?7 Limb Ataxia 0'[]'$  1'[]'$  2'[x]'$  3'[]'$  UN'[]'$     ?8 Sensory 0'[]'$  1'[]'$  2'[x]'$  UN'[]'$      ?9 Best Language 0'[x]'$  1'[]'$  2'[]'$  3'[]'$      ?10 Dysarthria 0'[x]'$  1'[]'$  2'[]'$  UN'[]'$      ?11 Extinct. and Inattention 0'[x]'$  1'[]'$  2'[]'$       ?TOTAL: 5   ? ?  ?ROS  ? ?Constitutional Denies weight loss, fever and chills.   ?HEENT Denies changes in vision and hearing.   ?Respiratory Denies SOB and cough.   ?CV Denies palpitations and CP   ?GI Denies abdominal pain, nausea, vomiting and diarrhea.   ?GU Denies dysuria and urinary frequency.   ?MSK Denies myalgia and joint pain.   ?Skin Denies rash and pruritus.   ?Neurological Denies headache and syncope.   ?Psychiatric Denies recent changes in mood. Denies anxiety and depression.   ? ?Past History  ? ?Past Medical History:  ?Diagnosis Date  ? Acute blood loss anemia 10/05/2020  ? Acute respiratory  failure ([place]), hypothermia therapy, vent - extubated 10/06/20   ? AKI (acute kidney injury) ([place]) 10/05/2020  ? Anoxic brain injury ([place]) 10/26/2020  ? Arrhythmia   ? Ascending aortic aneurysm ([place]) 01/29/2018  ? a. 2019 4.3 cm by echo; b. 02/2021 Echo: Ao root 75m.  ? Benign brain tumor ([place]   ? Benign neoplasm of brain ([place] 12/19/2017  ? Cardiac arrest with ventricular fibrillation ([place]   ? Chest pain 12/04/2017  ? Chronic kidney disease, stage III (moderate) ([place] 12/19/2017  ? CKD (chronic kidney disease), stage III - IV ([place]   ? Coronary Artery Disease   ? a. 16/4332Inf STEMI complicated by cardiac arrest/PCI: LAD 50p/m, LCX 100d (2.5 x 30 Resolute Onyx DES); b. 04/2021 PCI: 04/2021 LM nl, LAD 50p/879m2.5x30 Onyx Frontier DES), LCX 25p, 501mFR 0.98), 40d ISR (RFR 0.98), OM2 25, RCA mild diff dzs.  ? Diabetes mellitus, type 2 (HCCBr[place]1/25/2022  ? 10/06/20 A1C 7%  ? Dizziness 10/11/2019  ? Encounter for imaging study to confirm orogastric (OG) tube placement   ? Hematuria 10/26/2020  ? HFrEF (heart failure with reduced ejection fraction)  ([facility]CR[place] ? a. 11/2020 Echo: EF 25-30%; b. 02/2021 Echo: EF 25-30%, glob HK. Mild LVH. Nl RV fxn. Triv AI. Ao root 49m23m? History of pulmonary embolus (PE)   ? HLD (hyperlipidemia) 10/26/2020  ? Hypertension   ? Iron deficiency anemia rec'd IV iron 10/26/2020  ? Ischemic cardiomyopathy   ? a. 11/2020 Echo: EF 25-30%; b. 02/2021 Echo: EF 25-30%, glob HK.  ? Left bundle branch block 12/19/2017  ? Meningioma (HCC)[place]/11/2019  ? Nonsustained ventricular tachycardia (HCC)[place]/29/2021  ? Pain of left hip joint 05/29/2020  ? Personal history of pulmonary embolism 12/19/2017  ? Pneumonia of both lungs due to infectious organism   ? Pulmonary hypertension due to thromboembolism (HCC)[place]/28/2019  ? See echo 12/27/17 with nl PAS vs CTa chest 02/23/18   ? S/P angioplasty with stent 10/04/20 DES to LCX  10/26/2020  ? Sinus bradycardia 12/22/2017  ? SOB (shortness of breath)   ? Solitary pulmonary nodule on lung CT 03/08/2018  ? CT 12/04/17 1.0 x 0.8 x 0.7 cm nodular opacity in the RUL vs not seen 03/16/10 posterior segment of the right upper lobe. .SpiMarland Kitchenometry 03/08/2018    FEV1 3.86 (108%)  Ratio 96 s prior rx  - PET  03/13/18   Low grade c/w adenoca > rec T surgery eval    ? STEMI (ST elevation myocardial infarction) (HCC)[place]/23/2022  ? Urinary retention 10/26/2020  ? Vestibular schwannoma (HCC)[place]/11/2019  ? ?Past Surgical History:  ?Procedure Laterality Date  ? APPENDECTOMY    ? BIV ICD INSERTION CRT-D N/A 11/03/2021  ? Procedure: BIV ICD INSERTION CRT-D;  Surgeon: CamnConstance Haw;  [facility];  Service: Cardiovascular;  Laterality: N/A;  ? CARDIAC CATHETERIZATION    ? CORONARY ANGIOPLASTY WITH STENT PLACEMENT Left 04/12/2021  ? LAD stent placement  ? CORONARY STENT INTERVENTION N/A 04/12/2021  ? Procedure: CORONARY STENT INTERVENTION;  Surgeon: End,Nelva Bush;  [facility];  Service: Cardiovascular;  Laterality: N/A;  ? CORONARY/GRAFT ACUTE MI REVASCULARIZATION N/A 10/04/2020  ? Procedure:  Coronary/Graft Acute MI Revascularization;  Surgeon: End,Nelva Bush;  Location: [facility];  Service: Cardiovascular;  Laterality: N/A;  ? HERNIA REPAIR    ? INTRAVASCULAR PRESSURE WIRE/FFR STUDY N/A 04/12/2021  ? Procedure: INTRAVASCULAR PRESSURE WIRE/FFR STUDY;  Surgeon:  End, Harrell Gave, MD;  Location: [place] CV LAB;  Service: Cardiovascular;  Laterality: N/A;  ? INTRAVASCULAR ULTRASOUND/IVUS N/A 04/12/2021  ? Procedure: Intravascular Ultrasound/IVUS;  Surgeon: Nelva Bush, MD;  Location: [place] CV LAB;  Service: Cardiovascular;  Laterality: N/A;  ? LEFT HEART CATH AND CORONARY ANGIOGRAPHY N/A 10/04/2020  ? Procedure: LEFT HEART CATH AND CORONARY ANGIOGRAPHY;  Surgeon: Nelva Bush, MD;  Location: [place] CV LAB;  Service: Cardiovascular;  Laterality: N/A;  ? RIGHT/LEFT HEART CATH AND CORONARY ANGIOGRAPHY N/A 04/12/2021  ? Procedure: RIGHT/LEFT HEART CATH AND CORONARY ANGIOGRAPHY;  Surgeon: Nelva Bush, MD;  Location: [place] CV LAB;  Service: Cardiovascular;  Laterality: N/A;  ? ?Family History  ?Problem Relation Age of Onset  ? Diabetes Mother   ? Brain cancer Sister   ? Melanoma Brother   ?     Radiation Exposure  ? Leukemia Brother   ?     Agent Orange  ? ?Social History  ? ?Socioeconomic History  ? Marital status: Married  ?  Spouse name: Kurt Hoffmeier   ? Number of children: 1  ? Years of education: 25  ? Highest education level: Bachelor's degree (e.g., BA, AB, BS)  ?Occupational History  ? Not on file  ?Tobacco Use  ? Smoking status: Former  ?  Packs/day: 0.50  ?  Years: 5.00  ?  Pack years: 2.50  ?  Types: Cigarettes  ?  Quit date: 09/12/1968  ?  Years since quitting: 53.3  ? Smokeless tobacco: Never  ?Vaping Use  ? Vaping Use: Never used  ?Substance and Sexual Activity  ? Alcohol use: Yes  ?  Comment: ONE PER WEEK  ? Drug use: Never  ? Sexual activity: Not Currently  ?Other Topics Concern  ? Not on file  ?Social History Narrative  ? Not on file  ? ?Social  Determinants of Health  ? ?Financial Resource Strain: Not on file  ?Food Insecurity: Not on file  ?Transportation Needs: Not on file  ?Physical Activity: Not on file  ?Stress: Not on file  ?Social Connections: Not o

## 2022-01-21 NOTE — ED Notes (Signed)
CN called and notified that per ED triage provider, pt will be activated as level 2 trauma. Pt currently in CT scan. ?

## 2022-01-21 NOTE — ED Notes (Signed)
Dr. Ronnald Ramp NS at bedside ? ? ?

## 2022-01-21 NOTE — ED Provider Triage Note (Addendum)
?  Emergency Medicine Provider Triage Evaluation Note ? ?MRN:  [id number]  ?Arrival date & time: 01/21/22    ?Medically screening exam initiated at 10:34 PM.   ?CC:   ?Fall ?  ?HPI:  ?Timothy Moran is a 76 y.o. year-old male presents to the ED with chief complaint of fall 30 mins PTA.  Missed a step on the patio and fell.  Complains of low back pain, right knee pain, and decreased ROM of right arm.  He states that the decreased ROM of the right arm is new since the fall.  States that it is only mildly proved after the fall, but still has significant weakness in the right arm.  He did not lose consciousness.  He still complains of neck pain.. ? ?History provided by History provided by patient. ?ROS:  ?-As included in HPI ?PE:  ?There were no vitals filed for this visit.  ?Non-toxic appearing ?No respiratory distress ?4/5 strength of right arm ?MDM:  ?Based on signs and symptoms, c spine trauma is highest on my differential, followed by nerve impingement. ?I've ordered CT head and c spine in triage to expedite lab/diagnostic workup.  Placed in cervical collar. ? ?11:08 PM ?After discussion with Dr. Ralene Bathe, patient upgraded to LEVEL 2 trauma per department protocol of trauma (fall) with RUE motor deficit. ? ?Patient was informed that the remainder of the evaluation will be completed by another provider, this initial triage assessment does not replace that evaluation, and the importance of remaining in the ED until their evaluation is complete. ? ?  ?Montine Circle, PA-C ?01/21/22 2241 ? ?  ?Montine Circle, PA-C ?01/21/22 2309 ? ?

## 2022-01-21 NOTE — ED Triage Notes (Signed)
Pt brought to ED by [facility] via wheelchair with c/o of lower back pain, right knee pain and limited mobility in right arm after mechanical fall from porch today. Denies LOC or head injury during fall but cannot name all medications that he takes.  ? ?EMS Vitals ?BP 138/86 ?HR 60 ?RR 16 ?SPO2 98% RA ?

## 2022-01-22 ENCOUNTER — Emergency Department (HOSPITAL_COMMUNITY): Payer: Medicare Other

## 2022-01-22 ENCOUNTER — Encounter (HOSPITAL_COMMUNITY): Payer: Self-pay | Admitting: Neurological Surgery

## 2022-01-22 ENCOUNTER — Inpatient Hospital Stay (HOSPITAL_COMMUNITY): Payer: Medicare Other

## 2022-01-22 DIAGNOSIS — M542 Cervicalgia: Secondary | ICD-10-CM | POA: Diagnosis not present

## 2022-01-22 DIAGNOSIS — S46001S Unspecified injury of muscle(s) and tendon(s) of the rotator cuff of right shoulder, sequela: Secondary | ICD-10-CM | POA: Diagnosis not present

## 2022-01-22 DIAGNOSIS — D329 Benign neoplasm of meninges, unspecified: Secondary | ICD-10-CM | POA: Diagnosis not present

## 2022-01-22 DIAGNOSIS — Z8674 Personal history of sudden cardiac arrest: Secondary | ICD-10-CM | POA: Diagnosis not present

## 2022-01-22 DIAGNOSIS — Z95810 Presence of automatic (implantable) cardiac defibrillator: Secondary | ICD-10-CM | POA: Diagnosis not present

## 2022-01-22 DIAGNOSIS — I672 Cerebral atherosclerosis: Secondary | ICD-10-CM | POA: Diagnosis not present

## 2022-01-22 DIAGNOSIS — I5022 Chronic systolic (congestive) heart failure: Secondary | ICD-10-CM

## 2022-01-22 DIAGNOSIS — Z950 Presence of cardiac pacemaker: Secondary | ICD-10-CM | POA: Diagnosis not present

## 2022-01-22 DIAGNOSIS — R531 Weakness: Secondary | ICD-10-CM | POA: Diagnosis present

## 2022-01-22 DIAGNOSIS — Z79890 Hormone replacement therapy (postmenopausal): Secondary | ICD-10-CM | POA: Diagnosis not present

## 2022-01-22 DIAGNOSIS — Z978 Presence of other specified devices: Secondary | ICD-10-CM | POA: Diagnosis not present

## 2022-01-22 DIAGNOSIS — W19XXXA Unspecified fall, initial encounter: Secondary | ICD-10-CM | POA: Diagnosis not present

## 2022-01-22 DIAGNOSIS — N281 Cyst of kidney, acquired: Secondary | ICD-10-CM | POA: Diagnosis not present

## 2022-01-22 DIAGNOSIS — K8020 Calculus of gallbladder without cholecystitis without obstruction: Secondary | ICD-10-CM | POA: Diagnosis not present

## 2022-01-22 DIAGNOSIS — Z808 Family history of malignant neoplasm of other organs or systems: Secondary | ICD-10-CM | POA: Diagnosis not present

## 2022-01-22 DIAGNOSIS — Z87891 Personal history of nicotine dependence: Secondary | ICD-10-CM | POA: Diagnosis not present

## 2022-01-22 DIAGNOSIS — S143XXD Injury of brachial plexus, subsequent encounter: Secondary | ICD-10-CM | POA: Diagnosis not present

## 2022-01-22 DIAGNOSIS — Z955 Presence of coronary angioplasty implant and graft: Secondary | ICD-10-CM | POA: Diagnosis not present

## 2022-01-22 DIAGNOSIS — R200 Anesthesia of skin: Secondary | ICD-10-CM | POA: Diagnosis not present

## 2022-01-22 DIAGNOSIS — I6523 Occlusion and stenosis of bilateral carotid arteries: Secondary | ICD-10-CM | POA: Diagnosis not present

## 2022-01-22 DIAGNOSIS — Y92239 Unspecified place in hospital as the place of occurrence of the external cause: Secondary | ICD-10-CM | POA: Diagnosis present

## 2022-01-22 DIAGNOSIS — I25810 Atherosclerosis of coronary artery bypass graft(s) without angina pectoris: Secondary | ICD-10-CM | POA: Diagnosis not present

## 2022-01-22 DIAGNOSIS — M47814 Spondylosis without myelopathy or radiculopathy, thoracic region: Secondary | ICD-10-CM | POA: Diagnosis present

## 2022-01-22 DIAGNOSIS — Z86711 Personal history of pulmonary embolism: Secondary | ICD-10-CM

## 2022-01-22 DIAGNOSIS — R270 Ataxia, unspecified: Secondary | ICD-10-CM | POA: Diagnosis present

## 2022-01-22 DIAGNOSIS — N2889 Other specified disorders of kidney and ureter: Secondary | ICD-10-CM | POA: Diagnosis not present

## 2022-01-22 DIAGNOSIS — I7121 Aneurysm of the ascending aorta, without rupture: Secondary | ICD-10-CM | POA: Diagnosis present

## 2022-01-22 DIAGNOSIS — Z7901 Long term (current) use of anticoagulants: Secondary | ICD-10-CM | POA: Diagnosis not present

## 2022-01-22 DIAGNOSIS — S50311A Abrasion of right elbow, initial encounter: Secondary | ICD-10-CM | POA: Diagnosis present

## 2022-01-22 DIAGNOSIS — G8191 Hemiplegia, unspecified affecting right dominant side: Secondary | ICD-10-CM | POA: Diagnosis present

## 2022-01-22 DIAGNOSIS — G8311 Monoplegia of lower limb affecting right dominant side: Secondary | ICD-10-CM | POA: Diagnosis present

## 2022-01-22 DIAGNOSIS — R5381 Other malaise: Secondary | ICD-10-CM | POA: Diagnosis present

## 2022-01-22 DIAGNOSIS — Z806 Family history of leukemia: Secondary | ICD-10-CM | POA: Diagnosis not present

## 2022-01-22 DIAGNOSIS — Z86011 Personal history of benign neoplasm of the brain: Secondary | ICD-10-CM | POA: Diagnosis not present

## 2022-01-22 DIAGNOSIS — D72829 Elevated white blood cell count, unspecified: Secondary | ICD-10-CM | POA: Diagnosis present

## 2022-01-22 DIAGNOSIS — Z79899 Other long term (current) drug therapy: Secondary | ICD-10-CM | POA: Diagnosis not present

## 2022-01-22 DIAGNOSIS — R29898 Other symptoms and signs involving the musculoskeletal system: Secondary | ICD-10-CM | POA: Diagnosis not present

## 2022-01-22 DIAGNOSIS — Y92009 Unspecified place in unspecified non-institutional (private) residence as the place of occurrence of the external cause: Secondary | ICD-10-CM

## 2022-01-22 DIAGNOSIS — E1122 Type 2 diabetes mellitus with diabetic chronic kidney disease: Secondary | ICD-10-CM | POA: Diagnosis present

## 2022-01-22 DIAGNOSIS — R339 Retention of urine, unspecified: Secondary | ICD-10-CM | POA: Diagnosis present

## 2022-01-22 DIAGNOSIS — I272 Pulmonary hypertension, unspecified: Secondary | ICD-10-CM | POA: Diagnosis present

## 2022-01-22 DIAGNOSIS — E785 Hyperlipidemia, unspecified: Secondary | ICD-10-CM | POA: Diagnosis present

## 2022-01-22 DIAGNOSIS — J984 Other disorders of lung: Secondary | ICD-10-CM | POA: Diagnosis not present

## 2022-01-22 DIAGNOSIS — J90 Pleural effusion, not elsewhere classified: Secondary | ICD-10-CM | POA: Diagnosis not present

## 2022-01-22 DIAGNOSIS — D649 Anemia, unspecified: Secondary | ICD-10-CM | POA: Diagnosis present

## 2022-01-22 DIAGNOSIS — Z7982 Long term (current) use of aspirin: Secondary | ICD-10-CM | POA: Diagnosis not present

## 2022-01-22 DIAGNOSIS — Z20822 Contact with and (suspected) exposure to covid-19: Secondary | ICD-10-CM | POA: Diagnosis present

## 2022-01-22 DIAGNOSIS — G319 Degenerative disease of nervous system, unspecified: Secondary | ICD-10-CM | POA: Diagnosis not present

## 2022-01-22 DIAGNOSIS — W1830XA Fall on same level, unspecified, initial encounter: Secondary | ICD-10-CM | POA: Diagnosis present

## 2022-01-22 DIAGNOSIS — Z833 Family history of diabetes mellitus: Secondary | ICD-10-CM | POA: Diagnosis not present

## 2022-01-22 DIAGNOSIS — M47816 Spondylosis without myelopathy or radiculopathy, lumbar region: Secondary | ICD-10-CM | POA: Diagnosis present

## 2022-01-22 DIAGNOSIS — N1832 Chronic kidney disease, stage 3b: Secondary | ICD-10-CM

## 2022-01-22 DIAGNOSIS — D320 Benign neoplasm of cerebral meninges: Secondary | ICD-10-CM | POA: Diagnosis present

## 2022-01-22 DIAGNOSIS — I252 Old myocardial infarction: Secondary | ICD-10-CM | POA: Diagnosis not present

## 2022-01-22 DIAGNOSIS — M25511 Pain in right shoulder: Secondary | ICD-10-CM | POA: Diagnosis not present

## 2022-01-22 DIAGNOSIS — M47812 Spondylosis without myelopathy or radiculopathy, cervical region: Secondary | ICD-10-CM | POA: Diagnosis not present

## 2022-01-22 DIAGNOSIS — I130 Hypertensive heart and chronic kidney disease with heart failure and stage 1 through stage 4 chronic kidney disease, or unspecified chronic kidney disease: Secondary | ICD-10-CM | POA: Diagnosis present

## 2022-01-22 DIAGNOSIS — Z7902 Long term (current) use of antithrombotics/antiplatelets: Secondary | ICD-10-CM | POA: Diagnosis not present

## 2022-01-22 DIAGNOSIS — R29705 NIHSS score 5: Secondary | ICD-10-CM | POA: Diagnosis present

## 2022-01-22 DIAGNOSIS — W19XXXD Unspecified fall, subsequent encounter: Secondary | ICD-10-CM | POA: Diagnosis present

## 2022-01-22 DIAGNOSIS — I255 Ischemic cardiomyopathy: Secondary | ICD-10-CM | POA: Diagnosis present

## 2022-01-22 DIAGNOSIS — I10 Essential (primary) hypertension: Secondary | ICD-10-CM | POA: Diagnosis not present

## 2022-01-22 DIAGNOSIS — G936 Cerebral edema: Secondary | ICD-10-CM | POA: Diagnosis present

## 2022-01-22 DIAGNOSIS — R001 Bradycardia, unspecified: Secondary | ICD-10-CM | POA: Diagnosis not present

## 2022-01-22 DIAGNOSIS — I2510 Atherosclerotic heart disease of native coronary artery without angina pectoris: Secondary | ICD-10-CM | POA: Diagnosis present

## 2022-01-22 DIAGNOSIS — I6503 Occlusion and stenosis of bilateral vertebral arteries: Secondary | ICD-10-CM | POA: Diagnosis not present

## 2022-01-22 LAB — COMPREHENSIVE METABOLIC PANEL
ALT: 22 U/L (ref 0–44)
AST: 28 U/L (ref 15–41)
Albumin: 4.1 g/dL (ref 3.5–5.0)
Alkaline Phosphatase: 63 U/L (ref 38–126)
Anion gap: 8 (ref 5–15)
BUN: 31 mg/dL — ABNORMAL HIGH (ref 8–23)
CO2: 19 mmol/L — ABNORMAL LOW (ref 22–32)
Calcium: 9 mg/dL (ref 8.9–10.3)
Chloride: 109 mmol/L (ref 98–111)
Creatinine, Ser: 2.2 mg/dL — ABNORMAL HIGH (ref 0.61–1.24)
GFR, Estimated: 30 mL/min — ABNORMAL LOW (ref >60)
Glucose, Bld: 128 mg/dL — ABNORMAL HIGH (ref 70–99)
Potassium: 4.3 mmol/L (ref 3.5–5.1)
Sodium: 136 mmol/L (ref 135–145)
Total Bilirubin: 0.8 mg/dL (ref 0.3–1.2)
Total Protein: 7.3 g/dL (ref 6.5–8.1)

## 2022-01-22 LAB — URINALYSIS, ROUTINE W REFLEX MICROSCOPIC
Bilirubin Urine: NEGATIVE
Glucose, UA: NEGATIVE mg/dL
Hgb urine dipstick: NEGATIVE
Ketones, ur: NEGATIVE mg/dL
Nitrite: NEGATIVE
Protein, ur: 30 mg/dL — AB
Specific Gravity, Urine: 1.039 — ABNORMAL HIGH (ref 1.005–1.030)
pH: 6 (ref 5.0–8.0)

## 2022-01-22 LAB — DIFFERENTIAL
Abs Immature Granulocytes: 0.06 10*3/uL (ref 0.00–0.07)
Basophils Absolute: 0.1 10*3/uL (ref 0.0–0.1)
Basophils Relative: 1 %
Eosinophils Absolute: 0.2 10*3/uL (ref 0.0–0.5)
Eosinophils Relative: 2 %
Immature Granulocytes: 1 %
Lymphocytes Relative: 24 %
Lymphs Abs: 2.7 10*3/uL (ref 0.7–4.0)
Monocytes Absolute: 0.9 10*3/uL (ref 0.1–1.0)
Monocytes Relative: 8 %
Neutro Abs: 7.6 10*3/uL (ref 1.7–7.7)
Neutrophils Relative %: 64 %

## 2022-01-22 LAB — LACTIC ACID, PLASMA: Lactic Acid, Venous: 1.6 mmol/L (ref 0.5–1.9)

## 2022-01-22 LAB — RAPID URINE DRUG SCREEN, HOSP PERFORMED
Amphetamines: NOT DETECTED
Barbiturates: NOT DETECTED
Benzodiazepines: NOT DETECTED
Cocaine: NOT DETECTED
Opiates: NOT DETECTED
Tetrahydrocannabinol: NOT DETECTED

## 2022-01-22 LAB — CBC
HCT: 37.2 % — ABNORMAL LOW (ref 39.0–52.0)
Hemoglobin: 12.1 g/dL — ABNORMAL LOW (ref 13.0–17.0)
MCH: 30.8 pg (ref 26.0–34.0)
MCHC: 32.5 g/dL (ref 30.0–36.0)
MCV: 94.7 fL (ref 80.0–100.0)
Platelets: 259 10*3/uL (ref 150–400)
RBC: 3.93 MIL/uL — ABNORMAL LOW (ref 4.22–5.81)
RDW: 13.2 % (ref 11.5–15.5)
WBC: 11.5 10*3/uL — ABNORMAL HIGH (ref 4.0–10.5)
nRBC: 0 % (ref 0.0–0.2)

## 2022-01-22 LAB — I-STAT CHEM 8, ED
BUN: 32 mg/dL — ABNORMAL HIGH (ref 8–23)
Calcium, Ion: 1.19 mmol/L (ref 1.15–1.40)
Chloride: 109 mmol/L (ref 98–111)
Creatinine, Ser: 2.4 mg/dL — ABNORMAL HIGH (ref 0.61–1.24)
Glucose, Bld: 124 mg/dL — ABNORMAL HIGH (ref 70–99)
HCT: 36 % — ABNORMAL LOW (ref 39.0–52.0)
Hemoglobin: 12.2 g/dL — ABNORMAL LOW (ref 13.0–17.0)
Potassium: 4.4 mmol/L (ref 3.5–5.1)
Sodium: 138 mmol/L (ref 135–145)
TCO2: 21 mmol/L — ABNORMAL LOW (ref 22–32)

## 2022-01-22 LAB — APTT: aPTT: 30 seconds (ref 24–36)

## 2022-01-22 LAB — RESP PANEL BY RT-PCR (FLU A&B, COVID) ARPGX2
Influenza A by PCR: NEGATIVE
Influenza B by PCR: NEGATIVE
SARS Coronavirus 2 by RT PCR: NEGATIVE

## 2022-01-22 LAB — SAMPLE TO BLOOD BANK

## 2022-01-22 LAB — PROTIME-INR
INR: 1.1 (ref 0.8–1.2)
Prothrombin Time: 13.7 seconds (ref 11.4–15.2)

## 2022-01-22 LAB — ETHANOL: Alcohol, Ethyl (B): <10 mg/dL (ref <10)

## 2022-01-22 MED ORDER — IOHEXOL 350 MG/ML SOLN
100.0000 mL | Freq: Once | INTRAVENOUS | Status: AC | PRN
  Administered 2022-01-22: 100 mL via INTRAVENOUS

## 2022-01-22 MED ORDER — SODIUM CHLORIDE 0.9 % IV SOLN
1.0000 g | Freq: Once | INTRAVENOUS | Status: AC
  Administered 2022-01-22: 1 g via INTRAVENOUS
  Filled 2022-01-22: qty 10

## 2022-01-22 MED ORDER — ACETAMINOPHEN 500 MG PO TABS
1000.0000 mg | ORAL_TABLET | Freq: Once | ORAL | Status: AC
  Administered 2022-01-22: 1000 mg via ORAL
  Filled 2022-01-22: qty 2

## 2022-01-22 MED ORDER — IOHEXOL 350 MG/ML SOLN
100.0000 mL | Freq: Once | INTRAVENOUS | Status: DC | PRN
Start: 2022-01-22 — End: 2022-01-25

## 2022-01-22 NOTE — Consult Note (Signed)
Reason for Consult: Right-sided weakness after a fall, history of meningioma ?Referring Physician: EDP ? ?Timothy Moran is an 76 y.o. male.  ? ?HPI:  ?76 year old gentleman with a history of meningioma, progressed requiring placement of defibrillator and use of aspirin and Plavix, chronic kidney disease, and diabetes who is seen in the emergency department after a fall about 10 PM on 01/21/2022.  He states that he stepped off of a step and fell by 18 inches and struck his head.  Unclear if he lost consciousness.  He describes some neck pain.  He states he had "paralysis" on the right-hand side but that has improved in the emergency department.  He has been evaluated by neurology.  EDP is going to ask cardiology if he can get an MRI.  Describes some numbness in the right arm and leg. ? ?Past Medical History:  ?Diagnosis Date  ? Acute blood loss anemia 10/05/2020  ? Acute respiratory failure ([place]), hypothermia therapy, vent - extubated 10/06/20   ? AKI (acute kidney injury) ([place]) 10/05/2020  ? Anoxic brain injury ([place]) 10/26/2020  ? Arrhythmia   ? Ascending aortic aneurysm ([place]) 01/29/2018  ? a. 2019 4.3 cm by echo; b. 02/2021 Echo: Ao root 55m.  ? Benign brain tumor ([place]   ? Benign neoplasm of brain ([place]Ar[place] 12/19/2017  ? Cardiac arrest with ventricular fibrillation ([place]   ? Chest pain 12/04/2017  ? Chronic kidney disease, stage III (moderate) (HKn[place] 12/19/2017  ? CKD (chronic kidney disease), stage III - IV (HLa[place]   ? Coronary Artery Disease   ? a. 14/7829Inf STEMI complicated by cardiac arrest/PCI: LAD 50p/m, LCX 100d (2.5 x 30 Resolute Onyx DES); b. 04/2021 PCI: 04/2021 LM nl, LAD 50p/87m2.5x30 Onyx Frontier DES), LCX 25p, 5025mFR 0.98), 40d ISR (RFR 0.98), OM2 25, RCA mild diff dzs.  ? Diabetes mellitus, type 2 (HCCFrenchtown1/25/2022  ? 10/06/20 A1C 7%  ? Dizziness 10/11/2019  ? Encounter for imaging study to confirm orogastric (OG) tube placement   ? Hematuria 10/26/2020  ? HFrEF (heart failure with reduced ejection  fraction) ([place]CC[place] ? a. 11/2020 Echo: EF 25-30%; b. 02/2021 Echo: EF 25-30%, glob HK. Mild LVH. Nl RV fxn. Triv AI. Ao root 34m34m? History of pulmonary embolus (PE)   ? HLD (hyperlipidemia) 10/26/2020  ? Hypertension   ? Iron deficiency anemia rec'd IV iron 10/26/2020  ? Ischemic cardiomyopathy   ? a. 11/2020 Echo: EF 25-30%; b. 02/2021 Echo: EF 25-30%, glob HK.  ? Left bundle branch block 12/19/2017  ? Meningioma (HCC)[place]/11/2019  ? Nonsustained ventricular tachycardia (HCC)[place]/29/2021  ? Pain of left hip joint 05/29/2020  ? Personal history of pulmonary embolism 12/19/2017  ? Pneumonia of both lungs due to infectious organism   ? Pulmonary hypertension due to thromboembolism (HCC)[place]/28/2019  ? See echo 12/27/17 with nl PAS vs CTa chest 02/23/18   ? S/P angioplasty with stent 10/04/20 DES to LCX  10/26/2020  ? Sinus bradycardia 12/22/2017  ? SOB (shortness of breath)   ? Solitary pulmonary nodule on lung CT 03/08/2018  ? CT 12/04/17 1.0 x 0.8 x 0.7 cm nodular opacity in the RUL vs not seen 03/16/10 posterior segment of the right upper lobe. .SpiMarland Kitchenometry 03/08/2018    FEV1 3.86 (108%)  Ratio 96 s prior rx  - PET  03/13/18   Low grade c/w adenoca > rec T surgery eval    ? STEMI (ST elevation myocardial infarction) (HCC)[place]/23/2022  ? Urinary  retention 10/26/2020  ? Vestibular schwannoma ([place]) 08/14/2020  ?  ?Past Surgical History:  ?Procedure Laterality Date  ? APPENDECTOMY    ? BIV ICD INSERTION CRT-D N/A 11/03/2021  ? Procedure: BIV ICD INSERTION CRT-D;  Surgeon: Constance Haw, MD;  Location: [place] CV LAB;  Service: Cardiovascular;  Laterality: N/A;  ? CARDIAC CATHETERIZATION    ? CORONARY ANGIOPLASTY WITH STENT PLACEMENT Left 04/12/2021  ? LAD stent placement  ? CORONARY STENT INTERVENTION N/A 04/12/2021  ? Procedure: CORONARY STENT INTERVENTION;  Surgeon: Nelva Bush, MD;  Location: [place] CV LAB;  Service: Cardiovascular;  Laterality: N/A;  ? CORONARY/GRAFT ACUTE MI REVASCULARIZATION N/A 10/04/2020  ?  Procedure: Coronary/Graft Acute MI Revascularization;  Surgeon: Nelva Bush, MD;  Location: [place] CV LAB;  Service: Cardiovascular;  Laterality: N/A;  ? HERNIA REPAIR    ? INTRAVASCULAR PRESSURE WIRE/FFR STUDY N/A 04/12/2021  ? Procedure: INTRAVASCULAR PRESSURE WIRE/FFR STUDY;  Surgeon: Nelva Bush, MD;  Location: [place] CV LAB;  Service: Cardiovascular;  Laterality: N/A;  ? INTRAVASCULAR ULTRASOUND/IVUS N/A 04/12/2021  ? Procedure: Intravascular Ultrasound/IVUS;  Surgeon: Nelva Bush, MD;  Location: [place] CV LAB;  Service: Cardiovascular;  Laterality: N/A;  ? LEFT HEART CATH AND CORONARY ANGIOGRAPHY N/A 10/04/2020  ? Procedure: LEFT HEART CATH AND CORONARY ANGIOGRAPHY;  Surgeon: Nelva Bush, MD;  Location: [place] CV LAB;  Service: Cardiovascular;  Laterality: N/A;  ? RIGHT/LEFT HEART CATH AND CORONARY ANGIOGRAPHY N/A 04/12/2021  ? Procedure: RIGHT/LEFT HEART CATH AND CORONARY ANGIOGRAPHY;  Surgeon: Nelva Bush, MD;  Location: [place] CV LAB;  Service: Cardiovascular;  Laterality: N/A;  ?  ?No Known Allergies  ?Social History  ? ?Tobacco Use  ? Smoking status: Former  ?  Packs/day: 0.50  ?  Years: 5.00  ?  Pack years: 2.50  ?  Types: Cigarettes  ?  Quit date: 09/12/1968  ?  Years since quitting: 53.3  ? Smokeless tobacco: Never  ?Substance Use Topics  ? Alcohol use: Yes  ?  Comment: ONE PER WEEK  ?  ?Family History  ?Problem Relation Age of Onset  ? Diabetes Mother   ? Brain cancer Sister   ? Melanoma Brother   ?     Radiation Exposure  ? Leukemia Brother   ?     Agent Orange  ? ?  ?Review of Systems ? ?Positive ROS: As above ? ?All other systems have been reviewed and were otherwise negative with the exception of those mentioned in the HPI and as above. ? ?Objective: ?Vital signs in last 24 hours: ?Temp:  [98.3 ?F (36.8 ?C)] 98.3 ?F (36.8 ?C) (05/12 2245) ?Pulse Rate:  [66] 66 (05/12 2245) ?Resp:  [16] 16 (05/12 2245) ?BP: (136-150)/(86-96) 150/96 (05/12 2346) ?SpO2:  [96  %] 96 % (05/12 2245) ? ?General Appearance: Alert, cooperative, no distress, appears stated age ?Head: Normocephalic, without obvious abnormality, atraumatic ?Eyes: PERRL, conjunctiva/corneas clear, EOM's intact    ?Throat: benign ?Neck: In cervical collar ?Lungs:  respirations unlabored ?Heart: Regular rate and rhythm ?Abdomen: Soft ?Extremities: Extremities normal, atraumatic, no cyanosis or edema ?Pulses: 2+ and symmetric all extremities ?Skin: Skin color, texture, turgor normal, no rashes or lesions ? ?NEUROLOGIC:  ? ?Mental status: A&O x4, no aphasia, good attention span, Memory and fund of knowledge.  Be okay to have some amnesia for the event ?Motor Exam - grossly normal, normal tone and bulk on the left he has some ataxia on the right but his strength is 5 out of  5. ?Sensory Exam -some decrease in gross sensation on the right ?Reflexes: symmetric, no pathologic reflexes, No Hoffman's, No clonus ?Coordination -decreased on the right ?Gait -not tested ?Balance -not tested ?Cranial Nerves: ?I: smell Not tested  ?II: visual acuity  OS: na    OD: na  ?II: visual fields Full to confrontation  ?II: pupils Equal, round, reactive to light  ?III,VII: ptosis None  ?III,IV,VI: extraocular muscles  Full ROM  ?V: mastication Normal  ?V: facial light touch sensation  Normal  ?V,VII: corneal reflex  Present  ?VII: facial muscle function - upper  Normal  ?VII: facial muscle function - lower Normal  ?VIII: hearing Not tested  ?IX: soft palate elevation  Normal  ?IX,X: gag reflex Present  ?XI: trapezius strength  5/5  ?XI: sternocleidomastoid strength 5/5  ?XI: neck flexion strength  5/5  ?XII: tongue strength  Normal  ? ? ?Data Review ?Lab Results  ?Component Value Date  ? WBC 11.5 (H) 01/21/2022  ? HGB 12.2 (L) 01/21/2022  ? HCT 36.0 (L) 01/21/2022  ? MCV 94.7 01/21/2022  ? PLT 259 01/21/2022  ? ?Lab Results  ?Component Value Date  ? NA 138 01/21/2022  ? K 4.4 01/21/2022  ? CL 109 01/21/2022  ? CO2 19 (L) 10/11/2021  ? BUN  32 (H) 01/21/2022  ? CREATININE 2.40 (H) 01/21/2022  ? GLUCOSE 124 (H) 01/21/2022  ? ?Lab Results  ?Component Value Date  ? INR 1.2 10/05/2020  ? ? ?Radiology: DG Lumbar Spine Complete ? ?Result Date: 5/1

## 2022-01-22 NOTE — Assessment & Plan Note (Addendum)
Currently euvolemic.  Continue guideline directed medical therapy.  He has been unable to tolerate Entresto according to cardiology notes. ? ?Interestingly, EKG from ER looks like afib. But has have BiV pacermaker. He should have afib if he is BiV paced.  Will ask cards to look at it. He may need to have his BiV PPM interrogated to see what is really happening with his rhythm. I have sent epic message for cards consult. ?

## 2022-01-22 NOTE — Plan of Care (Signed)

## 2022-01-22 NOTE — Progress Notes (Signed)
No change in his exam this morning.  He has good strength in the right upper extremity but is ataxic with his movements.  He is in a cervical collar.  He states he has some numbness in the right arm the right leg feels fine and the right face feels fine.  Unfortunately because of his defibrillator he cannot get an MRI over the weekend.  We recommend MRI of the cervical spine.  Continue collar until MRI is done. ?

## 2022-01-22 NOTE — Subjective & Objective (Signed)
CC: fall at home ?HPI: ?76 year old male with a history of CKD stage III, history of cardiac arrest back in January 2022 secondary to STEMI, status post recent BiV pacer placement, urinary tension with chronic indwelling Foley, history of PE no longer on anticoagulation, history of multiple meningiomas treated at [facility] who presents to the ER today as a trauma.  Patient states that he was at his friend's house with his wife.  He states that they were enjoying the evening and were leaving this friend's house.  As he was walking out the door, he stepped out onto the walkway leading up to their house.  He describes it as an elevated brick sidewalk.  This is about 18 inches above the ground.  He states that he tried to step out of the way to let people out the front door and stepped off the sidewalk.  He fell backwards and hit the back of his head against the house.  He states that he hyperflexed his neck.  He also struck his right shoulder against the house.  He states that he was unable to move his right arm. ? ?EMS was called.  They were able to get him onto a stretcher.  He was brought to the ER for evaluation. ? ?CT head showed a large meningioma with possible vasogenic edema.  Neurosurgery and neurology were consulted. ? ?Neurosurgery felt that the patient should receive IV Decadron and an MRI. ? ?Patient does have an MRI compatible pacemaker and leads however patient needs a nurse and a pacemaker tech that is able to reprogram the patient's pacemaker after his MRI.  This is not available on the weekend. ? ?[facility] hospitalist consulted for admission. ? ? ?

## 2022-01-22 NOTE — Progress Notes (Signed)
Cardiology consulted for possible afib, I have personally reviewed his EKG and Telemetry, both showed sinus rhythm with PACs. Dr. Audie Box also reviewed his EKG and confirmed he is not in afib. Will cancel the cardiology consult for now. ? ? ?

## 2022-01-22 NOTE — ED Notes (Signed)
Neurosurgery at pt bedside ?

## 2022-01-22 NOTE — Progress Notes (Signed)
Imaging reviewed: MRI C-spine shows no injury to the cord or spine, no ligamentous injury. Nothing to  his current issues. Ok to d/c c-collar. ? ?Sxs/signs may be related to a brachial plexus stretch injury from the fall sideways against the wall? Or maybe a cord concussion? Or a brain concussion in the face of cerebral edema from meningioma? Difficult to know, but he's much better no matter the etiology. ? ?MRI BRAIN shows [facility]. There is some edema associated with the falcine tumor, but I doubt this is post-traumatic or related to his symptoms/ signs.  ? ?A steroid wean over a few days would be reasonable. PT/OT is reasonable. ? ?Can follow up with his [facility] neurosurgeon or with Dr Arnoldo Morale whom he has seen in the past. ?

## 2022-01-22 NOTE — Assessment & Plan Note (Signed)
Vasogenic edema on CT head.  Pt states he had gamma knife radiation at [facility] in October 2022. He is actively followed by [facility]. He states he was suppose to go back and see radiation Onc in a few weeks.  Neurosurgery wants pt to have MRI.  Unfortunately, patient will need to wait till Monday until a nurse, pacemaker tech can be available with the patient in the MRI machine in case there is a malfunction with his pacemaker after MRI.  Patient received 10 mg of IV Decadron per neurosurgery recommendation.  Neurology is also following along. ?

## 2022-01-22 NOTE — ED Notes (Addendum)
PT c-collar taken off, this tech was given OK by current EDP's ?

## 2022-01-22 NOTE — Assessment & Plan Note (Signed)
Chronic. 

## 2022-01-22 NOTE — ED Notes (Signed)
Pt arrived in CT for CT angio head/neck  ?

## 2022-01-22 NOTE — ED Notes (Signed)
Admitting physician at pt bedside

## 2022-01-22 NOTE — ED Notes (Signed)
ED provider in CT with this RN, Rapid Response RN, and neurologist. ED provider informed of I-Stat creatinine 2.4 in regards to ordered CT angio study. Per provider, will use IV contrast for CT ?

## 2022-01-22 NOTE — Code Documentation (Signed)
Responded to Code Stroke called on patient already in ED for fall. Code Stroke called at 2329 for R sided numbness, LSN-[id number]. CBG-124, NIH-5, CT head-negative for bleed, CTA-no LVO. TNK not given-h/o meningioma. Plan stroke work-up.  ?

## 2022-01-22 NOTE — Assessment & Plan Note (Signed)
Patient no longer on anticoagulation.  According the chart, the patient refused this continue taking this about 2 years ago. ?

## 2022-01-22 NOTE — ED Provider Notes (Signed)
?[facility] ?Provider Note ? ? ?CSN: [id number] ?Arrival date & time: 01/21/22  2243 ? ?An emergency department physician performed an initial assessment on this suspected stroke patient at 2329. ? ?History ? ?Chief Complaint  ?Patient presents with  ? Fall  ? ? ?Timothy Moran is a 76 y.o. male. ? ?The history is provided by the patient and the EMS personnel.  ?Fall ?This is a new problem. The current episode started 1 to 2 hours ago. The problem occurs constantly. The problem has not changed since onset.Pertinent negatives include no chest pain, no abdominal pain and no shortness of breath. Associated symptoms comments: Neck pain and RUE/RLE weakness . Nothing aggravates the symptoms. Nothing relieves the symptoms. He has tried nothing for the symptoms. The treatment provided moderate relief.  ?Patient s/p cardiac arrest with defibrillator presents with weakness post fall.  RUE and difficult standing and doing heel to shin.  ?  ? ?Home Medications ?Prior to Admission medications   ?Medication Sig Start Date End Date Taking? Authorizing Provider  ?aspirin EC 81 MG tablet Take 81 mg by mouth daily. Swallow whole.    [provider]  ?atorvastatin (LIPITOR) 80 MG tablet Take 1 tablet (80 mg total) by mouth daily. 12/10/21   Park Liter, MD  ?B Complex-C (SUPER B COMPLEX PO) Take 1 tablet by mouth daily. Unknown strength    [provider]  ?clopidogrel (PLAVIX) 75 MG tablet Take 4 tablets (300 mg total) day one, then take 1 tablet (75 mg total) daily 10/08/21   Camnitz, Ocie Doyne, MD  ?finasteride (PROSCAR) 5 MG tablet Take 5 mg by mouth daily. ?Patient not taking: Reported on 10/01/2021 07/12/21   [provider]  ?hydrALAZINE (APRESOLINE) 10 MG tablet Take 1 tablet (10 mg total) by mouth 3 (three) times daily. 11/08/21   Park Liter, MD  ?isosorbide dinitrate (ISORDIL) 30 MG tablet Take 1 tablet (30 mg total) by mouth 2 (two) times  daily. 12/10/21   Park Liter, MD  ?melatonin 5 MG TABS Take 1 tablet (5 mg total) by mouth at bedtime. 11/06/20   Bary Leriche, PA-C  ?metoprolol succinate (TOPROL-XL) 50 MG 24 hr tablet TAKE 1 TABLET DAILY, WITH  OR IMMEDIATELY FOLLOWING A MEAL ?Patient taking differently: Take 50 mg by mouth daily with breakfast. 04/29/21   Park Liter, MD  ?Multiple Vitamin (MULTIVITAMIN WITH MINERALS) TABS tablet Take 1 tablet by mouth daily. ?Patient taking differently: Take 1 tablet by mouth daily. Unknown strength 11/07/20   Love, Ivan Anchors, PA-C  ?valsartan (DIOVAN) 160 MG tablet TAKE 1 TABLET DAILY ?Patient taking differently: Take 80 mg by mouth in the morning. 07/21/21   Park Liter, MD  ?   ? ?Allergies    ?Patient has no known allergies.   ? ?Review of Systems   ?Review of Systems  ?Constitutional:  Negative for fever.  ?HENT:  Negative for congestion.   ?Eyes:  Negative for redness.  ?Respiratory:  Negative for shortness of breath.   ?Cardiovascular:  Negative for chest pain.  ?Gastrointestinal:  Negative for abdominal pain.  ?Genitourinary:  Negative for difficulty urinating.  ?Musculoskeletal:  Negative for neck pain and neck stiffness.  ?Skin:  Negative for pallor.  ?Neurological:  Negative for facial asymmetry.  ?Psychiatric/Behavioral:  Negative for agitation.   ?All other systems reviewed and are negative. ? ?Physical Exam ?Updated Vital Signs ?BP 127/73   Pulse 61   Temp 98.3 ?F (36.8 ?C) (  Oral)   Resp 16   SpO2 96%  ?Physical Exam ?Vitals and nursing note reviewed.  ?Constitutional:   ?   General: He is not in acute distress. ?   Appearance: Normal appearance.  ?HENT:  ?   Head: Normocephalic and atraumatic.  ?   Nose: Nose normal.  ?   Mouth/Throat:  ?   Mouth: Mucous membranes are moist.  ?   Pharynx: Oropharynx is clear.  ?Eyes:  ?   Extraocular Movements: Extraocular movements intact.  ?   Conjunctiva/sclera: Conjunctivae normal.  ?   Pupils: Pupils are equal, round, and reactive to  light.  ?Cardiovascular:  ?   Rate and Rhythm: Normal rate and regular rhythm.  ?   Pulses: Normal pulses.  ?   Heart sounds: Normal heart sounds.  ?Pulmonary:  ?   Effort: Pulmonary effort is normal.  ?   Breath sounds: Normal breath sounds.  ?Abdominal:  ?   General: Bowel sounds are normal.  ?   Palpations: Abdomen is soft.  ?   Tenderness: There is no abdominal tenderness. There is no guarding.  ?Musculoskeletal:     ?   General: Normal range of motion.  ?   Cervical back: Normal range of motion and neck supple.  ?Skin: ?   General: Skin is warm and dry.  ?   Capillary Refill: Capillary refill takes less than 2 seconds.  ?Neurological:  ?   Mental Status: He is alert.  ?   Sensory: Sensory deficit present.  ?   Motor: Weakness present.  ?   Deep Tendon Reflexes: Reflexes normal.  ?Psychiatric:     ?   Mood and Affect: Mood normal.     ?   Behavior: Behavior normal.  ? ? ?ED Results / Procedures / Treatments   ?Labs ?(all labs ordered are listed, but only abnormal results are displayed) ?Results for orders placed or performed during the hospital encounter of 01/21/22  ?Comprehensive metabolic panel  ?Result Value Ref Range  ? Sodium 136 135 - 145 mmol/L  ? Potassium 4.3 3.5 - 5.1 mmol/L  ? Chloride 109 98 - 111 mmol/L  ? CO2 19 (L) 22 - 32 mmol/L  ? Glucose, Bld 128 (H) 70 - 99 mg/dL  ? BUN 31 (H) 8 - 23 mg/dL  ? Creatinine, Ser 2.20 (H) 0.61 - 1.24 mg/dL  ? Calcium 9.0 8.9 - 10.3 mg/dL  ? Total Protein 7.3 6.5 - 8.1 g/dL  ? Albumin 4.1 3.5 - 5.0 g/dL  ? AST 28 15 - 41 U/L  ? ALT 22 0 - 44 U/L  ? Alkaline Phosphatase 63 38 - 126 U/L  ? Total Bilirubin 0.8 0.3 - 1.2 mg/dL  ? GFR, Estimated 30 (L) >60 mL/min  ? Anion gap 8 5 - 15  ?CBC  ?Result Value Ref Range  ? WBC 11.5 (H) 4.0 - 10.5 K/uL  ? RBC 3.93 (L) 4.22 - 5.81 MIL/uL  ? Hemoglobin 12.1 (L) 13.0 - 17.0 g/dL  ? HCT 37.2 (L) 39.0 - 52.0 %  ? MCV 94.7 80.0 - 100.0 fL  ? MCH 30.8 26.0 - 34.0 pg  ? MCHC 32.5 30.0 - 36.0 g/dL  ? RDW 13.2 11.5 - 15.5 %  ?  Platelets 259 150 - 400 K/uL  ? nRBC 0.0 0.0 - 0.2 %  ?Ethanol  ?Result Value Ref Range  ? Alcohol, Ethyl (B) <10 <10 mg/dL  ?Lactic acid, plasma  ?Result Value Ref Range  ?  Lactic Acid, Venous 1.6 0.5 - 1.9 mmol/L  ?Protime-INR  ?Result Value Ref Range  ? Prothrombin Time 13.7 11.4 - 15.2 seconds  ? INR 1.1 0.8 - 1.2  ?APTT  ?Result Value Ref Range  ? aPTT 30 24 - 36 seconds  ?Differential  ?Result Value Ref Range  ? Neutrophils Relative % 64 %  ? Neutro Abs 7.6 1.7 - 7.7 K/uL  ? Lymphocytes Relative 24 %  ? Lymphs Abs 2.7 0.7 - 4.0 K/uL  ? Monocytes Relative 8 %  ? Monocytes Absolute 0.9 0.1 - 1.0 K/uL  ? Eosinophils Relative 2 %  ? Eosinophils Absolute 0.2 0.0 - 0.5 K/uL  ? Basophils Relative 1 %  ? Basophils Absolute 0.1 0.0 - 0.1 K/uL  ? Immature Granulocytes 1 %  ? Abs Immature Granulocytes 0.06 0.00 - 0.07 K/uL  ?I-Stat Chem 8, ED  ?Result Value Ref Range  ? Sodium 138 135 - 145 mmol/L  ? Potassium 4.4 3.5 - 5.1 mmol/L  ? Chloride 109 98 - 111 mmol/L  ? BUN 32 (H) 8 - 23 mg/dL  ? Creatinine, Ser 2.40 (H) 0.61 - 1.24 mg/dL  ? Glucose, Bld 124 (H) 70 - 99 mg/dL  ? Calcium, Ion 1.19 1.15 - 1.40 mmol/L  ? TCO2 21 (L) 22 - 32 mmol/L  ? Hemoglobin 12.2 (L) 13.0 - 17.0 g/dL  ? HCT 36.0 (L) 39.0 - 52.0 %  ?Sample to Blood Bank  ?Result Value Ref Range  ? Blood Bank Specimen SAMPLE AVAILABLE FOR TESTING   ? Sample Expiration    ?  01/22/2022,2359 ?Performed at [facility], [place] [address], [place], [place] [address] ?  ? ?DG Lumbar Spine Complete ? ?Result Date: 01/21/2022 ?CLINICAL DATA:  Fall, low back pain. EXAM: LUMBAR SPINE - COMPLETE 4+ VIEW COMPARISON:  08/26/2021. FINDINGS: There is no evidence of lumbar spine fracture. Alignment is normal. Intervertebral disc space narrowing is present at L5-S1. Moderate, degenerative endplate changes and facet arthropathy is noted at multiple levels in the lumbar spine and lower thoracic spine. Total hip arthroplasty changes are noted on the left. There are mild  degenerative changes at the right hip. Aortic atherosclerosis is noted. There is evidence of prior hernia repair in the right lower quadrant. IMPRESSION: Moderate lumbar spondylosis.  No acute osseous abnormality is see

## 2022-01-22 NOTE — H&P (Signed)
?History and Physical  ? ? ?Timothy Moran QZR:[id number] DOB: 23-Oct-1945 DOA: 01/21/2022 ? ?DOS: the patient was seen and examined on 01/21/2022 ? ?PCP: Cari Caraway, MD  ? ?Patient coming from: Home ? ?I have personally briefly reviewed patient's old medical records in [facility] ? ?CC: fall at home ?HPI: ?76 year old male with a history of CKD stage III, history of cardiac arrest back in January 2022 secondary to STEMI, status post recent BiV pacer placement, urinary tension with chronic indwelling Foley, history of PE no longer on anticoagulation, history of multiple meningiomas treated at [facility] who presents to the ER today as a trauma.  Patient states that he was at his friend's house with his wife.  He states that they were enjoying the evening and were leaving this friend's house.  As he was walking out the door, he stepped out onto the walkway leading up to their house.  He describes it as an elevated brick sidewalk.  This is about 18 inches above the ground.  He states that he tried to step out of the way to let people out the front door and stepped off the sidewalk.  He fell backwards and hit the back of his head against the house.  He states that he hyperflexed his neck.  He also struck his right shoulder against the house.  He states that he was unable to move his right arm. ? ?EMS was called.  They were able to get him onto a stretcher.  He was brought to the ER for evaluation. ? ?CT head showed a large meningioma with possible vasogenic edema.  Neurosurgery and neurology were consulted. ? ?Neurosurgery felt that the patient should receive IV Decadron and an MRI. ? ?Patient does have an MRI compatible pacemaker and leads however patient needs a nurse and a pacemaker tech that is able to reprogram the patient's pacemaker after his MRI.  This is not available on the weekend. ? ?[facility] hospitalist consulted for admission. ? ?  ? ?ED Course: CT head with meningioma with vasogenic  edema ? ?Review of Systems:  ?Review of Systems  ?Constitutional: Negative.   ?HENT: Negative.    ?Eyes: Negative.   ?Respiratory: Negative.    ?Gastrointestinal: Negative.   ?Genitourinary: Negative.   ?Musculoskeletal:  Positive for falls, joint pain and neck pain.  ?Neurological:  Positive for headaches.  ?Endo/Heme/Allergies: Negative.   ?Psychiatric/Behavioral: Negative.    ?All other systems reviewed and are negative. ? ?Past Medical History:  ?Diagnosis Date  ? Acute blood loss anemia 10/05/2020  ? Acute respiratory failure ([place]), hypothermia therapy, vent - extubated 10/06/20   ? AKI (acute kidney injury) ([place]) 10/05/2020  ? Anoxic brain injury ([place]) 10/26/2020  ? Arrhythmia   ? Ascending aortic aneurysm ([place]) 01/29/2018  ? a. 2019 4.3 cm by echo; b. 02/2021 Echo: Ao root 39m.  ? Benign brain tumor ([place]   ? Benign neoplasm of brain ([place] 12/19/2017  ? Cardiac arrest with ventricular fibrillation ([place]   ? Chest pain 12/04/2017  ? Chronic kidney disease, stage III (moderate) ([place] 12/19/2017  ? CKD (chronic kidney disease), stage III - IV ([place]   ? Coronary Artery Disease   ? a. 13/5456Inf STEMI complicated by cardiac arrest/PCI: LAD 50p/m, LCX 100d (2.5 x 30 Resolute Onyx DES); b. 04/2021 PCI: 04/2021 LM nl, LAD 50p/819m2.5x30 Onyx Frontier DES), LCX 25p, 5078mFR 0.98), 40d ISR (RFR 0.98), OM2 25, RCA mild diff dzs.  ? Diabetes mellitus, type 2 (  [place]) 10/06/2020  ? 10/06/20 A1C 7%  ? Dizziness 10/11/2019  ? Encounter for imaging study to confirm orogastric (OG) tube placement   ? Hematuria 10/26/2020  ? HFrEF (heart failure with reduced ejection fraction) ([place])   ? a. 11/2020 Echo: EF 25-30%; b. 02/2021 Echo: EF 25-30%, glob HK. Mild LVH. Nl RV fxn. Triv AI. Ao root 59m.  ? History of pulmonary embolus (PE)   ? HLD (hyperlipidemia) 10/26/2020  ? Hypertension   ? Iron deficiency anemia rec'd IV iron 10/26/2020  ? Ischemic cardiomyopathy   ? a. 11/2020 Echo: EF 25-30%; b. 02/2021 Echo: EF 25-30%, glob HK.  ? Left  bundle branch block 12/19/2017  ? Meningioma ([place] 08/14/2020  ? Nonsustained ventricular tachycardia ([place] 10/11/2019  ? Pain of left hip joint 05/29/2020  ? Personal history of pulmonary embolism 12/19/2017  ? Pneumonia of both lungs due to infectious organism   ? Pulmonary hypertension due to thromboembolism ([place] 03/09/2018  ? See echo 12/27/17 with nl PAS vs CTa chest 02/23/18   ? S/P angioplasty with stent 10/04/20 DES to LCX  10/26/2020  ? Sinus bradycardia 12/22/2017  ? SOB (shortness of breath)   ? Solitary pulmonary nodule on lung CT 03/08/2018  ? CT 12/04/17 1.0 x 0.8 x 0.7 cm nodular opacity in the RUL vs not seen 03/16/10 posterior segment of the right upper lobe. .Mar[place] Kitchenpirometry 03/08/2018    FEV1 3.86 (108%)  Ratio 96 s prior rx  - PET  03/13/18   Low grade c/w adenoca > rec T surgery eval    ? STEMI (ST elevation myocardial infarction) ([place] 10/04/2020  ? Urinary retention 10/26/2020  ? Vestibular schwannoma ([place] 08/14/2020  ? ? ?Past Surgical History:  ?Procedure Laterality Date  ? APPENDECTOMY    ? BIV ICD INSERTION CRT-D N/A 11/03/2021  ? Procedure: BIV ICD INSERTION CRT-D;  Surgeon: CConstance Haw MD;  [facility];  Service: Cardiovascular;  Laterality: N/A;  ? CARDIAC CATHETERIZATION    ? CORONARY ANGIOPLASTY WITH STENT PLACEMENT Left 04/12/2021  ? LAD stent placement  ? CORONARY STENT INTERVENTION N/A 04/12/2021  ? Procedure: CORONARY STENT INTERVENTION;  Surgeon: ENelva Bush MD;  [facility];  Service: Cardiovascular;  Laterality: N/A;  ? CORONARY/GRAFT ACUTE MI REVASCULARIZATION N/A 10/04/2020  ? Procedure: Coronary/Graft Acute MI Revascularization;  Surgeon: ENelva Bush MD;  [facility];  Service: Cardiovascular;  Laterality: N/A;  ? HERNIA REPAIR    ? INTRAVASCULAR PRESSURE WIRE/FFR STUDY N/A 04/12/2021  ? Procedure: INTRAVASCULAR PRESSURE WIRE/FFR STUDY;  Surgeon: ENelva Bush MD;  [facility];  Service:  Cardiovascular;  Laterality: N/A;  ? INTRAVASCULAR ULTRASOUND/IVUS N/A 04/12/2021  ? Procedure: Intravascular Ultrasound/IVUS;  Surgeon: ENelva Bush MD;  [facility];  Service: Cardiovascular;  Laterality: N/A;  ? LEFT HEART CATH AND CORONARY ANGIOGRAPHY N/A 10/04/2020  ? Procedure: LEFT HEART CATH AND CORONARY ANGIOGRAPHY;  Surgeon: ENelva Bush MD;  [facility];  Service: Cardiovascular;  Laterality: N/A;  ? RIGHT/LEFT HEART CATH AND CORONARY ANGIOGRAPHY N/A 04/12/2021  ? Procedure: RIGHT/LEFT HEART CATH AND CORONARY ANGIOGRAPHY;  Surgeon: ENelva Bush MD;  [facility];  Service: Cardiovascular;  Laterality: N/A;  ? ? ? reports that he quit smoking about 53 years ago. His smoking use included cigarettes. He has a 2.50 pack-year smoking history. He has never used smokeless tobacco. He reports current alcohol use. He reports that he does not use  drugs. ? ?No Known Allergies ? ?Family History  ?Problem Relation Age of Onset  ? Diabetes Mother   ? Brain cancer Sister   ? Melanoma Brother   ?     Radiation Exposure  ? Leukemia Brother   ?     Agent Orange  ? ? ?Prior to Admission medications   ?Medication Sig Start Date End Date Taking? Authorizing Provider  ?aspirin EC 81 MG tablet Take 81 mg by mouth daily. Swallow whole.   Yes [provider]  ?atorvastatin (LIPITOR) 80 MG tablet Take 1 tablet (80 mg total) by mouth daily. 12/10/21   Park Liter, MD  ?B Complex-C (SUPER B COMPLEX PO) Take 1 tablet by mouth daily. Unknown strength    [provider]  ?cephALEXin (KEFLEX) 500 MG capsule Take 500 mg by mouth See admin instructions. Bid x 5 days 01/03/22   [provider]  ?clopidogrel (PLAVIX) 75 MG tablet Take 4 tablets (300 mg total) day one, then take 1 tablet (75 mg total) daily 10/08/21   Camnitz, Ocie Doyne, MD  ?finasteride (PROSCAR) 5 MG tablet Take 5 mg by mouth daily. ?Patient not taking: Reported on 10/01/2021  07/12/21   [provider]  ?hydrALAZINE (APRESOLINE) 10 MG tablet Take 1 tablet (10 mg total) by mouth 3 (three) times daily. 11/08/21   Park Liter, MD  ?isosorbide dinitrate (ISORDIL) 30 MG tablet Take 1

## 2022-01-22 NOTE — Assessment & Plan Note (Signed)
Stable

## 2022-01-22 NOTE — Assessment & Plan Note (Signed)
Patient's had a chronic indwelling Foley catheter since January 2022.  He has been followed by urology as an outpatient.  Patient states that he has been unable to get a prostatectomy due to his cardiac arrest and his reduced ejection fraction. ?

## 2022-01-22 NOTE — Assessment & Plan Note (Addendum)
Admit to telemetry bed. Needs MRI c-spine to rule out ligamentous injury. Neurosurgery has seen patient. Remains in [place] collar. ?

## 2022-01-22 NOTE — Progress Notes (Signed)
Neurology Progress Note ? ? ?S:// ?Seen and examined. ?Reports somewhat improved RUE strength but still feels that he can not control that arm well and is clumsy with that. ?C-collar in place. ?[place] consulting. Recommend MRI c-sp prior to considering collar removal. ?Cardiology curbsided for possible new onset Afib - they do not think he has Afib. Eval reveals sinus rhythm with PACs ? ? ?O:// ?Current vital signs: ?BP 113/79   Pulse (!) 58   Temp 98.3 ?F (36.8 ?C) (Oral)   Resp 18   Ht '6\' 1"'$  (1.854 m)   Wt 99.8 kg   SpO2 96%   BMI 29.03 kg/m?  ?Vital signs in last 24 hours: ?Temp:  [98.3 ?F (36.8 ?C)] 98.3 ?F (36.8 ?C) (05/12 2245) ?Pulse Rate:  [56-67] 58 (05/13 1130) ?Resp:  [10-19] 18 (05/13 1130) ?BP: (94-150)/(49-96) 113/79 (05/13 1130) ?SpO2:  [90 %-99 %] 96 % (05/13 1130) ?Weight:  [99.8 kg] 99.8 kg (05/13 0203) ?GENERAL: Awake, alert in NAD ?HEENT: c-collar in place ?LUNGS - Clear to auscultation bilaterally with no wheezes ?CV - S1S2 RRR, no m/r/g, equal pulses bilaterally. ?ABDOMEN - Soft, nontender, nondistended with normoactive BS ?NEURO:  ?Mental Status: AA&Ox3  ?Language: speech is clear.  Naming, repetition, fluency, and comprehension intact. ?Cranial Nerves: PERRL . EOMI, visual fields full, no facial asymmetry, facial sensation intact, hearing intact, tongue/uvula/soft palate midline, normal sternocleidomastoid and trapezius muscle strength. No evidence of tongue atrophy or fibrillations ?Motor: RUE seems nearly 5/5 but subtle weakness on elbow flexion 4+/5. RLE also drifts in comparison to left LE which does not drift vertically. LUE is also full strength with no drift. ?Tone: is normal and bulk is normal ?Sensation- subjective numbness RUE ?Coordination: grossly ataxic in RUE ?Gait- deferred ? ?Medications ? ?Current Facility-Administered Medications:  ?  iohexol (OMNIPAQUE) 350 MG/ML injection 100 mL, 100 mL, Intravenous, Once PRN, Donnetta Simpers, MD ? ?Current Outpatient Medications:   ?  aspirin EC 81 MG tablet, Take 81 mg by mouth daily. Swallow whole., Disp: , Rfl:  ?  atorvastatin (LIPITOR) 80 MG tablet, Take 1 tablet (80 mg total) by mouth daily., Disp: 90 tablet, Rfl: 3 ?  B Complex-C (SUPER B COMPLEX PO), Take 1 tablet by mouth daily. Unknown strength, Disp: , Rfl:  ?  cephALEXin (KEFLEX) 500 MG capsule, Take 500 mg by mouth See admin instructions. Bid x 5 days, Disp: , Rfl:  ?  clopidogrel (PLAVIX) 75 MG tablet, Take 4 tablets (300 mg total) day one, then take 1 tablet (75 mg total) daily, Disp: 30 tablet, Rfl: 3 ?  finasteride (PROSCAR) 5 MG tablet, Take 5 mg by mouth daily. (Patient not taking: Reported on 10/01/2021), Disp: , Rfl:  ?  hydrALAZINE (APRESOLINE) 10 MG tablet, Take 1 tablet (10 mg total) by mouth 3 (three) times daily., Disp: 180 tablet, Rfl: 3 ?  isosorbide dinitrate (ISORDIL) 30 MG tablet, Take 1 tablet (30 mg total) by mouth 2 (two) times daily., Disp: 180 tablet, Rfl: 2 ?  melatonin 5 MG TABS, Take 1 tablet (5 mg total) by mouth at bedtime., Disp: 30 tablet, Rfl: 0 ?  metoprolol succinate (TOPROL-XL) 50 MG 24 hr tablet, TAKE 1 TABLET DAILY, WITH  OR IMMEDIATELY FOLLOWING A MEAL (Patient taking differently: Take 50 mg by mouth daily with breakfast.), Disp: 90 tablet, Rfl: 3 ?  Multiple Vitamin (MULTIVITAMIN WITH MINERALS) TABS tablet, Take 1 tablet by mouth daily. (Patient taking differently: Take 1 tablet by mouth daily. Unknown strength), Disp: , Rfl:  ?  valsartan (DIOVAN) 160 MG tablet, TAKE 1 TABLET DAILY (Patient taking differently: Take 80 mg by mouth in the morning.), Disp: 90 tablet, Rfl: 3 ?Labs ?CBC ?   ?Component Value Date/Time  ? WBC 11.5 (H) 01/21/2022 2346  ? RBC 3.93 (L) 01/21/2022 2346  ? HGB 12.2 (L) 01/21/2022 2352  ? HGB 11.9 (L) 10/11/2021 7416  ? HCT 36.0 (L) 01/21/2022 2352  ? HCT 36.2 (L) 10/11/2021 3845  ? PLT 259 01/21/2022 2346  ? PLT 288 10/11/2021 0838  ? MCV 94.7 01/21/2022 2346  ? MCV 93 10/11/2021 0838  ? MCH 30.8 01/21/2022 2346  ? MCHC  32.5 01/21/2022 2346  ? RDW 13.2 01/21/2022 2346  ? RDW 13.7 10/11/2021 0838  ? LYMPHSABS 2.7 01/21/2022 2346  ? MONOABS 0.9 01/21/2022 2346  ? EOSABS 0.2 01/21/2022 2346  ? BASOSABS 0.1 01/21/2022 2346  ? ? ?CMP  ?   ?Component Value Date/Time  ? NA 138 01/21/2022 2352  ? NA 143 10/11/2021 0838  ? K 4.4 01/21/2022 2352  ? CL 109 01/21/2022 2352  ? CO2 19 (L) 01/21/2022 2346  ? GLUCOSE 124 (H) 01/21/2022 2352  ? BUN 32 (H) 01/21/2022 2352  ? BUN 25 10/11/2021 0838  ? CREATININE 2.40 (H) 01/21/2022 2352  ? CALCIUM 9.0 01/21/2022 2346  ? PROT 7.3 01/21/2022 2346  ? PROT 7.5 06/19/2020 1016  ? ALBUMIN 4.1 01/21/2022 2346  ? ALBUMIN 4.7 06/19/2020 1016  ? AST 28 01/21/2022 2346  ? ALT 22 01/21/2022 2346  ? ALKPHOS 63 01/21/2022 2346  ? BILITOT 0.8 01/21/2022 2346  ? BILITOT 0.4 06/19/2020 1016  ? GFRNONAA 30 (L) 01/21/2022 2346  ? GFRAA 57 (L) 06/19/2020 1016  ? ?Imaging ?I have reviewed images in epic and the results pertinent to this consultation are: ?CTH and CT C-spine with no acute findings ? ?Assessment:  ?76/M with left parafalcine meningioma with vasogenic edema, h/o cardiac arrest due to STEMI s/p BiV pacemaker placement, chronic indwelling foley, h/o PE not on AC, presented after a fall after missing a 18 inch drop on sidewalk noted to be weak in RUE and RLE and also reported neck trauma. ?CT head and neck with no acute findings. ?Continues to have right sided subtle weakness and ataxia. ?Question of new A fib which cardiology has evaluated strips and do not think he has A fib. ?Localization still more likely to c-spine but a stroke should also be ruled out. Has a left high frontal/parasag meningioma with significant vasogenic edema which could explain leg weakness but sudden onset is less likely. ?Has a pacer - may get MRIs today ? ?Recommendations: ?MR brain w/o ?MR c-spine w/o ?Was started on Decadron '2mg'$  q6h - continue for now. Decision on continuation or discontinuation after imaging. ?D/W Dr.  Renne Crigler. ?-- ?Amie Portland, MD ?Neurologist ?[facility] ?Pager: [phone] ?

## 2022-01-22 NOTE — Progress Notes (Addendum)
No charge note ? ?Patient seen and examined this morning, admitted overnight, H&P reviewed and agree with the assessment and plan.  76 year old male with CKD 3, history of cardiac arrest in January 2022 due to STEMI, recent biventricular pacemaker placed February 2023, chronic urinary retention with chronic Foley, history of remote PE no longer anticoagulation, multiple meningiomas treated at [facility] came into the hospital as a trauma.  He was visiting a friend, and on his way out stepped out of the walkway and fell about 18 inches on concrete and hit his shoulder and head against the house.  Following that he felt weak on the right side.  Neurology and neurosurgery consulted.  He is still weak this morning, and ataxic on the right side.  This new neurologic deficit appeared after his fall, and there is concern for C-spine injury.  He is wearing a collar.  I have discussed with cardiology given his biventricular pacemaker, I appreciate their assistance in this matter, and it appears that we will be able to get an MRI done today.  Closely follow results, follow neurological status ? ?Shontel Santee M. Cruzita Lederer, MD, PhD ?[facility] ? ?Between 7 am - 7 pm you can contact me via Amion (for emergencies) or Securechat (non urgent matters).  ?I am not available 7 pm - 7 am, please contact night coverage MD/APP via Amion ?

## 2022-01-22 NOTE — Assessment & Plan Note (Signed)
Stable.  Follow-up EMP as he did have a CT scan with contrast today. ?

## 2022-01-23 DIAGNOSIS — I5022 Chronic systolic (congestive) heart failure: Secondary | ICD-10-CM | POA: Diagnosis not present

## 2022-01-23 DIAGNOSIS — W19XXXA Unspecified fall, initial encounter: Secondary | ICD-10-CM | POA: Diagnosis not present

## 2022-01-23 DIAGNOSIS — Y92009 Unspecified place in unspecified non-institutional (private) residence as the place of occurrence of the external cause: Secondary | ICD-10-CM | POA: Diagnosis not present

## 2022-01-23 LAB — BASIC METABOLIC PANEL
Anion gap: 7 (ref 5–15)
BUN: 36 mg/dL — ABNORMAL HIGH (ref 8–23)
CO2: 20 mmol/L — ABNORMAL LOW (ref 22–32)
Calcium: 8.6 mg/dL — ABNORMAL LOW (ref 8.9–10.3)
Chloride: 110 mmol/L (ref 98–111)
Creatinine, Ser: 2.26 mg/dL — ABNORMAL HIGH (ref 0.61–1.24)
GFR, Estimated: 29 mL/min — ABNORMAL LOW (ref >60)
Glucose, Bld: 123 mg/dL — ABNORMAL HIGH (ref 70–99)
Potassium: 4.3 mmol/L (ref 3.5–5.1)
Sodium: 137 mmol/L (ref 135–145)

## 2022-01-23 LAB — CBC
HCT: 30.4 % — ABNORMAL LOW (ref 39.0–52.0)
Hemoglobin: 10.3 g/dL — ABNORMAL LOW (ref 13.0–17.0)
MCH: 30.9 pg (ref 26.0–34.0)
MCHC: 33.9 g/dL (ref 30.0–36.0)
MCV: 91.3 fL (ref 80.0–100.0)
Platelets: 237 10*3/uL (ref 150–400)
RBC: 3.33 MIL/uL — ABNORMAL LOW (ref 4.22–5.81)
RDW: 13.4 % (ref 11.5–15.5)
WBC: 12 10*3/uL — ABNORMAL HIGH (ref 4.0–10.5)
nRBC: 0 % (ref 0.0–0.2)

## 2022-01-23 MED ORDER — ATORVASTATIN CALCIUM 80 MG PO TABS
80.0000 mg | ORAL_TABLET | Freq: Every day | ORAL | Status: DC
  Administered 2022-01-24 – 2022-01-25 (×2): 80 mg via ORAL
  Filled 2022-01-23 (×3): qty 1

## 2022-01-23 MED ORDER — OXYCODONE HCL 5 MG PO TABS
5.0000 mg | ORAL_TABLET | Freq: Four times a day (QID) | ORAL | Status: DC | PRN
  Administered 2022-01-23 – 2022-01-24 (×2): 5 mg via ORAL
  Filled 2022-01-23 (×2): qty 1

## 2022-01-23 MED ORDER — ACETAMINOPHEN 325 MG PO TABS: 650.0000 mg | ORAL_TABLET | Freq: Four times a day (QID) | ORAL | Status: DC | PRN

## 2022-01-23 MED ORDER — HYDROMORPHONE HCL 1 MG/ML IJ SOLN: 0.5000 mg | INTRAMUSCULAR | Status: DC | PRN

## 2022-01-23 MED ORDER — ASPIRIN EC 81 MG PO TBEC
81.0000 mg | DELAYED_RELEASE_TABLET | Freq: Every day | ORAL | Status: DC
Start: 2022-01-23 — End: 2022-01-25
  Administered 2022-01-24 – 2022-01-25 (×2): 81 mg via ORAL
  Filled 2022-01-23 (×3): qty 1

## 2022-01-23 MED ORDER — PROCHLORPERAZINE EDISYLATE 10 MG/2ML IJ SOLN: 10.0000 mg | Freq: Four times a day (QID) | INTRAMUSCULAR | Status: DC | PRN

## 2022-01-23 MED ORDER — DEXAMETHASONE 2 MG PO TABS
2.0000 mg | ORAL_TABLET | Freq: Four times a day (QID) | ORAL | Status: DC
  Administered 2022-01-23 – 2022-01-25 (×10): 2 mg via ORAL
  Filled 2022-01-23 (×13): qty 1

## 2022-01-23 MED ORDER — SENNOSIDES-DOCUSATE SODIUM 8.6-50 MG PO TABS
1.0000 | ORAL_TABLET | Freq: Every day | ORAL | Status: DC
  Administered 2022-01-23 – 2022-01-24 (×2): 1 via ORAL
  Filled 2022-01-23 (×2): qty 1

## 2022-01-23 MED ORDER — MELATONIN 5 MG PO TABS: 5.0000 mg | ORAL_TABLET | Freq: Every evening | ORAL | Status: DC | PRN

## 2022-01-23 MED ORDER — POLYETHYLENE GLYCOL 3350 17 G PO PACK: 17.0000 g | PACK | Freq: Every day | ORAL | Status: DC | PRN

## 2022-01-23 MED ORDER — CHLORHEXIDINE GLUCONATE CLOTH 2 % EX PADS
6.0000 | MEDICATED_PAD | Freq: Every day | CUTANEOUS | Status: DC
  Administered 2022-01-23 – 2022-01-25 (×3): 6 via TOPICAL

## 2022-01-23 MED ORDER — ISOSORBIDE DINITRATE 30 MG PO TABS
30.0000 mg | ORAL_TABLET | Freq: Two times a day (BID) | ORAL | Status: DC
Start: 2022-01-23 — End: 2022-01-25
  Administered 2022-01-23 – 2022-01-25 (×4): 30 mg via ORAL
  Filled 2022-01-23 (×7): qty 1

## 2022-01-23 MED ORDER — METOPROLOL SUCCINATE ER 50 MG PO TB24
50.0000 mg | ORAL_TABLET | Freq: Every day | ORAL | Status: DC
  Administered 2022-01-24 – 2022-01-25 (×2): 50 mg via ORAL
  Filled 2022-01-23 (×2): qty 1

## 2022-01-23 MED ORDER — MELATONIN 5 MG PO TABS
5.0000 mg | ORAL_TABLET | Freq: Every day | ORAL | Status: DC
Start: 2022-01-23 — End: 2022-01-25
  Administered 2022-01-23 – 2022-01-24 (×2): 5 mg via ORAL
  Filled 2022-01-23 (×2): qty 1

## 2022-01-23 NOTE — Progress Notes (Signed)
?PROGRESS NOTE ? ?Timothy Moran DGL:[id number] DOB: 02-Oct-1945 DOA: 01/21/2022 ?PCP: Cari Caraway, MD ? ? LOS: 1 day  ? ?Brief Narrative / Interim history: ?76 year old male with a history of CKD stage III, history of cardiac arrest back in January 2022 secondary to STEMI, status post recent BiV pacer placement, urinary tension with chronic indwelling Foley, history of PE no longer on anticoagulation, history of multiple meningiomas treated at [facility] who presents to the ER as a trauma. He was visiting a friend, and on his way out stepped out of the walkway and fell about 18 inches on concrete and hit his shoulder and head against the house.  Following that he felt weak on the right side.  Neurology and neurosurgery consulted.  ? ?Subjective / 24h Interval events: ?Doing well this morning, still weak on the right side ? ?Assesement and Plan: ?Principal Problem: ?  Fall at home ?Active Problems: ?  Meningioma ([place]) ?  Personal history of pulmonary embolism - no longer on anticoagulation due to pt refusal to take it anymore. ?  Chronic kidney disease, stage III (moderate) (HCC) ?  Urinary retention ?  Chronic HFrEF (heart failure with reduced ejection fraction) ([place]) ?  Status post biventricular pacemaker - MRI compatible PPM and leads ?  Chronic indwelling Foley catheter ? ?Principal problem ?Right-sided weakness-following a fall at home.  MRI of the brain as well as C-spine without acute findings, but shows his known meningiomas with slightly worsening surrounding edema.  Its not exactly clear as to why his right-sided is weaker after the fall, neurology and neurosurgery consulted, possibly cord compression, brain concussion in the face of the cerebral edema, possible brachial plexus stretch injury.  He has been placed on Decadron, continue.  Worked with therapy today, does not look like he is safe to go home, OT recommending CIR, awaiting PT eval ? ?Active problems ?Meningiomas-as per MRI below.  He  had gamma knife radiation at [facility], he is having follow-up as an outpatient soon ? ?Chronic systolic CHF-currently euvolemic ? ?Status post biventricular pacemaker-no active issues ? ?Personal history of PE-he is no longer on anticoagulation for the past several years ? ?Chronic urinary retention-has a chronic Foley since January 2022 ? ?History of cardiac arrest-2022 ? ?Essential hypertension-soft blood pressures at home, hold hydralazine and ARB, continue metoprolol ? ?Hyperlipidemia-continue statin ? ?Scheduled Meds: ? aspirin EC  81 mg Oral Daily  ? atorvastatin  80 mg Oral Daily  ? Chlorhexidine Gluconate Cloth  6 each Topical Daily  ? dexamethasone  2 mg Oral Q6H  ? isosorbide dinitrate  30 mg Oral BID  ? melatonin  5 mg Oral QHS  ? [START ON 01/24/2022] metoprolol succinate  50 mg Oral Q breakfast  ? ?Continuous Infusions: ?PRN Meds:.iohexol ? ?Diet Orders (From admission, onward)  ? ?  Start     Ordered  ? 01/22/22 1425  Diet Heart Room service appropriate? Yes; Fluid consistency: Thin  Diet effective now       ?Question Answer Comment  ?Room service appropriate? Yes   ?Fluid consistency: Thin   ?  ? 01/22/22 1424  ? ?  ?  ? ?  ? ? ?DVT prophylaxis: Place and maintain sequential compression device Start: 01/23/22 1338 ? ? ?Lab Results  ?Component Value Date  ? PLT 237 01/23/2022  ? ? ?  Code Status: Full Code ? ?Family Communication: family at bedside  ? ?Status is: Inpatient ? ?Remains inpatient appropriate because: CIR ? ? ?  Level of care: Progressive ? ?Consultants:  ?Neurology ?Neurosurgery ? ?Procedures:  ?none ? ?Microbiology  ?none ? ?Antimicrobials: ?none  ? ? ?Objective: ?Vitals:  ? 01/22/22 1829 01/22/22 2200 01/23/22 0749 01/23/22 1348  ?BP: 134/72 (!) 142/69 (!) 144/79 (!) 96/57  ?Pulse: 71 64 65 66  ?Resp: '18 20 20 18  '$ ?Temp:  97.9 ?F (36.6 ?C) 98 ?F (36.7 ?C) 98 ?F (36.7 ?C)  ?TempSrc:  Oral Oral   ?SpO2: 97% 98% 98% 95%  ?Weight:      ?Height:      ? ? ?Intake/Output Summary (Last  24 hours) at 01/23/2022 1427 ?Last data filed at 01/23/2022 1300 ?Gross per 24 hour  ?Intake 340 ml  ?Output 300 ml  ?Net 40 ml  ? ?Wt Readings from Last 3 Encounters:  ?01/22/22 99.8 kg  ?11/03/21 99.8 kg  ?10/01/21 101.2 kg  ? ? ?Examination: ? ?Constitutional: NAD ?Eyes: no scleral icterus ?ENMT: Mucous membranes are moist.  ?Neck: normal, supple ?Respiratory: clear to auscultation bilaterally, no wheezing, no crackles. Normal respiratory effort.  ?Cardiovascular: Regular rate and rhythm, no murmurs / rubs / gallops.  ?Abdomen: non distended, no tenderness. Bowel sounds positive.  ?Musculoskeletal: no clubbing / cyanosis.  ?Skin: no rashes ?Neurologic: Right-sided weakness, ataxia ? ?Data Reviewed: I have independently reviewed following labs and imaging studies  ? ?CBC ?Recent Labs  ?Lab 01/21/22 ?2346 01/21/22 ?2352 01/23/22 ?0136  ?WBC 11.5*  --  12.0*  ?HGB 12.1* 12.2* 10.3*  ?HCT 37.2* 36.0* 30.4*  ?PLT 259  --  237  ?MCV 94.7  --  91.3  ?MCH 30.8  --  30.9  ?MCHC 32.5  --  33.9  ?RDW 13.2  --  13.4  ?LYMPHSABS 2.7  --   --   ?MONOABS 0.9  --   --   ?EOSABS 0.2  --   --   ?BASOSABS 0.1  --   --   ? ? ?Recent Labs  ?Lab 01/21/22 ?2346 01/21/22 ?2352 01/23/22 ?0136  ?NA 136 138 137  ?K 4.3 4.4 4.3  ?CL 109 109 110  ?CO2 19*  --  20*  ?GLUCOSE 128* 124* 123*  ?BUN 31* 32* 36*  ?CREATININE 2.20* 2.40* 2.26*  ?CALCIUM 9.0  --  8.6*  ?AST 28  --   --   ?ALT 22  --   --   ?ALKPHOS 63  --   --   ?BILITOT 0.8  --   --   ?ALBUMIN 4.1  --   --   ?LATICACIDVEN 1.6  --   --   ?INR 1.1  --   --   ? ? ?------------------------------------------------------------------------------------------------------------------ ?No results for input(s): CHOL, HDL, LDLCALC, TRIG, CHOLHDL, LDLDIRECT in the last 72 hours. ? ?Lab Results  ?Component Value Date  ? HGBA1C 7.0 (H) 10/06/2020  ? ?------------------------------------------------------------------------------------------------------------------ ?No results for input(s): TSH,  T4TOTAL, T3FREE, THYROIDAB in the last 72 hours. ? ?Invalid input(s): FREET3 ? ?Cardiac Enzymes ?No results for input(s): CKMB, TROPONINI, MYOGLOBIN in the last 168 hours. ? ?Invalid input(s): CK ?------------------------------------------------------------------------------------------------------------------ ?   ?Component Value Date/Time  ? BNP 1,434.0 (H) 10/08/2020 1456  ? ? ?CBG: ?No results for input(s): GLUCAP in the last 168 hours. ? ?Recent Results (from the past 240 hour(s))  ?Resp Panel by RT-PCR (Flu A&B, Covid) Nasopharyngeal Swab     Status: None  ? Collection Time: 01/22/22  2:21 AM  ? Specimen: Nasopharyngeal Swab; Nasopharyngeal(NP) swabs in vial transport medium  ?Result Value Ref Range Status  ?  SARS Coronavirus 2 by RT PCR NEGATIVE NEGATIVE Final  ?  Comment: (NOTE) ?SARS-CoV-2 target nucleic acids are NOT DETECTED. ? ?The SARS-CoV-2 RNA is generally detectable in upper respiratory ?specimens during the acute phase of infection. The lowest ?concentration of SARS-CoV-2 viral copies this assay can detect is ?138 copies/mL. A negative result does not preclude SARS-Cov-2 ?infection and should not be used as the sole basis for treatment or ?other patient management decisions. A negative result may occur with  ?improper specimen collection/handling, submission of specimen other ?than nasopharyngeal swab, presence of viral mutation(s) within the ?areas targeted by this assay, and inadequate number of viral ?copies(<138 copies/mL). A negative result must be combined with ?clinical observations, patient history, and epidemiological ?information. The expected result is Negative. ? ?Fact Sheet for Patients:  ?EntrepreneurPulse.com.au ? ?Fact Sheet for Healthcare Providers:  ?IncredibleEmployment.be ? ?This test is no t yet approved or cleared by the [place] FDA and  ?has been authorized for detection and/or diagnosis of SARS-CoV-2 by ?FDA under an Emergency Use  Authorization (EUA). This EUA will remain  ?in effect (meaning this test can be used) for the duration of the ?COVID-19 declaration under Section 564(b)(1) of the Act, 21 ?U.S.C.section 360bbb-3(b)(1), unless

## 2022-01-23 NOTE — Evaluation (Signed)
Physical Therapy Evaluation ?Patient Details ?Name: Timothy Moran ?MRN: [id number] ?DOB: [birth year] ?Today's Date: 01/23/2022 ? ?History of Present Illness ? Patient is a 76 yo male presenting to the ED status post fall onto R shoulder R leg and head on 01/21/22. Patient could not move R leg or shoulder post fall. MRI C-spine shows no injury to the cord or spine, no ligamentous injury, MRI of brain shows known meningiomas however is followed at [facility] for [facility]. PMH incudes:  CKD 3, history of cardiac arrest in January 2022 due to STEMI, recent biventricular pacemaker placed February 2023, chronic urinary retention with chronic Foley, history of remote PE no longer anticoagulation ?  ?Clinical Impression ? Pt in bed upon arrival of PT, agreeable to evaluation at this time. Prior to admission the pt was completely independent without need for DME, reports no other falls in last 6 months. The pt was driving and independent with all ADLs and IADLs. The pt now presents with limitations in functional mobility, strength, stability, coordination, proprioception, safety awareness, and endurance due to above dx, and will continue to benefit from skilled PT to address these deficits. The pt was able to stand without DME, but was significantly unstable and needing up to modA to maintain static stance. With BUE supported on RW, the pt was able to manage short bout of ambulation with modA to manage movement of RW and mod-maxA to manage pt stability. He demos significant ataxia and coordination deficits with LE movement for gait and had single LOB needing maxA to recover. Given prior level of independence and good support at home, recommend acute inpatient rehab after d/c to maximize functional recovery.  ?   ? ?Recommendations for follow up therapy are one component of a multi-disciplinary discharge planning process, led by the attending physician.  Recommendations may be updated based on patient status, additional functional  criteria and insurance authorization. ? ?Follow Up Recommendations Acute inpatient rehab (3hours/day) ? ?  ?Assistance Recommended at Discharge Frequent or constant Supervision/Assistance  ?Patient can return home with the following ? Two people to help with walking and/or transfers;Two people to help with bathing/dressing/bathroom;Assistance with cooking/housework;Direct supervision/assist for medications management;Direct supervision/assist for financial management;Assist for transportation;Help with stairs or ramp for entrance ? ?  ?Equipment Recommendations  (defer to post acute)  ?Recommendations for Other Services ? Rehab consult  ?  ?Functional Status Assessment Patient has had a recent decline in their functional status and demonstrates the ability to make significant improvements in function in a reasonable and predictable amount of time.  ? ?  ?Precautions / Restrictions Precautions ?Precautions: Fall ?Precaution Comments: admitted s/p fall + head injury ?Restrictions ?Weight Bearing Restrictions: No  ? ?  ? ?Mobility ? Bed Mobility ?Overal bed mobility: Needs Assistance ?Bed Mobility: Supine to Sit ?  ?  ?Supine to sit: Min assist ?  ?  ?General bed mobility comments: Unable to coordinate RLE without assist, otherwise able to scoot to EOB and control trunk ?  ? ?Transfers ?Overall transfer level: Needs assistance ?Equipment used: Rolling walker (2 wheels), None ?Transfers: Sit to/from Stand ?Sit to Stand: Mod assist ?  ?  ?  ?  ?  ?General transfer comment: Patient standing x1 w/o RW, with significant swaying back and forth and poor body awareness, utilizing RW for 2nd attempt, cues to hand placement needed throughout for motor planning, significant ataxic movement when ambulating ?  ? ?Ambulation/Gait ?Ambulation/Gait assistance: Mod assist, Max assist, +2 physical assistance, +2 safety/equipment ?Gait Distance (Feet): 45  Feet ?Assistive device: Rolling walker (2 wheels) ?Gait Pattern/deviations:  Step-through pattern, Decreased stride length, Knee flexed in stance - left, Knees buckling, Scissoring, Narrow base of support, Staggering right, Ataxic ?Gait velocity: decreased ?Gait velocity interpretation: <1.31 ft/sec, indicative of household ambulator ?  ?General Gait Details: pt with leaning to R and ataxic gait. single LOB to R needing maxA to correct. pt with poor insight to deficits and needing maxA to RW to maintain saf positioning of RW and modA to pt to steady ?  ? ?Balance Overall balance assessment: Needs assistance ?Sitting-balance support: Single extremity supported, Bilateral upper extremity supported, Feet supported ?Sitting balance-Leahy Scale: Poor ?  ?Postural control: Posterior lean ?Standing balance support: Reliant on assistive device for balance, During functional activity, Bilateral upper extremity supported ?Standing balance-Leahy Scale: Poor ?Standing balance comment: Reliant on RW poor body awareness ?  ?  ?  ?  ?  ?  ?  ?  ?  ?  ?  ?   ? ? ? ?Pertinent Vitals/Pain Pain Assessment ?Pain Assessment: Faces ?Faces Pain Scale: Hurts even more ?Pain Location: RUE and RLE with movement ?Pain Descriptors / Indicators: Discomfort, Grimacing, Guarding ?Pain Intervention(s): Limited activity within patient's tolerance, Monitored during session, Repositioned  ? ? ?Home Living Family/patient expects to be discharged to:: Private residence ?Living Arrangements: Spouse/significant other ?Available Help at Discharge: Family;Available 24 hours/day ?Type of Home: House ?Home Access: Stairs to enter ?Entrance Stairs-Rails: Right ?Entrance Stairs-Number of Steps: 7 ?  ?Home Layout: Able to live on main level with bedroom/bathroom ?Home Equipment: Shower seat;Grab bars - tub/shower;Hand held Engineering geologist (2 wheels) ?   ?  ?Prior Function Prior Level of Function : Independent/Modified Independent;Driving ?  ?  ?  ?  ?  ?  ?Mobility Comments: pt reports not exercising as much as he should due  to "laziness" but can mow the lawn with self-propelled mower, reports limited endurance ?ADLs Comments: independent, stands to shower ?  ? ? ?Hand Dominance  ? Dominant Hand: Right ? ?  ?Extremity/Trunk Assessment  ? Upper Extremity Assessment ?Upper Extremity Assessment: Defer to OT evaluation ?  ? ?Lower Extremity Assessment ?Lower Extremity Assessment: RLE deficits/detail ?RLE Deficits / Details: pt with grossly functional strength at knee, hip and ankle. increased time and effort to reach test position. pt scoring 1/5 on proprioception testing at R great toe, reports normal sensation, marked coordination deficits in RLE compared to LLE ?RLE Sensation: decreased proprioception ?RLE Coordination: decreased fine motor;decreased gross motor ?  ? ?Cervical / Trunk Assessment ?Cervical / Trunk Assessment: Normal  ?Communication  ? Communication: No difficulties  ?Cognition Arousal/Alertness: Awake/alert ?Behavior During Therapy: Impulsive ?Overall Cognitive Status: Impaired/Different from baseline ?Area of Impairment: Safety/judgement, Problem solving, Memory ?  ?  ?  ?  ?  ?  ?  ?  ?  ?  ?Memory: Decreased short-term memory ?  ?Safety/Judgement: Decreased awareness of safety, Decreased awareness of deficits ?  ?Problem Solving: Difficulty sequencing, Requires verbal cues ?General Comments: Hides cognitive deficits very well, poor insight into deficits, impulsive ?  ?  ? ?  ?General Comments General comments (skin integrity, edema, etc.): VSS on RA ? ?  ?   ? ?Assessment/Plan  ?  ?PT Assessment Patient needs continued PT services  ?PT Problem List Decreased strength;Decreased activity tolerance;Decreased balance;Decreased range of motion;Decreased mobility;Decreased cognition;Decreased coordination;Decreased knowledge of use of DME;Decreased safety awareness;Impaired sensation ? ?   ?  ?PT Treatment Interventions DME instruction;Gait training;Stair training;Functional mobility training;Therapeutic  activities;Therapeutic exercise;Balance training;Neuromuscular re-education;Cognitive remediation;Patient/family education   ? ?PT Goals (Current goals can be found in the Care Plan section)  ?Acute Rehab PT Goals ?Patient Stat

## 2022-01-23 NOTE — Progress Notes (Signed)
Inpatient Rehab Admissions Coordinator:  ? ?Per therapy recommendations,  patient was screened for CIR candidacy by Clemens Catholic, MS, CCC-SLP. I will request rehab MD review pt. For medical necessity and update chart when complete.  ? ?Clemens Catholic, MS, CCC-SLP ?Rehab Admissions Coordinator  ?[phone] (celll) ?[phone] (office) ? ? ? ?

## 2022-01-23 NOTE — Evaluation (Signed)
Occupational Therapy Evaluation Patient Details Name: Timothy Moran MRN: [id number] DOB: [birth year] Today's Date: 01/23/2022   History of Present Illness Patient is a 76 yo male presenting to the ED status post fall onto R shoulder R leg and head on 01/21/22. Patient could not move R leg or shoulder post fall. MRI C-spine shows no injury to the cord or spine, no ligamentous injury, MRI of brain shows known meningiomas however is followed at [facility] for treatment. PMH incudes:  CKD 3, history of cardiac arrest in January 2022 due to STEMI, recent biventricular pacemaker placed February 2023, chronic urinary retention with chronic Foley, history of remote PE no longer anticoagulation   Clinical Impression   Prior to this admission, patient was fully independent and living with his wife. Patient endorses he still drives. Currently, patient is presenting with RUE and RLE pain, ataxic movement with ambulation and transfers, decreased proprioception, and poor insight into deficits. Patient with numbess and tingling consistent with R median nerve agitation, however difficult to truly pinpoint as patient hit his head and is also endorsing cervical pain (CT and MRI clear).  Patient with decreased coordination and lacking full AROM in shoulder (85 degrees) and decreased end range AROM for elbow flexion. Patient is R handed and this level of deficit is new post fall. OT recommending AIR placement due to previous level of function, and rehabilitation potential. OT will continue to follow acutely.      Recommendations for follow up therapy are one component of a multi-disciplinary discharge planning process, led by the attending physician.  Recommendations may be updated based on patient status, additional functional criteria and insurance authorization.   Follow Up Recommendations  Acute inpatient rehab (3hours/day)    Assistance Recommended at Discharge Frequent or constant Supervision/Assistance  Patient can  return home with the following Two people to help with walking and/or transfers;A lot of help with bathing/dressing/bathroom;Assistance with cooking/housework;Direct supervision/assist for medications management;Direct supervision/assist for financial management;Assist for transportation    Functional Status Assessment  Patient has had a recent decline in their functional status and demonstrates the ability to make significant improvements in function in a reasonable and predictable amount of time.  Equipment Recommendations  Other (comment) (Will continue to assess)    Recommendations for Other Services Rehab consult     Precautions / Restrictions Precautions Precautions: Fall Precaution Comments: admitted s/p fall + head injury Restrictions Weight Bearing Restrictions: No      Mobility Bed Mobility Overal bed mobility: Needs Assistance Bed Mobility: Supine to Sit     Supine to sit: Min assist     General bed mobility comments: Unable to coordinate RLE without assist, otherwise able to scoot to EOB and control trunk    Transfers Overall transfer level: Needs assistance Equipment used: Rolling walker (2 wheels), None Transfers: Sit to/from Stand Sit to Stand: Mod assist           General transfer comment: Patient standing x1 w/o RW, with significant swaying back and forth and poor body awareness, utilizing RW for 2nd attempt, cues to hand placement needed throughout for motor planning, significant ataxic movement when ambulating      Balance Overall balance assessment: Needs assistance Sitting-balance support: Single extremity supported, Bilateral upper extremity supported, Feet supported Sitting balance-Leahy Scale: Poor   Postural control: Posterior lean Standing balance support: Reliant on assistive device for balance, During functional activity, Bilateral upper extremity supported Standing balance-Leahy Scale: Poor Standing balance comment: Reliant on RW poor  body awareness  ADL either performed or assessed with clinical judgement   ADL Overall ADL's : Needs assistance/impaired Eating/Feeding: Set up;Sitting   Grooming: Set up;Sitting   Upper Body Bathing: Minimal assistance;Sitting   Lower Body Bathing: Moderate assistance;Maximal assistance;Sitting/lateral leans;Sit to/from stand   Upper Body Dressing : Minimal assistance;Sitting   Lower Body Dressing: Moderate assistance;Maximal assistance;Sitting/lateral leans;Sit to/from stand   Toilet Transfer: Moderate assistance;Maximal assistance;+2 for safety/equipment;+2 for physical assistance;Ambulation;Rolling walker (2 wheels) Toilet Transfer Details (indicate cue type and reason): Significant ataxia with movement Toileting- Clothing Manipulation and Hygiene: Minimal assistance;Sitting/lateral lean Toileting - Clothing Manipulation Details (indicate cue type and reason): foley     Functional mobility during ADLs: Moderate assistance;Maximal assistance;+2 for physical assistance;+2 for safety/equipment;Cueing for sequencing;Cueing for safety;Rolling walker (2 wheels) General ADL Comments: Patient presenting with RUE deficits, ataxia with movement, poor proprioception and decreased insight into deficits.     Vision Baseline Vision/History: 1 Wears glasses Ability to See in Adequate Light: 0 Adequate Patient Visual Report: No change from baseline Additional Comments: No nystagmus noted when sitting or standing, no dizziness with movement     Perception     Praxis      Pertinent Vitals/Pain Pain Assessment Pain Assessment: Faces Faces Pain Scale: Hurts even more Pain Location: RUE and RLE with movement Pain Descriptors / Indicators: Discomfort, Grimacing, Guarding Pain Intervention(s): Limited activity within patient's tolerance, Monitored during session, Repositioned     Hand Dominance Right   Extremity/Trunk Assessment Upper Extremity  Assessment Upper Extremity Assessment: RUE deficits/detail RUE Deficits / Details: Patient fell on R elbow, full ROM at wrist with effort (min difficulty with wrist elevation 4/5 strength, 4/5 wrist flexion) end range limited with elbow flexion, 4/5 strength flexion and extension at elbow, unable to engage in shoulder flexion past 85 degrees, cannot hold shoulder against gravity for longer than 2 seconds, able to complete shoulder elevation 3-/5 strength, can complete shoulder abducation with elbow flexed at 90 to full range but significant pain noted, full palpation completed of scapula and clavicle with no abnormalities noted. Decreased coordination noted with all movements and patient is R hand dominant RUE: Shoulder pain with ROM;Shoulder pain at rest RUE Sensation: decreased light touch;decreased proprioception RUE Coordination: decreased fine motor;decreased gross motor   Lower Extremity Assessment Lower Extremity Assessment: Defer to PT evaluation   Cervical / Trunk Assessment Cervical / Trunk Assessment: Normal   Communication Communication Communication: No difficulties   Cognition Arousal/Alertness: Awake/alert Behavior During Therapy: Impulsive Overall Cognitive Status: Impaired/Different from baseline Area of Impairment: Safety/judgement, Problem solving, Memory                     Memory: Decreased short-term memory   Safety/Judgement: Decreased awareness of safety, Decreased awareness of deficits   Problem Solving: Difficulty sequencing, Requires verbal cues General Comments: Hides cognitive deficits very well, poor insight into deficits, impulsive     General Comments  VSS on RA    Exercises     Shoulder Instructions      Home Living Family/patient expects to be discharged to:: Private residence Living Arrangements: Spouse/significant other Available Help at Discharge: Family;Available 24 hours/day Type of Home: House Home Access: Stairs to  enter [employer] of Steps: 7 Entrance Stairs-Rails: Right Home Layout: Able to live on main level with bedroom/bathroom     Bathroom Shower/Tub: Producer, television/film/video: Handicapped height     Home Equipment: Shower seat;Grab bars - tub/shower;Hand held Programmer, systems (2 wheels)  Prior Functioning/Environment Prior Level of Function : Independent/Modified Independent;Driving             Mobility Comments: pt reports not exercising as much as he should due to "laziness" but can mow the lawn with self-propelled mower, reports limited endurance ADLs Comments: independent, stands to shower        OT Problem List: Decreased strength;Decreased range of motion;Decreased activity tolerance;Impaired balance (sitting and/or standing);Decreased coordination;Decreased cognition;Decreased safety awareness;Decreased knowledge of use of DME or AE;Decreased knowledge of precautions;Impaired sensation;Impaired UE functional use;Pain      OT Treatment/Interventions: Self-care/ADL training;Therapeutic exercise;Neuromuscular education;Energy conservation;DME and/or AE instruction;Manual therapy;Therapeutic activities;Cognitive remediation/compensation;Patient/family education;Balance training    OT Goals(Current goals can be found in the care plan section) Acute Rehab OT Goals Patient Stated Goal: to return to independence OT Goal Formulation: With patient/family Time For Goal Achievement: 02/06/22 Potential to Achieve Goals: Good  OT Frequency: Min 3X/week    Co-evaluation PT/OT/SLP Co-Evaluation/Treatment: Yes Reason for Co-Treatment: Complexity of the patient's impairments (multi-system involvement);Necessary to address cognition/behavior during functional activity;For patient/therapist safety;To address functional/ADL transfers   OT goals addressed during session: ADL's and self-care;Proper use of Adaptive equipment and DME;Strengthening/ROM       AM-PAC OT "6 Clicks" Daily Activity     Outcome Measure Help from another person eating meals?: A Little Help from another person taking care of personal grooming?: A Little Help from another person toileting, which includes using toliet, bedpan, or urinal?: A Lot Help from another person bathing (including washing, rinsing, drying)?: A Lot Help from another person to put on and taking off regular upper body clothing?: A Little Help from another person to put on and taking off regular lower body clothing?: A Lot 6 Click Score: 15   End of Session Equipment Utilized During Treatment: Gait belt;Rolling walker (2 wheels) Nurse Communication: Mobility status  Activity Tolerance: Patient tolerated treatment well Patient left: in chair;with call bell/phone within reach;with family/visitor present  OT Visit Diagnosis: Unsteadiness on feet (R26.81);Other abnormalities of gait and mobility (R26.89);Muscle weakness (generalized) (M62.81);Pain;Ataxia, unspecified (R27.0) Pain - Right/Left: Right Pain - part of body: Shoulder;Arm;Hip                Time: 3086-5784 OT Time Calculation (min): 47 min Charges:  OT General Charges $OT Visit: 1 Visit OT Evaluation $OT Eval Moderate Complexity: 1 Mod  Pollyann Glen E. Lutie Pickler, OTR/L Acute Rehabilitation Services [phone]   Cherlyn Cushing 01/23/2022, 11:05 AM

## 2022-01-23 NOTE — Progress Notes (Addendum)
Neurology Progress Note ? ? ?S:// ?C-collar removed.  ?Patient feels strength RUE improving somewhat but that he "does not have full control over it" .  ?Still not back to baseline in terms of ambulation, acute rehab recommended by PT/OT  ? ?O:// ?Current vital signs: ?BP (!) 96/57 (BP Location: Right Arm)   Pulse 66   Temp 98 ?F (36.7 ?C)   Resp 18   Ht '6\' 1"'$  (1.854 m)   Wt 99.8 kg   SpO2 95%   BMI 29.03 kg/m?  ?Vital signs in last 24 hours: ?Temp:  [97.9 ?F (36.6 ?C)-98 ?F (36.7 ?C)] 98 ?F (36.7 ?C) (05/14 1348) ?Pulse Rate:  [56-75] 66 (05/14 1348) ?Resp:  [14-20] 18 (05/14 1348) ?BP: (96-144)/(57-84) 96/57 (05/14 1348) ?SpO2:  [93 %-98 %] 95 % (05/14 1348) ?GENERAL: Awake, alert in NAD ?HEENT: c-collar in place ?LUNGS - Clear to auscultation bilaterally with no wheezes ?CV - S1S2 RRR, no m/r/g, equal pulses bilaterally. ?ABDOMEN - Soft, nontender, nondistended with normoactive BS ?NEURO:  ?Mental Status: AA&Ox3  ?Language: speech is clear.  Naming, repetition, fluency, and comprehension intact. ?Cranial Nerves: PERRL . EOMI, visual fields full, no facial asymmetry, facial sensation intact, hearing intact, tongue/uvula/soft palate midline, normal sternocleidomastoid and trapezius muscle strength. No evidence of tongue atrophy or fibrillations ?Motor: RUE seems nearly 5/5 but subtle weakness on elbow flexion 4+/5. RLE also drifts in comparison to left LE which does not drift vertically, ? R deltoid 4+/5 weakness as well but may be limited by pain LUE is also full strength with no drift. ?Tone: is normal and bulk is normal ?Sensation- C5, C8-T1 impaired pin prick and temperature sensation RUE but otherwise in tact. Vibratory sensation in tact.   ?Coordination: grossly ataxic in RUE ?Gait- deferred ? ?Medications ? ?Current Facility-Administered Medications:  ?  aspirin EC tablet 81 mg, 81 mg, Oral, Daily, Gherghe, Costin M, MD ?  atorvastatin (LIPITOR) tablet 80 mg, 80 mg, Oral, Daily, Gherghe, Vella Redhead,  MD ?  Chlorhexidine Gluconate Cloth 2 % PADS 6 each, 6 each, Topical, Daily, Caren Griffins, MD, 6 each at 01/23/22 1001 ?  dexamethasone (DECADRON) tablet 2 mg, 2 mg, Oral, Q6H, Gherghe, Costin M, MD, 2 mg at 01/23/22 1410 ?  iohexol (OMNIPAQUE) 350 MG/ML injection 100 mL, 100 mL, Intravenous, Once PRN, Donnetta Simpers, MD ?  isosorbide dinitrate (ISORDIL) tablet 30 mg, 30 mg, Oral, BID, Gherghe, Costin M, MD ?  melatonin tablet 5 mg, 5 mg, Oral, QHS, Gherghe, Vella Redhead, MD ?  Derrill Memo ON 01/24/2022] metoprolol succinate (TOPROL-XL) 24 hr tablet 50 mg, 50 mg, Oral, Q breakfast, Gherghe, Vella Redhead, MD ?Labs ?CBC ?   ?Component Value Date/Time  ? WBC 12.0 (H) 01/23/2022 0136  ? RBC 3.33 (L) 01/23/2022 0136  ? HGB 10.3 (L) 01/23/2022 0136  ? HGB 11.9 (L) 10/11/2021 3785  ? HCT 30.4 (L) 01/23/2022 0136  ? HCT 36.2 (L) 10/11/2021 8850  ? PLT 237 01/23/2022 0136  ? PLT 288 10/11/2021 0838  ? MCV 91.3 01/23/2022 0136  ? MCV 93 10/11/2021 0838  ? MCH 30.9 01/23/2022 0136  ? MCHC 33.9 01/23/2022 0136  ? RDW 13.4 01/23/2022 0136  ? RDW 13.7 10/11/2021 0838  ? LYMPHSABS 2.7 01/21/2022 2346  ? MONOABS 0.9 01/21/2022 2346  ? EOSABS 0.2 01/21/2022 2346  ? BASOSABS 0.1 01/21/2022 2346  ? ? ?CMP  ?   ?Component Value Date/Time  ? NA 137 01/23/2022 0136  ? NA 143 10/11/2021 0838  ?  K 4.3 01/23/2022 0136  ? CL 110 01/23/2022 0136  ? CO2 20 (L) 01/23/2022 0136  ? GLUCOSE 123 (H) 01/23/2022 0136  ? BUN 36 (H) 01/23/2022 0136  ? BUN 25 10/11/2021 0838  ? CREATININE 2.26 (H) 01/23/2022 0136  ? CALCIUM 8.6 (L) 01/23/2022 0136  ? PROT 7.3 01/21/2022 2346  ? PROT 7.5 06/19/2020 1016  ? ALBUMIN 4.1 01/21/2022 2346  ? ALBUMIN 4.7 06/19/2020 1016  ? AST 28 01/21/2022 2346  ? ALT 22 01/21/2022 2346  ? ALKPHOS 63 01/21/2022 2346  ? BILITOT 0.8 01/21/2022 2346  ? BILITOT 0.4 06/19/2020 1016  ? GFRNONAA 29 (L) 01/23/2022 0136  ? GFRAA 57 (L) 06/19/2020 1016  ? ?Imaging ?I have reviewed images in epic and the results pertinent to this  consultation are: ?CTH and CT C-spine with no acute findings ? ?MRI brain:  ?1. No acute infarct. ?2. Similar size of extra-axial masses along the falx, left frontal ?convexity, and in the atrium of the left lateral ventricle compared ?to the 2021 MRI presumably reflecting meningiomas. New moderate ?edema in the left frontoparietal white matter. No midline shift. ?3. Mild chronic small vessel ischemic disease. ? ?Cervical spine MRI: ?1. No acute bony findings and normal appearance of the cervical ?spinal cord. ?2. Multilevel disc disease and facet disease with multilevel disc ?protrusions, osteophytic ridging and uncinate spurring. No ?significant spinal stenosis. ?3. Moderate right and mild left foraminal stenosis at C4-5. ? ?Assessment:  ?76/M with left parafalcine meningioma with vasogenic edema, h/o cardiac arrest due to STEMI s/p BiV pacemaker placement, chronic indwelling foley, h/o PE not on AC, presented after a fall after missing a 18 inch drop on sidewalk noted to be weak in RUE and RLE for which neurology is consulted. CT head and neck with no acute findings. ?Etiology of weakness likely multifactorial: Most significantly from new area of edema in left frontoparietal white matter on MRI as well as with now resolving peripheral tension neuropraxia/nerve impingement from direct elbow strike on right, and moderate right foraminal narrowing at C4-5. Brachial plexus injury less likely but cannot be entirely ruled out.  ? ?Recommendations: ?-continue steroids under care of neurosurgery  ?-neurology will sign off but remains available for questions  ?-neurology follow up long term should weakness not improve with course of steroids/acute rehabilitation ?Discussed with Dr .Quinn Axe.  ? ?Lennie Hummer, PA-C ?Neurology ? ?

## 2022-01-24 ENCOUNTER — Encounter: Payer: Medicare Other | Admitting: Cardiology

## 2022-01-24 DIAGNOSIS — N1832 Chronic kidney disease, stage 3b: Secondary | ICD-10-CM | POA: Diagnosis not present

## 2022-01-24 DIAGNOSIS — I5022 Chronic systolic (congestive) heart failure: Secondary | ICD-10-CM | POA: Diagnosis not present

## 2022-01-24 DIAGNOSIS — R531 Weakness: Secondary | ICD-10-CM | POA: Diagnosis not present

## 2022-01-24 DIAGNOSIS — Z978 Presence of other specified devices: Secondary | ICD-10-CM | POA: Diagnosis not present

## 2022-01-24 NOTE — Progress Notes (Signed)
OT Note ? ?Pt seen today for OT. Recommend ARI. Pt in agreement. Full note to follow. ? ?[facility], OT/L  ? ?Acute OT Clinical Specialist ?Acute Rehabilitation Services ?Pager [phone] ?Office [phone]  ?

## 2022-01-24 NOTE — Progress Notes (Signed)
Subjective: ?The patient is alert and pleasant.  He tells me he was doing well until he took a fall on Friday night at a friend's house.  After that he had acute weakness in his right upper extremity.  This has largely resolved but he feels more or less a neuropraxia of his right upper extremity.  He tells me he has had gamma knife radiation to his brain lesion and has follow-up at [facility] next week for this.  He complains of some low back pain and bilateral hip discomfort. ? ?Objective: ?Vital signs in last 24 hours: ?Temp:  [97.7 ?F (36.5 ?C)-98.2 ?F (36.8 ?C)] 97.7 ?F (36.5 ?C) (05/15 0529) ?Pulse Rate:  [56-66] 56 (05/15 0529) ?Resp:  [18-21] 18 (05/15 0529) ?BP: (96-125)/(57-73) 114/70 (05/15 0529) ?SpO2:  [93 %-95 %] 93 % (05/15 0529) ?Estimated body mass index is 29.03 kg/m? as calculated from the following: ?  Height as of this encounter: '6\' 1"'$  (1.854 m). ?  Weight as of this encounter: 99.8 kg. ? ? ?Intake/Output from previous day: ?05/14 0701 - 05/15 0700 ?In: 50 [P.O.:580] ?Out: 1025 [Urine:1025] ?Intake/Output this shift: ?No intake/output data recorded. ? ?Physical exam the patient is alert and oriented.  His strength is fairly normal bilaterally.  If he has weakness in his right upper extremity is very mild.  He has more of a apraxia of his right upper extremity. ? ?I reviewed the patient's brain MRI.  His left deep parafalcine lesion measures approximately 3 x 2 cm.  There is some surrounding edema.  His cervical MRI demonstrates mild bulging without significant neural compression. ? ?Lab Results: ?Recent Labs  ?  01/21/22 ?2346 01/21/22 ?2352 01/23/22 ?0136  ?WBC 11.5*  --  12.0*  ?HGB 12.1* 12.2* 10.3*  ?HCT 37.2* 36.0* 30.4*  ?PLT 259  --  237  ? ?BMET ?Recent Labs  ?  01/21/22 ?2346 01/21/22 ?2352 01/23/22 ?0136  ?NA 136 138 137  ?K 4.3 4.4 4.3  ?CL 109 109 110  ?CO2 19*  --  20*  ?GLUCOSE 128* 124* 123*  ?BUN 31* 32* 36*  ?CREATININE 2.20* 2.40* 2.26*  ?CALCIUM 9.0  --  8.6*   ? ? ?Studies/Results: ?MR BRAIN WO CONTRAST ? ?Result Date: 01/22/2022 ?CLINICAL DATA:  Neuro deficit, acute, stroke suspected. Recent fall. Weakness and numbness in the right upper and lower extremities. EXAM: MRI HEAD WITHOUT CONTRAST TECHNIQUE: Multiplanar, multiecho pulse sequences of the brain and surrounding structures were obtained without intravenous contrast. COMPARISON:  Head CT 01/21/2022 and MRI 07/26/2020 FINDINGS: Brain: There is no evidence of an acute infarct, intracranial hemorrhage, midline shift, or extra-axial fluid collection. There is mild cerebral atrophy. Small T2 hyperintensities in the cerebral white matter bilaterally are similar to the prior MRI and are nonspecific but compatible with mild chronic small vessel ischemic disease. A heavily calcified 3.0 x 2.0 cm left parafalcine extra-axial mass is similar in size to the prior MRI with unchanged local mass effect on the corpus callosum and body of the left lateral ventricle. There is moderate edema in the adjacent left frontoparietal white matter which is new from the prior MRI. A partially calcified 3.9 x 1.2 cm extra-axial mass anteriorly over the left frontal convexity is also similar in size to the prior MRI, and there is no associated brain edema. A 1 cm calcified mass associated with the choroid plexus in the atrium of the left lateral ventricle is similar in size to the prior MRI and is again noted to abut the  ependymal surface of the ventricle with minimally increased T2 hyperintensity in the adjacent white matter. Vascular: Major intracranial vascular flow voids are preserved. Skull and upper cervical spine: Unremarkable bone marrow signal. Sinuses/Orbits: Unremarkable orbits. Paranasal sinuses and mastoid air cells are clear. Other: None. IMPRESSION: 1. No acute infarct. 2. Similar size of extra-axial masses along the falx, left frontal convexity, and in the atrium of the left lateral ventricle compared to the 2021 MRI presumably  reflecting meningiomas. New moderate edema in the left frontoparietal white matter. No midline shift. 3. Mild chronic small vessel ischemic disease. Electronically Signed   By: Logan Bores M.D.   On: 01/22/2022 14:12  ? ?MR CERVICAL SPINE WO CONTRAST ? ?Result Date: 01/22/2022 ?CLINICAL DATA:  [place].  Neck pain. EXAM: MRI CERVICAL SPINE WITHOUT CONTRAST TECHNIQUE: Multiplanar, multisequence MR imaging of the cervical spine was performed. No intravenous contrast was administered. COMPARISON:  Cervical spine CT scan, yesterday. FINDINGS: Alignment: Normal Vertebrae: No acute cervical spine fracture is identified. No bone lesions. Cord: Normal cord signal intensity.  No cord lesions or syrinx. Posterior Fossa, vertebral arteries, paraspinal tissues: No significant findings. Disc levels: C2-3: Shallow central disc protrusion with mild impression on the ventral thecal sac centrally. Associated mild narrowing of the ventral CSF space. No foraminal stenosis. C3-4: Broad-based bulging annulus, shallow central disc protrusion, osteophytic ridging and uncinate spurring. There is flattening of the ventral thecal sac and narrowing the ventral CSF space. No foraminal stenosis. C4-5: Bulging annulus and osteophytic ridging with asymmetric flattening of the thecal sac on the right side. There is also uncinate spurring and facet disease contributing to moderate right and mild left foraminal stenosis. C5-6: Bulging annulus, osteophytic ridging and uncinate spurring. No significant spinal or foraminal stenosis. C6-7: Annular bulge and osteophytic ridging but no significant spinal or foraminal stenosis. C7-T1: No significant findings. T1-2: Shallow central disc protrusion without significant neural compression. IMPRESSION: 1. No acute bony findings and normal appearance of the cervical spinal cord. 2. Multilevel disc disease and facet disease with multilevel disc protrusions, osteophytic ridging and uncinate spurring. No significant  spinal stenosis. 3. Moderate right and mild left foraminal stenosis at C4-5. Electronically Signed   By: Marijo Sanes M.D.   On: 01/22/2022 13:43   ? ?Assessment/Plan: ?Right upper extremity weakness/apraxia: I would have with this was coming from his brain edema/left meningioma but he says this happened acutely after his fall.  Nonetheless, he is improving.  He is going to follow-up with his patient is at [facility] regarding his brain tumor and status post stereotactic radiosurgery.  I will sign off.  Please call if I can help. ? LOS: 2 days  ? ? ? ?Ophelia Charter ?01/24/2022, 8:04 AM ? ? ? ? ?Patient ID: Timothy Moran, male   DOB: Oct 20, 1945, 76 y.o.   MRN: [id number] ? ?

## 2022-01-24 NOTE — Progress Notes (Signed)
Inpatient Rehab Admissions Coordinator:  ? ?Met with patient and his spouse at the bedside for CIR assessment.  Explained CIR goals/expectations, 3 hrs/day of therapy, planned d/c home with supervision to modified independent level goals.  Average length of stay 2 weeks, dependent upon progress.  Pt reports he is not currently undergoing treatment for meningiomas and has a follow up appt at [facility] in June.  Both pt and spouse confirm interest in CIR. I will follow for potential admit in the next few days pending bed availability.  ? ?Shann Medal, PT, DPT ?Admissions Coordinator ?[phone] ?01/24/22  ?4:45 PM ? ?

## 2022-01-24 NOTE — Progress Notes (Signed)
?PROGRESS NOTE ? ?Timothy Moran TLX:[id number] DOB: Jan 16, 1946 DOA: 01/21/2022 ?PCP: Cari Caraway, MD ? ? LOS: 2 days  ? ?Brief Narrative / Interim history: ?76 year old male with a history of CKD stage III, history of cardiac arrest back in January 2022 secondary to STEMI, status post recent BiV pacer placement, urinary tension with chronic indwelling Foley, history of PE no longer on anticoagulation, history of multiple meningiomas treated at [facility] who presents to the ER as a trauma. He was visiting a friend, and on his way out stepped out of the walkway and fell about 18 inches on concrete and hit his shoulder and head against the house.  Following that he felt weak on the right side.  Neurology and neurosurgery consulted.  ? ?Subjective / 24h Interval events: ?His weakness is about the same.  No chest pain, no shortness of breath. ? ?Assesement and Plan: ?Principal Problem: ?  Fall at home ?Active Problems: ?  Meningioma ([place]) ?  Personal history of pulmonary embolism - no longer on anticoagulation due to pt refusal to take it anymore. ?  Chronic kidney disease, stage III (moderate) (HCC) ?  Urinary retention ?  Chronic HFrEF (heart failure with reduced ejection fraction) ([place]) ?  Status post biventricular pacemaker - MRI compatible PPM and leads ?  Chronic indwelling Foley catheter ?  Right sided weakness ? ?Principal problem ?Right-sided weakness-following a fall at home.  MRI of the brain as well as C-spine without acute findings, but shows his known meningiomas with slightly worsening surrounding edema.  Its not exactly clear as to why his right-sided is weaker after the fall, neurology and neurosurgery consulted, possibly cord compression, brain concussion in the face of the cerebral edema, possible brachial plexus stretch injury.  He has been placed on Decadron, continue.  Still has right-sided weakness.  Remains to CIR potential candidate, CIR consulted ? ?Active problems ?Meningiomas-as  per MRI below.  He had gamma knife radiation at [facility], he is having follow-up as an outpatient soon ? ?Chronic systolic CHF-currently euvolemic ? ?Status post biventricular pacemaker-no active issues ? ?Personal history of PE-he is no longer on anticoagulation for the past several years ? ?Chronic urinary retention-has a chronic Foley since January 2022 ? ?History of cardiac arrest-2022 ? ?Essential hypertension-soft blood pressures at home, hold hydralazine and ARB, continue metoprolol ? ?Hyperlipidemia-continue statin ? ?Scheduled Meds: ? aspirin EC  81 mg Oral Daily  ? atorvastatin  80 mg Oral Daily  ? Chlorhexidine Gluconate Cloth  6 each Topical Daily  ? dexamethasone  2 mg Oral Q6H  ? isosorbide dinitrate  30 mg Oral BID  ? melatonin  5 mg Oral QHS  ? metoprolol succinate  50 mg Oral Q breakfast  ? senna-docusate  1 tablet Oral QHS  ? ?Continuous Infusions: ?PRN Meds:.acetaminophen, HYDROmorphone (DILAUDID) injection, iohexol, melatonin, oxyCODONE, polyethylene glycol, prochlorperazine ? ?Diet Orders (From admission, onward)  ? ?  Start     Ordered  ? 01/22/22 1425  Diet Heart Room service appropriate? Yes; Fluid consistency: Thin  Diet effective now       ?Question Answer Comment  ?Room service appropriate? Yes   ?Fluid consistency: Thin   ?  ? 01/22/22 1424  ? ?  ?  ? ?  ? ? ?DVT prophylaxis: Place and maintain sequential compression device Start: 01/23/22 1338 ? ? ?Lab Results  ?Component Value Date  ? PLT 237 01/23/2022  ? ? ?  Code Status: Full Code ? ?Family Communication: family  at bedside  ? ?Status is: Inpatient ? ?Remains inpatient appropriate because: CIR ? ? ?Level of care: Progressive ? ?Consultants:  ?Neurology ?Neurosurgery ? ?Procedures:  ?none ? ?Microbiology  ?none ? ?Antimicrobials: ?none  ? ? ?Objective: ?Vitals:  ? 01/23/22 2227 01/24/22 0529 01/24/22 0740 01/24/22 1147  ?BP: 125/73 114/70 127/81 (!) 98/58  ?Pulse: 63 (!) 56 (!) 57 (!) 54  ?Resp: (!) '21 18 17 18  '$ ?Temp: 98.2  ?F (36.8 ?C) 97.7 ?F (36.5 ?C) 98.1 ?F (36.7 ?C) 97.6 ?F (36.4 ?C)  ?TempSrc: Oral Oral Oral Oral  ?SpO2: 94% 93% 95% 97%  ?Weight:      ?Height:      ? ? ?Intake/Output Summary (Last 24 hours) at 01/24/2022 1436 ?Last data filed at 01/24/2022 0530 ?Gross per 24 hour  ?Intake 240 ml  ?Output 725 ml  ?Net -485 ml  ? ? ?Wt Readings from Last 3 Encounters:  ?01/22/22 99.8 kg  ?11/03/21 99.8 kg  ?10/01/21 101.2 kg  ? ? ?Examination: ? ?Constitutional: NAD ?Eyes: lids and conjunctivae normal, no scleral icterus ?ENMT: mmm ?Neck: normal, supple ?Respiratory: clear to auscultation bilaterally, no wheezing, no crackles. Normal respiratory effort.  ?Cardiovascular: Regular rate and rhythm, no murmurs / rubs / gallops. No LE edema. ?Abdomen: soft, no distention, no tenderness. Bowel sounds positive.  ?Skin: no rashes ?Neurologic: Right-sided weakness, dysmetria, no new focal deficits ? ?Data Reviewed: I have independently reviewed following labs and imaging studies  ? ?CBC ?Recent Labs  ?Lab 01/21/22 ?2346 01/21/22 ?2352 01/23/22 ?0136  ?WBC 11.5*  --  12.0*  ?HGB 12.1* 12.2* 10.3*  ?HCT 37.2* 36.0* 30.4*  ?PLT 259  --  237  ?MCV 94.7  --  91.3  ?MCH 30.8  --  30.9  ?MCHC 32.5  --  33.9  ?RDW 13.2  --  13.4  ?LYMPHSABS 2.7  --   --   ?MONOABS 0.9  --   --   ?EOSABS 0.2  --   --   ?BASOSABS 0.1  --   --   ? ? ? ?Recent Labs  ?Lab 01/21/22 ?2346 01/21/22 ?2352 01/23/22 ?0136  ?NA 136 138 137  ?K 4.3 4.4 4.3  ?CL 109 109 110  ?CO2 19*  --  20*  ?GLUCOSE 128* 124* 123*  ?BUN 31* 32* 36*  ?CREATININE 2.20* 2.40* 2.26*  ?CALCIUM 9.0  --  8.6*  ?AST 28  --   --   ?ALT 22  --   --   ?ALKPHOS 63  --   --   ?BILITOT 0.8  --   --   ?ALBUMIN 4.1  --   --   ?LATICACIDVEN 1.6  --   --   ?INR 1.1  --   --   ? ? ? ?------------------------------------------------------------------------------------------------------------------ ?No results for input(s): CHOL, HDL, LDLCALC, TRIG, CHOLHDL, LDLDIRECT in the last 72 hours. ? ?Lab Results   ?Component Value Date  ? HGBA1C 7.0 (H) 10/06/2020  ? ?------------------------------------------------------------------------------------------------------------------ ?No results for input(s): TSH, T4TOTAL, T3FREE, THYROIDAB in the last 72 hours. ? ?Invalid input(s): FREET3 ? ?Cardiac Enzymes ?No results for input(s): CKMB, TROPONINI, MYOGLOBIN in the last 168 hours. ? ?Invalid input(s): CK ?------------------------------------------------------------------------------------------------------------------ ?   ?Component Value Date/Time  ? BNP 1,434.0 (H) 10/08/2020 1456  ? ? ?CBG: ?No results for input(s): GLUCAP in the last 168 hours. ? ?Recent Results (from the past 240 hour(s))  ?Resp Panel by RT-PCR (Flu A&B, Covid) Nasopharyngeal Swab     Status: None  ? Collection  Time: 01/22/22  2:21 AM  ? Specimen: Nasopharyngeal Swab; Nasopharyngeal(NP) swabs in vial transport medium  ?Result Value Ref Range Status  ? SARS Coronavirus 2 by RT PCR NEGATIVE NEGATIVE Final  ?  Comment: (NOTE) ?SARS-CoV-2 target nucleic acids are NOT DETECTED. ? ?The SARS-CoV-2 RNA is generally detectable in upper respiratory ?specimens during the acute phase of infection. The lowest ?concentration of SARS-CoV-2 viral copies this assay can detect is ?138 copies/mL. A negative result does not preclude SARS-Cov-2 ?infection and should not be used as the sole basis for treatment or ?other patient management decisions. A negative result may occur with  ?improper specimen collection/handling, submission of specimen other ?than nasopharyngeal swab, presence of viral mutation(s) within the ?areas targeted by this assay, and inadequate number of viral ?copies(<138 copies/mL). A negative result must be combined with ?clinical observations, patient history, and epidemiological ?information. The expected result is Negative. ? ?Fact Sheet for Patients:  ?EntrepreneurPulse.com.au ? ?Fact Sheet for Healthcare Providers:   ?IncredibleEmployment.be ? ?This test is no t yet approved or cleared by the [place] FDA and  ?has been authorized for detection and/or diagnosis of SARS-CoV-2 by ?FDA under an Emergency Use Authorizat

## 2022-01-24 NOTE — Progress Notes (Signed)
Physical Therapy Treatment ?Patient Details ?Name: Timothy Moran ?MRN: [id number] ?DOB: 1946/01/01 ?Today's Date: 01/24/2022 ? ? ?History of Present Illness Patient is a 76 yo male presenting to the ED status post fall onto R shoulder R leg and head on 01/21/22. Patient could not move R leg or shoulder post fall. MRI C-spine shows no injury to the cord or spine, no ligamentous injury,  PMH incudes: left parafalcine brain meningioma with vasogenic edema (being followed at [facility]), CKD 3, history of cardiac arrest in January 2022 due to STEMI, recent biventricular pacemaker placed February 2023, chronic urinary retention with chronic Foley, history of remote PE no longer anticoagulation ? ?  ?PT Comments  ? ? Pt making steady progress towards his physical therapy goals; he is very motivated to participate. Session with focus on coordination tasks, transfer and gait training. Pt ambulating 70 ft x 2 with a walker at moderate assist level (+2 safety). Demonstrates ataxic gait pattern, decreased R sided coordination, decreased R sided proprioception, and poor standing balance. Suspect excellent progress given PLOF, motivation, and family support. Would benefit from AIR to address deficits and maximize functional mobility.  ?   ?Recommendations for follow up therapy are one component of a multi-disciplinary discharge planning process, led by the attending physician.  Recommendations may be updated based on patient status, additional functional criteria and insurance authorization. ? ?Follow Up Recommendations ? Acute inpatient rehab (3hours/day) ?  ?  ?Assistance Recommended at Discharge Frequent or constant Supervision/Assistance  ?Patient can return home with the following Two people to help with walking and/or transfers;Two people to help with bathing/dressing/bathroom;Assistance with cooking/housework;Direct supervision/assist for medications management;Direct supervision/assist for financial  management;Assist for transportation;Help with stairs or ramp for entrance ?  ?Equipment Recommendations ? None recommended by PT  ?  ?Recommendations for Other Services   ? ? ?  ?Precautions / Restrictions Precautions ?Precautions: Fall ?Restrictions ?Weight Bearing Restrictions: No  ?  ? ?Mobility ? Bed Mobility ?Overal bed mobility: Needs Assistance ?Bed Mobility: Supine to Sit ?  ?  ?Supine to sit: Supervision ?  ?  ?General bed mobility comments: Increased time/effort, no physical assist to exit towards left side of bed ?  ? ?Transfers ?Overall transfer level: Needs assistance ?Equipment used: Rolling walker (2 wheels) ?Transfers: Sit to/from Stand ?Sit to Stand: Min assist ?  ?  ?  ?  ?  ?General transfer comment: MinA to rise from edge of bed and initially steady ?  ? ?Ambulation/Gait ?Ambulation/Gait assistance: Mod assist, +2 safety/equipment ?Gait Distance (Feet): 70 Feet (70 ft x 2 with standing rest break) ?Assistive device: Rolling walker (2 wheels) ?Gait Pattern/deviations: Step-through pattern, Decreased stride length, Knee flexed in stance - left, Knees buckling, Scissoring, Narrow base of support, Staggering right, Ataxic ?Gait velocity: decreased ?Gait velocity interpretation: <1.8 ft/sec, indicate of risk for recurrent falls ?  ?General Gait Details: Cues for RLE coordination, smaller steps for increased control, upright posture, and stopping to reset with overt LOB. Pt requiring modA for static balance with increased sway and intermittent right lateral lean and knee buckle. Ataxic gait wtih overall decreased gross motor coordination of RLE ? ? ?Stairs ?  ?  ?  ?  ?  ? ? ?Wheelchair Mobility ?  ? ?Modified Rankin (Stroke Patients Only) ?  ? ? ?  ?Balance Overall balance assessment: Needs assistance ?Sitting-balance support: Feet supported ?Sitting balance-Leahy Scale: Fair ?  ?  ?Standing balance support: Bilateral upper extremity supported ?Standing balance-Leahy Scale: Poor ?Standing balance  comment: reliant on RW ?  ?  ?  ?  ?  ?  ?  ?  ?  ?  ?  ?  ? ?  ?Cognition Arousal/Alertness: Awake/alert ?Behavior During Therapy: [facility] for tasks assessed/performed ?Overall Cognitive Status: Impaired/Different from baseline ?Area of Impairment: Safety/judgement, Problem solving, Memory ?  ?  ?  ?  ?  ?  ?  ?  ?  ?  ?Memory: Decreased short-term memory ?  ?Safety/Judgement: Decreased awareness of safety, Decreased awareness of deficits ?  ?Problem Solving: Difficulty sequencing, Requires verbal cues ?  ?  ?  ? ?  ?Exercises General Exercises - Lower Extremity ?Long Arc Quad: Right, 10 reps, Seated ?Hip Flexion/Marching: Both, 10 reps, Seated ? ?  ?General Comments   ?  ?  ? ?Pertinent Vitals/Pain Pain Assessment ?Pain Assessment: Faces ?Faces Pain Scale: Hurts a little bit ?Pain Location: "hips" ?Pain Descriptors / Indicators: Discomfort, Grimacing, Guarding ?Pain Intervention(s): Monitored during session  ? ? ?Home Living   ?  ?  ?  ?  ?  ?  ?  ?  ?  ?   ?  ?Prior Function    ?  ?  ?   ? ?PT Goals (current goals can now be found in the care plan section) Acute Rehab PT Goals ?Patient Stated Goal: return to independence ?Potential to Achieve Goals: Good ?Progress towards PT goals: Progressing toward goals ? ?  ?Frequency ? ? ? Min 4X/week ? ? ? ?  ?PT Plan Current plan remains appropriate  ? ? ?Co-evaluation   ?  ?  ?  ?  ? ?  ?AM-PAC PT "6 Clicks" Mobility   ?Outcome Measure ? Help needed turning from your back to your side while in a flat bed without using bedrails?: A Little ?Help needed moving from lying on your back to sitting on the side of a flat bed without using bedrails?: A Little ?Help needed moving to and from a bed to a chair (including a wheelchair)?: A Little ?Help needed standing up from a chair using your arms (e.g., wheelchair or bedside chair)?: A Little ?Help needed to walk in hospital room?: A Lot ?Help needed climbing 3-5 steps with a railing? : A Lot ?6 Click Score: 16 ? ?  ?End of Session  Equipment Utilized During Treatment: Gait belt ?Activity Tolerance: Patient tolerated treatment well ?Patient left: in chair;with call bell/phone within reach;with family/visitor present ?Nurse Communication: Mobility status ?PT Visit Diagnosis: Other abnormalities of gait and mobility (R26.89);Muscle weakness (generalized) (M62.81);Ataxic gait (R26.0) ?  ? ? ?Time: 8366-2947 ?PT Time Calculation (min) (ACUTE ONLY): 26 min ? ?Charges:  $Gait Training: 8-22 mins ?$Therapeutic Activity: 8-22 mins          ?          ? ?Wyona Almas, PT, DPT ?Acute Rehabilitation Services ?Pager [phone] ?Office [phone] ? ? ? ?Carloine Margo Aye ?01/24/2022, 11:49 AM ? ?

## 2022-01-24 NOTE — Progress Notes (Signed)
Inpatient Rehab Admissions Coordinator:  ? ?Per therapy recommendations,  patient was screened for CIR candidacy by Elayna Tobler, MS, CCC-SLP. At this time, Pt. Appears to be a a potential candidate for CIR. I will place   order for rehab consult per protocol for full assessment. Please contact me any with questions. ? ?Alline Pio, MS, CCC-SLP ?Rehab Admissions Coordinator  ?[phone] (celll) ?[phone] (office) ? ?

## 2022-01-25 ENCOUNTER — Inpatient Hospital Stay (HOSPITAL_COMMUNITY)
Admission: RE | Admit: 2022-01-25 | Discharge: 2022-02-02 | DRG: 945 | Disposition: A | Discharge status: Home / Self Care | Payer: Medicare Other | Source: Intra-hospital | Attending: Physical Medicine and Rehabilitation | Admitting: Physical Medicine and Rehabilitation

## 2022-01-25 ENCOUNTER — Other Ambulatory Visit: Payer: Self-pay

## 2022-01-25 ENCOUNTER — Encounter (HOSPITAL_COMMUNITY): Payer: Self-pay | Admitting: Physical Medicine and Rehabilitation

## 2022-01-25 DIAGNOSIS — S143XXD Injury of brachial plexus, subsequent encounter: Secondary | ICD-10-CM | POA: Diagnosis not present

## 2022-01-25 DIAGNOSIS — R5381 Other malaise: Secondary | ICD-10-CM | POA: Diagnosis present

## 2022-01-25 DIAGNOSIS — D320 Benign neoplasm of cerebral meninges: Secondary | ICD-10-CM | POA: Diagnosis present

## 2022-01-25 DIAGNOSIS — I7121 Aneurysm of the ascending aorta, without rupture: Secondary | ICD-10-CM | POA: Diagnosis present

## 2022-01-25 DIAGNOSIS — Z87891 Personal history of nicotine dependence: Secondary | ICD-10-CM

## 2022-01-25 DIAGNOSIS — Z79899 Other long term (current) drug therapy: Secondary | ICD-10-CM | POA: Diagnosis not present

## 2022-01-25 DIAGNOSIS — M25511 Pain in right shoulder: Secondary | ICD-10-CM | POA: Diagnosis not present

## 2022-01-25 DIAGNOSIS — G936 Cerebral edema: Secondary | ICD-10-CM | POA: Diagnosis present

## 2022-01-25 DIAGNOSIS — Y92009 Unspecified place in unspecified non-institutional (private) residence as the place of occurrence of the external cause: Secondary | ICD-10-CM | POA: Diagnosis not present

## 2022-01-25 DIAGNOSIS — I130 Hypertensive heart and chronic kidney disease with heart failure and stage 1 through stage 4 chronic kidney disease, or unspecified chronic kidney disease: Secondary | ICD-10-CM | POA: Diagnosis present

## 2022-01-25 DIAGNOSIS — R531 Weakness: Secondary | ICD-10-CM | POA: Diagnosis present

## 2022-01-25 DIAGNOSIS — I5022 Chronic systolic (congestive) heart failure: Secondary | ICD-10-CM | POA: Diagnosis present

## 2022-01-25 DIAGNOSIS — W19XXXD Unspecified fall, subsequent encounter: Secondary | ICD-10-CM | POA: Diagnosis not present

## 2022-01-25 DIAGNOSIS — Z833 Family history of diabetes mellitus: Secondary | ICD-10-CM | POA: Diagnosis not present

## 2022-01-25 DIAGNOSIS — E1122 Type 2 diabetes mellitus with diabetic chronic kidney disease: Secondary | ICD-10-CM | POA: Diagnosis present

## 2022-01-25 DIAGNOSIS — I272 Pulmonary hypertension, unspecified: Secondary | ICD-10-CM | POA: Diagnosis present

## 2022-01-25 DIAGNOSIS — R29898 Other symptoms and signs involving the musculoskeletal system: Secondary | ICD-10-CM | POA: Diagnosis not present

## 2022-01-25 DIAGNOSIS — I255 Ischemic cardiomyopathy: Secondary | ICD-10-CM | POA: Diagnosis present

## 2022-01-25 DIAGNOSIS — E785 Hyperlipidemia, unspecified: Secondary | ICD-10-CM | POA: Diagnosis present

## 2022-01-25 DIAGNOSIS — I2510 Atherosclerotic heart disease of native coronary artery without angina pectoris: Secondary | ICD-10-CM | POA: Diagnosis present

## 2022-01-25 DIAGNOSIS — Z95810 Presence of automatic (implantable) cardiac defibrillator: Secondary | ICD-10-CM

## 2022-01-25 DIAGNOSIS — Z808 Family history of malignant neoplasm of other organs or systems: Secondary | ICD-10-CM | POA: Diagnosis not present

## 2022-01-25 DIAGNOSIS — M25512 Pain in left shoulder: Secondary | ICD-10-CM | POA: Diagnosis present

## 2022-01-25 DIAGNOSIS — S46001S Unspecified injury of muscle(s) and tendon(s) of the rotator cuff of right shoulder, sequela: Secondary | ICD-10-CM

## 2022-01-25 DIAGNOSIS — Z79890 Hormone replacement therapy (postmenopausal): Secondary | ICD-10-CM

## 2022-01-25 DIAGNOSIS — I10 Essential (primary) hypertension: Secondary | ICD-10-CM | POA: Diagnosis not present

## 2022-01-25 DIAGNOSIS — I252 Old myocardial infarction: Secondary | ICD-10-CM | POA: Diagnosis not present

## 2022-01-25 DIAGNOSIS — Z955 Presence of coronary angioplasty implant and graft: Secondary | ICD-10-CM | POA: Diagnosis not present

## 2022-01-25 DIAGNOSIS — S14109D Unspecified injury at unspecified level of cervical spinal cord, subsequent encounter: Secondary | ICD-10-CM

## 2022-01-25 DIAGNOSIS — D329 Benign neoplasm of meninges, unspecified: Secondary | ICD-10-CM | POA: Diagnosis not present

## 2022-01-25 DIAGNOSIS — Z806 Family history of leukemia: Secondary | ICD-10-CM | POA: Diagnosis not present

## 2022-01-25 DIAGNOSIS — G8311 Monoplegia of lower limb affecting right dominant side: Secondary | ICD-10-CM | POA: Diagnosis present

## 2022-01-25 DIAGNOSIS — M75101 Unspecified rotator cuff tear or rupture of right shoulder, not specified as traumatic: Secondary | ICD-10-CM | POA: Diagnosis present

## 2022-01-25 DIAGNOSIS — Z86711 Personal history of pulmonary embolism: Secondary | ICD-10-CM | POA: Diagnosis not present

## 2022-01-25 DIAGNOSIS — N1832 Chronic kidney disease, stage 3b: Secondary | ICD-10-CM | POA: Diagnosis present

## 2022-01-25 DIAGNOSIS — R001 Bradycardia, unspecified: Secondary | ICD-10-CM | POA: Diagnosis not present

## 2022-01-25 DIAGNOSIS — W19XXXA Unspecified fall, initial encounter: Secondary | ICD-10-CM | POA: Diagnosis not present

## 2022-01-25 DIAGNOSIS — Y92239 Unspecified place in hospital as the place of occurrence of the external cause: Secondary | ICD-10-CM | POA: Diagnosis present

## 2022-01-25 DIAGNOSIS — T380X5A Adverse effect of glucocorticoids and synthetic analogues, initial encounter: Secondary | ICD-10-CM | POA: Diagnosis present

## 2022-01-25 DIAGNOSIS — D72829 Elevated white blood cell count, unspecified: Secondary | ICD-10-CM | POA: Diagnosis present

## 2022-01-25 DIAGNOSIS — I25810 Atherosclerosis of coronary artery bypass graft(s) without angina pectoris: Secondary | ICD-10-CM | POA: Diagnosis not present

## 2022-01-25 DIAGNOSIS — Z978 Presence of other specified devices: Secondary | ICD-10-CM | POA: Diagnosis not present

## 2022-01-25 DIAGNOSIS — M19011 Primary osteoarthritis, right shoulder: Secondary | ICD-10-CM | POA: Diagnosis present

## 2022-01-25 DIAGNOSIS — R339 Retention of urine, unspecified: Secondary | ICD-10-CM | POA: Diagnosis present

## 2022-01-25 DIAGNOSIS — Z8674 Personal history of sudden cardiac arrest: Secondary | ICD-10-CM

## 2022-01-25 DIAGNOSIS — Z741 Need for assistance with personal care: Secondary | ICD-10-CM | POA: Diagnosis present

## 2022-01-25 DIAGNOSIS — G4700 Insomnia, unspecified: Secondary | ICD-10-CM | POA: Diagnosis present

## 2022-01-25 DIAGNOSIS — D649 Anemia, unspecified: Secondary | ICD-10-CM | POA: Diagnosis present

## 2022-01-25 DIAGNOSIS — Z7982 Long term (current) use of aspirin: Secondary | ICD-10-CM | POA: Diagnosis not present

## 2022-01-25 LAB — BASIC METABOLIC PANEL
Anion gap: 8 (ref 5–15)
BUN: 64 mg/dL — ABNORMAL HIGH (ref 8–23)
CO2: 18 mmol/L — ABNORMAL LOW (ref 22–32)
Calcium: 8.6 mg/dL — ABNORMAL LOW (ref 8.9–10.3)
Chloride: 111 mmol/L (ref 98–111)
Creatinine, Ser: 2.37 mg/dL — ABNORMAL HIGH (ref 0.61–1.24)
GFR, Estimated: 27 mL/min — ABNORMAL LOW (ref >60)
Glucose, Bld: 151 mg/dL — ABNORMAL HIGH (ref 70–99)
Potassium: 4.6 mmol/L (ref 3.5–5.1)
Sodium: 137 mmol/L (ref 135–145)

## 2022-01-25 LAB — MAGNESIUM: Magnesium: 2 mg/dL (ref 1.7–2.4)

## 2022-01-25 MED ORDER — DIPHENHYDRAMINE HCL 12.5 MG/5ML PO ELIX: 12.5000 mg | ORAL_SOLUTION | Freq: Four times a day (QID) | ORAL | Status: DC | PRN

## 2022-01-25 MED ORDER — GUAIFENESIN-DM 100-10 MG/5ML PO SYRP: 5.0000 mL | ORAL_SOLUTION | Freq: Four times a day (QID) | ORAL | Status: DC | PRN

## 2022-01-25 MED ORDER — METOPROLOL SUCCINATE ER 50 MG PO TB24
50.0000 mg | ORAL_TABLET | Freq: Every day | ORAL | Status: DC
Start: 2022-01-26 — End: 2022-01-27
  Administered 2022-01-26 – 2022-01-27 (×2): 50 mg via ORAL
  Filled 2022-01-25 (×2): qty 1

## 2022-01-25 MED ORDER — PROCHLORPERAZINE EDISYLATE 10 MG/2ML IJ SOLN: 5.0000 mg | Freq: Four times a day (QID) | INTRAMUSCULAR | Status: DC | PRN

## 2022-01-25 MED ORDER — DEXAMETHASONE 2 MG PO TABS
2.0000 mg | ORAL_TABLET | Freq: Four times a day (QID) | ORAL | Status: DC
Start: 2022-01-25 — End: 2022-02-02

## 2022-01-25 MED ORDER — TRAZODONE HCL 50 MG PO TABS
25.0000 mg | ORAL_TABLET | Freq: Every evening | ORAL | Status: DC | PRN
  Administered 2022-01-28 – 2022-02-01 (×4): 50 mg via ORAL
  Filled 2022-01-25 (×4): qty 1

## 2022-01-25 MED ORDER — PROCHLORPERAZINE 25 MG RE SUPP
12.5000 mg | Freq: Four times a day (QID) | RECTAL | Status: DC | PRN
Start: 2022-01-25 — End: 2022-02-02

## 2022-01-25 MED ORDER — ACETAMINOPHEN 325 MG PO TABS
325.0000 mg | ORAL_TABLET | ORAL | Status: DC | PRN
  Administered 2022-01-28: 650 mg via ORAL
  Filled 2022-01-25: qty 2

## 2022-01-25 MED ORDER — OXYCODONE HCL 5 MG PO TABS
5.0000 mg | ORAL_TABLET | Freq: Four times a day (QID) | ORAL | Status: DC | PRN
  Administered 2022-01-26: 5 mg via ORAL
  Filled 2022-01-25: qty 1

## 2022-01-25 MED ORDER — POLYETHYLENE GLYCOL 3350 17 G PO PACK: 17.0000 g | PACK | Freq: Every day | ORAL | Status: DC | PRN

## 2022-01-25 MED ORDER — ISOSORBIDE DINITRATE 30 MG PO TABS
30.0000 mg | ORAL_TABLET | Freq: Two times a day (BID) | ORAL | Status: DC
  Administered 2022-01-25 – 2022-02-02 (×16): 30 mg via ORAL
  Filled 2022-01-25 (×17): qty 1

## 2022-01-25 MED ORDER — ASPIRIN 81 MG PO TBEC
81.0000 mg | DELAYED_RELEASE_TABLET | Freq: Every day | ORAL | Status: DC
  Administered 2022-01-26 – 2022-02-02 (×8): 81 mg via ORAL
  Filled 2022-01-25 (×8): qty 1

## 2022-01-25 MED ORDER — SENNOSIDES-DOCUSATE SODIUM 8.6-50 MG PO TABS
1.0000 | ORAL_TABLET | Freq: Every day | ORAL | Status: DC
  Administered 2022-01-25 – 2022-02-01 (×7): 1 via ORAL
  Filled 2022-01-25 (×8): qty 1

## 2022-01-25 MED ORDER — PROCHLORPERAZINE MALEATE 5 MG PO TABS: 5.0000 mg | ORAL_TABLET | Freq: Four times a day (QID) | ORAL | Status: DC | PRN

## 2022-01-25 MED ORDER — ATORVASTATIN CALCIUM 80 MG PO TABS
80.0000 mg | ORAL_TABLET | Freq: Every day | ORAL | Status: DC
  Administered 2022-01-26 – 2022-02-02 (×8): 80 mg via ORAL
  Filled 2022-01-25 (×8): qty 1

## 2022-01-25 MED ORDER — SORBITOL 70 % SOLN: 30.0000 mL | Freq: Every day | Status: DC | PRN

## 2022-01-25 MED ORDER — DEXAMETHASONE 4 MG PO TABS
2.0000 mg | ORAL_TABLET | Freq: Four times a day (QID) | ORAL | Status: DC
  Administered 2022-01-25 – 2022-02-02 (×31): 2 mg via ORAL
  Filled 2022-01-25 (×31): qty 1

## 2022-01-25 MED ORDER — MELATONIN 5 MG PO TABS
5.0000 mg | ORAL_TABLET | Freq: Every day | ORAL | Status: DC
  Administered 2022-01-25 – 2022-02-01 (×8): 5 mg via ORAL
  Filled 2022-01-25 (×8): qty 1

## 2022-01-25 MED ORDER — METHOCARBAMOL 500 MG PO TABS
500.0000 mg | ORAL_TABLET | Freq: Four times a day (QID) | ORAL | Status: DC | PRN
  Administered 2022-01-26: 500 mg via ORAL
  Filled 2022-01-25: qty 1

## 2022-01-25 MED ORDER — FLEET ENEMA 7-19 GM/118ML RE ENEM: 1.0000 | ENEMA | Freq: Once | RECTAL | Status: DC | PRN

## 2022-01-25 MED ORDER — ALUM & MAG HYDROXIDE-SIMETH 200-200-20 MG/5ML PO SUSP: 30.0000 mL | ORAL | Status: DC | PRN

## 2022-01-25 NOTE — Discharge Summary (Signed)
? ?Physician Discharge Summary  ?Timothy Moran SWN:[id number] DOB: 01-08-1946 DOA: 01/21/2022 ? ?PCP: Cari Caraway, MD ? ?Admit date: 01/21/2022 ?Discharge date: 01/25/2022 ? ?Admitted From: home ?Disposition:  CIR ? ?Recommendations for Outpatient Follow-up:  ?Follow up with PCP in 1-2 weeks ? ?Home Health: none ?Equipment/Devices: none ? ?Discharge Condition: stable ?CODE STATUS: Full code ?Diet Orders (From admission, onward)  ? ?  Start     Ordered  ? 01/22/22 1425  Diet Heart Room service appropriate? Yes; Fluid consistency: Thin  Diet effective now       ?Question Answer Comment  ?Room service appropriate? Yes   ?Fluid consistency: Thin   ?  ? 01/22/22 1424  ? ?  ?  ? ?  ? ? ?HPI: Per admitting MD, ?76 year old male with a history of CKD stage III, history of cardiac arrest back in January 2022 secondary to STEMI, status post recent BiV pacer placement, urinary tension with chronic indwelling Foley, history of PE no longer on anticoagulation, history of multiple meningiomas treated at [facility] who presents to the ER today as a trauma.  Patient states that he was at his friend's house with his wife.  He states that they were enjoying the evening and were leaving this friend's house.  As he was walking out the door, he stepped out onto the walkway leading up to their house.  He describes it as an elevated brick sidewalk.  This is about 18 inches above the ground.  He states that he tried to step out of the way to let people out the front door and stepped off the sidewalk.  He fell backwards and hit the back of his head against the house.  He states that he hyperflexed his neck.  He also struck his right shoulder against the house.  He states that he was unable to move his right arm. EMS was called.  They were able to get him onto a stretcher.  He was brought to the ER for evaluation. CT head showed a large meningioma with possible vasogenic edema.  Neurosurgery and neurology were consulted.  Neurosurgery felt that the patient should receive IV Decadron and an MRI. ? ?Hospital Course / Discharge diagnoses: ?Principal Problem: ?  Fall at home ?Active Problems: ?  Meningioma ([place]) ?  Personal history of pulmonary embolism - no longer on anticoagulation due to pt refusal to take it anymore. ?  Chronic kidney disease, stage III (moderate) (HCC) ?  Urinary retention ?  Chronic HFrEF (heart failure with reduced ejection fraction) ([place]) ?  Status post biventricular pacemaker - MRI compatible PPM and leads ?  Chronic indwelling Foley catheter ?  Right sided weakness ? ? ?Principal problem ?Right-sided weakness-following a fall at home.  MRI of the brain as well as C-spine without acute findings, but shows his known meningiomas with slightly worsening surrounding edema.  Its not exactly clear as to why his right-sided is weaker after the fall, neurology and neurosurgery consulted, possibly cord compression, brain concussion in the face of the cerebral edema, possible brachial plexus stretch injury.  He has been placed on Decadron, continue.  Still has right-sided weakness.  He will go to CIR, continue to taper off Decadron ?  ?Active problems ?Meningiomas-as per MRI below.  He had gamma knife radiation at [facility], he is having follow-up as an outpatient soon ?Chronic systolic CHF-currently euvolemic ?Status post biventricular pacemaker-no active issues ?Personal history of PE-he is no longer on anticoagulation for the past several  years ?Chronic kidney disease stage IIIb -baseline range 2.2-2.4, currently at baseline ?Chronic urinary retention-has a chronic Foley since January 2022 ?History of cardiac arrest-2022 ?Essential hypertension-discontinue ARB due to renal failure  ?Hyperlipidemia-continue statin ? ?Sepsis ruled out ? ? ?Discharge Instructions ? ?Discharge Instructions   ? ? Ambulatory referral to Neurology   Complete by: As directed ?  ? An appointment is requested in approximately: 6 wks   ? ?  ? ?Allergies as of 01/25/2022   ?No Known Allergies ?  ? ?  ?Medication List  ?  ? ?STOP taking these medications   ? ?clopidogrel 75 MG tablet ?Commonly known as: PLAVIX ?  ?valsartan 160 MG tablet ?Commonly known as: DIOVAN ?  ? ?  ? ?TAKE these medications   ? ?ACID CONTROL PO ?Take 50 mg by mouth daily. ?  ?aspirin EC 81 MG tablet ?Take 81 mg by mouth daily. Swallow whole. ?  ?atorvastatin 80 MG tablet ?Commonly known as: LIPITOR ?Take 1 tablet (80 mg total) by mouth daily. ?  ?dexamethasone 2 MG tablet ?Commonly known as: DECADRON ?Take 1 tablet (2 mg total) by mouth every 6 (six) hours. ?  ?hydrALAZINE 10 MG tablet ?Commonly known as: APRESOLINE ?Take 1 tablet (10 mg total) by mouth 3 (three) times daily. ?  ?isosorbide dinitrate 30 MG tablet ?Commonly known as: ISORDIL ?Take 1 tablet (30 mg total) by mouth 2 (two) times daily. ?  ?melatonin 5 MG Tabs ?Take 1 tablet (5 mg total) by mouth at bedtime. ?  ?metoprolol succinate 50 MG 24 hr tablet ?Commonly known as: TOPROL-XL ?TAKE 1 TABLET DAILY, WITH  OR IMMEDIATELY FOLLOWING A MEAL ?What changed:  ?how much to take ?how to take this ?when to take this ?additional instructions ?  ?multivitamin with minerals Tabs tablet ?Take 1 tablet by mouth daily. ?What changed: additional instructions ?  ?SUPER B COMPLEX PO ?Take 1 tablet by mouth daily. Unknown strength ?  ? ?  ? ? Follow-up Information   ? ? Cari Caraway, MD.   ?Specialty: Family Medicine ?Contact information: ?[address] ?[place] [address] ?[phone] ? ? ?  ?  ? ?  ?  ? ?  ? ? ?Consultations: ?Neurosurgery ?Neurology  ? ?Procedures/Studies: ? ?DG Lumbar Spine Complete ? ?Result Date: 01/21/2022 ?CLINICAL DATA:  Fall, low back pain. EXAM: LUMBAR SPINE - COMPLETE 4+ VIEW COMPARISON:  08/26/2021. FINDINGS: There is no evidence of lumbar spine fracture. Alignment is normal. Intervertebral disc space narrowing is present at L5-S1. Moderate, degenerative endplate changes and facet  arthropathy is noted at multiple levels in the lumbar spine and lower thoracic spine. Total hip arthroplasty changes are noted on the left. There are mild degenerative changes at the right hip. Aortic atherosclerosis is noted. There is evidence of prior hernia repair in the right lower quadrant. IMPRESSION: Moderate lumbar spondylosis.  No acute osseous abnormality is seen. Electronically Signed   By: Brett Fairy M.D.   On: 01/21/2022 23:35  ? ?CT HEAD WO CONTRAST (5MM) ? ?Result Date: 01/21/2022 ?CLINICAL DATA:  Fall.  Head trauma, minor (Age >= 65y) EXAM: CT HEAD WITHOUT CONTRAST TECHNIQUE: Contiguous axial images were obtained from the base of the skull through the vertex without intravenous contrast. RADIATION DOSE REDUCTION: This exam was performed according to the departmental dose-optimization program which includes automated exposure control, adjustment of the mA and/or kV according to patient size and/or use of iterative reconstruction technique. COMPARISON:  10/04/2020 FINDINGS: Brain: Left parafalcine meningioma again noted  measuring 2.4 x 2.4 cm, stable since prior study. There is increasing surrounding edema within the posterior left frontal and parietal lobes. No mass effect or midline shift. No hydrocephalus. No hemorrhage or acute infarction. Vascular: No hyperdense vessel or unexpected calcification. Skull: No acute calvarial abnormality. Sinuses/Orbits: No acute findings Other: None IMPRESSION: Left parafalcine meningioma again seen measuring up to 2.4 cm. Increasing edema within the left posterior frontal and parietal lobes since prior study. No significant mass effect or midline shift. Electronically Signed   By: Rolm Baptise M.D.   On: 01/21/2022 23:30  ? ?CT Cervical Spine Wo Contrast ? ?Result Date: 01/21/2022 ?CLINICAL DATA:  Trauma fall EXAM: CT CERVICAL SPINE WITHOUT CONTRAST TECHNIQUE: Multidetector CT imaging of the cervical spine was performed without intravenous contrast. Multiplanar CT  image reconstructions were also generated. RADIATION DOSE REDUCTION: This exam was performed according to the departmental dose-optimization program which includes automated exposure control, adjustment of the mA and/or

## 2022-01-25 NOTE — Progress Notes (Signed)
PMR Admission Coordinator Pre-Admission Assessment ?  ?Patient: Timothy Moran is an 76 y.o., male ?MRN: [id number] ?DOB: December 10, 1945 ?Height: '6\' 1"'$  (185.4 cm) ?Weight: 99.8 kg ?  ?Insurance Information ?HMO:     PPO:      PCP:      IPA:      80/20:      OTHER:  ?PRIMARY: Medicare A/B      Policy#: [id number]      Subscriber: pt ?CM Name:       Phone#:      Fax#:  ?Pre-Cert#: verified online      Employer:  ?Benefits:  Phone #:      Name:  ?Eff. Date: A 03/12/10, B 11/11/10     Deduct: $1600      Out of Pocket Max: n/a      Life Max: n/a ?CIR: 100%      SNF: 20 full days ?Outpatient: 80%     Co-Ins: 20% ?Home Health: 100%      Co-Pay:  ?DME: 80%     Co-Ins: 20% ?Providers:  ?SECONDARY: [employer]      Policy#: [id number]     Phone#: [phone] ?  ?Financial Counselor:       Phone#:  ?  ?The ?Data Collection Information Summary? for patients in Inpatient Rehabilitation Facilities with attached ?Privacy Act [place] Records? was provided and verbally reviewed with: Patient and Family ?  ?Emergency Contact Information ?Contact Information   ?  ?  Name Relation Home Work Mobile  ?  shermaine, brigham [phone]   [phone]  ?  ?   ?  ?  ?Current Medical History  ?Patient Admitting Diagnosis: neuropraxia, debility ?  ?History of Present Illness: Pt is a 76 y/o male with PMH of CKD III, cardiac arrest (1/22) secondary to STEMI, s/p BiV pacer placement, urinary tension with chronic foley, PE (not on [facility]), and multiple meningiomas treated at [facility].  He presented to [facility] on 01/21/22 following a fall at home, striking his shoulder and head with hyperflexion of his neck, and subsequent R sided weakness.  CTH showed a large meningioma with vasogenic edema.  Neurosurgery/neurology consulted and NS recommended starting IV decadron.  Cervical MRI demonstrated mild bulging without significant neural compressions.  Neurosurgery felt more neuroapraxia than weakness in RUE, likely related to brain edema.   Therapy ongoing and recommendations are for CIR.  ?  ?Complete NIHSS TOTAL: 2 ?  ?[facility] has been reviewed by the rehabilitation admission coordinator and physician. ?  ?Past Medical History  ?    ?Past Medical History:  ?Diagnosis Date  ? Acute blood loss anemia 10/05/2020  ? Acute respiratory failure ([place]), hypothermia therapy, vent - extubated 10/06/20    ? AKI (acute kidney injury) ([place]) 10/05/2020  ? Anoxic brain injury ([place]) 10/26/2020  ? Arrhythmia    ? Ascending aortic aneurysm ([place]) 01/29/2018  ?  a. 2019 4.3 cm by echo; b. 02/2021 Echo: Ao root 69m.  ? Benign brain tumor ([place]    ? Benign neoplasm of brain (HWoodland [place] 12/19/2017  ? Cardiac arrest with ventricular fibrillation (HTur[place]    ? Chest pain 12/04/2017  ? Chronic kidney disease, stage III (moderate) (H[place] 12/19/2017  ? CKD (chronic kidney disease), stage III - IV (HAurora    ? Coronary Artery Disease    ?  a. 16/3785Inf STEMI complicated by cardiac arrest/PCI: LAD 50p/m, LCX 100d (2.5 x 30 Resolute Onyx DES); b. 04/2021 PCI: 04/2021  LM nl, LAD 50p/37m(2.5x30 Onyx Frontier DES), LCX 25p, 510mRFR 0.98), 40d ISR (RFR 0.98), OM2 25, RCA mild diff dzs.  ? Diabetes mellitus, type 2 (HCNew Bedford01/25/2022  ?  10/06/20 A1C 7%  ? Dizziness 10/11/2019  ? Encounter for imaging study to confirm orogastric (OG) tube placement    ? Hematuria 10/26/2020  ? HFrEF (heart failure with reduced ejection fraction) (HCWallace   ?  a. 11/2020 Echo: EF 25-30%; b. 02/2021 Echo: EF 25-30%, glob HK. Mild LVH. Nl RV fxn. Triv AI. Ao root 4073m ? History of pulmonary embolus (PE)    ? HLD (hyperlipidemia) 10/26/2020  ? Hypertension    ? Iron deficiency anemia rec'd IV iron 10/26/2020  ? Ischemic cardiomyopathy    ?  a. 11/2020 Echo: EF 25-30%; b. 02/2021 Echo: EF 25-30%, glob HK.  ? Left bundle branch block 12/19/2017  ? Meningioma (HCCManassas2/11/2019  ? Nonsustained ventricular tachycardia (HCCDover1/29/2021  ? Pain of left hip joint 05/29/2020  ? Personal history of  pulmonary embolism 12/19/2017  ? Pneumonia of both lungs due to infectious organism    ? Pulmonary hypertension due to thromboembolism (HCCWoodlawn6/28/2019  ?  See echo 12/27/17 with nl PAS vs CTa chest 02/23/18   ? S/P angioplasty with stent 10/04/20 DES to LCX  10/26/2020  ? Sinus bradycardia 12/22/2017  ? SOB (shortness of breath)    ? Solitary pulmonary nodule on lung CT 03/08/2018  ?  CT 12/04/17 1.0 x 0.8 x 0.7 cm nodular opacity in the RUL vs not seen 03/16/10 posterior segment of the right upper lobe. .SpMarland Kitchenrometry 03/08/2018    FEV1 3.86 (108%)  Ratio 96 s prior rx  - PET  03/13/18   Low grade c/w adenoca > rec T surgery eval    ? STEMI (ST elevation myocardial infarction) (HCCGranville1/23/2022  ? Urinary retention 10/26/2020  ? Vestibular schwannoma (HCCParkersburg2/11/2019  ?  ?  ?Has the patient had major surgery during 100 days prior to admission? No ?  ?Family History   ?family history includes Brain cancer in his sister; Diabetes in his mother; Leukemia in his brother; Melanoma in his brother. ?  ?Current Medications ?  ?Current Facility-Administered Medications:  ?  acetaminophen (TYLENOL) tablet 650 mg, 650 mg, Oral, Q6H PRN, HalIrene Pap DO ?  aspirin EC tablet 81 mg, 81 mg, Oral, Daily, GheCaren GriffinsD, 81 mg at 01/25/22 [id number] atorvastatin (LIPITOR) tablet 80 mg, 80 mg, Oral, Daily, GheCaren GriffinsD, 80 mg at 01/25/22 [id number] Chlorhexidine Gluconate Cloth 2 % PADS 6 each, 6 each, Topical, Daily, GheCaren GriffinsD, 6 each at 01/24/22 0949 ?  dexamethasone (DECADRON) tablet 2 mg, 2 mg, Oral, Q6H, GheCaren GriffinsD, 2 mg at 01/25/22 [id number] HYDROmorphone (DILAUDID) injection 0.5 mg, 0.5 mg, Intravenous, Q4H PRN, Hall, Carole N, DO ?  iohexol (OMNIPAQUE) 350 MG/ML injection 100 mL, 100 mL, Intravenous, Once PRN, KhaDonnetta SimpersD ?  isosorbide dinitrate (ISORDIL) tablet 30 mg, 30 mg, Oral, BID, GheCaren GriffinsD, 30 mg at 01/25/22 [id number] melatonin tablet 5 mg, 5 mg, Oral, QHS, GheCaren GriffinsMD, 5 mg at 01/24/22 2122 ?  melatonin tablet 5 mg, 5 mg, Oral, QHS PRN, HalIrene Pap DO ?  metoprolol succinate (TOPROL-XL) 24 hr tablet 50 mg, 50 mg, Oral, Q breakfast, GheCaren GriffinsD, 50 mg at 01/25/22 0930938 oxyCODONE (Oxy IR/ROXICODONE)  immediate release tablet 5 mg, 5 mg, Oral, Q6H PRN, Kayleen Memos, DO, 5 mg at 01/24/22 2122 ?  polyethylene glycol (MIRALAX / GLYCOLAX) packet 17 g, 17 g, Oral, Daily PRN, Irene Pap N, DO ?  prochlorperazine (COMPAZINE) injection 10 mg, 10 mg, Intravenous, Q6H PRN, Hall, Carole N, DO ?  senna-docusate (Senokot-S) tablet 1 tablet, 1 tablet, Oral, QHS, Hall, [place], DO, 1 tablet at 01/24/22 2122 ?  ?Patients Current Diet:  ?Diet Order   ?  ?         ?    Diet Heart Room service appropriate? Yes; Fluid consistency: Thin  Diet effective now       ?  ?  ?   ?  ?  ?   ?  ?  ?Precautions / Restrictions ?Precautions ?Precautions: Fall ?Precaution Comments: admitted s/p fall + head injury ?Restrictions ?Weight Bearing Restrictions: No  ?  ?Has the patient had 2 or more falls or a fall with injury in the past year? Yes ?  ?Prior Activity Level ?Limited Community (1-2x/wk): prior to fall was independent without DME, driving, not particularly active but able to manage yard work, Social research officer, government. ?  ?Prior Functional Level ?Self Care: Did the patient need help bathing, dressing, using the toilet or eating? Independent ?  ?Indoor Mobility: Did the patient need assistance with walking from room to room (with or without device)? Independent ?  ?Stairs: Did the patient need assistance with internal or external stairs (with or without device)? Independent ?  ?Functional Cognition: Did the patient need help planning regular tasks such as shopping or remembering to take medications? Independent ?  ?Patient Information ?Are you of Hispanic, Latino/a,or Spanish origin?: A. No, not of Hispanic, Latino/a, or Spanish origin ?What is your race?: A. White ?Do you need or want an interpreter to  communicate with a doctor or health care staff?: 0. No ?  ?Patient's Response To:  ?Health Literacy and Transportation ?Is the patient able to respond to health literacy and transportation needs?: Yes ?Health L

## 2022-01-25 NOTE — Progress Notes (Signed)
Inpatient Rehabilitation Admission Medication Review by a Pharmacist ? ?A complete drug regimen review was completed for this patient to identify any potential clinically significant medication issues. ? ?High Risk Drug Classes Is patient taking? Indication by Medication  ?Antipsychotic Yes Compazine- N/V  ?Anticoagulant No   ?Antibiotic No   ?Opioid Yes OxyIR- acute pain  ?Antiplatelet Yes Aspirin- CVA prophylaxis  ?Hypoglycemics/insulin No   ?Vasoactive Medication Yes Isordil, Toprol- angina, hypertension  ?Chemotherapy No   ?Other Yes Lipitor- HLD  ? ? ? ?Type of Medication Issue Identified Description of Issue Recommendation(s)  ?Drug Interaction(s) (clinically significant) ?    ?Duplicate Therapy ?    ?Allergy ?    ?No Medication Administration End Date ?    ?Incorrect Dose ?    ?Additional Drug Therapy Needed ?    ?Significant med changes from prior encounter (inform family/care partners about these prior to discharge).    ?Other ? PTA meds: ?Hydralazine ?Zantac ?Diovan Restart PTA meds when and if clinically necessary during CIR admission or at time of discharge, if warranted.   ? ? ?Clinically significant medication issues were identified that warrant physician communication and completion of prescribed/recommended actions by midnight of the next day:  No ? ? ?Time spent performing this drug regimen review (minutes):  30 ? ? ?Diondre Pulis BS, PharmD, BCPS ?Clinical Pharmacist ?01/26/2022 7:13 AM ? ?Contact: [phone] after 3 PM ? ?"Be curious, not judgmental..." -Jamal Maes ?

## 2022-01-25 NOTE — TOC Transition Note (Signed)
Transition of Care (TOC) - CM/SW Discharge Note ?Marvetta Gibbons Therapist, sports, BSN ?Transitions of Care ?Unit 4E- RN Case Manager ?See Treatment Team for direct phone #  ?Cross Coverage for 5N ? ?Patient Details  ?Name: Timothy Moran ?MRN: [id number] ?Date of Birth: 06/13/1946 ? ?Transition of Care (TOC) CM/SW Contact:  ?Dahlia Client, Romeo Rabon, RN ?Phone Number: ?01/25/2022, 10:15 AM ? ? ?Clinical Narrative:    ?Notified this am by Urban Gibson with [facility] IN[facility] re[facility]- pt has bed available today for INPT rehab admission. Pt cleared by MD for transition to [place] rehab.  ?Plan to transition later today to available bed.  ? ? ?Final next level of care: [place] ?Barriers to Discharge: No Barriers Identified ? ? ?Patient Goals and CMS Choice ?  ? INPT rehab ?  ? ?Discharge Placement ?  ?           ?  ? [facility] re[facility] ?  ?  ? ?Discharge Plan and Services ?  ?  ?Post Acute Care Choice: IP Rehab          ?DME Arranged: N/A ?DME Agency: NA ?  ?  ?  ?HH Arranged: NA ?[place] Agency: NA ?  ?  ?  ? ?Social Determinants of Health (SDOH) Interventions ?  ? ? ?Readmission Risk Interventions ? ?  01/25/2022  ? 10:15 AM 10/29/2020  ?  3:32 PM  ?Readmission Risk Prevention Plan  ?Transportation Screening Complete Complete  ?PCP or Specialist Appt within 5-7 Days Complete   ?Home Care Screening Complete   ?Medication Review (RN CM) Complete   ?Medication Review Press photographer)  Complete  ?PCP or Specialist appointment within 3-5 days of discharge  Not Complete  ?PCP/Specialist Appt Not Complete comments  going to [facility] rehab  ?[place] or Home Care Consult  Complete  ?SW Recovery Care/Counseling Consult  Complete  ?Palliative Care Screening  Not Applicable  ?[place]  Not Applicable  ? ? ? ? ? ?

## 2022-01-25 NOTE — PMR Pre-admission (Signed)
PMR Admission Coordinator Pre-Admission Assessment ? ?Patient: Timothy Moran is an 76 y.o., male ?MRN: [id number] ?DOB: 1946/07/01 ?Height: '6\' 1"'$  (185.4 cm) ?Weight: 99.8 kg ? ?Insurance Information ?HMO:     PPO:      PCP:      IPA:      80/20:      OTHER:  ?PRIMARY: Medicare A/B      Policy#: [id number]      Subscriber: pt ?CM Name:       Phone#:      Fax#:  ?Pre-Cert#: verified online      Employer:  ?Benefits:  Phone #:      Name:  ?Eff. Date: A 03/12/10, B 11/11/10     Deduct: $1600      Out of Pocket Max: n/a      Life Max: n/a ?CIR: 100%      SNF: 20 full days ?Outpatient: 80%     Co-Ins: 20% ?Home Health: 100%      Co-Pay:  ?DME: 80%     Co-Ins: 20% ?Providers:  ?SECONDARY: [employer]      Policy#: [id number]     Phone#: [phone] ? ?Financial Counselor:       Phone#:  ? ?The ?Data Collection Information Summary? for patients in Inpatient Rehabilitation Facilities with attached ?Privacy Act [place] Records? was provided and verbally reviewed with: Patient and Family ? ?Emergency Contact Information ?Contact Information   ? ? Name Relation Home Work Mobile  ? kwamaine, cuppett [phone]  [phone]  ? ?  ? ? ?Current Medical History  ?Patient Admitting Diagnosis: neuropraxia, debility ? ?History of Present Illness: Pt is a 76 y/o male with PMH of CKD III, cardiac arrest (1/22) secondary to STEMI, s/p BiV pacer placement, urinary tension with chronic foley, PE (not on [facility]), and multiple meningiomas treated at [facility].  He presented to [facility] on 01/21/22 following a fall at home, striking his shoulder and head with hyperflexion of his neck, and subsequent R sided weakness.  CTH showed a large meningioma with vasogenic edema.  Neurosurgery/neurology consulted and NS recommended starting IV decadron.  Cervical MRI demonstrated mild bulging without significant neural compressions.  Neurosurgery felt more neuroapraxia than weakness in RUE, likely related to brain edema.  Therapy ongoing  and recommendations are for CIR.  ? ?Complete NIHSS TOTAL: 2 ? ?[facility] has been reviewed by the rehabilitation admission coordinator and physician. ? ?Past Medical History  ?Past Medical History:  ?Diagnosis Date  ? Acute blood loss anemia 10/05/2020  ? Acute respiratory failure ([place]), hypothermia therapy, vent - extubated 10/06/20   ? AKI (acute kidney injury) ([place]) 10/05/2020  ? Anoxic brain injury ([place]) 10/26/2020  ? Arrhythmia   ? Ascending aortic aneurysm ([place]) 01/29/2018  ? a. 2019 4.3 cm by echo; b. 02/2021 Echo: Ao root 62m.  ? Benign brain tumor (HBayou Gauche   ? Benign neoplasm of brain (HBeech Mountain Lakes 12/19/2017  ? Cardiac arrest with ventricular fibrillation (HKaka   ? Chest pain 12/04/2017  ? Chronic kidney disease, stage III (moderate) (HSan Francisco 12/19/2017  ? CKD (chronic kidney disease), stage III - IV (HStonington   ? Coronary Artery Disease   ? a. 15/0354Inf STEMI complicated by cardiac arrest/PCI: LAD 50p/m, LCX 100d (2.5 x 30 Resolute Onyx DES); b. 04/2021 PCI: 04/2021 LM nl, LAD 50p/828m2.5x30 Onyx Frontier DES), LCX 25p, 5019mFR 0.98), 40d ISR (RFR 0.98), OM2 25, RCA mild diff dzs.  ? Diabetes mellitus, type 2 (  [place]) 10/06/2020  ? 10/06/20 A1C 7%  ? Dizziness 10/11/2019  ? Encounter for imaging study to confirm orogastric (OG) tube placement   ? Hematuria 10/26/2020  ? HFrEF (heart failure with reduced ejection fraction) ([place])   ? a. 11/2020 Echo: EF 25-30%; b. 02/2021 Echo: EF 25-30%, glob HK. Mild LVH. Nl RV fxn. Triv AI. Ao root 89m.  ? History of pulmonary embolus (PE)   ? HLD (hyperlipidemia) 10/26/2020  ? Hypertension   ? Iron deficiency anemia rec'd IV iron 10/26/2020  ? Ischemic cardiomyopathy   ? a. 11/2020 Echo: EF 25-30%; b. 02/2021 Echo: EF 25-30%, glob HK.  ? Left bundle branch block 12/19/2017  ? Meningioma (HNew[place] 08/14/2020  ? Nonsustained ventricular tachycardia (HFanning Springs 10/11/2019  ? Pain of left hip joint 05/29/2020  ? Personal history of pulmonary embolism 12/19/2017  ? Pneumonia of  both lungs due to infectious organism   ? Pulmonary hypertension due to thromboembolism (HGrand Cane 03/09/2018  ? See echo 12/27/17 with nl PAS vs CTa chest 02/23/18   ? S/P angioplasty with stent 10/04/20 DES to LCX  10/26/2020  ? Sinus bradycardia 12/22/2017  ? SOB (shortness of breath)   ? Solitary pulmonary nodule on lung CT 03/08/2018  ? CT 12/04/17 1.0 x 0.8 x 0.7 cm nodular opacity in the RUL vs not seen 03/16/10 posterior segment of the right upper lobe. .Marland Kitchenpirometry 03/08/2018    FEV1 3.86 (108%)  Ratio 96 s prior rx  - PET  03/13/18   Low grade c/w adenoca > rec T surgery eval    ? STEMI (ST elevation myocardial infarction) (HCatoosa 10/04/2020  ? Urinary retention 10/26/2020  ? Vestibular schwannoma (HBethany 08/14/2020  ? ? ?Has the patient had major surgery during 100 days prior to admission? No ? ?Family History   ?family history includes Brain cancer in his sister; Diabetes in his mother; Leukemia in his brother; Melanoma in his brother. ? ?Current Medications ? ?Current Facility-Administered Medications:  ?  acetaminophen (TYLENOL) tablet 650 mg, 650 mg, Oral, Q6H PRN, HIrene PapN, DO ?  aspirin EC tablet 81 mg, 81 mg, Oral, Daily, GCaren Griffins MD, 81 mg at 01/25/22 08099?  atorvastatin (LIPITOR) tablet 80 mg, 80 mg, Oral, Daily, GCaren Griffins MD, 80 mg at 01/25/22 08338?  Chlorhexidine Gluconate Cloth 2 % PADS 6 each, 6 each, Topical, Daily, GCaren Griffins MD, 6 each at 01/24/22 0949 ?  dexamethasone (DECADRON) tablet 2 mg, 2 mg, Oral, Q6H, GCaren Griffins MD, 2 mg at 01/25/22 02505?  HYDROmorphone (DILAUDID) injection 0.5 mg, 0.5 mg, Intravenous, Q4H PRN, Hall, Carole N, DO ?  iohexol (OMNIPAQUE) 350 MG/ML injection 100 mL, 100 mL, Intravenous, Once PRN, KDonnetta Simpers MD ?  isosorbide dinitrate (ISORDIL) tablet 30 mg, 30 mg, Oral, BID, GCaren Griffins MD, 30 mg at 01/25/22 03976?  melatonin tablet 5 mg, 5 mg, Oral, QHS, GCaren Griffins MD, 5 mg at 01/24/22 2122 ?  melatonin tablet 5 mg, 5  mg, Oral, QHS PRN, HIrene PapN, DO ?  metoprolol succinate (TOPROL-XL) 24 hr tablet 50 mg, 50 mg, Oral, Q breakfast, GCaren Griffins MD, 50 mg at 01/25/22 07341?  oxyCODONE (Oxy IR/ROXICODONE) immediate release tablet 5 mg, 5 mg, Oral, Q6H PRN, HKayleen Memos DO, 5 mg at 01/24/22 2122 ?  polyethylene glycol (MIRALAX / GLYCOLAX) packet 17 g, 17 g, Oral, Daily PRN, HIrene PapN, DO ?  prochlorperazine (COMPAZINE) injection 10 mg, 10  mg, Intravenous, Q6H PRN, Kayleen Memos, DO ?  senna-docusate (Senokot-S) tablet 1 tablet, 1 tablet, Oral, QHS, Kayleen Memos, DO, 1 tablet at 01/24/22 2122 ? ?Patients Current Diet:  ?Diet Order   ? ?       ?  Diet Heart Room service appropriate? Yes; Fluid consistency: Thin  Diet effective now       ?  ? ?  ?  ? ?  ? ? ?Precautions / Restrictions ?Precautions ?Precautions: Fall ?Precaution Comments: admitted s/p fall + head injury ?Restrictions ?Weight Bearing Restrictions: No  ? ?Has the patient had 2 or more falls or a fall with injury in the past year? Yes ? ?Prior Activity Level ?Limited Community (1-2x/wk): prior to fall was independent without DME, driving, not particularly active but able to manage yard work, Social research officer, government. ? ?Prior Functional Level ?Self Care: Did the patient need help bathing, dressing, using the toilet or eating? Independent ? ?Indoor Mobility: Did the patient need assistance with walking from room to room (with or without device)? Independent ? ?Stairs: Did the patient need assistance with internal or external stairs (with or without device)? Independent ? ?Functional Cognition: Did the patient need help planning regular tasks such as shopping or remembering to take medications? Independent ? ?Patient Information ?Are you of Hispanic, Latino/a,or Spanish origin?: A. No, not of Hispanic, Latino/a, or Spanish origin ?What is your race?: A. White ?Do you need or want an interpreter to communicate with a doctor or health care staff?: 0. No ? ?Patient's Response  To:  ?Health Literacy and Transportation ?Is the patient able to respond to health literacy and transportation needs?: Yes ?Health Literacy - How often do you need to have someone help you when you r

## 2022-01-25 NOTE — Progress Notes (Signed)
Occupational Therapy Treatment Note - late entry ? ?Pt with apparent neuropraxia, requiring mod A with mobility and  ADL tasks at RW level. Very high fall risk. Would greatly benefit from intensive rehab at [facility]. Movement patterns improve significantly with repetition and use of weighted resistance. Would assess movement patterns with use of wrist and ankle weights during mobility and ADL tasks. Will continue to follow acutely. ? ? ? 01/24/22 1450  ?OT Visit Information  ?Last OT Received On 01/24/22  ?Assistance Needed +1  ?History of Present Illness Patient is a 76 yo male presenting to the ED status post fall onto R shoulder R leg and head on 01/21/22. Patient could not move R leg or shoulder post fall. MRI C-spine shows no injury to the cord or spine, no ligamentous injury,  PMH incudes: left parafalcine brain meningioma with vasogenic edema (being followed at [facility]), CKD 3, history of cardiac arrest in January 2022 due to STEMI, recent biventricular pacemaker placed February 2023, chronic urinary retention with chronic Foley, history of remote PE no longer anticoagulation  ?Precautions  ?Precautions Fall  ?Pain Assessment  ?Pain Assessment Faces  ?Faces Pain Scale 2  ?Pain Location "hips"  ?Pain Descriptors / Indicators Discomfort;Grimacing;Guarding  ?Pain Intervention(s) Limited activity within patient's tolerance  ?Cognition  ?Arousal/Alertness Awake/alert  ?Behavior During Therapy [facility] for tasks assessed/performed  ?Overall Cognitive Status Impaired/Different from baseline  ?Area of Impairment Safety/judgement;Awareness;Attention  ?Current Attention Level Selective  ?Safety/Judgement Decreased awareness of safety;Decreased awareness of deficits  ?Awareness Emergent  ?Problem Solving Slow processing  ?General Comments will further assess; undergoing treatment for brain tumor  ?Upper Extremity Assessment  ?Upper Extremity Assessment RUE deficits/detail  ?RUE Deficits / Details Complaning of shoulder  and elbow pain/soreness form fall however ROM overall WFL; Pt with neuropraxia, making it difficult to use RUE funcitonally due to poor control. Staets he can't"feel" his arm. Improved movemetn patterns with increased proprioceptive input. Would likely do well with weights on arm during functional tasks  ?Lower Extremity Assessment  ?Lower Extremity Assessment Defer to PT evaluation  ?ADL  ?Overall ADL's  Needs assistance/impaired  ?Lower Body Dressing Moderate assistance  ?Toilet Transfer Moderate assistance  ?Toilet Transfer Details (indicate cue type and reason) Significant ataxia with movement  ?Functional mobility during ADLs Moderate assistance;Rolling walker (2 wheels)  ?Bed Mobility  ?Overal bed mobility Needs Assistance  ?Bed Mobility Supine to Sit;Sit to Supine  ?Supine to sit Supervision  ?Sit to supine Min assist  ?Transfers  ?Overall transfer level Needs assistance  ?Equipment used Rolling walker (2 wheels)  ?Sit to Stand Min assist  ?General transfer comment unaware of position of RLE during trnasfer; improved with repetition  ?Balance  ?Overall balance assessment Needs assistance  ?Sitting balance-Leahy Scale Fair  ?Standing balance-Leahy Scale Poor  ?Other Exercises  ?Other Exercises RUE shoulder flexion/abduction; elbow flex/ext against resistance x 5 each movemetn to increase control and funcitonal movement patters  ?Other Exercises sit - stand repetitively with R hand on RW and L hand pushing form bed  ?Other Exercises rythmic stabilization exercises focusing on core control in standing position  ?OT - End of Session  ?Equipment Utilized During Treatment Gait belt;Rolling walker (2 wheels)  ?Activity Tolerance Patient tolerated treatment well  ?Patient left in bed;with call bell/phone within reach;with bed alarm set;with family/visitor present  ?Nurse Communication Mobility status  ?OT Assessment/Plan  ?OT Plan Discharge plan remains appropriate  ?OT Visit Diagnosis Unsteadiness on feet  (R26.81);Other abnormalities of gait and mobility (R26.89);Muscle weakness (generalized) (  M62.81);Pain;Ataxia, unspecified (R27.0)  ?Pain - Right/Left Right  ?Pain - part of body Shoulder;Arm;Hip  ?OT Frequency (ACUTE ONLY) Min 3X/week  ?Recommendations for Other Services Rehab consult  ?Follow Up Recommendations Acute inpatient rehab (3hours/day)  ?Assistance recommended at discharge Frequent or constant Supervision/Assistance  ?Patient can return home with the following A lot of help with walking and/or transfers;A lot of help with bathing/dressing/bathroom;Direct supervision/assist for medications management;Direct supervision/assist for financial management;Assist for transportation;Help with stairs or ramp for entrance  ?OT Equipment Other (comment) ?(3in1)  ?AM-PAC OT "6 Clicks" Daily Activity Outcome Measure (Version 2)  ?Help from another person eating meals? 3  ?Help from another person taking care of personal grooming? 3  ?Help from another person toileting, which includes using toliet, bedpan, or urinal? 2  ?Help from another person bathing (including washing, rinsing, drying)? 2  ?Help from another person to put on and taking off regular upper body clothing? 3  ?Help from another person to put on and taking off regular lower body clothing? 2  ?6 Click Score 15  ?Progressive Mobility  ?What is the highest level of mobility based on the progressive mobility assessment? Level 5 (Walks with assist in room/hall) - Balance while stepping forward/back and can walk in room with assist - Complete  ?Activity Transferred from bed to chair;Transferred from chair to bed  ?OT Goal Progression  ?Progress towards OT goals Progressing toward goals  ?Acute Rehab OT Goals  ?Patient Stated Goal to be independent agian  ?OT Goal Formulation With patient/family  ?Time For Goal Achievement 02/06/22  ?Potential to Achieve Goals Good  ?ADL Goals  ?Pt Will Perform Lower Body Bathing Independently;sitting/lateral leans;sit to/from  stand;with adaptive equipment  ?Pt Will Perform Lower Body Dressing Independently;with adaptive equipment;sitting/lateral leans;sit to/from stand  ?Pt Will Transfer to Toilet Independently;ambulating  ?Additional ADL Goal #1 Patient will demonstrate increased RUE fine motor coordination in order to complete ADL and IADL tasks.  ?Additional ADL Goal #2 Patient will demonstrate increased activity tolerance in order to complete functional task in standing for 3-5 minutes without need for seated rest break.  ?OT Time Calculation  ?OT Start Time (ACUTE ONLY) 1414  ?OT Stop Time (ACUTE ONLY) 1443  ?OT Time Calculation (min) 29 min  ?OT General Charges  ?$OT Visit 1 Visit  ?OT Treatments  ?$Self Care/Home Management  8-22 mins  ?$Neuromuscular Re-education 8-22 mins  ? [facility], OT/L  ? ?Acute OT Clinical Specialist ?Acute Rehabilitation Services ?Pager [phone] ?Office [phone]  ?

## 2022-01-25 NOTE — Care Management Important Message (Signed)
Important Message ? ?Patient Details  ?Name: Timothy Moran ?MRN: [id number] ?Date of Birth: [birth year] ? ? ?Medicare Important Message Given:  Yes ? ? ? ? ?Maston Wight ?01/25/2022, 2:49 PM ?

## 2022-01-25 NOTE — Discharge Summary (Signed)
Physician Discharge Summary  Patient ID: Timothy Moran MRN: [id number] DOB/AGE: 1946/01/26 76 y.o.  Admit date: 01/25/2022 Discharge date: 02/02/2022  Discharge Diagnoses:  Principal Problem:   Meningioma [facility]) Active Problems:   Right arm weakness   Anemia   Leukocytosis Active problems: Fall resulting in RUE weakness Left parafalcine meningioma Chronic systolic heart failure Cardiomyopathy CAD CKD 3b Hypertension DM-2 Chronic urinary retention Hyperlipidemia Insomnia Anemia Leukocytosis Right rotator cuff tear  Discharged Condition: good  Significant Diagnostic Studies: Narrative & Impression  CLINICAL DATA:  Shoulder pain. No prior imaging. Rotator cuff tear suspected.   EXAM: MRI OF THE RIGHT SHOULDER WITHOUT CONTRAST   TECHNIQUE: Multiplanar, multisequence MR imaging of the shoulder was performed. No intravenous contrast was administered.   COMPARISON:  None Available.   FINDINGS: Despite efforts by the technologist and patient, motion artifact is present on today's exam and could not be eliminated. This reduces exam sensitivity and specificity.   Rotator cuff: Massive full-thickness tear of the entire AP dimension of the supraspinatus tendon footprint with at least 2.7 cm tendon retraction medial to the humeral head apex. There is high-grade partial-thickness articular sided tearing of the anterior infraspinatus. In this region, there is an oval intermediate to decreased T2 signal possible calcific structure just deep to the beta posterior infraspinatus tendon (coronal series 9, image 14) measuring up to approximately 14 by 4 mm (long axis of the infraspinatus by short axis of the infraspinatus). Moderate superior subscapularis tendinosis with moderate articular sided partial-thickness tearing (axial series 3 images 11 through 15). There is a marrow fat intensity ossicle measuring up to 9 mm seen within retracted tendon fibers of the superior  subscapularis (axial image 14 and sagittal image 15). The teres minor is intact.   Muscles: Moderate supraspinatus and mild-to-moderate infraspinatus muscle atrophy. Mild scattered fatty infiltration of the infraspinatus.   Biceps long head: The superior subscapularis tendon tear allows the long head of the biceps tendon to be perched on the anterior superior aspect of the lesser tuberosity at the biceps tendon enters the bicipital groove. There is mild-to-moderate proximal long head of the biceps intermediate T2 signal and thickening tendinosis.   Acromioclavicular Joint: There are moderate to severe degenerative changes of the acromioclavicular joint including joint space narrowing, subchondral marrow edema, and peripheral osteophytosis. Mild downsloping of the anterolateral acromion (type III). Moderate lateral downsloping of the acromion on coronal images with mild-to-moderate distal lateral subacromial spurring.   Glenohumeral Joint: Full-thickness cartilage loss within the superior aspect of the humeral head. Moderate to high-grade thinning of the glenoid cartilage.   Labrum: Mild degenerative irregularity of the posterosuperior glenoid labrum.   Bones:  No acute fracture.   Other: None.   IMPRESSION: 1. Massive full-thickness tear of the entire AP dimension of the supraspinatus. High-grade partial-thickness tearing of the anterior infraspinatus. 2. Oval low signal structure deep to the anterior infraspinatus partially torn tendon fibers, a possible calcific loose body measuring up to 14 mm. 3. Moderate superior subscapularis partial-thickness tearing with tendon retraction and marrow fat intensity 9 mm loose body within the retracted tendon fibers. 4. Moderate supraspinatus and mild-to-moderate infraspinatus muscle atrophy. 5. The superior subscapularis tendon tear allows the long head of the biceps tendon to be perched on the anterior superior aspect of the lesser  tuberosity at the biceps tendon enters the bicipital groove. Moderate proximal biceps tendinosis. 6. Moderate to severe acromioclavicular osteoarthritis. 7. Moderate glenohumeral osteoarthritis.     Electronically Signed   By: Viann Fish.D.  On: 02/01/2022 14:55       Labs:  Basic Metabolic Panel:    Latest Ref Rng & Units 02/02/2022    5:59 AM 02/01/2022    5:25 AM 01/26/2022    6:08 AM  BMP  Glucose 70 - 99 mg/dL 142   149   123    BUN 8 - 23 mg/dL 51   56   61    Creatinine 0.61 - 1.24 mg/dL 1.84   1.86   2.01    Sodium 135 - 145 mmol/L 139   139   139    Potassium 3.5 - 5.1 mmol/L 4.9   4.9   4.8    Chloride 98 - 111 mmol/L 113   113   114    CO2 22 - 32 mmol/L '17   20   19    '$ Calcium 8.9 - 10.3 mg/dL 8.8   8.8   8.9          Latest Ref Rng & Units 02/02/2022    5:59 AM 02/01/2022    5:25 AM 01/26/2022    6:08 AM  CBC  WBC 4.0 - 10.5 K/uL 14.0   12.7   10.9    Hemoglobin 13.0 - 17.0 g/dL 10.2   9.7   9.5    Hematocrit 39.0 - 52.0 % 30.9   30.2   28.9    Platelets 150 - 400 K/uL 208   206   243       CBG (last 3)  Recent Labs    02/01/22 1605  GLUCAP 232*    Brief HPI:   Timothy Moran is a 76 y.o. male who fell backward striking his head and right neck shoulder area on 01/21/2022. He complained of right hemiparesis. CT head revealed left parafalcine meningioma.Started on Decadron with improvement in symptoms.   Hospital Course: Timothy Moran was admitted to rehab 01/25/2022 for inpatient therapies to consist of PT, ST and OT at least three hours five days a week. Past admission physiatrist, therapy team and rehab RN have worked together to provide customized collaborative inpatient rehab.  Found to have right-sided shoulder weakness and exam suspicious for rotator cuff injury.  MRI obtained.  Significant for RCT. Follow-up blood work revealed improvement in serum creatinine and stable hemoglobin. Mild leukocytosis persists. UA and culture obtained on  5/24. He has remained afebrile. He was prescribed Cipro for seven days at discharge.  Blood pressures were monitored on TID basis and Toprol-XL 50 mg daily continued.  This was decreased to 25 mg daily on 5/18 secondary to bradycardia. Isosorbide added. He did not require home hydralazine.  Rehab course: During patient's stay in rehab weekly team conferences were held to monitor patient's progress, set goals and discuss barriers to discharge. At admission, patient required min assist with basic self-care skills, min assist with bed mobility mobility, transfers and locomotion. Instructed patient and wife to call nuerosurgeon, Dr. Dema Severin, to arrange follow-up and length of therapy of dexamethasone.  He has had improvement in activity tolerance, balance, postural control as well as ability to compensate for deficits. He has had improvement in functional use RUE/LUE  and RLE/LLE as well as improvement in awareness.  Related to 100 feet with rolling walker and minimal assistance x2 on 5/19.   Disposition: Home Discharge disposition: 01-Home or Self Care      Diet: Heart healthy  Special Instructions:  No driving, alcohol consumption or tobacco use.   30-35 minutes were spent on discharge  planning and discharge summary.   Discharge Instructions     Ambulatory referral to Occupational Therapy   Complete by: As directed    Eval and treat   Ambulatory referral to Physical Therapy   Complete by: As directed    Eval and treat   Discharge patient   Complete by: As directed    Discharge disposition: 01-Home or Self Care   Discharge patient date: 02/02/2022      Allergies as of 02/02/2022   No Known Allergies      Medication List     STOP taking these medications    ACID CONTROL PO   hydrALAZINE 10 MG tablet Commonly known as: APRESOLINE       TAKE these medications    acetaminophen 325 MG tablet Commonly known as: TYLENOL Take 1-2 tablets (325-650 mg total) by mouth  every 4 (four) hours as needed for mild pain.   aspirin EC 81 MG tablet Take 81 mg by mouth daily. Swallow whole.   atorvastatin 80 MG tablet Commonly known as: LIPITOR Take 1 tablet (80 mg total) by mouth daily.   ciprofloxacin 500 MG tablet Commonly known as: CIPRO Take 1 tablet (500 mg total) by mouth 2 (two) times daily for 14 doses.   dexamethasone 2 MG tablet Commonly known as: DECADRON Take 1 tablet (2 mg total) by mouth every 6 (six) hours for 28 days.   fluticasone 50 MCG/ACT nasal spray Commonly known as: FLONASE Place 1-2 sprays into both nostrils at bedtime.   isosorbide dinitrate 30 MG tablet Commonly known as: ISORDIL Take 1 tablet (30 mg total) by mouth 2 (two) times daily.   melatonin 5 MG Tabs Take 1 tablet (5 mg total) by mouth at bedtime.   metoprolol succinate 50 MG 24 hr tablet Commonly known as: TOPROL-XL TAKE 1 TABLET DAILY, WITH  OR IMMEDIATELY FOLLOWING A MEAL What changed:  how much to take how to take this when to take this additional instructions   multivitamin with minerals Tabs tablet Take 1 tablet by mouth daily. What changed: additional instructions   senna-docusate 8.6-50 MG tablet Commonly known as: Senokot-S Take 1 tablet by mouth at bedtime as needed for mild constipation.   SUPER B COMPLEX PO Take 1 tablet by mouth daily. Unknown strength        Follow-up Information     Cari Caraway, MD. Call.   Specialty: Family Medicine Why: Call tomorrow to make arrangements for hospital follow-up appointment Contact information: [place] [address] [phone]         Park Liter, MD .   Specialty: Cardiology Contact information: [place] [address] [phone]         Constance Haw, MD .   Specialty: Cardiology Contact information: [address] [place] [address] [phone]         [name], MD Follow up.   Specialty: Physical  Medicine and Rehabilitation Why: As needed Contact information: [address] [place] [address] [phone]         Justice Britain, MD Follow up.   Specialty: Orthopedic Surgery Why: Call for appointment regarding right shoulder MRI Contact information: [address] [place] [address] [phone]         Wynonia Musty, MD. Call.   Specialty: Neurosurgery Why: Call to make arrangments for hospital follow-up appointment Contact information: [place] [address] [phone]  Lucas Mallow, MD. Go to.   Specialty: Urology Why: Call to reschedule follow-up appointment Contact information: [place] [address] [phone]                 Signed: Barbie Banner 02/02/2022, 10:30 AM

## 2022-01-25 NOTE — Progress Notes (Signed)
Pt arrived via bed from 5N22 with wife at bedside. Pt complains of pain in his right arm and ride side of neck. Pt has foley that has been insterted for 18 months. Per pt wife, foley was to be removed permanently 04/02/22 and will let nursing know the exact date the foley needs to be replaced at a later date. Pt and family oriented to unit policy and procedure, both expressed understanding. ?

## 2022-01-25 NOTE — Progress Notes (Signed)
Physical Therapy Treatment ?Patient Details ?Name: Timothy Moran ?MRN: [id number] ?DOB: 1946-02-25 ?Today's Date: 01/25/2022 ? ? ?History of Present Illness Patient is a 76 yo male presenting to the ED status post fall onto R shoulder R leg and head on 01/21/22. Patient could not move R leg or shoulder post fall. MRI C-spine shows no injury to the cord or spine, no ligamentous injury,  PMH incudes: left parafalcine brain meningioma with vasogenic edema (being followed at [facility]), CKD 3, history of cardiac arrest in January 2022 due to STEMI, recent biventricular pacemaker placed February 2023, chronic urinary retention with chronic Foley, history of remote PE no longer anticoagulation ? ?  ?PT Comments  ? ? Pt making excellent progress towards his physical therapy goals; remains very motivated to participate. Session focused on transfer/gait training and functional exercises for coordination and strengthening. Pt requiring min assist for transfers and ambulating 150 ft with a walker and up to moderate assist. Improved ataxic gait pattern and RLE coordination this session, although still had a couple episodes of right knee buckle. Suspect good progress considering PLOF, motivation and family support. D/c plan remains appropriate.  ?   ?Recommendations for follow up therapy are one component of a multi-disciplinary discharge planning process, led by the attending physician.  Recommendations may be updated based on patient status, additional functional criteria and insurance authorization. ? ?Follow Up Recommendations ? Acute inpatient rehab (3hours/day) ?  ?  ?Assistance Recommended at Discharge Frequent or constant Supervision/Assistance  ?Patient can return home with the following Assistance with cooking/housework;Direct supervision/assist for medications management;Direct supervision/assist for financial management;Assist for transportation;Help with stairs or ramp for entrance;A lot of help with walking  and/or transfers;A lot of help with bathing/dressing/bathroom ?  ?Equipment Recommendations ? None recommended by PT  ?  ?Recommendations for Other Services   ? ? ?  ?Precautions / Restrictions Precautions ?Precautions: Fall ?Restrictions ?Weight Bearing Restrictions: No  ?  ? ?Mobility ? Bed Mobility ?Overal bed mobility: Needs Assistance ?Bed Mobility: Supine to Sit ?  ?  ?Supine to sit: Min assist ?  ?  ?General bed mobility comments: Pt wife providing minA to pull trunk up to sitting position ?  ? ?Transfers ?Overall transfer level: Needs assistance ?Equipment used: Rolling walker (2 wheels) ?Transfers: Sit to/from Stand ?Sit to Stand: Min assist, +2 safety/equipment ?  ?  ?  ?  ?  ?General transfer comment: MinA to rise, pushing up with both hands ?  ? ?Ambulation/Gait ?Ambulation/Gait assistance: Mod assist, +2 safety/equipment ?Gait Distance (Feet): 150 Feet ?Assistive device: Rolling walker (2 wheels) ?Gait Pattern/deviations: Step-through pattern, Decreased stride length, Knee flexed in stance - left, Knees buckling, Scissoring, Narrow base of support, Staggering right, Ataxic ?Gait velocity: decreased ?  ?  ?General Gait Details: Pt with improved ataxic gait pattern, 1-2 episodes of R knee buckle, cues for walker proximity and smaller steps for increased control. Up to [place] provided for balance, +2 safety. ? ? ?Stairs ?  ?  ?  ?  ?  ? ? ?Wheelchair Mobility ?  ? ?Modified Rankin (Stroke Patients Only) ?  ? ? ?  ?Balance Overall balance assessment: Needs assistance ?Sitting-balance support: Feet supported ?Sitting balance-Leahy Scale: Fair ?  ?  ?Standing balance support: Bilateral upper extremity supported ?Standing balance-Leahy Scale: Poor ?Standing balance comment: reliant on RW ?  ?  ?  ?  ?  ?  ?  ?  ?  ?  ?  ?  ? ?  ?Cognition Arousal/Alertness:  Awake/alert ?Behavior During Therapy: [facility] for tasks assessed/performed ?Overall Cognitive Status: Impaired/Different from baseline ?Area of Impairment:  Safety/judgement, Problem solving, Memory ?  ?  ?  ?  ?  ?  ?  ?  ?  ?  ?Memory: Decreased short-term memory ?  ?Safety/Judgement: Decreased awareness of safety, Decreased awareness of deficits ?  ?Problem Solving: Difficulty sequencing, Requires verbal cues ?  ?  ?  ? ?  ?Exercises General Exercises - Lower Extremity ?Mini-Sqauts: Both, 10 reps, Standing ?Other Exercises ?Other Exercises: x5 serial sit to stands ?Other Exercises: Standing: targeted R stepping x 10 ?Other Exercises: Standing: Weight shifting to R/L  x10 ? ?  ?General Comments   ?  ?  ? ?Pertinent Vitals/Pain Pain Assessment ?Pain Assessment: Faces ?Faces Pain Scale: No hurt  ? ? ?Home Living   ?  ?  ?  ?  ?  ?  ?  ?  ?  ?   ?  ?Prior Function    ?  ?  ?   ? ?PT Goals (current goals can now be found in the care plan section) Acute Rehab PT Goals ?Patient Stated Goal: return to independence ?Potential to Achieve Goals: Good ?Progress towards PT goals: Progressing toward goals ? ?  ?Frequency ? ? ? Min 4X/week ? ? ? ?  ?PT Plan Current plan remains appropriate  ? ? ?Co-evaluation   ?  ?  ?  ?  ? ?  ?AM-PAC PT "6 Clicks" Mobility   ?Outcome Measure ? Help needed turning from your back to your side while in a flat bed without using bedrails?: A Little ?Help needed moving from lying on your back to sitting on the side of a flat bed without using bedrails?: A Little ?Help needed moving to and from a bed to a chair (including a wheelchair)?: A Little ?Help needed standing up from a chair using your arms (e.g., wheelchair or bedside chair)?: A Little ?Help needed to walk in hospital room?: A Lot ?Help needed climbing 3-5 steps with a railing? : A Lot ?6 Click Score: 16 ? ?  ?End of Session Equipment Utilized During Treatment: Gait belt ?Activity Tolerance: Patient tolerated treatment well ?Patient left: in chair;with call bell/phone within reach;with family/visitor present ?Nurse Communication: Mobility status ?PT Visit Diagnosis: Other abnormalities of gait  and mobility (R26.89);Muscle weakness (generalized) (M62.81);Ataxic gait (R26.0) ?  ? ? ?Time: 0100-7121 ?PT Time Calculation (min) (ACUTE ONLY): 24 min ? ?Charges:  $Gait Training: 8-22 mins ?$Therapeutic Activity: 8-22 mins          ?          ? ?Wyona Almas, PT, DPT ?Acute Rehabilitation Services ?Pager [phone] ?Office [phone] ? ? ? ?Carloine Margo Aye ?01/25/2022, 10:47 AM ? ?

## 2022-01-25 NOTE — H&P (Addendum)
?  ?Physical Medicine and Rehabilitation Admission H&P ?  ?CC: Deficits secondary to left parafalcine meningioma with vasogenic edema ?  ?HPI: Timothy Moran is a 76 year old male who was leaving a friend's home on the evening of 01/21/2022 when he missed stepped fell backward and struck the back of his head.  He complained of right upper and right lower extremity paralysis.  He was brought to the emergency department where CT of the head revealed left parafalcine meningioma with associated vasogenic edema in the left posterior frontal and parietal lobes.  Neurology and neurosurgery were consulted.  He reported he did not lose consciousness.  On examination his right upper extremity weakness was minimally improved and he could lift his right upper extremity off the bed but remained weak and clumsy.  He also had numbness in both right upper and right lower extremities.  He was started on Decadron and MRI of the brain and C-spine were performed.  His past medical history is significant for known meningioma status post gamma knife radiation to his brain lesion and reports he was to follow-up at [facility] next week.  He has remained alert and oriented.  His cervical spine MRI demonstrates mild bulging without significant neural compression.  Neurology has concluded his right-sided weakness favored to be multifactorial possibly secondary to new area of edema of the left frontoparietal white matter on MRI and adjacent meningioma, possible brachial plexus injury secondary to his fall.  Possibility of cervical cord contusion secondary to fall.  He remains on steroids and both neurology and neurosurgery have signed off.  Cardiology was consulted due to possible atrial fibrillation, however EKG and telemetry both showed sinus rhythm with PACs. The patient requires inpatient medicine and rehabilitation evaluations and services for ongoing dysfunction secondary to left parafalcine meningioma with vasogenic  edema. ?  ?The patient's past medical history is significant for coronary artery disease status post stent placement, congestive heart failure, chronic kidney disease with indwelling Foley secondary to urinary retention. ?  ?  ?Review of Systems  ?Constitutional:  Negative for chills and fever.  ?HENT:  Negative for hearing loss and tinnitus.   ?Eyes:  Negative for blurred vision.  ?Respiratory:  Negative for cough.   ?Cardiovascular:  Negative for chest pain and palpitations.  ?Gastrointestinal:  Negative for nausea and vomiting.  ?Genitourinary:   ?     Chronic foley/retention  ?Musculoskeletal:  Positive for falls, joint pain, myalgias and neck pain.  ?Skin:  Negative for rash.  ?Neurological:  Positive for focal weakness. Negative for dizziness, tingling, sensory change, speech change, seizures, loss of consciousness and headaches.  ?Psychiatric/Behavioral:  Negative for depression, substance abuse and suicidal ideas.   ?  ?    ?Past Medical History:  ?Diagnosis Date  ? Acute blood loss anemia 10/05/2020  ? Acute respiratory failure ([place]), hypothermia therapy, vent - extubated 10/06/20    ? AKI (acute kidney injury) ([place]) 10/05/2020  ? Anoxic brain injury ([place]) 10/26/2020  ? Arrhythmia    ? Ascending aortic aneurysm ([place]) 01/29/2018  ?  a. 2019 4.3 cm by echo; b. 02/2021 Echo: Ao root 26m.  ? Benign brain tumor (HChelsea    ? Benign neoplasm of brain (HWalnut 12/19/2017  ? Cardiac arrest with ventricular fibrillation (HNoma    ? Chest pain 12/04/2017  ? Chronic kidney disease, stage III (moderate) (HWest Des [place] 12/19/2017  ? CKD (chronic kidney disease), stage III - IV (HClif[place]    ? Coronary Artery Disease    ?  a. 02/7123 Inf STEMI complicated by cardiac arrest/PCI: LAD 50p/m, LCX 100d (2.5 x 30 Resolute Onyx DES); b. 04/2021 PCI: 04/2021 LM nl, LAD 50p/5m(2.5x30 Onyx Frontier DES), LCX 25p, 541mRFR 0.98), 40d ISR (RFR 0.98), OM2 25, RCA mild diff dzs.  ? Diabetes mellitus, type 2 (HCGlen Hope01/25/2022  ?  10/06/20 A1C 7%  ? Dizziness  10/11/2019  ? Encounter for imaging study to confirm orogastric (OG) tube placement    ? Hematuria 10/26/2020  ? HFrEF (heart failure with reduced ejection fraction) (HCLaurel   ?  a. 11/2020 Echo: EF 25-30%; b. 02/2021 Echo: EF 25-30%, glob HK. Mild LVH. Nl RV fxn. Triv AI. Ao root 4062m ? History of pulmonary embolus (PE)    ? HLD (hyperlipidemia) 10/26/2020  ? Hypertension    ? Iron deficiency anemia rec'd IV iron 10/26/2020  ? Ischemic cardiomyopathy    ?  a. 11/2020 Echo: EF 25-30%; b. 02/2021 Echo: EF 25-30%, glob HK.  ? Left bundle branch block 12/19/2017  ? Meningioma (HCCRufus2/11/2019  ? Nonsustained ventricular tachycardia (HCCHurst1/29/2021  ? Pain of left hip joint 05/29/2020  ? Personal history of pulmonary embolism 12/19/2017  ? Pneumonia of both lungs due to infectious organism    ? Pulmonary hypertension due to thromboembolism (HCCMyrtletown6/28/2019  ?  See echo 12/27/17 with nl PAS vs CTa chest 02/23/18   ? S/P angioplasty with stent 10/04/20 DES to LCX  10/26/2020  ? Sinus bradycardia 12/22/2017  ? SOB (shortness of breath)    ? Solitary pulmonary nodule on lung CT 03/08/2018  ?  CT 12/04/17 1.0 x 0.8 x 0.7 cm nodular opacity in the RUL vs not seen 03/16/10 posterior segment of the right upper lobe. .SpMarland Kitchenrometry 03/08/2018    FEV1 3.86 (108%)  Ratio 96 s prior rx  - PET  03/13/18   Low grade c/w adenoca > rec T surgery eval    ? STEMI (ST elevation myocardial infarction) (HCCPawnee Rock1/23/2022  ? Urinary retention 10/26/2020  ? Vestibular schwannoma (HCCThree Oaks2/11/2019  ?  ?     ?Past Surgical History:  ?Procedure Laterality Date  ? APPENDECTOMY      ? BIV ICD INSERTION CRT-D N/A 11/03/2021  ?  Procedure: BIV ICD INSERTION CRT-D;  Surgeon: CamConstance HawD;  [facility];  Service: Cardiovascular;  Laterality: N/A;  ? CARDIAC CATHETERIZATION      ? CORONARY ANGIOPLASTY WITH STENT PLACEMENT Left 04/12/2021  ?  LAD stent placement  ? CORONARY STENT INTERVENTION N/A 04/12/2021  ?  Procedure: CORONARY STENT  INTERVENTION;  Surgeon: EndNelva BushD;  Location: [facility];  Service: Cardiovascular;  Laterality: N/A;  ? CORONARY/GRAFT ACUTE MI REVASCULARIZATION N/A 10/04/2020  ?  Procedure: Coronary/Graft Acute MI Revascularization;  Surgeon: EndNelva BushD;  Location: [facility];  Service: Cardiovascular;  Laterality: N/A;  ? HERNIA REPAIR      ? INTRAVASCULAR PRESSURE WIRE/FFR STUDY N/A 04/12/2021  ?  Procedure: INTRAVASCULAR PRESSURE WIRE/FFR STUDY;  Surgeon: EndNelva BushD;  Location: [facility];  Service: Cardiovascular;  Laterality: N/A;  ? INTRAVASCULAR ULTRASOUND/IVUS N/A 04/12/2021  ?  Procedure: Intravascular Ultrasound/IVUS;  Surgeon: EndNelva BushD;  Location: [facility];  Service: Cardiovascular;  Laterality: N/A;  ? LEFT HEART CATH AND CORONARY ANGIOGRAPHY N/A 10/04/2020  ?  Procedure: LEFT HEART CATH AND CORONARY ANGIOGRAPHY;  Surgeon: EndNelva BushD;  Location: [facility];  Service: Cardiovascular;  Laterality: N/A;  ? RIGHT/LEFT HEART CATH AND CORONARY ANGIOGRAPHY N/A 04/12/2021  ?  Procedure: RIGHT/LEFT HEART CATH AND CORONARY ANGIOGRAPHY;  Surgeon: Nelva Bush, MD;  Location: [facility];  Service: Cardiovascular;  Laterality: N/A;  ?  ?     ?Family History  ?Problem Relation Age of Onset  ? Diabetes Mother    ? Brain cancer Sister    ? Melanoma Brother    ?      Radiation Exposure  ? Leukemia Brother    ?      Agent Orange  ?  ?Social History:  reports that he quit smoking about 53 years ago. His smoking use included cigarettes. He has a 2.50 pack-year smoking history. He has never used smokeless tobacco. He reports current alcohol use. He reports that he does not use drugs. ?Allergies: No Known Allergies ?      ?Medications Prior to Admission  ?Medication Sig Dispense Refill  ? aspirin EC 81 MG tablet Take 81 mg by mouth daily. Swallow whole.      ? atorvastatin (LIPITOR) 80 MG tablet Take 1 tablet (80 mg total) by mouth daily.  90 tablet 3  ? B Complex-C (SUPER B COMPLEX PO) Take 1 tablet by mouth daily. Unknown strength      ? hydrALAZINE (APRESOLINE) 10 MG tablet Take 1 tablet (10 mg total) by mouth 3 (three) times daily. 180 tablet 3

## 2022-01-25 NOTE — H&P (Signed)
? ? ?Physical Medicine and Rehabilitation Admission H&P ? ?CC: Deficits secondary to left parafalcine meningioma with vasogenic edema ? ?HPI: Timothy Moran is a 76 year old male who was leaving a friend's home on the evening of 01/21/2022 when he missed stepped fell backward and struck the back of his head.  He complained of right upper and right lower extremity paralysis.  He was brought to the emergency department where CT of the head revealed left parafalcine meningioma with associated vasogenic edema in the left posterior frontal and parietal lobes.  Neurology and neurosurgery were consulted.  He reported he did not lose consciousness.  On examination his right upper extremity weakness was minimally improved and he could lift his right upper extremity off the bed but remained weak and clumsy.  He also had numbness in both right upper and right lower extremities.  He was started on Decadron and MRI of the brain and C-spine were performed.  His past medical history is significant for known meningioma status post gamma knife radiation to his brain lesion and reports he was to follow-up at [facility] next week.  He has remained alert and oriented.  His cervical spine MRI demonstrates mild bulging without significant neural compression.  Neurology has concluded his right-sided weakness favored to be multifactorial possibly secondary to new area of edema of the left frontoparietal white matter on MRI and adjacent meningioma, possible brachial plexus injury secondary to his fall.  Possibility of cervical cord contusion secondary to fall.  He remains on steroids and both neurology and neurosurgery have signed off.  Cardiology was consulted due to possible atrial fibrillation, however EKG and telemetry both showed sinus rhythm with PACs. The patient requires inpatient medicine and rehabilitation evaluations and services for ongoing dysfunction secondary to left parafalcine meningioma with vasogenic  edema. ? ?The patient's past medical history is significant for coronary artery disease status post stent placement, congestive heart failure, chronic kidney disease with indwelling Foley secondary to urinary retention. ? ? ?Review of Systems  ?Constitutional:  Negative for chills and fever.  ?HENT:  Negative for hearing loss and tinnitus.   ?Eyes:  Negative for blurred vision.  ?Respiratory:  Negative for cough.   ?Cardiovascular:  Negative for chest pain and palpitations.  ?Gastrointestinal:  Negative for nausea and vomiting.  ?Genitourinary:   ?     Chronic foley/retention  ?Musculoskeletal:  Positive for falls, joint pain, myalgias and neck pain.  ?Skin:  Negative for rash.  ?Neurological:  Positive for focal weakness. Negative for dizziness, tingling, sensory change, speech change, seizures, loss of consciousness and headaches.  ?Psychiatric/Behavioral:  Negative for depression, substance abuse and suicidal ideas.   ? ?Past Medical History:  ?Diagnosis Date  ? Acute blood loss anemia 10/05/2020  ? Acute respiratory failure ([place]), hypothermia therapy, vent - extubated 10/06/20   ? AKI (acute kidney injury) ([place]) 10/05/2020  ? Anoxic brain injury ([place]) 10/26/2020  ? Arrhythmia   ? Ascending aortic aneurysm ([place]) 01/29/2018  ? a. 2019 4.3 cm by echo; b. 02/2021 Echo: Ao root 54m.  ? Benign brain tumor (HNaperville   ? Benign neoplasm of brain (HProvidence Village 12/19/2017  ? Cardiac arrest with ventricular fibrillation (HCement City   ? Chest pain 12/04/2017  ? Chronic kidney disease, stage III (moderate) (HRound Hill Village 12/19/2017  ? CKD (chronic kidney disease), stage III - IV (HBlack Eagle   ? Coronary Artery Disease   ? a. 10/7371Inf STEMI complicated by cardiac arrest/PCI: LAD 50p/m, LCX 100d (2.5 x 30 Resolute Onyx DES);  b. 04/2021 PCI: 04/2021 LM nl, LAD 50p/26m(2.5x30 Onyx Frontier DES), LCX 25p, 513mRFR 0.98), 40d ISR (RFR 0.98), OM2 25, RCA mild diff dzs.  ? Diabetes mellitus, type 2 (HCKenosha01/25/2022  ? 10/06/20 A1C 7%  ? Dizziness 10/11/2019  ?  Encounter for imaging study to confirm orogastric (OG) tube placement   ? Hematuria 10/26/2020  ? HFrEF (heart failure with reduced ejection fraction) (HCBarnhill  ? a. 11/2020 Echo: EF 25-30%; b. 02/2021 Echo: EF 25-30%, glob HK. Mild LVH. Nl RV fxn. Triv AI. Ao root 4057m ? History of pulmonary embolus (PE)   ? HLD (hyperlipidemia) 10/26/2020  ? Hypertension   ? Iron deficiency anemia rec'd IV iron 10/26/2020  ? Ischemic cardiomyopathy   ? a. 11/2020 Echo: EF 25-30%; b. 02/2021 Echo: EF 25-30%, glob HK.  ? Left bundle branch block 12/19/2017  ? Meningioma (HCCWoodruff2/11/2019  ? Nonsustained ventricular tachycardia (HCCLouisville1/29/2021  ? Pain of left hip joint 05/29/2020  ? Personal history of pulmonary embolism 12/19/2017  ? Pneumonia of both lungs due to infectious organism   ? Pulmonary hypertension due to thromboembolism (HCCMillersville6/28/2019  ? See echo 12/27/17 with nl PAS vs CTa chest 02/23/18   ? S/P angioplasty with stent 10/04/20 DES to LCX  10/26/2020  ? Sinus bradycardia 12/22/2017  ? SOB (shortness of breath)   ? Solitary pulmonary nodule on lung CT 03/08/2018  ? CT 12/04/17 1.0 x 0.8 x 0.7 cm nodular opacity in the RUL vs not seen 03/16/10 posterior segment of the right upper lobe. .SpMarland Kitchenrometry 03/08/2018    FEV1 3.86 (108%)  Ratio 96 s prior rx  - PET  03/13/18   Low grade c/w adenoca > rec T surgery eval    ? STEMI (ST elevation myocardial infarction) (HCCAlpine1/23/2022  ? Urinary retention 10/26/2020  ? Vestibular schwannoma (HCCVander2/11/2019  ? ?Past Surgical History:  ?Procedure Laterality Date  ? APPENDECTOMY    ? BIV ICD INSERTION CRT-D N/A 11/03/2021  ? Procedure: BIV ICD INSERTION CRT-D;  Surgeon: CamConstance HawD;  [facility];  Service: Cardiovascular;  Laterality: N/A;  ? CARDIAC CATHETERIZATION    ? CORONARY ANGIOPLASTY WITH STENT PLACEMENT Left 04/12/2021  ? LAD stent placement  ? CORONARY STENT INTERVENTION N/A 04/12/2021  ? Procedure: CORONARY STENT INTERVENTION;  Surgeon: EndNelva BushD;   [facility];  Service: Cardiovascular;  Laterality: N/A;  ? CORONARY/GRAFT ACUTE MI REVASCULARIZATION N/A 10/04/2020  ? Procedure: Coronary/Graft Acute MI Revascularization;  Surgeon: EndNelva BushD;  [facility];  Service: Cardiovascular;  Laterality: N/A;  ? HERNIA REPAIR    ? INTRAVASCULAR PRESSURE WIRE/FFR STUDY N/A 04/12/2021  ? Procedure: INTRAVASCULAR PRESSURE WIRE/FFR STUDY;  Surgeon: EndNelva BushD;  Location: [facility];  Service: Cardiovascular;  Laterality: N/A;  ? INTRAVASCULAR ULTRASOUND/IVUS N/A 04/12/2021  ? Procedure: Intravascular Ultrasound/IVUS;  Surgeon: EndNelva BushD;  Location: [facility];  Service: Cardiovascular;  Laterality: N/A;  ? LEFT HEART CATH AND CORONARY ANGIOGRAPHY N/A 10/04/2020  ? Procedure: LEFT HEART CATH AND CORONARY ANGIOGRAPHY;  Surgeon: EndNelva BushD;  Location: [facility];  Service: Cardiovascular;  Laterality: N/A;  ? RIGHT/LEFT HEART CATH AND CORONARY ANGIOGRAPHY N/A 04/12/2021  ? Procedure: RIGHT/LEFT HEART CATH AND CORONARY ANGIOGRAPHY;  Surgeon: EndNelva BushD;  Location: [facility];  Service: Cardiovascular;  Laterality: N/A;  ? ?Family History  ?Problem Relation Age of Onset  ?  Diabetes Mother   ? Brain cancer Sister   ? Melanoma Brother   ?     Radiation Exposure  ? Leukemia Brother   ?     Agent Orange  ? ?Social History:  reports that he quit smoking about 53 years ago. His smoking use included cigarettes. He has a 2.50 pack-year smoking history. He has never used smokeless tobacco. He reports current alcohol use. He reports that he does not use drugs. ?Allergies: No Known Allergies ?Medications Prior to Admission  ?Medication Sig Dispense Refill  ? aspirin EC 81 MG tablet Take 81 mg by mouth daily. Swallow whole.    ? atorvastatin (LIPITOR) 80 MG tablet Take 1 tablet (80 mg total) by mouth daily. 90 tablet 3  ? B Complex-C (SUPER B COMPLEX PO) Take 1 tablet by mouth daily.  Unknown strength    ? hydrALAZINE (APRESOLINE) 10 MG tablet Take 1 tablet (10 mg total) by mouth 3 (three) times daily. 180 tablet 3  ? isosorbide dinitrate (ISORDIL) 30 MG tablet Take 1 tablet (30 mg total) by

## 2022-01-26 DIAGNOSIS — N1832 Chronic kidney disease, stage 3b: Secondary | ICD-10-CM | POA: Diagnosis not present

## 2022-01-26 DIAGNOSIS — D329 Benign neoplasm of meninges, unspecified: Secondary | ICD-10-CM | POA: Diagnosis not present

## 2022-01-26 DIAGNOSIS — R001 Bradycardia, unspecified: Secondary | ICD-10-CM

## 2022-01-26 DIAGNOSIS — D649 Anemia, unspecified: Secondary | ICD-10-CM

## 2022-01-26 DIAGNOSIS — W19XXXD Unspecified fall, subsequent encounter: Secondary | ICD-10-CM | POA: Diagnosis not present

## 2022-01-26 DIAGNOSIS — R29898 Other symptoms and signs involving the musculoskeletal system: Secondary | ICD-10-CM | POA: Diagnosis not present

## 2022-01-26 DIAGNOSIS — D72829 Elevated white blood cell count, unspecified: Secondary | ICD-10-CM

## 2022-01-26 LAB — COMPREHENSIVE METABOLIC PANEL
ALT: 22 U/L (ref 0–44)
AST: 18 U/L (ref 15–41)
Albumin: 3.4 g/dL — ABNORMAL LOW (ref 3.5–5.0)
Alkaline Phosphatase: 49 U/L (ref 38–126)
Anion gap: 6 (ref 5–15)
BUN: 61 mg/dL — ABNORMAL HIGH (ref 8–23)
CO2: 19 mmol/L — ABNORMAL LOW (ref 22–32)
Calcium: 8.9 mg/dL (ref 8.9–10.3)
Chloride: 114 mmol/L — ABNORMAL HIGH (ref 98–111)
Creatinine, Ser: 2.01 mg/dL — ABNORMAL HIGH (ref 0.61–1.24)
GFR, Estimated: 33 mL/min — ABNORMAL LOW (ref >60)
Glucose, Bld: 123 mg/dL — ABNORMAL HIGH (ref 70–99)
Potassium: 4.8 mmol/L (ref 3.5–5.1)
Sodium: 139 mmol/L (ref 135–145)
Total Bilirubin: 0.9 mg/dL (ref 0.3–1.2)
Total Protein: 6.6 g/dL (ref 6.5–8.1)

## 2022-01-26 LAB — CBC WITH DIFFERENTIAL/PLATELET
Abs Immature Granulocytes: 0.07 10*3/uL (ref 0.00–0.07)
Basophils Absolute: 0 10*3/uL (ref 0.0–0.1)
Basophils Relative: 0 %
Eosinophils Absolute: 0 10*3/uL (ref 0.0–0.5)
Eosinophils Relative: 0 %
HCT: 28.9 % — ABNORMAL LOW (ref 39.0–52.0)
Hemoglobin: 9.5 g/dL — ABNORMAL LOW (ref 13.0–17.0)
Immature Granulocytes: 1 %
Lymphocytes Relative: 16 %
Lymphs Abs: 1.7 10*3/uL (ref 0.7–4.0)
MCH: 30.4 pg (ref 26.0–34.0)
MCHC: 32.9 g/dL (ref 30.0–36.0)
MCV: 92.6 fL (ref 80.0–100.0)
Monocytes Absolute: 0.7 10*3/uL (ref 0.1–1.0)
Monocytes Relative: 6 %
Neutro Abs: 8.5 10*3/uL — ABNORMAL HIGH (ref 1.7–7.7)
Neutrophils Relative %: 77 %
Platelets: 243 10*3/uL (ref 150–400)
RBC: 3.12 MIL/uL — ABNORMAL LOW (ref 4.22–5.81)
RDW: 13.4 % (ref 11.5–15.5)
WBC: 10.9 10*3/uL — ABNORMAL HIGH (ref 4.0–10.5)
nRBC: 0 % (ref 0.0–0.2)

## 2022-01-26 MED ORDER — CHLORHEXIDINE GLUCONATE CLOTH 2 % EX PADS
6.0000 | MEDICATED_PAD | Freq: Every day | CUTANEOUS | Status: DC
  Administered 2022-01-26 – 2022-02-01 (×7): 6 via TOPICAL

## 2022-01-26 NOTE — Care Management (Signed)
Inpatient Rehabilitation Center ?Individual Statement of Services ? ?Patient Name:  Timothy Moran  ?Date:  01/26/2022 ? ?Welcome to the [facility].  Our goal is to provide you with an individualized program based on your diagnosis and situation, designed to meet your specific needs.  With this comprehensive rehabilitation program, you will be expected to participate in at least 3 hours of rehabilitation therapies Monday-Friday, with modified therapy programming on the weekends. ? ?Your rehabilitation program will include the following services:  Physical Therapy (PT), Occupational Therapy (OT), Speech Therapy (ST), 24 hour per day rehabilitation nursing, Therapeutic Recreaction (TR), Psychology, Neuropsychology, Care Coordinator, Rehabilitation Medicine, Nutrition Services, Pharmacy Services, and Other ? ?Weekly team conferences will be held on Tuesdays to discuss your progress.  Your Inpatient Rehabilitation Care Coordinator will talk with you frequently to get your input and to update you on team discussions.  Team conferences with you and your family in attendance may also be held. ? ?Expected length of stay: 10-12 days   ? ?Overall anticipated outcome: Supervision ? ?Depending on your progress and recovery, your program may change. Your Inpatient Rehabilitation Care Coordinator will coordinate services and will keep you informed of any changes. Your Inpatient Rehabilitation Care Coordinator's name and contact numbers are listed  below. ? ?The following services may also be recommended but are not provided by the [facility]:  ?Driving Evaluations ?Home Health Rehabiltiation Services ?Outpatient Rehabilitation Services ?Vocational Rehabilitation ?  ?Arrangements will be made to provide these services after discharge if needed.  Arrangements include referral to agencies that provide these services. ? ?Your insurance has been verified to be:  Medicare A/B ? ?Your primary  doctor is:  Cari Caraway ? ?Pertinent information will be shared with your doctor and your insurance company. ? ?Inpatient Rehabilitation Care Coordinator:  Cathleen Corti [phone] or (C) [phone] ? ?Information discussed with and copy given to patient by: Rana Snare, 01/26/2022, 2:45 PM    ?

## 2022-01-26 NOTE — Progress Notes (Addendum)
Physical Therapy Session Note ? ?Patient Details  ?Name: Timothy Moran ?MRN: [id number] ?Date of Birth: 03-10-1946 ? ?Today's Date: 01/26/2022 ?PT Individual Time: 7353-2992 ?PT Individual Time Calculation (min): 75 min  ? ?Short Term Goals: ?Week 1:  PT Short Term Goal 1 (Week 1): Pt will perform bed mobility at Supervision level ?PT Short Term Goal 2 (Week 1): Pt will navigate stairs with min A and LRAD ?PT Short Term Goal 3 (Week 1): Pt will complete Berg Balance Test to determine fall risk ? ?Skilled Therapeutic Interventions/Progress Updates:  ?Pt received seated in recliner in room, agreeable to early start to PM therapy session. Pt reports ongoing R shoulder pain and R hip pain, utilizing kpad for R shoulder and ice pack for R hip. Sit to stand with min A to RW during session. Ambulation 2 x 200 ft with RW and min A for balance during session. Session focus on balance assessment and completion of several functional outcome measures (Berg, DGI) as noted below. Patient demonstrates increased fall risk as noted by score of 13/56 on Berg Balance Scale.  (<36= high risk for falls, close to 100%; 37-45 significant >80%; 46-51 moderate >50%; 52-55 lower >25%). Discussed scores on functional outcome measures and functional implications. Pt especially exhibits impaired balance when performing turns in standing even with use of RW. Standing alt L/R cone taps with RW and min A for balance, 2 x 15 reps. Pt exhibits decreased control of RLE as compared to LLE with several instance of LE buckling at times too during standing. Pt returned to recliner in room at end of session, needs in reach, wife present. ? ?Therapy Documentation ?Precautions:  ?Precautions ?Precautions: Fall ?Restrictions ?Weight Bearing Restrictions: No ? ?Balance: ?Balance ?Balance Assessed: Yes ?Standardized Balance Assessment ?Standardized Balance Assessment: Berg Balance Test;Dynamic Gait Index ?Berg Balance Test ?Sit to Stand: Needs minimal aid to  stand or to stabilize ?Standing Unsupported: Able to stand 2 minutes with supervision ?Sitting with Back Unsupported but Feet Supported on Floor or Stool: Able to sit safely and securely 2 minutes ?Stand to Sit: Uses backs of legs against chair to control descent ?Transfers: Needs one person to assist ?Standing Unsupported with Eyes Closed: Needs help to keep from falling (per pt report trouble with this prior to admission) ?Standing Ubsupported with Feet Together: Needs help to attain position but able to stand for 30 seconds with feet together ?From Standing, Reach Forward with Outstretched Arm: Loses balance while trying/requires external support ?From Standing Position, Pick up Object from Floor: Unable to try/needs assist to keep balance ?From Standing Position, Turn to Look Behind Over each Shoulder: Needs assist to keep from losing balance and falling ?Turn 360 Degrees: Needs assistance while turning ?Standing Unsupported, Alternately Place Feet on Step/Stool: Needs assistance to keep from falling or unable to try ?Standing Unsupported, One Foot in Front: Needs help to step but can hold 15 seconds ?Standing on One Leg: Unable to try or needs assist to prevent fall ?Total Score: 13 ?Dynamic Gait Index ?Level Surface: Mild Impairment ?Change in Gait Speed: Moderate Impairment ?Gait with Horizontal Head Turns: Moderate Impairment ?Gait with Vertical Head Turns: Mild Impairment ?Gait and Pivot Turn: Severe Impairment ?Step Over Obstacle: Mild Impairment ?Step Around Obstacles: Mild Impairment ?Steps: Moderate Impairment ?Total Score: 11 ?Timed Up and Go Test ?TUG: Normal TUG ?Normal TUG (seconds): 21 ? ? ? ? ?Therapy/Group: Individual Therapy ? ? ?Excell Seltzer, PT, DPT, CSRS ? ?01/26/2022, 2:41 PM  ?

## 2022-01-26 NOTE — Progress Notes (Signed)
Inpatient Rehabilitation  Patient information reviewed and entered into eRehab system by Renn Stille M. Jamicia Haaland, M.A., CCC/SLP, PPS Coordinator.  Information including medical coding, functional ability and quality indicators will be reviewed and updated through discharge.    

## 2022-01-26 NOTE — Evaluation (Signed)
Physical Therapy Assessment and Plan ? ?Patient Details  ?Name: Timothy Moran ?MRN: [id number] ?Date of Birth: [birth year] ? ?PT Diagnosis: Abnormal posture, Abnormality of gait, and Pain in joint ?Rehab Potential: Good ?ELOS: 10-12 days  ? ?Today's Date: 01/26/2022 ?PT Individual Time: 1100-1200 ?PT Individual Time Calculation (min): 60 min   ? ?Hospital Problem: Principal Problem: ?  Right arm weakness ?Active Problems: ?  Anemia ?  Leukocytosis ? ? ?Past Medical History:  ?Past Medical History:  ?Diagnosis Date  ? Acute blood loss anemia 10/05/2020  ? Acute respiratory failure ([place]), hypothermia therapy, vent - extubated 10/06/20   ? AKI (acute kidney injury) ([place]) 10/05/2020  ? Anoxic brain injury ([place]) 10/26/2020  ? Arrhythmia   ? Ascending aortic aneurysm ([place]) 01/29/2018  ? a. 2019 4.3 cm by echo; b. 02/2021 Echo: Ao root 19m.  ? Benign brain tumor ([place]   ? Benign neoplasm of brain ([place] 12/19/2017  ? Cardiac arrest with ventricular fibrillation ([place]   ? Chest pain 12/04/2017  ? Chronic kidney disease, stage III (moderate) ([place] 12/19/2017  ? CKD (chronic kidney disease), stage III - IV (HL[place]   ? Coronary Artery Disease   ? a. 13/2202Inf STEMI complicated by cardiac arrest/PCI: LAD 50p/m, LCX 100d (2.5 x 30 Resolute Onyx DES); b. 04/2021 PCI: 04/2021 LM nl, LAD 50p/839m2.5x30 Onyx Frontier DES), LCX 25p, 5019mFR 0.98), 40d ISR (RFR 0.98), OM2 25, RCA mild diff dzs.  ? Diabetes mellitus, type 2 (HCCRemsen1/25/2022  ? 10/06/20 A1C 7%  ? Dizziness 10/11/2019  ? Encounter for imaging study to confirm orogastric (OG) tube placement   ? Hematuria 10/26/2020  ? HFrEF (heart failure with reduced ejection fraction) ([place] ? a. 11/2020 Echo: EF 25-30%; b. 02/2021 Echo: EF 25-30%, glob HK. Mild LVH. Nl RV fxn. Triv AI. Ao root 33m69m? History of pulmonary embolus (PE)   ? HLD (hyperlipidemia) 10/26/2020  ? Hypertension   ? Iron deficiency anemia rec'd IV iron 10/26/2020  ? Ischemic cardiomyopathy   ? a. 11/2020 Echo: EF  25-30%; b. 02/2021 Echo: EF 25-30%, glob HK.  ? Left bundle branch block 12/19/2017  ? Meningioma (HCC)[place]/11/2019  ? Nonsustained ventricular tachycardia (HCC)[place]/29/2021  ? Pain of left hip joint 05/29/2020  ? Personal history of pulmonary embolism 12/19/2017  ? Pneumonia of both lungs due to infectious organism   ? Pulmonary hypertension due to thromboembolism (HCC)[place]/28/2019  ? See echo 12/27/17 with nl PAS vs CTa chest 02/23/18   ? S/P angioplasty with stent 10/04/20 DES to LCX  10/26/2020  ? Sinus bradycardia 12/22/2017  ? SOB (shortness of breath)   ? Solitary pulmonary nodule on lung CT 03/08/2018  ? CT 12/04/17 1.0 x 0.8 x 0.7 cm nodular opacity in the RUL vs not seen 03/16/10 posterior segment of the right upper lobe. .SpiMarland Kitchenometry 03/08/2018    FEV1 3.86 (108%)  Ratio 96 s prior rx  - PET  03/13/18   Low grade c/w adenoca > rec T surgery eval    ? STEMI (ST elevation myocardial infarction) (HCC)[place]/23/2022  ? Urinary retention 10/26/2020  ? Vestibular schwannoma (HCC)[facility]/11/2019  ? ?Past Surgical History:  ?Past Surgical History:  ?Procedure Laterality Date  ? APPENDECTOMY    ? BIV ICD INSERTION CRT-D N/A 11/03/2021  ? Procedure: BIV ICD INSERTION CRT-D;  Surgeon: CamnConstance Haw;  [facility];  Service: Cardiovascular;  Laterality: N/A;  ? CARDIAC CATHETERIZATION    ?  CORONARY ANGIOPLASTY WITH STENT PLACEMENT Left 04/12/2021  ? LAD stent placement  ? CORONARY STENT INTERVENTION N/A 04/12/2021  ? Procedure: CORONARY STENT INTERVENTION;  Surgeon: Nelva Bush, MD;  Location: [facility];  Service: Cardiovascular;  Laterality: N/A;  ? CORONARY/GRAFT ACUTE MI REVASCULARIZATION N/A 10/04/2020  ? Procedure: Coronary/Graft Acute MI Revascularization;  Surgeon: Nelva Bush, MD;  Location: [facility];  Service: Cardiovascular;  Laterality: N/A;  ? HERNIA REPAIR    ? INTRAVASCULAR PRESSURE WIRE/FFR STUDY N/A 04/12/2021  ? Procedure: INTRAVASCULAR PRESSURE WIRE/FFR STUDY;  Surgeon:  Nelva Bush, MD;  Location: [facility];  Service: Cardiovascular;  Laterality: N/A;  ? INTRAVASCULAR ULTRASOUND/IVUS N/A 04/12/2021  ? Procedure: Intravascular Ultrasound/IVUS;  Surgeon: Nelva Bush, MD;  Location: [facility];  Service: Cardiovascular;  Laterality: N/A;  ? LEFT HEART CATH AND CORONARY ANGIOGRAPHY N/A 10/04/2020  ? Procedure: LEFT HEART CATH AND CORONARY ANGIOGRAPHY;  Surgeon: Nelva Bush, MD;  Location: [facility];  Service: Cardiovascular;  Laterality: N/A;  ? RIGHT/LEFT HEART CATH AND CORONARY ANGIOGRAPHY N/A 04/12/2021  ? Procedure: RIGHT/LEFT HEART CATH AND CORONARY ANGIOGRAPHY;  Surgeon: Nelva Bush, MD;  Location: [facility];  Service: Cardiovascular;  Laterality: N/A;  ? ? ?Assessment & Plan ?Clinical Impression:  ?Timothy Moran is a 76 year old male who was leaving a friend's home on the evening of 01/21/2022 when he missed stepped fell backward and struck the back of his head.  He complained of right upper and right lower extremity paralysis.  He was brought to the emergency department where CT of the head revealed left parafalcine meningioma with associated vasogenic edema in the left posterior frontal and parietal lobes.  Neurology and neurosurgery were consulted.  He reported he did not lose consciousness.  On examination his right upper extremity weakness was minimally improved and he could lift his right upper extremity off the bed but remained weak and clumsy.  He also had numbness in both right upper and right lower extremities.  He was started on Decadron and MRI of the brain and C-spine were performed.  His past medical history is significant for known meningioma status post gamma knife radiation to his brain lesion and reports he was to [facility] at [facility] next week.  He has remained alert and oriented.  His cervical spine MRI demonstrates mild bulging without significant neural compression.  Neurology has concluded  his right-sided weakness favored to be multifactorial possibly secondary to new area of edema of the left frontoparietal white matter on MRI and adjacent meningioma, possible brachial plexus injury secondary to his fall.  Possibility of cervical cord contusion secondary to fall.  He remains on steroids and both neurology and neurosurgery have signed off.  Cardiology was consulted due to possible atrial fibrillation, however EKG and telemetry both showed sinus rhythm with PACs. The patient requires inpatient medicine and rehabilitation evaluations and services for ongoing dysfunction secondary to left parafalcine meningioma with vasogenic edema. Patient transferred to CIR on 01/25/2022 .  ? ?Patient currently requires min to mod A with mobility secondary to muscle weakness, decreased cardiorespiratoy endurance, and decreased standing balance, decreased postural control, and decreased balance strategies.  Prior to hospitalization, patient was independent  with mobility and lived with Spouse in a House home.  Home access is 7Stairs to enter. ? ?Patient will benefit from skilled PT intervention to maximize safe functional mobility, minimize fall risk, and decrease caregiver burden for planned discharge home with 24 hour supervision.  Anticipate patient will benefit from follow up [place] at discharge. ? ?PT - End of Session ?Activity Tolerance: Tolerates 30+ min activity with multiple rests ?Endurance Deficit: Yes ?Endurance Deficit Description: frequent rest breaks during functional activity; increased RR with exertion ?PT Assessment ?Rehab Potential (ACUTE/IP ONLY): Good ?PT Patient demonstrates impairments in the following area(s): Balance;Endurance;Pain;Safety ?PT Transfers Functional Problem(s): Bed Mobility;Bed to Chair;Car;Furniture;Floor ?PT Locomotion Functional Problem(s): Ambulation;Wheelchair Mobility;Stairs ?PT Plan ?PT Intensity: Minimum of 1-2 x/day ,45 to 90 minutes ?PT Frequency: 5 out of 7 days ?PT Duration  Estimated Length of Stay: 10-12 days ?PT Treatment/Interventions: Ambulation/gait training;Balance/vestibular training;Community reintegration;Discharge planning;DME/adaptive equipment instruction;Funct

## 2022-01-26 NOTE — Evaluation (Signed)
Occupational Therapy Assessment and Plan ? ?Patient Details  ?Name: Timothy Moran ?MRN: [id number] ?Date of Birth: Jan 13, 1946 ? ?OT Diagnosis: ataxia and hemiplegia affecting dominant side ?Rehab Potential: Rehab Potential (ACUTE ONLY): Good ?ELOS: 10-12 days  ? ?Today's Date: 01/26/2022 ?OT Individual Time: 0900-1000 ?OT Individual Time Calculation (min): 60 min    ? ?Hospital Problem: Principal Problem: ?  Right arm weakness ?Active Problems: ?  Anemia ?  Leukocytosis ? ? ?Past Medical History:  ?Past Medical History:  ?Diagnosis Date  ? Acute blood loss anemia 10/05/2020  ? Acute respiratory failure ([place]), hypothermia therapy, vent - extubated 10/06/20   ? AKI (acute kidney injury) ([place]) 10/05/2020  ? Anoxic brain injury ([place]) 10/26/2020  ? Arrhythmia   ? Ascending aortic aneurysm ([place]) 01/29/2018  ? a. 2019 4.3 cm by echo; b. 02/2021 Echo: Ao root 33m.  ? Benign brain tumor ([place]   ? Benign neoplasm of brain ([facility] 12/19/2017  ? Cardiac arrest with ventricular fibrillation (H[place]   ? Chest pain 12/04/2017  ? Chronic kidney disease, stage III (moderate) ([place] 12/19/2017  ? CKD (chronic kidney disease), stage III - IV ([place]   ? Coronary Artery Disease   ? a. 11/0626Inf STEMI complicated by cardiac arrest/PCI: LAD 50p/m, LCX 100d (2.5 x 30 Resolute Onyx DES); b. 04/2021 PCI: 04/2021 LM nl, LAD 50p/842m2.5x30 Onyx Frontier DES), LCX 25p, 503mFR 0.98), 40d ISR (RFR 0.98), OM2 25, RCA mild diff dzs.  ? Diabetes mellitus, type 2 (HCCCanton City1/25/2022  ? 10/06/20 A1C 7%  ? Dizziness 10/11/2019  ? Encounter for imaging study to confirm orogastric (OG) tube placement   ? Hematuria 10/26/2020  ? HFrEF (heart failure with reduced ejection fraction) ([place]CCA[facility] ? a. 11/2020 Echo: EF 25-30%; b. 02/2021 Echo: EF 25-30%, glob HK. Mild LVH. Nl RV fxn. Triv AI. Ao root 100m81m? History of pulmonary embolus (PE)   ? HLD (hyperlipidemia) 10/26/2020  ? Hypertension   ? Iron deficiency anemia rec'd IV iron 10/26/2020  ? Ischemic cardiomyopathy    ? a. 11/2020 Echo: EF 25-30%; b. 02/2021 Echo: EF 25-30%, glob HK.  ? Left bundle branch block 12/19/2017  ? Meningioma (HCC)[place]/11/2019  ? Nonsustained ventricular tachycardia (HCC)[place]/29/2021  ? Pain of left hip joint 05/29/2020  ? Personal history of pulmonary embolism 12/19/2017  ? Pneumonia of both lungs due to infectious organism   ? Pulmonary hypertension due to thromboembolism (HCC)[place]/28/2019  ? See echo 12/27/17 with nl PAS vs CTa chest 02/23/18   ? S/P angioplasty with stent 10/04/20 DES to LCX  10/26/2020  ? Sinus bradycardia 12/22/2017  ? SOB (shortness of breath)   ? Solitary pulmonary nodule on lung CT 03/08/2018  ? CT 12/04/17 1.0 x 0.8 x 0.7 cm nodular opacity in the RUL vs not seen 03/16/10 posterior segment of the right upper lobe. .SpiMarland Kitchenometry 03/08/2018    FEV1 3.86 (108%)  Ratio 96 s prior rx  - PET  03/13/18   Low grade c/w adenoca > rec T surgery eval    ? STEMI (ST elevation myocardial infarction) (HCC)[place]/23/2022  ? Urinary retention 10/26/2020  ? Vestibular schwannoma (HCC)[place]/11/2019  ? ?Past Surgical History:  ?Past Surgical History:  ?Procedure Laterality Date  ? APPENDECTOMY    ? BIV ICD INSERTION CRT-D N/A 11/03/2021  ? Procedure: BIV ICD INSERTION CRT-D;  Surgeon: CamnConstance Haw;  [facility];  Service: Cardiovascular;  Laterality: N/A;  ? CARDIAC CATHETERIZATION    ?  CORONARY ANGIOPLASTY WITH STENT PLACEMENT Left 04/12/2021  ? LAD stent placement  ? CORONARY STENT INTERVENTION N/A 04/12/2021  ? Procedure: CORONARY STENT INTERVENTION;  Surgeon: Nelva Bush, MD;  Location: [place] [facility];  Service: Cardiovascular;  Laterality: N/A;  ? CORONARY/GRAFT ACUTE MI REVASCULARIZATION N/A 10/04/2020  ? Procedure: Coronary/Graft Acute MI Revascularization;  Surgeon: Nelva Bush, MD;  Location: [facility] [facility];  Service: Cardiovascular;  Laterality: N/A;  ? HERNIA REPAIR    ? INTRAVASCULAR PRESSURE WIRE/FFR STUDY N/A 04/12/2021  ? Procedure: INTRAVASCULAR PRESSURE  WIRE/FFR STUDY;  Surgeon: Nelva Bush, MD;  Location: [place] [facility];  Service: Cardiovascular;  Laterality: N/A;  ? INTRAVASCULAR ULTRASOUND/IVUS N/A 04/12/2021  ? Procedure: Intravascular Ultrasound/IVUS;  Surgeon: Nelva Bush, MD;  Location: [facility];  Service: Cardiovascular;  Laterality: N/A;  ? LEFT HEART CATH AND CORONARY ANGIOGRAPHY N/A 10/04/2020  ? Procedure: LEFT HEART CATH AND CORONARY ANGIOGRAPHY;  Surgeon: Nelva Bush, MD;  Location: [facility];  Service: Cardiovascular;  Laterality: N/A;  ? RIGHT/LEFT HEART CATH AND CORONARY ANGIOGRAPHY N/A 04/12/2021  ? Procedure: RIGHT/LEFT HEART CATH AND CORONARY ANGIOGRAPHY;  Surgeon: Nelva Bush, MD;  Location: [facility];  Service: Cardiovascular;  Laterality: N/A;  ? ? ?Assessment & Plan ?Clinical Impression: Timothy Moran is a 76 year old male who was leaving a friend's home on the evening of 01/21/2022 when he missed stepped fell backward and struck the back of his head.  He complained of right upper and right lower extremity paralysis.  He was brought to the emergency department where CT of the head revealed left parafalcine meningioma with associated vasogenic edema in the left posterior frontal and parietal lobes.  Neurology and neurosurgery were consulted.  He reported he did not lose consciousness.  On examination his right upper extremity weakness was minimally improved and he could lift his right upper extremity off the bed but remained weak and clumsy.  He also had numbness in both right upper and right lower extremities.  He was started on Decadron and MRI of the brain and C-spine were performed.  His past medical history is significant for known meningioma status post gamma knife radiation to his brain lesion and reports he was to follow-up at [facility] next week.  He has remained alert and oriented.  His cervical spine MRI demonstrates mild bulging without significant neural compression.   Neurology has concluded his right-sided weakness favored to be multifactorial possibly secondary to new area of edema of the left frontoparietal white matter on MRI and adjacent meningioma, possible brachial plexus injury secondary to his fall.  Possibility of cervical cord contusion secondary to fall.  He remains on steroids and both neurology and neurosurgery have signed off.  Cardiology was consulted due to possible atrial fibrillation, however EKG and telemetry both showed sinus rhythm with PACs. The patient requires inpatient medicine and rehabilitation evaluations and services for ongoing dysfunction secondary to left parafalcine meningioma with vasogenic edema. ?  ?The patient's past medical history is significant for coronary artery disease status post stent placement, congestive heart failure, chronic kidney disease with indwelling Foley secondary to urinary retention.Patient transferred to CIR on 01/25/2022 .   ? ?Patient currently requires min with basic self-care skills secondary to muscle weakness, ataxia, and decreased standing balance, decreased postural control, and decreased balance strategies.  Prior to hospitalization, patient could complete ADLs with independent . ? ?Patient will benefit from skilled intervention to decrease level of assist with basic self-care skills  and increase level of independence with iADL prior to discharge home with care partner.  Anticipate patient will require 24 hour supervision and follow up outpatient. ? ?OT - End of Session ?Activity Tolerance: Tolerates 30+ min activity with multiple rests ?Endurance Deficit: Yes ?Endurance Deficit Description: frequent rest breaks during functional activity; increased RR with exertion ?OT Assessment ?Rehab Potential (ACUTE ONLY): Good ?OT Patient demonstrates impairments in the following area(s): Balance;Safety;Motor;Pain;Perception;Endurance ?OT Basic ADL's Functional Problem(s): Bathing;Dressing;Toileting ?OT Transfers Functional  Problem(s): Tub/Shower;Toilet ?OT Additional Impairment(s): Fuctional Use of Upper Extremity ?OT Plan ?OT Intensity: Minimum of 1-2 x/day, 45 to 90 minutes ?OT Frequency: 5 out of 7 days ?OT Duration/Est

## 2022-01-26 NOTE — Progress Notes (Signed)
Patient ID: Timothy Moran, male   DOB: 1946/01/12, 76 y.o.   MRN: [id number] ? ?Met with patient and spouse in room, introduced myself, and explained my role in his care. Patient was pleasant but tired from therapy. Stated that his right shoulder didn't have full movement and has no fine motor grasp in his right hand. He mentioned that he has an upcoming prostate surgery. This RN explained the educational materials added to his [employer], the notebook is his, and to take home at discharge. He had no further questions at this time. I will continue to follow patient's progress. ? ?Dorthula Nettles, RN3, BSN, CBIS, CRRN, WTA ?Care Coordinator, Inpatient Rehabilitation ?Office [phone]  ?

## 2022-01-26 NOTE — Progress Notes (Signed)
?                                                       PROGRESS NOTE ? ? ?Subjective/Complaints: ?In bed this AM. No new complaints or concerns. Reports he is gradually getting stronger.  ? ? ? ?Review of Systems  ?Constitutional:  Negative for chills and fever.  ?Respiratory:  Negative for shortness of breath.   ?Cardiovascular:  Negative for chest pain.  ?Gastrointestinal:  Negative for abdominal pain, nausea and vomiting.  ? ?  ?Objective: ?  ?No results found. ?Recent Labs  ?  01/26/22 ?0608  ?WBC 10.9*  ?HGB 9.5*  ?HCT 28.9*  ?PLT 243  ? ? ?Recent Labs  ?  01/25/22 ?0310 01/26/22 ?0608  ?NA 137 139  ?K 4.6 4.8  ?CL 111 114*  ?CO2 18* 19*  ?GLUCOSE 151* 123*  ?BUN 64* 61*  ?CREATININE 2.37* 2.01*  ?CALCIUM 8.6* 8.9  ? ? ? ?Intake/Output Summary (Last 24 hours) at 01/26/2022 0953 ?Last data filed at 01/26/2022 0813 ?Gross per 24 hour  ?Intake 1676 ml  ?Output --  ?Net 1676 ml  ? ?  ? ?  ? ?Physical Exam: ?Vital Signs ?Blood pressure 129/78, pulse 65, temperature 97.7 ?F (36.5 ?C), resp. rate 18, height '6\' 1"'$  (1.854 m), SpO2 97 %. ? ? ?General: No apparent distress ?HEENT: Head is normocephalic, atraumatic, PERRLA, EOMI, sclera anicteric, oral mucosa pink and moist, wearing glasses ?Neck: Supple without JVD or lymphadenopathy ?Heart: Reg rate and rhythm. No murmurs rubs or gallops ?Chest: CTA bilaterally without wheezes, rales, or rhonchi; no distress ?Abdomen: Soft, non-tender, non-distended, bowel sounds positive. ?Extremities: No clubbing, cyanosis, or edema.  ?Psych: Pt's affect is appropriate. Pt is cooperative ?Skin: Clean and intact without signs of breakdown ?Neuro:  Alert and oriented x 3. Normal language and speech. CN grossly intact. No sensory findings. LUE 5/5. LLE 4/5. RUE 2-3/5 prox to 4+/5 distal. RLE 4/5.  No obvious limb ataxia. ?Musculoskeletal: pt with pain to palpation near acromium. +Pain with shoulder IR/ER and impingement testing.  ?Tone normal ?No joint swelling/redness noted ?PIV  LUE ? ?Assessment/Plan: ?1. Functional deficits which require 3+ hours per day of interdisciplinary therapy in a comprehensive inpatient rehab setting. ?Physiatrist is providing close team supervision and 24 hour management of active medical problems listed below. ?Physiatrist and rehab team continue to assess barriers to discharge/monitor patient progress toward functional and medical goals ? ?Care Tool: ? ?Bathing ?   ?   ?   ?  ?  ?Bathing assist   ?  ?  ?Upper Body Dressing/Undressing ?Upper body dressing   ?What is the patient wearing?: [place] only ?   ?Upper body assist Assist Level: Minimal Assistance - Patient > 75% ?   ?Lower Body Dressing/Undressing ?Lower body dressing ? ? ?   ?What is the patient wearing?: [place] only ? ?  ? ?Lower body assist Assist for lower body dressing: Minimal Assistance - Patient > 75% ?   ? ?Toileting ?Toileting    ?Toileting assist Assist for toileting: Minimal Assistance - Patient > 75% ?  ?  ?Transfers ?Chair/bed transfer ? ?Transfers assist ?   ? ?  ?  ?  ?Locomotion ?Ambulation ? ? ?Ambulation assist ? ?   ? ?  ?  ?   ? ?  Walk 10 feet activity ? ? ?Assist ?   ? ?  ?   ? ?Walk 50 feet activity ? ? ?Assist   ? ?  ?   ? ? ?Walk 150 feet activity ? ? ?Assist   ? ?  ?  ?  ? ?Walk 10 feet on uneven surface  ?activity ? ? ?Assist   ? ? ?  ?   ? ?Wheelchair ? ? ? ? ?Assist   ?  ?  ? ?  ?   ? ? ?Wheelchair 50 feet with 2 turns activity ? ? ? ?Assist ? ?  ?  ? ? ?   ? ?Wheelchair 150 feet activity  ? ? ? ?Assist ?   ? ? ?   ? ?Blood pressure 129/78, pulse 65, temperature 97.7 ?F (36.5 ?C), resp. rate 18, height '6\' 1"'$  (1.854 m), SpO2 97 %. ? ?Medical Problem List and Plan: ?1. Functional deficits secondary to fall, left parafalcine meningioma, right sided weakness.  ?            -suspect right rotator cuff injury also. MRI ordered ?            -patient may shower ?            -ELOS/Goals: 7-10 days, mod I goals ?             -PT /OT evals ?2.   Antithrombotics: ?-DVT/anticoagulation:  Mechanical:  Antiembolism stockings, knee (TED hose) Bilateral lower extremities ?            -antiplatelet therapy: aspirin only; Plavix discontinued ?3. Pain Management: Tylenol, oxycodone as needed ?4. Mood: LCSW to evaluate and provide emotional support ?            -antipsychotic agents: n/a ?5. Neuropsych: This patient is capable of making decisions on his own behalf. ?6. Skin/Wound Care: Routine skin care checks ?7. Fluids/Electrolytes/Nutrition: Routine Is and Os and follow-up chemistries ?8: Chronic systolic HF due to ischemic cardiomyopathy: EF 30-35% ?            --s/p biventricular ICD implantation [name] 10/2021 ?            --continue Isordil 30 mg BID ?            -follow weights ?9: CAD: acute STEMI 2022, s/p DES ?            --continuing aspirin; Plavix discontinued ?10: Ascending aortic aneurysm: 4.2 cm on CTA chest 09/18/2020 ?            --follow-up PCP/Dr. Roxan Hockey as outpatient ?11: CKD 3b: baseline creatinine 2.2-2.4 ?            --Cr = 2.37 5/16, Cr 2.01 on 7/17 stable, continue to follow ?12: DM-2: no meds; trend glucoses (last A1c was 7.0 in 09/2020) ?13: History of PE: remote (2011); no AC ?14: Hypertension: continue Toprol XL 50 mg daily ?--Home hydralazine and ARB held ?-BP controlled at present ?15: Hyperlipidemia: continue Lipitor 80 mg daily ?16: Chronic urinary retention: indwelling Foley catheter since 09/2020 ?            -continue with current mgt ?17: Insomnia, chronic: continue melatonin 5 mg q HS ?18: Anemia; mild, chronic ? -5/17 Hemoglobin 9.5 continue to monitor ?19: Leukocytosis: likely reactive to steroids; follow-up CBC with diff             ?            --5/17 improved  to 10.9, follow ?20. Bradycardia ? -Last HR 65, continue to monitor, he is on a BB ?  ? ?LOS: ?1 days ?A FACE TO FACE EVALUATION WAS PERFORMED ? ?Jennye Boroughs ?01/26/2022, 9:53 AM  ? ? ? ?

## 2022-01-26 NOTE — Plan of Care (Signed)
?  Problem: RH Balance ?Goal: LTG Patient will maintain dynamic standing balance (PT) ?Description: LTG:  Patient will maintain dynamic standing balance with assistance during mobility activities (PT) ?Flowsheets (Taken 01/26/2022 1252) ?LTG: Pt will maintain dynamic standing balance during mobility activities with:: Supervision/Verbal cueing ?  ?Problem: Sit to Stand ?Goal: LTG:  Patient will perform sit to stand with assistance level (PT) ?Description: LTG:  Patient will perform sit to stand with assistance level (PT) ?Flowsheets (Taken 01/26/2022 1252) ?LTG: PT will perform sit to stand in preparation for functional mobility with assistance level: Supervision/Verbal cueing ?  ?Problem: RH Bed Mobility ?Goal: LTG Patient will perform bed mobility with assist (PT) ?Description: LTG: Patient will perform bed mobility with assistance, with/without cues (PT). ?Flowsheets (Taken 01/26/2022 1252) ?LTG: Pt will perform bed mobility with assistance level of: Independent with assistive device  ?  ?Problem: RH Bed to Chair Transfers ?Goal: LTG Patient will perform bed/chair transfers w/assist (PT) ?Description: LTG: Patient will perform bed to chair transfers with assistance (PT). ?Flowsheets (Taken 01/26/2022 1252) ?LTG: Pt will perform Bed to Chair Transfers with assistance level: Supervision/Verbal cueing ?  ?Problem: RH Car Transfers ?Goal: LTG Patient will perform car transfers with assist (PT) ?Description: LTG: Patient will perform car transfers with assistance (PT). ?Flowsheets (Taken 01/26/2022 1252) ?LTG: Pt will perform car transfers with assist:: Supervision/Verbal cueing ?  ?Problem: RH Ambulation ?Goal: LTG Patient will ambulate in controlled environment (PT) ?Description: LTG: Patient will ambulate in a controlled environment, # of feet with assistance (PT). ?Flowsheets (Taken 01/26/2022 1252) ?LTG: Pt will ambulate in controlled environ  assist needed:: Supervision/Verbal cueing ?LTG: Ambulation distance in  controlled environment: 300 ft ?Goal: LTG Patient will ambulate in home environment (PT) ?Description: LTG: Patient will ambulate in home environment, # of feet with assistance (PT). ?Flowsheets (Taken 01/26/2022 1252) ?LTG: Pt will ambulate in home environ  assist needed:: Supervision/Verbal cueing ?LTG: Ambulation distance in home environment: 150 ft ?  ?Problem: RH Stairs ?Goal: LTG Patient will ambulate up and down stairs w/assist (PT) ?Description: LTG: Patient will ambulate up and down # of stairs with assistance (PT) ?Flowsheets (Taken 01/26/2022 1252) ?LTG: Pt will ambulate up/down stairs assist needed:: Supervision/Verbal cueing ?LTG: Pt will  ambulate up and down number of stairs: 7 stairs with R handrail ?  ?

## 2022-01-27 DIAGNOSIS — I25810 Atherosclerosis of coronary artery bypass graft(s) without angina pectoris: Secondary | ICD-10-CM

## 2022-01-27 DIAGNOSIS — I10 Essential (primary) hypertension: Secondary | ICD-10-CM | POA: Diagnosis not present

## 2022-01-27 DIAGNOSIS — D329 Benign neoplasm of meninges, unspecified: Secondary | ICD-10-CM | POA: Diagnosis not present

## 2022-01-27 DIAGNOSIS — R001 Bradycardia, unspecified: Secondary | ICD-10-CM | POA: Diagnosis not present

## 2022-01-27 DIAGNOSIS — R29898 Other symptoms and signs involving the musculoskeletal system: Secondary | ICD-10-CM | POA: Diagnosis not present

## 2022-01-27 MED ORDER — METOPROLOL SUCCINATE ER 25 MG PO TB24
25.0000 mg | ORAL_TABLET | Freq: Every day | ORAL | Status: DC
  Administered 2022-01-28 – 2022-02-02 (×6): 25 mg via ORAL
  Filled 2022-01-27 (×6): qty 1

## 2022-01-27 NOTE — Progress Notes (Signed)
Physical Therapy Session Note  Patient Details  Name: Timothy Moran MRN: [id number] Date of Birth: 06/09/1946  Today's Date: 01/27/2022 PT Individual Time: 0845-0947 and 1414-1527 PT Individual Time Calculation (min): 62 min and 73 min  Short Term Goals: Week 1:  PT Short Term Goal 1 (Week 1): Pt will perform bed mobility at Supervision level PT Short Term Goal 2 (Week 1): Pt will navigate stairs with min A and LRAD PT Short Term Goal 3 (Week 1): Pt will complete Berg Balance Test to determine fall risk  Skilled Therapeutic Interventions/Progress Updates: Tx1: Pt presented in recliner agreeable to therapy. Pt states some pain in R hip and shoulder with movement, did not request pain meds at start of session with some education provided re: not to push through pain. Pt performed Sit to stand with CGA and RW with use of BUE for pushing up. Performed ambulatory transfer to w/c and transported to day room for energy conservation. Participated in ambulation through day room and around 4W nsg station with RW and overall CGA with mild lateral sway noted but no LOB. Pt then participated in toe taps to target on 2in step, initially with RLE only, then LLE, and alternating x 10 each. Pt with x 1 occurrence of R knee buckling when performing activity with LLE but able to recover without assist. Participated in x 3 bouts of horseshoes initially with RUE then LUE x 8 each. Last round performed using RUE only and x 4 with RLE on 2in step then remaining 4 with LLE on 2 in step. Pt noted to have increased instability with LLE on step but no LOB. Pt then ambulated weaving through cones with RW and CGA with min verbal cues to stay within RW as RLE intermittently outside of RW. Pt did demonstrate good safety with RW while weaving through cones. Pt returned to w/c and transported back to room for time management. Performed ambualatory transfer to recliner with RW and CGA. Pt left in recliner at end of session with seat  alarm on, call bell within reach and current needs met.   Tx2: Pt presented in recliner with wife present agreeable to therapy. Pt states some soreness in R hip, no intervention required during session. Pt asking if IV can be removed as had not been used since ED admission. PTA notified nsg who was able to remove prior to leaving room. Performed Sit to stand with CGA and ambulated to ortho gym with CGA. Pt noted to have improved cadence, stride length, and more fluidity of movement as compared to am session. In ortho gym pt participated in balance activities including standing without AD passing 1/2Kg weighted ball around body CW/CCW x 10 each. Pt then stood on compliant surface and due to decreased ankle strategy initially only worked on static balance. Pt was able to sustain standing ~2 min with intermittent R lean due to increased R knee flexion. Pt was able to correct with verbal cues to extend leg. As pt became fatigued pt noted to have some mild posterior lean but no LOB. After seated rest pt then participated in standing on compliant surface while picking up and placing clothespins on rack. Pt was able to interchangeably use both hands and maintain fair balance. During seated rest after this activities pt expressed concern regarding hip pain and was unsure if imaging was done.  PTA checked chart verified that imaging was done in ED and read results to pt who was appreciative. Pt then ambulated to rehab gym   with CGA and participated in ascending/descending stairs x 12 total . First x 8 pt ascending with step to pattern with pt attempting to ascend with RLE on second 4. On third bout pt was able to ascend/descend x 4 using B rails and step through pattern. All trials performed with CGA. Pt then participated in Biodex LOS on static surface with first trial holding hand rails and requiring cues from PTA to maintain R TKE as well as to perform weight shifting vs leading with hips/shoulders. On second bout pt  scores 29% in 1 min without UE support.  Pt then participated in catch game x 2 min without AD demonstrating improved ankle strategy and decreased R lateral lean. When pt stepped off Biodex increased RLE weakness with near buckling noted. Pt able to step to chair for seated rest. Pt rested  ~3-4 min and was then able to ambulate to high/low mat where pt participated in seated use of rebounder 2 x 30 for sustained activity. Pt was able to ambulate back to room with RW and CGA with no buckling noted. Pt returned to recliner at end of session and left with seat alarm on, call bell within reach and needs met.         Therapy Documentation Precautions:  Precautions Precautions: Fall Restrictions Weight Bearing Restrictions: No General:   Vital Signs:  Pain: Pain Assessment Pain Scale: 0-10 Pain Score: 0-No pain Mobility:   Locomotion :    Trunk/Postural Assessment :    Balance:   Exercises:   Other Treatments:      Therapy/Group: Individual Therapy  Agripina Guyette 01/27/2022, 12:30 PM

## 2022-01-27 NOTE — Progress Notes (Signed)
Occupational Therapy Session Note  Patient Details  Name: Timothy Moran MRN: [id number] Date of Birth: 02/17/1946  Today's Date: 01/27/2022 OT Individual Time: 4196-2229 OT Individual Time Calculation (min): 73 min    Short Term Goals: Week 1:  OT Short Term Goal 1 (Week 1): Pt will complete grooming task in standing at the sink with (S) OT Short Term Goal 2 (Week 1): Pt will complete LB dressing with CGA OT Short Term Goal 3 (Week 1): Pt will complete toileting tasks with CGA  Skilled Therapeutic Interventions/Progress Updates:    Pt resting in bed upon arrival and agreeable to getting OOB for therapy. Pt declined shower this morning, opting for bathing/dressing with sit<>stand from w/c at sink. Supine>sit EOB with supervision HOB elevated and using bed rails. Sit<>stand and amb with RW to sink with CGA. Pt completed bathing/dressing with sit<>stand from w/c at sink with CGA when standing. Pt able to tie shoe laces without assistance. Pt required assistance with threading catheter bag through pants. Pt wears leg bag at home after getting up. Pt amb with RW to ADL apartment and practiced furniture transfers and walk-in shower transfers. Sit<>stand from recliner in apartment with CGA. Walk-in shower transfer with CGA following demonstration. Pt has built-in seat and grab bars in shower at home. Recommended pt make a "dry run" with shower transers at home before taking a shower. Pt amb with RW back to room and returned to recliner. Pt remained in relciner with all needs within reach and seat alarm activated.   Therapy Documentation Precautions:  Precautions Precautions: Fall Restrictions Weight Bearing Restrictions: No Pain:  Pt reports increased pain in Rt hip with weight bearing and Rt shoulder with AROM >90; rest and repositioned   Therapy/Group: Individual Therapy  Leroy Libman 01/27/2022, 8:17 AM

## 2022-01-27 NOTE — Progress Notes (Signed)
Recreational Therapy Assessment and Plan  Patient Details  Name: Austen Oyster MRN: [id number] Date of Birth: 05-07-1946 Today's Date: 01/27/2022  Rehab Potential: Good  ELOS:   10 days  Assessment   Hospital Problem: Principal Problem:   Right arm weakness Active Problems:   Anemia   Leukocytosis     Past Medical History:      Past Medical History:  Diagnosis Date   Acute blood loss anemia 10/05/2020   Acute respiratory failure ([place]), hypothermia therapy, vent - extubated 10/06/20     AKI (acute kidney injury) ([place]) 10/05/2020   Anoxic brain injury ([place]) 10/26/2020   Arrhythmia     Ascending aortic aneurysm ([place]) 01/29/2018    a. 2019 4.3 cm by echo; b. 02/2021 Echo: Ao root 57mm.   Benign brain tumor ([place])     Benign neoplasm of brain ([place]) 12/19/2017   Cardiac arrest with ventricular fibrillation ([place])     Chest pain 12/04/2017   Chronic kidney disease, stage III (moderate) ([place]) 12/19/2017   CKD (chronic kidney disease), stage III - IV ([facility])     Coronary Artery Disease      a. 03/8468 Inf STEMI complicated by cardiac arrest/PCI: LAD 50p/m, LCX 100d (2.5 x 30 Resolute Onyx DES); b. 04/2021 PCI: 04/2021 LM nl, LAD 50p/30m (2.5x30 Onyx Frontier DES), LCX 25p, 45m (RFR 0.98), 40d ISR (RFR 0.98), OM2 25, RCA mild diff dzs.   Diabetes mellitus, type 2 ([place]) 10/06/2020    10/06/20 A1C 7%   Dizziness 10/11/2019   Encounter for imaging study to confirm orogastric (OG) tube placement     Hematuria 10/26/2020   HFrEF (heart failure with reduced ejection fraction) ([place])      a. 11/2020 Echo: EF 25-30%; b. 02/2021 Echo: EF 25-30%, glob HK. Mild LVH. Nl RV fxn. Triv AI. Ao root 87mm.   History of pulmonary embolus ([facility])     HLD (hyperlipidemia) 10/26/2020   Hypertension     Iron deficiency anemia rec'd IV iron 10/26/2020   Ischemic cardiomyopathy      a. 11/2020 Echo: EF 25-30%; b. 02/2021 Echo: EF 25-30%, glob HK.   Left bundle branch block 12/19/2017   Meningioma ([place]) 08/14/2020    Nonsustained ventricular tachycardia ([place]) 10/11/2019   Pain of left hip joint 05/29/2020   Personal history of pulmonary embolism 12/19/2017   Pneumonia of both lungs due to infectious organism     Pulmonary hypertension due to thromboembolism ([place]) 03/09/2018    See echo 12/27/17 with nl PAS vs CTa chest 02/23/18    S/P angioplasty with stent 10/04/20 DES to LCX  10/26/2020   Sinus bradycardia 12/22/2017   SOB (shortness of breath)     Solitary pulmonary nodule on lung CT 03/08/2018    CT 12/04/17 1.0 x 0.8 x 0.7 cm nodular opacity in the RUL vs not seen 03/16/10 posterior segment of the right upper lobe. [place] KitchenSpirometry 03/08/2018    FEV1 3.86 (108%)  Ratio 96 s prior rx  - PET  03/13/18   Low grade c/w adenoca > rec T surgery eval     STEMI (ST elevation myocardial infarction) ([place]) 10/04/2020   Urinary retention 10/26/2020   Vestibular schwannoma ([place]) 08/14/2020    Past Surgical History:       Past Surgical History:  Procedure Laterality Date   APPENDECTOMY       BIV ICD INSERTION CRT-D N/A 11/03/2021    Procedure: BIV ICD INSERTION CRT-D;  Surgeon: Constance Haw, MD;  Location: [facility];  Service: Cardiovascular;  Laterality: N/A;   CARDIAC CATHETERIZATION       CORONARY ANGIOPLASTY WITH STENT PLACEMENT Left 04/12/2021    LAD stent placement   CORONARY STENT INTERVENTION N/A 04/12/2021    Procedure: CORONARY STENT INTERVENTION;  Surgeon: Nelva Bush, MD;  Location: [facility];  Service: Cardiovascular;  Laterality: N/A;   CORONARY/GRAFT ACUTE MI REVASCULARIZATION N/A 10/04/2020    Procedure: Coronary/Graft Acute MI Revascularization;  Surgeon: Nelva Bush, MD;  Location: [facility];  Service: Cardiovascular;  Laterality: N/A;   HERNIA REPAIR       INTRAVASCULAR PRESSURE WIRE/FFR STUDY N/A 04/12/2021    Procedure: INTRAVASCULAR PRESSURE WIRE/FFR STUDY;  Surgeon: Nelva Bush, MD;  Location: [facility];  Service: Cardiovascular;  Laterality:  N/A;   INTRAVASCULAR ULTRASOUND/IVUS N/A 04/12/2021    Procedure: Intravascular Ultrasound/IVUS;  Surgeon: Nelva Bush, MD;  Location: [facility];  Service: Cardiovascular;  Laterality: N/A;   LEFT HEART CATH AND CORONARY ANGIOGRAPHY N/A 10/04/2020    Procedure: LEFT HEART CATH AND CORONARY ANGIOGRAPHY;  Surgeon: Nelva Bush, MD;  Location: [facility];  Service: Cardiovascular;  Laterality: N/A;   RIGHT/LEFT HEART CATH AND CORONARY ANGIOGRAPHY N/A 04/12/2021    Procedure: RIGHT/LEFT HEART CATH AND CORONARY ANGIOGRAPHY;  Surgeon: Nelva Bush, MD;  Location: [facility];  Service: Cardiovascular;  Laterality: N/A;      Assessment & Plan Clinical Impression:  Fredderick Swanger is a 76 year old male who was leaving a friend's home on the evening of 01/21/2022 when he missed stepped fell backward and struck the back of his head.  He complained of right upper and right lower extremity paralysis.  He was brought to the emergency department where CT of the head revealed left parafalcine meningioma with associated vasogenic edema in the left posterior frontal and parietal lobes.  Neurology and neurosurgery were consulted.  He reported he did not lose consciousness.  On examination his right upper extremity weakness was minimally improved and he could lift his right upper extremity off the bed but remained weak and clumsy.  He also had numbness in both right upper and right lower extremities.  He was started on Decadron and MRI of the brain and C-spine were performed.  His past medical history is significant for known meningioma status post gamma knife radiation to his brain lesion and reports he was to follow-up at [facility] next week.  He has remained alert and oriented.  His cervical spine MRI demonstrates mild bulging without significant neural compression.  Neurology has concluded his right-sided weakness favored to be multifactorial possibly secondary to new area of  edema of the left frontoparietal white matter on MRI and adjacent meningioma, possible brachial plexus injury secondary to his fall.  Possibility of cervical cord contusion secondary to fall.  He remains on steroids and both neurology and neurosurgery have signed off.  Cardiology was consulted due to possible atrial fibrillation, however EKG and telemetry both showed sinus rhythm with PACs. The patient requires inpatient medicine and rehabilitation evaluations and services for ongoing dysfunction secondary to left parafalcine meningioma with vasogenic edema. Patient transferred to CIR on 01/25/2022 .    Pt presents with decreased activity tolerance, decreased functional mobility, decreased balance, feelings of stress Limiting pt's independence with leisure/community pursuits.  Met with pt & wife today to discuss TR services including leisure education, activity analysis/modifications and stress management.  Also discussed the importance of social, emotional, spiritual health in  addition to physical health and their effects on overall health and wellness.  Pt stated understanding. Plan  Min 1 TR session per week during LOS  Recommendations for other services: None   Discharge Criteria: Patient will be discharged from TR if patient refuses treatment 3 consecutive times without medical reason.  If treatment goals not met, if there is a change in medical status, if patient makes no progress towards goals or if patient is discharged from hospital.  The above assessment, treatment plan, treatment alternatives and goals were discussed and mutually agreed upon: by patient  Hawkins 01/27/2022, 2:39 PM

## 2022-01-27 NOTE — Progress Notes (Deleted)
PROGRESS NOTE   Subjective/Complaints: No new complaints this AM. Reports he had a good day with therapy.     Review of Systems  Constitutional:  Negative for chills and fever.  Respiratory:  Negative for shortness of breath.   Cardiovascular:  Negative for chest pain.  Gastrointestinal:  Negative for abdominal pain, constipation, diarrhea, nausea and vomiting.  Neurological:  Positive for focal weakness.     Objective:   No results found. Recent Labs    01/26/22 0608  WBC 10.9*  HGB 9.5*  HCT 28.9*  PLT 243    Recent Labs    01/25/22 0310 01/26/22 0608  NA 137 139  K 4.6 4.8  CL 111 114*  CO2 18* 19*  GLUCOSE 151* 123*  BUN 64* 61*  CREATININE 2.37* 2.01*  CALCIUM 8.6* 8.9     Intake/Output Summary (Last 24 hours) at 01/27/2022 0907 Last data filed at 01/27/2022 0730 Gross per 24 hour  Intake 598 ml  Output 2150 ml  Net -1552 ml         Physical Exam: Vital Signs Blood pressure 128/84, pulse (!) 59, temperature 98.1 F (36.7 C), resp. rate 17, height '6\' 1"'$  (1.854 m), SpO2 95 %.   General: No apparent distress HEENT: Head is normocephalic, atraumatic, PERRLA, EOMI, sclera anicteric, oral mucosa pink and moist, wearing glasses Neck: Supple without JVD or lymphadenopathy Heart: Reg rate and rhythm. No murmurs rubs or gallops Chest: CTA bilaterally  Abdomen: Soft, non-tender, non-distended, bowel sounds positive. Extremities: No clubbing, cyanosis, or edema.  Psych: Pt's affect is appropriate. Pleasant Skin: Clean and intact without signs of breakdown Neuro:  Alert and oriented x 3. Normal language and speech. CN grossly intact. No sensory findings. LUE 5/5. LLE 4/5. RUE 2-3/5 prox to 4+/5 distal. RLE 4/5.  No obvious limb ataxia. Musculoskeletal: pt with pain to palpation near acromium. +Pain with shoulder IR/ER and impingement testing.  PIV LUE  Assessment/Plan: 1. Functional deficits which  require 3+ hours per day of interdisciplinary therapy in a comprehensive inpatient rehab setting. Physiatrist is providing close team supervision and 24 hour management of active medical problems listed below. Physiatrist and rehab team continue to assess barriers to discharge/monitor patient progress toward functional and medical goals  Care Tool:  Bathing    Body parts bathed by patient: Right arm, Right lower leg, Left arm, Left lower leg, Chest, Face, Abdomen, Front perineal area, Buttocks, Right upper leg, Left upper leg         Bathing assist Assist Level: Minimal Assistance - Patient > 75%     Upper Body Dressing/Undressing Upper body dressing   What is the patient wearing?: Pull over shirt    Upper body assist Assist Level: Supervision/Verbal cueing    Lower Body Dressing/Undressing Lower body dressing      What is the patient wearing?: Pants     Lower body assist Assist for lower body dressing: Minimal Assistance - Patient > 75%     Toileting Toileting    Toileting assist Assist for toileting: Minimal Assistance - Patient > 75%     Transfers Chair/bed transfer  Transfers assist     Chair/bed transfer assist level:  Minimal Assistance - Patient > 75%     Locomotion Ambulation   Ambulation assist      Assist level: Minimal Assistance - Patient > 75% Assistive device: Walker-rolling Max distance: 200'   Walk 10 feet activity   Assist     Assist level: Minimal Assistance - Patient > 75% Assistive device: Walker-rolling   Walk 50 feet activity   Assist    Assist level: Minimal Assistance - Patient > 75% Assistive device: Walker-rolling    Walk 150 feet activity   Assist    Assist level: Minimal Assistance - Patient > 75% Assistive device: Walker-rolling    Walk 10 feet on uneven surface  activity   Assist Walk 10 feet on uneven surfaces activity did not occur: Safety/medical concerns         Wheelchair     Assist  Is the patient using a wheelchair?: No             Wheelchair 50 feet with 2 turns activity    Assist            Wheelchair 150 feet activity     Assist          Blood pressure 128/84, pulse (!) 59, temperature 98.1 F (36.7 C), resp. rate 17, height '6\' 1"'$  (1.854 m), SpO2 95 %.  Medical Problem List and Plan: 1. Functional deficits secondary to fall, left parafalcine meningioma, right sided weakness.              -suspect right rotator cuff injury also. MRI was ordered             -patient may shower             -ELOS/Goals: 7-10 days, mod I goals             -Continue PT/OT 2.  Antithrombotics: -DVT/anticoagulation:  Mechanical:  Antiembolism stockings, knee (TED hose) Bilateral lower extremities             -antiplatelet therapy: aspirin only; Plavix discontinued 3. Pain Management: Tylenol, oxycodone as needed 4. Mood: LCSW to evaluate and provide emotional support             -antipsychotic agents: n/a 5. Neuropsych: This patient is capable of making decisions on his own behalf. 6. Skin/Wound Care: Routine skin care checks 7. Fluids/Electrolytes/Nutrition: Routine Is and Os and follow-up chemistries 8: Chronic systolic HF due to ischemic cardiomyopathy: EF 30-35%             --s/p biventricular ICD implantation [name] 10/2021             --continue Isordil 30 mg BID             -follow weights 9: CAD: acute STEMI 2022, s/p DES             --continuing aspirin; Plavix discontinued  -Denies any chest pain 10: Ascending aortic aneurysm: 4.2 cm on CTA chest 09/18/2020             --follow-up PCP/Dr. Roxan Hockey as outpatient 11: CKD 3b: baseline creatinine 2.2-2.4             --Cr = 2.37 5/16, Cr 2.01 on 7/17 stable, continue to follow 12: DM-2: no meds; trend glucoses (last A1c was 7.0 in 09/2020) 13: History of PE: remote (2011); no AC 14: Hypertension: continue Toprol XL 50 mg daily --Home hydralazine and ARB held -5/18 decrease metoprolol to 25,  continue to monitor 15: Hyperlipidemia: continue Lipitor 80  mg daily 16: Chronic urinary retention: indwelling Foley catheter since 09/2020             -continue with current mgt 17: Insomnia, chronic: continue melatonin 5 mg q HS 18: Anemia; mild, chronic  -5/17 Hemoglobin 9.5 continue to monitor 19: Leukocytosis: likely reactive to steroids; follow-up CBC with diff                         --5/17 improved to 10.9, follow 20. Bradycardia  -5/18 decrease metoprolol to '25mg'$  daily    LOS: 2 days A FACE TO FACE EVALUATION WAS PERFORMED  Jennye Boroughs 01/27/2022, 9:07 AM

## 2022-01-27 NOTE — Progress Notes (Signed)
Patient ID: Timothy Moran, male   DOB: [birth year], 76 y.o.   MRN: [id number]  SW made efforts to complete assessment, but pt and wife in session with RT. SW provided statement of service and will follow-up later.   Loralee Pacas, MSW, [place] Office: [phone] Cell: [phone] Fax: [phone]

## 2022-01-27 NOTE — Progress Notes (Signed)
PROGRESS NOTE                                                         PROGRESS NOTE     Subjective/Complaints: No new complaints this AM. Reports he had a good day with therapy.        Review of Systems  Constitutional:  Negative for chills and fever.  Respiratory:  Negative for shortness of breath.   Cardiovascular:  Negative for chest pain.  Gastrointestinal:  Negative for abdominal pain, constipation, diarrhea, nausea and vomiting.  Neurological:  Positive for focal weakness.      Objective:   Imaging Results (Last 48 hours)  No results found.   Recent Labs (last 2 labs)      Recent Labs    01/26/22 0608  WBC 10.9*  HGB 9.5*  HCT 28.9*  PLT 243       Recent Labs (last 2 labs)       Recent Labs    01/25/22 0310 01/26/22 0608  NA 137 139  K 4.6 4.8  CL 111 114*  CO2 18* 19*  GLUCOSE 151* 123*  BUN 64* 61*  CREATININE 2.37* 2.01*  CALCIUM 8.6* 8.9         Intake/Output Summary (Last 24 hours) at 01/27/2022 0907 Last data filed at 01/27/2022 0730    Gross per 24 hour  Intake 598 ml  Output 2150 ml  Net -1552 ml          Physical Exam: Vital Signs Blood pressure 128/84, pulse (!) 59, temperature 98.1 F (36.7 C), resp. rate 17, height '6\' 1"'$  (1.854 m), SpO2 95 %.     General: No apparent distress HEENT: Head is normocephalic, atraumatic, PERRLA, EOMI, sclera anicteric, oral mucosa pink and moist, wearing glasses Neck: Supple without JVD or lymphadenopathy Heart: Reg rate and rhythm. No murmurs rubs or gallops Chest: CTA bilaterally  Abdomen: Soft, non-tender, non-distended, bowel sounds positive. Extremities: No clubbing, cyanosis, or edema.  Psych: Pt's affect is appropriate. Pleasant Skin: Clean and intact without signs of breakdown Neuro:  Alert and oriented x 3. Normal language and speech. CN grossly intact. No sensory findings. LUE 5/5. LLE 4/5. RUE 2-3/5 prox to 4+/5 distal. RLE 4/5.   No obvious limb ataxia. Musculoskeletal: pt with pain to palpation near acromium. +Pain with shoulder IR/ER and impingement testing.  PIV LUE   Assessment/Plan: 1. Functional deficits which require 3+ hours per day of interdisciplinary therapy in a comprehensive inpatient rehab setting. Physiatrist is providing close team supervision and 24 hour management of active medical problems listed below. Physiatrist and rehab team continue to assess barriers to discharge/monitor patient progress toward functional and medical goals   Care Tool:   Bathing   Body parts bathed by patient: Right arm, Right lower leg, Left arm, Left lower leg, Chest, Face, Abdomen, Front perineal area, Buttocks, Right upper leg, Left upper leg       Bathing assist Assist Level: Minimal Assistance - Patient > 75%  Upper Body Dressing/Undressing Upper body dressing What is the patient wearing?: Pull over shirt   Upper body assist Assist Level: Supervision/Verbal cueing   Lower Body Dressing/Undressing Lower body dressing       What is the patient wearing?: Pants        Lower body assist Assist for lower body dressing: Minimal Assistance - Patient > 75%     Toileting Toileting   Toileting assist Assist for toileting: Minimal Assistance - Patient > 75%    Transfers Chair/bed transfer   Transfers assist     Chair/bed transfer assist level: Minimal Assistance - Patient > 75%    Locomotion Ambulation     Ambulation assist       Assist level: Minimal Assistance - Patient > 75% Assistive device: Walker-rolling Max distance: 200'    Walk 10 feet activity     Assist     Assist level: Minimal Assistance - Patient > 75% Assistive device: Walker-rolling    Walk 50 feet activity     Assist   Assist level: Minimal Assistance - Patient > 75% Assistive device: Walker-rolling      Walk 150 feet activity     Assist   Assist level: Minimal Assistance - Patient > 75% Assistive device:  Walker-rolling      Walk 10 feet on uneven surface  activity     Assist Walk 10 feet on uneven surfaces activity did not occur: Safety/medical concerns         Wheelchair         Assist Is the patient using a wheelchair?: No         Wheelchair 50 feet with 2 turns activity       Assist               Wheelchair 150 feet activity        Assist           Blood pressure 128/84, pulse (!) 59, temperature 98.1 F (36.7 C), resp. rate 17, height '6\' 1"'$  (1.854 m), SpO2 95 %.   Medical Problem List and Plan: 1. Functional deficits secondary to fall, left parafalcine meningioma, right sided weakness.              -suspect right rotator cuff injury also. MRI was ordered             -patient may shower             -ELOS/Goals: 7-10 days, mod I goals             -Continue PT/OT 2.  Antithrombotics: -DVT/anticoagulation:  Mechanical:  Antiembolism stockings, knee (TED hose) Bilateral lower extremities             -antiplatelet therapy: aspirin only; Plavix discontinued 3. Pain Management: Tylenol, oxycodone as needed 4. Mood: LCSW to evaluate and provide emotional support             -antipsychotic agents: n/a 5. Neuropsych: This patient is capable of making decisions on his own behalf. 6. Skin/Wound Care: Routine skin care checks 7. Fluids/Electrolytes/Nutrition: Routine Is and Os and follow-up chemistries 8: Chronic systolic HF due to ischemic cardiomyopathy: EF 30-35%             --s/p biventricular ICD implantation [name] 10/2021             --continue Isordil 30 mg BID             -follow weights 9: CAD: acute STEMI 2022, s/p  DES             --continuing aspirin; Plavix discontinued             -Denies any chest pain 10: Ascending aortic aneurysm: 4.2 cm on CTA chest 09/18/2020             --follow-up PCP/Dr. Roxan Hockey as outpatient 11: CKD 3b: baseline creatinine 2.2-2.4             --Cr = 2.37 5/16, Cr 2.01 on 7/17 stable, continue to follow 12:  DM-2: no meds; trend glucoses (last A1c was 7.0 in 09/2020) 13: History of PE: remote (2011); no AC 14: Hypertension: continue Toprol XL 50 mg daily --Home hydralazine and ARB held -5/18 decrease metoprolol to 25, continue to monitor 15: Hyperlipidemia: continue Lipitor 80 mg daily 16: Chronic urinary retention: indwelling Foley catheter since 09/2020             -continue with current mgt 17: Insomnia, chronic: continue melatonin 5 mg q HS 18: Anemia; mild, chronic             -5/17 Hemoglobin 9.5 continue to monitor 19: Leukocytosis: likely reactive to steroids; follow-up CBC with diff                         --5/17 improved to 10.9, follow 20. Bradycardia             -5/18 decrease metoprolol to '25mg'$  daily     LOS: 2 days A FACE TO FACE EVALUATION WAS PERFORMED   Jennye Boroughs 01/27/2022, 9:07 AM

## 2022-01-28 ENCOUNTER — Inpatient Hospital Stay (HOSPITAL_COMMUNITY): Payer: Medicare Other

## 2022-01-28 DIAGNOSIS — R29898 Other symptoms and signs involving the musculoskeletal system: Secondary | ICD-10-CM | POA: Diagnosis not present

## 2022-01-28 DIAGNOSIS — I10 Essential (primary) hypertension: Secondary | ICD-10-CM | POA: Diagnosis not present

## 2022-01-28 DIAGNOSIS — R001 Bradycardia, unspecified: Secondary | ICD-10-CM | POA: Diagnosis not present

## 2022-01-28 DIAGNOSIS — D329 Benign neoplasm of meninges, unspecified: Secondary | ICD-10-CM | POA: Diagnosis not present

## 2022-01-28 DIAGNOSIS — M25511 Pain in right shoulder: Secondary | ICD-10-CM

## 2022-01-28 MED ORDER — FLUTICASONE PROPIONATE 50 MCG/ACT NA SUSP
1.0000 | Freq: Every day | NASAL | Status: DC
  Administered 2022-01-28 – 2022-01-29 (×2): 1 via NASAL
  Administered 2022-01-30: 2 via NASAL
  Administered 2022-01-31: 1 via NASAL
  Administered 2022-02-01: 2 via NASAL
  Filled 2022-01-28: qty 16

## 2022-01-28 NOTE — Progress Notes (Signed)
Patient ID: Timothy Moran, male   DOB: 10-19-1945, 76 y.o.   MRN: [id number]  SW made efforts to meet with pt to complete assessment but not in room as in therapy.   SW will continue to assess for d/c needs.   Loralee Pacas, MSW, [place] Office: [phone] Cell: [phone] Fax: [phone]

## 2022-01-28 NOTE — Progress Notes (Signed)
PROGRESS NOTE                                                         PROGRESS NOTE     Subjective/Complaints: Continues to have shoulder pain with movement. No other concerns.        Review of Systems  Constitutional:  Negative for chills and fever.  Respiratory:  Negative for cough and shortness of breath.   Cardiovascular:  Negative for chest pain.  Gastrointestinal:  Negative for abdominal pain, nausea and vomiting.     Objective:   Imaging Results (Last 48 hours)  No results found.   Recent Labs (last 2 labs)      Recent Labs    01/26/22 0608  WBC 10.9*  HGB 9.5*  HCT 28.9*  PLT 243       Recent Labs (last 2 labs)       Recent Labs    01/25/22 0310 01/26/22 0608  NA 137 139  K 4.6 4.8  CL 111 114*  CO2 18* 19*  GLUCOSE 151* 123*  BUN 64* 61*  CREATININE 2.37* 2.01*  CALCIUM 8.6* 8.9         Intake/Output Summary (Last 24 hours) at 01/27/2022 0907 Last data filed at 01/27/2022 0730    Gross per 24 hour  Intake 598 ml  Output 2150 ml  Net -1552 ml          Physical Exam: Vital Signs Blood pressure 128/84, pulse (!) 59, temperature 98.1 F (36.7 C), resp. rate 17, height '6\' 1"'$  (1.854 m), SpO2 95 %.     General: No apparent distress, in bed HEENT: Head is normocephalic, atraumatic, PERRLA, EOMI, sclera anicteric, oral mucosa pink and moist, wearing glasses Neck: Supple without JVD or lymphadenopathy Heart: Reg rate and rhythm. No murmurs rubs or gallops Chest: CTA bilaterally , normal effort Abdomen: Soft, non-tender, non-distended, bowel sounds positive. Extremities: No clubbing, cyanosis, or edema.  Psych: Pt's affect is appropriate. Pleasant Skin: Clean and intact without signs of breakdown Neuro:  Alert and oriented x 3. Normal language and speech. CN grossly intact. No sensory findings. LUE 5/5. LLE 4/5. RUE 2-3/5 prox to 4+/5 distal. RLE 4/5.  No obvious limb  ataxia. Musculoskeletal: pt with pain to palpation near acromium. +Pain with shoulder IR/ER and impingement testing.  PIV LUE   Assessment/Plan: 1. Functional deficits which require 3+ hours per day of interdisciplinary therapy in a comprehensive inpatient rehab setting. Physiatrist is providing close team supervision and 24 hour management of active medical problems listed below. Physiatrist and rehab team continue to assess barriers to discharge/monitor patient progress toward functional and medical goals   Care Tool:   Bathing   Body parts bathed by patient: Right arm, Right lower leg, Left arm, Left lower leg, Chest, Face, Abdomen, Front perineal area, Buttocks, Right upper leg, Left upper leg       Bathing assist Assist Level: Minimal Assistance - Patient > 75%    Upper Body Dressing/Undressing Upper  body dressing What is the patient wearing?: Pull over shirt   Upper body assist Assist Level: Supervision/Verbal cueing   Lower Body Dressing/Undressing Lower body dressing       What is the patient wearing?: Pants        Lower body assist Assist for lower body dressing: Minimal Assistance - Patient > 75%     Toileting Toileting   Toileting assist Assist for toileting: Minimal Assistance - Patient > 75%    Transfers Chair/bed transfer   Transfers assist     Chair/bed transfer assist level: Minimal Assistance - Patient > 75%    Locomotion Ambulation     Ambulation assist       Assist level: Minimal Assistance - Patient > 75% Assistive device: Walker-rolling Max distance: 200'    Walk 10 feet activity     Assist     Assist level: Minimal Assistance - Patient > 75% Assistive device: Walker-rolling    Walk 50 feet activity     Assist   Assist level: Minimal Assistance - Patient > 75% Assistive device: Walker-rolling      Walk 150 feet activity     Assist   Assist level: Minimal Assistance - Patient > 75% Assistive device: Walker-rolling       Walk 10 feet on uneven surface  activity     Assist Walk 10 feet on uneven surfaces activity did not occur: Safety/medical concerns         Wheelchair         Assist Is the patient using a wheelchair?: No         Wheelchair 50 feet with 2 turns activity       Assist               Wheelchair 150 feet activity        Assist           Blood pressure 128/84, pulse (!) 59, temperature 98.1 F (36.7 C), resp. rate 17, height '6\' 1"'$  (1.854 m), SpO2 95 %.   Medical Problem List and Plan: 1. Functional deficits secondary to fall, left parafalcine meningioma, right sided weakness.              -suspect right rotator cuff injury also. MRI reordered yesterday             -patient may shower             -ELOS/Goals: 7-10 days, mod I goals             -Continue PT/OT  -Ambulated 2 x 27f with RW and Min A  -Grounds pass today 2.  Antithrombotics: -DVT/anticoagulation:  Mechanical:  Antiembolism stockings, knee (TED hose) Bilateral lower extremities             -antiplatelet therapy: aspirin only; Plavix discontinued 3. Pain Management: Tylenol, oxycodone as needed  -Pain overall well controlled,continue  4. Mood: LCSW to evaluate and provide emotional support             -antipsychotic agents: n/a 5. Neuropsych: This patient is capable of making decisions on his own behalf. 6. Skin/Wound Care: Routine skin care checks 7. Fluids/Electrolytes/Nutrition: Routine Is and Os and follow-up chemistries 8: Chronic systolic HF due to ischemic cardiomyopathy: EF 30-35%             --s/p biventricular ICD implantation Dr. CCurt Bears2/2023             --continue Isordil 30 mg BID             -  follow weights 9: CAD: acute STEMI 2022, s/p DES             --continuing aspirin; Plavix discontinued 10: Ascending aortic aneurysm: 4.2 cm on CTA chest 09/18/2020             --follow-up PCP/Dr. Roxan Hockey as outpatient 11: CKD 3b: baseline creatinine 2.2-2.4             --Cr = 2.37  5/16, Cr 2.01 on 7/17 stable, continue to follow 12: DM-2: no meds; trend glucoses (last A1c was 7.0 in 09/2020) 13: History of PE: remote (2011); no AC 14: Hypertension: continue Toprol XL 50 mg daily --Home hydralazine and ARB held -5/18 decrease metoprolol to 25, continue to monitor -Well controlled, continue to monitor 15: Hyperlipidemia: continue Lipitor 80 mg daily 16: Chronic urinary retention: indwelling Foley catheter since 09/2020             -continue with current mgt 17: Insomnia, chronic: continue melatonin 5 mg q HS 18: Anemia; mild, chronic             -5/17 Hemoglobin 9.5 continue to monitor 19: Leukocytosis: likely reactive to steroids; follow-up CBC with diff                         --5/17 improved to 10.9, follow 20. Bradycardia             -5/18 decrease metoprolol to '25mg'$  daily   -HR in 50s, continue to monitor     LOS: 2 days A FACE TO FACE EVALUATION WAS PERFORMED   Timothy Moran 01/27/2022, 9:07 AM

## 2022-01-28 NOTE — IPOC Note (Signed)
Overall Plan of Care [facility]) Patient Details Name: Timothy Moran MRN: [id number] DOB: 07/29/1946  Admitting Diagnosis: Meningioma [facility])  Hospital Problems: Principal Problem:   Meningioma [facility]) Active Problems:   Right arm weakness   Anemia   Leukocytosis     Functional Problem List: Nursing Behavior, Bladder, Bowel, Edema, Endurance, Medication Management, Pain, Safety  PT Balance, Endurance, Pain, Safety  OT Balance, Safety, Motor, Pain, Perception, Endurance  SLP    TR         Basic ADL's: OT Bathing, Dressing, Toileting     Advanced  ADL's: OT       Transfers: PT Bed Mobility, Bed to Chair, Car, Furniture, Floor  OT Tub/Shower, Agricultural engineer: PT Ambulation, Emergency planning/management officer, Stairs     Additional Impairments: OT Fuctional Use of Upper Extremity  SLP        TR      Anticipated Outcomes Item Anticipated Outcome  Self Feeding no goal  Swallowing      Basic self-care  (S)  Toileting  (S)   Bathroom Transfers (S)  Bowel/Bladder  min assist  Transfers  Supervision  Locomotion  ambulatory level Supervision with LRAD  Communication     Cognition     Pain  <3  Safety/Judgment  min assist   Therapy Plan: PT Intensity: Minimum of 1-2 x/day ,45 to 90 minutes PT Frequency: 5 out of 7 days PT Duration Estimated Length of Stay: 10-12 days OT Intensity: Minimum of 1-2 x/day, 45 to 90 minutes OT Frequency: 5 out of 7 days OT Duration/Estimated Length of Stay: 10-12 days     Team Interventions: Nursing Interventions Patient/Family Education, Bladder Management, Bowel Management, Disease Management/Prevention, Pain Management, Medication Management, Discharge Planning  PT interventions Ambulation/gait training, Balance/vestibular training, Community reintegration, Discharge planning, DME/adaptive equipment instruction, Functional mobility training, Neuromuscular re-education, Pain management, Patient/family education, Stair training,  Therapeutic Activities, Therapeutic Exercise, UE/LE Strength taining/ROM, UE/LE Coordination activities  OT Interventions Balance/vestibular training, Discharge planning, Pain management, Self Care/advanced ADL retraining, Therapeutic Activities, UE/LE Coordination activities, Functional mobility training, Patient/family education, Therapeutic Exercise, UE/LE Strength taining/ROM, Community reintegration, DME/adaptive equipment instruction, Psychosocial support  SLP Interventions    TR Interventions    SW/CM Interventions Discharge Planning, Psychosocial Support, Patient/Family Education   Barriers to Discharge MD  Medical stability and Home enviroment access/loayout  Nursing Decreased caregiver support, Home environment access/layout, Incontinence, Lack of/limited family support 2 level, 7 steps, right rail. Bed/bath main level. Spouse to provide assist at discharge.  PT      OT      SLP      SW       Team Discharge Planning: Destination: PT-Home ,OT- Home , SLP-  Projected Follow-up: PT-Outpatient PT, OT-  Outpatient OT, SLP-  Projected Equipment Needs: PT-Rolling walker with 5" wheels, OT- To be determined, SLP-  Equipment Details: PT-per pt report he owns RW, OT-  Patient/family involved in discharge planning: PT- Patient, Family Midwife,  OT-Patient, Family member/caregiver, SLP-   MD ELOS: 7-10  Medical Rehab Prognosis:  Excellent Assessment: The patient has been admitted for CIR therapies with the diagnosis of left parafalcine meningioma, fall and right sided weakness. . The team will be addressing functional mobility, strength, stamina, balance, safety, adaptive techniques and equipment, self-care, bowel and bladder mgt, patient and caregiver education. Goals have been set at mod I . Anticipated discharge destination is home.        See Team Conference Notes for weekly updates to the plan  of care

## 2022-01-28 NOTE — Progress Notes (Signed)
Occupational Therapy Session Note  Patient Details  Name: Timothy Moran MRN: [id number] Date of Birth: 11/10/1945  Today's Date: 01/28/2022 OT Individual Time: 1000-1100 OT Individual Time Calculation (min): 60 min    Short Term Goals: Week 1:  OT Short Term Goal 1 (Week 1): Pt will complete grooming task in standing at the sink with (S) OT Short Term Goal 2 (Week 1): Pt will complete LB dressing with CGA OT Short Term Goal 3 (Week 1): Pt will complete toileting tasks with CGA  Skilled Therapeutic Interventions/Progress Updates:    Subjective: Pt states no pain at rest but significant pain in right shoulder with active movement.  (No number provided).  Nurse made aware who administered medication for pain at beginning of session. Pt also reports having difficulty holding eating utensils and writing with dominant hand with [facility] difficulties.    Objective:  Pt sitting in recliner, wife present.Assessed pts RUE strength and motor control:  Shoulder strength deferred due to signifcant pain. Elbow: 4+/5 flexion and extension Wrist: 4+/5 flexion and extension Thumb adduction: +Froment's sign (unable to find pinchometer for objective assessment of strength) Ring and Small adduction: +Wartenberg's Sign. Grip: Left: 76, 81 lbs ; Right: 61, 65 lbs Pt noted having difficulty achieving intrinsic plus position right digits compared to left and also + Bouvier's sign.  Instructed pt on strengthening exercises using medium resistance putty to right hand: 3x15 each:   Gross grip Finger adduction Thumb adduction Instrinsic plus putty digs using exercise splint (gave to pt to use in future)  Provided HEP handout to ensure good carryover.  Also provided pt with built up foam handle to apply to feeding utensils to increased functional use of dominant hand.  Call bell in reach, seat alarm on.      Assessment:  Pt exhibits weakness consistent with ulnar nerve impingement including weak grip, weak  lateral pinch, mild claw deformity in small finger, weak finger adduction and overall intrinsic musculature. Primary barriers today included significant pain in right shoulder with active movement.    Plan: Pt may benefit from heelbo to protect ulnar nerve at elbow.  Therapy Documentation Precautions:  Precautions Precautions: Fall Restrictions Weight Bearing Restrictions: No   Therapy/Group: Individual Therapy  Ezekiel Slocumb 01/28/2022, 1:20 PM

## 2022-01-28 NOTE — Progress Notes (Signed)
Recreational Therapy Session Note  Patient Details  Name: Timothy Moran MRN: [id number] Date of Birth: September 05, 1946 Today's Date: 01/28/2022  Pain: c/o R shoulder pain, and R hip pain with transitional movements, ok at rest, Staff aware.  Ice provided at end of session for hip per pt request. Skilled Therapeutic Interventions/Progress Updates: Session focused on activity tolerance, dynamic standing balance during co-treat with PT.  PT stood with UE support to kick a ball alternately with LEs with contact guard assist.  Transitioned to ball tap activity with and without RW with contact guard assist.  Pt did require verbal cues to straighten R knee with activity.  Pt ambulated with RW from the dayroom back to room with contact guard assist.  Therapy/Group: Co-Treatment Joesphine Schemm 01/28/2022, 12:24 PM

## 2022-01-28 NOTE — Progress Notes (Signed)
Physical Therapy Session Note  Patient Details  Name: Timothy Moran MRN: [id number] Date of Birth: Nov 08, 1945  Today's Date: 01/28/2022 PT Individual Time: 1306-1400 PT Individual Time Calculation (min): 54 min   Short Term Goals: Week 1:  PT Short Term Goal 1 (Week 1): Pt will perform bed mobility at Supervision level PT Short Term Goal 2 (Week 1): Pt will navigate stairs with min A and LRAD PT Short Term Goal 3 (Week 1): Pt will complete Berg Balance Test to determine fall risk   Skilled Therapeutic Interventions/Progress Updates:   Pt received sitting in recliner and agreeable to PT. Pt performed sit<>stand transfer with supervision assist from PT for safety and RW.    Gait training in hall with RW x 13ft, 162ft x2 and 144ft. Cues for posture and safery in turns, no LOB noted.   Standing tolerance/balance while engaged in gross functional movement with Wii sports standing on level surface. Pt able to tolerate standing for all 10 frames, bur required min assist due to mild R/posterior bias when fatigued.  To prevent fall.   Additional dynamic balance to perform lateral reach to the L to obtain giant conect 4 pieces and play 2 rounds of conncet 4 game. CGA for safety with RUE supported on RW throughout. Therapeutic rest break required between bouts.  Patient returned to room and performed stand pivot to recliner with RW and supervision assist. Pt left sitting in recliner with call bell in reach and all needs met.          Therapy Documentation Precautions:  Precautions Precautions: Fall Restrictions Weight Bearing Restrictions: No    Vital Signs: Therapy Vitals Temp: 98.5 F (36.9 C) Temp Source: Oral Pulse Rate: (!) 54 Resp: 18 BP: 127/77 Patient Position (if appropriate): Sitting Oxygen Therapy SpO2: 97 % O2 Device: Room Air Pain:  6/10 R hip/ soreness, pt reposition and ambulation increased   Therapy/Group: Individual Therapy  [name] 01/28/2022,  6:16 PM

## 2022-01-28 NOTE — Progress Notes (Signed)
Physical Therapy Session Note  Patient Details  Name: Timothy Moran MRN: [id number] Date of Birth: 11/21/1945  Today's Date: 01/28/2022 PT Individual Time: 4098-1191 and 4782-9562 PT Individual Time Calculation (min): 75 min and 30 min  Short Term Goals: Week 1:  PT Short Term Goal 1 (Week 1): Pt will perform bed mobility at Supervision level PT Short Term Goal 2 (Week 1): Pt will navigate stairs with min A and LRAD PT Short Term Goal 3 (Week 1): Pt will complete Berg Balance Test to determine fall risk  Skilled Therapeutic Interventions/Progress Updates: Pt presented in bed with nsg present agreeable to therapy. Pt c/o pain in shoulder but no pain intervention requested. Pt performed bed mobility with supervision and PTA threaded pants due to threading of foley bag. Pt performed Sit to stand from EOB with supervision and was able to don shirt in standing with CGA for balance. Pt ambulated to sink and performed oral hygiene in w/c mod I. Pt then ambulated to day room with CGA. Pt participated in obstacle course incorporating stepping over thresholds, weaving through cones, and stepping onto compliant surface. Pt also participated in seated therex including LAQ with 3lb weight and standing hip abd with 3lb weight 2 x 10. Pt then participated in standing dynamic balance activities with Lattie Haw, RT including kicking ball engaging in weight shifting with and without AD. Pt also participated in low ball taps without AD. During this activity pt required intermittent cues to maintain R TKE as would begin to show increased R knee flexion and R lateral lean. After seated rest pt ambulated back to room with RW and CGA overall. Pt returned to recliner at end of session and left with seat alarm on, call bell within reach and needs met.   Tx2: Pt presented in recliner with wife present agreeable to therapy. Pt states some soreness in R thigh and R shoulder. Pt ambulated to rehab gym with RW and CGA. Pt continues  to demonstrate improved step length and decreased antalgic gait with ambulation. Pt participated in side stepping and back wards walking in parallel bars 6 ft x 4 each. Pt required cues of intermittent UE support with both sidestepping and backward walking as well as PTA providing facilitation to decrease hip rotation when performing side stepping. Pt also participated in balance board for weight shifting R/L. Pt was able to decrease UE support with this activity. Pt then transported over to stairs and performed ascending/descending stairs x 12 with B rails and CGA nearing close S. Pt ambulated back to room in same manner as prior and returned to recliner. Pt left in recliner at end of session with seat alarm on, call bell within reach and needs met.      Therapy Documentation Precautions:  Precautions Precautions: Fall Restrictions Weight Bearing Restrictions: No General:   Vital Signs:  Pain: Pain Assessment Pain Scale: 0-10 Pain Score: 4  Pain Location: Shoulder Pain Orientation: Right Pain Descriptors / Indicators: Aching Pain Onset: With Activity Pain Intervention(s): Medication (See eMAR) Mobility:   Locomotion :    Trunk/Postural Assessment :    Balance:   Exercises:   Other Treatments:      Therapy/Group: Individual Therapy  Rishikesh Khachatryan 01/28/2022, 10:29 AM

## 2022-01-29 DIAGNOSIS — D329 Benign neoplasm of meninges, unspecified: Secondary | ICD-10-CM | POA: Diagnosis not present

## 2022-01-29 NOTE — Progress Notes (Signed)
PROGRESS NOTE                                                         PROGRESS NOTE     Subjective/Complaints: Continues to have left shoulder pain with movement. Pending MRI, need to wait until Monday, since patient has defibrillator.    Review of Systems  Constitutional:  Negative for chills and fever.  Respiratory:  Negative for cough and shortness of breath.   Cardiovascular:  Negative for chest pain.  Gastrointestinal:  Negative for abdominal pain, nausea and vomiting.  Genitourinary:  Negative for dysuria, frequency, hematuria and urgency.       Chronic Foley since 09/2020, followed by urology  Musculoskeletal:  Negative for joint pain.       Right shoulder pain with limited ROM for elevation and extension.  Neurological: Negative.   Endo/Heme/Allergies: Negative.   Psychiatric/Behavioral: Negative.       Objective:   Imaging Results (Last 48 hours)  No results found.   Recent Labs (last 2 labs)      Recent Labs    01/26/22 0608  WBC 10.9*  HGB 9.5*  HCT 28.9*  PLT 243       Recent Labs (last 2 labs)       Recent Labs    01/25/22 0310 01/26/22 0608  NA 137 139  K 4.6 4.8  CL 111 114*  CO2 18* 19*  GLUCOSE 151* 123*  BUN 64* 61*  CREATININE 2.37* 2.01*  CALCIUM 8.6* 8.9         Intake/Output Summary (Last 24 hours) at 01/27/2022 0907 Last data filed at 01/27/2022 0730    Gross per 24 hour  Intake 598 ml  Output 2150 ml  Net -1552 ml          Physical Exam: Vital Signs Blood pressure 128/84, pulse (!) 59, temperature 98.1 F (36.7 C), resp. rate 17, height $RemoveBe'6\' 1"'xNOpgEcHg$  (1.854 m), SpO2 95 %.     General: No apparent distress, in chair HEENT: Head is normocephalic, atraumatic, PERRLA, EOMI, sclera anicteric, oral mucosa pink and moist, wearing glasses Neck: Supple without JVD or lymphadenopathy Heart: Reg rate and rhythm. No murmurs rubs or gallops or additional heart sounds. Has implanted  defibrillator. Chest: CTA bilaterally, effort normal. Abdomen: Soft, non-tender, non-distended, bowel sounds positive. Extremities: No clubbing, cyanosis, or edema.  Psych: Pt's affect is appropriate. Pleasant Skin: Clean and intact without signs of breakdown Neuro:  Alert and oriented x 3. Normal language and speech. CN grossly intact. No sensory findings. LUE 5/5. LLE 4/5. RUE 2-3/5 prox to 4+/5 distal. RLE 4/5.  No obvious limb ataxia. Musculoskeletal: Pt to palpation near acromion. Unable to extend left arm in front of body across midline due to pain.  Unable to elevate lt arm to side above 60-70 degrees due to left shoulder pain. Empty can test negative.   Assessment/Plan: 1. Functional deficits which require 3+ hours per day of interdisciplinary therapy in a comprehensive inpatient rehab setting. Physiatrist  is providing close team supervision and 24 hour management of active medical problems listed below. Physiatrist and rehab team continue to assess barriers to discharge/monitor patient progress toward functional and medical goals   Care Tool:   Bathing   Body parts bathed by patient: Right arm, Right lower leg, Left arm, Left lower leg, Chest, Face, Abdomen, Front perineal area, Buttocks, Right upper leg, Left upper leg       Bathing assist Assist Level: Minimal Assistance - Patient > 75%    Upper Body Dressing/Undressing Upper body dressing What is the patient wearing?: Pull over shirt   Upper body assist Assist Level: Supervision/Verbal cueing   Lower Body Dressing/Undressing Lower body dressing       What is the patient wearing?: Pants        Lower body assist Assist for lower body dressing: Minimal Assistance - Patient > 75%     Toileting Toileting   Toileting assist Assist for toileting: Minimal Assistance - Patient > 75%    Transfers Chair/bed transfer   Transfers assist     Chair/bed transfer assist level: Minimal Assistance - Patient > 75%     Locomotion Ambulation     Ambulation assist       Assist level: Minimal Assistance - Patient > 75% Assistive device: Walker-rolling Max distance: 200'    Walk 10 feet activity     Assist     Assist level: Minimal Assistance - Patient > 75% Assistive device: Walker-rolling    Walk 50 feet activity     Assist   Assist level: Minimal Assistance - Patient > 75% Assistive device: Walker-rolling      Walk 150 feet activity     Assist   Assist level: Minimal Assistance - Patient > 75% Assistive device: Walker-rolling      Walk 10 feet on uneven surface  activity     Assist Walk 10 feet on uneven surfaces activity did not occur: Safety/medical concerns         Wheelchair         Assist Is the patient using a wheelchair?: No         Wheelchair 50 feet with 2 turns activity       Assist               Wheelchair 150 feet activity        Assist           Blood pressure 128/84, pulse (!) 59, temperature 98.1 F (36.7 C), resp. rate 17, height $RemoveBe'6\' 1"'qKzaqPmAq$  (1.854 m), SpO2 95 %.   Medical Problem List and Plan: 1. Functional deficits secondary to fall, left parafalcine meningioma, right sided weakness.              -suspect right rotator cuff injury also. MRI reordered yesterday             -patient may shower             -ELOS/Goals: 7-10 days, mod I goals             -Continue PT/OT  -Ambulated 2 x 268ft with RW and Min A  -Grounds pass today 2.  Antithrombotics: -DVT/anticoagulation:  Mechanical:  Antiembolism stockings, knee (TED hose) Bilateral lower extremities             -antiplatelet therapy: aspirin only; Plavix discontinued 3. Pain Management: Tylenol, oxycodone as needed  -Pain overall well controlled,continue  4. Mood: LCSW to evaluate and provide emotional support             -  antipsychotic agents: n/a 5. Neuropsych: This patient is capable of making decisions on his own behalf. 6. Skin/Wound Care: Routine skin care checks 7.  Fluids/Electrolytes/Nutrition: Routine Is and Os and follow-up chemistries 8: Chronic systolic HF due to ischemic cardiomyopathy: EF 30-35%             --s/p biventricular ICD implantation [name] 10/2021             --continue Isordil 30 mg BID             -follow weights 9: CAD: acute STEMI 2022, s/p DES             --continuing aspirin; Plavix discontinued 10: Ascending aortic aneurysm: 4.2 cm on CTA chest 09/18/2020             --follow-up PCP/Dr. Roxan Hockey as outpatient 11: CKD 3b: baseline creatinine 2.2-2.4             --Cr = 2.37 5/16, Cr 2.01 on 5/17 stable, continue to follow             -5/20 pending qMonday labs on 5/22 BMET    Component Value Date/Time   NA 139 01/26/2022 0608   NA 143 10/11/2021 0838   K 4.8 01/26/2022 0608   CL 114 (H) 01/26/2022 0608   CO2 19 (L) 01/26/2022 0608   GLUCOSE 123 (H) 01/26/2022 0608   BUN 61 (H) 01/26/2022 0608   BUN 25 10/11/2021 0838   CREATININE 2.01 (H) 01/26/2022 0608   CALCIUM 8.9 01/26/2022 0608   EGFR 34 (L) 10/11/2021 0838   GFRNONAA 33 (L) 01/26/2022 0962    12: DM-2: no meds; trend glucoses (last A1c was 7.0 in 09/2020) 13: History of PE: remote (2011); no AC 14: Hypertension: continue Toprol XL 50 mg daily --Home hydralazine and ARB held -5/18 decrease metoprolol to 25, continue to monitor -Well controlled, continue to monitor 5/20 SBP's in the 130's, continue to trend.     01/29/2022    9:00 AM 01/29/2022    4:56 AM 01/29/2022    4:55 AM  Vitals with BMI  Weight   229 lbs 12 oz  BMI   83.66  Systolic  294   Diastolic  83   Pulse 61 52     15: Hyperlipidemia: continue Lipitor 80 mg daily 16: Chronic urinary retention: indwelling Foley catheter since 09/2020             -continue with current mgt 5/20 No changes, outpatient f/u urology 17: Insomnia, chronic: continue melatonin 5 mg q HS 18: Anemia; mild, chronic             -5/17 Hemoglobin 9.5 continue to monitor              -5/20 trend with qMonday  labs 19: Leukocytosis: likely reactive to steroids; follow-up CBC with diff                         --5/17 improved to 10.9, follow 20. Bradycardia             -5/18 decrease metoprolol to $RemoveBefor'25mg'FZJUeepxdogU$  daily   -HR in 50s, continue to monitor            -5/20 HR still in the 50's asymptomatic, continue to trend.     LOS: 2 days A FACE TO FACE EVALUATION WAS PERFORMED   Jennye Boroughs 01/27/2022, 9:07 AM

## 2022-01-29 NOTE — Progress Notes (Signed)
Physical Therapy Session Note  Patient Details  Name: Timothy Moran MRN: [id number] Date of Birth: October 19, 1945  Today's Date: 01/29/2022 PT Individual Time: 1445-1530 PT Individual Time Calculation (min): 45 min   Short Term Goals: Week 1:  PT Short Term Goal 1 (Week 1): Pt will perform bed mobility at Supervision level PT Short Term Goal 2 (Week 1): Pt will navigate stairs with min A and LRAD PT Short Term Goal 3 (Week 1): Pt will complete Berg Balance Test to determine fall risk  Skilled Therapeutic Interventions/Progress Updates: Pt presented in recliner with wife present agreeable to therapy. Pt denies pain at rest, no shoulder pain with arm below 90 degrees of flexion and decreased pain per previous sessions with transitional movements in RLE. Pt donned shoes with minA for time management and ambulated to rehab gym with CGA and RW. Pt participated in standing balance activities including use of rebounder without AD forward and with L/R trunk rotation x 20 each. Pt then attempted to participate in Lego block activity while placing LLE on 4in step. At this time pt with increased difficulty sustaining full extension of RLE and maintaining appropriate weight shift when placing LLE on step. Pt was able to place LLE on step with HHA but increased difficulty when attempting to remove LLE off block. Activity therefore transitioned to performing Lego activity without AD on level tile while pt was able to perform with CGA and intermittent verbal cues for full extension of R knee. Pt then ambulated to day room and transitioned to [employer] Kinetron. Pt performed marching 3 cycles of 10 and 2 x 5 Sit to stand at 50cm/sec. Pt used mirror feedback when performing Sit to stand to maintain equal weight distribution. Pt noted to demonstrate increased DOE with this activity. Pt ambulated back to room with RW and CGA without rest break and transferred to w/c so that pt can go off unit with wife. Pt left in w/c with  current needs met.      Therapy Documentation Precautions:  Precautions Precautions: Fall Restrictions Weight Bearing Restrictions: No General:   Vital Signs:   Pain:   Mobility:   Locomotion :    Trunk/Postural Assessment :    Balance:   Exercises:   Other Treatments:      Therapy/Group: Individual Therapy  Zarya Lasseigne 01/29/2022, 3:43 PM

## 2022-01-30 NOTE — Progress Notes (Signed)
Physical Therapy Session Note  Patient Details  Name: Timothy Moran MRN: [id number] Date of Birth: [birth year]  Today's Date: 01/30/2022 PT Individual Time: 0800-0900 PT Individual Time Calculation (min): 60 min   Short Term Goals: Week 1:  PT Short Term Goal 1 (Week 1): Pt will perform bed mobility at Supervision level PT Short Term Goal 2 (Week 1): Pt will navigate stairs with min A and LRAD PT Short Term Goal 3 (Week 1): Pt will complete Berg Balance Test to determine fall risk  Skilled Therapeutic Interventions/Progress Updates:    Pt received seated in bed, agreeable to PT session. Pt reports ongoing pain in R shoulder and R hip at rest, not rated and declines intervention. Supine to sit with Supervision with HOB elevated and use of bedrail. Pt is min A to don pants and shoes while seated EOB. Sit to stand with Supervision to CGA to RW during session. Ambulation up to 200 ft with RW with close Supervision to CGA for balance. Standing cane step-overs with no AD and mod A for balance, increased time required for balance recovery with each step. Standing alt L/R 6" step-taps with progression to step-ups with RW and min A for balance. Progression to alt L/R 1" step-taps with no AD and min A for balance. Ascend/descend 12 x 6" stairs with R handrail and min A for balance, cues for gait sequencing. Pt has one instance of RLE buckling on stairs, needs min A to recover. Alt L/R 6" step-ups with B UE support, lateral step ups with BUE support on one handrail x 10 reps with LLE, x 4 reps with RLE due to ongoing pain and weakness in R limb. Pt left seated in recliner in room with needs in reach at end of session.  Therapy Documentation Precautions:  Precautions Precautions: Fall Restrictions Weight Bearing Restrictions: No       Therapy/Group: Individual Therapy   Excell Seltzer, PT, DPT, CSRS 01/30/2022, 11:47 AM

## 2022-01-30 NOTE — Progress Notes (Signed)
Occupational Therapy Session Note  Patient Details  Name: Timothy Moran MRN: [id number] Date of Birth: [birth year]  Today's Date: 01/30/2022 OT Individual Time: 1006-1103 OT Individual Time Calculation (min): 57 min    Short Term Goals: Week 1:  OT Short Term Goal 1 (Week 1): Pt will complete grooming task in standing at the sink with (S) OT Short Term Goal 2 (Week 1): Pt will complete LB dressing with CGA OT Short Term Goal 3 (Week 1): Pt will complete toileting tasks with CGA  Skilled Therapeutic Interventions/Progress Updates:  Pt greeted seated in recliner  agreeable to OT intervention.  Pt declined showering or changing clothes today.  Pt completed functional ambulation from recliner to sink with Rw and CGA to stand for grooming tasks with close supervision. Pt ambulated to gym with RW and CGA to complete below therapeutic activities to promote improved standing balance, LB strength/endurance RUE [place] and improved RUE coordination.  Utilized BITS to work on [employer] coordination where pt instructed to reach dynamically for dots on screen to assess RUE coordination vs LUE. Results are indicated below: RUE: 86.57% accuracy  2.06 sec reaction time  9 misses  LUE: 96.51% accuracy  1.43 sec reaction time 3 misses Pt also completed tracing therapeutic activity with RUE to facilitate improved digit isolation and proprioception in RUE. Pt completed  Tracing task with 100% accuracy with 3 shapes, including, a hexagon, moon and a cross.   Pt also completed below therapeutic activities to promote improved dynamic standing balance and RUE [place]: -Standing on airex cushion while pt was instructed to place tacks in outline of shape on cork board with no UE support, pt completed task with CGA. -Standing to place weighted clothespins on rod with RUE to promote improved [place] in RUE, pt completed task with CGA  -2x10 sit>stands with no UE support with CGA to increase LB strength and endurance Pt ambulated  back to room with RW and CGA.  pt left seated in recliner with all needs within reach.                       Therapy Documentation Precautions:  Precautions Precautions: Fall Restrictions Weight Bearing Restrictions: No  Pain: unrated pain reported in RUE, rest breaks and light stretching provided for pain mgmt.     Therapy/Group: Individual Therapy  Corinne Ports [facility] 01/30/2022, 12:08 PM

## 2022-01-30 NOTE — Progress Notes (Signed)
                                                       PROGRESS NOTE                                                         PROGRESS NOTE     Subjective/Complaints: Patient seen in chair after PT.  Continues to have left shoulder pain with movement. But no changes over past 24-46 hrs. Pending MRI, needs to wait until Monday, since patient has defibrillator. Said that PT is working him hard.    Review of Systems  Constitutional:  Negative for chills and fever.  Respiratory:  Negative for cough and shortness of breath.   Cardiovascular:  Negative for chest pain.  Gastrointestinal:  Negative for abdominal pain, nausea and vomiting.  Genitourinary:  Negative for dysuria, frequency, hematuria and urgency.       Chronic Foley since 09/2020, followed by urology  Musculoskeletal:  Negative for joint pain.       Right shoulder pain with limited ROM for elevation and extension.  Neurological: Negative.   Endo/Heme/Allergies: Negative.   Psychiatric/Behavioral: Negative.        Objective:   Imaging Results (Last 48 hours)  No results found.   Recent Labs (last 2 labs)      Recent Labs    01/26/22 0608  WBC 10.9*  HGB 9.5*  HCT 28.9*  PLT 243       Recent Labs (last 2 labs)       Recent Labs    01/25/22 0310 01/26/22 0608  NA 137 139  K 4.6 4.8  CL 111 114*  CO2 18* 19*  GLUCOSE 151* 123*  BUN 64* 61*  CREATININE 2.37* 2.01*  CALCIUM 8.6* 8.9         Intake/Output Summary (Last 24 hours) at 01/27/2022 0907 Last data filed at 01/27/2022 0730    Gross per 24 hour  Intake 598 ml  Output 2150 ml  Net -1552 ml          Physical Exam: Vital Signs Blood pressure 128/84, pulse (!) 59, temperature 98.1 F (36.7 C), resp. rate 17, height 6' 1" (1.854 m), SpO2 95 %.     General: No apparent distress, in chair HEENT: Head is normocephalic, atraumatic, PERRLA, EOMI, sclera anicteric, oral mucosa pink and moist, wearing glasses Neck: Supple without JVD or  lymphadenopathy Heart: Reg rate and rhythm. No murmurs rubs or gallops or additional heart sounds. Has implanted defibrillator. Chest: CTA bilaterally, effort normal. Abdomen: Soft, non-tender, non-distended, bowel sounds positive. Extremities: No clubbing, cyanosis, or edema.  Psych: Pt's affect is appropriate. Pleasant Skin: Clean and intact without signs of breakdown Neuro:  Alert and oriented x 3. Normal language and speech. CN grossly intact. No sensory findings. LUE 5/5. LLE 4/5. RUE 2-3/5 prox to 4+/5 distal. RLE 4/5.  No obvious limb ataxia. Musculoskeletal: Pt to palpation near acromion. Unable to extend left arm in front of body across midline due to pain.  Unable to elevate lt arm to side above 60-70 degrees due to left shoulder pain. Empty can test negative.    Physical exam unchanged since 01/29/22   Assessment/Plan: 1. Functional deficits which require 3+ hours per day of interdisciplinary therapy in a comprehensive inpatient rehab setting. Physiatrist is providing close team supervision and 24 hour management of active medical problems listed below. Physiatrist and rehab team continue to assess barriers to discharge/monitor patient progress toward functional and medical goals   Care Tool:   Bathing   Body parts bathed by patient: Right arm, Right lower leg, Left arm, Left lower leg, Chest, Face, Abdomen, Front perineal area, Buttocks, Right upper leg, Left upper leg       Bathing assist Assist Level: Minimal Assistance - Patient > 75%    Upper Body Dressing/Undressing Upper body dressing What is the patient wearing?: Pull over shirt   Upper body assist Assist Level: Supervision/Verbal cueing   Lower Body Dressing/Undressing Lower body dressing       What is the patient wearing?: Pants        Lower body assist Assist for lower body dressing: Minimal Assistance - Patient > 75%     Toileting Toileting   Toileting assist Assist for toileting: Minimal Assistance -  Patient > 75%    Transfers Chair/bed transfer   Transfers assist     Chair/bed transfer assist level: Minimal Assistance - Patient > 75%    Locomotion Ambulation     Ambulation assist       Assist level: Minimal Assistance - Patient > 75% Assistive device: Walker-rolling Max distance: 200'    Walk 10 feet activity     Assist     Assist level: Minimal Assistance - Patient > 75% Assistive device: Walker-rolling    Walk 50 feet activity     Assist   Assist level: Minimal Assistance - Patient > 75% Assistive device: Walker-rolling      Walk 150 feet activity     Assist   Assist level: Minimal Assistance - Patient > 75% Assistive device: Walker-rolling      Walk 10 feet on uneven surface  activity     Assist Walk 10 feet on uneven surfaces activity did not occur: Safety/medical concerns         Wheelchair         Assist Is the patient using a wheelchair?: No         Wheelchair 50 feet with 2 turns activity       Assist               Wheelchair 150 feet activity        Assist           Blood pressure 128/84, pulse (!) 59, temperature 98.1 F (36.7 C), resp. rate 17, height 6' 1" (1.854 m), SpO2 95 %.   Medical Problem List and Plan: 1. Functional deficits secondary to fall, left parafalcine meningioma, right sided weakness.              -suspect right rotator cuff injury also. MRI reordered yesterday             -patient may shower             -ELOS/Goals: 7-10 days, mod I goals             -Continue PT/OT  -Ambulated 2 x 200ft with RW and Min A  -[place] today 5/21 MRI shoulder for 5/22, had to wait till Monday due to pt having defibrillator 2.  Antithrombotics: -DVT/anticoagulation:  Mechanical:  Antiembolism stockings, knee (TED hose) Bilateral lower extremities             -  antiplatelet therapy: aspirin only; Plavix discontinued 3. Pain Management: Tylenol, oxycodone as needed  -Pain overall well  controlled,continue  4. Mood: LCSW to evaluate and provide emotional support             -antipsychotic agents: n/a 5. Neuropsych: This patient is capable of making decisions on his own behalf. 6. Skin/Wound Care: Routine skin care checks 7. Fluids/Electrolytes/Nutrition: Routine Is and Os and follow-up chemistries 8: Chronic systolic HF due to ischemic cardiomyopathy: EF 30-35%             --s/p biventricular ICD implantation [name] 10/2021             --continue Isordil 30 mg BID             -follow weights 9: CAD: acute STEMI 2022, s/p DES             --continuing aspirin; Plavix discontinued 10: Ascending aortic aneurysm: 4.2 cm on CTA chest 09/18/2020             --follow-up PCP/Dr. Roxan Hockey as outpatient 11: CKD 3b: baseline creatinine 2.2-2.4             --Cr = 2.37 5/16, Cr 2.01 on 5/17 stable, continue to follow             -5/21 pending qMonday labs on 5/22 BMET    Component Value Date/Time   NA 139 01/26/2022 0608   NA 143 10/11/2021 0838   K 4.8 01/26/2022 0608   CL 114 (H) 01/26/2022 0608   CO2 19 (L) 01/26/2022 0608   GLUCOSE 123 (H) 01/26/2022 0608   BUN 61 (H) 01/26/2022 0608   BUN 25 10/11/2021 0838   CREATININE 2.01 (H) 01/26/2022 0608   CALCIUM 8.9 01/26/2022 0608   EGFR 34 (L) 10/11/2021 0838   GFRNONAA 33 (L) 01/26/2022 6073    12: DM-2: no meds; trend glucoses (last A1c was 7.0 in 09/2020) 13: History of PE: remote (2011); no AC 14: Hypertension: continue Toprol XL 50 mg daily --Home hydralazine and ARB held -5/18 decrease metoprolol to 25, continue to monitor -Well controlled, continue to monitor 5/20 SBP's in the 130's, continue to trend. 5/21 SBP's 120's-130's, CTM.     01/30/2022    5:54 AM 01/29/2022    7:48 PM 01/29/2022    4:43 PM  Vitals with BMI  Systolic 710 626 948  Diastolic 81 95 82  Pulse 63 51 62    15: Hyperlipidemia: continue Lipitor 80 mg daily 16: Chronic urinary retention: indwelling Foley catheter since 09/2020              -continue with current mgt 5/20 No changes, outpatient f/u urology 17: Insomnia, chronic: continue melatonin 5 mg q HS 18: Anemia; mild, chronic             -5/17 Hemoglobin 9.5 continue to monitor              -5/21 trend with qMonday labs 19: Leukocytosis: likely reactive to steroids; follow-up CBC with diff                         --5/17 improved to 10.9, follow 20. Bradycardia             -5/18 decrease metoprolol to 34m daily   -HR in 50s, continue to monitor            -5/20 HR still in the 50's asymptomatic, continue to trend.  5/21 HR 50's-60's, monitor TID     LOS: 2 days A FACE TO FACE EVALUATION WAS PERFORMED   Yuri Shtridelman 01/27/2022, 9:07 AM      

## 2022-01-30 NOTE — Progress Notes (Signed)
Physical Therapy Session Note  Patient Details  Name: Timothy Moran MRN: [id number] Date of Birth: 27-Jun-1946  Today's Date: 01/30/2022 PT Individual Time: 1415-1530 PT Individual Time Calculation (min): 75 min   Short Term Goals: Week 1:  PT Short Term Goal 1 (Week 1): Pt will perform bed mobility at Supervision level PT Short Term Goal 2 (Week 1): Pt will navigate stairs with min A and LRAD PT Short Term Goal 3 (Week 1): Pt will complete Berg Balance Test to determine fall risk  Skilled Therapeutic Interventions/Progress Updates:     Pt received supine in bed and agrees to therapy. No complaint of pain. Sit to stand with RW and CGA, with cues for initiation. Pt transfers to [address] [facility] with cues for hand placement. WC transport to gym for time management. Pt encouraged to attempt ambulation without AD to challenge balance. Pt initially requires minA/modA due to difficulty progressing R lower extremity and slight anterior LOB. Once gait initiated, however, pt only requires CGA and cues for upright gaze to improve posture and balance. 200' total. Following extended seated rest break, pt ambulates additional 300'. This time pt requires minA several instances due to increased postural sway and decreased stability, especially when trying to have conversation while ambulating. Pt balance improves when not multitasking.  Pt performs Berg balance test, scoring 26/56, improved from 13/56 on day of eval, through still indicating pt is at high risk for falls.   Pt performs NMR for balance and strength in parallel bars. Multiple reps of sit to tsand with cues for body mechanics. Pt performs 2x15 heel raises with emphasis on shifting weight into anterior portion of feet, then 1x15 toe raises while still trying to keep center of gravity anterior to hips.  Pt stands on platform with convex underside to promote dorsiflexion position of ankles in standing. Intended to force pt to shift COG anteriorly while also  providing somatosensory feedback when pt shift weight. Pt is able to complete with CGA and cues to utilize upright gaze for improving balance.  Pt ambulates x120' back to room with CGA and cues for upright gaze. Left seated with alarm intact and all needs with reach.  Therapy Documentation Precautions:  Precautions Precautions: Fall Restrictions Weight Bearing Restrictions: No   Therapy/Group: Individual Therapy  Breck Coons, PT, DPT 01/30/2022, 3:40 PM

## 2022-01-31 MED ORDER — SORBITOL 70 % SOLN
30.0000 mL | Freq: Once | Status: AC
  Administered 2022-01-31: 30 mL via ORAL
  Filled 2022-01-31: qty 30

## 2022-01-31 NOTE — Plan of Care (Signed)
  Problem: Consults Goal: RH GENERAL PATIENT EDUCATION Description: See Patient Education module for education specifics. Outcome: Progressing   Problem: RH BOWEL ELIMINATION Goal: RH STG MANAGE BOWEL WITH ASSISTANCE Description: STG Manage Bowel with Mod I Assistance. Outcome: Progressing Goal: RH STG MANAGE BOWEL W/MEDICATION W/ASSISTANCE Description: STG Manage Bowel with Medication with Mod I Assistance. Outcome: Progressing   Problem: RH BLADDER ELIMINATION Goal: RH STG MANAGE BLADDER WITH EQUIPMENT WITH ASSISTANCE Description: STG Manage Bladder With Chronic Foley With Mod I Assistance. Outcome: Progressing   Problem: RH SKIN INTEGRITY Goal: RH STG MAINTAIN SKIN INTEGRITY WITH ASSISTANCE Description: STG Maintain Skin Integrity With Mod I Assistance. Outcome: Progressing   Problem: RH SAFETY Goal: RH STG ADHERE TO SAFETY PRECAUTIONS W/ASSISTANCE/DEVICE Description: STG Adhere to Safety Precautions With Cues and Reminders. Outcome: Progressing Goal: RH STG DECREASED RISK OF FALL WITH ASSISTANCE Description: STG Decreased Risk of Fall With Mod I Assistance. Outcome: Progressing   Problem: RH PAIN MANAGEMENT Goal: RH STG PAIN MANAGED AT OR BELOW PT'S PAIN GOAL Description: < 3 on a 0-10 pain scale. Outcome: Progressing   Problem: RH KNOWLEDGE DEFICIT GENERAL Goal: RH STG INCREASE KNOWLEDGE OF SELF CARE AFTER HOSPITALIZATION Description: Patient will demonstrate knowledge of self-care management, bowel/bladder management, medication/pain management with educational materials and handouts provided by staff independently at discharge. Outcome: Progressing

## 2022-01-31 NOTE — Progress Notes (Signed)
Physical Therapy Session Note  Patient Details  Name: Timothy Moran MRN: [id number] Date of Birth: 26-Dec-1945  Today's Date: 01/31/2022 PT Individual Time: 0918-1000; 1300-1415 PT Individual Time Calculation (min): 42 min and 75 min  Short Term Goals: Week 1:  PT Short Term Goal 1 (Week 1): Pt will perform bed mobility at Supervision level PT Short Term Goal 2 (Week 1): Pt will navigate stairs with min A and LRAD PT Short Term Goal 3 (Week 1): Pt will complete Berg Balance Test to determine fall risk  Skilled Therapeutic Interventions/Progress Updates:    Session 1: Pt received seated in recliner in room, agreeable to PT session. Pt reports some soreness in his R shoulder with mobility, no pain at rest. Utilized rest breaks and repositioning as needed for pain management during session. Sit to stand with Supervision with and without AD during session. Ambulation x 200 ft with no AD and min A for balance, occasional ataxia and path deviation noted as compared to gait with AD. Pt has occasional R knee instability that increases with onset of fatigue, no buckling or LOB noted. Ambulation weaving through cones with no AD and min A for balance, decreased gait speed noted with increased challenge. Backwards amb in // bars with CGA for balance 6 x 10 ft for hip strengthening and balance retraining, progression from BUE support to no UE support. Standing mini-squats 3 x 5 reps with BUE support in // bars and CGA for balance, mod verbal cueing for correct body positioning during exercise. Pt attempts to perform squats without UE support but exhibits increased lean to the R without UE support. Encouraged pt to continue to use UE support in order to maintain midline and good body positioning with exercise. Pt returned to room and left seated in recliner with needs in reach at end of session.  Session 2: Pt received seated in recliner in room, agreeable to PT session. Pt reports pain in R shoulder with  mobility, no pain at rest. Pt still waiting to be taken for MRI for R shoulder, nursing with no updates regarding time of scan. Sit to stand and transfers with and without RW at Supervision level during session. Pt taken outdoors for gait in more functional environment. Pt able to ambulate across uneven ground, up/down inclines, and in busy, distracting environment with RW with CGA and without RW with min A. Pt has occasional LOB when RW or foot catches on obstacle, able to correct balance with min A to prevent fall. Also discussed removal of area rugs as they are a tripping hazard in home environment. Pt also exhibits decreased stability of R knee with increase in fatigue, no buckling noted during session. Ascend/descend 12 x 6" stairs with R handrail and CGA for balance, step-to and step-through gait pattern. Pt continues to exhibit improved safety and control during stair navigation. Sit to/from supine on real bed in therapy apartment at Supervision level. Standing alt L/R 4" step-taps with no UE support and min A for balance, decreased control of RLE with onset of fatigue, 3 x 15 reps. Static standing ball toss with no UE support and Supervision with progression to ball bounce with Supervision, no LOB noted. Forward dribbling x 5 steps with 5 steps backwards (no dribbling) with min A for balance, 2 x 5 reps. Pt exhibits some ataxia of gait but no LOB noted during task. Pt returned to recliner in room at end of session, needs in reach, wife present.  Therapy Documentation Precautions:  Precautions  Precautions: Fall Restrictions Weight Bearing Restrictions: No      Therapy/Group: Individual Therapy   Excell Seltzer, PT, DPT, CSRS 01/31/2022, 12:17 PM

## 2022-01-31 NOTE — Progress Notes (Signed)
Occupational Therapy Session Note  Patient Details  Name: Timothy Moran MRN: [id number] Date of Birth: 1946/04/14  Today's Date: 01/31/2022 OT Individual Time: 7353-2992 OT Individual Time Calculation (min): 72 min    Short Term Goals: Week 1:  OT Short Term Goal 1 (Week 1): Pt will complete grooming task in standing at the sink with (S) OT Short Term Goal 2 (Week 1): Pt will complete LB dressing with CGA OT Short Term Goal 3 (Week 1): Pt will complete toileting tasks with CGA  Skilled Therapeutic Interventions/Progress Updates:    Pt resting in bed upon arrival. Pt declined bathing and changing clothing this morning. Pt donned pants with supervision and amb with RW to sink to complete grooming tasks standing at sink-supervision. Pt amb with RW to gym with supervision. Standing activity tossing ball agains mini trampoline on compliant and non compliant surface. Pt also completed task with 1kg ball on comliant surface ([employer]). All tasks with CGA. Pt amb with RW to day room and engaged in balance activitiy on Wii balance board-penguin-with/without RW. CG for all tasks. Pt returned to room and remained in recliner. All needs within reach. RN present.  Therapy Documentation Precautions:  Precautions Precautions: Fall Restrictions Weight Bearing Restrictions: No Pain: Pt c/o Rt shoulder pain (unrated) with weightbearing; repositioned and MD aware   Therapy/Group: Individual Therapy  Leroy Libman 01/31/2022, 8:14 AM

## 2022-01-31 NOTE — Discharge Instructions (Signed)
Inpatient Rehab Discharge Instructions  Timothy Moran Discharge date and time: No discharge date for patient encounter.   Activities/Precautions/ Functional Status: Activity: no lifting, driving, or strenuous exercise until cleared by MD Diet: Heart healthy, diabetic Wound Care: none needed Functional status:  ___ No restrictions     ___ Walk up steps independently ___ 24/7 supervision/assistance   ___ Walk up steps with assistance ___ Intermittent supervision/assistance  ___ Bathe/dress independently ___ Walk with walker     ___ Bathe/dress with assistance ___ Walk Independently    ___ Shower independently ___ Walk with assistance    ___ Shower with assistance ___ No alcohol     ___ Return to work/school ________  Special Instructions:  No driving, alcohol consumption or tobacco use.   My questions have been answered and I understand these instructions. I will adhere to these goals and the provided educational materials after my discharge from the hospital.  Patient/Caregiver Signature _______________________________ Date __________  Clinician Signature _______________________________________ Date __________  Please bring this form and your medication list with you to all your follow-up doctor's appointments.

## 2022-02-01 ENCOUNTER — Inpatient Hospital Stay (HOSPITAL_COMMUNITY): Payer: Medicare Other

## 2022-02-01 LAB — CBC WITH DIFFERENTIAL/PLATELET
Abs Immature Granulocytes: 0.3 10*3/uL — ABNORMAL HIGH (ref 0.00–0.07)
Basophils Absolute: 0 10*3/uL (ref 0.0–0.1)
Basophils Relative: 0 %
Eosinophils Absolute: 0 10*3/uL (ref 0.0–0.5)
Eosinophils Relative: 0 %
HCT: 30.2 % — ABNORMAL LOW (ref 39.0–52.0)
Hemoglobin: 9.7 g/dL — ABNORMAL LOW (ref 13.0–17.0)
Immature Granulocytes: 2 %
Lymphocytes Relative: 15 %
Lymphs Abs: 1.9 10*3/uL (ref 0.7–4.0)
MCH: 30.5 pg (ref 26.0–34.0)
MCHC: 32.1 g/dL (ref 30.0–36.0)
MCV: 95 fL (ref 80.0–100.0)
Monocytes Absolute: 0.8 10*3/uL (ref 0.1–1.0)
Monocytes Relative: 6 %
Neutro Abs: 9.7 10*3/uL — ABNORMAL HIGH (ref 1.7–7.7)
Neutrophils Relative %: 77 %
Platelets: 206 10*3/uL (ref 150–400)
RBC: 3.18 MIL/uL — ABNORMAL LOW (ref 4.22–5.81)
RDW: 13.8 % (ref 11.5–15.5)
WBC: 12.7 10*3/uL — ABNORMAL HIGH (ref 4.0–10.5)
nRBC: 0 % (ref 0.0–0.2)

## 2022-02-01 LAB — BASIC METABOLIC PANEL
Anion gap: 6 (ref 5–15)
BUN: 56 mg/dL — ABNORMAL HIGH (ref 8–23)
CO2: 20 mmol/L — ABNORMAL LOW (ref 22–32)
Calcium: 8.8 mg/dL — ABNORMAL LOW (ref 8.9–10.3)
Chloride: 113 mmol/L — ABNORMAL HIGH (ref 98–111)
Creatinine, Ser: 1.86 mg/dL — ABNORMAL HIGH (ref 0.61–1.24)
GFR, Estimated: 37 mL/min — ABNORMAL LOW (ref >60)
Glucose, Bld: 149 mg/dL — ABNORMAL HIGH (ref 70–99)
Potassium: 4.9 mmol/L (ref 3.5–5.1)
Sodium: 139 mmol/L (ref 135–145)

## 2022-02-01 LAB — GLUCOSE, CAPILLARY: Glucose-Capillary: 232 mg/dL — ABNORMAL HIGH (ref 70–99)

## 2022-02-01 NOTE — Patient Care Conference (Signed)
Inpatient RehabilitationTeam Conference and Plan of Care Update Date: 02/01/2022   Time: 11:00 AM    Patient Name: Timothy Moran      Medical Record Number: [id number]  Date of Birth: 08/27/1946 Sex: Male         Room/Bed: 4M01C/4M01C-01 Payor Info: Payor: MEDICARE / Plan: MEDICARE PART A AND B / Product Type: *No Product type* /    Admit Date/Time:  01/25/2022  3:48 PM  Primary Diagnosis:  Meningioma [facility])  Hospital Problems: Principal Problem:   Meningioma [facility]) Active Problems:   Right arm weakness   Anemia   Leukocytosis    Expected Discharge Date: Expected Discharge Date: 02/03/22  Team Members Present: Physician leading conference: Dr. [name] Social Worker Present: Loralee Pacas, [place] Nurse Present: Dorthula Nettles, RN PT Present: Leavy Cella, PT OT Present: Roanna Epley, [place], OT PPS Coordinator present : Gunnar Fusi, SLP     Current Status/Progress Goal Weekly Team Focus  Bowel/Bladder   cont of bowel, chronic foley  remain cont.  assess q shift and prn foley and peri care   Swallow/Nutrition/ Hydration             ADL's   BADLs-CGA for standing balance; functional transfers CGA/supervision  supervision overall  education, safety awareness, functional transfers   Mobility   Supervision bed mobility and transfers, Supervision to CGA gait with RW, min A gait with no AD up to 200 ft, CGA stairs with R handrail  Supervision overall  balance, LE NMR, funtional mobility and transfers   Communication             Safety/Cognition/ Behavioral Observations            Pain   no current complaints of pain  pain <3  assess q shift and prn   Skin   skin intact  no new breakdown  assess q shift and prn     Discharge Planning:  D/c to home with pt wife who will provide 24/7 care.   Team Discussion: MRI scheduled today on right shoulder. Chronic foley, follow-up with Urology s/p discharge. Cr. 2.37 at admission, 1.86 currently. WBC's 12.7  with s/s of UTI. Continent bowel, reports pain to right shoulder. Has good family support. Will set-up outpatient therapy.  Patient on target to meet rehab goals: yes, supervision goals. Currently on track for goals. Fine motor control improving, balance improving.  *See Care Plan and progress notes for long and short-term goals.   Revisions to Treatment Plan:  Finalizing discharge plans and medications.   Teaching Needs: Family education, medication/pain management, foley management, transfer/gait training, etc.   Current Barriers to Discharge: No current barriers.  Possible Resolutions to Barriers: All barriers addressed.     Medical Summary Current Status: continent bowel- has chronic foley; no pain except R shoulder pain- not taking meds- skin ok-  Barriers to Discharge: Weight bearing restrictions;Home enviroment access/layout;Neurogenic Bowel & Bladder;Medical stability;Other (comments)  Barriers to Discharge Comments: chronic foley- to be removed by urology in 2 weeks-pending R shoulder MRI=- to be done today Possible Resolutions to Barriers/Weekly Focus: is light CGA- goals supervision; at S to [place]- 5/25- d/c Thursday- wait for MRI results- to see plan   Continued Need for Acute Rehabilitation Level of Care: The patient requires daily medical management by a physician with specialized training in physical medicine and rehabilitation for the following reasons: Direction of a multidisciplinary physical rehabilitation program to maximize functional independence : Yes Medical management of patient stability for increased  activity during participation in an intensive rehabilitation regime.: Yes Analysis of laboratory values and/or radiology reports with any subsequent need for medication adjustment and/or medical intervention. : Yes   I attest that I was present, lead the team conference, and concur with the assessment and plan of the team.   Cristi Loron 02/01/2022, 4:42  PM

## 2022-02-01 NOTE — Progress Notes (Signed)
Patient to MRI from [facility] for right shoulder scan wo contrast. Patient has medtronic BIV-ICD. CLE sent. Orders received for DOO 100

## 2022-02-01 NOTE — Plan of Care (Signed)
  Problem: RH BLADDER ELIMINATION Goal: RH STG MANAGE BLADDER WITH EQUIPMENT WITH ASSISTANCE Description: STG Manage Bladder With Chronic Foley With Mod I Assistance. Outcome: Not Progressing; foley cath

## 2022-02-01 NOTE — Progress Notes (Signed)
Occupational Therapy Session Note  Patient Details  Name: Timothy Moran MRN: [id number] Date of Birth: 1946/08/16  Today's Date: 02/01/2022 OT Individual Time: 0700-0810 OT Individual Time Calculation (min): 70 min    Short Term Goals: Week 1:  OT Short Term Goal 1 (Week 1): Pt will complete grooming task in standing at the sink with (S) OT Short Term Goal 2 (Week 1): Pt will complete LB dressing with CGA OT Short Term Goal 3 (Week 1): Pt will complete toileting tasks with CGA  Skilled Therapeutic Interventions/Progress Updates:    Pt resting in bed upon arrival and ready to get OOB. Pt donned pants and shoes without assistance with some challenges tieing shoelaces. Pt completed grooming tasks standing at sink before amb with RW to gym at supervision level. Standing tasks on AirEx using LUE to reach across midline and retrieve clothes pins and fastening on basketball net. Pt retrieved clothes pins using same method. Pt required min A/CGA without use of RW. LOBx1 with min A to correct. Table tasks for [facility] activities/testing:  9 hole peg: L-26.4 secs, R-32.4 secs  Box block test: L-53, R-41  FMC tasks with coins, tokens, nuts and bolts. Educated on importance of continuing to use and challenge RUE to improve function.  Pt amb with RW back to room and retruned to recliner. Pt remained in recliner with all needs within reach.   Therapy Documentation Precautions:  Precautions Precautions: Fall Restrictions Weight Bearing Restrictions: No Pain:  Pt reports ongoing Rt shoulder discomfort with ROM>90; repositioning (awaiting MRI)   Therapy/Group: Individual Therapy  Leroy Libman 02/01/2022, 8:10 AM

## 2022-02-01 NOTE — Progress Notes (Signed)
Occupational Therapy Note  Patient Details  Name: Timothy Moran MRN: [id number] Date of Birth: 13-Apr-1946  Today's Date: 02/01/2022 OT Missed Time: 74 Minutes Missed Time Reason: CT/MRI  Pt off unit for MRI. Pt missed 60 mins skilled OT services. Will check back later as time allows.   Leotis Shames [facility] 02/01/2022, 1:50 PM

## 2022-02-01 NOTE — Progress Notes (Signed)
Occupational Therapy Session Note  Patient Details  Name: Timothy Moran MRN: [id number] Date of Birth: [birth year]  Today's Date: 02/01/2022 OT Individual Time: 0904-1000 OT Individual Time Calculation (min): 56 min    Short Term Goals: Week 1:  OT Short Term Goal 1 (Week 1): Pt will complete grooming task in standing at the sink with (S) OT Short Term Goal 2 (Week 1): Pt will complete LB dressing with CGA OT Short Term Goal 3 (Week 1): Pt will complete toileting tasks with CGA  Skilled Therapeutic Interventions/Progress Updates:  Pt greeted seated in recliner  agreeable to OT intervention. Session focus on functional mobility, standing tolerance/balance, and RUE FMC.  Pt completed functional ambulation to/from gym with Rw and CGA. Pt completed below therapeutic activities to facilitate improved standing tolerance/ balance and RUE [place].          - standing matching game with an emphasis on [facility] with RUE specifically in hand manipulation skills such as rotation -standing jenga with an emphasis on RUE proprioception/ [place] - in hand manipulation skills with deck of cards with an emphasis on rotation and translation  Pt reports dealing cards was the most challenging task, encouraged pt to work on task at home.  Pt also completed theraputty HEP with an emphasis on composite digit flexion/extension, wrist flexion/extension, supination/pronation. Issued pt written HEP to increase carryover.  Pt left seated in recliner with all needs within reach.   Therapy Documentation Precautions:  Precautions Precautions: Fall Restrictions Weight Bearing Restrictions: No  Pain: unrated pain reported in R shoulder, rest breaks provided as needed.   Therapy/Group: Individual Therapy  Corinne Ports [facility] 02/01/2022, 11:57 AM

## 2022-02-01 NOTE — Progress Notes (Signed)
Occupational Therapy Session Note  Patient Details  Name: Timothy Moran MRN: [id number] Date of Birth: 08/29/1946  Today's Date: 02/01/2022 OT Individual Time: 1030-1100 OT Individual Time Calculation (min): 30 min    Short Term Goals: Week 1:  OT Short Term Goal 1 (Week 1): Pt will complete grooming task in standing at the sink with (S) OT Short Term Goal 2 (Week 1): Pt will complete LB dressing with CGA OT Short Term Goal 3 (Week 1): Pt will complete toileting tasks with CGA  Skilled Therapeutic Interventions/Progress Updates:     Pt received in bed with no pain   Therapeutic activity Pt completes ambulation to/from dayrom >200 feet with supervision. Pt completes obstacle course 2x with no AD and S-MIN A for perturbations and LOB over obstacles with circumduction initially stepping over barrier, onto compliant surface and wedge. Pt completes standing balance task on complaint wedge reaching for items in PNF diagonals lower than 90* shoulder flexion and above knee height with up to MIN A for posterior LOB.   Pt left at end of session in bed with exit alarm on, call light in reach and all needs met   Therapy Documentation Precautions:  Precautions Precautions: Fall Restrictions Weight Bearing Restrictions: No General:    Therapy/Group: Individual Therapy  Tonny Branch 02/01/2022, 6:58 AM

## 2022-02-01 NOTE — Progress Notes (Addendum)
PROGRESS NOTE                                                         PROGRESS NOTE     Subjective/Complaints:  Didn't get shoulder MRI yesterday- calling about defibrillator- is MRI compatible, but needs Cards to help [place] with it prior to MRI.   Has chronic foley- to be removed when sees urology in 2 weeks.  LBM overnight- with sorbitol.    ROS:  Pt denies SOB, abd pain, CP, N/V/C/D, and vision changes   Objective:   Cr down to 1.86 from 2.01 and BUN 56- form 61   Intake/Output Summary (Last 24 hours) at 01/27/2022 0907 Last data filed at 01/27/2022 0730    Gross per 24 hour  Intake 598 ml  Output 2150 ml  Net -1552 ml          Physical Exam: Vital Signs Blood pressure 128/84, pulse (!) 59, temperature 98.1 F (36.7 C), resp. rate 17, height _0  (1.854 m), SpO2 95 %.      General: awake, alert, appropriate, sitting up in bedside chair; NAD HENT: conjugate gaze; oropharynx moist CV: regular rate- defibrillator in place; ; no JVD Pulmonary: CTA B/L; no W/R/R- good air movement GI: soft, NT, ND, (+)BS Psychiatric: appropriate Neurological: Ox3  Skin: Clean and intact without signs of breakdown Neuro:  Alert and oriented x 3. Normal language and speech. CN grossly intact. No sensory findings. LUE 5/5. LLE 4/5. RUE 2-3/5 prox to 4+/5 distal. RLE 4/5.  No obvious limb ataxia. Musculoskeletal: Pt to palpation near acromion. Unable to extend left arm in front of body across midline due to pain.  Unable to elevate lt arm to side above 60-70 degrees due to right shoulder pain. Empty can test (+) for impingement vs partial RTC tear     Assessment/Plan: 1. Functional deficits which require 3+ hours per day of interdisciplinary therapy in a comprehensive inpatient rehab setting. Physiatrist is providing close team supervision and 24 hour management of active medical problems listed below. Physiatrist and rehab team  continue to assess barriers to discharge/monitor patient progress toward functional and medical goals   Care Tool:   Bathing   Body parts bathed by patient: Right arm, Right lower leg, Left arm, Left lower leg, Chest, Face, Abdomen, Front perineal area, Buttocks, Right upper leg, Left upper leg       Bathing assist Assist Level: Minimal Assistance - Patient > 75%    Upper Body Dressing/Undressing Upper body dressing What is the patient wearing?: Pull over shirt   Upper body assist Assist Level: Supervision/Verbal cueing   Lower Body Dressing/Undressing Lower body dressing       What is the patient wearing?: Pants        Lower body assist Assist for lower body dressing: Minimal Assistance - Patient > 75%     Toileting Toileting   Toileting assist Assist for toileting: Minimal Assistance - Patient > 75%    Transfers Chair/bed transfer  Transfers assist     Chair/bed transfer assist level: Minimal Assistance - Patient > 75%    Locomotion Ambulation     Ambulation assist       Assist level: Minimal Assistance - Patient > 75% Assistive device: Walker-rolling Max distance: 200'    Walk 10 feet activity     Assist     Assist level: Minimal Assistance - Patient > 75% Assistive device: Walker-rolling    Walk 50 feet activity     Assist   Assist level: Minimal Assistance - Patient > 75% Assistive device: Walker-rolling      Walk 150 feet activity     Assist   Assist level: Minimal Assistance - Patient > 75% Assistive device: Walker-rolling      Walk 10 feet on uneven surface  activity     Assist Walk 10 feet on uneven surfaces activity did not occur: Safety/medical concerns         Wheelchair         Assist Is the patient using a wheelchair?: No         Wheelchair 50 feet with 2 turns activity       Assist               Wheelchair 150 feet activity        Assist           Blood pressure 128/84, pulse (!) 59,  temperature 98.1 F (36.7 C), resp. rate 17, height _0  (1.854 m), SpO2 95 %.   Medical Problem List and Plan: 1. Functional deficits secondary to fall, left parafalcine meningioma, right sided weakness.              -suspect right rotator cuff injury also. MRI reordered yesterday             -patient may shower             -ELOS/Goals: 7-10 days, mod I goals             -Continue PT/OT  -Ambulated 2 x 239f with RW and Min A  -[place] today 5/21 MRI shoulder for 5/22, had to wait till Monday due to pt having defibrillator  5/23- con't PT and OT- CIR-   Team conference today to determine length of stay- will try to get MRI today/tomorrow- have called Cards to help u[place]  2.  Antithrombotics: -DVT/anticoagulation:  Mechanical:  Antiembolism stockings, knee (TED hose) Bilateral lower extremities             -antiplatelet therapy: aspirin only; Plavix discontinued 3. Pain Management: Tylenol, oxycodone as needed  -Pain overall well controlled,continue  4. Mood: LCSW to evaluate and provide emotional support             -antipsychotic agents: n/a 5. Neuropsych: This patient is capable of making decisions on his own behalf. 6. Skin/Wound Care: Routine skin care checks 7. Fluids/Electrolytes/Nutrition: Routine Is and Os and follow-up chemistries 8: Chronic systolic HF due to ischemic cardiomyopathy: EF 30-35%             --s/p biventricular ICD implantation Dr. CCurt Bears2/2023             --continue Isordil 30 mg BID             -follow weights  5/23- will check daily weights 9: CAD: acute STEMI 2022, s/p DES             --continuing aspirin; Plavix discontinued 10: Ascending aortic  aneurysm: 4.2 cm on CTA chest 09/18/2020             --follow-up PCP/Dr. Roxan Hockey as outpatient 11: CKD 3b: baseline creatinine 2.2-2.4             --Cr = 2.37 5/16, Cr 2.01 on 5/17 stable, continue to follow             5/21- Cr down to 1.86 and Bun down to 56- con't to monitor    Component Value  Date/Time   NA 139 02/01/2022 0525   NA 143 10/11/2021 0838   K 4.9 02/01/2022 0525   CL 113 (H) 02/01/2022 0525   CO2 20 (L) 02/01/2022 0525   GLUCOSE 149 (H) 02/01/2022 0525   BUN 56 (H) 02/01/2022 0525   BUN 25 10/11/2021 0838   CREATININE 1.86 (H) 02/01/2022 0525   CALCIUM 8.8 (L) 02/01/2022 0525   EGFR 34 (L) 10/11/2021 0838   GFRNONAA 37 (L) 02/01/2022 0525    12: DM-2: no meds; trend glucoses (last A1c was 7.0 in 09/2020) 13: History of PE: remote (2011); no AC 14: Hypertension: continue Toprol XL 50 mg daily --Home hydralazine and ARB held  5/23- BP controlled- con't regimen    02/01/2022    6:04 AM 01/31/2022    7:47 PM 01/31/2022    3:13 PM  Vitals with BMI  Systolic 409 811 914  Diastolic 77 74 78  Pulse 95 57 64    15: Hyperlipidemia: continue Lipitor 80 mg daily 16: Chronic urinary retention: indwelling Foley catheter since 09/2020             -continue with current mgt 5/20 No changes, outpatient f/u urology  5/23- to remove in 2 weeks when follows up with urology-  17: Insomnia, chronic: continue melatonin 5 mg q HS 18: Anemia; mild, chronic             -5/17 Hemoglobin 9.5 continue to monitor              -5/21 trend with qMonday labs 19: Leukocytosis: likely reactive to steroids; follow-up CBC with diff                         --5/17 improved to 10.9, follow  5/23- WBC up to 12.7- was 12 2 weeks ago- will recheck tomorrow- is afebrile- no Sx's of illness- of note, still on decadron 2 mg q6 hours 20. Bradycardia             -5/18 decrease metoprolol to 83m daily   -HR in 50s, continue to monitor            -5/20 HR still in the 50's asymptomatic, continue to trend.             5/21 HR 50's-60's, monitor TID       I spent a total of 52   minutes on total care today- >50% coordination of care- due to d/w PA and Cards about MRI/defibrillator as well as team conference.     A FACE TO FACE EVALUATION WAS PERFORMED   YJennye Boroughs5/18/2023, 9:07 AM

## 2022-02-01 NOTE — Progress Notes (Signed)
Patient ID: Timothy Moran, male   DOB: 07-13-1946, 76 y.o.   MRN: [id number]   SW met with pt and pt wife in room to provide updates from team conference, d/c date 5/24, and d/c recs- outpatient PT/OT. Preferred outpatient location is [facility]. No other questions/concerns reported.  SW faxed outpatient PT/OT referral to [facility] ([phone]/f:[phone]).   Loralee Pacas, MSW, [place] Office: [phone] Cell: [phone] Fax: [phone]

## 2022-02-01 NOTE — Progress Notes (Signed)
Inpatient Rehabilitation Care Coordinator Assessment and Plan Patient Details  Name: Timothy Moran MRN: [id number] Date of Birth: [birth year]  Today's Date: 02/01/2022  Hospital Problems: Principal Problem:   Meningioma [facility]) Active Problems:   Right arm weakness   Anemia   Leukocytosis  Past Medical History:  Past Medical History:  Diagnosis Date   Acute blood loss anemia 10/05/2020   Acute respiratory failure ([place]), hypothermia therapy, vent - extubated 10/06/20    AKI (acute kidney injury) ([place]) 10/05/2020   Anoxic brain injury ([place]) 10/26/2020   Arrhythmia    Ascending aortic aneurysm ([place]) 01/29/2018   a. 2019 4.3 cm by echo; b. 02/2021 Echo: Ao root 26m.   Benign brain tumor ([place]    Benign neoplasm of brain ([place] 12/19/2017   Cardiac arrest with ventricular fibrillation ([place]    Chest pain 12/04/2017   Chronic kidney disease, stage III (moderate) ([place] 12/19/2017   CKD (chronic kidney disease), stage III - IV (HCC)    Coronary Artery Disease    a. 18/1856Inf STEMI complicated by cardiac arrest/PCI: LAD 50p/m, LCX 100d (2.5 x 30 Resolute Onyx DES); b. 04/2021 PCI: 04/2021 LM nl, LAD 50p/824m2.5x30 Onyx Frontier DES), LCX 25p, 5064mFR 0.98), 40d ISR (RFR 0.98), OM2 25, RCA mild diff dzs.   Diabetes mellitus, type 2 ([place]1/25/2022   10/06/20 A1C 7%   Dizziness 10/11/2019   Encounter for imaging study to confirm orogastric (OG) tube placement    Hematuria 10/26/2020   HFrEF (heart failure with reduced ejection fraction) ([place]  a. 11/2020 Echo: EF 25-30%; b. 02/2021 Echo: EF 25-30%, glob HK. Mild LVH. Nl RV fxn. Triv AI. Ao root 57m69m History of pulmonary embolus (PE)    HLD (hyperlipidemia) 10/26/2020   Hypertension    Iron deficiency anemia rec'd IV iron 10/26/2020   Ischemic cardiomyopathy    a. 11/2020 Echo: EF 25-30%; b. 02/2021 Echo: EF 25-30%, glob HK.   Left bundle branch block 12/19/2017   Meningioma (HCC)[place]/11/2019   Nonsustained ventricular tachycardia  (HCC)[place]/29/2021   Pain of left hip joint 05/29/2020   Personal history of pulmonary embolism 12/19/2017   Pneumonia of both lungs due to infectious organism    Pulmonary hypertension due to thromboembolism (HCC)[place]/28/2019   See echo 12/27/17 with nl PAS vs CTa chest 02/23/18    S/P angioplasty with stent 10/04/20 DES to LCX  10/26/2020   Sinus bradycardia 12/22/2017   SOB (shortness of breath)    Solitary pulmonary nodule on lung CT 03/08/2018   CT 12/04/17 1.0 x 0.8 x 0.7 cm nodular opacity in the RUL vs not seen 03/16/10 posterior segment of the right upper lobe. .SpiMarland Kitchenometry 03/08/2018    FEV1 3.86 (108%)  Ratio 96 s prior rx  - PET  03/13/18   Low grade c/w adenoca > rec T surgery eval     STEMI (ST elevation myocardial infarction) (HCC)[place]/23/2022   Urinary retention 10/26/2020   Vestibular schwannoma (HCC)[place]/11/2019   Past Surgical History:  Past Surgical History:  Procedure Laterality Date   APPENDECTOMY     BIV ICD INSERTION CRT-D N/A 11/03/2021   Procedure: BIV ICD INSERTION CRT-D;  Surgeon: CamnConstance Haw;  [facility];  Service: Cardiovascular;  Laterality: N/A;   CARDIAC CATHETERIZATION     CORONARY ANGIOPLASTY WITH STENT PLACEMENT Left 04/12/2021   LAD stent placement   CORONARY STENT INTERVENTION N/A 04/12/2021   Procedure: CORONARY STENT INTERVENTION;  Surgeon:  End, Harrell Gave, MD;  Location: [place] [facility];  Service: Cardiovascular;  Laterality: N/A;   CORONARY/GRAFT ACUTE MI REVASCULARIZATION N/A 10/04/2020   Procedure: Coronary/Graft Acute MI Revascularization;  Surgeon: Nelva Bush, MD;  Location: [place] CV LAB;  Service: Cardiovascular;  Laterality: N/A;   HERNIA REPAIR     INTRAVASCULAR PRESSURE WIRE/FFR STUDY N/A 04/12/2021   Procedure: INTRAVASCULAR PRESSURE WIRE/FFR STUDY;  Surgeon: Nelva Bush, MD;  Location: [place] CV LAB;  Service: Cardiovascular;  Laterality: N/A;   INTRAVASCULAR ULTRASOUND/IVUS N/A 04/12/2021    Procedure: Intravascular Ultrasound/IVUS;  Surgeon: Nelva Bush, MD;  Location: [place] [facility] LAB;  Service: Cardiovascular;  Laterality: N/A;   LEFT HEART CATH AND CORONARY ANGIOGRAPHY N/A 10/04/2020   Procedure: LEFT HEART CATH AND CORONARY ANGIOGRAPHY;  Surgeon: Nelva Bush, MD;  Location: [place] [facility] LAB;  Service: Cardiovascular;  Laterality: N/A;   RIGHT/LEFT HEART CATH AND CORONARY ANGIOGRAPHY N/A 04/12/2021   Procedure: RIGHT/LEFT HEART CATH AND CORONARY ANGIOGRAPHY;  Surgeon: Nelva Bush, MD;  Location: [place] [facility] LAB;  Service: Cardiovascular;  Laterality: N/A;   Social History:  reports that he quit smoking about 53 years ago. His smoking use included cigarettes. He has a 2.50 pack-year smoking history. He has never used smokeless tobacco. He reports current alcohol use. He reports that he does not use drugs.  Family / Support Systems Marital Status: Married How Long?: 36 years Patient Roles: Spouse, Parent Spouse/Significant Other: Delcie Roch (wife) [phone] Children: 1 adult dtr- Doris (lives in [place], [place]) Other Supports: none reported Anticipated Caregiver: Wife Ability/Limitations of Caregiver: No concerns reported Caregiver Availability: 24/7 Family Dynamics: Pt lives with his wife.  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 Education: college Field seismologist - How often do you need to have someone help you when you read instructions, pamphlets, or other written material from your doctor or pharmacy?: Never Writes: Yes Employment Status: Retired Date Retired/Disabled/Unemployed: 2014 Age Retired: 65 Public relations account executive Issues: Denies Guardian/Conservator: N/A   Abuse/Neglect Abuse/Neglect Assessment Can Be Completed: Yes Physical Abuse: Denies Verbal Abuse:  Denies Sexual Abuse: Denies Exploitation of patient/patient's resources: Denies Self-Neglect: Denies  Patient response to: Social Isolation - How often do you feel lonely or isolated from those around you?: Never  Emotional Status Pt's affect, behavior and adjustment status: Pt in good spirits at time of visit. Pt eager to go home. Recent Psychosocial Issues: Denies Psychiatric History: Denies Substance Abuse History: Admits to one weekly drink on Friday's only.  Patient / Family Perceptions, Expectations & Goals Pt/Family understanding of illness & functional limitations: Pt and wife have a general understanding of his care needs Premorbid pt/family roles/activities: Independent Anticipated changes in roles/activities/participation: Supervision with ADLs/IADLs Pt/family expectations/goals: Pt goal is ultimately to get his R shoulder moving better.  Community Resources Express Scripts: None Premorbid Home Care/DME Agencies: None Transportation available at discharge: Wife Is the patient able to respond to transportation needs?: Yes In the past 12 months, has lack of transportation kept you from medical appointments or from getting medications?: No In the past 12 months, has lack of transportation kept you from meetings, work, or from getting things needed for daily living?: No  Discharge Planning Living Arrangements: Spouse/significant other Support Systems: Spouse/significant other Type of Residence: Private residence Insurance underwriter Resources: [employer], Multimedia programmer (specify) ([employer] supp) Museum/gallery curator Resources: Fish farm manager,  Other (Comment) (retirement/savings) Financial Screen Referred: No Living Expenses: Own Money Management: Patient Does the patient have any problems obtaining your medications?: No Home Management: Both manage home care needs. Patient/Family Preliminary Plans: No changes Care Coordinator Barriers to Discharge: Decreased caregiver support,  Lack of/limited family support Care Coordinator Anticipated Follow Up Needs: HH/OP Expected length of stay: D/c on 5/24  Clinical Impression Pt is not a veteran. DME: RW, can, 3in1 BSC.   Pt very pleasant and eager to go home. Pt will have support from his wife at d/c.  Roquel Burgin A Jalise Zawistowski 02/01/2022, 2:20 PM

## 2022-02-02 ENCOUNTER — Ambulatory Visit: Payer: Medicare Other | Admitting: Cardiology

## 2022-02-02 LAB — URINALYSIS, ROUTINE W REFLEX MICROSCOPIC
Bilirubin Urine: NEGATIVE
Glucose, UA: NEGATIVE mg/dL
Ketones, ur: NEGATIVE mg/dL
Nitrite: NEGATIVE
Protein, ur: 100 mg/dL — AB
RBC / HPF: >50 RBC/hpf — ABNORMAL HIGH (ref 0–5)
Specific Gravity, Urine: 1.019 (ref 1.005–1.030)
WBC, UA: >50 WBC/hpf — ABNORMAL HIGH (ref 0–5)
pH: 5 (ref 5.0–8.0)

## 2022-02-02 LAB — CBC WITH DIFFERENTIAL/PLATELET
Abs Immature Granulocytes: 0.29 10*3/uL — ABNORMAL HIGH (ref 0.00–0.07)
Basophils Absolute: 0 10*3/uL (ref 0.0–0.1)
Basophils Relative: 0 %
Eosinophils Absolute: 0 10*3/uL (ref 0.0–0.5)
Eosinophils Relative: 0 %
HCT: 30.9 % — ABNORMAL LOW (ref 39.0–52.0)
Hemoglobin: 10.2 g/dL — ABNORMAL LOW (ref 13.0–17.0)
Immature Granulocytes: 2 %
Lymphocytes Relative: 14 %
Lymphs Abs: 1.9 10*3/uL (ref 0.7–4.0)
MCH: 30.7 pg (ref 26.0–34.0)
MCHC: 33 g/dL (ref 30.0–36.0)
MCV: 93.1 fL (ref 80.0–100.0)
Monocytes Absolute: 0.9 10*3/uL (ref 0.1–1.0)
Monocytes Relative: 6 %
Neutro Abs: 10.9 10*3/uL — ABNORMAL HIGH (ref 1.7–7.7)
Neutrophils Relative %: 78 %
Platelets: 208 10*3/uL (ref 150–400)
RBC: 3.32 MIL/uL — ABNORMAL LOW (ref 4.22–5.81)
RDW: 13.9 % (ref 11.5–15.5)
WBC: 14 10*3/uL — ABNORMAL HIGH (ref 4.0–10.5)
nRBC: 0 % (ref 0.0–0.2)

## 2022-02-02 LAB — BASIC METABOLIC PANEL
Anion gap: 9 (ref 5–15)
BUN: 51 mg/dL — ABNORMAL HIGH (ref 8–23)
CO2: 17 mmol/L — ABNORMAL LOW (ref 22–32)
Calcium: 8.8 mg/dL — ABNORMAL LOW (ref 8.9–10.3)
Chloride: 113 mmol/L — ABNORMAL HIGH (ref 98–111)
Creatinine, Ser: 1.84 mg/dL — ABNORMAL HIGH (ref 0.61–1.24)
GFR, Estimated: 37 mL/min — ABNORMAL LOW (ref >60)
Glucose, Bld: 142 mg/dL — ABNORMAL HIGH (ref 70–99)
Potassium: 4.9 mmol/L (ref 3.5–5.1)
Sodium: 139 mmol/L (ref 135–145)

## 2022-02-02 MED ORDER — SENNOSIDES-DOCUSATE SODIUM 8.6-50 MG PO TABS: 1.0000 | ORAL_TABLET | Freq: Every evening | ORAL | Status: AC | PRN

## 2022-02-02 MED ORDER — CIPROFLOXACIN HCL 500 MG PO TABS
500.0000 mg | ORAL_TABLET | Freq: Two times a day (BID) | ORAL | Status: DC
  Administered 2022-02-02: 500 mg via ORAL
  Filled 2022-02-02: qty 1

## 2022-02-02 MED ORDER — FLUTICASONE PROPIONATE 50 MCG/ACT NA SUSP: 1.0000 | Freq: Every day | NASAL | 2 refills | Status: AC

## 2022-02-02 MED ORDER — DEXAMETHASONE 2 MG PO TABS: 2.0000 mg | ORAL_TABLET | Freq: Four times a day (QID) | ORAL | 0 refills | Status: AC

## 2022-02-02 MED ORDER — ACETAMINOPHEN 325 MG PO TABS: 325.0000 mg | ORAL_TABLET | ORAL | Status: AC | PRN

## 2022-02-02 MED ORDER — CIPROFLOXACIN HCL 500 MG PO TABS: 500.0000 mg | ORAL_TABLET | Freq: Two times a day (BID) | ORAL | 0 refills | Status: AC

## 2022-02-02 NOTE — Progress Notes (Signed)
INPATIENT REHABILITATION DISCHARGE NOTE   Discharge instructions IF:OYDXAJ PA  Verbalized understanding:yes  Skin care/Wound care healing?none  Pain:none  IV's:none  Tubes/Drains:pt to go home with foley changed to leg bag ; orders noted  O2:  Safety instructions:done  Patient belongings:done  Discharged OI:NOMV  Discharged EHM:[id number]  Regie Bunner RNC,BSN, WTA ; RN3  Notes:

## 2022-02-02 NOTE — Progress Notes (Signed)
Occupational Therapy Session Note  Patient Details  Name: Timothy Moran MRN: [id number] Date of Birth: June 15, 1946  Today's Date: 02/02/2022 OT Individual Time: 0700-0810 OT Individual Time Calculation (min): 70 min    Short Term Goals: Week 1:  OT Short Term Goal 1 (Week 1): Pt will complete grooming task in standing at the sink with (S) OT Short Term Goal 2 (Week 1): Pt will complete LB dressing with CGA OT Short Term Goal 3 (Week 1): Pt will complete toileting tasks with CGA  Skilled Therapeutic Interventions/Progress Updates:    Pt resting in bed upon arrival. Pt had been informed (incorrectly) that his d/c was 5/24 vs 5/25 as discussed in patient conference on 5/23. Discussion with MD confirmed that d/c scheduled for 5/25. Following discussing with MD and pt, d/c set for 5/24 after therapy. Pt completed all dressing and grooming tasks with supervision/mod I. Pt amb with RW to day room and completed 7 mins on NuStep (BLE only) at level 5. Pt engaged in standing balance tasks bouncing ball on floor with CGA/min A. Occasional LOB posteriorly requiring assistance. Pt aware that he places more weight on heels and consciously makes effort to correct. Pt verbalized understanding of recommendation to use RW when walking at home/community and when standing without UE support otherwise available. Reviewed RUE FMC/GMC activities. Pt returned to room and remained in recliner with all needs within reach.   Therapy Documentation Precautions:  Precautions Precautions: Fall Restrictions Weight Bearing Restrictions: No Pain:  Pt denies pain this morning but reports "stiffness" in RUE/shoulder; activity  Therapy/Group: Individual Therapy  Leroy Libman 02/02/2022, 8:11 AM

## 2022-02-02 NOTE — Progress Notes (Addendum)
PROGRESS NOTE                                                         PROGRESS NOTE     Subjective/Complaints:  Went over MRI of R shoulder-  Pt said had NO pain in R shoulder in spite of severe AC arthritis and moderate GH arthritis.  Also retraction of supraspinatus- unusual to occur in <1 month.   Wants to leave today.   Insists he leaves today- the board showed 5/24 as d/c date, not 5/25.     ROS:  Pt denies SOB, abd pain, CP, N/V/C/D, and vision changes   Objective:   Cr down to 1.86 from 2.01 and BUN 56- form 61   Intake/Output Summary (Last 24 hours) at 01/27/2022 0907 Last data filed at 01/27/2022 0730    Gross per 24 hour  Intake 598 ml  Output 2150 ml  Net -1552 ml          Physical Exam: Vital Signs Blood pressure 128/84, pulse (!) 59, temperature 98.1 F (36.7 C), resp. rate 17, height _0  (1.854 m), SpO2 95 %.       General: awake, alert, appropriate, sitting EOB; frustrated when heard was supposed to leave tomorrow, not today; OTA in room; NAD HENT: conjugate gaze; oropharynx moist CV: regular rate; no JVD Pulmonary: CTA B/L; no W/R/R- good air movement GI: soft, NT, ND, (+)BS Psychiatric: appropriate Neurological: Ox3  Skin: Clean and intact without signs of breakdown Neuro:  Alert and oriented x 3. Normal language and speech. CN grossly intact. No sensory findings. LUE 5/5. LLE 4/5. RUE 2-3/5 prox to 4+/5 distal. RLE 4/5.  No obvious limb ataxia. Musculoskeletal: Pt to palpation near acromion. Unable to extend left arm in front of body across midline due to pain.  Unable to elevate lt arm to side above 60-70 degrees due to right shoulder pain. Empty can test (+) for impingement vs partial RTC tear- actual has full thickness tear-      Assessment/Plan: 1. Functional deficits which require 3+ hours per day of interdisciplinary therapy in a comprehensive inpatient rehab  setting. Physiatrist is providing close team supervision and 24 hour management of active medical problems listed below. Physiatrist and rehab team continue to assess barriers to discharge/monitor patient progress toward functional and medical goals   Care Tool:   Bathing   Body parts bathed by patient: Right arm, Right lower leg, Left arm, Left lower leg, Chest, Face, Abdomen, Front perineal area, Buttocks, Right upper leg, Left upper leg       Bathing assist Assist Level: Minimal Assistance - Patient > 75%    Upper Body Dressing/Undressing Upper body dressing What is the patient wearing?: Pull over shirt   Upper body assist Assist Level: Supervision/Verbal cueing   Lower Body Dressing/Undressing Lower body dressing       What is the patient wearing?: Pants        Lower body assist Assist for lower body dressing: Minimal  Assistance - Patient > 75%     Child psychotherapist assist Assist for toileting: Minimal Assistance - Patient > 75%    Transfers Chair/bed transfer   Transfers assist     Chair/bed transfer assist level: Minimal Assistance - Patient > 75%    Locomotion Ambulation     Ambulation assist       Assist level: Minimal Assistance - Patient > 75% Assistive device: Walker-rolling Max distance: 200'    Walk 10 feet activity     Assist     Assist level: Minimal Assistance - Patient > 75% Assistive device: Walker-rolling    Walk 50 feet activity     Assist   Assist level: Minimal Assistance - Patient > 75% Assistive device: Walker-rolling      Walk 150 feet activity     Assist   Assist level: Minimal Assistance - Patient > 75% Assistive device: Walker-rolling      Walk 10 feet on uneven surface  activity     Assist Walk 10 feet on uneven surfaces activity did not occur: Safety/medical concerns         Wheelchair         Assist Is the patient using a wheelchair?: No         Wheelchair 50 feet with 2 turns  activity       Assist               Wheelchair 150 feet activity        Assist           Blood pressure 128/84, pulse (!) 59, temperature 98.1 F (36.7 C), resp. rate 17, height _0  (1.854 m), SpO2 95 %.   Medical Problem List and Plan: 1. Functional deficits secondary to fall, left parafalcine meningioma, right sided weakness.              -suspect right rotator cuff injury also. MRI reordered yesterday             -patient may shower             -ELOS/Goals: 7-10 days, mod I goals             -Continue PT/OT  -Ambulated 2 x 274f with RW and Min A  -[place] today 5/21 MRI shoulder for 5/22, had to wait till Monday due to pt having defibrillator  5/23- con't PT and OT- CIR-   5/24- d/c today after AM therapies- cannot hold d/c since already has all his equipment and only has AM therapies scheduled- also board showed d/c 5/24 even though was set for 5/25. Doesn't need f/u with me.   2.  Antithrombotics: -DVT/anticoagulation:  Mechanical:  Antiembolism stockings, knee (TED hose) Bilateral lower extremities             -antiplatelet therapy: aspirin only; Plavix discontinued 3. Pain Management: Tylenol, oxycodone as needed  -Pain overall well controlled,continue  5/24- mainly taking tylenol- no Oxy since 5/17- won't need pain meds at d/c.   4. Mood: LCSW to evaluate and provide emotional support             -antipsychotic agents: n/a 5. Neuropsych: This patient is capable of making decisions on his own behalf. 6. Skin/Wound Care: Routine skin care checks 7. Fluids/Electrolytes/Nutrition: Routine Is and Os and follow-up chemistries 8: Chronic systolic HF due to ischemic cardiomyopathy: EF 30-35%             --s/p biventricular ICD implantation  [name] 10/2021             --continue Isordil 30 mg BID             -follow weights  5/23- will check daily weights  5/24- weight stable-  9: CAD: acute STEMI 2022, s/p DES             --continuing aspirin; Plavix  discontinued 10: Ascending aortic aneurysm: 4.2 cm on CTA chest 09/18/2020             --follow-up PCP/Dr. Roxan Hockey as outpatient 11: CKD 3b: baseline creatinine 2.2-2.4             --Cr = 2.37 5/16, Cr 2.01 on 5/17 stable, continue to follow             5/21- Cr down to 1.86 and Bun down to 56- con't to monitor  5/24- Cr 1.84 and BUN 51-     Component Value Date/Time   NA 139 02/02/2022 0559   NA 143 10/11/2021 0838   K 4.9 02/02/2022 0559   CL 113 (H) 02/02/2022 0559   CO2 17 (L) 02/02/2022 0559   GLUCOSE 142 (H) 02/02/2022 0559   BUN 51 (H) 02/02/2022 0559   BUN 25 10/11/2021 0838   CREATININE 1.84 (H) 02/02/2022 0559   CALCIUM 8.8 (L) 02/02/2022 0559   EGFR 34 (L) 10/11/2021 0838   GFRNONAA 37 (L) 02/02/2022 0559    12: DM-2: no meds; trend glucoses (last A1c was 7.0 in 09/2020) 13: History of PE: remote (2011); no AC 14: Hypertension: continue Toprol XL 50 mg daily --Home hydralazine and ARB held  5/23- BP controlled- con't regimen    02/02/2022    5:44 AM 02/02/2022    5:00 AM 02/01/2022    7:35 PM  Vitals with BMI  Weight  229 lbs 4 oz   BMI  95.62   Systolic 130  865  Diastolic 75  73  Pulse 43  93    15: Hyperlipidemia: continue Lipitor 80 mg daily 16: Chronic urinary retention: indwelling Foley catheter since 09/2020             -continue with current mgt 5/20 No changes, outpatient f/u urology  5/23- to remove in 2 weeks when follows up with urology-  17: Insomnia, chronic: continue melatonin 5 mg q HS 18: Anemia; mild, chronic             -5/17 Hemoglobin 9.5 continue to monitor              -5/21 trend with qMonday labs 19: Leukocytosis: likely reactive to steroids; follow-up CBC with diff                         --5/17 improved to 10.9, follow  5/23- WBC up to 12.7- was 12 2 weeks ago- will recheck tomorrow- is afebrile- no Sx's of illness- of note, still on decadron 2 mg q6 hours  5/24- will need f/u with [facility] since still on steroids-  20. Bradycardia              -5/18 decrease metoprolol to 12m daily   -HR in 50s, continue to monitor            -5/20 HR still in the 50's asymptomatic, continue to trend.             5/21 HR 50's-60's, monitor TID  20. R shoulder full thickness RTC tear suprapsinauts; and  partial tear of Subscapularis and infraspinatus- only teres not torn- also has severe AC OA and moderate GH OA- will need referral to Ortho.  21. Leukocytosis- not a CAUTI because has Chronic foley from home- checking U/A and Cx- urine dark with sediment- called pharmacy since Cr 1.81- will do Cipro 500 mg BID x 14 doses- and wait for Urine Cx.      I spent a total of 34   minutes on total care today- >50% coordination of care- due to arranging d/c today vs tomorrow and going over MRI results- explained R shoulder will scar down, but won't heal normally without surgical intervention-     A FACE TO FACE EVALUATION WAS PERFORMED

## 2022-02-02 NOTE — Progress Notes (Signed)
Occupational Therapy Discharge Summary  Patient Details  Name: Timothy Moran MRN: [id number] Date of Birth: 03/28/1946  Patient has met 6 of 6 long term goals due to improved activity tolerance, improved balance, postural control, ability to compensate for deficits, and improved awareness.  Pt made steady progress with BADLs and functional transfers during this admission. Pt is supervision for all BADLs and transfers using RW. RUE [place] improving and pt independent with HEP. RUE/shoulder MRI revealed 3/4 rotator cuff muscles with slight tear. Pt verbalized understanding of recommendation to avoide lifting any objects with RUE >gallon of milk/water. Pt's wife has not been present for therapy sessions. Patient to discharge at overall Supervision level.  Patient's care partner is independent to provide the necessary physical assistance at discharge.    Reasons goals not met: n/a  Recommendation:  Patient will benefit from ongoing skilled OT services in home health setting to continue to advance functional skills in the area of BADL.  Equipment: No equipment provided  Reasons for discharge: treatment goals met and discharge from hospital  Patient/family agrees with progress made and goals achieved: Yes  OT Discharge ADL ADL Eating: Independent Where Assessed-Eating: Chair Grooming: Modified independent Where Assessed-Grooming: Standing at sink Upper Body Bathing: Independent Where Assessed-Upper Body Bathing: Edge of bed Lower Body Bathing: Supervision/safety Where Assessed-Lower Body Bathing: Edge of bed Upper Body Dressing: Independent Where Assessed-Upper Body Dressing: Edge of bed Lower Body Dressing: Supervision/safety Where Assessed-Lower Body Dressing: Edge of bed Toileting: Modified independent Where Assessed-Toileting: Glass blower/designer: Distant supervision Armed forces technical officer Method: Ambulating Tub/Shower Transfer: Unable to assess Tub/Shower Transfer Method: Unable to  assess Social research officer, government: Close supervision Social research officer, government Method: Ambulating Vision Baseline Vision/History: 1 Wears glasses Patient Visual Report: No change from baseline Vision Assessment?: No apparent visual deficits Perception  Perception: Within Functional Limits Praxis Praxis: Intact Cognition Cognition Overall Cognitive Status: Within Functional Limits for tasks assessed Arousal/Alertness: Awake/alert Orientation Level: Person;Place;Situation Person: Oriented Place: Oriented Situation: Oriented Memory: Appears intact Attention: Selective Focused Attention: Appears intact Sustained Attention: Appears intact Selective Attention: Appears intact Awareness: Appears intact Problem Solving: Appears intact Safety/Judgment: Appears intact Brief Interview for Mental Status (BIMS) Repetition of Three Words (First Attempt): 3 Temporal Orientation: Year: Correct Temporal Orientation: Month: Accurate within 5 days Temporal Orientation: Day: Correct Recall: "Sock": Yes, no cue required Recall: "Blue": Yes, no cue required Recall: "Bed": Yes, no cue required BIMS Summary Score: 15 Sensation Sensation Light Touch: Appears Intact Light Touch Impaired Details: Impaired RLE Hot/Cold: Appears Intact Proprioception: Appears Intact Motor  Motor Motor: Hemiplegia;Ataxia;Abnormal postural alignment and control Motor - Skilled Clinical Observations: R hemi with ataxia; R knee buckling with functional mobility   Trunk/Postural Assessment  Cervical Assessment Cervical Assessment: Exceptions to [facility] (limited) Thoracic Assessment Thoracic Assessment: Within Functional Limits Lumbar Assessment Lumbar Assessment: Within Functional Limits Postural Control Postural Control: Deficits on evaluation Righting Reactions: delayed  Balance Static Sitting Balance Static Sitting - Balance Support: Feet supported Static Sitting - Level of Assistance: 7: Independent Dynamic  Sitting Balance Dynamic Sitting - Balance Support: During functional activity Dynamic Sitting - Level of Assistance: 5: Stand by assistance Extremity/Trunk Assessment RUE Assessment RUE Assessment: Exceptions to [facility] Active Range of Motion (AROM) Comments: Full ROM limited by pain RUE Body System: Neuro Brunstrum levels for arm and hand: Arm;Hand Brunstrum level for arm: Stage V Relative Independence from Synergy Brunstrum level for hand: Stage VI Isolated joint movements LUE Assessment LUE Assessment: Within Functional Limits   Timothy Moran 02/02/2022, 7:00  AM

## 2022-02-02 NOTE — Progress Notes (Signed)
Physical Therapy Discharge Summary  Patient Details  Name: Timothy Moran MRN: [id number] Date of Birth: 1946-02-02  Today's Date: 02/02/2022 PT Individual Time: 0903-0957 PT Individual Time Calculation (min): 54 min    Patient has met 8 of 8 long term goals due to improved activity tolerance, improved balance, improved postural control, increased strength, decreased pain, improved attention, improved awareness, and improved coordination.  Patient to discharge at an ambulatory level Supervision with assistive device.   Patient's care partner is independent to provide the necessary physical assistance at discharge.  Reasons goals not met: N/A all goals met  Recommendation:  Patient will benefit from ongoing skilled PT services in {setting:3041680} to continue to advance safe functional mobility, address ongoing impairments in ***, and minimize fall risk.  Equipment: No equipment provided  Reasons for discharge: treatment goals met and discharge from hospital  Patient/family agrees with progress made and goals achieved: Yes  PT Discharge Precautions/Restrictions Precautions Precautions: Fall Restrictions Weight Bearing Restrictions: No Vital Signs  Pain Pain Assessment Pain Score: 0-No pain Pain Interference Pain Interference Pain Effect on Sleep: 1. Rarely or not at all Pain Interference with Therapy Activities: 1. Rarely or not at all Pain Interference with Day-to-Day Activities: 1. Rarely or not at all Vision/Perception  Vision - History Ability to See in Adequate Light: 0 Adequate Perception Perception: Within Functional Limits Praxis Praxis: Intact  Cognition Overall Cognitive Status: Within Functional Limits for tasks assessed Arousal/Alertness: Awake/alert Orientation Level: Oriented X4 Attention: Selective Focused Attention: Appears intact Sustained Attention: Appears intact Selective Attention: Appears intact Memory: Appears intact Awareness: Appears  intact Problem Solving: Appears intact Safety/Judgment: Appears intact Sensation Sensation Light Touch: Appears Intact Light Touch Impaired Details: Impaired RLE Hot/Cold: Appears Intact Proprioception: Appears Intact Coordination Gross Motor Movements are Fluid and Coordinated: Yes Fine Motor Movements are Fluid and Coordinated: No Motor  Motor Motor: Hemiplegia;Ataxia;Abnormal postural alignment and control Motor - Skilled Clinical Observations: R hemi with ataxia; R knee buckling with functional mobility Motor - Discharge Observations: Improved awareness of R knee buckling  Mobility Bed Mobility Bed Mobility: Supine to Sit;Sit to Supine Rolling Right: Independent Rolling Left: Independent Supine to Sit: Independent Sit to Supine: Independent Transfers Transfers: Sit to Stand;Stand to Sit;Stand Pivot Transfers Sit to Stand: Supervision/Verbal cueing Stand to Sit: Supervision/Verbal cueing Stand Pivot Transfers: Supervision/Verbal cueing Transfer (Assistive device): Rolling walker Locomotion  Gait Ambulation: Yes  Trunk/Postural Assessment  Cervical Assessment Cervical Assessment: Exceptions to [facility] Thoracic Assessment Thoracic Assessment: Within Functional Limits Lumbar Assessment Lumbar Assessment: Within Functional Limits Postural Control Postural Control: Deficits on evaluation Righting Reactions: delayed  Balance Balance Balance Assessed: Yes Standardized Balance Assessment Standardized Balance Assessment: Berg Balance Test Berg Balance Test Sit to Stand: Able to stand  independently using hands Standing Unsupported: Able to stand 2 minutes with supervision Sitting with Back Unsupported but Feet Supported on Floor or Stool: Able to sit safely and securely 2 minutes Stand to Sit: Controls descent by using hands Transfers: Able to transfer safely, definite need of hands Standing Unsupported with Eyes Closed: Able to stand 3 seconds Standing Ubsupported with  Feet Together: Able to place feet together independently and stand for 1 minute with supervision From Standing, Reach Forward with Outstretched Arm: Can reach forward >12 cm safely (5") From Standing Position, Pick up Object from Floor: Able to pick up shoe, needs supervision From Standing Position, Turn to Look Behind Over each Shoulder: Turn sideways only but maintains balance Turn 360 Degrees: Able to turn 360 degrees safely but  slowly Standing Unsupported, Alternately Place Feet on Step/Stool: Able to complete >2 steps/needs minimal assist Standing Unsupported, One Foot in Front: Able to take small step independently and hold 30 seconds Standing on One Leg: Unable to try or needs assist to prevent fall Total Score: 34 Dynamic Gait Index Level Surface: Mild Impairment Change in Gait Speed: Moderate Impairment Gait with Horizontal Head Turns: Moderate Impairment Gait with Vertical Head Turns: Mild Impairment Gait and Pivot Turn: Severe Impairment Step Over Obstacle: Mild Impairment Step Around Obstacles: Mild Impairment Steps: Mild Impairment Total Score: 12 Timed Up and Go Test TUG: Normal TUG Normal TUG (seconds): 17 Static Sitting Balance Static Sitting - Balance Support: Feet supported Static Sitting - Level of Assistance: 7: Independent Dynamic Sitting Balance Dynamic Sitting - Balance Support: During functional activity Dynamic Sitting - Level of Assistance: 5: Stand by assistance Static Standing Balance Static Standing - Balance Support: During functional activity Static Standing - Level of Assistance: 5: Stand by assistance Dynamic Standing Balance Dynamic Standing - Balance Support: During functional activity Dynamic Standing - Level of Assistance: 5: Stand by assistance Extremity Assessment  RUE Assessment RUE Assessment: Exceptions to [facility] Active Range of Motion (AROM) Comments: Full ROM limited by pain RUE Body System: Neuro Brunstrum levels for arm and hand:  Arm;Hand Brunstrum level for arm: Stage V Relative Independence from Synergy Brunstrum level for hand: Stage VI Isolated joint movements LUE Assessment LUE Assessment: Within Functional Limits RLE Assessment RLE Assessment: Within Functional Limits RLE Strength Right Hip Flexion: 4/5 Right Knee Flexion: 4+/5 Right Knee Extension: 4+/5 Right Ankle Dorsiflexion: 5/5 LLE Assessment LLE Assessment: Within Functional Limits General Strength Comments: 5/5 grossly    Rosita DeChalus 02/02/2022, 10:52 AM

## 2022-02-02 NOTE — Progress Notes (Signed)
Inpatient Rehabilitation Discharge Medication Review by a Pharmacist  A complete drug regimen review was completed for this patient to identify any potential clinically significant medication issues.  High Risk Drug Classes Is patient taking? Indication by Medication  Antipsychotic No   Anticoagulant No   Antibiotic Yes Cipro- UTI  Opioid No   Antiplatelet Yes Aspirin- CVA prophylaxis  Hypoglycemics/insulin No   Vasoactive Medication Yes Toprol xl, isordil- hypertension  Chemotherapy No   Other Yes Melatonin- sleep Flonase- seasonal rhinitis  Lipitor- HLD     Type of Medication Issue Identified Description of Issue Recommendation(s)  Drug Interaction(s) (clinically significant)     Duplicate Therapy     Allergy     No Medication Administration End Date     Incorrect Dose     Additional Drug Therapy Needed     Significant med changes from prior encounter (inform family/care partners about these prior to discharge).    Other       Clinically significant medication issues were identified that warrant physician communication and completion of prescribed/recommended actions by midnight of the next day:  No  Time spent performing this drug regimen review (minutes):  30   Deauna Yaw BS, PharmD, BCPS Clinical Pharmacist 02/02/2022 10:51 AM  Contact: [phone] after 3 PM  "Be curious, not judgmental..." -Jamal Maes

## 2022-02-02 NOTE — Progress Notes (Signed)
Inpatient Rehabilitation Care Coordinator Discharge Note   Patient Details  Name: Timothy Moran MRN: [id number] Date of Birth: [birth year]   Discharge location: D/c to home  Length of Stay: 7 days  Discharge activity level: Supervision  Home/community participation: Limited  Patient response Timothy Moran Literacy - How often do you need to have someone help you when you read instructions, pamphlets, or other written material from your doctor or pharmacy?: Never  Patient response Timothy Moran Isolation - How often do you feel lonely or isolated from those around you?: Never  Services provided included: RD, PT, OT, MD, SLP, RN, CM, Pharmacy, Neuropsych, SW, TR  Financial Services:  Financial Services Utilized: Medicare [employer] offered to/list presented to: Yes  Follow-up services arranged:  Outpatient    Outpatient Servicies: [facility] location for outpatient PT/OT      Patient response to transportation need: Is the patient able to respond to transportation needs?: Yes In the past 12 months, has lack of transportation kept you from medical appointments or from getting medications?: No In the past 12 months, has lack of transportation kept you from meetings, work, or from getting things needed for daily living?: No   Comments (or additional information):  Patient/Family verbalized understanding of follow-up arrangements:  Yes  Individual responsible for coordination of the follow-up plan: contact pt or pt Timothy Moran  Confirmed correct DME delivered: Timothy Moran 02/02/2022    Timothy Moran

## 2022-02-02 NOTE — Progress Notes (Signed)
Physical Therapy Session Note  Patient Details  Name: Aariz Maish MRN: [id number] Date of Birth: 1946-03-25  Today's Date: 02/02/2022 PT Individual Time: 5465-6812 and 7517-0017  PT Individual Time Calculation (min): 54 min and 20 min  Short Term Goals: Week 1:  PT Short Term Goal 1 (Week 1): Pt will perform bed mobility at Supervision level PT Short Term Goal 2 (Week 1): Pt will navigate stairs with min A and LRAD PT Short Term Goal 3 (Week 1): Pt will complete Berg Balance Test to determine fall risk  Skilled Therapeutic Interventions/Progress Updates: Tx1: Pt presented in recliner with wife present agreeable to therapy. Pt states pain manageable and pt states pain in shoulder and hip only with specific movements which pt tries to avoid. Session focused on functional mobility in preparation for d/c as pt anticipates d/c after therapies today. Pt performed Sit to stand with distant supervision and ambulated with supervision to ortho gym. Pt participated in car transfer, gait on ramp and uneven surfaces all at [facility] to supervision level. Pt also participated in balance and gait activities with scores noted as below with slight improvement in all assessments from eval score. PTA reinforced importance of using RW with all ambulation activities. Pt ambulated to ADL apt and performed bed mobility at independent level. Pt then ambulated to rehab gym and ascend/descended x 12 steps with 1 rail and supervision. Pt ambulated back to room at end of session and returned to recliner with call bell within reach and PA present to provide d/c instructions.   Tx2: Pt presented in recliner with wife present agreeable to therapy. Pt denies pain during session. Session focused on functional mobility and endurance in preparation for d/c. Pt ambulated at overall supervision level 333ft with RW. Pt expressed no significant increase in fatigue with activity. Pt then ambulated to rehab gym and participated in rebounder  without AD. Pt provided with intermittent breaks with activities and demonstrated improved righting reactions particularly with posterior lean. Pt ambulated back to room and returned to recliner at overall supervision level. Emphasized maintaining awareness with turns and when preparing to sit as PTA noted R foot catching when pt turned to sit. Pt and wife voiced understanding. Pt left in recliner at end of session with call bell within reach and current needs met.      Therapy Documentation Precautions:  Precautions Precautions: Fall Restrictions Weight Bearing Restrictions: No General:   Vital Signs:  Pain: Pain Assessment Pain Score: 0-No pain Mobility:   Locomotion :    Trunk/Postural Assessment : Cervical Assessment Cervical Assessment: Exceptions to [facility] Thoracic Assessment Thoracic Assessment: Within Functional Limits Lumbar Assessment Lumbar Assessment: Within Functional Limits Postural Control Postural Control: Deficits on evaluation Righting Reactions: delayed  Balance: Balance Balance Assessed: Yes Standardized Balance Assessment Standardized Balance Assessment: Berg Balance Test Berg Balance Test Sit to Stand: Able to stand  independently using hands Standing Unsupported: Able to stand 2 minutes with supervision Sitting with Back Unsupported but Feet Supported on Floor or Stool: Able to sit safely and securely 2 minutes Stand to Sit: Controls descent by using hands Transfers: Able to transfer safely, definite need of hands Standing Unsupported with Eyes Closed: Able to stand 3 seconds Standing Ubsupported with Feet Together: Able to place feet together independently and stand for 1 minute with supervision From Standing, Reach Forward with Outstretched Arm: Can reach forward >12 cm safely (5") From Standing Position, Pick up Object from Floor: Able to pick up shoe, needs supervision From Standing Position,  Turn to Look Behind Over each Shoulder: Turn sideways  only but maintains balance Turn 360 Degrees: Able to turn 360 degrees safely but slowly Standing Unsupported, Alternately Place Feet on Step/Stool: Able to complete >2 steps/needs minimal assist Standing Unsupported, One Foot in Front: Able to take small step independently and hold 30 seconds Standing on One Leg: Unable to try or needs assist to prevent fall Total Score: 34 Dynamic Gait Index Level Surface: Mild Impairment Change in Gait Speed: Moderate Impairment Gait with Horizontal Head Turns: Moderate Impairment Gait with Vertical Head Turns: Mild Impairment Gait and Pivot Turn: Severe Impairment Step Over Obstacle: Mild Impairment Step Around Obstacles: Mild Impairment Steps: Mild Impairment Total Score: 12 Timed Up and Go Test TUG: Normal TUG Normal TUG (seconds): 17 Static Sitting Balance Static Sitting - Balance Support: Feet supported Static Sitting - Level of Assistance: 7: Independent Dynamic Sitting Balance Dynamic Sitting - Balance Support: During functional activity Dynamic Sitting - Level of Assistance: 5: Stand by assistance Static Standing Balance Static Standing - Balance Support: During functional activity Static Standing - Level of Assistance: 5: Stand by assistance Dynamic Standing Balance Dynamic Standing - Balance Support: During functional activity Dynamic Standing - Level of Assistance: 5: Stand by assistance Exercises:   Other Treatments:      Therapy/Group: Individual Therapy  Reegan Mctighe 02/02/2022, 10:35 AM

## 2022-02-02 NOTE — Progress Notes (Signed)
Recreational Therapy Discharge Summary Patient Details  Name: Elishua Radford MRN: [id number] Date of Birth: December 03, 1945 Today's Date: 02/02/2022  Long term goals set: 1  Long term goals met: 1  Comments on progress toward goals: Pt has made good progress during LOS and is determined to make continued progress once home.  Pt with limited TR participation but is discharging home today at supervision ambulatory level using RW.  TR session focused on pt education in regards to activity analysis/modifications and stress management.  Pt stated understanding of the above.  Pt participated in moderately complex standing tasks needing min assist for balance.  Pt is discharging home today with wife to provide supervision.  Reasons for discharge: discharge from hospital  Follow-up: Outpatient  Patient/family agrees with progress made and goals achieved: Yes  Bernabe Dorce 02/02/2022, 3:26 PM

## 2022-02-04 LAB — URINE CULTURE: Culture: >=100000 — AB

## 2022-02-08 ENCOUNTER — Ambulatory Visit (INDEPENDENT_AMBULATORY_CARE_PROVIDER_SITE_OTHER): Payer: Medicare Other

## 2022-02-08 DIAGNOSIS — I509 Heart failure, unspecified: Secondary | ICD-10-CM | POA: Diagnosis not present

## 2022-02-08 DIAGNOSIS — R269 Unspecified abnormalities of gait and mobility: Secondary | ICD-10-CM | POA: Diagnosis not present

## 2022-02-08 DIAGNOSIS — I255 Ischemic cardiomyopathy: Secondary | ICD-10-CM

## 2022-02-08 DIAGNOSIS — Z7189 Other specified counseling: Secondary | ICD-10-CM | POA: Diagnosis not present

## 2022-02-08 DIAGNOSIS — N1832 Chronic kidney disease, stage 3b: Secondary | ICD-10-CM | POA: Diagnosis not present

## 2022-02-08 DIAGNOSIS — W1789XA Other fall from one level to another, initial encounter: Secondary | ICD-10-CM | POA: Diagnosis not present

## 2022-02-08 DIAGNOSIS — I2510 Atherosclerotic heart disease of native coronary artery without angina pectoris: Secondary | ICD-10-CM | POA: Diagnosis not present

## 2022-02-08 DIAGNOSIS — I25118 Atherosclerotic heart disease of native coronary artery with other forms of angina pectoris: Secondary | ICD-10-CM | POA: Diagnosis not present

## 2022-02-08 DIAGNOSIS — I5022 Chronic systolic (congestive) heart failure: Secondary | ICD-10-CM | POA: Diagnosis not present

## 2022-02-08 DIAGNOSIS — R29898 Other symptoms and signs involving the musculoskeletal system: Secondary | ICD-10-CM | POA: Diagnosis not present

## 2022-02-08 DIAGNOSIS — D329 Benign neoplasm of meninges, unspecified: Secondary | ICD-10-CM | POA: Diagnosis not present

## 2022-02-08 DIAGNOSIS — R339 Retention of urine, unspecified: Secondary | ICD-10-CM | POA: Diagnosis not present

## 2022-02-08 LAB — CUP PACEART REMOTE DEVICE CHECK
Battery Remaining Longevity: 113 mo
Battery Voltage: 3.06 V
Brady Statistic AP VP Percent: 32.87 %
Brady Statistic AP VS Percent: 0.65 %
Brady Statistic AS VP Percent: 65.21 %
Brady Statistic AS VS Percent: 1.28 %
Brady Statistic RA Percent Paced: 32.63 %
Brady Statistic RV Percent Paced: 55.69 %
Date Time Interrogation Session: 20230530043729
HighPow Impedance: 69 Ohm
Implantable Lead Implant Date: 20230222
Implantable Lead Implant Date: 20230222
Implantable Lead Implant Date: 20230222
Implantable Lead Location: 753858
Implantable Lead Location: 753859
Implantable Lead Location: 753860
Implantable Lead Model: 4798
Implantable Lead Model: 5076
Implantable Pulse Generator Implant Date: 20230222
Lead Channel Impedance Value: 203.256
Lead Channel Impedance Value: 214.783
Lead Channel Impedance Value: 218.298
Lead Channel Impedance Value: 235.98 Ohm
Lead Channel Impedance Value: 251.66 Ohm
Lead Channel Impedance Value: 380 Ohm
Lead Channel Impedance Value: 437 Ohm
Lead Channel Impedance Value: 437 Ohm
Lead Channel Impedance Value: 494 Ohm
Lead Channel Impedance Value: 513 Ohm
Lead Channel Impedance Value: 570 Ohm
Lead Channel Impedance Value: 627 Ohm
Lead Channel Impedance Value: 627 Ohm
Lead Channel Impedance Value: 665 Ohm
Lead Channel Impedance Value: 779 Ohm
Lead Channel Impedance Value: 779 Ohm
Lead Channel Impedance Value: 855 Ohm
Lead Channel Impedance Value: 855 Ohm
Lead Channel Pacing Threshold Amplitude: 0.5 V
Lead Channel Pacing Threshold Amplitude: 0.5 V
Lead Channel Pacing Threshold Amplitude: 0.75 V
Lead Channel Pacing Threshold Pulse Width: 0.4 ms
Lead Channel Pacing Threshold Pulse Width: 0.4 ms
Lead Channel Pacing Threshold Pulse Width: 0.4 ms
Lead Channel Sensing Intrinsic Amplitude: 1.875 mV
Lead Channel Sensing Intrinsic Amplitude: 1.875 mV
Lead Channel Sensing Intrinsic Amplitude: 13.125 mV
Lead Channel Sensing Intrinsic Amplitude: 13.125 mV
Lead Channel Setting Pacing Amplitude: 1.25 V
Lead Channel Setting Pacing Amplitude: 1.5 V
Lead Channel Setting Pacing Amplitude: 2 V
Lead Channel Setting Pacing Pulse Width: 0.4 ms
Lead Channel Setting Pacing Pulse Width: 0.4 ms
Lead Channel Setting Sensing Sensitivity: 0.3 mV

## 2022-02-10 ENCOUNTER — Ambulatory Visit: Payer: Medicare Other | Attending: Physician Assistant | Admitting: Physical Therapy

## 2022-02-10 ENCOUNTER — Ambulatory Visit: Payer: Medicare Other | Admitting: Occupational Therapy

## 2022-02-10 ENCOUNTER — Encounter: Payer: Self-pay | Admitting: Physical Therapy

## 2022-02-10 DIAGNOSIS — R29898 Other symptoms and signs involving the musculoskeletal system: Secondary | ICD-10-CM

## 2022-02-10 DIAGNOSIS — W19XXXA Unspecified fall, initial encounter: Secondary | ICD-10-CM

## 2022-02-10 DIAGNOSIS — M6281 Muscle weakness (generalized): Secondary | ICD-10-CM

## 2022-02-10 DIAGNOSIS — M79674 Pain in right toe(s): Secondary | ICD-10-CM | POA: Diagnosis not present

## 2022-02-10 DIAGNOSIS — R2689 Other abnormalities of gait and mobility: Secondary | ICD-10-CM

## 2022-02-10 DIAGNOSIS — Y92009 Unspecified place in unspecified non-institutional (private) residence as the place of occurrence of the external cause: Secondary | ICD-10-CM | POA: Insufficient documentation

## 2022-02-10 DIAGNOSIS — B351 Tinea unguium: Secondary | ICD-10-CM | POA: Diagnosis not present

## 2022-02-10 DIAGNOSIS — R278 Other lack of coordination: Secondary | ICD-10-CM | POA: Diagnosis not present

## 2022-02-10 DIAGNOSIS — M25511 Pain in right shoulder: Secondary | ICD-10-CM | POA: Diagnosis not present

## 2022-02-10 DIAGNOSIS — R2681 Unsteadiness on feet: Secondary | ICD-10-CM | POA: Diagnosis not present

## 2022-02-10 DIAGNOSIS — R531 Weakness: Secondary | ICD-10-CM

## 2022-02-10 DIAGNOSIS — M79675 Pain in left toe(s): Secondary | ICD-10-CM | POA: Diagnosis not present

## 2022-02-10 DIAGNOSIS — D329 Benign neoplasm of meninges, unspecified: Secondary | ICD-10-CM

## 2022-02-10 DIAGNOSIS — L600 Ingrowing nail: Secondary | ICD-10-CM | POA: Diagnosis not present

## 2022-02-10 NOTE — Therapy (Unsigned)
OUTPATIENT OCCUPATIONAL THERAPY NEURO EVALUATION   Patient Name: Timothy Moran MRN: [id number] DOB:01-12-1946, 76 y.o., male Today's Date: 02/10/2022  PCP: Cari Caraway, MD REFERRING PROVIDER: Barbie Banner, PA-C    OT End of Session - 02/10/22 1403     Visit Number 1            Past Medical History:  Diagnosis Date   Acute blood loss anemia 10/05/2020   Acute respiratory failure ([place]), hypothermia therapy, vent - extubated 10/06/20    AKI (acute kidney injury) ([place]) 10/05/2020   Anoxic brain injury ([place]) 10/26/2020   Arrhythmia    Ascending aortic aneurysm ([place]) 01/29/2018   a. 2019 4.3 cm by echo; b. 02/2021 Echo: Ao root 47m.   Benign brain tumor ([place]    Benign neoplasm of brain ([place] 12/19/2017   Cardiac arrest with ventricular fibrillation ([place]    Chest pain 12/04/2017   Chronic kidney disease, stage III (moderate) ([place] 12/19/2017   CKD (chronic kidney disease), stage III - IV (HCC)    Coronary Artery Disease    a. 13/0865Inf STEMI complicated by cardiac arrest/PCI: LAD 50p/m, LCX 100d (2.5 x 30 Resolute Onyx DES); b. 04/2021 PCI: 04/2021 LM nl, LAD 50p/873m2.5x30 Onyx Frontier DES), LCX 25p, 5045mFR 0.98), 40d ISR (RFR 0.98), OM2 25, RCA mild diff dzs.   Diabetes mellitus, type 2 ([facility]CC[facility]1/25/2022   10/06/20 A1C 7%   Dizziness 10/11/2019   Encounter for imaging study to confirm orogastric (OG) tube placement    Hematuria 10/26/2020   HFrEF (heart failure with reduced ejection fraction) ([place]  a. 11/2020 Echo: EF 25-30%; b. 02/2021 Echo: EF 25-30%, glob HK. Mild LVH. Nl RV fxn. Triv AI. Ao root 21m52m History of pulmonary embolus (PE)    HLD (hyperlipidemia) 10/26/2020   Hypertension    Iron deficiency anemia rec'd IV iron 10/26/2020   Ischemic cardiomyopathy    a. 11/2020 Echo: EF 25-30%; b. 02/2021 Echo: EF 25-30%, glob HK.   Left bundle branch block 12/19/2017   Meningioma (HCC)[place]/11/2019   Nonsustained ventricular tachycardia (HCC)[place]/29/2021    Pain of left hip joint 05/29/2020   Personal history of pulmonary embolism 12/19/2017   Pneumonia of both lungs due to infectious organism    Pulmonary hypertension due to thromboembolism (HCC)[place]/28/2019   See echo 12/27/17 with nl PAS vs CTa chest 02/23/18    S/P angioplasty with stent 10/04/20 DES to LCX  10/26/2020   Sinus bradycardia 12/22/2017   SOB (shortness of breath)    Solitary pulmonary nodule on lung CT 03/08/2018   CT 12/04/17 1.0 x 0.8 x 0.7 cm nodular opacity in the RUL vs not seen 03/16/10 posterior segment of the right upper lobe. .[place] Kitchen[facility] 03/08/2018    FEV1 3.86 (108%)  Ratio 96 s prior rx  - PET  03/13/18   Low grade c/w adenoca > rec T surgery eval     STEMI (ST elevation myocardial infarction) (HCC)[place]/23/2022   Urinary retention 10/26/2020   Vestibular schwannoma (HCC)[place]/11/2019   Past Surgical History:  Procedure Laterality Date   APPENDECTOMY     BIV ICD INSERTION CRT-D N/A 11/03/2021   Procedure: BIV ICD INSERTION CRT-D;  Surgeon: CamnConstance Haw;  [facility];  Service: Cardiovascular;  Laterality: N/A;   CARDIAC CATHETERIZATION     CORONARY ANGIOPLASTY WITH STENT PLACEMENT Left 04/12/2021   LAD stent placement   CORONARY STENT INTERVENTION N/A 04/12/2021   Procedure:  CORONARY STENT INTERVENTION;  Surgeon: Nelva Bush, MD;  Location: [place] [facility];  Service: Cardiovascular;  Laterality: N/A;   CORONARY/GRAFT ACUTE MI REVASCULARIZATION N/A 10/04/2020   Procedure: Coronary/Graft Acute MI Revascularization;  Surgeon: Nelva Bush, MD;  Location: [place] [facility];  Service: Cardiovascular;  Laterality: N/A;   HERNIA REPAIR     INTRAVASCULAR PRESSURE WIRE/FFR STUDY N/A 04/12/2021   Procedure: INTRAVASCULAR PRESSURE WIRE/FFR STUDY;  Surgeon: Nelva Bush, MD;  Location: [place] [facility];  Service: Cardiovascular;  Laterality: N/A;   INTRAVASCULAR ULTRASOUND/IVUS N/A 04/12/2021   Procedure: Intravascular Ultrasound/IVUS;  Surgeon:  Nelva Bush, MD;  Location: [place] [facility];  Service: Cardiovascular;  Laterality: N/A;   LEFT HEART CATH AND CORONARY ANGIOGRAPHY N/A 10/04/2020   Procedure: LEFT HEART CATH AND CORONARY ANGIOGRAPHY;  Surgeon: Nelva Bush, MD;  Location: [place] [facility];  Service: Cardiovascular;  Laterality: N/A;   RIGHT/LEFT HEART CATH AND CORONARY ANGIOGRAPHY N/A 04/12/2021   Procedure: RIGHT/LEFT HEART CATH AND CORONARY ANGIOGRAPHY;  Surgeon: Nelva Bush, MD;  Location: [place] [facility];  Service: Cardiovascular;  Laterality: N/A;   Patient Active Problem List   Diagnosis Date Noted   Anemia    Leukocytosis    Right arm weakness 01/25/2022   Right sided weakness    Fall at home 01/22/2022   Status post biventricular pacemaker - MRI compatible PPM and leads 01/22/2022   Chronic indwelling Foley catheter 01/22/2022   CKD (chronic kidney disease), stage III - IV ([place]) 06/28/2021   Chronic HFrEF (heart failure with reduced ejection fraction) ([place]) 03/31/2021   Arrhythmia    DM (diabetes mellitus), type 2 ([place]) new 10/26/2020   Urinary retention 10/26/2020   Hyperlipidemia LDL goal <70 10/26/2020   Coronary artery disease of native artery of native heart with stable angina pectoris ([place]) 10/26/2020   S/P angioplasty with stent 10/04/20 DES to LCX  10/26/2020   Ischemic cardiomyopathy 10/26/2020   Diabetes mellitus, type 2 ([place]) 10/06/2020   DOE (dyspnea on exertion)    Meningioma ([place]) 08/14/2020   Vestibular schwannoma ([place]) 08/14/2020   Cardiac arrest with ventricular fibrillation (HCC)    Pain of left hip joint 05/29/2020   Benign brain tumor ([place])    Chronic kidney disease    History of pulmonary embolus (PE)    Hypertension    Nonsustained ventricular tachycardia ([place]) 10/11/2019   Dizziness 10/11/2019   Abnormal echocardiogram 06/07/2019   Pulmonary hypertension due to thromboembolism ([place]) 03/09/2018   Solitary pulmonary nodule on lung CT 03/08/2018   Ascending  aortic aneurysm (HCC) 4.2 cm based on CT from December 2020 01/29/2018   Sinus bradycardia 12/22/2017   Left bundle branch block 12/19/2017   Personal history of pulmonary embolism - no longer on anticoagulation due to pt refusal to take it anymore. 12/19/2017   Chronic kidney disease, stage III (moderate) ([place]) 12/19/2017   Benign neoplasm of brain ([place]) 12/19/2017    ONSET DATE: ***  REFERRING DIAG: ***  THERAPY DIAG:  No diagnosis found.  Rationale for Evaluation and Treatment {HABREHAB:27488}  SUBJECTIVE:   SUBJECTIVE STATEMENT: *** Pt accompanied by: {accompnied:27141}  PERTINENT HISTORY: ***  PRECAUTIONS: Fall; no driving due to R-sided weakness;   WEIGHT BEARING RESTRICTIONS No  PAIN:  Are you having pain? No  FALLS: Has patient fallen in last 6 months? Yes. Number of falls 1;   LIVING ENVIRONMENT: Lives with: lives with their spouse Lives in: House/apartment Stairs: Yes: External: 6 steps; on right going up Has  following equipment at home: Single point cane and Walker - 2 wheeled  PLOF: Independent  PATIENT GOALS ***  OBJECTIVE:   HAND DOMINANCE: Right  ADLs: Overall ADLs: *** Transfers/ambulation related to ADLs: Eating: *** Grooming: *** UB Dressing: *** LB Dressing: *** Toileting: *** Bathing: *** Tub Shower transfers: Mod I Equipment: Transfer tub bench and Walk in shower; grab bars in the shower; elevated toilet seats; shoe horn   IADLs: Shopping: *** Light housekeeping: *** Meal Prep: *** Community mobility: Relies on family for driving Medication management: *** Financial management: *** Handwriting: {OTWRITTENEXPRESSION:25361}  MOBILITY STATUS: {OTMOBILITY:25360}  POSTURE COMMENTS:  {posture:25561} Sitting balance: {sitting balance:25483}  ACTIVITY TOLERANCE: Activity tolerance: ***  FUNCTIONAL OUTCOME MEASURES: Upper Extremity Functional Index: 68/80 Double Number Cancellation: 33/37 Trail Making Test: Part A: 26.8  sec, Part B: 1 min, 40.6 sec  UPPER EXTREMITY ROM     {AROM/PROM:27142} ROM Right eval Left eval  Shoulder flexion    Shoulder abduction    Shoulder adduction    Shoulder extension    Shoulder internal rotation    Shoulder external rotation    Elbow flexion    Elbow extension    Wrist flexion    Wrist extension    Wrist ulnar deviation    Wrist radial deviation    Wrist pronation    Wrist supination    (Blank rows = not tested)   UPPER EXTREMITY MMT:     MMT Right eval Left eval  Shoulder flexion    Shoulder abduction    Shoulder adduction    Shoulder extension    Shoulder internal rotation    Shoulder external rotation    Middle trapezius    Lower trapezius    Elbow flexion    Elbow extension    Wrist flexion    Wrist extension    Wrist ulnar deviation    Wrist radial deviation    Wrist pronation    Wrist supination    (Blank rows = not tested)  HAND FUNCTION: 3 point pinch: Right: 18 lbs, Left: 16 lbs Lateral pinch: Right: 19 lbs, Left: 18.5 lbs  COORDINATION: Finger Nose Finger test: Mild dysmetria on R side 9 Hole Peg test: Right: 20.9 sec; Left: 24.2 sec Box and Blocks:  Right 31 blocks, Left 29 blocks  SENSATION: Light touch: WFL Hot/Cold: Reports slight difference in temperature ***  EDEMA: ***  MUSCLE TONE: {UETONE:25567}  COGNITION: Overall cognitive status: {cognition:24006}  VISION: Subjective report: *** Baseline vision: {OTBASELINEVISION:25363} Visual history: {OTVISUALHISTORY:25364}  VISION ASSESSMENT: Tracking/Visual pursuits: Able to track stimulus in all quads without difficulty Saccades: WFL Convergence: WFL Visual Fields: {OTVISUALFIELDS:25371}  Patient has difficulty with following activities due to following visual impairments: ***  PERCEPTION: Impaired: {Perception:25563}  PRAXIS: {Praxis:25565}  OBSERVATIONS: ***   TODAY'S TREATMENT:  ***   PATIENT EDUCATION: Education details: *** Person educated:  {Person educated:25204} Education method: {Education Method:25205} Education comprehension: {Education Comprehension:25206}   HOME EXERCISE PROGRAM: ***    GOALS: Goals reviewed with patient? {yes/no:20286}  SHORT TERM GOALS: Target date: ***  *** Baseline: Goal status: {GOALSTATUS:25110}  2.  *** Baseline:  Goal status: {GOALSTATUS:25110}  3.  *** Baseline:  Goal status: {GOALSTATUS:25110}  4.  *** Baseline:  Goal status: {GOALSTATUS:25110}  5.  *** Baseline:  Goal status: {GOALSTATUS:25110}  6.  *** Baseline:  Goal status: {GOALSTATUS:25110}  LONG TERM GOALS: Target date: ***  *** Baseline:  Goal status: {GOALSTATUS:25110}  2.  *** Baseline:  Goal status: {GOALSTATUS:25110}  3.  *** Baseline:  Goal status: {GOALSTATUS:25110}  4.  *** Baseline:  Goal status: {GOALSTATUS:25110}  5.  *** Baseline:  Goal status: {GOALSTATUS:25110}  6.  *** Baseline:  Goal status: {GOALSTATUS:25110}  ASSESSMENT:  CLINICAL IMPRESSION: Patient is a *** y.o. *** who was seen today for occupational therapy evaluation for ***.   PERFORMANCE DEFICITS in functional skills including {OT physical skills:25468}, cognitive skills including {OT cognitive skills:25469}, and psychosocial skills including {OT psychosocial skills:25470}.   IMPAIRMENTS are limiting patient from {OT performance deficits:25471}.   COMORBIDITIES {Comorbidities:25485} that affects occupational performance. Patient will benefit from skilled OT to address above impairments and improve overall function.  MODIFICATION OR ASSISTANCE TO COMPLETE EVALUATION: {OT modification:25474}  OT OCCUPATIONAL PROFILE AND HISTORY: {OT PROFILE AND HISTORY:25484}  CLINICAL DECISION MAKING: {OT CDM:25475}  REHAB POTENTIAL: {rehabpotential:25112}  EVALUATION COMPLEXITY: {Evaluation complexity:25115}    PLAN: OT FREQUENCY: {rehab frequency:25116}  OT DURATION: {rehab duration:25117}  PLANNED  INTERVENTIONS: {OT Interventions:25467}  RECOMMENDED OTHER SERVICES: ***  CONSULTED AND AGREED WITH PLAN OF CARE: {FEO:71219}  PLAN FOR NEXT SESSION: Kathrine Cords, MSOT, OTR/L 02/10/2022, 2:04 PM

## 2022-02-10 NOTE — Therapy (Signed)
OUTPATIENT PHYSICAL THERAPY NEURO EVALUATION   Patient Name: Timothy Moran MRN: [id number] DOB:Jul 01, 1946, 76 y.o., male Today's Date: 02/10/2022   PCP: Mc[place], [place] PROVIDER: Barbie Banner, PA-C    PT End of Session - 02/10/22 1637     Visit Number 1    Date for PT Re-Evaluation 04/21/22    PT Start Time 1446    PT Stop Time 1527    PT Time Calculation (min) 41 min    Equipment Utilized During Treatment Gait belt    Activity Tolerance Patient tolerated treatment well    Behavior During Therapy [facility] for tasks assessed/performed             Past Medical History:  Diagnosis Date   Acute blood loss anemia 10/05/2020   Acute respiratory failure ([place]), hypothermia therapy, vent - extubated 10/06/20    AKI (acute kidney injury) ([place]) 10/05/2020   Anoxic brain injury ([place]) 10/26/2020   Arrhythmia    Ascending aortic aneurysm ([place]) 01/29/2018   a. 2019 4.3 cm by echo; b. 02/2021 Echo: Ao root 34m.   Benign brain tumor (HH[place]    Benign neoplasm of brain (HIdalou 12/19/2017   Cardiac arrest with ventricular fibrillation (HCleburne    Chest pain 12/04/2017   Chronic kidney disease, stage III (moderate) ([place]Box Canyon 12/19/2017   CKD (chronic kidney disease), stage III - IV (HCC)    Coronary Artery Disease    a. 13/4742Inf STEMI complicated by cardiac arrest/PCI: LAD 50p/m, LCX 100d (2.5 x 30 Resolute Onyx DES); b. 04/2021 PCI: 04/2021 LM nl, LAD 50p/823m2.5x30 Onyx Frontier DES), LCX 25p, 5024mFR 0.98), 40d ISR (RFR 0.98), OM2 25, RCA mild diff dzs.   Diabetes mellitus, type 2 (HCCPelham1/25/2022   10/06/20 A1C 7%   Dizziness 10/11/2019   Encounter for imaging study to confirm orogastric (OG) tube placement    Hematuria 10/26/2020   HFrEF (heart failure with reduced ejection fraction) (HCCFair[place]  a. 11/2020 Echo: EF 25-30%; b. 02/2021 Echo: EF 25-30%, glob HK. Mild LVH. Nl RV fxn. Triv AI. Ao root 2m12m History of pulmonary embolus (PE)    HLD (hyperlipidemia) 10/26/2020    Hypertension    Iron deficiency anemia rec'd IV iron 10/26/2020   Ischemic cardiomyopathy    a. 11/2020 Echo: EF 25-30%; b. 02/2021 Echo: EF 25-30%, glob HK.   Left bundle branch block 12/19/2017   Meningioma (HCC)[place]/11/2019   Nonsustained ventricular tachycardia (HCC)[place]/29/2021   Pain of left hip joint 05/29/2020   Personal history of pulmonary embolism 12/19/2017   Pneumonia of both lungs due to infectious organism    Pulmonary hypertension due to thromboembolism (HCC)[place]/28/2019   See echo 12/27/17 with nl PAS vs CTa chest 02/23/18    S/P angioplasty with stent 10/04/20 DES to LCX  10/26/2020   Sinus bradycardia 12/22/2017   SOB (shortness of breath)    Solitary pulmonary nodule on lung CT 03/08/2018   CT 12/04/17 1.0 x 0.8 x 0.7 cm nodular opacity in the RUL vs not seen 03/16/10 posterior segment of the right upper lobe. .SpiMarland Kitchenometry 03/08/2018    FEV1 3.86 (108%)  Ratio 96 s prior rx  - PET  03/13/18   Low grade c/w adenoca > rec T surgery eval     STEMI (ST elevation myocardial infarction) (HCC)[place]/23/2022   Urinary retention 10/26/2020   Vestibular schwannoma (HCC)[place]/11/2019   Past Surgical History:  Procedure Laterality Date   APPENDECTOMY     BIV  ICD INSERTION CRT-D N/A 11/03/2021   Procedure: BIV ICD INSERTION CRT-D;  Surgeon: Constance Haw, MD;  Location: [place] [facility];  Service: Cardiovascular;  Laterality: N/A;   CARDIAC CATHETERIZATION     CORONARY ANGIOPLASTY WITH STENT PLACEMENT Left 04/12/2021   LAD stent placement   CORONARY STENT INTERVENTION N/A 04/12/2021   Procedure: CORONARY STENT INTERVENTION;  Surgeon: Nelva Bush, MD;  Location: [place] [facility];  Service: Cardiovascular;  Laterality: N/A;   CORONARY/GRAFT ACUTE MI REVASCULARIZATION N/A 10/04/2020   Procedure: Coronary/Graft Acute MI Revascularization;  Surgeon: Nelva Bush, MD;  Location: [place] [facility];  Service: Cardiovascular;  Laterality: N/A;   HERNIA REPAIR     INTRAVASCULAR PRESSURE  WIRE/FFR STUDY N/A 04/12/2021   Procedure: INTRAVASCULAR PRESSURE WIRE/FFR STUDY;  Surgeon: Nelva Bush, MD;  Location: [place] [facility] LAB;  Service: Cardiovascular;  Laterality: N/A;   INTRAVASCULAR ULTRASOUND/IVUS N/A 04/12/2021   Procedure: Intravascular Ultrasound/IVUS;  Surgeon: Nelva Bush, MD;  Location: [place] [facility] LAB;  Service: Cardiovascular;  Laterality: N/A;   LEFT HEART CATH AND CORONARY ANGIOGRAPHY N/A 10/04/2020   Procedure: LEFT HEART CATH AND CORONARY ANGIOGRAPHY;  Surgeon: Nelva Bush, MD;  Location: [place] [facility];  Service: Cardiovascular;  Laterality: N/A;   RIGHT/LEFT HEART CATH AND CORONARY ANGIOGRAPHY N/A 04/12/2021   Procedure: RIGHT/LEFT HEART CATH AND CORONARY ANGIOGRAPHY;  Surgeon: Nelva Bush, MD;  Location: [place] [facility] LAB;  Service: Cardiovascular;  Laterality: N/A;   Patient Active Problem List   Diagnosis Date Noted   Anemia    Leukocytosis    Right arm weakness 01/25/2022   Right sided weakness    Fall at home 01/22/2022   Status post biventricular pacemaker - MRI compatible PPM and leads 01/22/2022   Chronic indwelling Foley catheter 01/22/2022   CKD (chronic kidney disease), stage III - IV ([place]) 06/28/2021   Chronic HFrEF (heart failure with reduced ejection fraction) ([place]) 03/31/2021   Arrhythmia    DM (diabetes mellitus), type 2 ([place]) new 10/26/2020   Urinary retention 10/26/2020   Hyperlipidemia LDL goal <70 10/26/2020   Coronary artery disease of native artery of native heart with stable angina pectoris ([place]) 10/26/2020   S/P angioplasty with stent 10/04/20 DES to LCX  10/26/2020   Ischemic cardiomyopathy 10/26/2020   Diabetes mellitus, type 2 ([place]) 10/06/2020   DOE (dyspnea on exertion)    Meningioma ([place]) 08/14/2020   Vestibular schwannoma ([place]) 08/14/2020   Cardiac arrest with ventricular fibrillation (HCC)    Pain of left hip joint 05/29/2020   Benign brain tumor ([place])    Chronic kidney disease    History of  pulmonary embolus (PE)    Hypertension    Nonsustained ventricular tachycardia ([place]) 10/11/2019   Dizziness 10/11/2019   Abnormal echocardiogram 06/07/2019   Pulmonary hypertension due to thromboembolism ([place]) 03/09/2018   Solitary pulmonary nodule on lung CT 03/08/2018   Ascending aortic aneurysm (HCC) 4.2 cm based on CT from December 2020 01/29/2018   Sinus bradycardia 12/22/2017   Left bundle branch block 12/19/2017   Personal history of pulmonary embolism - no longer on anticoagulation due to pt refusal to take it anymore. 12/19/2017   Chronic kidney disease, stage III (moderate) ([place]) 12/19/2017   Benign neoplasm of brain ([place]) 12/19/2017    ONSET DATE: 02/01/2022   REFERRING DIAG: Diagnosis R53.1 (ICD-10-CM) - Right sided weakness D32.9 (ICD-10-CM) - Meningioma (HCC)   THERAPY DIAG:  Right sided weakness  Meningioma (HCC)  Muscle weakness (generalized)  Fall  in home, initial encounter  Poor body mechanics  Other abnormalities of gait and mobility  Other symptoms and signs involving the musculoskeletal system  Rationale for Evaluation and Treatment Rehabilitation  SUBJECTIVE:                                                                                                                                                                                              SUBJECTIVE STATEMENT: Patient had a fall where he tripped, hitting his R side, with R RTC, banged R hip area. He also has 30 year H/O  L parafalcine meningioma. He was in Inpatient Rehab x 12 days and then returned home. Pt accompanied by: self and significant other  PERTINENT HISTORY: Brief HPI:   Jordin Vicencio is a 76 y.o. male who fell backward striking his head and right neck shoulder area on 01/21/2022. He complained of right hemiparesis. CT head revealed left parafalcine meningioma.Started on Decadron with improvement in symptoms   PAIN:  Are you having pain? No  PRECAUTIONS: None  WEIGHT  BEARING RESTRICTIONS No  FALLS: Has patient fallen in last 6 months? Yes. Number of falls 1  LIVING ENVIRONMENT: Lives with: lives with their spouse Lives in: House/apartment Stairs: Yes: External: 7 steps; bilateral but cannot reach both Has following equipment at home: Walker - 2 wheeled  PLOF: [place] Patient would like to return to normal. Able to participate in all activities, he was not limited before.  OBJECTIVE:   DIAGNOSTIC FINDINGS: IMPRESSION: 1. Massive full-thickness tear of the entire AP dimension of the supraspinatus. High-grade partial-thickness tearing of the anterior infraspinatus. 2. Oval low signal structure deep to the anterior infraspinatus partially torn tendon fibers, a possible calcific loose body measuring up to 14 mm. 3. Moderate superior subscapularis partial-thickness tearing with tendon retraction and marrow fat intensity 9 mm loose body within the retracted tendon fibers. 4. Moderate supraspinatus and mild-to-moderate infraspinatus muscle atrophy. 5. The superior subscapularis tendon tear allows the long head of the biceps tendon to be perched on the anterior superior aspect of the lesser tuberosity at the biceps tendon enters the bicipital groove. Moderate proximal biceps tendinosis. 6. Moderate to severe acromioclavicular osteoarthritis. 7. Moderate glenohumeral osteoarthritis.  COGNITION: Overall cognitive status: Within functional limits for tasks assessed   SENSATION: Not tested  COORDINATION: Mildly ataxic      MUSCLE LENGTH: Hamstrings: Right 72 deg; Left 78 deg     POSTURE: rounded shoulders  LOWER EXTREMITY ROM:   PROM WFL in LE   LOWER EXTREMITY MMT:    MMT Right Eval Left Eval  Hip flexion 4 4  Hip extension 4- 4  Hip abduction 4- 4  Hip adduction    Hip internal rotation    Hip external rotation    Knee flexion 4 4  Knee extension 4 4  Ankle dorsiflexion 4 4  Ankle plantarflexion     Ankle inversion    Ankle eversion    (Blank rows = not tested)  BED MOBILITY:  I with all  TRANSFERS: Assistive device utilized: None  Sit to stand: SBA Stand to sit: SBA Chair to chair: SBA    STAIRS:  Level of Assistance: SBA  Stair Negotiation Technique: Alternating Pattern  with Single Rail on Right  Number of Stairs: 5   Height of Stairs: 4-6"  Comments: slow, but stable  GAIT: Gait pattern: step through pattern, shuffling, and ataxic Distance walked: 100' Assistive device utilized: None Level of assistance: SBA Comments: Very unsteady with LOB, especially after first rising.  FUNCTIONAL TESTs:  5 times sit to stand: 16.97 Timed up and go (TUG): 14.79 Functional gait assessment: 13/30  TODAY'S TREATMENT:  Gait training, patient education    PATIENT EDUCATION: Education details: POC Person educated: Patient and Spouse Education method: Explanation Education comprehension: verbalized understanding   HOME EXERCISE PROGRAM: TBD    GOALS: Goals reviewed with patient? Yes  SHORT TERM GOALS: Target date: 03/10/2022  I with initial HEP Baseline: Goal status: INITIAL  2.  FGA improved to 16/30 Baseline: 13 Goal status: INITIAL   LONG TERM GOALS: Target date: 04/21/2022  I with final HEP Baseline:  Goal status: INITIAL  2.  Paitent will complete 5x STS in < 12 seconds to demosntrate improved balance and LE strength. Baseline: 16.9 with unsteadiness each time he rises. Goal status: INITIAL  3.  Patient will ambulate x at least 1000' on level and unlevel surfaces I, no loss of balance or unsteadiness noted. Baseline: Ataxic, unsteady, 100' Goal status: INITIAL  4.  Complete TUG in < 10 sec to demosntrate improved balance Baseline: 14.79 Goal status: INITIAL  5.  Patient will score at least 21/27 Baseline: 13/27-Patient cannot perform heel toe gait. Goal status: INITIAL    ASSESSMENT:  CLINICAL IMPRESSION: Patient is a 76 y.o. who  was seen today for physical therapy evaluation and treatment for Ataxia, weakness, tightness, decreased safety and I with all functional mobility.    OBJECTIVE IMPAIRMENTS Abnormal gait, decreased balance, decreased coordination, decreased endurance, decreased mobility, difficulty walking, decreased ROM, decreased strength, impaired flexibility, improper body mechanics, and postural dysfunction.   ACTIVITY LIMITATIONS carrying, lifting, squatting, stairs, transfers, reach over head, and locomotion level  PARTICIPATION LIMITATIONS: cleaning, laundry, shopping, and yard work  [place] Past/current experiences and 1 comorbidity: L meningioma  are also affecting patient's functional outcome.   REHAB POTENTIAL: Good  CLINICAL DECISION MAKING: Evolving/moderate complexity  EVALUATION COMPLEXITY: Moderate  PLAN: PT FREQUENCY: 2x/week  PT DURATION: 10 weeks  PLANNED INTERVENTIONS: Therapeutic exercises, Therapeutic activity, Neuromuscular re-education, Balance training, Gait training, Patient/Family education, Joint mobilization, Stair training, and Manual therapy  PLAN FOR NEXT SESSION: Initiate HEP for stretch, strengthening, balance training   Marcelina Morel, DPT 02/10/2022, 4:39 PM

## 2022-02-15 ENCOUNTER — Encounter: Payer: Self-pay | Admitting: Physical Therapy

## 2022-02-15 ENCOUNTER — Ambulatory Visit: Payer: Medicare Other | Admitting: Occupational Therapy

## 2022-02-15 ENCOUNTER — Encounter: Payer: Self-pay | Admitting: Occupational Therapy

## 2022-02-15 ENCOUNTER — Ambulatory Visit: Payer: Medicare Other | Admitting: Physical Therapy

## 2022-02-15 DIAGNOSIS — D329 Benign neoplasm of meninges, unspecified: Secondary | ICD-10-CM | POA: Diagnosis not present

## 2022-02-15 DIAGNOSIS — M25511 Pain in right shoulder: Secondary | ICD-10-CM | POA: Diagnosis not present

## 2022-02-15 DIAGNOSIS — M6281 Muscle weakness (generalized): Secondary | ICD-10-CM | POA: Diagnosis not present

## 2022-02-15 DIAGNOSIS — R278 Other lack of coordination: Secondary | ICD-10-CM

## 2022-02-15 DIAGNOSIS — R2689 Other abnormalities of gait and mobility: Secondary | ICD-10-CM | POA: Diagnosis not present

## 2022-02-15 DIAGNOSIS — R29898 Other symptoms and signs involving the musculoskeletal system: Secondary | ICD-10-CM | POA: Diagnosis not present

## 2022-02-15 DIAGNOSIS — R2681 Unsteadiness on feet: Secondary | ICD-10-CM

## 2022-02-15 NOTE — Therapy (Signed)
OUTPATIENT PHYSICAL THERAPY NEURO EVALUATION   Patient Name: Timothy Moran MRN: [id number] DOB:Sep 16, 1945, 76 y.o., male Today's Date: 02/15/2022   PCP: Cari Caraway REFERRING PROVIDER: Barbie Banner, PA-C    PT End of Session - 02/15/22 0930     Visit Number 2    Date for PT Re-Evaluation 04/21/22    PT Start Time 0930    PT Stop Time 1027    PT Time Calculation (min) 45 min    Activity Tolerance Patient tolerated treatment well    Behavior During Therapy [facility] for tasks assessed/performed             Past Medical History:  Diagnosis Date   Acute blood loss anemia 10/05/2020   Acute respiratory failure ([place]), hypothermia therapy, vent - extubated 10/06/20    AKI (acute kidney injury) ([place]) 10/05/2020   Anoxic brain injury ([place]) 10/26/2020   Arrhythmia    Ascending aortic aneurysm ([place]) 01/29/2018   a. 2019 4.3 cm by echo; b. 02/2021 Echo: Ao root 36m.   Benign brain tumor (H[place]roy[place]ondo    Benign neoplasm of brain (HMog[place] 12/19/2017   Cardiac arrest with ventricular fibrillation (HNor[place]    Chest pain 12/04/2017   Chronic kidney disease, stage III (moderate) (HForm[place] 12/19/2017   CKD (chronic kidney disease), stage III - IV (HCC)    Coronary Artery Disease    a. 12/5366Inf STEMI complicated by cardiac arrest/PCI: LAD 50p/m, LCX 100d (2.5 x 30 Resolute Onyx DES); b. 04/2021 PCI: 04/2021 LM nl, LAD 50p/838m2.5x30 Onyx Frontier DES), LCX 25p, 5059mFR 0.98), 40d ISR (RFR 0.98), OM2 25, RCA mild diff dzs.   Diabetes mellitus, type 2 (HCCLake Crystal1/25/2022   10/06/20 A1C 7%   Dizziness 10/11/2019   Encounter for imaging study to confirm orogastric (OG) tube placement    Hematuria 10/26/2020   HFrEF (heart failure with reduced ejection fraction) (HCCLake Bar[place]  a. 11/2020 Echo: EF 25-30%; b. 02/2021 Echo: EF 25-30%, glob HK. Mild LVH. Nl RV fxn. Triv AI. Ao root 57m45m History of pulmonary embolus (PE)    HLD (hyperlipidemia) 10/26/2020   Hypertension    Iron deficiency anemia rec'd IV iron  10/26/2020   Ischemic cardiomyopathy    a. 11/2020 Echo: EF 25-30%; b. 02/2021 Echo: EF 25-30%, glob HK.   Left bundle branch block 12/19/2017   Meningioma (HCC)[place]/11/2019   Nonsustained ventricular tachycardia (HCC)[place]/29/2021   Pain of left hip joint 05/29/2020   Personal history of pulmonary embolism 12/19/2017   Pneumonia of both lungs due to infectious organism    Pulmonary hypertension due to thromboembolism (HCC)[place]/28/2019   See echo 12/27/17 with nl PAS vs CTa chest 02/23/18    S/P angioplasty with stent 10/04/20 DES to LCX  10/26/2020   Sinus bradycardia 12/22/2017   SOB (shortness of breath)    Solitary pulmonary nodule on lung CT 03/08/2018   CT 12/04/17 1.0 x 0.8 x 0.7 cm nodular opacity in the RUL vs not seen 03/16/10 posterior segment of the right upper lobe. .SpiMarland Kitchenometry 03/08/2018    FEV1 3.86 (108%)  Ratio 96 s prior rx  - PET  03/13/18   Low grade c/w adenoca > rec T surgery eval     STEMI (ST elevation myocardial infarction) (HCC)[place]/23/2022   Urinary retention 10/26/2020   Vestibular schwannoma (HCC)[place]/11/2019   Past Surgical History:  Procedure Laterality Date   APPENDECTOMY     BIV ICD INSERTION CRT-D N/A 11/03/2021   Procedure: BIV  ICD INSERTION CRT-D;  Surgeon: Constance Haw, MD;  Location: [place] [facility];  Service: Cardiovascular;  Laterality: N/A;   CARDIAC CATHETERIZATION     CORONARY ANGIOPLASTY WITH STENT PLACEMENT Left 04/12/2021   LAD stent placement   CORONARY STENT INTERVENTION N/A 04/12/2021   Procedure: CORONARY STENT INTERVENTION;  Surgeon: Nelva Bush, MD;  Location: [place] [facility];  Service: Cardiovascular;  Laterality: N/A;   CORONARY/GRAFT ACUTE MI REVASCULARIZATION N/A 10/04/2020   Procedure: Coronary/Graft Acute MI Revascularization;  Surgeon: Nelva Bush, MD;  Location: [place] [facility] LAB;  Service: Cardiovascular;  Laterality: N/A;   HERNIA REPAIR     INTRAVASCULAR PRESSURE WIRE/FFR STUDY N/A 04/12/2021   Procedure:  INTRAVASCULAR PRESSURE WIRE/FFR STUDY;  Surgeon: Nelva Bush, MD;  Location: [place] [facility] LAB;  Service: Cardiovascular;  Laterality: N/A;   INTRAVASCULAR ULTRASOUND/IVUS N/A 04/12/2021   Procedure: Intravascular Ultrasound/IVUS;  Surgeon: Nelva Bush, MD;  Location: [place] [facility] LAB;  Service: Cardiovascular;  Laterality: N/A;   LEFT HEART CATH AND CORONARY ANGIOGRAPHY N/A 10/04/2020   Procedure: LEFT HEART CATH AND CORONARY ANGIOGRAPHY;  Surgeon: Nelva Bush, MD;  Location: [place] [facility] LAB;  Service: Cardiovascular;  Laterality: N/A;   RIGHT/LEFT HEART CATH AND CORONARY ANGIOGRAPHY N/A 04/12/2021   Procedure: RIGHT/LEFT HEART CATH AND CORONARY ANGIOGRAPHY;  Surgeon: Nelva Bush, MD;  Location: [place] [facility] LAB;  Service: Cardiovascular;  Laterality: N/A;   Patient Active Problem List   Diagnosis Date Noted   Anemia    Leukocytosis    Right arm weakness 01/25/2022   Right sided weakness    Fall at home 01/22/2022   Status post biventricular pacemaker - MRI compatible PPM and leads 01/22/2022   Chronic indwelling Foley catheter 01/22/2022   CKD (chronic kidney disease), stage III - IV ([place]) 06/28/2021   Chronic HFrEF (heart failure with reduced ejection fraction) ([place]) 03/31/2021   Arrhythmia    DM (diabetes mellitus), type 2 ([place]) new 10/26/2020   Urinary retention 10/26/2020   Hyperlipidemia LDL goal <70 10/26/2020   Coronary artery disease of native artery of native heart with stable angina pectoris ([place]) 10/26/2020   S/P angioplasty with stent 10/04/20 DES to LCX  10/26/2020   Ischemic cardiomyopathy 10/26/2020   Diabetes mellitus, type 2 ([place]) 10/06/2020   DOE (dyspnea on exertion)    Meningioma ([place]) 08/14/2020   Vestibular schwannoma ([place]) 08/14/2020   Cardiac arrest with ventricular fibrillation (HCC)    Pain of left hip joint 05/29/2020   Benign brain tumor ([place])    Chronic kidney disease    History of pulmonary embolus (PE)    Hypertension     Nonsustained ventricular tachycardia ([place]) 10/11/2019   Dizziness 10/11/2019   Abnormal echocardiogram 06/07/2019   Pulmonary hypertension due to thromboembolism ([place]) 03/09/2018   Solitary pulmonary nodule on lung CT 03/08/2018   Ascending aortic aneurysm (HCC) 4.2 cm based on CT from December 2020 01/29/2018   Sinus bradycardia 12/22/2017   Left bundle branch block 12/19/2017   Personal history of pulmonary embolism - no longer on anticoagulation due to pt refusal to take it anymore. 12/19/2017   Chronic kidney disease, stage III (moderate) ([place]) 12/19/2017   Benign neoplasm of brain ([place]) 12/19/2017    ONSET DATE: 02/01/2022   REFERRING DIAG: Diagnosis R53.1 (ICD-10-CM) - Right sided weakness D32.9 (ICD-10-CM) - Meningioma (HCC)   THERAPY DIAG:  Right shoulder pain, unspecified chronicity  Other lack of coordination  Muscle weakness (generalized)  Unsteadiness on feet  Rationale for  Evaluation and Treatment Rehabilitation  SUBJECTIVE:                                                                                                                                                                                              SUBJECTIVE STATEMENT: Feeling ok today Pt accompanied by: self and significant other  PERTINENT HISTORY: Brief HPI:   Onofrio Klemp is a 76 y.o. male who fell backward striking his head and right neck shoulder area on 01/21/2022. He complained of right hemiparesis. CT head revealed left parafalcine meningioma.Started on Decadron with improvement in symptoms   PAIN:  Are you having pain? No  PRECAUTIONS: None  WEIGHT BEARING RESTRICTIONS No  FALLS: Has patient fallen in last 6 months? Yes. Number of falls 1  LIVING ENVIRONMENT: Lives with: lives with their spouse Lives in: House/apartment Stairs: Yes: External: 7 steps; bilateral but cannot reach both Has following equipment at home: Walker - 2 wheeled  PLOF: [place]  Patient would like to return to normal. Able to participate in all activities, he was not limited before.  OBJECTIVE:  MUSCLE LENGTH: Hamstrings: Right 72 deg; Left 78 deg Eval   POSTURE: rounded shoulders  LOWER EXTREMITY ROM:   PROM WFL in LE   LOWER EXTREMITY MMT:    MMT Right Eval Left Eval  Hip flexion 4 4  Hip extension 4- 4  Hip abduction 4- 4  Hip adduction    Hip internal rotation    Hip external rotation    Knee flexion 4 4  Knee extension 4 4  Ankle dorsiflexion 4 4  Ankle plantarflexion    Ankle inversion    Ankle eversion    (Blank rows = not tested)   FUNCTIONAL TESTs:  EVAL 5 times sit to stand: 16.97 Timed up and go (TUG): 14.79 Functional gait assessment: 13/30  TODAY'S TREATMENT:  Tx. 02/15/2022 NuStep L5 x6 min  Sit to stands UE assist elevated mat 2x10  4in step HHA x1 2x10 Rows green 2x15 Shoulder Extensions green 2x15 Leg curls 35lb 2x10   Leg Ext 10lb 2x10   Bridges x10 x5    SLR 2x10 each    Hooklying ball squeeze x15   PATIENT EDUCATION: Education details: POC Person educated: Patient and Spouse Education method: Explanation Education comprehension: verbalized understanding   HOME EXERCISE PROGRAM: TBD    GOALS: Goals reviewed with patient? Yes  SHORT TERM GOALS: Target date: 03/10/2022  I with initial HEP Baseline: Goal status: Progressing   2.  FGA improved to 16/30 Baseline: 13 Goal status: INITIAL   LONG TERM GOALS: Target date: 04/21/2022  I with final HEP Baseline:  Goal status: INITIAL  2.  Paitent will complete 5x STS in < 12 seconds to demosntrate improved balance and LE strength. Baseline: 16.9 with unsteadiness each time he rises. Goal status: INITIAL  3.  Patient will ambulate x at least 1000' on level and unlevel surfaces I, no loss of balance or unsteadiness noted. Baseline: Ataxic, unsteady, 100' Goal status: INITIAL  4.  Complete TUG in < 10 sec to demosntrate improved balance Baseline:  14.79 Goal status: INITIAL  5.  Patient will score at least 21/27 Baseline: 13/27-Patient cannot perform heel toe gait. Goal status: INITIAL    ASSESSMENT:  CLINICAL IMPRESSION: Pt enters clinic after OT feeling well. Pt doe have a decrease activity tolerance requiring increase rest after activity. Weakness present with sit to stands and step ups. CGA needed at times with step ups due to instability. No reports of pain reported during session. Cue not to allow R hip to externally rotate with SLR on L side.   OBJECTIVE IMPAIRMENTS Abnormal gait, decreased balance, decreased coordination, decreased endurance, decreased mobility, difficulty walking, decreased ROM, decreased strength, impaired flexibility, improper body mechanics, and postural dysfunction.   ACTIVITY LIMITATIONS carrying, lifting, squatting, stairs, transfers, reach over head, and locomotion level  PARTICIPATION LIMITATIONS: cleaning, laundry, shopping, and yard work  [place] Past/current experiences and 1 comorbidity: L meningioma  are also affecting patient's functional outcome.   REHAB POTENTIAL: Good  CLINICAL DECISION MAKING: Evolving/moderate complexity  EVALUATION COMPLEXITY: Moderate  PLAN: PT FREQUENCY: 2x/week  PT DURATION: 10 weeks  PLANNED INTERVENTIONS: Therapeutic exercises, Therapeutic activity, Neuromuscular re-education, Balance training, Gait training, Patient/Family education, Joint mobilization, Stair training, and Manual therapy  PLAN FOR NEXT SESSION: Initiate HEP for stretch, strengthening, balance training   Marcelina Morel, DPT 02/15/2022, 9:30 AM

## 2022-02-15 NOTE — Therapy (Signed)
OUTPATIENT OCCUPATIONAL THERAPY TREATMENT NOTE   Patient Name: Timothy Moran MRN: [id number] DOB:09/28/1945, 76 y.o., male Today's Date: 02/15/2022  PCP: Cari Caraway, MD REFERRING PROVIDER: Barbie Banner, PA-C   END OF SESSION:   OT End of Session - 02/15/22 0904     Visit Number 2    Date for OT Re-Evaluation 05/11/22    Authorization Type Medicare    Authorization Time Period VL: [place]    Progress Note Due on Visit 10    OT Start Time 0845    OT Stop Time 0925    OT Time Calculation (min) 40 min    Activity Tolerance Patient tolerated treatment well    Behavior During Therapy [facility] for tasks assessed/performed            Past Medical History:  Diagnosis Date   Acute blood loss anemia 10/05/2020   Acute respiratory failure ([place]), hypothermia therapy, vent - extubated 10/06/20    AKI (acute kidney injury) ([place]) 10/05/2020   Anoxic brain injury ([place]) 10/26/2020   Arrhythmia    Ascending aortic aneurysm ([place]) 01/29/2018   a. 2019 4.3 cm by echo; b. 02/2021 Echo: Ao root 59m.   Benign brain tumor ([place]    Benign neoplasm of brain ([place] 12/19/2017   Cardiac arrest with ventricular fibrillation ([place]    Chest pain 12/04/2017   Chronic kidney disease, stage III (moderate) ([place] 12/19/2017   CKD (chronic kidney disease), stage III - IV (HCC)    Coronary Artery Disease    a. 11/4431Inf STEMI complicated by cardiac arrest/PCI: LAD 50p/m, LCX 100d (2.5 x 30 Resolute Onyx DES); b. 04/2021 PCI: 04/2021 LM nl, LAD 50p/876m2.5x30 Onyx Frontier DES), LCX 25p, 5059mFR 0.98), 40d ISR (RFR 0.98), OM2 25, RCA mild diff dzs.   Diabetes mellitus, type 2 ([place]CC[place]1/25/2022   10/06/20 A1C 7%   Dizziness 10/11/2019   Encounter for imaging study to confirm orogastric (OG) tube placement    Hematuria 10/26/2020   HFrEF (heart failure with reduced ejection fraction) ([place]  a. 11/2020 Echo: EF 25-30%; b. 02/2021 Echo: EF 25-30%, glob HK. Mild LVH. Nl RV fxn. Triv AI. Ao root 22m11m History  of pulmonary embolus (PE)    HLD (hyperlipidemia) 10/26/2020   Hypertension    Iron deficiency anemia rec'd IV iron 10/26/2020   Ischemic cardiomyopathy    a. 11/2020 Echo: EF 25-30%; b. 02/2021 Echo: EF 25-30%, glob HK.   Left bundle branch block 12/19/2017   Meningioma (HCC)[place]/11/2019   Nonsustained ventricular tachycardia (HCC)[place]/29/2021   Pain of left hip joint 05/29/2020   Personal history of pulmonary embolism 12/19/2017   Pneumonia of both lungs due to infectious organism    Pulmonary hypertension due to thromboembolism (HCC)[place]/28/2019   See echo 12/27/17 with nl PAS vs CTa chest 02/23/18    S/P angioplasty with stent 10/04/20 DES to LCX  10/26/2020   Sinus bradycardia 12/22/2017   SOB (shortness of breath)    Solitary pulmonary nodule on lung CT 03/08/2018   CT 12/04/17 1.0 x 0.8 x 0.7 cm nodular opacity in the RUL vs not seen 03/16/10 posterior segment of the right upper lobe. .Spi[place]land Kitchenometry 03/08/2018    FEV1 3.86 (108%)  Ratio 96 s prior rx  - PET  03/13/18   Low grade c/w adenoca > rec T surgery eval     STEMI (ST elevation myocardial infarction) (HCC)[place]/23/2022   Urinary retention 10/26/2020   Vestibular schwannoma (HCC)[place]/11/2019  Past Surgical History:  Procedure Laterality Date   APPENDECTOMY     BIV ICD INSERTION CRT-D N/A 11/03/2021   Procedure: BIV ICD INSERTION CRT-D;  Surgeon: Constance Haw, MD;  Location: [facility];  Service: Cardiovascular;  Laterality: N/A;   CARDIAC CATHETERIZATION     CORONARY ANGIOPLASTY WITH STENT PLACEMENT Left 04/12/2021   LAD stent placement   CORONARY STENT INTERVENTION N/A 04/12/2021   Procedure: CORONARY STENT INTERVENTION;  Surgeon: Nelva Bush, MD;  Location: [facility];  Service: Cardiovascular;  Laterality: N/A;   CORONARY/GRAFT ACUTE MI REVASCULARIZATION N/A 10/04/2020   Procedure: Coronary/Graft Acute MI Revascularization;  Surgeon: Nelva Bush, MD;  Location: [facility];  Service:  Cardiovascular;  Laterality: N/A;   HERNIA REPAIR     INTRAVASCULAR PRESSURE WIRE/FFR STUDY N/A 04/12/2021   Procedure: INTRAVASCULAR PRESSURE WIRE/FFR STUDY;  Surgeon: Nelva Bush, MD;  Location: [facility];  Service: Cardiovascular;  Laterality: N/A;   INTRAVASCULAR ULTRASOUND/IVUS N/A 04/12/2021   Procedure: Intravascular Ultrasound/IVUS;  Surgeon: Nelva Bush, MD;  Location: [facility];  Service: Cardiovascular;  Laterality: N/A;   LEFT HEART CATH AND CORONARY ANGIOGRAPHY N/A 10/04/2020   Procedure: LEFT HEART CATH AND CORONARY ANGIOGRAPHY;  Surgeon: Nelva Bush, MD;  Location: [facility];  Service: Cardiovascular;  Laterality: N/A;   RIGHT/LEFT HEART CATH AND CORONARY ANGIOGRAPHY N/A 04/12/2021   Procedure: RIGHT/LEFT HEART CATH AND CORONARY ANGIOGRAPHY;  Surgeon: Nelva Bush, MD;  Location: [facility];  Service: Cardiovascular;  Laterality: N/A;   Patient Active Problem List   Diagnosis Date Noted   Anemia    Leukocytosis    Right arm weakness 01/25/2022   Right sided weakness    Fall at home 01/22/2022   Status post biventricular pacemaker - MRI compatible PPM and leads 01/22/2022   Chronic indwelling Foley catheter 01/22/2022   CKD (chronic kidney disease), stage III - IV ([place]) 06/28/2021   Chronic HFrEF (heart failure with reduced ejection fraction) ([place]) 03/31/2021   Arrhythmia    DM (diabetes mellitus), type 2 ([place]) new 10/26/2020   Urinary retention 10/26/2020   Hyperlipidemia LDL goal <70 10/26/2020   Coronary artery disease of native artery of native heart with stable angina pectoris ([place]) 10/26/2020   S/P angioplasty with stent 10/04/20 DES to LCX  10/26/2020   Ischemic cardiomyopathy 10/26/2020   Diabetes mellitus, type 2 ([place]) 10/06/2020   DOE (dyspnea on exertion)    Meningioma ([place]) 08/14/2020   Vestibular schwannoma ([place]) 08/14/2020   Cardiac arrest with ventricular fibrillation (HCC)    Pain of left hip joint  05/29/2020   Benign brain tumor ([place])    Chronic kidney disease    History of pulmonary embolus (PE)    Hypertension    Nonsustained ventricular tachycardia ([place]) 10/11/2019   Dizziness 10/11/2019   Abnormal echocardiogram 06/07/2019   Pulmonary hypertension due to thromboembolism ([place]) 03/09/2018   Solitary pulmonary nodule on lung CT 03/08/2018   Ascending aortic aneurysm (HCC) 4.2 cm based on CT from December 2020 01/29/2018   Sinus bradycardia 12/22/2017   Left bundle branch block 12/19/2017   Personal history of pulmonary embolism - no longer on anticoagulation due to pt refusal to take it anymore. 12/19/2017   Chronic kidney disease, stage III (moderate) ([place]) 12/19/2017   Benign neoplasm of brain ([place]) 12/19/2017    ONSET DATE: 02/01/22 (date of OT order)  REFERRING DIAG: R53.1 (ICD-10-CM) - Right sided weakness D32.9 (ICD-10-CM) - Meningioma ([place])  THERAPY DIAG:  Right shoulder pain, unspecified chronicity  Other lack of coordination  Muscle weakness (generalized)  Rationale for Evaluation and Treatment Rehabilitation  SUBJECTIVE:   SUBJECTIVE STATEMENT: Pt reports initial appt w/ Dr. Onnie Graham [facility]) is this afternoon and he is continuing to experience no pain in his shoulder; also states his R hand felt "different" than his L hand after completing [place] activity Pt accompanied by: self and significant other  PERTINENT HISTORY: Presented to ED s/p fall on 01/21/22 during which he hit the back of his head and R neck/shoulder area w/ R hemiparesis; CT revealed L parafalcine meningioma w/ associated vasogenic edema; pt w/ known h/o meningioma being followed by [facility]  MRI on 02/01/22 indicated R shoulder RTC full thickness supraspinatus tear; partial tear of infraspinatus and subscapularis; severe AC OA and moderate GH OA  PMH: biventricular ICD placement (11/03/21); CAD w/ coronary stent intervention Aug 3846; STEMI complicated by cardiac arrest w/  ventricular fibrillation and anoxic BI Jan 2022; HFrEF, HTN, HLD, PE, aortic aneurysm, CKD stage III, chronic indwelling foley catheter, DMT2  PRECAUTIONS: Fall; no driving due to R-sided weakness  WEIGHT BEARING RESTRICTIONS: No  PAIN: Are you having pain? No  PATIENT GOALS: Improve strength and balance   OBJECTIVE:   TODAY'S TREATMENT:  Attempted closed-chain AAROM of shoulder flexion to chest height holding unweighted dowel; pt able to complete w/out pain. OT provided min tactile facilitation to decrease compensatory shoulder hiking and palpated significant crepitus. Exercise was d/c; to revisit after pt's follow up w/ ortho MD AROM of single-arm R shoulder ER w/ OT providing occ verbal/tactile cues for alignment/positioning; completed 2x15 w/out increased pain or other difficulty Using R hand to pick up 1" blocks off tabletop and place into a stack with verbal cues for slow/controlled movements prn. OT graded task up by having pt tap top of tower w/ R index finger w/out knocking tower over to address unilateral [place]; increased height of tower for increased difficulty prn Coordination activity w/ RUE: picking up 5 coins 1 at a time and translating palm to fingertips to place in a stack of 10; completed w/ dimes, pennies, nickels, and quarters Shoulder flex/ext pendulum exercise completed slowly w/ LUE countertop support at CGA; OT provided mod verbal/tactile cues to decrease active RUE movement and focus on achieving movement w/ weight shifting   PATIENT EDUCATION: OT provided condition-specific education, including typical RTC tear precautions and considerations Person educated: Patient Education method: Explanation Education comprehension: verbalized understanding   HOME EXERCISE PROGRAM: MedBridge Code: [id number] - Flexion-Extension Shoulder Pendulum with Table Support  - 3 x daily - 2 sets - 15 reps   GOALS: Goals reviewed with patient? Yes  SHORT TERM GOALS: Target date:  03/25/22  STG  Status:  1 Pt will demonstrate independence for initial HEP designed for ROM within designated precautions Baseline: No HEP at this time Progressing   2 Pt will verbalize understanding of compensatory strategies for memory and attention to improve safety w/ IADL tasks Baseline: 89% accuracy w/ double number cancellation task Progressing     LONG TERM GOALS: Target date: 05/06/22  LTG  Status:  1 Pt will demonstrate strength of R shoulder flexion to at least 4-/5 for improved functional forward and overhead reach Baseline: Unable to assess at evaluation Progressing  2 Pt will decrease time to complete Trail Making Test: Part B w/ no errors by d/c to indicate improved processing and alternating attention Baseline: 1 min, 40.6 sec Progressing  3 Pt  will improve score of UEFI to at least 75/80 to indicate improved functional use of BUEs Baseline: 68/80 Progressing  4 Pt will be able to lift medium-weight object (~10 lbs) w/ BUEs and no reported pain by d/c Baseline: Lifting precautions/restrictions Progressing  5 Pt will demonstrate improved RUE GMC and coordination as evidenced by increasing accuracy w/ finger-to-nose test within 20 sec Baseline: 15x w/ 2 errors Progressing     ASSESSMENT:  CLINICAL IMPRESSION: Pt arrives for first treatment session following initial evaluation on 02/10/22. He continues to endorse no pain in his R shoulder despite significant MRI findings, but OT continued w/ conservative management this session until pt follows up w/ ortho MD later today. OT completed light scapular stability exercises, pendulum exercises for passive range of the affected shoulder, and focused on [place] and coordination. Observable increased success w/ targeting of RUE compared to evaluation session previous week w/ OT encouraging pt to continue to use RUE for various functional tasks as much as able and w/out pain.  PERFORMANCE DEFICITS in functional skills including ADLs, IADLs,  coordination, dexterity, sensation, ROM, strength, pain, FMC, GMC, mobility, balance, body mechanics, endurance, cardiopulmonary status limiting function, decreased knowledge of precautions, decreased knowledge of use of DME, and UE functional use, cognitive skills including safety awareness, and psychosocial skills including environmental adaptation.   IMPAIRMENTS are limiting patient from ADLs, IADLs, and leisure.   COMORBIDITIES has co-morbidities such as h/o meningioma, biventricular ICD insertion, CAD w/ stent intervention, and h/o MI and cardiac arrest  that affects occupational performance. Patient will benefit from skilled OT to address above impairments and improve overall function.   PLAN: OT FREQUENCY: 1-2x/week (TBD depending on ortho recommendation)  OT DURATION: 10 weeks  PLANNED INTERVENTIONS: self care/ADL training, therapeutic exercise, therapeutic activity, neuromuscular re-education, manual therapy, passive range of motion, balance training, functional mobility training, aquatic therapy, splinting, moist heat, cryotherapy, patient/family education, visual/perceptual remediation/compensation, energy conservation, and DME and/or AE instructions  RECOMMENDED OTHER SERVICES: Currently receiving PT services at this location  CONSULTED AND AGREED WITH PLAN OF CARE: Patient and family member/caregiver  PLAN FOR NEXT SESSION: TBD based on protocol/treatment course determined by ortho MD   Kathrine Cords, MSOT, OTR/L 02/15/2022, 9:30 AM

## 2022-02-17 ENCOUNTER — Ambulatory Visit: Payer: Medicare Other | Admitting: Physical Therapy

## 2022-02-17 ENCOUNTER — Encounter: Payer: Self-pay | Admitting: Physical Therapy

## 2022-02-17 ENCOUNTER — Telehealth: Payer: Self-pay | Admitting: Cardiology

## 2022-02-17 DIAGNOSIS — R278 Other lack of coordination: Secondary | ICD-10-CM | POA: Diagnosis not present

## 2022-02-17 DIAGNOSIS — R29898 Other symptoms and signs involving the musculoskeletal system: Secondary | ICD-10-CM

## 2022-02-17 DIAGNOSIS — M25511 Pain in right shoulder: Secondary | ICD-10-CM

## 2022-02-17 DIAGNOSIS — R2681 Unsteadiness on feet: Secondary | ICD-10-CM

## 2022-02-17 DIAGNOSIS — R2689 Other abnormalities of gait and mobility: Secondary | ICD-10-CM | POA: Diagnosis not present

## 2022-02-17 DIAGNOSIS — D329 Benign neoplasm of meninges, unspecified: Secondary | ICD-10-CM | POA: Diagnosis not present

## 2022-02-17 DIAGNOSIS — R531 Weakness: Secondary | ICD-10-CM

## 2022-02-17 DIAGNOSIS — Y92009 Unspecified place in unspecified non-institutional (private) residence as the place of occurrence of the external cause: Secondary | ICD-10-CM

## 2022-02-17 DIAGNOSIS — M6281 Muscle weakness (generalized): Secondary | ICD-10-CM

## 2022-02-17 NOTE — Telephone Encounter (Signed)
   Pre-operative Risk Assessment    Patient Name: Timothy Moran  DOB: [birth year] MRN: [id number]      Request for Surgical Clearance    Procedure:   [place]   Date of Surgery:  Clearance 02/21/22                                 Surgeon:  Link Snuffer Surgeon's Group or Practice Name:  [facility]  Phone number:  [phone] Fax number:  [phone]   Type of Clearance Requested:   - Medical  and Pharmacy (needs to hold aspirin)  5. What type of anesthesia will be used?  Press F2 and select the anesthesia to be used for the procedure.  :1}  Type of Anesthesia:   Nitrose   Additional requests/questions:    Dorthey Sawyer   02/17/2022, 2:24 PM

## 2022-02-17 NOTE — Therapy (Signed)
OUTPATIENT PHYSICAL THERAPY NEURO EVALUATION   Patient Name: Timothy Moran MRN: [id number] DOB:[birth year], 76 y.o., male Today's Date: 02/17/2022   PCP: [place], [place] PROVIDER: Barbie Banner, PA-C    PT End of Session - 02/17/22 1012     Visit Number 3    Date for PT Re-Evaluation 04/21/22    PT Start Time 0930    PT Stop Time 1008    PT Time Calculation (min) 38 min    Activity Tolerance Patient tolerated treatment well    Behavior During Therapy [facility] for tasks assessed/performed              Past Medical History:  Diagnosis Date   Acute blood loss anemia 10/05/2020   Acute respiratory failure ([place]), hypothermia therapy, vent - extubated 10/06/20    AKI (acute kidney injury) ([place]) 10/05/2020   Anoxic brain injury ([place]) 10/26/2020   Arrhythmia    Ascending aortic aneurysm ([place]) 01/29/2018   a. 2019 4.3 cm by echo; b. 02/2021 Echo: Ao root 42m.   Benign brain tumor ([place]    Benign neoplasm of brain ([place] 12/19/2017   Cardiac arrest with ventricular fibrillation ([place]    Chest pain 12/04/2017   Chronic kidney disease, stage III (moderate) ([place] 12/19/2017   CKD (chronic kidney disease), stage III - IV (HCC)    Coronary Artery Disease    a. 17/8588Inf STEMI complicated by cardiac arrest/PCI: LAD 50p/m, LCX 100d (2.5 x 30 Resolute Onyx DES); b. 04/2021 PCI: 04/2021 LM nl, LAD 50p/826m2.5x30 Onyx Frontier DES), LCX 25p, 5026mFR 0.98), 40d ISR (RFR 0.98), OM2 25, RCA mild diff dzs.   Diabetes mellitus, type 2 ([place]CC[place]aqu[place]1/25/2022   10/06/20 A1C 7%   Dizziness 10/11/2019   Encounter for imaging study to confirm orogastric (OG) tube placement    Hematuria 10/26/2020   HFrEF (heart failure with reduced ejection fraction) ([place]  a. 11/2020 Echo: EF 25-30%; b. 02/2021 Echo: EF 25-30%, glob HK. Mild LVH. Nl RV fxn. Triv AI. Ao root 92m15m History of pulmonary embolus (PE)    HLD (hyperlipidemia) 10/26/2020   Hypertension    Iron deficiency anemia rec'd IV iron  10/26/2020   Ischemic cardiomyopathy    a. 11/2020 Echo: EF 25-30%; b. 02/2021 Echo: EF 25-30%, glob HK.   Left bundle branch block 12/19/2017   Meningioma (HCC)[place]/11/2019   Nonsustained ventricular tachycardia (HCC)[place]/29/2021   Pain of left hip joint 05/29/2020   Personal history of pulmonary embolism 12/19/2017   Pneumonia of both lungs due to infectious organism    Pulmonary hypertension due to thromboembolism (HCC)[place]/28/2019   See echo 12/27/17 with nl PAS vs CTa chest 02/23/18    S/P angioplasty with stent 10/04/20 DES to LCX  10/26/2020   Sinus bradycardia 12/22/2017   SOB (shortness of breath)    Solitary pulmonary nodule on lung CT 03/08/2018   CT 12/04/17 1.0 x 0.8 x 0.7 cm nodular opacity in the RUL vs not seen 03/16/10 posterior segment of the right upper lobe. .Spi[place] Kitchenometry 03/08/2018    FEV1 3.86 (108%)  Ratio 96 s prior rx  - PET  03/13/18   Low grade c/w adenoca > rec T surgery eval     STEMI (ST elevation myocardial infarction) (HCC)[place]/23/2022   Urinary retention 10/26/2020   Vestibular schwannoma (HCC)[place]/11/2019   Past Surgical History:  Procedure Laterality Date   APPENDECTOMY     BIV ICD INSERTION CRT-D N/A 11/03/2021   Procedure:  BIV ICD INSERTION CRT-D;  Surgeon: Constance Haw, MD;  Location: [place] CV LAB;  Service: Cardiovascular;  Laterality: N/A;   CARDIAC CATHETERIZATION     CORONARY ANGIOPLASTY WITH STENT PLACEMENT Left 04/12/2021   LAD stent placement   CORONARY STENT INTERVENTION N/A 04/12/2021   Procedure: CORONARY STENT INTERVENTION;  Surgeon: Nelva Bush, MD;  Location: [place] CV LAB;  Service: Cardiovascular;  Laterality: N/A;   CORONARY/GRAFT ACUTE MI REVASCULARIZATION N/A 10/04/2020   Procedure: Coronary/Graft Acute MI Revascularization;  Surgeon: Nelva Bush, MD;  Location: [place] CV LAB;  Service: Cardiovascular;  Laterality: N/A;   HERNIA REPAIR     INTRAVASCULAR PRESSURE WIRE/FFR STUDY N/A 04/12/2021   Procedure:  INTRAVASCULAR PRESSURE WIRE/FFR STUDY;  Surgeon: Nelva Bush, MD;  Location: [place] CV LAB;  Service: Cardiovascular;  Laterality: N/A;   INTRAVASCULAR ULTRASOUND/IVUS N/A 04/12/2021   Procedure: Intravascular Ultrasound/IVUS;  Surgeon: Nelva Bush, MD;  Location: [place] CV LAB;  Service: Cardiovascular;  Laterality: N/A;   LEFT HEART CATH AND CORONARY ANGIOGRAPHY N/A 10/04/2020   Procedure: LEFT HEART CATH AND CORONARY ANGIOGRAPHY;  Surgeon: Nelva Bush, MD;  Location: [place] CV LAB;  Service: Cardiovascular;  Laterality: N/A;   RIGHT/LEFT HEART CATH AND CORONARY ANGIOGRAPHY N/A 04/12/2021   Procedure: RIGHT/LEFT HEART CATH AND CORONARY ANGIOGRAPHY;  Surgeon: Nelva Bush, MD;  Location: [place] CV LAB;  Service: Cardiovascular;  Laterality: N/A;   Patient Active Problem List   Diagnosis Date Noted   Anemia    Leukocytosis    Right arm weakness 01/25/2022   Right sided weakness    Fall at home 01/22/2022   Status post biventricular pacemaker - MRI compatible PPM and leads 01/22/2022   Chronic indwelling Foley catheter 01/22/2022   CKD (chronic kidney disease), stage III - IV ([place]) 06/28/2021   Chronic HFrEF (heart failure with reduced ejection fraction) ([place]) 03/31/2021   Arrhythmia    DM (diabetes mellitus), type 2 ([place]) new 10/26/2020   Urinary retention 10/26/2020   Hyperlipidemia LDL goal <70 10/26/2020   Coronary artery disease of native artery of native heart with stable angina pectoris ([place]) 10/26/2020   S/P angioplasty with stent 10/04/20 DES to LCX  10/26/2020   Ischemic cardiomyopathy 10/26/2020   Diabetes mellitus, type 2 ([place]) 10/06/2020   DOE (dyspnea on exertion)    Meningioma ([place]) 08/14/2020   Vestibular schwannoma ([place]) 08/14/2020   Cardiac arrest with ventricular fibrillation (HCC)    Pain of left hip joint 05/29/2020   Benign brain tumor ([place])    Chronic kidney disease    History of pulmonary embolus (PE)    Hypertension     Nonsustained ventricular tachycardia ([place]) 10/11/2019   Dizziness 10/11/2019   Abnormal echocardiogram 06/07/2019   Pulmonary hypertension due to thromboembolism ([place]) 03/09/2018   Solitary pulmonary nodule on lung CT 03/08/2018   Ascending aortic aneurysm (HCC) 4.2 cm based on CT from December 2020 01/29/2018   Sinus bradycardia 12/22/2017   Left bundle branch block 12/19/2017   Personal history of pulmonary embolism - no longer on anticoagulation due to pt refusal to take it anymore. 12/19/2017   Chronic kidney disease, stage III (moderate) ([place]) 12/19/2017   Benign neoplasm of brain ([place]) 12/19/2017    ONSET DATE: 02/01/2022   REFERRING DIAG: Diagnosis R53.1 (ICD-10-CM) - Right sided weakness D32.9 (ICD-10-CM) - Meningioma (HCC)   THERAPY DIAG:  Unsteadiness on feet  Right shoulder pain, unspecified chronicity  Other lack of coordination  Muscle weakness (generalized)  Right  sided weakness  Other symptoms and signs involving the musculoskeletal system  Fall in home, initial encounter  Rationale for Evaluation and Treatment Rehabilitation  SUBJECTIVE:                                                                                                                                                                                              SUBJECTIVE STATEMENT: Patient reports no real changes. He went to the Dr for his R shoulder. He has a rotator cuff tear on L, but the Dr prefers to let it heal without intervention.  PERTINENT HISTORY: Brief HPI:   Katie Moch is a 76 y.o. male who fell backward striking his head and right neck shoulder area on 01/21/2022. He complained of right hemiparesis. CT head revealed left parafalcine meningioma.Started on Decadron with improvement in symptoms   PAIN:  Are you having pain? No  PRECAUTIONS: None  WEIGHT BEARING RESTRICTIONS No  FALLS: Has patient fallen in last 6 months? Yes. Number of falls 1  LIVING  ENVIRONMENT: Lives with: lives with their spouse Lives in: House/apartment Stairs: Yes: External: 7 steps; bilateral but cannot reach both Has following equipment at home: Walker - 2 wheeled  PLOF: [place] Patient would like to return to normal. Able to participate in all activities, he was not limited before.  OBJECTIVE:  MUSCLE LENGTH: Hamstrings: Right 72 deg; Left 78 deg Eval   POSTURE: rounded shoulders  LOWER EXTREMITY ROM:   PROM WFL in LE   LOWER EXTREMITY MMT:    MMT Right Eval Left Eval  Hip flexion 4 4  Hip extension 4- 4  Hip abduction 4- 4  Hip adduction    Hip internal rotation    Hip external rotation    Knee flexion 4 4  Knee extension 4 4  Ankle dorsiflexion 4 4  Ankle plantarflexion    Ankle inversion    Ankle eversion    (Blank rows = not tested)   FUNCTIONAL TESTs:  EVAL 5 times sit to stand: 16.97 Timed up and go (TUG): 14.79 Functional gait assessment: 13/30  TODAY'S TREATMENT:    02/17/22   Recumbent bike 4.0 x 6 minutes.   Bridge with hip abd against Red Tband 2 x 10   SLR, SLR with ER, 1 x 10 reps with 2# resistance.   Supine march x 10   Standing sh ext, rows, ER 10 reps each G Tband Standing 3 way kick with UE support, G Tband 10 reps    Tx. 02/15/2022   NuStep L5 x6 min  Sit to stands UE assist elevated mat 2x10  4in step HHA x1 2x10 Rows green 2x15 Shoulder Extensions green 2x15 Leg curls 35lb 2x10   Leg Ext 10lb 2x10   Bridges x10 x5    SLR 2x10 each    Hooklying ball squeeze x15   PATIENT EDUCATION: Education details: HEP, rest between exercises Person educated: patient Education method: Explanation, demo, handout Education comprehension: verbalized understanding   HOME EXERCISE PROGRAM: Access Code: [email or web address] URL: [email or web address] Date: 02/17/2022 Prepared by: Ethel Rana  Exercises - Supine Bridge with Resistance Band  - 1 x daily - 7 x weekly - 2 sets - 10 reps - Supine  Straight Leg Raises  - 1 x daily - 7 x weekly - 2 sets - 10 reps - Straight Leg Raise with External Rotation  - 1 x daily - 7 x weekly - 2 sets - 10 reps - Supine March  - 1 x daily - 7 x weekly - 2 sets - 10 reps - Shoulder Extension with Resistance  - 1 x daily - 7 x weekly - 2 sets - 10 reps - Standing Bilateral Low Shoulder Row with Anchored Resistance  - 1 x daily - 7 x weekly - 2 sets - 10 reps - Shoulder External Rotation and Scapular Retraction with Resistance  - 1 x daily - 7 x weekly - 2 sets - 10 reps - Standing 3-Way Leg Reach with Resistance at Ankles and Counter Support  - 1 x daily - 7 x weekly - 2 sets - 10 reps    GOALS: Goals reviewed with patient? Yes  SHORT TERM GOALS: Target date: 03/10/2022  I with initial HEP Baseline: Initiated Goal status: ongoing   2.  FGA improved to 16/30 Baseline: 13 Goal status: INITIAL   LONG TERM GOALS: Target date: 04/21/2022  I with final HEP Baseline:  Goal status: INITIAL  2.  Paitent will complete 5x STS in < 12 seconds to demosntrate improved balance and LE strength. Baseline: 16.9 with unsteadiness each time he rises. Goal status: INITIAL  3.  Patient will ambulate x at least 1000' on level and unlevel surfaces I, no loss of balance or unsteadiness noted. Baseline: Ataxic, unsteady, 100' Goal status: INITIAL  4.  Complete TUG in < 10 sec to demosntrate improved balance Baseline: 14.79 Goal status: INITIAL  5.  Patient will score at least 21/27 Baseline: 13/27-Patient cannot perform heel toe gait. Goal status: INITIAL    ASSESSMENT:  CLINICAL IMPRESSION: Patient reports no issues. Treatment focused on overall strength and stability, established HEP. Patient very SOB, required rest breaks, he was hesistant to take them.    OBJECTIVE IMPAIRMENTS Abnormal gait, decreased balance, decreased coordination, decreased endurance, decreased mobility, difficulty walking, decreased ROM, decreased strength, impaired  flexibility, improper body mechanics, and postural dysfunction.   ACTIVITY LIMITATIONS carrying, lifting, squatting, stairs, transfers, reach over head, and locomotion level  PARTICIPATION LIMITATIONS: cleaning, laundry, shopping, and yard work  [place] Past/current experiences and 1 comorbidity: L meningioma  are also affecting patient's functional outcome.   REHAB POTENTIAL: Good  CLINICAL DECISION MAKING: Evolving/moderate complexity  EVALUATION COMPLEXITY: Moderate  PLAN: PT FREQUENCY: 2x/week  PT DURATION: 10 weeks  PLANNED INTERVENTIONS: Therapeutic exercises, Therapeutic activity, Neuromuscular re-education, Balance training, Gait training, Patient/Family education, Joint mobilization, Stair training, and Manual therapy  PLAN FOR NEXT SESSION:  strengthening, balance training   Marcelina Morel, DPT 02/17/2022, 10:13 AM

## 2022-02-18 NOTE — Telephone Encounter (Signed)
Per the pre op provider the pt has not been cleared until he has been seen by his cardiologist. I will send notes to requesting office, pt will need appt before he can be cleared.   Will send a message to Dr. Donnetta Hutching scheduling team to see if they can get the pt in for appt for pre op clearance.

## 2022-02-18 NOTE — Telephone Encounter (Signed)
   Name: Timothy Moran  DOB: 12/08/1945  MRN: [id number]  Primary Cardiologist: Jenne Campus, MD  Chart reviewed as part of pre-operative protocol coverage. Because of Timothy Moran's past medical history and time since last visit, he will require a follow-up in-office visit in order to better assess preoperative cardiovascular risk.  Per previous notes, patient was supposed to undergo preoperative cardiac evaluation at outpatient visit on 02/02/2022.  However, it appears that this visit was not completed due to patient being hospitalized at the time.  Patient is post ICD implantation, post recent hospitalization in 01/2022. He has not been seen in office since his procedure or since his hospitalization. Patient will require an outpatient visit prior to proceeding with surgical clearance.  Pre-op covering staff: - Please schedule appointment and call patient to inform them. If patient already had an upcoming appointment within acceptable timeframe, please add "pre-op clearance" to the appointment notes so provider is aware. - Please contact requesting surgeon's office via preferred method (i.e, phone, fax) to inform them of need for appointment prior to surgery.  Lenna Sciara, NP  02/18/2022, 12:34 PM

## 2022-02-21 NOTE — Telephone Encounter (Signed)
Pt has appt 02/22/22 for pre op clearance.

## 2022-02-22 ENCOUNTER — Encounter: Payer: Self-pay | Admitting: Nurse Practitioner

## 2022-02-22 ENCOUNTER — Ambulatory Visit (INDEPENDENT_AMBULATORY_CARE_PROVIDER_SITE_OTHER): Payer: Medicare Other | Admitting: Nurse Practitioner

## 2022-02-22 ENCOUNTER — Ambulatory Visit: Payer: Medicare Other | Admitting: Physical Therapy

## 2022-02-22 VITALS — BP 116/60 | HR 63 | Ht 73.0 in | Wt 219.6 lb

## 2022-02-22 DIAGNOSIS — I5022 Chronic systolic (congestive) heart failure: Secondary | ICD-10-CM

## 2022-02-22 DIAGNOSIS — I10 Essential (primary) hypertension: Secondary | ICD-10-CM | POA: Diagnosis not present

## 2022-02-22 DIAGNOSIS — I447 Left bundle-branch block, unspecified: Secondary | ICD-10-CM

## 2022-02-22 DIAGNOSIS — I7121 Aneurysm of the ascending aorta, without rupture: Secondary | ICD-10-CM | POA: Diagnosis not present

## 2022-02-22 DIAGNOSIS — I255 Ischemic cardiomyopathy: Secondary | ICD-10-CM | POA: Diagnosis not present

## 2022-02-22 DIAGNOSIS — Z01810 Encounter for preprocedural cardiovascular examination: Secondary | ICD-10-CM | POA: Diagnosis not present

## 2022-02-22 DIAGNOSIS — I2510 Atherosclerotic heart disease of native coronary artery without angina pectoris: Secondary | ICD-10-CM | POA: Diagnosis not present

## 2022-02-22 DIAGNOSIS — E119 Type 2 diabetes mellitus without complications: Secondary | ICD-10-CM | POA: Diagnosis not present

## 2022-02-22 DIAGNOSIS — E785 Hyperlipidemia, unspecified: Secondary | ICD-10-CM

## 2022-02-22 NOTE — Patient Instructions (Signed)
Medication Instructions:  Your physician recommends that you continue on your current medications as directed. Please refer to the Current Medication list given to you today.  *If you need a refill on your cardiac medications before your next appointment, please call your pharmacy*   Lab Work: NONE ordered at this time of appointment   If you have labs (blood work) drawn today and your tests are completely normal, you will receive your results only by: [place] (if you have MyChart) OR A paper copy in the mail If you have any lab test that is abnormal or we need to change your treatment, we will call you to review the results.   Testing/Procedures: NONE ordered at this time of appointment     Follow-Up: At [facility], you and your health needs are our priority.  As part of our continuing mission to provide you with exceptional heart care, we have created designated Provider Care Teams.  These Care Teams include your primary Cardiologist (physician) and Advanced Practice Providers (APPs -  Physician Assistants and Nurse Practitioners) who all work together to provide you with the care you need, when you need it.  We recommend signing up for the patient portal called "MyChart".  Sign up information is provided on this After Visit Summary.  MyChart is used to connect with patients for Virtual Visits (Telemedicine).  Patients are able to view lab/test results, encounter notes, upcoming appointments, etc.  Non-urgent messages can be sent to your provider as well.   To learn more about what you can do with MyChart, go to [email or web address].    Your next appointment:   3-4 month(s)  The format for your next appointment:   In Person  Provider:   Jenne Campus, MD     Other Instructions   Important Information About [place]

## 2022-02-22 NOTE — Progress Notes (Signed)
Office Visit    Patient Name: Timothy Moran Date of Encounter: 02/22/2022  Primary Care Provider:  Cari Caraway, MD Primary Cardiologist:  Jenne Campus, MD  Chief Complaint    76 year old male with a history of CAD s/p DES-LAD in 04/2021, ICM s/p ICD, chronic systolic heart failure, LBBB, ascending aortic aneurysm, hypertension, hyperlipidemia, NSVT, meningioma s/p gamma knife radiation, CKD stage III, type 2 diabetes and anemia who presents for follow-up related to CAD, heart failure and for preoperative cardiac evaluation.   Past Medical History    Past Medical History:  Diagnosis Date   Acute blood loss anemia 10/05/2020   Acute respiratory failure ([place]), hypothermia therapy, vent - extubated 10/06/20    AKI (acute kidney injury) ([place]) 10/05/2020   Anoxic brain injury ([place]) 10/26/2020   Arrhythmia    Ascending aortic aneurysm ([place]) 01/29/2018   a. 2019 4.3 cm by echo; b. 02/2021 Echo: Ao root 69m.   Benign brain tumor ([place]    Benign neoplasm of brain ([facility] 12/19/2017   Cardiac arrest with ventricular fibrillation ([place]    Chest pain 12/04/2017   Chronic kidney disease, stage III (moderate) ([place] 12/19/2017   CKD (chronic kidney disease), stage III - IV (HCC)    Coronary Artery Disease    a. 11/9379Inf STEMI complicated by cardiac arrest/PCI: LAD 50p/m, LCX 100d (2.5 x 30 Resolute Onyx DES); b. 04/2021 PCI: 04/2021 LM nl, LAD 50p/815m2.5x30 Onyx Frontier DES), LCX 25p, 502mFR 0.98), 40d ISR (RFR 0.98), OM2 25, RCA mild diff dzs.   Diabetes mellitus, type 2 ([place]/25/2022   10/06/20 A1C 7%   Dizziness 10/11/2019   Encounter for imaging study to confirm orogastric (OG) tube placement    Hematuria 10/26/2020   HFrEF (heart failure with reduced ejection fraction) ([place]  a. 11/2020 Echo: EF 25-30%; b. 02/2021 Echo: EF 25-30%, glob HK. Mild LVH. Nl RV fxn. Triv AI. Ao root 78m20m History of pulmonary embolus (PE)    HLD (hyperlipidemia) 10/26/2020   Hypertension     Iron deficiency anemia rec'd IV iron 10/26/2020   Ischemic cardiomyopathy    a. 11/2020 Echo: EF 25-30%; b. 02/2021 Echo: EF 25-30%, glob HK.   Left bundle branch block 12/19/2017   Meningioma (HCC)[place]/11/2019   Nonsustained ventricular tachycardia (HCC)[place]/29/2021   Pain of left hip joint 05/29/2020   Personal history of pulmonary embolism 12/19/2017   Pneumonia of both lungs due to infectious organism    Pulmonary hypertension due to thromboembolism (HCC)[place]/28/2019   See echo 12/27/17 with nl PAS vs CTa chest 02/23/18    S/P angioplasty with stent 10/04/20 DES to LCX  10/26/2020   Sinus bradycardia 12/22/2017   SOB (shortness of breath)    Solitary pulmonary nodule on lung CT 03/08/2018   CT 12/04/17 1.0 x 0.8 x 0.7 cm nodular opacity in the RUL vs not seen 03/16/10 posterior segment of the right upper lobe. .[facility]pi[place] Kitchenometry 03/08/2018    FEV1 3.86 (108%)  Ratio 96 s prior rx  - PET  03/13/18   Low grade c/w adenoca > rec T surgery eval     STEMI (ST elevation myocardial infarction) (HCC)[place]/23/2022   Urinary retention 10/26/2020   Vestibular schwannoma (HCC)[place]/11/2019   Past Surgical History:  Procedure Laterality Date   APPENDECTOMY     BIV ICD INSERTION CRT-D N/A 11/03/2021   Procedure: BIV ICD INSERTION CRT-D;  Surgeon: CamnConstance Haw;  [facility];  Service: Cardiovascular;  Laterality: N/A;   CARDIAC CATHETERIZATION     CORONARY ANGIOPLASTY WITH STENT PLACEMENT Left 04/12/2021   LAD stent placement   CORONARY STENT INTERVENTION N/A 04/12/2021   Procedure: CORONARY STENT INTERVENTION;  Surgeon: Nelva Bush, MD;  Location: [facility];  Service: Cardiovascular;  Laterality: N/A;   CORONARY/GRAFT ACUTE MI REVASCULARIZATION N/A 10/04/2020   Procedure: Coronary/Graft Acute MI Revascularization;  Surgeon: Nelva Bush, MD;  Location: [facility];  Service: Cardiovascular;  Laterality: N/A;   HERNIA REPAIR     INTRAVASCULAR PRESSURE WIRE/FFR STUDY  N/A 04/12/2021   Procedure: INTRAVASCULAR PRESSURE WIRE/FFR STUDY;  Surgeon: Nelva Bush, MD;  Location: [facility];  Service: Cardiovascular;  Laterality: N/A;   INTRAVASCULAR ULTRASOUND/IVUS N/A 04/12/2021   Procedure: Intravascular Ultrasound/IVUS;  Surgeon: Nelva Bush, MD;  Location: [facility];  Service: Cardiovascular;  Laterality: N/A;   LEFT HEART CATH AND CORONARY ANGIOGRAPHY N/A 10/04/2020   Procedure: LEFT HEART CATH AND CORONARY ANGIOGRAPHY;  Surgeon: Nelva Bush, MD;  Location: [facility];  Service: Cardiovascular;  Laterality: N/A;   RIGHT/LEFT HEART CATH AND CORONARY ANGIOGRAPHY N/A 04/12/2021   Procedure: RIGHT/LEFT HEART CATH AND CORONARY ANGIOGRAPHY;  Surgeon: Nelva Bush, MD;  Location: [facility];  Service: Cardiovascular;  Laterality: N/A;    Allergies  No Known Allergies  History of Present Illness    76 year old male with the above past medical history including CAD s/p DES-LAD in 04/2021, ICM s/p ICD, chronic systolic heart failure, LBBB, ascending aortic aneurysm, hypertension, hyperlipidemia, NSVT, meningioma s/p gamma knife radiation, CKD stage III, type 2 diabetes and anemia.  He was shoveling snow in January 2022 and suddenly collapsed.  He was noted to be in  a ventricular fibrillation and was diagnosed with acute STEMI. Catheterization revealed 50% pLAD, 80%mLAD, 25% pLCx, 100% dLCx s/p DES (culprit vessel), 25% OM2 stenosis, and mild diffuse disease in the RCA.  Echocardiogram at the time showed EF 20 to 25%, severely decreased LV function, LV global hypokinesis, mild concentric LVH, G1 DD, mild mitral valve regurgitation, trivial aortic valve regurgitation. Echocardiogram in March 2022 showed EF 25 to 30%, severely decreased LV function, LV global hypokinesis.  He was referred to EP, however, he declined ICD implantation at the time. Repeat cardiac catheterization in August 2022 revealed 50% pLAD 80% s/p DES, mLAD, 25%  pLCx, 50% m-dLCx, dLCx 40%, 25% Om2 stenosis. Echocardiogram in November 2022 showed EF 30 to 35%, currently decreased LV function, indeterminate LV diastolic parameters, ascending aorta aneurysm, measuring 44 mm.  He was referred to CT surgery ongoing management of abdominal aortic aneurysm. He was last seen in the office on 10/01/2021 from a cardiac standpoint.  He underwent ICD/PPM implantation on 11/03/2021.  He has not been seen in follow-up since.    He presented to the ED on 01/22/2022 sided weakness following a fall at home. MRI of the brain as well as C-spine without acute findings, but showed his known meningiomas with slightly worsening surrounding edema.  Hospitalized from 01/21/2022 to 01/25/2022.  Neurology and neurosurgery were consulted.  He was treated with IV steroids and subsequent oral steroid taper. Cardiology was consulted due to possible atrial fibrillation, however, EKG and telemetry showed sinus rhythm with PACs.  He was evaluated by PT and was discharged to Inpatient medicine and rehab due to physical deconditioning, right hemiparalysis.  Subsequent MRI of the shoulder showed right rotator cuff tear.  ARB was discontinued in the setting of  acute renal failure.  He was discharged to home on 02/02/2022.  He presents today for follow-up and for preoperative cardiac evaluation for upcoming [place] procedure with Dr. Link Snuffer of [facility], date TBD, with request to hold ASA prior to procedure.  Since his hospitalization he has done well from a cardiac standpoint. Denies any symptoms concerning for angina, denies dyspnea. He is back to his baseline activity level and is eager to have his urological procedure. He states he has already stopped taking his aspirin in anticipation of surgery.  Home Medications    Current Outpatient Medications  Medication Sig Dispense Refill   acetaminophen (TYLENOL) 325 MG tablet Take 1-2 tablets (325-650 mg total) by mouth every 4 (four) hours as needed  for mild pain.     atorvastatin (LIPITOR) 80 MG tablet Take 1 tablet (80 mg total) by mouth daily. 90 tablet 3   B Complex-C (SUPER B COMPLEX PO) Take 1 tablet by mouth daily. Unknown strength     dexamethasone (DECADRON) 2 MG tablet Take 1 tablet (2 mg total) by mouth every 6 (six) hours for 28 days. 112 tablet 0   fluticasone (FLONASE) 50 MCG/ACT nasal spray Place 1-2 sprays into both nostrils at bedtime.  2   isosorbide dinitrate (ISORDIL) 30 MG tablet Take 1 tablet (30 mg total) by mouth 2 (two) times daily. 180 tablet 2   melatonin 5 MG TABS Take 1 tablet (5 mg total) by mouth at bedtime. 30 tablet 0   metoprolol succinate (TOPROL-XL) 50 MG 24 hr tablet TAKE 1 TABLET DAILY, WITH  OR IMMEDIATELY FOLLOWING A MEAL (Patient taking differently: Take 50 mg by mouth daily with breakfast.) 90 tablet 3   Multiple Vitamin (MULTIVITAMIN WITH MINERALS) TABS tablet Take 1 tablet by mouth daily. (Patient taking differently: Take 1 tablet by mouth daily. Unknown strength)     senna-docusate (SENOKOT-S) 8.6-50 MG tablet Take 1 tablet by mouth at bedtime as needed for mild constipation.     No current facility-administered medications for this visit.     Review of Systems    He denies chest pain, palpitations, dyspnea, pnd, orthopnea, n, v, dizziness, syncope, edema, weight gain, or early satiety. All other systems reviewed and are otherwise negative except as noted above.   Physical Exam    VS:  BP 116/60   Pulse 63   Ht '6\' 1"'$  (1.854 m)   Wt 219 lb 9.6 oz (99.6 kg)   SpO2 97%   BMI 28.97 kg/m  GEN: Well nourished, well developed, in no acute distress. HEENT: normal. Neck: Supple, no JVD, carotid bruits, or masses. Cardiac: RRR, no murmurs, rubs, or gallops. No clubbing, cyanosis, edema.  Radials/DP/PT 2+ and equal bilaterally.  Respiratory:  Respirations regular and unlabored, clear to auscultation bilaterally. GI: Soft, nontender, nondistended, BS + x 4. MS: no deformity or atrophy. Skin:  warm and dry, no rash. Neuro:  Strength and sensation are intact. Psych: Normal affect.  Accessory Clinical Findings    ECG personally reviewed by me today -atrial sensed, ventricular paced with occasional AV dual paced complexes and PACs, 63 bpm- no acute changes.  Lab Results  Component Value Date   WBC 14.0 (H) 02/02/2022   HGB 10.2 (L) 02/02/2022   HCT 30.9 (L) 02/02/2022   MCV 93.1 02/02/2022   PLT 208 02/02/2022   Lab Results  Component Value Date   CREATININE 1.84 (H) 02/02/2022   BUN 51 (H) 02/02/2022   NA 139 02/02/2022   K 4.9  02/02/2022   CL 113 (H) 02/02/2022   CO2 17 (L) 02/02/2022   Lab Results  Component Value Date   ALT 22 01/26/2022   AST 18 01/26/2022   ALKPHOS 49 01/26/2022   BILITOT 0.9 01/26/2022   Lab Results  Component Value Date   CHOL 84 04/13/2021   HDL 26 (L) 04/13/2021   LDLCALC 18 04/13/2021   TRIG 201 (H) 04/13/2021   CHOLHDL 3.2 04/13/2021    Lab Results  Component Value Date   HGBA1C 7.0 (H) 10/06/2020    Assessment & Plan    1. CAD: S/p DES-LAD in 04/2021. Stable with no anginal symptoms. No indication for ischemic evaluation.  He is no longer on Brilinta.  Continue aspirin, isosorbide dinitrate, metoprolol, and Lipitor.  2. ICM/chronic systolic heart failure: Echocardiogram in November 2022 showed EF 30 to 35%, currently decreased LV function, indeterminate LV diastolic parameters, ascending aorta aneurysm, measuring 44 mm. S/p ICD/PPM implantation. Euvolemic and well compensated on exam.  He has follow-up scheduled with [name] on 02/28/2022.  Continue current medications as above.  3. Ascending aortic aneurysm: Following with CT surgery.  4. Hypertension: BP well controlled. Continue current antihypertensive regimen.   5. Hyperlipidemia: LDL was 18 in August 2022.  Continue aspirin, Lipitor.  6. Meningioma: S/p gamma knife radiation.  Recent steroid taper.  Following with [facility]  7. CKD stage III: Creatinine was  1.84 on 02/02/2022.  8. Type 2 diabetes: A1c was 7.0 in January 2022.  Monitored and managed per PCP.  9. Preoperative cardiac evaluation: According to the Revised Cardiac Risk Index (RCRI), his Perioperative Risk of Major Cardiac Event is (%): 6.6. His Functional Capacity in METs is: 7.44 according to the [employer] Activity Status Index (DASI). Therefore, based on ACC/AHA guidelines, patient would be at acceptable risk for the planned procedure without further cardiovascular testing.  I will reach out to [name] for recommendations on holding aspirin prior to procedure, though, patient has held aspirin for the past 3 days in anticipation of this procedure.  I will route this recommendation to the requesting party via Epic fax function.  10. Disposition: Follow-up with [name] as scheduled, follow-up in 3-4 months with [name].   Lenna Sciara, NP 02/22/2022, 5:28 PM

## 2022-02-22 NOTE — Progress Notes (Signed)
Remote ICD transmission.   

## 2022-02-24 ENCOUNTER — Encounter: Payer: Self-pay | Admitting: Physical Therapy

## 2022-02-24 ENCOUNTER — Ambulatory Visit: Payer: Medicare Other | Admitting: Physical Therapy

## 2022-02-24 DIAGNOSIS — D329 Benign neoplasm of meninges, unspecified: Secondary | ICD-10-CM | POA: Diagnosis not present

## 2022-02-24 DIAGNOSIS — M25511 Pain in right shoulder: Secondary | ICD-10-CM | POA: Diagnosis not present

## 2022-02-24 DIAGNOSIS — R2681 Unsteadiness on feet: Secondary | ICD-10-CM

## 2022-02-24 DIAGNOSIS — Y92009 Unspecified place in unspecified non-institutional (private) residence as the place of occurrence of the external cause: Secondary | ICD-10-CM

## 2022-02-24 DIAGNOSIS — M6281 Muscle weakness (generalized): Secondary | ICD-10-CM | POA: Diagnosis not present

## 2022-02-24 DIAGNOSIS — R278 Other lack of coordination: Secondary | ICD-10-CM | POA: Diagnosis not present

## 2022-02-24 DIAGNOSIS — R2689 Other abnormalities of gait and mobility: Secondary | ICD-10-CM

## 2022-02-24 DIAGNOSIS — R531 Weakness: Secondary | ICD-10-CM

## 2022-02-24 DIAGNOSIS — R29898 Other symptoms and signs involving the musculoskeletal system: Secondary | ICD-10-CM

## 2022-02-24 DIAGNOSIS — W19XXXA Unspecified fall, initial encounter: Secondary | ICD-10-CM

## 2022-02-24 NOTE — Therapy (Signed)
OUTPATIENT PHYSICAL THERAPY NEURO EVALUATION   Patient Name: Timothy Moran MRN: [id number] DOB:[birth year], 76 y.o., male Today's Date: 02/24/2022   PCP: [place], [place] PROVIDER: Barbie Banner, PA-C    PT End of Session - 02/24/22 1059     Visit Number 4    Date for PT Re-Evaluation 04/21/22    PT Start Time 1016    PT Stop Time 1057    PT Time Calculation (min) 41 min    Equipment Utilized During Treatment Gait belt    Activity Tolerance Patient tolerated treatment well    Behavior During Therapy [facility] for tasks assessed/performed               Past Medical History:  Diagnosis Date   Acute blood loss anemia 10/05/2020   Acute respiratory failure ([place]), hypothermia therapy, vent - extubated 10/06/20    AKI (acute kidney injury) ([place]) 10/05/2020   Anoxic brain injury ([place]) 10/26/2020   Arrhythmia    Ascending aortic aneurysm ([place]) 01/29/2018   a. 2019 4.3 cm by echo; b. 02/2021 Echo: Ao root 41m.   Benign brain tumor ([place]    Benign neoplasm of brain ([facility] 12/19/2017   Cardiac arrest with ventricular fibrillation ([place]    Chest pain 12/04/2017   Chronic kidney disease, stage III (moderate) ([facility] 12/19/2017   CKD (chronic kidney disease), stage III - IV (HCC)    Coronary Artery Disease    a. 12/7741Inf STEMI complicated by cardiac arrest/PCI: LAD 50p/m, LCX 100d (2.5 x 30 Resolute Onyx DES); b. 04/2021 PCI: 04/2021 LM nl, LAD 50p/833m2.5x30 Onyx Frontier DES), LCX 25p, 5038mFR 0.98), 40d ISR (RFR 0.98), OM2 25, RCA mild diff dzs.   Diabetes mellitus, type 2 ([facility]CC[place]1/25/2022   10/06/20 A1C 7%   Dizziness 10/11/2019   Encounter for imaging study to confirm orogastric (OG) tube placement    Hematuria 10/26/2020   HFrEF (heart failure with reduced ejection fraction) ([place]  a. 11/2020 Echo: EF 25-30%; b. 02/2021 Echo: EF 25-30%, glob HK. Mild LVH. Nl RV fxn. Triv AI. Ao root 55m82m History of pulmonary embolus (PE)    HLD (hyperlipidemia) 10/26/2020    Hypertension    Iron deficiency anemia rec'd IV iron 10/26/2020   Ischemic cardiomyopathy    a. 11/2020 Echo: EF 25-30%; b. 02/2021 Echo: EF 25-30%, glob HK.   Left bundle branch block 12/19/2017   Meningioma (HCC)[place]/11/2019   Nonsustained ventricular tachycardia (HCC)[place]/29/2021   Pain of left hip joint 05/29/2020   Personal history of pulmonary embolism 12/19/2017   Pneumonia of both lungs due to infectious organism    Pulmonary hypertension due to thromboembolism (HCC)[place]/28/2019   See echo 12/27/17 with nl PAS vs CTa chest 02/23/18    S/P angioplasty with stent 10/04/20 DES to LCX  10/26/2020   Sinus bradycardia 12/22/2017   SOB (shortness of breath)    Solitary pulmonary nodule on lung CT 03/08/2018   CT 12/04/17 1.0 x 0.8 x 0.7 cm nodular opacity in the RUL vs not seen 03/16/10 posterior segment of the right upper lobe. .Spi[place] Kitchenometry 03/08/2018    FEV1 3.86 (108%)  Ratio 96 s prior rx  - PET  03/13/18   Low grade c/w adenoca > rec T surgery eval     STEMI (ST elevation myocardial infarction) (HCC)[place]/23/2022   Urinary retention 10/26/2020   Vestibular schwannoma (HCC)[place]/11/2019   Past Surgical History:  Procedure Laterality Date   APPENDECTOMY  BIV ICD INSERTION CRT-D N/A 11/03/2021   Procedure: BIV ICD INSERTION CRT-D;  Surgeon: Constance Haw, MD;  Location: [place] [facility];  Service: Cardiovascular;  Laterality: N/A;   CARDIAC CATHETERIZATION     CORONARY ANGIOPLASTY WITH STENT PLACEMENT Left 04/12/2021   LAD stent placement   CORONARY STENT INTERVENTION N/A 04/12/2021   Procedure: CORONARY STENT INTERVENTION;  Surgeon: Nelva Bush, MD;  Location: [place] [facility];  Service: Cardiovascular;  Laterality: N/A;   CORONARY/GRAFT ACUTE MI REVASCULARIZATION N/A 10/04/2020   Procedure: Coronary/Graft Acute MI Revascularization;  Surgeon: Nelva Bush, MD;  Location: [place] [facility];  Service: Cardiovascular;  Laterality: N/A;   HERNIA REPAIR     INTRAVASCULAR PRESSURE  WIRE/FFR STUDY N/A 04/12/2021   Procedure: INTRAVASCULAR PRESSURE WIRE/FFR STUDY;  Surgeon: Nelva Bush, MD;  Location: [place] [facility];  Service: Cardiovascular;  Laterality: N/A;   INTRAVASCULAR ULTRASOUND/IVUS N/A 04/12/2021   Procedure: Intravascular Ultrasound/IVUS;  Surgeon: Nelva Bush, MD;  Location: [place] [facility];  Service: Cardiovascular;  Laterality: N/A;   LEFT HEART CATH AND CORONARY ANGIOGRAPHY N/A 10/04/2020   Procedure: LEFT HEART CATH AND CORONARY ANGIOGRAPHY;  Surgeon: Nelva Bush, MD;  Location: [place] [facility];  Service: Cardiovascular;  Laterality: N/A;   RIGHT/LEFT HEART CATH AND CORONARY ANGIOGRAPHY N/A 04/12/2021   Procedure: RIGHT/LEFT HEART CATH AND CORONARY ANGIOGRAPHY;  Surgeon: Nelva Bush, MD;  Location: [place] [facility];  Service: Cardiovascular;  Laterality: N/A;   Patient Active Problem List   Diagnosis Date Noted   Anemia    Leukocytosis    Right arm weakness 01/25/2022   Right sided weakness    Fall at home 01/22/2022   Status post biventricular pacemaker - MRI compatible PPM and leads 01/22/2022   Chronic indwelling Foley catheter 01/22/2022   CKD (chronic kidney disease), stage III - IV ([place]) 06/28/2021   Chronic HFrEF (heart failure with reduced ejection fraction) ([place]) 03/31/2021   Arrhythmia    DM (diabetes mellitus), type 2 ([place]) new 10/26/2020   Urinary retention 10/26/2020   Hyperlipidemia LDL goal <70 10/26/2020   Coronary artery disease of native artery of native heart with stable angina pectoris ([place]) 10/26/2020   S/P angioplasty with stent 10/04/20 DES to LCX  10/26/2020   Ischemic cardiomyopathy 10/26/2020   Diabetes mellitus, type 2 ([place]) 10/06/2020   DOE (dyspnea on exertion)    Meningioma ([place]) 08/14/2020   Vestibular schwannoma ([place]) 08/14/2020   Cardiac arrest with ventricular fibrillation (HCC)    Pain of left hip joint 05/29/2020   Benign brain tumor ([place])    Chronic kidney disease    History of  pulmonary embolus (PE)    Hypertension    Nonsustained ventricular tachycardia ([place]) 10/11/2019   Dizziness 10/11/2019   Abnormal echocardiogram 06/07/2019   Pulmonary hypertension due to thromboembolism ([place]) 03/09/2018   Solitary pulmonary nodule on lung CT 03/08/2018   Ascending aortic aneurysm (HCC) 4.2 cm based on CT from December 2020 01/29/2018   Sinus bradycardia 12/22/2017   Left bundle branch block 12/19/2017   Personal history of pulmonary embolism - no longer on anticoagulation due to pt refusal to take it anymore. 12/19/2017   Chronic kidney disease, stage III (moderate) ([place]) 12/19/2017   Benign neoplasm of brain ([place]) 12/19/2017    ONSET DATE: 02/01/2022   REFERRING DIAG: Diagnosis R53.1 (ICD-10-CM) - Right sided weakness D32.9 (ICD-10-CM) - Meningioma (HCC)   THERAPY DIAG:  Unsteadiness on feet  Other lack of coordination  Muscle weakness (  generalized)  Right sided weakness  Fall in home, initial encounter  Meningioma ([place])  Poor body mechanics  Other abnormalities of gait and mobility  Rationale for Evaluation and Treatment Rehabilitation  SUBJECTIVE:                                                                                                                                                                                              SUBJECTIVE STATEMENT: Patient reports no real changes. He is scheduled to get his catheter out next month. He forgot to perform the supine exercises in HEP, will attempt this weekend and report on how challenging they are.  PERTINENT HISTORY: Brief HPI:   Raheim Beutler is a 76 y.o. male who fell backward striking his head and right neck shoulder area on 01/21/2022. He complained of right hemiparesis. CT head revealed left parafalcine meningioma.Started on Decadron with improvement in symptoms   PAIN:  Are you having pain? No  PRECAUTIONS: None  WEIGHT BEARING RESTRICTIONS No  FALLS: Has patient fallen in last 6  months? Yes. Number of falls 1  LIVING ENVIRONMENT: Lives with: lives with their spouse Lives in: House/apartment Stairs: Yes: External: 7 steps; bilateral but cannot reach both Has following equipment at home: Walker - 2 wheeled  PLOF: [place] Patient would like to return to normal. Able to participate in all activities, he was not limited before.  OBJECTIVE:  MUSCLE LENGTH: Hamstrings: Right 72 deg; Left 78 deg Eval   POSTURE: rounded shoulders  LOWER EXTREMITY ROM:   PROM WFL in LE   LOWER EXTREMITY MMT:    MMT Right Eval Left Eval  Hip flexion 4 4  Hip extension 4- 4  Hip abduction 4- 4  Hip adduction    Hip internal rotation    Hip external rotation    Knee flexion 4 4  Knee extension 4 4  Ankle dorsiflexion 4 4  Ankle plantarflexion    Ankle inversion    Ankle eversion    (Blank rows = not tested)   FUNCTIONAL TESTs:  EVAL 5 times sit to stand: 16.97 Timed up and go (TUG): 14.79 Functional gait assessment: 13/30  TODAY'S TREATMENT:     02/24/22 Nu-Step L5 x 6 minutes B side step onto and off of Airex Pad, occasional min A for balance. 10 reps each way, improved with repetition. Standing in tandem in parallel bars. Once stable, he performed forward and back weight shifts and then rotation. He was very unsteady initially, improved with active stabilization through his hips. Attempted standing on upside down BOSU, patient was too unstable. Moved to [employer],  ant/post and then laterally. Very shaky initially, with poor ankle control, but he did improve with repetition.  02/17/22   Recumbent bike 4.0 x 6 minutes.   Bridge with hip abd against Red Tband 2 x 10   SLR, SLR with ER, 1 x 10 reps with 2# resistance.   Supine march x 10   Standing sh ext, rows, ER 10 reps each G Tband Standing 3 way kick with UE support, G Tband 10 reps    Tx. 02/15/2022   NuStep L5 x6 min  Sit to stands UE assist elevated mat 2x10  4in step HHA x1  2x10 Rows green 2x15 Shoulder Extensions green 2x15 Leg curls 35lb 2x10   Leg Ext 10lb 2x10   Bridges x10 x5    SLR 2x10 each    Hooklying ball squeeze x15   PATIENT EDUCATION: Education details: HEP, rest between exercises Person educated: patient Education method: Explanation, demo, handout Education comprehension: verbalized understanding   HOME EXERCISE PROGRAM: Access Code: [email or web address] URL: [email or web address] Date: 02/17/2022 Prepared by: Ethel Rana  Exercises - Supine Bridge with Resistance Band  - 1 x daily - 7 x weekly - 2 sets - 10 reps - Supine Straight Leg Raises  - 1 x daily - 7 x weekly - 2 sets - 10 reps - Straight Leg Raise with External Rotation  - 1 x daily - 7 x weekly - 2 sets - 10 reps - Supine March  - 1 x daily - 7 x weekly - 2 sets - 10 reps - Shoulder Extension with Resistance  - 1 x daily - 7 x weekly - 2 sets - 10 reps - Standing Bilateral Low Shoulder Row with Anchored Resistance  - 1 x daily - 7 x weekly - 2 sets - 10 reps - Shoulder External Rotation and Scapular Retraction with Resistance  - 1 x daily - 7 x weekly - 2 sets - 10 reps - Standing 3-Way Leg Reach with Resistance at Ankles and Counter Support  - 1 x daily - 7 x weekly - 2 sets - 10 reps    GOALS: Goals reviewed with patient? Yes  SHORT TERM GOALS: Target date: 03/10/2022  I with initial HEP Baseline: Initiated Goal status: ongoing   2.  FGA improved to 16/30 Baseline: 13 Goal status: INITIAL   LONG TERM GOALS: Target date: 04/21/2022  I with final HEP Baseline:  Goal status: INITIAL  2.  Paitent will complete 5x STS in < 12 seconds to demosntrate improved balance and LE strength. Baseline: 16.9 with unsteadiness each time he rises. Goal status: INITIAL  3.  Patient will ambulate x at least 1000' on level and unlevel surfaces I, no loss of balance or unsteadiness noted. Baseline: Ataxic, unsteady, 100' Goal status: INITIAL  4.  Complete TUG in < 10  sec to demosntrate improved balance Baseline: 14.79 Goal status: INITIAL  5.  Patient will score at least 21/27 Baseline: 13/27-Patient cannot perform heel toe gait. Goal status: INITIAL    ASSESSMENT:  CLINICAL IMPRESSION: Patient reports no issues. Treatment focused on balance. He has great challenge with ankle stability as identified on rocker board, also narrow BOS. Unable to determine if this is influenced by his meningioma, but worth continuing to challenge and see if he responds with improved balance.  OBJECTIVE IMPAIRMENTS Abnormal gait, decreased balance, decreased coordination, decreased endurance, decreased mobility, difficulty walking, decreased ROM, decreased strength, impaired flexibility, improper body mechanics, and postural dysfunction.   ACTIVITY LIMITATIONS carrying, lifting,  squatting, stairs, transfers, reach over head, and locomotion level  PARTICIPATION LIMITATIONS: cleaning, laundry, shopping, and yard work  PERSONAL FACTORS Past/current experiences and 1 comorbidity: L meningioma  are also affecting patient's functional outcome.   REHAB POTENTIAL: Good  CLINICAL DECISION MAKING: Evolving/moderate complexity  EVALUATION COMPLEXITY: Moderate  PLAN: PT FREQUENCY: 2x/week  PT DURATION: 10 weeks  PLANNED INTERVENTIONS: Therapeutic exercises, Therapeutic activity, Neuromuscular re-education, Balance training, Gait training, Patient/Family education, Joint mobilization, Stair training, and Manual therapy  PLAN FOR NEXT SESSION:  strengthening, balance training   Marcelina Morel, DPT 02/24/2022, 11:01 AM

## 2022-02-28 ENCOUNTER — Encounter: Payer: Self-pay | Admitting: Cardiology

## 2022-02-28 ENCOUNTER — Ambulatory Visit (INDEPENDENT_AMBULATORY_CARE_PROVIDER_SITE_OTHER): Payer: Medicare Other | Admitting: Cardiology

## 2022-02-28 VITALS — BP 128/70 | HR 54 | Ht 73.0 in | Wt 223.0 lb

## 2022-02-28 DIAGNOSIS — I255 Ischemic cardiomyopathy: Secondary | ICD-10-CM | POA: Diagnosis not present

## 2022-02-28 DIAGNOSIS — I5022 Chronic systolic (congestive) heart failure: Secondary | ICD-10-CM

## 2022-02-28 NOTE — Progress Notes (Addendum)
Electrophysiology Office Note   Date:  02/28/2022   ID:  Timothy Moran, DOB 01-Jan-1946, MRN [id number]  PCP:  Cari Caraway, MD  Cardiologist:  Agustin Cree Primary Electrophysiologist:  Finneus Kaneshiro Meredith Leeds, MD    Chief Complaint: CHF   History of Present Illness: Timothy Moran is a 76 y.o. male who is being seen today for the evaluation of CHF at the request of Cari Caraway, MD. Presenting today for electrophysiology evaluation.  He has a history significant for a sending aortic aneurysm, CKD stage III, coronary artery disease, type 2 diabetes, chronic Solik heart failure due to ischemic cardiomyopathy, PE, hyperlipidemia, hypertension.  January 1062 the Bystolic showed chest pain collapse.  He went into ventricular fibrillation.  He was found to have an acute STEMI and had a circumflex stent placed.  He wore a LifeVest but took it off.  He represented to the Cath Lab in August 2022 and had stenting of an 80% LAD lesion.  He is now status post Medtronic CRT-D implanted 11/03/2021.  Today, denies symptoms of palpitations, chest pain, shortness of breath, orthopnea, PND, lower extremity edema, claudication, dizziness, presyncope, syncope, bleeding, or neurologic sequela. The patient is tolerating medications without difficulties.  He is currently feeling well.  He has no chest pain or shortness of breath.  He is able to all of his daily activities.  He had a significant fall requiring 12 days of rehab.  He was stepping out of a doorway and stepped in a hole and fell back against a wall.  He is currently recovering from his fall and otherwise has no issues.   Past Medical History:  Diagnosis Date   Acute blood loss anemia 10/05/2020   Acute respiratory failure ([place]), hypothermia therapy, vent - extubated 10/06/20    AKI (acute kidney injury) ([place]) 10/05/2020   Anoxic brain injury ([place]) 10/26/2020   Arrhythmia    Ascending aortic aneurysm ([place]) 01/29/2018   a. 2019 4.3 cm by echo;  b. 02/2021 Echo: Ao root 56m.   Benign brain tumor ([place]    Benign neoplasm of brain ([facility] 12/19/2017   Cardiac arrest with ventricular fibrillation ([place]    Chest pain 12/04/2017   Chronic kidney disease, stage III (moderate) ([facility] 12/19/2017   CKD (chronic kidney disease), stage III - IV (HCC)    Coronary Artery Disease    a. [id number]/[id number] STEMI complicated by cardiac arrest/PCI: LAD 50p/m, LCX 100d (2.5 x 30 Resolute Onyx DES); b. 04/2021 PCI: 04/2021 LM nl, LAD 50p/857m2.5x30 Onyx Frontier DES), LCX 25p, 5088mFR 0.98), 40d ISR (RFR 0.98), OM2 25, RCA mild diff dzs.   Diabetes mellitus, type 2 ([facility]1/25/2022   10/06/20 A1C 7%   Dizziness 10/11/2019   Encounter for imaging study to confirm orogastric (OG) tube placement    Hematuria 10/26/2020   HFrEF (heart failure with reduced ejection fraction) ([facility]  a. 11/2020 Echo: EF 25-30%; b. 02/2021 Echo: EF 25-30%, glob HK. Mild LVH. Nl RV fxn. Triv AI. Ao root 42m66m History of pulmonary embolus (PE)    HLD (hyperlipidemia) 10/26/2020   Hypertension    Iron deficiency anemia rec'd IV iron 10/26/2020   Ischemic cardiomyopathy    a. 11/2020 Echo: EF 25-30%; b. 02/2021 Echo: EF 25-30%, glob HK.   Left bundle branch block 12/19/2017   Meningioma (HCC)[place]/11/2019   Nonsustained ventricular tachycardia (HCC)[place]/29/2021   Pain of left hip joint 05/29/2020   Personal history of pulmonary embolism 12/19/2017   Pneumonia  of both lungs due to infectious organism    Pulmonary hypertension due to thromboembolism ([place]) 03/09/2018   See echo 12/27/17 with nl PAS vs CTa chest 02/23/18    S/P angioplasty with stent 10/04/20 DES to LCX  10/26/2020   Sinus bradycardia 12/22/2017   SOB (shortness of breath)    Solitary pulmonary nodule on lung CT 03/08/2018   CT 12/04/17 1.0 x 0.8 x 0.7 cm nodular opacity in the RUL vs not seen 03/16/10 posterior segment of the right upper lobe. Marland KitchenSpirometry 03/08/2018    FEV1 3.86 (108%)  Ratio 96 s prior rx  - PET  03/13/18   Low  grade c/w adenoca > rec T surgery eval     STEMI (ST elevation myocardial infarction) ([place]) 10/04/2020   Urinary retention 10/26/2020   Vestibular schwannoma ([place]) 08/14/2020   Past Surgical History:  Procedure Laterality Date   APPENDECTOMY     BIV ICD INSERTION CRT-D N/A 11/03/2021   Procedure: BIV ICD INSERTION CRT-D;  Surgeon: Constance Haw, MD;  Location: [facility];  Service: Cardiovascular;  Laterality: N/A;   CARDIAC CATHETERIZATION     CORONARY ANGIOPLASTY WITH STENT PLACEMENT Left 04/12/2021   LAD stent placement   CORONARY STENT INTERVENTION N/A 04/12/2021   Procedure: CORONARY STENT INTERVENTION;  Surgeon: Nelva Bush, MD;  Location: [facility];  Service: Cardiovascular;  Laterality: N/A;   CORONARY/GRAFT ACUTE MI REVASCULARIZATION N/A 10/04/2020   Procedure: Coronary/Graft Acute MI Revascularization;  Surgeon: Nelva Bush, MD;  Location: [facility];  Service: Cardiovascular;  Laterality: N/A;   HERNIA REPAIR     INTRAVASCULAR PRESSURE WIRE/FFR STUDY N/A 04/12/2021   Procedure: INTRAVASCULAR PRESSURE WIRE/FFR STUDY;  Surgeon: Nelva Bush, MD;  Location: [facility];  Service: Cardiovascular;  Laterality: N/A;   INTRAVASCULAR ULTRASOUND/IVUS N/A 04/12/2021   Procedure: Intravascular Ultrasound/IVUS;  Surgeon: Nelva Bush, MD;  Location: [facility];  Service: Cardiovascular;  Laterality: N/A;   LEFT HEART CATH AND CORONARY ANGIOGRAPHY N/A 10/04/2020   Procedure: LEFT HEART CATH AND CORONARY ANGIOGRAPHY;  Surgeon: Nelva Bush, MD;  Location: [facility];  Service: Cardiovascular;  Laterality: N/A;   RIGHT/LEFT HEART CATH AND CORONARY ANGIOGRAPHY N/A 04/12/2021   Procedure: RIGHT/LEFT HEART CATH AND CORONARY ANGIOGRAPHY;  Surgeon: Nelva Bush, MD;  Location: [facility];  Service: Cardiovascular;  Laterality: N/A;     Current Outpatient Medications  Medication Sig Dispense Refill   acetaminophen  (TYLENOL) 325 MG tablet Take 1-2 tablets (325-650 mg total) by mouth every 4 (four) hours as needed for mild pain.     atorvastatin (LIPITOR) 80 MG tablet Take 1 tablet (80 mg total) by mouth daily. 90 tablet 3   B Complex-C (SUPER B COMPLEX PO) Take 1 tablet by mouth daily. Unknown strength     dexamethasone (DECADRON) 2 MG tablet Take 1 tablet (2 mg total) by mouth every 6 (six) hours for 28 days. 112 tablet 0   fluticasone (FLONASE) 50 MCG/ACT nasal spray Place 1-2 sprays into both nostrils at bedtime.  2   hydrALAZINE (APRESOLINE) 10 MG tablet Take 10 mg by mouth 3 (three) times daily.     isosorbide dinitrate (ISORDIL) 30 MG tablet Take 1 tablet (30 mg total) by mouth 2 (two) times daily. 180 tablet 2   melatonin 5 MG TABS Take 1 tablet (5 mg total) by mouth at bedtime. 30 tablet 0   metoprolol succinate (TOPROL-XL) 50 MG 24 hr tablet TAKE 1  TABLET DAILY, WITH  OR IMMEDIATELY FOLLOWING A MEAL (Patient taking differently: Take 50 mg by mouth daily with breakfast.) 90 tablet 3   Multiple Vitamin (MULTIVITAMIN WITH MINERALS) TABS tablet Take 1 tablet by mouth daily. (Patient taking differently: Take 1 tablet by mouth daily. Unknown strength)     senna-docusate (SENOKOT-S) 8.6-50 MG tablet Take 1 tablet by mouth at bedtime as needed for mild constipation.     No current facility-administered medications for this visit.    Allergies:   Patient has no known allergies.   Social History:  The patient  reports that he quit smoking about 53 years ago. His smoking use included cigarettes. He has a 2.50 pack-year smoking history. He has never used smokeless tobacco. He reports current alcohol use. He reports that he does not use drugs.   Family History:  The patient's family history includes Brain cancer in his sister; Diabetes in his mother; Leukemia in his brother; Melanoma in his brother.   ROS:  Please see the history of present illness.   Otherwise, review of systems is positive for none.   All  other systems are reviewed and negative.   PHYSICAL EXAM: VS:  BP 128/70   Pulse (!) 54   Ht '6\' 1"'$  (1.854 m)   Wt 223 lb (101.2 kg)   SpO2 98%   BMI 29.42 kg/m  , BMI Body mass index is 29.42 kg/m. GEN: Well nourished, well developed, in no acute distress  HEENT: normal  Neck: no JVD, carotid bruits, or masses Cardiac: RRR; no murmurs, rubs, or gallops,no edema  Respiratory:  clear to auscultation bilaterally, normal work of breathing GI: soft, nontender, nondistended, + BS MS: no deformity or atrophy  Skin: warm and dry, device site well healed Neuro:  Strength and sensation are intact Psych: euthymic mood, full affect  EKG:  EKG is not ordered today. Personal review of the ekg ordered 02/22/22 shows this rhythm, PACs, ventricular paced  Personal review of the device interrogation today. Results in [facility]: 01/25/2022: Magnesium 2.0 01/26/2022: ALT 22 02/02/2022: BUN 51; Creatinine, Ser 1.84; Hemoglobin 10.2; Platelets 208; Potassium 4.9; Sodium 139    Lipid Panel     Component Value Date/Time   CHOL 84 04/13/2021 0317   CHOL 247 (H) 06/01/2020 0858   TRIG 201 (H) 04/13/2021 0317   HDL 26 (L) 04/13/2021 0317   HDL 33 (L) 06/01/2020 0858   CHOLHDL 3.2 04/13/2021 0317   VLDL 40 04/13/2021 0317   LDLCALC 18 04/13/2021 0317   LDLCALC 151 (H) 06/01/2020 0858     Wt Readings from Last 3 Encounters:  02/28/22 223 lb (101.2 kg)  02/22/22 219 lb 9.6 oz (99.6 kg)  02/02/22 229 lb 4.5 oz (104 kg)      Other studies Reviewed: Additional studies/ records that were reviewed today include: [facility] 04/12/21  Review of the above records today demonstrates:  Severe single-vessel coronary artery disease with multifocal disease of the proximal and mid LAD of up to 80% that is hemodynamically significant by RFR and IVUS criteria. Moderate LCx disease with 50% de novo stenosis proximal to stent placed in 09/2020.  There is also 40% in-stent restenosis in the LCx/OM2 stent.   These lesions are not hemodynamically significant (RFR = 0.98). Mildly elevated left heart, right heart, and pulmonary artery pressures. Normal Fick cardiac output/index. Successful IVUS- and RFR-guided PCI to 80% mid LAD stenosis using Onyx Frontier 2.5 x 30 mm drug-eluting stent (postdilated to 3.2 mm)  with 0% residual stenosis and TIMI-3 flow.   TTE 07/16/21  1. Left ventricular ejection fraction, by estimation, is 30 to 35%. The  left ventricle has moderately decreased function. The left ventricle has  no regional wall motion abnormalities. Left ventricular diastolic  parameters are indeterminate.   2. Right ventricular systolic function is normal. The right ventricular  size is normal.   3. The mitral valve is normal in structure. No evidence of mitral valve  regurgitation. No evidence of mitral stenosis.   4. The aortic valve is normal in structure. Aortic valve regurgitation is  trivial. No aortic stenosis is present.   5. Aneurysm of the ascending aorta, measuring 44 mm.   6. The inferior vena cava is normal in size with greater than 50%  respiratory variability, suggesting right atrial pressure of 3 mmHg.  ASSESSMENT AND PLAN:  1.  Chronic systolic heart failure due to nonischemic cardiomyopathy: Most recent echo with an ejection fraction of 30 to 35%.  Currently on optimal medical therapy.  Is now status post Medtronic CRT-D implanted 11/03/2021.  Device functioning appropriately.  No changes at this time.  2.  Coronary artery disease: Status post circumflex and LAD stents.  No current chest pain.  Continue with current management.  3.  Preoperative evaluation: Patient has plans for urologic surgery to remove the catheter as well as possible prostate surgery.  He has no chest pain or shortness of breath.  He would be at intermediate risk for this intermediate risk procedure.  No further cardiac testing is needed.  Current medicines are reviewed at length with the patient today.    The patient does not have concerns regarding his medicines.  The following changes were made today:  none  Labs/ tests ordered today include:  No orders of the defined types were placed in this encounter.    Disposition:   FU with [place]en Punches 9 months  Signed, Ralpheal Zappone Meredith Leeds, MD  02/28/2022 2:33 PM     [facility] [address] [place] [address] [phone] (office) [phone] (fax)

## 2022-02-28 NOTE — Patient Instructions (Signed)
Medication Instructions:  Your physician recommends that you continue on your current medications as directed. Please refer to the Current Medication list given to you today.  *If you need a refill on your cardiac medications before your next appointment, please call your pharmacy*   Lab Work: None ordered   Testing/Procedures: None ordered   Follow-Up: At [facility], you and your health needs are our priority.  As part of our continuing mission to provide you with exceptional heart care, we have created designated Provider Care Teams.  These Care Teams include your primary Cardiologist (physician) and Advanced Practice Providers (APPs -  Physician Assistants and Nurse Practitioners) who all work together to provide you with the care you need, when you need it.  Remote monitoring is used to monitor your Pacemaker or ICD from home. This monitoring reduces the number of office visits required to check your device to one time per year. It allows [place] to keep an eye on the functioning of your device to ensure it is working properly. You are scheduled for a device check from home on 05/10/22. You may send your transmission at any time that day. If you have a wireless device, the transmission will be sent automatically. After your physician reviews your transmission, you will receive a postcard with your next transmission date.  Your next appointment:   9 month(s)  The format for your next appointment:   In Person  Provider:   Allegra Lai, MD    Thank you for choosing [facility]!!   Trinidad Curet, RN [phone]    Other Instructions   Important Information About Sugar

## 2022-03-01 ENCOUNTER — Ambulatory Visit: Payer: Medicare Other | Admitting: Physical Therapy

## 2022-03-01 ENCOUNTER — Encounter: Payer: Self-pay | Admitting: Occupational Therapy

## 2022-03-01 ENCOUNTER — Ambulatory Visit: Payer: Medicare Other | Admitting: Occupational Therapy

## 2022-03-01 DIAGNOSIS — M25511 Pain in right shoulder: Secondary | ICD-10-CM

## 2022-03-01 DIAGNOSIS — R278 Other lack of coordination: Secondary | ICD-10-CM

## 2022-03-01 DIAGNOSIS — R2681 Unsteadiness on feet: Secondary | ICD-10-CM

## 2022-03-01 DIAGNOSIS — M6281 Muscle weakness (generalized): Secondary | ICD-10-CM

## 2022-03-01 DIAGNOSIS — R2689 Other abnormalities of gait and mobility: Secondary | ICD-10-CM

## 2022-03-01 DIAGNOSIS — D329 Benign neoplasm of meninges, unspecified: Secondary | ICD-10-CM | POA: Diagnosis not present

## 2022-03-01 DIAGNOSIS — R29898 Other symptoms and signs involving the musculoskeletal system: Secondary | ICD-10-CM

## 2022-03-01 DIAGNOSIS — Y92009 Unspecified place in unspecified non-institutional (private) residence as the place of occurrence of the external cause: Secondary | ICD-10-CM

## 2022-03-01 DIAGNOSIS — R531 Weakness: Secondary | ICD-10-CM

## 2022-03-01 NOTE — Therapy (Unsigned)
OUTPATIENT OCCUPATIONAL THERAPY TREATMENT NOTE & DISCHARGE SUMMARY   Patient Name: Timothy Moran MRN: [id number] DOB:[birth year], 76 y.o., male Today's Date: 03/01/2022  PCP: Cari Caraway, MD REFERRING PROVIDER: Barbie Banner, PA-C   END OF SESSION:   OT End of Session - 03/01/22 0943     Visit Number 3    Date for OT Re-Evaluation 05/11/22    Authorization Type Medicare    Authorization Time Period VL: [place]    Progress Note Due on Visit 10    OT Start Time 0935    OT Stop Time 1015    OT Time Calculation (min) 40 min    Activity Tolerance Patient tolerated treatment well    Behavior During Therapy WFL for tasks assessed/performed            OCCUPATIONAL THERAPY DISCHARGE SUMMARY  Visits from Start of Care: 3  Current functional level related to goals / functional outcomes: ***   Remaining deficits: ***   Education / Equipment: ***   Patient agrees to discharge. Patient goals were {OP Goals:25702::"met"}. Patient is being discharged due to {OP Discharge Reasons:25703::"meeting the stated rehab goals."}.   Past Medical History:  Diagnosis Date   Acute blood loss anemia 10/05/2020   Acute respiratory failure ([place]), hypothermia therapy, vent - extubated 10/06/20    AKI (acute kidney injury) ([place]) 10/05/2020   Anoxic brain injury ([place]) 10/26/2020   Arrhythmia    Ascending aortic aneurysm ([place]) 01/29/2018   a. 2019 4.3 cm by echo; b. 02/2021 Echo: Ao root 4mm.   Benign brain tumor ([place])    Benign neoplasm of brain ([place]) 12/19/2017   Cardiac arrest with ventricular fibrillation ([place])    Chest pain 12/04/2017   Chronic kidney disease, stage III (moderate) ([place]) 12/19/2017   CKD (chronic kidney disease), stage III - IV (HCC)    Coronary Artery Disease    a. 0/3491 Inf STEMI complicated by cardiac arrest/PCI: LAD 50p/m, LCX 100d (2.5 x 30 Resolute Onyx DES); b. 04/2021 PCI: 04/2021 LM nl, LAD 50p/18m (2.5x30 Onyx Frontier DES), LCX 25p, 32m (RFR 0.98), 40d ISR  (RFR 0.98), OM2 25, RCA mild diff dzs.   Diabetes mellitus, type 2 ([place]) 10/06/2020   10/06/20 A1C 7%   Dizziness 10/11/2019   Encounter for imaging study to confirm orogastric (OG) tube placement    Hematuria 10/26/2020   HFrEF (heart failure with reduced ejection fraction) ([place])    a. 11/2020 Echo: EF 25-30%; b. 02/2021 Echo: EF 25-30%, glob HK. Mild LVH. Nl RV fxn. Triv AI. Ao root 30mm.   History of pulmonary embolus (PE)    HLD (hyperlipidemia) 10/26/2020   Hypertension    Iron deficiency anemia rec'd IV iron 10/26/2020   Ischemic cardiomyopathy    a. 11/2020 Echo: EF 25-30%; b. 02/2021 Echo: EF 25-30%, glob HK.   Left bundle branch block 12/19/2017   Meningioma ([place]) 08/14/2020   Nonsustained ventricular tachycardia ([place]) 10/11/2019   Pain of left hip joint 05/29/2020   Personal history of pulmonary embolism 12/19/2017   Pneumonia of both lungs due to infectious organism    Pulmonary hypertension due to thromboembolism ([place]) 03/09/2018   See echo 12/27/17 with nl PAS vs CTa chest 02/23/18    S/P angioplasty with stent 10/04/20 DES to LCX  10/26/2020   Sinus bradycardia 12/22/2017   SOB (shortness of breath)    Solitary pulmonary nodule on lung CT 03/08/2018   CT 12/04/17 1.0 x 0.8 x 0.7 cm nodular opacity in the RUL vs  not seen 03/16/10 posterior segment of the right upper lobe. [place]land KitchenSpirometry 03/08/2018    FEV1 3.86 (108%)  Ratio 96 s prior rx  - PET  03/13/18   Low grade c/w adenoca > rec T surgery eval     STEMI (ST elevation myocardial infarction) ([place]) 10/04/2020   Urinary retention 10/26/2020   Vestibular schwannoma ([place]) 08/14/2020   Past Surgical History:  Procedure Laterality Date   APPENDECTOMY     BIV ICD INSERTION CRT-D N/A 11/03/2021   Procedure: BIV ICD INSERTION CRT-D;  Surgeon: Constance Haw, MD;  Location: [facility];  Service: Cardiovascular;  Laterality: N/A;   CARDIAC CATHETERIZATION     CORONARY ANGIOPLASTY WITH STENT PLACEMENT Left 04/12/2021   LAD stent  placement   CORONARY STENT INTERVENTION N/A 04/12/2021   Procedure: CORONARY STENT INTERVENTION;  Surgeon: Nelva Bush, MD;  Location: [facility];  Service: Cardiovascular;  Laterality: N/A;   CORONARY/GRAFT ACUTE MI REVASCULARIZATION N/A 10/04/2020   Procedure: Coronary/Graft Acute MI Revascularization;  Surgeon: Nelva Bush, MD;  Location: [facility];  Service: Cardiovascular;  Laterality: N/A;   HERNIA REPAIR     INTRAVASCULAR PRESSURE WIRE/FFR STUDY N/A 04/12/2021   Procedure: INTRAVASCULAR PRESSURE WIRE/FFR STUDY;  Surgeon: Nelva Bush, MD;  Location: [facility];  Service: Cardiovascular;  Laterality: N/A;   INTRAVASCULAR ULTRASOUND/IVUS N/A 04/12/2021   Procedure: Intravascular Ultrasound/IVUS;  Surgeon: Nelva Bush, MD;  Location: [facility];  Service: Cardiovascular;  Laterality: N/A;   LEFT HEART CATH AND CORONARY ANGIOGRAPHY N/A 10/04/2020   Procedure: LEFT HEART CATH AND CORONARY ANGIOGRAPHY;  Surgeon: Nelva Bush, MD;  Location: [facility];  Service: Cardiovascular;  Laterality: N/A;   RIGHT/LEFT HEART CATH AND CORONARY ANGIOGRAPHY N/A 04/12/2021   Procedure: RIGHT/LEFT HEART CATH AND CORONARY ANGIOGRAPHY;  Surgeon: Nelva Bush, MD;  Location: [facility];  Service: Cardiovascular;  Laterality: N/A;   Patient Active Problem List   Diagnosis Date Noted   Anemia    Leukocytosis    Right arm weakness 01/25/2022   Right sided weakness    Fall at home 01/22/2022   Status post biventricular pacemaker - MRI compatible PPM and leads 01/22/2022   Chronic indwelling Foley catheter 01/22/2022   CKD (chronic kidney disease), stage III - IV ([place]) 06/28/2021   Chronic HFrEF (heart failure with reduced ejection fraction) ([place]) 03/31/2021   Arrhythmia    DM (diabetes mellitus), type 2 ([place]) new 10/26/2020   Urinary retention 10/26/2020   Hyperlipidemia LDL goal <70 10/26/2020   Coronary artery disease of native artery of  native heart with stable angina pectoris ([place]) 10/26/2020   S/P angioplasty with stent 10/04/20 DES to LCX  10/26/2020   Ischemic cardiomyopathy 10/26/2020   Diabetes mellitus, type 2 ([place]) 10/06/2020   DOE (dyspnea on exertion)    Meningioma ([place]) 08/14/2020   Vestibular schwannoma ([place]) 08/14/2020   Cardiac arrest with ventricular fibrillation (HCC)    Pain of left hip joint 05/29/2020   Benign brain tumor ([place])    Chronic kidney disease    History of pulmonary embolus (PE)    Hypertension    Nonsustained ventricular tachycardia ([place]) 10/11/2019   Dizziness 10/11/2019   Abnormal echocardiogram 06/07/2019   Pulmonary hypertension due to thromboembolism ([place]) 03/09/2018   Solitary pulmonary nodule on lung CT 03/08/2018   Ascending aortic aneurysm (HCC) 4.2 cm based on CT from December 2020 01/29/2018   Sinus bradycardia 12/22/2017   Left bundle branch block  12/19/2017   Personal history of pulmonary embolism - no longer on anticoagulation due to pt refusal to take it anymore. 12/19/2017   Chronic kidney disease, stage III (moderate) ([place]) 12/19/2017   Benign neoplasm of brain ([place]) 12/19/2017    ONSET DATE: 02/01/22 (date of OT order)  REFERRING DIAG: R53.1 (ICD-10-CM) - Right sided weakness D32.9 (ICD-10-CM) - Meningioma ([place])   THERAPY DIAG:  Right shoulder pain, unspecified chronicity  Other lack of coordination  Muscle weakness (generalized)  Other symptoms and signs involving the musculoskeletal system  Rationale for Evaluation and Treatment Rehabilitation  SUBJECTIVE:   SUBJECTIVE STATEMENT: Pt reports his R hand felt more numb than the L hand, but has been getting "better and better." Pt also states he was released to return to driving by his MD and has had no problems w/ this. Pt accompanied by: self and significant other  PAIN: Are you having pain? No  PERTINENT HISTORY: Presented to ED s/p fall on 01/21/22 during which he hit the back of his head and R  neck/shoulder area w/ R hemiparesis; CT revealed L parafalcine meningioma w/ associated vasogenic edema; pt w/ known h/o meningioma being followed by [facility]  MRI on 02/01/22 indicated R shoulder RTC full thickness supraspinatus tear; partial tear of infraspinatus and subscapularis; severe AC OA and moderate GH OA  PMH: biventricular ICD placement (11/03/21); CAD w/ coronary stent intervention Aug 8333; STEMI complicated by cardiac arrest w/ ventricular fibrillation and anoxic BI Jan 2022; HFrEF, HTN, HLD, PE, aortic aneurysm, CKD stage III, chronic indwelling foley catheter, DMT2  PRECAUTIONS: Fall  WEIGHT BEARING RESTRICTIONS: No  PATIENT GOALS: Improve strength and balance   OBJECTIVE:   TODAY'S TREATMENT:  Closed-chain shoulder flex to approx 120 degrees w/ 2# dowel w/out increased pain or difficulty; completed 2x15 while seated EOM w/ min verbal cues to prevent compensatory trunk extension Single-arm shoulder ER w/ 2# dumbbell w/out increased pain or difficulty; completed 2x15 while seated EOM w/ initial verbal/tactile cues to decrease compensatory arm abduction and occ verbal cues for alignment after repetition   PATIENT EDUCATION: Education provided related to progress toward goals and unanticipated d/c this session Person educated: Patient Education method: Explanation Education comprehension: verbalized understanding   HOME EXERCISE PROGRAM: MedBridge Code: [id number] - Standing Shoulder Flexion with Resistance  - 3-4 x daily - 2 sets - 15 reps - Standing Shoulder Extension with Resistance  - 3-4 x daily - 2 sets - 15 reps - Shoulder External Rotation with Anchored Resistance  - 2-3 x daily - 2 sets - 15 reps - Standing Shoulder Row with Anchored Resistance  - 2-3 x daily - 2 sets - 15 reps   GOALS: Goals reviewed with patient? Yes  SHORT TERM GOALS: Target date: 03/25/22  STG  Status:  1 Pt will demonstrate independence for initial HEP designed for ROM within  designated precautions Baseline: No HEP at this time Met - 03/01/22  2 Pt will verbalize understanding of compensatory strategies for memory and attention to improve safety w/ IADL tasks Baseline: 89% accuracy w/ double number cancellation task Met - 03/01/22     LONG TERM GOALS: Target date: 05/06/22  LTG  Status:  1 Pt will demonstrate strength of R shoulder flexion to at least 4-/5 for improved functional forward and overhead reach Baseline: Unable to assess at evaluation Met - 03/01/22; Bilaterally 4/5  2 Pt will decrease time to complete Trail Making Test: Part B w/ no errors by d/c to indicate improved  processing and alternating attention Baseline: 1 min, 40.6 sec Met - 03/01/22  3 Pt will improve score of UEFI to at least 75/80 to indicate improved functional use of BUEs Baseline: 68/80 Met - 03/01/22  4 Pt will be able to lift medium-weight object (~10 lbs) w/ BUEs and no reported pain by d/c Baseline: Lifting precautions/restrictions Met - 03/01/22  5 Pt will demonstrate improved RUE GMC and coordination as evidenced by increasing accuracy w/ finger-to-nose test within 20 sec Baseline: 15x w/ 2 errors Met - 03/01/22; 20x w/ 2 errors     ASSESSMENT:  CLINICAL IMPRESSION: *** Started w/ shoulder strengthening w/ pt able to complete all exercises w/out difficulty. Reviewed progress toward all goals as pt reports having no pain, sensation and coordination feel "normal," and he is completing all functional activities at home w/out difficulty. Only concern at this point is balance and generalized weakness.  PERFORMANCE DEFICITS in functional skills including ADLs, IADLs, coordination, dexterity, sensation, ROM, strength, pain, FMC, GMC, mobility, balance, body mechanics, endurance, cardiopulmonary status limiting function, decreased knowledge of precautions, decreased knowledge of use of DME, and UE functional use, cognitive skills including safety awareness, and psychosocial skills  including environmental adaptation.   IMPAIRMENTS are limiting patient from ADLs, IADLs, and leisure.   COMORBIDITIES has co-morbidities such as h/o meningioma, biventricular ICD insertion, CAD w/ stent intervention, and h/o MI and cardiac arrest  that affects occupational performance. Patient will benefit from skilled OT to address above impairments and improve overall function.   PLAN: OT FREQUENCY: 1-2x/week  OT DURATION: 10 weeks  PLANNED INTERVENTIONS: self care/ADL training, therapeutic exercise, therapeutic activity, neuromuscular re-education, manual therapy, passive range of motion, balance training, functional mobility training, aquatic therapy, splinting, moist heat, cryotherapy, patient/family education, visual/perceptual remediation/compensation, energy conservation, and DME and/or AE instructions  RECOMMENDED OTHER SERVICES: Currently receiving PT services at this location  CONSULTED AND AGREED WITH PLAN OF CARE: Patient and family member/caregiver  PLAN FOR NEXT SESSION: D/C   Kathrine Cords, MSOT, OTR/L 03/01/2022, 12:48 PM

## 2022-03-01 NOTE — Therapy (Signed)
OUTPATIENT PHYSICAL THERAPY NEURO EVALUATION   Patient Name: Timothy Moran MRN: [id number] DOB:01-29-1946, 76 y.o., male Today's Date: 03/01/2022   PCP: Cari Caraway REFERRING PROVIDER: Barbie Banner, PA-C    PT End of Session - 03/01/22 1206     Visit Number 5    Date for PT Re-Evaluation 04/21/22    PT Start Time 1497    PT Stop Time 1055    PT Time Calculation (min) 40 min    Equipment Utilized During Treatment Gait belt    Activity Tolerance Patient tolerated treatment well    Behavior During Therapy [facility] for tasks assessed/performed                Past Medical History:  Diagnosis Date   Acute blood loss anemia 10/05/2020   Acute respiratory failure ([place]), hypothermia therapy, vent - extubated 10/06/20    AKI (acute kidney injury) ([place]) 10/05/2020   Anoxic brain injury ([place]) 10/26/2020   Arrhythmia    Ascending aortic aneurysm ([place]) 01/29/2018   a. 2019 4.3 cm by echo; b. 02/2021 Echo: Ao root 51m.   Benign brain tumor ([place]    Benign neoplasm of brain ([place] 12/19/2017   Cardiac arrest with ventricular fibrillation ([place]    Chest pain 12/04/2017   Chronic kidney disease, stage III (moderate) ([place] 12/19/2017   CKD (chronic kidney disease), stage III - IV (HCC)    Coronary Artery Disease    a. 10/2637Inf STEMI complicated by cardiac arrest/PCI: LAD 50p/m, LCX 100d (2.5 x 30 Resolute Onyx DES); b. 04/2021 PCI: 04/2021 LM nl, LAD 50p/858m2.5x30 Onyx Frontier DES), LCX 25p, 5036mFR 0.98), 40d ISR (RFR 0.98), OM2 25, RCA mild diff dzs.   Diabetes mellitus, type 2 ([place]/25/2022   10/06/20 A1C 7%   Dizziness 10/11/2019   Encounter for imaging study to confirm orogastric (OG) tube placement    Hematuria 10/26/2020   HFrEF (heart failure with reduced ejection fraction) ([place]  a. 11/2020 Echo: EF 25-30%; b. 02/2021 Echo: EF 25-30%, glob HK. Mild LVH. Nl RV fxn. Triv AI. Ao root 71m33m History of pulmonary embolus (PE)    HLD (hyperlipidemia) 10/26/2020    Hypertension    Iron deficiency anemia rec'd IV iron 10/26/2020   Ischemic cardiomyopathy    a. 11/2020 Echo: EF 25-30%; b. 02/2021 Echo: EF 25-30%, glob HK.   Left bundle branch block 12/19/2017   Meningioma (HCC)[place]/11/2019   Nonsustained ventricular tachycardia (HCC)[place]/29/2021   Pain of left hip joint 05/29/2020   Personal history of pulmonary embolism 12/19/2017   Pneumonia of both lungs due to infectious organism    Pulmonary hypertension due to thromboembolism (HCC)[place]/28/2019   See echo 12/27/17 with nl PAS vs CTa chest 02/23/18    S/P angioplasty with stent 10/04/20 DES to LCX  10/26/2020   Sinus bradycardia 12/22/2017   SOB (shortness of breath)    Solitary pulmonary nodule on lung CT 03/08/2018   CT 12/04/17 1.0 x 0.8 x 0.7 cm nodular opacity in the RUL vs not seen 03/16/10 posterior segment of the right upper lobe. .[place]pi[place] Kitchenometry 03/08/2018    FEV1 3.86 (108%)  Ratio 96 s prior rx  - PET  03/13/18   Low grade c/w adenoca > rec T surgery eval     STEMI (ST elevation myocardial infarction) (HCC)[place]/23/2022   Urinary retention 10/26/2020   Vestibular schwannoma (HCC)[place]/11/2019   Past Surgical History:  Procedure Laterality Date   APPENDECTOMY  BIV ICD INSERTION CRT-D N/A 11/03/2021   Procedure: BIV ICD INSERTION CRT-D;  Surgeon: Constance Haw, MD;  Location: [place] [facility];  Service: Cardiovascular;  Laterality: N/A;   CARDIAC CATHETERIZATION     CORONARY ANGIOPLASTY WITH STENT PLACEMENT Left 04/12/2021   LAD stent placement   CORONARY STENT INTERVENTION N/A 04/12/2021   Procedure: CORONARY STENT INTERVENTION;  Surgeon: Nelva Bush, MD;  Location: [place] [facility];  Service: Cardiovascular;  Laterality: N/A;   CORONARY/GRAFT ACUTE MI REVASCULARIZATION N/A 10/04/2020   Procedure: Coronary/Graft Acute MI Revascularization;  Surgeon: Nelva Bush, MD;  Location: [place] [facility];  Service: Cardiovascular;  Laterality: N/A;   HERNIA REPAIR     INTRAVASCULAR PRESSURE  WIRE/FFR STUDY N/A 04/12/2021   Procedure: INTRAVASCULAR PRESSURE WIRE/FFR STUDY;  Surgeon: Nelva Bush, MD;  Location: [place] [facility];  Service: Cardiovascular;  Laterality: N/A;   INTRAVASCULAR ULTRASOUND/IVUS N/A 04/12/2021   Procedure: Intravascular Ultrasound/IVUS;  Surgeon: Nelva Bush, MD;  Location: [place] [facility];  Service: Cardiovascular;  Laterality: N/A;   LEFT HEART CATH AND CORONARY ANGIOGRAPHY N/A 10/04/2020   Procedure: LEFT HEART CATH AND CORONARY ANGIOGRAPHY;  Surgeon: Nelva Bush, MD;  Location: [place] [facility];  Service: Cardiovascular;  Laterality: N/A;   RIGHT/LEFT HEART CATH AND CORONARY ANGIOGRAPHY N/A 04/12/2021   Procedure: RIGHT/LEFT HEART CATH AND CORONARY ANGIOGRAPHY;  Surgeon: Nelva Bush, MD;  Location: [place] [facility];  Service: Cardiovascular;  Laterality: N/A;   Patient Active Problem List   Diagnosis Date Noted   Anemia    Leukocytosis    Right arm weakness 01/25/2022   Right sided weakness    Fall at home 01/22/2022   Status post biventricular pacemaker - MRI compatible PPM and leads 01/22/2022   Chronic indwelling Foley catheter 01/22/2022   CKD (chronic kidney disease), stage III - IV ([place]) 06/28/2021   Chronic HFrEF (heart failure with reduced ejection fraction) ([place]) 03/31/2021   Arrhythmia    DM (diabetes mellitus), type 2 ([place]) new 10/26/2020   Urinary retention 10/26/2020   Hyperlipidemia LDL goal <70 10/26/2020   Coronary artery disease of native artery of native heart with stable angina pectoris ([place]) 10/26/2020   S/P angioplasty with stent 10/04/20 DES to LCX  10/26/2020   Ischemic cardiomyopathy 10/26/2020   Diabetes mellitus, type 2 ([place]) 10/06/2020   DOE (dyspnea on exertion)    Meningioma ([place]) 08/14/2020   Vestibular schwannoma ([place]) 08/14/2020   Cardiac arrest with ventricular fibrillation (HCC)    Pain of left hip joint 05/29/2020   Benign brain tumor ([place])    Chronic kidney disease    History of  pulmonary embolus (PE)    Hypertension    Nonsustained ventricular tachycardia ([place]) 10/11/2019   Dizziness 10/11/2019   Abnormal echocardiogram 06/07/2019   Pulmonary hypertension due to thromboembolism ([place]) 03/09/2018   Solitary pulmonary nodule on lung CT 03/08/2018   Ascending aortic aneurysm (HCC) 4.2 cm based on CT from December 2020 01/29/2018   Sinus bradycardia 12/22/2017   Left bundle branch block 12/19/2017   Personal history of pulmonary embolism - no longer on anticoagulation due to pt refusal to take it anymore. 12/19/2017   Chronic kidney disease, stage III (moderate) ([place]) 12/19/2017   Benign neoplasm of brain ([place]) 12/19/2017    ONSET DATE: 02/01/2022   REFERRING DIAG: Diagnosis R53.1 (ICD-10-CM) - Right sided weakness D32.9 (ICD-10-CM) - Meningioma (HCC)   THERAPY DIAG:  Unsteadiness on feet  Other lack of coordination  Muscle weakness (  generalized)  Right sided weakness  Other abnormalities of gait and mobility  Fall in home, initial encounter  Right shoulder pain, unspecified chronicity  Right arm weakness  Rationale for Evaluation and Treatment Rehabilitation  SUBJECTIVE:                                                                                                                                                                                              SUBJECTIVE STATEMENT: Patient reports no real changes.   PERTINENT HISTORY: Brief HPI:   Kain Milosevic is a 76 y.o. male who fell backward striking his head and right neck shoulder area on 01/21/2022. He complained of right hemiparesis. CT head revealed left parafalcine meningioma.Started on Decadron with improvement in symptoms   PAIN:  Are you having pain? No  PRECAUTIONS: None  WEIGHT BEARING RESTRICTIONS No  FALLS: Has patient fallen in last 6 months? Yes. Number of falls 1  LIVING ENVIRONMENT: Lives with: lives with their spouse Lives in: House/apartment Stairs: Yes:  External: 7 steps; bilateral but cannot reach both Has following equipment at home: Walker - 2 wheeled  PLOF: [place] Patient would like to return to normal. Able to participate in all activities, he was not limited before.  OBJECTIVE:  MUSCLE LENGTH: Hamstrings: Right 72 deg; Left 78 deg Eval   POSTURE: rounded shoulders  LOWER EXTREMITY ROM:   PROM WFL in LE   LOWER EXTREMITY MMT:    MMT Right Eval Left Eval  Hip flexion 4 4  Hip extension 4- 4  Hip abduction 4- 4  Hip adduction    Hip internal rotation    Hip external rotation    Knee flexion 4 4  Knee extension 4 4  Ankle dorsiflexion 4 4  Ankle plantarflexion    Ankle inversion    Ankle eversion    (Blank rows = not tested)   FUNCTIONAL TESTs:  EVAL 5 times sit to stand: 16.97 Timed up and go (TUG): 14.79 Functional gait assessment: 13/30  TODAY'S TREATMENT:    03/01/22 Nu-Step L5 x 6 minutes Lateral push on treadmill, 2 x 10 in each direction, BUE support Alternating step ups onto 6" step with B rails. Mod balance difficulty, required minA x 4 due to LOB and he recovered by himself at other times. 10 reps each leg. SLS with opposite foot on 6" step. Mini squats and rotation around stance leg without UE support, 5 of each, performed with each leg and attempted no UE support, required occasional grab of handrail and occasional min A. Ambulated while holsing 4# weight in BUE, with much more stable  gait, less balance loss, 150', with multiple turns, CGA. Returned to steps and alternate taps, this time holding 4# weight in BUE. He completed 10 alternate taps and then 10 x 3 taps with each foot on 6" step without UE support, much more stable.     02/24/22 Nu-Step L5 x 6 minutes B side step onto and off of Airex Pad, occasional min A for balance. 10 reps each way, improved with repetition. Standing in tandem in parallel bars. Once stable, he performed forward and back weight shifts and then  rotation. He was very unsteady initially, improved with active stabilization through his hips. Attempted standing on upside down BOSU, patient was too unstable. Moved to rocker board, ant/post and then laterally. Very shaky initially, with poor ankle control, but he did improve with repetition.  02/17/22   Recumbent bike 4.0 x 6 minutes.   Bridge with hip abd against Red Tband 2 x 10   SLR, SLR with ER, 1 x 10 reps with 2# resistance.   Supine march x 10   Standing sh ext, rows, ER 10 reps each G Tband Standing 3 way kick with UE support, G Tband 10 reps    Tx. 02/15/2022   NuStep L5 x6 min  Sit to stands UE assist elevated mat 2x10  4in step HHA x1 2x10 Rows green 2x15 Shoulder Extensions green 2x15 Leg curls 35lb 2x10   Leg Ext 10lb 2x10   Bridges x10 x5    SLR 2x10 each    Hooklying ball squeeze x15   PATIENT EDUCATION: Education details: HEP, rest between exercises Person educated: patient Education method: Explanation, demo, handout Education comprehension: verbalized understanding   HOME EXERCISE PROGRAM: Access Code: [email or web address] URL: [email or web address] Date: 02/17/2022     GOALS: Goals reviewed with patient? Yes  SHORT TERM GOALS: Target date: 03/10/2022  I with initial HEP Baseline: Initiated Goal status: ongoing   2.  FGA improved to 16/30 Baseline: 13 Goal status: INITIAL   LONG TERM GOALS: Target date: 04/21/2022  I with final HEP Baseline:  Goal status: INITIAL  2.  Paitent will complete 5x STS in < 12 seconds to demosntrate improved balance and LE strength. Baseline: 16.9 with unsteadiness each time he rises. Goal status: INITIAL  3.  Patient will ambulate x at least 1000' on level and unlevel surfaces I, no loss of balance or unsteadiness noted. Baseline: Ataxic, unsteady, 100' Goal status: INITIAL  4.  Complete TUG in < 10 sec to demosntrate improved balance Baseline: 14.79 Goal status: INITIAL  5.  Patient will score at  least 21/27 Baseline: 13/27-Patient cannot perform heel toe gait. Goal status: INITIAL    ASSESSMENT:  CLINICAL IMPRESSION: Patient reports no issues. Patient with increased ataxia today, with increased instability in standing and single limb stance activities. He responded well to holding 4# dumbell in BUE while up, able to perform more challenging activities with decreased ataxia noted.  OBJECTIVE IMPAIRMENTS Abnormal gait, decreased balance, decreased coordination, decreased endurance, decreased mobility, difficulty walking, decreased ROM, decreased strength, impaired flexibility, improper body mechanics, and postural dysfunction.   ACTIVITY LIMITATIONS carrying, lifting, squatting, stairs, transfers, reach over head, and locomotion level  PARTICIPATION LIMITATIONS: cleaning, laundry, shopping, and yard work  [place] Past/current experiences and 1 comorbidity: L meningioma  are also affecting patient's functional outcome.   REHAB POTENTIAL: Good  CLINICAL DECISION MAKING: Evolving/moderate complexity  EVALUATION COMPLEXITY: Moderate  PLAN: PT FREQUENCY: 2x/week  PT DURATION: 10 weeks  PLANNED INTERVENTIONS: Therapeutic exercises, Therapeutic activity,  Neuromuscular re-education, Balance training, Gait training, Patient/Family education, Joint mobilization, Stair training, and Manual therapy  PLAN FOR NEXT SESSION:  strengthening, balance training   Marcelina Morel, DPT 03/01/2022, 12:10 PM

## 2022-03-03 ENCOUNTER — Encounter: Payer: Self-pay | Admitting: Physical Therapy

## 2022-03-03 ENCOUNTER — Ambulatory Visit: Payer: Medicare Other | Admitting: Physical Therapy

## 2022-03-03 DIAGNOSIS — R2689 Other abnormalities of gait and mobility: Secondary | ICD-10-CM | POA: Diagnosis not present

## 2022-03-03 DIAGNOSIS — R2681 Unsteadiness on feet: Secondary | ICD-10-CM

## 2022-03-03 DIAGNOSIS — M25511 Pain in right shoulder: Secondary | ICD-10-CM | POA: Diagnosis not present

## 2022-03-03 DIAGNOSIS — D329 Benign neoplasm of meninges, unspecified: Secondary | ICD-10-CM | POA: Diagnosis not present

## 2022-03-03 DIAGNOSIS — R531 Weakness: Secondary | ICD-10-CM

## 2022-03-03 DIAGNOSIS — R29898 Other symptoms and signs involving the musculoskeletal system: Secondary | ICD-10-CM

## 2022-03-03 DIAGNOSIS — Y92009 Unspecified place in unspecified non-institutional (private) residence as the place of occurrence of the external cause: Secondary | ICD-10-CM

## 2022-03-03 DIAGNOSIS — R278 Other lack of coordination: Secondary | ICD-10-CM

## 2022-03-03 DIAGNOSIS — M6281 Muscle weakness (generalized): Secondary | ICD-10-CM | POA: Diagnosis not present

## 2022-03-03 NOTE — Therapy (Signed)
OUTPATIENT PHYSICAL THERAPY NEURO EVALUATION   Patient Name: Timothy Moran MRN: [id number] DOB:March 26, 1946, 76 y.o., male Today's Date: 03/03/2022   PCP: Cari Caraway REFERRING PROVIDER: Barbie Banner, PA-C    PT End of Session - 03/03/22 1021     Visit Number 6    Date for PT Re-Evaluation 04/21/22    PT Start Time 1017    PT Stop Time 1056    PT Time Calculation (min) 39 min    Equipment Utilized During Treatment Gait belt    Activity Tolerance Patient tolerated treatment well    Behavior During Therapy [facility] for tasks assessed/performed                 Past Medical History:  Diagnosis Date   Acute blood loss anemia 10/05/2020   Acute respiratory failure ([place]), hypothermia therapy, vent - extubated 10/06/20    AKI (acute kidney injury) ([place]) 10/05/2020   Anoxic brain injury ([place]) 10/26/2020   Arrhythmia    Ascending aortic aneurysm ([place]) 01/29/2018   a. 2019 4.3 cm by echo; b. 02/2021 Echo: Ao root 19m.   Benign brain tumor ([place]    Benign neoplasm of brain ([place] 12/19/2017   Cardiac arrest with ventricular fibrillation ([place]    Chest pain 12/04/2017   Chronic kidney disease, stage III (moderate) ([place] 12/19/2017   CKD (chronic kidney disease), stage III - IV (HCC)    Coronary Artery Disease    a. [id number]/5993Inf STEMI complicated by cardiac arrest/PCI: LAD 50p/m, LCX 100d (2.5 x 30 Resolute Onyx DES); b. 04/2021 PCI: 04/2021 LM nl, LAD 50p/816m2.5x30 Onyx Frontier DES), LCX 25p, 506mFR 0.98), 40d ISR (RFR 0.98), OM2 25, RCA mild diff dzs.   Diabetes mellitus, type 2 ([place]1/25/2022   10/06/20 A1C 7%   Dizziness 10/11/2019   Encounter for imaging study to confirm orogastric (OG) tube placement    Hematuria 10/26/2020   HFrEF (heart failure with reduced ejection fraction) ([place]  a. 11/2020 Echo: EF 25-30%; b. 02/2021 Echo: EF 25-30%, glob HK. Mild LVH. Nl RV fxn. Triv AI. Ao root 32m75m History of pulmonary embolus (PE)    HLD (hyperlipidemia) 10/26/2020    Hypertension    Iron deficiency anemia rec'd IV iron 10/26/2020   Ischemic cardiomyopathy    a. 11/2020 Echo: EF 25-30%; b. 02/2021 Echo: EF 25-30%, glob HK.   Left bundle branch block 12/19/2017   Meningioma (HCC)[place]/11/2019   Nonsustained ventricular tachycardia (HCC)[place]/29/2021   Pain of left hip joint 05/29/2020   Personal history of pulmonary embolism 12/19/2017   Pneumonia of both lungs due to infectious organism    Pulmonary hypertension due to thromboembolism (HCC)[place]/28/2019   See echo 12/27/17 with nl PAS vs CTa chest 02/23/18    S/P angioplasty with stent 10/04/20 DES to LCX  10/26/2020   Sinus bradycardia 12/22/2017   SOB (shortness of breath)    Solitary pulmonary nodule on lung CT 03/08/2018   CT 12/04/17 1.0 x 0.8 x 0.7 cm nodular opacity in the RUL vs not seen 03/16/10 posterior segment of the right upper lobe. .Spi[place] Kitchenometry 03/08/2018    FEV1 3.86 (108%)  Ratio 96 s prior rx  - PET  03/13/18   Low grade c/w adenoca > rec T surgery eval     STEMI (ST elevation myocardial infarction) (HCC)[place]/23/2022   Urinary retention 10/26/2020   Vestibular schwannoma (HCC)[place]/11/2019   Past Surgical History:  Procedure Laterality Date   APPENDECTOMY  BIV ICD INSERTION CRT-D N/A 11/03/2021   Procedure: BIV ICD INSERTION CRT-D;  Surgeon: Constance Haw, MD;  Location: [place] [facility];  Service: Cardiovascular;  Laterality: N/A;   CARDIAC CATHETERIZATION     CORONARY ANGIOPLASTY WITH STENT PLACEMENT Left 04/12/2021   LAD stent placement   CORONARY STENT INTERVENTION N/A 04/12/2021   Procedure: CORONARY STENT INTERVENTION;  Surgeon: Nelva Bush, MD;  Location: [place] [facility];  Service: Cardiovascular;  Laterality: N/A;   CORONARY/GRAFT ACUTE MI REVASCULARIZATION N/A 10/04/2020   Procedure: Coronary/Graft Acute MI Revascularization;  Surgeon: Nelva Bush, MD;  Location: [place] [facility];  Service: Cardiovascular;  Laterality: N/A;   HERNIA REPAIR     INTRAVASCULAR PRESSURE  WIRE/FFR STUDY N/A 04/12/2021   Procedure: INTRAVASCULAR PRESSURE WIRE/FFR STUDY;  Surgeon: Nelva Bush, MD;  Location: [place] [facility];  Service: Cardiovascular;  Laterality: N/A;   INTRAVASCULAR ULTRASOUND/IVUS N/A 04/12/2021   Procedure: Intravascular Ultrasound/IVUS;  Surgeon: Nelva Bush, MD;  Location: [place] [facility];  Service: Cardiovascular;  Laterality: N/A;   LEFT HEART CATH AND CORONARY ANGIOGRAPHY N/A 10/04/2020   Procedure: LEFT HEART CATH AND CORONARY ANGIOGRAPHY;  Surgeon: Nelva Bush, MD;  Location: [place] [facility];  Service: Cardiovascular;  Laterality: N/A;   RIGHT/LEFT HEART CATH AND CORONARY ANGIOGRAPHY N/A 04/12/2021   Procedure: RIGHT/LEFT HEART CATH AND CORONARY ANGIOGRAPHY;  Surgeon: Nelva Bush, MD;  Location: [place] [facility];  Service: Cardiovascular;  Laterality: N/A;   Patient Active Problem List   Diagnosis Date Noted   Anemia    Leukocytosis    Right arm weakness 01/25/2022   Right sided weakness    Fall at home 01/22/2022   Status post biventricular pacemaker - MRI compatible PPM and leads 01/22/2022   Chronic indwelling Foley catheter 01/22/2022   CKD (chronic kidney disease), stage III - IV ([place]) 06/28/2021   Chronic HFrEF (heart failure with reduced ejection fraction) ([place]) 03/31/2021   Arrhythmia    DM (diabetes mellitus), type 2 ([place]) new 10/26/2020   Urinary retention 10/26/2020   Hyperlipidemia LDL goal <70 10/26/2020   Coronary artery disease of native artery of native heart with stable angina pectoris ([place]) 10/26/2020   S/P angioplasty with stent 10/04/20 DES to LCX  10/26/2020   Ischemic cardiomyopathy 10/26/2020   Diabetes mellitus, type 2 ([place]) 10/06/2020   DOE (dyspnea on exertion)    Meningioma ([place]) 08/14/2020   Vestibular schwannoma ([place]) 08/14/2020   Cardiac arrest with ventricular fibrillation (HCC)    Pain of left hip joint 05/29/2020   Benign brain tumor ([place])    Chronic kidney disease    History of  pulmonary embolus (PE)    Hypertension    Nonsustained ventricular tachycardia ([place]) 10/11/2019   Dizziness 10/11/2019   Abnormal echocardiogram 06/07/2019   Pulmonary hypertension due to thromboembolism ([place]) 03/09/2018   Solitary pulmonary nodule on lung CT 03/08/2018   Ascending aortic aneurysm (HCC) 4.2 cm based on CT from December 2020 01/29/2018   Sinus bradycardia 12/22/2017   Left bundle branch block 12/19/2017   Personal history of pulmonary embolism - no longer on anticoagulation due to pt refusal to take it anymore. 12/19/2017   Chronic kidney disease, stage III (moderate) ([place]) 12/19/2017   Benign neoplasm of brain ([place]) 12/19/2017    ONSET DATE: 02/01/2022   REFERRING DIAG: Diagnosis R53.1 (ICD-10-CM) - Right sided weakness D32.9 (ICD-10-CM) - Meningioma (HCC)   THERAPY DIAG:  Other lack of coordination  Muscle weakness (generalized)  Other symptoms  and signs involving the musculoskeletal system  Unsteadiness on feet  Right sided weakness  Other abnormalities of gait and mobility  Fall in home, initial encounter  Rationale for Evaluation and Treatment Rehabilitation  SUBJECTIVE:                                                                                                                                                                                              SUBJECTIVE STATEMENT: Patient reports no real changes.   PERTINENT HISTORY: Brief HPI:   Solomon Skowronek is a 76 y.o. male who fell backward striking his head and right neck shoulder area on 01/21/2022. He complained of right hemiparesis. CT head revealed left parafalcine meningioma.Started on Decadron with improvement in symptoms   PAIN:  Are you having pain? No  PRECAUTIONS: None  WEIGHT BEARING RESTRICTIONS No  FALLS: Has patient fallen in last 6 months? Yes. Number of falls 1  LIVING ENVIRONMENT: Lives with: lives with their spouse Lives in: House/apartment Stairs: Yes: External: 7  steps; bilateral but cannot reach both Has following equipment at home: Walker - 2 wheeled  PLOF: [place] Patient would like to return to normal. Able to participate in all activities, he was not limited before.  OBJECTIVE:  MUSCLE LENGTH: Hamstrings: Right 72 deg; Left 78 deg Eval   POSTURE: rounded shoulders  LOWER EXTREMITY ROM:   PROM WFL in LE   LOWER EXTREMITY MMT:    MMT Right Eval Left Eval  Hip flexion 4 4  Hip extension 4- 4  Hip abduction 4- 4  Hip adduction    Hip internal rotation    Hip external rotation    Knee flexion 4 4  Knee extension 4 4  Ankle dorsiflexion 4 4  Ankle plantarflexion    Ankle inversion    Ankle eversion    (Blank rows = not tested)   FUNCTIONAL TESTs:  EVAL 5 times sit to stand: 16.97 Timed up and go (TUG): 14.79 Functional gait assessment: 13/30  TODAY'S TREATMENT:    03/03/22 Nu-Step L5 x 6 minutes Step taps on 6" step while holding 4# weight in BUE, forward, side taps x 10 each, Attempted tapping with rotation. Patient was able to tap out to the side and then rotate to bring foot closer to midline, unable to reach midline without LOB.  Sat on Physiodisc for lower trunk mobilization and stabilization, ant/post, lat, clocks x 5 each way. Resisted gait with 30#, 5 reps each direction.  03/01/22 Nu-Step L5 x 6 minutes Lateral push on treadmill, 2 x 10 in each direction, BUE support Alternating step ups onto  6" step with B rails. Mod balance difficulty, required minA x 4 due to LOB and he recovered by himself at other times. 10 reps each leg. SLS with opposite foot on 6" step. Mini squats and rotation around stance leg without UE support, 5 of each, performed with each leg and attempted no UE support, required occasional grab of handrail and occasional min A. Ambulated while holsing 4# weight in BUE, with much more stable gait, less balance loss, 150', with multiple turns, CGA. Returned to steps and alternate  taps, this time holding 4# weight in BUE. He completed 10 alternate taps and then 10 x 3 taps with each foot on 6" step without UE support, much more stable.     02/24/22 Nu-Step L5 x 6 minutes B side step onto and off of Airex Pad, occasional min A for balance. 10 reps each way, improved with repetition. Standing in tandem in parallel bars. Once stable, he performed forward and back weight shifts and then rotation. He was very unsteady initially, improved with active stabilization through his hips. Attempted standing on upside down BOSU, patient was too unstable. Moved to rocker board, ant/post and then laterally. Very shaky initially, with poor ankle control, but he did improve with repetition.  02/17/22   Recumbent bike 4.0 x 6 minutes.   Bridge with hip abd against Red Tband 2 x 10   SLR, SLR with ER, 1 x 10 reps with 2# resistance.   Supine march x 10   Standing sh ext, rows, ER 10 reps each G Tband Standing 3 way kick with UE support, G Tband 10 reps    Tx. 02/15/2022   NuStep L5 x6 min  Sit to stands UE assist elevated mat 2x10  4in step HHA x1 2x10 Rows green 2x15 Shoulder Extensions green 2x15 Leg curls 35lb 2x10   Leg Ext 10lb 2x10   Bridges x10 x5    SLR 2x10 each    Hooklying ball squeeze x15   PATIENT EDUCATION: Education details: HEP, rest between exercises Person educated: patient Education method: Explanation, demo, handout Education comprehension: verbalized understanding   HOME EXERCISE PROGRAM: Access Code: [email or web address] URL: [email or web address] Date: 02/17/2022     GOALS: Goals reviewed with patient? Yes  SHORT TERM GOALS: Target date: 03/10/2022  I with initial HEP Baseline: Initiated Goal status: ongoing   2.  FGA improved to 16/30 Baseline: 13 Goal status: INITIAL   LONG TERM GOALS: Target date: 04/21/2022  I with final HEP Baseline:  Goal status: INITIAL  2.  Paitent will complete 5x STS in < 12 seconds to demosntrate  improved balance and LE strength. Baseline: 16.9 with unsteadiness each time he rises. Goal status: INITIAL  3.  Patient will ambulate x at least 1000' on level and unlevel surfaces I, no loss of balance or unsteadiness noted. Baseline: Ataxic, unsteady, 100' Goal status: INITIAL  4.  Complete TUG in < 10 sec to demosntrate improved balance Baseline: 14.79 Goal status: INITIAL  5.  Patient will score at least 21/27 Baseline: 13/27-Patient cannot perform heel toe gait. Goal status: INITIAL    ASSESSMENT:  CLINICAL IMPRESSION: Patient reports no issues. Continued to challenge movement with resistance to increase feedback and stability. He tolerated all activities well, continues to fell unsteady.  OBJECTIVE IMPAIRMENTS Abnormal gait, decreased balance, decreased coordination, decreased endurance, decreased mobility, difficulty walking, decreased ROM, decreased strength, impaired flexibility, improper body mechanics, and postural dysfunction.   ACTIVITY LIMITATIONS carrying, lifting, squatting, stairs, transfers, reach over head, and  locomotion level  PARTICIPATION LIMITATIONS: cleaning, laundry, shopping, and yard work  PERSONAL FACTORS Past/current experiences and 1 comorbidity: L meningioma  are also affecting patient's functional outcome.   REHAB POTENTIAL: Good  CLINICAL DECISION MAKING: Evolving/moderate complexity  EVALUATION COMPLEXITY: Moderate  PLAN: PT FREQUENCY: 2x/week  PT DURATION: 10 weeks  PLANNED INTERVENTIONS: Therapeutic exercises, Therapeutic activity, Neuromuscular re-education, Balance training, Gait training, Patient/Family education, Joint mobilization, Stair training, and Manual therapy  PLAN FOR NEXT SESSION:  strengthening, balance training   Marcelina Morel, DPT 03/03/2022, 10:59 AM

## 2022-03-09 ENCOUNTER — Ambulatory Visit: Payer: Medicare Other | Admitting: Physical Therapy

## 2022-03-09 ENCOUNTER — Encounter: Payer: Self-pay | Admitting: Physical Therapy

## 2022-03-09 DIAGNOSIS — R2689 Other abnormalities of gait and mobility: Secondary | ICD-10-CM

## 2022-03-09 DIAGNOSIS — M6281 Muscle weakness (generalized): Secondary | ICD-10-CM | POA: Diagnosis not present

## 2022-03-09 DIAGNOSIS — R29898 Other symptoms and signs involving the musculoskeletal system: Secondary | ICD-10-CM

## 2022-03-09 DIAGNOSIS — D329 Benign neoplasm of meninges, unspecified: Secondary | ICD-10-CM | POA: Diagnosis not present

## 2022-03-09 DIAGNOSIS — R278 Other lack of coordination: Secondary | ICD-10-CM

## 2022-03-09 DIAGNOSIS — R2681 Unsteadiness on feet: Secondary | ICD-10-CM

## 2022-03-09 DIAGNOSIS — M25511 Pain in right shoulder: Secondary | ICD-10-CM | POA: Diagnosis not present

## 2022-03-09 NOTE — Therapy (Signed)
OUTPATIENT PHYSICAL THERAPY NEURO EVALUATION   Patient Name: Timothy Moran MRN: [id number] DOB:[birth year], 76 y.o., male Today's Date: 03/09/2022   PCP: McNeill, Vance PROVIDER: Barbie Banner, PA-C    PT End of Session - 03/09/22 1506     Visit Number 7    Date for PT Re-Evaluation 04/21/22    PT Start Time 1502    PT Stop Time 5621    PT Time Calculation (min) 40 min    Equipment Utilized During Treatment Gait belt    Activity Tolerance Patient tolerated treatment well    Behavior During Therapy [facility] for tasks assessed/performed                  Past Medical History:  Diagnosis Date   Acute blood loss anemia 10/05/2020   Acute respiratory failure ([place]), hypothermia therapy, vent - extubated 10/06/20    AKI (acute kidney injury) ([place]) 10/05/2020   Anoxic brain injury ([place]) 10/26/2020   Arrhythmia    Ascending aortic aneurysm ([place]) 01/29/2018   a. 2019 4.3 cm by echo; b. 02/2021 Echo: Ao root 79m.   Benign brain tumor ([place]    Benign neoplasm of brain ([place] 12/19/2017   Cardiac arrest with ventricular fibrillation ([place]    Chest pain 12/04/2017   Chronic kidney disease, stage III (moderate) ([place] 12/19/2017   CKD (chronic kidney disease), stage III - IV (HCC)    Coronary Artery Disease    a. 13/0865Inf STEMI complicated by cardiac arrest/PCI: LAD 50p/m, LCX 100d (2.5 x 30 Resolute Onyx DES); b. 04/2021 PCI: 04/2021 LM nl, LAD 50p/870m2.5x30 Onyx Frontier DES), LCX 25p, 5026mFR 0.98), 40d ISR (RFR 0.98), OM2 25, RCA mild diff dzs.   Diabetes mellitus, type 2 ([facility]1/25/2022   10/06/20 A1C 7%   Dizziness 10/11/2019   Encounter for imaging study to confirm orogastric (OG) tube placement    Hematuria 10/26/2020   HFrEF (heart failure with reduced ejection fraction) ([place]  a. 11/2020 Echo: EF 25-30%; b. 02/2021 Echo: EF 25-30%, glob HK. Mild LVH. Nl RV fxn. Triv AI. Ao root 17m18m History of pulmonary embolus (PE)    HLD (hyperlipidemia) 10/26/2020    Hypertension    Iron deficiency anemia rec'd IV iron 10/26/2020   Ischemic cardiomyopathy    a. 11/2020 Echo: EF 25-30%; b. 02/2021 Echo: EF 25-30%, glob HK.   Left bundle branch block 12/19/2017   Meningioma (HCC)[place]/11/2019   Nonsustained ventricular tachycardia (HCC)[place]/29/2021   Pain of left hip joint 05/29/2020   Personal history of pulmonary embolism 12/19/2017   Pneumonia of both lungs due to infectious organism    Pulmonary hypertension due to thromboembolism (HCC)[place]/28/2019   See echo 12/27/17 with nl PAS vs CTa chest 02/23/18    S/P angioplasty with stent 10/04/20 DES to LCX  10/26/2020   Sinus bradycardia 12/22/2017   SOB (shortness of breath)    Solitary pulmonary nodule on lung CT 03/08/2018   CT 12/04/17 1.0 x 0.8 x 0.7 cm nodular opacity in the RUL vs not seen 03/16/10 posterior segment of the right upper lobe. .[place]pi[place] Kitchenometry 03/08/2018    FEV1 3.86 (108%)  Ratio 96 s prior rx  - PET  03/13/18   Low grade c/w adenoca > rec T surgery eval     STEMI (ST elevation myocardial infarction) (HCC)[place]/23/2022   Urinary retention 10/26/2020   Vestibular schwannoma (HCC)[place]/11/2019   Past Surgical History:  Procedure Laterality Date   APPENDECTOMY  BIV ICD INSERTION CRT-D N/A 11/03/2021   Procedure: BIV ICD INSERTION CRT-D;  Surgeon: Constance Haw, MD;  Location: [place] [facility];  Service: Cardiovascular;  Laterality: N/A;   CARDIAC CATHETERIZATION     CORONARY ANGIOPLASTY WITH STENT PLACEMENT Left 04/12/2021   LAD stent placement   CORONARY STENT INTERVENTION N/A 04/12/2021   Procedure: CORONARY STENT INTERVENTION;  Surgeon: Nelva Bush, MD;  Location: [place] CV LAB;  Service: Cardiovascular;  Laterality: N/A;   CORONARY/GRAFT ACUTE MI REVASCULARIZATION N/A 10/04/2020   Procedure: Coronary/Graft Acute MI Revascularization;  Surgeon: Nelva Bush, MD;  Location: [place] CV LAB;  Service: Cardiovascular;  Laterality: N/A;   HERNIA REPAIR     INTRAVASCULAR  PRESSURE WIRE/FFR STUDY N/A 04/12/2021   Procedure: INTRAVASCULAR PRESSURE WIRE/FFR STUDY;  Surgeon: Nelva Bush, MD;  Location: [place] CV LAB;  Service: Cardiovascular;  Laterality: N/A;   INTRAVASCULAR ULTRASOUND/IVUS N/A 04/12/2021   Procedure: Intravascular Ultrasound/IVUS;  Surgeon: Nelva Bush, MD;  Location: [place] CV LAB;  Service: Cardiovascular;  Laterality: N/A;   LEFT HEART CATH AND CORONARY ANGIOGRAPHY N/A 10/04/2020   Procedure: LEFT HEART CATH AND CORONARY ANGIOGRAPHY;  Surgeon: Nelva Bush, MD;  Location: [place] CV LAB;  Service: Cardiovascular;  Laterality: N/A;   RIGHT/LEFT HEART CATH AND CORONARY ANGIOGRAPHY N/A 04/12/2021   Procedure: RIGHT/LEFT HEART CATH AND CORONARY ANGIOGRAPHY;  Surgeon: Nelva Bush, MD;  Location: [place] CV LAB;  Service: Cardiovascular;  Laterality: N/A;   Patient Active Problem List   Diagnosis Date Noted   Anemia    Leukocytosis    Right arm weakness 01/25/2022   Right sided weakness    Fall at home 01/22/2022   Status post biventricular pacemaker - MRI compatible PPM and leads 01/22/2022   Chronic indwelling Foley catheter 01/22/2022   CKD (chronic kidney disease), stage III - IV ([place]) 06/28/2021   Chronic HFrEF (heart failure with reduced ejection fraction) ([place]) 03/31/2021   Arrhythmia    DM (diabetes mellitus), type 2 ([place]) new 10/26/2020   Urinary retention 10/26/2020   Hyperlipidemia LDL goal <70 10/26/2020   Coronary artery disease of native artery of native heart with stable angina pectoris ([place]) 10/26/2020   S/P angioplasty with stent 10/04/20 DES to LCX  10/26/2020   Ischemic cardiomyopathy 10/26/2020   Diabetes mellitus, type 2 ([place]) 10/06/2020   DOE (dyspnea on exertion)    Meningioma ([place]) 08/14/2020   Vestibular schwannoma ([place]) 08/14/2020   Cardiac arrest with ventricular fibrillation (HCC)    Pain of left hip joint 05/29/2020   Benign brain tumor ([place])    Chronic kidney disease    History  of pulmonary embolus (PE)    Hypertension    Nonsustained ventricular tachycardia ([place]) 10/11/2019   Dizziness 10/11/2019   Abnormal echocardiogram 06/07/2019   Pulmonary hypertension due to thromboembolism ([place]) 03/09/2018   Solitary pulmonary nodule on lung CT 03/08/2018   Ascending aortic aneurysm (HCC) 4.2 cm based on CT from December 2020 01/29/2018   Sinus bradycardia 12/22/2017   Left bundle branch block 12/19/2017   Personal history of pulmonary embolism - no longer on anticoagulation due to pt refusal to take it anymore. 12/19/2017   Chronic kidney disease, stage III (moderate) ([place]) 12/19/2017   Benign neoplasm of brain ([place]) 12/19/2017    ONSET DATE: 02/01/2022   REFERRING DIAG: Diagnosis R53.1 (ICD-10-CM) - Right sided weakness D32.9 (ICD-10-CM) - Meningioma (HCC)   THERAPY DIAG:  Other lack of coordination  Muscle weakness (generalized)  Other symptoms  and signs involving the musculoskeletal system  Unsteadiness on feet  Other abnormalities of gait and mobility  Meningioma [facility])  Rationale for Evaluation and Treatment Rehabilitation  SUBJECTIVE:                                                                                                                                                                                              SUBJECTIVE STATEMENT: Patient reports no real changes. He still feels unsteady, especially when he first stands or when he exerts himself. He had his defibrillator adjusted.  PERTINENT HISTORY: Brief HPI:   Smaran Gaus is a 76 y.o. male who fell backward striking his head and right neck shoulder area on 01/21/2022. He complained of right hemiparesis. CT head revealed left parafalcine meningioma.Started on Decadron with improvement in symptoms   PAIN:  Are you having pain? No  PRECAUTIONS: None  WEIGHT BEARING RESTRICTIONS No  FALLS: Has patient fallen in last 6 months? Yes. Number of falls 1  LIVING ENVIRONMENT: Lives  with: lives with their spouse Lives in: House/apartment Stairs: Yes: External: 7 steps; bilateral but cannot reach both Has following equipment at home: Walker - 2 wheeled  PLOF: [place] Patient would like to return to normal. Able to participate in all activities, he was not limited before.  OBJECTIVE:  MUSCLE LENGTH: Hamstrings: Right 72 deg; Left 78 deg Eval   POSTURE: rounded shoulders  LOWER EXTREMITY ROM:   PROM WFL in LE   LOWER EXTREMITY MMT:    MMT Right Eval Left Eval  Hip flexion 4 4  Hip extension 4- 4  Hip abduction 4- 4  Hip adduction    Hip internal rotation    Hip external rotation    Knee flexion 4 4  Knee extension 4 4  Ankle dorsiflexion 4 4  Ankle plantarflexion    Ankle inversion    Ankle eversion    (Blank rows = not tested)   FUNCTIONAL TESTs:  EVAL 5 times sit to stand: 16.97 Timed up and go (TUG): 14.79 Functional gait assessment: 13/30  TODAY'S TREATMENT:    03/09/22 Nu-Step L5 x 6 minutes. Balance training on floor mat-B side stepping, Alternate taps on 2" cushion on top of mat, progressed to 3 taps with each foot, 10 reps each- mild unsteadiness, patient mostly able to recover I. Wall bumps to facilitate improved control with backward weight shifts. As his feet moved progressively further from the wall, he struggled to control his posterior weight shift. Standing on floor mat, performed weight shifts forward and back, continued to have difficulty with control of weight shifts, occasional min  A for balance. Toe walking, heel walking, 3 x 15' each, CGA. Walked while holding a 4# ball out in front with BUE to increase his stability and control. He walked with much less ataxia and improved stability.   03/03/22 Nu-Step L5 x 6 minutes Step taps on 6" step while holding 4# weight in BUE, forward, side taps x 10 each, Attempted tapping with rotation. Patient was able to tap out to the side and then rotate to bring foot  closer to midline, unable to reach midline without LOB.  Sat on Physiodisc for lower trunk mobilization and stabilization, ant/post, lat, clocks x 5 each way. Resisted gait with 30#, 5 reps each direction.  03/01/22 Nu-Step L5 x 6 minutes Lateral push on treadmill, 2 x 10 in each direction, BUE support Alternating step ups onto 6" step with B rails. Mod balance difficulty, required minA x 4 due to LOB and he recovered by himself at other times. 10 reps each leg. SLS with opposite foot on 6" step. Mini squats and rotation around stance leg without UE support, 5 of each, performed with each leg and attempted no UE support, required occasional grab of handrail and occasional min A. Ambulated while holsing 4# weight in BUE, with much more stable gait, less balance loss, 150', with multiple turns, CGA. Returned to steps and alternate taps, this time holding 4# weight in BUE. He completed 10 alternate taps and then 10 x 3 taps with each foot on 6" step without UE support, much more stable.     02/24/22 Nu-Step L5 x 6 minutes B side step onto and off of Airex Pad, occasional min A for balance. 10 reps each way, improved with repetition. Standing in tandem in parallel bars. Once stable, he performed forward and back weight shifts and then rotation. He was very unsteady initially, improved with active stabilization through his hips. Attempted standing on upside down BOSU, patient was too unstable. Moved to rocker board, ant/post and then laterally. Very shaky initially, with poor ankle control, but he did improve with repetition.  02/17/22   Recumbent bike 4.0 x 6 minutes.   Bridge with hip abd against Red Tband 2 x 10   SLR, SLR with ER, 1 x 10 reps with 2# resistance.   Supine march x 10   Standing sh ext, rows, ER 10 reps each G Tband Standing 3 way kick with UE support, G Tband 10 reps    Tx. 02/15/2022   NuStep L5 x6 min  Sit to stands UE assist elevated mat 2x10  4in step HHA x1 2x10 Rows  green 2x15 Shoulder Extensions green 2x15 Leg curls 35lb 2x10   Leg Ext 10lb 2x10   Bridges x10 x5    SLR 2x10 each    Hooklying ball squeeze x15   PATIENT EDUCATION: Education details: HEP, rest between exercises Person educated: patient Education method: Explanation, demo, handout Education comprehension: verbalized understanding   HOME EXERCISE PROGRAM: Access Code: [email or web address] URL: [email or web address] Date: 02/17/2022     GOALS: Goals reviewed with patient? Yes  SHORT TERM GOALS: Target date: 03/10/2022  I with initial HEP Baseline: Initiated Goal status: ongoing   2.  FGA improved to 16/30 Baseline: 13 Goal status: INITIAL   LONG TERM GOALS: Target date: 04/21/2022  I with final HEP Baseline:  Goal status: INITIAL  2.  Paitent will complete 5x STS in < 12 seconds to demosntrate improved balance and LE strength. Baseline: 16.9 with unsteadiness each time he rises. Goal status:  INITIAL  3.  Patient will ambulate x at least 1000' on level and unlevel surfaces I, no loss of balance or unsteadiness noted. Baseline: Ataxic, unsteady, 100' Goal status: INITIAL  4.  Complete TUG in < 10 sec to demosntrate improved balance Baseline: 14.79 Goal status: INITIAL  5.  Patient will score at least 21/27 Baseline: 13/27-Patient cannot perform heel toe gait. Goal status: INITIAL    ASSESSMENT:  CLINICAL IMPRESSION: Patient reports no issues. Continued to challenge movement with resistance to increase feedback and stability. He tolerated all activities well, continues to fell unsteady.  OBJECTIVE IMPAIRMENTS Abnormal gait, decreased balance, decreased coordination, decreased endurance, decreased mobility, difficulty walking, decreased ROM, decreased strength, impaired flexibility, improper body mechanics, and postural dysfunction.   ACTIVITY LIMITATIONS carrying, lifting, squatting, stairs, transfers, reach over head, and locomotion  level  PARTICIPATION LIMITATIONS: cleaning, laundry, shopping, and yard work  [place] Past/current experiences and 1 comorbidity: L meningioma  are also affecting patient's functional outcome.   REHAB POTENTIAL: Good  CLINICAL DECISION MAKING: Evolving/moderate complexity  EVALUATION COMPLEXITY: Moderate  PLAN: PT FREQUENCY: 2x/week  PT DURATION: 10 weeks  PLANNED INTERVENTIONS: Therapeutic exercises, Therapeutic activity, Neuromuscular re-education, Balance training, Gait training, Patient/Family education, Joint mobilization, Stair training, and Manual therapy  PLAN FOR NEXT SESSION:  strengthening, balance training   Marcelina Morel, DPT 03/09/2022, 3:42 PM

## 2022-03-11 ENCOUNTER — Encounter: Payer: Self-pay | Admitting: Physical Therapy

## 2022-03-11 ENCOUNTER — Ambulatory Visit: Payer: Medicare Other | Admitting: Physical Therapy

## 2022-03-11 DIAGNOSIS — D329 Benign neoplasm of meninges, unspecified: Secondary | ICD-10-CM | POA: Diagnosis not present

## 2022-03-11 DIAGNOSIS — R2689 Other abnormalities of gait and mobility: Secondary | ICD-10-CM | POA: Diagnosis not present

## 2022-03-11 DIAGNOSIS — M6281 Muscle weakness (generalized): Secondary | ICD-10-CM

## 2022-03-11 DIAGNOSIS — M25511 Pain in right shoulder: Secondary | ICD-10-CM | POA: Diagnosis not present

## 2022-03-11 DIAGNOSIS — R29898 Other symptoms and signs involving the musculoskeletal system: Secondary | ICD-10-CM | POA: Diagnosis not present

## 2022-03-11 DIAGNOSIS — R278 Other lack of coordination: Secondary | ICD-10-CM | POA: Diagnosis not present

## 2022-03-11 DIAGNOSIS — R2681 Unsteadiness on feet: Secondary | ICD-10-CM

## 2022-03-11 DIAGNOSIS — R531 Weakness: Secondary | ICD-10-CM

## 2022-03-11 NOTE — Therapy (Signed)
OUTPATIENT PHYSICAL THERAPY NEURO EVALUATION   Patient Name: Timothy Moran MRN: [id number] DOB:1946-03-30, 76 y.o., male Today's Date: 03/11/2022   PCP: Cari Caraway REFERRING PROVIDER: Barbie Banner, PA-C    PT End of Session - 03/11/22 1105     Visit Number 8    Date for PT Re-Evaluation 04/21/22    PT Start Time 1101    PT Stop Time 1141    PT Time Calculation (min) 40 min    Equipment Utilized During Treatment Gait belt    Activity Tolerance Patient tolerated treatment well    Behavior During Therapy [facility] for tasks assessed/performed                   Past Medical History:  Diagnosis Date   Acute blood loss anemia 10/05/2020   Acute respiratory failure ([place]), hypothermia therapy, vent - extubated 10/06/20    AKI (acute kidney injury) ([place]) 10/05/2020   Anoxic brain injury ([place]) 10/26/2020   Arrhythmia    Ascending aortic aneurysm ([place]) 01/29/2018   a. 2019 4.3 cm by echo; b. 02/2021 Echo: Ao root 22mm.   Benign brain tumor ([place])    Benign neoplasm of brain ([place]) 12/19/2017   Cardiac arrest with ventricular fibrillation ([place])    Chest pain 12/04/2017   Chronic kidney disease, stage III (moderate) ([place]) 12/19/2017   CKD (chronic kidney disease), stage III - IV (HCC)    Coronary Artery Disease    a. 05/3715 Inf STEMI complicated by cardiac arrest/PCI: LAD 50p/m, LCX 100d (2.5 x 30 Resolute Onyx DES); b. 04/2021 PCI: 04/2021 LM nl, LAD 50p/15m (2.5x30 Onyx Frontier DES), LCX 25p, 56m (RFR 0.98), 40d ISR (RFR 0.98), OM2 25, RCA mild diff dzs.   Diabetes mellitus, type 2 ([place]) 10/06/2020   10/06/20 A1C 7%   Dizziness 10/11/2019   Encounter for imaging study to confirm orogastric (OG) tube placement    Hematuria 10/26/2020   HFrEF (heart failure with reduced ejection fraction) ([place])    a. 11/2020 Echo: EF 25-30%; b. 02/2021 Echo: EF 25-30%, glob HK. Mild LVH. Nl RV fxn. Triv AI. Ao root 16mm.   History of pulmonary embolus (PE)    HLD (hyperlipidemia)  10/26/2020   Hypertension    Iron deficiency anemia rec'd IV iron 10/26/2020   Ischemic cardiomyopathy    a. 11/2020 Echo: EF 25-30%; b. 02/2021 Echo: EF 25-30%, glob HK.   Left bundle branch block 12/19/2017   Meningioma ([place]) 08/14/2020   Nonsustained ventricular tachycardia ([place]) 10/11/2019   Pain of left hip joint 05/29/2020   Personal history of pulmonary embolism 12/19/2017   Pneumonia of both lungs due to infectious organism    Pulmonary hypertension due to thromboembolism ([place]) 03/09/2018   See echo 12/27/17 with nl PAS vs CTa chest 02/23/18    S/P angioplasty with stent 10/04/20 DES to LCX  10/26/2020   Sinus bradycardia 12/22/2017   SOB (shortness of breath)    Solitary pulmonary nodule on lung CT 03/08/2018   CT 12/04/17 1.0 x 0.8 x 0.7 cm nodular opacity in the RUL vs not seen 03/16/10 posterior segment of the right upper lobe. [place] KitchenSpirometry 03/08/2018    FEV1 3.86 (108%)  Ratio 96 s prior rx  - PET  03/13/18   Low grade c/w adenoca > rec T surgery eval     STEMI (ST elevation myocardial infarction) ([place]) 10/04/2020   Urinary retention 10/26/2020   Vestibular schwannoma ([place]) 08/14/2020   Past Surgical History:  Procedure Laterality Date  APPENDECTOMY     BIV ICD INSERTION CRT-D N/A 11/03/2021   Procedure: BIV ICD INSERTION CRT-D;  Surgeon: Constance Haw, MD;  Location: [place] [facility];  Service: Cardiovascular;  Laterality: N/A;   CARDIAC CATHETERIZATION     CORONARY ANGIOPLASTY WITH STENT PLACEMENT Left 04/12/2021   LAD stent placement   CORONARY STENT INTERVENTION N/A 04/12/2021   Procedure: CORONARY STENT INTERVENTION;  Surgeon: Nelva Bush, MD;  Location: [place] [facility];  Service: Cardiovascular;  Laterality: N/A;   CORONARY/GRAFT ACUTE MI REVASCULARIZATION N/A 10/04/2020   Procedure: Coronary/Graft Acute MI Revascularization;  Surgeon: Nelva Bush, MD;  Location: [place] [facility];  Service: Cardiovascular;  Laterality: N/A;   HERNIA REPAIR      INTRAVASCULAR PRESSURE WIRE/FFR STUDY N/A 04/12/2021   Procedure: INTRAVASCULAR PRESSURE WIRE/FFR STUDY;  Surgeon: Nelva Bush, MD;  Location: [place] [facility];  Service: Cardiovascular;  Laterality: N/A;   INTRAVASCULAR ULTRASOUND/IVUS N/A 04/12/2021   Procedure: Intravascular Ultrasound/IVUS;  Surgeon: Nelva Bush, MD;  Location: [place] [facility];  Service: Cardiovascular;  Laterality: N/A;   LEFT HEART CATH AND CORONARY ANGIOGRAPHY N/A 10/04/2020   Procedure: LEFT HEART CATH AND CORONARY ANGIOGRAPHY;  Surgeon: Nelva Bush, MD;  Location: [place] [facility];  Service: Cardiovascular;  Laterality: N/A;   RIGHT/LEFT HEART CATH AND CORONARY ANGIOGRAPHY N/A 04/12/2021   Procedure: RIGHT/LEFT HEART CATH AND CORONARY ANGIOGRAPHY;  Surgeon: Nelva Bush, MD;  Location: [place] [facility];  Service: Cardiovascular;  Laterality: N/A;   Patient Active Problem List   Diagnosis Date Noted   Anemia    Leukocytosis    Right arm weakness 01/25/2022   Right sided weakness    Fall at home 01/22/2022   Status post biventricular pacemaker - MRI compatible PPM and leads 01/22/2022   Chronic indwelling Foley catheter 01/22/2022   CKD (chronic kidney disease), stage III - IV ([place]) 06/28/2021   Chronic HFrEF (heart failure with reduced ejection fraction) ([place]) 03/31/2021   Arrhythmia    DM (diabetes mellitus), type 2 ([place]) new 10/26/2020   Urinary retention 10/26/2020   Hyperlipidemia LDL goal <70 10/26/2020   Coronary artery disease of native artery of native heart with stable angina pectoris ([place]) 10/26/2020   S/P angioplasty with stent 10/04/20 DES to LCX  10/26/2020   Ischemic cardiomyopathy 10/26/2020   Diabetes mellitus, type 2 ([place]) 10/06/2020   DOE (dyspnea on exertion)    Meningioma ([place]) 08/14/2020   Vestibular schwannoma ([place]) 08/14/2020   Cardiac arrest with ventricular fibrillation (HCC)    Pain of left hip joint 05/29/2020   Benign brain tumor ([place])    Chronic kidney  disease    History of pulmonary embolus (PE)    Hypertension    Nonsustained ventricular tachycardia ([place]) 10/11/2019   Dizziness 10/11/2019   Abnormal echocardiogram 06/07/2019   Pulmonary hypertension due to thromboembolism ([place]) 03/09/2018   Solitary pulmonary nodule on lung CT 03/08/2018   Ascending aortic aneurysm (HCC) 4.2 cm based on CT from December 2020 01/29/2018   Sinus bradycardia 12/22/2017   Left bundle branch block 12/19/2017   Personal history of pulmonary embolism - no longer on anticoagulation due to pt refusal to take it anymore. 12/19/2017   Chronic kidney disease, stage III (moderate) ([place]) 12/19/2017   Benign neoplasm of brain ([place]) 12/19/2017    ONSET DATE: 02/01/2022   REFERRING DIAG: Diagnosis R53.1 (ICD-10-CM) - Right sided weakness D32.9 (ICD-10-CM) - Meningioma (HCC)   THERAPY DIAG:  Other abnormalities of gait and mobility  Meningioma (HCC)  Right sided weakness  Muscle weakness (generalized)  Other lack of coordination  Unsteadiness on feet  Rationale for Evaluation and Treatment Rehabilitation  SUBJECTIVE:                                                                                                                                                                                              SUBJECTIVE STATEMENT: Patient reports no real changes.   PERTINENT HISTORY: Brief HPI:   Nikia Mangino is a 76 y.o. male who fell backward striking his head and right neck shoulder area on 01/21/2022. He complained of right hemiparesis. CT head revealed left parafalcine meningioma.Started on Decadron with improvement in symptoms   PAIN:  Are you having pain? No  PRECAUTIONS: None  WEIGHT BEARING RESTRICTIONS No  FALLS: Has patient fallen in last 6 months? Yes. Number of falls 1  LIVING ENVIRONMENT: Lives with: lives with their spouse Lives in: House/apartment Stairs: Yes: External: 7 steps; bilateral but cannot reach both Has following  equipment at home: Walker - 2 wheeled  PLOF: [place] Patient would like to return to normal. Able to participate in all activities, he was not limited before.  OBJECTIVE:  MUSCLE LENGTH: Hamstrings: Right 72 deg; Left 78 deg Eval   POSTURE: rounded shoulders  LOWER EXTREMITY ROM:   PROM WFL in LE   LOWER EXTREMITY MMT:    MMT Right Eval Left Eval  Hip flexion 4 4  Hip extension 4- 4  Hip abduction 4- 4  Hip adduction    Hip internal rotation    Hip external rotation    Knee flexion 4 4  Knee extension 4 4  Ankle dorsiflexion 4 4  Ankle plantarflexion    Ankle inversion    Ankle eversion    (Blank rows = not tested)   FUNCTIONAL TESTs:  EVAL 5 times sit to stand: 16.97 Timed up and go (TUG): 14.79 Functional gait assessment: 13/30  TODAY'S TREATMENT:    03/11/22 Recumbent bike 4.1 x 4 minutes, then 3 x 30 sec fast, > 80 RPM FGA re-assess 17/20 TG met Balance training on Air ex ant/post weight shifts, then marching, UUE support, held standing knee in slight flexion to increase stability. Sitting on physiodisc to mobilize lower trunk. Ant/Post, Lat, and then pelvic clocks in each direction, 10 reps each Standing rows with 20#, SH ext with 15#, sh ER with 10# resistance at power tower. 2 x 10 reps  03/09/22 Nu-Step L5 x 6 minutes. Balance training on floor mat-B side stepping, Alternate taps on 2" cushion on top of mat, progressed to 3  taps with each foot, 10 reps each- mild unsteadiness, patient mostly able to recover I. Wall bumps to facilitate improved control with backward weight shifts. As his feet moved progressively further from the wall, he struggled to control his posterior weight shift. Standing on floor mat, performed weight shifts forward and back, continued to have difficulty with control of weight shifts, occasional min A for balance. Toe walking, heel walking, 3 x 15' each, CGA. Walked while holding a 4# ball out in front with BUE to  increase his stability and control. He walked with much less ataxia and improved stability.   03/03/22 Nu-Step L5 x 6 minutes Step taps on 6" step while holding 4# weight in BUE, forward, side taps x 10 each, Attempted tapping with rotation. Patient was able to tap out to the side and then rotate to bring foot closer to midline, unable to reach midline without LOB.  Sat on Physiodisc for lower trunk mobilization and stabilization, ant/post, lat, clocks x 5 each way. Resisted gait with 30#, 5 reps each direction.  03/01/22 Nu-Step L5 x 6 minutes Lateral push on treadmill, 2 x 10 in each direction, BUE support Alternating step ups onto 6" step with B rails. Mod balance difficulty, required minA x 4 due to LOB and he recovered by himself at other times. 10 reps each leg. SLS with opposite foot on 6" step. Mini squats and rotation around stance leg without UE support, 5 of each, performed with each leg and attempted no UE support, required occasional grab of handrail and occasional min A. Ambulated while holsing 4# weight in BUE, with much more stable gait, less balance loss, 150', with multiple turns, CGA. Returned to steps and alternate taps, this time holding 4# weight in BUE. He completed 10 alternate taps and then 10 x 3 taps with each foot on 6" step without UE support, much more stable.     02/24/22 Nu-Step L5 x 6 minutes B side step onto and off of Airex Pad, occasional min A for balance. 10 reps each way, improved with repetition. Standing in tandem in parallel bars. Once stable, he performed forward and back weight shifts and then rotation. He was very unsteady initially, improved with active stabilization through his hips. Attempted standing on upside down BOSU, patient was too unstable. Moved to rocker board, ant/post and then laterally. Very shaky initially, with poor ankle control, but he did improve with repetition.  02/17/22   Recumbent bike 4.0 x 6 minutes.   Bridge with hip abd  against Red Tband 2 x 10   SLR, SLR with ER, 1 x 10 reps with 2# resistance.   Supine march x 10   Standing sh ext, rows, ER 10 reps each G Tband Standing 3 way kick with UE support, G Tband 10 reps    Tx. 02/15/2022   NuStep L5 x6 min  Sit to stands UE assist elevated mat 2x10  4in step HHA x1 2x10 Rows green 2x15 Shoulder Extensions green 2x15 Leg curls 35lb 2x10   Leg Ext 10lb 2x10   Bridges x10 x5    SLR 2x10 each    Hooklying ball squeeze x15   PATIENT EDUCATION: Education details: HEP, rest between exercises Person educated: patient Education method: Explanation, demo, handout Education comprehension: verbalized understanding   HOME EXERCISE PROGRAM: Access Code: [email or web address] URL: [email or web address] Date: 02/17/2022     GOALS: Goals reviewed with patient? Yes  SHORT TERM GOALS: Target date: 03/10/2022  I with initial HEP Baseline: Initiated  Goal status: ongoing   2.  FGA improved to 16/30 Baseline: 17 Goal status: met   LONG TERM GOALS: Target date: 04/21/2022  I with final HEP Baseline:  Goal status: INITIAL  2.  Paitent will complete 5x STS in < 12 seconds to demosntrate improved balance and LE strength. Baseline: 16.9 with unsteadiness each time he rises. Goal status: INITIAL  3.  Patient will ambulate x at least 1000' on level and unlevel surfaces I, no loss of balance or unsteadiness noted. Baseline: Ataxic, unsteady, 100' Goal status: INITIAL  4.  Complete TUG in < 10 sec to demosntrate improved balance Baseline: 14.79 Goal status: INITIAL  5.  Patient will score at least 21/27 Baseline: 13/27-Patient cannot perform heel toe gait. Goal status: INITIAL    ASSESSMENT:  CLINICAL IMPRESSION: Patient reports no issues. Re-assessed FGA, he improved to 17/30. Treatment continued to emphasize balance, speed of movement, stability.  OBJECTIVE IMPAIRMENTS Abnormal gait, decreased balance, decreased coordination, decreased  endurance, decreased mobility, difficulty walking, decreased ROM, decreased strength, impaired flexibility, improper body mechanics, and postural dysfunction.   ACTIVITY LIMITATIONS carrying, lifting, squatting, stairs, transfers, reach over head, and locomotion level  PARTICIPATION LIMITATIONS: cleaning, laundry, shopping, and yard work  [place] Past/current experiences and 1 comorbidity: L meningioma  are also affecting patient's functional outcome.   REHAB POTENTIAL: Good  CLINICAL DECISION MAKING: Evolving/moderate complexity  EVALUATION COMPLEXITY: Moderate  PLAN: PT FREQUENCY: 2x/week  PT DURATION: 10 weeks  PLANNED INTERVENTIONS: Therapeutic exercises, Therapeutic activity, Neuromuscular re-education, Balance training, Gait training, Patient/Family education, Joint mobilization, Stair training, and Manual therapy  PLAN FOR NEXT SESSION:  strengthening, balance training   Marcelina Morel, DPT 03/11/2022, 11:47 AM

## 2022-03-16 ENCOUNTER — Ambulatory Visit: Payer: Medicare Other | Attending: Physician Assistant | Admitting: Physical Therapy

## 2022-03-16 ENCOUNTER — Ambulatory Visit: Payer: Medicare Other

## 2022-03-16 ENCOUNTER — Encounter: Payer: Self-pay | Admitting: Physical Therapy

## 2022-03-16 DIAGNOSIS — R2681 Unsteadiness on feet: Secondary | ICD-10-CM | POA: Insufficient documentation

## 2022-03-16 DIAGNOSIS — R29898 Other symptoms and signs involving the musculoskeletal system: Secondary | ICD-10-CM | POA: Insufficient documentation

## 2022-03-16 DIAGNOSIS — R001 Bradycardia, unspecified: Secondary | ICD-10-CM

## 2022-03-16 DIAGNOSIS — R531 Weakness: Secondary | ICD-10-CM

## 2022-03-16 DIAGNOSIS — I255 Ischemic cardiomyopathy: Secondary | ICD-10-CM

## 2022-03-16 DIAGNOSIS — Y92009 Unspecified place in unspecified non-institutional (private) residence as the place of occurrence of the external cause: Secondary | ICD-10-CM | POA: Diagnosis not present

## 2022-03-16 DIAGNOSIS — M6281 Muscle weakness (generalized): Secondary | ICD-10-CM | POA: Insufficient documentation

## 2022-03-16 DIAGNOSIS — W19XXXA Unspecified fall, initial encounter: Secondary | ICD-10-CM | POA: Diagnosis not present

## 2022-03-16 DIAGNOSIS — D329 Benign neoplasm of meninges, unspecified: Secondary | ICD-10-CM | POA: Insufficient documentation

## 2022-03-16 DIAGNOSIS — R278 Other lack of coordination: Secondary | ICD-10-CM | POA: Diagnosis not present

## 2022-03-16 DIAGNOSIS — R2689 Other abnormalities of gait and mobility: Secondary | ICD-10-CM | POA: Diagnosis not present

## 2022-03-16 NOTE — Therapy (Signed)
OUTPATIENT PHYSICAL THERAPY NEURO EVALUATION   Patient Name: Timothy Moran MRN: [id number] DOB:Mar 21, 1946, 76 y.o., male Today's Date: 03/16/2022   PCP: Cari Caraway REFERRING PROVIDER: Barbie Banner, PA-C            Past Medical History:  Diagnosis Date   Acute blood loss anemia 10/05/2020   Acute respiratory failure ([place]), hypothermia therapy, vent - extubated 10/06/20    AKI (acute kidney injury) ([place]) 10/05/2020   Anoxic brain injury ([place]) 10/26/2020   Arrhythmia    Ascending aortic aneurysm ([place]) 01/29/2018   a. 2019 4.3 cm by echo; b. 02/2021 Echo: Ao root 1mm.   Benign brain tumor ([place])    Benign neoplasm of brain ([place]) 12/19/2017   Cardiac arrest with ventricular fibrillation ([place])    Chest pain 12/04/2017   Chronic kidney disease, stage III (moderate) ([place]) 12/19/2017   CKD (chronic kidney disease), stage III - IV (HCC)    Coronary Artery Disease    a. 04/5276 Inf STEMI complicated by cardiac arrest/PCI: LAD 50p/m, LCX 100d (2.5 x 30 Resolute Onyx DES); b. 04/2021 PCI: 04/2021 LM nl, LAD 50p/39m (2.5x30 Onyx Frontier DES), LCX 25p, 83m (RFR 0.98), 40d ISR (RFR 0.98), OM2 25, RCA mild diff dzs.   Diabetes mellitus, type 2 ([place]) 10/06/2020   10/06/20 A1C 7%   Dizziness 10/11/2019   Encounter for imaging study to confirm orogastric (OG) tube placement    Hematuria 10/26/2020   HFrEF (heart failure with reduced ejection fraction) ([place])    a. 11/2020 Echo: EF 25-30%; b. 02/2021 Echo: EF 25-30%, glob HK. Mild LVH. Nl RV fxn. Triv AI. Ao root 10mm.   History of pulmonary embolus (PE)    HLD (hyperlipidemia) 10/26/2020   Hypertension    Iron deficiency anemia rec'd IV iron 10/26/2020   Ischemic cardiomyopathy    a. 11/2020 Echo: EF 25-30%; b. 02/2021 Echo: EF 25-30%, glob HK.   Left bundle branch block 12/19/2017   Meningioma ([place]) 08/14/2020   Nonsustained ventricular tachycardia ([place]) 10/11/2019   Pain of left hip joint 05/29/2020   Personal history of  pulmonary embolism 12/19/2017   Pneumonia of both lungs due to infectious organism    Pulmonary hypertension due to thromboembolism ([place]) 03/09/2018   See echo 12/27/17 with nl PAS vs CTa chest 02/23/18    S/P angioplasty with stent 10/04/20 DES to LCX  10/26/2020   Sinus bradycardia 12/22/2017   SOB (shortness of breath)    Solitary pulmonary nodule on lung CT 03/08/2018   CT 12/04/17 1.0 x 0.8 x 0.7 cm nodular opacity in the RUL vs not seen 03/16/10 posterior segment of the right upper lobe. [place] KitchenSpirometry 03/08/2018    FEV1 3.86 (108%)  Ratio 96 s prior rx  - PET  03/13/18   Low grade c/w adenoca > rec T surgery eval     STEMI (ST elevation myocardial infarction) ([place]) 10/04/2020   Urinary retention 10/26/2020   Vestibular schwannoma ([place]) 08/14/2020   Past Surgical History:  Procedure Laterality Date   APPENDECTOMY     BIV ICD INSERTION CRT-D N/A 11/03/2021   Procedure: BIV ICD INSERTION CRT-D;  Surgeon: Constance Haw, MD;  Location: [facility];  Service: Cardiovascular;  Laterality: N/A;   CARDIAC CATHETERIZATION     CORONARY ANGIOPLASTY WITH STENT PLACEMENT Left 04/12/2021   LAD stent placement   CORONARY STENT INTERVENTION N/A 04/12/2021   Procedure: CORONARY STENT INTERVENTION;  Surgeon: Nelva Bush, MD;  Location: [facility];  Service: Cardiovascular;  Laterality: N/A;   CORONARY/GRAFT ACUTE MI REVASCULARIZATION N/A 10/04/2020   Procedure: Coronary/Graft Acute MI Revascularization;  Surgeon: Nelva Bush, MD;  Location: [facility];  Service: Cardiovascular;  Laterality: N/A;   HERNIA REPAIR     INTRAVASCULAR PRESSURE WIRE/FFR STUDY N/A 04/12/2021   Procedure: INTRAVASCULAR PRESSURE WIRE/FFR STUDY;  Surgeon: Nelva Bush, MD;  Location: [facility];  Service: Cardiovascular;  Laterality: N/A;   INTRAVASCULAR ULTRASOUND/IVUS N/A 04/12/2021   Procedure: Intravascular Ultrasound/IVUS;  Surgeon: Nelva Bush, MD;  Location: [facility];   Service: Cardiovascular;  Laterality: N/A;   LEFT HEART CATH AND CORONARY ANGIOGRAPHY N/A 10/04/2020   Procedure: LEFT HEART CATH AND CORONARY ANGIOGRAPHY;  Surgeon: Nelva Bush, MD;  Location: [facility];  Service: Cardiovascular;  Laterality: N/A;   RIGHT/LEFT HEART CATH AND CORONARY ANGIOGRAPHY N/A 04/12/2021   Procedure: RIGHT/LEFT HEART CATH AND CORONARY ANGIOGRAPHY;  Surgeon: Nelva Bush, MD;  Location: [facility];  Service: Cardiovascular;  Laterality: N/A;   Patient Active Problem List   Diagnosis Date Noted   Anemia    Leukocytosis    Right arm weakness 01/25/2022   Right sided weakness    Fall at home 01/22/2022   Status post biventricular pacemaker - MRI compatible PPM and leads 01/22/2022   Chronic indwelling Foley catheter 01/22/2022   CKD (chronic kidney disease), stage III - IV ([employer]) 06/28/2021   Chronic HFrEF (heart failure with reduced ejection fraction) ([place]) 03/31/2021   Arrhythmia    DM (diabetes mellitus), type 2 ([place]) new 10/26/2020   Urinary retention 10/26/2020   Hyperlipidemia LDL goal <70 10/26/2020   Coronary artery disease of native artery of native heart with stable angina pectoris ([place]) 10/26/2020   S/P angioplasty with stent 10/04/20 DES to LCX  10/26/2020   Ischemic cardiomyopathy 10/26/2020   Diabetes mellitus, type 2 ([place]) 10/06/2020   DOE (dyspnea on exertion)    Meningioma ([place]) 08/14/2020   Vestibular schwannoma ([place]) 08/14/2020   Cardiac arrest with ventricular fibrillation (HCC)    Pain of left hip joint 05/29/2020   Benign brain tumor ([place])    Chronic kidney disease    History of pulmonary embolus (PE)    Hypertension    Nonsustained ventricular tachycardia ([place]) 10/11/2019   Dizziness 10/11/2019   Abnormal echocardiogram 06/07/2019   Pulmonary hypertension due to thromboembolism ([place]) 03/09/2018   Solitary pulmonary nodule on lung CT 03/08/2018   Ascending aortic aneurysm (HCC) 4.2 cm based on CT from December 2020  01/29/2018   Sinus bradycardia 12/22/2017   Left bundle branch block 12/19/2017   Personal history of pulmonary embolism - no longer on anticoagulation due to pt refusal to take it anymore. 12/19/2017   Chronic kidney disease, stage III (moderate) ([place]) 12/19/2017   Benign neoplasm of brain ([place]) 12/19/2017    ONSET DATE: 02/01/2022   REFERRING DIAG: Diagnosis R53.1 (ICD-10-CM) - Right sided weakness D32.9 (ICD-10-CM) - Meningioma (HCC)   THERAPY DIAG:  No diagnosis found.  Rationale for Evaluation and Treatment Rehabilitation  SUBJECTIVE:  SUBJECTIVE STATEMENT: Patient reports no issues, feels about the same.  PERTINENT HISTORY: Brief HPI:   Lavante Toso is a 76 y.o. male who fell backward striking his head and right neck shoulder area on 01/21/2022. He complained of right hemiparesis. CT head revealed left parafalcine meningioma.Started on Decadron with improvement in symptoms   PAIN:  Are you having pain? No  PRECAUTIONS: None  WEIGHT BEARING RESTRICTIONS No  FALLS: Has patient fallen in last 6 months? Yes. Number of falls 1  LIVING ENVIRONMENT: Lives with: lives with their spouse Lives in: House/apartment Stairs: Yes: External: 7 steps; bilateral but cannot reach both Has following equipment at home: Walker - 2 wheeled  PLOF: [place] Patient would like to return to normal. Able to participate in all activities, he was not limited before.  OBJECTIVE:  MUSCLE LENGTH: Hamstrings: Right 72 deg; Left 78 deg Eval   POSTURE: rounded shoulders  LOWER EXTREMITY ROM:   PROM WFL in LE   LOWER EXTREMITY MMT:    MMT Right Eval Left Eval  Hip flexion 4 4  Hip extension 4- 4  Hip abduction 4- 4  Hip adduction    Hip internal rotation    Hip external  rotation    Knee flexion 4 4  Knee extension 4 4  Ankle dorsiflexion 4 4  Ankle plantarflexion    Ankle inversion    Ankle eversion    (Blank rows = not tested)   FUNCTIONAL TESTs:  EVAL 5 times sit to stand: 16.97 Timed up and go (TUG): 14.79 Functional gait assessment: 13/30  TODAY'S TREATMENT:  03/16/22 Nu-Step L5 x 6 minutes Wall bumps, moving progressively further from the wall, improved hip strategies for balance, but still had some unsteadiness. Alternating step taps on 6" step from Airex pad. Progressed to 2 taps each time, CGA. Patient shaky, but minimal LOB noted. Floor ladder- Patient walked in multiple patterns involving changes of direction and weight shift, CGA, LOB x 2 required min A to recover. Repeated while holding 4# ball in BUE, minimal improvement noted. Walking with 1 1/2# weights on each limb. 1 x 150', improved LE control noted, with decreased ataxic movements. Practiced forward/back, side to side and alternate tapping on a step while wearing 1 1/2# weights for improved LE control, CGA mild unsteadiness.    03/11/22 Recumbent bike 4.1 x 4 minutes, then 3 x 30 sec fast, > 80 RPM FGA re-assess 17/20 TG met Balance training on Air ex ant/post weight shifts, then marching, UUE support, held standing knee in slight flexion to increase stability. Sitting on physiodisc to mobilize lower trunk. Ant/Post, Lat, and then pelvic clocks in each direction, 10 reps each Standing rows with 20#, SH ext with 15#, sh ER with 10# resistance at power tower. 2 x 10 reps  03/09/22 Nu-Step L5 x 6 minutes. Balance training on floor mat-B side stepping, Alternate taps on 2" cushion on top of mat, progressed to 3 taps with each foot, 10 reps each- mild unsteadiness, patient mostly able to recover I. Wall bumps to facilitate improved control with backward weight shifts. As his feet moved progressively further from the wall, he struggled to control his posterior weight shift. Standing on  floor mat, performed weight shifts forward and back, continued to have difficulty with control of weight shifts, occasional min A for balance. Toe walking, heel walking, 3 x 15' each, CGA. Walked while holding a 4# ball out in front with BUE to increase his stability and control.  He walked with much less ataxia and improved stability.   03/03/22 Nu-Step L5 x 6 minutes Step taps on 6" step while holding 4# weight in BUE, forward, side taps x 10 each, Attempted tapping with rotation. Patient was able to tap out to the side and then rotate to bring foot closer to midline, unable to reach midline without LOB.  Sat on Physiodisc for lower trunk mobilization and stabilization, ant/post, lat, clocks x 5 each way. Resisted gait with 30#, 5 reps each direction.  03/01/22 Nu-Step L5 x 6 minutes Lateral push on treadmill, 2 x 10 in each direction, BUE support Alternating step ups onto 6" step with B rails. Mod balance difficulty, required minA x 4 due to LOB and he recovered by himself at other times. 10 reps each leg. SLS with opposite foot on 6" step. Mini squats and rotation around stance leg without UE support, 5 of each, performed with each leg and attempted no UE support, required occasional grab of handrail and occasional min A. Ambulated while holsing 4# weight in BUE, with much more stable gait, less balance loss, 150', with multiple turns, CGA. Returned to steps and alternate taps, this time holding 4# weight in BUE. He completed 10 alternate taps and then 10 x 3 taps with each foot on 6" step without UE support, much more stable.    PATIENT EDUCATION: Education details: HEP, rest between exercises Person educated: patient Education method: Explanation, demo, handout Education comprehension: verbalized understanding   HOME EXERCISE PROGRAM: Access Code: [email or web address] URL: [email or web address] Date: 02/17/2022     GOALS: Goals reviewed with patient? Yes  SHORT TERM GOALS:  Target date: 03/10/2022  I with initial HEP Baseline: Initiated Goal status: ongoing   2.  FGA improved to 16/30 Baseline: 17 Goal status: met   LONG TERM GOALS: Target date: 04/21/2022  I with final HEP Baseline:  Goal status: INITIAL  2.  Paitent will complete 5x STS in < 12 seconds to demosntrate improved balance and LE strength. Baseline: 16.9 with unsteadiness each time he rises. Goal status: INITIAL  3.  Patient will ambulate x at least 1000' on level and unlevel surfaces I, no loss of balance or unsteadiness noted. Baseline: Ataxic, unsteady, 100' Goal status: INITIAL  4.  Complete TUG in < 10 sec to demosntrate improved balance Baseline: 14.79 Goal status: INITIAL  5.  Patient will score at least 21/27 Baseline: 13/27-Patient cannot perform heel toe gait. Goal status: INITIAL    ASSESSMENT:  CLINICAL IMPRESSION: Patient reports no issues. Continued POC to address ataxic movements and facilitate improved control and balance.  OBJECTIVE IMPAIRMENTS Abnormal gait, decreased balance, decreased coordination, decreased endurance, decreased mobility, difficulty walking, decreased ROM, decreased strength, impaired flexibility, improper body mechanics, and postural dysfunction.   ACTIVITY LIMITATIONS carrying, lifting, squatting, stairs, transfers, reach over head, and locomotion level  PARTICIPATION LIMITATIONS: cleaning, laundry, shopping, and yard work  [place] Past/current experiences and 1 comorbidity: L meningioma  are also affecting patient's functional outcome.   REHAB POTENTIAL: Good  CLINICAL DECISION MAKING: Evolving/moderate complexity  EVALUATION COMPLEXITY: Moderate  PLAN: PT FREQUENCY: 2x/week  PT DURATION: 10 weeks  PLANNED INTERVENTIONS: Therapeutic exercises, Therapeutic activity, Neuromuscular re-education, Balance training, Gait training, Patient/Family education, Joint mobilization, Stair training, and Manual therapy  PLAN FOR  NEXT SESSION:  Functional re-assessment for 10th visit.   Marcelina Morel, DPT 03/16/2022, 9:31 AM

## 2022-03-18 ENCOUNTER — Ambulatory Visit: Payer: Medicare Other | Admitting: Physical Therapy

## 2022-03-18 DIAGNOSIS — M6281 Muscle weakness (generalized): Secondary | ICD-10-CM | POA: Diagnosis not present

## 2022-03-18 DIAGNOSIS — D329 Benign neoplasm of meninges, unspecified: Secondary | ICD-10-CM

## 2022-03-18 DIAGNOSIS — R531 Weakness: Secondary | ICD-10-CM

## 2022-03-18 DIAGNOSIS — R29898 Other symptoms and signs involving the musculoskeletal system: Secondary | ICD-10-CM | POA: Diagnosis not present

## 2022-03-18 DIAGNOSIS — R2681 Unsteadiness on feet: Secondary | ICD-10-CM

## 2022-03-18 DIAGNOSIS — R278 Other lack of coordination: Secondary | ICD-10-CM | POA: Diagnosis not present

## 2022-03-18 DIAGNOSIS — Y92009 Unspecified place in unspecified non-institutional (private) residence as the place of occurrence of the external cause: Secondary | ICD-10-CM

## 2022-03-18 DIAGNOSIS — R2689 Other abnormalities of gait and mobility: Secondary | ICD-10-CM

## 2022-03-18 NOTE — Therapy (Signed)
OUTPATIENT PHYSICAL THERAPY NEURO EVALUATION   Patient Name: Timothy Moran MRN: [id number] DOB:04-Jun-1946, 76 y.o., male Today's Date: 03/18/2022   PCP: Cari Caraway REFERRING PROVIDER: Barbie Banner, PA-C    PT End of Session - 03/18/22 1146     Visit Number 10    Date for PT Re-Evaluation 04/21/22    PT Start Time 1100    PT Stop Time 1141    PT Time Calculation (min) 41 min    Equipment Utilized During Treatment Gait belt    Activity Tolerance Patient tolerated treatment well    Behavior During Therapy [facility] for tasks assessed/performed                    Past Medical History:  Diagnosis Date   Acute blood loss anemia 10/05/2020   Acute respiratory failure ([place]), hypothermia therapy, vent - extubated 10/06/20    AKI (acute kidney injury) ([place]) 10/05/2020   Anoxic brain injury ([place]) 10/26/2020   Arrhythmia    Ascending aortic aneurysm ([place]) 01/29/2018   a. 2019 4.3 cm by echo; b. 02/2021 Echo: Ao root 59mm.   Benign brain tumor ([place])    Benign neoplasm of brain ([place]) 12/19/2017   Cardiac arrest with ventricular fibrillation ([place])    Chest pain 12/04/2017   Chronic kidney disease, stage III (moderate) ([place]) 12/19/2017   CKD (chronic kidney disease), stage III - IV (HCC)    Coronary Artery Disease    a. 0/3500 Inf STEMI complicated by cardiac arrest/PCI: LAD 50p/m, LCX 100d (2.5 x 30 Resolute Onyx DES); b. 04/2021 PCI: 04/2021 LM nl, LAD 50p/76m (2.5x30 Onyx Frontier DES), LCX 25p, 91m (RFR 0.98), 40d ISR (RFR 0.98), OM2 25, RCA mild diff dzs.   Diabetes mellitus, type 2 ([place]) 10/06/2020   10/06/20 A1C 7%   Dizziness 10/11/2019   Encounter for imaging study to confirm orogastric (OG) tube placement    Hematuria 10/26/2020   HFrEF (heart failure with reduced ejection fraction) ([place])    a. 11/2020 Echo: EF 25-30%; b. 02/2021 Echo: EF 25-30%, glob HK. Mild LVH. Nl RV fxn. Triv AI. Ao root 64mm.   History of pulmonary embolus (PE)    HLD (hyperlipidemia)  10/26/2020   Hypertension    Iron deficiency anemia rec'd IV iron 10/26/2020   Ischemic cardiomyopathy    a. 11/2020 Echo: EF 25-30%; b. 02/2021 Echo: EF 25-30%, glob HK.   Left bundle branch block 12/19/2017   Meningioma ([place]) 08/14/2020   Nonsustained ventricular tachycardia ([place]) 10/11/2019   Pain of left hip joint 05/29/2020   Personal history of pulmonary embolism 12/19/2017   Pneumonia of both lungs due to infectious organism    Pulmonary hypertension due to thromboembolism ([place]) 03/09/2018   See echo 12/27/17 with nl PAS vs CTa chest 02/23/18    S/P angioplasty with stent 10/04/20 DES to LCX  10/26/2020   Sinus bradycardia 12/22/2017   SOB (shortness of breath)    Solitary pulmonary nodule on lung CT 03/08/2018   CT 12/04/17 1.0 x 0.8 x 0.7 cm nodular opacity in the RUL vs not seen 03/16/10 posterior segment of the right upper lobe. Marland KitchenSpirometry 03/08/2018    FEV1 3.86 (108%)  Ratio 96 s prior rx  - PET  03/13/18   Low grade c/w adenoca > rec T surgery eval     STEMI (ST elevation myocardial infarction) ([place]) 10/04/2020   Urinary retention 10/26/2020   Vestibular schwannoma ([place]) 08/14/2020   Past Surgical History:  Procedure Laterality Date  APPENDECTOMY     BIV ICD INSERTION CRT-D N/A 11/03/2021   Procedure: BIV ICD INSERTION CRT-D;  Surgeon: Constance Haw, MD;  Location: [place] [facility];  Service: Cardiovascular;  Laterality: N/A;   CARDIAC CATHETERIZATION     CORONARY ANGIOPLASTY WITH STENT PLACEMENT Left 04/12/2021   LAD stent placement   CORONARY STENT INTERVENTION N/A 04/12/2021   Procedure: CORONARY STENT INTERVENTION;  Surgeon: Nelva Bush, MD;  Location: [place] [facility];  Service: Cardiovascular;  Laterality: N/A;   CORONARY/GRAFT ACUTE MI REVASCULARIZATION N/A 10/04/2020   Procedure: Coronary/Graft Acute MI Revascularization;  Surgeon: Nelva Bush, MD;  Location: [place] [facility];  Service: Cardiovascular;  Laterality: N/A;   HERNIA REPAIR      INTRAVASCULAR PRESSURE WIRE/FFR STUDY N/A 04/12/2021   Procedure: INTRAVASCULAR PRESSURE WIRE/FFR STUDY;  Surgeon: Nelva Bush, MD;  Location: [place] [facility];  Service: Cardiovascular;  Laterality: N/A;   INTRAVASCULAR ULTRASOUND/IVUS N/A 04/12/2021   Procedure: Intravascular Ultrasound/IVUS;  Surgeon: Nelva Bush, MD;  Location: [place] [facility];  Service: Cardiovascular;  Laterality: N/A;   LEFT HEART CATH AND CORONARY ANGIOGRAPHY N/A 10/04/2020   Procedure: LEFT HEART CATH AND CORONARY ANGIOGRAPHY;  Surgeon: Nelva Bush, MD;  Location: [place] [facility];  Service: Cardiovascular;  Laterality: N/A;   RIGHT/LEFT HEART CATH AND CORONARY ANGIOGRAPHY N/A 04/12/2021   Procedure: RIGHT/LEFT HEART CATH AND CORONARY ANGIOGRAPHY;  Surgeon: Nelva Bush, MD;  Location: [place] [facility];  Service: Cardiovascular;  Laterality: N/A;   Patient Active Problem List   Diagnosis Date Noted   Anemia    Leukocytosis    Right arm weakness 01/25/2022   Right sided weakness    Fall at home 01/22/2022   Status post biventricular pacemaker - MRI compatible PPM and leads 01/22/2022   Chronic indwelling Foley catheter 01/22/2022   CKD (chronic kidney disease), stage III - IV ([place]) 06/28/2021   Chronic HFrEF (heart failure with reduced ejection fraction) ([place]) 03/31/2021   Arrhythmia    DM (diabetes mellitus), type 2 ([place]) new 10/26/2020   Urinary retention 10/26/2020   Hyperlipidemia LDL goal <70 10/26/2020   Coronary artery disease of native artery of native heart with stable angina pectoris ([place]) 10/26/2020   S/P angioplasty with stent 10/04/20 DES to LCX  10/26/2020   Ischemic cardiomyopathy 10/26/2020   Diabetes mellitus, type 2 ([place]) 10/06/2020   DOE (dyspnea on exertion)    Meningioma ([place]) 08/14/2020   Vestibular schwannoma ([place]) 08/14/2020   Cardiac arrest with ventricular fibrillation (HCC)    Pain of left hip joint 05/29/2020   Benign brain tumor ([place])    Chronic kidney  disease    History of pulmonary embolus (PE)    Hypertension    Nonsustained ventricular tachycardia ([place]) 10/11/2019   Dizziness 10/11/2019   Abnormal echocardiogram 06/07/2019   Pulmonary hypertension due to thromboembolism ([place]) 03/09/2018   Solitary pulmonary nodule on lung CT 03/08/2018   Ascending aortic aneurysm (HCC) 4.2 cm based on CT from December 2020 01/29/2018   Sinus bradycardia 12/22/2017   Left bundle branch block 12/19/2017   Personal history of pulmonary embolism - no longer on anticoagulation due to pt refusal to take it anymore. 12/19/2017   Chronic kidney disease, stage III (moderate) ([place]) 12/19/2017   Benign neoplasm of brain ([place]) 12/19/2017    ONSET DATE: 02/01/2022   REFERRING DIAG: Diagnosis R53.1 (ICD-10-CM) - Right sided weakness D32.9 (ICD-10-CM) - Meningioma (HCC)   THERAPY DIAG:  Other abnormalities of gait and mobility  Other lack of coordination  Meningioma ([place])  Right sided weakness  Unsteadiness on feet  Muscle weakness (generalized)  Fall in home, initial encounter  Rationale for Evaluation and Treatment Rehabilitation  SUBJECTIVE:                                                                                                                                                                                              SUBJECTIVE STATEMENT: Patient reports no issues, feels about the same.  PERTINENT HISTORY: Brief HPI:   Ezechiel Stooksbury is a 76 y.o. male who fell backward striking his head and right neck shoulder area on 01/21/2022. He complained of right hemiparesis. CT head revealed left parafalcine meningioma.Started on Decadron with improvement in symptoms   PAIN:  Are you having pain? No  PRECAUTIONS: None  WEIGHT BEARING RESTRICTIONS No  FALLS: Has patient fallen in last 6 months? Yes. Number of falls 1  LIVING ENVIRONMENT: Lives with: lives with their spouse Lives in: House/apartment Stairs: Yes: External: 7 steps;  bilateral but cannot reach both Has following equipment at home: Walker - 2 wheeled  PLOF: [place] Patient would like to return to normal. Able to participate in all activities, he was not limited before.  OBJECTIVE:  MUSCLE LENGTH: Hamstrings: Right 72 deg; Left 78 deg Eval   POSTURE: rounded shoulders  LOWER EXTREMITY ROM:   PROM WFL in LE   LOWER EXTREMITY MMT:    MMT Right Eval Left Eval  Hip flexion 4 4  Hip extension 4- 4  Hip abduction 4- 4  Hip adduction    Hip internal rotation    Hip external rotation    Knee flexion 4 4  Knee extension 4 4  Ankle dorsiflexion 4 4  Ankle plantarflexion    Ankle inversion    Ankle eversion    (Blank rows = not tested)   FUNCTIONAL TESTs:  EVAL 5 times sit to stand: 16.97 Timed up and go (TUG): 14.79 Functional gait assessment: 13/30  TODAY'S TREATMENT:  03/18/22 Functinal status re-assessed for PN. See goals for status. Ambulated x 800' with 2 episodes of LOB. Updated HEP with balance activities, patient performed each-side to side step, for/back step-both quick, romberg stance. 03/16/22 Nu-Step L5 x 6 minutes Wall bumps, moving progressively further from the wall, improved hip strategies for balance, but still had some unsteadiness. Alternating step taps on 6" step from Airex pad. Progressed to 2 taps each time, CGA. Patient shaky, but minimal LOB noted. Floor ladder- Patient walked in multiple patterns involving changes of direction and weight shift, CGA, LOB x 2 required min A  to recover. Repeated while holding 4# ball in BUE, minimal improvement noted. Walking with 1 1/2# weights on each limb. 1 x 150', improved LE control noted, with decreased ataxic movements. Practiced forward/back, side to side and alternate tapping on a step while wearing 1 1/2# weights for improved LE control, CGA mild unsteadiness.    03/11/22 Recumbent bike 4.1 x 4 minutes, then 3 x 30 sec fast, > 80 RPM FGA re-assess 17/20  TG met Balance training on Air ex ant/post weight shifts, then marching, UUE support, held standing knee in slight flexion to increase stability. Sitting on physiodisc to mobilize lower trunk. Ant/Post, Lat, and then pelvic clocks in each direction, 10 reps each Standing rows with 20#, SH ext with 15#, sh ER with 10# resistance at power tower. 2 x 10 reps  03/09/22 Nu-Step L5 x 6 minutes. Balance training on floor mat-B side stepping, Alternate taps on 2" cushion on top of mat, progressed to 3 taps with each foot, 10 reps each- mild unsteadiness, patient mostly able to recover I. Wall bumps to facilitate improved control with backward weight shifts. As his feet moved progressively further from the wall, he struggled to control his posterior weight shift. Standing on floor mat, performed weight shifts forward and back, continued to have difficulty with control of weight shifts, occasional min A for balance. Toe walking, heel walking, 3 x 15' each, CGA. Walked while holding a 4# ball out in front with BUE to increase his stability and control. He walked with much less ataxia and improved stability.   03/03/22 Nu-Step L5 x 6 minutes Step taps on 6" step while holding 4# weight in BUE, forward, side taps x 10 each, Attempted tapping with rotation. Patient was able to tap out to the side and then rotate to bring foot closer to midline, unable to reach midline without LOB.  Sat on Physiodisc for lower trunk mobilization and stabilization, ant/post, lat, clocks x 5 each way. Resisted gait with 30#, 5 reps each direction.  03/01/22 Nu-Step L5 x 6 minutes Lateral push on treadmill, 2 x 10 in each direction, BUE support Alternating step ups onto 6" step with B rails. Mod balance difficulty, required minA x 4 due to LOB and he recovered by himself at other times. 10 reps each leg. SLS with opposite foot on 6" step. Mini squats and rotation around stance leg without UE support, 5 of each, performed with  each leg and attempted no UE support, required occasional grab of handrail and occasional min A. Ambulated while holsing 4# weight in BUE, with much more stable gait, less balance loss, 150', with multiple turns, CGA. Returned to steps and alternate taps, this time holding 4# weight in BUE. He completed 10 alternate taps and then 10 x 3 taps with each foot on 6" step without UE support, much more stable.    PATIENT EDUCATION: Education details: HEP, rest between exercises Person educated: patient Education method: Explanation, demo, handout Education comprehension: verbalized understanding   HOME EXERCISE PROGRAM: Access Code: [email or web address] URL: [email or web address] Date: 02/17/2022     GOALS: Goals reviewed with patient? Yes  SHORT TERM GOALS: Target date: 03/10/2022  I with initial HEP Baseline: Initiated Goal status: met   2.  FGA improved to 16/30 Baseline: 17 Goal status: met   LONG TERM GOALS: Target date: 04/21/2022  I with final HEP Baseline:  Goal status: ongoing  2.  Paitent will complete 5x STS in < 12 seconds to demosntrate improved balance and LE  strength. Baseline: 6/1,16.9 with unsteadiness each time he rises. 7/7-12.01, uses hands, improved stability, but still mildly unsteady on decent. Goal status: ongoing  3.  Patient will ambulate x at least 1000' on level and unlevel surfaces I, no loss of balance or unsteadiness noted. Baseline: Ataxic, unsteady, 800', unlevel surfaces, 2 LOB Goal status: ongoing  4.  Complete TUG in < 10 sec to demosntrate improved balance Baseline: 6/1-14.79, 7/7-10.04 Goal status: ongoing  5.  Patient will score at least 21/27 Baseline: 13/27-Patient cannot perform heel toe gait. 6/29-17/27 Goal status: ongoing   ASSESSMENT:  CLINICAL IMPRESSION: Patient reports no issues. He demonstrates progress toward LTG.Continued POC to address ataxic movements and facilitate improved control and balance.  OBJECTIVE  IMPAIRMENTS Abnormal gait, decreased balance, decreased coordination, decreased endurance, decreased mobility, difficulty walking, decreased ROM, decreased strength, impaired flexibility, improper body mechanics, and postural dysfunction.   ACTIVITY LIMITATIONS carrying, lifting, squatting, stairs, transfers, reach over head, and locomotion level  PARTICIPATION LIMITATIONS: cleaning, laundry, shopping, and yard work  [place] Past/current experiences and 1 comorbidity: L meningioma  are also affecting patient's functional outcome.   REHAB POTENTIAL: Good  CLINICAL DECISION MAKING: Evolving/moderate complexity  EVALUATION COMPLEXITY: Moderate  PLAN: PT FREQUENCY: 2x/week  PT DURATION: 10 weeks  PLANNED INTERVENTIONS: Therapeutic exercises, Therapeutic activity, Neuromuscular re-education, Balance training, Gait training, Patient/Family education, Joint mobilization, Stair training, and Manual therapy  PLAN FOR NEXT SESSION:  Functional re-assessment for 10th visit.   Marcelina Morel, DPT 03/18/2022, 11:49 AM

## 2022-03-21 DIAGNOSIS — N401 Enlarged prostate with lower urinary tract symptoms: Secondary | ICD-10-CM | POA: Diagnosis not present

## 2022-03-21 DIAGNOSIS — R338 Other retention of urine: Secondary | ICD-10-CM | POA: Diagnosis not present

## 2022-03-24 LAB — CUP PACEART INCLINIC DEVICE CHECK
Date Time Interrogation Session: 20230705160610
Implantable Lead Implant Date: 20230222
Implantable Lead Implant Date: 20230222
Implantable Lead Implant Date: 20230222
Implantable Lead Location: 753858
Implantable Lead Location: 753859
Implantable Lead Location: 753860
Implantable Lead Model: 4798
Implantable Lead Model: 5076
Implantable Pulse Generator Implant Date: 20230222

## 2022-03-24 NOTE — Progress Notes (Signed)
In clinic check per WC to reassess Bi-Vp, Bi-Vp 83.5%, patient having significant amount of conducted PACs.  Per paceart note on 02/28/2022  shorten PVARP, PVARP changed from auto and set to 232m.

## 2022-03-28 ENCOUNTER — Ambulatory Visit: Payer: Medicare Other | Admitting: Physical Therapy

## 2022-03-28 ENCOUNTER — Encounter: Payer: Self-pay | Admitting: Physical Therapy

## 2022-03-28 DIAGNOSIS — R2681 Unsteadiness on feet: Secondary | ICD-10-CM

## 2022-03-28 DIAGNOSIS — M6281 Muscle weakness (generalized): Secondary | ICD-10-CM

## 2022-03-28 DIAGNOSIS — R531 Weakness: Secondary | ICD-10-CM

## 2022-03-28 DIAGNOSIS — R29898 Other symptoms and signs involving the musculoskeletal system: Secondary | ICD-10-CM | POA: Diagnosis not present

## 2022-03-28 DIAGNOSIS — R2689 Other abnormalities of gait and mobility: Secondary | ICD-10-CM | POA: Diagnosis not present

## 2022-03-28 DIAGNOSIS — R278 Other lack of coordination: Secondary | ICD-10-CM | POA: Diagnosis not present

## 2022-03-28 DIAGNOSIS — D329 Benign neoplasm of meninges, unspecified: Secondary | ICD-10-CM

## 2022-03-28 NOTE — Therapy (Signed)
OUTPATIENT PHYSICAL THERAPY TREATMENT   Patient Name: Timothy Moran MRN: [id number] DOB:[birth year], 76 y.o., male Today's Date: 03/28/2022   PCP: Cari Caraway REFERRING PROVIDER: Barbie Banner, PA-C             Past Medical History:  Diagnosis Date   Acute blood loss anemia 10/05/2020   Acute respiratory failure ([employer]), hypothermia therapy, vent - extubated 10/06/20    AKI (acute kidney injury) ([place]) 10/05/2020   Anoxic brain injury ([place]) 10/26/2020   Arrhythmia    Ascending aortic aneurysm ([place]) 01/29/2018   a. 2019 4.3 cm by echo; b. 02/2021 Echo: Ao root 18mm.   Benign brain tumor ([place])    Benign neoplasm of brain ([place]) 12/19/2017   Cardiac arrest with ventricular fibrillation ([place])    Chest pain 12/04/2017   Chronic kidney disease, stage III (moderate) ([place]) 12/19/2017   CKD (chronic kidney disease), stage III - IV (HCC)    Coronary Artery Disease    a. 10/9474 Inf STEMI complicated by cardiac arrest/PCI: LAD 50p/m, LCX 100d (2.5 x 30 Resolute Onyx DES); b. 04/2021 PCI: 04/2021 LM nl, LAD 50p/20m (2.5x30 Onyx Frontier DES), LCX 25p, 67m (RFR 0.98), 40d ISR (RFR 0.98), OM2 25, RCA mild diff dzs.   Diabetes mellitus, type 2 ([place]) 10/06/2020   10/06/20 A1C 7%   Dizziness 10/11/2019   Encounter for imaging study to confirm orogastric (OG) tube placement    Hematuria 10/26/2020   HFrEF (heart failure with reduced ejection fraction) ([place])    a. 11/2020 Echo: EF 25-30%; b. 02/2021 Echo: EF 25-30%, glob HK. Mild LVH. Nl RV fxn. Triv AI. Ao root 70mm.   History of pulmonary embolus (PE)    HLD (hyperlipidemia) 10/26/2020   Hypertension    Iron deficiency anemia rec'd IV iron 10/26/2020   Ischemic cardiomyopathy    a. 11/2020 Echo: EF 25-30%; b. 02/2021 Echo: EF 25-30%, glob HK.   Left bundle branch block 12/19/2017   Meningioma ([place]) 08/14/2020   Nonsustained ventricular tachycardia ([place]) 10/11/2019   Pain of left hip joint 05/29/2020   Personal history of pulmonary  embolism 12/19/2017   Pneumonia of both lungs due to infectious organism    Pulmonary hypertension due to thromboembolism ([place]) 03/09/2018   See echo 12/27/17 with nl PAS vs CTa chest 02/23/18    S/P angioplasty with stent 10/04/20 DES to LCX  10/26/2020   Sinus bradycardia 12/22/2017   SOB (shortness of breath)    Solitary pulmonary nodule on lung CT 03/08/2018   CT 12/04/17 1.0 x 0.8 x 0.7 cm nodular opacity in the RUL vs not seen 03/16/10 posterior segment of the right upper lobe. [place] KitchenSpirometry 03/08/2018    FEV1 3.86 (108%)  Ratio 96 s prior rx  - PET  03/13/18   Low grade c/w adenoca > rec T surgery eval     STEMI (ST elevation myocardial infarction) ([place]) 10/04/2020   Urinary retention 10/26/2020   Vestibular schwannoma ([place]) 08/14/2020   Past Surgical History:  Procedure Laterality Date   APPENDECTOMY     BIV ICD INSERTION CRT-D N/A 11/03/2021   Procedure: BIV ICD INSERTION CRT-D;  Surgeon: Constance Haw, MD;  Location: [facility];  Service: Cardiovascular;  Laterality: N/A;   CARDIAC CATHETERIZATION     CORONARY ANGIOPLASTY WITH STENT PLACEMENT Left 04/12/2021   LAD stent placement   CORONARY STENT INTERVENTION N/A 04/12/2021   Procedure: CORONARY STENT INTERVENTION;  Surgeon: Nelva Bush, MD;  Location: [facility];  Service: Cardiovascular;  Laterality: N/A;   CORONARY/GRAFT ACUTE MI REVASCULARIZATION N/A 10/04/2020   Procedure: Coronary/Graft Acute MI Revascularization;  Surgeon: Nelva Bush, MD;  Location: [facility];  Service: Cardiovascular;  Laterality: N/A;   HERNIA REPAIR     INTRAVASCULAR PRESSURE WIRE/FFR STUDY N/A 04/12/2021   Procedure: INTRAVASCULAR PRESSURE WIRE/FFR STUDY;  Surgeon: Nelva Bush, MD;  Location: [facility];  Service: Cardiovascular;  Laterality: N/A;   INTRAVASCULAR ULTRASOUND/IVUS N/A 04/12/2021   Procedure: Intravascular Ultrasound/IVUS;  Surgeon: Nelva Bush, MD;  Location: [facility];  Service:  Cardiovascular;  Laterality: N/A;   LEFT HEART CATH AND CORONARY ANGIOGRAPHY N/A 10/04/2020   Procedure: LEFT HEART CATH AND CORONARY ANGIOGRAPHY;  Surgeon: Nelva Bush, MD;  Location: [facility];  Service: Cardiovascular;  Laterality: N/A;   RIGHT/LEFT HEART CATH AND CORONARY ANGIOGRAPHY N/A 04/12/2021   Procedure: RIGHT/LEFT HEART CATH AND CORONARY ANGIOGRAPHY;  Surgeon: Nelva Bush, MD;  Location: [facility];  Service: Cardiovascular;  Laterality: N/A;   Patient Active Problem List   Diagnosis Date Noted   Anemia    Leukocytosis    Right arm weakness 01/25/2022   Right sided weakness    Fall at home 01/22/2022   Status post biventricular pacemaker - MRI compatible PPM and leads 01/22/2022   Chronic indwelling Foley catheter 01/22/2022   CKD (chronic kidney disease), stage III - IV ([place]) 06/28/2021   Chronic HFrEF (heart failure with reduced ejection fraction) ([place]) 03/31/2021   Arrhythmia    DM (diabetes mellitus), type 2 ([place]) new 10/26/2020   Urinary retention 10/26/2020   Hyperlipidemia LDL goal <70 10/26/2020   Coronary artery disease of native artery of native heart with stable angina pectoris ([place]) 10/26/2020   S/P angioplasty with stent 10/04/20 DES to LCX  10/26/2020   Ischemic cardiomyopathy 10/26/2020   Diabetes mellitus, type 2 ([place]) 10/06/2020   DOE (dyspnea on exertion)    Meningioma ([place]) 08/14/2020   Vestibular schwannoma ([place]) 08/14/2020   Cardiac arrest with ventricular fibrillation (HCC)    Pain of left hip joint 05/29/2020   Benign brain tumor ([place])    Chronic kidney disease    History of pulmonary embolus (PE)    Hypertension    Nonsustained ventricular tachycardia ([place]) 10/11/2019   Dizziness 10/11/2019   Abnormal echocardiogram 06/07/2019   Pulmonary hypertension due to thromboembolism ([place]) 03/09/2018   Solitary pulmonary nodule on lung CT 03/08/2018   Ascending aortic aneurysm (HCC) 4.2 cm based on CT from December 2020  01/29/2018   Sinus bradycardia 12/22/2017   Left bundle branch block 12/19/2017   Personal history of pulmonary embolism - no longer on anticoagulation due to pt refusal to take it anymore. 12/19/2017   Chronic kidney disease, stage III (moderate) ([place]) 12/19/2017   Benign neoplasm of brain ([place]) 12/19/2017    ONSET DATE: 02/01/2022   REFERRING DIAG: Diagnosis R53.1 (ICD-10-CM) - Right sided weakness D32.9 (ICD-10-CM) - Meningioma (HCC)   THERAPY DIAG:  No diagnosis found.  Rationale for Evaluation and Treatment Rehabilitation  SUBJECTIVE:  SUBJECTIVE STATEMENT: Patient reports increased dizziness, increased fatigue. He had prostate surgery last week, but still has catheter.  PERTINENT HISTORY: Brief HPI:   Ahmaad Neidhardt is a 76 y.o. male who fell backward striking his head and right neck shoulder area on 01/21/2022. He complained of right hemiparesis. CT head revealed left parafalcine meningioma.Started on Decadron with improvement in symptoms   PAIN:  Are you having pain? No  PRECAUTIONS: None  WEIGHT BEARING RESTRICTIONS No  FALLS: Has patient fallen in last 6 months? Yes. Number of falls 1  LIVING ENVIRONMENT: Lives with: lives with their spouse Lives in: House/apartment Stairs: Yes: External: 7 steps; bilateral but cannot reach both Has following equipment at home: Walker - 2 wheeled  PLOF: [place] Patient would like to return to normal. Able to participate in all activities, he was not limited before.  OBJECTIVE:  MUSCLE LENGTH: Hamstrings: Right 72 deg; Left 78 deg Eval   POSTURE: rounded shoulders  LOWER EXTREMITY ROM:   PROM WFL in LE   LOWER EXTREMITY MMT:    MMT Right Eval Left Eval  Hip flexion 4 4  Hip extension 4- 4  Hip abduction 4- 4   Hip adduction    Hip internal rotation    Hip external rotation    Knee flexion 4 4  Knee extension 4 4  Ankle dorsiflexion 4 4  Ankle plantarflexion    Ankle inversion    Ankle eversion    (Blank rows = not tested)   FUNCTIONAL TESTs:  EVAL 5 times sit to stand: 16.97 Timed up and go (TUG): 14.79 Functional gait assessment: 13/30  TODAY'S TREATMENT:  03/28/22 Nu-Step L5 x 6 minutes. Sitting on physiodisc to mobilize trunk, ant/post, lateral, and clock face in each direction. Alternating step taps on 4" step. 10 reps each, then progressed to multiple taps with each foot, then progressed to placing 1 foot on the step, holding 4# weight in BUE and rotating, 10 reps on each leg. Ambulated while wearing 2# weights on each ankle and carrying 4# dumbells in each hand. He walked x 225', demonstrated much improved stability, balance, and smoothness of movement. Repeated walk while only holding the dumbells. He remained more stable. He does have dumbells and cuff weights at home. Therapist encouraged him to hold the dumbells or to try to attach the cuff weights to a belt at home and wear it around his waist as he awaits his upcoming Dr appointment in order to decrease fall risk. Practiced side to side stepping for balance, against green T band resistance. Improved balance once he held the dumbells in hands. B side stepping on Airex beam, 2 x 4 reps each direction. Performed in parallel bars with occasional need to hold on for balance.  03/18/22 Functional status re-assessed for PN. See goals for status. Ambulated x 800' with 2 episodes of LOB. Updated HEP with balance activities, patient performed each-side to side step, for/back step-both quick, romberg stance. 03/16/22 Nu-Step L5 x 6 minutes Wall bumps, moving progressively further from the wall, improved hip strategies for balance, but still had some unsteadiness. Alternating step taps on 6" step from Airex pad. Progressed to 2 taps each  time, CGA. Patient shaky, but minimal LOB noted. Floor ladder- Patient walked in multiple patterns involving changes of direction and weight shift, CGA, LOB x 2 required min A to recover. Repeated while holding 4# ball in BUE, minimal improvement noted. Walking with 1 1/2# weights on each limb. 1 x 150',  improved LE control noted, with decreased ataxic movements. Practiced forward/back, side to side and alternate tapping on a step while wearing 1 1/2# weights for improved LE control, CGA mild unsteadiness.    03/11/22 Recumbent bike 4.1 x 4 minutes, then 3 x 30 sec fast, > 80 RPM FGA re-assess 17/20 TG met Balance training on Air ex ant/post weight shifts, then marching, UUE support, held standing knee in slight flexion to increase stability. Sitting on physiodisc to mobilize lower trunk. Ant/Post, Lat, and then pelvic clocks in each direction, 10 reps each Standing rows with 20#, SH ext with 15#, sh ER with 10# resistance at power tower. 2 x 10 reps  03/09/22 Nu-Step L5 x 6 minutes. Balance training on floor mat-B side stepping, Alternate taps on 2" cushion on top of mat, progressed to 3 taps with each foot, 10 reps each- mild unsteadiness, patient mostly able to recover I. Wall bumps to facilitate improved control with backward weight shifts. As his feet moved progressively further from the wall, he struggled to control his posterior weight shift. Standing on floor mat, performed weight shifts forward and back, continued to have difficulty with control of weight shifts, occasional min A for balance. Toe walking, heel walking, 3 x 15' each, CGA. Walked while holding a 4# ball out in front with BUE to increase his stability and control. He walked with much less ataxia and improved stability.   03/03/22 Nu-Step L5 x 6 minutes Step taps on 6" step while holding 4# weight in BUE, forward, side taps x 10 each, Attempted tapping with rotation. Patient was able to tap out to the side and then rotate  to bring foot closer to midline, unable to reach midline without LOB.  Sat on Physiodisc for lower trunk mobilization and stabilization, ant/post, lat, clocks x 5 each way. Resisted gait with 30#, 5 reps each direction.  03/01/22 Nu-Step L5 x 6 minutes Lateral push on treadmill, 2 x 10 in each direction, BUE support Alternating step ups onto 6" step with B rails. Mod balance difficulty, required minA x 4 due to LOB and he recovered by himself at other times. 10 reps each leg. SLS with opposite foot on 6" step. Mini squats and rotation around stance leg without UE support, 5 of each, performed with each leg and attempted no UE support, required occasional grab of handrail and occasional min A. Ambulated while holsing 4# weight in BUE, with much more stable gait, less balance loss, 150', with multiple turns, CGA. Returned to steps and alternate taps, this time holding 4# weight in BUE. He completed 10 alternate taps and then 10 x 3 taps with each foot on 6" step without UE support, much more stable.    PATIENT EDUCATION: Education details: HEP, rest between exercises Person educated: patient Education method: Explanation, demo, handout Education comprehension: verbalized understanding   HOME EXERCISE PROGRAM: Access Code: [email or web address] URL: [email or web address] Date: 02/17/2022     GOALS: Goals reviewed with patient? Yes  SHORT TERM GOALS: Target date: 03/10/2022  I with initial HEP Baseline: Initiated Goal status: met   2.  FGA improved to 16/30 Baseline: 17 Goal status: met   LONG TERM GOALS: Target date: 04/21/2022  I with final HEP Baseline:  Goal status: ongoing  2.  Paitent will complete 5x STS in < 12 seconds to demosntrate improved balance and LE strength. Baseline: 6/1,16.9 with unsteadiness each time he rises. 7/7-12.01, uses hands, improved stability, but still mildly unsteady on decent. Goal status: ongoing  3.  Patient will ambulate x at least  1000' on level and unlevel surfaces I, no loss of balance or unsteadiness noted. Baseline: Ataxic, unsteady, 800', unlevel surfaces, 2 LOB Goal status: ongoing  4.  Complete TUG in < 10 sec to demosntrate improved balance Baseline: 6/1-14.79, 7/7-10.04 Goal status: ongoing  5.  Patient will score at least 21/27 Baseline: 13/27-Patient cannot perform heel toe gait. 6/29-17/27 Goal status: ongoing   ASSESSMENT:  CLINICAL IMPRESSION: Patient demonstrates increased Ataxia today. He responded well to weighted walking, with improved stability. He has a Dr appointment on 7/24 for assessment of his meningioma and will report the changes to the Dr.  Loletta Specter IMPAIRMENTS Abnormal gait, decreased balance, decreased coordination, decreased endurance, decreased mobility, difficulty walking, decreased ROM, decreased strength, impaired flexibility, improper body mechanics, and postural dysfunction.   ACTIVITY LIMITATIONS carrying, lifting, squatting, stairs, transfers, reach over head, and locomotion level  PARTICIPATION LIMITATIONS: cleaning, laundry, shopping, and yard work  [place] Past/current experiences and 1 comorbidity: L meningioma  are also affecting patient's functional outcome.   REHAB POTENTIAL: Good  CLINICAL DECISION MAKING: Evolving/moderate complexity  EVALUATION COMPLEXITY: Moderate  PLAN: PT FREQUENCY: 2x/week  PT DURATION: 10 weeks  PLANNED INTERVENTIONS: Therapeutic exercises, Therapeutic activity, Neuromuscular re-education, Balance training, Gait training, Patient/Family education, Joint mobilization, Stair training, and Manual therapy  PLAN FOR NEXT SESSION:  What did the Dr say?   Marcelina Morel, DPT 03/28/2022, 5:59 PM

## 2022-03-29 ENCOUNTER — Other Ambulatory Visit: Payer: Self-pay

## 2022-03-29 MED ORDER — HYDRALAZINE HCL 10 MG PO TABS: 10.0000 mg | ORAL_TABLET | Freq: Three times a day (TID) | ORAL | 3 refills | Status: DC

## 2022-04-04 DIAGNOSIS — D320 Benign neoplasm of cerebral meninges: Secondary | ICD-10-CM | POA: Diagnosis not present

## 2022-04-04 DIAGNOSIS — S061XAD Traumatic cerebral edema with loss of consciousness status unknown, subsequent encounter: Secondary | ICD-10-CM | POA: Diagnosis not present

## 2022-04-04 DIAGNOSIS — Z923 Personal history of irradiation: Secondary | ICD-10-CM | POA: Diagnosis not present

## 2022-04-04 DIAGNOSIS — D329 Benign neoplasm of meninges, unspecified: Secondary | ICD-10-CM | POA: Diagnosis not present

## 2022-04-05 ENCOUNTER — Telehealth: Payer: Self-pay | Admitting: Cardiology

## 2022-04-05 NOTE — Telephone Encounter (Signed)
Remote transmission reviewed. Normal device function. No episodes noted. Patient called, updated. Advised I will forward back to triage for f/u. Patient voiced understanding.

## 2022-04-05 NOTE — Telephone Encounter (Signed)
Patient c/o Palpitations:  High priority if patient c/o lightheadedness, shortness of breath, or chest pain  How long have you had palpitations/irregular HR/ Afib? Are you having the symptoms now?  Very strong heart beats, starting yesterday, no symptoms currently  Are you currently experiencing lightheadedness, SOB or CP?  No   Do you have a history of afib (atrial fibrillation) or irregular heart rhythm?  No   Have you checked your BP or HR? (document readings if available):  Around 107/75 yesterday   Are you experiencing any other symptoms?  Very tired

## 2022-04-05 NOTE — Telephone Encounter (Signed)
Spoke with pt who states that yesterday he felt bad most of the day and slept. Pt states his heart was beating very hard and wondered if something was wrong with his defib. Please advise pt.

## 2022-04-05 NOTE — Telephone Encounter (Signed)
Forwarding to device clinic for them to follow up with pt to send transmission

## 2022-04-06 ENCOUNTER — Ambulatory Visit: Payer: Medicare Other | Admitting: Physical Therapy

## 2022-04-06 NOTE — Telephone Encounter (Signed)
Pt reports this week he has experienced a few episodes of dizziness/weakness. Pt reports that his BP is "doing ok" -- 139/74, 139/80, 132/74, 128/93. He did have one low at 107/60 but unsure if symptomatic when he experienced the low pressure.  He is aware that may have been the culprit for symptoms. He reports his heart as having "harder beats, they were stronger than normal.  I could feel them beating and they were not as quick as they normally are".  Aware I will discuss transmission result w/ device clinic prior to forwarding this to MD. Patient verbalized understanding and agreeable to plan.

## 2022-04-07 ENCOUNTER — Telehealth: Payer: Self-pay | Admitting: Cardiology

## 2022-04-07 DIAGNOSIS — I959 Hypotension, unspecified: Secondary | ICD-10-CM

## 2022-04-07 DIAGNOSIS — R531 Weakness: Secondary | ICD-10-CM

## 2022-04-07 DIAGNOSIS — R001 Bradycardia, unspecified: Secondary | ICD-10-CM

## 2022-04-07 DIAGNOSIS — R5383 Other fatigue: Secondary | ICD-10-CM

## 2022-04-07 NOTE — Telephone Encounter (Signed)
Pt c/o Shortness Of Breath: STAT if SOB developed within the last 24 hours or pt is noticeably SOB on the phone  1. Are you currently SOB (can you hear that pt is SOB on the phone)? no  2. How long have you been experiencing SOB? Couple of weeks  3. Are you SOB when sitting or when up moving around? Moving around  4. Are you currently experiencing any other symptoms? Dizziness when standing up, patient is feels weak

## 2022-04-07 NOTE — Telephone Encounter (Signed)
Pt reported symptoms for 2 weeks- weak, tired, dizzy when getting up, short of breath with activity. BP's- 99/54-92/51-107/44. HR 60's, mild swelling at sock elastic. Has message in to have Defib checked as well. He is going to PT for strength and balance exercises, says its difficult and wears him out.

## 2022-04-08 ENCOUNTER — Ambulatory Visit: Payer: Medicare Other | Admitting: Physical Therapy

## 2022-04-08 ENCOUNTER — Encounter: Payer: Self-pay | Admitting: Physical Therapy

## 2022-04-08 DIAGNOSIS — R2689 Other abnormalities of gait and mobility: Secondary | ICD-10-CM | POA: Diagnosis not present

## 2022-04-08 DIAGNOSIS — D329 Benign neoplasm of meninges, unspecified: Secondary | ICD-10-CM | POA: Diagnosis not present

## 2022-04-08 DIAGNOSIS — I5022 Chronic systolic (congestive) heart failure: Secondary | ICD-10-CM | POA: Diagnosis not present

## 2022-04-08 DIAGNOSIS — E875 Hyperkalemia: Secondary | ICD-10-CM | POA: Diagnosis not present

## 2022-04-08 DIAGNOSIS — M6281 Muscle weakness (generalized): Secondary | ICD-10-CM

## 2022-04-08 DIAGNOSIS — R2681 Unsteadiness on feet: Secondary | ICD-10-CM | POA: Diagnosis not present

## 2022-04-08 DIAGNOSIS — R29898 Other symptoms and signs involving the musculoskeletal system: Secondary | ICD-10-CM | POA: Diagnosis not present

## 2022-04-08 DIAGNOSIS — R531 Weakness: Secondary | ICD-10-CM | POA: Diagnosis not present

## 2022-04-08 DIAGNOSIS — R278 Other lack of coordination: Secondary | ICD-10-CM

## 2022-04-08 DIAGNOSIS — R5383 Other fatigue: Secondary | ICD-10-CM | POA: Diagnosis not present

## 2022-04-08 DIAGNOSIS — R42 Dizziness and giddiness: Secondary | ICD-10-CM | POA: Diagnosis not present

## 2022-04-08 DIAGNOSIS — Y92009 Unspecified place in unspecified non-institutional (private) residence as the place of occurrence of the external cause: Secondary | ICD-10-CM

## 2022-04-08 DIAGNOSIS — R001 Bradycardia, unspecified: Secondary | ICD-10-CM | POA: Diagnosis not present

## 2022-04-08 DIAGNOSIS — I959 Hypotension, unspecified: Secondary | ICD-10-CM | POA: Diagnosis not present

## 2022-04-08 NOTE — Therapy (Signed)
OUTPATIENT PHYSICAL THERAPY TREATMENT PHYSICAL THERAPY DISCHARGE SUMMARY  Visits from Start of Care: 12  Current functional level related to goals / functional outcomes: See clinical impression-patient with new onset of increased dizziness, SOB   Remaining deficits: Dizziness, increased fall risk, palpitations   Education / Equipment: HEP, request new order for PT once his medical issues have been addressed.   Patient agrees to discharge. Patient goals were partially met. Patient is being discharged due to a change in medical status.   Patient Name: Timothy Moran MRN: [id number] DOB:12-18-1945, 76 y.o., male Today's Date: 04/08/2022   PCP: Cari Caraway REFERRING PROVIDER: Barbie Banner, PA-C    PT End of Session - 04/08/22 1038     Visit Number 12    Date for PT Re-Evaluation 04/21/22    PT Start Time 1012    PT Stop Time 1035    PT Time Calculation (min) 23 min    Equipment Utilized During Treatment Gait belt    Activity Tolerance Patient tolerated treatment well    Behavior During Therapy [facility] for tasks assessed/performed                     Past Medical History:  Diagnosis Date   Acute blood loss anemia 10/05/2020   Acute respiratory failure ([place]), hypothermia therapy, vent - extubated 10/06/20    AKI (acute kidney injury) ([place]) 10/05/2020   Anoxic brain injury ([place]) 10/26/2020   Arrhythmia    Ascending aortic aneurysm ([place]) 01/29/2018   a. 2019 4.3 cm by echo; b. 02/2021 Echo: Ao root 21mm.   Benign brain tumor ([place])    Benign neoplasm of brain ([place]) 12/19/2017   Cardiac arrest with ventricular fibrillation ([place])    Chest pain 12/04/2017   Chronic kidney disease, stage III (moderate) ([place]) 12/19/2017   CKD (chronic kidney disease), stage III - IV (HCC)    Coronary Artery Disease    a. 11/8099 Inf STEMI complicated by cardiac arrest/PCI: LAD 50p/m, LCX 100d (2.5 x 30 Resolute Onyx DES); b. 04/2021 PCI: 04/2021 LM nl, LAD 50p/9m (2.5x30 Onyx  [place] DES), LCX 25p, 13m (RFR 0.98), 40d ISR (RFR 0.98), OM2 25, RCA mild diff dzs.   Diabetes mellitus, type 2 ([place]) 10/06/2020   10/06/20 A1C 7%   Dizziness 10/11/2019   Encounter for imaging study to confirm orogastric (OG) tube placement    Hematuria 10/26/2020   HFrEF (heart failure with reduced ejection fraction) ([place])    a. 11/2020 Echo: EF 25-30%; b. 02/2021 Echo: EF 25-30%, glob HK. Mild LVH. Nl RV fxn. Triv AI. Ao root 10mm.   History of pulmonary embolus (PE)    HLD (hyperlipidemia) 10/26/2020   Hypertension    Iron deficiency anemia rec'd IV iron 10/26/2020   Ischemic cardiomyopathy    a. 11/2020 Echo: EF 25-30%; b. 02/2021 Echo: EF 25-30%, glob HK.   Left bundle branch block 12/19/2017   Meningioma ([place]) 08/14/2020   Nonsustained ventricular tachycardia ([place]) 10/11/2019   Pain of left hip joint 05/29/2020   Personal history of pulmonary embolism 12/19/2017   Pneumonia of both lungs due to infectious organism    Pulmonary hypertension due to thromboembolism ([place]) 03/09/2018   See echo 12/27/17 with nl PAS vs CTa chest 02/23/18    S/P angioplasty with stent 10/04/20 DES to LCX  10/26/2020   Sinus bradycardia 12/22/2017   SOB (shortness of breath)    Solitary pulmonary nodule on lung CT 03/08/2018   CT 12/04/17 1.0 x 0.8 x  0.7 cm nodular opacity in the RUL vs not seen 03/16/10 posterior segment of the right upper lobe. [place] KitchenSpirometry 03/08/2018    FEV1 3.86 (108%)  Ratio 96 s prior rx  - PET  03/13/18   Low grade c/w adenoca > rec T surgery eval     STEMI (ST elevation myocardial infarction) ([place]) 10/04/2020   Urinary retention 10/26/2020   Vestibular schwannoma ([place]) 08/14/2020   Past Surgical History:  Procedure Laterality Date   APPENDECTOMY     BIV ICD INSERTION CRT-D N/A 11/03/2021   Procedure: BIV ICD INSERTION CRT-D;  Surgeon: Constance Haw, MD;  Location: [facility];  Service: Cardiovascular;  Laterality: N/A;   CARDIAC CATHETERIZATION     CORONARY ANGIOPLASTY  WITH STENT PLACEMENT Left 04/12/2021   LAD stent placement   CORONARY STENT INTERVENTION N/A 04/12/2021   Procedure: CORONARY STENT INTERVENTION;  Surgeon: Nelva Bush, MD;  Location: [place] [facility];  Service: Cardiovascular;  Laterality: N/A;   CORONARY/GRAFT ACUTE MI REVASCULARIZATION N/A 10/04/2020   Procedure: Coronary/Graft Acute MI Revascularization;  Surgeon: Nelva Bush, MD;  Location: [place] [facility];  Service: Cardiovascular;  Laterality: N/A;   HERNIA REPAIR     INTRAVASCULAR PRESSURE WIRE/FFR STUDY N/A 04/12/2021   Procedure: INTRAVASCULAR PRESSURE WIRE/FFR STUDY;  Surgeon: Nelva Bush, MD;  Location: [place] [facility];  Service: Cardiovascular;  Laterality: N/A;   INTRAVASCULAR ULTRASOUND/IVUS N/A 04/12/2021   Procedure: Intravascular Ultrasound/IVUS;  Surgeon: Nelva Bush, MD;  Location: [place] [facility];  Service: Cardiovascular;  Laterality: N/A;   LEFT HEART CATH AND CORONARY ANGIOGRAPHY N/A 10/04/2020   Procedure: LEFT HEART CATH AND CORONARY ANGIOGRAPHY;  Surgeon: Nelva Bush, MD;  Location: [place] [facility];  Service: Cardiovascular;  Laterality: N/A;   RIGHT/LEFT HEART CATH AND CORONARY ANGIOGRAPHY N/A 04/12/2021   Procedure: RIGHT/LEFT HEART CATH AND CORONARY ANGIOGRAPHY;  Surgeon: Nelva Bush, MD;  Location: [place] [facility];  Service: Cardiovascular;  Laterality: N/A;   Patient Active Problem List   Diagnosis Date Noted   Anemia    Leukocytosis    Right arm weakness 01/25/2022   Right sided weakness    Fall at home 01/22/2022   Status post biventricular pacemaker - MRI compatible PPM and leads 01/22/2022   Chronic indwelling Foley catheter 01/22/2022   CKD (chronic kidney disease), stage III - IV ([place]) 06/28/2021   Chronic HFrEF (heart failure with reduced ejection fraction) ([place]) 03/31/2021   Arrhythmia    DM (diabetes mellitus), type 2 ([place]) new 10/26/2020   Urinary retention 10/26/2020   Hyperlipidemia LDL goal <70  10/26/2020   Coronary artery disease of native artery of native heart with stable angina pectoris ([place]) 10/26/2020   S/P angioplasty with stent 10/04/20 DES to LCX  10/26/2020   Ischemic cardiomyopathy 10/26/2020   Diabetes mellitus, type 2 ([place]) 10/06/2020   DOE (dyspnea on exertion)    Meningioma ([place]) 08/14/2020   Vestibular schwannoma ([place]) 08/14/2020   Cardiac arrest with ventricular fibrillation (HCC)    Pain of left hip joint 05/29/2020   Benign brain tumor ([place])    Chronic kidney disease    History of pulmonary embolus (PE)    Hypertension    Nonsustained ventricular tachycardia ([place]) 10/11/2019   Dizziness 10/11/2019   Abnormal echocardiogram 06/07/2019   Pulmonary hypertension due to thromboembolism ([place]) 03/09/2018   Solitary pulmonary nodule on lung CT 03/08/2018   Ascending aortic aneurysm (HCC) 4.2 cm based on CT from December 2020 01/29/2018   Sinus  bradycardia 12/22/2017   Left bundle branch block 12/19/2017   Personal history of pulmonary embolism - no longer on anticoagulation due to pt refusal to take it anymore. 12/19/2017   Chronic kidney disease, stage III (moderate) ([place]) 12/19/2017   Benign neoplasm of brain ([place]) 12/19/2017    ONSET DATE: 02/01/2022   REFERRING DIAG: Diagnosis R53.1 (ICD-10-CM) - Right sided weakness D32.9 (ICD-10-CM) - Meningioma (HCC)   THERAPY DIAG:  Other abnormalities of gait and mobility  Other lack of coordination  Meningioma (HCC)  Muscle weakness (generalized)  Unsteadiness on feet  Fall in home, initial encounter  Rationale for Evaluation and Treatment Rehabilitation  SUBJECTIVE:                                                                                                                                                                                              SUBJECTIVE STATEMENT: Patient reports increased dizziness with increased LOB and swaying noted upon entry to therapy. He is scheduled for F/U assessment  of his meningioma-recent MRI showed no real changes in the meningiomas, but increased swelling in brain.  PERTINENT HISTORY: Brief HPI:   Raiford Fetterman is a 76 y.o. male who fell backward striking his head and right neck shoulder area on 01/21/2022. He complained of right hemiparesis. CT head revealed left parafalcine meningioma.Started on Decadron with improvement in symptoms   PAIN:  Are you having pain? No  PRECAUTIONS: None  WEIGHT BEARING RESTRICTIONS No  FALLS: Has patient fallen in last 6 months? Yes. Number of falls 1  LIVING ENVIRONMENT: Lives with: lives with their spouse Lives in: House/apartment Stairs: Yes: External: 7 steps; bilateral but cannot reach both Has following equipment at home: Walker - 2 wheeled  PLOF: [place] Patient would like to return to normal. Able to participate in all activities, he was not limited before.  OBJECTIVE:  MUSCLE LENGTH: Hamstrings: Right 72 deg; Left 78 deg Eval   POSTURE: rounded shoulders  LOWER EXTREMITY ROM:   PROM WFL in LE   LOWER EXTREMITY MMT:    MMT Right Eval Left Eval  Hip flexion 4 4  Hip extension 4- 4  Hip abduction 4- 4  Hip adduction    Hip internal rotation    Hip external rotation    Knee flexion 4 4  Knee extension 4 4  Ankle dorsiflexion 4 4  Ankle plantarflexion    Ankle inversion    Ankle eversion    (Blank rows = not tested)   FUNCTIONAL TESTs:  EVAL 5 times sit to stand: 16.97 Timed up and go (TUG): 14.79 Functional gait assessment:  13/30  TODAY'S TREATMENT:  04/08/22 Recumbent bike L3 x 6 minutes- Patient was severely SOB, with increased effort noted throughout. 03/28/22 Nu-Step L5 x 6 minutes. Sitting on physiodisc to mobilize trunk, ant/post, lateral, and clock face in each direction. Alternating step taps on 4" step. 10 reps each, then progressed to multiple taps with each foot, then progressed to placing 1 foot on the step, holding 4# weight in BUE and  rotating, 10 reps on each leg. Ambulated while wearing 2# weights on each ankle and carrying 4# dumbells in each hand. He walked x 225', demonstrated much improved stability, balance, and smoothness of movement. Repeated walk while only holding the dumbells. He remained more stable. He does have dumbells and cuff weights at home. Therapist encouraged him to hold the dumbells or to try to attach the cuff weights to a belt at home and wear it around his waist as he awaits his upcoming Dr appointment in order to decrease fall risk. Practiced side to side stepping for balance, against green T band resistance. Improved balance once he held the dumbells in hands. B side stepping on Airex beam, 2 x 4 reps each direction. Performed in parallel bars with occasional need to hold on for balance.  03/18/22 Functional status re-assessed for PN. See goals for status. Ambulated x 800' with 2 episodes of LOB. Updated HEP with balance activities, patient performed each-side to side step, for/back step-both quick, romberg stance. 03/16/22 Nu-Step L5 x 6 minutes Wall bumps, moving progressively further from the wall, improved hip strategies for balance, but still had some unsteadiness. Alternating step taps on 6" step from Airex pad. Progressed to 2 taps each time, CGA. Patient shaky, but minimal LOB noted. Floor ladder- Patient walked in multiple patterns involving changes of direction and weight shift, CGA, LOB x 2 required min A to recover. Repeated while holding 4# ball in BUE, minimal improvement noted. Walking with 1 1/2# weights on each limb. 1 x 150', improved LE control noted, with decreased ataxic movements. Practiced forward/back, side to side and alternate tapping on a step while wearing 1 1/2# weights for improved LE control, CGA mild unsteadiness.    03/11/22 Recumbent bike 4.1 x 4 minutes, then 3 x 30 sec fast, > 80 RPM FGA re-assess 17/20 TG met Balance training on Air ex ant/post weight shifts, then  marching, UUE support, held standing knee in slight flexion to increase stability. Sitting on physiodisc to mobilize lower trunk. Ant/Post, Lat, and then pelvic clocks in each direction, 10 reps each Standing rows with 20#, SH ext with 15#, sh ER with 10# resistance at power tower. 2 x 10 reps  03/09/22 Nu-Step L5 x 6 minutes. Balance training on floor mat-B side stepping, Alternate taps on 2" cushion on top of mat, progressed to 3 taps with each foot, 10 reps each- mild unsteadiness, patient mostly able to recover I. Wall bumps to facilitate improved control with backward weight shifts. As his feet moved progressively further from the wall, he struggled to control his posterior weight shift. Standing on floor mat, performed weight shifts forward and back, continued to have difficulty with control of weight shifts, occasional min A for balance. Toe walking, heel walking, 3 x 15' each, CGA. Walked while holding a 4# ball out in front with BUE to increase his stability and control. He walked with much less ataxia and improved stability.   03/03/22 Nu-Step L5 x 6 minutes Step taps on 6" step while holding 4# weight in BUE, forward,  side taps x 10 each, Attempted tapping with rotation. Patient was able to tap out to the side and then rotate to bring foot closer to midline, unable to reach midline without LOB.  Sat on Physiodisc for lower trunk mobilization and stabilization, ant/post, lat, clocks x 5 each way. Resisted gait with 30#, 5 reps each direction.  03/01/22 Nu-Step L5 x 6 minutes Lateral push on treadmill, 2 x 10 in each direction, BUE support Alternating step ups onto 6" step with B rails. Mod balance difficulty, required minA x 4 due to LOB and he recovered by himself at other times. 10 reps each leg. SLS with opposite foot on 6" step. Mini squats and rotation around stance leg without UE support, 5 of each, performed with each leg and attempted no UE support, required occasional grab of  handrail and occasional min A. Ambulated while holsing 4# weight in BUE, with much more stable gait, less balance loss, 150', with multiple turns, CGA. Returned to steps and alternate taps, this time holding 4# weight in BUE. He completed 10 alternate taps and then 10 x 3 taps with each foot on 6" step without UE support, much more stable.    PATIENT EDUCATION: Education details: HEP, rest between exercises Person educated: patient Education method: Explanation, demo, handout Education comprehension: verbalized understanding   HOME EXERCISE PROGRAM: Access Code: [email or web address] URL: [email or web address] Date: 02/17/2022     GOALS: Goals reviewed with patient? Yes  SHORT TERM GOALS: Target date: 03/10/2022  I with initial HEP Baseline: Initiated Goal status: met   2.  FGA improved to 16/30 Baseline: 17 Goal status: met   LONG TERM GOALS: Target date: 04/21/2022  I with final HEP Baseline:  Goal status: met  2.  Paitent will complete 5x STS in < 12 seconds to demosntrate improved balance and LE strength. Baseline: 6/1,16.9 with unsteadiness each time he rises. 7/7-12.01, uses hands, improved stability, but still mildly unsteady on decent. Goal status: not met  3.  Patient will ambulate x at least 1000' on level and unlevel surfaces I, no loss of balance or unsteadiness noted. Baseline: Ataxic, unsteady, 800', unlevel surfaces, 2 LOB Goal status: not met  4.  Complete TUG in < 10 sec to demosntrate improved balance Baseline: 6/1-14.79, 7/7-10.04 Goal status: not met  5.  Patient will score at least 21/27 Baseline: 13/27-Patient cannot perform heel toe gait. 6/29-17/27 Goal status: not met   ASSESSMENT:  CLINICAL IMPRESSION: Patient stood to walk back to treatment and demonstrated LOB x 2 as well as static stand to gather himself. He verbalized increased dizziness recently as well as inconsistent palpitations in his chest. He is in contact with his  cardiologist and his neurologist, awaiting follow up from both. He pedalled bike x 6 minutes, demonstrated increased SOB and grunting effort. Patient and therapist determined the best plan of action at this point is to D/C his PT until his medical issues have been addressed as his new symptoms are concerning. Therapist encouraged him to use a cane or RW during gait to decrease fall risk.  OBJECTIVE IMPAIRMENTS Abnormal gait, decreased balance, decreased coordination, decreased endurance, decreased mobility, difficulty walking, decreased ROM, decreased strength, impaired flexibility, improper body mechanics, and postural dysfunction.   ACTIVITY LIMITATIONS carrying, lifting, squatting, stairs, transfers, reach over head, and locomotion level  PARTICIPATION LIMITATIONS: cleaning, laundry, shopping, and yard work  PERSONAL FACTORS Past/current experiences and 1 comorbidity: L meningioma  are also affecting patient's functional outcome.   REHAB POTENTIAL: Good  CLINICAL DECISION MAKING: Evolving/moderate complexity  EVALUATION COMPLEXITY: Moderate  PLAN: PT FREQUENCY: 2x/week  PT DURATION: 10 weeks  PLANNED INTERVENTIONS: Therapeutic exercises, Therapeutic activity, Neuromuscular re-education, Balance training, Gait training, Patient/Family education, Joint mobilization, Stair training, and Manual therapy  PLAN FOR NEXT SESSION:  D/C PT, patient to request a new order once his new symptoms have been addressed.   Marcelina Morel, DPT 04/08/2022, 10:40 AM

## 2022-04-08 NOTE — Telephone Encounter (Signed)
Spoke with pt about Dr. Wendy Poet recommendations regarding getting lab work. He stated that he would go to the lab now. He stated that he is weak, Dizzy and fatigued. Will follow up Monday.

## 2022-04-09 LAB — BASIC METABOLIC PANEL
BUN/Creatinine Ratio: 13 (ref 10–24)
BUN: 28 mg/dL — ABNORMAL HIGH (ref 8–27)
CO2: 17 mmol/L — ABNORMAL LOW (ref 20–29)
Calcium: 8.8 mg/dL (ref 8.6–10.2)
Chloride: 104 mmol/L (ref 96–106)
Creatinine, Ser: 2.23 mg/dL — ABNORMAL HIGH (ref 0.76–1.27)
Glucose: 141 mg/dL — ABNORMAL HIGH (ref 70–99)
Potassium: 5.3 mmol/L — ABNORMAL HIGH (ref 3.5–5.2)
Sodium: 138 mmol/L (ref 134–144)
eGFR: 29 mL/min/{1.73_m2} — ABNORMAL LOW (ref >59)

## 2022-04-09 LAB — CBC
Hematocrit: 31.8 % — ABNORMAL LOW (ref 37.5–51.0)
Hemoglobin: 10.6 g/dL — ABNORMAL LOW (ref 13.0–17.7)
MCH: 31.5 pg (ref 26.6–33.0)
MCHC: 33.3 g/dL (ref 31.5–35.7)
MCV: 94 fL (ref 79–97)
Platelets: 212 10*3/uL (ref 150–450)
RBC: 3.37 x10E6/uL — ABNORMAL LOW (ref 4.14–5.80)
RDW: 14.6 % (ref 11.6–15.4)
WBC: 8.5 10*3/uL (ref 3.4–10.8)

## 2022-04-09 LAB — PRO B NATRIURETIC PEPTIDE: NT-Pro BNP: 3220 pg/mL — ABNORMAL HIGH (ref 0–486)

## 2022-04-11 ENCOUNTER — Ambulatory Visit: Payer: Medicare Other | Admitting: Physical Therapy

## 2022-04-11 ENCOUNTER — Telehealth: Payer: Self-pay | Admitting: Cardiology

## 2022-04-11 NOTE — Telephone Encounter (Signed)
Patient called stating he would like to having his Defib adjusted.  He is getting fatigued, can't really do any physical if he is out walking she seems he can't catch he breath. When he is active he has to stop and rest. He states he is getting dizzy when he first gets up.

## 2022-04-11 NOTE — Telephone Encounter (Signed)
Called patient and advised I spoke with [name] nurse Venida Jarvis and she has his report to review with [name]. After she reviews someone will f/u to see if his device needs adjustment. Patient voiced understanding.

## 2022-04-11 NOTE — Telephone Encounter (Signed)
Pt aware [name] reviewed, informed pt he is having PACs per MD. Informed that MD recommends f/u w/ EP APP. Aware scheduler will call to arrange OV. Patient verbalized understanding and agreeable to plan.

## 2022-04-13 ENCOUNTER — Telehealth: Payer: Self-pay

## 2022-04-13 ENCOUNTER — Ambulatory Visit: Payer: Medicare Other | Admitting: Physical Therapy

## 2022-04-13 DIAGNOSIS — N183 Chronic kidney disease, stage 3 unspecified: Secondary | ICD-10-CM

## 2022-04-13 DIAGNOSIS — I5022 Chronic systolic (congestive) heart failure: Secondary | ICD-10-CM

## 2022-04-13 NOTE — Telephone Encounter (Signed)
Patient is calling back to follow-up on his Defib concern.  Patient states he gets very fatigued.

## 2022-04-13 NOTE — Telephone Encounter (Signed)
-----   Message from Park Liter, MD sent at 04/13/2022 10:54 AM EDT ----- Laboratory test show congestive heart failure, also some worsening of the kidney function.  Potassium was elevated therefore please for now we will repeat his Chem-7.

## 2022-04-13 NOTE — Telephone Encounter (Signed)
Patient notified of results, aware to come by today or tomorrow for blood work.

## 2022-04-13 NOTE — Telephone Encounter (Signed)
Apt scheduled with Tommye Standard on 04/19/22 at 8:25

## 2022-04-14 DIAGNOSIS — N183 Chronic kidney disease, stage 3 unspecified: Secondary | ICD-10-CM | POA: Diagnosis not present

## 2022-04-14 DIAGNOSIS — I5022 Chronic systolic (congestive) heart failure: Secondary | ICD-10-CM | POA: Diagnosis not present

## 2022-04-15 LAB — BASIC METABOLIC PANEL
BUN/Creatinine Ratio: 9 — ABNORMAL LOW (ref 10–24)
BUN: 18 mg/dL (ref 8–27)
CO2: 17 mmol/L — ABNORMAL LOW (ref 20–29)
Calcium: 9.1 mg/dL (ref 8.6–10.2)
Chloride: 104 mmol/L (ref 96–106)
Creatinine, Ser: 2.06 mg/dL — ABNORMAL HIGH (ref 0.76–1.27)
Glucose: 154 mg/dL — ABNORMAL HIGH (ref 70–99)
Potassium: 4.7 mmol/L (ref 3.5–5.2)
Sodium: 139 mmol/L (ref 134–144)
eGFR: 32 mL/min/{1.73_m2} — ABNORMAL LOW (ref >59)

## 2022-04-17 NOTE — Progress Notes (Signed)
Cardiology Office Note Date:  04/19/2022  Patient ID:  Timothy Moran, DOB 1945/12/05, MRN [id number] PCP:  Cari Caraway, MD  Cardiologist:  [name] Electrophysiologist: [name]    Chief Complaint: fatigue  History of Present Illness: Timothy Moran is a 76 y.o. male with history of CAD (STEMI associated with VF arrest, PCI Aug 2022 with intervention/PCI  mid LAD), ICM, chronic CHF (systolic), LBBB, ICD, HTN, HLD, ascending Ao aneurysm, NSVT, CKD (III), DM meningioma s/p gamma knife radiation  He was hospitalized 01/22/2022 - 01/25/22  sided weakness following a fall at home. MRI of the brain as well as C-spine without acute findings, but showed his known meningiomas with slightly worsening surrounding edema.   Neurology and neurosurgery were consulted.  He was treated with IV steroids and subsequent oral steroid taper.  Cardiology was consulted due to possible atrial fibrillation, however, EKG and telemetry showed sinus rhythm with PACs andnot formally seen.   Discharged to Inpatient medicine and rehab due to physical deconditioning, right hemiparalysis.   Subsequent MRI of the shoulder showed right rotator cuff tear.   ARB was discontinued in the setting of acute renal failure.    He saw [name], NP 02/22/22 for pre-op evaluation prior to a urology procedure, no cardiac/anginal sounding symptoms.  Felt to be an acceptable risk for the planned procedure.  He saw [name] 02/28/22, discussed a trip/fall accident that required rehab and was recovering.  Also deemed an acceptable risk for his urological procedure. No changes made  03/16/22 there is an in clinic device check with OptiVol way up but coming down, 83% BP and effective BP% Noting frequent PACs falling in refractory causing AS/VS , felt to be causing his reduction in BP% and his PVARP changed from auto to 25m in effort to sense these and track them  Pt called 04/05/22 with c/o palpitations, normal device  remote reported, Dr. CCurt Bearsreviewed and noted PACs recommended OV w/EP APP.   He called [name]7/27/23 with SOB, reported a couple weeks worth, tired, low BPs  Planned for labs, Dr. KOrpah Clintonreport note comments on worsening kidney function and elevated K+ with evidence of HF, planned to repeat labs  Called device our office again, 04/11/22, wanted to get his ICD checked, programmed, 2/2 , advised to keep his scheduled appt.  04/08/22  K+ 5.3 BUN/Creat 17/2.23 BNP 3220  04/14/22  K+ 4.7 BUN/Creat 18/2.06  Baseline Creat looks best 1.8, mostly in the low 2's...   TODAY He is accompanied by his wife. Reports an awareness of strong/irregular heartbeats, not clearly associated with symptoms. Fatigue seems to be new for about 2 mo, tired all of the time, no energy Not lightheaded, not fainting But easily winded and tired. Wishes he had more energy, tired. He stopped his ASA some time ago 2/2 nose bleeds that were significant and hard to stop No CP (He was maintained on ASA after his hospitalization with the meningioma and he comfirms he has never been asked to stop it because of that)  Pending further management of his meningioma, sounds like with radiation tx, not yet scheduled, [facility] CRT-D implanted 11/03/2021   Past Medical History:  Diagnosis Date   Acute blood loss anemia 10/05/2020   Acute respiratory failure ([place], hypothermia therapy, vent - extubated 10/06/20    AKI (acute kidney injury) ([place] 10/05/2020   Anoxic brain injury ([facility] 10/26/2020   Arrhythmia    Ascending aortic aneurysm ([facility] 01/29/2018  a. 2019 4.3 cm by echo; b. 02/2021 Echo: Ao root 59m.   Benign brain tumor ([facility]    Benign neoplasm of brain ([facility] 12/19/2017   Cardiac arrest with ventricular fibrillation ([facility]    Chest pain 12/04/2017   Chronic kidney disease, stage III (moderate) ([facility] 12/19/2017   CKD (chronic kidney disease), stage III - IV (HCC)     Coronary Artery Disease    a. 15/4656Inf STEMI complicated by cardiac arrest/PCI: LAD 50p/m, LCX 100d (2.5 x 30 Resolute Onyx DES); b. 04/2021 PCI: 04/2021 LM nl, LAD 50p/871m2.5x30 Onyx Frontier DES), LCX 25p, 5039mFR 0.98), 40d ISR (RFR 0.98), OM2 25, RCA mild diff dzs.   Diabetes mellitus, type 2 ([place]1/25/2022   10/06/20 A1C 7%   Dizziness 10/11/2019   Encounter for imaging study to confirm orogastric (OG) tube placement    Hematuria 10/26/2020   HFrEF (heart failure with reduced ejection fraction) ([place]  a. 11/2020 Echo: EF 25-30%; b. 02/2021 Echo: EF 25-30%, glob HK. Mild LVH. Nl RV fxn. Triv AI. Ao root 72m75m History of pulmonary embolus (PE)    HLD (hyperlipidemia) 10/26/2020   Hypertension    Iron deficiency anemia rec'd IV iron 10/26/2020   Ischemic cardiomyopathy    a. 11/2020 Echo: EF 25-30%; b. 02/2021 Echo: EF 25-30%, glob HK.   Left bundle branch block 12/19/2017   Meningioma (HCC)[place]/11/2019   Nonsustained ventricular tachycardia (HCC)[place]/29/2021   Pain of left hip joint 05/29/2020   Personal history of pulmonary embolism 12/19/2017   Pneumonia of both lungs due to infectious organism    Pulmonary hypertension due to thromboembolism (HCC)[place]/28/2019   See echo 12/27/17 with nl PAS vs CTa chest 02/23/18    S/P angioplasty with stent 10/04/20 DES to LCX  10/26/2020   Sinus bradycardia 12/22/2017   SOB (shortness of breath)    Solitary pulmonary nodule on lung CT 03/08/2018   CT 12/04/17 1.0 x 0.8 x 0.7 cm nodular opacity in the RUL vs not seen 03/16/10 posterior segment of the right upper lobe. .[facility] 03/08/2018    FEV1 3.86 (108%)  Ratio 96 s prior rx  - PET  03/13/18   Low grade c/w adenoca > rec T surgery eval     STEMI (ST elevation myocardial infarction) (HCC)[place]/23/2022   Urinary retention 10/26/2020   Vestibular schwannoma (HCC)[place]/11/2019    Past Surgical History:  Procedure Laterality Date   APPENDECTOMY     BIV ICD INSERTION CRT-D N/A 11/03/2021   Procedure:  BIV ICD INSERTION CRT-D;  Surgeon: CamnConstance Haw;  [facility];  Service: Cardiovascular;  Laterality: N/A;   CARDIAC CATHETERIZATION     CORONARY ANGIOPLASTY WITH STENT PLACEMENT Left 04/12/2021   LAD stent placement   CORONARY STENT INTERVENTION N/A 04/12/2021   Procedure: CORONARY STENT INTERVENTION;  Surgeon: End,Nelva Bush;  [facility];  Service: Cardiovascular;  Laterality: N/A;   CORONARY/GRAFT ACUTE MI REVASCULARIZATION N/A 10/04/2020   Procedure: Coronary/Graft Acute MI Revascularization;  Surgeon: End,Nelva Bush;  [facility];  Service: Cardiovascular;  Laterality: N/A;   HERNIA REPAIR     INTRAVASCULAR PRESSURE WIRE/FFR STUDY N/A 04/12/2021   Procedure: INTRAVASCULAR PRESSURE WIRE/FFR STUDY;  Surgeon: End,Nelva Bush;  [facility];  Service: Cardiovascular;  Laterality: N/A;   INTRAVASCULAR ULTRASOUND/IVUS N/A 04/12/2021   Procedure: Intravascular Ultrasound/IVUS;  Surgeon: End,Nelva Bush;  [facility];  Service: Cardiovascular;  Laterality: N/A;   LEFT HEART CATH AND CORONARY ANGIOGRAPHY N/A 10/04/2020   Procedure: LEFT HEART CATH AND CORONARY ANGIOGRAPHY;  Surgeon: Nelva Bush, MD;  Location: [facility] [facility];  Service: Cardiovascular;  Laterality: N/A;   RIGHT/LEFT HEART CATH AND CORONARY ANGIOGRAPHY N/A 04/12/2021   Procedure: RIGHT/LEFT HEART CATH AND CORONARY ANGIOGRAPHY;  Surgeon: Nelva Bush, MD;  Location: [place] [facility];  Service: Cardiovascular;  Laterality: N/A;    Current Outpatient Medications  Medication Sig Dispense Refill   acetaminophen (TYLENOL) 325 MG tablet Take 1-2 tablets (325-650 mg total) by mouth every 4 (four) hours as needed for mild pain.     atorvastatin (LIPITOR) 80 MG tablet Take 1 tablet (80 mg total) by mouth daily. 90 tablet 3   B Complex-C (SUPER B COMPLEX PO) Take 1 tablet by mouth daily. Unknown strength      fluticasone (FLONASE) 50 MCG/ACT nasal spray Place 1-2 sprays into both nostrils at bedtime.  2   hydrALAZINE (APRESOLINE) 10 MG tablet Take 1 tablet (10 mg total) by mouth 3 (three) times daily. 270 tablet 3   isosorbide dinitrate (ISORDIL) 30 MG tablet Take 1 tablet (30 mg total) by mouth 2 (two) times daily. 180 tablet 2   melatonin 5 MG TABS Take 1 tablet (5 mg total) by mouth at bedtime. 30 tablet 0   metoprolol succinate (TOPROL-XL) 50 MG 24 hr tablet TAKE 1 TABLET DAILY, WITH  OR IMMEDIATELY FOLLOWING A MEAL 90 tablet 3   Multiple Vitamin (MULTIVITAMIN WITH MINERALS) TABS tablet Take 1 tablet by mouth daily.     senna-docusate (SENOKOT-S) 8.6-50 MG tablet Take 1 tablet by mouth at bedtime as needed for mild constipation.     No current facility-administered medications for this visit.    Allergies:   Patient has no known allergies.   Social History:  The patient  reports that he quit smoking about 53 years ago. His smoking use included cigarettes. He has a 2.50 pack-year smoking history. He has never used smokeless tobacco. He reports current alcohol use. He reports that he does not use drugs.   Family History:  The patient's family history includes Brain cancer in his sister; Diabetes in his mother; Leukemia in his brother; Melanoma in his brother.  ROS:  Please see the history of present illness.    All other systems are reviewed and otherwise negative.   PHYSICAL EXAM:  VS:  BP 110/70   Pulse 80   Ht '6\' 1"'$  (1.854 m)   Wt 219 lb 9.6 oz (99.6 kg)   SpO2 94%   BMI 28.97 kg/m  BMI: Body mass index is 28.97 kg/m. Well nourished, well developed, in no acute distress HEENT: normocephalic, atraumatic Neck: no JVD, carotid bruits or masses Cardiac:  RRR; a couple extrasystoles heart. no significant murmurs, no rubs, or gallops Lungs:  CTA b/l, no wheezing, rhonchi or rales Abd: soft, nontender MS: no deformity or atrophy Ext: no edema Skin: warm and dry, no rash Neuro:  No  gross deficits appreciated Psych: euthymic mood, full affect  ICD site is stable, no tethering or discomfort   EKG:  Done today and reviewed by myself shows  SR 80bpm V paced, PAC (Vpaced) and a PVC, IVCD 159m  Device interrogation done today and reviewed by myself:  Battery and lead measurements are good BP 91.4% effective 90.8% No arrhythmias   [facility] 04/12/21  Review of the above records today demonstrates:  Severe single-vessel coronary  artery disease with multifocal disease of the proximal and mid LAD of up to 80% that is hemodynamically significant by RFR and IVUS criteria. Moderate LCx disease with 50% de novo stenosis proximal to stent placed in 09/2020.  There is also 40% in-stent restenosis in the LCx/OM2 stent.  These lesions are not hemodynamically significant (RFR = 0.98). Mildly elevated left heart, right heart, and pulmonary artery pressures. Normal Fick cardiac output/index. Successful IVUS- and RFR-guided PCI to 80% mid LAD stenosis using Onyx Frontier 2.5 x 30 mm drug-eluting stent (postdilated to 3.2 mm) with 0% residual stenosis and TIMI-3 flow.   TTE 07/16/21  1. Left ventricular ejection fraction, by estimation, is 30 to 35%. The  left ventricle has moderately decreased function. The left ventricle has  no regional wall motion abnormalities. Left ventricular diastolic  parameters are indeterminate.   2. Right ventricular systolic function is normal. The right ventricular  size is normal.   3. The mitral valve is normal in structure. No evidence of mitral valve  regurgitation. No evidence of mitral stenosis.   4. The aortic valve is normal in structure. Aortic valve regurgitation is  trivial. No aortic stenosis is present.   5. Aneurysm of the ascending aorta, measuring 44 mm.   6. The inferior vena cava is normal in size with greater than 50%  respiratory variability, suggesting right atrial pressure of 3 mmHg.   Recent Labs: 01/25/2022: Magnesium  2.0 01/26/2022: ALT 22 04/08/2022: Hemoglobin 10.6; NT-Pro BNP 3,220; Platelets 212 04/14/2022: BUN 18; Creatinine, Ser 2.06; Potassium 4.7; Sodium 139  No results found for requested labs within last 365 days.   Estimated Creatinine Clearance: 36.7 mL/min (A) (by C-G formula based on SCr of 2.06 mg/dL (H)).   Wt Readings from Last 3 Encounters:  04/19/22 219 lb 9.6 oz (99.6 kg)  02/28/22 223 lb (101.2 kg)  02/22/22 219 lb 9.6 oz (99.6 kg)     Other studies reviewed: Additional studies/records reviewed today include: summarized above  ASSESSMENT AND PLAN:  ICD Intact function No programming changes made today  CAD No anginal symptoms Discussed role of his ASA, he is agreeable to trying ECASA '81mg'$  QOD and discussing further with [name] at his upcoming visit. If he has recurrent struggles with nose bleeds, perhaps a ENT visit if needed  ICM Chronic CHF (systolic) His effective BP% is up to 90%, I observed PVCs His PACs are being tracked  QRS is 156. I think he has BP room for a bit more Toprol, will start by increaseing his dose to '100mg'$  daily and hopefully reduce his PACs/PVCs for better BP% His Optivol is back down and looks good. Exam does not appear volume OL He sees [name] in a couple weeks.  We can see him back in a month and perhaps think about EKG optimization if needed.   Disposition: F/u as above  Current medicines are reviewed at length with the patient today.  The patient did not have any concerns regarding medicines.  Venetia Night, PA-C 04/19/2022 9:19 AM     [address] [place] [address] [phone] (office)  [phone] (fax)

## 2022-04-19 ENCOUNTER — Encounter: Payer: Self-pay | Admitting: Physician Assistant

## 2022-04-19 ENCOUNTER — Ambulatory Visit (INDEPENDENT_AMBULATORY_CARE_PROVIDER_SITE_OTHER): Payer: Medicare Other | Admitting: Physician Assistant

## 2022-04-19 VITALS — BP 110/70 | HR 80 | Ht 73.0 in | Wt 219.6 lb

## 2022-04-19 DIAGNOSIS — I491 Atrial premature depolarization: Secondary | ICD-10-CM | POA: Diagnosis not present

## 2022-04-19 DIAGNOSIS — I2510 Atherosclerotic heart disease of native coronary artery without angina pectoris: Secondary | ICD-10-CM

## 2022-04-19 DIAGNOSIS — I493 Ventricular premature depolarization: Secondary | ICD-10-CM

## 2022-04-19 DIAGNOSIS — I255 Ischemic cardiomyopathy: Secondary | ICD-10-CM | POA: Diagnosis not present

## 2022-04-19 DIAGNOSIS — I5022 Chronic systolic (congestive) heart failure: Secondary | ICD-10-CM | POA: Diagnosis not present

## 2022-04-19 DIAGNOSIS — Z95810 Presence of automatic (implantable) cardiac defibrillator: Secondary | ICD-10-CM

## 2022-04-19 LAB — CUP PACEART INCLINIC DEVICE CHECK
Battery Remaining Longevity: 112 mo
Battery Voltage: 3.03 V
Brady Statistic AP VP Percent: 7.46 %
Brady Statistic AP VS Percent: 0.11 %
Brady Statistic AS VP Percent: 89.57 %
Brady Statistic AS VS Percent: 2.87 %
Brady Statistic RA Percent Paced: 7.38 %
Brady Statistic RV Percent Paced: 43.12 %
Date Time Interrogation Session: 20230808101833
HighPow Impedance: 65 Ohm
Implantable Lead Implant Date: 20230222
Implantable Lead Implant Date: 20230222
Implantable Lead Implant Date: 20230222
Implantable Lead Location: 753858
Implantable Lead Location: 753859
Implantable Lead Location: 753860
Implantable Lead Model: 4798
Implantable Lead Model: 5076
Implantable Pulse Generator Implant Date: 20230222
Lead Channel Impedance Value: 205.2 Ohm
Lead Channel Impedance Value: 208.174
Lead Channel Impedance Value: 216.367
Lead Channel Impedance Value: 261.164
Lead Channel Impedance Value: 274.19 Ohm
Lead Channel Impedance Value: 342 Ohm
Lead Channel Impedance Value: 380 Ohm
Lead Channel Impedance Value: 513 Ohm
Lead Channel Impedance Value: 532 Ohm
Lead Channel Impedance Value: 570 Ohm
Lead Channel Impedance Value: 589 Ohm
Lead Channel Impedance Value: 627 Ohm
Lead Channel Impedance Value: 760 Ohm
Lead Channel Impedance Value: 779 Ohm
Lead Channel Impedance Value: 817 Ohm
Lead Channel Impedance Value: 836 Ohm
Lead Channel Impedance Value: 855 Ohm
Lead Channel Impedance Value: 950 Ohm
Lead Channel Pacing Threshold Amplitude: 0.5 V
Lead Channel Pacing Threshold Amplitude: 0.75 V
Lead Channel Pacing Threshold Amplitude: 1 V
Lead Channel Pacing Threshold Pulse Width: 0.4 ms
Lead Channel Pacing Threshold Pulse Width: 0.4 ms
Lead Channel Pacing Threshold Pulse Width: 0.4 ms
Lead Channel Sensing Intrinsic Amplitude: 0.875 mV
Lead Channel Sensing Intrinsic Amplitude: 1.125 mV
Lead Channel Sensing Intrinsic Amplitude: 14.875 mV
Lead Channel Sensing Intrinsic Amplitude: 15.625 mV
Lead Channel Setting Pacing Amplitude: 1.5 V
Lead Channel Setting Pacing Amplitude: 1.5 V
Lead Channel Setting Pacing Amplitude: 2 V
Lead Channel Setting Pacing Pulse Width: 0.4 ms
Lead Channel Setting Pacing Pulse Width: 0.4 ms
Lead Channel Setting Sensing Sensitivity: 0.3 mV

## 2022-04-19 MED ORDER — METOPROLOL SUCCINATE ER 100 MG PO TB24: 100.0000 mg | ORAL_TABLET | Freq: Every day | ORAL | 1 refills | Status: DC

## 2022-04-19 NOTE — Patient Instructions (Signed)
Medication Instructions:   START TAKING: TOPROL 100 MG ONCE A DAY   START TAKING:  ASPIRIN 81 MG  EVERY OTHER DAY   *If you need a refill on your cardiac medications before your next appointment, please call your pharmacy*   Lab Work: NONE ORDERED  TODAY    If you have labs (blood work) drawn today and your tests are completely normal, you will receive your results only by: [place] (if you have MyChart) OR A paper copy in the mail If you have any lab test that is abnormal or we need to change your treatment, we will call you to review the results.   Testing/Procedures: NONE ORDERED  TODAY    Follow-Up: At [facility], you and your health needs are our priority.  As part of our continuing mission to provide you with exceptional heart care, we have created designated Provider Care Teams.  These Care Teams include your primary Cardiologist (physician) and Advanced Practice Providers (APPs -  Physician Assistants and Nurse Practitioners) who all work together to provide you with the care you need, when you need it.  We recommend signing up for the patient portal called "MyChart".  Sign up information is provided on this After Visit Summary.  MyChart is used to connect with patients for Virtual Visits (Telemedicine).  Patients are able to view lab/test results, encounter notes, upcoming appointments, etc.  Non-urgent messages can be sent to your provider as well.   To learn more about what you can do with MyChart, go to [email or web address].    Your next appointment:    1 month(s) FOR DEVICE OPTIMIZATION   The format for your next appointment:   In Person  Provider:   You will see one of the following Advanced Practice Providers on your designated Care Team:   Tommye Standard, [place] Legrand Como "Jonni Sanger" Chalmers Cater, [place]    Other Instructions :  PLEASE MONITOR YOUR BLOOD PRESSURE AT HOME AND CONTACT OFFICE IF BLOOD PRESSURE IS RUNNING REAL LOW    Important Information About  [place]

## 2022-04-22 ENCOUNTER — Telehealth: Payer: Self-pay

## 2022-04-22 NOTE — Telephone Encounter (Signed)
Results reviewed with pt as per Dr. Krasowski's note.  Pt verbalized understanding and had no additional questions. Routed to PCP  

## 2022-04-26 DIAGNOSIS — Z950 Presence of cardiac pacemaker: Secondary | ICD-10-CM | POA: Diagnosis not present

## 2022-04-26 DIAGNOSIS — Z923 Personal history of irradiation: Secondary | ICD-10-CM | POA: Diagnosis not present

## 2022-04-26 DIAGNOSIS — D329 Benign neoplasm of meninges, unspecified: Secondary | ICD-10-CM | POA: Diagnosis not present

## 2022-04-26 DIAGNOSIS — J90 Pleural effusion, not elsewhere classified: Secondary | ICD-10-CM | POA: Diagnosis not present

## 2022-04-26 DIAGNOSIS — D320 Benign neoplasm of cerebral meninges: Secondary | ICD-10-CM | POA: Diagnosis not present

## 2022-04-28 ENCOUNTER — Telehealth: Payer: Self-pay | Admitting: Cardiology

## 2022-04-28 ENCOUNTER — Encounter: Payer: Self-pay | Admitting: Cardiology

## 2022-04-28 NOTE — Telephone Encounter (Signed)
From last ov note 04/19/22 w R. Charlcie Cradle, PA-C. Chronic CHF (systolic) His effective BP% is up to 90%, I observed PVCs His PACs are being tracked  QRS is 156. I think he has BP room for a bit more Toprol, will start by increaseing his dose to '100mg'$  daily and hopefully reduce his PACs/PVCs for better BP%  ________________________________________________________________  Pt now reporting fatigue and no energy.  Wants to check to see if it is being caused by the Toprol 100 mg dose.  Will route to APP for recommendations and will follow up with patient.

## 2022-04-28 NOTE — Telephone Encounter (Signed)
Pt c/o medication issue:  1. Name of Medication: metoprolol succinate (TOPROL-XL) 100 MG 24 hr tablet  2. How are you currently taking this medication (dosage and times per day)? 1 tablet daily  3. Are you having a reaction (difficulty breathing--STAT)? no  4. What is your medication issue? Patient states he has been feeling very tired and has no energy. He says his metoprolol was increased from 50 mg to 100 mg and is not sure if that is why. He would like to know if there is a way to check if it is the medication.

## 2022-04-28 NOTE — Telephone Encounter (Signed)
error 

## 2022-04-29 MED ORDER — METOPROLOL SUCCINATE ER 50 MG PO TB24: 50.0000 mg | ORAL_TABLET | Freq: Every day | ORAL | 3 refills | Status: DC

## 2022-04-29 NOTE — Telephone Encounter (Signed)
Pt calling to f/u on call from yesterday regarding medication. He states that no one has called him back. Please advise

## 2022-04-29 NOTE — Telephone Encounter (Signed)
Called pt back in regards to metoprolol succinate causing increased fatigue.  Reports started in the last week or so.  Can't do normal activities.  Has to stop and rest when walking and this is new per pt.  Reports recent BP have been normal.  Did not have any recent readings.  Advised pt to cut metoprolol dose in half to 50 mg PO QD.  Take with food.  Advised will contact pt with Renee's once she has reviewed.

## 2022-04-29 NOTE — Telephone Encounter (Signed)
Called pt advised or providers recommendation to resume Metoprolol succinate 50 mg PO QD.  Advised pt to call in with an update on symptoms during the upcoming week.  Expresses understanding no further questions or concerns voiced.

## 2022-05-01 ENCOUNTER — Emergency Department (HOSPITAL_COMMUNITY): Payer: Medicare Other

## 2022-05-01 ENCOUNTER — Other Ambulatory Visit: Payer: Self-pay

## 2022-05-01 ENCOUNTER — Encounter (HOSPITAL_COMMUNITY): Payer: Self-pay

## 2022-05-01 ENCOUNTER — Emergency Department (HOSPITAL_COMMUNITY)
Admission: EM | Admit: 2022-05-01 | Discharge: 2022-05-01 | Disposition: A | Discharge status: Home / Self Care | Payer: Medicare Other | Attending: Emergency Medicine | Admitting: Emergency Medicine

## 2022-05-01 DIAGNOSIS — Z7982 Long term (current) use of aspirin: Secondary | ICD-10-CM | POA: Insufficient documentation

## 2022-05-01 DIAGNOSIS — Z79899 Other long term (current) drug therapy: Secondary | ICD-10-CM | POA: Insufficient documentation

## 2022-05-01 DIAGNOSIS — M545 Low back pain, unspecified: Secondary | ICD-10-CM | POA: Diagnosis not present

## 2022-05-01 DIAGNOSIS — D320 Benign neoplasm of cerebral meninges: Secondary | ICD-10-CM | POA: Insufficient documentation

## 2022-05-01 DIAGNOSIS — M549 Dorsalgia, unspecified: Secondary | ICD-10-CM | POA: Diagnosis not present

## 2022-05-01 DIAGNOSIS — S0083XA Contusion of other part of head, initial encounter: Secondary | ICD-10-CM | POA: Diagnosis not present

## 2022-05-01 DIAGNOSIS — I10 Essential (primary) hypertension: Secondary | ICD-10-CM | POA: Diagnosis not present

## 2022-05-01 DIAGNOSIS — W010XXA Fall on same level from slipping, tripping and stumbling without subsequent striking against object, initial encounter: Secondary | ICD-10-CM | POA: Insufficient documentation

## 2022-05-01 DIAGNOSIS — D329 Benign neoplasm of meninges, unspecified: Secondary | ICD-10-CM

## 2022-05-01 DIAGNOSIS — S0990XA Unspecified injury of head, initial encounter: Secondary | ICD-10-CM | POA: Diagnosis present

## 2022-05-01 DIAGNOSIS — R58 Hemorrhage, not elsewhere classified: Secondary | ICD-10-CM | POA: Diagnosis not present

## 2022-05-01 DIAGNOSIS — M542 Cervicalgia: Secondary | ICD-10-CM | POA: Insufficient documentation

## 2022-05-01 DIAGNOSIS — R221 Localized swelling, mass and lump, neck: Secondary | ICD-10-CM | POA: Diagnosis not present

## 2022-05-01 DIAGNOSIS — R220 Localized swelling, mass and lump, head: Secondary | ICD-10-CM | POA: Diagnosis not present

## 2022-05-01 DIAGNOSIS — M1611 Unilateral primary osteoarthritis, right hip: Secondary | ICD-10-CM | POA: Diagnosis not present

## 2022-05-01 DIAGNOSIS — Z043 Encounter for examination and observation following other accident: Secondary | ICD-10-CM | POA: Diagnosis not present

## 2022-05-01 LAB — CBC WITH DIFFERENTIAL/PLATELET
Abs Immature Granulocytes: 0.09 10*3/uL — ABNORMAL HIGH (ref 0.00–0.07)
Basophils Absolute: 0.1 10*3/uL (ref 0.0–0.1)
Basophils Relative: 1 %
Eosinophils Absolute: 0.3 10*3/uL (ref 0.0–0.5)
Eosinophils Relative: 2 %
HCT: 34.4 % — ABNORMAL LOW (ref 39.0–52.0)
Hemoglobin: 10.8 g/dL — ABNORMAL LOW (ref 13.0–17.0)
Immature Granulocytes: 1 %
Lymphocytes Relative: 22 %
Lymphs Abs: 2.3 10*3/uL (ref 0.7–4.0)
MCH: 31.6 pg (ref 26.0–34.0)
MCHC: 31.4 g/dL (ref 30.0–36.0)
MCV: 100.6 fL — ABNORMAL HIGH (ref 80.0–100.0)
Monocytes Absolute: 1 10*3/uL (ref 0.1–1.0)
Monocytes Relative: 10 %
Neutro Abs: 6.7 10*3/uL (ref 1.7–7.7)
Neutrophils Relative %: 64 %
Platelets: 278 10*3/uL (ref 150–400)
RBC: 3.42 MIL/uL — ABNORMAL LOW (ref 4.22–5.81)
RDW: 14.6 % (ref 11.5–15.5)
WBC: 10.3 10*3/uL (ref 4.0–10.5)
nRBC: 0 % (ref 0.0–0.2)

## 2022-05-01 LAB — COMPREHENSIVE METABOLIC PANEL
ALT: 15 U/L (ref 0–44)
AST: 23 U/L (ref 15–41)
Albumin: 3.1 g/dL — ABNORMAL LOW (ref 3.5–5.0)
Alkaline Phosphatase: 63 U/L (ref 38–126)
Anion gap: 9 (ref 5–15)
BUN: 19 mg/dL (ref 8–23)
CO2: 16 mmol/L — ABNORMAL LOW (ref 22–32)
Calcium: 8.7 mg/dL — ABNORMAL LOW (ref 8.9–10.3)
Chloride: 111 mmol/L (ref 98–111)
Creatinine, Ser: 2 mg/dL — ABNORMAL HIGH (ref 0.61–1.24)
GFR, Estimated: 34 mL/min — ABNORMAL LOW (ref >60)
Glucose, Bld: 115 mg/dL — ABNORMAL HIGH (ref 70–99)
Potassium: 4.3 mmol/L (ref 3.5–5.1)
Sodium: 136 mmol/L (ref 135–145)
Total Bilirubin: 0.6 mg/dL (ref 0.3–1.2)
Total Protein: 6 g/dL — ABNORMAL LOW (ref 6.5–8.1)

## 2022-05-01 LAB — URINALYSIS, ROUTINE W REFLEX MICROSCOPIC
Bilirubin Urine: NEGATIVE
Glucose, UA: NEGATIVE mg/dL
Hgb urine dipstick: NEGATIVE
Ketones, ur: NEGATIVE mg/dL
Nitrite: NEGATIVE
Protein, ur: 30 mg/dL — AB
Specific Gravity, Urine: 1.015 (ref 1.005–1.030)
pH: 6 (ref 5.0–8.0)

## 2022-05-01 LAB — URINALYSIS, MICROSCOPIC (REFLEX): WBC, UA: >50 WBC/hpf (ref 0–5)

## 2022-05-01 MED ORDER — OXYCODONE HCL 5 MG PO TABS
5.0000 mg | ORAL_TABLET | Freq: Once | ORAL | Status: AC
  Administered 2022-05-01: 5 mg via ORAL
  Filled 2022-05-01: qty 1

## 2022-05-01 MED ORDER — ACETAMINOPHEN 500 MG PO TABS
1000.0000 mg | ORAL_TABLET | Freq: Once | ORAL | Status: AC
  Administered 2022-05-01: 1000 mg via ORAL
  Filled 2022-05-01: qty 2

## 2022-05-01 MED ORDER — MORPHINE SULFATE 15 MG PO TABS: 7.5000 mg | ORAL_TABLET | ORAL | 0 refills | Status: DC | PRN

## 2022-05-01 MED ORDER — DICLOFENAC SODIUM 1 % EX GEL: 4.0000 g | Freq: Four times a day (QID) | CUTANEOUS | 0 refills | Status: AC

## 2022-05-01 NOTE — ED Notes (Addendum)
Patient transported to CT 

## 2022-05-01 NOTE — Discharge Instructions (Signed)
Please follow-up with the neurosurgeon in the office.  He can follow-up with the neurosurgeon that you have already establish care with or if you would like to see the neurosurgeon in our system and I provided their information for you in this paperwork.  Please let your family doctor know how you are doing as well.  Please return for one-sided numbness or weakness difficulty speech or swallowing.  Please return for inability to walk.  Use the gel as prescribed. Also take tylenol '1000mg'$ (2 extra strength) four times a day.   Then take the pain medicine if you feel like you need it. Narcotics do not help with the pain, they only make you care about it less.  You can become addicted to this, people may break into your house to steal it.  It will constipate you.  If you drive under the influence of this medicine you can get a DUI.

## 2022-05-01 NOTE — ED Triage Notes (Signed)
Pt BIB [facility] from home d/t falling when he got up to go to the bathroom. He denies dizziness or LOC. Has hematoma to Rt forehead. C-collar in place, c/o lower/mid back pain that does not bother him when he lays flat. He does have pacemaker & EMS states that he was showing multifocal PVC with occasional bigeminy. 148/96, 96% on RA. Has 20g in Lt AC.

## 2022-05-01 NOTE — ED Notes (Signed)
Pt did well ambulating in hall only complaints was neck pain.

## 2022-05-01 NOTE — ED Provider Notes (Signed)
[facility] EMERGENCY DEPARTMENT Provider Note   CSN: [id number] Arrival date & time: 05/01/22  7035     History  Chief Complaint  Patient presents with   Lytle Michaels    Burak Zerbe is a 76 y.o. male.  75 yo M with a cc of a fall.  Patient with hx of meningioma. Seen after a fall in may, has been in PT.  Patient got up to go to the bathroom and lost his balance and fell.  Complaining of pain mostly to the neck, head, waist.     Fall       Home Medications Prior to Admission medications   Medication Sig Start Date End Date Taking? Authorizing Provider  diclofenac Sodium (VOLTAREN) 1 % GEL Apply 4 g topically 4 (four) times daily. 05/01/22  Yes Deno Etienne, DO  morphine (MSIR) 15 MG tablet Take 0.5 tablets (7.5 mg total) by mouth every 4 (four) hours as needed for severe pain. 05/01/22  Yes Deno Etienne, DO  acetaminophen (TYLENOL) 325 MG tablet Take 1-2 tablets (325-650 mg total) by mouth every 4 (four) hours as needed for mild pain. 02/02/22   Setzer, Edman Circle, PA-C  aspirin EC 81 MG tablet Take 81 mg by mouth every other day. Swallow whole.    [provider]  atorvastatin (LIPITOR) 80 MG tablet Take 1 tablet (80 mg total) by mouth daily. 12/10/21   Park Liter, MD  B Complex-C (SUPER B COMPLEX PO) Take 1 tablet by mouth daily. Unknown strength    [provider]  fluticasone (FLONASE) 50 MCG/ACT nasal spray Place 1-2 sprays into both nostrils at bedtime. 02/02/22   Setzer, Edman Circle, PA-C  hydrALAZINE (APRESOLINE) 10 MG tablet Take 1 tablet (10 mg total) by mouth 3 (three) times daily. 03/29/22   Park Liter, MD  isosorbide dinitrate (ISORDIL) 30 MG tablet Take 1 tablet (30 mg total) by mouth 2 (two) times daily. 12/10/21   Park Liter, MD  melatonin 5 MG TABS Take 1 tablet (5 mg total) by mouth at bedtime. 11/06/20   Love, Ivan Anchors, PA-C  metoprolol succinate (TOPROL-XL) 50 MG 24 hr tablet Take 1 tablet (50 mg total) by mouth  daily. Take with or immediately following a meal. 04/29/22   Baldwin Jamaica, PA-C  Multiple Vitamin (MULTIVITAMIN WITH MINERALS) TABS tablet Take 1 tablet by mouth daily. 11/07/20   Love, Ivan Anchors, PA-C  senna-docusate (SENOKOT-S) 8.6-50 MG tablet Take 1 tablet by mouth at bedtime as needed for mild constipation. 02/02/22   Setzer, Edman Circle, PA-C      Allergies    Patient has no known allergies.    Review of Systems   Review of Systems  Physical Exam Updated Vital Signs BP 136/86   Pulse 65   Temp 97.9 F (36.6 C)   Resp 18   SpO2 95%  Physical Exam Vitals and nursing note reviewed.  Constitutional:      Appearance: He is well-developed.  HENT:     Head: Normocephalic.     Comments: Right frontal hematoma with a small abrasion  Diffuse C-spine tenderness without obvious focality. Eyes:     Pupils: Pupils are equal, round, and reactive to light.  Neck:     Vascular: No JVD.  Cardiovascular:     Rate and Rhythm: Normal rate and regular rhythm.     Heart sounds: No murmur heard.    No friction rub. No gallop.  Pulmonary:  Effort: No respiratory distress.     Breath sounds: No wheezing.  Abdominal:     General: There is no distension.     Tenderness: There is no abdominal tenderness. There is no guarding or rebound.  Musculoskeletal:        General: Normal range of motion.     Cervical back: Normal range of motion and neck supple.     Comments: No obvious midline spinal tenderness step-offs or deformities.  Complains of pain to the top of the iliac crest bilaterally.  Able to internal and external rotate bilateral lower extremities without pain.  No obvious pain with compression of the pelvis.  Skin:    Coloration: Skin is not pale.     Findings: No rash.  Neurological:     Mental Status: He is alert and oriented to person, place, and time.  Psychiatric:        Behavior: Behavior normal.     ED Results / Procedures / Treatments   Labs (all labs ordered are  listed, but only abnormal results are displayed) Labs Reviewed  CBC WITH DIFFERENTIAL/PLATELET - Abnormal; Notable for the following components:      Result Value   RBC 3.42 (*)    Hemoglobin 10.8 (*)    HCT 34.4 (*)    MCV 100.6 (*)    Abs Immature Granulocytes 0.09 (*)    All other components within normal limits  COMPREHENSIVE METABOLIC PANEL - Abnormal; Notable for the following components:   CO2 16 (*)    Glucose, Bld 115 (*)    Creatinine, Ser 2.00 (*)    Calcium 8.7 (*)    Total Protein 6.0 (*)    Albumin 3.1 (*)    GFR, Estimated 34 (*)    All other components within normal limits  URINALYSIS, ROUTINE W REFLEX MICROSCOPIC - Abnormal; Notable for the following components:   Protein, ur 30 (*)    Leukocytes,Ua MODERATE (*)    All other components within normal limits  URINALYSIS, MICROSCOPIC (REFLEX) - Abnormal; Notable for the following components:   Bacteria, UA RARE (*)    All other components within normal limits    EKG EKG Interpretation  Date/Time:  Sunday May 01 2022 08:56:50 EDT Ventricular Rate:  66 PR Interval:  154 QRS Duration: 152 QT Interval:  464 QTC Calculation: 486 R Axis:   241 Text Interpretation: Atrial-sensed ventricular-paced rhythm with occasional Premature ventricular complexes Abnormal ECG No significant change since last tracing Confirmed by Deno Etienne [phone]) on 05/01/2022 9:05:15 AM  Radiology DG Pelvis 1-2 Views  Result Date: 05/01/2022 CLINICAL DATA:  Fall. EXAM: PELVIS - 1 VIEW COMPARISON:  None similar FINDINGS: Total left hip arthroplasty which is well seated and located. Mild degenerative spurring at the right hip. Degenerative spurring at the lumbosacral junction. Generalized osteopenia. Hernia repair anchors over the right hemipelvis. No evidence of acute fracture. IMPRESSION: No acute finding. Electronically Signed   By: Jorje Guild M.D.   On: 05/01/2022 08:42   CT Lumbar Spine Wo Contrast  Result Date:  05/01/2022 CLINICAL DATA:  Back pain.  History of fall EXAM: CT LUMBAR SPINE WITHOUT CONTRAST TECHNIQUE: Multidetector CT imaging of the lumbar spine was performed without intravenous contrast administration. Multiplanar CT image reconstructions were also generated. RADIATION DOSE REDUCTION: This exam was performed according to the departmental dose-optimization program which includes automated exposure control, adjustment of the mA and/or kV according to patient size and/or use of iterative reconstruction technique. COMPARISON:  Radiography 01/21/2022 FINDINGS:  Segmentation: 5 lumbar type vertebrae Alignment: Normal Vertebrae: No acute fracture or aggressive bone lesion. Bridging sacroiliac osteophytes. Paraspinal and other soft tissues: Negative for perispinal mass or hematoma. Gas in the urinary bladder with mild perivesicular fat stranding. Disc levels: Spondylitic spurring. Calcified disc protrusion at L2-3 which is left eccentric. Moderate spinal stenosis at this level. Mild generalized facet spurring.  Diffusely patent foramina IMPRESSION: 1. No acute finding. 2. Pneumaturia and mild perivesicular fat stranding, please correlate with urinalysis. 3. Moderate degenerative spinal stenosis at L2-3. Electronically Signed   By: Jorje Guild M.D.   On: 05/01/2022 08:29   CT Head Wo Contrast  Result Date: 05/01/2022 CLINICAL DATA:  Fall at home going to the bathroom EXAM: CT HEAD WITHOUT CONTRAST CT CERVICAL SPINE WITHOUT CONTRAST TECHNIQUE: Multidetector CT imaging of the head and cervical spine was performed following the standard protocol without intravenous contrast. Multiplanar CT image reconstructions of the cervical spine were also generated. RADIATION DOSE REDUCTION: This exam was performed according to the departmental dose-optimization program which includes automated exposure control, adjustment of the mA and/or kV according to patient size and/or use of iterative reconstruction technique.  COMPARISON:  Brain MRI 01/22/2022 FINDINGS: CT HEAD FINDINGS Brain: No evidence of acute infarction, hemorrhage, hydrocephalus, extra-axial collection. Heavily calcified left parafalcine mass measuring 2.6 cm with regional vasogenic edema in the left cerebral hemisphere. Left anterior frontal dural based mass measuring 3.4 x 1.2 cm, with mild frontal lobe mass effect. Asymmetric calcification at the atrium of the left lateral ventricle. These findings are stable from prior brain MRI and attributed to meningioma. Mild cerebral volume loss for age. Vascular: No acute finding Skull: Right anterior scalp swelling without calvarial fracture Sinuses/Orbits: No evidence of injury CT CERVICAL SPINE FINDINGS Alignment: No traumatic malalignment. Skull base and vertebrae: No acute fracture. Soft tissues and spinal canal: Prevertebral swelling at the level of C4-5. Disc levels: Bulky spondylitic spurring. Bridging osteophytes C5-T1. Upper chest: No acute finding IMPRESSION: 1. No evidence of intracranial injury. 2. Negative for cervical spine fracture but there is prevertebral edema at C4-5 suggesting soft tissue injury. 3. Meningiomas with left cerebral of edema, reference brain MRI 01/22/2022. Electronically Signed   By: Jorje Guild M.D.   On: 05/01/2022 08:27   CT Cervical Spine Wo Contrast  Result Date: 05/01/2022 CLINICAL DATA:  Fall at home going to the bathroom EXAM: CT HEAD WITHOUT CONTRAST CT CERVICAL SPINE WITHOUT CONTRAST TECHNIQUE: Multidetector CT imaging of the head and cervical spine was performed following the standard protocol without intravenous contrast. Multiplanar CT image reconstructions of the cervical spine were also generated. RADIATION DOSE REDUCTION: This exam was performed according to the departmental dose-optimization program which includes automated exposure control, adjustment of the mA and/or kV according to patient size and/or use of iterative reconstruction technique. COMPARISON:   Brain MRI 01/22/2022 FINDINGS: CT HEAD FINDINGS Brain: No evidence of acute infarction, hemorrhage, hydrocephalus, extra-axial collection. Heavily calcified left parafalcine mass measuring 2.6 cm with regional vasogenic edema in the left cerebral hemisphere. Left anterior frontal dural based mass measuring 3.4 x 1.2 cm, with mild frontal lobe mass effect. Asymmetric calcification at the atrium of the left lateral ventricle. These findings are stable from prior brain MRI and attributed to meningioma. Mild cerebral volume loss for age. Vascular: No acute finding Skull: Right anterior scalp swelling without calvarial fracture Sinuses/Orbits: No evidence of injury CT CERVICAL SPINE FINDINGS Alignment: No traumatic malalignment. Skull base and vertebrae: No acute fracture. Soft tissues and spinal canal: Prevertebral  swelling at the level of C4-5. Disc levels: Bulky spondylitic spurring. Bridging osteophytes C5-T1. Upper chest: No acute finding IMPRESSION: 1. No evidence of intracranial injury. 2. Negative for cervical spine fracture but there is prevertebral edema at C4-5 suggesting soft tissue injury. 3. Meningiomas with left cerebral of edema, reference brain MRI 01/22/2022. Electronically Signed   By: Jorje Guild M.D.   On: 05/01/2022 08:27    Procedures Procedures    Medications Ordered in ED Medications  acetaminophen (TYLENOL) tablet 1,000 mg (1,000 mg Oral Given 05/01/22 0949)  oxyCODONE (Oxy IR/ROXICODONE) immediate release tablet 5 mg (5 mg Oral Given 05/01/22 6606)    ED Course/ Medical Decision Making/ A&P                           Medical Decision Making Amount and/or Complexity of Data Reviewed Labs: ordered. Radiology: ordered. ECG/medicine tests: ordered.  Risk OTC drugs. Prescription drug management.   76 yo M with a cc of a fall.  Patient complaining of headache, neck pain and pain to the waist.    CT head, C spine, L spine.  Plain film of the pelvis.   CT of the head  with known meningioma.  Some vasogenic edema.  CT C-spine and L-spine independently interpreted by me without fracture.  Plain film of the pelvis independently interpreted by me without fracture or dislocation.  Radiology read with possible bladder irritation, will add on UA.  UA negative for infection is independently interpreted by me.  We will attempt to ambulate.  Patient was able to ambulate.  Still complaining of severe neck pain.  I discussed the case with neurosurgery, Weston Brass independent interpretation of the images of both the meningioma as well as the C-spine.  No further imaging recommended.  Recommended soft collar follow-up with their primary neurosurgeon in the office.  10:48 AM:  I have discussed the diagnosis/risks/treatment options with the patient.  Evaluation and diagnostic testing in the emergency department does not suggest an emergent condition requiring admission or immediate intervention beyond what has been performed at this time.  They will follow up with Neurosurgery. We also discussed returning to the ED immediately if new or worsening sx occur. We discussed the sx which are most concerning (e.g., sudden worsening pain, fever, inability to tolerate by mouth) that necessitate immediate return. Medications administered to the patient during their visit and any new prescriptions provided to the patient are listed below.  Medications given during this visit Medications  acetaminophen (TYLENOL) tablet 1,000 mg (1,000 mg Oral Given 05/01/22 0949)  oxyCODONE (Oxy IR/ROXICODONE) immediate release tablet 5 mg (5 mg Oral Given 05/01/22 0949)     The patient appears reasonably screen and/or stabilized for discharge and I doubt any other medical condition or other [facility] requiring further screening, evaluation, or treatment in the ED at this time prior to discharge.           Final Clinical Impression(s) / ED Diagnoses Final diagnoses:  Neck pain, acute  Meningioma  ([place])    Rx / DC Orders ED Discharge Orders          Ordered    diclofenac Sodium (VOLTAREN) 1 % GEL  4 times daily        05/01/22 1042    morphine (MSIR) 15 MG tablet  Every 4 hours PRN        05/01/22 1042  Deno Etienne, DO 05/01/22 1048

## 2022-05-01 NOTE — ED Notes (Signed)
Patient transported to X-ray 

## 2022-05-04 ENCOUNTER — Ambulatory Visit: Payer: Medicare Other | Admitting: Cardiology

## 2022-05-10 ENCOUNTER — Ambulatory Visit (INDEPENDENT_AMBULATORY_CARE_PROVIDER_SITE_OTHER): Payer: Medicare Other

## 2022-05-10 DIAGNOSIS — Z23 Encounter for immunization: Secondary | ICD-10-CM | POA: Diagnosis not present

## 2022-05-10 DIAGNOSIS — M62830 Muscle spasm of back: Secondary | ICD-10-CM | POA: Diagnosis not present

## 2022-05-10 DIAGNOSIS — M75101 Unspecified rotator cuff tear or rupture of right shoulder, not specified as traumatic: Secondary | ICD-10-CM | POA: Diagnosis not present

## 2022-05-10 DIAGNOSIS — I255 Ischemic cardiomyopathy: Secondary | ICD-10-CM

## 2022-05-10 DIAGNOSIS — Z6830 Body mass index (BMI) 30.0-30.9, adult: Secondary | ICD-10-CM | POA: Diagnosis not present

## 2022-05-10 DIAGNOSIS — R269 Unspecified abnormalities of gait and mobility: Secondary | ICD-10-CM | POA: Diagnosis not present

## 2022-05-10 DIAGNOSIS — M12811 Other specific arthropathies, not elsewhere classified, right shoulder: Secondary | ICD-10-CM | POA: Diagnosis not present

## 2022-05-11 ENCOUNTER — Encounter: Payer: Self-pay | Admitting: Physical Therapy

## 2022-05-11 ENCOUNTER — Ambulatory Visit: Payer: Medicare Other | Attending: Family Medicine | Admitting: Physical Therapy

## 2022-05-11 DIAGNOSIS — Y92009 Unspecified place in unspecified non-institutional (private) residence as the place of occurrence of the external cause: Secondary | ICD-10-CM | POA: Diagnosis not present

## 2022-05-11 DIAGNOSIS — R278 Other lack of coordination: Secondary | ICD-10-CM | POA: Insufficient documentation

## 2022-05-11 DIAGNOSIS — D320 Benign neoplasm of cerebral meninges: Secondary | ICD-10-CM | POA: Diagnosis not present

## 2022-05-11 DIAGNOSIS — M6281 Muscle weakness (generalized): Secondary | ICD-10-CM | POA: Insufficient documentation

## 2022-05-11 DIAGNOSIS — W19XXXA Unspecified fall, initial encounter: Secondary | ICD-10-CM | POA: Diagnosis not present

## 2022-05-11 DIAGNOSIS — M62830 Muscle spasm of back: Secondary | ICD-10-CM | POA: Diagnosis not present

## 2022-05-11 DIAGNOSIS — R2681 Unsteadiness on feet: Secondary | ICD-10-CM | POA: Insufficient documentation

## 2022-05-11 DIAGNOSIS — M25511 Pain in right shoulder: Secondary | ICD-10-CM | POA: Diagnosis not present

## 2022-05-11 DIAGNOSIS — R29898 Other symptoms and signs involving the musculoskeletal system: Secondary | ICD-10-CM | POA: Diagnosis not present

## 2022-05-11 DIAGNOSIS — M75101 Unspecified rotator cuff tear or rupture of right shoulder, not specified as traumatic: Secondary | ICD-10-CM | POA: Diagnosis not present

## 2022-05-11 DIAGNOSIS — R279 Unspecified lack of coordination: Secondary | ICD-10-CM | POA: Insufficient documentation

## 2022-05-11 DIAGNOSIS — D329 Benign neoplasm of meninges, unspecified: Secondary | ICD-10-CM | POA: Diagnosis not present

## 2022-05-11 LAB — CUP PACEART REMOTE DEVICE CHECK
Battery Remaining Longevity: 111 mo
Battery Voltage: 3.04 V
Brady Statistic AP VP Percent: 10.15 %
Brady Statistic AP VS Percent: 0.14 %
Brady Statistic AS VP Percent: 86.29 %
Brady Statistic AS VS Percent: 3.43 %
Brady Statistic RA Percent Paced: 9.78 %
Brady Statistic RV Percent Paced: 27.88 %
Date Time Interrogation Session: 20230829044232
HighPow Impedance: 62 Ohm
Implantable Lead Implant Date: 20230222
Implantable Lead Implant Date: 20230222
Implantable Lead Implant Date: 20230222
Implantable Lead Location: 753858
Implantable Lead Location: 753859
Implantable Lead Location: 753860
Implantable Lead Model: 4798
Implantable Lead Model: 5076
Implantable Pulse Generator Implant Date: 20230222
Lead Channel Impedance Value: 214.783
Lead Channel Impedance Value: 214.783
Lead Channel Impedance Value: 218.298
Lead Channel Impedance Value: 247 Ohm
Lead Channel Impedance Value: 251.66 Ohm
Lead Channel Impedance Value: 380 Ohm
Lead Channel Impedance Value: 380 Ohm
Lead Channel Impedance Value: 494 Ohm
Lead Channel Impedance Value: 494 Ohm
Lead Channel Impedance Value: 513 Ohm
Lead Channel Impedance Value: 532 Ohm
Lead Channel Impedance Value: 589 Ohm
Lead Channel Impedance Value: 646 Ohm
Lead Channel Impedance Value: 722 Ohm
Lead Channel Impedance Value: 779 Ohm
Lead Channel Impedance Value: 817 Ohm
Lead Channel Impedance Value: 836 Ohm
Lead Channel Impedance Value: 912 Ohm
Lead Channel Pacing Threshold Amplitude: 0.5 V
Lead Channel Pacing Threshold Amplitude: 0.625 V
Lead Channel Pacing Threshold Amplitude: 1 V
Lead Channel Pacing Threshold Pulse Width: 0.4 ms
Lead Channel Pacing Threshold Pulse Width: 0.4 ms
Lead Channel Pacing Threshold Pulse Width: 0.4 ms
Lead Channel Sensing Intrinsic Amplitude: 1.25 mV
Lead Channel Sensing Intrinsic Amplitude: 1.25 mV
Lead Channel Sensing Intrinsic Amplitude: 14.625 mV
Lead Channel Sensing Intrinsic Amplitude: 14.625 mV
Lead Channel Setting Pacing Amplitude: 1.5 V
Lead Channel Setting Pacing Amplitude: 1.5 V
Lead Channel Setting Pacing Amplitude: 2 V
Lead Channel Setting Pacing Pulse Width: 0.4 ms
Lead Channel Setting Pacing Pulse Width: 0.4 ms
Lead Channel Setting Sensing Sensitivity: 0.3 mV

## 2022-05-11 NOTE — Therapy (Signed)
OUTPATIENT PHYSICAL THERAPY THORACOLUMBAR EVALUATION   Patient Name: Timothy Moran MRN: [id number] DOB:1945-09-18, 76 y.o., male Today's Date: 05/11/2022   PT End of Session - 05/11/22 1723     Visit Number 1    Date for PT Re-Evaluation 07/20/22    PT Start Time 2671    PT Stop Time 2458    PT Time Calculation (min) 37 min    Activity Tolerance Patient tolerated treatment well    Behavior During Therapy [facility] for tasks assessed/performed             Past Medical History:  Diagnosis Date   Acute blood loss anemia 10/05/2020   Acute respiratory failure ([place]), hypothermia therapy, vent - extubated 10/06/20    AKI (acute kidney injury) ([place]) 10/05/2020   Anoxic brain injury ([place]) 10/26/2020   Arrhythmia    Ascending aortic aneurysm ([place]) 01/29/2018   a. 2019 4.3 cm by echo; b. 02/2021 Echo: Ao root 61m.   Benign brain tumor ([place]    Benign neoplasm of brain ([place] 12/19/2017   Cardiac arrest with ventricular fibrillation ([place]    Chest pain 12/04/2017   Chronic kidney disease, stage III (moderate) ([place] 12/19/2017   CKD (chronic kidney disease), stage III - IV (HCC)    Coronary Artery Disease    a. 10/9983Inf STEMI complicated by cardiac arrest/PCI: LAD 50p/m, LCX 100d (2.5 x 30 Resolute Onyx DES); b. 04/2021 PCI: 04/2021 LM nl, LAD 50p/820m2.5x30 Onyx Frontier DES), LCX 25p, 5078mFR 0.98), 40d ISR (RFR 0.98), OM2 25, RCA mild diff dzs.   Diabetes mellitus, type 2 ([place]CC[place]1/25/2022   10/06/20 A1C 7%   Dizziness 10/11/2019   Encounter for imaging study to confirm orogastric (OG) tube placement    Hematuria 10/26/2020   HFrEF (heart failure with reduced ejection fraction) ([place]  a. 11/2020 Echo: EF 25-30%; b. 02/2021 Echo: EF 25-30%, glob HK. Mild LVH. Nl RV fxn. Triv AI. Ao root 67m62m History of pulmonary embolus (PE)    HLD (hyperlipidemia) 10/26/2020   Hypertension    Iron deficiency anemia rec'd IV iron 10/26/2020   Ischemic cardiomyopathy    a. 11/2020 Echo: EF  25-30%; b. 02/2021 Echo: EF 25-30%, glob HK.   Left bundle branch block 12/19/2017   Meningioma (HCC)[place]/11/2019   Nonsustained ventricular tachycardia (HCC)[place]/29/2021   Pain of left hip joint 05/29/2020   Personal history of pulmonary embolism 12/19/2017   Pneumonia of both lungs due to infectious organism    Pulmonary hypertension due to thromboembolism (HCC)[place]/28/2019   See echo 12/27/17 with nl PAS vs CTa chest 02/23/18    S/P angioplasty with stent 10/04/20 DES to LCX  10/26/2020   Sinus bradycardia 12/22/2017   SOB (shortness of breath)    Solitary pulmonary nodule on lung CT 03/08/2018   CT 12/04/17 1.0 x 0.8 x 0.7 cm nodular opacity in the RUL vs not seen 03/16/10 posterior segment of the right upper lobe. .Spi[place] Kitchenometry 03/08/2018    FEV1 3.86 (108%)  Ratio 96 s prior rx  - PET  03/13/18   Low grade c/w adenoca > rec T surgery eval     STEMI (ST elevation myocardial infarction) (HCC)[place]/23/2022   Urinary retention 10/26/2020   Vestibular schwannoma (HCC)[place]/11/2019   Past Surgical History:  Procedure Laterality Date   APPENDECTOMY     BIV ICD INSERTION CRT-D N/A 11/03/2021   Procedure: BIV ICD INSERTION CRT-D;  Surgeon: CamnConstance Haw;  [facility];  Service: Cardiovascular;  Laterality: N/A;   CARDIAC CATHETERIZATION     CORONARY ANGIOPLASTY WITH STENT PLACEMENT Left 04/12/2021   LAD stent placement   CORONARY STENT INTERVENTION N/A 04/12/2021   Procedure: CORONARY STENT INTERVENTION;  Surgeon: Nelva Bush, MD;  Location: [place] CV LAB;  Service: Cardiovascular;  Laterality: N/A;   CORONARY/GRAFT ACUTE MI REVASCULARIZATION N/A 10/04/2020   Procedure: Coronary/Graft Acute MI Revascularization;  Surgeon: Nelva Bush, MD;  Location: [place] CV LAB;  Service: Cardiovascular;  Laterality: N/A;   HERNIA REPAIR     INTRAVASCULAR PRESSURE WIRE/FFR STUDY N/A 04/12/2021   Procedure: INTRAVASCULAR PRESSURE WIRE/FFR STUDY;  Surgeon: Nelva Bush, MD;   Location: [place] CV LAB;  Service: Cardiovascular;  Laterality: N/A;   INTRAVASCULAR ULTRASOUND/IVUS N/A 04/12/2021   Procedure: Intravascular Ultrasound/IVUS;  Surgeon: Nelva Bush, MD;  Location: [place] CV LAB;  Service: Cardiovascular;  Laterality: N/A;   LEFT HEART CATH AND CORONARY ANGIOGRAPHY N/A 10/04/2020   Procedure: LEFT HEART CATH AND CORONARY ANGIOGRAPHY;  Surgeon: Nelva Bush, MD;  Location: [place] CV LAB;  Service: Cardiovascular;  Laterality: N/A;   RIGHT/LEFT HEART CATH AND CORONARY ANGIOGRAPHY N/A 04/12/2021   Procedure: RIGHT/LEFT HEART CATH AND CORONARY ANGIOGRAPHY;  Surgeon: Nelva Bush, MD;  Location: [place] CV LAB;  Service: Cardiovascular;  Laterality: N/A;   Patient Active Problem List   Diagnosis Date Noted   Anemia    Leukocytosis    Right arm weakness 01/25/2022   Right sided weakness    Fall at home 01/22/2022   Status post biventricular pacemaker - MRI compatible PPM and leads 01/22/2022   Chronic indwelling Foley catheter 01/22/2022   CKD (chronic kidney disease), stage III - IV ([place]) 06/28/2021   Chronic HFrEF (heart failure with reduced ejection fraction) ([place]) 03/31/2021   Arrhythmia    DM (diabetes mellitus), type 2 ([place]) new 10/26/2020   Urinary retention 10/26/2020   Hyperlipidemia LDL goal <70 10/26/2020   Coronary artery disease of native artery of native heart with stable angina pectoris ([place]) 10/26/2020   S/P angioplasty with stent 10/04/20 DES to LCX  10/26/2020   Ischemic cardiomyopathy 10/26/2020   Diabetes mellitus, type 2 ([place]) 10/06/2020   DOE (dyspnea on exertion)    Meningioma ([place]) 08/14/2020   Vestibular schwannoma ([place]) 08/14/2020   Cardiac arrest with ventricular fibrillation (HCC)    Pain of left hip joint 05/29/2020   Benign brain tumor ([place])    Chronic kidney disease    History of pulmonary embolus (PE)    Hypertension    Nonsustained ventricular tachycardia ([place]) 10/11/2019   Dizziness  10/11/2019   Abnormal echocardiogram 06/07/2019   Pulmonary hypertension due to thromboembolism ([place]) 03/09/2018   Solitary pulmonary nodule on lung CT 03/08/2018   Ascending aortic aneurysm (HCC) 4.2 cm based on CT from December 2020 01/29/2018   Sinus bradycardia 12/22/2017   Left bundle branch block 12/19/2017   Personal history of pulmonary embolism - no longer on anticoagulation due to pt refusal to take it anymore. 12/19/2017   Chronic kidney disease, stage III (moderate) ([place]) 12/19/2017   Benign neoplasm of brain ([place]) 12/19/2017    PCP: Cari Caraway, MD   REFERRING PROVIDER: Cari Caraway, MD   REFERRING DIAG: Diagnosis [phone] (ICD-10-CM) - Muscle spasm of back M75.101 (ICD-10-CM) - Unspecified rotator cuff tear or rupture of right shoulder, not specified as traumatic   Rationale for Evaluation and Treatment Rehabilitation  THERAPY DIAG:  Right shoulder pain, unspecified chronicity  Right arm weakness  Poor body mechanics  Fall in home, initial encounter  Unsteadiness on feet  Other lack of coordination  Meningioma (HCC)  Muscle weakness (generalized)  ONSET DATE: 05/10/2022   SUBJECTIVE:                                                                                                                                                                                           SUBJECTIVE STATEMENT: Patient was recently seen for PT after a fall in May, with decreased balance and R Rot Cuff tear. He was D/C'd from PT at the end of July. He had another fall 2 weeks ago, striking his head, and R side. He is referred for R rot cuff injury as he is now moving his R shoulder less, and LBP. PERTINENT HISTORY:  Brief HPI:   Harun Brumley is a 76 y.o. male who fell backward striking his head and right neck shoulder area on 01/21/2022. He complained of right hemiparesis. CT head revealed left parafalcine meningioma.Started on Decadron with improvement in symptoms   PAIN:   Are you having pain? Yes: NPRS scale: 4/10 Pain location: B low back, no radiating Pain description: sharp, grabbing Aggravating factors: movement Relieving factors: sitting still,    PRECAUTIONS: None  WEIGHT BEARING RESTRICTIONS No  FALLS:  Has patient fallen in last 6 months? Yes. Number of falls 2  LIVING ENVIRONMENT: Lives with: lives with their spouse Lives in: House/apartment Stairs: Yes: External: 7 steps; bilateral but cannot reach both Has following equipment at home: Gilford Rile - 2 wheeled  OCCUPATION: Retired  PLOF: Independent  PATIENT GOALS Feel normal with standing and functioning.   OBJECTIVE:   DIAGNOSTIC FINDINGS:  CT Lumbar- 3. Moderate degenerative spinal stenosis at L2-3. CT CERVICAL IMPRESSION: 1. No evidence of intracranial injury. 2. Negative for cervical spine fracture but there is prevertebral edema at C4-5 suggesting soft tissue injury. 3. Meningiomas with left cerebral of edema, reference brain MRI 01/22/2022.  PATIENT SURVEYS:  FOTO 63.1  SCREENING FOR RED FLAGS: Bowel or bladder incontinence: No Spinal tumors: No Cauda equina syndrome: No Compression fracture: No Abdominal aneurysm: Yes: Abdominal aortic aneurysm, stable  COGNITION:  Overall cognitive status: Within functional limits for tasks assessed and Patient appeared slightly more confused about the details of his injury at this time compared to his last visit.      SENSATION: WFL Patient reports tingling in B hands, all over.  MUSCLE LENGTH: Hamstrings: approximately 70 degrees B.  POSTURE: rounded shoulders and forward head  PALPATION: No TTP in R shoulder or up traps. B up traps tight, low back TTP R and L  proximal to Illiac crest  LUMBAR ROM:  AROM limited in all directions, but he lost his balance frequently, so accuracy of testing limited.   LOWER EXTREMITY ROM:      General mild stiffness throughout BLE with PROM.  LOWER EXTREMITY MMT:  TBA  MMT  Right eval Left eval  Hip flexion    Hip extension    Hip abduction    Hip adduction    Hip internal rotation    Hip external rotation    Knee flexion    Knee extension    Ankle dorsiflexion    Ankle plantarflexion    Ankle inversion    Ankle eversion     (Blank rows = not tested)  LUMBAR SPECIAL TESTS:  Slump test: Positive R  Shoulder Special Tests empty can test (+) R, Hawkins-Kennedy test (+) R. Previous MRI reveals full tear of supraspinatus and partial tear of infraspinatus.  FUNCTIONAL TESTS:  5 times sit to stand: TBD Timed up and go (TUG): TBD Functional gait assessment: TBD  GAIT: Distance walked: 80 Assistive device utilized: Walker - 2 wheeled Level of assistance: SBA Comments: Patient was unsteady with turns, relied on UE support for balance.    TODAY'S TREATMENT  Ed   PATIENT EDUCATION:  Education details: POC, further assessment on next visit Person educated: Patient and Spouse Education method: Explanation Education comprehension: verbalized understanding   HOME EXERCISE PROGRAM: [id number]-previous-need to re-assess  ASSESSMENT:  CLINICAL IMPRESSION: Patient is a 75 y.o. who was seen today for physical therapy evaluation and treatment for R shoulder pain and weakness, low back pan, poor balance, all after a fall at home. Testing confirms the shoulder limitations. He will require further assessment for his balance and back pain to identify more specific deficits. He will benefit from treatment to address his pain, weakness, and balance.   OBJECTIVE IMPAIRMENTS Abnormal gait, decreased activity tolerance, decreased balance, decreased cognition, decreased coordination, difficulty walking, decreased ROM, decreased strength, increased muscle spasms, impaired flexibility, postural dysfunction, and pain.   ACTIVITY LIMITATIONS carrying, lifting, bending, standing, squatting, stairs, reach over head, and locomotion level  PARTICIPATION LIMITATIONS:  cleaning, laundry, driving, shopping, and community activity  PERSONAL FACTORS Age and 1 comorbidity: multiple meningiomas  are also affecting patient's functional outcome.   REHAB POTENTIAL: Good  CLINICAL DECISION MAKING: Evolving/moderate complexity  EVALUATION COMPLEXITY: Moderate   GOALS: Goals reviewed with patient? Yes  SHORT TERM GOALS: Target date: 06/08/2022  I with initial HEP Baseline: Goal status: INITIAL  LONG TERM GOALS: Target date: 07/20/2022  I with final HEP Baseline:  Goal status: INITIAL  2.  Patient will achieve 120 degrees of active shoulder elevation with pain < 3/10 Baseline: 103, pain 4/10  Goal status: INITIAL  3.  Complete 5 x STS in <12 sec to demonstrate improved LE strength Baseline: TBD Goal status: INITIAL  4.  Patient will complete TUG in < 12 sec with or without AD Baseline: TBD Goal status: INITIAL  5.  Patient will score at least 18/24 on FGA (Cannot attempt heel/toe gait) Baseline: TBD Goal status: INITIAL  6.  Patient will walk at least 600' with LRAD, MI on level an unlevel surfaces, back pain < 3/10 Baseline: 80', unsteady, RW Goal status: INITIAL   PLAN: PT FREQUENCY: 2x/week  PT DURATION: 10 weeks  PLANNED INTERVENTIONS: Therapeutic exercises, Therapeutic activity, Neuromuscular re-education, Balance training, Gait training, Patient/Family education, Self Care, Joint mobilization, Stair training, Dry Needling, Electrical stimulation, Cryotherapy, Moist heat, Ultrasound, Ionotophoresis '4mg'$ /ml Dexamethasone, and  Manual therapy.  PLAN FOR NEXT SESSION: Complete TUG, 5X STS, FGA, LE strength assessment, review HEP/update as appropriate.   Marcelina Morel, DPT 05/11/2022, 6:54 PM

## 2022-05-16 NOTE — Progress Notes (Signed)
Cardiology Office Note Date:  05/16/2022  Patient ID:  Timothy Moran, DOB 1946/06/11, MRN [id number] PCP:  Cari Caraway, MD  Cardiologist:  [name] Electrophysiologist: [name]    Chief Complaint: planned f/u  History of Present Illness: [name] is a 76 y.o. male with history of CAD (STEMI associated with VF arrest, PCI Aug 2022 with intervention/PCI  mid LAD), ICM, chronic CHF (systolic), LBBB, ICD, HTN, HLD, ascending Ao aneurysm, NSVT, CKD (III), DM meningioma s/p gamma knife radiation  He was hospitalized 01/22/2022 - 01/25/22  sided weakness following a fall at home. MRI of the brain as well as C-spine without acute findings, but showed his known meningiomas with slightly worsening surrounding edema.   Neurology and neurosurgery were consulted.  He was treated with IV steroids and subsequent oral steroid taper.  Cardiology was consulted due to possible atrial fibrillation, however, EKG and telemetry showed sinus rhythm with PACs andnot formally seen.   Discharged to Inpatient medicine and rehab due to physical deconditioning, right hemiparalysis.   Subsequent MRI of the shoulder showed right rotator cuff tear.   ARB was discontinued in the setting of acute renal failure.    He saw [name], NP 02/22/22 for pre-op evaluation prior to a urology procedure, no cardiac/anginal sounding symptoms.  Felt to be an acceptable risk for the planned procedure.  He saw [name] 02/28/22, discussed a trip/fall accident that required rehab and was recovering.  Also deemed an acceptable risk for his urological procedure. No changes made  03/16/22 there is an in clinic device check with OptiVol way up but coming down, 83% BP and effective BP% Noting frequent PACs falling in refractory causing AS/VS , felt to be causing his reduction in BP% and his PVARP changed from auto to 290m in effort to sense these and track them  Pt called 04/05/22 with c/o palpitations, normal device  remote reported, Dr. CCurt Bearsreviewed and noted PACs recommended OV w/EP APP.   He called [name]7/27/23 with SOB, reported a couple weeks worth, tired, low BPs  Planned for labs, Dr. KOrpah Clintonreport note comments on worsening kidney function and elevated K+ with evidence of HF, planned to repeat labs  Called device our office again, 04/11/22, wanted to get his ICD checked, programmed, 2/2 , advised to keep his scheduled appt.  04/08/22  K+ 5.3 BUN/Creat 17/2.23 BNP 3220  04/14/22  K+ 4.7 BUN/Creat 18/2.06  Baseline Creat looks best 1.8, mostly in the low 2's...   I saw him 04/19/22 He is accompanied by his wife. Reports an awareness of strong/irregular heartbeats, not clearly associated with symptoms. Fatigue seems to be new for about 2 mo, tired all of the time, no energy Not lightheaded, not fainting But easily winded and tired. Wishes he had more energy, tired. He stopped his ASA some time ago 2/2 nose bleeds that were significant and hard to stop No CP (He was maintained on ASA after his hospitalization with the meningioma and he comfirms he has never been asked to stop it because of that) Pending further management of his meningioma, sounds like with radiation tx, not yet scheduled, [facility]/BP, but also having PVCs probably what is reducing his BP% (was 90%), and his Toprol increased to try and suppress them if able QRS was 1597m and could consider optimization if needed as well. Advised he resume ASA, he was reluctant, and scheduled to see Dr. KrAgustin Creeoon and suggested he discuss  further with him.   ER visit 05/01/22 after a fall resulting in neck pain, neurosurgery reviewed images (no fractures), recommended soft collar   Saw [name] 05/17/22 feeling better, couple falls with orthopedic pains, no cardiac complaints or concerns.  No changes were made.  TODAY He is accompanied by his wife He did not tolerate the  Toprol '100mg'$  dosing, making him even more tired, napped a lot. Worse energy. Back on '50mg'$  dosing he feels better. They both report the fall leading to the ER was a rip/fall accident.  He had no syncope, not associated with dizziness. He did after that a couple days feel a bit confused or disoriented, though that cleared up. No headaches or focal deficits then or now  No CP, palpitations or cardiac complaints Denies SOB They are both pretty happy with how he is doing cardiac-wise, not inclined to want to make any further changes.  Outside of body aches/pains, feels pretty good    Device information MDT CRT-D implanted 11/03/2021 Has CS lead  Past Medical History:  Diagnosis Date   Acute blood loss anemia 10/05/2020   Acute respiratory failure ([place]), hypothermia therapy, vent - extubated 10/06/20    AKI (acute kidney injury) ([place]) 10/05/2020   Anoxic brain injury ([place]) 10/26/2020   Arrhythmia    Ascending aortic aneurysm ([place]) 01/29/2018   a. 2019 4.3 cm by echo; b. 02/2021 Echo: Ao root 6m.   [place]ign brain tumor ([place]    [place]ign [place] of brain ([place] 12/19/2017   Cardiac arrest with ventricular fibrillation ([facility]    Chest pain 12/04/2017   Chronic kidney disease, stage III (moderate) ([place] 12/19/2017   CKD (chronic kidney disease), stage III - IV (HCC)    Coronary Artery Disease    a. 11/6109Inf STEMI complicated by cardiac arrest/PCI: LAD 50p/m, LCX 100d (2.5 x 30 Resolute Onyx DES); b. 04/2021 PCI: 04/2021 LM nl, LAD 50p/886m2.5x30 Onyx Frontier DES), LCX 25p, 5085mFR 0.98), 40d ISR (RFR 0.98), OM2 25, RCA mild diff dzs.   Diabetes mellitus, type 2 ([place]/25/2022   10/06/20 A1C 7%   Dizziness 10/11/2019   Encounter for imaging study to confirm orogastric (OG) tube placement    Hematuria 10/26/2020   HFrEF (heart failure with reduced ejection fraction) ([place]  a. 11/2020 Echo: EF 25-30%; b. 02/2021 Echo: EF 25-30%, glob HK. Mild LVH. Nl RV fxn. Triv AI. Ao root 41m86m  History of pulmonary embolus (PE)    HLD (hyperlipidemia) 10/26/2020   Hypertension    Iron deficiency anemia rec'd IV iron 10/26/2020   Ischemic cardiomyopathy    a. 11/2020 Echo: EF 25-30%; b. 02/2021 Echo: EF 25-30%, glob HK.   Left bundle branch block 12/19/2017   Meningioma (HCC)[place]/11/2019   Nonsustained ventricular tachycardia (HCC)[place]/29/2021   Pain of left hip joint 05/29/2020   Personal history of pulmonary embolism 12/19/2017   Pneumonia of both lungs due to infectious organism    Pulmonary hypertension due to thromboembolism (HCC)[place]/28/2019   See echo 12/27/17 with nl PAS vs CTa chest 02/23/18    S/P angioplasty with stent 10/04/20 DES to LCX  10/26/2020   Sinus bradycardia 12/22/2017   SOB (shortness of breath)    Solitary pulmonary nodule on lung CT 03/08/2018   CT 12/04/17 1.0 x 0.8 x 0.7 cm nodular opacity in the RUL vs not seen 03/16/10 posterior segment of the right upper lobe. .Spi[place]land Kitchenometry 03/08/2018    FEV1 3.86 (108%)  Ratio 96  s prior rx  - PET  03/13/18   Low grade c/w adenoca > rec T surgery eval     STEMI (ST elevation myocardial infarction) ([place]) 10/04/2020   Urinary retention 10/26/2020   Vestibular schwannoma ([place]) 08/14/2020    Past Surgical History:  Procedure Laterality Date   APPENDECTOMY     BIV ICD INSERTION CRT-D N/A 11/03/2021   Procedure: BIV ICD INSERTION CRT-D;  Surgeon: Constance Haw, MD;  Location: [place] [facility];  Service: Cardiovascular;  Laterality: N/A;   CARDIAC CATHETERIZATION     CORONARY ANGIOPLASTY WITH STENT PLACEMENT Left 04/12/2021   LAD stent placement   CORONARY STENT INTERVENTION N/A 04/12/2021   Procedure: CORONARY STENT INTERVENTION;  Surgeon: Nelva Bush, MD;  Location: [facility];  Service: Cardiovascular;  Laterality: N/A;   CORONARY/GRAFT ACUTE MI REVASCULARIZATION N/A 10/04/2020   Procedure: Coronary/Graft Acute MI Revascularization;  Surgeon: Nelva Bush, MD;  Location: [facility];  Service:  Cardiovascular;  Laterality: N/A;   HERNIA REPAIR     INTRAVASCULAR PRESSURE WIRE/FFR STUDY N/A 04/12/2021   Procedure: INTRAVASCULAR PRESSURE WIRE/FFR STUDY;  Surgeon: Nelva Bush, MD;  Location: [place] [facility];  Service: Cardiovascular;  Laterality: N/A;   INTRAVASCULAR ULTRASOUND/IVUS N/A 04/12/2021   Procedure: Intravascular Ultrasound/IVUS;  Surgeon: Nelva Bush, MD;  Location: [facility];  Service: Cardiovascular;  Laterality: N/A;   LEFT HEART CATH AND CORONARY ANGIOGRAPHY N/A 10/04/2020   Procedure: LEFT HEART CATH AND CORONARY ANGIOGRAPHY;  Surgeon: Nelva Bush, MD;  Location: [facility];  Service: Cardiovascular;  Laterality: N/A;   RIGHT/LEFT HEART CATH AND CORONARY ANGIOGRAPHY N/A 04/12/2021   Procedure: RIGHT/LEFT HEART CATH AND CORONARY ANGIOGRAPHY;  Surgeon: Nelva Bush, MD;  Location: [facility];  Service: Cardiovascular;  Laterality: N/A;    Current Outpatient Medications  Medication Sig Dispense Refill   acetaminophen (TYLENOL) 325 MG tablet Take 1-2 tablets (325-650 mg total) by mouth every 4 (four) hours as needed for mild pain.     aspirin EC 81 MG tablet Take 81 mg by mouth every other day. Swallow whole.     atorvastatin (LIPITOR) 80 MG tablet Take 1 tablet (80 mg total) by mouth daily. 90 tablet 3   B Complex-C (SUPER B COMPLEX PO) Take 1 tablet by mouth daily. Unknown strength     diclofenac Sodium (VOLTAREN) 1 % GEL Apply 4 g topically 4 (four) times daily. 100 g 0   fluticasone (FLONASE) 50 MCG/ACT nasal spray Place 1-2 sprays into both nostrils at bedtime.  2   hydrALAZINE (APRESOLINE) 10 MG tablet Take 1 tablet (10 mg total) by mouth 3 (three) times daily. 270 tablet 3   isosorbide dinitrate (ISORDIL) 30 MG tablet Take 1 tablet (30 mg total) by mouth 2 (two) times daily. 180 tablet 2   melatonin 5 MG TABS Take 1 tablet (5 mg total) by mouth at bedtime. 30 tablet 0   metoprolol succinate (TOPROL-XL) 50 MG 24 hr tablet Take 1  tablet (50 mg total) by mouth daily. Take with or immediately following a meal. 90 tablet 3   morphine (MSIR) 15 MG tablet Take 0.5 tablets (7.5 mg total) by mouth every 4 (four) hours as needed for severe pain. 5 tablet 0   Multiple Vitamin (MULTIVITAMIN WITH MINERALS) TABS tablet Take 1 tablet by mouth daily.     senna-docusate (SENOKOT-S) 8.6-50 MG tablet Take 1 tablet by mouth at bedtime as needed for mild constipation.  No current facility-administered medications for this visit.    Allergies:   Patient has no known allergies.   Social History:  The patient  reports that he quit smoking about 53 years ago. His smoking use included cigarettes. He has a 2.50 pack-year smoking history. He has never used smokeless tobacco. He reports current alcohol use. He reports that he does not use drugs.   Family History:  The patient's family history includes Brain cancer in his sister; Diabetes in his mother; Leukemia in his brother; Melanoma in his brother.  ROS:  Please see the history of present illness.    All other systems are reviewed and otherwise negative.   PHYSICAL EXAM:  VS:  There were no vitals taken for this visit. BMI: There is no height or weight on file to calculate BMI. Well nourished, well developed, in no acute distress HEENT: normocephalic, atraumatic Neck: no JVD, carotid bruits or masses Cardiac:   RRR; a couple extrasystoles, no significant murmurs, no rubs, or gallops Lungs:  CTA b/l, no wheezing, rhonchi or rales Abd: soft, nontender MS: no deformity or atrophy Ext: no edema Skin: warm and dry, no rash Neuro:  No gross deficits appreciated Psych: euthymic mood, full affect  ICD site is stable, no tethering or discomfort   EKG:  not done today  Device interrogation done today and reviewed by myself:  Battery and lead measurements are good No arrhythmias     LHC 04/12/21  Review of the above records today demonstrates:  Severe single-vessel coronary artery  disease with multifocal disease of the proximal and mid LAD of up to 80% that is hemodynamically significant by RFR and IVUS criteria. Moderate LCx disease with 50% de novo stenosis proximal to stent placed in 09/2020.  There is also 40% in-stent restenosis in the LCx/OM2 stent.  These lesions are not hemodynamically significant (RFR = 0.98). Mildly elevated left heart, right heart, and pulmonary artery pressures. Normal Fick cardiac output/index. Successful IVUS- and RFR-guided PCI to 80% mid LAD stenosis using Onyx Frontier 2.5 x 30 mm drug-eluting stent (postdilated to 3.2 mm) with 0% residual stenosis and TIMI-3 flow.   TTE 07/16/21  1. Left ventricular ejection fraction, by estimation, is 30 to 35%. The  left ventricle has moderately decreased function. The left ventricle has  no regional wall motion abnormalities. Left ventricular diastolic  parameters are indeterminate.   2. Right ventricular systolic function is normal. The right ventricular  size is normal.   3. The mitral valve is normal in structure. No evidence of mitral valve  regurgitation. No evidence of mitral stenosis.   4. The aortic valve is normal in structure. Aortic valve regurgitation is  trivial. No aortic stenosis is present.   5. Aneurysm of the ascending aorta, measuring 44 mm.   6. The inferior vena cava is normal in size with greater than 50%  respiratory variability, suggesting right atrial pressure of 3 mmHg.   Recent Labs: 01/25/2022: Magnesium 2.0 04/08/2022: NT-Pro BNP 3,220 05/01/2022: ALT 15; BUN 19; Creatinine, Ser 2.00; Hemoglobin 10.8; Platelets 278; Potassium 4.3; Sodium 136  No results found for requested labs within last 365 days.   CrCl cannot be calculated (Unknown ideal weight.).   Wt Readings from Last 3 Encounters:  04/19/22 219 lb 9.6 oz (99.6 kg)  02/28/22 223 lb (101.2 kg)  02/22/22 219 lb 9.6 oz (99.6 kg)     Other studies reviewed: Additional studies/records reviewed today include:  summarized above  ASSESSMENT AND PLAN:  ICD  Intact function No programming changes made today  I did pace at an increased rate today to see if it would suppress his PVCs but did not. PACs ar being seen and tracked. Reduction in BP% is 2/2 PVCs He did not tolerate increased BB dose with marked fatigue  We can consider AAD if needed, in d/w them today, no changes at this time  CAD No anginal symptoms C/w [name]   ICM Chronic CHF (systolic) BP and effective BP is 89% PVCs Optivol looks good No symptoms or exam findings of volume OL  Discussed above, no changes at this time, consider AAD if needed They will c/w [name] as scheduled.   I will d/w [name], otherwise plan EP visit in a year, sooner if needed.   Disposition: remotes as usual and as above   Current medicines are reviewed at length with the patient today.  The patient did not have any concerns regarding medicines.  Venetia Night, PA-C 05/16/2022 9:08 AM     [address] [place] [address] [phone] (office)  [phone] (fax)

## 2022-05-17 ENCOUNTER — Ambulatory Visit: Payer: Medicare Other | Attending: Cardiology | Admitting: Cardiology

## 2022-05-17 ENCOUNTER — Encounter: Payer: Self-pay | Admitting: Cardiology

## 2022-05-17 VITALS — BP 122/62 | HR 72 | Ht 73.0 in | Wt 217.0 lb

## 2022-05-17 DIAGNOSIS — I2699 Other pulmonary embolism without acute cor pulmonale: Secondary | ICD-10-CM | POA: Diagnosis not present

## 2022-05-17 DIAGNOSIS — I447 Left bundle-branch block, unspecified: Secondary | ICD-10-CM | POA: Diagnosis not present

## 2022-05-17 DIAGNOSIS — E785 Hyperlipidemia, unspecified: Secondary | ICD-10-CM | POA: Diagnosis not present

## 2022-05-17 DIAGNOSIS — R0609 Other forms of dyspnea: Secondary | ICD-10-CM | POA: Diagnosis not present

## 2022-05-17 DIAGNOSIS — Z95820 Peripheral vascular angioplasty status with implants and grafts: Secondary | ICD-10-CM | POA: Insufficient documentation

## 2022-05-17 DIAGNOSIS — I5022 Chronic systolic (congestive) heart failure: Secondary | ICD-10-CM | POA: Insufficient documentation

## 2022-05-17 DIAGNOSIS — I2729 Other secondary pulmonary hypertension: Secondary | ICD-10-CM | POA: Insufficient documentation

## 2022-05-17 NOTE — Patient Instructions (Signed)
Medication Instructions:  Your physician recommends that you continue on your current medications as directed. Please refer to the Current Medication list given to you today.  *If you need a refill on your cardiac medications before your next appointment, please call your pharmacy*   Lab Work: None Ordered If you have labs (blood work) drawn today and your tests are completely normal, you will receive your results only by: MyChart Message (if you have MyChart) OR A paper copy in the mail If you have any lab test that is abnormal or we need to change your treatment, we will call you to review the results.   Testing/Procedures: Your physician has requested that you have an echocardiogram. Echocardiography is a painless test that uses sound waves to create images of your heart. It provides your doctor with information about the size and shape of your heart and how well your heart's chambers and valves are working. This procedure takes approximately one hour. There are no restrictions for this procedure.    Follow-Up: At [facility], you and your health needs are our priority.  As part of our continuing mission to provide you with exceptional heart care, we have created designated Provider Care Teams.  These Care Teams include your primary Cardiologist (physician) and Advanced Practice Providers (APPs -  Physician Assistants and Nurse Practitioners) who all work together to provide you with the care you need, when you need it.  We recommend signing up for the patient portal called "MyChart".  Sign up information is provided on this After Visit Summary.  MyChart is used to connect with patients for Virtual Visits (Telemedicine).  Patients are able to view lab/test results, encounter notes, upcoming appointments, etc.  Non-urgent messages can be sent to your provider as well.   To learn more about what you can do with MyChart, go to [email or web address].    Your next appointment:   4  month(s)  The format for your next appointment:   In Person  Provider:   Robert Krasowski, MD    Other Instructions NA  

## 2022-05-17 NOTE — Progress Notes (Signed)
Cardiology Office Note:    Date:  05/17/2022   ID:  Timothy Moran, DOB [birth year], MRN [id number]  PCP:  Cari Caraway, MD  Cardiologist:  Jenne Campus, MD    Referring MD: Cari Caraway, MD     History of Present Illness:    Timothy Moran is a 76 y.o. male     with incredibly complex past medical history.  Initially he was seen in my office because of history of pulmonary emboli he did have mild pulmonary hypertension but overall was doing quite well.  However at the end of January he was shoveling the snow started having chest pain 1 back and collapse he was found to be in V. fib he was defibrillated brought to [facility] he was found to have acute STEMI involving circumflex artery that was addressed with PTCA and drug-eluting stent.  He does have residual 80% stenosis of the mid LAD he ended up having diminished ejection fraction with level of 25%.  He did wear LifeVest however absolutely hated this and does not want to wear it anymore.  He comes today to my office complaining of having some fatigue tiredness as well as dizziness" upon getting up. In August he did have PTCA and stenting of the LAD lesion he tolerated quite well and since that time he is doing much better.  He said he got much more energy.  He does not get dizzy anymore.  Since I have seen him last time he did have defibrillator implanted and he reports that she is feeling much better.  However he does have a probably balance he end up falling twice being in the hospital twice likely no broken bones no serious injury.  He is participating in physical therapy overall he said he is doing fine complain however of having multiple pains in multiple joints.  No chest pain tightness squeezing pressure burning chest.  She still some shortness of breath with exertion  Past Medical History:  Diagnosis Date   Acute blood loss anemia 10/05/2020   Acute respiratory failure ([place]), hypothermia therapy, vent - extubated 10/06/20     AKI (acute kidney injury) ([place]) 10/05/2020   Anoxic brain injury ([place]) 10/26/2020   Arrhythmia    Ascending aortic aneurysm ([place]) 01/29/2018   a. 2019 4.3 cm by echo; b. 02/2021 Echo: Ao root 25m.   Benign brain tumor (HUtting    Benign neoplasm of brain ([place] 12/19/2017   Cardiac arrest with ventricular fibrillation ([place]    Chest pain 12/04/2017   Chronic kidney disease, stage III (moderate) (HCrisp 12/19/2017   CKD (chronic kidney disease), stage III - IV (HCC)    Coronary Artery Disease    a. 13/3007Inf STEMI complicated by cardiac arrest/PCI: LAD 50p/m, LCX 100d (2.5 x 30 Resolute Onyx DES); b. 04/2021 PCI: 04/2021 LM nl, LAD 50p/813m2.5x30 Onyx Frontier DES), LCX 25p, 5058mFR 0.98), 40d ISR (RFR 0.98), OM2 25, RCA mild diff dzs.   Diabetes mellitus, type 2 (HCC[place]1/25/2022   10/06/20 A1C 7%   Dizziness 10/11/2019   Encounter for imaging study to confirm orogastric (OG) tube placement    Hematuria 10/26/2020   HFrEF (heart failure with reduced ejection fraction) ([place]CC[place]  a. 11/2020 Echo: EF 25-30%; b. 02/2021 Echo: EF 25-30%, glob HK. Mild LVH. Nl RV fxn. Triv AI. Ao root 6m58m History of pulmonary embolus (PE)    HLD (hyperlipidemia) 10/26/2020   Hypertension    Iron deficiency anemia rec'd IV iron 10/26/2020  Ischemic cardiomyopathy    a. 11/2020 Echo: EF 25-30%; b. 02/2021 Echo: EF 25-30%, glob HK.   Left bundle branch block 12/19/2017   Meningioma ([place]) 08/14/2020   Nonsustained ventricular tachycardia ([place]) 10/11/2019   Pain of left hip joint 05/29/2020   Personal history of pulmonary embolism 12/19/2017   Pneumonia of both lungs due to infectious organism    Pulmonary hypertension due to thromboembolism ([place]) 03/09/2018   See echo 12/27/17 with nl PAS vs CTa chest 02/23/18    S/P angioplasty with stent 10/04/20 DES to LCX  10/26/2020   Sinus bradycardia 12/22/2017   SOB (shortness of breath)    Solitary pulmonary nodule on lung CT 03/08/2018   CT 12/04/17 1.0 x 0.8 x 0.7 cm  nodular opacity in the RUL vs not seen 03/16/10 posterior segment of the right upper lobe. Marland KitchenSpirometry 03/08/2018    FEV1 3.86 (108%)  Ratio 96 s prior rx  - PET  03/13/18   Low grade c/w adenoca > rec T surgery eval     STEMI (ST elevation myocardial infarction) ([place]) 10/04/2020   Urinary retention 10/26/2020   Vestibular schwannoma ([place]) 08/14/2020    Past Surgical History:  Procedure Laterality Date   APPENDECTOMY     BIV ICD INSERTION CRT-D N/A 11/03/2021   Procedure: BIV ICD INSERTION CRT-D;  Surgeon: Constance Haw, MD;  Location: [place] CV LAB;  Service: Cardiovascular;  Laterality: N/A;   CARDIAC CATHETERIZATION     CORONARY ANGIOPLASTY WITH STENT PLACEMENT Left 04/12/2021   LAD stent placement   CORONARY STENT INTERVENTION N/A 04/12/2021   Procedure: CORONARY STENT INTERVENTION;  Surgeon: Nelva Bush, MD;  Location: [place] CV LAB;  Service: Cardiovascular;  Laterality: N/A;   CORONARY/GRAFT ACUTE MI REVASCULARIZATION N/A 10/04/2020   Procedure: Coronary/Graft Acute MI Revascularization;  Surgeon: Nelva Bush, MD;  Location: [place] CV LAB;  Service: Cardiovascular;  Laterality: N/A;   HERNIA REPAIR     INTRAVASCULAR PRESSURE WIRE/FFR STUDY N/A 04/12/2021   Procedure: INTRAVASCULAR PRESSURE WIRE/FFR STUDY;  Surgeon: Nelva Bush, MD;  Location: [place] CV LAB;  Service: Cardiovascular;  Laterality: N/A;   INTRAVASCULAR ULTRASOUND/IVUS N/A 04/12/2021   Procedure: Intravascular Ultrasound/IVUS;  Surgeon: Nelva Bush, MD;  Location: [place] CV LAB;  Service: Cardiovascular;  Laterality: N/A;   LEFT HEART CATH AND CORONARY ANGIOGRAPHY N/A 10/04/2020   Procedure: LEFT HEART CATH AND CORONARY ANGIOGRAPHY;  Surgeon: Nelva Bush, MD;  Location: [place] CV LAB;  Service: Cardiovascular;  Laterality: N/A;   RIGHT/LEFT HEART CATH AND CORONARY ANGIOGRAPHY N/A 04/12/2021   Procedure: RIGHT/LEFT HEART CATH AND CORONARY ANGIOGRAPHY;  Surgeon: Nelva Bush, MD;  Location: [place] CV LAB;  Service: Cardiovascular;  Laterality: N/A;    Current Medications: No outpatient medications have been marked as taking for the 05/17/22 encounter (Office Visit) with Park Liter, MD.     Allergies:   Patient has no known allergies.   Social History   Socioeconomic History   Marital status: Married    Spouse name: Selso Mannor    Number of children: 1   Years of education: 16   Highest education level: Bachelor's degree (e.g., BA, AB, BS)  Occupational History   Not on file  Tobacco Use   Smoking status: Former    Packs/day: 0.50    Years: 5.00    Total pack years: 2.50    Types: Cigarettes    Quit date: 09/12/1968    Years since quitting: 53.7   Smokeless  tobacco: Never  Vaping Use   Vaping Use: Never used  Substance and Sexual Activity   Alcohol use: Yes    Comment: ONE PER WEEK   Drug use: Never   Sexual activity: Not Currently  Other Topics Concern   Not on file  Social History Narrative   Not on file   Social Determinants of Health   Financial Resource Strain: Not on file  Food Insecurity: Not on file  Transportation Needs: Not on file  Physical Activity: Not on file  Stress: Not on file  Social Connections: Not on file     Family History: The patient's family history includes Brain cancer in his sister; Diabetes in his mother; Leukemia in his brother; Melanoma in his brother. ROS:   Please see the history of present illness.    All 14 point review of systems negative except as described per history of present illness  EKGs/Labs/Other Studies Reviewed:      Recent Labs: 01/25/2022: Magnesium 2.0 04/08/2022: NT-Pro BNP 3,220 05/01/2022: ALT 15; BUN 19; Creatinine, Ser 2.00; Hemoglobin 10.8; Platelets 278; Potassium 4.3; Sodium 136  Recent Lipid Panel    Component Value Date/Time   CHOL 84 04/13/2021 0317   CHOL 247 (H) 06/01/2020 0858   TRIG 201 (H) 04/13/2021 0317   HDL 26 (L) 04/13/2021  0317   HDL 33 (L) 06/01/2020 0858   CHOLHDL 3.2 04/13/2021 0317   VLDL 40 04/13/2021 0317   LDLCALC 18 04/13/2021 0317   LDLCALC 151 (H) 06/01/2020 0858    Physical Exam:    VS:  BP 122/62 (BP Location: Left Arm, Patient Position: Sitting)   Pulse 72   Ht '6\' 1"'$  (1.854 m)   Wt 217 lb (98.4 kg)   SpO2 92%   BMI 28.63 kg/m     Wt Readings from Last 3 Encounters:  05/17/22 217 lb (98.4 kg)  04/19/22 219 lb 9.6 oz (99.6 kg)  02/28/22 223 lb (101.2 kg)     GEN:  Well nourished, well developed in no acute distress HEENT: Normal NECK: No JVD; No carotid bruits LYMPHATICS: No lymphadenopathy CARDIAC: RRR, no murmurs, no rubs, no gallops RESPIRATORY:  Clear to auscultation without rales, wheezing or rhonchi  ABDOMEN: Soft, non-tender, non-distended MUSCULOSKELETAL:  No edema; No deformity  SKIN: Warm and dry LOWER EXTREMITIES: no swelling NEUROLOGIC:  Alert and oriented x 3 PSYCHIATRIC:  Normal affect   ASSESSMENT:    1. Left bundle branch block   2. Pulmonary hypertension due to thromboembolism ([place])   3. Chronic HFrEF (heart failure with reduced ejection fraction) ([place])   4. S/P angioplasty with stent 10/04/20 DES to LCX    5. Hyperlipidemia LDL goal <70    PLAN:    In order of problems listed above:  Coronary disease stable look like no new issues.  On appropriate medication which include antiplatelets therapy which I will continue. Card myopathy he is on guideline directed medical therapy the problem is his blood pressure being low and inability to put him on a stable ARB or Entresto because of kidney dysfunction.  I did review his K PN which show me his creatinine 2.0 just done few days ago.  No room for additional medication. Dyslipidemia he is on high intensity statin I did review K PN which show me LDL 18 HDL 26 we will continue present management. I did present I did review interrogation normal function   Medication Adjustments/Labs and Tests Ordered: Current  medicines are reviewed at length with the  patient today.  Concerns regarding medicines are outlined above.  No orders of the defined types were placed in this encounter.  Medication changes: No orders of the defined types were placed in this encounter.   Signed, Park Liter, MD, [facility] 05/17/2022 4:52 PM    [place]

## 2022-05-17 NOTE — Therapy (Signed)
OUTPATIENT PHYSICAL THERAPY THORACOLUMBAR TREATMENT   Patient Name: Timothy Moran MRN: [id number] DOB:1945-11-21, 76 y.o., male Today's Date: 05/18/2022   PT End of Session - 05/18/22 1017     Visit Number 2    Date for PT Re-Evaluation 07/20/22    PT Start Time 1015    PT Stop Time 1058    PT Time Calculation (min) 43 min    Activity Tolerance Patient tolerated treatment well    Behavior During Therapy [facility] for tasks assessed/performed              Past Medical History:  Diagnosis Date   Acute blood loss anemia 10/05/2020   Acute respiratory failure ([place]), hypothermia therapy, vent - extubated 10/06/20    AKI (acute kidney injury) ([place]) 10/05/2020   Anoxic brain injury ([place]) 10/26/2020   Arrhythmia    Ascending aortic aneurysm ([place]) 01/29/2018   a. 2019 4.3 cm by echo; b. 02/2021 Echo: Ao root 89m.   Benign brain tumor (HOrgan    Benign neoplasm of brain (HBagn[place] 12/19/2017   Cardiac arrest with ventricular fibrillation (HKenn[place]    Chest pain 12/04/2017   Chronic kidney disease, stage III (moderate) (HSyosset 12/19/2017   CKD (chronic kidney disease), stage III - IV (HCC)    Coronary Artery Disease    a. 16/3149Inf STEMI complicated by cardiac arrest/PCI: LAD 50p/m, LCX 100d (2.5 x 30 Resolute Onyx DES); b. 04/2021 PCI: 04/2021 LM nl, LAD 50p/870m2.5x30 Onyx Frontier DES), LCX 25p, 5087mFR 0.98), 40d ISR (RFR 0.98), OM2 25, RCA mild diff dzs.   Diabetes mellitus, type 2 (HCCHubbard1/25/2022   10/06/20 A1C 7%   Dizziness 10/11/2019   Encounter for imaging study to confirm orogastric (OG) tube placement    Hematuria 10/26/2020   HFrEF (heart failure with reduced ejection fraction) (HCCMerrill[place]  a. 11/2020 Echo: EF 25-30%; b. 02/2021 Echo: EF 25-30%, glob HK. Mild LVH. Nl RV fxn. Triv AI. Ao root 9m29m History of pulmonary embolus (PE)    HLD (hyperlipidemia) 10/26/2020   Hypertension    Iron deficiency anemia rec'd IV iron 10/26/2020   Ischemic cardiomyopathy    a. 11/2020 Echo: EF  25-30%; b. 02/2021 Echo: EF 25-30%, glob HK.   Left bundle branch block 12/19/2017   Meningioma (HCC)[place]/11/2019   Nonsustained ventricular tachycardia (HCC)[place]/29/2021   Pain of left hip joint 05/29/2020   Personal history of pulmonary embolism 12/19/2017   Pneumonia of both lungs due to infectious organism    Pulmonary hypertension due to thromboembolism (HCC)[place]/28/2019   See echo 12/27/17 with nl PAS vs CTa chest 02/23/18    S/P angioplasty with stent 10/04/20 DES to LCX  10/26/2020   Sinus bradycardia 12/22/2017   SOB (shortness of breath)    Solitary pulmonary nodule on lung CT 03/08/2018   CT 12/04/17 1.0 x 0.8 x 0.7 cm nodular opacity in the RUL vs not seen 03/16/10 posterior segment of the right upper lobe. .SpiMarland Kitchenometry 03/08/2018    FEV1 3.86 (108%)  Ratio 96 s prior rx  - PET  03/13/18   Low grade c/w adenoca > rec T surgery eval     STEMI (ST elevation myocardial infarction) (HCC)[place]/23/2022   Urinary retention 10/26/2020   Vestibular schwannoma (HCC)[place]/11/2019   Past Surgical History:  Procedure Laterality Date   APPENDECTOMY     BIV ICD INSERTION CRT-D N/A 11/03/2021   Procedure: BIV ICD INSERTION CRT-D;  Surgeon: CamnConstance Haw;  Location:  [place] INVASIVE CV LAB;  Service: Cardiovascular;  Laterality: N/A;   CARDIAC CATHETERIZATION     CORONARY ANGIOPLASTY WITH STENT PLACEMENT Left 04/12/2021   LAD stent placement   CORONARY STENT INTERVENTION N/A 04/12/2021   Procedure: CORONARY STENT INTERVENTION;  Surgeon: Nelva Bush, MD;  Location: [place] CV LAB;  Service: Cardiovascular;  Laterality: N/A;   CORONARY/GRAFT ACUTE MI REVASCULARIZATION N/A 10/04/2020   Procedure: Coronary/Graft Acute MI Revascularization;  Surgeon: Nelva Bush, MD;  Location: [place] CV LAB;  Service: Cardiovascular;  Laterality: N/A;   HERNIA REPAIR     INTRAVASCULAR PRESSURE WIRE/FFR STUDY N/A 04/12/2021   Procedure: INTRAVASCULAR PRESSURE WIRE/FFR STUDY;  Surgeon: Nelva Bush, MD;   Location: [place] CV LAB;  Service: Cardiovascular;  Laterality: N/A;   INTRAVASCULAR ULTRASOUND/IVUS N/A 04/12/2021   Procedure: Intravascular Ultrasound/IVUS;  Surgeon: Nelva Bush, MD;  Location: [place] CV LAB;  Service: Cardiovascular;  Laterality: N/A;   LEFT HEART CATH AND CORONARY ANGIOGRAPHY N/A 10/04/2020   Procedure: LEFT HEART CATH AND CORONARY ANGIOGRAPHY;  Surgeon: Nelva Bush, MD;  Location: [place] CV LAB;  Service: Cardiovascular;  Laterality: N/A;   RIGHT/LEFT HEART CATH AND CORONARY ANGIOGRAPHY N/A 04/12/2021   Procedure: RIGHT/LEFT HEART CATH AND CORONARY ANGIOGRAPHY;  Surgeon: Nelva Bush, MD;  Location: [place] CV LAB;  Service: Cardiovascular;  Laterality: N/A;   Patient Active Problem List   Diagnosis Date Noted   Anemia    Leukocytosis    Right arm weakness 01/25/2022   Right sided weakness    Fall at home 01/22/2022   Status post biventricular pacemaker - MRI compatible PPM and leads 01/22/2022   Chronic indwelling Foley catheter 01/22/2022   CKD (chronic kidney disease), stage III - IV ([place]) 06/28/2021   Chronic HFrEF (heart failure with reduced ejection fraction) ([place]) 03/31/2021   Arrhythmia    DM (diabetes mellitus), type 2 ([place]) new 10/26/2020   Urinary retention 10/26/2020   Hyperlipidemia LDL goal <70 10/26/2020   Coronary artery disease of native artery of native heart with stable angina pectoris ([place]) 10/26/2020   S/P angioplasty with stent 10/04/20 DES to LCX  10/26/2020   Ischemic cardiomyopathy 10/26/2020   Diabetes mellitus, type 2 ([place]) 10/06/2020   DOE (dyspnea on exertion)    Meningioma ([place]) 08/14/2020   Vestibular schwannoma ([place]) 08/14/2020   Cardiac arrest with ventricular fibrillation (HCC)    Pain of left hip joint 05/29/2020   Benign brain tumor ([place])    Chronic kidney disease    History of pulmonary embolus (PE)    Hypertension    Nonsustained ventricular tachycardia ([place]) 10/11/2019   Dizziness  10/11/2019   Abnormal echocardiogram 06/07/2019   Pulmonary hypertension due to thromboembolism ([place]) 03/09/2018   Solitary pulmonary nodule on lung CT 03/08/2018   Ascending aortic aneurysm (HCC) 4.2 cm based on CT from December 2020 01/29/2018   Sinus bradycardia 12/22/2017   Left bundle branch block 12/19/2017   Personal history of pulmonary embolism - no longer on anticoagulation due to pt refusal to take it anymore. 12/19/2017   Chronic kidney disease, stage III (moderate) ([place]) 12/19/2017   Benign neoplasm of brain ([place]) 12/19/2017    PCP: Cari Caraway, MD   REFERRING PROVIDER: Cari Caraway, MD   REFERRING DIAG: Diagnosis 5677218905 (ICD-10-CM) - Muscle spasm of back M75.101 (ICD-10-CM) - Unspecified rotator cuff tear or rupture of right shoulder, not specified as traumatic   Rationale for Evaluation and Treatment Rehabilitation  THERAPY DIAG:  Right shoulder pain, unspecified chronicity  Right arm weakness  Poor body mechanics  Fall in home, initial encounter  Unsteadiness on feet  Other lack of coordination  Meningioma (HCC)  Muscle weakness (generalized)  Other abnormalities of gait and mobility  Other symptoms and signs involving the musculoskeletal system  Right sided weakness  ONSET DATE: 05/10/2022   SUBJECTIVE:                                                                                                                                                                                           SUBJECTIVE STATEMENT: I am weak and lightheaded today. No falls, using the walker to get around for the most part.   PERTINENT HISTORY:  Brief HPI:   Timothy Moran is a 76 y.o. male who fell backward striking his head and right neck shoulder area on 01/21/2022. He complained of right hemiparesis. CT head revealed left parafalcine meningioma.Started on Decadron with improvement in symptoms   PAIN:  Are you having pain? Yes: NPRS scale: 4/10 Pain location:  B low back, no radiating Pain description: sharp, grabbing Aggravating factors: movement Relieving factors: sitting still,    PRECAUTIONS: None  WEIGHT BEARING RESTRICTIONS No  FALLS:  Has patient fallen in last 6 months? Yes. Number of falls 2  LIVING ENVIRONMENT: Lives with: lives with their spouse Lives in: House/apartment Stairs: Yes: External: 7 steps; bilateral but cannot reach both Has following equipment at home: Gilford Rile - 2 wheeled  OCCUPATION: Retired  PLOF: Independent  PATIENT GOALS Feel normal with standing and functioning.   OBJECTIVE:   DIAGNOSTIC FINDINGS:  CT Lumbar- 3. Moderate degenerative spinal stenosis at L2-3. CT CERVICAL IMPRESSION: 1. No evidence of intracranial injury. 2. Negative for cervical spine fracture but there is prevertebral edema at C4-5 suggesting soft tissue injury. 3. Meningiomas with left cerebral of edema, reference brain MRI 01/22/2022.  PATIENT SURVEYS:  FOTO 63.1  SCREENING FOR RED FLAGS: Bowel or bladder incontinence: No Spinal tumors: No Cauda equina syndrome: No Compression fracture: No Abdominal aneurysm: Yes: Abdominal aortic aneurysm, stable  COGNITION:  Overall cognitive status: Within functional limits for tasks assessed and Patient appeared slightly more confused about the details of his injury at this time compared to his last visit.      SENSATION: WFL Patient reports tingling in B hands, all over.  MUSCLE LENGTH: Hamstrings: approximately 70 degrees B.  POSTURE: rounded shoulders and forward head  PALPATION: No TTP in R shoulder or up traps. B up traps tight, low back TTP R and L proximal to Illiac crest  LUMBAR ROM:  AROM limited in all directions, but he lost his balance frequently, so accuracy  of testing limited.   LOWER EXTREMITY ROM:      General mild stiffness throughout BLE with PROM.  LOWER EXTREMITY MMT:  grossly 4+/5   LUMBAR SPECIAL TESTS:  Slump test: Positive R  Shoulder  Special Tests empty can test (+) R, Hawkins-Kennedy test (+) R. Previous MRI reveals full tear of supraspinatus and partial tear of infraspinatus.  FUNCTIONAL TESTS:  5 times sit to stand: 27.07 Timed up and go (TUG): TBD Functional gait assessment: TBD  GAIT: Distance walked: 80 Assistive device utilized: Walker - 2 wheeled Level of assistance: SBA Comments: Patient was unsteady with turns, relied on UE support for balance.    TODAY'S TREATMENT  5xSTS TUG FGA  LE strength   Nustep L3 x44mns UE and LE  Rows and ext red TB 2x10     PATIENT EDUCATION:  Education details: POC, further assessment on next visit Person educated: Patient and Spouse Education method: Explanation Education comprehension: verbalized understanding   HOME EXERCISE PROGRAM: [place] ASSESSMENT:  CLINICAL IMPRESSION: We finished completing functional tests from the evaluation. Patient is SOB with minimal activities and requires longer rest breaks in between tests. Reassessed HEP and reviewed exercises to do at home. Ended session with light exercise on the Nustep and using red therabands.    OBJECTIVE IMPAIRMENTS Abnormal gait, decreased activity tolerance, decreased balance, decreased cognition, decreased coordination, difficulty walking, decreased ROM, decreased strength, increased muscle spasms, impaired flexibility, postural dysfunction, and pain.   ACTIVITY LIMITATIONS carrying, lifting, bending, standing, squatting, stairs, reach over head, and locomotion level  PARTICIPATION LIMITATIONS: cleaning, laundry, driving, shopping, and community activity  PERSONAL FACTORS Age and 1 comorbidity: multiple meningiomas  are also affecting patient's functional outcome.   REHAB POTENTIAL: Good  CLINICAL DECISION MAKING: Evolving/moderate complexity  EVALUATION COMPLEXITY: Moderate   GOALS: Goals reviewed with patient? Yes  SHORT TERM GOALS: Target date: 06/08/2022  I with initial  HEP Baseline: Goal status: INITIAL  LONG TERM GOALS: Target date: 07/20/2022  I with final HEP Baseline:  Goal status: INITIAL  2.  Patient will achieve 120 degrees of active shoulder elevation with pain < 3/10 Baseline: 103, pain 4/10  Goal status: INITIAL  3.  Complete 5 x STS in <12 sec to demonstrate improved LE strength Baseline: 27.07s Goal status: INITIAL  4.  Patient will complete TUG in < 12 sec with or without AD Baseline: 21.73s Goal status: INITIAL  5.  Patient will score at least 18/24 on FGA (Cannot attempt heel/toe gait) Baseline: 13/24 w/RW  Goal status: INITIAL  6.  Patient will walk at least 600' with LRAD, MI on level an unlevel surfaces, back pain < 3/10 Baseline: 80', unsteady, RW Goal status: INITIAL   PLAN: PT FREQUENCY: 2x/week  PT DURATION: 10 weeks  PLANNED INTERVENTIONS: Therapeutic exercises, Therapeutic activity, Neuromuscular re-education, Balance training, Gait training, Patient/Family education, Self Care, Joint mobilization, Stair training, Dry Needling, Electrical stimulation, Cryotherapy, Moist heat, Ultrasound, Ionotophoresis '4mg'$ /ml Dexamethasone, and Manual therapy.  PLAN FOR NEXT SESSION: shoulder exercises (flexion, rows, ext) gait and balance training   MAndris Baumann DPT 05/18/2022, 10:59 AM

## 2022-05-18 ENCOUNTER — Ambulatory Visit: Payer: Medicare Other | Attending: Family Medicine

## 2022-05-18 DIAGNOSIS — R531 Weakness: Secondary | ICD-10-CM

## 2022-05-18 DIAGNOSIS — M75101 Unspecified rotator cuff tear or rupture of right shoulder, not specified as traumatic: Secondary | ICD-10-CM | POA: Insufficient documentation

## 2022-05-18 DIAGNOSIS — M62830 Muscle spasm of back: Secondary | ICD-10-CM | POA: Diagnosis not present

## 2022-05-18 DIAGNOSIS — W19XXXA Unspecified fall, initial encounter: Secondary | ICD-10-CM

## 2022-05-18 DIAGNOSIS — R2681 Unsteadiness on feet: Secondary | ICD-10-CM

## 2022-05-18 DIAGNOSIS — M6281 Muscle weakness (generalized): Secondary | ICD-10-CM

## 2022-05-18 DIAGNOSIS — R278 Other lack of coordination: Secondary | ICD-10-CM

## 2022-05-18 DIAGNOSIS — R29898 Other symptoms and signs involving the musculoskeletal system: Secondary | ICD-10-CM

## 2022-05-18 DIAGNOSIS — M25511 Pain in right shoulder: Secondary | ICD-10-CM

## 2022-05-18 DIAGNOSIS — R2689 Other abnormalities of gait and mobility: Secondary | ICD-10-CM

## 2022-05-18 DIAGNOSIS — D329 Benign neoplasm of meninges, unspecified: Secondary | ICD-10-CM

## 2022-05-18 DIAGNOSIS — Y92009 Unspecified place in unspecified non-institutional (private) residence as the place of occurrence of the external cause: Secondary | ICD-10-CM

## 2022-05-19 ENCOUNTER — Ambulatory Visit: Payer: Medicare Other | Attending: Physician Assistant | Admitting: Physician Assistant

## 2022-05-19 ENCOUNTER — Encounter: Payer: Self-pay | Admitting: Physician Assistant

## 2022-05-19 VITALS — BP 118/62 | HR 70 | Ht 73.0 in | Wt 219.0 lb

## 2022-05-19 DIAGNOSIS — I255 Ischemic cardiomyopathy: Secondary | ICD-10-CM | POA: Diagnosis not present

## 2022-05-19 DIAGNOSIS — I2510 Atherosclerotic heart disease of native coronary artery without angina pectoris: Secondary | ICD-10-CM | POA: Diagnosis not present

## 2022-05-19 DIAGNOSIS — Z95810 Presence of automatic (implantable) cardiac defibrillator: Secondary | ICD-10-CM | POA: Diagnosis not present

## 2022-05-19 DIAGNOSIS — I493 Ventricular premature depolarization: Secondary | ICD-10-CM | POA: Insufficient documentation

## 2022-05-19 DIAGNOSIS — I5022 Chronic systolic (congestive) heart failure: Secondary | ICD-10-CM | POA: Diagnosis not present

## 2022-05-19 LAB — CUP PACEART INCLINIC DEVICE CHECK
Battery Remaining Longevity: 111 mo
Battery Voltage: 3.03 V
Brady Statistic AP VP Percent: 9.3 %
Brady Statistic AP VS Percent: 0.12 %
Brady Statistic AS VP Percent: 87.44 %
Brady Statistic AS VS Percent: 3.14 %
Brady Statistic RA Percent Paced: 9.01 %
Brady Statistic RV Percent Paced: 30.41 %
Date Time Interrogation Session: 20230907151232
HighPow Impedance: 65 Ohm
Implantable Lead Implant Date: 20230222
Implantable Lead Implant Date: 20230222
Implantable Lead Implant Date: 20230222
Implantable Lead Location: 753858
Implantable Lead Location: 753859
Implantable Lead Location: 753860
Implantable Lead Model: 4798
Implantable Lead Model: 5076
Implantable Pulse Generator Implant Date: 20230222
Lead Channel Impedance Value: 203.256
Lead Channel Impedance Value: 214.783
Lead Channel Impedance Value: 214.783
Lead Channel Impedance Value: 231.878
Lead Channel Impedance Value: 247 Ohm
Lead Channel Impedance Value: 380 Ohm
Lead Channel Impedance Value: 380 Ohm
Lead Channel Impedance Value: 437 Ohm
Lead Channel Impedance Value: 494 Ohm
Lead Channel Impedance Value: 494 Ohm
Lead Channel Impedance Value: 570 Ohm
Lead Channel Impedance Value: 589 Ohm
Lead Channel Impedance Value: 646 Ohm
Lead Channel Impedance Value: 703 Ohm
Lead Channel Impedance Value: 760 Ohm
Lead Channel Impedance Value: 779 Ohm
Lead Channel Impedance Value: 779 Ohm
Lead Channel Impedance Value: 836 Ohm
Lead Channel Pacing Threshold Amplitude: 0.5 V
Lead Channel Pacing Threshold Amplitude: 0.625 V
Lead Channel Pacing Threshold Amplitude: 1 V
Lead Channel Pacing Threshold Pulse Width: 0.4 ms
Lead Channel Pacing Threshold Pulse Width: 0.4 ms
Lead Channel Pacing Threshold Pulse Width: 0.4 ms
Lead Channel Sensing Intrinsic Amplitude: 1 mV
Lead Channel Sensing Intrinsic Amplitude: 1.25 mV
Lead Channel Sensing Intrinsic Amplitude: 15.125 mV
Lead Channel Sensing Intrinsic Amplitude: 16.125 mV
Lead Channel Setting Pacing Amplitude: 1.5 V
Lead Channel Setting Pacing Amplitude: 1.5 V
Lead Channel Setting Pacing Amplitude: 2 V
Lead Channel Setting Pacing Pulse Width: 0.4 ms
Lead Channel Setting Pacing Pulse Width: 0.4 ms
Lead Channel Setting Sensing Sensitivity: 0.3 mV

## 2022-05-19 NOTE — Patient Instructions (Signed)
Medication Instructions:   Your physician recommends that you continue on your current medications as directed. Please refer to the Current Medication list given to you today.  *If you need a refill on your cardiac medications before your next appointment, please call your pharmacy*   Lab Work: NONE ORDERED  TODAY   If you have labs (blood work) drawn today and your tests are completely normal, you will receive your results only by: MyChart Message (if you have MyChart) OR A paper copy in the mail If you have any lab test that is abnormal or we need to change your treatment, we will call you to review the results.   Testing/Procedures: NONE ORDERED  TODAY   Follow-Up: At [facility], you and your health needs are our priority.  As part of our continuing mission to provide you with exceptional heart care, we have created designated Provider Care Teams.  These Care Teams include your primary Cardiologist (physician) and Advanced Practice Providers (APPs -  Physician Assistants and Nurse Practitioners) who all work together to provide you with the care you need, when you need it.  We recommend signing up for the patient portal called "MyChart".  Sign up information is provided on this After Visit Summary.  MyChart is used to connect with patients for Virtual Visits (Telemedicine).  Patients are able to view lab/test results, encounter notes, upcoming appointments, etc.  Non-urgent messages can be sent to your provider as well.   To learn more about what you can do with MyChart, go to [email or web address].    Your next appointment:   1 year(s)  The format for your next appointment:   In Person  Provider:   You may see Will Martin Camnitz, MD or one of the following Advanced Practice Providers on your designated Care Team:   Renee Ursuy, PA-C Michael "Andy" Tillery, PA-C   Other Instructions   Important Information About Sugar       

## 2022-05-20 ENCOUNTER — Ambulatory Visit: Payer: Medicare Other | Admitting: Physical Therapy

## 2022-05-23 ENCOUNTER — Ambulatory Visit: Payer: Medicare Other | Admitting: Physical Therapy

## 2022-05-23 ENCOUNTER — Encounter: Payer: Self-pay | Admitting: Physical Therapy

## 2022-05-23 DIAGNOSIS — R29898 Other symptoms and signs involving the musculoskeletal system: Secondary | ICD-10-CM

## 2022-05-23 DIAGNOSIS — R3912 Poor urinary stream: Secondary | ICD-10-CM | POA: Diagnosis not present

## 2022-05-23 DIAGNOSIS — N189 Chronic kidney disease, unspecified: Secondary | ICD-10-CM | POA: Diagnosis not present

## 2022-05-23 DIAGNOSIS — N401 Enlarged prostate with lower urinary tract symptoms: Secondary | ICD-10-CM | POA: Diagnosis not present

## 2022-05-23 DIAGNOSIS — R278 Other lack of coordination: Secondary | ICD-10-CM

## 2022-05-23 DIAGNOSIS — R2689 Other abnormalities of gait and mobility: Secondary | ICD-10-CM

## 2022-05-23 DIAGNOSIS — D329 Benign neoplasm of meninges, unspecified: Secondary | ICD-10-CM

## 2022-05-23 DIAGNOSIS — Y92009 Unspecified place in unspecified non-institutional (private) residence as the place of occurrence of the external cause: Secondary | ICD-10-CM

## 2022-05-23 DIAGNOSIS — M6281 Muscle weakness (generalized): Secondary | ICD-10-CM

## 2022-05-23 DIAGNOSIS — R531 Weakness: Secondary | ICD-10-CM

## 2022-05-23 DIAGNOSIS — R338 Other retention of urine: Secondary | ICD-10-CM | POA: Diagnosis not present

## 2022-05-23 DIAGNOSIS — R2681 Unsteadiness on feet: Secondary | ICD-10-CM

## 2022-05-23 DIAGNOSIS — M75101 Unspecified rotator cuff tear or rupture of right shoulder, not specified as traumatic: Secondary | ICD-10-CM | POA: Diagnosis not present

## 2022-05-23 DIAGNOSIS — M25511 Pain in right shoulder: Secondary | ICD-10-CM

## 2022-05-23 DIAGNOSIS — N1832 Chronic kidney disease, stage 3b: Secondary | ICD-10-CM | POA: Diagnosis not present

## 2022-05-23 DIAGNOSIS — M62830 Muscle spasm of back: Secondary | ICD-10-CM | POA: Diagnosis not present

## 2022-05-23 NOTE — Therapy (Signed)
OUTPATIENT PHYSICAL THERAPY THORACOLUMBAR TREATMENT   Patient Name: Princeton Nabor MRN: [id number] DOB:1945/11/04, 76 y.o., male Today's Date: 05/23/2022   PT End of Session - 05/23/22 1150     Visit Number 3    Date for PT Re-Evaluation 07/20/22    PT Start Time 8756    PT Stop Time 1226    PT Time Calculation (min) 41 min    Activity Tolerance Patient tolerated treatment well    Behavior During Therapy [facility] for tasks assessed/performed               Past Medical History:  Diagnosis Date   Acute blood loss anemia 10/05/2020   Acute respiratory failure ([place]), hypothermia therapy, vent - extubated 10/06/20    AKI (acute kidney injury) ([place]) 10/05/2020   Anoxic brain injury ([place]) 10/26/2020   Arrhythmia    Ascending aortic aneurysm ([place]) 01/29/2018   a. 2019 4.3 cm by echo; b. 02/2021 Echo: Ao root 60m.   Benign brain tumor ([place]    Benign neoplasm of brain ([place] 12/19/2017   Cardiac arrest with ventricular fibrillation ([place]    Chest pain 12/04/2017   Chronic kidney disease, stage III (moderate) ([place] 12/19/2017   CKD (chronic kidney disease), stage III - IV (HCC)    Coronary Artery Disease    a. 14/3329Inf STEMI complicated by cardiac arrest/PCI: LAD 50p/m, LCX 100d (2.5 x 30 Resolute Onyx DES); b. 04/2021 PCI: 04/2021 LM nl, LAD 50p/849m2.5x30 Onyx Frontier DES), LCX 25p, 5076mFR 0.98), 40d ISR (RFR 0.98), OM2 25, RCA mild diff dzs.   Diabetes mellitus, type 2 (HCCPorter1/25/2022   10/06/20 A1C 7%   Dizziness 10/11/2019   Encounter for imaging study to confirm orogastric (OG) tube placement    Hematuria 10/26/2020   HFrEF (heart failure with reduced ejection fraction) ([place]  a. 11/2020 Echo: EF 25-30%; b. 02/2021 Echo: EF 25-30%, glob HK. Mild LVH. Nl RV fxn. Triv AI. Ao root 7m96m History of pulmonary embolus (PE)    HLD (hyperlipidemia) 10/26/2020   Hypertension    Iron deficiency anemia rec'd IV iron 10/26/2020   Ischemic cardiomyopathy    a. 11/2020 Echo: EF  25-30%; b. 02/2021 Echo: EF 25-30%, glob HK.   Left bundle branch block 12/19/2017   Meningioma (HCC)[place]/11/2019   Nonsustained ventricular tachycardia (HCC)[place]/29/2021   Pain of left hip joint 05/29/2020   Personal history of pulmonary embolism 12/19/2017   Pneumonia of both lungs due to infectious organism    Pulmonary hypertension due to thromboembolism (HCC)[place]/28/2019   See echo 12/27/17 with nl PAS vs CTa chest 02/23/18    S/P angioplasty with stent 10/04/20 DES to LCX  10/26/2020   Sinus bradycardia 12/22/2017   SOB (shortness of breath)    Solitary pulmonary nodule on lung CT 03/08/2018   CT 12/04/17 1.0 x 0.8 x 0.7 cm nodular opacity in the RUL vs not seen 03/16/10 posterior segment of the right upper lobe. .SpiMarland Kitchenometry 03/08/2018    FEV1 3.86 (108%)  Ratio 96 s prior rx  - PET  03/13/18   Low grade c/w adenoca > rec T surgery eval     STEMI (ST elevation myocardial infarction) (HCC)[place]/23/2022   Urinary retention 10/26/2020   Vestibular schwannoma (HCC)[place]/11/2019   Past Surgical History:  Procedure Laterality Date   APPENDECTOMY     BIV ICD INSERTION CRT-D N/A 11/03/2021   Procedure: BIV ICD INSERTION CRT-D;  Surgeon: CamnConstance Haw;  Location: [place] [facility];  Service: Cardiovascular;  Laterality: N/A;   CARDIAC CATHETERIZATION     CORONARY ANGIOPLASTY WITH STENT PLACEMENT Left 04/12/2021   LAD stent placement   CORONARY STENT INTERVENTION N/A 04/12/2021   Procedure: CORONARY STENT INTERVENTION;  Surgeon: Nelva Bush, MD;  Location: [place] [facility];  Service: Cardiovascular;  Laterality: N/A;   CORONARY/GRAFT ACUTE MI REVASCULARIZATION N/A 10/04/2020   Procedure: Coronary/Graft Acute MI Revascularization;  Surgeon: Nelva Bush, MD;  Location: [place] [facility];  Service: Cardiovascular;  Laterality: N/A;   HERNIA REPAIR     INTRAVASCULAR PRESSURE WIRE/FFR STUDY N/A 04/12/2021   Procedure: INTRAVASCULAR PRESSURE WIRE/FFR STUDY;  Surgeon: Nelva Bush, MD;   Location: [place] [facility];  Service: Cardiovascular;  Laterality: N/A;   INTRAVASCULAR ULTRASOUND/IVUS N/A 04/12/2021   Procedure: Intravascular Ultrasound/IVUS;  Surgeon: Nelva Bush, MD;  Location: [place] [facility];  Service: Cardiovascular;  Laterality: N/A;   LEFT HEART CATH AND CORONARY ANGIOGRAPHY N/A 10/04/2020   Procedure: LEFT HEART CATH AND CORONARY ANGIOGRAPHY;  Surgeon: Nelva Bush, MD;  Location: [place] [facility];  Service: Cardiovascular;  Laterality: N/A;   RIGHT/LEFT HEART CATH AND CORONARY ANGIOGRAPHY N/A 04/12/2021   Procedure: RIGHT/LEFT HEART CATH AND CORONARY ANGIOGRAPHY;  Surgeon: Nelva Bush, MD;  Location: [place] [facility];  Service: Cardiovascular;  Laterality: N/A;   Patient Active Problem List   Diagnosis Date Noted   Anemia    Leukocytosis    Right arm weakness 01/25/2022   Right sided weakness    Fall at home 01/22/2022   Status post biventricular pacemaker - MRI compatible PPM and leads 01/22/2022   Chronic indwelling Foley catheter 01/22/2022   CKD (chronic kidney disease), stage III - IV ([place]) 06/28/2021   Chronic HFrEF (heart failure with reduced ejection fraction) ([place]) 03/31/2021   Arrhythmia    DM (diabetes mellitus), type 2 ([place]) new 10/26/2020   Urinary retention 10/26/2020   Hyperlipidemia LDL goal <70 10/26/2020   Coronary artery disease of native artery of native heart with stable angina pectoris ([place]) 10/26/2020   S/P angioplasty with stent 10/04/20 DES to LCX  10/26/2020   Ischemic cardiomyopathy 10/26/2020   Diabetes mellitus, type 2 ([place]) 10/06/2020   DOE (dyspnea on exertion)    Meningioma ([place]) 08/14/2020   Vestibular schwannoma ([place]) 08/14/2020   Cardiac arrest with ventricular fibrillation (HCC)    Pain of left hip joint 05/29/2020   Benign brain tumor ([place])    Chronic kidney disease    History of pulmonary embolus (PE)    Hypertension    Nonsustained ventricular tachycardia ([place]) 10/11/2019   Dizziness  10/11/2019   Abnormal echocardiogram 06/07/2019   Pulmonary hypertension due to thromboembolism ([place]) 03/09/2018   Solitary pulmonary nodule on lung CT 03/08/2018   Ascending aortic aneurysm (HCC) 4.2 cm based on CT from December 2020 01/29/2018   Sinus bradycardia 12/22/2017   Left bundle branch block 12/19/2017   Personal history of pulmonary embolism - no longer on anticoagulation due to pt refusal to take it anymore. 12/19/2017   Chronic kidney disease, stage III (moderate) ([place]) 12/19/2017   Benign neoplasm of brain ([place]) 12/19/2017    PCP: Cari Caraway, MD   REFERRING PROVIDER: Cari Caraway, MD   REFERRING DIAG: Diagnosis 2496408771 (ICD-10-CM) - Muscle spasm of back M75.101 (ICD-10-CM) - Unspecified rotator cuff tear or rupture of right shoulder, not specified as traumatic   Rationale for Evaluation and Treatment Rehabilitation  THERAPY DIAG:  Right shoulder pain, unspecified  chronicity  Right arm weakness  Poor body mechanics  Fall in home, initial encounter  Unsteadiness on feet  Other lack of coordination  Right sided weakness  Other symptoms and signs involving the musculoskeletal system  Other abnormalities of gait and mobility  Muscle weakness (generalized)  Meningioma (HCC)  ONSET DATE: 05/10/2022   SUBJECTIVE:                                                                                                                                                                                           SUBJECTIVE STATEMENT: I am weak and lightheaded today. No falls, using the walker to get around for the most part.   PERTINENT HISTORY:  Brief HPI:   Lex Linhares is a 76 y.o. male who fell backward striking his head and right neck shoulder area on 01/21/2022. He complained of right hemiparesis. CT head revealed left parafalcine meningioma.Started on Decadron with improvement in symptoms   PAIN:  Are you having pain? Yes: NPRS scale: 4/10 Pain location:  B low back, no radiating Pain description: sharp, grabbing Aggravating factors: movement Relieving factors: sitting still,    PRECAUTIONS: None  WEIGHT BEARING RESTRICTIONS No  FALLS:  Has patient fallen in last 6 months? Yes. Number of falls 2  LIVING ENVIRONMENT: Lives with: lives with their spouse Lives in: House/apartment Stairs: Yes: External: 7 steps; bilateral but cannot reach both Has following equipment at home: Gilford Rile - 2 wheeled  OCCUPATION: Retired  PLOF: Independent  PATIENT GOALS Feel normal with standing and functioning.   OBJECTIVE:   DIAGNOSTIC FINDINGS:  CT Lumbar- 3. Moderate degenerative spinal stenosis at L2-3. CT CERVICAL IMPRESSION: 1. No evidence of intracranial injury. 2. Negative for cervical spine fracture but there is prevertebral edema at C4-5 suggesting soft tissue injury. 3. Meningiomas with left cerebral of edema, reference brain MRI 01/22/2022.  PATIENT SURVEYS:  FOTO 63.1  SCREENING FOR RED FLAGS: Bowel or bladder incontinence: No Spinal tumors: No Cauda equina syndrome: No Compression fracture: No Abdominal aneurysm: Yes: Abdominal aortic aneurysm, stable  COGNITION:  Overall cognitive status: Within functional limits for tasks assessed and Patient appeared slightly more confused about the details of his injury at this time compared to his last visit.      SENSATION: WFL Patient reports tingling in B hands, all over.  MUSCLE LENGTH: Hamstrings: approximately 70 degrees B.  POSTURE: rounded shoulders and forward head  PALPATION: No TTP in R shoulder or up traps. B up traps tight, low back TTP R and L proximal to Illiac crest  LUMBAR ROM:  AROM limited in all directions, but he lost his balance frequently,  so accuracy of testing limited.   LOWER EXTREMITY ROM:      General mild stiffness throughout BLE with PROM.  LOWER EXTREMITY MMT:  grossly 4+/5   LUMBAR SPECIAL TESTS:  Slump test: Positive R  Shoulder  Special Tests empty can test (+) R, Hawkins-Kennedy test (+) R. Previous MRI reveals full tear of supraspinatus and partial tear of infraspinatus.  FUNCTIONAL TESTS:  5 times sit to stand: 27.07 Timed up and go (TUG): TBD Functional gait assessment: TBD  GAIT: Distance walked: 80 Assistive device utilized: Walker - 2 wheeled Level of assistance: SBA Comments: Patient was unsteady with turns, relied on UE support for balance.    TODAY'S TREATMENT  05/23/22 NuStep L5 x 6 minutes Orthostatic BP sitting 121/73, 56 BPM, Immediate standing 92/50, 54 BPM, symptomatic. Symptoms did not resolve, but BP monitor malfunctioned at 3 minutes. Standing hip flexion and heel raises with minimal UE support on RW 10 each Standing side to side step x 10, then forward lunges alternating each leg x 5  Ambulated 1 x 120' to car with CGA using gait belt, mild unsteadiness noted, especially upon first rising.  5xSTS TUG FGA  LE strength   Nustep L3 x56mns UE and LE  Rows and ext red TB 2x10     PATIENT EDUCATION:  Education details: POC, further assessment on next visit Person educated: Patient and Spouse Education method: Explanation Education comprehension: verbalized understanding   HOME EXERCISE PROGRAM: RFort Dick ASSESSMENT:  CLINICAL IMPRESSION: Patient arrived with C/O increased dizziness and was unsteady upon standing. Orthostatic BP's noted after sit to stand. Dr KAgustin Creewas sent a note with assessment result. Treatment was modified to practice balance training in front of mat.   OBJECTIVE IMPAIRMENTS Abnormal gait, decreased activity tolerance, decreased balance, decreased cognition, decreased coordination, difficulty walking, decreased ROM, decreased strength, increased muscle spasms, impaired flexibility, postural dysfunction, and pain.   ACTIVITY LIMITATIONS carrying, lifting, bending, standing, squatting, stairs, reach over head, and locomotion level  PARTICIPATION  LIMITATIONS: cleaning, laundry, driving, shopping, and community activity  PERSONAL FACTORS Age and 1 comorbidity: multiple meningiomas  are also affecting patient's functional outcome.   REHAB POTENTIAL: Good  CLINICAL DECISION MAKING: Evolving/moderate complexity  EVALUATION COMPLEXITY: Moderate   GOALS: Goals reviewed with patient? Yes  SHORT TERM GOALS: Target date: 06/08/2022  I with initial HEP Baseline: Goal status: ongoing  LONG TERM GOALS: Target date: 07/20/2022  I with final HEP Baseline:  Goal status: INITIAL  2.  Patient will achieve 120 degrees of active shoulder elevation with pain < 3/10 Baseline: 103, pain 4/10  Goal status: INITIAL  3.  Complete 5 x STS in <12 sec to demonstrate improved LE strength Baseline: 27.07s Goal status: INITIAL  4.  Patient will complete TUG in < 12 sec with or without AD Baseline: 21.73s Goal status: INITIAL  5.  Patient will score at least 18/24 on FGA (Cannot attempt heel/toe gait) Baseline: 13/24 w/RW  Goal status: INITIAL  6.  Patient will walk at least 600' with LRAD, MI on level an unlevel surfaces, back pain < 3/10 Baseline: 80', unsteady, RW Goal status: INITIAL   PLAN: PT FREQUENCY: 2x/week  PT DURATION: 10 weeks  PLANNED INTERVENTIONS: Therapeutic exercises, Therapeutic activity, Neuromuscular re-education, Balance training, Gait training, Patient/Family education, Self Care, Joint mobilization, Stair training, Dry Needling, Electrical stimulation, Cryotherapy, Moist heat, Ultrasound, Ionotophoresis '4mg'$ /ml Dexamethasone, and Manual therapy.  PLAN FOR NEXT SESSION: shoulder exercises (flexion, rows, ext) gait and balance training   SManuela Schwartz  Marcene Laskowski DPT 05/23/22 12:30 PM  05/23/2022, 12:30 PM

## 2022-05-24 ENCOUNTER — Ambulatory Visit (HOSPITAL_BASED_OUTPATIENT_CLINIC_OR_DEPARTMENT_OTHER)
Admission: RE | Admit: 2022-05-24 | Discharge: 2022-05-24 | Disposition: A | Discharge status: Home / Self Care | Payer: Medicare Other | Source: Ambulatory Visit | Attending: Cardiology | Admitting: Cardiology

## 2022-05-24 DIAGNOSIS — R0609 Other forms of dyspnea: Secondary | ICD-10-CM | POA: Diagnosis not present

## 2022-05-24 LAB — ECHOCARDIOGRAM COMPLETE
AR max vel: 1.54 cm2
AV Area VTI: 1.4 cm2
AV Area mean vel: 1.5 cm2
AV Mean grad: 5 mmHg
AV Peak grad: 8.4 mmHg
Ao pk vel: 1.45 m/s
Area-P 1/2: 4.15 cm2
Calc EF: 31.8 %
S' Lateral: 5.3 cm
Single Plane A2C EF: 27.8 %
Single Plane A4C EF: 37.1 %

## 2022-05-24 MED ORDER — PERFLUTREN LIPID MICROSPHERE: 1.0000 mL | INTRAVENOUS | Status: DC | PRN

## 2022-05-24 MED ORDER — PERFLUTREN LIPID MICROSPHERE
1.0000 mL | INTRAVENOUS | Status: AC | PRN
  Administered 2022-05-24: 2 mL via INTRAVENOUS

## 2022-05-24 NOTE — Progress Notes (Signed)
  Echocardiogram 2D Echocardiogram with contrast has been performed.  Merrie Roof F 05/24/2022, 1:05 PM

## 2022-05-25 ENCOUNTER — Ambulatory Visit: Payer: Medicare Other | Admitting: Physical Therapy

## 2022-05-25 ENCOUNTER — Encounter: Payer: Self-pay | Admitting: Physical Therapy

## 2022-05-25 DIAGNOSIS — M75101 Unspecified rotator cuff tear or rupture of right shoulder, not specified as traumatic: Secondary | ICD-10-CM | POA: Diagnosis not present

## 2022-05-25 DIAGNOSIS — R531 Weakness: Secondary | ICD-10-CM

## 2022-05-25 DIAGNOSIS — M6281 Muscle weakness (generalized): Secondary | ICD-10-CM

## 2022-05-25 DIAGNOSIS — Y92009 Unspecified place in unspecified non-institutional (private) residence as the place of occurrence of the external cause: Secondary | ICD-10-CM

## 2022-05-25 DIAGNOSIS — R2681 Unsteadiness on feet: Secondary | ICD-10-CM

## 2022-05-25 DIAGNOSIS — D329 Benign neoplasm of meninges, unspecified: Secondary | ICD-10-CM

## 2022-05-25 DIAGNOSIS — M25511 Pain in right shoulder: Secondary | ICD-10-CM

## 2022-05-25 DIAGNOSIS — M62830 Muscle spasm of back: Secondary | ICD-10-CM | POA: Diagnosis not present

## 2022-05-25 DIAGNOSIS — R29898 Other symptoms and signs involving the musculoskeletal system: Secondary | ICD-10-CM

## 2022-05-25 DIAGNOSIS — R278 Other lack of coordination: Secondary | ICD-10-CM

## 2022-05-25 NOTE — Therapy (Signed)
OUTPATIENT PHYSICAL THERAPY THORACOLUMBAR TREATMENT   Patient Name: Timothy Moran MRN: [id number] DOB:Feb 26, 1946, 76 y.o., male Today's Date: 05/25/2022   PT End of Session - 05/25/22 1022     Visit Number 4    Date for PT Re-Evaluation 07/20/22    PT Start Time 1016    PT Stop Time 1054    PT Time Calculation (min) 38 min    Activity Tolerance Patient tolerated treatment well    Behavior During Therapy [facility] for tasks assessed/performed                Past Medical History:  Diagnosis Date   Acute blood loss anemia 10/05/2020   Acute respiratory failure ([place]), hypothermia therapy, vent - extubated 10/06/20    AKI (acute kidney injury) ([place]) 10/05/2020   Anoxic brain injury ([place]) 10/26/2020   Arrhythmia    Ascending aortic aneurysm ([place]) 01/29/2018   a. 2019 4.3 cm by echo; b. 02/2021 Echo: Ao root 101m.   Benign brain tumor ([place]    Benign neoplasm of brain ([place] 12/19/2017   Cardiac arrest with ventricular fibrillation ([place]    Chest pain 12/04/2017   Chronic kidney disease, stage III (moderate) ([place] 12/19/2017   CKD (chronic kidney disease), stage III - IV (HCC)    Coronary Artery Disease    a. 15/0932Inf STEMI complicated by cardiac arrest/PCI: LAD 50p/m, LCX 100d (2.5 x 30 Resolute Onyx DES); b. 04/2021 PCI: 04/2021 LM nl, LAD 50p/850m2.5x30 Onyx Frontier DES), LCX 25p, 5058mFR 0.98), 40d ISR (RFR 0.98), OM2 25, RCA mild diff dzs.   Diabetes mellitus, type 2 ([facility]1/25/2022   10/06/20 A1C 7%   Dizziness 10/11/2019   Encounter for imaging study to confirm orogastric (OG) tube placement    Hematuria 10/26/2020   HFrEF (heart failure with reduced ejection fraction) ([facility]  a. 11/2020 Echo: EF 25-30%; b. 02/2021 Echo: EF 25-30%, glob HK. Mild LVH. Nl RV fxn. Triv AI. Ao root 30m30m History of pulmonary embolus (PE)    HLD (hyperlipidemia) 10/26/2020   Hypertension    Iron deficiency anemia rec'd IV iron 10/26/2020   Ischemic cardiomyopathy    a. 11/2020 Echo: EF  25-30%; b. 02/2021 Echo: EF 25-30%, glob HK.   Left bundle branch block 12/19/2017   Meningioma (HCC)[place]/11/2019   Nonsustained ventricular tachycardia (HCC)[place]/29/2021   Pain of left hip joint 05/29/2020   Personal history of pulmonary embolism 12/19/2017   Pneumonia of both lungs due to infectious organism    Pulmonary hypertension due to thromboembolism (HCC)[place]/28/2019   See echo 12/27/17 with nl PAS vs CTa chest 02/23/18    S/P angioplasty with stent 10/04/20 DES to LCX  10/26/2020   Sinus bradycardia 12/22/2017   SOB (shortness of breath)    Solitary pulmonary nodule on lung CT 03/08/2018   CT 12/04/17 1.0 x 0.8 x 0.7 cm nodular opacity in the RUL vs not seen 03/16/10 posterior segment of the right upper lobe. .SpiMarland Kitchenometry 03/08/2018    FEV1 3.86 (108%)  Ratio 96 s prior rx  - PET  03/13/18   Low grade c/w adenoca > rec T surgery eval     STEMI (ST elevation myocardial infarction) (HCC)[place]/23/2022   Urinary retention 10/26/2020   Vestibular schwannoma (HCC)[place]/11/2019   Past Surgical History:  Procedure Laterality Date   APPENDECTOMY     BIV ICD INSERTION CRT-D N/A 11/03/2021   Procedure: BIV ICD INSERTION CRT-D;  Surgeon: CamnConstance Haw;  Location: [facility];  Service: Cardiovascular;  Laterality: N/A;   CARDIAC CATHETERIZATION     CORONARY ANGIOPLASTY WITH STENT PLACEMENT Left 04/12/2021   LAD stent placement   CORONARY STENT INTERVENTION N/A 04/12/2021   Procedure: CORONARY STENT INTERVENTION;  Surgeon: Nelva Bush, MD;  Location: [facility];  Service: Cardiovascular;  Laterality: N/A;   CORONARY/GRAFT ACUTE MI REVASCULARIZATION N/A 10/04/2020   Procedure: Coronary/Graft Acute MI Revascularization;  Surgeon: Nelva Bush, MD;  Location: [facility];  Service: Cardiovascular;  Laterality: N/A;   HERNIA REPAIR     INTRAVASCULAR PRESSURE WIRE/FFR STUDY N/A 04/12/2021   Procedure: INTRAVASCULAR PRESSURE WIRE/FFR STUDY;  Surgeon: Nelva Bush, MD;   Location: [facility];  Service: Cardiovascular;  Laterality: N/A;   INTRAVASCULAR ULTRASOUND/IVUS N/A 04/12/2021   Procedure: Intravascular Ultrasound/IVUS;  Surgeon: Nelva Bush, MD;  Location: [facility];  Service: Cardiovascular;  Laterality: N/A;   LEFT HEART CATH AND CORONARY ANGIOGRAPHY N/A 10/04/2020   Procedure: LEFT HEART CATH AND CORONARY ANGIOGRAPHY;  Surgeon: Nelva Bush, MD;  Location: [facility];  Service: Cardiovascular;  Laterality: N/A;   RIGHT/LEFT HEART CATH AND CORONARY ANGIOGRAPHY N/A 04/12/2021   Procedure: RIGHT/LEFT HEART CATH AND CORONARY ANGIOGRAPHY;  Surgeon: Nelva Bush, MD;  Location: [facility];  Service: Cardiovascular;  Laterality: N/A;   Patient Active Problem List   Diagnosis Date Noted   Anemia    Leukocytosis    Right arm weakness 01/25/2022   Right sided weakness    Fall at home 01/22/2022   Status post biventricular pacemaker - MRI compatible PPM and leads 01/22/2022   Chronic indwelling Foley catheter 01/22/2022   CKD (chronic kidney disease), stage III - IV ([place]) 06/28/2021   Chronic HFrEF (heart failure with reduced ejection fraction) ([place]) 03/31/2021   Arrhythmia    DM (diabetes mellitus), type 2 ([place]) new 10/26/2020   Urinary retention 10/26/2020   Hyperlipidemia LDL goal <70 10/26/2020   Coronary artery disease of native artery of native heart with stable angina pectoris ([place]) 10/26/2020   S/P angioplasty with stent 10/04/20 DES to LCX  10/26/2020   Ischemic cardiomyopathy 10/26/2020   Diabetes mellitus, type 2 ([place]) 10/06/2020   DOE (dyspnea on exertion)    Meningioma ([place]) 08/14/2020   Vestibular schwannoma ([place]) 08/14/2020   Cardiac arrest with ventricular fibrillation (HCC)    Pain of left hip joint 05/29/2020   Benign brain tumor ([place])    Chronic kidney disease    History of pulmonary embolus (PE)    Hypertension    Nonsustained ventricular tachycardia ([place]) 10/11/2019   Dizziness  10/11/2019   Abnormal echocardiogram 06/07/2019   Pulmonary hypertension due to thromboembolism ([place]) 03/09/2018   Solitary pulmonary nodule on lung CT 03/08/2018   Ascending aortic aneurysm (HCC) 4.2 cm based on CT from December 2020 01/29/2018   Sinus bradycardia 12/22/2017   Left bundle branch block 12/19/2017   Personal history of pulmonary embolism - no longer on anticoagulation due to pt refusal to take it anymore. 12/19/2017   Chronic kidney disease, stage III (moderate) ([place]) 12/19/2017   Benign neoplasm of brain ([place]) 12/19/2017    PCP: Cari Caraway, MD   REFERRING PROVIDER: Cari Caraway, MD   REFERRING DIAG: Diagnosis [phone] (ICD-10-CM) - Muscle spasm of back M75.101 (ICD-10-CM) - Unspecified rotator cuff tear or rupture of right shoulder, not specified as traumatic   Rationale for Evaluation and Treatment Rehabilitation  THERAPY DIAG:  Right shoulder pain, unspecified  chronicity  Right arm weakness  Poor body mechanics  Fall in home, initial encounter  Unsteadiness on feet  Right sided weakness  Other lack of coordination  Muscle weakness (generalized)  Meningioma ([place])  ONSET DATE: 05/10/2022   SUBJECTIVE:                                                                                                                                                                                           SUBJECTIVE STATEMENT: Patient arrives with RW. Less unsteadiness upon immediate standing. Reports no new issues, dizziness presnt, but not as bad as on his last visit.  PERTINENT HISTORY:  Brief HPI:   Cinch Ormond is a 76 y.o. male who fell backward striking his head and right neck shoulder area on 01/21/2022. He complained of right hemiparesis. CT head revealed left parafalcine meningioma.Started on Decadron with improvement in symptoms   PAIN:  Are you having pain? Yes: NPRS scale: 4/10 Pain location: B low back, no radiating Pain description: sharp,  grabbing Aggravating factors: movement Relieving factors: sitting still,    PRECAUTIONS: None  WEIGHT BEARING RESTRICTIONS No  FALLS:  Has patient fallen in last 6 months? Yes. Number of falls 2  LIVING ENVIRONMENT: Lives with: lives with their spouse Lives in: House/apartment Stairs: Yes: External: 7 steps; bilateral but cannot reach both Has following equipment at home: Gilford Rile - 2 wheeled  OCCUPATION: Retired  PLOF: Independent  PATIENT GOALS Feel normal with standing and functioning.   OBJECTIVE:   DIAGNOSTIC FINDINGS:  CT Lumbar- 3. Moderate degenerative spinal stenosis at L2-3. CT CERVICAL IMPRESSION: 1. No evidence of intracranial injury. 2. Negative for cervical spine fracture but there is prevertebral edema at C4-5 suggesting soft tissue injury. 3. Meningiomas with left cerebral of edema, reference brain MRI 01/22/2022.  PATIENT SURVEYS:  FOTO 63.1  SCREENING FOR RED FLAGS: Bowel or bladder incontinence: No Spinal tumors: No Cauda equina syndrome: No Compression fracture: No Abdominal aneurysm: Yes: Abdominal aortic aneurysm, stable  COGNITION:  Overall cognitive status: Within functional limits for tasks assessed and Patient appeared slightly more confused about the details of his injury at this time compared to his last visit.      SENSATION: WFL Patient reports tingling in B hands, all over.  MUSCLE LENGTH: Hamstrings: approximately 70 degrees B.  POSTURE: rounded shoulders and forward head  PALPATION: No TTP in R shoulder or up traps. B up traps tight, low back TTP R and L proximal to Illiac crest  LUMBAR ROM:  AROM limited in all directions, but he lost his balance frequently, so accuracy of testing limited.   LOWER EXTREMITY ROM:  General mild stiffness throughout BLE with PROM.  LOWER EXTREMITY MMT:  grossly 4+/5   LUMBAR SPECIAL TESTS:  Slump test: Positive R  Shoulder Special Tests empty can test (+) R, Hawkins-Kennedy  test (+) R. Previous MRI reveals full tear of supraspinatus and partial tear of infraspinatus.  FUNCTIONAL TESTS:  5 times sit to stand: 27.07 Timed up and go (TUG): TBD Functional gait assessment: TBD  GAIT: Distance walked: 80 Assistive device utilized: Walker - 2 wheeled Level of assistance: SBA Comments: Patient was unsteady with turns, relied on UE support for balance.    TODAY 05/25/22 Recumbent bike L5 x 6 minutes. Supine strength with Physioball, bridging, bridge with hip IR x 10 reps each, roll ball side to side while in bridge x 5 reps. Supine clamshells against Green Tband resistance. Standing B side to side step x 10 with Green Tband resistance around ankles. Wall stars, 3 points x 5 with Yellow Tband resistance, B PROM for R shoulder flex, ext, ER, Abd Wall slides into shouldre flexion x 10 reps  05/23/22 NuStep L5 x 6 minutes Orthostatic BP sitting 121/73, 56 BPM, Immediate standing 92/50, 54 BPM, symptomatic. Symptoms did not resolve, but BP monitor malfunctioned at 3 minutes. Standing hip flexion and heel raises with minimal UE support on RW 10 each Standing side to side step x 10, then forward lunges alternating each leg x 5  Ambulated 1 x 120' to car with CGA using gait belt, mild unsteadiness noted, especially upon first rising.  5xSTS TUG FGA  LE strength   Nustep L3 x69mns UE and LE  Rows and ext red TB 2x10     PATIENT EDUCATION:  Education details: POC, further assessment on next visit Person educated: Patient and Spouse Education method: Explanation Education comprehension: verbalized understanding   HOME [place]ERCISE PROGRAM: [place] ASSESSMENT:  CLINICAL IMPRESSION: Patient reports improved dizziness, but still present at times, not as unsteady today when standing. Treatment focused on strength in both upper and lower trunk and legs to increase stability and decrease fall risk.   OBJECTIVE IMPAIRMENTS Abnormal gait, decreased activity  tolerance, decreased balance, decreased cognition, decreased coordination, difficulty walking, decreased ROM, decreased strength, increased muscle spasms, impaired flexibility, postural dysfunction, and pain.   ACTIVITY LIMITATIONS carrying, lifting, bending, standing, squatting, stairs, reach over head, and locomotion level  PARTICIPATION LIMITATIONS: cleaning, laundry, driving, shopping, and community activity  PERSONAL FACTORS Age and 1 comorbidity: multiple meningiomas  are also affecting patient's functional outcome.   REHAB POTENTIAL: Good  CLINICAL DECISION MAKING: Evolving/moderate complexity  EVALUATION COMPLEXITY: Moderate   GOALS: Goals reviewed with patient? Yes  SHORT TERM GOALS: Target date: 06/08/2022  I with initial HEP Baseline: Goal status: ongoing  LONG TERM GOALS: Target date: 07/20/2022  I with final HEP Baseline:  Goal status: INITIAL  2.  Patient will achieve 120 degrees of active shoulder elevation with pain < 3/10 Baseline: 103, pain 4/10  Goal status: ongoing  3.  Complete 5 x STS in <12 sec to demonstrate improved LE strength Baseline: 27.07s Goal status: INITIAL  4.  Patient will complete TUG in < 12 sec with or without AD Baseline: 21.73s Goal status: INITIAL  5.  Patient will score at least 18/24 on FGA (Cannot attempt heel/toe gait) Baseline: 13/24 w/RW  Goal status: INITIAL  6.  Patient will walk at least 600' with LRAD, MI on level an unlevel surfaces, back pain < 3/10 Baseline: 80', unsteady, RW Goal status: INITIAL   PLAN: PT FREQUENCY:  2x/week  PT DURATION: 10 weeks  PLANNED INTERVENTIONS: Therapeutic exercises, Therapeutic activity, Neuromuscular re-education, Balance training, Gait training, Patient/Family education, Self Care, Joint mobilization, Stair training, Dry Needling, Electrical stimulation, Cryotherapy, Moist heat, Ultrasound, Ionotophoresis '4mg'$ /ml Dexamethasone, and Manual therapy.  PLAN FOR NEXT SESSION:  shoulder exercises (flexion, rows, ext) gait and balance training   Ethel Rana DPT 05/25/22 10:57 AM  05/25/2022, 10:57 AM

## 2022-05-26 ENCOUNTER — Telehealth: Payer: Self-pay | Admitting: Cardiology

## 2022-05-26 ENCOUNTER — Ambulatory Visit: Payer: Medicare Other | Admitting: Physical Therapy

## 2022-05-26 NOTE — Telephone Encounter (Signed)
Spoke with pt. Advised that [name] has not commented on his ECHO results yet and we will call when he gets them reviewed. Pt verbalized understanding.

## 2022-05-26 NOTE — Telephone Encounter (Signed)
Follow Up:     Patient would like to know if his Echo results are ready from Tuesday(05-24-22)?

## 2022-05-30 ENCOUNTER — Encounter: Payer: Self-pay | Admitting: Physical Therapy

## 2022-05-30 ENCOUNTER — Telehealth: Payer: Self-pay

## 2022-05-30 ENCOUNTER — Ambulatory Visit: Payer: Medicare Other | Admitting: Physical Therapy

## 2022-05-30 DIAGNOSIS — R29898 Other symptoms and signs involving the musculoskeletal system: Secondary | ICD-10-CM

## 2022-05-30 DIAGNOSIS — R2681 Unsteadiness on feet: Secondary | ICD-10-CM

## 2022-05-30 DIAGNOSIS — M25511 Pain in right shoulder: Secondary | ICD-10-CM

## 2022-05-30 DIAGNOSIS — M62830 Muscle spasm of back: Secondary | ICD-10-CM | POA: Diagnosis not present

## 2022-05-30 DIAGNOSIS — M75101 Unspecified rotator cuff tear or rupture of right shoulder, not specified as traumatic: Secondary | ICD-10-CM | POA: Diagnosis not present

## 2022-05-30 DIAGNOSIS — W19XXXA Unspecified fall, initial encounter: Secondary | ICD-10-CM

## 2022-05-30 DIAGNOSIS — R531 Weakness: Secondary | ICD-10-CM

## 2022-05-30 NOTE — Telephone Encounter (Signed)
Remote transmission received and reviewed. No episodes noted. Normal device function. PVC noted on presenting. Patient called and updated and appreciative of call. Advised if he had further questions or concerns to call back. Voiced understanding.

## 2022-05-30 NOTE — Therapy (Signed)
OUTPATIENT PHYSICAL THERAPY THORACOLUMBAR TREATMENT   Patient Name: Timothy Moran MRN: [id number] DOB:[birth year], 76 y.o., male Today's Date: 05/30/2022   PT End of Session - 05/30/22 1112     Visit Number 5    Date for PT Re-Evaluation 07/20/22    PT Start Time 1102    PT Stop Time 1142    PT Time Calculation (min) 40 min    Activity Tolerance Patient tolerated treatment well    Behavior During Therapy [facility] for tasks assessed/performed                 Past Medical History:  Diagnosis Date   Acute blood loss anemia 10/05/2020   Acute respiratory failure ([place]), hypothermia therapy, vent - extubated 10/06/20    AKI (acute kidney injury) ([place]) 10/05/2020   Anoxic brain injury ([place]) 10/26/2020   Arrhythmia    Ascending aortic aneurysm ([place]) 01/29/2018   a. 2019 4.3 cm by echo; b. 02/2021 Echo: Ao root 21m.   Benign brain tumor ([place]    Benign neoplasm of brain ([place] 12/19/2017   Cardiac arrest with ventricular fibrillation ([place]    Chest pain 12/04/2017   Chronic kidney disease, stage III (moderate) ([place] 12/19/2017   CKD (chronic kidney disease), stage III - IV (HCC)    Coronary Artery Disease    a. 14/4010Inf STEMI complicated by cardiac arrest/PCI: LAD 50p/m, LCX 100d (2.5 x 30 Resolute Onyx DES); b. 04/2021 PCI: 04/2021 LM nl, LAD 50p/847m2.5x30 Onyx Frontier DES), LCX 25p, 5074mFR 0.98), 40d ISR (RFR 0.98), OM2 25, RCA mild diff dzs.   Diabetes mellitus, type 2 ([place]1/25/2022   10/06/20 A1C 7%   Dizziness 10/11/2019   Encounter for imaging study to confirm orogastric (OG) tube placement    Hematuria 10/26/2020   HFrEF (heart failure with reduced ejection fraction) ([place]  a. 11/2020 Echo: EF 25-30%; b. 02/2021 Echo: EF 25-30%, glob HK. Mild LVH. Nl RV fxn. Triv AI. Ao root 34m3m History of pulmonary embolus (PE)    HLD (hyperlipidemia) 10/26/2020   Hypertension    Iron deficiency anemia rec'd IV iron 10/26/2020   Ischemic cardiomyopathy    a. 11/2020 Echo:  EF 25-30%; b. 02/2021 Echo: EF 25-30%, glob HK.   Left bundle branch block 12/19/2017   Meningioma (HCC)[place]/11/2019   Nonsustained ventricular tachycardia (HCC)[place]/29/2021   Pain of left hip joint 05/29/2020   Personal history of pulmonary embolism 12/19/2017   Pneumonia of both lungs due to infectious organism    Pulmonary hypertension due to thromboembolism (HCC)[place]/28/2019   See echo 12/27/17 with nl PAS vs CTa chest 02/23/18    S/P angioplasty with stent 10/04/20 DES to LCX  10/26/2020   Sinus bradycardia 12/22/2017   SOB (shortness of breath)    Solitary pulmonary nodule on lung CT 03/08/2018   CT 12/04/17 1.0 x 0.8 x 0.7 cm nodular opacity in the RUL vs not seen 03/16/10 posterior segment of the right upper lobe. .SpiMarland Kitchenometry 03/08/2018    FEV1 3.86 (108%)  Ratio 96 s prior rx  - PET  03/13/18   Low grade c/w adenoca > rec T surgery eval     STEMI (ST elevation myocardial infarction) (HCC)[place]/23/2022   Urinary retention 10/26/2020   Vestibular schwannoma (HCC)[place]/11/2019   Past Surgical History:  Procedure Laterality Date   APPENDECTOMY     BIV ICD INSERTION CRT-D N/A 11/03/2021   Procedure: BIV ICD INSERTION CRT-D;  Surgeon: CamnConstance Haw  MD;  Location: [place] [facility];  Service: Cardiovascular;  Laterality: N/A;   CARDIAC CATHETERIZATION     CORONARY ANGIOPLASTY WITH STENT PLACEMENT Left 04/12/2021   LAD stent placement   CORONARY STENT INTERVENTION N/A 04/12/2021   Procedure: CORONARY STENT INTERVENTION;  Surgeon: Nelva Bush, MD;  Location: [place] [facility];  Service: Cardiovascular;  Laterality: N/A;   CORONARY/GRAFT ACUTE MI REVASCULARIZATION N/A 10/04/2020   Procedure: Coronary/Graft Acute MI Revascularization;  Surgeon: Nelva Bush, MD;  Location: [place] [facility];  Service: Cardiovascular;  Laterality: N/A;   HERNIA REPAIR     INTRAVASCULAR PRESSURE WIRE/FFR STUDY N/A 04/12/2021   Procedure: INTRAVASCULAR PRESSURE WIRE/FFR STUDY;  Surgeon: Nelva Bush, MD;   Location: [place] [facility];  Service: Cardiovascular;  Laterality: N/A;   INTRAVASCULAR ULTRASOUND/IVUS N/A 04/12/2021   Procedure: Intravascular Ultrasound/IVUS;  Surgeon: Nelva Bush, MD;  Location: [place] [facility];  Service: Cardiovascular;  Laterality: N/A;   LEFT HEART CATH AND CORONARY ANGIOGRAPHY N/A 10/04/2020   Procedure: LEFT HEART CATH AND CORONARY ANGIOGRAPHY;  Surgeon: Nelva Bush, MD;  Location: [place] [facility];  Service: Cardiovascular;  Laterality: N/A;   RIGHT/LEFT HEART CATH AND CORONARY ANGIOGRAPHY N/A 04/12/2021   Procedure: RIGHT/LEFT HEART CATH AND CORONARY ANGIOGRAPHY;  Surgeon: Nelva Bush, MD;  Location: [place] [facility];  Service: Cardiovascular;  Laterality: N/A;   Patient Active Problem List   Diagnosis Date Noted   Anemia    Leukocytosis    Right arm weakness 01/25/2022   Right sided weakness    Fall at home 01/22/2022   Status post biventricular pacemaker - MRI compatible PPM and leads 01/22/2022   Chronic indwelling Foley catheter 01/22/2022   CKD (chronic kidney disease), stage III - IV ([place]) 06/28/2021   Chronic HFrEF (heart failure with reduced ejection fraction) ([place]) 03/31/2021   Arrhythmia    DM (diabetes mellitus), type 2 ([place]) new 10/26/2020   Urinary retention 10/26/2020   Hyperlipidemia LDL goal <70 10/26/2020   Coronary artery disease of native artery of native heart with stable angina pectoris ([place]) 10/26/2020   S/P angioplasty with stent 10/04/20 DES to LCX  10/26/2020   Ischemic cardiomyopathy 10/26/2020   Diabetes mellitus, type 2 ([place]) 10/06/2020   DOE (dyspnea on exertion)    Meningioma ([place]) 08/14/2020   Vestibular schwannoma ([place]) 08/14/2020   Cardiac arrest with ventricular fibrillation (HCC)    Pain of left hip joint 05/29/2020   Benign brain tumor ([place])    Chronic kidney disease    History of pulmonary embolus (PE)    Hypertension    Nonsustained ventricular tachycardia ([place]) 10/11/2019   Dizziness  10/11/2019   Abnormal echocardiogram 06/07/2019   Pulmonary hypertension due to thromboembolism ([place]) 03/09/2018   Solitary pulmonary nodule on lung CT 03/08/2018   Ascending aortic aneurysm (HCC) 4.2 cm based on CT from December 2020 01/29/2018   Sinus bradycardia 12/22/2017   Left bundle branch block 12/19/2017   Personal history of pulmonary embolism - no longer on anticoagulation due to pt refusal to take it anymore. 12/19/2017   Chronic kidney disease, stage III (moderate) ([place]) 12/19/2017   Benign neoplasm of brain ([place]) 12/19/2017    PCP: Cari Caraway, MD   REFERRING PROVIDER: Cari Caraway, MD   REFERRING DIAG: Diagnosis [phone] (ICD-10-CM) - Muscle spasm of back M75.101 (ICD-10-CM) - Unspecified rotator cuff tear or rupture of right shoulder, not specified as traumatic   Rationale for Evaluation and Treatment Rehabilitation  THERAPY DIAG:  Right shoulder  pain, unspecified chronicity  Right arm weakness  Poor body mechanics  Fall in home, initial encounter  Unsteadiness on feet  Right sided weakness  ONSET DATE: 05/10/2022   SUBJECTIVE:                                                                                                                                                                                           SUBJECTIVE STATEMENT:  I'm dizzy I'm surprised at how low my BP is. Its been down to 100/60. They may go back and take out the other 2 meningiomas at [facility] not sure what they're going to do this time. I still have waist pain, the past fall I took I had a bloody bruise on my head for a couple of weeks, they've checked my neck for damage after that fall too but I have pain in my hip and spine. Dizziness is low now but it gets high with position changes.   PERTINENT HISTORY:  Brief HPI:   Guilford Shannahan is a 76 y.o. male who fell backward striking his head and right neck shoulder area on 01/21/2022. He complained of right hemiparesis. CT head  revealed left parafalcine meningioma.Started on Decadron with improvement in symptoms   PAIN:  Are you having pain? Yes: NPRS scale: 4/10 Pain location: B low back, no radiating Pain description: sharp, grabbing Aggravating factors: movement Relieving factors: sitting still,    PRECAUTIONS: None  WEIGHT BEARING RESTRICTIONS No  FALLS:  Has patient fallen in last 6 months? Yes. Number of falls 2  LIVING ENVIRONMENT: Lives with: lives with their spouse Lives in: House/apartment Stairs: Yes: External: 7 steps; bilateral but cannot reach both Has following equipment at home: Gilford Rile - 2 wheeled  OCCUPATION: Retired  PLOF: Independent  PATIENT GOALS Feel normal with standing and functioning.   OBJECTIVE:   DIAGNOSTIC FINDINGS:  CT Lumbar- 3. Moderate degenerative spinal stenosis at L2-3. CT CERVICAL IMPRESSION: 1. No evidence of intracranial injury. 2. Negative for cervical spine fracture but there is prevertebral edema at C4-5 suggesting soft tissue injury. 3. Meningiomas with left cerebral of edema, reference brain MRI 01/22/2022.  PATIENT SURVEYS:  FOTO 63.1  SCREENING FOR RED FLAGS: Bowel or bladder incontinence: No Spinal tumors: No Cauda equina syndrome: No Compression fracture: No Abdominal aneurysm: Yes: Abdominal aortic aneurysm, stable  COGNITION:  Overall cognitive status: Within functional limits for tasks assessed and Patient appeared slightly more confused about the details of his injury at this time compared to his last visit.      SENSATION: WFL Patient reports tingling in B hands, all over.  MUSCLE LENGTH: Hamstrings: approximately 70  degrees B.  POSTURE: rounded shoulders and forward head  PALPATION: No TTP in R shoulder or up traps. B up traps tight, low back TTP R and L proximal to Illiac crest  LUMBAR ROM:  AROM limited in all directions, but he lost his balance frequently, so accuracy of testing limited.   LOWER EXTREMITY ROM:       General mild stiffness throughout BLE with PROM.  LOWER EXTREMITY MMT:  grossly 4+/5   LUMBAR SPECIAL TESTS:  Slump test: Positive R  Shoulder Special Tests empty can test (+) R, Hawkins-Kennedy test (+) R. Previous MRI reveals full tear of supraspinatus and partial tear of infraspinatus.  FUNCTIONAL TESTS:  5 times sit to stand: 27.07 Timed up and go (TUG): TBD Functional gait assessment: TBD  GAIT: Distance walked: 80 Assistive device utilized: Walker - 2 wheeled Level of assistance: SBA Comments: Patient was unsteady with turns, relied on UE support for balance.    TODAY  9/18  Orthostatic re-testing: - supine 113/64 HR 56 - sitting 109/79  HR 70 -standing 108/69 HR 58 -standing x3 minutes 110/76 HR 43  Manual check of HR 48PM, regularly irregular   Balance training:  -  tandem stance in // bars 3x30 seconds B ModA  - narrow BOS 3x30 seconds MinA for balance   HR 56 BPM at EOS, minimal dizziness   05/25/22 Recumbent bike L5 x 6 minutes. Supine strength with Physioball, bridging, bridge with hip IR x 10 reps each, roll ball side to side while in bridge x 5 reps. Supine clamshells against Green Tband resistance. Standing B side to side step x 10 with Green Tband resistance around ankles. Wall stars, 3 points x 5 with Yellow Tband resistance, B PROM for R shoulder flex, ext, ER, Abd Wall slides into shouldre flexion x 10 reps  05/23/22 NuStep L5 x 6 minutes Orthostatic BP sitting 121/73, 56 BPM, Immediate standing 92/50, 54 BPM, symptomatic. Symptoms did not resolve, but BP monitor malfunctioned at 3 minutes. Standing hip flexion and heel raises with minimal UE support on RW 10 each Standing side to side step x 10, then forward lunges alternating each leg x 5  Ambulated 1 x 120' to car with CGA using gait belt, mild unsteadiness noted, especially upon first rising.  5xSTS TUG FGA  LE strength   Nustep L3 x11mns UE and LE  Rows and ext red TB 2x10      PATIENT EDUCATION:  Education details: POC, further assessment on next visit Person educated: Patient and Spouse Education method: Explanation Education comprehension: verbalized understanding   HOME EXERCISE PROGRAM: [id number]  ASSESSMENT:  CLINICAL IMPRESSION:  RRayann Hemanarrives doing OK today, still complaining of dizziness that limits his mobility at home with positional changes- rechecked orthostatics today which were OK but HR was irregularly irregular and bradycardic (unusual for having had pace maker placed). Education provided as appropriate regarding these findings. Otherwise worked on bHotel managertoday. Will continue to monitor closely. Also messaged cardiologist via Epic secure messenger.    OBJECTIVE IMPAIRMENTS Abnormal gait, decreased activity tolerance, decreased balance, decreased cognition, decreased coordination, difficulty walking, decreased ROM, decreased strength, increased muscle spasms, impaired flexibility, postural dysfunction, and pain.   ACTIVITY LIMITATIONS carrying, lifting, bending, standing, squatting, stairs, reach over head, and locomotion level  PARTICIPATION LIMITATIONS: cleaning, laundry, driving, shopping, and community activity  PERSONAL FACTORS Age and 1 comorbidity: multiple meningiomas  are also affecting patient's functional outcome.   REHAB POTENTIAL: Good  CLINICAL DECISION MAKING: Evolving/moderate complexity  EVALUATION COMPLEXITY: Moderate   GOALS: Goals reviewed with patient? Yes  SHORT TERM GOALS: Target date: 06/08/2022  I with initial HEP Baseline: Goal status: ongoing  LONG TERM GOALS: Target date: 07/20/2022  I with final HEP Baseline:  Goal status: INITIAL  2.  Patient will achieve 120 degrees of active shoulder elevation with pain < 3/10 Baseline: 103, pain 4/10  Goal status: ongoing  3.  Complete 5 x STS in <12 sec to demonstrate improved LE strength Baseline: 27.07s Goal status: INITIAL  4.  Patient  will complete TUG in < 12 sec with or without AD Baseline: 21.73s Goal status: INITIAL  5.  Patient will score at least 18/24 on FGA (Cannot attempt heel/toe gait) Baseline: 13/24 w/RW  Goal status: INITIAL  6.  Patient will walk at least 600' with LRAD, MI on level an unlevel surfaces, back pain < 3/10 Baseline: 80', unsteady, RW Goal status: INITIAL   PLAN: PT FREQUENCY: 2x/week  PT DURATION: 10 weeks  PLANNED INTERVENTIONS: Therapeutic exercises, Therapeutic activity, Neuromuscular re-education, Balance training, Gait training, Patient/Family education, Self Care, Joint mobilization, Stair training, Dry Needling, Electrical stimulation, Cryotherapy, Moist heat, Ultrasound, Ionotophoresis '4mg'$ /ml Dexamethasone, and Manual therapy.  PLAN FOR NEXT SESSION: shoulder exercises (flexion, rows, ext) gait and balance training. Monitor HR- has been significantly bradycardic.    Ronnette Rump U PT DPT PN2  05/30/2022, 11:48 AM   05/30/2022, 11:48 AM

## 2022-05-30 NOTE — Telephone Encounter (Signed)
Called patient to send manual transmission  Patient has MDT ICD and LRL is set at 50 bpm.   Explained to patient that PVC's can cause the rate to fluctuate on a monitor although his heart rate will not go below 50 bpm.   Attempted twice to send remote transmission and todays date is present. Advised patient we will allow sometime for the monitor to transmit and I will call back to f/u. Patient agreeable to plan.

## 2022-05-30 NOTE — Telephone Encounter (Signed)
-----   Message from [facility], [place] sent at 05/30/2022 12:21 PM EDT ----- Patient toay at physical therapy, reportedly has pulses in the 40's (probably ectopy), can you please follow up with him and check a remote?   [place], can you have him see either WC or [name]?  I have seen him a couple times now and I don't seem to be helping...   thanks

## 2022-05-31 DIAGNOSIS — N1832 Chronic kidney disease, stage 3b: Secondary | ICD-10-CM | POA: Diagnosis not present

## 2022-05-31 DIAGNOSIS — N179 Acute kidney failure, unspecified: Secondary | ICD-10-CM | POA: Diagnosis not present

## 2022-05-31 DIAGNOSIS — I2510 Atherosclerotic heart disease of native coronary artery without angina pectoris: Secondary | ICD-10-CM | POA: Diagnosis not present

## 2022-05-31 DIAGNOSIS — N400 Enlarged prostate without lower urinary tract symptoms: Secondary | ICD-10-CM | POA: Diagnosis not present

## 2022-05-31 DIAGNOSIS — N189 Chronic kidney disease, unspecified: Secondary | ICD-10-CM | POA: Diagnosis not present

## 2022-05-31 DIAGNOSIS — D631 Anemia in chronic kidney disease: Secondary | ICD-10-CM | POA: Diagnosis not present

## 2022-05-31 DIAGNOSIS — I5022 Chronic systolic (congestive) heart failure: Secondary | ICD-10-CM | POA: Diagnosis not present

## 2022-05-31 DIAGNOSIS — I129 Hypertensive chronic kidney disease with stage 1 through stage 4 chronic kidney disease, or unspecified chronic kidney disease: Secondary | ICD-10-CM | POA: Diagnosis not present

## 2022-06-01 ENCOUNTER — Telehealth: Payer: Self-pay

## 2022-06-01 DIAGNOSIS — L600 Ingrowing nail: Secondary | ICD-10-CM | POA: Diagnosis not present

## 2022-06-01 DIAGNOSIS — B351 Tinea unguium: Secondary | ICD-10-CM | POA: Diagnosis not present

## 2022-06-01 DIAGNOSIS — M79674 Pain in right toe(s): Secondary | ICD-10-CM | POA: Diagnosis not present

## 2022-06-01 DIAGNOSIS — M79675 Pain in left toe(s): Secondary | ICD-10-CM | POA: Diagnosis not present

## 2022-06-01 NOTE — Telephone Encounter (Signed)
Patient notified of results.

## 2022-06-01 NOTE — Telephone Encounter (Signed)
-----   Message from Park Liter, MD sent at 05/31/2022  7:48 PM EDT ----- Echocardiogram showed unchanged ejection fraction, continue present management

## 2022-06-02 ENCOUNTER — Ambulatory Visit: Payer: Medicare Other | Admitting: Physical Therapy

## 2022-06-02 ENCOUNTER — Encounter: Payer: Self-pay | Admitting: Physical Therapy

## 2022-06-02 DIAGNOSIS — M75101 Unspecified rotator cuff tear or rupture of right shoulder, not specified as traumatic: Secondary | ICD-10-CM | POA: Diagnosis not present

## 2022-06-02 DIAGNOSIS — R531 Weakness: Secondary | ICD-10-CM

## 2022-06-02 DIAGNOSIS — R278 Other lack of coordination: Secondary | ICD-10-CM

## 2022-06-02 DIAGNOSIS — M6281 Muscle weakness (generalized): Secondary | ICD-10-CM

## 2022-06-02 DIAGNOSIS — R2681 Unsteadiness on feet: Secondary | ICD-10-CM

## 2022-06-02 DIAGNOSIS — Y92009 Unspecified place in unspecified non-institutional (private) residence as the place of occurrence of the external cause: Secondary | ICD-10-CM

## 2022-06-02 DIAGNOSIS — M62830 Muscle spasm of back: Secondary | ICD-10-CM | POA: Diagnosis not present

## 2022-06-02 DIAGNOSIS — D329 Benign neoplasm of meninges, unspecified: Secondary | ICD-10-CM

## 2022-06-02 DIAGNOSIS — R29898 Other symptoms and signs involving the musculoskeletal system: Secondary | ICD-10-CM

## 2022-06-02 NOTE — Therapy (Signed)
OUTPATIENT PHYSICAL THERAPY THORACOLUMBAR TREATMENT   Patient Name: Timothy Moran MRN: [id number] DOB:June 17, 1946, 76 y.o., male Today's Date: 06/02/2022   PT End of Session - 06/02/22 1104     Visit Number 6    Date for PT Re-Evaluation 07/20/22    PT Start Time 1101    PT Stop Time 1140    PT Time Calculation (min) 39 min    Activity Tolerance Patient tolerated treatment well    Behavior During Therapy [facility] for tasks assessed/performed                  Past Medical History:  Diagnosis Date   Acute blood loss anemia 10/05/2020   Acute respiratory failure ([place]), hypothermia therapy, vent - extubated 10/06/20    AKI (acute kidney injury) ([place]) 10/05/2020   Anoxic brain injury ([place]) 10/26/2020   Arrhythmia    Ascending aortic aneurysm ([place]) 01/29/2018   a. 2019 4.3 cm by echo; b. 02/2021 Echo: Ao root 72m.   Benign brain tumor ([facility]    Benign neoplasm of brain ([place] 12/19/2017   Cardiac arrest with ventricular fibrillation ([place]    Chest pain 12/04/2017   Chronic kidney disease, stage III (moderate) ([place] 12/19/2017   CKD (chronic kidney disease), stage III - IV (HCC)    Coronary Artery Disease    a. 13/3612Inf STEMI complicated by cardiac arrest/PCI: LAD 50p/m, LCX 100d (2.5 x 30 Resolute Onyx DES); b. 04/2021 PCI: 04/2021 LM nl, LAD 50p/871m2.5x30 Onyx Frontier DES), LCX 25p, 5054mFR 0.98), 40d ISR (RFR 0.98), OM2 25, RCA mild diff dzs.   Diabetes mellitus, type 2 ([facility]1/25/2022   10/06/20 A1C 7%   Dizziness 10/11/2019   Encounter for imaging study to confirm orogastric (OG) tube placement    Hematuria 10/26/2020   HFrEF (heart failure with reduced ejection fraction) ([place]  a. 11/2020 Echo: EF 25-30%; b. 02/2021 Echo: EF 25-30%, glob HK. Mild LVH. Nl RV fxn. Triv AI. Ao root 17m17m History of pulmonary embolus (PE)    HLD (hyperlipidemia) 10/26/2020   Hypertension    Iron deficiency anemia rec'd IV iron 10/26/2020   Ischemic cardiomyopathy    a. 11/2020  Echo: EF 25-30%; b. 02/2021 Echo: EF 25-30%, glob HK.   Left bundle branch block 12/19/2017   Meningioma (HCC)[place]/11/2019   Nonsustained ventricular tachycardia (HCC)[place]/29/2021   Pain of left hip joint 05/29/2020   Personal history of pulmonary embolism 12/19/2017   Pneumonia of both lungs due to infectious organism    Pulmonary hypertension due to thromboembolism (HCC)[place]/28/2019   See echo 12/27/17 with nl PAS vs CTa chest 02/23/18    S/P angioplasty with stent 10/04/20 DES to LCX  10/26/2020   Sinus bradycardia 12/22/2017   SOB (shortness of breath)    Solitary pulmonary nodule on lung CT 03/08/2018   CT 12/04/17 1.0 x 0.8 x 0.7 cm nodular opacity in the RUL vs not seen 03/16/10 posterior segment of the right upper lobe. .SpiMarland Kitchenometry 03/08/2018    FEV1 3.86 (108%)  Ratio 96 s prior rx  - PET  03/13/18   Low grade c/w adenoca > rec T surgery eval     STEMI (ST elevation myocardial infarction) (HCC)[place]/23/2022   Urinary retention 10/26/2020   Vestibular schwannoma (HCC)[place]/11/2019   Past Surgical History:  Procedure Laterality Date   APPENDECTOMY     BIV ICD INSERTION CRT-D N/A 11/03/2021   Procedure: BIV ICD INSERTION CRT-D;  Surgeon: CamnCurt Bearsll  Hassell Done, MD;  Location: [place] [facility];  Service: Cardiovascular;  Laterality: N/A;   CARDIAC CATHETERIZATION     CORONARY ANGIOPLASTY WITH STENT PLACEMENT Left 04/12/2021   LAD stent placement   CORONARY STENT INTERVENTION N/A 04/12/2021   Procedure: CORONARY STENT INTERVENTION;  Surgeon: Nelva Bush, MD;  Location: [place] [facility];  Service: Cardiovascular;  Laterality: N/A;   CORONARY/GRAFT ACUTE MI REVASCULARIZATION N/A 10/04/2020   Procedure: Coronary/Graft Acute MI Revascularization;  Surgeon: Nelva Bush, MD;  Location: [place] [facility];  Service: Cardiovascular;  Laterality: N/A;   HERNIA REPAIR     INTRAVASCULAR PRESSURE WIRE/FFR STUDY N/A 04/12/2021   Procedure: INTRAVASCULAR PRESSURE WIRE/FFR STUDY;  Surgeon: Nelva Bush, MD;  Location: [place] [facility];  Service: Cardiovascular;  Laterality: N/A;   INTRAVASCULAR ULTRASOUND/IVUS N/A 04/12/2021   Procedure: Intravascular Ultrasound/IVUS;  Surgeon: Nelva Bush, MD;  Location: [place] [facility];  Service: Cardiovascular;  Laterality: N/A;   LEFT HEART CATH AND CORONARY ANGIOGRAPHY N/A 10/04/2020   Procedure: LEFT HEART CATH AND CORONARY ANGIOGRAPHY;  Surgeon: Nelva Bush, MD;  Location: [place] [facility];  Service: Cardiovascular;  Laterality: N/A;   RIGHT/LEFT HEART CATH AND CORONARY ANGIOGRAPHY N/A 04/12/2021   Procedure: RIGHT/LEFT HEART CATH AND CORONARY ANGIOGRAPHY;  Surgeon: Nelva Bush, MD;  Location: [place] [facility];  Service: Cardiovascular;  Laterality: N/A;   Patient Active Problem List   Diagnosis Date Noted   Anemia    Leukocytosis    Right arm weakness 01/25/2022   Right sided weakness    Fall at home 01/22/2022   Status post biventricular pacemaker - MRI compatible PPM and leads 01/22/2022   Chronic indwelling Foley catheter 01/22/2022   CKD (chronic kidney disease), stage III - IV ([place]) 06/28/2021   Chronic HFrEF (heart failure with reduced ejection fraction) ([place]) 03/31/2021   Arrhythmia    DM (diabetes mellitus), type 2 ([place]) new 10/26/2020   Urinary retention 10/26/2020   Hyperlipidemia LDL goal <70 10/26/2020   Coronary artery disease of native artery of native heart with stable angina pectoris ([place]) 10/26/2020   S/P angioplasty with stent 10/04/20 DES to LCX  10/26/2020   Ischemic cardiomyopathy 10/26/2020   Diabetes mellitus, type 2 ([place]) 10/06/2020   DOE (dyspnea on exertion)    Meningioma ([place]) 08/14/2020   Vestibular schwannoma ([place]) 08/14/2020   Cardiac arrest with ventricular fibrillation (HCC)    Pain of left hip joint 05/29/2020   Benign brain tumor ([place])    Chronic kidney disease    History of pulmonary embolus (PE)    Hypertension    Nonsustained ventricular tachycardia ([place]) 10/11/2019    Dizziness 10/11/2019   Abnormal echocardiogram 06/07/2019   Pulmonary hypertension due to thromboembolism ([place]) 03/09/2018   Solitary pulmonary nodule on lung CT 03/08/2018   Ascending aortic aneurysm (HCC) 4.2 cm based on CT from December 2020 01/29/2018   Sinus bradycardia 12/22/2017   Left bundle branch block 12/19/2017   Personal history of pulmonary embolism - no longer on anticoagulation due to pt refusal to take it anymore. 12/19/2017   Chronic kidney disease, stage III (moderate) ([place]) 12/19/2017   Benign neoplasm of brain ([place]) 12/19/2017    PCP: Cari Caraway, MD   REFERRING PROVIDER: Cari Caraway, MD   REFERRING DIAG: Diagnosis [phone] (ICD-10-CM) - Muscle spasm of back M75.101 (ICD-10-CM) - Unspecified rotator cuff tear or rupture of right shoulder, not specified as traumatic   Rationale for Evaluation and Treatment Rehabilitation  THERAPY DIAG:  Poor  body mechanics  Fall in home, initial encounter  Unsteadiness on feet  Right sided weakness  Other lack of coordination  Muscle weakness (generalized)  Meningioma ([place])  ONSET DATE: 05/10/2022   SUBJECTIVE:                                                                                                                                                                                           SUBJECTIVE STATEMENT: Patient reports continued dizziness. He has a Dr appointment in about a month to address the meningiomas in his brain. His BP remains low and he has dizziness when he first stands up, but he has learned to wait and the dizziness goes away. He also gets dizziness if he turns his head.  PERTINENT HISTORY:  Brief HPI:   Ezekiah Massie is a 76 y.o. male who fell backward striking his head and right neck shoulder area on 01/21/2022. He complained of right hemiparesis. CT head revealed left parafalcine meningioma.Started on Decadron with improvement in symptoms   PAIN:  Are you having pain? Yes: NPRS  scale: 4/10 Pain location: B low back, no radiating Pain description: sharp, grabbing Aggravating factors: movement Relieving factors: sitting still,    PRECAUTIONS: None  WEIGHT BEARING RESTRICTIONS No  FALLS:  Has patient fallen in last 6 months? Yes. Number of falls 2  LIVING ENVIRONMENT: Lives with: lives with their spouse Lives in: House/apartment Stairs: Yes: External: 7 steps; bilateral but cannot reach both Has following equipment at home: Gilford Rile - 2 wheeled  OCCUPATION: Retired  PLOF: Independent  PATIENT GOALS Feel normal with standing and functioning.   OBJECTIVE:   DIAGNOSTIC FINDINGS:  CT Lumbar- 3. Moderate degenerative spinal stenosis at L2-3. CT CERVICAL IMPRESSION: 1. No evidence of intracranial injury. 2. Negative for cervical spine fracture but there is prevertebral edema at C4-5 suggesting soft tissue injury. 3. Meningiomas with left cerebral of edema, reference brain MRI 01/22/2022.  PATIENT SURVEYS:  FOTO 63.1  SCREENING FOR RED FLAGS: Bowel or bladder incontinence: No Spinal tumors: No Cauda equina syndrome: No Compression fracture: No Abdominal aneurysm: Yes: Abdominal aortic aneurysm, stable  COGNITION:  Overall cognitive status: Within functional limits for tasks assessed and Patient appeared slightly more confused about the details of his injury at this time compared to his last visit.      SENSATION: WFL Patient reports tingling in B hands, all over.  MUSCLE LENGTH: Hamstrings: approximately 70 degrees B.  POSTURE: rounded shoulders and forward head  PALPATION: No TTP in R shoulder or up traps. B up traps tight, low back TTP R and L proximal to Illiac crest  LUMBAR ROM:  AROM limited in all directions, but he lost his balance frequently, so accuracy of testing limited.   LOWER EXTREMITY ROM:      General mild stiffness throughout BLE with PROM.  LOWER EXTREMITY MMT:  grossly 4+/5   LUMBAR SPECIAL TESTS:  Slump test:  Positive R  Shoulder Special Tests empty can test (+) R, Hawkins-Kennedy test (+) R. Previous MRI reveals full tear of supraspinatus and partial tear of infraspinatus.  FUNCTIONAL TESTS:  5 times sit to stand: 27.07 Timed up and go (TUG): TBD Functional gait assessment: TBD  GAIT: Distance walked: 80 Assistive device utilized: Walker - 2 wheeled Level of assistance: SBA Comments: Patient was unsteady with turns, relied on UE support for balance.    TODAY 06/02/22 NuStep L5 x 6 minutes legs only Occular screen-  Spontaneous nystagmus (-) Visual fields WNL Smooth pursuit WNL Saccades horiz and vert- WNL Vestibular assessment VOR mildly positive- assessment was modified due to pain In his neck with head turning. Stands 3 x 30 sec- mildly unsteady, but no LOB, eyes closed, very unstable, LOB < 10 sec Stands on Airex-1 x 15 sec, 1 x 30 sec, unsteady. Eyes closed deferred. Patient education provided  9/18 Orthostatic re-testing: - supine 113/64 HR 56 - sitting 109/79  HR 70 -standing 108/69 HR 58 -standing x3 minutes 110/76 HR 43  Manual check of HR 48PM, regularly irregular   Balance training:  -  tandem stance in // bars 3x30 seconds B ModA  - narrow BOS 3x30 seconds MinA for balance   HR 56 BPM at EOS, minimal dizziness   05/25/22 Recumbent bike L5 x 6 minutes. Supine strength with Physioball, bridging, bridge with hip IR x 10 reps each, roll ball side to side while in bridge x 5 reps. Supine clamshells against Green Tband resistance. Standing B side to side step x 10 with Green Tband resistance around ankles. Wall stars, 3 points x 5 with Yellow Tband resistance, B PROM for R shoulder flex, ext, ER, Abd Wall slides into shouldre flexion x 10 reps  05/23/22 NuStep L5 x 6 minutes Orthostatic BP sitting 121/73, 56 BPM, Immediate standing 92/50, 54 BPM, symptomatic. Symptoms did not resolve, but BP monitor malfunctioned at 3 minutes. Standing hip flexion and heel  raises with minimal UE support on RW 10 each Standing side to side step x 10, then forward lunges alternating each leg x 5  Ambulated 1 x 120' to car with CGA using gait belt, mild unsteadiness noted, especially upon first rising.  5xSTS TUG FGA  LE strength   Nustep L3 x27mns UE and LE  Rows and ext red TB 2x10     PATIENT EDUCATION:  Education details: 06/02/22 Vestibular-assessment points to some central vestibular impairment which is most likely associated with his meningioma. Person educated: Patient and Spouse Education method: Explanation Education comprehension: verbalized understanding   HOME EXERCISE PROGRAM: [place] ASSESSMENT:  CLINICAL IMPRESSION: Patient reports continued dizziness and unsteadiness. His BP may be influencing his dizziness, but he also reports increased dizziness with head turns, and double vision. Further vestibular assessment attemtped, limited by neck pain, but he does appear to rely on his vision for his balance. He then noted that he is very unsteady at night when trying to walk to bathroom in the dark. Educated him to using a nightlight. He has an appointment for F/U with his Meningioma in about a month.   OBJECTIVE IMPAIRMENTS Abnormal gait, decreased activity tolerance, decreased balance, decreased cognition, decreased coordination, difficulty  walking, decreased ROM, decreased strength, increased muscle spasms, impaired flexibility, postural dysfunction, and pain.   ACTIVITY LIMITATIONS carrying, lifting, bending, standing, squatting, stairs, reach over head, and locomotion level  PARTICIPATION LIMITATIONS: cleaning, laundry, driving, shopping, and community activity  PERSONAL FACTORS Age and 1 comorbidity: multiple meningiomas  are also affecting patient's functional outcome.   REHAB POTENTIAL: Good  CLINICAL DECISION MAKING: Evolving/moderate complexity  EVALUATION COMPLEXITY: Moderate   GOALS: Goals reviewed with patient?  Yes  SHORT TERM GOALS: Target date: 06/08/2022  I with initial HEP Baseline: Goal status: ongoing  LONG TERM GOALS: Target date: 07/20/2022  I with final HEP Baseline:  Goal status: INITIAL  2.  Patient will achieve 120 degrees of active shoulder elevation with pain < 3/10 Baseline: 103, pain 4/10  Goal status: ongoing  3.  Complete 5 x STS in <12 sec to demonstrate improved LE strength Baseline: 27.07s Goal status: ongoing  4.  Patient will complete TUG in < 12 sec with or without AD Baseline: 21.73s Goal status: ongoing  5.  Patient will score at least 18/24 on FGA (Cannot attempt heel/toe gait) Baseline: 13/24 w/RW  Goal status: INITIAL  6.  Patient will walk at least 600' with LRAD, MI on level an unlevel surfaces, back pain < 3/10 Baseline: 80', unsteady, RW Goal status: INITIAL   PLAN: PT FREQUENCY: 2x/week  PT DURATION: 10 weeks  PLANNED INTERVENTIONS: Therapeutic exercises, Therapeutic activity, Neuromuscular re-education, Balance training, Gait training, Patient/Family education, Self Care, Joint mobilization, Stair training, Dry Needling, Electrical stimulation, Cryotherapy, Moist heat, Ultrasound, Ionotophoresis '4mg'$ /ml Dexamethasone, and Manual therapy.  PLAN FOR NEXT SESSION: Re-assess/update HEP for shoulders, neck, balance   Ethel Rana DPT 06/02/22 12:44 PM  06/02/2022, 12:44 PM   06/02/2022, 12:44 PM

## 2022-06-03 ENCOUNTER — Ambulatory Visit: Payer: Medicare Other | Admitting: Cardiology

## 2022-06-03 NOTE — Progress Notes (Signed)
Remote ICD transmission.   

## 2022-06-06 ENCOUNTER — Encounter: Payer: Self-pay | Admitting: Physical Therapy

## 2022-06-06 ENCOUNTER — Ambulatory Visit: Payer: Medicare Other | Admitting: Physical Therapy

## 2022-06-06 DIAGNOSIS — R278 Other lack of coordination: Secondary | ICD-10-CM

## 2022-06-06 DIAGNOSIS — R531 Weakness: Secondary | ICD-10-CM

## 2022-06-06 DIAGNOSIS — D329 Benign neoplasm of meninges, unspecified: Secondary | ICD-10-CM

## 2022-06-06 DIAGNOSIS — R29898 Other symptoms and signs involving the musculoskeletal system: Secondary | ICD-10-CM

## 2022-06-06 DIAGNOSIS — M62830 Muscle spasm of back: Secondary | ICD-10-CM | POA: Diagnosis not present

## 2022-06-06 DIAGNOSIS — M6281 Muscle weakness (generalized): Secondary | ICD-10-CM

## 2022-06-06 DIAGNOSIS — M75101 Unspecified rotator cuff tear or rupture of right shoulder, not specified as traumatic: Secondary | ICD-10-CM | POA: Diagnosis not present

## 2022-06-06 DIAGNOSIS — R2681 Unsteadiness on feet: Secondary | ICD-10-CM

## 2022-06-06 DIAGNOSIS — W19XXXA Unspecified fall, initial encounter: Secondary | ICD-10-CM

## 2022-06-06 NOTE — Therapy (Signed)
OUTPATIENT PHYSICAL THERAPY THORACOLUMBAR TREATMENT   Patient Name: Timothy Moran Treat MRN: [id number] DOB:Sep 03, 1946, 76 y.o., male Today's Date: 06/06/2022   PT End of Session - 06/06/22 1101     Visit Number 7    Date for PT Re-Evaluation 07/20/22    PT Start Time 1058    PT Stop Time 1140    PT Time Calculation (min) 42 min    Equipment Utilized During Treatment Gait belt    Activity Tolerance Patient tolerated treatment well    Behavior During Therapy WFL for tasks assessed/performed                   Past Medical History:  Diagnosis Date   Acute blood loss anemia 10/05/2020   Acute respiratory failure ([place]), hypothermia therapy, vent - extubated 10/06/20    AKI (acute kidney injury) ([place]) 10/05/2020   Anoxic brain injury ([place]) 10/26/2020   Arrhythmia    Ascending aortic aneurysm ([place]) 01/29/2018   a. 2019 4.3 cm by echo; b. 02/2021 Echo: Ao root 27m.   Benign brain tumor ([facility]    Benign neoplasm of brain ([place] 12/19/2017   Cardiac arrest with ventricular fibrillation ([place]    Chest pain 12/04/2017   Chronic kidney disease, stage III (moderate) ([facility] 12/19/2017   CKD (chronic kidney disease), stage III - IV (HCC)    Coronary Artery Disease    a. 11/9379Inf STEMI complicated by cardiac arrest/PCI: LAD 50p/m, LCX 100d (2.5 x 30 Resolute Onyx DES); b. 04/2021 PCI: 04/2021 LM nl, LAD 50p/868m2.5x30 Onyx Frontier DES), LCX 25p, 5048mFR 0.98), 40d ISR (RFR 0.98), OM2 25, RCA mild diff dzs.   Diabetes mellitus, type 2 ([facility]1/25/2022   10/06/20 A1C 7%   Dizziness 10/11/2019   Encounter for imaging study to confirm orogastric (OG) tube placement    Hematuria 10/26/2020   HFrEF (heart failure with reduced ejection fraction) ([facility]  a. 11/2020 Echo: EF 25-30%; b. 02/2021 Echo: EF 25-30%, glob HK. Mild LVH. Nl RV fxn. Triv AI. Ao root 59m54m History of pulmonary embolus (PE)    HLD (hyperlipidemia) 10/26/2020   Hypertension    Iron deficiency anemia rec'd IV iron  10/26/2020   Ischemic cardiomyopathy    a. 11/2020 Echo: EF 25-30%; b. 02/2021 Echo: EF 25-30%, glob HK.   Left bundle branch block 12/19/2017   Meningioma (HCC)[place]/11/2019   Nonsustained ventricular tachycardia (HCC)[place]/29/2021   Pain of left hip joint 05/29/2020   Personal history of pulmonary embolism 12/19/2017   Pneumonia of both lungs due to infectious organism    Pulmonary hypertension due to thromboembolism (HCC)[place]/28/2019   See echo 12/27/17 with nl PAS vs CTa chest 02/23/18    S/P angioplasty with stent 10/04/20 DES to LCX  10/26/2020   Sinus bradycardia 12/22/2017   SOB (shortness of breath)    Solitary pulmonary nodule on lung CT 03/08/2018   CT 12/04/17 1.0 x 0.8 x 0.7 cm nodular opacity in the RUL vs not seen 03/16/10 posterior segment of the right upper lobe. .SpiMarland Kitchenometry 03/08/2018    FEV1 3.86 (108%)  Ratio 96 s prior rx  - PET  03/13/18   Low grade c/w adenoca > rec T surgery eval     STEMI (ST elevation myocardial infarction) (HCC)[place]/23/2022   Urinary retention 10/26/2020   Vestibular schwannoma (HCC)[place]/11/2019   Past Surgical History:  Procedure Laterality Date   APPENDECTOMY     BIV ICD INSERTION CRT-D N/A 11/03/2021  Procedure: BIV ICD INSERTION CRT-D;  Surgeon: Constance Haw, MD;  Location: [facility];  Service: Cardiovascular;  Laterality: N/A;   CARDIAC CATHETERIZATION     CORONARY ANGIOPLASTY WITH STENT PLACEMENT Left 04/12/2021   LAD stent placement   CORONARY STENT INTERVENTION N/A 04/12/2021   Procedure: CORONARY STENT INTERVENTION;  Surgeon: Nelva Bush, MD;  Location: [facility];  Service: Cardiovascular;  Laterality: N/A;   CORONARY/GRAFT ACUTE MI REVASCULARIZATION N/A 10/04/2020   Procedure: Coronary/Graft Acute MI Revascularization;  Surgeon: Nelva Bush, MD;  Location: [facility];  Service: Cardiovascular;  Laterality: N/A;   HERNIA REPAIR     INTRAVASCULAR PRESSURE WIRE/FFR STUDY N/A 04/12/2021   Procedure:  INTRAVASCULAR PRESSURE WIRE/FFR STUDY;  Surgeon: Nelva Bush, MD;  Location: [facility];  Service: Cardiovascular;  Laterality: N/A;   INTRAVASCULAR ULTRASOUND/IVUS N/A 04/12/2021   Procedure: Intravascular Ultrasound/IVUS;  Surgeon: Nelva Bush, MD;  Location: [facility];  Service: Cardiovascular;  Laterality: N/A;   LEFT HEART CATH AND CORONARY ANGIOGRAPHY N/A 10/04/2020   Procedure: LEFT HEART CATH AND CORONARY ANGIOGRAPHY;  Surgeon: Nelva Bush, MD;  Location: [facility];  Service: Cardiovascular;  Laterality: N/A;   RIGHT/LEFT HEART CATH AND CORONARY ANGIOGRAPHY N/A 04/12/2021   Procedure: RIGHT/LEFT HEART CATH AND CORONARY ANGIOGRAPHY;  Surgeon: Nelva Bush, MD;  Location: [facility];  Service: Cardiovascular;  Laterality: N/A;   Patient Active Problem List   Diagnosis Date Noted   Anemia    Leukocytosis    Right arm weakness 01/25/2022   Right sided weakness    Fall at home 01/22/2022   Status post biventricular pacemaker - MRI compatible PPM and leads 01/22/2022   Chronic indwelling Foley catheter 01/22/2022   CKD (chronic kidney disease), stage III - IV ([place]) 06/28/2021   Chronic HFrEF (heart failure with reduced ejection fraction) ([place]) 03/31/2021   Arrhythmia    DM (diabetes mellitus), type 2 ([place]) new 10/26/2020   Urinary retention 10/26/2020   Hyperlipidemia LDL goal <70 10/26/2020   Coronary artery disease of native artery of native heart with stable angina pectoris ([place]) 10/26/2020   S/P angioplasty with stent 10/04/20 DES to LCX  10/26/2020   Ischemic cardiomyopathy 10/26/2020   Diabetes mellitus, type 2 ([place]) 10/06/2020   DOE (dyspnea on exertion)    Meningioma ([place]) 08/14/2020   Vestibular schwannoma ([place]) 08/14/2020   Cardiac arrest with ventricular fibrillation (HCC)    Pain of left hip joint 05/29/2020   Benign brain tumor ([place])    Chronic kidney disease    History of pulmonary embolus (PE)    Hypertension     Nonsustained ventricular tachycardia ([place]) 10/11/2019   Dizziness 10/11/2019   Abnormal echocardiogram 06/07/2019   Pulmonary hypertension due to thromboembolism ([place]) 03/09/2018   Solitary pulmonary nodule on lung CT 03/08/2018   Ascending aortic aneurysm (HCC) 4.2 cm based on CT from December 2020 01/29/2018   Sinus bradycardia 12/22/2017   Left bundle branch block 12/19/2017   Personal history of pulmonary embolism - no longer on anticoagulation due to pt refusal to take it anymore. 12/19/2017   Chronic kidney disease, stage III (moderate) ([place]) 12/19/2017   Benign neoplasm of brain ([place]) 12/19/2017    PCP: Cari Caraway, MD   REFERRING PROVIDER: Cari Caraway, MD   REFERRING DIAG: Diagnosis 7745079347 (ICD-10-CM) - Muscle spasm of back M75.101 (ICD-10-CM) - Unspecified rotator cuff tear or rupture of right shoulder, not specified as traumatic   Rationale for  Evaluation and Treatment Rehabilitation  THERAPY DIAG:  Poor body mechanics  Fall in home, initial encounter  Unsteadiness on feet  Right sided weakness  Other lack of coordination  Muscle weakness (generalized)  Meningioma ([place])  ONSET DATE: 05/10/2022   SUBJECTIVE:                                                                                                                                                                                           SUBJECTIVE STATEMENT: Patient was unable to move his appointment up with the meningioma Dr. He feels about the same, arrives without a RW today.  PERTINENT HISTORY:  Brief HPI:   Donald Memoli is a 76 y.o. male who fell backward striking his head and right neck shoulder area on 01/21/2022. He complained of right hemiparesis. CT head revealed left parafalcine meningioma.Started on Decadron with improvement in symptoms   PAIN:  Are you having pain? Yes: NPRS scale: 4/10 Pain location: B low back, no radiating Pain description: sharp, grabbing Aggravating factors:  movement Relieving factors: sitting still,    PRECAUTIONS: None  WEIGHT BEARING RESTRICTIONS No  FALLS:  Has patient fallen in last 6 months? Yes. Number of falls 2  LIVING ENVIRONMENT: Lives with: lives with their spouse Lives in: House/apartment Stairs: Yes: External: 7 steps; bilateral but cannot reach both Has following equipment at home: Gilford Rile - 2 wheeled  OCCUPATION: Retired  PLOF: Independent  PATIENT GOALS Feel normal with standing and functioning.   OBJECTIVE:   DIAGNOSTIC FINDINGS:  CT Lumbar- 3. Moderate degenerative spinal stenosis at L2-3. CT CERVICAL IMPRESSION: 1. No evidence of intracranial injury. 2. Negative for cervical spine fracture but there is prevertebral edema at C4-5 suggesting soft tissue injury. 3. Meningiomas with left cerebral of edema, reference brain MRI 01/22/2022.  PATIENT SURVEYS:  FOTO 63.1  SCREENING FOR RED FLAGS: Bowel or bladder incontinence: No Spinal tumors: No Cauda equina syndrome: No Compression fracture: No Abdominal aneurysm: Yes: Abdominal aortic aneurysm, stable  COGNITION:  Overall cognitive status: Within functional limits for tasks assessed and Patient appeared slightly more confused about the details of his injury at this time compared to his last visit.      SENSATION: WFL Patient reports tingling in B hands, all over.  MUSCLE LENGTH: Hamstrings: approximately 70 degrees B.  POSTURE: rounded shoulders and forward head  PALPATION: No TTP in R shoulder or up traps. B up traps tight, low back TTP R and L proximal to Illiac crest  LUMBAR ROM:  AROM limited in all directions, but he lost his balance frequently, so accuracy of testing limited.   LOWER EXTREMITY ROM:  General mild stiffness throughout BLE with PROM.  LOWER EXTREMITY MMT:  grossly 4+/5   LUMBAR SPECIAL TESTS:  Slump test: Positive R  Shoulder Special Tests empty can test (+) R, Hawkins-Kennedy test (+) R. Previous MRI reveals  full tear of supraspinatus and partial tear of infraspinatus.  FUNCTIONAL TESTS:  5 times sit to stand: 27.07 Timed up and go (TUG): TBD Functional gait assessment: TBD  GAIT: Distance walked: 80 Assistive device utilized: Walker - 2 wheeled Level of assistance: SBA Comments: Patient was unsteady with turns, relied on UE support for balance.    TODAY 06/06/22 NuStep L5 x 6 minutes Updated HEP to address additional balance training Side to side quick steps x 30 sec Heel raises x 20, both at counter  Wall bumps with feet moving progressively further from the wall Step ups onto Airex pad, required UE support due to instability Ambulated while holding 4# weight in BUE, possibly slightly less instability noted during turns.   06/02/22 NuStep L5 x 6 minutes legs only Occular screen-  Spontaneous nystagmus (-) Visual fields WNL Smooth pursuit WNL Saccades horiz and vert- WNL Vestibular assessment VOR mildly positive- assessment was modified due to pain In his neck with head turning. Stands 3 x 30 sec- mildly unsteady, but no LOB, eyes closed, very unstable, LOB < 10 sec Stands on Airex-1 x 15 sec, 1 x 30 sec, unsteady. Eyes closed deferred. Patient education provided  9/18 Orthostatic re-testing: - supine 113/64 HR 56 - sitting 109/79  HR 70 -standing 108/69 HR 58 -standing x3 minutes 110/76 HR 43  Manual check of HR 48PM, regularly irregular   Balance training:  -  tandem stance in // bars 3x30 seconds B ModA  - narrow BOS 3x30 seconds MinA for balance   HR 56 BPM at EOS, minimal dizziness   05/25/22 Recumbent bike L5 x 6 minutes. Supine strength with Physioball, bridging, bridge with hip IR x 10 reps each, roll ball side to side while in bridge x 5 reps. Supine clamshells against Green Tband resistance. Standing B side to side step x 10 with Green Tband resistance around ankles. Wall stars, 3 points x 5 with Yellow Tband resistance, B PROM for R shoulder flex,  ext, ER, Abd Wall slides into shouldre flexion x 10 reps  05/23/22 NuStep L5 x 6 minutes Orthostatic BP sitting 121/73, 56 BPM, Immediate standing 92/50, 54 BPM, symptomatic. Symptoms did not resolve, but BP monitor malfunctioned at 3 minutes. Standing hip flexion and heel raises with minimal UE support on RW 10 each Standing side to side step x 10, then forward lunges alternating each leg x 5  Ambulated 1 x 120' to car with CGA using gait belt, mild unsteadiness noted, especially upon first rising.  5xSTS TUG FGA  LE strength   Nustep L3 x81mns UE and LE  Rows and ext red TB 2x10     PATIENT EDUCATION:  Education details: 06/02/22 Vestibular-assessment points to some central vestibular impairment which is most likely associated with his meningioma. Person educated: Patient and Spouse Education method: Explanation Education comprehension: verbalized understanding   HOME EXERCISE PROGRAM: [place] ASSESSMENT:  CLINICAL IMPRESSION: Patient reports continued dizziness and unsteadiness. He does not feel there is much improvement with his therapy. Therapist agrees. His unsteadiness appears to be at least in part caused by Cerebellar disruption, possibly due to his Cerebellar meningioma. He is scheduled to see the Meningioma Dr and receive some radiation next month. Updated HEP. Patient will try the balance  activities at home and check back at next appointement, which will probably be his last treatment until after he sees the meningioma Dr.   Loletta Specter IMPAIRMENTS Abnormal gait, decreased activity tolerance, decreased balance, decreased cognition, decreased coordination, difficulty walking, decreased ROM, decreased strength, increased muscle spasms, impaired flexibility, postural dysfunction, and pain.   ACTIVITY LIMITATIONS carrying, lifting, bending, standing, squatting, stairs, reach over head, and locomotion level  PARTICIPATION LIMITATIONS: cleaning, laundry, driving, shopping,  and community activity  PERSONAL FACTORS Age and 1 comorbidity: multiple meningiomas  are also affecting patient's functional outcome.   REHAB POTENTIAL: Good  CLINICAL DECISION MAKING: Evolving/moderate complexity  EVALUATION COMPLEXITY: Moderate   GOALS: Goals reviewed with patient? Yes  SHORT TERM GOALS: Target date: 06/08/2022  I with initial HEP Baseline: Goal status: ongoing  LONG TERM GOALS: Target date: 07/20/2022  I with final HEP Baseline:  Goal status: INITIAL  2.  Patient will achieve 120 degrees of active shoulder elevation with pain < 3/10 Baseline: 103, pain 4/10  Goal status: ongoing  3.  Complete 5 x STS in <12 sec to demonstrate improved LE strength Baseline: 27.07s Goal status: ongoing  4.  Patient will complete TUG in < 12 sec with or without AD Baseline: 21.73s Goal status: ongoing  5.  Patient will score at least 18/24 on FGA (Cannot attempt heel/toe gait) Baseline: 13/24 w/RW  Goal status: INITIAL  6.  Patient will walk at least 600' with LRAD, MI on level an unlevel surfaces, back pain < 3/10 Baseline: 80', unsteady, RW Goal status: INITIAL   PLAN: PT FREQUENCY: 2x/week  PT DURATION: 10 weeks  PLANNED INTERVENTIONS: Therapeutic exercises, Therapeutic activity, Neuromuscular re-education, Balance training, Gait training, Patient/Family education, Self Care, Joint mobilization, Stair training, Dry Needling, Electrical stimulation, Cryotherapy, Moist heat, Ultrasound, Ionotophoresis '4mg'$ /ml Dexamethasone, and Manual therapy.  PLAN FOR NEXT SESSION: Re-assess/update HEP for shoulders, neck, balance   Ethel Rana DPT 06/06/22 11:46 AM  06/06/2022, 11:46 AM   06/06/2022, 11:46 AM

## 2022-06-08 ENCOUNTER — Ambulatory Visit: Payer: Medicare Other | Admitting: Physical Therapy

## 2022-06-08 ENCOUNTER — Encounter: Payer: Self-pay | Admitting: Physical Therapy

## 2022-06-08 DIAGNOSIS — M62830 Muscle spasm of back: Secondary | ICD-10-CM | POA: Diagnosis not present

## 2022-06-08 DIAGNOSIS — M75101 Unspecified rotator cuff tear or rupture of right shoulder, not specified as traumatic: Secondary | ICD-10-CM | POA: Diagnosis not present

## 2022-06-08 DIAGNOSIS — M6281 Muscle weakness (generalized): Secondary | ICD-10-CM

## 2022-06-08 DIAGNOSIS — R531 Weakness: Secondary | ICD-10-CM

## 2022-06-08 DIAGNOSIS — R278 Other lack of coordination: Secondary | ICD-10-CM

## 2022-06-08 DIAGNOSIS — R2681 Unsteadiness on feet: Secondary | ICD-10-CM

## 2022-06-08 DIAGNOSIS — D329 Benign neoplasm of meninges, unspecified: Secondary | ICD-10-CM

## 2022-06-08 NOTE — Therapy (Addendum)
OUTPATIENT PHYSICAL THERAPY THORACOLUMBAR TREATMENT  PHYSICAL THERAPY DISCHARGE SUMMARY  Visits from Start of Care: 9  Current functional level related to goals / functional outcomes: Goals partially met   Remaining deficits: Continues to report dizziness and occular changes which impede balance.   Education / Equipment: HEP   Patient agrees to discharge. Patient goals were partially met. Patient is being discharged due to maximized rehab potential.    Patient Name: Timothy Moran MRN: [id number] DOB:[birth year], 76 y.o., male Today's Date: 06/08/2022   PT End of Session - 06/08/22 1100     Visit Number 8    Date for PT Re-Evaluation 07/20/22    PT Start Time 1059    PT Stop Time 1130    PT Time Calculation (min) 31 min    Activity Tolerance Patient tolerated treatment well    Behavior During Therapy [facility] for tasks assessed/performed                    Past Medical History:  Diagnosis Date   Acute blood loss anemia 10/05/2020   Acute respiratory failure ([place]), hypothermia therapy, vent - extubated 10/06/20    AKI (acute kidney injury) ([place]) 10/05/2020   Anoxic brain injury ([place]) 10/26/2020   Arrhythmia    Ascending aortic aneurysm ([place]) 01/29/2018   a. 2019 4.3 cm by echo; b. 02/2021 Echo: Ao root 50mm.   Benign brain tumor ([place])    Benign neoplasm of brain ([place]) 12/19/2017   Cardiac arrest with ventricular fibrillation ([place])    Chest pain 12/04/2017   Chronic kidney disease, stage III (moderate) ([place]) 12/19/2017   CKD (chronic kidney disease), stage III - IV (HCC)    Coronary Artery Disease    a. 10/3760 Inf STEMI complicated by cardiac arrest/PCI: LAD 50p/m, LCX 100d (2.5 x 30 Resolute Onyx DES); b. 04/2021 PCI: 04/2021 LM nl, LAD 50p/19m (2.5x30 Onyx Frontier DES), LCX 25p, 84m (RFR 0.98), 40d ISR (RFR 0.98), OM2 25, RCA mild diff dzs.   Diabetes mellitus, type 2 ([place]) 10/06/2020   10/06/20 A1C 7%   Dizziness 10/11/2019   Encounter for imaging study to  confirm orogastric (OG) tube placement    Hematuria 10/26/2020   HFrEF (heart failure with reduced ejection fraction) ([place])    a. 11/2020 Echo: EF 25-30%; b. 02/2021 Echo: EF 25-30%, glob HK. Mild LVH. Nl RV fxn. Triv AI. Ao root 83mm.   History of pulmonary embolus (PE)    HLD (hyperlipidemia) 10/26/2020   Hypertension    Iron deficiency anemia rec'd IV iron 10/26/2020   Ischemic cardiomyopathy    a. 11/2020 Echo: EF 25-30%; b. 02/2021 Echo: EF 25-30%, glob HK.   Left bundle branch block 12/19/2017   Meningioma ([place]) 08/14/2020   Nonsustained ventricular tachycardia ([place]) 10/11/2019   Pain of left hip joint 05/29/2020   Personal history of pulmonary embolism 12/19/2017   Pneumonia of both lungs due to infectious organism    Pulmonary hypertension due to thromboembolism ([place]) 03/09/2018   See echo 12/27/17 with nl PAS vs CTa chest 02/23/18    S/P angioplasty with stent 10/04/20 DES to LCX  10/26/2020   Sinus bradycardia 12/22/2017   SOB (shortness of breath)    Solitary pulmonary nodule on lung CT 03/08/2018   CT 12/04/17 1.0 x 0.8 x 0.7 cm nodular opacity in the RUL vs not seen 03/16/10 posterior segment of the right upper lobe. Marland KitchenSpirometry 03/08/2018    FEV1 3.86 (108%)  Ratio 96 s prior rx  - PET  03/13/18   Low grade c/w adenoca > rec T surgery eval     STEMI (ST elevation myocardial infarction) ([place]) 10/04/2020   Urinary retention 10/26/2020   Vestibular schwannoma ([place]) 08/14/2020   Past Surgical History:  Procedure Laterality Date   APPENDECTOMY     BIV ICD INSERTION CRT-D N/A 11/03/2021   Procedure: BIV ICD INSERTION CRT-D;  Surgeon: Constance Haw, MD;  Location: [place] [facility];  Service: Cardiovascular;  Laterality: N/A;   CARDIAC CATHETERIZATION     CORONARY ANGIOPLASTY WITH STENT PLACEMENT Left 04/12/2021   LAD stent placement   CORONARY STENT INTERVENTION N/A 04/12/2021   Procedure: CORONARY STENT INTERVENTION;  Surgeon: Nelva Bush, MD;  Location: [place] [facility];   Service: Cardiovascular;  Laterality: N/A;   CORONARY/GRAFT ACUTE MI REVASCULARIZATION N/A 10/04/2020   Procedure: Coronary/Graft Acute MI Revascularization;  Surgeon: Nelva Bush, MD;  Location: [place] [facility];  Service: Cardiovascular;  Laterality: N/A;   HERNIA REPAIR     INTRAVASCULAR PRESSURE WIRE/FFR STUDY N/A 04/12/2021   Procedure: INTRAVASCULAR PRESSURE WIRE/FFR STUDY;  Surgeon: Nelva Bush, MD;  Location: [place] [facility];  Service: Cardiovascular;  Laterality: N/A;   INTRAVASCULAR ULTRASOUND/IVUS N/A 04/12/2021   Procedure: Intravascular Ultrasound/IVUS;  Surgeon: Nelva Bush, MD;  Location: [place] [facility];  Service: Cardiovascular;  Laterality: N/A;   LEFT HEART CATH AND CORONARY ANGIOGRAPHY N/A 10/04/2020   Procedure: LEFT HEART CATH AND CORONARY ANGIOGRAPHY;  Surgeon: Nelva Bush, MD;  Location: [place] [facility];  Service: Cardiovascular;  Laterality: N/A;   RIGHT/LEFT HEART CATH AND CORONARY ANGIOGRAPHY N/A 04/12/2021   Procedure: RIGHT/LEFT HEART CATH AND CORONARY ANGIOGRAPHY;  Surgeon: Nelva Bush, MD;  Location: [place] [facility];  Service: Cardiovascular;  Laterality: N/A;   Patient Active Problem List   Diagnosis Date Noted   Anemia    Leukocytosis    Right arm weakness 01/25/2022   Right sided weakness    Fall at home 01/22/2022   Status post biventricular pacemaker - MRI compatible PPM and leads 01/22/2022   Chronic indwelling Foley catheter 01/22/2022   CKD (chronic kidney disease), stage III - IV ([place]) 06/28/2021   Chronic HFrEF (heart failure with reduced ejection fraction) ([place]) 03/31/2021   Arrhythmia    DM (diabetes mellitus), type 2 ([place]) new 10/26/2020   Urinary retention 10/26/2020   Hyperlipidemia LDL goal <70 10/26/2020   Coronary artery disease of native artery of native heart with stable angina pectoris ([place]) 10/26/2020   S/P angioplasty with stent 10/04/20 DES to LCX  10/26/2020   Ischemic cardiomyopathy 10/26/2020    Diabetes mellitus, type 2 ([place]) 10/06/2020   DOE (dyspnea on exertion)    Meningioma ([place]) 08/14/2020   Vestibular schwannoma ([place]) 08/14/2020   Cardiac arrest with ventricular fibrillation (HCC)    Pain of left hip joint 05/29/2020   Benign brain tumor ([place])    Chronic kidney disease    History of pulmonary embolus (PE)    Hypertension    Nonsustained ventricular tachycardia ([place]) 10/11/2019   Dizziness 10/11/2019   Abnormal echocardiogram 06/07/2019   Pulmonary hypertension due to thromboembolism ([place]) 03/09/2018   Solitary pulmonary nodule on lung CT 03/08/2018   Ascending aortic aneurysm (HCC) 4.2 cm based on CT from December 2020 01/29/2018   Sinus bradycardia 12/22/2017   Left bundle branch block 12/19/2017   Personal history of pulmonary embolism - no longer on anticoagulation due to pt refusal to take it anymore. 12/19/2017   Chronic kidney disease, stage  III (moderate) ([place]) 12/19/2017   Benign neoplasm of brain ([place]) 12/19/2017    PCP: Cari Caraway, MD   REFERRING PROVIDER: Cari Caraway, MD   REFERRING DIAG: Diagnosis [phone] (ICD-10-CM) - Muscle spasm of back M75.101 (ICD-10-CM) - Unspecified rotator cuff tear or rupture of right shoulder, not specified as traumatic   Rationale for Evaluation and Treatment Rehabilitation  THERAPY DIAG:  Unsteadiness on feet  Right sided weakness  Other lack of coordination  Muscle weakness (generalized)  Meningioma ([place])  ONSET DATE: 05/10/2022   SUBJECTIVE:                                                                                                                                                                                           SUBJECTIVE STATEMENT: Patient reports no new falls. HEP is challenging him.   PERTINENT HISTORY:  Brief HPI:   Decker Cogdell is a 76 y.o. male who fell backward striking his head and right neck shoulder area on 01/21/2022. He complained of right hemiparesis. CT head revealed  left parafalcine meningioma.Started on Decadron with improvement in symptoms   PAIN:  Are you having pain? Yes: NPRS scale: 4/10 Pain location: B low back, no radiating Pain description: sharp, grabbing Aggravating factors: movement Relieving factors: sitting still,    PRECAUTIONS: None  WEIGHT BEARING RESTRICTIONS No  FALLS:  Has patient fallen in last 6 months? Yes. Number of falls 2  LIVING ENVIRONMENT: Lives with: lives with their spouse Lives in: House/apartment Stairs: Yes: External: 7 steps; bilateral but cannot reach both Has following equipment at home: Gilford Rile - 2 wheeled  OCCUPATION: Retired  PLOF: Independent  PATIENT GOALS Feel normal with standing and functioning.   OBJECTIVE:   DIAGNOSTIC FINDINGS:  CT Lumbar- 3. Moderate degenerative spinal stenosis at L2-3. CT CERVICAL IMPRESSION: 1. No evidence of intracranial injury. 2. Negative for cervical spine fracture but there is prevertebral edema at C4-5 suggesting soft tissue injury. 3. Meningiomas with left cerebral of edema, reference brain MRI 01/22/2022.  PATIENT SURVEYS:  FOTO 63.1  SCREENING FOR RED FLAGS: Bowel or bladder incontinence: No Spinal tumors: No Cauda equina syndrome: No Compression fracture: No Abdominal aneurysm: Yes: Abdominal aortic aneurysm, stable  COGNITION:  Overall cognitive status: Within functional limits for tasks assessed and Patient appeared slightly more confused about the details of his injury at this time compared to his last visit.      SENSATION: WFL Patient reports tingling in B hands, all over.  MUSCLE LENGTH: Hamstrings: approximately 70 degrees B.  POSTURE: rounded shoulders and forward head  PALPATION: No TTP in R shoulder or up traps. B up traps tight,  low back TTP R and L proximal to Illiac crest  LUMBAR ROM:  AROM limited in all directions, but he lost his balance frequently, so accuracy of testing limited.   LOWER EXTREMITY ROM:       General mild stiffness throughout BLE with PROM.  LOWER EXTREMITY MMT:  grossly 4+/5   LUMBAR SPECIAL TESTS:  Slump test: Positive R  Shoulder Special Tests empty can test (+) R, Hawkins-Kennedy test (+) R. Previous MRI reveals full tear of supraspinatus and partial tear of infraspinatus.  FUNCTIONAL TESTS:  5 times sit to stand: 27.07 Timed up and go (TUG): TBD Functional gait assessment: TBD  GAIT: Distance walked: 80 Assistive device utilized: Walker - 2 wheeled Level of assistance: SBA Comments: Patient was unsteady with turns, relied on UE support for balance.    TODAY 06/08/22 Recumbent Bike L 3.5 x 6 minutes. Re-assessed functional status for D/C. See Goals.  06/06/22 NuStep L5 x 6 minutes Updated HEP to address additional balance training Side to side quick steps x 30 sec Heel raises x 20, both at counter  Wall bumps with feet moving progressively further from the wall Step ups onto Airex pad, required UE support due to instability Ambulated while holding 4# weight in BUE, possibly slightly less instability noted during turns.   06/02/22 NuStep L5 x 6 minutes legs only Occular screen-  Spontaneous nystagmus (-) Visual fields WNL Smooth pursuit WNL Saccades horiz and vert- WNL Vestibular assessment VOR mildly positive- assessment was modified due to pain In his neck with head turning. Stands 3 x 30 sec- mildly unsteady, but no LOB, eyes closed, very unstable, LOB < 10 sec Stands on Airex-1 x 15 sec, 1 x 30 sec, unsteady. Eyes closed deferred. Patient education provided  9/18 Orthostatic re-testing: - supine 113/64 HR 56 - sitting 109/79  HR 70 -standing 108/69 HR 58 -standing x3 minutes 110/76 HR 43  Manual check of HR 48PM, regularly irregular   Balance training:  -  tandem stance in // bars 3x30 seconds B ModA  - narrow BOS 3x30 seconds MinA for balance   HR 56 BPM at EOS, minimal dizziness   05/25/22 Recumbent bike L5 x 6 minutes. Supine  strength with Physioball, bridging, bridge with hip IR x 10 reps each, roll ball side to side while in bridge x 5 reps. Supine clamshells against Green Tband resistance. Standing B side to side step x 10 with Green Tband resistance around ankles. Wall stars, 3 points x 5 with Yellow Tband resistance, B PROM for R shoulder flex, ext, ER, Abd Wall slides into shouldre flexion x 10 reps  05/23/22 NuStep L5 x 6 minutes Orthostatic BP sitting 121/73, 56 BPM, Immediate standing 92/50, 54 BPM, symptomatic. Symptoms did not resolve, but BP monitor malfunctioned at 3 minutes. Standing hip flexion and heel raises with minimal UE support on RW 10 each Standing side to side step x 10, then forward lunges alternating each leg x 5  Ambulated 1 x 120' to car with CGA using gait belt, mild unsteadiness noted, especially upon first rising.  5xSTS TUG FGA  LE strength   Nustep L3 x99mins UE and LE  Rows and ext red TB 2x10     PATIENT EDUCATION:  Education details: 06/02/22 Vestibular-assessment points to some central vestibular impairment which is most likely associated with his meningioma. Person educated: Patient and Spouse Education method: Explanation Education comprehension: verbalized understanding   HOME EXERCISE PROGRAM: [place]  ASSESSMENT:  CLINICAL IMPRESSION: Patient's functional status  re-assessed for D/C, with good improvement noted, back to his original baseline. He did not meet all goals due to continued dizziness and double vision, but is much more steady , with decreased fall risk. He has an appointment for treatment for his Meningiomas coming up, which may help the dizziness and vision issues.   OBJECTIVE IMPAIRMENTS Abnormal gait, decreased activity tolerance, decreased balance, decreased cognition, decreased coordination, difficulty walking, decreased ROM, decreased strength, increased muscle spasms, impaired flexibility, postural dysfunction, and pain.   ACTIVITY  LIMITATIONS carrying, lifting, bending, standing, squatting, stairs, reach over head, and locomotion level  PARTICIPATION LIMITATIONS: cleaning, laundry, driving, shopping, and community activity  PERSONAL FACTORS Age and 1 comorbidity: multiple meningiomas  are also affecting patient's functional outcome.   REHAB POTENTIAL: Good  CLINICAL DECISION MAKING: Evolving/moderate complexity  EVALUATION COMPLEXITY: Moderate   GOALS: Goals reviewed with patient? Yes  SHORT TERM GOALS: Target date: 06/08/2022  I with initial HEP Baseline: Goal status: met  LONG TERM GOALS: Target date: 07/20/2022  I with final HEP Baseline:  Goal status: met  2.  Patient will achieve 120 degrees of active shoulder elevation with pain < 3/10 Baseline: 103, pain 4/10, 9/27- 117, pain 2/10 Goal status: not met  3.  Complete 5 x STS in <12 sec to demonstrate improved LE strength Baseline: 27.07s, 9/27-15 sec Goal status: not met  4.  Patient will complete TUG in < 12 sec with or without AD Baseline: 21.73s, 11.38 Goal status: met  5.  Patient will score at least 18/24 on FGA (Cannot attempt heel/toe gait) Baseline: 13/24 w/RW , 9/27-18/24 Goal status: met  6.  Patient will walk at least 600' with LRAD, MI on level an unlevel surfaces, back pain < 3/10 Baseline: 80', unsteady, RW, 9/27-patient reports walking in his neighborhood, approximates at least 600'. Goal status: met   PLAN: PT FREQUENCY: 2x/week  PT DURATION: 10 weeks  PLANNED INTERVENTIONS: Therapeutic exercises, Therapeutic activity, Neuromuscular re-education, Balance training, Gait training, Patient/Family education, Self Care, Joint mobilization, Stair training, Dry Needling, Electrical stimulation, Cryotherapy, Moist heat, Ultrasound, Ionotophoresis 4mg /ml Dexamethasone, and Manual therapy.  PLAN FOR NEXT SESSION: Re-assess/update HEP for shoulders, neck, balance   Ethel Rana DPT 06/08/22 11:32 AM  06/08/2022, 11:32  AM   06/08/2022, 11:32 AM

## 2022-06-08 NOTE — Therapy (Signed)
[place]. [place], [place], [address] Phone: [phone]   Fax:  [phone]  Patient Details  Name: Timothy Moran MRN: [id number] Date of Birth: August 07, 1946 Referring Provider:  Cari Caraway, MD  Encounter Date: 06/08/2022   Marcelina Morel, DPT 06/08/2022, 2:45 PM  [address] [place], [place], [address] Phone: [phone]   Fax:  [phone]

## 2022-06-13 ENCOUNTER — Ambulatory Visit: Payer: Medicare Other | Attending: Cardiology | Admitting: Cardiology

## 2022-06-13 ENCOUNTER — Encounter: Payer: Self-pay | Admitting: Cardiology

## 2022-06-13 ENCOUNTER — Ambulatory Visit: Payer: Medicare Other | Admitting: Physical Therapy

## 2022-06-13 VITALS — BP 132/70 | HR 68 | Ht 73.0 in | Wt 221.1 lb

## 2022-06-13 DIAGNOSIS — I5022 Chronic systolic (congestive) heart failure: Secondary | ICD-10-CM | POA: Insufficient documentation

## 2022-06-13 DIAGNOSIS — I255 Ischemic cardiomyopathy: Secondary | ICD-10-CM | POA: Insufficient documentation

## 2022-06-13 DIAGNOSIS — I493 Ventricular premature depolarization: Secondary | ICD-10-CM | POA: Diagnosis not present

## 2022-06-13 DIAGNOSIS — I447 Left bundle-branch block, unspecified: Secondary | ICD-10-CM | POA: Insufficient documentation

## 2022-06-13 LAB — CUP PACEART INCLINIC DEVICE CHECK
Battery Remaining Longevity: 111 mo
Battery Voltage: 3.02 V
Brady Statistic AP VP Percent: 15.17 %
Brady Statistic AP VS Percent: 0.21 %
Brady Statistic AS VP Percent: 81.59 %
Brady Statistic AS VS Percent: 3.03 %
Brady Statistic RA Percent Paced: 14.43 %
Brady Statistic RV Percent Paced: 42.55 %
Date Time Interrogation Session: 20231002154359
HighPow Impedance: 57 Ohm
Implantable Lead Implant Date: 20230222
Implantable Lead Implant Date: 20230222
Implantable Lead Implant Date: 20230222
Implantable Lead Location: 753858
Implantable Lead Location: 753859
Implantable Lead Location: 753860
Implantable Lead Model: 4798
Implantable Lead Model: 5076
Implantable Pulse Generator Implant Date: 20230222
Lead Channel Impedance Value: 194.634
Lead Channel Impedance Value: 203.256
Lead Channel Impedance Value: 207.273
Lead Channel Impedance Value: 212.8 Ohm
Lead Channel Impedance Value: 223.149
Lead Channel Impedance Value: 380 Ohm
Lead Channel Impedance Value: 380 Ohm
Lead Channel Impedance Value: 399 Ohm
Lead Channel Impedance Value: 437 Ohm
Lead Channel Impedance Value: 456 Ohm
Lead Channel Impedance Value: 532 Ohm
Lead Channel Impedance Value: 589 Ohm
Lead Channel Impedance Value: 627 Ohm
Lead Channel Impedance Value: 665 Ohm
Lead Channel Impedance Value: 722 Ohm
Lead Channel Impedance Value: 722 Ohm
Lead Channel Impedance Value: 760 Ohm
Lead Channel Impedance Value: 817 Ohm
Lead Channel Pacing Threshold Amplitude: 0.5 V
Lead Channel Pacing Threshold Amplitude: 0.625 V
Lead Channel Pacing Threshold Amplitude: 1 V
Lead Channel Pacing Threshold Pulse Width: 0.4 ms
Lead Channel Pacing Threshold Pulse Width: 0.4 ms
Lead Channel Pacing Threshold Pulse Width: 0.4 ms
Lead Channel Sensing Intrinsic Amplitude: 1 mV
Lead Channel Sensing Intrinsic Amplitude: 1.25 mV
Lead Channel Sensing Intrinsic Amplitude: 14.75 mV
Lead Channel Sensing Intrinsic Amplitude: 17 mV
Lead Channel Setting Pacing Amplitude: 1.5 V
Lead Channel Setting Pacing Amplitude: 1.5 V
Lead Channel Setting Pacing Amplitude: 2 V
Lead Channel Setting Pacing Pulse Width: 0.4 ms
Lead Channel Setting Pacing Pulse Width: 0.4 ms
Lead Channel Setting Sensing Sensitivity: 0.3 mV

## 2022-06-13 MED ORDER — MEXILETINE HCL 250 MG PO CAPS: 250.0000 mg | ORAL_CAPSULE | Freq: Three times a day (TID) | ORAL | 3 refills | Status: DC

## 2022-06-13 NOTE — Patient Instructions (Signed)
Medication Instructions:  Your physician has recommended you make the following change in your medication:  START Mexiletine 250 mg twice daily  *If you need a refill on your cardiac medications before your next appointment, please call your pharmacy*   Lab Work: None ordered   Testing/Procedures: None ordered   Follow-Up: At [facility], you and your health needs are our priority.  As part of our continuing mission to provide you with exceptional heart care, we have created designated Provider Care Teams.  These Care Teams include your primary Cardiologist (physician) and Advanced Practice Providers (APPs -  Physician Assistants and Nurse Practitioners) who all work together to provide you with the care you need, when you need it.  Your next appointment:   3 month(s)  The format for your next appointment:   In Person  Provider:   Will Camnitz, MD    Thank you for choosing [facility]!!   Marnesha Gagen, RN [phone]  Other Instructions   Mexiletine Capsules What is this medication? MEXILETINE (mex IL e teen) treats a fast or irregular heartbeat (arrhythmia). It works by slowing down overactive electric signals in the heart, which stabilizes your heart rhythm. It belongs to a group of medications called antiarrhythmics. This medicine may be used for other purposes; ask your health care provider or pharmacist if you have questions. COMMON BRAND NAME(S): Mexitil What should I tell my care team before I take this medication? They need to know if you have any of these conditions: Liver disease Other heart problems Previous heart attack An unusual or allergic reaction to mexiletine, other medications, foods, dyes, or preservatives Pregnant or trying to get pregnant Breast-feeding How should I use this medication? Take this medication by mouth with a glass of water. Follow the directions on the prescription label. It is recommended that you take this medication  with food or an antacid. Take your doses at regular intervals. Do not take your medication more often than directed. Do not stop taking except on the advice of your care team. Talk to your care team about the use of this medication in children. Special care may be needed. Overdosage: If you think you have taken too much of this medicine contact a poison control center or emergency room at once. NOTE: This medicine is only for you. Do not share this medicine with others. What if I miss a dose? If you miss a dose, take it as soon as you can. If it is almost time for your next dose, take only that dose. Do not take double or extra doses. What may interact with this medication? Do not take this medication with any of the following: Dofetilide This medication may also interact with the following: Caffeine Cimetidine Medications for depression, anxiety, or psychotic disturbances Medications to control heart rhythm Phenobarbital Phenytoin Rifampin Theophylline This list may not describe all possible interactions. Give your health care provider a list of all the medicines, herbs, non-prescription drugs, or dietary supplements you use. Also tell them if you smoke, drink alcohol, or use illegal drugs. Some items may interact with your medicine. What should I watch for while using this medication? Your condition will be monitored closely when you first begin therapy. Often, this medication is first started in a hospital or other monitored health care setting. Once you are on maintenance therapy, visit your care team for regular checks on your progress. Because your condition and use of this medication carry some risk, it is a good idea to   carry an identification card, necklace or bracelet with details of your condition, medications, and care team. You may get drowsy or dizzy. Do not drive, use machinery, or do anything that needs mental alertness until you know how this medication affects you. Do not stand  or sit up quickly, especially if you are an older patient. This reduces the risk of dizzy or fainting spells. Alcohol can make you more dizzy, increase flushing and rapid heartbeats. Avoid alcoholic drinks. This medication may cause serious skin reactions. They can happen weeks to months after starting the medication. Contact your care team right away if you notice fevers or flu-like symptoms with a rash. The rash may be red or purple and then turn into blisters or peeling of the skin. Or, you might notice a red rash with swelling of the face, lips or lymph nodes in your neck or under your arms. What side effects may I notice from receiving this medication? Side effects that you should report to your care team as soon as possible: Allergic reactions--skin rash, itching, hives, swelling of the face, lips, tongue, or throat Heart rhythm changes--fast or irregular heartbeat, dizziness, feeling faint or lightheaded, chest pain, trouble breathing Infection--fever, chills, cough, or sore throat Liver injury--right upper belly pain, loss of appetite, nausea, light-colored stool, dark yellow or brown urine, yellowing skin or eyes, unusual weakness or fatigue Rash, fever, and swollen lymph nodes Seizures Unusual bruising or bleeding Side effects that usually do not require medical attention (report to your care team if they continue or are bothersome): Anxiety, nervousness Blurry vision Dizziness Headache Heartburn Nausea Tremors or shaking Vomiting This list may not describe all possible side effects. Call your doctor for medical advice about side effects. You may report side effects to FDA at 1-800-FDA-1088. Where should I keep my medication? Keep out of reach of children. Store at room temperature between 15 and 30 degrees C (59 and 86 degrees F). Throw away any unused medication after the expiration date. NOTE: This sheet is a summary. It may not cover all possible information. If you have  questions about this medicine, talk to your doctor, pharmacist, or health care provider.  2023 Elsevier/Gold Standard (2021-03-10 00:00:00)     Important Information About Sugar           

## 2022-06-13 NOTE — Progress Notes (Signed)
Electrophysiology Office Note   Date:  06/13/2022   ID:  Timothy Moran, DOB August 06, 1946, MRN [id number]  PCP:  Timothy Caraway, MD  Cardiologist:  Agustin Cree Primary Electrophysiologist:  Timothy Kloeppel Meredith Leeds, MD    Chief Complaint: CHF   History of Present Illness: Timothy Moran is a 76 y.o. male who is being seen today for the evaluation of CHF at the request of Timothy Caraway, MD. Presenting today for electrophysiology evaluation.  He has a history significant for ascending aortic aneurysm, stage III CKD, coronary artery disease, type 2 diabetes, chronic systolic heart failure due to ischemic cardiomyopathy, PE, hyperlipidemia, hypertension.  January 2022 he was shoveling snow and had chest pain and collapsed.  He was in ventricular fibrillation was diagnosed with acute STEMI and status post circumflex stent.  He wore a LifeVest but took it off.  He presented to the Cath Lab August 2022 and had stenting of an 80% LAD lesion.  He is now status post Medtronic CRT-D implanted 11/03/2021.  He continues to feel mildly fatigued.  Review of device interrogation shows low biventricular pacing percentage, and an elevated PVC percentage.  Today, denies symptoms of palpitations, chest pain, shortness of breath, orthopnea, PND, lower extremity edema, claudication, dizziness, presyncope, syncope, bleeding, or neurologic sequela. The patient is tolerating medications without difficulties.     Past Medical History:  Diagnosis Date   Acute blood loss anemia 10/05/2020   Acute respiratory failure ([place]), hypothermia therapy, vent - extubated 10/06/20    AKI (acute kidney injury) ([place]) 10/05/2020   Anoxic brain injury (Timothy Moran) 10/26/2020   Arrhythmia    Ascending aortic aneurysm (Timothy Moran) 01/29/2018   a. 2019 4.3 cm by echo; b. 02/2021 Echo: Ao root 56m.   Benign brain tumor (Timothy Moran    Benign neoplasm of brain (Timothy Moran 12/19/2017   Cardiac arrest with ventricular fibrillation (Timothy Moran    Chest pain  12/04/2017   Chronic kidney disease, stage III (moderate) (Timothy Moran 12/19/2017   CKD (chronic kidney disease), stage III - IV (Timothy Moran)    Coronary Artery Disease    a. 10/8657Inf STEMI complicated by cardiac arrest/PCI: LAD 50p/m, LCX 100d (2.5 x 30 Resolute Onyx DES); b. 04/2021 PCI: 04/2021 LM nl, LAD 50p/877m2.5x30 Onyx Frontier DES), LCX 25p, 5041mFR 0.98), 40d ISR (RFR 0.98), OM2 25, RCA mild diff dzs.   Diabetes mellitus, type 2 (Timothy Moran   10/06/20 A1C 7%   Dizziness 10/11/2019   Encounter for imaging study to confirm orogastric (OG) tube placement    Hematuria 10/26/2020   HFrEF (heart failure with reduced ejection fraction) (Timothy Moran  a. 11/2020 Echo: EF 25-30%; b. 02/2021 Echo: EF 25-30%, glob HK. Mild LVH. Nl RV fxn. Triv AI. Ao root 28m72m History of pulmonary embolus (PE)    HLD (hyperlipidemia) 10/26/2020   Hypertension    Iron deficiency anemia rec'd IV iron 10/26/2020   Ischemic cardiomyopathy    a. 11/2020 Echo: EF 25-30%; b. 02/2021 Echo: EF 25-30%, glob HK.   Left bundle branch block 12/19/2017   Meningioma (Timothy Moran)Timothy Moran/11/2019   Nonsustained ventricular tachycardia (Timothy Moran)Timothy Moran/29/2021   Pain of left hip joint 05/29/2020   Personal history of pulmonary embolism 12/19/2017   Pneumonia of both lungs due to infectious organism    Pulmonary hypertension due to thromboembolism (Timothy Moran)Timothy Moran/28/2019   See echo 12/27/17 with nl PAS vs CTa chest 02/23/18    S/P angioplasty with stent 10/04/20 DES to LCX  10/26/2020   Sinus bradycardia 12/22/2017  SOB (shortness of breath)    Solitary pulmonary nodule on lung CT 03/08/2018   CT 12/04/17 1.0 x 0.8 x 0.7 cm nodular opacity in the RUL vs not seen 03/16/10 posterior segment of the right upper lobe. Marland KitchenSpirometry 03/08/2018    FEV1 3.86 (108%)  Ratio 96 s prior rx  - PET  03/13/18   Low grade c/w adenoca > rec T surgery eval     STEMI (ST elevation myocardial infarction) ([place]) 10/04/2020   Urinary retention 10/26/2020   Vestibular schwannoma (Timothy Moran)  08/14/2020   Past Surgical History:  Procedure Laterality Date   APPENDECTOMY     BIV ICD INSERTION CRT-D N/A 11/03/2021   Procedure: BIV ICD INSERTION CRT-D;  Surgeon: Constance Haw, MD;  Location: [facility];  Service: Cardiovascular;  Laterality: N/A;   CARDIAC CATHETERIZATION     CORONARY ANGIOPLASTY WITH STENT PLACEMENT Left 04/12/2021   LAD stent placement   CORONARY STENT INTERVENTION N/A 04/12/2021   Procedure: CORONARY STENT INTERVENTION;  Surgeon: Nelva Bush, MD;  Location: [facility];  Service: Cardiovascular;  Laterality: N/A;   CORONARY/GRAFT ACUTE MI REVASCULARIZATION N/A 10/04/2020   Procedure: Coronary/Graft Acute MI Revascularization;  Surgeon: Nelva Bush, MD;  Location: [facility];  Service: Cardiovascular;  Laterality: N/A;   HERNIA REPAIR     INTRAVASCULAR PRESSURE WIRE/FFR STUDY N/A 04/12/2021   Procedure: INTRAVASCULAR PRESSURE WIRE/FFR STUDY;  Surgeon: Nelva Bush, MD;  Location: [facility];  Service: Cardiovascular;  Laterality: N/A;   INTRAVASCULAR ULTRASOUND/IVUS N/A 04/12/2021   Procedure: Intravascular Ultrasound/IVUS;  Surgeon: Nelva Bush, MD;  Location: [facility];  Service: Cardiovascular;  Laterality: N/A;   LEFT HEART CATH AND CORONARY ANGIOGRAPHY N/A 10/04/2020   Procedure: LEFT HEART CATH AND CORONARY ANGIOGRAPHY;  Surgeon: Nelva Bush, MD;  Location: [facility];  Service: Cardiovascular;  Laterality: N/A;   RIGHT/LEFT HEART CATH AND CORONARY ANGIOGRAPHY N/A 04/12/2021   Procedure: RIGHT/LEFT HEART CATH AND CORONARY ANGIOGRAPHY;  Surgeon: Nelva Bush, MD;  Location: [facility];  Service: Cardiovascular;  Laterality: N/A;     Current Outpatient Medications  Medication Sig Dispense Refill   acetaminophen (TYLENOL) 325 MG tablet Take 1-2 tablets (325-650 mg total) by mouth every 4 (four) hours as needed for mild pain.     aspirin EC 81 MG tablet Take 81 mg by mouth every  other day. Swallow whole.     atorvastatin (LIPITOR) 80 MG tablet Take 1 tablet (80 mg total) by mouth daily. 90 tablet 3   B Complex-C (SUPER B COMPLEX PO) Take 1 tablet by mouth daily. Unknown strength     diclofenac Sodium (VOLTAREN) 1 % GEL Apply 4 g topically 4 (four) times daily. (Patient taking differently: Apply 4 g topically 4 (four) times daily. Using as needed) 100 g 0   fluticasone (FLONASE) 50 MCG/ACT nasal spray Place 1-2 sprays into both nostrils at bedtime.  2   hydrALAZINE (APRESOLINE) 10 MG tablet Take 1 tablet (10 mg total) by mouth 3 (three) times daily. 270 tablet 3   isosorbide dinitrate (ISORDIL) 30 MG tablet Take 1 tablet (30 mg total) by mouth 2 (two) times daily. 180 tablet 2   melatonin 5 MG TABS Take 1 tablet (5 mg total) by mouth at bedtime. 30 tablet 0   metoprolol succinate (TOPROL-XL) 50 MG 24 hr tablet Take 1 tablet (50 mg total) by mouth daily. Take with or immediately following a meal. 90 tablet 3  mexiletine (MEXITIL) 250 MG capsule Take 1 capsule (250 mg total) by mouth 3 (three) times daily. 60 capsule 3   Multiple Vitamin (MULTIVITAMIN WITH MINERALS) TABS tablet Take 1 tablet by mouth daily.     senna-docusate (SENOKOT-S) 8.6-50 MG tablet Take 1 tablet by mouth at bedtime as needed for mild constipation.     sodium bicarbonate 650 MG tablet Take 650 mg by mouth 2 (two) times daily.     No current facility-administered medications for this visit.    Allergies:   Patient has no known allergies.   Social History:  The patient  reports that he quit smoking about 53 years ago. His smoking use included cigarettes. He has a 2.50 pack-year smoking history. He has never used smokeless tobacco. He reports current alcohol use. He reports that he does not use drugs.   Family History:  The patient's family history includes Brain cancer in his sister; Diabetes in his mother; Leukemia in his brother; Melanoma in his brother.   ROS:  Please see the history of present  illness.   Otherwise, review of systems is positive for none.   All other systems are reviewed and negative.   PHYSICAL EXAM: VS:  BP 132/70   Pulse 68   Ht '6\' 1"'$  (1.854 m)   Wt 221 lb 1.3 oz (100.3 kg)   SpO2 95%   BMI 29.17 kg/m  , BMI Body mass index is 29.17 kg/m. GEN: Well nourished, well developed, in no acute distress  HEENT: normal  Neck: no JVD, carotid bruits, or masses Cardiac: RRR; no murmurs, rubs, or gallops,no edema  Respiratory:  clear to auscultation bilaterally, normal work of breathing GI: soft, nontender, nondistended, + BS MS: no deformity or atrophy  Skin: warm and dry, device site well healed Neuro:  Strength and sensation are intact Psych: euthymic mood, full affect  EKG:  EKG is not ordered today. Personal review of the ekg ordered 05/17/22 shows atrial sensed, ventricular paced, PVCs  Personal review of the device interrogation today. Results in [facility]: 01/25/2022: Magnesium 2.0 04/08/2022: NT-Pro BNP 3,220 05/01/2022: ALT 15; BUN 19; Creatinine, Ser 2.00; Hemoglobin 10.8; Platelets 278; Potassium 4.3; Sodium 136    Lipid Panel     Component Value Date/Time   CHOL 84 04/13/2021 0317   CHOL 247 (H) 06/01/2020 0858   TRIG 201 (H) 04/13/2021 0317   HDL 26 (L) 04/13/2021 0317   HDL 33 (L) 06/01/2020 0858   CHOLHDL 3.2 04/13/2021 0317   VLDL 40 04/13/2021 0317   LDLCALC 18 04/13/2021 0317   LDLCALC 151 (H) 06/01/2020 0858     Wt Readings from Last 3 Encounters:  06/13/22 221 lb 1.3 oz (100.3 kg)  05/19/22 219 lb (99.3 kg)  05/17/22 217 lb (98.4 kg)      Other studies Reviewed: Additional studies/ records that were reviewed today include: LHC 04/12/21  Review of the above records today demonstrates:  Severe single-vessel coronary artery disease with multifocal disease of the proximal and mid LAD of up to 80% that is hemodynamically significant by RFR and IVUS criteria. Moderate LCx disease with 50% de novo stenosis proximal to  stent placed in 09/2020.  There is also 40% in-stent restenosis in the LCx/OM2 stent.  These lesions are not hemodynamically significant (RFR = 0.98). Mildly elevated left heart, right heart, and pulmonary artery pressures. Normal Fick cardiac output/index. Successful IVUS- and RFR-guided PCI to 80% mid LAD stenosis using Onyx Frontier 2.5 x 30 mm  drug-eluting stent (postdilated to 3.2 mm) with 0% residual stenosis and TIMI-3 flow.   TTE 07/16/21  1. Left ventricular ejection fraction, by estimation, is 30 to 35%. The  left ventricle has moderately decreased function. The left ventricle has  no regional wall motion abnormalities. Left ventricular diastolic  parameters are indeterminate.   2. Right ventricular systolic function is normal. The right ventricular  size is normal.   3. The mitral valve is normal in structure. No evidence of mitral valve  regurgitation. No evidence of mitral stenosis.   4. The aortic valve is normal in structure. Aortic valve regurgitation is  trivial. No aortic stenosis is present.   5. Aneurysm of the ascending aorta, measuring 44 mm.   6. The inferior vena cava is normal in size with greater than 50%  respiratory variability, suggesting right atrial pressure of 3 mmHg.  ASSESSMENT AND PLAN:  1.  Chronic systolic heart failure: Due to ischemic cardiomyopathy.  Most recent echo with an ejection fraction of 30 to 35%.  Is status post Medtronic CRT-D implanted 11/03/2021.  Currently on optimal medical therapy.  Device function appropriately.  No changes at this time.  2.  Coronary artery disease: Status post circumflex and LAD stents.  No current chest pain.  3.  PVCs: Elevated burden based on percent pacing from his CRT-D.  We Louine Tenpenny plan to start mexiletine 250 mg twice daily.   Current medicines are reviewed at length with the patient today.   The patient does not have concerns regarding his medicines.  The following changes were made today: Start  mexiletine  Labs/ tests ordered today include:  Orders Placed This Encounter  Procedures   CUP [place]     Disposition:   FU with Audryana Hockenberry 3 months  Signed, Mykenzi Vanzile Meredith Leeds, MD  06/13/2022 7:39 PM     [facility] [address] [place] [address] [phone] (office) [phone] (fax)

## 2022-06-16 ENCOUNTER — Ambulatory Visit: Payer: Medicare Other | Admitting: Physical Therapy

## 2022-06-16 ENCOUNTER — Telehealth: Payer: Self-pay | Admitting: Cardiology

## 2022-06-16 NOTE — Telephone Encounter (Signed)
Pt c/o medication issue:  1. Name of Medication: mexiletine (MEXITIL) 250 MG capsule  2. How are you currently taking this medication (dosage and times per day)?  Take 1 capsule (250 mg total) by mouth 3 (three) times daily.  3. Are you having a reaction (difficulty breathing--STAT)? no  4. What is your medication issue? Patient states he took this medication for the first time yesterday, he got very dizzy, and ended up throwing up his entire meal.  He wants to know what he should do.  He only took the medication once yesterday, he has not taken it today.

## 2022-06-17 NOTE — Telephone Encounter (Signed)
Patient is calling to check on status of her call.  He would like a call back.

## 2022-06-17 NOTE — Telephone Encounter (Signed)
Patient reports dizziness, nausea, and vomiting after taking his first dose of mexiletine. He states that he had to lie down for the remainder of the afternoon. He did not monitor his HR or BP during this time. He has not taking any more mexiletine since. I advised him to continue to hold and I would let [name] know and we would call back with further recommendations.

## 2022-06-20 ENCOUNTER — Ambulatory Visit: Payer: Medicare Other

## 2022-06-20 MED ORDER — AMIODARONE HCL 200 MG PO TABS: ORAL_TABLET | ORAL | 3 refills | Status: DC

## 2022-06-20 NOTE — Telephone Encounter (Signed)
Pt calling back to f/u on call from last week in the 6th. Please advise

## 2022-06-20 NOTE — Telephone Encounter (Signed)
Pt informed [name] recommends starting Amiodarone 400 mg BID x 2 weeks, then reduce to 200 mg daily. Pt agreeable. Rx sent to requested pharmacy. Advised to call with any SE, if occur, after starting. Patient verbalized understanding and agreeable to plan.

## 2022-06-23 ENCOUNTER — Ambulatory Visit: Payer: Medicare Other

## 2022-06-30 DIAGNOSIS — D329 Benign neoplasm of meninges, unspecified: Secondary | ICD-10-CM | POA: Diagnosis not present

## 2022-07-04 ENCOUNTER — Telehealth: Payer: Self-pay | Admitting: Cardiology

## 2022-07-04 DIAGNOSIS — R42 Dizziness and giddiness: Secondary | ICD-10-CM

## 2022-07-04 NOTE — Telephone Encounter (Signed)
Attempted to call the pt but no answer will try again this afternoon.

## 2022-07-04 NOTE — Telephone Encounter (Signed)
Spoke with pt who states that he has been having dizziness and it is getting worse. BP sitting 160/88 HR 62, standing 175/81 HR 66. States he is not longer taking the hydralazine. Denies chest pain. Please advise.

## 2022-07-04 NOTE — Telephone Encounter (Signed)
Pt c/o medication issue:  1. Name of Medication: hydrALAZINE (APRESOLINE) 10 MG tablet  2. How are you currently taking this medication (dosage and times per day)? Is no longer taking as of a couple days of go  3. Are you having a reaction (difficulty breathing--STAT)? Yes  4. What is your medication issue? Patient is calling stating he began having dizziness/lightheadedness about 2 weeks ago when he moves his head or stands up. He believes this medication is the cause so he stopped it a few days ago, but the dizziness/lightheadedness is still occurring. Please advise.

## 2022-07-05 NOTE — Telephone Encounter (Signed)
Patient was calling for update. Please advise  

## 2022-07-08 NOTE — Addendum Note (Signed)
Addended by: Darrel Reach on: 07/08/2022 05:43 PM   Modules accepted: Orders

## 2022-07-08 NOTE — Telephone Encounter (Signed)
Spoke with patient, he has an appt with Dr. Baird Kay office. He is aware I will message his nurse to include EKG and report back to [place].

## 2022-07-11 ENCOUNTER — Telehealth: Payer: Self-pay | Admitting: Cardiology

## 2022-07-11 ENCOUNTER — Ambulatory Visit: Payer: Medicare Other | Attending: Cardiology | Admitting: Cardiology

## 2022-07-11 ENCOUNTER — Encounter: Payer: Self-pay | Admitting: Cardiology

## 2022-07-11 ENCOUNTER — Other Ambulatory Visit: Payer: Self-pay

## 2022-07-11 VITALS — BP 112/60 | HR 64 | Ht 73.0 in | Wt 221.6 lb

## 2022-07-11 DIAGNOSIS — I447 Left bundle-branch block, unspecified: Secondary | ICD-10-CM

## 2022-07-11 DIAGNOSIS — I5022 Chronic systolic (congestive) heart failure: Secondary | ICD-10-CM

## 2022-07-11 DIAGNOSIS — I255 Ischemic cardiomyopathy: Secondary | ICD-10-CM

## 2022-07-11 DIAGNOSIS — Z95810 Presence of automatic (implantable) cardiac defibrillator: Secondary | ICD-10-CM | POA: Diagnosis not present

## 2022-07-11 MED ORDER — AMIODARONE HCL 200 MG PO TABS: 100.0000 mg | ORAL_TABLET | Freq: Every day | ORAL | 3 refills | Status: DC

## 2022-07-11 MED ORDER — METOPROLOL SUCCINATE ER 25 MG PO TB24: 25.0000 mg | ORAL_TABLET | Freq: Every day | ORAL | 3 refills | Status: DC

## 2022-07-11 NOTE — Patient Instructions (Signed)
Medication Instructions:  Your physician has recommended you make the following change in your medication:   ** Decrease Metoprolol Succinate to '25mg'$  daily  ** Decrease Amiodarone '200mg'$  - 1/2 tablet by mouth daily ('100mg'$ )  *If you need a refill on your cardiac medications before your next appointment, please call your pharmacy*   Lab Work: None ordered.  If you have labs (blood work) drawn today and your tests are completely normal, you will receive your results only by: [place] (if you have MyChart) OR A paper copy in the mail If you have any lab test that is abnormal or we need to change your treatment, we will call you to review the results.   Testing/Procedures: None ordered.    Follow-Up: At [facility], you and your health needs are our priority.  As part of our continuing mission to provide you with exceptional heart care, we have created designated Provider Care Teams.  These Care Teams include your primary Cardiologist (physician) and Advanced Practice Providers (APPs -  Physician Assistants and Nurse Practitioners) who all work together to provide you with the care you need, when you need it.  We recommend signing up for the patient portal called "MyChart".  Sign up information is provided on this After Visit Summary.  MyChart is used to connect with patients for Virtual Visits (Telemedicine).  Patients are able to view lab/test results, encounter notes, upcoming appointments, etc.  Non-urgent messages can be sent to your provider as well.   To learn more about what you can do with MyChart, go to [email or web address].    Your next appointment:   6 weeks with Dr Curt Bears PA  Important Information About Sugar

## 2022-07-11 NOTE — Telephone Encounter (Signed)
Dr Curt Bears made aware.

## 2022-07-11 NOTE — Telephone Encounter (Signed)
Spoke with spouse about ref request for Toprol XL 50. Advised that [name] had reduced his Toprol XL to '25mg'$  daily at his appt today. No need to refill '50mg'$  .

## 2022-07-11 NOTE — Progress Notes (Signed)
Electrophysiology Office Note   Date:  07/11/2022   ID:  Timothy Moran, DOB 1946-02-12, MRN [id number]  PCP:  Cari Caraway, MD  Cardiologist:  Agustin Cree Primary Electrophysiologist:  Jalaya Sarver Meredith Leeds, MD    Chief Complaint: CHF   History of Present Illness: Timothy Moran is a 76 y.o. male who is being seen today for the evaluation of CHF at the request of Cari Caraway, MD. Presenting today for electrophysiology evaluation.  He has a history significant for ascending aortic aneurysm, CKD stage III, coronary artery disease, type 2 diabetes, chronic systolic heart failure due to ischemic cardiomyopathy, PE, hyperlipidemia, hypertension.  January 2022 he was shoveling snow and had chest pain and collapsed.  He was in ventricular fibrillation.  He was diagnosed with acute STEMI and was status post circumflex stent.  He represented to the Cath Lab August 2022 and had stenting of an 80% LAD lesion.  He is status post Medtronic CRT-D implanted 11/03/2021.  He was found to have a low biventricular pacing, thought due to PVCs.  He is now on amiodarone.  Today, denies symptoms of palpitations, chest pain, shortness of breath, orthopnea, PND, lower extremity edema, claudication, presyncope, syncope, bleeding, or neurologic sequela. The patient is tolerating medications without difficulties.  He has been having episodes of dizziness.  His dizzy episodes occur when he stands up or closes his eyes and moves his head.  He had an episode of dizziness in the exam room when he sat forward from a seated position.  He states that it feels like the room is spinning.   Past Medical History:  Diagnosis Date   Acute blood loss anemia 10/05/2020   Acute respiratory failure ([place]), hypothermia therapy, vent - extubated 10/06/20    AKI (acute kidney injury) ([place]) 10/05/2020   Anoxic brain injury ([place]) 10/26/2020   Arrhythmia    Ascending aortic aneurysm ([place]) 01/29/2018   a. 2019 4.3 cm by echo; b.  02/2021 Echo: Ao root 54m.   Benign brain tumor ([facility]    Benign neoplasm of brain ([facility] 12/19/2017   Cardiac arrest with ventricular fibrillation ([facility]    Chest pain 12/04/2017   Chronic kidney disease, stage III (moderate) ([facility] 12/19/2017   CKD (chronic kidney disease), stage III - IV (HCC)    Coronary Artery Disease    a. [id number]/6387Inf STEMI complicated by cardiac arrest/PCI: LAD 50p/m, LCX 100d (2.5 x 30 Resolute Onyx DES); b. 04/2021 PCI: 04/2021 LM nl, LAD 50p/87m2.5x30 Onyx Frontier DES), LCX 25p, 5063mFR 0.98), 40d ISR (RFR 0.98), OM2 25, RCA mild diff dzs.   Diabetes mellitus, type 2 ([facility]/25/2022   10/06/20 A1C 7%   Dizziness 10/11/2019   Encounter for imaging study to confirm orogastric (OG) tube placement    Hematuria 10/26/2020   HFrEF (heart failure with reduced ejection fraction) ([facility]  a. 11/2020 Echo: EF 25-30%; b. 02/2021 Echo: EF 25-30%, glob HK. Mild LVH. Nl RV fxn. Triv AI. Ao root 69m66m History of pulmonary embolus (PE)    HLD (hyperlipidemia) 10/26/2020   Hypertension    Iron deficiency anemia rec'd IV iron 10/26/2020   Ischemic cardiomyopathy    a. 11/2020 Echo: EF 25-30%; b. 02/2021 Echo: EF 25-30%, glob HK.   Left bundle branch block 12/19/2017   Meningioma (HCC)[place]/11/2019   Nonsustained ventricular tachycardia (HCC)[place]/29/2021   Pain of left hip joint 05/29/2020   Personal history of pulmonary embolism 12/19/2017   Pneumonia of both lungs due to  infectious organism    Pulmonary hypertension due to thromboembolism ([place]) 03/09/2018   See echo 12/27/17 with nl PAS vs CTa chest 02/23/18    S/P angioplasty with stent 10/04/20 DES to LCX  10/26/2020   Sinus bradycardia 12/22/2017   SOB (shortness of breath)    Solitary pulmonary nodule on lung CT 03/08/2018   CT 12/04/17 1.0 x 0.8 x 0.7 cm nodular opacity in the RUL vs not seen 03/16/10 posterior segment of the right upper lobe. Marland KitchenSpirometry 03/08/2018    FEV1 3.86 (108%)  Ratio 96 s prior rx  - PET  03/13/18   Low grade  c/w adenoca > rec T surgery eval     STEMI (ST elevation myocardial infarction) ([place]) 10/04/2020   Urinary retention 10/26/2020   Vestibular schwannoma ([place]) 08/14/2020   Past Surgical History:  Procedure Laterality Date   APPENDECTOMY     BIV ICD INSERTION CRT-D N/A 11/03/2021   Procedure: BIV ICD INSERTION CRT-D;  Surgeon: Constance Haw, MD;  Location: [facility];  Service: Cardiovascular;  Laterality: N/A;   CARDIAC CATHETERIZATION     CORONARY ANGIOPLASTY WITH STENT PLACEMENT Left 04/12/2021   LAD stent placement   CORONARY STENT INTERVENTION N/A 04/12/2021   Procedure: CORONARY STENT INTERVENTION;  Surgeon: Nelva Bush, MD;  Location: [facility];  Service: Cardiovascular;  Laterality: N/A;   CORONARY/GRAFT ACUTE MI REVASCULARIZATION N/A 10/04/2020   Procedure: Coronary/Graft Acute MI Revascularization;  Surgeon: Nelva Bush, MD;  Location: [facility];  Service: Cardiovascular;  Laterality: N/A;   HERNIA REPAIR     INTRAVASCULAR PRESSURE WIRE/FFR STUDY N/A 04/12/2021   Procedure: INTRAVASCULAR PRESSURE WIRE/FFR STUDY;  Surgeon: Nelva Bush, MD;  Location: [facility];  Service: Cardiovascular;  Laterality: N/A;   INTRAVASCULAR ULTRASOUND/IVUS N/A 04/12/2021   Procedure: Intravascular Ultrasound/IVUS;  Surgeon: Nelva Bush, MD;  Location: [facility];  Service: Cardiovascular;  Laterality: N/A;   LEFT HEART CATH AND CORONARY ANGIOGRAPHY N/A 10/04/2020   Procedure: LEFT HEART CATH AND CORONARY ANGIOGRAPHY;  Surgeon: Nelva Bush, MD;  Location: [facility];  Service: Cardiovascular;  Laterality: N/A;   RIGHT/LEFT HEART CATH AND CORONARY ANGIOGRAPHY N/A 04/12/2021   Procedure: RIGHT/LEFT HEART CATH AND CORONARY ANGIOGRAPHY;  Surgeon: Nelva Bush, MD;  Location: [facility];  Service: Cardiovascular;  Laterality: N/A;     Current Outpatient Medications  Medication Sig Dispense Refill   acetaminophen  (TYLENOL) 325 MG tablet Take 1-2 tablets (325-650 mg total) by mouth every 4 (four) hours as needed for mild pain.     aspirin EC 81 MG tablet Take 81 mg by mouth every other day. Swallow whole.     atorvastatin (LIPITOR) 80 MG tablet Take 1 tablet (80 mg total) by mouth daily. 90 tablet 3   B Complex-C (SUPER B COMPLEX PO) Take 1 tablet by mouth daily. Unknown strength     diclofenac Sodium (VOLTAREN) 1 % GEL Apply 4 g topically 4 (four) times daily. (Patient taking differently: Apply 4 g topically 4 (four) times daily. Using as needed) 100 g 0   fluticasone (FLONASE) 50 MCG/ACT nasal spray Place 1-2 sprays into both nostrils at bedtime.  2   hydrALAZINE (APRESOLINE) 10 MG tablet Take 1 tablet (10 mg total) by mouth 3 (three) times daily. 270 tablet 3   isosorbide dinitrate (ISORDIL) 30 MG tablet Take 1 tablet (30 mg total) by mouth 2 (two) times daily. 180 tablet 2   melatonin 5 MG  TABS Take 1 tablet (5 mg total) by mouth at bedtime. 30 tablet 0   metoprolol succinate (TOPROL-XL) 50 MG 24 hr tablet Take 1 tablet (50 mg total) by mouth daily. Take with or immediately following a meal. 90 tablet 3   Multiple Vitamin (MULTIVITAMIN WITH MINERALS) TABS tablet Take 1 tablet by mouth daily.     senna-docusate (SENOKOT-S) 8.6-50 MG tablet Take 1 tablet by mouth at bedtime as needed for mild constipation.     sodium bicarbonate 650 MG tablet Take 650 mg by mouth 2 (two) times daily.     mexiletine (MEXITIL) 250 MG capsule Take 1 capsule (250 mg total) by mouth 3 (three) times daily. (Patient not taking: Reported on 07/11/2022) 60 capsule 3   No current facility-administered medications for this visit.    Allergies:   Patient has no known allergies.   Social History:  The patient  reports that he quit smoking about 53 years ago. His smoking use included cigarettes. He has a 2.50 pack-year smoking history. He has never used smokeless tobacco. He reports current alcohol use. He reports that he does not use  drugs.   Family History:  The patient's family history includes Brain cancer in his sister; Diabetes in his mother; Leukemia in his brother; Melanoma in his brother.   ROS:  Please see the history of present illness.   Otherwise, review of systems is positive for none.   All other systems are reviewed and negative.   PHYSICAL EXAM: VS:  BP 112/60   Pulse 64   Ht '6\' 1"'$  (1.854 m)   Wt 221 lb 9.6 oz (100.5 kg)   SpO2 93%   BMI 29.24 kg/m  , BMI Body mass index is 29.24 kg/m. GEN: Well nourished, well developed, in no acute distress  HEENT: normal  Neck: no JVD, carotid bruits, or masses Cardiac: RRR; no murmurs, rubs, or gallops,no edema  Respiratory:  clear to auscultation bilaterally, normal work of breathing GI: soft, nontender, nondistended, + BS MS: no deformity or atrophy  Skin: warm and dry, device site well healed Neuro:  Strength and sensation are intact Psych: euthymic mood, full affect  EKG:  EKG is ordered today. Personal review of the ekg ordered shows sinus rhythm, ventricular paced  Personal review of the device interrogation today. Results in [place]: 01/25/2022: Magnesium 2.0 04/08/2022: NT-Pro BNP 3,220 05/01/2022: ALT 15; BUN 19; Creatinine, Ser 2.00; Hemoglobin 10.8; Platelets 278; Potassium 4.3; Sodium 136    Lipid Panel     Component Value Date/Time   CHOL 84 04/13/2021 0317   CHOL 247 (H) 06/01/2020 0858   TRIG 201 (H) 04/13/2021 0317   HDL 26 (L) 04/13/2021 0317   HDL 33 (L) 06/01/2020 0858   CHOLHDL 3.2 04/13/2021 0317   VLDL 40 04/13/2021 0317   LDLCALC 18 04/13/2021 0317   LDLCALC 151 (H) 06/01/2020 0858     Wt Readings from Last 3 Encounters:  07/11/22 221 lb 9.6 oz (100.5 kg)  06/13/22 221 lb 1.3 oz (100.3 kg)  05/19/22 219 lb (99.3 kg)      Other studies Reviewed: Additional studies/ records that were reviewed today include: LHC 04/12/21  Review of the above records today demonstrates:  Severe single-vessel coronary  artery disease with multifocal disease of the proximal and mid LAD of up to 80% that is hemodynamically significant by RFR and IVUS criteria. Moderate LCx disease with 50% de novo stenosis proximal to stent placed in 09/2020.  There  is also 40% in-stent restenosis in the LCx/OM2 stent.  These lesions are not hemodynamically significant (RFR = 0.98). Mildly elevated left heart, right heart, and pulmonary artery pressures. Normal Fick cardiac output/index. Successful IVUS- and RFR-guided PCI to 80% mid LAD stenosis using Onyx Frontier 2.5 x 30 mm drug-eluting stent (postdilated to 3.2 mm) with 0% residual stenosis and TIMI-3 flow.   TTE 07/16/21  1. Left ventricular ejection fraction, by estimation, is 30 to 35%. The  left ventricle has moderately decreased function. The left ventricle has  no regional wall motion abnormalities. Left ventricular diastolic  parameters are indeterminate.   2. Right ventricular systolic function is normal. The right ventricular  size is normal.   3. The mitral valve is normal in structure. No evidence of mitral valve  regurgitation. No evidence of mitral stenosis.   4. The aortic valve is normal in structure. Aortic valve regurgitation is  trivial. No aortic stenosis is present.   5. Aneurysm of the ascending aorta, measuring 44 mm.   6. The inferior vena cava is normal in size with greater than 50%  respiratory variability, suggesting right atrial pressure of 3 mmHg.  ASSESSMENT AND PLAN:  1.  Chronic systolic heart failure: Due to ischemic cardiomyopathy.  Most recent echo with an ejection fraction of 30 to 35%.  Is status post Medtronic CRT-P implanted 11/03/2021.  Currently on optimal medical therapy.  Device functioning appropriately.  No changes at this time.  2.  Coronary artery disease: Status post circumflex and LAD stents.  No current chest pain.  Plan per primary cardiology.  3.  PVCs: Elevated burden based on percent CRT pacing.  Currently on  amiodarone 200 mg daily.  His PVCs are significantly reduced.  His CRT pacing is at 95%.  He felt that he was having symptoms of dizziness potentially caused by amiodarone, but with further questioning it seems that this is more vertigo.  He Addelynn Batte discuss this further with his primary physician.  We Kierra Jezewski have him restart his amiodarone at 100 mg daily and reduce his Toprol-XL to 25 mg.   Current medicines are reviewed at length with the patient today.   The patient does not have concerns regarding his medicines.  The following changes were made today: Amiodarone 100 mg daily, Toprol-XL 25 mg daily  Labs/ tests ordered today include:  Orders Placed This Encounter  Procedures   EKG 12-Lead     Disposition:   FU 6 weeks  Signed, Jordyn Doane Meredith Leeds, MD  07/11/2022 12:10 PM     [facility] [address] [place] [address] [phone] (office) [phone] (fax)

## 2022-07-11 NOTE — Telephone Encounter (Signed)
Pt c/o medication issue:  1. Name of Medication:  hydrALAZINE (APRESOLINE) 10 MG tablet  2. How are you currently taking this medication (dosage and times per day)? Take 1 tablet (10 mg total) by mouth 3 (three) times daily.  3. Are you having a reaction (difficulty breathing--STAT)? no  4. What is your medication issue? Patient states medication is making him dizzy.

## 2022-07-11 NOTE — Telephone Encounter (Signed)
*  STAT* If patient is at the pharmacy, call can be transferred to refill team.   1. Which medications need to be refilled? (please list name of each medication and dose if known) metoprolol succinate (TOPROL XL) 50 MG 24 hr tablet  2. Which pharmacy/location (including street and city if local pharmacy) is medication to be sent to? [id number] [place], [place] - [address]. Do they need a 30 day or 90 day supply? 90 day

## 2022-07-12 NOTE — Telephone Encounter (Signed)
Spoke with patient notified of the following. He said he has stop this and sx's resolve. Med list updated.

## 2022-07-13 DIAGNOSIS — Z23 Encounter for immunization: Secondary | ICD-10-CM | POA: Diagnosis not present

## 2022-07-15 DIAGNOSIS — I5022 Chronic systolic (congestive) heart failure: Secondary | ICD-10-CM | POA: Diagnosis not present

## 2022-07-15 DIAGNOSIS — R42 Dizziness and giddiness: Secondary | ICD-10-CM | POA: Diagnosis not present

## 2022-07-15 DIAGNOSIS — Z95820 Peripheral vascular angioplasty status with implants and grafts: Secondary | ICD-10-CM | POA: Diagnosis not present

## 2022-07-15 DIAGNOSIS — R269 Unspecified abnormalities of gait and mobility: Secondary | ICD-10-CM | POA: Diagnosis not present

## 2022-07-15 DIAGNOSIS — I255 Ischemic cardiomyopathy: Secondary | ICD-10-CM | POA: Diagnosis not present

## 2022-07-15 DIAGNOSIS — Z950 Presence of cardiac pacemaker: Secondary | ICD-10-CM | POA: Diagnosis not present

## 2022-07-15 DIAGNOSIS — Z7901 Long term (current) use of anticoagulants: Secondary | ICD-10-CM | POA: Diagnosis not present

## 2022-07-15 DIAGNOSIS — I2510 Atherosclerotic heart disease of native coronary artery without angina pectoris: Secondary | ICD-10-CM | POA: Diagnosis not present

## 2022-07-15 DIAGNOSIS — R296 Repeated falls: Secondary | ICD-10-CM | POA: Diagnosis not present

## 2022-07-15 DIAGNOSIS — Z8669 Personal history of other diseases of the nervous system and sense organs: Secondary | ICD-10-CM | POA: Diagnosis not present

## 2022-07-15 DIAGNOSIS — Z86711 Personal history of pulmonary embolism: Secondary | ICD-10-CM | POA: Diagnosis not present

## 2022-07-15 DIAGNOSIS — I7121 Aneurysm of the ascending aorta, without rupture: Secondary | ICD-10-CM | POA: Diagnosis not present

## 2022-07-20 DIAGNOSIS — M79675 Pain in left toe(s): Secondary | ICD-10-CM | POA: Diagnosis not present

## 2022-07-20 DIAGNOSIS — M79674 Pain in right toe(s): Secondary | ICD-10-CM | POA: Diagnosis not present

## 2022-07-20 DIAGNOSIS — B351 Tinea unguium: Secondary | ICD-10-CM | POA: Diagnosis not present

## 2022-07-20 DIAGNOSIS — L600 Ingrowing nail: Secondary | ICD-10-CM | POA: Diagnosis not present

## 2022-07-25 ENCOUNTER — Telehealth: Payer: Self-pay | Admitting: Cardiology

## 2022-07-25 DIAGNOSIS — I5022 Chronic systolic (congestive) heart failure: Secondary | ICD-10-CM

## 2022-07-25 NOTE — Telephone Encounter (Signed)
Spoke with pt and advised that the medication was dc'd 01/25/22 while he was admitted into the hospital. Pt states that he has been taking the medication and his BP is usually around 110/70. Pt would like to know if he should continue or dc it. Please advise.

## 2022-07-25 NOTE — Telephone Encounter (Signed)
Pt c/o medication issue:  1. Name of Medication: Valsartin 160 MG  2. How are you currently taking this medication (dosage and times per day)?   3. Are you having a reaction (difficulty breathing--STAT)? No  4. What is your medication issue? Pt would like to know why medication was discontinued. Please advise

## 2022-07-26 DIAGNOSIS — I5022 Chronic systolic (congestive) heart failure: Secondary | ICD-10-CM | POA: Diagnosis not present

## 2022-07-26 NOTE — Telephone Encounter (Signed)
Recommendations reviewed with pt as per Dr. Krasowski's note.  Pt verbalized understanding and had no additional questions.  

## 2022-07-26 NOTE — Addendum Note (Signed)
Addended by: Truddie Hidden on: 07/26/2022 11:15 AM   Modules accepted: Orders

## 2022-07-27 LAB — BASIC METABOLIC PANEL
BUN/Creatinine Ratio: 11 (ref 10–24)
BUN: 25 mg/dL (ref 8–27)
CO2: 21 mmol/L (ref 20–29)
Calcium: 9.1 mg/dL (ref 8.6–10.2)
Chloride: 105 mmol/L (ref 96–106)
Creatinine, Ser: 2.19 mg/dL — ABNORMAL HIGH (ref 0.76–1.27)
Glucose: 154 mg/dL — ABNORMAL HIGH (ref 70–99)
Potassium: 4.4 mmol/L (ref 3.5–5.2)
Sodium: 141 mmol/L (ref 134–144)
eGFR: 30 mL/min/{1.73_m2} — ABNORMAL LOW (ref >59)

## 2022-07-28 DIAGNOSIS — Z950 Presence of cardiac pacemaker: Secondary | ICD-10-CM | POA: Diagnosis not present

## 2022-07-28 DIAGNOSIS — D320 Benign neoplasm of cerebral meninges: Secondary | ICD-10-CM | POA: Diagnosis not present

## 2022-08-09 ENCOUNTER — Ambulatory Visit (INDEPENDENT_AMBULATORY_CARE_PROVIDER_SITE_OTHER): Payer: Medicare Other

## 2022-08-09 DIAGNOSIS — I255 Ischemic cardiomyopathy: Secondary | ICD-10-CM | POA: Diagnosis not present

## 2022-08-10 LAB — CUP PACEART REMOTE DEVICE CHECK
Battery Remaining Longevity: 109 mo
Battery Voltage: 3.02 V
Brady Statistic AP VP Percent: 9.87 %
Brady Statistic AP VS Percent: 0.05 %
Brady Statistic AS VP Percent: 88.67 %
Brady Statistic AS VS Percent: 1.41 %
Brady Statistic RA Percent Paced: 9.92 %
Brady Statistic RV Percent Paced: 91.29 %
Date Time Interrogation Session: 20231129131504
HighPow Impedance: 65 Ohm
Implantable Lead Connection Status: 753985
Implantable Lead Connection Status: 753985
Implantable Lead Connection Status: 753985
Implantable Lead Implant Date: 20230222
Implantable Lead Implant Date: 20230222
Implantable Lead Implant Date: 20230222
Implantable Lead Location: 753858
Implantable Lead Location: 753859
Implantable Lead Location: 753860
Implantable Lead Model: 4798
Implantable Lead Model: 5076
Implantable Pulse Generator Implant Date: 20230222
Lead Channel Impedance Value: 199.5 Ohm
Lead Channel Impedance Value: 208.568
Lead Channel Impedance Value: 212.8 Ohm
Lead Channel Impedance Value: 212.8 Ohm
Lead Channel Impedance Value: 223.149
Lead Channel Impedance Value: 399 Ohm
Lead Channel Impedance Value: 399 Ohm
Lead Channel Impedance Value: 399 Ohm
Lead Channel Impedance Value: 437 Ohm
Lead Channel Impedance Value: 456 Ohm
Lead Channel Impedance Value: 570 Ohm
Lead Channel Impedance Value: 589 Ohm
Lead Channel Impedance Value: 627 Ohm
Lead Channel Impedance Value: 703 Ohm
Lead Channel Impedance Value: 760 Ohm
Lead Channel Impedance Value: 779 Ohm
Lead Channel Impedance Value: 779 Ohm
Lead Channel Impedance Value: 779 Ohm
Lead Channel Pacing Threshold Amplitude: 0.5 V
Lead Channel Pacing Threshold Amplitude: 0.625 V
Lead Channel Pacing Threshold Amplitude: 1 V
Lead Channel Pacing Threshold Pulse Width: 0.4 ms
Lead Channel Pacing Threshold Pulse Width: 0.4 ms
Lead Channel Pacing Threshold Pulse Width: 0.4 ms
Lead Channel Sensing Intrinsic Amplitude: 1 mV
Lead Channel Sensing Intrinsic Amplitude: 1 mV
Lead Channel Sensing Intrinsic Amplitude: 16.375 mV
Lead Channel Sensing Intrinsic Amplitude: 16.375 mV
Lead Channel Setting Pacing Amplitude: 1.5 V
Lead Channel Setting Pacing Amplitude: 1.5 V
Lead Channel Setting Pacing Amplitude: 2 V
Lead Channel Setting Pacing Pulse Width: 0.4 ms
Lead Channel Setting Pacing Pulse Width: 0.4 ms
Lead Channel Setting Sensing Sensitivity: 0.3 mV
Zone Setting Status: 755011
Zone Setting Status: 755011

## 2022-08-11 ENCOUNTER — Telehealth: Payer: Self-pay | Admitting: Cardiology

## 2022-08-11 NOTE — Telephone Encounter (Signed)
Pt states that his plan does not cover Mexiletine and he had to pay $60/30 days, and he cannot afford this.   We discussed his dizziness and trouble walking. States he has only taken 2 doses of Mexiletine. He is not taking Amiodarone, and I am not sure why.  Aware I am going to look into this further and speak w/ Camnitz further. Pt will hold medication until he hears back from our office. Patient verbalized understanding and agreeable to plan.

## 2022-08-11 NOTE — Telephone Encounter (Signed)
Pt c/o medication issue:  1. Name of Medication: mexiletine (MEXITIL) 250 MG capsule   2. How are you currently taking this medication (dosage and times per day)?   Take 1 capsule (250 mg total) by mouth 3 (three) times daily.    3. Are you having a reaction (difficulty breathing--STAT)? no  4. What is your medication issue? Patient states it is causing dizziness, weakness he has trouble walking.  He has to use a walker to walk.

## 2022-08-17 DIAGNOSIS — L600 Ingrowing nail: Secondary | ICD-10-CM | POA: Diagnosis not present

## 2022-08-17 DIAGNOSIS — M79675 Pain in left toe(s): Secondary | ICD-10-CM | POA: Diagnosis not present

## 2022-08-17 DIAGNOSIS — M79674 Pain in right toe(s): Secondary | ICD-10-CM | POA: Diagnosis not present

## 2022-08-17 DIAGNOSIS — B351 Tinea unguium: Secondary | ICD-10-CM | POA: Diagnosis not present

## 2022-08-19 ENCOUNTER — Other Ambulatory Visit: Payer: Self-pay | Admitting: Thoracic Surgery (Cardiothoracic Vascular Surgery)

## 2022-08-19 DIAGNOSIS — I7121 Aneurysm of the ascending aorta, without rupture: Secondary | ICD-10-CM

## 2022-08-22 NOTE — Progress Notes (Unsigned)
Cardiology Office Note Date:  08/22/2022  Patient ID:  Timothy Moran, DOB 10/12/1945, MRN [id number] PCP:  Timothy Caraway, MD  Cardiologist:  Dr. Agustin Moran Electrophysiologist: Dr. Curt Moran    Chief Complaint: *** planned f/u  History of Present Illness: Timothy Moran is a 76 y.o. male with history of CAD (STEMI associated with VF arrest, PCI Aug 2022 with intervention/PCI  mid LAD), ICM, chronic CHF (systolic), LBBB, ICD, HTN, HLD, ascending Ao aneurysm, NSVT, CKD (III), DM meningioma s/p gamma knife radiation  He was hospitalized 01/22/2022 - 01/25/22  sided weakness following a fall at home. MRI of the brain as well as C-spine without acute findings, but showed his known meningiomas with slightly worsening surrounding edema.   Neurology and neurosurgery were consulted.  He was treated with IV steroids and subsequent oral steroid taper.  Cardiology was consulted due to possible atrial fibrillation, however, EKG and telemetry showed sinus rhythm with PACs andnot formally seen.   Discharged to Inpatient medicine and rehab due to physical deconditioning, right hemiparalysis.   Subsequent MRI of the shoulder showed right rotator cuff tear.   ARB was discontinued in the setting of acute renal failure.    He saw [name], NP 02/22/22 for pre-op evaluation prior to a urology procedure, no cardiac/anginal sounding symptoms.  Felt to be an acceptable risk for the planned procedure.  He saw Dr. Curt Moran 02/28/22, discussed a trip/fall accident that required rehab and was recovering.  Also deemed an acceptable risk for his urological procedure. No changes made  03/16/22 there is an in clinic device check with OptiVol way up but coming down, 83% BP and effective BP% Noting frequent PACs falling in refractory causing AS/VS , felt to be causing his reduction in BP% and his PVARP changed from auto to 213m in effort to sense these and track them  Pt called 04/05/22 with c/o palpitations, normal  device remote reported, Dr. CCurt Bearsreviewed and noted PACs recommended OV w/EP APP.   He called [name]7/27/23 with SOB, reported a couple weeks worth, tired, low BPs  Planned for labs, Dr. KOrpah Clintonreport note comments on worsening kidney function and elevated K+ with evidence of HF, planned to repeat labs  Called device our office again, 04/11/22, wanted to get his ICD checked, programmed, 2/2 , advised to keep his scheduled appt.  04/08/22  K+ 5.3 BUN/Creat 17/2.23 BNP 3220  04/14/22  K+ 4.7 BUN/Creat 18/2.06  Baseline Creat looks best 1.8, mostly in the low 2's...   I saw him 04/19/22 He is accompanied by his wife. Reports an awareness of strong/irregular heartbeats, not clearly associated with symptoms. Fatigue seems to be new for about 2 mo, tired all of the time, no energy Not lightheaded, not fainting But easily winded and tired. Wishes he had more energy, tired. He stopped his ASA some time ago 2/2 nose bleeds that were significant and hard to stop No CP (He was maintained on ASA after his hospitalization with the meningioma and he comfirms he has never been asked to stop it because of that) Pending further management of his meningioma, sounds like with radiation tx, not yet scheduled, [place]/BP, but also having PVCs probably what is reducing his BP% (was 90%), and his Toprol increased to try and suppress them if able QRS was 1579m and could consider optimization if needed as well. Advised he resume ASA, he was reluctant, and scheduled to see Dr. KrAgustin Creeoon and suggested he  discuss further with him.   ER visit 05/01/22 after a fall resulting in neck pain, neurosurgery reviewed images (no fractures), recommended soft collar   Saw Dr. Agustin Moran 05/17/22 feeling better, couple falls with orthopedic pains, no cardiac complaints or concerns.  No changes were made.  I saw him 05/19/22 He is accompanied by his wife He did  not tolerate the Toprol '100mg'$  dosing, making him even more tired, napped a lot. Worse energy. Back on '50mg'$  dosing he feels better. They both report the fall leading to the ER was a rip/fall accident.  He had no syncope, not associated with dizziness. He did after that a couple days feel a bit confused or disoriented, though that cleared up. No headaches or focal deficits then or now No CP, palpitations or cardiac complaints Denies SOB They are both pretty happy with how he is doing cardiac-wise, not inclined to want to make any further changes. Outside of body aches/pains, feels pretty good  BP% remained low at 89%, volume status was OK.  Increased pacing did not suppress PVCs, he was having PACs though these were seen and tracked well by the device.  Previouisly did not tolerate increased BB dosing.   Discussed perhaps need for AAD but in d/w pt, held off and he would follow up with Dr. Curt Moran  He saw Dr. Curt Moran 06/13/22, started mexiletine '250mg'$  BID  06/16/22, Pt called with dizziness, nausea, vomiting w/mexiletine and was stopped >> amiodarone (loaded)  At his follow up 07/11/22 with Timothy Moran, BP% up to 95%, pt reported some dizziness that sounded more of vertigo and advised to f/u with his PMD. Amiodarone reduced to '100mg'$  daily and metoprolol to '25mg'$  daily.  Further phone notes with discussion regarding his losartan wether he was taking it or not, but ultimately advised to NOT take it 2/2 renal disease  Phone notes 11/30: discussing mexiletine causing dizziness.  Not taking amiodarone.  Much confusion about what he is doing with his medications.  *** BP% *** poorly tolerates meds *** leave him off?  What does his volume look like? *** dizziness unrelated? orthostatic?    Device information MDT CRT-D implanted 11/03/2021 Has CS lead  Past Medical History:  Diagnosis Date   Acute blood loss anemia 10/05/2020   Acute respiratory failure ([place]), hypothermia therapy, vent -  extubated 10/06/20    AKI (acute kidney injury) ([place]) 10/05/2020   Anoxic brain injury ([place]) 10/26/2020   Arrhythmia    Ascending aortic aneurysm ([place]) 01/29/2018   a. 2019 4.3 cm by echo; b. 02/2021 Echo: Ao root 23m.   Benign brain tumor ([place]    Benign neoplasm of brain ([facility] 12/19/2017   Cardiac arrest with ventricular fibrillation ([facility]    Chest pain 12/04/2017   Chronic kidney disease, stage III (moderate) ([facility] 12/19/2017   CKD (chronic kidney disease), stage III - IV (HCC)    Coronary Artery Disease    a. 14/2706Inf STEMI complicated by cardiac arrest/PCI: LAD 50p/m, LCX 100d (2.5 x 30 Resolute Onyx DES); b. 04/2021 PCI: 04/2021 LM nl, LAD 50p/842m2.5x30 Onyx Frontier DES), LCX 25p, 5068mFR 0.98), 40d ISR (RFR 0.98), OM2 25, RCA mild diff dzs.   Diabetes mellitus, type 2 ([facility]/25/2022   10/06/20 A1C 7%   Dizziness 10/11/2019   Encounter for imaging study to confirm orogastric (OG) tube placement    Hematuria 10/26/2020   HFrEF (heart failure with reduced ejection fraction) ([facility]  a. 11/2020 Echo: EF 25-30%; b. 02/2021 Echo:  EF 25-30%, glob HK. Mild LVH. Nl RV fxn. Triv AI. Ao root 76m.   History of pulmonary embolus (PE)    HLD (hyperlipidemia) 10/26/2020   Hypertension    Iron deficiency anemia rec'd IV iron 10/26/2020   Ischemic cardiomyopathy    a. 11/2020 Echo: EF 25-30%; b. 02/2021 Echo: EF 25-30%, glob HK.   Left bundle branch block 12/19/2017   Meningioma ([place] 08/14/2020   Nonsustained ventricular tachycardia ([facility] 10/11/2019   Pain of left hip joint 05/29/2020   Personal history of pulmonary embolism 12/19/2017   Pneumonia of both lungs due to infectious organism    Pulmonary hypertension due to thromboembolism ([facility] 03/09/2018   See echo 12/27/17 with nl PAS vs CTa chest 02/23/18    S/P angioplasty with stent 10/04/20 DES to LCX  10/26/2020   Sinus bradycardia 12/22/2017   SOB (shortness of breath)    Solitary pulmonary nodule on lung CT 03/08/2018   CT 12/04/17  1.0 x 0.8 x 0.7 cm nodular opacity in the RUL vs not seen 03/16/10 posterior segment of the right upper lobe. .Marland Kitchenpirometry 03/08/2018    FEV1 3.86 (108%)  Ratio 96 s prior rx  - PET  03/13/18   Low grade c/w adenoca > rec T surgery eval     STEMI (ST elevation myocardial infarction) ([facility] 10/04/2020   Urinary retention 10/26/2020   Vestibular schwannoma ([facility] 08/14/2020    Past Surgical History:  Procedure Laterality Date   APPENDECTOMY     BIV ICD INSERTION CRT-D N/A 11/03/2021   Procedure: BIV ICD INSERTION CRT-D;  Surgeon: CConstance Haw MD;  [facility];  Service: Cardiovascular;  Laterality: N/A;   CARDIAC CATHETERIZATION     CORONARY ANGIOPLASTY WITH STENT PLACEMENT Left 04/12/2021   LAD stent placement   CORONARY STENT INTERVENTION N/A 04/12/2021   Procedure: CORONARY STENT INTERVENTION;  Surgeon: ENelva Bush MD;  [facility];  Service: Cardiovascular;  Laterality: N/A;   CORONARY/GRAFT ACUTE MI REVASCULARIZATION N/A 10/04/2020   Procedure: Coronary/Graft Acute MI Revascularization;  Surgeon: ENelva Bush MD;  [facility];  Service: Cardiovascular;  Laterality: N/A;   HERNIA REPAIR     INTRAVASCULAR PRESSURE WIRE/FFR STUDY N/A 04/12/2021   Procedure: INTRAVASCULAR PRESSURE WIRE/FFR STUDY;  Surgeon: ENelva Bush MD;  [facility];  Service: Cardiovascular;  Laterality: N/A;   INTRAVASCULAR ULTRASOUND/IVUS N/A 04/12/2021   Procedure: Intravascular Ultrasound/IVUS;  Surgeon: ENelva Bush MD;  [facility];  Service: Cardiovascular;  Laterality: N/A;   LEFT HEART CATH AND CORONARY ANGIOGRAPHY N/A 10/04/2020   Procedure: LEFT HEART CATH AND CORONARY ANGIOGRAPHY;  Surgeon: ENelva Bush MD;  [facility];  Service: Cardiovascular;  Laterality: N/A;   RIGHT/LEFT HEART CATH AND CORONARY ANGIOGRAPHY N/A 04/12/2021   Procedure: RIGHT/LEFT HEART CATH AND CORONARY ANGIOGRAPHY;   Surgeon: ENelva Bush MD;  [facility];  Service: Cardiovascular;  Laterality: N/A;    Current Outpatient Medications  Medication Sig Dispense Refill   acetaminophen (TYLENOL) 325 MG tablet Take 1-2 tablets (325-650 mg total) by mouth every 4 (four) hours as needed for mild pain.     amiodarone (PACERONE) 200 MG tablet Take 0.5 tablets (100 mg total) by mouth daily. 45 tablet 3   aspirin EC 81 MG tablet Take 81 mg by mouth every other day. Swallow whole.     atorvastatin (LIPITOR) 80 MG tablet Take 1 tablet (80 mg total) by mouth daily. 9[place]  tablet 3   B Complex-C (SUPER B COMPLEX PO) Take 1 tablet by mouth daily. Unknown strength     diclofenac Sodium (VOLTAREN) 1 % GEL Apply 4 g topically 4 (four) times daily. (Patient taking differently: Apply 4 g topically 4 (four) times daily. Using as needed) 100 g 0   fluticasone (FLONASE) 50 MCG/ACT nasal spray Place 1-2 sprays into both nostrils at bedtime.  2   isosorbide dinitrate (ISORDIL) 30 MG tablet Take 1 tablet (30 mg total) by mouth 2 (two) times daily. 180 tablet 2   melatonin 5 MG TABS Take 1 tablet (5 mg total) by mouth at bedtime. 30 tablet 0   metoprolol succinate (TOPROL XL) 25 MG 24 hr tablet Take 1 tablet (25 mg total) by mouth daily. 90 tablet 3   mexiletine (MEXITIL) 250 MG capsule Take 1 capsule (250 mg total) by mouth 3 (three) times daily. (Patient not taking: Reported on 07/11/2022) 60 capsule 3   Multiple Vitamin (MULTIVITAMIN WITH MINERALS) TABS tablet Take 1 tablet by mouth daily.     senna-docusate (SENOKOT-S) 8.6-50 MG tablet Take 1 tablet by mouth at bedtime as needed for mild constipation.     sodium bicarbonate 650 MG tablet Take 650 mg by mouth 2 (two) times daily.     No current facility-administered medications for this visit.    Allergies:   Hydralazine   Social History:  The patient  reports that he quit smoking about 53 years ago. His smoking use included cigarettes. He has a 2.50 pack-year  smoking history. He has never used smokeless tobacco. He reports current alcohol use. He reports that he does not use drugs.   Family History:  The patient's family history includes Brain cancer in his sister; Diabetes in his mother; Leukemia in his brother; Melanoma in his brother.  ROS:  Please see the history of present illness.    All other systems are reviewed and otherwise negative.   PHYSICAL EXAM:  VS:  There were no vitals taken for this visit. BMI: There is no height or weight on file to calculate BMI. Well nourished, well developed, in no acute distress HEENT: normocephalic, atraumatic Neck: no JVD, carotid bruits or masses Cardiac:   *** RRR; a couple extrasystoles, no significant murmurs, no rubs, or gallops Lungs:  *** CTA b/l, no wheezing, rhonchi or rales Abd: soft, nontender MS: no deformity or atrophy Ext: *** no edema Skin: warm and dry, no rash Neuro:  No gross deficits appreciated Psych: euthymic mood, full affect  ICD site is stable, no tethering or discomfort   EKG:  not done today  Device interrogation done today and reviewed by myself:  ***   05/24/22: TTE 1. Left ventricular ejection fraction, by estimation, is 30 to 35%. The  left ventricle has moderately decreased function. The left ventricle has  no regional wall motion abnormalities. Left ventricular diastolic  parameters are consistent with Grade I  diastolic dysfunction (impaired relaxation).   2. Right ventricular systolic function is mildly reduced. The right  ventricular size is normal. There is normal pulmonary artery systolic  pressure.   3. Left atrial size was mildly dilated.   4. The mitral valve is degenerative. Mild to moderate mitral valve  regurgitation. No evidence of mitral stenosis.   5. The aortic valve is normal in structure. Aortic valve regurgitation is  mild. Aortic valve sclerosis is present, with no evidence of aortic valve  stenosis.   6. There is moderate dilatation of  the  ascending aorta, measuring 44 mm.   7. The inferior vena cava is normal in size with greater than 50%  respiratory variability, suggesting right atrial pressure of 3 mmHg.   LHC 04/12/21  Review of the above records today demonstrates:  Severe single-vessel coronary artery disease with multifocal disease of the proximal and mid LAD of up to 80% that is hemodynamically significant by RFR and IVUS criteria. Moderate LCx disease with 50% de novo stenosis proximal to stent placed in 09/2020.  There is also 40% in-stent restenosis in the LCx/OM2 stent.  These lesions are not hemodynamically significant (RFR = 0.98). Mildly elevated left heart, right heart, and pulmonary artery pressures. Normal Fick cardiac output/index. Successful IVUS- and RFR-guided PCI to 80% mid LAD stenosis using Onyx Frontier 2.5 x 30 mm drug-eluting stent (postdilated to 3.2 mm) with 0% residual stenosis and TIMI-3 flow.   TTE 07/16/21  1. Left ventricular ejection fraction, by estimation, is 30 to 35%. The  left ventricle has moderately decreased function. The left ventricle has  no regional wall motion abnormalities. Left ventricular diastolic  parameters are indeterminate.   2. Right ventricular systolic function is normal. The right ventricular  size is normal.   3. The mitral valve is normal in structure. No evidence of mitral valve  regurgitation. No evidence of mitral stenosis.   4. The aortic valve is normal in structure. Aortic valve regurgitation is  trivial. No aortic stenosis is present.   5. Aneurysm of the ascending aorta, measuring 44 mm.   6. The inferior vena cava is normal in size with greater than 50%  respiratory variability, suggesting right atrial pressure of 3 mmHg.   Recent Labs: 01/25/2022: Magnesium 2.0 04/08/2022: NT-Pro BNP 3,220 05/01/2022: ALT 15; Hemoglobin 10.8; Platelets 278 07/26/2022: BUN 25; Creatinine, Ser 2.19; Potassium 4.4; Sodium 141  No results found for requested labs within  last 365 days.   CrCl cannot be calculated (Patient's most recent lab result is older than the maximum 21 days allowed.).   Wt Readings from Last 3 Encounters:  07/11/22 221 lb 9.6 oz (100.5 kg)  06/13/22 221 lb 1.3 oz (100.3 kg)  05/19/22 219 lb (99.3 kg)     Other studies reviewed: Additional studies/records reviewed today include: summarized above  ASSESSMENT AND PLAN:  ICD *** Intact function *** No programming changes made today   CAD *** No anginal symptoms C/w Dr. Agustin Moran   ICM Chronic CHF (systolic) *** BP and effective BP is *** % *** PVCs *** Optivol looks good *** No symptoms or exam findings of volume OL Dr. Agustin Moran     Disposition: ***   Current medicines are reviewed at length with the patient today.  The patient did not have any concerns regarding medicines.  Venetia Night, PA-C 08/22/2022 7:44 AM     [address] [place] [address] [phone] (office)  [phone] (fax)

## 2022-08-24 ENCOUNTER — Encounter: Payer: Self-pay | Admitting: Physician Assistant

## 2022-08-24 ENCOUNTER — Ambulatory Visit: Payer: Medicare Other | Attending: Physician Assistant | Admitting: Physician Assistant

## 2022-08-24 VITALS — BP 126/60 | HR 56 | Ht 73.0 in | Wt 219.6 lb

## 2022-08-24 DIAGNOSIS — I493 Ventricular premature depolarization: Secondary | ICD-10-CM | POA: Diagnosis not present

## 2022-08-24 DIAGNOSIS — I5022 Chronic systolic (congestive) heart failure: Secondary | ICD-10-CM

## 2022-08-24 DIAGNOSIS — I491 Atrial premature depolarization: Secondary | ICD-10-CM

## 2022-08-24 DIAGNOSIS — I255 Ischemic cardiomyopathy: Secondary | ICD-10-CM | POA: Insufficient documentation

## 2022-08-24 DIAGNOSIS — Z95810 Presence of automatic (implantable) cardiac defibrillator: Secondary | ICD-10-CM

## 2022-08-24 DIAGNOSIS — Z79899 Other long term (current) drug therapy: Secondary | ICD-10-CM | POA: Diagnosis not present

## 2022-08-24 LAB — CUP PACEART INCLINIC DEVICE CHECK
Battery Remaining Longevity: 108 mo
Battery Voltage: 3.01 V
Brady Statistic AP VP Percent: 19.47 %
Brady Statistic AP VS Percent: 0.07 %
Brady Statistic AS VP Percent: 79.27 %
Brady Statistic AS VS Percent: 1.19 %
Brady Statistic RA Percent Paced: 19.53 %
Brady Statistic RV Percent Paced: 92.93 %
Date Time Interrogation Session: 20231213165516
HighPow Impedance: 69 Ohm
Implantable Lead Connection Status: 753985
Implantable Lead Connection Status: 753985
Implantable Lead Connection Status: 753985
Implantable Lead Implant Date: 20230222
Implantable Lead Implant Date: 20230222
Implantable Lead Implant Date: 20230222
Implantable Lead Location: 753858
Implantable Lead Location: 753859
Implantable Lead Location: 753860
Implantable Lead Model: 4798
Implantable Lead Model: 5076
Implantable Pulse Generator Implant Date: 20230222
Lead Channel Impedance Value: 220.723
Lead Channel Impedance Value: 224.438
Lead Channel Impedance Value: 234.706
Lead Channel Impedance Value: 251.66 Ohm
Lead Channel Impedance Value: 270 Ohm
Lead Channel Impedance Value: 380 Ohm
Lead Channel Impedance Value: 399 Ohm
Lead Channel Impedance Value: 494 Ohm
Lead Channel Impedance Value: 513 Ohm
Lead Channel Impedance Value: 570 Ohm
Lead Channel Impedance Value: 589 Ohm
Lead Channel Impedance Value: 665 Ohm
Lead Channel Impedance Value: 665 Ohm
Lead Channel Impedance Value: 817 Ohm
Lead Channel Impedance Value: 836 Ohm
Lead Channel Impedance Value: 836 Ohm
Lead Channel Impedance Value: 855 Ohm
Lead Channel Impedance Value: 912 Ohm
Lead Channel Pacing Threshold Amplitude: 0.5 V
Lead Channel Pacing Threshold Amplitude: 0.625 V
Lead Channel Pacing Threshold Amplitude: 1.125 V
Lead Channel Pacing Threshold Pulse Width: 0.4 ms
Lead Channel Pacing Threshold Pulse Width: 0.4 ms
Lead Channel Pacing Threshold Pulse Width: 0.4 ms
Lead Channel Sensing Intrinsic Amplitude: 0.875 mV
Lead Channel Sensing Intrinsic Amplitude: 1.25 mV
Lead Channel Sensing Intrinsic Amplitude: 17.5 mV
Lead Channel Sensing Intrinsic Amplitude: 18.25 mV
Lead Channel Setting Pacing Amplitude: 1.5 V
Lead Channel Setting Pacing Amplitude: 1.75 V
Lead Channel Setting Pacing Amplitude: 2 V
Lead Channel Setting Pacing Pulse Width: 0.4 ms
Lead Channel Setting Pacing Pulse Width: 0.4 ms
Lead Channel Setting Sensing Sensitivity: 0.3 mV
Zone Setting Status: 755011
Zone Setting Status: 755011

## 2022-08-24 NOTE — Patient Instructions (Signed)
Medication Instructions:   Your physician recommends that you continue on your current medications as directed. Please refer to the Current Medication list given to you today.  *If you need a refill on your cardiac medications before your next appointment, please call your pharmacy*   Lab Work:  CMET AND TSH TODAY   If you have labs (blood work) drawn today and your tests are completely normal, you will receive your results only by: [place] (if you have MyChart) OR A paper copy in the mail If you have any lab test that is abnormal or we need to change your treatment, we will call you to review the results.   Testing/Procedures: NONE ORDERED  TODAY     Follow-Up: At [facility], you and your health needs are our priority.  As part of our continuing mission to provide you with exceptional heart care, we have created designated Provider Care Teams.  These Care Teams include your primary Cardiologist (physician) and Advanced Practice Providers (APPs -  Physician Assistants and Nurse Practitioners) who all work together to provide you with the care you need, when you need it.  We recommend signing up for the patient portal called "MyChart".  Sign up information is provided on this After Visit Summary.  MyChart is used to connect with patients for Virtual Visits (Telemedicine).  Patients are able to view lab/test results, encounter notes, upcoming appointments, etc.  Non-urgent messages can be sent to your provider as well.   To learn more about what you can do with MyChart, go to [email or web address].    Your next appointment:   4 month(s)  The format for your next appointment:   In Person  Provider:   You may see Will Meredith Leeds, MD or one of the following Advanced Practice Providers on your designated Care Team:   Tommye Standard, [place]  Other Instructions   Important Information About Sugar

## 2022-08-25 DIAGNOSIS — R3912 Poor urinary stream: Secondary | ICD-10-CM | POA: Diagnosis not present

## 2022-08-25 DIAGNOSIS — N401 Enlarged prostate with lower urinary tract symptoms: Secondary | ICD-10-CM | POA: Diagnosis not present

## 2022-08-25 LAB — COMPREHENSIVE METABOLIC PANEL
ALT: 19 IU/L (ref 0–44)
AST: 20 IU/L (ref 0–40)
Albumin/Globulin Ratio: 1.4 (ref 1.2–2.2)
Albumin: 4.1 g/dL (ref 3.8–4.8)
Alkaline Phosphatase: 91 IU/L (ref 44–121)
BUN/Creatinine Ratio: 10 (ref 10–24)
BUN: 21 mg/dL (ref 8–27)
Bilirubin Total: 0.2 mg/dL (ref 0.0–1.2)
CO2: 20 mmol/L (ref 20–29)
Calcium: 9.2 mg/dL (ref 8.6–10.2)
Chloride: 106 mmol/L (ref 96–106)
Creatinine, Ser: 2.15 mg/dL — ABNORMAL HIGH (ref 0.76–1.27)
Globulin, Total: 2.9 g/dL (ref 1.5–4.5)
Glucose: 110 mg/dL — ABNORMAL HIGH (ref 70–99)
Potassium: 4.6 mmol/L (ref 3.5–5.2)
Sodium: 144 mmol/L (ref 134–144)
Total Protein: 7 g/dL (ref 6.0–8.5)
eGFR: 31 mL/min/{1.73_m2} — ABNORMAL LOW (ref >59)

## 2022-08-25 LAB — TSH: TSH: 4.62 u[IU]/mL — ABNORMAL HIGH (ref 0.450–4.500)

## 2022-08-25 NOTE — Telephone Encounter (Signed)
Lab order for DOS 04/08/2022 faxed with corrected dx codes. Form processed to scan.

## 2022-09-03 ENCOUNTER — Other Ambulatory Visit: Payer: Self-pay | Admitting: Cardiology

## 2022-09-08 NOTE — Progress Notes (Signed)
Remote ICD transmission.   

## 2022-09-13 ENCOUNTER — Ambulatory Visit: Payer: Medicare Other | Admitting: Cardiology

## 2022-09-16 ENCOUNTER — Other Ambulatory Visit: Payer: Self-pay | Admitting: Cardiology

## 2022-09-19 ENCOUNTER — Other Ambulatory Visit: Payer: Self-pay | Admitting: *Deleted

## 2022-09-19 DIAGNOSIS — Z79899 Other long term (current) drug therapy: Secondary | ICD-10-CM

## 2022-09-20 ENCOUNTER — Ambulatory Visit: Payer: Medicare Other | Attending: Family Medicine | Admitting: Physical Therapy

## 2022-09-20 ENCOUNTER — Encounter: Payer: Self-pay | Admitting: Physical Therapy

## 2022-09-20 DIAGNOSIS — R296 Repeated falls: Secondary | ICD-10-CM | POA: Diagnosis not present

## 2022-09-20 DIAGNOSIS — R29898 Other symptoms and signs involving the musculoskeletal system: Secondary | ICD-10-CM | POA: Insufficient documentation

## 2022-09-20 DIAGNOSIS — D329 Benign neoplasm of meninges, unspecified: Secondary | ICD-10-CM | POA: Diagnosis not present

## 2022-09-20 DIAGNOSIS — R278 Other lack of coordination: Secondary | ICD-10-CM | POA: Diagnosis not present

## 2022-09-20 DIAGNOSIS — R2681 Unsteadiness on feet: Secondary | ICD-10-CM | POA: Insufficient documentation

## 2022-09-20 DIAGNOSIS — M6281 Muscle weakness (generalized): Secondary | ICD-10-CM

## 2022-09-20 DIAGNOSIS — R531 Weakness: Secondary | ICD-10-CM | POA: Diagnosis not present

## 2022-09-20 NOTE — Therapy (Signed)
OUTPATIENT PHYSICAL THERAPY THORACOLUMBAR EVALUATION   Patient Name: Timothy Moran MRN: [id number] DOB:1946/04/13, 77 y.o., male Today's Date: 09/20/2022   PT End of Session - 09/20/22 1401     Visit Number 1    Date for PT Re-Evaluation 12/20/22    Authorization Type Medicare    PT Start Time 1359    PT Stop Time 7062    PT Time Calculation (min) 44 min    Equipment Utilized During Treatment Gait belt    Activity Tolerance Patient tolerated treatment well    Behavior During Therapy WFL for tasks assessed/performed              Past Medical History:  Diagnosis Date   Acute blood loss anemia 10/05/2020   Acute respiratory failure ([place]), hypothermia therapy, vent - extubated 10/06/20    AKI (acute kidney injury) ([place]) 10/05/2020   Anoxic brain injury ([place]) 10/26/2020   Arrhythmia    Ascending aortic aneurysm ([place]) 01/29/2018   a. 2019 4.3 cm by echo; b. 02/2021 Echo: Ao root 50m.   Benign brain tumor ([place]    Benign neoplasm of brain ([place] 12/19/2017   Cardiac arrest with ventricular fibrillation ([place]    Chest pain 12/04/2017   Chronic kidney disease, stage III (moderate) ([place] 12/19/2017   CKD (chronic kidney disease), stage III - IV (HCC)    Coronary Artery Disease    a. 13/7628Inf STEMI complicated by cardiac arrest/PCI: LAD 50p/m, LCX 100d (2.5 x 30 Resolute Onyx DES); b. 04/2021 PCI: 04/2021 LM nl, LAD 50p/83m2.5x30 Onyx Frontier DES), LCX 25p, 5014mFR 0.98), 40d ISR (RFR 0.98), OM2 25, RCA mild diff dzs.   Diabetes mellitus, type 2 ([place]1/25/2022   10/06/20 A1C 7%   Dizziness 10/11/2019   Encounter for imaging study to confirm orogastric (OG) tube placement    Hematuria 10/26/2020   HFrEF (heart failure with reduced ejection fraction) ([place]  a. 11/2020 Echo: EF 25-30%; b. 02/2021 Echo: EF 25-30%, glob HK. Mild LVH. Nl RV fxn. Triv AI. Ao root 28m10m History of pulmonary embolus (PE)    HLD (hyperlipidemia) 10/26/2020   Hypertension    Iron deficiency  anemia rec'd IV iron 10/26/2020   Ischemic cardiomyopathy    a. 11/2020 Echo: EF 25-30%; b. 02/2021 Echo: EF 25-30%, glob HK.   Left bundle branch block 12/19/2017   Meningioma (HCC)[place]/11/2019   Nonsustained ventricular tachycardia (HCC)[place]/29/2021   Pain of left hip joint 05/29/2020   Personal history of pulmonary embolism 12/19/2017   Pneumonia of both lungs due to infectious organism    Pulmonary hypertension due to thromboembolism (HCC)[place]/28/2019   See echo 12/27/17 with nl PAS vs CTa chest 02/23/18    S/P angioplasty with stent 10/04/20 DES to LCX  10/26/2020   Sinus bradycardia 12/22/2017   SOB (shortness of breath)    Solitary pulmonary nodule on lung CT 03/08/2018   CT 12/04/17 1.0 x 0.8 x 0.7 cm nodular opacity in the RUL vs not seen 03/16/10 posterior segment of the right upper lobe. .Spi[place] Kitchenometry 03/08/2018    FEV1 3.86 (108%)  Ratio 96 s prior rx  - PET  03/13/18   Low grade c/w adenoca > rec T surgery eval     STEMI (ST elevation myocardial infarction) (HCC)[place]/23/2022   Urinary retention 10/26/2020   Vestibular schwannoma (HCC)[place]/11/2019   Past Surgical History:  Procedure Laterality Date   APPENDECTOMY     BIV ICD INSERTION CRT-D N/A 11/03/2021  Procedure: BIV ICD INSERTION CRT-D;  Surgeon: Constance Haw, MD;  Location: [facility];  Service: Cardiovascular;  Laterality: N/A;   CARDIAC CATHETERIZATION     CORONARY ANGIOPLASTY WITH STENT PLACEMENT Left 04/12/2021   LAD stent placement   CORONARY STENT INTERVENTION N/A 04/12/2021   Procedure: CORONARY STENT INTERVENTION;  Surgeon: Nelva Bush, MD;  Location: [facility];  Service: Cardiovascular;  Laterality: N/A;   CORONARY/GRAFT ACUTE MI REVASCULARIZATION N/A 10/04/2020   Procedure: Coronary/Graft Acute MI Revascularization;  Surgeon: Nelva Bush, MD;  Location: [facility];  Service: Cardiovascular;  Laterality: N/A;   HERNIA REPAIR     INTRAVASCULAR PRESSURE WIRE/FFR STUDY N/A 04/12/2021    Procedure: INTRAVASCULAR PRESSURE WIRE/FFR STUDY;  Surgeon: Nelva Bush, MD;  Location: [facility];  Service: Cardiovascular;  Laterality: N/A;   INTRAVASCULAR ULTRASOUND/IVUS N/A 04/12/2021   Procedure: Intravascular Ultrasound/IVUS;  Surgeon: Nelva Bush, MD;  Location: [facility];  Service: Cardiovascular;  Laterality: N/A;   LEFT HEART CATH AND CORONARY ANGIOGRAPHY N/A 10/04/2020   Procedure: LEFT HEART CATH AND CORONARY ANGIOGRAPHY;  Surgeon: Nelva Bush, MD;  Location: [facility];  Service: Cardiovascular;  Laterality: N/A;   RIGHT/LEFT HEART CATH AND CORONARY ANGIOGRAPHY N/A 04/12/2021   Procedure: RIGHT/LEFT HEART CATH AND CORONARY ANGIOGRAPHY;  Surgeon: Nelva Bush, MD;  Location: [facility];  Service: Cardiovascular;  Laterality: N/A;   Patient Active Problem List   Diagnosis Date Noted   Anemia    Leukocytosis    Right arm weakness 01/25/2022   Right sided weakness    Fall at home 01/22/2022   Status post biventricular pacemaker - MRI compatible PPM and leads 01/22/2022   Chronic indwelling Foley catheter 01/22/2022   CKD (chronic kidney disease), stage III - IV ([place]) 06/28/2021   Chronic HFrEF (heart failure with reduced ejection fraction) ([place]) 03/31/2021   Arrhythmia    DM (diabetes mellitus), type 2 ([place]) new 10/26/2020   Urinary retention 10/26/2020   Hyperlipidemia LDL goal <70 10/26/2020   Coronary artery disease of native artery of native heart with stable angina pectoris ([place]) 10/26/2020   S/P angioplasty with stent 10/04/20 DES to LCX  10/26/2020   Ischemic cardiomyopathy 10/26/2020   Diabetes mellitus, type 2 ([place]) 10/06/2020   DOE (dyspnea on exertion)    Meningioma ([place]) 08/14/2020   Vestibular schwannoma ([place]) 08/14/2020   Cardiac arrest with ventricular fibrillation (HCC)    Pain of left hip joint 05/29/2020   Benign brain tumor ([place])    Chronic kidney disease    History of pulmonary embolus (PE)     Hypertension    Nonsustained ventricular tachycardia ([place]) 10/11/2019   Dizziness 10/11/2019   Abnormal echocardiogram 06/07/2019   Pulmonary hypertension due to thromboembolism ([place]) 03/09/2018   Solitary pulmonary nodule on lung CT 03/08/2018   Ascending aortic aneurysm (HCC) 4.2 cm based on CT from December 2020 01/29/2018   Sinus bradycardia 12/22/2017   Left bundle branch block 12/19/2017   Personal history of pulmonary embolism - no longer on anticoagulation due to pt refusal to take it anymore. 12/19/2017   Chronic kidney disease, stage III (moderate) ([place]) 12/19/2017   Benign neoplasm of brain ([place]) 12/19/2017    PCP: Cari Caraway, MD   REFERRING PROVIDER: Cari Caraway, MD   REFERRING DIAG: Falls, unsteady gait, mm weakness Rationale for Evaluation and Treatment Rehabilitation  THERAPY DIAG:  Unsteadiness on feet  Right sided weakness  Other lack of coordination  Muscle weakness (generalized)  Repeated falls  ONSET DATE: 05/10/2022   SUBJECTIVE:                                                                                                                                                                                           SUBJECTIVE STATEMENT: Patient has had multiple falls in the past year.  He reports two recently.  HE has had meningioma's and surgery in the past and he still has 3 but has not grown, he does report double vision and is wearing prisms to correct that.  Reports that in the dark or with eyes closed he has no balance.  He does report that he uses night lights but the low light still really affects him.  Two recent falls one in the dark and one using a step stool to put up some lights.  He reports legs are very shaky and he feel weak PERTINENT HISTORY:  Brief HPI:   Travers Goodley is a 77 y.o. male who fell backward striking his head and right neck shoulder area on 01/21/2022. He complained of right hemiparesis. CT head revealed left  parafalcine meningioma.Started on Decadron with improvement in symptoms   PAIN:  Are you having pain? no PRECAUTIONS: ICD/Pacemaker  WEIGHT BEARING RESTRICTIONS No  FALLS:  Has patient fallen in last 6 months? Yes. Number of falls 2  LIVING ENVIRONMENT: Lives with: lives with their spouse Lives in: House/apartment Stairs: Yes: External: 7 steps; bilateral but cannot reach both Has following equipment at home: Walker - 2 wheeled and a cane, shower seat  OCCUPATION: Retired  PLOF: Independent  PATIENT GOALS Feel normal with standing and functioning.  Feel stronger and have better balance   OBJECTIVE:   DIAGNOSTIC FINDINGS:  CT Lumbar- 3. Moderate degenerative spinal stenosis at L2-3. CT CERVICAL IMPRESSION: 1. No evidence of intracranial injury. 2. Negative for cervical spine fracture but there is prevertebral edema at C4-5 suggesting soft tissue injury. 3. Meningiomas with left cerebral of edema, reference brain MRI 01/22/2022.  SCREENING FOR RED FLAGS: Bowel or bladder incontinence: No Spinal tumors: No Cauda equina syndrome: No Compression fracture: No Abdominal aneurysm: Yes: Abdominal aortic aneurysm, stable  COGNITION:  Overall cognitive status: WFL     SENSATION: WFL Patient reports tingling in B hands, all over.  MUSCLE LENGTH: Hamstrings: approximately 55 degrees B.  Was 70 degrees in September 2023  POSTURE: rounded shoulders and forward head  PALPATION: No TTP in R shoulder or up traps. B up traps tight, low back TTP R and L proximal to Illiac crest  LUMBAR ROM:  AROM limited in all directions, but he  lost his balance frequently, so accuracy of testing limited.   LOWER EXTREMITY ROM:      General mild stiffness throughout BLE with PROM.  LOWER EXTREMITY MMT:   MMT Right eval Left eval  Hip flexion 4- 4-  Hip extension    Hip abduction 4 4  Hip adduction    Hip internal rotation    Hip external rotation    Knee flexion 4+ 4-  Knee  extension 4+ 4-  Ankle dorsiflexion 4 4  Ankle plantarflexion    Ankle inversion    Ankle eversion     (Blank rows = not tested)  LUMBAR SPECIAL TESTS:  Slump test: Positive R  Shoulder Special Tests empty can test (+) R, Hawkins-Kennedy test (+) R. Previous MRI reveals full tear of supraspinatus and partial tear of infraspinatus.  FUNCTIONAL TESTS:  5 times sit to stand: 30 seconds struggled with weak knees Timed up and go (TUG): 27 seconds with SPC Functional gait assessment: not tested BERG balance test:  23/56  GAIT: Distance walked: 80 Assistive device utilized: SPC, very unsteady used walls to stabilize, knees very shaky and unsteady, needing CGA to min A Level of assistance: CGA to min A for LOB Comments: Patient was unsteady with turns, relied on UE support for balance., legs shaky, presents like he has neuropathy but he does not, also reports double vision    TODAY'S TREATMENT     PATIENT EDUCATION:  Education details: POC,  Person educated: Patient and Spouse Education method: Explanation Education comprehension: verbalized understanding   HOME EXERCISE PROGRAM:  ASSESSMENT:  CLINICAL IMPRESSION: Patient is a 77 y.o. who was seen today for physical therapy evaluation and treatment for balance issues and repeated falls.  He has had two recent falls, has had meningioma gamma knife surgery in the past but reports still 3 meningioma's.  He present's as he has neuropathy but he does not, very unsteady, Berg was 23/56 and really struggled on the balance, he was weaker as well than in the middle of last year   OBJECTIVE IMPAIRMENTS Abnormal gait, decreased activity tolerance, decreased balance, decreased cognition, decreased coordination, difficulty walking, decreased ROM, decreased strength, increased muscle spasms, impaired flexibility, postural dysfunction, and pain.   ACTIVITY LIMITATIONS carrying, lifting, bending, standing, squatting, stairs, reach over head,  and locomotion level  PARTICIPATION LIMITATIONS: cleaning, laundry, driving, shopping, and community activity  PERSONAL FACTORS Age and 1 comorbidity: multiple meningiomas  are also affecting patient's functional outcome.   REHAB POTENTIAL: Good  CLINICAL DECISION MAKING: Evolving/moderate complexity  EVALUATION COMPLEXITY: Moderate   GOALS: Goals reviewed with patient? Yes  SHORT TERM GOALS: Target date: 10/04/22  I with initial HEP Baseline: Goal status: INITIAL  LONG TERM GOALS: Target date: 12/20/22  I with final HEP Baseline:  Goal status: INITIAL  2.  Patient will have no falls over a 6 week period Baseline: 2 falls  Goal status: INITIAL  3.  Complete 5 x STS in <12 sec to demonstrate improved LE strength Baseline: 30 seconds Goal status: INITIAL  4.  Patient will complete TUG in < 12 sec with or without AD Baseline: 27 seconds Goal status: INITIAL  5.  Patient will score at least 43/56 on Berg balance Baseline: 23/56 Goal status: INITIAL  6.  Patient will walk at least 600' with LRAD, MI on level an unlevel surfaces, back pain < 3/10 Baseline: 80', unsteady, SPC with CGA/Min A Goal status: INITIAL   PLAN: PT FREQUENCY: 2x/week  PT DURATION: 12 weeks  PLANNED INTERVENTIONS: Therapeutic exercises, Therapeutic activity, Neuromuscular re-education, Balance training, Gait training, Patient/Family education, Self Care, Joint mobilization, Stair training, Dry Needling, Electrical stimulation, Cryotherapy, Moist heat, Ultrasound, Ionotophoresis '4mg'$ /ml Dexamethasone, and Manual therapy.  PLAN FOR NEXT SESSION: work on balance and strength, very fatigued with just the testing  Lum Babe, PT 09/20/2022, 2:03 PM

## 2022-09-21 DIAGNOSIS — L600 Ingrowing nail: Secondary | ICD-10-CM | POA: Diagnosis not present

## 2022-09-21 DIAGNOSIS — M79674 Pain in right toe(s): Secondary | ICD-10-CM | POA: Diagnosis not present

## 2022-09-21 DIAGNOSIS — B351 Tinea unguium: Secondary | ICD-10-CM | POA: Diagnosis not present

## 2022-09-21 DIAGNOSIS — M79675 Pain in left toe(s): Secondary | ICD-10-CM | POA: Diagnosis not present

## 2022-09-22 ENCOUNTER — Ambulatory Visit: Payer: Medicare Other | Admitting: Physical Therapy

## 2022-09-22 ENCOUNTER — Encounter: Payer: Self-pay | Admitting: Physical Therapy

## 2022-09-22 ENCOUNTER — Ambulatory Visit: Payer: Medicare Other | Admitting: Cardiology

## 2022-09-22 DIAGNOSIS — R296 Repeated falls: Secondary | ICD-10-CM

## 2022-09-22 DIAGNOSIS — R278 Other lack of coordination: Secondary | ICD-10-CM | POA: Diagnosis not present

## 2022-09-22 DIAGNOSIS — M6281 Muscle weakness (generalized): Secondary | ICD-10-CM

## 2022-09-22 DIAGNOSIS — R531 Weakness: Secondary | ICD-10-CM

## 2022-09-22 DIAGNOSIS — D329 Benign neoplasm of meninges, unspecified: Secondary | ICD-10-CM | POA: Diagnosis not present

## 2022-09-22 DIAGNOSIS — R2681 Unsteadiness on feet: Secondary | ICD-10-CM

## 2022-09-22 NOTE — Therapy (Signed)
OUTPATIENT PHYSICAL THERAPY THORACOLUMBAR EVALUATION   Patient Name: Timothy Moran MRN: [id number] DOB:04-07-1946, 77 y.o., male Today's Date: 09/22/2022   PT End of Session - 09/22/22 1535     Visit Number 2    Date for PT Re-Evaluation 12/20/22    PT Start Time 1531    PT Stop Time 1610    PT Time Calculation (min) 39 min    Equipment Utilized During Treatment Gait belt    Activity Tolerance Patient tolerated treatment well    Behavior During Therapy [facility] for tasks assessed/performed              Past Medical History:  Diagnosis Date   Acute blood loss anemia 10/05/2020   Acute respiratory failure ([place]), hypothermia therapy, vent - extubated 10/06/20    AKI (acute kidney injury) ([place]) 10/05/2020   Anoxic brain injury ([place]) 10/26/2020   Arrhythmia    Ascending aortic aneurysm ([place]) 01/29/2018   a. 2019 4.3 cm by echo; b. 02/2021 Echo: Ao root 48m.   Benign brain tumor ([place]    Benign neoplasm of brain ([place] 12/19/2017   Cardiac arrest with ventricular fibrillation ([place]    Chest pain 12/04/2017   Chronic kidney disease, stage III (moderate) ([place] 12/19/2017   CKD (chronic kidney disease), stage III - IV (HCC)    Coronary Artery Disease    a. 10/1601Inf STEMI complicated by cardiac arrest/PCI: LAD 50p/m, LCX 100d (2.5 x 30 Resolute Onyx DES); b. 04/2021 PCI: 04/2021 LM nl, LAD 50p/83m2.5x30 Onyx Frontier DES), LCX 25p, 5041mFR 0.98), 40d ISR (RFR 0.98), OM2 25, RCA mild diff dzs.   Diabetes mellitus, type 2 (HCCArispe1/25/2022   10/06/20 A1C 7%   Dizziness 10/11/2019   Encounter for imaging study to confirm orogastric (OG) tube placement    Hematuria 10/26/2020   HFrEF (heart failure with reduced ejection fraction) ([place]  a. 11/2020 Echo: EF 25-30%; b. 02/2021 Echo: EF 25-30%, glob HK. Mild LVH. Nl RV fxn. Triv AI. Ao root 67m85m History of pulmonary embolus (PE)    HLD (hyperlipidemia) 10/26/2020   Hypertension    Iron deficiency anemia rec'd IV iron 10/26/2020    Ischemic cardiomyopathy    a. 11/2020 Echo: EF 25-30%; b. 02/2021 Echo: EF 25-30%, glob HK.   Left bundle branch block 12/19/2017   Meningioma (HCC)[place]/11/2019   Nonsustained ventricular tachycardia (HCC)[place]/29/2021   Pain of left hip joint 05/29/2020   Personal history of pulmonary embolism 12/19/2017   Pneumonia of both lungs due to infectious organism    Pulmonary hypertension due to thromboembolism (HCC)[place]/28/2019   See echo 12/27/17 with nl PAS vs CTa chest 02/23/18    S/P angioplasty with stent 10/04/20 DES to LCX  10/26/2020   Sinus bradycardia 12/22/2017   SOB (shortness of breath)    Solitary pulmonary nodule on lung CT 03/08/2018   CT 12/04/17 1.0 x 0.8 x 0.7 cm nodular opacity in the RUL vs not seen 03/16/10 posterior segment of the right upper lobe. .SpiMarland Kitchenometry 03/08/2018    FEV1 3.86 (108%)  Ratio 96 s prior rx  - PET  03/13/18   Low grade c/w adenoca > rec T surgery eval     STEMI (ST elevation myocardial infarction) (HCC)[place]/23/2022   Urinary retention 10/26/2020   Vestibular schwannoma (HCC)[place]/11/2019   Past Surgical History:  Procedure Laterality Date   APPENDECTOMY     BIV ICD INSERTION CRT-D N/A 11/03/2021   Procedure: BIV ICD INSERTION  CRT-D;  Surgeon: Constance Haw, MD;  Location: [place] [facility];  Service: Cardiovascular;  Laterality: N/A;   CARDIAC CATHETERIZATION     CORONARY ANGIOPLASTY WITH STENT PLACEMENT Left 04/12/2021   LAD stent placement   CORONARY STENT INTERVENTION N/A 04/12/2021   Procedure: CORONARY STENT INTERVENTION;  Surgeon: Nelva Bush, MD;  Location: [place] [facility];  Service: Cardiovascular;  Laterality: N/A;   CORONARY/GRAFT ACUTE MI REVASCULARIZATION N/A 10/04/2020   Procedure: Coronary/Graft Acute MI Revascularization;  Surgeon: Nelva Bush, MD;  Location: [place] [facility];  Service: Cardiovascular;  Laterality: N/A;   HERNIA REPAIR     INTRAVASCULAR PRESSURE WIRE/FFR STUDY N/A 04/12/2021   Procedure: INTRAVASCULAR PRESSURE  WIRE/FFR STUDY;  Surgeon: Nelva Bush, MD;  Location: [place] [facility];  Service: Cardiovascular;  Laterality: N/A;   INTRAVASCULAR ULTRASOUND/IVUS N/A 04/12/2021   Procedure: Intravascular Ultrasound/IVUS;  Surgeon: Nelva Bush, MD;  Location: [place] [facility];  Service: Cardiovascular;  Laterality: N/A;   LEFT HEART CATH AND CORONARY ANGIOGRAPHY N/A 10/04/2020   Procedure: LEFT HEART CATH AND CORONARY ANGIOGRAPHY;  Surgeon: Nelva Bush, MD;  Location: [place] [facility];  Service: Cardiovascular;  Laterality: N/A;   RIGHT/LEFT HEART CATH AND CORONARY ANGIOGRAPHY N/A 04/12/2021   Procedure: RIGHT/LEFT HEART CATH AND CORONARY ANGIOGRAPHY;  Surgeon: Nelva Bush, MD;  Location: [place] [facility];  Service: Cardiovascular;  Laterality: N/A;   Patient Active Problem List   Diagnosis Date Noted   Anemia    Leukocytosis    Right arm weakness 01/25/2022   Right sided weakness    Fall at home 01/22/2022   Status post biventricular pacemaker - MRI compatible PPM and leads 01/22/2022   Chronic indwelling Foley catheter 01/22/2022   CKD (chronic kidney disease), stage III - IV ([place]) 06/28/2021   Chronic HFrEF (heart failure with reduced ejection fraction) ([place]) 03/31/2021   Arrhythmia    DM (diabetes mellitus), type 2 ([place]) new 10/26/2020   Urinary retention 10/26/2020   Hyperlipidemia LDL goal <70 10/26/2020   Coronary artery disease of native artery of native heart with stable angina pectoris ([place]) 10/26/2020   S/P angioplasty with stent 10/04/20 DES to LCX  10/26/2020   Ischemic cardiomyopathy 10/26/2020   Diabetes mellitus, type 2 ([place]) 10/06/2020   DOE (dyspnea on exertion)    Meningioma ([place]) 08/14/2020   Vestibular schwannoma ([place]) 08/14/2020   Cardiac arrest with ventricular fibrillation (HCC)    Pain of left hip joint 05/29/2020   Benign brain tumor ([place])    Chronic kidney disease    History of pulmonary embolus (PE)    Hypertension    Nonsustained ventricular  tachycardia ([place]) 10/11/2019   Dizziness 10/11/2019   Abnormal echocardiogram 06/07/2019   Pulmonary hypertension due to thromboembolism ([place]) 03/09/2018   Solitary pulmonary nodule on lung CT 03/08/2018   Ascending aortic aneurysm (HCC) 4.2 cm based on CT from December 2020 01/29/2018   Sinus bradycardia 12/22/2017   Left bundle branch block 12/19/2017   Personal history of pulmonary embolism - no longer on anticoagulation due to pt refusal to take it anymore. 12/19/2017   Chronic kidney disease, stage III (moderate) ([place]) 12/19/2017   Benign neoplasm of brain ([place]) 12/19/2017    PCP: Timothy Caraway, MD   REFERRING PROVIDER: Cari Caraway, MD   REFERRING DIAG: Falls, unsteady gait, mm weakness Rationale for Evaluation and Treatment Rehabilitation  THERAPY DIAG:  Unsteadiness on feet  Right sided weakness  Other lack of coordination  Meningioma ([place])  Repeated  falls  Muscle weakness (generalized)  ONSET DATE: 05/10/2022   SUBJECTIVE:                                                                                                                                                                                           SUBJECTIVE STATEMENT: Patient has had multiple falls in the past year.  He reports two recently.  HE has had meningioma's and surgery in the past and he still has 3 but has not grown, he does report double vision and is wearing prisms to correct that.  Reports that in the dark or with eyes closed he has no balance.  He does report that he uses night lights but the low light still really affects him.  Two recent falls one in the dark and one using a step stool to put up some lights.  He reports legs are very shaky and he feel weak PERTINENT HISTORY:  Brief HPI:   Brailen Macneal is a 77 y.o. male who fell backward striking his head and right neck shoulder area on 01/21/2022. He complained of right hemiparesis. CT head revealed left parafalcine meningioma.Started  on Decadron with improvement in symptoms   PAIN:  Are you having pain? no PRECAUTIONS: ICD/Pacemaker  WEIGHT BEARING RESTRICTIONS No  FALLS:  Has patient fallen in last 6 months? Yes. Number of falls 2  LIVING ENVIRONMENT: Lives with: lives with their spouse Lives in: House/apartment Stairs: Yes: External: 7 steps; bilateral but cannot reach both Has following equipment at home: Walker - 2 wheeled and a cane, shower seat  OCCUPATION: Retired  PLOF: Independent  PATIENT GOALS Feel normal with standing and functioning.  Feel stronger and have better balance   OBJECTIVE:   DIAGNOSTIC FINDINGS:  CT Lumbar- 3. Moderate degenerative spinal stenosis at L2-3. CT CERVICAL IMPRESSION: 1. No evidence of intracranial injury. 2. Negative for cervical spine fracture but there is prevertebral edema at C4-5 suggesting soft tissue injury. 3. Meningiomas with left cerebral of edema, reference brain MRI 01/22/2022.  SCREENING FOR RED FLAGS: Bowel or bladder incontinence: No Spinal tumors: No Cauda equina syndrome: No Compression fracture: No Abdominal aneurysm: Yes: Abdominal aortic aneurysm, stable  COGNITION:  Overall cognitive status: WFL     SENSATION: WFL Patient reports tingling in B hands, all over.  MUSCLE LENGTH: Hamstrings: approximately 55 degrees B.  Was 70 degrees in September 2023  POSTURE: rounded shoulders and forward head  PALPATION: No TTP in R shoulder or up traps. B up traps tight, low back TTP R and L proximal to Illiac crest  LUMBAR ROM:  AROM limited in all directions, but he lost  his balance frequently, so accuracy of testing limited.   LOWER EXTREMITY ROM:      General mild stiffness throughout BLE with PROM.  LOWER EXTREMITY MMT:   MMT Right eval Left eval  Hip flexion 4- 4-  Hip extension    Hip abduction 4 4  Hip adduction    Hip internal rotation    Hip external rotation    Knee flexion 4+ 4-  Knee extension 4+ 4-  Ankle  dorsiflexion 4 4  Ankle plantarflexion    Ankle inversion    Ankle eversion     (Blank rows = not tested)  LUMBAR SPECIAL TESTS:  Slump test: Positive R  Shoulder Special Tests empty can test (+) R, Hawkins-Kennedy test (+) R. Previous MRI reveals full tear of supraspinatus and partial tear of infraspinatus.  FUNCTIONAL TESTS:  5 times sit to stand: 30 seconds struggled with weak knees Timed up and go (TUG): 27 seconds with SPC Functional gait assessment: not tested BERG balance test:  23/56  GAIT: Distance walked: 80 Assistive device utilized: SPC, very unsteady used walls to stabilize, knees very shaky and unsteady, needing CGA to min A Level of assistance: CGA to min A for LOB Comments: Patient was unsteady with turns, relied on UE support for balance., legs shaky, presents like he has neuropathy but he does not, also reports double vision    TODAY'S TREATMENT  09/22/22 Bike L4 x 6 minutes Supine SLR x 10 with each leg Bridge with abd resistance at knees with g tband, 10 reps Alternating march with pelvic tilt, 10 reps each. Attempted sit to stand from mat, but too challenging. Mini squats with BUE support for balance, x 10 Standing on Air ex pad, forward and back weight shifts, occasional min A for balance Alternating step taps on 4" step, then placed one foot on step. Standing rotation, no UE support Occasional unsteadiness, but no LOB noted. Quick side to side steps x 60 sec. Therapist providing consistent VC to keep the speed up  Mildly unsteady, but no LOB.   PATIENT EDUCATION:  Education details: POC,  Person educated: Patient and Spouse Education method: Explanation Education comprehension: verbalized understanding   HOME EXERCISE PROGRAM: [id number]   ASSESSMENT:  CLINICAL IMPRESSION: Patient arrives without an AD, unsteady upon standing. He feels he needs to work on balance. Reviewed previous HEP to determine whether it is still appropriate. It does  appear to continue to address his needs, updated to include additional balance activities. **Forgot to give to patient** Performed additional balance training for SLS and quick balance reactions. Unsteady and SOB, but no true LOB.   OBJECTIVE IMPAIRMENTS Abnormal gait, decreased activity tolerance, decreased balance, decreased cognition, decreased coordination, difficulty walking, decreased ROM, decreased strength, increased muscle spasms, impaired flexibility, postural dysfunction, and pain.   ACTIVITY LIMITATIONS carrying, lifting, bending, standing, squatting, stairs, reach over head, and locomotion level  PARTICIPATION LIMITATIONS: cleaning, laundry, driving, shopping, and community activity  PERSONAL FACTORS Age and 1 comorbidity: multiple meningiomas  are also affecting patient's functional outcome.   REHAB POTENTIAL: Good  CLINICAL DECISION MAKING: Evolving/moderate complexity  EVALUATION COMPLEXITY: Moderate   GOALS: Goals reviewed with patient? Yes  SHORT TERM GOALS: Target date: 10/04/22  I with initial HEP Baseline: Goal status: ongoing  LONG TERM GOALS: Target date: 12/20/22  I with final HEP Baseline:  Goal status: INITIAL  2.  Patient will have no falls over a 6 week period Baseline: 2 falls  Goal status: ongoing  3.  Complete 5 x STS in <12 sec to demonstrate improved LE strength Baseline: 30 seconds Goal status: INITIAL  4.  Patient will complete TUG in < 12 sec with or without AD Baseline: 27 seconds Goal status: INITIAL  5.  Patient will score at least 43/56 on Berg balance Baseline: 23/56 Goal status: INITIAL  6.  Patient will walk at least 600' with LRAD, MI on level an unlevel surfaces, back pain < 3/10 Baseline: 80', unsteady, SPC with CGA/Min A Goal status: INITIAL   PLAN: PT FREQUENCY: 2x/week  PT DURATION: 12 weeks  PLANNED INTERVENTIONS: Therapeutic exercises, Therapeutic activity, Neuromuscular re-education, Balance training, Gait  training, Patient/Family education, Self Care, Joint mobilization, Stair training, Dry Needling, Electrical stimulation, Cryotherapy, Moist heat, Ultrasound, Ionotophoresis '4mg'$ /ml Dexamethasone, and Manual therapy.  PLAN FOR NEXT SESSION: work on balance and strength  Ethel Rana DPT 09/22/22 4:13 PM

## 2022-09-27 ENCOUNTER — Encounter: Payer: Self-pay | Admitting: Physical Therapy

## 2022-09-27 ENCOUNTER — Ambulatory Visit: Payer: Medicare Other | Admitting: Physical Therapy

## 2022-09-27 ENCOUNTER — Telehealth: Payer: Self-pay

## 2022-09-27 DIAGNOSIS — D329 Benign neoplasm of meninges, unspecified: Secondary | ICD-10-CM | POA: Diagnosis not present

## 2022-09-27 DIAGNOSIS — R278 Other lack of coordination: Secondary | ICD-10-CM | POA: Diagnosis not present

## 2022-09-27 DIAGNOSIS — R296 Repeated falls: Secondary | ICD-10-CM | POA: Diagnosis not present

## 2022-09-27 DIAGNOSIS — M6281 Muscle weakness (generalized): Secondary | ICD-10-CM | POA: Diagnosis not present

## 2022-09-27 DIAGNOSIS — R2681 Unsteadiness on feet: Secondary | ICD-10-CM | POA: Diagnosis not present

## 2022-09-27 DIAGNOSIS — R29898 Other symptoms and signs involving the musculoskeletal system: Secondary | ICD-10-CM

## 2022-09-27 DIAGNOSIS — R531 Weakness: Secondary | ICD-10-CM

## 2022-09-27 NOTE — Therapy (Signed)
OUTPATIENT PHYSICAL THERAPY THORACOLUMBAR EVALUATION   Patient Name: Chalmer Zheng MRN: [id number] DOB:[birth year], 77 y.o., male Today's Date: 09/27/2022   PT End of Session - 09/27/22 1537     Visit Number 3    Date for PT Re-Evaluation 12/20/22    Authorization Type Medicare    PT Start Time 1530    PT Stop Time 1615    PT Time Calculation (min) 45 min    Equipment Utilized During Treatment Gait belt    Activity Tolerance Patient tolerated treatment well    Behavior During Therapy [facility] for tasks assessed/performed              Past Medical History:  Diagnosis Date   Acute blood loss anemia 10/05/2020   Acute respiratory failure ([place]), hypothermia therapy, vent - extubated 10/06/20    AKI (acute kidney injury) ([place]) 10/05/2020   Anoxic brain injury ([place]) 10/26/2020   Arrhythmia    Ascending aortic aneurysm ([place]) 01/29/2018   a. 2019 4.3 cm by echo; b. 02/2021 Echo: Ao root 13m.   Benign brain tumor ([place]    Benign neoplasm of brain ([place] 12/19/2017   Cardiac arrest with ventricular fibrillation ([place]    Chest pain 12/04/2017   Chronic kidney disease, stage III (moderate) ([place] 12/19/2017   CKD (chronic kidney disease), stage III - IV (HCC)    Coronary Artery Disease    a. 11/9379Inf STEMI complicated by cardiac arrest/PCI: LAD 50p/m, LCX 100d (2.5 x 30 Resolute Onyx DES); b. 04/2021 PCI: 04/2021 LM nl, LAD 50p/864m2.5x30 Onyx Frontier DES), LCX 25p, 509mFR 0.98), 40d ISR (RFR 0.98), OM2 25, RCA mild diff dzs.   Diabetes mellitus, type 2 ([place]1/25/2022   10/06/20 A1C 7%   Dizziness 10/11/2019   Encounter for imaging study to confirm orogastric (OG) tube placement    Hematuria 10/26/2020   HFrEF (heart failure with reduced ejection fraction) ([place]  a. 11/2020 Echo: EF 25-30%; b. 02/2021 Echo: EF 25-30%, glob HK. Mild LVH. Nl RV fxn. Triv AI. Ao root 18m63m History of pulmonary embolus (PE)    HLD (hyperlipidemia) 10/26/2020   Hypertension    Iron deficiency  anemia rec'd IV iron 10/26/2020   Ischemic cardiomyopathy    a. 11/2020 Echo: EF 25-30%; b. 02/2021 Echo: EF 25-30%, glob HK.   Left bundle branch block 12/19/2017   Meningioma (HCC)[place]/11/2019   Nonsustained ventricular tachycardia (HCC)[place]/29/2021   Pain of left hip joint 05/29/2020   Personal history of pulmonary embolism 12/19/2017   Pneumonia of both lungs due to infectious organism    Pulmonary hypertension due to thromboembolism (HCC)[place]/28/2019   See echo 12/27/17 with nl PAS vs CTa chest 02/23/18    S/P angioplasty with stent 10/04/20 DES to LCX  10/26/2020   Sinus bradycardia 12/22/2017   SOB (shortness of breath)    Solitary pulmonary nodule on lung CT 03/08/2018   CT 12/04/17 1.0 x 0.8 x 0.7 cm nodular opacity in the RUL vs not seen 03/16/10 posterior segment of the right upper lobe. .[place]pi[place] Kitchenometry 03/08/2018    FEV1 3.86 (108%)  Ratio 96 s prior rx  - PET  03/13/18   Low grade c/w adenoca > rec T surgery eval     STEMI (ST elevation myocardial infarction) (HCC)[place]/23/2022   Urinary retention 10/26/2020   Vestibular schwannoma (HCC)[place]/11/2019   Past Surgical History:  Procedure Laterality Date   APPENDECTOMY     BIV ICD INSERTION CRT-D N/A 11/03/2021  Procedure: BIV ICD INSERTION CRT-D;  Surgeon: Constance Haw, MD;  Location: [place] [facility];  Service: Cardiovascular;  Laterality: N/A;   CARDIAC CATHETERIZATION     CORONARY ANGIOPLASTY WITH STENT PLACEMENT Left 04/12/2021   LAD stent placement   CORONARY STENT INTERVENTION N/A 04/12/2021   Procedure: CORONARY STENT INTERVENTION;  Surgeon: Nelva Bush, MD;  Location: [place] [facility];  Service: Cardiovascular;  Laterality: N/A;   CORONARY/GRAFT ACUTE MI REVASCULARIZATION N/A 10/04/2020   Procedure: Coronary/Graft Acute MI Revascularization;  Surgeon: Nelva Bush, MD;  Location: [place] [facility];  Service: Cardiovascular;  Laterality: N/A;   HERNIA REPAIR     INTRAVASCULAR PRESSURE WIRE/FFR STUDY N/A 04/12/2021    Procedure: INTRAVASCULAR PRESSURE WIRE/FFR STUDY;  Surgeon: Nelva Bush, MD;  Location: [place] [facility];  Service: Cardiovascular;  Laterality: N/A;   INTRAVASCULAR ULTRASOUND/IVUS N/A 04/12/2021   Procedure: Intravascular Ultrasound/IVUS;  Surgeon: Nelva Bush, MD;  Location: [place] [facility];  Service: Cardiovascular;  Laterality: N/A;   LEFT HEART CATH AND CORONARY ANGIOGRAPHY N/A 10/04/2020   Procedure: LEFT HEART CATH AND CORONARY ANGIOGRAPHY;  Surgeon: Nelva Bush, MD;  Location: [place] [facility];  Service: Cardiovascular;  Laterality: N/A;   RIGHT/LEFT HEART CATH AND CORONARY ANGIOGRAPHY N/A 04/12/2021   Procedure: RIGHT/LEFT HEART CATH AND CORONARY ANGIOGRAPHY;  Surgeon: Nelva Bush, MD;  Location: [place] [facility];  Service: Cardiovascular;  Laterality: N/A;   Patient Active Problem List   Diagnosis Date Noted   Anemia    Leukocytosis    Right arm weakness 01/25/2022   Right sided weakness    Fall at home 01/22/2022   Status post biventricular pacemaker - MRI compatible PPM and leads 01/22/2022   Chronic indwelling Foley catheter 01/22/2022   CKD (chronic kidney disease), stage III - IV ([place]) 06/28/2021   Chronic HFrEF (heart failure with reduced ejection fraction) ([place]) 03/31/2021   Arrhythmia    DM (diabetes mellitus), type 2 ([place]) new 10/26/2020   Urinary retention 10/26/2020   Hyperlipidemia LDL goal <70 10/26/2020   Coronary artery disease of native artery of native heart with stable angina pectoris ([place]) 10/26/2020   S/P angioplasty with stent 10/04/20 DES to LCX  10/26/2020   Ischemic cardiomyopathy 10/26/2020   Diabetes mellitus, type 2 ([place]) 10/06/2020   DOE (dyspnea on exertion)    Meningioma ([place]) 08/14/2020   Vestibular schwannoma ([place]) 08/14/2020   Cardiac arrest with ventricular fibrillation (HCC)    Pain of left hip joint 05/29/2020   Benign brain tumor ([place])    Chronic kidney disease    History of pulmonary embolus (PE)     Hypertension    Nonsustained ventricular tachycardia ([place]) 10/11/2019   Dizziness 10/11/2019   Abnormal echocardiogram 06/07/2019   Pulmonary hypertension due to thromboembolism ([place]) 03/09/2018   Solitary pulmonary nodule on lung CT 03/08/2018   Ascending aortic aneurysm (HCC) 4.2 cm based on CT from December 2020 01/29/2018   Sinus bradycardia 12/22/2017   Left bundle branch block 12/19/2017   Personal history of pulmonary embolism - no longer on anticoagulation due to pt refusal to take it anymore. 12/19/2017   Chronic kidney disease, stage III (moderate) ([place]) 12/19/2017   Benign neoplasm of brain ([place]) 12/19/2017    PCP: Cari Caraway, MD   REFERRING PROVIDER: Cari Caraway, MD   REFERRING DIAG: Falls, unsteady gait, mm weakness Rationale for Evaluation and Treatment Rehabilitation  THERAPY DIAG:  Unsteadiness on feet  Right sided weakness  Other lack of coordination  Meningioma ([place])  Repeated falls  Muscle weakness (generalized)  Poor body mechanics  ONSET DATE: 05/10/2022   SUBJECTIVE:                                                                                                                                                                                           SUBJECTIVE STATEMENT: Patient reports no falls, just weak and unsteady with gait  PERTINENT HISTORY:  Brief HPI:   Jye Fariss is a 77 y.o. male who fell backward striking his head and right neck shoulder area on 01/21/2022. He complained of right hemiparesis. CT head revealed left parafalcine meningioma.Started on Decadron with improvement in symptoms   PAIN:  Are you having pain? no PRECAUTIONS: ICD/Pacemaker  WEIGHT BEARING RESTRICTIONS No  FALLS:  Has patient fallen in last 6 months? Yes. Number of falls 2  LIVING ENVIRONMENT: Lives with: lives with their spouse Lives in: House/apartment Stairs: Yes: External: 7 steps; bilateral but cannot reach both Has following equipment  at home: Walker - 2 wheeled and a cane, shower seat  OCCUPATION: Retired  PLOF: Independent  PATIENT GOALS Feel normal with standing and functioning.  Feel stronger and have better balance   OBJECTIVE:   DIAGNOSTIC FINDINGS:  CT Lumbar- 3. Moderate degenerative spinal stenosis at L2-3. CT CERVICAL IMPRESSION: 1. No evidence of intracranial injury. 2. Negative for cervical spine fracture but there is prevertebral edema at C4-5 suggesting soft tissue injury. 3. Meningiomas with left cerebral of edema, reference brain MRI 01/22/2022.  SCREENING FOR RED FLAGS: Bowel or bladder incontinence: No Spinal tumors: No Cauda equina syndrome: No Compression fracture: No Abdominal aneurysm: Yes: Abdominal aortic aneurysm, stable  COGNITION:  Overall cognitive status: WFL     SENSATION: WFL Patient reports tingling in B hands, all over.  MUSCLE LENGTH: Hamstrings: approximately 55 degrees B.  Was 70 degrees in September 2023  POSTURE: rounded shoulders and forward head  PALPATION: No TTP in R shoulder or up traps. B up traps tight, low back TTP R and L proximal to Illiac crest  LUMBAR ROM:  AROM limited in all directions, but he lost his balance frequently, so accuracy of testing limited.   LOWER EXTREMITY ROM:      General mild stiffness throughout BLE with PROM.  LOWER EXTREMITY MMT:   MMT Right eval Left eval  Hip flexion 4- 4-  Hip extension    Hip abduction 4 4  Hip adduction    Hip internal rotation    Hip external rotation    Knee flexion 4+ 4-  Knee extension 4+ 4-  Ankle dorsiflexion 4 4  Ankle plantarflexion  Ankle inversion    Ankle eversion     (Blank rows = not tested)  LUMBAR SPECIAL TESTS:  Slump test: Positive R  Shoulder Special Tests empty can test (+) R, Hawkins-Kennedy test (+) R. Previous MRI reveals full tear of supraspinatus and partial tear of infraspinatus.  FUNCTIONAL TESTS:  5 times sit to stand: 30 seconds struggled with weak  knees Timed up and go (TUG): 27 seconds with SPC Functional gait assessment: not tested BERG balance test:  23/56  GAIT: Distance walked: 80 Assistive device utilized: SPC, very unsteady used walls to stabilize, knees very shaky and unsteady, needing CGA to min A Level of assistance: CGA to min A for LOB Comments: Patient was unsteady with turns, relied on UE support for balance., legs shaky, presents like he has neuropathy but he does not, also reports double vision    TODAY'S TREATMENT  09/29/22 Nustep level 5 x 6 mintues Leg curls 20# single legs each 2x10 Leg extenison single legs 5# each leg 2x10 In pbars, on airex balance beam side stepping and tandem walking Volleyball with back of knees touching the mat table and then without Back to wall but away from it doing butt touches and shoulder blade touches to the wall working on hip and ankle strategies Sit to stand with and without weighted ball Direction changes Leg press 20# 2x10 Walking ball toss  09/22/22 Bike L4 x 6 minutes Supine SLR x 10 with each leg Bridge with abd resistance at knees with g tband, 10 reps Alternating march with pelvic tilt, 10 reps each. Attempted sit to stand from mat, but too challenging. Mini squats with BUE support for balance, x 10 Standing on Air ex pad, forward and back weight shifts, occasional min A for balance Alternating step taps on 4" step, then placed one foot on step. Standing rotation, no UE support Occasional unsteadiness, but no LOB noted. Quick side to side steps x 60 sec. Therapist providing consistent VC to keep the speed up  Mildly unsteady, but no LOB.   PATIENT EDUCATION:  Education details: POC,  Person educated: Patient and Spouse Education method: Explanation Education comprehension: verbalized understanding   HOME EXERCISE PROGRAM: [place]   ASSESSMENT:  CLINICAL IMPRESSION: Patient with difficulty controlling the decent of the eccentrics on sitting.  We  worked on this today and really focused a lot on balance only 2 x needing Mod/Max A to correct  OBJECTIVE IMPAIRMENTS Abnormal gait, decreased activity tolerance, decreased balance, decreased cognition, decreased coordination, difficulty walking, decreased ROM, decreased strength, increased muscle spasms, impaired flexibility, postural dysfunction, and pain.   ACTIVITY LIMITATIONS carrying, lifting, bending, standing, squatting, stairs, reach over head, and locomotion level  PARTICIPATION LIMITATIONS: cleaning, laundry, driving, shopping, and community activity  PERSONAL FACTORS Age and 1 comorbidity: multiple meningiomas  are also affecting patient's functional outcome.   REHAB POTENTIAL: Good  CLINICAL DECISION MAKING: Evolving/moderate complexity  EVALUATION COMPLEXITY: Moderate   GOALS: Goals reviewed with patient? Yes  SHORT TERM GOALS: Target date: 10/04/22  I with initial HEP Baseline: Goal status: ongoing  LONG TERM GOALS: Target date: 12/20/22  I with final HEP Baseline:  Goal status: INITIAL  2.  Patient will have no falls over a 6 week period Baseline: 2 falls  Goal status: ongoing  3.  Complete 5 x STS in <12 sec to demonstrate improved LE strength Baseline: 30 seconds Goal status: INITIAL  4.  Patient will complete TUG in < 12 sec with or without AD Baseline: 27  seconds Goal status: INITIAL  5.  Patient will score at least 43/56 on Berg balance Baseline: 23/56 Goal status: INITIAL  6.  Patient will walk at least 600' with LRAD, MI on level an unlevel surfaces, back pain < 3/10 Baseline: 80', unsteady, SPC with CGA/Min A Goal status: INITIAL   PLAN: PT FREQUENCY: 2x/week  PT DURATION: 12 weeks  PLANNED INTERVENTIONS: Therapeutic exercises, Therapeutic activity, Neuromuscular re-education, Balance training, Gait training, Patient/Family education, Self Care, Joint mobilization, Stair training, Dry Needling, Electrical stimulation, Cryotherapy, Moist  heat, Ultrasound, Ionotophoresis '4mg'$ /ml Dexamethasone, and Manual therapy.  PLAN FOR NEXT SESSION: work on balance and Vevelyn Francois 09/27/22 3:38 PM

## 2022-09-27 NOTE — Telephone Encounter (Signed)
Patient's creatinine chronically high. Will change CTA chest to CT chest w/o contrast.

## 2022-09-28 ENCOUNTER — Ambulatory Visit
Admission: RE | Admit: 2022-09-28 | Discharge: 2022-09-28 | Disposition: A | Discharge status: Home / Self Care | Payer: Medicare Other | Source: Ambulatory Visit | Attending: Thoracic Surgery (Cardiothoracic Vascular Surgery) | Admitting: Thoracic Surgery (Cardiothoracic Vascular Surgery)

## 2022-09-28 ENCOUNTER — Other Ambulatory Visit: Payer: Medicare Other

## 2022-09-28 ENCOUNTER — Encounter: Payer: Self-pay | Admitting: Cardiology

## 2022-09-28 ENCOUNTER — Ambulatory Visit: Payer: Medicare Other | Attending: Cardiology | Admitting: Cardiology

## 2022-09-28 VITALS — BP 132/76 | HR 51 | Ht 73.0 in | Wt 215.1 lb

## 2022-09-28 DIAGNOSIS — I447 Left bundle-branch block, unspecified: Secondary | ICD-10-CM

## 2022-09-28 DIAGNOSIS — I7121 Aneurysm of the ascending aorta, without rupture: Secondary | ICD-10-CM

## 2022-09-28 DIAGNOSIS — I255 Ischemic cardiomyopathy: Secondary | ICD-10-CM | POA: Diagnosis not present

## 2022-09-28 DIAGNOSIS — J9811 Atelectasis: Secondary | ICD-10-CM | POA: Diagnosis not present

## 2022-09-28 DIAGNOSIS — E785 Hyperlipidemia, unspecified: Secondary | ICD-10-CM

## 2022-09-28 DIAGNOSIS — Z95820 Peripheral vascular angioplasty status with implants and grafts: Secondary | ICD-10-CM

## 2022-09-28 DIAGNOSIS — J90 Pleural effusion, not elsewhere classified: Secondary | ICD-10-CM | POA: Diagnosis not present

## 2022-09-28 DIAGNOSIS — Z950 Presence of cardiac pacemaker: Secondary | ICD-10-CM | POA: Diagnosis not present

## 2022-09-28 DIAGNOSIS — Z86711 Personal history of pulmonary embolism: Secondary | ICD-10-CM | POA: Diagnosis not present

## 2022-09-28 DIAGNOSIS — R911 Solitary pulmonary nodule: Secondary | ICD-10-CM | POA: Diagnosis not present

## 2022-09-28 DIAGNOSIS — I10 Essential (primary) hypertension: Secondary | ICD-10-CM | POA: Diagnosis not present

## 2022-09-28 DIAGNOSIS — I700 Atherosclerosis of aorta: Secondary | ICD-10-CM | POA: Diagnosis not present

## 2022-09-28 NOTE — Progress Notes (Signed)
Cardiology Office Note:    Date:  09/28/2022   ID:  Timothy Moran, DOB Aug 08, 1946, MRN [id number]  PCP:  Cari Caraway, MD  Cardiologist:  Jenne Campus, MD    Referring MD: Cari Caraway, MD   Chief Complaint  Patient presents with   Dizziness   Shortness of Breath    History of Present Illness:    Timothy Moran is a 77 y.o. male  with incredibly complex past medical history.  Initially he was seen in my office because of history of pulmonary emboli he did have mild pulmonary hypertension but overall was doing quite well.  However at the end of January he was shoveling the snow started having chest pain 1 back and collapse he was found to be in V. fib he was defibrillated brought to [facility] he was found to have acute STEMI involving circumflex artery that was addressed with PTCA and drug-eluting stent.  He does have residual 80% stenosis of the mid LAD he ended up having diminished ejection fraction with level of 25%.  He did wear LifeVest however absolutely hated this and does not want to wear it anymore.  He comes today to my office complaining of having some fatigue tiredness as well as dizziness" upon getting up. In August he did have PTCA and stenting of the LAD lesion he tolerated quite well and since that time he is doing much better.  He said he got much more energy.   Biggest complaint that he have his dizziness.  Does not look like it is cardiac dizziness with changing position of the body will trigger this turning his head will trigger this closing his eyes make this much worse.  It is kind of constant sensation.  He is joining physical therapy and just started my hopefully that will improve it look like truly vertigo what he is complaining about.  No chest pain tightness squeezing pressure burning chest but he complained of being weak tired exhausted.  He admits that he does not do any exercises the right now and majority of reason for that is the fact that weather  is quite cold.  And truly looking today interrogation of his device showing the patient activity between 1 to 1-1/2 hours no more than that and just been gone since March 2023.  Some not sure if what I have much to do with that.  Past Medical History:  Diagnosis Date   Acute blood loss anemia 10/05/2020   Acute respiratory failure ([place]), hypothermia therapy, vent - extubated 10/06/20    AKI (acute kidney injury) ([place]) 10/05/2020   Anoxic brain injury ([place]) 10/26/2020   Arrhythmia    Ascending aortic aneurysm ([place]) 01/29/2018   a. 2019 4.3 cm by echo; b. 02/2021 Echo: Ao root 76m.   Benign brain tumor ([place]    Benign neoplasm of brain ([facility] 12/19/2017   Cardiac arrest with ventricular fibrillation ([place]    Chest pain 12/04/2017   Chronic kidney disease, stage III (moderate) ([facility] 12/19/2017   CKD (chronic kidney disease), stage III - IV (HCC)    Coronary Artery Disease    a. 10/1749Inf STEMI complicated by cardiac arrest/PCI: LAD 50p/m, LCX 100d (2.5 x 30 Resolute Onyx DES); b. 04/2021 PCI: 04/2021 LM nl, LAD 50p/860m2.5x30 Onyx Frontier DES), LCX 25p, 5064mFR 0.98), 40d ISR (RFR 0.98), OM2 25, RCA mild diff dzs.   Diabetes mellitus, type 2 ([facility]1/25/2022   10/06/20 A1C 7%   Dizziness 10/11/2019   Encounter for  imaging study to confirm orogastric (OG) tube placement    Hematuria 10/26/2020   HFrEF (heart failure with reduced ejection fraction) ([place])    a. 11/2020 Echo: EF 25-30%; b. 02/2021 Echo: EF 25-30%, glob HK. Mild LVH. Nl RV fxn. Triv AI. Ao root 73m.   History of pulmonary embolus (PE)    HLD (hyperlipidemia) 10/26/2020   Hypertension    Iron deficiency anemia rec'd IV iron 10/26/2020   Ischemic cardiomyopathy    a. 11/2020 Echo: EF 25-30%; b. 02/2021 Echo: EF 25-30%, glob HK.   Left bundle branch block 12/19/2017   Meningioma ([place] 08/14/2020   Nonsustained ventricular tachycardia ([place] 10/11/2019   Pain of left hip joint 05/29/2020   Personal history of pulmonary embolism  12/19/2017   Pneumonia of both lungs due to infectious organism    Pulmonary hypertension due to thromboembolism ([facility] 03/09/2018   See echo 12/27/17 with nl PAS vs CTa chest 02/23/18    S/P angioplasty with stent 10/04/20 DES to LCX  10/26/2020   Sinus bradycardia 12/22/2017   SOB (shortness of breath)    Solitary pulmonary nodule on lung CT 03/08/2018   CT 12/04/17 1.0 x 0.8 x 0.7 cm nodular opacity in the RUL vs not seen 03/16/10 posterior segment of the right upper lobe. .Mar[place] Kitchenpirometry 03/08/2018    FEV1 3.86 (108%)  Ratio 96 s prior rx  - PET  03/13/18   Low grade c/w adenoca > rec T surgery eval     STEMI (ST elevation myocardial infarction) ([facility] 10/04/2020   Urinary retention 10/26/2020   Vestibular schwannoma ([place] [place] 08/14/2020    Past Surgical History:  Procedure Laterality Date   APPENDECTOMY     BIV ICD INSERTION CRT-D N/A 11/03/2021   Procedure: BIV ICD INSERTION CRT-D;  Surgeon: CConstance Haw MD;  [facility];  Service: Cardiovascular;  Laterality: N/A;   CARDIAC CATHETERIZATION     CORONARY ANGIOPLASTY WITH STENT PLACEMENT Left 04/12/2021   LAD stent placement   CORONARY STENT INTERVENTION N/A 04/12/2021   Procedure: CORONARY STENT INTERVENTION;  Surgeon: ENelva Bush MD;  [facility];  Service: Cardiovascular;  Laterality: N/A;   CORONARY/GRAFT ACUTE MI REVASCULARIZATION N/A 10/04/2020   Procedure: Coronary/Graft Acute MI Revascularization;  Surgeon: ENelva Bush MD;  [facility];  Service: Cardiovascular;  Laterality: N/A;   HERNIA REPAIR     INTRAVASCULAR PRESSURE WIRE/FFR STUDY N/A 04/12/2021   Procedure: INTRAVASCULAR PRESSURE WIRE/FFR STUDY;  Surgeon: ENelva Bush MD;  [facility];  Service: Cardiovascular;  Laterality: N/A;   INTRAVASCULAR ULTRASOUND/IVUS N/A 04/12/2021   Procedure: Intravascular Ultrasound/IVUS;  Surgeon: ENelva Bush MD;  [facility];  Service:  Cardiovascular;  Laterality: N/A;   LEFT HEART CATH AND CORONARY ANGIOGRAPHY N/A 10/04/2020   Procedure: LEFT HEART CATH AND CORONARY ANGIOGRAPHY;  Surgeon: ENelva Bush MD;  [facility];  Service: Cardiovascular;  Laterality: N/A;   RIGHT/LEFT HEART CATH AND CORONARY ANGIOGRAPHY N/A 04/12/2021   Procedure: RIGHT/LEFT HEART CATH AND CORONARY ANGIOGRAPHY;  Surgeon: ENelva Bush MD;  [facility];  Service: Cardiovascular;  Laterality: N/A;    Current Medications: Current Meds  Medication Sig   acetaminophen (TYLENOL) 325 MG tablet Take 1-2 tablets (325-650 mg total) by mouth every 4 (four) hours as needed for mild pain.   amiodarone (PACERONE) 200 MG tablet Take 1 tablet (200 mg total) by mouth 2 (two) times daily.   aspirin EC 81  MG tablet Take 81 mg by mouth every other day. Swallow whole.   atorvastatin (LIPITOR) 80 MG tablet Take 1 tablet (80 mg total) by mouth daily.   B Complex-C (SUPER B COMPLEX PO) Take 1 tablet by mouth daily. Unknown strength   diclofenac Sodium (VOLTAREN) 1 % GEL Apply 4 g topically 4 (four) times daily. (Patient taking differently: Apply 4 g topically 4 (four) times daily. Using as needed)   fluticasone (FLONASE) 50 MCG/ACT nasal spray Place 1-2 sprays into both nostrils at bedtime.   isosorbide dinitrate (ISORDIL) 30 MG tablet Take 1 tablet (30 mg total) by mouth 2 (two) times daily.   melatonin 5 MG TABS Take 1 tablet (5 mg total) by mouth at bedtime.   metoprolol succinate (TOPROL XL) 25 MG 24 hr tablet Take 1 tablet (25 mg total) by mouth daily.   Multiple Vitamin (MULTIVITAMIN WITH MINERALS) TABS tablet Take 1 tablet by mouth daily.   senna-docusate (SENOKOT-S) 8.6-50 MG tablet Take 1 tablet by mouth at bedtime as needed for mild constipation.   sodium bicarbonate 650 MG tablet Take 650 mg by mouth 2 (two) times daily.     Allergies:   Hydralazine   Social History   Socioeconomic History   Marital status: Married     Spouse name: Demarlo Riojas    Number of children: 1   Years of education: 16   Highest education level: Bachelor's degree (e.g., BA, AB, BS)  Occupational History   Not on file  Tobacco Use   Smoking status: Former    Packs/day: 0.50    Years: 5.00    Total pack years: 2.50    Types: Cigarettes    Quit date: 09/12/1968    Years since quitting: 54.0   Smokeless tobacco: Never  Vaping Use   Vaping Use: Never used  Substance and Sexual Activity   Alcohol use: Yes    Comment: ONE PER WEEK   Drug use: Never   Sexual activity: Not Currently  Other Topics Concern   Not on file  Social History Narrative   Not on file   Social Determinants of Health   Financial Resource Strain: Not on file  Food Insecurity: Not on file  Transportation Needs: Not on file  Physical Activity: Not on file  Stress: Not on file  Social Connections: Not on file     Family History: The patient's family history includes Brain cancer in his sister; Diabetes in his mother; Leukemia in his brother; Melanoma in his brother. ROS:   Please see the history of present illness.    All 14 point review of systems negative except as described per history of present illness  EKGs/Labs/Other Studies Reviewed:      Recent Labs: 01/25/2022: Magnesium 2.0 04/08/2022: NT-Pro BNP 3,220 05/01/2022: Hemoglobin 10.8; Platelets 278 08/24/2022: ALT 19; BUN 21; Creatinine, Ser 2.15; Potassium 4.6; Sodium 144; TSH 4.620  Recent Lipid Panel    Component Value Date/Time   CHOL 84 04/13/2021 0317   CHOL 247 (H) 06/01/2020 0858   TRIG 201 (H) 04/13/2021 0317   HDL 26 (L) 04/13/2021 0317   HDL 33 (L) 06/01/2020 0858   CHOLHDL 3.2 04/13/2021 0317   VLDL 40 04/13/2021 0317   LDLCALC 18 04/13/2021 0317   LDLCALC 151 (H) 06/01/2020 0858    Physical Exam:    VS:  BP 132/76 (BP Location: Left Arm, Patient Position: Sitting)   Pulse (!) 51   Ht '6\' 1"'$  (1.854 m)   Wt 215  lb 1.3 oz (97.6 kg)   SpO2 97%   BMI 28.38  kg/m     Wt Readings from Last 3 Encounters:  09/28/22 215 lb 1.3 oz (97.6 kg)  08/24/22 219 lb 9.6 oz (99.6 kg)  07/11/22 221 lb 9.6 oz (100.5 kg)     GEN:  Well nourished, well developed in no acute distress HEENT: Normal NECK: No JVD; No carotid bruits LYMPHATICS: No lymphadenopathy CARDIAC: RRR, no murmurs, no rubs, no gallops RESPIRATORY:  Clear to auscultation without rales, wheezing or rhonchi  ABDOMEN: Soft, non-tender, non-distended MUSCULOSKELETAL:  No edema; No deformity  SKIN: Warm and dry LOWER EXTREMITIES: no swelling NEUROLOGIC:  Alert and oriented x 3 PSYCHIATRIC:  Normal affect   ASSESSMENT:    1. Ischemic cardiomyopathy   2. Left bundle branch block   3. Primary hypertension   4. S/P angioplasty with stent 10/04/20 DES to LCX    5. Status post biventricular pacemaker - MRI compatible PPM and leads   6. Hyperlipidemia LDL goal <70   7. History of pulmonary embolus (PE)    PLAN:    In order of problems listed above:  History of ischemic cardiomyopathy have difficulty tolerating medications.  The only thing he is on is actually Isodril as well as beta-blocker.  He is ejection fraction is unchanged 30 to 35%.  This is again ischemic cardiomyopathy.  I have difficulty putting him additional medications.  ACE inhibitor or ARB Delene Loll is out of the question because of kidney dysfunction.  I do have his Chem-7 which showed his creatinine of 2.15. Left bundle branch which is chronic noted, he does have BiV pacing and percentage of pacing is 95%. Pulmonary hypertension stable. Status post angioplasty stable denies have any chest pain tightness squeezing pressure burning chest. Status post biventricular pacing he did his Medtr[employer] device I did review interrogation of the device doing well. Dyslipidemia I will check his fasting lipid profile.  K PN show me his last LDL of 18.0 which is low   Medication Adjustments/Labs and Tests Ordered: Current medicines are  reviewed at length with the patient today.  Concerns regarding medicines are outlined above.  Orders Placed This Encounter  Procedures   EKG 12-Lead   Medication changes: No orders of the defined types were placed in this encounter.   Signed, Park Liter, MD, [facility] 09/28/2022 9:01 AM    [place]

## 2022-09-28 NOTE — Patient Instructions (Addendum)
Medication Instructions:  Your physician recommends that you continue on your current medications as directed. Please refer to the Current Medication list given to you today.  *If you need a refill on your cardiac medications before your next appointment, please call your pharmacy*   Lab Work: Your physician recommends that you return for lab work in: when fasting You need to have labs done when you are fasting.  You can come Monday through Friday 8:00 am to 12:00 pm and 1:00 to 4:30. You do not need to make an appointment as the order has already been placed. The labs you are going to have done are AST, ALT and Lipids.    Testing/Procedures: None Ordered   Follow-Up: At [facility], you and your health needs are our priority.  As part of our continuing mission to provide you with exceptional heart care, we have created designated Provider Care Teams.  These Care Teams include your primary Cardiologist (physician) and Advanced Practice Providers (APPs -  Physician Assistants and Nurse Practitioners) who all work together to provide you with the care you need, when you need it.  We recommend signing up for the patient portal called "MyChart".  Sign up information is provided on this After Visit Summary.  MyChart is used to connect with patients for Virtual Visits (Telemedicine).  Patients are able to view lab/test results, encounter notes, upcoming appointments, etc.  Non-urgent messages can be sent to your provider as well.   To learn more about what you can do with MyChart, go to [email or web address].    Your next appointment:   5 month(s)  The format for your next appointment:   In Person  Provider:   Jenne Campus, MD    Other Instructions NA

## 2022-09-29 ENCOUNTER — Encounter: Payer: Self-pay | Admitting: Physical Therapy

## 2022-09-29 ENCOUNTER — Ambulatory Visit: Payer: Medicare Other | Admitting: Physical Therapy

## 2022-09-29 DIAGNOSIS — R2681 Unsteadiness on feet: Secondary | ICD-10-CM | POA: Diagnosis not present

## 2022-09-29 DIAGNOSIS — R278 Other lack of coordination: Secondary | ICD-10-CM | POA: Diagnosis not present

## 2022-09-29 DIAGNOSIS — D329 Benign neoplasm of meninges, unspecified: Secondary | ICD-10-CM | POA: Diagnosis not present

## 2022-09-29 DIAGNOSIS — R531 Weakness: Secondary | ICD-10-CM | POA: Diagnosis not present

## 2022-09-29 DIAGNOSIS — M6281 Muscle weakness (generalized): Secondary | ICD-10-CM

## 2022-09-29 DIAGNOSIS — R296 Repeated falls: Secondary | ICD-10-CM

## 2022-09-29 NOTE — Therapy (Signed)
OUTPATIENT PHYSICAL THERAPY THORACOLUMBAR TREATMENT   Patient Name: Timothy Moran MRN: [id number] DOB:02-15-1946, 77 y.o., male Today's Date: 09/29/2022   PT End of Session - 09/29/22 1458     Visit Number 4    Date for PT Re-Evaluation 12/20/22    Authorization Type Medicare    PT Start Time 1445    PT Stop Time 2440    PT Time Calculation (min) 45 min    Activity Tolerance Patient tolerated treatment well    Behavior During Therapy [facility] for tasks assessed/performed              Past Medical History:  Diagnosis Date   Acute blood loss anemia 10/05/2020   Acute respiratory failure ([place]), hypothermia therapy, vent - extubated 10/06/20    AKI (acute kidney injury) ([place]) 10/05/2020   Anoxic brain injury ([place]) 10/26/2020   Arrhythmia    Ascending aortic aneurysm ([place]) 01/29/2018   a. 2019 4.3 cm by echo; b. 02/2021 Echo: Ao root 25m.   Benign brain tumor ([place]    Benign neoplasm of brain ([place] 12/19/2017   Cardiac arrest with ventricular fibrillation ([place]    Chest pain 12/04/2017   Chronic kidney disease, stage III (moderate) ([place] 12/19/2017   CKD (chronic kidney disease), stage III - IV (HCC)    Coronary Artery Disease    a. 11/0272Inf STEMI complicated by cardiac arrest/PCI: LAD 50p/m, LCX 100d (2.5 x 30 Resolute Onyx DES); b. 04/2021 PCI: 04/2021 LM nl, LAD 50p/850m2.5x30 Onyx Frontier DES), LCX 25p, 5019mFR 0.98), 40d ISR (RFR 0.98), OM2 25, RCA mild diff dzs.   Diabetes mellitus, type 2 ([place]1/25/2022   10/06/20 A1C 7%   Dizziness 10/11/2019   Encounter for imaging study to confirm orogastric (OG) tube placement    Hematuria 10/26/2020   HFrEF (heart failure with reduced ejection fraction) ([place]  a. 11/2020 Echo: EF 25-30%; b. 02/2021 Echo: EF 25-30%, glob HK. Mild LVH. Nl RV fxn. Triv AI. Ao root 48m38m History of pulmonary embolus (PE)    HLD (hyperlipidemia) 10/26/2020   Hypertension    Iron deficiency anemia rec'd IV iron 10/26/2020   Ischemic  cardiomyopathy    a. 11/2020 Echo: EF 25-30%; b. 02/2021 Echo: EF 25-30%, glob HK.   Left bundle branch block 12/19/2017   Meningioma (HCC)[place]/11/2019   Nonsustained ventricular tachycardia (HCC)[place]/29/2021   Pain of left hip joint 05/29/2020   Personal history of pulmonary embolism 12/19/2017   Pneumonia of both lungs due to infectious organism    Pulmonary hypertension due to thromboembolism (HCC)[place]/28/2019   See echo 12/27/17 with nl PAS vs CTa chest 02/23/18    S/P angioplasty with stent 10/04/20 DES to LCX  10/26/2020   Sinus bradycardia 12/22/2017   SOB (shortness of breath)    Solitary pulmonary nodule on lung CT 03/08/2018   CT 12/04/17 1.0 x 0.8 x 0.7 cm nodular opacity in the RUL vs not seen 03/16/10 posterior segment of the right upper lobe. .SpiMarland Kitchenometry 03/08/2018    FEV1 3.86 (108%)  Ratio 96 s prior rx  - PET  03/13/18   Low grade c/w adenoca > rec T surgery eval     STEMI (ST elevation myocardial infarction) (HCC)[place]/23/2022   Urinary retention 10/26/2020   Vestibular schwannoma (HCC)[place]/11/2019   Past Surgical History:  Procedure Laterality Date   APPENDECTOMY     BIV ICD INSERTION CRT-D N/A 11/03/2021   Procedure: BIV ICD INSERTION CRT-D;  Surgeon:  Constance Haw, MD;  Location: [place] [facility];  Service: Cardiovascular;  Laterality: N/A;   CARDIAC CATHETERIZATION     CORONARY ANGIOPLASTY WITH STENT PLACEMENT Left 04/12/2021   LAD stent placement   CORONARY STENT INTERVENTION N/A 04/12/2021   Procedure: CORONARY STENT INTERVENTION;  Surgeon: Nelva Bush, MD;  Location: [place] [facility];  Service: Cardiovascular;  Laterality: N/A;   CORONARY/GRAFT ACUTE MI REVASCULARIZATION N/A 10/04/2020   Procedure: Coronary/Graft Acute MI Revascularization;  Surgeon: Nelva Bush, MD;  Location: [place] [facility];  Service: Cardiovascular;  Laterality: N/A;   HERNIA REPAIR     INTRAVASCULAR PRESSURE WIRE/FFR STUDY N/A 04/12/2021   Procedure: INTRAVASCULAR PRESSURE WIRE/FFR  STUDY;  Surgeon: Nelva Bush, MD;  Location: [place] [facility];  Service: Cardiovascular;  Laterality: N/A;   INTRAVASCULAR ULTRASOUND/IVUS N/A 04/12/2021   Procedure: Intravascular Ultrasound/IVUS;  Surgeon: Nelva Bush, MD;  Location: [place] [facility];  Service: Cardiovascular;  Laterality: N/A;   LEFT HEART CATH AND CORONARY ANGIOGRAPHY N/A 10/04/2020   Procedure: LEFT HEART CATH AND CORONARY ANGIOGRAPHY;  Surgeon: Nelva Bush, MD;  Location: [place] [facility];  Service: Cardiovascular;  Laterality: N/A;   RIGHT/LEFT HEART CATH AND CORONARY ANGIOGRAPHY N/A 04/12/2021   Procedure: RIGHT/LEFT HEART CATH AND CORONARY ANGIOGRAPHY;  Surgeon: Nelva Bush, MD;  Location: [place] [facility];  Service: Cardiovascular;  Laterality: N/A;   Patient Active Problem List   Diagnosis Date Noted   Anemia    Leukocytosis    Right arm weakness 01/25/2022   Right sided weakness    Fall at home 01/22/2022   Status post biventricular pacemaker - MRI compatible PPM and leads 01/22/2022   Chronic indwelling Foley catheter 01/22/2022   CKD (chronic kidney disease), stage III - IV ([place]) 06/28/2021   Chronic HFrEF (heart failure with reduced ejection fraction) ([place]) 03/31/2021   Arrhythmia    DM (diabetes mellitus), type 2 ([place]) new 10/26/2020   Urinary retention 10/26/2020   Hyperlipidemia LDL goal <70 10/26/2020   Coronary artery disease of native artery of native heart with stable angina pectoris ([place]) 10/26/2020   S/P angioplasty with stent 10/04/20 DES to LCX  10/26/2020   Ischemic cardiomyopathy 10/26/2020   Diabetes mellitus, type 2 ([place]) 10/06/2020   DOE (dyspnea on exertion)    Meningioma ([place]) 08/14/2020   Vestibular schwannoma ([place]) 08/14/2020   Cardiac arrest with ventricular fibrillation (HCC)    Pain of left hip joint 05/29/2020   Benign brain tumor ([place])    Chronic kidney disease    History of pulmonary embolus (PE)    Hypertension    Nonsustained ventricular tachycardia  ([place]) 10/11/2019   Dizziness 10/11/2019   Abnormal echocardiogram 06/07/2019   Pulmonary hypertension due to thromboembolism ([place]) 03/09/2018   Solitary pulmonary nodule on lung CT 03/08/2018   Ascending aortic aneurysm (HCC) 4.2 cm based on CT from December 2020 01/29/2018   Sinus bradycardia 12/22/2017   Left bundle branch block 12/19/2017   Personal history of pulmonary embolism - no longer on anticoagulation due to pt refusal to take it anymore. 12/19/2017   Chronic kidney disease, stage III (moderate) ([place]) 12/19/2017   Benign neoplasm of brain ([place]) 12/19/2017    PCP: Cari Caraway, MD   REFERRING PROVIDER: Cari Caraway, MD   REFERRING DIAG: Falls, unsteady gait, mm weakness Rationale for Evaluation and Treatment Rehabilitation  THERAPY DIAG:  Unsteadiness on feet  Right sided weakness  Other lack of coordination  Meningioma ([place])  Repeated falls  Muscle  weakness (generalized)  ONSET DATE: 05/10/2022   SUBJECTIVE:                                                                                                                                                                                           SUBJECTIVE STATEMENT: Reports that the last treatment was a "little much" tired and very shaky and unsteady  PERTINENT HISTORY:  Brief HPI:   Dell Briner is a 77 y.o. male who fell backward striking his head and right neck shoulder area on 01/21/2022. He complained of right hemiparesis. CT head revealed left parafalcine meningioma.Started on Decadron with improvement in symptoms   PAIN:  Are you having pain? no PRECAUTIONS: ICD/Pacemaker  WEIGHT BEARING RESTRICTIONS No  FALLS:  Has patient fallen in last 6 months? Yes. Number of falls 2  LIVING ENVIRONMENT: Lives with: lives with their spouse Lives in: House/apartment Stairs: Yes: External: 7 steps; bilateral but cannot reach both Has following equipment at home: Walker - 2 wheeled and a cane, shower  seat  OCCUPATION: Retired  PLOF: Independent  PATIENT GOALS Feel normal with standing and functioning.  Feel stronger and have better balance   OBJECTIVE:   DIAGNOSTIC FINDINGS:  CT Lumbar- 3. Moderate degenerative spinal stenosis at L2-3. CT CERVICAL IMPRESSION: 1. No evidence of intracranial injury. 2. Negative for cervical spine fracture but there is prevertebral edema at C4-5 suggesting soft tissue injury. 3. Meningiomas with left cerebral of edema, reference brain MRI 01/22/2022.  SCREENING FOR RED FLAGS: Bowel or bladder incontinence: No Spinal tumors: No Cauda equina syndrome: No Compression fracture: No Abdominal aneurysm: Yes: Abdominal aortic aneurysm, stable  COGNITION:  Overall cognitive status: WFL     SENSATION: WFL Patient reports tingling in B hands, all over.  MUSCLE LENGTH: Hamstrings: approximately 55 degrees B.  Was 70 degrees in September 2023  POSTURE: rounded shoulders and forward head  PALPATION: No TTP in R shoulder or up traps. B up traps tight, low back TTP R and L proximal to Illiac crest  LUMBAR ROM:  AROM limited in all directions, but he lost his balance frequently, so accuracy of testing limited.   LOWER EXTREMITY ROM:      General mild stiffness throughout BLE with PROM.  LOWER EXTREMITY MMT:   MMT Right eval Left eval  Hip flexion 4- 4-  Hip extension    Hip abduction 4 4  Hip adduction    Hip internal rotation    Hip external rotation    Knee flexion 4+ 4-  Knee extension 4+ 4-  Ankle dorsiflexion 4 4  Ankle plantarflexion    Ankle inversion  Ankle eversion     (Blank rows = not tested)  LUMBAR SPECIAL TESTS:  Slump test: Positive R  Shoulder Special Tests empty can test (+) R, Hawkins-Kennedy test (+) R. Previous MRI reveals full tear of supraspinatus and partial tear of infraspinatus.  FUNCTIONAL TESTS:  5 times sit to stand: 30 seconds struggled with weak knees Timed up and go (TUG): 27 seconds with  SPC Functional gait assessment: not tested BERG balance test:  23/56  GAIT: Distance walked: 80 Assistive device utilized: SPC, very unsteady used walls to stabilize, knees very shaky and unsteady, needing CGA to min A Level of assistance: CGA to min A for LOB Comments: Patient was unsteady with turns, relied on UE support for balance., legs shaky, presents like he has neuropathy but he does not, also reports double vision    TODAY'S TREATMENT  09/29/22 Nustep level 4 x 6 minutes Bike Level 3 x 6 minutes On foot on airex standing balance Alternating 4" toe touches with using 1 and 2 canes In pbars side stepping walking FWD/BKWD Bend over and touch cone, difficulty coming up. Head turns while standing  09/27/22 Nustep level 5 x 6 mintues Leg curls 20# single legs each 2x10 Leg extenison single legs 5# each leg 2x10 In pbars, on airex balance beam side stepping and tandem walking Volleyball with back of knees touching the mat table and then without Back to wall but away from it doing butt touches and shoulder blade touches to the wall working on hip and ankle strategies Sit to stand with and without weighted ball Direction changes Leg press 20# 2x10 Walking ball toss  09/22/22 Bike L4 x 6 minutes Supine SLR x 10 with each leg Bridge with abd resistance at knees with g tband, 10 reps Alternating march with pelvic tilt, 10 reps each. Attempted sit to stand from mat, but too challenging. Mini squats with BUE support for balance, x 10 Standing on Air ex pad, forward and back weight shifts, occasional min A for balance Alternating step taps on 4" step, then placed one foot on step. Standing rotation, no UE support Occasional unsteadiness, but no LOB noted. Quick side to side steps x 60 sec. Therapist providing consistent VC to keep the speed up  Mildly unsteady, but no LOB.   PATIENT EDUCATION:  Education details: POC,  Person educated: Patient and Spouse Education method:  Explanation Education comprehension: verbalized understanding   HOME EXERCISE PROGRAM: [place]   ASSESSMENT:  CLINICAL IMPRESSION: Patient really with a lot of diffiulty with his balance, reports that he may do some vision tests.  He really needs CGA and belt due to the balance, gave the HEP and went over with hima nd his wife  OBJECTIVE IMPAIRMENTS Abnormal gait, decreased activity tolerance, decreased balance, decreased cognition, decreased coordination, difficulty walking, decreased ROM, decreased strength, increased muscle spasms, impaired flexibility, postural dysfunction, and pain.   ACTIVITY LIMITATIONS carrying, lifting, bending, standing, squatting, stairs, reach over head, and locomotion level  PARTICIPATION LIMITATIONS: cleaning, laundry, driving, shopping, and community activity  PERSONAL FACTORS Age and 1 comorbidity: multiple meningiomas  are also affecting patient's functional outcome.   REHAB POTENTIAL: Good  CLINICAL DECISION MAKING: Evolving/moderate complexity  EVALUATION COMPLEXITY: Moderate   GOALS: Goals reviewed with patient? Yes  SHORT TERM GOALS: Target date: 10/04/22  I with initial HEP Baseline: Goal status: ongoing  LONG TERM GOALS: Target date: 12/20/22  I with final HEP Baseline:  Goal status: INITIAL  2.  Patient will have no  falls over a 6 week period Baseline: 2 falls  Goal status: ongoing  3.  Complete 5 x STS in <12 sec to demonstrate improved LE strength Baseline: 30 seconds Goal status: INITIAL  4.  Patient will complete TUG in < 12 sec with or without AD Baseline: 27 seconds Goal status: INITIAL  5.  Patient will score at least 43/56 on Berg balance Baseline: 23/56 Goal status: INITIAL  6.  Patient will walk at least 600' with LRAD, MI on level an unlevel surfaces, back pain < 3/10 Baseline: 80', unsteady, SPC with CGA/Min A Goal status: INITIAL   PLAN: PT FREQUENCY: 2x/week  PT DURATION: 12 weeks  PLANNED  INTERVENTIONS: Therapeutic exercises, Therapeutic activity, Neuromuscular re-education, Balance training, Gait training, Patient/Family education, Self Care, Joint mobilization, Stair training, Dry Needling, Electrical stimulation, Cryotherapy, Moist heat, Ultrasound, Ionotophoresis '4mg'$ /ml Dexamethasone, and Manual therapy.  PLAN FOR NEXT SESSION: work on balance and strength  Delorse Lek 09/29/22 3:03 PM

## 2022-10-03 ENCOUNTER — Encounter: Payer: Self-pay | Admitting: Cardiology

## 2022-10-03 ENCOUNTER — Telehealth: Payer: Self-pay | Admitting: Cardiology

## 2022-10-03 ENCOUNTER — Ambulatory Visit: Payer: Medicare Other | Attending: Cardiology | Admitting: Cardiology

## 2022-10-03 VITALS — BP 100/72 | HR 58 | Ht 73.0 in | Wt 215.1 lb

## 2022-10-03 DIAGNOSIS — I493 Ventricular premature depolarization: Secondary | ICD-10-CM | POA: Diagnosis not present

## 2022-10-03 DIAGNOSIS — Z79899 Other long term (current) drug therapy: Secondary | ICD-10-CM | POA: Insufficient documentation

## 2022-10-03 DIAGNOSIS — I5022 Chronic systolic (congestive) heart failure: Secondary | ICD-10-CM | POA: Diagnosis not present

## 2022-10-03 DIAGNOSIS — I2510 Atherosclerotic heart disease of native coronary artery without angina pectoris: Secondary | ICD-10-CM | POA: Diagnosis not present

## 2022-10-03 DIAGNOSIS — E785 Hyperlipidemia, unspecified: Secondary | ICD-10-CM | POA: Diagnosis not present

## 2022-10-03 DIAGNOSIS — I255 Ischemic cardiomyopathy: Secondary | ICD-10-CM | POA: Diagnosis not present

## 2022-10-03 LAB — CUP PACEART INCLINIC DEVICE CHECK
Date Time Interrogation Session: 20240122142600
Implantable Lead Connection Status: 753985
Implantable Lead Connection Status: 753985
Implantable Lead Connection Status: 753985
Implantable Lead Implant Date: 20230222
Implantable Lead Implant Date: 20230222
Implantable Lead Implant Date: 20230222
Implantable Lead Location: 753858
Implantable Lead Location: 753859
Implantable Lead Location: 753860
Implantable Lead Model: 4798
Implantable Lead Model: 5076
Implantable Pulse Generator Implant Date: 20230222

## 2022-10-03 NOTE — Telephone Encounter (Signed)
Pt advised to stop Metoprolol Patient verbalized understanding and agreeable to plan.

## 2022-10-03 NOTE — Telephone Encounter (Signed)
  Pt c/o medication issue:  1. Name of Medication: Metoprolol 50 mg   2. How are you currently taking this medication (dosage and times per day)? 1 Tablet a day  3. Are you having a reaction (difficulty breathing--STAT)? No   4. What is your medication issue? Pt called back to tell [name] that he is taking metoprolol 50 mg 1 tablet a day

## 2022-10-03 NOTE — Progress Notes (Signed)
Electrophysiology Office Note   Date:  10/03/2022   ID:  Timothy Moran, DOB [birth year], MRN [id number]  PCP:  Cari Caraway, MD  Cardiologist:  Agustin Cree Primary Electrophysiologist:  Kedra Mcglade Meredith Leeds, MD    Chief Complaint: CHF   History of Present Illness: Timothy Moran is a 77 y.o. male who is being seen today for the evaluation of CHF at the request of Cari Caraway, MD. Presenting today for electrophysiology evaluation.  He has a history significant for ascending aortic aneurysm, CKD stage III, coronary disease, type 2 diabetes, chronic [place] heart failure, PE, hyperlipidemia, hypertension.  January 2022 shoveling snow and had chest pain and collapsed.  He had ventricular fibrillation.  He was diagnosed with acute STEMI and is post circumflex stent.  He presented to the Cath Lab August 2022 and had stenting of an 80% LAD lesion.  He status post Medtronic CRT-D implanted 11/03/2021.  He was found to have a low burden of biventricular pacing due to PVCs.  He is now on amiodarone.  Today, denies symptoms of palpitations, chest pain, shortness of breath, orthopnea, PND, lower extremity edema, claudication, dizziness, presyncope, syncope, bleeding, or neurologic sequela. The patient is tolerating medications without difficulties.  He continues to have dizziness upon standing.  Aside from that, he has been feeling well.  He has been going to physical therapy to work on his dizziness and unsteadiness.   Past Medical History:  Diagnosis Date   Acute blood loss anemia 10/05/2020   Acute respiratory failure ([place]), hypothermia therapy, vent - extubated 10/06/20    AKI (acute kidney injury) ([place]) 10/05/2020   Anoxic brain injury ([place]) 10/26/2020   Arrhythmia    Ascending aortic aneurysm ([place]) 01/29/2018   a. 2019 4.3 cm by echo; b. 02/2021 Echo: Ao root 22m.   Benign brain tumor ([place]    Benign neoplasm of brain ([place] 12/19/2017   Cardiac arrest with ventricular fibrillation  ([place]    Chest pain 12/04/2017   Chronic kidney disease, stage III (moderate) ([place] 12/19/2017   CKD (chronic kidney disease), stage III - IV (HCC)    Coronary Artery Disease    a. 14/0981Inf STEMI complicated by cardiac arrest/PCI: LAD 50p/m, LCX 100d (2.5 x 30 Resolute Onyx DES); b. 04/2021 PCI: 04/2021 LM nl, LAD 50p/839m2.5x30 Onyx Frontier DES), LCX 25p, 5030mFR 0.98), 40d ISR (RFR 0.98), OM2 25, RCA mild diff dzs.   Diabetes mellitus, type 2 ([place]1/25/2022   10/06/20 A1C 7%   Dizziness 10/11/2019   Encounter for imaging study to confirm orogastric (OG) tube placement    Hematuria 10/26/2020   HFrEF (heart failure with reduced ejection fraction) ([place]  a. 11/2020 Echo: EF 25-30%; b. 02/2021 Echo: EF 25-30%, glob HK. Mild LVH. Nl RV fxn. Triv AI. Ao root 67m73m History of pulmonary embolus (PE)    HLD (hyperlipidemia) 10/26/2020   Hypertension    Iron deficiency anemia rec'd IV iron 10/26/2020   Ischemic cardiomyopathy    a. 11/2020 Echo: EF 25-30%; b. 02/2021 Echo: EF 25-30%, glob HK.   Left bundle branch block 12/19/2017   Meningioma (HCC)[place]/11/2019   Nonsustained ventricular tachycardia (HCC)[place]/29/2021   Pain of left hip joint 05/29/2020   Personal history of pulmonary embolism 12/19/2017   Pneumonia of both lungs due to infectious organism    Pulmonary hypertension due to thromboembolism (HCC)[place]/28/2019   See echo 12/27/17 with nl PAS vs CTa chest 02/23/18    S/P angioplasty with stent  10/04/20 DES to LCX  10/26/2020   Sinus bradycardia 12/22/2017   SOB (shortness of breath)    Solitary pulmonary nodule on lung CT 03/08/2018   CT 12/04/17 1.0 x 0.8 x 0.7 cm nodular opacity in the RUL vs not seen 03/16/10 posterior segment of the right upper lobe. [place] KitchenSpirometry 03/08/2018    FEV1 3.86 (108%)  Ratio 96 s prior rx  - PET  03/13/18   Low grade c/w adenoca > rec T surgery eval     STEMI (ST elevation myocardial infarction) ([place]) 10/04/2020   Urinary retention 10/26/2020   Vestibular  schwannoma ([place]) 08/14/2020   Past Surgical History:  Procedure Laterality Date   APPENDECTOMY     BIV ICD INSERTION CRT-D N/A 11/03/2021   Procedure: BIV ICD INSERTION CRT-D;  Surgeon: Constance Haw, MD;  Location: [facility];  Service: Cardiovascular;  Laterality: N/A;   CARDIAC CATHETERIZATION     CORONARY ANGIOPLASTY WITH STENT PLACEMENT Left 04/12/2021   LAD stent placement   CORONARY STENT INTERVENTION N/A 04/12/2021   Procedure: CORONARY STENT INTERVENTION;  Surgeon: Nelva Bush, MD;  Location: [facility];  Service: Cardiovascular;  Laterality: N/A;   CORONARY/GRAFT ACUTE MI REVASCULARIZATION N/A 10/04/2020   Procedure: Coronary/Graft Acute MI Revascularization;  Surgeon: Nelva Bush, MD;  Location: [facility];  Service: Cardiovascular;  Laterality: N/A;   HERNIA REPAIR     INTRAVASCULAR PRESSURE WIRE/FFR STUDY N/A 04/12/2021   Procedure: INTRAVASCULAR PRESSURE WIRE/FFR STUDY;  Surgeon: Nelva Bush, MD;  Location: [facility];  Service: Cardiovascular;  Laterality: N/A;   INTRAVASCULAR ULTRASOUND/IVUS N/A 04/12/2021   Procedure: Intravascular Ultrasound/IVUS;  Surgeon: Nelva Bush, MD;  Location: [facility];  Service: Cardiovascular;  Laterality: N/A;   LEFT HEART CATH AND CORONARY ANGIOGRAPHY N/A 10/04/2020   Procedure: LEFT HEART CATH AND CORONARY ANGIOGRAPHY;  Surgeon: Nelva Bush, MD;  Location: [facility];  Service: Cardiovascular;  Laterality: N/A;   RIGHT/LEFT HEART CATH AND CORONARY ANGIOGRAPHY N/A 04/12/2021   Procedure: RIGHT/LEFT HEART CATH AND CORONARY ANGIOGRAPHY;  Surgeon: Nelva Bush, MD;  Location: [facility];  Service: Cardiovascular;  Laterality: N/A;     Current Outpatient Medications  Medication Sig Dispense Refill   acetaminophen (TYLENOL) 325 MG tablet Take 1-2 tablets (325-650 mg total) by mouth every 4 (four) hours as needed for mild pain.     amiodarone (PACERONE) 200 MG  tablet Take 100 mg by mouth daily.     aspirin EC 81 MG tablet Take 81 mg by mouth every other day. Swallow whole.     atorvastatin (LIPITOR) 80 MG tablet Take 1 tablet (80 mg total) by mouth daily. 90 tablet 3   B Complex-C (SUPER B COMPLEX PO) Take 1 tablet by mouth daily. Unknown strength     diclofenac Sodium (VOLTAREN) 1 % GEL Apply 4 g topically 4 (four) times daily. (Patient taking differently: Apply 4 g topically 4 (four) times daily. Using as needed) 100 g 0   fluticasone (FLONASE) 50 MCG/ACT nasal spray Place 1-2 sprays into both nostrils at bedtime.  2   isosorbide dinitrate (ISORDIL) 30 MG tablet Take 1 tablet (30 mg total) by mouth 2 (two) times daily. 180 tablet 2   melatonin 5 MG TABS Take 1 tablet (5 mg total) by mouth at bedtime. 30 tablet 0   Multiple Vitamin (MULTIVITAMIN WITH MINERALS) TABS tablet Take 1 tablet by mouth daily.     senna-docusate (SENOKOT-S) 8.6-50 MG tablet  Take 1 tablet by mouth at bedtime as needed for mild constipation.     sodium bicarbonate 650 MG tablet Take 650 mg by mouth 2 (two) times daily.     metoprolol succinate (TOPROL XL) 25 MG 24 hr tablet Take 1 tablet (25 mg total) by mouth daily. 90 tablet 3   No current facility-administered medications for this visit.    Allergies:   Hydralazine and Mexiletine   Social History:  The patient  reports that he quit smoking about 54 years ago. His smoking use included cigarettes. He has a 2.50 pack-year smoking history. He has never used smokeless tobacco. He reports current alcohol use. He reports that he does not use drugs.   Family History:  The patient's family history includes Brain cancer in his sister; Diabetes in his mother; Leukemia in his brother; Melanoma in his brother.   ROS:  Please see the history of present illness.   Otherwise, review of systems is positive for none.   All other systems are reviewed and negative.   PHYSICAL EXAM: VS:  BP 100/72   Pulse (!) 58   Ht '6\' 1"'$  (1.854 m)   Wt  215 lb 1.3 oz (97.6 kg)   SpO2 93%   BMI 28.38 kg/m  , BMI Body mass index is 28.38 kg/m. GEN: Well nourished, well developed, in no acute distress  HEENT: normal  Neck: no JVD, carotid bruits, or masses Cardiac: RRR; no murmurs, rubs, or gallops,no edema  Respiratory:  clear to auscultation bilaterally, normal work of breathing GI: soft, nontender, nondistended, + BS MS: no deformity or atrophy  Skin: warm and dry, device site well healed Neuro:  Strength and sensation are intact Psych: euthymic mood, full affect  EKG:  EKG is not ordered today. Personal review of the ekg ordered 09/28/22 shows A sense, V pace  Personal review of the device interrogation today. Results in [place]: 01/25/2022: Magnesium 2.0 04/08/2022: NT-Pro BNP 3,220 05/01/2022: Hemoglobin 10.8; Platelets 278 08/24/2022: ALT 19; BUN 21; Creatinine, Ser 2.15; Potassium 4.6; Sodium 144; TSH 4.620    Lipid Panel     Component Value Date/Time   CHOL 84 04/13/2021 0317   CHOL 247 (H) 06/01/2020 0858   TRIG 201 (H) 04/13/2021 0317   HDL 26 (L) 04/13/2021 0317   HDL 33 (L) 06/01/2020 0858   CHOLHDL 3.2 04/13/2021 0317   VLDL 40 04/13/2021 0317   LDLCALC 18 04/13/2021 0317   LDLCALC 151 (H) 06/01/2020 0858     Wt Readings from Last 3 Encounters:  10/03/22 215 lb 1.3 oz (97.6 kg)  09/28/22 215 lb 1.3 oz (97.6 kg)  08/24/22 219 lb 9.6 oz (99.6 kg)      Other studies Reviewed: Additional studies/ records that were reviewed today include: [facility] 04/12/21  Review of the above records today demonstrates:  Severe single-vessel coronary artery disease with multifocal disease of the proximal and mid LAD of up to 80% that is hemodynamically significant by RFR and IVUS criteria. Moderate LCx disease with 50% de novo stenosis proximal to stent placed in 09/2020.  There is also 40% in-stent restenosis in the LCx/OM2 stent.  These lesions are not hemodynamically significant (RFR = 0.98). Mildly elevated left  heart, right heart, and pulmonary artery pressures. Normal Fick cardiac output/index. Successful IVUS- and RFR-guided PCI to 80% mid LAD stenosis using Onyx Frontier 2.5 x 30 mm drug-eluting stent (postdilated to 3.2 mm) with 0% residual stenosis and TIMI-3 flow.   TTE  05/24/22  1. Left ventricular ejection fraction, by estimation, is 30 to 35%. The  left ventricle has moderately decreased function. The left ventricle has  no regional wall motion abnormalities. Left ventricular diastolic  parameters are consistent with Grade I  diastolic dysfunction (impaired relaxation).   2. Right ventricular systolic function is mildly reduced. The right  ventricular size is normal. There is normal pulmonary artery systolic  pressure.   3. Left atrial size was mildly dilated.   4. The mitral valve is degenerative. Mild to moderate mitral valve  regurgitation. No evidence of mitral stenosis.   5. The aortic valve is normal in structure. Aortic valve regurgitation is  mild. Aortic valve sclerosis is present, with no evidence of aortic valve  stenosis.   6. There is moderate dilatation of the ascending aorta, measuring 44 mm.   7. The inferior vena cava is normal in size with greater than 50%  respiratory variability, suggesting right atrial pressure of 3 mmHg.   ASSESSMENT AND PLAN:  1.  Chronic systolic heart failure: Due to ischemic cardiomyopathy.  Most recent echo with ejection fraction of 30 to 35%.  Status post Medtronic CRT-P implanted 11/03/2021.  Currently on optimal medical therapy.  Device function appropriately.  He is having dizziness upon standing.  Due to that, we Spruha Weight stop his metoprolol.  2.  Coronary artery disease: Status post circumflex and LAD stents.  No current chest pain.  Plan per primary cardiology.  3.  PVCs: Was having a low burden of biventricular pacing.  Currently on amiodarone, dose above.  Now biventricular pacing 98%.  4.  High risk medication monitoring: Currently on  amiodarone for PVCs as above.  Labs have remained stable.   Current medicines are reviewed at length with the patient today.   The patient does not have concerns regarding his medicines.  The following changes were made today: Stop metoprolol  Labs/ tests ordered today include:  Orders Placed This Encounter  Procedures   CUP [place]     Disposition:   FU 12 months  Signed, Angell Pincock Meredith Leeds, MD  10/03/2022 2:38 PM     [facility] [address] [place] [address] [phone] (office) [phone] (fax)

## 2022-10-03 NOTE — Patient Instructions (Addendum)
Medication Instructions:  Your physician has recommended you make the following change in your medication:  STOP Metoprolol  *If you need a refill on your cardiac medications before your next appointment, please call your pharmacy*   Lab Work: None ordered If you have labs (blood work) drawn today and your tests are completely normal, you will receive your results only by: [place] (if you have MyChart) OR A paper copy in the mail If you have any lab test that is abnormal or we need to change your treatment, we will call you to review the results.   Testing/Procedures: None ordered   Follow-Up: At [facility], you and your health needs are our priority.  As part of our continuing mission to provide you with exceptional heart care, we have created designated Provider Care Teams.  These Care Teams include your primary Cardiologist (physician) and Advanced Practice Providers (APPs -  Physician Assistants and Nurse Practitioners) who all work together to provide you with the care you need, when you need it.  Remote monitoring is used to monitor your Pacemaker or ICD from home. This monitoring reduces the number of office visits required to check your device to one time per year. It allows [place] to keep an eye on the functioning of your device to ensure it is working properly. You are scheduled for a device check from home on 11/08/2022. You may send your transmission at any time that day. If you have a wireless device, the transmission will be sent automatically. After your physician reviews your transmission, you will receive a postcard with your next transmission date.  Your next appointment:   1 year(s)  The format for your next appointment:   In Person  Provider:   Allegra Lai, MD    Thank you for choosing [facility]!!   Trinidad Curet, RN [phone]    Other Instructions

## 2022-10-03 NOTE — Addendum Note (Signed)
Addended by: Stanton Kidney on: 10/03/2022 02:52 PM   Modules accepted: Orders

## 2022-10-04 ENCOUNTER — Encounter: Payer: Self-pay | Admitting: Physician Assistant

## 2022-10-04 ENCOUNTER — Ambulatory Visit (INDEPENDENT_AMBULATORY_CARE_PROVIDER_SITE_OTHER): Payer: Medicare Other | Admitting: Physician Assistant

## 2022-10-04 VITALS — BP 121/72 | HR 58 | Resp 20 | Ht 73.0 in | Wt 215.0 lb

## 2022-10-04 DIAGNOSIS — I255 Ischemic cardiomyopathy: Secondary | ICD-10-CM

## 2022-10-04 DIAGNOSIS — I7121 Aneurysm of the ascending aorta, without rupture: Secondary | ICD-10-CM | POA: Diagnosis not present

## 2022-10-04 LAB — CBC
Hematocrit: 37.6 % (ref 37.5–51.0)
Hemoglobin: 12.2 g/dL — ABNORMAL LOW (ref 13.0–17.7)
MCH: 29.4 pg (ref 26.6–33.0)
MCHC: 32.4 g/dL (ref 31.5–35.7)
MCV: 91 fL (ref 79–97)
Platelets: 268 10*3/uL (ref 150–450)
RBC: 4.15 x10E6/uL (ref 4.14–5.80)
RDW: 14 % (ref 11.6–15.4)
WBC: 7.1 10*3/uL (ref 3.4–10.8)

## 2022-10-04 LAB — AST: AST: 24 IU/L (ref 0–40)

## 2022-10-04 LAB — LIPID PANEL
Chol/HDL Ratio: 3.8 ratio (ref 0.0–5.0)
Cholesterol, Total: 118 mg/dL (ref 100–199)
HDL: 31 mg/dL — ABNORMAL LOW (ref >39)
LDL Chol Calc (NIH): 52 mg/dL (ref 0–99)
Triglycerides: 218 mg/dL — ABNORMAL HIGH (ref 0–149)
VLDL Cholesterol Cal: 35 mg/dL (ref 5–40)

## 2022-10-04 LAB — ALT: ALT: 20 IU/L (ref 0–44)

## 2022-10-04 NOTE — Progress Notes (Addendum)
[address]       [place], [address]             [phone]    PCP is Cari Caraway, MD Referring Provider is [name], MD  Chief Complaint: Ascending thoracic aortic aneurysm   HPI: This is a 77 year old with a past medical history of hypertension, anoxic brain injury, CKD (stage III-IV), CAD, DM, benign brain tumor, ischemic cardiomyopathy, hyperlipidemia, NSVT, PE, remote tobacco abuse, HFrEF who we have been following since 2022 for an ascending thoracic aortic aneurysm. Patient denies chest  pain, shortness of breath, LE edema.  Past Medical History:  Diagnosis Date   Acute blood loss anemia 10/05/2020   Acute respiratory failure ([place]), hypothermia therapy, vent - extubated 10/06/20    AKI (acute kidney injury) ([place]) 10/05/2020   Anoxic brain injury ([place]) 10/26/2020   Arrhythmia    Ascending aortic aneurysm ([place]) 01/29/2018   a. 2019 4.3 cm by echo; b. 02/2021 Echo: Ao root 72m.   Benign brain tumor ([place] [place]    Benign neoplasm of brain ([facility] 12/19/2017   Cardiac arrest with ventricular fibrillation ([facility]    Chest pain 12/04/2017   Chronic kidney disease, stage III (moderate) ([place] 12/19/2017   CKD (chronic kidney disease), stage III - IV (HCC)    Coronary Artery Disease    a. 17/3220Inf STEMI complicated by cardiac arrest/PCI: LAD 50p/m, LCX 100d (2.5 x 30 Resolute Onyx DES); b. 04/2021 PCI: 04/2021 LM nl, LAD 50p/86m2.5x30 Onyx Frontier DES), LCX 25p, 502mFR 0.98), 40d ISR (RFR 0.98), OM2 25, RCA mild diff dzs.   Diabetes mellitus, type 2 ([facility] [place]/25/2022   10/06/20 A1C 7%   Dizziness 10/11/2019   Encounter for imaging study to confirm orogastric (OG) tube placement    Hematuria 10/26/2020   HFrEF (heart failure with reduced ejection fraction) ([facility]  a. 11/2020 Echo: EF 25-30%; b. 02/2021 Echo: EF 25-30%, glob HK. Mild LVH. Nl RV fxn. Triv AI. Ao root 7m66m History of pulmonary embolus (PE)    HLD (hyperlipidemia) 10/26/2020    Hypertension    Iron deficiency anemia rec'd IV iron 10/26/2020   Ischemic cardiomyopathy    a. 11/2020 Echo: EF 25-30%; b. 02/2021 Echo: EF 25-30%, glob HK.   Left bundle branch block 12/19/2017   Meningioma (HCC)[place]/11/2019   Nonsustained ventricular tachycardia (HCC)[place]/29/2021   Pain of left hip joint 05/29/2020   Personal history of pulmonary embolism 12/19/2017   Pneumonia of both lungs due to infectious organism    Pulmonary hypertension due to thromboembolism (HCC)[place]/28/2019   See echo 12/27/17 with nl PAS vs CTa chest 02/23/18    S/P angioplasty with stent 10/04/20 DES to LCX  10/26/2020   Sinus bradycardia 12/22/2017   SOB (shortness of breath)    Solitary pulmonary nodule on lung CT 03/08/2018   CT 12/04/17 1.0 x 0.8 x 0.7 cm nodular opacity in the RUL vs not seen 03/16/10 posterior segment of the right upper lobe. .Spi[place] Kitchenometry 03/08/2018    FEV1 3.86 (108%)  Ratio 96 s prior rx  - PET  03/13/18   Low grade c/w adenoca > rec T surgery eval     STEMI (ST elevation myocardial infarction) (HCC)[place]/23/2022   Urinary retention 10/26/2020   Vestibular schwannoma (HCC)[place]/11/2019    Past Surgical History:  Procedure Laterality Date   APPENDECTOMY     BIV ICD INSERTION CRT-D N/A 11/03/2021  Procedure: BIV ICD INSERTION CRT-D;  Surgeon: Constance Haw, MD;  Location: [facility];  Service: Cardiovascular;  Laterality: N/A;   CARDIAC CATHETERIZATION     CORONARY ANGIOPLASTY WITH STENT PLACEMENT Left 04/12/2021   LAD stent placement   CORONARY STENT INTERVENTION N/A 04/12/2021   Procedure: CORONARY STENT INTERVENTION;  Surgeon: Nelva Bush, MD;  Location: [facility];  Service: Cardiovascular;  Laterality: N/A;   CORONARY/GRAFT ACUTE MI REVASCULARIZATION N/A 10/04/2020   Procedure: Coronary/Graft Acute MI Revascularization;  Surgeon: Nelva Bush, MD;  Location: [facility];  Service: Cardiovascular;  Laterality: N/A;   HERNIA REPAIR     INTRAVASCULAR  PRESSURE WIRE/FFR STUDY N/A 04/12/2021   Procedure: INTRAVASCULAR PRESSURE WIRE/FFR STUDY;  Surgeon: Nelva Bush, MD;  Location: [facility];  Service: Cardiovascular;  Laterality: N/A;   INTRAVASCULAR ULTRASOUND/IVUS N/A 04/12/2021   Procedure: Intravascular Ultrasound/IVUS;  Surgeon: Nelva Bush, MD;  Location: [facility];  Service: Cardiovascular;  Laterality: N/A;   LEFT HEART CATH AND CORONARY ANGIOGRAPHY N/A 10/04/2020   Procedure: LEFT HEART CATH AND CORONARY ANGIOGRAPHY;  Surgeon: Nelva Bush, MD;  Location: [facility];  Service: Cardiovascular;  Laterality: N/A;   RIGHT/LEFT HEART CATH AND CORONARY ANGIOGRAPHY N/A 04/12/2021   Procedure: RIGHT/LEFT HEART CATH AND CORONARY ANGIOGRAPHY;  Surgeon: Nelva Bush, MD;  Location: [facility];  Service: Cardiovascular;  Laterality: N/A;    Family History  Problem Relation Age of Onset   Diabetes Mother    Brain cancer Sister    Melanoma Brother        Radiation Exposure   Leukemia Brother        Agent Orange    Social History Social History   Tobacco Use   Smoking status: Former    Packs/day: 0.50    Years: 5.00    Total pack years: 2.50    Types: Cigarettes    Quit date: 09/12/1968    Years since quitting: 54.0   Smokeless tobacco: Never  Vaping Use   Vaping Use: Never used  Substance Use Topics   Alcohol use: Yes    Comment: ONE PER WEEK   Drug use: Never    Current Outpatient Medications  Medication Sig Dispense Refill   acetaminophen (TYLENOL) 325 MG tablet Take 1-2 tablets (325-650 mg total) by mouth every 4 (four) hours as needed for mild pain.     amiodarone (PACERONE) 200 MG tablet Take 100 mg by mouth daily.     aspirin EC 81 MG tablet Take 81 mg by mouth every other day. Swallow whole.     atorvastatin (LIPITOR) 80 MG tablet Take 1 tablet (80 mg total) by mouth daily. 90 tablet 3   B Complex-C (SUPER B COMPLEX PO) Take 1 tablet by mouth daily. Unknown strength      diclofenac Sodium (VOLTAREN) 1 % GEL Apply 4 g topically 4 (four) times daily. (Patient taking differently: Apply 4 g topically 4 (four) times daily. Using as needed) 100 g 0   fluticasone (FLONASE) 50 MCG/ACT nasal spray Place 1-2 sprays into both nostrils at bedtime.  2   isosorbide dinitrate (ISORDIL) 30 MG tablet Take 1 tablet (30 mg total) by mouth 2 (two) times daily. 180 tablet 2   melatonin 5 MG TABS Take 1 tablet (5 mg total) by mouth at bedtime. 30 tablet 0   Multiple Vitamin (MULTIVITAMIN WITH MINERALS) TABS tablet Take 1 tablet by mouth daily.     senna-docusate (  SENOKOT-S) 8.6-50 MG tablet Take 1 tablet by mouth at bedtime as needed for mild constipation.     sodium bicarbonate 650 MG tablet Take 650 mg by mouth 2 (two) times daily.      Allergies  Allergen Reactions   Hydralazine     dizziness   Mexiletine     Dizziness    Review of Systems: Chest Pain [ N ] Resting SOB [ N] Exertional SOB [ N ]  Pedal Edema [ N ] Syncope [ N ] Presyncope [ N ]  General Review of Systems: [Y] = yes [ N]=no  Consitutional:   nausea [ N];  fever [ N];  Eye : Amaurosis fugax[ N ];  Resp: cough [ N];  hemoptysis[ N];  GI: vomiting[N ]; melena[ N]; hematochezia [N];  JE:HUDJSHFWY[ N]; Heme/Lymph: anemia[Y-has a history ];  Neuro: TIA[ N];stroke[ N];  seizures[ N];  Endocrine: diabetes[N ];   Vital Signs: Vitals:   10/04/22 1332  BP: 121/72  Pulse: (!) 58  Resp: 20  SpO2: 96%     Physical Exam: CV-RRR, no murmur Pulmonary-Clear to auscultation bilaterally Neck-Soft, no carotid bruit Abdomen-Soft, non tender, bowel sounds present Extremities-No LE edema Neurologic-Grossly intact without focal deficit  Diagnostic Tests:  Narrative & Impression  CLINICAL DATA:  Ascending aortic aneurysm   EXAM: CT CHEST WITHOUT CONTRAST   TECHNIQUE: Multidetector CT imaging of the chest was performed following the standard protocol without IV contrast.   RADIATION DOSE REDUCTION: This  exam was performed according to the departmental dose-optimization program which includes automated exposure control, adjustment of the mA and/or kV according to patient size and/or use of iterative reconstruction technique.   COMPARISON:  01/22/2022   FINDINGS: Cardiovascular: Unchanged enlargement of the tubular ascending thoracic aorta measuring up to 4.3 x 4.3 cm. Although not well evaluated by noncontrast CT, the aortic valve measures approximately 3.5 cm in caliber, the sinuses of Valsalva 4.1 cm. Normal caliber of the aortic arch and descending thoracic aorta measuring up to 2.9 x 2.9 cm. Scattered aortic atherosclerosis. Cardiomegaly. Three-vessel coronary artery calcifications and or stents. Left chest multi lead pacer. No pericardial effusion.   Mediastinum/Nodes: No enlarged mediastinal, hilar, or axillary lymph nodes. Thyroid gland, trachea, and esophagus demonstrate no significant findings.   Lungs/Pleura: Unchanged small, chronic, loculated right pleural effusion and associated rounded atelectasis. Diffuse bilateral bronchial wall thickening. Unchanged 0.9 x 0.5 cm nodule of the posterior right upper lobe, benign, for which no further follow-up or characterization is required (series 5, image 47).   Upper Abdomen: No acute abnormality.   Musculoskeletal: No chest wall abnormality. No acute osseous findings. Unchanged, calcified lesion of the right aspect of the spinal canal at the level of T8, previously characterized as a meningioma (series 2, image 92). Disc degenerative disease and bridging osteophytosis of the included cervical and thoracic spine, in keeping with DISH.   IMPRESSION: 1. Unchanged enlargement of the tubular ascending thoracic aorta measuring up to 4.3 x 4.3 cm. Recommend annual imaging followup by CTA or MRA. This recommendation follows 2010 ACCF/AHA/AATS/ACR/ASA/SCA/SCAI/SIR/STS/SVM Guidelines for the Diagnosis and Management of Patients with  Thoracic Aortic Disease. Circulation. 2010; 121: O378-H885. Aortic aneurysm NOS (ICD10-I71.9) 2. Unchanged small, chronic, loculated right pleural effusion and associated rounded atelectasis. 3. Diffuse bilateral bronchial wall thickening, consistent with nonspecific infectious or inflammatory bronchitis. 4. Cardiomegaly and coronary artery disease. 5. Unchanged, calcified lesion of the right aspect of the spinal canal at the level of T8, previously characterized as a meningioma.  Aortic Atherosclerosis (ICD10-I70.0).     Electronically Signed   By: Delanna Ahmadi M.D.   On: 09/28/2022 10:38    Impression and Plan: We reviewed the results of the CT of the chest (4.3 cm ascending thoracic aortic aneurysm) and that he does not require surgical intervention. In addition, he has an unchanged 0.9 x 0.5 cm nodule of the posterior right upper lobe as well as an unchanged, calcified lesion of the right aspect of the spinal canal at the level of T8, previously characterized as a meningioma. He was seen by Dr. Ileana Ladd yesterday (he has CRT-P since February 2023). Because of dizziness, Lopressor was stopped. We reviewed risk modifications for ATAA (I.e. BP control,statin use etc).He will return in one year with another CT of the chest for further surveillance.  Nani Skillern, PA-C [facility] [phone]

## 2022-10-04 NOTE — Patient Instructions (Signed)
Risk Modification in those with ascending thoracic aortic aneurysm:  Continue good control of blood pressure (prefer SBP 130/80 or less)-continue Isordil  2. Avoid fluoroquinolone antibiotics (I.e Ciprofloxacin, Avelox, Levofloxacin, Ofloxacin)  3.  Use of statin (to decrease cardiovascular risk)-continue Atorvastatin. Per most recent lipid profile, Toal cholesterol 118, Triglycerides 218, HDL 31, and LDL 52  4.  Exercise and activity limitations is individualized, but in general, contact sports are to be  avoided and one should avoid heavy lifting (defined as half of ideal body weight) and exercises involving sustained Valsalva maneuver.  5. Counseling for those suspected of having genetically mediated disease. First-degree relatives of those with TAA disease should be screened as well as those who have a connective tissue disease (I.e with Marfan syndrome, Ehlers-Danlos syndrome,  and Loeys-Dietz syndrome) or a  bicuspid aortic valve,have an increased risk for  complications related to TAA. Echo done 05/24/2022 showed aortic valve normal in structure, mild AI. The aforementioned does not apply to this patient  6. He has a remote history of tobacco abuse (quit 1970)

## 2022-10-06 ENCOUNTER — Other Ambulatory Visit: Payer: Self-pay | Admitting: *Deleted

## 2022-10-06 ENCOUNTER — Encounter: Payer: Self-pay | Admitting: Physical Therapy

## 2022-10-06 ENCOUNTER — Ambulatory Visit: Payer: Medicare Other | Admitting: Physical Therapy

## 2022-10-06 DIAGNOSIS — R296 Repeated falls: Secondary | ICD-10-CM | POA: Diagnosis not present

## 2022-10-06 DIAGNOSIS — R278 Other lack of coordination: Secondary | ICD-10-CM | POA: Diagnosis not present

## 2022-10-06 DIAGNOSIS — R2681 Unsteadiness on feet: Secondary | ICD-10-CM

## 2022-10-06 DIAGNOSIS — M6281 Muscle weakness (generalized): Secondary | ICD-10-CM

## 2022-10-06 DIAGNOSIS — R531 Weakness: Secondary | ICD-10-CM

## 2022-10-06 DIAGNOSIS — D329 Benign neoplasm of meninges, unspecified: Secondary | ICD-10-CM | POA: Diagnosis not present

## 2022-10-06 DIAGNOSIS — Z79899 Other long term (current) drug therapy: Secondary | ICD-10-CM

## 2022-10-06 NOTE — Therapy (Signed)
OUTPATIENT PHYSICAL THERAPY THORACOLUMBAR TREATMENT   Patient Name: Timothy Moran MRN: [id number] DOB:05-25-1946, 77 y.o., male Today's Date: 10/06/2022   PT End of Session - 10/06/22 1359     Visit Number 5    Date for PT Re-Evaluation 12/20/22    PT Start Time 1355    PT Stop Time 9767    PT Time Calculation (min) 43 min    Equipment Utilized During Treatment Gait belt    Activity Tolerance Patient tolerated treatment well    Behavior During Therapy [facility] for tasks assessed/performed              Past Medical History:  Diagnosis Date   Acute blood loss anemia 10/05/2020   Acute respiratory failure ([place]), hypothermia therapy, vent - extubated 10/06/20    AKI (acute kidney injury) ([place]) 10/05/2020   Anoxic brain injury ([place]) 10/26/2020   Arrhythmia    Ascending aortic aneurysm ([place]) 01/29/2018   a. 2019 4.3 cm by echo; b. 02/2021 Echo: Ao root 29m.   Benign brain tumor ([place]    Benign neoplasm of brain ([place] 12/19/2017   Cardiac arrest with ventricular fibrillation ([place]    Chest pain 12/04/2017   Chronic kidney disease, stage III (moderate) ([place] 12/19/2017   CKD (chronic kidney disease), stage III - IV (HCC)    Coronary Artery Disease    a. 13/4193Inf STEMI complicated by cardiac arrest/PCI: LAD 50p/m, LCX 100d (2.5 x 30 Resolute Onyx DES); b. 04/2021 PCI: 04/2021 LM nl, LAD 50p/831m2.5x30 Onyx Frontier DES), LCX 25p, 5064mFR 0.98), 40d ISR (RFR 0.98), OM2 25, RCA mild diff dzs.   Diabetes mellitus, type 2 ([place]/25/2022   10/06/20 A1C 7%   Dizziness 10/11/2019   Encounter for imaging study to confirm orogastric (OG) tube placement    Hematuria 10/26/2020   HFrEF (heart failure with reduced ejection fraction) ([place]CCWelcome  a. 11/2020 Echo: EF 25-30%; b. 02/2021 Echo: EF 25-30%, glob HK. Mild LVH. Nl RV fxn. Triv AI. Ao root 44m16m History of pulmonary embolus (PE)    HLD (hyperlipidemia) 10/26/2020   Hypertension    Iron deficiency anemia rec'd IV iron 10/26/2020    Ischemic cardiomyopathy    a. 11/2020 Echo: EF 25-30%; b. 02/2021 Echo: EF 25-30%, glob HK.   Left bundle branch block 12/19/2017   Meningioma (HCC)[place]/11/2019   Nonsustained ventricular tachycardia (HCC)[place]/29/2021   Pain of left hip joint 05/29/2020   Personal history of pulmonary embolism 12/19/2017   Pneumonia of both lungs due to infectious organism    Pulmonary hypertension due to thromboembolism (HCC)[place]/28/2019   See echo 12/27/17 with nl PAS vs CTa chest 02/23/18    S/P angioplasty with stent 10/04/20 DES to LCX  10/26/2020   Sinus bradycardia 12/22/2017   SOB (shortness of breath)    Solitary pulmonary nodule on lung CT 03/08/2018   CT 12/04/17 1.0 x 0.8 x 0.7 cm nodular opacity in the RUL vs not seen 03/16/10 posterior segment of the right upper lobe. .Spi[place] Kitchenometry 03/08/2018    FEV1 3.86 (108%)  Ratio 96 s prior rx  - PET  03/13/18   Low grade c/w adenoca > rec T surgery eval     STEMI (ST elevation myocardial infarction) (HCC)[place]/23/2022   Urinary retention 10/26/2020   Vestibular schwannoma (HCC)[place]/11/2019   Past Surgical History:  Procedure Laterality Date   APPENDECTOMY     BIV ICD INSERTION CRT-D N/A 11/03/2021   Procedure: BIV ICD INSERTION  CRT-D;  Surgeon: Constance Haw, MD;  Location: [place] [facility];  Service: Cardiovascular;  Laterality: N/A;   CARDIAC CATHETERIZATION     CORONARY ANGIOPLASTY WITH STENT PLACEMENT Left 04/12/2021   LAD stent placement   CORONARY STENT INTERVENTION N/A 04/12/2021   Procedure: CORONARY STENT INTERVENTION;  Surgeon: Nelva Bush, MD;  Location: [place] [facility];  Service: Cardiovascular;  Laterality: N/A;   CORONARY/GRAFT ACUTE MI REVASCULARIZATION N/A 10/04/2020   Procedure: Coronary/Graft Acute MI Revascularization;  Surgeon: Nelva Bush, MD;  Location: [place] [facility];  Service: Cardiovascular;  Laterality: N/A;   HERNIA REPAIR     INTRAVASCULAR PRESSURE WIRE/FFR STUDY N/A 04/12/2021   Procedure: INTRAVASCULAR PRESSURE  WIRE/FFR STUDY;  Surgeon: Nelva Bush, MD;  Location: [place] [facility];  Service: Cardiovascular;  Laterality: N/A;   INTRAVASCULAR ULTRASOUND/IVUS N/A 04/12/2021   Procedure: Intravascular Ultrasound/IVUS;  Surgeon: Nelva Bush, MD;  Location: [place] [facility];  Service: Cardiovascular;  Laterality: N/A;   LEFT HEART CATH AND CORONARY ANGIOGRAPHY N/A 10/04/2020   Procedure: LEFT HEART CATH AND CORONARY ANGIOGRAPHY;  Surgeon: Nelva Bush, MD;  Location: [place] [facility];  Service: Cardiovascular;  Laterality: N/A;   RIGHT/LEFT HEART CATH AND CORONARY ANGIOGRAPHY N/A 04/12/2021   Procedure: RIGHT/LEFT HEART CATH AND CORONARY ANGIOGRAPHY;  Surgeon: Nelva Bush, MD;  Location: [place] [facility];  Service: Cardiovascular;  Laterality: N/A;   Patient Active Problem List   Diagnosis Date Noted   Anemia    Leukocytosis    Right arm weakness 01/25/2022   Right sided weakness    Fall at home 01/22/2022   Status post biventricular pacemaker - MRI compatible PPM and leads 01/22/2022   Chronic indwelling Foley catheter 01/22/2022   CKD (chronic kidney disease), stage III - IV ([place]) 06/28/2021   Chronic HFrEF (heart failure with reduced ejection fraction) ([place]) 03/31/2021   Arrhythmia    DM (diabetes mellitus), type 2 ([place]) new 10/26/2020   Urinary retention 10/26/2020   Hyperlipidemia LDL goal <70 10/26/2020   Coronary artery disease of native artery of native heart with stable angina pectoris ([place]) 10/26/2020   S/P angioplasty with stent 10/04/20 DES to LCX  10/26/2020   Ischemic cardiomyopathy 10/26/2020   Diabetes mellitus, type 2 ([place]) 10/06/2020   DOE (dyspnea on exertion)    Meningioma ([employer]) 08/14/2020   Vestibular schwannoma ([place]) 08/14/2020   Cardiac arrest with ventricular fibrillation (HCC)    Pain of left hip joint 05/29/2020   Benign brain tumor ([place])    Chronic kidney disease    History of pulmonary embolus (PE)    Hypertension    Nonsustained ventricular  tachycardia ([place]) 10/11/2019   Dizziness 10/11/2019   Abnormal echocardiogram 06/07/2019   Pulmonary hypertension due to thromboembolism ([place]) 03/09/2018   Solitary pulmonary nodule on lung CT 03/08/2018   Ascending aortic aneurysm (HCC) 4.2 cm based on CT from December 2020 01/29/2018   Sinus bradycardia 12/22/2017   Left bundle branch block 12/19/2017   Personal history of pulmonary embolism - no longer on anticoagulation due to pt refusal to take it anymore. 12/19/2017   Chronic kidney disease, stage III (moderate) ([place]) 12/19/2017   Benign neoplasm of brain ([place]) 12/19/2017    PCP: Cari Caraway, MD   REFERRING PROVIDER: Cari Caraway, MD   REFERRING DIAG: Falls, unsteady gait, mm weakness Rationale for Evaluation and Treatment Rehabilitation  THERAPY DIAG:  Unsteadiness on feet  Right sided weakness  Other lack of coordination  Repeated falls  Meningioma (  [place])  Muscle weakness (generalized)  ONSET DATE: 05/10/2022   SUBJECTIVE:                                                                                                                                                                                           SUBJECTIVE STATEMENT: Reports that he tolerated last treatment better. His Dr stopped his one medication for BP that was causing dizziness and reports somewhat improved stability in standing.  PERTINENT HISTORY:  Brief HPI:   Jerrald Doverspike is a 77 y.o. male who fell backward striking his head and right neck shoulder area on 01/21/2022. He complained of right hemiparesis. CT head revealed left parafalcine meningioma.Started on Decadron with improvement in symptoms   PAIN:  Are you having pain? no PRECAUTIONS: ICD/Pacemaker  WEIGHT BEARING RESTRICTIONS No  FALLS:  Has patient fallen in last 6 months? Yes. Number of falls 2  LIVING ENVIRONMENT: Lives with: lives with their spouse Lives in: House/apartment Stairs: Yes: External: 7 steps; bilateral  but cannot reach both Has following equipment at home: Walker - 2 wheeled and a cane, shower seat  OCCUPATION: Retired  PLOF: Independent  PATIENT GOALS Feel normal with standing and functioning.  Feel stronger and have better balance   OBJECTIVE:   DIAGNOSTIC FINDINGS:  CT Lumbar- 3. Moderate degenerative spinal stenosis at L2-3. CT CERVICAL IMPRESSION: 1. No evidence of intracranial injury. 2. Negative for cervical spine fracture but there is prevertebral edema at C4-5 suggesting soft tissue injury. 3. Meningiomas with left cerebral of edema, reference brain MRI 01/22/2022.  SCREENING FOR RED FLAGS: Bowel or bladder incontinence: No Spinal tumors: No Cauda equina syndrome: No Compression fracture: No Abdominal aneurysm: Yes: Abdominal aortic aneurysm, stable  COGNITION:  Overall cognitive status: WFL     SENSATION: WFL Patient reports tingling in B hands, all over.  MUSCLE LENGTH: Hamstrings: approximately 55 degrees B.  Was 70 degrees in September 2023  POSTURE: rounded shoulders and forward head  PALPATION: No TTP in R shoulder or up traps. B up traps tight, low back TTP R and L proximal to Illiac crest  LUMBAR ROM:  AROM limited in all directions, but he lost his balance frequently, so accuracy of testing limited.   LOWER EXTREMITY ROM:      General mild stiffness throughout BLE with PROM.  LOWER EXTREMITY MMT:   MMT Right eval Left eval  Hip flexion 4- 4-  Hip extension    Hip abduction 4 4  Hip adduction    Hip internal rotation    Hip external rotation    Knee flexion 4+ 4-  Knee extension 4+ 4-  Ankle dorsiflexion 4 4  Ankle plantarflexion    Ankle inversion    Ankle eversion     (Blank rows = not tested)  LUMBAR SPECIAL TESTS:  Slump test: Positive R  Shoulder Special Tests empty can test (+) R, Hawkins-Kennedy test (+) R. Previous MRI reveals full tear of supraspinatus and partial tear of infraspinatus.  FUNCTIONAL TESTS:  5 times  sit to stand: 30 seconds struggled with weak knees Timed up and go (TUG): 27 seconds with SPC Functional gait assessment: not tested BERG balance test:  23/56  GAIT: Distance walked: 80 Assistive device utilized: SPC, very unsteady used walls to stabilize, knees very shaky and unsteady, needing CGA to min A Level of assistance: CGA to min A for LOB Comments: Patient was unsteady with turns, relied on UE support for balance., legs shaky, presents like he has neuropathy but he does not, also reports double vision    TODAY'S TREATMENT  10/06/22 NuStep L4 x 6 minutes B side step onto/off air ex at rail for needed UE support Stand with R leg on air ex, rotate to the L and back, with occasional UE support, repeat x 10 each direction. SLS lifting opp leg up and over cone on floor, required UUE support, 12 reps each leg Crossover stepping in parallel bars 3 x each direction, BUE support Back crossover in parallel bars x 3 with BUE support Gait with 3# weights in each hand, 120' with a U turn in the middle, improved stability noted Floor ladder work. Stepping in all directions upon therapist command, CGA, 1 episode of balance loss Walk through floor ladder with weights, then without. Mild unsteadiness either way, poor coordination. Shoulder ext with 15#, 10 reps Paloff press 10 reps, 15# each direction.  09/29/22 Nustep level 4 x 6 minutes Bike Level 3 x 6 minutes On foot on airex standing balance Alternating 4" toe touches with using 1 and 2 canes In pbars side stepping walking FWD/BKWD Bend over and touch cone, difficulty coming up. Head turns while standing  09/27/22 Nustep level 5 x 6 mintues Leg curls 20# single legs each 2x10 Leg extenison single legs 5# each leg 2x10 In pbars, on airex balance beam side stepping and tandem walking Volleyball with back of knees touching the mat table and then without Back to wall but away from it doing butt touches and shoulder blade touches to  the wall working on hip and ankle strategies Sit to stand with and without weighted ball Direction changes Leg press 20# 2x10 Walking ball toss  09/22/22 Bike L4 x 6 minutes Supine SLR x 10 with each leg Bridge with abd resistance at knees with g tband, 10 reps Alternating march with pelvic tilt, 10 reps each. Attempted sit to stand from mat, but too challenging. Mini squats with BUE support for balance, x 10 Standing on Air ex pad, forward and back weight shifts, occasional min A for balance Alternating step taps on 4" step, then placed one foot on step. Standing rotation, no UE support Occasional unsteadiness, but no LOB noted. Quick side to side steps x 60 sec. Therapist providing consistent VC to keep the speed up  Mildly unsteady, but no LOB.   PATIENT EDUCATION:  Education details: POC,  Person educated: Patient and Spouse Education method: Explanation Education comprehension: verbalized understanding   HOME EXERCISE PROGRAM: [id number]   ASSESSMENT:  CLINICAL IMPRESSION: Patient continues to be very unsteady. Addressed using a [place] Rile previously and patient was not open to the idea.  Brought it up again, recommend he use a cane outside of the house. Patient does feel the need at this time. Treatment continued to emphasize balance training, speed of movement, coordination. He would like to reach to floor at home from stand if he drops something. Will assess his safety with this on next visit.  OBJECTIVE IMPAIRMENTS Abnormal gait, decreased activity tolerance, decreased balance, decreased cognition, decreased coordination, difficulty walking, decreased ROM, decreased strength, increased muscle spasms, impaired flexibility, postural dysfunction, and pain.   ACTIVITY LIMITATIONS carrying, lifting, bending, standing, squatting, stairs, reach over head, and locomotion level  PARTICIPATION LIMITATIONS: cleaning, laundry, driving, shopping, and community activity  PERSONAL FACTORS  Age and 1 comorbidity: multiple meningiomas  are also affecting patient's functional outcome.   REHAB POTENTIAL: Good  CLINICAL DECISION MAKING: Evolving/moderate complexity  EVALUATION COMPLEXITY: Moderate   GOALS: Goals reviewed with patient? Yes  SHORT TERM GOALS: Target date: 10/04/22  I with initial HEP Baseline: Goal status: ongoing  LONG TERM GOALS: Target date: 12/20/22  I with final HEP Baseline:  Goal status: INITIAL  2.  Patient will have no falls over a 6 week period Baseline: 2 falls  Goal status: ongoing  3.  Complete 5 x STS in <12 sec to demonstrate improved LE strength Baseline: 30 seconds Goal status: ongoing  4.  Patient will complete TUG in < 12 sec with or without AD Baseline: 27 seconds Goal status: INITIAL  5.  Patient will score at least 43/56 on Berg balance Baseline: 23/56 Goal status: INITIAL  6.  Patient will walk at least 600' with LRAD, MI on level an unlevel surfaces, back pain < 3/10 Baseline: 80', unsteady, SPC with CGA/Min A Goal status: INITIAL   PLAN: PT FREQUENCY: 2x/week  PT DURATION: 12 weeks  PLANNED INTERVENTIONS: Therapeutic exercises, Therapeutic activity, Neuromuscular re-education, Balance training, Gait training, Patient/Family education, Self Care, Joint mobilization, Stair training, Dry Needling, Electrical stimulation, Cryotherapy, Moist heat, Ultrasound, Ionotophoresis '4mg'$ /ml Dexamethasone, and Manual therapy.  PLAN FOR NEXT SESSION: work on balance and strength. 10/06/22 Practice reaching to the floor.  Ethel Rana DPT 10/06/22 2:41 PM

## 2022-10-07 ENCOUNTER — Telehealth: Payer: Self-pay

## 2022-10-07 NOTE — Telephone Encounter (Signed)
-----  Message from Park Liter, MD sent at 10/07/2022  8:51 AM EST ----- Chem-7 looks good, continue present management

## 2022-10-07 NOTE — Telephone Encounter (Signed)
Patient notified of results through my chart.

## 2022-10-10 ENCOUNTER — Ambulatory Visit: Payer: Medicare Other | Admitting: Physical Therapy

## 2022-10-10 ENCOUNTER — Encounter: Payer: Self-pay | Admitting: Physical Therapy

## 2022-10-10 DIAGNOSIS — R2681 Unsteadiness on feet: Secondary | ICD-10-CM

## 2022-10-10 DIAGNOSIS — R296 Repeated falls: Secondary | ICD-10-CM | POA: Diagnosis not present

## 2022-10-10 DIAGNOSIS — D329 Benign neoplasm of meninges, unspecified: Secondary | ICD-10-CM | POA: Diagnosis not present

## 2022-10-10 DIAGNOSIS — R531 Weakness: Secondary | ICD-10-CM

## 2022-10-10 DIAGNOSIS — M6281 Muscle weakness (generalized): Secondary | ICD-10-CM | POA: Diagnosis not present

## 2022-10-10 DIAGNOSIS — R278 Other lack of coordination: Secondary | ICD-10-CM | POA: Diagnosis not present

## 2022-10-10 NOTE — Therapy (Signed)
OUTPATIENT PHYSICAL THERAPY THORACOLUMBAR TREATMENT   Patient Name: Timothy Moran MRN: [id number] DOB:04-01-1946, 77 y.o., male Today's Date: 10/10/2022   PT End of Session - 10/10/22 1520     Visit Number 6    Date for PT Re-Evaluation 12/20/22    Authorization Type Medicare    PT Start Time 1520    PT Stop Time 1610    PT Time Calculation (min) 50 min    Equipment Utilized During Treatment Gait belt    Activity Tolerance Patient tolerated treatment well    Behavior During Therapy WFL for tasks assessed/performed              Past Medical History:  Diagnosis Date   Acute blood loss anemia 10/05/2020   Acute respiratory failure ([place]), hypothermia therapy, vent - extubated 10/06/20    AKI (acute kidney injury) ([place]) 10/05/2020   Anoxic brain injury ([place]) 10/26/2020   Arrhythmia    Ascending aortic aneurysm ([place]) 01/29/2018   a. 2019 4.3 cm by echo; b. 02/2021 Echo: Ao root 60m.   Benign brain tumor ([place]    Benign neoplasm of brain ([facility] 12/19/2017   Cardiac arrest with ventricular fibrillation ([facility]    Chest pain 12/04/2017   Chronic kidney disease, stage III (moderate) ([facility] 12/19/2017   CKD (chronic kidney disease), stage III - IV (HCC)    Coronary Artery Disease    a. 12/9476Inf STEMI complicated by cardiac arrest/PCI: LAD 50p/m, LCX 100d (2.5 x 30 Resolute Onyx DES); b. 04/2021 PCI: 04/2021 LM nl, LAD 50p/889m2.5x30 Onyx Frontier DES), LCX 25p, 5075mFR 0.98), 40d ISR (RFR 0.98), OM2 25, RCA mild diff dzs.   Diabetes mellitus, type 2 ([facility]1/25/2022   10/06/20 A1C 7%   Dizziness 10/11/2019   Encounter for imaging study to confirm orogastric (OG) tube placement    Hematuria 10/26/2020   HFrEF (heart failure with reduced ejection fraction) ([facility]  a. 11/2020 Echo: EF 25-30%; b. 02/2021 Echo: EF 25-30%, glob HK. Mild LVH. Nl RV fxn. Triv AI. Ao root 85m27m History of pulmonary embolus (PE)    HLD (hyperlipidemia) 10/26/2020   Hypertension    Iron deficiency  anemia rec'd IV iron 10/26/2020   Ischemic cardiomyopathy    a. 11/2020 Echo: EF 25-30%; b. 02/2021 Echo: EF 25-30%, glob HK.   Left bundle branch block 12/19/2017   Meningioma (HCC)[place]/11/2019   Nonsustained ventricular tachycardia (HCC)[place]/29/2021   Pain of left hip joint 05/29/2020   Personal history of pulmonary embolism 12/19/2017   Pneumonia of both lungs due to infectious organism    Pulmonary hypertension due to thromboembolism (HCC)[place]/28/2019   See echo 12/27/17 with nl PAS vs CTa chest 02/23/18    S/P angioplasty with stent 10/04/20 DES to LCX  10/26/2020   Sinus bradycardia 12/22/2017   SOB (shortness of breath)    Solitary pulmonary nodule on lung CT 03/08/2018   CT 12/04/17 1.0 x 0.8 x 0.7 cm nodular opacity in the RUL vs not seen 03/16/10 posterior segment of the right upper lobe. .Spi[place] Kitchenometry 03/08/2018    FEV1 3.86 (108%)  Ratio 96 s prior rx  - PET  03/13/18   Low grade c/w adenoca > rec T surgery eval     STEMI (ST elevation myocardial infarction) (HCC)[place]/23/2022   Urinary retention 10/26/2020   Vestibular schwannoma (HCC)[place]/11/2019   Past Surgical History:  Procedure Laterality Date   APPENDECTOMY     BIV ICD INSERTION CRT-D N/A 11/03/2021  Procedure: BIV ICD INSERTION CRT-D;  Surgeon: Constance Haw, MD;  Location: [facility];  Service: Cardiovascular;  Laterality: N/A;   CARDIAC CATHETERIZATION     CORONARY ANGIOPLASTY WITH STENT PLACEMENT Left 04/12/2021   LAD stent placement   CORONARY STENT INTERVENTION N/A 04/12/2021   Procedure: CORONARY STENT INTERVENTION;  Surgeon: Nelva Bush, MD;  Location: [facility];  Service: Cardiovascular;  Laterality: N/A;   CORONARY/GRAFT ACUTE MI REVASCULARIZATION N/A 10/04/2020   Procedure: Coronary/Graft Acute MI Revascularization;  Surgeon: Nelva Bush, MD;  Location: [facility];  Service: Cardiovascular;  Laterality: N/A;   HERNIA REPAIR     INTRAVASCULAR PRESSURE WIRE/FFR STUDY N/A 04/12/2021    Procedure: INTRAVASCULAR PRESSURE WIRE/FFR STUDY;  Surgeon: Nelva Bush, MD;  Location: [facility];  Service: Cardiovascular;  Laterality: N/A;   INTRAVASCULAR ULTRASOUND/IVUS N/A 04/12/2021   Procedure: Intravascular Ultrasound/IVUS;  Surgeon: Nelva Bush, MD;  Location: [facility];  Service: Cardiovascular;  Laterality: N/A;   LEFT HEART CATH AND CORONARY ANGIOGRAPHY N/A 10/04/2020   Procedure: LEFT HEART CATH AND CORONARY ANGIOGRAPHY;  Surgeon: Nelva Bush, MD;  Location: [facility];  Service: Cardiovascular;  Laterality: N/A;   RIGHT/LEFT HEART CATH AND CORONARY ANGIOGRAPHY N/A 04/12/2021   Procedure: RIGHT/LEFT HEART CATH AND CORONARY ANGIOGRAPHY;  Surgeon: Nelva Bush, MD;  Location: [facility];  Service: Cardiovascular;  Laterality: N/A;   Patient Active Problem List   Diagnosis Date Noted   Anemia    Leukocytosis    Right arm weakness 01/25/2022   Right sided weakness    Fall at home 01/22/2022   Status post biventricular pacemaker - MRI compatible PPM and leads 01/22/2022   Chronic indwelling Foley catheter 01/22/2022   CKD (chronic kidney disease), stage III - IV ([place]) 06/28/2021   Chronic HFrEF (heart failure with reduced ejection fraction) ([place]) 03/31/2021   Arrhythmia    DM (diabetes mellitus), type 2 ([place]) new 10/26/2020   Urinary retention 10/26/2020   Hyperlipidemia LDL goal <70 10/26/2020   Coronary artery disease of native artery of native heart with stable angina pectoris ([place]) 10/26/2020   S/P angioplasty with stent 10/04/20 DES to LCX  10/26/2020   Ischemic cardiomyopathy 10/26/2020   Diabetes mellitus, type 2 ([place]) 10/06/2020   DOE (dyspnea on exertion)    Meningioma ([place]) 08/14/2020   Vestibular schwannoma ([place]) 08/14/2020   Cardiac arrest with ventricular fibrillation (HCC)    Pain of left hip joint 05/29/2020   Benign brain tumor ([place])    Chronic kidney disease    History of pulmonary embolus (PE)     Hypertension    Nonsustained ventricular tachycardia ([place]) 10/11/2019   Dizziness 10/11/2019   Abnormal echocardiogram 06/07/2019   Pulmonary hypertension due to thromboembolism ([place]) 03/09/2018   Solitary pulmonary nodule on lung CT 03/08/2018   Ascending aortic aneurysm (HCC) 4.2 cm based on CT from December 2020 01/29/2018   Sinus bradycardia 12/22/2017   Left bundle branch block 12/19/2017   Personal history of pulmonary embolism - no longer on anticoagulation due to pt refusal to take it anymore. 12/19/2017   Chronic kidney disease, stage III (moderate) ([place]) 12/19/2017   Benign neoplasm of brain ([place]) 12/19/2017    PCP: Cari Caraway, MD   REFERRING PROVIDER: Cari Caraway, MD   REFERRING DIAG: Falls, unsteady gait, mm weakness Rationale for Evaluation and Treatment Rehabilitation  THERAPY DIAG:  Unsteadiness on feet  Right sided weakness  Other lack of coordination  Repeated falls  ONSET DATE: 05/10/2022   SUBJECTIVE:                                                                                                                                                                                           SUBJECTIVE STATEMENT: Reports that he tolerated last treatment better. He does report that since the dropping of one of the medicines he is less dizzy, still at times unsteady  PERTINENT HISTORY:  Brief HPI:   Yug Loria is a 77 y.o. male who fell backward striking his head and right neck shoulder area on 01/21/2022. He complained of right hemiparesis. CT head revealed left parafalcine meningioma.Started on Decadron with improvement in symptoms   PAIN:  Are you having pain? no PRECAUTIONS: ICD/Pacemaker  WEIGHT BEARING RESTRICTIONS No  FALLS:  Has patient fallen in last 6 months? Yes. Number of falls 2  LIVING ENVIRONMENT: Lives with: lives with their spouse Lives in: House/apartment Stairs: Yes: External: 7 steps; bilateral but cannot reach both Has  following equipment at home: Walker - 2 wheeled and a cane, shower seat  OCCUPATION: Retired  PLOF: Independent  PATIENT GOALS Feel normal with standing and functioning.  Feel stronger and have better balance   OBJECTIVE:   DIAGNOSTIC FINDINGS:  CT Lumbar- 3. Moderate degenerative spinal stenosis at L2-3. CT CERVICAL IMPRESSION: 1. No evidence of intracranial injury. 2. Negative for cervical spine fracture but there is prevertebral edema at C4-5 suggesting soft tissue injury. 3. Meningiomas with left cerebral of edema, reference brain MRI 01/22/2022.  SCREENING FOR RED FLAGS: Bowel or bladder incontinence: No Spinal tumors: No Cauda equina syndrome: No Compression fracture: No Abdominal aneurysm: Yes: Abdominal aortic aneurysm, stable  COGNITION:  Overall cognitive status: WFL     SENSATION: WFL Patient reports tingling in B hands, all over.  MUSCLE LENGTH: Hamstrings: approximately 55 degrees B.  Was 70 degrees in September 2023  POSTURE: rounded shoulders and forward head  PALPATION: No TTP in R shoulder or up traps. B up traps tight, low back TTP R and L proximal to Illiac crest  LUMBAR ROM:  AROM limited in all directions, but he lost his balance frequently, so accuracy of testing limited.   LOWER EXTREMITY ROM:      General mild stiffness throughout BLE with PROM.  LOWER EXTREMITY MMT:   MMT Right eval Left eval  Hip flexion 4- 4-  Hip extension    Hip abduction 4 4  Hip adduction    Hip internal rotation    Hip external rotation    Knee flexion 4+ 4-  Knee extension 4+ 4-  Ankle dorsiflexion  4 4  Ankle plantarflexion    Ankle inversion    Ankle eversion     (Blank rows = not tested)  LUMBAR SPECIAL TESTS:  Slump test: Positive R  Shoulder Special Tests empty can test (+) R, Hawkins-Kennedy test (+) R. Previous MRI reveals full tear of supraspinatus and partial tear of infraspinatus.  FUNCTIONAL TESTS:  5 times sit to stand: 30 seconds  struggled with weak knees Timed up and go (TUG): 27 seconds with SPC Functional gait assessment: not tested BERG balance test:  23/56  GAIT: Distance walked: 80 Assistive device utilized: SPC, very unsteady used walls to stabilize, knees very shaky and unsteady, needing CGA to min A Level of assistance: CGA to min A for LOB Comments: Patient was unsteady with turns, relied on UE support for balance., legs shaky, presents like he has neuropathy but he does not, also reports double vision    TODAY'S TREATMENT  10/10/22 Nustep level 5 x 6 minutes Standing ball toss Cone reach side to side, then with a step Cone reach with full front to back with a steo Sit to stand from plinth on airex Cone pick ups from low to high Tried power moves with stepping and rotation difficult Back to wall butt and scap touches  Front to wall belly and head touches High knee marches Direction changes Side step reaches  10/06/22 NuStep L4 x 6 minutes B side step onto/off air ex at rail for needed UE support Stand with R leg on air ex, rotate to the L and back, with occasional UE support, repeat x 10 each direction. SLS lifting opp leg up and over cone on floor, required UUE support, 12 reps each leg Crossover stepping in parallel bars 3 x each direction, BUE support Back crossover in parallel bars x 3 with BUE support Gait with 3# weights in each hand, 120' with a U turn in the middle, improved stability noted Floor ladder work. Stepping in all directions upon therapist command, CGA, 1 episode of balance loss Walk through floor ladder with weights, then without. Mild unsteadiness either way, poor coordination. Shoulder ext with 15#, 10 reps Paloff press 10 reps, 15# each direction.  09/29/22 Nustep level 4 x 6 minutes Bike Level 3 x 6 minutes On foot on airex standing balance Alternating 4" toe touches with using 1 and 2 canes In pbars side stepping walking FWD/BKWD Bend over and touch cone,  difficulty coming up. Head turns while standing  09/27/22 Nustep level 5 x 6 mintues Leg curls 20# single legs each 2x10 Leg extenison single legs 5# each leg 2x10 In pbars, on airex balance beam side stepping and tandem walking Volleyball with back of knees touching the mat table and then without Back to wall but away from it doing butt touches and shoulder blade touches to the wall working on hip and ankle strategies Sit to stand with and without weighted ball Direction changes Leg press 20# 2x10 Walking ball toss  09/22/22 Bike L4 x 6 minutes Supine SLR x 10 with each leg Bridge with abd resistance at knees with g tband, 10 reps Alternating march with pelvic tilt, 10 reps each. Attempted sit to stand from mat, but too challenging. Mini squats with BUE support for balance, x 10 Standing on Air ex pad, forward and back weight shifts, occasional min A for balance Alternating step taps on 4" step, then placed one foot on step. Standing rotation, no UE support Occasional unsteadiness, but no LOB noted. Quick side  to side steps x 60 sec. Therapist providing consistent VC to keep the speed up  Mildly unsteady, but no LOB.   PATIENT EDUCATION:  Education details: POC,  Person educated: Patient and Spouse Education method: Explanation Education comprehension: verbalized understanding   HOME EXERCISE PROGRAM: [id number]   ASSESSMENT:  CLINICAL IMPRESSION: Patient continues to be very unsteady. He isa little better at times with movements during balance and seems to have the most difficulty with standing still balance.  He seems as if he has neuropathy as he goes heel toe uncontrolled.  I did ask him to call around to get his eyes checked as he is talking about double vision he did let me know the neurologist knows about the double vision.  He had a lot of difficulty with the side stepping PWR moves.  Seemed to have good hip strategies and step strategies but I do have gaitbelt and at  times CGA, but he will usually arrest the LOB on his own.  OBJECTIVE IMPAIRMENTS Abnormal gait, decreased activity tolerance, decreased balance, decreased cognition, decreased coordination, difficulty walking, decreased ROM, decreased strength, increased muscle spasms, impaired flexibility, postural dysfunction, and pain.   ACTIVITY LIMITATIONS carrying, lifting, bending, standing, squatting, stairs, reach over head, and locomotion level  PARTICIPATION LIMITATIONS: cleaning, laundry, driving, shopping, and community activity  PERSONAL FACTORS Age and 1 comorbidity: multiple meningiomas  are also affecting patient's functional outcome.   REHAB POTENTIAL: Good  CLINICAL DECISION MAKING: Evolving/moderate complexity  EVALUATION COMPLEXITY: Moderate   GOALS: Goals reviewed with patient? Yes  SHORT TERM GOALS: Target date: 10/04/22  I with initial HEP Baseline: Goal status: ongoing  LONG TERM GOALS: Target date: 12/20/22  I with final HEP Baseline:  Goal status: ongoing  2.  Patient will have no falls over a 6 week period Baseline: 2 falls  Goal status: ongoing  3.  Complete 5 x STS in <12 sec to demonstrate improved LE strength Baseline: 30 seconds Goal status: ongoing  4.  Patient will complete TUG in < 12 sec with or without AD Baseline: 27 seconds Goal status: INITIAL  5.  Patient will score at least 43/56 on Berg balance Baseline: 23/56 Goal status: INITIAL  6.  Patient will walk at least 600' with LRAD, MI on level an unlevel surfaces, back pain < 3/10 Baseline: 80', unsteady, SPC with CGA/Min A Goal status: INITIAL   PLAN: PT FREQUENCY: 2x/week  PT DURATION: 12 weeks  PLANNED INTERVENTIONS: Therapeutic exercises, Therapeutic activity, Neuromuscular re-education, Balance training, Gait training, Patient/Family education, Self Care, Joint mobilization, Stair training, Dry Needling, Electrical stimulation, Cryotherapy, Moist heat, Ultrasound, Ionotophoresis  '4mg'$ /ml Dexamethasone, and Manual therapy.  PLAN FOR NEXT SESSION: work on balance and strength. 10/06/22 Practice reaching to the floor.  Lum Babe, PT 10/10/22 3:21 PM

## 2022-10-12 ENCOUNTER — Ambulatory Visit: Payer: Medicare Other | Admitting: Physical Therapy

## 2022-10-12 ENCOUNTER — Encounter: Payer: Self-pay | Admitting: Physical Therapy

## 2022-10-12 DIAGNOSIS — M6281 Muscle weakness (generalized): Secondary | ICD-10-CM | POA: Diagnosis not present

## 2022-10-12 DIAGNOSIS — R2681 Unsteadiness on feet: Secondary | ICD-10-CM | POA: Diagnosis not present

## 2022-10-12 DIAGNOSIS — R278 Other lack of coordination: Secondary | ICD-10-CM | POA: Diagnosis not present

## 2022-10-12 DIAGNOSIS — R296 Repeated falls: Secondary | ICD-10-CM

## 2022-10-12 DIAGNOSIS — R531 Weakness: Secondary | ICD-10-CM | POA: Diagnosis not present

## 2022-10-12 DIAGNOSIS — D329 Benign neoplasm of meninges, unspecified: Secondary | ICD-10-CM | POA: Diagnosis not present

## 2022-10-12 NOTE — Therapy (Signed)
OUTPATIENT PHYSICAL THERAPY THORACOLUMBAR TREATMENT   Patient Name: Timothy Moran MRN: [id number] DOB:04/21/1946, 77 y.o., male Today's Date: 10/12/2022   PT End of Session - 10/12/22 1359     Visit Number 7    Date for PT Re-Evaluation 12/20/22    Authorization Type Medicare    PT Start Time 1355    PT Stop Time 1440    PT Time Calculation (min) 45 min    Activity Tolerance Patient tolerated treatment well    Behavior During Therapy [facility] for tasks assessed/performed              Past Medical History:  Diagnosis Date   Acute blood loss anemia 10/05/2020   Acute respiratory failure ([place]), hypothermia therapy, vent - extubated 10/06/20    AKI (acute kidney injury) ([place]) 10/05/2020   Anoxic brain injury ([place]) 10/26/2020   Arrhythmia    Ascending aortic aneurysm ([place]) 01/29/2018   a. 2019 4.3 cm by echo; b. 02/2021 Echo: Ao root 38m.   Benign brain tumor ([place]    Benign neoplasm of brain ([place] 12/19/2017   Cardiac arrest with ventricular fibrillation ([place]    Chest pain 12/04/2017   Chronic kidney disease, stage III (moderate) ([place] 12/19/2017   CKD (chronic kidney disease), stage III - IV (HCC)    Coronary Artery Disease    a. 15/3299Inf STEMI complicated by cardiac arrest/PCI: LAD 50p/m, LCX 100d (2.5 x 30 Resolute Onyx DES); b. 04/2021 PCI: 04/2021 LM nl, LAD 50p/874m2.5x30 Onyx Frontier DES), LCX 25p, 5038mFR 0.98), 40d ISR (RFR 0.98), OM2 25, RCA mild diff dzs.   Diabetes mellitus, type 2 ([place]/25/2022   10/06/20 A1C 7%   Dizziness 10/11/2019   Encounter for imaging study to confirm orogastric (OG) tube placement    Hematuria 10/26/2020   HFrEF (heart failure with reduced ejection fraction) ([place]  a. 11/2020 Echo: EF 25-30%; b. 02/2021 Echo: EF 25-30%, glob HK. Mild LVH. Nl RV fxn. Triv AI. Ao root 64m52m History of pulmonary embolus (PE)    HLD (hyperlipidemia) 10/26/2020   Hypertension    Iron deficiency anemia rec'd IV iron 10/26/2020   Ischemic  cardiomyopathy    a. 11/2020 Echo: EF 25-30%; b. 02/2021 Echo: EF 25-30%, glob HK.   Left bundle branch block 12/19/2017   Meningioma (HCC)[place]/11/2019   Nonsustained ventricular tachycardia (HCC)[place]/29/2021   Pain of left hip joint 05/29/2020   Personal history of pulmonary embolism 12/19/2017   Pneumonia of both lungs due to infectious organism    Pulmonary hypertension due to thromboembolism (HCC)[place]/28/2019   See echo 12/27/17 with nl PAS vs CTa chest 02/23/18    S/P angioplasty with stent 10/04/20 DES to LCX  10/26/2020   Sinus bradycardia 12/22/2017   SOB (shortness of breath)    Solitary pulmonary nodule on lung CT 03/08/2018   CT 12/04/17 1.0 x 0.8 x 0.7 cm nodular opacity in the RUL vs not seen 03/16/10 posterior segment of the right upper lobe. .Spi[place] Kitchenometry 03/08/2018    FEV1 3.86 (108%)  Ratio 96 s prior rx  - PET  03/13/18   Low grade c/w adenoca > rec T surgery eval     STEMI (ST elevation myocardial infarction) (HCC)[employer]/23/2022   Urinary retention 10/26/2020   Vestibular schwannoma (HCC)[place]/11/2019   Past Surgical History:  Procedure Laterality Date   APPENDECTOMY     BIV ICD INSERTION CRT-D N/A 11/03/2021   Procedure: BIV ICD INSERTION CRT-D;  Surgeon:  Constance Haw, MD;  Location: [place] [facility];  Service: Cardiovascular;  Laterality: N/A;   CARDIAC CATHETERIZATION     CORONARY ANGIOPLASTY WITH STENT PLACEMENT Left 04/12/2021   LAD stent placement   CORONARY STENT INTERVENTION N/A 04/12/2021   Procedure: CORONARY STENT INTERVENTION;  Surgeon: Nelva Bush, MD;  Location: [place] CV LAB;  Service: Cardiovascular;  Laterality: N/A;   CORONARY/GRAFT ACUTE MI REVASCULARIZATION N/A 10/04/2020   Procedure: Coronary/Graft Acute MI Revascularization;  Surgeon: Nelva Bush, MD;  Location: [place] CV LAB;  Service: Cardiovascular;  Laterality: N/A;   HERNIA REPAIR     INTRAVASCULAR PRESSURE WIRE/FFR STUDY N/A 04/12/2021   Procedure: INTRAVASCULAR PRESSURE WIRE/FFR  STUDY;  Surgeon: Nelva Bush, MD;  Location: [place] CV LAB;  Service: Cardiovascular;  Laterality: N/A;   INTRAVASCULAR ULTRASOUND/IVUS N/A 04/12/2021   Procedure: Intravascular Ultrasound/IVUS;  Surgeon: Nelva Bush, MD;  Location: [place] CV LAB;  Service: Cardiovascular;  Laterality: N/A;   LEFT HEART CATH AND CORONARY ANGIOGRAPHY N/A 10/04/2020   Procedure: LEFT HEART CATH AND CORONARY ANGIOGRAPHY;  Surgeon: Nelva Bush, MD;  Location: [place] CV LAB;  Service: Cardiovascular;  Laterality: N/A;   RIGHT/LEFT HEART CATH AND CORONARY ANGIOGRAPHY N/A 04/12/2021   Procedure: RIGHT/LEFT HEART CATH AND CORONARY ANGIOGRAPHY;  Surgeon: Nelva Bush, MD;  Location: [place] [facility] LAB;  Service: Cardiovascular;  Laterality: N/A;   Patient Active Problem List   Diagnosis Date Noted   Anemia    Leukocytosis    Right arm weakness 01/25/2022   Right sided weakness    Fall at home 01/22/2022   Status post biventricular pacemaker - MRI compatible PPM and leads 01/22/2022   Chronic indwelling Foley catheter 01/22/2022   CKD (chronic kidney disease), stage III - IV ([place]) 06/28/2021   Chronic HFrEF (heart failure with reduced ejection fraction) ([place]) 03/31/2021   Arrhythmia    DM (diabetes mellitus), type 2 ([place]) new 10/26/2020   Urinary retention 10/26/2020   Hyperlipidemia LDL goal <70 10/26/2020   Coronary artery disease of native artery of native heart with stable angina pectoris ([place]) 10/26/2020   S/P angioplasty with stent 10/04/20 DES to LCX  10/26/2020   Ischemic cardiomyopathy 10/26/2020   Diabetes mellitus, type 2 ([place]) 10/06/2020   DOE (dyspnea on exertion)    Meningioma ([place]) 08/14/2020   Vestibular schwannoma ([place]) 08/14/2020   Cardiac arrest with ventricular fibrillation (HCC)    Pain of left hip joint 05/29/2020   Benign brain tumor ([place])    Chronic kidney disease    History of pulmonary embolus (PE)    Hypertension    Nonsustained ventricular tachycardia  ([place]) 10/11/2019   Dizziness 10/11/2019   Abnormal echocardiogram 06/07/2019   Pulmonary hypertension due to thromboembolism ([place]) 03/09/2018   Solitary pulmonary nodule on lung CT 03/08/2018   Ascending aortic aneurysm (HCC) 4.2 cm based on CT from December 2020 01/29/2018   Sinus bradycardia 12/22/2017   Left bundle branch block 12/19/2017   Personal history of pulmonary embolism - no longer on anticoagulation due to pt refusal to take it anymore. 12/19/2017   Chronic kidney disease, stage III (moderate) ([place]) 12/19/2017   Benign neoplasm of brain ([place]) 12/19/2017    PCP: Cari Caraway, MD   REFERRING PROVIDER: Cari Caraway, MD   REFERRING DIAG: Falls, unsteady gait, mm weakness Rationale for Evaluation and Treatment Rehabilitation  THERAPY DIAG:  Unsteadiness on feet  Right sided weakness  Repeated falls  ONSET DATE: 05/10/2022   SUBJECTIVE:  SUBJECTIVE STATEMENT: Reports that he has an eye appointment tomorrow to address the dizziness  PERTINENT HISTORY:  Brief HPI:   Ziair Penson is a 77 y.o. male who fell backward striking his head and right neck shoulder area on 01/21/2022. He complained of right hemiparesis. CT head revealed left parafalcine meningioma.Started on Decadron with improvement in symptoms   PAIN:  Are you having pain? no PRECAUTIONS: ICD/Pacemaker  WEIGHT BEARING RESTRICTIONS No  FALLS:  Has patient fallen in last 6 months? Yes. Number of falls 2  LIVING ENVIRONMENT: Lives with: lives with their spouse Lives in: House/apartment Stairs: Yes: External: 7 steps; bilateral but cannot reach both Has following equipment at home: Walker - 2 wheeled and a cane, shower seat  OCCUPATION: Retired  PLOF: Independent  PATIENT GOALS Feel normal with standing and  functioning.  Feel stronger and have better balance   OBJECTIVE:   DIAGNOSTIC FINDINGS:  CT Lumbar- 3. Moderate degenerative spinal stenosis at L2-3. CT CERVICAL IMPRESSION: 1. No evidence of intracranial injury. 2. Negative for cervical spine fracture but there is prevertebral edema at C4-5 suggesting soft tissue injury. 3. Meningiomas with left cerebral of edema, reference brain MRI 01/22/2022.  SCREENING FOR RED FLAGS: Bowel or bladder incontinence: No Spinal tumors: No Cauda equina syndrome: No Compression fracture: No Abdominal aneurysm: Yes: Abdominal aortic aneurysm, stable  COGNITION:  Overall cognitive status: WFL     SENSATION: WFL Patient reports tingling in B hands, all over.  MUSCLE LENGTH: Hamstrings: approximately 55 degrees B.  Was 70 degrees in September 2023  POSTURE: rounded shoulders and forward head  PALPATION: No TTP in R shoulder or up traps. B up traps tight, low back TTP R and L proximal to Illiac crest  LUMBAR ROM:  AROM limited in all directions, but he lost his balance frequently, so accuracy of testing limited.   LOWER EXTREMITY ROM:      General mild stiffness throughout BLE with PROM.  LOWER EXTREMITY MMT:   MMT Right eval Left eval  Hip flexion 4- 4-  Hip extension    Hip abduction 4 4  Hip adduction    Hip internal rotation    Hip external rotation    Knee flexion 4+ 4-  Knee extension 4+ 4-  Ankle dorsiflexion 4 4  Ankle plantarflexion    Ankle inversion    Ankle eversion     (Blank rows = not tested)  LUMBAR SPECIAL TESTS:  Slump test: Positive R  Shoulder Special Tests empty can test (+) R, Hawkins-Kennedy test (+) R. Previous MRI reveals full tear of supraspinatus and partial tear of infraspinatus.  FUNCTIONAL TESTS:  5 times sit to stand: 30 seconds struggled with weak knees Timed up and go (TUG): 27 seconds with SPC   TUG 10/12/22 = 13 seconds without device  Functional gait assessment: not tested BERG  balance test:  23/56, 10/12/22 =29/56  GAIT: Distance walked: 80 Assistive device utilized: SPC, very unsteady used walls to stabilize, knees very shaky and unsteady, needing CGA to min A Level of assistance: CGA to min A for LOB Comments: Patient was unsteady with turns, relied on UE support for balance., legs shaky, presents like he has neuropathy but he does not, also reports double vision    TODAY'S TREATMENT  10/12/22 Nustep level 5 x 6 minutes [place] on computer tables and reaching side to side Step reach side to side with same arm and opposite [employer] TUG/BERG assessed STM to the left neck, some isometric  cervical stretches, Contract relax side bending and rotation  10/10/22 Nustep level 5 x 6 minutes Standing ball toss Cone reach side to side, then with a step Cone reach with full front to back with a steo Sit to stand from plinth on airex Cone pick ups from low to high Tried power moves with stepping and rotation difficult Back to wall butt and scap touches  Front to wall belly and head touches High knee marches Direction changes Side step reaches  10/06/22 NuStep L4 x 6 minutes B side step onto/off air ex at rail for needed UE support Stand with R leg on air ex, rotate to the L and back, with occasional UE support, repeat x 10 each direction. SLS lifting opp leg up and over cone on floor, required UUE support, 12 reps each leg Crossover stepping in parallel bars 3 x each direction, BUE support Back crossover in parallel bars x 3 with BUE support Gait with 3# weights in each hand, 120' with a U turn in the middle, improved stability noted Floor ladder work. Stepping in all directions upon therapist command, CGA, 1 episode of balance loss Walk through floor ladder with weights, then without. Mild unsteadiness either way, poor coordination. Shoulder ext with 15#, 10 reps Paloff press 10 reps, 15# each direction.  09/29/22 Nustep level 4 x 6  minutes Bike Level 3 x 6 minutes On foot on airex standing balance Alternating 4" toe touches with using 1 and 2 canes In pbars side stepping walking FWD/BKWD Bend over and touch cone, difficulty coming up. Head turns while standing  09/27/22 Nustep level 5 x 6 mintues Leg curls 20# single legs each 2x10 Leg extenison single legs 5# each leg 2x10 In pbars, on airex balance beam side stepping and tandem walking Volleyball with back of knees touching the mat table and then without Back to wall but away from it doing butt touches and shoulder blade touches to the wall working on hip and ankle strategies Sit to stand with and without weighted ball Direction changes Leg press 20# 2x10 Walking ball toss  09/22/22 Bike L4 x 6 minutes Supine SLR x 10 with each leg Bridge with abd resistance at knees with g tband, 10 reps Alternating march with pelvic tilt, 10 reps each. Attempted sit to stand from mat, but too challenging. Mini squats with BUE support for balance, x 10 Standing on Air ex pad, forward and back weight shifts, occasional min A for balance Alternating step taps on 4" step, then placed one foot on step. Standing rotation, no UE support Occasional unsteadiness, but no LOB noted. Quick side to side steps x 60 sec. Therapist providing consistent VC to keep the speed up  Mildly unsteady, but no LOB.   PATIENT EDUCATION:  Education details: POC,  Person educated: Patient and Spouse Education method: Explanation Education comprehension: verbalized understanding   HOME EXERCISE PROGRAM: Access Code: [email or web address] URL: [email or web address] Date: 10/12/2022 Prepared by: Lum Babe  Exercises - Seated Isometric Cervical Sidebending  - 1 x daily - 7 x weekly - 2 sets - 10 reps - 3 hold - Seated Isometric Cervical Rotation  - 1 x daily - 7 x weekly - 2 sets - 10 reps - 3 hold  [id number]   ASSESSMENT:  CLINICAL IMPRESSION: Patient continues to be very  unsteady with static type standing and anytime his head turns, he did better with with a step turn and once he is moving he does well, some difficulty with small spaces.  Has some neck pain with the head truns  OBJECTIVE IMPAIRMENTS Abnormal gait, decreased activity tolerance, decreased balance, decreased cognition, decreased coordination, difficulty walking, decreased ROM, decreased strength, increased muscle spasms, impaired flexibility, postural dysfunction, and pain.   ACTIVITY LIMITATIONS carrying, lifting, bending, standing, squatting, stairs, reach over head, and locomotion level  PARTICIPATION LIMITATIONS: cleaning, laundry, driving, shopping, and community activity  PERSONAL FACTORS Age and 1 comorbidity: multiple meningiomas  are also affecting patient's functional outcome.   REHAB POTENTIAL: Good  CLINICAL DECISION MAKING: Evolving/moderate complexity  EVALUATION COMPLEXITY: Moderate   GOALS: Goals reviewed with patient? Yes  SHORT TERM GOALS: Target date: 10/04/22  I with initial HEP Baseline: Goal status: ongoing  LONG TERM GOALS: Target date: 12/20/22  I with final HEP Baseline:  Goal status: ongoing  2.  Patient will have no falls over a 6 week period Baseline: 2 falls  Goal status: ongoing  3.  Complete 5 x STS in <12 sec to demonstrate improved LE strength Baseline: 30 seconds Goal status: ongoing  4.  Patient will complete TUG in < 12 sec with or without AD Baseline: 27 seconds Goal status: progressing  5.  Patient will score at least 43/56 on Berg balance Baseline: 23/56 Goal status: progressing  6.  Patient will walk at least 600' with LRAD, MI on level an unlevel surfaces, back pain < 3/10 Baseline: 80', unsteady, SPC with CGA/Min A Goal status: INITIAL   PLAN: PT FREQUENCY: 2x/week  PT DURATION: 12 weeks  PLANNED INTERVENTIONS: Therapeutic exercises, Therapeutic activity, Neuromuscular re-education, Balance training, Gait training,  Patient/Family education, Self Care, Joint mobilization, Stair training, Dry Needling, Electrical stimulation, Cryotherapy, Moist heat, Ultrasound, Ionotophoresis '4mg'$ /ml Dexamethasone, and Manual therapy.  PLAN FOR NEXT SESSION:   continue with the balance can work on the neck  Lum Babe, PT 10/12/22 2:00 PM

## 2022-10-14 ENCOUNTER — Other Ambulatory Visit: Payer: Self-pay | Admitting: *Deleted

## 2022-10-14 DIAGNOSIS — Z79899 Other long term (current) drug therapy: Secondary | ICD-10-CM

## 2022-10-17 DIAGNOSIS — Z79899 Other long term (current) drug therapy: Secondary | ICD-10-CM | POA: Diagnosis not present

## 2022-10-18 LAB — T4, FREE: Free T4: 1.21 ng/dL (ref 0.82–1.77)

## 2022-10-18 LAB — TSH: TSH: 5.4 u[IU]/mL — ABNORMAL HIGH (ref 0.450–4.500)

## 2022-10-18 LAB — T3, FREE: T3, Free: 2.2 pg/mL (ref 2.0–4.4)

## 2022-10-19 ENCOUNTER — Ambulatory Visit: Payer: Medicare Other | Attending: Family Medicine | Admitting: Physical Therapy

## 2022-10-19 ENCOUNTER — Encounter: Payer: Self-pay | Admitting: Physical Therapy

## 2022-10-19 DIAGNOSIS — D329 Benign neoplasm of meninges, unspecified: Secondary | ICD-10-CM | POA: Diagnosis not present

## 2022-10-19 DIAGNOSIS — M6281 Muscle weakness (generalized): Secondary | ICD-10-CM | POA: Insufficient documentation

## 2022-10-19 DIAGNOSIS — R296 Repeated falls: Secondary | ICD-10-CM | POA: Diagnosis not present

## 2022-10-19 DIAGNOSIS — R2681 Unsteadiness on feet: Secondary | ICD-10-CM | POA: Insufficient documentation

## 2022-10-19 DIAGNOSIS — R531 Weakness: Secondary | ICD-10-CM | POA: Insufficient documentation

## 2022-10-19 DIAGNOSIS — R278 Other lack of coordination: Secondary | ICD-10-CM | POA: Diagnosis not present

## 2022-10-19 NOTE — Therapy (Signed)
OUTPATIENT PHYSICAL THERAPY THORACOLUMBAR TREATMENT   Patient Name: Timothy Moran MRN: [id number] DOB:[birth year], 77 y.o., male Today's Date: 10/19/2022      Past Medical History:  Diagnosis Date   Acute blood loss anemia 10/05/2020   Acute respiratory failure ([place]), hypothermia therapy, vent - extubated 10/06/20    AKI (acute kidney injury) ([place]) 10/05/2020   Anoxic brain injury ([place]) 10/26/2020   Arrhythmia    Ascending aortic aneurysm ([place]) 01/29/2018   a. 2019 4.3 cm by echo; b. 02/2021 Echo: Ao root 71m.   Benign brain tumor ([place]    Benign neoplasm of brain ([facility] 12/19/2017   Cardiac arrest with ventricular fibrillation ([facility]    Chest pain 12/04/2017   Chronic kidney disease, stage III (moderate) ([facility] 12/19/2017   CKD (chronic kidney disease), stage III - IV (HCC)    Coronary Artery Disease    a. 11/2197Inf STEMI complicated by cardiac arrest/PCI: LAD 50p/m, LCX 100d (2.5 x 30 Resolute Onyx DES); b. 04/2021 PCI: 04/2021 LM nl, LAD 50p/826m2.5x30 Onyx Frontier DES), LCX 25p, 5012mFR 0.98), 40d ISR (RFR 0.98), OM2 25, RCA mild diff dzs.   Diabetes mellitus, type 2 ([facility]1/25/2022   10/06/20 A1C 7%   Dizziness 10/11/2019   Encounter for imaging study to confirm orogastric (OG) tube placement    Hematuria 10/26/2020   HFrEF (heart failure with reduced ejection fraction) ([facility]  a. 11/2020 Echo: EF 25-30%; b. 02/2021 Echo: EF 25-30%, glob HK. Mild LVH. Nl RV fxn. Triv AI. Ao root 6m51m History of pulmonary embolus (PE)    HLD (hyperlipidemia) 10/26/2020   Hypertension    Iron deficiency anemia rec'd IV iron 10/26/2020   Ischemic cardiomyopathy    a. 11/2020 Echo: EF 25-30%; b. 02/2021 Echo: EF 25-30%, glob HK.   Left bundle branch block 12/19/2017   Meningioma (HCC)[place]/11/2019   Nonsustained ventricular tachycardia (HCC)[place]/29/2021   Pain of left hip joint 05/29/2020   Personal history of pulmonary embolism 12/19/2017   Pneumonia of both lungs due to infectious  organism    Pulmonary hypertension due to thromboembolism (HCC)[place]/28/2019   See echo 12/27/17 with nl PAS vs CTa chest 02/23/18    S/P angioplasty with stent 10/04/20 DES to LCX  10/26/2020   Sinus bradycardia 12/22/2017   SOB (shortness of breath)    Solitary pulmonary nodule on lung CT 03/08/2018   CT 12/04/17 1.0 x 0.8 x 0.7 cm nodular opacity in the RUL vs not seen 03/16/10 posterior segment of the right upper lobe. .SpiMarland Kitchenometry 03/08/2018    FEV1 3.86 (108%)  Ratio 96 s prior rx  - PET  03/13/18   Low grade c/w adenoca > rec T surgery eval     STEMI (ST elevation myocardial infarction) (HCC)[place]/23/2022   Urinary retention 10/26/2020   Vestibular schwannoma (HCC)[place]/11/2019   Past Surgical History:  Procedure Laterality Date   APPENDECTOMY     BIV ICD INSERTION CRT-D N/A 11/03/2021   Procedure: BIV ICD INSERTION CRT-D;  Surgeon: CamnConstance Haw;  [facility];  Service: Cardiovascular;  Laterality: N/A;   CARDIAC CATHETERIZATION     CORONARY ANGIOPLASTY WITH STENT PLACEMENT Left 04/12/2021   LAD stent placement   CORONARY STENT INTERVENTION N/A 04/12/2021   Procedure: CORONARY STENT INTERVENTION;  Surgeon: End,Nelva Bush;  [facility];  Service: Cardiovascular;  Laterality: N/A;   CORONARY/GRAFT ACUTE MI REVASCULARIZATION N/A 10/04/2020   Procedure: Coronary/Graft Acute MI  Revascularization;  Surgeon: Nelva Bush, MD;  Location: [facility];  Service: Cardiovascular;  Laterality: N/A;   HERNIA REPAIR     INTRAVASCULAR PRESSURE WIRE/FFR STUDY N/A 04/12/2021   Procedure: INTRAVASCULAR PRESSURE WIRE/FFR STUDY;  Surgeon: Nelva Bush, MD;  Location: [facility];  Service: Cardiovascular;  Laterality: N/A;   INTRAVASCULAR ULTRASOUND/IVUS N/A 04/12/2021   Procedure: Intravascular Ultrasound/IVUS;  Surgeon: Nelva Bush, MD;  Location: [facility];  Service: Cardiovascular;  Laterality: N/A;   LEFT HEART CATH AND CORONARY  ANGIOGRAPHY N/A 10/04/2020   Procedure: LEFT HEART CATH AND CORONARY ANGIOGRAPHY;  Surgeon: Nelva Bush, MD;  Location: [facility];  Service: Cardiovascular;  Laterality: N/A;   RIGHT/LEFT HEART CATH AND CORONARY ANGIOGRAPHY N/A 04/12/2021   Procedure: RIGHT/LEFT HEART CATH AND CORONARY ANGIOGRAPHY;  Surgeon: Nelva Bush, MD;  Location: [facility];  Service: Cardiovascular;  Laterality: N/A;   Patient Active Problem List   Diagnosis Date Noted   Anemia    Leukocytosis    Right arm weakness 01/25/2022   Right sided weakness    Fall at home 01/22/2022   Status post biventricular pacemaker - MRI compatible PPM and leads 01/22/2022   Chronic indwelling Foley catheter 01/22/2022   CKD (chronic kidney disease), stage III - IV ([place]) 06/28/2021   Chronic HFrEF (heart failure with reduced ejection fraction) ([place]) 03/31/2021   Arrhythmia    DM (diabetes mellitus), type 2 ([place]) new 10/26/2020   Urinary retention 10/26/2020   Hyperlipidemia LDL goal <70 10/26/2020   Coronary artery disease of native artery of native heart with stable angina pectoris ([place]) 10/26/2020   S/P angioplasty with stent 10/04/20 DES to LCX  10/26/2020   Ischemic cardiomyopathy 10/26/2020   Diabetes mellitus, type 2 ([place]) 10/06/2020   DOE (dyspnea on exertion)    Meningioma ([place]) 08/14/2020   Vestibular schwannoma ([place]) 08/14/2020   Cardiac arrest with ventricular fibrillation (HCC)    Pain of left hip joint 05/29/2020   Benign brain tumor ([place])    Chronic kidney disease    History of pulmonary embolus (PE)    Hypertension    Nonsustained ventricular tachycardia ([place]) 10/11/2019   Dizziness 10/11/2019   Abnormal echocardiogram 06/07/2019   Pulmonary hypertension due to thromboembolism ([place]) 03/09/2018   Solitary pulmonary nodule on lung CT 03/08/2018   Ascending aortic aneurysm (HCC) 4.2 cm based on CT from December 2020 01/29/2018   Sinus bradycardia 12/22/2017   Left bundle branch block  12/19/2017   Personal history of pulmonary embolism - no longer on anticoagulation due to pt refusal to take it anymore. 12/19/2017   Chronic kidney disease, stage III (moderate) ([place]) 12/19/2017   Benign neoplasm of brain ([place]) 12/19/2017    PCP: Cari Caraway, MD   REFERRING PROVIDER: Cari Caraway, MD   REFERRING DIAG: Falls, unsteady gait, mm weakness Rationale for Evaluation and Treatment Rehabilitation  THERAPY DIAG:  No diagnosis found.  ONSET DATE: 05/10/2022   SUBJECTIVE:  SUBJECTIVE STATEMENT: Reports that he has an eye appointment tomorrow to address the dizziness  PERTINENT HISTORY:  Brief HPI:   Judah Carchi is a 77 y.o. male who fell backward striking his head and right neck shoulder area on 01/21/2022. He complained of right hemiparesis. CT head revealed left parafalcine meningioma.Started on Decadron with improvement in symptoms   PAIN:  Are you having pain? no PRECAUTIONS: ICD/Pacemaker  WEIGHT BEARING RESTRICTIONS No  FALLS:  Has patient fallen in last 6 months? Yes. Number of falls 2  LIVING ENVIRONMENT: Lives with: lives with their spouse Lives in: House/apartment Stairs: Yes: External: 7 steps; bilateral but cannot reach both Has following equipment at home: Walker - 2 wheeled and a cane, shower seat  OCCUPATION: Retired  PLOF: Independent  PATIENT GOALS Feel normal with standing and functioning.  Feel stronger and have better balance   OBJECTIVE:   DIAGNOSTIC FINDINGS:  CT Lumbar- 3. Moderate degenerative spinal stenosis at L2-3. CT CERVICAL IMPRESSION: 1. No evidence of intracranial injury. 2. Negative for cervical spine fracture but there is prevertebral edema at C4-5 suggesting soft tissue injury. 3. Meningiomas with left cerebral of edema,  reference brain MRI 01/22/2022.  SCREENING FOR RED FLAGS: Bowel or bladder incontinence: No Spinal tumors: No Cauda equina syndrome: No Compression fracture: No Abdominal aneurysm: Yes: Abdominal aortic aneurysm, stable  COGNITION:  Overall cognitive status: WFL     SENSATION: WFL Patient reports tingling in B hands, all over.  MUSCLE LENGTH: Hamstrings: approximately 55 degrees B.  Was 70 degrees in September 2023  POSTURE: rounded shoulders and forward head  PALPATION: No TTP in R shoulder or up traps. B up traps tight, low back TTP R and L proximal to Illiac crest  LUMBAR ROM:  AROM limited in all directions, but he lost his balance frequently, so accuracy of testing limited.   LOWER EXTREMITY ROM:      General mild stiffness throughout BLE with PROM.  LOWER EXTREMITY MMT:   MMT Right eval Left eval  Hip flexion 4- 4-  Hip extension    Hip abduction 4 4  Hip adduction    Hip internal rotation    Hip external rotation    Knee flexion 4+ 4-  Knee extension 4+ 4-  Ankle dorsiflexion 4 4  Ankle plantarflexion    Ankle inversion    Ankle eversion     (Blank rows = not tested)  LUMBAR SPECIAL TESTS:  Slump test: Positive R  Shoulder Special Tests empty can test (+) R, Hawkins-Kennedy test (+) R. Previous MRI reveals full tear of supraspinatus and partial tear of infraspinatus.  FUNCTIONAL TESTS:  5 times sit to stand: 30 seconds struggled with weak knees Timed up and go (TUG): 27 seconds with SPC   TUG 10/12/22 = 13 seconds without device  Functional gait assessment: not tested BERG balance test:  23/56, 10/12/22 =29/56  GAIT: Distance walked: 80 Assistive device utilized: SPC, very unsteady used walls to stabilize, knees very shaky and unsteady, needing CGA to min A Level of assistance: CGA to min A for LOB Comments: Patient was unsteady with turns, relied on UE support for balance., legs shaky, presents like he has neuropathy but he does not, also  reports double vision    TODAY'S TREATMENT  10/19/22 NuStep L5 x 6 minutes Supine chin tucks against towel, 10 x 5 sec Supine cervical rotation to each side. Had to pause and hold each time he turned to avoid dizziness Sit to stand from elevated  mat, on Air ex pad, no UE support, x 10- very challenging. Stand on upside down BOSU ball in parallel bars, balance with no UE support. Very wobbly, but able to hold balance for up to 5 seconds. SLS at side of steps, lifting 1 foot up, over 8" object, back and forth. Required UUE support, so adjusted to flat object and he was able to perform without UE support, unsteady at times, CGA Alternate taps on 4" step without UE support, CGA, unsteady, but no LOB.   10/12/22 Nustep level 5 x 6 minutes Cones on computer tables and reaching side to side Step reach side to side with same arm and opposite [employer] TUG/BERG assessed STM to the left neck, some isometric cervical stretches, Contract relax side bending and rotation  10/10/22 Nustep level 5 x 6 minutes Standing ball toss Cone reach side to side, then with a step Cone reach with full front to back with a steo Sit to stand from plinth on airex Cone pick ups from low to high Tried power moves with stepping and rotation difficult Back to wall butt and scap touches  Front to wall belly and head touches High knee marches Direction changes Side step reaches  10/06/22 NuStep L4 x 6 minutes B side step onto/off air ex at rail for needed UE support Stand with R leg on air ex, rotate to the L and back, with occasional UE support, repeat x 10 each direction. SLS lifting opp leg up and over cone on floor, required UUE support, 12 reps each leg Crossover stepping in parallel bars 3 x each direction, BUE support Back crossover in parallel bars x 3 with BUE support Gait with 3# weights in each hand, 120' with a U turn in the middle, improved stability noted Floor ladder work. Stepping in  all directions upon therapist command, CGA, 1 episode of balance loss Walk through floor ladder with weights, then without. Mild unsteadiness either way, poor coordination. Shoulder ext with 15#, 10 reps Paloff press 10 reps, 15# each direction.  09/29/22 Nustep level 4 x 6 minutes Bike Level 3 x 6 minutes On foot on airex standing balance Alternating 4" toe touches with using 1 and 2 canes In pbars side stepping walking FWD/BKWD Bend over and touch cone, difficulty coming up. Head turns while standing  09/27/22 Nustep level 5 x 6 mintues Leg curls 20# single legs each 2x10 Leg extenison single legs 5# each leg 2x10 In pbars, on airex balance beam side stepping and tandem walking Volleyball with back of knees touching the mat table and then without Back to wall but away from it doing butt touches and shoulder blade touches to the wall working on hip and ankle strategies Sit to stand with and without weighted ball Direction changes Leg press 20# 2x10 Walking ball toss  09/22/22 Bike L4 x 6 minutes Supine SLR x 10 with each leg Bridge with abd resistance at knees with g tband, 10 reps Alternating march with pelvic tilt, 10 reps each. Attempted sit to stand from mat, but too challenging. Mini squats with BUE support for balance, x 10 Standing on Air ex pad, forward and back weight shifts, occasional min A for balance Alternating step taps on 4" step, then placed one foot on step. Standing rotation, no UE support Occasional unsteadiness, but no LOB noted. Quick side to side steps x 60 sec. Therapist providing consistent VC to keep the speed up  Mildly unsteady, but no LOB.   PATIENT  EDUCATION:  Education details: POC,  Person educated: Patient and Spouse Education method: Explanation Education comprehension: verbalized understanding   HOME EXERCISE PROGRAM: Access Code: [email or web address] URL: [email or web address] Date: 10/12/2022 Prepared by: Lum Babe  Exercises - Seated Isometric Cervical Sidebending  - 1 x daily - 7 x weekly - 2 sets - 10 reps - 3 hold - Seated Isometric Cervical Rotation  - 1 x daily - 7 x weekly - 2 sets - 10 reps - 3 hold  [email or web address]   ASSESSMENT:  CLINICAL IMPRESSION: Patient reports he is most unstable upon first standing. He also reports neck pain. Initiated treatment with additional stretch and exercises for neck pain, then addressed his initial standing balance and general balance and stability. His ataxia contributes to his imbalance as does his vision. He has an eye Dr appointment on Mon to update his prism glasses. Hopefully this will correct his double vision.  OBJECTIVE IMPAIRMENTS Abnormal gait, decreased activity tolerance, decreased balance, decreased cognition, decreased coordination, difficulty walking, decreased ROM, decreased strength, increased muscle spasms, impaired flexibility, postural dysfunction, and pain.   ACTIVITY LIMITATIONS carrying, lifting, bending, standing, squatting, stairs, reach over head, and locomotion level  PARTICIPATION LIMITATIONS: cleaning, laundry, driving, shopping, and community activity  PERSONAL FACTORS Age and 1 comorbidity: multiple meningiomas  are also affecting patient's functional outcome.   REHAB POTENTIAL: Good  CLINICAL DECISION MAKING: Evolving/moderate complexity  EVALUATION COMPLEXITY: Moderate   GOALS: Goals reviewed with patient? Yes  SHORT TERM GOALS: Target date: 10/04/22  I with initial HEP Baseline: Goal status: ongoing  LONG TERM GOALS: Target date: 12/20/22  I with final HEP Baseline:  Goal status: ongoing  2.  Patient will have no falls over a 6 week period Baseline: 2 falls  Goal status: ongoing  3.  Complete 5 x STS in <12 sec to demonstrate improved LE strength Baseline: 30 seconds Goal status: ongoing  4.  Patient will complete TUG in < 12 sec with or without AD Baseline: 27 seconds Goal status: progressing  5.   Patient will score at least 43/56 on Berg balance Baseline: 23/56 Goal status: progressing  6.  Patient will walk at least 600' with LRAD, MI on level an unlevel surfaces, back pain < 3/10 Baseline: 80', unsteady, SPC with CGA/Min A Goal status: INITIAL   PLAN: PT FREQUENCY: 2x/week  PT DURATION: 12 weeks  PLANNED INTERVENTIONS: Therapeutic exercises, Therapeutic activity, Neuromuscular re-education, Balance training, Gait training, Patient/Family education, Self Care, Joint mobilization, Stair training, Dry Needling, Electrical stimulation, Cryotherapy, Moist heat, Ultrasound, Ionotophoresis '4mg'$ /ml Dexamethasone, and Manual therapy.  PLAN FOR NEXT SESSION:   continue with the balance can work on the neck  Lum Babe, PT 10/19/22 12:33 PM

## 2022-10-21 ENCOUNTER — Encounter: Payer: Self-pay | Admitting: Physical Therapy

## 2022-10-21 ENCOUNTER — Ambulatory Visit: Payer: Medicare Other | Admitting: Physical Therapy

## 2022-10-21 DIAGNOSIS — R278 Other lack of coordination: Secondary | ICD-10-CM | POA: Diagnosis not present

## 2022-10-21 DIAGNOSIS — R2681 Unsteadiness on feet: Secondary | ICD-10-CM | POA: Diagnosis not present

## 2022-10-21 DIAGNOSIS — R531 Weakness: Secondary | ICD-10-CM

## 2022-10-21 DIAGNOSIS — M6281 Muscle weakness (generalized): Secondary | ICD-10-CM | POA: Diagnosis not present

## 2022-10-21 DIAGNOSIS — R296 Repeated falls: Secondary | ICD-10-CM | POA: Diagnosis not present

## 2022-10-21 DIAGNOSIS — D329 Benign neoplasm of meninges, unspecified: Secondary | ICD-10-CM | POA: Diagnosis not present

## 2022-10-21 NOTE — Therapy (Signed)
OUTPATIENT PHYSICAL THERAPY THORACOLUMBAR TREATMENT   Patient Name: Timothy Moran MRN: [id number] DOB:19-Apr-1946, 77 y.o., male Today's Date: 10/21/2022   PT End of Session - 10/21/22 1021     Visit Number 9    Date for PT Re-Evaluation 12/20/22    PT Start Time 1018    PT Stop Time 1056    PT Time Calculation (min) 38 min    Equipment Utilized During Treatment Gait belt    Activity Tolerance Patient tolerated treatment well    Behavior During Therapy WFL for tasks assessed/performed               Past Medical History:  Diagnosis Date   Acute blood loss anemia 10/05/2020   Acute respiratory failure ([place]), hypothermia therapy, vent - extubated 10/06/20    AKI (acute kidney injury) ([place]) 10/05/2020   Anoxic brain injury ([place]) 10/26/2020   Arrhythmia    Ascending aortic aneurysm ([place]) 01/29/2018   a. 2019 4.3 cm by echo; b. 02/2021 Echo: Ao root 63m.   Benign brain tumor ([place]    Benign neoplasm of brain ([place] 12/19/2017   Cardiac arrest with ventricular fibrillation ([place]    Chest pain 12/04/2017   Chronic kidney disease, stage III (moderate) ([place] 12/19/2017   CKD (chronic kidney disease), stage III - IV (HCC)    Coronary Artery Disease    a. 10000000Inf STEMI complicated by cardiac arrest/PCI: LAD 50p/m, LCX 100d (2.5 x 30 Resolute Onyx DES); b. 04/2021 PCI: 04/2021 LM nl, LAD 50p/836m2.5x30 Onyx Frontier DES), LCX 25p, 5022mFR 0.98), 40d ISR (RFR 0.98), OM2 25, RCA mild diff dzs.   Diabetes mellitus, type 2 ([facility]/25/2022   10/06/20 A1C 7%   Dizziness 10/11/2019   Encounter for imaging study to confirm orogastric (OG) tube placement    Hematuria 10/26/2020   HFrEF (heart failure with reduced ejection fraction) ([place]  a. 11/2020 Echo: EF 25-30%; b. 02/2021 Echo: EF 25-30%, glob HK. Mild LVH. Nl RV fxn. Triv AI. Ao root 65m36m History of pulmonary embolus (PE)    HLD (hyperlipidemia) 10/26/2020   Hypertension    Iron deficiency anemia rec'd IV iron 10/26/2020    Ischemic cardiomyopathy    a. 11/2020 Echo: EF 25-30%; b. 02/2021 Echo: EF 25-30%, glob HK.   Left bundle branch block 12/19/2017   Meningioma (HCC)[place]/11/2019   Nonsustained ventricular tachycardia (HCC)[place]/29/2021   Pain of left hip joint 05/29/2020   Personal history of pulmonary embolism 12/19/2017   Pneumonia of both lungs due to infectious organism    Pulmonary hypertension due to thromboembolism (HCC)[place]/28/2019   See echo 12/27/17 with nl PAS vs CTa chest 02/23/18    S/P angioplasty with stent 10/04/20 DES to LCX  10/26/2020   Sinus bradycardia 12/22/2017   SOB (shortness of breath)    Solitary pulmonary nodule on lung CT 03/08/2018   CT 12/04/17 1.0 x 0.8 x 0.7 cm nodular opacity in the RUL vs not seen 03/16/10 posterior segment of the right upper lobe. .[place]pi[place] Kitchenometry 03/08/2018    FEV1 3.86 (108%)  Ratio 96 s prior rx  - PET  03/13/18   Low grade c/w adenoca > rec T surgery eval     STEMI (ST elevation myocardial infarction) (HCC)[place]/23/2022   Urinary retention 10/26/2020   Vestibular schwannoma (HCC)[place]/11/2019   Past Surgical History:  Procedure Laterality Date   APPENDECTOMY     BIV ICD INSERTION CRT-D N/A 11/03/2021   Procedure: BIV ICD  INSERTION CRT-D;  Surgeon: Constance Haw, MD;  Location: [facility];  Service: Cardiovascular;  Laterality: N/A;   CARDIAC CATHETERIZATION     CORONARY ANGIOPLASTY WITH STENT PLACEMENT Left 04/12/2021   LAD stent placement   CORONARY STENT INTERVENTION N/A 04/12/2021   Procedure: CORONARY STENT INTERVENTION;  Surgeon: Nelva Bush, MD;  Location: [facility];  Service: Cardiovascular;  Laterality: N/A;   CORONARY/GRAFT ACUTE MI REVASCULARIZATION N/A 10/04/2020   Procedure: Coronary/Graft Acute MI Revascularization;  Surgeon: Nelva Bush, MD;  Location: [facility];  Service: Cardiovascular;  Laterality: N/A;   HERNIA REPAIR     INTRAVASCULAR PRESSURE WIRE/FFR STUDY N/A 04/12/2021   Procedure: INTRAVASCULAR PRESSURE  WIRE/FFR STUDY;  Surgeon: Nelva Bush, MD;  Location: [facility];  Service: Cardiovascular;  Laterality: N/A;   INTRAVASCULAR ULTRASOUND/IVUS N/A 04/12/2021   Procedure: Intravascular Ultrasound/IVUS;  Surgeon: Nelva Bush, MD;  Location: [facility];  Service: Cardiovascular;  Laterality: N/A;   LEFT HEART CATH AND CORONARY ANGIOGRAPHY N/A 10/04/2020   Procedure: LEFT HEART CATH AND CORONARY ANGIOGRAPHY;  Surgeon: Nelva Bush, MD;  Location: [facility];  Service: Cardiovascular;  Laterality: N/A;   RIGHT/LEFT HEART CATH AND CORONARY ANGIOGRAPHY N/A 04/12/2021   Procedure: RIGHT/LEFT HEART CATH AND CORONARY ANGIOGRAPHY;  Surgeon: Nelva Bush, MD;  Location: [facility];  Service: Cardiovascular;  Laterality: N/A;   Patient Active Problem List   Diagnosis Date Noted   Anemia    Leukocytosis    Right arm weakness 01/25/2022   Right sided weakness    Fall at home 01/22/2022   Status post biventricular pacemaker - MRI compatible PPM and leads 01/22/2022   Chronic indwelling Foley catheter 01/22/2022   CKD (chronic kidney disease), stage III - IV ([place]) 06/28/2021   Chronic HFrEF (heart failure with reduced ejection fraction) ([place]) 03/31/2021   Arrhythmia    DM (diabetes mellitus), type 2 ([place]) new 10/26/2020   Urinary retention 10/26/2020   Hyperlipidemia LDL goal <70 10/26/2020   Coronary artery disease of native artery of native heart with stable angina pectoris ([place]) 10/26/2020   S/P angioplasty with stent 10/04/20 DES to LCX  10/26/2020   Ischemic cardiomyopathy 10/26/2020   Diabetes mellitus, type 2 ([place]) 10/06/2020   DOE (dyspnea on exertion)    Meningioma ([place]) 08/14/2020   Vestibular schwannoma ([place]) 08/14/2020   Cardiac arrest with ventricular fibrillation (HCC)    Pain of left hip joint 05/29/2020   Benign brain tumor ([place])    Chronic kidney disease    History of pulmonary embolus (PE)    Hypertension    Nonsustained ventricular  tachycardia ([place]) 10/11/2019   Dizziness 10/11/2019   Abnormal echocardiogram 06/07/2019   Pulmonary hypertension due to thromboembolism ([place]) 03/09/2018   Solitary pulmonary nodule on lung CT 03/08/2018   Ascending aortic aneurysm (HCC) 4.2 cm based on CT from December 2020 01/29/2018   Sinus bradycardia 12/22/2017   Left bundle branch block 12/19/2017   Personal history of pulmonary embolism - no longer on anticoagulation due to pt refusal to take it anymore. 12/19/2017   Chronic kidney disease, stage III (moderate) ([place]) 12/19/2017   Benign neoplasm of brain ([place]) 12/19/2017    PCP: Cari Caraway, MD   REFERRING PROVIDER: Cari Caraway, MD   REFERRING DIAG: Falls, unsteady gait, mm weakness Rationale for Evaluation and Treatment Rehabilitation  THERAPY DIAG:  Unsteadiness on feet  Right sided weakness  Repeated falls  Muscle weakness (generalized)  Meningioma (  [place])  ONSET DATE: 05/10/2022   SUBJECTIVE:                                                                                                                                                                                           SUBJECTIVE STATEMENT: Reports that he has an eye appointment tomorrow to address the dizziness  PERTINENT HISTORY:  Brief HPI:   Carl Zales is a 77 y.o. male who fell backward striking his head and right neck shoulder area on 01/21/2022. He complained of right hemiparesis. CT head revealed left parafalcine meningioma.Started on Decadron with improvement in symptoms   PAIN:  Are you having pain? no PRECAUTIONS: ICD/Pacemaker  WEIGHT BEARING RESTRICTIONS No  FALLS:  Has patient fallen in last 6 months? Yes. Number of falls 2  LIVING ENVIRONMENT: Lives with: lives with their spouse Lives in: House/apartment Stairs: Yes: External: 7 steps; bilateral but cannot reach both Has following equipment at home: Walker - 2 wheeled and a cane, shower seat  OCCUPATION:  Retired  PLOF: Independent  PATIENT GOALS Feel normal with standing and functioning.  Feel stronger and have better balance   OBJECTIVE:   DIAGNOSTIC FINDINGS:  CT Lumbar- 3. Moderate degenerative spinal stenosis at L2-3. CT CERVICAL IMPRESSION: 1. No evidence of intracranial injury. 2. Negative for cervical spine fracture but there is prevertebral edema at C4-5 suggesting soft tissue injury. 3. Meningiomas with left cerebral of edema, reference brain MRI 01/22/2022.  SCREENING FOR RED FLAGS: Bowel or bladder incontinence: No Spinal tumors: No Cauda equina syndrome: No Compression fracture: No Abdominal aneurysm: Yes: Abdominal aortic aneurysm, stable  COGNITION:  Overall cognitive status: WFL     SENSATION: WFL Patient reports tingling in B hands, all over.  MUSCLE LENGTH: Hamstrings: approximately 55 degrees B.  Was 70 degrees in September 2023  POSTURE: rounded shoulders and forward head  PALPATION: No TTP in R shoulder or up traps. B up traps tight, low back TTP R and L proximal to Illiac crest  LUMBAR ROM:  AROM limited in all directions, but he lost his balance frequently, so accuracy of testing limited.   LOWER EXTREMITY ROM:      General mild stiffness throughout BLE with PROM.  LOWER EXTREMITY MMT:   MMT Right eval Left eval  Hip flexion 4- 4-  Hip extension    Hip abduction 4 4  Hip adduction    Hip internal rotation    Hip external rotation    Knee flexion 4+ 4-  Knee extension 4+ 4-  Ankle dorsiflexion 4 4  Ankle plantarflexion    Ankle inversion    Ankle eversion     (  Blank rows = not tested)  LUMBAR SPECIAL TESTS:  Slump test: Positive R  Shoulder Special Tests empty can test (+) R, Hawkins-Kennedy test (+) R. Previous MRI reveals full tear of supraspinatus and partial tear of infraspinatus.  FUNCTIONAL TESTS:  5 times sit to stand: 30 seconds struggled with weak knees Timed up and go (TUG): 27 seconds with SPC   TUG 10/12/22 = 13  seconds without device  Functional gait assessment: not tested BERG balance test:  23/56, 10/12/22 =29/56  GAIT: Distance walked: 80 Assistive device utilized: SPC, very unsteady used walls to stabilize, knees very shaky and unsteady, needing CGA to min A Level of assistance: CGA to min A for LOB Comments: Patient was unsteady with turns, relied on UE support for balance., legs shaky, presents like he has neuropathy but he does not, also reports double vision    TODAY'S TREATMENT  10/21/22 NuStep L5 x 6 minutes Practiced sit<> stand, controlling decent x 8 Leg press, 10 reps with BLE, 30#, ULE 2 x 5 with 20#, emphasizing eccentric control, needed A to RLE. Seated knee flexion, 55#, BLE, 2 x 10 reps Pedalled bike at min 85 rpm, 3 x 30 sec with L2 resistance. Able to maintain > 90 rpm on last 2 rounds. Sit to and from stand on air ex pad x 8 reps, emphasizing controlled decent, mod unsteadiness, but no LOB noted.  10/19/22 NuStep L5 x 6 minutes Supine chin tucks against towel, 10 x 5 sec Supine cervical rotation to each side. Had to pause and hold each time he turned to avoid dizziness Sit to stand from elevated mat, on Air ex pad, no UE support, x 10- very challenging. Stand on upside down BOSU ball in parallel bars, balance with no UE support. Very wobbly, but able to hold balance for up to 5 seconds. SLS at side of steps, lifting 1 foot up, over 8" object, back and forth. Required UUE support, so adjusted to flat object and he was able to perform without UE support, unsteady at times, CGA Alternate taps on 4" step without UE support, CGA, unsteady, but no LOB.   10/12/22 Nustep level 5 x 6 minutes [place] on computer tables and reaching side to side Step reach side to side with same arm and opposite [employer] TUG/BERG assessed STM to the left neck, some isometric cervical stretches, Contract relax side bending and rotation  10/10/22 Nustep level 5 x 6 minutes Standing  ball toss Cone reach side to side, then with a step Cone reach with full front to back with a steo Sit to stand from plinth on airex Cone pick ups from low to high Tried power moves with stepping and rotation difficult Back to wall butt and scap touches  Front to wall belly and head touches High knee marches Direction changes Side step reaches  10/06/22 NuStep L4 x 6 minutes B side step onto/off air ex at rail for needed UE support Stand with R leg on air ex, rotate to the L and back, with occasional UE support, repeat x 10 each direction. SLS lifting opp leg up and over cone on floor, required UUE support, 12 reps each leg Crossover stepping in parallel bars 3 x each direction, BUE support Back crossover in parallel bars x 3 with BUE support Gait with 3# weights in each hand, 120' with a U turn in the middle, improved stability noted Floor ladder work. Stepping in all directions upon therapist command, CGA, 1 episode of  balance loss Walk through floor ladder with weights, then without. Mild unsteadiness either way, poor coordination. Shoulder ext with 15#, 10 reps Paloff press 10 reps, 15# each direction.  09/29/22 Nustep level 4 x 6 minutes Bike Level 3 x 6 minutes On foot on airex standing balance Alternating 4" toe touches with using 1 and 2 canes In pbars side stepping walking FWD/BKWD Bend over and touch cone, difficulty coming up. Head turns while standing  PATIENT EDUCATION:  Education details: POC,  Person educated: Patient and Spouse Education method: Explanation Education comprehension: verbalized understanding   HOME EXERCISE PROGRAM: Access Code: [email or web address] URL: [email or web address] Date: 10/12/2022 Prepared by: Lum Babe  Exercises - Seated Isometric Cervical Sidebending  - 1 x daily - 7 x weekly - 2 sets - 10 reps - 3 hold - Seated Isometric Cervical Rotation  - 1 x daily - 7 x weekly - 2 sets - 10 reps - 3 hold  [email or web address]    ASSESSMENT:  CLINICAL IMPRESSION: Patient reports no falls. He has difficulty with eccentric quad control, noting that he tends to fall into his recliner at home and has to scoot it forward. Practiced sitting with more control by flexing forward at hips for improved balance, and he was able to show better control, still had difficulty due to weakness. Performed multiple exercises for B quad strength, eccentric control, as well as motor control in LE with quick pedalling on bike.  OBJECTIVE IMPAIRMENTS Abnormal gait, decreased activity tolerance, decreased balance, decreased cognition, decreased coordination, difficulty walking, decreased ROM, decreased strength, increased muscle spasms, impaired flexibility, postural dysfunction, and pain.   ACTIVITY LIMITATIONS carrying, lifting, bending, standing, squatting, stairs, reach over head, and locomotion level  PARTICIPATION LIMITATIONS: cleaning, laundry, driving, shopping, and community activity  PERSONAL FACTORS Age and 1 comorbidity: multiple meningiomas  are also affecting patient's functional outcome.   REHAB POTENTIAL: Good  CLINICAL DECISION MAKING: Evolving/moderate complexity  EVALUATION COMPLEXITY: Moderate   GOALS: Goals reviewed with patient? Yes  SHORT TERM GOALS: Target date: 10/04/22  I with initial HEP Baseline: Goal status: 10/21/22-met  LONG TERM GOALS: Target date: 12/20/22  I with final HEP Baseline:  Goal status: ongoing  2.  Patient will have no falls over a 6 week period Baseline: 2 falls  Goal status: 10/21/22, no falls in 2 weeks, ongoing  3.  Complete 5 x STS in <12 sec to demonstrate improved LE strength Baseline: 30 seconds Goal status: ongoing  4.  Patient will complete TUG in < 12 sec with or without AD Baseline: 27 seconds Goal status: progressing  5.  Patient will score at least 43/56 on Berg balance Baseline: 23/56 Goal status: progressing  6.  Patient will walk at least 600' with LRAD, MI  on level an unlevel surfaces, back pain < 3/10 Baseline: 80', unsteady, SPC with CGA/Min A Goal status: INITIAL   PLAN: PT FREQUENCY: 2x/week  PT DURATION: 12 weeks  PLANNED INTERVENTIONS: Therapeutic exercises, Therapeutic activity, Neuromuscular re-education, Balance training, Gait training, Patient/Family education, Self Care, Joint mobilization, Stair training, Dry Needling, Electrical stimulation, Cryotherapy, Moist heat, Ultrasound, Ionotophoresis 25m/ml Dexamethasone, and Manual therapy.  PLAN FOR NEXT SESSION:   continue with the balance can work on the neck, eccentric quad control  SEthel RanaDPT 10/21/22 10:58 AM

## 2022-10-25 ENCOUNTER — Encounter: Payer: Self-pay | Admitting: Physical Therapy

## 2022-10-25 ENCOUNTER — Ambulatory Visit: Payer: Medicare Other | Admitting: Physical Therapy

## 2022-10-25 DIAGNOSIS — D329 Benign neoplasm of meninges, unspecified: Secondary | ICD-10-CM | POA: Diagnosis not present

## 2022-10-25 DIAGNOSIS — M6281 Muscle weakness (generalized): Secondary | ICD-10-CM | POA: Diagnosis not present

## 2022-10-25 DIAGNOSIS — R531 Weakness: Secondary | ICD-10-CM

## 2022-10-25 DIAGNOSIS — R296 Repeated falls: Secondary | ICD-10-CM | POA: Diagnosis not present

## 2022-10-25 DIAGNOSIS — R278 Other lack of coordination: Secondary | ICD-10-CM | POA: Diagnosis not present

## 2022-10-25 DIAGNOSIS — R2681 Unsteadiness on feet: Secondary | ICD-10-CM

## 2022-10-25 NOTE — Therapy (Signed)
OUTPATIENT PHYSICAL THERAPY THORACOLUMBAR TREATMENT Progress Note Reporting Period 09/20/22 to 10/25/22  See note below for Objective Data and Assessment of Progress/Goals.      Patient Name: Timothy Moran MRN: [id number] DOB:03/06/1946, 77 y.o., male Today's Date: 10/25/2022   PT End of Session - 10/25/22 1056     Visit Number 10    Date for PT Re-Evaluation 12/20/22    Authorization Type Medicare    PT Start Time 1056    PT Stop Time 1142    PT Time Calculation (min) 46 min    Equipment Utilized During Treatment Gait belt    Activity Tolerance Patient tolerated treatment well    Behavior During Therapy WFL for tasks assessed/performed               Past Medical History:  Diagnosis Date   Acute blood loss anemia 10/05/2020   Acute respiratory failure ([place]), hypothermia therapy, vent - extubated 10/06/20    AKI (acute kidney injury) ([place]) 10/05/2020   Anoxic brain injury ([place]) 10/26/2020   Arrhythmia    Ascending aortic aneurysm ([place]) 01/29/2018   a. 2019 4.3 cm by echo; b. 02/2021 Echo: Ao root 92m.   Benign brain tumor ([place]    Benign neoplasm of brain ([place] 12/19/2017   Cardiac arrest with ventricular fibrillation ([place]    Chest pain 12/04/2017   Chronic kidney disease, stage III (moderate) ([place] 12/19/2017   CKD (chronic kidney disease), stage III - IV (HCC)    Coronary Artery Disease    a. 10000000Inf STEMI complicated by cardiac arrest/PCI: LAD 50p/m, LCX 100d (2.5 x 30 Resolute Onyx DES); b. 04/2021 PCI: 04/2021 LM nl, LAD 50p/862m2.5x30 Onyx Frontier DES), LCX 25p, 5060mFR 0.98), 40d ISR (RFR 0.98), OM2 25, RCA mild diff dzs.   Diabetes mellitus, type 2 ([place]/25/2022   10/06/20 A1C 7%   Dizziness 10/11/2019   Encounter for imaging study to confirm orogastric (OG) tube placement    Hematuria 10/26/2020   HFrEF (heart failure with reduced ejection fraction) ([place]  a. 11/2020 Echo: EF 25-30%; b. 02/2021 Echo: EF 25-30%, glob HK. Mild LVH. Nl RV fxn. Triv  AI. Ao root 75m16m History of pulmonary embolus (PE)    HLD (hyperlipidemia) 10/26/2020   Hypertension    Iron deficiency anemia rec'd IV iron 10/26/2020   Ischemic cardiomyopathy    a. 11/2020 Echo: EF 25-30%; b. 02/2021 Echo: EF 25-30%, glob HK.   Left bundle branch block 12/19/2017   Meningioma (HCC)[place]/11/2019   Nonsustained ventricular tachycardia (HCC)[place]/29/2021   Pain of left hip joint 05/29/2020   Personal history of pulmonary embolism 12/19/2017   Pneumonia of both lungs due to infectious organism    Pulmonary hypertension due to thromboembolism (HCC)[place]/28/2019   See echo 12/27/17 with nl PAS vs CTa chest 02/23/18    S/P angioplasty with stent 10/04/20 DES to LCX  10/26/2020   Sinus bradycardia 12/22/2017   SOB (shortness of breath)    Solitary pulmonary nodule on lung CT 03/08/2018   CT 12/04/17 1.0 x 0.8 x 0.7 cm nodular opacity in the RUL vs not seen 03/16/10 posterior segment of the right upper lobe. .[facility]pi[place] Kitchenometry 03/08/2018    FEV1 3.86 (108%)  Ratio 96 s prior rx  - PET  03/13/18   Low grade c/w adenoca > rec T surgery eval     STEMI (ST elevation myocardial infarction) (HCC)[place]/23/2022   Urinary retention 10/26/2020   Vestibular schwannoma (HCC)[place]/11/2019  Past Surgical History:  Procedure Laterality Date   APPENDECTOMY     BIV ICD INSERTION CRT-D N/A 11/03/2021   Procedure: BIV ICD INSERTION CRT-D;  Surgeon: Constance Haw, MD;  Location: [facility];  Service: Cardiovascular;  Laterality: N/A;   CARDIAC CATHETERIZATION     CORONARY ANGIOPLASTY WITH STENT PLACEMENT Left 04/12/2021   LAD stent placement   CORONARY STENT INTERVENTION N/A 04/12/2021   Procedure: CORONARY STENT INTERVENTION;  Surgeon: Nelva Bush, MD;  Location: [facility];  Service: Cardiovascular;  Laterality: N/A;   CORONARY/GRAFT ACUTE MI REVASCULARIZATION N/A 10/04/2020   Procedure: Coronary/Graft Acute MI Revascularization;  Surgeon: Nelva Bush, MD;  Location: [facility];  Service: Cardiovascular;  Laterality: N/A;   HERNIA REPAIR     INTRAVASCULAR PRESSURE WIRE/FFR STUDY N/A 04/12/2021   Procedure: INTRAVASCULAR PRESSURE WIRE/FFR STUDY;  Surgeon: Nelva Bush, MD;  Location: [facility];  Service: Cardiovascular;  Laterality: N/A;   INTRAVASCULAR ULTRASOUND/IVUS N/A 04/12/2021   Procedure: Intravascular Ultrasound/IVUS;  Surgeon: Nelva Bush, MD;  Location: [facility];  Service: Cardiovascular;  Laterality: N/A;   LEFT HEART CATH AND CORONARY ANGIOGRAPHY N/A 10/04/2020   Procedure: LEFT HEART CATH AND CORONARY ANGIOGRAPHY;  Surgeon: Nelva Bush, MD;  Location: [facility];  Service: Cardiovascular;  Laterality: N/A;   RIGHT/LEFT HEART CATH AND CORONARY ANGIOGRAPHY N/A 04/12/2021   Procedure: RIGHT/LEFT HEART CATH AND CORONARY ANGIOGRAPHY;  Surgeon: Nelva Bush, MD;  Location: [facility];  Service: Cardiovascular;  Laterality: N/A;   Patient Active Problem List   Diagnosis Date Noted   Anemia    Leukocytosis    Right arm weakness 01/25/2022   Right sided weakness    Fall at home 01/22/2022   Status post biventricular pacemaker - MRI compatible PPM and leads 01/22/2022   Chronic indwelling Foley catheter 01/22/2022   CKD (chronic kidney disease), stage III - IV ([place]) 06/28/2021   Chronic HFrEF (heart failure with reduced ejection fraction) ([place]) 03/31/2021   Arrhythmia    DM (diabetes mellitus), type 2 ([place]) new 10/26/2020   Urinary retention 10/26/2020   Hyperlipidemia LDL goal <70 10/26/2020   Coronary artery disease of native artery of native heart with stable angina pectoris ([place]) 10/26/2020   S/P angioplasty with stent 10/04/20 DES to LCX  10/26/2020   Ischemic cardiomyopathy 10/26/2020   Diabetes mellitus, type 2 ([place]) 10/06/2020   DOE (dyspnea on exertion)    Meningioma ([place]) 08/14/2020   Vestibular schwannoma ([place]) 08/14/2020   Cardiac arrest with ventricular fibrillation (HCC)    Pain of left hip  joint 05/29/2020   Benign brain tumor ([place])    Chronic kidney disease    History of pulmonary embolus (PE)    Hypertension    Nonsustained ventricular tachycardia ([place]) 10/11/2019   Dizziness 10/11/2019   Abnormal echocardiogram 06/07/2019   Pulmonary hypertension due to thromboembolism ([place]) 03/09/2018   Solitary pulmonary nodule on lung CT 03/08/2018   Ascending aortic aneurysm (HCC) 4.2 cm based on CT from December 2020 01/29/2018   Sinus bradycardia 12/22/2017   Left bundle branch block 12/19/2017   Personal history of pulmonary embolism - no longer on anticoagulation due to pt refusal to take it anymore. 12/19/2017   Chronic kidney disease, stage III (moderate) ([place]) 12/19/2017   Benign neoplasm of brain ([place]) 12/19/2017    PCP: Cari Caraway, MD   REFERRING PROVIDER: Cari Caraway, MD   REFERRING DIAG: Falls, unsteady gait, mm weakness Rationale  for Evaluation and Treatment Rehabilitation  THERAPY DIAG:  Unsteadiness on feet  Right sided weakness  Repeated falls  Muscle weakness (generalized)  ONSET DATE: 05/10/2022   SUBJECTIVE:                                                                                                                                                                                           SUBJECTIVE STATEMENT: Patient saw the eye doctor will undergo cataract surgery both eyes next week  PERTINENT HISTORY:  Brief HPI:   Dejean Dostal is a 77 y.o. male who fell backward striking his head and right neck shoulder area on 01/21/2022. He complained of right hemiparesis. CT head revealed left parafalcine meningioma.Started on Decadron with improvement in symptoms   PAIN:  Are you having pain? no PRECAUTIONS: ICD/Pacemaker  WEIGHT BEARING RESTRICTIONS No  FALLS:  Has patient fallen in last 6 months? Yes. Number of falls 2  LIVING ENVIRONMENT: Lives with: lives with their spouse Lives in: House/apartment Stairs: Yes: External: 7  steps; bilateral but cannot reach both Has following equipment at home: Walker - 2 wheeled and a cane, shower seat  OCCUPATION: Retired  PLOF: Independent  PATIENT GOALS Feel normal with standing and functioning.  Feel stronger and have better balance   OBJECTIVE:   DIAGNOSTIC FINDINGS:  CT Lumbar- 3. Moderate degenerative spinal stenosis at L2-3. CT CERVICAL IMPRESSION: 1. No evidence of intracranial injury. 2. Negative for cervical spine fracture but there is prevertebral edema at C4-5 suggesting soft tissue injury. 3. Meningiomas with left cerebral of edema, reference brain MRI 01/22/2022.  SCREENING FOR RED FLAGS: Bowel or bladder incontinence: No Spinal tumors: No Cauda equina syndrome: No Compression fracture: No Abdominal aneurysm: Yes: Abdominal aortic aneurysm, stable  COGNITION:  Overall cognitive status: WFL     SENSATION: WFL Patient reports tingling in B hands, all over.  MUSCLE LENGTH: Hamstrings: approximately 55 degrees B.  Was 70 degrees in September 2023  POSTURE: rounded shoulders and forward head  PALPATION: No TTP in R shoulder or up traps. B up traps tight, low back TTP R and L proximal to Illiac crest  LUMBAR ROM:  AROM limited in all directions, but he lost his balance frequently, so accuracy of testing limited.   LOWER EXTREMITY ROM:      General mild stiffness throughout BLE with PROM.  LOWER EXTREMITY MMT:   MMT Right eval Left eval  Hip flexion 4- 4-  Hip extension    Hip abduction 4 4  Hip adduction    Hip internal rotation    Hip external rotation    Knee flexion 4+ 4-  Knee extension 4+ 4-  Ankle dorsiflexion 4 4  Ankle plantarflexion    Ankle inversion    Ankle eversion     (Blank rows = not tested)  LUMBAR SPECIAL TESTS:  Slump test: Positive R  Shoulder Special Tests empty can test (+) R, Hawkins-Kennedy test (+) R. Previous MRI reveals full tear of supraspinatus and partial tear of infraspinatus.  FUNCTIONAL  TESTS:  5 times sit to stand: 30 seconds struggled with weak knees Timed up and go (TUG): 27 seconds with SPC   TUG 10/12/22 = 13 seconds without device  Functional gait assessment: not tested BERG balance test:  23/56, 10/12/22 =29/56  GAIT: Distance walked: 80 Assistive device utilized: SPC, very unsteady used walls to stabilize, knees very shaky and unsteady, needing CGA to min A Level of assistance: CGA to min A for LOB Comments: Patient was unsteady with turns, relied on UE support for balance., legs shaky, presents like he has neuropathy but he does not, also reports double vision    TODAY'S TREATMENT  10/25/22 Nustep level 5 x 6 mintues Leg curls 35# 2x15 10# 2x10 leg extension 5# straight arm pulls on and off airex with CGA Sit to stand with elevated surface holding yellow weighted ball In pbars side stepping on airex balance beam In pbars marching, hip abduction and extension Walking direction changes Volleyball Tandem walk with min A  10/21/22 NuStep L5 x 6 minutes Practiced sit<> stand, controlling decent x 8 Leg press, 10 reps with BLE, 30#, ULE 2 x 5 with 20#, emphasizing eccentric control, needed A to RLE. Seated knee flexion, 55#, BLE, 2 x 10 reps Pedalled bike at min 85 rpm, 3 x 30 sec with L2 resistance. Able to maintain > 90 rpm on last 2 rounds. Sit to and from stand on air ex pad x 8 reps, emphasizing controlled decent, mod unsteadiness, but no LOB noted.  10/19/22 NuStep L5 x 6 minutes Supine chin tucks against towel, 10 x 5 sec Supine cervical rotation to each side. Had to pause and hold each time he turned to avoid dizziness Sit to stand from elevated mat, on Air ex pad, no UE support, x 10- very challenging. Stand on upside down BOSU ball in parallel bars, balance with no UE support. Very wobbly, but able to hold balance for up to 5 seconds. SLS at side of steps, lifting 1 foot up, over 8" object, back and forth. Required UUE support, so adjusted to flat  object and he was able to perform without UE support, unsteady at times, CGA Alternate taps on 4" step without UE support, CGA, unsteady, but no LOB.   10/12/22 Nustep level 5 x 6 minutes Cones on computer tables and reaching side to side Step reach side to side with same arm and opposite [employer] TUG/BERG assessed STM to the left neck, some isometric cervical stretches, Contract relax side bending and rotation  10/10/22 Nustep level 5 x 6 minutes Standing ball toss Cone reach side to side, then with a step Cone reach with full front to back with a steo Sit to stand from plinth on airex Cone pick ups from low to high Tried power moves with stepping and rotation difficult Back to wall butt and scap touches  Front to wall belly and head touches High knee marches Direction changes Side step reaches  10/06/22 NuStep L4 x 6 minutes B side step onto/off air ex at rail for needed UE support Stand with R leg on air  ex, rotate to the L and back, with occasional UE support, repeat x 10 each direction. SLS lifting opp leg up and over cone on floor, required UUE support, 12 reps each leg Crossover stepping in parallel bars 3 x each direction, BUE support Back crossover in parallel bars x 3 with BUE support Gait with 3# weights in each hand, 120' with a U turn in the middle, improved stability noted Floor ladder work. Stepping in all directions upon therapist command, CGA, 1 episode of balance loss Walk through floor ladder with weights, then without. Mild unsteadiness either way, poor coordination. Shoulder ext with 15#, 10 reps Paloff press 10 reps, 15# each direction.  09/29/22 Nustep level 4 x 6 minutes Bike Level 3 x 6 minutes On foot on airex standing balance Alternating 4" toe touches with using 1 and 2 canes In pbars side stepping walking FWD/BKWD Bend over and touch cone, difficulty coming up. Head turns while standing  PATIENT EDUCATION:  Education details:  POC,  Person educated: Patient and Spouse Education method: Explanation Education comprehension: verbalized understanding   HOME EXERCISE PROGRAM: Access Code: [email or web address] URL: [email or web address] Date: 10/12/2022 Prepared by: Lum Babe  Exercises - Seated Isometric Cervical Sidebending  - 1 x daily - 7 x weekly - 2 sets - 10 reps - 3 hold - Seated Isometric Cervical Rotation  - 1 x daily - 7 x weekly - 2 sets - 10 reps - 3 hold  [email or web address]   ASSESSMENT:  CLINICAL IMPRESSION: Patient reports no falls. He has difficulty with eccentric quad control, noting that he tends to fall into his recliner at home and has to scoot it forward. Patient really with difficulty balancing , tends to be toes and heels always working, to do tandem walk her really needs min A to attempt, with the sit t stands I had to raise the seating surface.    OBJECTIVE IMPAIRMENTS Abnormal gait, decreased activity tolerance, decreased balance, decreased cognition, decreased coordination, difficulty walking, decreased ROM, decreased strength, increased muscle spasms, impaired flexibility, postural dysfunction, and pain.   ACTIVITY LIMITATIONS carrying, lifting, bending, standing, squatting, stairs, reach over head, and locomotion level  PARTICIPATION LIMITATIONS: cleaning, laundry, driving, shopping, and community activity  PERSONAL FACTORS Age and 1 comorbidity: multiple meningiomas  are also affecting patient's functional outcome.   REHAB POTENTIAL: Good  CLINICAL DECISION MAKING: Evolving/moderate complexity  EVALUATION COMPLEXITY: Moderate   GOALS: Goals reviewed with patient? Yes  SHORT TERM GOALS: Target date: 10/04/22  I with initial HEP Baseline: Goal status: 10/21/22-met  LONG TERM GOALS: Target date: 12/20/22  I with final HEP Baseline:  Goal status: ongoing  2.  Patient will have no falls over a 6 week period Baseline: 2 falls  Goal status: 10/21/22, no falls in 2 weeks,  ongoing  3.  Complete 5 x STS in <12 sec to demonstrate improved LE strength Baseline: 30 seconds Goal status: ongoing  4.  Patient will complete TUG in < 12 sec with or without AD Baseline: 27 seconds Goal status: progressing  5.  Patient will score at least 43/56 on Berg balance Baseline: 23/56 Goal status: progressing  6.  Patient will walk at least 600' with LRAD, MI on level an unlevel surfaces, back pain < 3/10 Baseline: 80', unsteady, SPC with CGA/Min A Goal status: progressing   PLAN: PT FREQUENCY: 2x/week  PT DURATION: 12 weeks  PLANNED INTERVENTIONS: Therapeutic exercises, Therapeutic activity, Neuromuscular re-education, Balance training, Gait training, Patient/Family education, Self Care, Joint mobilization, Stair training, Dry  Needling, Electrical stimulation, Cryotherapy, Moist heat, Ultrasound, Ionotophoresis 87m/ml Dexamethasone, and Manual therapy.  PLAN FOR NEXT SESSION:   continue with the balance can work on the neck, eccentric quad control  SEthel RanaDPT 10/25/22 11:01 AM

## 2022-10-27 ENCOUNTER — Other Ambulatory Visit: Payer: Self-pay | Admitting: *Deleted

## 2022-10-27 ENCOUNTER — Ambulatory Visit: Payer: Medicare Other | Admitting: Physical Therapy

## 2022-10-27 ENCOUNTER — Encounter: Payer: Self-pay | Admitting: Physical Therapy

## 2022-10-27 DIAGNOSIS — R278 Other lack of coordination: Secondary | ICD-10-CM | POA: Diagnosis not present

## 2022-10-27 DIAGNOSIS — R2681 Unsteadiness on feet: Secondary | ICD-10-CM | POA: Diagnosis not present

## 2022-10-27 DIAGNOSIS — Z79899 Other long term (current) drug therapy: Secondary | ICD-10-CM

## 2022-10-27 DIAGNOSIS — M6281 Muscle weakness (generalized): Secondary | ICD-10-CM

## 2022-10-27 DIAGNOSIS — R296 Repeated falls: Secondary | ICD-10-CM

## 2022-10-27 DIAGNOSIS — R531 Weakness: Secondary | ICD-10-CM | POA: Diagnosis not present

## 2022-10-27 DIAGNOSIS — D329 Benign neoplasm of meninges, unspecified: Secondary | ICD-10-CM

## 2022-10-27 NOTE — Therapy (Signed)
OUTPATIENT PHYSICAL THERAPY THORACOLUMBAR TREATMENT Progress Note Reporting Period 09/20/22 to 10/25/22  See note below for Objective Data and Assessment of Progress/Goals.      Patient Name: Timothy Moran MRN: [id number] DOB:[birth year], 77 y.o., male Today's Date: 10/27/2022   PT End of Session - 10/27/22 1106     Visit Number 11    Date for PT Re-Evaluation 12/20/22    PT Start Time 1058    PT Stop Time 1140    PT Time Calculation (min) 42 min    Activity Tolerance Patient tolerated treatment well    Behavior During Therapy [facility] for tasks assessed/performed               Past Medical History:  Diagnosis Date   Acute blood loss anemia 10/05/2020   Acute respiratory failure ([place]), hypothermia therapy, vent - extubated 10/06/20    AKI (acute kidney injury) ([place]) 10/05/2020   Anoxic brain injury ([place]) 10/26/2020   Arrhythmia    Ascending aortic aneurysm ([place]) 01/29/2018   a. 2019 4.3 cm by echo; b. 02/2021 Echo: Ao root 23m.   Benign brain tumor ([place]    Benign neoplasm of brain ([place] 12/19/2017   Cardiac arrest with ventricular fibrillation ([place]    Chest pain 12/04/2017   Chronic kidney disease, stage III (moderate) ([place] 12/19/2017   CKD (chronic kidney disease), stage III - IV (HCC)    Coronary Artery Disease    a. 10000000Inf STEMI complicated by cardiac arrest/PCI: LAD 50p/m, LCX 100d (2.5 x 30 Resolute Onyx DES); b. 04/2021 PCI: 04/2021 LM nl, LAD 50p/858m2.5x30 Onyx Frontier DES), LCX 25p, 5043mFR 0.98), 40d ISR (RFR 0.98), OM2 25, RCA mild diff dzs.   Diabetes mellitus, type 2 ([place]/25/2022   10/06/20 A1C 7%   Dizziness 10/11/2019   Encounter for imaging study to confirm orogastric (OG) tube placement    Hematuria 10/26/2020   HFrEF (heart failure with reduced ejection fraction) ([place]  a. 11/2020 Echo: EF 25-30%; b. 02/2021 Echo: EF 25-30%, glob HK. Mild LVH. Nl RV fxn. Triv AI. Ao root 59m58m History of pulmonary embolus (PE)    HLD (hyperlipidemia)  10/26/2020   Hypertension    Iron deficiency anemia rec'd IV iron 10/26/2020   Ischemic cardiomyopathy    a. 11/2020 Echo: EF 25-30%; b. 02/2021 Echo: EF 25-30%, glob HK.   Left bundle branch block 12/19/2017   Meningioma (HCC)[place]/11/2019   Nonsustained ventricular tachycardia (HCC)[place]/29/2021   Pain of left hip joint 05/29/2020   Personal history of pulmonary embolism 12/19/2017   Pneumonia of both lungs due to infectious organism    Pulmonary hypertension due to thromboembolism (HCC)[place]/28/2019   See echo 12/27/17 with nl PAS vs CTa chest 02/23/18    S/P angioplasty with stent 10/04/20 DES to LCX  10/26/2020   Sinus bradycardia 12/22/2017   SOB (shortness of breath)    Solitary pulmonary nodule on lung CT 03/08/2018   CT 12/04/17 1.0 x 0.8 x 0.7 cm nodular opacity in the RUL vs not seen 03/16/10 posterior segment of the right upper lobe. .SpiMarland Kitchenometry 03/08/2018    FEV1 3.86 (108%)  Ratio 96 s prior rx  - PET  03/13/18   Low grade c/w adenoca > rec T surgery eval     STEMI (ST elevation myocardial infarction) (HCC)[place]/23/2022   Urinary retention 10/26/2020   Vestibular schwannoma (HCC)[place]/11/2019   Past Surgical History:  Procedure Laterality Date   APPENDECTOMY  BIV ICD INSERTION CRT-D N/A 11/03/2021   Procedure: BIV ICD INSERTION CRT-D;  Surgeon: Constance Haw, MD;  Location: [facility];  Service: Cardiovascular;  Laterality: N/A;   CARDIAC CATHETERIZATION     CORONARY ANGIOPLASTY WITH STENT PLACEMENT Left 04/12/2021   LAD stent placement   CORONARY STENT INTERVENTION N/A 04/12/2021   Procedure: CORONARY STENT INTERVENTION;  Surgeon: Nelva Bush, MD;  Location: [facility];  Service: Cardiovascular;  Laterality: N/A;   CORONARY/GRAFT ACUTE MI REVASCULARIZATION N/A 10/04/2020   Procedure: Coronary/Graft Acute MI Revascularization;  Surgeon: Nelva Bush, MD;  Location: [facility];  Service: Cardiovascular;  Laterality: N/A;   HERNIA REPAIR      INTRAVASCULAR PRESSURE WIRE/FFR STUDY N/A 04/12/2021   Procedure: INTRAVASCULAR PRESSURE WIRE/FFR STUDY;  Surgeon: Nelva Bush, MD;  Location: [facility];  Service: Cardiovascular;  Laterality: N/A;   INTRAVASCULAR ULTRASOUND/IVUS N/A 04/12/2021   Procedure: Intravascular Ultrasound/IVUS;  Surgeon: Nelva Bush, MD;  Location: [facility];  Service: Cardiovascular;  Laterality: N/A;   LEFT HEART CATH AND CORONARY ANGIOGRAPHY N/A 10/04/2020   Procedure: LEFT HEART CATH AND CORONARY ANGIOGRAPHY;  Surgeon: Nelva Bush, MD;  Location: [facility];  Service: Cardiovascular;  Laterality: N/A;   RIGHT/LEFT HEART CATH AND CORONARY ANGIOGRAPHY N/A 04/12/2021   Procedure: RIGHT/LEFT HEART CATH AND CORONARY ANGIOGRAPHY;  Surgeon: Nelva Bush, MD;  Location: [facility];  Service: Cardiovascular;  Laterality: N/A;   Patient Active Problem List   Diagnosis Date Noted   Anemia    Leukocytosis    Right arm weakness 01/25/2022   Right sided weakness    Fall at home 01/22/2022   Status post biventricular pacemaker - MRI compatible PPM and leads 01/22/2022   Chronic indwelling Foley catheter 01/22/2022   CKD (chronic kidney disease), stage III - IV ([place]) 06/28/2021   Chronic HFrEF (heart failure with reduced ejection fraction) ([place]) 03/31/2021   Arrhythmia    DM (diabetes mellitus), type 2 ([place]) new 10/26/2020   Urinary retention 10/26/2020   Hyperlipidemia LDL goal <70 10/26/2020   Coronary artery disease of native artery of native heart with stable angina pectoris ([place]) 10/26/2020   S/P angioplasty with stent 10/04/20 DES to LCX  10/26/2020   Ischemic cardiomyopathy 10/26/2020   Diabetes mellitus, type 2 ([place]) 10/06/2020   DOE (dyspnea on exertion)    Meningioma ([place]) 08/14/2020   Vestibular schwannoma ([place]) 08/14/2020   Cardiac arrest with ventricular fibrillation (HCC)    Pain of left hip joint 05/29/2020   Benign brain tumor ([place])    Chronic kidney  disease    History of pulmonary embolus (PE)    Hypertension    Nonsustained ventricular tachycardia ([place]) 10/11/2019   Dizziness 10/11/2019   Abnormal echocardiogram 06/07/2019   Pulmonary hypertension due to thromboembolism ([place]) 03/09/2018   Solitary pulmonary nodule on lung CT 03/08/2018   Ascending aortic aneurysm (HCC) 4.2 cm based on CT from December 2020 01/29/2018   Sinus bradycardia 12/22/2017   Left bundle branch block 12/19/2017   Personal history of pulmonary embolism - no longer on anticoagulation due to pt refusal to take it anymore. 12/19/2017   Chronic kidney disease, stage III (moderate) ([place]) 12/19/2017   Benign neoplasm of brain ([place]) 12/19/2017    PCP: Cari Caraway, MD   REFERRING PROVIDER: Cari Caraway, MD   REFERRING DIAG: Falls, unsteady gait, mm weakness Rationale for Evaluation and Treatment Rehabilitation  THERAPY DIAG:  Unsteadiness on feet  Right  sided weakness  Repeated falls  Muscle weakness (generalized)  Meningioma ([place])  Other lack of coordination  ONSET DATE: 05/10/2022   SUBJECTIVE:                                                                                                                                                                                           SUBJECTIVE STATEMENT: Patient reports that he was very tired and sore after his last treatment. He did work out yesterday. His cataract surgery is scheduled for Monday. He cancelled his Tues appointment, but so far still has the Th appointment. He will check with his Dr as to whether he should cancel that one as well. There may be precautions against exercise in the week after surgery.  PERTINENT HISTORY:  Brief HPI:   Hristopher Hopman is a 77 y.o. male who fell backward striking his head and right neck shoulder area on 01/21/2022. He complained of right hemiparesis. CT head revealed left parafalcine meningioma.Started on Decadron with improvement in symptoms   PAIN:   Are you having pain? no PRECAUTIONS: ICD/Pacemaker  WEIGHT BEARING RESTRICTIONS No  FALLS:  Has patient fallen in last 6 months? Yes. Number of falls 2  LIVING ENVIRONMENT: Lives with: lives with their spouse Lives in: House/apartment Stairs: Yes: External: 7 steps; bilateral but cannot reach both Has following equipment at home: Walker - 2 wheeled and a cane, shower seat  OCCUPATION: Retired  PLOF: Independent  PATIENT GOALS Feel normal with standing and functioning.  Feel stronger and have better balance   OBJECTIVE:   DIAGNOSTIC FINDINGS:  CT Lumbar- 3. Moderate degenerative spinal stenosis at L2-3. CT CERVICAL IMPRESSION: 1. No evidence of intracranial injury. 2. Negative for cervical spine fracture but there is prevertebral edema at C4-5 suggesting soft tissue injury. 3. Meningiomas with left cerebral of edema, reference brain MRI 01/22/2022.  SCREENING FOR RED FLAGS: Bowel or bladder incontinence: No Spinal tumors: No Cauda equina syndrome: No Compression fracture: No Abdominal aneurysm: Yes: Abdominal aortic aneurysm, stable  COGNITION:  Overall cognitive status: WFL     SENSATION: WFL Patient reports tingling in B hands, all over.  MUSCLE LENGTH: Hamstrings: approximately 55 degrees B.  Was 70 degrees in September 2023  POSTURE: rounded shoulders and forward head  PALPATION: No TTP in R shoulder or up traps. B up traps tight, low back TTP R and L proximal to Illiac crest  LUMBAR ROM:  AROM limited in all directions, but he lost his balance frequently, so accuracy of testing limited.   LOWER EXTREMITY ROM:      General mild stiffness throughout BLE with PROM.  LOWER EXTREMITY  MMT:   MMT Right eval Left eval  Hip flexion 4- 4-  Hip extension    Hip abduction 4 4  Hip adduction    Hip internal rotation    Hip external rotation    Knee flexion 4+ 4-  Knee extension 4+ 4-  Ankle dorsiflexion 4 4  Ankle plantarflexion    Ankle inversion     Ankle eversion     (Blank rows = not tested)  LUMBAR SPECIAL TESTS:  Slump test: Positive R  Shoulder Special Tests empty can test (+) R, Hawkins-Kennedy test (+) R. Previous MRI reveals full tear of supraspinatus and partial tear of infraspinatus.  FUNCTIONAL TESTS:  5 times sit to stand: 30 seconds struggled with weak knees Timed up and go (TUG): 27 seconds with SPC   TUG 10/12/22 = 13 seconds without device  Functional gait assessment: not tested BERG balance test:  23/56, 10/12/22 =29/56  GAIT: Distance walked: 80 Assistive device utilized: SPC, very unsteady used walls to stabilize, knees very shaky and unsteady, needing CGA to min A Level of assistance: CGA to min A for LOB Comments: Patient was unsteady with turns, relied on UE support for balance., legs shaky, presents like he has neuropathy but he does not, also reports double vision    TODAY'S TREATMENT  10/27/22 NuStep L5 x 6 minutes, U and LE, then attempted 30 sec at L8 for strength, legs only. Patient reported R knee pain, so tried to decrease resistance to L7, still had pain, so deferred. Assessed for ITB tightness, with pain noted mid thigh and proximally, with tightness. STM to R ITB F/B stretch to ITB and R hip as well. Sit to/from stand from mat with emphasis on slow decent x 10. Seated knee flex, BLE 35#, 2 x 10 Seated eccentric knee ext, 10#, 1 x 10 with each Upside down BOSU in parallel bars. Attempting static balance, 2 minutes. Max of ~10 sec. Wall bumps started at 4", progressed to approximately 12". Very stable and controlled in posterior weight shifts.  10/25/22 Nustep level 5 x 6 mintues Leg curls 35# 2x15 10# 2x10 leg extension 5# straight arm pulls on and off airex with CGA Sit to stand with elevated surface holding yellow weighted ball In pbars side stepping on airex balance beam In pbars marching, hip abduction and extension Walking direction changes Volleyball Tandem walk with min  A  10/21/22 NuStep L5 x 6 minutes Practiced sit<> stand, controlling decent x 8 Leg press, 10 reps with BLE, 30#, ULE 2 x 5 with 20#, emphasizing eccentric control, needed A to RLE. Seated knee flexion, 55#, BLE, 2 x 10 reps Pedalled bike at min 85 rpm, 3 x 30 sec with L2 resistance. Able to maintain > 90 rpm on last 2 rounds. Sit to and from stand on air ex pad x 8 reps, emphasizing controlled decent, mod unsteadiness, but no LOB noted.  10/19/22 NuStep L5 x 6 minutes Supine chin tucks against towel, 10 x 5 sec Supine cervical rotation to each side. Had to pause and hold each time he turned to avoid dizziness Sit to stand from elevated mat, on Air ex pad, no UE support, x 10- very challenging. Stand on upside down BOSU ball in parallel bars, balance with no UE support. Very wobbly, but able to hold balance for up to 5 seconds. SLS at side of steps, lifting 1 foot up, over 8" object, back and forth. Required UUE support, so adjusted to flat object and he was  able to perform without UE support, unsteady at times, CGA Alternate taps on 4" step without UE support, CGA, unsteady, but no LOB.   10/12/22 Nustep level 5 x 6 minutes [place] on computer tables and reaching side to side Step reach side to side with same arm and opposite [employer] TUG/BERG assessed STM to the left neck, some isometric cervical stretches, Contract relax side bending and rotation  10/10/22 Nustep level 5 x 6 minutes Standing ball toss Cone reach side to side, then with a step Cone reach with full front to back with a steo Sit to stand from plinth on airex Cone pick ups from low to high Tried power moves with stepping and rotation difficult Back to wall butt and scap touches  Front to wall belly and head touches High knee marches Direction changes Side step reaches  10/06/22 NuStep L4 x 6 minutes B side step onto/off air ex at rail for needed UE support Stand with R leg on air ex, rotate to the L  and back, with occasional UE support, repeat x 10 each direction. SLS lifting opp leg up and over cone on floor, required UUE support, 12 reps each leg Crossover stepping in parallel bars 3 x each direction, BUE support Back crossover in parallel bars x 3 with BUE support Gait with 3# weights in each hand, 120' with a U turn in the middle, improved stability noted Floor ladder work. Stepping in all directions upon therapist command, CGA, 1 episode of balance loss Walk through floor ladder with weights, then without. Mild unsteadiness either way, poor coordination. Shoulder ext with 15#, 10 reps Paloff press 10 reps, 15# each direction.  09/29/22 Nustep level 4 x 6 minutes Bike Level 3 x 6 minutes On foot on airex standing balance Alternating 4" toe touches with using 1 and 2 canes In pbars side stepping walking FWD/BKWD Bend over and touch cone, difficulty coming up. Head turns while standing  PATIENT EDUCATION:  Education details: POC,  Person educated: Patient and Spouse Education method: Explanation Education comprehension: verbalized understanding   HOME EXERCISE PROGRAM: Access Code: [email or web address] URL: [email or web address] Date: 10/12/2022 Prepared by: Lum Babe  Exercises - Seated Isometric Cervical Sidebending  - 1 x daily - 7 x weekly - 2 sets - 10 reps - 3 hold - Seated Isometric Cervical Rotation  - 1 x daily - 7 x weekly - 2 sets - 10 reps - 3 hold  [email or web address]   ASSESSMENT:  CLINICAL IMPRESSION: Patient reports no falls. He was very sore and fatigued after his last treatment, but did work out again yesterday. Treatment focused on quad strength and control and also balance, weiht shifts in control. B cataract surgery scheduled for Monday, he will communicate with his Dr to see when he should return to therapy.  OBJECTIVE IMPAIRMENTS Abnormal gait, decreased activity tolerance, decreased balance, decreased cognition, decreased coordination, difficulty  walking, decreased ROM, decreased strength, increased muscle spasms, impaired flexibility, postural dysfunction, and pain.   ACTIVITY LIMITATIONS carrying, lifting, bending, standing, squatting, stairs, reach over head, and locomotion level  PARTICIPATION LIMITATIONS: cleaning, laundry, driving, shopping, and community activity  PERSONAL FACTORS Age and 1 comorbidity: multiple meningiomas  are also affecting patient's functional outcome.   REHAB POTENTIAL: Good  CLINICAL DECISION MAKING: Evolving/moderate complexity  EVALUATION COMPLEXITY: Moderate   GOALS: Goals reviewed with patient? Yes  SHORT TERM GOALS: Target date: 10/04/22  I with initial HEP Baseline: Goal status: 10/21/22-met  LONG TERM GOALS: Target date: 12/20/22  I with final HEP Baseline:  Goal status: ongoing  2.  Patient will have no falls over a 6 week period Baseline: 2 falls  Goal status: 10/21/22, no falls in 2 weeks, ongoing  3.  Complete 5 x STS in <12 sec to demonstrate improved LE strength Baseline: 30 seconds Goal status: ongoing  4.  Patient will complete TUG in < 12 sec with or without AD Baseline: 27 seconds Goal status: progressing  5.  Patient will score at least 43/56 on Berg balance Baseline: 23/56 Goal status: progressing  6.  Patient will walk at least 600' with LRAD, MI on level an unlevel surfaces, back pain < 3/10 Baseline: 80', unsteady, SPC with CGA/Min A Goal status: progressing   PLAN: PT FREQUENCY: 2x/week  PT DURATION: 12 weeks  PLANNED INTERVENTIONS: Therapeutic exercises, Therapeutic activity, Neuromuscular re-education, Balance training, Gait training, Patient/Family education, Self Care, Joint mobilization, Stair training, Dry Needling, Electrical stimulation, Cryotherapy, Moist heat, Ultrasound, Ionotophoresis 39m/ml Dexamethasone, and Manual therapy.  PLAN FOR NEXT SESSION:   continue with the balance can work on the neck, eccentric quad control  SEthel Rana DPT 10/27/22 11:46 AM

## 2022-11-01 ENCOUNTER — Ambulatory Visit: Payer: Medicare Other | Admitting: Physical Therapy

## 2022-11-03 ENCOUNTER — Encounter: Payer: Self-pay | Admitting: Physical Therapy

## 2022-11-03 ENCOUNTER — Ambulatory Visit: Payer: Medicare Other | Admitting: Physical Therapy

## 2022-11-03 DIAGNOSIS — R278 Other lack of coordination: Secondary | ICD-10-CM | POA: Diagnosis not present

## 2022-11-03 DIAGNOSIS — M6281 Muscle weakness (generalized): Secondary | ICD-10-CM

## 2022-11-03 DIAGNOSIS — R296 Repeated falls: Secondary | ICD-10-CM

## 2022-11-03 DIAGNOSIS — R531 Weakness: Secondary | ICD-10-CM | POA: Diagnosis not present

## 2022-11-03 DIAGNOSIS — D329 Benign neoplasm of meninges, unspecified: Secondary | ICD-10-CM

## 2022-11-03 DIAGNOSIS — R2681 Unsteadiness on feet: Secondary | ICD-10-CM | POA: Diagnosis not present

## 2022-11-03 NOTE — Therapy (Signed)
OUTPATIENT PHYSICAL THERAPY THORACOLUMBAR TREATMENT Progress Note Reporting Period 09/20/22 to 10/25/22  See note below for Objective Data and Assessment of Progress/Goals.      Patient Name: Timothy Moran MRN: [id number] DOB:[birth year], 77 y.o., male Today's Date: 11/03/2022   PT End of Session - 11/03/22 1106     Visit Number 12    Date for PT Re-Evaluation 12/20/22    PT Start Time 1102    PT Stop Time 1140    PT Time Calculation (min) 38 min    Equipment Utilized During Treatment Gait belt    Activity Tolerance Patient tolerated treatment well               Past Medical History:  Diagnosis Date   Acute blood loss anemia 10/05/2020   Acute respiratory failure ([place]), hypothermia therapy, vent - extubated 10/06/20    AKI (acute kidney injury) ([place]) 10/05/2020   Anoxic brain injury ([place]) 10/26/2020   Arrhythmia    Ascending aortic aneurysm ([place]) 01/29/2018   a. 2019 4.3 cm by echo; b. 02/2021 Echo: Ao root 84m.   Benign brain tumor ([place]    Benign neoplasm of brain ([facility] [place] 12/19/2017   Cardiac arrest with ventricular fibrillation ([place]    Chest pain 12/04/2017   Chronic kidney disease, stage III (moderate) ([place] 12/19/2017   CKD (chronic kidney disease), stage III - IV (HCC)    Coronary Artery Disease    a. 10000000Inf STEMI complicated by cardiac arrest/PCI: LAD 50p/m, LCX 100d (2.5 x 30 Resolute Onyx DES); b. 04/2021 PCI: 04/2021 LM nl, LAD 50p/863m2.5x30 Onyx Frontier DES), LCX 25p, 5029mFR 0.98), 40d ISR (RFR 0.98), OM2 25, RCA mild diff dzs.   Diabetes mellitus, type 2 ([facility]1/25/2022   10/06/20 A1C 7%   Dizziness 10/11/2019   Encounter for imaging study to confirm orogastric (OG) tube placement    Hematuria 10/26/2020   HFrEF (heart failure with reduced ejection fraction) ([facility]  a. 11/2020 Echo: EF 25-30%; b. 02/2021 Echo: EF 25-30%, glob HK. Mild LVH. Nl RV fxn. Triv AI. Ao root 37m20m History of pulmonary embolus (PE)    HLD (hyperlipidemia) 10/26/2020    Hypertension    Iron deficiency anemia rec'd IV iron 10/26/2020   Ischemic cardiomyopathy    a. 11/2020 Echo: EF 25-30%; b. 02/2021 Echo: EF 25-30%, glob HK.   Left bundle branch block 12/19/2017   Meningioma (HCC)[place]/11/2019   Nonsustained ventricular tachycardia (HCC)[place]/29/2021   Pain of left hip joint 05/29/2020   Personal history of pulmonary embolism 12/19/2017   Pneumonia of both lungs due to infectious organism    Pulmonary hypertension due to thromboembolism (HCC)[place]/28/2019   See echo 12/27/17 with nl PAS vs CTa chest 02/23/18    S/P angioplasty with stent 10/04/20 DES to LCX  10/26/2020   Sinus bradycardia 12/22/2017   SOB (shortness of breath)    Solitary pulmonary nodule on lung CT 03/08/2018   CT 12/04/17 1.0 x 0.8 x 0.7 cm nodular opacity in the RUL vs not seen 03/16/10 posterior segment of the right upper lobe. .SpiMarland Kitchenometry 03/08/2018    FEV1 3.86 (108%)  Ratio 96 s prior rx  - PET  03/13/18   Low grade c/w adenoca > rec T surgery eval     STEMI (ST elevation myocardial infarction) (HCC)[place]/23/2022   Urinary retention 10/26/2020   Vestibular schwannoma (HCC)[place]/11/2019   Past Surgical History:  Procedure Laterality Date   APPENDECTOMY  BIV ICD INSERTION CRT-D N/A 11/03/2021   Procedure: BIV ICD INSERTION CRT-D;  Surgeon: Constance Haw, MD;  Location: [facility];  Service: Cardiovascular;  Laterality: N/A;   CARDIAC CATHETERIZATION     CORONARY ANGIOPLASTY WITH STENT PLACEMENT Left 04/12/2021   LAD stent placement   CORONARY STENT INTERVENTION N/A 04/12/2021   Procedure: CORONARY STENT INTERVENTION;  Surgeon: Nelva Bush, MD;  Location: [facility];  Service: Cardiovascular;  Laterality: N/A;   CORONARY/GRAFT ACUTE MI REVASCULARIZATION N/A 10/04/2020   Procedure: Coronary/Graft Acute MI Revascularization;  Surgeon: Nelva Bush, MD;  Location: [facility];  Service: Cardiovascular;  Laterality: N/A;   HERNIA REPAIR     INTRAVASCULAR PRESSURE  WIRE/FFR STUDY N/A 04/12/2021   Procedure: INTRAVASCULAR PRESSURE WIRE/FFR STUDY;  Surgeon: Nelva Bush, MD;  Location: [facility] [facility];  Service: Cardiovascular;  Laterality: N/A;   INTRAVASCULAR ULTRASOUND/IVUS N/A 04/12/2021   Procedure: Intravascular Ultrasound/IVUS;  Surgeon: Nelva Bush, MD;  Location: [facility];  Service: Cardiovascular;  Laterality: N/A;   LEFT HEART CATH AND CORONARY ANGIOGRAPHY N/A 10/04/2020   Procedure: LEFT HEART CATH AND CORONARY ANGIOGRAPHY;  Surgeon: Nelva Bush, MD;  Location: [facility];  Service: Cardiovascular;  Laterality: N/A;   RIGHT/LEFT HEART CATH AND CORONARY ANGIOGRAPHY N/A 04/12/2021   Procedure: RIGHT/LEFT HEART CATH AND CORONARY ANGIOGRAPHY;  Surgeon: Nelva Bush, MD;  Location: [place] [facility];  Service: Cardiovascular;  Laterality: N/A;   Patient Active Problem List   Diagnosis Date Noted   Anemia    Leukocytosis    Right arm weakness 01/25/2022   Right sided weakness    Fall at home 01/22/2022   Status post biventricular pacemaker - MRI compatible PPM and leads 01/22/2022   Chronic indwelling Foley catheter 01/22/2022   CKD (chronic kidney disease), stage III - IV ([place]) 06/28/2021   Chronic HFrEF (heart failure with reduced ejection fraction) ([place]) 03/31/2021   Arrhythmia    DM (diabetes mellitus), type 2 ([place]) new 10/26/2020   Urinary retention 10/26/2020   Hyperlipidemia LDL goal <70 10/26/2020   Coronary artery disease of native artery of native heart with stable angina pectoris ([place]) 10/26/2020   S/P angioplasty with stent 10/04/20 DES to LCX  10/26/2020   Ischemic cardiomyopathy 10/26/2020   Diabetes mellitus, type 2 ([place]) 10/06/2020   DOE (dyspnea on exertion)    Meningioma ([place]) 08/14/2020   Vestibular schwannoma ([place]) 08/14/2020   Cardiac arrest with ventricular fibrillation (HCC)    Pain of left hip joint 05/29/2020   Benign brain tumor ([place])    Chronic kidney disease    History of  pulmonary embolus (PE)    Hypertension    Nonsustained ventricular tachycardia ([place]) 10/11/2019   Dizziness 10/11/2019   Abnormal echocardiogram 06/07/2019   Pulmonary hypertension due to thromboembolism ([place]) 03/09/2018   Solitary pulmonary nodule on lung CT 03/08/2018   Ascending aortic aneurysm (HCC) 4.2 cm based on CT from December 2020 01/29/2018   Sinus bradycardia 12/22/2017   Left bundle branch block 12/19/2017   Personal history of pulmonary embolism - no longer on anticoagulation due to pt refusal to take it anymore. 12/19/2017   Chronic kidney disease, stage III (moderate) ([place]) 12/19/2017   Benign neoplasm of brain ([place]) 12/19/2017    PCP: Cari Caraway, MD   REFERRING PROVIDER: Cari Caraway, MD   REFERRING DIAG: Falls, unsteady gait, mm weakness Rationale for Evaluation and Treatment Rehabilitation  THERAPY DIAG:  Unsteadiness on feet  Right  sided weakness  Repeated falls  Muscle weakness (generalized)  Meningioma ([place])  ONSET DATE: 05/10/2022   SUBJECTIVE:                                                                                                                                                                                           SUBJECTIVE STATEMENT: Patient reports that his eye Dr appointment was cancelled and re-scheduled for March 4th. Its not the actually surgery, but a consult regarding what needs to be done.  He is having more unsteadiness. He is completely unstable in the dark, but also more unsteady during gait. No falls.  PERTINENT HISTORY:  Brief HPI:   Kate Stoneburner is a 77 y.o. male who fell backward striking his head and right neck shoulder area on 01/21/2022. He complained of right hemiparesis. CT head revealed left parafalcine meningioma.Started on Decadron with improvement in symptoms   PAIN:  Are you having pain? no PRECAUTIONS: ICD/Pacemaker  WEIGHT BEARING RESTRICTIONS No  FALLS:  Has patient fallen in last 6 months?  Yes. Number of falls 2  LIVING ENVIRONMENT: Lives with: lives with their spouse Lives in: House/apartment Stairs: Yes: External: 7 steps; bilateral but cannot reach both Has following equipment at home: Walker - 2 wheeled and a cane, shower seat  OCCUPATION: Retired  PLOF: Independent  PATIENT GOALS Feel normal with standing and functioning.  Feel stronger and have better balance   OBJECTIVE:   DIAGNOSTIC FINDINGS:  CT Lumbar- 3. Moderate degenerative spinal stenosis at L2-3. CT CERVICAL IMPRESSION: 1. No evidence of intracranial injury. 2. Negative for cervical spine fracture but there is prevertebral edema at C4-5 suggesting soft tissue injury. 3. Meningiomas with left cerebral of edema, reference brain MRI 01/22/2022.  SCREENING FOR RED FLAGS: Bowel or bladder incontinence: No Spinal tumors: No Cauda equina syndrome: No Compression fracture: No Abdominal aneurysm: Yes: Abdominal aortic aneurysm, stable  COGNITION:  Overall cognitive status: WFL     SENSATION: WFL Patient reports tingling in B hands, all over.  MUSCLE LENGTH: Hamstrings: approximately 55 degrees B.  Was 70 degrees in September 2023  POSTURE: rounded shoulders and forward head  PALPATION: No TTP in R shoulder or up traps. B up traps tight, low back TTP R and L proximal to Illiac crest  LUMBAR ROM:  AROM limited in all directions, but he lost his balance frequently, so accuracy of testing limited.   LOWER EXTREMITY ROM:      General mild stiffness throughout BLE with PROM.  LOWER EXTREMITY MMT:   MMT Right eval Left eval  Hip flexion 4- 4-  Hip extension    Hip abduction  4 4  Hip adduction    Hip internal rotation    Hip external rotation    Knee flexion 4+ 4-  Knee extension 4+ 4-  Ankle dorsiflexion 4 4  Ankle plantarflexion    Ankle inversion    Ankle eversion     (Blank rows = not tested)  LUMBAR SPECIAL TESTS:  Slump test: Positive R  Shoulder Special Tests empty can  test (+) R, Hawkins-Kennedy test (+) R. Previous MRI reveals full tear of supraspinatus and partial tear of infraspinatus.  FUNCTIONAL TESTS:  5 times sit to stand: 30 seconds struggled with weak knees Timed up and go (TUG): 27 seconds with SPC   TUG 10/12/22 = 13 seconds without device  Functional gait assessment: not tested BERG balance test:  23/56, 10/12/22 =29/56  GAIT: Distance walked: 80 Assistive device utilized: SPC, very unsteady used walls to stabilize, knees very shaky and unsteady, needing CGA to min A Level of assistance: CGA to min A for LOB Comments: Patient was unsteady with turns, relied on UE support for balance., legs shaky, presents like he has neuropathy but he does not, also reports double vision    TODAY'S TREATMENT   11/03/22 NuStep L5 x 6 minutes. Proprioceptive training on Air ex-Static standing, then ant/post and lateral weight shifts. Shaky throughout, but no true LOB. Seated ankle strength and stability exercises-inversion/ball squeeze, eversion, DF against G tband, and PF against Black Tband, 10 x 5 sec each exer, B. Standing rocker board in the parallel bars, very shaky, difficult to weight shift both ant/post and lat. Ambulation with head turns, walking backwards, high steps. Unsteady, occasional min A for balance.   10/27/22 NuStep L5 x 6 minutes, U and LE, then attempted 30 sec at L8 for strength, legs only. Patient reported R knee pain, so tried to decrease resistance to L7, still had pain, so deferred. Assessed for ITB tightness, with pain noted mid thigh and proximally, with tightness. STM to R ITB F/B stretch to ITB and R hip as well. Sit to/from stand from mat with emphasis on slow decent x 10. Seated knee flex, BLE 35#, 2 x 10 Seated eccentric knee ext, 10#, 1 x 10 with each Upside down BOSU in parallel bars. Attempting static balance, 2 minutes. Max of ~10 sec. Wall bumps started at 4", progressed to approximately 12". Very stable and controlled  in posterior weight shifts.  10/25/22 Nustep level 5 x 6 mintues Leg curls 35# 2x15 10# 2x10 leg extension 5# straight arm pulls on and off airex with CGA Sit to stand with elevated surface holding yellow weighted ball In pbars side stepping on airex balance beam In pbars marching, hip abduction and extension Walking direction changes Volleyball Tandem walk with min A  10/21/22 NuStep L5 x 6 minutes Practiced sit<> stand, controlling decent x 8 Leg press, 10 reps with BLE, 30#, ULE 2 x 5 with 20#, emphasizing eccentric control, needed A to RLE. Seated knee flexion, 55#, BLE, 2 x 10 reps Pedalled bike at min 85 rpm, 3 x 30 sec with L2 resistance. Able to maintain > 90 rpm on last 2 rounds. Sit to and from stand on air ex pad x 8 reps, emphasizing controlled decent, mod unsteadiness, but no LOB noted.  10/19/22 NuStep L5 x 6 minutes Supine chin tucks against towel, 10 x 5 sec Supine cervical rotation to each side. Had to pause and hold each time he turned to avoid dizziness Sit to stand from elevated mat, on Air  ex pad, no UE support, x 10- very challenging. Stand on upside down BOSU ball in parallel bars, balance with no UE support. Very wobbly, but able to hold balance for up to 5 seconds. SLS at side of steps, lifting 1 foot up, over 8" object, back and forth. Required UUE support, so adjusted to flat object and he was able to perform without UE support, unsteady at times, CGA Alternate taps on 4" step without UE support, CGA, unsteady, but no LOB.   10/12/22 Nustep level 5 x 6 minutes [place]es on computer tables and reaching side to side Step reach side to side with same arm and opposite [employer] TUG/BERG assessed STM to the left neck, some isometric cervical stretches, Contract relax side bending and rotation  10/10/22 Nustep level 5 x 6 minutes Standing ball toss Cone reach side to side, then with a step Cone reach with full front to back with a steo Sit to stand  from plinth on airex Cone pick ups from low to high Tried power moves with stepping and rotation difficult Back to wall butt and scap touches  Front to wall belly and head touches High knee marches Direction changes Side step reaches  10/06/22 NuStep L4 x 6 minutes B side step onto/off air ex at rail for needed UE support Stand with R leg on air ex, rotate to the L and back, with occasional UE support, repeat x 10 each direction. SLS lifting opp leg up and over cone on floor, required UUE support, 12 reps each leg Crossover stepping in parallel bars 3 x each direction, BUE support Back crossover in parallel bars x 3 with BUE support Gait with 3# weights in each hand, 120' with a U turn in the middle, improved stability noted Floor ladder work. Stepping in all directions upon therapist command, CGA, 1 episode of balance loss Walk through floor ladder with weights, then without. Mild unsteadiness either way, poor coordination. Shoulder ext with 15#, 10 reps Paloff press 10 reps, 15# each direction.  09/29/22 Nustep level 4 x 6 minutes Bike Level 3 x 6 minutes On foot on airex standing balance Alternating 4" toe touches with using 1 and 2 canes In pbars side stepping walking FWD/BKWD Bend over and touch cone, difficulty coming up. Head turns while standing  PATIENT EDUCATION:  Education details: POC,  Person educated: Patient and Spouse Education method: Explanation Education comprehension: verbalized understanding   HOME EXERCISE PROGRAM: Access Code: [email or web address] URL: [email or web address] Date: 10/12/2022 Prepared by: Lum Babe  Exercises - Seated Isometric Cervical Sidebending  - 1 x daily - 7 x weekly - 2 sets - 10 reps - 3 hold - Seated Isometric Cervical Rotation  - 1 x daily - 7 x weekly - 2 sets - 10 reps - 3 hold  [id number]   ASSESSMENT:  CLINICAL IMPRESSION: Patient reports no. He did not have his eye surgery. The appointment was re-scheduled  for 3/4. It is not a surgery appointment, just an investigational to determine what needs to be done. In the meantime he reports increased unsteadiness. Treatment focused on ankle stability and proprioception/balance today. He is very shaky, but did seem to improve slightly with coordination with treatment activities.  OBJECTIVE IMPAIRMENTS Abnormal gait, decreased activity tolerance, decreased balance, decreased cognition, decreased coordination, difficulty walking, decreased ROM, decreased strength, increased muscle spasms, impaired flexibility, postural dysfunction, and pain.   ACTIVITY LIMITATIONS carrying, lifting, bending, standing, squatting, stairs, reach over head, and locomotion level  PARTICIPATION LIMITATIONS: cleaning,  laundry, driving, shopping, and community activity  PERSONAL FACTORS Age and 1 comorbidity: multiple meningiomas  are also affecting patient's functional outcome.   REHAB POTENTIAL: Good  CLINICAL DECISION MAKING: Evolving/moderate complexity  EVALUATION COMPLEXITY: Moderate   GOALS: Goals reviewed with patient? Yes  SHORT TERM GOALS: Target date: 10/04/22  I with initial HEP Baseline: Goal status: 10/21/22-met  LONG TERM GOALS: Target date: 12/20/22  I with final HEP Baseline:  Goal status: ongoing  2.  Patient will have no falls over a 6 week period Baseline: 2 falls  Goal status: 10/21/22, no falls in 2 weeks, ongoing  3.  Complete 5 x STS in <12 sec to demonstrate improved LE strength Baseline: 30 seconds Goal status: ongoing  4.  Patient will complete TUG in < 12 sec with or without AD Baseline: 27 seconds Goal status: progressing  5.  Patient will score at least 43/56 on Berg balance Baseline: 23/56 Goal status: progressing  6.  Patient will walk at least 600' with LRAD, MI on level an unlevel surfaces, back pain < 3/10 Baseline: 80', unsteady, SPC with CGA/Min A Goal status: progressing   PLAN: PT FREQUENCY: 2x/week  PT DURATION: 12  weeks  PLANNED INTERVENTIONS: Therapeutic exercises, Therapeutic activity, Neuromuscular re-education, Balance training, Gait training, Patient/Family education, Self Care, Joint mobilization, Stair training, Dry Needling, Electrical stimulation, Cryotherapy, Moist heat, Ultrasound, Ionotophoresis 65m/ml Dexamethasone, and Manual therapy.  PLAN FOR NEXT SESSION:   continue with the balance can work on the neck, eccentric quad control  SEthel RanaDPT 11/03/22 12:27 PM

## 2022-11-08 ENCOUNTER — Ambulatory Visit: Payer: Medicare Other

## 2022-11-08 ENCOUNTER — Encounter: Payer: Self-pay | Admitting: Physical Therapy

## 2022-11-08 ENCOUNTER — Ambulatory Visit: Payer: Medicare Other | Admitting: Physical Therapy

## 2022-11-08 DIAGNOSIS — R531 Weakness: Secondary | ICD-10-CM | POA: Diagnosis not present

## 2022-11-08 DIAGNOSIS — I255 Ischemic cardiomyopathy: Secondary | ICD-10-CM | POA: Diagnosis not present

## 2022-11-08 DIAGNOSIS — M6281 Muscle weakness (generalized): Secondary | ICD-10-CM | POA: Diagnosis not present

## 2022-11-08 DIAGNOSIS — R278 Other lack of coordination: Secondary | ICD-10-CM | POA: Diagnosis not present

## 2022-11-08 DIAGNOSIS — R2681 Unsteadiness on feet: Secondary | ICD-10-CM

## 2022-11-08 DIAGNOSIS — R296 Repeated falls: Secondary | ICD-10-CM

## 2022-11-08 DIAGNOSIS — D329 Benign neoplasm of meninges, unspecified: Secondary | ICD-10-CM | POA: Diagnosis not present

## 2022-11-08 LAB — CUP PACEART REMOTE DEVICE CHECK
Battery Remaining Longevity: 104 mo
Battery Voltage: 3.02 V
Brady Statistic AP VP Percent: 7.97 %
Brady Statistic AP VS Percent: 0.04 %
Brady Statistic AS VP Percent: 90.29 %
Brady Statistic AS VS Percent: 1.7 %
Brady Statistic RA Percent Paced: 8 %
Brady Statistic RV Percent Paced: 84.37 %
Date Time Interrogation Session: 20240227022827
HighPow Impedance: 62 Ohm
Implantable Lead Connection Status: 753985
Implantable Lead Connection Status: 753985
Implantable Lead Connection Status: 753985
Implantable Lead Implant Date: 20230222
Implantable Lead Implant Date: 20230222
Implantable Lead Implant Date: 20230222
Implantable Lead Location: 753858
Implantable Lead Location: 753859
Implantable Lead Location: 753860
Implantable Lead Model: 4798
Implantable Lead Model: 5076
Implantable Pulse Generator Implant Date: 20230222
Lead Channel Impedance Value: 212.8 Ohm
Lead Channel Impedance Value: 212.8 Ohm
Lead Channel Impedance Value: 220.723
Lead Channel Impedance Value: 237.12 Ohm
Lead Channel Impedance Value: 237.12 Ohm
Lead Channel Impedance Value: 399 Ohm
Lead Channel Impedance Value: 399 Ohm
Lead Channel Impedance Value: 456 Ohm
Lead Channel Impedance Value: 456 Ohm
Lead Channel Impedance Value: 494 Ohm
Lead Channel Impedance Value: 532 Ohm
Lead Channel Impedance Value: 627 Ohm
Lead Channel Impedance Value: 627 Ohm
Lead Channel Impedance Value: 722 Ohm
Lead Channel Impedance Value: 760 Ohm
Lead Channel Impedance Value: 779 Ohm
Lead Channel Impedance Value: 817 Ohm
Lead Channel Impedance Value: 855 Ohm
Lead Channel Pacing Threshold Amplitude: 0.5 V
Lead Channel Pacing Threshold Amplitude: 0.625 V
Lead Channel Pacing Threshold Amplitude: 1.125 V
Lead Channel Pacing Threshold Pulse Width: 0.4 ms
Lead Channel Pacing Threshold Pulse Width: 0.4 ms
Lead Channel Pacing Threshold Pulse Width: 0.4 ms
Lead Channel Sensing Intrinsic Amplitude: 0.875 mV
Lead Channel Sensing Intrinsic Amplitude: 0.875 mV
Lead Channel Sensing Intrinsic Amplitude: 18.5 mV
Lead Channel Sensing Intrinsic Amplitude: 18.5 mV
Lead Channel Setting Pacing Amplitude: 1.5 V
Lead Channel Setting Pacing Amplitude: 1.75 V
Lead Channel Setting Pacing Amplitude: 2 V
Lead Channel Setting Pacing Pulse Width: 0.4 ms
Lead Channel Setting Pacing Pulse Width: 0.4 ms
Lead Channel Setting Sensing Sensitivity: 0.3 mV
Zone Setting Status: 755011
Zone Setting Status: 755011

## 2022-11-08 NOTE — Therapy (Signed)
OUTPATIENT PHYSICAL THERAPY THORACOLUMBAR TREATMENT Progress Note Reporting Period 09/20/22 to 10/25/22  See note below for Objective Data and Assessment of Progress/Goals.      Patient Name: Timothy Moran MRN: [id number] DOB:12-02-1945, 77 y.o., male Today's Date: 11/08/2022   PT End of Session - 11/08/22 1103     Visit Number 13    Date for PT Re-Evaluation 12/20/22    Authorization Type Medicare    PT Start Time 1100    PT Stop Time 1145    PT Time Calculation (min) 45 min    Equipment Utilized During Treatment Gait belt    Activity Tolerance Patient tolerated treatment well    Behavior During Therapy [facility] for tasks assessed/performed               Past Medical History:  Diagnosis Date   Acute blood loss anemia 10/05/2020   Acute respiratory failure ([place]), hypothermia therapy, vent - extubated 10/06/20    AKI (acute kidney injury) ([place]) 10/05/2020   Anoxic brain injury ([place]) 10/26/2020   Arrhythmia    Ascending aortic aneurysm ([place]) 01/29/2018   a. 2019 4.3 cm by echo; b. 02/2021 Echo: Ao root 69m.   [facility]    Benign neoplasm of brain ([facility] 12/19/2017   Cardiac arrest with ventricular fibrillation ([place] [facility]    Chest pain 12/04/2017   Chronic kidney disease, stage III (moderate) ([place] 12/19/2017   CKD (chronic kidney disease), stage III - IV (HCC)    Coronary Artery Disease    a. 10000000Inf STEMI complicated by cardiac arrest/PCI: LAD 50p/m, LCX 100d (2.5 x 30 Resolute Onyx DES); b. 04/2021 PCI: 04/2021 LM nl, LAD 50p/814m2.5x30 Onyx Frontier DES), LCX 25p, 5012mFR 0.98), 40d ISR (RFR 0.98), OM2 25, RCA mild diff dzs.   Diabetes mellitus, type 2 ([facility]CC[facility]1/25/2022   10/06/20 A1C 7%   Dizziness 10/11/2019   Encounter for imaging study to confirm orogastric (OG) tube placement    Hematuria 10/26/2020   HFrEF (heart failure with reduced ejection fraction) ([facility]  a. 11/2020 Echo: EF 25-30%; b. 02/2021 Echo: EF 25-30%, glob HK. Mild LVH. Nl RV fxn. Triv  AI. Ao root 10m28m History of pulmonary embolus (PE)    HLD (hyperlipidemia) 10/26/2020   Hypertension    Iron deficiency anemia rec'd IV iron 10/26/2020   Ischemic cardiomyopathy    a. 11/2020 Echo: EF 25-30%; b. 02/2021 Echo: EF 25-30%, glob HK.   Left bundle branch block 12/19/2017   Meningioma (HCC)[place]/11/2019   Nonsustained ventricular tachycardia (HCC)[place]/29/2021   Pain of left hip joint 05/29/2020   Personal history of pulmonary embolism 12/19/2017   Pneumonia of both lungs due to infectious organism    Pulmonary hypertension due to thromboembolism (HCC)[place]/28/2019   See echo 12/27/17 with nl PAS vs CTa chest 02/23/18    S/P angioplasty with stent 10/04/20 DES to LCX  10/26/2020   Sinus bradycardia 12/22/2017   SOB (shortness of breath)    Solitary pulmonary nodule on lung CT 03/08/2018   CT 12/04/17 1.0 x 0.8 x 0.7 cm nodular opacity in the RUL vs not seen 03/16/10 posterior segment of the right upper lobe. .SpiMarland Kitchenometry 03/08/2018    FEV1 3.86 (108%)  Ratio 96 s prior rx  - PET  03/13/18   Low grade c/w adenoca > rec T surgery eval     STEMI (ST elevation myocardial infarction) (HCC)[place]/23/2022   Urinary retention 10/26/2020   Vestibular schwannoma (HCC)[place]/11/2019  Past Surgical History:  Procedure Laterality Date   APPENDECTOMY     BIV ICD INSERTION CRT-D N/A 11/03/2021   Procedure: BIV ICD INSERTION CRT-D;  Surgeon: Constance Haw, MD;  Location: [place] [facility];  Service: Cardiovascular;  Laterality: N/A;   CARDIAC CATHETERIZATION     CORONARY ANGIOPLASTY WITH STENT PLACEMENT Left 04/12/2021   LAD stent placement   CORONARY STENT INTERVENTION N/A 04/12/2021   Procedure: CORONARY STENT INTERVENTION;  Surgeon: Nelva Bush, MD;  Location: [place] [facility];  Service: Cardiovascular;  Laterality: N/A;   CORONARY/GRAFT ACUTE MI REVASCULARIZATION N/A 10/04/2020   Procedure: Coronary/Graft Acute MI Revascularization;  Surgeon: Nelva Bush, MD;  Location: [place] [facility]  [facility];  Service: Cardiovascular;  Laterality: N/A;   HERNIA REPAIR     INTRAVASCULAR PRESSURE WIRE/FFR STUDY N/A 04/12/2021   Procedure: INTRAVASCULAR PRESSURE WIRE/FFR STUDY;  Surgeon: Nelva Bush, MD;  Location: [place] [facility];  Service: Cardiovascular;  Laterality: N/A;   INTRAVASCULAR ULTRASOUND/IVUS N/A 04/12/2021   Procedure: Intravascular Ultrasound/IVUS;  Surgeon: Nelva Bush, MD;  Location: [place] [facility];  Service: Cardiovascular;  Laterality: N/A;   LEFT HEART CATH AND CORONARY ANGIOGRAPHY N/A 10/04/2020   Procedure: LEFT HEART CATH AND CORONARY ANGIOGRAPHY;  Surgeon: Nelva Bush, MD;  Location: [place] [facility];  Service: Cardiovascular;  Laterality: N/A;   RIGHT/LEFT HEART CATH AND CORONARY ANGIOGRAPHY N/A 04/12/2021   Procedure: RIGHT/LEFT HEART CATH AND CORONARY ANGIOGRAPHY;  Surgeon: Nelva Bush, MD;  Location: [place] [facility];  Service: Cardiovascular;  Laterality: N/A;   Patient Active Problem List   Diagnosis Date Noted   Anemia    Leukocytosis    Right arm weakness 01/25/2022   Right sided weakness    Fall at home 01/22/2022   Status post biventricular pacemaker - MRI compatible PPM and leads 01/22/2022   Chronic indwelling Foley catheter 01/22/2022   CKD (chronic kidney disease), stage III - IV ([place]) 06/28/2021   Chronic HFrEF (heart failure with reduced ejection fraction) ([place]) 03/31/2021   Arrhythmia    DM (diabetes mellitus), type 2 ([place]) new 10/26/2020   Urinary retention 10/26/2020   Hyperlipidemia LDL goal <70 10/26/2020   Coronary artery disease of native artery of native heart with stable angina pectoris ([place]) 10/26/2020   S/P angioplasty with stent 10/04/20 DES to LCX  10/26/2020   Ischemic cardiomyopathy 10/26/2020   Diabetes mellitus, type 2 ([place]) 10/06/2020   DOE (dyspnea on exertion)    Meningioma ([place]) 08/14/2020   Vestibular schwannoma ([place]) 08/14/2020   Cardiac arrest with ventricular fibrillation (HCC)    Pain of left hip  joint 05/29/2020   Benign brain tumor ([place])    Chronic kidney disease    History of pulmonary embolus (PE)    Hypertension    Nonsustained ventricular tachycardia ([place]) 10/11/2019   Dizziness 10/11/2019   Abnormal echocardiogram 06/07/2019   Pulmonary hypertension due to thromboembolism ([place]) 03/09/2018   Solitary pulmonary nodule on lung CT 03/08/2018   Ascending aortic aneurysm (HCC) 4.2 cm based on CT from December 2020 01/29/2018   Sinus bradycardia 12/22/2017   Left bundle branch block 12/19/2017   Personal history of pulmonary embolism - no longer on anticoagulation due to pt refusal to take it anymore. 12/19/2017   Chronic kidney disease, stage III (moderate) ([place]) 12/19/2017   Benign neoplasm of brain ([employer]) 12/19/2017    PCP: Cari Caraway, MD   REFERRING PROVIDER: Cari Caraway, MD   REFERRING DIAG: Falls, unsteady gait, mm weakness Rationale  for Evaluation and Treatment Rehabilitation  THERAPY DIAG:  Unsteadiness on feet  Right sided weakness  Repeated falls  Muscle weakness (generalized)  ONSET DATE: 05/10/2022   SUBJECTIVE:                                                                                                                                                                                           SUBJECTIVE STATEMENT: Reports that he thinks that he is doing maybe a little better but still stumbles and is unsteady,  Eye appointment is March 4th He is having more unsteadiness. He is completely unstable in the dark, but also more unsteady during gait. No falls.  PERTINENT HISTORY:  Brief HPI:   Burke Hazzard is a 77 y.o. male who fell backward striking his head and right neck shoulder area on 01/21/2022. He complained of right hemiparesis. CT head revealed left parafalcine meningioma.Started on Decadron with improvement in symptoms   PAIN:  Are you having pain? no PRECAUTIONS: ICD/Pacemaker  WEIGHT BEARING RESTRICTIONS No  FALLS:  Has  patient fallen in last 6 months? Yes. Number of falls 2  LIVING ENVIRONMENT: Lives with: lives with their spouse Lives in: House/apartment Stairs: Yes: External: 7 steps; bilateral but cannot reach both Has following equipment at home: Walker - 2 wheeled and a cane, shower seat  OCCUPATION: Retired  PLOF: Independent  PATIENT GOALS Feel normal with standing and functioning.  Feel stronger and have better balance   OBJECTIVE:   DIAGNOSTIC FINDINGS:  CT Lumbar- 3. Moderate degenerative spinal stenosis at L2-3. CT CERVICAL IMPRESSION: 1. No evidence of intracranial injury. 2. Negative for cervical spine fracture but there is prevertebral edema at C4-5 suggesting soft tissue injury. 3. Meningiomas with left cerebral of edema, reference brain MRI 01/22/2022.  SCREENING FOR RED FLAGS: Bowel or bladder incontinence: No Spinal tumors: No Cauda equina syndrome: No Compression fracture: No Abdominal aneurysm: Yes: Abdominal aortic aneurysm, stable  COGNITION:  Overall cognitive status: WFL     SENSATION: WFL Patient reports tingling in B hands, all over.  MUSCLE LENGTH: Hamstrings: approximately 55 degrees B.  Was 70 degrees in September 2023  POSTURE: rounded shoulders and forward head  PALPATION: No TTP in R shoulder or up traps. B up traps tight, low back TTP R and L proximal to Illiac crest  LUMBAR ROM:  AROM limited in all directions, but he lost his balance frequently, so accuracy of testing limited.   LOWER EXTREMITY ROM:      General mild stiffness throughout BLE with PROM.  LOWER EXTREMITY MMT:   MMT Right eval Left eval  Hip flexion 4- 4-  Hip  extension    Hip abduction 4 4  Hip adduction    Hip internal rotation    Hip external rotation    Knee flexion 4+ 4-  Knee extension 4+ 4-  Ankle dorsiflexion 4 4  Ankle plantarflexion    Ankle inversion    Ankle eversion     (Blank rows = not tested)  LUMBAR SPECIAL TESTS:  Slump test: Positive  R  Shoulder Special Tests empty can test (+) R, Hawkins-Kennedy test (+) R. Previous MRI reveals full tear of supraspinatus and partial tear of infraspinatus.  FUNCTIONAL TESTS:  5 times sit to stand: 30 seconds struggled with weak knees Timed up and go (TUG): 27 seconds with SPC   TUG 10/12/22 = 13 seconds without device  Functional gait assessment: not tested BERG balance test:  23/56, 10/12/22 =29/56  GAIT: Distance walked: 80 Assistive device utilized: SPC, very unsteady used walls to stabilize, knees very shaky and unsteady, needing CGA to min A Level of assistance: CGA to min A for LOB Comments: Patient was unsteady with turns, relied on UE support for balance., legs shaky, presents like he has neuropathy but he does not, also reports double vision    TODAY'S TREATMENT  11/08/22 Nustep level 5 x 6 minutes Direction changes Walking figure 8's on the mat Walking across the mat without changing speed Direction changes In pbars 2.5# hip march, abduction and extension Side stepping on the airex balance beam On airex back to wall bottom touches and shoulder blade touches to the wall for balance and hip strategies On airex head turns Holding cane 12" toe touches and then cone toe touches volleyball  20# farmer carry Side stepping over 3 sticks  11/03/22 NuStep L5 x 6 minutes. Proprioceptive training on Air ex-Static standing, then ant/post and lateral weight shifts. Shaky throughout, but no true LOB. Seated ankle strength and stability exercises-inversion/ball squeeze, eversion, DF against G tband, and PF against Black Tband, 10 x 5 sec each exer, B. Standing rocker board in the parallel bars, very shaky, difficult to weight shift both ant/post and lat. Ambulation with head turns, walking backwards, high steps. Unsteady, occasional min A for balance.   10/27/22 NuStep L5 x 6 minutes, U and LE, then attempted 30 sec at L8 for strength, legs only. Patient reported R knee pain, so  tried to decrease resistance to L7, still had pain, so deferred. Assessed for ITB tightness, with pain noted mid thigh and proximally, with tightness. STM to R ITB F/B stretch to ITB and R hip as well. Sit to/from stand from mat with emphasis on slow decent x 10. Seated knee flex, BLE 35#, 2 x 10 Seated eccentric knee ext, 10#, 1 x 10 with each Upside down BOSU in parallel bars. Attempting static balance, 2 minutes. Max of ~10 sec. Wall bumps started at 4", progressed to approximately 12". Very stable and controlled in posterior weight shifts.  10/25/22 Nustep level 5 x 6 mintues Leg curls 35# 2x15 10# 2x10 leg extension 5# straight arm pulls on and off airex with CGA Sit to stand with elevated surface holding yellow weighted ball In pbars side stepping on airex balance beam In pbars marching, hip abduction and extension Walking direction changes Volleyball Tandem walk with min A  10/21/22 NuStep L5 x 6 minutes Practiced sit<> stand, controlling decent x 8 Leg press, 10 reps with BLE, 30#, ULE 2 x 5 with 20#, emphasizing eccentric control, needed A to RLE. Seated knee flexion, 55#, BLE, 2 x  10 reps Pedalled bike at min 85 rpm, 3 x 30 sec with L2 resistance. Able to maintain > 90 rpm on last 2 rounds. Sit to and from stand on air ex pad x 8 reps, emphasizing controlled decent, mod unsteadiness, but no LOB noted.  10/19/22 NuStep L5 x 6 minutes Supine chin tucks against towel, 10 x 5 sec Supine cervical rotation to each side. Had to pause and hold each time he turned to avoid dizziness Sit to stand from elevated mat, on Air ex pad, no UE support, x 10- very challenging. Stand on upside down BOSU ball in parallel bars, balance with no UE support. Very wobbly, but able to hold balance for up to 5 seconds. SLS at side of steps, lifting 1 foot up, over 8" object, back and forth. Required UUE support, so adjusted to flat object and he was able to perform without UE support, unsteady at times,  CGA Alternate taps on 4" step without UE support, CGA, unsteady, but no LOB.   10/12/22 Nustep level 5 x 6 minutes Cones on computer tables and reaching side to side Step reach side to side with same arm and opposite [employer] TUG/BERG assessed STM to the left neck, some isometric cervical stretches, Contract relax side bending and rotation  10/10/22 Nustep level 5 x 6 minutes Standing ball toss Cone reach side to side, then with a step Cone reach with full front to back with a steo Sit to stand from plinth on airex Cone pick ups from low to high Tried power moves with stepping and rotation difficult Back to wall butt and scap touches  Front to wall belly and head touches High knee marches Direction changes Side step reaches  PATIENT EDUCATION:  Education details: POC,  Person educated: Patient and Spouse Education method: Explanation Education comprehension: verbalized understanding   HOME EXERCISE PROGRAM: Access Code: [email or web address] URL: [email or web address] Date: 10/12/2022 Prepared by: Lum Babe  Exercises - Seated Isometric Cervical Sidebending  - 1 x daily - 7 x weekly - 2 sets - 10 reps - 3 hold - Seated Isometric Cervical Rotation  - 1 x daily - 7 x weekly - 2 sets - 10 reps - 3 hold  [email or web address]   ASSESSMENT:  CLINICAL IMPRESSION: Patient continues to wait for the eye surgery hopefully next week, I did notice some depth perception issues with the 12" toe touches.  He really struggled with the mat walking.  When he is moving he has less issue than when he is just getting up or standing still  OBJECTIVE IMPAIRMENTS Abnormal gait, decreased activity tolerance, decreased balance, decreased cognition, decreased coordination, difficulty walking, decreased ROM, decreased strength, increased muscle spasms, impaired flexibility, postural dysfunction, and pain.   ACTIVITY LIMITATIONS carrying, lifting, bending, standing, squatting, stairs,  reach over head, and locomotion level  PARTICIPATION LIMITATIONS: cleaning, laundry, driving, shopping, and community activity  PERSONAL FACTORS Age and 1 comorbidity: multiple meningiomas  are also affecting patient's functional outcome.   REHAB POTENTIAL: Good  CLINICAL DECISION MAKING: Evolving/moderate complexity  EVALUATION COMPLEXITY: Moderate   GOALS: Goals reviewed with patient? Yes  SHORT TERM GOALS: Target date: 10/04/22  I with initial HEP Baseline: Goal status: 10/21/22-met  LONG TERM GOALS: Target date: 12/20/22  I with final HEP Baseline:  Goal status: ongoing  2.  Patient will have no falls over a 6 week period Baseline: 2 falls  Goal status:met 11/08/22  3.  Complete 5 x STS in <12 sec to demonstrate improved LE  strength Baseline: 30 seconds Goal status: ongoing  4.  Patient will complete TUG in < 12 sec with or without AD Baseline: 27 seconds Goal status: progressing  5.  Patient will score at least 43/56 on Berg balance Baseline: 23/56 Goal status: progressing  6.  Patient will walk at least 600' with LRAD, MI on level an unlevel surfaces, back pain < 3/10 Baseline: 80', unsteady, SPC with CGA/Min A Goal status: progressing   PLAN: PT FREQUENCY: 2x/week  PT DURATION: 12 weeks  PLANNED INTERVENTIONS: Therapeutic exercises, Therapeutic activity, Neuromuscular re-education, Balance training, Gait training, Patient/Family education, Self Care, Joint mobilization, Stair training, Dry Needling, Electrical stimulation, Cryotherapy, Moist heat, Ultrasound, Ionotophoresis '4mg'$ /ml Dexamethasone, and Manual therapy.  PLAN FOR NEXT SESSION:   continue with the balance can work on the neck, eccentric quad control  Lum Babe, PT 11/08/22 11:04 AM

## 2022-11-10 ENCOUNTER — Ambulatory Visit: Payer: Medicare Other | Admitting: Physical Therapy

## 2022-11-10 ENCOUNTER — Encounter: Payer: Self-pay | Admitting: Physical Therapy

## 2022-11-10 DIAGNOSIS — R296 Repeated falls: Secondary | ICD-10-CM | POA: Diagnosis not present

## 2022-11-10 DIAGNOSIS — R278 Other lack of coordination: Secondary | ICD-10-CM | POA: Diagnosis not present

## 2022-11-10 DIAGNOSIS — D329 Benign neoplasm of meninges, unspecified: Secondary | ICD-10-CM | POA: Diagnosis not present

## 2022-11-10 DIAGNOSIS — I5022 Chronic systolic (congestive) heart failure: Secondary | ICD-10-CM | POA: Diagnosis not present

## 2022-11-10 DIAGNOSIS — M6281 Muscle weakness (generalized): Secondary | ICD-10-CM

## 2022-11-10 DIAGNOSIS — R531 Weakness: Secondary | ICD-10-CM

## 2022-11-10 DIAGNOSIS — I10 Essential (primary) hypertension: Secondary | ICD-10-CM | POA: Diagnosis not present

## 2022-11-10 DIAGNOSIS — I25118 Atherosclerotic heart disease of native coronary artery with other forms of angina pectoris: Secondary | ICD-10-CM | POA: Diagnosis not present

## 2022-11-10 DIAGNOSIS — R2681 Unsteadiness on feet: Secondary | ICD-10-CM

## 2022-11-10 DIAGNOSIS — N1832 Chronic kidney disease, stage 3b: Secondary | ICD-10-CM | POA: Diagnosis not present

## 2022-11-10 DIAGNOSIS — N401 Enlarged prostate with lower urinary tract symptoms: Secondary | ICD-10-CM | POA: Diagnosis not present

## 2022-11-10 NOTE — Therapy (Signed)
OUTPATIENT PHYSICAL THERAPY THORACOLUMBAR TREATMENT   Patient Name: Timothy Moran MRN: [id number] DOB:1946-05-10, 77 y.o., male Today's Date: 11/10/2022   PT End of Session - 11/10/22 1309     Visit Number 14    Date for PT Re-Evaluation 12/20/22    Authorization Type Medicare    PT Start Time 1307    PT Stop Time 1352    PT Time Calculation (min) 45 min    Equipment Utilized During Treatment Gait belt    Activity Tolerance Patient tolerated treatment well    Behavior During Therapy [facility] for tasks assessed/performed               Past Medical History:  Diagnosis Date   Acute blood loss anemia 10/05/2020   Acute respiratory failure ([place]), hypothermia therapy, vent - extubated 10/06/20    AKI (acute kidney injury) ([place]) 10/05/2020   Anoxic brain injury ([place]) 10/26/2020   Arrhythmia    Ascending aortic aneurysm ([place]) 01/29/2018   a. 2019 4.3 cm by echo; b. 02/2021 Echo: Ao root 19m.   Benign brain tumor ([place]    Benign neoplasm of brain ([place] 12/19/2017   Cardiac arrest with ventricular fibrillation ([place]    Chest pain 12/04/2017   Chronic kidney disease, stage III (moderate) ([place] 12/19/2017   CKD (chronic kidney disease), stage III - IV (HCC)    Coronary Artery Disease    a. 10000000Inf STEMI complicated by cardiac arrest/PCI: LAD 50p/m, LCX 100d (2.5 x 30 Resolute Onyx DES); b. 04/2021 PCI: 04/2021 LM nl, LAD 50p/814m2.5x30 Onyx Frontier DES), LCX 25p, 5074mFR 0.98), 40d ISR (RFR 0.98), OM2 25, RCA mild diff dzs.   Diabetes mellitus, type 2 ([place]1/25/2022   10/06/20 A1C 7%   Dizziness 10/11/2019   Encounter for imaging study to confirm orogastric (OG) tube placement    Hematuria 10/26/2020   HFrEF (heart failure with reduced ejection fraction) ([place]  a. 11/2020 Echo: EF 25-30%; b. 02/2021 Echo: EF 25-30%, glob HK. Mild LVH. Nl RV fxn. Triv AI. Ao root 5m23m History of pulmonary embolus (PE)    HLD (hyperlipidemia) 10/26/2020   Hypertension    Iron deficiency  anemia rec'd IV iron 10/26/2020   Ischemic cardiomyopathy    a. 11/2020 Echo: EF 25-30%; b. 02/2021 Echo: EF 25-30%, glob HK.   Left bundle branch block 12/19/2017   Meningioma (HCC)[place]/11/2019   Nonsustained ventricular tachycardia (HCC)[place]/29/2021   Pain of left hip joint 05/29/2020   Personal history of pulmonary embolism 12/19/2017   Pneumonia of both lungs due to infectious organism    Pulmonary hypertension due to thromboembolism (HCC)[place]/28/2019   See echo 12/27/17 with nl PAS vs CTa chest 02/23/18    S/P angioplasty with stent 10/04/20 DES to LCX  10/26/2020   Sinus bradycardia 12/22/2017   SOB (shortness of breath)    Solitary pulmonary nodule on lung CT 03/08/2018   CT 12/04/17 1.0 x 0.8 x 0.7 cm nodular opacity in the RUL vs not seen 03/16/10 posterior segment of the right upper lobe. .Spi[place] Kitchenometry 03/08/2018    FEV1 3.86 (108%)  Ratio 96 s prior rx  - PET  03/13/18   Low grade c/w adenoca > rec T surgery eval     STEMI (ST elevation myocardial infarction) (HCC)[place]/23/2022   Urinary retention 10/26/2020   Vestibular schwannoma (HCC)[place]/11/2019   Past Surgical History:  Procedure Laterality Date   APPENDECTOMY     BIV ICD INSERTION CRT-D N/A  11/03/2021   Procedure: BIV ICD INSERTION CRT-D;  Surgeon: Constance Haw, MD;  Location: [place] [facility];  Service: Cardiovascular;  Laterality: N/A;   CARDIAC CATHETERIZATION     CORONARY ANGIOPLASTY WITH STENT PLACEMENT Left 04/12/2021   LAD stent placement   CORONARY STENT INTERVENTION N/A 04/12/2021   Procedure: CORONARY STENT INTERVENTION;  Surgeon: Nelva Bush, MD;  Location: [place] [facility];  Service: Cardiovascular;  Laterality: N/A;   CORONARY/GRAFT ACUTE MI REVASCULARIZATION N/A 10/04/2020   Procedure: Coronary/Graft Acute MI Revascularization;  Surgeon: Nelva Bush, MD;  Location: [place] [facility];  Service: Cardiovascular;  Laterality: N/A;   HERNIA REPAIR     INTRAVASCULAR PRESSURE WIRE/FFR STUDY N/A 04/12/2021    Procedure: INTRAVASCULAR PRESSURE WIRE/FFR STUDY;  Surgeon: Nelva Bush, MD;  Location: [place] [facility];  Service: Cardiovascular;  Laterality: N/A;   INTRAVASCULAR ULTRASOUND/IVUS N/A 04/12/2021   Procedure: Intravascular Ultrasound/IVUS;  Surgeon: Nelva Bush, MD;  Location: [place] [facility];  Service: Cardiovascular;  Laterality: N/A;   LEFT HEART CATH AND CORONARY ANGIOGRAPHY N/A 10/04/2020   Procedure: LEFT HEART CATH AND CORONARY ANGIOGRAPHY;  Surgeon: Nelva Bush, MD;  Location: [place] [facility];  Service: Cardiovascular;  Laterality: N/A;   RIGHT/LEFT HEART CATH AND CORONARY ANGIOGRAPHY N/A 04/12/2021   Procedure: RIGHT/LEFT HEART CATH AND CORONARY ANGIOGRAPHY;  Surgeon: Nelva Bush, MD;  Location: [place] [facility];  Service: Cardiovascular;  Laterality: N/A;   Patient Active Problem List   Diagnosis Date Noted   Anemia    Leukocytosis    Right arm weakness 01/25/2022   Right sided weakness    Fall at home 01/22/2022   Status post biventricular pacemaker - MRI compatible PPM and leads 01/22/2022   Chronic indwelling Foley catheter 01/22/2022   CKD (chronic kidney disease), stage III - IV ([place]) 06/28/2021   Chronic HFrEF (heart failure with reduced ejection fraction) ([place]) 03/31/2021   Arrhythmia    DM (diabetes mellitus), type 2 ([place]) new 10/26/2020   Urinary retention 10/26/2020   Hyperlipidemia LDL goal <70 10/26/2020   Coronary artery disease of native artery of native heart with stable angina pectoris ([place]) 10/26/2020   S/P angioplasty with stent 10/04/20 DES to LCX  10/26/2020   Ischemic cardiomyopathy 10/26/2020   Diabetes mellitus, type 2 ([place]) 10/06/2020   DOE (dyspnea on exertion)    Meningioma ([place]) 08/14/2020   Vestibular schwannoma ([place]) 08/14/2020   Cardiac arrest with ventricular fibrillation (HCC)    Pain of left hip joint 05/29/2020   Benign brain tumor ([place])    Chronic kidney disease    History of pulmonary embolus (PE)     Hypertension    Nonsustained ventricular tachycardia ([place]) 10/11/2019   Dizziness 10/11/2019   Abnormal echocardiogram 06/07/2019   Pulmonary hypertension due to thromboembolism ([place]) 03/09/2018   Solitary pulmonary nodule on lung CT 03/08/2018   Ascending aortic aneurysm (HCC) 4.2 cm based on CT from December 2020 01/29/2018   Sinus bradycardia 12/22/2017   Left bundle branch block 12/19/2017   Personal history of pulmonary embolism - no longer on anticoagulation due to pt refusal to take it anymore. 12/19/2017   Chronic kidney disease, stage III (moderate) ([place]) 12/19/2017   Benign neoplasm of brain ([place]) 12/19/2017    PCP: Cari Caraway, MD   REFERRING PROVIDER: Cari Caraway, MD   REFERRING DIAG: Falls, unsteady gait, mm weakness Rationale for Evaluation and Treatment Rehabilitation  THERAPY DIAG:  Unsteadiness on feet  Right sided weakness  Repeated falls  Muscle weakness (generalized)  ONSET DATE: 05/10/2022   SUBJECTIVE:                                                                                                                                                                                           SUBJECTIVE STATEMENT: No stumbles or falls, reports that he is fatigued when he leaves here.  PERTINENT HISTORY:  Brief HPI:   Gatlin Guthrie is a 77 y.o. male who fell backward striking his head and right neck shoulder area on 01/21/2022. He complained of right hemiparesis. CT head revealed left parafalcine meningioma.Started on Decadron with improvement in symptoms   PAIN:  Are you having pain? no PRECAUTIONS: ICD/Pacemaker  WEIGHT BEARING RESTRICTIONS No  FALLS:  Has patient fallen in last 6 months? Yes. Number of falls 2  LIVING ENVIRONMENT: Lives with: lives with their spouse Lives in: House/apartment Stairs: Yes: External: 7 steps; bilateral but cannot reach both Has following equipment at home: Walker - 2 wheeled and a cane, shower  seat  OCCUPATION: Retired  PLOF: Independent  PATIENT GOALS Feel normal with standing and functioning.  Feel stronger and have better balance   OBJECTIVE:   DIAGNOSTIC FINDINGS:  CT Lumbar- 3. Moderate degenerative spinal stenosis at L2-3. CT CERVICAL IMPRESSION: 1. No evidence of intracranial injury. 2. Negative for cervical spine fracture but there is prevertebral edema at C4-5 suggesting soft tissue injury. 3. Meningiomas with left cerebral of edema, reference brain MRI 01/22/2022.  SCREENING FOR RED FLAGS: Bowel or bladder incontinence: No Spinal tumors: No Cauda equina syndrome: No Compression fracture: No Abdominal aneurysm: Yes: Abdominal aortic aneurysm, stable  COGNITION:  Overall cognitive status: WFL     SENSATION: WFL Patient reports tingling in B hands, all over.  MUSCLE LENGTH: Hamstrings: approximately 55 degrees B.  Was 70 degrees in September 2023  POSTURE: rounded shoulders and forward head  PALPATION: No TTP in R shoulder or up traps. B up traps tight, low back TTP R and L proximal to Illiac crest  LUMBAR ROM:  AROM limited in all directions, but he lost his balance frequently, so accuracy of testing limited.   LOWER EXTREMITY ROM:      General mild stiffness throughout BLE with PROM.  LOWER EXTREMITY MMT:   MMT Right eval Left eval  Hip flexion 4- 4-  Hip extension    Hip abduction 4 4  Hip adduction    Hip internal rotation    Hip external rotation    Knee flexion 4+ 4-  Knee extension 4+ 4-  Ankle dorsiflexion 4 4  Ankle plantarflexion    Ankle inversion  Ankle eversion     (Blank rows = not tested)  LUMBAR SPECIAL TESTS:  Slump test: Positive R  Shoulder Special Tests empty can test (+) R, Hawkins-Kennedy test (+) R. Previous MRI reveals full tear of supraspinatus and partial tear of infraspinatus.  FUNCTIONAL TESTS:  5 times sit to stand: 30 seconds struggled with weak knees Timed up and go (TUG): 27 seconds with  SPC   TUG 10/12/22 = 13 seconds without device  Functional gait assessment: not tested BERG balance test:  23/56, 10/12/22 =29/56  GAIT: Distance walked: 80 Assistive device utilized: SPC, very unsteady used walls to stabilize, knees very shaky and unsteady, needing CGA to min A Level of assistance: CGA to min A for LOB Comments: Patient was unsteady with turns, relied on UE support for balance., legs shaky, presents like he has neuropathy but he does not, also reports double vision    TODAY'S TREATMENT  11/10/22 Nustep level 5 x 6 minutes Gait out the back door, curb, around the small parking island and then to the front door with gait belt and CGA Side stepping on the airex balance beam 2.5# march, hip abduction and extension Cone toe touches on and off airex with two SPC's Side stepping ball toss On airex head turns and volleyball in a corner On airex butt touches with back to wall  11/08/22 Nustep level 5 x 6 minutes Direction changes Walking figure 8's on the mat Walking across the mat without changing speed Direction changes In pbars 2.5# hip march, abduction and extension Side stepping on the airex balance beam On airex back to wall bottom touches and shoulder blade touches to the wall for balance and hip strategies On airex head turns Holding cane 12" toe touches and then cone toe touches volleyball  20# farmer carry Side stepping over 3 sticks  11/03/22 NuStep L5 x 6 minutes. Proprioceptive training on Air ex-Static standing, then ant/post and lateral weight shifts. Shaky throughout, but no true LOB. Seated ankle strength and stability exercises-inversion/ball squeeze, eversion, DF against G tband, and PF against Black Tband, 10 x 5 sec each exer, B. Standing rocker board in the parallel bars, very shaky, difficult to weight shift both ant/post and lat. Ambulation with head turns, walking backwards, high steps. Unsteady, occasional min A for  balance.   10/27/22 NuStep L5 x 6 minutes, U and LE, then attempted 30 sec at L8 for strength, legs only. Patient reported R knee pain, so tried to decrease resistance to L7, still had pain, so deferred. Assessed for ITB tightness, with pain noted mid thigh and proximally, with tightness. STM to R ITB F/B stretch to ITB and R hip as well. Sit to/from stand from mat with emphasis on slow decent x 10. Seated knee flex, BLE 35#, 2 x 10 Seated eccentric knee ext, 10#, 1 x 10 with each Upside down BOSU in parallel bars. Attempting static balance, 2 minutes. Max of ~10 sec. Wall bumps started at 4", progressed to approximately 12". Very stable and controlled in posterior weight shifts.  10/25/22 Nustep level 5 x 6 mintues Leg curls 35# 2x15 10# 2x10 leg extension 5# straight arm pulls on and off airex with CGA Sit to stand with elevated surface holding yellow weighted ball In pbars side stepping on airex balance beam In pbars marching, hip abduction and extension Walking direction changes Volleyball Tandem walk with min A  10/21/22 NuStep L5 x 6 minutes Practiced sit<> stand, controlling decent x 8 Leg press, 10 reps  with BLE, 30#, ULE 2 x 5 with 20#, emphasizing eccentric control, needed A to RLE. Seated knee flexion, 55#, BLE, 2 x 10 reps Pedalled bike at min 85 rpm, 3 x 30 sec with L2 resistance. Able to maintain > 90 rpm on last 2 rounds. Sit to and from stand on air ex pad x 8 reps, emphasizing controlled decent, mod unsteadiness, but no LOB noted.   PATIENT EDUCATION:  Education details: POC,  Person educated: Patient and Spouse Education method: Explanation Education comprehension: verbalized understanding   HOME EXERCISE PROGRAM: Access Code: [email or web address] URL: [email or web address] Date: 10/12/2022 Prepared by: Lum Babe  Exercises - Seated Isometric Cervical Sidebending  - 1 x daily - 7 x weekly - 2 sets - 10 reps - 3 hold - Seated Isometric Cervical  Rotation  - 1 x daily - 7 x weekly - 2 sets - 10 reps - 3 hold  [email or web address]   ASSESSMENT:  CLINICAL IMPRESSION: Patient continues to wait for the eye surgery hopefully next week, we walked outside and he did well but does require CGA as he tended to veer off with looking up in front of him and if he looked to the side he really had issues.  OBJECTIVE IMPAIRMENTS Abnormal gait, decreased activity tolerance, decreased balance, decreased cognition, decreased coordination, difficulty walking, decreased ROM, decreased strength, increased muscle spasms, impaired flexibility, postural dysfunction, and pain.   ACTIVITY LIMITATIONS carrying, lifting, bending, standing, squatting, stairs, reach over head, and locomotion level  PARTICIPATION LIMITATIONS: cleaning, laundry, driving, shopping, and community activity  PERSONAL FACTORS Age and 1 comorbidity: multiple meningiomas  are also affecting patient's functional outcome.   REHAB POTENTIAL: Good  CLINICAL DECISION MAKING: Evolving/moderate complexity  EVALUATION COMPLEXITY: Moderate   GOALS: Goals reviewed with patient? Yes  SHORT TERM GOALS: Target date: 10/04/22  I with initial HEP Baseline: Goal status: 10/21/22-met  LONG TERM GOALS: Target date: 12/20/22  I with final HEP Baseline:  Goal status: ongoing 11/10/22  2.  Patient will have no falls over a 6 week period Baseline: 2 falls  Goal status:met 11/08/22  3.  Complete 5 x STS in <12 sec to demonstrate improved LE strength Baseline: 30 seconds Goal status: ongoing  4.  Patient will complete TUG in < 12 sec with or without AD Baseline: 27 seconds Goal status: progressing  5.  Patient will score at least 43/56 on Berg balance Baseline: 23/56 Goal status: progressing  6.  Patient will walk at least 600' with LRAD, MI on level an unlevel surfaces, back pain < 3/10 Baseline: 80', unsteady, SPC with CGA/Min A Goal status: progressing   PLAN: PT FREQUENCY: 2x/week  PT  DURATION: 12 weeks  PLANNED INTERVENTIONS: Therapeutic exercises, Therapeutic activity, Neuromuscular re-education, Balance training, Gait training, Patient/Family education, Self Care, Joint mobilization, Stair training, Dry Needling, Electrical stimulation, Cryotherapy, Moist heat, Ultrasound, Ionotophoresis '4mg'$ /ml Dexamethasone, and Manual therapy.  PLAN FOR NEXT SESSION:   continue with the balance  Lum Babe, PT 11/10/22 1:10 PM

## 2022-11-15 ENCOUNTER — Ambulatory Visit: Payer: Medicare Other | Admitting: Physical Therapy

## 2022-11-15 DIAGNOSIS — H25811 Combined forms of age-related cataract, right eye: Secondary | ICD-10-CM | POA: Diagnosis not present

## 2022-11-15 DIAGNOSIS — H25812 Combined forms of age-related cataract, left eye: Secondary | ICD-10-CM | POA: Diagnosis not present

## 2022-11-17 ENCOUNTER — Encounter: Payer: Self-pay | Admitting: Physical Therapy

## 2022-11-17 ENCOUNTER — Ambulatory Visit: Payer: Medicare Other | Attending: Family Medicine | Admitting: Physical Therapy

## 2022-11-17 DIAGNOSIS — R296 Repeated falls: Secondary | ICD-10-CM | POA: Diagnosis not present

## 2022-11-17 DIAGNOSIS — R2681 Unsteadiness on feet: Secondary | ICD-10-CM | POA: Diagnosis not present

## 2022-11-17 DIAGNOSIS — D329 Benign neoplasm of meninges, unspecified: Secondary | ICD-10-CM

## 2022-11-17 DIAGNOSIS — R531 Weakness: Secondary | ICD-10-CM | POA: Insufficient documentation

## 2022-11-17 DIAGNOSIS — M6281 Muscle weakness (generalized): Secondary | ICD-10-CM | POA: Insufficient documentation

## 2022-11-17 DIAGNOSIS — R278 Other lack of coordination: Secondary | ICD-10-CM | POA: Diagnosis not present

## 2022-11-17 NOTE — Therapy (Signed)
OUTPATIENT PHYSICAL THERAPY THORACOLUMBAR TREATMENT   Patient Name: Timothy Moran MRN: [id number] DOB:[birth year], 77 y.o., male Today's Date: 11/17/2022   PT End of Session - 11/17/22 1319     Visit Number 15    Date for PT Re-Evaluation 12/20/22    PT Start Time [id number]    PT Stop Time [id number]    PT Time Calculation (min) 38 min    Activity Tolerance Patient tolerated treatment well    Behavior During Therapy [facility] for tasks assessed/performed               Past Medical History:  Diagnosis Date   Acute blood loss anemia 10/05/2020   Acute respiratory failure ([place]), hypothermia therapy, vent - extubated 10/06/20    AKI (acute kidney injury) ([place]) 10/05/2020   Anoxic brain injury ([place]) 10/26/2020   Arrhythmia    Ascending aortic aneurysm ([place]) 01/29/2018   a. 2019 4.3 cm by echo; b. 02/2021 Echo: Ao root 60m.   Benign brain tumor ([place]    Benign neoplasm of brain ([place] 12/19/2017   Cardiac arrest with ventricular fibrillation ([place]    Chest pain 12/04/2017   Chronic kidney disease, stage III (moderate) ([place] 12/19/2017   CKD (chronic kidney disease), stage III - IV (HCC)    Coronary Artery Disease    a. 10000000Inf STEMI complicated by cardiac arrest/PCI: LAD 50p/m, LCX 100d (2.5 x 30 Resolute Onyx DES); b. 04/2021 PCI: 04/2021 LM nl, LAD 50p/875m2.5x30 Onyx Frontier DES), LCX 25p, 5055mFR 0.98), 40d ISR (RFR 0.98), OM2 25, RCA mild diff dzs.   Diabetes mellitus, type 2 ([place]1/25/2022   10/06/20 A1C 7%   Dizziness 10/11/2019   Encounter for imaging study to confirm orogastric (OG) tube placement    Hematuria 10/26/2020   HFrEF (heart failure with reduced ejection fraction) ([place]  a. 11/2020 Echo: EF 25-30%; b. 02/2021 Echo: EF 25-30%, glob HK. Mild LVH. Nl RV fxn. Triv AI. Ao root 15m48m History of pulmonary embolus (PE)    HLD (hyperlipidemia) 10/26/2020   Hypertension    Iron deficiency anemia rec'd IV iron 10/26/2020   Ischemic cardiomyopathy    a. 11/2020 Echo: EF  25-30%; b. 02/2021 Echo: EF 25-30%, glob HK.   Left bundle branch block 12/19/2017   Meningioma (HCC)[place]/11/2019   Nonsustained ventricular tachycardia (HCC)[place]/29/2021   Pain of left hip joint 05/29/2020   Personal history of pulmonary embolism 12/19/2017   Pneumonia of both lungs due to infectious organism    Pulmonary hypertension due to thromboembolism (HCC)[place]/28/2019   See echo 12/27/17 with nl PAS vs CTa chest 02/23/18    S/P angioplasty with stent 10/04/20 DES to LCX  10/26/2020   Sinus bradycardia 12/22/2017   SOB (shortness of breath)    Solitary pulmonary nodule on lung CT 03/08/2018   CT 12/04/17 1.0 x 0.8 x 0.7 cm nodular opacity in the RUL vs not seen 03/16/10 posterior segment of the right upper lobe. .SpiMarland Kitchenometry 03/08/2018    FEV1 3.86 (108%)  Ratio 96 s prior rx  - PET  03/13/18   Low grade c/w adenoca > rec T surgery eval     STEMI (ST elevation myocardial infarction) (HCC)[place]/23/2022   Urinary retention 10/26/2020   Vestibular schwannoma (HCC)[place]/11/2019   Past Surgical History:  Procedure Laterality Date   APPENDECTOMY     BIV ICD INSERTION CRT-D N/A 11/03/2021   Procedure: BIV ICD INSERTION CRT-D;  Surgeon: CamnConstance Haw;  Location: [place] [facility];  Service: Cardiovascular;  Laterality: N/A;   CARDIAC CATHETERIZATION     CORONARY ANGIOPLASTY WITH STENT PLACEMENT Left 04/12/2021   LAD stent placement   CORONARY STENT INTERVENTION N/A 04/12/2021   Procedure: CORONARY STENT INTERVENTION;  Surgeon: Nelva Bush, MD;  Location: [place] [facility];  Service: Cardiovascular;  Laterality: N/A;   CORONARY/GRAFT ACUTE MI REVASCULARIZATION N/A 10/04/2020   Procedure: Coronary/Graft Acute MI Revascularization;  Surgeon: Nelva Bush, MD;  Location: [place] [facility];  Service: Cardiovascular;  Laterality: N/A;   HERNIA REPAIR     INTRAVASCULAR PRESSURE WIRE/FFR STUDY N/A 04/12/2021   Procedure: INTRAVASCULAR PRESSURE WIRE/FFR STUDY;  Surgeon: Nelva Bush, MD;   Location: [place] [facility];  Service: Cardiovascular;  Laterality: N/A;   INTRAVASCULAR ULTRASOUND/IVUS N/A 04/12/2021   Procedure: Intravascular Ultrasound/IVUS;  Surgeon: Nelva Bush, MD;  Location: [place] [facility];  Service: Cardiovascular;  Laterality: N/A;   LEFT HEART CATH AND CORONARY ANGIOGRAPHY N/A 10/04/2020   Procedure: LEFT HEART CATH AND CORONARY ANGIOGRAPHY;  Surgeon: Nelva Bush, MD;  Location: [place] [facility];  Service: Cardiovascular;  Laterality: N/A;   RIGHT/LEFT HEART CATH AND CORONARY ANGIOGRAPHY N/A 04/12/2021   Procedure: RIGHT/LEFT HEART CATH AND CORONARY ANGIOGRAPHY;  Surgeon: Nelva Bush, MD;  Location: [place] [facility];  Service: Cardiovascular;  Laterality: N/A;   Patient Active Problem List   Diagnosis Date Noted   Anemia    Leukocytosis    Right arm weakness 01/25/2022   Right sided weakness    Fall at home 01/22/2022   Status post biventricular pacemaker - MRI compatible PPM and leads 01/22/2022   Chronic indwelling Foley catheter 01/22/2022   CKD (chronic kidney disease), stage III - IV ([place]) 06/28/2021   Chronic HFrEF (heart failure with reduced ejection fraction) ([place]) 03/31/2021   Arrhythmia    DM (diabetes mellitus), type 2 ([place]) new 10/26/2020   Urinary retention 10/26/2020   Hyperlipidemia LDL goal <70 10/26/2020   Coronary artery disease of native artery of native heart with stable angina pectoris ([place]) 10/26/2020   S/P angioplasty with stent 10/04/20 DES to LCX  10/26/2020   Ischemic cardiomyopathy 10/26/2020   Diabetes mellitus, type 2 ([place]) 10/06/2020   DOE (dyspnea on exertion)    Meningioma ([place]) 08/14/2020   Vestibular schwannoma ([place]) 08/14/2020   Cardiac arrest with ventricular fibrillation (HCC)    Pain of left hip joint 05/29/2020   Benign brain tumor ([place])    Chronic kidney disease    History of pulmonary embolus (PE)    Hypertension    Nonsustained ventricular tachycardia ([place]) 10/11/2019   Dizziness  10/11/2019   Abnormal echocardiogram 06/07/2019   Pulmonary hypertension due to thromboembolism ([place]) 03/09/2018   Solitary pulmonary nodule on lung CT 03/08/2018   Ascending aortic aneurysm (HCC) 4.2 cm based on CT from December 2020 01/29/2018   Sinus bradycardia 12/22/2017   Left bundle branch block 12/19/2017   Personal history of pulmonary embolism - no longer on anticoagulation due to pt refusal to take it anymore. 12/19/2017   Chronic kidney disease, stage III (moderate) ([place]) 12/19/2017   Benign neoplasm of brain ([place]) 12/19/2017    PCP: Cari Caraway, MD   REFERRING PROVIDER: Cari Caraway, MD   REFERRING DIAG: Falls, unsteady gait, mm weakness Rationale for Evaluation and Treatment Rehabilitation  THERAPY DIAG:  Unsteadiness on feet  Right sided weakness  Repeated falls  Meningioma (HCC)  Muscle weakness (generalized)  Other lack of coordination  ONSET DATE:  05/10/2022   SUBJECTIVE:                                                                                                                                                                                           SUBJECTIVE STATEMENT: No stumbles or falls. His eye surgery is scheduled for early April.  PERTINENT HISTORY:  Brief HPI:   Elihue Lundsten is a 77 y.o. male who fell backward striking his head and right neck shoulder area on 01/21/2022. He complained of right hemiparesis. CT head revealed left parafalcine meningioma.Started on Decadron with improvement in symptoms   PAIN:  Are you having pain? no PRECAUTIONS: ICD/Pacemaker  WEIGHT BEARING RESTRICTIONS No  FALLS:  Has patient fallen in last 6 months? Yes. Number of falls 2  LIVING ENVIRONMENT: Lives with: lives with their spouse Lives in: House/apartment Stairs: Yes: External: 7 steps; bilateral but cannot reach both Has following equipment at home: Walker - 2 wheeled and a cane, shower seat  OCCUPATION: Retired  PLOF:  Independent  PATIENT GOALS Feel normal with standing and functioning.  Feel stronger and have better balance   OBJECTIVE:   DIAGNOSTIC FINDINGS:  CT Lumbar- 3. Moderate degenerative spinal stenosis at L2-3. CT CERVICAL IMPRESSION: 1. No evidence of intracranial injury. 2. Negative for cervical spine fracture but there is prevertebral edema at C4-5 suggesting soft tissue injury. 3. Meningiomas with left cerebral of edema, reference brain MRI 01/22/2022.  SCREENING FOR RED FLAGS: Bowel or bladder incontinence: No Spinal tumors: No Cauda equina syndrome: No Compression fracture: No Abdominal aneurysm: Yes: Abdominal aortic aneurysm, stable  COGNITION:  Overall cognitive status: WFL     SENSATION: WFL Patient reports tingling in B hands, all over.  MUSCLE LENGTH: Hamstrings: approximately 55 degrees B.  Was 70 degrees in September 2023  POSTURE: rounded shoulders and forward head  PALPATION: No TTP in R shoulder or up traps. B up traps tight, low back TTP R and L proximal to Illiac crest  LUMBAR ROM:  AROM limited in all directions, but he lost his balance frequently, so accuracy of testing limited.   LOWER EXTREMITY ROM:      General mild stiffness throughout BLE with PROM.  LOWER EXTREMITY MMT:   MMT Right eval Left eval  Hip flexion 4- 4-  Hip extension    Hip abduction 4 4  Hip adduction    Hip internal rotation    Hip external rotation    Knee flexion 4+ 4-  Knee extension 4+ 4-  Ankle dorsiflexion 4 4  Ankle plantarflexion    Ankle inversion    Ankle eversion     (Blank  rows = not tested)  LUMBAR SPECIAL TESTS:  Slump test: Positive R  Shoulder Special Tests empty can test (+) R, Hawkins-Kennedy test (+) R. Previous MRI reveals full tear of supraspinatus and partial tear of infraspinatus.  FUNCTIONAL TESTS:  5 times sit to stand: 30 seconds struggled with weak knees Timed up and go (TUG): 27 seconds with SPC   TUG 10/12/22 = 13 seconds without  device  Functional gait assessment: not tested BERG balance test:  23/56, 10/12/22 =29/56  GAIT: Distance walked: 80 Assistive device utilized: SPC, very unsteady used walls to stabilize, knees very shaky and unsteady, needing CGA to min A Level of assistance: CGA to min A for LOB Comments: Patient was unsteady with turns, relied on UE support for balance., legs shaky, presents like he has neuropathy but he does not, also reports double vision    TODAY'S TREATMENT  11/17/22 NuStep L5 x 6 minutes Standing in parallel bars- crossover step with weight shift and back, R/L, cross behind and back, and SLS. He required UE support for each exercise. Did show minimal improvement in stability with repetition of each activity. Step ups- with 2# weights on each ankle- 10 forward on 6" step, 10 side step on 4" step, all with U or BUE support. Too unsteady to try crossover step. B side stepping against G tband resistance, 6 x 4 steps each way. Moderately unsteady with multiple episodes of LOB. Seated knee flexion BLE, 25#, 2 x 15 reps Seated knee ext against 10#, 2 x 15 reps Leg press, 20#, 2 x 10 reps.  \11/10/22 Nustep level 5 x 6 minutes Gait out the back door, curb, around the small parking island and then to the front door with gait belt and CGA Side stepping on the airex balance beam 2.5# march, hip abduction and extension Cone toe touches on and off airex with two SPC's Side stepping ball toss On airex head turns and volleyball in a corner On airex butt touches with back to wall  11/08/22 Nustep level 5 x 6 minutes Direction changes Walking figure 8's on the mat Walking across the mat without changing speed Direction changes In pbars 2.5# hip march, abduction and extension Side stepping on the airex balance beam On airex back to wall bottom touches and shoulder blade touches to the wall for balance and hip strategies On airex head turns Holding cane 12" toe touches and then cone toe  touches volleyball  20# farmer carry Side stepping over 3 sticks  11/03/22 NuStep L5 x 6 minutes. Proprioceptive training on Air ex-Static standing, then ant/post and lateral weight shifts. Shaky throughout, but no true LOB. Seated ankle strength and stability exercises-inversion/ball squeeze, eversion, DF against G tband, and PF against Black Tband, 10 x 5 sec each exer, B. Standing rocker board in the parallel bars, very shaky, difficult to weight shift both ant/post and lat. Ambulation with head turns, walking backwards, high steps. Unsteady, occasional min A for balance.   10/27/22 NuStep L5 x 6 minutes, U and LE, then attempted 30 sec at L8 for strength, legs only. Patient reported R knee pain, so tried to decrease resistance to L7, still had pain, so deferred. Assessed for ITB tightness, with pain noted mid thigh and proximally, with tightness. STM to R ITB F/B stretch to ITB and R hip as well. Sit to/from stand from mat with emphasis on slow decent x 10. Seated knee flex, BLE 35#, 2 x 10 Seated eccentric knee ext, 10#, 1 x 10  with each Upside down BOSU in parallel bars. Attempting static balance, 2 minutes. Max of ~10 sec. Wall bumps started at 4", progressed to approximately 12". Very stable and controlled in posterior weight shifts.  10/25/22 Nustep level 5 x 6 mintues Leg curls 35# 2x15 10# 2x10 leg extension 5# straight arm pulls on and off airex with CGA Sit to stand with elevated surface holding yellow weighted ball In pbars side stepping on airex balance beam In pbars marching, hip abduction and extension Walking direction changes Volleyball Tandem walk with min A  10/21/22 NuStep L5 x 6 minutes Practiced sit<> stand, controlling decent x 8 Leg press, 10 reps with BLE, 30#, ULE 2 x 5 with 20#, emphasizing eccentric control, needed A to RLE. Seated knee flexion, 55#, BLE, 2 x 10 reps Pedalled bike at min 85 rpm, 3 x 30 sec with L2 resistance. Able to maintain > 90 rpm  on last 2 rounds. Sit to and from stand on air ex pad x 8 reps, emphasizing controlled decent, mod unsteadiness, but no LOB noted.   PATIENT EDUCATION:  Education details: POC,  Person educated: Patient and Spouse Education method: Explanation Education comprehension: verbalized understanding   HOME EXERCISE PROGRAM: Access Code: [email or web address] URL: [email or web address] Date: 10/12/2022 Prepared by: Lum Babe  Exercises - Seated Isometric Cervical Sidebending  - 1 x daily - 7 x weekly - 2 sets - 10 reps - 3 hold - Seated Isometric Cervical Rotation  - 1 x daily - 7 x weekly - 2 sets - 10 reps - 3 hold  [email or web address]   ASSESSMENT:  CLINICAL IMPRESSION: Patient appears mush more unsteady today with multiple episodes of LOB in standing activities. He reports no change in his dizziness, does not note any changes in balance at home. Will monitor and see how he does on his next visit. Ey surgery in early April. He will have 1 eye operation, then the other a week later.  OBJECTIVE IMPAIRMENTS Abnormal gait, decreased activity tolerance, decreased balance, decreased cognition, decreased coordination, difficulty walking, decreased ROM, decreased strength, increased muscle spasms, impaired flexibility, postural dysfunction, and pain.   ACTIVITY LIMITATIONS carrying, lifting, bending, standing, squatting, stairs, reach over head, and locomotion level  PARTICIPATION LIMITATIONS: cleaning, laundry, driving, shopping, and community activity  PERSONAL FACTORS Age and 1 comorbidity: multiple meningiomas  are also affecting patient's functional outcome.   REHAB POTENTIAL: Good  CLINICAL DECISION MAKING: Evolving/moderate complexity  EVALUATION COMPLEXITY: Moderate   GOALS: Goals reviewed with patient? Yes  SHORT TERM GOALS: Target date: 10/04/22  I with initial HEP Baseline: Goal status: 10/21/22-met  LONG TERM GOALS: Target date: 12/20/22  I with final HEP Baseline:   Goal status: ongoing 11/10/22  2.  Patient will have no falls over a 6 week period Baseline: 2 falls  Goal status:met 11/08/22  3.  Complete 5 x STS in <12 sec to demonstrate improved LE strength Baseline: 30 seconds Goal status: ongoing  4.  Patient will complete TUG in < 12 sec with or without AD Baseline: 27 seconds Goal status: progressing  5.  Patient will score at least 43/56 on Berg balance Baseline: 23/56 Goal status: progressing  6.  Patient will walk at least 600' with LRAD, MI on level an unlevel surfaces, back pain < 3/10 Baseline: 80', unsteady, SPC with CGA/Min A Goal status: progressing   PLAN: PT FREQUENCY: 2x/week  PT DURATION: 12 weeks  PLANNED INTERVENTIONS: Therapeutic exercises, Therapeutic activity, Neuromuscular re-education, Balance training, Gait training, Patient/Family education, Self Care,  Joint mobilization, Stair training, Dry Needling, Electrical stimulation, Cryotherapy, Moist heat, Ultrasound, Ionotophoresis '4mg'$ /ml Dexamethasone, and Manual therapy.  PLAN FOR NEXT SESSION:   continue with the balance  Ethel Rana DPT 11/17/22 1:54 PM

## 2022-11-22 ENCOUNTER — Encounter: Payer: Self-pay | Admitting: Physical Therapy

## 2022-11-22 ENCOUNTER — Ambulatory Visit: Payer: Medicare Other | Admitting: Physical Therapy

## 2022-11-22 DIAGNOSIS — R296 Repeated falls: Secondary | ICD-10-CM

## 2022-11-22 DIAGNOSIS — M6281 Muscle weakness (generalized): Secondary | ICD-10-CM | POA: Diagnosis not present

## 2022-11-22 DIAGNOSIS — R2681 Unsteadiness on feet: Secondary | ICD-10-CM

## 2022-11-22 DIAGNOSIS — R278 Other lack of coordination: Secondary | ICD-10-CM

## 2022-11-22 DIAGNOSIS — D329 Benign neoplasm of meninges, unspecified: Secondary | ICD-10-CM | POA: Diagnosis not present

## 2022-11-22 DIAGNOSIS — R531 Weakness: Secondary | ICD-10-CM

## 2022-11-22 NOTE — Therapy (Signed)
OUTPATIENT PHYSICAL THERAPY THORACOLUMBAR TREATMENT   Patient Name: Timothy Moran MRN: [id number] DOB:April 27, 1946, 77 y.o., male Today's Date: 11/22/2022   PT End of Session - 11/22/22 1057     Visit Number 16    Date for PT Re-Evaluation 12/20/22    PT Start Time 1055    PT Stop Time 1135    PT Time Calculation (min) 40 min    Equipment Utilized During Treatment Gait belt    Activity Tolerance Patient tolerated treatment well    Behavior During Therapy [facility] for tasks assessed/performed               Past Medical History:  Diagnosis Date   Acute blood loss anemia 10/05/2020   Acute respiratory failure ([place]), hypothermia therapy, vent - extubated 10/06/20    AKI (acute kidney injury) ([place]) 10/05/2020   Anoxic brain injury ([place]) 10/26/2020   Arrhythmia    Ascending aortic aneurysm ([place]) 01/29/2018   a. 2019 4.3 cm by echo; b. 02/2021 Echo: Ao root 66m.   Benign brain tumor ([place]    Benign neoplasm of brain ([place] 12/19/2017   Cardiac arrest with ventricular fibrillation ([place]    Chest pain 12/04/2017   Chronic kidney disease, stage III (moderate) ([place] 12/19/2017   CKD (chronic kidney disease), stage III - IV (HCC)    Coronary Artery Disease    a. 10000000Inf STEMI complicated by cardiac arrest/PCI: LAD 50p/m, LCX 100d (2.5 x 30 Resolute Onyx DES); b. 04/2021 PCI: 04/2021 LM nl, LAD 50p/844m2.5x30 Onyx Frontier DES), LCX 25p, 5054mFR 0.98), 40d ISR (RFR 0.98), OM2 25, RCA mild diff dzs.   Diabetes mellitus, type 2 ([place]1/25/2022   10/06/20 A1C 7%   Dizziness 10/11/2019   Encounter for imaging study to confirm orogastric (OG) tube placement    Hematuria 10/26/2020   HFrEF (heart failure with reduced ejection fraction) ([place]  a. 11/2020 Echo: EF 25-30%; b. 02/2021 Echo: EF 25-30%, glob HK. Mild LVH. Nl RV fxn. Triv AI. Ao root 3m38m History of pulmonary embolus (PE)    HLD (hyperlipidemia) 10/26/2020   Hypertension    Iron deficiency anemia rec'd IV iron 10/26/2020    Ischemic cardiomyopathy    a. 11/2020 Echo: EF 25-30%; b. 02/2021 Echo: EF 25-30%, glob HK.   Left bundle branch block 12/19/2017   Meningioma (HCC)[place]/11/2019   Nonsustained ventricular tachycardia (HCC)[place]/29/2021   Pain of left hip joint 05/29/2020   Personal history of pulmonary embolism 12/19/2017   Pneumonia of both lungs due to infectious organism    Pulmonary hypertension due to thromboembolism (HCC)[place]/28/2019   See echo 12/27/17 with nl PAS vs CTa chest 02/23/18    S/P angioplasty with stent 10/04/20 DES to LCX  10/26/2020   Sinus bradycardia 12/22/2017   SOB (shortness of breath)    Solitary pulmonary nodule on lung CT 03/08/2018   CT 12/04/17 1.0 x 0.8 x 0.7 cm nodular opacity in the RUL vs not seen 03/16/10 posterior segment of the right upper lobe. .SpiMarland Kitchenometry 03/08/2018    FEV1 3.86 (108%)  Ratio 96 s prior rx  - PET  03/13/18   Low grade c/w adenoca > rec T surgery eval     STEMI (ST elevation myocardial infarction) (HCC)[place]/23/2022   Urinary retention 10/26/2020   Vestibular schwannoma (HCC)[place]/11/2019   Past Surgical History:  Procedure Laterality Date   APPENDECTOMY     BIV ICD INSERTION CRT-D N/A 11/03/2021   Procedure: BIV ICD  INSERTION CRT-D;  Surgeon: Constance Haw, MD;  Location: [facility] [facility];  Service: Cardiovascular;  Laterality: N/A;   CARDIAC CATHETERIZATION     CORONARY ANGIOPLASTY WITH STENT PLACEMENT Left 04/12/2021   LAD stent placement   CORONARY STENT INTERVENTION N/A 04/12/2021   Procedure: CORONARY STENT INTERVENTION;  Surgeon: Nelva Bush, MD;  Location: [place] [facility];  Service: Cardiovascular;  Laterality: N/A;   CORONARY/GRAFT ACUTE MI REVASCULARIZATION N/A 10/04/2020   Procedure: Coronary/Graft Acute MI Revascularization;  Surgeon: Nelva Bush, MD;  Location: [place] [facility];  Service: Cardiovascular;  Laterality: N/A;   HERNIA REPAIR     INTRAVASCULAR PRESSURE WIRE/FFR STUDY N/A 04/12/2021   Procedure: INTRAVASCULAR PRESSURE  WIRE/FFR STUDY;  Surgeon: Nelva Bush, MD;  Location: [place] [facility];  Service: Cardiovascular;  Laterality: N/A;   INTRAVASCULAR ULTRASOUND/IVUS N/A 04/12/2021   Procedure: Intravascular Ultrasound/IVUS;  Surgeon: Nelva Bush, MD;  Location: [place] [facility];  Service: Cardiovascular;  Laterality: N/A;   LEFT HEART CATH AND CORONARY ANGIOGRAPHY N/A 10/04/2020   Procedure: LEFT HEART CATH AND CORONARY ANGIOGRAPHY;  Surgeon: Nelva Bush, MD;  Location: [place] [facility];  Service: Cardiovascular;  Laterality: N/A;   RIGHT/LEFT HEART CATH AND CORONARY ANGIOGRAPHY N/A 04/12/2021   Procedure: RIGHT/LEFT HEART CATH AND CORONARY ANGIOGRAPHY;  Surgeon: Nelva Bush, MD;  Location: [place] [facility];  Service: Cardiovascular;  Laterality: N/A;   Patient Active Problem List   Diagnosis Date Noted   Anemia    Leukocytosis    Right arm weakness 01/25/2022   Right sided weakness    Fall at home 01/22/2022   Status post biventricular pacemaker - MRI compatible PPM and leads 01/22/2022   Chronic indwelling Foley catheter 01/22/2022   CKD (chronic kidney disease), stage III - IV ([place]) 06/28/2021   Chronic HFrEF (heart failure with reduced ejection fraction) ([place]) 03/31/2021   Arrhythmia    DM (diabetes mellitus), type 2 ([place]) new 10/26/2020   Urinary retention 10/26/2020   Hyperlipidemia LDL goal <70 10/26/2020   Coronary artery disease of native artery of native heart with stable angina pectoris ([place]) 10/26/2020   S/P angioplasty with stent 10/04/20 DES to LCX  10/26/2020   Ischemic cardiomyopathy 10/26/2020   Diabetes mellitus, type 2 ([place]) 10/06/2020   DOE (dyspnea on exertion)    Meningioma ([place]) 08/14/2020   Vestibular schwannoma ([place]) 08/14/2020   Cardiac arrest with ventricular fibrillation (HCC)    Pain of left hip joint 05/29/2020   Benign brain tumor ([place])    Chronic kidney disease    History of pulmonary embolus (PE)    Hypertension    Nonsustained ventricular  tachycardia ([place]) 10/11/2019   Dizziness 10/11/2019   Abnormal echocardiogram 06/07/2019   Pulmonary hypertension due to thromboembolism ([place]) 03/09/2018   Solitary pulmonary nodule on lung CT 03/08/2018   Ascending aortic aneurysm (HCC) 4.2 cm based on CT from December 2020 01/29/2018   Sinus bradycardia 12/22/2017   Left bundle branch block 12/19/2017   Personal history of pulmonary embolism - no longer on anticoagulation due to pt refusal to take it anymore. 12/19/2017   Chronic kidney disease, stage III (moderate) ([place]) 12/19/2017   Benign neoplasm of brain ([place]) 12/19/2017    PCP: Cari Caraway, MD   REFERRING PROVIDER: Cari Caraway, MD   REFERRING DIAG: Falls, unsteady gait, mm weakness Rationale for Evaluation and Treatment Rehabilitation  THERAPY DIAG:  Unsteadiness on feet  Right sided weakness  Meningioma (HCC)  Repeated falls  Muscle weakness (  generalized)  Other lack of coordination  ONSET DATE: 05/10/2022   SUBJECTIVE:                                                                                                                                                                                           SUBJECTIVE STATEMENT: Patient reports new L knee pain. He does not know of a specific incident, but has been walking and performing yard work. He sat down and when he tried to get up it hurt. The pain is on the outside of the knee inside the joint. It was better today, but still slightly irritated.  PERTINENT HISTORY:  Brief HPI:   Yukio Lewark is a 77 y.o. male who fell backward striking his head and right neck shoulder area on 01/21/2022. He complained of right hemiparesis. CT head revealed left parafalcine meningioma.Started on Decadron with improvement in symptoms   PAIN:  Are you having pain? no PRECAUTIONS: ICD/Pacemaker  WEIGHT BEARING RESTRICTIONS No  FALLS:  Has patient fallen in last 6 months? Yes. Number of falls 2  LIVING  ENVIRONMENT: Lives with: lives with their spouse Lives in: House/apartment Stairs: Yes: External: 7 steps; bilateral but cannot reach both Has following equipment at home: Walker - 2 wheeled and a cane, shower seat  OCCUPATION: Retired  PLOF: Independent  PATIENT GOALS Feel normal with standing and functioning.  Feel stronger and have better balance   OBJECTIVE:   DIAGNOSTIC FINDINGS:  CT Lumbar- 3. Moderate degenerative spinal stenosis at L2-3. CT CERVICAL IMPRESSION: 1. No evidence of intracranial injury. 2. Negative for cervical spine fracture but there is prevertebral edema at C4-5 suggesting soft tissue injury. 3. Meningiomas with left cerebral of edema, reference brain MRI 01/22/2022.  SCREENING FOR RED FLAGS: Bowel or bladder incontinence: No Spinal tumors: No Cauda equina syndrome: No Compression fracture: No Abdominal aneurysm: Yes: Abdominal aortic aneurysm, stable  COGNITION:  Overall cognitive status: WFL     SENSATION: WFL Patient reports tingling in B hands, all over.  MUSCLE LENGTH: Hamstrings: approximately 55 degrees B.  Was 70 degrees in September 2023  POSTURE: rounded shoulders and forward head  PALPATION: No TTP in R shoulder or up traps. B up traps tight, low back TTP R and L proximal to Illiac crest  LUMBAR ROM:  AROM limited in all directions, but he lost his balance frequently, so accuracy of testing limited.   LOWER EXTREMITY ROM:      General mild stiffness throughout BLE with PROM.  LOWER EXTREMITY MMT:   MMT Right eval Left eval  Hip flexion 4- 4-  Hip extension    Hip abduction 4  4  Hip adduction    Hip internal rotation    Hip external rotation    Knee flexion 4+ 4-  Knee extension 4+ 4-  Ankle dorsiflexion 4 4  Ankle plantarflexion    Ankle inversion    Ankle eversion     (Blank rows = not tested)  LUMBAR SPECIAL TESTS:  Slump test: Positive R  Shoulder Special Tests empty can test (+) R, Hawkins-Kennedy test  (+) R. Previous MRI reveals full tear of supraspinatus and partial tear of infraspinatus.  FUNCTIONAL TESTS:  5 times sit to stand: 30 seconds struggled with weak knees Timed up and go (TUG): 27 seconds with SPC   TUG 10/12/22 = 13 seconds without device  Functional gait assessment: not tested BERG balance test:  23/56, 10/12/22 =29/56  GAIT: Distance walked: 80 Assistive device utilized: SPC, very unsteady used walls to stabilize, knees very shaky and unsteady, needing CGA to min A Level of assistance: CGA to min A for LOB Comments: Patient was unsteady with turns, relied on UE support for balance., legs shaky, presents like he has neuropathy but he does not, also reports double vision  TODAY'S TREATMENT  11/22/22 NuStep L4 x 6 minutes. No problems/pain in knee, using BUE and LE. Seated B knee flexion 35#, 3 x 10 reps Seated knee ext 10#, 3 x 10 reps-minimal L knee pain at initiation. L knee assessment- MCL/LCL intact, McMurray's test (-) TTP lateral femoral epicondyle. Standing ITB stretch, caused L hip pain from his previous fall 6 months ago Standing hip abd against G tband at ankles, 2 x 10 each, Mod TC and VC for form. Alternating step taps without UE support, CGA, unsteady, but no LOB Multiple taps with each foot required occasional min a due to LOB Lateral step taps, CGA.   11/17/22 NuStep L5 x 6 minutes Standing in parallel bars- crossover step with weight shift and back, R/L, cross behind and back, and SLS. He required UE support for each exercise. Did show minimal improvement in stability with repetition of each activity. Step ups- with 2# weights on each ankle- 10 forward on 6" step, 10 side step on 4" step, all with U or BUE support. Too unsteady to try crossover step. B side stepping against G tband resistance, 6 x 4 steps each way. Moderately unsteady with multiple episodes of LOB. Seated knee flexion BLE, 25#, 2 x 15 reps Seated knee ext against 10#, 2 x 15 reps Leg  press, 20#, 2 x 10 reps.  \11/10/22 Nustep level 5 x 6 minutes Gait out the back door, curb, around the small parking island and then to the front door with gait belt and CGA Side stepping on the airex balance beam 2.5# march, hip abduction and extension Cone toe touches on and off airex with two SPC's Side stepping ball toss On airex head turns and volleyball in a corner On airex butt touches with back to wall  11/08/22 Nustep level 5 x 6 minutes Direction changes Walking figure 8's on the mat Walking across the mat without changing speed Direction changes In pbars 2.5# hip march, abduction and extension Side stepping on the airex balance beam On airex back to wall bottom touches and shoulder blade touches to the wall for balance and hip strategies On airex head turns Holding cane 12" toe touches and then cone toe touches volleyball  20# farmer carry Side stepping over 3 sticks  11/03/22 NuStep L5 x 6 minutes. Proprioceptive training on Air ex-Static standing, then  ant/post and lateral weight shifts. Shaky throughout, but no true LOB. Seated ankle strength and stability exercises-inversion/ball squeeze, eversion, DF against G tband, and PF against Black Tband, 10 x 5 sec each exer, B. Standing rocker board in the parallel bars, very shaky, difficult to weight shift both ant/post and lat. Ambulation with head turns, walking backwards, high steps. Unsteady, occasional min A for balance.   10/27/22 NuStep L5 x 6 minutes, U and LE, then attempted 30 sec at L8 for strength, legs only. Patient reported R knee pain, so tried to decrease resistance to L7, still had pain, so deferred. Assessed for ITB tightness, with pain noted mid thigh and proximally, with tightness. STM to R ITB F/B stretch to ITB and R hip as well. Sit to/from stand from mat with emphasis on slow decent x 10. Seated knee flex, BLE 35#, 2 x 10 Seated eccentric knee ext, 10#, 1 x 10 with each Upside down BOSU in  parallel bars. Attempting static balance, 2 minutes. Max of ~10 sec. Wall bumps started at 4", progressed to approximately 12". Very stable and controlled in posterior weight shifts.   PATIENT EDUCATION:  Education details: POC,  Person educated: Patient and Spouse Education method: Explanation Education comprehension: verbalized understanding   HOME EXERCISE PROGRAM: Access Code: [email or web address] URL: [email or web address] Date: 10/12/2022 Prepared by: Lum Babe  Exercises - Seated Isometric Cervical Sidebending  - 1 x daily - 7 x weekly - 2 sets - 10 reps - 3 hold - Seated Isometric Cervical Rotation  - 1 x daily - 7 x weekly - 2 sets - 10 reps - 3 hold  [email or web address]   ASSESSMENT:  CLINICAL IMPRESSION: Patient reports episode of L lateral knee pain. He does not know of a specific incident, but it did occur after performing yardwork. Assessment was largely (-), he does have some TTP on lateral knee and reported pain today when rising from mod surfaces. Could be ITB as he has some tightness nad the pain pattern fits. Educated in stretching. Treatment also continued to address strength and balance, multiple episodes of unsteadiness upon first rising.  OBJECTIVE IMPAIRMENTS Abnormal gait, decreased activity tolerance, decreased balance, decreased cognition, decreased coordination, difficulty walking, decreased ROM, decreased strength, increased muscle spasms, impaired flexibility, postural dysfunction, and pain.   ACTIVITY LIMITATIONS carrying, lifting, bending, standing, squatting, stairs, reach over head, and locomotion level  PARTICIPATION LIMITATIONS: cleaning, laundry, driving, shopping, and community activity  PERSONAL FACTORS Age and 1 comorbidity: multiple meningiomas  are also affecting patient's functional outcome.   REHAB POTENTIAL: Good  CLINICAL DECISION MAKING: Evolving/moderate complexity  EVALUATION COMPLEXITY: Moderate   GOALS: Goals reviewed with  patient? Yes  SHORT TERM GOALS: Target date: 10/04/22  I with initial HEP Baseline: Goal status: 10/21/22-met  LONG TERM GOALS: Target date: 12/20/22  I with final HEP Baseline:  Goal status: ongoing 11/10/22  2.  Patient will have no falls over a 6 week period Baseline: 2 falls  Goal status:met 11/08/22  3.  Complete 5 x STS in <12 sec to demonstrate improved LE strength Baseline: 30 seconds Goal status: ongoing  4.  Patient will complete TUG in < 12 sec with or without AD Baseline: 27 seconds Goal status: progressing  5.  Patient will score at least 43/56 on Berg balance Baseline: 23/56 Goal status: progressing  6.  Patient will walk at least 600' with LRAD, MI on level an unlevel surfaces, back pain < 3/10 Baseline: 80', unsteady, SPC with CGA/Min A Goal status: progressing  PLAN: PT FREQUENCY: 2x/week  PT DURATION: 12 weeks  PLANNED INTERVENTIONS: Therapeutic exercises, Therapeutic activity, Neuromuscular re-education, Balance training, Gait training, Patient/Family education, Self Care, Joint mobilization, Stair training, Dry Needling, Electrical stimulation, Cryotherapy, Moist heat, Ultrasound, Ionotophoresis '4mg'$ /ml Dexamethasone, and Manual therapy.  PLAN FOR NEXT SESSION:   continue with the balance  Ethel Rana DPT 11/22/22 11:52 AM

## 2022-11-24 ENCOUNTER — Ambulatory Visit: Payer: Medicare Other | Admitting: Physical Therapy

## 2022-11-24 ENCOUNTER — Encounter: Payer: Self-pay | Admitting: Physical Therapy

## 2022-11-24 DIAGNOSIS — R278 Other lack of coordination: Secondary | ICD-10-CM | POA: Diagnosis not present

## 2022-11-24 DIAGNOSIS — M6281 Muscle weakness (generalized): Secondary | ICD-10-CM | POA: Diagnosis not present

## 2022-11-24 DIAGNOSIS — R531 Weakness: Secondary | ICD-10-CM

## 2022-11-24 DIAGNOSIS — R2681 Unsteadiness on feet: Secondary | ICD-10-CM

## 2022-11-24 DIAGNOSIS — D329 Benign neoplasm of meninges, unspecified: Secondary | ICD-10-CM

## 2022-11-24 DIAGNOSIS — R296 Repeated falls: Secondary | ICD-10-CM | POA: Diagnosis not present

## 2022-11-24 NOTE — Therapy (Signed)
OUTPATIENT PHYSICAL THERAPY THORACOLUMBAR TREATMENT   Patient Name: Timothy Moran MRN: [id number] DOB:Nov 18, 1945, 77 y.o., male Today's Date: 11/24/2022   PT End of Session - 11/24/22 1314     Visit Number 17    Date for PT Re-Evaluation 12/20/22    PT Start Time 1312    PT Stop Time 1357    PT Time Calculation (min) 45 min    Equipment Utilized During Treatment Gait belt    Activity Tolerance Patient tolerated treatment well    Behavior During Therapy [facility] for tasks assessed/performed                Past Medical History:  Diagnosis Date   Acute blood loss anemia 10/05/2020   Acute respiratory failure ([place]), hypothermia therapy, vent - extubated 10/06/20    AKI (acute kidney injury) ([place]) 10/05/2020   Anoxic brain injury ([place]) 10/26/2020   Arrhythmia    Ascending aortic aneurysm ([place]) 01/29/2018   a. 2019 4.3 cm by echo; b. 02/2021 Echo: Ao root 61m.   Benign brain tumor ([place]    Benign neoplasm of brain ([place] 12/19/2017   Cardiac arrest with ventricular fibrillation ([place]    Chest pain 12/04/2017   Chronic kidney disease, stage III (moderate) ([place] 12/19/2017   CKD (chronic kidney disease), stage III - IV (HCC)    Coronary Artery Disease    a. 10000000Inf STEMI complicated by cardiac arrest/PCI: LAD 50p/m, LCX 100d (2.5 x 30 Resolute Onyx DES); b. 04/2021 PCI: 04/2021 LM nl, LAD 50p/841m2.5x30 Onyx Frontier DES), LCX 25p, 5085mFR 0.98), 40d ISR (RFR 0.98), OM2 25, RCA mild diff dzs.   Diabetes mellitus, type 2 ([place]1/25/2022   10/06/20 A1C 7%   Dizziness 10/11/2019   Encounter for imaging study to confirm orogastric (OG) tube placement    Hematuria 10/26/2020   HFrEF (heart failure with reduced ejection fraction) ([place]  a. 11/2020 Echo: EF 25-30%; b. 02/2021 Echo: EF 25-30%, glob HK. Mild LVH. Nl RV fxn. Triv AI. Ao root 10m54m History of pulmonary embolus (PE)    HLD (hyperlipidemia) 10/26/2020   Hypertension    Iron deficiency anemia rec'd IV iron  10/26/2020   Ischemic cardiomyopathy    a. 11/2020 Echo: EF 25-30%; b. 02/2021 Echo: EF 25-30%, glob HK.   Left bundle branch block 12/19/2017   Meningioma (HCC)[place]/11/2019   Nonsustained ventricular tachycardia (HCC)[place]/29/2021   Pain of left hip joint 05/29/2020   Personal history of pulmonary embolism 12/19/2017   Pneumonia of both lungs due to infectious organism    Pulmonary hypertension due to thromboembolism (HCC)[place]/28/2019   See echo 12/27/17 with nl PAS vs CTa chest 02/23/18    S/P angioplasty with stent 10/04/20 DES to LCX  10/26/2020   Sinus bradycardia 12/22/2017   SOB (shortness of breath)    Solitary pulmonary nodule on lung CT 03/08/2018   CT 12/04/17 1.0 x 0.8 x 0.7 cm nodular opacity in the RUL vs not seen 03/16/10 posterior segment of the right upper lobe. .Spi[place] Kitchenometry 03/08/2018    FEV1 3.86 (108%)  Ratio 96 s prior rx  - PET  03/13/18   Low grade c/w adenoca > rec T surgery eval     STEMI (ST elevation myocardial infarction) (HCC)[place]/23/2022   Urinary retention 10/26/2020   Vestibular schwannoma (HCC)[place]/11/2019   Past Surgical History:  Procedure Laterality Date   APPENDECTOMY     BIV ICD INSERTION CRT-D N/A 11/03/2021   Procedure: BIV  ICD INSERTION CRT-D;  Surgeon: Constance Haw, MD;  Location: [place] [facility];  Service: Cardiovascular;  Laterality: N/A;   CARDIAC CATHETERIZATION     CORONARY ANGIOPLASTY WITH STENT PLACEMENT Left 04/12/2021   LAD stent placement   CORONARY STENT INTERVENTION N/A 04/12/2021   Procedure: CORONARY STENT INTERVENTION;  Surgeon: Nelva Bush, MD;  Location: [place] [facility];  Service: Cardiovascular;  Laterality: N/A;   CORONARY/GRAFT ACUTE MI REVASCULARIZATION N/A 10/04/2020   Procedure: Coronary/Graft Acute MI Revascularization;  Surgeon: Nelva Bush, MD;  Location: [place] [facility];  Service: Cardiovascular;  Laterality: N/A;   HERNIA REPAIR     INTRAVASCULAR PRESSURE WIRE/FFR STUDY N/A 04/12/2021   Procedure:  INTRAVASCULAR PRESSURE WIRE/FFR STUDY;  Surgeon: Nelva Bush, MD;  Location: [place] [facility];  Service: Cardiovascular;  Laterality: N/A;   INTRAVASCULAR ULTRASOUND/IVUS N/A 04/12/2021   Procedure: Intravascular Ultrasound/IVUS;  Surgeon: Nelva Bush, MD;  Location: [place] [facility];  Service: Cardiovascular;  Laterality: N/A;   LEFT HEART CATH AND CORONARY ANGIOGRAPHY N/A 10/04/2020   Procedure: LEFT HEART CATH AND CORONARY ANGIOGRAPHY;  Surgeon: Nelva Bush, MD;  Location: [place] [facility];  Service: Cardiovascular;  Laterality: N/A;   RIGHT/LEFT HEART CATH AND CORONARY ANGIOGRAPHY N/A 04/12/2021   Procedure: RIGHT/LEFT HEART CATH AND CORONARY ANGIOGRAPHY;  Surgeon: Nelva Bush, MD;  Location: [place] [facility];  Service: Cardiovascular;  Laterality: N/A;   Patient Active Problem List   Diagnosis Date Noted   Anemia    Leukocytosis    Right arm weakness 01/25/2022   Right sided weakness    Fall at home 01/22/2022   Status post biventricular pacemaker - MRI compatible PPM and leads 01/22/2022   Chronic indwelling Foley catheter 01/22/2022   CKD (chronic kidney disease), stage III - IV ([place]) 06/28/2021   Chronic HFrEF (heart failure with reduced ejection fraction) ([place]) 03/31/2021   Arrhythmia    DM (diabetes mellitus), type 2 ([place]) new 10/26/2020   Urinary retention 10/26/2020   Hyperlipidemia LDL goal <70 10/26/2020   Coronary artery disease of native artery of native heart with stable angina pectoris ([place]) 10/26/2020   S/P angioplasty with stent 10/04/20 DES to LCX  10/26/2020   Ischemic cardiomyopathy 10/26/2020   Diabetes mellitus, type 2 ([place]) 10/06/2020   DOE (dyspnea on exertion)    Meningioma ([place]) 08/14/2020   Vestibular schwannoma ([place]) 08/14/2020   Cardiac arrest with ventricular fibrillation (HCC)    Pain of left hip joint 05/29/2020   Benign brain tumor ([place])    Chronic kidney disease    History of pulmonary embolus (PE)    Hypertension     Nonsustained ventricular tachycardia ([place]) 10/11/2019   Dizziness 10/11/2019   Abnormal echocardiogram 06/07/2019   Pulmonary hypertension due to thromboembolism ([place]) 03/09/2018   Solitary pulmonary nodule on lung CT 03/08/2018   Ascending aortic aneurysm (HCC) 4.2 cm based on CT from December 2020 01/29/2018   Sinus bradycardia 12/22/2017   Left bundle branch block 12/19/2017   Personal history of pulmonary embolism - no longer on anticoagulation due to pt refusal to take it anymore. 12/19/2017   Chronic kidney disease, stage III (moderate) ([place]) 12/19/2017   Benign neoplasm of brain ([place]) 12/19/2017    PCP: Cari Caraway, MD   REFERRING PROVIDER: Cari Caraway, MD   REFERRING DIAG: Falls, unsteady gait, mm weakness Rationale for Evaluation and Treatment Rehabilitation  THERAPY DIAG:  Unsteadiness on feet  Right sided weakness  Meningioma [facility])  Other lack of coordination  Muscle weakness (generalized)  Repeated falls  ONSET DATE: 05/10/2022   SUBJECTIVE:                                                                                                                                                                                           SUBJECTIVE STATEMENT: Patient reports knee pain is improved. No new issues.  PERTINENT HISTORY:  Brief HPI:   Imad Kris is a 77 y.o. male who fell backward striking his head and right neck shoulder area on 01/21/2022. He complained of right hemiparesis. CT head revealed left parafalcine meningioma.Started on Decadron with improvement in symptoms   PAIN:  Are you having pain? no PRECAUTIONS: ICD/Pacemaker  WEIGHT BEARING RESTRICTIONS No  FALLS:  Has patient fallen in last 6 months? Yes. Number of falls 2  LIVING ENVIRONMENT: Lives with: lives with their spouse Lives in: House/apartment Stairs: Yes: External: 7 steps; bilateral but cannot reach both Has following equipment at home: Walker - 2 wheeled and a cane,  shower seat  OCCUPATION: Retired  PLOF: Independent  PATIENT GOALS Feel normal with standing and functioning.  Feel stronger and have better balance   OBJECTIVE:   DIAGNOSTIC FINDINGS:  CT Lumbar- 3. Moderate degenerative spinal stenosis at L2-3. CT CERVICAL IMPRESSION: 1. No evidence of intracranial injury. 2. Negative for cervical spine fracture but there is prevertebral edema at C4-5 suggesting soft tissue injury. 3. Meningiomas with left cerebral of edema, reference brain MRI 01/22/2022.  SCREENING FOR RED FLAGS: Bowel or bladder incontinence: No Spinal tumors: No Cauda equina syndrome: No Compression fracture: No Abdominal aneurysm: Yes: Abdominal aortic aneurysm, stable  COGNITION:  Overall cognitive status: WFL     SENSATION: WFL Patient reports tingling in B hands, all over.  MUSCLE LENGTH: Hamstrings: approximately 55 degrees B.  Was 70 degrees in September 2023  POSTURE: rounded shoulders and forward head  PALPATION: No TTP in R shoulder or up traps. B up traps tight, low back TTP R and L proximal to [place] crest  LUMBAR ROM:  AROM limited in all directions, but he lost his balance frequently, so accuracy of testing limited.   LOWER EXTREMITY ROM:      General mild stiffness throughout BLE with PROM.  LOWER EXTREMITY MMT:   MMT Right eval Left eval  Hip flexion 4- 4-  Hip extension    Hip abduction 4 4  Hip adduction    Hip internal rotation    Hip external rotation    Knee flexion 4+ 4-  Knee extension 4+ 4-  Ankle dorsiflexion 4 4  Ankle plantarflexion    Ankle inversion    Ankle  eversion     (Blank rows = not tested)  LUMBAR SPECIAL TESTS:  Slump test: Positive R  Shoulder Special Tests empty can test (+) R, Hawkins-Kennedy test (+) R. Previous MRI reveals full tear of supraspinatus and partial tear of infraspinatus.  FUNCTIONAL TESTS:  5 times sit to stand: 30 seconds struggled with weak knees Timed up and go (TUG): 27 seconds  with SPC   TUG 10/12/22 = 13 seconds without device  Functional gait assessment: not tested BERG balance test:  23/56, 10/12/22 =29/56  GAIT: Distance walked: 80 Assistive device utilized: SPC, very unsteady used walls to stabilize, knees very shaky and unsteady, needing CGA to min A Level of assistance: CGA to min A for LOB Comments: Patient was unsteady with turns, relied on UE support for balance., legs shaky, presents like he has neuropathy but he does not, also reports double vision  TODAY'S TREATMENT  11/24/22 NuStep L5 x 6 minutes Standing with toes on slightly ramped surface at end of parallel bars. First static, then performed ant/post weight shifts, no UE support. Demonstrated good balance and control, with occasional unsteadiness backwards, but recovered with CGA. Sit to stand from elevated mat with toes on elevated slightly. 3 x 5 reps. Unable to lower mat due to increased knee pain when lower. Standing on Airex pad, alternate marching, with bars for safety. He initially could not lift either foot off the pad, but progressed to slow marching with occasional LOB, mild. Attempted quick side to side step, but he was too unstable and unsafe. Bike-3 x 30 sec fast pedal, > 80 RPM  11/22/22 NuStep L4 x 6 minutes. No problems/pain in knee, using BUE and LE. Seated B knee flexion 35#, 3 x 10 reps Seated knee ext 10#, 3 x 10 reps-minimal L knee pain at initiation. L knee assessment- MCL/LCL intact, McMurray's test (-) TTP lateral femoral epicondyle. Standing ITB stretch, caused L hip pain from his previous fall 6 months ago Standing hip abd against G tband at ankles, 2 x 10 each, Mod TC and VC for form. Alternating step taps without UE support, CGA, unsteady, but no LOB Multiple taps with each foot required occasional min a due to LOB Lateral step taps, CGA.   11/17/22 NuStep L5 x 6 minutes Standing in parallel bars- crossover step with weight shift and back, R/L, cross behind and back,  and SLS. He required UE support for each exercise. Did show minimal improvement in stability with repetition of each activity. Step ups- with 2# weights on each ankle- 10 forward on 6" step, 10 side step on 4" step, all with U or BUE support. Too unsteady to try crossover step. B side stepping against G tband resistance, 6 x 4 steps each way. Moderately unsteady with multiple episodes of LOB. Seated knee flexion BLE, 25#, 2 x 15 reps Seated knee ext against 10#, 2 x 15 reps Leg press, 20#, 2 x 10 reps.  \11/10/22 Nustep level 5 x 6 minutes Gait out the back door, curb, around the small parking island and then to the front door with gait belt and CGA Side stepping on the airex balance beam 2.5# march, hip abduction and extension Cone toe touches on and off airex with two SPC's Side stepping ball toss On airex head turns and volleyball in a corner On airex butt touches with back to wall  11/08/22 Nustep level 5 x 6 minutes Direction changes Walking figure 8's on the mat Walking across the mat without changing speed  Direction changes In pbars 2.5# hip march, abduction and extension Side stepping on the airex balance beam On airex back to wall bottom touches and shoulder blade touches to the wall for balance and hip strategies On airex head turns Holding cane 12" toe touches and then cone toe touches volleyball  20# farmer carry Side stepping over 3 sticks  11/03/22 NuStep L5 x 6 minutes. Proprioceptive training on Air ex-Static standing, then ant/post and lateral weight shifts. Shaky throughout, but no true LOB. Seated ankle strength and stability exercises-inversion/ball squeeze, eversion, DF against G tband, and PF against Black Tband, 10 x 5 sec each exer, B. Standing rocker board in the parallel bars, very shaky, difficult to weight shift both ant/post and lat. Ambulation with head turns, walking backwards, high steps. Unsteady, occasional min A for balance.   10/27/22 NuStep L5  x 6 minutes, U and LE, then attempted 30 sec at L8 for strength, legs only. Patient reported R knee pain, so tried to decrease resistance to L7, still had pain, so deferred. Assessed for ITB tightness, with pain noted mid thigh and proximally, with tightness. STM to R ITB F/B stretch to ITB and R hip as well. Sit to/from stand from mat with emphasis on slow decent x 10. Seated knee flex, BLE 35#, 2 x 10 Seated eccentric knee ext, 10#, 1 x 10 with each Upside down BOSU in parallel bars. Attempting static balance, 2 minutes. Max of ~10 sec. Wall bumps started at 4", progressed to approximately 12". Very stable and controlled in posterior weight shifts.   PATIENT EDUCATION:  Education details: POC,  Person educated: Patient and Spouse Education method: Explanation Education comprehension: verbalized understanding   HOME EXERCISE PROGRAM: Access Code: [email or web address] URL: [email or web address] Date: 10/12/2022 Prepared by: Lum Babe  Exercises - Seated Isometric Cervical Sidebending  - 1 x daily - 7 x weekly - 2 sets - 10 reps - 3 hold - Seated Isometric Cervical Rotation  - 1 x daily - 7 x weekly - 2 sets - 10 reps - 3 hold  [email or web address]   ASSESSMENT:  CLINICAL IMPRESSION: Patient reports improved L knee pain. Treatment focused on speed of movement and balance reactions, particularly posterior balance reactions. He demonstrates continued unsteadiness and instability, but improved balance reactions.  OBJECTIVE IMPAIRMENTS Abnormal gait, decreased activity tolerance, decreased balance, decreased cognition, decreased coordination, difficulty walking, decreased ROM, decreased strength, increased muscle spasms, impaired flexibility, postural dysfunction, and pain.   ACTIVITY LIMITATIONS carrying, lifting, bending, standing, squatting, stairs, reach over head, and locomotion level  PARTICIPATION LIMITATIONS: cleaning, laundry, driving, shopping, and community  activity  PERSONAL FACTORS Age and 1 comorbidity: multiple meningiomas  are also affecting patient's functional outcome.   REHAB POTENTIAL: Good  CLINICAL DECISION MAKING: Evolving/moderate complexity  EVALUATION COMPLEXITY: Moderate   GOALS: Goals reviewed with patient? Yes  SHORT TERM GOALS: Target date: 10/04/22  I with initial HEP Baseline: Goal status: 10/21/22-met  LONG TERM GOALS: Target date: 12/20/22  I with final HEP Baseline:  Goal status: ongoing 11/10/22  2.  Patient will have no falls over a 6 week period Baseline: 2 falls  Goal status:met 11/08/22  3.  Complete 5 x STS in <12 sec to demonstrate improved LE strength Baseline: 30 seconds Goal status: ongoing  4.  Patient will complete TUG in < 12 sec with or without AD Baseline: 27 seconds Goal status: progressing  5.  Patient will score at least 43/56 on Berg balance Baseline: 23/56 Goal status: progressing  6.  Patient will walk at least 600' with LRAD, MI on level an unlevel surfaces, back pain < 3/10 Baseline: 80', unsteady, SPC with CGA/Min A Goal status: progressing   PLAN: PT FREQUENCY: 2x/week  PT DURATION: 12 weeks  PLANNED INTERVENTIONS: Therapeutic exercises, Therapeutic activity, Neuromuscular re-education, Balance training, Gait training, Patient/Family education, Self Care, Joint mobilization, Stair training, Dry Needling, Electrical stimulation, Cryotherapy, Moist heat, Ultrasound, Ionotophoresis '4mg'$ /ml Dexamethasone, and Manual therapy.  PLAN FOR NEXT SESSION:   continue with the balance  Ethel Rana DPT 11/24/22 1:51 PM

## 2022-11-28 DIAGNOSIS — I129 Hypertensive chronic kidney disease with stage 1 through stage 4 chronic kidney disease, or unspecified chronic kidney disease: Secondary | ICD-10-CM | POA: Diagnosis not present

## 2022-11-28 DIAGNOSIS — N189 Chronic kidney disease, unspecified: Secondary | ICD-10-CM | POA: Diagnosis not present

## 2022-11-28 DIAGNOSIS — E872 Acidosis, unspecified: Secondary | ICD-10-CM | POA: Diagnosis not present

## 2022-11-28 DIAGNOSIS — I5022 Chronic systolic (congestive) heart failure: Secondary | ICD-10-CM | POA: Diagnosis not present

## 2022-11-28 DIAGNOSIS — D631 Anemia in chronic kidney disease: Secondary | ICD-10-CM | POA: Diagnosis not present

## 2022-11-28 DIAGNOSIS — N1832 Chronic kidney disease, stage 3b: Secondary | ICD-10-CM | POA: Diagnosis not present

## 2022-11-28 DIAGNOSIS — N179 Acute kidney failure, unspecified: Secondary | ICD-10-CM | POA: Diagnosis not present

## 2022-11-28 DIAGNOSIS — N400 Enlarged prostate without lower urinary tract symptoms: Secondary | ICD-10-CM | POA: Diagnosis not present

## 2022-12-01 ENCOUNTER — Ambulatory Visit: Payer: Medicare Other | Admitting: Physical Therapy

## 2022-12-01 ENCOUNTER — Encounter: Payer: Self-pay | Admitting: Physical Therapy

## 2022-12-01 DIAGNOSIS — R278 Other lack of coordination: Secondary | ICD-10-CM

## 2022-12-01 DIAGNOSIS — R296 Repeated falls: Secondary | ICD-10-CM

## 2022-12-01 DIAGNOSIS — R2681 Unsteadiness on feet: Secondary | ICD-10-CM

## 2022-12-01 DIAGNOSIS — R531 Weakness: Secondary | ICD-10-CM | POA: Diagnosis not present

## 2022-12-01 DIAGNOSIS — M6281 Muscle weakness (generalized): Secondary | ICD-10-CM

## 2022-12-01 DIAGNOSIS — D329 Benign neoplasm of meninges, unspecified: Secondary | ICD-10-CM | POA: Diagnosis not present

## 2022-12-01 NOTE — Therapy (Signed)
OUTPATIENT PHYSICAL THERAPY THORACOLUMBAR TREATMENT   Patient Name: Timothy Moran MRN: [id number] DOB:[birth year], 77 y.o., male Today's Date: 12/01/2022   PT End of Session - 12/01/22 1103     Visit Number 18    Date for PT Re-Evaluation 12/20/22    PT Start Time 1100    PT Stop Time 1140    PT Time Calculation (min) 40 min    Equipment Utilized During Treatment Gait belt    Activity Tolerance Patient tolerated treatment well    Behavior During Therapy [facility] for tasks assessed/performed                 Past Medical History:  Diagnosis Date   Acute blood loss anemia 10/05/2020   Acute respiratory failure ([place]), hypothermia therapy, vent - extubated 10/06/20    AKI (acute kidney injury) ([place]) 10/05/2020   Anoxic brain injury ([place]) 10/26/2020   Arrhythmia    Ascending aortic aneurysm ([place]) 01/29/2018   a. 2019 4.3 cm by echo; b. 02/2021 Echo: Ao root 31mm.   Benign brain tumor ([place])    Benign neoplasm of brain ([place]) 12/19/2017   Cardiac arrest with ventricular fibrillation ([place])    Chest pain 12/04/2017   Chronic kidney disease, stage III (moderate) ([place]) 12/19/2017   CKD (chronic kidney disease), stage III - IV (HCC)    Coronary Artery Disease    a. 0000000 Inf STEMI complicated by cardiac arrest/PCI: LAD 50p/m, LCX 100d (2.5 x 30 Resolute Onyx DES); b. 04/2021 PCI: 04/2021 LM nl, LAD 50p/73m (2.5x30 Onyx Frontier DES), LCX 25p, 76m (RFR 0.98), 40d ISR (RFR 0.98), OM2 25, RCA mild diff dzs.   Diabetes mellitus, type 2 ([place]) 10/06/2020   10/06/20 A1C 7%   Dizziness 10/11/2019   Encounter for imaging study to confirm orogastric (OG) tube placement    Hematuria 10/26/2020   HFrEF (heart failure with reduced ejection fraction) ([place])    a. 11/2020 Echo: EF 25-30%; b. 02/2021 Echo: EF 25-30%, glob HK. Mild LVH. Nl RV fxn. Triv AI. Ao root 60mm.   History of pulmonary embolus (PE)    HLD (hyperlipidemia) 10/26/2020   Hypertension    Iron deficiency anemia rec'd IV iron  10/26/2020   Ischemic cardiomyopathy    a. 11/2020 Echo: EF 25-30%; b. 02/2021 Echo: EF 25-30%, glob HK.   Left bundle branch block 12/19/2017   Meningioma ([place]) 08/14/2020   Nonsustained ventricular tachycardia ([place]) 10/11/2019   Pain of left hip joint 05/29/2020   Personal history of pulmonary embolism 12/19/2017   Pneumonia of both lungs due to infectious organism    Pulmonary hypertension due to thromboembolism ([place]) 03/09/2018   See echo 12/27/17 with nl PAS vs CTa chest 02/23/18    S/P angioplasty with stent 10/04/20 DES to LCX  10/26/2020   Sinus bradycardia 12/22/2017   SOB (shortness of breath)    Solitary pulmonary nodule on lung CT 03/08/2018   CT 12/04/17 1.0 x 0.8 x 0.7 cm nodular opacity in the RUL vs not seen 03/16/10 posterior segment of the right upper lobe. Marland KitchenSpirometry 03/08/2018    FEV1 3.86 (108%)  Ratio 96 s prior rx  - PET  03/13/18   Low grade c/w adenoca > rec T surgery eval     STEMI (ST elevation myocardial infarction) ([place]) 10/04/2020   Urinary retention 10/26/2020   Vestibular schwannoma ([place]) 08/14/2020   Past Surgical History:  Procedure Laterality Date   APPENDECTOMY     BIV ICD INSERTION CRT-D N/A 11/03/2021   Procedure:  BIV ICD INSERTION CRT-D;  Surgeon: Constance Haw, MD;  Location: [place] [facility];  Service: Cardiovascular;  Laterality: N/A;   CARDIAC CATHETERIZATION     CORONARY ANGIOPLASTY WITH STENT PLACEMENT Left 04/12/2021   LAD stent placement   CORONARY STENT INTERVENTION N/A 04/12/2021   Procedure: CORONARY STENT INTERVENTION;  Surgeon: Nelva Bush, MD;  Location: [place] [facility];  Service: Cardiovascular;  Laterality: N/A;   CORONARY/GRAFT ACUTE MI REVASCULARIZATION N/A 10/04/2020   Procedure: Coronary/Graft Acute MI Revascularization;  Surgeon: Nelva Bush, MD;  Location: [place] [facility];  Service: Cardiovascular;  Laterality: N/A;   HERNIA REPAIR     INTRAVASCULAR PRESSURE WIRE/FFR STUDY N/A 04/12/2021   Procedure:  INTRAVASCULAR PRESSURE WIRE/FFR STUDY;  Surgeon: Nelva Bush, MD;  Location: [place] [facility];  Service: Cardiovascular;  Laterality: N/A;   INTRAVASCULAR ULTRASOUND/IVUS N/A 04/12/2021   Procedure: Intravascular Ultrasound/IVUS;  Surgeon: Nelva Bush, MD;  Location: [place] [facility];  Service: Cardiovascular;  Laterality: N/A;   LEFT HEART CATH AND CORONARY ANGIOGRAPHY N/A 10/04/2020   Procedure: LEFT HEART CATH AND CORONARY ANGIOGRAPHY;  Surgeon: Nelva Bush, MD;  Location: [place] [facility];  Service: Cardiovascular;  Laterality: N/A;   RIGHT/LEFT HEART CATH AND CORONARY ANGIOGRAPHY N/A 04/12/2021   Procedure: RIGHT/LEFT HEART CATH AND CORONARY ANGIOGRAPHY;  Surgeon: Nelva Bush, MD;  Location: [place] [facility];  Service: Cardiovascular;  Laterality: N/A;   Patient Active Problem List   Diagnosis Date Noted   Anemia    Leukocytosis    Right arm weakness 01/25/2022   Right sided weakness    Fall at home 01/22/2022   Status post biventricular pacemaker - MRI compatible PPM and leads 01/22/2022   Chronic indwelling Foley catheter 01/22/2022   CKD (chronic kidney disease), stage III - IV ([place]) 06/28/2021   Chronic HFrEF (heart failure with reduced ejection fraction) ([place]) 03/31/2021   Arrhythmia    DM (diabetes mellitus), type 2 ([place]) new 10/26/2020   Urinary retention 10/26/2020   Hyperlipidemia LDL goal <70 10/26/2020   Coronary artery disease of native artery of native heart with stable angina pectoris ([place]) 10/26/2020   S/P angioplasty with stent 10/04/20 DES to LCX  10/26/2020   Ischemic cardiomyopathy 10/26/2020   Diabetes mellitus, type 2 ([place]) 10/06/2020   DOE (dyspnea on exertion)    Meningioma ([place]) 08/14/2020   Vestibular schwannoma ([place]) 08/14/2020   Cardiac arrest with ventricular fibrillation (HCC)    Pain of left hip joint 05/29/2020   Benign brain tumor ([place])    Chronic kidney disease    History of pulmonary embolus (PE)    Hypertension     Nonsustained ventricular tachycardia ([place]) 10/11/2019   Dizziness 10/11/2019   Abnormal echocardiogram 06/07/2019   Pulmonary hypertension due to thromboembolism ([place]) 03/09/2018   Solitary pulmonary nodule on lung CT 03/08/2018   Ascending aortic aneurysm (HCC) 4.2 cm based on CT from December 2020 01/29/2018   Sinus bradycardia 12/22/2017   Left bundle branch block 12/19/2017   Personal history of pulmonary embolism - no longer on anticoagulation due to pt refusal to take it anymore. 12/19/2017   Chronic kidney disease, stage III (moderate) ([place]) 12/19/2017   Benign neoplasm of brain ([place]) 12/19/2017    PCP: Cari Caraway, MD   REFERRING PROVIDER: Cari Caraway, MD   REFERRING DIAG: Falls, unsteady gait, mm weakness Rationale for Evaluation and Treatment Rehabilitation  THERAPY DIAG:  Unsteadiness on feet  Right sided weakness  Meningioma ([place])  Other lack of  coordination  Repeated falls  Muscle weakness (generalized)  ONSET DATE: 05/10/2022   SUBJECTIVE:                                                                                                                                                                                           SUBJECTIVE STATEMENT: Patient reports no new issues.  PERTINENT HISTORY:  Brief HPI:   Marquis Eckerd is a 77 y.o. male who fell backward striking his head and right neck shoulder area on 01/21/2022. He complained of right hemiparesis. CT head revealed left parafalcine meningioma.Started on Decadron with improvement in symptoms   PAIN:  Are you having pain? no PRECAUTIONS: ICD/Pacemaker  WEIGHT BEARING RESTRICTIONS No  FALLS:  Has patient fallen in last 6 months? Yes. Number of falls 2  LIVING ENVIRONMENT: Lives with: lives with their spouse Lives in: House/apartment Stairs: Yes: External: 7 steps; bilateral but cannot reach both Has following equipment at home: Walker - 2 wheeled and a cane, shower seat  OCCUPATION:  Retired  PLOF: Independent  PATIENT GOALS Feel normal with standing and functioning.  Feel stronger and have better balance   OBJECTIVE:   DIAGNOSTIC FINDINGS:  CT Lumbar- 3. Moderate degenerative spinal stenosis at L2-3. CT CERVICAL IMPRESSION: 1. No evidence of intracranial injury. 2. Negative for cervical spine fracture but there is prevertebral edema at C4-5 suggesting soft tissue injury. 3. Meningiomas with left cerebral of edema, reference brain MRI 01/22/2022.  SCREENING FOR RED FLAGS: Bowel or bladder incontinence: No Spinal tumors: No Cauda equina syndrome: No Compression fracture: No Abdominal aneurysm: Yes: Abdominal aortic aneurysm, stable  COGNITION:  Overall cognitive status: WFL     SENSATION: WFL Patient reports tingling in B hands, all over.  MUSCLE LENGTH: Hamstrings: approximately 55 degrees B.  Was 70 degrees in September 2023  POSTURE: rounded shoulders and forward head  PALPATION: No TTP in R shoulder or up traps. B up traps tight, low back TTP R and L proximal to Illiac crest  LUMBAR ROM:  AROM limited in all directions, but he lost his balance frequently, so accuracy of testing limited.   LOWER EXTREMITY ROM:      General mild stiffness throughout BLE with PROM.  LOWER EXTREMITY MMT:   MMT Right eval Left eval  Hip flexion 4- 4-  Hip extension    Hip abduction 4 4  Hip adduction    Hip internal rotation    Hip external rotation    Knee flexion 4+ 4-  Knee extension 4+ 4-  Ankle dorsiflexion 4 4  Ankle plantarflexion    Ankle inversion    Ankle eversion     (  Blank rows = not tested)  LUMBAR SPECIAL TESTS:  Slump test: Positive R  Shoulder Special Tests empty can test (+) R, Hawkins-Kennedy test (+) R. Previous MRI reveals full tear of supraspinatus and partial tear of infraspinatus.  FUNCTIONAL TESTS:  5 times sit to stand: 30 seconds struggled with weak knees Timed up and go (TUG): 27 seconds with SPC   TUG 10/12/22 = 13  seconds without device  Functional gait assessment: not tested BERG balance test:  23/56, 10/12/22 =29/56  GAIT: Distance walked: 80 Assistive device utilized: SPC, very unsteady used walls to stabilize, knees very shaky and unsteady, needing CGA to min A Level of assistance: CGA to min A for LOB Comments: Patient was unsteady with turns, relied on UE support for balance., legs shaky, presents like he has neuropathy but he does not, also reports double vision  TODAY'S TREATMENT  12/01/22 Bike L3 x 5 min, then 3 x 30 sec fast pedal with rest breaks. Seated chin tucks with ball on wall. B shoulder ext, rows, ER 2 x 10 with G tband Cervical rotation with chin tuck holding ball on wall. Cervical flexion with ball on wall, 10 each Standing hip abd against physioball on wall, 10 reps each side Mini Squats, reaching to tap 5# weight on target 17" off floor, 2 x 10, TC for appropriate posture.  11/24/22 NuStep L5 x 6 minutes Standing with toes on slightly ramped surface at end of parallel bars. First static, then performed ant/post weight shifts, no UE support. Demonstrated good balance and control, with occasional unsteadiness backwards, but recovered with CGA. Sit to stand from elevated mat with toes on elevated slightly. 3 x 5 reps. Unable to lower mat due to increased knee pain when lower. Standing on Airex pad, alternate marching, with bars for safety. He initially could not lift either foot off the pad, but progressed to slow marching with occasional LOB, mild. Attempted quick side to side step, but he was too unstable and unsafe. Bike-3 x 30 sec fast pedal, > 80 RPM  11/22/22 NuStep L4 x 6 minutes. No problems/pain in knee, using BUE and LE. Seated B knee flexion 35#, 3 x 10 reps Seated knee ext 10#, 3 x 10 reps-minimal L knee pain at initiation. L knee assessment- MCL/LCL intact, McMurray's test (-) TTP lateral femoral epicondyle. Standing ITB stretch, caused L hip pain from his previous  fall 6 months ago Standing hip abd against G tband at ankles, 2 x 10 each, Mod TC and VC for form. Alternating step taps without UE support, CGA, unsteady, but no LOB Multiple taps with each foot required occasional min a due to LOB Lateral step taps, CGA.   11/17/22 NuStep L5 x 6 minutes Standing in parallel bars- crossover step with weight shift and back, R/L, cross behind and back, and SLS. He required UE support for each exercise. Did show minimal improvement in stability with repetition of each activity. Step ups- with 2# weights on each ankle- 10 forward on 6" step, 10 side step on 4" step, all with U or BUE support. Too unsteady to try crossover step. B side stepping against G tband resistance, 6 x 4 steps each way. Moderately unsteady with multiple episodes of LOB. Seated knee flexion BLE, 25#, 2 x 15 reps Seated knee ext against 10#, 2 x 15 reps Leg press, 20#, 2 x 10 reps.  \11/10/22 Nustep level 5 x 6 minutes Gait out the back door, curb, around the small parking island and  then to the front door with gait belt and CGA Side stepping on the airex balance beam 2.5# march, hip abduction and extension Cone toe touches on and off airex with two SPC's Side stepping ball toss On airex head turns and volleyball in a corner On airex butt touches with back to wall  11/08/22 Nustep level 5 x 6 minutes Direction changes Walking figure 8's on the mat Walking across the mat without changing speed Direction changes In pbars 2.5# hip march, abduction and extension Side stepping on the airex balance beam On airex back to wall bottom touches and shoulder blade touches to the wall for balance and hip strategies On airex head turns Holding cane 12" toe touches and then cone toe touches volleyball  20# farmer carry Side stepping over 3 sticks  11/03/22 NuStep L5 x 6 minutes. Proprioceptive training on Air ex-Static standing, then ant/post and lateral weight shifts. Shaky throughout, but  no true LOB. Seated ankle strength and stability exercises-inversion/ball squeeze, eversion, DF against G tband, and PF against Black Tband, 10 x 5 sec each exer, B. Standing rocker board in the parallel bars, very shaky, difficult to weight shift both ant/post and lat. Ambulation with head turns, walking backwards, high steps. Unsteady, occasional min A for balance.   10/27/22 NuStep L5 x 6 minutes, U and LE, then attempted 30 sec at L8 for strength, legs only. Patient reported R knee pain, so tried to decrease resistance to L7, still had pain, so deferred. Assessed for ITB tightness, with pain noted mid thigh and proximally, with tightness. STM to R ITB F/B stretch to ITB and R hip as well. Sit to/from stand from mat with emphasis on slow decent x 10. Seated knee flex, BLE 35#, 2 x 10 Seated eccentric knee ext, 10#, 1 x 10 with each Upside down BOSU in parallel bars. Attempting static balance, 2 minutes. Max of ~10 sec. Wall bumps started at 4", progressed to approximately 12". Very stable and controlled in posterior weight shifts.   PATIENT EDUCATION:  Education details: POC,  Person educated: Patient and Spouse Education method: Explanation Education comprehension: verbalized understanding   HOME EXERCISE PROGRAM: Access Code: [email or web address] URL: [email or web address] Date: 10/12/2022 Prepared by: Lum Babe  Exercises - Seated Isometric Cervical Sidebending  - 1 x daily - 7 x weekly - 2 sets - 10 reps - 3 hold - Seated Isometric Cervical Rotation  - 1 x daily - 7 x weekly - 2 sets - 10 reps - 3 hold  [email or web address]   ASSESSMENT:  CLINICAL IMPRESSION: Patient reports no changes. His R shoulder is a bit sore and he feels unbalanced. Treatment addressed multiple areas including scapular stability, balance, and LE strength.   OBJECTIVE IMPAIRMENTS Abnormal gait, decreased activity tolerance, decreased balance, decreased cognition, decreased coordination, difficulty  walking, decreased ROM, decreased strength, increased muscle spasms, impaired flexibility, postural dysfunction, and pain.   ACTIVITY LIMITATIONS carrying, lifting, bending, standing, squatting, stairs, reach over head, and locomotion level  PARTICIPATION LIMITATIONS: cleaning, laundry, driving, shopping, and community activity  PERSONAL FACTORS Age and 1 comorbidity: multiple meningiomas  are also affecting patient's functional outcome.   REHAB POTENTIAL: Good  CLINICAL DECISION MAKING: Evolving/moderate complexity  EVALUATION COMPLEXITY: Moderate   GOALS: Goals reviewed with patient? Yes  SHORT TERM GOALS: Target date: 10/04/22  I with initial HEP Baseline: Goal status: 10/21/22-met  LONG TERM GOALS: Target date: 12/20/22  I with final HEP Baseline:  Goal status: ongoing 11/10/22  2.  Patient will have no  falls over a 6 week period Baseline: 2 falls  Goal status:met 11/08/22  3.  Complete 5 x STS in <12 sec to demonstrate improved LE strength Baseline: 30 seconds Goal status: ongoing  4.  Patient will complete TUG in < 12 sec with or without AD Baseline: 27 seconds Goal status: progressing  5.  Patient will score at least 43/56 on Berg balance Baseline: 23/56 Goal status: progressing  6.  Patient will walk at least 600' with LRAD, MI on level an unlevel surfaces, back pain < 3/10 Baseline: 80', unsteady, SPC with CGA/Min A Goal status: progressing   PLAN: PT FREQUENCY: 2x/week  PT DURATION: 12 weeks  PLANNED INTERVENTIONS: Therapeutic exercises, Therapeutic activity, Neuromuscular re-education, Balance training, Gait training, Patient/Family education, Self Care, Joint mobilization, Stair training, Dry Needling, Electrical stimulation, Cryotherapy, Moist heat, Ultrasound, Ionotophoresis 4mg /ml Dexamethasone, and Manual therapy.  PLAN FOR NEXT SESSION:   continue with the balance  Ethel Rana DPT 12/01/22 11:41 AM

## 2022-12-07 ENCOUNTER — Encounter: Payer: Self-pay | Admitting: Physical Therapy

## 2022-12-07 ENCOUNTER — Ambulatory Visit: Payer: Medicare Other | Admitting: Physical Therapy

## 2022-12-07 DIAGNOSIS — M6281 Muscle weakness (generalized): Secondary | ICD-10-CM | POA: Diagnosis not present

## 2022-12-07 DIAGNOSIS — R296 Repeated falls: Secondary | ICD-10-CM

## 2022-12-07 DIAGNOSIS — R2681 Unsteadiness on feet: Secondary | ICD-10-CM

## 2022-12-07 DIAGNOSIS — D329 Benign neoplasm of meninges, unspecified: Secondary | ICD-10-CM | POA: Diagnosis not present

## 2022-12-07 DIAGNOSIS — R278 Other lack of coordination: Secondary | ICD-10-CM | POA: Diagnosis not present

## 2022-12-07 DIAGNOSIS — R531 Weakness: Secondary | ICD-10-CM | POA: Diagnosis not present

## 2022-12-07 NOTE — Therapy (Signed)
OUTPATIENT PHYSICAL THERAPY THORACOLUMBAR TREATMENT   Patient Name: Timothy Moran MRN: [id number] DOB:May 10, 1946, 77 y.o., male Today's Date: 12/07/2022   PT End of Session - 12/07/22 1013     Visit Number 19    Date for PT Re-Evaluation 12/20/22    PT Start Time 1011    PT Stop Time 1053    PT Time Calculation (min) 42 min    Equipment Utilized During Treatment Gait belt    Activity Tolerance Patient tolerated treatment well    Behavior During Therapy [facility] for tasks assessed/performed                 Past Medical History:  Diagnosis Date   Acute blood loss anemia 10/05/2020   Acute respiratory failure ([place]), hypothermia therapy, vent - extubated 10/06/20    AKI (acute kidney injury) ([place]) 10/05/2020   Anoxic brain injury ([place]) 10/26/2020   Arrhythmia    Ascending aortic aneurysm ([place]) 01/29/2018   a. 2019 4.3 cm by echo; b. 02/2021 Echo: Ao root 19mm.   Benign brain tumor ([place])    Benign neoplasm of brain ([place]) 12/19/2017   Cardiac arrest with ventricular fibrillation ([place])    Chest pain 12/04/2017   Chronic kidney disease, stage III (moderate) ([place]) 12/19/2017   CKD (chronic kidney disease), stage III - IV (HCC)    Coronary Artery Disease    a. 0000000 Inf STEMI complicated by cardiac arrest/PCI: LAD 50p/m, LCX 100d (2.5 x 30 Resolute Onyx DES); b. 04/2021 PCI: 04/2021 LM nl, LAD 50p/26m (2.5x30 Onyx Frontier DES), LCX 25p, 27m (RFR 0.98), 40d ISR (RFR 0.98), OM2 25, RCA mild diff dzs.   Diabetes mellitus, type 2 ([place]) 10/06/2020   10/06/20 A1C 7%   Dizziness 10/11/2019   Encounter for imaging study to confirm orogastric (OG) tube placement    Hematuria 10/26/2020   HFrEF (heart failure with reduced ejection fraction) ([place])    a. 11/2020 Echo: EF 25-30%; b. 02/2021 Echo: EF 25-30%, glob HK. Mild LVH. Nl RV fxn. Triv AI. Ao root 32mm.   History of pulmonary embolus (PE)    HLD (hyperlipidemia) 10/26/2020   Hypertension    Iron deficiency anemia rec'd IV iron  10/26/2020   Ischemic cardiomyopathy    a. 11/2020 Echo: EF 25-30%; b. 02/2021 Echo: EF 25-30%, glob HK.   Left bundle branch block 12/19/2017   Meningioma ([place]) 08/14/2020   Nonsustained ventricular tachycardia ([place]) 10/11/2019   Pain of left hip joint 05/29/2020   Personal history of pulmonary embolism 12/19/2017   Pneumonia of both lungs due to infectious organism    Pulmonary hypertension due to thromboembolism ([place]) 03/09/2018   See echo 12/27/17 with nl PAS vs CTa chest 02/23/18    S/P angioplasty with stent 10/04/20 DES to LCX  10/26/2020   Sinus bradycardia 12/22/2017   SOB (shortness of breath)    Solitary pulmonary nodule on lung CT 03/08/2018   CT 12/04/17 1.0 x 0.8 x 0.7 cm nodular opacity in the RUL vs not seen 03/16/10 posterior segment of the right upper lobe. Marland KitchenSpirometry 03/08/2018    FEV1 3.86 (108%)  Ratio 96 s prior rx  - PET  03/13/18   Low grade c/w adenoca > rec T surgery eval     STEMI (ST elevation myocardial infarction) ([place]) 10/04/2020   Urinary retention 10/26/2020   Vestibular schwannoma ([place]) 08/14/2020   Past Surgical History:  Procedure Laterality Date   APPENDECTOMY     BIV ICD INSERTION CRT-D N/A 11/03/2021   Procedure:  BIV ICD INSERTION CRT-D;  Surgeon: Constance Haw, MD;  Location: [place] [facility];  Service: Cardiovascular;  Laterality: N/A;   CARDIAC CATHETERIZATION     CORONARY ANGIOPLASTY WITH STENT PLACEMENT Left 04/12/2021   LAD stent placement   CORONARY STENT INTERVENTION N/A 04/12/2021   Procedure: CORONARY STENT INTERVENTION;  Surgeon: Nelva Bush, MD;  Location: [place] [facility];  Service: Cardiovascular;  Laterality: N/A;   CORONARY/GRAFT ACUTE MI REVASCULARIZATION N/A 10/04/2020   Procedure: Coronary/Graft Acute MI Revascularization;  Surgeon: Nelva Bush, MD;  Location: [place] [facility];  Service: Cardiovascular;  Laterality: N/A;   HERNIA REPAIR     INTRAVASCULAR PRESSURE WIRE/FFR STUDY N/A 04/12/2021   Procedure:  INTRAVASCULAR PRESSURE WIRE/FFR STUDY;  Surgeon: Nelva Bush, MD;  Location: [place] [facility];  Service: Cardiovascular;  Laterality: N/A;   INTRAVASCULAR ULTRASOUND/IVUS N/A 04/12/2021   Procedure: Intravascular Ultrasound/IVUS;  Surgeon: Nelva Bush, MD;  Location: [place] [facility];  Service: Cardiovascular;  Laterality: N/A;   LEFT HEART CATH AND CORONARY ANGIOGRAPHY N/A 10/04/2020   Procedure: LEFT HEART CATH AND CORONARY ANGIOGRAPHY;  Surgeon: Nelva Bush, MD;  Location: [place] [facility];  Service: Cardiovascular;  Laterality: N/A;   RIGHT/LEFT HEART CATH AND CORONARY ANGIOGRAPHY N/A 04/12/2021   Procedure: RIGHT/LEFT HEART CATH AND CORONARY ANGIOGRAPHY;  Surgeon: Nelva Bush, MD;  Location: [place] [facility];  Service: Cardiovascular;  Laterality: N/A;   Patient Active Problem List   Diagnosis Date Noted   Anemia    Leukocytosis    Right arm weakness 01/25/2022   Right sided weakness    Fall at home 01/22/2022   Status post biventricular pacemaker - MRI compatible PPM and leads 01/22/2022   Chronic indwelling Foley catheter 01/22/2022   CKD (chronic kidney disease), stage III - IV ([place]) 06/28/2021   Chronic HFrEF (heart failure with reduced ejection fraction) ([place]) 03/31/2021   Arrhythmia    DM (diabetes mellitus), type 2 ([place]) new 10/26/2020   Urinary retention 10/26/2020   Hyperlipidemia LDL goal <70 10/26/2020   Coronary artery disease of native artery of native heart with stable angina pectoris ([place]) 10/26/2020   S/P angioplasty with stent 10/04/20 DES to LCX  10/26/2020   Ischemic cardiomyopathy 10/26/2020   Diabetes mellitus, type 2 ([place]) 10/06/2020   DOE (dyspnea on exertion)    Meningioma ([place]) 08/14/2020   Vestibular schwannoma ([place]) 08/14/2020   Cardiac arrest with ventricular fibrillation (HCC)    Pain of left hip joint 05/29/2020   Benign brain tumor ([place])    Chronic kidney disease    History of pulmonary embolus (PE)    Hypertension     Nonsustained ventricular tachycardia ([place]) 10/11/2019   Dizziness 10/11/2019   Abnormal echocardiogram 06/07/2019   Pulmonary hypertension due to thromboembolism ([place]) 03/09/2018   Solitary pulmonary nodule on lung CT 03/08/2018   Ascending aortic aneurysm (HCC) 4.2 cm based on CT from December 2020 01/29/2018   Sinus bradycardia 12/22/2017   Left bundle branch block 12/19/2017   Personal history of pulmonary embolism - no longer on anticoagulation due to pt refusal to take it anymore. 12/19/2017   Chronic kidney disease, stage III (moderate) ([place]) 12/19/2017   Benign neoplasm of brain ([place]) 12/19/2017    PCP: Cari Caraway, MD   REFERRING PROVIDER: Cari Caraway, MD   REFERRING DIAG: Falls, unsteady gait, mm weakness Rationale for Evaluation and Treatment Rehabilitation  THERAPY DIAG:  Unsteadiness on feet  Right sided weakness  Meningioma (HCC)  Muscle weakness (generalized)  Repeated falls  Other lack of coordination  ONSET DATE: 05/10/2022   SUBJECTIVE:                                                                                                                                                                                           SUBJECTIVE STATEMENT: Patient reports no new issues.  PERTINENT HISTORY:  Brief HPI:   Donshay Morita is a 77 y.o. male who fell backward striking his head and right neck shoulder area on 01/21/2022. He complained of right hemiparesis. CT head revealed left parafalcine meningioma.Started on Decadron with improvement in symptoms   PAIN:  Are you having pain? no PRECAUTIONS: ICD/Pacemaker  WEIGHT BEARING RESTRICTIONS No  FALLS:  Has patient fallen in last 6 months? Yes. Number of falls 2  LIVING ENVIRONMENT: Lives with: lives with their spouse Lives in: House/apartment Stairs: Yes: External: 7 steps; bilateral but cannot reach both Has following equipment at home: Walker - 2 wheeled and a cane, shower seat  OCCUPATION:  Retired  PLOF: Independent  PATIENT GOALS Feel normal with standing and functioning.  Feel stronger and have better balance   OBJECTIVE:   DIAGNOSTIC FINDINGS:  CT Lumbar- 3. Moderate degenerative spinal stenosis at L2-3. CT CERVICAL IMPRESSION: 1. No evidence of intracranial injury. 2. Negative for cervical spine fracture but there is prevertebral edema at C4-5 suggesting soft tissue injury. 3. Meningiomas with left cerebral of edema, reference brain MRI 01/22/2022.  SCREENING FOR RED FLAGS: Bowel or bladder incontinence: No Spinal tumors: No Cauda equina syndrome: No Compression fracture: No Abdominal aneurysm: Yes: Abdominal aortic aneurysm, stable  COGNITION:  Overall cognitive status: WFL     SENSATION: WFL Patient reports tingling in B hands, all over.  MUSCLE LENGTH: Hamstrings: approximately 55 degrees B.  Was 70 degrees in September 2023  POSTURE: rounded shoulders and forward head  PALPATION: No TTP in R shoulder or up traps. B up traps tight, low back TTP R and L proximal to Illiac crest  LUMBAR ROM:  AROM limited in all directions, but he lost his balance frequently, so accuracy of testing limited.   LOWER EXTREMITY ROM:      General mild stiffness throughout BLE with PROM.  LOWER EXTREMITY MMT:   MMT Right eval Left eval  Hip flexion 4- 4-  Hip extension    Hip abduction 4 4  Hip adduction    Hip internal rotation    Hip external rotation    Knee flexion 4+ 4-  Knee extension 4+ 4-  Ankle dorsiflexion 4 4  Ankle plantarflexion    Ankle inversion    Ankle eversion     (  Blank rows = not tested)  LUMBAR SPECIAL TESTS:  Slump test: Positive R  Shoulder Special Tests empty can test (+) R, Hawkins-Kennedy test (+) R. Previous MRI reveals full tear of supraspinatus and partial tear of infraspinatus.  FUNCTIONAL TESTS:  5 times sit to stand: 30 seconds struggled with weak knees Timed up and go (TUG): 27 seconds with SPC   TUG 10/12/22 = 13  seconds without device  Functional gait assessment: not tested BERG balance test:  23/56, 10/12/22 =29/56  GAIT: Distance walked: 80 Assistive device utilized: SPC, very unsteady used walls to stabilize, knees very shaky and unsteady, needing CGA to min A Level of assistance: CGA to min A for LOB Comments: Patient was unsteady with turns, relied on UE support for balance., legs shaky, presents like he has neuropathy but he does not, also reports double vision  TODAY'S TREATMENT  12/07/22 NuStep L4 x 6 minutes B side stepping on air ex plank. Cued to slow down, very challenging with frequent grasp of the rail. 10 reps each way. Standing on air ex pad- lat and forward and back weight shifts x 10 each, unsteady, but no LOB. Air ex pad while holding 4# ball, chest press x 10, then diagonals x 10 in each direction. Unsteady, with multiple LOB requiring UE support Repeated activity with one eye closed to eliminate double vision. improved balance and steadiness noted. B side step up onto and off Air ex pad, 12 times each direction. Mild- mod unsteadiness. Standing shoulder ext, rows with 15#, 2 x 10, ER 5#, 2 x 10 each Seated hip ext against Black Tband resistance, 2 x 10 reps Ambulation holding 7# weight in one hand-80' in R/80' in L hand, no unsteadiness.  12/01/22 Bike L3 x 5 min, then 3 x 30 sec fast pedal with rest breaks. Seated chin tucks with ball on wall. B shoulder ext, rows, ER 2 x 10 with G tband Cervical rotation with chin tuck holding ball on wall. Cervical flexion with ball on wall, 10 each Standing hip abd against physioball on wall, 10 reps each side Mini Squats, reaching to tap 5# weight on target 17" off floor, 2 x 10, TC for appropriate posture.  11/24/22 NuStep L5 x 6 minutes Standing with toes on slightly ramped surface at end of parallel bars. First static, then performed ant/post weight shifts, no UE support. Demonstrated good balance and control, with occasional  unsteadiness backwards, but recovered with CGA. Sit to stand from elevated mat with toes on elevated slightly. 3 x 5 reps. Unable to lower mat due to increased knee pain when lower. Standing on Airex pad, alternate marching, with bars for safety. He initially could not lift either foot off the pad, but progressed to slow marching with occasional LOB, mild. Attempted quick side to side step, but he was too unstable and unsafe. Bike-3 x 30 sec fast pedal, > 80 RPM  11/22/22 NuStep L4 x 6 minutes. No problems/pain in knee, using BUE and LE. Seated B knee flexion 35#, 3 x 10 reps Seated knee ext 10#, 3 x 10 reps-minimal L knee pain at initiation. L knee assessment- MCL/LCL intact, McMurray's test (-) TTP lateral femoral epicondyle. Standing ITB stretch, caused L hip pain from his previous fall 6 months ago Standing hip abd against G tband at ankles, 2 x 10 each, Mod TC and VC for form. Alternating step taps without UE support, CGA, unsteady, but no LOB Multiple taps with each foot required occasional min a due  to LOB Lateral step taps, CGA.  11/17/22 NuStep L5 x 6 minutes Standing in parallel bars- crossover step with weight shift and back, R/L, cross behind and back, and SLS. He required UE support for each exercise. Did show minimal improvement in stability with repetition of each activity. Step ups- with 2# weights on each ankle- 10 forward on 6" step, 10 side step on 4" step, all with U or BUE support. Too unsteady to try crossover step. B side stepping against G tband resistance, 6 x 4 steps each way. Moderately unsteady with multiple episodes of LOB. Seated knee flexion BLE, 25#, 2 x 15 reps Seated knee ext against 10#, 2 x 15 reps Leg press, 20#, 2 x 10 reps.  \11/10/22 Nustep level 5 x 6 minutes Gait out the back door, curb, around the small parking island and then to the front door with gait belt and CGA Side stepping on the airex balance beam 2.5# march, hip abduction and  extension Cone toe touches on and off airex with two SPC's Side stepping ball toss On airex head turns and volleyball in a corner On airex butt touches with back to wall  11/08/22 Nustep level 5 x 6 minutes Direction changes Walking figure 8's on the mat Walking across the mat without changing speed Direction changes In pbars 2.5# hip march, abduction and extension Side stepping on the airex balance beam On airex back to wall bottom touches and shoulder blade touches to the wall for balance and hip strategies On airex head turns Holding cane 12" toe touches and then cone toe touches volleyball  20# farmer carry Side stepping over 3 sticks   PATIENT EDUCATION:  Education details: POC,  Person educated: Patient and Spouse Education method: Explanation Education comprehension: verbalized understanding   HOME EXERCISE PROGRAM: Access Code: [email or web address] URL: [email or web address] Date: 10/12/2022 Prepared by: Lum Babe  Exercises - Seated Isometric Cervical Sidebending  - 1 x daily - 7 x weekly - 2 sets - 10 reps - 3 hold - Seated Isometric Cervical Rotation  - 1 x daily - 7 x weekly - 2 sets - 10 reps - 3 hold  [email or web address]   ASSESSMENT:  CLINICAL IMPRESSION: Patient reports no changes. His R shoulder feels better. Treatment focused on balance on uneven and soft surfaces. His stability improved when he closed one eye due to eliminating double vision. Eye appointment 12/19/22.  OBJECTIVE IMPAIRMENTS Abnormal gait, decreased activity tolerance, decreased balance, decreased cognition, decreased coordination, difficulty walking, decreased ROM, decreased strength, increased muscle spasms, impaired flexibility, postural dysfunction, and pain.   ACTIVITY LIMITATIONS carrying, lifting, bending, standing, squatting, stairs, reach over head, and locomotion level  PARTICIPATION LIMITATIONS: cleaning, laundry, driving, shopping, and community activity  PERSONAL FACTORS  Age and 1 comorbidity: multiple meningiomas  are also affecting patient's functional outcome.   REHAB POTENTIAL: Good  CLINICAL DECISION MAKING: Evolving/moderate complexity  EVALUATION COMPLEXITY: Moderate   GOALS: Goals reviewed with patient? Yes  SHORT TERM GOALS: Target date: 10/04/22  I with initial HEP Baseline: Goal status: 10/21/22-met  LONG TERM GOALS: Target date: 12/20/22  I with final HEP Baseline:  Goal status: ongoing 11/10/22  2.  Patient will have no falls over a 6 week period Baseline: 2 falls  Goal status:met 11/08/22  3.  Complete 5 x STS in <12 sec to demonstrate improved LE strength Baseline: 30 seconds Goal status: ongoing  4.  Patient will complete TUG in < 12 sec with or without AD Baseline: 27 seconds Goal status:  progressing  5.  Patient will score at least 43/56 on Berg balance Baseline: 23/56 Goal status: 12/07/22 progressing  6.  Patient will walk at least 600' with LRAD, MI on level an unlevel surfaces, back pain < 3/10 Baseline: 80', unsteady, SPC with CGA/Min A Goal status: 12/07/22 progressing   PLAN: PT FREQUENCY: 2x/week  PT DURATION: 12 weeks  PLANNED INTERVENTIONS: Therapeutic exercises, Therapeutic activity, Neuromuscular re-education, Balance training, Gait training, Patient/Family education, Self Care, Joint mobilization, Stair training, Dry Needling, Electrical stimulation, Cryotherapy, Moist heat, Ultrasound, Ionotophoresis 4mg /ml Dexamethasone, and Manual therapy.  PLAN FOR NEXT SESSION:   continue with the balance  Ethel Rana DPT 12/07/22 10:57 AM

## 2022-12-12 DIAGNOSIS — I493 Ventricular premature depolarization: Secondary | ICD-10-CM | POA: Diagnosis not present

## 2022-12-12 DIAGNOSIS — Z0000 Encounter for general adult medical examination without abnormal findings: Secondary | ICD-10-CM | POA: Diagnosis not present

## 2022-12-12 DIAGNOSIS — I255 Ischemic cardiomyopathy: Secondary | ICD-10-CM | POA: Diagnosis not present

## 2022-12-12 DIAGNOSIS — Z950 Presence of cardiac pacemaker: Secondary | ICD-10-CM | POA: Diagnosis not present

## 2022-12-12 DIAGNOSIS — Z8669 Personal history of other diseases of the nervous system and sense organs: Secondary | ICD-10-CM | POA: Diagnosis not present

## 2022-12-12 DIAGNOSIS — I25118 Atherosclerotic heart disease of native coronary artery with other forms of angina pectoris: Secondary | ICD-10-CM | POA: Diagnosis not present

## 2022-12-12 DIAGNOSIS — I5022 Chronic systolic (congestive) heart failure: Secondary | ICD-10-CM | POA: Diagnosis not present

## 2022-12-12 DIAGNOSIS — Z8639 Personal history of other endocrine, nutritional and metabolic disease: Secondary | ICD-10-CM | POA: Diagnosis not present

## 2022-12-12 DIAGNOSIS — I7121 Aneurysm of the ascending aorta, without rupture: Secondary | ICD-10-CM | POA: Diagnosis not present

## 2022-12-12 DIAGNOSIS — Z1331 Encounter for screening for depression: Secondary | ICD-10-CM | POA: Diagnosis not present

## 2022-12-12 DIAGNOSIS — Z8674 Personal history of sudden cardiac arrest: Secondary | ICD-10-CM | POA: Diagnosis not present

## 2022-12-12 DIAGNOSIS — I10 Essential (primary) hypertension: Secondary | ICD-10-CM | POA: Diagnosis not present

## 2022-12-13 ENCOUNTER — Encounter: Payer: Self-pay | Admitting: Physical Therapy

## 2022-12-13 ENCOUNTER — Ambulatory Visit: Payer: Medicare Other | Attending: Family Medicine | Admitting: Physical Therapy

## 2022-12-13 DIAGNOSIS — R278 Other lack of coordination: Secondary | ICD-10-CM

## 2022-12-13 DIAGNOSIS — R296 Repeated falls: Secondary | ICD-10-CM | POA: Diagnosis not present

## 2022-12-13 DIAGNOSIS — R2681 Unsteadiness on feet: Secondary | ICD-10-CM | POA: Diagnosis not present

## 2022-12-13 DIAGNOSIS — R531 Weakness: Secondary | ICD-10-CM | POA: Diagnosis not present

## 2022-12-13 DIAGNOSIS — M6281 Muscle weakness (generalized): Secondary | ICD-10-CM

## 2022-12-13 DIAGNOSIS — D329 Benign neoplasm of meninges, unspecified: Secondary | ICD-10-CM | POA: Diagnosis not present

## 2022-12-13 NOTE — Therapy (Signed)
OUTPATIENT PHYSICAL THERAPY THORACOLUMBAR TREATMENT   Patient Name: Timothy Moran MRN: [id number] DOB:08-07-1946, 77 y.o., male Today's Date: 12/13/2022   PT End of Session - 12/13/22 1059     Visit Number 20    Date for PT Re-Evaluation 12/20/22    PT Start Time 1055    PT Stop Time 1135    PT Time Calculation (min) 40 min    Equipment Utilized During Treatment Gait belt    Activity Tolerance Patient tolerated treatment well    Behavior During Therapy [facility] for tasks assessed/performed           Progress Note Reporting Period 10/27/22 to 12/13/22  See note below for Objective Data and Assessment of Progress/Goals.     Past Medical History:  Diagnosis Date   Acute blood loss anemia 10/05/2020   Acute respiratory failure ([place]), hypothermia therapy, vent - extubated 10/06/20    AKI (acute kidney injury) 10/05/2020   Anoxic brain injury 10/26/2020   Arrhythmia    Ascending aortic aneurysm 01/29/2018   a. 2019 4.3 cm by echo; b. 02/2021 Echo: Ao root 8mm.   Benign brain tumor    Benign neoplasm of brain 12/19/2017   Cardiac arrest with ventricular fibrillation    Chest pain 12/04/2017   Chronic kidney disease, stage III (moderate) 12/19/2017   CKD (chronic kidney disease), stage III - IV (HCC)    Coronary Artery Disease    a. 0000000 Inf STEMI complicated by cardiac arrest/PCI: LAD 50p/m, LCX 100d (2.5 x 30 Resolute Onyx DES); b. 04/2021 PCI: 04/2021 LM nl, LAD 50p/4m (2.5x30 Onyx Frontier DES), LCX 25p, 53m (RFR 0.98), 40d ISR (RFR 0.98), OM2 25, RCA mild diff dzs.   Diabetes mellitus, type 2 10/06/2020   10/06/20 A1C 7%   Dizziness 10/11/2019   Encounter for imaging study to confirm orogastric (OG) tube placement    Hematuria 10/26/2020   HFrEF (heart failure with reduced ejection fraction)    a. 11/2020 Echo: EF 25-30%; b. 02/2021 Echo: EF 25-30%, glob HK. Mild LVH. Nl RV fxn. Triv AI. Ao root 69mm.   History of pulmonary embolus (PE)    HLD (hyperlipidemia) 10/26/2020    Hypertension    Iron deficiency anemia rec'd IV iron 10/26/2020   Ischemic cardiomyopathy    a. 11/2020 Echo: EF 25-30%; b. 02/2021 Echo: EF 25-30%, glob HK.   Left bundle branch block 12/19/2017   Meningioma 08/14/2020   Nonsustained ventricular tachycardia 10/11/2019   Pain of left hip joint 05/29/2020   Personal history of pulmonary embolism 12/19/2017   Pneumonia of both lungs due to infectious organism    Pulmonary hypertension due to thromboembolism 03/09/2018   See echo 12/27/17 with nl PAS vs CTa chest 02/23/18    S/P angioplasty with stent 10/04/20 DES to LCX  10/26/2020   Sinus bradycardia 12/22/2017   SOB (shortness of breath)    Solitary pulmonary nodule on lung CT 03/08/2018   CT 12/04/17 1.0 x 0.8 x 0.7 cm nodular opacity in the RUL vs not seen 03/16/10 posterior segment of the right upper lobe. Marland KitchenSpirometry 03/08/2018    FEV1 3.86 (108%)  Ratio 96 s prior rx  - PET  03/13/18   Low grade c/w adenoca > rec T surgery eval     STEMI (ST elevation myocardial infarction) 10/04/2020   Urinary retention 10/26/2020   Vestibular schwannoma 08/14/2020   Past Surgical History:  Procedure Laterality Date   APPENDECTOMY     BIV ICD INSERTION CRT-D N/A 11/03/2021  Procedure: BIV ICD INSERTION CRT-D;  Surgeon: Constance Haw, MD;  Location: [facility];  Service: Cardiovascular;  Laterality: N/A;   CARDIAC CATHETERIZATION     CORONARY ANGIOPLASTY WITH STENT PLACEMENT Left 04/12/2021   LAD stent placement   CORONARY STENT INTERVENTION N/A 04/12/2021   Procedure: CORONARY STENT INTERVENTION;  Surgeon: Nelva Bush, MD;  Location: [place] [facility];  Service: Cardiovascular;  Laterality: N/A;   CORONARY ULTRASOUND/IVUS N/A 04/12/2021   Procedure: Intravascular Ultrasound/IVUS;  Surgeon: Nelva Bush, MD;  Location: [place] [facility];  Service: Cardiovascular;  Laterality: N/A;   CORONARY/GRAFT ACUTE MI REVASCULARIZATION N/A 10/04/2020   Procedure: Coronary/Graft Acute MI  Revascularization;  Surgeon: Nelva Bush, MD;  Location: [place] [facility];  Service: Cardiovascular;  Laterality: N/A;   HERNIA REPAIR     INTRAVASCULAR PRESSURE WIRE/FFR STUDY N/A 04/12/2021   Procedure: INTRAVASCULAR PRESSURE WIRE/FFR STUDY;  Surgeon: Nelva Bush, MD;  Location: [place] [facility];  Service: Cardiovascular;  Laterality: N/A;   LEFT HEART CATH AND CORONARY ANGIOGRAPHY N/A 10/04/2020   Procedure: LEFT HEART CATH AND CORONARY ANGIOGRAPHY;  Surgeon: Nelva Bush, MD;  Location: [place] [facility];  Service: Cardiovascular;  Laterality: N/A;   RIGHT/LEFT HEART CATH AND CORONARY ANGIOGRAPHY N/A 04/12/2021   Procedure: RIGHT/LEFT HEART CATH AND CORONARY ANGIOGRAPHY;  Surgeon: Nelva Bush, MD;  Location: [place] [facility];  Service: Cardiovascular;  Laterality: N/A;   Patient Active Problem List   Diagnosis Date Noted   Anemia    Leukocytosis    Right arm weakness 01/25/2022   Right sided weakness    Fall at home 01/22/2022   Status post biventricular pacemaker - MRI compatible PPM and leads 01/22/2022   Chronic indwelling Foley catheter 01/22/2022   CKD (chronic kidney disease), stage III - IV ([place]) 06/28/2021   Chronic HFrEF (heart failure with reduced ejection fraction) 03/31/2021   Arrhythmia    DM (diabetes mellitus), type 2 ([place]) new 10/26/2020   Urinary retention 10/26/2020   Hyperlipidemia LDL goal <70 10/26/2020   Coronary artery disease of native artery of native heart with stable angina pectoris 10/26/2020   S/P angioplasty with stent 10/04/20 DES to LCX  10/26/2020   Ischemic cardiomyopathy 10/26/2020   Diabetes mellitus, type 2 10/06/2020   DOE (dyspnea on exertion)    Meningioma 08/14/2020   Vestibular schwannoma 08/14/2020   Cardiac arrest with ventricular fibrillation    Pain of left hip joint 05/29/2020   Benign brain tumor    Chronic kidney disease    History of pulmonary embolus (PE)    Hypertension    Nonsustained ventricular  tachycardia 10/11/2019   Dizziness 10/11/2019   Abnormal echocardiogram 06/07/2019   Pulmonary hypertension due to thromboembolism 03/09/2018   Solitary pulmonary nodule on lung CT 03/08/2018   Ascending aortic aneurysm (HCC) 4.2 cm based on CT from December 2020 01/29/2018   Sinus bradycardia 12/22/2017   Left bundle branch block 12/19/2017   Personal history of pulmonary embolism - no longer on anticoagulation due to pt refusal to take it anymore. 12/19/2017   Chronic kidney disease, stage III (moderate) 12/19/2017   Benign neoplasm of brain 12/19/2017    PCP: Cari Caraway, MD   REFERRING PROVIDER: Cari Caraway, MD   REFERRING DIAG: Falls, unsteady gait, mm weakness Rationale for Evaluation and Treatment Rehabilitation  THERAPY DIAG:  Unsteadiness on feet  Right sided weakness  Meningioma  Repeated falls  Other lack of coordination  Muscle weakness (generalized)  ONSET DATE:  05/10/2022   SUBJECTIVE:                                                                                                                                                                                           SUBJECTIVE STATEMENT: Patient reports no new issues.  PERTINENT HISTORY:  Brief HPI:   Timothy Moran is a 77 y.o. male who fell backward striking his head and right neck shoulder area on 01/21/2022. He complained of right hemiparesis. CT head revealed left parafalcine meningioma.Started on Decadron with improvement in symptoms   PAIN:  Are you having pain? no PRECAUTIONS: ICD/Pacemaker  WEIGHT BEARING RESTRICTIONS No  FALLS:  Has patient fallen in last 6 months? Yes. Number of falls 2  LIVING ENVIRONMENT: Lives with: lives with their spouse Lives in: House/apartment Stairs: Yes: External: 7 steps; bilateral but cannot reach both Has following equipment at home: Walker - 2 wheeled and a cane, shower seat  OCCUPATION: Retired  PLOF: Independent  PATIENT GOALS Feel  normal with standing and functioning.  Feel stronger and have better balance   OBJECTIVE:   DIAGNOSTIC FINDINGS:  CT Lumbar- 3. Moderate degenerative spinal stenosis at L2-3. CT CERVICAL IMPRESSION: 1. No evidence of intracranial injury. 2. Negative for cervical spine fracture but there is prevertebral edema at C4-5 suggesting soft tissue injury. 3. Meningiomas with left cerebral of edema, reference brain MRI 01/22/2022.  SCREENING FOR RED FLAGS: Bowel or bladder incontinence: No Spinal tumors: No Cauda equina syndrome: No Compression fracture: No Abdominal aneurysm: Yes: Abdominal aortic aneurysm, stable  COGNITION:  Overall cognitive status: WFL     SENSATION: WFL Patient reports tingling in B hands, all over.  MUSCLE LENGTH: Hamstrings: approximately 55 degrees B.  Was 70 degrees in September 2023  POSTURE: rounded shoulders and forward head  PALPATION: No TTP in R shoulder or up traps. B up traps tight, low back TTP R and L proximal to Illiac crest  LUMBAR ROM:  AROM limited in all directions, but he lost his balance frequently, so accuracy of testing limited.   LOWER EXTREMITY ROM:      General mild stiffness throughout BLE with PROM.  LOWER EXTREMITY MMT:   MMT Right eval Left eval  Hip flexion 4- 4-  Hip extension    Hip abduction 4 4  Hip adduction    Hip internal rotation    Hip external rotation    Knee flexion 4+ 4-  Knee extension 4+ 4-  Ankle dorsiflexion 4 4  Ankle plantarflexion    Ankle inversion    Ankle eversion     (Blank rows = not tested)  LUMBAR SPECIAL  TESTS:  Slump test: Positive R  Shoulder Special Tests empty can test (+) R, Hawkins-Kennedy test (+) R. Previous MRI reveals full tear of supraspinatus and partial tear of infraspinatus.  FUNCTIONAL TESTS:  5 times sit to stand: 30 seconds struggled with weak knees Timed up and go (TUG): 27 seconds with SPC   TUG 10/12/22 = 13 seconds without device  Functional gait  assessment: not tested BERG balance test:  23/56, 10/12/22 =29/56  GAIT: Distance walked: 80 Assistive device utilized: SPC, very unsteady used walls to stabilize, knees very shaky and unsteady, needing CGA to min A Level of assistance: CGA to min A for LOB Comments: Patient was unsteady with turns, relied on UE support for balance., legs shaky, presents like he has neuropathy but he does not, also reports double vision  TODAY'S TREATMENT  12/13/22 NuStep L5 x 6 minutes Functional re-assessment for PN-see goals Mini squats with hands on the back of a chair for steadying Seated rotation. R hand on mat, rotate to R to place hand on the mat, then reach back to the L. Seated sliding with rotation. Place both hands on mat to R, slowly slide hands out and back. Only go as far as possible without raising pain to 4 or >. Supine marching bridge reviewed and performed 3 x 10 reps.  12/07/22 NuStep L4 x 6 minutes B side stepping on air ex plank. Cued to slow down, very challenging with frequent grasp of the rail. 10 reps each way. Standing on air ex pad- lat and forward and back weight shifts x 10 each, unsteady, but no LOB. Air ex pad while holding 4# ball, chest press x 10, then diagonals x 10 in each direction. Unsteady, with multiple LOB requiring UE support Repeated activity with one eye closed to eliminate double vision. improved balance and steadiness noted. B side step up onto and off Air ex pad, 12 times each direction. Mild- mod unsteadiness. Standing shoulder ext, rows with 15#, 2 x 10, ER 5#, 2 x 10 each Seated hip ext against Black Tband resistance, 2 x 10 reps Ambulation holding 7# weight in one hand-80' in R/80' in L hand, no unsteadiness.  12/01/22 Bike L3 x 5 min, then 3 x 30 sec fast pedal with rest breaks. Seated chin tucks with ball on wall. B shoulder ext, rows, ER 2 x 10 with G tband Cervical rotation with chin tuck holding ball on wall. Cervical flexion with ball on wall, 10  each Standing hip abd against physioball on wall, 10 reps each side Mini Squats, reaching to tap 5# weight on target 17" off floor, 2 x 10, TC for appropriate posture.  11/24/22 NuStep L5 x 6 minutes Standing with toes on slightly ramped surface at end of parallel bars. First static, then performed ant/post weight shifts, no UE support. Demonstrated good balance and control, with occasional unsteadiness backwards, but recovered with CGA. Sit to stand from elevated mat with toes on elevated slightly. 3 x 5 reps. Unable to lower mat due to increased knee pain when lower. Standing on Airex pad, alternate marching, with bars for safety. He initially could not lift either foot off the pad, but progressed to slow marching with occasional LOB, mild. Attempted quick side to side step, but he was too unstable and unsafe. Bike-3 x 30 sec fast pedal, > 80 RPM  11/22/22 NuStep L4 x 6 minutes. No problems/pain in knee, using BUE and LE. Seated B knee flexion 35#, 3 x 10 reps Seated  knee ext 10#, 3 x 10 reps-minimal L knee pain at initiation. L knee assessment- MCL/LCL intact, McMurray's test (-) TTP lateral femoral epicondyle. Standing ITB stretch, caused L hip pain from his previous fall 6 months ago Standing hip abd against G tband at ankles, 2 x 10 each, Mod TC and VC for form. Alternating step taps without UE support, CGA, unsteady, but no LOB Multiple taps with each foot required occasional min a due to LOB Lateral step taps, CGA.  11/17/22 NuStep L5 x 6 minutes Standing in parallel bars- crossover step with weight shift and back, R/L, cross behind and back, and SLS. He required UE support for each exercise. Did show minimal improvement in stability with repetition of each activity. Step ups- with 2# weights on each ankle- 10 forward on 6" step, 10 side step on 4" step, all with U or BUE support. Too unsteady to try crossover step. B side stepping against G tband resistance, 6 x 4 steps each way.  Moderately unsteady with multiple episodes of LOB. Seated knee flexion BLE, 25#, 2 x 15 reps Seated knee ext against 10#, 2 x 15 reps Leg press, 20#, 2 x 10 reps.  \11/10/22 Nustep level 5 x 6 minutes Gait out the back door, curb, around the small parking island and then to the front door with gait belt and CGA Side stepping on the airex balance beam 2.5# march, hip abduction and extension Cone toe touches on and off airex with two SPC's Side stepping ball toss On airex head turns and volleyball in a corner On airex butt touches with back to wall  11/08/22 Nustep level 5 x 6 minutes Direction changes Walking figure 8's on the mat Walking across the mat without changing speed Direction changes In pbars 2.5# hip march, abduction and extension Side stepping on the airex balance beam On airex back to wall bottom touches and shoulder blade touches to the wall for balance and hip strategies On airex head turns Holding cane 12" toe touches and then cone toe touches volleyball  20# farmer carry Side stepping over 3 sticks   PATIENT EDUCATION:  Education details: POC,  Person educated: Patient and Spouse Education method: Explanation Education comprehension: verbalized understanding   HOME EXERCISE PROGRAM: Access Code: [email or web address] URL: [email or web address] Date: 10/12/2022 Prepared by: Lum Babe  Exercises - Seated Isometric Cervical Sidebending  - 1 x daily - 7 x weekly - 2 sets - 10 reps - 3 hold - Seated Isometric Cervical Rotation  - 1 x daily - 7 x weekly - 2 sets - 10 reps - 3 hold  [email or web address]   ASSESSMENT:  CLINICAL IMPRESSION: Patient reports no changes. He still has pain in his L lower back at times, especially with rotation. Re-assessed for PN. He has progressed with balance, has not met goals. Has one more treatment scheduled before undergoing eye surgery.  OBJECTIVE IMPAIRMENTS Abnormal gait, decreased activity tolerance, decreased balance,  decreased cognition, decreased coordination, difficulty walking, decreased ROM, decreased strength, increased muscle spasms, impaired flexibility, postural dysfunction, and pain.   ACTIVITY LIMITATIONS carrying, lifting, bending, standing, squatting, stairs, reach over head, and locomotion level  PARTICIPATION LIMITATIONS: cleaning, laundry, driving, shopping, and community activity  PERSONAL FACTORS Age and 1 comorbidity: multiple meningiomas  are also affecting patient's functional outcome.   REHAB POTENTIAL: Good  CLINICAL DECISION MAKING: Evolving/moderate complexity  EVALUATION COMPLEXITY: Moderate   GOALS: Goals reviewed with patient? Yes  SHORT TERM GOALS: Target date: 10/04/22  I with initial HEP Baseline:  Goal status: 10/21/22-met  LONG TERM GOALS: Target date: 12/20/22  I with final HEP Baseline:  Goal status: ongoing 12/13/22  2.  Patient will have no falls over a 6 week period Baseline: 2 falls  Goal status:met 11/08/22  3.  Complete 5 x STS in <12 sec to demonstrate improved LE strength Baseline: 30 seconds Goal status: 12/13/22-13 seconds-ongoing  4.  Patient will complete TUG in < 12 sec with or without AD Baseline: 27 seconds Goal status: 12/13/22-11.3, met  5.  Patient will score at least 43/56 on Berg balance Baseline: 23/56 Goal status: 12/07/22 progressing  6.  Patient will walk at least 600' with LRAD, MI on level an unlevel surfaces, back pain < 3/10 Baseline: 80', unsteady, SPC with CGA/Min A Goal status: 12/07/22 progressing   PLAN: PT FREQUENCY: 2x/week  PT DURATION: 12 weeks  PLANNED INTERVENTIONS: Therapeutic exercises, Therapeutic activity, Neuromuscular re-education, Balance training, Gait training, Patient/Family education, Self Care, Joint mobilization, Stair training, Dry Needling, Electrical stimulation, Cryotherapy, Moist heat, Ultrasound, Ionotophoresis 4mg /ml Dexamethasone, and Manual therapy.  PLAN FOR NEXT SESSION:   Re-assess for  D/C. Ethel Rana DPT 12/13/22 11:43 AM

## 2022-12-14 NOTE — Progress Notes (Signed)
Remote ICD transmission.   

## 2022-12-15 ENCOUNTER — Other Ambulatory Visit: Payer: Self-pay | Admitting: Cardiology

## 2022-12-15 ENCOUNTER — Ambulatory Visit: Payer: Medicare Other | Admitting: Physical Therapy

## 2022-12-15 ENCOUNTER — Encounter: Payer: Self-pay | Admitting: Physical Therapy

## 2022-12-15 DIAGNOSIS — D329 Benign neoplasm of meninges, unspecified: Secondary | ICD-10-CM

## 2022-12-15 DIAGNOSIS — M6281 Muscle weakness (generalized): Secondary | ICD-10-CM

## 2022-12-15 DIAGNOSIS — R278 Other lack of coordination: Secondary | ICD-10-CM

## 2022-12-15 DIAGNOSIS — R2681 Unsteadiness on feet: Secondary | ICD-10-CM | POA: Diagnosis not present

## 2022-12-15 DIAGNOSIS — R296 Repeated falls: Secondary | ICD-10-CM | POA: Diagnosis not present

## 2022-12-15 DIAGNOSIS — R531 Weakness: Secondary | ICD-10-CM

## 2022-12-15 NOTE — Therapy (Signed)
OUTPATIENT PHYSICAL THERAPY THORACOLUMBAR TREATMENT  PHYSICAL THERAPY DISCHARGE SUMMARY  Visits from Start of Care: 21  Current functional level related to goals / functional outcomes: Remains shaky and imbalanced   Remaining deficits: Shaky, unsteady   Education / Equipment: HEP   Patient agrees to discharge. Patient goals were partially met. Patient is being discharged due to maximized rehab potential.    Patient Name: Timothy Moran MRN: [id number] DOB:1946/01/30, 77 y.o., male Today's Date: 12/15/2022   PT End of Session - 12/15/22 1059     Visit Number 21    Date for PT Re-Evaluation 12/20/22    PT Start Time 1055    PT Stop Time 1135    PT Time Calculation (min) 40 min    Equipment Utilized During Treatment Gait belt    Activity Tolerance Patient tolerated treatment well    Behavior During Therapy [facility] for tasks assessed/performed            Progress Note Reporting Period 10/27/22 to 12/13/22  See note below for Objective Data and Assessment of Progress/Goals.     Past Medical History:  Diagnosis Date   Acute blood loss anemia 10/05/2020   Acute respiratory failure ([place]), hypothermia therapy, vent - extubated 10/06/20    AKI (acute kidney injury) 10/05/2020   Anoxic brain injury 10/26/2020   Arrhythmia    Ascending aortic aneurysm 01/29/2018   a. 2019 4.3 cm by echo; b. 02/2021 Echo: Ao root 35mm.   Benign brain tumor    Benign neoplasm of brain 12/19/2017   Cardiac arrest with ventricular fibrillation    Chest pain 12/04/2017   Chronic kidney disease, stage III (moderate) 12/19/2017   CKD (chronic kidney disease), stage III - IV (HCC)    Coronary Artery Disease    a. 0000000 Inf STEMI complicated by cardiac arrest/PCI: LAD 50p/m, LCX 100d (2.5 x 30 Resolute Onyx DES); b. 04/2021 PCI: 04/2021 LM nl, LAD 50p/19m (2.5x30 Onyx Frontier DES), LCX 25p, 17m (RFR 0.98), 40d ISR (RFR 0.98), OM2 25, RCA mild diff dzs.   Diabetes mellitus, type 2 10/06/2020   10/06/20  A1C 7%   Dizziness 10/11/2019   Encounter for imaging study to confirm orogastric (OG) tube placement    Hematuria 10/26/2020   HFrEF (heart failure with reduced ejection fraction)    a. 11/2020 Echo: EF 25-30%; b. 02/2021 Echo: EF 25-30%, glob HK. Mild LVH. Nl RV fxn. Triv AI. Ao root 18mm.   History of pulmonary embolus (PE)    HLD (hyperlipidemia) 10/26/2020   Hypertension    Iron deficiency anemia rec'd IV iron 10/26/2020   Ischemic cardiomyopathy    a. 11/2020 Echo: EF 25-30%; b. 02/2021 Echo: EF 25-30%, glob HK.   Left bundle branch block 12/19/2017   Meningioma 08/14/2020   Nonsustained ventricular tachycardia 10/11/2019   Pain of left hip joint 05/29/2020   Personal history of pulmonary embolism 12/19/2017   Pneumonia of both lungs due to infectious organism    Pulmonary hypertension due to thromboembolism 03/09/2018   See echo 12/27/17 with nl PAS vs CTa chest 02/23/18    S/P angioplasty with stent 10/04/20 DES to LCX  10/26/2020   Sinus bradycardia 12/22/2017   SOB (shortness of breath)    Solitary pulmonary nodule on lung CT 03/08/2018   CT 12/04/17 1.0 x 0.8 x 0.7 cm nodular opacity in the RUL vs not seen 03/16/10 posterior segment of the right upper lobe. Marland KitchenSpirometry 03/08/2018    FEV1 3.86 (108%)  Ratio 96 s prior  rx  - PET  03/13/18   Low grade c/w adenoca > rec T surgery eval     STEMI (ST elevation myocardial infarction) 10/04/2020   Urinary retention 10/26/2020   Vestibular schwannoma 08/14/2020   Past Surgical History:  Procedure Laterality Date   APPENDECTOMY     BIV ICD INSERTION CRT-D N/A 11/03/2021   Procedure: BIV ICD INSERTION CRT-D;  Surgeon: Constance Haw, MD;  Location: [place] [facility];  Service: Cardiovascular;  Laterality: N/A;   CARDIAC CATHETERIZATION     CORONARY ANGIOPLASTY WITH STENT PLACEMENT Left 04/12/2021   LAD stent placement   CORONARY PRESSURE/FFR STUDY N/A 04/12/2021   Procedure: INTRAVASCULAR PRESSURE WIRE/FFR STUDY;  Surgeon: Nelva Bush, MD;  Location: [place] [facility];  Service: Cardiovascular;  Laterality: N/A;   CORONARY STENT INTERVENTION N/A 04/12/2021   Procedure: CORONARY STENT INTERVENTION;  Surgeon: Nelva Bush, MD;  Location: [place] [facility];  Service: Cardiovascular;  Laterality: N/A;   CORONARY ULTRASOUND/IVUS N/A 04/12/2021   Procedure: Intravascular Ultrasound/IVUS;  Surgeon: Nelva Bush, MD;  Location: [place] [facility];  Service: Cardiovascular;  Laterality: N/A;   CORONARY/GRAFT ACUTE MI REVASCULARIZATION N/A 10/04/2020   Procedure: Coronary/Graft Acute MI Revascularization;  Surgeon: Nelva Bush, MD;  Location: [place] [facility];  Service: Cardiovascular;  Laterality: N/A;   HERNIA REPAIR     LEFT HEART CATH AND CORONARY ANGIOGRAPHY N/A 10/04/2020   Procedure: LEFT HEART CATH AND CORONARY ANGIOGRAPHY;  Surgeon: Nelva Bush, MD;  Location: [place] [facility];  Service: Cardiovascular;  Laterality: N/A;   RIGHT/LEFT HEART CATH AND CORONARY ANGIOGRAPHY N/A 04/12/2021   Procedure: RIGHT/LEFT HEART CATH AND CORONARY ANGIOGRAPHY;  Surgeon: Nelva Bush, MD;  Location: [place] [facility];  Service: Cardiovascular;  Laterality: N/A;   Patient Active Problem List   Diagnosis Date Noted   Anemia    Leukocytosis    Right arm weakness 01/25/2022   Right sided weakness    Fall at home 01/22/2022   Status post biventricular pacemaker - MRI compatible PPM and leads 01/22/2022   Chronic indwelling Foley catheter 01/22/2022   CKD (chronic kidney disease), stage III - IV ([place]) 06/28/2021   Chronic HFrEF (heart failure with reduced ejection fraction) 03/31/2021   Arrhythmia    DM (diabetes mellitus), type 2 ([place]) new 10/26/2020   Urinary retention 10/26/2020   Hyperlipidemia LDL goal <70 10/26/2020   Coronary artery disease of native artery of native heart with stable angina pectoris 10/26/2020   S/P angioplasty with stent 10/04/20 DES to LCX  10/26/2020   Ischemic cardiomyopathy  10/26/2020   Diabetes mellitus, type 2 10/06/2020   DOE (dyspnea on exertion)    Meningioma 08/14/2020   Vestibular schwannoma 08/14/2020   Cardiac arrest with ventricular fibrillation    Pain of left hip joint 05/29/2020   Benign brain tumor    Chronic kidney disease    History of pulmonary embolus (PE)    Hypertension    Nonsustained ventricular tachycardia 10/11/2019   Dizziness 10/11/2019   Abnormal echocardiogram 06/07/2019   Pulmonary hypertension due to thromboembolism 03/09/2018   Solitary pulmonary nodule on lung CT 03/08/2018   Ascending aortic aneurysm (HCC) 4.2 cm based on CT from December 2020 01/29/2018   Sinus bradycardia 12/22/2017   Left bundle branch block 12/19/2017   Personal history of pulmonary embolism - no longer on anticoagulation due to pt refusal to take it anymore. 12/19/2017   Chronic kidney disease, stage III (moderate) 12/19/2017   Benign neoplasm  of brain 12/19/2017    PCP: Cari Caraway, MD   REFERRING PROVIDER: Cari Caraway, MD   REFERRING DIAG: Falls, unsteady gait, mm weakness Rationale for Evaluation and Treatment Rehabilitation  THERAPY DIAG:  Unsteadiness on feet  Right sided weakness  Meningioma  Muscle weakness (generalized)  Other lack of coordination  Repeated falls  ONSET DATE: 05/10/2022   SUBJECTIVE:                                                                                                                                                                                           SUBJECTIVE STATEMENT: Patient reports no changes.  PERTINENT HISTORY:  Brief HPI:   Junious Schooley is a 77 y.o. male who fell backward striking his head and right neck shoulder area on 01/21/2022. He complained of right hemiparesis. CT head revealed left parafalcine meningioma.Started on Decadron with improvement in symptoms   PAIN:  Are you having pain? no PRECAUTIONS: ICD/Pacemaker  WEIGHT BEARING RESTRICTIONS No  FALLS:   Has patient fallen in last 6 months? Yes. Number of falls 2  LIVING ENVIRONMENT: Lives with: lives with their spouse Lives in: House/apartment Stairs: Yes: External: 7 steps; bilateral but cannot reach both Has following equipment at home: Walker - 2 wheeled and a cane, shower seat  OCCUPATION: Retired  PLOF: Independent  PATIENT GOALS Feel normal with standing and functioning.  Feel stronger and have better balance  OBJECTIVE:   DIAGNOSTIC FINDINGS:  CT Lumbar- 3. Moderate degenerative spinal stenosis at L2-3. CT CERVICAL IMPRESSION: 1. No evidence of intracranial injury. 2. Negative for cervical spine fracture but there is prevertebral edema at C4-5 suggesting soft tissue injury. 3. Meningiomas with left cerebral of edema, reference brain MRI 01/22/2022.  SCREENING FOR RED FLAGS: Bowel or bladder incontinence: No Spinal tumors: No Cauda equina syndrome: No Compression fracture: No Abdominal aneurysm: Yes: Abdominal aortic aneurysm, stable  COGNITION:  Overall cognitive status: WFL    SENSATION: WFL Patient reports tingling in B hands, all over.  MUSCLE LENGTH: Hamstrings: approximately 55 degrees B.  Was 70 degrees in September 2023  POSTURE: rounded shoulders and forward head  PALPATION: No TTP in R shoulder or up traps. B up traps tight, low back TTP R and L proximal to Illiac crest  LUMBAR ROM:  AROM limited in all directions, but he lost his balance frequently, so accuracy of testing limited.  LOWER EXTREMITY ROM:      General mild stiffness throughout BLE with PROM.  LOWER EXTREMITY MMT:   MMT Right eval Left eval  Hip flexion 4- 4-  Hip extension    Hip abduction 4 4  Hip adduction    Hip internal rotation    Hip external rotation    Knee flexion 4+ 4-  Knee extension 4+ 4-  Ankle dorsiflexion 4 4  Ankle plantarflexion    Ankle inversion    Ankle eversion     (Blank rows = not tested)  LUMBAR SPECIAL TESTS:  Slump test: Positive  R  Shoulder Special Tests empty can test (+) R, Hawkins-Kennedy test (+) R. Previous MRI reveals full tear of supraspinatus and partial tear of infraspinatus.  FUNCTIONAL TESTS:  5 times sit to stand: 30 seconds struggled with weak knees Timed up and go (TUG): 27 seconds with SPC   TUG 10/12/22 = 13 seconds without device  Functional gait assessment: not tested BERG balance test:  23/56, 10/12/22 =29/56  GAIT: Distance walked: 80 Assistive device utilized: SPC, very unsteady used walls to stabilize, knees very shaky and unsteady, needing CGA to min A Level of assistance: CGA to min A for LOB Comments: Patient was unsteady with turns, relied on UE support for balance., legs shaky, presents like he has neuropathy but he does not, also reports double vision  TODAY'S TREATMENT  12/15/22 NuStep L5 x 6 minutes B knee flex, 35#, 2 x 15 B knee ext 10#, 2 x 15 Leg press, 40#, 3 x 10 reps BLE, knee flex not quite to 90 degrees due to knee pain with deep squat. Standing on Air ex pad with 4# ball in BUE, OHP, chest press, and trunk rotation to each side x 10 each. Mod unsteadiness. Standing with back to wall, chin tuck to hold ball on wall, shoulder ext, rows, ER, horizontal abd against G tband, 2 x 10 reps each. B side to side stepping against G tband resistance in parallel bars, 2 x 10 reps  12/13/22 NuStep L5 x 6 minutes Functional re-assessment for PN-see goals Mini squats with hands on the back of a chair for steadying Seated rotation. R hand on mat, rotate to R to place hand on the mat, then reach back to the L. Seated sliding with rotation. Place both hands on mat to R, slowly slide hands out and back. Only go as far as possible without raising pain to 4 or >. Supine marching bridge reviewed and performed 3 x 10 reps.  12/07/22 NuStep L4 x 6 minutes B side stepping on air ex plank. Cued to slow down, very challenging with frequent grasp of the rail. 10 reps each way. Standing on air ex pad-  lat and forward and back weight shifts x 10 each, unsteady, but no LOB. Air ex pad while holding 4# ball, chest press x 10, then diagonals x 10 in each direction. Unsteady, with multiple LOB requiring UE support Repeated activity with one eye closed to eliminate double vision. improved balance and steadiness noted. B side step up onto and off Air ex pad, 12 times each direction. Mild- mod unsteadiness. Standing shoulder ext, rows with 15#, 2 x 10, ER 5#, 2 x 10 each Seated hip ext against Black Tband resistance, 2 x 10 reps Ambulation holding 7# weight in one hand-80' in R/80' in L hand, no unsteadiness.  PATIENT EDUCATION:  Education details: POC,  Person educated: Patient and Spouse Education method: Explanation Education comprehension: verbalized understanding   HOME EXERCISE PROGRAM: Access Code: [email or web address] URL: [email or web address] Date: 10/12/2022 Prepared by: Lum Babe  Exercises - Seated Isometric Cervical Sidebending  - 1 x daily - 7 x weekly - 2 sets - 10 reps - 3 hold -  Seated Isometric Cervical Rotation  - 1 x daily - 7 x weekly - 2 sets - 10 reps - 3 hold  [email or web address]   ASSESSMENT:  CLINICAL IMPRESSION: Patient reports no real changes. Treatment emphasized strength and balance training. He tolerated increased challenge today, remains unsteady.   OBJECTIVE IMPAIRMENTS Abnormal gait, decreased activity tolerance, decreased balance, decreased cognition, decreased coordination, difficulty walking, decreased ROM, decreased strength, increased muscle spasms, impaired flexibility, postural dysfunction, and pain.   ACTIVITY LIMITATIONS carrying, lifting, bending, standing, squatting, stairs, reach over head, and locomotion level  PARTICIPATION LIMITATIONS: cleaning, laundry, driving, shopping, and community activity  PERSONAL FACTORS Age and 1 comorbidity: multiple meningiomas  are also affecting patient's functional outcome.   REHAB POTENTIAL:  Good  CLINICAL DECISION MAKING: Evolving/moderate complexity  EVALUATION COMPLEXITY: Moderate   GOALS: Goals reviewed with patient? Yes  SHORT TERM GOALS: Target date: 10/04/22  I with initial HEP Baseline: Goal status: 10/21/22-met  LONG TERM GOALS: Target date: 12/20/22  I with final HEP Baseline:  Goal status: met 12/2422  2.  Patient will have no falls over a 6 week period Baseline: 2 falls  Goal status:met 11/08/22  3.  Complete 5 x STS in <12 sec to demonstrate improved LE strength Baseline: 30 seconds Goal status: 12/13/22-13 seconds-not met  4.  Patient will complete TUG in < 12 sec with or without AD Baseline: 27 seconds Goal status: 12/13/22-11.3, met  5.  Patient will score at least 43/56 on Berg balance Baseline: 23/56 Goal status: 12/07/22 progressing, 12/15/22-not met  6.  Patient will walk at least 600' with LRAD, MI on level an unlevel surfaces, back pain < 3/10 Baseline: 80', unsteady, SPC with CGA/Min A Goal status: 12/15/22-met   PLAN: PT FREQUENCY: 2x/week  PT DURATION: 12 weeks  PLANNED INTERVENTIONS: Therapeutic exercises, Therapeutic activity, Neuromuscular re-education, Balance training, Gait training, Patient/Family education, Self Care, Joint mobilization, Stair training, Dry Needling, Electrical stimulation, Cryotherapy, Moist heat, Ultrasound, Ionotophoresis 4mg /ml Dexamethasone, and Manual therapy.  PLAN FOR NEXT SESSION:   Re-assess for D/C. Ethel Rana DPT 12/15/22 11:45 AM

## 2022-12-16 DIAGNOSIS — Z8639 Personal history of other endocrine, nutritional and metabolic disease: Secondary | ICD-10-CM | POA: Diagnosis not present

## 2022-12-20 ENCOUNTER — Telehealth: Payer: Self-pay | Admitting: Cardiology

## 2022-12-20 NOTE — Telephone Encounter (Signed)
I faxed over office notes from Dr. Elberta Fortis and Dr. Bing Matter along with last Exho results to requesting provider's office.

## 2022-12-20 NOTE — Telephone Encounter (Signed)
   Patient Name: Timothy Moran  DOB: 05-18-1946 MRN: [id number]  Primary Cardiologist: Gypsy Balsam, MD  Chart reviewed as part of pre-operative protocol coverage. Cataract extractions are recognized in guidelines as low risk surgeries that do not typically require specific preoperative testing or holding of blood thinner therapy. Therefore, given past medical history and time since last visit, based on ACC/AHA guidelines, Timothy Moran would be at acceptable risk for the planned procedure without further cardiovascular testing.   I will route this recommendation to the requesting party via Epic fax function and remove from pre-op pool.  Please call with questions.  Ronney Asters, NP 12/20/2022, 11:40 AM

## 2022-12-20 NOTE — Telephone Encounter (Signed)
   Pre-operative Risk Assessment    Patient Name: Timothy Moran  DOB: 1946/06/04 MRN: [id number]      Request for Surgical Clearance    Procedure:   Cataract Surgery in the Right Eye  Date of Surgery:  Clearance 12/22/22                                 Surgeon:  Dr. Dimitri Ped Surgeon's Group or Practice Name:  [facility] Phone number:  [phone] Fax number:  [phone]   Type of Clearance Requested:   - Medical  - Pharmacy   Type of Anesthesia:  Local    Additional requests/questions:   Jennette Kettle called stating that the pt is having surgery on 12/22/22 and needs clearance. Jennette Kettle states that they do not have much knowledge on the pt about his medical history and would like to know if they can get a callback or fax with that information. Jennette Kettle was unsure if it was just clearance for his device or if he has medication that he needs to be cleared on as well. Please advise  Signed, Tawni Millers   12/20/2022, 11:27 AM

## 2022-12-21 DIAGNOSIS — L600 Ingrowing nail: Secondary | ICD-10-CM | POA: Diagnosis not present

## 2022-12-21 DIAGNOSIS — B351 Tinea unguium: Secondary | ICD-10-CM | POA: Diagnosis not present

## 2022-12-21 DIAGNOSIS — M79674 Pain in right toe(s): Secondary | ICD-10-CM | POA: Diagnosis not present

## 2022-12-21 DIAGNOSIS — M79675 Pain in left toe(s): Secondary | ICD-10-CM | POA: Diagnosis not present

## 2022-12-22 DIAGNOSIS — H25811 Combined forms of age-related cataract, right eye: Secondary | ICD-10-CM | POA: Diagnosis not present

## 2022-12-22 DIAGNOSIS — H269 Unspecified cataract: Secondary | ICD-10-CM | POA: Diagnosis not present

## 2022-12-27 ENCOUNTER — Ambulatory Visit: Payer: Medicare Other | Attending: Cardiology | Admitting: Cardiology

## 2022-12-27 ENCOUNTER — Telehealth: Payer: Self-pay

## 2022-12-27 ENCOUNTER — Encounter: Payer: Self-pay | Admitting: Cardiology

## 2022-12-27 VITALS — BP 124/72 | HR 72 | Ht 73.0 in | Wt 217.0 lb

## 2022-12-27 DIAGNOSIS — I2510 Atherosclerotic heart disease of native coronary artery without angina pectoris: Secondary | ICD-10-CM | POA: Diagnosis not present

## 2022-12-27 DIAGNOSIS — I493 Ventricular premature depolarization: Secondary | ICD-10-CM | POA: Diagnosis not present

## 2022-12-27 DIAGNOSIS — I5022 Chronic systolic (congestive) heart failure: Secondary | ICD-10-CM | POA: Diagnosis not present

## 2022-12-27 DIAGNOSIS — Z79899 Other long term (current) drug therapy: Secondary | ICD-10-CM

## 2022-12-27 NOTE — Telephone Encounter (Signed)
Per Dory Horn, RN. Dr. Elberta Fortis wants TSH at appt in June

## 2022-12-27 NOTE — Progress Notes (Signed)
Electrophysiology Office Note   Date:  12/27/2022   ID:  Timothy Moran, DOB 04-02-1946, MRN [id number]  PCP:  Gweneth Dimitri, MD  Cardiologist:  Bing Matter Primary Electrophysiologist:  Suhana Wilner Jorja Loa, MD    Chief Complaint: CHF   History of Present Illness: Timothy Moran is a 77 y.o. male who is being seen today for the evaluation of CHF at the request of Gweneth Dimitri, MD. Presenting today for electrophysiology evaluation.  He has a history seen for ascending aortic aneurysm, CKD stage III, coronary disease, type 2 diabetes, chronic systolic heart failure, PE, hyperlipidemia, hypertension.  In 2022 he was shoveling snow and had chest pain and collapsed.  He had ventricular fibrillation.  He was diagnosed with acute STEMI is post circumflex stent.  He then presented to the Cath Lab August 2022 and had stenting of an 80% LAD lesion.  He is status post Medtronic CRT-D implanted 11/03/2021.  He was found to have a low biventricular pacing burden due to PVCs and is now on amiodarone.  Today, denies symptoms of palpitations, chest pain, shortness of breath, orthopnea, PND, lower extremity edema, claudication, dizziness, presyncope, syncope, bleeding, or neurologic sequela. The patient is tolerating medications without difficulties.  He has been doing well over the last few months.  He is able to do his daily activities.  He has no major complaints.   Past Medical History:  Diagnosis Date   Acute blood loss anemia 10/05/2020   Acute respiratory failure (HCC), hypothermia therapy, vent - extubated 10/06/20    AKI (acute kidney injury) 10/05/2020   Anoxic brain injury 10/26/2020   Arrhythmia    Ascending aortic aneurysm 01/29/2018   a. 2019 4.3 cm by echo; b. 02/2021 Echo: Ao root 40mm.   Benign brain tumor    Benign neoplasm of brain 12/19/2017   Cardiac arrest with ventricular fibrillation    Chest pain 12/04/2017   Chronic kidney disease, stage III (moderate) 12/19/2017    CKD (chronic kidney disease), stage III - IV (HCC)    Coronary Artery Disease    a. 09/2020 Inf STEMI complicated by cardiac arrest/PCI: LAD 50p/m, LCX 100d (2.5 x 30 Resolute Onyx DES); b. 04/2021 PCI: 04/2021 LM nl, LAD 50p/42m (2.5x30 Onyx Frontier DES), LCX 25p, 6m (RFR 0.98), 40d ISR (RFR 0.98), OM2 25, RCA mild diff dzs.   Diabetes mellitus, type 2 10/06/2020   10/06/20 A1C 7%   Dizziness 10/11/2019   Encounter for imaging study to confirm orogastric (OG) tube placement    Hematuria 10/26/2020   HFrEF (heart failure with reduced ejection fraction)    a. 11/2020 Echo: EF 25-30%; b. 02/2021 Echo: EF 25-30%, glob HK. Mild LVH. Nl RV fxn. Triv AI. Ao root 40mm.   History of pulmonary embolus (PE)    HLD (hyperlipidemia) 10/26/2020   Hypertension    Iron deficiency anemia rec'd IV iron 10/26/2020   Ischemic cardiomyopathy    a. 11/2020 Echo: EF 25-30%; b. 02/2021 Echo: EF 25-30%, glob HK.   Left bundle branch block 12/19/2017   Meningioma 08/14/2020   Nonsustained ventricular tachycardia 10/11/2019   Pain of left hip joint 05/29/2020   Personal history of pulmonary embolism 12/19/2017   Pneumonia of both lungs due to infectious organism    Pulmonary hypertension due to thromboembolism 03/09/2018   See echo 12/27/17 with nl PAS vs CTa chest 02/23/18    S/P angioplasty with stent 10/04/20 DES to LCX  10/26/2020   Sinus bradycardia 12/22/2017   SOB (shortness of breath)  Solitary pulmonary nodule on lung CT 03/08/2018   CT 12/04/17 1.0 x 0.8 x 0.7 cm nodular opacity in the RUL vs not seen 03/16/10 posterior segment of the right upper lobe. Marland KitchenSpirometry 03/08/2018    FEV1 3.86 (108%)  Ratio 96 s prior rx  - PET  03/13/18   Low grade c/w adenoca > rec T surgery eval     STEMI (ST elevation myocardial infarction) 10/04/2020   Urinary retention 10/26/2020   Vestibular schwannoma 08/14/2020   Past Surgical History:  Procedure Laterality Date   APPENDECTOMY     BIV ICD INSERTION CRT-D N/A 11/03/2021    Procedure: BIV ICD INSERTION CRT-D;  Surgeon: Regan Lemming, MD;  Location: [facility] INVAS[facility] CV LAB;  Service: Cardiovascular;  Laterality: N/A;   CARDIAC CATHETERIZATION     CORONARY ANGIOPLASTY WITH STENT PLACEMENT Left 04/12/2021   LAD stent placement   CORONARY PRESSURE/FFR STUDY N/A 04/12/2021   Procedure: INTRAVASCULAR PRESSURE WIRE/FFR STUDY;  Surgeon: Yvonne Kendall, MD;  Location: [facility] INVASIVE CV LAB;  Service: Cardiovascular;  Laterality: N/A;   CORONARY STENT INTERVENTION N/A 04/12/2021   Procedure: CORONARY STENT INTERVENTION;  Surgeon: Yvonne Kendall, MD;  Location: [facility] INVASIVE CV LAB;  Service: Cardiovascular;  Laterality: N/A;   CORONARY ULTRASOUND/IVUS N/A 04/12/2021   Procedure: Intravascular Ultrasound/IVUS;  Surgeon: Yvonne Kendall, MD;  Location: [facility] INVASIVE CV LAB;  Service: Cardiovascular;  Laterality: N/A;   CORONARY/GRAFT ACUTE MI REVASCULARIZATION N/A 10/04/2020   Procedure: Coronary/Graft Acute MI Revascularization;  Surgeon: Yvonne Kendall, MD;  Location: [facility] INVASIVE CV LAB;  Service: Cardiovascular;  Laterality: N/A;   HERNIA REPAIR     LEFT HEART CATH AND CORONARY ANGIOGRAPHY N/A 10/04/2020   Procedure: LEFT HEART CATH AND CORONARY ANGIOGRAPHY;  Surgeon: Yvonne Kendall, MD;  Location: [facility] INVASIVE CV LAB;  Service: Cardiovascular;  Laterality: N/A;   RIGHT/LEFT HEART CATH AND CORONARY ANGIOGRAPHY N/A 04/12/2021   Procedure: RIGHT/LEFT HEART CATH AND CORONARY ANGIOGRAPHY;  Surgeon: Yvonne Kendall, MD;  Location: [facility] INVASIVE CV LAB;  Service: Cardiovascular;  Laterality: N/A;     Current Outpatient Medications  Medication Sig Dispense Refill   acetaminophen (TYLENOL) 325 MG tablet Take 1-2 tablets (325-650 mg total) by mouth every 4 (four) hours as needed for mild pain.     amiodarone (PACERONE) 200 MG tablet Take 100 mg by mouth daily.     aspirin EC 81 MG tablet Take 81 mg by mouth every other day. Swallow whole.     atorvastatin (LIPITOR) 80 MG tablet Take 1  tablet (80 mg total) by mouth daily. 90 tablet 2   B Complex-C (SUPER B COMPLEX PO) Take 1 tablet by mouth daily. Unknown strength     fluticasone (FLONASE) 50 MCG/ACT nasal spray Place 1-2 sprays into both nostrils at bedtime.  2   isosorbide dinitrate (ISORDIL) 30 MG tablet Take 1 tablet (30 mg total) by mouth 2 (two) times daily. 180 tablet 2   melatonin 5 MG TABS Take 1 tablet (5 mg total) by mouth at bedtime. 30 tablet 0   Multiple Vitamin (MULTIVITAMIN WITH MINERALS) TABS tablet Take 1 tablet by mouth daily.     senna-docusate (SENOKOT-S) 8.6-50 MG tablet Take 1 tablet by mouth at bedtime as needed for mild constipation.     sodium bicarbonate 650 MG tablet Take 650 mg by mouth 2 (two) times daily.     diclofenac Sodium (VOLTAREN) 1 % GEL Apply 4 g topically 4 (four) times daily. (Patient not taking: Reported on 12/27/2022) 100 g  0   No current facility-administered medications for this visit.    Allergies:   Hydralazine and Mexiletine   Social History:  The patient  reports that he quit smoking about 54 years ago. His smoking use included cigarettes. He has a 2.50 pack-year smoking history. He has never used smokeless tobacco. He reports current alcohol use. He reports that he does not use drugs.   Family History:  The patient's family history includes Brain cancer in his sister; Diabetes in his mother; Leukemia in his brother; Melanoma in his brother.   ROS:  Please see the history of present illness.   Otherwise, review of systems is positive for none.   All other systems are reviewed and negative.   PHYSICAL EXAM: VS:  BP 124/72   Pulse 72   Ht 6\' 1"  (1.854 m)   Wt 217 lb (98.4 kg)   SpO2 96%   BMI 28.63 kg/m  , BMI Body mass index is 28.63 kg/m. GEN: Well nourished, well developed, in no acute distress  HEENT: normal  Neck: no JVD, carotid bruits, or masses Cardiac: RRR; no murmurs, rubs, or gallops,no edema  Respiratory:  clear to auscultation bilaterally, normal work of  breathing GI: soft, nontender, nondistended, + BS MS: no deformity or atrophy  Skin: warm and dry, device site well healed Neuro:  Strength and sensation are intact Psych: euthymic mood, full affect  EKG:  EKG is ordered today. Personal review of the ekg ordered shows sinus rhythm, V paced, PVC  Personal review of the device interrogation today. Results in Paceart    Recent Labs: 01/25/2022: Magnesium 2.0 04/08/2022: NT-Pro BNP 3,220 08/24/2022: BUN 21; Creatinine, Ser 2.15; Potassium 4.6; Sodium 144 10/03/2022: ALT 20; Hemoglobin 12.2; Platelets 268 10/17/2022: TSH 5.400    Lipid Panel     Component Value Date/Time   CHOL 118 10/03/2022 1400   TRIG 218 (H) 10/03/2022 1400   HDL 31 (L) 10/03/2022 1400   CHOLHDL 3.8 10/03/2022 1400   CHOLHDL 3.2 04/13/2021 0317   VLDL 40 04/13/2021 0317   LDLCALC 52 10/03/2022 1400     Wt Readings from Last 3 Encounters:  12/27/22 217 lb (98.4 kg)  10/04/22 215 lb (97.5 kg)  10/03/22 215 lb 1.3 oz (97.6 kg)      Other studies Reviewed: Additional studies/ records that were reviewed today include: LHC 04/12/21  Review of the above records today demonstrates:  Severe single-vessel coronary artery disease with multifocal disease of the proximal and mid LAD of up to 80% that is hemodynamically significant by RFR and IVUS criteria. Moderate LCx disease with 50% de novo stenosis proximal to stent placed in 09/2020.  There is also 40% in-stent restenosis in the LCx/OM2 stent.  These lesions are not hemodynamically significant (RFR = 0.98). Mildly elevated left heart, right heart, and pulmonary artery pressures. Normal Fick cardiac output/index. Successful IVUS- and RFR-guided PCI to 80% mid LAD stenosis using Onyx Frontier 2.5 x 30 mm drug-eluting stent (postdilated to 3.2 mm) with 0% residual stenosis and TIMI-3 flow.   TTE 05/24/22  1. Left ventricular ejection fraction, by estimation, is 30 to 35%. The  left ventricle has moderately decreased  function. The left ventricle has  no regional wall motion abnormalities. Left ventricular diastolic  parameters are consistent with Grade I  diastolic dysfunction (impaired relaxation).   2. Right ventricular systolic function is mildly reduced. The right  ventricular size is normal. There is normal pulmonary artery systolic  pressure.   3. Left  atrial size was mildly dilated.   4. The mitral valve is degenerative. Mild to moderate mitral valve  regurgitation. No evidence of mitral stenosis.   5. The aortic valve is normal in structure. Aortic valve regurgitation is  mild. Aortic valve sclerosis is present, with no evidence of aortic valve  stenosis.   6. There is moderate dilatation of the ascending aorta, measuring 44 mm.   7. The inferior vena cava is normal in size with greater than 50%  respiratory variability, suggesting right atrial pressure of 3 mmHg.   ASSESSMENT AND PLAN:  1.  Chronic systolic heart failure: Due to ischemic cardiomyopathy.  Left status post Medtronic CRT-D implanted 11/03/2021.  Currently on medical therapy though metoprolol was stopped due to dizziness.  No further dizziness.  He has been feeling well.  Arabelle Bollig continue with current management.  2.  Coronary disease: Status post circumflex and LAD stents.  No current chest pain.  Plan per primary cardiology.    3.  PVCs: Low burden of biventricular pacing.  Was started on amiodarone.  Now BiV pacing percentage has gone up.  Shynice Sigel continue with current management.  4.  High risk medication monitoring: Currently on amiodarone.  TSH mildly elevated.  Daxter Paule have it rechecked in June.   Current medicines are reviewed at length with the patient today.   The patient does not have concerns regarding his medicines.  The following changes were made today: none  Labs/ tests ordered today include:  Orders Placed This Encounter  Procedures   EKG 12-Lead     Disposition:   FU 6 months  Signed, Kohle Winner Jorja Loa, MD   12/27/2022 2:09 PM     [facility] [address] [place] [address] [phone] (office) [phone] (fax)

## 2022-12-27 NOTE — Patient Instructions (Addendum)
Medication Instructions:  Your physician recommends that you continue on your current medications as directed. Please refer to the Current Medication list given to you today.  *If you need a refill on your cardiac medications before your next appointment, please call your pharmacy*   Lab Work: You need to have a TSH recheck at your office visit in June with Dr. Bing Matter  If you have labs (blood work) drawn today and your tests are completely normal, you will receive your results only by: MyChart Message (if you have MyChart) OR A paper copy in the mail If you have any lab test that is abnormal or we need to change your treatment, we will call you to review the results.   Testing/Procedures: None ordered   Follow-Up: At [facility], you and your health needs are our priority.  As part of our continuing mission to provide you with exceptional heart care, we have created designated Provider Care Teams.  These Care Teams include your primary Cardiologist (physician) and Advanced Practice Providers (APPs -  Physician Assistants and Nurse Practitioners) who all work together to provide you with the care you need, when you need it.  Remote monitoring is used to monitor your Pacemaker or ICD from home. This monitoring reduces the number of office visits required to check your device to one time per year. It allows [place] to keep an eye on the functioning of your device to ensure it is working properly. You are scheduled for a device check from home on 02/07/2023. You may send your transmission at any time that day. If you have a wireless device, the transmission will be sent automatically. After your physician reviews your transmission, you will receive a postcard with your next transmission date.  Your next appointment:   6 month(s)  The format for your next appointment:   In Person  Provider:   You will see one of the following Advanced Practice Providers on your designated Care Team:    Francis Dowse, [place] Casimiro Needle "Mardelle Matte" Lanna Poche, [place]   Thank you for choosing [facility]!!   Dory Horn, RN [phone]    Other Instructions

## 2023-01-05 DIAGNOSIS — H25812 Combined forms of age-related cataract, left eye: Secondary | ICD-10-CM | POA: Diagnosis not present

## 2023-01-05 DIAGNOSIS — H269 Unspecified cataract: Secondary | ICD-10-CM | POA: Diagnosis not present

## 2023-01-17 DIAGNOSIS — Z23 Encounter for immunization: Secondary | ICD-10-CM | POA: Diagnosis not present

## 2023-01-31 DIAGNOSIS — H2513 Age-related nuclear cataract, bilateral: Secondary | ICD-10-CM | POA: Diagnosis not present

## 2023-02-07 ENCOUNTER — Ambulatory Visit (INDEPENDENT_AMBULATORY_CARE_PROVIDER_SITE_OTHER): Payer: Medicare Other

## 2023-02-07 DIAGNOSIS — I255 Ischemic cardiomyopathy: Secondary | ICD-10-CM | POA: Diagnosis not present

## 2023-02-08 LAB — CUP PACEART REMOTE DEVICE CHECK
Battery Remaining Longevity: 101 mo
Battery Voltage: 3.01 V
Brady Statistic AP VP Percent: 7.02 %
Brady Statistic AP VS Percent: 0.05 %
Brady Statistic AS VP Percent: 91.21 %
Brady Statistic AS VS Percent: 1.72 %
Brady Statistic RA Percent Paced: 7.06 %
Brady Statistic RV Percent Paced: 86.74 %
Date Time Interrogation Session: 20240528023327
HighPow Impedance: 64 Ohm
Implantable Lead Connection Status: 753985
Implantable Lead Connection Status: 753985
Implantable Lead Connection Status: 753985
Implantable Lead Implant Date: 20230222
Implantable Lead Implant Date: 20230222
Implantable Lead Implant Date: 20230222
Implantable Lead Location: 753858
Implantable Lead Location: 753859
Implantable Lead Location: 753860
Implantable Lead Model: 4798
Implantable Lead Model: 5076
Implantable Pulse Generator Implant Date: 20230222
Lead Channel Impedance Value: 220.723
Lead Channel Impedance Value: 220.723
Lead Channel Impedance Value: 228 Ohm
Lead Channel Impedance Value: 247 Ohm
Lead Channel Impedance Value: 256.148
Lead Channel Impedance Value: 380 Ohm
Lead Channel Impedance Value: 399 Ohm
Lead Channel Impedance Value: 494 Ohm
Lead Channel Impedance Value: 494 Ohm
Lead Channel Impedance Value: 532 Ohm
Lead Channel Impedance Value: 570 Ohm
Lead Channel Impedance Value: 627 Ohm
Lead Channel Impedance Value: 646 Ohm
Lead Channel Impedance Value: 779 Ohm
Lead Channel Impedance Value: 779 Ohm
Lead Channel Impedance Value: 817 Ohm
Lead Channel Impedance Value: 836 Ohm
Lead Channel Impedance Value: 893 Ohm
Lead Channel Pacing Threshold Amplitude: 0.5 V
Lead Channel Pacing Threshold Amplitude: 0.625 V
Lead Channel Pacing Threshold Amplitude: 1.25 V
Lead Channel Pacing Threshold Pulse Width: 0.4 ms
Lead Channel Pacing Threshold Pulse Width: 0.4 ms
Lead Channel Pacing Threshold Pulse Width: 0.4 ms
Lead Channel Sensing Intrinsic Amplitude: 1 mV
Lead Channel Sensing Intrinsic Amplitude: 1 mV
Lead Channel Sensing Intrinsic Amplitude: 19.25 mV
Lead Channel Sensing Intrinsic Amplitude: 19.25 mV
Lead Channel Setting Pacing Amplitude: 1.5 V
Lead Channel Setting Pacing Amplitude: 1.75 V
Lead Channel Setting Pacing Amplitude: 2 V
Lead Channel Setting Pacing Pulse Width: 0.4 ms
Lead Channel Setting Pacing Pulse Width: 0.4 ms
Lead Channel Setting Sensing Sensitivity: 0.3 mV
Zone Setting Status: 755011
Zone Setting Status: 755011

## 2023-02-22 ENCOUNTER — Encounter: Payer: Self-pay | Admitting: Cardiology

## 2023-02-22 ENCOUNTER — Ambulatory Visit: Payer: Medicare Other | Attending: Cardiology | Admitting: Cardiology

## 2023-02-22 VITALS — BP 118/64 | HR 69 | Ht 73.0 in | Wt 219.0 lb

## 2023-02-22 DIAGNOSIS — Z950 Presence of cardiac pacemaker: Secondary | ICD-10-CM

## 2023-02-22 DIAGNOSIS — I255 Ischemic cardiomyopathy: Secondary | ICD-10-CM

## 2023-02-22 DIAGNOSIS — Z95820 Peripheral vascular angioplasty status with implants and grafts: Secondary | ICD-10-CM

## 2023-02-22 DIAGNOSIS — I2699 Other pulmonary embolism without acute cor pulmonale: Secondary | ICD-10-CM

## 2023-02-22 DIAGNOSIS — I2729 Other secondary pulmonary hypertension: Secondary | ICD-10-CM | POA: Diagnosis not present

## 2023-02-22 DIAGNOSIS — I7121 Aneurysm of the ascending aorta, without rupture: Secondary | ICD-10-CM | POA: Diagnosis not present

## 2023-02-22 DIAGNOSIS — I447 Left bundle-branch block, unspecified: Secondary | ICD-10-CM

## 2023-02-22 NOTE — Progress Notes (Signed)
Cardiology Office Note:    Date:  02/22/2023   ID:  Timothy Moran, DOB [birth year], MRN [id number]  PCP:  Gweneth Dimitri, MD  Cardiologist:  Gypsy Balsam, MD    Referring MD: Gweneth Dimitri, MD   Chief Complaint  Patient presents with   Follow-up    History of Present Illness:    Timothy Moran is a 77 y.o. male   with incredibly complex past medical history.  Initially he was seen in my office because of history of pulmonary emboli he did have mild pulmonary hypertension but overall was doing quite well.  However at the end of January he was shoveling the snow started having chest pain 1 back and collapse he was found to be in V. fib he was defibrillated brought to [facility] he was found to have acute STEMI involving circumflex artery that was addressed with PTCA and drug-eluting stent.  He does have residual 80% stenosis of the mid LAD he ended up having diminished ejection fraction with level of 25%.  He did wear LifeVest however absolutely hated this and does not want to wear it anymore.  He comes today to my office complaining of having some fatigue tiredness as well as dizziness" upon getting up. In August he did have PTCA and stenting of the LAD lesion he tolerated quite well and since that time he is doing much better.  He said he got much more energy.   Biggest complaint that he have his dizziness.  Quite extensive evaluation has been done look like this not cardiac related he recently had cardiac surgery done he also required special glasses after that week which he did not get is convinced that dizziness comes from the fact that he got 1 glasses in the process of trying to find out what the problem with his dizziness is we were forced to stop a lot of medications. Comes to my office today and I have to admit he looks pretty good he walking quite nicely to my office he is feeling good his wife confirmed that.  Denies have any chest pain tightness squeezing pressure burning  chest overall doing well no discharge from the defibrillator no chest pain  Past Medical History:  Diagnosis Date   Acute blood loss anemia 10/05/2020   Acute respiratory failure (HCC), hypothermia therapy, vent - extubated 10/06/20    AKI (acute kidney injury) (HCC) 10/05/2020   Anoxic brain injury (HCC) 10/26/2020   Arrhythmia    Ascending aortic aneurysm (HCC) 01/29/2018   a. 2019 4.3 cm by echo; b. 02/2021 Echo: Ao root 40mm.   Benign brain tumor (HCC)    Benign neoplasm of brain (HCC) 12/19/2017   Cardiac arrest with ventricular fibrillation (HCC)    Chest pain 12/04/2017   Chronic kidney disease, stage III (moderate) (HCC) 12/19/2017   CKD (chronic kidney disease), stage III - IV (HCC)    Conductive hearing loss of both ears 08/24/2018   Coronary Artery Disease    a. 09/2020 Inf STEMI complicated by cardiac arrest/PCI: LAD 50p/m, LCX 100d (2.5 x 30 Resolute Onyx DES); b. 04/2021 PCI: 04/2021 LM nl, LAD 50p/31m (2.5x30 Onyx Frontier DES), LCX 25p, 40m (RFR 0.98), 40d ISR (RFR 0.98), OM2 25, RCA mild diff dzs.   Diabetes mellitus, type 2 (HCC) 10/06/2020   10/06/20 A1C 7%   Dizziness 10/11/2019   Encounter for imaging study to confirm orogastric (OG) tube placement    Eustachian tube dysfunction 08/24/2018   Hematuria 10/26/2020   HFrEF (heart  failure with reduced ejection fraction) (HCC)    a. 11/2020 Echo: EF 25-30%; b. 02/2021 Echo: EF 25-30%, glob HK. Mild LVH. Nl RV fxn. Triv AI. Ao root 40mm.   History of pulmonary embolus (PE)    HLD (hyperlipidemia) 10/26/2020   Hypertension    Iron deficiency anemia rec'd IV iron 10/26/2020   Ischemic cardiomyopathy    a. 11/2020 Echo: EF 25-30%; b. 02/2021 Echo: EF 25-30%, glob HK.   Left bundle branch block 12/19/2017   Meningioma (HCC) 08/14/2020   Nonsustained ventricular tachycardia (HCC) 10/11/2019   Pain of left hip joint 05/29/2020   Personal history of pulmonary embolism 12/19/2017   Pneumonia of both lungs due to infectious  organism    Pulmonary hypertension due to thromboembolism (HCC) 03/09/2018   See echo 12/27/17 with nl PAS vs CTa chest 02/23/18    S/P angioplasty with stent 10/04/20 DES to LCX  10/26/2020   Sinus bradycardia 12/22/2017   SOB (shortness of breath)    Solitary pulmonary nodule on lung CT 03/08/2018   CT 12/04/17 1.0 x 0.8 x 0.7 cm nodular opacity in the RUL vs not seen 03/16/10 posterior segment of the right upper lobe. Marland KitchenSpirometry 03/08/2018    FEV1 3.86 (108%)  Ratio 96 s prior rx  - PET  03/13/18   Low grade c/w adenoca > rec T surgery eval     STEMI (ST elevation myocardial infarction) (HCC) 10/04/2020   Urinary retention 10/26/2020   Vestibular schwannoma (HCC) 08/14/2020    Past Surgical History:  Procedure Laterality Date   APPENDECTOMY     BIV ICD INSERTION CRT-D N/A 11/03/2021   Procedure: BIV ICD INSERTION CRT-D;  Surgeon: Regan Lemming, MD;  Location: [facility] INVASIVE CV LAB;  Service: Cardiovascular;  Laterality: N/A;   CARDIAC CATHETERIZATION     CORONARY ANGIOPLASTY WITH STENT PLACEMENT Left 04/12/2021   LAD stent placement   CORONARY PRESSURE/FFR STUDY N/A 04/12/2021   Procedure: INTRAVASCULAR PRESSURE WIRE/FFR STUDY;  Surgeon: Yvonne Kendall, MD;  Location: [facility] INVASIVE CV LAB;  Service: Cardiovascular;  Laterality: N/A;   CORONARY STENT INTERVENTION N/A 04/12/2021   Procedure: CORONARY STENT INTERVENTION;  Surgeon: Yvonne Kendall, MD;  Location: [facility] INVASIVE CV LAB;  Service: Cardiovascular;  Laterality: N/A;   CORONARY ULTRASOUND/IVUS N/A 04/12/2021   Procedure: Intravascular Ultrasound/IVUS;  Surgeon: Yvonne Kendall, MD;  Location: [facility] INVASIVE CV LAB;  Service: Cardiovascular;  Laterality: N/A;   CORONARY/GRAFT ACUTE MI REVASCULARIZATION N/A 10/04/2020   Procedure: Coronary/Graft Acute MI Revascularization;  Surgeon: Yvonne Kendall, MD;  Location: [facility] INVASIVE CV LAB;  Service: Cardiovascular;  Laterality: N/A;   HERNIA REPAIR     LEFT HEART CATH AND CORONARY ANGIOGRAPHY N/A  10/04/2020   Procedure: LEFT HEART CATH AND CORONARY ANGIOGRAPHY;  Surgeon: Yvonne Kendall, MD;  Location: [facility] INVASIVE CV LAB;  Service: Cardiovascular;  Laterality: N/A;   RIGHT/LEFT HEART CATH AND CORONARY ANGIOGRAPHY N/A 04/12/2021   Procedure: RIGHT/LEFT HEART CATH AND CORONARY ANGIOGRAPHY;  Surgeon: Yvonne Kendall, MD;  Location: [facility] INVASIVE CV LAB;  Service: Cardiovascular;  Laterality: N/A;    Current Medications: Current Meds  Medication Sig   acetaminophen (TYLENOL) 325 MG tablet Take 1-2 tablets (325-650 mg total) by mouth every 4 (four) hours as needed for mild pain.   amiodarone (PACERONE) 200 MG tablet Take 100 mg by mouth daily.   aspirin EC 81 MG tablet Take 81 mg by mouth every other day. Swallow whole.   atorvastatin (LIPITOR) 80 MG tablet Take 1 tablet (80 mg  total) by mouth daily.   B Complex-C (SUPER B COMPLEX PO) Take 1 tablet by mouth daily. Unknown strength   diclofenac Sodium (VOLTAREN) 1 % GEL Apply 4 g topically 4 (four) times daily.   fluticasone (FLONASE) 50 MCG/ACT nasal spray Place 1-2 sprays into both nostrils at bedtime.   isosorbide dinitrate (ISORDIL) 30 MG tablet Take 1 tablet (30 mg total) by mouth 2 (two) times daily.   melatonin 5 MG TABS Take 1 tablet (5 mg total) by mouth at bedtime.   Multiple Vitamin (MULTIVITAMIN WITH MINERALS) TABS tablet Take 1 tablet by mouth daily.   senna-docusate (SENOKOT-S) 8.6-50 MG tablet Take 1 tablet by mouth at bedtime as needed for mild constipation.   sodium bicarbonate 650 MG tablet Take 650 mg by mouth 2 (two) times daily.     Allergies:   Hydralazine and Mexiletine   Social History   Socioeconomic History   Marital status: Married    Spouse name: Adler Ludovico    Number of children: 1   Years of education: 16   Highest education level: Bachelor's degree (e.g., BA, AB, BS)  Occupational History   Not on file  Tobacco Use   Smoking status: Former    Packs/day: 0.50    Years: 5.00    Additional pack  years: 0.00    Total pack years: 2.50    Types: Cigarettes    Quit date: 09/12/1968    Years since quitting: 54.4   Smokeless tobacco: Never  Vaping Use   Vaping Use: Never used  Substance and Sexual Activity   Alcohol use: Yes    Comment: ONE PER WEEK   Drug use: Never   Sexual activity: Not Currently  Other Topics Concern   Not on file  Social History Narrative   Not on file   Social Determinants of Health   Financial Resource Strain: Not on file  Food Insecurity: Not on file  Transportation Needs: Not on file  Physical Activity: Not on file  Stress: Not on file  Social Connections: Not on file     Family History: The patient's family history includes Brain cancer in his sister; Diabetes in his mother; Leukemia in his brother; Melanoma in his brother. ROS:   Please see the history of present illness.    All 14 point review of systems negative except as described per history of present illness  EKGs/Labs/Other Studies Reviewed:      Recent Labs: 04/08/2022: NT-Pro BNP 3,220 08/24/2022: BUN 21; Creatinine, Ser 2.15; Potassium 4.6; Sodium 144 10/03/2022: ALT 20; Hemoglobin 12.2; Platelets 268 10/17/2022: TSH 5.400  Recent Lipid Panel    Component Value Date/Time   CHOL 118 10/03/2022 1400   TRIG 218 (H) 10/03/2022 1400   HDL 31 (L) 10/03/2022 1400   CHOLHDL 3.8 10/03/2022 1400   CHOLHDL 3.2 04/13/2021 0317   VLDL 40 04/13/2021 0317   LDLCALC 52 10/03/2022 1400    Physical Exam:    VS:  BP 118/64 (BP Location: Left Arm, Patient Position: Sitting)   Pulse 69   Ht 6\' 1"  (1.854 m)   Wt 219 lb (99.3 kg)   SpO2 97%   BMI 28.89 kg/m     Wt Readings from Last 3 Encounters:  02/22/23 219 lb (99.3 kg)  12/27/22 217 lb (98.4 kg)  10/04/22 215 lb (97.5 kg)     GEN:  Well nourished, well developed in no acute distress HEENT: Normal NECK: No JVD; No carotid bruits LYMPHATICS: No lymphadenopathy CARDIAC: RRR,  no murmurs, no rubs, no gallops RESPIRATORY:  Clear to  auscultation without rales, wheezing or rhonchi  ABDOMEN: Soft, non-tender, non-distended MUSCULOSKELETAL:  No edema; No deformity  SKIN: Warm and dry LOWER EXTREMITIES: no swelling NEUROLOGIC:  Alert and oriented x 3 PSYCHIATRIC:  Normal affect   ASSESSMENT:    1. Ischemic cardiomyopathy   2. Left bundle branch block   3. Pulmonary hypertension due to thromboembolism (HCC)   4. Aneurysm of ascending aorta without rupture (HCC)   5. S/P angioplasty with stent 10/04/20 DES to LCX    6. Status post biventricular pacemaker - MRI compatible PPM and leads    PLAN:    In order of problems listed above:  Ischemic cardiomyopathy not on many medications the only thing he takes his isosorbide he had to stop all different medication because of intolerance to it.  I will repeat echocardiogram to check left ventricle ejection fraction 20 determine how aggressive we need to be to put him back on this medication. Left bundle branch block noted, status post BiV pacing. Dyslipidemia he is on high intense statin from Lipitor 80, I did review K PN which show me his LDL of 52 HDL 31 continue present management. Aneurysm of the ascending aorta will continue monitoring. BiV pacemaker present is MRI compatible Medtronic device normal function, OptiVol is low,   Medication Adjustments/Labs and Tests Ordered: Current medicines are reviewed at length with the patient today.  Concerns regarding medicines are outlined above.  No orders of the defined types were placed in this encounter.  Medication changes: No orders of the defined types were placed in this encounter.   Signed, Georgeanna Lea, MD, [facility] 02/22/2023 2:54 PM    [facility]

## 2023-02-22 NOTE — Patient Instructions (Signed)
Medication Instructions:  Your physician recommends that you continue on your current medications as directed. Please refer to the Current Medication list given to you today.  *If you need a refill on your cardiac medications before your next appointment, please call your pharmacy*   Lab Work: None If you have labs (blood work) drawn today and your tests are completely normal, you will receive your results only by: MyChart Message (if you have MyChart) OR A paper copy in the mail If you have any lab test that is abnormal or we need to change your treatment, we will call you to review the results.   Testing/Procedures: Your physician has requested that you have an echocardiogram. Echocardiography is a painless test that uses sound waves to create images of your heart. It provides your doctor with information about the size and shape of your heart and how well your heart's chambers and valves are working. This procedure takes approximately one hour. There are no restrictions for this procedure. Please do NOT wear cologne, perfume, aftershave, or lotions (deodorant is allowed). Please arrive 15 minutes prior to your appointment time.    Follow-Up: At [facility], you and your health needs are our priority.  As part of our continuing mission to provide you with exceptional heart care, we have created designated Provider Care Teams.  These Care Teams include your primary Cardiologist (physician) and Advanced Practice Providers (APPs -  Physician Assistants and Nurse Practitioners) who all work together to provide you with the care you need, when you need it.  We recommend signing up for the patient portal called "MyChart".  Sign up information is provided on this After Visit Summary.  MyChart is used to connect with patients for Virtual Visits (Telemedicine).  Patients are able to view lab/test results, encounter notes, upcoming appointments, etc.  Non-urgent messages can be sent to your  provider as well.   To learn more about what you can do with MyChart, go to [email or web address].    Your next appointment:   5 month(s)  Provider:   Robert Krasowski, MD    Other Instructions None  

## 2023-02-24 DIAGNOSIS — I25118 Atherosclerotic heart disease of native coronary artery with other forms of angina pectoris: Secondary | ICD-10-CM | POA: Diagnosis not present

## 2023-02-24 DIAGNOSIS — E1122 Type 2 diabetes mellitus with diabetic chronic kidney disease: Secondary | ICD-10-CM | POA: Diagnosis not present

## 2023-02-24 DIAGNOSIS — N1832 Chronic kidney disease, stage 3b: Secondary | ICD-10-CM | POA: Diagnosis not present

## 2023-02-24 DIAGNOSIS — I10 Essential (primary) hypertension: Secondary | ICD-10-CM | POA: Diagnosis not present

## 2023-03-06 DIAGNOSIS — Z950 Presence of cardiac pacemaker: Secondary | ICD-10-CM | POA: Diagnosis not present

## 2023-03-06 DIAGNOSIS — D329 Benign neoplasm of meninges, unspecified: Secondary | ICD-10-CM | POA: Diagnosis not present

## 2023-03-06 DIAGNOSIS — J9811 Atelectasis: Secondary | ICD-10-CM | POA: Diagnosis not present

## 2023-03-06 DIAGNOSIS — Z45018 Encounter for adjustment and management of other part of cardiac pacemaker: Secondary | ICD-10-CM | POA: Diagnosis not present

## 2023-03-06 DIAGNOSIS — J949 Pleural condition, unspecified: Secondary | ICD-10-CM | POA: Diagnosis not present

## 2023-03-06 NOTE — Progress Notes (Signed)
Remote ICD transmission.   

## 2023-03-08 DIAGNOSIS — D329 Benign neoplasm of meninges, unspecified: Secondary | ICD-10-CM | POA: Diagnosis not present

## 2023-03-12 ENCOUNTER — Other Ambulatory Visit: Payer: Self-pay | Admitting: Cardiology

## 2023-03-20 DIAGNOSIS — E1122 Type 2 diabetes mellitus with diabetic chronic kidney disease: Secondary | ICD-10-CM | POA: Diagnosis not present

## 2023-03-21 ENCOUNTER — Other Ambulatory Visit (HOSPITAL_BASED_OUTPATIENT_CLINIC_OR_DEPARTMENT_OTHER): Payer: Medicare Other

## 2023-03-21 ENCOUNTER — Ambulatory Visit (HOSPITAL_BASED_OUTPATIENT_CLINIC_OR_DEPARTMENT_OTHER)
Admission: RE | Admit: 2023-03-21 | Discharge: 2023-03-21 | Disposition: A | Discharge status: Home / Self Care | Payer: Medicare Other | Source: Ambulatory Visit | Attending: Cardiology | Admitting: Cardiology

## 2023-03-21 DIAGNOSIS — I2699 Other pulmonary embolism without acute cor pulmonale: Secondary | ICD-10-CM | POA: Diagnosis not present

## 2023-03-21 DIAGNOSIS — I7121 Aneurysm of the ascending aorta, without rupture: Secondary | ICD-10-CM | POA: Insufficient documentation

## 2023-03-21 DIAGNOSIS — I255 Ischemic cardiomyopathy: Secondary | ICD-10-CM | POA: Insufficient documentation

## 2023-03-21 DIAGNOSIS — Z950 Presence of cardiac pacemaker: Secondary | ICD-10-CM | POA: Diagnosis not present

## 2023-03-21 DIAGNOSIS — I447 Left bundle-branch block, unspecified: Secondary | ICD-10-CM | POA: Insufficient documentation

## 2023-03-21 DIAGNOSIS — I2729 Other secondary pulmonary hypertension: Secondary | ICD-10-CM | POA: Diagnosis not present

## 2023-03-21 DIAGNOSIS — Z95820 Peripheral vascular angioplasty status with implants and grafts: Secondary | ICD-10-CM | POA: Diagnosis not present

## 2023-03-21 LAB — ECHOCARDIOGRAM COMPLETE
Area-P 1/2: 2.87 cm2
MV M vel: 5.6 m/s
MV Peak grad: 125.4 mmHg
P 1/2 time: 624 msec
Radius: 0.4 cm
S' Lateral: 4.4 cm

## 2023-03-21 MED ORDER — PERFLUTREN LIPID MICROSPHERE
1.0000 mL | INTRAVENOUS | Status: AC | PRN
  Administered 2023-03-21: 7 mL via INTRAVENOUS

## 2023-03-24 ENCOUNTER — Telehealth: Payer: Self-pay

## 2023-03-24 NOTE — Telephone Encounter (Signed)
Results reviewed with pt as per Dr. Krasowski's note.  Pt verbalized understanding and had no additional questions. Routed to PCP  

## 2023-03-30 ENCOUNTER — Encounter: Payer: Self-pay | Admitting: Physical Therapy

## 2023-03-30 ENCOUNTER — Ambulatory Visit: Payer: Medicare Other | Attending: Family Medicine | Admitting: Physical Therapy

## 2023-03-30 DIAGNOSIS — R278 Other lack of coordination: Secondary | ICD-10-CM | POA: Diagnosis not present

## 2023-03-30 DIAGNOSIS — R531 Weakness: Secondary | ICD-10-CM | POA: Insufficient documentation

## 2023-03-30 DIAGNOSIS — D329 Benign neoplasm of meninges, unspecified: Secondary | ICD-10-CM | POA: Diagnosis not present

## 2023-03-30 DIAGNOSIS — G8929 Other chronic pain: Secondary | ICD-10-CM | POA: Diagnosis not present

## 2023-03-30 DIAGNOSIS — R2681 Unsteadiness on feet: Secondary | ICD-10-CM | POA: Diagnosis not present

## 2023-03-30 DIAGNOSIS — M6281 Muscle weakness (generalized): Secondary | ICD-10-CM | POA: Diagnosis not present

## 2023-03-30 DIAGNOSIS — R296 Repeated falls: Secondary | ICD-10-CM | POA: Insufficient documentation

## 2023-03-30 DIAGNOSIS — M25561 Pain in right knee: Secondary | ICD-10-CM | POA: Diagnosis not present

## 2023-03-30 NOTE — Therapy (Signed)
OUTPATIENT PHYSICAL THERAPY LOWER EXTREMITY EVALUATION   Patient Name: Timothy Moran MRN: [id number] DOB:1946-01-05, 77 y.o., male Today's Date: 03/30/2023  END OF SESSION:  PT End of Session - 03/30/23 1623     Visit Number 1    Date for PT Re-Evaluation 06/08/23    PT Start Time 1536    PT Stop Time 1614    PT Time Calculation (min) 38 min    Activity Tolerance Patient tolerated treatment well    Behavior During Therapy [facility] for tasks assessed/performed             Past Medical History:  Diagnosis Date   Acute blood loss anemia 10/05/2020   Acute respiratory failure (HCC), hypothermia therapy, vent - extubated 10/06/20    AKI (acute kidney injury) (HCC) 10/05/2020   Anoxic brain injury (HCC) 10/26/2020   Arrhythmia    Ascending aortic aneurysm (HCC) 01/29/2018   a. 2019 4.3 cm by echo; b. 02/2021 Echo: Ao root 40mm.   Benign brain tumor (HCC)    Benign neoplasm of brain (HCC) 12/19/2017   Cardiac arrest with ventricular fibrillation (HCC)    Chest pain 12/04/2017   Chronic kidney disease, stage III (moderate) (HCC) 12/19/2017   CKD (chronic kidney disease), stage III - IV (HCC)    Conductive hearing loss of both ears 08/24/2018   Coronary Artery Disease    a. 09/2020 Inf STEMI complicated by cardiac arrest/PCI: LAD 50p/m, LCX 100d (2.5 x 30 Resolute Onyx DES); b. 04/2021 PCI: 04/2021 LM nl, LAD 50p/42m (2.5x30 Onyx Frontier DES), LCX 25p, 21m (RFR 0.98), 40d ISR (RFR 0.98), OM2 25, RCA mild diff dzs.   Diabetes mellitus, type 2 (HCC) 10/06/2020   10/06/20 A1C 7%   Dizziness 10/11/2019   Encounter for imaging study to confirm orogastric (OG) tube placement    Eustachian tube dysfunction 08/24/2018   Hematuria 10/26/2020   HFrEF (heart failure with reduced ejection fraction) (HCC)    a. 11/2020 Echo: EF 25-30%; b. 02/2021 Echo: EF 25-30%, glob HK. Mild LVH. Nl RV fxn. Triv AI. Ao root 40mm.   History of pulmonary embolus (PE)    HLD (hyperlipidemia) 10/26/2020    Hypertension    Iron deficiency anemia rec'd IV iron 10/26/2020   Ischemic cardiomyopathy    a. 11/2020 Echo: EF 25-30%; b. 02/2021 Echo: EF 25-30%, glob HK.   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 Marland KitchenSpirometry 03/08/2018    FEV1 3.86 (108%)  Ratio 96 s prior rx  - PET  03/13/18   Low grade c/w adenoca > rec T surgery eval     STEMI (ST elevation myocardial infarction) (HCC) 10/04/2020   Urinary retention 10/26/2020   Vestibular schwannoma (HCC) 08/14/2020   Past Surgical History:  Procedure Laterality Date   APPENDECTOMY     BIV ICD INSERTION  CRT-D N/A 11/03/2021   Procedure: BIV ICD INSERTION CRT-D;  Surgeon: Regan Lemming, MD;  Location: [facility] INVASIVE CV LAB;  Service: Cardiovascular;  Laterality: N/A;   CARDIAC CATHETERIZATION     CORONARY ANGIOPLASTY WITH STENT PLACEMENT Left 04/12/2021   LAD stent placement   CORONARY PRESSURE/FFR STUDY N/A 04/12/2021   Procedure: INTRAVASCULAR PRESSURE WIRE/FFR STUDY;  Surgeon: Yvonne Kendall, MD;  Location: [facility] INVASIVE CV LAB;  Service: Cardiovascular;  Laterality: N/A;   CORONARY STENT INTERVENTION N/A 04/12/2021   Procedure: CORONARY STENT INTERVENTION;  Surgeon: Yvonne Kendall, MD;  Location: [facility] INVASIVE CV LAB;  Service: Cardiovascular;  Laterality: N/A;   CORONARY ULTRASOUND/IVUS N/A 04/12/2021   Procedure:  Intravascular Ultrasound/IVUS;  Surgeon: Yvonne Kendall, MD;  Location: [facility] INVASIVE CV LAB;  Service: Cardiovascular;  Laterality: N/A;   CORONARY/GRAFT ACUTE MI REVASCULARIZATION N/A 10/04/2020   Procedure: Coronary/Graft Acute MI Revascularization;  Surgeon: Yvonne Kendall, MD;  Location: [facility] INVASIVE CV LAB;  Service: Cardiovascular;  Laterality: N/A;   HERNIA REPAIR     LEFT HEART CATH AND CORONARY ANGIOGRAPHY N/A 10/04/2020   Procedure: LEFT HEART CATH AND CORONARY ANGIOGRAPHY;  Surgeon: Yvonne Kendall, MD;  Location: [facility] INVASIVE CV LAB;  Service: Cardiovascular;  Laterality: N/A;   RIGHT/LEFT HEART CATH AND CORONARY ANGIOGRAPHY N/A 04/12/2021   Procedure: RIGHT/LEFT HEART CATH AND CORONARY ANGIOGRAPHY;  Surgeon: Yvonne Kendall, MD;  Location: [facility] INVASIVE CV LAB;  Service: Cardiovascular;  Laterality: N/A;   Patient Active Problem List   Diagnosis Date Noted   Anemia    Leukocytosis    Right arm weakness 01/25/2022   Right sided weakness    Fall at home 01/22/2022   Status post biventricular pacemaker - MRI compatible PPM and leads 01/22/2022   Chronic indwelling Foley catheter 01/22/2022   CKD (chronic kidney disease), stage III - IV (HCC) 06/28/2021   Chronic HFrEF (heart failure with reduced ejection fraction) (HCC) 03/31/2021   Arrhythmia    DM (diabetes mellitus), type 2 (HCC) new 10/26/2020   Urinary retention 10/26/2020   Hyperlipidemia LDL goal <70 10/26/2020   Coronary artery disease of native artery of native heart with stable angina pectoris (HCC) 10/26/2020   S/P angioplasty with stent 10/04/20 DES to LCX  10/26/2020   Ischemic cardiomyopathy 10/26/2020   Diabetes mellitus, type 2 (HCC) 10/06/2020   DOE (dyspnea on exertion)    Meningioma (HCC) 08/14/2020   Vestibular schwannoma (HCC) 08/14/2020   Cardiac arrest with ventricular fibrillation (HCC)    Pain of left hip joint 05/29/2020   Benign brain tumor (HCC)    Chronic kidney disease    History of pulmonary  embolus (PE)    Hypertension    Nonsustained ventricular tachycardia (HCC) 10/11/2019   Dizziness 10/11/2019   Abnormal echocardiogram 06/07/2019   Eustachian tube dysfunction 08/24/2018   Conductive hearing loss of both ears 08/24/2018   Pulmonary hypertension due to thromboembolism (HCC) 03/09/2018   Solitary pulmonary nodule on lung CT 03/08/2018   Ascending aortic aneurysm (HCC) 4.2 cm based on CT from December 2020 01/29/2018   Sinus bradycardia 12/22/2017   Left bundle branch block 12/19/2017   Personal history of pulmonary embolism - no longer on anticoagulation due to pt refusal to take it anymore. 12/19/2017   Chronic kidney disease, stage III (moderate) (HCC) 12/19/2017   Benign neoplasm of brain (HCC) 12/19/2017    PCP: Gweneth Dimitri, MD  REFERRING PROVIDER: Abram Sander DIAG:  [phone] (ICD-10-CM) - Left hip pain  M25.551 (ICD-10-CM) - Pain in  right hip  M54.2 (ICD-10-CM) - Cervicalgia    THERAPY DIAG:  Unsteadiness on feet  Right sided weakness  Meningioma (HCC)  Muscle weakness (generalized)  Other lack of coordination  Rationale for Evaluation and Treatment: Rehabilitation  ONSET DATE: 03/20/23  SUBJECTIVE:   SUBJECTIVE STATEMENT: Patient reports he finally got his prism glasses last week. They have corrected his double visions. He feels like he is still having issues from his previous fall. He is performing HEP about every other day including strength and stretching. His R side feels numb and he is having pain in his R knee which prevents him from sleeping. He also reports hip pain. In addition, his back hurts with rotation.  PERTINENT HISTORY: Per previous therapy notes: left parafalcine meningioma. Frequent falls, R RC injury  PAIN:  Are you having pain? Yes: NPRS scale: 5/10 Pain location: R knee pain-distal/lat/ant to knee. He also reports B hip pain and back Pain description: sharp Aggravating factors: Hurts the most at night Relieving  factors: Salon Pase  PRECAUTIONS: Fall  RED FLAGS: None   WEIGHT BEARING RESTRICTIONS: No  FALLS:  Has patient fallen in last 6 months? No  LIVING ENVIRONMENT: Lives with: lives with their family Lives in: House/apartment Stairs: Yes: External: 7 steps; bilateral but cannot reach both Has following equipment at home: Single point cane, Walker - 2 wheeled, and shower chair  OCCUPATION: Retired  PLOF: Independent  PATIENT GOALS: Improve his ROM in his joints without pain.  NEXT MD VISIT: This afternoon  OBJECTIVE:   DIAGNOSTIC FINDINGS:  CT Lumbar- 3. Moderate degenerative spinal stenosis at L2-3. CT CERVICAL IMPRESSION: 1. No evidence of intracranial injury. 2. Negative for cervical spine fracture but there is prevertebral edema at C4-5 suggesting soft tissue injury. 3. Meningiomas with left cerebral of edema, reference brain MRI 01/22/2022.  COGNITION: Overall cognitive status: Within functional limits for tasks assessed     SENSATION: Patient reports numbness in entire R U and LE  EDEMA:  Patient denies  MUSCLE LENGTH: Hamstrings: B HS 70 Thomas test: B hip flexor tightness  POSTURE: rounded shoulders, forward head, decreased lumbar lordosis, and decreased thoracic kyphosis  PALPATION: TTP along B lumbar paraspinals and QL near iliac crests  CERVICAL ROM:  < 50% in all directions with muscle pull at end range  LUMBAR ROM: 50/70% in all directions with pain when sidebending to L and rotating to R.   LOWER EXTREMITY ROM: B hip ROM limited mildly in all planes, general stiffness in all joints, but WFL  LOWER EXTREMITY MMT: 4-(4-)/5, throughout, shaky with decreased control and clonic movements  LOWER EXTREMITY SPECIAL TESTS:  Hip special tests: Luisa Hart (FABER) test: positive  and Hip scouring test: negative Knee special tests: Patellafemoral apprehension test: positive  R knee started to sublux with minimal lateral press, causing pain   FUNCTIONAL  TESTS:  5 times sit to stand: TBD Timed up and go (TUG): TBD  GAIT: Distance walked: In Clinic Distances Assistive device utilized: None Level of assistance: Complete Independence Comments: Patient appeared much more stable when entering. Rose from chair and walked back with no sway or unsteadiness. However, later in the assessment he experienced unsteadiness when rising from chair.   TODAY'S TREATMENT:  DATE:  03/30/23 Education   PATIENT EDUCATION:  Education details: POC Person educated: Patient Education method: Explanation Education comprehension: verbalized understanding  HOME EXERCISE PROGRAM: [id number]-from previous round of PT  ASSESSMENT:  CLINICAL IMPRESSION: Patient is a 77 y.o. who was seen today for physical therapy evaluation and treatment for R sided weakness and numbness, R knee pain, low back pain. He reports no additional falls since previous bout of therapy. He is performing his previous HEP, but continues to have similar issues. He did get a new prescription for his glasses and no longer sees double. He initially demonstrated improved stability when rising from chair, but later during evaluation rose from chair and was unsteady, demonstrating some tonic movements in BLE and decreased control. He was scheduled to see his primary Dr today due to the increased pain in R knee. He demonstrates decreased ROM and weakness in B LE and trunk as well as pain, lateral tracking patella, poor R VMO activation and (+) R patellar apprehension test. Plan to re-assess HEP, update as needed to address weakness and stiffness and improve balance in standing.  OBJECTIVE IMPAIRMENTS: Abnormal gait, decreased activity tolerance, decreased balance, decreased coordination, decreased mobility, difficulty walking, decreased ROM, decreased strength, impaired flexibility,  and pain.   ACTIVITY LIMITATIONS: carrying, lifting, bending, standing, squatting, stairs, transfers, and locomotion level  PARTICIPATION LIMITATIONS: meal prep, cleaning, laundry, shopping, and community activity  PERSONAL FACTORS: Past/current experiences and 1 comorbidity: Memingioma  are also affecting patient's functional outcome.   REHAB POTENTIAL: Good  CLINICAL DECISION MAKING: Evolving/moderate complexity  EVALUATION COMPLEXITY: Moderate   GOALS: Goals reviewed with patient? Yes  SHORT TERM GOALS: Target date: 04/12/23 I with initial HEP updates Baseline: Goal status: INITIAL  LONG TERM GOALS: Target date: 06/08/23  I with final HEP Baseline:  Goal status: INITIAL  2.  Complete TUG in < 15 sec Baseline:  Goal status: INITIAL  3.  Complete 5 x STS in < 15 sec Baseline:  Goal status: INITIAL  4.  Patient will return to walking program st home, able to walk x at least 20 minutes on level/unlevel surfaces MI. Baseline: Has not been walking due to pain and unsteadiness. Goal status: INITIAL  5.  Patient will report improved pain in any of his joints or back to <3/10 Baseline:  Goal status: INITIAL  PLAN:  PT FREQUENCY: 1-2x/week  PT DURATION: 10 weeks  PLANNED INTERVENTIONS: Therapeutic exercises, Therapeutic activity, Neuromuscular re-education, Balance training, Gait training, Patient/Family education, Self Care, Joint mobilization, Stair training, Dry Needling, Electrical stimulation, Cryotherapy, Moist heat, Taping, Vasopneumatic device, Ionotophoresis 4mg /ml Dexamethasone, and Manual therapy  PLAN FOR NEXT SESSION: Complete TUG and 5 x STS, re-assess current HEP   Iona Beard, DPT 03/30/2023, 4:54 PM

## 2023-04-03 ENCOUNTER — Ambulatory Visit: Payer: Medicare Other | Admitting: Physical Therapy

## 2023-04-03 DIAGNOSIS — M6281 Muscle weakness (generalized): Secondary | ICD-10-CM | POA: Diagnosis not present

## 2023-04-03 DIAGNOSIS — D329 Benign neoplasm of meninges, unspecified: Secondary | ICD-10-CM

## 2023-04-03 DIAGNOSIS — R296 Repeated falls: Secondary | ICD-10-CM | POA: Diagnosis not present

## 2023-04-03 DIAGNOSIS — R531 Weakness: Secondary | ICD-10-CM | POA: Diagnosis not present

## 2023-04-03 DIAGNOSIS — R2681 Unsteadiness on feet: Secondary | ICD-10-CM | POA: Diagnosis not present

## 2023-04-03 DIAGNOSIS — R278 Other lack of coordination: Secondary | ICD-10-CM | POA: Diagnosis not present

## 2023-04-03 NOTE — Therapy (Signed)
OUTPATIENT PHYSICAL THERAPY LOWER EXTREMITY EVALUATION   Patient Name: Timothy Moran MRN: [id number] DOB:1946-03-03, 77 y.o., male Today's Date: 04/03/2023  END OF SESSION:  PT End of Session - 04/03/23 1102     Visit Number 2    Date for PT Re-Evaluation 06/08/23    PT Start Time 1100    PT Stop Time 1145    PT Time Calculation (min) 45 min    Activity Tolerance Patient tolerated treatment well    Behavior During Therapy [facility] for tasks assessed/performed             Past Medical History:  Diagnosis Date   Acute blood loss anemia 10/05/2020   Acute respiratory failure (HCC), hypothermia therapy, vent - extubated 10/06/20    AKI (acute kidney injury) (HCC) 10/05/2020   Anoxic brain injury (HCC) 10/26/2020   Arrhythmia    Ascending aortic aneurysm (HCC) 01/29/2018   a. 2019 4.3 cm by echo; b. 02/2021 Echo: Ao root 40mm.   Benign brain tumor (HCC)    Benign neoplasm of brain (HCC) 12/19/2017   Cardiac arrest with ventricular fibrillation (HCC)    Chest pain 12/04/2017   Chronic kidney disease, stage III (moderate) (HCC) 12/19/2017   CKD (chronic kidney disease), stage III - IV (HCC)    Conductive hearing loss of both ears 08/24/2018   Coronary Artery Disease    a. 09/2020 Inf STEMI complicated by cardiac arrest/PCI: LAD 50p/m, LCX 100d (2.5 x 30 Resolute Onyx DES); b. 04/2021 PCI: 04/2021 LM nl, LAD 50p/66m (2.5x30 Onyx Frontier DES), LCX 25p, 75m (RFR 0.98), 40d ISR (RFR 0.98), OM2 25, RCA mild diff dzs.   Diabetes mellitus, type 2 (HCC) 10/06/2020   10/06/20 A1C 7%   Dizziness 10/11/2019   Encounter for imaging study to confirm orogastric (OG) tube placement    Eustachian tube dysfunction 08/24/2018   Hematuria 10/26/2020   HFrEF (heart failure with reduced ejection fraction) (HCC)    a. 11/2020 Echo: EF 25-30%; b. 02/2021 Echo: EF 25-30%, glob HK. Mild LVH. Nl RV fxn. Triv AI. Ao root 40mm.   History of pulmonary embolus (PE)    HLD (hyperlipidemia) 10/26/2020    Hypertension    Iron deficiency anemia rec'd IV iron 10/26/2020   Ischemic cardiomyopathy    a. 11/2020 Echo: EF 25-30%; b. 02/2021 Echo: EF 25-30%, glob HK.   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 Marland KitchenSpirometry 03/08/2018    FEV1 3.86 (108%)  Ratio 96 s prior rx  - PET  03/13/18   Low grade c/w adenoca > rec T surgery eval     STEMI (ST elevation myocardial infarction) (HCC) 10/04/2020   Urinary retention 10/26/2020   Vestibular schwannoma (HCC) 08/14/2020   Past Surgical History:  Procedure Laterality Date   APPENDECTOMY     BIV ICD INSERTION  CRT-D N/A 11/03/2021   Procedure: BIV ICD INSERTION CRT-D;  Surgeon: Regan Lemming, MD;  Location: [facility] INVASIVE CV LAB;  Service: Cardiovascular;  Laterality: N/A;   CARDIAC CATHETERIZATION     CORONARY ANGIOPLASTY WITH STENT PLACEMENT Left 04/12/2021   LAD stent placement   CORONARY PRESSURE/FFR STUDY N/A 04/12/2021   Procedure: INTRAVASCULAR PRESSURE WIRE/FFR STUDY;  Surgeon: Yvonne Kendall, MD;  Location: [facility] INVASIVE CV LAB;  Service: Cardiovascular;  Laterality: N/A;   CORONARY STENT INTERVENTION N/A 04/12/2021   Procedure: CORONARY STENT INTERVENTION;  Surgeon: Yvonne Kendall, MD;  Location: [facility] INVASIVE CV LAB;  Service: Cardiovascular;  Laterality: N/A;   CORONARY ULTRASOUND/IVUS N/A 04/12/2021   Procedure:  Intravascular Ultrasound/IVUS;  Surgeon: Yvonne Kendall, MD;  Location: [facility] INVASIVE CV LAB;  Service: Cardiovascular;  Laterality: N/A;   CORONARY/GRAFT ACUTE MI REVASCULARIZATION N/A 10/04/2020   Procedure: Coronary/Graft Acute MI Revascularization;  Surgeon: Yvonne Kendall, MD;  Location: [facility] INVASIVE CV LAB;  Service: Cardiovascular;  Laterality: N/A;   HERNIA REPAIR     LEFT HEART CATH AND CORONARY ANGIOGRAPHY N/A 10/04/2020   Procedure: LEFT HEART CATH AND CORONARY ANGIOGRAPHY;  Surgeon: Yvonne Kendall, MD;  Location: [facility] INVASIVE CV LAB;  Service: Cardiovascular;  Laterality: N/A;   RIGHT/LEFT HEART CATH AND CORONARY ANGIOGRAPHY N/A 04/12/2021   Procedure: RIGHT/LEFT HEART CATH AND CORONARY ANGIOGRAPHY;  Surgeon: Yvonne Kendall, MD;  Location: [facility] INVASIVE CV LAB;  Service: Cardiovascular;  Laterality: N/A;   Patient Active Problem List   Diagnosis Date Noted   Anemia    Leukocytosis    Right arm weakness 01/25/2022   Right sided weakness    Fall at home 01/22/2022   Status post biventricular pacemaker - MRI compatible PPM and leads 01/22/2022   Chronic indwelling Foley catheter 01/22/2022   CKD (chronic kidney disease), stage III - IV (HCC) 06/28/2021   Chronic HFrEF (heart failure with reduced ejection fraction) (HCC) 03/31/2021   Arrhythmia    DM (diabetes mellitus), type 2 (HCC) new 10/26/2020   Urinary retention 10/26/2020   Hyperlipidemia LDL goal <70 10/26/2020   Coronary artery disease of native artery of native heart with stable angina pectoris (HCC) 10/26/2020   S/P angioplasty with stent 10/04/20 DES to LCX  10/26/2020   Ischemic cardiomyopathy 10/26/2020   Diabetes mellitus, type 2 (HCC) 10/06/2020   DOE (dyspnea on exertion)    Meningioma (HCC) 08/14/2020   Vestibular schwannoma (HCC) 08/14/2020   Cardiac arrest with ventricular fibrillation (HCC)    Pain of left hip joint 05/29/2020   Benign brain tumor (HCC)    Chronic kidney disease    History of pulmonary  embolus (PE)    Hypertension    Nonsustained ventricular tachycardia (HCC) 10/11/2019   Dizziness 10/11/2019   Abnormal echocardiogram 06/07/2019   Eustachian tube dysfunction 08/24/2018   Conductive hearing loss of both ears 08/24/2018   Pulmonary hypertension due to thromboembolism (HCC) 03/09/2018   Solitary pulmonary nodule on lung CT 03/08/2018   Ascending aortic aneurysm (HCC) 4.2 cm based on CT from December 2020 01/29/2018   Sinus bradycardia 12/22/2017   Left bundle branch block 12/19/2017   Personal history of pulmonary embolism - no longer on anticoagulation due to pt refusal to take it anymore. 12/19/2017   Chronic kidney disease, stage III (moderate) (HCC) 12/19/2017   Benign neoplasm of brain (HCC) 12/19/2017    PCP: Gweneth Dimitri, MD  REFERRING PROVIDER: Abram Sander DIAG:  [phone] (ICD-10-CM) - Left hip pain  M25.551 (ICD-10-CM) - Pain in  right hip  M54.2 (ICD-10-CM) - Cervicalgia    THERAPY DIAG:  Unsteadiness on feet  Right sided weakness  Meningioma (HCC)  Muscle weakness (generalized)  Rationale for Evaluation and Treatment: Rehabilitation  ONSET DATE: 03/20/23  SUBJECTIVE:   SUBJECTIVE STATEMENT: Patient had a fall yesterday. Feels okay but left side of trunk hurts.   PERTINENT HISTORY: Per previous therapy notes: left parafalcine meningioma. Frequent falls, R RC injury  PAIN:  Are you having pain? Yes: NPRS scale: 1-2/10 Pain location: R knee pain-distal/lat/ant to knee. He also reports B hip pain and back Pain description: sharp Aggravating factors: Hurts the most at night Relieving factors: Salon Pase  PRECAUTIONS: Fall  RED FLAGS: None   WEIGHT BEARING RESTRICTIONS: No  FALLS:  Has patient fallen in last 6 months? No  LIVING ENVIRONMENT: Lives with: lives with their family Lives in: House/apartment Stairs: Yes: External: 7 steps; bilateral but cannot reach both Has following equipment at home: Single point cane,  Walker - 2 wheeled, and shower chair  OCCUPATION: Retired  PLOF: Independent  PATIENT GOALS: Improve his ROM in his joints without pain.  NEXT MD VISIT: This afternoon  OBJECTIVE:   DIAGNOSTIC FINDINGS:  CT Lumbar- 3. Moderate degenerative spinal stenosis at L2-3. CT CERVICAL IMPRESSION: 1. No evidence of intracranial injury. 2. Negative for cervical spine fracture but there is prevertebral edema at C4-5 suggesting soft tissue injury. 3. Meningiomas with left cerebral of edema, reference brain MRI 01/22/2022.  COGNITION: Overall cognitive status: Within functional limits for tasks assessed     SENSATION: Patient reports numbness in entire R U and LE  EDEMA:  Patient denies  MUSCLE LENGTH: Hamstrings: B HS 70 Thomas test: B hip flexor tightness  POSTURE: rounded shoulders, forward head, decreased lumbar lordosis, and decreased thoracic kyphosis  PALPATION: TTP along B lumbar paraspinals and QL near iliac crests  CERVICAL ROM:  < 50% in all directions with muscle pull at end range  LUMBAR ROM: 50/70% in all directions with pain when sidebending to L and rotating to R.   LOWER EXTREMITY ROM: B hip ROM limited mildly in all planes, general stiffness in all joints, but WFL  LOWER EXTREMITY MMT: 4-(4-)/5, throughout, shaky with decreased control and clonic movements  LOWER EXTREMITY SPECIAL TESTS:  Hip special tests: Luisa Hart (FABER) test: positive  and Hip scouring test: negative Knee special tests: Patellafemoral apprehension test: positive  R knee started to sublux with minimal lateral press, causing pain   FUNCTIONAL TESTS:  5 times sit to stand: TBD Timed up and go (TUG): TBD  GAIT: Distance walked: In Clinic Distances Assistive device utilized: None Level of assistance: Complete Independence Comments: Patient appeared much more stable when entering. Rose from chair and walked back with no sway or unsteadiness. However, later in the assessment he experienced  unsteadiness when rising from chair.   TODAY'S TREATMENT:  DATE:   04/03/23 TUG 12 sec 5x STS 14.36 sec NuStep L5 x 6 mins DLS 10 sec DLS on airex 30 sec x3 Step ups onto airex in // bars CGA x10 each Step ups 4 in x10; 6 in x10; mod assist +2 Lateral step ups 6 in with R x5; mod assist +2; pt. had dizziness  03/30/23 Education   PATIENT EDUCATION:  Education details: POC Person educated: Patient Education method: Explanation Education comprehension: verbalized understanding  HOME EXERCISE PROGRAM: [id number]-from previous round of PT  ASSESSMENT:  CLINICAL IMPRESSION: Patients reported falling yesterday while doing yard work. Thinks he just lost his balance. Only has pain from the fall on the left side of his trunk. Has a visit with his MD Wednesday or Thursday for his knee, is going to get his trunk checked out as well. Said his HEP is going well and has been doing his exercises and stretches. Completed the TUG in 12 sec and 5x STS in 14.36 sec with no use of hands. Pt wanted to work on his balance and stability so this treatment consisted of balance with DLS, SLS, on even and uneven surfaces. Pt has hip pain only when twisting and bending, so treatment mostly consisted of upright positioning. He had LOB during all balance activities but recovered himself with the assistance of PTA and SPTA, pt was CGA and mod assist +2 to prevent falls during LOB. Verbal cues were needed during lateral step ups for correct foot placement and had c/o dizziness so pt sat until it subsided. Says he gets dizzy frequently possibly due to having recently corrected vision with glasses. He would benefit from continued PT to improve balance and coordination.  OBJECTIVE IMPAIRMENTS: Abnormal gait, decreased activity tolerance, decreased balance, decreased coordination, decreased mobility,  difficulty walking, decreased ROM, decreased strength, impaired flexibility, and pain.   ACTIVITY LIMITATIONS: carrying, lifting, bending, standing, squatting, stairs, transfers, and locomotion level  PARTICIPATION LIMITATIONS: meal prep, cleaning, laundry, shopping, and community activity  PERSONAL FACTORS: Past/current experiences and 1 comorbidity: Memingioma  are also affecting patient's functional outcome.   REHAB POTENTIAL: Good  CLINICAL DECISION MAKING: Evolving/moderate complexity  EVALUATION COMPLEXITY: Moderate   GOALS: Goals reviewed with patient? Yes  SHORT TERM GOALS: Target date: 04/12/23 I with initial HEP updates Baseline: Goal status: INITIAL  LONG TERM GOALS: Target date: 06/08/23  I with final HEP Baseline:  Goal status: INITIAL  2.  Complete TUG in < 15 sec Baseline:  Goal status: INITIAL  3.  Complete 5 x STS in < 15 sec Baseline:  Goal status: INITIAL  4.  Patient will return to walking program st home, able to walk x at least 20 minutes on level/unlevel surfaces MI. Baseline: Has not been walking due to pain and unsteadiness. Goal status: INITIAL  5.  Patient will report improved pain in any of his joints or back to <3/10 Baseline:  Goal status: INITIAL  PLAN:  PT FREQUENCY: 1-2x/week  PT DURATION: 10 weeks  PLANNED INTERVENTIONS: Therapeutic exercises, Therapeutic activity, Neuromuscular re-education, Balance training, Gait training, Patient/Family education, Self Care, Joint mobilization, Stair training, Dry Needling, Electrical stimulation, Cryotherapy, Moist heat, Taping, Vasopneumatic device, Ionotophoresis 4mg /ml Dexamethasone, and Manual therapy  PLAN FOR NEXT SESSION: continue with balance activities     George Ina, SPTA 04/03/2023, 11:03 AM

## 2023-04-04 DIAGNOSIS — M1711 Unilateral primary osteoarthritis, right knee: Secondary | ICD-10-CM | POA: Diagnosis not present

## 2023-04-10 ENCOUNTER — Ambulatory Visit: Payer: Medicare Other | Admitting: Physical Therapy

## 2023-04-10 ENCOUNTER — Encounter: Payer: Self-pay | Admitting: Physical Therapy

## 2023-04-10 DIAGNOSIS — D329 Benign neoplasm of meninges, unspecified: Secondary | ICD-10-CM | POA: Diagnosis not present

## 2023-04-10 DIAGNOSIS — R531 Weakness: Secondary | ICD-10-CM

## 2023-04-10 DIAGNOSIS — R2681 Unsteadiness on feet: Secondary | ICD-10-CM

## 2023-04-10 DIAGNOSIS — R278 Other lack of coordination: Secondary | ICD-10-CM | POA: Diagnosis not present

## 2023-04-10 DIAGNOSIS — M6281 Muscle weakness (generalized): Secondary | ICD-10-CM | POA: Diagnosis not present

## 2023-04-10 DIAGNOSIS — R296 Repeated falls: Secondary | ICD-10-CM

## 2023-04-10 NOTE — Therapy (Signed)
OUTPATIENT PHYSICAL THERAPY LOWER EXTREMITY EVALUATION   Patient Name: Timothy Moran MRN: [id number] DOB:05-Mar-1946, 77 y.o., male Today's Date: 04/10/2023  END OF SESSION:  PT End of Session - 04/10/23 1140     Visit Number 3    Date for PT Re-Evaluation 06/08/23    Activity Tolerance Patient tolerated treatment well    Behavior During Therapy [facility] for tasks assessed/performed             Past Medical History:  Diagnosis Date   Acute blood loss anemia 10/05/2020   Acute respiratory failure (HCC), hypothermia therapy, vent - extubated 10/06/20    AKI (acute kidney injury) (HCC) 10/05/2020   Anoxic brain injury (HCC) 10/26/2020   Arrhythmia    Ascending aortic aneurysm (HCC) 01/29/2018   a. 2019 4.3 cm by echo; b. 02/2021 Echo: Ao root 40mm.   Benign brain tumor (HCC)    Benign neoplasm of brain (HCC) 12/19/2017   Cardiac arrest with ventricular fibrillation (HCC)    Chest pain 12/04/2017   Chronic kidney disease, stage III (moderate) (HCC) 12/19/2017   CKD (chronic kidney disease), stage III - IV (HCC)    Conductive hearing loss of both ears 08/24/2018   Coronary Artery Disease    a. 09/2020 Inf STEMI complicated by cardiac arrest/PCI: LAD 50p/m, LCX 100d (2.5 x 30 Resolute Onyx DES); b. 04/2021 PCI: 04/2021 LM nl, LAD 50p/75m (2.5x30 Onyx Frontier DES), LCX 25p, 46m (RFR 0.98), 40d ISR (RFR 0.98), OM2 25, RCA mild diff dzs.   Diabetes mellitus, type 2 (HCC) 10/06/2020   10/06/20 A1C 7%   Dizziness 10/11/2019   Encounter for imaging study to confirm orogastric (OG) tube placement    Eustachian tube dysfunction 08/24/2018   Hematuria 10/26/2020   HFrEF (heart failure with reduced ejection fraction) (HCC)    a. 11/2020 Echo: EF 25-30%; b. 02/2021 Echo: EF 25-30%, glob HK. Mild LVH. Nl RV fxn. Triv AI. Ao root 40mm.   History of pulmonary embolus (PE)    HLD (hyperlipidemia) 10/26/2020   Hypertension    Iron deficiency anemia rec'd IV iron 10/26/2020   Ischemic  cardiomyopathy    a. 11/2020 Echo: EF 25-30%; b. 02/2021 Echo: EF 25-30%, glob HK.   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 Marland KitchenSpirometry 03/08/2018    FEV1 3.86 (108%)  Ratio 96 s prior rx  - PET  03/13/18   Low grade c/w adenoca > rec T surgery eval     STEMI (ST elevation myocardial infarction) (HCC) 10/04/2020   Urinary retention 10/26/2020   Vestibular schwannoma (HCC) 08/14/2020   Past Surgical History:  Procedure Laterality Date   APPENDECTOMY     BIV ICD INSERTION CRT-D N/A 11/03/2021   Procedure: BIV ICD INSERTION CRT-D;  Surgeon: Regan Lemming, MD;  Location: [facility] INVASIVE CV LAB;  Service: Cardiovascular;  Laterality: N/A;   CARDIAC CATHETERIZATION     CORONARY ANGIOPLASTY WITH STENT PLACEMENT Left 04/12/2021   LAD stent placement   CORONARY PRESSURE/FFR STUDY N/A 04/12/2021   Procedure: INTRAVASCULAR PRESSURE WIRE/FFR STUDY;  Surgeon: Yvonne Kendall, MD;  Location: [facility] INVASIVE CV LAB;  Service: Cardiovascular;  Laterality: N/A;   CORONARY STENT INTERVENTION N/A 04/12/2021   Procedure: CORONARY STENT INTERVENTION;  Surgeon: Yvonne Kendall, MD;  Location: [facility] INVASIVE CV LAB;  Service: Cardiovascular;  Laterality: N/A;   CORONARY ULTRASOUND/IVUS N/A 04/12/2021   Procedure: Intravascular Ultrasound/IVUS;  Surgeon: Yvonne Kendall, MD;  Location: [facility] INVASIVE  CV LAB;  Service: Cardiovascular;  Laterality: N/A;   CORONARY/GRAFT ACUTE MI REVASCULARIZATION N/A 10/04/2020   Procedure: Coronary/Graft Acute MI Revascularization;  Surgeon: Yvonne Kendall, MD;  Location: [facility] INVASIVE CV LAB;  Service: Cardiovascular;  Laterality: N/A;   HERNIA REPAIR     LEFT HEART CATH AND CORONARY ANGIOGRAPHY N/A 10/04/2020   Procedure: LEFT HEART CATH AND CORONARY ANGIOGRAPHY;  Surgeon: Yvonne Kendall, MD;  Location: [facility] INVASIVE CV LAB;  Service: Cardiovascular;  Laterality: N/A;   RIGHT/LEFT HEART CATH AND CORONARY ANGIOGRAPHY N/A 04/12/2021   Procedure: RIGHT/LEFT HEART CATH AND CORONARY ANGIOGRAPHY;  Surgeon: Yvonne Kendall, MD;  Location: [facility] INVASIVE CV LAB;  Service: Cardiovascular;  Laterality: N/A;   Patient Active Problem List   Diagnosis Date Noted   Anemia    Leukocytosis    Right arm weakness 01/25/2022   Right sided weakness    Fall at home 01/22/2022   Status post biventricular pacemaker - MRI compatible PPM and leads 01/22/2022   Chronic indwelling Foley catheter 01/22/2022   CKD (chronic kidney disease), stage III - IV (HCC) 06/28/2021   Chronic HFrEF (heart failure with reduced ejection fraction) (HCC) 03/31/2021   Arrhythmia    DM (diabetes mellitus), type 2 (HCC) new 10/26/2020   Urinary retention 10/26/2020   Hyperlipidemia LDL goal <70 10/26/2020   Coronary artery disease of native artery of native heart with stable angina pectoris (HCC) 10/26/2020   S/P angioplasty with stent 10/04/20 DES to LCX  10/26/2020   Ischemic cardiomyopathy 10/26/2020   Diabetes mellitus, type 2 (HCC) 10/06/2020   DOE (dyspnea on exertion)    Meningioma (HCC) 08/14/2020   Vestibular schwannoma (HCC) 08/14/2020   Cardiac arrest with ventricular fibrillation (HCC)    Pain of left hip joint 05/29/2020   Benign brain tumor (HCC)    Chronic kidney disease    History of pulmonary embolus (PE)    Hypertension    Nonsustained ventricular tachycardia (HCC) 10/11/2019    Dizziness 10/11/2019   Abnormal echocardiogram 06/07/2019   Eustachian tube dysfunction 08/24/2018   Conductive hearing loss of both ears 08/24/2018   Pulmonary hypertension due to thromboembolism (HCC) 03/09/2018   Solitary pulmonary nodule on lung CT 03/08/2018   Ascending aortic aneurysm (HCC) 4.2 cm based on CT from December 2020 01/29/2018   Sinus bradycardia 12/22/2017   Left bundle branch block 12/19/2017   Personal history of pulmonary embolism - no longer on anticoagulation due to pt refusal to take it anymore. 12/19/2017   Chronic kidney disease, stage III (moderate) (HCC) 12/19/2017   Benign neoplasm of brain (HCC) 12/19/2017    PCP: Gweneth Dimitri, MD  REFERRING PROVIDER: Abram Sander DIAG:  [phone] (ICD-10-CM) - Left hip pain  M25.551 (ICD-10-CM) - Pain in right hip  M54.2 (ICD-10-CM) - Cervicalgia    THERAPY DIAG:  Unsteadiness on feet  Right sided weakness  Meningioma (HCC)  Muscle weakness (generalized)  Repeated falls  Rationale for Evaluation and Treatment: Rehabilitation  ONSET DATE: 03/20/23  SUBJECTIVE:   SUBJECTIVE STATEMENT: "Good"   PERTINENT HISTORY: Per previous therapy notes: left parafalcine meningioma. Frequent falls, R RC injury  PAIN:  Are you having pain? Yes: NPRS scale: 0/10 Pain location: R knee pain-distal/lat/ant to knee. He also reports B hip pain and back Pain description: sharp Aggravating factors: Hurts the most at night Relieving factors: Salon Pase  PRECAUTIONS: Fall  RED FLAGS: None   WEIGHT BEARING RESTRICTIONS: No  FALLS:  Has patient fallen in last 6 months? No  LIVING ENVIRONMENT: Lives with: lives with their family Lives in: House/apartment Stairs: Yes: External: 7 steps; bilateral but cannot reach both Has following equipment at home: Single point cane, Walker - 2 wheeled, and shower chair  OCCUPATION: Retired  PLOF: Independent  PATIENT GOALS: Improve his ROM in his joints without  pain.  NEXT MD VISIT: This afternoon  OBJECTIVE:   DIAGNOSTIC FINDINGS:  CT Lumbar- 3. Moderate degenerative spinal stenosis at L2-3. CT CERVICAL IMPRESSION: 1. No evidence of intracranial injury. 2. Negative for cervical spine fracture but there is prevertebral edema at C4-5 suggesting soft tissue injury. 3. Meningiomas with left cerebral of edema, reference brain MRI 01/22/2022.  COGNITION: Overall cognitive status: Within functional limits for tasks assessed     SENSATION: Patient reports numbness in entire R U and LE  EDEMA:  Patient denies  MUSCLE LENGTH: Hamstrings: B HS 70 Thomas test: B hip flexor tightness  POSTURE: rounded shoulders, forward head, decreased lumbar lordosis, and decreased thoracic kyphosis  PALPATION: TTP along B lumbar paraspinals and QL near iliac crests  CERVICAL ROM:  < 50% in all directions with muscle pull at end range  LUMBAR ROM: 50/70% in all directions with pain when sidebending to L and rotating to R.   LOWER EXTREMITY ROM: B hip ROM limited mildly in all planes, general stiffness in all joints, but WFL  LOWER EXTREMITY MMT: 4-(4-)/5, throughout, shaky with decreased control and clonic movements  LOWER EXTREMITY SPECIAL TESTS:  Hip special tests: Luisa Hart (FABER) test: positive  and Hip scouring test: negative Knee special tests: Patellafemoral apprehension test: positive  R knee started to sublux with minimal lateral press, causing pain   FUNCTIONAL TESTS:  5 times sit to stand: TBD Timed up and go (TUG): TBD  GAIT: Distance walked: In Clinic Distances Assistive device utilized: None Level of assistance: Complete Independence Comments: Patient appeared much more stable when entering. Rose from chair and walked back with no sway or unsteadiness. However, later in the assessment he experienced unsteadiness when rising from chair.   TODAY'S TREATMENT:                                                                                                                               DATE:  04/10/23 Bike L3.5 x 7 min Resisted gait 30lb 4 way x 3 each HS  curls 35lb 2x15 Leg Ext 10lb 2x15 S2S elevated mat 2x5, then LE's on airex 2x5 ALT 6in box taps x10, then fro airex x10  Rows and Ext green on airex 2x10 each   04/03/23 TUG 12 sec 5x STS 14.36 sec NuStep L5 x 6 mins DLS 10 sec DLS on airex 30 sec x3 Step ups onto airex in // bars CGA x10 each Step ups 4 in x10; 6 in x10; mod assist +2 Lateral step ups 6 in with R x5; mod assist +2; pt. had dizziness  03/30/23 Education   PATIENT EDUCATION:  Education details: POC Person educated: Patient Education method: Explanation Education comprehension: verbalized understanding  HOME EXERCISE PROGRAM: [id number]-from previous round of PT  ASSESSMENT:  CLINICAL IMPRESSION: Pt enters doing well. He seems to be unstable at times with his gait. Session consisted of balance and LE strengthening. Inconsistent step length noted with resisted gait. Instability present with airex sit to stands and alternating box taps. All interventions completed well with some instability. He would benefit from continued PT to improve balance and coordination.  OBJECTIVE IMPAIRMENTS: Abnormal gait, decreased activity tolerance, decreased balance, decreased coordination, decreased mobility, difficulty walking, decreased ROM, decreased strength, impaired flexibility, and pain.   ACTIVITY LIMITATIONS: carrying, lifting, bending, standing, squatting, stairs, transfers, and locomotion level  PARTICIPATION LIMITATIONS: meal prep, cleaning, laundry, shopping, and community activity  PERSONAL FACTORS: Past/current experiences and 1 comorbidity: Memingioma  are also affecting patient's functional outcome.   REHAB POTENTIAL: Good  CLINICAL DECISION MAKING: Evolving/moderate complexity  EVALUATION COMPLEXITY: Moderate   GOALS: Goals reviewed with patient? Yes  SHORT TERM GOALS: Target date:  04/12/23 I with initial HEP updates Baseline: Goal status: INITIAL  LONG TERM GOALS: Target date: 06/08/23  I with final HEP Baseline:  Goal status: INITIAL  2.  Complete TUG in < 15 sec Baseline:  Goal status: Met 04/03/23  3.  Complete 5 x STS in < 15 sec Baseline:  /Goal status: Met 04/03/23  4.  Pati/ent will return to walking program st home, able to walk x at least 20 minutes on level/unlevel surfaces MI. Baseline: Has not been walking due to pain and unsteadiness. Goal status: INITIAL  5.  Patient will report improved pain in any of his joints or back to <3/10 Baseline:  Goal status: INITIAL  PLAN:  PT FREQUENCY: 1-2x/week  PT DURATION: 10 weeks  PLANNED INTERVENTIONS: Therapeutic exercises, Therapeutic activity, Neuromuscular re-education, Balance training, Gait training, Patient/Family education, Self Care, Joint mobilization, Stair training, Dry Needling, Electrical stimulation, Cryotherapy, Moist heat, Taping, Vasopneumatic device, Ionotophoresis 4mg /ml Dexamethasone, and Manual therapy  PLAN FOR NEXT SESSION: continue with balance activities     Grayce Sessions, PTA, SPTA 04/10/2023, 11:41 AM

## 2023-04-12 ENCOUNTER — Emergency Department (HOSPITAL_BASED_OUTPATIENT_CLINIC_OR_DEPARTMENT_OTHER): Payer: Medicare Other

## 2023-04-12 ENCOUNTER — Ambulatory Visit: Payer: Medicare Other | Admitting: Physical Therapy

## 2023-04-12 ENCOUNTER — Encounter: Payer: Self-pay | Admitting: Physical Therapy

## 2023-04-12 ENCOUNTER — Emergency Department (HOSPITAL_BASED_OUTPATIENT_CLINIC_OR_DEPARTMENT_OTHER)
Admission: EM | Admit: 2023-04-12 | Discharge: 2023-04-12 | Disposition: A | Discharge status: Home / Self Care | Payer: Medicare Other | Attending: Emergency Medicine | Admitting: Emergency Medicine

## 2023-04-12 ENCOUNTER — Encounter (HOSPITAL_BASED_OUTPATIENT_CLINIC_OR_DEPARTMENT_OTHER): Payer: Self-pay | Admitting: Emergency Medicine

## 2023-04-12 DIAGNOSIS — M6281 Muscle weakness (generalized): Secondary | ICD-10-CM | POA: Diagnosis not present

## 2023-04-12 DIAGNOSIS — D329 Benign neoplasm of meninges, unspecified: Secondary | ICD-10-CM | POA: Diagnosis not present

## 2023-04-12 DIAGNOSIS — Z86011 Personal history of benign neoplasm of the brain: Secondary | ICD-10-CM | POA: Diagnosis not present

## 2023-04-12 DIAGNOSIS — I517 Cardiomegaly: Secondary | ICD-10-CM | POA: Diagnosis not present

## 2023-04-12 DIAGNOSIS — I509 Heart failure, unspecified: Secondary | ICD-10-CM | POA: Insufficient documentation

## 2023-04-12 DIAGNOSIS — R0789 Other chest pain: Secondary | ICD-10-CM | POA: Diagnosis not present

## 2023-04-12 DIAGNOSIS — S2241XA Multiple fractures of ribs, right side, initial encounter for closed fracture: Secondary | ICD-10-CM | POA: Diagnosis not present

## 2023-04-12 DIAGNOSIS — R531 Weakness: Secondary | ICD-10-CM | POA: Insufficient documentation

## 2023-04-12 DIAGNOSIS — R278 Other lack of coordination: Secondary | ICD-10-CM | POA: Diagnosis not present

## 2023-04-12 DIAGNOSIS — R202 Paresthesia of skin: Secondary | ICD-10-CM | POA: Insufficient documentation

## 2023-04-12 DIAGNOSIS — Z85841 Personal history of malignant neoplasm of brain: Secondary | ICD-10-CM | POA: Insufficient documentation

## 2023-04-12 DIAGNOSIS — S299XXA Unspecified injury of thorax, initial encounter: Secondary | ICD-10-CM | POA: Diagnosis present

## 2023-04-12 DIAGNOSIS — R2681 Unsteadiness on feet: Secondary | ICD-10-CM | POA: Diagnosis not present

## 2023-04-12 DIAGNOSIS — Z7982 Long term (current) use of aspirin: Secondary | ICD-10-CM | POA: Diagnosis not present

## 2023-04-12 DIAGNOSIS — S2249XA Multiple fractures of ribs, unspecified side, initial encounter for closed fracture: Secondary | ICD-10-CM

## 2023-04-12 DIAGNOSIS — I2510 Atherosclerotic heart disease of native coronary artery without angina pectoris: Secondary | ICD-10-CM | POA: Diagnosis not present

## 2023-04-12 DIAGNOSIS — N184 Chronic kidney disease, stage 4 (severe): Secondary | ICD-10-CM | POA: Insufficient documentation

## 2023-04-12 DIAGNOSIS — Y9301 Activity, walking, marching and hiking: Secondary | ICD-10-CM | POA: Diagnosis not present

## 2023-04-12 DIAGNOSIS — I130 Hypertensive heart and chronic kidney disease with heart failure and stage 1 through stage 4 chronic kidney disease, or unspecified chronic kidney disease: Secondary | ICD-10-CM | POA: Insufficient documentation

## 2023-04-12 DIAGNOSIS — S2242XA Multiple fractures of ribs, left side, initial encounter for closed fracture: Secondary | ICD-10-CM | POA: Diagnosis not present

## 2023-04-12 DIAGNOSIS — R918 Other nonspecific abnormal finding of lung field: Secondary | ICD-10-CM | POA: Diagnosis not present

## 2023-04-12 DIAGNOSIS — W108XXA Fall (on) (from) other stairs and steps, initial encounter: Secondary | ICD-10-CM | POA: Insufficient documentation

## 2023-04-12 DIAGNOSIS — E119 Type 2 diabetes mellitus without complications: Secondary | ICD-10-CM | POA: Insufficient documentation

## 2023-04-12 DIAGNOSIS — W19XXXA Unspecified fall, initial encounter: Secondary | ICD-10-CM

## 2023-04-12 DIAGNOSIS — R079 Chest pain, unspecified: Secondary | ICD-10-CM | POA: Diagnosis not present

## 2023-04-12 DIAGNOSIS — R296 Repeated falls: Secondary | ICD-10-CM | POA: Diagnosis not present

## 2023-04-12 LAB — CBC WITH DIFFERENTIAL/PLATELET
Abs Immature Granulocytes: 0.06 10*3/uL (ref 0.00–0.07)
Basophils Absolute: 0 10*3/uL (ref 0.0–0.1)
Basophils Relative: 0 %
Eosinophils Absolute: 0.2 10*3/uL (ref 0.0–0.5)
Eosinophils Relative: 2 %
HCT: 39.7 % (ref 39.0–52.0)
Hemoglobin: 13.1 g/dL (ref 13.0–17.0)
Immature Granulocytes: 1 %
Lymphocytes Relative: 25 %
Lymphs Abs: 2.5 10*3/uL (ref 0.7–4.0)
MCH: 30.4 pg (ref 26.0–34.0)
MCHC: 33 g/dL (ref 30.0–36.0)
MCV: 92.1 fL (ref 80.0–100.0)
Monocytes Absolute: 0.7 10*3/uL (ref 0.1–1.0)
Monocytes Relative: 7 %
Neutro Abs: 6.4 10*3/uL (ref 1.7–7.7)
Neutrophils Relative %: 65 %
Platelets: 265 10*3/uL (ref 150–400)
RBC: 4.31 MIL/uL (ref 4.22–5.81)
RDW: 14.3 % (ref 11.5–15.5)
WBC: 9.9 10*3/uL (ref 4.0–10.5)
nRBC: 0 % (ref 0.0–0.2)

## 2023-04-12 LAB — BASIC METABOLIC PANEL
Anion gap: 9 (ref 5–15)
BUN: 34 mg/dL — ABNORMAL HIGH (ref 8–23)
CO2: 21 mmol/L — ABNORMAL LOW (ref 22–32)
Calcium: 8.7 mg/dL — ABNORMAL LOW (ref 8.9–10.3)
Chloride: 108 mmol/L (ref 98–111)
Creatinine, Ser: 2.41 mg/dL — ABNORMAL HIGH (ref 0.61–1.24)
GFR, Estimated: 27 mL/min — ABNORMAL LOW (ref >60)
Glucose, Bld: 109 mg/dL — ABNORMAL HIGH (ref 70–99)
Potassium: 4.8 mmol/L (ref 3.5–5.1)
Sodium: 138 mmol/L (ref 135–145)

## 2023-04-12 LAB — TROPONIN I (HIGH SENSITIVITY): Troponin I (High Sensitivity): 12 ng/L (ref <18)

## 2023-04-12 NOTE — ED Provider Notes (Signed)
Timothy Moran   CSN: [id number] Arrival date & time: 04/12/23  1230     History  Chief Complaint  Patient presents with   Fall   Chest Pain    Timothy Moran is a 77 y.o. male.  HPI    77yo male with history of ascending aortic aneurysm, history of cardiac arrest with vfib, CAD, DM type II, htn, hlpd, PE,  ischemic cardiomyopathy with defibrillator in place, who presents with left chest wall pain after fall one week ago and fall yesterday.   1 week ago working in the yard, leaning over with Radio broadcast assistant in hand and lost balance, thinks might have hit chest with sledge hammer, walked back into the house. Has had chest pain since then, thinks the hammer hit chest, was more mild, notes only present with moving certain ways, laying down.  Felt the pain was ok but worsened yesterday after he fell  Yesterday, slipped on step as walking down carrying a box, missed step slipped, fell backwards onto the step, chest pain has been worse since then.   About 1.5 years ago fell, spent time in hospital for neck, arm injuries-ever since then has had weakness on right side has been numb since then, has had dizziness and numbness since then, right hip hurting since then, able to ambulate on it without significant pain.    If push on left rib it hurts, if lean back it hurts as pain was so severe, suggested call Dr. Sharen Hones does not feel like prior MI, is just present with movements, palpation Did not hit head or have LOC, no new neck or back pain, no new numbness No lightheadedness, no passing out nor dizziness No cough or shortness of breath. No fever When not doing anything pain is zero, with twisting is 4-5 sometimes higher, worse with movements   Past Medical History:  Diagnosis Date   Acute blood loss anemia 10/05/2020   Acute respiratory failure (HCC), hypothermia therapy, vent - extubated 10/06/20    AKI (acute kidney injury) (HCC)  10/05/2020   Anoxic brain injury (HCC) 10/26/2020   Arrhythmia    Ascending aortic aneurysm (HCC) 01/29/2018   a. 2019 4.3 cm by echo; b. 02/2021 Echo: Ao root 40mm.   Benign brain tumor (HCC)    Benign neoplasm of brain (HCC) 12/19/2017   Cardiac arrest with ventricular fibrillation (HCC)    Chest pain 12/04/2017   Chronic kidney disease, stage III (moderate) (HCC) 12/19/2017   CKD (chronic kidney disease), stage III - IV (HCC)    Conductive hearing loss of both ears 08/24/2018   Coronary Artery Disease    a. 09/2020 Inf STEMI complicated by cardiac arrest/PCI: LAD 50p/m, LCX 100d (2.5 x 30 Resolute Onyx DES); b. 04/2021 PCI: 04/2021 LM nl, LAD 50p/28m (2.5x30 Onyx Frontier DES), LCX 25p, 69m (RFR 0.98), 40d ISR (RFR 0.98), OM2 25, RCA mild diff dzs.   Diabetes mellitus, type 2 (HCC) 10/06/2020   10/06/20 A1C 7%   Dizziness 10/11/2019   Encounter for imaging study to confirm orogastric (OG) tube placement    Eustachian tube dysfunction 08/24/2018   Hematuria 10/26/2020   HFrEF (heart failure with reduced ejection fraction) (HCC)    a. 11/2020 Echo: EF 25-30%; b. 02/2021 Echo: EF 25-30%, glob HK. Mild LVH. Nl RV fxn. Triv AI. Ao root 40mm.   History of pulmonary embolus (PE)    HLD (hyperlipidemia) 10/26/2020   Hypertension    Iron deficiency  anemia rec'd IV iron 10/26/2020   Ischemic cardiomyopathy    a. 11/2020 Echo: EF 25-30%; b. 02/2021 Echo: EF 25-30%, glob HK.   Left bundle branch block 12/19/2017   Meningioma (HCC) 08/14/2020   Nonsustained ventricular tachycardia (HCC) 10/11/2019   Pain of left hip joint 05/29/2020   Personal history of pulmonary embolism 12/19/2017   Pneumonia of both lungs due to infectious organism    Pulmonary hypertension due to thromboembolism (HCC) 03/09/2018   See echo 12/27/17 with nl PAS vs CTa chest 02/23/18    S/P angioplasty with stent 10/04/20 DES to LCX  10/26/2020   Sinus bradycardia 12/22/2017   SOB (shortness of breath)    Solitary pulmonary  nodule on lung CT 03/08/2018   CT 12/04/17 1.0 x 0.8 x 0.7 cm nodular opacity in the RUL vs not seen 03/16/10 posterior segment of the right upper lobe. Marland KitchenSpirometry 03/08/2018    FEV1 3.86 (108%)  Ratio 96 s prior rx  - PET  03/13/18   Low grade c/w adenoca > rec T surgery eval     STEMI (ST elevation myocardial infarction) (HCC) 10/04/2020   Urinary retention 10/26/2020   Vestibular schwannoma (HCC) 08/14/2020     Home Medications Prior to Admission medications   Medication Sig Start Date End Date Taking? Authorizing Provider  acetaminophen (TYLENOL) 325 MG tablet Take 1-2 tablets (325-650 mg total) by mouth every 4 (four) hours as needed for mild pain. 02/02/22   Setzer, Lynnell Jude, PA-C  amiodarone (PACERONE) 200 MG tablet Take 0.5 tablets (100 mg total) by mouth daily. 03/14/23   Camnitz, Andree Coss, MD  aspirin EC 81 MG tablet Take 81 mg by mouth every other day. Swallow whole.    [provider]  atorvastatin (LIPITOR) 80 MG tablet Take 1 tablet (80 mg total) by mouth daily. 12/15/22   Georgeanna Lea, MD  B Complex-C (SUPER B COMPLEX PO) Take 1 tablet by mouth daily. Unknown strength    [provider]  diclofenac Sodium (VOLTAREN) 1 % GEL Apply 4 g topically 4 (four) times daily. 05/01/22   Melene Plan, DO  fluticasone (FLONASE) 50 MCG/ACT nasal spray Place 1-2 sprays into both nostrils at bedtime. 02/02/22   Setzer, Lynnell Jude, PA-C  isosorbide dinitrate (ISORDIL) 30 MG tablet Take 1 tablet (30 mg total) by mouth 2 (two) times daily. 09/06/22   Georgeanna Lea, MD  melatonin 5 MG TABS Take 1 tablet (5 mg total) by mouth at bedtime. 11/06/20   Love, Evlyn Kanner, PA-C  Multiple Vitamin (MULTIVITAMIN WITH MINERALS) TABS tablet Take 1 tablet by mouth daily. 11/07/20   Love, Evlyn Kanner, PA-C  senna-docusate (SENOKOT-S) 8.6-50 MG tablet Take 1 tablet by mouth at bedtime as needed for mild constipation. 02/02/22   Setzer, Lynnell Jude, PA-C  sodium bicarbonate 650 MG tablet Take 650 mg by mouth 2  (two) times daily. 06/07/22   [provider]      Allergies    Hydralazine and Mexiletine    Review of Systems   Review of Systems  Physical Exam Updated Vital Signs BP 129/72 (BP Location: Right Arm)   Pulse 81   Temp 97.8 F (36.6 C)   Resp 20   Ht 6\' 1"  (1.854 m)   Wt 99.8 kg   SpO2 96%   BMI 29.03 kg/m  Physical Exam Vitals and nursing Moran reviewed.  Constitutional:      General: He is not in acute distress.    Appearance: He is  well-developed. He is not diaphoretic.  HENT:     Head: Normocephalic and atraumatic.  Eyes:     Conjunctiva/sclera: Conjunctivae normal.  Cardiovascular:     Rate and Rhythm: Normal rate and regular rhythm.     Heart sounds: Normal heart sounds. No murmur heard.    No friction rub. No gallop.  Pulmonary:     Effort: Pulmonary effort is normal. No respiratory distress.     Breath sounds: Normal breath sounds. No wheezing or rales.  Chest:     Chest wall: Tenderness (left lateral ribs) present.  Abdominal:     General: There is no distension.     Palpations: Abdomen is soft.     Tenderness: There is no abdominal tenderness. There is no guarding.  Musculoskeletal:     Cervical back: Normal range of motion.     Comments: No tenderness to c/t/l spine  Skin:    General: Skin is warm and dry.  Neurological:     Mental Status: He is alert and oriented to person, place, and time.     ED Results / Procedures / Treatments   Labs (all labs ordered are listed, but only abnormal results are displayed) Labs Reviewed  BASIC METABOLIC PANEL - Abnormal; Notable for the following components:      Result Value   CO2 21 (*)    Glucose, Bld 109 (*)    BUN 34 (*)    Creatinine, Ser 2.41 (*)    Calcium 8.7 (*)    GFR, Estimated 27 (*)    All other components within normal limits  CBC WITH DIFFERENTIAL/PLATELET  TROPONIN I (HIGH SENSITIVITY)  TROPONIN I (HIGH SENSITIVITY)    EKG EKG Interpretation Date/Time:  Wednesday April 12 2023 12:39:11 EDT Ventricular Rate:  74 PR Interval:  185 QRS Duration:  146 QT Interval:  436 QTC Calculation: 484 R Axis:   160  Text Interpretation: Sinus rhythm Multiple ventricular premature complexes Nonspecific intraventricular conduction delay Abnormal lateral Q waves Abnormal T, consider ischemia, lateral leads TW inversions new since prior Confirmed by Alvira Monday ([id number]) on 04/12/2023 1:31:44 PM  Radiology DG Chest 2 View  Result Date: 04/12/2023 CLINICAL DATA:  cp EXAM: CHEST - 2 VIEW COMPARISON:  CXR 01/21/22 FINDINGS: Cardiomegaly. Small right pleural effusion, new/increased from prior exam. Left-sided triple lead cardiac device in place with unchanged lead positioning. No pneumothorax. Hazy opacity in the right lung base likely represent superimposed atelectasis. There are likely mildly displaced fractures lateral right ribs 7 and 8. Degenerative changes of the bilateral glenohumeral and AC joints. IMPRESSION: Likely mildly displaced fractures of lateral right ribs 7 and 8 with a new small right sided pleural effusion, which could represent small volume pleural blood products. No pneumothorax Electronically Signed   By: Lorenza Cambridge M.D.   On: 04/12/2023 13:56    Procedures Procedures    Medications Ordered in ED Medications - No data to display  ED Course/ Medical Decision Making/ A&P                                  77yo male with history of ascending aortic aneurysm, history of cardiac arrest with vfib, CAD, DM type II, htn, hlpd, PE,  ischemic cardiomyopathy with defibrillator in place, who presents with left chest wall pain after fall one week ago and fall yesterday.   Fall last week and yesterday both mechanical by history, denies lightheadedness, syncope.  No history of head trauma, no headache, neck pain, midline back pain.  EKG completed and personally evaluated by me shows inferior TW changes.  Troponin negative at12 (prior [id number]).  Labs completed and  personally evaluated by me shows mild increased Cr from prior, no clinically significant electrolyte abnormalities, no anemia, no leukocytosis.  Do not suspect aortic dissection in setting of normal bilateral pulses, do not suspect ACS with negative troponin and history and exam consistent with rib injury.    CXR obtained showing right sided rib fx 8 and 9 and effusion.  He is not having right sided rib pain (reports chronic numbness to right since prior trauma, no acute changes). Effusion present but clinically less clear if this is secondary to trauma/hemothorax given no right rib pain.  Do suspect occult rib fracture on left versus possible muscular strain.   Given trauma happened initially one week ago, is hemodynamically stable, no dyspnea, doing well with incentive spirometer, is reliable, do feel he is stable for outpatient follow up with strict return precautions and do not feel CT will change course of care at this time. Discussed with Surgery and will have him follow up with them in one week.  He declines pain medicatinos, reports he feels better with OTC lidocaine patchy. Discussed recommendation to avoid NSAIDs, take tylenol and lidocaine patch, continued IS 10/hr, strict return precautions. Patient discharged in stable condition with understanding of reasons to return.          Final Clinical Impression(s) / ED Diagnoses Final diagnoses:  Fall, initial encounter  Chest wall pain  Closed fracture of multiple ribs, unspecified laterality, initial encounter    Rx / DC Orders ED Discharge Orders     None         Alvira Monday, MD 04/13/23 0000

## 2023-04-12 NOTE — Therapy (Signed)
OUTPATIENT PHYSICAL THERAPY LOWER EXTREMITY EVALUATION   Patient Name: Timothy Moran MRN: [id number] DOB:1946/04/05, 77 y.o., male Today's Date: 04/12/2023  END OF SESSION:  PT End of Session - 04/12/23 1110     Visit Number 4    Date for PT Re-Evaluation 06/08/23    PT Start Time 1100    PT Stop Time 1140    PT Time Calculation (min) 40 min    Activity Tolerance Patient tolerated treatment well    Behavior During Therapy [facility] for tasks assessed/performed              Past Medical History:  Diagnosis Date   Acute blood loss anemia 10/05/2020   Acute respiratory failure (HCC), hypothermia therapy, vent - extubated 10/06/20    AKI (acute kidney injury) (HCC) 10/05/2020   Anoxic brain injury (HCC) 10/26/2020   Arrhythmia    Ascending aortic aneurysm (HCC) 01/29/2018   a. 2019 4.3 cm by echo; b. 02/2021 Echo: Ao root 40mm.   Benign brain tumor (HCC)    Benign neoplasm of brain (HCC) 12/19/2017   Cardiac arrest with ventricular fibrillation (HCC)    Chest pain 12/04/2017   Chronic kidney disease, stage III (moderate) (HCC) 12/19/2017   CKD (chronic kidney disease), stage III - IV (HCC)    Conductive hearing loss of both ears 08/24/2018   Coronary Artery Disease    a. 09/2020 Inf STEMI complicated by cardiac arrest/PCI: LAD 50p/m, LCX 100d (2.5 x 30 Resolute Onyx DES); b. 04/2021 PCI: 04/2021 LM nl, LAD 50p/56m (2.5x30 Onyx Frontier DES), LCX 25p, 86m (RFR 0.98), 40d ISR (RFR 0.98), OM2 25, RCA mild diff dzs.   Diabetes mellitus, type 2 (HCC) 10/06/2020   10/06/20 A1C 7%   Dizziness 10/11/2019   Encounter for imaging study to confirm orogastric (OG) tube placement    Eustachian tube dysfunction 08/24/2018   Hematuria 10/26/2020   HFrEF (heart failure with reduced ejection fraction) (HCC)    a. 11/2020 Echo: EF 25-30%; b. 02/2021 Echo: EF 25-30%, glob HK. Mild LVH. Nl RV fxn. Triv AI. Ao root 40mm.   History of pulmonary embolus (PE)    HLD (hyperlipidemia) 10/26/2020    Hypertension    Iron deficiency anemia rec'd IV iron 10/26/2020   Ischemic cardiomyopathy    a. 11/2020 Echo: EF 25-30%; b. 02/2021 Echo: EF 25-30%, glob HK.   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 Marland KitchenSpirometry 03/08/2018    FEV1 3.86 (108%)  Ratio 96 s prior rx  - PET  03/13/18   Low grade c/w adenoca > rec T surgery eval     STEMI (ST elevation myocardial infarction) (HCC) 10/04/2020   Urinary retention 10/26/2020   Vestibular schwannoma (HCC) 08/14/2020   Past Surgical History:  Procedure Laterality Date   APPENDECTOMY     BIV ICD  INSERTION CRT-D N/A 11/03/2021   Procedure: BIV ICD INSERTION CRT-D;  Surgeon: Regan Lemming, MD;  [facility] INVASIVE CV LAB;  Service: Cardiovascular;  Laterality: N/A;   CARDIAC CATHETERIZATION     CORONARY ANGIOPLASTY WITH STENT PLACEMENT Left 04/12/2021   LAD stent placement   CORONARY PRESSURE/FFR STUDY N/A 04/12/2021   Procedure: INTRAVASCULAR PRESSURE WIRE/FFR STUDY;  Surgeon: Yvonne Kendall, MD;  Location: [facility] INVASIVE CV LAB;  Service: Cardiovascular;  Laterality: N/A;   CORONARY STENT INTERVENTION N/A 04/12/2021   Procedure: CORONARY STENT INTERVENTION;  Surgeon: Yvonne Kendall, MD;  Location: [facility] INVASIVE CV LAB;  Service: Cardiovascular;  Laterality: N/A;   CORONARY ULTRASOUND/IVUS N/A 04/12/2021   Procedure:  Intravascular Ultrasound/IVUS;  Surgeon: Yvonne Kendall, MD;  Location: [facility] INVASIVE CV LAB;  Service: Cardiovascular;  Laterality: N/A;   CORONARY/GRAFT ACUTE MI REVASCULARIZATION N/A 10/04/2020   Procedure: Coronary/Graft Acute MI Revascularization;  Surgeon: Yvonne Kendall, MD;  Location: [facility] INVASIVE CV LAB;  Service: Cardiovascular;  Laterality: N/A;   HERNIA REPAIR     LEFT HEART CATH AND CORONARY ANGIOGRAPHY N/A 10/04/2020   Procedure: LEFT HEART CATH AND CORONARY ANGIOGRAPHY;  Surgeon: Yvonne Kendall, MD;  Location: [facility] INVASIVE CV LAB;  Service: Cardiovascular;  Laterality: N/A;   RIGHT/LEFT HEART CATH AND CORONARY ANGIOGRAPHY N/A 04/12/2021   Procedure: RIGHT/LEFT HEART CATH AND CORONARY ANGIOGRAPHY;  Surgeon: Yvonne Kendall, MD;  Location: [facility] INVASIVE CV LAB;  Service: Cardiovascular;  Laterality: N/A;   Patient Active Problem List   Diagnosis Date Noted   Anemia    Leukocytosis    Right arm weakness 01/25/2022   Right sided weakness    Fall at home 01/22/2022   Status post biventricular pacemaker - MRI compatible PPM and leads 01/22/2022   Chronic indwelling Foley catheter 01/22/2022   CKD (chronic kidney disease), stage III - IV (HCC) 06/28/2021   Chronic HFrEF (heart failure with reduced ejection fraction) (HCC) 03/31/2021   Arrhythmia    DM (diabetes mellitus), type 2 (HCC) new 10/26/2020   Urinary retention 10/26/2020   Hyperlipidemia LDL goal <70 10/26/2020   Coronary artery disease of native artery of native heart with stable angina pectoris (HCC) 10/26/2020   S/P angioplasty with stent 10/04/20 DES to LCX  10/26/2020   Ischemic cardiomyopathy 10/26/2020   Diabetes mellitus, type 2 (HCC) 10/06/2020   DOE (dyspnea on exertion)    Meningioma (HCC) 08/14/2020   Vestibular schwannoma (HCC) 08/14/2020   Cardiac arrest with ventricular fibrillation (HCC)    Pain of left hip joint 05/29/2020   Benign brain tumor (HCC)    Chronic kidney disease    History of pulmonary  embolus (PE)    Hypertension    Nonsustained ventricular tachycardia (HCC) 10/11/2019   Dizziness 10/11/2019   Abnormal echocardiogram 06/07/2019   Eustachian tube dysfunction 08/24/2018   Conductive hearing loss of both ears 08/24/2018   Pulmonary hypertension due to thromboembolism (HCC) 03/09/2018   Solitary pulmonary nodule on lung CT 03/08/2018   Ascending aortic aneurysm (HCC) 4.2 cm based on CT from December 2020 01/29/2018   Sinus bradycardia 12/22/2017   Left bundle branch block 12/19/2017   Personal history of pulmonary embolism - no longer on anticoagulation due to pt refusal to take it anymore. 12/19/2017   Chronic kidney disease, stage III (moderate) (HCC) 12/19/2017   Benign neoplasm of brain (HCC) 12/19/2017    PCP: Gweneth Dimitri, MD  REFERRING PROVIDER: Abram Sander DIAG:  [id number] (ICD-10-CM) - Left hip pain  M25.551 (ICD-10-CM) - Pain  in right hip  M54.2 (ICD-10-CM) - Cervicalgia    THERAPY DIAG:  Unsteadiness on feet  Right sided weakness  Meningioma (HCC)  Muscle weakness (generalized)  Other lack of coordination  Rationale for Evaluation and Treatment: Rehabilitation  ONSET DATE: 03/20/23  SUBJECTIVE:   SUBJECTIVE STATEMENT: Patient reports a fall yesterday. He missed the last step when walking down the steps and fell back onto the step. His ribs on the L ant are sore and his lower back, mid spine is also sore.   PERTINENT HISTORY: Per previous therapy notes: left parafalcine meningioma. Frequent falls, R RC injury  PAIN:  Are you having pain? Yes: NPRS scale: 0/10 Pain location: R knee pain-distal/lat/ant to knee. He also reports B hip pain and back Pain description: sharp Aggravating factors: Hurts the most at night Relieving factors: Salon Pase  PRECAUTIONS: Fall  RED FLAGS: None   WEIGHT BEARING RESTRICTIONS: No  FALLS:  Has patient fallen in last 6 months? No  LIVING ENVIRONMENT: Lives with: lives with their  family Lives in: House/apartment Stairs: Yes: External: 7 steps; bilateral but cannot reach both Has following equipment at home: Single point cane, Walker - 2 wheeled, and shower chair  OCCUPATION: Retired  PLOF: Independent  PATIENT GOALS: Improve his ROM in his joints without pain.  NEXT MD VISIT: This afternoon  OBJECTIVE:   DIAGNOSTIC FINDINGS:  CT Lumbar- 3. Moderate degenerative spinal stenosis at L2-3. CT CERVICAL IMPRESSION: 1. No evidence of intracranial injury. 2. Negative for cervical spine fracture but there is prevertebral edema at C4-5 suggesting soft tissue injury. 3. Meningiomas with left cerebral of edema, reference brain MRI 01/22/2022.  COGNITION: Overall cognitive status: Within functional limits for tasks assessed     SENSATION: Patient reports numbness in entire R U and LE  EDEMA:  Patient denies  MUSCLE LENGTH: Hamstrings: B HS 70 Thomas test: B hip flexor tightness  POSTURE: rounded shoulders, forward head, decreased lumbar lordosis, and decreased thoracic kyphosis  PALPATION: TTP along B lumbar paraspinals and QL near iliac crests  CERVICAL ROM:  < 50% in all directions with muscle pull at end range  LUMBAR ROM: 50/70% in all directions with pain when sidebending to L and rotating to R.   LOWER EXTREMITY ROM: B hip ROM limited mildly in all planes, general stiffness in all joints, but WFL  LOWER EXTREMITY MMT: 4-(4-)/5, throughout, shaky with decreased control and clonic movements  LOWER EXTREMITY SPECIAL TESTS:  Hip special tests: Luisa Hart (FABER) test: positive  and Hip scouring test: negative Knee special tests: Patellafemoral apprehension test: positive  R knee started to sublux with minimal lateral press, causing pain   FUNCTIONAL TESTS:  5 times sit to stand: TBD Timed up and go (TUG): TBD  GAIT: Distance walked: In Clinic Distances Assistive device utilized: None Level of assistance: Complete Independence Comments: Patient  appeared much more stable when entering. Rose from chair and walked back with no sway or unsteadiness. However, later in the assessment he experienced unsteadiness when rising from chair.   TODAY'S TREATMENT:  DATE:  04/12/23 NuStep L5 x 6 minutes Low back muscular assessment- No bruising or TTP, stiffness and soreness with mobility. Attempted to lie down, but patient reported too much pain in his chest.  04/10/23 Bike L3.5 x 7 min Resisted gait 30lb 4 way x 3 each HS curls 35lb 2x15 Leg Ext 10lb 2x15 S2S elevated mat 2x5, then LE's on airex 2x5 ALT 6in box taps x10, then fro airex x10  Rows and Ext green on airex 2x10 each   04/03/23 TUG 12 sec 5x STS 14.36 sec NuStep L5 x 6 mins DLS 10 sec DLS on airex 30 sec x3 Step ups onto airex in // bars CGA x10 each Step ups 4 in x10; 6 in x10; mod assist +2 Lateral step ups 6 in with R x5; mod assist +2; pt. had dizziness  03/30/23 Education   PATIENT EDUCATION:  Education details: POC Person educated: Patient Education method: Explanation Education comprehension: verbalized understanding  HOME EXERCISE PROGRAM: [id number]-from previous round of PT  ASSESSMENT:  CLINICAL IMPRESSION: Pt reports a fall when he missed the last step going down. He landed back onto the steps. Reports increased chest pain-L ant ribs/lower, as well as low back pain, which does appear to be muscular. His rib pain was initially only with deep breathing, but when he tried to lie down to stretch lower back, he reported severe pain and had to return to sit. At this point we decided to defer further therapy today to allow patient to contact his Dr regarding the pain.  OBJECTIVE IMPAIRMENTS: Abnormal gait, decreased activity tolerance, decreased balance, decreased coordination, decreased mobility, difficulty walking, decreased ROM, decreased  strength, impaired flexibility, and pain.   ACTIVITY LIMITATIONS: carrying, lifting, bending, standing, squatting, stairs, transfers, and locomotion level  PARTICIPATION LIMITATIONS: meal prep, cleaning, laundry, shopping, and community activity  PERSONAL FACTORS: Past/current experiences and 1 comorbidity: Memingioma  are also affecting patient's functional outcome.   REHAB POTENTIAL: Good  CLINICAL DECISION MAKING: Evolving/moderate complexity  EVALUATION COMPLEXITY: Moderate   GOALS: Goals reviewed with patient? Yes  SHORT TERM GOALS: Target date: 04/12/23 I with initial HEP updates Baseline: Goal status: INITIAL  LONG TERM GOALS: Target date: 06/08/23  I with final HEP Baseline:  Goal status: INITIAL  2.  Complete TUG in < 15 sec Baseline:  Goal status: Met 04/03/23  3.  Complete 5 x STS in < 15 sec Baseline:  /Goal status: Met 04/03/23  4.  Pati/ent will return to walking program st home, able to walk x at least 20 minutes on level/unlevel surfaces MI. Baseline: Has not been walking due to pain and unsteadiness. Goal status: INITIAL  5.  Patient will report improved pain in any of his joints or back to <3/10 Baseline:  Goal status: INITIAL  PLAN:  PT FREQUENCY: 1-2x/week  PT DURATION: 10 weeks  PLANNED INTERVENTIONS: Therapeutic exercises, Therapeutic activity, Neuromuscular re-education, Balance training, Gait training, Patient/Family education, Self Care, Joint mobilization, Stair training, Dry Needling, Electrical stimulation, Cryotherapy, Moist heat, Taping, Vasopneumatic device, Ionotophoresis 4mg /ml Dexamethasone, and Manual therapy  PLAN FOR NEXT SESSION: continue with balance activities   Iona Beard, DPT,  04/12/2023, 11:23 AM

## 2023-04-12 NOTE — ED Triage Notes (Addendum)
Pt with multiple falls in the last week; last week he fell with a sledge hammer in hand which then hit chest; he fell backward on steps last night; noticed LT CP after first fall, worsened today; pain is worse with movement and esp when lying flat; has ICD and sts that base light kept going off last night; no shocks and no calls re: alerts

## 2023-04-13 DIAGNOSIS — W19XXXA Unspecified fall, initial encounter: Secondary | ICD-10-CM | POA: Diagnosis not present

## 2023-04-13 DIAGNOSIS — R079 Chest pain, unspecified: Secondary | ICD-10-CM | POA: Diagnosis not present

## 2023-04-17 ENCOUNTER — Ambulatory Visit: Payer: Medicare Other | Admitting: Physical Therapy

## 2023-04-18 ENCOUNTER — Ambulatory Visit
Admission: RE | Admit: 2023-04-18 | Discharge: 2023-04-18 | Disposition: A | Discharge status: Home / Self Care | Payer: Medicare Other | Source: Ambulatory Visit | Attending: General Surgery | Admitting: General Surgery

## 2023-04-18 ENCOUNTER — Other Ambulatory Visit: Payer: Self-pay | Admitting: General Surgery

## 2023-04-18 DIAGNOSIS — S2241XA Multiple fractures of ribs, right side, initial encounter for closed fracture: Secondary | ICD-10-CM

## 2023-04-18 DIAGNOSIS — R079 Chest pain, unspecified: Secondary | ICD-10-CM | POA: Diagnosis not present

## 2023-04-18 DIAGNOSIS — J9811 Atelectasis: Secondary | ICD-10-CM | POA: Diagnosis not present

## 2023-04-18 DIAGNOSIS — J90 Pleural effusion, not elsewhere classified: Secondary | ICD-10-CM | POA: Diagnosis not present

## 2023-04-19 ENCOUNTER — Ambulatory Visit: Payer: Medicare Other | Admitting: Physical Therapy

## 2023-04-19 DIAGNOSIS — S2243XD Multiple fractures of ribs, bilateral, subsequent encounter for fracture with routine healing: Secondary | ICD-10-CM | POA: Diagnosis not present

## 2023-04-24 ENCOUNTER — Encounter: Payer: Self-pay | Admitting: Physical Therapy

## 2023-04-24 ENCOUNTER — Ambulatory Visit: Payer: Medicare Other | Attending: Family Medicine | Admitting: Physical Therapy

## 2023-04-24 DIAGNOSIS — R296 Repeated falls: Secondary | ICD-10-CM | POA: Insufficient documentation

## 2023-04-24 DIAGNOSIS — R2681 Unsteadiness on feet: Secondary | ICD-10-CM | POA: Insufficient documentation

## 2023-04-24 DIAGNOSIS — R531 Weakness: Secondary | ICD-10-CM | POA: Insufficient documentation

## 2023-04-24 DIAGNOSIS — R29898 Other symptoms and signs involving the musculoskeletal system: Secondary | ICD-10-CM | POA: Insufficient documentation

## 2023-04-24 DIAGNOSIS — M6281 Muscle weakness (generalized): Secondary | ICD-10-CM | POA: Diagnosis not present

## 2023-04-24 DIAGNOSIS — D329 Benign neoplasm of meninges, unspecified: Secondary | ICD-10-CM | POA: Insufficient documentation

## 2023-04-24 NOTE — Therapy (Signed)
OUTPATIENT PHYSICAL THERAPY LOWER EXTREMITY EVALUATION   Patient Name: Timothy Moran MRN: [id number] DOB:May 08, 1946, 77 y.o., male Today's Date: 04/24/2023  END OF SESSION:  PT End of Session - 04/24/23 1110     Visit Number 5    Date for PT Re-Evaluation 06/08/23    PT Start Time 1100    PT Stop Time 1140    PT Time Calculation (min) 40 min    Activity Tolerance Patient tolerated treatment well    Behavior During Therapy [facility] for tasks assessed/performed               Past Medical History:  Diagnosis Date   Acute blood loss anemia 10/05/2020   Acute respiratory failure (HCC), hypothermia therapy, vent - extubated 10/06/20    AKI (acute kidney injury) (HCC) 10/05/2020   Anoxic brain injury (HCC) 10/26/2020   Arrhythmia    Ascending aortic aneurysm (HCC) 01/29/2018   a. 2019 4.3 cm by echo; b. 02/2021 Echo: Ao root 40mm.   Benign brain tumor (HCC)    Benign neoplasm of brain (HCC) 12/19/2017   Cardiac arrest with ventricular fibrillation (HCC)    Chest pain 12/04/2017   Chronic kidney disease, stage III (moderate) (HCC) 12/19/2017   CKD (chronic kidney disease), stage III - IV (HCC)    Conductive hearing loss of both ears 08/24/2018   Coronary Artery Disease    a. 09/2020 Inf STEMI complicated by cardiac arrest/PCI: LAD 50p/m, LCX 100d (2.5 x 30 Resolute Onyx DES); b. 04/2021 PCI: 04/2021 LM nl, LAD 50p/28m (2.5x30 Onyx Frontier DES), LCX 25p, 82m (RFR 0.98), 40d ISR (RFR 0.98), OM2 25, RCA mild diff dzs.   Diabetes mellitus, type 2 (HCC) 10/06/2020   10/06/20 A1C 7%   Dizziness 10/11/2019   Encounter for imaging study to confirm orogastric (OG) tube placement    Eustachian tube dysfunction 08/24/2018   Hematuria 10/26/2020   HFrEF (heart failure with reduced ejection fraction) (HCC)    a. 11/2020 Echo: EF 25-30%; b. 02/2021 Echo: EF 25-30%, glob HK. Mild LVH. Nl RV fxn. Triv AI. Ao root 40mm.   History of pulmonary embolus (PE)    HLD (hyperlipidemia) 10/26/2020    Hypertension    Iron deficiency anemia rec'd IV iron 10/26/2020   Ischemic cardiomyopathy    a. 11/2020 Echo: EF 25-30%; b. 02/2021 Echo: EF 25-30%, glob HK.   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 Marland KitchenSpirometry 03/08/2018    FEV1 3.86 (108%)  Ratio 96 s prior rx  - PET  03/13/18   Low grade c/w adenoca > rec T surgery eval     STEMI (ST elevation myocardial infarction) (HCC) 10/04/2020   Urinary retention 10/26/2020   Vestibular schwannoma (HCC) 08/14/2020   Past Surgical History:  Procedure Laterality Date   APPENDECTOMY     BIV  ICD INSERTION CRT-D N/A 11/03/2021   Procedure: BIV ICD INSERTION CRT-D;  Surgeon: Regan Lemming, MD;  [facility] INVASIVE CV LAB;  Service: Cardiovascular;  Laterality: N/A;   CARDIAC CATHETERIZATION     CORONARY ANGIOPLASTY WITH STENT PLACEMENT Left 04/12/2021   LAD stent placement   CORONARY PRESSURE/FFR STUDY N/A 04/12/2021   Procedure: INTRAVASCULAR PRESSURE WIRE/FFR STUDY;  Surgeon: Yvonne Kendall, MD;  Location: [facility] INVASIVE CV LAB;  Service: Cardiovascular;  Laterality: N/A;   CORONARY STENT INTERVENTION N/A 04/12/2021   Procedure: CORONARY STENT INTERVENTION;  Surgeon: Yvonne Kendall, MD;  Location: [facility] INVASIVE CV LAB;  Service: Cardiovascular;  Laterality: N/A;   CORONARY ULTRASOUND/IVUS N/A 04/12/2021   Procedure:  Intravascular Ultrasound/IVUS;  Surgeon: Yvonne Kendall, MD;  Location: [facility] INVASIVE CV LAB;  Service: Cardiovascular;  Laterality: N/A;   CORONARY/GRAFT ACUTE MI REVASCULARIZATION N/A 10/04/2020   Procedure: Coronary/Graft Acute MI Revascularization;  Surgeon: Yvonne Kendall, MD;  Location: [facility] INVASIVE CV LAB;  Service: Cardiovascular;  Laterality: N/A;   HERNIA REPAIR     LEFT HEART CATH AND CORONARY ANGIOGRAPHY N/A 10/04/2020   Procedure: LEFT HEART CATH AND CORONARY ANGIOGRAPHY;  Surgeon: Yvonne Kendall, MD;  Location: [facility] INVASIVE CV LAB;  Service: Cardiovascular;  Laterality: N/A;   RIGHT/LEFT HEART CATH AND CORONARY ANGIOGRAPHY N/A 04/12/2021   Procedure: RIGHT/LEFT HEART CATH AND CORONARY ANGIOGRAPHY;  Surgeon: Yvonne Kendall, MD;  Location: [facility] INVASIVE CV LAB;  Service: Cardiovascular;  Laterality: N/A;   Patient Active Problem List   Diagnosis Date Noted   Anemia    Leukocytosis    Right arm weakness 01/25/2022   Right sided weakness    Fall at home 01/22/2022   Status post biventricular pacemaker - MRI compatible PPM and leads 01/22/2022   Chronic indwelling Foley catheter 01/22/2022   CKD (chronic kidney disease), stage III - IV (HCC) 06/28/2021   Chronic HFrEF (heart failure with reduced ejection fraction) (HCC) 03/31/2021   Arrhythmia    DM (diabetes mellitus), type 2 (HCC) new 10/26/2020   Urinary retention 10/26/2020   Hyperlipidemia LDL goal <70 10/26/2020   Coronary artery disease of native artery of native heart with stable angina pectoris (HCC) 10/26/2020   S/P angioplasty with stent 10/04/20 DES to LCX  10/26/2020   Ischemic cardiomyopathy 10/26/2020   Diabetes mellitus, type 2 (HCC) 10/06/2020   DOE (dyspnea on exertion)    Meningioma (HCC) 08/14/2020   Vestibular schwannoma (HCC) 08/14/2020   Cardiac arrest with ventricular fibrillation (HCC)    Pain of left hip joint 05/29/2020   Benign brain tumor (HCC)    Chronic kidney disease    History of pulmonary  embolus (PE)    Hypertension    Nonsustained ventricular tachycardia (HCC) 10/11/2019   Dizziness 10/11/2019   Abnormal echocardiogram 06/07/2019   Eustachian tube dysfunction 08/24/2018   Conductive hearing loss of both ears 08/24/2018   Pulmonary hypertension due to thromboembolism (HCC) 03/09/2018   Solitary pulmonary nodule on lung CT 03/08/2018   Ascending aortic aneurysm (HCC) 4.2 cm based on CT from December 2020 01/29/2018   Sinus bradycardia 12/22/2017   Left bundle branch block 12/19/2017   Personal history of pulmonary embolism - no longer on anticoagulation due to pt refusal to take it anymore. 12/19/2017   Chronic kidney disease, stage III (moderate) (HCC) 12/19/2017   Benign neoplasm of brain (HCC) 12/19/2017    PCP: Gweneth Dimitri, MD  REFERRING PROVIDER: Abram Sander DIAG:  [phone] (ICD-10-CM) - Left hip pain  M25.551 (ICD-10-CM) -  Pain in right hip  M54.2 (ICD-10-CM) - Cervicalgia    THERAPY DIAG:  Unsteadiness on feet  Muscle weakness (generalized)  Right sided weakness  Repeated falls  Rationale for Evaluation and Treatment: Rehabilitation  ONSET DATE: 03/20/23  SUBJECTIVE:   SUBJECTIVE STATEMENT: Patient saw the Dr. Juliann Pares rays revealed R 8th and 9th rib fractures, but his pain is on his L ribcage. He is still having some pain, but it is improving. He reports numbness on the entire R side.    PERTINENT HISTORY: Per previous therapy notes: left parafalcine meningioma. Frequent falls, R RC injury  PAIN:  Are you having pain? Yes: NPRS scale: 0/10 Pain location: R knee pain-distal/lat/ant to knee. He also reports B hip pain and back Pain description: sharp Aggravating factors: Hurts the most at night Relieving factors: Salon Pase  PRECAUTIONS: Fall  RED FLAGS: None   WEIGHT BEARING RESTRICTIONS: No  FALLS:  Has patient fallen in last 6 months? No  LIVING ENVIRONMENT: Lives with: lives with their family Lives in:  House/apartment Stairs: Yes: External: 7 steps; bilateral but cannot reach both Has following equipment at home: Single point cane, Walker - 2 wheeled, and shower chair  OCCUPATION: Retired  PLOF: Independent  PATIENT GOALS: Improve his ROM in his joints without pain.  NEXT MD VISIT: This afternoon  OBJECTIVE:   DIAGNOSTIC FINDINGS:  CT Lumbar- 3. Moderate degenerative spinal stenosis at L2-3. CT CERVICAL IMPRESSION: 1. No evidence of intracranial injury. 2. Negative for cervical spine fracture but there is prevertebral edema at C4-5 suggesting soft tissue injury. 3. Meningiomas with left cerebral of edema, reference brain MRI 01/22/2022.  COGNITION: Overall cognitive status: Within functional limits for tasks assessed     SENSATION: Patient reports numbness in entire R U and LE  EDEMA:  Patient denies  MUSCLE LENGTH: Hamstrings: B HS 70 Thomas test: B hip flexor tightness  POSTURE: rounded shoulders, forward head, decreased lumbar lordosis, and decreased thoracic kyphosis  PALPATION: TTP along B lumbar paraspinals and QL near iliac crests  CERVICAL ROM:  < 50% in all directions with muscle pull at end range  LUMBAR ROM: 50/70% in all directions with pain when sidebending to L and rotating to R.   LOWER EXTREMITY ROM: B hip ROM limited mildly in all planes, general stiffness in all joints, but WFL  LOWER EXTREMITY MMT: 4-(4-)/5, throughout, shaky with decreased control and clonic movements  LOWER EXTREMITY SPECIAL TESTS:  Hip special tests: Luisa Hart (FABER) test: positive  and Hip scouring test: negative Knee special tests: Patellafemoral apprehension test: positive  R knee started to sublux with minimal lateral press, causing pain   FUNCTIONAL TESTS:  5 times sit to stand: TBD Timed up and go (TUG): TBD  GAIT: Distance walked: In Clinic Distances Assistive device utilized: None Level of assistance: Complete Independence Comments: Patient appeared much  more stable when entering. Rose from chair and walked back with no sway or unsteadiness. However, later in the assessment he experienced unsteadiness when rising from chair.   TODAY'S TREATMENT:  DATE:  04/24/23 Bike L4 x 6 minutes Knee flexion, 35#, 2 x 15 reps Knee ext, 20#, 2 x 15 reps. Standing on Airex pad, weight shifts-lat and ant/post, mini squats. B side step onto and off the pad. All performed with back to the wall for safety, not touching. Ambulation x 80' with 6# weight in each hand, then again, with 3# weight on each ankle to increase proprioceptive feedback. No change with hand weights, but slightly less muscle quivering and clonic activity with weights on ankle. Leg press, 40#, BLE slow movement, 3 x 10 reps Heel raise on leg press, 40#, 2 x 10 reps.  04/12/23 NuStep L5 x 6 minutes Low back muscular assessment- No bruising or TTP, stiffness and soreness with mobility. Attempted to lie down, but patient reported too much pain in his chest.  04/10/23 Bike L3.5 x 7 min Resisted gait 30lb 4 way x 3 each HS curls 35lb 2x15 Leg Ext 10lb 2x15 S2S elevated mat 2x5, then LE's on airex 2x5 ALT 6in box taps x10, then fro airex x10  Rows and Ext green on airex 2x10 each  04/03/23 TUG 12 sec 5x STS 14.36 sec NuStep L5 x 6 mins DLS 10 sec DLS on airex 30 sec x3 Step ups onto airex in // bars CGA x10 each Step ups 4 in x10; 6 in x10; mod assist +2 Lateral step ups 6 in with R x5; mod assist +2; pt. had dizziness  03/30/23 Education   PATIENT EDUCATION:  Education details: POC Person educated: Patient Education method: Explanation Education comprehension: verbalized understanding  HOME EXERCISE PROGRAM: [id number]-from previous round of PT  ASSESSMENT:  CLINICAL IMPRESSION: Pt saw Dr who took x rays revealing rib fractures on R 8th and 9th ribs, but  no injury to L ribs, which is where he is having the pain. He also demonstrates increased instability upon first standing. Reports no dizziness, but he is shaky and sways upon first rising. Treatment emphasized BLE strength as well as balance training. Balance training was challenging. He is once again demonstrating clonic muscle activities which appear to be strongly contributing to his unsteadiness. He is planning to schedule a Neurologist appointment and will breing it up to the Dr.  Dione Housekeeper IMPAIRMENTS: Abnormal gait, decreased activity tolerance, decreased balance, decreased coordination, decreased mobility, difficulty walking, decreased ROM, decreased strength, impaired flexibility, and pain.   ACTIVITY LIMITATIONS: carrying, lifting, bending, standing, squatting, stairs, transfers, and locomotion level  PARTICIPATION LIMITATIONS: meal prep, cleaning, laundry, shopping, and community activity  PERSONAL FACTORS: Past/current experiences and 1 comorbidity: Memingioma  are also affecting patient's functional outcome.   REHAB POTENTIAL: Good  CLINICAL DECISION MAKING: Evolving/moderate complexity  EVALUATION COMPLEXITY: Moderate   GOALS: Goals reviewed with patient? Yes  SHORT TERM GOALS: Target date: 04/12/23 I with initial HEP updates Baseline: Goal status: INITIAL  LONG TERM GOALS: Target date: 06/08/23  I with final HEP Baseline:  Goal status: INITIAL  2.  Complete TUG in < 15 sec Baseline:  Goal status: Met 04/03/23  3.  Complete 5 x STS in < 15 sec Baseline:  /Goal status: Met 04/03/23  4.  Pati/ent will return to walking program st home, able to walk x at least 20 minutes on level/unlevel surfaces MI. Baseline: Has not been walking due to pain and unsteadiness. Goal status: 04/24/23 Walking about 500-800 steps inside home-ongoing.  5.  Patient will report improved pain in any of his joints or back to <3/10 Baseline:  Goal status: INITIAL  PLAN:  PT FREQUENCY:  1-2x/week  PT DURATION: 10 weeks  PLANNED INTERVENTIONS: Therapeutic exercises, Therapeutic activity, Neuromuscular re-education, Balance training, Gait training, Patient/Family education, Self Care, Joint mobilization, Stair training, Dry Needling, Electrical stimulation, Cryotherapy, Moist heat, Taping, Vasopneumatic device, Ionotophoresis 4mg /ml Dexamethasone, and Manual therapy  PLAN FOR NEXT SESSION: continue with balance activities   Iona Beard, DPT,  04/24/2023, 11:42 AM

## 2023-04-26 ENCOUNTER — Encounter: Payer: Self-pay | Admitting: Physical Therapy

## 2023-04-26 ENCOUNTER — Ambulatory Visit: Payer: Medicare Other | Admitting: Physical Therapy

## 2023-04-26 DIAGNOSIS — R531 Weakness: Secondary | ICD-10-CM | POA: Diagnosis not present

## 2023-04-26 DIAGNOSIS — D329 Benign neoplasm of meninges, unspecified: Secondary | ICD-10-CM

## 2023-04-26 DIAGNOSIS — R2681 Unsteadiness on feet: Secondary | ICD-10-CM | POA: Diagnosis not present

## 2023-04-26 DIAGNOSIS — M6281 Muscle weakness (generalized): Secondary | ICD-10-CM | POA: Diagnosis not present

## 2023-04-26 DIAGNOSIS — R296 Repeated falls: Secondary | ICD-10-CM

## 2023-04-26 DIAGNOSIS — R29898 Other symptoms and signs involving the musculoskeletal system: Secondary | ICD-10-CM | POA: Diagnosis not present

## 2023-04-26 NOTE — Therapy (Signed)
OUTPATIENT PHYSICAL THERAPY LOWER EXTREMITY EVALUATION   Patient Name: Timothy Moran MRN: [id number] DOB:[birth year], 77 y.o., male Today's Date: 04/26/2023  END OF SESSION:  PT End of Session - 04/26/23 1102     Visit Number 6    Date for PT Re-Evaluation 06/08/23    PT Start Time 1100    PT Stop Time 1140    PT Time Calculation (min) 40 min    Activity Tolerance Patient tolerated treatment well    Behavior During Therapy [facility] for tasks assessed/performed               Past Medical History:  Diagnosis Date   Acute blood loss anemia 10/05/2020   Acute respiratory failure (HCC), hypothermia therapy, vent - extubated 10/06/20    AKI (acute kidney injury) (HCC) 10/05/2020   Anoxic brain injury (HCC) 10/26/2020   Arrhythmia    Ascending aortic aneurysm (HCC) 01/29/2018   a. 2019 4.3 cm by echo; b. 02/2021 Echo: Ao root 40mm.   Benign brain tumor (HCC)    Benign neoplasm of brain (HCC) 12/19/2017   Cardiac arrest with ventricular fibrillation (HCC)    Chest pain 12/04/2017   Chronic kidney disease, stage III (moderate) (HCC) 12/19/2017   CKD (chronic kidney disease), stage III - IV (HCC)    Conductive hearing loss of both ears 08/24/2018   Coronary Artery Disease    a. 09/2020 Inf STEMI complicated by cardiac arrest/PCI: LAD 50p/m, LCX 100d (2.5 x 30 Resolute Onyx DES); b. 04/2021 PCI: 04/2021 LM nl, LAD 50p/41m (2.5x30 Onyx Frontier DES), LCX 25p, 51m (RFR 0.98), 40d ISR (RFR 0.98), OM2 25, RCA mild diff dzs.   Diabetes mellitus, type 2 (HCC) 10/06/2020   10/06/20 A1C 7%   Dizziness 10/11/2019   Encounter for imaging study to confirm orogastric (OG) tube placement    Eustachian tube dysfunction 08/24/2018   Hematuria 10/26/2020   HFrEF (heart failure with reduced ejection fraction) (HCC)    a. 11/2020 Echo: EF 25-30%; b. 02/2021 Echo: EF 25-30%, glob HK. Mild LVH. Nl RV fxn. Triv AI. Ao root 40mm.   History of pulmonary embolus (PE)    HLD (hyperlipidemia) 10/26/2020    Hypertension    Iron deficiency anemia rec'd IV iron 10/26/2020   Ischemic cardiomyopathy    a. 11/2020 Echo: EF 25-30%; b. 02/2021 Echo: EF 25-30%, glob HK.   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 Marland KitchenSpirometry 03/08/2018    FEV1 3.86 (108%)  Ratio 96 s prior rx  - PET  03/13/18   Low grade c/w adenoca > rec T surgery eval     STEMI (ST elevation myocardial infarction) (HCC) 10/04/2020   Urinary retention 10/26/2020   Vestibular schwannoma (HCC) 08/14/2020   Past Surgical History:  Procedure Laterality Date   APPENDECTOMY     BIV  ICD INSERTION CRT-D N/A 11/03/2021   Procedure: BIV ICD INSERTION CRT-D;  Surgeon: Regan Lemming, MD;  Location: [facility] INVASIVE CV LAB;  Service: Cardiovascular;  Laterality: N/A;   CARDIAC CATHETERIZATION     CORONARY ANGIOPLASTY WITH STENT PLACEMENT Left 04/12/2021   LAD stent placement   CORONARY PRESSURE/FFR STUDY N/A 04/12/2021   Procedure: INTRAVASCULAR PRESSURE WIRE/FFR STUDY;  Surgeon: Yvonne Kendall, MD;  Location: [facility] INVASIVE CV LAB;  Service: Cardiovascular;  Laterality: N/A;   CORONARY STENT INTERVENTION N/A 04/12/2021   Procedure: CORONARY STENT INTERVENTION;  Surgeon: Yvonne Kendall, MD;  Location: [facility] INVASIVE CV LAB;  Service: Cardiovascular;  Laterality: N/A;   CORONARY ULTRASOUND/IVUS N/A 04/12/2021   Procedure:  Intravascular Ultrasound/IVUS;  Surgeon: Yvonne Kendall, MD;  Location: [facility] INVASIVE CV LAB;  Service: Cardiovascular;  Laterality: N/A;   CORONARY/GRAFT ACUTE MI REVASCULARIZATION N/A 10/04/2020   Procedure: Coronary/Graft Acute MI Revascularization;  Surgeon: Yvonne Kendall, MD;  Location: [facility] INVASIVE CV LAB;  Service: Cardiovascular;  Laterality: N/A;   HERNIA REPAIR     LEFT HEART CATH AND CORONARY ANGIOGRAPHY N/A 10/04/2020   Procedure: LEFT HEART CATH AND CORONARY ANGIOGRAPHY;  Surgeon: Yvonne Kendall, MD;  Location: [facility] INVASIVE CV LAB;  Service: Cardiovascular;  Laterality: N/A;   RIGHT/LEFT HEART CATH AND CORONARY ANGIOGRAPHY N/A 04/12/2021   Procedure: RIGHT/LEFT HEART CATH AND CORONARY ANGIOGRAPHY;  Surgeon: Yvonne Kendall, MD;  Location: [facility] INVASIVE CV LAB;  Service: Cardiovascular;  Laterality: N/A;   Patient Active Problem List   Diagnosis Date Noted   Anemia    Leukocytosis    Right arm weakness 01/25/2022   Right sided weakness    Fall at home 01/22/2022   Status post biventricular pacemaker - MRI compatible PPM and leads 01/22/2022   Chronic indwelling Foley catheter 01/22/2022   CKD (chronic kidney disease), stage III - IV (HCC) 06/28/2021   Chronic HFrEF (heart failure with reduced ejection fraction) (HCC) 03/31/2021   Arrhythmia    DM (diabetes mellitus), type 2 (HCC) new 10/26/2020   Urinary retention 10/26/2020   Hyperlipidemia LDL goal <70 10/26/2020   Coronary artery disease of native artery of native heart with stable angina pectoris (HCC) 10/26/2020   S/P angioplasty with stent 10/04/20 DES to LCX  10/26/2020   Ischemic cardiomyopathy 10/26/2020   Diabetes mellitus, type 2 (HCC) 10/06/2020   DOE (dyspnea on exertion)    Meningioma (HCC) 08/14/2020   Vestibular schwannoma (HCC) 08/14/2020   Cardiac arrest with ventricular fibrillation (HCC)    Pain of left hip joint 05/29/2020   Benign brain tumor (HCC)    Chronic kidney disease    History of pulmonary  embolus (PE)    Hypertension    Nonsustained ventricular tachycardia (HCC) 10/11/2019   Dizziness 10/11/2019   Abnormal echocardiogram 06/07/2019   Eustachian tube dysfunction 08/24/2018   Conductive hearing loss of both ears 08/24/2018   Pulmonary hypertension due to thromboembolism (HCC) 03/09/2018   Solitary pulmonary nodule on lung CT 03/08/2018   Ascending aortic aneurysm (HCC) 4.2 cm based on CT from December 2020 01/29/2018   Sinus bradycardia 12/22/2017   Left bundle branch block 12/19/2017   Personal history of pulmonary embolism - no longer on anticoagulation due to pt refusal to take it anymore. 12/19/2017   Chronic kidney disease, stage III (moderate) (HCC) 12/19/2017   Benign neoplasm of brain (HCC) 12/19/2017    PCP: Gweneth Dimitri, MD  REFERRING PROVIDER: Abram Sander DIAG:  [phone] (ICD-10-CM) - Left hip pain  M25.551 (ICD-10-CM) -  Pain in right hip  M54.2 (ICD-10-CM) - Cervicalgia    THERAPY DIAG:  Unsteadiness on feet  Muscle weakness (generalized)  Right sided weakness  Repeated falls  Meningioma [facility])  Rationale for Evaluation and Treatment: Rehabilitation  ONSET DATE: 03/20/23  SUBJECTIVE:   SUBJECTIVE STATEMENT: Patient saw the Dr. Juliann Pares rays revealed R 8th and 9th rib fractures, but his pain is on his L ribcage. He is still having some pain, but it is improving. He reports numbness on the entire R side.    PERTINENT HISTORY: Per previous therapy notes: left parafalcine meningioma. Frequent falls, R RC injury  PAIN:  Are you having pain? Yes: NPRS scale: 0/10 Pain location: R knee pain-distal/lat/ant to knee. He also reports B hip pain and back Pain description: sharp Aggravating factors: Hurts the most at night Relieving factors: Salon Pase  PRECAUTIONS: Fall  RED FLAGS: None   WEIGHT BEARING RESTRICTIONS: No  FALLS:  Has patient fallen in last 6 months? No  LIVING ENVIRONMENT: Lives with: lives with their family Lives in:  House/apartment Stairs: Yes: External: 7 steps; bilateral but cannot reach both Has following equipment at home: Single point cane, Walker - 2 wheeled, and shower chair  OCCUPATION: Retired  PLOF: Independent  PATIENT GOALS: Improve his ROM in his joints without pain.  NEXT MD VISIT: This afternoon  OBJECTIVE:   DIAGNOSTIC FINDINGS:  CT Lumbar- 3. Moderate degenerative spinal stenosis at L2-3. CT CERVICAL IMPRESSION: 1. No evidence of intracranial injury. 2. Negative for cervical spine fracture but there is prevertebral edema at C4-5 suggesting soft tissue injury. 3. Meningiomas with left cerebral of edema, reference brain MRI 01/22/2022.  COGNITION: Overall cognitive status: Within functional limits for tasks assessed     SENSATION: Patient reports numbness in entire R U and LE  EDEMA:  Patient denies  MUSCLE LENGTH: Hamstrings: B HS 70 Thomas test: B hip flexor tightness  POSTURE: rounded shoulders, forward head, decreased lumbar lordosis, and decreased thoracic kyphosis  PALPATION: TTP along B lumbar paraspinals and QL near iliac crests  CERVICAL ROM:  < 50% in all directions with muscle pull at end range  LUMBAR ROM: 50/70% in all directions with pain when sidebending to L and rotating to R.   LOWER EXTREMITY ROM: B hip ROM limited mildly in all planes, general stiffness in all joints, but WFL  LOWER EXTREMITY MMT: 4-(4-)/5, throughout, shaky with decreased control and clonic movements  LOWER EXTREMITY SPECIAL TESTS:  Hip special tests: Luisa Hart (FABER) test: positive  and Hip scouring test: negative Knee special tests: Patellafemoral apprehension test: positive  R knee started to sublux with minimal lateral press, causing pain   FUNCTIONAL TESTS:  5 times sit to stand: TBD Timed up and go (TUG): TBD  GAIT: Distance walked: In Clinic Distances Assistive device utilized: None Level of assistance: Complete Independence Comments: Patient appeared much  more stable when entering. Rose from chair and walked back with no sway or unsteadiness. However, later in the assessment he experienced unsteadiness when rising from chair.   TODAY'S TREATMENT:  DATE:  04/26/23 Bike L4 x 6 minutes, rest, then 2 x 30 sec at L6 focusing on circles. Attempted side stepping on airex plank, but ankles were too unstable and shaky Standing heel raises on step x 12 Standing toe raises x 12 Squat walking x 80' to increase challenge to control in BLE Seated HS curls, 35#, 2 x 15 Seated knee ext, 20#, 2 x 15 reps Still unable to balance on the airex beam Proprioception assessment for ankles was neg for any deficit. Upside down BOSU in parallel bars-very shaky and unstable, did not improve with time.  04/24/23 Bike L4 x 6 minutes Knee flexion, 35#, 2 x 15 reps Knee ext, 20#, 2 x 15 reps. Standing on Airex pad, weight shifts-lat and ant/post, mini squats. B side step onto and off the pad. All performed with back to the wall for safety, not touching. Ambulation x 80' with 6# weight in each hand, then again, with 3# weight on each ankle to increase proprioceptive feedback. No change with hand weights, but slightly less muscle quivering and clonic activity with weights on ankle. Leg press, 40#, BLE slow movement, 3 x 10 reps Heel raise on leg press, 40#, 2 x 10 reps.  04/12/23 NuStep L5 x 6 minutes Low back muscular assessment- No bruising or TTP, stiffness and soreness with mobility. Attempted to lie down, but patient reported too much pain in his chest.  04/10/23 Bike L3.5 x 7 min Resisted gait 30lb 4 way x 3 each HS curls 35lb 2x15 Leg Ext 10lb 2x15 S2S elevated mat 2x5, then LE's on airex 2x5 ALT 6in box taps x10, then fro airex x10  Rows and Ext green on airex 2x10 each  04/03/23 TUG 12 sec 5x STS 14.36 sec NuStep L5 x 6 mins DLS 10  sec DLS on airex 30 sec x3 Step ups onto airex in // bars CGA x10 each Step ups 4 in x10; 6 in x10; mod assist +2 Lateral step ups 6 in with R x5; mod assist +2; pt. had dizziness  03/30/23 Education   PATIENT EDUCATION:  Education details: POC Person educated: Patient Education method: Explanation Education comprehension: verbalized understanding  HOME EXERCISE PROGRAM: [id number]-from previous round of PT  ASSESSMENT:  CLINICAL IMPRESSION: Pt has a Dr appointment tomorrow with his primary Dr and expects Neuro referral after this visit. He clonic muscle activity continues, particularly in ankles, causing instability in standing and gait. Proprioceptive assessment was normal in ankles. Plan to assess tall kneeling to see if he is unstable there as well.  OBJECTIVE IMPAIRMENTS: Abnormal gait, decreased activity tolerance, decreased balance, decreased coordination, decreased mobility, difficulty walking, decreased ROM, decreased strength, impaired flexibility, and pain.   ACTIVITY LIMITATIONS: carrying, lifting, bending, standing, squatting, stairs, transfers, and locomotion level  PARTICIPATION LIMITATIONS: meal prep, cleaning, laundry, shopping, and community activity  PERSONAL FACTORS: Past/current experiences and 1 comorbidity: Memingioma  are also affecting patient's functional outcome.   REHAB POTENTIAL: Good  CLINICAL DECISION MAKING: Evolving/moderate complexity  EVALUATION COMPLEXITY: Moderate   GOALS: Goals reviewed with patient? Yes  SHORT TERM GOALS: Target date: 04/12/23 I with initial HEP updates Baseline: Goal status: 04/26/23- Has previous program. met  LONG TERM GOALS: Target date: 06/08/23  I with final HEP Baseline:  Goal status: INITIAL  2.  Complete TUG in < 15 sec Baseline:  Goal status: Met 04/03/23  3.  Complete 5 x STS in < 15 sec Baseline:  /Goal status: Met 04/03/23  4.  Pati/ent will return  to walking program st home, able to walk x at  least 20 minutes on level/unlevel surfaces MI. Baseline: Has not been walking due to pain and unsteadiness. Goal status: 04/24/23 Walking about 500-800 steps inside home-ongoing.  5.  Patient will report improved pain in any of his joints or back to <3/10 Baseline:  Goal status: INITIAL  PLAN:  PT FREQUENCY: 1-2x/week  PT DURATION: 10 weeks  PLANNED INTERVENTIONS: Therapeutic exercises, Therapeutic activity, Neuromuscular re-education, Balance training, Gait training, Patient/Family education, Self Care, Joint mobilization, Stair training, Dry Needling, Electrical stimulation, Cryotherapy, Moist heat, Taping, Vasopneumatic device, Ionotophoresis 4mg /ml Dexamethasone, and Manual therapy  PLAN FOR NEXT SESSION: 04/26/23-Assess kneeling for clonic activity.  Iona Beard, DPT,  04/26/2023, 11:49 AM

## 2023-04-27 DIAGNOSIS — R531 Weakness: Secondary | ICD-10-CM | POA: Diagnosis not present

## 2023-04-27 DIAGNOSIS — Z6830 Body mass index (BMI) 30.0-30.9, adult: Secondary | ICD-10-CM | POA: Diagnosis not present

## 2023-04-27 DIAGNOSIS — R296 Repeated falls: Secondary | ICD-10-CM | POA: Diagnosis not present

## 2023-04-27 DIAGNOSIS — R202 Paresthesia of skin: Secondary | ICD-10-CM | POA: Diagnosis not present

## 2023-05-01 ENCOUNTER — Ambulatory Visit: Payer: Medicare Other | Admitting: Physical Therapy

## 2023-05-01 ENCOUNTER — Encounter: Payer: Self-pay | Admitting: Physical Therapy

## 2023-05-01 DIAGNOSIS — R531 Weakness: Secondary | ICD-10-CM

## 2023-05-01 DIAGNOSIS — R2681 Unsteadiness on feet: Secondary | ICD-10-CM

## 2023-05-01 DIAGNOSIS — R296 Repeated falls: Secondary | ICD-10-CM

## 2023-05-01 DIAGNOSIS — R29898 Other symptoms and signs involving the musculoskeletal system: Secondary | ICD-10-CM | POA: Diagnosis not present

## 2023-05-01 DIAGNOSIS — M6281 Muscle weakness (generalized): Secondary | ICD-10-CM

## 2023-05-01 DIAGNOSIS — D329 Benign neoplasm of meninges, unspecified: Secondary | ICD-10-CM | POA: Diagnosis not present

## 2023-05-01 NOTE — Therapy (Signed)
OUTPATIENT PHYSICAL THERAPY LOWER EXTREMITY EVALUATION   Patient Name: Timothy Moran MRN: [id number] DOB:July 26, 1946, 77 y.o., male Today's Date: 05/01/2023  END OF SESSION:  PT End of Session - 05/01/23 1057     Visit Number 7    Date for PT Re-Evaluation 06/08/23    PT Start Time 1100    PT Stop Time 1145    PT Time Calculation (min) 45 min    Activity Tolerance Patient tolerated treatment well    Behavior During Therapy [facility] for tasks assessed/performed               Past Medical History:  Diagnosis Date   Acute blood loss anemia 10/05/2020   Acute respiratory failure (HCC), hypothermia therapy, vent - extubated 10/06/20    AKI (acute kidney injury) (HCC) 10/05/2020   Anoxic brain injury (HCC) 10/26/2020   Arrhythmia    Ascending aortic aneurysm (HCC) 01/29/2018   a. 2019 4.3 cm by echo; b. 02/2021 Echo: Ao root 40mm.   Benign brain tumor (HCC)    Benign neoplasm of brain (HCC) 12/19/2017   Cardiac arrest with ventricular fibrillation (HCC)    Chest pain 12/04/2017   Chronic kidney disease, stage III (moderate) (HCC) 12/19/2017   CKD (chronic kidney disease), stage III - IV (HCC)    Conductive hearing loss of both ears 08/24/2018   Coronary Artery Disease    a. 09/2020 Inf STEMI complicated by cardiac arrest/PCI: LAD 50p/m, LCX 100d (2.5 x 30 Resolute Onyx DES); b. 04/2021 PCI: 04/2021 LM nl, LAD 50p/101m (2.5x30 Onyx Frontier DES), LCX 25p, 49m (RFR 0.98), 40d ISR (RFR 0.98), OM2 25, RCA mild diff dzs.   Diabetes mellitus, type 2 (HCC) 10/06/2020   10/06/20 A1C 7%   Dizziness 10/11/2019   Encounter for imaging study to confirm orogastric (OG) tube placement    Eustachian tube dysfunction 08/24/2018   Hematuria 10/26/2020   HFrEF (heart failure with reduced ejection fraction) (HCC)    a. 11/2020 Echo: EF 25-30%; b. 02/2021 Echo: EF 25-30%, glob HK. Mild LVH. Nl RV fxn. Triv AI. Ao root 40mm.   History of pulmonary embolus (PE)    HLD (hyperlipidemia) 10/26/2020    Hypertension    Iron deficiency anemia rec'd IV iron 10/26/2020   Ischemic cardiomyopathy    a. 11/2020 Echo: EF 25-30%; b. 02/2021 Echo: EF 25-30%, glob HK.   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 Marland KitchenSpirometry 03/08/2018    FEV1 3.86 (108%)  Ratio 96 s prior rx  - PET  03/13/18   Low grade c/w adenoca > rec T surgery eval     STEMI (ST elevation myocardial infarction) (HCC) 10/04/2020   Urinary retention 10/26/2020   Vestibular schwannoma (HCC) 08/14/2020   Past Surgical History:  Procedure Laterality Date   APPENDECTOMY     BIV  ICD INSERTION CRT-D N/A 11/03/2021   Procedure: BIV ICD INSERTION CRT-D;  Surgeon: Regan Lemming, MD;  Location: [facility] INVASIVE CV LAB;  Service: Cardiovascular;  Laterality: N/A;   CARDIAC CATHETERIZATION     CORONARY ANGIOPLASTY WITH STENT PLACEMENT Left 04/12/2021   LAD stent placement   CORONARY PRESSURE/FFR STUDY N/A 04/12/2021   Procedure: INTRAVASCULAR PRESSURE WIRE/FFR STUDY;  Surgeon: Yvonne Kendall, MD;  Location: [facility] INVASIVE CV LAB;  Service: Cardiovascular;  Laterality: N/A;   CORONARY STENT INTERVENTION N/A 04/12/2021   Procedure: CORONARY STENT INTERVENTION;  Surgeon: Yvonne Kendall, MD;  Location: [facility] INVASIVE CV LAB;  Service: Cardiovascular;  Laterality: N/A;   CORONARY ULTRASOUND/IVUS N/A 04/12/2021   Procedure:  Intravascular Ultrasound/IVUS;  Surgeon: Yvonne Kendall, MD;  Location: [facility] INVASIVE CV LAB;  Service: Cardiovascular;  Laterality: N/A;   CORONARY/GRAFT ACUTE MI REVASCULARIZATION N/A 10/04/2020   Procedure: Coronary/Graft Acute MI Revascularization;  Surgeon: Yvonne Kendall, MD;  Location: [facility] INVASIVE CV LAB;  Service: Cardiovascular;  Laterality: N/A;   HERNIA REPAIR     LEFT HEART CATH AND CORONARY ANGIOGRAPHY N/A 10/04/2020   Procedure: LEFT HEART CATH AND CORONARY ANGIOGRAPHY;  Surgeon: Yvonne Kendall, MD;  Location: [facility] INVASIVE CV LAB;  Service: Cardiovascular;  Laterality: N/A;   RIGHT/LEFT HEART CATH AND CORONARY ANGIOGRAPHY N/A 04/12/2021   Procedure: RIGHT/LEFT HEART CATH AND CORONARY ANGIOGRAPHY;  Surgeon: Yvonne Kendall, MD;  Location: [facility] INVASIVE CV LAB;  Service: Cardiovascular;  Laterality: N/A;   Patient Active Problem List   Diagnosis Date Noted   Anemia    Leukocytosis    Right arm weakness 01/25/2022   Right sided weakness    Fall at home 01/22/2022   Status post biventricular pacemaker - MRI compatible PPM and leads 01/22/2022   Chronic indwelling Foley catheter 01/22/2022   CKD (chronic kidney disease), stage III - IV (HCC) 06/28/2021   Chronic HFrEF (heart failure with reduced ejection fraction) (HCC) 03/31/2021   Arrhythmia    DM (diabetes mellitus), type 2 (HCC) new 10/26/2020   Urinary retention 10/26/2020   Hyperlipidemia LDL goal <70 10/26/2020   Coronary artery disease of native artery of native heart with stable angina pectoris (HCC) 10/26/2020   S/P angioplasty with stent 10/04/20 DES to LCX  10/26/2020   Ischemic cardiomyopathy 10/26/2020   Diabetes mellitus, type 2 (HCC) 10/06/2020   DOE (dyspnea on exertion)    Meningioma (HCC) 08/14/2020   Vestibular schwannoma (HCC) 08/14/2020   Cardiac arrest with ventricular fibrillation (HCC)    Pain of left hip joint 05/29/2020   Benign brain tumor (HCC)    Chronic kidney disease    History of pulmonary  embolus (PE)    Hypertension    Nonsustained ventricular tachycardia (HCC) 10/11/2019   Dizziness 10/11/2019   Abnormal echocardiogram 06/07/2019   Eustachian tube dysfunction 08/24/2018   Conductive hearing loss of both ears 08/24/2018   Pulmonary hypertension due to thromboembolism (HCC) 03/09/2018   Solitary pulmonary nodule on lung CT 03/08/2018   Ascending aortic aneurysm (HCC) 4.2 cm based on CT from December 2020 01/29/2018   Sinus bradycardia 12/22/2017   Left bundle branch block 12/19/2017   Personal history of pulmonary embolism - no longer on anticoagulation due to pt refusal to take it anymore. 12/19/2017   Chronic kidney disease, stage III (moderate) (HCC) 12/19/2017   Benign neoplasm of brain (HCC) 12/19/2017    PCP: Gweneth Dimitri, MD  REFERRING PROVIDER: Abram Sander DIAG:  [phone] (ICD-10-CM) - Left hip pain  M25.551 (ICD-10-CM) -  Pain in right hip  M54.2 (ICD-10-CM) - Cervicalgia    THERAPY DIAG:  Unsteadiness on feet  Muscle weakness (generalized)  Right sided weakness  Repeated falls  Rationale for Evaluation and Treatment: Rehabilitation  ONSET DATE: 03/20/23  SUBJECTIVE:   SUBJECTIVE STATEMENT: "Cold be better". Will be seeing neurologist soon soon to address R side numbness. Chest pain has improved   PERTINENT HISTORY: Per previous therapy notes: left parafalcine meningioma. Frequent falls, R RC injury  PAIN:  Are you having pain? Yes: NPRS scale: 0/10 Pain location: R knee pain-distal/lat/ant to knee. He also reports B hip pain and back Pain description: sharp Aggravating factors: Hurts the most at night Relieving factors: Salon Pase  PRECAUTIONS: Fall  RED FLAGS: None   WEIGHT BEARING RESTRICTIONS: No  FALLS:  Has patient fallen in last 6 months? No  LIVING ENVIRONMENT: Lives with: lives with their family Lives in: House/apartment Stairs: Yes: External: 7 steps; bilateral but cannot reach both Has following equipment  at home: Single point cane, Walker - 2 wheeled, and shower chair  OCCUPATION: Retired  PLOF: Independent  PATIENT GOALS: Improve his ROM in his joints without pain.  NEXT MD VISIT: This afternoon  OBJECTIVE:   DIAGNOSTIC FINDINGS:  CT Lumbar- 3. Moderate degenerative spinal stenosis at L2-3. CT CERVICAL IMPRESSION: 1. No evidence of intracranial injury. 2. Negative for cervical spine fracture but there is prevertebral edema at C4-5 suggesting soft tissue injury. 3. Meningiomas with left cerebral of edema, reference brain MRI 01/22/2022.  COGNITION: Overall cognitive status: Within functional limits for tasks assessed     SENSATION: Patient reports numbness in entire R U and LE  EDEMA:  Patient denies  MUSCLE LENGTH: Hamstrings: B HS 70 Thomas test: B hip flexor tightness  POSTURE: rounded shoulders, forward head, decreased lumbar lordosis, and decreased thoracic kyphosis  PALPATION: TTP along B lumbar paraspinals and QL near iliac crests  CERVICAL ROM:  < 50% in all directions with muscle pull at end range  LUMBAR ROM: 50/70% in all directions with pain when sidebending to L and rotating to R.   LOWER EXTREMITY ROM: B hip ROM limited mildly in all planes, general stiffness in all joints, but WFL  LOWER EXTREMITY MMT: 4-(4-)/5, throughout, shaky with decreased control and clonic movements  LOWER EXTREMITY SPECIAL TESTS:  Hip special tests: Luisa Hart (FABER) test: positive  and Hip scouring test: negative Knee special tests: Patellafemoral apprehension test: positive  R knee started to sublux with minimal lateral press, causing pain   FUNCTIONAL TESTS:  5 times sit to stand: TBD Timed up and go (TUG): TBD  GAIT: Distance walked: In Clinic Distances Assistive device utilized: None Level of assistance: Complete Independence Comments: Patient appeared much more stable when entering. Rose from chair and walked back with no sway or unsteadiness. However, later in  the assessment he experienced unsteadiness when rising from chair.   TODAY'S TREATMENT:  DATE:  05/01/23 Bike L 3 x6 min Resisted gait 30lb 4 way x 4 each HS curls 35lb 2x15 Leg Ext 10lb 2x15 Step ups from airex to 6in box, very difficult so removed airex. X10 each seated rest in between sets  Shoulder Ext for posture 10lb 2x10 Heel raises 2x15  04/26/23 Bike L4 x 6 minutes, rest, then 2 x 30 sec at L6 focusing on circles. Attempted side stepping on airex plank, but ankles were too unstable and shaky Standing heel raises on step x 12 Standing toe raises x 12 Squat walking x 80' to increase challenge to control in BLE Seated HS curls, 35#, 2 x 15 Seated knee ext, 20#, 2 x 15 reps Still unable to balance on the airex beam Proprioception assessment for ankles was neg for any deficit. Upside down BOSU in parallel bars-very shaky and unstable, did not improve with time.  04/24/23 Bike L4 x 6 minutes Knee flexion, 35#, 2 x 15 reps Knee ext, 20#, 2 x 15 reps. Standing on Airex pad, weight shifts-lat and ant/post, mini squats. B side step onto and off the pad. All performed with back to the wall for safety, not touching. Ambulation x 80' with 6# weight in each hand, then again, with 3# weight on each ankle to increase proprioceptive feedback. No change with hand weights, but slightly less muscle quivering and clonic activity with weights on ankle. Leg press, 40#, BLE slow movement, 3 x 10 reps Heel raise on leg press, 40#, 2 x 10 reps.  04/12/23 NuStep L5 x 6 minutes Low back muscular assessment- No bruising or TTP, stiffness and soreness with mobility. Attempted to lie down, but patient reported too much pain in his chest.  04/10/23 Bike L3.5 x 7 min Resisted gait 30lb 4 way x 3 each HS curls 35lb 2x15 Leg Ext 10lb 2x15 S2S elevated mat 2x5, then LE's on airex  2x5 ALT 6in box taps x10, then fro airex x10  Rows and Ext green on airex 2x10 each  04/03/23 TUG 12 sec 5x STS 14.36 sec NuStep L5 x 6 mins DLS 10 sec DLS on airex 30 sec x3 Step ups onto airex in // bars CGA x10 each Step ups 4 in x10; 6 in x10; mod assist +2 Lateral step ups 6 in with R x5; mod assist +2; pt. had dizziness  03/30/23 Education   PATIENT EDUCATION:  Education details: POC Person educated: Patient Education method: Explanation Education comprehension: verbalized understanding  HOME EXERCISE PROGRAM: [id number]-from previous round of PT  ASSESSMENT:  CLINICAL IMPRESSION: He clonic muscle activity continues, particularly in ankles, causing instability in standing and gait. Standing was difficult at times for patient. He had a difficult with step ups initiating movement. Tactile cue to keep shoulders down required with shoulder Ext.   Cues not to pull with arms with heel raises. Increase fatigue with activity.  OBJECTIVE IMPAIRMENTS: Abnormal gait, decreased activity tolerance, decreased balance, decreased coordination, decreased mobility, difficulty walking, decreased ROM, decreased strength, impaired flexibility, and pain.   ACTIVITY LIMITATIONS: carrying, lifting, bending, standing, squatting, stairs, transfers, and locomotion level  PARTICIPATION LIMITATIONS: meal prep, cleaning, laundry, shopping, and community activity  PERSONAL FACTORS: Past/current experiences and 1 comorbidity: Memingioma  are also affecting patient's functional outcome.   REHAB POTENTIAL: Good  CLINICAL DECISION MAKING: Evolving/moderate complexity  EVALUATION COMPLEXITY: Moderate   GOALS: Goals reviewed with patient? Yes  SHORT TERM GOALS: Target date: 04/12/23 I with initial HEP updates Baseline: Goal status: 04/26/23- Has previous program.  met  LONG TERM GOALS: Target date: 06/08/23  I with final HEP Baseline:  Goal status: INITIAL  2.  Complete TUG in < 15  sec Baseline:  Goal status: Met 04/03/23  3.  Complete 5 x STS in < 15 sec Baseline:  /Goal status: Met 04/03/23  4.  Pati/ent will return to walking program st home, able to walk x at least 20 minutes on level/unlevel surfaces MI. Baseline: Has not been walking due to pain and unsteadiness. Goal status: 04/24/23 Walking about 500-800 steps inside home-ongoing.  5.  Patient will report improved pain in any of his joints or back to <3/10 Baseline:  Goal status: INITIAL  PLAN:  PT FREQUENCY: 1-2x/week  PT DURATION: 10 weeks  PLANNED INTERVENTIONS: Therapeutic exercises, Therapeutic activity, Neuromuscular re-education, Balance training, Gait training, Patient/Family education, Self Care, Joint mobilization, Stair training, Dry Needling, Electrical stimulation, Cryotherapy, Moist heat, Taping, Vasopneumatic device, Ionotophoresis 4mg /ml Dexamethasone, and Manual therapy  PLAN FOR NEXT SESSION: 04/26/23-Assess kneeling for clonic activity.  Iona Beard, DPT,  05/01/2023, 11:02 AM

## 2023-05-02 ENCOUNTER — Other Ambulatory Visit (HOSPITAL_COMMUNITY): Payer: Self-pay | Admitting: Family Medicine

## 2023-05-02 DIAGNOSIS — D320 Benign neoplasm of cerebral meninges: Secondary | ICD-10-CM

## 2023-05-02 DIAGNOSIS — R202 Paresthesia of skin: Secondary | ICD-10-CM

## 2023-05-02 DIAGNOSIS — R29898 Other symptoms and signs involving the musculoskeletal system: Secondary | ICD-10-CM

## 2023-05-02 DIAGNOSIS — G9589 Other specified diseases of spinal cord: Secondary | ICD-10-CM

## 2023-05-03 ENCOUNTER — Encounter: Payer: Self-pay | Admitting: Physical Therapy

## 2023-05-03 ENCOUNTER — Ambulatory Visit: Payer: Medicare Other | Admitting: Physical Therapy

## 2023-05-03 DIAGNOSIS — R29898 Other symptoms and signs involving the musculoskeletal system: Secondary | ICD-10-CM | POA: Diagnosis not present

## 2023-05-03 DIAGNOSIS — R2681 Unsteadiness on feet: Secondary | ICD-10-CM

## 2023-05-03 DIAGNOSIS — R531 Weakness: Secondary | ICD-10-CM | POA: Diagnosis not present

## 2023-05-03 DIAGNOSIS — D329 Benign neoplasm of meninges, unspecified: Secondary | ICD-10-CM | POA: Diagnosis not present

## 2023-05-03 DIAGNOSIS — M6281 Muscle weakness (generalized): Secondary | ICD-10-CM

## 2023-05-03 DIAGNOSIS — R296 Repeated falls: Secondary | ICD-10-CM | POA: Diagnosis not present

## 2023-05-03 NOTE — Therapy (Signed)
OUTPATIENT PHYSICAL THERAPY LOWER EXTREMITY EVALUATION   Patient Name: Timothy Moran MRN: [id number] DOB:[birth year], 77 y.o., male Today's Date: 05/03/2023  END OF SESSION:  PT End of Session - 05/03/23 1059     Visit Number 8    Date for PT Re-Evaluation 06/08/23    PT Start Time 1100    PT Stop Time 1145    PT Time Calculation (min) 45 min    Activity Tolerance Patient tolerated treatment well    Behavior During Therapy [facility] for tasks assessed/performed               Past Medical History:  Diagnosis Date   Acute blood loss anemia 10/05/2020   Acute respiratory failure (HCC), hypothermia therapy, vent - extubated 10/06/20    AKI (acute kidney injury) (HCC) 10/05/2020   Anoxic brain injury (HCC) 10/26/2020   Arrhythmia    Ascending aortic aneurysm (HCC) 01/29/2018   a. 2019 4.3 cm by echo; b. 02/2021 Echo: Ao root 40mm.   Benign brain tumor (HCC)    Benign neoplasm of brain (HCC) 12/19/2017   Cardiac arrest with ventricular fibrillation (HCC)    Chest pain 12/04/2017   Chronic kidney disease, stage III (moderate) (HCC) 12/19/2017   CKD (chronic kidney disease), stage III - IV (HCC)    Conductive hearing loss of both ears 08/24/2018   Coronary Artery Disease    a. 09/2020 Inf STEMI complicated by cardiac arrest/PCI: LAD 50p/m, LCX 100d (2.5 x 30 Resolute Onyx DES); b. 04/2021 PCI: 04/2021 LM nl, LAD 50p/75m (2.5x30 Onyx Frontier DES), LCX 25p, 44m (RFR 0.98), 40d ISR (RFR 0.98), OM2 25, RCA mild diff dzs.   Diabetes mellitus, type 2 (HCC) 10/06/2020   10/06/20 A1C 7%   Dizziness 10/11/2019   Encounter for imaging study to confirm orogastric (OG) tube placement    Eustachian tube dysfunction 08/24/2018   Hematuria 10/26/2020   HFrEF (heart failure with reduced ejection fraction) (HCC)    a. 11/2020 Echo: EF 25-30%; b. 02/2021 Echo: EF 25-30%, glob HK. Mild LVH. Nl RV fxn. Triv AI. Ao root 40mm.   History of pulmonary embolus (PE)    HLD (hyperlipidemia) 10/26/2020    Hypertension    Iron deficiency anemia rec'd IV iron 10/26/2020   Ischemic cardiomyopathy    a. 11/2020 Echo: EF 25-30%; b. 02/2021 Echo: EF 25-30%, glob HK.   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 Marland KitchenSpirometry 03/08/2018    FEV1 3.86 (108%)  Ratio 96 s prior rx  - PET  03/13/18   Low grade c/w adenoca > rec T surgery eval     STEMI (ST elevation myocardial infarction) (HCC) 10/04/2020   Urinary retention 10/26/2020   Vestibular schwannoma (HCC) 08/14/2020   Past Surgical History:  Procedure Laterality Date   APPENDECTOMY     BIV  ICD INSERTION CRT-D N/A 11/03/2021   Procedure: BIV ICD INSERTION CRT-D;  Surgeon: Regan Lemming, MD;  [facility] INVASIVE CV LAB;  Service: Cardiovascular;  Laterality: N/A;   CARDIAC CATHETERIZATION     CORONARY ANGIOPLASTY WITH STENT PLACEMENT Left 04/12/2021   LAD stent placement   CORONARY PRESSURE/FFR STUDY N/A 04/12/2021   Procedure: INTRAVASCULAR PRESSURE WIRE/FFR STUDY;  Surgeon: Yvonne Kendall, MD;  Location: [facility] INVASIVE CV LAB;  Service: Cardiovascular;  Laterality: N/A;   CORONARY STENT INTERVENTION N/A 04/12/2021   Procedure: CORONARY STENT INTERVENTION;  Surgeon: Yvonne Kendall, MD;  Location: [facility] INVASIVE CV LAB;  Service: Cardiovascular;  Laterality: N/A;   CORONARY ULTRASOUND/IVUS N/A 04/12/2021   Procedure:  Intravascular Ultrasound/IVUS;  Surgeon: Yvonne Kendall, MD;  Location: [facility] INVASIVE CV LAB;  Service: Cardiovascular;  Laterality: N/A;   CORONARY/GRAFT ACUTE MI REVASCULARIZATION N/A 10/04/2020   Procedure: Coronary/Graft Acute MI Revascularization;  Surgeon: Yvonne Kendall, MD;  Location: [facility] INVASIVE CV LAB;  Service: Cardiovascular;  Laterality: N/A;   HERNIA REPAIR     LEFT HEART CATH AND CORONARY ANGIOGRAPHY N/A 10/04/2020   Procedure: LEFT HEART CATH AND CORONARY ANGIOGRAPHY;  Surgeon: Yvonne Kendall, MD;  Location: [facility] INVASIVE CV LAB;  Service: Cardiovascular;  Laterality: N/A;   RIGHT/LEFT HEART CATH AND CORONARY ANGIOGRAPHY N/A 04/12/2021   Procedure: RIGHT/LEFT HEART CATH AND CORONARY ANGIOGRAPHY;  Surgeon: Yvonne Kendall, MD;  Location: [facility] INVASIVE CV LAB;  Service: Cardiovascular;  Laterality: N/A;   Patient Active Problem List   Diagnosis Date Noted   Anemia    Leukocytosis    Right arm weakness 01/25/2022   Right sided weakness    Fall at home 01/22/2022   Status post biventricular pacemaker - MRI compatible PPM and leads 01/22/2022   Chronic indwelling Foley catheter 01/22/2022   CKD (chronic kidney disease), stage III - IV (HCC) 06/28/2021   Chronic HFrEF (heart failure with reduced ejection fraction) (HCC) 03/31/2021   Arrhythmia    DM (diabetes mellitus), type 2 (HCC) new 10/26/2020   Urinary retention 10/26/2020   Hyperlipidemia LDL goal <70 10/26/2020   Coronary artery disease of native artery of native heart with stable angina pectoris (HCC) 10/26/2020   S/P angioplasty with stent 10/04/20 DES to LCX  10/26/2020   Ischemic cardiomyopathy 10/26/2020   Diabetes mellitus, type 2 (HCC) 10/06/2020   DOE (dyspnea on exertion)    Meningioma (HCC) 08/14/2020   Vestibular schwannoma (HCC) 08/14/2020   Cardiac arrest with ventricular fibrillation (HCC)    Pain of left hip joint 05/29/2020   Benign brain tumor (HCC)    Chronic kidney disease    History of pulmonary  embolus (PE)    Hypertension    Nonsustained ventricular tachycardia (HCC) 10/11/2019   Dizziness 10/11/2019   Abnormal echocardiogram 06/07/2019   Eustachian tube dysfunction 08/24/2018   Conductive hearing loss of both ears 08/24/2018   Pulmonary hypertension due to thromboembolism (HCC) 03/09/2018   Solitary pulmonary nodule on lung CT 03/08/2018   Ascending aortic aneurysm (HCC) 4.2 cm based on CT from December 2020 01/29/2018   Sinus bradycardia 12/22/2017   Left bundle branch block 12/19/2017   Personal history of pulmonary embolism - no longer on anticoagulation due to pt refusal to take it anymore. 12/19/2017   Chronic kidney disease, stage III (moderate) (HCC) 12/19/2017   Benign neoplasm of brain (HCC) 12/19/2017    PCP: Gweneth Dimitri, MD  REFERRING PROVIDER: Abram Sander DIAG:  [phone] (ICD-10-CM) - Left hip pain  M25.551 (ICD-10-CM) -  Pain in right hip  M54.2 (ICD-10-CM) - Cervicalgia    THERAPY DIAG:  Muscle weakness (generalized)  Right sided weakness  Unsteadiness on feet  Rationale for Evaluation and Treatment: Rehabilitation  ONSET DATE: 03/20/23  SUBJECTIVE:   SUBJECTIVE STATEMENT: Can't complain   PERTINENT HISTORY: Per previous therapy notes: left parafalcine meningioma. Frequent falls, R RC injury  PAIN:  Are you having pain? Yes: NPRS scale: 0/10 Pain location: R knee pain-distal/lat/ant to knee. He also reports B hip pain and back Pain description: sharp Aggravating factors: Hurts the most at night Relieving factors: Salon Pase  PRECAUTIONS: Fall  RED FLAGS: None   WEIGHT BEARING RESTRICTIONS: No  FALLS:  Has patient fallen in last 6 months? No  LIVING ENVIRONMENT: Lives with: lives with their family Lives in: House/apartment Stairs: Yes: External: 7 steps; bilateral but cannot reach both Has following equipment at home: Single point cane, Walker - 2 wheeled, and shower chair  OCCUPATION: Retired  PLOF:  Independent  PATIENT GOALS: Improve his ROM in his joints without pain.  NEXT MD VISIT: This afternoon  OBJECTIVE:   DIAGNOSTIC FINDINGS:  CT Lumbar- 3. Moderate degenerative spinal stenosis at L2-3. CT CERVICAL IMPRESSION: 1. No evidence of intracranial injury. 2. Negative for cervical spine fracture but there is prevertebral edema at C4-5 suggesting soft tissue injury. 3. Meningiomas with left cerebral of edema, reference brain MRI 01/22/2022.  COGNITION: Overall cognitive status: Within functional limits for tasks assessed     SENSATION: Patient reports numbness in entire R U and LE  EDEMA:  Patient denies  MUSCLE LENGTH: Hamstrings: B HS 70 Thomas test: B hip flexor tightness  POSTURE: rounded shoulders, forward head, decreased lumbar lordosis, and decreased thoracic kyphosis  PALPATION: TTP along B lumbar paraspinals and QL near iliac crests  CERVICAL ROM:  < 50% in all directions with muscle pull at end range  LUMBAR ROM: 50/70% in all directions with pain when sidebending to L and rotating to R.   LOWER EXTREMITY ROM: B hip ROM limited mildly in all planes, general stiffness in all joints, but WFL  LOWER EXTREMITY MMT: 4-(4-)/5, throughout, shaky with decreased control and clonic movements  LOWER EXTREMITY SPECIAL TESTS:  Hip special tests: Luisa Hart (FABER) test: positive  and Hip scouring test: negative Knee special tests: Patellafemoral apprehension test: positive  R knee started to sublux with minimal lateral press, causing pain   FUNCTIONAL TESTS:  5 times sit to stand: TBD Timed up and go (TUG): TBD  GAIT: Distance walked: In Clinic Distances Assistive device utilized: None Level of assistance: Complete Independence Comments: Patient appeared much more stable when entering. Rose from chair and walked back with no sway or unsteadiness. However, later in the assessment he experienced unsteadiness when rising from chair.   TODAY'S TREATMENT:  DATE:  05/03/23 Bike L 3.5 x6 min 6in step ups x10 each  Resisted side step 30lb x5 stopped on the last rep due to pt becoming unstable  HS curls 35lb 2x12 Leg Ext 10lb 2x15  05/01/23 Bike L 3 x6 min Resisted gait 30lb 4 way x 4 each HS curls 35lb 2x15 Leg Ext 10lb 2x15 Step ups from airex to 6in box, very difficult so removed airex. X10 each seated rest in between sets  Shoulder Ext for posture 10lb 2x10 Heel raises 2x15  04/26/23 Bike L4 x 6 minutes, rest, then 2 x 30 sec at L6 focusing on circles. Attempted side stepping on airex plank, but ankles were too unstable and shaky Standing heel raises on step x 12 Standing toe raises x 12 Squat walking x 80' to increase challenge to control in BLE Seated HS curls, 35#, 2 x 15 Seated knee ext, 20#, 2 x 15 reps Still unable to balance on the airex beam Proprioception assessment for ankles was neg for any deficit. Upside down BOSU in parallel bars-very shaky and unstable, did not improve with time.  04/24/23 Bike L4 x 6 minutes Knee flexion, 35#, 2 x 15 reps Knee ext, 20#, 2 x 15 reps. Standing on Airex pad, weight shifts-lat and ant/post, mini squats. B side step onto and off the pad. All performed with back to the wall for safety, not touching. Ambulation x 80' with 6# weight in each hand, then again, with 3# weight on each ankle to increase proprioceptive feedback. No change with hand weights, but slightly less muscle quivering and clonic activity with weights on ankle. Leg press, 40#, BLE slow movement, 3 x 10 reps Heel raise on leg press, 40#, 2 x 10 reps.  04/12/23 NuStep L5 x 6 minutes Low back muscular assessment- No bruising or TTP, stiffness and soreness with mobility. Attempted to lie down, but patient reported too much pain in his chest.  04/10/23 Bike L3.5 x 7 min Resisted gait 30lb 4 way x 3 each HS  curls 35lb 2x15 Leg Ext 10lb 2x15 S2S elevated mat 2x5, then LE's on airex 2x5 ALT 6in box taps x10, then fro airex x10  Rows and Ext green on airex 2x10 each  04/03/23 TUG 12 sec 5x STS 14.36 sec NuStep L5 x 6 mins DLS 10 sec DLS on airex 30 sec x3 Step ups onto airex in // bars CGA x10 each Step ups 4 in x10; 6 in x10; mod assist +2 Lateral step ups 6 in with R x5; mod assist +2; pt. had dizziness  03/30/23 Education   PATIENT EDUCATION:  Education details: POC Person educated: Patient Education method: Explanation Education comprehension: verbalized understanding  HOME EXERCISE PROGRAM: [id number]-from previous round of PT  ASSESSMENT:  CLINICAL IMPRESSION: He clonic muscle activity continues, particularly in ankles, causing instability in standing and gait. Standing was difficult at times for patient. He had a difficult with step ups initiating movement. Session cut short due to pt instability and he not know what was going on. Had to stop during resisted gait due to instability.  Multiple bouts of LOB with step ups requiring min assist.  OBJECTIVE IMPAIRMENTS: Abnormal gait, decreased activity tolerance, decreased balance, decreased coordination, decreased mobility, difficulty walking, decreased ROM, decreased strength, impaired flexibility, and pain.   ACTIVITY LIMITATIONS: carrying, lifting, bending, standing, squatting, stairs, transfers, and locomotion level  PARTICIPATION LIMITATIONS: meal prep, cleaning, laundry, shopping, and community activity  PERSONAL FACTORS: Past/current experiences and 1 comorbidity: Memingioma  are  also affecting patient's functional outcome.   REHAB POTENTIAL: Good  CLINICAL DECISION MAKING: Evolving/moderate complexity  EVALUATION COMPLEXITY: Moderate   GOALS: Goals reviewed with patient? Yes  SHORT TERM GOALS: Target date: 04/12/23 I with initial HEP updates Baseline: Goal status: 04/26/23- Has previous program. met  LONG TERM  GOALS: Target date: 06/08/23  I with final HEP Baseline:  Goal status: INITIAL  2.  Complete TUG in < 15 sec Baseline:  Goal status: Met 04/03/23  3.  Complete 5 x STS in < 15 sec Baseline:  /Goal status: Met 04/03/23  4.  Pati/ent will return to walking program st home, able to walk x at least 20 minutes on level/unlevel surfaces MI. Baseline: Has not been walking due to pain and unsteadiness. Goal status: 04/24/23 Walking about 500-800 steps inside home-ongoing.  5.  Patient will report improved pain in any of his joints or back to <3/10 Baseline:  Goal status: INITIAL  PLAN:  PT FREQUENCY: 1-2x/week  PT DURATION: 10 weeks  PLANNED INTERVENTIONS: Therapeutic exercises, Therapeutic activity, Neuromuscular re-education, Balance training, Gait training, Patient/Family education, Self Care, Joint mobilization, Stair training, Dry Needling, Electrical stimulation, Cryotherapy, Moist heat, Taping, Vasopneumatic device, Ionotophoresis 4mg /ml Dexamethasone, and Manual therapy  PLAN FOR NEXT SESSION: 04/26/23-Assess kneeling for clonic activity.  Iona Beard, DPT,  05/03/2023, 10:59 AM

## 2023-05-08 ENCOUNTER — Encounter: Payer: Self-pay | Admitting: Physical Therapy

## 2023-05-08 ENCOUNTER — Ambulatory Visit: Payer: Medicare Other | Admitting: Physical Therapy

## 2023-05-08 DIAGNOSIS — R2681 Unsteadiness on feet: Secondary | ICD-10-CM

## 2023-05-08 DIAGNOSIS — R531 Weakness: Secondary | ICD-10-CM

## 2023-05-08 DIAGNOSIS — D329 Benign neoplasm of meninges, unspecified: Secondary | ICD-10-CM

## 2023-05-08 DIAGNOSIS — R29898 Other symptoms and signs involving the musculoskeletal system: Secondary | ICD-10-CM

## 2023-05-08 DIAGNOSIS — M6281 Muscle weakness (generalized): Secondary | ICD-10-CM | POA: Diagnosis not present

## 2023-05-08 DIAGNOSIS — R296 Repeated falls: Secondary | ICD-10-CM | POA: Diagnosis not present

## 2023-05-08 NOTE — Therapy (Signed)
OUTPATIENT PHYSICAL THERAPY LOWER EXTREMITY EVALUATION   Patient Name: Timothy Moran MRN: [id number] DOB:1946/05/10, 77 y.o., male Today's Date: 05/08/2023  END OF SESSION:  PT End of Session - 05/08/23 1106     Visit Number 9    Date for PT Re-Evaluation 06/08/23    PT Start Time 1100    PT Stop Time 1140    PT Time Calculation (min) 40 min    Activity Tolerance Patient tolerated treatment well    Behavior During Therapy [facility] for tasks assessed/performed                Past Medical History:  Diagnosis Date   Acute blood loss anemia 10/05/2020   Acute respiratory failure (HCC), hypothermia therapy, vent - extubated 10/06/20    AKI (acute kidney injury) (HCC) 10/05/2020   Anoxic brain injury (HCC) 10/26/2020   Arrhythmia    Ascending aortic aneurysm (HCC) 01/29/2018   a. 2019 4.3 cm by echo; b. 02/2021 Echo: Ao root 40mm.   Benign brain tumor (HCC)    Benign neoplasm of brain (HCC) 12/19/2017   Cardiac arrest with ventricular fibrillation (HCC)    Chest pain 12/04/2017   Chronic kidney disease, stage III (moderate) (HCC) 12/19/2017   CKD (chronic kidney disease), stage III - IV (HCC)    Conductive hearing loss of both ears 08/24/2018   Coronary Artery Disease    a. 09/2020 Inf STEMI complicated by cardiac arrest/PCI: LAD 50p/m, LCX 100d (2.5 x 30 Resolute Onyx DES); b. 04/2021 PCI: 04/2021 LM nl, LAD 50p/26m (2.5x30 Onyx Frontier DES), LCX 25p, 28m (RFR 0.98), 40d ISR (RFR 0.98), OM2 25, RCA mild diff dzs.   Diabetes mellitus, type 2 (HCC) 10/06/2020   10/06/20 A1C 7%   Dizziness 10/11/2019   Encounter for imaging study to confirm orogastric (OG) tube placement    Eustachian tube dysfunction 08/24/2018   Hematuria 10/26/2020   HFrEF (heart failure with reduced ejection fraction) (HCC)    a. 11/2020 Echo: EF 25-30%; b. 02/2021 Echo: EF 25-30%, glob HK. Mild LVH. Nl RV fxn. Triv AI. Ao root 40mm.   History of pulmonary embolus (PE)    HLD (hyperlipidemia) 10/26/2020    Hypertension    Iron deficiency anemia rec'd IV iron 10/26/2020   Ischemic cardiomyopathy    a. 11/2020 Echo: EF 25-30%; b. 02/2021 Echo: EF 25-30%, glob HK.   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 Marland KitchenSpirometry 03/08/2018    FEV1 3.86 (108%)  Ratio 96 s prior rx  - PET  03/13/18   Low grade c/w adenoca > rec T surgery eval     STEMI (ST elevation myocardial infarction) (HCC) 10/04/2020   Urinary retention 10/26/2020   Vestibular schwannoma (HCC) 08/14/2020   Past Surgical History:  Procedure Laterality Date   APPENDECTOMY  BIV ICD INSERTION CRT-D N/A 11/03/2021   Procedure: BIV ICD INSERTION CRT-D;  Surgeon: Regan Lemming, MD;  Location: [facility] INVASIVE CV LAB;  Service: Cardiovascular;  Laterality: N/A;   CARDIAC CATHETERIZATION     CORONARY ANGIOPLASTY WITH STENT PLACEMENT Left 04/12/2021   LAD stent placement   CORONARY PRESSURE/FFR STUDY N/A 04/12/2021   Procedure: INTRAVASCULAR PRESSURE WIRE/FFR STUDY;  Surgeon: Yvonne Kendall, MD;  Location: [facility] INVASIVE CV LAB;  Service: Cardiovascular;  Laterality: N/A;   CORONARY STENT INTERVENTION N/A 04/12/2021   Procedure: CORONARY STENT INTERVENTION;  Surgeon: Yvonne Kendall, MD;  Location: [facility] INVASIVE CV LAB;  Service: Cardiovascular;  Laterality: N/A;   CORONARY ULTRASOUND/IVUS N/A 04/12/2021   Procedure:  Intravascular Ultrasound/IVUS;  Surgeon: Yvonne Kendall, MD;  Location: [facility] INVASIVE CV LAB;  Service: Cardiovascular;  Laterality: N/A;   CORONARY/GRAFT ACUTE MI REVASCULARIZATION N/A 10/04/2020   Procedure: Coronary/Graft Acute MI Revascularization;  Surgeon: Yvonne Kendall, MD;  Location: [facility] INVASIVE CV LAB;  Service: Cardiovascular;  Laterality: N/A;   HERNIA REPAIR     LEFT HEART CATH AND CORONARY ANGIOGRAPHY N/A 10/04/2020   Procedure: LEFT HEART CATH AND CORONARY ANGIOGRAPHY;  Surgeon: Yvonne Kendall, MD;  Location: [facility] INVASIVE CV LAB;  Service: Cardiovascular;  Laterality: N/A;   RIGHT/LEFT HEART CATH AND CORONARY ANGIOGRAPHY N/A 04/12/2021   Procedure: RIGHT/LEFT HEART CATH AND CORONARY ANGIOGRAPHY;  Surgeon: Yvonne Kendall, MD;  Location: [facility] INVASIVE CV LAB;  Service: Cardiovascular;  Laterality: N/A;   Patient Active Problem List   Diagnosis Date Noted   Anemia    Leukocytosis    Right arm weakness 01/25/2022   Right sided weakness    Fall at home 01/22/2022   Status post biventricular pacemaker - MRI compatible PPM and leads 01/22/2022   Chronic indwelling Foley catheter 01/22/2022   CKD (chronic kidney disease), stage III - IV (HCC) 06/28/2021   Chronic HFrEF (heart failure with reduced ejection fraction) (HCC) 03/31/2021   Arrhythmia    DM (diabetes mellitus), type 2 (HCC) new 10/26/2020   Urinary retention 10/26/2020   Hyperlipidemia LDL goal <70 10/26/2020   Coronary artery disease of native artery of native heart with stable angina pectoris (HCC) 10/26/2020   S/P angioplasty with stent 10/04/20 DES to LCX  10/26/2020   Ischemic cardiomyopathy 10/26/2020   Diabetes mellitus, type 2 (HCC) 10/06/2020   DOE (dyspnea on exertion)    Meningioma (HCC) 08/14/2020   Vestibular schwannoma (HCC) 08/14/2020   Cardiac arrest with ventricular fibrillation (HCC)    Pain of left hip joint 05/29/2020   Benign brain tumor (HCC)    Chronic kidney disease    History of pulmonary  embolus (PE)    Hypertension    Nonsustained ventricular tachycardia (HCC) 10/11/2019   Dizziness 10/11/2019   Abnormal echocardiogram 06/07/2019   Eustachian tube dysfunction 08/24/2018   Conductive hearing loss of both ears 08/24/2018   Pulmonary hypertension due to thromboembolism (HCC) 03/09/2018   Solitary pulmonary nodule on lung CT 03/08/2018   Ascending aortic aneurysm (HCC) 4.2 cm based on CT from December 2020 01/29/2018   Sinus bradycardia 12/22/2017   Left bundle branch block 12/19/2017   Personal history of pulmonary embolism - no longer on anticoagulation due to pt refusal to take it anymore. 12/19/2017   Chronic kidney disease, stage III (moderate) (HCC) 12/19/2017   Benign neoplasm of brain (HCC) 12/19/2017    PCP: Gweneth Dimitri, MD  REFERRING PROVIDER: Abram Sander DIAG:  [phone] (ICD-10-CM) - Left hip pain  M25.551 (ICD-10-CM) -  Pain in right hip  M54.2 (ICD-10-CM) - Cervicalgia    THERAPY DIAG:  Muscle weakness (generalized)  Right sided weakness  Unsteadiness on feet  Meningioma (HCC)  Poor body mechanics  Rationale for Evaluation and Treatment: Rehabilitation  ONSET DATE: 03/20/23  SUBJECTIVE:   SUBJECTIVE STATEMENT: Patient reports that the numbness on R side feels like it is getting worse. He feels like this is what is causing his instability. His Dr reports that she is planning a neurologic consult, but he has not heard back. His Dr also reported that the meningioma on his spine has grown and this could be influencing the changes as well.  PERTINENT HISTORY: Per previous therapy notes: left parafalcine meningioma. Frequent falls, R RC injury  PAIN:  Are you having pain? Yes: NPRS scale: 0/10 Pain location: R knee pain-distal/lat/ant to knee. He also reports B hip pain and back Pain description: sharp Aggravating factors: Hurts the most at night Relieving factors: Salon Pase  PRECAUTIONS: Fall  RED FLAGS: None   WEIGHT BEARING  RESTRICTIONS: No  FALLS:  Has patient fallen in last 6 months? No  LIVING ENVIRONMENT: Lives with: lives with their family Lives in: House/apartment Stairs: Yes: External: 7 steps; bilateral but cannot reach both Has following equipment at home: Single point cane, Walker - 2 wheeled, and shower chair  OCCUPATION: Retired  PLOF: Independent  PATIENT GOALS: Improve his ROM in his joints without pain.  NEXT MD VISIT: This afternoon  OBJECTIVE:   DIAGNOSTIC FINDINGS:  CT Lumbar- 3. Moderate degenerative spinal stenosis at L2-3. CT CERVICAL IMPRESSION: 1. No evidence of intracranial injury. 2. Negative for cervical spine fracture but there is prevertebral edema at C4-5 suggesting soft tissue injury. 3. Meningiomas with left cerebral of edema, reference brain MRI 01/22/2022.  COGNITION: Overall cognitive status: Within functional limits for tasks assessed     SENSATION: Patient reports numbness in entire R U and LE  EDEMA:  Patient denies  MUSCLE LENGTH: Hamstrings: B HS 70 Thomas test: B hip flexor tightness  POSTURE: rounded shoulders, forward head, decreased lumbar lordosis, and decreased thoracic kyphosis  PALPATION: TTP along B lumbar paraspinals and QL near iliac crests  CERVICAL ROM:  < 50% in all directions with muscle pull at end range  LUMBAR ROM: 50/70% in all directions with pain when sidebending to L and rotating to R.   LOWER EXTREMITY ROM: B hip ROM limited mildly in all planes, general stiffness in all joints, but WFL  LOWER EXTREMITY MMT: 4-(4-)/5, throughout, shaky with decreased control and clonic movements  LOWER EXTREMITY SPECIAL TESTS:  Hip special tests: Luisa Hart (FABER) test: positive  and Hip scouring test: negative Knee special tests: Patellafemoral apprehension test: positive  R knee started to sublux with minimal lateral press, causing pain   FUNCTIONAL TESTS:  5 times sit to stand: TBD Timed up and go (TUG): TBD  GAIT: Distance  walked: In Clinic Distances Assistive device utilized: None Level of assistance: Complete Independence Comments: Patient appeared much more stable when entering. Rose from chair and walked back with no sway or unsteadiness. However, later in the assessment he experienced unsteadiness when rising from chair.   TODAY'S TREATMENT:  DATE:  05/08/23 Bike L4 x 6 minutes, then 2 x 30 sec at L6 resistance. Sit to stand from slightly elevated mat with chest press of 4# ball at the top of stance. Squats with 10# weight to target 8" off floor. Very unstable on first attempt, fell back into sitting on mat. He was then able to complete 10 reps without additional LOB. Standing on Airex, OHP with 4# ball x 10. Initially very unsteady, required min A to balance, but once recovered, CGA. Repeat with trunk rotation to each side x 10, CGA, occasional instability. Seated knee flex, 35#, 3 x 10 reps Seated knee ext, 10#, 3 x 10 reps Heel raises on step, 2 x 10 reps, BUE support  05/03/23 Bike L 3.5 x6 min 6in step ups x10 each  Resisted side step 30lb x5 stopped on the last rep due to pt becoming unstable  HS curls 35lb 2x12 Leg Ext 10lb 2x15  05/01/23 Bike L 3 x6 min Resisted gait 30lb 4 way x 4 each HS curls 35lb 2x15 Leg Ext 10lb 2x15 Step ups from airex to 6in box, very difficult so removed airex. X10 each seated rest in between sets  Shoulder Ext for posture 10lb 2x10 Heel raises 2x15  04/26/23 Bike L4 x 6 minutes, rest, then 2 x 30 sec at L6 focusing on circles. Attempted side stepping on airex plank, but ankles were too unstable and shaky Standing heel raises on step x 12 Standing toe raises x 12 Squat walking x 80' to increase challenge to control in BLE Seated HS curls, 35#, 2 x 15 Seated knee ext, 20#, 2 x 15 reps Still unable to balance on the airex  beam Proprioception assessment for ankles was neg for any deficit. Upside down BOSU in parallel bars-very shaky and unstable, did not improve with time.  04/24/23 Bike L4 x 6 minutes Knee flexion, 35#, 2 x 15 reps Knee ext, 20#, 2 x 15 reps. Standing on Airex pad, weight shifts-lat and ant/post, mini squats. B side step onto and off the pad. All performed with back to the wall for safety, not touching. Ambulation x 80' with 6# weight in each hand, then again, with 3# weight on each ankle to increase proprioceptive feedback. No change with hand weights, but slightly less muscle quivering and clonic activity with weights on ankle. Leg press, 40#, BLE slow movement, 3 x 10 reps Heel raise on leg press, 40#, 2 x 10 reps.  04/12/23 NuStep L5 x 6 minutes Low back muscular assessment- No bruising or TTP, stiffness and soreness with mobility. Attempted to lie down, but patient reported too much pain in his chest.  04/10/23 Bike L3.5 x 7 min Resisted gait 30lb 4 way x 3 each HS curls 35lb 2x15 Leg Ext 10lb 2x15 S2S elevated mat 2x5, then LE's on airex 2x5 ALT 6in box taps x10, then fro airex x10  Rows and Ext green on airex 2x10 each  04/03/23 TUG 12 sec 5x STS 14.36 sec NuStep L5 x 6 mins DLS 10 sec DLS on airex 30 sec x3 Step ups onto airex in // bars CGA x10 each Step ups 4 in x10; 6 in x10; mod assist +2 Lateral step ups 6 in with R x5; mod assist +2; pt. had dizziness  03/30/23 Education   PATIENT EDUCATION:  Education details: POC Person educated: Patient Education method: Explanation Education comprehension: verbalized understanding  HOME EXERCISE PROGRAM: [id number]-from previous round of PT  ASSESSMENT:  CLINICAL IMPRESSION: He clonic  muscle activity continues, particularly in ankles, causing instability in standing and gait. Treatment emphasized initial standing and strength of LE to try to facilitate improved control. Awaiting neuro consult to come through.  OBJECTIVE  IMPAIRMENTS: Abnormal gait, decreased activity tolerance, decreased balance, decreased coordination, decreased mobility, difficulty walking, decreased ROM, decreased strength, impaired flexibility, and pain.   ACTIVITY LIMITATIONS: carrying, lifting, bending, standing, squatting, stairs, transfers, and locomotion level  PARTICIPATION LIMITATIONS: meal prep, cleaning, laundry, shopping, and community activity  PERSONAL FACTORS: Past/current experiences and 1 comorbidity: Memingioma  are also affecting patient's functional outcome.   REHAB POTENTIAL: Good  CLINICAL DECISION MAKING: Evolving/moderate complexity  EVALUATION COMPLEXITY: Moderate   GOALS: Goals reviewed with patient? Yes  SHORT TERM GOALS: Target date: 04/12/23 I with initial HEP updates Baseline: Goal status: 04/26/23- Has previous program. met  LONG TERM GOALS: Target date: 06/08/23  I with final HEP Baseline:  Goal status: INITIAL  2.  Complete TUG in < 15 sec Baseline:  Goal status: Met 04/03/23  3.  Complete 5 x STS in < 15 sec Baseline:  /Goal status: Met 04/03/23  4.  Pati/ent will return to walking program st home, able to walk x at least 20 minutes on level/unlevel surfaces MI. Baseline: Has not been walking due to pain and unsteadiness. Goal status: 04/24/23 Walking about 500-800 steps inside home-ongoing.  5.  Patient will report improved pain in any of his joints or back to <3/10 Baseline:  Goal status: INITIAL  PLAN:  PT FREQUENCY: 1-2x/week  PT DURATION: 10 weeks  PLANNED INTERVENTIONS: Therapeutic exercises, Therapeutic activity, Neuromuscular re-education, Balance training, Gait training, Patient/Family education, Self Care, Joint mobilization, Stair training, Dry Needling, Electrical stimulation, Cryotherapy, Moist heat, Taping, Vasopneumatic device, Ionotophoresis 4mg /ml Dexamethasone, and Manual therapy  PLAN FOR NEXT SESSION: 04/26/23-Assess kneeling for clonic activity.  Iona Beard,  DPT,  05/08/2023, 11:40 AM

## 2023-05-09 ENCOUNTER — Ambulatory Visit (INDEPENDENT_AMBULATORY_CARE_PROVIDER_SITE_OTHER): Payer: Medicare Other

## 2023-05-09 DIAGNOSIS — I255 Ischemic cardiomyopathy: Secondary | ICD-10-CM | POA: Diagnosis not present

## 2023-05-09 DIAGNOSIS — I447 Left bundle-branch block, unspecified: Secondary | ICD-10-CM

## 2023-05-10 ENCOUNTER — Encounter: Payer: Self-pay | Admitting: Physical Therapy

## 2023-05-10 ENCOUNTER — Ambulatory Visit: Payer: Medicare Other | Admitting: Physical Therapy

## 2023-05-10 DIAGNOSIS — M6281 Muscle weakness (generalized): Secondary | ICD-10-CM

## 2023-05-10 DIAGNOSIS — R29898 Other symptoms and signs involving the musculoskeletal system: Secondary | ICD-10-CM | POA: Diagnosis not present

## 2023-05-10 DIAGNOSIS — R531 Weakness: Secondary | ICD-10-CM | POA: Diagnosis not present

## 2023-05-10 DIAGNOSIS — D329 Benign neoplasm of meninges, unspecified: Secondary | ICD-10-CM | POA: Diagnosis not present

## 2023-05-10 DIAGNOSIS — R296 Repeated falls: Secondary | ICD-10-CM | POA: Diagnosis not present

## 2023-05-10 DIAGNOSIS — R2681 Unsteadiness on feet: Secondary | ICD-10-CM | POA: Diagnosis not present

## 2023-05-10 LAB — CUP PACEART REMOTE DEVICE CHECK
Battery Remaining Longevity: 96 mo
Battery Voltage: 3 V
Brady Statistic AP VP Percent: 7.09 %
Brady Statistic AP VS Percent: 0.05 %
Brady Statistic AS VP Percent: 91.1 %
Brady Statistic AS VS Percent: 1.75 %
Brady Statistic RA Percent Paced: 7.12 %
Brady Statistic RV Percent Paced: 85.73 %
Date Time Interrogation Session: 20240827001803
HighPow Impedance: 68 Ohm
Implantable Lead Connection Status: 753985
Implantable Lead Connection Status: 753985
Implantable Lead Connection Status: 753985
Implantable Lead Implant Date: 20230222
Implantable Lead Implant Date: 20230222
Implantable Lead Implant Date: 20230222
Implantable Lead Location: 753858
Implantable Lead Location: 753859
Implantable Lead Location: 753860
Implantable Lead Model: 4798
Implantable Lead Model: 5076
Implantable Pulse Generator Implant Date: 20230222
Lead Channel Impedance Value: 218.5 Ohm
Lead Channel Impedance Value: 223.149
Lead Channel Impedance Value: 239.922
Lead Channel Impedance Value: 239.922
Lead Channel Impedance Value: 245.538
Lead Channel Impedance Value: 399 Ohm
Lead Channel Impedance Value: 437 Ohm
Lead Channel Impedance Value: 437 Ohm
Lead Channel Impedance Value: 456 Ohm
Lead Channel Impedance Value: 532 Ohm
Lead Channel Impedance Value: 570 Ohm
Lead Channel Impedance Value: 627 Ohm
Lead Channel Impedance Value: 646 Ohm
Lead Channel Impedance Value: 760 Ohm
Lead Channel Impedance Value: 779 Ohm
Lead Channel Impedance Value: 836 Ohm
Lead Channel Impedance Value: 836 Ohm
Lead Channel Impedance Value: 950 Ohm
Lead Channel Pacing Threshold Amplitude: 0.5 V
Lead Channel Pacing Threshold Amplitude: 0.625 V
Lead Channel Pacing Threshold Amplitude: 1.375 V
Lead Channel Pacing Threshold Pulse Width: 0.4 ms
Lead Channel Pacing Threshold Pulse Width: 0.4 ms
Lead Channel Pacing Threshold Pulse Width: 0.4 ms
Lead Channel Sensing Intrinsic Amplitude: 0.875 mV
Lead Channel Sensing Intrinsic Amplitude: 0.875 mV
Lead Channel Sensing Intrinsic Amplitude: 16 mV
Lead Channel Sensing Intrinsic Amplitude: 16 mV
Lead Channel Setting Pacing Amplitude: 1.5 V
Lead Channel Setting Pacing Amplitude: 2 V
Lead Channel Setting Pacing Amplitude: 2 V
Lead Channel Setting Pacing Pulse Width: 0.4 ms
Lead Channel Setting Pacing Pulse Width: 0.4 ms
Lead Channel Setting Sensing Sensitivity: 0.3 mV
Zone Setting Status: 755011
Zone Setting Status: 755011

## 2023-05-10 NOTE — Therapy (Signed)
OUTPATIENT PHYSICAL THERAPY LOWER EXTREMITY EVALUATION  PHYSICAL THERAPY DISCHARGE SUMMARY  Visits from Start of Care: 10  Current functional level related to goals / functional outcomes: Improved balance, strength, activity tolerance   Remaining deficits: Continues to demo clonic muscle activity which causes unsteadiness in stance, especially upon intitialy rising from sit.   Education / Equipment: HEP   Patient agrees to discharge. Patient goals were partially met. Patient is being discharged due to maximized rehab potential.    Patient Name: Rathana Hering MRN: [id number] DOB:06-29-1946, 77 y.o., male Today's Date: 05/10/2023  END OF SESSION:  PT End of Session - 05/10/23 1106     Visit Number 10    Date for PT Re-Evaluation 06/08/23    PT Start Time 1102    PT Stop Time 1140    PT Time Calculation (min) 38 min    Activity Tolerance Patient tolerated treatment well    Behavior During Therapy [facility] for tasks assessed/performed            Past Medical History:  Diagnosis Date   Acute blood loss anemia 10/05/2020   Acute respiratory failure (HCC), hypothermia therapy, vent - extubated 10/06/20    AKI (acute kidney injury) (HCC) 10/05/2020   Anoxic brain injury (HCC) 10/26/2020   Arrhythmia    Ascending aortic aneurysm (HCC) 01/29/2018   a. 2019 4.3 cm by echo; b. 02/2021 Echo: Ao root 40mm.   Benign brain tumor (HCC)    Benign neoplasm of brain (HCC) 12/19/2017   Cardiac arrest with ventricular fibrillation (HCC)    Chest pain 12/04/2017   Chronic kidney disease, stage III (moderate) (HCC) 12/19/2017   CKD (chronic kidney disease), stage III - IV (HCC)    Conductive hearing loss of both ears 08/24/2018   Coronary Artery Disease    a. 09/2020 Inf STEMI complicated by cardiac arrest/PCI: LAD 50p/m, LCX 100d (2.5 x 30 Resolute Onyx DES); b. 04/2021 PCI: 04/2021 LM nl, LAD 50p/74m (2.5x30 Onyx Frontier DES), LCX 25p, 33m (RFR 0.98), 40d ISR (RFR 0.98), OM2 25, RCA mild  diff dzs.   Diabetes mellitus, type 2 (HCC) 10/06/2020   10/06/20 A1C 7%   Dizziness 10/11/2019   Encounter for imaging study to confirm orogastric (OG) tube placement    Eustachian tube dysfunction 08/24/2018   Hematuria 10/26/2020   HFrEF (heart failure with reduced ejection fraction) (HCC)    a. 11/2020 Echo: EF 25-30%; b. 02/2021 Echo: EF 25-30%, glob HK. Mild LVH. Nl RV fxn. Triv AI. Ao root 40mm.   History of pulmonary embolus (PE)    HLD (hyperlipidemia) 10/26/2020   Hypertension    Iron deficiency anemia rec'd IV iron 10/26/2020   Ischemic cardiomyopathy    a. 11/2020 Echo: EF 25-30%; b. 02/2021 Echo: EF 25-30%, glob HK.   Left bundle branch block 12/19/2017   Meningioma (HCC) 08/14/2020   Nonsustained ventricular tachycardia (HCC) 10/11/2019   Pain of left hip joint 05/29/2020   Personal history of pulmonary embolism 12/19/2017   Pneumonia of both lungs due to infectious organism    Pulmonary hypertension due to thromboembolism (HCC) 03/09/2018   See echo 12/27/17 with nl PAS vs CTa chest 02/23/18    S/P angioplasty with stent 10/04/20 DES to LCX  10/26/2020   Sinus bradycardia 12/22/2017   SOB (shortness of breath)    Solitary pulmonary nodule on lung CT 03/08/2018   CT 12/04/17 1.0 x 0.8 x 0.7 cm nodular opacity in the RUL vs not seen 03/16/10 posterior segment of the  right upper lobe. Marland KitchenSpirometry 03/08/2018    FEV1 3.86 (108%)  Ratio 96 s prior rx  - PET  03/13/18   Low grade c/w adenoca > rec T surgery eval     STEMI (ST elevation myocardial infarction) (HCC) 10/04/2020   Urinary retention 10/26/2020   Vestibular schwannoma (HCC) 08/14/2020   Past Surgical History:  Procedure Laterality Date   APPENDECTOMY     BIV ICD INSERTION CRT-D N/A 11/03/2021   Procedure: BIV ICD INSERTION CRT-D;  Surgeon: Regan Lemming, MD;  Location: [facility] INVASIVE CV LAB;  Service: Cardiovascular;  Laterality: N/A;   CARDIAC CATHETERIZATION     CORONARY ANGIOPLASTY WITH STENT PLACEMENT Left 04/12/2021    LAD stent placement   CORONARY PRESSURE/FFR STUDY N/A 04/12/2021   Procedure: INTRAVASCULAR PRESSURE WIRE/FFR STUDY;  Surgeon: Yvonne Kendall, MD;  Location: [facility] INVASIVE CV LAB;  Service: Cardiovascular;  Laterality: N/A;   CORONARY STENT INTERVENTION N/A 04/12/2021   Procedure: CORONARY STENT INTERVENTION;  Surgeon: Yvonne Kendall, MD;  Location: [facility] INVASIVE CV LAB;  Service: Cardiovascular;  Laterality: N/A;   CORONARY ULTRASOUND/IVUS N/A 04/12/2021   Procedure: Intravascular Ultrasound/IVUS;  Surgeon: Yvonne Kendall, MD;  Location: [facility] INVASIVE CV LAB;  Service: Cardiovascular;  Laterality: N/A;   CORONARY/GRAFT ACUTE MI REVASCULARIZATION N/A 10/04/2020   Procedure: Coronary/Graft Acute MI Revascularization;  Surgeon: Yvonne Kendall, MD;  Location: [facility] INVASIVE CV LAB;  Service: Cardiovascular;  Laterality: N/A;   HERNIA REPAIR     LEFT HEART CATH AND CORONARY ANGIOGRAPHY N/A 10/04/2020   Procedure: LEFT HEART CATH AND CORONARY ANGIOGRAPHY;  Surgeon: Yvonne Kendall, MD;  Location: [facility] INVASIVE CV LAB;  Service: Cardiovascular;  Laterality: N/A;   RIGHT/LEFT HEART CATH AND CORONARY ANGIOGRAPHY N/A 04/12/2021   Procedure: RIGHT/LEFT HEART CATH AND CORONARY ANGIOGRAPHY;  Surgeon: Yvonne Kendall, MD;  Location: [facility] INVASIVE CV LAB;  Service: Cardiovascular;  Laterality: N/A;   Patient Active Problem List   Diagnosis Date Noted   Anemia    Leukocytosis    Right arm weakness 01/25/2022   Right sided weakness    Fall at home 01/22/2022   Status post biventricular pacemaker - MRI compatible PPM and leads 01/22/2022   Chronic indwelling Foley catheter 01/22/2022   CKD (chronic kidney disease), stage III - IV (HCC) 06/28/2021   Chronic HFrEF (heart failure with reduced ejection fraction) (HCC) 03/31/2021   Arrhythmia    DM (diabetes mellitus), type 2 (HCC) new 10/26/2020   Urinary retention 10/26/2020   Hyperlipidemia LDL goal <70 10/26/2020   Coronary artery disease of native artery of  native heart with stable angina pectoris (HCC) 10/26/2020   S/P angioplasty with stent 10/04/20 DES to LCX  10/26/2020   Ischemic cardiomyopathy 10/26/2020   Diabetes mellitus, type 2 (HCC) 10/06/2020   DOE (dyspnea on exertion)    Meningioma (HCC) 08/14/2020   Vestibular schwannoma (HCC) 08/14/2020   Cardiac arrest with ventricular fibrillation (HCC)    Pain of left hip joint 05/29/2020   Benign brain tumor (HCC)    Chronic kidney disease    History of pulmonary embolus (PE)    Hypertension    Nonsustained ventricular tachycardia (HCC) 10/11/2019   Dizziness 10/11/2019   Abnormal echocardiogram 06/07/2019   Eustachian tube dysfunction 08/24/2018   Conductive hearing loss of both ears 08/24/2018   Pulmonary hypertension due to thromboembolism (HCC) 03/09/2018   Solitary pulmonary nodule on lung CT 03/08/2018   Ascending aortic aneurysm (HCC) 4.2 cm based on CT from December 2020 01/29/2018   Sinus bradycardia  12/22/2017   Left bundle branch block 12/19/2017   Personal history of pulmonary embolism - no longer on anticoagulation due to pt refusal to take it anymore. 12/19/2017   Chronic kidney disease, stage III (moderate) (HCC) 12/19/2017   Benign neoplasm of brain (HCC) 12/19/2017    PCP: Gweneth Dimitri, MD  REFERRING PROVIDER: Abram Sander DIAG:  [phone] (ICD-10-CM) - Left hip pain  M25.551 (ICD-10-CM) - Pain in right hip  M54.2 (ICD-10-CM) - Cervicalgia    THERAPY DIAG:  Muscle weakness (generalized)  Right sided weakness  Unsteadiness on feet  Meningioma (HCC)  Repeated falls  Rationale for Evaluation and Treatment: Rehabilitation  ONSET DATE: 03/20/23  SUBJECTIVE:   SUBJECTIVE STATEMENT: Patient reports that he called his Dr to request the F/U with the neurologist, but has not heard back. He feels he still sits too much, but he is performing some stretching and strengthening. His diet is pretty good. He tries to walk each day.  PERTINENT HISTORY: Per  previous therapy notes: left parafalcine meningioma. Frequent falls, R RC injury  PAIN:  Are you having pain? Yes: NPRS scale: 0/10 Pain location: R knee pain-distal/lat/ant to knee. He also reports B hip pain and back Pain description: sharp Aggravating factors: Hurts the most at night Relieving factors: Salon Pase  PRECAUTIONS: Fall  RED FLAGS: None   WEIGHT BEARING RESTRICTIONS: No  FALLS:  Has patient fallen in last 6 months? No  LIVING ENVIRONMENT: Lives with: lives with their family Lives in: House/apartment Stairs: Yes: External: 7 steps; bilateral but cannot reach both Has following equipment at home: Single point cane, Walker - 2 wheeled, and shower chair  OCCUPATION: Retired  PLOF: Independent  PATIENT GOALS: Improve his ROM in his joints without pain.  NEXT MD VISIT: This afternoon  OBJECTIVE:   DIAGNOSTIC FINDINGS:  CT Lumbar- 3. Moderate degenerative spinal stenosis at L2-3. CT CERVICAL IMPRESSION: 1. No evidence of intracranial injury. 2. Negative for cervical spine fracture but there is prevertebral edema at C4-5 suggesting soft tissue injury. 3. Meningiomas with left cerebral of edema, reference brain MRI 01/22/2022.  COGNITION: Overall cognitive status: Within functional limits for tasks assessed     SENSATION: Patient reports numbness in entire R U and LE  EDEMA:  Patient denies  MUSCLE LENGTH: Hamstrings: B HS 70 Thomas test: B hip flexor tightness  POSTURE: rounded shoulders, forward head, decreased lumbar lordosis, and decreased thoracic kyphosis  PALPATION: TTP along B lumbar paraspinals and QL near iliac crests  CERVICAL ROM:  < 50% in all directions with muscle pull at end range  LUMBAR ROM: 50/70% in all directions with pain when sidebending to L and rotating to R.   LOWER EXTREMITY ROM: B hip ROM limited mildly in all planes, general stiffness in all joints, but WFL  LOWER EXTREMITY MMT: 4-(4-)/5, throughout, shaky with  decreased control and clonic movements  LOWER EXTREMITY SPECIAL TESTS:  Hip special tests: Luisa Hart (FABER) test: positive  and Hip scouring test: negative Knee special tests: Patellafemoral apprehension test: positive  R knee started to sublux with minimal lateral press, causing pain   FUNCTIONAL TESTS:  5 times sit to stand: TBD Timed up and go (TUG): TBD  GAIT: Distance walked: In Clinic Distances Assistive device utilized: None Level of assistance: Complete Independence Comments: Patient appeared much more stable when entering. Rose from chair and walked back with no sway or unsteadiness. However, later in the assessment he experienced unsteadiness when rising from chair.   TODAY'S TREATMENT:  DATE:  05/10/23 Bike L4 x 6 minutes Functional status re-assessed HEP reviewed  05/08/23 Bike L4 x 6 minutes, then 2 x 30 sec at L6 resistance. Sit to stand from slightly elevated mat with chest press of 4# ball at the top of stance. Squats with 10# weight to target 8" off floor. Very unstable on first attempt, fell back into sitting on mat. He was then able to complete 10 reps without additional LOB. Standing on Airex, OHP with 4# ball x 10. Initially very unsteady, required min A to balance, but once recovered, CGA. Repeat with trunk rotation to each side x 10, CGA, occasional instability. Seated knee flex, 35#, 3 x 10 reps Seated knee ext, 10#, 3 x 10 reps Heel raises on step, 2 x 10 reps, BUE support  05/03/23 Bike L 3.5 x6 min 6in step ups x10 each  Resisted side step 30lb x5 stopped on the last rep due to pt becoming unstable  HS curls 35lb 2x12 Leg Ext 10lb 2x15  05/01/23 Bike L 3 x6 min Resisted gait 30lb 4 way x 4 each HS curls 35lb 2x15 Leg Ext 10lb 2x15 Step ups from airex to 6in box, very difficult so removed airex. X10 each seated rest in between  sets  Shoulder Ext for posture 10lb 2x10 Heel raises 2x15  04/26/23 Bike L4 x 6 minutes, rest, then 2 x 30 sec at L6 focusing on circles. Attempted side stepping on airex plank, but ankles were too unstable and shaky Standing heel raises on step x 12 Standing toe raises x 12 Squat walking x 80' to increase challenge to control in BLE Seated HS curls, 35#, 2 x 15 Seated knee ext, 20#, 2 x 15 reps Still unable to balance on the airex beam Proprioception assessment for ankles was neg for any deficit. Upside down BOSU in parallel bars-very shaky and unstable, did not improve with time.  04/24/23 Bike L4 x 6 minutes Knee flexion, 35#, 2 x 15 reps Knee ext, 20#, 2 x 15 reps. Standing on Airex pad, weight shifts-lat and ant/post, mini squats. B side step onto and off the pad. All performed with back to the wall for safety, not touching. Ambulation x 80' with 6# weight in each hand, then again, with 3# weight on each ankle to increase proprioceptive feedback. No change with hand weights, but slightly less muscle quivering and clonic activity with weights on ankle. Leg press, 40#, BLE slow movement, 3 x 10 reps Heel raise on leg press, 40#, 2 x 10 reps.  04/12/23 NuStep L5 x 6 minutes Low back muscular assessment- No bruising or TTP, stiffness and soreness with mobility. Attempted to lie down, but patient reported too much pain in his chest.  04/10/23 Bike L3.5 x 7 min Resisted gait 30lb 4 way x 3 each HS curls 35lb 2x15 Leg Ext 10lb 2x15 S2S elevated mat 2x5, then LE's on airex 2x5 ALT 6in box taps x10, then fro airex x10  Rows and Ext green on airex 2x10 each  04/03/23 TUG 12 sec 5x STS 14.36 sec NuStep L5 x 6 mins DLS 10 sec DLS on airex 30 sec x3 Step ups onto airex in // bars CGA x10 each Step ups 4 in x10; 6 in x10; mod assist +2 Lateral step ups 6 in with R x5; mod assist +2; pt. had dizziness  03/30/23 Education   PATIENT EDUCATION:  Education details: POC Person  educated: Patient Education method: Explanation Education comprehension: verbalized understanding  HOME EXERCISE  PROGRAM: [id number]-from previous round of PT  ASSESSMENT:  CLINICAL IMPRESSION: His clonic muscle activity continues, particularly in ankles, causing instability in standing and gait. Functional status re-assessed and HEP reviewed. He is performing a large variety of exercises for strength, stretching, neck, and back pain. He also is trying to address his unsteadiness in standing, but has not been able to improve his ankle control at initial stance. He continues to demonstrate clonic ankle activity which causes instability and forces him to stand and wait before moving forward. He appears to have maxed out on his benefit from PT. Has initiated a neurologist consult to F/U with his clonic movements and numbness on entire R side.  OBJECTIVE IMPAIRMENTS: Abnormal gait, decreased activity tolerance, decreased balance, decreased coordination, decreased mobility, difficulty walking, decreased ROM, decreased strength, impaired flexibility, and pain.   ACTIVITY LIMITATIONS: carrying, lifting, bending, standing, squatting, stairs, transfers, and locomotion level  PARTICIPATION LIMITATIONS: meal prep, cleaning, laundry, shopping, and community activity  PERSONAL FACTORS: Past/current experiences and 1 comorbidity: Memingioma  are also affecting patient's functional outcome.   REHAB POTENTIAL: Good  CLINICAL DECISION MAKING: Evolving/moderate complexity  EVALUATION COMPLEXITY: Moderate   GOALS: Goals reviewed with patient? Yes  SHORT TERM GOALS: Target date: 04/12/23 I with initial HEP updates Baseline: Goal status: 04/26/23- Has previous program. met  LONG TERM GOALS: Target date: 06/08/23  I with final HEP Baseline:  Goal status: 05/10/23- met  2.  Complete TUG in < 15 sec Baseline:  Goal status: Met 04/03/23  3.  Complete 5 x STS in < 15 sec Baseline:  /Goal status: Met  04/03/23  4.  Pati/ent will return to walking program st home, able to walk x at least 20 minutes on level/unlevel surfaces MI. Baseline: Has not been walking due to pain and unsteadiness. Goal status: 05/10/23 Patient is walking outside x 10 minutes- not met  5.  Patient will report improved pain in any of his joints or back to <3/10 Baseline:  Goal status: 05/10/23 met  PLAN:  PT FREQUENCY: 1-2x/week  PT DURATION: 10 weeks  PLANNED INTERVENTIONS: Therapeutic exercises, Therapeutic activity, Neuromuscular re-education, Balance training, Gait training, Patient/Family education, Self Care, Joint mobilization, Stair training, Dry Needling, Electrical stimulation, Cryotherapy, Moist heat, Taping, Vasopneumatic device, Ionotophoresis 4mg /ml Dexamethasone, and Manual therapy  PLAN FOR NEXT SESSION:   Iona Beard, DPT,  05/10/2023, 11:41 AM

## 2023-05-16 ENCOUNTER — Ambulatory Visit (HOSPITAL_COMMUNITY)
Admission: RE | Admit: 2023-05-16 | Discharge: 2023-05-16 | Disposition: A | Discharge status: Home / Self Care | Payer: Medicare Other | Source: Ambulatory Visit | Attending: Family Medicine | Admitting: Family Medicine

## 2023-05-16 DIAGNOSIS — D321 Benign neoplasm of spinal meninges: Secondary | ICD-10-CM | POA: Diagnosis not present

## 2023-05-16 DIAGNOSIS — R202 Paresthesia of skin: Secondary | ICD-10-CM

## 2023-05-16 DIAGNOSIS — K8020 Calculus of gallbladder without cholecystitis without obstruction: Secondary | ICD-10-CM | POA: Diagnosis not present

## 2023-05-16 DIAGNOSIS — G9589 Other specified diseases of spinal cord: Secondary | ICD-10-CM | POA: Insufficient documentation

## 2023-05-16 DIAGNOSIS — M5126 Other intervertebral disc displacement, lumbar region: Secondary | ICD-10-CM | POA: Diagnosis not present

## 2023-05-16 DIAGNOSIS — D320 Benign neoplasm of cerebral meninges: Secondary | ICD-10-CM

## 2023-05-16 DIAGNOSIS — R29898 Other symptoms and signs involving the musculoskeletal system: Secondary | ICD-10-CM | POA: Diagnosis not present

## 2023-05-16 DIAGNOSIS — M4804 Spinal stenosis, thoracic region: Secondary | ICD-10-CM | POA: Diagnosis not present

## 2023-05-16 DIAGNOSIS — M48061 Spinal stenosis, lumbar region without neurogenic claudication: Secondary | ICD-10-CM | POA: Diagnosis not present

## 2023-05-17 ENCOUNTER — Ambulatory Visit: Payer: Medicare Other | Admitting: Physical Therapy

## 2023-05-19 ENCOUNTER — Ambulatory Visit: Payer: Medicare Other | Admitting: Physical Therapy

## 2023-05-23 NOTE — Progress Notes (Signed)
Remote ICD transmission.   

## 2023-05-24 ENCOUNTER — Ambulatory Visit: Payer: Medicare Other | Admitting: Physical Therapy

## 2023-05-26 ENCOUNTER — Ambulatory Visit: Payer: Medicare Other | Admitting: Physical Therapy

## 2023-05-29 ENCOUNTER — Ambulatory Visit: Payer: Medicare Other | Admitting: Physical Therapy

## 2023-05-31 ENCOUNTER — Ambulatory Visit: Payer: Medicare Other | Admitting: Physical Therapy

## 2023-06-05 ENCOUNTER — Ambulatory Visit: Payer: Medicare Other | Admitting: Physical Therapy

## 2023-06-07 ENCOUNTER — Ambulatory Visit: Payer: Medicare Other | Admitting: Physical Therapy

## 2023-06-10 ENCOUNTER — Other Ambulatory Visit: Payer: Self-pay | Admitting: Cardiology

## 2023-06-12 DIAGNOSIS — R222 Localized swelling, mass and lump, trunk: Secondary | ICD-10-CM | POA: Diagnosis not present

## 2023-06-12 DIAGNOSIS — D329 Benign neoplasm of meninges, unspecified: Secondary | ICD-10-CM | POA: Diagnosis not present

## 2023-06-26 DIAGNOSIS — Z23 Encounter for immunization: Secondary | ICD-10-CM | POA: Diagnosis not present

## 2023-07-10 DIAGNOSIS — D421 Neoplasm of uncertain behavior of spinal meninges: Secondary | ICD-10-CM | POA: Diagnosis not present

## 2023-07-10 DIAGNOSIS — I10 Essential (primary) hypertension: Secondary | ICD-10-CM | POA: Diagnosis not present

## 2023-07-10 DIAGNOSIS — I25119 Atherosclerotic heart disease of native coronary artery with unspecified angina pectoris: Secondary | ICD-10-CM | POA: Diagnosis not present

## 2023-07-10 DIAGNOSIS — I255 Ischemic cardiomyopathy: Secondary | ICD-10-CM | POA: Diagnosis not present

## 2023-07-10 DIAGNOSIS — Z23 Encounter for immunization: Secondary | ICD-10-CM | POA: Diagnosis not present

## 2023-07-10 DIAGNOSIS — N1832 Chronic kidney disease, stage 3b: Secondary | ICD-10-CM | POA: Diagnosis not present

## 2023-07-10 DIAGNOSIS — I5022 Chronic systolic (congestive) heart failure: Secondary | ICD-10-CM | POA: Diagnosis not present

## 2023-07-10 DIAGNOSIS — Z79899 Other long term (current) drug therapy: Secondary | ICD-10-CM | POA: Diagnosis not present

## 2023-07-10 DIAGNOSIS — E1122 Type 2 diabetes mellitus with diabetic chronic kidney disease: Secondary | ICD-10-CM | POA: Diagnosis not present

## 2023-07-10 DIAGNOSIS — D420 Neoplasm of uncertain behavior of cerebral meninges: Secondary | ICD-10-CM | POA: Diagnosis not present

## 2023-07-10 DIAGNOSIS — R296 Repeated falls: Secondary | ICD-10-CM | POA: Diagnosis not present

## 2023-07-10 DIAGNOSIS — I493 Ventricular premature depolarization: Secondary | ICD-10-CM | POA: Diagnosis not present

## 2023-07-11 ENCOUNTER — Telehealth: Payer: Self-pay | Admitting: Cardiology

## 2023-07-11 NOTE — Telephone Encounter (Signed)
Pt c/o BP issue: STAT if pt c/o blurred vision, one-sided weakness or slurred speech  1. What are your last 5 BP readings? 109/69   2. Are you having any other symptoms (ex. Dizziness, headache, blurred vision, passed out)? Dizziness   3. What is your BP issue? Pt states he has been having low blood pressure and some dizziness. Please advise

## 2023-07-13 NOTE — Telephone Encounter (Signed)
LVM for pt to call regarding message 

## 2023-07-14 DIAGNOSIS — N1832 Chronic kidney disease, stage 3b: Secondary | ICD-10-CM | POA: Diagnosis not present

## 2023-07-27 DIAGNOSIS — N1832 Chronic kidney disease, stage 3b: Secondary | ICD-10-CM | POA: Diagnosis not present

## 2023-07-27 DIAGNOSIS — D631 Anemia in chronic kidney disease: Secondary | ICD-10-CM | POA: Diagnosis not present

## 2023-07-27 DIAGNOSIS — N179 Acute kidney failure, unspecified: Secondary | ICD-10-CM | POA: Diagnosis not present

## 2023-07-27 DIAGNOSIS — I5022 Chronic systolic (congestive) heart failure: Secondary | ICD-10-CM | POA: Diagnosis not present

## 2023-07-27 DIAGNOSIS — I129 Hypertensive chronic kidney disease with stage 1 through stage 4 chronic kidney disease, or unspecified chronic kidney disease: Secondary | ICD-10-CM | POA: Diagnosis not present

## 2023-07-27 DIAGNOSIS — E872 Acidosis, unspecified: Secondary | ICD-10-CM | POA: Diagnosis not present

## 2023-07-27 DIAGNOSIS — N400 Enlarged prostate without lower urinary tract symptoms: Secondary | ICD-10-CM | POA: Diagnosis not present

## 2023-08-08 ENCOUNTER — Ambulatory Visit (INDEPENDENT_AMBULATORY_CARE_PROVIDER_SITE_OTHER): Payer: Medicare Other

## 2023-08-08 DIAGNOSIS — I255 Ischemic cardiomyopathy: Secondary | ICD-10-CM

## 2023-08-08 DIAGNOSIS — I5022 Chronic systolic (congestive) heart failure: Secondary | ICD-10-CM

## 2023-08-08 LAB — CUP PACEART REMOTE DEVICE CHECK
Battery Remaining Longevity: 92 mo
Battery Voltage: 3 V
Brady Statistic AP VP Percent: 8.77 %
Brady Statistic AP VS Percent: 0.05 %
Brady Statistic AS VP Percent: 89.17 %
Brady Statistic AS VS Percent: 2.02 %
Brady Statistic RA Percent Paced: 8.77 %
Brady Statistic RV Percent Paced: 86.93 %
Date Time Interrogation Session: 20241126052606
HighPow Impedance: 68 Ohm
Implantable Lead Connection Status: 753985
Implantable Lead Connection Status: 753985
Implantable Lead Connection Status: 753985
Implantable Lead Implant Date: 20230222
Implantable Lead Implant Date: 20230222
Implantable Lead Implant Date: 20230222
Implantable Lead Location: 753858
Implantable Lead Location: 753859
Implantable Lead Location: 753860
Implantable Lead Model: 4798
Implantable Lead Model: 5076
Implantable Pulse Generator Implant Date: 20230222
Lead Channel Impedance Value: 220.723
Lead Channel Impedance Value: 220.723
Lead Channel Impedance Value: 237.865
Lead Channel Impedance Value: 247 Ohm
Lead Channel Impedance Value: 268.667
Lead Channel Impedance Value: 399 Ohm
Lead Channel Impedance Value: 399 Ohm
Lead Channel Impedance Value: 494 Ohm
Lead Channel Impedance Value: 494 Ohm
Lead Channel Impedance Value: 532 Ohm
Lead Channel Impedance Value: 589 Ohm
Lead Channel Impedance Value: 589 Ohm
Lead Channel Impedance Value: 703 Ohm
Lead Channel Impedance Value: 779 Ohm
Lead Channel Impedance Value: 817 Ohm
Lead Channel Impedance Value: 855 Ohm
Lead Channel Impedance Value: 912 Ohm
Lead Channel Impedance Value: 912 Ohm
Lead Channel Pacing Threshold Amplitude: 0.5 V
Lead Channel Pacing Threshold Amplitude: 0.75 V
Lead Channel Pacing Threshold Amplitude: 1.625 V
Lead Channel Pacing Threshold Pulse Width: 0.4 ms
Lead Channel Pacing Threshold Pulse Width: 0.4 ms
Lead Channel Pacing Threshold Pulse Width: 0.4 ms
Lead Channel Sensing Intrinsic Amplitude: 1 mV
Lead Channel Sensing Intrinsic Amplitude: 1 mV
Lead Channel Sensing Intrinsic Amplitude: 15.875 mV
Lead Channel Sensing Intrinsic Amplitude: 15.875 mV
Lead Channel Setting Pacing Amplitude: 1.5 V
Lead Channel Setting Pacing Amplitude: 2 V
Lead Channel Setting Pacing Amplitude: 2.25 V
Lead Channel Setting Pacing Pulse Width: 0.4 ms
Lead Channel Setting Pacing Pulse Width: 0.4 ms
Lead Channel Setting Sensing Sensitivity: 0.3 mV
Zone Setting Status: 755011
Zone Setting Status: 755011

## 2023-08-09 DIAGNOSIS — M4802 Spinal stenosis, cervical region: Secondary | ICD-10-CM | POA: Diagnosis not present

## 2023-08-09 DIAGNOSIS — M47812 Spondylosis without myelopathy or radiculopathy, cervical region: Secondary | ICD-10-CM | POA: Diagnosis not present

## 2023-08-09 DIAGNOSIS — D329 Benign neoplasm of meninges, unspecified: Secondary | ICD-10-CM | POA: Diagnosis not present

## 2023-08-09 DIAGNOSIS — R200 Anesthesia of skin: Secondary | ICD-10-CM | POA: Diagnosis not present

## 2023-08-21 ENCOUNTER — Other Ambulatory Visit: Payer: Self-pay | Admitting: Thoracic Surgery (Cardiothoracic Vascular Surgery)

## 2023-08-21 DIAGNOSIS — I7121 Aneurysm of the ascending aorta, without rupture: Secondary | ICD-10-CM

## 2023-09-01 ENCOUNTER — Ambulatory Visit: Payer: Medicare Other | Admitting: Cardiology

## 2023-09-08 ENCOUNTER — Other Ambulatory Visit: Payer: Self-pay | Admitting: Cardiology

## 2023-09-11 NOTE — Progress Notes (Signed)
Remote ICD transmission.   

## 2023-09-22 ENCOUNTER — Ambulatory Visit: Payer: Medicare Other | Attending: Cardiology | Admitting: Cardiology

## 2023-09-22 VITALS — BP 126/60 | HR 40 | Ht 73.5 in | Wt 218.0 lb

## 2023-09-22 DIAGNOSIS — I469 Cardiac arrest, cause unspecified: Secondary | ICD-10-CM | POA: Diagnosis not present

## 2023-09-22 DIAGNOSIS — I4901 Ventricular fibrillation: Secondary | ICD-10-CM | POA: Diagnosis not present

## 2023-09-22 DIAGNOSIS — R42 Dizziness and giddiness: Secondary | ICD-10-CM | POA: Insufficient documentation

## 2023-09-22 DIAGNOSIS — I447 Left bundle-branch block, unspecified: Secondary | ICD-10-CM | POA: Diagnosis not present

## 2023-09-22 DIAGNOSIS — I25118 Atherosclerotic heart disease of native coronary artery with other forms of angina pectoris: Secondary | ICD-10-CM | POA: Diagnosis not present

## 2023-09-22 NOTE — Progress Notes (Addendum)
 Cardiology Office Note:    Date:  09/22/2023   ID:  Timothy Moran, DOB 25-Jul-1946, MRN [id number]  PCP:  Timothy Harvey, MD  Cardiologist:  Timothy Fitch, MD    Referring MD: Timothy Harvey, MD   Chief Complaint  Patient presents with   Dizziness   Extremity Weakness    History of Present Illness:    Timothy Moran is a 78 y.o. male   with incredibly complex past medical history.  Initially he was seen in my office because of history of pulmonary emboli he did have mild pulmonary hypertension but overall was doing quite well.  However at the end of January he was shoveling the snow started having chest pain 1 back and collapse he was found to be in V. fib he was defibrillated brought to [facility] he was found to have acute STEMI involving circumflex artery that was addressed with PTCA and drug-eluting stent.  He does have residual 80% stenosis of the mid LAD he ended up having diminished ejection fraction with level of 25%.  He did wear LifeVest however absolutely hated this and does not want to wear it anymore.  He comes today to my office complaining of having some fatigue tiredness as well as dizziness upon getting up. In August he did have PTCA and stenting of the LAD lesion he tolerated quite well and since that time he is doing much better.  He said he got much more energy.   Biggest complaint that he have his dizziness.  Quite extensive evaluation has been done look like this not cardiac related he recently had cardiac surgery done he also required special glasses after that week which he did not get is convinced that dizziness comes from the fact that he got 1 glasses in the process of trying to find out what the problem with his dizziness is we were forced to stop a lot of medications. Comes today to months for follow-up overall looks like he is doing quite well.  Denies have any chest pain tightness squeezing pressure burning chest no dizziness no passing out.  He  complained of having weakness and fatigue he admits that he did not does not go to physical therapy anymore he spends time playing with iPad.  Past Medical History:  Diagnosis Date   Acute blood loss anemia 10/05/2020   Acute respiratory failure (HCC), hypothermia therapy, vent - extubated 10/06/20    AKI (acute kidney injury) (HCC) 10/05/2020   Anoxic brain injury (HCC) 10/26/2020   Arrhythmia    Ascending aortic aneurysm (HCC) 01/29/2018   a. 2019 4.3 cm by echo; b. 02/2021 Echo: Ao root 40mm.   Benign brain tumor (HCC)    Benign neoplasm of brain (HCC) 12/19/2017   Cardiac arrest with ventricular fibrillation (HCC)    Chest pain 12/04/2017   Chronic kidney disease, stage III (moderate) (HCC) 12/19/2017   CKD (chronic kidney disease), stage III - IV (HCC)    Conductive hearing loss of both ears 08/24/2018   Coronary Artery Disease    a. 09/2020 Inf STEMI complicated by cardiac arrest/PCI: LAD 50p/m, LCX 100d (2.5 x 30 Resolute Onyx DES); b. 04/2021 PCI: 04/2021 LM nl, LAD 50p/86m (2.5x30 Onyx Frontier DES), LCX 25p, 35m (RFR 0.98), 40d ISR (RFR 0.98), OM2 25, RCA mild diff dzs.   Diabetes mellitus, type 2 (HCC) 10/06/2020   10/06/20 A1C 7%   Dizziness 10/11/2019   Encounter for imaging study to confirm orogastric (OG) tube placement    Eustachian tube  dysfunction 08/24/2018   Hematuria 10/26/2020   HFrEF (heart failure with reduced ejection fraction) (HCC)    a. 11/2020 Echo: EF 25-30%; b. 02/2021 Echo: EF 25-30%, glob HK. Mild LVH. Nl RV fxn. Triv AI. Ao root 40mm.   History of pulmonary embolus (PE)    HLD (hyperlipidemia) 10/26/2020   Hypertension    Iron deficiency anemia rec'd IV iron 10/26/2020   Ischemic cardiomyopathy    a. 11/2020 Echo: EF 25-30%; b. 02/2021 Echo: EF 25-30%, glob HK.   Left bundle branch block 12/19/2017   Meningioma (HCC) 08/14/2020   Nonsustained ventricular tachycardia (HCC) 10/11/2019   Pain of left hip joint 05/29/2020   Personal history of pulmonary  embolism 12/19/2017   Pneumonia of both lungs due to infectious organism    Pulmonary hypertension due to thromboembolism (HCC) 03/09/2018   See echo 12/27/17 with nl PAS vs CTa chest 02/23/18    S/P angioplasty with stent 10/04/20 DES to LCX  10/26/2020   Sinus bradycardia 12/22/2017   SOB (shortness of breath)    Solitary pulmonary nodule on lung CT 03/08/2018   CT 12/04/17 1.0 x 0.8 x 0.7 cm nodular opacity in the RUL vs not seen 03/16/10 posterior segment of the right upper lobe. SABRASpirometry 03/08/2018    FEV1 3.86 (108%)  Ratio 96 s prior rx  - PET  03/13/18   Low grade c/w adenoca > rec T surgery eval     STEMI (ST elevation myocardial infarction) (HCC) 10/04/2020   Urinary retention 10/26/2020   Vestibular schwannoma (HCC) 08/14/2020    Past Surgical History:  Procedure Laterality Date   APPENDECTOMY     BIV ICD INSERTION CRT-D N/A 11/03/2021   Procedure: BIV ICD INSERTION CRT-D;  Surgeon: Timothy Soyla Lunger, MD;  Location: [facility] INVASIVE CV LAB;  Service: Cardiovascular;  Laterality: N/A;   CARDIAC CATHETERIZATION     CORONARY ANGIOPLASTY WITH STENT PLACEMENT Left 04/12/2021   LAD stent placement   CORONARY PRESSURE/FFR STUDY N/A 04/12/2021   Procedure: INTRAVASCULAR PRESSURE WIRE/FFR STUDY;  Surgeon: Timothy Bruckner, MD;  Location: [facility] INVASIVE CV LAB;  Service: Cardiovascular;  Laterality: N/A;   CORONARY STENT INTERVENTION N/A 04/12/2021   Procedure: CORONARY STENT INTERVENTION;  Surgeon: Timothy Bruckner, MD;  Location: [facility] INVASIVE CV LAB;  Service: Cardiovascular;  Laterality: N/A;   CORONARY ULTRASOUND/IVUS N/A 04/12/2021   Procedure: Intravascular Ultrasound/IVUS;  Surgeon: Timothy Bruckner, MD;  Location: [facility] INVASIVE CV LAB;  Service: Cardiovascular;  Laterality: N/A;   CORONARY/GRAFT ACUTE MI REVASCULARIZATION N/A 10/04/2020   Procedure: Coronary/Graft Acute MI Revascularization;  Surgeon: Timothy Bruckner, MD;  Location: [facility] INVASIVE CV LAB;  Service: Cardiovascular;  Laterality: N/A;    HERNIA REPAIR     LEFT HEART CATH AND CORONARY ANGIOGRAPHY N/A 10/04/2020   Procedure: LEFT HEART CATH AND CORONARY ANGIOGRAPHY;  Surgeon: Timothy Bruckner, MD;  Location: [facility] INVASIVE CV LAB;  Service: Cardiovascular;  Laterality: N/A;   RIGHT/LEFT HEART CATH AND CORONARY ANGIOGRAPHY N/A 04/12/2021   Procedure: RIGHT/LEFT HEART CATH AND CORONARY ANGIOGRAPHY;  Surgeon: Timothy Bruckner, MD;  Location: [facility] INVASIVE CV LAB;  Service: Cardiovascular;  Laterality: N/A;    Current Medications: Current Meds  Medication Sig   acetaminophen  (TYLENOL ) 325 MG tablet Take 1-2 tablets (325-650 mg total) by mouth every 4 (four) hours as needed for mild pain.   amiodarone  (PACERONE ) 200 MG tablet Take 0.5 tablets (100 mg total) by mouth daily.   aspirin  EC 81 MG tablet Take 81 mg by mouth every other day. Swallow  whole.   atorvastatin  (LIPITOR ) 80 MG tablet TAKE 1 TABLET BY MOUTH EVERY DAY   B Complex-C (SUPER B COMPLEX PO) Take 1 tablet by mouth daily. Unknown strength   Cholecalciferol 125 MCG (5000 UT) TABS Take 1 tablet by mouth daily.   diclofenac  Sodium (VOLTAREN ) 1 % GEL Apply 4 g topically 4 (four) times daily.   fluticasone  (FLONASE ) 50 MCG/ACT nasal spray Place 1-2 sprays into both nostrils at bedtime.   hydrALAZINE  (APRESOLINE ) 10 MG tablet Take 10 mg by mouth 3 (three) times daily.   isosorbide  dinitrate (ISORDIL ) 30 MG tablet TAKE 1 TABLET BY MOUTH 2 TIMES DAILY.   melatonin 5 MG TABS Take 1 tablet (5 mg total) by mouth at bedtime.   Melatonin-Pyridoxine 5-1 MG TABS Take 5 mg by mouth daily.   metoprolol  succinate (TOPROL -XL) 50 MG 24 hr tablet Take 1 tablet by mouth daily.   Multiple Vitamin (MULTIVITAMIN WITH MINERALS) TABS tablet Take 1 tablet by mouth daily.   rosuvastatin  (CRESTOR ) 10 MG tablet Take 1 tablet by mouth daily.   senna-docusate (SENOKOT-S) 8.6-50 MG tablet Take 1 tablet by mouth at bedtime as needed for mild constipation.   sodium bicarbonate  650 MG tablet Take 650 mg by mouth  2 (two) times daily.   valsartan  (DIOVAN ) 160 MG tablet Take 1 tablet by mouth daily.     Allergies:   Hydralazine  and Mexiletine   Social History   Socioeconomic History   Marital status: Married    Spouse name: Renny Remer    Number of children: 1   Years of education: 16   Highest education level: Bachelor's degree (e.g., BA, AB, BS)  Occupational History   Not on file  Tobacco Use   Smoking status: Former    Current packs/day: 0.00    Average packs/day: 0.5 packs/day for 5.0 years (2.5 ttl pk-yrs)    Types: Cigarettes    Start date: 09/13/1963    Quit date: 09/12/1968    Years since quitting: 55.0   Smokeless tobacco: Never  Vaping Use   Vaping status: Never Used  Substance and Sexual Activity   Alcohol use: Yes    Comment: ONE PER WEEK   Drug use: Never   Sexual activity: Not Currently  Other Topics Concern   Not on file  Social History Narrative   Not on file   Social Drivers of Health   Financial Resource Strain: Not on file  Food Insecurity: Not on file  Transportation Needs: Not on file  Physical Activity: Not on file  Stress: Not on file  Social Connections: Not on file     Family History: The patient's family history includes Brain cancer in his sister; Diabetes in his mother; Leukemia in his brother; Melanoma in his brother. ROS:   Please see the history of present illness.    All 14 point review of systems negative except as described per history of present illness  EKGs/Labs/Other Studies Reviewed:    EKG Interpretation Date/Time:  Friday September 22 2023 10:22:44 EST Ventricular Rate:  77 PR Interval:  192 QRS Duration:  178 QT Interval:  468 QTC Calculation: 529 R Axis:   192  Text Interpretation: Atrial-sensed ventricular-paced rhythm with occasional Premature ventricular complexes When compared with ECG of 12-Apr-2023 12:39, PREVIOUS ECG IS PRESENT Confirmed by Bernie Charleston [phone]) on 09/22/2023 10:50:33 AM    Recent  Labs: 10/03/2022: ALT 20 10/17/2022: TSH 5.400 04/12/2023: BUN 34; Creatinine, Ser 2.41; Hemoglobin 13.1; Platelets 265; Potassium 4.8; Sodium 138  Recent Lipid Panel    Component Value Date/Time   CHOL 118 10/03/2022 1400   TRIG 218 (H) 10/03/2022 1400   HDL 31 (L) 10/03/2022 1400   CHOLHDL 3.8 10/03/2022 1400   CHOLHDL 3.2 04/13/2021 0317   VLDL 40 04/13/2021 0317   LDLCALC 52 10/03/2022 1400    Physical Exam:    VS:  BP 126/60 (BP Location: Right Arm, Patient Position: Sitting)   Pulse (!) 40   Ht 6' 1.5 (1.867 m)   Wt 218 lb (98.9 kg)   SpO2 96%   BMI 28.37 kg/m     Wt Readings from Last 3 Encounters:  09/22/23 218 lb (98.9 kg)  04/12/23 220 lb (99.8 kg)  02/22/23 219 lb (99.3 kg)     GEN:  Well nourished, well developed in no acute distress HEENT: Normal NECK: No JVD; No carotid bruits LYMPHATICS: No lymphadenopathy CARDIAC: RRR, no murmurs, no rubs, no gallops RESPIRATORY:  Clear to auscultation without rales, wheezing or rhonchi  ABDOMEN: Soft, non-tender, non-distended MUSCULOSKELETAL:  No edema; No deformity  SKIN: Warm and dry LOWER EXTREMITIES: no swelling NEUROLOGIC:  Alert and oriented x 3 PSYCHIATRIC:  Normal affect   ASSESSMENT:    1. Dizziness   2. Cardiac arrest with ventricular fibrillation (HCC)   3. Coronary artery disease of native artery of native heart with stable angina pectoris (HCC)   4. Left bundle branch block    PLAN:    In order of problems listed above:  Cardiomyopathy stable on medication he is able to tolerate.  The problem is orthostatic hypotension.  Therefore the only small dosages of medication which will continue. S/p cardiac arrest.  Defibrillator in place no discharges no detections, OptiVol is nice and stable. Coronary disease stable on appropriate medication. Dyslipidemia I did review K PN LDL 52 HDL of 31 continue present management.   Medication Adjustments/Labs and Tests Ordered: Current medicines are reviewed at  length with the patient today.  Concerns regarding medicines are outlined above.  Orders Placed This Encounter  Procedures   EKG 12-Lead   Medication changes: No orders of the defined types were placed in this encounter.   Signed, Timothy DOROTHA Fitch, MD, [facility] 09/22/2023 11:05 AM    [facility]

## 2023-09-22 NOTE — Patient Instructions (Signed)
 Medication Instructions:  Your physician recommends that you continue on your current medications as directed. Please refer to the Current Medication list given to you today.  *If you need a refill on your cardiac medications before your next appointment, please call your pharmacy*   Lab Work: None Ordered If you have labs (blood work) drawn today and your tests are completely normal, you will receive your results only by: MyChart Message (if you have MyChart) OR A paper copy in the mail If you have any lab test that is abnormal or we need to change your treatment, we will call you to review the results.   Testing/Procedures: None Ordered   Follow-Up: At [facility], you and your health needs are our priority.  As part of our continuing mission to provide you with exceptional heart care, we have created designated Provider Care Teams.  These Care Teams include your primary Cardiologist (physician) and Advanced Practice Providers (APPs -  Physician Assistants and Nurse Practitioners) who all work together to provide you with the care you need, when you need it.  We recommend signing up for the patient portal called "MyChart".  Sign up information is provided on this After Visit Summary.  MyChart is used to connect with patients for Virtual Visits (Telemedicine).  Patients are able to view lab/test results, encounter notes, upcoming appointments, etc.  Non-urgent messages can be sent to your provider as well.   To learn more about what you can do with MyChart, go to [email or web address].    Your next appointment:   6 month(s)  The format for your next appointment:   In Person  Provider:   Lamar Fitch, MD    Other Instructions NA

## 2023-09-28 NOTE — Progress Notes (Signed)
[address]       [place] [address]             [phone]        Timothy Moran [id number] 1945/11/02  History of Present Illness: Timothy Moran is a 78 year old male with a past medical history of past history of CAD (S/P DES), DM, hypertension, hyperlipidemia, pulmonary embolus, stage III chronic kidney disease, HFrEF, right upper lobe nodules, Vfib cardiac arrest with anoxic brain injury, and ascending aortic aneurysm. He was noted to have a thoracic aortic aneurysm in 2019 measuring 4.1cm during follow up for pulmonary nodules. Both the aneurysm and pulmonary nodules have remained relatively stable since then.   Today he reports overall unsteadiness since his fall about 2.5 years ago. He reports after the fall his entire right side has felt weak and numb and he continues to see physical therapy for this. He does his exercises about once per week but knows he should be exercising more often. He denies chest pain, shortness of breath, dizziness, and LOC.   Current Outpatient Medications on File Prior to Visit  Medication Sig Dispense Refill   acetaminophen (TYLENOL) 325 MG tablet Take 1-2 tablets (325-650 mg total) by mouth every 4 (four) hours as needed for mild pain.     amiodarone (PACERONE) 200 MG tablet Take 0.5 tablets (100 mg total) by mouth daily. 45 tablet 2   aspirin EC 81 MG tablet Take 81 mg by mouth every other day. Swallow whole.     atorvastatin (LIPITOR) 80 MG tablet TAKE 1 TABLET BY MOUTH EVERY DAY 90 tablet 2   B Complex-C (SUPER B COMPLEX PO) Take 1 tablet by mouth daily. Unknown strength     Cholecalciferol 125 MCG (5000 UT) TABS Take 1 tablet by mouth daily.     diclofenac Sodium (VOLTAREN) 1 % GEL Apply 4 g topically 4 (four) times daily. 100 g 0   fluticasone (FLONASE) 50 MCG/ACT nasal spray Place 1-2 sprays into both nostrils at bedtime.  2   hydrALAZINE (APRESOLINE) 10 MG tablet Take 10 mg by mouth 3 (three) times daily.      isosorbide dinitrate (ISORDIL) 30 MG tablet TAKE 1 TABLET BY MOUTH 2 TIMES DAILY. 180 tablet 2   melatonin 5 MG TABS Take 1 tablet (5 mg total) by mouth at bedtime. 30 tablet 0   Melatonin-Pyridoxine 5-1 MG TABS Take 5 mg by mouth daily.     metoprolol succinate (TOPROL-XL) 50 MG 24 hr tablet Take 1 tablet by mouth daily.     Multiple Vitamin (MULTIVITAMIN WITH MINERALS) TABS tablet Take 1 tablet by mouth daily.     rosuvastatin (CRESTOR) 10 MG tablet Take 1 tablet by mouth daily.     senna-docusate (SENOKOT-S) 8.6-50 MG tablet Take 1 tablet by mouth at bedtime as needed for mild constipation.     sodium bicarbonate 650 MG tablet Take 650 mg by mouth 2 (two) times daily.     valsartan (DIOVAN) 160 MG tablet Take 1 tablet by mouth daily.     No current facility-administered medications on file prior to visit.   Vitals: Today's Vitals   10/12/23 1323  BP: 111/69  Pulse: 65  Resp: 20  SpO2: 93%   There is no height or weight on file to calculate BMI.  Physical Exam General: Alert and oriented, no acute distress Neuro: Grossly intact, no focal weakness, generalized unsteadiness when standing  CV: Regular rate and rhythm, no  murmur Pulm: Clear to auscultation bilaterally GI: Nontender Extremities: No edema, 2+ radial pulses bilaterally  CTA Results: CLINICAL DATA:  Follow-up ascending aortic aneurysm.   EXAM: CT CHEST WITHOUT CONTRAST   TECHNIQUE: Multidetector CT imaging of the chest was performed following the standard protocol without IV contrast.   RADIATION DOSE REDUCTION: This exam was performed according to the departmental dose-optimization program which includes automated exposure control, adjustment of the mA and/or kV according to patient size and/or use of iterative reconstruction technique.   COMPARISON:  Chest CT 09/28/2022 and 01/22/2022. Thoracic MRI 05/16/2023.   FINDINGS: Cardiovascular: Left subclavian pacemaker leads extend into the right atrium, right  ventricle and coronary sinus. Extensive coronary artery atherosclerosis again noted. There is mild atherosclerosis of the aorta and great vessels. Stable mild dilatation of the ascending aorta which has a maximal diameter of 4.3 cm. The aortic arch and descending aorta are normal in caliber. Stable mild cardiomegaly without significant pericardial fluid.   Mediastinum/Nodes: There are no enlarged mediastinal, hilar or axillary lymph nodes.Unchanged small mediastinal lymph nodes. The thyroid gland, trachea and esophagus demonstrate no significant findings.   Lungs/Pleura: Chronic loculated right pleural effusion with associated pleural thickening and adjacent rounded atelectasis in the right lower lobe. Unchanged partially calcified right upper lobe nodule measuring 9 x 5 mm on image 37/5, consistent with a granuloma. No new or enlarging pulmonary nodules. Stable mild central airway thickening.   Upper abdomen: The visualized upper abdomen appears stable, without significant findings.   Musculoskeletal/Chest wall: There is no chest wall mass or suspicious osseous finding. Stable multilevel spondylosis. Stable sizable calcified meningioma at T8 which has been previously evaluated by MRI.   IMPRESSION: 1. Stable mild dilatation of the ascending aorta which has a maximal diameter of 4.3 cm. Consider continued imaging surveillance as clinically warranted. 2. Chronic loculated right pleural effusion with associated pleural thickening and adjacent rounded atelectasis in the right lower lobe. 3. Stable partially calcified right upper lobe nodule, consistent with a granuloma. No new or enlarging pulmonary nodules. 4. Stable calcified meningioma at T8 which has been previously evaluated by MRI. 5.  Aortic Atherosclerosis (ICD10-I70.0).     Electronically Signed   By: Carey Bullocks M.D.   On: 10/10/2023 12:05  Impression and Plan: TAA: Timothy Moran presents to the clinic with a  stable 4.3cm ascending aortic aneurysm which does not yet meet surgical threshold of 5.5cm.  Echocardiogram on 03/21/23 shows a tricuspid aortic valve with evidence of mild regurgitation. We discussed the natural history and and risk factors for growth of ascending aortic aneurysms.  We covered the importance of tight blood pressure control, refraining from lifting heavy objects, and avoiding fluoroquinolones. The patient is aware of signs and symptoms of aortic dissection and when to present to the emergency department. We will continue surveillance in 1 year with noncontrast CT chest. I gave the patient the option for a virtual visit if he chooses to do so.   R pleural effusion/RUL nodule: Stable chronic loculated right pleural effusion, stable partially calcified RUL nodule consistent with granuloma. Will continue to monitor on annual scans.  Meningioma: Stable chronic calcified T8 meningioma previously evaluated by MRI and follows up with [facility] neurosurgery for this and left lateral ventricle meningiomas.   Risk Modification:  Statin:  Rosuvastatin  Smoking cessation instruction/counseling given: former smoker in his 58s  Patient was counseled on importance of Blood Pressure Control.  Despite Medical intervention if the patient notices persistently elevated blood pressure readings.  They are instructed to contact their Primary Care Physician  Please avoid use of Fluoroquinolones as this can potentially increase your risk of Aortic Rupture and/or Dissection  Patient educated on signs and symptoms of Aortic Dissection, handout also provided in AVS  Jenny Reichmann, PA-C 09/28/23

## 2023-10-05 ENCOUNTER — Other Ambulatory Visit: Payer: Medicare Other

## 2023-10-10 ENCOUNTER — Ambulatory Visit
Admission: RE | Admit: 2023-10-10 | Discharge: 2023-10-10 | Discharge status: Home / Self Care | Payer: Medicare Other | Source: Ambulatory Visit | Attending: Thoracic Surgery (Cardiothoracic Vascular Surgery) | Admitting: Thoracic Surgery (Cardiothoracic Vascular Surgery)

## 2023-10-10 DIAGNOSIS — I7121 Aneurysm of the ascending aorta, without rupture: Secondary | ICD-10-CM | POA: Diagnosis not present

## 2023-10-10 DIAGNOSIS — J90 Pleural effusion, not elsewhere classified: Secondary | ICD-10-CM | POA: Diagnosis not present

## 2023-10-10 DIAGNOSIS — I700 Atherosclerosis of aorta: Secondary | ICD-10-CM | POA: Diagnosis not present

## 2023-10-10 DIAGNOSIS — J9811 Atelectasis: Secondary | ICD-10-CM | POA: Diagnosis not present

## 2023-10-11 DIAGNOSIS — M25561 Pain in right knee: Secondary | ICD-10-CM | POA: Diagnosis not present

## 2023-10-11 DIAGNOSIS — I493 Ventricular premature depolarization: Secondary | ICD-10-CM | POA: Diagnosis not present

## 2023-10-11 DIAGNOSIS — R269 Unspecified abnormalities of gait and mobility: Secondary | ICD-10-CM | POA: Diagnosis not present

## 2023-10-11 DIAGNOSIS — I25119 Atherosclerotic heart disease of native coronary artery with unspecified angina pectoris: Secondary | ICD-10-CM | POA: Diagnosis not present

## 2023-10-11 DIAGNOSIS — I10 Essential (primary) hypertension: Secondary | ICD-10-CM | POA: Diagnosis not present

## 2023-10-11 DIAGNOSIS — I255 Ischemic cardiomyopathy: Secondary | ICD-10-CM | POA: Diagnosis not present

## 2023-10-11 DIAGNOSIS — N1832 Chronic kidney disease, stage 3b: Secondary | ICD-10-CM | POA: Diagnosis not present

## 2023-10-11 DIAGNOSIS — Z6829 Body mass index (BMI) 29.0-29.9, adult: Secondary | ICD-10-CM | POA: Diagnosis not present

## 2023-10-11 DIAGNOSIS — R29898 Other symptoms and signs involving the musculoskeletal system: Secondary | ICD-10-CM | POA: Diagnosis not present

## 2023-10-12 ENCOUNTER — Ambulatory Visit (INDEPENDENT_AMBULATORY_CARE_PROVIDER_SITE_OTHER): Payer: Medicare Other | Admitting: Physician Assistant

## 2023-10-12 VITALS — BP 111/69 | HR 65 | Resp 20

## 2023-10-12 DIAGNOSIS — I7121 Aneurysm of the ascending aorta, without rupture: Secondary | ICD-10-CM

## 2023-10-12 NOTE — Patient Instructions (Signed)
Risk Modification in those with ascending thoracic aortic aneurysm:  Continue good control of blood pressure (prefer SBP 130/80 or less)  2. Avoid fluoroquinolone antibiotics (I.e Ciprofloxacin, Avelox, Levofloxacin, Ofloxacin)  3.  Use of statin (to decrease cardiovascular risk)  4.  Exercise and activity limitations is individualized, but in general, contact sports are to be  avoided and one should avoid heavy lifting (defined as half of ideal body weight) and exercises involving sustained Valsalva maneuver.  5. Counseling for those suspected of having genetically mediated disease. First-degree relatives of those with TAA disease should be screened as well as those who have a connective tissue disease (I.e with Marfan syndrome, Ehlers-Danlos syndrome,  and Loeys-Dietz syndrome) or a  bicuspid aortic valve,have an increased risk for  complications related to TAA  6. If one has tobacco abuse, smoking cessation is highly encouraged.

## 2023-10-16 DIAGNOSIS — M4714 Other spondylosis with myelopathy, thoracic region: Secondary | ICD-10-CM | POA: Diagnosis not present

## 2023-10-23 ENCOUNTER — Ambulatory Visit: Payer: Medicare Other | Attending: Cardiology | Admitting: Cardiology

## 2023-10-23 ENCOUNTER — Encounter: Payer: Self-pay | Admitting: Cardiology

## 2023-10-23 VITALS — BP 112/60 | HR 54 | Ht 73.5 in | Wt 213.1 lb

## 2023-10-23 DIAGNOSIS — I255 Ischemic cardiomyopathy: Secondary | ICD-10-CM | POA: Insufficient documentation

## 2023-10-23 DIAGNOSIS — I447 Left bundle-branch block, unspecified: Secondary | ICD-10-CM | POA: Insufficient documentation

## 2023-10-23 DIAGNOSIS — I493 Ventricular premature depolarization: Secondary | ICD-10-CM | POA: Insufficient documentation

## 2023-10-23 DIAGNOSIS — I5022 Chronic systolic (congestive) heart failure: Secondary | ICD-10-CM | POA: Diagnosis not present

## 2023-10-23 DIAGNOSIS — I2510 Atherosclerotic heart disease of native coronary artery without angina pectoris: Secondary | ICD-10-CM | POA: Diagnosis not present

## 2023-10-23 DIAGNOSIS — Z79899 Other long term (current) drug therapy: Secondary | ICD-10-CM | POA: Diagnosis not present

## 2023-10-23 LAB — CUP PACEART INCLINIC DEVICE CHECK
Battery Remaining Longevity: 89 mo
Battery Voltage: 3 V
Brady Statistic AP VP Percent: 7.87 %
Brady Statistic AP VS Percent: 0.05 %
Brady Statistic AS VP Percent: 90.2 %
Brady Statistic AS VS Percent: 1.88 %
Brady Statistic RA Percent Paced: 7.89 %
Brady Statistic RV Percent Paced: 87.55 %
Date Time Interrogation Session: 20250210145105
HighPow Impedance: 67 Ohm
Implantable Lead Connection Status: 753985
Implantable Lead Connection Status: 753985
Implantable Lead Connection Status: 753985
Implantable Lead Implant Date: 20230222
Implantable Lead Implant Date: 20230222
Implantable Lead Implant Date: 20230222
Implantable Lead Location: 753858
Implantable Lead Location: 753859
Implantable Lead Location: 753860
Implantable Lead Model: 4798
Implantable Lead Model: 5076
Implantable Pulse Generator Implant Date: 20230222
Lead Channel Impedance Value: 212.8 Ohm
Lead Channel Impedance Value: 212.8 Ohm
Lead Channel Impedance Value: 220.723
Lead Channel Impedance Value: 237.12 Ohm
Lead Channel Impedance Value: 237.12 Ohm
Lead Channel Impedance Value: 399 Ohm
Lead Channel Impedance Value: 399 Ohm
Lead Channel Impedance Value: 456 Ohm
Lead Channel Impedance Value: 456 Ohm
Lead Channel Impedance Value: 494 Ohm
Lead Channel Impedance Value: 532 Ohm
Lead Channel Impedance Value: 589 Ohm
Lead Channel Impedance Value: 627 Ohm
Lead Channel Impedance Value: 703 Ohm
Lead Channel Impedance Value: 779 Ohm
Lead Channel Impedance Value: 817 Ohm
Lead Channel Impedance Value: 836 Ohm
Lead Channel Impedance Value: 836 Ohm
Lead Channel Pacing Threshold Amplitude: 0.5 V
Lead Channel Pacing Threshold Amplitude: 0.875 V
Lead Channel Pacing Threshold Amplitude: 1.75 V
Lead Channel Pacing Threshold Pulse Width: 0.4 ms
Lead Channel Pacing Threshold Pulse Width: 0.4 ms
Lead Channel Pacing Threshold Pulse Width: 0.4 ms
Lead Channel Sensing Intrinsic Amplitude: 0.625 mV
Lead Channel Sensing Intrinsic Amplitude: 1 mV
Lead Channel Sensing Intrinsic Amplitude: 17 mV
Lead Channel Sensing Intrinsic Amplitude: 18.25 mV
Lead Channel Setting Pacing Amplitude: 1.75 V
Lead Channel Setting Pacing Amplitude: 2 V
Lead Channel Setting Pacing Amplitude: 2.25 V
Lead Channel Setting Pacing Pulse Width: 0.4 ms
Lead Channel Setting Pacing Pulse Width: 0.4 ms
Lead Channel Setting Sensing Sensitivity: 0.3 mV
Zone Setting Status: 755011
Zone Setting Status: 755011

## 2023-10-23 NOTE — Progress Notes (Signed)
  Electrophysiology Office Note:   Date:  10/23/2023  ID:  Timothy Moran, DOB [birth year], MRN [id number]  Primary Cardiologist: Timothy Burger, MD Primary Heart Failure: None Electrophysiologist: Timothy Amos Cortland Ding, MD      History of Present Illness:   Timothy Moran is a 78 y.o. male with h/o ascending aortic aneurysm, CKD stage III, coronary artery disease, type 2 diabetes, chronic systolic heart failure, PE, hyperlipidemia, hypertension seen today for routine electrophysiology followup.   Since last being seen in our clinic the patient reports doing well from a cardiac perspective.  He has no cardiac awareness.  He is happy with how he has been doing.  He is going to start working with physical therapy.  He feels that he has been less active, and has lost some functional ability.  He has been trying to walk with a cane, but this has been difficult.  He is motivated to improve his physical activity..  he denies chest pain, palpitations, dyspnea, PND, orthopnea, nausea, vomiting, dizziness, syncope, edema, weight gain, or early satiety.   Review of systems complete and found to be negative unless listed in HPI.      EP Information / Studies Reviewed:    EKG is not ordered today. EKG from 09/22/2023 reviewed which showed atrial sensed, ventricular paced, PVCs      ICD Interrogation-  reviewed in detail today,  See PACEART report.  Device History: Medtronic BiV ICD implanted 11/03/21 for chronic systolic heart failure History of appropriate therapy: No History of AAD therapy: No   Risk Assessment/Calculations:              Physical Exam:   VS:  BP 112/60   Pulse (!) 54   Ht 6' 1.5" (1.867 m)   Wt 213 lb 1.9 oz (96.7 kg)   SpO2 95%   BMI 27.74 kg/m    Wt Readings from Last 3 Encounters:  10/23/23 213 lb 1.9 oz (96.7 kg)  09/22/23 218 lb (98.9 kg)  04/12/23 220 lb (99.8 kg)     GEN: Well nourished, well developed in no acute distress NECK: No JVD; No carotid  bruits CARDIAC: Regular rate and rhythm, no murmurs, rubs, gallops RESPIRATORY:  Clear to auscultation without rales, wheezing or rhonchi  ABDOMEN: Soft, non-tender, non-distended EXTREMITIES:  No edema; No deformity   ASSESSMENT AND PLAN:    Chronic systolic dysfunction s/p Medtronic CRT-D  euvolemic today Stable on an appropriate medical regimen Normal ICD function See Pace Art report No changes today  2.  Coronary artery disease: Post circumflex stents.  No current chest pain.  Cardiology.  Timothy Moran  3.  PVCs: Burden of biventricular pacing.  Currently on amiodarone  100 mg daily.  Biventricular pacing improved.  Continue current management.  4.  High risk medication monitoring: Currently on amiodarone .  Timothy Moran check TSH and LFTs today.   Disposition:   Follow up with Dr. Lawana Moran in 12 months   Signed, Timothy Igoe Cortland Ding, MD

## 2023-10-23 NOTE — Patient Instructions (Signed)
 Medication Instructions:  Your physician recommends that you continue on your current medications as directed. Please refer to the Current Medication list given to you today.  *If you need a refill on your cardiac medications before your next appointment, please call your pharmacy*   Lab Work: Your physician recommends that you return for lab work, TSH & LFT.  Stop by [facility].  You do NOT need to fasting.  If you have labs (blood work) drawn today and your tests are completely normal, you will receive your results only by: MyChart Message (if you have MyChart) OR A paper copy in the mail If you have any lab test that is abnormal or we need to change your treatment, we will call you to review the results.   Testing/Procedures: None ordered   Follow-Up: At [facility], you and your health needs are our priority.  As part of our continuing mission to provide you with exceptional heart care, we have created designated Provider Care Teams.  These Care Teams include your primary Cardiologist (physician) and Advanced Practice Providers (APPs -  Physician Assistants and Nurse Practitioners) who all work together to provide you with the care you need, when you need it.  We recommend signing up for the patient portal called "MyChart".  Sign up information is provided on this After Visit Summary.  MyChart is used to connect with patients for Virtual Visits (Telemedicine).  Patients are able to view lab/test results, encounter notes, upcoming appointments, etc.  Non-urgent messages can be sent to your provider as well.   To learn more about what you can do with MyChart, go to [email or web address].    Your next appointment:   1 year(s)  The format for your next appointment:   In Person  Provider:   Agatha Horsfall, MD    Thank you for choosing [facility]!!   Reece Cane, RN [phone]

## 2023-10-26 ENCOUNTER — Encounter: Payer: Self-pay | Admitting: Physical Therapy

## 2023-10-26 ENCOUNTER — Ambulatory Visit: Payer: Medicare Other | Attending: Family Medicine | Admitting: Physical Therapy

## 2023-10-26 ENCOUNTER — Other Ambulatory Visit: Payer: Self-pay

## 2023-10-26 DIAGNOSIS — R262 Difficulty in walking, not elsewhere classified: Secondary | ICD-10-CM | POA: Diagnosis not present

## 2023-10-26 DIAGNOSIS — R278 Other lack of coordination: Secondary | ICD-10-CM | POA: Insufficient documentation

## 2023-10-26 DIAGNOSIS — R2681 Unsteadiness on feet: Secondary | ICD-10-CM | POA: Insufficient documentation

## 2023-10-26 NOTE — Therapy (Signed)
OUTPATIENT PHYSICAL THERAPY LOWER EXTREMITY EVALUATION   Patient Name: Timothy Moran MRN: [id number] DOB:03-23-1946, 78 y.o., male Today's Date: 10/26/2023  END OF SESSION:  PT End of Session - 10/26/23 1347     Visit Number 1    Number of Visits 17    Date for PT Re-Evaluation 12/21/23    Authorization Type MCR    Progress Note Due on Visit 10    PT Start Time 1345    PT Stop Time 1425    PT Time Calculation (min) 40 min    Activity Tolerance Patient tolerated treatment well    Behavior During Therapy [facility] for tasks assessed/performed             Past Medical History:  Diagnosis Date   Acute blood loss anemia 10/05/2020   Acute respiratory failure (HCC), hypothermia therapy, vent - extubated 10/06/20    AKI (acute kidney injury) (HCC) 10/05/2020   Anoxic brain injury (HCC) 10/26/2020   Arrhythmia    Ascending aortic aneurysm (HCC) 01/29/2018   a. 2019 4.3 cm by echo; b. 02/2021 Echo: Ao root 40mm.   Benign brain tumor (HCC)    Benign neoplasm of brain (HCC) 12/19/2017   Cardiac arrest with ventricular fibrillation (HCC)    Chest pain 12/04/2017   Chronic kidney disease, stage III (moderate) (HCC) 12/19/2017   CKD (chronic kidney disease), stage III - IV (HCC)    Conductive hearing loss of both ears 08/24/2018   Coronary Artery Disease    a. 09/2020 Inf STEMI complicated by cardiac arrest/PCI: LAD 50p/m, LCX 100d (2.5 x 30 Resolute Onyx DES); b. 04/2021 PCI: 04/2021 LM nl, LAD 50p/9m (2.5x30 Onyx Frontier DES), LCX 25p, 55m (RFR 0.98), 40d ISR (RFR 0.98), OM2 25, RCA mild diff dzs.   Diabetes mellitus, type 2 (HCC) 10/06/2020   10/06/20 A1C 7%   Dizziness 10/11/2019   Encounter for imaging study to confirm orogastric (OG) tube placement    Eustachian tube dysfunction 08/24/2018   Hematuria 10/26/2020   HFrEF (heart failure with reduced ejection fraction) (HCC)    a. 11/2020 Echo: EF 25-30%; b. 02/2021 Echo: EF 25-30%, glob HK. Mild LVH. Nl RV fxn. Triv AI. Ao root  40mm.   History of pulmonary embolus (PE)    HLD (hyperlipidemia) 10/26/2020   Hypertension    Iron deficiency anemia rec'd IV iron 10/26/2020   Ischemic cardiomyopathy    a. 11/2020 Echo: EF 25-30%; b. 02/2021 Echo: EF 25-30%, glob HK.   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 Marland KitchenSpirometry 03/08/2018    FEV1 3.86 (108%)  Ratio 96 s prior rx  - PET  03/13/18   Low grade c/w adenoca > rec T surgery eval     STEMI (ST elevation myocardial infarction) (HCC) 10/04/2020   Urinary retention 10/26/2020   Vestibular  schwannoma (HCC) 08/14/2020   Past Surgical History:  Procedure Laterality Date   APPENDECTOMY     BIV ICD INSERTION CRT-D N/A 11/03/2021   Procedure: BIV ICD INSERTION CRT-D;  Surgeon: Regan Lemming, MD;  Location: [facility] INVASIVE CV LAB;  Service: Cardiovascular;  Laterality: N/A;   CARDIAC CATHETERIZATION     CORONARY ANGIOPLASTY WITH STENT PLACEMENT Left 04/12/2021   LAD stent placement   CORONARY PRESSURE/FFR STUDY N/A 04/12/2021   Procedure: INTRAVASCULAR PRESSURE WIRE/FFR STUDY;  Surgeon: Yvonne Kendall, MD;  Location: [facility] INVASIVE CV LAB;  Service: Cardiovascular;  Laterality: N/A;   CORONARY STENT INTERVENTION N/A 04/12/2021   Procedure: CORONARY STENT INTERVENTION;  Surgeon: Yvonne Kendall, MD;  Location: [facility] INVASIVE CV LAB;  Service: Cardiovascular;   Laterality: N/A;   CORONARY ULTRASOUND/IVUS N/A 04/12/2021   Procedure: Intravascular Ultrasound/IVUS;  Surgeon: Yvonne Kendall, MD;  Location: [facility] INVASIVE CV LAB;  Service: Cardiovascular;  Laterality: N/A;   CORONARY/GRAFT ACUTE MI REVASCULARIZATION N/A 10/04/2020   Procedure: Coronary/Graft Acute MI Revascularization;  Surgeon: Yvonne Kendall, MD;  Location: [facility] INVASIVE CV LAB;  Service: Cardiovascular;  Laterality: N/A;   HERNIA REPAIR     LEFT HEART CATH AND CORONARY ANGIOGRAPHY N/A 10/04/2020   Procedure: LEFT HEART CATH AND CORONARY ANGIOGRAPHY;  Surgeon: Yvonne Kendall, MD;  Location: [facility] INVASIVE CV LAB;  Service: Cardiovascular;  Laterality: N/A;   RIGHT/LEFT HEART CATH AND CORONARY ANGIOGRAPHY N/A 04/12/2021   Procedure: RIGHT/LEFT HEART CATH AND CORONARY ANGIOGRAPHY;  Surgeon: Yvonne Kendall, MD;  Location: [facility] INVASIVE CV LAB;  Service: Cardiovascular;  Laterality: N/A;   Patient Active Problem List   Diagnosis Date Noted   Anemia    Leukocytosis    Right arm weakness 01/25/2022   Right sided weakness    Fall at home 01/22/2022   Status post biventricular pacemaker - MRI compatible PPM and leads 01/22/2022   Chronic indwelling Foley catheter 01/22/2022   CKD (chronic kidney disease), stage III - IV (HCC) 06/28/2021   Chronic HFrEF (heart failure with reduced ejection fraction) (HCC) 03/31/2021   Arrhythmia    DM (diabetes mellitus), type 2 (HCC) new 10/26/2020   Urinary retention 10/26/2020   Hyperlipidemia LDL goal <70 10/26/2020   Coronary artery disease of native artery of native heart with stable angina pectoris (HCC) 10/26/2020   S/P angioplasty with stent 10/04/20 DES to LCX  10/26/2020   Ischemic cardiomyopathy 10/26/2020   Diabetes mellitus, type 2 (HCC) 10/06/2020   DOE (dyspnea on exertion)    Meningioma (HCC) 08/14/2020   Vestibular schwannoma (HCC) 08/14/2020   Cardiac arrest with ventricular fibrillation (HCC)    Pain of left hip joint 05/29/2020   Benign  brain tumor (HCC)    Chronic kidney disease    History of pulmonary embolus (PE)    Hypertension    Nonsustained ventricular tachycardia (HCC) 10/11/2019   Dizziness 10/11/2019   Abnormal echocardiogram 06/07/2019   Eustachian tube dysfunction 08/24/2018   Conductive hearing loss of both ears 08/24/2018   Pulmonary hypertension due to thromboembolism (HCC) 03/09/2018   Solitary pulmonary nodule on lung CT 03/08/2018   Ascending aortic aneurysm (HCC) 4.2 cm based on CT from December 2020 01/29/2018   Sinus bradycardia 12/22/2017   Left bundle branch block 12/19/2017   Personal history of pulmonary embolism - no longer on anticoagulation due to pt refusal to take it anymore. 12/19/2017   Chronic kidney disease, stage III (moderate) (HCC) 12/19/2017   Benign neoplasm of brain (HCC) 12/19/2017    PCP: Corliss Blacker,  Toniann Fail, MD  REFERRING PROVIDER: Gweneth Dimitri, MD  REFERRING DIAG: Free Text Diagnosis R29.6, R26.89, R26.9 Mult. Falls, Poor Balance, Gait Disturbance  THERAPY DIAG:  Unsteadiness on feet  Other lack of coordination  Difficulty in walking, not elsewhere classified  Rationale for Evaluation and Treatment: Rehabilitation  ONSET DATE: chronic   SUBJECTIVE:   SUBJECTIVE STATEMENT:  Having issues with balance and walking. I do have a meningioma in my spine, and every once in awhile I have pain if I twist or stress it. Still having pain in my neck and hip from a fall that I had about a year and a half ago. Thought that pain would go away but it hasn't even this far out/being this healed. I have a lot of unsteadiness, not sure I'd call it dizziness but I have to stand for a minute before I get going. Have a couple of walking sticks/canes but not sure how to use them honestly. Entire R LE and UE are  still numb. When I fell I was at a friend's house and were leaving, about an hour before that I had a glass of wine, I went out first and held the door for the next people but didn't  realize that the concrete platform ended and dropped about 15 inches and ended up stepping off that 15 inch ledge and hit my head/shoulder/hip/knee on the wall beyond that. Meningioma is still just being monitored, saw the MD and they said its not growing but its just there in the spine. Every single movement I make with my neck hurts when I get to the end of that range.   PERTINENT HISTORY: See above- complex medical hx  PAIN:  Are you having pain? No 0/10 at rest, with movements up to 7-8/10 especially twisting   PRECAUTIONS: Fall and Other: caution with movements, some movements can cause increased pressure/pain at meningioma site, ICD    RED FLAGS: None   WEIGHT BEARING RESTRICTIONS: No  FALLS:  Has patient fallen in last 6 months? Yes. Number of falls 1- had a mis-step coming off a ladder, fell backwards and hit the floor pretty hard  LIVING ENVIRONMENT: Lives with: lives with their spouse Lives in: House/apartment Stairs:  5 STE with B rails, has second floor in his home but holds rail    OCCUPATION: retired   PLOF: Independent, Independent with basic ADLs, Independent with gait, and Independent with transfers  PATIENT GOALS: getting more active, improving balance   NEXT MD VISIT: Referring scheduled but unsure when   OBJECTIVE:  Note: Objective measures were completed at Evaluation unless otherwise noted.    PATIENT SURVEYS:  ABC scale 65.6%  COGNITION: Overall cognitive status: Within functional limits for tasks assessed     SENSATION: Complete R UE and R LE numbness (chronic)    COORDINATION  Noted some ataxia in R LE with functional movements   LOWER EXTREMITY MMT:  MMT Right eval Left eval  Hip flexion 4+ 5  Hip extension    Hip abduction 5 5  Hip adduction    Hip internal rotation    Hip external rotation    Knee flexion 5 5  Knee extension 5 5  Ankle dorsiflexion 5 5  Ankle plantarflexion    Ankle inversion    Ankle eversion     (Blank  rows = not tested)   FUNCTIONAL TESTS:  5 times sit to stand: 15.4 seconds use of UEs, some difficulty stabilizing Timed up and go (TUG): 14.2 seconds  no device but did need MinA for balance when initially standing and then turning to sit in the chair at end of test  3 minute walk test: 6101ft no device but needed MinA at times for balance on corners, RPE 4/10  GAIT: Distance walked: 633ft Assistive device utilized: None Level of assistance: Min A Comments: MinA for balance with turns and when navigating obstacles, some ataxic movements noted                                                                                                                                 TREATMENT DATE:   10/26/23  Eval, care planning     PATIENT EDUCATION:  Education details: exam findings, POC Person educated: Patient Education method: Explanation, Demonstration, and Verbal cues Education comprehension: verbalized understanding, returned demonstration, and needs further education  HOME EXERCISE PROGRAM: TBD   ASSESSMENT:  CLINICAL IMPRESSION: Patient is a 78 y.o. M who was seen today for physical therapy evaluation and treatment for Free Text Diagnosis R29.6, R26.89, R26.9 Mult. Falls, Poor Balance, Gait Disturbance. Very pleasant and cooperative but very talkative during eval which did limit assessment time. Still having a lot of pain in general since his fall  and ongoing numbness in R UE/LE. Gait mechanics and balance are definitely impaired, would benefit from training with appropriate AD if he is open to this. Will benefit from skilled PT services to address his concerns and functional objective impairments to the best of our ability.   OBJECTIVE IMPAIRMENTS: Abnormal gait, cardiopulmonary status limiting activity, decreased activity tolerance, decreased balance, decreased mobility, and difficulty walking.   ACTIVITY LIMITATIONS: standing, squatting, stairs, transfers, reach over head, and  locomotion level  PARTICIPATION LIMITATIONS: driving, shopping, community activity, and occupation  PERSONAL FACTORS: Age, Behavior pattern, Fitness, Past/current experiences, Social background, and Time since onset of injury/illness/exacerbation are also affecting patient's functional outcome.   REHAB POTENTIAL: Good  CLINICAL DECISION MAKING: Evolving/moderate complexity  EVALUATION COMPLEXITY: Moderate   GOALS: Goals reviewed with patient? No  SHORT TERM GOALS: Target date: 11/23/2023   Will be compliant with appropriate progressive HEP  Baseline: Goal status: INITIAL  2.  Will be able to name 3 ways to prevent falls at home and in the community  Baseline:  Goal status: INITIAL  3.  Will be able to perform 5xSTS and with distant S Baseline: MinA  Goal status: INITIAL  4.  Will complete TUG test in 12 seconds or less S  Baseline: MinA  Goal status: INITIAL    LONG TERM GOALS: Target date: 12/21/2023    Will score at least 18 on DGI to show improved functional balance  Baseline:  Goal status: INITIAL  2.  Will be able to ambulate community distances (level and inclines) no device, RPE no more than 2/10  Baseline:  Goal status: INITIAL  3.  Will be able to perform all desired gym activities without increased pain from resting  levels  Baseline:  Goal status: INITIAL  4.  ABC score to improve by at least 20 points to show improved subjective status  Baseline:  Goal status: INITIAL     PLAN:  PT FREQUENCY: 2x/week  PT DURATION: 8 weeks  PLANNED INTERVENTIONS: 97164- PT Re-evaluation, 97110-Therapeutic exercises, 97530- Therapeutic activity, [id number]- Neuromuscular re-education, 97535- Self Care, [id number]- Manual therapy, [phone]- Gait training, [phone]- Aquatic Therapy, and Balance training  PLAN FOR NEXT SESSION: check orthostatics, balance and coordination, functional activity tolerance   Nedra Hai, PT, DPT 10/26/23 2:46 PM

## 2023-10-30 DIAGNOSIS — M1711 Unilateral primary osteoarthritis, right knee: Secondary | ICD-10-CM | POA: Diagnosis not present

## 2023-11-07 ENCOUNTER — Ambulatory Visit (INDEPENDENT_AMBULATORY_CARE_PROVIDER_SITE_OTHER): Payer: Medicare Other

## 2023-11-07 DIAGNOSIS — I255 Ischemic cardiomyopathy: Secondary | ICD-10-CM

## 2023-11-08 LAB — CUP PACEART REMOTE DEVICE CHECK
Battery Remaining Longevity: 89 mo
Battery Voltage: 3 V
Brady Statistic AP VP Percent: 7.82 %
Brady Statistic AP VS Percent: 0.04 %
Brady Statistic AS VP Percent: 89.91 %
Brady Statistic AS VS Percent: 2.23 %
Brady Statistic RA Percent Paced: 7.83 %
Brady Statistic RV Percent Paced: 88.74 %
Date Time Interrogation Session: 20250225042506
HighPow Impedance: 64 Ohm
Implantable Lead Connection Status: 753985
Implantable Lead Connection Status: 753985
Implantable Lead Connection Status: 753985
Implantable Lead Implant Date: 20230222
Implantable Lead Implant Date: 20230222
Implantable Lead Implant Date: 20230222
Implantable Lead Location: 753858
Implantable Lead Location: 753859
Implantable Lead Location: 753860
Implantable Lead Model: 4798
Implantable Lead Model: 5076
Implantable Pulse Generator Implant Date: 20230222
Lead Channel Impedance Value: 220.723
Lead Channel Impedance Value: 224.438
Lead Channel Impedance Value: 237.865
Lead Channel Impedance Value: 251.66 Ohm
Lead Channel Impedance Value: 268.667
Lead Channel Impedance Value: 380 Ohm
Lead Channel Impedance Value: 399 Ohm
Lead Channel Impedance Value: 494 Ohm
Lead Channel Impedance Value: 513 Ohm
Lead Channel Impedance Value: 513 Ohm
Lead Channel Impedance Value: 570 Ohm
Lead Channel Impedance Value: 589 Ohm
Lead Channel Impedance Value: 703 Ohm
Lead Channel Impedance Value: 779 Ohm
Lead Channel Impedance Value: 817 Ohm
Lead Channel Impedance Value: 855 Ohm
Lead Channel Impedance Value: 855 Ohm
Lead Channel Impedance Value: 912 Ohm
Lead Channel Pacing Threshold Amplitude: 0.5 V
Lead Channel Pacing Threshold Amplitude: 0.875 V
Lead Channel Pacing Threshold Amplitude: 1.5 V
Lead Channel Pacing Threshold Pulse Width: 0.4 ms
Lead Channel Pacing Threshold Pulse Width: 0.4 ms
Lead Channel Pacing Threshold Pulse Width: 0.4 ms
Lead Channel Sensing Intrinsic Amplitude: 0.75 mV
Lead Channel Sensing Intrinsic Amplitude: 0.75 mV
Lead Channel Sensing Intrinsic Amplitude: 18.25 mV
Lead Channel Sensing Intrinsic Amplitude: 18.25 mV
Lead Channel Setting Pacing Amplitude: 1.75 V
Lead Channel Setting Pacing Amplitude: 2 V
Lead Channel Setting Pacing Amplitude: 2 V
Lead Channel Setting Pacing Pulse Width: 0.4 ms
Lead Channel Setting Pacing Pulse Width: 0.4 ms
Lead Channel Setting Sensing Sensitivity: 0.3 mV
Zone Setting Status: 755011
Zone Setting Status: 755011

## 2023-11-16 ENCOUNTER — Ambulatory Visit: Payer: Medicare Other | Attending: Family Medicine | Admitting: Physical Therapy

## 2023-11-16 ENCOUNTER — Encounter: Payer: Self-pay | Admitting: Physical Therapy

## 2023-11-16 DIAGNOSIS — R278 Other lack of coordination: Secondary | ICD-10-CM

## 2023-11-16 DIAGNOSIS — M6281 Muscle weakness (generalized): Secondary | ICD-10-CM | POA: Diagnosis not present

## 2023-11-16 DIAGNOSIS — R2681 Unsteadiness on feet: Secondary | ICD-10-CM | POA: Diagnosis not present

## 2023-11-16 DIAGNOSIS — R262 Difficulty in walking, not elsewhere classified: Secondary | ICD-10-CM | POA: Diagnosis not present

## 2023-11-16 DIAGNOSIS — R296 Repeated falls: Secondary | ICD-10-CM | POA: Diagnosis not present

## 2023-11-16 DIAGNOSIS — R531 Weakness: Secondary | ICD-10-CM | POA: Diagnosis not present

## 2023-11-16 NOTE — Therapy (Signed)
 OUTPATIENT PHYSICAL THERAPY LOWER EXTREMITY TREATMENT    Patient Name: Timothy Moran MRN: [id number] DOB:1945/10/11, 78 y.o., male Today's Date: 11/16/2023  END OF SESSION:  PT End of Session - 11/16/23 1149     Visit Number 2    Number of Visits 17    Date for PT Re-Evaluation 12/21/23    Authorization Type MCR    Progress Note Due on Visit 10    PT Start Time 1147    PT Stop Time 1227    PT Time Calculation (min) 40 min    Activity Tolerance Patient tolerated treatment well    Behavior During Therapy [facility] for tasks assessed/performed              Past Medical History:  Diagnosis Date   Acute blood loss anemia 10/05/2020   Acute respiratory failure (HCC), hypothermia therapy, vent - extubated 10/06/20    AKI (acute kidney injury) (HCC) 10/05/2020   Anoxic brain injury (HCC) 10/26/2020   Arrhythmia    Ascending aortic aneurysm (HCC) 01/29/2018   a. 2019 4.3 cm by echo; b. 02/2021 Echo: Ao root 40mm.   Benign brain tumor (HCC)    Benign neoplasm of brain (HCC) 12/19/2017   Cardiac arrest with ventricular fibrillation (HCC)    Chest pain 12/04/2017   Chronic kidney disease, stage III (moderate) (HCC) 12/19/2017   CKD (chronic kidney disease), stage III - IV (HCC)    Conductive hearing loss of both ears 08/24/2018   Coronary Artery Disease    a. 09/2020 Inf STEMI complicated by cardiac arrest/PCI: LAD 50p/m, LCX 100d (2.5 x 30 Resolute Onyx DES); b. 04/2021 PCI: 04/2021 LM nl, LAD 50p/67m (2.5x30 Onyx Frontier DES), LCX 25p, 41m (RFR 0.98), 40d ISR (RFR 0.98), OM2 25, RCA mild diff dzs.   Diabetes mellitus, type 2 (HCC) 10/06/2020   10/06/20 A1C 7%   Dizziness 10/11/2019   Encounter for imaging study to confirm orogastric (OG) tube placement    Eustachian tube dysfunction 08/24/2018   Hematuria 10/26/2020   HFrEF (heart failure with reduced ejection fraction) (HCC)    a. 11/2020 Echo: EF 25-30%; b. 02/2021 Echo: EF 25-30%, glob HK. Mild LVH. Nl RV fxn. Triv AI. Ao root  40mm.   History of pulmonary embolus (PE)    HLD (hyperlipidemia) 10/26/2020   Hypertension    Iron deficiency anemia rec'd IV iron 10/26/2020   Ischemic cardiomyopathy    a. 11/2020 Echo: EF 25-30%; b. 02/2021 Echo: EF 25-30%, glob HK.   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 Marland KitchenSpirometry 03/08/2018    FEV1 3.86 (108%)  Ratio 96 s prior rx  - PET  03/13/18   Low grade c/w adenoca > rec T surgery eval     STEMI (ST elevation myocardial infarction) (HCC) 10/04/2020   Urinary retention 10/26/2020  Vestibular schwannoma (HCC) 08/14/2020   Past Surgical History:  Procedure Laterality Date   APPENDECTOMY     BIV ICD INSERTION CRT-D N/A 11/03/2021   Procedure: BIV ICD INSERTION CRT-D;  Surgeon: Regan Lemming, MD;  Location[facility] INVASIVE CV LAB;  Service: Cardiovascular;  Laterality: N/A;   CARDIAC CATHETERIZATION     CORONARY ANGIOPLASTY WITH STENT PLACEMENT Left 04/12/2021   LAD stent placement   CORONARY PRESSURE/FFR STUDY N/A 04/12/2021   Procedure: INTRAVASCULAR PRESSURE WIRE/FFR STUDY;  Surgeon: Yvonne Kendall, MD;  Location: [facility] INVASIVE CV LAB;  Service: Cardiovascular;  Laterality: N/A;   CORONARY STENT INTERVENTION N/A 04/12/2021   Procedure: CORONARY STENT INTERVENTION;  Surgeon: Yvonne Kendall, MD;  Location: [facility] INVASIVE CV LAB;  Service: Cardiovascular;   Laterality: N/A;   CORONARY ULTRASOUND/IVUS N/A 04/12/2021   Procedure: Intravascular Ultrasound/IVUS;  Surgeon: Yvonne Kendall, MD;  Location: [facility] INVASIVE CV LAB;  Service: Cardiovascular;  Laterality: N/A;   CORONARY/GRAFT ACUTE MI REVASCULARIZATION N/A 10/04/2020   Procedure: Coronary/Graft Acute MI Revascularization;  Surgeon: Yvonne Kendall, MD;  Location: [facility] INVASIVE CV LAB;  Service: Cardiovascular;  Laterality: N/A;   HERNIA REPAIR     LEFT HEART CATH AND CORONARY ANGIOGRAPHY N/A 10/04/2020   Procedure: LEFT HEART CATH AND CORONARY ANGIOGRAPHY;  Surgeon: Yvonne Kendall, MD;  Location: [facility] INVASIVE CV LAB;  Service: Cardiovascular;  Laterality: N/A;   RIGHT/LEFT HEART CATH AND CORONARY ANGIOGRAPHY N/A 04/12/2021   Procedure: RIGHT/LEFT HEART CATH AND CORONARY ANGIOGRAPHY;  Surgeon: Yvonne Kendall, MD;  Location: [facility] INVASIVE CV LAB;  Service: Cardiovascular;  Laterality: N/A;   Patient Active Problem List   Diagnosis Date Noted   Anemia    Leukocytosis    Right arm weakness 01/25/2022   Right sided weakness    Fall at home 01/22/2022   Status post biventricular pacemaker - MRI compatible PPM and leads 01/22/2022   Chronic indwelling Foley catheter 01/22/2022   CKD (chronic kidney disease), stage III - IV (HCC) 06/28/2021   Chronic HFrEF (heart failure with reduced ejection fraction) (HCC) 03/31/2021   Arrhythmia    DM (diabetes mellitus), type 2 (HCC) new 10/26/2020   Urinary retention 10/26/2020   Hyperlipidemia LDL goal <70 10/26/2020   Coronary artery disease of native artery of native heart with stable angina pectoris (HCC) 10/26/2020   S/P angioplasty with stent 10/04/20 DES to LCX  10/26/2020   Ischemic cardiomyopathy 10/26/2020   Diabetes mellitus, type 2 (HCC) 10/06/2020   DOE (dyspnea on exertion)    Meningioma (HCC) 08/14/2020   Vestibular schwannoma (HCC) 08/14/2020   Cardiac arrest with ventricular fibrillation (HCC)    Pain of left hip joint 05/29/2020   Benign  brain tumor (HCC)    Chronic kidney disease    History of pulmonary embolus (PE)    Hypertension    Nonsustained ventricular tachycardia (HCC) 10/11/2019   Dizziness 10/11/2019   Abnormal echocardiogram 06/07/2019   Eustachian tube dysfunction 08/24/2018   Conductive hearing loss of both ears 08/24/2018   Pulmonary hypertension due to thromboembolism (HCC) 03/09/2018   Solitary pulmonary nodule on lung CT 03/08/2018   Ascending aortic aneurysm (HCC) 4.2 cm based on CT from December 2020 01/29/2018   Sinus bradycardia 12/22/2017   Left bundle branch block 12/19/2017   Personal history of pulmonary embolism - no longer on anticoagulation due to pt refusal to take it anymore. 12/19/2017   Chronic kidney disease, stage III (moderate) (HCC) 12/19/2017   Benign neoplasm of brain (HCC) 12/19/2017    PCP:  Gweneth Dimitri, MD  REFERRING PROVIDER: Gweneth Dimitri, MD  REFERRING DIAG: Free Text Diagnosis R29.6, R26.89, R26.9 Mult. Falls, Poor Balance, Gait Disturbance  THERAPY DIAG:  Unsteadiness on feet  Other lack of coordination  Difficulty in walking, not elsewhere classified  Muscle weakness (generalized)  Rationale for Evaluation and Treatment: Rehabilitation  ONSET DATE: chronic   SUBJECTIVE:   SUBJECTIVE STATEMENT:    Nothing new going on, having same old issues. Got back from [place] last night from 78th birthday trip. Flew up but wish we drove, took 12 hours all in all.      EVAL: Having issues with balance and walking. I do have a meningioma in my spine, and every once in awhile I have pain if I twist or stress it. Still having pain in my neck and hip from a fall that I had about a year and a half ago. Thought that pain would go away but it hasn't even this far out/being this healed. I have a lot of unsteadiness, not sure I'd call it dizziness but I have to stand for a minute before I get going. Have a couple of walking sticks/canes but not sure how to use them honestly.  Entire R LE and UE are  still numb. When I fell I was at a friend's house and were leaving, about an hour before that I had a glass of wine, I went out first and held the door for the next people but didn't realize that the concrete platform ended and dropped about 15 inches and ended up stepping off that 15 inch ledge and hit my head/shoulder/hip/knee on the wall beyond that. Meningioma is still just being monitored, saw the MD and they said its not growing but its just there in the spine. Every single movement I make with my neck hurts when I get to the end of that range.   PERTINENT HISTORY: See above- complex medical hx  PAIN:  Are you having pain? No  PRECAUTIONS: Fall and Other: caution with movements, some movements can cause increased pressure/pain at meningioma site, ICD    RED FLAGS: None   WEIGHT BEARING RESTRICTIONS: No  FALLS:  Has patient fallen in last 6 months? Yes. Number of falls 1- had a mis-step coming off a ladder, fell backwards and hit the floor pretty hard  LIVING ENVIRONMENT: Lives with: lives with their spouse Lives in: House/apartment Stairs:  5 STE with B rails, has second floor in his home but holds rail    OCCUPATION: retired   PLOF: Independent, Independent with basic ADLs, Independent with gait, and Independent with transfers  PATIENT GOALS: getting more active, improving balance   NEXT MD VISIT: Referring scheduled but unsure when   OBJECTIVE:  Note: Objective measures were completed at Evaluation unless otherwise noted.    PATIENT SURVEYS:  ABC scale 65.6%  COGNITION: Overall cognitive status: Within functional limits for tasks assessed     SENSATION: Complete R UE and R LE numbness (chronic)    COORDINATION  Noted some ataxia in R LE with functional movements   LOWER EXTREMITY MMT:  MMT Right eval Left eval  Hip flexion 4+ 5  Hip extension    Hip abduction 5 5  Hip adduction    Hip internal rotation    Hip external  rotation    Knee flexion 5 5  Knee extension 5 5  Ankle dorsiflexion 5 5  Ankle plantarflexion    Ankle inversion    Ankle eversion     (  Blank rows = not tested)   FUNCTIONAL TESTS:  5 times sit to stand: 15.4 seconds use of UEs, some difficulty stabilizing Timed up and go (TUG): 14.2 seconds no device but did need MinA for balance when initially standing and then turning to sit in the chair at end of test  3 minute walk test: 667ft no device but needed MinA at times for balance on corners, RPE 4/10  GAIT: Distance walked: 677ft Assistive device utilized: None Level of assistance: Min A Comments: MinA for balance with turns and when navigating obstacles, some ataxic movements noted                                                                                                                                 TREATMENT DATE:   11/16/23  Orthostatic BP: Supine 132/87 HR 57 Sitting 119/88 HR 64 Standing 134/86 HR 71 Standing x3 minutes 149/90 HR 69 Nustep L5x8 minutes all four extremities for functional activity tolerance   Wide tandem stance 3x30 seconds  Romberg stance 3x30 seconds    10/26/23  Eval, care planning     PATIENT EDUCATION:  Education details: exam findings, POC Person educated: Patient Education method: Explanation, Demonstration, and Verbal cues Education comprehension: verbalized understanding, returned demonstration, and needs further education  HOME EXERCISE PROGRAM:  Access Code: [email or web address] URL: [email or web address] Date: 11/16/2023 Prepared by: Nedra Hai  Exercises - Wide Tandem Stance with Eyes Open  - 1 x daily - 7 x weekly - 2 sets - 3 reps - 30 seconds  hold - Romberg Stance  - 1 x daily - 7 x weekly - 2 sets - 3 reps - 30 second  hold    ASSESSMENT:  CLINICAL IMPRESSION:  Started session by getting orthostatic BPs, no dizziness noted and orthostatic findings were negative today. Otherwise worked on functional  activity tolerance and balance training today, did well. Took rest breaks PRN. Will continue to progress as tolerated.      EVAL: Patient is a 78 y.o. M who was seen today for physical therapy evaluation and treatment for Free Text Diagnosis R29.6, R26.89, R26.9 Mult. Falls, Poor Balance, Gait Disturbance. Very pleasant and cooperative but very talkative during eval which did limit assessment time. Still having a lot of pain in general since his fall  and ongoing numbness in R UE/LE. Gait mechanics and balance are definitely impaired, would benefit from training with appropriate AD if he is open to this. Will benefit from skilled PT services to address his concerns and functional objective impairments to the best of our ability.   OBJECTIVE IMPAIRMENTS: Abnormal gait, cardiopulmonary status limiting activity, decreased activity tolerance, decreased balance, decreased mobility, and difficulty walking.   ACTIVITY LIMITATIONS: standing, squatting, stairs, transfers, reach over head, and locomotion level  PARTICIPATION LIMITATIONS: driving, shopping, community activity, and occupation  PERSONAL FACTORS: Age, Behavior pattern, Fitness, Past/current experiences, Social background, and Time since onset of injury/illness/exacerbation are also affecting  patient's functional outcome.   REHAB POTENTIAL: Good  CLINICAL DECISION MAKING: Evolving/moderate complexity  EVALUATION COMPLEXITY: Moderate   GOALS: Goals reviewed with patient? No  SHORT TERM GOALS: Target date: 11/23/2023   Will be compliant with appropriate progressive HEP  Baseline: Goal status: INITIAL  2.  Will be able to name 3 ways to prevent falls at home and in the community  Baseline:  Goal status: INITIAL  3.  Will be able to perform 5xSTS and with distant S Baseline: MinA  Goal status: INITIAL  4.  Will complete TUG test in 12 seconds or less S  Baseline: MinA  Goal status: INITIAL    LONG TERM GOALS: Target  date: 12/21/2023    Will score at least 18 on DGI to show improved functional balance  Baseline:  Goal status: INITIAL  2.  Will be able to ambulate community distances (level and inclines) no device, RPE no more than 2/10  Baseline:  Goal status: INITIAL  3.  Will be able to perform all desired gym activities without increased pain from resting levels  Baseline:  Goal status: INITIAL  4.  ABC score to improve by at least 20 points to show improved subjective status  Baseline:  Goal status: INITIAL     PLAN:  PT FREQUENCY: 2x/week  PT DURATION: 8 weeks  PLANNED INTERVENTIONS: [id number]- PT Re-evaluation, 97110-Therapeutic exercises, 97530- Therapeutic activity, 97112- Neuromuscular re-education, 97535- Self Care, [id number]- Manual therapy, [id number]- Gait training, [phone]- Aquatic Therapy, and Balance training  PLAN FOR NEXT SESSION: balance and coordination, functional activity tolerance, add more to HEP   Nedra Hai, PT, DPT 11/16/23 12:32 PM

## 2023-11-20 ENCOUNTER — Ambulatory Visit: Payer: Medicare Other | Admitting: Physical Therapy

## 2023-11-20 ENCOUNTER — Encounter: Payer: Self-pay | Admitting: Physical Therapy

## 2023-11-20 DIAGNOSIS — M6281 Muscle weakness (generalized): Secondary | ICD-10-CM | POA: Diagnosis not present

## 2023-11-20 DIAGNOSIS — R278 Other lack of coordination: Secondary | ICD-10-CM

## 2023-11-20 DIAGNOSIS — R531 Weakness: Secondary | ICD-10-CM | POA: Diagnosis not present

## 2023-11-20 DIAGNOSIS — R262 Difficulty in walking, not elsewhere classified: Secondary | ICD-10-CM

## 2023-11-20 DIAGNOSIS — R296 Repeated falls: Secondary | ICD-10-CM | POA: Diagnosis not present

## 2023-11-20 DIAGNOSIS — R2681 Unsteadiness on feet: Secondary | ICD-10-CM | POA: Diagnosis not present

## 2023-11-20 NOTE — Therapy (Signed)
 OUTPATIENT PHYSICAL THERAPY LOWER EXTREMITY TREATMENT    Patient Name: Timothy Moran MRN: [id number] DOB:[birth year], 78 y.o., male Today's Date: 11/20/2023  END OF SESSION:  PT End of Session - 11/20/23 1155     Visit Number 3    Number of Visits 17    Date for PT Re-Evaluation 12/21/23    Authorization Type MCR    Progress Note Due on Visit 10    PT Start Time 1147    PT Stop Time 1226    PT Time Calculation (min) 39 min    Activity Tolerance Patient tolerated treatment well    Behavior During Therapy [facility] for tasks assessed/performed               Past Medical History:  Diagnosis Date   Acute blood loss anemia 10/05/2020   Acute respiratory failure (HCC), hypothermia therapy, vent - extubated 10/06/20    AKI (acute kidney injury) (HCC) 10/05/2020   Anoxic brain injury (HCC) 10/26/2020   Arrhythmia    Ascending aortic aneurysm (HCC) 01/29/2018   a. 2019 4.3 cm by echo; b. 02/2021 Echo: Ao root 40mm.   Benign brain tumor (HCC)    Benign neoplasm of brain (HCC) 12/19/2017   Cardiac arrest with ventricular fibrillation (HCC)    Chest pain 12/04/2017   Chronic kidney disease, stage III (moderate) (HCC) 12/19/2017   CKD (chronic kidney disease), stage III - IV (HCC)    Conductive hearing loss of both ears 08/24/2018   Coronary Artery Disease    a. 09/2020 Inf STEMI complicated by cardiac arrest/PCI: LAD 50p/m, LCX 100d (2.5 x 30 Resolute Onyx DES); b. 04/2021 PCI: 04/2021 LM nl, LAD 50p/17m (2.5x30 Onyx Frontier DES), LCX 25p, 58m (RFR 0.98), 40d ISR (RFR 0.98), OM2 25, RCA mild diff dzs.   Diabetes mellitus, type 2 (HCC) 10/06/2020   10/06/20 A1C 7%   Dizziness 10/11/2019   Encounter for imaging study to confirm orogastric (OG) tube placement    Eustachian tube dysfunction 08/24/2018   Hematuria 10/26/2020   HFrEF (heart failure with reduced ejection fraction) (HCC)    a. 11/2020 Echo: EF 25-30%; b. 02/2021 Echo: EF 25-30%, glob HK. Mild LVH. Nl RV fxn. Triv AI. Ao root  40mm.   History of pulmonary embolus (PE)    HLD (hyperlipidemia) 10/26/2020   Hypertension    Iron deficiency anemia rec'd IV iron 10/26/2020   Ischemic cardiomyopathy    a. 11/2020 Echo: EF 25-30%; b. 02/2021 Echo: EF 25-30%, glob HK.   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 Marland KitchenSpirometry 03/08/2018    FEV1 3.86 (108%)  Ratio 96 s prior rx  - PET  03/13/18   Low grade c/w adenoca > rec T surgery eval     STEMI (ST elevation myocardial infarction) (HCC) 10/04/2020   Urinary retention 10/26/2020  Vestibular schwannoma (HCC) 08/14/2020   Past Surgical History:  Procedure Laterality Date   APPENDECTOMY     BIV ICD INSERTION CRT-D N/A 11/03/2021   Procedure: BIV ICD INSERTION CRT-D;  Surgeon: Regan Lemming, MD;  Location: [facility] INVASIVE CV LAB;  Service: Cardiovascular;  Laterality: N/A;   CARDIAC CATHETERIZATION     CORONARY ANGIOPLASTY WITH STENT PLACEMENT Left 04/12/2021   LAD stent placement   CORONARY PRESSURE/FFR STUDY N/A 04/12/2021   Procedure: INTRAVASCULAR PRESSURE WIRE/FFR STUDY;  Surgeon: Yvonne Kendall, MD;  Location: [facility] INVASIVE CV LAB;  Service: Cardiovascular;  Laterality: N/A;   CORONARY STENT INTERVENTION N/A 04/12/2021   Procedure: CORONARY STENT INTERVENTION;  Surgeon: Yvonne Kendall, MD;  Location: [facility] INVASIVE CV LAB;  Service: Cardiovascular;   Laterality: N/A;   CORONARY ULTRASOUND/IVUS N/A 04/12/2021   Procedure: Intravascular Ultrasound/IVUS;  Surgeon: Yvonne Kendall, MD;  Location: [facility] INVASIVE CV LAB;  Service: Cardiovascular;  Laterality: N/A;   CORONARY/GRAFT ACUTE MI REVASCULARIZATION N/A 10/04/2020   Procedure: Coronary/Graft Acute MI Revascularization;  Surgeon: Yvonne Kendall, MD;  Location: [facility] INVASIVE CV LAB;  Service: Cardiovascular;  Laterality: N/A;   HERNIA REPAIR     LEFT HEART CATH AND CORONARY ANGIOGRAPHY N/A 10/04/2020   Procedure: LEFT HEART CATH AND CORONARY ANGIOGRAPHY;  Surgeon: Yvonne Kendall, MD;  Location: [facility] INVASIVE CV LAB;  Service: Cardiovascular;  Laterality: N/A;   RIGHT/LEFT HEART CATH AND CORONARY ANGIOGRAPHY N/A 04/12/2021   Procedure: RIGHT/LEFT HEART CATH AND CORONARY ANGIOGRAPHY;  Surgeon: Yvonne Kendall, MD;  Location: [facility] INVASIVE CV LAB;  Service: Cardiovascular;  Laterality: N/A;   Patient Active Problem List   Diagnosis Date Noted   Anemia    Leukocytosis    Right arm weakness 01/25/2022   Right sided weakness    Fall at home 01/22/2022   Status post biventricular pacemaker - MRI compatible PPM and leads 01/22/2022   Chronic indwelling Foley catheter 01/22/2022   CKD (chronic kidney disease), stage III - IV (HCC) 06/28/2021   Chronic HFrEF (heart failure with reduced ejection fraction) (HCC) 03/31/2021   Arrhythmia    DM (diabetes mellitus), type 2 (HCC) new 10/26/2020   Urinary retention 10/26/2020   Hyperlipidemia LDL goal <70 10/26/2020   Coronary artery disease of native artery of native heart with stable angina pectoris (HCC) 10/26/2020   S/P angioplasty with stent 10/04/20 DES to LCX  10/26/2020   Ischemic cardiomyopathy 10/26/2020   Diabetes mellitus, type 2 (HCC) 10/06/2020   DOE (dyspnea on exertion)    Meningioma (HCC) 08/14/2020   Vestibular schwannoma (HCC) 08/14/2020   Cardiac arrest with ventricular fibrillation (HCC)    Pain of left hip joint 05/29/2020   Benign  brain tumor (HCC)    Chronic kidney disease    History of pulmonary embolus (PE)    Hypertension    Nonsustained ventricular tachycardia (HCC) 10/11/2019   Dizziness 10/11/2019   Abnormal echocardiogram 06/07/2019   Eustachian tube dysfunction 08/24/2018   Conductive hearing loss of both ears 08/24/2018   Pulmonary hypertension due to thromboembolism (HCC) 03/09/2018   Solitary pulmonary nodule on lung CT 03/08/2018   Ascending aortic aneurysm (HCC) 4.2 cm based on CT from December 2020 01/29/2018   Sinus bradycardia 12/22/2017   Left bundle branch block 12/19/2017   Personal history of pulmonary embolism - no longer on anticoagulation due to pt refusal to take it anymore. 12/19/2017   Chronic kidney disease, stage III (moderate) (HCC) 12/19/2017   Benign neoplasm of brain (HCC) 12/19/2017    PCP:  Gweneth Dimitri, MD  REFERRING PROVIDER: Gweneth Dimitri, MD  REFERRING DIAG: Free Text Diagnosis R29.6, R26.89, R26.9 Mult. Falls, Poor Balance, Gait Disturbance  THERAPY DIAG:  Unsteadiness on feet  Other lack of coordination  Muscle weakness (generalized)  Difficulty in walking, not elsewhere classified  Rationale for Evaluation and Treatment: Rehabilitation  ONSET DATE: chronic   SUBJECTIVE:   SUBJECTIVE STATEMENT:    Did my exercises over the weekend, and my other ones as well, got some walking in. Balance is still the worst past especially when standing up. Have more balance issues if its dark, if I can't see I lose my balance completely.      EVAL: Having issues with balance and walking. I do have a meningioma in my spine, and every once in awhile I have pain if I twist or stress it. Still having pain in my neck and hip from a fall that I had about a year and a half ago. Thought that pain would go away but it hasn't even this far out/being this healed. I have a lot of unsteadiness, not sure I'd call it dizziness but I have to stand for a minute before I get going. Have  a couple of walking sticks/canes but not sure how to use them honestly. Entire R LE and UE are  still numb. When I fell I was at a friend's house and were leaving, about an hour before that I had a glass of wine, I went out first and held the door for the next people but didn't realize that the concrete platform ended and dropped about 15 inches and ended up stepping off that 15 inch ledge and hit my head/shoulder/hip/knee on the wall beyond that. Meningioma is still just being monitored, saw the MD and they said its not growing but its just there in the spine. Every single movement I make with my neck hurts when I get to the end of that range.   PERTINENT HISTORY: See above- complex medical hx  PAIN:  Are you having pain? No 0/10   PRECAUTIONS: Fall and Other: caution with movements, some movements can cause increased pressure/pain at meningioma site, ICD    RED FLAGS: None   WEIGHT BEARING RESTRICTIONS: No  FALLS:  Has patient fallen in last 6 months? Yes. Number of falls 1- had a mis-step coming off a ladder, fell backwards and hit the floor pretty hard  LIVING ENVIRONMENT: Lives with: lives with their spouse Lives in: House/apartment Stairs:  5 STE with B rails, has second floor in his home but holds rail    OCCUPATION: retired   PLOF: Independent, Independent with basic ADLs, Independent with gait, and Independent with transfers  PATIENT GOALS: getting more active, improving balance   NEXT MD VISIT: Referring scheduled but unsure when   OBJECTIVE:  Note: Objective measures were completed at Evaluation unless otherwise noted.    PATIENT SURVEYS:  ABC scale 65.6%  COGNITION: Overall cognitive status: Within functional limits for tasks assessed     SENSATION: Complete R UE and R LE numbness (chronic)    COORDINATION  Noted some ataxia in R LE with functional movements   LOWER EXTREMITY MMT:  MMT Right eval Left eval  Hip flexion 4+ 5  Hip extension    Hip  abduction 5 5  Hip adduction    Hip internal rotation    Hip external rotation    Knee flexion 5 5  Knee extension 5 5  Ankle dorsiflexion 5 5  Ankle plantarflexion    Ankle inversion    Ankle eversion     (Blank rows = not tested)   FUNCTIONAL TESTS:  5 times sit to stand: 15.4 seconds use of UEs, some difficulty stabilizing Timed up and go (TUG): 14.2 seconds no device but did need MinA for balance when initially standing and then turning to sit in the chair at end of test  3 minute walk test: 612ft no device but needed MinA at times for balance on corners, RPE 4/10  GAIT: Distance walked: 642ft Assistive device utilized: None Level of assistance: Min A Comments: MinA for balance with turns and when navigating obstacles, some ataxic movements noted                                                                                                                                 TREATMENT DATE:   11/20/23  Nustep L5x8 minutes all four extremities for w/u, neural priming   Romberg stance 3x30 seconds  Side steps in // bars x4 laps no UEs One foot on 4 inch step/one foot on ground 3x30 seconds B Wide tandem stance with head turns x6 rounds  Romberg + head nods x3 rounds Cross midline reaching to numbers on wall solid surface  1/4 turns for full circle- 2 rounds left and 2 rounds right      11/16/23  Orthostatic BP: Supine 132/87 HR 57 Sitting 119/88 HR 64 Standing 134/86 HR 71 Standing x3 minutes 149/90 HR 69 Nustep L5x8 minutes all four extremities for functional activity tolerance   Wide tandem stance 3x30 seconds  Romberg stance 3x30 seconds    10/26/23  Eval, care planning     PATIENT EDUCATION:  Education details: exam findings, POC Person educated: Patient Education method: Explanation, Demonstration, and Verbal cues Education comprehension: verbalized understanding, returned demonstration, and needs further education  HOME EXERCISE  PROGRAM:  Access Code: [email or web address] URL: [email or web address] Date: 11/20/2023 Prepared by: Nedra Hai  Exercises - Wide Tandem Stance with Eyes Open  - 1 x daily - 7 x weekly - 2 sets - 3 reps - 30 seconds  hold - Romberg Stance  - 1 x daily - 7 x weekly - 2 sets - 3 reps - 30 second  hold - Single Leg Stance with Support  - 1 x daily - 7 x weekly - 2 sets - 3 reps    ASSESSMENT:  CLINICAL IMPRESSION:   Pt arrives today doing OK, biggest concern is still balance work. Has been more active since getting back from his trip. We focused on balance training today to address his concerns in this area, did get a bit unsteady when standing to take off his jacket when PT came to get him from waiting room. Needed up to ModA to maintain balance during challenges today.     EVAL: Patient is a 78 y.o. M who was seen today for physical therapy evaluation and treatment for  Free Text Diagnosis R29.6, R26.89, R26.9 Mult. Falls, Poor Balance, Gait Disturbance. Very pleasant and cooperative but very talkative during eval which did limit assessment time. Still having a lot of pain in general since his fall  and ongoing numbness in R UE/LE. Gait mechanics and balance are definitely impaired, would benefit from training with appropriate AD if he is open to this. Will benefit from skilled PT services to address his concerns and functional objective impairments to the best of our ability.   OBJECTIVE IMPAIRMENTS: Abnormal gait, cardiopulmonary status limiting activity, decreased activity tolerance, decreased balance, decreased mobility, and difficulty walking.   ACTIVITY LIMITATIONS: standing, squatting, stairs, transfers, reach over head, and locomotion level  PARTICIPATION LIMITATIONS: driving, shopping, community activity, and occupation  PERSONAL FACTORS: Age, Behavior pattern, Fitness, Past/current experiences, Social background, and Time since onset of injury/illness/exacerbation are also  affecting patient's functional outcome.   REHAB POTENTIAL: Good  CLINICAL DECISION MAKING: Evolving/moderate complexity  EVALUATION COMPLEXITY: Moderate   GOALS: Goals reviewed with patient? No  SHORT TERM GOALS: Target date: 11/23/2023   Will be compliant with appropriate progressive HEP  Baseline: Goal status: INITIAL  2.  Will be able to name 3 ways to prevent falls at home and in the community  Baseline:  Goal status: INITIAL  3.  Will be able to perform 5xSTS and with distant S Baseline: MinA  Goal status: INITIAL  4.  Will complete TUG test in 12 seconds or less S  Baseline: MinA  Goal status: INITIAL    LONG TERM GOALS: Target date: 12/21/2023    Will score at least 18 on DGI to show improved functional balance  Baseline:  Goal status: INITIAL  2.  Will be able to ambulate community distances (level and inclines) no device, RPE no more than 2/10  Baseline:  Goal status: INITIAL  3.  Will be able to perform all desired gym activities without increased pain from resting levels  Baseline:  Goal status: INITIAL  4.  ABC score to improve by at least 20 points to show improved subjective status  Baseline:  Goal status: INITIAL     PLAN:  PT FREQUENCY: 2x/week  PT DURATION: 8 weeks  PLANNED INTERVENTIONS: 97164- PT Re-evaluation, 97110-Therapeutic exercises, 97530- Therapeutic activity, [id number]- Neuromuscular re-education, 97535- Self Care, [id number]- Manual therapy, [phone]- Gait training, [phone]- Aquatic Therapy, and Balance training  PLAN FOR NEXT SESSION: balance and coordination, functional activity tolerance  Nedra Hai, PT, DPT 11/20/23 12:27 PM

## 2023-11-21 DIAGNOSIS — N1832 Chronic kidney disease, stage 3b: Secondary | ICD-10-CM | POA: Diagnosis not present

## 2023-11-23 ENCOUNTER — Encounter: Payer: Self-pay | Admitting: Physical Therapy

## 2023-11-23 ENCOUNTER — Ambulatory Visit: Payer: Medicare Other | Admitting: Physical Therapy

## 2023-11-23 DIAGNOSIS — R531 Weakness: Secondary | ICD-10-CM | POA: Diagnosis not present

## 2023-11-23 DIAGNOSIS — R278 Other lack of coordination: Secondary | ICD-10-CM | POA: Diagnosis not present

## 2023-11-23 DIAGNOSIS — R2681 Unsteadiness on feet: Secondary | ICD-10-CM | POA: Diagnosis not present

## 2023-11-23 DIAGNOSIS — M6281 Muscle weakness (generalized): Secondary | ICD-10-CM | POA: Diagnosis not present

## 2023-11-23 DIAGNOSIS — R262 Difficulty in walking, not elsewhere classified: Secondary | ICD-10-CM | POA: Diagnosis not present

## 2023-11-23 DIAGNOSIS — R296 Repeated falls: Secondary | ICD-10-CM | POA: Diagnosis not present

## 2023-11-23 NOTE — Therapy (Signed)
 OUTPATIENT PHYSICAL THERAPY LOWER EXTREMITY TREATMENT    Patient Name: Timothy Moran MRN: [id number] DOB:1946/08/02, 78 y.o., male Today's Date: 11/23/2023  END OF SESSION:  PT End of Session - 11/23/23 1145     Visit Number 4    Number of Visits 17    Date for PT Re-Evaluation 12/21/23    Authorization Type MCR    Progress Note Due on Visit 10    PT Start Time 1146    PT Stop Time 1226    PT Time Calculation (min) 40 min    Activity Tolerance Patient tolerated treatment well    Behavior During Therapy [facility] for tasks assessed/performed                Past Medical History:  Diagnosis Date   Acute blood loss anemia 10/05/2020   Acute respiratory failure (HCC), hypothermia therapy, vent - extubated 10/06/20    AKI (acute kidney injury) (HCC) 10/05/2020   Anoxic brain injury (HCC) 10/26/2020   Arrhythmia    Ascending aortic aneurysm (HCC) 01/29/2018   a. 2019 4.3 cm by echo; b. 02/2021 Echo: Ao root 40mm.   Benign brain tumor (HCC)    Benign neoplasm of brain (HCC) 12/19/2017   Cardiac arrest with ventricular fibrillation (HCC)    Chest pain 12/04/2017   Chronic kidney disease, stage III (moderate) (HCC) 12/19/2017   CKD (chronic kidney disease), stage III - IV (HCC)    Conductive hearing loss of both ears 08/24/2018   Coronary Artery Disease    a. 09/2020 Inf STEMI complicated by cardiac arrest/PCI: LAD 50p/m, LCX 100d (2.5 x 30 Resolute Onyx DES); b. 04/2021 PCI: 04/2021 LM nl, LAD 50p/37m (2.5x30 Onyx Frontier DES), LCX 25p, 40m (RFR 0.98), 40d ISR (RFR 0.98), OM2 25, RCA mild diff dzs.   Diabetes mellitus, type 2 (HCC) 10/06/2020   10/06/20 A1C 7%   Dizziness 10/11/2019   Encounter for imaging study to confirm orogastric (OG) tube placement    Eustachian tube dysfunction 08/24/2018   Hematuria 10/26/2020   HFrEF (heart failure with reduced ejection fraction) (HCC)    a. 11/2020 Echo: EF 25-30%; b. 02/2021 Echo: EF 25-30%, glob HK. Mild LVH. Nl RV fxn. Triv AI. Ao  root 40mm.   History of pulmonary embolus (PE)    HLD (hyperlipidemia) 10/26/2020   Hypertension    Iron deficiency anemia rec'd IV iron 10/26/2020   Ischemic cardiomyopathy    a. 11/2020 Echo: EF 25-30%; b. 02/2021 Echo: EF 25-30%, glob HK.   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 Marland KitchenSpirometry 03/08/2018    FEV1 3.86 (108%)  Ratio 96 s prior rx  - PET  03/13/18   Low grade c/w adenoca > rec T surgery eval     STEMI (ST elevation myocardial infarction) (HCC) 10/04/2020   Urinary retention  10/26/2020   Vestibular schwannoma (HCC) 08/14/2020   Past Surgical History:  Procedure Laterality Date   APPENDECTOMY     BIV ICD INSERTION CRT-D N/A 11/03/2021   Procedure: BIV ICD INSERTION CRT-D;  Surgeon: Regan Lemming, MD;  Location: [facility] INVASIVE CV LAB;  Service: Cardiovascular;  Laterality: N/A;   CARDIAC CATHETERIZATION     CORONARY ANGIOPLASTY WITH STENT PLACEMENT Left 04/12/2021   LAD stent placement   CORONARY PRESSURE/FFR STUDY N/A 04/12/2021   Procedure: INTRAVASCULAR PRESSURE WIRE/FFR STUDY;  Surgeon: Yvonne Kendall, MD;  Location: [facility] INVASIVE CV LAB;  Service: Cardiovascular;  Laterality: N/A;   CORONARY STENT INTERVENTION N/A 04/12/2021   Procedure: CORONARY STENT INTERVENTION;  Surgeon: Yvonne Kendall, MD;  Location: [facility] INVASIVE CV LAB;  Service:  Cardiovascular;  Laterality: N/A;   CORONARY ULTRASOUND/IVUS N/A 04/12/2021   Procedure: Intravascular Ultrasound/IVUS;  Surgeon: Yvonne Kendall, MD;  Location: [facility] INVASIVE CV LAB;  Service: Cardiovascular;  Laterality: N/A;   CORONARY/GRAFT ACUTE MI REVASCULARIZATION N/A 10/04/2020   Procedure: Coronary/Graft Acute MI Revascularization;  Surgeon: Yvonne Kendall, MD;  Location: [facility] INVASIVE CV LAB;  Service: Cardiovascular;  Laterality: N/A;   HERNIA REPAIR     LEFT HEART CATH AND CORONARY ANGIOGRAPHY N/A 10/04/2020   Procedure: LEFT HEART CATH AND CORONARY ANGIOGRAPHY;  Surgeon: Yvonne Kendall, MD;  Location: [facility] INVASIVE CV LAB;  Service: Cardiovascular;  Laterality: N/A;   RIGHT/LEFT HEART CATH AND CORONARY ANGIOGRAPHY N/A 04/12/2021   Procedure: RIGHT/LEFT HEART CATH AND CORONARY ANGIOGRAPHY;  Surgeon: Yvonne Kendall, MD;  Location: [facility] INVASIVE CV LAB;  Service: Cardiovascular;  Laterality: N/A;   Patient Active Problem List   Diagnosis Date Noted   Anemia    Leukocytosis    Right arm weakness 01/25/2022   Right sided weakness    Fall at home 01/22/2022   Status post biventricular pacemaker - MRI compatible PPM and leads 01/22/2022   Chronic indwelling Foley catheter 01/22/2022   CKD (chronic kidney disease), stage III - IV (HCC) 06/28/2021   Chronic HFrEF (heart failure with reduced ejection fraction) (HCC) 03/31/2021   Arrhythmia    DM (diabetes mellitus), type 2 (HCC) new 10/26/2020   Urinary retention 10/26/2020   Hyperlipidemia LDL goal <70 10/26/2020   Coronary artery disease of native artery of native heart with stable angina pectoris (HCC) 10/26/2020   S/P angioplasty with stent 10/04/20 DES to LCX  10/26/2020   Ischemic cardiomyopathy 10/26/2020   Diabetes mellitus, type 2 (HCC) 10/06/2020   DOE (dyspnea on exertion)    Meningioma (HCC) 08/14/2020   Vestibular schwannoma (HCC) 08/14/2020   Cardiac arrest with ventricular fibrillation (HCC)    Pain of left hip joint  05/29/2020   Benign brain tumor (HCC)    Chronic kidney disease    History of pulmonary embolus (PE)    Hypertension    Nonsustained ventricular tachycardia (HCC) 10/11/2019   Dizziness 10/11/2019   Abnormal echocardiogram 06/07/2019   Eustachian tube dysfunction 08/24/2018   Conductive hearing loss of both ears 08/24/2018   Pulmonary hypertension due to thromboembolism (HCC) 03/09/2018   Solitary pulmonary nodule on lung CT 03/08/2018   Ascending aortic aneurysm (HCC) 4.2 cm based on CT from December 2020 01/29/2018   Sinus bradycardia 12/22/2017   Left bundle branch block 12/19/2017   Personal history of pulmonary embolism - no longer on anticoagulation due to pt refusal to take it anymore. 12/19/2017   Chronic kidney disease, stage III (moderate) (HCC) 12/19/2017   Benign neoplasm of brain (HCC) 12/19/2017  PCP: Gweneth Dimitri, MD  REFERRING PROVIDER: Gweneth Dimitri, MD  REFERRING DIAG: Free Text Diagnosis R29.6, R26.89, R26.9 Mult. Falls, Poor Balance, Gait Disturbance  THERAPY DIAG:  Unsteadiness on feet  Other lack of coordination  Rationale for Evaluation and Treatment: Rehabilitation  ONSET DATE: chronic   SUBJECTIVE:   SUBJECTIVE STATEMENT:  Things are OK, I can feel the imbalance when I do the exercises at home.     EVAL: Having issues with balance and walking. I do have a meningioma in my spine, and every once in awhile I have pain if I twist or stress it. Still having pain in my neck and hip from a fall that I had about a year and a half ago. Thought that pain would go away but it hasn't even this far out/being this healed. I have a lot of unsteadiness, not sure I'd call it dizziness but I have to stand for a minute before I get going. Have a couple of walking sticks/canes but not sure how to use them honestly. Entire R LE and UE are  still numb. When I fell I was at a friend's house and were leaving, about an hour before that I had a glass of wine, I went out  first and held the door for the next people but didn't realize that the concrete platform ended and dropped about 15 inches and ended up stepping off that 15 inch ledge and hit my head/shoulder/hip/knee on the wall beyond that. Meningioma is still just being monitored, saw the MD and they said its not growing but its just there in the spine. Every single movement I make with my neck hurts when I get to the end of that range.   PERTINENT HISTORY: See above- complex medical hx  PAIN:  Are you having pain? No 0/10   PRECAUTIONS: Fall and Other: caution with movements, some movements can cause increased pressure/pain at meningioma site, ICD    RED FLAGS: None   WEIGHT BEARING RESTRICTIONS: No  FALLS:  Has patient fallen in last 6 months? Yes. Number of falls 1- had a mis-step coming off a ladder, fell backwards and hit the floor pretty hard  LIVING ENVIRONMENT: Lives with: lives with their spouse Lives in: House/apartment Stairs:  5 STE with B rails, has second floor in his home but holds rail    OCCUPATION: retired   PLOF: Independent, Independent with basic ADLs, Independent with gait, and Independent with transfers  PATIENT GOALS: getting more active, improving balance   NEXT MD VISIT: Referring scheduled but unsure when   OBJECTIVE:  Note: Objective measures were completed at Evaluation unless otherwise noted.    PATIENT SURVEYS:  ABC scale 65.6%  COGNITION: Overall cognitive status: Within functional limits for tasks assessed     SENSATION: Complete R UE and R LE numbness (chronic)    COORDINATION  Noted some ataxia in R LE with functional movements   LOWER EXTREMITY MMT:  MMT Right eval Left eval  Hip flexion 4+ 5  Hip extension    Hip abduction 5 5  Hip adduction    Hip internal rotation    Hip external rotation    Knee flexion 5 5  Knee extension 5 5  Ankle dorsiflexion 5 5  Ankle plantarflexion    Ankle inversion    Ankle eversion     (Blank  rows = not tested)   FUNCTIONAL TESTS:  5 times sit to stand: 15.4 seconds use of UEs, some difficulty stabilizing; 11/20/23  15.2 seconds use of UEs  Timed up and go (TUG): 14.2 seconds no device but did need MinA for balance when initially standing and then turning to sit in the chair at end of test; 11/20/23 10 seconds  3 minute walk test: 682ft no device but needed MinA at times for balance on corners, RPE 4/10  GAIT: Distance walked: 611ft Assistive device utilized: None Level of assistance: Min A Comments: MinA for balance with turns and when navigating obstacles, some ataxic movements noted                                                                                                                                 TREATMENT DATE:    11/23/23  Narrow BOS solid surface 3x30 seconds  Forward step overs half foam roll up to ModA for balance, difficulty with R LE motor control when stepping with L LE Lateral step overs half foam roll up to MinA for balance, not as challenging as forward steps Alternating toe taps to air targets x20 min guard  Standing with one LE on small air pad and other on solid surface 3x30 seconds B up to MinA for balance  Cross midline target taps solid surface x10 B up to ModA for balance  Nustep L6x5 minutes all four extremities for promotion of reciprocal motion TUG and 5xSTS checks for STG update      11/20/23  Nustep L5x8 minutes all four extremities for w/u, neural priming   Romberg stance 3x30 seconds  Side steps in // bars x4 laps no UEs One foot on 4 inch step/one foot on ground 3x30 seconds B Wide tandem stance with head turns x6 rounds  Romberg + head nods x3 rounds Cross midline reaching to numbers on wall solid surface  1/4 turns for full circle- 2 rounds left and 2 rounds right       PATIENT EDUCATION:  Education details: exam findings, POC Person educated: Patient Education method: Explanation, Demonstration, and Verbal  cues Education comprehension: verbalized understanding, returned demonstration, and needs further education  HOME EXERCISE PROGRAM:  Access Code: [email or web address] URL: [email or web address] Date: 11/20/2023 Prepared by: Nedra Hai  Exercises - Wide Tandem Stance with Eyes Open  - 1 x daily - 7 x weekly - 2 sets - 3 reps - 30 seconds  hold - Romberg Stance  - 1 x daily - 7 x weekly - 2 sets - 3 reps - 30 second  hold - Single Leg Stance with Support  - 1 x daily - 7 x weekly - 2 sets - 3 reps    ASSESSMENT:  CLINICAL IMPRESSION:  Pt arrives today doing OK, still having pretty significant issues with balance today, requires close guarding for safety during session. Educated a bit today on anatomy of brain and spinal cord today and how location of meningiomas may be affecting balance/motor control. Continued to progress balance training today, I am noticing some gradual improvements in  general steadiness with transfers and I'm happy to see he is making decent progress towards his goals already- will continue to increase challenges as able.      EVAL: Patient is a 78 y.o. M who was seen today for physical therapy evaluation and treatment for Free Text Diagnosis R29.6, R26.89, R26.9 Mult. Falls, Poor Balance, Gait Disturbance. Very pleasant and cooperative but very talkative during eval which did limit assessment time. Still having a lot of pain in general since his fall  and ongoing numbness in R UE/LE. Gait mechanics and balance are definitely impaired, would benefit from training with appropriate AD if he is open to this. Will benefit from skilled PT services to address his concerns and functional objective impairments to the best of our ability.   OBJECTIVE IMPAIRMENTS: Abnormal gait, cardiopulmonary status limiting activity, decreased activity tolerance, decreased balance, decreased mobility, and difficulty walking.   ACTIVITY LIMITATIONS: standing, squatting, stairs,  transfers, reach over head, and locomotion level  PARTICIPATION LIMITATIONS: driving, shopping, community activity, and occupation  PERSONAL FACTORS: Age, Behavior pattern, Fitness, Past/current experiences, Social background, and Time since onset of injury/illness/exacerbation are also affecting patient's functional outcome.   REHAB POTENTIAL: Good  CLINICAL DECISION MAKING: Evolving/moderate complexity  EVALUATION COMPLEXITY: Moderate   GOALS: Goals reviewed with patient? No  SHORT TERM GOALS: Target date: 11/23/2023   Will be compliant with appropriate progressive HEP  Baseline: Goal status: MET 11/23/23  2.  Will be able to name 3 ways to prevent falls at home and in the community  Baseline:  Goal status: MET 11/23/23  3.  Will be able to perform 5xSTS and with distant S Baseline: MinA  Goal status: ONGOING 11/23/23  4.  Will complete TUG test in 12 seconds or less S  Baseline: MinA  Goal status: MET 11/20/23    LONG TERM GOALS: Target date: 12/21/2023    Will score at least 18 on DGI to show improved functional balance  Baseline:  Goal status: INITIAL  2.  Will be able to ambulate community distances (level and inclines) no device, RPE no more than 2/10  Baseline:  Goal status: INITIAL  3.  Will be able to perform all desired gym activities without increased pain from resting levels  Baseline:  Goal status: INITIAL  4.  ABC score to improve by at least 20 points to show improved subjective status  Baseline:  Goal status: INITIAL     PLAN:  PT FREQUENCY: 2x/week  PT DURATION: 8 weeks  PLANNED INTERVENTIONS: [id number]- PT Re-evaluation, 97110-Therapeutic exercises, 97530- Therapeutic activity, 97112- Neuromuscular re-education, 97535- Self Care, [id number]- Manual therapy, [phone]- Gait training, [phone]- Aquatic Therapy, and Balance training  PLAN FOR NEXT SESSION: balance and coordination, functional activity tolerance, work on some quad strength and  endurance   Nedra Hai, PT, DPT 11/23/23 12:30 PM

## 2023-11-27 ENCOUNTER — Encounter: Payer: Self-pay | Admitting: Physical Therapy

## 2023-11-27 ENCOUNTER — Ambulatory Visit: Payer: Medicare Other | Admitting: Physical Therapy

## 2023-11-27 DIAGNOSIS — R531 Weakness: Secondary | ICD-10-CM | POA: Diagnosis not present

## 2023-11-27 DIAGNOSIS — N1832 Chronic kidney disease, stage 3b: Secondary | ICD-10-CM | POA: Diagnosis not present

## 2023-11-27 DIAGNOSIS — R2681 Unsteadiness on feet: Secondary | ICD-10-CM

## 2023-11-27 DIAGNOSIS — M6281 Muscle weakness (generalized): Secondary | ICD-10-CM

## 2023-11-27 DIAGNOSIS — I129 Hypertensive chronic kidney disease with stage 1 through stage 4 chronic kidney disease, or unspecified chronic kidney disease: Secondary | ICD-10-CM | POA: Diagnosis not present

## 2023-11-27 DIAGNOSIS — D631 Anemia in chronic kidney disease: Secondary | ICD-10-CM | POA: Diagnosis not present

## 2023-11-27 DIAGNOSIS — R262 Difficulty in walking, not elsewhere classified: Secondary | ICD-10-CM | POA: Diagnosis not present

## 2023-11-27 DIAGNOSIS — I5022 Chronic systolic (congestive) heart failure: Secondary | ICD-10-CM | POA: Diagnosis not present

## 2023-11-27 DIAGNOSIS — R296 Repeated falls: Secondary | ICD-10-CM | POA: Diagnosis not present

## 2023-11-27 DIAGNOSIS — N189 Chronic kidney disease, unspecified: Secondary | ICD-10-CM | POA: Diagnosis not present

## 2023-11-27 DIAGNOSIS — R278 Other lack of coordination: Secondary | ICD-10-CM

## 2023-11-27 DIAGNOSIS — E872 Acidosis, unspecified: Secondary | ICD-10-CM | POA: Diagnosis not present

## 2023-11-27 DIAGNOSIS — N179 Acute kidney failure, unspecified: Secondary | ICD-10-CM | POA: Diagnosis not present

## 2023-11-27 NOTE — Therapy (Signed)
 OUTPATIENT PHYSICAL THERAPY LOWER EXTREMITY TREATMENT    Patient Name: Timothy Moran MRN: [id number] DOB:05/05/1946, 78 y.o., male Today's Date: 11/27/2023  END OF SESSION:  PT End of Session - 11/27/23 1157     Visit Number 5    Number of Visits 17    Date for PT Re-Evaluation 12/21/23    Authorization Type MCR    Progress Note Due on Visit 10    PT Start Time 1146    PT Stop Time 1219   pt had to leave early   PT Time Calculation (min) 33 min    Activity Tolerance Patient tolerated treatment well    Behavior During Therapy [facility] for tasks assessed/performed                 Past Medical History:  Diagnosis Date   Acute blood loss anemia 10/05/2020   Acute respiratory failure (HCC), hypothermia therapy, vent - extubated 10/06/20    AKI (acute kidney injury) (HCC) 10/05/2020   Anoxic brain injury (HCC) 10/26/2020   Arrhythmia    Ascending aortic aneurysm (HCC) 01/29/2018   a. 2019 4.3 cm by echo; b. 02/2021 Echo: Ao root 40mm.   Benign brain tumor (HCC)    Benign neoplasm of brain (HCC) 12/19/2017   Cardiac arrest with ventricular fibrillation (HCC)    Chest pain 12/04/2017   Chronic kidney disease, stage III (moderate) (HCC) 12/19/2017   CKD (chronic kidney disease), stage III - IV (HCC)    Conductive hearing loss of both ears 08/24/2018   Coronary Artery Disease    a. 09/2020 Inf STEMI complicated by cardiac arrest/PCI: LAD 50p/m, LCX 100d (2.5 x 30 Resolute Onyx DES); b. 04/2021 PCI: 04/2021 LM nl, LAD 50p/59m (2.5x30 Onyx Frontier DES), LCX 25p, 60m (RFR 0.98), 40d ISR (RFR 0.98), OM2 25, RCA mild diff dzs.   Diabetes mellitus, type 2 (HCC) 10/06/2020   10/06/20 A1C 7%   Dizziness 10/11/2019   Encounter for imaging study to confirm orogastric (OG) tube placement    Eustachian tube dysfunction 08/24/2018   Hematuria 10/26/2020   HFrEF (heart failure with reduced ejection fraction) (HCC)    a. 11/2020 Echo: EF 25-30%; b. 02/2021 Echo: EF 25-30%, glob HK. Mild LVH.  Nl RV fxn. Triv AI. Ao root 40mm.   History of pulmonary embolus (PE)    HLD (hyperlipidemia) 10/26/2020   Hypertension    Iron deficiency anemia rec'd IV iron 10/26/2020   Ischemic cardiomyopathy    a. 11/2020 Echo: EF 25-30%; b. 02/2021 Echo: EF 25-30%, glob HK.   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 Marland KitchenSpirometry 03/08/2018    FEV1 3.86 (108%)  Ratio 96 s prior rx  - PET  03/13/18   Low grade c/w adenoca > rec T surgery eval     STEMI (ST elevation myocardial  infarction) (HCC) 10/04/2020   Urinary retention 10/26/2020   Vestibular schwannoma (HCC) 08/14/2020   Past Surgical History:  Procedure Laterality Date   APPENDECTOMY     BIV ICD INSERTION CRT-D N/A 11/03/2021   Procedure: BIV ICD INSERTION CRT-D;  Surgeon: Regan Lemming, MD;  Location: [facility] INVASIVE CV LAB;  Service: Cardiovascular;  Laterality: N/A;   CARDIAC CATHETERIZATION     CORONARY ANGIOPLASTY WITH STENT PLACEMENT Left 04/12/2021   LAD stent placement   CORONARY PRESSURE/FFR STUDY N/A 04/12/2021   Procedure: INTRAVASCULAR PRESSURE WIRE/FFR STUDY;  Surgeon: Yvonne Kendall, MD;  Location: [facility] INVASIVE CV LAB;  Service: Cardiovascular;  Laterality: N/A;   CORONARY STENT INTERVENTION N/A 04/12/2021   Procedure: CORONARY STENT INTERVENTION;  Surgeon: Yvonne Kendall, MD;  Location: [facility] INVASIVE CV LAB;   Service: Cardiovascular;  Laterality: N/A;   CORONARY ULTRASOUND/IVUS N/A 04/12/2021   Procedure: Intravascular Ultrasound/IVUS;  Surgeon: Yvonne Kendall, MD;  Location: [facility] INVASIVE CV LAB;  Service: Cardiovascular;  Laterality: N/A;   CORONARY/GRAFT ACUTE MI REVASCULARIZATION N/A 10/04/2020   Procedure: Coronary/Graft Acute MI Revascularization;  Surgeon: Yvonne Kendall, MD;  Location: [facility] INVASIVE CV LAB;  Service: Cardiovascular;  Laterality: N/A;   HERNIA REPAIR     LEFT HEART CATH AND CORONARY ANGIOGRAPHY N/A 10/04/2020   Procedure: LEFT HEART CATH AND CORONARY ANGIOGRAPHY;  Surgeon: Yvonne Kendall, MD;  Location: [facility] INVASIVE CV LAB;  Service: Cardiovascular;  Laterality: N/A;   RIGHT/LEFT HEART CATH AND CORONARY ANGIOGRAPHY N/A 04/12/2021   Procedure: RIGHT/LEFT HEART CATH AND CORONARY ANGIOGRAPHY;  Surgeon: Yvonne Kendall, MD;  Location: [facility] INVASIVE CV LAB;  Service: Cardiovascular;  Laterality: N/A;   Patient Active Problem List   Diagnosis Date Noted   Anemia    Leukocytosis    Right arm weakness 01/25/2022   Right sided weakness    Fall at home 01/22/2022   Status post biventricular pacemaker - MRI compatible PPM and leads 01/22/2022   Chronic indwelling Foley catheter 01/22/2022   CKD (chronic kidney disease), stage III - IV (HCC) 06/28/2021   Chronic HFrEF (heart failure with reduced ejection fraction) (HCC) 03/31/2021   Arrhythmia    DM (diabetes mellitus), type 2 (HCC) new 10/26/2020   Urinary retention 10/26/2020   Hyperlipidemia LDL goal <70 10/26/2020   Coronary artery disease of native artery of native heart with stable angina pectoris (HCC) 10/26/2020   S/P angioplasty with stent 10/04/20 DES to LCX  10/26/2020   Ischemic cardiomyopathy 10/26/2020   Diabetes mellitus, type 2 (HCC) 10/06/2020   DOE (dyspnea on exertion)    Meningioma (HCC) 08/14/2020   Vestibular schwannoma (HCC) 08/14/2020   Cardiac arrest with ventricular fibrillation (HCC)    Pain of left hip  joint 05/29/2020   Benign brain tumor (HCC)    Chronic kidney disease    History of pulmonary embolus (PE)    Hypertension    Nonsustained ventricular tachycardia (HCC) 10/11/2019   Dizziness 10/11/2019   Abnormal echocardiogram 06/07/2019   Eustachian tube dysfunction 08/24/2018   Conductive hearing loss of both ears 08/24/2018   Pulmonary hypertension due to thromboembolism (HCC) 03/09/2018   Solitary pulmonary nodule on lung CT 03/08/2018   Ascending aortic aneurysm (HCC) 4.2 cm based on CT from December 2020 01/29/2018   Sinus bradycardia 12/22/2017   Left bundle branch block 12/19/2017   Personal history of pulmonary embolism - no longer on anticoagulation due to pt refusal to take it anymore. 12/19/2017   Chronic kidney disease, stage III (moderate) (HCC) 12/19/2017  Benign neoplasm of brain (HCC) 12/19/2017    PCP: Gweneth Dimitri, MD  REFERRING PROVIDER: Gweneth Dimitri, MD  REFERRING DIAG: Free Text Diagnosis R29.6, R26.89, R26.9 Mult. Falls, Poor Balance, Gait Disturbance  THERAPY DIAG:  Unsteadiness on feet  Other lack of coordination  Muscle weakness (generalized)  Rationale for Evaluation and Treatment: Rehabilitation  ONSET DATE: chronic   SUBJECTIVE:   SUBJECTIVE STATEMENT:  Had an uneventful weekend, nothing really new. Need to leave a little early today due to kidney MD appt    EVAL: Having issues with balance and walking. I do have a meningioma in my spine, and every once in awhile I have pain if I twist or stress it. Still having pain in my neck and hip from a fall that I had about a year and a half ago. Thought that pain would go away but it hasn't even this far out/being this healed. I have a lot of unsteadiness, not sure I'd call it dizziness but I have to stand for a minute before I get going. Have a couple of walking sticks/canes but not sure how to use them honestly. Entire R LE and UE are  still numb. When I fell I was at a friend's house and were  leaving, about an hour before that I had a glass of wine, I went out first and held the door for the next people but didn't realize that the concrete platform ended and dropped about 15 inches and ended up stepping off that 15 inch ledge and hit my head/shoulder/hip/knee on the wall beyond that. Meningioma is still just being monitored, saw the MD and they said its not growing but its just there in the spine. Every single movement I make with my neck hurts when I get to the end of that range.   PERTINENT HISTORY: See above- complex medical hx  PAIN:  Are you having pain? No 0/10 now   PRECAUTIONS: Fall and Other: caution with movements, some movements can cause increased pressure/pain at meningioma site, ICD    RED FLAGS: None   WEIGHT BEARING RESTRICTIONS: No  FALLS:  Has patient fallen in last 6 months? Yes. Number of falls 1- had a mis-step coming off a ladder, fell backwards and hit the floor pretty hard  LIVING ENVIRONMENT: Lives with: lives with their spouse Lives in: House/apartment Stairs:  5 STE with B rails, has second floor in his home but holds rail    OCCUPATION: retired   PLOF: Independent, Independent with basic ADLs, Independent with gait, and Independent with transfers  PATIENT GOALS: getting more active, improving balance   NEXT MD VISIT: Referring scheduled but unsure when   OBJECTIVE:  Note: Objective measures were completed at Evaluation unless otherwise noted.    PATIENT SURVEYS:  ABC scale 65.6%  COGNITION: Overall cognitive status: Within functional limits for tasks assessed     SENSATION: Complete R UE and R LE numbness (chronic)    COORDINATION  Noted some ataxia in R LE with functional movements   LOWER EXTREMITY MMT:  MMT Right eval Left eval  Hip flexion 4+ 5  Hip extension    Hip abduction 5 5  Hip adduction    Hip internal rotation    Hip external rotation    Knee flexion 5 5  Knee extension 5 5  Ankle dorsiflexion 5 5   Ankle plantarflexion    Ankle inversion    Ankle eversion     (Blank rows = not tested)  FUNCTIONAL TESTS:  5 times sit to stand: 15.4 seconds use of UEs, some difficulty stabilizing; 11/20/23 15.2 seconds use of UEs  Timed up and go (TUG): 14.2 seconds no device but did need MinA for balance when initially standing and then turning to sit in the chair at end of test; 11/20/23 10 seconds  3 minute walk test: 656ft no device but needed MinA at times for balance on corners, RPE 4/10  GAIT: Distance walked: 642ft Assistive device utilized: None Level of assistance: Min A Comments: MinA for balance with turns and when navigating obstacles, some ataxic movements noted                                                                                                                                 TREATMENT DATE:    11/27/23  Alternating forward toe taps to targets on 4 inch steps x20 Lateral 4 inch step taps x10 B  Double 4 inch step taps x10 B Cross midline double 4 inch step taps x5 B up to ModA  Side steps in // bars x5 rounds solid surface S 90* turns CW, then CCW, then in randomized directions up to ModA  Randomized perturbations on solid surface (tried blue foam pad but not ready for perturbations yet)   Nustep L6x5 + L5x5 minutes all four extremities for functional activity tolerance and strength       11/23/23  Narrow BOS solid surface 3x30 seconds  Forward step overs half foam roll up to ModA for balance, difficulty with R LE motor control when stepping with L LE Lateral step overs half foam roll up to MinA for balance, not as challenging as forward steps Alternating toe taps to air targets x20 min guard  Standing with one LE on small air pad and other on solid surface 3x30 seconds B up to MinA for balance  Cross midline target taps solid surface x10 B up to ModA for balance  Nustep L6x5 minutes all four extremities for promotion of reciprocal motion TUG and 5xSTS  checks for STG update         PATIENT EDUCATION:  Education details: exam findings, POC Person educated: Patient Education method: Explanation, Demonstration, and Verbal cues Education comprehension: verbalized understanding, returned demonstration, and needs further education  HOME EXERCISE PROGRAM:  Access Code: [email or web address] URL: [email or web address] Date: 11/20/2023 Prepared by: Nedra Hai  Exercises - Wide Tandem Stance with Eyes Open  - 1 x daily - 7 x weekly - 2 sets - 3 reps - 30 seconds  hold - Romberg Stance  - 1 x daily - 7 x weekly - 2 sets - 3 reps - 30 second  hold - Single Leg Stance with Support  - 1 x daily - 7 x weekly - 2 sets - 3 reps    ASSESSMENT:  CLINICAL IMPRESSION:  Pt arrives today doing OK, requested we end today a little early due to another appointment he  has early afternoon. Progressed all balance activities as appropriate and tolerated today, did really well with more challenging tasks. Encouraged him to make more appts all the way up to formal recert/reassess date- definitely making progress but I do anticipate that his POC may need to be extended to meet LTGs.      EVAL: Patient is a 78 y.o. M who was seen today for physical therapy evaluation and treatment for Free Text Diagnosis R29.6, R26.89, R26.9 Mult. Falls, Poor Balance, Gait Disturbance. Very pleasant and cooperative but very talkative during eval which did limit assessment time. Still having a lot of pain in general since his fall  and ongoing numbness in R UE/LE. Gait mechanics and balance are definitely impaired, would benefit from training with appropriate AD if he is open to this. Will benefit from skilled PT services to address his concerns and functional objective impairments to the best of our ability.   OBJECTIVE IMPAIRMENTS: Abnormal gait, cardiopulmonary status limiting activity, decreased activity tolerance, decreased balance, decreased mobility, and difficulty  walking.   ACTIVITY LIMITATIONS: standing, squatting, stairs, transfers, reach over head, and locomotion level  PARTICIPATION LIMITATIONS: driving, shopping, community activity, and occupation  PERSONAL FACTORS: Age, Behavior pattern, Fitness, Past/current experiences, Social background, and Time since onset of injury/illness/exacerbation are also affecting patient's functional outcome.   REHAB POTENTIAL: Good  CLINICAL DECISION MAKING: Evolving/moderate complexity  EVALUATION COMPLEXITY: Moderate   GOALS: Goals reviewed with patient? No  SHORT TERM GOALS: Target date: 11/23/2023   Will be compliant with appropriate progressive HEP  Baseline: Goal status: MET 11/23/23  2.  Will be able to name 3 ways to prevent falls at home and in the community  Baseline:  Goal status: MET 11/23/23  3.  Will be able to perform 5xSTS and with distant S Baseline: MinA  Goal status: ONGOING 11/23/23  4.  Will complete TUG test in 12 seconds or less S  Baseline: MinA  Goal status: MET 11/20/23    LONG TERM GOALS: Target date: 12/21/2023    Will score at least 18 on DGI to show improved functional balance  Baseline:  Goal status: INITIAL  2.  Will be able to ambulate community distances (level and inclines) no device, RPE no more than 2/10  Baseline:  Goal status: INITIAL  3.  Will be able to perform all desired gym activities without increased pain from resting levels  Baseline:  Goal status: INITIAL  4.  ABC score to improve by at least 20 points to show improved subjective status  Baseline:  Goal status: INITIAL     PLAN:  PT FREQUENCY: 2x/week  PT DURATION: 8 weeks  PLANNED INTERVENTIONS: 97164- PT Re-evaluation, 97110-Therapeutic exercises, 97530- Therapeutic activity, 97112- Neuromuscular re-education, 97535- Self Care, [id number]- Manual therapy, [phone]- Gait training, [phone]- Aquatic Therapy, and Balance training  PLAN FOR NEXT SESSION: balance and coordination,  functional activity tolerance, work on some quad strength and general LE endurance   Nedra Hai, PT, DPT 11/27/23 12:21 PM

## 2023-11-30 ENCOUNTER — Ambulatory Visit: Payer: Medicare Other | Admitting: Physical Therapy

## 2023-11-30 ENCOUNTER — Encounter: Payer: Self-pay | Admitting: Physical Therapy

## 2023-11-30 DIAGNOSIS — M6281 Muscle weakness (generalized): Secondary | ICD-10-CM

## 2023-11-30 DIAGNOSIS — R2681 Unsteadiness on feet: Secondary | ICD-10-CM

## 2023-11-30 DIAGNOSIS — R278 Other lack of coordination: Secondary | ICD-10-CM | POA: Diagnosis not present

## 2023-11-30 DIAGNOSIS — R262 Difficulty in walking, not elsewhere classified: Secondary | ICD-10-CM

## 2023-11-30 DIAGNOSIS — R531 Weakness: Secondary | ICD-10-CM | POA: Diagnosis not present

## 2023-11-30 DIAGNOSIS — R296 Repeated falls: Secondary | ICD-10-CM | POA: Diagnosis not present

## 2023-11-30 NOTE — Therapy (Signed)
 OUTPATIENT PHYSICAL THERAPY LOWER EXTREMITY TREATMENT    Patient Name: Timothy Moran MRN: [id number] DOB:09-17-1945, 78 y.o., male Today's Date: 11/30/2023  END OF SESSION:  PT End of Session - 11/30/23 1146     Visit Number 6    Number of Visits 17    Date for PT Re-Evaluation 12/21/23    Authorization Type MCR    Progress Note Due on Visit 10    PT Start Time 1146    PT Stop Time 1227    PT Time Calculation (min) 41 min    Activity Tolerance Patient tolerated treatment well    Behavior During Therapy [facility] for tasks assessed/performed                  Past Medical History:  Diagnosis Date   Acute blood loss anemia 10/05/2020   Acute respiratory failure (HCC), hypothermia therapy, vent - extubated 10/06/20    AKI (acute kidney injury) (HCC) 10/05/2020   Anoxic brain injury (HCC) 10/26/2020   Arrhythmia    Ascending aortic aneurysm (HCC) 01/29/2018   a. 2019 4.3 cm by echo; b. 02/2021 Echo: Ao root 40mm.   Benign brain tumor (HCC)    Benign neoplasm of brain (HCC) 12/19/2017   Cardiac arrest with ventricular fibrillation (HCC)    Chest pain 12/04/2017   Chronic kidney disease, stage III (moderate) (HCC) 12/19/2017   CKD (chronic kidney disease), stage III - IV (HCC)    Conductive hearing loss of both ears 08/24/2018   Coronary Artery Disease    a. 09/2020 Inf STEMI complicated by cardiac arrest/PCI: LAD 50p/m, LCX 100d (2.5 x 30 Resolute Onyx DES); b. 04/2021 PCI: 04/2021 LM nl, LAD 50p/48m (2.5x30 Onyx Frontier DES), LCX 25p, 79m (RFR 0.98), 40d ISR (RFR 0.98), OM2 25, RCA mild diff dzs.   Diabetes mellitus, type 2 (HCC) 10/06/2020   10/06/20 A1C 7%   Dizziness 10/11/2019   Encounter for imaging study to confirm orogastric (OG) tube placement    Eustachian tube dysfunction 08/24/2018   Hematuria 10/26/2020   HFrEF (heart failure with reduced ejection fraction) (HCC)    a. 11/2020 Echo: EF 25-30%; b. 02/2021 Echo: EF 25-30%, glob HK. Mild LVH. Nl RV fxn. Triv AI. Ao  root 40mm.   History of pulmonary embolus (PE)    HLD (hyperlipidemia) 10/26/2020   Hypertension    Iron deficiency anemia rec'd IV iron 10/26/2020   Ischemic cardiomyopathy    a. 11/2020 Echo: EF 25-30%; b. 02/2021 Echo: EF 25-30%, glob HK.   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 Marland KitchenSpirometry 03/08/2018    FEV1 3.86 (108%)  Ratio 96 s prior rx  - PET  03/13/18   Low grade c/w adenoca > rec T surgery eval     STEMI (ST elevation myocardial infarction) (HCC) 10/04/2020  Urinary retention 10/26/2020   Vestibular schwannoma (HCC) 08/14/2020   Past Surgical History:  Procedure Laterality Date   APPENDECTOMY     BIV ICD INSERTION CRT-D N/A 11/03/2021   Procedure: BIV ICD INSERTION CRT-D;  Surgeon: Regan Lemming, MD;  Location: [facility] INVASIVE CV LAB;  Service: Cardiovascular;  Laterality: N/A;   CARDIAC CATHETERIZATION     CORONARY ANGIOPLASTY WITH STENT PLACEMENT Left 04/12/2021   LAD stent placement   CORONARY PRESSURE/FFR STUDY N/A 04/12/2021   Procedure: INTRAVASCULAR PRESSURE WIRE/FFR STUDY;  Surgeon: Yvonne Kendall, MD;  Location: [facility] INVASIVE CV LAB;  Service: Cardiovascular;  Laterality: N/A;   CORONARY STENT INTERVENTION N/A 04/12/2021   Procedure: CORONARY STENT INTERVENTION;  Surgeon: Yvonne Kendall, MD;  Location: [facility] INVASIVE CV LAB;  Service:  Cardiovascular;  Laterality: N/A;   CORONARY ULTRASOUND/IVUS N/A 04/12/2021   Procedure: Intravascular Ultrasound/IVUS;  Surgeon: Yvonne Kendall, MD;  Location: [facility] INVASIVE CV LAB;  Service: Cardiovascular;  Laterality: N/A;   CORONARY/GRAFT ACUTE MI REVASCULARIZATION N/A 10/04/2020   Procedure: Coronary/Graft Acute MI Revascularization;  Surgeon: Yvonne Kendall, MD;  Location: [facility] INVASIVE CV LAB;  Service: Cardiovascular;  Laterality: N/A;   HERNIA REPAIR     LEFT HEART CATH AND CORONARY ANGIOGRAPHY N/A 10/04/2020   Procedure: LEFT HEART CATH AND CORONARY ANGIOGRAPHY;  Surgeon: Yvonne Kendall, MD;  Location: [facility] INVASIVE CV LAB;  Service: Cardiovascular;  Laterality: N/A;   RIGHT/LEFT HEART CATH AND CORONARY ANGIOGRAPHY N/A 04/12/2021   Procedure: RIGHT/LEFT HEART CATH AND CORONARY ANGIOGRAPHY;  Surgeon: Yvonne Kendall, MD;  Location: [facility] INVASIVE CV LAB;  Service: Cardiovascular;  Laterality: N/A;   Patient Active Problem List   Diagnosis Date Noted   Anemia    Leukocytosis    Right arm weakness 01/25/2022   Right sided weakness    Fall at home 01/22/2022   Status post biventricular pacemaker - MRI compatible PPM and leads 01/22/2022   Chronic indwelling Foley catheter 01/22/2022   CKD (chronic kidney disease), stage III - IV (HCC) 06/28/2021   Chronic HFrEF (heart failure with reduced ejection fraction) (HCC) 03/31/2021   Arrhythmia    DM (diabetes mellitus), type 2 (HCC) new 10/26/2020   Urinary retention 10/26/2020   Hyperlipidemia LDL goal <70 10/26/2020   Coronary artery disease of native artery of native heart with stable angina pectoris (HCC) 10/26/2020   S/P angioplasty with stent 10/04/20 DES to LCX  10/26/2020   Ischemic cardiomyopathy 10/26/2020   Diabetes mellitus, type 2 (HCC) 10/06/2020   DOE (dyspnea on exertion)    Meningioma (HCC) 08/14/2020   Vestibular schwannoma (HCC) 08/14/2020   Cardiac arrest with ventricular fibrillation (HCC)    Pain of left hip joint  05/29/2020   Benign brain tumor (HCC)    Chronic kidney disease    History of pulmonary embolus (PE)    Hypertension    Nonsustained ventricular tachycardia (HCC) 10/11/2019   Dizziness 10/11/2019   Abnormal echocardiogram 06/07/2019   Eustachian tube dysfunction 08/24/2018   Conductive hearing loss of both ears 08/24/2018   Pulmonary hypertension due to thromboembolism (HCC) 03/09/2018   Solitary pulmonary nodule on lung CT 03/08/2018   Ascending aortic aneurysm (HCC) 4.2 cm based on CT from December 2020 01/29/2018   Sinus bradycardia 12/22/2017   Left bundle branch block 12/19/2017   Personal history of pulmonary embolism - no longer on anticoagulation due to pt refusal to take it anymore. 12/19/2017   Chronic kidney disease, stage III (moderate) (HCC) 12/19/2017   Benign neoplasm of brain (HCC)  12/19/2017    PCP: Gweneth Dimitri, MD  REFERRING PROVIDER: Gweneth Dimitri, MD  REFERRING DIAG: Free Text Diagnosis R29.6, R26.89, R26.9 Mult. Falls, Poor Balance, Gait Disturbance  THERAPY DIAG:  Unsteadiness on feet  Other lack of coordination  Muscle weakness (generalized)  Difficulty in walking, not elsewhere classified  Rationale for Evaluation and Treatment: Rehabilitation  ONSET DATE: chronic   SUBJECTIVE:   SUBJECTIVE STATEMENT:  Things are going good about the same. Looking forward to being able to stand up without having to collect myself.    EVAL: Having issues with balance and walking. I do have a meningioma in my spine, and every once in awhile I have pain if I twist or stress it. Still having pain in my neck and hip from a fall that I had about a year and a half ago. Thought that pain would go away but it hasn't even this far out/being this healed. I have a lot of unsteadiness, not sure I'd call it dizziness but I have to stand for a minute before I get going. Have a couple of walking sticks/canes but not sure how to use them honestly. Entire R LE and UE are   still numb. When I fell I was at a friend's house and were leaving, about an hour before that I had a glass of wine, I went out first and held the door for the next people but didn't realize that the concrete platform ended and dropped about 15 inches and ended up stepping off that 15 inch ledge and hit my head/shoulder/hip/knee on the wall beyond that. Meningioma is still just being monitored, saw the MD and they said its not growing but its just there in the spine. Every single movement I make with my neck hurts when I get to the end of that range.   PERTINENT HISTORY: See above- complex medical hx  PAIN:  Are you having pain? No "just the normal pains and aches, nothing abnormal"   PRECAUTIONS: Fall and Other: caution with movements, some movements can cause increased pressure/pain at meningioma site, ICD    RED FLAGS: None   WEIGHT BEARING RESTRICTIONS: No  FALLS:  Has patient fallen in last 6 months? Yes. Number of falls 1- had a mis-step coming off a ladder, fell backwards and hit the floor pretty hard  LIVING ENVIRONMENT: Lives with: lives with their spouse Lives in: House/apartment Stairs:  5 STE with B rails, has second floor in his home but holds rail    OCCUPATION: retired   PLOF: Independent, Independent with basic ADLs, Independent with gait, and Independent with transfers  PATIENT GOALS: getting more active, improving balance   NEXT MD VISIT: Referring scheduled but unsure when   OBJECTIVE:  Note: Objective measures were completed at Evaluation unless otherwise noted.    PATIENT SURVEYS:  ABC scale 65.6%  COGNITION: Overall cognitive status: Within functional limits for tasks assessed     SENSATION: Complete R UE and R LE numbness (chronic)    COORDINATION  Noted some ataxia in R LE with functional movements   LOWER EXTREMITY MMT:  MMT Right eval Left eval  Hip flexion 4+ 5  Hip extension    Hip abduction 5 5  Hip adduction    Hip internal  rotation    Hip external rotation    Knee flexion 5 5  Knee extension 5 5  Ankle dorsiflexion 5 5  Ankle plantarflexion    Ankle inversion    Ankle eversion     (  Blank rows = not tested)   FUNCTIONAL TESTS:  5 times sit to stand: 15.4 seconds use of UEs, some difficulty stabilizing; 11/20/23 15.2 seconds use of UEs  Timed up and go (TUG): 14.2 seconds no device but did need MinA for balance when initially standing and then turning to sit in the chair at end of test; 11/20/23 10 seconds  3 minute walk test: 662ft no device but needed MinA at times for balance on corners, RPE 4/10  GAIT: Distance walked: 664ft Assistive device utilized: None Level of assistance: Min A Comments: MinA for balance with turns and when navigating obstacles, some ataxic movements noted                                                                                                                                 TREATMENT DATE:    11/30/23   Wide tandem stance blue foam pad 3x30 seconds B  Forward toe taps to target while standing on blue foam pad x20 minA  Stepping over half foam rolls + alternating toe taps to targets solid surface x3 rounds // bars  Nustep L6x2.5 minutes then 7.5 minutes L5 BLEs only  Figure 8 walks x2 each direction Backwards walking in bars solid surface min guard x5 laps Standing one foot on soft surface of BOSU/other on floor 3x20 seconds B       11/27/23  Alternating forward toe taps to targets on 4 inch steps x20 Lateral 4 inch step taps x10 B  Double 4 inch step taps x10 B Cross midline double 4 inch step taps x5 B up to ModA  Side steps in // bars x5 rounds solid surface S 90* turns CW, then CCW, then in randomized directions up to ModA  Randomized perturbations on solid surface (tried blue foam pad but not ready for perturbations yet)   Nustep L6x5 + L5x5 minutes all four extremities for functional activity tolerance and strength       11/23/23  [place]  BOS solid surface 3x30 seconds  Forward step overs half foam roll up to ModA for balance, difficulty with R LE motor control when stepping with L LE Lateral step overs half foam roll up to MinA for balance, not as challenging as forward steps Alternating toe taps to air targets x20 min guard  Standing with one LE on small air pad and other on solid surface 3x30 seconds B up to MinA for balance  Cross midline target taps solid surface x10 B up to ModA for balance  Nustep L6x5 minutes all four extremities for promotion of reciprocal motion TUG and 5xSTS checks for STG update         PATIENT EDUCATION:  Education details: exam findings, POC Person educated: Patient Education method: Explanation, Demonstration, and Verbal cues Education comprehension: verbalized understanding, returned demonstration, and needs further education  HOME EXERCISE PROGRAM:  Access Code: [email or web address] URL: [email or web address] Date: 11/20/2023 Prepared by: Nedra Hai  Exercises - Wide Tandem Stance with Eyes Open  - 1 x daily - 7 x weekly - 2 sets - 3 reps - 30 seconds  hold - Romberg Stance  - 1 x daily - 7 x weekly - 2 sets - 3 reps - 30 second  hold - Single Leg Stance with Support  - 1 x daily - 7 x weekly - 2 sets - 3 reps    ASSESSMENT:  CLINICAL IMPRESSION:  Pt arrived today with questions on if he was progressing- assured him that per objective measures and from subjective observations from PT with activities he is improving, its a matter of time working on reaction time/balance/endurance. Continued progressions of balance and endurance as tolerated. Did really well today, able to perform tasks that would have been next to impossible for him at eval.      EVAL: Patient is a 78 y.o. M who was seen today for physical therapy evaluation and treatment for Free Text Diagnosis R29.6, R26.89, R26.9 Mult. Falls, Poor Balance, Gait Disturbance. Very pleasant and cooperative but very  talkative during eval which did limit assessment time. Still having a lot of pain in general since his fall  and ongoing numbness in R UE/LE. Gait mechanics and balance are definitely impaired, would benefit from training with appropriate AD if he is open to this. Will benefit from skilled PT services to address his concerns and functional objective impairments to the best of our ability.   OBJECTIVE IMPAIRMENTS: Abnormal gait, cardiopulmonary status limiting activity, decreased activity tolerance, decreased balance, decreased mobility, and difficulty walking.   ACTIVITY LIMITATIONS: standing, squatting, stairs, transfers, reach over head, and locomotion level  PARTICIPATION LIMITATIONS: driving, shopping, community activity, and occupation  PERSONAL FACTORS: Age, Behavior pattern, Fitness, Past/current experiences, Social background, and Time since onset of injury/illness/exacerbation are also affecting patient's functional outcome.   REHAB POTENTIAL: Good  CLINICAL DECISION MAKING: Evolving/moderate complexity  EVALUATION COMPLEXITY: Moderate   GOALS: Goals reviewed with patient? No  SHORT TERM GOALS: Target date: 11/23/2023   Will be compliant with appropriate progressive HEP  Baseline: Goal status: MET 11/23/23  2.  Will be able to name 3 ways to prevent falls at home and in the community  Baseline:  Goal status: MET 11/23/23  3.  Will be able to perform 5xSTS and with distant S Baseline: MinA  Goal status: ONGOING 11/23/23  4.  Will complete TUG test in 12 seconds or less S  Baseline: MinA  Goal status: MET 11/20/23    LONG TERM GOALS: Target date: 12/21/2023    Will score at least 18 on DGI to show improved functional balance  Baseline:  Goal status: INITIAL  2.  Will be able to ambulate community distances (level and inclines) no device, RPE no more than 2/10  Baseline:  Goal status: INITIAL  3.  Will be able to perform all desired gym activities without  increased pain from resting levels  Baseline:  Goal status: INITIAL  4.  ABC score to improve by at least 20 points to show improved subjective status  Baseline:  Goal status: INITIAL     PLAN:  PT FREQUENCY: 2x/week  PT DURATION: 8 weeks  PLANNED INTERVENTIONS: 97164- PT Re-evaluation, 97110-Therapeutic exercises, 97530- Therapeutic activity, [id number]- Neuromuscular re-education, 97535- Self Care, [id number]- Manual therapy, [id number]- Gait training, [phone]- Aquatic Therapy, and Balance training  PLAN FOR NEXT SESSION: balance and coordination progressions, functional activity tolerance, work on some quad strength and general LE endurance  Nedra Hai, PT, DPT 11/30/23 12:30 PM

## 2023-12-04 ENCOUNTER — Ambulatory Visit: Payer: Medicare Other | Admitting: Physical Therapy

## 2023-12-04 ENCOUNTER — Encounter: Payer: Self-pay | Admitting: Physical Therapy

## 2023-12-04 DIAGNOSIS — R262 Difficulty in walking, not elsewhere classified: Secondary | ICD-10-CM | POA: Diagnosis not present

## 2023-12-04 DIAGNOSIS — R278 Other lack of coordination: Secondary | ICD-10-CM | POA: Diagnosis not present

## 2023-12-04 DIAGNOSIS — R2681 Unsteadiness on feet: Secondary | ICD-10-CM | POA: Diagnosis not present

## 2023-12-04 DIAGNOSIS — R531 Weakness: Secondary | ICD-10-CM | POA: Diagnosis not present

## 2023-12-04 DIAGNOSIS — M6281 Muscle weakness (generalized): Secondary | ICD-10-CM | POA: Diagnosis not present

## 2023-12-04 DIAGNOSIS — R296 Repeated falls: Secondary | ICD-10-CM | POA: Diagnosis not present

## 2023-12-04 NOTE — Therapy (Signed)
 OUTPATIENT PHYSICAL THERAPY LOWER EXTREMITY TREATMENT    Patient Name: Timothy Moran MRN: [id number] DOB:[birth year], 78 y.o., male Today's Date: 12/04/2023  END OF SESSION:  PT End of Session - 12/04/23 1158     Visit Number 7    Number of Visits 17    Date for PT Re-Evaluation 12/21/23    Authorization Type MCR    Progress Note Due on Visit 10    PT Start Time 1147    PT Stop Time 1227    PT Time Calculation (min) 40 min    Activity Tolerance Patient tolerated treatment well    Behavior During Therapy [facility] for tasks assessed/performed                   Past Medical History:  Diagnosis Date   Acute blood loss anemia 10/05/2020   Acute respiratory failure (HCC), hypothermia therapy, vent - extubated 10/06/20    AKI (acute kidney injury) (HCC) 10/05/2020   Anoxic brain injury (HCC) 10/26/2020   Arrhythmia    Ascending aortic aneurysm (HCC) 01/29/2018   a. 2019 4.3 cm by echo; b. 02/2021 Echo: Ao root 40mm.   Benign brain tumor (HCC)    Benign neoplasm of brain (HCC) 12/19/2017   Cardiac arrest with ventricular fibrillation (HCC)    Chest pain 12/04/2017   Chronic kidney disease, stage III (moderate) (HCC) 12/19/2017   CKD (chronic kidney disease), stage III - IV (HCC)    Conductive hearing loss of both ears 08/24/2018   Coronary Artery Disease    a. 09/2020 Inf STEMI complicated by cardiac arrest/PCI: LAD 50p/m, LCX 100d (2.5 x 30 Resolute Onyx DES); b. 04/2021 PCI: 04/2021 LM nl, LAD 50p/44m (2.5x30 Onyx Frontier DES), LCX 25p, 8m (RFR 0.98), 40d ISR (RFR 0.98), OM2 25, RCA mild diff dzs.   Diabetes mellitus, type 2 (HCC) 10/06/2020   10/06/20 A1C 7%   Dizziness 10/11/2019   Encounter for imaging study to confirm orogastric (OG) tube placement    Eustachian tube dysfunction 08/24/2018   Hematuria 10/26/2020   HFrEF (heart failure with reduced ejection fraction) (HCC)    a. 11/2020 Echo: EF 25-30%; b. 02/2021 Echo: EF 25-30%, glob HK. Mild LVH. Nl RV fxn. Triv AI.  Ao root 40mm.   History of pulmonary embolus (PE)    HLD (hyperlipidemia) 10/26/2020   Hypertension    Iron deficiency anemia rec'd IV iron 10/26/2020   Ischemic cardiomyopathy    a. 11/2020 Echo: EF 25-30%; b. 02/2021 Echo: EF 25-30%, glob HK.   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 Marland KitchenSpirometry 03/08/2018    FEV1 3.86 (108%)  Ratio 96 s prior rx  - PET  03/13/18   Low grade c/w adenoca > rec T surgery eval     STEMI (ST elevation myocardial infarction) (HCC) 10/04/2020  Urinary retention 10/26/2020   Vestibular schwannoma (HCC) 08/14/2020   Past Surgical History:  Procedure Laterality Date   APPENDECTOMY     BIV ICD INSERTION CRT-D N/A 11/03/2021   Procedure: BIV ICD INSERTION CRT-D;  Surgeon: Regan Lemming, MD;  Location: [facility] INVASIVE CV LAB;  Service: Cardiovascular;  Laterality: N/A;   CARDIAC CATHETERIZATION     CORONARY ANGIOPLASTY WITH STENT PLACEMENT Left 04/12/2021   LAD stent placement   CORONARY PRESSURE/FFR STUDY N/A 04/12/2021   Procedure: INTRAVASCULAR PRESSURE WIRE/FFR STUDY;  Surgeon: Yvonne Kendall, MD;  Location: [facility] INVASIVE CV LAB;  Service: Cardiovascular;  Laterality: N/A;   CORONARY STENT INTERVENTION N/A 04/12/2021   Procedure: CORONARY STENT INTERVENTION;  Surgeon: Yvonne Kendall, MD;  Location: [facility] INVASIVE CV LAB;  Service:  Cardiovascular;  Laterality: N/A;   CORONARY ULTRASOUND/IVUS N/A 04/12/2021   Procedure: Intravascular Ultrasound/IVUS;  Surgeon: Yvonne Kendall, MD;  Location: [facility] INVASIVE CV LAB;  Service: Cardiovascular;  Laterality: N/A;   CORONARY/GRAFT ACUTE MI REVASCULARIZATION N/A 10/04/2020   Procedure: Coronary/Graft Acute MI Revascularization;  Surgeon: Yvonne Kendall, MD;  Location: [facility] INVASIVE CV LAB;  Service: Cardiovascular;  Laterality: N/A;   HERNIA REPAIR     LEFT HEART CATH AND CORONARY ANGIOGRAPHY N/A 10/04/2020   Procedure: LEFT HEART CATH AND CORONARY ANGIOGRAPHY;  Surgeon: Yvonne Kendall, MD;  Location: [facility] INVASIVE CV LAB;  Service: Cardiovascular;  Laterality: N/A;   RIGHT/LEFT HEART CATH AND CORONARY ANGIOGRAPHY N/A 04/12/2021   Procedure: RIGHT/LEFT HEART CATH AND CORONARY ANGIOGRAPHY;  Surgeon: Yvonne Kendall, MD;  Location: [facility] INVASIVE CV LAB;  Service: Cardiovascular;  Laterality: N/A;   Patient Active Problem List   Diagnosis Date Noted   Anemia    Leukocytosis    Right arm weakness 01/25/2022   Right sided weakness    Fall at home 01/22/2022   Status post biventricular pacemaker - MRI compatible PPM and leads 01/22/2022   Chronic indwelling Foley catheter 01/22/2022   CKD (chronic kidney disease), stage III - IV (HCC) 06/28/2021   Chronic HFrEF (heart failure with reduced ejection fraction) (HCC) 03/31/2021   Arrhythmia    DM (diabetes mellitus), type 2 (HCC) new 10/26/2020   Urinary retention 10/26/2020   Hyperlipidemia LDL goal <70 10/26/2020   Coronary artery disease of native artery of native heart with stable angina pectoris (HCC) 10/26/2020   S/P angioplasty with stent 10/04/20 DES to LCX  10/26/2020   Ischemic cardiomyopathy 10/26/2020   Diabetes mellitus, type 2 (HCC) 10/06/2020   DOE (dyspnea on exertion)    Meningioma (HCC) 08/14/2020   Vestibular schwannoma (HCC) 08/14/2020   Cardiac arrest with ventricular fibrillation (HCC)    Pain of left hip joint  05/29/2020   Benign brain tumor (HCC)    Chronic kidney disease    History of pulmonary embolus (PE)    Hypertension    Nonsustained ventricular tachycardia (HCC) 10/11/2019   Dizziness 10/11/2019   Abnormal echocardiogram 06/07/2019   Eustachian tube dysfunction 08/24/2018   Conductive hearing loss of both ears 08/24/2018   Pulmonary hypertension due to thromboembolism (HCC) 03/09/2018   Solitary pulmonary nodule on lung CT 03/08/2018   Ascending aortic aneurysm (HCC) 4.2 cm based on CT from December 2020 01/29/2018   Sinus bradycardia 12/22/2017   Left bundle branch block 12/19/2017   Personal history of pulmonary embolism - no longer on anticoagulation due to pt refusal to take it anymore. 12/19/2017   Chronic kidney disease, stage III (moderate) (HCC) 12/19/2017   Benign neoplasm of brain (HCC)  12/19/2017    PCP: Gweneth Dimitri, MD  REFERRING PROVIDER: Gweneth Dimitri, MD  REFERRING DIAG: Free Text Diagnosis R29.6, R26.89, R26.9 Mult. Falls, Poor Balance, Gait Disturbance  THERAPY DIAG:  Unsteadiness on feet  Other lack of coordination  Muscle weakness (generalized)  Difficulty in walking, not elsewhere classified  Rationale for Evaluation and Treatment: Rehabilitation  ONSET DATE: chronic   SUBJECTIVE:   SUBJECTIVE STATEMENT:  Things are going good about the same. Nothing really new, no falls     EVAL: Having issues with balance and walking. I do have a meningioma in my spine, and every once in awhile I have pain if I twist or stress it. Still having pain in my neck and hip from a fall that I had about a year and a half ago. Thought that pain would go away but it hasn't even this far out/being this healed. I have a lot of unsteadiness, not sure I'd call it dizziness but I have to stand for a minute before I get going. Have a couple of walking sticks/canes but not sure how to use them honestly. Entire R LE and UE are  still numb. When I fell I was at a friend's house  and were leaving, about an hour before that I had a glass of wine, I went out first and held the door for the next people but didn't realize that the concrete platform ended and dropped about 15 inches and ended up stepping off that 15 inch ledge and hit my head/shoulder/hip/knee on the wall beyond that. Meningioma is still just being monitored, saw the MD and they said its not growing but its just there in the spine. Every single movement I make with my neck hurts when I get to the end of that range.   PERTINENT HISTORY: See above- complex medical hx  PAIN:  Are you having pain? No "just the normal pains and aches, nothing abnormal/the usual"   PRECAUTIONS: Fall and Other: caution with movements, some movements can cause increased pressure/pain at meningioma site, ICD    RED FLAGS: None   WEIGHT BEARING RESTRICTIONS: No  FALLS:  Has patient fallen in last 6 months? Yes. Number of falls 1- had a mis-step coming off a ladder, fell backwards and hit the floor pretty hard  LIVING ENVIRONMENT: Lives with: lives with their spouse Lives in: House/apartment Stairs:  5 STE with B rails, has second floor in his home but holds rail    OCCUPATION: retired   PLOF: Independent, Independent with basic ADLs, Independent with gait, and Independent with transfers  PATIENT GOALS: getting more active, improving balance   NEXT MD VISIT: Referring scheduled but unsure when   OBJECTIVE:  Note: Objective measures were completed at Evaluation unless otherwise noted.    PATIENT SURVEYS:  ABC scale 65.6%  COGNITION: Overall cognitive status: Within functional limits for tasks assessed     SENSATION: Complete R UE and R LE numbness (chronic)    COORDINATION  Noted some ataxia in R LE with functional movements   LOWER EXTREMITY MMT:  MMT Right eval Left eval  Hip flexion 4+ 5  Hip extension    Hip abduction 5 5  Hip adduction    Hip internal rotation    Hip external rotation    Knee  flexion 5 5  Knee extension 5 5  Ankle dorsiflexion 5 5  Ankle plantarflexion    Ankle inversion    Ankle eversion     (Blank rows = not  tested)   FUNCTIONAL TESTS:  5 times sit to stand: 15.4 seconds use of UEs, some difficulty stabilizing; 11/20/23 15.2 seconds use of UEs  Timed up and go (TUG): 14.2 seconds no device but did need MinA for balance when initially standing and then turning to sit in the chair at end of test; 11/20/23 10 seconds  3 minute walk test: 67ft no device but needed MinA at times for balance on corners, RPE 4/10  GAIT: Distance walked: 614ft Assistive device utilized: None Level of assistance: Min A Comments: MinA for balance with turns and when navigating obstacles, some ataxic movements noted                                                                                                                                 TREATMENT DATE:     12/04/23  Nustep all four extremities L6x5 minutes, then L5 for an additional 5 minutes    Wide tandem stance blue foam pad 3x30 seconds B  Forward step downs from blue foam pad in // bars light BUE support x10 B Lateral step downs from blue foam pad in // bars light BUE support x10 B, one time MinA for balance recovery  Backwards step downs from blue foam pad in // bars light BUE support x10 B Forward/backward stepping off blue foam pad (trying to clear pad with swing LE) x5 B close min guard  Walking on long blue foam planks very close min guard in // bars x3 laps     11/30/23   Wide tandem stance blue foam pad 3x30 seconds B  Forward toe taps to target while standing on blue foam pad x20 minA  Stepping over half foam rolls + alternating toe taps to targets solid surface x3 rounds // bars  Nustep L6x2.5 minutes then 7.5 minutes L5 BLEs only  Figure 8 walks x2 each direction Backwards walking in bars solid surface min guard x5 laps Standing one foot on soft surface of BOSU/other on floor 3x20 seconds B        11/27/23  Alternating forward toe taps to targets on 4 inch steps x20 Lateral 4 inch step taps x10 B  Double 4 inch step taps x10 B Cross midline double 4 inch step taps x5 B up to ModA  Side steps in // bars x5 rounds solid surface S 90* turns CW, then CCW, then in randomized directions up to ModA  Randomized perturbations on solid surface (tried blue foam pad but not ready for perturbations yet)   Nustep L6x5 + L5x5 minutes all four extremities for functional activity tolerance and strength       11/23/23  Narrow BOS solid surface 3x30 seconds  Forward step overs half foam roll up to ModA for balance, difficulty with R LE motor control when stepping with L LE Lateral step overs half foam roll up to MinA for balance, not as challenging as forward steps Alternating toe  taps to air targets x20 min guard  Standing with one LE on small air pad and other on solid surface 3x30 seconds B up to MinA for balance  Cross midline target taps solid surface x10 B up to ModA for balance  Nustep L6x5 minutes all four extremities for promotion of reciprocal motion TUG and 5xSTS checks for STG update         PATIENT EDUCATION:  Education details: exam findings, POC Person educated: Patient Education method: Explanation, Demonstration, and Verbal cues Education comprehension: verbalized understanding, returned demonstration, and needs further education  HOME EXERCISE PROGRAM:  Access Code: [email or web address] URL: [email or web address] Date: 11/20/2023 Prepared by: Nedra Hai  Exercises - Wide Tandem Stance with Eyes Open  - 1 x daily - 7 x weekly - 2 sets - 3 reps - 30 seconds  hold - Romberg Stance  - 1 x daily - 7 x weekly - 2 sets - 3 reps - 30 second  hold - Single Leg Stance with Support  - 1 x daily - 7 x weekly - 2 sets - 3 reps    ASSESSMENT:  CLINICAL IMPRESSION:  Pt arrived today doing well, we continued working on functional activity tolerance and  balance progressions as tolerated. Continued work on compliant surfaces and narrowing BOS, also experimented with more dynamic motions on foam/compliant surfaces today. Will continue to progress, continues to need close guarding in // bars but has really been demonstrating good balance reactions here recently in terms of using bars to stabilize during cases of sudden losses of balance.     EVAL: Patient is a 78 y.o. M who was seen today for physical therapy evaluation and treatment for Free Text Diagnosis R29.6, R26.89, R26.9 Mult. Falls, Poor Balance, Gait Disturbance. Very pleasant and cooperative but very talkative during eval which did limit assessment time. Still having a lot of pain in general since his fall  and ongoing numbness in R UE/LE. Gait mechanics and balance are definitely impaired, would benefit from training with appropriate AD if he is open to this. Will benefit from skilled PT services to address his concerns and functional objective impairments to the best of our ability.   OBJECTIVE IMPAIRMENTS: Abnormal gait, cardiopulmonary status limiting activity, decreased activity tolerance, decreased balance, decreased mobility, and difficulty walking.   ACTIVITY LIMITATIONS: standing, squatting, stairs, transfers, reach over head, and locomotion level  PARTICIPATION LIMITATIONS: driving, shopping, community activity, and occupation  PERSONAL FACTORS: Age, Behavior pattern, Fitness, Past/current experiences, Social background, and Time since onset of injury/illness/exacerbation are also affecting patient's functional outcome.   REHAB POTENTIAL: Good  CLINICAL DECISION MAKING: Evolving/moderate complexity  EVALUATION COMPLEXITY: Moderate   GOALS: Goals reviewed with patient? No  SHORT TERM GOALS: Target date: 11/23/2023   Will be compliant with appropriate progressive HEP  Baseline: Goal status: MET 11/23/23  2.  Will be able to name 3 ways to prevent falls at home and in the  community  Baseline:  Goal status: MET 11/23/23  3.  Will be able to perform 5xSTS and with distant S Baseline: MinA  Goal status: ONGOING 11/23/23  4.  Will complete TUG test in 12 seconds or less S  Baseline: MinA  Goal status: MET 11/20/23    LONG TERM GOALS: Target date: 12/21/2023    Will score at least 18 on DGI to show improved functional balance  Baseline:  Goal status: INITIAL  2.  Will be able to ambulate community distances (level and inclines) no device, RPE no  more than 2/10  Baseline:  Goal status: INITIAL  3.  Will be able to perform all desired gym activities without increased pain from resting levels  Baseline:  Goal status: INITIAL  4.  ABC score to improve by at least 20 points to show improved subjective status  Baseline:  Goal status: INITIAL     PLAN:  PT FREQUENCY: 2x/week  PT DURATION: 8 weeks  PLANNED INTERVENTIONS: 97164- PT Re-evaluation, 97110-Therapeutic exercises, 97530- Therapeutic activity, 97112- Neuromuscular re-education, 97535- Self Care, 95621- Manual therapy, [phone]- Gait training, [phone]- Aquatic Therapy, and Balance training  PLAN FOR NEXT SESSION: balance and coordination progressions with compliant surfaces , functional activity tolerance, work on some quad strength and general LE endurance   Nedra Hai, PT, DPT 12/04/23 12:28 PM

## 2023-12-07 ENCOUNTER — Encounter: Payer: Self-pay | Admitting: Physical Therapy

## 2023-12-07 ENCOUNTER — Ambulatory Visit: Payer: Medicare Other | Admitting: Physical Therapy

## 2023-12-07 DIAGNOSIS — R278 Other lack of coordination: Secondary | ICD-10-CM | POA: Diagnosis not present

## 2023-12-07 DIAGNOSIS — Z79899 Other long term (current) drug therapy: Secondary | ICD-10-CM | POA: Diagnosis not present

## 2023-12-07 DIAGNOSIS — R2681 Unsteadiness on feet: Secondary | ICD-10-CM | POA: Diagnosis not present

## 2023-12-07 DIAGNOSIS — R531 Weakness: Secondary | ICD-10-CM | POA: Diagnosis not present

## 2023-12-07 DIAGNOSIS — R262 Difficulty in walking, not elsewhere classified: Secondary | ICD-10-CM | POA: Diagnosis not present

## 2023-12-07 DIAGNOSIS — M6281 Muscle weakness (generalized): Secondary | ICD-10-CM | POA: Diagnosis not present

## 2023-12-07 DIAGNOSIS — Z8639 Personal history of other endocrine, nutritional and metabolic disease: Secondary | ICD-10-CM | POA: Diagnosis not present

## 2023-12-07 DIAGNOSIS — R296 Repeated falls: Secondary | ICD-10-CM

## 2023-12-07 DIAGNOSIS — I25118 Atherosclerotic heart disease of native coronary artery with other forms of angina pectoris: Secondary | ICD-10-CM | POA: Diagnosis not present

## 2023-12-07 NOTE — Therapy (Signed)
 OUTPATIENT PHYSICAL THERAPY LOWER EXTREMITY TREATMENT    Patient Name: Timothy Moran MRN: [id number] DOB:November 23, 1945, 78 y.o., male Today's Date: 12/07/2023  END OF SESSION:  PT End of Session - 12/07/23 1143     Visit Number 8    Date for PT Re-Evaluation 12/21/23    PT Start Time 1145    PT Stop Time 1230    PT Time Calculation (min) 45 min    Activity Tolerance Patient tolerated treatment well    Behavior During Therapy [facility] for tasks assessed/performed                   Past Medical History:  Diagnosis Date   Acute blood loss anemia 10/05/2020   Acute respiratory failure (HCC), hypothermia therapy, vent - extubated 10/06/20    AKI (acute kidney injury) (HCC) 10/05/2020   Anoxic brain injury (HCC) 10/26/2020   Arrhythmia    Ascending aortic aneurysm (HCC) 01/29/2018   a. 2019 4.3 cm by echo; b. 02/2021 Echo: Ao root 40mm.   Benign brain tumor (HCC)    Benign neoplasm of brain (HCC) 12/19/2017   Cardiac arrest with ventricular fibrillation (HCC)    Chest pain 12/04/2017   Chronic kidney disease, stage III (moderate) (HCC) 12/19/2017   CKD (chronic kidney disease), stage III - IV (HCC)    Conductive hearing loss of both ears 08/24/2018   Coronary Artery Disease    a. 09/2020 Inf STEMI complicated by cardiac arrest/PCI: LAD 50p/m, LCX 100d (2.5 x 30 Resolute Onyx DES); b. 04/2021 PCI: 04/2021 LM nl, LAD 50p/45m (2.5x30 Onyx Frontier DES), LCX 25p, 67m (RFR 0.98), 40d ISR (RFR 0.98), OM2 25, RCA mild diff dzs.   Diabetes mellitus, type 2 (HCC) 10/06/2020   10/06/20 A1C 7%   Dizziness 10/11/2019   Encounter for imaging study to confirm orogastric (OG) tube placement    Eustachian tube dysfunction 08/24/2018   Hematuria 10/26/2020   HFrEF (heart failure with reduced ejection fraction) (HCC)    a. 11/2020 Echo: EF 25-30%; b. 02/2021 Echo: EF 25-30%, glob HK. Mild LVH. Nl RV fxn. Triv AI. Ao root 40mm.   History of pulmonary embolus (PE)    HLD (hyperlipidemia)  10/26/2020   Hypertension    Iron deficiency anemia rec'd IV iron 10/26/2020   Ischemic cardiomyopathy    a. 11/2020 Echo: EF 25-30%; b. 02/2021 Echo: EF 25-30%, glob HK.   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 Marland KitchenSpirometry 03/08/2018    FEV1 3.86 (108%)  Ratio 96 s prior rx  - PET  03/13/18   Low grade c/w adenoca > rec T surgery eval     STEMI (ST elevation myocardial infarction) (HCC) 10/04/2020   Urinary retention 10/26/2020   Vestibular schwannoma (HCC) 08/14/2020   Past Surgical History:  Procedure Laterality Date   APPENDECTOMY  BIV ICD INSERTION CRT-D N/A 11/03/2021   Procedure: BIV ICD INSERTION CRT-D;  Surgeon: Regan Lemming, MD;  [facility] INVAS[facility] CV LAB;  Service: Cardiovascular;  Laterality: N/A;   CARDIAC CATHETERIZATION     CORONARY ANGIOPLASTY WITH STENT PLACEMENT Left 04/12/2021   LAD stent placement   CORONARY PRESSURE/FFR STUDY N/A 04/12/2021   Procedure: INTRAVASCULAR PRESSURE WIRE/FFR STUDY;  Surgeon: Yvonne Kendall, MD;  Location: [facility] INVASIVE CV LAB;  Service: Cardiovascular;  Laterality: N/A;   CORONARY STENT INTERVENTION N/A 04/12/2021   Procedure: CORONARY STENT INTERVENTION;  Surgeon: Yvonne Kendall, MD;  Location: [facility] INVASIVE CV LAB;  Service: Cardiovascular;  Laterality: N/A;   CORONARY ULTRASOUND/IVUS N/A 04/12/2021    Procedure: Intravascular Ultrasound/IVUS;  Surgeon: Yvonne Kendall, MD;  Location: [facility] INVASIVE CV LAB;  Service: Cardiovascular;  Laterality: N/A;   CORONARY/GRAFT ACUTE MI REVASCULARIZATION N/A 10/04/2020   Procedure: Coronary/Graft Acute MI Revascularization;  Surgeon: Yvonne Kendall, MD;  Location: [facility] INVASIVE CV LAB;  Service: Cardiovascular;  Laterality: N/A;   HERNIA REPAIR     LEFT HEART CATH AND CORONARY ANGIOGRAPHY N/A 10/04/2020   Procedure: LEFT HEART CATH AND CORONARY ANGIOGRAPHY;  Surgeon: Yvonne Kendall, MD;  Location: [facility] INVASIVE CV LAB;  Service: Cardiovascular;  Laterality: N/A;   RIGHT/LEFT HEART CATH AND CORONARY ANGIOGRAPHY N/A 04/12/2021   Procedure: RIGHT/LEFT HEART CATH AND CORONARY ANGIOGRAPHY;  Surgeon: Yvonne Kendall, MD;  Location: [facility] INVASIVE CV LAB;  Service: Cardiovascular;  Laterality: N/A;   Patient Active Problem List   Diagnosis Date Noted   Anemia    Leukocytosis    Right arm weakness 01/25/2022   Right sided weakness    Fall at home 01/22/2022   Status post biventricular pacemaker - MRI compatible PPM and leads 01/22/2022   Chronic indwelling Foley catheter 01/22/2022   CKD (chronic kidney disease), stage III - IV (HCC) 06/28/2021   Chronic HFrEF (heart failure with reduced ejection fraction) (HCC) 03/31/2021   Arrhythmia    DM (diabetes mellitus), type 2 (HCC) new 10/26/2020   Urinary retention 10/26/2020   Hyperlipidemia LDL goal <70 10/26/2020   Coronary artery disease of native artery of native heart with stable angina pectoris (HCC) 10/26/2020   S/P angioplasty with stent 10/04/20 DES to LCX  10/26/2020   Ischemic cardiomyopathy 10/26/2020   Diabetes mellitus, type 2 (HCC) 10/06/2020   DOE (dyspnea on exertion)    Meningioma (HCC) 08/14/2020   Vestibular schwannoma (HCC) 08/14/2020   Cardiac arrest with ventricular fibrillation (HCC)    Pain of left hip joint 05/29/2020   Benign brain tumor (HCC)    Chronic kidney disease    History of  pulmonary embolus (PE)    Hypertension    Nonsustained ventricular tachycardia (HCC) 10/11/2019   Dizziness 10/11/2019   Abnormal echocardiogram 06/07/2019   Eustachian tube dysfunction 08/24/2018   Conductive hearing loss of both ears 08/24/2018   Pulmonary hypertension due to thromboembolism (HCC) 03/09/2018   Solitary pulmonary nodule on lung CT 03/08/2018   Ascending aortic aneurysm (HCC) 4.2 cm based on CT from December 2020 01/29/2018   Sinus bradycardia 12/22/2017   Left bundle branch block 12/19/2017   Personal history of pulmonary embolism - no longer on anticoagulation due to pt refusal to take it anymore. 12/19/2017   Chronic kidney disease, stage III (moderate) (HCC) 12/19/2017   Benign neoplasm of brain (HCC) 12/19/2017    PCP: Gweneth Dimitri, MD  REFERRING PROVIDER: Gweneth Dimitri, MD  REFERRING DIAG: Free Text Diagnosis R29.6, R26.89, R26.9 Mult. Falls,  Poor Balance, Gait Disturbance  THERAPY DIAG:  Unsteadiness on feet  Other lack of coordination  Muscle weakness (generalized)  Difficulty in walking, not elsewhere classified  Right sided weakness  Repeated falls  Rationale for Evaluation and Treatment: Rehabilitation  ONSET DATE: chronic   SUBJECTIVE:   SUBJECTIVE STATEMENT:  "Not bad" MD appointment his morning that worked out ok   EVAL: Having issues with balance and walking. I do have a meningioma in my spine, and every once in awhile I have pain if I twist or stress it. Still having pain in my neck and hip from a fall that I had about a year and a half ago. Thought that pain would go away but it hasn't even this far out/being this healed. I have a lot of unsteadiness, not sure I'd call it dizziness but I have to stand for a minute before I get going. Have a couple of walking sticks/canes but not sure how to use them honestly. Entire R LE and UE are  still numb. When I fell I was at a friend's house and were leaving, about an hour before that I had a  glass of wine, I went out first and held the door for the next people but didn't realize that the concrete platform ended and dropped about 15 inches and ended up stepping off that 15 inch ledge and hit my head/shoulder/hip/knee on the wall beyond that. Meningioma is still just being monitored, saw the MD and they said its not growing but its just there in the spine. Every single movement I make with my neck hurts when I get to the end of that range.   PERTINENT HISTORY: See above- complex medical hx  PAIN:  Are you having pain? No "Not abnormal"   PRECAUTIONS: Fall and Other: caution with movements, some movements can cause increased pressure/pain at meningioma site, ICD    RED FLAGS: None   WEIGHT BEARING RESTRICTIONS: No  FALLS:  Has patient fallen in last 6 months? Yes. Number of falls 1- had a mis-step coming off a ladder, fell backwards and hit the floor pretty hard  LIVING ENVIRONMENT: Lives with: lives with their spouse Lives in: House/apartment Stairs:  5 STE with B rails, has second floor in his home but holds rail    OCCUPATION: retired   PLOF: Independent, Independent with basic ADLs, Independent with gait, and Independent with transfers  PATIENT GOALS: getting more active, improving balance   NEXT MD VISIT: Referring scheduled but unsure when   OBJECTIVE:  Note: Objective measures were completed at Evaluation unless otherwise noted.    PATIENT SURVEYS:  ABC scale 65.6%  COGNITION: Overall cognitive status: Within functional limits for tasks assessed     SENSATION: Complete R UE and R LE numbness (chronic)    COORDINATION  Noted some ataxia in R LE with functional movements   LOWER EXTREMITY MMT:  MMT Right eval Left eval  Hip flexion 4+ 5  Hip extension    Hip abduction 5 5  Hip adduction    Hip internal rotation    Hip external rotation    Knee flexion 5 5  Knee extension 5 5  Ankle dorsiflexion 5 5  Ankle plantarflexion    Ankle  inversion    Ankle eversion     (Blank rows = not tested)   FUNCTIONAL TESTS:  5 times sit to stand: 15.4 seconds use of UEs, some difficulty stabilizing; 11/20/23 15.2 seconds use of UEs  Timed up and  go (TUG): 14.2 seconds no device but did need MinA for balance when initially standing and then turning to sit in the chair at end of test; 11/20/23 10 seconds  3 minute walk test: 652ft no device but needed MinA at times for balance on corners, RPE 4/10  GAIT: Distance walked: 626ft Assistive device utilized: None Level of assistance: Min A Comments: MinA for balance with turns and when navigating obstacles, some ataxic movements noted                                                                                                                                 TREATMENT DATE:  12/07/23 NuStep L5 x 6 min HS curls 25lb 2x10 Leg Ext 10lb 2x10 Alt 4in box taps 2x10 On airex reaching outside base of support Ball toss 2x10 Side steps at mat table Standing march  Shoulder Ext 10lb 2x10   12/04/23  Nustep all four extremities L6x5 minutes, then L5 for an additional 5 minutes    Wide tandem stance blue foam pad 3x30 seconds B  Forward step downs from blue foam pad in // bars light BUE support x10 B Lateral step downs from blue foam pad in // bars light BUE support x10 B, one time MinA for balance recovery  Backwards step downs from blue foam pad in // bars light BUE support x10 B Forward/backward stepping off blue foam pad (trying to clear pad with swing LE) x5 B close min guard  Walking on long blue foam planks very close min guard in // bars x3 laps   11/30/23   Wide tandem stance blue foam pad 3x30 seconds B  Forward toe taps to target while standing on blue foam pad x20 minA  Stepping over half foam rolls + alternating toe taps to targets solid surface x3 rounds // bars  Nustep L6x2.5 minutes then 7.5 minutes L5 BLEs only  Figure 8 walks x2 each direction Backwards  walking in bars solid surface min guard x5 laps Standing one foot on soft surface of BOSU/other on floor 3x20 seconds B       11/27/23  Alternating forward toe taps to targets on 4 inch steps x20 Lateral 4 inch step taps x10 B  Double 4 inch step taps x10 B Cross midline double 4 inch step taps x5 B up to ModA  Side steps in // bars x5 rounds solid surface S 90* turns CW, then CCW, then in randomized directions up to ModA  Randomized perturbations on solid surface (tried blue foam pad but not ready for perturbations yet)   Nustep L6x5 + L5x5 minutes all four extremities for functional activity tolerance and strength       11/23/23  Narrow BOS solid surface 3x30 seconds  Forward step overs half foam roll up to ModA for balance, difficulty with R LE motor control when stepping with L LE Lateral step overs half foam roll up to MinA for  balance, not as challenging as forward steps Alternating toe taps to air targets x20 min guard  Standing with one LE on small air pad and other on solid surface 3x30 seconds B up to MinA for balance  Cross midline target taps solid surface x10 B up to ModA for balance  Nustep L6x5 minutes all four extremities for promotion of reciprocal motion TUG and 5xSTS checks for STG update         PATIENT EDUCATION:  Education details: exam findings, POC Person educated: Patient Education method: Explanation, Demonstration, and Verbal cues Education comprehension: verbalized understanding, returned demonstration, and needs further education  HOME EXERCISE PROGRAM:  Access Code: [email or web address] URL: [email or web address] Date: 11/20/2023 Prepared by: Nedra Hai  Exercises - Wide Tandem Stance with Eyes Open  - 1 x daily - 7 x weekly - 2 sets - 3 reps - 30 seconds  hold - Romberg Stance  - 1 x daily - 7 x weekly - 2 sets - 3 reps - 30 second  hold - Single Leg Stance with Support  - 1 x daily - 7 x weekly - 2 sets - 3  reps    ASSESSMENT:  CLINICAL IMPRESSION:  Pt arrived today doing well, we continued working on functional activity tolerance and balance progressions as tolerated. Work on noncompliant surfaces reaching outside BOS. Static standing on airex did pose some issues at times. Some posteriot LOB noted with marching moe so when lifting RLE. Will continue to progress,  demonstrating good balance reactions with ankle strategies      EVAL: Patient is a 78 y.o. M who was seen today for physical therapy evaluation and treatment for Free Text Diagnosis R29.6, R26.89, R26.9 Mult. Falls, Poor Balance, Gait Disturbance. Very pleasant and cooperative but very talkative during eval which did limit assessment time. Still having a lot of pain in general since his fall  and ongoing numbness in R UE/LE. Gait mechanics and balance are definitely impaired, would benefit from training with appropriate AD if he is open to this. Will benefit from skilled PT services to address his concerns and functional objective impairments to the best of our ability.   OBJECTIVE IMPAIRMENTS: Abnormal gait, cardiopulmonary status limiting activity, decreased activity tolerance, decreased balance, decreased mobility, and difficulty walking.   ACTIVITY LIMITATIONS: standing, squatting, stairs, transfers, reach over head, and locomotion level  PARTICIPATION LIMITATIONS: driving, shopping, community activity, and occupation  PERSONAL FACTORS: Age, Behavior pattern, Fitness, Past/current experiences, Social background, and Time since onset of injury/illness/exacerbation are also affecting patient's functional outcome.   REHAB POTENTIAL: Good  CLINICAL DECISION MAKING: Evolving/moderate complexity  EVALUATION COMPLEXITY: Moderate   GOALS: Goals reviewed with patient? No  SHORT TERM GOALS: Target date: 11/23/2023   Will be compliant with appropriate progressive HEP  Baseline: Goal status: MET 11/23/23  2.  Will be able to name  3 ways to prevent falls at home and in the community  Baseline:  Goal status: MET 11/23/23  3.  Will be able to perform 5xSTS and with distant S Baseline: MinA  Goal status: ONGOING 11/23/23  4.  Will complete TUG test in 12 seconds or less S  Baseline: MinA  Goal status: MET 11/20/23    LONG TERM GOALS: Target date: 12/21/2023    Will score at least 18 on DGI to show improved functional balance  Baseline:  Goal status: INITIAL  2.  Will be able to ambulate community distances (level and inclines) no device, RPE no more than 2/10  Baseline:  Goal status: INITIAL  3.  Will be able to perform all desired gym activities without increased pain from resting levels  Baseline:  Goal status: INITIAL  4.  ABC score to improve by at least 20 points to show improved subjective status  Baseline:  Goal status: INITIAL     PLAN:  PT FREQUENCY: 2x/week  PT DURATION: 8 weeks  PLANNED INTERVENTIONS: 97164- PT Re-evaluation, 97110-Therapeutic exercises, 97530- Therapeutic activity, 97112- Neuromuscular re-education, 97535- Self Care, [id number]- Manual therapy, [phone]- Gait training, [phone]- Aquatic Therapy, and Balance training  PLAN FOR NEXT SESSION: balance and coordination progressions with compliant surfaces , functional activity tolerance, work on some quad strength and general LE endurance   Debroah Baller, PTA 12/07/23 11:47 AM

## 2023-12-12 ENCOUNTER — Ambulatory Visit: Attending: Family Medicine | Admitting: Physical Therapy

## 2023-12-12 ENCOUNTER — Encounter: Payer: Self-pay | Admitting: Physical Therapy

## 2023-12-12 DIAGNOSIS — R278 Other lack of coordination: Secondary | ICD-10-CM

## 2023-12-12 DIAGNOSIS — R262 Difficulty in walking, not elsewhere classified: Secondary | ICD-10-CM | POA: Diagnosis not present

## 2023-12-12 DIAGNOSIS — R296 Repeated falls: Secondary | ICD-10-CM | POA: Diagnosis not present

## 2023-12-12 DIAGNOSIS — R2681 Unsteadiness on feet: Secondary | ICD-10-CM

## 2023-12-12 DIAGNOSIS — R531 Weakness: Secondary | ICD-10-CM | POA: Insufficient documentation

## 2023-12-12 DIAGNOSIS — M6281 Muscle weakness (generalized): Secondary | ICD-10-CM

## 2023-12-12 NOTE — Therapy (Signed)
 OUTPATIENT PHYSICAL THERAPY LOWER EXTREMITY TREATMENT    Patient Name: Timothy Moran MRN: [id number] DOB:[birth year], 78 y.o., male Today's Date: 12/12/2023  END OF SESSION:  PT End of Session - 12/12/23 1429     Visit Number 9    Number of Visits 17    Date for PT Re-Evaluation 12/21/23    Authorization Type MCR    Progress Note Due on Visit 10    PT Start Time 1431    PT Stop Time 1511    PT Time Calculation (min) 40 min    Activity Tolerance Patient tolerated treatment well    Behavior During Therapy [facility] for tasks assessed/performed                    Past Medical History:  Diagnosis Date   Acute blood loss anemia 10/05/2020   Acute respiratory failure (HCC), hypothermia therapy, vent - extubated 10/06/20    AKI (acute kidney injury) (HCC) 10/05/2020   Anoxic brain injury (HCC) 10/26/2020   Arrhythmia    Ascending aortic aneurysm (HCC) 01/29/2018   a. 2019 4.3 cm by echo; b. 02/2021 Echo: Ao root 40mm.   Benign brain tumor (HCC)    Benign neoplasm of brain (HCC) 12/19/2017   Cardiac arrest with ventricular fibrillation (HCC)    Chest pain 12/04/2017   Chronic kidney disease, stage III (moderate) (HCC) 12/19/2017   CKD (chronic kidney disease), stage III - IV (HCC)    Conductive hearing loss of both ears 08/24/2018   Coronary Artery Disease    a. 09/2020 Inf STEMI complicated by cardiac arrest/PCI: LAD 50p/m, LCX 100d (2.5 x 30 Resolute Onyx DES); b. 04/2021 PCI: 04/2021 LM nl, LAD 50p/22m (2.5x30 Onyx Frontier DES), LCX 25p, 6m (RFR 0.98), 40d ISR (RFR 0.98), OM2 25, RCA mild diff dzs.   Diabetes mellitus, type 2 (HCC) 10/06/2020   10/06/20 A1C 7%   Dizziness 10/11/2019   Encounter for imaging study to confirm orogastric (OG) tube placement    Eustachian tube dysfunction 08/24/2018   Hematuria 10/26/2020   HFrEF (heart failure with reduced ejection fraction) (HCC)    a. 11/2020 Echo: EF 25-30%; b. 02/2021 Echo: EF 25-30%, glob HK. Mild LVH. Nl RV fxn. Triv AI.  Ao root 40mm.   History of pulmonary embolus (PE)    HLD (hyperlipidemia) 10/26/2020   Hypertension    Iron deficiency anemia rec'd IV iron 10/26/2020   Ischemic cardiomyopathy    a. 11/2020 Echo: EF 25-30%; b. 02/2021 Echo: EF 25-30%, glob HK.   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 Marland KitchenSpirometry 03/08/2018    FEV1 3.86 (108%)  Ratio 96 s prior rx  - PET  03/13/18   Low grade c/w adenoca > rec T surgery eval     STEMI (ST elevation myocardial infarction) (HCC) 10/04/2020  Urinary retention 10/26/2020   Vestibular schwannoma (HCC) 08/14/2020   Past Surgical History:  Procedure Laterality Date   APPENDECTOMY     BIV ICD INSERTION CRT-D N/A 11/03/2021   Procedure: BIV ICD INSERTION CRT-D;  Surgeon: Regan Lemming, MD;  Location: [facility] INVASIVE CV LAB;  Service: Cardiovascular;  Laterality: N/A;   CARDIAC CATHETERIZATION     CORONARY ANGIOPLASTY WITH STENT PLACEMENT Left 04/12/2021   LAD stent placement   CORONARY PRESSURE/FFR STUDY N/A 04/12/2021   Procedure: INTRAVASCULAR PRESSURE WIRE/FFR STUDY;  Surgeon: Yvonne Kendall, MD;  Location: [facility] INVASIVE CV LAB;  Service: Cardiovascular;  Laterality: N/A;   CORONARY STENT INTERVENTION N/A 04/12/2021   Procedure: CORONARY STENT INTERVENTION;  Surgeon: Yvonne Kendall, MD;  Location: [facility] INVASIVE CV LAB;  Service:  Cardiovascular;  Laterality: N/A;   CORONARY ULTRASOUND/IVUS N/A 04/12/2021   Procedure: Intravascular Ultrasound/IVUS;  Surgeon: Yvonne Kendall, MD;  Location: [facility] INVASIVE CV LAB;  Service: Cardiovascular;  Laterality: N/A;   CORONARY/GRAFT ACUTE MI REVASCULARIZATION N/A 10/04/2020   Procedure: Coronary/Graft Acute MI Revascularization;  Surgeon: Yvonne Kendall, MD;  Location: [facility] INVASIVE CV LAB;  Service: Cardiovascular;  Laterality: N/A;   HERNIA REPAIR     LEFT HEART CATH AND CORONARY ANGIOGRAPHY N/A 10/04/2020   Procedure: LEFT HEART CATH AND CORONARY ANGIOGRAPHY;  Surgeon: Yvonne Kendall, MD;  Location: [facility] INVASIVE CV LAB;  Service: Cardiovascular;  Laterality: N/A;   RIGHT/LEFT HEART CATH AND CORONARY ANGIOGRAPHY N/A 04/12/2021   Procedure: RIGHT/LEFT HEART CATH AND CORONARY ANGIOGRAPHY;  Surgeon: Yvonne Kendall, MD;  Location: [facility] INVASIVE CV LAB;  Service: Cardiovascular;  Laterality: N/A;   Patient Active Problem List   Diagnosis Date Noted   Anemia    Leukocytosis    Right arm weakness 01/25/2022   Right sided weakness    Fall at home 01/22/2022   Status post biventricular pacemaker - MRI compatible PPM and leads 01/22/2022   Chronic indwelling Foley catheter 01/22/2022   CKD (chronic kidney disease), stage III - IV (HCC) 06/28/2021   Chronic HFrEF (heart failure with reduced ejection fraction) (HCC) 03/31/2021   Arrhythmia    DM (diabetes mellitus), type 2 (HCC) new 10/26/2020   Urinary retention 10/26/2020   Hyperlipidemia LDL goal <70 10/26/2020   Coronary artery disease of native artery of native heart with stable angina pectoris (HCC) 10/26/2020   S/P angioplasty with stent 10/04/20 DES to LCX  10/26/2020   Ischemic cardiomyopathy 10/26/2020   Diabetes mellitus, type 2 (HCC) 10/06/2020   DOE (dyspnea on exertion)    Meningioma (HCC) 08/14/2020   Vestibular schwannoma (HCC) 08/14/2020   Cardiac arrest with ventricular fibrillation (HCC)    Pain of left hip joint  05/29/2020   Benign brain tumor (HCC)    Chronic kidney disease    History of pulmonary embolus (PE)    Hypertension    Nonsustained ventricular tachycardia (HCC) 10/11/2019   Dizziness 10/11/2019   Abnormal echocardiogram 06/07/2019   Eustachian tube dysfunction 08/24/2018   Conductive hearing loss of both ears 08/24/2018   Pulmonary hypertension due to thromboembolism (HCC) 03/09/2018   Solitary pulmonary nodule on lung CT 03/08/2018   Ascending aortic aneurysm (HCC) 4.2 cm based on CT from December 2020 01/29/2018   Sinus bradycardia 12/22/2017   Left bundle branch block 12/19/2017   Personal history of pulmonary embolism - no longer on anticoagulation due to pt refusal to take it anymore. 12/19/2017   Chronic kidney disease, stage III (moderate) (HCC) 12/19/2017   Benign neoplasm of brain (HCC)  12/19/2017    PCP: Gweneth Dimitri, MD  REFERRING PROVIDER: Gweneth Dimitri, MD  REFERRING DIAG: Free Text Diagnosis R29.6, R26.89, R26.9 Mult. Falls, Poor Balance, Gait Disturbance  THERAPY DIAG:  Unsteadiness on feet  Other lack of coordination  Muscle weakness (generalized)  Difficulty in walking, not elsewhere classified  Rationale for Evaluation and Treatment: Rehabilitation  ONSET DATE: chronic   SUBJECTIVE:   SUBJECTIVE STATEMENT:  Things are about the same, Ron worked me last time it was a little more physical and I could feel it afterwards. I still have stability issues.    EVAL: Having issues with balance and walking. I do have a meningioma in my spine, and every once in awhile I have pain if I twist or stress it. Still having pain in my neck and hip from a fall that I had about a year and a half ago. Thought that pain would go away but it hasn't even this far out/being this healed. I have a lot of unsteadiness, not sure I'd call it dizziness but I have to stand for a minute before I get going. Have a couple of walking sticks/canes but not sure how to use them  honestly. Entire R LE and UE are  still numb. When I fell I was at a friend's house and were leaving, about an hour before that I had a glass of wine, I went out first and held the door for the next people but didn't realize that the concrete platform ended and dropped about 15 inches and ended up stepping off that 15 inch ledge and hit my head/shoulder/hip/knee on the wall beyond that. Meningioma is still just being monitored, saw the MD and they said its not growing but its just there in the spine. Every single movement I make with my neck hurts when I get to the end of that range.   PERTINENT HISTORY: See above- complex medical hx  PAIN:  Are you having pain? No "Not bad, I have noticed back pain is a little worse, will call the people that have treated that and let them know, no pain at rest but if I sneeze it hurts like crazy. Can feel it when I bend or twist my back".   PRECAUTIONS: Fall and Other: caution with movements, some movements can cause increased pressure/pain at meningioma site, ICD    RED FLAGS: None   WEIGHT BEARING RESTRICTIONS: No  FALLS:  Has patient fallen in last 6 months? Yes. Number of falls 1- had a mis-step coming off a ladder, fell backwards and hit the floor pretty hard  LIVING ENVIRONMENT: Lives with: lives with their spouse Lives in: House/apartment Stairs:  5 STE with B rails, has second floor in his home but holds rail    OCCUPATION: retired   PLOF: Independent, Independent with basic ADLs, Independent with gait, and Independent with transfers  PATIENT GOALS: getting more active, improving balance   NEXT MD VISIT: Referring scheduled but unsure when   OBJECTIVE:  Note: Objective measures were completed at Evaluation unless otherwise noted.    PATIENT SURVEYS:  ABC scale 65.6%  COGNITION: Overall cognitive status: Within functional limits for tasks assessed     SENSATION: Complete R UE and R LE numbness (chronic)    COORDINATION  Noted  some ataxia in R LE with functional movements   LOWER EXTREMITY MMT:  MMT Right eval Left eval  Hip flexion 4+ 5  Hip extension    Hip abduction 5 5  Hip adduction  Hip internal rotation    Hip external rotation    Knee flexion 5 5  Knee extension 5 5  Ankle dorsiflexion 5 5  Ankle plantarflexion    Ankle inversion    Ankle eversion     (Blank rows = not tested)   FUNCTIONAL TESTS:  5 times sit to stand: 15.4 seconds use of UEs, some difficulty stabilizing; 11/20/23 15.2 seconds use of UEs  Timed up and go (TUG): 14.2 seconds no device but did need MinA for balance when initially standing and then turning to sit in the chair at end of test; 11/20/23 10 seconds  3 minute walk test: 621ft no device but needed MinA at times for balance on corners, RPE 4/10  GAIT: Distance walked: 650ft Assistive device utilized: None Level of assistance: Min A Comments: MinA for balance with turns and when navigating obstacles, some ataxic movements noted                                                                                                                                 TREATMENT DATE:    12/12/23  Nustep L5 x10 minutes BLEs only for LE endurance, functional activity tolerance  Wide tandem stance one foot on blue foam/one foot on air pad 3x30 seconds each  Clearance up and over double cone x10 B, up to MinA for balance  SLS with one foot on blue wted ball 3x30 seconds B up to MinA for balance  Forward/backward ball rolls x2 rounds each side, light BUE support, up to MinA for balance  Sideways marches x4 laps in // bars up to light MinA          12/07/23 NuStep L5 x 6 min HS curls 25lb 2x10 Leg Ext 10lb 2x10 Alt 4in box taps 2x10 On airex reaching outside base of support Ball toss 2x10 Side steps at mat table Standing march  Shoulder Ext 10lb 2x10   12/04/23  Nustep all four extremities L6x5 minutes, then L5 for an additional 5 minutes    Wide tandem  stance blue foam pad 3x30 seconds B  Forward step downs from blue foam pad in // bars light BUE support x10 B Lateral step downs from blue foam pad in // bars light BUE support x10 B, one time MinA for balance recovery  Backwards step downs from blue foam pad in // bars light BUE support x10 B Forward/backward stepping off blue foam pad (trying to clear pad with swing LE) x5 B close min guard  Walking on long blue foam planks very close min guard in // bars x3 laps   11/30/23   Wide tandem stance blue foam pad 3x30 seconds B  Forward toe taps to target while standing on blue foam pad x20 minA  Stepping over half foam rolls + alternating toe taps to targets solid surface x3 rounds // bars  Nustep L6x2.5 minutes then 7.5 minutes L5 BLEs only  Figure  8 walks x2 each direction Backwards walking in bars solid surface min guard x5 laps Standing one foot on soft surface of BOSU/other on floor 3x20 seconds B       PATIENT EDUCATION:  Education details: exam findings, POC Person educated: Patient Education method: Explanation, Demonstration, and Verbal cues Education comprehension: verbalized understanding, returned demonstration, and needs further education  HOME EXERCISE PROGRAM:  Access Code: [email or web address] URL: [email or web address] Date: 11/20/2023 Prepared by: Nedra Hai  Exercises - Wide Tandem Stance with Eyes Open  - 1 x daily - 7 x weekly - 2 sets - 3 reps - 30 seconds  hold - Romberg Stance  - 1 x daily - 7 x weekly - 2 sets - 3 reps - 30 second  hold - Single Leg Stance with Support  - 1 x daily - 7 x weekly - 2 sets - 3 reps    ASSESSMENT:  CLINICAL IMPRESSION:  Pt arrives today doing OK, we continued balance focus and progressions as steadiness and stability remains his main concern. He will be due for 10th visit progress note next session.     EVAL: Patient is a 78 y.o. M who was seen today for physical therapy evaluation and treatment for Free Text  Diagnosis R29.6, R26.89, R26.9 Mult. Falls, Poor Balance, Gait Disturbance. Very pleasant and cooperative but very talkative during eval which did limit assessment time. Still having a lot of pain in general since his fall  and ongoing numbness in R UE/LE. Gait mechanics and balance are definitely impaired, would benefit from training with appropriate AD if he is open to this. Will benefit from skilled PT services to address his concerns and functional objective impairments to the best of our ability.   OBJECTIVE IMPAIRMENTS: Abnormal gait, cardiopulmonary status limiting activity, decreased activity tolerance, decreased balance, decreased mobility, and difficulty walking.   ACTIVITY LIMITATIONS: standing, squatting, stairs, transfers, reach over head, and locomotion level  PARTICIPATION LIMITATIONS: driving, shopping, community activity, and occupation  PERSONAL FACTORS: Age, Behavior pattern, Fitness, Past/current experiences, Social background, and Time since onset of injury/illness/exacerbation are also affecting patient's functional outcome.   REHAB POTENTIAL: Good  CLINICAL DECISION MAKING: Evolving/moderate complexity  EVALUATION COMPLEXITY: Moderate   GOALS: Goals reviewed with patient? No  SHORT TERM GOALS: Target date: 11/23/2023   Will be compliant with appropriate progressive HEP  Baseline: Goal status: MET 11/23/23  2.  Will be able to name 3 ways to prevent falls at home and in the community  Baseline:  Goal status: MET 11/23/23  3.  Will be able to perform 5xSTS and with distant S Baseline: MinA  Goal status: ONGOING 11/23/23  4.  Will complete TUG test in 12 seconds or less S  Baseline: MinA  Goal status: MET 11/20/23    LONG TERM GOALS: Target date: 12/21/2023    Will score at least 18 on DGI to show improved functional balance  Baseline:  Goal status: INITIAL  2.  Will be able to ambulate community distances (level and inclines) no device, RPE no  more than 2/10  Baseline:  Goal status: INITIAL  3.  Will be able to perform all desired gym activities without increased pain from resting levels  Baseline:  Goal status: INITIAL  4.  ABC score to improve by at least 20 points to show improved subjective status  Baseline:  Goal status: INITIAL     PLAN:  PT FREQUENCY: 2x/week  PT DURATION: 8 weeks  PLANNED INTERVENTIONS: 54098- PT Re-evaluation, 97110-Therapeutic  exercises, 97530- Therapeutic activity, [id number]- Neuromuscular re-education, [phone]- Self Care, 29528- Manual therapy, [id number]- Gait training, [phone]- Aquatic Therapy, and Balance training  PLAN FOR NEXT SESSION: balance and coordination progressions with compliant surfaces , functional activity tolerance, work on some quad strength and general LE endurance. 10th visit progress note next time   Nedra Hai, PT, DPT 12/12/23 3:11 PM

## 2023-12-13 ENCOUNTER — Ambulatory Visit: Admitting: Physical Therapy

## 2023-12-13 ENCOUNTER — Encounter: Payer: Self-pay | Admitting: Physical Therapy

## 2023-12-13 DIAGNOSIS — R2681 Unsteadiness on feet: Secondary | ICD-10-CM | POA: Diagnosis not present

## 2023-12-13 DIAGNOSIS — R262 Difficulty in walking, not elsewhere classified: Secondary | ICD-10-CM | POA: Diagnosis not present

## 2023-12-13 DIAGNOSIS — R531 Weakness: Secondary | ICD-10-CM | POA: Diagnosis not present

## 2023-12-13 DIAGNOSIS — M6281 Muscle weakness (generalized): Secondary | ICD-10-CM | POA: Diagnosis not present

## 2023-12-13 DIAGNOSIS — R296 Repeated falls: Secondary | ICD-10-CM | POA: Diagnosis not present

## 2023-12-13 DIAGNOSIS — R278 Other lack of coordination: Secondary | ICD-10-CM

## 2023-12-13 NOTE — Therapy (Signed)
 OUTPATIENT PHYSICAL THERAPY LOWER EXTREMITY TREATMENT/PROGRESS NOTE    Patient Name: Timothy Moran MRN: [id number] DOB:[birth year], 78 y.o., male Today's Date: 12/13/2023   Progress Note Reporting Period 10/26/23 to 12/13/23  See note below for Objective Data and Assessment of Progress/Goals.      END OF SESSION:  PT End of Session - 12/13/23 1355     Visit Number 10    Number of Visits 17    Date for PT Re-Evaluation 01/10/24    Authorization Type MCR    Progress Note Due on Visit 20    PT Start Time 1144    PT Stop Time 1225    PT Time Calculation (min) 41 min    Activity Tolerance Patient tolerated treatment well    Behavior During Therapy [facility] for tasks assessed/performed                     Past Medical History:  Diagnosis Date   Acute blood loss anemia 10/05/2020   Acute respiratory failure (HCC), hypothermia therapy, vent - extubated 10/06/20    AKI (acute kidney injury) (HCC) 10/05/2020   Anoxic brain injury (HCC) 10/26/2020   Arrhythmia    Ascending aortic aneurysm (HCC) 01/29/2018   a. 2019 4.3 cm by echo; b. 02/2021 Echo: Ao root 40mm.   Benign brain tumor (HCC)    Benign neoplasm of brain (HCC) 12/19/2017   Cardiac arrest with ventricular fibrillation (HCC)    Chest pain 12/04/2017   Chronic kidney disease, stage III (moderate) (HCC) 12/19/2017   CKD (chronic kidney disease), stage III - IV (HCC)    Conductive hearing loss of both ears 08/24/2018   Coronary Artery Disease    a. 09/2020 Inf STEMI complicated by cardiac arrest/PCI: LAD 50p/m, LCX 100d (2.5 x 30 Resolute Onyx DES); b. 04/2021 PCI: 04/2021 LM nl, LAD 50p/39m (2.5x30 Onyx Frontier DES), LCX 25p, 48m (RFR 0.98), 40d ISR (RFR 0.98), OM2 25, RCA mild diff dzs.   Diabetes mellitus, type 2 (HCC) 10/06/2020   10/06/20 A1C 7%   Dizziness 10/11/2019   Encounter for imaging study to confirm orogastric (OG) tube placement    Eustachian tube dysfunction 08/24/2018   Hematuria 10/26/2020    HFrEF (heart failure with reduced ejection fraction) (HCC)    a. 11/2020 Echo: EF 25-30%; b. 02/2021 Echo: EF 25-30%, glob HK. Mild LVH. Nl RV fxn. Triv AI. Ao root 40mm.   History of pulmonary embolus (PE)    HLD (hyperlipidemia) 10/26/2020   Hypertension    Iron deficiency anemia rec'd IV iron 10/26/2020   Ischemic cardiomyopathy    a. 11/2020 Echo: EF 25-30%; b. 02/2021 Echo: EF 25-30%, glob HK.   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 Marland KitchenSpirometry 03/08/2018    FEV1 3.86 (108%)  Ratio 96 s prior rx  -  PET  03/13/18   Low grade c/w adenoca > rec T surgery eval     STEMI (ST elevation myocardial infarction) (HCC) 10/04/2020   Urinary retention 10/26/2020   Vestibular schwannoma (HCC) 08/14/2020   Past Surgical History:  Procedure Laterality Date   APPENDECTOMY     BIV ICD INSERTION CRT-D N/A 11/03/2021   Procedure: BIV ICD INSERTION CRT-D;  Surgeon: Regan Lemming, MD;  Location: [facility] INVASIVE CV LAB;  Service: Cardiovascular;  Laterality: N/A;   CARDIAC CATHETERIZATION     CORONARY ANGIOPLASTY WITH STENT PLACEMENT Left 04/12/2021   LAD stent placement   CORONARY PRESSURE/FFR STUDY N/A 04/12/2021   Procedure: INTRAVASCULAR PRESSURE WIRE/FFR STUDY;  Surgeon: Yvonne Kendall, MD;  Location: [facility] INVASIVE CV LAB;  Service: Cardiovascular;  Laterality: N/A;   CORONARY  STENT INTERVENTION N/A 04/12/2021   Procedure: CORONARY STENT INTERVENTION;  Surgeon: Yvonne Kendall, MD;  Location: [facility] INVASIVE CV LAB;  Service: Cardiovascular;  Laterality: N/A;   CORONARY ULTRASOUND/IVUS N/A 04/12/2021   Procedure: Intravascular Ultrasound/IVUS;  Surgeon: Yvonne Kendall, MD;  Location: [facility] INVASIVE CV LAB;  Service: Cardiovascular;  Laterality: N/A;   CORONARY/GRAFT ACUTE MI REVASCULARIZATION N/A 10/04/2020   Procedure: Coronary/Graft Acute MI Revascularization;  Surgeon: Yvonne Kendall, MD;  Location: [facility] INVASIVE CV LAB;  Service: Cardiovascular;  Laterality: N/A;   HERNIA REPAIR     LEFT HEART CATH AND CORONARY ANGIOGRAPHY N/A 10/04/2020   Procedure: LEFT HEART CATH AND CORONARY ANGIOGRAPHY;  Surgeon: Yvonne Kendall, MD;  Location: [facility] INVASIVE CV LAB;  Service: Cardiovascular;  Laterality: N/A;   RIGHT/LEFT HEART CATH AND CORONARY ANGIOGRAPHY N/A 04/12/2021   Procedure: RIGHT/LEFT HEART CATH AND CORONARY ANGIOGRAPHY;  Surgeon: Yvonne Kendall, MD;  Location: [facility] INVASIVE CV LAB;  Service: Cardiovascular;  Laterality: N/A;   Patient Active Problem List   Diagnosis Date Noted   Anemia    Leukocytosis    Right arm weakness 01/25/2022   Right sided weakness    Fall at home 01/22/2022   Status post biventricular pacemaker - MRI compatible PPM and leads 01/22/2022   Chronic indwelling Foley catheter 01/22/2022   CKD (chronic kidney disease), stage III - IV (HCC) 06/28/2021   Chronic HFrEF (heart failure with reduced ejection fraction) (HCC) 03/31/2021   Arrhythmia    DM (diabetes mellitus), type 2 (HCC) new 10/26/2020   Urinary retention 10/26/2020   Hyperlipidemia LDL goal <70 10/26/2020   Coronary artery disease of native artery of native heart with stable angina pectoris (HCC) 10/26/2020   S/P angioplasty with stent 10/04/20 DES to LCX  10/26/2020   Ischemic cardiomyopathy 10/26/2020   Diabetes mellitus, type 2 (HCC) 10/06/2020   DOE (dyspnea on exertion)     Meningioma (HCC) 08/14/2020   Vestibular schwannoma (HCC) 08/14/2020   Cardiac arrest with ventricular fibrillation (HCC)    Pain of left hip joint 05/29/2020   Benign brain tumor (HCC)    Chronic kidney disease    History of pulmonary embolus (PE)    Hypertension    Nonsustained ventricular tachycardia (HCC) 10/11/2019   Dizziness 10/11/2019   Abnormal echocardiogram 06/07/2019   Eustachian tube dysfunction 08/24/2018   Conductive hearing loss of both ears 08/24/2018   Pulmonary hypertension due to thromboembolism (HCC) 03/09/2018   Solitary pulmonary nodule on lung CT 03/08/2018   Ascending aortic aneurysm (HCC) 4.2 cm based on CT from December 2020 01/29/2018   Sinus bradycardia 12/22/2017   Left bundle branch block 12/19/2017   Personal history of pulmonary embolism - no longer on  anticoagulation due to pt refusal to take it anymore. 12/19/2017   Chronic kidney disease, stage III (moderate) (HCC) 12/19/2017   Benign neoplasm of brain (HCC) 12/19/2017    PCP: Gweneth Dimitri, MD  REFERRING PROVIDER: Gweneth Dimitri, MD  REFERRING DIAG: Free Text Diagnosis R29.6, R26.89, R26.9 Mult. Falls, Poor Balance, Gait Disturbance  THERAPY DIAG:  Unsteadiness on feet  Other lack of coordination  Muscle weakness (generalized)  Difficulty in walking, not elsewhere classified  Rationale for Evaluation and Treatment: Rehabilitation  ONSET DATE: chronic   SUBJECTIVE:   SUBJECTIVE STATEMENT:  No changes since yesterday. My back is really bothering me, going to see the MD about it soon, don't want surgery   EVAL: Having issues with balance and walking. I do have a meningioma in my spine, and every once in awhile I have pain if I twist or stress it. Still having pain in my neck and hip from a fall that I had about a year and a half ago. Thought that pain would go away but it hasn't even this far out/being this healed. I have a lot of unsteadiness, not sure I'd call it dizziness but I  have to stand for a minute before I get going. Have a couple of walking sticks/canes but not sure how to use them honestly. Entire R LE and UE are  still numb. When I fell I was at a friend's house and were leaving, about an hour before that I had a glass of wine, I went out first and held the door for the next people but didn't realize that the concrete platform ended and dropped about 15 inches and ended up stepping off that 15 inch ledge and hit my head/shoulder/hip/knee on the wall beyond that. Meningioma is still just being monitored, saw the MD and they said its not growing but its just there in the spine. Every single movement I make with my neck hurts when I get to the end of that range.   PERTINENT HISTORY: See above- complex medical hx  PAIN:  Are you having pain? No 0/10 now   PRECAUTIONS: Fall and Other: caution with movements, some movements can cause increased pressure/pain at meningioma site, ICD    RED FLAGS: None   WEIGHT BEARING RESTRICTIONS: No  FALLS:  Has patient fallen in last 6 months? Yes. Number of falls 1- had a mis-step coming off a ladder, fell backwards and hit the floor pretty hard  LIVING ENVIRONMENT: Lives with: lives with their spouse Lives in: House/apartment Stairs:  5 STE with B rails, has second floor in his home but holds rail    OCCUPATION: retired   PLOF: Independent, Independent with basic ADLs, Independent with gait, and Independent with transfers  PATIENT GOALS: getting more active, improving balance   NEXT MD VISIT: Referring scheduled but unsure when   OBJECTIVE:  Note: Objective measures were completed at Evaluation unless otherwise noted.    PATIENT SURVEYS:  ABC scale 65.6%; 12/13/23 65%  COGNITION: Overall cognitive status: Within functional limits for tasks assessed     SENSATION: Complete R UE and R LE numbness (chronic)    COORDINATION  Noted some ataxia in R LE with functional movements   LOWER EXTREMITY  MMT:  MMT Right eval Left eval  Hip flexion 4+ 5  Hip extension    Hip abduction 5 5  Hip adduction    Hip internal rotation    Hip external rotation    Knee flexion 5 5  Knee  extension 5 5  Ankle dorsiflexion 5 5  Ankle plantarflexion    Ankle inversion    Ankle eversion     (Blank rows = not tested)   FUNCTIONAL TESTS:  5 times sit to stand: 15.4 seconds use of UEs, some difficulty stabilizing; 11/20/23 15.2 seconds use of UEs  Timed up and go (TUG): 14.2 seconds no device but did need MinA for balance when initially standing and then turning to sit in the chair at end of test; 11/20/23 10 seconds  3 minute walk test: 641ft no device but needed MinA at times for balance on corners, RPE 4/10; 12/13/23 55ft no device distant S, RPE 3/10     12/13/23 0001  Standardized Balance Assessment  Standardized Balance Assessment Dynamic Gait Index  Dynamic Gait Index  Level Surface 2  Change in Gait Speed 2  Gait with Horizontal Head Turns 2  Gait with Vertical Head Turns 2  Gait and Pivot Turn 1  Step Over Obstacle 1  Step Around Obstacles 1  Steps 0  Total Score 11      GAIT: Distance walked: 619ft Assistive device utilized: None Level of assistance: Min A Comments: MinA for balance with turns and when navigating obstacles, some ataxic movements noted                                                                                                                                 TREATMENT DATE:    12/13/23  Nustep x6 minutes L5 limited by knee pain today  Wide tandem on blue foam pad 3x30 seconds B Lateral toe taps off blue foam pad (alternating) x20 total  Backward alternating toe taps off blue foam pad x10 total   ABC, objectives as above, POC updates, education on progress with PT and plan moving forward, encouraged getting lx spine w/u due to increasing numbness L LE along with lx symptoms having started      12/12/23  Nustep L5 x10 minutes BLEs only for  LE endurance, functional activity tolerance  Wide tandem stance one foot on blue foam/one foot on air pad 3x30 seconds each  Clearance up and over double cone x10 B, up to MinA for balance  SLS with one foot on blue wted ball 3x30 seconds B up to MinA for balance  Forward/backward ball rolls x2 rounds each side, light BUE support, up to MinA for balance  Sideways marches x4 laps in // bars up to light MinA          12/07/23 NuStep L5 x 6 min HS curls 25lb 2x10 Leg Ext 10lb 2x10 Alt 4in box taps 2x10 On airex reaching outside base of support Ball toss 2x10 Side steps at mat table Standing march  Shoulder Ext 10lb 2x10   12/04/23  Nustep all four extremities L6x5 minutes, then L5 for an additional 5 minutes    Wide tandem stance blue foam pad 3x30 seconds B  Forward  step downs from blue foam pad in // bars light BUE support x10 B Lateral step downs from blue foam pad in // bars light BUE support x10 B, one time MinA for balance recovery  Backwards step downs from blue foam pad in // bars light BUE support x10 B Forward/backward stepping off blue foam pad (trying to clear pad with swing LE) x5 B close min guard  Walking on long blue foam planks very close min guard in // bars x3 laps   11/30/23   Wide tandem stance blue foam pad 3x30 seconds B  Forward toe taps to target while standing on blue foam pad x20 minA  Stepping over half foam rolls + alternating toe taps to targets solid surface x3 rounds // bars  Nustep L6x2.5 minutes then 7.5 minutes L5 BLEs only  Figure 8 walks x2 each direction Backwards walking in bars solid surface min guard x5 laps Standing one foot on soft surface of BOSU/other on floor 3x20 seconds B       PATIENT EDUCATION:  Education details: exam findings, POC Person educated: Patient Education method: Programmer, multimedia, Demonstration, and Verbal cues Education comprehension: verbalized understanding, returned demonstration, and needs further  education  HOME EXERCISE PROGRAM:  Access Code: [email or web address] URL: [email or web address] Date: 11/20/2023 Prepared by: Nedra Hai  Exercises - Wide Tandem Stance with Eyes Open  - 1 x daily - 7 x weekly - 2 sets - 3 reps - 30 seconds  hold - Romberg Stance  - 1 x daily - 7 x weekly - 2 sets - 3 reps - 30 second  hold - Single Leg Stance with Support  - 1 x daily - 7 x weekly - 2 sets - 3 reps    ASSESSMENT:  CLINICAL IMPRESSION:  Pt arrives today doing OK, we focused on objectives for progress note. Significantly, he does report increasing whole L LE numbness that started about a month ago and has seemed to come along with increased back pain- he had not mentioned this until today. Was a bit more unsteady with certain tasks but still seems to be objectively benefiting from PT. Will direct message PCP today. Recommend he continue skilled PT care, would likely benefit from lumbar imaging if MD is agreeable as well.     EVAL: Patient is a 78 y.o. M who was seen today for physical therapy evaluation and treatment for Free Text Diagnosis R29.6, R26.89, R26.9 Mult. Falls, Poor Balance, Gait Disturbance. Very pleasant and cooperative but very talkative during eval which did limit assessment time. Still having a lot of pain in general since his fall  and ongoing numbness in R UE/LE. Gait mechanics and balance are definitely impaired, would benefit from training with appropriate AD if he is open to this. Will benefit from skilled PT services to address his concerns and functional objective impairments to the best of our ability.   OBJECTIVE IMPAIRMENTS: Abnormal gait, cardiopulmonary status limiting activity, decreased activity tolerance, decreased balance, decreased mobility, and difficulty walking.   ACTIVITY LIMITATIONS: standing, squatting, stairs, transfers, reach over head, and locomotion level  PARTICIPATION LIMITATIONS: driving, shopping, community activity, and  occupation  PERSONAL FACTORS: Age, Behavior pattern, Fitness, Past/current experiences, Social background, and Time since onset of injury/illness/exacerbation are also affecting patient's functional outcome.   REHAB POTENTIAL: Good  CLINICAL DECISION MAKING: Evolving/moderate complexity  EVALUATION COMPLEXITY: Moderate   GOALS: Goals reviewed with patient? No  SHORT TERM GOALS: Target date: 11/23/2023   Will be compliant with appropriate progressive HEP  Baseline: Goal status: MET 11/23/23  2.  Will be able to name 3 ways to prevent falls at home and in the community  Baseline:  Goal status: MET 11/23/23  3.  Will be able to perform 5xSTS and with distant S Baseline: MinA  Goal status: 12/13/23 MET   4.  Will complete TUG test in 12 seconds or less S  Baseline: MinA  Goal status: MET 11/20/23    LONG TERM GOALS: Target date: 01/10/2024      Will score at least 18 on DGI to show improved functional balance  Baseline:  Goal status: ONGOING 12/13/23 DGI 11/24  2.  Will be able to ambulate community distances (level and inclines) no device, RPE no more than 2/10  Baseline:  Goal status: ONGOING 12/13/23 (RPE 6/10)  3.  Will be able to perform all desired gym activities without increased pain from resting levels  Baseline:  Goal status: ONGOING 12/13/23 improving   4.  ABC score to improve by at least 20 points to show improved subjective status  Baseline:  Goal status: ONGOING 12/13/23    PLAN:  PT FREQUENCY: 2x/week  PT DURATION: 4 weeks  PLANNED INTERVENTIONS: 97164- PT Re-evaluation, 97110-Therapeutic exercises, 97530- Therapeutic activity, 97112- Neuromuscular re-education, 97535- Self Care, [id number]- Manual therapy, [id number]- Gait training, [phone]- Aquatic Therapy, and Balance training  PLAN FOR NEXT SESSION: balance and coordination progressions with compliant surfaces , functional activity tolerance, work on some quad strength and general LE endurance. Be careful  with his back, monitor increasing L LE numbness   Nedra Hai, PT, DPT 12/13/23 1:56 PM

## 2023-12-13 NOTE — Progress Notes (Signed)
   12/13/23 0001  Standardized Balance Assessment  Standardized Balance Assessment Dynamic Gait Index  Dynamic Gait Index  Level Surface 2  Change in Gait Speed 2  Gait with Horizontal Head Turns 2  Gait with Vertical Head Turns 2  Gait and Pivot Turn 1  Step Over Obstacle 1  Step Around Obstacles 1  Steps 0  Total Score 11

## 2023-12-14 ENCOUNTER — Ambulatory Visit: Admitting: Physical Therapy

## 2023-12-14 DIAGNOSIS — D421 Neoplasm of uncertain behavior of spinal meninges: Secondary | ICD-10-CM | POA: Diagnosis not present

## 2023-12-14 DIAGNOSIS — Z0000 Encounter for general adult medical examination without abnormal findings: Secondary | ICD-10-CM | POA: Diagnosis not present

## 2023-12-14 DIAGNOSIS — R29898 Other symptoms and signs involving the musculoskeletal system: Secondary | ICD-10-CM | POA: Diagnosis not present

## 2023-12-14 DIAGNOSIS — I255 Ischemic cardiomyopathy: Secondary | ICD-10-CM | POA: Diagnosis not present

## 2023-12-14 DIAGNOSIS — E1122 Type 2 diabetes mellitus with diabetic chronic kidney disease: Secondary | ICD-10-CM | POA: Diagnosis not present

## 2023-12-14 DIAGNOSIS — N1832 Chronic kidney disease, stage 3b: Secondary | ICD-10-CM | POA: Diagnosis not present

## 2023-12-14 DIAGNOSIS — I25119 Atherosclerotic heart disease of native coronary artery with unspecified angina pectoris: Secondary | ICD-10-CM | POA: Diagnosis not present

## 2023-12-14 DIAGNOSIS — I10 Essential (primary) hypertension: Secondary | ICD-10-CM | POA: Diagnosis not present

## 2023-12-14 DIAGNOSIS — Z7189 Other specified counseling: Secondary | ICD-10-CM | POA: Diagnosis not present

## 2023-12-14 DIAGNOSIS — I493 Ventricular premature depolarization: Secondary | ICD-10-CM | POA: Diagnosis not present

## 2023-12-14 DIAGNOSIS — M25561 Pain in right knee: Secondary | ICD-10-CM | POA: Diagnosis not present

## 2023-12-14 DIAGNOSIS — Z1331 Encounter for screening for depression: Secondary | ICD-10-CM | POA: Diagnosis not present

## 2023-12-15 NOTE — Progress Notes (Signed)
 Remote ICD transmission.

## 2023-12-15 NOTE — Addendum Note (Signed)
 Addended by: Elease Etienne A on: 12/15/2023 08:40 AM   Modules accepted: Orders

## 2023-12-19 ENCOUNTER — Ambulatory Visit: Admitting: Physical Therapy

## 2023-12-19 ENCOUNTER — Encounter: Payer: Self-pay | Admitting: Physical Therapy

## 2023-12-19 DIAGNOSIS — R262 Difficulty in walking, not elsewhere classified: Secondary | ICD-10-CM | POA: Diagnosis not present

## 2023-12-19 DIAGNOSIS — R278 Other lack of coordination: Secondary | ICD-10-CM | POA: Diagnosis not present

## 2023-12-19 DIAGNOSIS — M6281 Muscle weakness (generalized): Secondary | ICD-10-CM

## 2023-12-19 DIAGNOSIS — R296 Repeated falls: Secondary | ICD-10-CM

## 2023-12-19 DIAGNOSIS — R531 Weakness: Secondary | ICD-10-CM | POA: Diagnosis not present

## 2023-12-19 DIAGNOSIS — R2681 Unsteadiness on feet: Secondary | ICD-10-CM

## 2023-12-19 NOTE — Therapy (Signed)
 OUTPATIENT PHYSICAL THERAPY LOWER EXTREMITY TREATMENT  Patient Name: Verlan Grotz MRN: [id number] DOB:[birth year], 78 y.o., male Today's Date: 12/19/2023      END OF SESSION:  PT End of Session - 12/19/23 1059     Visit Number 11    Date for PT Re-Evaluation 01/10/24    PT Start Time 1100    PT Stop Time 1145    PT Time Calculation (min) 45 min    Activity Tolerance Patient tolerated treatment well    Behavior During Therapy [facility] for tasks assessed/performed                  Past Medical History:  Diagnosis Date   Acute blood loss anemia 10/05/2020   Acute respiratory failure (HCC), hypothermia therapy, vent - extubated 10/06/20    AKI (acute kidney injury) (HCC) 10/05/2020   Anoxic brain injury (HCC) 10/26/2020   Arrhythmia    Ascending aortic aneurysm (HCC) 01/29/2018   a. 2019 4.3 cm by echo; b. 02/2021 Echo: Ao root 40mm.   Benign brain tumor (HCC)    Benign neoplasm of brain (HCC) 12/19/2017   Cardiac arrest with ventricular fibrillation (HCC)    Chest pain 12/04/2017   Chronic kidney disease, stage III (moderate) (HCC) 12/19/2017   CKD (chronic kidney disease), stage III - IV (HCC)    Conductive hearing loss of both ears 08/24/2018   Coronary Artery Disease    a. 09/2020 Inf STEMI complicated by cardiac arrest/PCI: LAD 50p/m, LCX 100d (2.5 x 30 Resolute Onyx DES); b. 04/2021 PCI: 04/2021 LM nl, LAD 50p/23m (2.5x30 Onyx Frontier DES), LCX 25p, 45m (RFR 0.98), 40d ISR (RFR 0.98), OM2 25, RCA mild diff dzs.   Diabetes mellitus, type 2 (HCC) 10/06/2020   10/06/20 A1C 7%   Dizziness 10/11/2019   Encounter for imaging study to confirm orogastric (OG) tube placement    Eustachian tube dysfunction 08/24/2018   Hematuria 10/26/2020   HFrEF (heart failure with reduced ejection fraction) (HCC)    a. 11/2020 Echo: EF 25-30%; b. 02/2021 Echo: EF 25-30%, glob HK. Mild LVH. Nl RV fxn. Triv AI. Ao root 40mm.   History of pulmonary embolus (PE)    HLD (hyperlipidemia)  10/26/2020   Hypertension    Iron deficiency anemia rec'd IV iron 10/26/2020   Ischemic cardiomyopathy    a. 11/2020 Echo: EF 25-30%; b. 02/2021 Echo: EF 25-30%, glob HK.   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 Marland KitchenSpirometry 03/08/2018    FEV1 3.86 (108%)  Ratio 96 s prior rx  - PET  03/13/18   Low grade c/w adenoca > rec T surgery eval     STEMI (ST elevation myocardial infarction) (HCC) 10/04/2020   Urinary retention 10/26/2020   Vestibular schwannoma (HCC) 08/14/2020   Past Surgical History:  Procedure Laterality Date  APPENDECTOMY     BIV ICD INSERTION CRT-D N/A 11/03/2021   Procedure: BIV ICD INSERTION CRT-D;  Surgeon: Regan Lemming, MD;  Location: [facility] INVASIVE CV LAB;  Service: Cardiovascular;  Laterality: N/A;   CARDIAC CATHETERIZATION     CORONARY ANGIOPLASTY WITH STENT PLACEMENT Left 04/12/2021   LAD stent placement   CORONARY PRESSURE/FFR STUDY N/A 04/12/2021   Procedure: INTRAVASCULAR PRESSURE WIRE/FFR STUDY;  Surgeon: Yvonne Kendall, MD;  Location: [facility] INVASIVE CV LAB;  Service: Cardiovascular;  Laterality: N/A;   CORONARY STENT INTERVENTION N/A 04/12/2021   Procedure: CORONARY STENT INTERVENTION;  Surgeon: Yvonne Kendall, MD;  Location: [facility] INVASIVE CV LAB;  Service: Cardiovascular;  Laterality: N/A;   CORONARY ULTRASOUND/IVUS N/A 04/12/2021    Procedure: Intravascular Ultrasound/IVUS;  Surgeon: Yvonne Kendall, MD;  Location: [facility] INVASIVE CV LAB;  Service: Cardiovascular;  Laterality: N/A;   CORONARY/GRAFT ACUTE MI REVASCULARIZATION N/A 10/04/2020   Procedure: Coronary/Graft Acute MI Revascularization;  Surgeon: Yvonne Kendall, MD;  Location: [facility] INVASIVE CV LAB;  Service: Cardiovascular;  Laterality: N/A;   HERNIA REPAIR     LEFT HEART CATH AND CORONARY ANGIOGRAPHY N/A 10/04/2020   Procedure: LEFT HEART CATH AND CORONARY ANGIOGRAPHY;  Surgeon: Yvonne Kendall, MD;  Location: [facility] INVASIVE CV LAB;  Service: Cardiovascular;  Laterality: N/A;   RIGHT/LEFT HEART CATH AND CORONARY ANGIOGRAPHY N/A 04/12/2021   Procedure: RIGHT/LEFT HEART CATH AND CORONARY ANGIOGRAPHY;  Surgeon: Yvonne Kendall, MD;  Location: [facility] INVASIVE CV LAB;  Service: Cardiovascular;  Laterality: N/A;   Patient Active Problem List   Diagnosis Date Noted   Anemia    Leukocytosis    Right arm weakness 01/25/2022   Right sided weakness    Fall at home 01/22/2022   Status post biventricular pacemaker - MRI compatible PPM and leads 01/22/2022   Chronic indwelling Foley catheter 01/22/2022   CKD (chronic kidney disease), stage III - IV (HCC) 06/28/2021   Chronic HFrEF (heart failure with reduced ejection fraction) (HCC) 03/31/2021   Arrhythmia    DM (diabetes mellitus), type 2 (HCC) new 10/26/2020   Urinary retention 10/26/2020   Hyperlipidemia LDL goal <70 10/26/2020   Coronary artery disease of native artery of native heart with stable angina pectoris (HCC) 10/26/2020   S/P angioplasty with stent 10/04/20 DES to LCX  10/26/2020   Ischemic cardiomyopathy 10/26/2020   Diabetes mellitus, type 2 (HCC) 10/06/2020   DOE (dyspnea on exertion)    Meningioma (HCC) 08/14/2020   Vestibular schwannoma (HCC) 08/14/2020   Cardiac arrest with ventricular fibrillation (HCC)    Pain of left hip joint 05/29/2020   Benign brain tumor (HCC)    Chronic kidney disease    History of  pulmonary embolus (PE)    Hypertension    Nonsustained ventricular tachycardia (HCC) 10/11/2019   Dizziness 10/11/2019   Abnormal echocardiogram 06/07/2019   Eustachian tube dysfunction 08/24/2018   Conductive hearing loss of both ears 08/24/2018   Pulmonary hypertension due to thromboembolism (HCC) 03/09/2018   Solitary pulmonary nodule on lung CT 03/08/2018   Ascending aortic aneurysm (HCC) 4.2 cm based on CT from December 2020 01/29/2018   Sinus bradycardia 12/22/2017   Left bundle branch block 12/19/2017   Personal history of pulmonary embolism - no longer on anticoagulation due to pt refusal to take it anymore. 12/19/2017   Chronic kidney disease, stage III (moderate) (HCC) 12/19/2017   Benign neoplasm of brain (HCC) 12/19/2017    PCP: Gweneth Dimitri, MD  REFERRING PROVIDER: Gweneth Dimitri, MD  REFERRING DIAG: Free Text Diagnosis  R29.6, R26.89, R26.9 Mult. Falls, Poor Balance, Gait Disturbance  THERAPY DIAG:  Unsteadiness on feet  Other lack of coordination  Muscle weakness (generalized)  Difficulty in walking, not elsewhere classified  Repeated falls  Right sided weakness  Rationale for Evaluation and Treatment: Rehabilitation  ONSET DATE: chronic   SUBJECTIVE:   SUBJECTIVE STATEMENT:  "Could be better" Pain extreme at times in his low back when he twist and bends it, has am appointment tomorrow to get it checked out. Melanoma in is spine  EVAL: Having issues with balance and walking. I do have a meningioma in my spine, and every once in awhile I have pain if I twist or stress it. Still having pain in my neck and hip from a fall that I had about a year and a half ago. Thought that pain would go away but it hasn't even this far out/being this healed. I have a lot of unsteadiness, not sure I'd call it dizziness but I have to stand for a minute before I get going. Have a couple of walking sticks/canes but not sure how to use them honestly. Entire R LE and UE are   still numb. When I fell I was at a friend's house and were leaving, about an hour before that I had a glass of wine, I went out first and held the door for the next people but didn't realize that the concrete platform ended and dropped about 15 inches and ended up stepping off that 15 inch ledge and hit my head/shoulder/hip/knee on the wall beyond that. Meningioma is still just being monitored, saw the MD and they said its not growing but its just there in the spine. Every single movement I make with my neck hurts when I get to the end of that range.   PERTINENT HISTORY: See above- complex medical hx  PAIN:  Are you having pain? No 9/10 when its bad   PRECAUTIONS: Fall and Other: caution with movements, some movements can cause increased pressure/pain at meningioma site, ICD    RED FLAGS: None   WEIGHT BEARING RESTRICTIONS: No  FALLS:  Has patient fallen in last 6 months? Yes. Number of falls 1- had a mis-step coming off a ladder, fell backwards and hit the floor pretty hard  LIVING ENVIRONMENT: Lives with: lives with their spouse Lives in: House/apartment Stairs:  5 STE with B rails, has second floor in his home but holds rail    OCCUPATION: retired   PLOF: Independent, Independent with basic ADLs, Independent with gait, and Independent with transfers  PATIENT GOALS: getting more active, improving balance   NEXT MD VISIT: Referring scheduled but unsure when   OBJECTIVE:  Note: Objective measures were completed at Evaluation unless otherwise noted.    PATIENT SURVEYS:  ABC scale 65.6%; 12/13/23 65%  COGNITION: Overall cognitive status: Within functional limits for tasks assessed     SENSATION: Complete R UE and R LE numbness (chronic)    COORDINATION  Noted some ataxia in R LE with functional movements   LOWER EXTREMITY MMT:  MMT Right eval Left eval  Hip flexion 4+ 5  Hip extension    Hip abduction 5 5  Hip adduction    Hip internal rotation    Hip  external rotation    Knee flexion 5 5  Knee extension 5 5  Ankle dorsiflexion 5 5  Ankle plantarflexion    Ankle inversion    Ankle eversion     (Blank rows = not tested)  FUNCTIONAL TESTS:  5 times sit to stand: 15.4 seconds use of UEs, some difficulty stabilizing; 11/20/23 15.2 seconds use of UEs  Timed up and go (TUG): 14.2 seconds no device but did need MinA for balance when initially standing and then turning to sit in the chair at end of test; 11/20/23 10 seconds  3 minute walk test: 630ft no device but needed MinA at times for balance on corners, RPE 4/10; 12/13/23 513ft no device distant S, RPE 3/10     12/13/23 0001  Standardized Balance Assessment  Standardized Balance Assessment Dynamic Gait Index  Dynamic Gait Index  Level Surface 2  Change in Gait Speed 2  Gait with Horizontal Head Turns 2  Gait with Vertical Head Turns 2  Gait and Pivot Turn 1  Step Over Obstacle 1  Step Around Obstacles 1  Steps 0  Total Score 11      GAIT: Distance walked: 66ft Assistive device utilized: None Level of assistance: Min A Comments: MinA for balance with turns and when navigating obstacles, some ataxic movements noted                                                                                                                                 TREATMENT DATE:  12/19/23 NuStep L 5 x 7 min seat back for decrease knee flex  Alt 4in box taps 2x10 Side step at mat table  Sit to stand LE on airex elevated surface some UE use 2x10 On airex reaching outside [employer]. Ball kicks   Combo w/ toss and kicks Pball roll outs  Heel raises  12/13/23  Nustep x6 minutes L5 limited by knee pain today  Wide tandem on blue foam pad 3x30 seconds B Lateral toe taps off blue foam pad (alternating) x20 total  Backward alternating toe taps off blue foam pad x10 total   ABC, objectives as above, POC updates, education on progress with PT and plan moving forward, encouraged getting  lx spine w/u due to increasing numbness L LE along with lx symptoms having started    12/12/23  Nustep L5 x10 minutes BLEs only for LE endurance, functional activity tolerance  Wide tandem stance one foot on blue foam/one foot on air pad 3x30 seconds each  Clearance up and over double cone x10 B, up to MinA for balance  SLS with one foot on blue wted ball 3x30 seconds B up to MinA for balance  Forward/backward ball rolls x2 rounds each side, light BUE support, up to MinA for balance  Sideways marches x4 laps in // bars up to light MinA    12/07/23 NuStep L5 x 6 min HS curls 25lb 2x10 Leg Ext 10lb 2x10 Alt 4in box taps 2x10 On airex reaching outside base of support Ball toss 2x10 Side steps at mat table Standing march  Shoulder Ext 10lb 2x10   12/04/23  Nustep all four extremities L6x5 minutes, then L5  for an additional 5 minutes    Wide tandem stance blue foam pad 3x30 seconds B  Forward step downs from blue foam pad in // bars light BUE support x10 B Lateral step downs from blue foam pad in // bars light BUE support x10 B, one time MinA for balance recovery  Backwards step downs from blue foam pad in // bars light BUE support x10 B Forward/backward stepping off blue foam pad (trying to clear pad with swing LE) x5 B close min guard  Walking on long blue foam planks very close min guard in // bars x3 laps   11/30/23   Wide tandem stance blue foam pad 3x30 seconds B  Forward toe taps to target while standing on blue foam pad x20 minA  Stepping over half foam rolls + alternating toe taps to targets solid surface x3 rounds // bars  Nustep L6x2.5 minutes then 7.5 minutes L5 BLEs only  Figure 8 walks x2 each direction Backwards walking in bars solid surface min guard x5 laps Standing one foot on soft surface of BOSU/other on floor 3x20 seconds B       PATIENT EDUCATION:  Education details: exam findings, POC Person educated: Patient Education method: Programmer, multimedia,  Demonstration, and Verbal cues Education comprehension: verbalized understanding, returned demonstration, and needs further education  HOME EXERCISE PROGRAM:  Access Code: [email or web address] URL: [email or web address] Date: 11/20/2023 Prepared by: Nedra Hai  Exercises - Wide Tandem Stance with Eyes Open  - 1 x daily - 7 x weekly - 2 sets - 3 reps - 30 seconds  hold - Romberg Stance  - 1 x daily - 7 x weekly - 2 sets - 3 reps - 30 second  hold - Single Leg Stance with Support  - 1 x daily - 7 x weekly - 2 sets - 3 reps    ASSESSMENT:  CLINICAL IMPRESSION:  Pt arrives today doing despite reports of back pain with bending and twisting. He is very unstable with noncompliant surfaces. He does have moments of instability during gait. Signs of increase fatigeu today with activity. Pt would often brace himself against the table with sit to stands, side steps, and alt box taps. Recommend he continue skilled PT care, would likely benefit from lumbar imaging if MD is agreeable as well.     EVAL: Patient is a 78 y.o. M who was seen today for physical therapy evaluation and treatment for Free Text Diagnosis R29.6, R26.89, R26.9 Mult. Falls, Poor Balance, Gait Disturbance. Very pleasant and cooperative but very talkative during eval which did limit assessment time. Still having a lot of pain in general since his fall  and ongoing numbness in R UE/LE. Gait mechanics and balance are definitely impaired, would benefit from training with appropriate AD if he is open to this. Will benefit from skilled PT services to address his concerns and functional objective impairments to the best of our ability.   OBJECTIVE IMPAIRMENTS: Abnormal gait, cardiopulmonary status limiting activity, decreased activity tolerance, decreased balance, decreased mobility, and difficulty walking.   ACTIVITY LIMITATIONS: standing, squatting, stairs, transfers, reach over head, and locomotion level  PARTICIPATION  LIMITATIONS: driving, shopping, community activity, and occupation  PERSONAL FACTORS: Age, Behavior pattern, Fitness, Past/current experiences, Social background, and Time since onset of injury/illness/exacerbation are also affecting patient's functional outcome.   REHAB POTENTIAL: Good  CLINICAL DECISION MAKING: Evolving/moderate complexity  EVALUATION COMPLEXITY: Moderate   GOALS: Goals reviewed with patient? No  SHORT TERM GOALS: Target date: 11/23/2023   Will be  compliant with appropriate progressive HEP  Baseline: Goal status: MET 11/23/23  2.  Will be able to name 3 ways to prevent falls at home and in the community  Baseline:  Goal status: MET 11/23/23  3.  Will be able to perform 5xSTS and with distant S Baseline: MinA  Goal status: 12/13/23 MET   4.  Will complete TUG test in 12 seconds or less S  Baseline: MinA  Goal status: MET 11/20/23    LONG TERM GOALS: Target date: 01/10/2024      Will score at least 18 on DGI to show improved functional balance  Baseline:  Goal status: ONGOING 12/13/23 DGI 11/24  2.  Will be able to ambulate community distances (level and inclines) no device, RPE no more than 2/10  Baseline:  Goal status: ONGOING 12/13/23 (RPE 6/10)  3.  Will be able to perform all desired gym activities without increased pain from resting levels  Baseline:  Goal status: ONGOING 12/13/23 improving   4.  ABC score to improve by at least 20 points to show improved subjective status  Baseline:  Goal status: ONGOING 12/13/23    PLAN:  PT FREQUENCY: 2x/week  PT DURATION: 4 weeks  PLANNED INTERVENTIONS: 97164- PT Re-evaluation, 97110-Therapeutic exercises, 97530- Therapeutic activity, 97112- Neuromuscular re-education, 97535- Self Care, [id number]- Manual therapy, [id number]- Gait training, [phone]- Aquatic Therapy, and Balance training  PLAN FOR NEXT SESSION: balance and coordination progressions with compliant surfaces , functional activity tolerance, work on  some quad strength and general LE endurance. Be careful with his back, monitor increasing L LE numbness   Nedra Hai, PT, DPT 12/19/23 10:59 AM

## 2023-12-20 DIAGNOSIS — M545 Low back pain, unspecified: Secondary | ICD-10-CM | POA: Diagnosis not present

## 2023-12-21 ENCOUNTER — Encounter: Payer: Self-pay | Admitting: Physical Therapy

## 2023-12-21 ENCOUNTER — Ambulatory Visit: Admitting: Physical Therapy

## 2023-12-21 DIAGNOSIS — R278 Other lack of coordination: Secondary | ICD-10-CM

## 2023-12-21 DIAGNOSIS — M6281 Muscle weakness (generalized): Secondary | ICD-10-CM | POA: Diagnosis not present

## 2023-12-21 DIAGNOSIS — R2681 Unsteadiness on feet: Secondary | ICD-10-CM | POA: Diagnosis not present

## 2023-12-21 DIAGNOSIS — R296 Repeated falls: Secondary | ICD-10-CM | POA: Diagnosis not present

## 2023-12-21 DIAGNOSIS — R531 Weakness: Secondary | ICD-10-CM | POA: Diagnosis not present

## 2023-12-21 DIAGNOSIS — R262 Difficulty in walking, not elsewhere classified: Secondary | ICD-10-CM | POA: Diagnosis not present

## 2023-12-21 NOTE — Therapy (Signed)
 OUTPATIENT PHYSICAL THERAPY LOWER EXTREMITY TREATMENT  Patient Name: Timothy Moran MRN: [id number] DOB:02-Jul-1946, 78 y.o., male Today's Date: 12/21/2023      END OF SESSION:  PT End of Session - 12/21/23 1103     Visit Number 12    Number of Visits 17    Date for PT Re-Evaluation 01/10/24    Authorization Type MCR    Progress Note Due on Visit 20    PT Start Time 1102    PT Stop Time 1142    PT Time Calculation (min) 40 min    Activity Tolerance Patient tolerated treatment well    Behavior During Therapy [facility] for tasks assessed/performed                   Past Medical History:  Diagnosis Date   Acute blood loss anemia 10/05/2020   Acute respiratory failure (HCC), hypothermia therapy, vent - extubated 10/06/20    AKI (acute kidney injury) (HCC) 10/05/2020   Anoxic brain injury (HCC) 10/26/2020   Arrhythmia    Ascending aortic aneurysm (HCC) 01/29/2018   a. 2019 4.3 cm by echo; b. 02/2021 Echo: Ao root 40mm.   Benign brain tumor (HCC)    Benign neoplasm of brain (HCC) 12/19/2017   Cardiac arrest with ventricular fibrillation (HCC)    Chest pain 12/04/2017   Chronic kidney disease, stage III (moderate) (HCC) 12/19/2017   CKD (chronic kidney disease), stage III - IV (HCC)    Conductive hearing loss of both ears 08/24/2018   Coronary Artery Disease    a. 09/2020 Inf STEMI complicated by cardiac arrest/PCI: LAD 50p/m, LCX 100d (2.5 x 30 Resolute Onyx DES); b. 04/2021 PCI: 04/2021 LM nl, LAD 50p/71m (2.5x30 Onyx Frontier DES), LCX 25p, 88m (RFR 0.98), 40d ISR (RFR 0.98), OM2 25, RCA mild diff dzs.   Diabetes mellitus, type 2 (HCC) 10/06/2020   10/06/20 A1C 7%   Dizziness 10/11/2019   Encounter for imaging study to confirm orogastric (OG) tube placement    Eustachian tube dysfunction 08/24/2018   Hematuria 10/26/2020   HFrEF (heart failure with reduced ejection fraction) (HCC)    a. 11/2020 Echo: EF 25-30%; b. 02/2021 Echo: EF 25-30%, glob HK. Mild LVH. Nl RV fxn. Triv  AI. Ao root 40mm.   History of pulmonary embolus (PE)    HLD (hyperlipidemia) 10/26/2020   Hypertension    Iron deficiency anemia rec'd IV iron 10/26/2020   Ischemic cardiomyopathy    a. 11/2020 Echo: EF 25-30%; b. 02/2021 Echo: EF 25-30%, glob HK.   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 Marland KitchenSpirometry 03/08/2018    FEV1 3.86 (108%)  Ratio 96 s prior rx  - PET  03/13/18   Low grade c/w adenoca > rec T surgery eval     STEMI (ST elevation myocardial infarction) (HCC)  10/04/2020   Urinary retention 10/26/2020   Vestibular schwannoma (HCC) 08/14/2020   Past Surgical History:  Procedure Laterality Date   APPENDECTOMY     BIV ICD INSERTION CRT-D N/A 11/03/2021   Procedure: BIV ICD INSERTION CRT-D;  Surgeon: Regan Lemming, MD;  Location: [facility] INVASIVE CV LAB;  Service: Cardiovascular;  Laterality: N/A;   CARDIAC CATHETERIZATION     CORONARY ANGIOPLASTY WITH STENT PLACEMENT Left 04/12/2021   LAD stent placement   CORONARY PRESSURE/FFR STUDY N/A 04/12/2021   Procedure: INTRAVASCULAR PRESSURE WIRE/FFR STUDY;  Surgeon: Yvonne Kendall, MD;  Location: [facility] INVASIVE CV LAB;  Service: Cardiovascular;  Laterality: N/A;   CORONARY STENT INTERVENTION N/A 04/12/2021   Procedure: CORONARY STENT INTERVENTION;  Surgeon: Yvonne Kendall, MD;  Location: [facility] INVASIVE CV LAB;  Service:  Cardiovascular;  Laterality: N/A;   CORONARY ULTRASOUND/IVUS N/A 04/12/2021   Procedure: Intravascular Ultrasound/IVUS;  Surgeon: Yvonne Kendall, MD;  Location: [facility] INVASIVE CV LAB;  Service: Cardiovascular;  Laterality: N/A;   CORONARY/GRAFT ACUTE MI REVASCULARIZATION N/A 10/04/2020   Procedure: Coronary/Graft Acute MI Revascularization;  Surgeon: Yvonne Kendall, MD;  Location: [facility] INVASIVE CV LAB;  Service: Cardiovascular;  Laterality: N/A;   HERNIA REPAIR     LEFT HEART CATH AND CORONARY ANGIOGRAPHY N/A 10/04/2020   Procedure: LEFT HEART CATH AND CORONARY ANGIOGRAPHY;  Surgeon: Yvonne Kendall, MD;  Location: [facility] INVASIVE CV LAB;  Service: Cardiovascular;  Laterality: N/A;   RIGHT/LEFT HEART CATH AND CORONARY ANGIOGRAPHY N/A 04/12/2021   Procedure: RIGHT/LEFT HEART CATH AND CORONARY ANGIOGRAPHY;  Surgeon: Yvonne Kendall, MD;  Location: [facility] INVASIVE CV LAB;  Service: Cardiovascular;  Laterality: N/A;   Patient Active Problem List   Diagnosis Date Noted   Anemia    Leukocytosis    Right arm weakness 01/25/2022   Right sided weakness    Fall at home 01/22/2022   Status post biventricular pacemaker - MRI compatible PPM and leads 01/22/2022   Chronic indwelling Foley catheter 01/22/2022   CKD (chronic kidney disease), stage III - IV (HCC) 06/28/2021   Chronic HFrEF (heart failure with reduced ejection fraction) (HCC) 03/31/2021   Arrhythmia    DM (diabetes mellitus), type 2 (HCC) new 10/26/2020   Urinary retention 10/26/2020   Hyperlipidemia LDL goal <70 10/26/2020   Coronary artery disease of native artery of native heart with stable angina pectoris (HCC) 10/26/2020   S/P angioplasty with stent 10/04/20 DES to LCX  10/26/2020   Ischemic cardiomyopathy 10/26/2020   Diabetes mellitus, type 2 (HCC) 10/06/2020   DOE (dyspnea on exertion)    Meningioma (HCC) 08/14/2020   Vestibular schwannoma (HCC) 08/14/2020   Cardiac arrest with ventricular fibrillation (HCC)    Pain of left hip joint  05/29/2020   Benign brain tumor (HCC)    Chronic kidney disease    History of pulmonary embolus (PE)    Hypertension    Nonsustained ventricular tachycardia (HCC) 10/11/2019   Dizziness 10/11/2019   Abnormal echocardiogram 06/07/2019   Eustachian tube dysfunction 08/24/2018   Conductive hearing loss of both ears 08/24/2018   Pulmonary hypertension due to thromboembolism (HCC) 03/09/2018   Solitary pulmonary nodule on lung CT 03/08/2018   Ascending aortic aneurysm (HCC) 4.2 cm based on CT from December 2020 01/29/2018   Sinus bradycardia 12/22/2017   Left bundle branch block 12/19/2017   Personal history of pulmonary embolism - no longer on anticoagulation due to pt refusal to take it anymore. 12/19/2017   Chronic kidney disease, stage III (moderate) (HCC) 12/19/2017   Benign neoplasm  of brain (HCC) 12/19/2017    PCP: Gweneth Dimitri, MD  REFERRING PROVIDER: Gweneth Dimitri, MD  REFERRING DIAG: Free Text Diagnosis R29.6, R26.89, R26.9 Mult. Falls, Poor Balance, Gait Disturbance  THERAPY DIAG:  Unsteadiness on feet  Other lack of coordination  Muscle weakness (generalized)  Difficulty in walking, not elsewhere classified  Rationale for Evaluation and Treatment: Rehabilitation  ONSET DATE: chronic   SUBJECTIVE:   SUBJECTIVE STATEMENT:  I saw the doctor and we talked about my back, did more xrays yesterday and meningiomas haven't changed. They are planning on doing an MRI next. Told them I was in PT and they said I need to work on my core. Went to [employer] and there were no carts for me to use so walked around the store without and I'm really sore in my legs today. Still waiting on a call about scheduling MRI.   EVAL: Having issues with balance and walking. I do have a meningioma in my spine, and every once in awhile I have pain if I twist or stress it. Still having pain in my neck and hip from a fall that I had about a year and a half ago. Thought that pain would go away but it  hasn't even this far out/being this healed. I have a lot of unsteadiness, not sure I'd call it dizziness but I have to stand for a minute before I get going. Have a couple of walking sticks/canes but not sure how to use them honestly. Entire R LE and UE are  still numb. When I fell I was at a friend's house and were leaving, about an hour before that I had a glass of wine, I went out first and held the door for the next people but didn't realize that the concrete platform ended and dropped about 15 inches and ended up stepping off that 15 inch ledge and hit my head/shoulder/hip/knee on the wall beyond that. Meningioma is still just being monitored, saw the MD and they said its not growing but its just there in the spine. Every single movement I make with my neck hurts when I get to the end of that range.   PERTINENT HISTORY: See above- complex medical hx  PAIN:  Are you having pain? No 0/10 at rest, twisting and bending hurts the back   PRECAUTIONS: Fall and Other: caution with movements, some movements can cause increased pressure/pain at meningioma site, ICD    RED FLAGS: None   WEIGHT BEARING RESTRICTIONS: No  FALLS:  Has patient fallen in last 6 months? Yes. Number of falls 1- had a mis-step coming off a ladder, fell backwards and hit the floor pretty hard  LIVING ENVIRONMENT: Lives with: lives with their spouse Lives in: House/apartment Stairs:  5 STE with B rails, has second floor in his home but holds rail    OCCUPATION: retired   PLOF: Independent, Independent with basic ADLs, Independent with gait, and Independent with transfers  PATIENT GOALS: getting more active, improving balance   NEXT MD VISIT: Referring scheduled but unsure when   OBJECTIVE:  Note: Objective measures were completed at Evaluation unless otherwise noted.    PATIENT SURVEYS:  ABC scale 65.6%; 12/13/23 65%  COGNITION: Overall cognitive status: Within functional limits for tasks  assessed     SENSATION: Complete R UE and R LE numbness (chronic)    COORDINATION  Noted some ataxia in R LE with functional movements   LOWER EXTREMITY MMT:  MMT Right eval Left eval  Hip flexion 4+ 5  Hip extension    Hip abduction 5 5  Hip adduction    Hip internal rotation    Hip external rotation    Knee flexion 5 5  Knee extension 5 5  Ankle dorsiflexion 5 5  Ankle plantarflexion    Ankle inversion    Ankle eversion     (Blank rows = not tested)   FUNCTIONAL TESTS:  5 times sit to stand: 15.4 seconds use of UEs, some difficulty stabilizing; 11/20/23 15.2 seconds use of UEs  Timed up and go (TUG): 14.2 seconds no device but did need MinA for balance when initially standing and then turning to sit in the chair at end of test; 11/20/23 10 seconds  3 minute walk test: 619ft no device but needed MinA at times for balance on corners, RPE 4/10; 12/13/23 532ft no device distant S, RPE 3/10     12/13/23 0001  Standardized Balance Assessment  Standardized Balance Assessment Dynamic Gait Index  Dynamic Gait Index  Level Surface 2  Change in Gait Speed 2  Gait with Horizontal Head Turns 2  Gait with Vertical Head Turns 2  Gait and Pivot Turn 1  Step Over Obstacle 1  Step Around Obstacles 1  Steps 0  Total Score 11      GAIT: Distance walked: 687ft Assistive device utilized: None Level of assistance: Min A Comments: MinA for balance with turns and when navigating obstacles, some ataxic movements noted                                                                                                                                 TREATMENT DATE:   12/21/23  Hooklying TA sets 15x3 seconds  PPT 15x3 seconds TA set + alternating march from hooklying x20  Reviewed and enforced BLT precautions for his back until MRI can clarify what is happening- he often pushes through pain and encouraged him not to do this to avoid worsening of condition    Nustep L3x8  minutes (dropped resistance due to R knee pain) BLEs only  One foot on BOSU/one foot on solid ground 3x30 seconds (harder for him today)      12/19/23 NuStep L 5 x 7 min seat back for decrease knee flex  Alt 4in box taps 2x10 Side step at mat table  Sit to stand LE on airex elevated surface some UE use 2x10 On airex reaching outside [employer]. Ball kicks   Combo w/ toss and kicks Pball roll outs  Heel raises  12/13/23  Nustep x6 minutes L5 limited by knee pain today  Wide tandem on blue foam pad 3x30 seconds B Lateral toe taps off blue foam pad (alternating) x20 total  Backward alternating toe taps off blue foam pad x10 total   ABC, objectives as above, POC updates, education on progress with PT and plan moving forward, encouraged getting lx spine w/u due to  increasing numbness L LE along with lx symptoms having started    12/12/23  Nustep L5 x10 minutes BLEs only for LE endurance, functional activity tolerance  Wide tandem stance one foot on blue foam/one foot on air pad 3x30 seconds each  Clearance up and over double cone x10 B, up to MinA for balance  SLS with one foot on blue wted ball 3x30 seconds B up to MinA for balance  Forward/backward ball rolls x2 rounds each side, light BUE support, up to MinA for balance  Sideways marches x4 laps in // bars up to light MinA    12/07/23 NuStep L5 x 6 min HS curls 25lb 2x10 Leg Ext 10lb 2x10 Alt 4in box taps 2x10 On airex reaching outside base of support Ball toss 2x10 Side steps at mat table Standing march  Shoulder Ext 10lb 2x10         PATIENT EDUCATION:  Education details: exam findings, POC Person educated: Patient Education method: Explanation, Demonstration, and Verbal cues Education comprehension: verbalized understanding, returned demonstration, and needs further education  HOME EXERCISE PROGRAM:  Access Code: [email or web address] URL: [email or web address] Date: 12/21/2023 Prepared by:  Nedra Hai  Exercises - Wide Tandem Stance with Eyes Open  - 1 x daily - 7 x weekly - 2 sets - 3 reps - 30 seconds  hold - Romberg Stance  - 1 x daily - 7 x weekly - 2 sets - 3 reps - 30 second  hold - Single Leg Stance with Support  - 1 x daily - 7 x weekly - 2 sets - 3 reps - Supine Transversus Abdominis Bracing - Hands on Stomach  - 5 x daily - 7 x weekly - 1 sets - 10 reps - 3 seconds  hold - Supine Posterior Pelvic Tilt  - 2 x daily - 7 x weekly - 1 sets - 10 reps - 3 seconds  hold - Supine March  - 1 x daily - 7 x weekly - 3 sets - 10 reps    ASSESSMENT:  CLINICAL IMPRESSION:   Pt arrives today with worsening back pain, he will be getting MRI as Xrays were clear. Spent time educating and practicing core exercises and enforcing/practicing BLT precautions for his back. Otherwise worked on LE endurance and balance as time allowed. I am concerned about his symptoms in his back given their severity as well as new numbness in L LE, eagerly awaiting results of MRI.    EVAL: Patient is a 78 y.o. M who was seen today for physical therapy evaluation and treatment for Free Text Diagnosis R29.6, R26.89, R26.9 Mult. Falls, Poor Balance, Gait Disturbance. Very pleasant and cooperative but very talkative during eval which did limit assessment time. Still having a lot of pain in general since his fall  and ongoing numbness in R UE/LE. Gait mechanics and balance are definitely impaired, would benefit from training with appropriate AD if he is open to this. Will benefit from skilled PT services to address his concerns and functional objective impairments to the best of our ability.   OBJECTIVE IMPAIRMENTS: Abnormal gait, cardiopulmonary status limiting activity, decreased activity tolerance, decreased balance, decreased mobility, and difficulty walking.   ACTIVITY LIMITATIONS: standing, squatting, stairs, transfers, reach over head, and locomotion level  PARTICIPATION LIMITATIONS: driving, shopping,  community activity, and occupation  PERSONAL FACTORS: Age, Behavior pattern, Fitness, Past/current experiences, Social background, and Time since onset of injury/illness/exacerbation are also affecting patient's functional outcome.   REHAB POTENTIAL: Good  CLINICAL DECISION MAKING: Evolving/moderate  complexity  EVALUATION COMPLEXITY: Moderate   GOALS: Goals reviewed with patient? No  SHORT TERM GOALS: Target date: 11/23/2023   Will be compliant with appropriate progressive HEP  Baseline: Goal status: MET 11/23/23  2.  Will be able to name 3 ways to prevent falls at home and in the community  Baseline:  Goal status: MET 11/23/23  3.  Will be able to perform 5xSTS and with distant S Baseline: MinA  Goal status: 12/13/23 MET   4.  Will complete TUG test in 12 seconds or less S  Baseline: MinA  Goal status: MET 11/20/23    LONG TERM GOALS: Target date: 01/10/2024      Will score at least 18 on DGI to show improved functional balance  Baseline:  Goal status: ONGOING 12/13/23 DGI 11/24  2.  Will be able to ambulate community distances (level and inclines) no device, RPE no more than 2/10  Baseline:  Goal status: ONGOING 12/13/23 (RPE 6/10)  3.  Will be able to perform all desired gym activities without increased pain from resting levels  Baseline:  Goal status: ONGOING 12/13/23 improving   4.  ABC score to improve by at least 20 points to show improved subjective status  Baseline:  Goal status: ONGOING 12/13/23    PLAN:  PT FREQUENCY: 2x/week  PT DURATION: 4 weeks  PLANNED INTERVENTIONS: 97164- PT Re-evaluation, 97110-Therapeutic exercises, 97530- Therapeutic activity, 97112- Neuromuscular re-education, 97535- Self Care, [id number]- Manual therapy, [id number]- Gait training, [phone]- Aquatic Therapy, and Balance training  PLAN FOR NEXT SESSION: balance and coordination progressions with compliant surfaces , functional activity tolerance, work on some quad strength and general  LE endurance. Be careful with his back, monitor increasing L LE numbness- recommend BLT precautions for now given severity of pain/pattern  Nedra Hai, PT, DPT 12/21/23 11:43 AM

## 2023-12-26 ENCOUNTER — Encounter: Payer: Self-pay | Admitting: Physical Therapy

## 2023-12-26 ENCOUNTER — Ambulatory Visit: Admitting: Physical Therapy

## 2023-12-26 DIAGNOSIS — M6281 Muscle weakness (generalized): Secondary | ICD-10-CM | POA: Diagnosis not present

## 2023-12-26 DIAGNOSIS — R2681 Unsteadiness on feet: Secondary | ICD-10-CM

## 2023-12-26 DIAGNOSIS — R296 Repeated falls: Secondary | ICD-10-CM | POA: Diagnosis not present

## 2023-12-26 DIAGNOSIS — R278 Other lack of coordination: Secondary | ICD-10-CM

## 2023-12-26 DIAGNOSIS — R262 Difficulty in walking, not elsewhere classified: Secondary | ICD-10-CM

## 2023-12-26 DIAGNOSIS — R531 Weakness: Secondary | ICD-10-CM | POA: Diagnosis not present

## 2023-12-26 NOTE — Therapy (Signed)
 OUTPATIENT PHYSICAL THERAPY LOWER EXTREMITY TREATMENT  Patient Name: Timothy Moran MRN: [id number] DOB:02-25-1946, 78 y.o., male Today's Date: 12/26/2023      END OF SESSION:  PT End of Session - 12/26/23 1054     Visit Number 13    Date for PT Re-Evaluation 01/10/24    PT Start Time 1055    PT Stop Time 1140    PT Time Calculation (min) 45 min    Activity Tolerance Patient tolerated treatment well    Behavior During Therapy [facility] for tasks assessed/performed                   Past Medical History:  Diagnosis Date   Acute blood loss anemia 10/05/2020   Acute respiratory failure (HCC), hypothermia therapy, vent - extubated 10/06/20    AKI (acute kidney injury) (HCC) 10/05/2020   Anoxic brain injury (HCC) 10/26/2020   Arrhythmia    Ascending aortic aneurysm (HCC) 01/29/2018   a. 2019 4.3 cm by echo; b. 02/2021 Echo: Ao root 40mm.   Benign brain tumor (HCC)    Benign neoplasm of brain (HCC) 12/19/2017   Cardiac arrest with ventricular fibrillation (HCC)    Chest pain 12/04/2017   Chronic kidney disease, stage III (moderate) (HCC) 12/19/2017   CKD (chronic kidney disease), stage III - IV (HCC)    Conductive hearing loss of both ears 08/24/2018   Coronary Artery Disease    a. 09/2020 Inf STEMI complicated by cardiac arrest/PCI: LAD 50p/m, LCX 100d (2.5 x 30 Resolute Onyx DES); b. 04/2021 PCI: 04/2021 LM nl, LAD 50p/34m (2.5x30 Onyx Frontier DES), LCX 25p, 89m (RFR 0.98), 40d ISR (RFR 0.98), OM2 25, RCA mild diff dzs.   Diabetes mellitus, type 2 (HCC) 10/06/2020   10/06/20 A1C 7%   Dizziness 10/11/2019   Encounter for imaging study to confirm orogastric (OG) tube placement    Eustachian tube dysfunction 08/24/2018   Hematuria 10/26/2020   HFrEF (heart failure with reduced ejection fraction) (HCC)    a. 11/2020 Echo: EF 25-30%; b. 02/2021 Echo: EF 25-30%, glob HK. Mild LVH. Nl RV fxn. Triv AI. Ao root 40mm.   History of pulmonary embolus (PE)    HLD (hyperlipidemia)  10/26/2020   Hypertension    Iron deficiency anemia rec'd IV iron 10/26/2020   Ischemic cardiomyopathy    a. 11/2020 Echo: EF 25-30%; b. 02/2021 Echo: EF 25-30%, glob HK.   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 Marland KitchenSpirometry 03/08/2018    FEV1 3.86 (108%)  Ratio 96 s prior rx  - PET  03/13/18   Low grade c/w adenoca > rec T surgery eval     STEMI (ST elevation myocardial infarction) (HCC) 10/04/2020   Urinary retention 10/26/2020   Vestibular schwannoma (HCC) 08/14/2020   Past Surgical History:  Procedure Laterality Date  APPENDECTOMY     BIV ICD INSERTION CRT-D N/A 11/03/2021   Procedure: BIV ICD INSERTION CRT-D;  Surgeon: Lei Pump, MD;  Location: [facility] INVASIVE CV LAB;  Service: Cardiovascular;  Laterality: N/A;   CARDIAC CATHETERIZATION     CORONARY ANGIOPLASTY WITH STENT PLACEMENT Left 04/12/2021   LAD stent placement   CORONARY PRESSURE/FFR STUDY N/A 04/12/2021   Procedure: INTRAVASCULAR PRESSURE WIRE/FFR STUDY;  Surgeon: Sammy Crisp, MD;  Location: [facility] INVASIVE CV LAB;  Service: Cardiovascular;  Laterality: N/A;   CORONARY STENT INTERVENTION N/A 04/12/2021   Procedure: CORONARY STENT INTERVENTION;  Surgeon: Sammy Crisp, MD;  Location: [facility] INVASIVE CV LAB;  Service: Cardiovascular;  Laterality: N/A;   CORONARY ULTRASOUND/IVUS N/A 04/12/2021    Procedure: Intravascular Ultrasound/IVUS;  Surgeon: Sammy Crisp, MD;  Location: [facility] INVASIVE CV LAB;  Service: Cardiovascular;  Laterality: N/A;   CORONARY/GRAFT ACUTE MI REVASCULARIZATION N/A 10/04/2020   Procedure: Coronary/Graft Acute MI Revascularization;  Surgeon: Sammy Crisp, MD;  Location: [facility] INVASIVE CV LAB;  Service: Cardiovascular;  Laterality: N/A;   HERNIA REPAIR     LEFT HEART CATH AND CORONARY ANGIOGRAPHY N/A 10/04/2020   Procedure: LEFT HEART CATH AND CORONARY ANGIOGRAPHY;  Surgeon: Sammy Crisp, MD;  Location: [facility] INVASIVE CV LAB;  Service: Cardiovascular;  Laterality: N/A;   RIGHT/LEFT HEART CATH AND CORONARY ANGIOGRAPHY N/A 04/12/2021   Procedure: RIGHT/LEFT HEART CATH AND CORONARY ANGIOGRAPHY;  Surgeon: Sammy Crisp, MD;  Location: [facility] INVASIVE CV LAB;  Service: Cardiovascular;  Laterality: N/A;   Patient Active Problem List   Diagnosis Date Noted   Anemia    Leukocytosis    Right arm weakness 01/25/2022   Right sided weakness    Fall at home 01/22/2022   Status post biventricular pacemaker - MRI compatible PPM and leads 01/22/2022   Chronic indwelling Foley catheter 01/22/2022   CKD (chronic kidney disease), stage III - IV (HCC) 06/28/2021   Chronic HFrEF (heart failure with reduced ejection fraction) (HCC) 03/31/2021   Arrhythmia    DM (diabetes mellitus), type 2 (HCC) new 10/26/2020   Urinary retention 10/26/2020   Hyperlipidemia LDL goal <70 10/26/2020   Coronary artery disease of native artery of native heart with stable angina pectoris (HCC) 10/26/2020   S/P angioplasty with stent 10/04/20 DES to LCX  10/26/2020   Ischemic cardiomyopathy 10/26/2020   Diabetes mellitus, type 2 (HCC) 10/06/2020   DOE (dyspnea on exertion)    Meningioma (HCC) 08/14/2020   Vestibular schwannoma (HCC) 08/14/2020   Cardiac arrest with ventricular fibrillation (HCC)    Pain of left hip joint 05/29/2020   Benign brain tumor (HCC)    Chronic kidney disease    History of  pulmonary embolus (PE)    Hypertension    Nonsustained ventricular tachycardia (HCC) 10/11/2019   Dizziness 10/11/2019   Abnormal echocardiogram 06/07/2019   Eustachian tube dysfunction 08/24/2018   Conductive hearing loss of both ears 08/24/2018   Pulmonary hypertension due to thromboembolism (HCC) 03/09/2018   Solitary pulmonary nodule on lung CT 03/08/2018   Ascending aortic aneurysm (HCC) 4.2 cm based on CT from December 2020 01/29/2018   Sinus bradycardia 12/22/2017   Left bundle branch block 12/19/2017   Personal history of pulmonary embolism - no longer on anticoagulation due to pt refusal to take it anymore. 12/19/2017   Chronic kidney disease, stage III (moderate) (HCC) 12/19/2017   Benign neoplasm of brain (HCC) 12/19/2017    PCP: Helyn Lobstein, MD  REFERRING PROVIDER: Helyn Lobstein, MD  REFERRING DIAG: Free Text Diagnosis  R29.6, R26.89, R26.9 Mult. Falls, Poor Balance, Gait Disturbance  THERAPY DIAG:  Unsteadiness on feet  Other lack of coordination  Muscle weakness (generalized)  Difficulty in walking, not elsewhere classified  Repeated falls  Rationale for Evaluation and Treatment: Rehabilitation  ONSET DATE: chronic   SUBJECTIVE:   SUBJECTIVE STATEMENT:  Not good, more pain in the hip and back, "all of my joints seem to be in bad shape" No issues before the fall a couple years ago   EVAL: Having issues with balance and walking. I do have a meningioma in my spine, and every once in awhile I have pain if I twist or stress it. Still having pain in my neck and hip from a fall that I had about a year and a half ago. Thought that pain would go away but it hasn't even this far out/being this healed. I have a lot of unsteadiness, not sure I'd call it dizziness but I have to stand for a minute before I get going. Have a couple of walking sticks/canes but not sure how to use them honestly. Entire R LE and UE are  still numb. When I fell I was at a friend's house and  were leaving, about an hour before that I had a glass of wine, I went out first and held the door for the next people but didn't realize that the concrete platform ended and dropped about 15 inches and ended up stepping off that 15 inch ledge and hit my head/shoulder/hip/knee on the wall beyond that. Meningioma is still just being monitored, saw the MD and they said its not growing but its just there in the spine. Every single movement I make with my neck hurts when I get to the end of that range.   PERTINENT HISTORY: See above- complex medical hx  PAIN:  Are you having pain? No 0/10 at rest, twisting and bending hurts the back   PRECAUTIONS: Fall and Other: caution with movements, some movements can cause increased pressure/pain at meningioma site, ICD    RED FLAGS: None   WEIGHT BEARING RESTRICTIONS: No  FALLS:  Has patient fallen in last 6 months? Yes. Number of falls 1- had a mis-step coming off a ladder, fell backwards and hit the floor pretty hard  LIVING ENVIRONMENT: Lives with: lives with their spouse Lives in: House/apartment Stairs:  5 STE with B rails, has second floor in his home but holds rail    OCCUPATION: retired   PLOF: Independent, Independent with basic ADLs, Independent with gait, and Independent with transfers  PATIENT GOALS: getting more active, improving balance   NEXT MD VISIT: Referring scheduled but unsure when   OBJECTIVE:  Note: Objective measures were completed at Evaluation unless otherwise noted.    PATIENT SURVEYS:  ABC scale 65.6%; 12/13/23 65%  COGNITION: Overall cognitive status: Within functional limits for tasks assessed     SENSATION: Complete R UE and R LE numbness (chronic)    COORDINATION  Noted some ataxia in R LE with functional movements   LOWER EXTREMITY MMT:  MMT Right eval Left eval  Hip flexion 4+ 5  Hip extension    Hip abduction 5 5  Hip adduction    Hip internal rotation    Hip external rotation    Knee  flexion 5 5  Knee extension 5 5  Ankle dorsiflexion 5 5  Ankle plantarflexion    Ankle inversion    Ankle eversion     (Blank rows = not tested)  FUNCTIONAL TESTS:  5 times sit to stand: 15.4 seconds use of UEs, some difficulty stabilizing; 11/20/23 15.2 seconds use of UEs  Timed up and go (TUG): 14.2 seconds no device but did need MinA for balance when initially standing and then turning to sit in the chair at end of test; 11/20/23 10 seconds  3 minute walk test: 664ft no device but needed MinA at times for balance on corners, RPE 4/10; 12/13/23 565ft no device distant S, RPE 3/10     12/13/23 0001  Standardized Balance Assessment  Standardized Balance Assessment Dynamic Gait Index  Dynamic Gait Index  Level Surface 2  Change in Gait Speed 2  Gait with Horizontal Head Turns 2  Gait with Vertical Head Turns 2  Gait and Pivot Turn 1  Step Over Obstacle 1  Step Around Obstacles 1  Steps 0  Total Score 11      GAIT: Distance walked: 63ft Assistive device utilized: None Level of assistance: Min A Comments: MinA for balance with turns and when navigating obstacles, some ataxic movements noted                                                                                                                                 TREATMENT DATE:  12/26/23 Bike L 3 x 7 min HS curls 25lb 2x15  Sides steps at [employer] set with standing marches very unstable  Alt 4in box taps  Standing ball toss  On airex  Eyes closed   12/21/23  Hooklying TA sets 15x3 seconds  PPT 15x3 seconds TA set + alternating march from hooklying x20  Reviewed and enforced BLT precautions for his back until MRI can clarify what is happening- he often pushes through pain and encouraged him not to do this to avoid worsening of condition    Nustep L3x8 minutes (dropped resistance due to R knee pain) BLEs only  One foot on BOSU/one foot on solid ground 3x30 seconds (harder for him  today)   12/19/23 NuStep L 5 x 7 min seat back for decrease knee flex  Alt 4in box taps 2x10 Side step at mat table  Sit to stand LE on airex elevated surface some UE use 2x10 On airex reaching outside [employer]. Ball kicks   Combo w/ toss and kicks Pball roll outs  Heel raises  12/13/23  Nustep x6 minutes L5 limited by knee pain today  Wide tandem on blue foam pad 3x30 seconds B Lateral toe taps off blue foam pad (alternating) x20 total  Backward alternating toe taps off blue foam pad x10 total   ABC, objectives as above, POC updates, education on progress with PT and plan moving forward, encouraged getting lx spine w/u due to increasing numbness L LE along with lx symptoms having started    12/12/23  Nustep L5 x10 minutes BLEs only for LE endurance, functional activity tolerance  Wide tandem stance one  foot on blue foam/one foot on air pad 3x30 seconds each  Clearance up and over double cone x10 B, up to MinA for balance  SLS with one foot on blue wted ball 3x30 seconds B up to MinA for balance  Forward/backward ball rolls x2 rounds each side, light BUE support, up to MinA for balance  Sideways marches x4 laps in // bars up to light MinA    12/07/23 NuStep L5 x 6 min HS curls 25lb 2x10 Leg Ext 10lb 2x10 Alt 4in box taps 2x10 On airex reaching outside base of support Ball toss 2x10 Side steps at mat table Standing march  Shoulder Ext 10lb 2x10     PATIENT EDUCATION:  Education details: exam findings, POC Person educated: Patient Education method: Explanation, Demonstration, and Verbal cues Education comprehension: verbalized understanding, returned demonstration, and needs further education  HOME EXERCISE PROGRAM:  Access Code: [email or web address] URL: [email or web address] Date: 12/21/2023 Prepared by: Nedra Hai  Exercises - Wide Tandem Stance with Eyes Open  - 1 x daily - 7 x weekly - 2 sets - 3 reps - 30 seconds  hold - Romberg Stance  - 1  x daily - 7 x weekly - 2 sets - 3 reps - 30 second  hold - Single Leg Stance with Support  - 1 x daily - 7 x weekly - 2 sets - 3 reps - Supine Transversus Abdominis Bracing - Hands on Stomach  - 5 x daily - 7 x weekly - 1 sets - 10 reps - 3 seconds  hold - Supine Posterior Pelvic Tilt  - 2 x daily - 7 x weekly - 1 sets - 10 reps - 3 seconds  hold - Supine March  - 1 x daily - 7 x weekly - 3 sets - 10 reps    ASSESSMENT:  CLINICAL IMPRESSION:   Again pt arrives today with worsening back pain and instability with mobility.  worked on functional interventions that challenged balance. Instability present throughout session with all standing activities. Instability wi worst with SLS and eyes closed. No update with MRI results.  EVAL: Patient is a 78 y.o. M who was seen today for physical therapy evaluation and treatment for Free Text Diagnosis R29.6, R26.89, R26.9 Mult. Falls, Poor Balance, Gait Disturbance. Very pleasant and cooperative but very talkative during eval which did limit assessment time. Still having a lot of pain in general since his fall  and ongoing numbness in R UE/LE. Gait mechanics and balance are definitely impaired, would benefit from training with appropriate AD if he is open to this. Will benefit from skilled PT services to address his concerns and functional objective impairments to the best of our ability.   OBJECTIVE IMPAIRMENTS: Abnormal gait, cardiopulmonary status limiting activity, decreased activity tolerance, decreased balance, decreased mobility, and difficulty walking.   ACTIVITY LIMITATIONS: standing, squatting, stairs, transfers, reach over head, and locomotion level  PARTICIPATION LIMITATIONS: driving, shopping, community activity, and occupation  PERSONAL FACTORS: Age, Behavior pattern, Fitness, Past/current experiences, Social background, and Time since onset of injury/illness/exacerbation are also affecting patient's functional outcome.   REHAB POTENTIAL:  Good  CLINICAL DECISION MAKING: Evolving/moderate complexity  EVALUATION COMPLEXITY: Moderate   GOALS: Goals reviewed with patient? No  SHORT TERM GOALS: Target date: 11/23/2023   Will be compliant with appropriate progressive HEP  Baseline: Goal status: MET 11/23/23  2.  Will be able to name 3 ways to prevent falls at home and in the community  Baseline:  Goal status: MET 11/23/23  3.  Will be able to perform 5xSTS and with distant S Baseline: MinA  Goal status: 12/13/23 MET   4.  Will complete TUG test in 12 seconds or less S  Baseline: MinA  Goal status: MET 11/20/23    LONG TERM GOALS: Target date: 01/10/2024      Will score at least 18 on DGI to show improved functional balance  Baseline:  Goal status: ONGOING 12/13/23 DGI 11/24  2.  Will be able to ambulate community distances (level and inclines) no device, RPE no more than 2/10  Baseline:  Goal status: ONGOING 12/13/23 (RPE 6/10)  3.  Will be able to perform all desired gym activities without increased pain from resting levels  Baseline:  Goal status: ONGOING 12/13/23 improving   4.  ABC score to improve by at least 20 points to show improved subjective status  Baseline:  Goal status: ONGOING 12/13/23    PLAN:  PT FREQUENCY: 2x/week  PT DURATION: 4 weeks  PLANNED INTERVENTIONS: [id number]- PT Re-evaluation, 97110-Therapeutic exercises, 97530- Therapeutic activity, 97112- Neuromuscular re-education, 97535- Self Care, [id number]- Manual therapy, [id number]- Gait training, [phone]- Aquatic Therapy, and Balance training  PLAN FOR NEXT SESSION: balance and coordination progressions with compliant surfaces , functional activity tolerance, work on some quad strength and general LE endurance. Be careful with his back, monitor increasing L LE numbness- recommend BLT precautions for now given severity of pain/pattern  Towanda Fret, PTA 12/26/23 10:55 AM

## 2023-12-28 ENCOUNTER — Ambulatory Visit: Admitting: Physical Therapy

## 2023-12-28 ENCOUNTER — Ambulatory Visit

## 2023-12-28 DIAGNOSIS — R296 Repeated falls: Secondary | ICD-10-CM

## 2023-12-28 DIAGNOSIS — R2681 Unsteadiness on feet: Secondary | ICD-10-CM

## 2023-12-28 DIAGNOSIS — R262 Difficulty in walking, not elsewhere classified: Secondary | ICD-10-CM | POA: Diagnosis not present

## 2023-12-28 DIAGNOSIS — R531 Weakness: Secondary | ICD-10-CM | POA: Diagnosis not present

## 2023-12-28 DIAGNOSIS — M6281 Muscle weakness (generalized): Secondary | ICD-10-CM

## 2023-12-28 DIAGNOSIS — R278 Other lack of coordination: Secondary | ICD-10-CM | POA: Diagnosis not present

## 2023-12-28 NOTE — Therapy (Signed)
 OUTPATIENT PHYSICAL THERAPY LOWER EXTREMITY TREATMENT  Patient Name: Timothy Moran MRN: [id number] DOB:[birth year], 78 y.o., male Today's Date: 12/28/2023      END OF SESSION:  PT End of Session - 12/28/23 0930     Visit Number 14    Date for PT Re-Evaluation 01/10/24    PT Start Time 0930    PT Stop Time 1015    PT Time Calculation (min) 45 min    Activity Tolerance Patient tolerated treatment well    Behavior During Therapy [facility] for tasks assessed/performed                    Past Medical History:  Diagnosis Date   Acute blood loss anemia 10/05/2020   Acute respiratory failure (HCC), hypothermia therapy, vent - extubated 10/06/20    AKI (acute kidney injury) (HCC) 10/05/2020   Anoxic brain injury (HCC) 10/26/2020   Arrhythmia    Ascending aortic aneurysm (HCC) 01/29/2018   a. 2019 4.3 cm by echo; b. 02/2021 Echo: Ao root 40mm.   Benign brain tumor (HCC)    Benign neoplasm of brain (HCC) 12/19/2017   Cardiac arrest with ventricular fibrillation (HCC)    Chest pain 12/04/2017   Chronic kidney disease, stage III (moderate) (HCC) 12/19/2017   CKD (chronic kidney disease), stage III - IV (HCC)    Conductive hearing loss of both ears 08/24/2018   Coronary Artery Disease    a. 09/2020 Inf STEMI complicated by cardiac arrest/PCI: LAD 50p/m, LCX 100d (2.5 x 30 Resolute Onyx DES); b. 04/2021 PCI: 04/2021 LM nl, LAD 50p/43m (2.5x30 Onyx Frontier DES), LCX 25p, 47m (RFR 0.98), 40d ISR (RFR 0.98), OM2 25, RCA mild diff dzs.   Diabetes mellitus, type 2 (HCC) 10/06/2020   10/06/20 A1C 7%   Dizziness 10/11/2019   Encounter for imaging study to confirm orogastric (OG) tube placement    Eustachian tube dysfunction 08/24/2018   Hematuria 10/26/2020   HFrEF (heart failure with reduced ejection fraction) (HCC)    a. 11/2020 Echo: EF 25-30%; b. 02/2021 Echo: EF 25-30%, glob HK. Mild LVH. Nl RV fxn. Triv AI. Ao root 40mm.   History of pulmonary embolus (PE)    HLD (hyperlipidemia)  10/26/2020   Hypertension    Iron deficiency anemia rec'd IV iron 10/26/2020   Ischemic cardiomyopathy    a. 11/2020 Echo: EF 25-30%; b. 02/2021 Echo: EF 25-30%, glob HK.   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 Marland KitchenSpirometry 03/08/2018    FEV1 3.86 (108%)  Ratio 96 s prior rx  - PET  03/13/18   Low grade c/w adenoca > rec T surgery eval     STEMI (ST elevation myocardial infarction) (HCC) 10/04/2020   Urinary retention 10/26/2020   Vestibular schwannoma (HCC) 08/14/2020   Past Surgical History:  Procedure Laterality Date  APPENDECTOMY     BIV ICD INSERTION CRT-D N/A 11/03/2021   Procedure: BIV ICD INSERTION CRT-D;  Surgeon: Lei Pump, MD;  Location: [facility] INVASIVE CV LAB;  Service: Cardiovascular;  Laterality: N/A;   CARDIAC CATHETERIZATION     CORONARY ANGIOPLASTY WITH STENT PLACEMENT Left 04/12/2021   LAD stent placement   CORONARY PRESSURE/FFR STUDY N/A 04/12/2021   Procedure: INTRAVASCULAR PRESSURE WIRE/FFR STUDY;  Surgeon: Sammy Crisp, MD;  Location: [facility] INVASIVE CV LAB;  Service: Cardiovascular;  Laterality: N/A;   CORONARY STENT INTERVENTION N/A 04/12/2021   Procedure: CORONARY STENT INTERVENTION;  Surgeon: Sammy Crisp, MD;  Location: [facility] INVASIVE CV LAB;  Service: Cardiovascular;  Laterality: N/A;   CORONARY ULTRASOUND/IVUS N/A 04/12/2021    Procedure: Intravascular Ultrasound/IVUS;  Surgeon: Sammy Crisp, MD;  Location: [facility] INVASIVE CV LAB;  Service: Cardiovascular;  Laterality: N/A;   CORONARY/GRAFT ACUTE MI REVASCULARIZATION N/A 10/04/2020   Procedure: Coronary/Graft Acute MI Revascularization;  Surgeon: Sammy Crisp, MD;  Location: [facility] INVASIVE CV LAB;  Service: Cardiovascular;  Laterality: N/A;   HERNIA REPAIR     LEFT HEART CATH AND CORONARY ANGIOGRAPHY N/A 10/04/2020   Procedure: LEFT HEART CATH AND CORONARY ANGIOGRAPHY;  Surgeon: Sammy Crisp, MD;  Location: [facility] INVASIVE CV LAB;  Service: Cardiovascular;  Laterality: N/A;   RIGHT/LEFT HEART CATH AND CORONARY ANGIOGRAPHY N/A 04/12/2021   Procedure: RIGHT/LEFT HEART CATH AND CORONARY ANGIOGRAPHY;  Surgeon: Sammy Crisp, MD;  Location: [facility] INVASIVE CV LAB;  Service: Cardiovascular;  Laterality: N/A;   Patient Active Problem List   Diagnosis Date Noted   Anemia    Leukocytosis    Right arm weakness 01/25/2022   Right sided weakness    Fall at home 01/22/2022   Status post biventricular pacemaker - MRI compatible PPM and leads 01/22/2022   Chronic indwelling Foley catheter 01/22/2022   CKD (chronic kidney disease), stage III - IV (HCC) 06/28/2021   Chronic HFrEF (heart failure with reduced ejection fraction) (HCC) 03/31/2021   Arrhythmia    DM (diabetes mellitus), type 2 (HCC) new 10/26/2020   Urinary retention 10/26/2020   Hyperlipidemia LDL goal <70 10/26/2020   Coronary artery disease of native artery of native heart with stable angina pectoris (HCC) 10/26/2020   S/P angioplasty with stent 10/04/20 DES to LCX  10/26/2020   Ischemic cardiomyopathy 10/26/2020   Diabetes mellitus, type 2 (HCC) 10/06/2020   DOE (dyspnea on exertion)    Meningioma (HCC) 08/14/2020   Vestibular schwannoma (HCC) 08/14/2020   Cardiac arrest with ventricular fibrillation (HCC)    Pain of left hip joint 05/29/2020   Benign brain tumor (HCC)    Chronic kidney disease    History of  pulmonary embolus (PE)    Hypertension    Nonsustained ventricular tachycardia (HCC) 10/11/2019   Dizziness 10/11/2019   Abnormal echocardiogram 06/07/2019   Eustachian tube dysfunction 08/24/2018   Conductive hearing loss of both ears 08/24/2018   Pulmonary hypertension due to thromboembolism (HCC) 03/09/2018   Solitary pulmonary nodule on lung CT 03/08/2018   Ascending aortic aneurysm (HCC) 4.2 cm based on CT from December 2020 01/29/2018   Sinus bradycardia 12/22/2017   Left bundle branch block 12/19/2017   Personal history of pulmonary embolism - no longer on anticoagulation due to pt refusal to take it anymore. 12/19/2017   Chronic kidney disease, stage III (moderate) (HCC) 12/19/2017   Benign neoplasm of brain (HCC) 12/19/2017    PCP: Helyn Lobstein, MD  REFERRING PROVIDER: Helyn Lobstein, MD  REFERRING DIAG: Free Text Diagnosis  R29.6, R26.89, R26.9 Mult. Falls, Poor Balance, Gait Disturbance  THERAPY DIAG:  Unsteadiness on feet  Muscle weakness (generalized)  Other lack of coordination  Difficulty in walking, not elsewhere classified  Repeated falls  Rationale for Evaluation and Treatment: Rehabilitation  ONSET DATE: chronic   SUBJECTIVE:   SUBJECTIVE STATEMENT: Back is hurting. No falls.    EVAL: Having issues with balance and walking. I do have a meningioma in my spine, and every once in awhile I have pain if I twist or stress it. Still having pain in my neck and hip from a fall that I had about a year and a half ago. Thought that pain would go away but it hasn't even this far out/being this healed. I have a lot of unsteadiness, not sure I'd call it dizziness but I have to stand for a minute before I get going. Have a couple of walking sticks/canes but not sure how to use them honestly. Entire R LE and UE are  still numb. When I fell I was at a friend's house and were leaving, about an hour before that I had a glass of wine, I went out first and held the door for  the next people but didn't realize that the concrete platform ended and dropped about 15 inches and ended up stepping off that 15 inch ledge and hit my head/shoulder/hip/knee on the wall beyond that. Meningioma is still just being monitored, saw the MD and they said its not growing but its just there in the spine. Every single movement I make with my neck hurts when I get to the end of that range.   PERTINENT HISTORY: See above- complex medical hx  PAIN:  Are you having pain? No 0/10 at rest, twisting and bending hurts the back   PRECAUTIONS: Fall and Other: caution with movements, some movements can cause increased pressure/pain at meningioma site, ICD    RED FLAGS: None   WEIGHT BEARING RESTRICTIONS: No  FALLS:  Has patient fallen in last 6 months? Yes. Number of falls 1- had a mis-step coming off a ladder, fell backwards and hit the floor pretty hard  LIVING ENVIRONMENT: Lives with: lives with their spouse Lives in: House/apartment Stairs:  5 STE with B rails, has second floor in his home but holds rail    OCCUPATION: retired   PLOF: Independent, Independent with basic ADLs, Independent with gait, and Independent with transfers  PATIENT GOALS: getting more active, improving balance   NEXT MD VISIT: Referring scheduled but unsure when   OBJECTIVE:  Note: Objective measures were completed at Evaluation unless otherwise noted.    PATIENT SURVEYS:  ABC scale 65.6%; 12/13/23 65%  COGNITION: Overall cognitive status: Within functional limits for tasks assessed     SENSATION: Complete R UE and R LE numbness (chronic)    COORDINATION  Noted some ataxia in R LE with functional movements   LOWER EXTREMITY MMT:  MMT Right eval Left eval  Hip flexion 4+ 5  Hip extension    Hip abduction 5 5  Hip adduction    Hip internal rotation    Hip external rotation    Knee flexion 5 5  Knee extension 5 5  Ankle dorsiflexion 5 5  Ankle plantarflexion    Ankle inversion     Ankle eversion     (Blank rows = not tested)   FUNCTIONAL TESTS:  5 times sit to stand: 15.4 seconds use of UEs, some difficulty stabilizing; 11/20/23 15.2 seconds use of  UEs  Timed up and go (TUG): 14.2 seconds no device but did need MinA for balance when initially standing and then turning to sit in the chair at end of test; 11/20/23 10 seconds  3 minute walk test: 663ft no device but needed MinA at times for balance on corners, RPE 4/10; 12/13/23 535ft no device distant S, RPE 3/10     12/13/23 0001  Standardized Balance Assessment  Standardized Balance Assessment Dynamic Gait Index  Dynamic Gait Index  Level Surface 2  Change in Gait Speed 2  Gait with Horizontal Head Turns 2  Gait with Vertical Head Turns 2  Gait and Pivot Turn 1  Step Over Obstacle 1  Step Around Obstacles 1  Steps 0  Total Score 11      GAIT: Distance walked: 691ft Assistive device utilized: None Level of assistance: Min A Comments: MinA for balance with turns and when navigating obstacles, some ataxic movements noted                                                                                                                                 TREATMENT DATE:  12/28/23 Bike L3 x55mins  HS curls 25# 2x12 Leg ext 10# 2x12 Side steps by counter  Standing march requires light UE support  Standing rows and ext green band 2x10 STS 2x10 from elevated mat table  Ball toss 2x10   12/26/23 Bike L 3 x 7 min HS curls 25lb 2x15  Sides steps at [employer] set with standing marches very unstable  Alt 4in box taps  Standing ball toss  On airex  Eyes closed   12/21/23  Hooklying TA sets 15x3 seconds  PPT 15x3 seconds TA set + alternating march from hooklying x20  Reviewed and enforced BLT precautions for his back until MRI can clarify what is happening- he often pushes through pain and encouraged him not to do this to avoid worsening of condition    Nustep L3x8 minutes (dropped resistance due  to R knee pain) BLEs only  One foot on BOSU/one foot on solid ground 3x30 seconds (harder for him today)   12/19/23 NuStep L 5 x 7 min seat back for decrease knee flex  Alt 4in box taps 2x10 Side step at mat table  Sit to stand LE on airex elevated surface some UE use 2x10 On airex reaching outside [employer]. Ball kicks   Combo w/ toss and kicks Pball roll outs  Heel raises  12/13/23  Nustep x6 minutes L5 limited by knee pain today  Wide tandem on blue foam pad 3x30 seconds B Lateral toe taps off blue foam pad (alternating) x20 total  Backward alternating toe taps off blue foam pad x10 total   ABC, objectives as above, POC updates, education on progress with PT and plan moving forward, encouraged getting lx spine w/u due to increasing numbness L LE along with lx symptoms having  started    12/12/23  Nustep L5 x10 minutes BLEs only for LE endurance, functional activity tolerance  Wide tandem stance one foot on blue foam/one foot on air pad 3x30 seconds each  Clearance up and over double cone x10 B, up to MinA for balance  SLS with one foot on blue wted ball 3x30 seconds B up to MinA for balance  Forward/backward ball rolls x2 rounds each side, light BUE support, up to MinA for balance  Sideways marches x4 laps in // bars up to light MinA    12/07/23 NuStep L5 x 6 min HS curls 25lb 2x10 Leg Ext 10lb 2x10 Alt 4in box taps 2x10 On airex reaching outside base of support Ball toss 2x10 Side steps at mat table Standing march  Shoulder Ext 10lb 2x10     PATIENT EDUCATION:  Education details: exam findings, POC Person educated: Patient Education method: Explanation, Demonstration, and Verbal cues Education comprehension: verbalized understanding, returned demonstration, and needs further education  HOME EXERCISE PROGRAM:  Access Code: [email or web address] URL: [email or web address] Date: 12/21/2023 Prepared by: Nedra Hai  Exercises - Wide Tandem Stance  with Eyes Open  - 1 x daily - 7 x weekly - 2 sets - 3 reps - 30 seconds  hold - Romberg Stance  - 1 x daily - 7 x weekly - 2 sets - 3 reps - 30 second  hold - Single Leg Stance with Support  - 1 x daily - 7 x weekly - 2 sets - 3 reps - Supine Transversus Abdominis Bracing - Hands on Stomach  - 5 x daily - 7 x weekly - 1 sets - 10 reps - 3 seconds  hold - Supine Posterior Pelvic Tilt  - 2 x daily - 7 x weekly - 1 sets - 10 reps - 3 seconds  hold - Supine March  - 1 x daily - 7 x weekly - 3 sets - 10 reps    ASSESSMENT:  CLINICAL IMPRESSION: Again pt arrives today with worsening back pain and instability with mobility.  Worked on functional interventions and strengthened. Instability present throughout session with just about all standing activities. STS were a little unsteady but he gets better with more reps. Does well will standing ball toss, no LOB.    EVAL: Patient is a 78 y.o. M who was seen today for physical therapy evaluation and treatment for Free Text Diagnosis R29.6, R26.89, R26.9 Mult. Falls, Poor Balance, Gait Disturbance. Very pleasant and cooperative but very talkative during eval which did limit assessment time. Still having a lot of pain in general since his fall  and ongoing numbness in R UE/LE. Gait mechanics and balance are definitely impaired, would benefit from training with appropriate AD if he is open to this. Will benefit from skilled PT services to address his concerns and functional objective impairments to the best of our ability.   OBJECTIVE IMPAIRMENTS: Abnormal gait, cardiopulmonary status limiting activity, decreased activity tolerance, decreased balance, decreased mobility, and difficulty walking.   ACTIVITY LIMITATIONS: standing, squatting, stairs, transfers, reach over head, and locomotion level  PARTICIPATION LIMITATIONS: driving, shopping, community activity, and occupation  PERSONAL FACTORS: Age, Behavior pattern, Fitness, Past/current experiences, Social  background, and Time since onset of injury/illness/exacerbation are also affecting patient's functional outcome.   REHAB POTENTIAL: Good  CLINICAL DECISION MAKING: Evolving/moderate complexity  EVALUATION COMPLEXITY: Moderate   GOALS: Goals reviewed with patient? No  SHORT TERM GOALS: Target date: 11/23/2023   Will be compliant with appropriate progressive HEP  Baseline:  Goal status: MET 11/23/23  2.  Will be able to name 3 ways to prevent falls at home and in the community  Baseline:  Goal status: MET 11/23/23  3.  Will be able to perform 5xSTS and with distant S Baseline: MinA  Goal status: 12/13/23 MET   4.  Will complete TUG test in 12 seconds or less S  Baseline: MinA  Goal status: MET 11/20/23    LONG TERM GOALS: Target date: 01/10/2024      Will score at least 18 on DGI to show improved functional balance  Baseline:  Goal status: ONGOING 12/13/23 DGI 11/24  2.  Will be able to ambulate community distances (level and inclines) no device, RPE no more than 2/10  Baseline:  Goal status: ONGOING 12/13/23 (RPE 6/10)  3.  Will be able to perform all desired gym activities without increased pain from resting levels  Baseline:  Goal status: ONGOING 12/13/23 improving   4.  ABC score to improve by at least 20 points to show improved subjective status  Baseline:  Goal status: ONGOING 12/13/23    PLAN:  PT FREQUENCY: 2x/week  PT DURATION: 4 weeks  PLANNED INTERVENTIONS: 97164- PT Re-evaluation, 97110-Therapeutic exercises, 97530- Therapeutic activity, 97112- Neuromuscular re-education, 97535- Self Care, [id number]- Manual therapy, [id number]- Gait training, [phone]- Aquatic Therapy, and Balance training  PLAN FOR NEXT SESSION: balance and coordination progressions with compliant surfaces , functional activity tolerance, work on some quad strength and general LE endurance. Be careful with his back, monitor increasing L LE numbness- recommend BLT precautions for now given severity  of pain/pattern  Towanda Fret, PTA 12/28/23 10:14 AM

## 2024-01-02 ENCOUNTER — Ambulatory Visit

## 2024-01-02 DIAGNOSIS — M6281 Muscle weakness (generalized): Secondary | ICD-10-CM | POA: Diagnosis not present

## 2024-01-02 DIAGNOSIS — R278 Other lack of coordination: Secondary | ICD-10-CM | POA: Diagnosis not present

## 2024-01-02 DIAGNOSIS — R262 Difficulty in walking, not elsewhere classified: Secondary | ICD-10-CM

## 2024-01-02 DIAGNOSIS — R2681 Unsteadiness on feet: Secondary | ICD-10-CM

## 2024-01-02 DIAGNOSIS — R296 Repeated falls: Secondary | ICD-10-CM | POA: Diagnosis not present

## 2024-01-02 DIAGNOSIS — R531 Weakness: Secondary | ICD-10-CM | POA: Diagnosis not present

## 2024-01-02 NOTE — Therapy (Signed)
 OUTPATIENT PHYSICAL THERAPY LOWER EXTREMITY TREATMENT  Patient Name: Amontae Ng MRN: [id number] DOB:[birth year], 78 y.o., male Today's Date: 01/02/2024      END OF SESSION:  PT End of Session - 01/02/24 1020     Visit Number 15    Date for PT Re-Evaluation 01/10/24    PT Start Time 1015    PT Stop Time 1100    PT Time Calculation (min) 45 min    Activity Tolerance Patient tolerated treatment well    Behavior During Therapy [facility] for tasks assessed/performed                     Past Medical History:  Diagnosis Date   Acute blood loss anemia 10/05/2020   Acute respiratory failure (HCC), hypothermia therapy, vent - extubated 10/06/20    AKI (acute kidney injury) (HCC) 10/05/2020   Anoxic brain injury (HCC) 10/26/2020   Arrhythmia    Ascending aortic aneurysm (HCC) 01/29/2018   a. 2019 4.3 cm by echo; b. 02/2021 Echo: Ao root 40mm.   Benign brain tumor (HCC)    Benign neoplasm of brain (HCC) 12/19/2017   Cardiac arrest with ventricular fibrillation (HCC)    Chest pain 12/04/2017   Chronic kidney disease, stage III (moderate) (HCC) 12/19/2017   CKD (chronic kidney disease), stage III - IV (HCC)    Conductive hearing loss of both ears 08/24/2018   Coronary Artery Disease    a. 09/2020 Inf STEMI complicated by cardiac arrest/PCI: LAD 50p/m, LCX 100d (2.5 x 30 Resolute Onyx DES); b. 04/2021 PCI: 04/2021 LM nl, LAD 50p/2m (2.5x30 Onyx Frontier DES), LCX 25p, 33m (RFR 0.98), 40d ISR (RFR 0.98), OM2 25, RCA mild diff dzs.   Diabetes mellitus, type 2 (HCC) 10/06/2020   10/06/20 A1C 7%   Dizziness 10/11/2019   Encounter for imaging study to confirm orogastric (OG) tube placement    Eustachian tube dysfunction 08/24/2018   Hematuria 10/26/2020   HFrEF (heart failure with reduced ejection fraction) (HCC)    a. 11/2020 Echo: EF 25-30%; b. 02/2021 Echo: EF 25-30%, glob HK. Mild LVH. Nl RV fxn. Triv AI. Ao root 40mm.   History of pulmonary embolus (PE)    HLD (hyperlipidemia)  10/26/2020   Hypertension    Iron deficiency anemia rec'd IV iron 10/26/2020   Ischemic cardiomyopathy    a. 11/2020 Echo: EF 25-30%; b. 02/2021 Echo: EF 25-30%, glob HK.   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 Aaron AasSpirometry 03/08/2018    FEV1 3.86 (108%)  Ratio 96 s prior rx  - PET  03/13/18   Low grade c/w adenoca > rec T surgery eval     STEMI (ST elevation myocardial infarction) (HCC) 10/04/2020   Urinary retention 10/26/2020   Vestibular schwannoma (HCC) 08/14/2020   Past Surgical History:  Procedure Laterality  Date   APPENDECTOMY     BIV ICD INSERTION CRT-D N/A 11/03/2021   Procedure: BIV ICD INSERTION CRT-D;  Surgeon: Lei Pump, MD;  Location: [facility] INVASIVE CV LAB;  Service: Cardiovascular;  Laterality: N/A;   CARDIAC CATHETERIZATION     CORONARY ANGIOPLASTY WITH STENT PLACEMENT Left 04/12/2021   LAD stent placement   CORONARY PRESSURE/FFR STUDY N/A 04/12/2021   Procedure: INTRAVASCULAR PRESSURE WIRE/FFR STUDY;  Surgeon: Sammy Crisp, MD;  Location: [facility] INVASIVE CV LAB;  Service: Cardiovascular;  Laterality: N/A;   CORONARY STENT INTERVENTION N/A 04/12/2021   Procedure: CORONARY STENT INTERVENTION;  Surgeon: Sammy Crisp, MD;  Location: [facility] INVASIVE CV LAB;  Service: Cardiovascular;  Laterality: N/A;   CORONARY ULTRASOUND/IVUS N/A 04/12/2021    Procedure: Intravascular Ultrasound/IVUS;  Surgeon: Sammy Crisp, MD;  Location: [facility] INVASIVE CV LAB;  Service: Cardiovascular;  Laterality: N/A;   CORONARY/GRAFT ACUTE MI REVASCULARIZATION N/A 10/04/2020   Procedure: Coronary/Graft Acute MI Revascularization;  Surgeon: Sammy Crisp, MD;  Location: [facility] INVASIVE CV LAB;  Service: Cardiovascular;  Laterality: N/A;   HERNIA REPAIR     LEFT HEART CATH AND CORONARY ANGIOGRAPHY N/A 10/04/2020   Procedure: LEFT HEART CATH AND CORONARY ANGIOGRAPHY;  Surgeon: Sammy Crisp, MD;  Location: [facility] INVASIVE CV LAB;  Service: Cardiovascular;  Laterality: N/A;   RIGHT/LEFT HEART CATH AND CORONARY ANGIOGRAPHY N/A 04/12/2021   Procedure: RIGHT/LEFT HEART CATH AND CORONARY ANGIOGRAPHY;  Surgeon: Sammy Crisp, MD;  Location: [facility] INVASIVE CV LAB;  Service: Cardiovascular;  Laterality: N/A;   Patient Active Problem List   Diagnosis Date Noted   Anemia    Leukocytosis    Right arm weakness 01/25/2022   Right sided weakness    Fall at home 01/22/2022   Status post biventricular pacemaker - MRI compatible PPM and leads 01/22/2022   Chronic indwelling Foley catheter 01/22/2022   CKD (chronic kidney disease), stage III - IV (HCC) 06/28/2021   Chronic HFrEF (heart failure with reduced ejection fraction) (HCC) 03/31/2021   Arrhythmia    DM (diabetes mellitus), type 2 (HCC) new 10/26/2020   Urinary retention 10/26/2020   Hyperlipidemia LDL goal <70 10/26/2020   Coronary artery disease of native artery of native heart with stable angina pectoris (HCC) 10/26/2020   S/P angioplasty with stent 10/04/20 DES to LCX  10/26/2020   Ischemic cardiomyopathy 10/26/2020   Diabetes mellitus, type 2 (HCC) 10/06/2020   DOE (dyspnea on exertion)    Meningioma (HCC) 08/14/2020   Vestibular schwannoma (HCC) 08/14/2020   Cardiac arrest with ventricular fibrillation (HCC)    Pain of left hip joint 05/29/2020   Benign brain tumor (HCC)    Chronic kidney disease    History of  pulmonary embolus (PE)    Hypertension    Nonsustained ventricular tachycardia (HCC) 10/11/2019   Dizziness 10/11/2019   Abnormal echocardiogram 06/07/2019   Eustachian tube dysfunction 08/24/2018   Conductive hearing loss of both ears 08/24/2018   Pulmonary hypertension due to thromboembolism (HCC) 03/09/2018   Solitary pulmonary nodule on lung CT 03/08/2018   Ascending aortic aneurysm (HCC) 4.2 cm based on CT from December 2020 01/29/2018   Sinus bradycardia 12/22/2017   Left bundle branch block 12/19/2017   Personal history of pulmonary embolism - no longer on anticoagulation due to pt refusal to take it anymore. 12/19/2017   Chronic kidney disease, stage III (moderate) (HCC) 12/19/2017   Benign neoplasm of brain (HCC) 12/19/2017    PCP: Helyn Lobstein, MD  REFERRING PROVIDER: Helyn Lobstein, MD  REFERRING DIAG:  Free Text Diagnosis R29.6, R26.89, R26.9 Mult. Falls, Poor Balance, Gait Disturbance  THERAPY DIAG:  Unsteadiness on feet  Muscle weakness (generalized)  Other lack of coordination  Difficulty in walking, not elsewhere classified  Rationale for Evaluation and Treatment: Rehabilitation  ONSET DATE: chronic   SUBJECTIVE:   SUBJECTIVE STATEMENT: Back is hurting. No falls.    EVAL: Having issues with balance and walking. I do have a meningioma in my spine, and every once in awhile I have pain if I twist or stress it. Still having pain in my neck and hip from a fall that I had about a year and a half ago. Thought that pain would go away but it hasn't even this far out/being this healed. I have a lot of unsteadiness, not sure I'd call it dizziness but I have to stand for a minute before I get going. Have a couple of walking sticks/canes but not sure how to use them honestly. Entire R LE and UE are  still numb. When I fell I was at a friend's house and were leaving, about an hour before that I had a glass of wine, I went out first and held the door for the next people  but didn't realize that the concrete platform ended and dropped about 15 inches and ended up stepping off that 15 inch ledge and hit my head/shoulder/hip/knee on the wall beyond that. Meningioma is still just being monitored, saw the MD and they said its not growing but its just there in the spine. Every single movement I make with my neck hurts when I get to the end of that range.   PERTINENT HISTORY: See above- complex medical hx  PAIN:  Are you having pain? No 0/10 at rest, twisting and bending hurts the back   PRECAUTIONS: Fall and Other: caution with movements, some movements can cause increased pressure/pain at meningioma site, ICD    RED FLAGS: None   WEIGHT BEARING RESTRICTIONS: No  FALLS:  Has patient fallen in last 6 months? Yes. Number of falls 1- had a mis-step coming off a ladder, fell backwards and hit the floor pretty hard  LIVING ENVIRONMENT: Lives with: lives with their spouse Lives in: House/apartment Stairs:  5 STE with B rails, has second floor in his home but holds rail    OCCUPATION: retired   PLOF: Independent, Independent with basic ADLs, Independent with gait, and Independent with transfers  PATIENT GOALS: getting more active, improving balance   NEXT MD VISIT: Referring scheduled but unsure when   OBJECTIVE:  Note: Objective measures were completed at Evaluation unless otherwise noted.    PATIENT SURVEYS:  ABC scale 65.6%; 12/13/23 65%  COGNITION: Overall cognitive status: Within functional limits for tasks assessed     SENSATION: Complete R UE and R LE numbness (chronic)    COORDINATION  Noted some ataxia in R LE with functional movements   LOWER EXTREMITY MMT:  MMT Right eval Left eval  Hip flexion 4+ 5  Hip extension    Hip abduction 5 5  Hip adduction    Hip internal rotation    Hip external rotation    Knee flexion 5 5  Knee extension 5 5  Ankle dorsiflexion 5 5  Ankle plantarflexion    Ankle inversion    Ankle eversion      (Blank rows = not tested)   FUNCTIONAL TESTS:  5 times sit to stand: 15.4 seconds use of UEs, some difficulty stabilizing; 11/20/23 15.2 seconds use of  UEs  Timed up and go (TUG): 14.2 seconds no device but did need MinA for balance when initially standing and then turning to sit in the chair at end of test; 11/20/23 10 seconds  3 minute walk test: 613ft no device but needed MinA at times for balance on corners, RPE 4/10; 12/13/23 565ft no device distant S, RPE 3/10     12/13/23 0001  Standardized Balance Assessment  Standardized Balance Assessment Dynamic Gait Index  Dynamic Gait Index  Level Surface 2  Change in Gait Speed 2  Gait with Horizontal Head Turns 2  Gait with Vertical Head Turns 2  Gait and Pivot Turn 1  Step Over Obstacle 1  Step Around Obstacles 1  Steps 0  Total Score 11      GAIT: Distance walked: 684ft Assistive device utilized: None Level of assistance: Min A Comments: MinA for balance with turns and when navigating obstacles, some ataxic movements noted                                                                                                                                 TREATMENT DATE:  01/02/24 NuStep L5x55mins Step ups 6" minA  Step ups on airex minA and some use of handrail Walking on beam needs to hold on in bars Standing arm ext 10# 2x10 AR press 10# 2x10 STS from elevated mat 2x5   12/28/23 Bike L3 x21mins  HS curls 25# 2x12 Leg ext 10# 2x12 Side steps by counter  Standing march requires light UE support  Standing rows and ext green band 2x10 STS 2x10 from elevated mat table  Ball toss 2x10   12/26/23 Bike L 3 x 7 min HS curls 25lb 2x15  Sides steps at [employer] set with standing marches very unstable  Alt 4in box taps  Standing ball toss  On airex  Eyes closed   12/21/23  Hooklying TA sets 15x3 seconds  PPT 15x3 seconds TA set + alternating march from hooklying x20  Reviewed and enforced BLT precautions for  his back until MRI can clarify what is happening- he often pushes through pain and encouraged him not to do this to avoid worsening of condition    Nustep L3x8 minutes (dropped resistance due to R knee pain) BLEs only  One foot on BOSU/one foot on solid ground 3x30 seconds (harder for him today)   12/19/23 NuStep L 5 x 7 min seat back for decrease knee flex  Alt 4in box taps 2x10 Side step at mat table  Sit to stand LE on airex elevated surface some UE use 2x10 On airex reaching outside [employer]. Ball kicks   Combo w/ toss and kicks Pball roll outs  Heel raises  12/13/23  Nustep x6 minutes L5 limited by knee pain today  Wide tandem on blue foam pad 3x30 seconds B Lateral toe taps off blue foam pad (alternating) x20 total  Backward alternating toe taps off blue foam pad x10 total   ABC, objectives as above, POC updates, education on progress with PT and plan moving forward, encouraged getting lx spine w/u due to increasing numbness L LE along with lx symptoms having started    12/12/23  Nustep L5 x10 minutes BLEs only for LE endurance, functional activity tolerance  Wide tandem stance one foot on blue foam/one foot on air pad 3x30 seconds each  Clearance up and over double cone x10 B, up to MinA for balance  SLS with one foot on blue wted ball 3x30 seconds B up to MinA for balance  Forward/backward ball rolls x2 rounds each side, light BUE support, up to MinA for balance  Sideways marches x4 laps in // bars up to light MinA    12/07/23 NuStep L5 x 6 min HS curls 25lb 2x10 Leg Ext 10lb 2x10 Alt 4in box taps 2x10 On airex reaching outside base of support Ball toss 2x10 Side steps at mat table Standing march  Shoulder Ext 10lb 2x10     PATIENT EDUCATION:  Education details: exam findings, POC Person educated: Patient Education method: Explanation, Demonstration, and Verbal cues Education comprehension: verbalized understanding, returned demonstration, and  needs further education  HOME EXERCISE PROGRAM:  Access Code: [email or web address] URL: [email or web address] Date: 12/21/2023 Prepared by: Terrel Ferries  Exercises - Wide Tandem Stance with Eyes Open  - 1 x daily - 7 x weekly - 2 sets - 3 reps - 30 seconds  hold - Romberg Stance  - 1 x daily - 7 x weekly - 2 sets - 3 reps - 30 second  hold - Single Leg Stance with Support  - 1 x daily - 7 x weekly - 2 sets - 3 reps - Supine Transversus Abdominis Bracing - Hands on Stomach  - 5 x daily - 7 x weekly - 1 sets - 10 reps - 3 seconds  hold - Supine Posterior Pelvic Tilt  - 2 x daily - 7 x weekly - 1 sets - 10 reps - 3 seconds  hold - Supine March  - 1 x daily - 7 x weekly - 3 sets - 10 reps    ASSESSMENT:  CLINICAL IMPRESSION: Again pt arrives today with ongoing back pain, states he might want to see as specialist as he has been dealing with it for 2 years now. We continued to work on functional interventions and strengthening. Instability present with step ups on stairs and on airex. Walking on beam is very hard, he is unable to do without holding on to bars. Pt becomes more unsteady and staggers more today with straight arm pulls and AR press. STS were also difficult today, but could be due to fatigue towards end of session.   EVAL: Patient is a 78 y.o. M who was seen today for physical therapy evaluation and treatment for Free Text Diagnosis R29.6, R26.89, R26.9 Mult. Falls, Poor Balance, Gait Disturbance. Very pleasant and cooperative but very talkative during eval which did limit assessment time. Still having a lot of pain in general since his fall  and ongoing numbness in R UE/LE. Gait mechanics and balance are definitely impaired, would benefit from training with appropriate AD if he is open to this. Will benefit from skilled PT services to address his concerns and functional objective impairments to the best of our ability.   OBJECTIVE IMPAIRMENTS: Abnormal gait, cardiopulmonary status  limiting activity, decreased activity tolerance, decreased balance, decreased mobility, and difficulty walking.  ACTIVITY LIMITATIONS: standing, squatting, stairs, transfers, reach over head, and locomotion level  PARTICIPATION LIMITATIONS: driving, shopping, community activity, and occupation  PERSONAL FACTORS: Age, Behavior pattern, Fitness, Past/current experiences, Social background, and Time since onset of injury/illness/exacerbation are also affecting patient's functional outcome.   REHAB POTENTIAL: Good  CLINICAL DECISION MAKING: Evolving/moderate complexity  EVALUATION COMPLEXITY: Moderate   GOALS: Goals reviewed with patient? No  SHORT TERM GOALS: Target date: 11/23/2023   Will be compliant with appropriate progressive HEP  Baseline: Goal status: MET 11/23/23  2.  Will be able to name 3 ways to prevent falls at home and in the community  Baseline:  Goal status: MET 11/23/23  3.  Will be able to perform 5xSTS and with distant S Baseline: MinA  Goal status: 12/13/23 MET   4.  Will complete TUG test in 12 seconds or less S  Baseline: MinA  Goal status: MET 11/20/23    LONG TERM GOALS: Target date: 01/10/2024      Will score at least 18 on DGI to show improved functional balance  Baseline:  Goal status: ONGOING 12/13/23 DGI 11/24  2.  Will be able to ambulate community distances (level and inclines) no device, RPE no more than 2/10  Baseline:  Goal status: ONGOING 12/13/23 (RPE 6/10)  3.  Will be able to perform all desired gym activities without increased pain from resting levels  Baseline:  Goal status: ONGOING 12/13/23 improving   4.  ABC score to improve by at least 20 points to show improved subjective status  Baseline:  Goal status: ONGOING 12/13/23    PLAN:  PT FREQUENCY: 2x/week  PT DURATION: 4 weeks  PLANNED INTERVENTIONS: 97164- PT Re-evaluation, 97110-Therapeutic exercises, 97530- Therapeutic activity, 97112- Neuromuscular re-education,  97535- Self Care, [id number]- Manual therapy, [id number]- Gait training, [phone]- Aquatic Therapy, and Balance training  PLAN FOR NEXT SESSION: balance and coordination progressions with compliant surfaces , functional activity tolerance.   Donavon Fudge, PT, DPT 01/02/24 11:00 AM

## 2024-01-04 ENCOUNTER — Encounter: Payer: Self-pay | Admitting: Physical Therapy

## 2024-01-04 ENCOUNTER — Ambulatory Visit: Admitting: Physical Therapy

## 2024-01-04 DIAGNOSIS — R278 Other lack of coordination: Secondary | ICD-10-CM

## 2024-01-04 DIAGNOSIS — R262 Difficulty in walking, not elsewhere classified: Secondary | ICD-10-CM

## 2024-01-04 DIAGNOSIS — R531 Weakness: Secondary | ICD-10-CM | POA: Diagnosis not present

## 2024-01-04 DIAGNOSIS — G8929 Other chronic pain: Secondary | ICD-10-CM | POA: Diagnosis not present

## 2024-01-04 DIAGNOSIS — M545 Low back pain, unspecified: Secondary | ICD-10-CM | POA: Diagnosis not present

## 2024-01-04 DIAGNOSIS — M6281 Muscle weakness (generalized): Secondary | ICD-10-CM | POA: Diagnosis not present

## 2024-01-04 DIAGNOSIS — R2681 Unsteadiness on feet: Secondary | ICD-10-CM

## 2024-01-04 DIAGNOSIS — Z6829 Body mass index (BMI) 29.0-29.9, adult: Secondary | ICD-10-CM | POA: Diagnosis not present

## 2024-01-04 DIAGNOSIS — D421 Neoplasm of uncertain behavior of spinal meninges: Secondary | ICD-10-CM | POA: Diagnosis not present

## 2024-01-04 DIAGNOSIS — M542 Cervicalgia: Secondary | ICD-10-CM | POA: Diagnosis not present

## 2024-01-04 DIAGNOSIS — R296 Repeated falls: Secondary | ICD-10-CM | POA: Diagnosis not present

## 2024-01-04 NOTE — Therapy (Signed)
 OUTPATIENT PHYSICAL THERAPY LOWER EXTREMITY TREATMENT  Patient Name: Timothy Moran MRN: [id number] DOB:1946-06-23, 78 y.o., male Today's Date: 01/04/2024      END OF SESSION:  PT End of Session - 01/04/24 1049     Visit Number 16    Date for PT Re-Evaluation 01/10/24    PT Start Time 1050    PT Stop Time 1135    PT Time Calculation (min) 45 min    Activity Tolerance Patient tolerated treatment well    Behavior During Therapy [facility] for tasks assessed/performed                     Past Medical History:  Diagnosis Date   Acute blood loss anemia 10/05/2020   Acute respiratory failure (HCC), hypothermia therapy, vent - extubated 10/06/20    AKI (acute kidney injury) (HCC) 10/05/2020   Anoxic brain injury (HCC) 10/26/2020   Arrhythmia    Ascending aortic aneurysm (HCC) 01/29/2018   a. 2019 4.3 cm by echo; b. 02/2021 Echo: Ao root 40mm.   Benign brain tumor (HCC)    Benign neoplasm of brain (HCC) 12/19/2017   Cardiac arrest with ventricular fibrillation (HCC)    Chest pain 12/04/2017   Chronic kidney disease, stage III (moderate) (HCC) 12/19/2017   CKD (chronic kidney disease), stage III - IV (HCC)    Conductive hearing loss of both ears 08/24/2018   Coronary Artery Disease    a. 09/2020 Inf STEMI complicated by cardiac arrest/PCI: LAD 50p/m, LCX 100d (2.5 x 30 Resolute Onyx DES); b. 04/2021 PCI: 04/2021 LM nl, LAD 50p/30m (2.5x30 Onyx Frontier DES), LCX 25p, 54m (RFR 0.98), 40d ISR (RFR 0.98), OM2 25, RCA mild diff dzs.   Diabetes mellitus, type 2 (HCC) 10/06/2020   10/06/20 A1C 7%   Dizziness 10/11/2019   Encounter for imaging study to confirm orogastric (OG) tube placement    Eustachian tube dysfunction 08/24/2018   Hematuria 10/26/2020   HFrEF (heart failure with reduced ejection fraction) (HCC)    a. 11/2020 Echo: EF 25-30%; b. 02/2021 Echo: EF 25-30%, glob HK. Mild LVH. Nl RV fxn. Triv AI. Ao root 40mm.   History of pulmonary embolus (PE)    HLD (hyperlipidemia)  10/26/2020   Hypertension    Iron deficiency anemia rec'd IV iron 10/26/2020   Ischemic cardiomyopathy    a. 11/2020 Echo: EF 25-30%; b. 02/2021 Echo: EF 25-30%, glob HK.   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 Aaron AasSpirometry 03/08/2018    FEV1 3.86 (108%)  Ratio 96 s prior rx  - PET  03/13/18   Low grade c/w adenoca > rec T surgery eval     STEMI (ST elevation myocardial infarction) (HCC) 10/04/2020   Urinary retention 10/26/2020   Vestibular schwannoma (HCC) 08/14/2020   Past Surgical History:  Procedure Laterality  Date   APPENDECTOMY     BIV ICD INSERTION CRT-D N/A 11/03/2021   Procedure: BIV ICD INSERTION CRT-D;  Surgeon: Lei Pump, MD;  Location: [facility] INVASIVE CV LAB;  Service: Cardiovascular;  Laterality: N/A;   CARDIAC CATHETERIZATION     CORONARY ANGIOPLASTY WITH STENT PLACEMENT Left 04/12/2021   LAD stent placement   CORONARY PRESSURE/FFR STUDY N/A 04/12/2021   Procedure: INTRAVASCULAR PRESSURE WIRE/FFR STUDY;  Surgeon: Sammy Crisp, MD;  Location: [facility] INVASIVE CV LAB;  Service: Cardiovascular;  Laterality: N/A;   CORONARY STENT INTERVENTION N/A 04/12/2021   Procedure: CORONARY STENT INTERVENTION;  Surgeon: Sammy Crisp, MD;  Location: [facility] INVASIVE CV LAB;  Service: Cardiovascular;  Laterality: N/A;   CORONARY ULTRASOUND/IVUS N/A 04/12/2021    Procedure: Intravascular Ultrasound/IVUS;  Surgeon: Sammy Crisp, MD;  Location: [facility] INVASIVE CV LAB;  Service: Cardiovascular;  Laterality: N/A;   CORONARY/GRAFT ACUTE MI REVASCULARIZATION N/A 10/04/2020   Procedure: Coronary/Graft Acute MI Revascularization;  Surgeon: Sammy Crisp, MD;  Location: [facility] INVASIVE CV LAB;  Service: Cardiovascular;  Laterality: N/A;   HERNIA REPAIR     LEFT HEART CATH AND CORONARY ANGIOGRAPHY N/A 10/04/2020   Procedure: LEFT HEART CATH AND CORONARY ANGIOGRAPHY;  Surgeon: Sammy Crisp, MD;  Location: [facility] INVASIVE CV LAB;  Service: Cardiovascular;  Laterality: N/A;   RIGHT/LEFT HEART CATH AND CORONARY ANGIOGRAPHY N/A 04/12/2021   Procedure: RIGHT/LEFT HEART CATH AND CORONARY ANGIOGRAPHY;  Surgeon: Sammy Crisp, MD;  Location: [facility] INVASIVE CV LAB;  Service: Cardiovascular;  Laterality: N/A;   Patient Active Problem List   Diagnosis Date Noted   Anemia    Leukocytosis    Right arm weakness 01/25/2022   Right sided weakness    Fall at home 01/22/2022   Status post biventricular pacemaker - MRI compatible PPM and leads 01/22/2022   Chronic indwelling Foley catheter 01/22/2022   CKD (chronic kidney disease), stage III - IV (HCC) 06/28/2021   Chronic HFrEF (heart failure with reduced ejection fraction) (HCC) 03/31/2021   Arrhythmia    DM (diabetes mellitus), type 2 (HCC) new 10/26/2020   Urinary retention 10/26/2020   Hyperlipidemia LDL goal <70 10/26/2020   Coronary artery disease of native artery of native heart with stable angina pectoris (HCC) 10/26/2020   S/P angioplasty with stent 10/04/20 DES to LCX  10/26/2020   Ischemic cardiomyopathy 10/26/2020   Diabetes mellitus, type 2 (HCC) 10/06/2020   DOE (dyspnea on exertion)    Meningioma (HCC) 08/14/2020   Vestibular schwannoma (HCC) 08/14/2020   Cardiac arrest with ventricular fibrillation (HCC)    Pain of left hip joint 05/29/2020   Benign brain tumor (HCC)    Chronic kidney disease    History of  pulmonary embolus (PE)    Hypertension    Nonsustained ventricular tachycardia (HCC) 10/11/2019   Dizziness 10/11/2019   Abnormal echocardiogram 06/07/2019   Eustachian tube dysfunction 08/24/2018   Conductive hearing loss of both ears 08/24/2018   Pulmonary hypertension due to thromboembolism (HCC) 03/09/2018   Solitary pulmonary nodule on lung CT 03/08/2018   Ascending aortic aneurysm (HCC) 4.2 cm based on CT from December 2020 01/29/2018   Sinus bradycardia 12/22/2017   Left bundle branch block 12/19/2017   Personal history of pulmonary embolism - no longer on anticoagulation due to pt refusal to take it anymore. 12/19/2017   Chronic kidney disease, stage III (moderate) (HCC) 12/19/2017   Benign neoplasm of brain (HCC) 12/19/2017    PCP: Helyn Lobstein, MD  REFERRING PROVIDER: Helyn Lobstein, MD  REFERRING DIAG:  Free Text Diagnosis R29.6, R26.89, R26.9 Mult. Falls, Poor Balance, Gait Disturbance  THERAPY DIAG:  Unsteadiness on feet  Muscle weakness (generalized)  Other lack of coordination  Difficulty in walking, not elsewhere classified  Repeated falls  Rationale for Evaluation and Treatment: Rehabilitation  ONSET DATE: chronic   SUBJECTIVE:   SUBJECTIVE STATEMENT: "Not as good as I should be" "I should be able to walk with out becoming unstable "   EVAL: Having issues with balance and walking. I do have a meningioma in my spine, and every once in awhile I have pain if I twist or stress it. Still having pain in my neck and hip from a fall that I had about a year and a half ago. Thought that pain would go away but it hasn't even this far out/being this healed. I have a lot of unsteadiness, not sure I'd call it dizziness but I have to stand for a minute before I get going. Have a couple of walking sticks/canes but not sure how to use them honestly. Entire R LE and UE are  still numb. When I fell I was at a friend's house and were leaving, about an hour before that I had  a glass of wine, I went out first and held the door for the next people but didn't realize that the concrete platform ended and dropped about 15 inches and ended up stepping off that 15 inch ledge and hit my head/shoulder/hip/knee on the wall beyond that. Meningioma is still just being monitored, saw the MD and they said its not growing but its just there in the spine. Every single movement I make with my neck hurts when I get to the end of that range.   PERTINENT HISTORY: See above- complex medical hx  PAIN:  Are you having pain? No 0/10 at rest, twisting and bending hurts the back   PRECAUTIONS: Fall and Other: caution with movements, some movements can cause increased pressure/pain at meningioma site, ICD    RED FLAGS: None   WEIGHT BEARING RESTRICTIONS: No  FALLS:  Has patient fallen in last 6 months? Yes. Number of falls 1- had a mis-step coming off a ladder, fell backwards and hit the floor pretty hard  LIVING ENVIRONMENT: Lives with: lives with their spouse Lives in: House/apartment Stairs:  5 STE with B rails, has second floor in his home but holds rail    OCCUPATION: retired   PLOF: Independent, Independent with basic ADLs, Independent with gait, and Independent with transfers  PATIENT GOALS: getting more active, improving balance   NEXT MD VISIT: Referring scheduled but unsure when   OBJECTIVE:  Note: Objective measures were completed at Evaluation unless otherwise noted.    PATIENT SURVEYS:  ABC scale 65.6%; 12/13/23 65%  COGNITION: Overall cognitive status: Within functional limits for tasks assessed     SENSATION: Complete R UE and R LE numbness (chronic)    COORDINATION  Noted some ataxia in R LE with functional movements   LOWER EXTREMITY MMT:  MMT Right eval Left eval  Hip flexion 4+ 5  Hip extension    Hip abduction 5 5  Hip adduction    Hip internal rotation    Hip external rotation    Knee flexion 5 5  Knee extension 5 5  Ankle  dorsiflexion 5 5  Ankle plantarflexion    Ankle inversion    Ankle eversion     (Blank rows = not tested)   FUNCTIONAL TESTS:  5 times sit  to stand: 15.4 seconds use of UEs, some difficulty stabilizing; 11/20/23 15.2 seconds use of UEs  Timed up and go (TUG): 14.2 seconds no device but did need MinA for balance when initially standing and then turning to sit in the chair at end of test; 11/20/23 10 seconds  3 minute walk test: 660ft no device but needed MinA at times for balance on corners, RPE 4/10; 12/13/23 529ft no device distant S, RPE 3/10     12/13/23 0001   Standardized Balance Assessment   Standardized Balance Assessment Dynamic Gait Index   Dynamic Gait Index   Level Surface 2 2  Change in Gait Speed 2 2  Gait with Horizontal Head Turns 2 2  Gait with Vertical Head Turns 2 2  Gait and Pivot Turn 1 1  Step Over Obstacle 1 1  Step Around Obstacles 1 2  Steps 0 2  Total Score 11 14      GAIT: Distance walked: 624ft Assistive device utilized: None Level of assistance: Min A Comments: MinA for balance with turns and when navigating obstacles, some ataxic movements noted                                                                                                                                 TREATMENT DATE:  01/04/24 NuStep L5x 7 mins DGI Resisted gait all directions 30lb x4 each seated rest needed during  Sit to stands LE on airex elevated surface 2x10 Alt 4in box taps 2x10  HS curls 35lb 2x10 Leg Ext 10lb 2x10 01/02/24 NuStep L5x36mins Step ups 6" minA  Step ups on airex minA and some use of handrail Walking on beam needs to hold on in bars Standing arm ext 10# 2x10 AR press 10# 2x10 STS from elevated mat 2x5   12/28/23 Bike L3 x10mins  HS curls 25# 2x12 Leg ext 10# 2x12 Side steps by counter  Standing march requires light UE support  Standing rows and ext green band 2x10 STS 2x10 from elevated mat table  Ball toss 2x10   12/26/23 Bike L 3 x 7  min HS curls 25lb 2x15  Sides steps at [employer] set with standing marches very unstable  Alt 4in box taps  Standing ball toss  On airex  Eyes closed   12/21/23  Hooklying TA sets 15x3 seconds  PPT 15x3 seconds TA set + alternating march from hooklying x20  Reviewed and enforced BLT precautions for his back until MRI can clarify what is happening- he often pushes through pain and encouraged him not to do this to avoid worsening of condition    Nustep L3x8 minutes (dropped resistance due to R knee pain) BLEs only  One foot on BOSU/one foot on solid ground 3x30 seconds (harder for him today)   12/19/23 NuStep L 5 x 7 min seat back for decrease knee flex  Alt 4in box taps 2x10 Side step at mat table  Sit to stand LE on airex elevated surface some UE use 2x10 On airex reaching outside [employer]. Ball kicks   Combo w/ toss and kicks Pball roll outs  Heel raises  12/13/23  Nustep x6 minutes L5 limited by knee pain today  Wide tandem on blue foam pad 3x30 seconds B Lateral toe taps off blue foam pad (alternating) x20 total  Backward alternating toe taps off blue foam pad x10 total   ABC, objectives as above, POC updates, education on progress with PT and plan moving forward, encouraged getting lx spine w/u due to increasing numbness L LE along with lx symptoms having started    12/12/23  Nustep L5 x10 minutes BLEs only for LE endurance, functional activity tolerance  Wide tandem stance one foot on blue foam/one foot on air pad 3x30 seconds each  Clearance up and over double cone x10 B, up to MinA for balance  SLS with one foot on blue wted ball 3x30 seconds B up to MinA for balance  Forward/backward ball rolls x2 rounds each side, light BUE support, up to MinA for balance  Sideways marches x4 laps in // bars up to light MinA    12/07/23 NuStep L5 x 6 min HS curls 25lb 2x10 Leg Ext 10lb 2x10 Alt 4in box taps 2x10 On airex reaching outside base of support Ball  toss 2x10 Side steps at mat table Standing march  Shoulder Ext 10lb 2x10     PATIENT EDUCATION:  Education details: exam findings, POC Person educated: Patient Education method: Explanation, Demonstration, and Verbal cues Education comprehension: verbalized understanding, returned demonstration, and needs further education  HOME EXERCISE PROGRAM:  Access Code: [email or web address] URL: [email or web address] Date: 12/21/2023 Prepared by: Terrel Ferries  Exercises - Wide Tandem Stance with Eyes Open  - 1 x daily - 7 x weekly - 2 sets - 3 reps - 30 seconds  hold - Romberg Stance  - 1 x daily - 7 x weekly - 2 sets - 3 reps - 30 second  hold - Single Leg Stance with Support  - 1 x daily - 7 x weekly - 2 sets - 3 reps - Supine Transversus Abdominis Bracing - Hands on Stomach  - 5 x daily - 7 x weekly - 1 sets - 10 reps - 3 seconds  hold - Supine Posterior Pelvic Tilt  - 2 x daily - 7 x weekly - 1 sets - 10 reps - 3 seconds  hold - Supine March  - 1 x daily - 7 x weekly - 3 sets - 10 reps    ASSESSMENT:  CLINICAL IMPRESSION: Again pt arrives today with ongoing back pain, states he has an MD appointment today to get referred to a back specialist.. We continued to work on functional interventions and strengthening. Instability present with airex and alt box taps. Increase fatigue present with resisted gait, pt needed to have a seated rest break during the interventions. Some OB with sit to stands on airex.  EVAL: Patient is a 78 y.o. M who was seen today for physical therapy evaluation and treatment for Free Text Diagnosis R29.6, R26.89, R26.9 Mult. Falls, Poor Balance, Gait Disturbance. Very pleasant and cooperative but very talkative during eval which did limit assessment time. Still having a lot of pain in general since his fall  and ongoing numbness in R UE/LE. Gait mechanics and balance are definitely impaired, would benefit from training with appropriate AD if he is open to this.  Will benefit from  skilled PT services to address his concerns and functional objective impairments to the best of our ability.   OBJECTIVE IMPAIRMENTS: Abnormal gait, cardiopulmonary status limiting activity, decreased activity tolerance, decreased balance, decreased mobility, and difficulty walking.   ACTIVITY LIMITATIONS: standing, squatting, stairs, transfers, reach over head, and locomotion level  PARTICIPATION LIMITATIONS: driving, shopping, community activity, and occupation  PERSONAL FACTORS: Age, Behavior pattern, Fitness, Past/current experiences, Social background, and Time since onset of injury/illness/exacerbation are also affecting patient's functional outcome.   REHAB POTENTIAL: Good  CLINICAL DECISION MAKING: Evolving/moderate complexity  EVALUATION COMPLEXITY: Moderate   GOALS: Goals reviewed with patient? No  SHORT TERM GOALS: Target date: 11/23/2023   Will be compliant with appropriate progressive HEP  Baseline: Goal status: MET 11/23/23  2.  Will be able to name 3 ways to prevent falls at home and in the community  Baseline:  Goal status: MET 11/23/23  3.  Will be able to perform 5xSTS and with distant S Baseline: MinA  Goal status: 12/13/23 MET   4.  Will complete TUG test in 12 seconds or less S  Baseline: MinA  Goal status: MET 11/20/23    LONG TERM GOALS: Target date: 01/10/2024      Will score at least 18 on DGI to show improved functional balance  Baseline:  Goal status: ONGOING 12/13/23 DGI 11/24, 01/04/24 progressing  14/30  2.  Will be able to ambulate community distances (level and inclines) no device, RPE no more than 2/10  Baseline:  Goal status: ONGOING 12/13/23 (RPE 6/10)  3.  Will be able to perform all desired gym activities without increased pain from resting levels  Baseline:  Goal status: ONGOING 12/13/23 improving   4.  ABC score to improve by at least 20 points to show improved subjective status  Baseline:  Goal status:  ONGOING 12/13/23    PLAN:  PT FREQUENCY: 2x/week  PT DURATION: 4 weeks  PLANNED INTERVENTIONS: 97164- PT Re-evaluation, 97110-Therapeutic exercises, 97530- Therapeutic activity, 97112- Neuromuscular re-education, 97535- Self Care, [id number]- Manual therapy, [id number]- Gait training, [phone]- Aquatic Therapy, and Balance training  PLAN FOR NEXT SESSION: balance and coordination progressions with compliant surfaces , functional activity tolerance.   Towanda Fret, PTA 01/04/24 10:51 AM

## 2024-01-09 ENCOUNTER — Encounter: Payer: Self-pay | Admitting: Physical Therapy

## 2024-01-09 ENCOUNTER — Ambulatory Visit: Admitting: Physical Therapy

## 2024-01-09 DIAGNOSIS — R531 Weakness: Secondary | ICD-10-CM | POA: Diagnosis not present

## 2024-01-09 DIAGNOSIS — R278 Other lack of coordination: Secondary | ICD-10-CM | POA: Diagnosis not present

## 2024-01-09 DIAGNOSIS — M6281 Muscle weakness (generalized): Secondary | ICD-10-CM

## 2024-01-09 DIAGNOSIS — R2681 Unsteadiness on feet: Secondary | ICD-10-CM | POA: Diagnosis not present

## 2024-01-09 DIAGNOSIS — R262 Difficulty in walking, not elsewhere classified: Secondary | ICD-10-CM | POA: Diagnosis not present

## 2024-01-09 DIAGNOSIS — R296 Repeated falls: Secondary | ICD-10-CM | POA: Diagnosis not present

## 2024-01-09 NOTE — Therapy (Signed)
 OUTPATIENT PHYSICAL THERAPY LOWER EXTREMITY TREATMENT  Patient Name: Timothy Moran MRN: [id number] DOB:[birth year], 78 y.o., male Today's Date: 01/09/2024      END OF SESSION:  PT End of Session - 01/09/24 1059     Visit Number 17    Date for PT Re-Evaluation 01/10/24    PT Start Time 1100    PT Stop Time 1145    PT Time Calculation (min) 45 min    Activity Tolerance Patient tolerated treatment well    Behavior During Therapy [facility] for tasks assessed/performed                     Past Medical History:  Diagnosis Date   Acute blood loss anemia 10/05/2020   Acute respiratory failure (HCC), hypothermia therapy, vent - extubated 10/06/20    AKI (acute kidney injury) (HCC) 10/05/2020   Anoxic brain injury (HCC) 10/26/2020   Arrhythmia    Ascending aortic aneurysm (HCC) 01/29/2018   a. 2019 4.3 cm by echo; b. 02/2021 Echo: Ao root 40mm.   Benign brain tumor (HCC)    Benign neoplasm of brain (HCC) 12/19/2017   Cardiac arrest with ventricular fibrillation (HCC)    Chest pain 12/04/2017   Chronic kidney disease, stage III (moderate) (HCC) 12/19/2017   CKD (chronic kidney disease), stage III - IV (HCC)    Conductive hearing loss of both ears 08/24/2018   Coronary Artery Disease    a. 09/2020 Inf STEMI complicated by cardiac arrest/PCI: LAD 50p/m, LCX 100d (2.5 x 30 Resolute Onyx DES); b. 04/2021 PCI: 04/2021 LM nl, LAD 50p/32m (2.5x30 Onyx Frontier DES), LCX 25p, 14m (RFR 0.98), 40d ISR (RFR 0.98), OM2 25, RCA mild diff dzs.   Diabetes mellitus, type 2 (HCC) 10/06/2020   10/06/20 A1C 7%   Dizziness 10/11/2019   Encounter for imaging study to confirm orogastric (OG) tube placement    Eustachian tube dysfunction 08/24/2018   Hematuria 10/26/2020   HFrEF (heart failure with reduced ejection fraction) (HCC)    a. 11/2020 Echo: EF 25-30%; b. 02/2021 Echo: EF 25-30%, glob HK. Mild LVH. Nl RV fxn. Triv AI. Ao root 40mm.   History of pulmonary embolus (PE)    HLD (hyperlipidemia)  10/26/2020   Hypertension    Iron deficiency anemia rec'd IV iron 10/26/2020   Ischemic cardiomyopathy    a. 11/2020 Echo: EF 25-30%; b. 02/2021 Echo: EF 25-30%, glob HK.   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 Aaron AasSpirometry 03/08/2018    FEV1 3.86 (108%)  Ratio 96 s prior rx  - PET  03/13/18   Low grade c/w adenoca > rec T surgery eval     STEMI (ST elevation myocardial infarction) (HCC) 10/04/2020   Urinary retention 10/26/2020   Vestibular schwannoma (HCC) 08/14/2020   Past Surgical History:  Procedure Laterality  Date   APPENDECTOMY     BIV ICD INSERTION CRT-D N/A 11/03/2021   Procedure: BIV ICD INSERTION CRT-D;  Surgeon: Lei Pump, MD;  Location: [facility] INVASIVE CV LAB;  Service: Cardiovascular;  Laterality: N/A;   CARDIAC CATHETERIZATION     CORONARY ANGIOPLASTY WITH STENT PLACEMENT Left 04/12/2021   LAD stent placement   CORONARY PRESSURE/FFR STUDY N/A 04/12/2021   Procedure: INTRAVASCULAR PRESSURE WIRE/FFR STUDY;  Surgeon: Sammy Crisp, MD;  Location: [facility] INVASIVE CV LAB;  Service: Cardiovascular;  Laterality: N/A;   CORONARY STENT INTERVENTION N/A 04/12/2021   Procedure: CORONARY STENT INTERVENTION;  Surgeon: Sammy Crisp, MD;  Location: [facility] INVASIVE CV LAB;  Service: Cardiovascular;  Laterality: N/A;   CORONARY ULTRASOUND/IVUS N/A 04/12/2021    Procedure: Intravascular Ultrasound/IVUS;  Surgeon: Sammy Crisp, MD;  Location: [facility] INVASIVE CV LAB;  Service: Cardiovascular;  Laterality: N/A;   CORONARY/GRAFT ACUTE MI REVASCULARIZATION N/A 10/04/2020   Procedure: Coronary/Graft Acute MI Revascularization;  Surgeon: Sammy Crisp, MD;  Location: [facility] INVASIVE CV LAB;  Service: Cardiovascular;  Laterality: N/A;   HERNIA REPAIR     LEFT HEART CATH AND CORONARY ANGIOGRAPHY N/A 10/04/2020   Procedure: LEFT HEART CATH AND CORONARY ANGIOGRAPHY;  Surgeon: Sammy Crisp, MD;  Location: [facility] INVASIVE CV LAB;  Service: Cardiovascular;  Laterality: N/A;   RIGHT/LEFT HEART CATH AND CORONARY ANGIOGRAPHY N/A 04/12/2021   Procedure: RIGHT/LEFT HEART CATH AND CORONARY ANGIOGRAPHY;  Surgeon: Sammy Crisp, MD;  Location: [facility] INVASIVE CV LAB;  Service: Cardiovascular;  Laterality: N/A;   Patient Active Problem List   Diagnosis Date Noted   Anemia    Leukocytosis    Right arm weakness 01/25/2022   Right sided weakness    Fall at home 01/22/2022   Status post biventricular pacemaker - MRI compatible PPM and leads 01/22/2022   Chronic indwelling Foley catheter 01/22/2022   CKD (chronic kidney disease), stage III - IV (HCC) 06/28/2021   Chronic HFrEF (heart failure with reduced ejection fraction) (HCC) 03/31/2021   Arrhythmia    DM (diabetes mellitus), type 2 (HCC) new 10/26/2020   Urinary retention 10/26/2020   Hyperlipidemia LDL goal <70 10/26/2020   Coronary artery disease of native artery of native heart with stable angina pectoris (HCC) 10/26/2020   S/P angioplasty with stent 10/04/20 DES to LCX  10/26/2020   Ischemic cardiomyopathy 10/26/2020   Diabetes mellitus, type 2 (HCC) 10/06/2020   DOE (dyspnea on exertion)    Meningioma (HCC) 08/14/2020   Vestibular schwannoma (HCC) 08/14/2020   Cardiac arrest with ventricular fibrillation (HCC)    Pain of left hip joint 05/29/2020   Benign brain tumor (HCC)    Chronic kidney disease    History of  pulmonary embolus (PE)    Hypertension    Nonsustained ventricular tachycardia (HCC) 10/11/2019   Dizziness 10/11/2019   Abnormal echocardiogram 06/07/2019   Eustachian tube dysfunction 08/24/2018   Conductive hearing loss of both ears 08/24/2018   Pulmonary hypertension due to thromboembolism (HCC) 03/09/2018   Solitary pulmonary nodule on lung CT 03/08/2018   Ascending aortic aneurysm (HCC) 4.2 cm based on CT from December 2020 01/29/2018   Sinus bradycardia 12/22/2017   Left bundle branch block 12/19/2017   Personal history of pulmonary embolism - no longer on anticoagulation due to pt refusal to take it anymore. 12/19/2017   Chronic kidney disease, stage III (moderate) (HCC) 12/19/2017   Benign neoplasm of brain (HCC) 12/19/2017    PCP: Helyn Lobstein, MD  REFERRING PROVIDER: Helyn Lobstein, MD  REFERRING DIAG:  Free Text Diagnosis R29.6, R26.89, R26.9 Mult. Falls, Poor Balance, Gait Disturbance  THERAPY DIAG:  Unsteadiness on feet  Muscle weakness (generalized)  Other lack of coordination  Difficulty in walking, not elsewhere classified  Repeated falls  Right sided weakness  Rationale for Evaluation and Treatment: Rehabilitation  ONSET DATE: chronic   SUBJECTIVE:   SUBJECTIVE STATEMENT: Happier if things were going better. No update with his back   EVAL: Having issues with balance and walking. I do have a meningioma in my spine, and every once in awhile I have pain if I twist or stress it. Still having pain in my neck and hip from a fall that I had about a year and a half ago. Thought that pain would go away but it hasn't even this far out/being this healed. I have a lot of unsteadiness, not sure I'd call it dizziness but I have to stand for a minute before I get going. Have a couple of walking sticks/canes but not sure how to use them honestly. Entire R LE and UE are  still numb. When I fell I was at a friend's house and were leaving, about an hour before that I had  a glass of wine, I went out first and held the door for the next people but didn't realize that the concrete platform ended and dropped about 15 inches and ended up stepping off that 15 inch ledge and hit my head/shoulder/hip/knee on the wall beyond that. Meningioma is still just being monitored, saw the MD and they said its not growing but its just there in the spine. Every single movement I make with my neck hurts when I get to the end of that range.   PERTINENT HISTORY: See above- complex medical hx  PAIN:  Are you having pain? No 0/10 at rest, twisting and bending 3 0r 4 /10   PRECAUTIONS: Fall and Other: caution with movements, some movements can cause increased pressure/pain at meningioma site, ICD    RED FLAGS: None   WEIGHT BEARING RESTRICTIONS: No  FALLS:  Has patient fallen in last 6 months? Yes. Number of falls 1- had a mis-step coming off a ladder, fell backwards and hit the floor pretty hard  LIVING ENVIRONMENT: Lives with: lives with their spouse Lives in: House/apartment Stairs:  5 STE with B rails, has second floor in his home but holds rail    OCCUPATION: retired   PLOF: Independent, Independent with basic ADLs, Independent with gait, and Independent with transfers  PATIENT GOALS: getting more active, improving balance   NEXT MD VISIT: Referring scheduled but unsure when   OBJECTIVE:  Note: Objective measures were completed at Evaluation unless otherwise noted.    PATIENT SURVEYS:  ABC scale 65.6%; 12/13/23 65%  COGNITION: Overall cognitive status: Within functional limits for tasks assessed     SENSATION: Complete R UE and R LE numbness (chronic)    COORDINATION  Noted some ataxia in R LE with functional movements   LOWER EXTREMITY MMT:  MMT Right eval Left eval  Hip flexion 4+ 5  Hip extension    Hip abduction 5 5  Hip adduction    Hip internal rotation    Hip external rotation    Knee flexion 5 5  Knee extension 5 5  Ankle  dorsiflexion 5 5  Ankle plantarflexion    Ankle inversion    Ankle eversion     (Blank rows = not tested)   FUNCTIONAL TESTS:  5 times sit to stand:  15.4 seconds use of UEs, some difficulty stabilizing; 11/20/23 15.2 seconds use of UEs  Timed up and go (TUG): 14.2 seconds no device but did need MinA for balance when initially standing and then turning to sit in the chair at end of test; 11/20/23 10 seconds  3 minute walk test: 634ft no device but needed MinA at times for balance on corners, RPE 4/10; 12/13/23 53ft no device distant S, RPE 3/10     12/13/23 0001   Standardized Balance Assessment   Standardized Balance Assessment Dynamic Gait Index   Dynamic Gait Index   Level Surface 2 2  Change in Gait Speed 2 2  Gait with Horizontal Head Turns 2 2  Gait with Vertical Head Turns 2 2  Gait and Pivot Turn 1 1  Step Over Obstacle 1 1  Step Around Obstacles 1 2  Steps 0 2  Total Score 11 14      GAIT: Distance walked: 65ft Assistive device utilized: None Level of assistance: Min A Comments: MinA for balance with turns and when navigating obstacles, some ataxic movements noted                                                                                                                                 TREATMENT DATE:  01/09/24 Bike L 4 x 1 in stopped due to R knee pain NuStep L 5 x 6 min Step ups 6" min A  Lateral 4in step ups some UE assit x10 Standing on airex weighted ball toss  Sit to stand LE on airex 2x10 elevated surface  Tandem walking in // barx Side steps on airex in // bars  01/04/24 NuStep L5x 7 mins DGI Resisted gait all directions 30lb x4 each seated rest needed during  Sit to stands LE on airex elevated surface 2x10 Alt 4in box taps 2x10  HS curls 35lb 2x10 Leg Ext 10lb 2x10  01/02/24 NuStep L5x74mins Step ups 6" minA  Step ups on airex minA and some use of handrail Walking on beam needs to hold on in bars Standing arm ext 10# 2x10 AR press 10#  2x10 STS from elevated mat 2x5   12/28/23 Bike L3 x54mins  HS curls 25# 2x12 Leg ext 10# 2x12 Side steps by counter  Standing march requires light UE support  Standing rows and ext green band 2x10 STS 2x10 from elevated mat table  Ball toss 2x10   12/26/23 Bike L 3 x 7 min HS curls 25lb 2x15  Sides steps at [employer] set with standing marches very unstable  Alt 4in box taps  Standing ball toss  On airex  Eyes closed   12/21/23  Hooklying TA sets 15x3 seconds  PPT 15x3 seconds TA set + alternating march from hooklying x20  Reviewed and enforced BLT precautions for his back until MRI can clarify what is happening- he often pushes through pain and encouraged him not to do  this to avoid worsening of condition    Nustep L3x8 minutes (dropped resistance due to R knee pain) BLEs only  One foot on BOSU/one foot on solid ground 3x30 seconds (harder for him today)   12/19/23 NuStep L 5 x 7 min seat back for decrease knee flex  Alt 4in box taps 2x10 Side step at mat table  Sit to stand LE on airex elevated surface some UE use 2x10 On airex reaching outside [employer]. Ball kicks   Combo w/ toss and kicks Pball roll outs  Heel raises     PATIENT EDUCATION:  Education details: exam findings, POC Person educated: Patient Education method: Explanation, Demonstration, and Verbal cues Education comprehension: verbalized understanding, returned demonstration, and needs further education  HOME EXERCISE PROGRAM:  Access Code: [email or web address] URL: [email or web address] Date: 12/21/2023 Prepared by: Terrel Ferries  Exercises - Wide Tandem Stance with Eyes Open  - 1 x daily - 7 x weekly - 2 sets - 3 reps - 30 seconds  hold - Romberg Stance  - 1 x daily - 7 x weekly - 2 sets - 3 reps - 30 second  hold - Single Leg Stance with Support  - 1 x daily - 7 x weekly - 2 sets - 3 reps - Supine Transversus Abdominis Bracing - Hands on Stomach  - 5 x daily - 7 x weekly - 1  sets - 10 reps - 3 seconds  hold - Supine Posterior Pelvic Tilt  - 2 x daily - 7 x weekly - 1 sets - 10 reps - 3 seconds  hold - Supine March  - 1 x daily - 7 x weekly - 3 sets - 10 reps    ASSESSMENT:  CLINICAL IMPRESSION: Again pt arrives today with ongoing back pain with bending and twisting.  We continued to work on functional interventions that challenges his balance. Instability present with all activites. LOB with ball toss standing on airex. increase fatigue present with sit to stands. Pt does require min assist at times to regain balance with activity.   EVAL: Patient is a 78 y.o. M who was seen today for physical therapy evaluation and treatment for Free Text Diagnosis R29.6, R26.89, R26.9 Mult. Falls, Poor Balance, Gait Disturbance. Very pleasant and cooperative but very talkative during eval which did limit assessment time. Still having a lot of pain in general since his fall  and ongoing numbness in R UE/LE. Gait mechanics and balance are definitely impaired, would benefit from training with appropriate AD if he is open to this. Will benefit from skilled PT services to address his concerns and functional objective impairments to the best of our ability.   OBJECTIVE IMPAIRMENTS: Abnormal gait, cardiopulmonary status limiting activity, decreased activity tolerance, decreased balance, decreased mobility, and difficulty walking.   ACTIVITY LIMITATIONS: standing, squatting, stairs, transfers, reach over head, and locomotion level  PARTICIPATION LIMITATIONS: driving, shopping, community activity, and occupation  PERSONAL FACTORS: Age, Behavior pattern, Fitness, Past/current experiences, Social background, and Time since onset of injury/illness/exacerbation are also affecting patient's functional outcome.   REHAB POTENTIAL: Good  CLINICAL DECISION MAKING: Evolving/moderate complexity  EVALUATION COMPLEXITY: Moderate   GOALS: Goals reviewed with patient? No  SHORT TERM GOALS:  Target date: 11/23/2023   Will be compliant with appropriate progressive HEP  Baseline: Goal status: MET 11/23/23  2.  Will be able to name 3 ways to prevent falls at home and in the community  Baseline:  Goal status: MET 11/23/23  3.  Will  be able to perform 5xSTS and with distant S Baseline: MinA  Goal status: 12/13/23 MET   4.  Will complete TUG test in 12 seconds or less S  Baseline: MinA  Goal status: MET 11/20/23    LONG TERM GOALS: Target date: 01/10/2024      Will score at least 18 on DGI to show improved functional balance  Baseline:  Goal status: ONGOING 12/13/23 DGI 11/24, 01/04/24 progressing  14/30  2.  Will be able to ambulate community distances (level and inclines) no device, RPE no more than 2/10  Baseline:  Goal status: ONGOING 12/13/23 (RPE 6/10)  3.  Will be able to perform all desired gym activities without increased pain from resting levels  Baseline:  Goal status: ONGOING 12/13/23 improving   4.  ABC score to improve by at least 20 points to show improved subjective status  Baseline:  Goal status: ONGOING 12/13/23    PLAN:  PT FREQUENCY: 2x/week  PT DURATION: 4 weeks  PLANNED INTERVENTIONS: 97164- PT Re-evaluation, 97110-Therapeutic exercises, 97530- Therapeutic activity, 97112- Neuromuscular re-education, 97535- Self Care, [id number]- Manual therapy, [id number]- Gait training, [phone]- Aquatic Therapy, and Balance training  PLAN FOR NEXT SESSION: balance and coordination progressions with compliant surfaces , functional activity tolerance.   Towanda Fret, PTA 01/09/24 11:00 AM

## 2024-01-16 ENCOUNTER — Encounter: Payer: Self-pay | Admitting: Physical Therapy

## 2024-01-16 ENCOUNTER — Ambulatory Visit: Attending: Family Medicine | Admitting: Physical Therapy

## 2024-01-16 DIAGNOSIS — D329 Benign neoplasm of meninges, unspecified: Secondary | ICD-10-CM

## 2024-01-16 DIAGNOSIS — R29898 Other symptoms and signs involving the musculoskeletal system: Secondary | ICD-10-CM

## 2024-01-16 DIAGNOSIS — M25511 Pain in right shoulder: Secondary | ICD-10-CM

## 2024-01-16 DIAGNOSIS — M6281 Muscle weakness (generalized): Secondary | ICD-10-CM

## 2024-01-16 DIAGNOSIS — R296 Repeated falls: Secondary | ICD-10-CM | POA: Insufficient documentation

## 2024-01-16 DIAGNOSIS — W19XXXA Unspecified fall, initial encounter: Secondary | ICD-10-CM

## 2024-01-16 DIAGNOSIS — R2689 Other abnormalities of gait and mobility: Secondary | ICD-10-CM | POA: Diagnosis not present

## 2024-01-16 DIAGNOSIS — R531 Weakness: Secondary | ICD-10-CM

## 2024-01-16 DIAGNOSIS — R2681 Unsteadiness on feet: Secondary | ICD-10-CM

## 2024-01-16 DIAGNOSIS — R262 Difficulty in walking, not elsewhere classified: Secondary | ICD-10-CM

## 2024-01-16 DIAGNOSIS — R278 Other lack of coordination: Secondary | ICD-10-CM

## 2024-01-16 DIAGNOSIS — Y92009 Unspecified place in unspecified non-institutional (private) residence as the place of occurrence of the external cause: Secondary | ICD-10-CM

## 2024-01-16 NOTE — Therapy (Signed)
 OUTPATIENT PHYSICAL THERAPY LOWER EXTREMITY TREATMENT  Patient Name: Timothy Moran MRN: [id number] DOB:06-23-1946, 78 y.o., male Today's Date: 01/18/2024      END OF SESSION:  PT End of Session - 01/18/24 1146     Visit Number 19    Number of Visits --    Date for PT Re-Evaluation 02/01/24    Authorization Type MCR    PT Start Time 1145    PT Stop Time 1230    PT Time Calculation (min) 45 min                      Past Medical History:  Diagnosis Date   Acute blood loss anemia 10/05/2020   Acute respiratory failure (HCC), hypothermia therapy, vent - extubated 10/06/20    AKI (acute kidney injury) (HCC) 10/05/2020   Anoxic brain injury (HCC) 10/26/2020   Arrhythmia    Ascending aortic aneurysm (HCC) 01/29/2018   a. 2019 4.3 cm by echo; b. 02/2021 Echo: Ao root 40mm.   Benign brain tumor (HCC)    Benign neoplasm of brain (HCC) 12/19/2017   Cardiac arrest with ventricular fibrillation (HCC)    Chest pain 12/04/2017   Chronic kidney disease, stage III (moderate) (HCC) 12/19/2017   CKD (chronic kidney disease), stage III - IV (HCC)    Conductive hearing loss of both ears 08/24/2018   Coronary Artery Disease    a. 09/2020 Inf STEMI complicated by cardiac arrest/PCI: LAD 50p/m, LCX 100d (2.5 x 30 Resolute Onyx DES); b. 04/2021 PCI: 04/2021 LM nl, LAD 50p/92m (2.5x30 Onyx Frontier DES), LCX 25p, 92m (RFR 0.98), 40d ISR (RFR 0.98), OM2 25, RCA mild diff dzs.   Diabetes mellitus, type 2 (HCC) 10/06/2020   10/06/20 A1C 7%   Dizziness 10/11/2019   Encounter for imaging study to confirm orogastric (OG) tube placement    Eustachian tube dysfunction 08/24/2018   Hematuria 10/26/2020   HFrEF (heart failure with reduced ejection fraction) (HCC)    a. 11/2020 Echo: EF 25-30%; b. 02/2021 Echo: EF 25-30%, glob HK. Mild LVH. Nl RV fxn. Triv AI. Ao root 40mm.   History of pulmonary embolus (PE)    HLD (hyperlipidemia) 10/26/2020   Hypertension    Iron deficiency anemia rec'd IV  iron 10/26/2020   Ischemic cardiomyopathy    a. 11/2020 Echo: EF 25-30%; b. 02/2021 Echo: EF 25-30%, glob HK.   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 Aaron AasSpirometry 03/08/2018    FEV1 3.86 (108%)  Ratio 96 s prior rx  - PET  03/13/18   Low grade c/w adenoca > rec T surgery eval     STEMI (ST elevation myocardial infarction) (HCC) 10/04/2020   Urinary retention 10/26/2020   Vestibular schwannoma (HCC) 08/14/2020   Past Surgical History:  Procedure Laterality Date   APPENDECTOMY  BIV ICD INSERTION CRT-D N/A 11/03/2021   Procedure: BIV ICD INSERTION CRT-D;  Surgeon: Lei Pump, MD;  Location: [facility] INVASIVE CV LAB;  Service: Cardiovascular;  Laterality: N/A;   CARDIAC CATHETERIZATION     CORONARY ANGIOPLASTY WITH STENT PLACEMENT Left 04/12/2021   LAD stent placement   CORONARY PRESSURE/FFR STUDY N/A 04/12/2021   Procedure: INTRAVASCULAR PRESSURE WIRE/FFR STUDY;  Surgeon: Sammy Crisp, MD;  Location: [facility] INVASIVE CV LAB;  Service: Cardiovascular;  Laterality: N/A;   CORONARY STENT INTERVENTION N/A 04/12/2021   Procedure: CORONARY STENT INTERVENTION;  Surgeon: Sammy Crisp, MD;  Location: [facility] INVASIVE CV LAB;  Service: Cardiovascular;  Laterality: N/A;   CORONARY ULTRASOUND/IVUS N/A 04/12/2021   Procedure: Intravascular Ultrasound/IVUS;  Surgeon: Sammy Crisp,  MD;  Location: [facility] INVASIVE CV LAB;  Service: Cardiovascular;  Laterality: N/A;   CORONARY/GRAFT ACUTE MI REVASCULARIZATION N/A 10/04/2020   Procedure: Coronary/Graft Acute MI Revascularization;  Surgeon: Sammy Crisp, MD;  Location: [facility] INVASIVE CV LAB;  Service: Cardiovascular;  Laterality: N/A;   HERNIA REPAIR     LEFT HEART CATH AND CORONARY ANGIOGRAPHY N/A 10/04/2020   Procedure: LEFT HEART CATH AND CORONARY ANGIOGRAPHY;  Surgeon: Sammy Crisp, MD;  Location: [facility] INVASIVE CV LAB;  Service: Cardiovascular;  Laterality: N/A;   RIGHT/LEFT HEART CATH AND CORONARY ANGIOGRAPHY N/A 04/12/2021   Procedure: RIGHT/LEFT HEART CATH AND CORONARY ANGIOGRAPHY;  Surgeon: Sammy Crisp, MD;  Location: [facility] INVASIVE CV LAB;  Service: Cardiovascular;  Laterality: N/A;   Patient Active Problem List   Diagnosis Date Noted   Anemia    Leukocytosis    Right arm weakness 01/25/2022   Right sided weakness    Fall at home 01/22/2022   Status post biventricular pacemaker - MRI compatible PPM and leads 01/22/2022   Chronic indwelling Foley catheter 01/22/2022   CKD (chronic kidney disease), stage III - IV (HCC) 06/28/2021   Chronic HFrEF (heart failure with reduced ejection fraction) (HCC) 03/31/2021   Arrhythmia    DM (diabetes mellitus), type 2 (HCC) new 10/26/2020   Urinary retention 10/26/2020   Hyperlipidemia LDL goal <70 10/26/2020   Coronary artery disease of native artery of native heart with stable angina pectoris (HCC) 10/26/2020   S/P angioplasty with stent 10/04/20 DES to LCX  10/26/2020   Ischemic cardiomyopathy 10/26/2020   Diabetes mellitus, type 2 (HCC) 10/06/2020   DOE (dyspnea on exertion)    Meningioma (HCC) 08/14/2020   Vestibular schwannoma (HCC) 08/14/2020   Cardiac arrest with ventricular fibrillation (HCC)    Pain of left hip joint 05/29/2020   Benign brain tumor (HCC)    Chronic kidney disease    History of pulmonary embolus (PE)    Hypertension    Nonsustained ventricular  tachycardia (HCC) 10/11/2019   Dizziness 10/11/2019   Abnormal echocardiogram 06/07/2019   Eustachian tube dysfunction 08/24/2018   Conductive hearing loss of both ears 08/24/2018   Pulmonary hypertension due to thromboembolism (HCC) 03/09/2018   Solitary pulmonary nodule on lung CT 03/08/2018   Ascending aortic aneurysm (HCC) 4.2 cm based on CT from December 2020 01/29/2018   Sinus bradycardia 12/22/2017   Left bundle branch block 12/19/2017   Personal history of pulmonary embolism - no longer on anticoagulation due to pt refusal to take it anymore. 12/19/2017   Chronic kidney disease, stage III (moderate) (HCC) 12/19/2017   Benign neoplasm of brain (HCC) 12/19/2017    PCP: Helyn Lobstein, MD  REFERRING PROVIDER: Helyn Lobstein, MD  REFERRING DIAG: Free Text Diagnosis R29.6, R26.89, R26.9 Mult. Falls,  Poor Balance, Gait Disturbance  THERAPY DIAG:  Unsteadiness on feet  Meningioma (HCC)  Muscle weakness (generalized)  Other lack of coordination  Difficulty in walking, not elsewhere classified  Right sided weakness  Rationale for Evaluation and Treatment: Rehabilitation  ONSET DATE: chronic   SUBJECTIVE:   SUBJECTIVE STATEMENT: Back is painful when he bends and twists. He is set to see the specialist soon. He has been doing his HEP every other day and feels like it is helping.   EVAL: Having issues with balance and walking. I do have a meningioma in my spine, and every once in awhile I have pain if I twist or stress it. Still having pain in my neck and hip from a fall that I had about a year and a half ago. Thought that pain would go away but it hasn't even this far out/being this healed. I have a lot of unsteadiness, not sure I'd call it dizziness but I have to stand for a minute before I get going. Have a couple of walking sticks/canes but not sure how to use them honestly. Entire R LE and UE are  still numb. When I fell I was at a friend's house and were leaving, about  an hour before that I had a glass of wine, I went out first and held the door for the next people but didn't realize that the concrete platform ended and dropped about 15 inches and ended up stepping off that 15 inch ledge and hit my head/shoulder/hip/knee on the wall beyond that. Meningioma is still just being monitored, saw the MD and they said its not growing but its just there in the spine. Every single movement I make with my neck hurts when I get to the end of that range.   PERTINENT HISTORY: See above- complex medical hx  PAIN:  Are you having pain? No 0/10 at rest, twisting and bending 3 0r 4 /10   PRECAUTIONS: Fall and Other: caution with movements, some movements can cause increased pressure/pain at meningioma site, ICD    RED FLAGS: None   WEIGHT BEARING RESTRICTIONS: No  FALLS:  Has patient fallen in last 6 months? Yes. Number of falls 1- had a mis-step coming off a ladder, fell backwards and hit the floor pretty hard  LIVING ENVIRONMENT: Lives with: lives with their spouse Lives in: House/apartment Stairs: 5 STE with B rails, has second floor in his home but holds rail    OCCUPATION: retired   PLOF: Independent, Independent with basic ADLs, Independent with gait, and Independent with transfers  PATIENT GOALS: getting more active, improving balance   NEXT MD VISIT: Referring scheduled but unsure when   OBJECTIVE:  Note: Objective measures were completed at Evaluation unless otherwise noted.    PATIENT SURVEYS:  ABC scale 65.6%; 12/13/23 65%  COGNITION: Overall cognitive status: Within functional limits for tasks assessed     SENSATION: Complete R UE and R LE numbness (chronic)    COORDINATION  Noted some ataxia in R LE with functional movements   LOWER EXTREMITY MMT:  MMT Right eval Left eval  Hip flexion 4+ 5  Hip extension    Hip abduction 5 5  Hip adduction    Hip internal rotation    Hip external rotation    Knee flexion 5 5  Knee  extension 5 5  Ankle dorsiflexion 5 5  Ankle plantarflexion    Ankle inversion    Ankle eversion     (Blank rows = not  tested)   FUNCTIONAL TESTS:  5 times sit to stand: 15.4 seconds use of UEs, some difficulty stabilizing; 11/20/23 15.2 seconds use of UEs  Timed up and go (TUG): 14.2 seconds no device but did need MinA for balance when initially standing and then turning to sit in the chair at end of test; 11/20/23 10 seconds  3 minute walk test: 64ft no device but needed MinA at times for balance on corners, RPE 4/10; 12/13/23 537ft no device distant S, RPE 3/10     12/13/23 0001   Standardized Balance Assessment   Standardized Balance Assessment Dynamic Gait Index   Dynamic Gait Index   Level Surface 2 2  Change in Gait Speed 2 2  Gait with Horizontal Head Turns 2 2  Gait with Vertical Head Turns 2 2  Gait and Pivot Turn 1 1  Step Over Obstacle 1 1  Step Around Obstacles 1 2  Steps 0 2  Total Score 11 14      GAIT: Distance walked: 614ft Assistive device utilized: None Level of assistance: Min A Comments: MinA for balance with turns and when navigating obstacles, some ataxic movements noted                                                                                                                                 TREATMENT DATE:  01/18/24 NuStep L5x73mins In bars side stepping over beams  Cone taps in bars On airex reaching for numbers on wall  Side steps on beam  3# marching, hip ext, and hip abd x10  01/16/24 Nustep L5 SLS on 6in box with head swivel 6in step up  x2 each leg   S2S from elevated table x10 Ball toss on level surface Bars   Tandam walking Stand on airex  Weight shift on airex Lateral stepping on beam SLS taping color cones   01/09/24 Bike L 4 x 1 in stopped due to R knee pain NuStep L 5 x 6 min Step ups 6" min A  Lateral 4in step ups some UE assit x10 Standing on airex weighted ball toss  Sit to stand LE on airex 2x10 elevated  surface  Tandem walking in // barx Side steps on airex in // bars  01/04/24 NuStep L5x 7 mins DGI Resisted gait all directions 30lb x4 each seated rest needed during  Sit to stands LE on airex elevated surface 2x10 Alt 4in box taps 2x10  HS curls 35lb 2x10 Leg Ext 10lb 2x10  01/02/24 NuStep L5x40mins Step ups 6" minA  Step ups on airex minA and some use of handrail Walking on beam needs to hold on in bars Standing arm ext 10# 2x10 AR press 10# 2x10 STS from elevated mat 2x5   12/28/23 Bike L3 x43mins  HS curls 25# 2x12 Leg ext 10# 2x12 Side steps by counter  Standing march requires light UE support  Standing rows and ext green band 2x10  STS 2x10 from elevated mat table  Ball toss 2x10   12/26/23 Bike L 3 x 7 min HS curls 25lb 2x15  Sides steps at [employer] set with standing marches very unstable  Alt 4in box taps  Standing ball toss  On airex  Eyes closed   12/21/23  Hooklying TA sets 15x3 seconds  PPT 15x3 seconds TA set + alternating march from hooklying x20  Reviewed and enforced BLT precautions for his back until MRI can clarify what is happening- he often pushes through pain and encouraged him not to do this to avoid worsening of condition    Nustep L3x8 minutes (dropped resistance due to R knee pain) BLEs only  One foot on BOSU/one foot on solid ground 3x30 seconds (harder for him today)   12/19/23 NuStep L 5 x 7 min seat back for decrease knee flex  Alt 4in box taps 2x10 Side step at mat table  Sit to stand LE on airex elevated surface some UE use 2x10 On airex reaching outside [employer]. Ball kicks   Combo w/ toss and kicks Pball roll outs  Heel raises     PATIENT EDUCATION:  Education details: exam findings, POC Person educated: Patient Education method: Explanation, Demonstration, and Verbal cues Education comprehension: verbalized understanding, returned demonstration, and needs further education  HOME EXERCISE  PROGRAM:  Access Code: [email or web address] URL: [email or web address] Date: 12/21/2023 Prepared by: Terrel Ferries  Exercises - Wide Tandem Stance with Eyes Open  - 1 x daily - 7 x weekly - 2 sets - 3 reps - 30 seconds  hold - Romberg Stance  - 1 x daily - 7 x weekly - 2 sets - 3 reps - 30 second  hold - Single Leg Stance with Support  - 1 x daily - 7 x weekly - 2 sets - 3 reps - Supine Transversus Abdominis Bracing - Hands on Stomach  - 5 x daily - 7 x weekly - 1 sets - 10 reps - 3 seconds  hold - Supine Posterior Pelvic Tilt  - 2 x daily - 7 x weekly - 1 sets - 10 reps - 3 seconds  hold - Supine March  - 1 x daily - 7 x weekly - 3 sets - 10 reps    ASSESSMENT:  CLINICAL IMPRESSION: We continued to work on functional interventions that challenges his balance. He is very wobbly with cone taps today, and gets frustrated that he is unable to do a simple task without ease. He reports he is doing some work in his yard, was advised to try to take his cane or have his wife close by to help in case he loses his balance. Min to Moderate assistance needed with all tasks today especially when on foam surfaces. Reports some pain in L hip with abduction kicks.    EVAL: Patient is a 78 y.o. M who was seen today for physical therapy evaluation and treatment for Free Text Diagnosis R29.6, R26.89, R26.9 Mult. Falls, Poor Balance, Gait Disturbance. Very pleasant and cooperative but very talkative during eval which did limit assessment time. Still having a lot of pain in general since his fall  and ongoing numbness in R UE/LE. Gait mechanics and balance are definitely impaired, would benefit from training with appropriate AD if he is open to this. Will benefit from skilled PT services to address his concerns and functional objective impairments to the best of our ability.   OBJECTIVE IMPAIRMENTS: Abnormal gait, cardiopulmonary status  limiting activity, decreased activity tolerance, decreased balance,  decreased mobility, and difficulty walking.   ACTIVITY LIMITATIONS: standing, squatting, stairs, transfers, reach over head, and locomotion level  PARTICIPATION LIMITATIONS: driving, shopping, community activity, and occupation  PERSONAL FACTORS: Age, Behavior pattern, Fitness, Past/current experiences, Social background, and Time since onset of injury/illness/exacerbation are also affecting patient's functional outcome.   REHAB POTENTIAL: Good  CLINICAL DECISION MAKING: Evolving/moderate complexity  EVALUATION COMPLEXITY: Moderate   GOALS: Goals reviewed with patient? No  SHORT TERM GOALS: Target date: 11/23/2023   Will be compliant with appropriate progressive HEP  Baseline: Goal status: MET 11/23/23  2.  Will be able to name 3 ways to prevent falls at home and in the community  Baseline:  Goal status: MET 11/23/23  3.  Will be able to perform 5xSTS and with distant S Baseline: MinA  Goal status: 12/13/23 MET   4.  Will complete TUG test in 12 seconds or less S  Baseline: MinA  Goal status: MET 11/20/23    LONG TERM GOALS: Target date: 02/01/2024      Will score at least 18 on DGI to show improved functional balance  Baseline:  Goal status: ONGOING 12/13/23 DGI 11/24, 01/04/24 progressing  14/30  2.  Will be able to ambulate community distances (level and inclines) no device, RPE no more than 2/10  Baseline:  Goal status: ONGOING 12/13/23 (RPE 6/10)  3.  Will be able to perform all desired gym activities without increased pain from resting levels  Baseline:  Goal status: ONGOING 12/13/23 improving   4.  ABC score to improve by at least 20 points to show improved subjective status  Baseline:  Goal status: ONGOING 12/13/23    PLAN:  PT FREQUENCY: 2x/week  PT DURATION: 4 weeks  PLANNED INTERVENTIONS: [id number]- PT Re-evaluation, 97110-Therapeutic exercises, 97530- Therapeutic activity, 97112- Neuromuscular re-education, 97535- Self Care, [id number]- Manual therapy,  [id number]- Gait training, [phone]- Aquatic Therapy, and Balance training  PLAN FOR NEXT SESSION: balance and coordination progressions with compliant surfaces , functional activity tolerance.   Laurelyn Ponder, SPTA 01/18/24 12:35 PM

## 2024-01-16 NOTE — Therapy (Signed)
 OUTPATIENT PHYSICAL THERAPY LOWER EXTREMITY TREATMENT  Patient Name: Timothy Moran MRN: [id number] DOB:12-23-1945, 78 y.o., male Today's Date: 01/16/2024      END OF SESSION:  PT End of Session - 01/16/24 1427     Visit Number 18    Number of Visits 17    Date for PT Re-Evaluation 01/10/24    Authorization Type MCR    PT Start Time 1430    PT Stop Time 1515    PT Time Calculation (min) 45 min                     Past Medical History:  Diagnosis Date   Acute blood loss anemia 10/05/2020   Acute respiratory failure (HCC), hypothermia therapy, vent - extubated 10/06/20    AKI (acute kidney injury) (HCC) 10/05/2020   Anoxic brain injury (HCC) 10/26/2020   Arrhythmia    Ascending aortic aneurysm (HCC) 01/29/2018   a. 2019 4.3 cm by echo; b. 02/2021 Echo: Ao root 40mm.   Benign brain tumor (HCC)    Benign neoplasm of brain (HCC) 12/19/2017   Cardiac arrest with ventricular fibrillation (HCC)    Chest pain 12/04/2017   Chronic kidney disease, stage III (moderate) (HCC) 12/19/2017   CKD (chronic kidney disease), stage III - IV (HCC)    Conductive hearing loss of both ears 08/24/2018   Coronary Artery Disease    a. 09/2020 Inf STEMI complicated by cardiac arrest/PCI: LAD 50p/m, LCX 100d (2.5 x 30 Resolute Onyx DES); b. 04/2021 PCI: 04/2021 LM nl, LAD 50p/49m (2.5x30 Onyx Frontier DES), LCX 25p, 41m (RFR 0.98), 40d ISR (RFR 0.98), OM2 25, RCA mild diff dzs.   Diabetes mellitus, type 2 (HCC) 10/06/2020   10/06/20 A1C 7%   Dizziness 10/11/2019   Encounter for imaging study to confirm orogastric (OG) tube placement    Eustachian tube dysfunction 08/24/2018   Hematuria 10/26/2020   HFrEF (heart failure with reduced ejection fraction) (HCC)    a. 11/2020 Echo: EF 25-30%; b. 02/2021 Echo: EF 25-30%, glob HK. Mild LVH. Nl RV fxn. Triv AI. Ao root 40mm.   History of pulmonary embolus (PE)    HLD (hyperlipidemia) 10/26/2020   Hypertension    Iron deficiency anemia rec'd IV iron  10/26/2020   Ischemic cardiomyopathy    a. 11/2020 Echo: EF 25-30%; b. 02/2021 Echo: EF 25-30%, glob HK.   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 Aaron AasSpirometry 03/08/2018    FEV1 3.86 (108%)  Ratio 96 s prior rx  - PET  03/13/18   Low grade c/w adenoca > rec T surgery eval     STEMI (ST elevation myocardial infarction) (HCC) 10/04/2020   Urinary retention 10/26/2020   Vestibular schwannoma (HCC) 08/14/2020   Past Surgical History:  Procedure Laterality Date   APPENDECTOMY  BIV ICD INSERTION CRT-D N/A 11/03/2021   Procedure: BIV ICD INSERTION CRT-D;  Surgeon: Lei Pump, MD;  Location: [facility] INVASIVE CV LAB;  Service: Cardiovascular;  Laterality: N/A;   CARDIAC CATHETERIZATION     CORONARY ANGIOPLASTY WITH STENT PLACEMENT Left 04/12/2021   LAD stent placement   CORONARY PRESSURE/FFR STUDY N/A 04/12/2021   Procedure: INTRAVASCULAR PRESSURE WIRE/FFR STUDY;  Surgeon: Sammy Crisp, MD;  Location: [facility] INVASIVE CV LAB;  Service: Cardiovascular;  Laterality: N/A;   CORONARY STENT INTERVENTION N/A 04/12/2021   Procedure: CORONARY STENT INTERVENTION;  Surgeon: Sammy Crisp, MD;  Location: [facility] INVASIVE CV LAB;  Service: Cardiovascular;  Laterality: N/A;   CORONARY ULTRASOUND/IVUS N/A 04/12/2021   Procedure: Intravascular Ultrasound/IVUS;  Surgeon: Sammy Crisp, MD;   Location: [facility] INVASIVE CV LAB;  Service: Cardiovascular;  Laterality: N/A;   CORONARY/GRAFT ACUTE MI REVASCULARIZATION N/A 10/04/2020   Procedure: Coronary/Graft Acute MI Revascularization;  Surgeon: Sammy Crisp, MD;  Location: [facility] INVASIVE CV LAB;  Service: Cardiovascular;  Laterality: N/A;   HERNIA REPAIR     LEFT HEART CATH AND CORONARY ANGIOGRAPHY N/A 10/04/2020   Procedure: LEFT HEART CATH AND CORONARY ANGIOGRAPHY;  Surgeon: Sammy Crisp, MD;  Location: [facility] INVASIVE CV LAB;  Service: Cardiovascular;  Laterality: N/A;   RIGHT/LEFT HEART CATH AND CORONARY ANGIOGRAPHY N/A 04/12/2021   Procedure: RIGHT/LEFT HEART CATH AND CORONARY ANGIOGRAPHY;  Surgeon: Sammy Crisp, MD;  Location: [facility] INVASIVE CV LAB;  Service: Cardiovascular;  Laterality: N/A;   Patient Active Problem List   Diagnosis Date Noted   Anemia    Leukocytosis    Right arm weakness 01/25/2022   Right sided weakness    Fall at home 01/22/2022   Status post biventricular pacemaker - MRI compatible PPM and leads 01/22/2022   Chronic indwelling Foley catheter 01/22/2022   CKD (chronic kidney disease), stage III - IV (HCC) 06/28/2021   Chronic HFrEF (heart failure with reduced ejection fraction) (HCC) 03/31/2021   Arrhythmia    DM (diabetes mellitus), type 2 (HCC) new 10/26/2020   Urinary retention 10/26/2020   Hyperlipidemia LDL goal <70 10/26/2020   Coronary artery disease of native artery of native heart with stable angina pectoris (HCC) 10/26/2020   S/P angioplasty with stent 10/04/20 DES to LCX  10/26/2020   Ischemic cardiomyopathy 10/26/2020   Diabetes mellitus, type 2 (HCC) 10/06/2020   DOE (dyspnea on exertion)    Meningioma (HCC) 08/14/2020   Vestibular schwannoma (HCC) 08/14/2020   Cardiac arrest with ventricular fibrillation (HCC)    Pain of left hip joint 05/29/2020   Benign brain tumor (HCC)    Chronic kidney disease    History of pulmonary embolus (PE)    Hypertension    Nonsustained ventricular  tachycardia (HCC) 10/11/2019   Dizziness 10/11/2019   Abnormal echocardiogram 06/07/2019   Eustachian tube dysfunction 08/24/2018   Conductive hearing loss of both ears 08/24/2018   Pulmonary hypertension due to thromboembolism (HCC) 03/09/2018   Solitary pulmonary nodule on lung CT 03/08/2018   Ascending aortic aneurysm (HCC) 4.2 cm based on CT from December 2020 01/29/2018   Sinus bradycardia 12/22/2017   Left bundle branch block 12/19/2017   Personal history of pulmonary embolism - no longer on anticoagulation due to pt refusal to take it anymore. 12/19/2017   Chronic kidney disease, stage III (moderate) (HCC) 12/19/2017   Benign neoplasm of brain (HCC) 12/19/2017    PCP: Helyn Lobstein, MD  REFERRING PROVIDER: Helyn Lobstein, MD  REFERRING DIAG: Free Text Diagnosis R29.6, R26.89, R26.9 Mult. Falls,  Poor Balance, Gait Disturbance  THERAPY DIAG:  Unsteadiness on feet  Muscle weakness (generalized)  Other lack of coordination  Difficulty in walking, not elsewhere classified  Right sided weakness  Meningioma (HCC)  Poor body mechanics  Fall in home, initial encounter  Right shoulder pain, unspecified chronicity  Rationale for Evaluation and Treatment: Rehabilitation  ONSET DATE: chronic   SUBJECTIVE:   SUBJECTIVE STATEMENT: Back is painful when he bends and twists. He is set to see the specialist soon. He has been doing his HEP every other day and feels like it is helping.   EVAL: Having issues with balance and walking. I do have a meningioma in my spine, and every once in awhile I have pain if I twist or stress it. Still having pain in my neck and hip from a fall that I had about a year and a half ago. Thought that pain would go away but it hasn't even this far out/being this healed. I have a lot of unsteadiness, not sure I'd call it dizziness but I have to stand for a minute before I get going. Have a couple of walking sticks/canes but not sure how to use them  honestly. Entire R LE and UE are  still numb. When I fell I was at a friend's house and were leaving, about an hour before that I had a glass of wine, I went out first and held the door for the next people but didn't realize that the concrete platform ended and dropped about 15 inches and ended up stepping off that 15 inch ledge and hit my head/shoulder/hip/knee on the wall beyond that. Meningioma is still just being monitored, saw the MD and they said its not growing but its just there in the spine. Every single movement I make with my neck hurts when I get to the end of that range.   PERTINENT HISTORY: See above- complex medical hx  PAIN:  Are you having pain? No 0/10 at rest, twisting and bending 3 0r 4 /10   PRECAUTIONS: Fall and Other: caution with movements, some movements can cause increased pressure/pain at meningioma site, ICD    RED FLAGS: None   WEIGHT BEARING RESTRICTIONS: No  FALLS:  Has patient fallen in last 6 months? Yes. Number of falls 1- had a mis-step coming off a ladder, fell backwards and hit the floor pretty hard  LIVING ENVIRONMENT: Lives with: lives with their spouse Lives in: House/apartment Stairs:  5 STE with B rails, has second floor in his home but holds rail    OCCUPATION: retired   PLOF: Independent, Independent with basic ADLs, Independent with gait, and Independent with transfers  PATIENT GOALS: getting more active, improving balance   NEXT MD VISIT: Referring scheduled but unsure when   OBJECTIVE:  Note: Objective measures were completed at Evaluation unless otherwise noted.    PATIENT SURVEYS:  ABC scale 65.6%; 12/13/23 65%  COGNITION: Overall cognitive status: Within functional limits for tasks assessed     SENSATION: Complete R UE and R LE numbness (chronic)    COORDINATION  Noted some ataxia in R LE with functional movements   LOWER EXTREMITY MMT:  MMT Right eval Left eval  Hip flexion 4+ 5  Hip extension    Hip  abduction 5 5  Hip adduction    Hip internal rotation    Hip external rotation    Knee flexion 5 5  Knee extension 5 5  Ankle dorsiflexion 5 5  Ankle plantarflexion  Ankle inversion    Ankle eversion     (Blank rows = not tested)   FUNCTIONAL TESTS:  5 times sit to stand: 15.4 seconds use of UEs, some difficulty stabilizing; 11/20/23 15.2 seconds use of UEs  Timed up and go (TUG): 14.2 seconds no device but did need MinA for balance when initially standing and then turning to sit in the chair at end of test; 11/20/23 10 seconds  3 minute walk test: 642ft no device but needed MinA at times for balance on corners, RPE 4/10; 12/13/23 518ft no device distant S, RPE 3/10     12/13/23 0001   Standardized Balance Assessment   Standardized Balance Assessment Dynamic Gait Index   Dynamic Gait Index   Level Surface 2 2  Change in Gait Speed 2 2  Gait with Horizontal Head Turns 2 2  Gait with Vertical Head Turns 2 2  Gait and Pivot Turn 1 1  Step Over Obstacle 1 1  Step Around Obstacles 1 2  Steps 0 2  Total Score 11 14      GAIT: Distance walked: 68ft Assistive device utilized: None Level of assistance: Min A Comments: MinA for balance with turns and when navigating obstacles, some ataxic movements noted                                                                                                                                 TREATMENT DATE:  01/16/24 Nustep L5 SLS on 6in box with head swivel 6in step up  x2 each leg   S2S from elevated table x10 Ball toss on level surface Bars   Tandam walking Stand on airex  Weight shift on airex Lateral stepping on beam SLS taping color cones    01/09/24 Bike L 4 x 1 in stopped due to R knee pain NuStep L 5 x 6 min Step ups 6" min A  Lateral 4in step ups some UE assit x10 Standing on airex weighted ball toss  Sit to stand LE on airex 2x10 elevated surface  Tandem walking in // barx Side steps on airex in //  bars  01/04/24 NuStep L5x 7 mins DGI Resisted gait all directions 30lb x4 each seated rest needed during  Sit to stands LE on airex elevated surface 2x10 Alt 4in box taps 2x10  HS curls 35lb 2x10 Leg Ext 10lb 2x10  01/02/24 NuStep L5x27mins Step ups 6" minA  Step ups on airex minA and some use of handrail Walking on beam needs to hold on in bars Standing arm ext 10# 2x10 AR press 10# 2x10 STS from elevated mat 2x5   12/28/23 Bike L3 x82mins  HS curls 25# 2x12 Leg ext 10# 2x12 Side steps by counter  Standing march requires light UE support  Standing rows and ext green band 2x10 STS 2x10 from elevated mat table  Ball toss 2x10   12/26/23 Bike L 3 x 7 min HS  curls 25lb 2x15  Sides steps at [employer] set with standing marches very unstable  Alt 4in box taps  Standing ball toss  On airex  Eyes closed   12/21/23  Hooklying TA sets 15x3 seconds  PPT 15x3 seconds TA set + alternating march from hooklying x20  Reviewed and enforced BLT precautions for his back until MRI can clarify what is happening- he often pushes through pain and encouraged him not to do this to avoid worsening of condition    Nustep L3x8 minutes (dropped resistance due to R knee pain) BLEs only  One foot on BOSU/one foot on solid ground 3x30 seconds (harder for him today)   12/19/23 NuStep L 5 x 7 min seat back for decrease knee flex  Alt 4in box taps 2x10 Side step at mat table  Sit to stand LE on airex elevated surface some UE use 2x10 On airex reaching outside [employer]. Ball kicks   Combo w/ toss and kicks Pball roll outs  Heel raises     PATIENT EDUCATION:  Education details: exam findings, POC Person educated: Patient Education method: Explanation, Demonstration, and Verbal cues Education comprehension: verbalized understanding, returned demonstration, and needs further education  HOME EXERCISE PROGRAM:  Access Code: [email or web address] URL:  [email or web address] Date: 12/21/2023 Prepared by: Terrel Ferries  Exercises - Wide Tandem Stance with Eyes Open  - 1 x daily - 7 x weekly - 2 sets - 3 reps - 30 seconds  hold - Romberg Stance  - 1 x daily - 7 x weekly - 2 sets - 3 reps - 30 second  hold - Single Leg Stance with Support  - 1 x daily - 7 x weekly - 2 sets - 3 reps - Supine Transversus Abdominis Bracing - Hands on Stomach  - 5 x daily - 7 x weekly - 1 sets - 10 reps - 3 seconds  hold - Supine Posterior Pelvic Tilt  - 2 x daily - 7 x weekly - 1 sets - 10 reps - 3 seconds  hold - Supine March  - 1 x daily - 7 x weekly - 3 sets - 10 reps    ASSESSMENT:  CLINICAL IMPRESSION: Again pt arrives today with ongoing back pain with bending and twisting.  We continued to work on functional interventions that challenges his balance. Instability present with all activities, but he needed min assist to regain balance. He feels like HEP is helping with stability, so I encouraged him to do it daily.   EVAL: Patient is a 78 y.o. M who was seen today for physical therapy evaluation and treatment for Free Text Diagnosis R29.6, R26.89, R26.9 Mult. Falls, Poor Balance, Gait Disturbance. Very pleasant and cooperative but very talkative during eval which did limit assessment time. Still having a lot of pain in general since his fall  and ongoing numbness in R UE/LE. Gait mechanics and balance are definitely impaired, would benefit from training with appropriate AD if he is open to this. Will benefit from skilled PT services to address his concerns and functional objective impairments to the best of our ability.   OBJECTIVE IMPAIRMENTS: Abnormal gait, cardiopulmonary status limiting activity, decreased activity tolerance, decreased balance, decreased mobility, and difficulty walking.   ACTIVITY LIMITATIONS: standing, squatting, stairs, transfers, reach over head, and locomotion level  PARTICIPATION LIMITATIONS: driving, shopping,  community activity, and occupation  PERSONAL FACTORS: Age, Behavior pattern, Fitness, Past/current experiences, Social background, and Time since onset of injury/illness/exacerbation are also affecting  patient's functional outcome.   REHAB POTENTIAL: Good  CLINICAL DECISION MAKING: Evolving/moderate complexity  EVALUATION COMPLEXITY: Moderate   GOALS: Goals reviewed with patient? No  SHORT TERM GOALS: Target date: 11/23/2023   Will be compliant with appropriate progressive HEP  Baseline: Goal status: MET 11/23/23  2.  Will be able to name 3 ways to prevent falls at home and in the community  Baseline:  Goal status: MET 11/23/23  3.  Will be able to perform 5xSTS and with distant S Baseline: MinA  Goal status: 12/13/23 MET   4.  Will complete TUG test in 12 seconds or less S  Baseline: MinA  Goal status: MET 11/20/23    LONG TERM GOALS: Target date: 01/10/2024      Will score at least 18 on DGI to show improved functional balance  Baseline:  Goal status: ONGOING 12/13/23 DGI 11/24, 01/04/24 progressing  14/30  2.  Will be able to ambulate community distances (level and inclines) no device, RPE no more than 2/10  Baseline:  Goal status: ONGOING 12/13/23 (RPE 6/10)  3.  Will be able to perform all desired gym activities without increased pain from resting levels  Baseline:  Goal status: ONGOING 12/13/23 improving   4.  ABC score to improve by at least 20 points to show improved subjective status  Baseline:  Goal status: ONGOING 12/13/23    PLAN:  PT FREQUENCY: 2x/week  PT DURATION: 4 weeks  PLANNED INTERVENTIONS: 97164- PT Re-evaluation, 97110-Therapeutic exercises, 97530- Therapeutic activity, 97112- Neuromuscular re-education, 97535- Self Care, [id number]- Manual therapy, [id number]- Gait training, [phone]- Aquatic Therapy, and Balance training  PLAN FOR NEXT SESSION: balance and coordination progressions with compliant surfaces , functional activity tolerance.   Laurelyn Ponder, SPTA 01/16/24 2:28 PM

## 2024-01-18 ENCOUNTER — Ambulatory Visit

## 2024-01-18 DIAGNOSIS — D329 Benign neoplasm of meninges, unspecified: Secondary | ICD-10-CM

## 2024-01-18 DIAGNOSIS — R531 Weakness: Secondary | ICD-10-CM

## 2024-01-18 DIAGNOSIS — R262 Difficulty in walking, not elsewhere classified: Secondary | ICD-10-CM

## 2024-01-18 DIAGNOSIS — R278 Other lack of coordination: Secondary | ICD-10-CM

## 2024-01-18 DIAGNOSIS — M6281 Muscle weakness (generalized): Secondary | ICD-10-CM

## 2024-01-18 DIAGNOSIS — R2681 Unsteadiness on feet: Secondary | ICD-10-CM

## 2024-01-18 DIAGNOSIS — R296 Repeated falls: Secondary | ICD-10-CM | POA: Diagnosis not present

## 2024-01-18 DIAGNOSIS — R2689 Other abnormalities of gait and mobility: Secondary | ICD-10-CM | POA: Diagnosis not present

## 2024-01-23 ENCOUNTER — Encounter: Payer: Self-pay | Admitting: Physical Therapy

## 2024-01-23 ENCOUNTER — Ambulatory Visit: Admitting: Physical Therapy

## 2024-01-23 DIAGNOSIS — R278 Other lack of coordination: Secondary | ICD-10-CM

## 2024-01-23 DIAGNOSIS — R2689 Other abnormalities of gait and mobility: Secondary | ICD-10-CM | POA: Diagnosis not present

## 2024-01-23 DIAGNOSIS — R2681 Unsteadiness on feet: Secondary | ICD-10-CM

## 2024-01-23 DIAGNOSIS — D329 Benign neoplasm of meninges, unspecified: Secondary | ICD-10-CM

## 2024-01-23 DIAGNOSIS — R296 Repeated falls: Secondary | ICD-10-CM | POA: Diagnosis not present

## 2024-01-23 DIAGNOSIS — M6281 Muscle weakness (generalized): Secondary | ICD-10-CM

## 2024-01-23 DIAGNOSIS — R262 Difficulty in walking, not elsewhere classified: Secondary | ICD-10-CM

## 2024-01-23 NOTE — Therapy (Signed)
 OUTPATIENT PHYSICAL THERAPY LOWER EXTREMITY TREATMENT Progress Note Reporting Period 12/19/23 to 01/23/24 for visits 11-20  See note below for Objective Data and Assessment of Progress/Goals.     Patient Name: Momin Pienkowski MRN: [id number] DOB:June 19, 1946, 78 y.o., male Today's Date: 01/23/2024      END OF SESSION:  PT End of Session - 01/23/24 1055     Visit Number 20    Date for PT Re-Evaluation 02/01/24    PT Start Time 1055    PT Stop Time 1140    PT Time Calculation (min) 45 min    Activity Tolerance Patient tolerated treatment well    Behavior During Therapy [facility] for tasks assessed/performed                      Past Medical History:  Diagnosis Date   Acute blood loss anemia 10/05/2020   Acute respiratory failure (HCC), hypothermia therapy, vent - extubated 10/06/20    AKI (acute kidney injury) (HCC) 10/05/2020   Anoxic brain injury (HCC) 10/26/2020   Arrhythmia    Ascending aortic aneurysm (HCC) 01/29/2018   a. 2019 4.3 cm by echo; b. 02/2021 Echo: Ao root 40mm.   Benign brain tumor (HCC)    Benign neoplasm of brain (HCC) 12/19/2017   Cardiac arrest with ventricular fibrillation (HCC)    Chest pain 12/04/2017   Chronic kidney disease, stage III (moderate) (HCC) 12/19/2017   CKD (chronic kidney disease), stage III - IV (HCC)    Conductive hearing loss of both ears 08/24/2018   Coronary Artery Disease    a. 09/2020 Inf STEMI complicated by cardiac arrest/PCI: LAD 50p/m, LCX 100d (2.5 x 30 Resolute Onyx DES); b. 04/2021 PCI: 04/2021 LM nl, LAD 50p/34m (2.5x30 Onyx Frontier DES), LCX 25p, 3m (RFR 0.98), 40d ISR (RFR 0.98), OM2 25, RCA mild diff dzs.   Diabetes mellitus, type 2 (HCC) 10/06/2020   10/06/20 A1C 7%   Dizziness 10/11/2019   Encounter for imaging study to confirm orogastric (OG) tube placement    Eustachian tube dysfunction 08/24/2018   Hematuria 10/26/2020   HFrEF (heart failure with reduced ejection fraction) (HCC)    a. 11/2020 Echo: EF  25-30%; b. 02/2021 Echo: EF 25-30%, glob HK. Mild LVH. Nl RV fxn. Triv AI. Ao root 40mm.   History of pulmonary embolus (PE)    HLD (hyperlipidemia) 10/26/2020   Hypertension    Iron deficiency anemia rec'd IV iron 10/26/2020   Ischemic cardiomyopathy    a. 11/2020 Echo: EF 25-30%; b. 02/2021 Echo: EF 25-30%, glob HK.   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 Aaron AasSpirometry 03/08/2018    FEV1 3.86 (108%)  Ratio 96 s prior rx  - PET  03/13/18   Low grade c/w adenoca > rec T surgery eval     STEMI (  ST elevation myocardial infarction) (HCC) 10/04/2020   Urinary retention 10/26/2020   Vestibular schwannoma (HCC) 08/14/2020   Past Surgical History:  Procedure Laterality Date   APPENDECTOMY     BIV ICD INSERTION CRT-D N/A 11/03/2021   Procedure: BIV ICD INSERTION CRT-D;  Surgeon: Lei Pump, MD;  Location: [facility] INVASIVE CV LAB;  Service: Cardiovascular;  Laterality: N/A;   CARDIAC CATHETERIZATION     CORONARY ANGIOPLASTY WITH STENT PLACEMENT Left 04/12/2021   LAD stent placement   CORONARY PRESSURE/FFR STUDY N/A 04/12/2021   Procedure: INTRAVASCULAR PRESSURE WIRE/FFR STUDY;  Surgeon: Sammy Crisp, MD;  Location: [facility] INVASIVE CV LAB;  Service: Cardiovascular;  Laterality: N/A;   CORONARY STENT INTERVENTION N/A 04/12/2021   Procedure: CORONARY STENT INTERVENTION;   Surgeon: Sammy Crisp, MD;  Location: [facility] INVASIVE CV LAB;  Service: Cardiovascular;  Laterality: N/A;   CORONARY ULTRASOUND/IVUS N/A 04/12/2021   Procedure: Intravascular Ultrasound/IVUS;  Surgeon: Sammy Crisp, MD;  Location: [facility] INVASIVE CV LAB;  Service: Cardiovascular;  Laterality: N/A;   CORONARY/GRAFT ACUTE MI REVASCULARIZATION N/A 10/04/2020   Procedure: Coronary/Graft Acute MI Revascularization;  Surgeon: Sammy Crisp, MD;  Location: [facility] INVASIVE CV LAB;  Service: Cardiovascular;  Laterality: N/A;   HERNIA REPAIR     LEFT HEART CATH AND CORONARY ANGIOGRAPHY N/A 10/04/2020   Procedure: LEFT HEART CATH AND CORONARY ANGIOGRAPHY;  Surgeon: Sammy Crisp, MD;  Location: [facility] INVASIVE CV LAB;  Service: Cardiovascular;  Laterality: N/A;   RIGHT/LEFT HEART CATH AND CORONARY ANGIOGRAPHY N/A 04/12/2021   Procedure: RIGHT/LEFT HEART CATH AND CORONARY ANGIOGRAPHY;  Surgeon: Sammy Crisp, MD;  Location: [facility] INVASIVE CV LAB;  Service: Cardiovascular;  Laterality: N/A;   Patient Active Problem List   Diagnosis Date Noted   Anemia    Leukocytosis    Right arm weakness 01/25/2022   Right sided weakness    Fall at home 01/22/2022   Status post biventricular pacemaker - MRI compatible PPM and leads 01/22/2022   Chronic indwelling Foley catheter 01/22/2022   CKD (chronic kidney disease), stage III - IV (HCC) 06/28/2021   Chronic HFrEF (heart failure with reduced ejection fraction) (HCC) 03/31/2021   Arrhythmia    DM (diabetes mellitus), type 2 (HCC) new 10/26/2020   Urinary retention 10/26/2020   Hyperlipidemia LDL goal <70 10/26/2020   Coronary artery disease of native artery of native heart with stable angina pectoris (HCC) 10/26/2020   S/P angioplasty with stent 10/04/20 DES to LCX  10/26/2020   Ischemic cardiomyopathy 10/26/2020   Diabetes mellitus, type 2 (HCC) 10/06/2020   DOE (dyspnea on exertion)    Meningioma (HCC) 08/14/2020   Vestibular schwannoma (HCC) 08/14/2020   Cardiac  arrest with ventricular fibrillation (HCC)    Pain of left hip joint 05/29/2020   Benign brain tumor (HCC)    Chronic kidney disease    History of pulmonary embolus (PE)    Hypertension    Nonsustained ventricular tachycardia (HCC) 10/11/2019   Dizziness 10/11/2019   Abnormal echocardiogram 06/07/2019   Eustachian tube dysfunction 08/24/2018   Conductive hearing loss of both ears 08/24/2018   Pulmonary hypertension due to thromboembolism (HCC) 03/09/2018   Solitary pulmonary nodule on lung CT 03/08/2018   Ascending aortic aneurysm (HCC) 4.2 cm based on CT from December 2020 01/29/2018   Sinus bradycardia 12/22/2017   Left bundle branch block 12/19/2017   Personal history of pulmonary embolism - no longer on anticoagulation due to pt refusal to take it anymore. 12/19/2017   Chronic kidney disease, stage III (moderate) (HCC)  12/19/2017   Benign neoplasm of brain (HCC) 12/19/2017    PCP: Helyn Lobstein, MD  REFERRING PROVIDER: Helyn Lobstein, MD  REFERRING DIAG: Free Text Diagnosis R29.6, R26.89, R26.9 Mult. Falls, Poor Balance, Gait Disturbance  THERAPY DIAG:  Unsteadiness on feet  Meningioma (HCC)  Muscle weakness (generalized)  Other lack of coordination  Difficulty in walking, not elsewhere classified  Repeated falls  Rationale for Evaluation and Treatment: Rehabilitation  ONSET DATE: chronic   SUBJECTIVE:   SUBJECTIVE STATEMENT: "About th same" Really dizzy on Sunday , had to lay down a lot   EVAL: Having issues with balance and walking. I do have a meningioma in my spine, and every once in awhile I have pain if I twist or stress it. Still having pain in my neck and hip from a fall that I had about a year and a half ago. Thought that pain would go away but it hasn't even this far out/being this healed. I have a lot of unsteadiness, not sure I'd call it dizziness but I have to stand for a minute before I get going. Have a couple of walking sticks/canes but not sure  how to use them honestly. Entire R LE and UE are  still numb. When I fell I was at a friend's house and were leaving, about an hour before that I had a glass of wine, I went out first and held the door for the next people but didn't realize that the concrete platform ended and dropped about 15 inches and ended up stepping off that 15 inch ledge and hit my head/shoulder/hip/knee on the wall beyond that. Meningioma is still just being monitored, saw the MD and they said its not growing but its just there in the spine. Every single movement I make with my neck hurts when I get to the end of that range.   PERTINENT HISTORY: See above- complex medical hx  PAIN:  Are you having pain? No 0/10 at rest, twisting and bending 3 0r 4 /10   PRECAUTIONS: Fall and Other: caution with movements, some movements can cause increased pressure/pain at meningioma site, ICD    RED FLAGS: None   WEIGHT BEARING RESTRICTIONS: No  FALLS:  Has patient fallen in last 6 months? Yes. Number of falls 1- had a mis-step coming off a ladder, fell backwards and hit the floor pretty hard  LIVING ENVIRONMENT: Lives with: lives with their spouse Lives in: House/apartment Stairs: 5 STE with B rails, has second floor in his home but holds rail    OCCUPATION: retired   PLOF: Independent, Independent with basic ADLs, Independent with gait, and Independent with transfers  PATIENT GOALS: getting more active, improving balance   NEXT MD VISIT: Referring scheduled but unsure when   OBJECTIVE:  Note: Objective measures were completed at Evaluation unless otherwise noted.    PATIENT SURVEYS:  ABC scale 65.6%; 12/13/23 65%  COGNITION: Overall cognitive status: Within functional limits for tasks assessed     SENSATION: Complete R UE and R LE numbness (chronic)    COORDINATION  Noted some ataxia in R LE with functional movements   LOWER EXTREMITY MMT:  MMT Right eval Left eval  Hip flexion 4+ 5  Hip extension     Hip abduction 5 5  Hip adduction    Hip internal rotation    Hip external rotation    Knee flexion 5 5  Knee extension 5 5  Ankle dorsiflexion 5 5  Ankle plantarflexion  Ankle inversion    Ankle eversion     (Blank rows = not tested)   FUNCTIONAL TESTS:  5 times sit to stand: 15.4 seconds use of UEs, some difficulty stabilizing; 11/20/23 15.2 seconds use of UEs  Timed up and go (TUG): 14.2 seconds no device but did need MinA for balance when initially standing and then turning to sit in the chair at end of test; 11/20/23 10 seconds  3 minute walk test: 667ft no device but needed MinA at times for balance on corners, RPE 4/10; 12/13/23 520ft no device distant S, RPE 3/10     12/13/23 0001   Standardized Balance Assessment   Standardized Balance Assessment Dynamic Gait Index   Dynamic Gait Index   Level Surface 2 2  Change in Gait Speed 2 2  Gait with Horizontal Head Turns 2 2  Gait with Vertical Head Turns 2 2  Gait and Pivot Turn 1 1  Step Over Obstacle 1 1  Step Around Obstacles 1 2  Steps 0 2  Total Score 11 14      GAIT: Distance walked: 639ft Assistive device utilized: None Level of assistance: Min A Comments: MinA for balance with turns and when navigating obstacles, some ataxic movements noted                                                                                                                                 TREATMENT DATE:  01/23/24 Bike L3 x6 min GOALS  ABC 71.9% Gait around the back building "Shortest route" up and down sloped RPE 7 two standing and one seated rest break required, reports of bilateral knee weakness Shoulder Ext 10lb 2x10 Rows 10lb 2x10  01/18/24 NuStep L5x22mins In bars side stepping over beams  [place]one taps in bars On airex reaching for numbers on wall  Side steps on beam  3# marching, hip ext, and hip abd x10  01/16/24 Nustep L5 SLS on 6in box with head swivel 6in step up  x2 each leg   S2S from elevated table  x10 Ball toss on level surface Bars   Tandam walking Stand on airex  Weight shift on airex Lateral stepping on beam SLS taping color cones   01/09/24 Bike L 4 x 1 in stopped due to R knee pain NuStep L 5 x 6 min Step ups 6" min A  Lateral 4in step ups some UE assit x10 Standing on airex weighted ball toss  Sit to stand LE on airex 2x10 elevated surface  Tandem walking in // barx Side steps on airex in // bars  01/04/24 NuStep L5x 7 mins DGI Resisted gait all directions 30lb x4 each seated rest needed during  Sit to stands LE on airex elevated surface 2x10 Alt 4in box taps 2x10  HS curls 35lb 2x10 Leg Ext 10lb 2x10  01/02/24 NuStep L5x28mins Step ups 6" minA  Step ups on airex minA and some use of  handrail Walking on beam needs to hold on in bars Standing arm ext 10# 2x10 AR press 10# 2x10 STS from elevated mat 2x5   12/28/23 Bike L3 x39mins  HS curls 25# 2x12 Leg ext 10# 2x12 Side steps by counter  Standing march requires light UE support  Standing rows and ext green band 2x10 STS 2x10 from elevated mat table  Ball toss 2x10   12/26/23 Bike L 3 x 7 min HS curls 25lb 2x15  Sides steps at [employer] set with standing marches very unstable  Alt 4in box taps  Standing ball toss  On airex  Eyes closed   12/21/23  Hooklying TA sets 15x3 seconds  PPT 15x3 seconds TA set + alternating march from hooklying x20  Reviewed and enforced BLT precautions for his back until MRI can clarify what is happening- he often pushes through pain and encouraged him not to do this to avoid worsening of condition    Nustep L3x8 minutes (dropped resistance due to R knee pain) BLEs only  One foot on BOSU/one foot on solid ground 3x30 seconds (harder for him today)   12/19/23 NuStep L 5 x 7 min seat back for decrease knee flex  Alt 4in box taps 2x10 Side step at mat table  Sit to stand LE on airex elevated surface some UE use 2x10 On airex reaching outside [employer] Ball kicks   Combo w/ toss and kicks Pball roll outs  Heel raises     PATIENT EDUCATION:  Education details: exam findings, POC Person educated: Patient Education method: Explanation, Demonstration, and Verbal cues Education comprehension: verbalized understanding, returned demonstration, and needs further education  HOME EXERCISE PROGRAM:  Access Code: [email or web address] URL: [email or web address] Date: 12/21/2023 Prepared by: Terrel Ferries  Exercises - Wide Tandem Stance with Eyes Open  - 1 x daily - 7 x weekly - 2 sets - 3 reps - 30 seconds  hold - Romberg Stance  - 1 x daily - 7 x weekly - 2 sets - 3 reps - 30 second  hold - Single Leg Stance with Support  - 1 x daily - 7 x weekly - 2 sets - 3 reps - Supine Transversus Abdominis Bracing - Hands on Stomach  - 5 x daily - 7 x weekly - 1 sets - 10 reps - 3 seconds  hold - Supine Posterior Pelvic Tilt  - 2 x daily - 7 x weekly - 1 sets - 10 reps - 3 seconds  hold - Supine March  - 1 x daily - 7 x weekly - 3 sets - 10 reps    ASSESSMENT:  CLINICAL IMPRESSION: Pt enters feeling the same, progress noted needed. Slight improvement with ABC score but pt was overconfident in abilities when scoring himself.  This became evident with outdoor ambulation going up and down slope. Pt was unstable with down hill ambulation with decrease control controlling  gait seed. Instability remained during when waling in parking lot. Pt did reports knee weakness with ambulation requited multiple rest breaks.    EVAL: Patient is a 78 y.o. M who was seen today for physical therapy evaluation and treatment for Free Text Diagnosis R29.6, R26.89, R26.9 Mult. Falls, Poor Balance, Gait Disturbance. Very pleasant and cooperative but very talkative during eval which did limit assessment time. Still having a lot of pain in general since his fall  and ongoing numbness in R UE/LE. Gait mechanics and balance are definitely impaired, would benefit from  training with appropriate AD if he is open to this. Will benefit from skilled PT services to address his concerns and functional objective impairments to the best of our ability.   OBJECTIVE IMPAIRMENTS: Abnormal gait, cardiopulmonary status limiting activity, decreased activity tolerance, decreased balance, decreased mobility, and difficulty walking.   ACTIVITY LIMITATIONS: standing, squatting, stairs, transfers, reach over head, and locomotion level  PARTICIPATION LIMITATIONS: driving, shopping, community activity, and occupation  PERSONAL FACTORS: Age, Behavior pattern, Fitness, Past/current experiences, Social background, and Time since onset of injury/illness/exacerbation are also affecting patient's functional outcome.   REHAB POTENTIAL: Good  CLINICAL DECISION MAKING: Evolving/moderate complexity  EVALUATION COMPLEXITY: Moderate   GOALS: Goals reviewed with patient? No  SHORT TERM GOALS: Target date: 11/23/2023   Will be compliant with appropriate progressive HEP  Baseline: Goal status: MET 11/23/23  2.  Will be able to name 3 ways to prevent falls at home and in the community  Baseline:  Goal status: MET 11/23/23  3.  Will be able to perform 5xSTS and with distant S Baseline: MinA  Goal status: 12/13/23 MET   4.  Will complete TUG test in 12 seconds or less S  Baseline: MinA  Goal status: MET 11/20/23    LONG TERM GOALS: Target date: 02/01/2024      Will score at least 18 on DGI to show improved functional balance  Baseline:  Goal status: ONGOING 12/13/23 DGI 11/24, 01/04/24 progressing  14/30  2.  Will be able to ambulate community distances (level and inclines) no device, RPE no more than 2/10  Baseline:  Goal status: ONGOING 12/13/23 (RPE 6/10), Ongoing 01/23/24 RPE 7/10  3.  Will be able to perform all desired gym activities without increased pain from resting levels  Baseline:  Goal status: ONGOING 12/13/23 improving, improving 01/23/24  4.  ABC score to  improve by at least 20 points to show improved subjective status  Baseline:  Goal status: ONGOING 12/13/23, ONGOING 01/23/24    PLAN:  PT FREQUENCY: 2x/week  PT DURATION: 4 weeks  PLANNED INTERVENTIONS: 97164- PT Re-evaluation, 97110-Therapeutic exercises, 97530- Therapeutic activity, 97112- Neuromuscular re-education, 97535- Self Care, [id number]- Manual therapy, [id number]- Gait training, [phone]- Aquatic Therapy, and Balance training  PLAN FOR NEXT SESSION: balance and coordination progressions with compliant surfaces , functional activity tolerance.   Towanda Fret, PTA 01/23/24 10:56 AM

## 2024-01-25 ENCOUNTER — Ambulatory Visit

## 2024-01-25 DIAGNOSIS — R2681 Unsteadiness on feet: Secondary | ICD-10-CM

## 2024-01-25 DIAGNOSIS — R2689 Other abnormalities of gait and mobility: Secondary | ICD-10-CM | POA: Diagnosis not present

## 2024-01-25 DIAGNOSIS — M6281 Muscle weakness (generalized): Secondary | ICD-10-CM

## 2024-01-25 DIAGNOSIS — R278 Other lack of coordination: Secondary | ICD-10-CM

## 2024-01-25 DIAGNOSIS — D329 Benign neoplasm of meninges, unspecified: Secondary | ICD-10-CM

## 2024-01-25 DIAGNOSIS — R262 Difficulty in walking, not elsewhere classified: Secondary | ICD-10-CM

## 2024-01-25 DIAGNOSIS — R296 Repeated falls: Secondary | ICD-10-CM | POA: Diagnosis not present

## 2024-01-25 NOTE — Therapy (Signed)
 OUTPATIENT PHYSICAL THERAPY LOWER EXTREMITY TREATMENT   Patient Name: Timothy Moran MRN: [id number] DOB:1946-09-11, 78 y.o., male Today's Date: 01/25/2024      END OF SESSION:  PT End of Session - 01/25/24 1313     Visit Number 21    Date for PT Re-Evaluation 02/01/24    PT Start Time 1315    PT Stop Time 1400    PT Time Calculation (min) 45 min    Activity Tolerance Patient tolerated treatment well    Behavior During Therapy [facility] for tasks assessed/performed                       Past Medical History:  Diagnosis Date   Acute blood loss anemia 10/05/2020   Acute respiratory failure (HCC), hypothermia therapy, vent - extubated 10/06/20    AKI (acute kidney injury) (HCC) 10/05/2020   Anoxic brain injury (HCC) 10/26/2020   Arrhythmia    Ascending aortic aneurysm (HCC) 01/29/2018   a. 2019 4.3 cm by echo; b. 02/2021 Echo: Ao root 40mm.   Benign brain tumor (HCC)    Benign neoplasm of brain (HCC) 12/19/2017   Cardiac arrest with ventricular fibrillation (HCC)    Chest pain 12/04/2017   Chronic kidney disease, stage III (moderate) (HCC) 12/19/2017   CKD (chronic kidney disease), stage III - IV (HCC)    Conductive hearing loss of both ears 08/24/2018   Coronary Artery Disease    a. 09/2020 Inf STEMI complicated by cardiac arrest/PCI: LAD 50p/m, LCX 100d (2.5 x 30 Resolute Onyx DES); b. 04/2021 PCI: 04/2021 LM nl, LAD 50p/13m (2.5x30 Onyx Frontier DES), LCX 25p, 86m (RFR 0.98), 40d ISR (RFR 0.98), OM2 25, RCA mild diff dzs.   Diabetes mellitus, type 2 (HCC) 10/06/2020   10/06/20 A1C 7%   Dizziness 10/11/2019   Encounter for imaging study to confirm orogastric (OG) tube placement    Eustachian tube dysfunction 08/24/2018   Hematuria 10/26/2020   HFrEF (heart failure with reduced ejection fraction) (HCC)    a. 11/2020 Echo: EF 25-30%; b. 02/2021 Echo: EF 25-30%, glob HK. Mild LVH. Nl RV fxn. Triv AI. Ao root 40mm.   History of pulmonary embolus (PE)    HLD  (hyperlipidemia) 10/26/2020   Hypertension    Iron deficiency anemia rec'd IV iron 10/26/2020   Ischemic cardiomyopathy    a. 11/2020 Echo: EF 25-30%; b. 02/2021 Echo: EF 25-30%, glob HK.   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 Aaron AasSpirometry 03/08/2018    FEV1 3.86 (108%)  Ratio 96 s prior rx  - PET  03/13/18   Low grade c/w adenoca > rec T surgery eval     STEMI (ST elevation myocardial infarction) (HCC) 10/04/2020   Urinary retention 10/26/2020   Vestibular schwannoma (HCC) 08/14/2020   Past Surgical History:  Procedure Laterality Date   APPENDECTOMY     BIV ICD INSERTION CRT-D N/A 11/03/2021   Procedure: BIV ICD INSERTION CRT-D;  Surgeon: Lei Pump, MD;  Location: [facility] INVASIVE CV LAB;  Service: Cardiovascular;  Laterality: N/A;   CARDIAC CATHETERIZATION     CORONARY ANGIOPLASTY WITH STENT PLACEMENT Left 04/12/2021   LAD stent placement   CORONARY PRESSURE/FFR STUDY N/A 04/12/2021   Procedure: INTRAVASCULAR PRESSURE WIRE/FFR STUDY;  Surgeon: Sammy Crisp, MD;  Location: [facility] INVASIVE CV LAB;  Service: Cardiovascular;  Laterality: N/A;   CORONARY STENT INTERVENTION N/A 04/12/2021   Procedure: CORONARY STENT INTERVENTION;  Surgeon: Sammy Crisp, MD;  Location: [facility] INVASIVE CV LAB;  Service: Cardiovascular;  Laterality: N/A;   CORONARY ULTRASOUND/IVUS N/A  04/12/2021   Procedure: Intravascular Ultrasound/IVUS;  Surgeon: Sammy Crisp, MD;  Location: [facility] INVASIVE CV LAB;  Service: Cardiovascular;  Laterality: N/A;   CORONARY/GRAFT ACUTE MI REVASCULARIZATION N/A 10/04/2020   Procedure: Coronary/Graft Acute MI Revascularization;  Surgeon: Sammy Crisp, MD;  Location: [facility] INVASIVE CV LAB;  Service: Cardiovascular;  Laterality: N/A;   HERNIA REPAIR     LEFT HEART CATH AND CORONARY ANGIOGRAPHY N/A 10/04/2020   Procedure: LEFT HEART CATH AND CORONARY ANGIOGRAPHY;  Surgeon: Sammy Crisp, MD;  Location: [facility] INVASIVE CV LAB;  Service: Cardiovascular;  Laterality: N/A;   RIGHT/LEFT HEART CATH AND CORONARY ANGIOGRAPHY N/A 04/12/2021   Procedure: RIGHT/LEFT HEART CATH AND CORONARY ANGIOGRAPHY;  Surgeon: Sammy Crisp, MD;  Location: [facility] INVASIVE CV LAB;  Service: Cardiovascular;  Laterality: N/A;   Patient Active Problem List   Diagnosis Date Noted   Anemia    Leukocytosis    Right arm weakness 01/25/2022   Right sided weakness    Fall at home 01/22/2022   Status post biventricular pacemaker - MRI compatible PPM and leads 01/22/2022   Chronic indwelling Foley catheter 01/22/2022   CKD (chronic kidney disease), stage III - IV (HCC) 06/28/2021   Chronic HFrEF (heart failure with reduced ejection fraction) (HCC) 03/31/2021   Arrhythmia    DM (diabetes mellitus), type 2 (HCC) new 10/26/2020   Urinary retention 10/26/2020   Hyperlipidemia LDL goal <70 10/26/2020   Coronary artery disease of native artery of native heart with stable angina pectoris (HCC) 10/26/2020   S/P angioplasty with stent 10/04/20 DES to LCX  10/26/2020   Ischemic cardiomyopathy 10/26/2020   Diabetes mellitus, type 2 (HCC) 10/06/2020   DOE (dyspnea on exertion)    Meningioma (HCC) 08/14/2020   Vestibular schwannoma (HCC) 08/14/2020   Cardiac arrest with ventricular fibrillation (HCC)    Pain of left hip joint 05/29/2020   Benign brain tumor (HCC)    Chronic kidney disease     History of pulmonary embolus (PE)    Hypertension    Nonsustained ventricular tachycardia (HCC) 10/11/2019   Dizziness 10/11/2019   Abnormal echocardiogram 06/07/2019   Eustachian tube dysfunction 08/24/2018   Conductive hearing loss of both ears 08/24/2018   Pulmonary hypertension due to thromboembolism (HCC) 03/09/2018   Solitary pulmonary nodule on lung CT 03/08/2018   Ascending aortic aneurysm (HCC) 4.2 cm based on CT from December 2020 01/29/2018   Sinus bradycardia 12/22/2017   Left bundle branch block 12/19/2017   Personal history of pulmonary embolism - no longer on anticoagulation due to pt refusal to take it anymore. 12/19/2017   Chronic kidney disease, stage III (moderate) (HCC) 12/19/2017   Benign neoplasm of brain (HCC) 12/19/2017    PCP: Helyn Lobstein, MD  REFERRING PROVIDER: Helyn Lobstein, MD  REFERRING DIAG: Free Text Diagnosis R29.6, R26.89, R26.9 Mult. Falls, Poor Balance, Gait Disturbance  THERAPY DIAG:  Unsteadiness on feet  Meningioma (HCC)  Muscle weakness (generalized)  Other lack of coordination  Difficulty in walking, not elsewhere classified  Rationale for Evaluation and Treatment: Rehabilitation  ONSET DATE: chronic   SUBJECTIVE:   SUBJECTIVE STATEMENT: After Tuesday session, I fell more unsteady. I have noticed around the house I am having to hold the walk and furniture more. I don't know what is going on.    EVAL: Having issues with balance and walking. I do have a meningioma in my spine, and every once in awhile I have pain if I twist or stress it. Still having pain in my neck and hip from a fall that I had about a year and a half ago. Thought that pain would go away but it hasn't even this far out/being this healed. I have a lot of unsteadiness, not sure I'd call it dizziness but I have to stand for a minute before I get going. Have a couple of walking sticks/canes but not sure how to use them honestly. Entire R LE and UE are  still numb.  When I fell I was at a friend's house and were leaving, about an hour before that I had a glass of wine, I went out first and held the door for the next people but didn't realize that the concrete platform ended and dropped about 15 inches and ended up stepping off that 15 inch ledge and hit my head/shoulder/hip/knee on the wall beyond that. Meningioma is still just being monitored, saw the MD and they said its not growing but its just there in the spine. Every single movement I make with my neck hurts when I get to the end of that range.   PERTINENT HISTORY: See above- complex medical hx  PAIN:  Are you having pain? No 0/10 at rest, twisting and bending 3 0r 4 /10   PRECAUTIONS: Fall and Other: caution with movements, some movements can cause increased pressure/pain at meningioma site, ICD    RED FLAGS: None   WEIGHT BEARING RESTRICTIONS: No  FALLS:  Has patient fallen in last 6 months? Yes. Number of falls 1- had a mis-step coming off a ladder, fell backwards and hit the floor pretty hard  LIVING ENVIRONMENT: Lives with: lives with their spouse Lives in: House/apartment Stairs: 5 STE with B rails, has second floor in his home but holds rail    OCCUPATION: retired   PLOF: Independent, Independent with basic ADLs, Independent with gait, and Independent with transfers  PATIENT GOALS: getting more active, improving balance   NEXT MD VISIT: Referring scheduled but unsure when   OBJECTIVE:  Note: Objective measures were completed at Evaluation unless otherwise noted.    PATIENT SURVEYS:  ABC scale 65.6%; 12/13/23 65%  COGNITION: Overall cognitive status: Within functional limits for tasks assessed     SENSATION: Complete R UE and R LE numbness (chronic)    COORDINATION  Noted some ataxia in R LE with functional movements   LOWER EXTREMITY MMT:  MMT Right eval Left eval  Hip flexion 4+ 5  Hip extension    Hip abduction 5 5  Hip adduction    Hip internal rotation     Hip external rotation    Knee flexion 5 5  Knee extension 5 5  Ankle dorsiflexion 5 5  Ankle plantarflexion    Ankle inversion    Ankle eversion     (  Blank rows = not tested)   FUNCTIONAL TESTS:  5 times sit to stand: 15.4 seconds use of UEs, some difficulty stabilizing; 11/20/23 15.2 seconds use of UEs  Timed up and go (TUG): 14.2 seconds no device but did need MinA for balance when initially standing and then turning to sit in the chair at end of test; 11/20/23 10 seconds  3 minute walk test: 636ft no device but needed MinA at times for balance on corners, RPE 4/10; 12/13/23 577ft no device distant S, RPE 3/10     12/13/23 0001   Standardized Balance Assessment   Standardized Balance Assessment Dynamic Gait Index   Dynamic Gait Index   Level Surface 2 2  Change in Gait Speed 2 2  Gait with Horizontal Head Turns 2 2  Gait with Vertical Head Turns 2 2  Gait and Pivot Turn 1 1  Step Over Obstacle 1 1  Step Around Obstacles 1 2  Steps 0 2  Total Score 11 14      GAIT: Distance walked: 672ft Assistive device utilized: None Level of assistance: Min A Comments: MinA for balance with turns and when navigating obstacles, some ataxic movements noted                                                                                                                                 TREATMENT DATE:  01/25/24 NuStep L5 x43mins  Shoulder ext 10# 2x10 Standing half tandem and full tandem in bars Tandem walking in bars  Leg ext 10# 2x12 HS curls 35# 2x12   01/23/24 Bike L3 x6 min GOALS  ABC 71.9% Gait around the back building "Shortest route" up and down sloped RPE 7 two standing and one seated rest break required, reports of bilateral knee weakness Shoulder Ext 10lb 2x10 Rows 10lb 2x10  01/18/24 NuStep L5x59mins In bars side stepping over beams  [place] taps in bars On airex reaching for numbers on wall  Side steps on beam  3# marching, hip ext, and hip abd  x10  01/16/24 Nustep L5 SLS on 6in box with head swivel 6in step up  x2 each leg   S2S from elevated table x10 Ball toss on level surface Bars   Tandam walking Stand on airex  Weight shift on airex Lateral stepping on beam SLS taping color cones   01/09/24 Bike L 4 x 1 in stopped due to R knee pain NuStep L 5 x 6 min Step ups 6" min A  Lateral 4in step ups some UE assit x10 Standing on airex weighted ball toss  Sit to stand LE on airex 2x10 elevated surface  Tandem walking in // barx Side steps on airex in // bars  01/04/24 NuStep L5x 7 mins DGI Resisted gait all directions 30lb x4 each seated rest needed during  Sit to stands LE on airex elevated surface 2x10 Alt 4in box taps 2x10  HS curls 35lb 2x10 Leg  Ext 10lb 2x10  01/02/24 NuStep L5x93mins Step ups 6" minA  Step ups on airex minA and some use of handrail Walking on beam needs to hold on in bars Standing arm ext 10# 2x10 AR press 10# 2x10 STS from elevated mat 2x5   12/28/23 Bike L3 x21mins  HS curls 25# 2x12 Leg ext 10# 2x12 Side steps by counter  Standing march requires light UE support  Standing rows and ext green band 2x10 STS 2x10 from elevated mat table  Ball toss 2x10   12/26/23 Bike L 3 x 7 min HS curls 25lb 2x15  Sides steps at [employer] set with standing marches very unstable  Alt 4in box taps  Standing ball toss  On airex  Eyes closed   12/21/23  Hooklying TA sets 15x3 seconds  PPT 15x3 seconds TA set + alternating march from hooklying x20  Reviewed and enforced BLT precautions for his back until MRI can clarify what is happening- he often pushes through pain and encouraged him not to do this to avoid worsening of condition    Nustep L3x8 minutes (dropped resistance due to R knee pain) BLEs only  One foot on BOSU/one foot on solid ground 3x30 seconds (harder for him today)   12/19/23 NuStep L 5 x 7 min seat back for decrease knee flex  Alt 4in box taps 2x10 Side step at  mat table  Sit to stand LE on airex elevated surface some UE use 2x10 On airex reaching outside [employer]. Ball kicks   Combo w/ toss and kicks Pball roll outs  Heel raises     PATIENT EDUCATION:  Education details: exam findings, POC Person educated: Patient Education method: Explanation, Demonstration, and Verbal cues Education comprehension: verbalized understanding, returned demonstration, and needs further education  HOME EXERCISE PROGRAM:  Access Code: [email or web address] URL: [email or web address] Date: 12/21/2023 Prepared by: Terrel Ferries  Exercises - Wide Tandem Stance with Eyes Open  - 1 x daily - 7 x weekly - 2 sets - 3 reps - 30 seconds  hold - Romberg Stance  - 1 x daily - 7 x weekly - 2 sets - 3 reps - 30 second  hold - Single Leg Stance with Support  - 1 x daily - 7 x weekly - 2 sets - 3 reps - Supine Transversus Abdominis Bracing - Hands on Stomach  - 5 x daily - 7 x weekly - 1 sets - 10 reps - 3 seconds  hold - Supine Posterior Pelvic Tilt  - 2 x daily - 7 x weekly - 1 sets - 10 reps - 3 seconds  hold - Supine March  - 1 x daily - 7 x weekly - 3 sets - 10 reps    ASSESSMENT:  CLINICAL IMPRESSION: Pt enters feeling that he is more wobbly and unsteady. He is not sure why this may be and thinks it may be a good idea to get follow up imaging once he is done with PT. We focused on LE strength and some balance in the bars today. He is very unsteady and has difficulty with half tandem and tandem stance holds. He is unable to do tandem walking even holding on to the bars.    EVAL: Patient is a 78 y.o. M who was seen today for physical therapy evaluation and treatment for Free Text Diagnosis R29.6, R26.89, R26.9 Mult. Falls, Poor Balance, Gait Disturbance. Very pleasant and cooperative but very talkative during eval which did limit assessment  time. Still having a lot of pain in general since his fall  and ongoing numbness in R UE/LE. Gait mechanics and  balance are definitely impaired, would benefit from training with appropriate AD if he is open to this. Will benefit from skilled PT services to address his concerns and functional objective impairments to the best of our ability.   OBJECTIVE IMPAIRMENTS: Abnormal gait, cardiopulmonary status limiting activity, decreased activity tolerance, decreased balance, decreased mobility, and difficulty walking.   ACTIVITY LIMITATIONS: standing, squatting, stairs, transfers, reach over head, and locomotion level  PARTICIPATION LIMITATIONS: driving, shopping, community activity, and occupation  PERSONAL FACTORS: Age, Behavior pattern, Fitness, Past/current experiences, Social background, and Time since onset of injury/illness/exacerbation are also affecting patient's functional outcome.   REHAB POTENTIAL: Good  CLINICAL DECISION MAKING: Evolving/moderate complexity  EVALUATION COMPLEXITY: Moderate   GOALS: Goals reviewed with patient? No  SHORT TERM GOALS: Target date: 11/23/2023   Will be compliant with appropriate progressive HEP  Baseline: Goal status: MET 11/23/23  2.  Will be able to name 3 ways to prevent falls at home and in the community  Baseline:  Goal status: MET 11/23/23  3.  Will be able to perform 5xSTS and with distant S Baseline: MinA  Goal status: 12/13/23 MET   4.  Will complete TUG test in 12 seconds or less S  Baseline: MinA  Goal status: MET 11/20/23    LONG TERM GOALS: Target date: 02/01/2024      Will score at least 18 on DGI to show improved functional balance  Baseline:  Goal status: ONGOING 12/13/23 DGI 11/24, 01/04/24 progressing  14/30  2.  Will be able to ambulate community distances (level and inclines) no device, RPE no more than 2/10  Baseline:  Goal status: ONGOING 12/13/23 (RPE 6/10), Ongoing 01/23/24 RPE 7/10  3.  Will be able to perform all desired gym activities without increased pain from resting levels  Baseline:  Goal status: ONGOING  12/13/23 improving, improving 01/23/24  4.  ABC score to improve by at least 20 points to show improved subjective status  Baseline:  Goal status: ONGOING 12/13/23, ONGOING 01/23/24    PLAN:  PT FREQUENCY: 2x/week  PT DURATION: 4 weeks  PLANNED INTERVENTIONS: [id number]- PT Re-evaluation, 97110-Therapeutic exercises, 97530- Therapeutic activity, 97112- Neuromuscular re-education, 97535- Self Care, [id number]- Manual therapy, [id number]- Gait training, [phone]- Aquatic Therapy, and Balance training  PLAN FOR NEXT SESSION: balance and coordination progressions with compliant surfaces , functional activity tolerance.   Towanda Fret, PTA 01/25/24 1:55 PM

## 2024-01-30 ENCOUNTER — Ambulatory Visit: Admitting: Physical Therapy

## 2024-01-30 ENCOUNTER — Encounter: Payer: Self-pay | Admitting: Physical Therapy

## 2024-01-30 DIAGNOSIS — R296 Repeated falls: Secondary | ICD-10-CM

## 2024-01-30 DIAGNOSIS — R2689 Other abnormalities of gait and mobility: Secondary | ICD-10-CM | POA: Diagnosis not present

## 2024-01-30 DIAGNOSIS — M6281 Muscle weakness (generalized): Secondary | ICD-10-CM

## 2024-01-30 DIAGNOSIS — D329 Benign neoplasm of meninges, unspecified: Secondary | ICD-10-CM

## 2024-01-30 DIAGNOSIS — R2681 Unsteadiness on feet: Secondary | ICD-10-CM

## 2024-01-30 DIAGNOSIS — R262 Difficulty in walking, not elsewhere classified: Secondary | ICD-10-CM

## 2024-01-30 NOTE — Therapy (Signed)
 OUTPATIENT PHYSICAL THERAPY LOWER EXTREMITY TREATMENT   Patient Name: Timothy Moran MRN: [id number] DOB:[birth year], 78 y.o., male Today's Date: 01/30/2024      END OF SESSION:  PT End of Session - 01/30/24 1056     Visit Number 22    Date for PT Re-Evaluation 02/01/24    PT Start Time 1057    PT Stop Time 1140    PT Time Calculation (min) 43 min    Activity Tolerance Patient tolerated treatment well    Behavior During Therapy [facility] for tasks assessed/performed                       Past Medical History:  Diagnosis Date   Acute blood loss anemia 10/05/2020   Acute respiratory failure (HCC), hypothermia therapy, vent - extubated 10/06/20    AKI (acute kidney injury) (HCC) 10/05/2020   Anoxic brain injury (HCC) 10/26/2020   Arrhythmia    Ascending aortic aneurysm (HCC) 01/29/2018   a. 2019 4.3 cm by echo; b. 02/2021 Echo: Ao root 40mm.   Benign brain tumor (HCC)    Benign neoplasm of brain (HCC) 12/19/2017   Cardiac arrest with ventricular fibrillation (HCC)    Chest pain 12/04/2017   Chronic kidney disease, stage III (moderate) (HCC) 12/19/2017   CKD (chronic kidney disease), stage III - IV (HCC)    Conductive hearing loss of both ears 08/24/2018   Coronary Artery Disease    a. 09/2020 Inf STEMI complicated by cardiac arrest/PCI: LAD 50p/m, LCX 100d (2.5 x 30 Resolute Onyx DES); b. 04/2021 PCI: 04/2021 LM nl, LAD 50p/66m (2.5x30 Onyx Frontier DES), LCX 25p, 109m (RFR 0.98), 40d ISR (RFR 0.98), OM2 25, RCA mild diff dzs.   Diabetes mellitus, type 2 (HCC) 10/06/2020   10/06/20 A1C 7%   Dizziness 10/11/2019   Encounter for imaging study to confirm orogastric (OG) tube placement    Eustachian tube dysfunction 08/24/2018   Hematuria 10/26/2020   HFrEF (heart failure with reduced ejection fraction) (HCC)    a. 11/2020 Echo: EF 25-30%; b. 02/2021 Echo: EF 25-30%, glob HK. Mild LVH. Nl RV fxn. Triv AI. Ao root 40mm.   History of pulmonary embolus (PE)    HLD  (hyperlipidemia) 10/26/2020   Hypertension    Iron deficiency anemia rec'd IV iron 10/26/2020   Ischemic cardiomyopathy    a. 11/2020 Echo: EF 25-30%; b. 02/2021 Echo: EF 25-30%, glob HK.   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 Aaron AasSpirometry 03/08/2018    FEV1 3.86 (108%)  Ratio 96 s prior rx  - PET  03/13/18   Low grade c/w adenoca > rec T surgery eval     STEMI (ST elevation myocardial infarction) (HCC) 10/04/2020   Urinary retention 10/26/2020   Vestibular schwannoma (HCC) 08/14/2020   Past Surgical History:  Procedure Laterality Date   APPENDECTOMY     BIV ICD INSERTION CRT-D N/A 11/03/2021   Procedure: BIV ICD INSERTION CRT-D;  Surgeon: Lei Pump, MD;  Location: [facility] INVASIVE CV LAB;  Service: Cardiovascular;  Laterality: N/A;   CARDIAC CATHETERIZATION     CORONARY ANGIOPLASTY WITH STENT PLACEMENT Left 04/12/2021   LAD stent placement   CORONARY PRESSURE/FFR STUDY N/A 04/12/2021   Procedure: INTRAVASCULAR PRESSURE WIRE/FFR STUDY;  Surgeon: Sammy Crisp, MD;  Location: [facility] INVASIVE CV LAB;  Service: Cardiovascular;  Laterality: N/A;   CORONARY STENT INTERVENTION N/A 04/12/2021   Procedure: CORONARY STENT INTERVENTION;  Surgeon: Sammy Crisp, MD;  Location: [facility] INVASIVE CV LAB;  Service: Cardiovascular;  Laterality: N/A;   CORONARY ULTRASOUND/IVUS N/A  04/12/2021   Procedure: Intravascular Ultrasound/IVUS;  Surgeon: Sammy Crisp, MD;  Location: [facility] INVASIVE CV LAB;  Service: Cardiovascular;  Laterality: N/A;   CORONARY/GRAFT ACUTE MI REVASCULARIZATION N/A 10/04/2020   Procedure: Coronary/Graft Acute MI Revascularization;  Surgeon: Sammy Crisp, MD;  Location: [facility] INVASIVE CV LAB;  Service: Cardiovascular;  Laterality: N/A;   HERNIA REPAIR     LEFT HEART CATH AND CORONARY ANGIOGRAPHY N/A 10/04/2020   Procedure: LEFT HEART CATH AND CORONARY ANGIOGRAPHY;  Surgeon: Sammy Crisp, MD;  Location: [facility] INVASIVE CV LAB;  Service: Cardiovascular;  Laterality: N/A;   RIGHT/LEFT HEART CATH AND CORONARY ANGIOGRAPHY N/A 04/12/2021   Procedure: RIGHT/LEFT HEART CATH AND CORONARY ANGIOGRAPHY;  Surgeon: Sammy Crisp, MD;  Location: [facility] INVASIVE CV LAB;  Service: Cardiovascular;  Laterality: N/A;   Patient Active Problem List   Diagnosis Date Noted   Anemia    Leukocytosis    Right arm weakness 01/25/2022   Right sided weakness    Fall at home 01/22/2022   Status post biventricular pacemaker - MRI compatible PPM and leads 01/22/2022   Chronic indwelling Foley catheter 01/22/2022   CKD (chronic kidney disease), stage III - IV (HCC) 06/28/2021   Chronic HFrEF (heart failure with reduced ejection fraction) (HCC) 03/31/2021   Arrhythmia    DM (diabetes mellitus), type 2 (HCC) new 10/26/2020   Urinary retention 10/26/2020   Hyperlipidemia LDL goal <70 10/26/2020   Coronary artery disease of native artery of native heart with stable angina pectoris (HCC) 10/26/2020   S/P angioplasty with stent 10/04/20 DES to LCX  10/26/2020   Ischemic cardiomyopathy 10/26/2020   Diabetes mellitus, type 2 (HCC) 10/06/2020   DOE (dyspnea on exertion)    Meningioma (HCC) 08/14/2020   Vestibular schwannoma (HCC) 08/14/2020   Cardiac arrest with ventricular fibrillation (HCC)    Pain of left hip joint 05/29/2020   Benign brain tumor (HCC)    Chronic kidney disease     History of pulmonary embolus (PE)    Hypertension    Nonsustained ventricular tachycardia (HCC) 10/11/2019   Dizziness 10/11/2019   Abnormal echocardiogram 06/07/2019   Eustachian tube dysfunction 08/24/2018   Conductive hearing loss of both ears 08/24/2018   Pulmonary hypertension due to thromboembolism (HCC) 03/09/2018   Solitary pulmonary nodule on lung CT 03/08/2018   Ascending aortic aneurysm (HCC) 4.2 cm based on CT from December 2020 01/29/2018   Sinus bradycardia 12/22/2017   Left bundle branch block 12/19/2017   Personal history of pulmonary embolism - no longer on anticoagulation due to pt refusal to take it anymore. 12/19/2017   Chronic kidney disease, stage III (moderate) (HCC) 12/19/2017   Benign neoplasm of brain (HCC) 12/19/2017    PCP: Helyn Lobstein, MD  REFERRING PROVIDER: Helyn Lobstein, MD  REFERRING DIAG: Free Text Diagnosis R29.6, R26.89, R26.9 Mult. Falls, Poor Balance, Gait Disturbance  THERAPY DIAG:  Unsteadiness on feet  Meningioma (HCC)  Difficulty in walking, not elsewhere classified  Repeated falls  Muscle weakness (generalized)  Rationale for Evaluation and Treatment: Rehabilitation  ONSET DATE: chronic   SUBJECTIVE:   SUBJECTIVE STATEMENT: About the same, dizzy and unsteady     EVAL: Having issues with balance and walking. I do have a meningioma in my spine, and every once in awhile I have pain if I twist or stress it. Still having pain in my neck and hip from a fall that I had about a year and a half ago. Thought that pain would go away but it hasn't even this far out/being this healed. I have a lot of unsteadiness, not sure I'd call it dizziness but I have to stand for a minute before I get going. Have a couple of walking sticks/canes but not sure how to use them honestly. Entire R LE and UE are  still numb. When I fell I was at a friend's house and were leaving, about an hour before that I had a glass of wine, I went out first and held the  door for the next people but didn't realize that the concrete platform ended and dropped about 15 inches and ended up stepping off that 15 inch ledge and hit my head/shoulder/hip/knee on the wall beyond that. Meningioma is still just being monitored, saw the MD and they said its not growing but its just there in the spine. Every single movement I make with my neck hurts when I get to the end of that range.   PERTINENT HISTORY: See above- complex medical hx  PAIN:  Are you having pain? No 0/10 at rest, twisting and bending 3 0r 4 /10   PRECAUTIONS: Fall and Other: caution with movements, some movements can cause increased pressure/pain at meningioma site, ICD    RED FLAGS: None   WEIGHT BEARING RESTRICTIONS: No  FALLS:  Has patient fallen in last 6 months? Yes. Number of falls 1- had a mis-step coming off a ladder, fell backwards and hit the floor pretty hard  LIVING ENVIRONMENT: Lives with: lives with their spouse Lives in: House/apartment Stairs: 5 STE with B rails, has second floor in his home but holds rail    OCCUPATION: retired   PLOF: Independent, Independent with basic ADLs, Independent with gait, and Independent with transfers  PATIENT GOALS: getting more active, improving balance   NEXT MD VISIT: Referring scheduled but unsure when   OBJECTIVE:  Note: Objective measures were completed at Evaluation unless otherwise noted.    PATIENT SURVEYS:  ABC scale 65.6%; 12/13/23 65%  COGNITION: Overall cognitive status: Within functional limits for tasks assessed     SENSATION: Complete R UE and R LE numbness (chronic)    COORDINATION  Noted some ataxia in R LE with functional movements   LOWER EXTREMITY MMT:  MMT Right eval Left eval  Hip flexion 4+ 5  Hip extension    Hip abduction 5 5  Hip adduction    Hip internal rotation    Hip external rotation    Knee flexion 5 5  Knee extension 5 5  Ankle dorsiflexion 5 5  Ankle plantarflexion    Ankle  inversion    Ankle eversion     (Blank rows = not tested)   FUNCTIONAL TESTS:  5 times sit to stand: 15.4 seconds use of UEs, some difficulty stabilizing;  11/20/23 15.2 seconds use of UEs  Timed up and go (TUG): 14.2 seconds no device but did need MinA for balance when initially standing and then turning to sit in the chair at end of test; 11/20/23 10 seconds  3 minute walk test: 629ft no device but needed MinA at times for balance on corners, RPE 4/10; 12/13/23 560ft no device distant S, RPE 3/10     12/13/23 0001   Standardized Balance Assessment   Standardized Balance Assessment Dynamic Gait Index   Dynamic Gait Index   Level Surface 2 2  Change in Gait Speed 2 2  Gait with Horizontal Head Turns 2 2  Gait with Vertical Head Turns 2 2  Gait and Pivot Turn 1 1  Step Over Obstacle 1 1  Step Around Obstacles 1 2  Steps 0 2  Total Score 11 14      GAIT: Distance walked: 680ft Assistive device utilized: None Level of assistance: Min A Comments: MinA for balance with turns and when navigating obstacles, some ataxic movements noted                                                                                                                                 TREATMENT DATE:  01/30/24 NuStep L5 x6 mins  Gait outside down hill and back up on L side fo back parking lot. Increase fatigue and instability at times, 3 standing and one seated rest needed. Pt reported that his knees feel numb Seated Rows & Lats 25lb 2x10 Shoulder Ext 10lb 2x10 4in step up x10 each some UE assit at times  20lb resisted side steps    01/25/24 NuStep L5 x23mins  Shoulder ext 10# 2x10 Standing half tandem and full tandem in bars Tandem walking in bars  Leg ext 10# 2x12 HS curls 35# 2x12   01/23/24 Bike L3 x6 min GOALS  ABC 71.9% Gait around the back building "Shortest route" up and down sloped RPE 7 two standing and one seated rest break required, reports of bilateral knee weakness Shoulder Ext  10lb 2x10 Rows 10lb 2x10  01/18/24 NuStep L5x15mins In bars side stepping over beams  [place] taps in bars On airex reaching for numbers on wall  Side steps on beam  3# marching, hip ext, and hip abd x10  01/16/24 Nustep L5 SLS on 6in box with head swivel 6in step up  x2 each leg   S2S from elevated table x10 Ball toss on level surface Bars   Tandam walking Stand on airex  Weight shift on airex Lateral stepping on beam SLS taping color cones   01/09/24 Bike L 4 x 1 in stopped due to R knee pain NuStep L 5 x 6 min Step ups 6" min A  Lateral 4in step ups some UE assit x10 Standing on airex weighted ball toss  Sit to stand LE on airex 2x10 elevated surface  Tandem walking in // barx Side  steps on airex in // bars  01/04/24 NuStep L5x 7 mins DGI Resisted gait all directions 30lb x4 each seated rest needed during  Sit to stands LE on airex elevated surface 2x10 Alt 4in box taps 2x10  HS curls 35lb 2x10 Leg Ext 10lb 2x10  01/02/24 NuStep L5x40mins Step ups 6" minA  Step ups on airex minA and some use of handrail Walking on beam needs to hold on in bars Standing arm ext 10# 2x10 AR press 10# 2x10 STS from elevated mat 2x5   12/28/23 Bike L3 x69mins  HS curls 25# 2x12 Leg ext 10# 2x12 Side steps by counter  Standing march requires light UE support  Standing rows and ext green band 2x10 STS 2x10 from elevated mat table  Ball toss 2x10   12/26/23 Bike L 3 x 7 min HS curls 25lb 2x15  Sides steps at [employer] set with standing marches very unstable  Alt 4in box taps  Standing ball toss  On airex  Eyes closed   12/21/23  Hooklying TA sets 15x3 seconds  PPT 15x3 seconds TA set + alternating march from hooklying x20  Reviewed and enforced BLT precautions for his back until MRI can clarify what is happening- he often pushes through pain and encouraged him not to do this to avoid worsening of condition    Nustep L3x8 minutes (dropped resistance due to R  knee pain) BLEs only  One foot on BOSU/one foot on solid ground 3x30 seconds (harder for him today)    PATIENT EDUCATION:  Education details: exam findings, POC Person educated: Patient Education method: Explanation, Demonstration, and Verbal cues Education comprehension: verbalized understanding, returned demonstration, and needs further education  HOME EXERCISE PROGRAM:  Access Code: [email or web address] URL: [email or web address] Date: 12/21/2023 Prepared by: Terrel Ferries  Exercises - Wide Tandem Stance with Eyes Open  - 1 x daily - 7 x weekly - 2 sets - 3 reps - 30 seconds  hold - Romberg Stance  - 1 x daily - 7 x weekly - 2 sets - 3 reps - 30 second  hold - Single Leg Stance with Support  - 1 x daily - 7 x weekly - 2 sets - 3 reps - Supine Transversus Abdominis Bracing - Hands on Stomach  - 5 x daily - 7 x weekly - 1 sets - 10 reps - 3 seconds  hold - Supine Posterior Pelvic Tilt  - 2 x daily - 7 x weekly - 1 sets - 10 reps - 3 seconds  hold - Supine March  - 1 x daily - 7 x weekly - 3 sets - 10 reps    ASSESSMENT:  CLINICAL IMPRESSION: Again pt enters feeling that he is more wobbly and unsteady and unsure why. He reports having an upcoming follow appointment with MD this week.Focus on gait and postural strength. Cue to control gait speed with downhill ambulation. He did reports some knee numbness about half way during outdoor ambulation ~ 578ft.  He is very unsteady overall but able to catch himself. Reports no falls.   EVAL: Patient is a 78 y.o. M who was seen today for physical therapy evaluation and treatment for Free Text Diagnosis R29.6, R26.89, R26.9 Mult. Falls, Poor Balance, Gait Disturbance. Very pleasant and cooperative but very talkative during eval which did limit assessment time. Still having a lot of pain in general since his fall  and ongoing numbness in R UE/LE. Gait mechanics and balance are definitely impaired, would benefit from  training with appropriate  AD if he is open to this. Will benefit from skilled PT services to address his concerns and functional objective impairments to the best of our ability.   OBJECTIVE IMPAIRMENTS: Abnormal gait, cardiopulmonary status limiting activity, decreased activity tolerance, decreased balance, decreased mobility, and difficulty walking.   ACTIVITY LIMITATIONS: standing, squatting, stairs, transfers, reach over head, and locomotion level  PARTICIPATION LIMITATIONS: driving, shopping, community activity, and occupation  PERSONAL FACTORS: Age, Behavior pattern, Fitness, Past/current experiences, Social background, and Time since onset of injury/illness/exacerbation are also affecting patient's functional outcome.   REHAB POTENTIAL: Good  CLINICAL DECISION MAKING: Evolving/moderate complexity  EVALUATION COMPLEXITY: Moderate   GOALS: Goals reviewed with patient? No  SHORT TERM GOALS: Target date: 11/23/2023   Will be compliant with appropriate progressive HEP  Baseline: Goal status: MET 11/23/23  2.  Will be able to name 3 ways to prevent falls at home and in the community  Baseline:  Goal status: MET 11/23/23  3.  Will be able to perform 5xSTS and with distant S Baseline: MinA  Goal status: 12/13/23 MET   4.  Will complete TUG test in 12 seconds or less S  Baseline: MinA  Goal status: MET 11/20/23    LONG TERM GOALS: Target date: 02/01/2024      Will score at least 18 on DGI to show improved functional balance  Baseline:  Goal status: ONGOING 12/13/23 DGI 11/24, 01/04/24 progressing  14/30  2.  Will be able to ambulate community distances (level and inclines) no device, RPE no more than 2/10  Baseline:  Goal status: ONGOING 12/13/23 (RPE 6/10), Ongoing 01/23/24 RPE 7/10  3.  Will be able to perform all desired gym activities without increased pain from resting levels  Baseline:  Goal status: ONGOING 12/13/23 improving, improving 01/23/24  4.  ABC score to improve by at least 20  points to show improved subjective status  Baseline:  Goal status: ONGOING 12/13/23, ONGOING 01/23/24    PLAN:  PT FREQUENCY: 2x/week  PT DURATION: 4 weeks  PLANNED INTERVENTIONS: [id number]- PT Re-evaluation, 97110-Therapeutic exercises, 97530- Therapeutic activity, 97112- Neuromuscular re-education, 97535- Self Care, [id number]- Manual therapy, [id number]- Gait training, [phone]- Aquatic Therapy, and Balance training  PLAN FOR NEXT SESSION: balance and coordination progressions with compliant surfaces , functional activity tolerance.   Towanda Fret, PTA 01/30/24 10:56 AM

## 2024-02-01 ENCOUNTER — Encounter: Payer: Self-pay | Admitting: Physical Therapy

## 2024-02-01 ENCOUNTER — Ambulatory Visit: Admitting: Physical Therapy

## 2024-02-01 DIAGNOSIS — D329 Benign neoplasm of meninges, unspecified: Secondary | ICD-10-CM

## 2024-02-01 DIAGNOSIS — R262 Difficulty in walking, not elsewhere classified: Secondary | ICD-10-CM

## 2024-02-01 DIAGNOSIS — R2681 Unsteadiness on feet: Secondary | ICD-10-CM

## 2024-02-01 DIAGNOSIS — R296 Repeated falls: Secondary | ICD-10-CM

## 2024-02-01 DIAGNOSIS — R2689 Other abnormalities of gait and mobility: Secondary | ICD-10-CM | POA: Diagnosis not present

## 2024-02-01 NOTE — Therapy (Signed)
 OUTPATIENT PHYSICAL THERAPY LOWER EXTREMITY TREATMENT   Patient Name: Timothy Moran MRN: [id number] DOB:August 24, 1946, 78 y.o., male Today's Date: 02/01/2024      END OF SESSION:  PT End of Session - 02/01/24 1101     Visit Number 23    Date for PT Re-Evaluation 02/01/24    PT Start Time 1100    PT Stop Time 1145    PT Time Calculation (min) 45 min    Activity Tolerance Patient tolerated treatment well    Behavior During Therapy [facility] for tasks assessed/performed                       Past Medical History:  Diagnosis Date   Acute blood loss anemia 10/05/2020   Acute respiratory failure (HCC), hypothermia therapy, vent - extubated 10/06/20    AKI (acute kidney injury) (HCC) 10/05/2020   Anoxic brain injury (HCC) 10/26/2020   Arrhythmia    Ascending aortic aneurysm (HCC) 01/29/2018   a. 2019 4.3 cm by echo; b. 02/2021 Echo: Ao root 40mm.   Benign brain tumor (HCC)    Benign neoplasm of brain (HCC) 12/19/2017   Cardiac arrest with ventricular fibrillation (HCC)    Chest pain 12/04/2017   Chronic kidney disease, stage III (moderate) (HCC) 12/19/2017   CKD (chronic kidney disease), stage III - IV (HCC)    Conductive hearing loss of both ears 08/24/2018   Coronary Artery Disease    a. 09/2020 Inf STEMI complicated by cardiac arrest/PCI: LAD 50p/m, LCX 100d (2.5 x 30 Resolute Onyx DES); b. 04/2021 PCI: 04/2021 LM nl, LAD 50p/40m (2.5x30 Onyx Frontier DES), LCX 25p, 74m (RFR 0.98), 40d ISR (RFR 0.98), OM2 25, RCA mild diff dzs.   Diabetes mellitus, type 2 (HCC) 10/06/2020   10/06/20 A1C 7%   Dizziness 10/11/2019   Encounter for imaging study to confirm orogastric (OG) tube placement    Eustachian tube dysfunction 08/24/2018   Hematuria 10/26/2020   HFrEF (heart failure with reduced ejection fraction) (HCC)    a. 11/2020 Echo: EF 25-30%; b. 02/2021 Echo: EF 25-30%, glob HK. Mild LVH. Nl RV fxn. Triv AI. Ao root 40mm.   History of pulmonary embolus (PE)    HLD  (hyperlipidemia) 10/26/2020   Hypertension    Iron deficiency anemia rec'd IV iron 10/26/2020   Ischemic cardiomyopathy    a. 11/2020 Echo: EF 25-30%; b. 02/2021 Echo: EF 25-30%, glob HK.   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 Aaron AasSpirometry 03/08/2018    FEV1 3.86 (108%)  Ratio 96 s prior rx  - PET  03/13/18   Low grade c/w adenoca > rec T surgery eval     STEMI (ST elevation myocardial infarction) (HCC) 10/04/2020   Urinary retention 10/26/2020   Vestibular schwannoma (HCC) 08/14/2020   Past Surgical History:  Procedure Laterality Date   APPENDECTOMY     BIV ICD INSERTION CRT-D N/A 11/03/2021   Procedure: BIV ICD INSERTION CRT-D;  Surgeon: Lei Pump, MD;  Location: [facility] INVASIVE CV LAB;  Service: Cardiovascular;  Laterality: N/A;   CARDIAC CATHETERIZATION     CORONARY ANGIOPLASTY WITH STENT PLACEMENT Left 04/12/2021   LAD stent placement   CORONARY PRESSURE/FFR STUDY N/A 04/12/2021   Procedure: INTRAVASCULAR PRESSURE WIRE/FFR STUDY;  Surgeon: Sammy Crisp, MD;  Location: [facility] INVASIVE CV LAB;  Service: Cardiovascular;  Laterality: N/A;   CORONARY STENT INTERVENTION N/A 04/12/2021   Procedure: CORONARY STENT INTERVENTION;  Surgeon: Sammy Crisp, MD;  Location: [facility] INVASIVE CV LAB;  Service: Cardiovascular;  Laterality: N/A;   CORONARY ULTRASOUND/IVUS N/A  04/12/2021   Procedure: Intravascular Ultrasound/IVUS;  Surgeon: Sammy Crisp, MD;  Location: [facility] INVASIVE CV LAB;  Service: Cardiovascular;  Laterality: N/A;   CORONARY/GRAFT ACUTE MI REVASCULARIZATION N/A 10/04/2020   Procedure: Coronary/Graft Acute MI Revascularization;  Surgeon: Sammy Crisp, MD;  Location: [facility] INVASIVE CV LAB;  Service: Cardiovascular;  Laterality: N/A;   HERNIA REPAIR     LEFT HEART CATH AND CORONARY ANGIOGRAPHY N/A 10/04/2020   Procedure: LEFT HEART CATH AND CORONARY ANGIOGRAPHY;  Surgeon: Sammy Crisp, MD;  Location: [facility] INVASIVE CV LAB;  Service: Cardiovascular;  Laterality: N/A;   RIGHT/LEFT HEART CATH AND CORONARY ANGIOGRAPHY N/A 04/12/2021   Procedure: RIGHT/LEFT HEART CATH AND CORONARY ANGIOGRAPHY;  Surgeon: Sammy Crisp, MD;  Location: [facility] INVASIVE CV LAB;  Service: Cardiovascular;  Laterality: N/A;   Patient Active Problem List   Diagnosis Date Noted   Anemia    Leukocytosis    Right arm weakness 01/25/2022   Right sided weakness    Fall at home 01/22/2022   Status post biventricular pacemaker - MRI compatible PPM and leads 01/22/2022   Chronic indwelling Foley catheter 01/22/2022   CKD (chronic kidney disease), stage III - IV (HCC) 06/28/2021   Chronic HFrEF (heart failure with reduced ejection fraction) (HCC) 03/31/2021   Arrhythmia    DM (diabetes mellitus), type 2 (HCC) new 10/26/2020   Urinary retention 10/26/2020   Hyperlipidemia LDL goal <70 10/26/2020   Coronary artery disease of native artery of native heart with stable angina pectoris (HCC) 10/26/2020   S/P angioplasty with stent 10/04/20 DES to LCX  10/26/2020   Ischemic cardiomyopathy 10/26/2020   Diabetes mellitus, type 2 (HCC) 10/06/2020   DOE (dyspnea on exertion)    Meningioma (HCC) 08/14/2020   Vestibular schwannoma (HCC) 08/14/2020   Cardiac arrest with ventricular fibrillation (HCC)    Pain of left hip joint 05/29/2020   Benign brain tumor (HCC)    Chronic kidney disease     History of pulmonary embolus (PE)    Hypertension    Nonsustained ventricular tachycardia (HCC) 10/11/2019   Dizziness 10/11/2019   Abnormal echocardiogram 06/07/2019   Eustachian tube dysfunction 08/24/2018   Conductive hearing loss of both ears 08/24/2018   Pulmonary hypertension due to thromboembolism (HCC) 03/09/2018   Solitary pulmonary nodule on lung CT 03/08/2018   Ascending aortic aneurysm (HCC) 4.2 cm based on CT from December 2020 01/29/2018   Sinus bradycardia 12/22/2017   Left bundle branch block 12/19/2017   Personal history of pulmonary embolism - no longer on anticoagulation due to pt refusal to take it anymore. 12/19/2017   Chronic kidney disease, stage III (moderate) (HCC) 12/19/2017   Benign neoplasm of brain (HCC) 12/19/2017    PCP: Helyn Lobstein, MD  REFERRING PROVIDER: Helyn Lobstein, MD  REFERRING DIAG: Free Text Diagnosis R29.6, R26.89, R26.9 Mult. Falls, Poor Balance, Gait Disturbance  THERAPY DIAG:  Unsteadiness on feet  Meningioma (HCC)  Difficulty in walking, not elsewhere classified  Repeated falls  Rationale for Evaluation and Treatment: Rehabilitation  ONSET DATE: chronic   SUBJECTIVE:   SUBJECTIVE STATEMENT: About the same, dizzy and unsteady     EVAL: Having issues with balance and walking. I do have a meningioma in my spine, and every once in awhile I have pain if I twist or stress it. Still having pain in my neck and hip from a fall that I had about a year and a half ago. Thought that pain would go away but it hasn't even this far out/being this healed. I have a lot of unsteadiness, not sure I'd call it dizziness but I have to stand for a minute before I get going. Have a couple of walking sticks/canes but not sure how to use them honestly. Entire R LE and UE are  still numb. When I fell I was at a friend's house and were leaving, about an hour before that I had a glass of wine, I went out first and held the door for the next people but  didn't realize that the concrete platform ended and dropped about 15 inches and ended up stepping off that 15 inch ledge and hit my head/shoulder/hip/knee on the wall beyond that. Meningioma is still just being monitored, saw the MD and they said its not growing but its just there in the spine. Every single movement I make with my neck hurts when I get to the end of that range.   PERTINENT HISTORY: See above- complex medical hx  PAIN:  Are you having pain? No 0/10 at rest, twisting and bending 3 0r 4 /10   PRECAUTIONS: Fall and Other: caution with movements, some movements can cause increased pressure/pain at meningioma site, ICD    RED FLAGS: None   WEIGHT BEARING RESTRICTIONS: No  FALLS:  Has patient fallen in last 6 months? Yes. Number of falls 1- had a mis-step coming off a ladder, fell backwards and hit the floor pretty hard  LIVING ENVIRONMENT: Lives with: lives with their spouse Lives in: House/apartment Stairs: 5 STE with B rails, has second floor in his home but holds rail    OCCUPATION: retired   PLOF: Independent, Independent with basic ADLs, Independent with gait, and Independent with transfers  PATIENT GOALS: getting more active, improving balance   NEXT MD VISIT: Referring scheduled but unsure when   OBJECTIVE:  Note: Objective measures were completed at Evaluation unless otherwise noted.    PATIENT SURVEYS:  ABC scale 65.6%; 12/13/23 65%  COGNITION: Overall cognitive status: Within functional limits for tasks assessed     SENSATION: Complete R UE and R LE numbness (chronic)    COORDINATION  Noted some ataxia in R LE with functional movements   LOWER EXTREMITY MMT:  MMT Right eval Left eval  Hip flexion 4+ 5  Hip extension    Hip abduction 5 5  Hip adduction    Hip internal rotation    Hip external rotation    Knee flexion 5 5  Knee extension 5 5  Ankle dorsiflexion 5 5  Ankle plantarflexion    Ankle inversion    Ankle eversion      (Blank rows = not tested)   FUNCTIONAL TESTS:  5 times sit to stand: 15.4 seconds use of UEs, some difficulty stabilizing; 11/20/23 15.2 seconds use  of UEs  Timed up and go (TUG): 14.2 seconds no device but did need MinA for balance when initially standing and then turning to sit in the chair at end of test; 11/20/23 10 seconds  3 minute walk test: 662ft no device but needed MinA at times for balance on corners, RPE 4/10; 12/13/23 596ft no device distant S, RPE 3/10     12/13/23 0001   Standardized Balance Assessment   Standardized Balance Assessment Dynamic Gait Index   Dynamic Gait Index   Level Surface 2 2  Change in Gait Speed 2 2  Gait with Horizontal Head Turns 2 2  Gait with Vertical Head Turns 2 2  Gait and Pivot Turn 1 1  Step Over Obstacle 1 1  Step Around Obstacles 1 2  Steps 0 2  Total Score 11 14      GAIT: Distance walked: 618ft Assistive device utilized: None Level of assistance: Min A Comments: MinA for balance with turns and when navigating obstacles, some ataxic movements noted                                                                                                                                 TREATMENT DATE:  02/01/24 Bike L 3.5 x4 min NuStep L 5 x 3 min  Gait out the around the front back [place] into the front of the clinic. Up and down slope. Pt has a difficult time controlling velocity with down hill ambulation. Resisted gait 4 way 30lb x 4 each  4in step up x10 each some UE assit at times  Standing rows 2x10 35lb Heel raises 2x15  01/30/24 NuStep L5 x6 mins  Gait outside down hill and back up on L side fo back parking lot. Increase fatigue and instability at times, 3 standing and one seated rest needed. Pt reported that his knees feel numb Seated Rows & Lats 25lb 2x10 Shoulder Ext 10lb 2x10 4in step up x10 each some UE assit at times  20lb resisted side steps    01/25/24 NuStep L5 x74mins  Shoulder ext 10# 2x10 Standing half tandem  and full tandem in bars Tandem walking in bars  Leg ext 10# 2x12 HS curls 35# 2x12   01/23/24 Bike L3 x6 min GOALS  ABC 71.9% Gait around the back building "Shortest route" up and down sloped RPE 7 two standing and one seated rest break required, reports of bilateral knee weakness Shoulder Ext 10lb 2x10 Rows 10lb 2x10  01/18/24 NuStep L5x44mins In bars side stepping over beams  [place] taps in bars On airex reaching for numbers on wall  Side steps on beam  3# marching, hip ext, and hip abd x10  01/16/24 Nustep L5 SLS on 6in box with head swivel 6in step up  x2 each leg   S2S from elevated table x10 Ball toss on level surface Bars   Tandam walking Stand on airex  Weight shift on airex  Lateral stepping on beam SLS taping color cones   01/09/24 Bike L 4 x 1 in stopped due to R knee pain NuStep L 5 x 6 min Step ups 6" min A  Lateral 4in step ups some UE assit x10 Standing on airex weighted ball toss  Sit to stand LE on airex 2x10 elevated surface  Tandem walking in // barx Side steps on airex in // bars  01/04/24 NuStep L5x 7 mins DGI Resisted gait all directions 30lb x4 each seated rest needed during  Sit to stands LE on airex elevated surface 2x10 Alt 4in box taps 2x10  HS curls 35lb 2x10 Leg Ext 10lb 2x10  01/02/24 NuStep L5x62mins Step ups 6" minA  Step ups on airex minA and some use of handrail Walking on beam needs to hold on in bars Standing arm ext 10# 2x10 AR press 10# 2x10 STS from elevated mat 2x5   12/28/23 Bike L3 x90mins  HS curls 25# 2x12 Leg ext 10# 2x12 Side steps by counter  Standing march requires light UE support  Standing rows and ext green band 2x10 STS 2x10 from elevated mat table  Ball toss 2x10   12/26/23 Bike L 3 x 7 min HS curls 25lb 2x15  Sides steps at [employer] set with standing marches very unstable  Alt 4in box taps  Standing ball toss  On airex  Eyes closed   12/21/23  Hooklying TA sets 15x3 seconds  PPT  15x3 seconds TA set + alternating march from hooklying x20  Reviewed and enforced BLT precautions for his back until MRI can clarify what is happening- he often pushes through pain and encouraged him not to do this to avoid worsening of condition    Nustep L3x8 minutes (dropped resistance due to R knee pain) BLEs only  One foot on BOSU/one foot on solid ground 3x30 seconds (harder for him today)    PATIENT EDUCATION:  Education details: exam findings, POC Person educated: Patient Education method: Explanation, Demonstration, and Verbal cues Education comprehension: verbalized understanding, returned demonstration, and needs further education  HOME EXERCISE PROGRAM:  Access Code: [email or web address] URL: [email or web address] Date: 12/21/2023 Prepared by: Terrel Ferries  Exercises - Wide Tandem Stance with Eyes Open  - 1 x daily - 7 x weekly - 2 sets - 3 reps - 30 seconds  hold - Romberg Stance  - 1 x daily - 7 x weekly - 2 sets - 3 reps - 30 second  hold - Single Leg Stance with Support  - 1 x daily - 7 x weekly - 2 sets - 3 reps - Supine Transversus Abdominis Bracing - Hands on Stomach  - 5 x daily - 7 x weekly - 1 sets - 10 reps - 3 seconds  hold - Supine Posterior Pelvic Tilt  - 2 x daily - 7 x weekly - 1 sets - 10 reps - 3 seconds  hold - Supine March  - 1 x daily - 7 x weekly - 3 sets - 10 reps    ASSESSMENT:  CLINICAL IMPRESSION: Again pt enters feeling well but with unsteady gait. He reports having an upcoming appointment with MD this week after next, he thought it was this week. Focus on gait and postural strength. Again cues to control gait speed with downhill ambulation, he can control this some.  He is very unsteady overall but able to catch himself. Reports no falls. Tactiel cues for posture needed with rows. Increase fatigue with outdoor ambulation  EVAL: Patient is a 78 y.o. M who was seen today for physical therapy evaluation and treatment for Free Text  Diagnosis R29.6, R26.89, R26.9 Mult. Falls, Poor Balance, Gait Disturbance. Very pleasant and cooperative but very talkative during eval which did limit assessment time. Still having a lot of pain in general since his fall  and ongoing numbness in R UE/LE. Gait mechanics and balance are definitely impaired, would benefit from training with appropriate AD if he is open to this. Will benefit from skilled PT services to address his concerns and functional objective impairments to the best of our ability.   OBJECTIVE IMPAIRMENTS: Abnormal gait, cardiopulmonary status limiting activity, decreased activity tolerance, decreased balance, decreased mobility, and difficulty walking.   ACTIVITY LIMITATIONS: standing, squatting, stairs, transfers, reach over head, and locomotion level  PARTICIPATION LIMITATIONS: driving, shopping, community activity, and occupation  PERSONAL FACTORS: Age, Behavior pattern, Fitness, Past/current experiences, Social background, and Time since onset of injury/illness/exacerbation are also affecting patient's functional outcome.   REHAB POTENTIAL: Good  CLINICAL DECISION MAKING: Evolving/moderate complexity  EVALUATION COMPLEXITY: Moderate   GOALS: Goals reviewed with patient? No  SHORT TERM GOALS: Target date: 11/23/2023   Will be compliant with appropriate progressive HEP  Baseline: Goal status: MET 11/23/23  2.  Will be able to name 3 ways to prevent falls at home and in the community  Baseline:  Goal status: MET 11/23/23  3.  Will be able to perform 5xSTS and with distant S Baseline: MinA  Goal status: 12/13/23 MET   4.  Will complete TUG test in 12 seconds or less S  Baseline: MinA  Goal status: MET 11/20/23    LONG TERM GOALS: Target date: 02/01/2024      Will score at least 18 on DGI to show improved functional balance  Baseline:  Goal status: ONGOING 12/13/23 DGI 11/24, 01/04/24 progressing  14/30,  2.  Will be able to ambulate community  distances (level and inclines) no device, RPE no more than 2/10  Baseline:  Goal status: ONGOING 12/13/23 (RPE 6/10), Ongoing 01/23/24 RPE 7/10, Ongoing 02/01/24  3.  Will be able to perform all desired gym activities without increased pain from resting levels  Baseline:  Goal status: ONGOING 12/13/23 improving, improving 01/23/24, Met 02/01/24  4.  ABC score to improve by at least 20 points to show improved subjective status  Baseline:  Goal status: ONGOING 12/13/23, ONGOING 01/23/24    PLAN:  PT FREQUENCY: 2x/week  PT DURATION: 4 weeks  PLANNED INTERVENTIONS: [id number]- PT Re-evaluation, 97110-Therapeutic exercises, 97530- Therapeutic activity, 97112- Neuromuscular re-education, 97535- Self Care, [id number]- Manual therapy, [id number]- Gait training, [phone]- Aquatic Therapy, and Balance training  PLAN FOR NEXT SESSION: balance and coordination progressions with compliant surfaces , functional activity tolerance.   Towanda Fret, PTA 02/01/24 11:06 AM

## 2024-02-06 ENCOUNTER — Ambulatory Visit: Payer: Medicare Other

## 2024-02-06 ENCOUNTER — Ambulatory Visit

## 2024-02-06 DIAGNOSIS — I255 Ischemic cardiomyopathy: Secondary | ICD-10-CM

## 2024-02-07 LAB — CUP PACEART REMOTE DEVICE CHECK
Battery Remaining Longevity: 85 mo
Battery Voltage: 3 V
Brady Statistic AP VP Percent: 9.41 %
Brady Statistic AP VS Percent: 0.04 %
Brady Statistic AS VP Percent: 88.55 %
Brady Statistic AS VS Percent: 2 %
Brady Statistic RA Percent Paced: 9.42 %
Brady Statistic RV Percent Paced: 89.8 %
Date Time Interrogation Session: 20250527001602
HighPow Impedance: 68 Ohm
Implantable Lead Connection Status: 753985
Implantable Lead Connection Status: 753985
Implantable Lead Connection Status: 753985
Implantable Lead Implant Date: 20230222
Implantable Lead Implant Date: 20230222
Implantable Lead Implant Date: 20230222
Implantable Lead Location: 753858
Implantable Lead Location: 753859
Implantable Lead Location: 753860
Implantable Lead Model: 4798
Implantable Lead Model: 5076
Implantable Pulse Generator Implant Date: 20230222
Lead Channel Impedance Value: 220.723
Lead Channel Impedance Value: 228 Ohm
Lead Channel Impedance Value: 234.706
Lead Channel Impedance Value: 256.148
Lead Channel Impedance Value: 275.172
Lead Channel Impedance Value: 380 Ohm
Lead Channel Impedance Value: 399 Ohm
Lead Channel Impedance Value: 494 Ohm
Lead Channel Impedance Value: 532 Ohm
Lead Channel Impedance Value: 570 Ohm
Lead Channel Impedance Value: 570 Ohm
Lead Channel Impedance Value: 627 Ohm
Lead Channel Impedance Value: 646 Ohm
Lead Channel Impedance Value: 703 Ohm
Lead Channel Impedance Value: 817 Ohm
Lead Channel Impedance Value: 836 Ohm
Lead Channel Impedance Value: 836 Ohm
Lead Channel Impedance Value: 855 Ohm
Lead Channel Pacing Threshold Amplitude: 0.5 V
Lead Channel Pacing Threshold Amplitude: 0.75 V
Lead Channel Pacing Threshold Amplitude: 1.625 V
Lead Channel Pacing Threshold Pulse Width: 0.4 ms
Lead Channel Pacing Threshold Pulse Width: 0.4 ms
Lead Channel Pacing Threshold Pulse Width: 0.4 ms
Lead Channel Sensing Intrinsic Amplitude: 0.5 mV
Lead Channel Sensing Intrinsic Amplitude: 0.5 mV
Lead Channel Sensing Intrinsic Amplitude: 17 mV
Lead Channel Sensing Intrinsic Amplitude: 17 mV
Lead Channel Setting Pacing Amplitude: 1.75 V
Lead Channel Setting Pacing Amplitude: 2 V
Lead Channel Setting Pacing Amplitude: 2.25 V
Lead Channel Setting Pacing Pulse Width: 0.4 ms
Lead Channel Setting Pacing Pulse Width: 0.4 ms
Lead Channel Setting Sensing Sensitivity: 0.3 mV
Zone Setting Status: 755011
Zone Setting Status: 755011

## 2024-02-08 ENCOUNTER — Ambulatory Visit: Admitting: Physical Therapy

## 2024-02-09 ENCOUNTER — Ambulatory Visit: Payer: Self-pay | Admitting: Cardiology

## 2024-02-12 ENCOUNTER — Encounter: Payer: Self-pay | Admitting: Physical Therapy

## 2024-02-12 ENCOUNTER — Ambulatory Visit: Attending: Family Medicine | Admitting: Physical Therapy

## 2024-02-12 DIAGNOSIS — R296 Repeated falls: Secondary | ICD-10-CM | POA: Insufficient documentation

## 2024-02-12 DIAGNOSIS — M6281 Muscle weakness (generalized): Secondary | ICD-10-CM | POA: Diagnosis not present

## 2024-02-12 DIAGNOSIS — R262 Difficulty in walking, not elsewhere classified: Secondary | ICD-10-CM | POA: Insufficient documentation

## 2024-02-12 DIAGNOSIS — D329 Benign neoplasm of meninges, unspecified: Secondary | ICD-10-CM | POA: Diagnosis not present

## 2024-02-12 DIAGNOSIS — R2681 Unsteadiness on feet: Secondary | ICD-10-CM | POA: Insufficient documentation

## 2024-02-12 DIAGNOSIS — R29898 Other symptoms and signs involving the musculoskeletal system: Secondary | ICD-10-CM | POA: Insufficient documentation

## 2024-02-12 DIAGNOSIS — R531 Weakness: Secondary | ICD-10-CM | POA: Diagnosis not present

## 2024-02-12 NOTE — Therapy (Signed)
 OUTPATIENT PHYSICAL THERAPY LOWER EXTREMITY TREATMENT   Patient Name: Timothy Moran MRN: [id number] DOB:02-Dec-1945, 78 y.o., male Today's Date: 02/12/2024      END OF SESSION:  PT End of Session - 02/12/24 1141     Visit Number 24    Date for PT Re-Evaluation 03/13/24    PT Start Time 1145    PT Stop Time 1230    PT Time Calculation (min) 45 min    Activity Tolerance Patient tolerated treatment well    Behavior During Therapy [facility] for tasks assessed/performed                       Past Medical History:  Diagnosis Date   Acute blood loss anemia 10/05/2020   Acute respiratory failure (HCC), hypothermia therapy, vent - extubated 10/06/20    AKI (acute kidney injury) (HCC) 10/05/2020   Anoxic brain injury (HCC) 10/26/2020   Arrhythmia    Ascending aortic aneurysm (HCC) 01/29/2018   a. 2019 4.3 cm by echo; b. 02/2021 Echo: Ao root 40mm.   Benign brain tumor (HCC)    Benign neoplasm of brain (HCC) 12/19/2017   Cardiac arrest with ventricular fibrillation (HCC)    Chest pain 12/04/2017   Chronic kidney disease, stage III (moderate) (HCC) 12/19/2017   CKD (chronic kidney disease), stage III - IV (HCC)    Conductive hearing loss of both ears 08/24/2018   Coronary Artery Disease    a. 09/2020 Inf STEMI complicated by cardiac arrest/PCI: LAD 50p/m, LCX 100d (2.5 x 30 Resolute Onyx DES); b. 04/2021 PCI: 04/2021 LM nl, LAD 50p/13m (2.5x30 Onyx Frontier DES), LCX 25p, 89m (RFR 0.98), 40d ISR (RFR 0.98), OM2 25, RCA mild diff dzs.   Diabetes mellitus, type 2 (HCC) 10/06/2020   10/06/20 A1C 7%   Dizziness 10/11/2019   Encounter for imaging study to confirm orogastric (OG) tube placement    Eustachian tube dysfunction 08/24/2018   Hematuria 10/26/2020   HFrEF (heart failure with reduced ejection fraction) (HCC)    a. 11/2020 Echo: EF 25-30%; b. 02/2021 Echo: EF 25-30%, glob HK. Mild LVH. Nl RV fxn. Triv AI. Ao root 40mm.   History of pulmonary embolus (PE)    HLD  (hyperlipidemia) 10/26/2020   Hypertension    Iron deficiency anemia rec'd IV iron 10/26/2020   Ischemic cardiomyopathy    a. 11/2020 Echo: EF 25-30%; b. 02/2021 Echo: EF 25-30%, glob HK.   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 Aaron AasSpirometry 03/08/2018    FEV1 3.86 (108%)  Ratio 96 s prior rx  - PET  03/13/18   Low grade c/w adenoca > rec T surgery eval     STEMI (ST elevation myocardial infarction) (HCC) 10/04/2020   Urinary retention 10/26/2020   Vestibular schwannoma (HCC) 08/14/2020   Past Surgical History:  Procedure Laterality Date   APPENDECTOMY     BIV ICD INSERTION CRT-D N/A 11/03/2021   Procedure: BIV ICD INSERTION CRT-D;  Surgeon: Lei Pump, MD;  Location: [facility] INVASIVE CV LAB;  Service: Cardiovascular;  Laterality: N/A;   CARDIAC CATHETERIZATION     CORONARY ANGIOPLASTY WITH STENT PLACEMENT Left 04/12/2021   LAD stent placement   CORONARY PRESSURE/FFR STUDY N/A 04/12/2021   Procedure: INTRAVASCULAR PRESSURE WIRE/FFR STUDY;  Surgeon: Sammy Crisp, MD;  Location: [facility] INVASIVE CV LAB;  Service: Cardiovascular;  Laterality: N/A;   CORONARY STENT INTERVENTION N/A 04/12/2021   Procedure: CORONARY STENT INTERVENTION;  Surgeon: Sammy Crisp, MD;  Location: [facility] INVASIVE CV LAB;  Service: Cardiovascular;  Laterality: N/A;   CORONARY ULTRASOUND/IVUS N/A  04/12/2021   Procedure: Intravascular Ultrasound/IVUS;  Surgeon: Sammy Crisp, MD;  Location: [facility] INVASIVE CV LAB;  Service: Cardiovascular;  Laterality: N/A;   CORONARY/GRAFT ACUTE MI REVASCULARIZATION N/A 10/04/2020   Procedure: Coronary/Graft Acute MI Revascularization;  Surgeon: Sammy Crisp, MD;  Location: [facility] INVASIVE CV LAB;  Service: Cardiovascular;  Laterality: N/A;   HERNIA REPAIR     LEFT HEART CATH AND CORONARY ANGIOGRAPHY N/A 10/04/2020   Procedure: LEFT HEART CATH AND CORONARY ANGIOGRAPHY;  Surgeon: Sammy Crisp, MD;  Location: [facility] INVASIVE CV LAB;  Service: Cardiovascular;  Laterality: N/A;   RIGHT/LEFT HEART CATH AND CORONARY ANGIOGRAPHY N/A 04/12/2021   Procedure: RIGHT/LEFT HEART CATH AND CORONARY ANGIOGRAPHY;  Surgeon: Sammy Crisp, MD;  Location: [facility] INVASIVE CV LAB;  Service: Cardiovascular;  Laterality: N/A;   Patient Active Problem List   Diagnosis Date Noted   Anemia    Leukocytosis    Right arm weakness 01/25/2022   Right sided weakness    Fall at home 01/22/2022   Status post biventricular pacemaker - MRI compatible PPM and leads 01/22/2022   Chronic indwelling Foley catheter 01/22/2022   CKD (chronic kidney disease), stage III - IV (HCC) 06/28/2021   Chronic HFrEF (heart failure with reduced ejection fraction) (HCC) 03/31/2021   Arrhythmia    DM (diabetes mellitus), type 2 (HCC) new 10/26/2020   Urinary retention 10/26/2020   Hyperlipidemia LDL goal <70 10/26/2020   Coronary artery disease of native artery of native heart with stable angina pectoris (HCC) 10/26/2020   S/P angioplasty with stent 10/04/20 DES to LCX  10/26/2020   Ischemic cardiomyopathy 10/26/2020   Diabetes mellitus, type 2 (HCC) 10/06/2020   DOE (dyspnea on exertion)    Meningioma (HCC) 08/14/2020   Vestibular schwannoma (HCC) 08/14/2020   Cardiac arrest with ventricular fibrillation (HCC)    Pain of left hip joint 05/29/2020   Benign brain tumor (HCC)    Chronic kidney disease     History of pulmonary embolus (PE)    Hypertension    Nonsustained ventricular tachycardia (HCC) 10/11/2019   Dizziness 10/11/2019   Abnormal echocardiogram 06/07/2019   Eustachian tube dysfunction 08/24/2018   Conductive hearing loss of both ears 08/24/2018   Pulmonary hypertension due to thromboembolism (HCC) 03/09/2018   Solitary pulmonary nodule on lung CT 03/08/2018   Ascending aortic aneurysm (HCC) 4.2 cm based on CT from December 2020 01/29/2018   Sinus bradycardia 12/22/2017   Left bundle branch block 12/19/2017   Personal history of pulmonary embolism - no longer on anticoagulation due to pt refusal to take it anymore. 12/19/2017   Chronic kidney disease, stage III (moderate) (HCC) 12/19/2017   Benign neoplasm of brain (HCC) 12/19/2017    PCP: Helyn Lobstein, MD  REFERRING PROVIDER: Helyn Lobstein, MD  REFERRING DIAG: Free Text Diagnosis R29.6, R26.89, R26.9 Mult. Falls, Poor Balance, Gait Disturbance  THERAPY DIAG:  Unsteadiness on feet  Meningioma (HCC)  Difficulty in walking, not elsewhere classified  Repeated falls  Muscle weakness (generalized)  Right sided weakness  Poor body mechanics  Rationale for Evaluation and Treatment: Rehabilitation  ONSET DATE: chronic   SUBJECTIVE:   SUBJECTIVE STATEMENT: "Not as good, feeling down and dizzy"   EVAL: Having issues with balance and walking. I do have a meningioma in my spine, and every once in awhile I have pain if I twist or stress it. Still having pain in my neck and hip from a fall that I had about a year and a half ago. Thought that pain would go away but it hasn't even this far out/being this healed. I have a lot of unsteadiness, not sure I'd call it dizziness but I have to stand for a minute before I get going. Have a couple of walking sticks/canes but not sure how to use them honestly. Entire R LE and UE are  still numb. When I fell I was at a friend's house and were leaving, about an hour before that I  had a glass of wine, I went out first and held the door for the next people but didn't realize that the concrete platform ended and dropped about 15 inches and ended up stepping off that 15 inch ledge and hit my head/shoulder/hip/knee on the wall beyond that. Meningioma is still just being monitored, saw the MD and they said its not growing but its just there in the spine. Every single movement I make with my neck hurts when I get to the end of that range.   PERTINENT HISTORY: See above- complex medical hx  PAIN:  Are you having pain? 3/10 R knee with movement   PRECAUTIONS: Fall and Other: caution with movements, some movements can cause increased pressure/pain at meningioma site, ICD    RED FLAGS: None   WEIGHT BEARING RESTRICTIONS: No  FALLS:  Has patient fallen in last 6 months? Yes. Number of falls 1- had a mis-step coming off a ladder, fell backwards and hit the floor pretty hard  LIVING ENVIRONMENT: Lives with: lives with their spouse Lives in: House/apartment Stairs: 5 STE with B rails, has second floor in his home but holds rail    OCCUPATION: retired   PLOF: Independent, Independent with basic ADLs, Independent with gait, and Independent with transfers  PATIENT GOALS: getting more active, improving balance   NEXT MD VISIT: Referring scheduled but unsure when   OBJECTIVE:  Note: Objective measures were completed at Evaluation unless otherwise noted.    PATIENT SURVEYS:  ABC scale 65.6%; 12/13/23 65%  COGNITION: Overall cognitive status: Within functional limits for tasks assessed     SENSATION: Complete R UE and R LE numbness (chronic)    COORDINATION  Noted some ataxia in R LE with functional movements   LOWER EXTREMITY MMT:  MMT Right eval Left eval  Hip flexion 4+ 5  Hip extension    Hip abduction 5 5  Hip adduction    Hip internal rotation    Hip external rotation    Knee flexion 5 5  Knee extension 5 5  Ankle dorsiflexion 5 5  Ankle  plantarflexion    Ankle inversion    Ankle eversion     (Blank rows = not tested)   FUNCTIONAL TESTS:  5 times sit to stand: 15.4 seconds use of UEs, some difficulty  stabilizing; 11/20/23 15.2 seconds use of UEs  Timed up and go (TUG): 14.2 seconds no device but did need MinA for balance when initially standing and then turning to sit in the chair at end of test; 11/20/23 10 seconds  3 minute walk test: 643ft no device but needed MinA at times for balance on corners, RPE 4/10; 12/13/23 540ft no device distant S, RPE 3/10     12/13/23 0001  02/12/24  Standardized Balance Assessment    Standardized Balance Assessment Dynamic Gait Index    Dynamic Gait Index    Level Surface 2 2 3   Change in Gait Speed 2 2 3   Gait with Horizontal Head Turns 2 2 2   Gait with Vertical Head Turns 2 2 1   Gait and Pivot Turn 1 1 1   Step Over Obstacle 1 1 1   Step Around Obstacles 1 2 2   Steps 0 2 2  Total Score 11 14 15       GAIT: Distance walked: 639ft Assistive device utilized: None Level of assistance: Min A Comments: MinA for balance with turns and when navigating obstacles, some ataxic movements noted                                                                                                                                 TREATMENT DATE:  02/12/24 NuStep L5 x 6 min Goals   ABC : 1050 / 1600 = 65.6 %  DGI 15  Gait outside around front L and front center [place] in back barking lot PRE 7/10 ~ 724ft 4in step up x10 each some UE assit at times  Shoulder Ext and rows 10lb 2x12   02/01/24 Bike L 3.5 x4 min NuStep L 5 x 3 min  Gait out the around the front back [place] into the front of the clinic. Up and down slope. Pt has a difficult time controlling velocity with down hill ambulation. Resisted gait 4 way 30lb x 4 each  4in step up x10 each some UE assit at times  Standing rows 2x10 35lb Heel raises 2x15  01/30/24 NuStep L5 x6 mins  Gait outside down hill and back up on L side fo back  parking lot. Increase fatigue and instability at times, 3 standing and one seated rest needed. Pt reported that his knees feel numb Seated Rows & Lats 25lb 2x10 Shoulder Ext 10lb 2x10 4in step up x10 each some UE assit at times  20lb resisted side steps    01/25/24 NuStep L5 x42mins  Shoulder ext 10# 2x10 Standing half tandem and full tandem in bars Tandem walking in bars  Leg ext 10# 2x12 HS curls 35# 2x12   01/23/24 Bike L3 x6 min GOALS  ABC 71.9% Gait around the back building "Shortest route" up and down sloped RPE 7 two standing and one seated rest break required, reports of bilateral knee weakness Shoulder Ext 10lb 2x10 Rows 10lb 2x10  01/18/24 NuStep L5x32mins In  bars side stepping over beams  [place] taps in bars On airex reaching for numbers on wall  Side steps on beam  3# marching, hip ext, and hip abd x10  01/16/24 Nustep L5 SLS on 6in box with head swivel 6in step up  x2 each leg   S2S from elevated table x10 Ball toss on level surface Bars   Tandam walking Stand on airex  Weight shift on airex Lateral stepping on beam SLS taping color cones   01/09/24 Bike L 4 x 1 in stopped due to R knee pain NuStep L 5 x 6 min Step ups 6" min A  Lateral 4in step ups some UE assit x10 Standing on airex weighted ball toss  Sit to stand LE on airex 2x10 elevated surface  Tandem walking in // barx Side steps on airex in // bars  01/04/24 NuStep L5x 7 mins DGI Resisted gait all directions 30lb x4 each seated rest needed during  Sit to stands LE on airex elevated surface 2x10 Alt 4in box taps 2x10  HS curls 35lb 2x10 Leg Ext 10lb 2x10  01/02/24 NuStep L5x22mins Step ups 6" minA  Step ups on airex minA and some use of handrail Walking on beam needs to hold on in bars Standing arm ext 10# 2x10 AR press 10# 2x10 STS from elevated mat 2x5   PATIENT EDUCATION:  Education details: exam findings, POC Person educated: Patient Education method: Explanation,  Demonstration, and Verbal cues Education comprehension: verbalized understanding, returned demonstration, and needs further education  HOME EXERCISE PROGRAM:  Access Code: [email or web address] URL: [email or web address] Date: 12/21/2023 Prepared by: Terrel Ferries  Exercises - Wide Tandem Stance with Eyes Open  - 1 x daily - 7 x weekly - 2 sets - 3 reps - 30 seconds  hold - Romberg Stance  - 1 x daily - 7 x weekly - 2 sets - 3 reps - 30 second  hold - Single Leg Stance with Support  - 1 x daily - 7 x weekly - 2 sets - 3 reps - Supine Transversus Abdominis Bracing - Hands on Stomach  - 5 x daily - 7 x weekly - 1 sets - 10 reps - 3 seconds  hold - Supine Posterior Pelvic Tilt  - 2 x daily - 7 x weekly - 1 sets - 10 reps - 3 seconds  hold - Supine March  - 1 x daily - 7 x weekly - 3 sets - 10 reps    ASSESSMENT:  CLINICAL IMPRESSION: Again pt enters feeling well but with unsteady gait. He reports having an upcoming appointment with MD Thursday. Pt has some inconsistent improvement during therapy this could be due to his comorbidity.  He is very unsteady overall but able to catch himself. Increase fatigue with community distance ambulation. Pt does become mote unstable as he fatigues. Reports no falls. Tactiel cues for posture needed with rows.  EVAL: Patient is a 78 y.o. M who was seen today for physical therapy evaluation and treatment for Free Text Diagnosis R29.6, R26.89, R26.9 Mult. Falls, Poor Balance, Gait Disturbance. Very pleasant and cooperative but very talkative during eval which did limit assessment time. Still having a lot of pain in general since his fall  and ongoing numbness in R UE/LE. Gait mechanics and balance are definitely impaired, would benefit from training with appropriate AD if he is open to this. Will benefit from skilled PT services to address his concerns and functional objective impairments to the best of our ability.  OBJECTIVE IMPAIRMENTS: Abnormal gait,  cardiopulmonary status limiting activity, decreased activity tolerance, decreased balance, decreased mobility, and difficulty walking.   ACTIVITY LIMITATIONS: standing, squatting, stairs, transfers, reach over head, and locomotion level  PARTICIPATION LIMITATIONS: driving, shopping, community activity, and occupation  PERSONAL FACTORS: Age, Behavior pattern, Fitness, Past/current experiences, Social background, and Time since onset of injury/illness/exacerbation are also affecting patient's functional outcome.   REHAB POTENTIAL: Good  CLINICAL DECISION MAKING: Evolving/moderate complexity  EVALUATION COMPLEXITY: Moderate   GOALS: Goals reviewed with patient? No  SHORT TERM GOALS: Target date: 11/23/2023   Will be compliant with appropriate progressive HEP  Baseline: Goal status: MET 11/23/23  2.  Will be able to name 3 ways to prevent falls at home and in the community  Baseline:  Goal status: MET 11/23/23  3.  Will be able to perform 5xSTS and with distant S Baseline: MinA  Goal status: 12/13/23 MET   4.  Will complete TUG test in 12 seconds or less S  Baseline: MinA  Goal status: MET 11/20/23    LONG TERM GOALS: Target date: 02/01/2024      Will score at least 18 on DGI to show improved functional balance  Baseline:  Goal status: ONGOING 12/13/23 DGI 11/24, 01/04/24 progressing  14/30, Ongoing 02/12/24  2.  Will be able to ambulate community distances (level and inclines) no device, RPE no more than 2/10  Baseline:  Goal status: ONGOING 12/13/23 (RPE 6/10), Ongoing 01/23/24 RPE 7/10, Ongoing 02/01/24, Ongoing 02/12/24  3.  Will be able to perform all desired gym activities without increased pain from resting levels  Baseline:  Goal status: ONGOING 12/13/23 improving, improving 01/23/24, Met 02/01/24  4.  ABC score to improve by at least 20 points to show improved subjective status  Baseline:  Goal status: ONGOING 12/13/23, ONGOING 01/23/24, Ongoing 02/12/24    PLAN:  PT  FREQUENCY: 2x/week  PT DURATION: 4 weeks  PLANNED INTERVENTIONS: 97164- PT Re-evaluation, 97110-Therapeutic exercises, 97530- Therapeutic activity, 97112- Neuromuscular re-education, 97535- Self Care, [id number]- Manual therapy, [id number]- Gait training, [phone]- Aquatic Therapy, and Balance training  PLAN FOR NEXT SESSION: balance and coordination progressions with compliant surfaces , functional activity tolerance.   Towanda Fret, PTA 02/12/24 12:41 PM

## 2024-02-14 ENCOUNTER — Encounter: Payer: Self-pay | Admitting: Physical Therapy

## 2024-02-14 ENCOUNTER — Ambulatory Visit: Admitting: Physical Therapy

## 2024-02-14 DIAGNOSIS — R2681 Unsteadiness on feet: Secondary | ICD-10-CM

## 2024-02-14 DIAGNOSIS — R262 Difficulty in walking, not elsewhere classified: Secondary | ICD-10-CM | POA: Diagnosis not present

## 2024-02-14 DIAGNOSIS — M6281 Muscle weakness (generalized): Secondary | ICD-10-CM

## 2024-02-14 DIAGNOSIS — D329 Benign neoplasm of meninges, unspecified: Secondary | ICD-10-CM

## 2024-02-14 DIAGNOSIS — R531 Weakness: Secondary | ICD-10-CM | POA: Diagnosis not present

## 2024-02-14 DIAGNOSIS — R296 Repeated falls: Secondary | ICD-10-CM

## 2024-02-14 NOTE — Therapy (Signed)
 OUTPATIENT PHYSICAL THERAPY LOWER EXTREMITY TREATMENT   Patient Name: Timothy Moran MRN: [id number] DOB:[birth year], 78 y.o., male Today's Date: 02/14/2024      END OF SESSION:  PT End of Session - 02/14/24 1138     Visit Number 25    Date for PT Re-Evaluation 03/13/24    PT Start Time 1138    PT Stop Time 1220    PT Time Calculation (min) 42 min                       Past Medical History:  Diagnosis Date   Acute blood loss anemia 10/05/2020   Acute respiratory failure (HCC), hypothermia therapy, vent - extubated 10/06/20    AKI (acute kidney injury) (HCC) 10/05/2020   Anoxic brain injury (HCC) 10/26/2020   Arrhythmia    Ascending aortic aneurysm (HCC) 01/29/2018   a. 2019 4.3 cm by echo; b. 02/2021 Echo: Ao root 40mm.   Benign brain tumor (HCC)    Benign neoplasm of brain (HCC) 12/19/2017   Cardiac arrest with ventricular fibrillation (HCC)    Chest pain 12/04/2017   Chronic kidney disease, stage III (moderate) (HCC) 12/19/2017   CKD (chronic kidney disease), stage III - IV (HCC)    Conductive hearing loss of both ears 08/24/2018   Coronary Artery Disease    a. 09/2020 Inf STEMI complicated by cardiac arrest/PCI: LAD 50p/m, LCX 100d (2.5 x 30 Resolute Onyx DES); b. 04/2021 PCI: 04/2021 LM nl, LAD 50p/41m (2.5x30 Onyx Frontier DES), LCX 25p, 87m (RFR 0.98), 40d ISR (RFR 0.98), OM2 25, RCA mild diff dzs.   Diabetes mellitus, type 2 (HCC) 10/06/2020   10/06/20 A1C 7%   Dizziness 10/11/2019   Encounter for imaging study to confirm orogastric (OG) tube placement    Eustachian tube dysfunction 08/24/2018   Hematuria 10/26/2020   HFrEF (heart failure with reduced ejection fraction) (HCC)    a. 11/2020 Echo: EF 25-30%; b. 02/2021 Echo: EF 25-30%, glob HK. Mild LVH. Nl RV fxn. Triv AI. Ao root 40mm.   History of pulmonary embolus (PE)    HLD (hyperlipidemia) 10/26/2020   Hypertension    Iron deficiency anemia rec'd IV iron 10/26/2020   Ischemic cardiomyopathy    a.  11/2020 Echo: EF 25-30%; b. 02/2021 Echo: EF 25-30%, glob HK.   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 Aaron AasSpirometry 03/08/2018    FEV1 3.86 (108%)  Ratio 96 s prior rx  - PET  03/13/18   Low grade c/w adenoca > rec T surgery eval     STEMI (ST elevation myocardial infarction) (HCC) 10/04/2020   Urinary retention 10/26/2020   Vestibular schwannoma (HCC) 08/14/2020   Past Surgical History:  Procedure Laterality Date   APPENDECTOMY     BIV ICD INSERTION CRT-D N/A 11/03/2021  Procedure: BIV ICD INSERTION CRT-D;  Surgeon: Lei Pump, MD;  Location: [facility] INVASIVE CV LAB;  Service: Cardiovascular;  Laterality: N/A;   CARDIAC CATHETERIZATION     CORONARY ANGIOPLASTY WITH STENT PLACEMENT Left 04/12/2021   LAD stent placement   CORONARY PRESSURE/FFR STUDY N/A 04/12/2021   Procedure: INTRAVASCULAR PRESSURE WIRE/FFR STUDY;  Surgeon: Sammy Crisp, MD;  Location: [facility] INVASIVE CV LAB;  Service: Cardiovascular;  Laterality: N/A;   CORONARY STENT INTERVENTION N/A 04/12/2021   Procedure: CORONARY STENT INTERVENTION;  Surgeon: Sammy Crisp, MD;  Location: [facility] INVASIVE CV LAB;  Service: Cardiovascular;  Laterality: N/A;   CORONARY ULTRASOUND/IVUS N/A 04/12/2021   Procedure: Intravascular Ultrasound/IVUS;  Surgeon: Sammy Crisp, MD;  Location: [facility] INVASIVE CV LAB;  Service:  Cardiovascular;  Laterality: N/A;   CORONARY/GRAFT ACUTE MI REVASCULARIZATION N/A 10/04/2020   Procedure: Coronary/Graft Acute MI Revascularization;  Surgeon: Sammy Crisp, MD;  Location: [facility] INVASIVE CV LAB;  Service: Cardiovascular;  Laterality: N/A;   HERNIA REPAIR     LEFT HEART CATH AND CORONARY ANGIOGRAPHY N/A 10/04/2020   Procedure: LEFT HEART CATH AND CORONARY ANGIOGRAPHY;  Surgeon: Sammy Crisp, MD;  Location: [facility] INVASIVE CV LAB;  Service: Cardiovascular;  Laterality: N/A;   RIGHT/LEFT HEART CATH AND CORONARY ANGIOGRAPHY N/A 04/12/2021   Procedure: RIGHT/LEFT HEART CATH AND CORONARY ANGIOGRAPHY;  Surgeon: Sammy Crisp, MD;  Location: [facility] INVASIVE CV LAB;  Service: Cardiovascular;  Laterality: N/A;   Patient Active Problem List   Diagnosis Date Noted   Anemia    Leukocytosis    Right arm weakness 01/25/2022   Right sided weakness    Fall at home 01/22/2022   Status post biventricular pacemaker - MRI compatible PPM and leads 01/22/2022   Chronic indwelling Foley catheter 01/22/2022   CKD (chronic kidney disease), stage III - IV (HCC) 06/28/2021   Chronic HFrEF (heart failure with reduced ejection fraction) (HCC) 03/31/2021   Arrhythmia    DM (diabetes mellitus), type 2 (HCC) new 10/26/2020   Urinary retention 10/26/2020   Hyperlipidemia LDL goal <70 10/26/2020   Coronary artery disease of native artery of native heart with stable angina pectoris (HCC) 10/26/2020   S/P angioplasty with stent 10/04/20 DES to LCX  10/26/2020   Ischemic cardiomyopathy 10/26/2020   Diabetes mellitus, type 2 (HCC) 10/06/2020   DOE (dyspnea on exertion)    Meningioma (HCC) 08/14/2020   Vestibular schwannoma (HCC) 08/14/2020   Cardiac arrest with ventricular fibrillation (HCC)    Pain of left hip joint 05/29/2020   Benign brain tumor (HCC)    Chronic kidney disease    History of pulmonary embolus (PE)    Hypertension    Nonsustained ventricular tachycardia (HCC) 10/11/2019   Dizziness  10/11/2019   Abnormal echocardiogram 06/07/2019   Eustachian tube dysfunction 08/24/2018   Conductive hearing loss of both ears 08/24/2018   Pulmonary hypertension due to thromboembolism (HCC) 03/09/2018   Solitary pulmonary nodule on lung CT 03/08/2018   Ascending aortic aneurysm (HCC) 4.2 cm based on CT from December 2020 01/29/2018   Sinus bradycardia 12/22/2017   Left bundle branch block 12/19/2017   Personal history of pulmonary embolism - no longer on anticoagulation due to pt refusal to take it anymore. 12/19/2017   Chronic kidney disease, stage III (moderate) (HCC) 12/19/2017   Benign neoplasm of brain (HCC) 12/19/2017    PCP: Helyn Lobstein, MD  REFERRING PROVIDER: Helyn Lobstein, MD  REFERRING DIAG: Free Text Diagnosis R29.6, R26.89, R26.9 Mult. Falls, Poor Balance, Gait Disturbance  THERAPY DIAG:  No diagnosis found.  Rationale for Evaluation and Treatment: Rehabilitation  ONSET DATE: chronic   SUBJECTIVE:   SUBJECTIVE STATEMENT: Not too good walked an hour in [employer] yesterday. Feels unsteady    EVAL: Having issues with balance and walking. I do have a meningioma in my spine, and every once in awhile I have pain if I twist or stress it. Still having pain in my neck and hip from a fall that I had about a year and a half ago. Thought that pain would go away but it hasn't even this far out/being this healed. I have a lot of unsteadiness, not sure I'd call it dizziness but I have to stand for a minute before I get going. Have a couple of walking sticks/canes but not sure how to use them honestly. Entire R LE and UE are  still numb. When I fell I was at a friend's house and were leaving, about an hour before that I had a glass of wine, I went out first and held the door for the next people but didn't realize that the concrete platform ended and dropped about 15 inches and ended up stepping off that 15 inch ledge and hit my head/shoulder/hip/knee on the wall beyond that.  Meningioma is still just being monitored, saw the MD and they said its not growing but its just there in the spine. Every single movement I make with my neck hurts when I get to the end of that range.   PERTINENT HISTORY: See above- complex medical hx  PAIN:  Are you having pain? 3/10 R knee with movement   PRECAUTIONS: Fall and Other: caution with movements, some movements can cause increased pressure/pain at meningioma site, ICD    RED FLAGS: None   WEIGHT BEARING RESTRICTIONS: No  FALLS:  Has patient fallen in last 6 months? Yes. Number of falls 1- had a mis-step coming off a ladder, fell backwards and hit the floor pretty hard  LIVING ENVIRONMENT: Lives with: lives with their spouse Lives in: House/apartment Stairs: 5 STE with B rails, has second floor in his home but holds rail    OCCUPATION: retired   PLOF: Independent, Independent with basic ADLs, Independent with gait, and Independent with transfers  PATIENT GOALS: getting more active, improving balance   NEXT MD VISIT: Referring scheduled but unsure when   OBJECTIVE:  Note: Objective measures were completed at Evaluation unless otherwise noted.    PATIENT SURVEYS:  ABC scale 65.6%; 12/13/23 65%  COGNITION: Overall cognitive status: Within functional limits for tasks assessed     SENSATION: Complete R UE and R LE numbness (chronic)    COORDINATION  Noted some ataxia in R LE with functional movements   LOWER EXTREMITY MMT:  MMT Right eval Left eval  Hip flexion 4+ 5  Hip extension    Hip abduction 5 5  Hip adduction    Hip internal rotation    Hip external rotation    Knee flexion 5 5  Knee extension 5 5  Ankle dorsiflexion 5 5  Ankle plantarflexion    Ankle inversion    Ankle eversion     (Blank rows = not tested)   FUNCTIONAL TESTS:  5 times sit to stand: 15.4 seconds use of UEs, some difficulty stabilizing; 11/20/23 15.2 seconds use of UEs  Timed up and go (TUG): 14.2 seconds no  device but did need MinA for balance when initially standing and then turning to sit in the chair at end of test;  11/20/23 10 seconds  3 minute walk test: 666ft no device but needed MinA at times for balance on corners, RPE 4/10; 12/13/23 580ft no device distant S, RPE 3/10     12/13/23 0001  02/12/24  Standardized Balance Assessment    Standardized Balance Assessment Dynamic Gait Index    Dynamic Gait Index    Level Surface 2 2 3   Change in Gait Speed 2 2 3   Gait with Horizontal Head Turns 2 2 2   Gait with Vertical Head Turns 2 2 1   Gait and Pivot Turn 1 1 1   Step Over Obstacle 1 1 1   Step Around Obstacles 1 2 2   Steps 0 2 2  Total Score 11 14 15       GAIT: Distance walked: 639ft Assistive device utilized: None Level of assistance: Min A Comments: MinA for balance with turns and when navigating obstacles, some ataxic movements noted                                                                                                                                 TREATMENT DATE:  02/14/24 NuStep L5 x 6 min Resisted gait 4 way 30lb x 3 each  Seated rest needed between sets 4in step up x10 each some UE assit at times  HS curls 35lb 2x15 Leg Ext 10lb 2x10   02/12/24 NuStep L5 x 6 min Goals   ABC : 1050 / 1600 = 65.6 %  DGI 15  Gait outside around front L and front center [place] in back barking lot PRE 7/10 ~ 725ft 4in step up x10 each some UE assit at times  Shoulder Ext and rows 10lb 2x12   02/01/24 Bike L 3.5 x4 min NuStep L 5 x 3 min  Gait out the around the front back [place] into the front of the clinic. Up and down slope. Pt has a difficult time controlling velocity with down hill ambulation. Resisted gait 4 way 30lb x 4 each  4in step up x10 each some UE assit at times  Standing rows 2x10 35lb Heel raises 2x15  01/30/24 NuStep L5 x6 mins  Gait outside down hill and back up on L side fo back parking lot. Increase fatigue and instability at times, 3 standing and one  seated rest needed. Pt reported that his knees feel numb Seated Rows & Lats 25lb 2x10 Shoulder Ext 10lb 2x10 4in step up x10 each some UE assit at times  20lb resisted side steps    PATIENT EDUCATION:  Education details: exam findings, POC Person educated: Patient Education method: Explanation, Demonstration, and Verbal cues Education comprehension: verbalized understanding, returned demonstration, and needs further education  HOME EXERCISE PROGRAM:  Access Code: [email or web address] URL: [email or web address] Date: 12/21/2023 Prepared by: Terrel Ferries  Exercises - Wide Tandem Stance with Eyes Open  - 1 x daily - 7 x weekly - 2 sets - 3 reps - 30 seconds  hold - Romberg Stance  -  1 x daily - 7 x weekly - 2 sets - 3 reps - 30 second  hold - Single Leg Stance with Support  - 1 x daily - 7 x weekly - 2 sets - 3 reps - Supine Transversus Abdominis Bracing - Hands on Stomach  - 5 x daily - 7 x weekly - 1 sets - 10 reps - 3 seconds  hold - Supine Posterior Pelvic Tilt  - 2 x daily - 7 x weekly - 1 sets - 10 reps - 3 seconds  hold - Supine March  - 1 x daily - 7 x weekly - 3 sets - 10 reps    ASSESSMENT:  CLINICAL IMPRESSION: Again pt enters feeling well but with unsteady gait. He reports having an upcoming appointment with MD Thursday. HE voiced that's he will be acquiring about his balance and back pain tomorrow. He remains very unstable with gait and all standing activities. He goes recover on his own after becoming unsteady most of the time. Increase fatigue with activity. When he fatigues he report that both knees feel numb.  Reports no falls.   EVAL: Patient is a 78 y.o. M who was seen today for physical therapy evaluation and treatment for Free Text Diagnosis R29.6, R26.89, R26.9 Mult. Falls, Poor Balance, Gait Disturbance. Very pleasant and cooperative but very talkative during eval which did limit assessment time. Still having a lot of pain in general since his fall  and  ongoing numbness in R UE/LE. Gait mechanics and balance are definitely impaired, would benefit from training with appropriate AD if he is open to this. Will benefit from skilled PT services to address his concerns and functional objective impairments to the best of our ability.   OBJECTIVE IMPAIRMENTS: Abnormal gait, cardiopulmonary status limiting activity, decreased activity tolerance, decreased balance, decreased mobility, and difficulty walking.   ACTIVITY LIMITATIONS: standing, squatting, stairs, transfers, reach over head, and locomotion level  PARTICIPATION LIMITATIONS: driving, shopping, community activity, and occupation  PERSONAL FACTORS: Age, Behavior pattern, Fitness, Past/current experiences, Social background, and Time since onset of injury/illness/exacerbation are also affecting patient's functional outcome.   REHAB POTENTIAL: Good  CLINICAL DECISION MAKING: Evolving/moderate complexity  EVALUATION COMPLEXITY: Moderate   GOALS: Goals reviewed with patient? No  SHORT TERM GOALS: Target date: 11/23/2023   Will be compliant with appropriate progressive HEP  Baseline: Goal status: MET 11/23/23  2.  Will be able to name 3 ways to prevent falls at home and in the community  Baseline:  Goal status: MET 11/23/23  3.  Will be able to perform 5xSTS and with distant S Baseline: MinA  Goal status: 12/13/23 MET   4.  Will complete TUG test in 12 seconds or less S  Baseline: MinA  Goal status: MET 11/20/23    LONG TERM GOALS: Target date: 02/01/2024      Will score at least 18 on DGI to show improved functional balance  Baseline:  Goal status: ONGOING 12/13/23 DGI 11/24, 01/04/24 progressing  14/30, Ongoing 02/12/24  2.  Will be able to ambulate community distances (level and inclines) no device, RPE no more than 2/10  Baseline:  Goal status: ONGOING 12/13/23 (RPE 6/10), Ongoing 01/23/24 RPE 7/10, Ongoing 02/01/24, Ongoing 02/12/24  3.  Will be able to perform all desired  gym activities without increased pain from resting levels  Baseline:  Goal status: ONGOING 12/13/23 improving, improving 01/23/24, Met 02/01/24  4.  ABC score to improve by at least 20 points to show improved  subjective status  Baseline:  Goal status: ONGOING 12/13/23, ONGOING 01/23/24, Ongoing 02/12/24    PLAN:  PT FREQUENCY: 2x/week  PT DURATION: 4 weeks  PLANNED INTERVENTIONS: [id number]- PT Re-evaluation, 97110-Therapeutic exercises, 97530- Therapeutic activity, 97112- Neuromuscular re-education, 97535- Self Care, [id number]- Manual therapy, [id number]- Gait training, [phone]- Aquatic Therapy, and Balance training  PLAN FOR NEXT SESSION: balance and coordination progressions with compliant surfaces , functional activity tolerance.   Towanda Fret, PTA 02/14/24 11:39 AM

## 2024-02-15 DIAGNOSIS — R269 Unspecified abnormalities of gait and mobility: Secondary | ICD-10-CM | POA: Diagnosis not present

## 2024-02-15 DIAGNOSIS — Z6829 Body mass index (BMI) 29.0-29.9, adult: Secondary | ICD-10-CM | POA: Diagnosis not present

## 2024-02-15 DIAGNOSIS — R7989 Other specified abnormal findings of blood chemistry: Secondary | ICD-10-CM | POA: Diagnosis not present

## 2024-02-15 DIAGNOSIS — G8929 Other chronic pain: Secondary | ICD-10-CM | POA: Diagnosis not present

## 2024-02-15 DIAGNOSIS — R946 Abnormal results of thyroid function studies: Secondary | ICD-10-CM | POA: Diagnosis not present

## 2024-02-15 DIAGNOSIS — M545 Low back pain, unspecified: Secondary | ICD-10-CM | POA: Diagnosis not present

## 2024-02-15 DIAGNOSIS — E1122 Type 2 diabetes mellitus with diabetic chronic kidney disease: Secondary | ICD-10-CM | POA: Diagnosis not present

## 2024-02-19 ENCOUNTER — Encounter: Payer: Self-pay | Admitting: Physical Therapy

## 2024-02-19 ENCOUNTER — Ambulatory Visit: Admitting: Physical Therapy

## 2024-02-19 DIAGNOSIS — R296 Repeated falls: Secondary | ICD-10-CM

## 2024-02-19 DIAGNOSIS — D329 Benign neoplasm of meninges, unspecified: Secondary | ICD-10-CM | POA: Diagnosis not present

## 2024-02-19 DIAGNOSIS — M6281 Muscle weakness (generalized): Secondary | ICD-10-CM | POA: Diagnosis not present

## 2024-02-19 DIAGNOSIS — R262 Difficulty in walking, not elsewhere classified: Secondary | ICD-10-CM

## 2024-02-19 DIAGNOSIS — R531 Weakness: Secondary | ICD-10-CM | POA: Diagnosis not present

## 2024-02-19 DIAGNOSIS — R2681 Unsteadiness on feet: Secondary | ICD-10-CM

## 2024-02-19 NOTE — Therapy (Signed)
 OUTPATIENT PHYSICAL THERAPY LOWER EXTREMITY TREATMENT   Patient Name: Timothy Moran MRN: [id number] DOB:1946-08-20, 78 y.o., male Today's Date: 02/19/2024      END OF SESSION:  PT End of Session - 02/19/24 1141     Visit Number 26    Date for PT Re-Evaluation 03/13/24    PT Start Time 1145    PT Stop Time 1230    PT Time Calculation (min) 45 min    Activity Tolerance Patient tolerated treatment well    Behavior During Therapy [facility] for tasks assessed/performed                       Past Medical History:  Diagnosis Date   Acute blood loss anemia 10/05/2020   Acute respiratory failure (HCC), hypothermia therapy, vent - extubated 10/06/20    AKI (acute kidney injury) (HCC) 10/05/2020   Anoxic brain injury (HCC) 10/26/2020   Arrhythmia    Ascending aortic aneurysm (HCC) 01/29/2018   a. 2019 4.3 cm by echo; b. 02/2021 Echo: Ao root 40mm.   Benign brain tumor (HCC)    Benign neoplasm of brain (HCC) 12/19/2017   Cardiac arrest with ventricular fibrillation (HCC)    Chest pain 12/04/2017   Chronic kidney disease, stage III (moderate) (HCC) 12/19/2017   CKD (chronic kidney disease), stage III - IV (HCC)    Conductive hearing loss of both ears 08/24/2018   Coronary Artery Disease    a. 09/2020 Inf STEMI complicated by cardiac arrest/PCI: LAD 50p/m, LCX 100d (2.5 x 30 Resolute Onyx DES); b. 04/2021 PCI: 04/2021 LM nl, LAD 50p/87m (2.5x30 Onyx Frontier DES), LCX 25p, 53m (RFR 0.98), 40d ISR (RFR 0.98), OM2 25, RCA mild diff dzs.   Diabetes mellitus, type 2 (HCC) 10/06/2020   10/06/20 A1C 7%   Dizziness 10/11/2019   Encounter for imaging study to confirm orogastric (OG) tube placement    Eustachian tube dysfunction 08/24/2018   Hematuria 10/26/2020   HFrEF (heart failure with reduced ejection fraction) (HCC)    a. 11/2020 Echo: EF 25-30%; b. 02/2021 Echo: EF 25-30%, glob HK. Mild LVH. Nl RV fxn. Triv AI. Ao root 40mm.   History of pulmonary embolus (PE)    HLD  (hyperlipidemia) 10/26/2020   Hypertension    Iron deficiency anemia rec'd IV iron 10/26/2020   Ischemic cardiomyopathy    a. 11/2020 Echo: EF 25-30%; b. 02/2021 Echo: EF 25-30%, glob HK.   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 Aaron AasSpirometry 03/08/2018    FEV1 3.86 (108%)  Ratio 96 s prior rx  - PET  03/13/18   Low grade c/w adenoca > rec T surgery eval     STEMI (ST elevation myocardial infarction) (HCC) 10/04/2020   Urinary retention 10/26/2020   Vestibular schwannoma (HCC) 08/14/2020   Past Surgical History:  Procedure Laterality Date   APPENDECTOMY     BIV ICD INSERTION CRT-D N/A 11/03/2021   Procedure: BIV ICD INSERTION CRT-D;  Surgeon: Lei Pump, MD;  Location: [facility] INVASIVE CV LAB;  Service: Cardiovascular;  Laterality: N/A;   CARDIAC CATHETERIZATION     CORONARY ANGIOPLASTY WITH STENT PLACEMENT Left 04/12/2021   LAD stent placement   CORONARY PRESSURE/FFR STUDY N/A 04/12/2021   Procedure: INTRAVASCULAR PRESSURE WIRE/FFR STUDY;  Surgeon: Sammy Crisp, MD;  Location: [facility] INVASIVE CV LAB;  Service: Cardiovascular;  Laterality: N/A;   CORONARY STENT INTERVENTION N/A 04/12/2021   Procedure: CORONARY STENT INTERVENTION;  Surgeon: Sammy Crisp, MD;  Location: [facility] INVASIVE CV LAB;  Service: Cardiovascular;  Laterality: N/A;   CORONARY ULTRASOUND/IVUS N/A  04/12/2021   Procedure: Intravascular Ultrasound/IVUS;  Surgeon: Sammy Crisp, MD;  Location: [facility] INVASIVE CV LAB;  Service: Cardiovascular;  Laterality: N/A;   CORONARY/GRAFT ACUTE MI REVASCULARIZATION N/A 10/04/2020   Procedure: Coronary/Graft Acute MI Revascularization;  Surgeon: Sammy Crisp, MD;  Location: [facility] INVASIVE CV LAB;  Service: Cardiovascular;  Laterality: N/A;   HERNIA REPAIR     LEFT HEART CATH AND CORONARY ANGIOGRAPHY N/A 10/04/2020   Procedure: LEFT HEART CATH AND CORONARY ANGIOGRAPHY;  Surgeon: Sammy Crisp, MD;  Location: [facility] INVASIVE CV LAB;  Service: Cardiovascular;  Laterality: N/A;   RIGHT/LEFT HEART CATH AND CORONARY ANGIOGRAPHY N/A 04/12/2021   Procedure: RIGHT/LEFT HEART CATH AND CORONARY ANGIOGRAPHY;  Surgeon: Sammy Crisp, MD;  Location: [facility] INVASIVE CV LAB;  Service: Cardiovascular;  Laterality: N/A;   Patient Active Problem List   Diagnosis Date Noted   Anemia    Leukocytosis    Right arm weakness 01/25/2022   Right sided weakness    Fall at home 01/22/2022   Status post biventricular pacemaker - MRI compatible PPM and leads 01/22/2022   Chronic indwelling Foley catheter 01/22/2022   CKD (chronic kidney disease), stage III - IV (HCC) 06/28/2021   Chronic HFrEF (heart failure with reduced ejection fraction) (HCC) 03/31/2021   Arrhythmia    DM (diabetes mellitus), type 2 (HCC) new 10/26/2020   Urinary retention 10/26/2020   Hyperlipidemia LDL goal <70 10/26/2020   Coronary artery disease of native artery of native heart with stable angina pectoris (HCC) 10/26/2020   S/P angioplasty with stent 10/04/20 DES to LCX  10/26/2020   Ischemic cardiomyopathy 10/26/2020   Diabetes mellitus, type 2 (HCC) 10/06/2020   DOE (dyspnea on exertion)    Meningioma (HCC) 08/14/2020   Vestibular schwannoma (HCC) 08/14/2020   Cardiac arrest with ventricular fibrillation (HCC)    Pain of left hip joint 05/29/2020   Benign brain tumor (HCC)    Chronic kidney disease     History of pulmonary embolus (PE)    Hypertension    Nonsustained ventricular tachycardia (HCC) 10/11/2019   Dizziness 10/11/2019   Abnormal echocardiogram 06/07/2019   Eustachian tube dysfunction 08/24/2018   Conductive hearing loss of both ears 08/24/2018   Pulmonary hypertension due to thromboembolism (HCC) 03/09/2018   Solitary pulmonary nodule on lung CT 03/08/2018   Ascending aortic aneurysm (HCC) 4.2 cm based on CT from December 2020 01/29/2018   Sinus bradycardia 12/22/2017   Left bundle branch block 12/19/2017   Personal history of pulmonary embolism - no longer on anticoagulation due to pt refusal to take it anymore. 12/19/2017   Chronic kidney disease, stage III (moderate) (HCC) 12/19/2017   Benign neoplasm of brain (HCC) 12/19/2017    PCP: Helyn Lobstein, MD  REFERRING PROVIDER: Helyn Lobstein, MD  REFERRING DIAG: Free Text Diagnosis R29.6, R26.89, R26.9 Mult. Falls, Poor Balance, Gait Disturbance  THERAPY DIAG:  Unsteadiness on feet  Difficulty in walking, not elsewhere classified  Meningioma (HCC)  Repeated falls  Rationale for Evaluation and Treatment: Rehabilitation  ONSET DATE: chronic   SUBJECTIVE:   SUBJECTIVE STATEMENT: Balance is due to age and the possibilities of the meningioma causing it but unable to tell.   EVAL: Having issues with balance and walking. I do have a meningioma in my spine, and every once in awhile I have pain if I twist or stress it. Still having pain in my neck and hip from a fall that I had about a year and a half ago. Thought that pain would go away but it hasn't even this far out/being this healed. I have a lot of unsteadiness, not sure I'd call it dizziness but I have to stand for a minute before I get going. Have a couple of walking sticks/canes but not sure how to use them honestly. Entire R LE and UE are  still numb. When I fell I was at a friend's house and were leaving, about an hour before that I had a glass of wine, I went  out first and held the door for the next people but didn't realize that the concrete platform ended and dropped about 15 inches and ended up stepping off that 15 inch ledge and hit my head/shoulder/hip/knee on the wall beyond that. Meningioma is still just being monitored, saw the MD and they said its not growing but its just there in the spine. Every single movement I make with my neck hurts when I get to the end of that range.   PERTINENT HISTORY: See above- complex medical hx  PAIN:  Are you having pain? 2/10 R knee with movement   PRECAUTIONS: Fall and Other: caution with movements, some movements can cause increased pressure/pain at meningioma site, ICD    RED FLAGS: None   WEIGHT BEARING RESTRICTIONS: No  FALLS:  Has patient fallen in last 6 months? Yes. Number of falls 1- had a mis-step coming off a ladder, fell backwards and hit the floor pretty hard  LIVING ENVIRONMENT: Lives with: lives with their spouse Lives in: House/apartment Stairs: 5 STE with B rails, has second floor in his home but holds rail    OCCUPATION: retired   PLOF: Independent, Independent with basic ADLs, Independent with gait, and Independent with transfers  PATIENT GOALS: getting more active, improving balance   NEXT MD VISIT: Referring scheduled but unsure when   OBJECTIVE:  Note: Objective measures were completed at Evaluation unless otherwise noted.    PATIENT SURVEYS:  ABC scale 65.6%; 12/13/23 65%  COGNITION: Overall cognitive status: Within functional limits for tasks assessed     SENSATION: Complete R UE and R LE numbness (chronic)    COORDINATION  Noted some ataxia in R LE with functional movements   LOWER EXTREMITY MMT:  MMT Right eval Left eval  Hip flexion 4+ 5  Hip extension    Hip abduction 5 5  Hip adduction    Hip internal rotation    Hip external rotation    Knee flexion 5 5  Knee extension 5 5  Ankle dorsiflexion 5 5  Ankle plantarflexion    Ankle  inversion    Ankle eversion     (Blank rows = not tested)   FUNCTIONAL TESTS:  5 times sit to stand: 15.4 seconds use of UEs, some difficulty stabilizing; 11/20/23  15.2 seconds use of UEs  Timed up and go (TUG): 14.2 seconds no device but did need MinA for balance when initially standing and then turning to sit in the chair at end of test; 11/20/23 10 seconds  3 minute walk test: 658ft no device but needed MinA at times for balance on corners, RPE 4/10; 12/13/23 585ft no device distant S, RPE 3/10     12/13/23 0001  02/12/24  Standardized Balance Assessment    Standardized Balance Assessment Dynamic Gait Index    Dynamic Gait Index    Level Surface 2 2 3   Change in Gait Speed 2 2 3   Gait with Horizontal Head Turns 2 2 2   Gait with Vertical Head Turns 2 2 1   Gait and Pivot Turn 1 1 1   Step Over Obstacle 1 1 1   Step Around Obstacles 1 2 2   Steps 0 2 2  Total Score 11 14 15       GAIT: Distance walked: 6107ft Assistive device utilized: None Level of assistance: Min A Comments: MinA for balance with turns and when navigating obstacles, some ataxic movements noted                                                                                                                                 TREATMENT DATE:  02/19/24 NuStep L5 x 7 min HS curls 35lb 2x15 Leg Ext 10lb 2x15 S2S LE on airex elevated surface 2x10 Alt 6in box taps w/ SPC 2x10 Leg press 40lb 2x10  Stopped after 1 set due to R knee pain Shoulder Ext and rows 10lb 2x12   02/14/24 NuStep L5 x 6 min Resisted gait 4 way 30lb x 3 each  Seated rest needed between sets 4in step up x10 each some UE assit at times  HS curls 35lb 2x15 Leg Ext 10lb 2x10   02/12/24 NuStep L5 x 6 min Goals   ABC : 1050 / 1600 = 65.6 %  DGI 15  Gait outside around front L and front center [place] in back barking lot PRE 7/10 ~ 775ft 4in step up x10 each some UE assit at times  Shoulder Ext and rows 10lb 2x12   02/01/24 Bike L 3.5 x4  min NuStep L 5 x 3 min  Gait out the around the front back [place] into the front of the clinic. Up and down slope. Pt has a difficult time controlling velocity with down hill ambulation. Resisted gait 4 way 30lb x 4 each  4in step up x10 each some UE assit at times  Standing rows 2x10 35lb Heel raises 2x15  01/30/24 NuStep L5 x6 mins  Gait outside down hill and back up on L side fo back parking lot. Increase fatigue and instability at times, 3 standing and one seated rest needed. Pt reported that his knees feel numb Seated Rows & Lats 25lb 2x10 Shoulder Ext 10lb 2x10 4in step up x10 each some UE assit  at times  20lb resisted side steps    PATIENT EDUCATION:  Education details: exam findings, POC Person educated: Patient Education method: Explanation, Demonstration, and Verbal cues Education comprehension: verbalized understanding, returned demonstration, and needs further education  HOME EXERCISE PROGRAM:  Access Code: [email or web address] URL: [email or web address] Date: 12/21/2023 Prepared by: Terrel Ferries  Exercises - Wide Tandem Stance with Eyes Open  - 1 x daily - 7 x weekly - 2 sets - 3 reps - 30 seconds  hold - Romberg Stance  - 1 x daily - 7 x weekly - 2 sets - 3 reps - 30 second  hold - Single Leg Stance with Support  - 1 x daily - 7 x weekly - 2 sets - 3 reps - Supine Transversus Abdominis Bracing - Hands on Stomach  - 5 x daily - 7 x weekly - 1 sets - 10 reps - 3 seconds  hold - Supine Posterior Pelvic Tilt  - 2 x daily - 7 x weekly - 1 sets - 10 reps - 3 seconds  hold - Supine March  - 1 x daily - 7 x weekly - 3 sets - 10 reps    ASSESSMENT:  CLINICAL IMPRESSION: Pt enters ambulating with SPC, stated he has had it for a while but does not using it. He is mote stable when ambulating with SPC. He remains very unstable with gait and all standing activities. MD told him this is due to his age and co morbidities.  He goes recover on his own after becoming unsteady  most of the time. Increase fatigue with activity.  Reports no falls.   EVAL: Patient is a 78 y.o. M who was seen today for physical therapy evaluation and treatment for Free Text Diagnosis R29.6, R26.89, R26.9 Mult. Falls, Poor Balance, Gait Disturbance. Very pleasant and cooperative but very talkative during eval which did limit assessment time. Still having a lot of pain in general since his fall  and ongoing numbness in R UE/LE. Gait mechanics and balance are definitely impaired, would benefit from training with appropriate AD if he is open to this. Will benefit from skilled PT services to address his concerns and functional objective impairments to the best of our ability.   OBJECTIVE IMPAIRMENTS: Abnormal gait, cardiopulmonary status limiting activity, decreased activity tolerance, decreased balance, decreased mobility, and difficulty walking.   ACTIVITY LIMITATIONS: standing, squatting, stairs, transfers, reach over head, and locomotion level  PARTICIPATION LIMITATIONS: driving, shopping, community activity, and occupation  PERSONAL FACTORS: Age, Behavior pattern, Fitness, Past/current experiences, Social background, and Time since onset of injury/illness/exacerbation are also affecting patient's functional outcome.   REHAB POTENTIAL: Good  CLINICAL DECISION MAKING: Evolving/moderate complexity  EVALUATION COMPLEXITY: Moderate   GOALS: Goals reviewed with patient? No  SHORT TERM GOALS: Target date: 11/23/2023   Will be compliant with appropriate progressive HEP  Baseline: Goal status: MET 11/23/23  2.  Will be able to name 3 ways to prevent falls at home and in the community  Baseline:  Goal status: MET 11/23/23  3.  Will be able to perform 5xSTS and with distant S Baseline: MinA  Goal status: 12/13/23 MET   4.  Will complete TUG test in 12 seconds or less S  Baseline: MinA  Goal status: MET 11/20/23    LONG TERM GOALS: Target date: 02/01/2024      Will score at  least 18 on DGI to show improved functional balance  Baseline:  Goal status: ONGOING 12/13/23 DGI 11/24, 01/04/24 progressing  14/30, Ongoing 02/12/24  2.  Will be able to ambulate community distances (level and inclines) no device, RPE no more than 2/10  Baseline:  Goal status: ONGOING 12/13/23 (RPE 6/10), Ongoing 01/23/24 RPE 7/10, Ongoing 02/01/24, Ongoing 02/12/24  3.  Will be able to perform all desired gym activities without increased pain from resting levels  Baseline:  Goal status: ONGOING 12/13/23 improving, improving 01/23/24, Met 02/01/24  4.  ABC score to improve by at least 20 points to show improved subjective status  Baseline:  Goal status: ONGOING 12/13/23, ONGOING 01/23/24, Ongoing 02/12/24    PLAN:  PT FREQUENCY: 2x/week  PT DURATION: 4 weeks  PLANNED INTERVENTIONS: [id number]- PT Re-evaluation, 97110-Therapeutic exercises, 97530- Therapeutic activity, 97112- Neuromuscular re-education, 97535- Self Care, [id number]- Manual therapy, [id number]- Gait training, [phone]- Aquatic Therapy, and Balance training  PLAN FOR NEXT SESSION: balance and coordination progressions with compliant surfaces , functional activity tolerance.   Towanda Fret, PTA 02/19/24 11:42 AM

## 2024-02-20 DIAGNOSIS — Z8269 Family history of other diseases of the musculoskeletal system and connective tissue: Secondary | ICD-10-CM | POA: Diagnosis not present

## 2024-02-20 DIAGNOSIS — Z832 Family history of diseases of the blood and blood-forming organs and certain disorders involving the immune mechanism: Secondary | ICD-10-CM | POA: Diagnosis not present

## 2024-02-20 DIAGNOSIS — M069 Rheumatoid arthritis, unspecified: Secondary | ICD-10-CM | POA: Diagnosis not present

## 2024-02-20 DIAGNOSIS — Z23 Encounter for immunization: Secondary | ICD-10-CM | POA: Diagnosis not present

## 2024-02-21 ENCOUNTER — Ambulatory Visit: Admitting: Physical Therapy

## 2024-02-21 ENCOUNTER — Encounter: Payer: Self-pay | Admitting: Physical Therapy

## 2024-02-21 DIAGNOSIS — R262 Difficulty in walking, not elsewhere classified: Secondary | ICD-10-CM | POA: Diagnosis not present

## 2024-02-21 DIAGNOSIS — D329 Benign neoplasm of meninges, unspecified: Secondary | ICD-10-CM | POA: Diagnosis not present

## 2024-02-21 DIAGNOSIS — R531 Weakness: Secondary | ICD-10-CM | POA: Diagnosis not present

## 2024-02-21 DIAGNOSIS — R296 Repeated falls: Secondary | ICD-10-CM

## 2024-02-21 DIAGNOSIS — R2681 Unsteadiness on feet: Secondary | ICD-10-CM

## 2024-02-21 DIAGNOSIS — M6281 Muscle weakness (generalized): Secondary | ICD-10-CM | POA: Diagnosis not present

## 2024-02-21 NOTE — Therapy (Signed)
 OUTPATIENT PHYSICAL THERAPY LOWER EXTREMITY TREATMENT   Patient Name: Timothy Moran MRN: [id number] DOB:[birth year], 78 y.o., male Today's Date: 02/21/2024      END OF SESSION:  PT End of Session - 02/21/24 1143     Visit Number 27    Date for PT Re-Evaluation 03/13/24                       Past Medical History:  Diagnosis Date   Acute blood loss anemia 10/05/2020   Acute respiratory failure (HCC), hypothermia therapy, vent - extubated 10/06/20    AKI (acute kidney injury) (HCC) 10/05/2020   Anoxic brain injury (HCC) 10/26/2020   Arrhythmia    Ascending aortic aneurysm (HCC) 01/29/2018   a. 2019 4.3 cm by echo; b. 02/2021 Echo: Ao root 40mm.   Benign brain tumor (HCC)    Benign neoplasm of brain (HCC) 12/19/2017   Cardiac arrest with ventricular fibrillation (HCC)    Chest pain 12/04/2017   Chronic kidney disease, stage III (moderate) (HCC) 12/19/2017   CKD (chronic kidney disease), stage III - IV (HCC)    Conductive hearing loss of both ears 08/24/2018   Coronary Artery Disease    a. 09/2020 Inf STEMI complicated by cardiac arrest/PCI: LAD 50p/m, LCX 100d (2.5 x 30 Resolute Onyx DES); b. 04/2021 PCI: 04/2021 LM nl, LAD 50p/45m (2.5x30 Onyx Frontier DES), LCX 25p, 31m (RFR 0.98), 40d ISR (RFR 0.98), OM2 25, RCA mild diff dzs.   Diabetes mellitus, type 2 (HCC) 10/06/2020   10/06/20 A1C 7%   Dizziness 10/11/2019   Encounter for imaging study to confirm orogastric (OG) tube placement    Eustachian tube dysfunction 08/24/2018   Hematuria 10/26/2020   HFrEF (heart failure with reduced ejection fraction) (HCC)    a. 11/2020 Echo: EF 25-30%; b. 02/2021 Echo: EF 25-30%, glob HK. Mild LVH. Nl RV fxn. Triv AI. Ao root 40mm.   History of pulmonary embolus (PE)    HLD (hyperlipidemia) 10/26/2020   Hypertension    Iron deficiency anemia rec'd IV iron 10/26/2020   Ischemic cardiomyopathy    a. 11/2020 Echo: EF 25-30%; b. 02/2021 Echo: EF 25-30%, glob HK.   Left bundle branch  block 12/19/2017   Meningioma (HCC) 08/14/2020   Nonsustained ventricular tachycardia (HCC) 10/11/2019   Pain of left hip joint 05/29/2020   Personal history of pulmonary embolism 12/19/2017   Pneumonia of both lungs due to infectious organism    Pulmonary hypertension due to thromboembolism (HCC) 03/09/2018   See echo 12/27/17 with nl PAS vs CTa chest 02/23/18    S/P angioplasty with stent 10/04/20 DES to LCX  10/26/2020   Sinus bradycardia 12/22/2017   SOB (shortness of breath)    Solitary pulmonary nodule on lung CT 03/08/2018   CT 12/04/17 1.0 x 0.8 x 0.7 cm nodular opacity in the RUL vs not seen 03/16/10 posterior segment of the right upper lobe. Aaron AasSpirometry 03/08/2018    FEV1 3.86 (108%)  Ratio 96 s prior rx  - PET  03/13/18   Low grade c/w adenoca > rec T surgery eval     STEMI (ST elevation myocardial infarction) (HCC) 10/04/2020   Urinary retention 10/26/2020   Vestibular schwannoma (HCC) 08/14/2020   Past Surgical History:  Procedure Laterality Date   APPENDECTOMY     BIV ICD INSERTION CRT-D N/A 11/03/2021   Procedure: BIV ICD INSERTION CRT-D;  Surgeon: Lei Pump, MD;  Location: [facility] INVASIVE CV LAB;  Service: Cardiovascular;  Laterality: N/A;  CARDIAC CATHETERIZATION     CORONARY ANGIOPLASTY WITH STENT PLACEMENT Left 04/12/2021   LAD stent placement   CORONARY PRESSURE/FFR STUDY N/A 04/12/2021   Procedure: INTRAVASCULAR PRESSURE WIRE/FFR STUDY;  Surgeon: Sammy Crisp, MD;  Location: [facility] INVASIVE CV LAB;  Service: Cardiovascular;  Laterality: N/A;   CORONARY STENT INTERVENTION N/A 04/12/2021   Procedure: CORONARY STENT INTERVENTION;  Surgeon: Sammy Crisp, MD;  Location: [facility] INVASIVE CV LAB;  Service: Cardiovascular;  Laterality: N/A;   CORONARY ULTRASOUND/IVUS N/A 04/12/2021   Procedure: Intravascular Ultrasound/IVUS;  Surgeon: Sammy Crisp, MD;  Location: [facility] INVASIVE CV LAB;  Service: Cardiovascular;  Laterality: N/A;   CORONARY/GRAFT ACUTE MI REVASCULARIZATION N/A  10/04/2020   Procedure: Coronary/Graft Acute MI Revascularization;  Surgeon: Sammy Crisp, MD;  Location: [facility] INVASIVE CV LAB;  Service: Cardiovascular;  Laterality: N/A;   HERNIA REPAIR     LEFT HEART CATH AND CORONARY ANGIOGRAPHY N/A 10/04/2020   Procedure: LEFT HEART CATH AND CORONARY ANGIOGRAPHY;  Surgeon: Sammy Crisp, MD;  Location: [facility] INVASIVE CV LAB;  Service: Cardiovascular;  Laterality: N/A;   RIGHT/LEFT HEART CATH AND CORONARY ANGIOGRAPHY N/A 04/12/2021   Procedure: RIGHT/LEFT HEART CATH AND CORONARY ANGIOGRAPHY;  Surgeon: Sammy Crisp, MD;  Location: [facility] INVASIVE CV LAB;  Service: Cardiovascular;  Laterality: N/A;   Patient Active Problem List   Diagnosis Date Noted   Anemia    Leukocytosis    Right arm weakness 01/25/2022   Right sided weakness    Fall at home 01/22/2022   Status post biventricular pacemaker - MRI compatible PPM and leads 01/22/2022   Chronic indwelling Foley catheter 01/22/2022   CKD (chronic kidney disease), stage III - IV (HCC) 06/28/2021   Chronic HFrEF (heart failure with reduced ejection fraction) (HCC) 03/31/2021   Arrhythmia    DM (diabetes mellitus), type 2 (HCC) new 10/26/2020   Urinary retention 10/26/2020   Hyperlipidemia LDL goal <70 10/26/2020   Coronary artery disease of native artery of native heart with stable angina pectoris (HCC) 10/26/2020   S/P angioplasty with stent 10/04/20 DES to LCX  10/26/2020   Ischemic cardiomyopathy 10/26/2020   Diabetes mellitus, type 2 (HCC) 10/06/2020   DOE (dyspnea on exertion)    Meningioma (HCC) 08/14/2020   Vestibular schwannoma (HCC) 08/14/2020   Cardiac arrest with ventricular fibrillation (HCC)    Pain of left hip joint 05/29/2020   Benign brain tumor (HCC)    Chronic kidney disease    History of pulmonary embolus (PE)    Hypertension    Nonsustained ventricular tachycardia (HCC) 10/11/2019   Dizziness 10/11/2019   Abnormal echocardiogram 06/07/2019   Eustachian tube dysfunction  08/24/2018   Conductive hearing loss of both ears 08/24/2018   Pulmonary hypertension due to thromboembolism (HCC) 03/09/2018   Solitary pulmonary nodule on lung CT 03/08/2018   Ascending aortic aneurysm (HCC) 4.2 cm based on CT from December 2020 01/29/2018   Sinus bradycardia 12/22/2017   Left bundle branch block 12/19/2017   Personal history of pulmonary embolism - no longer on anticoagulation due to pt refusal to take it anymore. 12/19/2017   Chronic kidney disease, stage III (moderate) (HCC) 12/19/2017   Benign neoplasm of brain (HCC) 12/19/2017    PCP: Helyn Lobstein, MD  REFERRING PROVIDER: Helyn Lobstein, MD  REFERRING DIAG: Free Text Diagnosis R29.6, R26.89, R26.9 Mult. Falls, Poor Balance, Gait Disturbance  THERAPY DIAG:  Unsteadiness on feet  Difficulty in walking, not elsewhere classified  Meningioma (HCC)  Repeated falls  Muscle weakness (generalized)  Rationale for Evaluation and  Treatment: Rehabilitation  ONSET DATE: chronic   SUBJECTIVE:   SUBJECTIVE STATEMENT: No too bad, about the same   EVAL: Having issues with balance and walking. I do have a meningioma in my spine, and every once in awhile I have pain if I twist or stress it. Still having pain in my neck and hip from a fall that I had about a year and a half ago. Thought that pain would go away but it hasn't even this far out/being this healed. I have a lot of unsteadiness, not sure I'd call it dizziness but I have to stand for a minute before I get going. Have a couple of walking sticks/canes but not sure how to use them honestly. Entire R LE and UE are  still numb. When I fell I was at a friend's house and were leaving, about an hour before that I had a glass of wine, I went out first and held the door for the next people but didn't realize that the concrete platform ended and dropped about 15 inches and ended up stepping off that 15 inch ledge and hit my head/shoulder/hip/knee on the wall beyond that.  Meningioma is still just being monitored, saw the MD and they said its not growing but its just there in the spine. Every single movement I make with my neck hurts when I get to the end of that range.   PERTINENT HISTORY: See above- complex medical hx  PAIN:  Are you having pain? 2/10 R knee with movement   PRECAUTIONS: Fall and Other: caution with movements, some movements can cause increased pressure/pain at meningioma site, ICD    RED FLAGS: None   WEIGHT BEARING RESTRICTIONS: No  FALLS:  Has patient fallen in last 6 months? Yes. Number of falls 1- had a mis-step coming off a ladder, fell backwards and hit the floor pretty hard  LIVING ENVIRONMENT: Lives with: lives with their spouse Lives in: House/apartment Stairs: 5 STE with B rails, has second floor in his home but holds rail    OCCUPATION: retired   PLOF: Independent, Independent with basic ADLs, Independent with gait, and Independent with transfers  PATIENT GOALS: getting more active, improving balance   NEXT MD VISIT: Referring scheduled but unsure when   OBJECTIVE:  Note: Objective measures were completed at Evaluation unless otherwise noted.    PATIENT SURVEYS:  ABC scale 65.6%; 12/13/23 65%  COGNITION: Overall cognitive status: Within functional limits for tasks assessed     SENSATION: Complete R UE and R LE numbness (chronic)    COORDINATION  Noted some ataxia in R LE with functional movements   LOWER EXTREMITY MMT:  MMT Right eval Left eval  Hip flexion 4+ 5  Hip extension    Hip abduction 5 5  Hip adduction    Hip internal rotation    Hip external rotation    Knee flexion 5 5  Knee extension 5 5  Ankle dorsiflexion 5 5  Ankle plantarflexion    Ankle inversion    Ankle eversion     (Blank rows = not tested)   FUNCTIONAL TESTS:  5 times sit to stand: 15.4 seconds use of UEs, some difficulty stabilizing; 11/20/23 15.2 seconds use of UEs  Timed up and go (TUG): 14.2 seconds no  device but did need MinA for balance when initially standing and then turning to sit in the chair at end of test; 11/20/23 10 seconds  3 minute walk test: 624ft no device but needed MinA  at times for balance on corners, RPE 4/10; 12/13/23 586ft no device distant S, RPE 3/10     12/13/23 0001  02/12/24  Standardized Balance Assessment    Standardized Balance Assessment Dynamic Gait Index    Dynamic Gait Index    Level Surface 2 2 3   Change in Gait Speed 2 2 3   Gait with Horizontal Head Turns 2 2 2   Gait with Vertical Head Turns 2 2 1   Gait and Pivot Turn 1 1 1   Step Over Obstacle 1 1 1   Step Around Obstacles 1 2 2   Steps 0 2 2  Total Score 11 14 15       GAIT: Distance walked: 654ft Assistive device utilized: None Level of assistance: Min A Comments: MinA for balance with turns and when navigating obstacles, some ataxic movements noted                                                                                                                                 TREATMENT DATE:  02/21/24 Goals   ABC : 1010 / 1600 = 63.1 % NuStep L 5 x 5 min  Gait outside w/ SPC  Down slope on L side of back parking lot and back up three standing rest breaks Leg press 40lb 2x15 Seated Rows & Lats 25lb 2x10  02/19/24 NuStep L5 x 7 min HS curls 35lb 2x15 Leg Ext 10lb 2x15 S2S LE on airex elevated surface 2x10 Alt 6in box taps w/ SPC 2x10 Leg press 40lb 2x10  Stopped after 1 set due to R knee pain Shoulder Ext and rows 10lb 2x12   02/14/24 NuStep L5 x 6 min Resisted gait 4 way 30lb x 3 each  Seated rest needed between sets 4in step up x10 each some UE assit at times  HS curls 35lb 2x15 Leg Ext 10lb 2x10   02/12/24 NuStep L5 x 6 min Goals   ABC : 1050 / 1600 = 65.6 %  DGI 15  Gait outside around front L and front center [place] in back barking lot PRE 7/10 ~ 721ft 4in step up x10 each some UE assit at times  Shoulder Ext and rows 10lb 2x12   02/01/24 Bike L 3.5 x4 min NuStep L 5 x 3  min  Gait out the around the front back [place] into the front of the clinic. Up and down slope. Pt has a difficult time controlling velocity with down hill ambulation. Resisted gait 4 way 30lb x 4 each  4in step up x10 each some UE assit at times  Standing rows 2x10 35lb Heel raises 2x15   PATIENT EDUCATION:  Education details: exam findings, POC Person educated: Patient Education method: Explanation, Demonstration, and Verbal cues Education comprehension: verbalized understanding, returned demonstration, and needs further education  HOME EXERCISE PROGRAM:  Access Code: [email or web address] URL: [email or web address] Date: 12/21/2023 Prepared by: Terrel Ferries  Exercises - Wide Tandem Stance with Eyes Open  - 1  x daily - 7 x weekly - 2 sets - 3 reps - 30 seconds  hold - Romberg Stance  - 1 x daily - 7 x weekly - 2 sets - 3 reps - 30 second  hold - Single Leg Stance with Support  - 1 x daily - 7 x weekly - 2 sets - 3 reps - Supine Transversus Abdominis Bracing - Hands on Stomach  - 5 x daily - 7 x weekly - 1 sets - 10 reps - 3 seconds  hold - Supine Posterior Pelvic Tilt  - 2 x daily - 7 x weekly - 1 sets - 10 reps - 3 seconds  hold - Supine March  - 1 x daily - 7 x weekly - 3 sets - 10 reps    ASSESSMENT:  CLINICAL IMPRESSION: Pt enters ambulating with SPC.  SPC used with outdoor ambulation and pt appeared to be more stable and scored better on RPE scale. He is mote stable when ambulating with SPC.  Reports no falls. Not much change with ABC score. Cue for full ROM needed on leg press. Cues to maintain posture with  seated rows.   EVAL: Patient is a 78 y.o. M who was seen today for physical therapy evaluation and treatment for Free Text Diagnosis R29.6, R26.89, R26.9 Mult. Falls, Poor Balance, Gait Disturbance. Very pleasant and cooperative but very talkative during eval which did limit assessment time. Still having a lot of pain in general since his fall  and ongoing numbness  in R UE/LE. Gait mechanics and balance are definitely impaired, would benefit from training with appropriate AD if he is open to this. Will benefit from skilled PT services to address his concerns and functional objective impairments to the best of our ability.   OBJECTIVE IMPAIRMENTS: Abnormal gait, cardiopulmonary status limiting activity, decreased activity tolerance, decreased balance, decreased mobility, and difficulty walking.   ACTIVITY LIMITATIONS: standing, squatting, stairs, transfers, reach over head, and locomotion level  PARTICIPATION LIMITATIONS: driving, shopping, community activity, and occupation  PERSONAL FACTORS: Age, Behavior pattern, Fitness, Past/current experiences, Social background, and Time since onset of injury/illness/exacerbation are also affecting patient's functional outcome.   REHAB POTENTIAL: Good  CLINICAL DECISION MAKING: Evolving/moderate complexity  EVALUATION COMPLEXITY: Moderate   GOALS: Goals reviewed with patient? No  SHORT TERM GOALS: Target date: 11/23/2023   Will be compliant with appropriate progressive HEP  Baseline: Goal status: MET 11/23/23  2.  Will be able to name 3 ways to prevent falls at home and in the community  Baseline:  Goal status: MET 11/23/23  3.  Will be able to perform 5xSTS and with distant S Baseline: MinA  Goal status: 12/13/23 MET   4.  Will complete TUG test in 12 seconds or less S  Baseline: MinA  Goal status: MET 11/20/23    LONG TERM GOALS: Target date: 02/01/2024      Will score at least 18 on DGI to show improved functional balance  Baseline:  Goal status: ONGOING 12/13/23 DGI 11/24, 01/04/24 progressing  14/30, Ongoing 02/12/24  2.  Will be able to ambulate community distances (level and inclines) no device, RPE no more than 2/10  Baseline:  Goal status: ONGOING 12/13/23 (RPE 6/10), Ongoing 01/23/24 RPE 7/10, Ongoing 02/01/24, Ongoing 02/12/24, Progressing 5/10 02/21/24  3.  Will be able to perform all  desired gym activities without increased pain from resting levels  Baseline:  Goal status: ONGOING 12/13/23 improving, improving 01/23/24, Met 02/01/24  4.  ABC score  to improve by at least 20 points to show improved subjective status  Baseline:  Goal status: ONGOING 12/13/23, ONGOING 01/23/24, Ongoing 02/12/24    PLAN:  PT FREQUENCY: 2x/week  PT DURATION: 4 weeks  PLANNED INTERVENTIONS: [id number]- PT Re-evaluation, 97110-Therapeutic exercises, 97530- Therapeutic activity, 97112- Neuromuscular re-education, 97535- Self Care, [id number]- Manual therapy, [id number]- Gait training, [phone]- Aquatic Therapy, and Balance training  PLAN FOR NEXT SESSION: balance and coordination progressions with compliant surfaces , functional activity tolerance.   Towanda Fret, PTA 02/21/24 11:44 AM

## 2024-02-29 ENCOUNTER — Other Ambulatory Visit: Payer: Self-pay | Admitting: Cardiology

## 2024-02-29 DIAGNOSIS — Z1379 Encounter for other screening for genetic and chromosomal anomalies: Secondary | ICD-10-CM | POA: Diagnosis not present

## 2024-02-29 DIAGNOSIS — Z832 Family history of diseases of the blood and blood-forming organs and certain disorders involving the immune mechanism: Secondary | ICD-10-CM | POA: Diagnosis not present

## 2024-02-29 DIAGNOSIS — M069 Rheumatoid arthritis, unspecified: Secondary | ICD-10-CM | POA: Diagnosis not present

## 2024-02-29 DIAGNOSIS — Z8269 Family history of other diseases of the musculoskeletal system and connective tissue: Secondary | ICD-10-CM | POA: Diagnosis not present

## 2024-03-04 ENCOUNTER — Encounter: Payer: Self-pay | Admitting: Physical Therapy

## 2024-03-04 ENCOUNTER — Ambulatory Visit: Admitting: Physical Therapy

## 2024-03-04 DIAGNOSIS — R531 Weakness: Secondary | ICD-10-CM | POA: Diagnosis not present

## 2024-03-04 DIAGNOSIS — R296 Repeated falls: Secondary | ICD-10-CM | POA: Diagnosis not present

## 2024-03-04 DIAGNOSIS — R262 Difficulty in walking, not elsewhere classified: Secondary | ICD-10-CM

## 2024-03-04 DIAGNOSIS — M6281 Muscle weakness (generalized): Secondary | ICD-10-CM

## 2024-03-04 DIAGNOSIS — R2681 Unsteadiness on feet: Secondary | ICD-10-CM

## 2024-03-04 DIAGNOSIS — D329 Benign neoplasm of meninges, unspecified: Secondary | ICD-10-CM | POA: Diagnosis not present

## 2024-03-04 NOTE — Therapy (Signed)
 OUTPATIENT PHYSICAL THERAPY LOWER EXTREMITY TREATMENT   Patient Name: Timothy Moran MRN: [id number] DOB:[birth year], 78 y.o., male Today's Date: 03/04/2024      END OF SESSION:  PT End of Session - 03/04/24 1303     Visit Number 28    Date for PT Re-Evaluation 03/13/24    PT Start Time 1300    PT Stop Time 1345    PT Time Calculation (min) 45 min    Activity Tolerance Patient tolerated treatment well    Behavior During Therapy [facility] for tasks assessed/performed                    Past Medical History:  Diagnosis Date   Acute blood loss anemia 10/05/2020   Acute respiratory failure (HCC), hypothermia therapy, vent - extubated 10/06/20    AKI (acute kidney injury) (HCC) 10/05/2020   Anoxic brain injury (HCC) 10/26/2020   Arrhythmia    Ascending aortic aneurysm (HCC) 01/29/2018   a. 2019 4.3 cm by echo; b. 02/2021 Echo: Ao root 40mm.   Benign brain tumor (HCC)    Benign neoplasm of brain (HCC) 12/19/2017   Cardiac arrest with ventricular fibrillation (HCC)    Chest pain 12/04/2017   Chronic kidney disease, stage III (moderate) (HCC) 12/19/2017   CKD (chronic kidney disease), stage III - IV (HCC)    Conductive hearing loss of both ears 08/24/2018   Coronary Artery Disease    a. 09/2020 Inf STEMI complicated by cardiac arrest/PCI: LAD 50p/m, LCX 100d (2.5 x 30 Resolute Onyx DES); b. 04/2021 PCI: 04/2021 LM nl, LAD 50p/19m (2.5x30 Onyx Frontier DES), LCX 25p, 65m (RFR 0.98), 40d ISR (RFR 0.98), OM2 25, RCA mild diff dzs.   Diabetes mellitus, type 2 (HCC) 10/06/2020   10/06/20 A1C 7%   Dizziness 10/11/2019   Encounter for imaging study to confirm orogastric (OG) tube placement    Eustachian tube dysfunction 08/24/2018   Hematuria 10/26/2020   HFrEF (heart failure with reduced ejection fraction) (HCC)    a. 11/2020 Echo: EF 25-30%; b. 02/2021 Echo: EF 25-30%, glob HK. Mild LVH. Nl RV fxn. Triv AI. Ao root 40mm.   History of pulmonary embolus (PE)    HLD (hyperlipidemia)  10/26/2020   Hypertension    Iron deficiency anemia rec'd IV iron 10/26/2020   Ischemic cardiomyopathy    a. 11/2020 Echo: EF 25-30%; b. 02/2021 Echo: EF 25-30%, glob HK.   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 SABRASpirometry 03/08/2018    FEV1 3.86 (108%)  Ratio 96 s prior rx  - PET  03/13/18   Low grade c/w adenoca > rec T surgery eval     STEMI (ST elevation myocardial infarction) (HCC) 10/04/2020   Urinary retention 10/26/2020   Vestibular schwannoma (HCC) 08/14/2020   Past Surgical History:  Procedure Laterality  Date   APPENDECTOMY     BIV ICD INSERTION CRT-D N/A 11/03/2021   Procedure: BIV ICD INSERTION CRT-D;  Surgeon: Inocencio Soyla Lunger, MD;  Location: [facility] INVASIVE CV LAB;  Service: Cardiovascular;  Laterality: N/A;   CARDIAC CATHETERIZATION     CORONARY ANGIOPLASTY WITH STENT PLACEMENT Left 04/12/2021   LAD stent placement   CORONARY PRESSURE/FFR STUDY N/A 04/12/2021   Procedure: INTRAVASCULAR PRESSURE WIRE/FFR STUDY;  Surgeon: Mady Bruckner, MD;  Location: [facility] INVASIVE CV LAB;  Service: Cardiovascular;  Laterality: N/A;   CORONARY STENT INTERVENTION N/A 04/12/2021   Procedure: CORONARY STENT INTERVENTION;  Surgeon: Mady Bruckner, MD;  Location: [facility] INVASIVE CV LAB;  Service: Cardiovascular;  Laterality: N/A;   CORONARY ULTRASOUND/IVUS N/A 04/12/2021    Procedure: Intravascular Ultrasound/IVUS;  Surgeon: Mady Bruckner, MD;  Location: [facility] INVASIVE CV LAB;  Service: Cardiovascular;  Laterality: N/A;   CORONARY/GRAFT ACUTE MI REVASCULARIZATION N/A 10/04/2020   Procedure: Coronary/Graft Acute MI Revascularization;  Surgeon: Mady Bruckner, MD;  Location: [facility] INVASIVE CV LAB;  Service: Cardiovascular;  Laterality: N/A;   HERNIA REPAIR     LEFT HEART CATH AND CORONARY ANGIOGRAPHY N/A 10/04/2020   Procedure: LEFT HEART CATH AND CORONARY ANGIOGRAPHY;  Surgeon: Mady Bruckner, MD;  Location: [facility] INVASIVE CV LAB;  Service: Cardiovascular;  Laterality: N/A;   RIGHT/LEFT HEART CATH AND CORONARY ANGIOGRAPHY N/A 04/12/2021   Procedure: RIGHT/LEFT HEART CATH AND CORONARY ANGIOGRAPHY;  Surgeon: Mady Bruckner, MD;  Location: [facility] INVASIVE CV LAB;  Service: Cardiovascular;  Laterality: N/A;   Patient Active Problem List   Diagnosis Date Noted   Anemia    Leukocytosis    Right arm weakness 01/25/2022   Right sided weakness    Fall at home 01/22/2022   Status post biventricular pacemaker - MRI compatible PPM and leads 01/22/2022   Chronic indwelling Foley catheter 01/22/2022   CKD (chronic kidney disease), stage III - IV (HCC) 06/28/2021   Chronic HFrEF (heart failure with reduced ejection fraction) (HCC) 03/31/2021   Arrhythmia    DM (diabetes mellitus), type 2 (HCC) new 10/26/2020   Urinary retention 10/26/2020   Hyperlipidemia LDL goal <70 10/26/2020   Coronary artery disease of native artery of native heart with stable angina pectoris (HCC) 10/26/2020   S/P angioplasty with stent 10/04/20 DES to LCX  10/26/2020   Ischemic cardiomyopathy 10/26/2020   Diabetes mellitus, type 2 (HCC) 10/06/2020   DOE (dyspnea on exertion)    Meningioma (HCC) 08/14/2020   Vestibular schwannoma (HCC) 08/14/2020   Cardiac arrest with ventricular fibrillation (HCC)    Pain of left hip joint 05/29/2020   Benign brain tumor (HCC)    Chronic kidney disease    History of  pulmonary embolus (PE)    Hypertension    Nonsustained ventricular tachycardia (HCC) 10/11/2019   Dizziness 10/11/2019   Abnormal echocardiogram 06/07/2019   Eustachian tube dysfunction 08/24/2018   Conductive hearing loss of both ears 08/24/2018   Pulmonary hypertension due to thromboembolism (HCC) 03/09/2018   Solitary pulmonary nodule on lung CT 03/08/2018   Ascending aortic aneurysm (HCC) 4.2 cm based on CT from December 2020 01/29/2018   Sinus bradycardia 12/22/2017   Left bundle branch block 12/19/2017   Personal history of pulmonary embolism - no longer on anticoagulation due to pt refusal to take it anymore. 12/19/2017   Chronic kidney disease, stage III (moderate) (HCC) 12/19/2017   Benign neoplasm of brain (HCC) 12/19/2017    PCP: Aisha Harvey, MD  REFERRING PROVIDER: Aisha Harvey, MD  REFERRING DIAG:  Free Text Diagnosis R29.6, R26.89, R26.9 Mult. Falls, Poor Balance, Gait Disturbance  THERAPY DIAG:  Unsteadiness on feet  Difficulty in walking, not elsewhere classified  Meningioma (HCC)  Repeated falls  Muscle weakness (generalized)  Rationale for Evaluation and Treatment: Rehabilitation  ONSET DATE: chronic   SUBJECTIVE:   SUBJECTIVE STATEMENT: No fall, not getting any beter   EVAL: Having issues with balance and walking. I do have a meningioma in my spine, and every once in awhile I have pain if I twist or stress it. Still having pain in my neck and hip from a fall that I had about a year and a half ago. Thought that pain would go away but it hasn't even this far out/being this healed. I have a lot of unsteadiness, not sure I'd call it dizziness but I have to stand for a minute before I get going. Have a couple of walking sticks/canes but not sure how to use them honestly. Entire R LE and UE are  still numb. When I fell I was at a friend's house and were leaving, about an hour before that I had a glass of wine, I went out first and held the door for the  next people but didn't realize that the concrete platform ended and dropped about 15 inches and ended up stepping off that 15 inch ledge and hit my head/shoulder/hip/knee on the wall beyond that. Meningioma is still just being monitored, saw the MD and they said its not growing but its just there in the spine. Every single movement I make with my neck hurts when I get to the end of that range.   PERTINENT HISTORY: See above- complex medical hx  PAIN:  Are you having pain? 2/10 R knee with movement   PRECAUTIONS: Fall and Other: caution with movements, some movements can cause increased pressure/pain at meningioma site, ICD    RED FLAGS: None   WEIGHT BEARING RESTRICTIONS: No  FALLS:  Has patient fallen in last 6 months? Yes. Number of falls 1- had a mis-step coming off a ladder, fell backwards and hit the floor pretty hard  LIVING ENVIRONMENT: Lives with: lives with their spouse Lives in: House/apartment Stairs: 5 STE with B rails, has second floor in his home but holds rail    OCCUPATION: retired   PLOF: Independent, Independent with basic ADLs, Independent with gait, and Independent with transfers  PATIENT GOALS: getting more active, improving balance   NEXT MD VISIT: Referring scheduled but unsure when   OBJECTIVE:  Note: Objective measures were completed at Evaluation unless otherwise noted.    PATIENT SURVEYS:  ABC scale 65.6%; 12/13/23 65%  COGNITION: Overall cognitive status: Within functional limits for tasks assessed     SENSATION: Complete R UE and R LE numbness (chronic)    COORDINATION  Noted some ataxia in R LE with functional movements   LOWER EXTREMITY MMT:  MMT Right eval Left eval  Hip flexion 4+ 5  Hip extension    Hip abduction 5 5  Hip adduction    Hip internal rotation    Hip external rotation    Knee flexion 5 5  Knee extension 5 5  Ankle dorsiflexion 5 5  Ankle plantarflexion    Ankle inversion    Ankle eversion     (Blank  rows = not tested)   FUNCTIONAL TESTS:  5 times sit to stand: 15.4 seconds use of UEs, some difficulty stabilizing; 11/20/23 15.2 seconds use of UEs  Timed up and  go (TUG): 14.2 seconds no device but did need MinA for balance when initially standing and then turning to sit in the chair at end of test; 11/20/23 10 seconds  3 minute walk test: 692ft no device but needed MinA at times for balance on corners, RPE 4/10; 12/13/23 530ft no device distant S, RPE 3/10     12/13/23 0001  02/12/24  Standardized Balance Assessment    Standardized Balance Assessment Dynamic Gait Index    Dynamic Gait Index    Level Surface 2 2 3   Change in Gait Speed 2 2 3   Gait with Horizontal Head Turns 2 2 2   Gait with Vertical Head Turns 2 2 1   Gait and Pivot Turn 1 1 1   Step Over Obstacle 1 1 1   Step Around Obstacles 1 2 2   Steps 0 2 2  Total Score 11 14 15       GAIT: Distance walked: 624ft Assistive device utilized: None Level of assistance: Min A Comments: MinA for balance with turns and when navigating obstacles, some ataxic movements noted                                                                                                                                 TREATMENT DATE:  03/04/24 NuStep L 5 L5 x 6 min Resisted side steps 30lb x5 each HS curls 35lb 3x15 Leg Ext 15lb 2x15 Sit to stand from elevated surface LE nn mat x10, x5 Shoulder Ext 10lb 2x10  02/21/24 Goals   ABC : 1010 / 1600 = 63.1 % NuStep L 5 x 5 min  Gait outside w/ SPC  Down slope on L side of back parking lot and back up three standing rest breaks Leg press 40lb 2x15 Seated Rows & Lats 25lb 2x10  02/19/24 NuStep L5 x 7 min HS curls 35lb 2x15 Leg Ext 10lb 2x15 S2S LE on airex elevated surface 2x10 Alt 6in box taps w/ SPC 2x10 Leg press 40lb 2x10  Stopped after 1 set due to R knee pain Shoulder Ext and rows 10lb 2x12   02/14/24 NuStep L5 x 6 min Resisted gait 4 way 30lb x 3 each  Seated rest needed between  sets 4in step up x10 each some UE assit at times  HS curls 35lb 2x15 Leg Ext 10lb 2x10   02/12/24 NuStep L5 x 6 min Goals   ABC : 1050 / 1600 = 65.6 %  DGI 15  Gait outside around front L and front [place] in back barking lot PRE 7/10 ~ 722ft 4in step up x10 each some UE assit at times  Shoulder Ext and rows 10lb 2x12   02/01/24 Bike L 3.5 x4 min NuStep L 5 x 3 min  Gait out the around the front back [place] into the front of the clinic. Up and down slope. Pt has a difficult time controlling velocity with down hill ambulation. Resisted gait 4 way 30lb x  4 each  4in step up x10 each some UE assit at times  Standing rows 2x10 35lb Heel raises 2x15   PATIENT EDUCATION:  Education details: exam findings, POC Person educated: Patient Education method: Explanation, Demonstration, and Verbal cues Education comprehension: verbalized understanding, returned demonstration, and needs further education  HOME EXERCISE PROGRAM:  Access Code: [email or web address] URL: [email or web address] Date: 12/21/2023 Prepared by: Josette Rough  Exercises - Wide Tandem Stance with Eyes Open  - 1 x daily - 7 x weekly - 2 sets - 3 reps - 30 seconds  hold - Romberg Stance  - 1 x daily - 7 x weekly - 2 sets - 3 reps - 30 second  hold - Single Leg Stance with Support  - 1 x daily - 7 x weekly - 2 sets - 3 reps - Supine Transversus Abdominis Bracing - Hands on Stomach  - 5 x daily - 7 x weekly - 1 sets - 10 reps - 3 seconds  hold - Supine Posterior Pelvic Tilt  - 2 x daily - 7 x weekly - 1 sets - 10 reps - 3 seconds  hold - Supine March  - 1 x daily - 7 x weekly - 3 sets - 10 reps    ASSESSMENT:  CLINICAL IMPRESSION: Pt enters ambulating without AD, again he Reports no falls. Cues to maintain posture with shoulder Ext. Pt remains very unstable at times with gait and activities. Difficult time with alt box taps. Unable to complete shoulder Ext while standing on airex. Will D/c next  week   EVAL: Patient is a 78 y.o. M who was seen today for physical therapy evaluation and treatment for Free Text Diagnosis R29.6, R26.89, R26.9 Mult. Falls, Poor Balance, Gait Disturbance. Very pleasant and cooperative but very talkative during eval which did limit assessment time. Still having a lot of pain in general since his fall  and ongoing numbness in R UE/LE. Gait mechanics and balance are definitely impaired, would benefit from training with appropriate AD if he is open to this. Will benefit from skilled PT services to address his concerns and functional objective impairments to the best of our ability.   OBJECTIVE IMPAIRMENTS: Abnormal gait, cardiopulmonary status limiting activity, decreased activity tolerance, decreased balance, decreased mobility, and difficulty walking.   ACTIVITY LIMITATIONS: standing, squatting, stairs, transfers, reach over head, and locomotion level  PARTICIPATION LIMITATIONS: driving, shopping, community activity, and occupation  PERSONAL FACTORS: Age, Behavior pattern, Fitness, Past/current experiences, Social background, and Time since onset of injury/illness/exacerbation are also affecting patient's functional outcome.   REHAB POTENTIAL: Good  CLINICAL DECISION MAKING: Evolving/moderate complexity  EVALUATION COMPLEXITY: Moderate   GOALS: Goals reviewed with patient? No  SHORT TERM GOALS: Target date: 11/23/2023   Will be compliant with appropriate progressive HEP  Baseline: Goal status: MET 11/23/23  2.  Will be able to name 3 ways to prevent falls at home and in the community  Baseline:  Goal status: MET 11/23/23  3.  Will be able to perform 5xSTS and with distant S Baseline: MinA  Goal status: 12/13/23 MET   4.  Will complete TUG test in 12 seconds or less S  Baseline: MinA  Goal status: MET 11/20/23    LONG TERM GOALS: Target date: 02/01/2024      Will score at least 18 on DGI to show improved functional balance  Baseline:   Goal status: ONGOING 12/13/23 DGI 11/24, 01/04/24 progressing  14/30, Ongoing 02/12/24  2.  Will be able to ambulate  community distances (level and inclines) no device, RPE no more than 2/10  Baseline:  Goal status: ONGOING 12/13/23 (RPE 6/10), Ongoing 01/23/24 RPE 7/10, Ongoing 02/01/24, Ongoing 02/12/24, Progressing 5/10 02/21/24  3.  Will be able to perform all desired gym activities without increased pain from resting levels  Baseline:  Goal status: ONGOING 12/13/23 improving, improving 01/23/24, Met 02/01/24  4.  ABC score to improve by at least 20 points to show improved subjective status  Baseline:  Goal status: ONGOING 12/13/23, ONGOING 01/23/24, Ongoing 02/12/24    PLAN:  PT FREQUENCY: 2x/week  PT DURATION: 4 weeks  PLANNED INTERVENTIONS: [id number]- PT Re-evaluation, 97110-Therapeutic exercises, 97530- Therapeutic activity, 97112- Neuromuscular re-education, 97535- Self Care, [id number]- Manual therapy, [id number]- Gait training, [phone]- Aquatic Therapy, and Balance training  PLAN FOR NEXT SESSION: balance and coordination progressions with compliant surfaces , functional activity tolerance. D/C next week  Tanda Sorrow, PTA 03/04/24 1:04 PM

## 2024-03-06 ENCOUNTER — Encounter: Payer: Self-pay | Admitting: Physical Therapy

## 2024-03-06 ENCOUNTER — Ambulatory Visit: Admitting: Physical Therapy

## 2024-03-06 DIAGNOSIS — R262 Difficulty in walking, not elsewhere classified: Secondary | ICD-10-CM | POA: Diagnosis not present

## 2024-03-06 DIAGNOSIS — M6281 Muscle weakness (generalized): Secondary | ICD-10-CM | POA: Diagnosis not present

## 2024-03-06 DIAGNOSIS — R531 Weakness: Secondary | ICD-10-CM | POA: Diagnosis not present

## 2024-03-06 DIAGNOSIS — R2681 Unsteadiness on feet: Secondary | ICD-10-CM | POA: Diagnosis not present

## 2024-03-06 DIAGNOSIS — R296 Repeated falls: Secondary | ICD-10-CM

## 2024-03-06 DIAGNOSIS — D329 Benign neoplasm of meninges, unspecified: Secondary | ICD-10-CM

## 2024-03-06 NOTE — Therapy (Signed)
 OUTPATIENT PHYSICAL THERAPY LOWER EXTREMITY TREATMENT   Patient Name: Timothy Moran MRN: [id number] DOB:[birth year], 78 y.o., male Today's Date: 03/06/2024      END OF SESSION:  PT End of Session - 03/06/24 1144     Visit Number 29    Date for PT Re-Evaluation 03/13/24    PT Start Time 1145    PT Stop Time 1230    PT Time Calculation (min) 45 min    Activity Tolerance Patient tolerated treatment well    Behavior During Therapy [facility] for tasks assessed/performed                    Past Medical History:  Diagnosis Date   Acute blood loss anemia 10/05/2020   Acute respiratory failure (HCC), hypothermia therapy, vent - extubated 10/06/20    AKI (acute kidney injury) (HCC) 10/05/2020   Anoxic brain injury (HCC) 10/26/2020   Arrhythmia    Ascending aortic aneurysm (HCC) 01/29/2018   a. 2019 4.3 cm by echo; b. 02/2021 Echo: Ao root 40mm.   Benign brain tumor (HCC)    Benign neoplasm of brain (HCC) 12/19/2017   Cardiac arrest with ventricular fibrillation (HCC)    Chest pain 12/04/2017   Chronic kidney disease, stage III (moderate) (HCC) 12/19/2017   CKD (chronic kidney disease), stage III - IV (HCC)    Conductive hearing loss of both ears 08/24/2018   Coronary Artery Disease    a. 09/2020 Inf STEMI complicated by cardiac arrest/PCI: LAD 50p/m, LCX 100d (2.5 x 30 Resolute Onyx DES); b. 04/2021 PCI: 04/2021 LM nl, LAD 50p/79m (2.5x30 Onyx Frontier DES), LCX 25p, 57m (RFR 0.98), 40d ISR (RFR 0.98), OM2 25, RCA mild diff dzs.   Diabetes mellitus, type 2 (HCC) 10/06/2020   10/06/20 A1C 7%   Dizziness 10/11/2019   Encounter for imaging study to confirm orogastric (OG) tube placement    Eustachian tube dysfunction 08/24/2018   Hematuria 10/26/2020   HFrEF (heart failure with reduced ejection fraction) (HCC)    a. 11/2020 Echo: EF 25-30%; b. 02/2021 Echo: EF 25-30%, glob HK. Mild LVH. Nl RV fxn. Triv AI. Ao root 40mm.   History of pulmonary embolus (PE)    HLD (hyperlipidemia)  10/26/2020   Hypertension    Iron deficiency anemia rec'd IV iron 10/26/2020   Ischemic cardiomyopathy    a. 11/2020 Echo: EF 25-30%; b. 02/2021 Echo: EF 25-30%, glob HK.   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 SABRASpirometry 03/08/2018    FEV1 3.86 (108%)  Ratio 96 s prior rx  - PET  03/13/18   Low grade c/w adenoca > rec T surgery eval     STEMI (ST elevation myocardial infarction) (HCC) 10/04/2020   Urinary retention 10/26/2020   Vestibular schwannoma (HCC) 08/14/2020   Past Surgical History:  Procedure Laterality  Date   APPENDECTOMY     BIV ICD INSERTION CRT-D N/A 11/03/2021   Procedure: BIV ICD INSERTION CRT-D;  Surgeon: Inocencio Soyla Lunger, MD;  Location: [facility] INVASIVE CV LAB;  Service: Cardiovascular;  Laterality: N/A;   CARDIAC CATHETERIZATION     CORONARY ANGIOPLASTY WITH STENT PLACEMENT Left 04/12/2021   LAD stent placement   CORONARY PRESSURE/FFR STUDY N/A 04/12/2021   Procedure: INTRAVASCULAR PRESSURE WIRE/FFR STUDY;  Surgeon: Mady Bruckner, MD;  Location: [facility] INVASIVE CV LAB;  Service: Cardiovascular;  Laterality: N/A;   CORONARY STENT INTERVENTION N/A 04/12/2021   Procedure: CORONARY STENT INTERVENTION;  Surgeon: Mady Bruckner, MD;  Location: [facility] INVASIVE CV LAB;  Service: Cardiovascular;  Laterality: N/A;   CORONARY ULTRASOUND/IVUS N/A 04/12/2021    Procedure: Intravascular Ultrasound/IVUS;  Surgeon: Mady Bruckner, MD;  Location: [facility] INVASIVE CV LAB;  Service: Cardiovascular;  Laterality: N/A;   CORONARY/GRAFT ACUTE MI REVASCULARIZATION N/A 10/04/2020   Procedure: Coronary/Graft Acute MI Revascularization;  Surgeon: Mady Bruckner, MD;  Location: [facility] INVASIVE CV LAB;  Service: Cardiovascular;  Laterality: N/A;   HERNIA REPAIR     LEFT HEART CATH AND CORONARY ANGIOGRAPHY N/A 10/04/2020   Procedure: LEFT HEART CATH AND CORONARY ANGIOGRAPHY;  Surgeon: Mady Bruckner, MD;  Location: [facility] INVASIVE CV LAB;  Service: Cardiovascular;  Laterality: N/A;   RIGHT/LEFT HEART CATH AND CORONARY ANGIOGRAPHY N/A 04/12/2021   Procedure: RIGHT/LEFT HEART CATH AND CORONARY ANGIOGRAPHY;  Surgeon: Mady Bruckner, MD;  Location: [facility] INVASIVE CV LAB;  Service: Cardiovascular;  Laterality: N/A;   Patient Active Problem List   Diagnosis Date Noted   Anemia    Leukocytosis    Right arm weakness 01/25/2022   Right sided weakness    Fall at home 01/22/2022   Status post biventricular pacemaker - MRI compatible PPM and leads 01/22/2022   Chronic indwelling Foley catheter 01/22/2022   CKD (chronic kidney disease), stage III - IV (HCC) 06/28/2021   Chronic HFrEF (heart failure with reduced ejection fraction) (HCC) 03/31/2021   Arrhythmia    DM (diabetes mellitus), type 2 (HCC) new 10/26/2020   Urinary retention 10/26/2020   Hyperlipidemia LDL goal <70 10/26/2020   Coronary artery disease of native artery of native heart with stable angina pectoris (HCC) 10/26/2020   S/P angioplasty with stent 10/04/20 DES to LCX  10/26/2020   Ischemic cardiomyopathy 10/26/2020   Diabetes mellitus, type 2 (HCC) 10/06/2020   DOE (dyspnea on exertion)    Meningioma (HCC) 08/14/2020   Vestibular schwannoma (HCC) 08/14/2020   Cardiac arrest with ventricular fibrillation (HCC)    Pain of left hip joint 05/29/2020   Benign brain tumor (HCC)    Chronic kidney disease    History of  pulmonary embolus (PE)    Hypertension    Nonsustained ventricular tachycardia (HCC) 10/11/2019   Dizziness 10/11/2019   Abnormal echocardiogram 06/07/2019   Eustachian tube dysfunction 08/24/2018   Conductive hearing loss of both ears 08/24/2018   Pulmonary hypertension due to thromboembolism (HCC) 03/09/2018   Solitary pulmonary nodule on lung CT 03/08/2018   Ascending aortic aneurysm (HCC) 4.2 cm based on CT from December 2020 01/29/2018   Sinus bradycardia 12/22/2017   Left bundle branch block 12/19/2017   Personal history of pulmonary embolism - no longer on anticoagulation due to pt refusal to take it anymore. 12/19/2017   Chronic kidney disease, stage III (moderate) (HCC) 12/19/2017   Benign neoplasm of brain (HCC) 12/19/2017    PCP: Aisha Harvey, MD  REFERRING PROVIDER: Aisha Harvey, MD  REFERRING DIAG:  Free Text Diagnosis R29.6, R26.89, R26.9 Mult. Falls, Poor Balance, Gait Disturbance  THERAPY DIAG:  Unsteadiness on feet  Difficulty in walking, not elsewhere classified  Meningioma (HCC)  Repeated falls  Muscle weakness (generalized)  Rationale for Evaluation and Treatment: Rehabilitation  ONSET DATE: chronic   SUBJECTIVE:   SUBJECTIVE STATEMENT: About the same   EVAL: Having issues with balance and walking. I do have a meningioma in my spine, and every once in awhile I have pain if I twist or stress it. Still having pain in my neck and hip from a fall that I had about a year and a half ago. Thought that pain would go away but it hasn't even this far out/being this healed. I have a lot of unsteadiness, not sure I'd call it dizziness but I have to stand for a minute before I get going. Have a couple of walking sticks/canes but not sure how to use them honestly. Entire R LE and UE are  still numb. When I fell I was at a friend's house and were leaving, about an hour before that I had a glass of wine, I went out first and held the door for the next people but  didn't realize that the concrete platform ended and dropped about 15 inches and ended up stepping off that 15 inch ledge and hit my head/shoulder/hip/knee on the wall beyond that. Meningioma is still just being monitored, saw the MD and they said its not growing but its just there in the spine. Every single movement I make with my neck hurts when I get to the end of that range.   PERTINENT HISTORY: See above- complex medical hx  PAIN:  Are you having pain? 2/10 R knee with movement   PRECAUTIONS: Fall and Other: caution with movements, some movements can cause increased pressure/pain at meningioma site, ICD    RED FLAGS: None   WEIGHT BEARING RESTRICTIONS: No  FALLS:  Has patient fallen in last 6 months? Yes. Number of falls 1- had a mis-step coming off a ladder, fell backwards and hit the floor pretty hard  LIVING ENVIRONMENT: Lives with: lives with their spouse Lives in: House/apartment Stairs: 5 STE with B rails, has second floor in his home but holds rail    OCCUPATION: retired   PLOF: Independent, Independent with basic ADLs, Independent with gait, and Independent with transfers  PATIENT GOALS: getting more active, improving balance   NEXT MD VISIT: Referring scheduled but unsure when   OBJECTIVE:  Note: Objective measures were completed at Evaluation unless otherwise noted.    PATIENT SURVEYS:  ABC scale 65.6%; 12/13/23 65%  COGNITION: Overall cognitive status: Within functional limits for tasks assessed     SENSATION: Complete R UE and R LE numbness (chronic)    COORDINATION  Noted some ataxia in R LE with functional movements   LOWER EXTREMITY MMT:  MMT Right eval Left eval  Hip flexion 4+ 5  Hip extension    Hip abduction 5 5  Hip adduction    Hip internal rotation    Hip external rotation    Knee flexion 5 5  Knee extension 5 5  Ankle dorsiflexion 5 5  Ankle plantarflexion    Ankle inversion    Ankle eversion     (Blank rows = not  tested)   FUNCTIONAL TESTS:  5 times sit to stand: 15.4 seconds use of UEs, some difficulty stabilizing; 11/20/23 15.2 seconds use of UEs  Timed up and go (TUG): 14.2  seconds no device but did need MinA for balance when initially standing and then turning to sit in the chair at end of test; 11/20/23 10 seconds  3 minute walk test: 655ft no device but needed MinA at times for balance on corners, RPE 4/10; 12/13/23 568ft no device distant S, RPE 3/10     12/13/23 0001  02/12/24  Standardized Balance Assessment    Standardized Balance Assessment Dynamic Gait Index    Dynamic Gait Index    Level Surface 2 2 3   Change in Gait Speed 2 2 3   Gait with Horizontal Head Turns 2 2 2   Gait with Vertical Head Turns 2 2 1   Gait and Pivot Turn 1 1 1   Step Over Obstacle 1 1 1   Step Around Obstacles 1 2 2   Steps 0 2 2  Total Score 11 14 15       GAIT: Distance walked: 664ft Assistive device utilized: None Level of assistance: Min A Comments: MinA for balance with turns and when navigating obstacles, some ataxic movements noted                                                                                                                                 TREATMENT DATE:  03/06/24 NuStep L 5 x 6 min Spoke extensively about at home self care. He reports having an exercise bike and rowing machine at home as well as weights and bands.   03/04/24 NuStep L 5 L5 x 6 min Resisted side steps 30lb x5 each HS curls 35lb 3x15 Leg Ext 15lb 2x15 Sit to stand from elevated surface LE nn mat x10, x5 Shoulder Ext 10lb 2x10  02/21/24 Goals   ABC : 1010 / 1600 = 63.1 % NuStep L 5 x 5 min  Gait outside w/ SPC  Down slope on L side of back parking lot and back up three standing rest breaks Leg press 40lb 2x15 Seated Rows & Lats 25lb 2x10  02/19/24 NuStep L5 x 7 min HS curls 35lb 2x15 Leg Ext 10lb 2x15 S2S LE on airex elevated surface 2x10 Alt 6in box taps w/ SPC 2x10 Leg press 40lb 2x10  Stopped after 1  set due to R knee pain Shoulder Ext and rows 10lb 2x12   02/14/24 NuStep L5 x 6 min Resisted gait 4 way 30lb x 3 each  Seated rest needed between sets 4in step up x10 each some UE assit at times  HS curls 35lb 2x15 Leg Ext 10lb 2x10   02/12/24 NuStep L5 x 6 min Goals   ABC : 1050 / 1600 = 65.6 %  DGI 15  Gait outside around front L and front center [place] in back barking lot PRE 7/10 ~ 716ft 4in step up x10 each some UE assit at times  Shoulder Ext and rows 10lb 2x12   02/01/24 Bike L 3.5 x4 min NuStep L 5 x 3 min  Gait out the around the  front back [place] into the front of the clinic. Up and down slope. Pt has a difficult time controlling velocity with down hill ambulation. Resisted gait 4 way 30lb x 4 each  4in step up x10 each some UE assit at times  Standing rows 2x10 35lb Heel raises 2x15   PATIENT EDUCATION:  Education details: exam findings, POC Person educated: Patient Education method: Explanation, Demonstration, and Verbal cues Education comprehension: verbalized understanding, returned demonstration, and needs further education  HOME EXERCISE PROGRAM:  Access Code: [email or web address] URL: [email or web address] Date: 12/21/2023 Prepared by: Josette Rough  Exercises - Wide Tandem Stance with Eyes Open  - 1 x daily - 7 x weekly - 2 sets - 3 reps - 30 seconds  hold - Romberg Stance  - 1 x daily - 7 x weekly - 2 sets - 3 reps - 30 second  hold - Single Leg Stance with Support  - 1 x daily - 7 x weekly - 2 sets - 3 reps - Supine Transversus Abdominis Bracing - Hands on Stomach  - 5 x daily - 7 x weekly - 1 sets - 10 reps - 3 seconds  hold - Supine Posterior Pelvic Tilt  - 2 x daily - 7 x weekly - 1 sets - 10 reps - 3 seconds  hold - Supine March  - 1 x daily - 7 x weekly - 3 sets - 10 reps    ASSESSMENT:  CLINICAL IMPRESSION: Pt enters ambulating without AD. He reports no functional limitations at all, but does have bouts of instability. Educated pt  today on the importance to stay active to maintain his functional ability. He does have exercises equipment at home and was informed to use it a couple times a week to maintain strength and mobility.    EVAL: Patient is a 78 y.o. M who was seen today for physical therapy evaluation and treatment for Free Text Diagnosis R29.6, R26.89, R26.9 Mult. Falls, Poor Balance, Gait Disturbance. Very pleasant and cooperative but very talkative during eval which did limit assessment time. Still having a lot of pain in general since his fall  and ongoing numbness in R UE/LE. Gait mechanics and balance are definitely impaired, would benefit from training with appropriate AD if he is open to this. Will benefit from skilled PT services to address his concerns and functional objective impairments to the best of our ability.   OBJECTIVE IMPAIRMENTS: Abnormal gait, cardiopulmonary status limiting activity, decreased activity tolerance, decreased balance, decreased mobility, and difficulty walking.   ACTIVITY LIMITATIONS: standing, squatting, stairs, transfers, reach over head, and locomotion level  PARTICIPATION LIMITATIONS: driving, shopping, community activity, and occupation  PERSONAL FACTORS: Age, Behavior pattern, Fitness, Past/current experiences, Social background, and Time since onset of injury/illness/exacerbation are also affecting patient's functional outcome.   REHAB POTENTIAL: Good  CLINICAL DECISION MAKING: Evolving/moderate complexity  EVALUATION COMPLEXITY: Moderate   GOALS: Goals reviewed with patient? No  SHORT TERM GOALS: Target date: 11/23/2023   Will be compliant with appropriate progressive HEP  Baseline: Goal status: MET 11/23/23  2.  Will be able to name 3 ways to prevent falls at home and in the community  Baseline:  Goal status: MET 11/23/23  3.  Will be able to perform 5xSTS and with distant S Baseline: MinA  Goal status: 12/13/23 MET   4.  Will complete TUG test in 12  seconds or less S  Baseline: MinA  Goal status: MET 11/20/23    LONG TERM GOALS: Target date: 02/01/2024  Will score at least 18 on DGI to show improved functional balance  Baseline:  Goal status: ONGOING 12/13/23 DGI 11/24, 01/04/24 progressing  14/30, Ongoing 02/12/24  2.  Will be able to ambulate community distances (level and inclines) no device, RPE no more than 2/10  Baseline:  Goal status: ONGOING 12/13/23 (RPE 6/10), Ongoing 01/23/24 RPE 7/10, Ongoing 02/01/24, Ongoing 02/12/24, Progressing 5/10 02/21/24  3.  Will be able to perform all desired gym activities without increased pain from resting levels  Baseline:  Goal status: ONGOING 12/13/23 improving, improving 01/23/24, Met 02/01/24  4.  ABC score to improve by at least 20 points to show improved subjective status  Baseline:  Goal status: ONGOING 12/13/23, ONGOING 01/23/24, Ongoing 02/12/24    PLAN:  PT FREQUENCY: 2x/week  PT DURATION: 4 weeks  PLANNED INTERVENTIONS: 97164- PT Re-evaluation, 97110-Therapeutic exercises, 97530- Therapeutic activity, 97112- Neuromuscular re-education, 97535- Self Care, [id number]- Manual therapy, [phone]- Gait training, [phone]- Aquatic Therapy, and Balance training  PLAN FOR NEXT SESSION:   PHYSICAL THERAPY DISCHARGE SUMMARY  Visits from Start of Care: 29   Patient agrees to discharge. Patient goals were partially met. Patient is being discharged due to maximized rehab potential.    Tanda Sorrow, PTA 03/06/24 11:45 AM

## 2024-03-08 DIAGNOSIS — E538 Deficiency of other specified B group vitamins: Secondary | ICD-10-CM | POA: Diagnosis not present

## 2024-03-10 ENCOUNTER — Other Ambulatory Visit: Payer: Self-pay | Admitting: Cardiology

## 2024-03-11 ENCOUNTER — Ambulatory Visit: Admitting: Physical Therapy

## 2024-03-11 NOTE — Telephone Encounter (Signed)
 Rx refill sent to pharmacy.

## 2024-03-13 ENCOUNTER — Ambulatory Visit: Admitting: Physical Therapy

## 2024-03-18 DIAGNOSIS — E538 Deficiency of other specified B group vitamins: Secondary | ICD-10-CM | POA: Diagnosis not present

## 2024-03-25 DIAGNOSIS — E538 Deficiency of other specified B group vitamins: Secondary | ICD-10-CM | POA: Diagnosis not present

## 2024-03-29 NOTE — Addendum Note (Signed)
 Addended by: TAWNI DRILLING D on: 03/29/2024 01:14 PM   Modules accepted: Orders

## 2024-03-29 NOTE — Progress Notes (Signed)
 Remote ICD transmission.

## 2024-04-01 DIAGNOSIS — E538 Deficiency of other specified B group vitamins: Secondary | ICD-10-CM | POA: Diagnosis not present

## 2024-04-08 DIAGNOSIS — E538 Deficiency of other specified B group vitamins: Secondary | ICD-10-CM | POA: Diagnosis not present

## 2024-04-15 DIAGNOSIS — R7989 Other specified abnormal findings of blood chemistry: Secondary | ICD-10-CM | POA: Diagnosis not present

## 2024-04-18 DIAGNOSIS — L237 Allergic contact dermatitis due to plants, except food: Secondary | ICD-10-CM | POA: Diagnosis not present

## 2024-04-18 DIAGNOSIS — M7989 Other specified soft tissue disorders: Secondary | ICD-10-CM | POA: Diagnosis not present

## 2024-04-26 DIAGNOSIS — R7989 Other specified abnormal findings of blood chemistry: Secondary | ICD-10-CM | POA: Diagnosis not present

## 2024-04-29 DIAGNOSIS — Z23 Encounter for immunization: Secondary | ICD-10-CM | POA: Diagnosis not present

## 2024-05-03 DIAGNOSIS — R7989 Other specified abnormal findings of blood chemistry: Secondary | ICD-10-CM | POA: Diagnosis not present

## 2024-05-07 ENCOUNTER — Ambulatory Visit (INDEPENDENT_AMBULATORY_CARE_PROVIDER_SITE_OTHER): Payer: Medicare Other

## 2024-05-07 DIAGNOSIS — I255 Ischemic cardiomyopathy: Secondary | ICD-10-CM | POA: Diagnosis not present

## 2024-05-08 LAB — CUP PACEART REMOTE DEVICE CHECK
Battery Remaining Longevity: 79 mo
Battery Voltage: 2.99 V
Brady Statistic AP VP Percent: 7.13 %
Brady Statistic AP VS Percent: 0.04 %
Brady Statistic AS VP Percent: 90.64 %
Brady Statistic AS VS Percent: 2.19 %
Brady Statistic RA Percent Paced: 7.14 %
Brady Statistic RV Percent Paced: 90.73 %
Date Time Interrogation Session: 20250826001703
HighPow Impedance: 71 Ohm
Implantable Lead Connection Status: 753985
Implantable Lead Connection Status: 753985
Implantable Lead Connection Status: 753985
Implantable Lead Implant Date: 20230222
Implantable Lead Implant Date: 20230222
Implantable Lead Implant Date: 20230222
Implantable Lead Location: 753858
Implantable Lead Location: 753859
Implantable Lead Location: 753860
Implantable Lead Model: 4798
Implantable Lead Model: 5076
Implantable Pulse Generator Implant Date: 20230222
Lead Channel Impedance Value: 190 Ohm
Lead Channel Impedance Value: 203.256
Lead Channel Impedance Value: 218.298
Lead Channel Impedance Value: 218.298
Lead Channel Impedance Value: 235.98 Ohm
Lead Channel Impedance Value: 380 Ohm
Lead Channel Impedance Value: 380 Ohm
Lead Channel Impedance Value: 380 Ohm
Lead Channel Impedance Value: 437 Ohm
Lead Channel Impedance Value: 513 Ohm
Lead Channel Impedance Value: 513 Ohm
Lead Channel Impedance Value: 532 Ohm
Lead Channel Impedance Value: 570 Ohm
Lead Channel Impedance Value: 646 Ohm
Lead Channel Impedance Value: 760 Ohm
Lead Channel Impedance Value: 779 Ohm
Lead Channel Impedance Value: 817 Ohm
Lead Channel Impedance Value: 836 Ohm
Lead Channel Pacing Threshold Amplitude: 0.5 V
Lead Channel Pacing Threshold Amplitude: 0.75 V
Lead Channel Pacing Threshold Amplitude: 1.875 V
Lead Channel Pacing Threshold Pulse Width: 0.4 ms
Lead Channel Pacing Threshold Pulse Width: 0.4 ms
Lead Channel Pacing Threshold Pulse Width: 0.4 ms
Lead Channel Sensing Intrinsic Amplitude: 0.875 mV
Lead Channel Sensing Intrinsic Amplitude: 0.875 mV
Lead Channel Sensing Intrinsic Amplitude: 16.125 mV
Lead Channel Sensing Intrinsic Amplitude: 16.125 mV
Lead Channel Setting Pacing Amplitude: 1.75 V
Lead Channel Setting Pacing Amplitude: 2 V
Lead Channel Setting Pacing Amplitude: 2.5 V
Lead Channel Setting Pacing Pulse Width: 0.4 ms
Lead Channel Setting Pacing Pulse Width: 0.4 ms
Lead Channel Setting Sensing Sensitivity: 0.3 mV
Zone Setting Status: 755011
Zone Setting Status: 755011

## 2024-05-09 ENCOUNTER — Ambulatory Visit: Payer: Self-pay | Admitting: Cardiology

## 2024-05-10 DIAGNOSIS — E538 Deficiency of other specified B group vitamins: Secondary | ICD-10-CM | POA: Diagnosis not present

## 2024-05-16 DIAGNOSIS — N1832 Chronic kidney disease, stage 3b: Secondary | ICD-10-CM | POA: Diagnosis not present

## 2024-05-17 ENCOUNTER — Other Ambulatory Visit: Payer: Self-pay | Admitting: Cardiology

## 2024-05-17 DIAGNOSIS — E538 Deficiency of other specified B group vitamins: Secondary | ICD-10-CM | POA: Diagnosis not present

## 2024-05-28 NOTE — Progress Notes (Signed)
Remote ICD Transmission.

## 2024-05-29 DIAGNOSIS — I5022 Chronic systolic (congestive) heart failure: Secondary | ICD-10-CM | POA: Diagnosis not present

## 2024-05-29 DIAGNOSIS — E872 Acidosis, unspecified: Secondary | ICD-10-CM | POA: Diagnosis not present

## 2024-05-29 DIAGNOSIS — N2581 Secondary hyperparathyroidism of renal origin: Secondary | ICD-10-CM | POA: Diagnosis not present

## 2024-05-29 DIAGNOSIS — E559 Vitamin D deficiency, unspecified: Secondary | ICD-10-CM | POA: Diagnosis not present

## 2024-05-29 DIAGNOSIS — N1832 Chronic kidney disease, stage 3b: Secondary | ICD-10-CM | POA: Diagnosis not present

## 2024-05-29 DIAGNOSIS — N400 Enlarged prostate without lower urinary tract symptoms: Secondary | ICD-10-CM | POA: Diagnosis not present

## 2024-05-29 DIAGNOSIS — I129 Hypertensive chronic kidney disease with stage 1 through stage 4 chronic kidney disease, or unspecified chronic kidney disease: Secondary | ICD-10-CM | POA: Diagnosis not present

## 2024-05-29 DIAGNOSIS — D631 Anemia in chronic kidney disease: Secondary | ICD-10-CM | POA: Diagnosis not present

## 2024-06-10 DIAGNOSIS — R946 Abnormal results of thyroid function studies: Secondary | ICD-10-CM | POA: Diagnosis not present

## 2024-06-10 DIAGNOSIS — R7989 Other specified abnormal findings of blood chemistry: Secondary | ICD-10-CM | POA: Diagnosis not present

## 2024-06-10 DIAGNOSIS — N1832 Chronic kidney disease, stage 3b: Secondary | ICD-10-CM | POA: Diagnosis not present

## 2024-06-10 DIAGNOSIS — H2513 Age-related nuclear cataract, bilateral: Secondary | ICD-10-CM | POA: Diagnosis not present

## 2024-06-10 DIAGNOSIS — E1122 Type 2 diabetes mellitus with diabetic chronic kidney disease: Secondary | ICD-10-CM | POA: Diagnosis not present

## 2024-06-17 DIAGNOSIS — I25110 Atherosclerotic heart disease of native coronary artery with unstable angina pectoris: Secondary | ICD-10-CM | POA: Diagnosis not present

## 2024-06-17 DIAGNOSIS — N1832 Chronic kidney disease, stage 3b: Secondary | ICD-10-CM | POA: Diagnosis not present

## 2024-06-17 DIAGNOSIS — Z950 Presence of cardiac pacemaker: Secondary | ICD-10-CM | POA: Diagnosis not present

## 2024-06-17 DIAGNOSIS — I10 Essential (primary) hypertension: Secondary | ICD-10-CM | POA: Diagnosis not present

## 2024-06-17 DIAGNOSIS — N401 Enlarged prostate with lower urinary tract symptoms: Secondary | ICD-10-CM | POA: Diagnosis not present

## 2024-06-17 DIAGNOSIS — R269 Unspecified abnormalities of gait and mobility: Secondary | ICD-10-CM | POA: Diagnosis not present

## 2024-06-17 DIAGNOSIS — R7989 Other specified abnormal findings of blood chemistry: Secondary | ICD-10-CM | POA: Diagnosis not present

## 2024-06-17 DIAGNOSIS — D420 Neoplasm of uncertain behavior of cerebral meninges: Secondary | ICD-10-CM | POA: Diagnosis not present

## 2024-06-17 DIAGNOSIS — Z95820 Peripheral vascular angioplasty status with implants and grafts: Secondary | ICD-10-CM | POA: Diagnosis not present

## 2024-06-17 DIAGNOSIS — E1122 Type 2 diabetes mellitus with diabetic chronic kidney disease: Secondary | ICD-10-CM | POA: Diagnosis not present

## 2024-06-17 DIAGNOSIS — I255 Ischemic cardiomyopathy: Secondary | ICD-10-CM | POA: Diagnosis not present

## 2024-06-17 DIAGNOSIS — Z8674 Personal history of sudden cardiac arrest: Secondary | ICD-10-CM | POA: Diagnosis not present

## 2024-07-01 DIAGNOSIS — M25562 Pain in left knee: Secondary | ICD-10-CM | POA: Diagnosis not present

## 2024-07-01 DIAGNOSIS — W19XXXA Unspecified fall, initial encounter: Secondary | ICD-10-CM | POA: Diagnosis not present

## 2024-07-01 DIAGNOSIS — R531 Weakness: Secondary | ICD-10-CM | POA: Diagnosis not present

## 2024-07-01 DIAGNOSIS — S0091XA Abrasion of unspecified part of head, initial encounter: Secondary | ICD-10-CM | POA: Diagnosis not present

## 2024-07-01 DIAGNOSIS — M2392 Unspecified internal derangement of left knee: Secondary | ICD-10-CM | POA: Diagnosis not present

## 2024-07-01 DIAGNOSIS — S0990XA Unspecified injury of head, initial encounter: Secondary | ICD-10-CM | POA: Diagnosis not present

## 2024-07-08 DIAGNOSIS — M25562 Pain in left knee: Secondary | ICD-10-CM | POA: Diagnosis not present

## 2024-07-31 ENCOUNTER — Other Ambulatory Visit: Payer: Self-pay

## 2024-07-31 ENCOUNTER — Ambulatory Visit: Attending: Family Medicine

## 2024-07-31 DIAGNOSIS — D329 Benign neoplasm of meninges, unspecified: Secondary | ICD-10-CM | POA: Insufficient documentation

## 2024-07-31 DIAGNOSIS — R2681 Unsteadiness on feet: Secondary | ICD-10-CM | POA: Insufficient documentation

## 2024-07-31 DIAGNOSIS — R278 Other lack of coordination: Secondary | ICD-10-CM | POA: Insufficient documentation

## 2024-07-31 DIAGNOSIS — M6281 Muscle weakness (generalized): Secondary | ICD-10-CM | POA: Diagnosis not present

## 2024-07-31 DIAGNOSIS — R262 Difficulty in walking, not elsewhere classified: Secondary | ICD-10-CM | POA: Insufficient documentation

## 2024-07-31 DIAGNOSIS — R296 Repeated falls: Secondary | ICD-10-CM | POA: Insufficient documentation

## 2024-07-31 NOTE — Therapy (Signed)
 OUTPATIENT PHYSICAL THERAPY NEURO EVALUATION   Patient Name: Timothy Moran MRN: [id number] DOB:Aug 04, 1946, 78 y.o., male Today's Date: 07/31/2024   PCP: Aisha Harvey, MD REFERRING PROVIDER: same  END OF SESSION:  PT End of Session - 07/31/24 1305     Visit Number 1    Date for Recertification  10/23/24    Progress Note Due on Visit 10    PT Start Time 1303    PT Stop Time 1345    PT Time Calculation (min) 42 min    Activity Tolerance Patient tolerated treatment well    Behavior During Therapy [facility] for tasks assessed/performed          Past Medical History:  Diagnosis Date   Acute blood loss anemia 10/05/2020   Acute respiratory failure (HCC), hypothermia therapy, vent - extubated 10/06/20    AKI (acute kidney injury) 10/05/2020   Anoxic brain injury (HCC) 10/26/2020   Arrhythmia    Ascending aortic aneurysm 01/29/2018   a. 2019 4.3 cm by echo; b. 02/2021 Echo: Ao root 40mm.   Benign brain tumor (HCC)    Benign neoplasm of brain (HCC) 12/19/2017   Cardiac arrest with ventricular fibrillation (HCC)    Chest pain 12/04/2017   Chronic kidney disease, stage III (moderate) (HCC) 12/19/2017   CKD (chronic kidney disease), stage III - IV (HCC)    Conductive hearing loss of both ears 08/24/2018   Coronary Artery Disease    a. 09/2020 Inf STEMI complicated by cardiac arrest/PCI: LAD 50p/m, LCX 100d (2.5 x 30 Resolute Onyx DES); b. 04/2021 PCI: 04/2021 LM nl, LAD 50p/22m (2.5x30 Onyx Frontier DES), LCX 25p, 71m (RFR 0.98), 40d ISR (RFR 0.98), OM2 25, RCA mild diff dzs.   Diabetes mellitus, type 2 (HCC) 10/06/2020   10/06/20 A1C 7%   Dizziness 10/11/2019   Encounter for imaging study to confirm orogastric (OG) tube placement    Eustachian tube dysfunction 08/24/2018   Hematuria 10/26/2020   HFrEF (heart failure with reduced ejection fraction) (HCC)    a. 11/2020 Echo: EF 25-30%; b. 02/2021 Echo: EF 25-30%, glob HK. Mild LVH. Nl RV fxn. Triv AI. Ao root 40mm.   History of  pulmonary embolus (PE)    HLD (hyperlipidemia) 10/26/2020   Hypertension    Iron deficiency anemia rec'd IV iron 10/26/2020   Ischemic cardiomyopathy    a. 11/2020 Echo: EF 25-30%; b. 02/2021 Echo: EF 25-30%, glob HK.   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 SABRASpirometry 03/08/2018    FEV1 3.86 (108%)  Ratio 96 s prior rx  - PET  03/13/18   Low grade c/w adenoca > rec T surgery eval     STEMI (ST elevation myocardial infarction) (HCC) 10/04/2020   Urinary retention 10/26/2020   Vestibular schwannoma (HCC) 08/14/2020   Past Surgical History:  Procedure  Laterality Date   APPENDECTOMY     BIV ICD INSERTION CRT-D N/A 11/03/2021   Procedure: BIV ICD INSERTION CRT-D;  Surgeon: Inocencio Soyla Lunger, MD;  Location: [facility] INVASIVE CV LAB;  Service: Cardiovascular;  Laterality: N/A;   CARDIAC CATHETERIZATION     CORONARY ANGIOPLASTY WITH STENT PLACEMENT Left 04/12/2021   LAD stent placement   CORONARY PRESSURE/FFR STUDY N/A 04/12/2021   Procedure: INTRAVASCULAR PRESSURE WIRE/FFR STUDY;  Surgeon: Mady Bruckner, MD;  Location: [facility] INVASIVE CV LAB;  Service: Cardiovascular;  Laterality: N/A;   CORONARY STENT INTERVENTION N/A 04/12/2021   Procedure: CORONARY STENT INTERVENTION;  Surgeon: Mady Bruckner, MD;  Location: [facility] INVASIVE CV LAB;  Service: Cardiovascular;  Laterality: N/A;    CORONARY ULTRASOUND/IVUS N/A 04/12/2021   Procedure: Intravascular Ultrasound/IVUS;  Surgeon: Mady Bruckner, MD;  Location: [facility] INVASIVE CV LAB;  Service: Cardiovascular;  Laterality: N/A;   CORONARY/GRAFT ACUTE MI REVASCULARIZATION N/A 10/04/2020   Procedure: Coronary/Graft Acute MI Revascularization;  Surgeon: Mady Bruckner, MD;  Location: [facility] INVASIVE CV LAB;  Service: Cardiovascular;  Laterality: N/A;   HERNIA REPAIR     LEFT HEART CATH AND CORONARY ANGIOGRAPHY N/A 10/04/2020   Procedure: LEFT HEART CATH AND CORONARY ANGIOGRAPHY;  Surgeon: Mady Bruckner, MD;  Location: [facility] INVASIVE CV LAB;  Service: Cardiovascular;  Laterality: N/A;   RIGHT/LEFT HEART CATH AND CORONARY ANGIOGRAPHY N/A 04/12/2021   Procedure: RIGHT/LEFT HEART CATH AND CORONARY ANGIOGRAPHY;  Surgeon: Mady Bruckner, MD;  Location: [facility] INVASIVE CV LAB;  Service: Cardiovascular;  Laterality: N/A;   Patient Active Problem List   Diagnosis Date Noted   Anemia    Leukocytosis    Right arm weakness 01/25/2022   Right sided weakness    Fall at home 01/22/2022   Status post biventricular pacemaker - MRI compatible PPM and leads 01/22/2022   Chronic indwelling Foley catheter 01/22/2022   CKD (chronic kidney disease), stage III - IV (HCC) 06/28/2021   Chronic HFrEF (heart failure with reduced ejection fraction) (HCC) 03/31/2021   Arrhythmia    DM (diabetes mellitus), type 2 (HCC) new 10/26/2020   Urinary retention 10/26/2020   Hyperlipidemia LDL goal <70 10/26/2020   Coronary artery disease of native artery of native heart with stable angina pectoris 10/26/2020   S/P angioplasty with stent 10/04/20 DES to LCX  10/26/2020   Ischemic cardiomyopathy 10/26/2020   Diabetes mellitus, type 2 (HCC) 10/06/2020   DOE (dyspnea on exertion)    Meningioma (HCC) 08/14/2020   Vestibular schwannoma (HCC) 08/14/2020   Cardiac arrest with ventricular fibrillation (HCC)    Pain of left hip joint 05/29/2020   Benign brain tumor (HCC)     Chronic kidney disease    History of pulmonary embolus (PE)    Hypertension    Nonsustained ventricular tachycardia (HCC) 10/11/2019   Dizziness 10/11/2019   Abnormal echocardiogram 06/07/2019   Eustachian tube dysfunction 08/24/2018   Conductive hearing loss of both ears 08/24/2018   Pulmonary hypertension due to thromboembolism (HCC) 03/09/2018   Solitary pulmonary nodule on lung CT 03/08/2018   Ascending aortic aneurysm (HCC) 4.2 cm based on CT from December 2020 01/29/2018   Sinus bradycardia 12/22/2017   Left bundle branch block 12/19/2017   Personal history of pulmonary embolism - no longer on anticoagulation due to pt refusal to take it anymore. 12/19/2017   Chronic kidney disease, stage III (moderate) (HCC) 12/19/2017   Benign neoplasm of brain (HCC) 12/19/2017    ONSET DATE: chronic, recent fall Jul 01 2024  REFERRING DIAG: unsteady gait,  falls  THERAPY DIAG:  Unsteadiness on feet  Difficulty in walking, not elsewhere classified  Meningioma (HCC)  Repeated falls  Muscle weakness (generalized)  Other lack of coordination  Rationale for Evaluation and Treatment: Rehabilitation  SUBJECTIVE:                                                                                                                                                                                             SUBJECTIVE STATEMENT: The patient reports a recent fall at home, stood up too fast and fell when standing from sofa, injured, twisted L knee, had x rays, L knee pain resolved. States he needs to get moving more  Pt accompanied by: self  PERTINENT HISTORY: chronic balance deficits,   PAIN:  Are you having pain? L knee after fall one month ago, anterior, primarily has resolved  PRECAUTIONS: Fall and ICD/Pacemaker  RED FLAGS: None   WEIGHT BEARING RESTRICTIONS: No  FALLS: Has patient fallen in last 6 months? Yes. Number of falls one   LIVING ENVIRONMENT: Lives with: lives with their  family and lives with their spouse Lives in: House/apartment  Has following equipment at home: Single point cane and Environmental Consultant - 2 wheeled  PLOF: Independent with household mobility with device  PATIENT GOALS: improve overall strength  OBJECTIVE:  Note: Objective measures were completed at Evaluation unless otherwise noted.  DIAGNOSTIC FINDINGS: na  COGNITION: Overall cognitive status: Within functional limits for tasks assessed   SENSATION: WFL  COORDINATION: Diminished B  EDEMA:  None noted  POSTURE: tall , lean, wide base of support  LOWER EXTREMITY ROM:   wfl   LOWER EXTREMITY MMT:    MMT Right Eval Left Eval  Hip flexion 4- 4-  Hip extension 4- 4-  Hip abduction    Hip adduction    Hip internal rotation    Hip external rotation    Knee flexion    Knee extension 4- 4-  Ankle dorsiflexion 4-/5   Ankle plantarflexion    Ankle inversion    Ankle eversion    (Blank rows = not tested)  GAIT:walked 600' in 3 min 33 sec.  Consistent R foot drop and high steppage gait on R.  Frequent sway with gait and with turns  FUNCTIONAL TESTS:  5 times sit to stand: 14.66 Berg balance score 20/56 PATIENT SURVEYS:  LEFS  TREATMENT DATE: 07/31/24:   Evaluation Leg press 40# 2 x 15   PATIENT EDUCATION: Education details: POC, goals Person educated: Patient Education method: Explanation, Demonstration, Tactile cues, Verbal cues, and Handouts Education comprehension: verbalized understanding and returned demonstration  HOME EXERCISE PROGRAM:TBD   GOALS: Goals reviewed with patient? Yes  SHORT TERM GOALS: Target date: 2 weeks 08/14/24  I HEP  Baseline: Goal status: INITIAL  LONG TERM GOALS: Target date: 10/23/24, 12 weeks   Berg balance score  improve from 20/56 to 36/56 or greater Baseline:  Goal status: INITIAL  2.  5 x sit to stand  improve from 14.66 with UE support, to less than 14 sec, without B knee pain, quite painful today   Baseline:  Goal status: INITIAL  3.  Improve LE strength, hip extension and B quads to 4+/5  Baseline: 4-/5 Goal status: INITIAL    ASSESSMENT:  CLINICAL IMPRESSION: Patient is a 78 y.o. male who was evaluated today by physical therapy due to unsteady gait with falls . He demonstrates R foot drop, and poor static and dynamic standing balance.  Also weakness throughout B hips , LE's which influences his safety and ease of transitional movements. He should benefit from physical therapy for LE strengthening and balance training. Needs to utilize his cane when out of house which he states he does, however did not have with him today.  OBJECTIVE IMPAIRMENTS: decreased activity tolerance, decreased balance, decreased coordination, decreased endurance, decreased knowledge of condition, decreased knowledge of use of DME, difficulty walking, decreased strength, impaired perceived functional ability, improper body mechanics, and postural dysfunction.   ACTIVITY LIMITATIONS: carrying, lifting, bending, standing, squatting, stairs, transfers, bed mobility, and locomotion level  PARTICIPATION LIMITATIONS: meal prep, cleaning, laundry, driving, shopping, and community activity  PERSONAL FACTORS: Age, Behavior pattern, and Fitness are also affecting patient's functional outcome.   REHAB POTENTIAL: Fair    CLINICAL DECISION MAKING: Evolving/moderate complexity  EVALUATION COMPLEXITY: Moderate  PLAN:  PT FREQUENCY: 2x/week  PT DURATION: 12 weeks  PLANNED INTERVENTIONS: 97110-Therapeutic exercises, 97530- Therapeutic activity, [id number]- Neuromuscular re-education, 97535- Self Care, [id number]- Manual therapy, and Patient/Family education  PLAN FOR NEXT SESSION: bulk LE strengthening, gait with st cane, standing functional strength and balance training   Tidus Upchurch L Atheena Spano, PT, DPT, OCS 07/31/2024, 5:16  PM

## 2024-08-06 ENCOUNTER — Ambulatory Visit: Payer: Medicare Other

## 2024-08-06 DIAGNOSIS — I255 Ischemic cardiomyopathy: Secondary | ICD-10-CM | POA: Diagnosis not present

## 2024-08-07 LAB — CUP PACEART REMOTE DEVICE CHECK
Battery Remaining Longevity: 71 mo
Battery Voltage: 2.98 V
Brady Statistic AP VP Percent: 4.93 %
Brady Statistic AP VS Percent: 0.04 %
Brady Statistic AS VP Percent: 91.85 %
Brady Statistic AS VS Percent: 3.17 %
Brady Statistic RA Percent Paced: 4.92 %
Brady Statistic RV Percent Paced: 88.36 %
Date Time Interrogation Session: 20251125063327
HighPow Impedance: 72 Ohm
Implantable Lead Connection Status: 753985
Implantable Lead Connection Status: 753985
Implantable Lead Connection Status: 753985
Implantable Lead Implant Date: 20230222
Implantable Lead Implant Date: 20230222
Implantable Lead Implant Date: 20230222
Implantable Lead Location: 753858
Implantable Lead Location: 753859
Implantable Lead Location: 753860
Implantable Lead Model: 4798
Implantable Lead Model: 5076
Implantable Pulse Generator Implant Date: 20230222
Lead Channel Impedance Value: 235.98 Ohm
Lead Channel Impedance Value: 235.98 Ohm
Lead Channel Impedance Value: 250.87 Ohm
Lead Channel Impedance Value: 256.5 Ohm
Lead Channel Impedance Value: 274.19 Ohm
Lead Channel Impedance Value: 380 Ohm
Lead Channel Impedance Value: 437 Ohm
Lead Channel Impedance Value: 494 Ohm
Lead Channel Impedance Value: 513 Ohm
Lead Channel Impedance Value: 513 Ohm
Lead Channel Impedance Value: 570 Ohm
Lead Channel Impedance Value: 589 Ohm
Lead Channel Impedance Value: 722 Ohm
Lead Channel Impedance Value: 836 Ohm
Lead Channel Impedance Value: 855 Ohm
Lead Channel Impedance Value: 912 Ohm
Lead Channel Impedance Value: 912 Ohm
Lead Channel Impedance Value: 969 Ohm
Lead Channel Pacing Threshold Amplitude: 0.5 V
Lead Channel Pacing Threshold Amplitude: 0.875 V
Lead Channel Pacing Threshold Amplitude: 2.25 V
Lead Channel Pacing Threshold Pulse Width: 0.4 ms
Lead Channel Pacing Threshold Pulse Width: 0.4 ms
Lead Channel Pacing Threshold Pulse Width: 0.4 ms
Lead Channel Sensing Intrinsic Amplitude: 0.875 mV
Lead Channel Sensing Intrinsic Amplitude: 0.875 mV
Lead Channel Sensing Intrinsic Amplitude: 17.5 mV
Lead Channel Sensing Intrinsic Amplitude: 17.5 mV
Lead Channel Setting Pacing Amplitude: 1.75 V
Lead Channel Setting Pacing Amplitude: 2 V
Lead Channel Setting Pacing Amplitude: 2.75 V
Lead Channel Setting Pacing Pulse Width: 0.4 ms
Lead Channel Setting Pacing Pulse Width: 0.4 ms
Lead Channel Setting Sensing Sensitivity: 0.3 mV
Zone Setting Status: 755011
Zone Setting Status: 755011

## 2024-08-09 NOTE — Progress Notes (Signed)
 Remote ICD Transmission

## 2024-08-13 ENCOUNTER — Ambulatory Visit: Admitting: Physical Therapy

## 2024-08-13 DIAGNOSIS — R278 Other lack of coordination: Secondary | ICD-10-CM | POA: Diagnosis present

## 2024-08-13 DIAGNOSIS — R296 Repeated falls: Secondary | ICD-10-CM | POA: Diagnosis present

## 2024-08-13 DIAGNOSIS — R262 Difficulty in walking, not elsewhere classified: Secondary | ICD-10-CM | POA: Diagnosis present

## 2024-08-13 DIAGNOSIS — M6281 Muscle weakness (generalized): Secondary | ICD-10-CM | POA: Diagnosis present

## 2024-08-13 DIAGNOSIS — R2681 Unsteadiness on feet: Secondary | ICD-10-CM | POA: Diagnosis present

## 2024-08-13 NOTE — Therapy (Signed)
 OUTPATIENT PHYSICAL THERAPY NEURO   Patient Name: Timothy Moran MRN: [id number] DOB:31-Jul-1946, 78 y.o., male Today's Date: 08/13/2024   PCP: Aisha Harvey, MD REFERRING PROVIDER: same  END OF SESSION:  PT End of Session - 08/13/24 1226     Visit Number 2    Date for Recertification  10/23/24    PT Start Time 1227    PT Stop Time 1310    PT Time Calculation (min) 43 min          Past Medical History:  Diagnosis Date   Acute blood loss anemia 10/05/2020   Acute respiratory failure (HCC), hypothermia therapy, vent - extubated 10/06/20    AKI (acute kidney injury) 10/05/2020   Anoxic brain injury (HCC) 10/26/2020   Arrhythmia    Ascending aortic aneurysm 01/29/2018   a. 2019 4.3 cm by echo; b. 02/2021 Echo: Ao root 40mm.   Benign brain tumor (HCC)    Benign neoplasm of brain (HCC) 12/19/2017   Cardiac arrest with ventricular fibrillation (HCC)    Chest pain 12/04/2017   Chronic kidney disease, stage III (moderate) (HCC) 12/19/2017   CKD (chronic kidney disease), stage III - IV (HCC)    Conductive hearing loss of both ears 08/24/2018   Coronary Artery Disease    a. 09/2020 Inf STEMI complicated by cardiac arrest/PCI: LAD 50p/m, LCX 100d (2.5 x 30 Resolute Onyx DES); b. 04/2021 PCI: 04/2021 LM nl, LAD 50p/39m (2.5x30 Onyx Frontier DES), LCX 25p, 13m (RFR 0.98), 40d ISR (RFR 0.98), OM2 25, RCA mild diff dzs.   Diabetes mellitus, type 2 (HCC) 10/06/2020   10/06/20 A1C 7%   Dizziness 10/11/2019   Encounter for imaging study to confirm orogastric (OG) tube placement    Eustachian tube dysfunction 08/24/2018   Hematuria 10/26/2020   HFrEF (heart failure with reduced ejection fraction) (HCC)    a. 11/2020 Echo: EF 25-30%; b. 02/2021 Echo: EF 25-30%, glob HK. Mild LVH. Nl RV fxn. Triv AI. Ao root 40mm.   History of pulmonary embolus (PE)    HLD (hyperlipidemia) 10/26/2020   Hypertension    Iron deficiency anemia rec'd IV iron 10/26/2020   Ischemic cardiomyopathy    a. 11/2020  Echo: EF 25-30%; b. 02/2021 Echo: EF 25-30%, glob HK.   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 SABRASpirometry 03/08/2018    FEV1 3.86 (108%)  Ratio 96 s prior rx  - PET  03/13/18   Low grade c/w adenoca > rec T surgery eval     STEMI (ST elevation myocardial infarction) (HCC) 10/04/2020   Urinary retention 10/26/2020   Vestibular schwannoma (HCC) 08/14/2020   Past Surgical History:  Procedure Laterality Date   APPENDECTOMY     BIV ICD INSERTION CRT-D N/A 11/03/2021   Procedure: BIV ICD INSERTION CRT-D;  Surgeon: Inocencio Soyla Lunger, MD;  Location: [facility] INVASIVE CV LAB;  Service: Cardiovascular;  Laterality: N/A;   CARDIAC CATHETERIZATION     CORONARY ANGIOPLASTY WITH STENT PLACEMENT Left 04/12/2021   LAD stent placement   CORONARY PRESSURE/FFR STUDY N/A 04/12/2021   Procedure: INTRAVASCULAR PRESSURE WIRE/FFR STUDY;  Surgeon: Mady Bruckner, MD;  Location: [facility] INVASIVE CV LAB;  Service: Cardiovascular;  Laterality: N/A;   CORONARY STENT INTERVENTION N/A 04/12/2021   Procedure: CORONARY STENT INTERVENTION;  Surgeon: Mady Bruckner, MD;  Location: [facility] INVASIVE CV LAB;  Service: Cardiovascular;  Laterality: N/A;   CORONARY ULTRASOUND/IVUS N/A 04/12/2021   Procedure: Intravascular Ultrasound/IVUS;  Surgeon: Mady Bruckner, MD;  Location: [facility] INVASIVE CV LAB;  Service:  Cardiovascular;  Laterality: N/A;   CORONARY/GRAFT ACUTE MI REVASCULARIZATION N/A 10/04/2020   Procedure: Coronary/Graft Acute MI Revascularization;  Surgeon: Mady Bruckner, MD;  Location: [facility] INVASIVE CV LAB;  Service: Cardiovascular;  Laterality: N/A;   HERNIA REPAIR     LEFT HEART CATH AND CORONARY ANGIOGRAPHY N/A 10/04/2020   Procedure: LEFT HEART CATH AND CORONARY ANGIOGRAPHY;  Surgeon: Mady Bruckner, MD;  Location: [facility] INVASIVE CV LAB;  Service: Cardiovascular;  Laterality: N/A;   RIGHT/LEFT HEART CATH AND CORONARY ANGIOGRAPHY N/A 04/12/2021   Procedure: RIGHT/LEFT HEART CATH AND CORONARY ANGIOGRAPHY;  Surgeon: Mady Bruckner, MD;  Location: [facility] INVASIVE CV LAB;  Service: Cardiovascular;  Laterality: N/A;   Patient Active Problem List   Diagnosis Date Noted   Anemia    Leukocytosis    Right arm weakness 01/25/2022   Right sided weakness    Fall at home 01/22/2022   Status post biventricular pacemaker - MRI compatible PPM and leads 01/22/2022   Chronic indwelling Foley catheter 01/22/2022   CKD (chronic kidney disease), stage III - IV (HCC) 06/28/2021   Chronic HFrEF (heart failure with reduced ejection fraction) (HCC) 03/31/2021   Arrhythmia    DM (diabetes mellitus), type 2 (HCC) new 10/26/2020   Urinary retention 10/26/2020   Hyperlipidemia LDL goal <70 10/26/2020   Coronary artery disease of native artery of native heart with stable angina pectoris 10/26/2020   S/P angioplasty with stent 10/04/20 DES to LCX  10/26/2020   Ischemic cardiomyopathy 10/26/2020   Diabetes mellitus, type 2 (HCC) 10/06/2020   DOE (dyspnea on exertion)    Meningioma (HCC) 08/14/2020   Vestibular schwannoma (HCC) 08/14/2020   Cardiac arrest with ventricular fibrillation (HCC)    Pain of left hip joint 05/29/2020   Benign brain tumor (HCC)    Chronic kidney disease    History of pulmonary embolus (PE)    Hypertension    Nonsustained ventricular tachycardia (HCC) 10/11/2019   Dizziness 10/11/2019    Abnormal echocardiogram 06/07/2019   Eustachian tube dysfunction 08/24/2018   Conductive hearing loss of both ears 08/24/2018   Pulmonary hypertension due to thromboembolism (HCC) 03/09/2018   Solitary pulmonary nodule on lung CT 03/08/2018   Ascending aortic aneurysm (HCC) 4.2 cm based on CT from December 2020 01/29/2018   Sinus bradycardia 12/22/2017   Left bundle branch block 12/19/2017   Personal history of pulmonary embolism - no longer on anticoagulation due to pt refusal to take it anymore. 12/19/2017   Chronic kidney disease, stage III (moderate) (HCC) 12/19/2017   Benign neoplasm of brain (HCC) 12/19/2017    ONSET DATE: chronic, recent fall Jul 01 2024  REFERRING DIAG: unsteady gait, falls  THERAPY DIAG:  Unsteadiness on feet  Difficulty in walking, not elsewhere classified  Repeated falls  Muscle weakness (generalized)  Rationale for Evaluation and Treatment: Rehabilitation  SUBJECTIVE:                                                                                                                                                                                             SUBJECTIVE STATEMENT:   uneasy The patient reports a recent fall at home, stood up too fast and fell when standing from sofa, injured, twisted L knee, had x rays, L knee pain resolved. States he needs to get moving more  Pt accompanied by: self  PERTINENT HISTORY: chronic balance deficits,   PAIN:  Are you having pain?NO PRECAUTIONS: Fall and ICD/Pacemaker  RED FLAGS: None   WEIGHT BEARING RESTRICTIONS: No  FALLS: Has patient fallen in last 6 months? Yes. Number of falls one   LIVING ENVIRONMENT: Lives with: lives with their family and lives with their spouse Lives in: House/apartment  Has following equipment at home: Single point cane and Environmental Consultant - 2 wheeled  PLOF: Independent with household mobility with device  PATIENT GOALS: improve overall strength  OBJECTIVE:  Note:  Objective measures were completed at Evaluation unless otherwise noted.  DIAGNOSTIC FINDINGS: na  COGNITION: Overall cognitive status: Within functional limits for tasks assessed   SENSATION: WFL  COORDINATION: Diminished B  EDEMA:  None noted  POSTURE: tall , lean, wide base of support  LOWER EXTREMITY ROM:   wfl   LOWER EXTREMITY MMT:    MMT Right Eval Left Eval  Hip flexion 4- 4-  Hip extension 4- 4-  Hip abduction    Hip adduction    Hip internal rotation    Hip external rotation    Knee flexion    Knee extension 4- 4-  Ankle dorsiflexion 4-/5   Ankle plantarflexion    Ankle inversion    Ankle eversion    (Blank rows = not tested)  GAIT:walked 600' in 3 min 33 sec.  Consistent R foot drop and high steppage gait on R.  Frequent sway with gait and with turns  FUNCTIONAL TESTS:  5 times sit to stand: 14.66 Berg balance score 20/56 PATIENT SURVEYS:  LEFS  TREATMENT DATE:   08/13/24 Nustep L 5 - almost lost balance when getting off Nustep Knee ext 10# 2 sets 10 HS curl 25# 2 sets 10 Leg Press 40# 3 sets 10 ,seat number 9 if lower unable to push up, some mild RT knee pain STS on airex from elevated mat -CGA 10 x Ball toss on airex-CGA, struggled with high balls Steps ups 4 inch on airex 10 x fwd each leg then laterally 10 x each leg CG- min  A with UE support Resisted gait 30# 5 x fwd and backward, 4 x each side // bars side stepping on foam beam and tandem with UE needed 5 x each 30#   07/31/24:   Evaluation Leg press 40# 2 x 15   PATIENT EDUCATION: Education details: POC, goals Person educated: Patient Education method: Explanation, Demonstration, Tactile cues, Verbal cues, and Handouts Education comprehension: verbalized understanding and returned demonstration  HOME EXERCISE PROGRAM:TBD   GOALS: Goals reviewed  with patient? Yes  SHORT TERM GOALS: Target date: 2 weeks 08/14/24  I HEP  Baseline: Goal status: INITIAL  LONG TERM GOALS: Target date: 10/23/24, 12 weeks   Berg balance score  improve from 20/56 to 36/56 or greater Baseline:  Goal status: INITIAL  2.  5 x sit to stand improve from 14.66 with UE support, to less than 14 sec, without B knee pain, quite painful today   Baseline:  Goal status: INITIAL  3.  Improve LE strength, hip extension and B quads to 4+/5  Baseline: 4-/5 Goal status: INITIAL    ASSESSMENT:  CLINICAL IMPRESSION: pt returns for 1st visit after eval . Progressed ex for strength and balance with assistance and cuing as needed and documented above. Pnt very unsteady getting off and walking btwn interventions, assistance needed to prevent falls.Assess response to todays session and progress as well as issue HEP   Patient is a 78 y.o. male who was evaluated today by physical therapy due to unsteady gait with falls . He demonstrates R foot drop, and poor static and dynamic standing balance.  Also weakness throughout B hips , LE's which influences his safety and ease of transitional movements. He should benefit from physical therapy for LE strengthening and balance training. Needs to utilize his cane when out of house which he states he does, however did not have with him today.  OBJECTIVE IMPAIRMENTS: decreased activity tolerance, decreased balance, decreased coordination, decreased endurance, decreased knowledge of condition, decreased knowledge of use of DME, difficulty walking, decreased strength, impaired perceived functional ability, improper body mechanics, and postural dysfunction.   ACTIVITY LIMITATIONS: carrying, lifting, bending, standing, squatting, stairs, transfers, bed mobility, and locomotion level  PARTICIPATION LIMITATIONS: meal prep, cleaning, laundry, driving, shopping, and community activity  PERSONAL FACTORS: Age, Behavior pattern, and Fitness are  also affecting patient's functional outcome.   REHAB POTENTIAL: Fair    CLINICAL DECISION MAKING: Evolving/moderate complexity  EVALUATION COMPLEXITY: Moderate  PLAN:  PT FREQUENCY: 2x/week  PT DURATION: 12 weeks  PLANNED INTERVENTIONS: 97110-Therapeutic exercises, 97530- Therapeutic activity, [id number]- Neuromuscular re-education, 97535- Self Care, [id number]- Manual therapy, and Patient/Family education  PLAN FOR NEXT SESSION: bulk LE strengthening, gait with st cane, standing functional strength and balance training  HEP   Annikah Lovins,ANGIE, PTA, 08/13/2024, 12:27 PM

## 2024-08-15 ENCOUNTER — Ambulatory Visit: Admitting: Physical Therapy

## 2024-08-15 DIAGNOSIS — R2681 Unsteadiness on feet: Secondary | ICD-10-CM | POA: Diagnosis not present

## 2024-08-15 DIAGNOSIS — R262 Difficulty in walking, not elsewhere classified: Secondary | ICD-10-CM

## 2024-08-15 DIAGNOSIS — R296 Repeated falls: Secondary | ICD-10-CM

## 2024-08-15 DIAGNOSIS — M6281 Muscle weakness (generalized): Secondary | ICD-10-CM

## 2024-08-15 NOTE — Therapy (Signed)
 OUTPATIENT PHYSICAL THERAPY NEURO   Patient Name: Timothy Moran MRN: [id number] DOB:03-09-1946, 78 y.o., male Today's Date: 08/15/2024   PCP: Aisha Harvey, MD REFERRING PROVIDER: same  END OF SESSION:  PT End of Session - 08/15/24 1357     Visit Number 3    Date for Recertification  10/23/24    PT Start Time 1355    PT Stop Time 1440    PT Time Calculation (min) 45 min          Past Medical History:  Diagnosis Date   Acute blood loss anemia 10/05/2020   Acute respiratory failure (HCC), hypothermia therapy, vent - extubated 10/06/20    AKI (acute kidney injury) 10/05/2020   Anoxic brain injury (HCC) 10/26/2020   Arrhythmia    Ascending aortic aneurysm 01/29/2018   a. 2019 4.3 cm by echo; b. 02/2021 Echo: Ao root 40mm.   Benign brain tumor (HCC)    Benign neoplasm of brain (HCC) 12/19/2017   Cardiac arrest with ventricular fibrillation (HCC)    Chest pain 12/04/2017   Chronic kidney disease, stage III (moderate) (HCC) 12/19/2017   CKD (chronic kidney disease), stage III - IV (HCC)    Conductive hearing loss of both ears 08/24/2018   Coronary Artery Disease    a. 09/2020 Inf STEMI complicated by cardiac arrest/PCI: LAD 50p/m, LCX 100d (2.5 x 30 Resolute Onyx DES); b. 04/2021 PCI: 04/2021 LM nl, LAD 50p/65m (2.5x30 Onyx Frontier DES), LCX 25p, 78m (RFR 0.98), 40d ISR (RFR 0.98), OM2 25, RCA mild diff dzs.   Diabetes mellitus, type 2 (HCC) 10/06/2020   10/06/20 A1C 7%   Dizziness 10/11/2019   Encounter for imaging study to confirm orogastric (OG) tube placement    Eustachian tube dysfunction 08/24/2018   Hematuria 10/26/2020   HFrEF (heart failure with reduced ejection fraction) (HCC)    a. 11/2020 Echo: EF 25-30%; b. 02/2021 Echo: EF 25-30%, glob HK. Mild LVH. Nl RV fxn. Triv AI. Ao root 40mm.   History of pulmonary embolus (PE)    HLD (hyperlipidemia) 10/26/2020   Hypertension    Iron deficiency anemia rec'd IV iron 10/26/2020   Ischemic cardiomyopathy    a. 11/2020  Echo: EF 25-30%; b. 02/2021 Echo: EF 25-30%, glob HK.   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 SABRASpirometry 03/08/2018    FEV1 3.86 (108%)  Ratio 96 s prior rx  - PET  03/13/18   Low grade c/w adenoca > rec T surgery eval     STEMI (ST elevation myocardial infarction) (HCC) 10/04/2020   Urinary retention 10/26/2020   Vestibular schwannoma (HCC) 08/14/2020   Past Surgical History:  Procedure Laterality Date   APPENDECTOMY     BIV ICD INSERTION CRT-D N/A 11/03/2021   Procedure: BIV ICD INSERTION CRT-D;  Surgeon: Inocencio Soyla Lunger, MD;  Location: [facility];  Service: Cardiovascular;  Laterality: N/A;   CARDIAC CATHETERIZATION     CORONARY ANGIOPLASTY WITH STENT PLACEMENT Left 04/12/2021   LAD stent placement   CORONARY PRESSURE/FFR STUDY N/A 04/12/2021   Procedure: INTRAVASCULAR PRESSURE WIRE/FFR STUDY;  Surgeon: Mady Bruckner, MD;  Location: [facility];  Service: Cardiovascular;  Laterality: N/A;   CORONARY STENT INTERVENTION N/A 04/12/2021   Procedure: CORONARY STENT INTERVENTION;  Surgeon: Mady Bruckner, MD;  Location: [facility];  Service: Cardiovascular;  Laterality: N/A;   CORONARY ULTRASOUND/IVUS N/A 04/12/2021   Procedure: Intravascular Ultrasound/IVUS;  Surgeon: Mady Bruckner, MD;  Location: [facility];  Service:  Cardiovascular;  Laterality: N/A;   CORONARY/GRAFT ACUTE MI REVASCULARIZATION N/A 10/04/2020   Procedure: Coronary/Graft Acute MI Revascularization;  Surgeon: Mady Bruckner, MD;  Location: [facility];  Service: Cardiovascular;  Laterality: N/A;   HERNIA REPAIR     LEFT HEART CATH AND CORONARY ANGIOGRAPHY N/A 10/04/2020   Procedure: LEFT HEART CATH AND CORONARY ANGIOGRAPHY;  Surgeon: Mady Bruckner, MD;  Location: [facility];  Service: Cardiovascular;  Laterality: N/A;   RIGHT/LEFT HEART CATH AND CORONARY ANGIOGRAPHY N/A 04/12/2021   Procedure: RIGHT/LEFT HEART CATH AND CORONARY ANGIOGRAPHY;  Surgeon: Mady Bruckner, MD;  Location: [facility];  Service: Cardiovascular;  Laterality: N/A;   Patient Active Problem List   Diagnosis Date Noted   Anemia    Leukocytosis    Right arm weakness 01/25/2022   Right sided weakness    Fall at home 01/22/2022   Status post biventricular pacemaker - MRI compatible PPM and leads 01/22/2022   Chronic indwelling Foley catheter 01/22/2022   CKD (chronic kidney disease), stage III - IV (HCC) 06/28/2021   Chronic HFrEF (heart failure with reduced ejection fraction) (HCC) 03/31/2021   Arrhythmia    DM (diabetes mellitus), type 2 (HCC) new 10/26/2020   Urinary retention 10/26/2020   Hyperlipidemia LDL goal <70 10/26/2020   Coronary artery disease of native artery of native heart with stable angina pectoris 10/26/2020   S/P angioplasty with stent 10/04/20 DES to LCX  10/26/2020   Ischemic cardiomyopathy 10/26/2020   Diabetes mellitus, type 2 (HCC) 10/06/2020   DOE (dyspnea on exertion)    Meningioma (HCC) 08/14/2020   Vestibular schwannoma (HCC) 08/14/2020   Cardiac arrest with ventricular fibrillation (HCC)    Pain of left hip joint 05/29/2020   Benign brain tumor (HCC)    Chronic kidney disease    History of pulmonary embolus (PE)    Hypertension    Nonsustained ventricular tachycardia (HCC) 10/11/2019   Dizziness 10/11/2019    Abnormal echocardiogram 06/07/2019   Eustachian tube dysfunction 08/24/2018   Conductive hearing loss of both ears 08/24/2018   Pulmonary hypertension due to thromboembolism (HCC) 03/09/2018   Solitary pulmonary nodule on lung CT 03/08/2018   Ascending aortic aneurysm (HCC) 4.2 cm based on CT from December 2020 01/29/2018   Sinus bradycardia 12/22/2017   Left bundle branch block 12/19/2017   Personal history of pulmonary embolism - no longer on anticoagulation due to pt refusal to take it anymore. 12/19/2017   Chronic kidney disease, stage III (moderate) (HCC) 12/19/2017   Benign neoplasm of brain (HCC) 12/19/2017    ONSET DATE: chronic, recent fall Jul 01 2024  REFERRING DIAG: unsteady gait, falls  THERAPY DIAG:  Unsteadiness on feet  Difficulty in walking, not elsewhere classified  Repeated falls  Muscle weakness (generalized)  Rationale for Evaluation and Treatment: Rehabilitation  SUBJECTIVE:                                                                                                                                                                                             SUBJECTIVE STATEMENT:  felt okay after last session, not sore The patient reports a recent fall at home, stood up too fast and fell when standing from sofa, injured, twisted L knee, had x rays, L knee pain resolved. States he needs to get moving more  Pt accompanied by: self  PERTINENT HISTORY: chronic balance deficits,   PAIN:  Are you having pain?NO PRECAUTIONS: Fall and ICD/Pacemaker  RED FLAGS: None   WEIGHT BEARING RESTRICTIONS: No  FALLS: Has patient fallen in last 6 months? Yes. Number of falls one   LIVING ENVIRONMENT: Lives with: lives with their family and lives with their spouse Lives in: House/apartment  Has following equipment at home: Single point cane and Environmental [employer]ant - 2 wheeled  PLOF: Independent with household mobility with device  PATIENT GOALS: improve overall  strength  OBJECTIVE:  Note: Objective measures were completed at Evaluation unless otherwise noted.  DIAGNOSTIC FINDINGS: na  COGNITION: Overall cognitive status: Within functional limits for tasks assessed   SENSATION: WFL  COORDINATION: Diminished B  EDEMA:  None noted  POSTURE: tall , lean, wide base of support  LOWER EXTREMITY ROM:   wfl   LOWER EXTREMITY MMT:    MMT Right Eval Left Eval  Hip flexion 4- 4-  Hip extension 4- 4-  Hip abduction    Hip adduction    Hip internal rotation    Hip external rotation    Knee flexion    Knee extension 4- 4-  Ankle dorsiflexion 4-/5   Ankle plantarflexion    Ankle inversion    Ankle eversion    (Blank rows = not tested)  GAIT:walked 600' in 3 min 33 sec.  Consistent R foot drop and high steppage gait on R.  Frequent sway with gait and with turns  FUNCTIONAL TESTS:  5 times sit to stand: 14.66 Berg balance score 20/56 PATIENT SURVEYS:  LEFS  TREATMENT DATE:  08/15/24 Nustep L 5 HEP [facility] BALANCE PACKET- some with ankle wts 3# SL kicks 3 way. Marching fwd and back,side stepping, grapevine, tandem gait and stance, SLS , toe walking and heel walking Knee ext 10# 2 sets 10 HS curl 35# 2 sets 10 Leg Press 40# 3 sets 10 -seat #9 STS on airex CGA 10 x Resisted gait 30# 5 x fwd and backward, 4 x each side  08/13/24 Nustep L 5 - almost lost balance when getting off Nustep Knee ext 10# 2 sets 10 HS curl 25# 2 sets 10 Leg Press 40# 3 sets 10 ,seat number 9 if lower unable to push up, some mild RT knee pain STS on airex from elevated mat -CGA 10 x Ball toss on airex-CGA, struggled with high balls Steps ups 4 inch on airex 10 x fwd each leg then laterally 10 x each leg CG- min  A with UE support Resisted gait 30# 5 x fwd and backward, 4 x each side // bars side stepping on foam beam and  tandem with UE needed 5 x each 30#   07/31/24:   Evaluation Leg press 40# 2 x 15   PATIENT EDUCATION: 08/15/24- preformed and issued HO of [facility] Program- instructed to do by solid surface ie counter top or hallway for support and he VU. Okayed 3 ankle wts for SL kicks and marching.   Education details: POC, goals Person educated: Patient Education method: Explanation, Demonstration, Tactile cues, Verbal cues, and Handouts Education comprehension: verbalized understanding and returned demonstration  HOME EXERCISE PROGRAM:TBD   GOALS: Goals reviewed with patient? Yes  SHORT TERM GOALS: Target date: 2 weeks 08/14/24  I HEP  Baseline: Goal status: 08/15/24 MET  LONG TERM GOALS: Target date: 10/23/24, 12 weeks   Berg balance score  improve from 20/56 to 36/56 or greater Baseline:  Goal status: INITIAL  2.  5 x sit to stand improve from 14.66 with UE support, to less than 14 sec, without B knee pain, quite painful today   Baseline:  Goal status: INITIAL  3.  Improve LE strength, hip extension and B quads to 4+/5  Baseline: 4-/5 Goal status: INITIAL    ASSESSMENT:  CLINICAL IMPRESSION: preformed and issued HO of [facility] Program- instructed to do by solid surface ie counter top or hallway for support and he VU. Okayed 3# ankle wts  Patient is a 78 y.o. male who was evaluated today by physical therapy due to unsteady gait with falls . He demonstrates R foot drop, and poor static and dynamic standing balance.  Also weakness throughout B hips , LE's which influences his safety and ease of transitional movements. He should benefit from physical therapy for LE strengthening and balance training. Needs to utilize his cane when out of house which he states he does, however did not have with him today.  OBJECTIVE IMPAIRMENTS: decreased activity tolerance, decreased balance, decreased coordination, decreased endurance, decreased knowledge of condition, decreased knowledge of  use of DME, difficulty walking, decreased strength, impaired perceived functional ability, improper body mechanics, and postural dysfunction.   ACTIVITY LIMITATIONS: carrying, lifting, bending, standing, squatting, stairs, transfers, bed mobility, and locomotion level  PARTICIPATION LIMITATIONS: meal prep, cleaning, laundry, driving, shopping, and community activity  PERSONAL FACTORS: Age, Behavior pattern, and Fitness are also affecting patient's functional outcome.   REHAB POTENTIAL: Fair    CLINICAL DECISION MAKING: Evolving/moderate complexity  EVALUATION COMPLEXITY: Moderate  PLAN:  PT FREQUENCY: 2x/week  PT DURATION: 12  weeks  PLANNED INTERVENTIONS: 97110-Therapeutic exercises, 97530- Therapeutic activity, [id number]- Neuromuscular re-education, 97535- Self Care, [id number]- Manual therapy, and Patient/Family education  PLAN FOR NEXT SESSION: bulk LE strengthening, gait with st cane, standing functional strength and balance training  HEP   Shelden Raborn,ANGIE, PTA, 08/15/2024, 1:57 PM

## 2024-08-19 ENCOUNTER — Ambulatory Visit

## 2024-08-19 ENCOUNTER — Other Ambulatory Visit: Payer: Self-pay

## 2024-08-19 DIAGNOSIS — R2681 Unsteadiness on feet: Secondary | ICD-10-CM | POA: Diagnosis not present

## 2024-08-19 DIAGNOSIS — R296 Repeated falls: Secondary | ICD-10-CM

## 2024-08-19 DIAGNOSIS — R262 Difficulty in walking, not elsewhere classified: Secondary | ICD-10-CM

## 2024-08-19 DIAGNOSIS — M6281 Muscle weakness (generalized): Secondary | ICD-10-CM

## 2024-08-19 NOTE — Therapy (Signed)
 OUTPATIENT PHYSICAL THERAPY NEURO   Patient Name: Timothy Moran MRN: [id number] DOB:March 30, 1946, 78 y.o., male Today's Date: 08/19/2024   PCP: Aisha Harvey, MD REFERRING PROVIDER: same  END OF SESSION:  PT End of Session - 08/19/24 1121     Visit Number 4    Date for Recertification  10/23/24    Progress Note Due on Visit 10    PT Start Time 1100    PT Stop Time 1145    PT Time Calculation (min) 45 min    Activity Tolerance Patient tolerated treatment well    Behavior During Therapy [facility] for tasks assessed/performed           Past Medical History:  Diagnosis Date   Acute blood loss anemia 10/05/2020   Acute respiratory failure (HCC), hypothermia therapy, vent - extubated 10/06/20    AKI (acute kidney injury) 10/05/2020   Anoxic brain injury (HCC) 10/26/2020   Arrhythmia    Ascending aortic aneurysm 01/29/2018   a. 2019 4.3 cm by echo; b. 02/2021 Echo: Ao root 40mm.   Benign brain tumor (HCC)    Benign neoplasm of brain (HCC) 12/19/2017   Cardiac arrest with ventricular fibrillation (HCC)    Chest pain 12/04/2017   Chronic kidney disease, stage III (moderate) (HCC) 12/19/2017   CKD (chronic kidney disease), stage III - IV (HCC)    Conductive hearing loss of both ears 08/24/2018   Coronary Artery Disease    a. 09/2020 Inf STEMI complicated by cardiac arrest/PCI: LAD 50p/m, LCX 100d (2.5 x 30 Resolute Onyx DES); b. 04/2021 PCI: 04/2021 LM nl, LAD 50p/15m (2.5x30 Onyx Frontier DES), LCX 25p, 7m (RFR 0.98), 40d ISR (RFR 0.98), OM2 25, RCA mild diff dzs.   Diabetes mellitus, type 2 (HCC) 10/06/2020   10/06/20 A1C 7%   Dizziness 10/11/2019   Encounter for imaging study to confirm orogastric (OG) tube placement    Eustachian tube dysfunction 08/24/2018   Hematuria 10/26/2020   HFrEF (heart failure with reduced ejection fraction) (HCC)    a. 11/2020 Echo: EF 25-30%; b. 02/2021 Echo: EF 25-30%, glob HK. Mild LVH. Nl RV fxn. Triv AI. Ao root 40mm.   History of pulmonary  embolus (PE)    HLD (hyperlipidemia) 10/26/2020   Hypertension    Iron deficiency anemia rec'd IV iron 10/26/2020   Ischemic cardiomyopathy    a. 11/2020 Echo: EF 25-30%; b. 02/2021 Echo: EF 25-30%, glob HK.   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 SABRASpirometry 03/08/2018    FEV1 3.86 (108%)  Ratio 96 s prior rx  - PET  03/13/18   Low grade c/w adenoca > rec T surgery eval     STEMI (ST elevation myocardial infarction) (HCC) 10/04/2020   Urinary retention 10/26/2020   Vestibular schwannoma (HCC) 08/14/2020   Past Surgical History:  Procedure  Laterality Date   APPENDECTOMY     BIV ICD INSERTION CRT-D N/A 11/03/2021   Procedure: BIV ICD INSERTION CRT-D;  Surgeon: Inocencio Soyla Lunger, MD;  Location[facility] INVASIVE CV LAB;  Service: Cardiovascular;  Laterality: N/A;   CARDIAC CATHETERIZATION     CORONARY ANGIOPLASTY WITH STENT PLACEMENT Left 04/12/2021   LAD stent placement   CORONARY PRESSURE/FFR STUDY N/A 04/12/2021   Procedure: INTRAVASCULAR PRESSURE WIRE/FFR STUDY;  Surgeon: Mady Bruckner, MD;  Location: [facility] INVASIVE CV LAB;  Service: Cardiovascular;  Laterality: N/A;   CORONARY STENT INTERVENTION N/A 04/12/2021   Procedure: CORONARY STENT INTERVENTION;  Surgeon: Mady Bruckner, MD;  Location: [facility] INVASIVE CV LAB;  Service: Cardiovascular;  Laterality: N/A;   CORONARY  ULTRASOUND/IVUS N/A 04/12/2021   Procedure: Intravascular Ultrasound/IVUS;  Surgeon: Mady Bruckner, MD;  Location: [facility] INVASIVE CV LAB;  Service: Cardiovascular;  Laterality: N/A;   CORONARY/GRAFT ACUTE MI REVASCULARIZATION N/A 10/04/2020   Procedure: Coronary/Graft Acute MI Revascularization;  Surgeon: Mady Bruckner, MD;  Location: [facility] INVASIVE CV LAB;  Service: Cardiovascular;  Laterality: N/A;   HERNIA REPAIR     LEFT HEART CATH AND CORONARY ANGIOGRAPHY N/A 10/04/2020   Procedure: LEFT HEART CATH AND CORONARY ANGIOGRAPHY;  Surgeon: Mady Bruckner, MD;  Location: [facility] INVASIVE CV LAB;  Service: Cardiovascular;  Laterality: N/A;   RIGHT/LEFT HEART CATH AND CORONARY ANGIOGRAPHY N/A 04/12/2021   Procedure: RIGHT/LEFT HEART CATH AND CORONARY ANGIOGRAPHY;  Surgeon: Mady Bruckner, MD;  Location: [facility] INVASIVE CV LAB;  Service: Cardiovascular;  Laterality: N/A;   Patient Active Problem List   Diagnosis Date Noted   Anemia    Leukocytosis    Right arm weakness 01/25/2022   Right sided weakness    Fall at home 01/22/2022   Status post biventricular pacemaker - MRI compatible PPM and leads 01/22/2022   Chronic indwelling Foley catheter 01/22/2022   CKD (chronic kidney disease), stage III - IV (HCC) 06/28/2021   Chronic HFrEF (heart failure with reduced ejection fraction) (HCC) 03/31/2021   Arrhythmia    DM (diabetes mellitus), type 2 (HCC) new 10/26/2020   Urinary retention 10/26/2020   Hyperlipidemia LDL goal <70 10/26/2020   Coronary artery disease of native artery of native heart with stable angina pectoris 10/26/2020   S/P angioplasty with stent 10/04/20 DES to LCX  10/26/2020   Ischemic cardiomyopathy 10/26/2020   Diabetes mellitus, type 2 (HCC) 10/06/2020   DOE (dyspnea on exertion)    Meningioma (HCC) 08/14/2020   Vestibular schwannoma (HCC) 08/14/2020   Cardiac arrest with ventricular fibrillation (HCC)    Pain of left hip joint 05/29/2020   Benign brain tumor (HCC)    Chronic  kidney disease    History of pulmonary embolus (PE)    Hypertension    Nonsustained ventricular tachycardia (HCC) 10/11/2019   Dizziness 10/11/2019   Abnormal echocardiogram 06/07/2019   Eustachian tube dysfunction 08/24/2018   Conductive hearing loss of both ears 08/24/2018   Pulmonary hypertension due to thromboembolism (HCC) 03/09/2018   Solitary pulmonary nodule on lung CT 03/08/2018   Ascending aortic aneurysm (HCC) 4.2 cm based on CT from December 2020 01/29/2018   Sinus bradycardia 12/22/2017   Left bundle branch block 12/19/2017   Personal history of pulmonary embolism - no longer on anticoagulation due to pt refusal to take it anymore. 12/19/2017   Chronic kidney disease, stage III (moderate) (HCC) 12/19/2017   Benign neoplasm of brain (HCC) 12/19/2017    ONSET DATE: chronic, recent fall Jul 01 2024  REFERRING DIAG: unsteady gait,  falls  THERAPY DIAG:  Unsteadiness on feet  Difficulty in walking, not elsewhere classified  Repeated falls  Muscle weakness (generalized)  Rationale for Evaluation and Treatment: Rehabilitation  SUBJECTIVE:                                                                                                                                                                                             SUBJECTIVE STATEMENT:  08/19/24: in yard yesterday, trimming, etc , made my back sore  The patient reports a recent fall at home, stood up too fast and fell when standing from sofa, injured, twisted L knee, had x rays, L knee pain resolved. States he needs to get moving more  Pt accompanied by: self  PERTINENT HISTORY: chronic balance deficits,   PAIN:  Are you having pain?NO PRECAUTIONS: Fall and ICD/Pacemaker  RED FLAGS: None   WEIGHT BEARING RESTRICTIONS: No  FALLS: Has patient fallen in last 6 months? Yes. Number of falls one   LIVING ENVIRONMENT: Lives with: lives with their family and lives with their spouse Lives in:  House/apartment  Has following equipment at home: Single point cane and [employer] - 2 wheeled  PLOF: Independent with household mobility with device  PATIENT GOALS: improve overall strength  OBJECTIVE:  Note: Objective measures were completed at Evaluation unless otherwise noted.  DIAGNOSTIC FINDINGS: na  COGNITION: Overall cognitive status: Within functional limits for tasks assessed   SENSATION: WFL  COORDINATION: Diminished B  EDEMA:  None noted  POSTURE: tall , lean, wide base of support  LOWER EXTREMITY ROM:   wfl   LOWER EXTREMITY MMT:    MMT Right Eval Left Eval  Hip flexion 4- 4-  Hip extension 4- 4-  Hip abduction    Hip adduction    Hip internal rotation    Hip external rotation    Knee flexion    Knee extension 4- 4-  Ankle dorsiflexion 4-/5   Ankle plantarflexion    Ankle inversion    Ankle eversion    (Blank rows = not tested)  GAIT:walked 600' in 3 min 33 sec.  Consistent R foot drop and high steppage gait on R.  Frequent sway with gait and with turns  FUNCTIONAL TESTS:  5 times sit to stand: 14.66 Berg balance score 20/56 PATIENT SURVEYS:  LEFS  TREATMENT DATE:  08/19/24:  Knee flexion 45# 3 x 10 Knee ext 10 # 3 x 10 Standing 3 way hip, 10 reps, alt hip abd, flex, and ext with st knee, 1 1/2 cuff wts each ankle, needed CGA for balance with hip flexion, knees buckled at times Nustep L 5 x 6 min Leg press 40# , 10 x 2 feet level, 2 x 10 feet in staggered position each leg forward In ll bars for airex taps to 3 sea urchins, alt feet In ll bars in semi tandem standing, leading foot on 4 step, rotational reaches with red medibar, 15 x each leg In ll bars , leading foot on center of compliant side, low amplitude lunges forward, 15 x each leg, hands on ll bars, emphasis on short , controlled, low amplitude  movements  08/15/24 Nustep L 5 HEP [facility] [employer] P[employer]- some with ankle wts 3# SL kicks 3 way. Marching fwd and back,side stepping, grapevine, tandem gait and stance, SLS , toe walking and heel walking Knee ext 10# 2 sets 10 HS curl 35# 2 sets 10 Leg Press 40# 3 sets 10 -seat #9 STS on airex CGA 10 x Resisted gait 30# 5 x fwd and backward, 4 x each side  08/13/24 Nustep L 5 - almost lost balance when getting off Nustep Knee ext 10# 2 sets 10 HS curl 25# 2 sets 10 Leg Press 40# 3 sets 10 ,seat number 9 if lower unable to push up, some mild RT knee pain STS on airex from elevated mat -CGA 10 x Ball toss on airex-CGA, struggled with high balls Steps ups 4 inch on airex 10 x fwd each leg then laterally 10 x each leg CG- min  A with UE support Resisted gait 30# 5 x fwd and backward, 4 x each side // bars side stepping on foam beam and tandem with UE needed 5 x each 30#   07/31/24:   Evaluation Leg press 40# 2 x 15   PATIENT EDUCATION: 08/15/24- preformed and issued HO of [facility] Program- instructed to do by solid surface ie counter top or hallway for support and he VU. Okayed 3 ankle wts for SL kicks and marching.   Education details: POC, goals Person educated: Patient Education method: Explanation, Demonstration, Tactile cues, Verbal cues, and Handouts Education comprehension: verbalized understanding and returned demonstration  HOME EXERCISE PROGRAM:TBD   GOALS: Goals reviewed with patient? Yes  SHORT TERM GOALS: Target date: 2 weeks 08/14/24  I HEP  Baseline: Goal status: 08/15/24 MET  LONG TERM GOALS: Target date: 10/23/24, 12 weeks   Berg balance score  improve from 20/56 to 36/56 or greater Baseline:  Goal status: INITIAL  2.  5 x sit to stand improve from 14.66 with UE support, to less than 14 sec, without B knee pain, quite painful today   Baseline:  Goal status: INITIAL  3.  Improve LE strength, hip extension and B quads to 4+/5  Baseline:  4-/5 Goal status: INITIAL    ASSESSMENT:  CLINICAL IMPRESSION: 08/19/24: returned for balance and stability training. Emphasis on controlled, smaller amplitude movements, to engage his hip and knee, trunk musculature to compensate for sensory loss B ankles. He tolerated well.  Needed close contact guard assist with gait belt for the activities in the ll bars.    Patient is a 78 y.o. male who was evaluated today by physical therapy due to unsteady gait with falls . He demonstrates R foot drop, and poor static and  dynamic standing balance.  Also weakness throughout B hips , LE's which influences his safety and ease of transitional movements. He should benefit from physical therapy for LE strengthening and balance training. Needs to utilize his cane when out of house which he states he does, however did not have with him today.  OBJECTIVE IMPAIRMENTS: decreased activity tolerance, decreased balance, decreased coordination, decreased endurance, decreased knowledge of condition, decreased knowledge of use of DME, difficulty walking, decreased strength, impaired perceived functional ability, improper body mechanics, and postural dysfunction.   ACTIVITY LIMITATIONS: carrying, lifting, bending, standing, squatting, stairs, transfers, bed mobility, and locomotion level  PARTICIPATION LIMITATIONS: meal prep, cleaning, laundry, driving, shopping, and community activity  PERSONAL FACTORS: Age, Behavior pattern, and Fitness are also affecting patient's functional outcome.   REHAB POTENTIAL: Fair    CLINICAL DECISION MAKING: Evolving/moderate complexity  EVALUATION COMPLEXITY: Moderate  PLAN:  PT FREQUENCY: 2x/week  PT DURATION: 12 weeks  PLANNED INTERVENTIONS: 97110-Therapeutic exercises, 97530- Therapeutic activity, [id number]- Neuromuscular re-education, 97535- Self Care, [id number]- Manual therapy, and Patient/Family education  PLAN FOR NEXT SESSION: bulk LE strengthening, gait with st cane, standing  functional strength and balance training  HEP   Kemiyah Tarazon L Teirra Carapia, PT, DPT, OCS 08/19/2024, 11:50 AM

## 2024-08-20 ENCOUNTER — Other Ambulatory Visit: Payer: Self-pay | Admitting: Thoracic Surgery (Cardiothoracic Vascular Surgery)

## 2024-08-20 DIAGNOSIS — I7121 Aneurysm of the ascending aorta, without rupture: Secondary | ICD-10-CM

## 2024-08-21 ENCOUNTER — Ambulatory Visit: Payer: Self-pay | Admitting: Cardiology

## 2024-08-21 ENCOUNTER — Encounter: Payer: Self-pay | Admitting: Physical Therapy

## 2024-08-21 ENCOUNTER — Ambulatory Visit: Admitting: Physical Therapy

## 2024-08-21 DIAGNOSIS — R262 Difficulty in walking, not elsewhere classified: Secondary | ICD-10-CM

## 2024-08-21 DIAGNOSIS — R2681 Unsteadiness on feet: Secondary | ICD-10-CM

## 2024-08-21 DIAGNOSIS — R296 Repeated falls: Secondary | ICD-10-CM

## 2024-08-21 DIAGNOSIS — M6281 Muscle weakness (generalized): Secondary | ICD-10-CM

## 2024-08-21 NOTE — Therapy (Signed)
 OUTPATIENT PHYSICAL THERAPY NEURO   Patient Name: Timothy Moran MRN: [id number] DOB:12/07/1945, 78 y.o., male Today's Date: 08/21/2024   PCP: Aisha Harvey, MD REFERRING PROVIDER: same  END OF SESSION:  PT End of Session - 08/21/24 0930     Visit Number 5    Date for Recertification  10/23/24    PT Start Time 0930    PT Stop Time 1015    PT Time Calculation (min) 45 min    Activity Tolerance Patient tolerated treatment well    Behavior During Therapy [facility] for tasks assessed/performed           Past Medical History:  Diagnosis Date   Acute blood loss anemia 10/05/2020   Acute respiratory failure (HCC), hypothermia therapy, vent - extubated 10/06/20    AKI (acute kidney injury) 10/05/2020   Anoxic brain injury (HCC) 10/26/2020   Arrhythmia    Ascending aortic aneurysm 01/29/2018   a. 2019 4.3 cm by echo; b. 02/2021 Echo: Ao root 40mm.   Benign brain tumor (HCC)    Benign neoplasm of brain (HCC) 12/19/2017   Cardiac arrest with ventricular fibrillation (HCC)    Chest pain 12/04/2017   Chronic kidney disease, stage III (moderate) (HCC) 12/19/2017   CKD (chronic kidney disease), stage III - IV (HCC)    Conductive hearing loss of both ears 08/24/2018   Coronary Artery Disease    a. 09/2020 Inf STEMI complicated by cardiac arrest/PCI: LAD 50p/m, LCX 100d (2.5 x 30 Resolute Onyx DES); b. 04/2021 PCI: 04/2021 LM nl, LAD 50p/70m (2.5x30 Onyx Frontier DES), LCX 25p, 65m (RFR 0.98), 40d ISR (RFR 0.98), OM2 25, RCA mild diff dzs.   Diabetes mellitus, type 2 (HCC) 10/06/2020   10/06/20 A1C 7%   Dizziness 10/11/2019   Encounter for imaging study to confirm orogastric (OG) tube placement    Eustachian tube dysfunction 08/24/2018   Hematuria 10/26/2020   HFrEF (heart failure with reduced ejection fraction) (HCC)    a. 11/2020 Echo: EF 25-30%; b. 02/2021 Echo: EF 25-30%, glob HK. Mild LVH. Nl RV fxn. Triv AI. Ao root 40mm.   History of pulmonary embolus (PE)    HLD (hyperlipidemia)  10/26/2020   Hypertension    Iron deficiency anemia rec'd IV iron 10/26/2020   Ischemic cardiomyopathy    a. 11/2020 Echo: EF 25-30%; b. 02/2021 Echo: EF 25-30%, glob HK.   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 SABRASpirometry 03/08/2018    FEV1 3.86 (108%)  Ratio 96 s prior rx  - PET  03/13/18   Low grade c/w adenoca > rec T surgery eval     STEMI (ST elevation myocardial infarction) (HCC) 10/04/2020   Urinary retention 10/26/2020   Vestibular schwannoma (HCC) 08/14/2020   Past Surgical History:  Procedure Laterality Date   APPENDECTOMY  BIV ICD INSERTION CRT-D N/A 11/03/2021   Procedure: BIV ICD INSERTION CRT-D;  Surgeon: Inocencio Soyla Lunger, MD;  Location: [facility] INVASIVE CV LAB;  Service: Cardiovascular;  Laterality: N/A;   CARDIAC CATHETERIZATION     CORONARY ANGIOPLASTY WITH STENT PLACEMENT Left 04/12/2021   LAD stent placement   CORONARY PRESSURE/FFR STUDY N/A 04/12/2021   Procedure: INTRAVASCULAR PRESSURE WIRE/FFR STUDY;  Surgeon: Mady Bruckner, MD;  Location: [facility] INVASIVE CV LAB;  Service: Cardiovascular;  Laterality: N/A;   CORONARY STENT INTERVENTION N/A 04/12/2021   Procedure: CORONARY STENT INTERVENTION;  Surgeon: Mady Bruckner, MD;  Location: [facility] INVASIVE CV LAB;  Service: Cardiovascular;  Laterality: N/A;   CORONARY ULTRASOUND/IVUS N/A 04/12/2021    Procedure: Intravascular Ultrasound/IVUS;  Surgeon: Mady Bruckner, MD;  Location: [facility] INVASIVE CV LAB;  Service: Cardiovascular;  Laterality: N/A;   CORONARY/GRAFT ACUTE MI REVASCULARIZATION N/A 10/04/2020   Procedure: Coronary/Graft Acute MI Revascularization;  Surgeon: Mady Bruckner, MD;  Location: [facility] INVASIVE CV LAB;  Service: Cardiovascular;  Laterality: N/A;   HERNIA REPAIR     LEFT HEART CATH AND CORONARY ANGIOGRAPHY N/A 10/04/2020   Procedure: LEFT HEART CATH AND CORONARY ANGIOGRAPHY;  Surgeon: Mady Bruckner, MD;  Location: [facility] INVASIVE CV LAB;  Service: Cardiovascular;  Laterality: N/A;   RIGHT/LEFT HEART CATH AND CORONARY ANGIOGRAPHY N/A 04/12/2021   Procedure: RIGHT/LEFT HEART CATH AND CORONARY ANGIOGRAPHY;  Surgeon: Mady Bruckner, MD;  Location: [facility] INVASIVE CV LAB;  Service: Cardiovascular;  Laterality: N/A;   Patient Active Problem List   Diagnosis Date Noted   Anemia    Leukocytosis    Right arm weakness 01/25/2022   Right sided weakness    Fall at home 01/22/2022   Status post biventricular pacemaker - MRI compatible PPM and leads 01/22/2022   Chronic indwelling Foley catheter 01/22/2022   CKD (chronic kidney disease), stage III - IV (HCC) 06/28/2021   Chronic HFrEF (heart failure with reduced ejection fraction) (HCC) 03/31/2021   Arrhythmia    DM (diabetes mellitus), type 2 (HCC) new 10/26/2020   Urinary retention 10/26/2020   Hyperlipidemia LDL goal <70 10/26/2020   Coronary artery disease of native artery of native heart with stable angina pectoris 10/26/2020   S/P angioplasty with stent 10/04/20 DES to LCX  10/26/2020   Ischemic cardiomyopathy 10/26/2020   Diabetes mellitus, type 2 (HCC) 10/06/2020   DOE (dyspnea on exertion)    Meningioma (HCC) 08/14/2020   Vestibular schwannoma (HCC) 08/14/2020   Cardiac arrest with ventricular fibrillation (HCC)    Pain of left hip joint 05/29/2020   Benign brain tumor (HCC)    Chronic kidney disease    History of  pulmonary embolus (PE)    Hypertension    Nonsustained ventricular tachycardia (HCC) 10/11/2019   Dizziness 10/11/2019   Abnormal echocardiogram 06/07/2019   Eustachian tube dysfunction 08/24/2018   Conductive hearing loss of both ears 08/24/2018   Pulmonary hypertension due to thromboembolism (HCC) 03/09/2018   Solitary pulmonary nodule on lung CT 03/08/2018   Ascending aortic aneurysm (HCC) 4.2 cm based on CT from December 2020 01/29/2018   Sinus bradycardia 12/22/2017   Left bundle branch block 12/19/2017   Personal history of pulmonary embolism - no longer on anticoagulation due to pt refusal to take it anymore. 12/19/2017   Chronic kidney disease, stage III (moderate) (HCC) 12/19/2017   Benign neoplasm of brain (HCC) 12/19/2017    ONSET DATE: chronic, recent fall Jul 01 2024  REFERRING DIAG: unsteady gait, falls  THERAPY DIAG:  Unsteadiness on feet  Difficulty in walking, not elsewhere classified  Repeated falls  Muscle weakness (generalized)  Rationale for Evaluation and Treatment: Rehabilitation  SUBJECTIVE:                                                                                                                                                                                             SUBJECTIVE STATEMENT:  08/21/24: Mondays session was a little too hard, felt it for days after Pt accompanied by: self  PERTINENT HISTORY: chronic balance deficits,   PAIN:  Are you having pain? 1-2/10 low back PRECAUTIONS: Fall and ICD/Pacemaker  RED FLAGS: None   WEIGHT BEARING RESTRICTIONS: No  FALLS: Has patient fallen in last 6 months? Yes. Number of falls one   LIVING ENVIRONMENT: Lives with: lives with their family and lives with their spouse Lives in: House/apartment  Has following equipment at home: Single point cane and [employer]ant - 2 wheeled  PLOF: Independent with household mobility with device  PATIENT GOALS: improve overall strength  OBJECTIVE:  Note:  Objective measures were completed at Evaluation unless otherwise noted.  DIAGNOSTIC FINDINGS: na  COGNITION: Overall cognitive status: Within functional limits for tasks assessed   SENSATION: WFL  COORDINATION: Diminished B  EDEMA:  None noted  POSTURE: tall , lean, wide base of support  LOWER EXTREMITY ROM:   wfl   LOWER EXTREMITY MMT:    MMT Right Eval Left Eval  Hip flexion 4- 4-  Hip extension 4- 4-  Hip abduction    Hip adduction    Hip internal rotation    Hip external rotation    Knee flexion    Knee extension 4- 4-  Ankle dorsiflexion 4-/5   Ankle plantarflexion    Ankle inversion    Ankle eversion    (Blank rows = not tested)  GAIT:walked 600' in 3 min 33 sec.  Consistent R foot drop and high steppage gait on R.  Frequent sway with gait and with turns  FUNCTIONAL TESTS:  5 times sit to stand: 14.66 Berg balance score 20/56 PATIENT SURVEYS:  LEFS  TREATMENT DATE:  08/21/24 Sit to stands from elevated nat 2x10  Mod cues to keep LE from pushing against mat Nustep L 5 x 6 min Alt 4 inch and 6 inch box taps x10 each 30 lb resisted forward & backwards gait x4 each  Instability with backwards gait min assit LOB x1 6in step up 1 rail x10 6in lateral step up 1 rail x10  08/19/24:  Knee flexion 45# 3 x 10 Knee ext 10 # 3 x 10 Standing 3 way hip, 10 reps, alt hip abd, flex, and ext with st knee, 1 1/2 cuff wts each ankle, needed CGA for balance with hip flexion, knees buckled at times Nustep L 5 x 6 min Leg press 40# , 10 x 2 feet level, 2 x 10 feet in staggered position each leg forward In ll bars for airex taps to 3 sea urchins, alt feet In ll bars in semi tandem standing, leading foot on 4 step, rotational reaches with red medibar, 15 x each leg In ll bars , leading foot on center of compliant side, low amplitude lunges  forward, 15 x each leg, hands on ll bars, emphasis on short , controlled, low amplitude movements  08/15/24 Nustep L 5 HEP [facility] [employer]ANCE P[employer]- some with ankle wts 3# SL kicks 3 way. Marching fwd and back,side stepping, grapevine, tandem gait and stance, SLS , toe walking and heel walking Knee ext 10# 2 sets 10 HS curl 35# 2 sets 10 Leg Press 40# 3 sets 10 -seat #9 STS on airex CGA 10 x Resisted gait 30# 5 x fwd and backward, 4 x each side  08/13/24 Nustep L 5 - almost lost balance when getting off Nustep Knee ext 10# 2 sets 10 HS curl 25# 2 sets 10 Leg Press 40# 3 sets 10 ,seat number 9 if lower unable to push up, some mild RT knee pain STS on airex from elevated mat -CGA 10 x Ball toss on airex-CGA, struggled with high balls Steps ups 4 inch on airex 10 x fwd each leg then laterally 10 x each leg CG- min  A with UE support Resisted gait 30# 5 x fwd and backward, 4 x each side // bars side stepping on foam beam and tandem with UE needed 5 x each 30#   07/31/24:   Evaluation Leg press 40# 2 x 15   PATIENT EDUCATION: 08/15/24- preformed and issued HO of [facility] Program- instructed to do by solid surface ie counter top or hallway for support and he VU. Okayed 3 ankle wts for SL kicks and marching.   Education details: POC, goals Person educated: Patient Education method: Explanation, Demonstration, Tactile cues, Verbal cues, and Handouts Education comprehension: verbalized understanding and returned demonstration  HOME EXERCISE PROGRAM:TBD   GOALS: Goals reviewed with patient? Yes  SHORT TERM GOALS: Target date: 2 weeks 08/14/24  I HEP  Baseline: Goal status: 08/15/24 MET  LONG TERM GOALS: Target date: 10/23/24, 12 weeks   Berg balance score  improve from 20/56 to 36/56 or greater Baseline:  Goal status: INITIAL  2.  5 x sit to stand improve from 14.66 with UE support, to less than 14 sec, without B knee pain, quite painful today   Baseline:  Goal  status: INITIAL  3.  Improve LE strength, hip extension and B quads to 4+/5  Baseline: 4-/5 Goal status: INITIAL    ASSESSMENT:  CLINICAL IMPRESSION: 08/21/24: returned for balance and stability training. Emphasis on compound movements due to  subjective repots of soreness from last session.  Needed close contact guard assist with step ups interventions and resisted gait. PT can become randomly unstable with walking.    Patient is a 79 y.o. male who was evaluated today by physical therapy due to unsteady gait with falls . He demonstrates R foot drop, and poor static and dynamic standing balance.  Also weakness throughout B hips , LE's which influences his safety and ease of transitional movements. He should benefit from physical therapy for LE strengthening and balance training. Needs to utilize his cane when out of house which he states he does, however did not have with him today.  OBJECTIVE IMPAIRMENTS: decreased activity tolerance, decreased balance, decreased coordination, decreased endurance, decreased knowledge of condition, decreased knowledge of use of DME, difficulty walking, decreased strength, impaired perceived functional ability, improper body mechanics, and postural dysfunction.   ACTIVITY LIMITATIONS: carrying, lifting, bending, standing, squatting, stairs, transfers, bed mobility, and locomotion level  PARTICIPATION LIMITATIONS: meal prep, cleaning, laundry, driving, shopping, and community activity  PERSONAL FACTORS: Age, Behavior pattern, and Fitness are also affecting patient's functional outcome.   REHAB POTENTIAL: Fair    CLINICAL DECISION MAKING: Evolving/moderate complexity  EVALUATION COMPLEXITY: Moderate  PLAN:  PT FREQUENCY: 2x/week  PT DURATION: 12 weeks  PLANNED INTERVENTIONS: 97110-Therapeutic exercises, 97530- Therapeutic activity, 97112- Neuromuscular re-education, 97535- Self Care, [id number]- Manual therapy, and Patient/Family education  PLAN FOR NEXT  SESSION: bulk LE strengthening, gait with st cane, standing functional strength and balance training  HEP   Tanda KANDICE Sorrow, PTA, 08/21/2024, 9:31 AM

## 2024-08-26 ENCOUNTER — Encounter: Payer: Self-pay | Admitting: Physical Therapy

## 2024-08-26 ENCOUNTER — Ambulatory Visit: Admitting: Physical Therapy

## 2024-08-26 DIAGNOSIS — R2681 Unsteadiness on feet: Secondary | ICD-10-CM | POA: Diagnosis not present

## 2024-08-26 DIAGNOSIS — M6281 Muscle weakness (generalized): Secondary | ICD-10-CM

## 2024-08-26 DIAGNOSIS — R296 Repeated falls: Secondary | ICD-10-CM

## 2024-08-26 DIAGNOSIS — R262 Difficulty in walking, not elsewhere classified: Secondary | ICD-10-CM

## 2024-08-26 NOTE — Therapy (Signed)
 OUTPATIENT PHYSICAL THERAPY NEURO   Patient Name: Timothy Moran MRN: [id number] DOB:Jan 06, 1946, 78 y.o., male Today's Date: 08/26/2024   PCP: Aisha Harvey, MD REFERRING PROVIDER: same  END OF SESSION:  PT End of Session - 08/26/24 0927     Visit Number 6    Date for Recertification  10/23/24    PT Start Time 0930    PT Stop Time 1015    PT Time Calculation (min) 45 min    Activity Tolerance Patient tolerated treatment well    Behavior During Therapy [facility] for tasks assessed/performed           Past Medical History:  Diagnosis Date   Acute blood loss anemia 10/05/2020   Acute respiratory failure (HCC), hypothermia therapy, vent - extubated 10/06/20    AKI (acute kidney injury) 10/05/2020   Anoxic brain injury (HCC) 10/26/2020   Arrhythmia    Ascending aortic aneurysm 01/29/2018   a. 2019 4.3 cm by echo; b. 02/2021 Echo: Ao root 40mm.   Benign brain tumor (HCC)    Benign neoplasm of brain (HCC) 12/19/2017   Cardiac arrest with ventricular fibrillation (HCC)    Chest pain 12/04/2017   Chronic kidney disease, stage III (moderate) (HCC) 12/19/2017   CKD (chronic kidney disease), stage III - IV (HCC)    Conductive hearing loss of both ears 08/24/2018   Coronary Artery Disease    a. 09/2020 Inf STEMI complicated by cardiac arrest/PCI: LAD 50p/m, LCX 100d (2.5 x 30 Resolute Onyx DES); b. 04/2021 PCI: 04/2021 LM nl, LAD 50p/68m (2.5x30 Onyx Frontier DES), LCX 25p, 32m (RFR 0.98), 40d ISR (RFR 0.98), OM2 25, RCA mild diff dzs.   Diabetes mellitus, type 2 (HCC) 10/06/2020   10/06/20 A1C 7%   Dizziness 10/11/2019   Encounter for imaging study to confirm orogastric (OG) tube placement    Eustachian tube dysfunction 08/24/2018   Hematuria 10/26/2020   HFrEF (heart failure with reduced ejection fraction) (HCC)    a. 11/2020 Echo: EF 25-30%; b. 02/2021 Echo: EF 25-30%, glob HK. Mild LVH. Nl RV fxn. Triv AI. Ao root 40mm.   History of pulmonary embolus (PE)    HLD (hyperlipidemia)  10/26/2020   Hypertension    Iron deficiency anemia rec'd IV iron 10/26/2020   Ischemic cardiomyopathy    a. 11/2020 Echo: EF 25-30%; b. 02/2021 Echo: EF 25-30%, glob HK.   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 SABRASpirometry 03/08/2018    FEV1 3.86 (108%)  Ratio 96 s prior rx  - PET  03/13/18   Low grade c/w adenoca > rec T surgery eval     STEMI (ST elevation myocardial infarction) (HCC) 10/04/2020   Urinary retention 10/26/2020   Vestibular schwannoma (HCC) 08/14/2020   Past Surgical History:  Procedure Laterality Date   APPENDECTOMY  BIV ICD INSERTION CRT-D N/A 11/03/2021   Procedure: BIV ICD INSERTION CRT-D;  Surgeon: Inocencio Soyla Lunger, MD;  Location: [facility] INVASIVE CV LAB;  Service: Cardiovascular;  Laterality: N/A;   CARDIAC CATHETERIZATION     CORONARY ANGIOPLASTY WITH STENT PLACEMENT Left 04/12/2021   LAD stent placement   CORONARY PRESSURE/FFR STUDY N/A 04/12/2021   Procedure: INTRAVASCULAR PRESSURE WIRE/FFR STUDY;  Surgeon: Mady Bruckner, MD;  Location: [facility] INVASIVE CV LAB;  Service: Cardiovascular;  Laterality: N/A;   CORONARY STENT INTERVENTION N/A 04/12/2021   Procedure: CORONARY STENT INTERVENTION;  Surgeon: Mady Bruckner, MD;  Location: [facility] INVASIVE CV LAB;  Service: Cardiovascular;  Laterality: N/A;   CORONARY ULTRASOUND/IVUS N/A 04/12/2021    Procedure: Intravascular Ultrasound/IVUS;  Surgeon: Mady Bruckner, MD;  Location: [facility] INVASIVE CV LAB;  Service: Cardiovascular;  Laterality: N/A;   CORONARY/GRAFT ACUTE MI REVASCULARIZATION N/A 10/04/2020   Procedure: Coronary/Graft Acute MI Revascularization;  Surgeon: Mady Bruckner, MD;  Location: [facility] INVASIVE CV LAB;  Service: Cardiovascular;  Laterality: N/A;   HERNIA REPAIR     LEFT HEART CATH AND CORONARY ANGIOGRAPHY N/A 10/04/2020   Procedure: LEFT HEART CATH AND CORONARY ANGIOGRAPHY;  Surgeon: Mady Bruckner, MD;  Location: [facility] INVASIVE CV LAB;  Service: Cardiovascular;  Laterality: N/A;   RIGHT/LEFT HEART CATH AND CORONARY ANGIOGRAPHY N/A 04/12/2021   Procedure: RIGHT/LEFT HEART CATH AND CORONARY ANGIOGRAPHY;  Surgeon: Mady Bruckner, MD;  Location: [facility] INVASIVE CV LAB;  Service: Cardiovascular;  Laterality: N/A;   Patient Active Problem List   Diagnosis Date Noted   Anemia    Leukocytosis    Right arm weakness 01/25/2022   Right sided weakness    Fall at home 01/22/2022   Status post biventricular pacemaker - MRI compatible PPM and leads 01/22/2022   Chronic indwelling Foley catheter 01/22/2022   CKD (chronic kidney disease), stage III - IV (HCC) 06/28/2021   Chronic HFrEF (heart failure with reduced ejection fraction) (HCC) 03/31/2021   Arrhythmia    DM (diabetes mellitus), type 2 (HCC) new 10/26/2020   Urinary retention 10/26/2020   Hyperlipidemia LDL goal <70 10/26/2020   Coronary artery disease of native artery of native heart with stable angina pectoris 10/26/2020   S/P angioplasty with stent 10/04/20 DES to LCX  10/26/2020   Ischemic cardiomyopathy 10/26/2020   Diabetes mellitus, type 2 (HCC) 10/06/2020   DOE (dyspnea on exertion)    Meningioma (HCC) 08/14/2020   Vestibular schwannoma (HCC) 08/14/2020   Cardiac arrest with ventricular fibrillation (HCC)    Pain of left hip joint 05/29/2020   Benign brain tumor (HCC)    Chronic kidney disease    History of  pulmonary embolus (PE)    Hypertension    Nonsustained ventricular tachycardia (HCC) 10/11/2019   Dizziness 10/11/2019   Abnormal echocardiogram 06/07/2019   Eustachian tube dysfunction 08/24/2018   Conductive hearing loss of both ears 08/24/2018   Pulmonary hypertension due to thromboembolism (HCC) 03/09/2018   Solitary pulmonary nodule on lung CT 03/08/2018   Ascending aortic aneurysm (HCC) 4.2 cm based on CT from December 2020 01/29/2018   Sinus bradycardia 12/22/2017   Left bundle branch block 12/19/2017   Personal history of pulmonary embolism - no longer on anticoagulation due to pt refusal to take it anymore. 12/19/2017   Chronic kidney disease, stage III (moderate) (HCC) 12/19/2017   Benign neoplasm of brain (HCC) 12/19/2017    ONSET DATE: chronic, recent fall Jul 01 2024  REFERRING DIAG: unsteady gait, falls  THERAPY DIAG:  Unsteadiness on feet  Difficulty in walking, not elsewhere classified  Repeated falls  Muscle weakness (generalized)  Rationale for Evaluation and Treatment: Rehabilitation  SUBJECTIVE:                                                                                                                                                                                             SUBJECTIVE STATEMENT:  08/21/24: Doing so so Pt accompanied by: self  PERTINENT HISTORY: chronic balance deficits,   PAIN:  Are you having pain? 1-2/10 low back PRECAUTIONS: Fall and ICD/Pacemaker  RED FLAGS: None   WEIGHT BEARING RESTRICTIONS: No  FALLS: Has patient fallen in last 6 months? Yes. Number of falls one   LIVING ENVIRONMENT: Lives with: lives with their family and lives with their spouse Lives in: House/apartment  Has following equipment at home: Single point cane and Environmental Consultant - 2 wheeled  PLOF: Independent with household mobility with device  PATIENT GOALS: improve overall strength  OBJECTIVE:  Note: Objective measures were completed at Evaluation  unless otherwise noted.  DIAGNOSTIC FINDINGS: na  COGNITION: Overall cognitive status: Within functional limits for tasks assessed   SENSATION: WFL  COORDINATION: Diminished B  EDEMA:  None noted  POSTURE: tall , lean, wide base of support  LOWER EXTREMITY ROM:   wfl   LOWER EXTREMITY MMT:    MMT Right Eval Left Eval  Hip flexion 4- 4-  Hip extension 4- 4-  Hip abduction    Hip adduction    Hip internal rotation    Hip external rotation    Knee flexion    Knee extension 4- 4-  Ankle dorsiflexion 4-/5   Ankle plantarflexion    Ankle inversion    Ankle eversion    (Blank rows = not tested)  GAIT:walked 600' in 3 min 33 sec.  Consistent R foot drop and high steppage gait on R.  Frequent sway with gait and with turns  FUNCTIONAL TESTS:  5 times sit to stand: 14.66 Berg balance score 20/56 PATIENT SURVEYS:  LEFS  TREATMENT DATE:  1215/25 NuStep L 5x6 min 5 x sit to stand with UE, 14.02 sec HS curls 35lb 2x10 Leg Ext 10lb 2x10 6in step ups 1 rail x10 each Lateral step ups 6in x 10 each 1 rail In //bar reaching towards called out numbers  On airex  Sit to stand Le on airex mat elevated 2x10 Alt 4in box taps 2x10 SBA Heel raises     08/21/24 Sit to stands from elevated nat 2x10  Mod cues to keep LE from pushing against mat Nustep L 5 x 6 min Alt 4 inch and 6 inch box taps x10 each 30 lb resisted forward & backwards gait x4 each  Instability with backwards gait min assit LOB x1 6in step up 1 rail x10 6in lateral step up 1 rail x10  08/19/24:  Knee flexion 45# 3 x 10 Knee ext 10 # 3 x 10 Standing 3 way hip, 10 reps, alt hip abd, flex, and ext with st knee, 1 1/2 cuff wts each ankle, needed CGA for balance with hip flexion, knees buckled at times Nustep L 5 x 6 min Leg press 40# , 10 x 2 feet level, 2 x 10 feet in staggered  position each leg forward In ll bars for airex taps to 3 sea urchins, alt feet In ll bars in semi tandem standing, leading foot on 4 step, rotational reaches with red medibar, 15 x each leg In ll bars , leading foot on center of compliant side, low amplitude lunges forward, 15 x each leg, hands on ll bars, emphasis on short , controlled, low amplitude movements  08/15/24 Nustep L 5 HEP [facility] BALANCE PACKET- some with ankle wts 3# SL kicks 3 way. Marching fwd and back,side stepping, grapevine, tandem gait and stance, SLS , toe walking and heel walking Knee ext 10# 2 sets 10 HS curl 35# 2 sets 10 Leg Press 40# 3 sets 10 -seat #9 STS on airex CGA 10 x Resisted gait 30# 5 x fwd and backward, 4 x each side  08/13/24 Nustep L 5 - almost lost balance when getting off Nustep Knee ext 10# 2 sets 10 HS curl 25# 2 sets 10 Leg Press 40# 3 sets 10 ,seat number 9 if lower unable to push up, some mild RT knee pain STS on airex from elevated mat -CGA 10 x Ball toss on airex-CGA, struggled with high balls Steps ups 4 inch on airex 10 x fwd each leg then laterally 10 x each leg CG- min  A with UE support Resisted gait 30# 5 x fwd and backward, 4 x each side // bars side stepping on foam beam and tandem with UE needed 5 x each 30#   07/31/24:   Evaluation Leg press 40# 2 x 15   PATIENT EDUCATION: 08/15/24- preformed and issued HO of [facility] Program- instructed to do by solid surface ie counter top or hallway for support and he VU. Okayed 3 ankle wts for SL kicks and marching.   Education details: POC, goals Person educated: Patient Education method: Explanation, Demonstration, Tactile cues, Verbal cues, and Handouts Education comprehension: verbalized understanding and returned demonstration  HOME EXERCISE PROGRAM:TBD   GOALS: Goals reviewed with patient? Yes  SHORT TERM GOALS: Target date: 2 weeks 08/14/24  I HEP  Baseline: Goal status: 08/15/24 MET  LONG TERM GOALS:  Target date: 10/23/24, 12 weeks   Berg balance score  improve from 20/56 to 36/56 or greater Baseline:  Goal status: INITIAL  2.  5 x sit to stand improve from 14.66 with UE support, to less than 14 sec, without B knee pain, quite painful today   Baseline:  Goal status: Progressing 08/26/24  3.  Improve LE strength, hip extension and B quads to 4+/5  Baseline: 4-/5 Goal status: INITIAL    ASSESSMENT:  CLINICAL IMPRESSION: 08/26/24: returned for balance and stability training. He has progressed decreasing his 5X sit to stand time.   Needed close contact guard assist with step ups and alt box taps due to instability. Cue for full ROM and to hold contraction with leg curls and extensions.  PT can become randomly unstable with walking.    Patient is a 78 y.o. male who was evaluated today by physical therapy due to unsteady gait with falls . He demonstrates R foot drop, and poor static and dynamic standing balance.  Also weakness throughout B hips , LE's which influences his safety and ease of transitional movements. He should benefit from physical therapy for LE strengthening and balance training. Needs to utilize his cane when out of house which he states he does, however did not have with him today.  OBJECTIVE IMPAIRMENTS: decreased activity tolerance, decreased balance, decreased coordination, decreased endurance, decreased knowledge of condition, decreased knowledge of use of DME, difficulty walking, decreased strength, impaired perceived functional ability, improper body mechanics, and postural dysfunction.   ACTIVITY LIMITATIONS: carrying, lifting, bending, standing, squatting, stairs, transfers, bed mobility, and locomotion level  PARTICIPATION LIMITATIONS: meal prep, cleaning, laundry, driving, shopping, and community activity  PERSONAL FACTORS: Age, Behavior pattern, and Fitness are also affecting patient's functional outcome.   REHAB POTENTIAL: Fair    CLINICAL DECISION MAKING:  Evolving/moderate complexity  EVALUATION COMPLEXITY: Moderate  PLAN:  PT FREQUENCY: 2x/week  PT DURATION: 12 weeks  PLANNED INTERVENTIONS: 97110-Therapeutic exercises, 97530- Therapeutic activity, 97112- Neuromuscular re-education, 97535- Self Care, [id number]- Manual therapy, and Patient/Family education  PLAN FOR NEXT SESSION: bulk LE strengthening, gait with st cane, standing functional strength and balance training  HEP   Tanda KANDICE Sorrow, PTA, 08/26/2024, 9:28 AM

## 2024-08-28 ENCOUNTER — Other Ambulatory Visit: Payer: Self-pay

## 2024-08-28 ENCOUNTER — Ambulatory Visit

## 2024-08-28 DIAGNOSIS — R278 Other lack of coordination: Secondary | ICD-10-CM

## 2024-08-28 DIAGNOSIS — R296 Repeated falls: Secondary | ICD-10-CM

## 2024-08-28 DIAGNOSIS — M6281 Muscle weakness (generalized): Secondary | ICD-10-CM

## 2024-08-28 DIAGNOSIS — R2681 Unsteadiness on feet: Secondary | ICD-10-CM | POA: Diagnosis not present

## 2024-08-28 DIAGNOSIS — R262 Difficulty in walking, not elsewhere classified: Secondary | ICD-10-CM

## 2024-08-28 NOTE — Therapy (Signed)
 OUTPATIENT PHYSICAL THERAPY NEURO   Patient Name: Timothy Moran MRN: [id number] DOB:May 11, 1946, 78 y.o., male Today's Date: 08/28/2024   PCP: Aisha Harvey, MD REFERRING PROVIDER: same  END OF SESSION:  PT End of Session - 08/28/24 1300     Visit Number 7    Date for Recertification  10/23/24    Progress Note Due on Visit 10    PT Start Time 1300    PT Stop Time 1345    PT Time Calculation (min) 45 min    Activity Tolerance Patient tolerated treatment well    Behavior During Therapy [facility] for tasks assessed/performed            Past Medical History:  Diagnosis Date   Acute blood loss anemia 10/05/2020   Acute respiratory failure (HCC), hypothermia therapy, vent - extubated 10/06/20    AKI (acute kidney injury) 10/05/2020   Anoxic brain injury (HCC) 10/26/2020   Arrhythmia    Ascending aortic aneurysm 01/29/2018   a. 2019 4.3 cm by echo; b. 02/2021 Echo: Ao root 40mm.   Benign brain tumor (HCC)    Benign neoplasm of brain (HCC) 12/19/2017   Cardiac arrest with ventricular fibrillation (HCC)    Chest pain 12/04/2017   Chronic kidney disease, stage III (moderate) (HCC) 12/19/2017   CKD (chronic kidney disease), stage III - IV (HCC)    Conductive hearing loss of both ears 08/24/2018   Coronary Artery Disease    a. 09/2020 Inf STEMI complicated by cardiac arrest/PCI: LAD 50p/m, LCX 100d (2.5 x 30 Resolute Onyx DES); b. 04/2021 PCI: 04/2021 LM nl, LAD 50p/70m (2.5x30 Onyx Frontier DES), LCX 25p, 87m (RFR 0.98), 40d ISR (RFR 0.98), OM2 25, RCA mild diff dzs.   Diabetes mellitus, type 2 (HCC) 10/06/2020   10/06/20 A1C 7%   Dizziness 10/11/2019   Encounter for imaging study to confirm orogastric (OG) tube placement    Eustachian tube dysfunction 08/24/2018   Hematuria 10/26/2020   HFrEF (heart failure with reduced ejection fraction) (HCC)    a. 11/2020 Echo: EF 25-30%; b. 02/2021 Echo: EF 25-30%, glob HK. Mild LVH. Nl RV fxn. Triv AI. Ao root 40mm.   History of pulmonary  embolus (PE)    HLD (hyperlipidemia) 10/26/2020   Hypertension    Iron deficiency anemia rec'd IV iron 10/26/2020   Ischemic cardiomyopathy    a. 11/2020 Echo: EF 25-30%; b. 02/2021 Echo: EF 25-30%, glob HK.   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 SABRASpirometry 03/08/2018    FEV1 3.86 (108%)  Ratio 96 s prior rx  - PET  03/13/18   Low grade c/w adenoca > rec T surgery eval     STEMI (ST elevation myocardial infarction) (HCC) 10/04/2020   Urinary retention 10/26/2020   Vestibular schwannoma (HCC) 08/14/2020   Past Surgical History:  Procedure Laterality Date   APPENDECTOMY     BIV ICD INSERTION CRT-D N/A 11/03/2021   Procedure: BIV ICD INSERTION CRT-D;  Surgeon: Inocencio Soyla Lunger, MD;  Location: [facility] INVASIVE CV LAB;  Service: Cardiovascular;  Laterality: N/A;   CARDIAC CATHETERIZATION     CORONARY ANGIOPLASTY WITH STENT PLACEMENT Left 04/12/2021   LAD stent placement   CORONARY PRESSURE/FFR STUDY N/A 04/12/2021   Procedure: INTRAVASCULAR PRESSURE WIRE/FFR STUDY;  Surgeon: Mady Bruckner, MD;  Location: [facility] INVASIVE CV LAB;  Service: Cardiovascular;  Laterality: N/A;   CORONARY STENT INTERVENTION N/A 04/12/2021   Procedure: CORONARY STENT INTERVENTION;  Surgeon: Mady Bruckner, MD;  Location: [facility] INVASIVE CV LAB;  Service: Cardiovascular;  Laterality: N/A;   CORONARY  ULTRASOUND/IVUS N/A 04/12/2021   Procedure: Intravascular Ultrasound/IVUS;  Surgeon: Mady Bruckner, MD;  Location: [facility] INVASIVE CV LAB;  Service: Cardiovascular;  Laterality: N/A;   CORONARY/GRAFT ACUTE MI REVASCULARIZATION N/A 10/04/2020   Procedure: Coronary/Graft Acute MI Revascularization;  Surgeon: Mady Bruckner, MD;  Location: [facility] INVASIVE CV LAB;  Service: Cardiovascular;  Laterality: N/A;   HERNIA REPAIR     LEFT HEART CATH AND CORONARY ANGIOGRAPHY N/A 10/04/2020   Procedure: LEFT HEART CATH AND CORONARY ANGIOGRAPHY;  Surgeon: Mady Bruckner, MD;  Location: [facility] INVASIVE CV LAB;  Service: Cardiovascular;  Laterality: N/A;   RIGHT/LEFT HEART CATH AND CORONARY ANGIOGRAPHY N/A 04/12/2021   Procedure: RIGHT/LEFT HEART CATH AND CORONARY ANGIOGRAPHY;  Surgeon: Mady Bruckner, MD;  Location: [facility] INVASIVE CV LAB;  Service: Cardiovascular;  Laterality: N/A;   Patient Active Problem List   Diagnosis Date Noted   Anemia    Leukocytosis    Right arm weakness 01/25/2022   Right sided weakness    Fall at home 01/22/2022   Status post biventricular pacemaker - MRI compatible PPM and leads 01/22/2022   Chronic indwelling Foley catheter 01/22/2022   CKD (chronic kidney disease), stage III - IV (HCC) 06/28/2021   Chronic HFrEF (heart failure with reduced ejection fraction) (HCC) 03/31/2021   Arrhythmia    DM (diabetes mellitus), type 2 (HCC) new 10/26/2020   Urinary retention 10/26/2020   Hyperlipidemia LDL goal <70 10/26/2020   Coronary artery disease of native artery of native heart with stable angina pectoris 10/26/2020   S/P angioplasty with stent 10/04/20 DES to LCX  10/26/2020   Ischemic cardiomyopathy 10/26/2020   Diabetes mellitus, type 2 (HCC) 10/06/2020   DOE (dyspnea on exertion)    Meningioma (HCC) 08/14/2020   Vestibular schwannoma (HCC) 08/14/2020   Cardiac arrest with ventricular fibrillation (HCC)    Pain of left hip joint 05/29/2020   Benign brain tumor (HCC)    Chronic  kidney disease    History of pulmonary embolus (PE)    Hypertension    Nonsustained ventricular tachycardia (HCC) 10/11/2019   Dizziness 10/11/2019   Abnormal echocardiogram 06/07/2019   Eustachian tube dysfunction 08/24/2018   Conductive hearing loss of both ears 08/24/2018   Pulmonary hypertension due to thromboembolism (HCC) 03/09/2018   Solitary pulmonary nodule on lung CT 03/08/2018   Ascending aortic aneurysm (HCC) 4.2 cm based on CT from December 2020 01/29/2018   Sinus bradycardia 12/22/2017   Left bundle branch block 12/19/2017   Personal history of pulmonary embolism - no longer on anticoagulation due to pt refusal to take it anymore. 12/19/2017   Chronic kidney disease, stage III (moderate) (HCC) 12/19/2017   Benign neoplasm of brain (HCC) 12/19/2017    ONSET DATE: chronic, recent fall Jul 01 2024  REFERRING DIAG: unsteady  gait, falls  THERAPY DIAG:  Unsteadiness on feet  Difficulty in walking, not elsewhere classified  Repeated falls  Muscle weakness (generalized)  Other lack of coordination  Rationale for Evaluation and Treatment: Rehabilitation  SUBJECTIVE:                                                                                                                                                                                             SUBJECTIVE STATEMENT:  08/28/24: really sore / pain lower legs after Monday's session.  Think it was doing the toe raises with my feet on the ledge./  still pretty sore today.  Pt accompanied by: self  PERTINENT HISTORY: chronic balance deficits,   PAIN:  Are you having pain? 1-2/10 low back PRECAUTIONS: Fall and ICD/Pacemaker  RED FLAGS: None   WEIGHT BEARING RESTRICTIONS: No  FALLS: Has patient fallen in last 6 months? Yes. Number of falls one   LIVING ENVIRONMENT: Lives with: lives with their family and lives with their spouse Lives in: House/apartment  Has following equipment at home: Single point cane  and Environmental Consultant - 2 wheeled  PLOF: Independent with household mobility with device  PATIENT GOALS: improve overall strength  OBJECTIVE:  Note: Objective measures were completed at Evaluation unless otherwise noted.  DIAGNOSTIC FINDINGS: na  COGNITION: Overall cognitive status: Within functional limits for tasks assessed   SENSATION: WFL  COORDINATION: Diminished B  EDEMA:  None noted  POSTURE: tall , lean, wide base of support  LOWER EXTREMITY ROM:   wfl   LOWER EXTREMITY MMT:    MMT Right Eval Left Eval  Hip flexion 4- 4-  Hip extension 4- 4-  Hip abduction    Hip adduction    Hip internal rotation    Hip external rotation    Knee flexion    Knee extension 4- 4-  Ankle dorsiflexion 4-/5   Ankle plantarflexion    Ankle inversion    Ankle eversion    (Blank rows = not tested)  GAIT:walked 600' in 3 min 33 sec.  Consistent R foot drop and high steppage gait on R.  Frequent sway with gait and with turns  FUNCTIONAL TESTS:  5 times sit to stand: 14.66 Berg balance score 20/56 PATIENT SURVEYS:  LEFS  TREATMENT DATE:  08/28/24: Nustep L 5 x 6 min In ll bars for standing on airex alt toe taps to orange cone 15 x 2 Hamstrings curls 35# 2 x 10  Leg ext 10# 2 x 10 Standing 3 way hip with 1 1/2 # x 20 each direction in ll bars High marches with 2 1/2# in ll bars 4 square one block cw and ccw stepping  Sit to stand feet on airex from hi/low mat 10x 2 Recumbent cycle L 4.5 x 5 min   1215/25 NuStep L 5x6 min 5 x sit to stand with UE, 14.02 sec HS curls 35lb 2x10 Leg Ext 10lb 2x10 6in step ups 1 rail x10 each Lateral step ups 6in x 10 each 1 rail In //bar reaching towards called out numbers  On airex  Sit to stand Le on airex mat elevated 2x10 Alt 4in box taps 2x10 SBA Heel raises     08/21/24 Sit to stands from elevated nat  2x10  Mod cues to keep LE from pushing against mat Nustep L 5 x 6 min Alt 4 inch and 6 inch box taps x10 each 30 lb resisted forward & backwards gait x4 each  Instability with backwards gait min assit LOB x1 6in step up 1 rail x10 6in lateral step up 1 rail x10  08/19/24:  Knee flexion 45# 3 x 10 Knee ext 10 # 3 x 10 Standing 3 way hip, 10 reps, alt hip abd, flex, and ext with st knee, 1 1/2 cuff wts each ankle, needed CGA for balance with hip flexion, knees buckled at times Nustep L 5 x 6 min Leg press 40# , 10 x 2 feet level, 2 x 10 feet in staggered position each leg forward In ll bars for airex taps to 3 sea urchins, alt feet In ll bars in semi tandem standing, leading foot on 4 step, rotational reaches with red medibar, 15 x each leg In ll bars , leading foot on center of compliant side, low amplitude lunges forward, 15 x each leg, hands on ll bars, emphasis on short , controlled, low amplitude movements  08/15/24 Nustep L 5 HEP [employer] BALANCE PACKET- some with ankle wts 3# SL kicks 3 way. Marching fwd and back,side stepping, grapevine, tandem gait and stance, SLS , toe walking and heel walking Knee ext 10# 2 sets 10 HS curl 35# 2 sets 10 Leg Press 40# 3 sets 10 -seat #9 STS on airex CGA 10 x Resisted gait 30# 5 x fwd and backward, 4 x each side  08/13/24 Nustep L 5 - almost lost balance when getting off Nustep Knee ext 10# 2 sets 10 HS curl 25# 2 sets 10 Leg Press 40# 3 sets 10 ,seat number 9 if lower unable to push up, some mild RT knee pain STS on airex from elevated mat -CGA 10 x Ball toss on airex-CGA, struggled with high balls Steps ups 4 inch on airex 10 x fwd each leg then laterally 10 x each leg CG- min  A with UE support Resisted gait 30# 5 x fwd and backward, 4 x each side // bars side stepping on foam beam and tandem with UE needed 5 x each 30#   07/31/24:   Evaluation Leg press 40# 2 x 15   PATIENT EDUCATION: 08/15/24- preformed and issued HO of  [facility] Program- instructed to do by solid surface ie counter top or hallway for support and he VU. Okayed 3 ankle wts for SL  kicks and marching.   Education details: POC, goals Person educated: Patient Education method: Explanation, Demonstration, Tactile cues, Verbal cues, and Handouts Education comprehension: verbalized understanding and returned demonstration  HOME EXERCISE PROGRAM:TBD   GOALS: Goals reviewed with patient? Yes  SHORT TERM GOALS: Target date: 2 weeks 08/14/24  I HEP  Baseline: Goal status: 08/15/24 MET  LONG TERM GOALS: Target date: 10/23/24, 12 weeks   Berg balance score  improve from 20/56 to 36/56 or greater Baseline:  Goal status: INITIAL  2.  5 x sit to stand improve from 14.66 with UE support, to less than 14 sec, without B knee pain, quite painful today   Baseline:  Goal status: Progressing 08/26/24  3.  Improve LE strength, hip extension and B quads to 4+/5  Baseline: 4-/5 Goal status: INITIAL    ASSESSMENT:  CLINICAL IMPRESSION: 08/28/24: returned for balance and stability training. He has more pain today lower legs so we tried to address his knee and hip musculature and utilized the recumbent cardiovascular equipment to flush his legs some to help with delayed onset muscle soreness. Continues with instability especially with head movements with gait. He should continue to benefit from ongoing skilled PT.     Patient is a 78 y.o. male who was evaluated today by physical therapy due to unsteady gait with falls . He demonstrates R foot drop, and poor static and dynamic standing balance.  Also weakness throughout B hips , LE's which influences his safety and ease of transitional movements. He should benefit from physical therapy for LE strengthening and balance training. Needs to utilize his cane when out of house which he states he does, however did not have with him today.  OBJECTIVE IMPAIRMENTS: decreased activity tolerance, decreased balance,  decreased coordination, decreased endurance, decreased knowledge of condition, decreased knowledge of use of DME, difficulty walking, decreased strength, impaired perceived functional ability, improper body mechanics, and postural dysfunction.   ACTIVITY LIMITATIONS: carrying, lifting, bending, standing, squatting, stairs, transfers, bed mobility, and locomotion level  PARTICIPATION LIMITATIONS: meal prep, cleaning, laundry, driving, shopping, and community activity  PERSONAL FACTORS: Age, Behavior pattern, and Fitness are also affecting patient's functional outcome.   REHAB POTENTIAL: Fair    CLINICAL DECISION MAKING: Evolving/moderate complexity  EVALUATION COMPLEXITY: Moderate  PLAN:  PT FREQUENCY: 2x/week  PT DURATION: 12 weeks  PLANNED INTERVENTIONS: 97110-Therapeutic exercises, 97530- Therapeutic activity, [id number]- Neuromuscular re-education, 97535- Self Care, [id number]- Manual therapy, and Patient/Family education  PLAN FOR NEXT SESSION: bulk LE strengthening, gait with st cane, standing functional strength and balance training  HEP   Agapito Hanway L Sherrilynn Gudgel, PT,DPT, OCS 08/28/2024, 1:56 PM

## 2024-09-02 ENCOUNTER — Encounter: Payer: Self-pay | Admitting: Physical Therapy

## 2024-09-02 ENCOUNTER — Ambulatory Visit: Admitting: Physical Therapy

## 2024-09-02 DIAGNOSIS — R296 Repeated falls: Secondary | ICD-10-CM

## 2024-09-02 DIAGNOSIS — R278 Other lack of coordination: Secondary | ICD-10-CM

## 2024-09-02 DIAGNOSIS — R262 Difficulty in walking, not elsewhere classified: Secondary | ICD-10-CM

## 2024-09-02 DIAGNOSIS — M6281 Muscle weakness (generalized): Secondary | ICD-10-CM

## 2024-09-02 DIAGNOSIS — R2681 Unsteadiness on feet: Secondary | ICD-10-CM | POA: Diagnosis not present

## 2024-09-02 NOTE — Therapy (Signed)
 " OUTPATIENT PHYSICAL THERAPY NEURO   Patient Name: Timothy Moran MRN: [id number] DOB:Feb 16, 1946, 78 y.o., male Today's Date: 09/02/2024   PCP: Aisha Harvey, MD REFERRING PROVIDER: same  END OF SESSION:  PT End of Session - 09/02/24 1305     Visit Number 8    Date for Recertification  10/23/24    PT Start Time 1300    PT Stop Time 1345    PT Time Calculation (min) 45 min    Activity Tolerance Patient tolerated treatment well    Behavior During Therapy [facility] for tasks assessed/performed            Past Medical History:  Diagnosis Date   Acute blood loss anemia 10/05/2020   Acute respiratory failure (HCC), hypothermia therapy, vent - extubated 10/06/20    AKI (acute kidney injury) 10/05/2020   Anoxic brain injury (HCC) 10/26/2020   Arrhythmia    Ascending aortic aneurysm 01/29/2018   a. 2019 4.3 cm by echo; b. 02/2021 Echo: Ao root 40mm.   Benign brain tumor (HCC)    Benign neoplasm of brain (HCC) 12/19/2017   Cardiac arrest with ventricular fibrillation (HCC)    Chest pain 12/04/2017   Chronic kidney disease, stage III (moderate) (HCC) 12/19/2017   CKD (chronic kidney disease), stage III - IV (HCC)    Conductive hearing loss of both ears 08/24/2018   Coronary Artery Disease    a. 09/2020 Inf STEMI complicated by cardiac arrest/PCI: LAD 50p/m, LCX 100d (2.5 x 30 Resolute Onyx DES); b. 04/2021 PCI: 04/2021 LM nl, LAD 50p/24m (2.5x30 Onyx Frontier DES), LCX 25p, 71m (RFR 0.98), 40d ISR (RFR 0.98), OM2 25, RCA mild diff dzs.   Diabetes mellitus, type 2 (HCC) 10/06/2020   10/06/20 A1C 7%   Dizziness 10/11/2019   Encounter for imaging study to confirm orogastric (OG) tube placement    Eustachian tube dysfunction 08/24/2018   Hematuria 10/26/2020   HFrEF (heart failure with reduced ejection fraction) (HCC)    a. 11/2020 Echo: EF 25-30%; b. 02/2021 Echo: EF 25-30%, glob HK. Mild LVH. Nl RV fxn. Triv AI. Ao root 40mm.   History of pulmonary embolus (PE)    HLD (hyperlipidemia)  10/26/2020   Hypertension    Iron deficiency anemia rec'd IV iron 10/26/2020   Ischemic cardiomyopathy    a. 11/2020 Echo: EF 25-30%; b. 02/2021 Echo: EF 25-30%, glob HK.   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 SABRASpirometry 03/08/2018    FEV1 3.86 (108%)  Ratio 96 s prior rx  - PET  03/13/18   Low grade c/w adenoca > rec T surgery eval     STEMI (ST elevation myocardial infarction) (HCC) 10/04/2020   Urinary retention 10/26/2020   Vestibular schwannoma (HCC) 08/14/2020   Past Surgical History:  Procedure Laterality Date   APPENDECTOMY  BIV ICD INSERTION CRT-D N/A 11/03/2021   Procedure: BIV ICD INSERTION CRT-D;  Surgeon: Inocencio Soyla Lunger, MD;  Location: [facility] INVASIVE CV LAB;  Service: Cardiovascular;  Laterality: N/A;   CARDIAC CATHETERIZATION     CORONARY ANGIOPLASTY WITH STENT PLACEMENT Left 04/12/2021   LAD stent placement   CORONARY PRESSURE/FFR STUDY N/A 04/12/2021   Procedure: INTRAVASCULAR PRESSURE WIRE/FFR STUDY;  Surgeon: Mady Bruckner, MD;  Location: [facility] INVASIVE CV LAB;  Service: Cardiovascular;  Laterality: N/A;   CORONARY STENT INTERVENTION N/A 04/12/2021   Procedure: CORONARY STENT INTERVENTION;  Surgeon: Mady Bruckner, MD;  Location: [facility] INVASIVE CV LAB;  Service: Cardiovascular;  Laterality: N/A;   CORONARY ULTRASOUND/IVUS N/A 04/12/2021    Procedure: Intravascular Ultrasound/IVUS;  Surgeon: Mady Bruckner, MD;  Location: [facility] INVASIVE CV LAB;  Service: Cardiovascular;  Laterality: N/A;   CORONARY/GRAFT ACUTE MI REVASCULARIZATION N/A 10/04/2020   Procedure: Coronary/Graft Acute MI Revascularization;  Surgeon: Mady Bruckner, MD;  Location: [facility] INVASIVE CV LAB;  Service: Cardiovascular;  Laterality: N/A;   HERNIA REPAIR     LEFT HEART CATH AND CORONARY ANGIOGRAPHY N/A 10/04/2020   Procedure: LEFT HEART CATH AND CORONARY ANGIOGRAPHY;  Surgeon: Mady Bruckner, MD;  Location: [facility] INVASIVE CV LAB;  Service: Cardiovascular;  Laterality: N/A;   RIGHT/LEFT HEART CATH AND CORONARY ANGIOGRAPHY N/A 04/12/2021   Procedure: RIGHT/LEFT HEART CATH AND CORONARY ANGIOGRAPHY;  Surgeon: Mady Bruckner, MD;  Location: [facility] INVASIVE CV LAB;  Service: Cardiovascular;  Laterality: N/A;   Patient Active Problem List   Diagnosis Date Noted   Anemia    Leukocytosis    Right arm weakness 01/25/2022   Right sided weakness    Fall at home 01/22/2022   Status post biventricular pacemaker - MRI compatible PPM and leads 01/22/2022   Chronic indwelling Foley catheter 01/22/2022   CKD (chronic kidney disease), stage III - IV (HCC) 06/28/2021   Chronic HFrEF (heart failure with reduced ejection fraction) (HCC) 03/31/2021   Arrhythmia    DM (diabetes mellitus), type 2 (HCC) new 10/26/2020   Urinary retention 10/26/2020   Hyperlipidemia LDL goal <70 10/26/2020   Coronary artery disease of native artery of native heart with stable angina pectoris 10/26/2020   S/P angioplasty with stent 10/04/20 DES to LCX  10/26/2020   Ischemic cardiomyopathy 10/26/2020   Diabetes mellitus, type 2 (HCC) 10/06/2020   DOE (dyspnea on exertion)    Meningioma (HCC) 08/14/2020   Vestibular schwannoma (HCC) 08/14/2020   Cardiac arrest with ventricular fibrillation (HCC)    Pain of left hip joint 05/29/2020   Benign brain tumor (HCC)    Chronic kidney disease    History of  pulmonary embolus (PE)    Hypertension    Nonsustained ventricular tachycardia (HCC) 10/11/2019   Dizziness 10/11/2019   Abnormal echocardiogram 06/07/2019   Eustachian tube dysfunction 08/24/2018   Conductive hearing loss of both ears 08/24/2018   Pulmonary hypertension due to thromboembolism (HCC) 03/09/2018   Solitary pulmonary nodule on lung CT 03/08/2018   Ascending aortic aneurysm (HCC) 4.2 cm based on CT from December 2020 01/29/2018   Sinus bradycardia 12/22/2017   Left bundle branch block 12/19/2017   Personal history of pulmonary embolism - no longer on anticoagulation due to pt refusal to take it anymore. 12/19/2017   Chronic kidney disease, stage III (moderate) (HCC) 12/19/2017   Benign neoplasm of brain (HCC) 12/19/2017    ONSET DATE: chronic, recent fall Jul 01 2024  REFERRING DIAG: unsteady gait, falls  THERAPY DIAG:  Unsteadiness on feet  Difficulty in walking, not elsewhere classified  Repeated falls  Muscle weakness (generalized)  Other lack of coordination  Rationale for Evaluation and Treatment: Rehabilitation  SUBJECTIVE:                                                                                                                                                                                             SUBJECTIVE STATEMENT:  My normal, no falls Pt accompanied by: self  PERTINENT HISTORY: chronic balance deficits,   PAIN:  Are you having pain? 0/10 low back PRECAUTIONS: Fall and ICD/Pacemaker  RED FLAGS: None   WEIGHT BEARING RESTRICTIONS: No  FALLS: Has patient fallen in last 6 months? Yes. Number of falls one   LIVING ENVIRONMENT: Lives with: lives with their family and lives with their spouse Lives in: House/apartment  Has following equipment at home: Single point cane and Environmental Consultant - 2 wheeled  PLOF: Independent with household mobility with device  PATIENT GOALS: improve overall strength  OBJECTIVE:  Note: Objective measures were  completed at Evaluation unless otherwise noted.  DIAGNOSTIC FINDINGS: na  COGNITION: Overall cognitive status: Within functional limits for tasks assessed   SENSATION: WFL  COORDINATION: Diminished B  EDEMA:  None noted  POSTURE: tall , lean, wide base of support  LOWER EXTREMITY ROM:   wfl   LOWER EXTREMITY MMT:    MMT Right Eval Left Eval  Hip flexion 4- 4-  Hip extension 4- 4-  Hip abduction    Hip adduction    Hip internal rotation    Hip external rotation    Knee flexion    Knee extension 4- 4-  Ankle dorsiflexion 4-/5   Ankle plantarflexion    Ankle inversion    Ankle eversion    (Blank rows = not tested)  GAIT:walked 600' in 3 min 33 sec.  Consistent R foot drop and high steppage gait on R.  Frequent sway with gait and with turns  FUNCTIONAL TESTS:  5 times sit to stand: 14.66 Berg balance score 20/56 PATIENT SURVEYS:  LEFS  TREATMENT DATE:  09/02/24 Nustep L 5 x 6 min Slant board calf stretch  Shoulder Ext 10lb 2x10 on airex Shoulder Row 15lb 2x10 on airex HS curls 35lb 2x15 Leg Ext 10lb 2x15 S2S from elevated table on airex 2x10 Alt 4 in box taps  6in lateral step ups   08/28/24: Nustep L 5 x 6 min In ll bars for standing on airex alt toe taps to [place] cone 15 x 2 Hamstrings curls 35# 2 x 10  Leg ext 10# 2 x 10 Standing 3 way hip with 1 1/2 # x 20 each direction in ll bars High marches with 2 1/2# in ll bars 4 square one block cw and ccw stepping  Sit to stand feet on airex from hi/low mat 10x 2 Recumbent cycle L 4.5 x 5 min   1215/25 NuStep L 5x6 min 5 x sit to stand with UE, 14.02 sec HS curls 35lb 2x10 Leg Ext 10lb 2x10 6in step ups 1 rail x10 each Lateral step ups 6in x 10 each 1 rail In //bar reaching towards called out numbers  On airex  Sit to stand Le on airex mat elevated 2x10 Alt 4in box taps  2x10 SBA Heel raises     08/21/24 Sit to stands from elevated nat 2x10  Mod cues to keep LE from pushing against mat Nustep L 5 x 6 min Alt 4 inch and 6 inch box taps x10 each 30 lb resisted forward & backwards gait x4 each  Instability with backwards gait min assit LOB x1 6in step up 1 rail x10 6in lateral step up 1 rail x10  08/19/24:  Knee flexion 45# 3 x 10 Knee ext 10 # 3 x 10 Standing 3 way hip, 10 reps, alt hip abd, flex, and ext with st knee, 1 1/2 cuff wts each ankle, needed CGA for balance with hip flexion, knees buckled at times Nustep L 5 x 6 min Leg press 40# , 10 x 2 feet level, 2 x 10 feet in staggered position each leg forward In ll bars for airex taps to 3 sea urchins, alt feet In ll bars in semi tandem standing, leading foot on 4 step, rotational reaches with red medibar, 15 x each leg In ll bars , leading foot on center of compliant side, low amplitude lunges forward, 15 x each leg, hands on ll bars, emphasis on short , controlled, low amplitude movements  08/15/24 Nustep L 5 HEP [employer] BALANCE PACKET- some with ankle wts 3# SL kicks 3 way. Marching fwd and back,side stepping, [place], tandem gait and stance, SLS , toe walking and heel walking Knee ext 10# 2 sets 10 HS curl 35# 2 sets 10 Leg Press 40# 3 sets 10 -seat #9 STS on airex CGA 10 x Resisted gait 30# 5 x fwd and backward, 4 x each side  08/13/24 Nustep L 5 - almost lost balance when getting off Nustep Knee ext 10# 2 sets 10 HS curl 25# 2 sets 10 Leg Press 40# 3 sets 10 ,seat number 9 if lower unable to push up, some mild RT knee pain STS on airex from elevated mat -CGA 10 x Ball toss on airex-CGA, struggled with high balls Steps ups 4 inch on airex 10 x fwd each leg then laterally 10 x each leg CG- min  A with UE support Resisted gait 30# 5 x fwd and backward, 4 x each side // bars side stepping on foam beam and tandem with UE needed 5  x each 30#   07/31/24:   Evaluation Leg press 40#  2 x 15   PATIENT EDUCATION: 08/15/24- preformed and issued HO of [facility] Balance Program- instructed to do by solid surface ie counter top or hallway for support and he VU. Okayed 3 ankle wts for SL kicks and marching.   Education details: POC, goals Person educated: Patient Education method: Explanation, Demonstration, Tactile cues, Verbal cues, and Handouts Education comprehension: verbalized understanding and returned demonstration  HOME EXERCISE PROGRAM:TBD   GOALS: Goals reviewed with patient? Yes  SHORT TERM GOALS: Target date: 2 weeks 08/14/24  I HEP  Baseline: Goal status: 08/15/24 MET  LONG TERM GOALS: Target date: 10/23/24, 12 weeks   Berg balance score  improve from 20/56 to 36/56 or greater Baseline:  Goal status: INITIAL  2.  5 x sit to stand improve from 14.66 with UE support, to less than 14 sec, without B knee pain, quite painful today   Baseline:  Goal status: Progressing 08/26/24  3.  Improve LE strength, hip extension and B quads to 4+/5  Baseline: 4-/5 Goal status: INITIAL    ASSESSMENT:  CLINICAL IMPRESSION: 09/02/24: returned for balance and stability training. Avoided direct strain on the calves. Instability noted today in airex doing rows and ext. CGA required fir alt box taps. Increase fatigue with functional interventions . Continues with instability especially with head movements with gait. He should continue to benefit from ongoing skilled PT.     Patient is a 78 y.o. male who was evaluated today by physical therapy due to unsteady gait with falls . He demonstrates R foot drop, and poor static and dynamic standing balance.  Also weakness throughout B hips , LE's which influences his safety and ease of transitional movements. He should benefit from physical therapy for LE strengthening and balance training. Needs to utilize his cane when out of house which he states he does, however did not have with him today.  OBJECTIVE IMPAIRMENTS: decreased  activity tolerance, decreased balance, decreased coordination, decreased endurance, decreased knowledge of condition, decreased knowledge of use of DME, difficulty walking, decreased strength, impaired perceived functional ability, improper body mechanics, and postural dysfunction.   ACTIVITY LIMITATIONS: carrying, lifting, bending, standing, squatting, stairs, transfers, bed mobility, and locomotion level  PARTICIPATION LIMITATIONS: meal prep, cleaning, laundry, driving, shopping, and community activity  PERSONAL FACTORS: Age, Behavior pattern, and Fitness are also affecting patient's functional outcome.   REHAB POTENTIAL: Fair    CLINICAL DECISION MAKING: Evolving/moderate complexity  EVALUATION COMPLEXITY: Moderate  PLAN:  PT FREQUENCY: 2x/week  PT DURATION: 12 weeks  PLANNED INTERVENTIONS: 97110-Therapeutic exercises, 97530- Therapeutic activity, 97112- Neuromuscular re-education, 97535- Self Care, [id number]- Manual therapy, and Patient/Family education  PLAN FOR NEXT SESSION: bulk LE strengthening, gait with st cane, standing functional strength and balance training  HEP   Tanda KANDICE Sorrow, PTA, 09/02/2024, 1:05 PM        "

## 2024-09-09 ENCOUNTER — Encounter: Payer: Self-pay | Admitting: Physical Therapy

## 2024-09-09 ENCOUNTER — Ambulatory Visit: Admitting: Physical Therapy

## 2024-09-09 DIAGNOSIS — R262 Difficulty in walking, not elsewhere classified: Secondary | ICD-10-CM

## 2024-09-09 DIAGNOSIS — R2681 Unsteadiness on feet: Secondary | ICD-10-CM

## 2024-09-09 DIAGNOSIS — R296 Repeated falls: Secondary | ICD-10-CM

## 2024-09-09 DIAGNOSIS — M6281 Muscle weakness (generalized): Secondary | ICD-10-CM

## 2024-09-09 NOTE — Therapy (Signed)
 " OUTPATIENT PHYSICAL THERAPY NEURO   Patient Name: Timothy Moran MRN: [id number] DOB:January 07, 1946, 78 y.o., male Today's Date: 09/09/2024   PCP: Aisha Harvey, MD REFERRING PROVIDER: same  END OF SESSION:  PT End of Session - 09/09/24 1059     Visit Number 9    Date for Recertification  10/23/24    PT Start Time 1058    PT Stop Time 1141    PT Time Calculation (min) 43 min    Activity Tolerance Patient tolerated treatment well    Behavior During Therapy [facility] for tasks assessed/performed            Past Medical History:  Diagnosis Date   Acute blood loss anemia 10/05/2020   Acute respiratory failure (HCC), hypothermia therapy, vent - extubated 10/06/20    AKI (acute kidney injury) 10/05/2020   Anoxic brain injury (HCC) 10/26/2020   Arrhythmia    Ascending aortic aneurysm 01/29/2018   a. 2019 4.3 cm by echo; b. 02/2021 Echo: Ao root 40mm.   Benign brain tumor (HCC)    Benign neoplasm of brain (HCC) 12/19/2017   Cardiac arrest with ventricular fibrillation (HCC)    Chest pain 12/04/2017   Chronic kidney disease, stage III (moderate) (HCC) 12/19/2017   CKD (chronic kidney disease), stage III - IV (HCC)    Conductive hearing loss of both ears 08/24/2018   Coronary Artery Disease    a. 09/2020 Inf STEMI complicated by cardiac arrest/PCI: LAD 50p/m, LCX 100d (2.5 x 30 Resolute Onyx DES); b. 04/2021 PCI: 04/2021 LM nl, LAD 50p/64m (2.5x30 Onyx Frontier DES), LCX 25p, 31m (RFR 0.98), 40d ISR (RFR 0.98), OM2 25, RCA mild diff dzs.   Diabetes mellitus, type 2 (HCC) 10/06/2020   10/06/20 A1C 7%   Dizziness 10/11/2019   Encounter for imaging study to confirm orogastric (OG) tube placement    Eustachian tube dysfunction 08/24/2018   Hematuria 10/26/2020   HFrEF (heart failure with reduced ejection fraction) (HCC)    a. 11/2020 Echo: EF 25-30%; b. 02/2021 Echo: EF 25-30%, glob HK. Mild LVH. Nl RV fxn. Triv AI. Ao root 40mm.   History of pulmonary embolus (PE)    HLD (hyperlipidemia)  10/26/2020   Hypertension    Iron deficiency anemia rec'd IV iron 10/26/2020   Ischemic cardiomyopathy    a. 11/2020 Echo: EF 25-30%; b. 02/2021 Echo: EF 25-30%, glob HK.   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 SABRASpirometry 03/08/2018    FEV1 3.86 (108%)  Ratio 96 s prior rx  - PET  03/13/18   Low grade c/w adenoca > rec T surgery eval     STEMI (ST elevation myocardial infarction) (HCC) 10/04/2020   Urinary retention 10/26/2020   Vestibular schwannoma (HCC) 08/14/2020   Past Surgical History:  Procedure Laterality Date   APPENDECTOMY  BIV ICD INSERTION CRT-D N/A 11/03/2021   Procedure: BIV ICD INSERTION CRT-D;  Surgeon: Inocencio Soyla Lunger, MD;  Location: [facility] INVASIVE CV LAB;  Service: Cardiovascular;  Laterality: N/A;   CARDIAC CATHETERIZATION     CORONARY ANGIOPLASTY WITH STENT PLACEMENT Left 04/12/2021   LAD stent placement   CORONARY PRESSURE/FFR STUDY N/A 04/12/2021   Procedure: INTRAVASCULAR PRESSURE WIRE/FFR STUDY;  Surgeon: Mady Bruckner, MD;  Location: [facility] INVASIVE CV LAB;  Service: Cardiovascular;  Laterality: N/A;   CORONARY STENT INTERVENTION N/A 04/12/2021   Procedure: CORONARY STENT INTERVENTION;  Surgeon: Mady Bruckner, MD;  Location: [facility] INVASIVE CV LAB;  Service: Cardiovascular;  Laterality: N/A;   CORONARY ULTRASOUND/IVUS N/A 04/12/2021    Procedure: Intravascular Ultrasound/IVUS;  Surgeon: Mady Bruckner, MD;  Location: [facility] INVASIVE CV LAB;  Service: Cardiovascular;  Laterality: N/A;   CORONARY/GRAFT ACUTE MI REVASCULARIZATION N/A 10/04/2020   Procedure: Coronary/Graft Acute MI Revascularization;  Surgeon: Mady Bruckner, MD;  Location: [facility] INVASIVE CV LAB;  Service: Cardiovascular;  Laterality: N/A;   HERNIA REPAIR     LEFT HEART CATH AND CORONARY ANGIOGRAPHY N/A 10/04/2020   Procedure: LEFT HEART CATH AND CORONARY ANGIOGRAPHY;  Surgeon: Mady Bruckner, MD;  Location: [facility] INVASIVE CV LAB;  Service: Cardiovascular;  Laterality: N/A;   RIGHT/LEFT HEART CATH AND CORONARY ANGIOGRAPHY N/A 04/12/2021   Procedure: RIGHT/LEFT HEART CATH AND CORONARY ANGIOGRAPHY;  Surgeon: Mady Bruckner, MD;  Location: [facility] INVASIVE CV LAB;  Service: Cardiovascular;  Laterality: N/A;   Patient Active Problem List   Diagnosis Date Noted   Anemia    Leukocytosis    Right arm weakness 01/25/2022   Right sided weakness    Fall at home 01/22/2022   Status post biventricular pacemaker - MRI compatible PPM and leads 01/22/2022   Chronic indwelling Foley catheter 01/22/2022   CKD (chronic kidney disease), stage III - IV (HCC) 06/28/2021   Chronic HFrEF (heart failure with reduced ejection fraction) (HCC) 03/31/2021   Arrhythmia    DM (diabetes mellitus), type 2 (HCC) new 10/26/2020   Urinary retention 10/26/2020   Hyperlipidemia LDL goal <70 10/26/2020   Coronary artery disease of native artery of native heart with stable angina pectoris 10/26/2020   S/P angioplasty with stent 10/04/20 DES to LCX  10/26/2020   Ischemic cardiomyopathy 10/26/2020   Diabetes mellitus, type 2 (HCC) 10/06/2020   DOE (dyspnea on exertion)    Meningioma (HCC) 08/14/2020   Vestibular schwannoma (HCC) 08/14/2020   Cardiac arrest with ventricular fibrillation (HCC)    Pain of left hip joint 05/29/2020   Benign brain tumor (HCC)    Chronic kidney disease    History of  pulmonary embolus (PE)    Hypertension    Nonsustained ventricular tachycardia (HCC) 10/11/2019   Dizziness 10/11/2019   Abnormal echocardiogram 06/07/2019   Eustachian tube dysfunction 08/24/2018   Conductive hearing loss of both ears 08/24/2018   Pulmonary hypertension due to thromboembolism (HCC) 03/09/2018   Solitary pulmonary nodule on lung CT 03/08/2018   Ascending aortic aneurysm (HCC) 4.2 cm based on CT from December 2020 01/29/2018   Sinus bradycardia 12/22/2017   Left bundle branch block 12/19/2017   Personal history of pulmonary embolism - no longer on anticoagulation due to pt refusal to take it anymore. 12/19/2017   Chronic kidney disease, stage III (moderate) (HCC) 12/19/2017   Benign neoplasm of brain (HCC) 12/19/2017    ONSET DATE: chronic, recent fall Jul 01 2024  REFERRING DIAG: unsteady gait, falls  THERAPY DIAG:  Unsteadiness on feet  Difficulty in walking, not elsewhere classified  Muscle weakness (generalized)  Repeated falls  Rationale for Evaluation and Treatment: Rehabilitation  SUBJECTIVE:                                                                                                                                                                                             SUBJECTIVE STATEMENT:  Not too bad  Pt accompanied by: self  PERTINENT HISTORY: chronic balance deficits,   PAIN:  Are you having pain? 0/10 low back PRECAUTIONS: Fall and ICD/Pacemaker  RED FLAGS: None   WEIGHT BEARING RESTRICTIONS: No  FALLS: Has patient fallen in last 6 months? Yes. Number of falls one   LIVING ENVIRONMENT: Lives with: lives with their family and lives with their spouse Lives in: House/apartment  Has following equipment at home: Single point cane and Environmental Consultant - 2 wheeled  PLOF: Independent with household mobility with device  PATIENT GOALS: improve overall strength  OBJECTIVE:  Note: Objective measures were completed at Evaluation unless  otherwise noted.  DIAGNOSTIC FINDINGS: na  COGNITION: Overall cognitive status: Within functional limits for tasks assessed   SENSATION: WFL  COORDINATION: Diminished B  EDEMA:  None noted  POSTURE: tall , lean, wide base of support  LOWER EXTREMITY ROM:   wfl   LOWER EXTREMITY MMT:    MMT Right Eval Left Eval  Hip flexion 4- 4-  Hip extension 4- 4-  Hip abduction    Hip adduction    Hip internal rotation    Hip external rotation    Knee flexion    Knee extension 4- 4-  Ankle dorsiflexion 4-/5   Ankle plantarflexion    Ankle inversion    Ankle eversion    (Blank rows = not tested)  GAIT:walked 600' in 3 min 33 sec.  Consistent R foot drop and high steppage gait on R.  Frequent sway with gait and with turns  FUNCTIONAL TESTS:  5 times sit to stand: 14.66 Berg balance score 20/56 PATIENT SURVEYS:  LEFS  TREATMENT DATE:  09/09/24 Nustep L 5 x 6 min BERG 23/56 S2S from elevated table on airex 2x10 Backwards walking  HS curls 35lb 2x10 Leg Ext 15lb 2x15 6in step ups w/ UE support x10 Heel raises 2x10 from floor  09/02/24 Nustep L 5 x 6 min Slant board calf stretch  Shoulder Ext 10lb 2x10 on airex Shoulder Row 15lb 2x10 on airex HS curls 35lb 2x15 Leg Ext 10lb 2x15 S2S from elevated table on airex 2x10 Alt 4 in box taps  6in lateral step ups   08/28/24: Nustep L 5 x 6 min In ll bars for standing on airex alt toe taps to orange cone 15 x 2 Hamstrings curls 35# 2 x 10  Leg ext 10# 2 x 10 Standing 3 way hip with 1 1/2 # x 20 each direction in ll bars High marches with 2 1/2# in ll bars 4 square one block cw and ccw stepping  Sit to stand feet on airex from hi/low mat 10x 2 Recumbent cycle L 4.5 x 5 min   1215/25 NuStep L 5x6 min 5 x sit to stand with UE, 14.02 sec HS curls 35lb 2x10 Leg Ext 10lb 2x10 6in step ups 1  rail x10 each Lateral step ups 6in x 10 each 1 rail In //bar reaching towards called out numbers  On airex  Sit to stand Le on airex mat elevated 2x10 Alt 4in box taps 2x10 SBA Heel raises     08/21/24 Sit to stands from elevated nat 2x10  Mod cues to keep LE from pushing against mat Nustep L 5 x 6 min Alt 4 inch and 6 inch box taps x10 each 30 lb resisted forward & backwards gait x4 each  Instability with backwards gait min assit LOB x1 6in step up 1 rail x10 6in lateral step up 1 rail x10  08/19/24:  Knee flexion 45# 3 x 10 Knee ext 10 # 3 x 10 Standing 3 way hip, 10 reps, alt hip abd, flex, and ext with st knee, 1 1/2 cuff wts each ankle, needed CGA for balance with hip flexion, knees buckled at times Nustep L 5 x 6 min Leg press 40# , 10 x 2 feet level, 2 x 10 feet in staggered position each leg forward In ll bars for airex taps to 3 sea urchins, alt feet In ll bars in semi tandem standing, leading foot on 4 step, rotational reaches with red medibar, 15 x each leg In ll bars , leading foot on center of compliant side, low amplitude lunges forward, 15 x each leg, hands on ll bars, emphasis on short , controlled, low amplitude movements  08/15/24 Nustep L 5 HEP [employer] BALANCE PACKET- some with ankle wts 3# SL kicks 3 way. Marching fwd and back,side stepping, [place], tandem gait and stance, SLS , toe walking and heel walking Knee ext 10# 2 sets 10 HS curl 35# 2 sets 10 Leg Press 40# 3 sets 10 -seat #9 STS on airex CGA 10 x Resisted gait 30# 5 x fwd and backward, 4 x each side  08/13/24 Nustep L 5 - almost lost balance when getting off Nustep Knee ext 10# 2 sets 10 HS curl 25# 2 sets 10 Leg Press 40# 3 sets 10 ,seat number 9 if lower unable to push up, some mild RT knee pain STS on airex from elevated mat -CGA 10 x Ball toss on airex-CGA, struggled with high balls Steps ups 4 inch on airex 10 x fwd  each leg then laterally 10 x each leg CG- min  A with UE  support Resisted gait 30# 5 x fwd and backward, 4 x each side // bars side stepping on foam beam and tandem with UE needed 5 x each 30#   07/31/24:   Evaluation Leg press 40# 2 x 15   PATIENT EDUCATION: 08/15/24- preformed and issued HO of [facility] Balance Program- instructed to do by solid surface ie counter top or hallway for support and he VU. Okayed 3 ankle wts for SL kicks and marching.   Education details: POC, goals Person educated: Patient Education method: Explanation, Demonstration, Tactile cues, Verbal cues, and Handouts Education comprehension: verbalized understanding and returned demonstration  HOME EXERCISE PROGRAM:TBD   GOALS: Goals reviewed with patient? Yes  SHORT TERM GOALS: Target date: 2 weeks 08/14/24  I HEP  Baseline: Goal status: 08/15/24 MET  LONG TERM GOALS: Target date: 10/23/24, 12 weeks   Berg balance score  improve from 20/56 to 36/56 or greater Baseline:  Goal status: INITIAL  2.  5 x sit to stand improve from 14.66 with UE support, to less than 14 sec, without B knee pain, quite painful today   Baseline:  Goal status: Progressing 08/26/24  3.  Improve LE strength, hip extension and B quads to 4+/5  Baseline: 4-/5 Goal status: INITIAL    ASSESSMENT:  CLINICAL IMPRESSION: Returned for balance and stability training. Slight improvement made with BERG but he remains a fall risk.  Increase fatigue with functional interventions.  SBA required during entire session.  He should continue to benefit from ongoing skilled PT.     Patient is a 78 y.o. male who was evaluated today by physical therapy due to unsteady gait with falls . He demonstrates R foot drop, and poor static and dynamic standing balance.  Also weakness throughout B hips , LE's which influences his safety and ease of transitional movements. He should benefit from physical therapy for LE strengthening and balance training. Needs to utilize his cane when out of house which he states he  does, however did not have with him today.  OBJECTIVE IMPAIRMENTS: decreased activity tolerance, decreased balance, decreased coordination, decreased endurance, decreased knowledge of condition, decreased knowledge of use of DME, difficulty walking, decreased strength, impaired perceived functional ability, improper body mechanics, and postural dysfunction.   ACTIVITY LIMITATIONS: carrying, lifting, bending, standing, squatting, stairs, transfers, bed mobility, and locomotion level  PARTICIPATION LIMITATIONS: meal prep, cleaning, laundry, driving, shopping, and community activity  PERSONAL FACTORS: Age, Behavior pattern, and Fitness are also affecting patient's functional outcome.   REHAB POTENTIAL: Fair    CLINICAL DECISION MAKING: Evolving/moderate complexity  EVALUATION COMPLEXITY: Moderate  PLAN:  PT FREQUENCY: 2x/week  PT DURATION: 12 weeks  PLANNED INTERVENTIONS: 97110-Therapeutic exercises, 97530- Therapeutic activity, 97112- Neuromuscular re-education, 97535- Self Care, [id number]- Manual therapy, and Patient/Family education  PLAN FOR NEXT SESSION: bulk LE strengthening, gait with st cane, standing functional strength and balance training  HEP   Tanda KANDICE Sorrow, PTA, 09/09/2024, 11:00 AM        "

## 2024-09-11 ENCOUNTER — Ambulatory Visit: Admitting: Physical Therapy

## 2024-09-12 ENCOUNTER — Other Ambulatory Visit: Payer: Self-pay | Admitting: Cardiology

## 2024-09-16 ENCOUNTER — Encounter: Payer: Self-pay | Admitting: Physical Therapy

## 2024-09-16 ENCOUNTER — Ambulatory Visit: Attending: Family Medicine | Admitting: Physical Therapy

## 2024-09-16 DIAGNOSIS — R262 Difficulty in walking, not elsewhere classified: Secondary | ICD-10-CM | POA: Diagnosis present

## 2024-09-16 DIAGNOSIS — R2681 Unsteadiness on feet: Secondary | ICD-10-CM | POA: Insufficient documentation

## 2024-09-16 DIAGNOSIS — R296 Repeated falls: Secondary | ICD-10-CM | POA: Insufficient documentation

## 2024-09-16 DIAGNOSIS — R278 Other lack of coordination: Secondary | ICD-10-CM | POA: Insufficient documentation

## 2024-09-16 DIAGNOSIS — M6281 Muscle weakness (generalized): Secondary | ICD-10-CM | POA: Diagnosis present

## 2024-09-16 NOTE — Therapy (Signed)
 " OUTPATIENT PHYSICAL THERAPY NEURO   Patient Name: Timothy Moran MRN: [id number] DOB:06/16/1946, 79 y.o., male Today's Date: 09/16/2024   PCP: Aisha Harvey, MD REFERRING PROVIDER: same  END OF SESSION:  PT End of Session - 09/16/24 1143     Visit Number 10    Date for Recertification  10/23/24    PT Start Time 1145    PT Stop Time 1230    PT Time Calculation (min) 45 min    Activity Tolerance Patient tolerated treatment well    Behavior During Therapy [facility] for tasks assessed/performed            Past Medical History:  Diagnosis Date   Acute blood loss anemia 10/05/2020   Acute respiratory failure (HCC), hypothermia therapy, vent - extubated 10/06/20    AKI (acute kidney injury) 10/05/2020   Anoxic brain injury (HCC) 10/26/2020   Arrhythmia    Ascending aortic aneurysm 01/29/2018   a. 2019 4.3 cm by echo; b. 02/2021 Echo: Ao root 40mm.   Benign brain tumor (HCC)    Benign neoplasm of brain (HCC) 12/19/2017   Cardiac arrest with ventricular fibrillation (HCC)    Chest pain 12/04/2017   Chronic kidney disease, stage III (moderate) (HCC) 12/19/2017   CKD (chronic kidney disease), stage III - IV (HCC)    Conductive hearing loss of both ears 08/24/2018   Coronary Artery Disease    a. 09/2020 Inf STEMI complicated by cardiac arrest/PCI: LAD 50p/m, LCX 100d (2.5 x 30 Resolute Onyx DES); b. 04/2021 PCI: 04/2021 LM nl, LAD 50p/31m (2.5x30 Onyx Frontier DES), LCX 25p, 8m (RFR 0.98), 40d ISR (RFR 0.98), OM2 25, RCA mild diff dzs.   Diabetes mellitus, type 2 (HCC) 10/06/2020   10/06/20 A1C 7%   Dizziness 10/11/2019   Encounter for imaging study to confirm orogastric (OG) tube placement    Eustachian tube dysfunction 08/24/2018   Hematuria 10/26/2020   HFrEF (heart failure with reduced ejection fraction) (HCC)    a. 11/2020 Echo: EF 25-30%; b. 02/2021 Echo: EF 25-30%, glob HK. Mild LVH. Nl RV fxn. Triv AI. Ao root 40mm.   History of pulmonary embolus (PE)    HLD (hyperlipidemia)  10/26/2020   Hypertension    Iron deficiency anemia rec'd IV iron 10/26/2020   Ischemic cardiomyopathy    a. 11/2020 Echo: EF 25-30%; b. 02/2021 Echo: EF 25-30%, glob HK.   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 SABRASpirometry 03/08/2018    FEV1 3.86 (108%)  Ratio 96 s prior rx  - PET  03/13/18   Low grade c/w adenoca > rec T surgery eval     STEMI (ST elevation myocardial infarction) (HCC) 10/04/2020   Urinary retention 10/26/2020   Vestibular schwannoma (HCC) 08/14/2020   Past Surgical History:  Procedure Laterality Date   APPENDECTOMY  BIV ICD INSERTION CRT-D N/A 11/03/2021   Procedure: BIV ICD INSERTION CRT-D;  Surgeon: Inocencio Soyla Lunger, MD;  Location: [facility] INVASIVE CV LAB;  Service: Cardiovascular;  Laterality: N/A;   CARDIAC CATHETERIZATION     CORONARY ANGIOPLASTY WITH STENT PLACEMENT Left 04/12/2021   LAD stent placement   CORONARY PRESSURE/FFR STUDY N/A 04/12/2021   Procedure: INTRAVASCULAR PRESSURE WIRE/FFR STUDY;  Surgeon: Mady Bruckner, MD;  Location: [facility] INVASIVE CV LAB;  Service: Cardiovascular;  Laterality: N/A;   CORONARY STENT INTERVENTION N/A 04/12/2021   Procedure: CORONARY STENT INTERVENTION;  Surgeon: Mady Bruckner, MD;  Location: [facility] INVASIVE CV LAB;  Service: Cardiovascular;  Laterality: N/A;   CORONARY ULTRASOUND/IVUS N/A 04/12/2021    Procedure: Intravascular Ultrasound/IVUS;  Surgeon: Mady Bruckner, MD;  Location: [facility] INVASIVE CV LAB;  Service: Cardiovascular;  Laterality: N/A;   CORONARY/GRAFT ACUTE MI REVASCULARIZATION N/A 10/04/2020   Procedure: Coronary/Graft Acute MI Revascularization;  Surgeon: Mady Bruckner, MD;  Location: [facility] INVASIVE CV LAB;  Service: Cardiovascular;  Laterality: N/A;   HERNIA REPAIR     LEFT HEART CATH AND CORONARY ANGIOGRAPHY N/A 10/04/2020   Procedure: LEFT HEART CATH AND CORONARY ANGIOGRAPHY;  Surgeon: Mady Bruckner, MD;  Location: [facility] INVASIVE CV LAB;  Service: Cardiovascular;  Laterality: N/A;   RIGHT/LEFT HEART CATH AND CORONARY ANGIOGRAPHY N/A 04/12/2021   Procedure: RIGHT/LEFT HEART CATH AND CORONARY ANGIOGRAPHY;  Surgeon: Mady Bruckner, MD;  Location: [facility] INVASIVE CV LAB;  Service: Cardiovascular;  Laterality: N/A;   Patient Active Problem List   Diagnosis Date Noted   Anemia    Leukocytosis    Right arm weakness 01/25/2022   Right sided weakness    Fall at home 01/22/2022   Status post biventricular pacemaker - MRI compatible PPM and leads 01/22/2022   Chronic indwelling Foley catheter 01/22/2022   CKD (chronic kidney disease), stage III - IV (HCC) 06/28/2021   Chronic HFrEF (heart failure with reduced ejection fraction) (HCC) 03/31/2021   Arrhythmia    DM (diabetes mellitus), type 2 (HCC) new 10/26/2020   Urinary retention 10/26/2020   Hyperlipidemia LDL goal <70 10/26/2020   Coronary artery disease of native artery of native heart with stable angina pectoris 10/26/2020   S/P angioplasty with stent 10/04/20 DES to LCX  10/26/2020   Ischemic cardiomyopathy 10/26/2020   Diabetes mellitus, type 2 (HCC) 10/06/2020   DOE (dyspnea on exertion)    Meningioma (HCC) 08/14/2020   Vestibular schwannoma (HCC) 08/14/2020   Cardiac arrest with ventricular fibrillation (HCC)    Pain of left hip joint 05/29/2020   Benign brain tumor (HCC)    Chronic kidney disease    History of  pulmonary embolus (PE)    Hypertension    Nonsustained ventricular tachycardia (HCC) 10/11/2019   Dizziness 10/11/2019   Abnormal echocardiogram 06/07/2019   Eustachian tube dysfunction 08/24/2018   Conductive hearing loss of both ears 08/24/2018   Pulmonary hypertension due to thromboembolism (HCC) 03/09/2018   Solitary pulmonary nodule on lung CT 03/08/2018   Ascending aortic aneurysm (HCC) 4.2 cm based on CT from December 2020 01/29/2018   Sinus bradycardia 12/22/2017   Left bundle branch block 12/19/2017   Personal history of pulmonary embolism - no longer on anticoagulation due to pt refusal to take it anymore. 12/19/2017   Chronic kidney disease, stage III (moderate) (HCC) 12/19/2017   Benign neoplasm of brain (HCC) 12/19/2017    ONSET DATE: chronic, recent fall Jul 01 2024  REFERRING DIAG: unsteady gait, falls  THERAPY DIAG:  Unsteadiness on feet  Difficulty in walking, not elsewhere classified  Muscle weakness (generalized)  Repeated falls  Rationale for Evaluation and Treatment: Rehabilitation  SUBJECTIVE:                                                                                                                                                                                             SUBJECTIVE STATEMENT:  A little less steady  Pt accompanied by: self  PERTINENT HISTORY: chronic balance deficits,   PAIN:  Are you having pain? 0/10 low back PRECAUTIONS: Fall and ICD/Pacemaker  RED FLAGS: None   WEIGHT BEARING RESTRICTIONS: No  FALLS: Has patient fallen in last 6 months? Yes. Number of falls one   LIVING ENVIRONMENT: Lives with: lives with their family and lives with their spouse Lives in: House/apartment  Has following equipment at home: Single point cane and Environmental Consultant - 2 wheeled  PLOF: Independent with household mobility with device  PATIENT GOALS: improve overall strength  OBJECTIVE:  Note: Objective measures were completed at Evaluation  unless otherwise noted.  DIAGNOSTIC FINDINGS: na  COGNITION: Overall cognitive status: Within functional limits for tasks assessed   SENSATION: WFL  COORDINATION: Diminished B  EDEMA:  None noted  POSTURE: tall , lean, wide base of support  LOWER EXTREMITY ROM:   wfl   LOWER EXTREMITY MMT:    MMT Right Eval Left Eval Right 09/17/23 Left 09/17/23  Hip flexion 4- 4- 4- 4  Hip extension 4- 4- 4 4  Hip abduction      Hip adduction      Hip internal rotation      Hip external rotation      Knee flexion      Knee extension 4- 4- 5 5  Ankle dorsiflexion 4-/5  4 5   Ankle plantarflexion      Ankle inversion      Ankle eversion      (Blank rows = not tested)  GAIT:walked 600' in 3 min 33 sec.  Consistent R foot drop and high steppage gait on R.  Frequent sway with gait and with turns  FUNCTIONAL TESTS:  5 times sit to stand: 14.66 Berg balance score 20/56 PATIENT SURVEYS:  LEFS  TREATMENT DATE:  09/17/23 NuStep L 5 x 6 min HS curls 35lb 2x15 Leg Ext 15lb 2x15 S2S w/ OHP yellow ball from elevated table on airex 3x5  X5 without airex 6in step ups 1 raisl x 10 each Side steps in // bars  On balance beam with U support.   09/09/24 Nustep L 5 x 6 min BERG 23/56 S2S from elevated table on airex 2x10 Backwards walking  HS curls 35lb 2x10 Leg Ext 15lb 2x15 6in step ups w/ UE support x10 Heel raises 2x10 from floor  09/02/24 Nustep L 5 x 6 min Slant board calf stretch  Shoulder Ext 10lb 2x10 on airex Shoulder Row 15lb 2x10 on airex HS curls 35lb 2x15 Leg Ext 10lb 2x15 S2S from elevated table on airex 2x10 Alt 4 in box taps  6in lateral step ups   08/28/24: Nustep L 5 x 6 min In ll bars for standing on airex alt toe taps to orange cone 15 x 2 Hamstrings curls 35# 2 x 10  Leg ext 10# 2 x 10 Standing 3 way hip with 1 1/2 # x 20 each  direction in ll bars High marches with 2 1/2# in ll bars 4 square one block cw and ccw stepping  Sit to stand feet on airex from hi/low mat 10x 2 Recumbent cycle L 4.5 x 5 min   1215/25 NuStep L 5x6 min 5 x sit to stand with UE, 14.02 sec HS curls 35lb 2x10 Leg Ext 10lb 2x10 6in step ups 1 rail x10 each Lateral step ups 6in x 10 each 1 rail In //bar reaching towards called out numbers  On airex  Sit to stand Le on airex mat elevated 2x10 Alt 4in box taps 2x10 SBA Heel raises     08/21/24 Sit to stands from elevated nat 2x10  Mod cues to keep LE from pushing against mat Nustep L 5 x 6 min Alt 4 inch and 6 inch box taps x10 each 30 lb resisted forward & backwards gait x4 each  Instability with backwards gait min assit LOB x1 6in step up 1 rail x10 6in lateral step up 1 rail x10  08/19/24:  Knee flexion 45# 3 x 10 Knee ext 10 # 3 x 10 Standing 3 way hip, 10 reps, alt hip abd, flex, and ext with st knee, 1 1/2 cuff wts each ankle, needed CGA for balance with hip flexion, knees buckled at times Nustep L 5 x 6 min Leg press 40# , 10 x 2 feet level, 2 x 10 feet in staggered position each leg forward In ll bars for airex taps to 3 sea urchins, alt feet In ll bars in semi tandem standing, leading foot on 4 step, rotational reaches with red medibar, 15 x each leg In ll bars , leading foot on center of compliant side, low amplitude lunges forward, 15 x each leg, hands on ll bars, emphasis on short , controlled, low amplitude movements  08/15/24 Nustep L 5 HEP [facility] BALANCE PACKET- some with ankle wts 3# SL kicks 3 way. Marching fwd and back,side stepping, grapevine, tandem gait and stance, SLS , toe walking and heel walking Knee ext 10# 2 sets 10 HS curl 35# 2 sets 10 Leg Press 40# 3 sets 10 -seat #9 STS on airex CGA 10 x Resisted gait 30# 5 x fwd and backward, 4 x each side  08/13/24 Nustep L 5 - almost lost balance when getting off Nustep Knee ext 10# 2 sets 10  HS  curl 25# 2 sets 10 Leg Press 40# 3 sets 10 ,seat number 9 if lower unable to push up, some mild RT knee pain STS on airex from elevated mat -CGA 10 x Ball toss on airex-CGA, struggled with high balls Steps ups 4 inch on airex 10 x fwd each leg then laterally 10 x each leg CG- min  A with UE support Resisted gait 30# 5 x fwd and backward, 4 x each side // bars side stepping on foam beam and tandem with UE needed 5 x each 30#   07/31/24:   Evaluation Leg press 40# 2 x 15   PATIENT EDUCATION: 08/15/24- preformed and issued HO of [facility] Balance Program- instructed to do by solid surface ie counter top or hallway for support and he VU. Okayed 3 ankle wts for SL kicks and marching.   Education details: POC, goals Person educated: Patient Education method: Explanation, Demonstration, Tactile cues, Verbal cues, and Handouts Education comprehension: verbalized understanding and returned demonstration  HOME EXERCISE PROGRAM:TBD   GOALS: Goals reviewed with patient? Yes  SHORT TERM GOALS: Target date: 2 weeks 08/14/24  I HEP  Baseline: Goal status: 08/15/24 MET  LONG TERM GOALS: Target date: 10/23/24, 12 weeks   Berg balance score  improve from 20/56 to 36/56 or greater Baseline:  Goal status: INITIAL  2.  5 x sit to stand improve from 14.66 with UE support, to less than 14 sec, without B knee pain, quite painful today   Baseline:  Goal status: Progressing 08/26/24  3.  Improve LE strength, hip extension and B quads to 4+/5  Baseline: 4-/5 Goal status: Progressing 09/17/23    ASSESSMENT:  CLINICAL IMPRESSION: Returned for balance and stability training. He has progressed increasing his LE strength. BERG taken lasr session revealing pt is a a fall risk. Increase fatigue with functional interventions remains. SBA required during entire session. CGA needed with sit to stands on airex He should continue to benefit from ongoing skilled PT.     Patient is a 79 y.o. male who was  evaluated today by physical therapy due to unsteady gait with falls . He demonstrates R foot drop, and poor static and dynamic standing balance.  Also weakness throughout B hips , LE's which influences his safety and ease of transitional movements. He should benefit from physical therapy for LE strengthening and balance training. Needs to utilize his cane when out of house which he states he does, however did not have with him today.  OBJECTIVE IMPAIRMENTS: decreased activity tolerance, decreased balance, decreased coordination, decreased endurance, decreased knowledge of condition, decreased knowledge of use of DME, difficulty walking, decreased strength, impaired perceived functional ability, improper body mechanics, and postural dysfunction.   ACTIVITY LIMITATIONS: carrying, lifting, bending, standing, squatting, stairs, transfers, bed mobility, and locomotion level  PARTICIPATION LIMITATIONS: meal prep, cleaning, laundry, driving, shopping, and community activity  PERSONAL FACTORS: Age, Behavior pattern, and Fitness are also affecting patient's functional outcome.   REHAB POTENTIAL: Fair    CLINICAL DECISION MAKING: Evolving/moderate complexity  EVALUATION COMPLEXITY: Moderate  PLAN:  PT FREQUENCY: 2x/week  PT DURATION: 12 weeks  PLANNED INTERVENTIONS: 97110-Therapeutic exercises, 97530- Therapeutic activity, 97112- Neuromuscular re-education, 97535- Self Care, [id number]- Manual therapy, and Patient/Family education  PLAN FOR NEXT SESSION: bulk LE strengthening, gait with st cane, standing functional strength and balance training  HEP  I agree with the above 10th visit assessment and goals,   Amy L Speaks, PT, DPT Board-certified Specialist in Orthopaedic Physical Therapy  Tanda KANDICE Sorrow, PTA, 09/16/2024, 11:44 AM        "

## 2024-09-18 ENCOUNTER — Ambulatory Visit

## 2024-09-18 ENCOUNTER — Other Ambulatory Visit: Payer: Self-pay

## 2024-09-18 DIAGNOSIS — R296 Repeated falls: Secondary | ICD-10-CM

## 2024-09-18 DIAGNOSIS — R278 Other lack of coordination: Secondary | ICD-10-CM

## 2024-09-18 DIAGNOSIS — R2681 Unsteadiness on feet: Secondary | ICD-10-CM

## 2024-09-18 DIAGNOSIS — M6281 Muscle weakness (generalized): Secondary | ICD-10-CM

## 2024-09-18 DIAGNOSIS — R262 Difficulty in walking, not elsewhere classified: Secondary | ICD-10-CM

## 2024-09-18 NOTE — Therapy (Signed)
 " OUTPATIENT PHYSICAL THERAPY NEURO   Patient Name: Timothy Moran MRN: [id number] DOB:04-Apr-1946, 79 y.o., male Today's Date: 09/18/2024   PCP: Aisha Harvey, MD REFERRING PROVIDER: same  END OF SESSION:  PT End of Session - 09/18/24 1102     Visit Number 11    Date for Recertification  10/23/24    Progress Note Due on Visit 10    PT Start Time 1100    PT Stop Time 1140    PT Time Calculation (min) 40 min    Activity Tolerance Patient tolerated treatment well    Behavior During Therapy [facility] for tasks assessed/performed             Past Medical History:  Diagnosis Date   Acute blood loss anemia 10/05/2020   Acute respiratory failure (HCC), hypothermia therapy, vent - extubated 10/06/20    AKI (acute kidney injury) 10/05/2020   Anoxic brain injury (HCC) 10/26/2020   Arrhythmia    Ascending aortic aneurysm 01/29/2018   a. 2019 4.3 cm by echo; b. 02/2021 Echo: Ao root 40mm.   Benign brain tumor (HCC)    Benign neoplasm of brain (HCC) 12/19/2017   Cardiac arrest with ventricular fibrillation (HCC)    Chest pain 12/04/2017   Chronic kidney disease, stage III (moderate) (HCC) 12/19/2017   CKD (chronic kidney disease), stage III - IV (HCC)    Conductive hearing loss of both ears 08/24/2018   Coronary Artery Disease    a. 09/2020 Inf STEMI complicated by cardiac arrest/PCI: LAD 50p/m, LCX 100d (2.5 x 30 Resolute Onyx DES); b. 04/2021 PCI: 04/2021 LM nl, LAD 50p/34m (2.5x30 Onyx Frontier DES), LCX 25p, 78m (RFR 0.98), 40d ISR (RFR 0.98), OM2 25, RCA mild diff dzs.   Diabetes mellitus, type 2 (HCC) 10/06/2020   10/06/20 A1C 7%   Dizziness 10/11/2019   Encounter for imaging study to confirm orogastric (OG) tube placement    Eustachian tube dysfunction 08/24/2018   Hematuria 10/26/2020   HFrEF (heart failure with reduced ejection fraction) (HCC)    a. 11/2020 Echo: EF 25-30%; b. 02/2021 Echo: EF 25-30%, glob HK. Mild LVH. Nl RV fxn. Triv AI. Ao root 40mm.   History of pulmonary  embolus (PE)    HLD (hyperlipidemia) 10/26/2020   Hypertension    Iron deficiency anemia rec'd IV iron 10/26/2020   Ischemic cardiomyopathy    a. 11/2020 Echo: EF 25-30%; b. 02/2021 Echo: EF 25-30%, glob HK.   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 SABRASpirometry 03/08/2018    FEV1 3.86 (108%)  Ratio 96 s prior rx  - PET  03/13/18   Low grade c/w adenoca > rec T surgery eval     STEMI (ST elevation myocardial infarction) (HCC) 10/04/2020   Urinary retention 10/26/2020   Vestibular schwannoma (HCC) 08/14/2020   Past Surgical  History:  Procedure Laterality Date   APPENDECTOMY     BIV ICD INSERTION CRT-D N/A 11/03/2021   Procedure: BIV ICD INSERTION CRT-D;  Surgeon: Inocencio Soyla Lunger, MD;  Location: [facility] INVASIVE CV LAB;  Service: Cardiovascular;  Laterality: N/A;   CARDIAC CATHETERIZATION     CORONARY ANGIOPLASTY WITH STENT PLACEMENT Left 04/12/2021   LAD stent placement   CORONARY PRESSURE/FFR STUDY N/A 04/12/2021   Procedure: INTRAVASCULAR PRESSURE WIRE/FFR STUDY;  Surgeon: Mady Bruckner, MD;  Location: [facility] INVASIVE CV LAB;  Service: Cardiovascular;  Laterality: N/A;   CORONARY STENT INTERVENTION N/A 04/12/2021   Procedure: CORONARY STENT INTERVENTION;  Surgeon: Mady Bruckner, MD;  Location: [facility] INVASIVE CV LAB;  Service: Cardiovascular;  Laterality: N/A;   CORONARY  ULTRASOUND/IVUS N/A 04/12/2021   Procedure: Intravascular Ultrasound/IVUS;  Surgeon: Mady Bruckner, MD;  Location: [facility] INVASIVE CV LAB;  Service: Cardiovascular;  Laterality: N/A;   CORONARY/GRAFT ACUTE MI REVASCULARIZATION N/A 10/04/2020   Procedure: Coronary/Graft Acute MI Revascularization;  Surgeon: Mady Bruckner, MD;  Location: [facility] INVASIVE CV LAB;  Service: Cardiovascular;  Laterality: N/A;   HERNIA REPAIR     LEFT HEART CATH AND CORONARY ANGIOGRAPHY N/A 10/04/2020   Procedure: LEFT HEART CATH AND CORONARY ANGIOGRAPHY;  Surgeon: Mady Bruckner, MD;  Location: [facility] INVASIVE CV LAB;  Service: Cardiovascular;  Laterality: N/A;   RIGHT/LEFT HEART CATH AND CORONARY ANGIOGRAPHY N/A 04/12/2021   Procedure: RIGHT/LEFT HEART CATH AND CORONARY ANGIOGRAPHY;  Surgeon: Mady Bruckner, MD;  Location: [facility] INVASIVE CV LAB;  Service: Cardiovascular;  Laterality: N/A;   Patient Active Problem List   Diagnosis Date Noted   Anemia    Leukocytosis    Right arm weakness 01/25/2022   Right sided weakness    Fall at home 01/22/2022   Status post biventricular pacemaker - MRI compatible PPM and leads 01/22/2022   Chronic indwelling Foley catheter 01/22/2022   CKD (chronic kidney disease), stage III - IV (HCC) 06/28/2021   Chronic HFrEF (heart failure with reduced ejection fraction) (HCC) 03/31/2021   Arrhythmia    DM (diabetes mellitus), type 2 (HCC) new 10/26/2020   Urinary retention 10/26/2020   Hyperlipidemia LDL goal <70 10/26/2020   Coronary artery disease of native artery of native heart with stable angina pectoris 10/26/2020   S/P angioplasty with stent 10/04/20 DES to LCX  10/26/2020   Ischemic cardiomyopathy 10/26/2020   Diabetes mellitus, type 2 (HCC) 10/06/2020   DOE (dyspnea on exertion)    Meningioma (HCC) 08/14/2020   Vestibular schwannoma (HCC) 08/14/2020   Cardiac arrest with ventricular fibrillation (HCC)    Pain of left hip joint 05/29/2020   Benign brain tumor (HCC)    Chronic  kidney disease    History of pulmonary embolus (PE)    Hypertension    Nonsustained ventricular tachycardia (HCC) 10/11/2019   Dizziness 10/11/2019   Abnormal echocardiogram 06/07/2019   Eustachian tube dysfunction 08/24/2018   Conductive hearing loss of both ears 08/24/2018   Pulmonary hypertension due to thromboembolism (HCC) 03/09/2018   Solitary pulmonary nodule on lung CT 03/08/2018   Ascending aortic aneurysm (HCC) 4.2 cm based on CT from December 2020 01/29/2018   Sinus bradycardia 12/22/2017   Left bundle branch block 12/19/2017   Personal history of pulmonary embolism - no longer on anticoagulation due to pt refusal to take it anymore. 12/19/2017   Chronic kidney disease, stage III (moderate) (HCC) 12/19/2017   Benign neoplasm of brain (HCC) 12/19/2017    ONSET DATE: chronic, recent fall Jul 01 2024  REFERRING  DIAG: unsteady gait, falls  THERAPY DIAG:  Difficulty in walking, not elsewhere classified  Unsteadiness on feet  Muscle weakness (generalized)  Repeated falls  Other lack of coordination  Rationale for Evaluation and Treatment: Rehabilitation  SUBJECTIVE:                                                                                                                                                                                             SUBJECTIVE STATEMENT: I'm not sore, more unsteady, need to improve my balance   Pt accompanied by: self  PERTINENT HISTORY: chronic balance deficits,   PAIN:  Are you having pain? 0/10 low back PRECAUTIONS: Fall and ICD/Pacemaker  RED FLAGS: None   WEIGHT BEARING RESTRICTIONS: No  FALLS: Has patient fallen in last 6 months? Yes. Number of falls one   LIVING ENVIRONMENT: Lives with: lives with their family and lives with their spouse Lives in: House/apartment  Has following equipment at home: Single point cane and Environmental Consultant - 2 wheeled  PLOF: Independent with household mobility with device  PATIENT GOALS:  improve overall strength  OBJECTIVE:  Note: Objective measures were completed at Evaluation unless otherwise noted.  DIAGNOSTIC FINDINGS: na  COGNITION: Overall cognitive status: Within functional limits for tasks assessed   SENSATION: WFL  COORDINATION: Diminished B  EDEMA:  None noted  POSTURE: tall , lean, wide base of support  LOWER EXTREMITY ROM:   wfl   LOWER EXTREMITY MMT:    MMT Right Eval Left Eval Right 09/17/23 Left 09/17/23  Hip flexion 4- 4- 4- 4  Hip extension 4- 4- 4 4  Hip abduction      Hip adduction      Hip internal rotation      Hip external rotation      Knee flexion      Knee extension 4- 4- 5 5  Ankle dorsiflexion 4-/5  4 5   Ankle plantarflexion      Ankle inversion      Ankle eversion      (Blank rows = not tested)  GAIT:walked 600' in 3 min 33 sec.  Consistent R foot drop and high steppage gait on R.  Frequent sway with gait and with turns  FUNCTIONAL TESTS:  5 times sit to stand: 14.66 Berg balance score 20/56 PATIENT SURVEYS:  LEFS  TREATMENT DATE:  09/18/24: HS curls 35lb 2x15 Leg Ext 15lb 2x15 In ll bars for staggered standing, with 4# hand weights, for alt arm swings, large amplitude to challenge balance Sit to stand from elevated mat table feet on airex, 10 x 2 In ll bars standing on 2 step for star taps each foot, 10 x each  Nustep L 5 x 6 min Ue's and LE's  3 way hip:  2# cuff wts each ankle mass practice in ll bars Lateral step ups to 6 step with hand rail mass practice each leg   09/17/23 NuStep L 5 x 6 min HS curls 35lb 2x15 Leg Ext 15lb 2x15 S2S w/ OHP yellow ball from elevated table on airex 3x5  X5 without airex 6in step ups 1 raisl x 10 each Side steps in // bars  On balance beam with U support.   09/09/24 Nustep L 5 x 6 min BERG 23/56 S2S from elevated table on airex 2x10 Backwards  walking  HS curls 35lb 2x10 Leg Ext 15lb 2x15 6in step ups w/ UE support x10 Heel raises 2x10 from floor  09/02/24 Nustep L 5 x 6 min Slant board calf stretch  Shoulder Ext 10lb 2x10 on airex Shoulder Row 15lb 2x10 on airex HS curls 35lb 2x15 Leg Ext 10lb 2x15 S2S from elevated table on airex 2x10 Alt 4 in box taps  6in lateral step ups   08/28/24: Nustep L 5 x 6 min In ll bars for standing on airex alt toe taps to orange cone 15 x 2 Hamstrings curls 35# 2 x 10  Leg ext 10# 2 x 10 Standing 3 way hip with 1 1/2 # x 20 each direction in ll bars High marches with 2 1/2# in ll bars 4 square one block cw and ccw stepping  Sit to stand feet on airex from hi/low mat 10x 2 Recumbent cycle L 4.5 x 5 min   1215/25 NuStep L 5x6 min 5 x sit to stand with UE, 14.02 sec HS curls 35lb 2x10 Leg Ext 10lb 2x10 6in step ups 1 rail x10 each Lateral step ups 6in x 10 each 1 rail In //bar reaching towards called out numbers  On airex  Sit to stand Le on airex mat elevated 2x10 Alt 4in box taps 2x10 SBA Heel raises     08/21/24 Sit to stands from elevated nat 2x10  Mod cues to keep LE from pushing against mat Nustep L 5 x 6 min Alt 4 inch and 6 inch box taps x10 each 30 lb resisted forward & backwards gait x4 each  Instability with backwards gait min assit LOB x1 6in step up 1 rail x10 6in lateral step up 1 rail x10  08/19/24:  Knee flexion 45# 3 x 10 Knee ext 10 # 3 x 10 Standing 3 way hip, 10 reps, alt hip abd, flex, and ext with st knee, 1 1/2 cuff wts each ankle, needed CGA for balance with hip flexion, knees buckled at times Nustep L 5 x 6 min Leg press 40# , 10 x 2 feet level, 2 x 10 feet in staggered position each leg forward In ll bars for airex taps to 3 sea urchins, alt feet In ll bars in semi tandem standing, leading foot on 4 step, rotational reaches with red medibar, 15 x each leg In ll bars , leading foot on center of compliant side, low amplitude lunges forward,  15 x each leg, hands on ll bars, emphasis on short ,  controlled, low amplitude movements  08/15/24 Nustep L 5 HEP [employer] BALANCE PACKET- some with ankle wts 3# SL kicks 3 way. Marching fwd and back,side stepping, grapevine, tandem gait and stance, SLS , toe walking and heel walking Knee ext 10# 2 sets 10 HS curl 35# 2 sets 10 Leg Press 40# 3 sets 10 -seat #9 STS on airex CGA 10 x Resisted gait 30# 5 x fwd and backward, 4 x each side  08/13/24 Nustep L 5 - almost lost balance when getting off Nustep Knee ext 10# 2 sets 10 HS curl 25# 2 sets 10 Leg Press 40# 3 sets 10 ,seat number 9 if lower unable to push up, some mild RT knee pain STS on airex from elevated mat -CGA 10 x Ball toss on airex-CGA, struggled with high balls Steps ups 4 inch on airex 10 x fwd each leg then laterally 10 x each leg CG- min  A with UE support Resisted gait 30# 5 x fwd and backward, 4 x each side // bars side stepping on foam beam and tandem with UE needed 5 x each 30#   07/31/24:   Evaluation Leg press 40# 2 x 15   PATIENT EDUCATION: 08/15/24- preformed and issued HO of [facility] Balance Program- instructed to do by solid surface ie counter top or hallway for support and he VU. Okayed 3 ankle wts for SL kicks and marching.   Education details: POC, goals Person educated: Patient Education method: Explanation, Demonstration, Tactile cues, Verbal cues, and Handouts Education comprehension: verbalized understanding and returned demonstration  HOME EXERCISE PROGRAM:TBD   GOALS: Goals reviewed with patient? Yes  SHORT TERM GOALS: Target date: 2 weeks 08/14/24  I HEP  Baseline: Goal status: 08/15/24 MET  LONG TERM GOALS: Target date: 10/23/24, 12 weeks   Berg balance score  improve from 20/56 to 36/56 or greater Baseline:  Goal status: INITIAL  2.  5 x sit to stand improve from 14.66 with UE support, to less than 14 sec, without B knee pain, quite painful today   Baseline:  Goal status:  Progressing 08/26/24  3.  Improve LE strength, hip extension and B quads to 4+/5  Baseline: 4-/5 Goal status: Progressing 09/17/23    ASSESSMENT:  CLINICAL IMPRESSION: Focused on B hip and trunk stability and balance challenges.  Very unsteady with initial standing. High steppage gait pattern. He worked hard, fatigued relatively quickly.   He should continue to benefit from ongoing skilled PT.     Patient is a 79 y.o. male who was evaluated today by physical therapy due to unsteady gait with falls . He demonstrates R foot drop, and poor static and dynamic standing balance.  Also weakness throughout B hips , LE's which influences his safety and ease of transitional movements. He should benefit from physical therapy for LE strengthening and balance training. Needs to utilize his cane when out of house which he states he does, however did not have with him today.  OBJECTIVE IMPAIRMENTS: decreased activity tolerance, decreased balance, decreased coordination, decreased endurance, decreased knowledge of condition, decreased knowledge of use of DME, difficulty walking, decreased strength, impaired perceived functional ability, improper body mechanics, and postural dysfunction.   ACTIVITY LIMITATIONS: carrying, lifting, bending, standing, squatting, stairs, transfers, bed mobility, and locomotion level  PARTICIPATION LIMITATIONS: meal prep, cleaning, laundry, driving, shopping, and community activity  PERSONAL FACTORS: Age, Behavior pattern, and Fitness are also affecting patient's functional outcome.   REHAB POTENTIAL: Fair    CLINICAL DECISION MAKING: Evolving/moderate  complexity  EVALUATION COMPLEXITY: Moderate  PLAN:  PT FREQUENCY: 2x/week  PT DURATION: 12 weeks  PLANNED INTERVENTIONS: 97110-Therapeutic exercises, 97530- Therapeutic activity, 97112- Neuromuscular re-education, 97535- Self Care, [id number]- Manual therapy, and Patient/Family education  PLAN FOR NEXT SESSION: bulk LE  strengthening, gait with st cane, standing functional strength and balance training  HEP  Bart Ashford L Marissa Lowrey, PT,DPT, OCS 09/18/2024, 2:31 PM        "

## 2024-09-23 ENCOUNTER — Encounter: Payer: Self-pay | Admitting: Physical Therapy

## 2024-09-23 ENCOUNTER — Ambulatory Visit: Admitting: Physical Therapy

## 2024-09-23 DIAGNOSIS — M6281 Muscle weakness (generalized): Secondary | ICD-10-CM

## 2024-09-23 DIAGNOSIS — R262 Difficulty in walking, not elsewhere classified: Secondary | ICD-10-CM

## 2024-09-23 DIAGNOSIS — R296 Repeated falls: Secondary | ICD-10-CM

## 2024-09-23 DIAGNOSIS — R2681 Unsteadiness on feet: Secondary | ICD-10-CM

## 2024-09-23 NOTE — Therapy (Signed)
 " OUTPATIENT PHYSICAL THERAPY NEURO   Patient Name: Timothy Moran MRN: [id number] DOB:1946-07-13, 79 y.o., male Today's Date: 09/23/2024   PCP: Aisha Harvey, MD REFERRING PROVIDER: same  END OF SESSION:  PT End of Session - 09/23/24 1140     Visit Number 12    Date for Recertification  10/23/24    PT Start Time 1140    PT Stop Time 1225    PT Time Calculation (min) 45 min    Activity Tolerance Patient tolerated treatment well    Behavior During Therapy [facility] for tasks assessed/performed             Past Medical History:  Diagnosis Date   Acute blood loss anemia 10/05/2020   Acute respiratory failure (HCC), hypothermia therapy, vent - extubated 10/06/20    AKI (acute kidney injury) 10/05/2020   Anoxic brain injury (HCC) 10/26/2020   Arrhythmia    Ascending aortic aneurysm 01/29/2018   a. 2019 4.3 cm by echo; b. 02/2021 Echo: Ao root 40mm.   Benign brain tumor (HCC)    Benign neoplasm of brain (HCC) 12/19/2017   Cardiac arrest with ventricular fibrillation (HCC)    Chest pain 12/04/2017   Chronic kidney disease, stage III (moderate) (HCC) 12/19/2017   CKD (chronic kidney disease), stage III - IV (HCC)    Conductive hearing loss of both ears 08/24/2018   Coronary Artery Disease    a. 09/2020 Inf STEMI complicated by cardiac arrest/PCI: LAD 50p/m, LCX 100d (2.5 x 30 Resolute Onyx DES); b. 04/2021 PCI: 04/2021 LM nl, LAD 50p/46m (2.5x30 Onyx Frontier DES), LCX 25p, 68m (RFR 0.98), 40d ISR (RFR 0.98), OM2 25, RCA mild diff dzs.   Diabetes mellitus, type 2 (HCC) 10/06/2020   10/06/20 A1C 7%   Dizziness 10/11/2019   Encounter for imaging study to confirm orogastric (OG) tube placement    Eustachian tube dysfunction 08/24/2018   Hematuria 10/26/2020   HFrEF (heart failure with reduced ejection fraction) (HCC)    a. 11/2020 Echo: EF 25-30%; b. 02/2021 Echo: EF 25-30%, glob HK. Mild LVH. Nl RV fxn. Triv AI. Ao root 40mm.   History of pulmonary embolus (PE)    HLD  (hyperlipidemia) 10/26/2020   Hypertension    Iron deficiency anemia rec'd IV iron 10/26/2020   Ischemic cardiomyopathy    a. 11/2020 Echo: EF 25-30%; b. 02/2021 Echo: EF 25-30%, glob HK.   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 SABRASpirometry 03/08/2018    FEV1 3.86 (108%)  Ratio 96 s prior rx  - PET  03/13/18   Low grade c/w adenoca > rec T surgery eval     STEMI (ST elevation myocardial infarction) (HCC) 10/04/2020   Urinary retention 10/26/2020   Vestibular schwannoma (HCC) 08/14/2020   Past Surgical History:  Procedure Laterality Date   APPENDECTOMY  BIV ICD INSERTION CRT-D N/A 11/03/2021   Procedure: BIV ICD INSERTION CRT-D;  Surgeon: Inocencio Soyla Lunger, MD;  Location: [facility] INVASIVE CV LAB;  Service: Cardiovascular;  Laterality: N/A;   CARDIAC CATHETERIZATION     CORONARY ANGIOPLASTY WITH STENT PLACEMENT Left 04/12/2021   LAD stent placement   CORONARY PRESSURE/FFR STUDY N/A 04/12/2021   Procedure: INTRAVASCULAR PRESSURE WIRE/FFR STUDY;  Surgeon: Mady Bruckner, MD;  Location: [facility] INVASIVE CV LAB;  Service: Cardiovascular;  Laterality: N/A;   CORONARY STENT INTERVENTION N/A 04/12/2021   Procedure: CORONARY STENT INTERVENTION;  Surgeon: Mady Bruckner, MD;  Location: [facility] INVASIVE CV LAB;  Service: Cardiovascular;  Laterality: N/A;   CORONARY ULTRASOUND/IVUS N/A  04/12/2021   Procedure: Intravascular Ultrasound/IVUS;  Surgeon: Mady Bruckner, MD;  Location: [facility] INVASIVE CV LAB;  Service: Cardiovascular;  Laterality: N/A;   CORONARY/GRAFT ACUTE MI REVASCULARIZATION N/A 10/04/2020   Procedure: Coronary/Graft Acute MI Revascularization;  Surgeon: Mady Bruckner, MD;  Location: [facility] INVASIVE CV LAB;  Service: Cardiovascular;  Laterality: N/A;   HERNIA REPAIR     LEFT HEART CATH AND CORONARY ANGIOGRAPHY N/A 10/04/2020   Procedure: LEFT HEART CATH AND CORONARY ANGIOGRAPHY;  Surgeon: Mady Bruckner, MD;  Location: [facility] INVASIVE CV LAB;  Service: Cardiovascular;  Laterality: N/A;   RIGHT/LEFT HEART CATH AND CORONARY ANGIOGRAPHY N/A 04/12/2021   Procedure: RIGHT/LEFT HEART CATH AND CORONARY ANGIOGRAPHY;  Surgeon: Mady Bruckner, MD;  Location: [facility] INVASIVE CV LAB;  Service: Cardiovascular;  Laterality: N/A;   Patient Active Problem List   Diagnosis Date Noted   Anemia    Leukocytosis    Right arm weakness 01/25/2022   Right sided weakness    Fall at home 01/22/2022   Status post biventricular pacemaker - MRI compatible PPM and leads 01/22/2022   Chronic indwelling Foley catheter 01/22/2022   CKD (chronic kidney disease), stage III - IV (HCC) 06/28/2021   Chronic HFrEF (heart failure with reduced ejection fraction) (HCC) 03/31/2021   Arrhythmia    DM (diabetes mellitus), type 2 (HCC) new 10/26/2020   Urinary retention 10/26/2020   Hyperlipidemia LDL goal <70 10/26/2020   Coronary artery disease of native artery of native heart with stable angina pectoris 10/26/2020   S/P angioplasty with stent 10/04/20 DES to LCX  10/26/2020   Ischemic cardiomyopathy 10/26/2020   Diabetes mellitus, type 2 (HCC) 10/06/2020   DOE (dyspnea on exertion)    Meningioma (HCC) 08/14/2020   Vestibular schwannoma (HCC) 08/14/2020   Cardiac arrest with ventricular fibrillation (HCC)    Pain of left hip joint 05/29/2020   Benign brain tumor (HCC)    Chronic kidney disease     History of pulmonary embolus (PE)    Hypertension    Nonsustained ventricular tachycardia (HCC) 10/11/2019   Dizziness 10/11/2019   Abnormal echocardiogram 06/07/2019   Eustachian tube dysfunction 08/24/2018   Conductive hearing loss of both ears 08/24/2018   Pulmonary hypertension due to thromboembolism (HCC) 03/09/2018   Solitary pulmonary nodule on lung CT 03/08/2018   Ascending aortic aneurysm (HCC) 4.2 cm based on CT from December 2020 01/29/2018   Sinus bradycardia 12/22/2017   Left bundle branch block 12/19/2017   Personal history of pulmonary embolism - no longer on anticoagulation due to pt refusal to take it anymore. 12/19/2017   Chronic kidney disease, stage III (moderate) (HCC) 12/19/2017   Benign neoplasm of brain (HCC) 12/19/2017    ONSET DATE: chronic, recent fall Jul 01 2024  REFERRING DIAG: unsteady gait, falls  THERAPY DIAG:  Difficulty in walking, not  elsewhere classified  Unsteadiness on feet  Muscle weakness (generalized)  Repeated falls  Rationale for Evaluation and Treatment: Rehabilitation  SUBJECTIVE:                                                                                                                                                                                             SUBJECTIVE STATEMENT: Doing ok, about the same  Pt accompanied by: self  PERTINENT HISTORY: chronic balance deficits,   PAIN:  Are you having pain? 0/10 low back PRECAUTIONS: Fall and ICD/Pacemaker  RED FLAGS: None   WEIGHT BEARING RESTRICTIONS: No  FALLS: Has patient fallen in last 6 months? Yes. Number of falls one   LIVING ENVIRONMENT: Lives with: lives with their family and lives with their spouse Lives in: House/apartment  Has following equipment at home: Single point cane and Environmental Consultant - 2 wheeled  PLOF: Independent with household mobility with device  PATIENT GOALS: improve overall strength  OBJECTIVE:  Note: Objective measures were completed at  Evaluation unless otherwise noted.  DIAGNOSTIC FINDINGS: na  COGNITION: Overall cognitive status: Within functional limits for tasks assessed   SENSATION: WFL  COORDINATION: Diminished B  EDEMA:  None noted  POSTURE: tall , lean, wide base of support  LOWER EXTREMITY ROM:   wfl   LOWER EXTREMITY MMT:    MMT Right Eval Left Eval Right 09/17/23 Left 09/17/23  Hip flexion 4- 4- 4- 4  Hip extension 4- 4- 4 4  Hip abduction      Hip adduction      Hip internal rotation      Hip external rotation      Knee flexion      Knee extension 4- 4- 5 5  Ankle dorsiflexion 4-/5  4 5   Ankle plantarflexion      Ankle inversion      Ankle eversion      (Blank rows = not tested)  GAIT:walked 600' in 3 min 33 sec.  Consistent R foot drop and high steppage gait on R.  Frequent sway with gait and with turns  FUNCTIONAL TESTS:  5 times sit to stand: 14.66 Berg balance score 20/56 PATIENT SURVEYS:  LEFS  TREATMENT DATE:  09/24/23 NuStep L 5 x 6 min 5x sit to stand (Mat elevated) 15.31 sec In // bars  Side stap over tband with UE  Alt cone taps  Tabem walking with UE Leg press 40lb 2x10 working form lower ROM HS curls 35lb 2x12 Leg Ext 10lb 2x10  09/18/24: HS curls 35lb 2x15 Leg Ext 15lb 2x15 In ll bars for staggered standing, with 4# hand weights, for alt arm swings, large amplitude to challenge balance Sit to stand from elevated mat table feet on airex, 10 x 2 In ll bars standing on 2 step for star taps each foot, 10 x each  Nustep L 5 x 6 min Ue's and LE's  3 way hip:  2# cuff wts each ankle mass practice in ll bars Lateral step ups to 6 step with hand rail mass practice each leg   09/17/23 NuStep L 5 x 6 min HS curls 35lb 2x15 Leg Ext 15lb 2x15 S2S w/ OHP yellow ball from elevated table on airex 3x5  X5 without airex 6in step ups 1 raisl x 10  each Side steps in // bars  On balance beam with U support.   09/09/24 Nustep L 5 x 6 min BERG 23/56 S2S from elevated table on airex 2x10 Backwards walking  HS curls 35lb 2x10 Leg Ext 15lb 2x15 6in step ups w/ UE support x10 Heel raises 2x10 from floor  09/02/24 Nustep L 5 x 6 min Slant board calf stretch  Shoulder Ext 10lb 2x10 on airex Shoulder Row 15lb 2x10 on airex HS curls 35lb 2x15 Leg Ext 10lb 2x15 S2S from elevated table on airex 2x10 Alt 4 in box taps  6in lateral step ups   08/28/24: Nustep L 5 x 6 min In ll bars for standing on airex alt toe taps to orange cone 15 x 2 Hamstrings curls 35# 2 x 10  Leg ext 10# 2 x 10 Standing 3 way hip with 1 1/2 # x 20 each direction in ll bars High marches with 2 1/2# in ll bars 4 square one block cw and ccw stepping  Sit to stand feet on airex from hi/low mat 10x 2 Recumbent cycle L 4.5 x 5 min   1215/25 NuStep L 5x6 min 5 x sit to stand with UE, 14.02 sec HS curls 35lb 2x10 Leg Ext 10lb 2x10 6in step ups 1 rail x10 each Lateral step ups 6in x 10 each 1 rail In //bar reaching towards called out numbers  On airex  Sit to stand Le on airex mat elevated 2x10 Alt 4in box taps 2x10 SBA Heel raises     08/21/24 Sit to stands from elevated nat 2x10  Mod cues to keep LE from pushing against mat Nustep L 5 x 6 min Alt 4 inch and 6 inch box taps x10 each 30 lb resisted forward & backwards gait x4 each  Instability with backwards gait min assit LOB x1 6in step up 1 rail x10 6in lateral step up 1 rail x10  08/19/24:  Knee flexion 45# 3 x 10 Knee ext 10 # 3 x 10 Standing 3 way hip, 10 reps, alt hip abd, flex, and ext with st knee, 1 1/2 cuff wts each ankle, needed CGA for balance with hip flexion, knees buckled at times Nustep L 5 x 6 min Leg press 40# , 10 x 2 feet level, 2 x 10 feet in staggered position each leg forward In ll bars for airex taps to 3 sea urchins,  alt feet In ll bars in semi tandem standing,  leading foot on 4 step, rotational reaches with red medibar, 15 x each leg In ll bars , leading foot on center of compliant side, low amplitude lunges forward, 15 x each leg, hands on ll bars, emphasis on short , controlled, low amplitude movements  08/15/24 Nustep L 5 HEP [employer] BALANCE PACKET- some with ankle wts 3# SL kicks 3 way. Marching fwd and back,side stepping, grapevine, tandem gait and stance, SLS , toe walking and heel walking Knee ext 10# 2 sets 10 HS curl 35# 2 sets 10 Leg Press 40# 3 sets 10 -seat #9 STS on airex CGA 10 x Resisted gait 30# 5 x fwd and backward, 4 x each side  08/13/24 Nustep L 5 - almost lost balance when getting off Nustep Knee ext 10# 2 sets 10 HS curl 25# 2 sets 10 Leg Press 40# 3 sets 10 ,seat number 9 if lower unable to push up, some mild RT knee pain STS on airex from elevated mat -CGA 10 x Ball toss on airex-CGA, struggled with high balls Steps ups 4 inch on airex 10 x fwd each leg then laterally 10 x each leg CG- min  A with UE support Resisted gait 30# 5 x fwd and backward, 4 x each side // bars side stepping on foam beam and tandem with UE needed 5 x each 30#   07/31/24:   Evaluation Leg press 40# 2 x 15   PATIENT EDUCATION: 08/15/24- preformed and issued HO of [facility] Balance Program- instructed to do by solid surface ie counter top or hallway for support and he VU. Okayed 3 ankle wts for SL kicks and marching.   Education details: POC, goals Person educated: Patient Education method: Explanation, Demonstration, Tactile cues, Verbal cues, and Handouts Education comprehension: verbalized understanding and returned demonstration  HOME EXERCISE PROGRAM:TBD   GOALS: Goals reviewed with patient? Yes  SHORT TERM GOALS: Target date: 2 weeks 08/14/24  I HEP  Baseline: Goal status: 08/15/24 MET  LONG TERM GOALS: Target date: 10/23/24, 12 weeks   Berg balance score  improve from 20/56 to 36/56 or greater Baseline:  Goal status:  INITIAL  2.  5 x sit to stand improve from 14.66 with UE support, to less than 14 sec, without B knee pain, quite painful today   Baseline:  Goal status: Progressing 08/26/24  3.  Improve LE strength, hip extension and B quads to 4+/5  Baseline: 4-/5 Goal status: Progressing 09/17/23    ASSESSMENT:  CLINICAL IMPRESSION: Pt remain very unsteady with initial standing. High steppage gait pattern. He worked hard, fatigued relatively quickly.  Constant cue needed to get him to slow down and recover after each set. SBA needed with all balance inerventions  He should continue to benefit from ongoing skilled PT.     Patient is a 79 y.o. male who was evaluated today by physical therapy due to unsteady gait with falls . He demonstrates R foot drop, and poor static and dynamic standing balance.  Also weakness throughout B hips , LE's which influences his safety and ease of transitional movements. He should benefit from physical therapy for LE strengthening and balance training. Needs to utilize his cane when out of house which he states he does, however did not have with him today.  OBJECTIVE IMPAIRMENTS: decreased activity tolerance, decreased balance, decreased coordination, decreased endurance, decreased knowledge of condition, decreased knowledge of use of DME, difficulty walking, decreased strength, impaired perceived  functional ability, improper body mechanics, and postural dysfunction.   ACTIVITY LIMITATIONS: carrying, lifting, bending, standing, squatting, stairs, transfers, bed mobility, and locomotion level  PARTICIPATION LIMITATIONS: meal prep, cleaning, laundry, driving, shopping, and community activity  PERSONAL FACTORS: Age, Behavior pattern, and Fitness are also affecting patient's functional outcome.   REHAB POTENTIAL: Fair    CLINICAL DECISION MAKING: Evolving/moderate complexity  EVALUATION COMPLEXITY: Moderate  PLAN:  PT FREQUENCY: 2x/week  PT DURATION: 12 weeks  PLANNED  INTERVENTIONS: 97110-Therapeutic exercises, 97530- Therapeutic activity, 97112- Neuromuscular re-education, 97535- Self Care, [id number]- Manual therapy, and Patient/Family education  PLAN FOR NEXT SESSION: bulk LE strengthening, gait with st cane, standing functional strength and balance training  HEP  Tanda KANDICE Sorrow, PTA, 09/23/2024, 11:42 AM        "

## 2024-09-24 ENCOUNTER — Ambulatory Visit (HOSPITAL_COMMUNITY)
Admission: RE | Admit: 2024-09-24 | Discharge: 2024-09-24 | Disposition: A | Discharge status: Home / Self Care | Source: Ambulatory Visit | Attending: Thoracic Surgery (Cardiothoracic Vascular Surgery) | Admitting: Thoracic Surgery (Cardiothoracic Vascular Surgery)

## 2024-09-24 DIAGNOSIS — I7121 Aneurysm of the ascending aorta, without rupture: Secondary | ICD-10-CM | POA: Insufficient documentation

## 2024-09-25 ENCOUNTER — Ambulatory Visit: Admitting: Physical Therapy

## 2024-09-25 ENCOUNTER — Encounter: Payer: Self-pay | Admitting: Physical Therapy

## 2024-09-25 DIAGNOSIS — M6281 Muscle weakness (generalized): Secondary | ICD-10-CM

## 2024-09-25 DIAGNOSIS — R2681 Unsteadiness on feet: Secondary | ICD-10-CM | POA: Diagnosis not present

## 2024-09-25 DIAGNOSIS — R262 Difficulty in walking, not elsewhere classified: Secondary | ICD-10-CM

## 2024-09-25 DIAGNOSIS — R296 Repeated falls: Secondary | ICD-10-CM

## 2024-09-25 NOTE — Therapy (Signed)
 " OUTPATIENT PHYSICAL THERAPY NEURO   Patient Name: Timothy Moran MRN: [id number] DOB:[birth year], 79 y.o., male Today's Date: 09/25/2024   PCP: Aisha Harvey, MD REFERRING PROVIDER: same  END OF SESSION:       Past Medical History:  Diagnosis Date   Acute blood loss anemia 10/05/2020   Acute respiratory failure (HCC), hypothermia therapy, vent - extubated 10/06/20    AKI (acute kidney injury) 10/05/2020   Anoxic brain injury (HCC) 10/26/2020   Arrhythmia    Ascending aortic aneurysm 01/29/2018   a. 2019 4.3 cm by echo; b. 02/2021 Echo: Ao root 40mm.   Benign brain tumor (HCC)    Benign neoplasm of brain (HCC) 12/19/2017   Cardiac arrest with ventricular fibrillation (HCC)    Chest pain 12/04/2017   Chronic kidney disease, stage III (moderate) (HCC) 12/19/2017   CKD (chronic kidney disease), stage III - IV (HCC)    Conductive hearing loss of both ears 08/24/2018   Coronary Artery Disease    a. 09/2020 Inf STEMI complicated by cardiac arrest/PCI: LAD 50p/m, LCX 100d (2.5 x 30 Resolute Onyx DES); b. 04/2021 PCI: 04/2021 LM nl, LAD 50p/12m (2.5x30 Onyx Frontier DES), LCX 25p, 28m (RFR 0.98), 40d ISR (RFR 0.98), OM2 25, RCA mild diff dzs.   Diabetes mellitus, type 2 (HCC) 10/06/2020   10/06/20 A1C 7%   Dizziness 10/11/2019   Encounter for imaging study to confirm orogastric (OG) tube placement    Eustachian tube dysfunction 08/24/2018   Hematuria 10/26/2020   HFrEF (heart failure with reduced ejection fraction) (HCC)    a. 11/2020 Echo: EF 25-30%; b. 02/2021 Echo: EF 25-30%, glob HK. Mild LVH. Nl RV fxn. Triv AI. Ao root 40mm.   History of pulmonary embolus (PE)    HLD (hyperlipidemia) 10/26/2020   Hypertension    Iron deficiency anemia rec'd IV iron 10/26/2020   Ischemic cardiomyopathy    a. 11/2020 Echo: EF 25-30%; b. 02/2021 Echo: EF 25-30%, glob HK.   Left bundle branch block 12/19/2017   Meningioma (HCC) 08/14/2020   Nonsustained ventricular tachycardia (HCC) 10/11/2019    Pain of left hip joint 05/29/2020   Personal history of pulmonary embolism 12/19/2017   Pneumonia of both lungs due to infectious organism    Pulmonary hypertension due to thromboembolism (HCC) 03/09/2018   See echo 12/27/17 with nl PAS vs CTa chest 02/23/18    S/P angioplasty with stent 10/04/20 DES to LCX  10/26/2020   Sinus bradycardia 12/22/2017   SOB (shortness of breath)    Solitary pulmonary nodule on lung CT 03/08/2018   CT 12/04/17 1.0 x 0.8 x 0.7 cm nodular opacity in the RUL vs not seen 03/16/10 posterior segment of the right upper lobe. SABRASpirometry 03/08/2018    FEV1 3.86 (108%)  Ratio 96 s prior rx  - PET  03/13/18   Low grade c/w adenoca > rec T surgery eval     STEMI (ST elevation myocardial infarction) (HCC) 10/04/2020   Urinary retention 10/26/2020   Vestibular schwannoma (HCC) 08/14/2020   Past Surgical History:  Procedure Laterality Date   APPENDECTOMY     BIV ICD INSERTION CRT-D N/A 11/03/2021   Procedure: BIV ICD INSERTION CRT-D;  Surgeon: Inocencio Soyla Lunger, MD;  Location: [facility] INVASIVE CV LAB;  Service: Cardiovascular;  Laterality: N/A;   CARDIAC CATHETERIZATION     CORONARY ANGIOPLASTY WITH STENT PLACEMENT Left 04/12/2021   LAD stent placement   CORONARY PRESSURE/FFR STUDY N/A 04/12/2021   Procedure: INTRAVASCULAR PRESSURE WIRE/FFR STUDY;  Surgeon: End,  Lonni, MD;  Location: [facility] INVASIVE CV LAB;  Service: Cardiovascular;  Laterality: N/A;   CORONARY STENT INTERVENTION N/A 04/12/2021   Procedure: CORONARY STENT INTERVENTION;  Surgeon: Mady Lonni, MD;  Location: [facility] INVASIVE CV LAB;  Service: Cardiovascular;  Laterality: N/A;   CORONARY ULTRASOUND/IVUS N/A 04/12/2021   Procedure: Intravascular Ultrasound/IVUS;  Surgeon: Mady Lonni, MD;  Location: [facility] INVASIVE CV LAB;  Service: Cardiovascular;  Laterality: N/A;   CORONARY/GRAFT ACUTE MI REVASCULARIZATION N/A 10/04/2020   Procedure: Coronary/Graft Acute MI Revascularization;  Surgeon: Mady Lonni, MD;   Location: [facility] INVASIVE CV LAB;  Service: Cardiovascular;  Laterality: N/A;   HERNIA REPAIR     LEFT HEART CATH AND CORONARY ANGIOGRAPHY N/A 10/04/2020   Procedure: LEFT HEART CATH AND CORONARY ANGIOGRAPHY;  Surgeon: Mady Lonni, MD;  Location: [facility] INVASIVE CV LAB;  Service: Cardiovascular;  Laterality: N/A;   RIGHT/LEFT HEART CATH AND CORONARY ANGIOGRAPHY N/A 04/12/2021   Procedure: RIGHT/LEFT HEART CATH AND CORONARY ANGIOGRAPHY;  Surgeon: Mady Lonni, MD;  Location: [facility] INVASIVE CV LAB;  Service: Cardiovascular;  Laterality: N/A;   Patient Active Problem List   Diagnosis Date Noted   Anemia    Leukocytosis    Right arm weakness 01/25/2022   Right sided weakness    Fall at home 01/22/2022   Status post biventricular pacemaker - MRI compatible PPM and leads 01/22/2022   Chronic indwelling Foley catheter 01/22/2022   CKD (chronic kidney disease), stage III - IV (HCC) 06/28/2021   Chronic HFrEF (heart failure with reduced ejection fraction) (HCC) 03/31/2021   Arrhythmia    DM (diabetes mellitus), type 2 (HCC) new 10/26/2020   Urinary retention 10/26/2020   Hyperlipidemia LDL goal <70 10/26/2020   Coronary artery disease of native artery of native heart with stable angina pectoris 10/26/2020   S/P angioplasty with stent 10/04/20 DES to LCX  10/26/2020   Ischemic cardiomyopathy 10/26/2020   Diabetes mellitus, type 2 (HCC) 10/06/2020   DOE (dyspnea on exertion)    Meningioma (HCC) 08/14/2020   Vestibular schwannoma (HCC) 08/14/2020   Cardiac arrest with ventricular fibrillation (HCC)    Pain of left hip joint 05/29/2020   Benign brain tumor (HCC)    Chronic kidney disease    History of pulmonary embolus (PE)    Hypertension    Nonsustained ventricular tachycardia (HCC) 10/11/2019   Dizziness 10/11/2019   Abnormal echocardiogram 06/07/2019   Eustachian tube dysfunction 08/24/2018   Conductive hearing loss of both ears 08/24/2018   Pulmonary hypertension due to thromboembolism  (HCC) 03/09/2018   Solitary pulmonary nodule on lung CT 03/08/2018   Ascending aortic aneurysm (HCC) 4.2 cm based on CT from December 2020 01/29/2018   Sinus bradycardia 12/22/2017   Left bundle branch block 12/19/2017   Personal history of pulmonary embolism - no longer on anticoagulation due to pt refusal to take it anymore. 12/19/2017   Chronic kidney disease, stage III (moderate) (HCC) 12/19/2017   Benign neoplasm of brain (HCC) 12/19/2017    ONSET DATE: chronic, recent fall Jul 01 2024  REFERRING DIAG: unsteady gait, falls  THERAPY DIAG:  Difficulty in walking, not elsewhere classified  Unsteadiness on feet  Muscle weakness (generalized)  Repeated falls  Rationale for Evaluation and Treatment: Rehabilitation  SUBJECTIVE:  SUBJECTIVE STATEMENT: Doing ok, about the same  Pt accompanied by: self  PERTINENT HISTORY: chronic balance deficits,   PAIN:  Are you having pain? 0/10 low back PRECAUTIONS: Fall and ICD/Pacemaker  RED FLAGS: None   WEIGHT BEARING RESTRICTIONS: No  FALLS: Has patient fallen in last 6 months? Yes. Number of falls one   LIVING ENVIRONMENT: Lives with: lives with their family and lives with their spouse Lives in: House/apartment  Has following equipment at home: Single point cane and [employer]ant - 2 wheeled  PLOF: Independent with household mobility with device  PATIENT GOALS: improve overall strength  OBJECTIVE:  Note: Objective measures were completed at Evaluation unless otherwise noted.  DIAGNOSTIC FINDINGS: na  COGNITION: Overall cognitive status: Within functional limits for tasks assessed   SENSATION: WFL  COORDINATION: Diminished B  EDEMA:  None noted  POSTURE: tall , lean, wide base of support  LOWER EXTREMITY ROM:   wfl   LOWER  EXTREMITY MMT:    MMT Right Eval Left Eval Right 09/17/23 Left 09/17/23  Hip flexion 4- 4- 4- 4  Hip extension 4- 4- 4 4  Hip abduction      Hip adduction      Hip internal rotation      Hip external rotation      Knee flexion      Knee extension 4- 4- 5 5  Ankle dorsiflexion 4-/5  4 5   Ankle plantarflexion      Ankle inversion      Ankle eversion      (Blank rows = not tested)  GAIT:walked 600' in 3 min 33 sec.  Consistent R foot drop and high steppage gait on R.  Frequent sway with gait and with turns  FUNCTIONAL TESTS:  5 times sit to stand: 14.66 Berg balance score 20/56 PATIENT SURVEYS:  LEFS                                                                                                                              TREATMENT DATE:  09/25/24 NuStep L 5 x 6 min Sit to stands form elevated surface 6in step ups 1 rail x10 each  No raisl x 5 CGA 1 level more square in // bars Forward/backwards and side to side  Needs hands with forward backwards Standing OHP w/ yellow ball  Standing on airex w/ yellow bal chest press  09/23/24 NuStep L 5 x 6 min 5x sit to stand (Mat elevated) 15.31 sec In // bars  Side stap over tband with UE  Alt cone taps  Tadem walking with UE Leg press 40lb 2x10 working form lower ROM HS curls 35lb 2x12 Leg Ext 10lb 2x10  09/18/24: HS curls 35lb 2x15 Leg Ext 15lb 2x15 In ll bars for staggered standing, with 4# hand weights, for alt arm swings, large amplitude to challenge balance Sit to stand from elevated mat table feet on airex, 10 x 2 In ll bars standing on 2  step for star taps each foot, 10 x each  Nustep L 5 x 6 min Ue's and LE's  3 way hip:  2# cuff wts each ankle mass practice in ll bars Lateral step ups to 6 step with hand rail mass practice each leg   09/17/23 NuStep L 5 x 6 min HS curls 35lb 2x15 Leg Ext 15lb 2x15 S2S w/ OHP yellow ball from elevated table on airex 3x5  X5 without airex 6in step ups 1 raisl x 10 each Side  steps in // bars  On balance beam with U support.   09/09/24 Nustep L 5 x 6 min BERG 23/56 S2S from elevated table on airex 2x10 Backwards walking  HS curls 35lb 2x10 Leg Ext 15lb 2x15 6in step ups w/ UE support x10 Heel raises 2x10 from floor  09/02/24 Nustep L 5 x 6 min Slant board calf stretch  Shoulder Ext 10lb 2x10 on airex Shoulder Row 15lb 2x10 on airex HS curls 35lb 2x15 Leg Ext 10lb 2x15 S2S from elevated table on airex 2x10 Alt 4 in box taps  6in lateral step ups   08/28/24: Nustep L 5 x 6 min In ll bars for standing on airex alt toe taps to orange cone 15 x 2 Hamstrings curls 35# 2 x 10  Leg ext 10# 2 x 10 Standing 3 way hip with 1 1/2 # x 20 each direction in ll bars High marches with 2 1/2# in ll bars 4 square one block cw and ccw stepping  Sit to stand feet on airex from hi/low mat 10x 2 Recumbent cycle L 4.5 x 5 min   1215/25 NuStep L 5x6 min 5 x sit to stand with UE, 14.02 sec HS curls 35lb 2x10 Leg Ext 10lb 2x10 6in step ups 1 rail x10 each Lateral step ups 6in x 10 each 1 rail In //bar reaching towards called out numbers  On airex  Sit to stand Le on airex mat elevated 2x10 Alt 4in box taps 2x10 SBA Heel raises     08/21/24 Sit to stands from elevated nat 2x10  Mod cues to keep LE from pushing against mat Nustep L 5 x 6 min Alt 4 inch and 6 inch box taps x10 each 30 lb resisted forward & backwards gait x4 each  Instability with backwards gait min assit LOB x1 6in step up 1 rail x10 6in lateral step up 1 rail x10  08/19/24:  Knee flexion 45# 3 x 10 Knee ext 10 # 3 x 10 Standing 3 way hip, 10 reps, alt hip abd, flex, and ext with st knee, 1 1/2 cuff wts each ankle, needed CGA for balance with hip flexion, knees buckled at times Nustep L 5 x 6 min Leg press 40# , 10 x 2 feet level, 2 x 10 feet in staggered position each leg forward In ll bars for airex taps to 3 sea urchins, alt feet In ll bars in semi tandem standing, leading foot on  4 step, rotational reaches with red medibar, 15 x each leg In ll bars , leading foot on center of compliant side, low amplitude lunges forward, 15 x each leg, hands on ll bars, emphasis on short , controlled, low amplitude movements  08/15/24 Nustep L 5 HEP [employer] BALANCE PACKET- some with ankle wts 3# SL kicks 3 way. Marching fwd and back,side stepping, grapevine, tandem gait and stance, SLS , toe walking and heel walking Knee ext 10# 2 sets 10 HS curl 35# 2  sets 10 Leg Press 40# 3 sets 10 -seat #9 STS on airex CGA 10 x Resisted gait 30# 5 x fwd and backward, 4 x each side    PATIENT EDUCATION: 08/15/24- preformed and issued HO of [facility] Balance Program- instructed to do by solid surface ie counter top or hallway for support and he VU. Okayed 3 ankle wts for SL kicks and marching.   Education details: POC, goals Person educated: Patient Education method: Explanation, Demonstration, Tactile cues, Verbal cues, and Handouts Education comprehension: verbalized understanding and returned demonstration  HOME EXERCISE PROGRAM:TBD   GOALS: Goals reviewed with patient? Yes  SHORT TERM GOALS: Target date: 2 weeks 08/14/24  I HEP  Baseline: Goal status: 08/15/24 MET  LONG TERM GOALS: Target date: 10/23/24, 12 weeks   Berg balance score  improve from 20/56 to 36/56 or greater Baseline:  Goal status: 23/56 09/09/25  2.  5 x sit to stand improve from 14.66 with UE support, to less than 14 sec, without B knee pain, quite painful today   Baseline:  Goal status: Progressing 09/23/24  3.  Improve LE strength, hip extension and B quads to 4+/5  Baseline: 4-/5 Goal status: Progressing 09/17/23    ASSESSMENT:  CLINICAL IMPRESSION: Pt remain very unsteady with initial standing. High steppage gait pattern. He works hard and fatigued relatively quickly.  Constant cue needed to get him to slow down and recover after each set. SBA to CGA needed with all balance interventions.  He should  continue to benefit from ongoing skilled PT.     Patient is a 79 y.o. male who was evaluated today by physical therapy due to unsteady gait with falls . He demonstrates R foot drop, and poor static and dynamic standing balance.  Also weakness throughout B hips , LE's which influences his safety and ease of transitional movements. He should benefit from physical therapy for LE strengthening and balance training. Needs to utilize his cane when out of house which he states he does, however did not have with him today.  OBJECTIVE IMPAIRMENTS: decreased activity tolerance, decreased balance, decreased coordination, decreased endurance, decreased knowledge of condition, decreased knowledge of use of DME, difficulty walking, decreased strength, impaired perceived functional ability, improper body mechanics, and postural dysfunction.   ACTIVITY LIMITATIONS: carrying, lifting, bending, standing, squatting, stairs, transfers, bed mobility, and locomotion level  PARTICIPATION LIMITATIONS: meal prep, cleaning, laundry, driving, shopping, and community activity  PERSONAL FACTORS: Age, Behavior pattern, and Fitness are also affecting patient's functional outcome.   REHAB POTENTIAL: Fair    CLINICAL DECISION MAKING: Evolving/moderate complexity  EVALUATION COMPLEXITY: Moderate  PLAN:  PT FREQUENCY: 2x/week  PT DURATION: 12 weeks  PLANNED INTERVENTIONS: 97110-Therapeutic exercises, 97530- Therapeutic activity, 97112- Neuromuscular re-education, 97535- Self Care, [id number]- Manual therapy, and Patient/Family education  PLAN FOR NEXT SESSION: bulk LE strengthening, gait with st cane, standing functional strength and balance training  HEP  Tanda KANDICE Sorrow, PTA, 09/25/2024, 11:44 AM        "

## 2024-10-07 ENCOUNTER — Ambulatory Visit: Admitting: Physical Therapy

## 2024-10-08 ENCOUNTER — Ambulatory Visit

## 2024-10-09 ENCOUNTER — Ambulatory Visit: Admitting: Physical Therapy

## 2024-10-09 ENCOUNTER — Encounter: Payer: Self-pay | Admitting: Physical Therapy

## 2024-10-09 DIAGNOSIS — M6281 Muscle weakness (generalized): Secondary | ICD-10-CM

## 2024-10-09 DIAGNOSIS — R262 Difficulty in walking, not elsewhere classified: Secondary | ICD-10-CM

## 2024-10-09 DIAGNOSIS — R2681 Unsteadiness on feet: Secondary | ICD-10-CM

## 2024-10-09 DIAGNOSIS — R296 Repeated falls: Secondary | ICD-10-CM

## 2024-10-09 NOTE — Therapy (Signed)
 " OUTPATIENT PHYSICAL THERAPY NEURO   Patient Name: Timothy Moran MRN: [id number] DOB:1945/09/14, 79 y.o., male Today's Date: 10/09/2024   PCP: Aisha Harvey, MD REFERRING PROVIDER: same  END OF SESSION:  PT End of Session - 10/09/24 1059     Visit Number 13    Date for Recertification  10/23/24    PT Start Time 1057    PT Stop Time 1142    PT Time Calculation (min) 45 min    Activity Tolerance Patient tolerated treatment well    Behavior During Therapy [facility] for tasks assessed/performed              Past Medical History:  Diagnosis Date   Acute blood loss anemia 10/05/2020   Acute respiratory failure (HCC), hypothermia therapy, vent - extubated 10/06/20    AKI (acute kidney injury) 10/05/2020   Anoxic brain injury (HCC) 10/26/2020   Arrhythmia    Ascending aortic aneurysm 01/29/2018   a. 2019 4.3 cm by echo; b. 02/2021 Echo: Ao root 40mm.   Benign brain tumor (HCC)    Benign neoplasm of brain (HCC) 12/19/2017   Cardiac arrest with ventricular fibrillation (HCC)    Chest pain 12/04/2017   Chronic kidney disease, stage III (moderate) (HCC) 12/19/2017   CKD (chronic kidney disease), stage III - IV (HCC)    Conductive hearing loss of both ears 08/24/2018   Coronary Artery Disease    a. 09/2020 Inf STEMI complicated by cardiac arrest/PCI: LAD 50p/m, LCX 100d (2.5 x 30 Resolute Onyx DES); b. 04/2021 PCI: 04/2021 LM nl, LAD 50p/23m (2.5x30 Onyx Frontier DES), LCX 25p, 20m (RFR 0.98), 40d ISR (RFR 0.98), OM2 25, RCA mild diff dzs.   Diabetes mellitus, type 2 (HCC) 10/06/2020   10/06/20 A1C 7%   Dizziness 10/11/2019   Encounter for imaging study to confirm orogastric (OG) tube placement    Eustachian tube dysfunction 08/24/2018   Hematuria 10/26/2020   HFrEF (heart failure with reduced ejection fraction) (HCC)    a. 11/2020 Echo: EF 25-30%; b. 02/2021 Echo: EF 25-30%, glob HK. Mild LVH. Nl RV fxn. Triv AI. Ao root 40mm.   History of pulmonary embolus (PE)    HLD  (hyperlipidemia) 10/26/2020   Hypertension    Iron deficiency anemia rec'd IV iron 10/26/2020   Ischemic cardiomyopathy    a. 11/2020 Echo: EF 25-30%; b. 02/2021 Echo: EF 25-30%, glob HK.   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 SABRASpirometry 03/08/2018    FEV1 3.86 (108%)  Ratio 96 s prior rx  - PET  03/13/18   Low grade c/w adenoca > rec T surgery eval     STEMI (ST elevation myocardial infarction) (HCC) 10/04/2020   Urinary retention 10/26/2020   Vestibular schwannoma (HCC) 08/14/2020   Past Surgical History:  Procedure Laterality Date   APPENDECTOMY  BIV ICD INSERTION CRT-D N/A 11/03/2021   Procedure: BIV ICD INSERTION CRT-D;  Surgeon: Inocencio Soyla Lunger, MD;  Location: [facility] INVASIVE CV LAB;  Service: Cardiovascular;  Laterality: N/A;   CARDIAC CATHETERIZATION     CORONARY ANGIOPLASTY WITH STENT PLACEMENT Left 04/12/2021   LAD stent placement   CORONARY PRESSURE/FFR STUDY N/A 04/12/2021   Procedure: INTRAVASCULAR PRESSURE WIRE/FFR STUDY;  Surgeon: Mady Bruckner, MD;  Location: [facility] INVASIVE CV LAB;  Service: Cardiovascular;  Laterality: N/A;   CORONARY STENT INTERVENTION N/A 04/12/2021   Procedure: CORONARY STENT INTERVENTION;  Surgeon: Mady Bruckner, MD;  Location: [facility] INVASIVE CV LAB;  Service: Cardiovascular;  Laterality: N/A;   CORONARY ULTRASOUND/IVUS N/A  04/12/2021   Procedure: Intravascular Ultrasound/IVUS;  Surgeon: Mady Bruckner, MD;  Location: [facility] INVASIVE CV LAB;  Service: Cardiovascular;  Laterality: N/A;   CORONARY/GRAFT ACUTE MI REVASCULARIZATION N/A 10/04/2020   Procedure: Coronary/Graft Acute MI Revascularization;  Surgeon: Mady Bruckner, MD;  Location: [facility] INVASIVE CV LAB;  Service: Cardiovascular;  Laterality: N/A;   HERNIA REPAIR     LEFT HEART CATH AND CORONARY ANGIOGRAPHY N/A 10/04/2020   Procedure: LEFT HEART CATH AND CORONARY ANGIOGRAPHY;  Surgeon: Mady Bruckner, MD;  Location: [facility] INVASIVE CV LAB;  Service: Cardiovascular;  Laterality: N/A;   RIGHT/LEFT HEART CATH AND CORONARY ANGIOGRAPHY N/A 04/12/2021   Procedure: RIGHT/LEFT HEART CATH AND CORONARY ANGIOGRAPHY;  Surgeon: Mady Bruckner, MD;  Location: [facility] INVASIVE CV LAB;  Service: Cardiovascular;  Laterality: N/A;   Patient Active Problem List   Diagnosis Date Noted   Anemia    Leukocytosis    Right arm weakness 01/25/2022   Right sided weakness    Fall at home 01/22/2022   Status post biventricular pacemaker - MRI compatible PPM and leads 01/22/2022   Chronic indwelling Foley catheter 01/22/2022   CKD (chronic kidney disease), stage III - IV (HCC) 06/28/2021   Chronic HFrEF (heart failure with reduced ejection fraction) (HCC) 03/31/2021   Arrhythmia    DM (diabetes mellitus), type 2 (HCC) new 10/26/2020   Urinary retention 10/26/2020   Hyperlipidemia LDL goal <70 10/26/2020   Coronary artery disease of native artery of native heart with stable angina pectoris 10/26/2020   S/P angioplasty with stent 10/04/20 DES to LCX  10/26/2020   Ischemic cardiomyopathy 10/26/2020   Diabetes mellitus, type 2 (HCC) 10/06/2020   DOE (dyspnea on exertion)    Meningioma (HCC) 08/14/2020   Vestibular schwannoma (HCC) 08/14/2020   Cardiac arrest with ventricular fibrillation (HCC)    Pain of left hip joint 05/29/2020   Benign brain tumor (HCC)    Chronic kidney disease     History of pulmonary embolus (PE)    Hypertension    Nonsustained ventricular tachycardia (HCC) 10/11/2019   Dizziness 10/11/2019   Abnormal echocardiogram 06/07/2019   Eustachian tube dysfunction 08/24/2018   Conductive hearing loss of both ears 08/24/2018   Pulmonary hypertension due to thromboembolism (HCC) 03/09/2018   Solitary pulmonary nodule on lung CT 03/08/2018   Ascending aortic aneurysm (HCC) 4.2 cm based on CT from December 2020 01/29/2018   Sinus bradycardia 12/22/2017   Left bundle branch block 12/19/2017   Personal history of pulmonary embolism - no longer on anticoagulation due to pt refusal to take it anymore. 12/19/2017   Chronic kidney disease, stage III (moderate) (HCC) 12/19/2017   Benign neoplasm of brain (HCC) 12/19/2017    ONSET DATE: chronic, recent fall Jul 01 2024  REFERRING DIAG: unsteady gait, falls  THERAPY DIAG:  Difficulty in walking, not  elsewhere classified  Unsteadiness on feet  Muscle weakness (generalized)  Repeated falls  Rationale for Evaluation and Treatment: Rehabilitation  SUBJECTIVE:                                                                                                                                                                                             SUBJECTIVE STATEMENT: Getting more dizzy than he use too, not being as active as he shoulds  Pt accompanied by: self  PERTINENT HISTORY: chronic balance deficits,   PAIN:  Are you having pain? 0/10 low back PRECAUTIONS: Fall and ICD/Pacemaker  RED FLAGS: None   WEIGHT BEARING RESTRICTIONS: No  FALLS: Has patient fallen in last 6 months? Yes. Number of falls one   LIVING ENVIRONMENT: Lives with: lives with their family and lives with their spouse Lives in: House/apartment  Has following equipment at home: Single point cane and Environmental Consultant - 2 wheeled  PLOF: Independent with household mobility with device  PATIENT GOALS: improve overall strength  OBJECTIVE:   Note: Objective measures were completed at Evaluation unless otherwise noted.  DIAGNOSTIC FINDINGS: na  COGNITION: Overall cognitive status: Within functional limits for tasks assessed   SENSATION: WFL  COORDINATION: Diminished B  EDEMA:  None noted  POSTURE: tall , lean, wide base of support  LOWER EXTREMITY ROM:   wfl   LOWER EXTREMITY MMT:    MMT Right Eval Left Eval Right 09/17/23 Left 09/17/23 Right 10/09/24 Left 10/09/24  Hip flexion 4- 4- 4- 4 4+ 5  Hip extension 4- 4- 4 4 5 5   Hip abduction        Hip adduction        Hip internal rotation        Hip external rotation        Knee flexion        Knee extension 4- 4- 5 5 5 5   Ankle dorsiflexion 4-/5  4 5 5 5   Ankle plantarflexion        Ankle inversion        Ankle eversion        (Blank rows = not tested)  GAIT:walked 600' in 3 min 33 sec.  Consistent R foot drop and high steppage gait on R.  Frequent sway with gait and with turns  FUNCTIONAL TESTS:  5 times sit to stand: 14.66 Berg balance score 20/56 PATIENT SURVEYS:  LEFS  TREATMENT DATE:  10/09/24 NuStep L 5 x 6 min Goals  BERG 28/56 HS curls 35lb 2x15 Leg Ext 10lb 2x15 Sit to stand w/ yellow ball chest press 2x10 4in step ups no rail 2x5 CGA  09/25/24 NuStep L 5 x 6 min Sit to stands form elevated surface 6in step ups 1 rail x10 each  No raisl x 5 CGA 1 level more square in // bars Forward/backwards and side to side  Needs hands with forward backwards Standing OHP w/ yellow ball  Standing on airex w/ yellow bal chest press  09/23/24 NuStep L 5 x 6 min 5x sit to stand (Mat elevated) 15.31 sec In // bars  Side stap over tband with UE  Alt cone taps  Tadem walking with UE Leg press 40lb 2x10 working form lower ROM HS curls 35lb 2x12 Leg Ext 10lb 2x10  09/18/24: HS curls 35lb 2x15 Leg Ext 15lb 2x15 In ll bars for  staggered standing, with 4# hand weights, for alt arm swings, large amplitude to challenge balance Sit to stand from elevated mat table feet on airex, 10 x 2 In ll bars standing on 2 step for star taps each foot, 10 x each  Nustep L 5 x 6 min Ue's and LE's  3 way hip:  2# cuff wts each ankle mass practice in ll bars Lateral step ups to 6 step with hand rail mass practice each leg   09/17/23 NuStep L 5 x 6 min HS curls 35lb 2x15 Leg Ext 15lb 2x15 S2S w/ OHP yellow ball from elevated table on airex 3x5  X5 without airex 6in step ups 1 raisl x 10 each Side steps in // bars  On balance beam with U support.   09/09/24 Nustep L 5 x 6 min BERG 23/56 S2S from elevated table on airex 2x10 Backwards walking  HS curls 35lb 2x10 Leg Ext 15lb 2x15 6in step ups w/ UE support x10 Heel raises 2x10 from floor  09/02/24 Nustep L 5 x 6 min Slant board calf stretch  Shoulder Ext 10lb 2x10 on airex Shoulder Row 15lb 2x10 on airex HS curls 35lb 2x15 Leg Ext 10lb 2x15 S2S from elevated table on airex 2x10 Alt 4 in box taps  6in lateral step ups   08/28/24: Nustep L 5 x 6 min In ll bars for standing on airex alt toe taps to orange cone 15 x 2 Hamstrings curls 35# 2 x 10  Leg ext 10# 2 x 10 Standing 3 way hip with 1 1/2 # x 20 each direction in ll bars High marches with 2 1/2# in ll bars 4 square one block cw and ccw stepping  Sit to stand feet on airex from hi/low mat 10x 2 Recumbent cycle L 4.5 x 5 min   1215/25 NuStep L 5x6 min 5 x sit to stand with UE, 14.02 sec HS curls 35lb 2x10 Leg Ext 10lb 2x10 6in step ups 1 rail x10 each Lateral step ups 6in x 10 each 1 rail In //bar reaching towards called out numbers  On airex  Sit to stand Le on airex mat elevated 2x10 Alt 4in box taps 2x10 SBA Heel raises     08/21/24 Sit to stands from elevated nat 2x10  Mod cues to keep LE from pushing against mat Nustep L 5 x 6 min Alt 4 inch and 6 inch box taps x10 each 30 lb resisted  forward & backwards gait x4 each  Instability with backwards gait min assit LOB x1  6in step up 1 rail x10 6in lateral step up 1 rail x10  08/19/24:  Knee flexion 45# 3 x 10 Knee ext 10 # 3 x 10 Standing 3 way hip, 10 reps, alt hip abd, flex, and ext with st knee, 1 1/2 cuff wts each ankle, needed CGA for balance with hip flexion, knees buckled at times Nustep L 5 x 6 min Leg press 40# , 10 x 2 feet level, 2 x 10 feet in staggered position each leg forward In ll bars for airex taps to 3 sea urchins, alt feet In ll bars in semi tandem standing, leading foot on 4 step, rotational reaches with red medibar, 15 x each leg In ll bars , leading foot on center of compliant side, low amplitude lunges forward, 15 x each leg, hands on ll bars, emphasis on short , controlled, low amplitude movements  08/15/24 Nustep L 5 HEP CONE BALANCE PACKET- some with ankle wts 3# SL kicks 3 way. Marching fwd and back,side stepping, grapevine, tandem gait and stance, SLS , toe walking and heel walking Knee ext 10# 2 sets 10 HS curl 35# 2 sets 10 Leg Press 40# 3 sets 10 -seat #9 STS on airex CGA 10 x Resisted gait 30# 5 x fwd and backward, 4 x each side    PATIENT EDUCATION: 08/15/24- preformed and issued HO of [facility] Balance Program- instructed to do by solid surface ie counter top or hallway for support and he VU. Okayed 3 ankle wts for SL kicks and marching.   Education details: POC, goals Person educated: Patient Education method: Explanation, Demonstration, Tactile cues, Verbal cues, and Handouts Education comprehension: verbalized understanding and returned demonstration  HOME EXERCISE PROGRAM:TBD   GOALS: Goals reviewed with patient? Yes  SHORT TERM GOALS: Target date: 2 weeks 08/14/24  I HEP  Baseline: Goal status: 08/15/24 MET  LONG TERM GOALS: Target date: 10/23/24, 12 weeks   Berg balance score  improve from 20/56 to 36/56 or greater Baseline:  Goal status: 23/56 09/09/25  2.  5 x  sit to stand improve from 14.66 with UE support, to less than 14 sec, without B knee pain, quite painful today   Baseline:  Goal status: Progressing 09/23/24  3.  Improve LE strength, hip extension and B quads to 4+/5  Baseline: 4-/5 Goal status: Progressing 09/17/23, Met 10/09/24    ASSESSMENT:  CLINICAL IMPRESSION: Pt remain very unsteady with initial standing. High steppage gait pattern. Some improvement has been made increasing his LE strength and BERG balance score. He works hard and fatigued relatively quickly.  Constant cue needed to get him to slow down and recover after each set. SBA to CGA needed with all balance interventions.  He should continue to benefit from ongoing skilled PT.     Patient is a 79 y.o. male who was evaluated today by physical therapy due to unsteady gait with falls . He demonstrates R foot drop, and poor static and dynamic standing balance.  Also weakness throughout B hips , LE's which influences his safety and ease of transitional movements. He should benefit from physical therapy for LE strengthening and balance training. Needs to utilize his cane when out of house which he states he does, however did not have with him today.  OBJECTIVE IMPAIRMENTS: decreased activity tolerance, decreased balance, decreased coordination, decreased endurance, decreased knowledge of condition, decreased knowledge of use of DME, difficulty walking, decreased strength, impaired perceived functional ability, improper body mechanics, and postural dysfunction.   ACTIVITY  LIMITATIONS: carrying, lifting, bending, standing, squatting, stairs, transfers, bed mobility, and locomotion level  PARTICIPATION LIMITATIONS: meal prep, cleaning, laundry, driving, shopping, and community activity  PERSONAL FACTORS: Age, Behavior pattern, and Fitness are also affecting patient's functional outcome.   REHAB POTENTIAL: Fair    CLINICAL DECISION MAKING: Evolving/moderate complexity  EVALUATION  COMPLEXITY: Moderate  PLAN:  PT FREQUENCY: 2x/week  PT DURATION: 12 weeks  PLANNED INTERVENTIONS: 97110-Therapeutic exercises, 97530- Therapeutic activity, 97112- Neuromuscular re-education, 97535- Self Care, [id number]- Manual therapy, and Patient/Family education  PLAN FOR NEXT SESSION: bulk LE strengthening, gait with st cane, standing functional strength and balance training  HEP  Tanda KANDICE Sorrow, PTA, 10/09/2024, 11:00 AM        "

## 2024-10-11 ENCOUNTER — Other Ambulatory Visit: Payer: Self-pay | Admitting: Cardiology

## 2024-10-14 ENCOUNTER — Ambulatory Visit: Admitting: Physical Therapy

## 2024-10-14 NOTE — Progress Notes (Unsigned)
" ° ° °   °  [address] [place] [address]             [phone]            Kinnie Kaupp [id number] October 05, 1945   History of Present Illness:      Medications Ordered Prior to Encounter[1]   ROS:   There were no vitals taken for this visit.    Imaging:      A/P:      Risk Modification:  Statin:  ***  Smoking cessation instruction/counseling given:  {CHL AMB [facility] SMOKING CESSATION COUNSELING:20758}  Patient was counseled on importance of Blood Pressure Control  They are instructed to contact their Primary Care Physician if they start to have blood pressure readings over 130s/90s. Do not ever stop blood pressure medications on your own, unless instructed by healthcare professional.  Please avoid use of Fluoroquinolones as this can potentially increase your risk of Aortic Rupture and/or Dissection  Patient educated on signs and symptoms of Aortic Dissection, handout also provided in AVS  Manuelita CHRISTELLA Rough, PA-C 10/14/24     [1]  Current Outpatient Medications on File Prior to Visit  Medication Sig Dispense Refill   acetaminophen  (TYLENOL ) 325 MG tablet Take 1-2 tablets (325-650 mg total) by mouth every 4 (four) hours as needed for mild pain.     amiodarone  (PACERONE ) 200 MG tablet TAKE 1/2 TABLET BY MOUTH DAILY 45 tablet 2   aspirin  EC 81 MG tablet Take 81 mg by mouth every other day. Swallow whole.     atorvastatin  (LIPITOR ) 80 MG tablet TAKE 1 TABLET BY MOUTH EVERY DAY 90 tablet 1   B Complex-C (SUPER B COMPLEX PO) Take 1 tablet by mouth daily. Unknown strength     Cholecalciferol 125 MCG (5000 UT) TABS Take 1 tablet by mouth daily.     diclofenac  Sodium (VOLTAREN ) 1 % GEL Apply 4 g topically 4 (four) times daily. 100 g 0   fluticasone  (FLONASE ) 50 MCG/ACT nasal spray Place 1-2 sprays into both nostrils at bedtime.  2   hydrALAZINE  (APRESOLINE ) 10 MG tablet Take 10 mg by mouth 3 (three) times daily.     isosorbide   dinitrate (ISORDIL ) 30 MG tablet Take 1 tablet (30 mg total) by mouth 2 (two) times daily. Pt needs office visit before future refills. 60 tablet 0   melatonin 5 MG TABS Take 1 tablet (5 mg total) by mouth at bedtime. 30 tablet 0   metoprolol  succinate (TOPROL -XL) 50 MG 24 hr tablet Take 1 tablet by mouth daily.     Multiple Vitamin (MULTIVITAMIN WITH MINERALS) TABS tablet Take 1 tablet by mouth daily.     rosuvastatin  (CRESTOR ) 10 MG tablet Take 1 tablet by mouth daily.     senna-docusate (SENOKOT-S) 8.6-50 MG tablet Take 1 tablet by mouth at bedtime as needed for mild constipation.     sodium bicarbonate  650 MG tablet Take 650 mg by mouth 2 (two) times daily.     No current facility-administered medications on file prior to visit.   "

## 2024-10-15 ENCOUNTER — Ambulatory Visit

## 2024-10-15 DIAGNOSIS — I7121 Aneurysm of the ascending aorta, without rupture: Secondary | ICD-10-CM

## 2024-10-16 ENCOUNTER — Ambulatory Visit: Admitting: Physical Therapy

## 2024-10-16 NOTE — Patient Instructions (Signed)
 Risk Modification in those with ascending thoracic aortic aneurysm:   Continue control of blood pressure (prefer BP 130/80 or less)   2. Avoid fluoroquinolone antibiotics (I.e Ciprofloxacin , Avelox, Levofloxacin , Ofloxacin)   3.  Use of statin (to decrease cardiovascular risk)   4.  Exercise and activity limitations is individualized, but in general, contact sports are to be avoided and one should avoid heavy lifting (defined as half of ideal body weight) and exercises involving sustained Valsalva maneuver.   5.  Follow-up in one year with CTA chest.

## 2024-10-21 ENCOUNTER — Ambulatory Visit

## 2024-10-23 ENCOUNTER — Ambulatory Visit

## 2024-11-05 ENCOUNTER — Ambulatory Visit: Payer: Medicare Other

## 2025-02-04 ENCOUNTER — Ambulatory Visit: Payer: Medicare Other

## 2025-05-06 ENCOUNTER — Ambulatory Visit: Payer: Medicare Other
